# Patient Record
Sex: Female | Born: 1945 | Race: White | Hispanic: No | Smoking: Former smoker
Health system: Southern US, Community
[De-identification: ages and names within clinical notes are randomized; demographics above are authoritative.]

## PROBLEM LIST (undated history)

## (undated) DIAGNOSIS — F329 Major depressive disorder, single episode, unspecified: Secondary | ICD-10-CM

## (undated) DIAGNOSIS — J189 Pneumonia, unspecified organism: Secondary | ICD-10-CM

## (undated) DIAGNOSIS — E66812 Obesity, class 2: Secondary | ICD-10-CM

## (undated) DIAGNOSIS — IMO0002 Reserved for concepts with insufficient information to code with codable children: Secondary | ICD-10-CM

## (undated) DIAGNOSIS — E782 Mixed hyperlipidemia: Secondary | ICD-10-CM

## (undated) DIAGNOSIS — N186 End stage renal disease: Secondary | ICD-10-CM

## (undated) DIAGNOSIS — G629 Polyneuropathy, unspecified: Secondary | ICD-10-CM

## (undated) DIAGNOSIS — Z5189 Encounter for other specified aftercare: Secondary | ICD-10-CM

## (undated) DIAGNOSIS — I509 Heart failure, unspecified: Secondary | ICD-10-CM

## (undated) DIAGNOSIS — E539 Vitamin B deficiency, unspecified: Secondary | ICD-10-CM

## (undated) DIAGNOSIS — G8929 Other chronic pain: Secondary | ICD-10-CM

## (undated) DIAGNOSIS — H269 Unspecified cataract: Secondary | ICD-10-CM

## (undated) DIAGNOSIS — D509 Iron deficiency anemia, unspecified: Secondary | ICD-10-CM

## (undated) DIAGNOSIS — R0600 Dyspnea, unspecified: Secondary | ICD-10-CM

## (undated) DIAGNOSIS — K449 Diaphragmatic hernia without obstruction or gangrene: Secondary | ICD-10-CM

## (undated) DIAGNOSIS — R0902 Hypoxemia: Secondary | ICD-10-CM

## (undated) DIAGNOSIS — E669 Obesity, unspecified: Secondary | ICD-10-CM

## (undated) DIAGNOSIS — D649 Anemia, unspecified: Secondary | ICD-10-CM

## (undated) DIAGNOSIS — I10 Essential (primary) hypertension: Secondary | ICD-10-CM

## (undated) DIAGNOSIS — M810 Age-related osteoporosis without current pathological fracture: Secondary | ICD-10-CM

## (undated) DIAGNOSIS — J449 Chronic obstructive pulmonary disease, unspecified: Secondary | ICD-10-CM

## (undated) DIAGNOSIS — E1129 Type 2 diabetes mellitus with other diabetic kidney complication: Secondary | ICD-10-CM

## (undated) DIAGNOSIS — K589 Irritable bowel syndrome without diarrhea: Secondary | ICD-10-CM

## (undated) DIAGNOSIS — E785 Hyperlipidemia, unspecified: Secondary | ICD-10-CM

## (undated) DIAGNOSIS — T8859XA Other complications of anesthesia, initial encounter: Secondary | ICD-10-CM

## (undated) DIAGNOSIS — I2510 Atherosclerotic heart disease of native coronary artery without angina pectoris: Secondary | ICD-10-CM

## (undated) DIAGNOSIS — I959 Hypotension, unspecified: Secondary | ICD-10-CM

## (undated) DIAGNOSIS — I499 Cardiac arrhythmia, unspecified: Secondary | ICD-10-CM

## (undated) DIAGNOSIS — K219 Gastro-esophageal reflux disease without esophagitis: Secondary | ICD-10-CM

## (undated) DIAGNOSIS — C539 Malignant neoplasm of cervix uteri, unspecified: Secondary | ICD-10-CM

## (undated) DIAGNOSIS — E119 Type 2 diabetes mellitus without complications: Secondary | ICD-10-CM

## (undated) DIAGNOSIS — M5416 Radiculopathy, lumbar region: Secondary | ICD-10-CM

## (undated) DIAGNOSIS — Z992 Dependence on renal dialysis: Secondary | ICD-10-CM

## (undated) DIAGNOSIS — G4733 Obstructive sleep apnea (adult) (pediatric): Secondary | ICD-10-CM

## (undated) DIAGNOSIS — G4730 Sleep apnea, unspecified: Secondary | ICD-10-CM

## (undated) DIAGNOSIS — M549 Dorsalgia, unspecified: Secondary | ICD-10-CM

## (undated) DIAGNOSIS — C569 Malignant neoplasm of unspecified ovary: Secondary | ICD-10-CM

## (undated) DIAGNOSIS — E538 Deficiency of other specified B group vitamins: Secondary | ICD-10-CM

## (undated) DIAGNOSIS — Z8719 Personal history of other diseases of the digestive system: Secondary | ICD-10-CM

## (undated) DIAGNOSIS — N189 Chronic kidney disease, unspecified: Secondary | ICD-10-CM

## (undated) DIAGNOSIS — I480 Paroxysmal atrial fibrillation: Secondary | ICD-10-CM

## (undated) HISTORY — DX: Iron deficiency anemia, unspecified: D50.9

## (undated) HISTORY — DX: Gastro-esophageal reflux disease without esophagitis: K21.9

## (undated) HISTORY — DX: Sleep apnea, unspecified: G47.30

## (undated) HISTORY — DX: Unspecified cataract: H26.9

## (undated) HISTORY — PX: BACK SURGERY: SHX140

## (undated) HISTORY — PX: ABDOMINAL HYSTERECTOMY: SHX81

## (undated) HISTORY — DX: Chronic obstructive pulmonary disease, unspecified: J44.9

## (undated) HISTORY — DX: Atherosclerotic heart disease of native coronary artery without angina pectoris: I25.10

## (undated) HISTORY — PX: KNEE SURGERY: SHX244

## (undated) HISTORY — DX: Vitamin B deficiency, unspecified: E53.9

## (undated) HISTORY — DX: Encounter for other specified aftercare: Z51.89

## (undated) HISTORY — DX: Personal history of other diseases of the digestive system: Z87.19

## (undated) HISTORY — DX: Malignant neoplasm of unspecified ovary: C56.9

## (undated) HISTORY — PX: TONSILLECTOMY AND ADENOIDECTOMY: SUR1326

## (undated) HISTORY — DX: Mixed hyperlipidemia: E78.2

## (undated) HISTORY — DX: Essential (primary) hypertension: I10

## (undated) HISTORY — DX: Type 2 diabetes mellitus without complications: E11.9

## (undated) HISTORY — PX: SPINE SURGERY: SHX786

## (undated) HISTORY — PX: CHOLECYSTECTOMY: SHX55

## (undated) HISTORY — DX: Chronic kidney disease, unspecified: N18.9

## (undated) HISTORY — DX: Hypoxemia: R09.02

## (undated) HISTORY — DX: Deficiency of other specified B group vitamins: E53.8

## (undated) HISTORY — DX: Heart failure, unspecified: I50.9

## (undated) HISTORY — DX: Reserved for concepts with insufficient information to code with codable children: IMO0002

## (undated) HISTORY — DX: Polyneuropathy, unspecified: G62.9

## (undated) HISTORY — PX: OTHER SURGICAL HISTORY: SHX169

## (undated) HISTORY — DX: Age-related osteoporosis without current pathological fracture: M81.0

## (undated) HISTORY — DX: Diaphragmatic hernia without obstruction or gangrene: K44.9

---
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
## 1976-08-05 DIAGNOSIS — C539 Malignant neoplasm of cervix uteri, unspecified: Secondary | ICD-10-CM

## 1976-08-05 DIAGNOSIS — C569 Malignant neoplasm of unspecified ovary: Secondary | ICD-10-CM

## 1976-08-05 HISTORY — PX: PARTIAL HYSTERECTOMY: SHX80

## 1976-08-05 HISTORY — DX: Malignant neoplasm of cervix uteri, unspecified: C53.9

## 1976-08-05 HISTORY — DX: Malignant neoplasm of unspecified ovary: C56.9

## 1999-04-20 ENCOUNTER — Other Ambulatory Visit: Admission: RE | Admit: 1999-04-20 | Discharge: 1999-04-20 | Payer: Self-pay | Admitting: Obstetrics and Gynecology

## 1999-06-21 ENCOUNTER — Ambulatory Visit: Admission: RE | Admit: 1999-06-21 | Discharge: 1999-06-21 | Payer: Self-pay | Admitting: Otolaryngology

## 1999-07-11 ENCOUNTER — Encounter: Payer: Self-pay | Admitting: Otolaryngology

## 1999-07-12 ENCOUNTER — Ambulatory Visit (HOSPITAL_COMMUNITY): Admission: RE | Admit: 1999-07-12 | Discharge: 1999-07-13 | Payer: Self-pay | Admitting: Otolaryngology

## 2000-02-08 ENCOUNTER — Encounter: Payer: Self-pay | Admitting: Family Medicine

## 2000-02-08 ENCOUNTER — Inpatient Hospital Stay (HOSPITAL_COMMUNITY): Admission: EM | Admit: 2000-02-08 | Discharge: 2000-02-11 | Payer: Self-pay | Admitting: Emergency Medicine

## 2000-02-09 ENCOUNTER — Encounter: Payer: Self-pay | Admitting: Emergency Medicine

## 2000-02-13 ENCOUNTER — Encounter: Admission: RE | Admit: 2000-02-13 | Discharge: 2000-02-13 | Payer: Self-pay | Admitting: Sports Medicine

## 2000-03-04 ENCOUNTER — Ambulatory Visit (HOSPITAL_COMMUNITY): Admission: RE | Admit: 2000-03-04 | Discharge: 2000-03-04 | Payer: Self-pay | Admitting: Cardiology

## 2000-04-08 ENCOUNTER — Encounter: Admission: RE | Admit: 2000-04-08 | Discharge: 2000-07-07 | Payer: Self-pay | Admitting: Family Medicine

## 2000-05-23 ENCOUNTER — Encounter: Admission: RE | Admit: 2000-05-23 | Discharge: 2000-05-23 | Payer: Self-pay | Admitting: Family Medicine

## 2000-05-23 ENCOUNTER — Encounter: Payer: Self-pay | Admitting: Family Medicine

## 2000-06-06 ENCOUNTER — Encounter: Admission: RE | Admit: 2000-06-06 | Discharge: 2000-09-04 | Payer: Self-pay | Admitting: Family Medicine

## 2000-06-30 ENCOUNTER — Encounter: Payer: Self-pay | Admitting: Emergency Medicine

## 2000-06-30 ENCOUNTER — Inpatient Hospital Stay (HOSPITAL_COMMUNITY): Admission: EM | Admit: 2000-06-30 | Discharge: 2000-07-02 | Payer: Self-pay | Admitting: Emergency Medicine

## 2000-07-01 ENCOUNTER — Encounter: Payer: Self-pay | Admitting: Internal Medicine

## 2000-08-12 ENCOUNTER — Other Ambulatory Visit: Admission: RE | Admit: 2000-08-12 | Discharge: 2000-08-12 | Payer: Self-pay | Admitting: Family Medicine

## 2000-08-13 ENCOUNTER — Encounter: Admission: RE | Admit: 2000-08-13 | Discharge: 2000-09-15 | Payer: Self-pay | Admitting: Family Medicine

## 2000-08-25 ENCOUNTER — Encounter: Payer: Self-pay | Admitting: Family Medicine

## 2000-08-25 ENCOUNTER — Encounter: Admission: RE | Admit: 2000-08-25 | Discharge: 2000-08-25 | Payer: Self-pay | Admitting: Family Medicine

## 2000-09-08 ENCOUNTER — Encounter: Admission: RE | Admit: 2000-09-08 | Discharge: 2000-12-07 | Payer: Self-pay | Admitting: Anesthesiology

## 2000-10-17 ENCOUNTER — Encounter: Admission: RE | Admit: 2000-10-17 | Discharge: 2001-01-15 | Payer: Self-pay | Admitting: Family Medicine

## 2000-12-19 ENCOUNTER — Encounter: Admission: RE | Admit: 2000-12-19 | Discharge: 2001-03-19 | Payer: Self-pay | Admitting: Anesthesiology

## 2001-03-13 ENCOUNTER — Ambulatory Visit (HOSPITAL_COMMUNITY): Admission: RE | Admit: 2001-03-13 | Discharge: 2001-03-13 | Payer: Self-pay | Admitting: Gastroenterology

## 2001-08-01 ENCOUNTER — Encounter: Payer: Self-pay | Admitting: Emergency Medicine

## 2001-08-01 ENCOUNTER — Inpatient Hospital Stay (HOSPITAL_COMMUNITY): Admission: EM | Admit: 2001-08-01 | Discharge: 2001-08-04 | Payer: Self-pay | Admitting: Emergency Medicine

## 2001-08-02 ENCOUNTER — Encounter: Payer: Self-pay | Admitting: General Surgery

## 2001-09-01 ENCOUNTER — Other Ambulatory Visit: Admission: RE | Admit: 2001-09-01 | Discharge: 2001-09-01 | Payer: Self-pay | Admitting: Family Medicine

## 2002-07-04 ENCOUNTER — Encounter: Payer: Self-pay | Admitting: Emergency Medicine

## 2002-07-04 ENCOUNTER — Emergency Department (HOSPITAL_COMMUNITY): Admission: EM | Admit: 2002-07-04 | Discharge: 2002-07-04 | Payer: Self-pay | Admitting: Emergency Medicine

## 2003-03-16 ENCOUNTER — Other Ambulatory Visit: Admission: RE | Admit: 2003-03-16 | Discharge: 2003-03-16 | Payer: Self-pay | Admitting: Family Medicine

## 2003-04-07 ENCOUNTER — Encounter: Admission: RE | Admit: 2003-04-07 | Discharge: 2003-04-07 | Payer: Self-pay | Admitting: Family Medicine

## 2003-04-07 ENCOUNTER — Encounter: Payer: Self-pay | Admitting: Family Medicine

## 2004-01-27 ENCOUNTER — Encounter: Admission: RE | Admit: 2004-01-27 | Discharge: 2004-01-27 | Payer: Self-pay | Admitting: Family Medicine

## 2004-02-07 ENCOUNTER — Encounter: Admission: RE | Admit: 2004-02-07 | Discharge: 2004-02-07 | Payer: Self-pay | Admitting: Family Medicine

## 2004-07-21 ENCOUNTER — Emergency Department (HOSPITAL_COMMUNITY): Admission: EM | Admit: 2004-07-21 | Discharge: 2004-07-21 | Payer: Self-pay | Admitting: Family Medicine

## 2004-07-23 ENCOUNTER — Ambulatory Visit (HOSPITAL_COMMUNITY): Admission: RE | Admit: 2004-07-23 | Discharge: 2004-07-23 | Payer: Self-pay | Admitting: Emergency Medicine

## 2004-07-24 ENCOUNTER — Ambulatory Visit: Payer: Self-pay | Admitting: Critical Care Medicine

## 2004-10-03 ENCOUNTER — Ambulatory Visit: Payer: Self-pay | Admitting: Critical Care Medicine

## 2004-10-19 ENCOUNTER — Encounter: Admission: RE | Admit: 2004-10-19 | Discharge: 2004-10-19 | Payer: Self-pay | Admitting: Critical Care Medicine

## 2005-04-29 ENCOUNTER — Ambulatory Visit: Payer: Self-pay | Admitting: Internal Medicine

## 2005-06-29 ENCOUNTER — Emergency Department (HOSPITAL_COMMUNITY): Admission: AD | Admit: 2005-06-29 | Discharge: 2005-06-29 | Payer: Self-pay | Admitting: Family Medicine

## 2006-06-17 ENCOUNTER — Ambulatory Visit: Payer: Self-pay | Admitting: Cardiology

## 2006-06-20 ENCOUNTER — Ambulatory Visit: Payer: Self-pay | Admitting: Cardiology

## 2006-07-16 ENCOUNTER — Ambulatory Visit: Payer: Self-pay | Admitting: *Deleted

## 2006-08-05 HISTORY — PX: OTHER SURGICAL HISTORY: SHX169

## 2006-08-27 ENCOUNTER — Ambulatory Visit: Payer: Self-pay | Admitting: Critical Care Medicine

## 2006-09-03 ENCOUNTER — Ambulatory Visit: Payer: Self-pay | Admitting: Gastroenterology

## 2006-10-01 ENCOUNTER — Ambulatory Visit (HOSPITAL_COMMUNITY): Admission: RE | Admit: 2006-10-01 | Discharge: 2006-10-01 | Payer: Self-pay | Admitting: Gastroenterology

## 2006-10-01 ENCOUNTER — Encounter (INDEPENDENT_AMBULATORY_CARE_PROVIDER_SITE_OTHER): Payer: Self-pay | Admitting: Specialist

## 2006-10-01 ENCOUNTER — Ambulatory Visit: Payer: Self-pay | Admitting: Gastroenterology

## 2006-10-01 HISTORY — PX: ESOPHAGOGASTRODUODENOSCOPY: SHX1529

## 2006-10-01 HISTORY — PX: COLONOSCOPY: SHX174

## 2006-10-08 ENCOUNTER — Ambulatory Visit: Payer: Self-pay | Admitting: Cardiology

## 2006-10-14 ENCOUNTER — Ambulatory Visit: Payer: Self-pay | Admitting: Gastroenterology

## 2006-10-22 ENCOUNTER — Ambulatory Visit: Payer: Self-pay | Admitting: Gastroenterology

## 2006-11-19 ENCOUNTER — Ambulatory Visit: Payer: Self-pay | Admitting: Gastroenterology

## 2006-11-19 ENCOUNTER — Ambulatory Visit (HOSPITAL_COMMUNITY): Admission: RE | Admit: 2006-11-19 | Discharge: 2006-11-19 | Payer: Self-pay | Admitting: Gastroenterology

## 2006-11-19 HISTORY — PX: ESOPHAGOGASTRODUODENOSCOPY: SHX1529

## 2006-11-27 ENCOUNTER — Ambulatory Visit: Payer: Self-pay | Admitting: Gastroenterology

## 2006-12-11 ENCOUNTER — Ambulatory Visit: Payer: Self-pay | Admitting: Critical Care Medicine

## 2006-12-17 ENCOUNTER — Ambulatory Visit: Payer: Self-pay | Admitting: Cardiology

## 2006-12-26 ENCOUNTER — Ambulatory Visit: Payer: Self-pay | Admitting: Cardiology

## 2007-01-08 ENCOUNTER — Ambulatory Visit: Payer: Self-pay | Admitting: Cardiology

## 2007-01-27 ENCOUNTER — Ambulatory Visit: Payer: Self-pay | Admitting: Critical Care Medicine

## 2007-01-27 ENCOUNTER — Inpatient Hospital Stay (HOSPITAL_COMMUNITY): Admission: RE | Admit: 2007-01-27 | Discharge: 2007-02-03 | Payer: Self-pay | Admitting: Neurosurgery

## 2007-01-29 ENCOUNTER — Ambulatory Visit: Payer: Self-pay | Admitting: Physical Medicine & Rehabilitation

## 2007-07-10 ENCOUNTER — Ambulatory Visit: Payer: Self-pay | Admitting: Cardiology

## 2008-08-05 HISTORY — PX: UMBILICAL HERNIA REPAIR: SHX196

## 2008-09-28 ENCOUNTER — Encounter: Admission: RE | Admit: 2008-09-28 | Discharge: 2008-09-28 | Payer: Self-pay | Admitting: General Surgery

## 2008-10-27 ENCOUNTER — Ambulatory Visit (HOSPITAL_COMMUNITY): Admission: RE | Admit: 2008-10-27 | Discharge: 2008-10-28 | Payer: Self-pay | Admitting: General Surgery

## 2009-08-05 HISTORY — PX: ESOPHAGOGASTRODUODENOSCOPY: SHX1529

## 2009-08-05 HISTORY — PX: COLONOSCOPY: SHX174

## 2009-12-25 DIAGNOSIS — E785 Hyperlipidemia, unspecified: Secondary | ICD-10-CM | POA: Insufficient documentation

## 2009-12-25 DIAGNOSIS — I10 Essential (primary) hypertension: Secondary | ICD-10-CM | POA: Insufficient documentation

## 2009-12-25 DIAGNOSIS — Z8719 Personal history of other diseases of the digestive system: Secondary | ICD-10-CM | POA: Insufficient documentation

## 2009-12-25 DIAGNOSIS — R1013 Epigastric pain: Secondary | ICD-10-CM

## 2009-12-25 DIAGNOSIS — E539 Vitamin B deficiency, unspecified: Secondary | ICD-10-CM | POA: Insufficient documentation

## 2009-12-25 DIAGNOSIS — IMO0002 Reserved for concepts with insufficient information to code with codable children: Secondary | ICD-10-CM | POA: Insufficient documentation

## 2009-12-25 DIAGNOSIS — D500 Iron deficiency anemia secondary to blood loss (chronic): Secondary | ICD-10-CM | POA: Insufficient documentation

## 2009-12-25 DIAGNOSIS — K3189 Other diseases of stomach and duodenum: Secondary | ICD-10-CM | POA: Insufficient documentation

## 2009-12-25 DIAGNOSIS — Z8739 Personal history of other diseases of the musculoskeletal system and connective tissue: Secondary | ICD-10-CM | POA: Insufficient documentation

## 2009-12-25 DIAGNOSIS — Z9989 Dependence on other enabling machines and devices: Secondary | ICD-10-CM

## 2009-12-25 DIAGNOSIS — M9979 Connective tissue and disc stenosis of intervertebral foramina of abdomen and other regions: Secondary | ICD-10-CM | POA: Insufficient documentation

## 2009-12-25 DIAGNOSIS — G4733 Obstructive sleep apnea (adult) (pediatric): Secondary | ICD-10-CM | POA: Insufficient documentation

## 2009-12-25 DIAGNOSIS — E1142 Type 2 diabetes mellitus with diabetic polyneuropathy: Secondary | ICD-10-CM | POA: Insufficient documentation

## 2009-12-25 DIAGNOSIS — E1122 Type 2 diabetes mellitus with diabetic chronic kidney disease: Secondary | ICD-10-CM | POA: Insufficient documentation

## 2009-12-25 HISTORY — DX: Vitamin B deficiency, unspecified: E53.9

## 2009-12-26 ENCOUNTER — Ambulatory Visit: Payer: Self-pay | Admitting: Gastroenterology

## 2009-12-26 DIAGNOSIS — J449 Chronic obstructive pulmonary disease, unspecified: Secondary | ICD-10-CM | POA: Insufficient documentation

## 2009-12-26 DIAGNOSIS — K921 Melena: Secondary | ICD-10-CM | POA: Insufficient documentation

## 2009-12-26 DIAGNOSIS — R1310 Dysphagia, unspecified: Secondary | ICD-10-CM | POA: Insufficient documentation

## 2010-01-03 ENCOUNTER — Ambulatory Visit: Payer: Self-pay | Admitting: Gastroenterology

## 2010-01-03 ENCOUNTER — Ambulatory Visit (HOSPITAL_COMMUNITY): Admission: RE | Admit: 2010-01-03 | Discharge: 2010-01-03 | Payer: Self-pay | Admitting: Gastroenterology

## 2010-08-26 ENCOUNTER — Encounter: Payer: Self-pay | Admitting: Critical Care Medicine

## 2010-09-04 NOTE — Assessment & Plan Note (Signed)
Summary: PT NEEDS TCS/EGD FOR IDA/CM   Visit Type:  Initial Consult Referring Provider:  Darovsky Primary Care Provider:  Jeanie Cooks  Chief Complaint:  IDA.  History of Present Illness: Hx pernicious and iron deficiency anemia, referred by Dr Jacquiline Doe for re-evaluation. Pt had previous evaluation back in 2008 by Dr. Oneida Alar. Off B-12 injections for over 1 year, just started back last wk.  Iron tabs once daily started 3 months ago.  Labwork [facility] in [place] couple weeks ago, but I do not have results.  c/o fatigue.  Denies nausea, vomiting.  Occ diarrhea 5-6 loose stools/day w/ some mucous.  c/o low grade fever w/ bad diarrhea.  A couple months without diarrhea, then diarrhea for couple days.  c/o heartburn/indigestion well-controlled on Nexium, occ takes two times a day q1-2 weeks.  c/ dysphagia w/ both solids and liquids, feels like stuck in upper esophagus.  Weight stable.  Appetite ok.  On ASA 81mg  daily, occ Goody's(1 per mo), Aleve(almost daily), and ASA(1-2/week) for arthritis.  Has noticed small volume intermittant hematochezia w/ wiping on toilet tissue.   Current Problems (verified): 1)  Hematochezia  (ICD-578.1) 2)  COPD  (ICD-496) 3)  Obesity  (ICD-278.00) 4)  Degenerative Disc Disease  (ICD-722.6) 5)  Arthritis, Hx of  (ICD-V13.4) 6)  Vitamin B12 Deficiency  (ICD-266.2) 7)  Anemia, Iron Deficiency  (ICD-280.9) 8)  Sleep Apnea  (ICD-780.57) 9)  Hypertension  (ICD-401.9) 10)  Hyperlipidemia, Mild, Hx of  (ICD-V13.8) 11)  Dm  (ICD-250.00) 12)  Hiatal Hernia, Hx of  (ICD-V12.79) 13)  Diverticulitis, Hx of  (ICD-V12.79) 14)  Dysphagia Unspecified  (ICD-787.20) 15)  Dyspepsia  (ICD-536.8)  Current Medications (verified): 1)  Advair Diskus 250-50 Mcg/dose Aepb (Fluticasone-Salmeterol) .... As Directed 2)  Vytorin 10-80 Mg Tabs (Ezetimibe-Simvastatin) .... Take 1 Tablet By Mouth Once A Day 3)  Glimepiride 2 Mg Tabs (Glimepiride) .... Once Daily in The Am 4)  Nexium 40 Mg Cpdr  (Esomeprazole Magnesium) .... Take 1 Tablet By Mouth Once A Day 5)  Diovan 160 Mg Tabs (Valsartan) .... Take 1 Tablet By Mouth Once A Day 6)  Furosemide 40 Mg Tabs (Furosemide) .... Take 1 Tablet By Mouth Once A Day 7)  Amitriptyline Hcl 50 Mg Tabs (Amitriptyline Hcl) .... Take 1 Tablet By Mouth Once A Day 8)  Nabumetone 750 Mg Tabs (Nabumetone) .... Take 1 Tablet By Mouth Two Times A Day 9)  Iron 27 Mg .... Take 1 Tablet By Mouth Once A Day 10)  Aspir-Low 81 Mg Tbec (Aspirin) .... Take 1 Tablet By Mouth Once A Day 11)  Osygen 2l .... Use All Night and Daily As Needed 12)  Duoneb 0.5-2.5 (3) Mg/43ml Soln (Ipratropium-Albuterol) .... As Directed 13)  Albuterol For Inhaler .... As Directed 14)  B 12 Injections .... Once Weekly X 3 Weeks Then Once Monthly 15)  Aleve 220 Mg Tabs (Naproxen Sodium) .... As Needed 16)  Ear Drops Otc .... As Needed 17)  Goodys Extra Strength 260-130-16.25 Mg Tabs (Aspirin-Acetaminophen-Caffeine) .... Prn 18)  Aleve 220 Mg Tabs (Naproxen Sodium) .Marland Kitchen.. 1 By Mouth Daily As Needed Arthritis 19)  Ecotrin 325 Mg Tbec (Aspirin) .... As Needed Ha  Allergies (verified): No Known Drug Allergies  Past History:  Past Medical History: Previous GI  Work-up 11/2006 Normal SB [facility] EGD w/ Savory dilation 11/2006 large HH, distal esophageal stricture-->24mm 09/2006 colonoscopy->diverticulosis, negative SB BX COPD (ICD-496) OBESITY (ICD-278.00) DEGENERATIVE DISC DISEASE (ICD-722.6) ARTHRITIS, HX OF (ICD-V13.4) VITAMIN B12 DEFICIENCY (ICD-266.2) ANEMIA,  IRON DEFICIENCY (ICD-280.9)  SLEEP APNEA (ICD-780.57) HYPERTENSION (ICD-401.9) HYPERLIPIDEMIA, MILD, HX OF (ICD-V13.8) DM (ICD-250.00) HIATAL HERNIA, HX OF (ICD-V12.79) DIVERTICULITIS, HX OF (ICD-V12.79) DYSPHAGIA UNSPECIFIED (ICD-787.20) DYSPEPSIA (ICD-536.8)  Past Surgical History: Cholecystectomy Partial Hysterectomy tonsils/adenoids umbilical hernia repair benign breast cysts 2 back surgeries/fusion  Family  History: 1 brother dx w/ colon ca age 43 1 sister PUD Mother (deceased 85) PUD, DM, MI, CVA Father (deceased late 66's) MVA Multiples siblings-MI, DM, CVA, cervical ca  Social History: married (5 yrs) 4 sons-grown, healthy Patient has never smoked.  Alcohol Use - no Illicit Drug Use - no disabledSmoking Status:  never Drug Use:  no  Review of Systems General:  Complains of fever, fatigue, weakness, malaise, and sleep disorder; denies chills, sweats, anorexia, and weight loss. CV:  Denies chest pains, angina, palpitations, syncope, dyspnea on exertion, orthopnea, PND, peripheral edema, and claudication. Resp:  Denies dyspnea at rest, dyspnea with exercise, cough, sputum, wheezing, coughing up blood, and pleurisy. GI:  Denies pain on swallowing, jaundice, bloody BM's, black BMs, and fecal incontinence. GU:  Complains of urinary incontinence; denies urinary burning, blood in urine, nocturnal urination, and urinary frequency; occ. MS:  Complains of joint pain / LOM and low back pain; denies joint swelling, joint stiffness, joint deformity, muscle weakness, muscle cramps, muscle atrophy, leg pain at night, leg pain with exertion, and shoulder pain / LOM hand / wrist pain (CTS). Derm:  Complains of dry skin; denies rash, itching, hives, moles, warts, and unhealing ulcers. Psych:  Denies depression, anxiety, memory loss, suicidal ideation, hallucinations, paranoia, phobia, and confusion. Heme:  Denies bruising, bleeding, and enlarged lymph nodes.  Vital Signs:  Patient profile:   65 year old female Height:      59 inches Weight:      240 pounds BMI:     48.65 Temp:     98.6 degrees F oral Pulse rate:   96 / minute BP sitting:   122 / 62  (left arm) Cuff size:   regular  Vitals Entered By: Waldon Merl LPN (May 24, 624THL 624THL AM)  Physical Exam  General:  Well developed, well nourished, no acute distress.obese.   Head:  Normocephalic and atraumatic. Eyes:  Sclera clear, no  icterus. Ears:  Normal auditory acuity. Nose:  No deformity, discharge,  or lesions. Mouth:  No deformity or lesions, dentition normal. Neck:  Supple; no masses or thyromegaly. Lungs:  Clear throughout to auscultation. Heart:  Regular rate and rhythm; no murmurs, rubs,  or bruits. Abdomen:  Soft, nontender and nondistended. No masses, hepatosplenomegaly or hernias noted. Normal bowel sounds.without guarding and without rebound.  Exam limited given pt's body habitus. Rectal:  deferred until time of colonoscopy.   Msk:  Symmetrical with no gross deformities. Normal posture. Pulses:  Normal pulses noted. Extremities:  No clubbing, cyanosis, edema or deformities noted. Neurologic:  Alert and  oriented x4;  grossly normal neurologically. Skin:  Intact without significant lesions or rashes. Cervical Nodes:  No significant cervical adenopathy. Axillary Nodes:  No significant axillary adenopathy. Inguinal Nodes:  No significant inguinal adenopathy. Psych:  Alert and cooperative. Normal mood and affect.  Impression & Recommendations:  Problem # 1:  ANEMIA, IRON DEFICIENCY (ICD-280.9)  65 y/o caucasian female w/ hx pernicious/iron deficiency anemia presents w/ persistent anemia after absence of by mouth iron & B-12 injections for quite some time.  She has recurrent esophageal dysphagia and intermittant hematachezia which need to be further investigated.  Need to determine source of hematochezia, ?esophageal  stricture, r/o malignancy or NSAID-induced enteropathy.  May need IV iron.  ? h pylori status.  Diagnostic colonoscopy and EGD with possible esophageal dilatation to be performed by Dr. Barney Drain in the near future.  I have discussed risks and benefits which include, but are not limited to, bleeding, infection, perforation, or medication reaction.  The patient agrees with this plan and consent will be obtained.  Orders: Consultation Level III [id number])  Problem # 2:  DYSPHAGIA UNSPECIFIED  (ICD-787.20) Recurrent, as above  Problem # 3:  VITAMIN B12 DEFICIENCY (ICD-266.2)  See #1  Orders: Consultation Level III [id number])  Problem # 4:  HEMATOCHEZIA (ICD-578.1)  See #1  Orders: Consultation Level III [id number])   Patient Instructions: 1)  Hold iron x 7 days prior to procedure 2)  Hold aspirin for 3 days before procedure 3)  Avoid Aleve, Goodys, and any other aspirin products other than daily 81mg  Aspirin 4)  Continue two times a day Nexium 40mg  5)  may need IV iron 6)  we requested labs Dr Al Decant office 7)  Continue B-12 injections  Appended Document: PT NEEDS TCS/EGD FOR IDA/CM 12/06/09 labs ALP 103 AST 49 otherwise normal LFTS  iron 62, %sat 15, TIBC 420, ferritin 11, B-12 144, Hgb 9.3, Hct 29 MCV 73.1

## 2010-09-04 NOTE — Letter (Signed)
Summary: TCS/EGD ORDER  TCS/EGD ORDER   Imported By: Sofie Rower 12/26/2009 12:25:53  _____________________________________________________________________  External Attachment:    Type:   Image     Comment:   External Document

## 2010-10-22 LAB — GLUCOSE, CAPILLARY: Glucose-Capillary: 170 mg/dL — ABNORMAL HIGH (ref 70–99)

## 2010-11-15 LAB — DIFFERENTIAL
Basophils Absolute: 0.1 10*3/uL (ref 0.0–0.1)
Basophils Relative: 1 % (ref 0–1)
Eosinophils Absolute: 0.5 10*3/uL (ref 0.0–0.7)
Eosinophils Relative: 5 % (ref 0–5)
Lymphocytes Relative: 26 % (ref 12–46)
Lymphs Abs: 2.5 10*3/uL (ref 0.7–4.0)
Monocytes Absolute: 0.6 10*3/uL (ref 0.1–1.0)
Monocytes Relative: 7 % (ref 3–12)
Neutro Abs: 5.9 10*3/uL (ref 1.7–7.7)
Neutrophils Relative %: 62 % (ref 43–77)

## 2010-11-15 LAB — GLUCOSE, CAPILLARY
Glucose-Capillary: 150 mg/dL — ABNORMAL HIGH (ref 70–99)
Glucose-Capillary: 167 mg/dL — ABNORMAL HIGH (ref 70–99)
Glucose-Capillary: 283 mg/dL — ABNORMAL HIGH (ref 70–99)

## 2010-11-15 LAB — COMPREHENSIVE METABOLIC PANEL
ALT: 54 U/L — ABNORMAL HIGH (ref 0–35)
AST: 71 U/L — ABNORMAL HIGH (ref 0–37)
Albumin: 3.7 g/dL (ref 3.5–5.2)
Alkaline Phosphatase: 103 U/L (ref 39–117)
BUN: 16 mg/dL (ref 6–23)
CO2: 24 mEq/L (ref 19–32)
Calcium: 8.9 mg/dL (ref 8.4–10.5)
Chloride: 108 mEq/L (ref 96–112)
Creatinine, Ser: 1.04 mg/dL (ref 0.4–1.2)
GFR calc Af Amer: >60 mL/min (ref >60)
GFR calc non Af Amer: 54 mL/min — ABNORMAL LOW (ref >60)
Glucose, Bld: 90 mg/dL (ref 70–99)
Potassium: 4.7 mEq/L (ref 3.5–5.1)
Sodium: 140 mEq/L (ref 135–145)
Total Bilirubin: 0.8 mg/dL (ref 0.3–1.2)
Total Protein: 6.6 g/dL (ref 6.0–8.3)

## 2010-11-15 LAB — CBC
HCT: 35.4 % — ABNORMAL LOW (ref 36.0–46.0)
Hemoglobin: 11.6 g/dL — ABNORMAL LOW (ref 12.0–15.0)
MCHC: 32.7 g/dL (ref 30.0–36.0)
MCV: 80.7 fL (ref 78.0–100.0)
Platelets: 294 10*3/uL (ref 150–400)
RBC: 4.39 MIL/uL (ref 3.87–5.11)
RDW: 16 % — ABNORMAL HIGH (ref 11.5–15.5)
WBC: 9.6 10*3/uL (ref 4.0–10.5)

## 2010-11-15 LAB — PROTIME-INR
INR: 1 (ref 0.00–1.49)
Prothrombin Time: 13.3 seconds (ref 11.6–15.2)

## 2010-12-18 NOTE — Op Note (Signed)
[name], CARROL            ACCOUNT NO.:  [id number]   MEDICAL RECORD NO.:  [id number]          PATIENT TYPE:  INP   LOCATION:  3031                         FACILITY:  [place]   PHYSICIAN:  Leeroy Cha, M.D.   DATE OF BIRTH:  1946/04/08   DATE OF PROCEDURE:  01/27/2007  DATE OF DISCHARGE:                               OPERATIVE REPORT   PREOPERATIVE DIAGNOSES:  1. L4-5 spondylolisthesis with L3-4, L4-5, and L5-S1 stenoses, status      post laminectomy 4 years ago at [facility].  2. Chronic radiculopathy.   POSTOPERATIVE DIAGNOSES:  1. L4-5 spondylolisthesis with L3-4, L4-5, and L5-S1 stenoses, status      post laminectomy 4 years ago at [facility].  2. Chronic radiculopathy.   PROCEDURE:  1. Bilateral L3-4, L4-5 laminectomy.  2. Bilateral L4-5 diskectomy.  3. Facetectomy.  4. Interbody fusion with cage.  5. Pedicle screws at L4-5.  6. Posterolateral arthrodesis.  7. Cell Saver; C-arm.   SURGEON:  Leeroy Cha, M.D.   ASSISTANT:  Hosie Spangle, M.D.   HISTORY:  The patient was admitted because of back radiating to both  legs.  The patient previously had surgery at [facility] where she  had a laminectomy for years ago.  X-rays show stenosis with  spondylolisthesis at the level of L4-5.  Surgery was advised.   PROCEDURE:  The patient was taken to the OR and she was positioned in  prone manner.  The skin was cleaned with DuraPrep.  We started at the  midline.  I was unable to feel any bone whatsoever.  Incision was  carried out through the skin, subcutaneous tissue and through a thick  adipose tissue, down to we found the spinous processes of L2.  From then  on we went laterally and we were able to feel the facet, as well as the  lamina of L3, L4 and L5.  Using the drill, as well as the Kerrison  punch, we did a laminectomy of L3, L4 and L5.  I x-rayed the L4-5, and  we proceeded with removal of facet of L4.  The patient had a lot of  adipose tissue, right worse than left one.  Lysis was accomplished and  at the end we were able to visualize well the thecal sac, as well as the  L4 and L5 nerve roots bilaterally.  We entered the disk space, first in  the right side where the problem was worse with adipose scar tissue; and  total diskectomy was achieved.  The same procedure was done on the left  side.  Then the endplate was removed and 2 cages of 10 x 22 were  inserted with bone graft in between.  The rest of the disk space was  filled up with bone graft.  From then on, using the C-arm, first in the  AP view and later on the lateral view, we identified the pedicles at L4  and L5.  The pedicle probe was drilled and 4 pedicle screws (2 of them  of 5.5 x 50 at the level of L4 and 2 of them 5.5 x  4 at the level  __________  introduced), and kept in place with a rod and caps..  Again,  x-rays showed  good position of the pedicle screws.  From then on we went laterally,  and we removed the periosteum of L4 and L5 transverse processes, as well  the lateral facets.  Autograft was used for arthrodesis.  The area was  irrigated.  Valsalva maneuver was negative.  Then the wound was closed  with Vicryl and nylon.           ______________________________  Leeroy Cha, M.D.     EB/MEDQ  D:  01/27/2007  T:  01/27/2007  Job:  [id number]

## 2010-12-18 NOTE — Discharge Summary (Signed)
[name], [name]            ACCOUNT NO.:  [id number]   MEDICAL RECORD NO.:  [id number]          PATIENT TYPE:  INP   LOCATION:  3031                         FACILITY:  [place]   PHYSICIAN:  Leeroy Cha, M.D.   DATE OF BIRTH:  [birth year]   DATE OF ADMISSION:  01/27/2007  DATE OF DISCHARGE:  02/03/2007                               DISCHARGE SUMMARY   ADMISSION DIAGNOSES:  1. Lumbar stenosis with spondylolisthesis, status post previous      laminectomy in 2004 at [facility].  2. Diabetes mellitus.  3. Chronic obstructive pulmonary disease.   FINAL DIAGNOSES:  1. Lumbar stenosis with spondylolisthesis, status post previous      laminectomy in 2004 at [facility].  2. Diabetes mellitus.  3. Chronic obstructive pulmonary disease.   HISTORY:  The patient was admitted because of back pain reaching both  her legs.  X-ray showed stenosis of the lumbar area.  Patient had  previous surgery at [facility].  Surgery was advised.  Laboratory  at the present time:  The potassium 3.6, sodium 135, chloride  97,  glucose 141, BUN 24.  Chest x-ray showed atelectasis.  That was done  five days ago.  Urine negative.   HOSPITAL COURSE:  The patient was taken to surgery and decompression as  well as a fusion was done.  The patient had quite a bit of difficulty  getting around.  She has a history of sleep apnea.  She developed  fevers.  She was seen by the pulmonologist.  Treatment was given and she  has been afebrile for 72 hours.  Today she is ready to go home after  being seen by Dr. Asencion Noble from the pulmonology center.   CONDITION ON DISCHARGE:  Improving.   DISCHARGE MEDICATIONS:  Percocet for pain.  She is going to continue  taking her previous medications.   DIET:  Diabetic diet.  She was encouraged to lose weight.   ACTIVITY:  Not to drive.   FOLLOW UP:  She is going to be seen by me in four weeks or before.           ______________________________  Leeroy Cha, M.D.     EB/MEDQ  D:  02/03/2007  T:  02/03/2007  Job:  [id number]

## 2010-12-18 NOTE — H&P (Signed)
[name], [name]            ACCOUNT NO.:  [id number]   MEDICAL RECORD NO.:  [id number]          PATIENT TYPE:  INP   LOCATION:  3172                         FACILITY:  [place]   PHYSICIAN:  Leeroy Cha, M.D.   DATE OF BIRTH:  1946/08/02   DATE OF ADMISSION:  01/27/2007  DATE OF DISCHARGE:                              HISTORY & PHYSICAL   Ms. Koelbl is a lady who came to my office complaining of back pain with  radiation to both legs, left worse the right one, which had been going  on for several years.  This lady, in 2004, had some type of lumbar  decompression [facility] and she did well for several months, but  later on she got worse.  Prior to surgery, she was told that she might  require further surgery.  The patient is quite miserable.  She tells me  that the only time she gets some relief is when she sits tight or lying  down in bed.   PAST MEDICAL HISTORY:  1. Decompressive laminectomy at [facility] in 2004.  2. Cholecystectomy.  3. Tonsillectomy.   ALLERGIES:  She is not allergic to any medications.   SOCIAL HISTORY:  Negative.  She is 5 feet tall and 230 pounds.   FAMILY HISTORY:  Her mother died at the age of 27 with heart disease.   REVIEW OF SYSTEMS:  Positive for high blood pressure, high cholesterol,  indigestion, anemia and diabetes.   PHYSICAL EXAMINATION:  GENERAL APPEARANCE:  Patient came to my office  walking with a short step and limping from the left leg.  HEENT:  Normal.  NECK:  Normal.  LUNGS:  There is some rhonchi bilaterally.  There is some wheezing  bilaterally.  CARDIOVASCULAR:  Normal.  ABDOMEN:  I cannot detect any masses.  EXTREMITIES:  There is a grade 1 edema in the lower extremities.  NEUROLOGIC:  She has a weakness on dorsiflexion of the left foot as well  as plantar flexion.  Reflexes symmetrical with absence of the left ankle  jerk.  Straight leg raising is positive at 45 degrees on the right side  and left side of  30 degrees.  She had decreased flexibility of the  lumbar spine.   The x-rays showed that she has laminectomy defect at the level 3, 4, 5  with overgrowth of the joint at posterior level.  She has  spondylolisthesis at the level of 4-5 which gets worse with extension.  The MRI showed degenerative disk disease with facet arthropathy at the  level of 3-4 with spondylolisthesis at the level of 4-5.   IMPRESSION:  Chronic back pain with prior spondylolisthesis at the level  of 4-5 and degenerative disk disease at the level of 3-4 and 5-1.   RECOMMENDATION:  We talked about conservative treatment.  At the end,  all of [place], we agreed __________.  The procedure will be L4-5 diskectomy,  facetectomy, interbody fusion with cages and pedicle screws.  Also we  are  going to decompress the area of the level of 3-4 and 5-1.  The surgery  was explained  to both of them including the possibility of no  improvement whatsoever, need for further surgery, infection, CSF leak,  damage to the base of the abdomen, all the risk associated with diabetes  and anemia and obesity.           ______________________________  Leeroy Cha, M.D.     EB/MEDQ  D:  01/27/2007  T:  01/27/2007  Job:  [id number]

## 2010-12-18 NOTE — Assessment & Plan Note (Signed)
[place]                             PULMONARY OFFICE NOTE   NAME:[name]                   MRN:          [id number]  DATE:12/11/2006                            DOB:          12/30/1945    This is a 65 year old white female with history of asthmatic bronchitis,  right lower lobe lung nodule, that has been stable since 2005. This  patient is noted to have increased shortness of breath, bronchitis and  pneumonia, admitted for pneumonia in [facility] in March, 2008.  Since that time she has done better and her Spiriva was switched to  Advair 250/150 one spray b.i.d.  She maintains CPAP 8 cm h.s., Vytorin  10/40 daily, Diovan HCT 160/25 daily, Nexium 40 mg b.i.d., Meloxicam 7.5  mg daily, amitriptyline 50 mg daily, benzonatate 200 mg t.i.d.,  loratadine 10 mg daily, Nabumetone 750 mg b.i.d., Caltrate daily,  aspirin 81 mg daily.   PHYSICAL EXAMINATION:  VITAL SIGNS:  Temperature 98, blood pressure  148/82, pulse 97, saturation was 96% on room air.  CHEST:  Showed distant breath sounds with prolonged expiratory phase, no  wheeze or rhonchi were noted.  CARDIAC EXAM:  Showed a regular rate and rhythm without S3, normal S1,  S2.  ABDOMEN:  Soft, nontender.  EXTREMITIES:  Showed no edema or clubbing.  SKIN:  Clear.   IMPRESSION:  Chronic obstructive pulmonary disease, asthmatic bronchitic  component and morbid obesity.   PLAN:  Maintain inhaled medicines with Advair as is. No change in plan  of care otherwise is made. Will see the patient back in 4 months.     Rita Harry Joya Gaskins, MD, [facility]  Electronically Signed    PEW/MedQ  DD: 12/11/2006  DT: 12/11/2006  Job #: [id number]   cc:   Duke Salvia, M.D.

## 2010-12-18 NOTE — Assessment & Plan Note (Signed)
[facility] OFFICE NOTE   NAME:[name], [name]                   MRN:          [id number]  DATE:07/10/2007                            DOB:          May 20, 1946    HISTORY OF PRESENT ILLNESS:  The patient is a 65 year old female with  nonobstructive coronary artery disease by catheterization in 2000 and  nonischemic Myoview in November of 2007.  The patient was seen for  preoperative consultation prior to back surgery and to be cleared from a  cardiovascular perspective.  The patient underwent in July her back  surgery and this was complicated by pneumothorax but there were no  cardiovascular complications.  She is doing well.  She denies any  stress, pressure or shortness of breath, orthopnea, or PND.  She  presents for routine followup visit.   MEDICATIONS:  See chart.   PHYSICAL EXAMINATION:  VITAL SIGNS:  Blood pressure 124/80, heart rate  108, weight 221 pounds.  NECK EXAM:  Normal carotid upstrokes, no carotid bruits.  LUNGS:  Clear.  HEART:  Regular rate and rhythm with normal S1, S2.  ABDOMEN:  Soft.  EXTREMITIES:  No cyanosis, clubbing or edema.   PROBLEM LIST:  1. Nonobstructive coronary artery disease.  2. Chronic dyspnea with exertion, multifactorial (obesity and COPD, as      well as deconditioning).  3. Diabetes mellitus.  4. Chronic obstructive pulmonary disease.  5. Status post back surgery.  6. Obstructive sleep apnea.  7. Hypertension.  8. Dyslipidemia.  9. History of anemia.   PLAN:  The patient is doing well from a cardiovascular perspective.  We  have no specific recommendations during this office visit.  The patient  can follow up with [place] in one year.     Ernestine Mcmurray, MD,FACC  Electronically Signed    GED/MedQ  DD: 07/10/2007  DT: 07/10/2007  Job #: [id number]

## 2010-12-18 NOTE — Consult Note (Signed)
[name], [name]            ACCOUNT NO.:  [id number]   MEDICAL RECORD NO.:  [id number]          PATIENT TYPE:  INP   LOCATION:  3031                         FACILITY:  [place]   PHYSICIAN:  Burnett Harry. Joya Gaskins, MD, FCCPDATE OF BIRTH:  1945-11-[birth year]   DATE OF CONSULTATION:  01/30/2007  DATE OF DISCHARGE:                                 CONSULTATION   CHIEF COMPLAINT:  Fever, atelectasis.   HISTORY OF PRESENT ILLNESS:  This is a 65 year old obese female admitted  on January 27, 2007 for same-day surgery, due to L4-5 spondylolisthesis  with L3-4, L4-5 and L5-S1 stenosis.  She underwent bilateral laminectomy  and diskectomy L3-4, L4-5 with facetectomy and pedicle screws L4-5 and  posterolateral arthrodesis.  Post-op, she has developed atelectasis and  fever, hypoxemia.  We are asked to assist.  The patient has a poor cough  mechanic and has history of bronchitis in the past.   PAST HISTORY:  Medical:  History of obesity, diabetes, hypertension,  hypercholesterolemia, reflux disease, anemia.  Preoperative medications include:  1. Ipratropium by nebulization 4 times daily.  2. GlycoLax p.r.n.  3. Methocarbamol 4 times daily.  4. Furosemide 20 mg daily.  5. Nexium 40 mg b.i.d.  6. Amitriptyline daily.  7. Aspirin 81 mg daily.  8. Benzonatate 200 mg p.r.n.   ALLERGIES:  NONE.   SOCIAL HISTORY:  Negative.   FAMILY HISTORY:  Heart disease.   OPERATIVE HISTORY:  Decompressive laminectomy in 2004, cholecystectomy  and tonsillectomy.   PHYSICAL EXAM:  VITAL SIGNS:  T-max 102, blood pressure 120/55, pulse  128, saturation 92% on 4 liters.  CHEST:  Showed distant breath sounds, poor airflow, few scattered  rhonchi.  CARDIAC:  Showed a regular rate and rhythm without S3, normal S1, S2.  ABDOMEN:  Soft, nontender.  EXTREMITIES:  Showed no edema, clubbing or venous disease.  SKIN:  Clear.  NEUROLOGIC:  Exam was intact.  HEENT:  Exam showed no jugular venous distension, no  lymphadenopathy.  OROPHARYNX:  Clear.  NECK:  Supple.   LABORATORY DATA:  Urinalysis was negative blood cultures done on June  26, no growth to date.  White count 12,600, hemoglobin 7.7, sodium 139,  potassium 4.6, CO2 26, BUN 15, creatinine 1.3.  CHEST X-RAY:  Show  progressive bilateral lower lobe atelectasis.   IMPRESSION:  Atelectasis with mucous plugging, post-op fever with acute  hypoxemia, VQ mismatch and shunt, tending towards pneumonia with  associated diabetes, obesity, chronic pain syndrome, hypertension, poor  cough mechanics.   RECOMMENDATIONS:  Intensive flutter valve, incentive spirometry.  Follow  up blood cultures, increase nebs, give IV Rocephin 2 grams daily.   FOLLOWUP:  Sputum culture.      Burnett Harry Joya Gaskins, MD, [facility]  Electronically Signed     PEW/MEDQ  D:  01/30/2007  T:  01/31/2007  Job:  [id number]   cc:   Leeroy Cha, M.D.

## 2010-12-18 NOTE — Assessment & Plan Note (Signed)
[facility] OFFICE NOTE   NAME:[name], Rita VENECIA                   MRN:          [id number]  DATE:01/08/2007                            DOB:          [birth year]    CARDIOLOGIST:  Dr. Dannielle Burn.   PRIMARY CARE PHYSICIAN:  Dr. Feliciana Rossetti.   HISTORY OF PRESENT ILLNESS:  [name] is a 65 year old female patient  with a history of nonobstructive coronary disease by catheterization in  2000 and nonischemic Myoview study in November of 2007, who presents to  the office today for followup for surgical clearance.  She saw [name], ACNP, Dec 17, 2006.  At that time, she was set up for an  echocardiogram.  This returned revealing normal LV size and contraction,  EF of 65%.  No segmental wall motion abnormalities and no significant  valvular disease.  She returns today for followup.  In talking to her,  she notes symptoms of shortness of breath with exertion.  These have  been chronic but increasing over time.  She has significant limitation  from her lumbar degenerative disk disease.  She is having left lower  extremity radicular symptoms and needs to have lumbar spine surgery.  She also has COPD as well as morbid obesity.  All of these factors are  most likely contributing to her shortness of breath.  She does note  chest pressure with exertion.  This has been a chronic symptom for her.  She notes that, if she goes out to the grocery store, she will get short  of breath and then develop chest pressure.  She really denies any  radiating symptoms, nausea, diaphoresis, syncope, or near-syncope.  This  symptom has been present for quite some time.  She had it back in  November when she had her stress test, and there has been no change.   CURRENT MEDICATIONS:  1. Ipratropium/albuterol nebulizers 4 times a day.  2. Hydrocodone/APAP 5/500 one tablet q.6h.  3. Methocarbamol 750 mg 4 times a day.  4. Diovan  Hydrochlorothiazide 160/25 mg daily.  5. Amitriptyline 50 mg daily.  6. Meloxicam 7.5 mg daily.  7. Vytorin 10/80 mg daily.  8. Nexium 40 mg twice daily.  9. Tandem Plus capsule multivitamin daily.  10.Furosemide 20 mg daily.  11.Benzonatate 200 mg 3 times a day p.r.n.  12.Actonel 35 mg weekly.  13.Albuterol inhaler 2 puffs 4 times daily.  14.Advair 250/50 one puff b.i.d.  15.GlycoLax p.r.n.  16.Proctofoam p.r.n.  17.Loratadine 10 mg p.r.n.  18.Aspirin 81 mg daily.  19.Multivitamin.  20.Calcium plus vitamin D.  21.Caltrate.  22.Oxygen 2L.  23.Mucinex 600 mg 2 tablets daily as needed.  24.B12 injection once a month.   ALLERGIES:  No known drug allergies.   PHYSICAL EXAM:  She is a well-nourished, well-developed female in no  acute distress.  Blood pressure 139/83, pulse 99, weight 237.7 pounds, oxygen saturation  96% on room air.  HEENT:  Normal.  NECK:  Without JVD at 90 degrees.  ENDOCRINE:  Without thyromegaly.  Carotids without bruits bilaterally.  CARDIAC:  Normal S1, S2.  Regular rate  and rhythm.  LUNGS:  Clear to auscultation bilaterally without wheeze, rales, or  rhonchi.  ABDOMEN:  Soft and nontender.  EXTREMITIES:  With trace 1+ edema bilaterally.  Calves soft and  nontender.  SKIN:  Warm and dry.  NEUROLOGIC:  She is alert and oriented x3.  Cranial nerves 2-12 are  grossly intact.   IMPRESSION:  1. Nonobstructive coronary artery disease by catheterization in 2000.      a.     Adenosine Cardiolite June 20, 2006 negative for       ischemia.  2. Chronic dyspnea with exertion.      a.     Multifactorial - most likely due to deconditioning, morbid       obesity, and chronic obstructive pulmonary disease.  3. Exertional chest discomfort - suspect non-cardiac.  4. Diabetes mellitus.  5. Chronic obstructive pulmonary disease.  6. Obstructive sleep apnea.  7. Hypertension.  8. Hyperlipidemia.  9. History of anemia.  10.Family history of coronary artery  disease.  11.Morbid obesity.  12.Lumbar degenerative disk disease.      a.     Needs surgery.  13.Good left ventricular function with an ejection fraction of 65% by      recent echocardiogram.   PLAN:  The patient presents to the office today for followup for  surgical clearance for her upcoming back surgery.  She has been told by  her pulmonologist that she is at high risk for complications in the  perioperative period.  According to ACC/AHA guidelines, she requires no  further cardiac workup prior to her non-cardiac surgery.  She has  exertional chest discomfort that I think is probably more related to  chest wall pain from increasing shortness of breath.  She reportedly had  nonobstructive disease by catheterization in 2000 and a nonischemic  Cardiolite study in November of 2007.  Her chest discomfort has not  changed since that time.  She had a recent 2D echocardiogram that showed  normal left ventricular function and no wall motion abnormalities.  From  a cardiovascular standpoint, she is probably at moderate risk for  perioperative complications.  She notes that she has been told if she  declines surgery for her back, that her disease will continue to  progress and cause more problems with her left leg.  I had a long talk  with her today and explained to her that no other tests that we do will  change her risk.  She will need to continue discussions with Dr. Joya Salm  and make a decision as to whether or not she wants to proceed with  surgery versus conservative management.  I discussed the patient's case  today with Dr. Dannielle Burn, who agreed.  I do note that she is not on a beta  blocker.  I think, given her significant pulmonary disease, I would not  recommend her starting on a beta blocker for prophylactic reasons.  Should the patient opt for surgery, our service will certainly be  available as necessary.      Richardson Dopp, PA-C Electronically Signed      Ernestine Mcmurray,  MD,FACC  Electronically Signed   SW/MedQ  DD: 01/08/2007  DT: 01/08/2007  Job #: [id number]   cc:   Duke Salvia, M.D.  Leeroy Cha, M.D.

## 2010-12-18 NOTE — Assessment & Plan Note (Signed)
[facility] OFFICE NOTE   NAME:Keathley, MIKAIYA STALLONE                   MRN:          [id number]  DATE:12/17/2006                            DOB:          May 05, 1946    Ms. Porath returns today for her 6 month followup.  She is a 65 year old  Caucasian female who is initially evaluated by Dr. Dannielle Burn back in  November for cardiac clearance prior to colonoscopy.  Ms. Bonfiglio has  multiple comorbidities including diabetes, morbid obesity, COPD,  hypertension, obstructive sleep apnea and a strong family history of  heart disease both in grandfather, grandmother, mother, brother, sister  and an uncle, all of whom have died from heart disease.  Ms. Llera  underwent a stress Myoview in November after being evaluated by Dr.  Dannielle Burn.  Stress Myoview nondiagnostic for ischemia.  Ejection fraction  was calculated at 61%.  There was a small periapical defect which was  nonreversible.  Ms. Chew states she had the colonoscopy without any  problems, was diagnosed with diverticulosis and internal hemorrhoids.  She does however, complain of back discomfort that is getting more  intense and affecting her activities of daily living.  Apparently she  saw Dr. Joya Salm and was told that she needed more back surgery to try and  remedy her ongoing discomfort.  Ms. Kovalev also complains of dyspnea  that she feels is increasing in intensity, she is not short of breath at  rest, denied any orthopnea or PND, but she states she becomes short of  breath with minimal exertion.  She also complains of intermittent  palpitations that has not changed from previous assessment in November.  She also complains of occasional pressure in chest that is usually  associated with increased emotional stress, it may last just seconds to  minutes.  She is occasionally light headed but does not correlate this  with the chest pressure or the palpitations.  Ms.  Izatt is not  particularly happy about having more back surgery done, but she states  she has been miserable for the last 2 days and cannot go on like this  and if back surgery is the thing that will improve her situation she is  agreeable to proceed with that.   PAST MEDICAL HISTORY:  Is quite lengthy.  1. She has apparently had a cardiac catheterization back in 2000 that      demonstrated non-obstructive disease.  Most recent stress Myoview      in October 2007 show no ischemia.  2. Morbid obesity.  3. Extreme deconditioning.  4. Diabetes.  5. COPD.  6. Obstructive sleep apnea followed by Dr. Asencion Noble.  7. Hypertension.  8. Dyslipidemia.  9. Anemia.  10.Chronic dyspnea.  11.Palpitations.  12.Degenerative joint disease.   ALLERGIES:  No known drug allergies.   CURRENT MEDICATIONS:  1. Albuterol neb q.i.d.  2. Hydrocodone 1 tablet every 6 hours.  3. Methocarbamol 750 mg 1 tablet q.i.d.  4. Diovan/hydrochlorothiazide 160/25 mg daily.  5. Amitriptyline 50 mg nightly.  6. Meloxicam 7.5 mg daily.  7. Vytorin 10/80 mg daily.  8. Nexium 40  mg b.i.d.  9. Tandem Plus capsule 1 daily.  10.Furosemide 20 mg daily.  11.Benzonatate 200 mg capsule, one t.i.d. p.r.n.  12.Actonel 35 mg daily.  13.Albuterol inhaler 2 puffs q.i.d.  14.Advair 250/50 one puff b.i.d.  15.GlycoLax 17 gm daily.  16.Proctofoam HC 1% 3 to 4 times daily p.r.n.  17.Loratadine 10 mg p.r.n.  18.Aspirin 81 mg daily.  19.Multivitamin daily.  20.Calcium with vitamin D daily.  21.Caltrate 600 daily.  22.Oxygen 2 L nightly.  23.Mucinex 600 mg 2 tablets daily as needed.  24.B12 injection once a month.   PHYSICAL EXAMINATION:  Weight 238 pounds.  Heart rate 102, blood  pressure 141/82.  EKG sinus rhythm at a rate of 95 without acute ST or T-wave changes.  Ms. Ordonez is a 65 year old Caucasian female, morbidly obese, currently  appears to be in some discomfort associated with her back.  She is  moving very  gingerly on the chair.  Unable to do exam on table secondary  to back discomfort.  At a 90 degree angle she has no signs of JVD.  She is normocephalic, atraumatic.  EENT:  Unremarkable.  LUNGS:  Are clear, poor inspiratory effort throughout.  Distant breath  sounds.  CARDIOVASCULAR:  S1 and S2.  Also distant heart sounds.  No murmurs,  rubs or gallops noted.  ABDOMEN:  Soft, nontender. Positive bowel sounds, obese.  EXTREMITIES:  Without clubbing, cyanosis or edema.  NEUROLOGICALLY:  Patient alert and oriented x3.  Movement of extremities  x4.   PROBLEM LIST:  1. Ongoing dyspnea, multifactorial.  Most likely secondary to extreme      deconditioning, morbid obesity, chronic obstructive pulmonary      disease.  2. Chest pain with no acute changes on EKG.  Recent stress Myoview      showing no ischemia.  3. Diabetes.  4. Sleep apnea.  5. Hypertension.  6. Dyslipidemia.  7. Chronic obstructive pulmonary disease requiring O2 p.r.n.  8. Ongoing back discomfort with tentative plans for orthopedic      surgery.  Patient is at moderate risk for intraoperative coronary      event; however in seeing a recent stress Myoview and history of non-      obstructive coronary artery disease by cath in the past, we will      plan on proceeding with echocardiogram to check for wall motion      abnormality.  If no significant findings, patient okay to proceed      with orthopedic surgery.  However, will plan on reevaluating      patient after echocardiogram obtained.  The patient's case      discussed with Dr. Dannielle Burn who is in agreement and I want to see      patient after her echocardiogram.      Rosanne Sack, ACNP       Ernestine Mcmurray, MD,FACC    MB/MedQ  DD: 12/17/2006  DT: 12/17/2006  Job #: [id number]   cc:   Leeroy Cha, M.D.  Duke Salvia, M.D.

## 2010-12-18 NOTE — Op Note (Signed)
[name], [name]            ACCOUNT NO.:  [id number]   MEDICAL RECORD NO.:  [id number]          PATIENT TYPE:  OIB   LOCATION:  [id number]                         FACILITY:  [facility]   PHYSICIAN:  Gwenyth Ober, M.D.    DATE OF BIRTH:  28-Aug-1945   DATE OF PROCEDURE:  10/27/2008  DATE OF DISCHARGE:                               OPERATIVE REPORT   PREOPERATIVE DIAGNOSIS:  Supraumbilical ventral hernia.   POSTOPERATIVE DIAGNOSIS:  A 5-cm supraumbilical ventral hernia.   PROCEDURE:  Primary repair of a supraumbilical ventral hernia without  mesh.   SURGEON:  Gwenyth Ober, MD   ASSISTANT:  Henreitta Cea, PA   ANESTHESIA:  General endotracheal.   ESTIMATED BLOOD LOSS:  Less than 20 mL.   COMPLICATIONS:  None.   CONDITION:  Stable.   FINDINGS:  The patient had a 5-cm supraumbilical ventral hernia defect  likely from a previous laparoscopic procedure.   INDICATIONS FOR OPERATION:  The patient is a 65 year old who comes in  with a supraumbilical ventral hernia and also diastasis recti, who now  comes in for repair.   OPERATION:  The patient was taken to the operating room and placed on  table in the supine position.  After an adequate general endotracheal  anesthetic was administered, she was prepped and draped in the usual  sterile manner exposing the midline of the abdomen.   A transverse incision was made at the level of her previous  supraumbilical scar from her laparoscopic procedure.  We dissected down  into the subcu, down to the fascial level well.  The supraumbilical  hernia defect was noted protruding off towards the right.  As we  dissected down to the fascial margins, we were able to get into the sac  and allowed the omentum that there was incarcerated in the sac to fall  back into the peritoneal cavity.   As we pulled up on the fascial edges, we created a plane  circumferentially for our suture repair.  We did a primary interrupted  #1 Novofil repair followed  by a running back-and-forth Novofil repair  after the primary interrupted sutures were placed.  Once this was done,  we irrigated with saline solution and closed.  We closed in 2 layers of  3-0  Vicryl subcu layer and then the skin was closed using running  subcuticular stitch of 4-0 Monocryl.  Marcaine 0.5% without epi was  injected to the wound.  Sterile dressing was applied including  Dermabond, Steri-Strips, and a Tegaderm.  All counts were correct.      Gwenyth Ober, M.D.  Electronically Signed     JOW/MEDQ  D:  10/27/2008  T:  10/28/2008  Job:  [id number]   cc:   Duke Salvia, M.D.

## 2010-12-21 NOTE — Consult Note (Signed)
[name], [name]            ACCOUNT NO.:  [id number]   MEDICAL RECORD NO.:  [id number]         PATIENT TYPE:  AMB   LOCATION:  DAY                           FACILITY:  [facility]   PHYSICIAN:  Caro Hight, M.D.      DATE OF BIRTH:  1946-05-01   DATE OF CONSULTATION:  09/03/2006  DATE OF DISCHARGE:                                 CONSULTATION   REASON FOR CONSULTATION:  Possible upper endoscopy and colonoscopy for  abdominal pain, uncontrolled, with reflux disease.   HISTORY OF PRESENT ILLNESS:  [name] is a 65 year old female who has  had symptoms of uncontrolled reflux for 1-2 years.  She has been  maintained on Nexium for 10-12 years.  She was recently seen by her  pulmonologist approximately 3 weeks ago for persistent cough.  He  increased her Nexium to twice a day.  Her symptoms are 30% better.  She  complains of a burning sensation in her chest.  She has been taking her  Nexium 30 minutes before meals.  She states her symptoms are worse  during the nighttime.  She has a burning sensation in her throat  approximately two times a week.  She also complains of having difficulty  pushing her stool out.  The stool is soft when it comes out.  She  complains of chronic abdominal pain located around her umbilicus.  She  was told at one time she had hernia.  She also complains that sometimes  she feels like food gets stuck in her chest.  She has difficulty with  both solids and liquids, sometimes she feels like water can get stuck  going down.  This sensation happens two to three times a week.  She  vomits one to two times a month.  Her weight fluctuates.  She denies  weight loss.  Her appetite fluctuates.  She says sometimes it is good,  sometimes it is bad.  She also sees rectal bleeding every 2-3 months.  She denies any black tarry stools.  She states she had a colonoscopy  approximately 6 years ago in the [facility].  It was done  for stomach trouble.  She reports  no polyps being found but had  diverticulosis.  She does not exactly remember the physician who  performed it but believes it may have been Dr. Lisbeth Ply.  She has never  had an upper endoscopy.   PAST MEDICAL HISTORY:  1. Diabetes.  2. GERD.  3. COPD.  4. Hyperlipidemia.  5. Hypertension.  6. Sleep apnea with a nasal mask.  7. Anemia.  8. Hernia.  9. Heart palpitations.  10.Tachycardia.  11.Arthritis.  12.Degenerative disk disease.   PAST SURGICAL HISTORY:  1. Partial hysterectomy.  2. Cholecystectomy performed for pain and vomiting which improved      after her surgery.  3. Tonsillectomy.  4. Uvulectomy for sleep apnea.  5. Back surgery.   ALLERGIES:  TO AN ANTIBIOTIC.   MEDICATIONS:  1. Vytorin.  2. Nexium 40 mg b.i.d.  3. Diovan.  4. Meloxicam.  5. Cartia XT.  6. Tandem Plus.  7. Amitriptyline.  8. Tessalon Perles.  9. Robaxin 750 mg b.i.d.  10.GlycoLax 17 grams daily for the last 4 months.  11.Spiriva.  12.Proctofoam-HC as needed for swelling and itching in her rectum.  13.Xopenex.  14.Caltrate.  15.Multivitamin.  16.Calcium.  17.Aspirin.  18.Aleve as needed.  19.Ibuprofen as needed.   FAMILY HISTORY:  She has a family history of diabetes, kidney disease,  stroke and myocardial infarction.  She denies any family history of  colon cancer or colon polyps.  She does have a family history of uterine  cancer in her mother and her sister.  She denies any family history of  breast cancer or ovarian cancer.   SOCIAL HISTORY:  She is married and has four children.  She is disabled  from her degenerative disk disease.  She denies any tobacco or alcohol  use.   REVIEW OF SYSTEMS:  As per the HPI otherwise all systems are negative.   PHYSICAL EXAMINATION:  VITAL SIGNS:  Weight 242.5 pounds.  Height 4 feet  11 inches.  Temperature 98, blood pressure 122/76, pulse 100.  GENERAL:  She is in no apparent distress, alert and oriented x4.  HEENT:  Atraumatic,  normocephalic.  Pupils equal and react to light.  Mouth:  No oral lesions.  Posterior pharynx without erythema or exudate.  NECK:  Her neck has full range of motion and no lymphadenopathy.  LUNGS:  Her lungs were clear to auscultation bilaterally.  CARDIOVASCULAR EXAM:  Regular rhythm.  No murmur.  Normal S1 and S2.  ABDOMEN:  Bowel sounds are present, soft, nondistended, mild tenderness  to palpation in all four quadrants without any rebound or guarding.  Unable to appreciate any hepatosplenomegaly.  Her abdomen is obese.  She  has an abdominal defect which can be appreciated with Valsalva.  It is  reducible.  EXTREMITIES:  Her extremities are without cyanosis, clubbing, or edema.  NEURO:  She has no focal neurologic deficits.   LABORATORIES:  Hemoglobin A1c 7.1.  White count 8.5, hemoglobin 8.4,  platelets 507, B12 116, ferritin 7, folate 10.6.  BUN 13, creatinine  1.2.   ASSESSMENT:  [name] is a 65 year old female with gastroesophageal  reflux disease which is uncontrolled on Nexium.  Her solid and liquid  dysphagia is likely secondary to her uncontrolled reflux disease. She  also has iron-deficiency anemia with her last colonoscopy being  approximately 6 years ago.  The differential diagnosis includes peptic  stricture.  Thank you for allowing me to see [name] in consultation.  My recommendations follow.   RECOMMENDATIONS:  1. I will schedule EGD and colonoscopy as soon as possible.  She needs      to be off aspirin for 7 days prior to her endoscopy.  2. She may continue the GlycoLax and the Nexium b.i.d.  3. We will obtain colonoscopy report from [facility].  4. If she does not have a stricture, I will place a Bravo capsule      study to see if her Nexium therapy is adequate.  If on Nexium      b.i.d. therapy is inadequate, then she may be a candidate for      Nissen fundoplication. 5. We will consider referral to general surgery for her abdominal wall       defect and possible surgical repair for her persistent abdominal      pain.  6. She has a follow-up appointment to see me in 6 weeks.      [employer],  M.D.  Electronically Signed     SM/MEDQ  D:  09/03/2006  T:  09/03/2006  Job:  [id number]   cc:   Duke Salvia, M.D.  Fax: [phone]

## 2010-12-21 NOTE — Op Note (Signed)
NAMESAMON, PTACEK            ACCOUNT NO.:  [id number]   MEDICAL RECORD NO.:  [id number]          PATIENT TYPE:  AMB   LOCATION:  DAY                           FACILITY:  [facility]   PHYSICIAN:  Caro Hight, M.D.      DATE OF BIRTH:  [birth year]   DATE OF PROCEDURE:  11/19/2006  DATE OF DISCHARGE:  11/19/2006                               OPERATIVE REPORT   PROCEDURE:  Given capsule endoscopy.   INDICATION FOR EXAM:  Ms. Bear is a 65 year old female who presents  with iron deficiency anemia of unknown etiology.  She has had an upper  endoscopy which revealed a large hiatal hernia with mild erythema in the  antrum but no erosions or ulcerations.  She had a normal duodenal bulb  and second portion of the duodenum.  She also had a colonoscopy on the  same day which showed only pancolonic diverticulosis and moderate  internal hemorrhoids.  She then returned for a repeat upper endoscopy in  April 2008 and, again, her large hiatal hernia showed no evidence of  ulcers.  She has had biopsies of her duodenal mucosa which showed no  evidence of celiac sprue.  Her ferritin was measured at 7.  Her Given  capsule was placed via upper endoscopy due to the size of her large  hiatal hernia.   PROCEDURE INFORMATION AND FINDINGS:  No AVMs seen in the small  intestines or active bleeding.  No ulcers or masses seen.   SUMMARY AND RECOMMENDATIONS:  No source for iron deficiency anemia  identified.  Consider hematology oncology referral.  Consider trial of  oral iron replacement.  She may have the inability to convert p.o. iron.  Would consider IV iron replacement.  Her inability to convert p.o. 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      Caro Hight, M.D.  Electronically Signed     SM/MEDQ  D:  11/27/2006  T:  11/27/2006  Job:  [id number]   cc:   Duke Salvia, M.D.  Fax: [phone]

## 2010-12-21 NOTE — Op Note (Signed)
[name], [name]            ACCOUNT NO.:  [id number]   MEDICAL RECORD NO.:  [id number]          PATIENT TYPE:  AMB   LOCATION:  DAY                           FACILITY:  [facility]   PHYSICIAN:  Caro Hight, M.D.      DATE OF BIRTH:  1945/08/09   DATE OF PROCEDURE:  10/01/2006  DATE OF DISCHARGE:                               OPERATIVE REPORT   REFERRING PHYSICIAN:  Duke Salvia, M.D.   PROCEDURE:  1. Colonoscopy.  2. Esophagogastroduodenoscopy with cold forceps biopsy and Bravo      capsule placement.   INDICATION FOR EXAM:  [name] is a 65 year old female with bad  indigestion every day while on Nexium twice a day.  She also complains  of food getting stuck in her throat one to two times a week.  Food goes  down eventually.  She denies any vomiting, weight loss.  Her last  colonoscopy is 2002 and showed diverticulitis and internal hemorrhoids.  She has rectal bleeding.  Last episode of rectal bleeding was last week.  She had measured hemoglobin of 8.4 and ferritin of 7.  She has iron  deficiency anemia.  Her colonoscopy and upper endoscopy is being done to  investigate rectal bleeding, iron deficiency anemia, uncontrolled reflux  disease, and dysphagia.   FINDINGS:  1. Pan colonic diverticulosis and moderate internal hemorrhoids.      Otherwise no polyps, masses, inflammatory changes or AVMs.  2. Large hiatal hernia.  Distal esophagus without evidence of      erythema, ulceration or Barrett's esophagus.  Mild erythema in the      antrum without erosions or ulcerations.  Normal duodenal bulb and      second portion of the duodenum.  Biopsies obtained to evaluate for      celiac sprue which can be the cause for iron deficiency anemia.  No      source for iron deficiency anemia found on colonoscopy.  3. Because [name] complains of persistent heartburn and      indigestion on maximum proton pump therapy, a Bravo capsule study      was placed.  The first Bravo capsule  did not deploy and the capsule      apparatus was withdrawn.  The capsule was found at the upper      esophageal sphincter and subsequently became lodged at the UES.      [name] did aspirate the capsule but subsequently coughed the      capsule up.  The second capsule was successfully deployed under      direct visualization at 28 cm from the teeth.   RECOMMENDATIONS:  1. A 96-hour Bravo capsule study will begin while [name] is on      Nexium b.i.d.  2. High-fiber diet.  [name] is given a handout on high-fiber diet,      diverticulosis and hemorrhoids.  3. Screening colonoscopy in 10 years.  4. No aspirin or anti-inflammatory drugs for 3 days.  5. I will call [name] with the results of her biopsy.  If her  biopsy showed no evidence of celiac sprue, then a capsule endoscopy      will be ordered.  6. She has a follow-up appointment to see me in 2 weeks.   MEDICATIONS:  1. Colonoscopy - Demerol 100 mg IV, Versed 7 mg IV.  2. Esophagogastroduodenoscopy - Versed 3 mg IV.   PROCEDURE TECHNIQUE:  After physical exam was performed informed consent  was obtained from the patient after explaining benefits, risks and  alternatives to the procedure.  The patient was connected to the monitor  and placed in left lateral position.  Continuous oxygen was provided by  nasal and IV medicine administered through an indwelling cannula.  After  administration of sedation and rectal exam, the patient's rectum was  intubated and the scope was advanced under direct visualization to the  cecum.  The scope was subsequently removed slowly by carefully examining  the color, texture, anatomy and integrity mucosa on the way out.   After the colonoscopy, the patient's esophagus was intubated with a  diagnostic gastroscope.  The scope was advanced under direct  visualization to the second portion of duodenum.  The scope subsequently  removed slowly by carefully examining the color, texture,  anatomy and  integrity mucosa on the way out.  The first Bravo capsule did not deploy  and the capsule apparatus was withdrawn.  The capsule was found at the  upper esophageal sphincter and subsequently became lodged at the UES.  [name] did aspirate the capsule but subsequently coughed the capsule  up.  The second capsule was successfully deployed under direct  visualization at 28 cm from the teeth. The patient was recovered in  endoscopy suite and discharged home in satisfactory condition.      Caro Hight, M.D.  Electronically Signed     SM/MEDQ  D:  10/01/2006  T:  10/01/2006  Job:  [id number]   cc:   Duke Salvia, M.D.  Fax: [phone]

## 2010-12-21 NOTE — Op Note (Signed)
NAMECARLYNN, KNOPE            ACCOUNT NO.:  [id number]   MEDICAL RECORD NO.:  [id number]          PATIENT TYPE:  AMB   LOCATION:  DAY                           FACILITY:  [facility]   PHYSICIAN:  Caro Hight, M.D.      DATE OF BIRTH:  [birth year]   DATE OF PROCEDURE:  11/19/2006  DATE OF DISCHARGE:                               OPERATIVE REPORT   PROCEDURE:  Esophagogastroduodenoscopy with Savary dilation and Givens  capsule placement.   INDICATION FOR EXAM:  Ms. Rindler is a 65 year old female with  gastroesophageal reflux disease.  She still continues to complain of  heartburn that is bad 2-4 times a week, in spite of being on Nexium  twice a day and a Bravo study, which shows no physiologic evidence of  breakthrough acid exposure in her esophagus.  She complains of problems  swallowing solids a few times a week.  Sometimes more, sometimes not at  all.  She has iron-deficiency anemia, no etiology revealed on upper  endoscopy or colonoscopy.  She did have moderate hemorrhoids on  colonoscopy.   FINDINGS:  1. Large hiatal hernia without evidence of Cameron ulcers.  2. Distal esophageal stricture, which allowed the gastroscope to pass      without resistance.  A 16 mm Savary later passed with mild      resistance.  Otherwise, distal esophagus without evidence of      Barrett's, ulcerations or erosions.  3. Normal stomach.  Normal duodenal bulb and second portion of the      duodenum.  The Givens capsule was released into the second portion      of the duodenum.   RECOMMENDATIONS:  1. Clear liquids for 8 hours, then resume previous diet.  2. No aspirin, NSAIDs or anticoagulation for 7 days.  3. We will call Ms. Stonier with the results of her Givens capsule      report.  4. She has a follow up appointment to see me in May 2008.  5. Demerol 100 mg IV.  6. Versed 6 mg IV.   PROCEDURE TECHNIQUE:  Physical exam was performed.  Informed consent was  obtained from the patient after  explaining the benefits, risks and  alternatives to the procedure.  The patient was connected to the monitor  and placed in the left lateral position.  Continuous oxygen was provided  by nasal cannula and IV meds administered by an indwelling cannula.  After administration of sedation, the patient's esophagus was intubated  and the scope was advanced under direct visualization to the second  portion of the duodenum.  The scope was withdrawn into the antrum after  the completion of the full retroflexed exam.  The Savary guidewire was  introduced into the antrum.  The scope was withdrawn by subsequently  visualizing the anatomy integrity and texture of the mucosa on the way  out.  The 16 mm Savary dilator was introduced over the guidewire with  mild resistance.  The Givens introducing device was placed through the  scope.  The capsule was placed into position.  The scope was then  advanced into the  second portion of the duodenum.  The capsule was released.  The  gastroscope was withdrawn.  The patient was recovered in the endoscopy  suite and discharged home in satisfactory condition.  The patient will  return later today to have the information downloaded.      Caro Hight, M.D.  Electronically Signed     SM/MEDQ  D:  11/19/2006  T:  11/19/2006  Job:  [id number]   cc:   Duke Salvia, M.D.  Fax: [phone]

## 2010-12-21 NOTE — Assessment & Plan Note (Signed)
[place]                             PULMONARY OFFICE NOTE   NAME:DENNISMarren, Lull                     MRN:          [id number]  DATE:08/27/2006                            DOB:          12-Oct-1945    Ms. Crotzer returns in followup. Is a 65 year old white female who we  have not seen since March of 2006. She has a history of asthmatic  bronchitis, chronic obstructive lung disease. She has also had diagnosis  of sleep apnea, now on CPAP 8 cm of water pressure. She states that she  is having increased dyspnea recently with increased cough with a thick  white-green mucus for the last 2 months. Recent CT scan of the chest had  been obtained showing no change in pulmonary nodules since 2005.   Her medication profile has changed. She is on the nabumetone 750 mg  b.i.d., Nexium 40 mg b.i.d., Actonel 35 mg weekly, iron daily, aspirin  81 mg daily, amitriptyline 50 mg at h.s., multivitamin daily, Spiriva  daily, albuterol 2 sprays q.i.d., Vytorin 10/40 daily, Diovan 160/25,  hydrochlorothiazide daily, meloxicam 7.5 mg daily, Cartia 120 mg daily,  benzonatate 1 t.i.d., Claritin 20 mg daily.   PHYSICAL EXAMINATION:  Temperature 97.0, blood pressure 118/70, pulse  105, saturation 94% on room air.  CHEST: Showed diminished breath sounds with prolonged expiratory phase.  No wheeze or rhonchi were noted.  CARDIAC: Showed a regular rate and rhythm without S3. Normal S1, S2.  ABDOMEN: Soft, nontender.  EXTREMITIES: Showed no edema or clubbing or venous disease.  SKIN: Was clear.  NEUROLOGIC: Was intact.  HEENT: Showed no jugular venous distention. No lymphadenopathy.  Oropharynx was clear.  NECK: Supple.   IMPRESSION:  Is that of asthmatic bronchitis, stable at this time with  right lower lobe lung nodule, also stable since 2005.   PLAN:  Is to maintain inhaled medicines as currently dosed with no  change in plan of care. The pulmonary nodule is stable as  well as it  does not require further studies. Will see the patient back in return  followup in 6 months.     Burnett Harry Joya Gaskins, MD, [facility]  Electronically Signed    PEW/MedQ  DD: 08/28/2006  DT: 08/28/2006  Job #: [id number]   cc:   Duke Salvia, M.D.

## 2010-12-21 NOTE — Assessment & Plan Note (Signed)
[place] OFFICE NOTE   NAME:[name], [name]                     MRN:          [id number]  DATE:06/17/2006                            DOB:          1945/10/29    REFERRING PHYSICIANS:  Duke Salvia, M.D.   REASON FOR CONSULTATION:  Cardiac clearance prior to colonoscopy.   HISTORY OF PRESENT ILLNESS:  The patient is a 65 year old female with a  history of multiple co-morbidities including multiple cardiovascular risk  factors. The patient is status post prior catheterization at [facility] in  2000.  Reportedly, this demonstrated nonobstructive disease.  She also had a  stress test done prior to her back surgery at [facility] in 2004.  Reportedly,  this was also within normal limits and the patient proceeded with successful  surgery.   The patient was seen recently by Dr. Lavone Neri for an evaluation of anemia and  possible blood loss.  The patient is planned to undergo colonoscopy,  however, due to concerns of her multiple cardiac risk factors, as well as  history of palpitations and shortness of breath on minimal exertion,  cardiology referral was made.   The patient states she is very deconditioned and walking across the street  gives her extreme shortness of breath.  She also feels occasional  palpitations.  She does report a chronic cough.  She states that she also  occasionally has a funny feeling in the chest during exertion.  She is only  able to walk 50 feet.  She reports occasional atypical chest pain both at  rest and on exertion.   MEDICATIONS:  The patient has a long list of medications which include  loratadine, meloxicam, amitriptyline, Nexium, albuterol, Diovan,  methocarbamol, benzonatate, Vytorin, Actonel, Spiriva, Glycalox, nabumetone,  aspirin, multivitamin, calcium and Caltrate.   SOCIAL HISTORY:  The patient lives in [place].  She is on disability. She does  not smoke or drink.   FAMILY HISTORY:  Notable for a strong history of heart disease both in  grandfather, grandmother, mother, brother, sister and an uncle, all of whom  have died from heart disease.   ALLERGIES:  No known drug allergies.   REVIEW OF SYSTEMS:  As per history of present illness.  The patient also  reports orthopnea, PND, positive for palpitations, positive for  constipation, abdominal pain.  No melena, hematochezia, no dysuria or  frequency.   PHYSICAL EXAMINATION:  VITAL SIGNS:  Blood pressure 148/84, heart rate is 91  beats per minute.  GENERAL APPEARANCE:  Obese white female, pale appearing.  HEENT:  Pupils equal, round, reactive to light, conjunctivae clear.  NECK:  Supple.  Normal carotid upstroke, no carotid bruits.  LUNGS:  Diminished breath sounds bilaterally but no wheezing.  HEART:  Regular rate and rhythm with normal S1, S2.  No murmurs, rubs or  gallops.  ABDOMEN:  Soft, nontender, no rebound or guarding.  Good bowel sounds.  EXTREMITIES:  Exam reveals no cyanosis, clubbing or edema.  NEUROLOGICAL:  Patient is alert and oriented and grossly nonfocal.   CLINICAL DATA:  Electrocardiogram demonstrates normal sinus  rhythm with no  specific T wave changes.   PROBLEM LIST:  1. Multiple cardiac risk factors.      a.     Rule out underlying coronary artery disease.      b.     Status post negative cardiac catheterization, negative stress       test several years ago.  2. Sleep apnea.  3. Diabetes.  4. Hypertension.  5. Dyslipidemia.  6. Strong family history of coronary artery disease.  7. Chronic obstructive pulmonary disease with chronic dyspnea.  8. Anemia, colonoscopy is scheduled.   PLAN:  1. From a cardiovascular perspective, I do not think there is any specific      contraindications for the patient to proceed with colonoscopy.  2. The patient, indeed, has multiple risk factors and needs further      ischemia work up.  An adenosine Cardiolite study has been  scheduled.  3. If this study is negative, the patient could be considered for an      evaluation with cardiac monitor given her frequent palpitations.  4. The patient is quite deconditioned and has chronic back pain.  I      suspect that her shortness of breath is largely due to exercise      intolerance. Further evaluation for underlying coronary artery disease      as indicated.     Rita Mcmurray, MD,FACC  Electronically Signed    GED/MedQ  DD: 06/17/2006  DT: 06/17/2006  Job #: [id number]

## 2010-12-21 NOTE — Op Note (Signed)
[name], [name]            ACCOUNT NO.:  [id number]   MEDICAL RECORD NO.:  [id number]          PATIENT TYPE:  AMB   LOCATION:  DAY                           FACILITY:  [facility]   PHYSICIAN:  Caro Hight, M.D.      DATE OF BIRTH:  1946/05/28   DATE OF PROCEDURE:  10/01/2006  DATE OF DISCHARGE:  10/01/2006                               OPERATIVE REPORT   REFERRING PHYSICIAN:  Duke Salvia, M.D.   PROCEDURE:  48-hour Bravo pH study.   INDICATION FOR EXAM:  [name] is a 65 year old female who complains  of bad indigestion every day while on Nexium twice a day.  She had an  EGD on February 27 that showed a distal esophagus without evidence of  erythema or ulceration or Barrett's esophagus.  She also had a large  hiatal hernia.  The Bravo capsule was placed 28 cm from the teeth.  The  initial [place] study was planned for 96 hours but the data for day one  and day two was lost.  The data that is available is from day three and  day four.  She was continue on her Nexium 40 mg twice daily.   INTERPRETATION OF THE STUDY:  On day 3 of the analysis.  She had three  episodes of reflux.  Two episodes occurred while she was upright and one  episode occurred while she was supine.  No episodes of reflux lasted  longer than five minutes.  She had no episodes of reflux that lasted  longer than one minute.  Her total Demeester scored was 0.7.  Demeester  normals are less than 14.72.   On day four of the study.  She had 5 total numbers of reflux.  Five  episodes occurred while she was supine.  She had one episode of reflux  that lasted one minute.  She had a total of four minutes of time that  the pH in her esophagus was less than 4.  On day 4 her Demeester score  was 2.1.  Again the Demeester normals are less than 14.72.  Her symptom  association probability analysis showed 98.3% with regurgitation and  10.7 with heartburn and 7.5 with chest pain.   [name] had relatively few episodes  of gastroesophageal reflux  disease during the study as sensed by the probe.  She had a few episodes  of heartburn and chest pain which actually correlated with episodes of  reflux, but her symptoms of regurgitation had pretty good correlation  with her episodes of reflux.   Overall, the study is within normal limits for physiologic reflux and  indicate that Nexium 40 mg twice daily provides adequate acid  suppression for her gastroesophageal reflux disease.  I recommend she  continue on her Nexium 40 mg twice daily indefinitely.  She does not  need a Nissen fundoplication based on the results of this Bravo pH  monitoring study.      Caro Hight, M.D.  Electronically Signed     SM/MEDQ  D:  10/22/2006  T:  10/23/2006  Job:  [id number]   cc:  Duke Salvia, M.D.  Fax: [phone]

## 2011-05-13 ENCOUNTER — Encounter: Payer: Self-pay | Admitting: Pulmonary Disease

## 2011-05-14 ENCOUNTER — Ambulatory Visit (INDEPENDENT_AMBULATORY_CARE_PROVIDER_SITE_OTHER): Payer: Managed Care, Other (non HMO) | Admitting: Critical Care Medicine

## 2011-05-14 ENCOUNTER — Ambulatory Visit (INDEPENDENT_AMBULATORY_CARE_PROVIDER_SITE_OTHER)
Admission: RE | Admit: 2011-05-14 | Discharge: 2011-05-14 | Disposition: A | Discharge status: Home / Self Care | Payer: Managed Care, Other (non HMO) | Source: Ambulatory Visit | Attending: Critical Care Medicine | Admitting: Critical Care Medicine

## 2011-05-14 ENCOUNTER — Encounter: Payer: Self-pay | Admitting: *Deleted

## 2011-05-14 VITALS — BP 158/72 | HR 94 | Temp 98.2°F | Ht 59.0 in | Wt 237.6 lb

## 2011-05-14 DIAGNOSIS — D509 Iron deficiency anemia, unspecified: Secondary | ICD-10-CM

## 2011-05-14 DIAGNOSIS — G4730 Sleep apnea, unspecified: Secondary | ICD-10-CM

## 2011-05-14 DIAGNOSIS — E1140 Type 2 diabetes mellitus with diabetic neuropathy, unspecified: Secondary | ICD-10-CM | POA: Insufficient documentation

## 2011-05-14 DIAGNOSIS — E538 Deficiency of other specified B group vitamins: Secondary | ICD-10-CM

## 2011-05-14 DIAGNOSIS — J209 Acute bronchitis, unspecified: Secondary | ICD-10-CM

## 2011-05-14 DIAGNOSIS — J449 Chronic obstructive pulmonary disease, unspecified: Secondary | ICD-10-CM

## 2011-05-14 DIAGNOSIS — K449 Diaphragmatic hernia without obstruction or gangrene: Secondary | ICD-10-CM | POA: Insufficient documentation

## 2011-05-14 DIAGNOSIS — G629 Polyneuropathy, unspecified: Secondary | ICD-10-CM

## 2011-05-14 DIAGNOSIS — E119 Type 2 diabetes mellitus without complications: Secondary | ICD-10-CM

## 2011-05-14 DIAGNOSIS — IMO0002 Reserved for concepts with insufficient information to code with codable children: Secondary | ICD-10-CM

## 2011-05-14 DIAGNOSIS — E669 Obesity, unspecified: Secondary | ICD-10-CM

## 2011-05-14 DIAGNOSIS — I509 Heart failure, unspecified: Secondary | ICD-10-CM | POA: Insufficient documentation

## 2011-05-14 DIAGNOSIS — E785 Hyperlipidemia, unspecified: Secondary | ICD-10-CM

## 2011-05-14 DIAGNOSIS — I10 Essential (primary) hypertension: Secondary | ICD-10-CM

## 2011-05-14 DIAGNOSIS — K2100 Gastro-esophageal reflux disease with esophagitis, without bleeding: Secondary | ICD-10-CM | POA: Insufficient documentation

## 2011-05-14 DIAGNOSIS — K219 Gastro-esophageal reflux disease without esophagitis: Secondary | ICD-10-CM

## 2011-05-14 MED ORDER — AZITHROMYCIN 250 MG PO TABS: ORAL_TABLET | ORAL | Status: DC

## 2011-05-14 MED ORDER — PREDNISONE 10 MG PO TABS: ORAL_TABLET | ORAL | Status: DC

## 2011-05-14 MED ORDER — ESOMEPRAZOLE MAGNESIUM 40 MG PO CPDR: 40.0000 mg | DELAYED_RELEASE_CAPSULE | Freq: Two times a day (BID) | ORAL | Status: DC

## 2011-05-14 MED ORDER — FORMOTEROL FUMARATE 20 MCG/2ML IN NEBU: 20.0000 ug | INHALATION_SOLUTION | Freq: Two times a day (BID) | RESPIRATORY_TRACT | Status: DC

## 2011-05-14 MED ORDER — BUDESONIDE 0.25 MG/2ML IN SUSP: 0.2500 mg | Freq: Every day | RESPIRATORY_TRACT | Status: DC

## 2011-05-14 NOTE — Progress Notes (Signed)
Quick Note:  Notify the patient that the Xray is stable and no pneumonia or other acute process No change in medications are recommended. Continue current meds as prescribed at last office visit ______

## 2011-05-14 NOTE — Progress Notes (Signed)
Subjective:    Patient ID: [name], female    DOB: 1946-07-31, 65 y.o.   MRN: [id number] 65 y.o. WF Sleep apnea and Copd last seen 2008 HPI Comments: Dx OSA on Cpap.    Shortness of Breath This is a chronic problem. The current episode started more than 1 year ago (worse over past one month). The problem occurs constantly (constant and wiht exertion). The problem has been rapidly worsening. Associated symptoms include chest pain, leg pain, leg swelling, PND, rhinorrhea, sputum production and wheezing. Pertinent negatives include no abdominal pain, ear pain, fever, headaches, hemoptysis, neck pain, orthopnea, rash, sore throat or vomiting. The symptoms are aggravated by any activity, smoke, fumes, lying flat, eating, odors and pollens. Associated symptoms comments: Notes chest tightness. Risk factors include smoking. She has tried beta agonist inhalers and steroid inhalers for the symptoms. The treatment provided moderate relief. Her past medical history is significant for bronchiolitis, chronic lung disease, COPD, a heart failure and pneumonia. There is no history of allergies, aspirin allergies, asthma, CAD, DVT or PE.  Cough This is a chronic problem. The current episode started more than 1 year ago. The problem has been rapidly worsening. The cough is productive of sputum. Associated symptoms include chest pain, heartburn, myalgias, nasal congestion, postnasal drip, rhinorrhea, shortness of breath and wheezing. Pertinent negatives include no chills, ear pain, fever, headaches, hemoptysis, rash or sore throat. The symptoms are aggravated by fumes, exercise, dust, pollens, lying down and stress. She has tried steroid inhaler and a beta-agonist inhaler for the symptoms. The treatment provided mild relief. Her past medical history is significant for bronchitis, COPD, emphysema and pneumonia. There is no history of asthma, bronchiectasis or environmental allergies.    Past Medical History    Diagnosis Date  . COPD (chronic obstructive pulmonary disease)   . Obesity   . Degenerative disc disease   . History of arthritis   . Vitamin B12 deficiency   . Iron deficiency anemia   . Sleep apnea   . Hypertension   . Mild hyperlipidemia   . DM (diabetes mellitus)   . History of hiatal hernia   . History of diverticulitis of colon   . Dysphagia, unspecified   . Dyspepsia   . Acid reflux   . Gout   . Neuropathy   . Congestive heart failure      Family History  Problem Relation Age of Onset  . Colon cancer Brother   . Ulcers Sister   . Ulcers Mother   . Diabetes Mother   . Heart attack Mother   . Stroke Mother   . Heart attack Brother   . Stroke Sister   . Diabetes Sister   . Heart attack Sister   . Kidney failure Sister   . Asthma Mother   . Asthma Sister   . Diabetes Brother   . Stroke Maternal Grandmother      History   Social History  . Marital Status: Married    Spouse Name: N/A    Number of Children: 4  . Years of Education: N/A   Occupational History  . Not on file.   Social History Main Topics  . Smoking status: Former Smoker -- 1.0 packs/day for 1 years    Types: Cigarettes    Quit date: 08/05/1961  . Smokeless tobacco: Never Used  . Alcohol Use: No  . Drug Use: No  . Sexually Active: Not on file   Other Topics Concern  . Not on file  Social History Narrative  . No narrative on file     No Known Allergies   No outpatient prescriptions prior to visit.       Review of Systems  Constitutional: Positive for activity change and fatigue. Negative for fever, chills, diaphoresis, appetite change and unexpected weight change.  HENT: Positive for congestion, facial swelling, rhinorrhea, sneezing, mouth sores, dental problem, voice change, postnasal drip, sinus pressure, tinnitus and ear discharge. Negative for hearing loss, ear pain, nosebleeds, sore throat, trouble swallowing, neck pain and neck stiffness.   Eyes: Positive for itching.  Negative for photophobia, discharge and visual disturbance.  Respiratory: Positive for cough, sputum production, chest tightness, shortness of breath and wheezing. Negative for apnea, hemoptysis, choking and stridor.   Cardiovascular: Positive for chest pain, palpitations, leg swelling and PND. Negative for orthopnea.  Gastrointestinal: Positive for heartburn, nausea and abdominal distention. Negative for vomiting, abdominal pain, constipation and blood in stool.  Genitourinary: Positive for urgency and frequency. Negative for dysuria, hematuria, flank pain, decreased urine volume and difficulty urinating.  Musculoskeletal: Positive for myalgias, back pain, joint swelling, arthralgias and gait problem.  Skin: Negative for color change, pallor and rash.  Neurological: Positive for weakness and light-headedness. Negative for dizziness, tremors, seizures, syncope, speech difficulty, numbness and headaches.  Hematological: Negative for environmental allergies and adenopathy. Does not bruise/bleed easily.  Psychiatric/Behavioral: Positive for sleep disturbance. Negative for confusion and agitation. The patient is nervous/anxious.        Objective:   Physical Exam Filed Vitals:   05/14/11 1017  BP: 158/72  Pulse: 94  Temp: 98.2 F (36.8 C)  TempSrc: Oral  Height: 4\' 11"  (1.499 m)  Weight: 237 lb 9.6 oz (107.775 kg)  SpO2: 99%    Gen: Pleasant, obese , in no distress,  normal affect  ENT: No lesions,  mouth clear,  oropharynx clear, no postnasal drip  Neck: No JVD, no TMG, no carotid bruits  Lungs: No use of accessory muscles, no dullness to percussion, distant BS, poor airflow  Cardiovascular: RRR, heart sounds normal, no murmur or gallops, no peripheral edema  Abdomen: soft and NT, no HSM,  BS normal  Musculoskeletal: No deformities, no cyanosis or clubbing  Neuro: alert, non focal  Skin: Warm, no lesions or rashes   CXR : LLL partial ATX,  Copd changes     Assessment &  Plan:   COPD Chronic obstructive lung disease with asthmatic bronchitic component exacerbated by obesity and recurrent tracheobronchitis. The patient is an ex-smoker. Also reflux disease is playing a role.  note chest x-ray only shows left lower lobe partial atelectasis and COPD changes I am not at all convinced the patient is able to take a deep breath or with the Advair inhaler Plan Stop advair Start perforomist and budesonide in nebulizer twice daily Prednisone 10mg  Take 4 for two days three for two days two for two days one for two days Zithromax 250mg  Take two once then one daily until gone Increase Nexium to one twice daily Follow reflux diet Obtain full set of pulmonary function studies    Updated Medication List Outpatient Encounter Prescriptions as of 05/14/2011  Medication Sig Dispense Refill  . amitriptyline (ELAVIL) 100 MG tablet Take 100 mg by mouth at bedtime.        Marland Kitchen aspirin 81 MG tablet Take 81 mg by mouth daily.        Marland Kitchen esomeprazole (NEXIUM) 40 MG capsule Take 1 capsule (40 mg total) by mouth 2 (two) times daily  before a meal.  60 capsule  6  . ezetimibe-simvastatin (VYTORIN) 10-80 MG per tablet Take 1 tablet by mouth at bedtime.        . furosemide (LASIX) 40 MG tablet Take 40 mg by mouth 2 (two) times daily.        Marland Kitchen glimepiride (AMARYL) 2 MG tablet Take 2 mg by mouth daily.        Marland Kitchen ipratropium-albuterol (DUONEB) 0.5-2.5 (3) MG/3ML SOLN Take 3 mLs by nebulization as needed.        . loratadine (CLARITIN) 10 MG tablet Take 10 mg by mouth daily.        . nabumetone (RELAFEN) 750 MG tablet Take 750 mg by mouth 2 (two) times daily.        . valsartan (DIOVAN) 160 MG tablet Take 160 mg by mouth daily.        Marland Kitchen DISCONTD: esomeprazole (NEXIUM) 40 MG capsule Take 40 mg by mouth daily before breakfast.        . DISCONTD: Fluticasone-Salmeterol (ADVAIR) 250-50 MCG/DOSE AEPB Inhale 1 puff into the lungs every 12 (twelve) hours.        Marland Kitchen azithromycin (ZITHROMAX) 250 MG tablet  Take two once then one daily until gone  6 each  0  . budesonide (PULMICORT) 0.25 MG/2ML nebulizer solution Take 2 mLs (0.25 mg total) by nebulization daily.  120 mL  6  . formoterol (PERFOROMIST) 20 MCG/2ML nebulizer solution Take 2 mLs (20 mcg total) by nebulization 2 (two) times daily.  120 mL  6  . predniSONE (DELTASONE) 10 MG tablet Take 4 for two days three for two days two for two days one for two days  20 tablet  0

## 2011-05-14 NOTE — Patient Instructions (Signed)
Stop advair Start perforomist and budesonide in nebulizer twice daily Prednisone 10mg  Take 4 for two days three for two days two for two days one for two days Zithromax 250mg  Take two once then one daily until gone Increase Nexium to one twice daily Follow reflux diet Chest xray today Return for full pulmonary function test Return 6 weeks for recheck

## 2011-05-14 NOTE — Assessment & Plan Note (Addendum)
Chronic obstructive lung disease with asthmatic bronchitic component exacerbated by obesity and recurrent tracheobronchitis. The patient is an ex-smoker. Also reflux disease is playing a role.  note chest x-ray only shows left lower lobe partial atelectasis and COPD changes I am not at all convinced the patient is able to take a deep breath or with the Advair inhaler Plan Stop advair Start perforomist and budesonide in nebulizer twice daily Prednisone 10mg  Take 4 for two days three for two days two for two days one for two days Zithromax 250mg  Take two once then one daily until gone Increase Nexium to one twice daily Follow reflux diet Obtain full set of pulmonary function studies

## 2011-05-15 NOTE — Progress Notes (Signed)
Quick Note:  Called, spoke with pt. I informed her the xray is stable, no pna or other acute process per Dr. Joya Gaskins. Advised he recs no change in meds. She is to continue with current meds as prescribed at last OV. She verbalized understanding of these results and recs and voiced no further questions/concerns at this time. ______

## 2011-05-22 LAB — URINE CULTURE
Colony Count: NO GROWTH
Culture: NO GROWTH

## 2011-05-22 LAB — DIFFERENTIAL
Basophils Absolute: 0
Basophils Absolute: 0.1
Basophils Relative: 0
Eosinophils Relative: 2
Eosinophils Relative: 6 — ABNORMAL HIGH
Lymphocytes Relative: 11 — ABNORMAL LOW
Lymphs Abs: 1.4
Monocytes Absolute: 0.9 — ABNORMAL HIGH
Monocytes Absolute: 0.9 — ABNORMAL HIGH
Monocytes Relative: 8

## 2011-05-22 LAB — BASIC METABOLIC PANEL
BUN: 15
BUN: 40 — ABNORMAL HIGH
CO2: 25
Calcium: 7.2 — ABNORMAL LOW
Calcium: 9.4
Creatinine, Ser: 1.27 — ABNORMAL HIGH
Creatinine, Ser: 2.22 — ABNORMAL HIGH
GFR calc non Af Amer: 22 — ABNORMAL LOW
GFR calc non Af Amer: 43 — ABNORMAL LOW
Glucose, Bld: 120 — ABNORMAL HIGH
Glucose, Bld: 161 — ABNORMAL HIGH

## 2011-05-22 LAB — CROSSMATCH

## 2011-05-22 LAB — BLOOD GAS, ARTERIAL
Bicarbonate: 21.2
TCO2: 22.5
pH, Arterial: 7.326 — ABNORMAL LOW
pO2, Arterial: 76.2 — ABNORMAL LOW

## 2011-05-22 LAB — URINALYSIS, ROUTINE W REFLEX MICROSCOPIC
Glucose, UA: NEGATIVE
Hgb urine dipstick: NEGATIVE
Leukocytes, UA: NEGATIVE
Protein, ur: NEGATIVE
Specific Gravity, Urine: 1.02
Urobilinogen, UA: 0.2

## 2011-05-22 LAB — CBC
HCT: 26.7 — ABNORMAL LOW
Hemoglobin: 10.1 — ABNORMAL LOW
Hemoglobin: 8.6 — ABNORMAL LOW
MCHC: 32.4
MCHC: 33.1
MCV: 81.6
Platelets: 259
Platelets: 263
Platelets: 378
Platelets: 424 — ABNORMAL HIGH
RBC: 2.96 — ABNORMAL LOW
RBC: 3.39 — ABNORMAL LOW
RDW: 15.8 — ABNORMAL HIGH
RDW: 15.8 — ABNORMAL HIGH
RDW: 16.6 — ABNORMAL HIGH
WBC: 10.4
WBC: 12.6 — ABNORMAL HIGH
WBC: 12.9 — ABNORMAL HIGH

## 2011-05-22 LAB — TYPE AND SCREEN: ABO/RH(D): O POS

## 2011-05-22 LAB — COMPREHENSIVE METABOLIC PANEL
ALT: 37 — ABNORMAL HIGH
AST: 61 — ABNORMAL HIGH
Albumin: 2.3 — ABNORMAL LOW
Chloride: 97
Creatinine, Ser: 1.22 — ABNORMAL HIGH
GFR calc Af Amer: 54 — ABNORMAL LOW
Sodium: 135
Total Bilirubin: 0.8

## 2011-05-22 LAB — CULTURE, BLOOD (ROUTINE X 2)
Culture: NO GROWTH
Culture: NO GROWTH

## 2011-05-22 LAB — ABO/RH: ABO/RH(D): O POS

## 2011-05-22 LAB — B-NATRIURETIC PEPTIDE (CONVERTED LAB): Pro B Natriuretic peptide (BNP): 95

## 2011-07-01 ENCOUNTER — Encounter: Payer: Self-pay | Admitting: Critical Care Medicine

## 2011-07-01 ENCOUNTER — Ambulatory Visit (INDEPENDENT_AMBULATORY_CARE_PROVIDER_SITE_OTHER): Payer: Medicare Other | Admitting: Critical Care Medicine

## 2011-07-01 VITALS — BP 140/68 | HR 127 | Temp 98.4°F | Ht 59.0 in | Wt 229.0 lb

## 2011-07-01 DIAGNOSIS — J449 Chronic obstructive pulmonary disease, unspecified: Secondary | ICD-10-CM

## 2011-07-01 LAB — PULMONARY FUNCTION TEST

## 2011-07-01 MED ORDER — BUDESONIDE 0.25 MG/2ML IN SUSP: 0.2500 mg | Freq: Every day | RESPIRATORY_TRACT | Status: DC

## 2011-07-01 MED ORDER — FORMOTEROL FUMARATE 20 MCG/2ML IN NEBU: 20.0000 ug | INHALATION_SOLUTION | Freq: Two times a day (BID) | RESPIRATORY_TRACT | Status: DC

## 2011-07-01 NOTE — Progress Notes (Signed)
PFT done today. 

## 2011-07-01 NOTE — Progress Notes (Signed)
Subjective:    Patient ID: [name], female    DOB: 1945/12/24, 65 y.o.   MRN: [id number] 65 y.o. WF Sleep apnea and Copd last seen 2008 HPI Comments: Dx OSA on Cpap.    Shortness of Breath This is a chronic problem. The current episode started more than 1 year ago (worse over past one month). The problem has been gradually improving. Associated symptoms include rhinorrhea. Pertinent negatives include no abdominal pain, chest pain, ear pain, fever, headaches, hemoptysis, leg pain, leg swelling, neck pain, orthopnea, PND, rash, sore throat, sputum production, vomiting or wheezing. The symptoms are aggravated by any activity, smoke, fumes, lying flat, eating, odors and pollens. Associated symptoms comments: Notes chest tightness. Risk factors include smoking. She has tried beta agonist inhalers and steroid inhalers for the symptoms. The treatment provided moderate relief. Her past medical history is significant for bronchiolitis, chronic lung disease, COPD, a heart failure and pneumonia. There is no history of allergies, aspirin allergies, asthma, CAD, DVT or PE.  Cough This is a chronic problem. The current episode started more than 1 year ago. The problem has been rapidly improving. The cough is non-productive. Associated symptoms include heartburn, myalgias, nasal congestion, rhinorrhea and shortness of breath. Pertinent negatives include no chest pain, chills, ear pain, fever, headaches, hemoptysis, postnasal drip, rash, sore throat or wheezing. Associated symptoms comments: Heart burn is better . The symptoms are aggravated by fumes, exercise, dust, pollens, lying down and stress. She has tried steroid inhaler and a beta-agonist inhaler for the symptoms. The treatment provided mild relief. Her past medical history is significant for bronchitis, COPD, emphysema and pneumonia. There is no history of asthma, bronchiectasis or environmental allergies.   07/02/2011  Since last ov , now on BD neb  med, Reflux Rx and is improved.  Now dypsneic with exertion only. No real cough.  See symptoms above. Pt denies any significant sore throat, nasal congestion or excess secretions, fever, chills, sweats, unintended weight loss, pleurtic or exertional chest pain, orthopnea PND, or leg swelling Pt denies any increase in rescue therapy over baseline, denies waking up needing it or having any early am or nocturnal exacerbations of coughing/wheezing/or dyspnea. Pt also denies any obvious fluctuation in symptoms with  weather or environmental change or other alleviating or aggravating factors  .    Past Medical History  Diagnosis Date  . COPD (chronic obstructive pulmonary disease)   . Obesity   . Degenerative disc disease   . History of arthritis   . Vitamin B12 deficiency   . Iron deficiency anemia   . Sleep apnea   . Hypertension   . Mild hyperlipidemia   . DM (diabetes mellitus)   . History of hiatal hernia   . History of diverticulitis of colon   . Dysphagia, unspecified   . Dyspepsia   . Acid reflux   . Gout   . Neuropathy   . Congestive heart failure      Family History  Problem Relation Age of Onset  . Colon cancer Brother   . Ulcers Sister   . Ulcers Mother   . Diabetes Mother   . Heart attack Mother   . Stroke Mother   . Heart attack Brother   . Stroke Sister   . Diabetes Sister   . Heart attack Sister   . Kidney failure Sister   . Asthma Mother   . Asthma Sister   . Diabetes Brother   . Stroke Maternal Grandmother  History   Social History  . Marital Status: Married    Spouse Name: N/A    Number of Children: 4  . Years of Education: N/A   Occupational History  . Not on file.   Social History Main Topics  . Smoking status: Former Smoker -- 1.0 packs/day for 1 years    Types: Cigarettes    Quit date: 08/05/1961  . Smokeless tobacco: Never Used  . Alcohol Use: No  . Drug Use: No  . Sexually Active: Not on file   Other Topics Concern  . Not on  file   Social History Narrative  . No narrative on file     No Known Allergies   Outpatient Prescriptions Prior to Visit  Medication Sig Dispense Refill  . amitriptyline (ELAVIL) 100 MG tablet Take 100 mg by mouth at bedtime.        Marland Kitchen aspirin 81 MG tablet Take 81 mg by mouth daily.        Marland Kitchen esomeprazole (NEXIUM) 40 MG capsule Take 1 capsule (40 mg total) by mouth 2 (two) times daily before a meal.  60 capsule  6  . ezetimibe-simvastatin (VYTORIN) 10-80 MG per tablet Take 1 tablet by mouth at bedtime.        . furosemide (LASIX) 40 MG tablet Take 40 mg by mouth 2 (two) times daily.        Marland Kitchen glimepiride (AMARYL) 2 MG tablet Take 2 mg by mouth daily.        Marland Kitchen ipratropium-albuterol (DUONEB) 0.5-2.5 (3) MG/3ML SOLN Take 3 mLs by nebulization as needed.        . loratadine (CLARITIN) 10 MG tablet Take 10 mg by mouth daily as needed.       . nabumetone (RELAFEN) 750 MG tablet Take 750 mg by mouth 2 (two) times daily.        . valsartan (DIOVAN) 160 MG tablet Take 160 mg by mouth daily.        . budesonide (PULMICORT) 0.25 MG/2ML nebulizer solution Take 2 mLs (0.25 mg total) by nebulization daily.  120 mL  6  . formoterol (PERFOROMIST) 20 MCG/2ML nebulizer solution Take 2 mLs (20 mcg total) by nebulization 2 (two) times daily.  120 mL  6  . azithromycin (ZITHROMAX) 250 MG tablet Take two once then one daily until gone  6 each  0  . predniSONE (DELTASONE) 10 MG tablet Take 4 for two days three for two days two for two days one for two days  20 tablet  0       Review of Systems  Constitutional: Positive for fatigue. Negative for fever, chills, diaphoresis, activity change, appetite change and unexpected weight change.  HENT: Positive for rhinorrhea, sneezing, dental problem and ear discharge. Negative for hearing loss, ear pain, nosebleeds, congestion, sore throat, facial swelling, mouth sores, trouble swallowing, neck pain, neck stiffness, voice change, postnasal drip, sinus pressure and  tinnitus.   Eyes: Negative for photophobia, discharge, itching and visual disturbance.  Respiratory: Positive for shortness of breath. Negative for apnea, cough, hemoptysis, sputum production, choking, chest tightness, wheezing and stridor.   Cardiovascular: Negative for chest pain, palpitations, orthopnea, leg swelling and PND.  Gastrointestinal: Positive for heartburn. Negative for nausea, vomiting, abdominal pain, constipation, blood in stool and abdominal distention.  Genitourinary: Positive for urgency and frequency. Negative for dysuria, hematuria, flank pain, decreased urine volume and difficulty urinating.  Musculoskeletal: Positive for myalgias, back pain, joint swelling, arthralgias and gait problem.  Skin: Negative  for color change, pallor and rash.  Neurological: Positive for weakness and light-headedness. Negative for dizziness, tremors, seizures, syncope, speech difficulty, numbness and headaches.  Hematological: Negative for environmental allergies and adenopathy. Does not bruise/bleed easily.  Psychiatric/Behavioral: Positive for sleep disturbance. Negative for confusion and agitation. The patient is nervous/anxious.        Objective:   Physical Exam  Filed Vitals:   07/01/11 1551  BP: 140/68  Pulse: 127  Temp: 98.4 F (36.9 C)  TempSrc: Oral  Height: 4\' 11"  (1.499 m)  Weight: 229 lb (103.874 kg)  SpO2: 95%    Gen: Pleasant, obese , in no distress,  normal affect  ENT: No lesions,  mouth clear,  oropharynx clear, no postnasal drip  Neck: No JVD, no TMG, no carotid bruits  Lungs: No use of accessory muscles, no dullness to percussion, improved airflow  Cardiovascular: RRR, heart sounds normal, no murmur or gallops, no peripheral edema  Abdomen: soft and NT, no HSM,  BS normal  Musculoskeletal: No deformities, no cyanosis or clubbing  Neuro: alert, non focal  Skin: Warm, no lesions or rashes   CXR : LLL partial ATX,  Copd changes     Assessment & Plan:    COPD Chronic obstructive lung disease with asthmatic bronchitic component exacerbated by obesity and recurrent tracheobronchitis. The patient is an ex-smoker. Also reflux disease is playing a role. PFTs 11/12:  FeV1 100%  Fef 25-75  51% with 42% improvement after BD,  TLC 111%  DLCO 136% with [facility] correction  Improved with BD neb meds Plan Cont current Rx program Cont Reflux Rx rov 4 months      Updated Medication List Outpatient Encounter Prescriptions as of 07/01/2011  Medication Sig Dispense Refill  . amitriptyline (ELAVIL) 100 MG tablet Take 100 mg by mouth at bedtime.        . AMOXICILLIN PO Take by mouth. 1 tablet by mouth twice daily for dental procedure       . aspirin 81 MG tablet Take 81 mg by mouth daily.        . budesonide (PULMICORT) 0.25 MG/2ML nebulizer solution Take 2 mLs (0.25 mg total) by nebulization daily.  360 mL  4  . esomeprazole (NEXIUM) 40 MG capsule Take 1 capsule (40 mg total) by mouth 2 (two) times daily before a meal.  60 capsule  6  . ezetimibe-simvastatin (VYTORIN) 10-80 MG per tablet Take 1 tablet by mouth at bedtime.        . formoterol (PERFOROMIST) 20 MCG/2ML nebulizer solution Take 2 mLs (20 mcg total) by nebulization 2 (two) times daily.  360 mL  4  . furosemide (LASIX) 40 MG tablet Take 40 mg by mouth 2 (two) times daily.        Marland Kitchen glimepiride (AMARYL) 2 MG tablet Take 2 mg by mouth daily.        Marland Kitchen ipratropium-albuterol (DUONEB) 0.5-2.5 (3) MG/3ML SOLN Take 3 mLs by nebulization as needed.        . loratadine (CLARITIN) 10 MG tablet Take 10 mg by mouth daily as needed.       . nabumetone (RELAFEN) 750 MG tablet Take 750 mg by mouth 2 (two) times daily.        . valsartan (DIOVAN) 160 MG tablet Take 160 mg by mouth daily.        Marland Kitchen DISCONTD: budesonide (PULMICORT) 0.25 MG/2ML nebulizer solution Take 2 mLs (0.25 mg total) by nebulization daily.  120 mL  6  .  DISCONTD: formoterol (PERFOROMIST) 20 MCG/2ML nebulizer solution Take 2 mLs (20 mcg total)  by nebulization 2 (two) times daily.  120 mL  6  . DISCONTD: azithromycin (ZITHROMAX) 250 MG tablet Take two once then one daily until gone  6 each  0  . DISCONTD: predniSONE (DELTASONE) 10 MG tablet Take 4 for two days three for two days two for two days one for two days  20 tablet  0

## 2011-07-01 NOTE — Patient Instructions (Signed)
No change in medications. Return in         4 months 

## 2011-07-02 NOTE — Assessment & Plan Note (Addendum)
Chronic obstructive lung disease with asthmatic bronchitic component exacerbated by obesity and recurrent tracheobronchitis. The patient is an ex-smoker. Also reflux disease is playing a role. PFTs 11/12:  FeV1 100%  Fef 25-75  51% with 42% improvement after BD,  TLC 111%  DLCO 136% with [facility] correction  Improved with BD neb meds Plan Cont current Rx program Cont Reflux Rx rov 4 months

## 2011-12-09 DIAGNOSIS — R609 Edema, unspecified: Secondary | ICD-10-CM | POA: Insufficient documentation

## 2011-12-09 DIAGNOSIS — M109 Gout, unspecified: Secondary | ICD-10-CM | POA: Insufficient documentation

## 2011-12-09 DIAGNOSIS — M129 Arthropathy, unspecified: Secondary | ICD-10-CM | POA: Insufficient documentation

## 2011-12-23 ENCOUNTER — Other Ambulatory Visit: Payer: Self-pay | Admitting: Diagnostic Neuroimaging

## 2011-12-23 DIAGNOSIS — H4710 Unspecified papilledema: Secondary | ICD-10-CM

## 2012-01-01 ENCOUNTER — Ambulatory Visit
Admission: RE | Admit: 2012-01-01 | Discharge: 2012-01-01 | Disposition: A | Discharge status: Home / Self Care | Payer: Managed Care, Other (non HMO) | Source: Ambulatory Visit | Attending: Diagnostic Neuroimaging | Admitting: Diagnostic Neuroimaging

## 2012-01-01 DIAGNOSIS — H4710 Unspecified papilledema: Secondary | ICD-10-CM

## 2012-01-01 MED ORDER — GADOBENATE DIMEGLUMINE 529 MG/ML IV SOLN
20.0000 mL | Freq: Once | INTRAVENOUS | Status: AC | PRN
  Administered 2012-01-01: 20 mL via INTRAVENOUS

## 2012-03-14 DIAGNOSIS — J449 Chronic obstructive pulmonary disease, unspecified: Secondary | ICD-10-CM

## 2012-03-14 DIAGNOSIS — J4489 Other specified chronic obstructive pulmonary disease: Secondary | ICD-10-CM

## 2012-07-17 ENCOUNTER — Encounter: Payer: Managed Care, Other (non HMO) | Admitting: Internal Medicine

## 2012-09-05 DIAGNOSIS — R079 Chest pain, unspecified: Secondary | ICD-10-CM | POA: Insufficient documentation

## 2012-12-03 ENCOUNTER — Encounter: Payer: Self-pay | Admitting: *Deleted

## 2012-12-03 ENCOUNTER — Ambulatory Visit (INDEPENDENT_AMBULATORY_CARE_PROVIDER_SITE_OTHER): Payer: Managed Care, Other (non HMO) | Admitting: Cardiology

## 2012-12-03 ENCOUNTER — Encounter: Payer: Self-pay | Admitting: Cardiology

## 2012-12-03 VITALS — BP 137/74 | HR 89 | Ht 59.0 in | Wt 230.0 lb

## 2012-12-03 DIAGNOSIS — I10 Essential (primary) hypertension: Secondary | ICD-10-CM

## 2012-12-03 DIAGNOSIS — R079 Chest pain, unspecified: Secondary | ICD-10-CM

## 2012-12-03 DIAGNOSIS — Z87898 Personal history of other specified conditions: Secondary | ICD-10-CM

## 2012-12-03 DIAGNOSIS — R0602 Shortness of breath: Secondary | ICD-10-CM

## 2012-12-03 NOTE — Assessment & Plan Note (Signed)
Exertional and progressive, NYHA class III. Also associated with intermittent chest pressure. Recent ECGs reviewed. She has no history of obstructive CAD, reassuring cardiac catheterization 14 years ago with negative ischemic workup 5 years ago. She does have ongoing risk factors including hypertension, type 2 diabetes mellitus, hyperlipidemia, and also family history. We have discussed options for further evaluation, and plan will be to followup with a Lexiscan Myoview and echocardiogram for now. We will inform her of the results, and can determine if any further testing is needed.

## 2012-12-03 NOTE — Patient Instructions (Signed)
   Lexiscan Cardiolite (stress test)  Echo  Both test above to be done in the [place] office Continue all current medications. Follow up based on above test results

## 2012-12-03 NOTE — Assessment & Plan Note (Signed)
Patient states blood pressure was increasing significantly when she was doing rehabilitation after her knee surgery.

## 2012-12-03 NOTE — Progress Notes (Signed)
Clinical Summary Ms. Deshpande is a 67 y.o.female referred for cardiology consultation by Bryan Lemma NP. She reports a one-year history of intermittent dyspnea on exertion up to NYHA class III, also intermittent chest pressure. These symptoms have been more notable in the last few months. She states that she had arthroscopic right knee surgery and was undergoing rehabilitation, at which her symptoms also seemed to be worse. She has also had increased blood pressure.  Record review indicates previous evaluation by Dr. Dannielle Burn, office note from 2008 documenting prior history of nonobstructive CAD at catheterization in 2000 with nonischemic Myoview in 2007. ECG from February 2013 reviewed finding sinus rhythm with nonspecific ST/T-wave changes.  She reports compliance with her medications. She has no rest symptoms. No palpitations or syncope.  She has not undergone a followup ischemic or cardiac structural testing in the last 5 years.  No Known Allergies  Current Outpatient Prescriptions  Medication Sig Dispense Refill  . allopurinol (ZYLOPRIM) 100 MG tablet Take 1 tablet by mouth 2 (two) times daily.      Marland Kitchen amitriptyline (ELAVIL) 100 MG tablet Take 100 mg by mouth at bedtime.        Marland Kitchen aspirin 81 MG tablet Take 81 mg by mouth daily.        Marland Kitchen atorvastatin (LIPITOR) 40 MG tablet Take 1 tablet by mouth daily.      Marland Kitchen esomeprazole (NEXIUM) 40 MG capsule Take 40 mg by mouth daily before breakfast.      . furosemide (LASIX) 40 MG tablet Take 40 mg by mouth daily.       Marland Kitchen HUMALOG KWIKPEN 100 UNIT/ML SOPN 14 Units 2 (two) times daily.      Marland Kitchen LANTUS SOLOSTAR 100 UNIT/ML SOPN 22 Units at bedtime.      Marland Kitchen loratadine (CLARITIN) 10 MG tablet Take 10 mg by mouth daily as needed.       . metFORMIN (GLUCOPHAGE-XR) 500 MG 24 hr tablet Take 1 tablet by mouth 2 (two) times daily.      . metoprolol succinate (TOPROL-XL) 50 MG 24 hr tablet Take 1 tablet by mouth daily.      . ONE TOUCH ULTRA TEST test strip         . ONETOUCH DELICA LANCETS 99991111 MISC       . RELION INSULIN SYR 1CC/30G 30G X 5/16" 1 ML MISC       . SURE COMFORT PEN NEEDLES 31G X 5 MM MISC       . AMOXICILLIN PO Take by mouth. 1 tablet by mouth twice daily for dental procedure        No current facility-administered medications for this visit.    Past Medical History  Diagnosis Date  . COPD (chronic obstructive pulmonary disease)   . Degenerative disc disease   . History of arthritis   . Vitamin B12 deficiency   . Iron deficiency anemia   . Sleep apnea   . Essential hypertension, benign   . Mixed hyperlipidemia   . Type 2 diabetes mellitus   . History of hiatal hernia   . History of diverticulitis of colon   . Acid reflux   . Gout   . Neuropathy     Past Surgical History  Procedure Laterality Date  . Esophagogastroduodenoscopy    . Colonoscopy  2008  . Cholecystectomy    . Partial hysterectomy  1978  . Tonsillectomy and adenoidectomy    . Umbilical hernia repair  2010  . Benign breast cysts    .  Two back surgeries/fusion      Family History  Problem Relation Age of Onset  . Colon cancer Brother   . Ulcers Sister   . Ulcers Mother   . Diabetes Mother   . Heart attack Mother   . Stroke Mother   . Heart attack Brother   . Stroke Sister   . Diabetes Sister   . Heart attack Sister   . Kidney failure Sister   . Asthma Mother   . Asthma Sister   . Diabetes Brother   . Stroke Maternal Grandmother     Social History Ms. Selle reports that she quit smoking about 51 years ago. Her smoking use included Cigarettes. She has a 1 pack-year smoking history. She has never used smokeless tobacco. Ms. Chamberland reports that she does not drink alcohol.  Review of Systems Chronic knee pain. No orthopnea or PND. No cough, fevers or chills. Stable appetite. Otherwise negative except as outlined.  Physical Examination Filed Vitals:   12/03/12 0955  BP: 137/74  Pulse: 89   Filed Weights   12/03/12 0955  Weight: 230 lb  (104.327 kg)   Overweight short statured woman in no acute distress. HEENT: Conjunctiva and lids normal, oropharynx clear. Neck: Supple, no elevated JVP or carotid bruits, no thyromegaly. Increased girth. Lungs: Clear to auscultation, nonlabored breathing at rest. Cardiac: Regular rate and rhythm, no S3, 2/6 systolic murmur, no pericardial rub. Abdomen: Soft, nontender, bowel sounds present. Extremities: 2+ chronic edema, distal pulses 1-2+. Skin: Warm and dry. Musculoskeletal: No kyphosis. Neuropsychiatric: Alert and oriented x3, affect grossly appropriate.   Problem List and Plan   Shortness of breath Exertional and progressive, NYHA class III. Also associated with intermittent chest pressure. Recent ECGs reviewed. She has no history of obstructive CAD, reassuring cardiac catheterization 14 years ago with negative ischemic workup 5 years ago. She does have ongoing risk factors including hypertension, type 2 diabetes mellitus, hyperlipidemia, and also family history. We have discussed options for further evaluation, and plan will be to followup with a Lexiscan Myoview and echocardiogram for now. We will inform her of the results, and can determine if any further testing is needed.  HYPERLIPIDEMIA, MILD, HX OF On atorvastatin, followed by primary care.  Essential hypertension, benign Patient states blood pressure was increasing significantly when she was doing rehabilitation after her knee surgery.    Satira Sark, M.D., F.A.C.C.

## 2012-12-03 NOTE — Assessment & Plan Note (Signed)
On atorvastatin, followed by primary care.

## 2012-12-08 ENCOUNTER — Ambulatory Visit (HOSPITAL_COMMUNITY): Payer: Managed Care, Other (non HMO) | Attending: Cardiology | Admitting: Radiology

## 2012-12-08 VITALS — BP 137/68 | Ht 59.0 in | Wt 230.0 lb

## 2012-12-08 DIAGNOSIS — R0789 Other chest pain: Secondary | ICD-10-CM

## 2012-12-08 DIAGNOSIS — R0602 Shortness of breath: Secondary | ICD-10-CM | POA: Insufficient documentation

## 2012-12-08 DIAGNOSIS — R079 Chest pain, unspecified: Secondary | ICD-10-CM

## 2012-12-08 MED ORDER — TECHNETIUM TC 99M SESTAMIBI GENERIC - CARDIOLITE
33.0000 | Freq: Once | INTRAVENOUS | Status: AC | PRN
  Administered 2012-12-08: 33 via INTRAVENOUS

## 2012-12-08 MED ORDER — REGADENOSON 0.4 MG/5ML IV SOLN
0.4000 mg | Freq: Once | INTRAVENOUS | Status: AC
  Administered 2012-12-08: 0.4 mg via INTRAVENOUS

## 2012-12-08 NOTE — Progress Notes (Signed)
[facility] [address] [place], [place] [address] [phone]    Cardiology Nuclear Med Study  [name] is a 67 y.o. female     MRN : [id number]     DOB: [birth year]  Procedure Date: 12/08/2012  Nuclear Med Background Indication for Stress Test:  Evaluation for Ischemia History:  COPD and Abnormal EKG, 2000 Heart CCath-N/O CAD No Report, 2007 MPS: (-) ischemia no report Cardiac Risk Factors: Family History - CAD, History of Smoking, Hypertension, IDDM Type 2 and Lipids  Symptoms:  Chest Pressure.  (last date of chest discomfort entermittent), DOE and SOB   Nuclear Pre-Procedure Caffeine/Decaff Intake:  None > 12 hrs NPO After: 7:00pm   Lungs:  clear O2 Sat: 91% on room air. IV 0.9% NS with Angio Cath:  24g  IV Site: L Forearm x 1, tolerated well IV Started by:  Irven Baltimore, RN  Chest Size (in):  42 Cup Size: C  Height: 4\' 11"  (1.499 m)  Weight:  230 lb (104.327 kg)  BMI:  Body mass index is 46.43 kg/(m^2). Tech Comments:  Full dose insulins last night; no insulin today. No medications today. FBS was 168 @ 9:15am . Irven Baltimore, RN    Nuclear Med Study 1 or 2 day study: 2 day  Stress Test Type:  Carlton Adam  Reading MD: Jenkins Rouge, MD  Order Authorizing Provider:  [name], MD  Resting Radionuclide: Technetium 77m Sestamibi  Resting Radionuclide Dose: 30.0 mCi on 12/09/12  Stress Radionuclide:  Technetium 44m Sestamibi  Stress Radionuclide Dose: 33.0 mCi on 12/08/12           Stress Protocol Rest HR: 91 Stress HR: 103  Rest BP: 137/68 Stress BP: 149/60  Exercise Time (min): n/a METS: n/a   Predicted Max HR: 153 bpm % Max HR: 67.32 bpm Rate Pressure Product: 15347   Dose of Adenosine (mg):  n/a Dose of Lexiscan: 0.4 mg  Dose of Atropine (mg): n/a Dose of Dobutamine: n/a mcg/kg/min (at max HR)  Stress Test Technologist: Perrin Maltese, EMT-P  Nuclear Technologist:  Charlton Amor, CNMT     Rest Procedure:  Myocardial  perfusion imaging was performed at rest 45 minutes following the intravenous administration of Technetium 23m Sestamibi. Rest ECG: NSR - Normal EKG  Stress Procedure:  The patient received IV Lexiscan 0.4 mg over 15-seconds.  Technetium 16m Sestamibi injected at 30-seconds. This patient had sob, was flushed, and had a headache with the Lexiscan injection. Quantitative spect images were obtained after a 45 minute delay. Stress ECG: No significant change from baseline ECG  QPS Raw Data Images:  Patient motion noted. Stress Images:  Normal homogeneous uptake in all areas of the myocardium. Rest Images:  Normal homogeneous uptake in all areas of the myocardium. Subtraction (SDS):  Normal Transient Ischemic Dilatation (Normal <1.22):  1.10 Lung/Heart Ratio (Normal <0.45):  0.39  Quantitative Gated Spect Images QGS EDV:  88 ml QGS ESV:  26 ml  Impression Exercise Capacity:  Lexiscan with no exercise. BP Response:  Normal blood pressure response. Clinical Symptoms:  Flushed ECG Impression:  No significant ST segment change suggestive of ischemia. Comparison with Prior Nuclear Study: No images to compare  Overall Impression:  Normal stress nuclear study.  LV Ejection Fraction: 70%.  LV Wall Motion:  NL LV Function; NL Wall Motion  Jenkins Rouge

## 2012-12-09 ENCOUNTER — Ambulatory Visit (HOSPITAL_BASED_OUTPATIENT_CLINIC_OR_DEPARTMENT_OTHER): Payer: Managed Care, Other (non HMO) | Admitting: Radiology

## 2012-12-09 ENCOUNTER — Ambulatory Visit (HOSPITAL_COMMUNITY): Payer: Managed Care, Other (non HMO) | Attending: Cardiology | Admitting: Radiology

## 2012-12-09 DIAGNOSIS — I10 Essential (primary) hypertension: Secondary | ICD-10-CM | POA: Insufficient documentation

## 2012-12-09 DIAGNOSIS — G4730 Sleep apnea, unspecified: Secondary | ICD-10-CM | POA: Insufficient documentation

## 2012-12-09 DIAGNOSIS — E119 Type 2 diabetes mellitus without complications: Secondary | ICD-10-CM | POA: Insufficient documentation

## 2012-12-09 DIAGNOSIS — R0602 Shortness of breath: Secondary | ICD-10-CM | POA: Insufficient documentation

## 2012-12-09 DIAGNOSIS — R0989 Other specified symptoms and signs involving the circulatory and respiratory systems: Secondary | ICD-10-CM

## 2012-12-09 DIAGNOSIS — I2510 Atherosclerotic heart disease of native coronary artery without angina pectoris: Secondary | ICD-10-CM | POA: Insufficient documentation

## 2012-12-09 DIAGNOSIS — J449 Chronic obstructive pulmonary disease, unspecified: Secondary | ICD-10-CM | POA: Insufficient documentation

## 2012-12-09 DIAGNOSIS — J4489 Other specified chronic obstructive pulmonary disease: Secondary | ICD-10-CM | POA: Insufficient documentation

## 2012-12-09 DIAGNOSIS — R079 Chest pain, unspecified: Secondary | ICD-10-CM | POA: Insufficient documentation

## 2012-12-09 DIAGNOSIS — E785 Hyperlipidemia, unspecified: Secondary | ICD-10-CM | POA: Insufficient documentation

## 2012-12-09 MED ORDER — TECHNETIUM TC 99M SESTAMIBI GENERIC - CARDIOLITE
33.0000 | Freq: Once | INTRAVENOUS | Status: AC | PRN
  Administered 2012-12-09: 33 via INTRAVENOUS

## 2012-12-09 NOTE — Progress Notes (Signed)
Echocardiogram performed.  

## 2012-12-10 ENCOUNTER — Telehealth: Payer: Self-pay | Admitting: *Deleted

## 2012-12-10 NOTE — Telephone Encounter (Signed)
Message copied by Merlene Laughter on Thu Dec 10, 2012 11:37 AM ------      Message from: MCDOWELL, Aloha Gell      Created: Thu Dec 10, 2012  9:45 AM       Please see result summary below. Study does not indicate ischemia and shows normal LVEF. At this point would not pursue fuirther cardiac testing unless her symptoms worsen. Can make a followup visit over the next three months.            Impression      Exercise Capacity:  Lexiscan with no exercise.      BP Response:  Normal blood pressure response.      Clinical Symptoms:  Flushed      ECG Impression:  No significant ST segment change suggestive of ischemia.      Comparison with Prior Nuclear Study: No images to compare            Overall Impression:  Normal stress nuclear study.            LV Ejection Fraction: 70%.  LV Wall Motion:  NL LV Function; NL Wall Motion            Jenkins Rouge       ------

## 2012-12-10 NOTE — Telephone Encounter (Signed)
Patient informed. 

## 2012-12-10 NOTE — Telephone Encounter (Signed)
Message copied by Merlene Laughter on Thu Dec 10, 2012 11:35 AM ------      Message from: MCDOWELL, Aloha Gell      Created: Thu Dec 10, 2012  8:08 AM       Normal LV systolic function at 0000000. No major valvular abnormalities noted. Will await stress testing. ------

## 2013-03-15 ENCOUNTER — Ambulatory Visit: Payer: Managed Care, Other (non HMO) | Admitting: Cardiology

## 2013-04-29 ENCOUNTER — Ambulatory Visit: Payer: Managed Care, Other (non HMO) | Admitting: Cardiology

## 2013-05-03 ENCOUNTER — Encounter: Payer: Self-pay | Admitting: Cardiology

## 2013-05-04 ENCOUNTER — Encounter: Payer: Self-pay | Admitting: Cardiology

## 2013-05-04 ENCOUNTER — Ambulatory Visit: Payer: Managed Care, Other (non HMO) | Admitting: Cardiology

## 2013-05-04 LAB — PULMONARY FUNCTION TEST

## 2013-05-11 ENCOUNTER — Encounter: Payer: Self-pay | Admitting: Cardiology

## 2013-05-11 ENCOUNTER — Ambulatory Visit (INDEPENDENT_AMBULATORY_CARE_PROVIDER_SITE_OTHER): Payer: Managed Care, Other (non HMO) | Admitting: Cardiology

## 2013-05-11 VITALS — BP 118/73 | HR 98 | Ht 59.0 in | Wt 224.0 lb

## 2013-05-11 DIAGNOSIS — J449 Chronic obstructive pulmonary disease, unspecified: Secondary | ICD-10-CM

## 2013-05-11 DIAGNOSIS — R0602 Shortness of breath: Secondary | ICD-10-CM

## 2013-05-11 DIAGNOSIS — I10 Essential (primary) hypertension: Secondary | ICD-10-CM

## 2013-05-11 NOTE — Progress Notes (Signed)
Clinical Summary [name] is a 67 y.o.female last seen in May. She was referred for followup cardiac testing with remote history of nonobstructive CAD at catheterization in 2000. Recent echocardiogram in May showed LVEF 55-60%, mild left atrial enlargement, no major valvular abnormalities, PASP 35 mm mercury. Lexiscan Myoview was negative for ischemia with normal LVEF 70%.  Record review finds recent hospitalization at [facility] with COPD exacerbation. She did have a 6 minute walk during evaluation showing desaturation and also exaggerated heart rate response.   She tells me that she will be following up with Dr. Joya Gaskins in [place] for lung disease. She is using oxygen as needed at home, also at nighttime, states that this has made a big difference in her shortness of breath. She is still on a prednisone taper and antibiotics.  No Known Allergies  Current Outpatient Prescriptions  Medication Sig Dispense Refill  . allopurinol (ZYLOPRIM) 100 MG tablet Take 1 tablet by mouth 2 (two) times daily.      Marland Kitchen amitriptyline (ELAVIL) 100 MG tablet Take 100 mg by mouth at bedtime.        . AMOXICILLIN PO Take by mouth. 1 tablet by mouth twice daily for dental procedure       . aspirin 81 MG tablet Take 81 mg by mouth daily.        Marland Kitchen atorvastatin (LIPITOR) 40 MG tablet Take 1 tablet by mouth daily.      . benzonatate (TESSALON) 100 MG capsule Take 100 mg by mouth 3 (three) times daily as needed.       . cyclobenzaprine (FLEXERIL) 10 MG tablet Take 10 mg by mouth 3 (three) times daily as needed.       Marland Kitchen esomeprazole (NEXIUM) 40 MG capsule Take 40 mg by mouth daily before breakfast.      . furosemide (LASIX) 40 MG tablet Take 40 mg by mouth daily.       Marland Kitchen HUMALOG KWIKPEN 100 UNIT/ML SOPN 14 Units 2 (two) times daily.      Marland Kitchen HYDROcodone-acetaminophen (NORCO/VICODIN) 5-325 MG per tablet Take 1 tablet by mouth every 8 (eight) hours as needed.       Marland Kitchen ipratropium-albuterol (DUONEB) 0.5-2.5 (3) MG/3ML  SOLN 3 mLs every 4 (four) hours as needed.       Marland Kitchen LANTUS SOLOSTAR 100 UNIT/ML SOPN 20 Units at bedtime.       Marland Kitchen levofloxacin (LEVAQUIN) 500 MG tablet Take 500 mg by mouth daily.       Marland Kitchen loratadine (CLARITIN) 10 MG tablet Take 10 mg by mouth daily as needed.       . metFORMIN (GLUCOPHAGE) 1000 MG tablet Take 1,000 mg by mouth 2 (two) times daily with a meal.      . metoprolol succinate (TOPROL-XL) 25 MG 24 hr tablet Take 25 mg by mouth daily.      . ONE TOUCH ULTRA TEST test strip       . ONETOUCH DELICA LANCETS 99991111 MISC       . predniSONE (DELTASONE) 20 MG tablet Take 20 mg by mouth 2 (two) times daily.       Marland Kitchen RELION INSULIN SYR 1CC/30G 30G X 5/16" 1 ML MISC       . SURE COMFORT PEN NEEDLES 31G X 5 MM MISC        No current facility-administered medications for this visit.    Past Medical History  Diagnosis Date  . COPD (chronic obstructive pulmonary disease)   . Degenerative  disc disease   . Vitamin B12 deficiency   . Iron deficiency anemia   . Sleep apnea   . Essential hypertension, benign   . Mixed hyperlipidemia   . Type 2 diabetes mellitus   . History of hiatal hernia   . History of diverticulitis of colon   . GERD (gastroesophageal reflux disease)   . Gout   . Neuropathy     Social History Ms. Worman reports that she quit smoking about 51 years ago. Her smoking use included Cigarettes. She has a 1 pack-year smoking history. She has never used smokeless tobacco. Ms. Neumeister reports that she does not drink alcohol.  Review of Systems No palpitations or syncope. No anginal chest pain. Productive cough. No fevers or chills. Stable appetite. Otherwise negative.  Physical Examination Filed Vitals:   05/11/13 0901  BP: 118/73  Pulse: 98   Filed Weights   05/11/13 0901  Weight: 224 lb (101.606 kg)    Overweight short statured woman in no acute distress.  HEENT: Conjunctiva and lids normal, oropharynx clear.  Neck: Supple, no elevated JVP or carotid bruits, no  thyromegaly. Increased girth.  Lungs: Decreased breath sounds without wheeze, nonlabored breathing at rest.  Cardiac: Regular rate and rhythm, no S3, 2/6 systolic murmur, no pericardial rub.  Abdomen: Soft, nontender, bowel sounds present.  Extremities: 1-2+ chronic edema, distal pulses 1-2+.  Skin: Warm and dry.  Musculoskeletal: No kyphosis.  Neuropsychiatric: Alert and oriented x3, affect grossly appropriate.   Problem List and Plan   Shortness of breath Principally pulmonary, recent COPD exacerbation, also documented ambulatory desaturations. She is feeling better on supplmental oxygen, continues on prednisone taper and antibiotics. Followup will be with Dr. Joya Gaskins in [place]. From a cardiac perspective, she has had recent documentation of preserved LVEF, no major valvular abnormalities, and no ischemia by [facility]. No other cardiac testing planned at this time. Her followup can be as needed.  COPD Keep followup with Dr. Joya Gaskins.  Essential hypertension, benign Blood pressure is normal today.    Satira Sark, M.D., F.A.C.C.

## 2013-05-11 NOTE — Patient Instructions (Addendum)
Your physician recommends that you schedule a follow-up appointment in: as needed Your physician recommends that you continue on your current medications as directed. Please refer to the Current Medication list given to you today.   

## 2013-05-11 NOTE — Assessment & Plan Note (Signed)
Principally pulmonary, recent COPD exacerbation, also documented ambulatory desaturations. She is feeling better on supplmental oxygen, continues on prednisone taper and antibiotics. Followup will be with Dr. Joya Gaskins in [place]. From a cardiac perspective, she has had recent documentation of preserved LVEF, no major valvular abnormalities, and no ischemia by [facility]. No other cardiac testing planned at this time. Her followup can be as needed.

## 2013-05-11 NOTE — Assessment & Plan Note (Signed)
Keep followup with Dr. Joya Gaskins.

## 2013-05-11 NOTE — Assessment & Plan Note (Signed)
Blood pressure is normal today. 

## 2013-06-01 ENCOUNTER — Encounter: Payer: Self-pay | Admitting: Critical Care Medicine

## 2013-06-01 ENCOUNTER — Ambulatory Visit (INDEPENDENT_AMBULATORY_CARE_PROVIDER_SITE_OTHER): Payer: Managed Care, Other (non HMO) | Admitting: Critical Care Medicine

## 2013-06-01 VITALS — BP 140/84 | HR 105 | Temp 98.4°F | Ht 59.0 in | Wt 228.8 lb

## 2013-06-01 DIAGNOSIS — J441 Chronic obstructive pulmonary disease with (acute) exacerbation: Secondary | ICD-10-CM

## 2013-06-01 DIAGNOSIS — J449 Chronic obstructive pulmonary disease, unspecified: Secondary | ICD-10-CM

## 2013-06-01 MED ORDER — PREDNISONE 10 MG PO TABS: ORAL_TABLET | ORAL | Status: DC

## 2013-06-01 MED ORDER — FAMOTIDINE 40 MG PO TABS: 40.0000 mg | ORAL_TABLET | Freq: Every day | ORAL | Status: DC

## 2013-06-01 MED ORDER — BUDESONIDE-FORMOTEROL FUMARATE 160-4.5 MCG/ACT IN AERO: 2.0000 | INHALATION_SPRAY | Freq: Two times a day (BID) | RESPIRATORY_TRACT | Status: DC

## 2013-06-01 NOTE — Progress Notes (Signed)
Subjective:    Patient ID: [name], female    DOB: 1946/03/31, 67 y.o.   MRN: [id number] 67 y.o. WF Sleep apnea and Copd last seen 2008 HPI Comments:     06/02/2013 Chief Complaint  Patient presents with  . Follow-up    Last seen 07/01/2011. Pt reports she has good and bad days with her breathing. She was in the [facility] the beginning of this month d/t her breathing x 3 days. Pt has cough and brings up green phlem in the mornings and then clears throughout the day. Wheezing at times but not very much chest tx.    Not seen in two years. Since last ov still with cough, is dry Notes some wheezing . Notes some dyspnea with activity On 2L with cpap qhs and prn O2 day time Pt just started on spiriva after just got out of hosp 05/05/13.  Adm for copd exac. No pna dx.   .    Past Medical History  Diagnosis Date  . COPD (chronic obstructive pulmonary disease)   . Degenerative disc disease   . Vitamin B12 deficiency   . Iron deficiency anemia   . Sleep apnea   . Essential hypertension, benign   . Mixed hyperlipidemia   . Type 2 diabetes mellitus   . History of hiatal hernia   . History of diverticulitis of colon   . GERD (gastroesophageal reflux disease)   . Gout   . Neuropathy      Family History  Problem Relation Age of Onset  . Colon cancer Brother   . Ulcers Sister   . Ulcers Mother   . Diabetes Mother   . Heart attack Mother   . Stroke Mother   . Heart attack Brother   . Stroke Sister   . Diabetes Sister   . Heart attack Sister   . Kidney failure Sister   . Asthma Mother   . Asthma Sister   . Diabetes Brother   . Stroke Maternal Grandmother      History   Social History  . Marital Status: Married    Spouse Name: N/A    Number of Children: 4  . Years of Education: N/A   Occupational History  . Not on file.   Social History Main Topics  . Smoking status: Former Smoker -- 1.00 packs/day for 1 years    Types: Cigarettes    Quit date:  08/05/1961  . Smokeless tobacco: Never Used  . Alcohol Use: No  . Drug Use: No  . Sexual Activity: Not on file   Other Topics Concern  . Not on file   Social History Narrative  . No narrative on file     No Known Allergies   Outpatient Prescriptions Prior to Visit  Medication Sig Dispense Refill  . allopurinol (ZYLOPRIM) 100 MG tablet Take 1 tablet by mouth 2 (two) times daily.      Marland Kitchen amitriptyline (ELAVIL) 100 MG tablet Take 100 mg by mouth at bedtime.        . AMOXICILLIN PO Take by mouth. 1 tablet by mouth twice daily for dental procedure       . aspirin 81 MG tablet Take 81 mg by mouth daily.        Marland Kitchen atorvastatin (LIPITOR) 40 MG tablet Take 1 tablet by mouth daily.      Marland Kitchen esomeprazole (NEXIUM) 40 MG capsule Take 40 mg by mouth daily before breakfast.      . furosemide (LASIX)  40 MG tablet Take 40 mg by mouth daily.       Marland Kitchen HUMALOG KWIKPEN 100 UNIT/ML SOPN 14 Units 2 (two) times daily.      Marland Kitchen HYDROcodone-acetaminophen (NORCO/VICODIN) 5-325 MG per tablet Take 1 tablet by mouth every 8 (eight) hours as needed.       Marland Kitchen ipratropium-albuterol (DUONEB) 0.5-2.5 (3) MG/3ML SOLN 3 mLs 3 (three) times daily.       Marland Kitchen LANTUS SOLOSTAR 100 UNIT/ML SOPN 20 Units at bedtime.       Marland Kitchen loratadine (CLARITIN) 10 MG tablet Take 10 mg by mouth daily as needed.       . metFORMIN (GLUCOPHAGE) 1000 MG tablet Take 1,000 mg by mouth 2 (two) times daily with a meal.      . metoprolol succinate (TOPROL-XL) 25 MG 24 hr tablet Take 50 mg by mouth daily.       . ONE TOUCH ULTRA TEST test strip       . ONETOUCH DELICA LANCETS 99991111 MISC       . RELION INSULIN SYR 1CC/30G 30G X 5/16" 1 ML MISC       . SURE COMFORT PEN NEEDLES 31G X 5 MM MISC       . benzonatate (TESSALON) 100 MG capsule Take 100 mg by mouth 3 (three) times daily as needed.       . cyclobenzaprine (FLEXERIL) 10 MG tablet Take 10 mg by mouth 3 (three) times daily as needed.       Marland Kitchen levofloxacin (LEVAQUIN) 500 MG tablet Take 500 mg by mouth daily.        . predniSONE (DELTASONE) 20 MG tablet Take 20 mg by mouth 2 (two) times daily.        No facility-administered medications prior to visit.       Review of Systems  Constitutional: Positive for fatigue. Negative for diaphoresis, activity change, appetite change and unexpected weight change.  HENT: Positive for dental problem, ear discharge and sneezing. Negative for congestion, facial swelling, hearing loss, mouth sores, nosebleeds, sinus pressure, tinnitus, trouble swallowing and voice change.   Eyes: Negative for photophobia, discharge, itching and visual disturbance.  Respiratory: Negative for apnea, choking, chest tightness and stridor.   Cardiovascular: Negative for palpitations.  Gastrointestinal: Negative for nausea, constipation, blood in stool and abdominal distention.  Genitourinary: Positive for urgency and frequency. Negative for dysuria, hematuria, flank pain, decreased urine volume and difficulty urinating.  Musculoskeletal: Positive for arthralgias, back pain, gait problem and joint swelling. Negative for neck stiffness.  Skin: Negative for color change and pallor.  Neurological: Positive for weakness and light-headedness. Negative for dizziness, tremors, seizures, syncope, speech difficulty and numbness.  Hematological: Negative for adenopathy. Does not bruise/bleed easily.  Psychiatric/Behavioral: Positive for sleep disturbance. Negative for confusion and agitation. The patient is nervous/anxious.        Objective:   Physical Exam  Filed Vitals:   06/01/13 1631 06/01/13 1632  BP:  140/84  Pulse:  105  Temp: 98.4 F (36.9 C)   TempSrc: Oral   Height: 4\' 11"  (1.499 m)   Weight: 228 lb 12.8 oz (103.783 kg)   SpO2:  97%    Gen: Pleasant, obese , in no distress,  normal affect  ENT: No lesions,  mouth clear,  oropharynx clear, no postnasal drip  Neck: No JVD, no TMG, no carotid bruits  Lungs: No use of accessory muscles, no dullness to percussion,exp  wheezes  Cardiovascular: RRR, heart sounds normal, no murmur or gallops,  no peripheral edema  Abdomen: soft and NT, no HSM,  BS normal  Musculoskeletal: No deformities, no cyanosis or clubbing  Neuro: alert, non focal  Skin: Warm, no lesions or rashes        Assessment & Plan:   Obstructive chronic bronchitis with exacerbation Chronic obstructive lung disease with asthmatic bronchitic component exacerbated by obesity and recurrent tracheobronchitis. The patient is an ex-smoker. Also reflux disease is playing a role. PFTs 11/12:  FeV1 100%  Fef 25-75  51% with 42% improvement after BD,  TLC 111%  DLCO 136% with VA correction  Now with recurrent exacerbation Plan Inadequate control on spiriva alone Pepcid 40mg  at bedtime Stay on nexium in Daytime before meal once Start Symbicort two puff twice daily Prednisone 10mg  Take 4 for three days 3 for three days 2 for three days 1 for three days and stop STOP spiriva Follow reflux diet Return 3 months      Updated Medication List Outpatient Encounter Prescriptions as of 06/01/2013  Medication Sig Dispense Refill  . allopurinol (ZYLOPRIM) 100 MG tablet Take 1 tablet by mouth 2 (two) times daily.      Marland Kitchen amitriptyline (ELAVIL) 100 MG tablet Take 100 mg by mouth at bedtime.        . AMOXICILLIN PO Take by mouth. 1 tablet by mouth twice daily for dental procedure       . aspirin 81 MG tablet Take 81 mg by mouth daily.        Marland Kitchen atorvastatin (LIPITOR) 40 MG tablet Take 1 tablet by mouth daily.      Marland Kitchen dextromethorphan-guaiFENesin (MUCINEX DM) 30-600 MG per 12 hr tablet Take 1 tablet by mouth daily.      Marland Kitchen esomeprazole (NEXIUM) 40 MG capsule Take 40 mg by mouth daily before breakfast.      . furosemide (LASIX) 40 MG tablet Take 40 mg by mouth daily.       Marland Kitchen HUMALOG KWIKPEN 100 UNIT/ML SOPN 14 Units 2 (two) times daily.      Marland Kitchen HYDROcodone-acetaminophen (NORCO/VICODIN) 5-325 MG per tablet Take 1 tablet by mouth every 8 (eight) hours as  needed.       Marland Kitchen ipratropium-albuterol (DUONEB) 0.5-2.5 (3) MG/3ML SOLN 3 mLs 3 (three) times daily.       Marland Kitchen LANTUS SOLOSTAR 100 UNIT/ML SOPN 20 Units at bedtime.       Marland Kitchen loratadine (CLARITIN) 10 MG tablet Take 10 mg by mouth daily as needed.       . metFORMIN (GLUCOPHAGE) 1000 MG tablet Take 1,000 mg by mouth 2 (two) times daily with a meal.      . metoprolol succinate (TOPROL-XL) 25 MG 24 hr tablet Take 50 mg by mouth daily.       . naproxen sodium (ANAPROX) 220 MG tablet Take 220 mg by mouth as needed.      . ONE TOUCH ULTRA TEST test strip       . ONETOUCH DELICA LANCETS 99991111 MISC       . RELION INSULIN SYR 1CC/30G 30G X 5/16" 1 ML MISC       . SURE COMFORT PEN NEEDLES 31G X 5 MM MISC       . [DISCONTINUED] tiotropium (SPIRIVA) 18 MCG inhalation capsule Place 18 mcg into inhaler and inhale daily.      . budesonide-formoterol (SYMBICORT) 160-4.5 MCG/ACT inhaler Inhale 2 puffs into the lungs 2 (two) times daily.  1 Inhaler  12  . famotidine (PEPCID) 40 MG tablet Take  1 tablet (40 mg total) by mouth at bedtime.  30 tablet  6  . predniSONE (DELTASONE) 10 MG tablet Take 4 for three days 3 for three days 2 for three days 1 for three days and stop  30 tablet  0  . [DISCONTINUED] benzonatate (TESSALON) 100 MG capsule Take 100 mg by mouth 3 (three) times daily as needed.       . [DISCONTINUED] cyclobenzaprine (FLEXERIL) 10 MG tablet Take 10 mg by mouth 3 (three) times daily as needed.       . [DISCONTINUED] levofloxacin (LEVAQUIN) 500 MG tablet Take 500 mg by mouth daily.       . [DISCONTINUED] predniSONE (DELTASONE) 20 MG tablet Take 20 mg by mouth 2 (two) times daily.        No facility-administered encounter medications on file as of 06/01/2013.

## 2013-06-01 NOTE — Patient Instructions (Signed)
Pepcid 40mg  at bedtime Stay on nexium in Daytime before meal once Start Symbicort two puff twice daily Prednisone 10mg  Take 4 for three days 3 for three days 2 for three days 1 for three days and stop STOP spiriva Follow reflux diet Return 3 months

## 2013-06-03 NOTE — Assessment & Plan Note (Addendum)
Chronic obstructive lung disease with asthmatic bronchitic component exacerbated by obesity and recurrent tracheobronchitis. The patient is an ex-smoker. Also reflux disease is playing a role. PFTs 11/12:  FeV1 100%  Fef 25-75  51% with 42% improvement after BD,  TLC 111%  DLCO 136% with VA correction  Now with recurrent exacerbation Plan Inadequate control on spiriva alone Pepcid 40mg  at bedtime Stay on nexium in Daytime before meal once Start Symbicort two puff twice daily Prednisone 10mg  Take 4 for three days 3 for three days 2 for three days 1 for three days and stop STOP spiriva Follow reflux diet Return 3 months

## 2013-06-28 ENCOUNTER — Emergency Department (HOSPITAL_COMMUNITY): Payer: Managed Care, Other (non HMO)

## 2013-06-28 ENCOUNTER — Encounter (HOSPITAL_COMMUNITY): Payer: Self-pay | Admitting: Emergency Medicine

## 2013-06-28 ENCOUNTER — Emergency Department (HOSPITAL_COMMUNITY)
Admission: EM | Admit: 2013-06-28 | Discharge: 2013-06-28 | Disposition: A | Discharge status: Home / Self Care | Payer: Managed Care, Other (non HMO) | Attending: Emergency Medicine | Admitting: Emergency Medicine

## 2013-06-28 DIAGNOSIS — Z8669 Personal history of other diseases of the nervous system and sense organs: Secondary | ICD-10-CM | POA: Insufficient documentation

## 2013-06-28 DIAGNOSIS — K219 Gastro-esophageal reflux disease without esophagitis: Secondary | ICD-10-CM | POA: Insufficient documentation

## 2013-06-28 DIAGNOSIS — E119 Type 2 diabetes mellitus without complications: Secondary | ICD-10-CM | POA: Insufficient documentation

## 2013-06-28 DIAGNOSIS — D509 Iron deficiency anemia, unspecified: Secondary | ICD-10-CM

## 2013-06-28 DIAGNOSIS — Z87891 Personal history of nicotine dependence: Secondary | ICD-10-CM | POA: Insufficient documentation

## 2013-06-28 DIAGNOSIS — Z8739 Personal history of other diseases of the musculoskeletal system and connective tissue: Secondary | ICD-10-CM | POA: Insufficient documentation

## 2013-06-28 DIAGNOSIS — M109 Gout, unspecified: Secondary | ICD-10-CM | POA: Insufficient documentation

## 2013-06-28 DIAGNOSIS — IMO0002 Reserved for concepts with insufficient information to code with codable children: Secondary | ICD-10-CM | POA: Insufficient documentation

## 2013-06-28 DIAGNOSIS — Z79899 Other long term (current) drug therapy: Secondary | ICD-10-CM | POA: Insufficient documentation

## 2013-06-28 DIAGNOSIS — Z7982 Long term (current) use of aspirin: Secondary | ICD-10-CM | POA: Insufficient documentation

## 2013-06-28 DIAGNOSIS — J441 Chronic obstructive pulmonary disease with (acute) exacerbation: Secondary | ICD-10-CM

## 2013-06-28 DIAGNOSIS — Z794 Long term (current) use of insulin: Secondary | ICD-10-CM | POA: Insufficient documentation

## 2013-06-28 DIAGNOSIS — K625 Hemorrhage of anus and rectum: Secondary | ICD-10-CM | POA: Insufficient documentation

## 2013-06-28 DIAGNOSIS — I10 Essential (primary) hypertension: Secondary | ICD-10-CM | POA: Insufficient documentation

## 2013-06-28 DIAGNOSIS — E782 Mixed hyperlipidemia: Secondary | ICD-10-CM | POA: Insufficient documentation

## 2013-06-28 LAB — CBC WITH DIFFERENTIAL/PLATELET
Basophils Absolute: 0 10*3/uL (ref 0.0–0.1)
Eosinophils Absolute: 0.2 10*3/uL (ref 0.0–0.7)
Eosinophils Relative: 2 % (ref 0–5)
HCT: 29.3 % — ABNORMAL LOW (ref 36.0–46.0)
Hemoglobin: 9 g/dL — ABNORMAL LOW (ref 12.0–15.0)
Lymphs Abs: 2 10*3/uL (ref 0.7–4.0)
MCH: 22.6 pg — ABNORMAL LOW (ref 26.0–34.0)
MCV: 73.4 fL — ABNORMAL LOW (ref 78.0–100.0)
Monocytes Relative: 10 % (ref 3–12)
Neutro Abs: 5.7 10*3/uL (ref 1.7–7.7)
RBC: 3.99 MIL/uL (ref 3.87–5.11)

## 2013-06-28 LAB — BLOOD GAS, ARTERIAL
Acid-Base Excess: 2.4 mmol/L — ABNORMAL HIGH (ref 0.0–2.0)
Drawn by: 234301
O2 Content: 2 L/min
TCO2: 25.5 mmol/L (ref 0–100)
pCO2 arterial: 47 mmHg — ABNORMAL HIGH (ref 35.0–45.0)
pH, Arterial: 7.378 (ref 7.350–7.450)
pO2, Arterial: 109 mmHg — ABNORMAL HIGH (ref 80.0–100.0)

## 2013-06-28 LAB — BASIC METABOLIC PANEL
BUN: 17 mg/dL (ref 6–23)
Calcium: 8.5 mg/dL (ref 8.4–10.5)
GFR calc non Af Amer: 56 mL/min — ABNORMAL LOW (ref >90)
Glucose, Bld: 243 mg/dL — ABNORMAL HIGH (ref 70–99)
Potassium: 3.9 mEq/L (ref 3.5–5.1)
Sodium: 137 mEq/L (ref 135–145)

## 2013-06-28 LAB — PRO B NATRIURETIC PEPTIDE: Pro B Natriuretic peptide (BNP): 185.1 pg/mL — ABNORMAL HIGH (ref 0–125)

## 2013-06-28 LAB — OCCULT BLOOD, POC DEVICE: Fecal Occult Bld: POSITIVE — AB

## 2013-06-28 MED ORDER — ALBUTEROL SULFATE (5 MG/ML) 0.5% IN NEBU: 5.0000 mg | INHALATION_SOLUTION | Freq: Once | RESPIRATORY_TRACT | Status: DC

## 2013-06-28 MED ORDER — IPRATROPIUM BROMIDE 0.02 % IN SOLN
0.5000 mg | Freq: Once | RESPIRATORY_TRACT | Status: AC
  Administered 2013-06-28: 0.5 mg via RESPIRATORY_TRACT
  Filled 2013-06-28: qty 2.5

## 2013-06-28 MED ORDER — PREDNISONE 10 MG PO TABS: ORAL_TABLET | ORAL | Status: DC

## 2013-06-28 MED ORDER — PREDNISONE 50 MG PO TABS
60.0000 mg | ORAL_TABLET | Freq: Once | ORAL | Status: AC
  Administered 2013-06-28: 60 mg via ORAL
  Filled 2013-06-28 (×2): qty 1

## 2013-06-28 MED ORDER — ALBUTEROL (5 MG/ML) CONTINUOUS INHALATION SOLN
15.0000 mg/h | INHALATION_SOLUTION | RESPIRATORY_TRACT | Status: DC
  Administered 2013-06-28: 15 mg/h via RESPIRATORY_TRACT
  Filled 2013-06-28: qty 20

## 2013-06-28 NOTE — ED Notes (Addendum)
Sob, cough, wheeze for 2 days,  Yellow ,green sputum. No fever.   On home 02 2L as needed. Has used constantly for last 2 days

## 2013-06-28 NOTE — ED Provider Notes (Signed)
CSN: [id number]     Arrival date & time 06/28/13  1546 History   First MD Initiated Contact with Patient 06/28/13 1610     Chief Complaint  Patient presents with  . Shortness of Breath   (Consider location/radiation/quality/duration/timing/severity/associated sxs/prior Treatment) HPI Comments: [name] is a 67 y.o. female who presents for evaluation of shortness of breath. She's been ill for 2 days. She also has cough, productive of green sputum. She is using home nebulizers home oxygen, and inhalers, without relief. She is currently on prednisone, taper at 10 mg a day. The taper was begun about one month ago. She denies fever, nausea, vomiting, localized weakness, or paresthesias. She denies fever, or chills. There are no other known modifying factors.   The history is provided by the patient.    Past Medical History  Diagnosis Date  . COPD (chronic obstructive pulmonary disease)   . Degenerative disc disease   . Vitamin B12 deficiency   . Iron deficiency anemia   . Sleep apnea   . Essential hypertension, benign   . Mixed hyperlipidemia   . Type 2 diabetes mellitus   . History of hiatal hernia   . History of diverticulitis of colon   . GERD (gastroesophageal reflux disease)   . Gout   . Neuropathy    Past Surgical History  Procedure Laterality Date  . Esophagogastroduodenoscopy    . Colonoscopy  2008  . Cholecystectomy    . Partial hysterectomy  1978  . Tonsillectomy and adenoidectomy    . Umbilical hernia repair  2010  . Benign breast cysts    . Two back surgeries/fusion    . Abdominal hysterectomy    . Back surgery     Family History  Problem Relation Age of Onset  . Colon cancer Brother   . Ulcers Sister   . Ulcers Mother   . Diabetes Mother   . Heart attack Mother   . Stroke Mother   . Heart attack Brother   . Stroke Sister   . Diabetes Sister   . Heart attack Sister   . Kidney failure Sister   . Asthma Mother   . Asthma Sister   . Diabetes  Brother   . Stroke Maternal Grandmother    History  Substance Use Topics  . Smoking status: Former Smoker -- 1.00 packs/day for 1 years    Types: Cigarettes    Quit date: 08/05/1961  . Smokeless tobacco: Never Used  . Alcohol Use: No   OB History   Grav Para Term Preterm Abortions TAB SAB Ect Mult Living                 Review of Systems  All other systems reviewed and are negative.    Allergies  Review of patient's allergies indicates no known allergies.  Home Medications   Current Outpatient Rx  Name  Route  Sig  Dispense  Refill  . allopurinol (ZYLOPRIM) 100 MG tablet   Oral   Take 1 tablet by mouth 2 (two) times daily.         Marland Kitchen amitriptyline (ELAVIL) 100 MG tablet   Oral   Take 100 mg by mouth at bedtime.           Marland Kitchen aspirin 81 MG tablet   Oral   Take 81 mg by mouth daily.           Marland Kitchen atorvastatin (LIPITOR) 40 MG tablet   Oral   Take 1 tablet by  mouth at bedtime.          . budesonide-formoterol (SYMBICORT) 160-4.5 MCG/ACT inhaler   Inhalation   Inhale 2 puffs into the lungs 2 (two) times daily.   1 Inhaler   12   . cyclobenzaprine (FLEXERIL) 10 MG tablet   Oral   Take 10 mg by mouth 2 (two) times daily as needed for muscle spasms.         Marland Kitchen esomeprazole (NEXIUM) 40 MG capsule   Oral   Take 40 mg by mouth daily before breakfast.         . famotidine (PEPCID) 40 MG tablet   Oral   Take 1 tablet (40 mg total) by mouth at bedtime.   30 tablet   6   . furosemide (LASIX) 40 MG tablet   Oral   Take 40 mg by mouth daily.          Marland Kitchen HUMALOG KWIKPEN 100 UNIT/ML SOPN   Subcutaneous   Inject 14-18 Units into the skin 3 (three) times daily with meals. Based on SLIDING SCALE         . HYDROcodone-acetaminophen (NORCO/VICODIN) 5-325 MG per tablet   Oral   Take 1 tablet by mouth every 8 (eight) hours as needed for moderate pain or severe pain.          Marland Kitchen ipratropium-albuterol (DUONEB) 0.5-2.5 (3) MG/3ML SOLN   Inhalation   Inhale 3  mLs into the lungs 3 (three) times daily.          Marland Kitchen LANTUS SOLOSTAR 100 UNIT/ML SOPN   Subcutaneous   Inject 26 Units into the skin at bedtime.          . metFORMIN (GLUCOPHAGE) 1000 MG tablet   Oral   Take 1,000 mg by mouth 2 (two) times daily with a meal.         . metoprolol succinate (TOPROL-XL) 25 MG 24 hr tablet   Oral   Take 50 mg by mouth daily.          . naproxen sodium (ANAPROX) 220 MG tablet   Oral   Take 220-440 mg by mouth daily as needed (for pain).          . predniSONE (DELTASONE) 10 MG tablet   Oral   Take 10 mg by mouth daily. 3 day course starting on 06/27/2013.         . predniSONE (DELTASONE) 10 MG tablet      Take q., day- 6,5,4,3,2,1   21 tablet   0    BP 116/46  Pulse 104  Temp(Src) 98.6 F (37 C) (Oral)  Resp 20  Ht 4\' 11"  (1.499 m)  Wt 220 lb (99.791 kg)  BMI 44.41 kg/m2  SpO2 95% Physical Exam  Nursing note and vitals reviewed. Constitutional: She is oriented to person, place, and time. She appears well-developed.  Elderly, frail, appears older than stated age  HENT:  Head: Normocephalic and atraumatic.  Eyes: Conjunctivae and EOM are normal. Pupils are equal, round, and reactive to light.  Neck: Normal range of motion and phonation normal. Neck supple.  Cardiovascular: Normal rate, regular rhythm and intact distal pulses.   Pulmonary/Chest: Effort normal. No respiratory distress. She has wheezes (end expiratory). She has no rales. She exhibits no tenderness.  Audible external wheezing.  Abdominal: Soft. She exhibits no distension. There is no tenderness. There is no guarding.  Genitourinary:  Normal anus. Heme + brown stool in rectum.  Musculoskeletal: Normal range of motion.  She exhibits no edema and no tenderness.  Neurological: She is alert and oriented to person, place, and time. She exhibits normal muscle tone.  Skin: Skin is warm and dry.  Psychiatric: She has a normal mood and affect. Her behavior is normal.  Judgment and thought content normal.    ED Course  Procedures (including critical care time)  Medications  albuterol (PROVENTIL,VENTOLIN) solution continuous neb (15 mg/hr Nebulization New Bag/Given 06/28/13 1638)  ipratropium (ATROVENT) nebulizer solution 0.5 mg (0.5 mg Nebulization Given 06/28/13 1638)  predniSONE (DELTASONE) tablet 60 mg (60 mg Oral Given 06/28/13 1848)    Patient Vitals for the past 24 hrs:  BP Temp Temp src Pulse Resp SpO2 Height Weight  06/28/13 2000 116/46 mmHg - - 104 20 95 % - -  06/28/13 1934 117/46 mmHg 98.6 F (37 C) Oral 106 22 97 % - -  06/28/13 1930 137/69 mmHg - - 109 16 99 % - -  06/28/13 1900 130/51 mmHg - - 103 20 97 % - -  06/28/13 1834 133/53 mmHg - - 105 18 96 % - -  06/28/13 1807 145/61 mmHg - - 104 - 97 % - -  06/28/13 1639 - - - - - 94 % - -  06/28/13 1550 156/78 mmHg 98.4 F (36.9 C) Oral 106 24 97 % 4\' 11"  (1.499 m) 220 lb (99.791 kg)     6:33 PM Reevaluation with update and discussion. After initial assessment and treatment, an updated evaluation reveals she is feeling better, still on continuous nebulizer. Brenlyn Beshara L   8:19 PM Reevaluation with update and discussion. After initial assessment and treatment, an updated evaluation reveals she feels better, feels that her baseline, and would like to go home, now. We discussed the implications of lack positive stool. She will followup with her PCP, about it. Roslyn Review Labs Reviewed  CBC WITH DIFFERENTIAL - Abnormal; Notable for the following:    Hemoglobin 9.0 (*)    HCT 29.3 (*)    MCV 73.4 (*)    MCH 22.6 (*)    RDW 15.7 (*)    All other components within normal limits  BASIC METABOLIC PANEL - Abnormal; Notable for the following:    Glucose, Bld 243 (*)    GFR calc non Af Amer 56 (*)    GFR calc Af Amer 65 (*)    All other components within normal limits  PRO B NATRIURETIC PEPTIDE - Abnormal; Notable for the following:    Pro B Natriuretic peptide  (BNP) 185.1 (*)    All other components within normal limits  BLOOD GAS, ARTERIAL - Abnormal; Notable for the following:    pCO2 arterial 47.0 (*)    pO2, Arterial 109.0 (*)    Bicarbonate 27.1 (*)    Acid-Base Excess 2.4 (*)    All other components within normal limits  OCCULT BLOOD, POC DEVICE - Abnormal; Notable for the following:    Fecal Occult Bld POSITIVE (*)    All other components within normal limits  TROPONIN I   Imaging Review Dg Chest Portable 1 View  06/28/2013   CLINICAL DATA:  Cough  EXAM: PORTABLE CHEST - 1 VIEW  COMPARISON:  05/03/2013  FINDINGS: A hiatal hernia is again identified. The cardiac shadow is stable. The lungs are well aerated bilaterally without focal infiltrate or sizable effusion.  IMPRESSION: No acute abnormality noted.   Electronically Signed   By: Inez Catalina M.D.   On: 06/28/2013  16:50    EKG Interpretation   None       MDM   1. COPD exacerbation   2. Rectal bleeding   3. Anemia, iron deficiency     Nursing Notes Reviewed/ Care Coordinated, and agree without changes. Applicable Imaging Reviewed.  Interpretation of Laboratory Data incorporated into ED treatment   Plan: Home Medications- prednisone, taper, use oral iron daily-verbal advised; Home Treatments and Observation- rest, fluids; return here if the recommended treatment, does not improve the symptoms; Recommended follow up- PCP followup for checkup in 5-7 days.        Richarda Blade, MD 06/28/13 2153

## 2013-06-28 NOTE — ED Notes (Signed)
Assisted MD with stool hemoccult - POS.

## 2013-06-28 NOTE — ED Notes (Signed)
Pt states she is feeling better, denies sob, watching TV with family at present

## 2013-06-28 NOTE — ED Notes (Signed)
Patient given discharge instruction, verbalized understand. IV removed, band aid applied. Patient ambulatory out of the department.  

## 2013-06-30 ENCOUNTER — Inpatient Hospital Stay (HOSPITAL_COMMUNITY)
Admission: EM | Admit: 2013-06-30 | Discharge: 2013-07-07 | DRG: 191 | Disposition: A | Discharge status: Home / Self Care | Payer: Managed Care, Other (non HMO) | Attending: Internal Medicine | Admitting: Internal Medicine

## 2013-06-30 ENCOUNTER — Encounter (HOSPITAL_COMMUNITY): Payer: Self-pay | Admitting: Emergency Medicine

## 2013-06-30 ENCOUNTER — Emergency Department (HOSPITAL_COMMUNITY): Payer: Managed Care, Other (non HMO)

## 2013-06-30 DIAGNOSIS — Z87891 Personal history of nicotine dependence: Secondary | ICD-10-CM

## 2013-06-30 DIAGNOSIS — Z7982 Long term (current) use of aspirin: Secondary | ICD-10-CM

## 2013-06-30 DIAGNOSIS — D500 Iron deficiency anemia secondary to blood loss (chronic): Secondary | ICD-10-CM | POA: Diagnosis present

## 2013-06-30 DIAGNOSIS — Z833 Family history of diabetes mellitus: Secondary | ICD-10-CM

## 2013-06-30 DIAGNOSIS — Z79899 Other long term (current) drug therapy: Secondary | ICD-10-CM

## 2013-06-30 DIAGNOSIS — Z794 Long term (current) use of insulin: Secondary | ICD-10-CM

## 2013-06-30 DIAGNOSIS — M109 Gout, unspecified: Secondary | ICD-10-CM | POA: Diagnosis present

## 2013-06-30 DIAGNOSIS — E669 Obesity, unspecified: Secondary | ICD-10-CM | POA: Diagnosis present

## 2013-06-30 DIAGNOSIS — E1140 Type 2 diabetes mellitus with diabetic neuropathy, unspecified: Secondary | ICD-10-CM

## 2013-06-30 DIAGNOSIS — E1149 Type 2 diabetes mellitus with other diabetic neurological complication: Secondary | ICD-10-CM

## 2013-06-30 DIAGNOSIS — J441 Chronic obstructive pulmonary disease with (acute) exacerbation: Principal | ICD-10-CM | POA: Diagnosis present

## 2013-06-30 DIAGNOSIS — E871 Hypo-osmolality and hyponatremia: Secondary | ICD-10-CM | POA: Diagnosis not present

## 2013-06-30 DIAGNOSIS — E782 Mixed hyperlipidemia: Secondary | ICD-10-CM | POA: Diagnosis present

## 2013-06-30 DIAGNOSIS — R000 Tachycardia, unspecified: Secondary | ICD-10-CM | POA: Diagnosis present

## 2013-06-30 DIAGNOSIS — T380X5A Adverse effect of glucocorticoids and synthetic analogues, initial encounter: Secondary | ICD-10-CM | POA: Diagnosis present

## 2013-06-30 DIAGNOSIS — Z825 Family history of asthma and other chronic lower respiratory diseases: Secondary | ICD-10-CM

## 2013-06-30 DIAGNOSIS — G4733 Obstructive sleep apnea (adult) (pediatric): Secondary | ICD-10-CM | POA: Diagnosis present

## 2013-06-30 DIAGNOSIS — Z823 Family history of stroke: Secondary | ICD-10-CM

## 2013-06-30 DIAGNOSIS — G4730 Sleep apnea, unspecified: Secondary | ICD-10-CM

## 2013-06-30 DIAGNOSIS — K2100 Gastro-esophageal reflux disease with esophagitis, without bleeding: Secondary | ICD-10-CM | POA: Diagnosis present

## 2013-06-30 DIAGNOSIS — N179 Acute kidney failure, unspecified: Secondary | ICD-10-CM | POA: Diagnosis present

## 2013-06-30 DIAGNOSIS — Z8719 Personal history of other diseases of the digestive system: Secondary | ICD-10-CM

## 2013-06-30 DIAGNOSIS — Z8249 Family history of ischemic heart disease and other diseases of the circulatory system: Secondary | ICD-10-CM

## 2013-06-30 DIAGNOSIS — Z800 Family history of malignant neoplasm of digestive organs: Secondary | ICD-10-CM

## 2013-06-30 DIAGNOSIS — Z87898 Personal history of other specified conditions: Secondary | ICD-10-CM

## 2013-06-30 DIAGNOSIS — K449 Diaphragmatic hernia without obstruction or gangrene: Secondary | ICD-10-CM | POA: Diagnosis present

## 2013-06-30 DIAGNOSIS — E1142 Type 2 diabetes mellitus with diabetic polyneuropathy: Secondary | ICD-10-CM | POA: Diagnosis present

## 2013-06-30 DIAGNOSIS — J449 Chronic obstructive pulmonary disease, unspecified: Secondary | ICD-10-CM | POA: Diagnosis present

## 2013-06-30 DIAGNOSIS — Z23 Encounter for immunization: Secondary | ICD-10-CM

## 2013-06-30 DIAGNOSIS — J4489 Other specified chronic obstructive pulmonary disease: Secondary | ICD-10-CM | POA: Diagnosis present

## 2013-06-30 DIAGNOSIS — E538 Deficiency of other specified B group vitamins: Secondary | ICD-10-CM

## 2013-06-30 DIAGNOSIS — I10 Essential (primary) hypertension: Secondary | ICD-10-CM | POA: Diagnosis present

## 2013-06-30 DIAGNOSIS — K219 Gastro-esophageal reflux disease without esophagitis: Secondary | ICD-10-CM | POA: Diagnosis present

## 2013-06-30 DIAGNOSIS — IMO0002 Reserved for concepts with insufficient information to code with codable children: Secondary | ICD-10-CM

## 2013-06-30 DIAGNOSIS — E539 Vitamin B deficiency, unspecified: Secondary | ICD-10-CM | POA: Diagnosis present

## 2013-06-30 DIAGNOSIS — Z9981 Dependence on supplemental oxygen: Secondary | ICD-10-CM

## 2013-06-30 DIAGNOSIS — D509 Iron deficiency anemia, unspecified: Secondary | ICD-10-CM

## 2013-06-30 DIAGNOSIS — Z6841 Body Mass Index (BMI) 40.0 and over, adult: Secondary | ICD-10-CM

## 2013-06-30 DIAGNOSIS — R0902 Hypoxemia: Secondary | ICD-10-CM | POA: Diagnosis present

## 2013-06-30 LAB — BLOOD GAS, ARTERIAL
O2 Content: 3 L/min
O2 Saturation: 96.5 %
Patient temperature: 37
pH, Arterial: 7.368 (ref 7.350–7.450)
pO2, Arterial: 89 mmHg (ref 80.0–100.0)

## 2013-06-30 LAB — CBC WITH DIFFERENTIAL/PLATELET
Basophils Absolute: 0 10*3/uL (ref 0.0–0.1)
Basophils Relative: 0 % (ref 0–1)
HCT: 30.6 % — ABNORMAL LOW (ref 36.0–46.0)
Lymphocytes Relative: 9 % — ABNORMAL LOW (ref 12–46)
Lymphs Abs: 1.1 10*3/uL (ref 0.7–4.0)
Monocytes Absolute: 0.6 10*3/uL (ref 0.1–1.0)
Neutro Abs: 9.7 10*3/uL — ABNORMAL HIGH (ref 1.7–7.7)
Neutrophils Relative %: 85 % — ABNORMAL HIGH (ref 43–77)
Platelets: 386 10*3/uL (ref 150–400)
RBC: 4.2 MIL/uL (ref 3.87–5.11)
RDW: 15.9 % — ABNORMAL HIGH (ref 11.5–15.5)
WBC: 11.4 10*3/uL — ABNORMAL HIGH (ref 4.0–10.5)

## 2013-06-30 LAB — GLUCOSE, CAPILLARY
Glucose-Capillary: 331 mg/dL — ABNORMAL HIGH (ref 70–99)
Glucose-Capillary: 300 mg/dL — ABNORMAL HIGH (ref 70–99)

## 2013-06-30 LAB — BASIC METABOLIC PANEL
CO2: 27 mEq/L (ref 19–32)
Chloride: 98 mEq/L (ref 96–112)
GFR calc Af Amer: 55 mL/min — ABNORMAL LOW (ref >90)
Potassium: 4.7 mEq/L (ref 3.5–5.1)
Sodium: 135 mEq/L (ref 135–145)

## 2013-06-30 LAB — PRO B NATRIURETIC PEPTIDE: Pro B Natriuretic peptide (BNP): 359.6 pg/mL — ABNORMAL HIGH (ref 0–125)

## 2013-06-30 LAB — TROPONIN I: Troponin I: <0.3 ng/mL (ref <0.30)

## 2013-06-30 MED ORDER — ALBUTEROL SULFATE (5 MG/ML) 0.5% IN NEBU: 2.5000 mg | INHALATION_SOLUTION | RESPIRATORY_TRACT | Status: DC | PRN

## 2013-06-30 MED ORDER — PANTOPRAZOLE SODIUM 40 MG PO TBEC
40.0000 mg | DELAYED_RELEASE_TABLET | Freq: Every day | ORAL | Status: DC
  Administered 2013-07-01 – 2013-07-07 (×7): 40 mg via ORAL
  Filled 2013-06-30 (×7): qty 1

## 2013-06-30 MED ORDER — PNEUMOCOCCAL VAC POLYVALENT 25 MCG/0.5ML IJ INJ
0.5000 mL | INJECTION | Freq: Once | INTRAMUSCULAR | Status: AC
  Administered 2013-06-30: 0.5 mL via INTRAMUSCULAR
  Filled 2013-06-30: qty 0.5

## 2013-06-30 MED ORDER — IPRATROPIUM BROMIDE 0.02 % IN SOLN
RESPIRATORY_TRACT | Status: AC
  Administered 2013-06-30: 0.5 mg via RESPIRATORY_TRACT
  Filled 2013-06-30: qty 5

## 2013-06-30 MED ORDER — HEPARIN SODIUM (PORCINE) 5000 UNIT/ML IJ SOLN
5000.0000 [IU] | Freq: Three times a day (TID) | INTRAMUSCULAR | Status: DC
  Administered 2013-06-30 – 2013-07-07 (×21): 5000 [IU] via SUBCUTANEOUS
  Filled 2013-06-30 (×20): qty 1

## 2013-06-30 MED ORDER — ONDANSETRON HCL 4 MG PO TABS: 4.0000 mg | ORAL_TABLET | Freq: Four times a day (QID) | ORAL | Status: DC | PRN

## 2013-06-30 MED ORDER — ALBUTEROL SULFATE (5 MG/ML) 0.5% IN NEBU
INHALATION_SOLUTION | RESPIRATORY_TRACT | Status: AC
  Administered 2013-06-30: 10 mg/h via RESPIRATORY_TRACT
  Filled 2013-06-30: qty 2

## 2013-06-30 MED ORDER — METHYLPREDNISOLONE SODIUM SUCC 125 MG IJ SOLR
125.0000 mg | Freq: Once | INTRAMUSCULAR | Status: AC
  Administered 2013-06-30: 125 mg via INTRAVENOUS
  Filled 2013-06-30: qty 2

## 2013-06-30 MED ORDER — SODIUM CHLORIDE 0.9 % IJ SOLN
3.0000 mL | Freq: Two times a day (BID) | INTRAMUSCULAR | Status: DC
  Administered 2013-06-30 – 2013-07-06 (×14): 3 mL via INTRAVENOUS

## 2013-06-30 MED ORDER — METFORMIN HCL 500 MG PO TABS
1000.0000 mg | ORAL_TABLET | Freq: Two times a day (BID) | ORAL | Status: DC
  Administered 2013-06-30 – 2013-07-05 (×10): 1000 mg via ORAL
  Filled 2013-06-30 (×10): qty 2

## 2013-06-30 MED ORDER — MORPHINE SULFATE 2 MG/ML IJ SOLN: 2.0000 mg | INTRAMUSCULAR | Status: DC | PRN

## 2013-06-30 MED ORDER — METHYLPREDNISOLONE SODIUM SUCC 125 MG IJ SOLR
60.0000 mg | Freq: Four times a day (QID) | INTRAMUSCULAR | Status: DC
  Administered 2013-06-30 – 2013-07-01 (×4): 60 mg via INTRAVENOUS
  Filled 2013-06-30 (×4): qty 2

## 2013-06-30 MED ORDER — NAPROXEN SODIUM 220 MG PO TABS: 220.0000 mg | ORAL_TABLET | Freq: Every day | ORAL | Status: DC | PRN

## 2013-06-30 MED ORDER — IPRATROPIUM BROMIDE 0.02 % IN SOLN
0.5000 mg | RESPIRATORY_TRACT | Status: DC
  Administered 2013-06-30 – 2013-07-07 (×41): 0.5 mg via RESPIRATORY_TRACT
  Filled 2013-06-30 (×42): qty 2.5

## 2013-06-30 MED ORDER — ACETAMINOPHEN 325 MG PO TABS: 650.0000 mg | ORAL_TABLET | Freq: Four times a day (QID) | ORAL | Status: DC | PRN

## 2013-06-30 MED ORDER — FUROSEMIDE 40 MG PO TABS
40.0000 mg | ORAL_TABLET | Freq: Every day | ORAL | Status: DC
  Administered 2013-06-30 – 2013-07-05 (×6): 40 mg via ORAL
  Filled 2013-06-30 (×6): qty 1

## 2013-06-30 MED ORDER — ONDANSETRON HCL 4 MG/2ML IJ SOLN: 4.0000 mg | Freq: Four times a day (QID) | INTRAMUSCULAR | Status: DC | PRN

## 2013-06-30 MED ORDER — ACETAMINOPHEN 650 MG RE SUPP: 650.0000 mg | Freq: Four times a day (QID) | RECTAL | Status: DC | PRN

## 2013-06-30 MED ORDER — AMITRIPTYLINE HCL 25 MG PO TABS
100.0000 mg | ORAL_TABLET | Freq: Every day | ORAL | Status: DC
  Administered 2013-06-30 – 2013-07-06 (×7): 100 mg via ORAL
  Filled 2013-06-30 (×7): qty 4

## 2013-06-30 MED ORDER — METOPROLOL SUCCINATE ER 50 MG PO TB24
50.0000 mg | ORAL_TABLET | Freq: Every day | ORAL | Status: DC
  Administered 2013-06-30 – 2013-07-07 (×8): 50 mg via ORAL
  Filled 2013-06-30 (×8): qty 1

## 2013-06-30 MED ORDER — IPRATROPIUM BROMIDE 0.02 % IN SOLN
0.5000 mg | Freq: Once | RESPIRATORY_TRACT | Status: AC
  Administered 2013-06-30: 0.5 mg via RESPIRATORY_TRACT

## 2013-06-30 MED ORDER — ALLOPURINOL 100 MG PO TABS
100.0000 mg | ORAL_TABLET | Freq: Two times a day (BID) | ORAL | Status: DC
  Administered 2013-06-30 – 2013-07-07 (×14): 100 mg via ORAL
  Filled 2013-06-30 (×15): qty 1

## 2013-06-30 MED ORDER — BIOTENE DRY MOUTH MT LIQD
15.0000 mL | Freq: Two times a day (BID) | OROMUCOSAL | Status: DC
  Administered 2013-06-30 – 2013-07-07 (×14): 15 mL via OROMUCOSAL

## 2013-06-30 MED ORDER — INSULIN ASPART 100 UNIT/ML ~~LOC~~ SOLN
0.0000 [IU] | Freq: Every day | SUBCUTANEOUS | Status: DC
  Administered 2013-06-30: 3 [IU] via SUBCUTANEOUS
  Administered 2013-07-01 – 2013-07-02 (×2): 4 [IU] via SUBCUTANEOUS
  Administered 2013-07-03: 2 [IU] via SUBCUTANEOUS
  Administered 2013-07-06: 3 [IU] via SUBCUTANEOUS

## 2013-06-30 MED ORDER — IPRATROPIUM BROMIDE 0.02 % IN SOLN: 0.5000 mg | RESPIRATORY_TRACT | Status: DC | PRN

## 2013-06-30 MED ORDER — NAPROXEN 250 MG PO TABS: 250.0000 mg | ORAL_TABLET | Freq: Every day | ORAL | Status: DC | PRN

## 2013-06-30 MED ORDER — INSULIN ASPART 100 UNIT/ML ~~LOC~~ SOLN
0.0000 [IU] | Freq: Three times a day (TID) | SUBCUTANEOUS | Status: DC
  Administered 2013-06-30 – 2013-07-01 (×2): 15 [IU] via SUBCUTANEOUS
  Administered 2013-07-01: 11 [IU] via SUBCUTANEOUS
  Administered 2013-07-01: 15 [IU] via SUBCUTANEOUS
  Administered 2013-07-02: 11 [IU] via SUBCUTANEOUS
  Administered 2013-07-02: 20 [IU] via SUBCUTANEOUS
  Administered 2013-07-02 – 2013-07-03 (×2): 4 [IU] via SUBCUTANEOUS
  Administered 2013-07-03: 11 [IU] via SUBCUTANEOUS
  Administered 2013-07-03: 7 [IU] via SUBCUTANEOUS
  Administered 2013-07-04: 11 [IU] via SUBCUTANEOUS
  Administered 2013-07-04: 4 [IU] via SUBCUTANEOUS
  Administered 2013-07-04: 11 [IU] via SUBCUTANEOUS
  Administered 2013-07-05: 7 [IU] via SUBCUTANEOUS
  Administered 2013-07-05: 3 [IU] via SUBCUTANEOUS
  Administered 2013-07-05: 15 [IU] via SUBCUTANEOUS
  Administered 2013-07-06: 7 [IU] via SUBCUTANEOUS
  Administered 2013-07-06: 11 [IU] via SUBCUTANEOUS
  Administered 2013-07-06: 7 [IU] via SUBCUTANEOUS
  Administered 2013-07-07: 11 [IU] via SUBCUTANEOUS
  Administered 2013-07-07: 7 [IU] via SUBCUTANEOUS

## 2013-06-30 MED ORDER — HYDROCODONE-ACETAMINOPHEN 5-325 MG PO TABS: 1.0000 | ORAL_TABLET | Freq: Three times a day (TID) | ORAL | Status: DC | PRN

## 2013-06-30 MED ORDER — INSULIN GLARGINE 100 UNIT/ML ~~LOC~~ SOLN
26.0000 [IU] | Freq: Every day | SUBCUTANEOUS | Status: DC
  Administered 2013-06-30: 26 [IU] via SUBCUTANEOUS
  Filled 2013-06-30 (×2): qty 0.26

## 2013-06-30 MED ORDER — ATORVASTATIN CALCIUM 40 MG PO TABS
40.0000 mg | ORAL_TABLET | Freq: Every day | ORAL | Status: DC
  Administered 2013-06-30 – 2013-07-06 (×7): 40 mg via ORAL
  Filled 2013-06-30 (×7): qty 1

## 2013-06-30 MED ORDER — LEVOFLOXACIN IN D5W 750 MG/150ML IV SOLN
750.0000 mg | Freq: Once | INTRAVENOUS | Status: AC
  Administered 2013-06-30: 750 mg via INTRAVENOUS
  Filled 2013-06-30: qty 150

## 2013-06-30 MED ORDER — ASPIRIN EC 81 MG PO TBEC
81.0000 mg | DELAYED_RELEASE_TABLET | Freq: Every day | ORAL | Status: DC
  Administered 2013-06-30 – 2013-07-07 (×8): 81 mg via ORAL
  Filled 2013-06-30 (×8): qty 1

## 2013-06-30 MED ORDER — CYCLOBENZAPRINE HCL 10 MG PO TABS: 10.0000 mg | ORAL_TABLET | Freq: Two times a day (BID) | ORAL | Status: DC | PRN

## 2013-06-30 MED ORDER — ALBUTEROL (5 MG/ML) CONTINUOUS INHALATION SOLN
10.0000 mg/h | INHALATION_SOLUTION | Freq: Once | RESPIRATORY_TRACT | Status: AC
  Administered 2013-06-30: 10 mg/h via RESPIRATORY_TRACT

## 2013-06-30 MED ORDER — ALBUTEROL SULFATE (5 MG/ML) 0.5% IN NEBU
2.5000 mg | INHALATION_SOLUTION | RESPIRATORY_TRACT | Status: DC
  Administered 2013-06-30 – 2013-07-07 (×41): 2.5 mg via RESPIRATORY_TRACT
  Filled 2013-06-30 (×43): qty 0.5

## 2013-06-30 NOTE — ED Provider Notes (Signed)
CSN: [id number]     Arrival date & time 06/30/13  [id number] History  This chart was scribed for Orpah Greek, MD by Ivar Drape, ED Scribe. This patient was seen in room APA18/APA18 and the patient's care was started 9:40 AM.    Chief Complaint  Patient presents with  . Cough  . Shortness of Breath   HPI HPI Comments: [name] is a 67 y.o. Female with h/o COPD who presents to the Emergency Department complaining of gradual onset, gradually worsening SOB that began 06/26/13. Pt was seen here on 06/28/13 for the same. She was treated here for symptoms and started to improve. Pt reports associated gradually worsening cough that began 06/25/13 that is productive with green sputum. Pt states breathing was worse this morning than previously. She had trouble sleeping last night. Pt states she recently began Prednisone with no relief. She has used nebulizer at home with no relief.   She denies fever, chest pain. Pt uses oxygen at home nightly at baseline. She has been using it more since symptoms began.    Past Medical History  Diagnosis Date  . COPD (chronic obstructive pulmonary disease)   . Degenerative disc disease   . Vitamin B12 deficiency   . Iron deficiency anemia   . Sleep apnea   . Essential hypertension, benign   . Mixed hyperlipidemia   . Type 2 diabetes mellitus   . History of hiatal hernia   . History of diverticulitis of colon   . GERD (gastroesophageal reflux disease)   . Gout   . Neuropathy    Past Surgical History  Procedure Laterality Date  . Esophagogastroduodenoscopy    . Colonoscopy  2008  . Cholecystectomy    . Partial hysterectomy  1978  . Tonsillectomy and adenoidectomy    . Umbilical hernia repair  2010  . Benign breast cysts    . Two back surgeries/fusion    . Abdominal hysterectomy    . Back surgery     Family History  Problem Relation Age of Onset  . Colon cancer Brother   . Ulcers Sister   . Ulcers Mother   . Diabetes Mother   .  Heart attack Mother   . Stroke Mother   . Heart attack Brother   . Stroke Sister   . Diabetes Sister   . Heart attack Sister   . Kidney failure Sister   . Asthma Mother   . Asthma Sister   . Diabetes Brother   . Stroke Maternal Grandmother    History  Substance Use Topics  . Smoking status: Former Smoker -- 1.00 packs/day for 1 years    Types: Cigarettes    Quit date: 08/05/1961  . Smokeless tobacco: Never Used  . Alcohol Use: No   OB History   Grav Para Term Preterm Abortions TAB SAB Ect Mult Living                 Review of Systems  Constitutional: Negative for fever.  Respiratory: Positive for cough, shortness of breath and wheezing.   Cardiovascular: Negative for chest pain.  All other systems reviewed and are negative.    Allergies  Review of patient's allergies indicates no known allergies.  Home Medications   Current Outpatient Rx  Name  Route  Sig  Dispense  Refill  . allopurinol (ZYLOPRIM) 100 MG tablet   Oral   Take 1 tablet by mouth 2 (two) times daily.         Marland Kitchen  amitriptyline (ELAVIL) 100 MG tablet   Oral   Take 100 mg by mouth at bedtime.           Marland Kitchen aspirin 81 MG tablet   Oral   Take 81 mg by mouth daily.           Marland Kitchen atorvastatin (LIPITOR) 40 MG tablet   Oral   Take 1 tablet by mouth at bedtime.          . budesonide-formoterol (SYMBICORT) 160-4.5 MCG/ACT inhaler   Inhalation   Inhale 2 puffs into the lungs 2 (two) times daily.   1 Inhaler   12   . cyclobenzaprine (FLEXERIL) 10 MG tablet   Oral   Take 10 mg by mouth 2 (two) times daily as needed for muscle spasms.         Marland Kitchen esomeprazole (NEXIUM) 40 MG capsule   Oral   Take 40 mg by mouth daily before breakfast.         . famotidine (PEPCID) 40 MG tablet   Oral   Take 1 tablet (40 mg total) by mouth at bedtime.   30 tablet   6   . furosemide (LASIX) 40 MG tablet   Oral   Take 40 mg by mouth daily.          Marland Kitchen HUMALOG KWIKPEN 100 UNIT/ML SOPN   Subcutaneous    Inject 14-18 Units into the skin 3 (three) times daily with meals. Based on SLIDING SCALE         . HYDROcodone-acetaminophen (NORCO/VICODIN) 5-325 MG per tablet   Oral   Take 1 tablet by mouth every 8 (eight) hours as needed for moderate pain or severe pain.          Marland Kitchen ipratropium-albuterol (DUONEB) 0.5-2.5 (3) MG/3ML SOLN   Inhalation   Inhale 3 mLs into the lungs 3 (three) times daily.          Marland Kitchen LANTUS SOLOSTAR 100 UNIT/ML SOPN   Subcutaneous   Inject 26 Units into the skin at bedtime.          . metFORMIN (GLUCOPHAGE) 1000 MG tablet   Oral   Take 1,000 mg by mouth 2 (two) times daily with a meal.         . metoprolol succinate (TOPROL-XL) 25 MG 24 hr tablet   Oral   Take 50 mg by mouth daily.          . naproxen sodium (ANAPROX) 220 MG tablet   Oral   Take 220-440 mg by mouth daily as needed (for pain).          . predniSONE (DELTASONE) 10 MG tablet   Oral   Take 10 mg by mouth daily. 3 day course starting on 06/27/2013.         . predniSONE (DELTASONE) 10 MG tablet      Take q., day- 6,5,4,3,2,1   21 tablet   0    BP 175/85  Pulse 108  Resp 34  Ht 4\' 11"  (1.499 m)  Wt 220 lb (99.791 kg)  BMI 44.41 kg/m2  SpO2 89%  Physical Exam  Nursing note and vitals reviewed. Constitutional: She is oriented to person, place, and time. She appears well-developed and well-nourished. No distress.  HENT:  Head: Normocephalic and atraumatic.  Right Ear: Hearing normal.  Left Ear: Hearing normal.  Nose: Nose normal.  Mouth/Throat: Oropharynx is clear and moist and mucous membranes are normal.  Eyes: Conjunctivae and EOM are normal. Pupils are  equal, round, and reactive to light.  Neck: Normal range of motion. Neck supple.  Cardiovascular: Normal rate, regular rhythm, S1 normal, S2 normal and normal heart sounds.  Exam reveals no gallop and no friction rub.   No murmur heard. Pulmonary/Chest: No respiratory distress. She has decreased breath sounds. She has  wheezes. She exhibits no tenderness.  Diffuse wheezing. Decreased breathe sounds.   Abdominal: Soft. Normal appearance and bowel sounds are normal. There is no hepatosplenomegaly. There is no tenderness. There is no rebound, no guarding, no tenderness at McBurney's point and negative Murphy's sign. No hernia.  Musculoskeletal: Normal range of motion.  Neurological: She is alert and oriented to person, place, and time. She has normal strength. No cranial nerve deficit or sensory deficit. Coordination normal. GCS eye subscore is 4. GCS verbal subscore is 5. GCS motor subscore is 6.  Skin: Skin is warm, dry and intact. No rash noted. No cyanosis.  Psychiatric: She has a normal mood and affect. Her speech is normal and behavior is normal. Thought content normal.    ED Course  Procedures (including critical care time) DIAGNOSTIC STUDIES: Oxygen Saturation is 89% on [place], low by my interpretation.    COORDINATION OF CARE: 9:44 AM-Discussed treatment plan with pt at bedside and pt agreed to plan.   Labs Review Labs Reviewed  CBC WITH DIFFERENTIAL - Abnormal; Notable for the following:    WBC 11.4 (*)    Hemoglobin 9.7 (*)    HCT 30.6 (*)    MCV 72.9 (*)    MCH 23.1 (*)    RDW 15.9 (*)    Neutrophils Relative % 85 (*)    Neutro Abs 9.7 (*)    Lymphocytes Relative 9 (*)    All other components within normal limits  BASIC METABOLIC PANEL - Abnormal; Notable for the following:    Glucose, Bld 275 (*)    BUN 24 (*)    Creatinine, Ser 1.16 (*)    GFR calc non Af Amer 47 (*)    GFR calc Af Amer 55 (*)    All other components within normal limits  PRO B NATRIURETIC PEPTIDE - Abnormal; Notable for the following:    Pro B Natriuretic peptide (BNP) 359.6 (*)    All other components within normal limits  BLOOD GAS, ARTERIAL - Abnormal; Notable for the following:    pCO2 arterial 47.8 (*)    Bicarbonate 26.8 (*)    Acid-Base Excess 2.1 (*)    All other components within normal limits  TROPONIN  I   Imaging Review Dg Chest Portable 1 View  06/28/2013   CLINICAL DATA:  Cough  EXAM: PORTABLE CHEST - 1 VIEW  COMPARISON:  05/03/2013  FINDINGS: A hiatal hernia is again identified. The cardiac shadow is stable. The lungs are well aerated bilaterally without focal infiltrate or sizable effusion.  IMPRESSION: No acute abnormality noted.   Electronically Signed   By: Inez Catalina M.D.   On: 06/28/2013 16:50    EKG Interpretation    Date/Time:  Wednesday June 30 2013 09:50:03 EST Ventricular Rate:  102 PR Interval:  132 QRS Duration: 88 QT Interval:  330 QTC Calculation: 430 R Axis:   47 Text Interpretation:  Sinus tachycardia Otherwise normal ECG When compared with ECG of 08-Dec-2012 09:38, No significant change was found Confirmed by Lotus Santillo  MD, Muzammil Bruins ([id number]) on 06/30/2013 9:57:43 AM            MDM  Diagnosis: COPD  Patient presents to  the ER for evaluation of difficulty breathing. Patient has a history of COPD, currently on a very prolonged prednisone taper. Patient uses oxygen at night usually, but has been using it for several days continuously without relief. Her nebulizers are helping. Patient was seen in the ED 2 days ago for similar. She was treated with continuous albuterol nebulizer with some improvement. She was restarted on a higher dose of prednisone taper. Patient returns today with significant bronchospasm. Blood gas was done showing significant CO2 retention. Oxygenation is adequate, but on 3 L of nasal cannula supplemental oxygen. On room air patient was hypoxic on arrival. Because the patient has been on maximal bronchodilator and corticosteroid treatment as an outpatient has had 2 visits with significant bronchospasm, will require hospitalization for further management.  I personally performed the services described in this documentation, which was scribed in my presence. The recorded information has been reviewed and is accurate.     Orpah Greek, MD 06/30/13 1056

## 2013-06-30 NOTE — H&P (Signed)
[facility] History and Physical  [name] [id number] DOB: 01/04/1946 DOA: 06/30/2013  Referring physician: emergency department PCP: Tylene Fantasia, NP  Specialists:   Chief Complaint: SOB, wheezing  HPI: [name] is a 67 y.o. female  With a hx of copd who was recently seen in the ED for COPD exacerbation two days ago, discharged home with a prednisone taper. Pt's sx worsened despite home neb tx and she subsequently presented to the ED today with worsened sob and wheezing. In the ED, the patient was found to have an unremarkable cxr with a 3L O2 dependence. Given failure of outpatient treatment, the hospitalists were consulted for admission.  Review of Systems: Per above, the remainder of the 10pt ros reviewed and are neg  Past Medical History  Diagnosis Date  . COPD (chronic obstructive pulmonary disease)   . Degenerative disc disease   . Vitamin B12 deficiency   . Iron deficiency anemia   . Sleep apnea   . Essential hypertension, benign   . Mixed hyperlipidemia   . Type 2 diabetes mellitus   . History of hiatal hernia   . History of diverticulitis of colon   . GERD (gastroesophageal reflux disease)   . Gout   . Neuropathy    Past Surgical History  Procedure Laterality Date  . Esophagogastroduodenoscopy    . Colonoscopy  2008  . Cholecystectomy    . Partial hysterectomy  1978  . Tonsillectomy and adenoidectomy    . Umbilical hernia repair  2010  . Benign breast cysts    . Two back surgeries/fusion    . Abdominal hysterectomy    . Back surgery     Social History:  reports that she quit smoking about 51 years ago. Her smoking use included Cigarettes. She has a 1 pack-year smoking history. She has never used smokeless tobacco. She reports that she does not drink alcohol or use illicit drugs.  where does patient live--home, ALF, SNF? and with whom if at home?  Can patient participate in ADLs?  No Known Allergies  Family History   Problem Relation Age of Onset  . Colon cancer Brother   . Ulcers Sister   . Ulcers Mother   . Diabetes Mother   . Heart attack Mother   . Stroke Mother   . Heart attack Brother   . Stroke Sister   . Diabetes Sister   . Heart attack Sister   . Kidney failure Sister   . Asthma Mother   . Asthma Sister   . Diabetes Brother   . Stroke Maternal Grandmother     (be sure to complete)  Prior to Admission medications   Medication Sig Start Date End Date Taking? Authorizing Provider  allopurinol (ZYLOPRIM) 100 MG tablet Take 1 tablet by mouth 2 (two) times daily. 10/09/12  Yes Historical Provider, MD  amitriptyline (ELAVIL) 100 MG tablet Take 100 mg by mouth at bedtime.     Yes Historical Provider, MD  aspirin 81 MG tablet Take 81 mg by mouth daily.     Yes Historical Provider, MD  atorvastatin (LIPITOR) 40 MG tablet Take 1 tablet by mouth at bedtime.  10/09/12  Yes Historical Provider, MD  budesonide-formoterol (SYMBICORT) 160-4.5 MCG/ACT inhaler Inhale 2 puffs into the lungs 2 (two) times daily. 06/01/13  Yes Elsie Stain, MD  cyclobenzaprine (FLEXERIL) 10 MG tablet Take 10 mg by mouth 2 (two) times daily as needed for muscle spasms.   Yes Historical Provider, MD  esomeprazole (  NEXIUM) 40 MG capsule Take 40 mg by mouth daily before breakfast. 05/14/11  Yes Elsie Stain, MD  famotidine (PEPCID) 40 MG tablet Take 1 tablet (40 mg total) by mouth at bedtime. 06/01/13  Yes Elsie Stain, MD  furosemide (LASIX) 40 MG tablet Take 40 mg by mouth daily.    Yes Historical Provider, MD  HUMALOG KWIKPEN 100 UNIT/ML SOPN Inject 14-18 Units into the skin 3 (three) times daily with meals. Based on SLIDING SCALE 10/06/12  Yes Historical Provider, MD  HYDROcodone-acetaminophen (NORCO/VICODIN) 5-325 MG per tablet Take 1 tablet by mouth every 8 (eight) hours as needed for moderate pain or severe pain.  03/26/13  Yes Historical Provider, MD  ipratropium-albuterol (DUONEB) 0.5-2.5 (3) MG/3ML SOLN Inhale 3 mLs  into the lungs 3 (three) times daily.  02/19/13  Yes Historical Provider, MD  LANTUS SOLOSTAR 100 UNIT/ML SOPN Inject 26 Units into the skin at bedtime.  10/06/12  Yes Historical Provider, MD  metFORMIN (GLUCOPHAGE) 1000 MG tablet Take 1,000 mg by mouth 2 (two) times daily with a meal.   Yes Historical Provider, MD  metoprolol succinate (TOPROL-XL) 25 MG 24 hr tablet Take 50 mg by mouth daily.    Yes Historical Provider, MD  naproxen sodium (ANAPROX) 220 MG tablet Take 220-440 mg by mouth daily as needed (for pain).    Yes Historical Provider, MD  predniSONE (DELTASONE) 10 MG tablet Take 10 mg by mouth daily. 3 day course starting on 06/27/2013.   Yes Historical Provider, MD   Physical Exam: Filed Vitals:   06/30/13 0945 06/30/13 1011 06/30/13 1025 06/30/13 1200  BP:   156/70 137/57  Pulse:   104 116  Temp:   97.7 F (36.5 C)   TempSrc:   Oral   Resp:   16 17  Height:      Weight:      SpO2: 93% 95% 95%      General:  Awake, in nad  Eyes: PERRL B  ENT: membranes moist, dentition fair  Neck: trachea midline, neck supple  Cardiovascular: tachycardic, s1, s2  Respiratory: mildly increased resp effort, decreased breath sounds, wheezing throughout  Abdomen: soft, nondistended  Skin: normal skin turgor, no abnormal skin lesions seen  Musculoskeletal: perfused, no clubbing  Psychiatric: mood/affect normal // no auditory/visual hallucinations  Neurologic: cn2-12 grossly intact, strength/sensation intact  Labs on Admission:  Basic Metabolic Panel:  Recent Labs Lab 06/28/13 1615 06/30/13 0954  NA 137 135  K 3.9 4.7  CL 101 98  CO2 27 27  GLUCOSE 243* 275*  BUN 17 24*  CREATININE 1.01 1.16*  CALCIUM 8.5 8.8   Liver Function Tests: No results found for this basename: AST, ALT, ALKPHOS, BILITOT, PROT, ALBUMIN,  in the last 168 hours No results found for this basename: LIPASE, AMYLASE,  in the last 168 hours No results found for this basename: AMMONIA,  in the last 168  hours CBC:  Recent Labs Lab 06/28/13 1615 06/30/13 0954  WBC 8.7 11.4*  NEUTROABS 5.7 9.7*  HGB 9.0* 9.7*  HCT 29.3* 30.6*  MCV 73.4* 72.9*  PLT 337 386   Cardiac Enzymes:  Recent Labs Lab 06/28/13 1615 06/30/13 0954  TROPONINI <0.30 <0.30    BNP (last 3 results)  Recent Labs  06/28/13 1615 06/30/13 0954  PROBNP 185.1* 359.6*   CBG: No results found for this basename: GLUCAP,  in the last 168 hours  Radiological Exams on Admission: Dg Chest 2 View  06/30/2013   CLINICAL DATA:  Worsening shortness of breath, cough, congestion.  EXAM: CHEST  2 VIEW  COMPARISON:  06/28/2013  FINDINGS: Heart is upper limits normal in size. No confluent airspace opacities or effusions. Study is AP lordotic in positioning. No acute bony abnormality.  IMPRESSION: No active cardiopulmonary disease.   Electronically Signed   By: Rolm Baptise M.D.   On: 06/30/2013 12:18   Dg Chest Portable 1 View  06/28/2013   CLINICAL DATA:  Cough  EXAM: PORTABLE CHEST - 1 VIEW  COMPARISON:  05/03/2013  FINDINGS: A hiatal hernia is again identified. The cardiac shadow is stable. The lungs are well aerated bilaterally without focal infiltrate or sizable effusion.  IMPRESSION: No acute abnormality noted.   Electronically Signed   By: Inez Catalina M.D.   On: 06/28/2013 16:50   Assessment/Plan Principal Problem:   Obstructive chronic bronchitis with exacerbation Active Problems:   DM type 2 with diabetic peripheral neuropathy   VITAMIN B12 DEFICIENCY   Essential hypertension, benign   COPD (chronic obstructive pulmonary disease)   Tachycardia   1. COPD exacerbation 1. Failed outpatient tx 2. Will admit to med-tele, inpt 3. Cont on scheduled nebs and IV steroids 4. Cont O2 as needed and wean as tolerated 2. DM 1. Cont on SSI 3. HTN 1. BP stable 2. Cont meds 4. Tachycardia 1. Suspect secondary to neb tx 2. Monitor for now 5. DVT prophylaxis 1. Heparin subq  Code Status: Full (must indicate code  status--if unknown or must be presumed, indicate so) Family Communication: Pt in room (indicate person spoken with, if applicable, with phone number if by telephone) Disposition Plan: Pending (indicate anticipated LOS)  Time spent: 77min  Ofilia Rayon, [facility] Pager [phone]  If 7PM-7AM, please contact night-coverage [email or web address] Password [facility] 06/30/2013, 12:42 PM

## 2013-06-30 NOTE — ED Notes (Signed)
Pt c/o worsening productive cough/sob with green sputum. edp at bedside.

## 2013-07-01 LAB — COMPREHENSIVE METABOLIC PANEL
ALT: 38 U/L — ABNORMAL HIGH (ref 0–35)
Albumin: 3.2 g/dL — ABNORMAL LOW (ref 3.5–5.2)
BUN: 36 mg/dL — ABNORMAL HIGH (ref 6–23)
Calcium: 8.2 mg/dL — ABNORMAL LOW (ref 8.4–10.5)
Chloride: 95 mEq/L — ABNORMAL LOW (ref 96–112)
Creatinine, Ser: 1.17 mg/dL — ABNORMAL HIGH (ref 0.50–1.10)
Glucose, Bld: 403 mg/dL — ABNORMAL HIGH (ref 70–99)
Total Bilirubin: 0.5 mg/dL (ref 0.3–1.2)
Total Protein: 7 g/dL (ref 6.0–8.3)

## 2013-07-01 LAB — CBC
Hemoglobin: 8.6 g/dL — ABNORMAL LOW (ref 12.0–15.0)
MCHC: 30.9 g/dL (ref 30.0–36.0)
MCV: 72.8 fL — ABNORMAL LOW (ref 78.0–100.0)
Platelets: 316 10*3/uL (ref 150–400)
RBC: 3.82 MIL/uL — ABNORMAL LOW (ref 3.87–5.11)
WBC: 7.7 10*3/uL (ref 4.0–10.5)

## 2013-07-01 LAB — GLUCOSE, CAPILLARY
Glucose-Capillary: 333 mg/dL — ABNORMAL HIGH (ref 70–99)
Glucose-Capillary: 260 mg/dL — ABNORMAL HIGH (ref 70–99)

## 2013-07-01 MED ORDER — METHYLPREDNISOLONE SODIUM SUCC 125 MG IJ SOLR
60.0000 mg | Freq: Three times a day (TID) | INTRAMUSCULAR | Status: DC
  Administered 2013-07-01 – 2013-07-02 (×3): 60 mg via INTRAVENOUS
  Filled 2013-07-01 (×3): qty 2

## 2013-07-01 MED ORDER — INSULIN GLARGINE 100 UNIT/ML ~~LOC~~ SOLN
30.0000 [IU] | Freq: Every day | SUBCUTANEOUS | Status: DC
  Administered 2013-07-01 – 2013-07-04 (×4): 30 [IU] via SUBCUTANEOUS
  Filled 2013-07-01 (×4): qty 0.3

## 2013-07-01 NOTE — Plan of Care (Signed)
Problem: Phase I Progression Outcomes Goal: Flu/PneumoVaccines if indicated Outcome: Completed/Met Date Met:  07/01/13 Pt already had flu shot for the year, PNA vaccine was given on admission.

## 2013-07-01 NOTE — Progress Notes (Signed)
[facility] PROGRESS NOTE  [name] [id number] DOB: [birth year] DOA: 06/30/2013 PCP: Tylene Fantasia, NP  Assessment/Plan: 1. COPD exacerbation  1. Failed outpatient tx 2. Pt reports feeling better. Still requires O2 3. Cont on scheduled nebs and IV steroids, wean as tolerated 4. Cont O2 as needed and wean as tolerated 2. DM  1. Cont on SSI 2. BS poorly controlled, likely secondary to steroids 3. Will increase lantus from 26 units to 30 units 3. HTN  1. BP stable 2. Cont meds 4. Tachycardia  1. Suspect secondary to neb tx 2. Improved overnight 3. Monitor for now 5. DVT prophylaxis  1. Heparin subq  Code Status: Full Family Communication: Pt in room (indicate person spoken with, relationship, and if by phone, the number) Disposition Plan: Pending   HPI/Subjective: No acute events noted overnight  Objective: Filed Vitals:   06/30/13 2341 07/01/13 0355 07/01/13 0609 07/01/13 0741  BP:   124/67   Pulse:   93   Temp:   97.7 F (36.5 C)   TempSrc:   Oral   Resp:   20   Height:      Weight:      SpO2: 95% 96% 94% 96%    Intake/Output Summary (Last 24 hours) at 07/01/13 0926 Last data filed at 06/30/13 1800  Gross per 24 hour  Intake    240 ml  Output      0 ml  Net    240 ml   Filed Weights   06/30/13 0942 06/30/13 1503  Weight: 99.791 kg (220 lb) 99.791 kg (220 lb)    Exam:   General:  Awake, in nad  Cardiovascular: regular, s1, s2  Respiratory: normal resp effort, decreased BS throughout, wheezing  Abdomen: soft, nondistended  Musculoskeletal: perfused, no clubbing   Data Reviewed: Basic Metabolic Panel:  Recent Labs Lab 06/28/13 1615 06/30/13 0954 07/01/13 0618  NA 137 135 134*  K 3.9 4.7 5.0  CL 101 98 95*  CO2 27 27 28   GLUCOSE 243* 275* 403*  BUN 17 24* 36*  CREATININE 1.01 1.16* 1.17*  CALCIUM 8.5 8.8 8.2*   Liver Function Tests:  Recent Labs Lab 07/01/13 0618  AST 31  ALT 38*  ALKPHOS 96  BILITOT 0.5   PROT 7.0  ALBUMIN 3.2*   No results found for this basename: LIPASE, AMYLASE,  in the last 168 hours No results found for this basename: AMMONIA,  in the last 168 hours CBC:  Recent Labs Lab 06/28/13 1615 06/30/13 0954 07/01/13 0618  WBC 8.7 11.4* 7.7  NEUTROABS 5.7 9.7*  --   HGB 9.0* 9.7* 8.6*  HCT 29.3* 30.6* 27.8*  MCV 73.4* 72.9* 72.8*  PLT 337 386 316   Cardiac Enzymes:  Recent Labs Lab 06/28/13 1615 06/30/13 0954  TROPONINI <0.30 <0.30   BNP (last 3 results)  Recent Labs  06/28/13 1615 06/30/13 0954  PROBNP 185.1* 359.6*   CBG:  Recent Labs Lab 06/30/13 1613 06/30/13 2047 07/01/13 0718  GLUCAP 331* 300* 348*    No results found for this or any previous visit (from the past 240 hour(s)).   Studies: Dg Chest 2 View  06/30/2013   CLINICAL DATA:  Worsening shortness of breath, cough, congestion.  EXAM: CHEST  2 VIEW  COMPARISON:  06/28/2013  FINDINGS: Heart is upper limits normal in size. No confluent airspace opacities or effusions. Study is AP lordotic in positioning. No acute bony abnormality.  IMPRESSION: No active cardiopulmonary disease.   Electronically  Signed   By: Rolm Baptise M.D.   On: 06/30/2013 12:18    Scheduled Meds: . ipratropium  0.5 mg Nebulization Q4H   And  . albuterol  2.5 mg Nebulization Q4H  . allopurinol  100 mg Oral BID  . amitriptyline  100 mg Oral QHS  . antiseptic oral rinse  15 mL Mouth Rinse BID  . aspirin EC  81 mg Oral Daily  . atorvastatin  40 mg Oral QHS  . furosemide  40 mg Oral Daily  . heparin  5,000 Units Subcutaneous Q8H  . insulin aspart  0-20 Units Subcutaneous TID WC  . insulin aspart  0-5 Units Subcutaneous QHS  . insulin glargine  26 Units Subcutaneous QHS  . metFORMIN  1,000 mg Oral BID WC  . methylPREDNISolone (SOLU-MEDROL) injection  60 mg Intravenous Q6H  . metoprolol succinate  50 mg Oral Daily  . pantoprazole  40 mg Oral Daily  . sodium chloride  3 mL Intravenous Q12H   Continuous  Infusions:   Principal Problem:   Obstructive chronic bronchitis with exacerbation Active Problems:   DM type 2 with diabetic peripheral neuropathy   VITAMIN B12 DEFICIENCY   Essential hypertension, benign   COPD (chronic obstructive pulmonary disease)   Tachycardia   COPD exacerbation  Time spent: 45min  CHIU, [place] [facility] Pager [phone]. If 7PM-7AM, please contact night-coverage at [email or web address], password [facility] 07/01/2013, 9:26 AM  LOS: 1 day

## 2013-07-02 DIAGNOSIS — IMO0002 Reserved for concepts with insufficient information to code with codable children: Secondary | ICD-10-CM

## 2013-07-02 LAB — GLUCOSE, CAPILLARY
Glucose-Capillary: 355 mg/dL — ABNORMAL HIGH (ref 70–99)
Glucose-Capillary: 192 mg/dL — ABNORMAL HIGH (ref 70–99)
Glucose-Capillary: 308 mg/dL — ABNORMAL HIGH (ref 70–99)

## 2013-07-02 MED ORDER — METHYLPREDNISOLONE SODIUM SUCC 125 MG IJ SOLR
60.0000 mg | Freq: Two times a day (BID) | INTRAMUSCULAR | Status: DC
  Administered 2013-07-02 – 2013-07-04 (×4): 60 mg via INTRAVENOUS
  Filled 2013-07-02 (×4): qty 2

## 2013-07-02 NOTE — Progress Notes (Signed)
Utilization Review Complete  

## 2013-07-02 NOTE — Progress Notes (Signed)
[facility] PROGRESS NOTE  [name] [id number] DOB: 08/07/1945 DOA: 06/30/2013 PCP: Rita Fantasia, NP  Assessment/Plan: 1. COPD exacerbation  1. Failed outpatient tx 2. Pt reports feeling better. Still requires O2 3. Cont on scheduled nebs and IV steroids, wean as tolerated 4. Cont O2 as needed and wean as tolerated 2. DM  1. Cont on SSI 2. BS poorly controlled, likely secondary to steroids 3. Will increase lantus from 26 units to 30 units 3. HTN  1. BP stable 2. Cont meds 4. Tachycardia  1. Suspect secondary to neb tx 2. Improved overnight 3. Monitor for now 5. DVT prophylaxis  1. Heparin subq  Code Status: Full Family Communication: Pt in room (indicate person spoken with, relationship, and if by phone, the number) Disposition Plan: Pending   HPI/Subjective: No acute events noted overnight  Objective: Filed Vitals:   07/02/13 0004 07/02/13 0400 07/02/13 0452 07/02/13 0705  BP:  125/56    Pulse:  93    Temp:  98 F (36.7 C)    TempSrc:  Oral    Resp:      Height:      Weight:      SpO2: 95% 91% 96% 96%   No intake or output data in the 24 hours ending 07/02/13 1219 Filed Weights   06/30/13 0942 06/30/13 1503  Weight: 99.791 kg (220 lb) 99.791 kg (220 lb)    Exam:   General:  Awake, in nad  Cardiovascular: regular, s1, s2  Respiratory: normal resp effort, decreased BS throughout, wheezing  Abdomen: soft, nondistended  Musculoskeletal: perfused, no clubbing   Data Reviewed: Basic Metabolic Panel:  Recent Labs Lab 06/28/13 1615 06/30/13 0954 07/01/13 0618  NA 137 135 134*  K 3.9 4.7 5.0  CL 101 98 95*  CO2 27 27 28   GLUCOSE 243* 275* 403*  BUN 17 24* 36*  CREATININE 1.01 1.16* 1.17*  CALCIUM 8.5 8.8 8.2*   Liver Function Tests:  Recent Labs Lab 07/01/13 0618  AST 31  ALT 38*  ALKPHOS 96  BILITOT 0.5  PROT 7.0  ALBUMIN 3.2*   No results found for this basename: LIPASE, AMYLASE,  in the last 168 hours No  results found for this basename: AMMONIA,  in the last 168 hours CBC:  Recent Labs Lab 06/28/13 1615 06/30/13 0954 07/01/13 0618  WBC 8.7 11.4* 7.7  NEUTROABS 5.7 9.7*  --   HGB 9.0* 9.7* 8.6*  HCT 29.3* 30.6* 27.8*  MCV 73.4* 72.9* 72.8*  PLT 337 386 316   Cardiac Enzymes:  Recent Labs Lab 06/28/13 1615 06/30/13 0954  TROPONINI <0.30 <0.30   BNP (last 3 results)  Recent Labs  06/28/13 1615 06/30/13 0954  PROBNP 185.1* 359.6*   CBG:  Recent Labs Lab 07/01/13 1133 07/01/13 1650 07/01/13 2054 07/02/13 0737 07/02/13 1120  GLUCAP 333* 260* 321* 355* 298*    No results found for this or any previous visit (from the past 240 hour(s)).   Studies: No results found.  Scheduled Meds: . ipratropium  0.5 mg Nebulization Q4H   And  . albuterol  2.5 mg Nebulization Q4H  . allopurinol  100 mg Oral BID  . amitriptyline  100 mg Oral QHS  . antiseptic oral rinse  15 mL Mouth Rinse BID  . aspirin EC  81 mg Oral Daily  . atorvastatin  40 mg Oral QHS  . furosemide  40 mg Oral Daily  . heparin  5,000 Units Subcutaneous Q8H  . insulin aspart  0-20 Units Subcutaneous TID WC  . insulin aspart  0-5 Units Subcutaneous QHS  . insulin glargine  30 Units Subcutaneous QHS  . metFORMIN  1,000 mg Oral BID WC  . methylPREDNISolone (SOLU-MEDROL) injection  60 mg Intravenous Q8H  . metoprolol succinate  50 mg Oral Daily  . pantoprazole  40 mg Oral Daily  . sodium chloride  3 mL Intravenous Q12H   Continuous Infusions:   Principal Problem:   Obstructive chronic bronchitis with exacerbation Active Problems:   DM type 2 with diabetic peripheral neuropathy   VITAMIN B12 DEFICIENCY   Essential hypertension, benign   COPD (chronic obstructive pulmonary disease)   Tachycardia   COPD exacerbation  Time spent: 2min  [name], [place] [facility] Pager [phone]. If 7PM-7AM, please contact night-coverage at [email or web address], password [facility] 07/02/2013, 12:19 PM  LOS: 2 days

## 2013-07-03 LAB — GLUCOSE, CAPILLARY
Glucose-Capillary: 224 mg/dL — ABNORMAL HIGH (ref 70–99)
Glucose-Capillary: 280 mg/dL — ABNORMAL HIGH (ref 70–99)

## 2013-07-03 NOTE — Progress Notes (Signed)
[facility] PROGRESS NOTE  [name] [id number] DOB: 1945/12/21 DOA: 06/30/2013 PCP: Tylene Fantasia, NP  Assessment/Plan: 1. COPD exacerbation  1. Failed outpatient tx 2. Pt reports feeling better but still wheezing with decreased BS. Still requires O2 3. Cont on scheduled nebs and IV steroids, wean as tolerated 4. Cont O2 as needed and wean as tolerated 2. DM  1. Cont on SSI 2. BS poorly controlled, likely secondary to steroids 3. Will increase lantus from 26 units to 30 units 3. HTN  1. BP stable 2. Cont meds 4. Tachycardia  1. Suspect secondary to neb tx 2. Improved overnight 3. Monitor for now 5. DVT prophylaxis  1. Heparin subq  Code Status: Full Family Communication: Pt in room (indicate person spoken with, relationship, and if by phone, the number) Disposition Plan: Pending   HPI/Subjective: No acute events noted overnight. Reports feeling "a little better."  Objective: Filed Vitals:   07/03/13 0353 07/03/13 0500 07/03/13 0755 07/03/13 1205  BP:  132/56    Pulse:  93    Temp:  97.5 F (36.4 C)    TempSrc:  Oral    Resp:  16    Height:      Weight:      SpO2: 97% 95% 96% 93%   No intake or output data in the 24 hours ending 07/03/13 1415 Filed Weights   06/30/13 0942 06/30/13 1503  Weight: 99.791 kg (220 lb) 99.791 kg (220 lb)    Exam:   General:  Awake, in nad  Cardiovascular: regular, s1, s2  Respiratory: normal resp effort, decreased BS throughout, wheezing  Abdomen: soft, nondistended  Musculoskeletal: perfused, no clubbing   Data Reviewed: Basic Metabolic Panel:  Recent Labs Lab 06/28/13 1615 06/30/13 0954 07/01/13 0618  NA 137 135 134*  K 3.9 4.7 5.0  CL 101 98 95*  CO2 27 27 28   GLUCOSE 243* 275* 403*  BUN 17 24* 36*  CREATININE 1.01 1.16* 1.17*  CALCIUM 8.5 8.8 8.2*   Liver Function Tests:  Recent Labs Lab 07/01/13 0618  AST 31  ALT 38*  ALKPHOS 96  BILITOT 0.5  PROT 7.0  ALBUMIN 3.2*   No  results found for this basename: LIPASE, AMYLASE,  in the last 168 hours No results found for this basename: AMMONIA,  in the last 168 hours CBC:  Recent Labs Lab 06/28/13 1615 06/30/13 0954 07/01/13 0618  WBC 8.7 11.4* 7.7  NEUTROABS 5.7 9.7*  --   HGB 9.0* 9.7* 8.6*  HCT 29.3* 30.6* 27.8*  MCV 73.4* 72.9* 72.8*  PLT 337 386 316   Cardiac Enzymes:  Recent Labs Lab 06/28/13 1615 06/30/13 0954  TROPONINI <0.30 <0.30   BNP (last 3 results)  Recent Labs  06/28/13 1615 06/30/13 0954  PROBNP 185.1* 359.6*   CBG:  Recent Labs Lab 07/02/13 1120 07/02/13 1646 07/02/13 2054 07/03/13 0815 07/03/13 1125  GLUCAP 298* 192* 308* 224* 280*    No results found for this or any previous visit (from the past 240 hour(s)).   Studies: No results found.  Scheduled Meds: . ipratropium  0.5 mg Nebulization Q4H   And  . albuterol  2.5 mg Nebulization Q4H  . allopurinol  100 mg Oral BID  . amitriptyline  100 mg Oral QHS  . antiseptic oral rinse  15 mL Mouth Rinse BID  . aspirin EC  81 mg Oral Daily  . atorvastatin  40 mg Oral QHS  . furosemide  40 mg Oral Daily  .  heparin  5,000 Units Subcutaneous Q8H  . insulin aspart  0-20 Units Subcutaneous TID WC  . insulin aspart  0-5 Units Subcutaneous QHS  . insulin glargine  30 Units Subcutaneous QHS  . metFORMIN  1,000 mg Oral BID WC  . methylPREDNISolone (SOLU-MEDROL) injection  60 mg Intravenous Q12H  . metoprolol succinate  50 mg Oral Daily  . pantoprazole  40 mg Oral Daily  . sodium chloride  3 mL Intravenous Q12H   Continuous Infusions:   Principal Problem:   Obstructive chronic bronchitis with exacerbation Active Problems:   DM type 2 with diabetic peripheral neuropathy   VITAMIN B12 DEFICIENCY   Essential hypertension, benign   COPD (chronic obstructive pulmonary disease)   Tachycardia   COPD exacerbation  Time spent: 50min  CHIU, [place] [facility] Pager [phone]. If 7PM-7AM, please contact  night-coverage at [email or web address], password [facility] 07/03/2013, 2:15 PM  LOS: 3 days

## 2013-07-04 DIAGNOSIS — E669 Obesity, unspecified: Secondary | ICD-10-CM

## 2013-07-04 DIAGNOSIS — I10 Essential (primary) hypertension: Secondary | ICD-10-CM

## 2013-07-04 LAB — GLUCOSE, CAPILLARY
Glucose-Capillary: 175 mg/dL — ABNORMAL HIGH (ref 70–99)
Glucose-Capillary: 294 mg/dL — ABNORMAL HIGH (ref 70–99)
Glucose-Capillary: 200 mg/dL — ABNORMAL HIGH (ref 70–99)

## 2013-07-04 MED ORDER — BENZONATATE 100 MG PO CAPS
100.0000 mg | ORAL_CAPSULE | Freq: Three times a day (TID) | ORAL | Status: DC | PRN
  Administered 2013-07-04 – 2013-07-07 (×4): 100 mg via ORAL
  Filled 2013-07-04 (×6): qty 1

## 2013-07-04 MED ORDER — METHYLPREDNISOLONE SODIUM SUCC 125 MG IJ SOLR
60.0000 mg | Freq: Three times a day (TID) | INTRAMUSCULAR | Status: DC
  Administered 2013-07-04 – 2013-07-06 (×8): 60 mg via INTRAVENOUS
  Filled 2013-07-04 (×9): qty 2

## 2013-07-04 NOTE — Progress Notes (Signed)
[facility] PROGRESS NOTE  [name] [id number] DOB: March 26, 1946 DOA: 06/30/2013 PCP: Tylene Fantasia, NP  Assessment/Plan: 1. COPD exacerbation  1. Failed outpatient tx 2. Still wheezing with decreased BS, albeit somewhat improved from yesterday. Still requires O2 (3L currently, had been on 2L at home) 3. Cont on scheduled nebs and IV steroids, wean as tolerated 4. Cont O2 as needed and wean as tolerated 2. DM  1. Cont on SSI 2. BS poorly controlled, likely secondary to steroids 3. Will increase lantus from 26 units to 30 units 3. HTN  1. BP stable 2. Cont meds 4. Tachycardia  1. Suspect secondary to neb tx 2. Improved overnight 3. Monitor for now 5. DVT prophylaxis  1. Heparin subq  Code Status: Full Family Communication: Pt in room (indicate person spoken with, relationship, and if by phone, the number) Disposition Plan: Pending   HPI/Subjective: No acute events noted overnight. Reports feeling "a little better."  Objective: Filed Vitals:   07/03/13 2302 07/04/13 0431 07/04/13 0614 07/04/13 0728  BP:   127/54   Pulse:   92   Temp:   97.6 F (36.4 C)   TempSrc:   Oral   Resp:   15   Height:      Weight:      SpO2: 96% 98% 99% 96%    Intake/Output Summary (Last 24 hours) at 07/04/13 0843 Last data filed at 07/03/13 1442  Gross per 24 hour  Intake    240 ml  Output      0 ml  Net    240 ml   Filed Weights   06/30/13 0942 06/30/13 1503  Weight: 99.791 kg (220 lb) 99.791 kg (220 lb)    Exam:   General:  Awake, in nad  Cardiovascular: regular, s1, s2  Respiratory: normal resp effort, decreased BS throughout, wheezing  Abdomen: soft, nondistended  Musculoskeletal: perfused, no clubbing   Data Reviewed: Basic Metabolic Panel:  Recent Labs Lab 06/28/13 1615 06/30/13 0954 07/01/13 0618  NA 137 135 134*  K 3.9 4.7 5.0  CL 101 98 95*  CO2 27 27 28   GLUCOSE 243* 275* 403*  BUN 17 24* 36*  CREATININE 1.01 1.16* 1.17*   CALCIUM 8.5 8.8 8.2*   Liver Function Tests:  Recent Labs Lab 07/01/13 0618  AST 31  ALT 38*  ALKPHOS 96  BILITOT 0.5  PROT 7.0  ALBUMIN 3.2*   No results found for this basename: LIPASE, AMYLASE,  in the last 168 hours No results found for this basename: AMMONIA,  in the last 168 hours CBC:  Recent Labs Lab 06/28/13 1615 06/30/13 0954 07/01/13 0618  WBC 8.7 11.4* 7.7  NEUTROABS 5.7 9.7*  --   HGB 9.0* 9.7* 8.6*  HCT 29.3* 30.6* 27.8*  MCV 73.4* 72.9* 72.8*  PLT 337 386 316   Cardiac Enzymes:  Recent Labs Lab 06/28/13 1615 06/30/13 0954  TROPONINI <0.30 <0.30   BNP (last 3 results)  Recent Labs  06/28/13 1615 06/30/13 0954  PROBNP 185.1* 359.6*   CBG:  Recent Labs Lab 07/03/13 0815 07/03/13 1125 07/03/13 1623 07/03/13 2057 07/04/13 0737  GLUCAP 224* 280* 179* 230* 175*    No results found for this or any previous visit (from the past 240 hour(s)).   Studies: No results found.  Scheduled Meds: . ipratropium  0.5 mg Nebulization Q4H   And  . albuterol  2.5 mg Nebulization Q4H  . allopurinol  100 mg Oral BID  . amitriptyline  100 mg Oral QHS  . antiseptic oral rinse  15 mL Mouth Rinse BID  . aspirin EC  81 mg Oral Daily  . atorvastatin  40 mg Oral QHS  . furosemide  40 mg Oral Daily  . heparin  5,000 Units Subcutaneous Q8H  . insulin aspart  0-20 Units Subcutaneous TID WC  . insulin aspart  0-5 Units Subcutaneous QHS  . insulin glargine  30 Units Subcutaneous QHS  . metFORMIN  1,000 mg Oral BID WC  . methylPREDNISolone (SOLU-MEDROL) injection  60 mg Intravenous Q12H  . metoprolol succinate  50 mg Oral Daily  . pantoprazole  40 mg Oral Daily  . sodium chloride  3 mL Intravenous Q12H   Continuous Infusions:   Principal Problem:   Obstructive chronic bronchitis with exacerbation Active Problems:   DM type 2 with diabetic peripheral neuropathy   VITAMIN B12 DEFICIENCY   Essential hypertension, benign   COPD (chronic obstructive  pulmonary disease)   Tachycardia   COPD exacerbation  Time spent: 63min  Christphor Groft, [place] [facility] Pager [phone]. If 7PM-7AM, please contact night-coverage at [email or web address], password [facility] 07/04/2013, 8:43 AM  LOS: 4 days

## 2013-07-05 DIAGNOSIS — E871 Hypo-osmolality and hyponatremia: Secondary | ICD-10-CM | POA: Diagnosis not present

## 2013-07-05 LAB — GLUCOSE, CAPILLARY
Glucose-Capillary: 239 mg/dL — ABNORMAL HIGH (ref 70–99)
Glucose-Capillary: 325 mg/dL — ABNORMAL HIGH (ref 70–99)
Glucose-Capillary: 135 mg/dL — ABNORMAL HIGH (ref 70–99)
Glucose-Capillary: 159 mg/dL — ABNORMAL HIGH (ref 70–99)

## 2013-07-05 LAB — BASIC METABOLIC PANEL
BUN: 68 mg/dL — ABNORMAL HIGH (ref 6–23)
Chloride: 97 mEq/L (ref 96–112)
Creatinine, Ser: 1.74 mg/dL — ABNORMAL HIGH (ref 0.50–1.10)
GFR calc Af Amer: 34 mL/min — ABNORMAL LOW (ref >90)
GFR calc non Af Amer: 29 mL/min — ABNORMAL LOW (ref >90)
Sodium: 140 mEq/L (ref 135–145)

## 2013-07-05 LAB — HEMOGLOBIN A1C
Hgb A1c MFr Bld: 11.7 % — ABNORMAL HIGH (ref <5.7)
Mean Plasma Glucose: 289 mg/dL — ABNORMAL HIGH (ref <117)

## 2013-07-05 MED ORDER — PEG 3350-KCL-NA BICARB-NACL 420 G PO SOLR: 4000.0000 mL | Freq: Once | ORAL | Status: DC

## 2013-07-05 MED ORDER — GUAIFENESIN-CODEINE 100-10 MG/5ML PO SOLN
5.0000 mL | Freq: Four times a day (QID) | ORAL | Status: DC | PRN
  Administered 2013-07-05 – 2013-07-06 (×2): 5 mL via ORAL
  Filled 2013-07-05 (×2): qty 5

## 2013-07-05 MED ORDER — INSULIN ASPART 100 UNIT/ML ~~LOC~~ SOLN: 0.0000 [IU] | Freq: Three times a day (TID) | SUBCUTANEOUS | Status: DC

## 2013-07-05 MED ORDER — INSULIN ASPART 100 UNIT/ML ~~LOC~~ SOLN: 0.0000 [IU] | Freq: Every day | SUBCUTANEOUS | Status: DC

## 2013-07-05 MED ORDER — LEVOFLOXACIN 750 MG PO TABS
750.0000 mg | ORAL_TABLET | Freq: Every day | ORAL | Status: DC
  Administered 2013-07-05 – 2013-07-07 (×3): 750 mg via ORAL
  Filled 2013-07-05 (×3): qty 1

## 2013-07-05 MED ORDER — INSULIN GLARGINE 100 UNIT/ML ~~LOC~~ SOLN
35.0000 [IU] | Freq: Every day | SUBCUTANEOUS | Status: DC
  Administered 2013-07-05: 35 [IU] via SUBCUTANEOUS
  Filled 2013-07-05 (×2): qty 0.35

## 2013-07-05 MED ORDER — INSULIN ASPART 100 UNIT/ML ~~LOC~~ SOLN
4.0000 [IU] | Freq: Three times a day (TID) | SUBCUTANEOUS | Status: DC
  Administered 2013-07-05 – 2013-07-07 (×7): 4 [IU] via SUBCUTANEOUS

## 2013-07-05 NOTE — Progress Notes (Signed)
[facility] PROGRESS NOTE  [name] [id number] DOB: 1945-08-22 DOA: 06/30/2013 PCP: Tylene Fantasia, NP  Assessment/Plan: 1. COPD exacerbation  1. Failed outpatient tx 2. Little wheeze but BS remain decreased. Sats 94-99 range on 3L. Frequent coughing. Will wean o2 as tolereated 3. Cont on scheduled nebs and IV steroids. Add levaquin day #2.  2. DM  1. Cont on SSI 2. BS poorly controlled, likely secondary to steroids 3. Will increase lantus from 30 units to 35 units. Will add meal coverage 4. Will check A1C 3. HTN  1. BP stable 2. Cont meds 4. Tachycardia  1. Suspect secondary to neb tx 2. Continues to improve 3. Monitor for now 5. DVT prophylaxis  1. Heparin subq  Hyponatremia: mild. Will monitor  Anemia: appears to be at baseline. Hx IDA. Monitor. No s/sx overt bleeding  Code Status: full Family Communication: none present Disposition Plan: home when ready hopefully 24-48 hours   Consultants:  none  Procedures:  none  Antibiotics:  Levaquin 07/04/13>>>  HPI/Subjective: Sitting up eating breakfast. Frequent cough,  Moist, non-productive. Denies pain/discomfort. States cough kept her awake last night  Objective: Filed Vitals:   07/05/13 0500  BP: 126/43  Pulse: 96  Temp: 97.7 F (36.5 C)  Resp: 16    Intake/Output Summary (Last 24 hours) at 07/05/13 0915 Last data filed at 07/04/13 1813  Gross per 24 hour  Intake    240 ml  Output      0 ml  Net    240 ml   Filed Weights   06/30/13 0942 06/30/13 1503  Weight: 99.791 kg (220 lb) 99.791 kg (220 lb)    Exam:   General:  Obese NAD  Cardiovascular: RRR No MGR trace LE edema  Respiratory: mild increased work of breathing with conversation, frequent coughing, BS diminished particularly on right, otherwise diffuse coarseness, rhonchi, little wheeze  Abdomen: obese soft +BS non-tender to palpation  Musculoskeletal: no clubbing no cyanosis   Data Reviewed: Basic Metabolic  Panel:  Recent Labs Lab 06/28/13 1615 06/30/13 0954 07/01/13 0618  NA 137 135 134*  K 3.9 4.7 5.0  CL 101 98 95*  CO2 27 27 28   GLUCOSE 243* 275* 403*  BUN 17 24* 36*  CREATININE 1.01 1.16* 1.17*  CALCIUM 8.5 8.8 8.2*   Liver Function Tests:  Recent Labs Lab 07/01/13 0618  AST 31  ALT 38*  ALKPHOS 96  BILITOT 0.5  PROT 7.0  ALBUMIN 3.2*   No results found for this basename: LIPASE, AMYLASE,  in the last 168 hours No results found for this basename: AMMONIA,  in the last 168 hours CBC:  Recent Labs Lab 06/28/13 1615 06/30/13 0954 07/01/13 0618  WBC 8.7 11.4* 7.7  NEUTROABS 5.7 9.7*  --   HGB 9.0* 9.7* 8.6*  HCT 29.3* 30.6* 27.8*  MCV 73.4* 72.9* 72.8*  PLT 337 386 316   Cardiac Enzymes:  Recent Labs Lab 06/28/13 1615 06/30/13 0954  TROPONINI <0.30 <0.30   BNP (last 3 results)  Recent Labs  06/28/13 1615 06/30/13 0954  PROBNP 185.1* 359.6*   CBG:  Recent Labs Lab 07/04/13 0737 07/04/13 1142 07/04/13 1615 07/04/13 2102 07/05/13 0842  GLUCAP 175* 294* 287* 200* 239*    No results found for this or any previous visit (from the past 240 hour(s)).   Studies: No results found.  Scheduled Meds: . ipratropium  0.5 mg Nebulization Q4H   And  . albuterol  2.5 mg Nebulization Q4H  .  allopurinol  100 mg Oral BID  . amitriptyline  100 mg Oral QHS  . antiseptic oral rinse  15 mL Mouth Rinse BID  . aspirin EC  81 mg Oral Daily  . atorvastatin  40 mg Oral QHS  . furosemide  40 mg Oral Daily  . heparin  5,000 Units Subcutaneous Q8H  . insulin aspart  0-20 Units Subcutaneous TID WC  . insulin aspart  0-5 Units Subcutaneous QHS  . insulin glargine  30 Units Subcutaneous QHS  . metFORMIN  1,000 mg Oral BID WC  . methylPREDNISolone (SOLU-MEDROL) injection  60 mg Intravenous Q8H  . metoprolol succinate  50 mg Oral Daily  . pantoprazole  40 mg Oral Daily  . sodium chloride  3 mL Intravenous Q12H   Continuous Infusions:   Principal Problem:    Obstructive chronic bronchitis with exacerbation Active Problems:   DM type 2 with diabetic peripheral neuropathy   VITAMIN B12 DEFICIENCY   Essential hypertension, benign   COPD (chronic obstructive pulmonary disease)   Tachycardia   COPD exacerbation    Time spent: 40 minutes    [place] [facility] Pager [phone]. If 7PM-7AM, please contact night-coverage at [email or web address], password [facility] 07/05/2013, 9:15 AM  LOS: 5 days

## 2013-07-05 NOTE — Progress Notes (Signed)
Inpatient Diabetes Program Recommendations  AACE/ADA: New Consensus Statement on Inpatient Glycemic Control (2013)  Target Ranges:  Prepandial:   less than 140 mg/dL      Peak postprandial:   less than 180 mg/dL (1-2 hours)      Critically ill patients:  140 - 180 mg/dL   Results for LANORE, DOOLIN (MRN [id number]) as of 07/05/2013 09:12  Ref. Range 07/04/2013 07:37 07/04/2013 11:42 07/04/2013 16:15 07/04/2013 21:02 07/05/2013 08:42  Glucose-Capillary Latest Range: 70-99 mg/dL 175 (H) 294 (H) 287 (H) 200 (H) 239 (H)    Inpatient Diabetes Program Recommendations Insulin - Basal: Please consider increasing Lantus to 35 units QHS. Insulin - Meal Coverage: Please consider ordering Novolog meal coverage while inpatient and receiving steroids.  Recommend ordering Novolog 4 untis TID with meals. HgbA1C: Please consider ordering an A1C to determine glycemic control over the past 2-3 months.  Note: Patient has a history of diabetes and takes Metformin 1000 mg BID, Lantus 26 units QHS, and Humalog 14-18 units TID as an outpatient for diabetes management.  Currently, patient is ordered to receive Lantus 30 units QHS, Novolog 0-20 AC, Novolog 0-5 units HS, and Metformin 1000 mg BID for inpatient glycemic control.  Blood glucose on 07/04/13 ranged from 175-294 mg/dl and fasting glucose this morning was 239 mg/dl.  Patient is ordered Solumedrol 60 mg Q8H which is contributing to hyperglycemia.  Please consider increasing Lantus to 35 units QHS, ordering Novolog 4 units TID with meals for meal coverage, and ordering an A1C.  Will continue to follow.  Thanks, Barnie Alderman, RN, MSN, CCRN Diabetes Coordinator Inpatient Diabetes Program [phone] (Team Pager) [phone] ([facility] office) [phone] [facility] office)

## 2013-07-05 NOTE — Progress Notes (Signed)
Pt seen and examined. Agree with above. Pt with continued wheezing and o2 dependence. Agree with cough suppressant as needed. Cont with Duonebs, steroids, and O2 as needed. Wean as tolerated.

## 2013-07-06 DIAGNOSIS — N179 Acute kidney failure, unspecified: Secondary | ICD-10-CM | POA: Diagnosis present

## 2013-07-06 LAB — BASIC METABOLIC PANEL WITH GFR
BUN: 63 mg/dL — ABNORMAL HIGH (ref 6–23)
CO2: 33 meq/L — ABNORMAL HIGH (ref 19–32)
Calcium: 6.7 mg/dL — ABNORMAL LOW (ref 8.4–10.5)
Chloride: 99 meq/L (ref 96–112)
Creatinine, Ser: 1.56 mg/dL — ABNORMAL HIGH (ref 0.50–1.10)
GFR calc Af Amer: 38 mL/min — ABNORMAL LOW
GFR calc non Af Amer: 33 mL/min — ABNORMAL LOW
Glucose, Bld: 241 mg/dL — ABNORMAL HIGH (ref 70–99)
Potassium: 4.3 meq/L (ref 3.5–5.1)
Sodium: 141 meq/L (ref 135–145)

## 2013-07-06 LAB — CBC
HCT: 28.9 % — ABNORMAL LOW (ref 36.0–46.0)
MCHC: 31.5 g/dL (ref 30.0–36.0)
Platelets: 318 10*3/uL (ref 150–400)
RDW: 16.1 % — ABNORMAL HIGH (ref 11.5–15.5)
WBC: 13.9 10*3/uL — ABNORMAL HIGH (ref 4.0–10.5)

## 2013-07-06 LAB — GLUCOSE, CAPILLARY
Glucose-Capillary: 299 mg/dL — ABNORMAL HIGH (ref 70–99)
Glucose-Capillary: 274 mg/dL — ABNORMAL HIGH (ref 70–99)

## 2013-07-06 MED ORDER — INSULIN GLARGINE 100 UNIT/ML ~~LOC~~ SOLN
40.0000 [IU] | Freq: Every day | SUBCUTANEOUS | Status: DC
  Administered 2013-07-06: 40 [IU] via SUBCUTANEOUS
  Filled 2013-07-06 (×2): qty 0.4

## 2013-07-06 NOTE — Progress Notes (Signed)
[facility] PROGRESS NOTE  [name] [id number] DOB: 11-Dec-1945 DOA: 06/30/2013 PCP: Tylene Fantasia, NP  Assessment/Plan: 1. COPD exacerbation  1. Failed outpatient tx 2. Continues with wheeze decreased breath sounds but slight improvement. Sats 91-93 range on 3L. Continues with coughing only slightly less. Will wean o2 as tolereated 3. Cont on scheduled nebs and IV steroids at current dose. Anticipate beginning wean tomorrow.  levaquin day #3.  2. DM  1. Uncontrolled in setting of steroids. A1c 11. 7.  2. BS poorly controlled, likely secondary to steroids 3. Will increase lantus from 35 units to 40 units. continue meal coverage. Appetite good 3. HTN  1. BP stable 2. Cont meds 4. Tachycardia  1. Suspect secondary to neb tx 2. Continues to improve 3. Monitor for now 5. DVT prophylaxis  1. Heparin subq Hyponatremia: Resolved. Will monitor   Anemia: appears to be at baseline. Hx IDA. Monitor. No s/sx overt bleeding   Acute renal failure: likely with chronic component as well due to uncontrolled DM. Creatinine on admission 1.74 and is 1.56 today. Pts home lasix was continued. Will hold for now. Chart review indicates baseline range  1.01-1.17. Urine output adequate. Will monitor. Will consider [place] if creatinine does not continue to improve. Will need close OP follow up as well.     Code Status: full Family Communication: none present Disposition Plan: home when ready hopefully 24-48 hours   Consultants:  none  Procedures:  none  Antibiotics:  Levaquin 07/04/13>>  HPI/Subjective: Sitting up in bed watching TV denies pain. Reports little improvement with sob and some improvement with coughing  Objective: Filed Vitals:   07/06/13 0629  BP: 134/61  Pulse: 92  Temp: 97.7 F (36.5 C)  Resp: 20    Intake/Output Summary (Last 24 hours) at 07/06/13 1130 Last data filed at 07/05/13 1352  Gross per 24 hour  Intake    240 ml  Output      0 ml  Net     240 ml   Filed Weights   06/30/13 0942 06/30/13 1503  Weight: 99.791 kg (220 lb) 99.791 kg (220 lb)    Exam:   General:  Obese somewhat ill appearing NAD  Cardiovascular: RRR no MGR Trace LE edema  Respiratory: mild increased work of breathing with conversation. Frequent moist non-productive cough. Slightly improved air flow. Faint expiratory wheeze diffusely.   Abdomen: obese soft +BS non-tender to palpation   Musculoskeletal: no clubbing no cyanosis.    Data Reviewed: Basic Metabolic Panel:  Recent Labs Lab 06/30/13 0954 07/01/13 0618 07/05/13 0953 07/06/13 0620  NA 135 134* 140 141  K 4.7 5.0 4.8 4.3  CL 98 95* 97 99  CO2 27 28 28  33*  GLUCOSE 275* 403* 352* 241*  BUN 24* 36* 68* 63*  CREATININE 1.16* 1.17* 1.74* 1.56*  CALCIUM 8.8 8.2* 6.9* 6.7*   Liver Function Tests:  Recent Labs Lab 07/01/13 0618  AST 31  ALT 38*  ALKPHOS 96  BILITOT 0.5  PROT 7.0  ALBUMIN 3.2*   No results found for this basename: LIPASE, AMYLASE,  in the last 168 hours No results found for this basename: AMMONIA,  in the last 168 hours CBC:  Recent Labs Lab 06/30/13 0954 07/01/13 0618 07/06/13 0620  WBC 11.4* 7.7 13.9*  NEUTROABS 9.7*  --   --   HGB 9.7* 8.6* 9.1*  HCT 30.6* 27.8* 28.9*  MCV 72.9* 72.8* 72.4*  PLT 386 316 318   Cardiac Enzymes:  Recent Labs Lab 06/30/13 0954  TROPONINI <0.30   BNP (last 3 results)  Recent Labs  06/28/13 1615 06/30/13 0954  PROBNP 185.1* 359.6*   CBG:  Recent Labs Lab 07/05/13 0842 07/05/13 1143 07/05/13 1641 07/05/13 2111 07/06/13 0746  GLUCAP 239* 325* 135* 159* 235*    No results found for this or any previous visit (from the past 240 hour(s)).   Studies: No results found.  Scheduled Meds: . ipratropium  0.5 mg Nebulization Q4H   And  . albuterol  2.5 mg Nebulization Q4H  . allopurinol  100 mg Oral BID  . amitriptyline  100 mg Oral QHS  . antiseptic oral rinse  15 mL Mouth Rinse BID  . aspirin EC   81 mg Oral Daily  . atorvastatin  40 mg Oral QHS  . heparin  5,000 Units Subcutaneous Q8H  . insulin aspart  0-20 Units Subcutaneous TID WC  . insulin aspart  0-5 Units Subcutaneous QHS  . insulin aspart  4 Units Subcutaneous TID WC  . insulin glargine  40 Units Subcutaneous QHS  . levofloxacin  750 mg Oral Daily  . methylPREDNISolone (SOLU-MEDROL) injection  60 mg Intravenous Q8H  . metoprolol succinate  50 mg Oral Daily  . pantoprazole  40 mg Oral Daily  . sodium chloride  3 mL Intravenous Q12H   Continuous Infusions:   Principal Problem:   Obstructive chronic bronchitis with exacerbation Active Problems:   DM type 2 with diabetic peripheral neuropathy   VITAMIN B12 DEFICIENCY   OBESITY   ANEMIA, IRON DEFICIENCY   Essential hypertension, benign   SLEEP APNEA   Acid reflux   COPD (chronic obstructive pulmonary disease)   Tachycardia   COPD exacerbation   Hyponatremia   Acute renal failure    Time spent: 40 minutes    [place] [facility] Pager [phone]. If 7PM-7AM, please contact night-coverage at [email or web address], password [facility] 07/06/2013, 11:30 AM  LOS: 6 days

## 2013-07-06 NOTE — Care Management Note (Signed)
    Page 1 of 1   07/07/2013     11:34:06 AM   CARE MANAGEMENT NOTE 07/07/2013  Patient:  [name], [name]   Account Number:  [id number]  Date Initiated:  07/06/2013  Documentation initiated by:  Claretha Cooper  Subjective/Objective Assessment:   Pt from home. COPD exacerbation. Plan to return home. Chronic O2     Action/Plan:   Anticipated DC Date:  07/07/2013   Anticipated DC Plan:  [place]  CM consult      Choice offered to / List presented to:             Status of service:  Completed, signed off Medicare Important Message given?  YES (If response is "NO", the following Medicare IM given date fields will be blank) Date Medicare IM given:  07/07/2013 Date Additional Medicare IM given:    Discharge Disposition:  HOME/SELF CARE  Per UR Regulation:    If discussed at Long Length of Stay Meetings, dates discussed:   07/06/2013    Comments:  07/06/13 Claretha Cooper RN BSN CM Declined HH/DME

## 2013-07-06 NOTE — Progress Notes (Signed)
Pt seen and examined. Agree with assessment and plan per [name], NP. Pt with continued wheezing and sob. Pt reports feeling somewhat better. Agree with continuing steroids and nebs and wean as tolerated. Will follow.

## 2013-07-06 NOTE — Progress Notes (Signed)
Inpatient Diabetes Program Recommendations  AACE/ADA: New Consensus Statement on Inpatient Glycemic Control (2013)  Target Ranges:  Prepandial:   less than 140 mg/dL      Peak postprandial:   less than 180 mg/dL (1-2 hours)      Critically ill patients:  140 - 180 mg/dL   Results for SYRAH, DINNEEN (MRN [id number]) as of 07/06/2013 08:43  Ref. Range 07/05/2013 11:43 07/05/2013 16:41 07/05/2013 21:11 07/06/2013 07:46  Glucose-Capillary Latest Range: 70-99 mg/dL 325 (H) 135 (H) 159 (H) 235 (H)   Inpatient Diabetes Program Recommendations Insulin - Basal: Please consider increasing Lantus to 40 units QHS.  Thanks, Barnie Alderman, RN, MSN, CCRN Diabetes Coordinator Inpatient Diabetes Program [phone] (Team Pager) [phone] ([place] office) [phone] [facility] office)

## 2013-07-07 DIAGNOSIS — G4730 Sleep apnea, unspecified: Secondary | ICD-10-CM

## 2013-07-07 LAB — CBC
HCT: 28.7 % — ABNORMAL LOW (ref 36.0–46.0)
Hemoglobin: 8.9 g/dL — ABNORMAL LOW (ref 12.0–15.0)
MCH: 22.5 pg — ABNORMAL LOW (ref 26.0–34.0)
MCHC: 31 g/dL (ref 30.0–36.0)
MCV: 72.7 fL — ABNORMAL LOW (ref 78.0–100.0)
Platelets: 311 10*3/uL (ref 150–400)
RBC: 3.95 MIL/uL (ref 3.87–5.11)
RDW: 16.2 % — ABNORMAL HIGH (ref 11.5–15.5)
WBC: 12.4 10*3/uL — ABNORMAL HIGH (ref 4.0–10.5)

## 2013-07-07 LAB — BASIC METABOLIC PANEL
BUN: 49 mg/dL — ABNORMAL HIGH (ref 6–23)
CO2: 33 mEq/L — ABNORMAL HIGH (ref 19–32)
Calcium: 7 mg/dL — ABNORMAL LOW (ref 8.4–10.5)
Chloride: 97 mEq/L (ref 96–112)
Creatinine, Ser: 1.26 mg/dL — ABNORMAL HIGH (ref 0.50–1.10)
GFR calc Af Amer: 50 mL/min — ABNORMAL LOW (ref >90)
GFR calc non Af Amer: 43 mL/min — ABNORMAL LOW (ref >90)
Glucose, Bld: 258 mg/dL — ABNORMAL HIGH (ref 70–99)
Potassium: 4.2 mEq/L (ref 3.5–5.1)
Sodium: 138 mEq/L (ref 135–145)

## 2013-07-07 LAB — GLUCOSE, CAPILLARY
Glucose-Capillary: 213 mg/dL — ABNORMAL HIGH (ref 70–99)
Glucose-Capillary: 293 mg/dL — ABNORMAL HIGH (ref 70–99)

## 2013-07-07 MED ORDER — GUAIFENESIN-CODEINE 100-10 MG/5ML PO SOLN: 5.0000 mL | Freq: Four times a day (QID) | ORAL | Status: DC | PRN

## 2013-07-07 MED ORDER — PREDNISONE 10 MG PO TABS: ORAL_TABLET | ORAL | Status: DC

## 2013-07-07 MED ORDER — METHYLPREDNISOLONE SODIUM SUCC 125 MG IJ SOLR
60.0000 mg | Freq: Two times a day (BID) | INTRAMUSCULAR | Status: DC
  Administered 2013-07-07: 60 mg via INTRAVENOUS
  Filled 2013-07-07: qty 2

## 2013-07-07 MED ORDER — LEVOFLOXACIN 750 MG PO TABS: 750.0000 mg | ORAL_TABLET | Freq: Every day | ORAL | Status: DC

## 2013-07-07 MED ORDER — BENZONATATE 100 MG PO CAPS: 100.0000 mg | ORAL_CAPSULE | Freq: Three times a day (TID) | ORAL | Status: DC | PRN

## 2013-07-07 MED ORDER — METHYLPREDNISOLONE SODIUM SUCC 125 MG IJ SOLR: 60.0000 mg | Freq: Two times a day (BID) | INTRAMUSCULAR | Status: DC

## 2013-07-07 NOTE — Discharge Summary (Signed)
Patient seen and examined, agree with that as above per [name], NP.  Patient was admitted with shortness of breath felt to be due to a COPD exacerbation. She's been on steroids, nebulizer treatments and antibiotics with improvement. She's been transitioned to oral steroids. She is maintaining saturations greater than 90% on room air. She is ready for discharge home. She has followup appointment with her pulmonologist on 12/17.  [name]

## 2013-07-07 NOTE — Progress Notes (Signed)
Patient being d/c home with prescriptions. Verbalizes understanding. IV cath removed and intact. No pain/swelling at site. Awaiting transport downstairs.

## 2013-07-07 NOTE — Discharge Summary (Signed)
Physician Discharge Summary  [name] [id number] DOB: 1945/08/23 DOA: 06/30/2013  PCP: Tylene Fantasia, NP  Admit date: 06/30/2013 Discharge date: 07/07/2013  Time spent: 40 minutes  Recommendations for Outpatient Follow-up:  1. [facility] 07/21/13 for evaluation of symtoms 2. PCP 1 week for trending of CBG and renal function as well as evaluation of symptoms   Discharge Diagnoses:  Principal Problem:   Obstructive chronic bronchitis with exacerbation Active Problems:   DM type 2 with diabetic peripheral neuropathy   VITAMIN B12 DEFICIENCY   OBESITY   ANEMIA, IRON DEFICIENCY   Essential hypertension, benign   SLEEP APNEA   Acid reflux   COPD (chronic obstructive pulmonary disease)   Tachycardia   COPD exacerbation   Hyponatremia   Acute renal failure   Discharge Condition: stable  Diet recommendation: carb modified  Filed Weights   06/30/13 0942 06/30/13 1503  Weight: 99.791 kg (220 lb) 99.791 kg (220 lb)    History of present illness:  With a hx of copd who was recently seen in the ED for COPD exacerbation two days prior, discharged home with a prednisone taper presented on 06/30/13 with cc sob. Pt's sx worsened despite home neb tx and she subsequently presented to the ED with worsened sob and wheezing. In the ED, the patient was found to have an unremarkable cxr with a 3L O2 dependence. Given failure of outpatient treatment, the hospitalists were consulted for admission.   Hospital Course:  1. COPD exacerbation  1. Failed outpatient tx 2. Continued with wheeze decreased breath sounds with slow improvement. Provided with nebs, oxygen and solumedrol as well as levaquin. On day of discharge improved air flow with decreased wheeze and less coughing. Able to maintain sats on room air at rest and with ambulation >90%.  3. Will be discharged with prednisone taper and levaquin for 4 more days to complete 7 day course.  4. Has follow up appointment  12/17 with pulmonary 2. DM  1. Uncontrolled in setting of steroids. A1c 11. 7.  2. BS poorly controlled, likely secondary to steroids 3. Home lantus increased while in hospital . Will discharge on home regimen as steroid taper beginning.  Appetite good. Recommend follow up with PCP 1 week for trending 3. HTN  1. BP stable during this hospitalization 2. Cont meds 4. Tachycardia  1. Suspect secondary to neb tx 2. Resolved at discharge  Hyponatremia: Resolved.    Anemia: Remained at baseline. Hx IDA.  No s/sx overt bleeding   Acute renal failure: likely with chronic component as well due to uncontrolled DM. Creatinine on admission 1.74 and is 1.26 on discharge. Nephrotoxins held initially. Chart review indicates baseline range 1.01-1.17. Urine output adequate. Will resume home lasix at discharge. Recommend close OP follow up as well.   Procedures:  none  Consultations:  none  Discharge Exam: Filed Vitals:   07/07/13 1017  BP: 168/77  Pulse: 99  Temp:   Resp:     General: calm comfortable NAD Cardiovascular: RRR No MGR trace LE edema Respiratory: normal effort. Improved air movement with less wheeze and less rhonchi. Able to get through exam without coughing excessively.   Discharge Instructions   Future Appointments Provider Department Dept Phone   07/21/2013 10:45 AM Melvenia Needles, NP [facility] [phone]       Medication List         allopurinol 100 MG tablet  Commonly known as:  ZYLOPRIM  Take 1 tablet by mouth 2 (two)  times daily.     amitriptyline 100 MG tablet  Commonly known as:  ELAVIL  Take 100 mg by mouth at bedtime.     aspirin 81 MG tablet  Take 81 mg by mouth daily.     atorvastatin 40 MG tablet  Commonly known as:  LIPITOR  Take 1 tablet by mouth at bedtime.     benzonatate 100 MG capsule  Commonly known as:  TESSALON  Take 1 capsule (100 mg total) by mouth 3 (three) times daily as needed for cough.      budesonide-formoterol 160-4.5 MCG/ACT inhaler  Commonly known as:  SYMBICORT  Inhale 2 puffs into the lungs 2 (two) times daily.     cyclobenzaprine 10 MG tablet  Commonly known as:  FLEXERIL  Take 10 mg by mouth 2 (two) times daily as needed for muscle spasms.     esomeprazole 40 MG capsule  Commonly known as:  NEXIUM  Take 40 mg by mouth daily before breakfast.     famotidine 40 MG tablet  Commonly known as:  PEPCID  Take 1 tablet (40 mg total) by mouth at bedtime.     furosemide 40 MG tablet  Commonly known as:  LASIX  Take 40 mg by mouth daily.     guaiFENesin-codeine 100-10 MG/5ML syrup  Take 5 mLs by mouth every 6 (six) hours as needed for cough.     HUMALOG KWIKPEN 100 UNIT/ML Sopn  Generic drug:  insulin lispro  Inject 14-18 Units into the skin 3 (three) times daily with meals. Based on SLIDING SCALE     HYDROcodone-acetaminophen 5-325 MG per tablet  Commonly known as:  NORCO/VICODIN  Take 1 tablet by mouth every 8 (eight) hours as needed for moderate pain or severe pain.     ipratropium-albuterol 0.5-2.5 (3) MG/3ML Soln  Commonly known as:  DUONEB  Inhale 3 mLs into the lungs 3 (three) times daily.     LANTUS SOLOSTAR 100 UNIT/ML Sopn  Generic drug:  Insulin Glargine  Inject 26 Units into the skin at bedtime.     levofloxacin 750 MG tablet  Commonly known as:  LEVAQUIN  Take 1 tablet (750 mg total) by mouth daily.     metFORMIN 1000 MG tablet  Commonly known as:  GLUCOPHAGE  Take 1,000 mg by mouth 2 (two) times daily with a meal.     metoprolol succinate 25 MG 24 hr tablet  Commonly known as:  TOPROL-XL  Take 50 mg by mouth daily.     naproxen sodium 220 MG tablet  Commonly known as:  ANAPROX  Take 220-440 mg by mouth daily as needed (for pain).     predniSONE 10 MG tablet  Commonly known as:  DELTASONE  Take 6 tabs on 12/4 and 12/5, take 4 tabs on 12/6 and 12/7, take 2 tabs on 12/8 and 12/9 then stop.       No Known Allergies     Follow-up  Information   Follow up with Tylene Fantasia, NP. Schedule an appointment as soon as possible for a visit in 1 week. (for evaluation of symptoms)    Specialty:  Nurse Practitioner   Contact information:   [address] [place] [phone]       Follow up with [place] On 07/21/2013. (Has appointment with NP at 10:45 am. )    Contact information:   [address] [place] [address]        The  results of significant diagnostics from this hospitalization (including imaging, microbiology, ancillary and laboratory) are listed below for reference.    Significant Diagnostic Studies: Dg Chest 2 View  06/30/2013   CLINICAL DATA:  Worsening shortness of breath, cough, congestion.  EXAM: CHEST  2 VIEW  COMPARISON:  06/28/2013  FINDINGS: Heart is upper limits normal in size. No confluent airspace opacities or effusions. Study is AP lordotic in positioning. No acute bony abnormality.  IMPRESSION: No active cardiopulmonary disease.   Electronically Signed   By: Rolm Baptise M.D.   On: 06/30/2013 12:18   Dg Chest Portable 1 View  06/28/2013   CLINICAL DATA:  Cough  EXAM: PORTABLE CHEST - 1 VIEW  COMPARISON:  05/03/2013  FINDINGS: A hiatal hernia is again identified. The cardiac shadow is stable. The lungs are well aerated bilaterally without focal infiltrate or sizable effusion.  IMPRESSION: No acute abnormality noted.   Electronically Signed   By: Inez Catalina M.D.   On: 06/28/2013 16:50    Microbiology: No results found for this or any previous visit (from the past 240 hour(s)).   Labs: Basic Metabolic Panel:  Recent Labs Lab 07/01/13 0618 07/05/13 0953 07/06/13 0620 07/07/13 0632  NA 134* 140 141 138  K 5.0 4.8 4.3 4.2  CL 95* 97 99 97  CO2 28 28 33* 33*  GLUCOSE 403* 352* 241* 258*  BUN 36* 68* 63* 49*  CREATININE 1.17* 1.74* 1.56* 1.26*  CALCIUM 8.2* 6.9* 6.7* 7.0*   Liver Function Tests:  Recent Labs Lab 07/01/13 0618  AST 31   ALT 38*  ALKPHOS 96  BILITOT 0.5  PROT 7.0  ALBUMIN 3.2*   No results found for this basename: LIPASE, AMYLASE,  in the last 168 hours No results found for this basename: AMMONIA,  in the last 168 hours CBC:  Recent Labs Lab 07/01/13 0618 07/06/13 0620 07/07/13 0632  WBC 7.7 13.9* 12.4*  HGB 8.6* 9.1* 8.9*  HCT 27.8* 28.9* 28.7*  MCV 72.8* 72.4* 72.7*  PLT 316 318 311   Cardiac Enzymes: No results found for this basename: CKTOTAL, CKMB, CKMBINDEX, TROPONINI,  in the last 168 hours BNP: BNP (last 3 results)  Recent Labs  06/28/13 1615 06/30/13 0954  PROBNP 185.1* 359.6*   CBG:  Recent Labs Lab 07/06/13 0746 07/06/13 1136 07/06/13 1621 07/06/13 2134 07/07/13 0751  GLUCAP 235* 299* 247* 274* 213*       Signed:  [name] M  [facility] 07/07/2013, 11:10 AM

## 2013-07-07 NOTE — Progress Notes (Addendum)
Inpatient Diabetes Program Recommendations  AACE/ADA: New Consensus Statement on Inpatient Glycemic Control (2013)  Target Ranges:  Prepandial:   less than 140 mg/dL      Peak postprandial:   less than 180 mg/dL (1-2 hours)      Critically ill patients:  140 - 180 mg/dL   Results for [name], [name] (MRN [id number]) as of 07/07/2013 09:46  Ref. Range 07/06/2013 11:36 07/06/2013 16:21 07/06/2013 21:34 07/07/2013 07:51  Glucose-Capillary Latest Range: 70-99 mg/dL 299 (H) 247 (H) 274 (H) 213 (H)   Inpatient Diabetes Program Recommendations Insulin - Basal: Please consider increasing Lantus to 45 untis QHS. Insulin - Meal Coverage: Please consider increasing Novolog meal coverage to 6 units TID with meals.  Note: If steroids are continued, please consider increasing Lantus to 45 units QHS and increasing Novolog meal coverage to 6 units TID with meals.  Will continue to follow.  07/07/13@14 :1 - Spoke with patient about diabetes and home regimen for diabetes control.  Patient states that she takes Metformin 1000 mg BID, Lantus 26 units QHS, and Humalog 14-18 units TID with meals for outpatient diabetes management.  Patient reports that she see Dr. Tamala Julian (Endocrinologist in [facility]) every 3-4 months.  Discussed A1C results (11.7% on 07-05-13) and patient reports that her A1C has been running higher over the past 6 months.  Patient reports that she takes steroids chronically and that over the past 6 months she has been having to take more than usual which is keeping her blood glucose elevated.  She reports that she saw Dr. Tamala Julian about one month ago and he increased her Lantus dose and provided her with a correction scale with Humalog (she use to take a set dose of Humalog with meals).  Patient appears to be very knowledgeable about diabetes and feels that the steroids are causing her blood sugars to be consistently elevated.  Stressed importance of maintaining good CBG control to prevent long-term and  short-term complications related to uncontrolled diabetes. Patient verbalized understanding of information discussed and states that she does not have any further questions at this time related to diabetes.       Thanks, Barnie Alderman, RN, MSN, CCRN Diabetes Coordinator Inpatient Diabetes Program [phone] (Team Pager) [phone] ([place] office) [phone] [facility] office)

## 2013-07-07 NOTE — Progress Notes (Signed)
Patient o2 level at Rest: 92-93% on Room Air, Ambulating 93-94% .

## 2013-07-21 ENCOUNTER — Encounter: Payer: Self-pay | Admitting: Adult Health

## 2013-07-21 ENCOUNTER — Ambulatory Visit (INDEPENDENT_AMBULATORY_CARE_PROVIDER_SITE_OTHER): Payer: Managed Care, Other (non HMO) | Admitting: Adult Health

## 2013-07-21 VITALS — BP 128/66 | HR 106 | Temp 96.7°F | Ht 59.0 in | Wt 222.0 lb

## 2013-07-21 DIAGNOSIS — J441 Chronic obstructive pulmonary disease with (acute) exacerbation: Secondary | ICD-10-CM

## 2013-07-21 MED ORDER — IPRATROPIUM-ALBUTEROL 0.5-2.5 (3) MG/3ML IN SOLN: RESPIRATORY_TRACT | Status: DC

## 2013-07-21 NOTE — Assessment & Plan Note (Signed)
Recurrent exacerbation of Chronic obstructive lung disease with asthmatic bronchitic component exacerbated by obesity and recurrent tracheobronchitis.   Plan

## 2013-07-21 NOTE — Progress Notes (Signed)
Subjective:    Patient ID: [name], female    DOB: [birth year], 67 y.o.   MRN: [id number] 67 y.o. WF Sleep apnea and Copd last seen 2008   HPI Comments:   06/02/2013 Chief Complaint  Patient presents with  . Follow-up    Last seen 07/01/2011. Pt reports she has good and bad days with her breathing. She was in the [facility] the beginning of this month d/t her breathing x 3 days. Pt has cough and brings up green phlem in the mornings and then clears throughout the day. Wheezing at times but not very much chest tx.    Not seen in two years. Since last ov still with cough, is dry Notes some wheezing . Notes some dyspnea with activity On 2L with cpap qhs and prn O2 day time Pt just started on spiriva after just got out of hosp 05/05/13.  Adm for copd exac. No pna dx. >Pepcid 40mg  at bedtime, Nexium daily , Rx  Symbicort, steroid taper, stop spiriva   07/21/2013 [facility] follow up  Patient presents for a post hospital followup. Patient was admitted November 26 through December 3 for a COPD exacerbation. Patient was treated with antibiotics, steroids, and nebulized bronchodilators. Chest x-ray showed no acute process. Patient was discharged on Levaquin and a prednisone taper. Since discharge. Patient is feeling improved with decreased cough and shortness of breath. Breathing is much better since hospital d/c.  Marland Kitchen  Feels close to baseline . Has chronic leg swelling, takes lasix daily .  She has minimal smoking hx  PFT 2012 showed FEV1 100%, ratio 72, mid flows with reversibilty , DLCO 70%.  Wears CPAP At bedtime  .  Takes symbicort Twice daily  , no missed doses.  Echo 12/2012 with nml EF , PAP 35, mild LA dilatation.   Review of Systems  Constitutional:   No  weight loss, night sweats,  Fevers, chills, + fatigue, or  lassitude.  HEENT:   No headaches,  Difficulty swallowing,  Tooth/dental problems, or  Sore throat,                No sneezing, itching, ear ache, nasal  congestion, post nasal drip,   CV:  No chest pain,  Orthopnea, PND,  , anasarca, dizziness, palpitations, syncope.   GI  No heartburn, indigestion, abdominal pain, nausea, vomiting, diarrhea, change in bowel habits, loss of appetite, bloody stools.   Resp: No chest wall deformity  Skin: no rash or lesions.  GU: no dysuria, change in color of urine, no urgency or frequency.  No flank pain, no hematuria   MS:  No joint pain or swelling.  No decreased range of motion.  No back pain.  Psych:  No change in mood or affect. No depression or anxiety.  No memory loss.          Objective:   Physical Exam   Gen: Pleasant, obese , in no distress,  normal affect  ENT: No lesions,  mouth clear,  oropharynx clear, no postnasal drip  Neck: No JVD, no TMG, no carotid bruits  Lungs: No use of accessory muscles, no dullness to percussion, no wheezing   Cardiovascular: RRR, heart sounds normal, no murmur or gallops, 1-2 + peripheral edema  Abdomen: soft and NT, no HSM,  BS normal  Musculoskeletal: No deformities, no cyanosis or clubbing  Neuro: alert, non focal  Skin: Warm, no lesions or rashes        Assessment & Plan:

## 2013-07-21 NOTE — Patient Instructions (Signed)
Continue on Symbicort 2 puffs twice daily, rinse after inhaler use. Continue on Nexium 40 mg daily before meal. Continue on Pepcid 20 mg at bedtime. May use DuoNeb nebulizer every 4-6 hours as needed. For wheezing, shortness, of breath. Follow Dr. Joya Gaskins in 4 weeks and as needed Please contact office for sooner follow up if symptoms do not improve or worsen or seek emergency care

## 2013-07-21 NOTE — Addendum Note (Signed)
Addended by: Parke Poisson E on: 07/21/2013 12:52 PM   Modules accepted: Orders

## 2013-07-27 ENCOUNTER — Encounter: Payer: Self-pay | Admitting: Gastroenterology

## 2013-07-27 ENCOUNTER — Ambulatory Visit (INDEPENDENT_AMBULATORY_CARE_PROVIDER_SITE_OTHER): Payer: Managed Care, Other (non HMO) | Admitting: Gastroenterology

## 2013-07-27 VITALS — BP 138/78 | HR 110 | Temp 97.6°F | Wt 222.6 lb

## 2013-07-27 DIAGNOSIS — K449 Diaphragmatic hernia without obstruction or gangrene: Secondary | ICD-10-CM

## 2013-07-27 DIAGNOSIS — R1319 Other dysphagia: Secondary | ICD-10-CM

## 2013-07-27 DIAGNOSIS — R1314 Dysphagia, pharyngoesophageal phase: Secondary | ICD-10-CM

## 2013-07-27 DIAGNOSIS — E538 Deficiency of other specified B group vitamins: Secondary | ICD-10-CM

## 2013-07-27 DIAGNOSIS — D509 Iron deficiency anemia, unspecified: Secondary | ICD-10-CM

## 2013-07-27 DIAGNOSIS — R195 Other fecal abnormalities: Secondary | ICD-10-CM

## 2013-07-27 DIAGNOSIS — R1310 Dysphagia, unspecified: Secondary | ICD-10-CM | POA: Insufficient documentation

## 2013-07-27 HISTORY — DX: Diaphragmatic hernia without obstruction or gangrene: K44.9

## 2013-07-27 NOTE — Progress Notes (Signed)
Please let patient know, per SLF  Schedule EGD with possible Givens capsule placement. Give phenergan 12.5mg  IV 30 minutes before due to polypharmacy.  No need for TCS at this time.   Refer to hematology for IV iron. Was seen in [place] previously.

## 2013-07-27 NOTE — Patient Instructions (Signed)
1. Please increase Nexium to one capsule before breakfast and 1 before your evening meal. 3 weeks of samples provided. If you're reflux/heartburn is greatly improved with this regimen we can write you a new prescription. 2. Hold Pepcid while taking nighttime Nexium dose. 3. Please have her blood work done when you have your family doctor's blood work done in January. 4. I will discuss your case with Dr. Oneida Alar. You will need to have an upper endoscopy and possible colonoscopy. We will call you first of next week with further instructions.

## 2013-07-27 NOTE — Progress Notes (Signed)
REVIEWED. PT NEEDS EGD WITH POSSIBLE CAPSULE PLACEMENT, DX: NEW ONSET DYSPEPSIA/OBSCURE GI BLEED/FEDA. NO NEED TO REPEAT TCS. REFER TO HEMATOLOGY FOR IVFE.

## 2013-07-27 NOTE — Progress Notes (Signed)
cc'd to pcp 

## 2013-07-27 NOTE — Progress Notes (Signed)
Primary Care Physician:  [name], MD  Primary Gastroenterologist:  Barney Drain, MD   Chief Complaint  Patient presents with  . Anemia    HPI:  [name] is a 67 y.o. female here for further evaluation of IDA.  Discharged 07/07/13 after hospitalization for COPD exacerbation. H/O IDA with extensive evaluation in 2008 and 2011 including two EGDs/two colonoscopies/SB capsule endoscopy. SB bx negative for celiac disease. No findings to explain IDA. Also with h/o B12 deficiency previously on B12 injections. She was hemoccult positive in 06/28/13.    Off B12 injections and iron infusion for several years. Previously managed by hematologist in [place], [place]. Occasional black stool, thought it was something she ate. No rectal bleeding. No constipation or diarrhea. Some abdominal pain, sharp/tearing pain in central to lower abdomen associated with diarrhea and last for 24 to 48 hours. Happens every 4 weeks. Lot of heartburn on Nexium. Recently added pepcid at bedtime with modest improvement. Worse at night and just deals with it during the day. Sleeps on 2-3 pillows and uses CPAP. Feels like food sticking, rarely has to bring it back up. Appetite fair. Sugars very hard to control recently due to steroids. Takes ASA and naproxen.  Current Outpatient Prescriptions  Medication Sig Dispense Refill  . ADVOCATE REDI-CODE test strip Use as directed      . allopurinol (ZYLOPRIM) 100 MG tablet Take 1 tablet by mouth 2 (two) times daily.      Marland Kitchen amitriptyline (ELAVIL) 100 MG tablet Take 100 mg by mouth at bedtime.        Marland Kitchen aspirin 81 MG tablet Take 81 mg by mouth daily.        Marland Kitchen atorvastatin (LIPITOR) 40 MG tablet Take 1 tablet by mouth at bedtime.       . B-D UF III MINI PEN NEEDLES 31G X 5 MM MISC Use as directed      . Blood Glucose Calibration (ADVOCATE REDI-CODE+ CONTROL) HIGH SOLN Use as directed      . budesonide-formoterol (SYMBICORT) 160-4.5 MCG/ACT inhaler Inhale 2 puffs into the lungs 2 (two)  times daily.  1 Inhaler  12  . esomeprazole (NEXIUM) 40 MG capsule Take 40 mg by mouth daily before breakfast.      . famotidine (PEPCID) 40 MG tablet Take 1 tablet (40 mg total) by mouth at bedtime.  30 tablet  6  . furosemide (LASIX) 40 MG tablet Take 40 mg by mouth daily.       Marland Kitchen HUMALOG KWIKPEN 100 UNIT/ML SOPN Inject 14-18 Units into the skin 3 (three) times daily with meals. Based on SLIDING SCALE      . HYDROcodone-acetaminophen (NORCO/VICODIN) 5-325 MG per tablet Take 1 tablet by mouth every 8 (eight) hours as needed for moderate pain or severe pain.       Marland Kitchen ipratropium-albuterol (DUONEB) 0.5-2.5 (3) MG/3ML SOLN 1 neb every 4-6 hours as needed for wheezing/shortness of breath  360 mL  5  . LANTUS SOLOSTAR 100 UNIT/ML SOPN Inject 26 Units into the skin at bedtime.       . metFORMIN (GLUCOPHAGE) 1000 MG tablet Take 1,000 mg by mouth 2 (two) times daily with a meal.      . metoprolol succinate (TOPROL-XL) 25 MG 24 hr tablet Take 50 mg by mouth daily.       . naproxen sodium (ANAPROX) 220 MG tablet Take 220-440 mg by mouth daily as needed (for pain).       . PHARMACIST CHOICE LANCETS  MISC Use as directed      . guaiFENesin-codeine 100-10 MG/5ML syrup Take 5 mLs by mouth every 6 (six) hours as needed for cough.  120 mL  0   No current facility-administered medications for this visit.    Allergies as of 07/27/2013  . (No Known Allergies)    Past Medical History  Diagnosis Date  . COPD (chronic obstructive pulmonary disease)   . Degenerative disc disease   . Vitamin B12 deficiency   . Iron deficiency anemia   . Sleep apnea   . Essential hypertension, benign   . Mixed hyperlipidemia   . Type 2 diabetes mellitus   . History of hiatal hernia   . History of diverticulitis of colon   . GERD (gastroesophageal reflux disease)   . Gout   . Neuropathy     Past Surgical History  Procedure Laterality Date  . Esophagogastroduodenoscopy  11/19/2006    SLF: Large hiatal hernia without  evidence of Cameron ulcers/. Distal esophageal stricture, which allowed the gastroscope to pass without resistance.  A 16 mm Savary later passed with mild resistance/ Normal stomach.sb bx negative  . Colonoscopy  10/01/2006    SLF:Pan colonic diverticulosis and moderate internal hemorrhoids/ Otherwise no polyps, masses, inflammatory changes or AVMs/  . Cholecystectomy    . Partial hysterectomy  1978  . Tonsillectomy and adenoidectomy    . Umbilical hernia repair  2010  . Benign breast cysts    . Two back surgeries/fusion    . Abdominal hysterectomy    . Back surgery    . Esophagogastroduodenoscopy  10/01/2006    [id number] hiatal hernia.  Distal esophagus without evidence of   erythema, ulceration or Barrett's esophagus  . Esophagogastroduodenoscopy  2011    SLF: large hh, distal esophageal web narrowing to 75mm s/p dilation to 2mm  . Colonoscopy  2011    SLF: pancolonic diverticulosis, large internal hemorrhoids  . Small bowel capsule  2008    negative    Family History  Problem Relation Age of Onset  . Colon cancer Brother   . Ulcers Sister   . Diabetes Sister   . Heart attack Sister   . Kidney failure Sister   . Stroke Sister   . Ulcers Mother   . Diabetes Mother   . Heart attack Mother   . Stroke Mother   . Asthma Mother   . Heart attack Brother   . Asthma Sister   . Diabetes Brother   . Stroke Maternal Grandmother   . Heart attack Maternal Grandmother   . Heart attack Other     History   Social History  . Marital Status: Married    Spouse Name: N/A    Number of Children: 4  . Years of Education: N/A   Occupational History  . Not on file.   Social History Main Topics  . Smoking status: Former Smoker -- 1.00 packs/day for 1 years    Types: Cigarettes    Quit date: 08/05/1961  . Smokeless tobacco: Former Systems developer    Quit date: 06/30/1962  . Alcohol Use: No  . Drug Use: No  . Sexual Activity: Not on file   Other Topics Concern  . Not on file   Social  History Narrative  . No narrative on file      ROS:  General: Negative for anorexia, weight loss, fever, chills, fatigue, weakness. Eyes: Negative for vision changes.  ENT: Negative for hoarseness, difficulty swallowing , nasal congestion. CV: Negative for chest  pain, angina, palpitations. + dyspnea on exertion, peripheral edema.  Respiratory: Negative for dyspnea at rest,  cough, sputum, wheezing. +DOE GI: See history of present illness. GU:  Negative for dysuria, hematuria, urinary incontinence, urinary frequency, nocturnal urination.  MS: Negative for joint pain, low back pain.  Derm: Negative for rash or itching.  Neuro: Negative for weakness, abnormal sensation, seizure, frequent headaches, memory loss, confusion.  Psych: Negative for anxiety, depression, suicidal ideation, hallucinations.  Endo: Negative for unusual weight change.  Heme: Negative for bruising or bleeding. Allergy: Negative for rash or hives.    Physical Examination:  BP 138/78  Pulse 110  Temp(Src) 97.6 F (36.4 C) (Oral)  Wt 222 lb 9.6 oz (100.971 kg)   General: chronically ill-appearing, obese in no acute distress.  Head: Normocephalic, atraumatic.   Eyes: Conjunctiva pink, no icterus. Mouth: Oropharyngeal mucosa moist and pink , no lesions erythema or exudate. Neck: Supple without thyromegaly, masses, or lymphadenopathy.  Lungs: Clear to auscultation bilaterally.  Heart: Regular rate and rhythm, no murmurs rubs or gallops.  Abdomen: Bowel sounds are normal, moderate epigastric tenderness, nondistended, no hepatosplenomegaly or masses, no abdominal bruits or    hernia , no rebound or guarding.   Rectal: not performed Extremities: 2+ pitting lower extremity edema. No clubbing or deformities.  Neuro: Alert and oriented x 4 , grossly normal neurologically.  Skin: Warm and dry, no rash or jaundice.   Psych: Alert and cooperative, normal mood and affect.  Labs: Lab Results  Component Value Date    WBC 12.4* 07/07/2013   HGB 8.9* 07/07/2013   HCT 28.7* 07/07/2013   MCV 72.7* 07/07/2013   PLT 311 07/07/2013   Lab Results  Component Value Date   CREATININE 1.26* 07/07/2013   BUN 49* 07/07/2013   NA 138 07/07/2013   K 4.2 07/07/2013   CL 97 07/07/2013   CO2 33* 07/07/2013   Lab Results  Component Value Date   ALT 38* 07/01/2013   AST 31 07/01/2013   ALKPHOS 96 07/01/2013   BILITOT 0.5 07/01/2013     Imaging Studies: Dg Chest 2 View  06/30/2013   CLINICAL DATA:  Worsening shortness of breath, cough, congestion.  EXAM: CHEST  2 VIEW  COMPARISON:  06/28/2013  FINDINGS: Heart is upper limits normal in size. No confluent airspace opacities or effusions. Study is AP lordotic in positioning. No acute bony abnormality.  IMPRESSION: No active cardiopulmonary disease.   Electronically Signed   By: Rolm Baptise M.D.   On: 06/30/2013 12:18   Dg Chest Portable 1 View  06/28/2013   CLINICAL DATA:  Cough  EXAM: PORTABLE CHEST - 1 VIEW  COMPARISON:  05/03/2013  FINDINGS: A hiatal hernia is again identified. The cardiac shadow is stable. The lungs are well aerated bilaterally without focal infiltrate or sizable effusion.  IMPRESSION: No acute abnormality noted.   Electronically Signed   By: Inez Catalina M.D.   On: 06/28/2013 16:50

## 2013-07-27 NOTE — Assessment & Plan Note (Signed)
Chronic IDA previously on iron infusions. Previous GI w/u in 2008/2011 without explanation for IDA. She now presents with heme + stool. Has not required blood transfusion recently. Just restarted oral iron. C/o epigastric pain, dysphagia, refractory GERD thus will need EGD/ED. I will discuss further with Dr. Oneida Alar prior to scheduling, may need to update her colonoscopy as well.  Repeat labs in 08/2013 including CBC (ordered by PCP) and iron/ferritin, B12.   For refractory GERD, increase Nexium to BID for next three weeks. Hold Pepcid. If this helps, we can right new RX for Nexium BID.

## 2013-07-28 ENCOUNTER — Other Ambulatory Visit: Payer: Self-pay | Admitting: Gastroenterology

## 2013-07-28 DIAGNOSIS — R1013 Epigastric pain: Secondary | ICD-10-CM

## 2013-07-28 DIAGNOSIS — K922 Gastrointestinal hemorrhage, unspecified: Secondary | ICD-10-CM

## 2013-07-28 NOTE — Progress Notes (Signed)
Referral to [facility] has been made

## 2013-07-28 NOTE — Progress Notes (Signed)
Patient is scheduled on Friday 08/06/13 w/SLF I have mailed her the instructions

## 2013-07-28 NOTE — Progress Notes (Signed)
Patient is aware 

## 2013-08-03 ENCOUNTER — Encounter (HOSPITAL_COMMUNITY): Payer: Self-pay | Admitting: Pharmacy Technician

## 2013-08-06 ENCOUNTER — Encounter (HOSPITAL_COMMUNITY): Payer: Self-pay | Admitting: *Deleted

## 2013-08-06 ENCOUNTER — Encounter (HOSPITAL_COMMUNITY)
Admission: RE | Disposition: A | Discharge status: Home / Self Care | Payer: Self-pay | Source: Ambulatory Visit | Attending: Gastroenterology

## 2013-08-06 ENCOUNTER — Ambulatory Visit (HOSPITAL_COMMUNITY)
Admission: RE | Admit: 2013-08-06 | Discharge: 2013-08-06 | Disposition: A | Discharge status: Home / Self Care | Payer: Managed Care, Other (non HMO) | Source: Ambulatory Visit | Attending: Gastroenterology | Admitting: Gastroenterology

## 2013-08-06 DIAGNOSIS — K2990 Gastroduodenitis, unspecified, without bleeding: Secondary | ICD-10-CM

## 2013-08-06 DIAGNOSIS — K2960 Other gastritis without bleeding: Secondary | ICD-10-CM

## 2013-08-06 DIAGNOSIS — R1310 Dysphagia, unspecified: Secondary | ICD-10-CM | POA: Insufficient documentation

## 2013-08-06 DIAGNOSIS — Z7982 Long term (current) use of aspirin: Secondary | ICD-10-CM | POA: Insufficient documentation

## 2013-08-06 DIAGNOSIS — K449 Diaphragmatic hernia without obstruction or gangrene: Secondary | ICD-10-CM | POA: Insufficient documentation

## 2013-08-06 DIAGNOSIS — K222 Esophageal obstruction: Secondary | ICD-10-CM

## 2013-08-06 DIAGNOSIS — K319 Disease of stomach and duodenum, unspecified: Secondary | ICD-10-CM | POA: Insufficient documentation

## 2013-08-06 DIAGNOSIS — K922 Gastrointestinal hemorrhage, unspecified: Secondary | ICD-10-CM

## 2013-08-06 DIAGNOSIS — Z794 Long term (current) use of insulin: Secondary | ICD-10-CM | POA: Insufficient documentation

## 2013-08-06 DIAGNOSIS — K2970 Gastritis, unspecified, without bleeding: Secondary | ICD-10-CM | POA: Insufficient documentation

## 2013-08-06 DIAGNOSIS — Z01812 Encounter for preprocedural laboratory examination: Secondary | ICD-10-CM | POA: Insufficient documentation

## 2013-08-06 DIAGNOSIS — D649 Anemia, unspecified: Secondary | ICD-10-CM

## 2013-08-06 DIAGNOSIS — J449 Chronic obstructive pulmonary disease, unspecified: Secondary | ICD-10-CM | POA: Insufficient documentation

## 2013-08-06 DIAGNOSIS — R1013 Epigastric pain: Secondary | ICD-10-CM

## 2013-08-06 DIAGNOSIS — I10 Essential (primary) hypertension: Secondary | ICD-10-CM | POA: Insufficient documentation

## 2013-08-06 DIAGNOSIS — J4489 Other specified chronic obstructive pulmonary disease: Secondary | ICD-10-CM | POA: Insufficient documentation

## 2013-08-06 DIAGNOSIS — E119 Type 2 diabetes mellitus without complications: Secondary | ICD-10-CM | POA: Insufficient documentation

## 2013-08-06 HISTORY — PX: GIVENS CAPSULE STUDY: SHX5432

## 2013-08-06 HISTORY — PX: ESOPHAGOGASTRODUODENOSCOPY: SHX5428

## 2013-08-06 LAB — GLUCOSE, CAPILLARY: GLUCOSE-CAPILLARY: 155 mg/dL — AB (ref 70–99)

## 2013-08-06 SURGERY — EGD (ESOPHAGOGASTRODUODENOSCOPY)
Anesthesia: Moderate Sedation

## 2013-08-06 MED ORDER — MIDAZOLAM HCL 5 MG/5ML IJ SOLN
INTRAMUSCULAR | Status: AC
  Filled 2013-08-06: qty 10

## 2013-08-06 MED ORDER — MINERAL OIL PO OIL
TOPICAL_OIL | ORAL | Status: AC
  Filled 2013-08-06: qty 30

## 2013-08-06 MED ORDER — PROMETHAZINE HCL 25 MG/ML IJ SOLN
INTRAMUSCULAR | Status: AC
  Filled 2013-08-06: qty 1

## 2013-08-06 MED ORDER — SODIUM CHLORIDE 0.9 % IJ SOLN
INTRAMUSCULAR | Status: AC
  Filled 2013-08-06: qty 10

## 2013-08-06 MED ORDER — FAMOTIDINE 40 MG PO TABS: 40.0000 mg | ORAL_TABLET | Freq: Every day | ORAL | Status: DC

## 2013-08-06 MED ORDER — PROMETHAZINE HCL 25 MG/ML IJ SOLN
12.5000 mg | Freq: Once | INTRAMUSCULAR | Status: AC
  Administered 2013-08-06: 12.5 mg via INTRAVENOUS

## 2013-08-06 MED ORDER — SODIUM CHLORIDE 0.9 % IV SOLN
INTRAVENOUS | Status: DC
  Administered 2013-08-06: 08:00:00 via INTRAVENOUS

## 2013-08-06 MED ORDER — MEPERIDINE HCL 100 MG/ML IJ SOLN
INTRAMUSCULAR | Status: AC
  Filled 2013-08-06: qty 2

## 2013-08-06 MED ORDER — MIDAZOLAM HCL 5 MG/5ML IJ SOLN
INTRAMUSCULAR | Status: DC | PRN
  Administered 2013-08-06 (×2): 2 mg via INTRAVENOUS
  Administered 2013-08-06 (×2): 1 mg via INTRAVENOUS

## 2013-08-06 MED ORDER — BUTAMBEN-TETRACAINE-BENZOCAINE 2-2-14 % EX AERO
INHALATION_SPRAY | CUTANEOUS | Status: DC | PRN
  Administered 2013-08-06: 2 via TOPICAL

## 2013-08-06 MED ORDER — ESOMEPRAZOLE MAGNESIUM 40 MG PO CPDR: DELAYED_RELEASE_CAPSULE | ORAL | Status: DC

## 2013-08-06 MED ORDER — MEPERIDINE HCL 100 MG/ML IJ SOLN
INTRAMUSCULAR | Status: DC | PRN
  Administered 2013-08-06 (×2): 25 mg via INTRAVENOUS

## 2013-08-06 NOTE — Op Note (Signed)
[place], [id number]   ENDOSCOPY PROCEDURE REPORT  PATIENT: [name], [name].  MR#: [id number] BIRTHDATE: 1946/05/27 , 67  yrs. old GENDER: Female  ENDOSCOPIST: Barney Drain, MD REFFERED [id number] Tapper, M.D.  PROCEDURE DATE:  08/06/2013 PROCEDURE:   EGD with biopsy & with dilatation over guidewire , GIVENS CAPSULE PLACEMENT  INDICATIONS:1.  anemia.   2.  dysphagia. MEDICATIONS: Demerol 50 mg IV and Versed 6 mg IV TOPICAL ANESTHETIC: Cetacaine Spray  DESCRIPTION OF PROCEDURE:   After the risks benefits and alternatives of the procedure were thoroughly explained, informed consent was obtained.  The [id number]  endoscope was introduced through the mouth and advanced to the second portion of the duodenum. The instrument was slowly withdrawn as the mucosa was carefully examined.  Prior to withdrawal of the scope, the guidwire was placed.  The esophagus was dilated successfully.  The patient was recovered in endoscopy and discharged home in satisfactory condition.    GIVENS CAPSULE PLACED AFTER 3 ATTEMPTS. SML AMOUNT OF RED BLOOD FROM TRAUM SEEN DURING CAPSULE PLACEMENT. CAPSULE RELEASED INTO THESECOND PORTION OF THE DUODENUM. BIOPSIES AND DILATION FOLLOWED. ESOPHAGUS: A stricture was found at the gastroesophageal junction. The stenosis was traversable with the endoscope.   STOMACH: LARGE HITAL HERNIA. Mild erosive gastritis (inflammation) was found in the gastric fundus and gastric antrum.  Multiple biopsies were performed using cold forceps.   DUODENUM: The duodenal mucosa showed no abnormalities in the bulb and second portion of the duodenum.   Dilation was then performed at the gastroesphageal junction Dilator: Savary over guidewire Size(s): 14-16 MM Resistance: minimal  COMPLICATIONS: There were no complications.  ENDOSCOPIC IMPRESSION: 1.   Stricture at the gastroesophageal junction 2.   LARGE HIATAL HERNIA 3.    MILDErosive gastritis  RECOMMENDATIONS: INCREASE NEXIUM.  TAKE 30 MINUTES BEFORE MEALS TWICE DAILY. PEPCID HELPS MOST WHEN USED AS NEEDED. FOLLOW A LOW FAT/DIABETIC DIET.  INFO GIVEN ON A LOW FAT DIET. RETURN CAPSULE RECORDER THIS AFTERNOON. FOLLOW UP APPT IN 3 MONTHS.      _______________________________ Lorrin MaisBarney Drain, MD 08/06/2013 10:12 AM      PATIENT NAME:  [name] MR#: [id number]

## 2013-08-06 NOTE — OR Nursing (Addendum)
Pill cam dropped at [place] lot [phone]

## 2013-08-06 NOTE — Discharge Instructions (Signed)
I STRETCHED your esophagus. You have a small hiatal hernia. You have gastritis. I biopsied your stomach. I PLACED THE GIVENS CAPSULE IN YOUR SMALL BOWEL.   STOP USING NAPROXEN. IT IS IRRITATING YOUR STOMACH. CONTINUE ASPIRIN. USE TYLENOL AS NEEDED FOR PAIN.  CONTINUE NEXIUM. TAKE 30 MINUTES BEFORE MEALS TWICE DAILY.  PEPCID HELPS MOST WHEN USED AS NEEDED.  FOLLOW A LOW FAT/DIABETIC DIET. SEE INFO BELOW ON A LOW FAT DIET.  RETURN CAPSULE RECORDER THIS AFTERNOON AT 530 PM TO THE ED ENTRANCE.  YOUR BIOPSY WILL BE BACK IN 7 DAYS  FOLLOW UP APPT IN 3 MONTHS.    UPPER ENDOSCOPY AFTER CARE Read the instructions outlined below and refer to this sheet in the next week. These discharge instructions provide you with general information on caring for yourself after you leave the hospital. While your treatment has been planned according to the most current medical practices available, unavoidable complications occasionally occur. If you have any problems or questions after discharge, call DR. Denelle Capurro, [phone].  ACTIVITY  You may resume your regular activity, but move at a slower pace for the next 24 hours.   Take frequent rest periods for the next 24 hours.   Walking will help get rid of the air and reduce the bloated feeling in your belly (abdomen).   No driving for 24 hours (because of the medicine (anesthesia) used during the test).   You may shower.   Do not sign any important legal documents or operate any machinery for 24 hours (because of the anesthesia used during the test).    NUTRITION  Drink plenty of fluids.   You may resume your normal diet as instructed by your doctor.   Begin with a light meal and progress to your normal diet. Heavy or fried foods are harder to digest and may make you feel sick to your stomach (nauseated).   Avoid alcoholic beverages for 24 hours or as instructed.    MEDICATIONS  You may resume your normal medications.   WHAT YOU CAN EXPECT  TODAY  Some feelings of bloating in the abdomen.   Passage of more gas than usual.    IF YOU HAD A BIOPSY TAKEN DURING THE UPPER ENDOSCOPY:  Eat a soft diet IF YOU HAVE NAUSEA, BLOATING, ABDOMINAL PAIN, OR VOMITING.    FINDING OUT THE RESULTS OF YOUR TEST Not all test results are available during your visit. DR. Oneida Alar WILL CALL YOU WITHIN 7 DAYS OF YOUR PROCEDUE WITH YOUR RESULTS. Do not assume everything is normal if you have not heard from DR. Emmaleigh Longo IN ONE WEEK, CALL HER OFFICE AT [phone].  SEEK IMMEDIATE MEDICAL ATTENTION AND CALL THE OFFICE: [phone] IF:  You have more than a spotting of blood in your stool.   Your belly is swollen (abdominal distention).   You are nauseated or vomiting.   You have a temperature over 101F.   You have abdominal pain or discomfort that is severe or gets worse throughout the day.   Gastritis  Gastritis is an inflammation (the body's way of reacting to injury and/or infection) of the stomach. It is often caused by bacterial (germ) infections. It can also be caused BY ASPIRIN, BC/GOODY POWDER'S, (IBUPROFEN) MOTRIN, OR ALEVE (NAPROXEN), chemicals (including alcohol), SPICY FOODS, and medications. This illness may be associated with generalized malaise (feeling tired, not well), UPPER ABDOMINAL STOMACH cramps, and fever. One common bacterial cause of gastritis is an organism known as H. Pylori. This can be treated with antibiotics.  Hiatal Hernia A hiatal hernia occurs when a part of the stomach slides above the diaphragm. The diaphragm is the thin muscle separating the belly (abdomen) from the chest. A hiatal hernia can be something you are born with or develop over time. Hiatal hernias may allow stomach acid to flow back into your esophagus, the tube which carries food from your mouth to your stomach. If this acid causes problems it is called GERD (gastro-esophageal reflux disease).   SYMPTOMS Common symptoms of GERD are heartburn  (burning in your chest). This is worse when lying down or bending over. It may also cause belching and indigestion. Some of the things which make GERD worse are:  Increased weight pushes on stomach making acid rise more easily.   Smoking markedly increases acid production.   Alcohol decreases lower esophageal sphincter pressure (valve between stomach and esophagus), allowing acid from stomach into esophagus.   Late evening meals and going to bed with a full stomach increases pressure.   Anything that causes an increase in acid production.    HOME CARE INSTRUCTIONS  Try to achieve and maintain an ideal body weight.   Avoid drinking alcoholic beverages.   DO NOT smokE.   Do not wear tight clothing around your chest or stomach.   Eat smaller meals and eat more frequently. This keeps your stomach from getting too full. Eat slowly.   Do not lie down for 2 or 3 hours after eating. Do not eat or drink anything 1 to 2 hours before going to bed.   Avoid caffeine beverages (colas, coffee, cocoa, tea), fatty foods, citrus fruits and all other foods and drinks that contain acid and that seem to increase the problems.   Avoid bending over, especially after eating OR STRAINING. Anything that increases the pressure in your belly increases the amount of acid that may be pushed up into your esophagus.    ESOPHAGEAL STRICTURE  Esophageal strictures can be caused by stomach acid backing up into the tube that carries food from the mouth down to the stomach (lower esophagus).  TREATMENT There are a number of non-prescription medicines used to treat reflux/stricture, including: Antacids.  ZANTAC Proton-pump inhibitors: NEXIUM   HOME CARE INSTRUCTIONS Eat 2-3 hours before going to bed.  Try to reach and maintain a healthy weight.  Do not eat just a few very large meals. Instead, eat 4 TO 6 smaller meals throughout the day.  Try to identify foods and beverages that make your symptoms worse, and  avoid these.  Avoid tight clothing.  Do not exercise right after eating.   Low-Fat Diet BREADS, CEREALS, PASTA, RICE, DRIED PEAS, AND BEANS These products are high in carbohydrates and most are low in fat. Therefore, they can be increased in the diet as substitutes for fatty foods. They too, however, contain calories and should not be eaten in excess. Cereals can be eaten for snacks as well as for breakfast.   FRUITS AND VEGETABLES It is good to eat fruits and vegetables. Besides being sources of fiber, both are rich in vitamins and some minerals. They help you get the daily allowances of these nutrients. Fruits and vegetables can be used for snacks and desserts.  MEATS Limit lean meat, chicken, [place], and fish to no more than 6 ounces per day. Beef, Pork, and Lamb Use lean cuts of beef, pork, and lamb. Lean cuts include:  Extra-lean ground beef.  Arm roast.  Sirloin tip.  Center-cut ham.  Round steak.  Loin chops.  Rump roast.  Tenderloin.  Trim all fat off the outside of meats before cooking. It is not necessary to severely decrease the intake of red meat, but lean choices should be made. Lean meat is rich in protein and contains a highly absorbable form of iron. Premenopausal women, in particular, should avoid reducing lean red meat because this could increase the risk for low red blood cells (iron-deficiency anemia).  Chicken and [place] These are good sources of protein. The fat of poultry can be reduced by removing the skin and underlying fat layers before cooking. Chicken and [place] can be substituted for lean red meat in the diet. Poultry should not be fried or covered with high-fat sauces. Fish and Shellfish Fish is a good source of protein. Shellfish contain cholesterol, but they usually are low in saturated fatty acids. The preparation of fish is important. Like chicken and [place], they should not be fried or covered with high-fat sauces. EGGS Egg whites contain no fat or  cholesterol. They can be eaten often. Try 1 to 2 egg whites instead of whole eggs in recipes or use egg substitutes that do not contain yolk. MILK AND DAIRY PRODUCTS Use skim or 1% milk instead of 2% or whole milk. Decrease whole milk, natural, and processed cheeses. Use nonfat or low-fat (2%) cottage cheese or low-fat cheeses made from vegetable oils. Choose nonfat or low-fat (1 to 2%) yogurt. Experiment with evaporated skim milk in recipes that call for heavy cream. Substitute low-fat yogurt or low-fat cottage cheese for sour cream in dips and salad dressings. Have at least 2 servings of low-fat dairy products, such as 2 glasses of skim (or 1%) milk each day to help get your daily calcium intake. FATS AND OILS Reduce the total intake of fats, especially saturated fat. Butterfat, lard, and beef fats are high in saturated fat and cholesterol. These should be avoided as much as possible. Vegetable fats do not contain cholesterol, but certain vegetable fats, such as coconut oil, palm oil, and palm kernel oil are very high in saturated fats. These should be limited. These fats are often used in bakery goods, processed foods, popcorn, oils, and nondairy creamers. Vegetable shortenings and some peanut butters contain hydrogenated oils, which are also saturated fats. Read the labels on these foods and check for saturated vegetable oils. Unsaturated vegetable oils and fats do not raise blood cholesterol. However, they should be limited because they are fats and are high in calories. Total fat should still be limited to 30% of your daily caloric intake. Desirable liquid vegetable oils are corn oil, cottonseed oil, olive oil, canola oil, safflower oil, soybean oil, and sunflower oil. Peanut oil is not as good, but small amounts are acceptable. Buy a heart-healthy tub margarine that has no partially hydrogenated oils in the ingredients. Mayonnaise and salad dressings often are made from unsaturated fats, but they should  also be limited because of their high calorie and fat content. Seeds, nuts, peanut butter, olives, and avocados are high in fat, but the fat is mainly the unsaturated type. These foods should be limited mainly to avoid excess calories and fat. OTHER EATING TIPS Snacks  Most sweets should be limited as snacks. They tend to be rich in calories and fats, and their caloric content outweighs their nutritional value. Some good choices in snacks are graham crackers, melba toast, soda crackers, bagels (no egg), English muffins, fruits, and vegetables. These snacks are preferable to snack crackers, [place] fries, TORTILLA CHIPS, and POTATO chips. Popcorn  should be air-popped or cooked in small amounts of liquid vegetable oil. Desserts Eat fruit, low-fat yogurt, and fruit ices instead of pastries, cake, and cookies. Sherbet, angel food cake, gelatin dessert, frozen low-fat yogurt, or other frozen products that do not contain saturated fat (pure fruit juice bars, frozen ice pops) are also acceptable.  COOKING METHODS Choose those methods that use little or no fat. They include: Poaching.  Braising.  Steaming.  Grilling.  Baking.  Stir-frying.  Broiling.  Microwaving.  Foods can be cooked in a nonstick pan without added fat, or use a nonfat cooking spray in regular cookware. Limit fried foods and avoid frying in saturated fat. Add moisture to lean meats by using water, broth, cooking wines, and other nonfat or low-fat sauces along with the cooking methods mentioned above. Soups and stews should be chilled after cooking. The fat that forms on top after a few hours in the refrigerator should be skimmed off. When preparing meals, avoid using excess salt. Salt can contribute to raising blood pressure in some people.  EATING AWAY FROM HOME Order entres, potatoes, and vegetables without sauces or butter. When meat exceeds the size of a deck of cards (3 to 4 ounces), the rest can be taken home for another  meal. Choose vegetable or fruit salads and ask for low-calorie salad dressings to be served on the side. Use dressings sparingly. Limit high-fat toppings, such as bacon, crumbled eggs, cheese, sunflower seeds, and olives. Ask for heart-healthy tub margarine instead of butter.

## 2013-08-06 NOTE — H&P (Signed)
Primary Care Physician:  [name], MD Primary Gastroenterologist:  Dr. Oneida Alar  Pre-Procedure History & Physical: HPI:  [name] is a 68 y.o. female here for NEW ONSET DYSPEPSIA/OBSCURE GI BLEED/FEDA.  Past Medical History  Diagnosis Date  . COPD (chronic obstructive pulmonary disease)   . Degenerative disc disease   . Vitamin B12 deficiency   . Iron deficiency anemia   . Sleep apnea   . Essential hypertension, benign   . Mixed hyperlipidemia   . Type 2 diabetes mellitus   . History of hiatal hernia   . History of diverticulitis of colon   . GERD (gastroesophageal reflux disease)   . Gout   . Neuropathy     Past Surgical History  Procedure Laterality Date  . Esophagogastroduodenoscopy  11/19/2006    SLF: Large hiatal hernia without evidence of Cameron ulcers/. Distal esophageal stricture, which allowed the gastroscope to pass without resistance.  A 16 mm Savary later passed with mild resistance/ Normal stomach.sb bx negative  . Colonoscopy  10/01/2006    SLF:Pan colonic diverticulosis and moderate internal hemorrhoids/ Otherwise no polyps, masses, inflammatory changes or AVMs/  . Cholecystectomy    . Partial hysterectomy  1978  . Tonsillectomy and adenoidectomy    . Umbilical hernia repair  2010  . Benign breast cysts    . Two back surgeries/fusion    . Abdominal hysterectomy    . Back surgery    . Esophagogastroduodenoscopy  10/01/2006    [id number] hiatal hernia.  Distal esophagus without evidence of   erythema, ulceration or Barrett's esophagus  . Esophagogastroduodenoscopy  2011    SLF: large hh, distal esophageal web narrowing to 13mm s/p dilation to 58mm  . Colonoscopy  2011    SLF: pancolonic diverticulosis, large internal hemorrhoids  . Small bowel capsule  2008    negative    Prior to Admission medications   Medication Sig Start Date End Date Taking? Authorizing Provider  allopurinol (ZYLOPRIM) 100 MG tablet Take 1 tablet by mouth 2 (two)  times daily. 10/09/12  Yes Historical Provider, MD  amitriptyline (ELAVIL) 100 MG tablet Take 100 mg by mouth at bedtime.     Yes Historical Provider, MD  aspirin 81 MG tablet Take 81 mg by mouth daily.     Yes Historical Provider, MD  atorvastatin (LIPITOR) 40 MG tablet Take 1 tablet by mouth at bedtime.  10/09/12  Yes Historical Provider, MD  budesonide-formoterol (SYMBICORT) 160-4.5 MCG/ACT inhaler Inhale 2 puffs into the lungs 2 (two) times daily. 06/01/13  Yes Elsie Stain, MD  esomeprazole (NEXIUM) 40 MG capsule Take 40 mg by mouth daily before breakfast. 05/14/11  Yes Elsie Stain, MD  famotidine (PEPCID) 40 MG tablet Take 1 tablet (40 mg total) by mouth at bedtime. 06/01/13  Yes Elsie Stain, MD  furosemide (LASIX) 40 MG tablet Take 40 mg by mouth daily.    Yes Historical Provider, MD  HUMALOG KWIKPEN 100 UNIT/ML SOPN Inject 14-18 Units into the skin 3 (three) times daily with meals. Based on SLIDING SCALE 10/06/12  Yes Historical Provider, MD  HYDROcodone-acetaminophen (NORCO/VICODIN) 5-325 MG per tablet Take 1 tablet by mouth every 8 (eight) hours as needed for moderate pain or severe pain.  03/26/13  Yes Historical Provider, MD  ipratropium-albuterol (DUONEB) 0.5-2.5 (3) MG/3ML SOLN 1 neb every 4-6 hours as needed for wheezing/shortness of breath 07/21/13  Yes Tammy S Parrett, NP  LANTUS SOLOSTAR 100 UNIT/ML SOPN Inject 26 Units into the skin at bedtime.  10/06/12  Yes Historical Provider, MD  metFORMIN (GLUCOPHAGE) 1000 MG tablet Take 1,000 mg by mouth 2 (two) times daily with a meal.   Yes Historical Provider, MD  metoprolol succinate (TOPROL-XL) 25 MG 24 hr tablet Take 50 mg by mouth daily.    Yes Historical Provider, MD  naproxen sodium (ANAPROX) 220 MG tablet Take 220-440 mg by mouth daily as needed (for pain).    Yes Historical Provider, MD  ADVOCATE REDI-CODE test strip Use as directed    Historical Provider, MD  B-D UF III MINI PEN NEEDLES 31G X 5 MM MISC Use as directed     Historical Provider, MD  Blood Glucose Calibration (ADVOCATE REDI-CODE+ CONTROL) HIGH SOLN Use as directed    Historical Provider, MD  PHARMACIST CHOICE LANCETS MISC Use as directed    Historical Provider, MD    Allergies as of 07/28/2013  . (No Known Allergies)    Family History  Problem Relation Age of Onset  . Colon cancer Brother   . Ulcers Sister   . Diabetes Sister   . Heart attack Sister   . Kidney failure Sister   . Stroke Sister   . Ulcers Mother   . Diabetes Mother   . Heart attack Mother   . Stroke Mother   . Asthma Mother   . Heart attack Brother   . Asthma Sister   . Diabetes Brother   . Stroke Maternal Grandmother   . Heart attack Maternal Grandmother   . Heart attack Other     History   Social History  . Marital Status: Married    Spouse Name: N/A    Number of Children: 4  . Years of Education: N/A   Occupational History  . Not on file.   Social History Main Topics  . Smoking status: Former Smoker -- 1.00 packs/day for 1 years    Types: Cigarettes    Quit date: 08/05/1961  . Smokeless tobacco: Former Systems developer    Quit date: 06/30/1962  . Alcohol Use: No  . Drug Use: No  . Sexual Activity: Not on file   Other Topics Concern  . Not on file   Social History Narrative  . No narrative on file    Review of Systems: See HPI, otherwise negative ROS   Physical Exam: BP 155/76  Pulse 112  Temp(Src) 98 F (36.7 C) (Oral)  Resp 20  SpO2 92% General:   Alert,  pleasant and cooperative in NAD Head:  Normocephalic and atraumatic. Neck:  Supple; Lungs:  Clear throughout to auscultation.    Heart:  Regular rate and rhythm. Abdomen:  Soft, nontender and nondistended. Normal bowel sounds, without guarding, and without rebound.   Neurologic:  Alert and  oriented x4;  grossly normal neurologically.  Impression/Plan:    NEW ONSET DYSPEPSIA/OBSCURE GI BLEED/FEDA.  Plan: 1. Egd/?Givens today

## 2013-08-09 ENCOUNTER — Encounter (HOSPITAL_COMMUNITY): Payer: Self-pay | Admitting: Gastroenterology

## 2013-08-09 ENCOUNTER — Telehealth: Payer: Self-pay | Admitting: Gastroenterology

## 2013-08-09 ENCOUNTER — Other Ambulatory Visit: Payer: Self-pay | Admitting: Gastroenterology

## 2013-08-09 DIAGNOSIS — D509 Iron deficiency anemia, unspecified: Secondary | ICD-10-CM

## 2013-08-09 DIAGNOSIS — E538 Deficiency of other specified B group vitamins: Secondary | ICD-10-CM

## 2013-08-09 DIAGNOSIS — D649 Anemia, unspecified: Secondary | ICD-10-CM

## 2013-08-09 NOTE — Telephone Encounter (Signed)
PLEASE CALL PT.  HER CAPSULE STUDY SHOWS OCCASIONAL SMALL BOWEL ULCERS. THE STUDY WAS INCOMPLETE BUT THE CAPSULE APPEARS TO MAKE IT TO THE LAST PART OF THE SMALL INTESTINES. HER LOW BLOOD COUNT IS MOST LIKELY RELATED TO EROSIVE GASTRITIS AND ASPIRIN CAUSING SMALL BOWEL ULCERS AS WELL AS LOW KIDNEY FUNCTION. EXACERBATED BY CHRONIC RENAL INSUFFICIENCY. SHE SHOULD:  1. SEE HEMATOLOGY FOR IV FE AND B12 INJECTIONS 2. STOP TAKING ASPIRIN. 3. OPV IN 4 MOS E30 ANEMIA W/ LL OR SLF.

## 2013-08-09 NOTE — Telephone Encounter (Signed)
Referral has been made to [facility]  

## 2013-08-09 NOTE — Procedures (Signed)
PMHx: IDA with extensive evaluation in 2008 and 2011 including two EGDs/two colonoscopies/SB capsule endoscopy. SB bx negative for celiac disease. EGD JAN 2015 SHOWS LARGE HIATAL HERNIA WITH LINEAR EROSIONS,  Jul 07 2013- MICROCYTIC ANEMIA(HB 8.9 MCV 72) OFF IVFE AND PARENTERAL B12, Cr 1.26.  PATIENT DATA: WEIGHT:  220 LBS  WAIST:  LARGE     HEIGHT:  49 IN   GASTRIC PASSAGE TIME: 0 m, SB PASSAGE TIME: 7H 81m UNABLE TO CONFIRM CAPSULE REACHED CECUM.  RESULTS: LIMITED views of gastric mucosa due to CAPSULE PLACED DURING EGD.  OCCASIONAL SML ULCERS IN SMALL BOWEL. NO masses or AVMs seen.  No old blood or fresh blood in the small bowel.  DIAGNOSIS: ANEMIA MOST LIKELY RELATED TO EROSIVE GASTRITIS AND NSAID INDUCED SMALL BOWEL ENTEROPATHY AND EXACERBATED BY CHRONIC RENAL INSUFFICIENCY.  Plan: 1. HEMATOLOGY REFERRAL FOR IV FE AND B12 INJECTIONS 2. STOP ASPIRIN 3. OPV IN 4 MOS

## 2013-08-09 NOTE — Telephone Encounter (Signed)
Pt is aware of results and referral.

## 2013-08-10 NOTE — Telephone Encounter (Signed)
Pt returned call and was informed of results.  

## 2013-08-10 NOTE — Telephone Encounter (Signed)
Reminder in epic °

## 2013-08-10 NOTE — Telephone Encounter (Signed)
Please call pt. HER stomach Bx shows gastritis.   STOP USING NAPROXEN & ASPIRIN. USE TYLENOL AS NEEDED FOR PAIN. CONTINUE NEXIUM. TAKE 30 MINUTES BEFORE MEALS TWICE DAILY.  PEPCID HELPS MOST WHEN USED AS NEEDED. FOLLOW A LOW FAT/DIABETIC DIET.   FOLLOW UP APPT IN MAY 2015.

## 2013-08-10 NOTE — Telephone Encounter (Signed)
LMOM for a return call.  

## 2013-08-10 NOTE — Telephone Encounter (Signed)
Patient is going to Hematology in [place]

## 2013-08-16 LAB — IRON AND TIBC
%SAT: 12 % — ABNORMAL LOW (ref 20–55)
Iron: 40 ug/dL — ABNORMAL LOW (ref 42–145)
TIBC: 329 ug/dL (ref 250–470)
UIBC: 289 ug/dL (ref 125–400)

## 2013-08-16 LAB — FERRITIN: Ferritin: 15 ng/mL (ref 10–291)

## 2013-08-16 LAB — VITAMIN B12: Vitamin B-12: 242 pg/mL (ref 211–911)

## 2013-08-18 DIAGNOSIS — D649 Anemia, unspecified: Secondary | ICD-10-CM | POA: Insufficient documentation

## 2013-08-18 DIAGNOSIS — K148 Other diseases of tongue: Secondary | ICD-10-CM | POA: Insufficient documentation

## 2013-08-18 NOTE — Progress Notes (Signed)
Quick Note:  Pt is aware. She has not had any labs done at PCP. She did see Dr. Tressie Stalker today. ______

## 2013-08-23 ENCOUNTER — Ambulatory Visit (INDEPENDENT_AMBULATORY_CARE_PROVIDER_SITE_OTHER): Payer: Managed Care, Other (non HMO) | Admitting: Critical Care Medicine

## 2013-08-23 ENCOUNTER — Encounter (INDEPENDENT_AMBULATORY_CARE_PROVIDER_SITE_OTHER): Payer: Self-pay

## 2013-08-23 ENCOUNTER — Encounter: Payer: Self-pay | Admitting: Critical Care Medicine

## 2013-08-23 VITALS — BP 128/84 | HR 92 | Temp 98.1°F | Ht 59.0 in | Wt 222.0 lb

## 2013-08-23 DIAGNOSIS — J449 Chronic obstructive pulmonary disease, unspecified: Secondary | ICD-10-CM

## 2013-08-23 MED ORDER — BUDESONIDE-FORMOTEROL FUMARATE 160-4.5 MCG/ACT IN AERO: 2.0000 | INHALATION_SPRAY | Freq: Two times a day (BID) | RESPIRATORY_TRACT | Status: DC

## 2013-08-23 NOTE — Progress Notes (Signed)
Subjective:    Patient ID: [name], female    DOB: [birth year], 68 y.o.   MRN: [id number]  HPI 68 y.o. WF Sleep apnea and Copd last seen 2008  07/21/13 [facility] follow up  Patient presents for a post hospital followup. Patient was admitted November 26 through December 3 for a COPD exacerbation. Patient was treated with antibiotics, steroids, and nebulized bronchodilators. Chest x-ray showed no acute process. Patient was discharged on Levaquin and a prednisone taper. Since discharge. Patient is feeling improved with decreased cough and shortness of breath. Breathing is much better since hospital d/c.  Marland Kitchen  Feels close to baseline . Has chronic leg swelling, takes lasix daily .  She has minimal smoking hx  PFT 2012 showed FEV1 100%, ratio 72, mid flows with reversibilty , DLCO 70%.  Wears CPAP At bedtime  .  Takes symbicort Twice daily  , no missed doses.  Echo 12/2012 with nml EF , PAP 35, mild LA dilatation.  08/23/2013 Chief Complaint  Patient presents with  . Follow-up    Breathing has improved.  Does have DOE, some wheezing, and cough - prod at times with green mucus in the mornings.  No chest tightness/pain or f/c/s.  No flare ups since adm during thanksgiving.  Now off pred/abx Pt denies any significant sore throat, nasal congestion or excess secretions, fever, chills, sweats, unintended weight loss, pleurtic or exertional chest pain, orthopnea PND, or leg swelling Pt denies any increase in rescue therapy over baseline, denies waking up needing it or having any early am or nocturnal exacerbations of coughing/wheezing/or dyspnea. Pt also denies any obvious fluctuation in symptoms with  weather or environmental change or other alleviating or aggravating factors    Review of Systems Constitutional:   No  weight loss, night sweats,  Fevers, chills, fatigue, lassitude. HEENT:   No headaches,  Difficulty swallowing,  Tooth/dental problems,  Sore throat,                No  sneezing, itching, ear ache, nasal congestion, post nasal drip,   CV:  No chest pain,  Orthopnea, PND, swelling in lower extremities, anasarca, dizziness, palpitations  GI  No heartburn, indigestion, abdominal pain, nausea, vomiting, diarrhea, change in bowel habits, loss of appetite  Resp: No shortness of breath with exertion or at rest.  No excess mucus, no productive cough,  No non-productive cough,  No coughing up of blood.  No change in color of mucus.  No wheezing.  No chest wall deformity  Skin: no rash or lesions.  GU: no dysuria, change in color of urine, no urgency or frequency.  No flank pain.  MS:  No joint pain or swelling.  No decreased range of motion.  No back pain.  Psych:  No change in mood or affect. No depression or anxiety.  No memory loss.     Objective:   Physical Exam Filed Vitals:   08/23/13 1505  BP: 128/84  Pulse: 92  Temp: 98.1 F (36.7 C)  TempSrc: Oral  Height: 4\' 11"  (1.499 m)  Weight: 222 lb (100.699 kg)  SpO2: 96%    Gen: Pleasant, morbidly obese, in no distress,  normal affect  ENT: No lesions,  mouth clear,  oropharynx clear, no postnasal drip  Neck: No JVD, no TMG, no carotid bruits  Lungs: No use of accessory muscles, no dullness to percussion, clear without rales or rhonchi  Cardiovascular: RRR, heart sounds normal, no murmur or gallops, no peripheral edema  Abdomen: soft and NT, no HSM,  BS normal  Musculoskeletal: No deformities, no cyanosis or clubbing  Neuro: alert, non focal  Skin: Warm, no lesions or rashes  No results found.        Assessment & Plan:   Obstructive chronic bronchitis without exacerbation Chronic obstructive lung disease with asthmatic bronchitic component with reversible degree of airflow obstruction. Note baseline pulmonary function show small airway obstruction with 100% predicted FEV1  Plan Maintain inhaled medications as prescribed Note on exertional oxygen testing the patient did not  qualify for home oxygen therapy    Updated Medication List Outpatient Encounter Prescriptions as of 08/23/2013  Medication Sig  . ADVOCATE REDI-CODE test strip Use as directed  . allopurinol (ZYLOPRIM) 100 MG tablet Take 1 tablet by mouth 2 (two) times daily.  Marland Kitchen amitriptyline (ELAVIL) 100 MG tablet Take 100 mg by mouth at bedtime.    Marland Kitchen aspirin 81 MG tablet Take 81 mg by mouth daily.    Marland Kitchen atorvastatin (LIPITOR) 40 MG tablet Take 1 tablet by mouth at bedtime.   . B-D UF III MINI PEN NEEDLES 31G X 5 MM MISC Use as directed  . Blood Glucose Calibration (ADVOCATE REDI-CODE+ CONTROL) HIGH SOLN Use as directed  . budesonide-formoterol (SYMBICORT) 160-4.5 MCG/ACT inhaler Inhale 2 puffs into the lungs 2 (two) times daily.  Marland Kitchen CHERATUSSIN AC 100-10 MG/5ML syrup as needed.  Marland Kitchen esomeprazole (NEXIUM) 40 MG capsule 1 po 30 mins prior to meals bid  . famotidine (PEPCID) 40 MG tablet Take 1 tablet (40 mg total) by mouth at bedtime. As needed to control reflux  . furosemide (LASIX) 40 MG tablet Take 40 mg by mouth daily.   Marland Kitchen HUMALOG KWIKPEN 100 UNIT/ML SOPN Inject 14-18 Units into the skin 3 (three) times daily with meals. Based on SLIDING SCALE  . HYDROcodone-acetaminophen (NORCO/VICODIN) 5-325 MG per tablet Take 1 tablet by mouth every 8 (eight) hours as needed for moderate pain or severe pain.   Marland Kitchen ipratropium-albuterol (DUONEB) 0.5-2.5 (3) MG/3ML SOLN 1 neb every 4-6 hours as needed for wheezing/shortness of breath  . LANTUS SOLOSTAR 100 UNIT/ML SOPN Inject 26 Units into the skin at bedtime.   . metFORMIN (GLUCOPHAGE) 1000 MG tablet Take 1,000 mg by mouth 2 (two) times daily with a meal.  . metoprolol succinate (TOPROL-XL) 25 MG 24 hr tablet Take 50 mg by mouth daily.   Marland Kitchen PHARMACIST CHOICE LANCETS MISC Use as directed  . [DISCONTINUED] budesonide-formoterol (SYMBICORT) 160-4.5 MCG/ACT inhaler Inhale 2 puffs into the lungs 2 (two) times daily.

## 2013-08-23 NOTE — Patient Instructions (Addendum)
No change in medications. Return in       6 months You did not qualify or need the oxygen with exertion

## 2013-08-24 ENCOUNTER — Other Ambulatory Visit: Payer: Self-pay

## 2013-08-24 ENCOUNTER — Ambulatory Visit (HOSPITAL_COMMUNITY): Payer: Managed Care, Other (non HMO)

## 2013-08-24 DIAGNOSIS — D509 Iron deficiency anemia, unspecified: Secondary | ICD-10-CM

## 2013-08-24 NOTE — Assessment & Plan Note (Addendum)
Chronic obstructive lung disease with asthmatic bronchitic component with reversible degree of airflow obstruction. Note baseline pulmonary function show small airway obstruction with 100% predicted FEV1  Plan Maintain inhaled medications as prescribed Note on exertional oxygen testing the patient did not qualify for home oxygen therapy

## 2013-08-24 NOTE — Progress Notes (Signed)
Quick Note:  Called and informed pt. Mailed the lab order to pt so she can go to PCP to have it drawn. ______

## 2013-09-14 LAB — CBC WITH DIFFERENTIAL/PLATELET
BASOS ABS: 0.1 10*3/uL (ref 0.0–0.1)
BASOS PCT: 1 % (ref 0–1)
EOS PCT: 6 % — AB (ref 0–5)
Eosinophils Absolute: 0.6 10*3/uL (ref 0.0–0.7)
HCT: 33.2 % — ABNORMAL LOW (ref 36.0–46.0)
Hemoglobin: 10.4 g/dL — ABNORMAL LOW (ref 12.0–15.0)
Lymphocytes Relative: 24 % (ref 12–46)
Lymphs Abs: 2.5 10*3/uL (ref 0.7–4.0)
MCH: 24.2 pg — AB (ref 26.0–34.0)
MCHC: 31.3 g/dL (ref 30.0–36.0)
MCV: 77.2 fL — ABNORMAL LOW (ref 78.0–100.0)
Monocytes Absolute: 0.8 10*3/uL (ref 0.1–1.0)
Monocytes Relative: 8 % (ref 3–12)
NEUTROS ABS: 6.5 10*3/uL (ref 1.7–7.7)
Neutrophils Relative %: 61 % (ref 43–77)
Platelets: 332 10*3/uL (ref 150–400)
RBC: 4.3 MIL/uL (ref 3.87–5.11)
RDW: 23.8 % — AB (ref 11.5–15.5)
WBC: 10.4 10*3/uL (ref 4.0–10.5)

## 2013-09-15 NOTE — Progress Notes (Signed)
Quick Note:  Called and informed pt. She is scheduled to have her 3rd IV infusion tomorrow. She is feeling very well. ______

## 2013-10-06 ENCOUNTER — Other Ambulatory Visit: Payer: Self-pay | Admitting: *Deleted

## 2013-10-06 MED ORDER — FAMOTIDINE 40 MG PO TABS: 40.0000 mg | ORAL_TABLET | Freq: Every day | ORAL | Status: DC

## 2013-10-06 MED ORDER — BUDESONIDE-FORMOTEROL FUMARATE 160-4.5 MCG/ACT IN AERO: 2.0000 | INHALATION_SPRAY | Freq: Two times a day (BID) | RESPIRATORY_TRACT | Status: DC

## 2013-11-04 ENCOUNTER — Ambulatory Visit: Payer: Managed Care, Other (non HMO) | Admitting: Gastroenterology

## 2013-11-10 ENCOUNTER — Encounter: Payer: Self-pay | Admitting: Gastroenterology

## 2013-11-10 ENCOUNTER — Encounter (INDEPENDENT_AMBULATORY_CARE_PROVIDER_SITE_OTHER): Payer: Self-pay

## 2013-11-10 ENCOUNTER — Ambulatory Visit (INDEPENDENT_AMBULATORY_CARE_PROVIDER_SITE_OTHER): Payer: Managed Care, Other (non HMO) | Admitting: Gastroenterology

## 2013-11-10 VITALS — BP 160/90 | HR 104 | Temp 97.6°F | Ht 59.0 in | Wt 226.8 lb

## 2013-11-10 DIAGNOSIS — K648 Other hemorrhoids: Secondary | ICD-10-CM | POA: Insufficient documentation

## 2013-11-10 DIAGNOSIS — D509 Iron deficiency anemia, unspecified: Secondary | ICD-10-CM

## 2013-11-10 NOTE — Assessment & Plan Note (Signed)
SX: SOILING, BRBPR, PROLAPSE.  DISCUSSED BENEFITS V. RISKS OF BANDING VIA ENDOSCOPY OR [facility] BANDING. PT PREFERS [facility]. DRINK WATER EAT FIBER [facility] IN 4 WEEKS FOR BANDING.

## 2013-11-10 NOTE — Progress Notes (Signed)
Subjective:    Patient ID: [name], female    DOB: [birth year], 68 y.o.   MRN: [id number] TAPPER,DAVID B, MD  HPI LAST SEEN AND EVALUATED JAN 2015 FOR ANEMIA. EGD REVEALED LARGE HIATAL HERNIA. ANEMIA PRESUMED DUE TO CAMERON'S ULCERS AND HAD 3 IVFE. INITIALLY FERRITIN 15. NO FOLLOW UP BLOOD CHECK. MAY SEE BRBRP-1X/WEEK. NO RECTAL PRESSURE OR ITCHING. BMs: ONCE A DAY. NO CHANGE.FEELS LIKE THEY COM,E OUT WHEN SHE MOVES HER BOWELS AND THEN THEY GO BACK IN.  PT DENIES FEVER, CHILLS,  nausea, vomiting, melena, diarrhea, constipation, abd pain, problems swallowing, OR problems with sedation. RARE heartburn or indigestion SINCE TAKING NEXIUM BID.  Past Medical History  Diagnosis Date  . COPD (chronic obstructive pulmonary disease)   . Degenerative disc disease   . Vitamin B12 deficiency   . Iron deficiency anemia   . Sleep apnea   . Essential hypertension, benign   . Mixed hyperlipidemia   . Type 2 diabetes mellitus   . History of hiatal hernia   . History of diverticulitis of colon   . GERD (gastroesophageal reflux disease)   . Gout   . Neuropathy     Past Surgical History  Procedure Laterality Date  . Esophagogastroduodenoscopy  11/19/2006    SLF: Large hiatal hernia without evidence of Cameron ulcers/. Distal esophageal stricture, which allowed the gastroscope to pass without resistance.  A 16 mm Savary later passed with mild resistance/ Normal stomach.sb bx negative  . Colonoscopy  10/01/2006    SLF:Pan colonic diverticulosis and moderate internal hemorrhoids/ Otherwise no polyps, masses, inflammatory changes or AVMs/  . Cholecystectomy    . Partial hysterectomy  1978  . Tonsillectomy and adenoidectomy    . Umbilical hernia repair  2010  . Benign breast cysts    . Two back surgeries/fusion    . Abdominal hysterectomy    . Back surgery    . Esophagogastroduodenoscopy  10/01/2006    [id number] hiatal hernia.  Distal esophagus without evidence of   erythema, ulceration or  Barrett's esophagus  . Esophagogastroduodenoscopy  2011    SLF: large hh, distal esophageal web narrowing to 58mm s/p dilation to 37mm  . Colonoscopy  2011    SLF: pancolonic diverticulosis, large internal hemorrhoids  . Small bowel capsule  2008    negative  . Esophagogastroduodenoscopy N/A 08/06/2013    Procedure: ESOPHAGOGASTRODUODENOSCOPY (EGD);  Surgeon: [name], MD;  Location: [facility];  Service: Endoscopy;  Laterality: N/A;  8:30  . Givens capsule study N/A 08/06/2013    INCOMPLETE-SMALL BOWLE ULCERS   No Known Allergies  Current Outpatient Prescriptions  Medication Sig Dispense Refill  . ADVOCATE REDI-CODE test strip Use as directed      . allopurinol (ZYLOPRIM) 100 MG tablet Take 1 tablet by mouth 2 (two) times daily.      Marland Kitchen amitriptyline (ELAVIL) 100 MG tablet Take 100 mg by mouth at bedtime.        Marland Kitchen aspirin 81 MG tablet Take 81 mg by mouth daily.        Marland Kitchen atorvastatin (LIPITOR) 40 MG tablet Take 1 tablet by mouth at bedtime.       . B-D UF III MINI PEN NEEDLES 31G X 5 MM MISC Use as directed      . budesonide-formoterol (SYMBICORT) 160-4.5 MCG/ACT inhaler Inhale 2 puffs into the lungs 2 (two) times daily.    Marland Kitchen esomeprazole (NEXIUM) 40 MG capsule 1 po 30 mins prior to meals bid    .  famotidine (PEPCID) 40 MG tablet Take 1 tablet (40 mg total) by mouth at bedtime. As needed to control reflux    . furosemide (LASIX) 40 MG tablet Take 40 mg by mouth daily.     Marland Kitchen HUMALOG KWIKPEN 100 UNIT/ML SOPN Inject 14-18 Units into the skin 3 (three) times daily with meals. Based on SLIDING SCALE    . ipratropium-albuterol (DUONEB) 0.5-2.5 (3) MG/3ML SOLN 1 neb every 4-6 hours as needed for wheezing/shortness of breath    . LANTUS SOLOSTAR 100 UNIT/ML SOPN Inject 26 Units into the skin at bedtime.       . metFORMIN (GLUCOPHAGE) 1000 MG tablet Take 1,000 mg by mouth 2 (two) times daily with a meal.      . metoprolol succinate (TOPROL-XL) 25 MG 24 hr tablet Take 50 mg by mouth daily.        Marland Kitchen PHARMACIST CHOICE LANCETS MISC Use as directed         Review of Systems     Objective:   Physical Exam  Vitals reviewed. Constitutional: She is oriented to person, place, and time. She appears well-nourished. No distress.  HENT:  Head: Normocephalic and atraumatic.  Mouth/Throat: Oropharynx is clear and moist. No oropharyngeal exudate.  Eyes: Pupils are equal, round, and reactive to light. No scleral icterus.  Neck: Normal range of motion. Neck supple.  Cardiovascular: Normal rate, regular rhythm and normal heart sounds.   Pulmonary/Chest: Breath sounds normal. No respiratory distress.  Abdominal: Soft. Bowel sounds are normal. She exhibits no distension. There is no tenderness.  Musculoskeletal: She exhibits edema.  Lymphadenopathy:    She has no cervical adenopathy.  Neurological: She is alert and oriented to person, place, and time.  NO FOCAL DEFICITS  Psychiatric: She has a normal mood and affect.          Assessment & Plan:

## 2013-11-10 NOTE — Assessment & Plan Note (Signed)
LAST LABS FEB 2015.  RECHECK CBC/FERRITIN TODAY. [facility] W/ HEMATOLOGY SCHEDULED FOLLOW UP IN 6 MOS.

## 2013-11-10 NOTE — Patient Instructions (Addendum)
DRINK WATER TO KEEP YOUR URINE LIGHT YELLOW.  FOLLOW A HIGH FIBER DIET. SEE INFO BELOW.  I WILL SCHEDULE YOU FOR HEMORRHOID BANDING IN 2-3 WEEKS.  GET BLOOD COUNT AND IRON LEVEL TODAY OR WITHIN THE NEXT 2-3 WEEKS.  FOLLOW UP IN 6 MOS.   High-Fiber Diet A high-fiber diet changes your normal diet to include more whole grains, legumes, fruits, and vegetables. Changes in the diet involve replacing refined carbohydrates with unrefined foods. The calorie level of the diet is essentially unchanged. The Dietary Reference Intake (recommended amount) for adult males is 38 grams per day. For adult females, it is 25 grams per day. Pregnant and lactating women should consume 28 grams of fiber per day. Fiber is the intact part of a plant that is not broken down during digestion. Functional fiber is fiber that has been isolated from the plant to provide a beneficial effect in the body. PURPOSE  Increase stool bulk.   Ease and regulate bowel movements.   Lower cholesterol.  INDICATIONS THAT YOU NEED MORE FIBER  Constipation and hemorrhoids.   Uncomplicated diverticulosis (intestine condition) and irritable bowel syndrome.   Weight management.   As a protective measure against hardening of the arteries (atherosclerosis), diabetes, and cancer.   GUIDELINES FOR INCREASING FIBER IN THE DIET  Start adding fiber to the diet slowly. A gradual increase of about 5 more grams (2 slices of whole-wheat bread, 2 servings of most fruits or vegetables, or 1 bowl of high-fiber cereal) per day is best. Too rapid an increase in fiber may result in constipation, flatulence, and bloating.   Drink enough water and fluids to keep your urine clear or pale yellow. Water, juice, or caffeine-free drinks are recommended. Not drinking enough fluid may cause constipation.   Eat a variety of high-fiber foods rather than one type of fiber.   Try to increase your intake of fiber through using high-fiber foods rather than  fiber pills or supplements that contain small amounts of fiber.   The goal is to change the types of food eaten. Do not supplement your present diet with high-fiber foods, but replace foods in your present diet.  INCLUDE A VARIETY OF FIBER SOURCES  Replace refined and processed grains with whole grains, canned fruits with fresh fruits, and incorporate other fiber sources. White rice, white breads, and most bakery goods contain little or no fiber.   Brown whole-grain rice, buckwheat oats, and many fruits and vegetables are all good sources of fiber. These include: broccoli, Brussels sprouts, cabbage, cauliflower, beets, sweet potatoes, white potatoes (skin on), carrots, tomatoes, eggplant, squash, berries, fresh fruits, and dried fruits.   Cereals appear to be the richest source of fiber. Cereal fiber is found in whole grains and bran. Bran is the fiber-rich outer coat of cereal grain, which is largely removed in refining. In whole-grain cereals, the bran remains. In breakfast cereals, the largest amount of fiber is found in those with "bran" in their names. The fiber content is sometimes indicated on the label.   You may need to include additional fruits and vegetables each day.   In baking, for 1 cup white flour, you may use the following substitutions:   1 cup whole-wheat flour minus 2 tablespoons.   1/2 cup white flour plus 1/2 cup whole-wheat flour.

## 2013-11-10 NOTE — Progress Notes (Signed)
cc'd to pcp 

## 2013-11-11 NOTE — Progress Notes (Signed)
Reminder in epic °

## 2013-11-18 LAB — CBC WITH DIFFERENTIAL/PLATELET
BASOS ABS: 0.1 10*3/uL (ref 0.0–0.1)
Basophils Relative: 1 % (ref 0–1)
EOS ABS: 0.4 10*3/uL (ref 0.0–0.7)
EOS PCT: 5 % (ref 0–5)
HCT: 36.8 % (ref 36.0–46.0)
Hemoglobin: 12.5 g/dL (ref 12.0–15.0)
Lymphocytes Relative: 23 % (ref 12–46)
Lymphs Abs: 2 10*3/uL (ref 0.7–4.0)
MCH: 27.4 pg (ref 26.0–34.0)
MCHC: 34 g/dL (ref 30.0–36.0)
MCV: 80.7 fL (ref 78.0–100.0)
Monocytes Absolute: 0.6 10*3/uL (ref 0.1–1.0)
Monocytes Relative: 7 % (ref 3–12)
NEUTROS PCT: 64 % (ref 43–77)
Neutro Abs: 5.6 10*3/uL (ref 1.7–7.7)
PLATELETS: 289 10*3/uL (ref 150–400)
RBC: 4.56 MIL/uL (ref 3.87–5.11)
RDW: 17.4 % — AB (ref 11.5–15.5)
WBC: 8.7 10*3/uL (ref 4.0–10.5)

## 2013-11-18 LAB — FERRITIN: FERRITIN: 183 ng/mL (ref 10–291)

## 2013-11-30 NOTE — Progress Notes (Signed)
PLEASE CALL PT. HER BLOOD COUNT AND IRON STORES ARE NORMAL.

## 2013-12-01 ENCOUNTER — Ambulatory Visit (INDEPENDENT_AMBULATORY_CARE_PROVIDER_SITE_OTHER): Payer: Managed Care, Other (non HMO) | Admitting: Gastroenterology

## 2013-12-01 ENCOUNTER — Encounter: Payer: Self-pay | Admitting: Gastroenterology

## 2013-12-01 VITALS — BP 124/73 | HR 87 | Temp 97.6°F | Ht 59.0 in | Wt 223.4 lb

## 2013-12-01 DIAGNOSIS — K648 Other hemorrhoids: Secondary | ICD-10-CM

## 2013-12-01 NOTE — Progress Notes (Signed)
SYMPTOMS: RECTAL BLEEDING, PRESSURE, PAIN, ITCHING, BURNING.  CONSTIPATION: YES-RARE DIARRHEA: YES-RARE  STRAINS WITH BMs: YES  TIME SPENT ON TOILET: NOT A LONG TIME TISSUE POKES OUT OF RECTUM: YES FIBER SUPPLEMENTS: NO  GLASSES OF WATER/DAY: 6-8: NO   ADDITIONAL QUESTIONS:  LATEX ALLERGY: NO PREGNANT: NO ERECTILE DYSFUNCTION MEDS OR NITRATES: NO ANTICOAGULATION/ANTIPLATELET MEDS: NO DIAGNOSED WITH CROHN'S DISEASE, PROCTITIS, PORTAL HTN, OR ANAL/RECTAL CA: NO TAKING IMMUNOSUPPRESSANTS/XRT: NO   PLAN: 1. ANOSCOPY/[facility] BANDING TODAY   PROCEDURE TECHNIQUE: BENEFITS RISK EXPLAINED TO PT. RECTAL EXAM PERFORMED-NO MASSES. ANOSCOPY PERFORMED. BULGING INTERNAL HEMORRHOID COLUMN IN THE R POSTERIOR AND ANTERIOR COLUMNS. ONE [facility] BAND PLACED IN RIGHT ANTERIOR POSITION. POST-BANDING RECTAL EXAM REVEALED GOOD PLACEMENT. EXAM NON-TENDER.

## 2013-12-01 NOTE — Patient Instructions (Signed)
DRINK WATER TO KEEP YOUR URINE LIGHT YELLOW.  FOLLOW A HIGH FIBER DIET. SEE INFO BELOW.  FOLLOW UP Dec 16 1013.  High-Fiber Diet A high-fiber diet changes your normal diet to include more whole grains, legumes, fruits, and vegetables. Changes in the diet involve replacing refined carbohydrates with unrefined foods. The calorie level of the diet is essentially unchanged. The Dietary Reference Intake (recommended amount) for adult males is 38 grams per day. For adult females, it is 25 grams per day. Pregnant and lactating women should consume 28 grams of fiber per day. Fiber is the intact part of a plant that is not broken down during digestion. Functional fiber is fiber that has been isolated from the plant to provide a beneficial effect in the body. PURPOSE  Increase stool bulk.   Ease and regulate bowel movements.   Lower cholesterol.  INDICATIONS THAT YOU NEED MORE FIBER  Constipation and hemorrhoids.   Uncomplicated diverticulosis (intestine condition) and irritable bowel syndrome.   Weight management.   As a protective measure against hardening of the arteries (atherosclerosis), diabetes, and cancer.   GUIDELINES FOR INCREASING FIBER IN THE DIET  Start adding fiber to the diet slowly. A gradual increase of about 5 more grams (2 slices of whole-wheat bread, 2 servings of most fruits or vegetables, or 1 bowl of high-fiber cereal) per day is best. Too rapid an increase in fiber may result in constipation, flatulence, and bloating.   Drink enough water and fluids to keep your urine clear or pale yellow. Water, juice, or caffeine-free drinks are recommended. Not drinking enough fluid may cause constipation.   Eat a variety of high-fiber foods rather than one type of fiber.   Try to increase your intake of fiber through using high-fiber foods rather than fiber pills or supplements that contain small amounts of fiber.   The goal is to change the types of food eaten. Do not supplement  your present diet with high-fiber foods, but replace foods in your present diet.  INCLUDE A VARIETY OF FIBER SOURCES  Replace refined and processed grains with whole grains, canned fruits with fresh fruits, and incorporate other fiber sources. White rice, white breads, and most bakery goods contain little or no fiber.   Brown whole-grain rice, buckwheat oats, and many fruits and vegetables are all good sources of fiber. These include: broccoli, Brussels sprouts, cabbage, cauliflower, beets, sweet potatoes, white potatoes (skin on), carrots, tomatoes, eggplant, squash, berries, fresh fruits, and dried fruits.   Cereals appear to be the richest source of fiber. Cereal fiber is found in whole grains and bran. Bran is the fiber-rich outer coat of cereal grain, which is largely removed in refining. In whole-grain cereals, the bran remains. In breakfast cereals, the largest amount of fiber is found in those with "bran" in their names. The fiber content is sometimes indicated on the label.   You may need to include additional fruits and vegetables each day.   In baking, for 1 cup white flour, you may use the following substitutions:   1 cup whole-wheat flour minus 2 tablespoons.   1/2 cup white flour plus 1/2 cup whole-wheat flour.

## 2013-12-01 NOTE — Assessment & Plan Note (Signed)
R POS BAND IN 2 WEEKS

## 2013-12-01 NOTE — Progress Notes (Signed)
LMOM that blood work was normal and call if she has questions.

## 2013-12-16 ENCOUNTER — Encounter (INDEPENDENT_AMBULATORY_CARE_PROVIDER_SITE_OTHER): Payer: Self-pay

## 2013-12-16 ENCOUNTER — Encounter: Payer: Self-pay | Admitting: Gastroenterology

## 2013-12-16 ENCOUNTER — Ambulatory Visit (INDEPENDENT_AMBULATORY_CARE_PROVIDER_SITE_OTHER): Payer: Managed Care, Other (non HMO) | Admitting: Gastroenterology

## 2013-12-16 VITALS — BP 139/75 | HR 96 | Temp 98.2°F | Ht 65.0 in | Wt 221.6 lb

## 2013-12-16 DIAGNOSIS — R140 Abdominal distension (gaseous): Secondary | ICD-10-CM | POA: Insufficient documentation

## 2013-12-16 DIAGNOSIS — K648 Other hemorrhoids: Secondary | ICD-10-CM

## 2013-12-16 NOTE — Assessment & Plan Note (Signed)
SX NOT IDEALLY CONTROLLED WITH OTC MEDS.  ADD [facility] FOR 2 MOS GAS PREVENTION HANDOUT GIVEN [facility] IN 4 MOS

## 2013-12-16 NOTE — Progress Notes (Signed)
SYMPTOMS: RECTAL BLEEDING: BETTER, SOILING/PRESSURE. WANTS TO KNOW WHAT'S GOOD FOR GAS  CONSTIPATION: YES DIARRHEA: YES  STRAINS WITH BMs: NO  TIME SPENT ON TOILET: 5 MINS TISSUE POKES OUT OF RECTUM: YES- GOES BACK BY ITSELF FIBER SUPPLEMENTS: NO  GLASSES OF WATER/DAY: 6-8: YES   ADDITIONAL QUESTIONS:  LATEX ALLERGY: NO PREGNANT: NO ERECTILE DYSFUNCTION MEDS OR NITRATES: NO ANTICOAGULATION/ANTIPLATELET MEDS: NO DIAGNOSED WITH CROHN'S DISEASE, PROCTITIS, PORTAL HTN, OR ANAL/RECTAL CA: NO TAKING IMMUNOSUPPRESSANTS/XRT: NO  PLAN: 1. [facility] BANDING TODAY

## 2013-12-16 NOTE — Patient Instructions (Addendum)
TAKE A PROBIOTIC DAILY FOR OTWO MONTHS ([facility], [place]).   DRINK WATER TO KEEP YOUR URINE LIGHT YELLOW.   FOLLOW A HIGH FIBER DIET.   AVOID ITEMS THAT CAUSE BLOATING & GAS. SEE HANDOUT  FOLLOW UP JUN 4.  BLOATING AND GAS PREVENTION  Although gas may be uncomfortable and embarrassing, it is not life-threatening. Understanding causes, ways to reduce symptoms, and treatment will help most people find some relief. Points to remember   Everyone has gas in the digestive tract.   People often believe normal passage of gas to be excessive.   Gas comes from two main sources: swallowed air and normal breakdown of certain foods by harmless bacteria naturally present in the large intestine.   Many foods with carbohydrates can cause gas. Fats and proteins cause little gas.   Foods that may cause gas include o beans  o vegetables, such as broccoli, cabbage, brussels sprouts, onions, artichokes, and asparagus  o fruits, such as pears, apples, and peaches  o whole grains, such as whole wheat and bran  o soft drinks and fruit drinks  o milk and milk products, such as cheese and ice cream, and packaged foods prepared with lactose, such as bread, cereal, and salad dressing  o foods containing sorbitol, such as dietetic foods and sugar free candies and gums   The most common symptoms of gas are belching, flatulence, bloating, and abdominal pain. However, some of these symptoms are often caused by an intestinal disorder, such as irritable bowel syndrome, rather than too much gas.   The most common ways to reduce the discomfort of gas are changing diet, taking nonprescription medicines, and reducing the amount of air swallowed.   Digestive enzymes, such as lactase supplements, actually help digest carbohydrates and may allow people to eat foods that normally cause gas.

## 2013-12-16 NOTE — Assessment & Plan Note (Signed)
L LAT BAND IN 2 WEEKS

## 2014-01-06 ENCOUNTER — Encounter: Payer: Self-pay | Admitting: Gastroenterology

## 2014-01-06 ENCOUNTER — Ambulatory Visit (INDEPENDENT_AMBULATORY_CARE_PROVIDER_SITE_OTHER): Payer: Managed Care, Other (non HMO) | Admitting: Gastroenterology

## 2014-01-06 ENCOUNTER — Encounter (INDEPENDENT_AMBULATORY_CARE_PROVIDER_SITE_OTHER): Payer: Self-pay

## 2014-01-06 VITALS — BP 145/82 | HR 94 | Temp 97.2°F | Ht 59.0 in | Wt 225.0 lb

## 2014-01-06 DIAGNOSIS — K648 Other hemorrhoids: Secondary | ICD-10-CM

## 2014-01-06 NOTE — Progress Notes (Signed)
SYMPTOMS: RECTAL BLEEDING.SOILING: BETTER, HAD FEELING OF URGE TO GO TO BATHROOM AFTER LAST BAND, NO RECTAL PRESSURE, PAIN, ITCHING, BURNING TODAY.  CONSTIPATION: NO DIARRHEA: YES  STRAINS WITH BMs: YES  TIME SPENT ON TOILET: 15 MINS TISSUE POKES OUT OF RECTUM: YES- GOES BACK BY ITSELF FIBER SUPPLEMENTS: NO  GLASSES OF WATER/DAY: 6-8   ADDITIONAL QUESTIONS:  LATEX ALLERGY: NO PREGNANT: NO ERECTILE DYSFUNCTION MEDS OR NITRATES: NO ANTICOAGULATION/ANTIPLATELET MEDS: NO DIAGNOSED WITH CROHN'S DISEASE, PROCTITIS, PORTAL HTN, OR ANAL/RECTAL CA: NO TAKING IMMUNOSUPPRESSANTS/XRT: NO    PLAN: 1. [facility] BANDING TODAY  PROCEDURE TECHNIQUE: BENEFITS RISK EXPLAINED TO PT. ONE [facility] BAND PLACED IN LEFT LATERAL POSITION. POST-BANDING RECTAL EXAM REVEALED GOOD PLACEMENT. EXAM NON-TENDER

## 2014-01-06 NOTE — Patient Instructions (Signed)
CONTINUE HIGH FIBER DIET.  DRINK WATER TO KEEP YOUR URINE LIGHT YELLOW.  FOLLOW UP IN 1 MO.

## 2014-01-06 NOTE — Assessment & Plan Note (Signed)
L LAT band today

## 2014-02-17 ENCOUNTER — Ambulatory Visit (INDEPENDENT_AMBULATORY_CARE_PROVIDER_SITE_OTHER): Payer: Managed Care, Other (non HMO) | Admitting: Gastroenterology

## 2014-02-17 ENCOUNTER — Encounter: Payer: Self-pay | Admitting: Gastroenterology

## 2014-02-17 VITALS — BP 175/84 | HR 110 | Temp 97.9°F | Ht 59.0 in | Wt 222.0 lb

## 2014-02-17 DIAGNOSIS — K219 Gastro-esophageal reflux disease without esophagitis: Secondary | ICD-10-CM

## 2014-02-17 DIAGNOSIS — K648 Other hemorrhoids: Secondary | ICD-10-CM

## 2014-02-17 MED ORDER — ESOMEPRAZOLE MAGNESIUM 40 MG PO CPDR: DELAYED_RELEASE_CAPSULE | ORAL | Status: DC

## 2014-02-17 NOTE — Assessment & Plan Note (Signed)
SX IMPROVED. RARE BRBPR(SPOTTING)  CONTINUE TO MONITOR SYMPTOMS. DRINK WATER AVOID CONSTIPATION CALL WITH QUESTIONS OR CONCERNS.

## 2014-02-17 NOTE — Progress Notes (Deleted)
SYMPTOMS: RECTAL BLEEDING, PRESSURE, PAIN, ITCHING, BURNING.  CONSTIPATION: NO DIARRHEA: YES  STRAINS WITH BMs: YES  TIME SPENT ON TOILET: 15 MINS TISSUE POKES OUT OF RECTUM: YES- GOES BACK BY ITSELF FIBER SUPPLEMENTS: NO  GLASSES OF WATER/DAY: 6-8   ADDITIONAL QUESTIONS:  LATEX ALLERGY: NO PREGNANT: NO ERECTILE DYSFUNCTION MEDS OR NITRATES: NO ANTICOAGULATION/ANTIPLATELET MEDS: NO DIAGNOSED WITH CROHN'S DISEASE, PROCTITIS, PORTAL HTN, OR ANAL/RECTAL CA: NO TAKING IMMUNOSUPPRESSANTS/XRT: NO

## 2014-02-17 NOTE — Patient Instructions (Addendum)
DRINK WATER TO KEEP YOUR URINE LIGHT YELLOW.  AVOID CONSTIPATION.  CONTINUE NEXIUM OR DEXILANT TO TREAT REFLUX/HEARTBURN.  CONTINUE A LOW FAT DIET. AVOID TRIGGERS FOR REFLUX.  PLEASE CALL WITH QUESTIONS OR CONCERNS REGARDING YOUR RECTAL BLEEDING AND NEED FOR HEMORRHOID BANDING.  ROUTINE FOLLOW UP IN 6 MOS.     Lifestyle and home remedies TO HELP CONTROL HEARTBURN.  You may eliminate or reduce the frequency of heartburn by making the following lifestyle changes:    Control your weight. Being overweight is a major risk factor for heartburn and GERD. Excess pounds put pressure on your abdomen, pushing up your stomach and causing acid to back up into your esophagus.     Eat smaller meals. 4 TO 6 MEALS A DAY. This reduces pressure on the lower esophageal sphincter, helping to prevent the valve from opening and acid from washing back into your esophagus.     Loosen your belt. Clothes that fit tightly around your waist put pressure on your abdomen and the lower esophageal sphincter.       Eliminate heartburn triggers. Everyone has specific triggers.      Common triggers such as fatty or fried foods, spicy food, tomato sauce, carbonated beverages, alcohol, chocolate, mint, garlic, onion, caffeine and nicotine may make heartburn worse.     Avoid stooping or bending. Tying your shoes is OK. Bending over for longer periods to weed your garden isn't, especially soon after eating.     Don't lie down after a meal. Wait at least three to four hours after eating before going to bed, and don't lie down right after eating.     PLACE THE HEAD OF YOUR BED ON 6 INCH BLOCKS.  Alternative medicine   Several home remedies exist for treating GERD, but they provide only temporary relief. They include drinking baking soda (sodium bicarbonate) added to water or drinking other fluids such as baking soda mixed with cream of tartar and water.   Although these liquids create temporary relief by neutralizing,  washing away or buffering acids, eventually they aggravate the situation by adding gas and fluid to your stomach, increasing pressure and causing more acid reflux. Further, adding more sodium to your diet may increase your blood pressure and add stress to your heart, and excessive bicarbonate ingestion can alter the acid-base balance in your body.

## 2014-02-17 NOTE — Assessment & Plan Note (Signed)
SX FAIRLY WELL CONTROLLED.  REFILLED NEXIUM BNO DEXILANT SAMPLES LOW FAT DIET OPV IN 6 MOS

## 2014-02-17 NOTE — Progress Notes (Signed)
Subjective:    Patient ID: [name], female    DOB: 09/07/1945, 68 y.o.   MRN: [id number] [name], MD  HPI [place] doing fairly well. [place] HELPED AL LOT. RARE RECTAL BLEEDING SPOTS. NO RECTAL PAIN/PRESSURE. BMs: 1-2X/DAY(SOFT). RARE HEARTBURN/INDIGESTION.   PT DENIES FEVER, CHILLS, nausea, vomiting, melena, diarrhea, CHEST PAIN, SHORTNESS OF BREATH,  constipation, abdominal pain, OR problems swallowing.   Past Medical History  Diagnosis Date  . COPD (chronic obstructive pulmonary disease)   . Degenerative disc disease   . Vitamin B12 deficiency   . Iron deficiency anemia   . Sleep apnea   . Essential hypertension, benign   . Mixed hyperlipidemia   . Type 2 diabetes mellitus   . History of hiatal hernia   . History of diverticulitis of colon   . GERD (gastroesophageal reflux disease)   . Gout   . Neuropathy   . Hiatal hernia 07/27/2013   Past Surgical History  Procedure Laterality Date  . Esophagogastroduodenoscopy  11/19/2006    SLF: Large hiatal hernia without evidence of Cameron ulcers/. Distal esophageal stricture, which allowed the gastroscope to pass without resistance.  A 16 mm Savary later passed with mild resistance/ Normal stomach.sb bx negative  . Colonoscopy  10/01/2006    SLF:Pan colonic diverticulosis and moderate internal hemorrhoids/ Otherwise no polyps, masses, inflammatory changes or AVMs/  . Cholecystectomy    . Partial hysterectomy  1978  . Tonsillectomy and adenoidectomy    . Umbilical hernia repair  2010  . Benign breast cysts    . Two back surgeries/fusion    . Abdominal hysterectomy    . Back surgery    . Esophagogastroduodenoscopy  10/01/2006    [id number] hiatal hernia.  Distal esophagus without evidence of   erythema, ulceration or Barrett's esophagus  . Esophagogastroduodenoscopy  2011    SLF: large hh, distal esophageal web narrowing to 87mm s/p dilation to 51mm  . Colonoscopy  2011    SLF: pancolonic diverticulosis, large  internal hemorrhoids  . Small bowel capsule  2008    negative  . Esophagogastroduodenoscopy N/A 08/06/2013    Procedure: ESOPHAGOGASTRODUODENOSCOPY (EGD);  Surgeon: [name], MD;  Location: [facility];  Service: Endoscopy;  Laterality: N/A;  8:30  . Givens capsule study N/A 08/06/2013    INCOMPLETE-SMALL BOWLE ULCERS   No Known Allergies  Current Outpatient Prescriptions  Medication Sig Dispense Refill  . allopurinol (ZYLOPRIM) 100 MG tablet Take 1 tablet by mouth 2 (two) times daily.      Marland Kitchen amitriptyline (ELAVIL) 100 MG tablet Take 100 mg by mouth at bedtime.        Marland Kitchen aspirin 81 MG tablet Take 81 mg by mouth daily.        Marland Kitchen atorvastatin (LIPITOR) 40 MG tablet Take 1 tablet by mouth at bedtime.       . budesonide-formoterol (SYMBICORT) 160-4.5 MCG/ACT inhaler Inhale 2 puffs into the lungs 2 (two) times daily.  3 Inhaler  3  . esomeprazole (NEXIUM) 40 MG capsule 1 po 30 mins prior to meals bid  60 capsule  11  . famotidine (PEPCID) 40 MG tablet Take 1 tablet (40 mg total) by mouth at bedtime. As needed to control reflux  90 tablet  1  . furosemide (LASIX) 40 MG tablet Take 40 mg by mouth daily.       Marland Kitchen HUMALOG KWIKPEN 100 UNIT/ML SOPN Inject 14-18 Units into the skin 3 (three) times daily with meals. Based on SLIDING SCALE      .  LANTUS SOLOSTAR 100 UNIT/ML SOPN Inject 26 Units into the skin at bedtime.       . metFORMIN (GLUCOPHAGE) 1000 MG tablet Take 1,000 mg by mouth 2 (two) times daily with a meal.      . metoprolol succinate (TOPROL-XL) 25 MG 24 hr tablet Take 50 mg by mouth daily.       . penicillin v potassium (VEETID) 500 MG tablet Take 500 mg by mouth 4 (four) times daily.      Marland Kitchen ADVOCATE REDI-CODE test strip Use as directed      . B-D UF III MINI PEN NEEDLES 31G X 5 MM MISC Use as directed      . ipratropium-albuterol (DUONEB) 0.5-2.5 (3) MG/3ML SOLN 1 neb every 4-6 hours as needed for wheezing/shortness of breath  360 mL  5  . PHARMACIST CHOICE LANCETS MISC Use as directed        No current facility-administered medications for this visit.     Review of Systems     Objective:   Physical Exam        Assessment & Plan:

## 2014-03-07 ENCOUNTER — Encounter: Payer: Self-pay | Admitting: Critical Care Medicine

## 2014-03-07 ENCOUNTER — Ambulatory Visit (INDEPENDENT_AMBULATORY_CARE_PROVIDER_SITE_OTHER): Payer: Managed Care, Other (non HMO) | Admitting: Critical Care Medicine

## 2014-03-07 VITALS — BP 144/84 | HR 86 | Temp 97.0°F | Ht 59.0 in | Wt 219.0 lb

## 2014-03-07 DIAGNOSIS — J449 Chronic obstructive pulmonary disease, unspecified: Secondary | ICD-10-CM

## 2014-03-07 DIAGNOSIS — K148 Other diseases of tongue: Secondary | ICD-10-CM

## 2014-03-07 DIAGNOSIS — Z23 Encounter for immunization: Secondary | ICD-10-CM

## 2014-03-07 MED ORDER — IPRATROPIUM-ALBUTEROL 0.5-2.5 (3) MG/3ML IN SOLN: RESPIRATORY_TRACT | Status: DC

## 2014-03-07 MED ORDER — BUDESONIDE-FORMOTEROL FUMARATE 160-4.5 MCG/ACT IN AERO: 2.0000 | INHALATION_SPRAY | Freq: Two times a day (BID) | RESPIRATORY_TRACT | Status: DC

## 2014-03-07 NOTE — Assessment & Plan Note (Signed)
Chronic obstructive lung disease with asthmatic bronchitic component Plan Prevnar 13 given Reflux diet issued Refills sent on symbicort and nebulizer medication Return 6 months

## 2014-03-07 NOTE — Patient Instructions (Signed)
Prevnar 13 given Reflux diet issued Refills sent on symbicort and nebulizer medication Return 6 months

## 2014-03-07 NOTE — Progress Notes (Signed)
Subjective:    Patient ID: [name], female    DOB: [birth year], 68 y.o.   MRN: [id number]  HPI  68 y.o. WF Sleep apnea and Copd last seen 2008  03/07/2014 Chief Complaint  Patient presents with  . 6 month follow up    Has good days and bad days.  Seems to notice SOB more with exertion and in hot, humid weather.  Nonprod cough.  No wheezing or chest tightness.    Pt with good and bad days. Notes a dry cough.  No real wheeze dyspnea with exertion in the heat. Pt denies any significant sore throat, nasal congestion or excess secretions, fever, chills, sweats, unintended weight loss, pleurtic or exertional chest pain, orthopnea PND, or leg swelling Pt denies any increase in rescue therapy over baseline, denies waking up needing it or having any early am or nocturnal exacerbations of coughing/wheezing/or dyspnea. Pt also denies any obvious fluctuation in symptoms with  weather or environmental change or other alleviating or aggravating factors    Review of Systems  Constitutional:   No  weight loss, night sweats,  Fevers, chills, fatigue, lassitude. HEENT:   No headaches,  Difficulty swallowing,  Tooth/dental problems,  Sore throat,                No sneezing, itching, ear ache, nasal congestion, post nasal drip,   CV:  No chest pain,  Orthopnea, PND, swelling in lower extremities, anasarca, dizziness, palpitations  GI  No heartburn, indigestion, abdominal pain, nausea, vomiting, diarrhea, change in bowel habits, loss of appetite  Resp: No shortness of breath with exertion or at rest.  No excess mucus, no productive cough,  No non-productive cough,  No coughing up of blood.  No change in color of mucus.  No wheezing.  No chest wall deformity  Skin: no rash or lesions.  GU: no dysuria, change in color of urine, no urgency or frequency.  No flank pain.  MS:  No joint pain or swelling.  No decreased range of motion.  No back pain.  Psych:  No change in mood or affect. No  depression or anxiety.  No memory loss.     Objective:   Physical Exam  Filed Vitals:   03/07/14 1406  BP: 144/84  Pulse: 86  Temp: 97 F (36.1 C)  TempSrc: Oral  Height: 4\' 11"  (1.499 m)  Weight: 219 lb (99.338 kg)  SpO2: 94%    Gen: Pleasant, morbidly obese, in no distress,  normal affect  ENT: No lesions,  mouth clear,  oropharynx clear, no postnasal drip  Neck: No JVD, no TMG, no carotid bruits  Lungs: No use of accessory muscles, no dullness to percussion, clear without rales or rhonchi  Cardiovascular: RRR, heart sounds normal, no murmur or gallops, no peripheral edema  Abdomen: soft and NT, no HSM,  BS normal  Musculoskeletal: No deformities, no cyanosis or clubbing  Neuro: alert, non focal  Skin: Warm, no lesions or rashes  No results found.        Assessment & Plan:   Obstructive chronic bronchitis without exacerbation Chronic obstructive lung disease with asthmatic bronchitic component Plan Prevnar 13 given Reflux diet issued Refills sent on symbicort and nebulizer medication Return 6 months     Updated Medication List Outpatient Encounter Prescriptions as of 03/07/2014  Medication Sig  . ADVOCATE REDI-CODE test strip Use as directed  . allopurinol (ZYLOPRIM) 100 MG tablet Take 1 tablet by mouth 2 (two) times daily.  Marland Kitchen  amitriptyline (ELAVIL) 100 MG tablet Take 100 mg by mouth at bedtime.    Marland Kitchen aspirin 81 MG tablet Take 81 mg by mouth daily.    Marland Kitchen aspirin-acetaminophen-caffeine (EXCEDRIN EXTRA STRENGTH) [id number] MG per tablet Take by mouth every 6 (six) hours as needed for headache.  Marland Kitchen atorvastatin (LIPITOR) 40 MG tablet Take 1 tablet by mouth at bedtime.   . B-D UF III MINI PEN NEEDLES 31G X 5 MM MISC Use as directed  . BIOTIN PO Take 1 capsule by mouth 2 (two) times daily.  . budesonide-formoterol (SYMBICORT) 160-4.5 MCG/ACT inhaler Inhale 2 puffs into the lungs 2 (two) times daily.  . [facility] IBUPROFEN PO Take by mouth as needed.  .  cyanocobalamin (,VITAMIN B-12,) 1000 MCG/ML injection Inject 1,000 mcg into the muscle every 30 (thirty) days.  Marland Kitchen esomeprazole (NEXIUM) 40 MG capsule 1 po 30 mins prior to meals bid  . famotidine (PEPCID) 40 MG tablet Take 1 tablet (40 mg total) by mouth at bedtime. As needed to control reflux  . furosemide (LASIX) 40 MG tablet Take 40 mg by mouth daily.   Marland Kitchen HUMALOG KWIKPEN 100 UNIT/ML SOPN Inject 14-18 Units into the skin 3 (three) times daily with meals. Based on SLIDING SCALE  . HYDROcodone-acetaminophen (NORCO/VICODIN) 5-325 MG per tablet Take 1 tablet by mouth every 8 (eight) hours as needed.  Marland Kitchen LANTUS SOLOSTAR 100 UNIT/ML SOPN Inject 26 Units into the skin at bedtime.   . metFORMIN (GLUCOPHAGE) 1000 MG tablet Take 1,000 mg by mouth 2 (two) times daily with a meal.  . metoprolol succinate (TOPROL-XL) 25 MG 24 hr tablet Take 50 mg by mouth daily.   Marland Kitchen PHARMACIST CHOICE LANCETS MISC Use as directed  . [DISCONTINUED] budesonide-formoterol (SYMBICORT) 160-4.5 MCG/ACT inhaler Inhale 2 puffs into the lungs 2 (two) times daily.  Marland Kitchen ipratropium-albuterol (DUONEB) 0.5-2.5 (3) MG/3ML SOLN 1 neb every 4-6 hours as needed for wheezing/shortness of breath  . [DISCONTINUED] ipratropium-albuterol (DUONEB) 0.5-2.5 (3) MG/3ML SOLN 1 neb every 4-6 hours as needed for wheezing/shortness of breath  . [DISCONTINUED] penicillin v potassium (VEETID) 500 MG tablet Take 500 mg by mouth 4 (four) times daily.

## 2014-03-12 ENCOUNTER — Other Ambulatory Visit: Payer: Self-pay | Admitting: Critical Care Medicine

## 2014-04-13 ENCOUNTER — Encounter: Payer: Self-pay | Admitting: Gastroenterology

## 2014-04-30 DIAGNOSIS — J449 Chronic obstructive pulmonary disease, unspecified: Secondary | ICD-10-CM | POA: Insufficient documentation

## 2014-05-20 ENCOUNTER — Other Ambulatory Visit: Payer: Self-pay

## 2014-05-26 ENCOUNTER — Encounter: Payer: Self-pay | Admitting: Gastroenterology

## 2014-05-26 ENCOUNTER — Ambulatory Visit (INDEPENDENT_AMBULATORY_CARE_PROVIDER_SITE_OTHER): Payer: Managed Care, Other (non HMO) | Admitting: Gastroenterology

## 2014-05-26 VITALS — BP 139/81 | HR 99 | Temp 98.2°F | Ht 59.0 in | Wt 217.6 lb

## 2014-05-26 DIAGNOSIS — K648 Other hemorrhoids: Secondary | ICD-10-CM

## 2014-05-26 DIAGNOSIS — K219 Gastro-esophageal reflux disease without esophagitis: Secondary | ICD-10-CM

## 2014-05-26 DIAGNOSIS — D500 Iron deficiency anemia secondary to blood loss (chronic): Secondary | ICD-10-CM

## 2014-05-26 NOTE — Assessment & Plan Note (Signed)
NO SIGNS OR SYMPTOMS OF ACTIVE GI BLEED.  CONTINUE TO MONITOR SYMPTOMS. FOLLOW UP IN 6 MOS. RECHECK CBC 1 WEEK PRIOR TO NEXT VISIT

## 2014-05-26 NOTE — Patient Instructions (Signed)
DRINK WATER TO KEEP YOUR URINE LIGHT YELLOW.  FOLLOW A HIGH FIBER/LOW FAT DIET.   FOLLOW UP IN 6 MOS. MERRY CHRISTMAS AND HAPPY NEW YEAR!  YOU WILL NEED YOUR BLOOD COUNT CHECK 1 WEEK PRIOR TO YOUR NEXT VISIT.  PLEASE CALL WITH QUESTIONS OR CONCERNS.

## 2014-05-26 NOTE — Assessment & Plan Note (Signed)
SX RESOLVED.  CONTINUE TO MONITOR SYMPTOMS. DRINK WATER EAT FIBER FOLLOW UP IN 6 MOS.

## 2014-05-26 NOTE — Progress Notes (Signed)
APPT NIC'D °

## 2014-05-26 NOTE — Progress Notes (Signed)
Subjective:    Patient ID: [name], female    DOB: 23-Feb-1946, 68 y.o.   MRN: [id number] TAPPER,DAVID B, MD  HPI BEEN TRAVELLING TO /[place]. HAD A GREAT TIME. BMs: DAILY. SOB FROM COPD: SAME. ASKED ABOUT PROMINENCE IN R ABDOMEN: NONTENDER. NO RECTAL PRESSURE, PAIN, ITCHING, BURNING, OR SOILING. PT DENIES FEVER, CHILLS, HEMATOCHEZIA, nausea, vomiting, melena, diarrhea, CHEST PAIN, constipation, abdominal pain, OR problems swallowing. OCCASIONAL heartburn.  Past Medical History  Diagnosis Date  . COPD (chronic obstructive pulmonary disease)   . Degenerative disc disease   . Vitamin B12 deficiency   . Iron deficiency anemia   . Sleep apnea   . Essential hypertension, benign   . Mixed hyperlipidemia   . Type 2 diabetes mellitus   . History of hiatal hernia   . History of diverticulitis of colon   . GERD (gastroesophageal reflux disease)   . Gout   . Neuropathy   . Hiatal hernia 07/27/2013   Past Surgical History  Procedure Laterality Date  . Esophagogastroduodenoscopy  11/19/2006    SLF: Large hiatal hernia without evidence of Cameron ulcers/. Distal esophageal stricture, which allowed the gastroscope to pass without resistance.  A 16 mm Savary later passed with mild resistance/ Normal stomach.sb bx negative  . Colonoscopy  10/01/2006    SLF:Pan colonic diverticulosis and moderate internal hemorrhoids/ Otherwise no polyps, masses, inflammatory changes or AVMs/  . Cholecystectomy    . Partial hysterectomy  1978  . Tonsillectomy and adenoidectomy    . Umbilical hernia repair  2010  . Benign breast cysts    . Two back surgeries/fusion    . Abdominal hysterectomy    . Back surgery    . Esophagogastroduodenoscopy  10/01/2006    [id number] hiatal hernia.  Distal esophagus without evidence of   erythema, ulceration or Barrett's esophagus  . Esophagogastroduodenoscopy  2011    SLF: large hh, distal esophageal web narrowing to 15mm s/p dilation to 72mm  . Colonoscopy  2011   SLF: pancolonic diverticulosis, large internal hemorrhoids  . Small bowel capsule  2008    negative  . Esophagogastroduodenoscopy N/A 08/06/2013    Procedure: ESOPHAGOGASTRODUODENOSCOPY (EGD);  Surgeon: [name], MD;  Location: [facility];  Service: Endoscopy;  Laterality: N/A;  8:30  . Givens capsule study N/A 08/06/2013    INCOMPLETE-SMALL BOWLE ULCERS   No Known Allergies  Current Outpatient Prescriptions  Medication Sig Dispense Refill  . ADVOCATE REDI-CODE test strip Use as directed      . allopurinol (ZYLOPRIM) 100 MG tablet Take 1 tablet by mouth 2 (two) times daily.      Marland Kitchen amitriptyline (ELAVIL) 100 MG tablet Take 100 mg by mouth at bedtime.        Marland Kitchen aspirin 81 MG tablet Take 81 mg by mouth daily.        Marland Kitchen atorvastatin (LIPITOR) 40 MG tablet Take 1 tablet by mouth at bedtime.       . B-D UF III MINI PEN NEEDLES 31G X 5 MM MISC Use as directed      . BIOTIN PO Take 1 capsule by mouth 2 (two) times daily.    . budesonide-formoterol (SYMBICORT) 160-4.5 MCG/ACT inhaler Inhale 2 puffs into the lungs 2 (two) times daily.    . [facility] IBUPROFEN PO Take by mouth as needed.    . cyanocobalamin (,VITAMIN B-12,) 1000 MCG/ML injection Inject 1,000 mcg into the muscle every 30 (thirty) days.    Marland Kitchen esomeprazole (NEXIUM) 40 MG capsule  1 po 30 mins prior to meals bid    . famotidine (PEPCID) 40 MG tablet TAKE 1 TABLET AT BEDTIME AS NEEDED TO CONTROL REFLUX    . furosemide (LASIX) 40 MG tablet Take 40 mg by mouth daily.     Marland Kitchen HUMALOG KWIKPEN 100 UNIT/ML SOPN Inject 14-18 Units into the skin 3 (three) times daily with meals. Based on SLIDING SCALE    . HYDROcodone-acetaminophen (NORCO/VICODIN) 5-325 MG per tablet Take 1 tablet by mouth every 8 (eight) hours as needed.    Marland Kitchen ipratropium-albuterol (DUONEB) 0.5-2.5 (3) MG/3ML SOLN 1 neb every 4-6 hours as needed for wheezing/shortness of breath    . LANTUS SOLOSTAR 100 UNIT/ML SOPN Inject 24 Units into the skin at bedtime.       . metFORMIN  (GLUCOPHAGE) 1000 MG tablet Take 1,000 mg by mouth 2 (two) times daily with a meal.      . metoprolol succinate (TOPROL-XL) 25 MG 24 hr tablet Take 50 mg by mouth daily.       Marland Kitchen PHARMACIST CHOICE LANCETS MISC Use as directed      . aspirin-acetaminophen-caffeine (EXCEDRIN EXTRA STRENGTH) [id number] MG per tablet Take by mouth every 6 (six) hours as needed for headache.       History   Social History  . Marital Status: Married         Number of Children: 4  .     Social History Main Topics  . Smoking status: Former Smoker -- 1.00 packs/day for 1 years    Types: Cigarettes    Quit date: 08/05/1961  . Smokeless tobacco: Former Systems developer    Quit date: 06/30/1962     Comment: Quit smoking x 50 years  . Alcohol Use: No  . Drug Use: No  . Sexual Activity: None   Social History Narrative   HAS 4 SON-GRAND KIDS AND GREAT GRAND KIDS.     Review of Systems     Objective:   Physical Exam  Vitals reviewed. Constitutional: She is oriented to person, place, and time. She appears well-developed and well-nourished. No distress.  HENT:  Head: Normocephalic and atraumatic.  Mouth/Throat: Oropharynx is clear and moist. No oropharyngeal exudate.  Eyes: Pupils are equal, round, and reactive to light. No scleral icterus.  Neck: Normal range of motion. Neck supple.  Cardiovascular: Normal rate, regular rhythm and normal heart sounds.   Pulmonary/Chest: Effort normal and breath sounds normal. No respiratory distress.  Abdominal: Soft. Bowel sounds are normal. She exhibits no distension. There is no tenderness.  Musculoskeletal: She exhibits edema (TRACE BIL LE).  Lymphadenopathy:    She has no cervical adenopathy.  Neurological: She is alert and oriented to person, place, and time.  NO FOCAL DEFICITS   Psychiatric: She has a normal mood and affect.          Assessment & Plan:

## 2014-05-26 NOTE — Assessment & Plan Note (Signed)
SX FAIRLY WELL CONTROLLED.  CONTINUE TO MONITOR SYMPTOMS.

## 2014-05-26 NOTE — Progress Notes (Signed)
cc'ed to pcp °

## 2014-10-06 DIAGNOSIS — Z95828 Presence of other vascular implants and grafts: Secondary | ICD-10-CM | POA: Insufficient documentation

## 2014-10-25 ENCOUNTER — Encounter: Payer: Self-pay | Admitting: Gastroenterology

## 2014-11-18 ENCOUNTER — Other Ambulatory Visit: Payer: Self-pay | Admitting: *Deleted

## 2014-11-18 MED ORDER — FAMOTIDINE 40 MG PO TABS: ORAL_TABLET | ORAL | Status: DC

## 2014-12-14 ENCOUNTER — Other Ambulatory Visit: Payer: Self-pay

## 2014-12-14 ENCOUNTER — Ambulatory Visit (INDEPENDENT_AMBULATORY_CARE_PROVIDER_SITE_OTHER): Payer: Managed Care, Other (non HMO) | Admitting: Gastroenterology

## 2014-12-14 ENCOUNTER — Encounter: Payer: Self-pay | Admitting: Gastroenterology

## 2014-12-14 VITALS — BP 184/90 | HR 99 | Temp 97.0°F | Ht 59.0 in | Wt 230.2 lb

## 2014-12-14 DIAGNOSIS — K219 Gastro-esophageal reflux disease without esophagitis: Secondary | ICD-10-CM

## 2014-12-14 DIAGNOSIS — R197 Diarrhea, unspecified: Secondary | ICD-10-CM

## 2014-12-14 DIAGNOSIS — R112 Nausea with vomiting, unspecified: Secondary | ICD-10-CM

## 2014-12-14 MED ORDER — LIDOCAINE VISCOUS 2 % MT SOLN: OROMUCOSAL | Status: DC

## 2014-12-14 MED ORDER — ONDANSETRON HCL 4 MG PO TABS: 4.0000 mg | ORAL_TABLET | Freq: Three times a day (TID) | ORAL | Status: DC | PRN

## 2014-12-14 NOTE — Patient Instructions (Signed)
COMPLETE GASTRIC EMPTYING STUDY.  USE ZOFRAN AS NEEDED FOR NAUSEA OR VOMITING.  EAT 4-6 SMALL MEALS DAILY.  CONTINUE YOUR WEIGHT LOSS EFFORTS. LOSE 10 LBS.  CONTINUE NEXIUM. TAKE 30 MINUTES BEFORE MEALS TWICE DAILY.  PEPCID HELPS MOST WHEN USED AS NEEDED.  USE VISCOUS LIDOCAINE 2 TSP AS NEEDED FOR FLARES OF HEARTBURN, CHEST PAIN, OR UPPER ABDOMINAL PAIN.  CALL IN ONE MO IF SYMPTOMS ARE NOT  IMPROVED.  FOLLOW UP IN 3 MOS.

## 2014-12-14 NOTE — Progress Notes (Signed)
cc'ed to pcp °

## 2014-12-14 NOTE — Assessment & Plan Note (Addendum)
IN SETTING OF A HIATAL HERNIA AND IS THE MOST LIKELY CAUSE FOR INTERMITTENT EPISODE OF NASUAE/VOMTIING. SX LESS LIKLEY EXACERBATED BY GASTROPARESIS.  EAT SMALL MEALS NEXUM BID PEPCID PRN LOSE WEIGHT CALL IN ONE MO IF SX NOT IMPROVED GES NEXT WEEK ADD VISCOUS LIDOCAINE PRN. FOLLOW UP IN 3 MOS.

## 2014-12-14 NOTE — Progress Notes (Signed)
Subjective:    Patient ID: [name], female    DOB: Dec 05, 1945, 69 y.o.   MRN: [id number]  [name], MD  HPI EPISODE IN FEB 2016 AND ONE IN MAY 2016. A FEW MILDER ONES IN BETWEEN. FIRST NAUSEA: BAD PAINS IN STOMACH THEN VOMITING/DIARRHEA. LASTS: 24-48 HR.  JUST WAITS IT OUT. NO MEDS TRIED. AT FIRST LOOSE STOOLS THEN WATERY: 5-6(NO BLOOD). MILD SOB WITH EPISODES. BMs DON;T MAKE PAIN BETTER. HEARTBURN:  AFTER SHE EATS OR DRINKS HAS A BURNING SENSATION IN HER CHEST(LASTS< 1-2 HRS). TAKES NEXIUM AND  WAITS IT OUT. ASA DAILY. NO BC/GOODY POWDERS, IBUPROFEN/MOTRIN, OR NAPROXEN/ALEVE SINCE JAN 2016.   PT DENIES FEVER, CHILLS, HEMATOCHEZIA, HEMATEMESIS,  melena, CHEST PAIN, problems swallowing, OR heartburn or indigestion.   Past Medical History  Diagnosis Date  . COPD (chronic obstructive pulmonary disease)   . Degenerative disc disease   . Vitamin B12 deficiency   . Iron deficiency anemia   . Sleep apnea   . Essential hypertension, benign   . Mixed hyperlipidemia   . Type 2 diabetes mellitus   . History of hiatal hernia   . History of diverticulitis of colon   . GERD (gastroesophageal reflux disease)   . Gout   . Neuropathy   . Hiatal hernia 07/27/2013   Past Surgical History  Procedure Laterality Date  . Esophagogastroduodenoscopy  11/19/2006    SLF: Large hiatal hernia without evidence of Cameron ulcers/. Distal esophageal stricture, which allowed the gastroscope to pass without resistance.  A 16 mm Savary later passed with mild resistance/ Normal stomach.sb bx negative  . Colonoscopy  10/01/2006    SLF:Pan colonic diverticulosis and moderate internal hemorrhoids/ Otherwise no polyps, masses, inflammatory changes or AVMs/  . Cholecystectomy    . Partial hysterectomy  1978  . Tonsillectomy and adenoidectomy    . Umbilical hernia repair  2010  . Benign breast cysts    . Two back surgeries/fusion    . Abdominal hysterectomy    . Back surgery    .  Esophagogastroduodenoscopy  10/01/2006    [id number] hiatal hernia.  Distal esophagus without evidence of   erythema, ulceration or Barrett's esophagus  . Esophagogastroduodenoscopy  2011    SLF: large hh, distal esophageal web narrowing to 66mm s/p dilation to 31mm  . Colonoscopy  2011    SLF: pancolonic diverticulosis, large internal hemorrhoids  . Small bowel capsule  2008    negative  . Esophagogastroduodenoscopy N/A 08/06/2013    SLF: 1. Stricture at the gastroesophageal junction 2. large hiatal hernia. 3. Mild erosive gastritis.  . Givens capsule study N/A 08/06/2013    INCOMPLETE-SMALL BOWLE ULCERS   No Known Allergies  Current Outpatient Prescriptions  Medication Sig Dispense Refill  . ADVOCATE REDI-CODE test strip Use as directed    . allopurinol (ZYLOPRIM) 100 MG tablet Take 1 tablet by mouth 2 (two) times daily.    Marland Kitchen amitriptyline (ELAVIL) 100 MG tablet Take 100 mg by mouth at bedtime.      Marland Kitchen aspirin 81 MG tablet Take 81 mg by mouth daily.      Marland Kitchen atorvastatin (LIPITOR) 40 MG tablet Take 1 tablet by mouth at bedtime.     . B-D UF III MINI PEN NEEDLES 31G X 5 MM MISC Use as directed    . BIOTIN PO Take 1 capsule by mouth 2 (two) times daily.    . budesonide-formoterol (SYMBICORT) 160-4.5 MCG/ACT inhaler Inhale 2 puffs into the lungs 2 (two) times daily.    Marland Kitchen  [facility] IBUPROFEN PO Take by mouth as needed.    . cyanocobalamin (,VITAMIN B-12,) 1000 MCG/ML injection Inject 1,000 mcg into the muscle every 30 (thirty) days.    Marland Kitchen esomeprazole (NEXIUM) 40 MG capsule 1 po 30 mins prior to meals bid    . famotidine (PEPCID) 40 MG tablet TAKE 1 TABLET AT BEDTIME AS NEEDED TO CONTROL REFLUX AT BEDTIME MOST NIGHTS   . furosemide (LASIX) 40 MG tablet Take 40 mg by mouth daily.     Marland Kitchen HUMALOG KWIKPEN 100 UNIT/ML SOPN Inject 14-18 Units into the skin 3 (three) times daily with meals. Based on SLIDING SCALE    . ipratropium-albuterol (DUONEB) 0.5-2.5 (3) MG/3ML SOLN 1 neb every 4-6 hours as needed for  wheezing/shortness of breath    . LANTUS SOLOSTAR 100 UNIT/ML SOPN Inject 26 Units into the skin at bedtime.     . metFORMIN (GLUCOPHAGE) 1000 MG tablet Take 1,000 mg by mouth 2 (two) times daily with a meal.    . metoprolol succinate (TOPROL-XL) 25 MG 24 hr tablet Take 50 mg by mouth daily.     Marland Kitchen PHARMACIST CHOICE LANCETS MISC Use as directed    .      Marland Kitchen HYDROcodone-acetaminophen (NORCO/VICODIN) 5-325 MG per tablet Take 1 tablet by mouth every 8 (eight) hours as needed.     Review of Systems     Objective:   Physical Exam  Constitutional: She is oriented to person, place, and time. No distress.  HENT:  Head: Normocephalic and atraumatic.  Mouth/Throat: Oropharynx is clear and moist. No oropharyngeal exudate.  Eyes: Pupils are equal, round, and reactive to light. No scleral icterus.  Neck: Normal range of motion. Neck supple.  Cardiovascular: Normal rate, regular rhythm and normal heart sounds.   Pulmonary/Chest: Effort normal and breath sounds normal. No respiratory distress.  Abdominal: Soft. Bowel sounds are normal. She exhibits no distension. There is tenderness. There is no rebound and no guarding.  MODERATE TTP IN RUQ/EPIGASTRIUM  Musculoskeletal: She exhibits edema. Tenderness: TRACE.  Lymphadenopathy:    She has no cervical adenopathy.  Neurological: She is alert and oriented to person, place, and time.  NO NEW FOCAL DEFICITS   Psychiatric: She has a normal mood and affect.  Vitals reviewed.         Assessment & Plan:

## 2014-12-26 ENCOUNTER — Encounter (HOSPITAL_COMMUNITY)
Admission: RE | Admit: 2014-12-26 | Discharge: 2014-12-26 | Disposition: A | Discharge status: Home / Self Care | Payer: Managed Care, Other (non HMO) | Source: Ambulatory Visit | Attending: Gastroenterology | Admitting: Gastroenterology

## 2014-12-26 ENCOUNTER — Telehealth: Payer: Self-pay | Admitting: Gastroenterology

## 2014-12-26 DIAGNOSIS — R112 Nausea with vomiting, unspecified: Secondary | ICD-10-CM | POA: Diagnosis not present

## 2014-12-26 MED ORDER — TECHNETIUM TC 99M SULFUR COLLOID
2.0000 | Freq: Once | INTRAVENOUS | Status: AC | PRN
Start: 2014-12-26 — End: 2014-12-26
  Administered 2014-12-26: 2 via ORAL

## 2014-12-26 NOTE — Telephone Encounter (Signed)
Pt is aware of results. 

## 2014-12-26 NOTE — Telephone Encounter (Signed)
PLEASE CALL PT. HER GES IS NORMAL. HER HIATAL HERNIA & REFLUX ARE THE MOST LIKELY CAUSE FOR INTERMITTENT EPISODE OF NAUSEA/VOMTIING.   EAT SMALL MEALS 4-6 TIMES A DAY. NEXUM BID PEPCID PRN LOSE WEIGHT USE VISCOUS LIDOCAINE PRN. FOLLOW UP IN AUG 2016.

## 2015-01-09 NOTE — Telephone Encounter (Signed)
REMINDER IN EPIC °

## 2015-01-30 ENCOUNTER — Other Ambulatory Visit: Payer: Self-pay

## 2015-01-30 ENCOUNTER — Other Ambulatory Visit: Payer: Self-pay | Admitting: Gastroenterology

## 2015-02-08 ENCOUNTER — Encounter: Payer: Self-pay | Admitting: Gastroenterology

## 2015-03-29 ENCOUNTER — Ambulatory Visit (INDEPENDENT_AMBULATORY_CARE_PROVIDER_SITE_OTHER): Payer: Managed Care, Other (non HMO) | Admitting: Gastroenterology

## 2015-03-29 VITALS — BP 124/68 | HR 91 | Temp 97.6°F | Ht 63.0 in | Wt 227.2 lb

## 2015-03-29 DIAGNOSIS — R197 Diarrhea, unspecified: Secondary | ICD-10-CM

## 2015-03-29 DIAGNOSIS — R1115 Cyclical vomiting syndrome unrelated to migraine: Secondary | ICD-10-CM

## 2015-03-29 DIAGNOSIS — R112 Nausea with vomiting, unspecified: Secondary | ICD-10-CM | POA: Insufficient documentation

## 2015-03-29 DIAGNOSIS — G43A0 Cyclical vomiting, not intractable: Secondary | ICD-10-CM

## 2015-03-29 MED ORDER — ELUXADOLINE 75 MG PO TABS: 75.0000 mg | ORAL_TABLET | Freq: Two times a day (BID) | ORAL | Status: DC

## 2015-03-29 MED ORDER — LIDOCAINE VISCOUS 2 % MT SOLN: OROMUCOSAL | Status: DC

## 2015-03-29 MED ORDER — ONDANSETRON HCL 4 MG PO TABS: 4.0000 mg | ORAL_TABLET | Freq: Three times a day (TID) | ORAL | Status: DC | PRN

## 2015-03-29 NOTE — Progress Notes (Signed)
ON RECALL  °

## 2015-03-29 NOTE — Progress Notes (Signed)
CC'ED TO PCP 

## 2015-03-29 NOTE — Patient Instructions (Addendum)
SUBMIT YOUR URINE AND STOOL SAMPLES TODAY IF POSSIBLE.  CONTINUE YOUR WEIGHT LOSS EFFORTS. LOSE 10 LBS.  AVOID FOOD THAT TRIGGERS REFLUX.  SEE INFO BELOW.  ADD ZOFRAN AND VISCOUS LIDOCAINE AS NEEDED FOR NAUSEA/CHEST OR UPPER ABDOMINAL PAIN.  CONTINUE NEXIUM. TAKE 30 MINUTES BEFORE MEALS TWICE DAILY.  PEPCID HELPS MOST WHEN USED AS NEEDED.  FOLLOW UP IN 3 MOS.        Lifestyle and home remedies TO MANAGE REFLUX/CHEST PAIN/NASUEA/VOMITING  You may eliminate or reduce the frequency of heartburn by making the following lifestyle changes:  . Control your weight. Being overweight is a major risk factor for heartburn and GERD. Excess pounds put pressure on your abdomen, pushing up your stomach and causing acid to back up into your esophagus.   . Eat smaller meals. 4 TO 6 MEALS A DAY. This reduces pressure on the lower esophageal sphincter, helping to prevent the valve from opening and acid from washing back into your esophagus.   Dolphus Jenny your belt. Clothes that fit tightly around your waist put pressure on your abdomen and the lower esophageal sphincter.   . Eliminate heartburn triggers. Everyone has specific triggers. Common triggers such as fatty or fried foods, spicy food, tomato sauce, carbonated beverages, alcohol, chocolate, mint, garlic, onion, CUCUMBERS, TOMATOES, CABBAGE, caffeine and nicotine may make heartburn worse.   Marland Kitchen Avoid stooping or bending. Tying your shoes is OK. Bending over for longer periods to weed your garden isn't, especially soon after eating.   . Don't lie down after a meal. Wait at least three to four hours after eating before going to bed, and don't lie down right after eating.   Marland Kitchen PUT THE HEAD OF YOUR BED ON 6 INCH BLOCKS.   Alternative medicine . Several home remedies exist for treating GERD, but they provide only temporary relief. They include drinking baking soda (sodium bicarbonate) added to water or drinking other fluids such as baking soda mixed  with cream of tartar and water.  . Although these liquids create temporary relief by neutralizing, washing away or buffering acids, eventually they aggravate the situation by adding gas and fluid to your stomach, increasing pressure and causing more acid reflux. Further, adding more sodium to your diet may increase your blood pressure and add stress to your heart, and excessive bicarbonate ingestion can alter the acid-base balance in your body.      Low-Fat Diet BREADS, CEREALS, PASTA, RICE, DRIED PEAS, AND BEANS These products are high in carbohydrates and most are low in fat. Therefore, they can be increased in the diet as substitutes for fatty foods. They too, however, contain calories and should not be eaten in excess. Cereals can be eaten for snacks as well as for breakfast.   FRUITS AND VEGETABLES It is good to eat fruits and vegetables. Besides being sources of fiber, both are rich in vitamins and some minerals. They help you get the daily allowances of these nutrients. Fruits and vegetables can be used for snacks and desserts.  MEATS Limit lean meat, chicken, [place], and fish to no more than 6 ounces per day. Beef, Pork, and Lamb Use lean cuts of beef, pork, and lamb. Lean cuts include:  Extra-lean ground beef.  Arm roast.  Sirloin tip.  Center-cut ham.  Round steak.  Loin chops.  Rump roast.  Tenderloin.  Trim all fat off the outside of meats before cooking. It is not necessary to severely decrease the intake of red meat, but lean choices should be  made. Lean meat is rich in protein and contains a highly absorbable form of iron. Premenopausal women, in particular, should avoid reducing lean red meat because this could increase the risk for low red blood cells (iron-deficiency anemia).  Chicken and [place] These are good sources of protein. The fat of poultry can be reduced by removing the skin and underlying fat layers before cooking. Chicken and [place] can be substituted for  lean red meat in the diet. Poultry should not be fried or covered with high-fat sauces. Fish and Shellfish Fish is a good source of protein. Shellfish contain cholesterol, but they usually are low in saturated fatty acids. The preparation of fish is important. Like chicken and [place], they should not be fried or covered with high-fat sauces. EGGS Egg whites contain no fat or cholesterol. They can be eaten often. Try 1 to 2 egg whites instead of whole eggs in recipes or use egg substitutes that do not contain yolk. MILK AND DAIRY PRODUCTS Use skim or 1% milk instead of 2% or whole milk. Decrease whole milk, natural, and processed cheeses. Use nonfat or low-fat (2%) cottage cheese or low-fat cheeses made from vegetable oils. Choose nonfat or low-fat (1 to 2%) yogurt. Experiment with evaporated skim milk in recipes that call for heavy cream. Substitute low-fat yogurt or low-fat cottage cheese for sour cream in dips and salad dressings. Have at least 2 servings of low-fat dairy products, such as 2 glasses of skim (or 1%) milk each day to help get your daily calcium intake. FATS AND OILS Reduce the total intake of fats, especially saturated fat. Butterfat, lard, and beef fats are high in saturated fat and cholesterol. These should be avoided as much as possible. Vegetable fats do not contain cholesterol, but certain vegetable fats, such as coconut oil, palm oil, and palm kernel oil are very high in saturated fats. These should be limited. These fats are often used in bakery goods, processed foods, popcorn, oils, and nondairy creamers. Vegetable shortenings and some peanut butters contain hydrogenated oils, which are also saturated fats. Read the labels on these foods and check for saturated vegetable oils. Unsaturated vegetable oils and fats do not raise blood cholesterol. However, they should be limited because they are fats and are high in calories. Total fat should still be limited to 30% of your daily  caloric intake. Desirable liquid vegetable oils are corn oil, cottonseed oil, olive oil, canola oil, safflower oil, soybean oil, and sunflower oil. Peanut oil is not as good, but small amounts are acceptable. Buy a heart-healthy tub margarine that has no partially hydrogenated oils in the ingredients. Mayonnaise and salad dressings often are made from unsaturated fats, but they should also be limited because of their high calorie and fat content. Seeds, nuts, peanut butter, olives, and avocados are high in fat, but the fat is mainly the unsaturated type. These foods should be limited mainly to avoid excess calories and fat. OTHER EATING TIPS Snacks  Most sweets should be limited as snacks. They tend to be rich in calories and fats, and their caloric content outweighs their nutritional value. Some good choices in snacks are graham crackers, melba toast, soda crackers, bagels (no egg), English muffins, fruits, and vegetables. These snacks are preferable to snack crackers, [place] fries, TORTILLA CHIPS, and POTATO chips. Popcorn should be air-popped or cooked in small amounts of liquid vegetable oil. Desserts Eat fruit, low-fat yogurt, and fruit ices instead of pastries, cake, and cookies. Sherbet, angel food cake, gelatin dessert,  frozen low-fat yogurt, or other frozen products that do not contain saturated fat (pure fruit juice bars, frozen ice pops) are also acceptable.  COOKING METHODS Choose those methods that use little or no fat. They include: Poaching.  Braising.  Steaming.  Grilling.  Baking.  Stir-frying.  Broiling.  Microwaving.  Foods can be cooked in a nonstick pan without added fat, or use a nonfat cooking spray in regular cookware. Limit fried foods and avoid frying in saturated fat. Add moisture to lean meats by using water, broth, cooking wines, and other nonfat or low-fat sauces along with the cooking methods mentioned above. Soups and stews should be chilled after cooking. The fat  that forms on top after a few hours in the refrigerator should be skimmed off. When preparing meals, avoid using excess salt. Salt can contribute to raising blood pressure in some people.  EATING AWAY FROM HOME Order entres, potatoes, and vegetables without sauces or butter. When meat exceeds the size of a deck of cards (3 to 4 ounces), the rest can be taken home for another meal. Choose vegetable or fruit salads and ask for low-calorie salad dressings to be served on the side. Use dressings sparingly. Limit high-fat toppings, such as bacon, crumbled eggs, cheese, sunflower seeds, and olives. Ask for heart-healthy tub margarine instead of butter.

## 2015-03-29 NOTE — Assessment & Plan Note (Addendum)
SYMPTOMS NOT CONTROLLED & consistent with IBS-D. DIFFERENTIAL DIAGNOSIS INCLUDES BILE SALT INDUCED DIARRHEA, OR DIABETIC COLOPATHY/ENTEROPATHY, LESS LIKELY INFECTIOUS GASTROENTERITIS OR UTI.   CHECK UA/MICRO, C DIFF PCR AND GIARDIA Ag. TRY VIBERZI. TAKE ONE TABLET TWICE DAILY. USE IMODIUM 1 OR 2 TIMES A DAY IF NEEDED. PT MUST CALL IF SHE GOES MORE THAN 4 DAYS WITHOUT A BOWEL MOVEMENT OR DEVELOPS INCREASE IN NAUSEA, VOMITING, OR ABDOMINAL PAIN. MED SIDE EFFECTS DISCUSSED. FOLLOW UP IN 3 MOS.   GREATER THAN 50% WAS SPENT IN COUNSELING & COORDINATION OF CARE WITH THE PATIENT: DISCUSSED DIFFERENTIAL DIAGNOSIS, BENEFITS, RISKS, AND MANAGEMENT OF DIARRHEA, REFLUX, AND NAUSEA/VOMITING. TOTAL ENCOUNTER TIME: 80 MINS.

## 2015-03-29 NOTE — Progress Notes (Signed)
Subjective:    Patient ID: [name], female    DOB: 02-20-1946, 69 y.o.   MRN: [id number]  Rita Lair, MD  HPI FOR A COUPLE OF MOS MORE TROUBLE WITH NAUSEA.  NAUSEA: MOST DAYS & LAST JUST A FEW MINS. TRIGGERS: FEW MIN SAFER SHE EATS MOSTLY. NO MEDS TRIED. VOMITING: ONCE A WEEK. NO BLOOD IN VOMIT. HEARTBURN: NOT CONTROLLED MOST DAYS. NOTHING SETS IT OFF. MORE LIKELY TO HAPPEN IN AFTERNOONS THAN IN AMs.  EATS MOSTLY VEGGIES-STEAMED, BOILED, RAW(CABBAGE, LETTUCE , TOMATOES, LETTUCE, CARROTS, CUCUMBERS, BLACK EYED PEAS, LIMAS, CORN, PINTOS). BMs: A LITTLE MORE DIARRHEA(#6-7, 2X/DAY). IF HAVING WATERY STOOLS IT'S 4-5 TIMES IN A DAY. TRIP WENT GREAT. HAS ABDOMINAL PAIN(AROUND NAVEL, AND LOWER, SHARP THEN CRAMPY)-MOST WHEN SHE HAS DIARRHEA. PAIN BETTER AFTER BOWELS MOVE. SOB WORSE THAN USUAL-APPT PENDING WITH PULM DOC. MAY FEEL LIKE FOOD IS STICKING IN ER THROAT/CHEST(1X/WEEK)   PT DENIES FEVER, CHILLS, HEMATEMESIS, melena, OR CHEST PAIN.  Past Medical History  Diagnosis Date  . COPD (chronic obstructive pulmonary disease)   . Degenerative disc disease   . Vitamin B12 deficiency   . Iron deficiency anemia   . Sleep apnea   . Essential hypertension, benign   . Mixed hyperlipidemia   . Type 2 diabetes mellitus   . History of hiatal hernia   . History of diverticulitis of colon   . GERD (gastroesophageal reflux disease)   . Gout   . Neuropathy   . Hiatal hernia 07/27/2013   Past Surgical History  Procedure Laterality Date  . Esophagogastroduodenoscopy  11/19/2006    SLF: Large hiatal hernia without evidence of Cameron ulcers/. Distal esophageal stricture, which allowed the gastroscope to pass without resistance.  A 16 mm Savary later passed with mild resistance/ Normal stomach.sb bx negative  . Colonoscopy  10/01/2006    SLF:Pan colonic diverticulosis and moderate internal hemorrhoids/ Otherwise no polyps, masses, inflammatory changes or AVMs/  . Cholecystectomy    . Partial  hysterectomy  1978  . Tonsillectomy and adenoidectomy    . Umbilical hernia repair  2010  . Benign breast cysts    . Two back surgeries/fusion    . Abdominal hysterectomy    . Back surgery    . Esophagogastroduodenoscopy  10/01/2006    [id number] hiatal hernia.  Distal esophagus without evidence of   erythema, ulceration or Barrett's esophagus  . Esophagogastroduodenoscopy  2011    SLF: large hh, distal esophageal web narrowing to 26mm s/p dilation to 41mm  . Colonoscopy  2011    SLF: pancolonic diverticulosis, large internal hemorrhoids  . Small bowel capsule  2008    negative  . Esophagogastroduodenoscopy N/A 08/06/2013    SLF: 1. Stricture at the gastroesophageal junction 2. large hiatal hernia. 3. Mild erosive gastritis.  . Givens capsule study N/A 08/06/2013    INCOMPLETE-SMALL BOWLE ULCERS    No Known Allergies  Current Outpatient Prescriptions  Medication Sig Dispense Refill  . ADVOCATE REDI-CODE test strip Use as directed    . allopurinol (ZYLOPRIM) 100 MG tablet Take 1 tablet by mouth 2 (two) times daily.    Marland Kitchen amitriptyline (ELAVIL) 100 MG tablet Take 100 mg by mouth at bedtime.      Marland Kitchen aspirin 81 MG tablet Take 81 mg by mouth daily.      Marland Kitchen aspirin-acetaminophen-caffeine (EXCEDRIN EXTRA STRENGTH) [id number] MG per tablet Take by mouth every 6 (six) hours as needed for headache.    Marland Kitchen atorvastatin (LIPITOR) 40 MG tablet Take 1 tablet  by mouth at bedtime.     . B-D UF III MINI PEN NEEDLES 31G X 5 MM MISC Use as directed    . BIOTIN PO Take 1 capsule by mouth 2 (two) times daily.    . budesonide-formoterol (SYMBICORT) 160-4.5 MCG/ACT inhaler Inhale 2 puffs into the lungs 2 (two) times daily. 3 Inhaler 4  . [facility] IBUPROFEN PO Take by mouth as needed.    . cyanocobalamin (,VITAMIN B-12,) 1000 MCG/ML injection Inject 1,000 mcg into the muscle every 30 (thirty) days.    Marland Kitchen esomeprazole (NEXIUM) 40 MG capsule TAKE 1 CAPSULE TWICE A DAY 30 MINUTES PRIOR TO MEALS    . famotidine (PEPCID) 40  MG tablet TAKE 1 TABLET AT BEDTIME AS NEEDED TO CONTROL REFLUX 2X/WEEK   . furosemide (LASIX) 40 MG tablet Take 40 mg by mouth daily.     Marland Kitchen HUMALOG KWIKPEN 100 UNIT/ML SOPN Inject 14-18 Units into the skin 3 (three) times daily with meals. Based on SLIDING SCALE    . HYDROcodone-acetaminophen (NORCO/VICODIN) 5-325 MG per tablet Take 1 tablet by mouth every 8 (eight) hours as needed.    Marland Kitchen ipratropium-albuterol (DUONEB) 0.5-2.5 (3) MG/3ML SOLN 1 neb every 4-6 hours as needed for wheezing/shortness of breath    . LANTUS SOLOSTAR 100 UNIT/ML SOPN Inject 24 Units into the skin at bedtime.     . lidocaine (XYLOCAINE) 2 % solution 2 TSP  PO Q4-6H PRN FOR CHEST OR ABDOMINAL PAIN. NO MORE THAN 8 DOSES A DAY. WORKS BUT OUT OF IT   . metFORMIN (GLUCOPHAGE) 1000 MG tablet Take 1,000 mg by mouth 2 (two) times daily with a meal.    . metoprolol succinate (TOPROL-XL) 25 MG 24 hr tablet Take 50 mg by mouth daily.     . ondansetron (ZOFRAN) 4 MG tablet Take 1 tablet (4 mg total) by mouth every 8 (eight) hours as needed for nausea or vomiting. 1X/DAY IN AFTERNOON   . PHARMACIST CHOICE LANCETS MISC Use as directed      Review of Systems PER HPI OTHERWISE ALL SYSTEMS ARE NEGATIVE.     Objective:   Physical Exam  Constitutional: She is oriented to person, place, and time. She appears well-developed and well-nourished. No distress.  HENT:  Head: Normocephalic and atraumatic.  Mouth/Throat: Oropharynx is clear and moist. No oropharyngeal exudate.  Eyes: Pupils are equal, round, and reactive to light. No scleral icterus.  Neck: Normal range of motion. Neck supple.  Cardiovascular: Normal rate, regular rhythm and normal heart sounds.   Pulmonary/Chest: Effort normal and breath sounds normal. No respiratory distress.  Abdominal: Soft. Bowel sounds are normal. She exhibits no distension. There is no tenderness.  Musculoskeletal: She exhibits no edema.  Lymphadenopathy:    She has no cervical adenopathy.    Neurological: She is alert and oriented to person, place, and time.  NO  NEW FOCAL DEFICITS   Psychiatric:  FLAT AFFECT, NL MOOD   Vitals reviewed.         Assessment & Plan:

## 2015-03-29 NOTE — Assessment & Plan Note (Signed)
SYMPTOMS NOT CONTROLLED & MOST LIKEY DUE TO UNCONTROLLED GERD.   CONTINUE YOUR WEIGHT LOSS EFFORTS. LOSE 10 LBS AVOID FOOD THAT TRIGGERS REFLUX.  HANDOUT GIVEN. ADD ZOFRAN AND VISCOUS LIDOCAINE PRN. CONTINUE NEXIUM. TAKE 30 MINUTES BEFORE MEALS TWICE DAILY. PEPCID PRN FOLLOW UP IN 3 MOS.

## 2015-04-06 LAB — URINALYSIS, MICROSCOPIC ONLY
Bacteria, UA: NONE SEEN [HPF]
Casts: NONE SEEN [LPF]
Crystals: NONE SEEN [HPF]
RBC / HPF: NONE SEEN RBC/HPF (ref <=2)
Yeast: NONE SEEN [HPF]

## 2015-04-06 LAB — URINALYSIS, ROUTINE W REFLEX MICROSCOPIC
Bilirubin Urine: NEGATIVE
Glucose, UA: NEGATIVE
HGB URINE DIPSTICK: NEGATIVE
Ketones, ur: NEGATIVE
LEUKOCYTES UA: NEGATIVE
NITRITE: NEGATIVE
PH: 5.5 (ref 5.0–8.0)
SPECIFIC GRAVITY, URINE: 1.019 (ref 1.001–1.035)

## 2015-04-06 LAB — CLOSTRIDIUM DIFFICILE BY PCR

## 2015-04-06 LAB — GIARDIA ANTIGEN: Giardia Screen (EIA): NEGATIVE

## 2015-04-13 ENCOUNTER — Telehealth: Payer: Self-pay | Admitting: Gastroenterology

## 2015-04-13 NOTE — Telephone Encounter (Signed)
PLEASE CALL PT. HER STOOL STUDIES ARE NEGATIVE.   CONTINUE YOUR WEIGHT LOSS EFFORTS. LOSE 10 LBS.  AVOID FOOD THAT TRIGGERS REFLUX. SEE INFO BELOW.  ADD ZOFRAN AND VISCOUS LIDOCAINE AS NEEDED FOR NAUSEA/CHEST OR UPPER ABDOMINAL PAIN.  CONTINUE NEXIUM. TAKE 30 MINUTES BEFORE MEALS TWICE DAILY.  PEPCID HELPS MOST WHEN USED AS NEEDED.  CONTINUE VIBERZI.  FOLLOW UP IN 3 MOS.

## 2015-04-13 NOTE — Telephone Encounter (Signed)
LMOM to call.

## 2015-04-14 NOTE — Telephone Encounter (Signed)
On recall to follow up in 3 months

## 2015-04-14 NOTE — Telephone Encounter (Signed)
Mailed the printout with these results/instructions to pt.

## 2015-04-17 NOTE — Telephone Encounter (Signed)
Pt called and was given results verbally. ( Had not received the letter yet).

## 2015-05-11 ENCOUNTER — Other Ambulatory Visit: Payer: Self-pay | Admitting: Critical Care Medicine

## 2015-05-15 ENCOUNTER — Encounter: Payer: Self-pay | Admitting: Gastroenterology

## 2015-05-18 ENCOUNTER — Other Ambulatory Visit: Payer: Self-pay | Admitting: Critical Care Medicine

## 2015-06-23 ENCOUNTER — Other Ambulatory Visit: Payer: Self-pay | Admitting: Pulmonary Disease

## 2015-06-23 MED ORDER — FAMOTIDINE 40 MG PO TABS: ORAL_TABLET | ORAL | Status: DC

## 2015-06-27 ENCOUNTER — Observation Stay (HOSPITAL_COMMUNITY)
Admission: EM | Admit: 2015-06-27 | Discharge: 2015-06-28 | Disposition: A | Discharge status: Home / Self Care | Payer: Managed Care, Other (non HMO) | Attending: Internal Medicine | Admitting: Internal Medicine

## 2015-06-27 ENCOUNTER — Emergency Department (HOSPITAL_COMMUNITY): Payer: Managed Care, Other (non HMO)

## 2015-06-27 ENCOUNTER — Encounter (HOSPITAL_COMMUNITY): Payer: Self-pay | Admitting: Emergency Medicine

## 2015-06-27 DIAGNOSIS — E119 Type 2 diabetes mellitus without complications: Secondary | ICD-10-CM | POA: Diagnosis not present

## 2015-06-27 DIAGNOSIS — Z7982 Long term (current) use of aspirin: Secondary | ICD-10-CM | POA: Insufficient documentation

## 2015-06-27 DIAGNOSIS — K449 Diaphragmatic hernia without obstruction or gangrene: Secondary | ICD-10-CM | POA: Insufficient documentation

## 2015-06-27 DIAGNOSIS — E538 Deficiency of other specified B group vitamins: Secondary | ICD-10-CM | POA: Diagnosis not present

## 2015-06-27 DIAGNOSIS — G629 Polyneuropathy, unspecified: Secondary | ICD-10-CM | POA: Diagnosis not present

## 2015-06-27 DIAGNOSIS — M109 Gout, unspecified: Secondary | ICD-10-CM | POA: Insufficient documentation

## 2015-06-27 DIAGNOSIS — R51 Headache: Secondary | ICD-10-CM | POA: Insufficient documentation

## 2015-06-27 DIAGNOSIS — I10 Essential (primary) hypertension: Secondary | ICD-10-CM | POA: Diagnosis not present

## 2015-06-27 DIAGNOSIS — J449 Chronic obstructive pulmonary disease, unspecified: Secondary | ICD-10-CM | POA: Diagnosis not present

## 2015-06-27 DIAGNOSIS — K219 Gastro-esophageal reflux disease without esophagitis: Secondary | ICD-10-CM | POA: Diagnosis not present

## 2015-06-27 DIAGNOSIS — D509 Iron deficiency anemia, unspecified: Secondary | ICD-10-CM | POA: Diagnosis not present

## 2015-06-27 DIAGNOSIS — K589 Irritable bowel syndrome without diarrhea: Secondary | ICD-10-CM | POA: Diagnosis not present

## 2015-06-27 DIAGNOSIS — Z79899 Other long term (current) drug therapy: Secondary | ICD-10-CM | POA: Diagnosis not present

## 2015-06-27 DIAGNOSIS — G8929 Other chronic pain: Secondary | ICD-10-CM | POA: Diagnosis not present

## 2015-06-27 DIAGNOSIS — E1142 Type 2 diabetes mellitus with diabetic polyneuropathy: Secondary | ICD-10-CM | POA: Diagnosis not present

## 2015-06-27 DIAGNOSIS — D500 Iron deficiency anemia secondary to blood loss (chronic): Secondary | ICD-10-CM

## 2015-06-27 DIAGNOSIS — G4730 Sleep apnea, unspecified: Secondary | ICD-10-CM | POA: Diagnosis not present

## 2015-06-27 DIAGNOSIS — Z87891 Personal history of nicotine dependence: Secondary | ICD-10-CM | POA: Insufficient documentation

## 2015-06-27 DIAGNOSIS — R42 Dizziness and giddiness: Principal | ICD-10-CM | POA: Insufficient documentation

## 2015-06-27 DIAGNOSIS — H81399 Other peripheral vertigo, unspecified ear: Secondary | ICD-10-CM

## 2015-06-27 HISTORY — DX: Irritable bowel syndrome, unspecified: K58.9

## 2015-06-27 HISTORY — DX: Other chronic pain: G89.29

## 2015-06-27 HISTORY — DX: Dorsalgia, unspecified: M54.9

## 2015-06-27 HISTORY — DX: Radiculopathy, lumbar region: M54.16

## 2015-06-27 LAB — I-STAT TROPONIN, ED: Troponin i, poc: 0.01 ng/mL (ref 0.00–0.08)

## 2015-06-27 LAB — BASIC METABOLIC PANEL
Anion gap: 11 (ref 5–15)
BUN: 24 mg/dL — AB (ref 6–20)
CHLORIDE: 102 mmol/L (ref 101–111)
CO2: 26 mmol/L (ref 22–32)
CREATININE: 1.22 mg/dL — AB (ref 0.44–1.00)
Calcium: 8.3 mg/dL — ABNORMAL LOW (ref 8.9–10.3)
GFR calc Af Amer: 51 mL/min — ABNORMAL LOW (ref >60)
GFR calc non Af Amer: 44 mL/min — ABNORMAL LOW (ref >60)
Glucose, Bld: 149 mg/dL — ABNORMAL HIGH (ref 65–99)
Potassium: 4.7 mmol/L (ref 3.5–5.1)
SODIUM: 139 mmol/L (ref 135–145)

## 2015-06-27 LAB — CBC WITH DIFFERENTIAL/PLATELET
Basophils Absolute: 0 10*3/uL (ref 0.0–0.1)
Basophils Relative: 0 %
EOS ABS: 0.5 10*3/uL (ref 0.0–0.7)
EOS PCT: 4 %
HCT: 36.1 % (ref 36.0–46.0)
Hemoglobin: 11.5 g/dL — ABNORMAL LOW (ref 12.0–15.0)
LYMPHS ABS: 2.7 10*3/uL (ref 0.7–4.0)
Lymphocytes Relative: 23 %
MCH: 28.3 pg (ref 26.0–34.0)
MCHC: 31.9 g/dL (ref 30.0–36.0)
MCV: 88.9 fL (ref 78.0–100.0)
Monocytes Absolute: 0.8 10*3/uL (ref 0.1–1.0)
Monocytes Relative: 7 %
Neutro Abs: 7.7 10*3/uL (ref 1.7–7.7)
Neutrophils Relative %: 66 %
Platelets: 217 10*3/uL (ref 150–400)
RBC: 4.06 MIL/uL (ref 3.87–5.11)
RDW: 13.1 % (ref 11.5–15.5)
WBC: 11.8 10*3/uL — AB (ref 4.0–10.5)

## 2015-06-27 LAB — URINALYSIS, ROUTINE W REFLEX MICROSCOPIC
Bilirubin Urine: NEGATIVE
GLUCOSE, UA: NEGATIVE mg/dL
HGB URINE DIPSTICK: NEGATIVE
KETONES UR: NEGATIVE mg/dL
LEUKOCYTES UA: NEGATIVE
NITRITE: NEGATIVE
PH: 6.5 (ref 5.0–8.0)
PROTEIN: NEGATIVE mg/dL
Specific Gravity, Urine: 1.015 (ref 1.005–1.030)

## 2015-06-27 MED ORDER — IPRATROPIUM-ALBUTEROL 0.5-2.5 (3) MG/3ML IN SOLN: 3.0000 mL | RESPIRATORY_TRACT | Status: DC | PRN

## 2015-06-27 MED ORDER — ACETAMINOPHEN 325 MG PO TABS: 650.0000 mg | ORAL_TABLET | Freq: Four times a day (QID) | ORAL | Status: DC | PRN

## 2015-06-27 MED ORDER — AMITRIPTYLINE HCL 25 MG PO TABS
100.0000 mg | ORAL_TABLET | Freq: Every day | ORAL | Status: DC
  Administered 2015-06-28: 100 mg via ORAL
  Filled 2015-06-27: qty 4

## 2015-06-27 MED ORDER — BUDESONIDE-FORMOTEROL FUMARATE 160-4.5 MCG/ACT IN AERO
2.0000 | INHALATION_SPRAY | Freq: Two times a day (BID) | RESPIRATORY_TRACT | Status: DC
  Administered 2015-06-28: 2 via RESPIRATORY_TRACT
  Filled 2015-06-27: qty 6

## 2015-06-27 MED ORDER — SODIUM CHLORIDE 0.9 % IV SOLN
INTRAVENOUS | Status: DC
  Administered 2015-06-27 – 2015-06-28 (×2): via INTRAVENOUS

## 2015-06-27 MED ORDER — MECLIZINE HCL 12.5 MG PO TABS
25.0000 mg | ORAL_TABLET | Freq: Three times a day (TID) | ORAL | Status: DC
  Administered 2015-06-28 (×2): 25 mg via ORAL
  Filled 2015-06-27 (×2): qty 2

## 2015-06-27 MED ORDER — INSULIN GLARGINE 100 UNIT/ML ~~LOC~~ SOLN
26.0000 [IU] | Freq: Every day | SUBCUTANEOUS | Status: DC
  Administered 2015-06-28: 26 [IU] via SUBCUTANEOUS
  Filled 2015-06-27 (×3): qty 0.26

## 2015-06-27 MED ORDER — ALLOPURINOL 100 MG PO TABS
100.0000 mg | ORAL_TABLET | Freq: Two times a day (BID) | ORAL | Status: DC
  Administered 2015-06-28: 100 mg via ORAL
  Filled 2015-06-27 (×2): qty 1

## 2015-06-27 MED ORDER — ATORVASTATIN CALCIUM 40 MG PO TABS
40.0000 mg | ORAL_TABLET | Freq: Every day | ORAL | Status: DC
  Administered 2015-06-28: 40 mg via ORAL
  Filled 2015-06-27: qty 1

## 2015-06-27 MED ORDER — MECLIZINE HCL 12.5 MG PO TABS
25.0000 mg | ORAL_TABLET | Freq: Once | ORAL | Status: AC
  Administered 2015-06-27: 25 mg via ORAL
  Filled 2015-06-27: qty 2

## 2015-06-27 MED ORDER — ACETAMINOPHEN 650 MG RE SUPP: 650.0000 mg | Freq: Four times a day (QID) | RECTAL | Status: DC | PRN

## 2015-06-27 MED ORDER — ONDANSETRON HCL 4 MG PO TABS: 4.0000 mg | ORAL_TABLET | Freq: Four times a day (QID) | ORAL | Status: DC | PRN

## 2015-06-27 MED ORDER — HYDROCODONE-ACETAMINOPHEN 5-325 MG PO TABS: 1.0000 | ORAL_TABLET | Freq: Three times a day (TID) | ORAL | Status: DC | PRN

## 2015-06-27 MED ORDER — ONDANSETRON HCL 4 MG/2ML IJ SOLN: 4.0000 mg | Freq: Four times a day (QID) | INTRAMUSCULAR | Status: DC | PRN

## 2015-06-27 MED ORDER — ASPIRIN EC 81 MG PO TBEC
81.0000 mg | DELAYED_RELEASE_TABLET | Freq: Every day | ORAL | Status: DC
  Administered 2015-06-28: 81 mg via ORAL
  Filled 2015-06-27: qty 1

## 2015-06-27 MED ORDER — PANTOPRAZOLE SODIUM 40 MG PO TBEC
40.0000 mg | DELAYED_RELEASE_TABLET | Freq: Every day | ORAL | Status: DC
  Administered 2015-06-28: 40 mg via ORAL
  Filled 2015-06-27: qty 1

## 2015-06-27 MED ORDER — INSULIN ASPART 100 UNIT/ML ~~LOC~~ SOLN
0.0000 [IU] | Freq: Three times a day (TID) | SUBCUTANEOUS | Status: DC
  Administered 2015-06-28: 1 [IU] via SUBCUTANEOUS

## 2015-06-27 MED ORDER — SODIUM CHLORIDE 0.9 % IV SOLN
INTRAVENOUS | Status: AC
  Administered 2015-06-28: 01:00:00 via INTRAVENOUS

## 2015-06-27 MED ORDER — HYDRALAZINE HCL 25 MG PO TABS
25.0000 mg | ORAL_TABLET | Freq: Four times a day (QID) | ORAL | Status: DC | PRN
  Administered 2015-06-28: 25 mg via ORAL
  Filled 2015-06-27: qty 1

## 2015-06-27 MED ORDER — DIAZEPAM 2 MG PO TABS
2.0000 mg | ORAL_TABLET | Freq: Once | ORAL | Status: AC
  Administered 2015-06-27: 2 mg via ORAL
  Filled 2015-06-27: qty 1

## 2015-06-27 MED ORDER — METOPROLOL SUCCINATE ER 50 MG PO TB24
50.0000 mg | ORAL_TABLET | Freq: Every day | ORAL | Status: DC
Start: 2015-06-28 — End: 2015-06-28
  Administered 2015-06-28: 50 mg via ORAL
  Filled 2015-06-27: qty 1

## 2015-06-27 MED ORDER — ENOXAPARIN SODIUM 40 MG/0.4ML ~~LOC~~ SOLN
40.0000 mg | SUBCUTANEOUS | Status: DC
  Administered 2015-06-28: 40 mg via SUBCUTANEOUS
  Filled 2015-06-27: qty 0.4

## 2015-06-27 NOTE — ED Notes (Signed)
IV attempted twice, unsuccessful.

## 2015-06-27 NOTE — ED Notes (Signed)
MD at bedside. 

## 2015-06-27 NOTE — ED Notes (Signed)
Ambulated patient around nursing station. Patient was a little dizzy but had no other issues.

## 2015-06-27 NOTE — ED Notes (Signed)
Pt reports headache,dizziness, neck pain and unsteady gait x1 week. Pt alert and oriented. nad noted.

## 2015-06-27 NOTE — ED Provider Notes (Signed)
CSN: [id number]     Arrival date & time 06/27/15  1805 History   First MD Initiated Contact with Patient 06/27/15 1826     Chief Complaint  Patient presents with  . Headache  . Dizziness      HPI Pt was seen at 1830.  Per pt, c/o gradual onset and worsening of persistent "dizziness" since yesterday. Pt describes the dizziness as "everything is moving around." Dizziness worsens with ambulation and movement of her head or eyes side-to-side. Has been associated with unsteady gait, frontal headache, and right sided neck "pain." Denies injury, no sore throat, no abd pain, no N/V/D, no CP/palpitations, no SOB/cough, no visual changes, no focal motor weakness, no tingling/numbness in extremities, no slurred speech, no facial droop.     Past Medical History  Diagnosis Date  . COPD (chronic obstructive pulmonary disease) ([place])   . Degenerative disc disease   . Vitamin B12 deficiency   . Iron deficiency anemia   . Sleep apnea   . Essential hypertension, benign   . Mixed hyperlipidemia   . Type 2 diabetes mellitus ([place])   . History of hiatal hernia   . History of diverticulitis of colon   . GERD (gastroesophageal reflux disease)   . Gout   . Neuropathy ([place])   . Hiatal hernia 07/27/2013  . Irritable bowel syndrome   . Chronic back pain   . Lumbar radiculopathy    Past Surgical History  Procedure Laterality Date  . Esophagogastroduodenoscopy  11/19/2006    SLF: Large hiatal hernia without evidence of Cameron ulcers/. Distal esophageal stricture, which allowed the gastroscope to pass without resistance.  A 16 mm Savary later passed with mild resistance/ Normal stomach.sb bx negative  . Colonoscopy  10/01/2006    SLF:Pan colonic diverticulosis and moderate internal hemorrhoids/ Otherwise no polyps, masses, inflammatory changes or AVMs/  . Cholecystectomy    . Partial hysterectomy  1978  . Tonsillectomy and adenoidectomy    . Umbilical hernia repair  2010  . Benign breast cysts    .  Two back surgeries/fusion    . Abdominal hysterectomy    . Back surgery    . Esophagogastroduodenoscopy  10/01/2006    [id number] hiatal hernia.  Distal esophagus without evidence of   erythema, ulceration or Barrett's esophagus  . Esophagogastroduodenoscopy  2011    SLF: large hh, distal esophageal web narrowing to 10mm s/p dilation to 16mm  . Colonoscopy  2011    SLF: pancolonic diverticulosis, large internal hemorrhoids  . Small bowel capsule  2008    negative  . Esophagogastroduodenoscopy N/A 08/06/2013    SLF: 1. Stricture at the gastroesophageal junction 2. large hiatal hernia. 3. Mild erosive gastritis.  . Givens capsule study N/A 08/06/2013    INCOMPLETE-SMALL BOWLE ULCERS   Family History  Problem Relation Age of Onset  . Colon cancer Brother   . Ulcers Sister   . Diabetes Sister   . Heart attack Sister   . Kidney failure Sister   . Stroke Sister   . Ulcers Mother   . Diabetes Mother   . Heart attack Mother   . Stroke Mother   . Asthma Mother   . Heart attack Brother   . Asthma Sister   . Diabetes Brother   . Stroke Maternal Grandmother   . Heart attack Maternal Grandmother   . Heart attack Other    Social History  Substance Use Topics  . Smoking status: Former Smoker -- 1.00 packs/day for 1 years  Types: Cigarettes    Quit date: 08/05/1961  . Smokeless tobacco: Former Systems developer    Quit date: 06/30/1962     Comment: Quit smoking x 50 years  . Alcohol Use: No    Review of Systems ROS: Statement: All systems negative except as marked or noted in the HPI; Constitutional: Negative for fever and chills. ; ; Eyes: Negative for eye pain, redness and discharge. ; ; ENMT: Negative for ear pain, hoarseness, nasal congestion, sinus pressure and sore throat. ; ; Cardiovascular: Negative for chest pain, palpitations, diaphoresis, dyspnea and peripheral edema. ; ; Respiratory: Negative for cough, wheezing and stridor. ; ; Gastrointestinal: Negative for nausea, vomiting, diarrhea,  abdominal pain, blood in stool, hematemesis, jaundice and rectal bleeding. . ; ; Genitourinary: Negative for dysuria, flank pain and hematuria. ; ; Musculoskeletal: +neck pain. Negative for back pain. Negative for swelling and trauma.; ; Skin: Negative for pruritus, rash, abrasions, blisters, bruising and skin lesion.; ; Neuro: +"dizziness," frontal headache. Negative for headache, lightheadedness and neck stiffness. Negative for weakness, altered level of consciousness , altered mental status, extremity weakness, paresthesias, involuntary movement, seizure and syncope.     Allergies  Review of patient's allergies indicates no known allergies.  Home Medications   Prior to Admission medications   Medication Sig Start Date End Date Taking? Authorizing Provider  allopurinol (ZYLOPRIM) 100 MG tablet Take 1 tablet by mouth 2 (two) times daily. 10/09/12  Yes Historical Provider, MD  amitriptyline (ELAVIL) 100 MG tablet Take 100 mg by mouth at bedtime.     Yes Historical Provider, MD  aspirin EC 81 MG tablet Take 81 mg by mouth daily.   Yes Historical Provider, MD  aspirin-acetaminophen-caffeine ([place]) [phone] MG per tablet Take by mouth every 6 (six) hours as needed for headache.   Yes Historical Provider, MD  atorvastatin (LIPITOR) 40 MG tablet Take 1 tablet by mouth at bedtime.  10/09/12  Yes Historical Provider, MD  BIOTIN PO Take 1 capsule by mouth daily.    Yes Historical Provider, MD  budesonide-formoterol (SYMBICORT) 160-4.5 MCG/ACT inhaler Inhale 2 puffs into the lungs 2 (two) times daily. 03/07/14  Yes Elsie Stain, MD  cyanocobalamin (,VITAMIN B-12,) 1000 MCG/ML injection Inject 1,000 mcg into the muscle every 30 (thirty) days.   Yes Historical Provider, MD  Eluxadoline (VIBERZI) 75 MG TABS Take 75 mg by mouth 2 (two) times daily. 03/29/15  Yes [name], MD  esomeprazole (NEXIUM) 40 MG capsule TAKE 1 CAPSULE TWICE A DAY 30 MINUTES PRIOR TO MEALS 01/30/15  Yes Mahala Menghini, PA-C  famotidine (PEPCID) 40 MG tablet TAKE 1 TABLET AT BEDTIME AS NEEDED TO CONTROL REFLUX Patient taking differently: Take 40 mg by mouth at bedtime as needed for heartburn or indigestion.  06/23/15  Yes Praveen Mannam, MD  furosemide (LASIX) 40 MG tablet Take 40 mg by mouth daily.    Yes Historical Provider, MD  HUMALOG KWIKPEN 100 UNIT/ML SOPN Inject 14-18 Units into the skin 3 (three) times daily with meals. Based on SLIDING SCALE 10/06/12  Yes Historical Provider, MD  HYDROcodone-acetaminophen (NORCO/VICODIN) 5-325 MG per tablet Take 1 tablet by mouth every 8 (eight) hours as needed for moderate pain.    Yes Historical Provider, MD  ipratropium-albuterol (DUONEB) 0.5-2.5 (3) MG/3ML SOLN 1 neb every 4-6 hours as needed for wheezing/shortness of breath Patient taking differently: Inhale 3 mLs into the lungs every 4 (four) hours as needed. 1 neb every 4-6 hours as needed for wheezing/shortness of  breath 03/07/14  Yes Elsie Stain, MD  LANTUS SOLOSTAR 100 UNIT/ML SOPN Inject 26 Units into the skin at bedtime.  10/06/12  Yes Historical Provider, MD  metFORMIN (GLUCOPHAGE) 1000 MG tablet Take 1,000 mg by mouth 2 (two) times daily with a meal.   Yes Historical Provider, MD  metoprolol succinate (TOPROL-XL) 25 MG 24 hr tablet Take 50 mg by mouth daily.    Yes Historical Provider, MD  ondansetron (ZOFRAN) 4 MG tablet Take 1 tablet (4 mg total) by mouth every 8 (eight) hours as needed for nausea or vomiting. 03/29/15  Yes [name], MD   BP 126/46 mmHg  Pulse 96  Temp(Src) 98 F (36.7 C) (Oral)  Resp 20  Ht 4\' 11"  (1.499 m)  Wt 225 lb (102.059 kg)  BMI 45.42 kg/m2  SpO2 93%   20:16 Orthostatic Vital Signs LB  Orthostatic Lying  - BP- Lying: 190/82 mmHg ; Pulse- Lying: 108  Orthostatic Sitting - BP- Sitting: 193/86 mmHg ; Pulse- Sitting: 110  Orthostatic Standing at 0 minutes - BP- Standing at 0 minutes: 202/86 mmHg ; Pulse- Standing at 0 minutes: 115      Physical Exam   1835: Physical examination:  Nursing notes reviewed; Vital signs and O2 SAT reviewed;  Constitutional: Well developed, Well nourished, Well hydrated, In no acute distress; Head:  Normocephalic, atraumatic; Eyes: EOMI, PERRL, No scleral icterus; ENMT: TM's clear bilat. +edemetous nasal turbinates bilat with clear rhinorrhea. Mouth and pharynx normal, Mucous membranes moist; Neck: Supple, Full range of motion, No lymphadenopathy; Cardiovascular: Regular rate and rhythm, No gallop; Respiratory: Breath sounds clear & equal bilaterally, No wheezes.  Speaking full sentences with ease, Normal respiratory effort/excursion; Chest: Nontender, Movement normal; Abdomen: Soft, Nontender, Nondistended, Normal bowel sounds; Genitourinary: No CVA tenderness; Spine:  No midline CS, TS, LS tenderness. +TTP right cervical paraspinal muscles. No rash.;; Extremities: Pulses normal, No tenderness, No edema, No calf edema or asymmetry.; Neuro: AA&Ox3, Major CN grossly intact. Speech clear.  No facial droop.  +left horizontal end gaze fatigable nystagmus. Grips equal. Strength 5/5 equal bilat UE's and LE's.  DTR 2/4 equal bilat UE's and LE's.  No gross sensory deficits.  Normal cerebellar testing bilat UE's (finger-nose) and LE's (heel-shin).; Skin: Color normal, Warm, Dry.    ED Course  Procedures (including critical care time) Labs Review   Imaging Review  I have personally reviewed and evaluated these images and lab results as part of my medical decision-making.   EKG Interpretation   Date/Time:  Tuesday June 27 2015 18:23:44 EST Ventricular Rate:  110 PR Interval:  146 QRS Duration: 104 QT Interval:  325 QTC Calculation: 440 R Axis:   9 Text Interpretation:  Sinus tachycardia Nonspecific ST and T wave  abnormality Lateral leads Baseline wander Artifact When compared with ECG  of 06/30/2013 Nonspecific ST and T wave abnormality is now Present  Confirmed by [facility]  MD, Nunzio Cory [phone]) on 06/27/2015  10:01:26 PM      MDM  MDM Reviewed: previous chart, nursing note and vitals Reviewed previous: labs and ECG Interpretation: labs, ECG, MRI and CT scan      Results for orders placed or performed during the hospital encounter of [id number]  Basic metabolic panel  Result Value Ref Range   Sodium 139 135 - 145 mmol/L   Potassium 4.7 3.5 - 5.1 mmol/L   Chloride 102 101 - 111 mmol/L   CO2 26 22 - 32 mmol/L   Glucose, Bld 149 (H) 65 -  99 mg/dL   BUN 24 (H) 6 - 20 mg/dL   Creatinine, Ser 1.22 (H) 0.44 - 1.00 mg/dL   Calcium 8.3 (L) 8.9 - 10.3 mg/dL   GFR calc non Af Amer 44 (L) >60 mL/min   GFR calc Af Amer 51 (L) >60 mL/min   Anion gap 11 5 - 15  CBC with Differential  Result Value Ref Range   WBC 11.8 (H) 4.0 - 10.5 K/uL   RBC 4.06 3.87 - 5.11 MIL/uL   Hemoglobin 11.5 (L) 12.0 - 15.0 g/dL   HCT 36.1 36.0 - 46.0 %   MCV 88.9 78.0 - 100.0 fL   MCH 28.3 26.0 - 34.0 pg   MCHC 31.9 30.0 - 36.0 g/dL   RDW 13.1 11.5 - 15.5 %   Platelets 217 150 - 400 K/uL   Neutrophils Relative % 66 %   Neutro Abs 7.7 1.7 - 7.7 K/uL   Lymphocytes Relative 23 %   Lymphs Abs 2.7 0.7 - 4.0 K/uL   Monocytes Relative 7 %   Monocytes Absolute 0.8 0.1 - 1.0 K/uL   Eosinophils Relative 4 %   Eosinophils Absolute 0.5 0.0 - 0.7 K/uL   Basophils Relative 0 %   Basophils Absolute 0.0 0.0 - 0.1 K/uL  Urinalysis, Routine w reflex microscopic  Result Value Ref Range   Color, Urine YELLOW YELLOW   APPearance CLEAR CLEAR   Specific Gravity, Urine 1.015 1.005 - 1.030   pH 6.5 5.0 - 8.0   Glucose, UA NEGATIVE NEGATIVE mg/dL   Hgb urine dipstick NEGATIVE NEGATIVE   Bilirubin Urine NEGATIVE NEGATIVE   Ketones, ur NEGATIVE NEGATIVE mg/dL   Protein, ur NEGATIVE NEGATIVE mg/dL   Nitrite NEGATIVE NEGATIVE   Leukocytes, UA NEGATIVE NEGATIVE  I-stat troponin, ED  Result Value Ref Range   Troponin i, poc 0.01 0.00 - 0.08 ng/mL   Comment 3           Ct Head Wo Contrast 06/27/2015  CLINICAL DATA:  Headache,  dizziness, neck pain, unsteady gait for 1 week. EXAM: CT HEAD WITHOUT CONTRAST CT CERVICAL SPINE WITHOUT CONTRAST TECHNIQUE: Multidetector CT imaging of the head and cervical spine was performed following the standard protocol without intravenous contrast. Multiplanar CT image reconstructions of the cervical spine were also generated. COMPARISON:  None. FINDINGS: CT HEAD FINDINGS Age related brain atrophy and minor periventricular white matter microvascular ischemic changes. No acute intracranial hemorrhage, infarction, mass lesion, focal mass effect or edema. No hydrocephalus, midline shift, herniation, or extra-axial fluid collection. Normal gray-white matter differentiation. Cisterns are patent. Cerebellar atrophy as well. Orbits are symmetric. Mastoids and sinuses remain clear. Skull intact. CT CERVICAL SPINE FINDINGS Bones are osteopenic. Diffuse cervical degenerative disc disease and spondylosis at all levels most pronounced from C3-4 to C7-T1. Normal prevertebral soft tissues. Intact odontoid. Facets are aligned. No acute osseous finding or fracture. No compression deformity or focal kyphosis. No soft tissue asymmetry in the neck or hematoma. Lung apices remain clear. IMPRESSION: No acute intracranial finding. Mild brain atrophy and chronic white matter microvascular ischemic changes No acute cervical spine fracture or malalignment. Cervical spondylosis and degenerative changes. Electronically Signed   By: Jerilynn Mages.  Shick M.D.   On: 06/27/2015 19:19   Ct Cervical Spine Wo Contrast 06/27/2015  CLINICAL DATA:  Headache, dizziness, neck pain, unsteady gait for 1 week. EXAM: CT HEAD WITHOUT CONTRAST CT CERVICAL SPINE WITHOUT CONTRAST TECHNIQUE: Multidetector CT imaging of the head and cervical spine was performed following the standard protocol without  intravenous contrast. Multiplanar CT image reconstructions of the cervical spine were also generated. COMPARISON:  None. FINDINGS: CT HEAD FINDINGS Age related brain  atrophy and minor periventricular white matter microvascular ischemic changes. No acute intracranial hemorrhage, infarction, mass lesion, focal mass effect or edema. No hydrocephalus, midline shift, herniation, or extra-axial fluid collection. Normal gray-white matter differentiation. Cisterns are patent. Cerebellar atrophy as well. Orbits are symmetric. Mastoids and sinuses remain clear. Skull intact. CT CERVICAL SPINE FINDINGS Bones are osteopenic. Diffuse cervical degenerative disc disease and spondylosis at all levels most pronounced from C3-4 to C7-T1. Normal prevertebral soft tissues. Intact odontoid. Facets are aligned. No acute osseous finding or fracture. No compression deformity or focal kyphosis. No soft tissue asymmetry in the neck or hematoma. Lung apices remain clear. IMPRESSION: No acute intracranial finding. Mild brain atrophy and chronic white matter microvascular ischemic changes No acute cervical spine fracture or malalignment. Cervical spondylosis and degenerative changes. Electronically Signed   By: Jerilynn Mages.  Shick M.D.   On: 06/27/2015 19:19   Mr Brain Wo Contrast (neuro Protocol) 06/27/2015  CLINICAL DATA:  Initial valuation for headaches with dizziness. EXAM: MRI HEAD WITHOUT CONTRAST TECHNIQUE: Multiplanar, multiecho pulse sequences of the brain and surrounding structures were obtained without intravenous contrast. COMPARISON:  Prior head CT from earlier the same day. FINDINGS: Mild generalized age-related cerebral atrophy is present. Patchy T2/FLAIR hyperintensity within the periventricular and deep white matter both cerebral hemispheres noted, nonspecific, but most likely related to mild chronic small vessel ischemic disease. Similar changes are seen within the pons. No abnormal foci of restricted diffusion to suggest acute intracranial infarct. Gray-white matter differentiation maintained. Normal intravascular flow voids are preserved. No acute or chronic intracranial hemorrhage. No mass  lesion, midline shift, or mass effect. No hydrocephalus. No extra-axial fluid collection. Craniocervical junction within normal limits. Incidental note made of a partially empty sella. No acute abnormality about the orbits. Paranasal sinuses are clear. No mastoid effusion. Inner ear structures normal. Bone marrow signal intensity within normal limits. Scalp soft tissues unremarkable. IMPRESSION: 1. No acute intracranial process. 2. Age-related cerebral volume loss with mild chronic small vessel ischemic disease. Electronically Signed   By: Jeannine Boga M.D.   On: 06/27/2015 21:58    2245:  Pt given antivert shortly after arrival but pt still unable to ambulate without heavy assist x2 due to continued "dizziness." MRI brain ordered and was negative for acute stroke. BUN/Cr per baseline. Will dose IVF, PO valium, and observation admit for symptom control. Dx and testing d/w pt and family.  Questions answered.  Verb understanding, agreeable to observation admit.  T/C to [facility] Dr. Darrick Meigs, case discussed, including:  HPI, pertinent PM/SHx, VS/PE, dx testing, ED course and treatment:  Agreeable to admit, requests to write temporary orders, obtain medical bed to team APAdmits.   Francine Graven, DO 06/29/15 1345

## 2015-06-27 NOTE — H&P (Signed)
PCP:   [name], MD   Chief Complaint:  Dizziness  HPI:  69 yr old  female who  has a past medical history of COPD (chronic obstructive pulmonary disease) ([place]); Degenerative disc disease; Vitamin B12 deficiency; Iron deficiency anemia; Sleep apnea; Essential hypertension, benign; Mixed hyperlipidemia; Type 2 diabetes mellitus ([place]); History of hiatal hernia; History of diverticulitis of colon; GERD (gastroesophageal reflux disease); Gout; Neuropathy ([place]); Hiatal hernia (07/27/2013); Irritable bowel syndrome; Chronic back pain; and Lumbar radiculopathy. Today came to the hospital with Plans of Dizziness and Headache for past 2 Days. Patient Says That She Has Been Having Headache for past Few Days but Started Having Dizziness 2 Days Ago. Patient Was Unable to Walk so She Came to the ED for Further Evaluation. She Denies Nausea and Vomiting or Diarrhea. Patient Has a History of IBS  and Started Taking New Medication  "Viberzi" prescribed by her PCP few weeks ago. Patient had bowel movement last night. She denies passing out, no chest pain or shortness of breath. Denies blurred vision. No weakness of extremities. No slurred speech. In the ED CT head and CT cervical spine were done which did not show any significant changes. MRI brain was also performed which was negative for stroke. Patient continues to have dizziness and was admitted for further evaluation.  Allergies:  No Known Allergies    Past Medical History  Diagnosis Date  . COPD (chronic obstructive pulmonary disease) ([place])   . Degenerative disc disease   . Vitamin B12 deficiency   . Iron deficiency anemia   . Sleep apnea   . Essential hypertension, benign   . Mixed hyperlipidemia   . Type 2 diabetes mellitus ([place])   . History of hiatal hernia   . History of diverticulitis of colon   . GERD (gastroesophageal reflux disease)   . Gout   . Neuropathy ([place])   . Hiatal hernia 07/27/2013  . Irritable bowel syndrome   .  Chronic back pain   . Lumbar radiculopathy     Past Surgical History  Procedure Laterality Date  . Esophagogastroduodenoscopy  11/19/2006    SLF: Large hiatal hernia without evidence of [place] ulcers/. Distal esophageal stricture, which allowed the gastroscope to pass without resistance.  A 16 mm Savary later passed with mild resistance/ Normal stomach.sb bx negative  . Colonoscopy  10/01/2006    SLF:Pan colonic diverticulosis and moderate internal hemorrhoids/ Otherwise no polyps, masses, inflammatory changes or AVMs/  . Cholecystectomy    . Partial hysterectomy  1978  . Tonsillectomy and adenoidectomy    . Umbilical hernia repair  2010  . Benign breast cysts    . Two back surgeries/fusion    . Abdominal hysterectomy    . Back surgery    . Esophagogastroduodenoscopy  10/01/2006    [id number] hiatal hernia.  Distal esophagus without evidence of   erythema, ulceration or Barrett's esophagus  . Esophagogastroduodenoscopy  2011    SLF: large hh, distal esophageal web narrowing to 78mm s/p dilation to 47mm  . Colonoscopy  2011    SLF: pancolonic diverticulosis, large internal hemorrhoids  . Small bowel capsule  2008    negative  . Esophagogastroduodenoscopy N/A 08/06/2013    SLF: 1. Stricture at the gastroesophageal junction 2. large hiatal hernia. 3. Mild erosive gastritis.  . [place] capsule study N/A 08/06/2013    INCOMPLETE-SMALL BOWLE ULCERS    Prior to Admission medications   Medication Sig Start Date End Date Taking? Authorizing Provider  allopurinol (ZYLOPRIM) 100  MG tablet Take 1 tablet by mouth 2 (two) times daily. 10/09/12  Yes Historical Provider, MD  amitriptyline (ELAVIL) 100 MG tablet Take 100 mg by mouth at bedtime.     Yes Historical Provider, MD  aspirin EC 81 MG tablet Take 81 mg by mouth daily.   Yes Historical Provider, MD  aspirin-acetaminophen-caffeine ([place]) [phone] MG per tablet Take by mouth every 6 (six) hours as needed for headache.   Yes  Historical Provider, MD  atorvastatin (LIPITOR) 40 MG tablet Take 1 tablet by mouth at bedtime.  10/09/12  Yes Historical Provider, MD  BIOTIN PO Take 1 capsule by mouth daily.    Yes Historical Provider, MD  budesonide-formoterol (SYMBICORT) 160-4.5 MCG/ACT inhaler Inhale 2 puffs into the lungs 2 (two) times daily. 03/07/14  Yes Elsie Stain, MD  cyanocobalamin (,VITAMIN B-12,) 1000 MCG/ML injection Inject 1,000 mcg into the muscle every 30 (thirty) days.   Yes Historical Provider, MD  Eluxadoline (VIBERZI) 75 MG TABS Take 75 mg by mouth 2 (two) times daily. 03/29/15  Yes [name], MD  esomeprazole (NEXIUM) 40 MG capsule TAKE 1 CAPSULE TWICE A DAY 30 MINUTES PRIOR TO MEALS 01/30/15  Yes Mahala Menghini, PA-C  famotidine (PEPCID) 40 MG tablet TAKE 1 TABLET AT BEDTIME AS NEEDED TO CONTROL REFLUX Patient taking differently: Take 40 mg by mouth at bedtime as needed for heartburn or indigestion.  06/23/15  Yes Praveen Mannam, MD  furosemide (LASIX) 40 MG tablet Take 40 mg by mouth daily.    Yes Historical Provider, MD  HUMALOG KWIKPEN 100 UNIT/ML SOPN Inject 14-18 Units into the skin 3 (three) times daily with meals. Based on SLIDING SCALE 10/06/12  Yes Historical Provider, MD  HYDROcodone-acetaminophen (NORCO/VICODIN) 5-325 MG per tablet Take 1 tablet by mouth every 8 (eight) hours as needed for moderate pain.    Yes Historical Provider, MD  ipratropium-albuterol (DUONEB) 0.5-2.5 (3) MG/3ML SOLN 1 neb every 4-6 hours as needed for wheezing/shortness of breath Patient taking differently: Inhale 3 mLs into the lungs every 4 (four) hours as needed. 1 neb every 4-6 hours as needed for wheezing/shortness of breath 03/07/14  Yes Elsie Stain, MD  LANTUS SOLOSTAR 100 UNIT/ML SOPN Inject 26 Units into the skin at bedtime.  10/06/12  Yes Historical Provider, MD  metFORMIN (GLUCOPHAGE) 1000 MG tablet Take 1,000 mg by mouth 2 (two) times daily with a meal.   Yes Historical Provider, MD  metoprolol succinate  (TOPROL-XL) 25 MG 24 hr tablet Take 50 mg by mouth daily.    Yes Historical Provider, MD  ondansetron (ZOFRAN) 4 MG tablet Take 1 tablet (4 mg total) by mouth every 8 (eight) hours as needed for nausea or vomiting. 03/29/15  Yes [name], MD    Social History:  reports that she quit smoking about 53 years ago. Her smoking use included Cigarettes. She has a 1 pack-year smoking history. She quit smokeless tobacco use about 53 years ago. She reports that she does not drink alcohol or use illicit drugs.  Family History  Problem Relation Age of Onset  . Colon cancer Brother   . Ulcers Sister   . Diabetes Sister   . Heart attack Sister   . Kidney failure Sister   . Stroke Sister   . Ulcers Mother   . Diabetes Mother   . Heart attack Mother   . Stroke Mother   . Asthma Mother   . Heart attack Brother   . Asthma  Sister   . Diabetes Brother   . Stroke Maternal Grandmother   . Heart attack Maternal Grandmother   . Heart attack Other     Filed Weights   06/27/15 1812  Weight: 102.059 kg (225 lb)    All the positives are listed in BOLD  Review of Systems:  HEENT: Headache, blurred vision, runny nose, sore throat Neck: Hypothyroidism, hyperthyroidism,,lymphadenopathy Chest : Shortness of breath, history of COPD, Asthma Heart : Chest pain, history of coronary arterey disease GI:  Nausea, vomiting, diarrhea, constipation, GERD GU: Dysuria, urgency, frequency of urination, hematuria Neuro: Stroke, seizures, syncope Psych: Depression, anxiety, hallucinations   Physical Exam: Blood pressure 126/46, pulse 96, temperature 98 F (36.7 C), temperature source Oral, resp. rate 20, height 4\' 11"  (1.499 m), weight 102.059 kg (225 lb), SpO2 93 %. Constitutional:   Patient is a well-developed and well-nourished female* in no acute distress and cooperative with exam. Head: Normocephalic and atraumatic Mouth: Mucus membranes moist Eyes: PERRL, EOMI, conjunctivae normal Neck: Supple, No  Thyromegaly Cardiovascular: RRR, S1 normal, S2 normal Pulmonary/Chest: CTAB, no wheezes, rales, or rhonchi Abdominal: Soft. Non-tender, non-distended, bowel sounds are normal, no masses, organomegaly, or guarding present.  Neurological: A&O x3, Strength is normal and symmetric bilaterally, cranial nerve II-XII are grossly intact, no focal motor deficit, sensory intact to light touch bilaterally.  Extremities : No Cyanosis, Clubbing or Edema  Labs on Admission:  Basic Metabolic Panel:  Recent Labs Lab 06/27/15 1958  NA 139  K 4.7  CL 102  CO2 26  GLUCOSE 149*  BUN 24*  CREATININE 1.22*  CALCIUM 8.3*   Liver Function Tests: No results for input(s): AST, ALT, ALKPHOS, BILITOT, PROT, ALBUMIN in the last 168 hours. No results for input(s): LIPASE, AMYLASE in the last 168 hours. No results for input(s): AMMONIA in the last 168 hours. CBC:  Recent Labs Lab 06/27/15 1958  WBC 11.8*  NEUTROABS 7.7  HGB 11.5*  HCT 36.1  MCV 88.9  PLT 217     Radiological Exams on Admission: Ct Head Wo Contrast  06/27/2015  CLINICAL DATA:  Headache, dizziness, neck pain, unsteady gait for 1 week. EXAM: CT HEAD WITHOUT CONTRAST CT CERVICAL SPINE WITHOUT CONTRAST TECHNIQUE: Multidetector CT imaging of the head and cervical spine was performed following the standard protocol without intravenous contrast. Multiplanar CT image reconstructions of the cervical spine were also generated. COMPARISON:  None. FINDINGS: CT HEAD FINDINGS Age related brain atrophy and minor periventricular white matter microvascular ischemic changes. No acute intracranial hemorrhage, infarction, mass lesion, focal mass effect or edema. No hydrocephalus, midline shift, herniation, or extra-axial fluid collection. Normal gray-white matter differentiation. Cisterns are patent. Cerebellar atrophy as well. Orbits are symmetric. Mastoids and sinuses remain clear. Skull intact. CT CERVICAL SPINE FINDINGS Bones are osteopenic. Diffuse  cervical degenerative disc disease and spondylosis at all levels most pronounced from C3-4 to C7-T1. Normal prevertebral soft tissues. Intact odontoid. Facets are aligned. No acute osseous finding or fracture. No compression deformity or focal kyphosis. No soft tissue asymmetry in the neck or hematoma. Lung apices remain clear. IMPRESSION: No acute intracranial finding. Mild brain atrophy and chronic white matter microvascular ischemic changes No acute cervical spine fracture or malalignment. Cervical spondylosis and degenerative changes. Electronically Signed   By: Jerilynn Mages.  Shick M.D.   On: 06/27/2015 19:19   Ct Cervical Spine Wo Contrast  06/27/2015  CLINICAL DATA:  Headache, dizziness, neck pain, unsteady gait for 1 week. EXAM: CT HEAD WITHOUT CONTRAST CT CERVICAL SPINE WITHOUT CONTRAST  TECHNIQUE: Multidetector CT imaging of the head and cervical spine was performed following the standard protocol without intravenous contrast. Multiplanar CT image reconstructions of the cervical spine were also generated. COMPARISON:  None. FINDINGS: CT HEAD FINDINGS Age related brain atrophy and minor periventricular white matter microvascular ischemic changes. No acute intracranial hemorrhage, infarction, mass lesion, focal mass effect or edema. No hydrocephalus, midline shift, herniation, or extra-axial fluid collection. Normal gray-white matter differentiation. Cisterns are patent. Cerebellar atrophy as well. Orbits are symmetric. Mastoids and sinuses remain clear. Skull intact. CT CERVICAL SPINE FINDINGS Bones are osteopenic. Diffuse cervical degenerative disc disease and spondylosis at all levels most pronounced from C3-4 to C7-T1. Normal prevertebral soft tissues. Intact odontoid. Facets are aligned. No acute osseous finding or fracture. No compression deformity or focal kyphosis. No soft tissue asymmetry in the neck or hematoma. Lung apices remain clear. IMPRESSION: No acute intracranial finding. Mild brain atrophy and  chronic white matter microvascular ischemic changes No acute cervical spine fracture or malalignment. Cervical spondylosis and degenerative changes. Electronically Signed   By: Jerilynn Mages.  Shick M.D.   On: 06/27/2015 19:19   Mr Brain Wo Contrast (neuro Protocol)  06/27/2015  CLINICAL DATA:  Initial valuation for headaches with dizziness. EXAM: MRI HEAD WITHOUT CONTRAST TECHNIQUE: Multiplanar, multiecho pulse sequences of the brain and surrounding structures were obtained without intravenous contrast. COMPARISON:  Prior head CT from earlier the same day. FINDINGS: Mild generalized age-related cerebral atrophy is present. Patchy T2/FLAIR hyperintensity within the periventricular and deep white matter both cerebral hemispheres noted, nonspecific, but most likely related to mild chronic small vessel ischemic disease. Similar changes are seen within the pons. No abnormal foci of restricted diffusion to suggest acute intracranial infarct. Gray-white matter differentiation maintained. Normal intravascular flow voids are preserved. No acute or chronic intracranial hemorrhage. No mass lesion, midline shift, or mass effect. No hydrocephalus. No extra-axial fluid collection. Craniocervical junction within normal limits. Incidental note made of a partially empty sella. No acute abnormality about the orbits. Paranasal sinuses are clear. No mastoid effusion. Inner ear structures normal. Bone marrow signal intensity within normal limits. Scalp soft tissues unremarkable. IMPRESSION: 1. No acute intracranial process. 2. Age-related cerebral volume loss with mild chronic small vessel ischemic disease. Electronically Signed   By: Jeannine Boga M.D.   On: 06/27/2015 21:58    EKG: Independently reviewed. Sinus tachycardia, ST-T wave changes in the lateral leads   Assessment/Plan Active Problems:   DM type 2 with diabetic peripheral neuropathy (HCC)   Iron deficiency anemia due to chronic blood loss   Essential hypertension,  benign   Peripheral vertigo   Dizziness  Dizziness Unclear etiology, MRI brain is negative for stroke. Differentials include BPPV, B-12 deficiency, medication side effect of Viberzi, as this medication can cause dizziness.  Hold Viberzi Check B-12 level Continue meclizine 25 mg 3 times a day Gentle IV fluids, hold Lasix If no improvement in next 10-12 hours, consider vestibular PT  Hypertension Continue metoprolol Start hydralazine 25 mg by mouth every 6 hours when necessary for BP greater than 160/100  Iron deficiency anemia Patient gets IV iron as outpatient Hemoglobin is stable  History of COPD Continue duo nebs when necessary, not in exacerbation at this time  Diabetes mellitus Continue sliding-scale insulin, Lantus 26 units at bedtime  DVT prophylaxis Lovenox  Code status: Full code  Family discussion: No family at bedside   Time Spent on Admission: 60 min  [facility] Pager: [phone] 06/27/2015, 11:07 PM  If 7PM-7AM,  please contact night-coverage  [email or web address]  Password [email or web address]

## 2015-06-28 DIAGNOSIS — R42 Dizziness and giddiness: Secondary | ICD-10-CM | POA: Diagnosis not present

## 2015-06-28 DIAGNOSIS — J449 Chronic obstructive pulmonary disease, unspecified: Secondary | ICD-10-CM | POA: Diagnosis present

## 2015-06-28 DIAGNOSIS — H8110 Benign paroxysmal vertigo, unspecified ear: Secondary | ICD-10-CM

## 2015-06-28 DIAGNOSIS — I10 Essential (primary) hypertension: Secondary | ICD-10-CM | POA: Diagnosis not present

## 2015-06-28 LAB — COMPREHENSIVE METABOLIC PANEL
ALK PHOS: 79 U/L (ref 38–126)
ALT: 19 U/L (ref 14–54)
ANION GAP: 6 (ref 5–15)
AST: 22 U/L (ref 15–41)
Albumin: 3.1 g/dL — ABNORMAL LOW (ref 3.5–5.0)
BILIRUBIN TOTAL: 0.8 mg/dL (ref 0.3–1.2)
BUN: 21 mg/dL — ABNORMAL HIGH (ref 6–20)
CALCIUM: 7.8 mg/dL — AB (ref 8.9–10.3)
CO2: 29 mmol/L (ref 22–32)
Chloride: 105 mmol/L (ref 101–111)
Creatinine, Ser: 1.13 mg/dL — ABNORMAL HIGH (ref 0.44–1.00)
GFR calc Af Amer: 56 mL/min — ABNORMAL LOW (ref >60)
GFR, EST NON AFRICAN AMERICAN: 48 mL/min — AB (ref >60)
GLUCOSE: 125 mg/dL — AB (ref 65–99)
POTASSIUM: 4.3 mmol/L (ref 3.5–5.1)
Sodium: 140 mmol/L (ref 135–145)
TOTAL PROTEIN: 6.2 g/dL — AB (ref 6.5–8.1)

## 2015-06-28 LAB — GLUCOSE, CAPILLARY
GLUCOSE-CAPILLARY: 118 mg/dL — AB (ref 65–99)
GLUCOSE-CAPILLARY: 130 mg/dL — AB (ref 65–99)
Glucose-Capillary: 145 mg/dL — ABNORMAL HIGH (ref 65–99)

## 2015-06-28 LAB — CBC
HEMATOCRIT: 32.5 % — AB (ref 36.0–46.0)
HEMOGLOBIN: 10.6 g/dL — AB (ref 12.0–15.0)
MCH: 28.9 pg (ref 26.0–34.0)
MCHC: 32.6 g/dL (ref 30.0–36.0)
MCV: 88.6 fL (ref 78.0–100.0)
Platelets: 239 10*3/uL (ref 150–400)
RBC: 3.67 MIL/uL — ABNORMAL LOW (ref 3.87–5.11)
RDW: 13.1 % (ref 11.5–15.5)
WBC: 9.9 10*3/uL (ref 4.0–10.5)

## 2015-06-28 LAB — TROPONIN I: Troponin I: <0.03 ng/mL (ref <0.031)

## 2015-06-28 LAB — VITAMIN B12: Vitamin B-12: 484 pg/mL (ref 180–914)

## 2015-06-28 MED ORDER — HEPARIN SOD (PORK) LOCK FLUSH 100 UNIT/ML IV SOLN
500.0000 [IU] | INTRAVENOUS | Status: DC
  Administered 2015-06-28: 500 [IU]
  Filled 2015-06-28: qty 5

## 2015-06-28 MED ORDER — MECLIZINE HCL 25 MG PO TABS: 25.0000 mg | ORAL_TABLET | Freq: Three times a day (TID) | ORAL | Status: DC | PRN

## 2015-06-28 MED ORDER — HEPARIN SOD (PORK) LOCK FLUSH 100 UNIT/ML IV SOLN
500.0000 [IU] | INTRAVENOUS | Status: DC | PRN
Start: 2015-06-28 — End: 2015-06-28

## 2015-06-28 MED ORDER — BUDESONIDE-FORMOTEROL FUMARATE 160-4.5 MCG/ACT IN AERO
INHALATION_SPRAY | RESPIRATORY_TRACT | Status: AC
  Filled 2015-06-28: qty 6

## 2015-06-28 NOTE — Discharge Summary (Addendum)
Physician Discharge Summary  [name] [id number] DOB: [birth year] DOA: 06/27/2015  PCP: [name], MD  Admit date: 06/27/2015 Discharge date: 06/28/2015  Time spent: 25 minutes  Recommendations for Outpatient Follow-up:  1. Discharge home with outpatient PCP follow-up in 1 week   Discharge Diagnoses:  Principal Problem:   Dizziness   Active Problems:   DM type 2 with diabetic peripheral neuropathy (HCC)   Iron deficiency anemia due to chronic blood loss   Essential hypertension, benign   ? Benign positional vertigo   COPD (chronic obstructive pulmonary disease) (HCC)   Discharge Condition: Fair  Diet recommendation: Diabetic/heart healthy  Filed Weights   06/27/15 1812 06/28/15 0000  Weight: 102.059 kg (225 lb) 104.463 kg (230 lb 4.8 oz)    History of present illness:  Please refer to admission H&P for details, in brief, 69 year old morbidly obese female with history of COPD with chronic respiratory failure on home O2 (2 L), type 2 diabetes mellitus, GERD, IBS, iron deficiency anemia, B12 deficiency, started sleep apnea, chronic low back pain with lumbar radiculopathy, hyperlipidemia and hypertension resented with dizziness and headache for the past 2 days. Patient reports that when she walks around she becomes unsteady. She was recently started on Viberzi ( Eluxadoline) for IBS. She denies any blurred vision, syncope, chest pain, palpitations or shortness of breath. Denies any weakness of her limbs. Denies any slurred speech. Head CT and CT of the cervical spine done in the ED were unremarkable. MRI of the brain was done which was negative for stroke as well. Since symptoms were persistent she was admitted to hospitalist service.   Hospital Course:  Dizziness with vertigo Differential includes benign positional vertigo versus medication side effect of Viberzi ( reported to cause dizziness in upto 3% of patients). Recent placed on scheduled meclizine and  viberzi continued. Her Lasix was held and given IV fluids. -CT head and MRI negative. Orthostasis negative as well. - Symptoms have resolved this morning. -Seen by physical therapy and recommends no further follow-up. -I will discharge her on meclizine 25 mg 3 times a day as needed for her symptoms. Will discontinue Viberzi and follow-up with her PCP. -Has B12 deficiency and receives monthly B12 injections.  Essential hypertension Continue metoprolol. Resume Lasix.  Iron deficiency anemia Received IV iron as outpatient. Hemoglobin stable.  COPD with chronic respiratory failure. Stable. On home O2. Continue all medications.  Type 2 diabetes mellitus Resume home dose Lantus.  Patient is stable to be discharged home with outpatient follow-up.   CODE STATUS: Full code Family communication: Husband at bedside Disposition: Home with PCP follow-up.  Procedures: CT head and cervical spine MRI brain  Consultations: None  Discharge Exam: Filed Vitals:   06/28/15 0000 06/28/15 0615  BP: 201/68 144/51  Pulse: 98 102  Temp: 98.3 F (36.8 C) 98 F (36.7 C)  Resp: 22 20    General: Elderly morbidly obese female not in distress HEENT: No pallor, moist oral mucosa, supple neck, no nystagmus Chest: Clear to auscultation bilaterally CVS: Normal S1 and S2, no murmurs GI: Soft, nondistended, nontender, bowel sounds present Musculoskeletal: Warm, no edema CNS: Alert and oriented, nonfocal   Discharge Instructions    Current Discharge Medication List    START taking these medications   Details  meclizine (ANTIVERT) 25 MG tablet Take 1 tablet (25 mg total) by mouth 3 (three) times daily as needed for dizziness. Qty: 30 tablet, Refills: 0      CONTINUE these medications which have NOT  CHANGED   Details  allopurinol (ZYLOPRIM) 100 MG tablet Take 1 tablet by mouth 2 (two) times daily.    amitriptyline (ELAVIL) 100 MG tablet Take 100 mg by mouth at bedtime.      aspirin EC 81  MG tablet Take 81 mg by mouth daily.    aspirin-acetaminophen-caffeine (EXCEDRIN EXTRA STRENGTH) [id number] MG per tablet Take by mouth every 6 (six) hours as needed for headache.    atorvastatin (LIPITOR) 40 MG tablet Take 1 tablet by mouth at bedtime.     BIOTIN PO Take 1 capsule by mouth daily.     budesonide-formoterol (SYMBICORT) 160-4.5 MCG/ACT inhaler Inhale 2 puffs into the lungs 2 (two) times daily. Qty: 3 Inhaler, Refills: 4    cyanocobalamin (,VITAMIN B-12,) 1000 MCG/ML injection Inject 1,000 mcg into the muscle every 30 (thirty) days.    esomeprazole (NEXIUM) 40 MG capsule TAKE 1 CAPSULE TWICE A DAY 30 MINUTES PRIOR TO MEALS Qty: 180 capsule, Refills: 3    famotidine (PEPCID) 40 MG tablet TAKE 1 TABLET AT BEDTIME AS NEEDED TO CONTROL REFLUX Qty: 90 tablet, Refills: 1    furosemide (LASIX) 40 MG tablet Take 40 mg by mouth daily.     HUMALOG KWIKPEN 100 UNIT/ML SOPN Inject 14-18 Units into the skin 3 (three) times daily with meals. Based on SLIDING SCALE    HYDROcodone-acetaminophen (NORCO/VICODIN) 5-325 MG per tablet Take 1 tablet by mouth every 8 (eight) hours as needed for moderate pain.     ipratropium-albuterol (DUONEB) 0.5-2.5 (3) MG/3ML SOLN 1 neb every 4-6 hours as needed for wheezing/shortness of breath Qty: 360 mL, Refills: 5    LANTUS SOLOSTAR 100 UNIT/ML SOPN Inject 26 Units into the skin at bedtime.     metFORMIN (GLUCOPHAGE) 1000 MG tablet Take 1,000 mg by mouth 2 (two) times daily with a meal.    metoprolol succinate (TOPROL-XL) 25 MG 24 hr tablet Take 50 mg by mouth daily.     ondansetron (ZOFRAN) 4 MG tablet Take 1 tablet (4 mg total) by mouth every 8 (eight) hours as needed for nausea or vomiting. Qty: 45 tablet, Refills: 2      STOP taking these medications     Eluxadoline (VIBERZI) 75 MG TABS        No Known Allergies Follow-up Information    Follow up with TAPPER,DAVID B, MD. Schedule an appointment as soon as possible for a visit in 1  week.   Specialty:  Family Medicine   Contact information:   [place] [address] [phone]        The results of significant diagnostics from this hospitalization (including imaging, microbiology, ancillary and laboratory) are listed below for reference.    Significant Diagnostic Studies: Ct Head Wo Contrast  06/27/2015  CLINICAL DATA:  Headache, dizziness, neck pain, unsteady gait for 1 week. EXAM: CT HEAD WITHOUT CONTRAST CT CERVICAL SPINE WITHOUT CONTRAST TECHNIQUE: Multidetector CT imaging of the head and cervical spine was performed following the standard protocol without intravenous contrast. Multiplanar CT image reconstructions of the cervical spine were also generated. COMPARISON:  None. FINDINGS: CT HEAD FINDINGS Age related brain atrophy and minor periventricular white matter microvascular ischemic changes. No acute intracranial hemorrhage, infarction, mass lesion, focal mass effect or edema. No hydrocephalus, midline shift, herniation, or extra-axial fluid collection. Normal gray-white matter differentiation. Cisterns are patent. Cerebellar atrophy as well. Orbits are symmetric. Mastoids and sinuses remain clear. Skull intact. CT CERVICAL SPINE FINDINGS Bones are osteopenic. Diffuse cervical degenerative  disc disease and spondylosis at all levels most pronounced from C3-4 to C7-T1. Normal prevertebral soft tissues. Intact odontoid. Facets are aligned. No acute osseous finding or fracture. No compression deformity or focal kyphosis. No soft tissue asymmetry in the neck or hematoma. Lung apices remain clear. IMPRESSION: No acute intracranial finding. Mild brain atrophy and chronic white matter microvascular ischemic changes No acute cervical spine fracture or malalignment. Cervical spondylosis and degenerative changes. Electronically Signed   By: Jerilynn Mages.  Shick M.D.   On: 06/27/2015 19:19   Ct Cervical Spine Wo Contrast  06/27/2015  CLINICAL DATA:  Headache, dizziness,  neck pain, unsteady gait for 1 week. EXAM: CT HEAD WITHOUT CONTRAST CT CERVICAL SPINE WITHOUT CONTRAST TECHNIQUE: Multidetector CT imaging of the head and cervical spine was performed following the standard protocol without intravenous contrast. Multiplanar CT image reconstructions of the cervical spine were also generated. COMPARISON:  None. FINDINGS: CT HEAD FINDINGS Age related brain atrophy and minor periventricular white matter microvascular ischemic changes. No acute intracranial hemorrhage, infarction, mass lesion, focal mass effect or edema. No hydrocephalus, midline shift, herniation, or extra-axial fluid collection. Normal gray-white matter differentiation. Cisterns are patent. Cerebellar atrophy as well. Orbits are symmetric. Mastoids and sinuses remain clear. Skull intact. CT CERVICAL SPINE FINDINGS Bones are osteopenic. Diffuse cervical degenerative disc disease and spondylosis at all levels most pronounced from C3-4 to C7-T1. Normal prevertebral soft tissues. Intact odontoid. Facets are aligned. No acute osseous finding or fracture. No compression deformity or focal kyphosis. No soft tissue asymmetry in the neck or hematoma. Lung apices remain clear. IMPRESSION: No acute intracranial finding. Mild brain atrophy and chronic white matter microvascular ischemic changes No acute cervical spine fracture or malalignment. Cervical spondylosis and degenerative changes. Electronically Signed   By: Jerilynn Mages.  Shick M.D.   On: 06/27/2015 19:19   Mr Brain Wo Contrast (neuro Protocol)  06/27/2015  CLINICAL DATA:  Initial valuation for headaches with dizziness. EXAM: MRI HEAD WITHOUT CONTRAST TECHNIQUE: Multiplanar, multiecho pulse sequences of the brain and surrounding structures were obtained without intravenous contrast. COMPARISON:  Prior head CT from earlier the same day. FINDINGS: Mild generalized age-related cerebral atrophy is present. Patchy T2/FLAIR hyperintensity within the periventricular and deep white  matter both cerebral hemispheres noted, nonspecific, but most likely related to mild chronic small vessel ischemic disease. Similar changes are seen within the pons. No abnormal foci of restricted diffusion to suggest acute intracranial infarct. Gray-white matter differentiation maintained. Normal intravascular flow voids are preserved. No acute or chronic intracranial hemorrhage. No mass lesion, midline shift, or mass effect. No hydrocephalus. No extra-axial fluid collection. Craniocervical junction within normal limits. Incidental note made of a partially empty sella. No acute abnormality about the orbits. Paranasal sinuses are clear. No mastoid effusion. Inner ear structures normal. Bone marrow signal intensity within normal limits. Scalp soft tissues unremarkable. IMPRESSION: 1. No acute intracranial process. 2. Age-related cerebral volume loss with mild chronic small vessel ischemic disease. Electronically Signed   By: Jeannine Boga M.D.   On: 06/27/2015 21:58    Microbiology: No results found for this or any previous visit (from the past 240 hour(s)).   Labs: Basic Metabolic Panel:  Recent Labs Lab 06/27/15 1958 06/28/15 0543  NA 139 140  K 4.7 4.3  CL 102 105  CO2 26 29  GLUCOSE 149* 125*  BUN 24* 21*  CREATININE 1.22* 1.13*  CALCIUM 8.3* 7.8*   Liver Function Tests:  Recent Labs Lab 06/28/15 0543  AST 22  ALT 19  ALKPHOS  79  BILITOT 0.8  PROT 6.2*  ALBUMIN 3.1*   No results for input(s): LIPASE, AMYLASE in the last 168 hours. No results for input(s): AMMONIA in the last 168 hours. CBC:  Recent Labs Lab 06/27/15 1958 06/28/15 0543  WBC 11.8* 9.9  NEUTROABS 7.7  --   HGB 11.5* 10.6*  HCT 36.1 32.5*  MCV 88.9 88.6  PLT 217 239   Cardiac Enzymes:  Recent Labs Lab 06/28/15 0008 06/28/15 0544  TROPONINI <0.03 <0.03   BNP: BNP (last 3 results) No results for input(s): BNP in the last 8760 hours.  ProBNP (last 3 results) No results for input(s):  PROBNP in the last 8760 hours.  CBG:  Recent Labs Lab 06/28/15 0026 06/28/15 0745  GLUCAP 130* 118*       Signed:  Anastyn Ayars  [facility] 06/28/2015, 10:48 AM

## 2015-06-28 NOTE — Care Management Obs Status (Signed)
[facility] NOTIFICATION   Patient Details  Name: [name] MRN: [id number] Date of Birth: 07/02/1946   Medicare Observation Status Notification Given:  Yes    Joylene Draft, RN 06/28/2015, 9:23 AM

## 2015-06-28 NOTE — Progress Notes (Signed)
Discharged PT per MD order and protocol. Reviewed discharge teaching and handouts given. Pt verbalized understanding and left with all belongings. VSS. IV catheter D/C.  Patient wheeled down by staff member. Kierston Plasencia J Everett, RN  

## 2015-06-28 NOTE — Care Management Note (Signed)
Case Management Note  Patient Details  Name: [name] MRN: [id number] Date of Birth: [birth year]  Subjective/Objective:                  Pt admitted from home with vertigo. Pt lives with her husband and will return home at discharge. Pt is independent with ADL's.  Action/Plan: Anticipate discharge today. No CM needs noted.  Expected Discharge Date:                  Expected Discharge Plan:  Home/Self Care  In-House Referral:  NA  Discharge planning Services  CM Consult  Post Acute Care Choice:  NA Choice offered to:  NA  DME Arranged:    DME Agency:     HH Arranged:    HH Agency:     Status of Service:  Completed, signed off  Medicare Important Message Given:    Date Medicare IM Given:    Medicare IM give by:    Date Additional Medicare IM Given:    Additional Medicare Important Message give by:     If discussed at Welcome of Stay Meetings, dates discussed:    Additional Comments:  Joylene Draft, RN 06/28/2015, 9:24 AM

## 2015-06-28 NOTE — Discharge Instructions (Signed)
Dizziness °Dizziness is a common problem. It makes you feel unsteady or lightheaded. You may feel like you are about to pass out (faint). Dizziness can lead to injury if you stumble or fall. Anyone can get dizzy, but dizziness is more common in older adults. This condition can be caused by a number of things, including: °· Medicines. °· Dehydration. °· Illness. °HOME CARE °Following these instructions may help with your condition: °Eating and Drinking °· Drink enough fluid to keep your pee (urine) clear or pale yellow. This helps to keep you from getting dehydrated. Try to drink more clear fluids, such as water. °· Do not drink alcohol. °· Limit how much caffeine you drink or eat if told by your doctor. °· Limit how much salt you drink or eat if told by your doctor. °Activity °· Avoid making quick movements. °¨ When you stand up from sitting in a chair, steady yourself until you feel okay. °¨ In the morning, first sit up on the side of the bed. When you feel okay, stand slowly while you hold onto something. Do this until you know that your balance is fine. °· Move your legs often if you need to stand in one place for a long time. Tighten and relax your muscles in your legs while you are standing. °· Do not drive or use heavy machinery if you feel dizzy. °· Avoid bending down if you feel dizzy. Place items in your home so that they are easy for you to reach without leaning over. °Lifestyle °· Do not use any tobacco products, including cigarettes, chewing tobacco, or electronic cigarettes. If you need help quitting, ask your doctor. °· Try to lower your stress level, such as with yoga or meditation. Talk with your doctor if you need help. °General Instructions °· Watch your dizziness for any changes. °· Take medicines only as told by your doctor. Talk with your doctor if you think that your dizziness is caused by a medicine that you are taking. °· Tell a friend or a family member that you are feeling dizzy. If he or  she notices any changes in your behavior, have this person call your doctor. °· Keep all follow-up visits as told by your doctor. This is important. °GET HELP IF: °· Your dizziness does not go away. °· Your dizziness or light-headedness gets worse. °· You feel sick to your stomach (nauseous). °· You have trouble hearing. °· You have new symptoms. °· You are unsteady on your feet or you feel like the room is spinning. °GET HELP RIGHT AWAY IF: °· You throw up (vomit) or have diarrhea and are unable to eat or drink anything. °· You have trouble: °¨ Talking. °¨ Walking. °¨ Swallowing. °¨ Using your arms, hands, or legs. °· You feel generally weak. °· You are not thinking clearly or you have trouble forming sentences. It may take a friend or family member to notice this. °· You have: °¨ Chest pain. °¨ Pain in your belly (abdomen). °¨ Shortness of breath. °¨ Sweating. °· Your vision changes. °· You are bleeding. °· You have a headache. °· You have neck pain or a stiff neck. °· You have a fever. °  °This information is not intended to replace advice given to you by your health care provider. Make sure you discuss any questions you have with your health care provider. °  °Document Released: 07/11/2011 Document Revised: 12/06/2014 Document Reviewed: 07/18/2014 °Elsevier Interactive Patient Education ©2016 [employer] ° °

## 2015-06-28 NOTE — Evaluation (Signed)
Physical Therapy Evaluation Patient Details Name: [name] MRN: [id number] DOB: 08-30-1945 Today's Date: 06/28/2015   History of Present Illness  Pt is a 69 year old female admitted for c/o dizziness.  She has multiple medical problems including COPD and morbid obesity.  She had just recently started taking a new drug for her IBS and one of the side effects of this drug is dizziness.  Pt lives with her husband and is normally independent at baseline.  Clinical Impression  Pt was seen for evaluation.  All of her initial sx have resolved in all planes of motion and with head in any position.  Her gait is rather labored due to chronic lumbar pain so I have recommended that she use her walker at home more of the time to help reduce the labor of gait.  She is well versed in the use of a walker.    Follow Up Recommendations No PT follow up    Equipment Recommendations  None recommended by PT    Recommendations for Other Services   none    Precautions / Restrictions Precautions Precautions: None Restrictions Weight Bearing Restrictions: No      Mobility  Bed Mobility Overal bed mobility: Modified Independent                Transfers Overall transfer level: Independent                  Ambulation/Gait Ambulation/Gait assistance: Modified independent (Device/Increase time) Ambulation Distance (Feet): 60 Feet Assistive device: None Gait Pattern/deviations: Trunk flexed;Wide base of support   Gait velocity interpretation: <1.8 ft/sec, indicative of risk for recurrent falls General Gait Details: rather lumbering gait which is her baseline...she reports that she ambulates like this due to chronic back problems.Marland KitchenMarland KitchenI have suggested that she use her walker at home to help to even out her gait rhythm  Stairs            Wheelchair Mobility    Modified Rankin (Stroke Patients Only)       Balance Overall balance assessment: No apparent balance deficits  (not formally assessed)                                           Pertinent Vitals/Pain Pain Assessment: No/denies pain    Home Living Family/patient expects to be discharged to:: Private residence Living Arrangements: Spouse/significant other Available Help at Discharge: Family;Available 24 hours/day Type of Home: House Home Access: Level entry     Home Layout: One level Home Equipment: Walker - 2 wheels;Shower seat;Cane - single point      Prior Function Level of Independence: Independent         Comments: uses a walker when back pain is flared up     Hand Dominance        Extremity/Trunk Assessment   Upper Extremity Assessment: Overall WFL for tasks assessed           Lower Extremity Assessment: Overall WFL for tasks assessed      Cervical / Trunk Assessment: Lordotic  Communication   Communication: No difficulties  Cognition Arousal/Alertness: Awake/alert Behavior During Therapy: WFL for tasks assessed/performed Overall Cognitive Status: Within Functional Limits for tasks assessed                      General Comments      Exercises  Assessment/Plan    PT Assessment Patent does not need any further PT services  PT Diagnosis     PT Problem List    PT Treatment Interventions     PT Goals (Current goals can be found in the Care Plan section) Acute Rehab PT Goals PT Goal Formulation: All assessment and education complete, DC therapy    Frequency     Barriers to discharge        Co-evaluation               End of Session Equipment Utilized During Treatment: Gait belt Activity Tolerance: Patient tolerated treatment well Patient left: in bed;with call bell/phone within reach      Functional Assessment Tool Used: clinical judgement Functional Limitation: Mobility: Walking and moving around Mobility: Walking and Moving Around Current Status [phone]): 0 percent impaired, limited or  restricted Mobility: Walking and Moving Around Goal Status [phone]): 0 percent impaired, limited or restricted Mobility: Walking and Moving Around Discharge Status [phone]): 0 percent impaired, limited or restricted    Time: 0942-1000 PT Time Calculation (min) (ACUTE ONLY): 18 min   Charges:   PT Evaluation $Initial PT Evaluation Tier I: 1 Procedure     PT G Codes:   PT G-Codes **NOT FOR INPATIENT CLASS** Functional Assessment Tool Used: clinical judgement Functional Limitation: Mobility: Walking and moving around Mobility: Walking and Moving Around Current Status [id number]): 0 percent impaired, limited or restricted Mobility: Walking and Moving Around Goal Status [id number]): 0 percent impaired, limited or restricted Mobility: Walking and Moving Around Discharge Status [id number]): 0 percent impaired, limited or restricted    Sable Feil  PT 06/28/2015, 10:23 AM [phone]

## 2015-06-29 LAB — HEMOGLOBIN A1C
HEMOGLOBIN A1C: 6.7 % — AB (ref 4.8–5.6)
Mean Plasma Glucose: 146 mg/dL

## 2015-11-22 ENCOUNTER — Encounter: Payer: Self-pay | Admitting: Gastroenterology

## 2015-12-18 ENCOUNTER — Other Ambulatory Visit: Payer: Self-pay | Admitting: Pulmonary Disease

## 2015-12-29 ENCOUNTER — Other Ambulatory Visit: Payer: Self-pay

## 2015-12-29 ENCOUNTER — Encounter: Payer: Self-pay | Admitting: Gastroenterology

## 2015-12-29 ENCOUNTER — Ambulatory Visit (INDEPENDENT_AMBULATORY_CARE_PROVIDER_SITE_OTHER): Payer: Self-pay | Admitting: Gastroenterology

## 2015-12-29 VITALS — BP 142/75 | HR 91 | Temp 97.6°F | Ht 59.0 in | Wt 226.2 lb

## 2015-12-29 DIAGNOSIS — R197 Diarrhea, unspecified: Secondary | ICD-10-CM

## 2015-12-29 DIAGNOSIS — Z800 Family history of malignant neoplasm of digestive organs: Secondary | ICD-10-CM

## 2015-12-29 MED ORDER — PEG-KCL-NACL-NASULF-NA ASC-C 100 G PO SOLR: 1.0000 | ORAL | Status: DC

## 2015-12-29 MED ORDER — PANCRELIPASE (LIP-PROT-AMYL) 40000-136000 UNITS PO CPEP: ORAL_CAPSULE | ORAL | Status: DC

## 2015-12-29 NOTE — Assessment & Plan Note (Addendum)
70 year old female with likely IBS-D, differentials included bile salt diarrhea, diabetic enteropathy, now with worsening symptoms and last lower GI evaluation in 2011. Now tells me her brother was diagnosed with colon cancer a few years ago. Would recommend colonoscopy now for high risk screening and for random colonic biopsies to exclude microscopic colitis. Notes a "greasy" film to stools but no significant weight loss. Not a candidate for Viberzi due to post-cholecystectomy state. Imodium without improvement. Trial pancreatic enzymes (zenpep 40,000 units, 2 with meals and 1 with snacks) , samples provided.   Proceed with colonoscopy with Dr. Oneida Alar in the near future. The risks, benefits, and alternatives have been discussed in detail with the patient. They state understanding and desire to proceed.  She is aware that new recommendations from the FDA include avoidance of Viberzi in post-cholecystectomy state.

## 2015-12-29 NOTE — Patient Instructions (Signed)
Start taking Zenpep 2 capsules with meals and 1 with snacks. I have provided samples.  We have scheduled you for a colonoscopy with Dr. Oneida Alar in the near future.  Have a good weekend!

## 2015-12-29 NOTE — Progress Notes (Addendum)
REVIEWED-NO ADDITIONAL RECOMMENDATIONS. Referring Provider: [name]., MD Primary Care Physician:  [name], MD  Primary GI: Dr. Oneida Alar   Chief Complaint  Patient presents with  . Follow-up    viberzi    HPI:   [name] is a 70 y.o. female presenting today with a history of diarrhea likely secondary to IBS-D, GERD. She is here to discuss agent instead of Viberzi, as it is now contraindicated in patients who are post-cholecystectomy, recently updated by the FDA in March 2017.    Not taking Viberzi. States diarrhea is "back to where it was" but worse than before. Sometimes 3-4 loose stools, sometimes more. Sometimes fecal incontinence, can hardly walk from kitchen to bathroom without an accident. No rectal bleeding that she has noticed. Notes abdominal cramping. Sometimes postprandial, sometimes "just happens". Stool at times looks greasy, like a "film" over it. She tells me her brother was diagnosed with colon cancer a few years ago, early 89s.   Past Medical History  Diagnosis Date  . COPD (chronic obstructive pulmonary disease) ([place])   . Degenerative disc disease   . Vitamin B12 deficiency   . Iron deficiency anemia   . Sleep apnea   . Essential hypertension, benign   . Mixed hyperlipidemia   . Type 2 diabetes mellitus ([place])   . History of hiatal hernia   . History of diverticulitis of colon   . GERD (gastroesophageal reflux disease)   . Gout   . Neuropathy ([place])   . Hiatal hernia 07/27/2013  . Irritable bowel syndrome   . Chronic back pain   . Lumbar radiculopathy     Past Surgical History  Procedure Laterality Date  . Esophagogastroduodenoscopy  11/19/2006    SLF: Large hiatal hernia without evidence of Cameron ulcers/. Distal esophageal stricture, which allowed the gastroscope to pass without resistance.  A 16 mm Savary later passed with mild resistance/ Normal stomach.sb bx negative  . Colonoscopy  10/01/2006    SLF:Pan colonic diverticulosis  and moderate internal hemorrhoids/ Otherwise no polyps, masses, inflammatory changes or AVMs/  . Cholecystectomy    . Partial hysterectomy  1978  . Tonsillectomy and adenoidectomy    . Umbilical hernia repair  2010  . Benign breast cysts    . Two back surgeries/fusion    . Abdominal hysterectomy    . Back surgery    . Esophagogastroduodenoscopy  10/01/2006    [id number] hiatal hernia.  Distal esophagus without evidence of   erythema, ulceration or Barrett's esophagus  . Esophagogastroduodenoscopy  2011    SLF: large hh, distal esophageal web narrowing to 72mm s/p dilation to 20mm  . Colonoscopy  2011    SLF: pancolonic diverticulosis, large internal hemorrhoids  . Small bowel capsule  2008    negative  . Esophagogastroduodenoscopy N/A 08/06/2013    SLF: 1. Stricture at the gastroesophageal junction 2. large hiatal hernia. 3. Mild erosive gastritis.  . Givens capsule study N/A 08/06/2013    INCOMPLETE-SMALL BOWLE ULCERS    Current Outpatient Prescriptions  Medication Sig Dispense Refill  . allopurinol (ZYLOPRIM) 100 MG tablet Take 1 tablet by mouth 2 (two) times daily.    Marland Kitchen amitriptyline (ELAVIL) 100 MG tablet Take 100 mg by mouth at bedtime.      Marland Kitchen aspirin EC 81 MG tablet Take 81 mg by mouth daily.    Marland Kitchen aspirin-acetaminophen-caffeine (EXCEDRIN EXTRA STRENGTH) [id number] MG per tablet Take by mouth every 6 (six) hours as needed for headache.    Marland Kitchen  atorvastatin (LIPITOR) 40 MG tablet Take 1 tablet by mouth at bedtime.     Marland Kitchen BIOTIN PO Take 1 capsule by mouth daily.     . budesonide-formoterol (SYMBICORT) 160-4.5 MCG/ACT inhaler Inhale 2 puffs into the lungs 2 (two) times daily. 3 Inhaler 4  . cyanocobalamin (,VITAMIN B-12,) 1000 MCG/ML injection Inject 1,000 mcg into the muscle every 30 (thirty) days.    Marland Kitchen esomeprazole (NEXIUM) 40 MG capsule TAKE 1 CAPSULE TWICE A DAY 30 MINUTES PRIOR TO MEALS 180 capsule 3  . famotidine (PEPCID) 40 MG tablet TAKE 1 TABLET AT BEDTIME AS NEEDED TO CONTROL  REFLUX (Patient taking differently: Take 40 mg by mouth at bedtime as needed for heartburn or indigestion. ) 90 tablet 1  . fluticasone furoate-vilanterol (BREO ELLIPTA) 100-25 MCG/INH AEPB Inhale 1 puff into the lungs daily.    . furosemide (LASIX) 40 MG tablet Take 40 mg by mouth daily.     Marland Kitchen HUMALOG KWIKPEN 100 UNIT/ML SOPN Inject 14-18 Units into the skin 3 (three) times daily with meals. Based on SLIDING SCALE    . LANTUS SOLOSTAR 100 UNIT/ML SOPN Inject 26 Units into the skin at bedtime.     . meclizine (ANTIVERT) 25 MG tablet Take 1 tablet (25 mg total) by mouth 3 (three) times daily as needed for dizziness. 30 tablet 0  . metFORMIN (GLUCOPHAGE) 1000 MG tablet Take 1,000 mg by mouth 2 (two) times daily with a meal.    . metoprolol succinate (TOPROL-XL) 25 MG 24 hr tablet Take 50 mg by mouth daily.     . ondansetron (ZOFRAN) 4 MG tablet Take 1 tablet (4 mg total) by mouth every 8 (eight) hours as needed for nausea or vomiting. 45 tablet 2  . HYDROcodone-acetaminophen (NORCO/VICODIN) 5-325 MG per tablet Take 1 tablet by mouth every 8 (eight) hours as needed for moderate pain. Reported on 12/29/2015    . ipratropium-albuterol (DUONEB) 0.5-2.5 (3) MG/3ML SOLN 1 neb every 4-6 hours as needed for wheezing/shortness of breath (Patient not taking: Reported on 12/29/2015) 360 mL 5   No current facility-administered medications for this visit.    Allergies as of 12/29/2015  . (No Known Allergies)    Family History  Problem Relation Age of Onset  . Colon cancer Brother   . Ulcers Sister   . Diabetes Sister   . Heart attack Sister   . Kidney failure Sister   . Stroke Sister   . Ulcers Mother   . Diabetes Mother   . Heart attack Mother   . Stroke Mother   . Asthma Mother   . Heart attack Brother   . Asthma Sister   . Diabetes Brother   . Stroke Maternal Grandmother   . Heart attack Maternal Grandmother   . Heart attack Other     Social History   Social History  . Marital Status:  Married    Spouse Name: N/A  . Number of Children: 4  . Years of Education: N/A   Social History Main Topics  . Smoking status: Former Smoker -- 1.00 packs/day for 1 years    Types: Cigarettes    Quit date: 08/05/1961  . Smokeless tobacco: Former Systems developer    Quit date: 06/30/1962     Comment: Quit smoking x 50 years  . Alcohol Use: No  . Drug Use: No  . Sexual Activity: Not Asked   Other Topics Concern  . None   Social History Narrative   HAS 4 SON-GRAND KIDS AND GREAT  GRAND KIDS.    Review of Systems: Gen: Denies fever, chills, anorexia. Denies fatigue, weakness, weight loss.  CV: Denies chest pain, palpitations, syncope, peripheral edema, and claudication. Resp: +DOE  GI: see HPI  Derm: Denies rash, itching, dry skin Psych: Denies depression, anxiety, memory loss, confusion. No homicidal or suicidal ideation.  Heme: Denies bruising, bleeding, and enlarged lymph nodes.  Physical Exam: BP 142/75 mmHg  Pulse 91  Temp(Src) 97.6 F (36.4 C) (Oral)  Ht 4\' 11"  (1.499 m)  Wt 226 lb 3.2 oz (102.604 kg)  BMI 45.66 kg/m2 General:   Alert and oriented. No distress noted. Pleasant and cooperative.  Head:  Normocephalic and atraumatic. Eyes:  Conjuctiva clear without scleral icterus. Mouth:  Oral mucosa pink and moist. Good dentition. No lesions. Heart:  S1, S2 present without murmurs, rubs, or gallops. Regular rate and rhythm. Abdomen:  +BS, soft, round and obese. Umbilical hernia noted.  Msk:  Symmetrical without gross deformities. Normal posture. Extremities:  2-3+ lower extremity edema  Neurologic:  Alert and  oriented x4;  grossly normal neurologically. Psych:  Alert and cooperative. Normal mood and affect.

## 2016-01-02 NOTE — Progress Notes (Signed)
CC'D TO PCP °

## 2016-01-26 ENCOUNTER — Encounter (HOSPITAL_COMMUNITY): Payer: Self-pay | Admitting: *Deleted

## 2016-01-26 ENCOUNTER — Encounter (HOSPITAL_COMMUNITY)
Admission: RE | Disposition: A | Discharge status: Home / Self Care | Payer: Self-pay | Source: Ambulatory Visit | Attending: Gastroenterology

## 2016-01-26 ENCOUNTER — Ambulatory Visit (HOSPITAL_COMMUNITY)
Admission: RE | Admit: 2016-01-26 | Discharge: 2016-01-26 | Disposition: A | Discharge status: Home / Self Care | Payer: Managed Care, Other (non HMO) | Source: Ambulatory Visit | Attending: Gastroenterology | Admitting: Gastroenterology

## 2016-01-26 ENCOUNTER — Other Ambulatory Visit: Payer: Self-pay | Admitting: Gastroenterology

## 2016-01-26 DIAGNOSIS — Z800 Family history of malignant neoplasm of digestive organs: Secondary | ICD-10-CM | POA: Diagnosis not present

## 2016-01-26 DIAGNOSIS — Z794 Long term (current) use of insulin: Secondary | ICD-10-CM | POA: Insufficient documentation

## 2016-01-26 DIAGNOSIS — E782 Mixed hyperlipidemia: Secondary | ICD-10-CM | POA: Insufficient documentation

## 2016-01-26 DIAGNOSIS — Z79899 Other long term (current) drug therapy: Secondary | ICD-10-CM | POA: Insufficient documentation

## 2016-01-26 DIAGNOSIS — G4730 Sleep apnea, unspecified: Secondary | ICD-10-CM | POA: Diagnosis not present

## 2016-01-26 DIAGNOSIS — I10 Essential (primary) hypertension: Secondary | ICD-10-CM | POA: Diagnosis not present

## 2016-01-26 DIAGNOSIS — E1140 Type 2 diabetes mellitus with diabetic neuropathy, unspecified: Secondary | ICD-10-CM | POA: Diagnosis not present

## 2016-01-26 DIAGNOSIS — Z7982 Long term (current) use of aspirin: Secondary | ICD-10-CM | POA: Insufficient documentation

## 2016-01-26 DIAGNOSIS — K219 Gastro-esophageal reflux disease without esophagitis: Secondary | ICD-10-CM | POA: Insufficient documentation

## 2016-01-26 DIAGNOSIS — Q438 Other specified congenital malformations of intestine: Secondary | ICD-10-CM | POA: Insufficient documentation

## 2016-01-26 DIAGNOSIS — J449 Chronic obstructive pulmonary disease, unspecified: Secondary | ICD-10-CM | POA: Diagnosis not present

## 2016-01-26 DIAGNOSIS — K644 Residual hemorrhoidal skin tags: Secondary | ICD-10-CM | POA: Insufficient documentation

## 2016-01-26 DIAGNOSIS — R197 Diarrhea, unspecified: Secondary | ICD-10-CM

## 2016-01-26 DIAGNOSIS — D123 Benign neoplasm of transverse colon: Secondary | ICD-10-CM | POA: Diagnosis not present

## 2016-01-26 DIAGNOSIS — K529 Noninfective gastroenteritis and colitis, unspecified: Secondary | ICD-10-CM | POA: Insufficient documentation

## 2016-01-26 DIAGNOSIS — D122 Benign neoplasm of ascending colon: Secondary | ICD-10-CM | POA: Diagnosis not present

## 2016-01-26 DIAGNOSIS — G8929 Other chronic pain: Secondary | ICD-10-CM | POA: Insufficient documentation

## 2016-01-26 DIAGNOSIS — K648 Other hemorrhoids: Secondary | ICD-10-CM | POA: Diagnosis not present

## 2016-01-26 DIAGNOSIS — D124 Benign neoplasm of descending colon: Secondary | ICD-10-CM | POA: Diagnosis not present

## 2016-01-26 DIAGNOSIS — K5730 Diverticulosis of large intestine without perforation or abscess without bleeding: Secondary | ICD-10-CM | POA: Insufficient documentation

## 2016-01-26 HISTORY — PX: COLONOSCOPY: SHX5424

## 2016-01-26 LAB — GLUCOSE, CAPILLARY: GLUCOSE-CAPILLARY: 198 mg/dL — AB (ref 65–99)

## 2016-01-26 SURGERY — COLONOSCOPY
Anesthesia: Moderate Sedation

## 2016-01-26 MED ORDER — LINACLOTIDE 72 MCG PO CAPS: 72.0000 ug | ORAL_CAPSULE | Freq: Every day | ORAL | Status: DC

## 2016-01-26 MED ORDER — MEPERIDINE HCL 100 MG/ML IJ SOLN
INTRAMUSCULAR | Status: DC | PRN
  Administered 2016-01-26 (×2): 50 mg

## 2016-01-26 MED ORDER — SODIUM CHLORIDE 0.9 % IV SOLN
INTRAVENOUS | Status: DC
  Administered 2016-01-26: 1000 mL via INTRAVENOUS

## 2016-01-26 MED ORDER — MEPERIDINE HCL 100 MG/ML IJ SOLN
INTRAMUSCULAR | Status: AC
  Filled 2016-01-26: qty 2

## 2016-01-26 MED ORDER — MIDAZOLAM HCL 5 MG/5ML IJ SOLN
INTRAMUSCULAR | Status: DC | PRN
  Administered 2016-01-26 (×2): 2 mg via INTRAVENOUS
  Administered 2016-01-26: 1 mg via INTRAVENOUS

## 2016-01-26 MED ORDER — MIDAZOLAM HCL 5 MG/5ML IJ SOLN
INTRAMUSCULAR | Status: AC
  Filled 2016-01-26: qty 10

## 2016-01-26 NOTE — Op Note (Signed)
[facility] Patient Name: [name] Procedure Date: 01/26/2016 8:40 AM MRN: [id number] Date of Birth: 1946/05/20 Attending MD: Barney Drain , MD CSN: [id number] Age: 70 Admit Type: Outpatient Procedure:                Colonoscopy WITH COLD FORCEPS/HOT SNARE POLYPECTOMY                            AND COLD FORCEPS BIOPSY Indications:              Chronic diarrhea-LAST TCS 2008-NO POLYPS/PANCOLONOC                            DIVERTICULOSIS Providers:                Barney Drain, MD, Janeece Riggers, RN, Georgeann Oppenheim,                            Technician Referring MD:             Zella Richer. Scotty Court, MD Medicines:                Meperidine 100 mg IV, Midazolam 5 mg IV Complications:            No immediate complications. Estimated Blood Loss:     Estimated blood loss was minimal. Procedure:                Pre-Anesthesia Assessment:                           - Prior to the procedure, a History and Physical                            was performed, and patient medications and                            allergies were reviewed. The patient's tolerance of                            previous anesthesia was also reviewed. The risks                            and benefits of the procedure and the sedation                            options and risks were discussed with the patient.                            All questions were answered, and informed consent                            was obtained. Prior Anticoagulants: The patient has                            taken aspirin, last dose was day of procedure. ASA  Grade Assessment: II - A patient with mild systemic                            disease. After reviewing the risks and benefits,                            the patient was deemed in satisfactory condition to                            undergo the procedure. After obtaining informed                            consent, the colonoscope was passed under direct                             vision. Throughout the procedure, the patient's                            blood pressure, pulse, and oxygen saturations were                            monitored continuously. The [id number]                            scope was introduced through the anus and advanced                            to the 8 cm into the ileum. The terminal ileum,                            ileocecal valve, appendiceal orifice, and rectum                            were photographed. The colonoscopy was somewhat                            difficult due to a tortuous colon. The quality of                            the bowel preparation was excellent. Scope In: 8:58:20 AM Scope Out: 9:20:44 AM Scope Withdrawal Time: 0 hours 17 minutes 36 seconds  Total Procedure Duration: 0 hours 22 minutes 24 seconds  Findings:      The digital rectal exam findings include non-thrombosed external       hemorrhoids.      Two sessile polyps were found in the descending colon(1- 8 mm) and       hepatic flexure(1-6 mm-base caauterized). The polyps were 5 to 8 mm in       size. These polyps were removed with a hot snare. Resection and       retrieval were complete.      A 8 mm polyp was found in the hepatic flexure. The polyp was sessile.       The polyp was removed with a cold biopsy forceps. Polyp resection was  incomplete, and the resected tissue was partially retrieved.      Multiple small and large-mouthed diverticula were found in the entire       colon.      The recto-sigmoid colon and sigmoid colon were moderately redundant.       Advancing the scope required straightening and shortening the scope to       obtain bowel loop reduction and COLOWRAP. This was biopsied with a cold       forceps for evaluation of microscopic colitis.      Non-bleeding external and internal hemorrhoids were found. The       hemorrhoids were moderate. Impression:               - LOOSE/WATERY STOOLS MOST  LIKELY DUE TO IB-C WITH                            OVERFLOW INCONTINENCE                           - PANCOLONIC Diverticulosis .                           - THREE COLORECTAL POLYPS REMOVED                           - MODERATELY Redundant colon.                           - Non-bleeding external and internal hemorrhoids. Moderate Sedation:      Moderate (conscious) sedation was administered by the endoscopy nurse       and supervised by the endoscopist. The following parameters were       monitored: oxygen saturation, heart rate, blood pressure, and response       to care. Total physician intraservice time was 36 minutes. Recommendation:           - Resume previous diet.                           - Await pathology results.                           - Repeat colonoscopy in 3 years for surveillance.                           - Return to my office in 4 months.                           DRINK WATER TO KEEP URINE LIGHT YELLOW.                           ADD LINZESS 30 MINS PRIOR TO BREAKFAST. IT MAY                            CAUSE EXPLOSIVE DIARRHEA. CALL WITH QUESTIONS OR                            CONCERNS.                           -  Patient has a contact number available for                            emergencies. The signs and symptoms of potential                            delayed complications were discussed with the                            patient. Return to normal activities tomorrow.                            Written discharge instructions were provided to the                            patient.                           - Continue present medications. Procedure Code(s):        --- Professional ---                           [phone], Colonoscopy, flexible; with removal of                            tumor(s), polyp(s), or other lesion(s) by snare                            technique                           45380, 59, Colonoscopy, flexible; with biopsy,                             single or multiple                           99153, Moderate sedation services; each additional                            15 minutes intraservice time                           G0500, Moderate sedation services provided by the                            same physician or other qualified health care                            professional performing a gastrointestinal                            endoscopic service that sedation supports,                            requiring the presence of an independent trained  observer to assist in the monitoring of the                            patient's level of consciousness and physiological                            status; initial 15 minutes of intra-service time;                            patient age 80 years or older (additional time may                            be reported with [phone], as appropriate) Diagnosis Code(s):        --- Professional ---                           K64.4, Residual hemorrhoidal skin tags                           K64.8, Other hemorrhoids                           D12.4, Benign neoplasm of descending colon                           D12.3, Benign neoplasm of transverse colon (hepatic                            flexure or splenic flexure)                           K52.9, Noninfective gastroenteritis and colitis,                            unspecified                           K57.30, Diverticulosis of large intestine without                            perforation or abscess without bleeding                           Q43.8, Other specified congenital malformations of                            intestine CPT copyright 2016 American Medical Association. All rights reserved. The codes documented in this report are preliminary and upon coder review may  be revised to meet current compliance requirements. Barney Drain, MD Barney Drain, MD 01/26/2016 12:53:29 PM This report has been signed  electronically. Number of Addenda: 0

## 2016-01-26 NOTE — OR Nursing (Signed)
Colon wrap used per Dr. Oneida Alar order. Patient without injury. Rep Haywood Pao in procedure.

## 2016-01-26 NOTE — H&P (Addendum)
Primary Care Physician:  [name], MD Primary Gastroenterologist:  Dr. Oneida Alar  Pre-Procedure History & Physical: HPI:  [name] is a 70 y.o. female here for  DIARRHEA. Nausea with prep(moderate). Has mild periumbilical pain.  Past Medical History  Diagnosis Date  . COPD (chronic obstructive pulmonary disease) ([place])   . Degenerative disc disease   . Vitamin B12 deficiency   . Iron deficiency anemia   . Sleep apnea   . Essential hypertension, benign   . Mixed hyperlipidemia   . Type 2 diabetes mellitus ([place])   . History of hiatal hernia   . History of diverticulitis of colon   . GERD (gastroesophageal reflux disease)   . Gout   . Neuropathy ([place])   . Hiatal hernia 07/27/2013  . Irritable bowel syndrome   . Chronic back pain   . Lumbar radiculopathy     Past Surgical History  Procedure Laterality Date  . Esophagogastroduodenoscopy  11/19/2006    SLF: Large hiatal hernia without evidence of Cameron ulcers/. Distal esophageal stricture, which allowed the gastroscope to pass without resistance.  A 16 mm Savary later passed with mild resistance/ Normal stomach.sb bx negative  . Colonoscopy  10/01/2006    SLF:Pan colonic diverticulosis and moderate internal hemorrhoids/ Otherwise no polyps, masses, inflammatory changes or AVMs/  . Cholecystectomy    . Partial hysterectomy  1978  . Tonsillectomy and adenoidectomy    . Umbilical hernia repair  2010  . Benign breast cysts    . Two back surgeries/fusion    . Abdominal hysterectomy    . Back surgery    . Esophagogastroduodenoscopy  10/01/2006    [id number] hiatal hernia.  Distal esophagus without evidence of   erythema, ulceration or Barrett's esophagus  . Esophagogastroduodenoscopy  2011    SLF: large hh, distal esophageal web narrowing to 19m s/p dilation to 123m . Colonoscopy  2011    SLF: pancolonic diverticulosis, large internal hemorrhoids  . Small bowel capsule  2008    negative  .  Esophagogastroduodenoscopy N/A 08/06/2013    SLF: 1. Stricture at the gastroesophageal junction 2. large hiatal hernia. 3. Mild erosive gastritis.  . Givens capsule study N/A 08/06/2013    INCOMPLETE-SMALL BOWLE ULCERS  . Knee surgery Right     Prior to Admission medications   Medication Sig Start Date End Date Taking? Authorizing Provider  allopurinol (ZYLOPRIM) 100 MG tablet Take 1 tablet by mouth 2 (two) times daily. 10/09/12  Yes Historical Provider, MD  amitriptyline (ELAVIL) 100 MG tablet Take 100 mg by mouth at bedtime.     Yes Historical Provider, MD  aspirin EC 81 MG tablet Take 81 mg by mouth daily.   Yes Historical Provider, MD  atorvastatin (LIPITOR) 40 MG tablet Take 1 tablet by mouth at bedtime.  10/09/12  Yes Historical Provider, MD  BIOTIN PO Take 1 capsule by mouth daily.    Yes Historical Provider, MD  cyanocobalamin (,VITAMIN B-12,) 1000 MCG/ML injection Inject 1,000 mcg into the muscle every 30 (thirty) days.   Yes Historical Provider, MD  esomeprazole (NEXIUM) 40 MG capsule TAKE 1 CAPSULE TWICE A DAY 30 MINUTES PRIOR TO MEALS 01/30/15  Yes LeMahala MenghiniPA-C  famotidine (PEPCID) 40 MG tablet TAKE 1 TABLET AT BEDTIME AS NEEDED TO CONTROL REFLUX Patient taking differently: Take 40 mg by mouth at bedtime as needed for heartburn or indigestion.  06/23/15  Yes Praveen Mannam, MD  fluticasone furoate-vilanterol (BREO ELLIPTA) 100-25 MCG/INH AEPB Inhale 1 puff into  the lungs daily.   Yes Historical Provider, MD  furosemide (LASIX) 40 MG tablet Take 40 mg by mouth daily.    Yes Historical Provider, MD  HUMALOG KWIKPEN 100 UNIT/ML SOPN Inject 14-18 Units into the skin 3 (three) times daily with meals. Based on SLIDING SCALE 10/06/12  Yes Historical Provider, MD  LANTUS SOLOSTAR 100 UNIT/ML SOPN Inject 26 Units into the skin at bedtime.  10/06/12  Yes Historical Provider, MD  loperamide (IMODIUM) 2 MG capsule Take 2 mg by mouth as needed for diarrhea or loose stools.   Yes Historical Provider,  MD  meclizine (ANTIVERT) 25 MG tablet Take 1 tablet (25 mg total) by mouth 3 (three) times daily as needed for dizziness. 06/28/15  Yes Nishant Dhungel, MD  metFORMIN (GLUCOPHAGE) 1000 MG tablet Take 1,000 mg by mouth 2 (two) times daily with a meal.   Yes Historical Provider, MD  metoprolol succinate (TOPROL-XL) 25 MG 24 hr tablet Take 50 mg by mouth daily.    Yes Historical Provider, MD  naproxen sodium (ANAPROX) 220 MG tablet Take 440 mg by mouth 2 (two) times daily with a meal.   Yes Historical Provider, MD  ondansetron (ZOFRAN) 4 MG tablet Take 1 tablet (4 mg total) by mouth every 8 (eight) hours as needed for nausea or vomiting. 03/29/15  Yes [name], MD  peg 3350 powder (MOVIPREP) 100 g SOLR Take 1 kit (200 g total) by mouth as directed. 12/29/15  Yes [name], MD  tiotropium (SPIRIVA) 18 MCG inhalation capsule Place 18 mcg into inhaler and inhale daily.   Yes Historical Provider, MD  Pancrelipase, Lip-Prot-Amyl, (ZENPEP) 40000 units CPEP Take 2 capsules with meals and 1 with snacks Patient not taking: Reported on 01/23/2016 12/29/15   Orvil Feil, NP    Allergies as of 12/29/2015  . (No Known Allergies)    Family History  Problem Relation Age of Onset  . Colon cancer Brother     diagnosed age 11. Living.   Marland Kitchen Ulcers Sister   . Diabetes Sister   . Heart attack Sister   . Kidney failure Sister   . Stroke Sister   . Ulcers Mother   . Diabetes Mother   . Heart attack Mother   . Stroke Mother   . Asthma Mother   . Heart attack Brother   . Asthma Sister   . Diabetes Brother   . Stroke Maternal Grandmother   . Heart attack Maternal Grandmother   . Heart attack Other     Social History   Social History  . Marital Status: Married    Spouse Name: N/A  . Number of Children: 4  . Years of Education: N/A   Occupational History  . Not on file.   Social History Main Topics  . Smoking status: Former Smoker -- 1.00 packs/day for 1 years    Types: Cigarettes    Quit  date: 08/05/1961  . Smokeless tobacco: Former Systems developer    Quit date: 06/30/1962     Comment: Quit smoking x 50 years  . Alcohol Use: No  . Drug Use: No  . Sexual Activity: Not on file   Other Topics Concern  . Not on file   Social History Narrative   HAS 4 SON-GRAND [place].    Review of Systems: See HPI, otherwise negative ROS   Physical Exam: BP 163/76 mmHg  Pulse 101  Temp(Src) 97.9 F (36.6 C) (Oral)  Resp 17  Ht  _0  (1.499 m)  Wt 220 lb (99.791 kg)  BMI 44.41 kg/m2  SpO2 93% General:   Alert,  pleasant and cooperative in NAD, resp putting on cpap mask Head:  Normocephalic and atraumatic. Neck:  Supple; Lungs:  Clear throughout to auscultation.    Heart:  Regular rate and rhythm. Abdomen:  Soft, nontender and nondistended. Normal bowel sounds, without guarding, and without rebound.   Neurologic:  Alert and  oriented x4;  grossly normal neurologically.  Impression/Plan:     Diarrhea  PLAN: TCS TODAY WITH BIOPSY

## 2016-01-26 NOTE — Discharge Instructions (Signed)
You had 3 polyps removed. You have MODERATE internal hemorrhoids and diverticulosis IN YOUR LEFT AND RIGHT COLON. I BIOPSIED YOUR COLON. THE LAST PART OF YOUR SMALL BOWEL IS NORMAL. NO OBVIOUS SOURCE FOR YOUR LOOSE STOOLS WAS IDENTIFIED. IT IS MOST LIKELY DUE TO LOOSE STOOL MOVING AROUND FORMED STOOL. I SAW FREQUENT STOOL BALLS IN YOUR COLON AND I BELIEVE THAT YOUR LOOSE STOOLS ARE DUE TO CONSTIPATION AND LOOSE STOOL MOVING AROUND IT.    [place] TO KEEP YOUR URINE LIGHT YELLOW.  FOLLOW A HIGH FIBER/DIABETIC DIET. AVOID ITEMS THAT CAUSE BLOATING. SEE INFO BELOW ON HIGH FIBER DIET.  ADD LINZESS 30 MINS PRIOR TO BREAKFAST. IT MAY CAUSE EXPLOSIVE DIARRHEA. PLEASE CALL WITH QUESTIONS OR CONCERNS.  YOUR BIOPSY RESULTS WILL BE AVAILABLE IN MY CHART JUN 27 AND MY OFFICE WILL CONTACT YOU IN 10-14 DAYS WITH YOUR RESULTS.   FOLLOW UP IN 3 MOS.   Next colonoscopy in 3 years.   Colonoscopy Care After Read the instructions outlined below and refer to this sheet in the next week. These discharge instructions provide you with general information on caring for yourself after you leave the hospital. While your treatment has been planned according to the most current medical practices available, unavoidable complications occasionally occur. If you have any problems or questions after discharge, call DR. Joseph Johns, [phone].  ACTIVITY  You may resume your regular activity, but move at a slower pace for the next 24 hours.   Take frequent rest periods for the next 24 hours.   Walking will help get rid of the air and reduce the bloated feeling in your belly (abdomen).   No driving for 24 hours (because of the medicine (anesthesia) used during the test).   You may shower.   Do not sign any important legal documents or operate any machinery for 24 hours (because of the anesthesia used during the test).    NUTRITION  Drink plenty of fluids.   You may resume your normal diet as instructed by your  doctor.   Begin with a light meal and progress to your normal diet. Heavy or fried foods are harder to digest and may make you feel sick to your stomach (nauseated).   Avoid alcoholic beverages for 24 hours or as instructed.    MEDICATIONS  You may resume your normal medications.   WHAT YOU CAN EXPECT TODAY  Some feelings of bloating in the abdomen.   Passage of more gas than usual.   Spotting of blood in your stool or on the toilet paper  .  IF YOU HAD POLYPS REMOVED DURING THE COLONOSCOPY:  Eat a soft diet IF YOU HAVE NAUSEA, BLOATING, ABDOMINAL PAIN, OR VOMITING.    FINDING OUT THE RESULTS OF YOUR TEST Not all test results are available during your visit. DR. Oneida Alar WILL CALL YOU WITHIN 14 DAYS OF YOUR PROCEDUE WITH YOUR RESULTS. Do not assume everything is normal if you have not heard from DR. Wei Poplaski, CALL HER OFFICE AT [phone].  SEEK IMMEDIATE MEDICAL ATTENTION AND CALL THE OFFICE: [phone] IF:  You have more than a spotting of blood in your stool.   Your belly is swollen (abdominal distention).   You are nauseated or vomiting.   You have a temperature over 101F.   You have abdominal pain or discomfort that is severe or gets worse throughout the day.  Polyps, Colon  A polyp is extra tissue that grows inside your body. Colon polyps grow in the large intestine. The large  intestine, also called the colon, is part of your digestive system. It is a long, hollow tube at the end of your digestive tract where your body makes and stores stool. Most polyps are not dangerous. They are benign. This means they are not cancerous. But over time, some types of polyps can turn into cancer. Polyps that are smaller than a pea are usually not harmful. But larger polyps could someday become or may already be cancerous. To be safe, doctors remove all polyps and test them.   PREVENTION There is not one sure way to prevent polyps. You might be able to lower your risk of getting  them if you:  Eat more fruits and vegetables and less fatty food.   Do not smoke.   Avoid alcohol.   Exercise every day.   Lose weight if you are overweight.   Eating more calcium and folate can also lower your risk of getting polyps. Some foods that are rich in calcium are milk, cheese, and broccoli. Some foods that are rich in folate are chickpeas, kidney beans, and spinach.   High-Fiber Diet A high-fiber diet changes your normal diet to include more whole grains, legumes, fruits, and vegetables. Changes in the diet involve replacing refined carbohydrates with unrefined foods. The calorie level of the diet is essentially unchanged. The Dietary Reference Intake (recommended amount) for adult males is 38 grams per day. For adult females, it is 25 grams per day. Pregnant and lactating women should consume 28 grams of fiber per day.Fiber is the intact part of a plant that is not broken down during digestion. Functional fiber is fiber that has been isolated from the plant to provide a beneficial effect in the body.  PURPOSE  Increase stool bulk.   Ease and regulate bowel movements.   Lower cholesterol.  REDUCE RISK OF COLON CANCER  INDICATIONS THAT YOU NEED MORE FIBER  Constipation and hemorrhoids.   Uncomplicated diverticulosis (intestine condition) and irritable bowel syndrome.   Weight management.   As a protective measure against hardening of the arteries (atherosclerosis), diabetes, and cancer.   GUIDELINES FOR INCREASING FIBER IN THE DIET  Start adding fiber to the diet slowly. A gradual increase of about 5 more grams (2 slices of whole-wheat bread, 2 servings of most fruits or vegetables, or 1 bowl of high-fiber cereal) per day is best. Too rapid an increase in fiber may result in constipation, flatulence, and bloating.   Drink enough water and fluids to keep your urine clear or pale yellow. Water, juice, or caffeine-free drinks are recommended. Not drinking enough fluid  may cause constipation.   Eat a variety of high-fiber foods rather than one type of fiber.   Try to increase your intake of fiber through using high-fiber foods rather than fiber pills or supplements that contain small amounts of fiber.   The goal is to change the types of food eaten. Do not supplement your present diet with high-fiber foods, but replace foods in your present diet.   INCLUDE A VARIETY OF FIBER SOURCES  Replace refined and processed grains with whole grains, canned fruits with fresh fruits, and incorporate other fiber sources. White rice, white breads, and most bakery goods contain little or no fiber.   Brown whole-grain rice, buckwheat oats, and many fruits and vegetables are all good sources of fiber. These include: broccoli, Brussels sprouts, cabbage, cauliflower, beets, sweet potatoes, white potatoes (skin on), carrots, tomatoes, eggplant, squash, berries, fresh fruits, and dried fruits.   Cereals appear  to be the richest source of fiber. Cereal fiber is found in whole grains and bran. Bran is the fiber-rich outer coat of cereal grain, which is largely removed in refining. In whole-grain cereals, the bran remains. In breakfast cereals, the largest amount of fiber is found in those with "bran" in their names. The fiber content is sometimes indicated on the label.   You may need to include additional fruits and vegetables each day.   In baking, for 1 cup white flour, you may use the following substitutions:   1 cup whole-wheat flour minus 2 tablespoons.   1/2 cup white flour plus 1/2 cup whole-wheat flour.   Diverticulosis Diverticulosis is a common condition that develops when small pouches (diverticula) form in the wall of the colon. The risk of diverticulosis increases with age. It happens more often in people who eat a low-fiber diet. Most individuals with diverticulosis have no symptoms. Those individuals with symptoms usually experience belly (abdominal) pain,  constipation, or loose stools (diarrhea).  HOME CARE INSTRUCTIONS  Increase the amount of fiber in your diet as directed by your caregiver or dietician. This may reduce symptoms of diverticulosis.   Drink at least 6 to 8 glasses of water each day to prevent constipation.   Try not to strain when you have a bowel movement.   Avoiding nuts and seeds to prevent complications is NOT NECESSARY.   FOODS HAVING HIGH FIBER CONTENT INCLUDE:  Fruits. Apple, peach, pear, tangerine, raisins, prunes.   Vegetables. Brussels sprouts, asparagus, broccoli, cabbage, carrot, cauliflower, romaine lettuce, spinach, summer squash, tomato, winter squash, zucchini.   Starchy Vegetables. Baked beans, kidney beans, lima beans, split peas, lentils, potatoes (with skin).   Grains. Whole wheat bread, brown rice, bran flake cereal, plain oatmeal, white rice, shredded wheat, bran muffins.   SEEK IMMEDIATE MEDICAL CARE IF:  You develop increasing pain or severe bloating.   You have an oral temperature above 101F.   You develop vomiting or bowel movements that are bloody or black.   Hemorrhoids Hemorrhoids are dilated (enlarged) veins around the rectum. Sometimes clots will form in the veins. This makes them swollen and painful. These are called thrombosed hemorrhoids. Causes of hemorrhoids include:  Constipation.   Straining to have a bowel movement.   HEAVY LIFTING  HOME CARE INSTRUCTIONS  Eat a well balanced diet and drink 6 to 8 glasses of water every day to avoid constipation. You may also use a bulk laxative.   Avoid straining to have bowel movements.   Keep anal area dry and clean.   Do not use a donut shaped pillow or sit on the toilet for long periods. This increases blood pooling and pain.   Move your bowels when your body has the urge; this will require less straining and will decrease pain and pressure.

## 2016-01-30 ENCOUNTER — Encounter (HOSPITAL_COMMUNITY): Payer: Self-pay | Admitting: Gastroenterology

## 2016-02-12 ENCOUNTER — Telehealth: Payer: Self-pay | Admitting: Gastroenterology

## 2016-02-12 NOTE — Telephone Encounter (Signed)
ON RECALL  °

## 2016-02-12 NOTE — Telephone Encounter (Signed)
Please call pt. ShE had simple adenomas. HER LOOSE STOOLS IS MOST LIKELY DUE TO LOOSE STOOL MOVING AROUND FORMED STOOL. I SAW FREQUENT STOOL BALLS IN YOUR COLON AND I BELIEVE THAT YOUR LOOSE STOOLS ARE DUE TO CONSTIPATION AND LOOSE STOOL MOVING AROUND IT.    [place] TO KEEP YOUR URINE LIGHT YELLOW.  FOLLOW A HIGH FIBER/DIABETIC DIET. AVOID ITEMS THAT CAUSE BLOATING.    ADD LINZESS 30 MINS PRIOR TO BREAKFAST. IT MAY CAUSE EXPLOSIVE DIARRHEA. PLEASE CALL WITH QUESTIONS OR CONCERNS.    FOLLOW UP IN 3 MOS E30 OVERFLOW INCONTINENCE/IBS.  Next colonoscopy in 3 years.

## 2016-02-12 NOTE — Telephone Encounter (Signed)
Reminder in epic °

## 2016-02-12 NOTE — Telephone Encounter (Signed)
Tried to call and Vm not set up. Mailing a letter to call.

## 2016-02-29 ENCOUNTER — Telehealth: Payer: Self-pay | Admitting: Gastroenterology

## 2016-02-29 NOTE — Telephone Encounter (Signed)
Pt called to check on her colonoscopy results. Please call [phone]

## 2016-02-29 NOTE — Telephone Encounter (Signed)
See results of 02/12/2016. Pt is aware.

## 2016-03-05 ENCOUNTER — Encounter: Payer: Self-pay | Admitting: Gastroenterology

## 2016-04-02 ENCOUNTER — Other Ambulatory Visit (HOSPITAL_COMMUNITY): Payer: PRIVATE HEALTH INSURANCE

## 2016-04-02 ENCOUNTER — Encounter (HOSPITAL_COMMUNITY): Payer: Managed Care, Other (non HMO) | Attending: Oncology

## 2016-04-02 ENCOUNTER — Encounter (HOSPITAL_COMMUNITY): Payer: Self-pay | Admitting: Oncology

## 2016-04-02 ENCOUNTER — Encounter (HOSPITAL_COMMUNITY): Payer: Medicare Other | Attending: Oncology | Admitting: Oncology

## 2016-04-02 VITALS — BP 162/70 | HR 108 | Temp 98.1°F | Resp 22 | Ht 59.0 in | Wt 232.0 lb

## 2016-04-02 DIAGNOSIS — Z452 Encounter for adjustment and management of vascular access device: Secondary | ICD-10-CM | POA: Diagnosis not present

## 2016-04-02 DIAGNOSIS — E611 Iron deficiency: Secondary | ICD-10-CM | POA: Diagnosis not present

## 2016-04-02 DIAGNOSIS — E538 Deficiency of other specified B group vitamins: Secondary | ICD-10-CM

## 2016-04-02 DIAGNOSIS — D500 Iron deficiency anemia secondary to blood loss (chronic): Secondary | ICD-10-CM | POA: Diagnosis present

## 2016-04-02 DIAGNOSIS — Z95828 Presence of other vascular implants and grafts: Secondary | ICD-10-CM

## 2016-04-02 DIAGNOSIS — N183 Chronic kidney disease, stage 3 unspecified: Secondary | ICD-10-CM

## 2016-04-02 DIAGNOSIS — E539 Vitamin B deficiency, unspecified: Secondary | ICD-10-CM

## 2016-04-02 LAB — COMPREHENSIVE METABOLIC PANEL
ALT: 18 U/L (ref 14–54)
AST: 24 U/L (ref 15–41)
Albumin: 3.5 g/dL (ref 3.5–5.0)
Alkaline Phosphatase: 99 U/L (ref 38–126)
Anion gap: 8 (ref 5–15)
BUN: 20 mg/dL (ref 6–20)
CHLORIDE: 102 mmol/L (ref 101–111)
CO2: 29 mmol/L (ref 22–32)
Calcium: 8.7 mg/dL — ABNORMAL LOW (ref 8.9–10.3)
Creatinine, Ser: 1.05 mg/dL — ABNORMAL HIGH (ref 0.44–1.00)
GFR, EST NON AFRICAN AMERICAN: 52 mL/min — AB (ref >60)
Glucose, Bld: 160 mg/dL — ABNORMAL HIGH (ref 65–99)
POTASSIUM: 4.4 mmol/L (ref 3.5–5.1)
SODIUM: 139 mmol/L (ref 135–145)
Total Bilirubin: 0.9 mg/dL (ref 0.3–1.2)
Total Protein: 7.5 g/dL (ref 6.5–8.1)

## 2016-04-02 LAB — VITAMIN B12: Vitamin B-12: 417 pg/mL (ref 180–914)

## 2016-04-02 LAB — CBC WITH DIFFERENTIAL/PLATELET
BASOS ABS: 0.1 10*3/uL (ref 0.0–0.1)
Basophils Relative: 1 %
EOS ABS: 0.4 10*3/uL (ref 0.0–0.7)
EOS PCT: 5 %
HCT: 33.9 % — ABNORMAL LOW (ref 36.0–46.0)
Hemoglobin: 10.9 g/dL — ABNORMAL LOW (ref 12.0–15.0)
LYMPHS PCT: 19 %
Lymphs Abs: 1.7 10*3/uL (ref 0.7–4.0)
MCH: 28.2 pg (ref 26.0–34.0)
MCHC: 32.2 g/dL (ref 30.0–36.0)
MCV: 87.8 fL (ref 78.0–100.0)
MONO ABS: 0.6 10*3/uL (ref 0.1–1.0)
Monocytes Relative: 7 %
Neutro Abs: 6.3 10*3/uL (ref 1.7–7.7)
Neutrophils Relative %: 68 %
PLATELETS: 309 10*3/uL (ref 150–400)
RBC: 3.86 MIL/uL — ABNORMAL LOW (ref 3.87–5.11)
RDW: 13.3 % (ref 11.5–15.5)
WBC: 9 10*3/uL (ref 4.0–10.5)

## 2016-04-02 LAB — IRON AND TIBC
IRON: 55 ug/dL (ref 28–170)
SATURATION RATIOS: 20 % (ref 10.4–31.8)
TIBC: 279 ug/dL (ref 250–450)
UIBC: 224 ug/dL

## 2016-04-02 LAB — FERRITIN: FERRITIN: 134 ng/mL (ref 11–307)

## 2016-04-02 MED ORDER — HEPARIN SOD (PORK) LOCK FLUSH 100 UNIT/ML IV SOLN
500.0000 [IU] | Freq: Once | INTRAVENOUS | Status: AC
Start: 2016-04-02 — End: 2016-04-02
  Administered 2016-04-02: 500 [IU] via INTRAVENOUS

## 2016-04-02 MED ORDER — SODIUM CHLORIDE 0.9% FLUSH
10.0000 mL | INTRAVENOUS | Status: DC | PRN
  Administered 2016-04-02: 10 mL via INTRAVENOUS
  Filled 2016-04-02: qty 10

## 2016-04-02 MED ORDER — HEPARIN SOD (PORK) LOCK FLUSH 100 UNIT/ML IV SOLN
INTRAVENOUS | Status: AC
  Filled 2016-04-02: qty 5

## 2016-04-02 MED ORDER — CYANOCOBALAMIN 1000 MCG/ML IJ SOLN: 1000.0000 ug | INTRAMUSCULAR | 1 refills | Status: DC

## 2016-04-02 MED ORDER — CYANOCOBALAMIN 1000 MCG/ML IJ SOLN: 1000.0000 ug | INTRAMUSCULAR | 11 refills | Status: DC

## 2016-04-02 NOTE — Assessment & Plan Note (Addendum)
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 Tressie Stalker at [facility].  Labs today: CBC diff, iron/TIBC, ferritin.  I personally reviewed and went over laboratory results with the patient.  The results are noted within this dictation.  Labs in 3 and 6 months: CBC diff, iron/TIBC, ferritin.

## 2016-04-02 NOTE — Progress Notes (Signed)
[name] presented for Portacath access and flush. Portacath located right chest wall accessed with  H 20 needle. Good blood return present. Portacath flushed with 1ml NS and 500U/32ml Heparin and needle removed intact. Procedure without incident. Patient tolerated procedure well.  Labs drawn per orders.  Vitals stable, discharged ambulatory home

## 2016-04-02 NOTE — Progress Notes (Signed)
[facility] Hematology/Oncology Consultation   Name: [name]      MRN: [id number]   Date: 04/02/2016 Time:10:27 PM   REFERRING PHYSICIAN:  Everardo All, MD (Medical Oncology)  REASON FOR CONSULT:  Transfer of hematologic care.   DIAGNOSIS:  B12 deficiency and iron deficiency anemia.  HISTORY OF PRESENT ILLNESS:   [name] is a 70 y.o. female with a medical history significant for B12 deficiency, iron deficiency anemia, COPD, DM, HTN, hyperlipidemia, obesity who is referred to the [facility] for B12 deficiency and iron deficiency anemia secondary to GI blood loss and possible malabsorption. She has received IV iron in the past. She was last seen by Dr. Tressie Stalker in December 2016. He notes persistent elevation of MMA, felt to be secondary to her CKD.   She notes SOB that is at baseline.  She denies any abnormal cough or sputum production.  She is followed by pulmonary at [facility].   I personally reviewed and went over laboratory results with the patient.  The results are noted within this dictation.  Chart is reviewed in Care everywhere.  Review of Systems  Constitutional: Negative for chills, fever and weight loss.  HENT: Negative.   Eyes: Negative.   Respiratory: Positive for cough and shortness of breath.   Cardiovascular: Negative.  Negative for chest pain.  Gastrointestinal: Negative.   Genitourinary: Negative.   Musculoskeletal: Negative.   Skin: Negative.   Neurological: Negative.  Negative for weakness.  Endo/Heme/Allergies: Negative.   Psychiatric/Behavioral: Negative.   14 point review of systems was performed and is negative except as detailed under history of present illness and above   PAST MEDICAL HISTORY:   Past Medical History:  Diagnosis Date  . Chronic back pain   . COPD (chronic obstructive pulmonary disease) ([place])   . Degenerative disc disease   . Essential hypertension, benign   . GERD  (gastroesophageal reflux disease)   . Gout   . Hiatal hernia 07/27/2013  . History of diverticulitis of colon   . History of hiatal hernia   . Iron deficiency anemia   . Iron deficiency anemia due to chronic blood loss 12/25/2009   APR 2015: NL FERRITIN/Hb     . Irritable bowel syndrome   . Lumbar radiculopathy   . Mixed hyperlipidemia   . Neuropathy ([place])   . Ovarian cancer ([place]) 04/02/2016  . Sleep apnea   . Type 2 diabetes mellitus ([place])   . Vitamin B deficiency 12/25/2009   Qualifier: Diagnosis of  By: Sofie Rower    . Vitamin B12 deficiency     ALLERGIES: No Known Allergies    MEDICATIONS: I have reviewed the patient's current medications.    Current Outpatient Prescriptions on File Prior to Visit  Medication Sig Dispense Refill  . allopurinol (ZYLOPRIM) 100 MG tablet Take 1 tablet by mouth 2 (two) times daily.    Marland Kitchen amitriptyline (ELAVIL) 100 MG tablet Take 100 mg by mouth at bedtime.      Marland Kitchen aspirin EC 81 MG tablet Take 81 mg by mouth daily.    Marland Kitchen atorvastatin (LIPITOR) 40 MG tablet Take 1 tablet by mouth at bedtime.     Marland Kitchen BIOTIN PO Take 1 capsule by mouth daily.     . famotidine (PEPCID) 40 MG tablet TAKE 1 TABLET AT BEDTIME AS NEEDED TO CONTROL REFLUX (Patient taking differently: Take 40 mg by mouth at bedtime as needed for heartburn or indigestion. )  90 tablet 1  . fluticasone furoate-vilanterol (BREO ELLIPTA) 100-25 MCG/INH AEPB Inhale 1 puff into the lungs daily.    . furosemide (LASIX) 40 MG tablet Take 40 mg by mouth daily.     Marland Kitchen LANTUS SOLOSTAR 100 UNIT/ML SOPN Inject 26 Units into the skin at bedtime.     Marland Kitchen linaclotide (LINZESS) 72 MCG capsule Take 1 capsule (72 mcg total) by mouth daily before breakfast. 30 capsule 11  . loperamide (IMODIUM) 2 MG capsule Take 2 mg by mouth as needed for diarrhea or loose stools.    . meclizine (ANTIVERT) 25 MG tablet Take 1 tablet (25 mg total) by mouth 3 (three) times daily as needed for dizziness. 30 tablet 0  . metFORMIN  (GLUCOPHAGE) 1000 MG tablet Take 1,000 mg by mouth 2 (two) times daily with a meal.    . metoprolol succinate (TOPROL-XL) 25 MG 24 hr tablet Take 50 mg by mouth daily.     . naproxen sodium (ANAPROX) 220 MG tablet Take 440 mg by mouth 2 (two) times daily with a meal.    . ondansetron (ZOFRAN) 4 MG tablet Take 1 tablet (4 mg total) by mouth every 8 (eight) hours as needed for nausea or vomiting. 45 tablet 2  . tiotropium (SPIRIVA) 18 MCG inhalation capsule Place 18 mcg into inhaler and inhale daily.     No current facility-administered medications on file prior to visit.      PAST SURGICAL HISTORY Past Surgical History:  Procedure Laterality Date  . ABDOMINAL HYSTERECTOMY    . BACK SURGERY    . Benign breast cysts    . CHOLECYSTECTOMY    . COLONOSCOPY  10/01/2006   SLF:Pan colonic diverticulosis and moderate internal hemorrhoids/ Otherwise no polyps, masses, inflammatory changes or AVMs/  . COLONOSCOPY  2011   SLF: pancolonic diverticulosis, large internal hemorrhoids  . COLONOSCOPY N/A 01/26/2016   Procedure: COLONOSCOPY;  Surgeon: [name], MD;  Location: [facility];  Service: Endoscopy;  Laterality: N/A;  830   . ESOPHAGOGASTRODUODENOSCOPY  11/19/2006   SLF: Large hiatal hernia without evidence of Cameron ulcers/. Distal esophageal stricture, which allowed the gastroscope to pass without resistance.  A 16 mm Savary later passed with mild resistance/ Normal stomach.sb bx negative  . ESOPHAGOGASTRODUODENOSCOPY  10/01/2006   [id number] hiatal hernia.  Distal esophagus without evidence of   erythema, ulceration or Barrett's esophagus  . ESOPHAGOGASTRODUODENOSCOPY  2011   SLF: large hh, distal esophageal web narrowing to 67mm s/p dilation to 57mm  . ESOPHAGOGASTRODUODENOSCOPY N/A 08/06/2013   SLF: 1. Stricture at the gastroesophageal junction 2. large hiatal hernia. 3. Mild erosive gastritis.  Marland Kitchen GIVENS CAPSULE STUDY N/A 08/06/2013   INCOMPLETE-SMALL BOWLE ULCERS  . KNEE SURGERY Right    . PARTIAL HYSTERECTOMY  1978  . small bowel capsule  2008   negative  . TONSILLECTOMY AND ADENOIDECTOMY    . Two back surgeries/fusion    . UMBILICAL HERNIA REPAIR  2010    FAMILY HISTORY: Family History  Problem Relation Age of Onset  . Colon cancer Brother     diagnosed age 53. Living.   Marland Kitchen Ulcers Sister   . Diabetes Sister   . Heart attack Sister   . Kidney failure Sister   . Stroke Sister   . Ulcers Mother   . Diabetes Mother   . Heart attack Mother   . Stroke Mother   . Asthma Mother   . Heart attack Brother   . Asthma Sister   .  Diabetes Brother   . Stroke Maternal Grandmother   . Heart attack Maternal Grandmother   . Heart attack Other     SOCIAL HISTORY:  reports that she quit smoking about 54 years ago. Her smoking use included Cigarettes. She has a 1.00 pack-year smoking history. She has never used smokeless tobacco. She reports that she does not drink alcohol or use drugs. She is married x 11 years after her first husband died from cancer.  She has 4 children, all boys, with grandchildren and great-grandchildren.  She is religious and is associated with [employer].  She is retired.  Social History   Social History  . Marital status: Married    Spouse name: N/A  . Number of children: 4  . Years of education: N/A   Social History Main Topics  . Smoking status: Former Smoker    Packs/day: 1.00    Years: 1.00    Types: Cigarettes    Quit date: 08/05/1961  . Smokeless tobacco: Never Used     Comment: Quit smoking x 50 years  . Alcohol use No  . Drug use: No  . Sexual activity: Not Asked   Other Topics Concern  . None   Social History Narrative   HAS 4 SON-GRAND KIDS AND GREAT GRAND KIDS.    PERFORMANCE STATUS: The patient's performance status is 1 - Symptomatic but completely ambulatory  PHYSICAL EXAM: Most Recent Vital Signs: Blood pressure (!) 162/70, pulse (!) 108, temperature 98.1 F (36.7 C), temperature source Oral, resp. rate (!)  22, height 4\' 11"  (1.499 m), weight 232 lb (105.2 kg), SpO2 95 %. General appearance: alert, cooperative, appears stated age, no distress, moderately obese and unaccompanied Head: Normocephalic, without obvious abnormality, atraumatic Eyes: negative findings: lids and lashes normal, conjunctivae and sclerae normal and corneas clear Throat: normal findings: lips normal without lesions and buccal mucosa normal Neck: no adenopathy and supple, symmetrical, trachea midline Lungs: clear to auscultation bilaterally and normal percussion bilaterally Heart: regular rate and rhythm, S1, S2 normal, no murmur, click, rub or gallop Abdomen: soft, non-tender; bowel sounds normal; no masses,  no organomegaly Extremities: edema 1+ pitting edema in lower extremities bilaterally Skin: Skin color, texture, turgor normal. No rashes or lesions Lymph nodes: Cervical, supraclavicular, and axillary nodes normal. Neurologic: Grossly normal  LABORATORY DATA:  No results found for this or any previous visit (from the past 48 hour(s)).         RADIOGRAPHY: No results found.     PATHOLOGY:  N/A   ASSESSMENT/PLAN:  Anemia CKD, stage 3 Vitamin B12 deficiency Vitamin B12 deficiency, requiring IM replacement every 30 days which she self-administers at home.  She needs a refill on her B12 injections.  I have written two Rxs.  One to [facility] locally 1000 mcg every 30 days with 1 refill and the other to [facility] 1000 mcg every 30 days with 11 refills.  Both are escribed to her pharmacies.  Labs today: CBC diff, B12.    Labs in 6 months: CBC diff, B12.  She asks about immunizations moving forward.  She is encouraged to continue with annual influenza vaccine.  Regarding her pneumovax, we will try to get her immunization record and we can assist with her ongoing immunizations if needed. She is currently unsure if she has received her pneumovax or prevnar.   Return in 6 months for follow-up.  Iron  deficiency anemia due to chronic blood loss Iron deficiency anemia secondary to chronic GI blood loss +/-  malabsorption, having requiring IV iron replacement in 2016 with Dr. Tressie Stalker at [facility].  Labs today: CBC diff, iron/TIBC, ferritin.  She will be notified of results of her labs when available and if needed we will arrange for IV iron replacement.   Labs in 3 and 6 months: CBC diff, iron/TIBC, ferritin.   ORDERS PLACED FOR THIS ENCOUNTER: Orders Placed This Encounter  Procedures  . CBC with Differential  . Comprehensive metabolic panel  . Vitamin B12  . Iron and TIBC  . Ferritin  . CBC with Differential  . Comprehensive metabolic panel  . Iron and TIBC  . Ferritin  . Vitamin B12  . CBC with Differential  . Comprehensive metabolic panel  . Iron and TIBC  . Ferritin  . Vitamin B12  . CBC with Differential  . Iron and TIBC  . Ferritin    All questions were answered. The patient knows to call the clinic with any problems, questions or concerns. We can certainly see the patient much sooner if necessary.  This note is electronically signed [id number] Cyril Mourning, MD  04/02/2016 10:27 PM

## 2016-04-02 NOTE — Assessment & Plan Note (Addendum)
Vitamin B12 deficiency, requiring IM replacement every 30 days which she self-administers at home.  She needs a refill on her B12 injections.  I have written two Rxs.  One to [facility] locally 1000 mcg every 30 days with 1 refill and the other to [facility] 1000 mcg every 30 days with 11 refills.  Both are escribed to her pharmacies.  Labs today: CBC diff, B12.  I personally reviewed and went over laboratory results with the patient.  The results are noted within this dictation.  Labs in 6 months: CBC diff, B12.  She asks about immunizations moving forward.  She is encouraged to continue with annual influenza vaccine.  Regarding her pneumovax, we will try to get her immunization record and we can assist with er ongoing immunizations if needed.  Return in 6 months for follow-up.

## 2016-04-02 NOTE — Patient Instructions (Signed)
[facility] at [facility] Discharge Instructions  RECOMMENDATIONS MADE BY THE CONSULTANT AND ANY TEST RESULTS WILL BE SENT TO YOUR REFERRING PHYSICIAN.  Port flush with labs today Follow up as scheduled   Thank you for choosing [facility] at [facility] to provide your oncology and hematology care.  To afford each patient quality time with our provider, please arrive at least 15 minutes before your scheduled appointment time.   Beginning January 23rd 2017 lab work for the Cancer Center will be done in the  Main lab at [place] on 1st floor. If you have a lab appointment with the Cancer Center please come in thru the  Main Entrance and check in at the main information desk  You need to re-schedule your appointment should you arrive 10 or more minutes late.  We strive to give you quality time with our providers, and arriving late affects you and other patients whose appointments are after yours.  Also, if you no show three or more times for appointments you may be dismissed from the clinic at the providers discretion.     Again, thank you for choosing [facility].  Our hope is that these requests will decrease the amount of time that you wait before being seen by our physicians.       _____________________________________________________________  Should you have questions after your visit to [facility], please contact our office at [phone] between the hours of 8:30 a.m. and 4:30 p.m.  Voicemails left after 4:30 p.m. will not be returned until the following business day.  For prescription refill requests, have your pharmacy contact our office.         Resources For Cancer Patients and their Caregivers ? American Cancer Society: Can assist with transportation, wigs, general needs, runs Look Good Feel Better.        1-888-227-6333 ? Cancer Care: Provides financial assistance, online support groups,  medication/co-pay assistance.  1-800-813-HOPE (4673) ? [facility] Assists [place] cancer patients and their families through emotional , educational and financial support.  336-427-4357 ? [place] DSS Where to apply for food stamps, Medicaid and utility assistance. 336-342-1394 ? RCATS: Transportation to medical appointments. 336-347-2287 ? Social Security Administration: May apply for disability if have a Stage IV cancer. 336-342-7796 1-800-772-1213 ? [place] Aging, Disability and Transit Services: Assists with nutrition, care and transit needs. 336-349-2343  Cancer Center Support Programs: @10RELATIVEDAYS@ > Cancer Support Group  2nd Tuesday of the month 1pm-2pm, Journey Room  > Creative Journey  3rd Tuesday of the month 1130am-1pm, Journey Room  > Look Good Feel Better  1st Wednesday of the month 10am-12 noon, Journey Room (Call American [employer] to register 1-800-395-5775)    

## 2016-04-02 NOTE — Patient Instructions (Signed)
[place] at [facility] Discharge Instructions  RECOMMENDATIONS MADE BY THE CONSULTANT AND ANY TEST RESULTS WILL BE SENT TO YOUR REFERRING PHYSICIAN.  You were seen by Kirby Crigler PA-C today.  You had your port flushed and labs drawn. An Rx for IM b-12 was sent to your pharmacy as well.  We will see you back in 3 months for labs and 6-8 weeks for another port flush.  Return in 6 months for follow up.   Thank you for choosing [place] at [facility] to provide your oncology and hematology care.  To afford each patient quality time with our provider, please arrive at least 15 minutes before your scheduled appointment time.   Beginning January 23rd 2017 lab work for the [facility] will be done in the  Main lab at [facility] on 1st floor. If you have a lab appointment with the [facility] please come in thru the  Main Entrance and check in at the main information desk  You need to re-schedule your appointment should you arrive 10 or more minutes late.  We strive to give you quality time with our providers, and arriving late affects you and other patients whose appointments are after yours.  Also, if you no show three or more times for appointments you may be dismissed from the clinic at the providers discretion.     Again, thank you for choosing [facility].  Our hope is that these requests will decrease the amount of time that you wait before being seen by our physicians.       _____________________________________________________________  Should you have questions after your visit to [facility], please contact our office at [phone] between the hours of 8:30 a.m. and 4:30 p.m.  Voicemails left after 4:30 p.m. will not be returned until the following business day.  For prescription refill requests, have your pharmacy contact our office.         Resources For Cancer Patients and their  Caregivers ? American Cancer Society: Can assist with transportation, wigs, general needs, runs Look Good Feel Better.        701 493 2256 ? Cancer Care: Provides financial assistance, online support groups, medication/co-pay assistance.  1-800-813-HOPE 701-459-2149) ? [facility] Assists [place] cancer patients and their families through emotional , educational and financial support.  318 560 3423 ? [place] DSS Where to apply for food stamps, Medicaid and utility assistance. (512)647-7682 ? RCATS: Transportation to medical appointments. (515) 149-2454 ? Social Security Administration: May apply for disability if have a Stage IV cancer. 804-217-2281 351-617-3501 ? [employer], Disability and Transit Services: Assists with nutrition, care and transit needs. [place] Support Programs: @10RELATIVEDAYS @ > Cancer Support Group  2nd Tuesday of the month 1pm-2pm, Journey Room  > Creative Journey  3rd Tuesday of the month 1130am-1pm, Journey Room  > Look Good Feel Better  1st Wednesday of the month 10am-12 noon, Journey Room (Call [facility] to register [phone])

## 2016-04-03 ENCOUNTER — Other Ambulatory Visit (HOSPITAL_COMMUNITY): Payer: Self-pay | Admitting: *Deleted

## 2016-04-03 ENCOUNTER — Telehealth (HOSPITAL_COMMUNITY): Payer: Self-pay | Admitting: *Deleted

## 2016-04-03 ENCOUNTER — Telehealth (HOSPITAL_COMMUNITY): Payer: Self-pay | Admitting: Hematology & Oncology

## 2016-04-03 ENCOUNTER — Telehealth: Payer: Self-pay | Admitting: Gastroenterology

## 2016-04-03 DIAGNOSIS — E539 Vitamin B deficiency, unspecified: Secondary | ICD-10-CM

## 2016-04-03 MED ORDER — CYANOCOBALAMIN 1000 MCG/ML IJ SOLN: 1000.0000 ug | INTRAMUSCULAR | 2 refills | Status: DC

## 2016-04-03 NOTE — Telephone Encounter (Signed)
Called dr Electa Sniff office to request shot recs for pt. Had to leave a vm with Mrs Marcha Dutton

## 2016-04-03 NOTE — Telephone Encounter (Signed)
Pt called to ask if she needs to continue taking Linzess and if so, she needs a prescription called into [facility] in [place] and a 90 day supply called into [facility].

## 2016-04-03 NOTE — Telephone Encounter (Signed)
I spoke to pt and she just needs a 90 supply of the Linzess 72 mcg to [facility]. They will no longer cover the Rx's at [facility]. ( she had an Rx with 11 refills). I told her I would leave her some samples and just have Rx sent to [facility]. Leaving #12 samples at front for pick up.

## 2016-04-04 MED ORDER — LINACLOTIDE 72 MCG PO CAPS: 72.0000 ug | ORAL_CAPSULE | Freq: Every day | ORAL | 3 refills | Status: DC

## 2016-04-04 NOTE — Telephone Encounter (Signed)
Rx was sent to pharmacy per patient request.

## 2016-04-04 NOTE — Addendum Note (Signed)
Addended by: Gordy Levan, Charlyne Robertshaw A on: 04/04/2016 09:49 AM   Modules accepted: Orders

## 2016-04-20 ENCOUNTER — Encounter (HOSPITAL_COMMUNITY): Payer: Self-pay | Admitting: Oncology

## 2016-04-25 ENCOUNTER — Encounter: Payer: Self-pay | Admitting: Gastroenterology

## 2016-04-25 ENCOUNTER — Ambulatory Visit (INDEPENDENT_AMBULATORY_CARE_PROVIDER_SITE_OTHER): Payer: Medicare Other | Admitting: Gastroenterology

## 2016-04-25 DIAGNOSIS — R1310 Dysphagia, unspecified: Secondary | ICD-10-CM

## 2016-04-25 DIAGNOSIS — K5901 Slow transit constipation: Secondary | ICD-10-CM | POA: Diagnosis not present

## 2016-04-25 DIAGNOSIS — K5900 Constipation, unspecified: Secondary | ICD-10-CM | POA: Insufficient documentation

## 2016-04-25 NOTE — Assessment & Plan Note (Signed)
DUE TO IBS-MIXED AND ASSOCIATED WITH LOWER ABDOMINAL PAIN. LINZESS 72 MCG TOO MUCH.  OPEN LINZESS CAPSULE. PLACE GRANULES IN 2 TEASPOONS OF WATER. STIR IT FOR 30 SECONDS. TAKE 1 TSP OF THE WATER on Tuesday, Thursday,A ND Saturday FOR 14 DAYS. IF NO SATISFACTORY BM THEN TAKE 1.5 TSP ON TUE, THUR, AND SAT. YOU DO NOT NEED TO TAKE THE GRANULES THE MEDICINE IS IN THE WATER.  DRINK WATER TO KEEP YOUR URINE LIGHT YELLOW.  FOLLOW UP IN 3 MOS.

## 2016-04-25 NOTE — Assessment & Plan Note (Signed)
SYMPTOMS NOT IDEALLY CONTROLLED. LAST EGD/DIL 2015.  FOLLOW A SOFT MECHANICAL DIET.  MEATS SHOULD BE CHOPPED OR GROUND ONLY. DO NOT EAT CHUNKS OF ANYTHING.   HANDOUT GIVEN. COMPLETE SWALLOWING TEST(FEES) WITH SPEECH PATHOLOGY.

## 2016-04-25 NOTE — Progress Notes (Signed)
Subjective:    Patient ID: [name], female    DOB: 06/06/1946, 70 y.o.   MRN: [id number]  [name], MD  HPI Linzess works too well. Cause watery, explosive diarrhea after taking 72 mcg. Usually before Linzess bowel would move 2 BMS a day but they were hard and difficult to pass. SEP 2017: G[place] TO [place]. NEXT MO GOING TO [place]. NAUSEA: ONCE IN A WHILE BUT IT'S MUCH BETTER. LOWER OF PAIN(CRAMPY AND STABBING) IN LOWER BELLY. LAST COUPLE OF WEEKS FEELS LIKE SHE HAS TROUBLE SWALLOWING. CAN GET IT UP OR DOWN. NO LIQUID THROUGH HER NOSE. HEARTBURN CONTROLLED. NO PROBLEMS WITH TRANSPORTING FOOD FROM BACK TO FRONT AND FEELS LIKE IT GETTING HUNG. MOSTLY SOLIDS. FEEL LIKE IF CAN GET IT ON ESOPHAGUS IT GOES DOWN JUST FINE.  PT DENIES FEVER, CHILLS, HEMATOCHEZIA,  vomiting, melena, CHEST PAIN, SHORTNESS OF BREATH,  CHANGE IN BOWEL IN HABITS, problems swallowing, OR, heartburn or indigestion.   Past Medical History:  Diagnosis Date  . Chronic back pain   . COPD (chronic obstructive pulmonary disease) ([place])   . Degenerative disc disease   . Essential hypertension, benign   . GERD (gastroesophageal reflux disease)   . Gout   . Hiatal hernia 07/27/2013  . History of diverticulitis of colon   . History of hiatal hernia   . Iron deficiency anemia   . Iron deficiency anemia due to chronic blood loss 12/25/2009   APR 2015: NL FERRITIN/Hb     . Irritable bowel syndrome   . Lumbar radiculopathy   . Mixed hyperlipidemia   . Neuropathy ([place])   . Ovarian cancer ([place]) 04/02/2016  . Sleep apnea   . Type 2 diabetes mellitus ([place])   . Vitamin B deficiency 12/25/2009   Qualifier: Diagnosis of  By: Sofie Rower    . Vitamin B12 deficiency     Past Surgical History:  Procedure Laterality Date  . ABDOMINAL HYSTERECTOMY    . BACK SURGERY    . Benign breast cysts    . CHOLECYSTECTOMY    . COLONOSCOPY  10/01/2006   SLF:Pan colonic diverticulosis and moderate  internal hemorrhoids/ Otherwise no polyps, masses, inflammatory changes or AVMs/  . COLONOSCOPY  2011   SLF: pancolonic diverticulosis, large internal hemorrhoids  . COLONOSCOPY N/A 01/26/2016   Procedure: COLONOSCOPY;  Surgeon: [name], MD;  Location: [facility];  Service: Endoscopy;  Laterality: N/A;  830   . ESOPHAGOGASTRODUODENOSCOPY  11/19/2006   SLF: Large hiatal hernia without evidence of Cameron ulcers/. Distal esophageal stricture, which allowed the gastroscope to pass without resistance.  A 16 mm Savary later passed with mild resistance/ Normal stomach.sb bx negative  . ESOPHAGOGASTRODUODENOSCOPY  10/01/2006   ZDG:UYQIH hiatal hernia.  Distal esophagus without evidence of   erythema, ulceration or Barrett's esophagus  . ESOPHAGOGASTRODUODENOSCOPY  2011   SLF: large hh, distal esophageal web narrowing to 65mm s/p dilation to 81mm  . ESOPHAGOGASTRODUODENOSCOPY N/A 08/06/2013   SLF: 1. Stricture at the gastroesophageal junction 2. large hiatal hernia. 3. Mild erosive gastritis.  Marland Kitchen GIVENS CAPSULE STUDY N/A 08/06/2013   INCOMPLETE-SMALL BOWLE ULCERS  . KNEE SURGERY Right   . PARTIAL HYSTERECTOMY  1978  . small bowel capsule  2008   negative  . TONSILLECTOMY AND ADENOIDECTOMY    . Two back surgeries/fusion    . UMBILICAL HERNIA REPAIR  2010    No Known Allergies    Current Outpatient Prescriptions  Medication Sig Dispense Refill  .  allopurinol (ZYLOPRIM) 100 MG tablet Take 1 tablet by mouth 2 (two) times daily.    Marland Kitchen amitriptyline (ELAVIL) 100 MG tablet Take 100 mg by mouth at bedtime.      Marland Kitchen aspirin EC 81 MG tablet Take 81 mg by mouth daily.    Marland Kitchen atorvastatin (LIPITOR) 40 MG tablet Take 1 tablet by mouth at bedtime.     Marland Kitchen BIOTIN PO Take 1 capsule by mouth daily.     . cyanocobalamin (,VITAMIN B-12,) 1000 MCG/ML injection Inject 1 mL (1,000 mcg total) into the muscle every 30 (thirty) days.    Marland Kitchen esomeprazole (NEXIUM) 40 MG capsule Take 40 mg by mouth daily at 12 noon.      . fluticasone furoate-vilanterol (BREO ELLIPTA) 100-25 MCG/INH AEPB Inhale 1 puff into the lungs daily.    . furosemide (LASIX) 40 MG tablet Take 40 mg by mouth daily.     . insulin lispro (HUMALOG) 100 UNIT/ML KiwkPen Use as directed [place] by sliding scale with meals. MDD=50 units    . LANTUS SOLOSTAR 100 UNIT/ML SOPN Inject 26 Units into the skin at bedtime.     Marland Kitchen linaclotide (LINZESS) 72 MCG capsule Take 1 capsule (72 mcg total) by mouth daily before breakfast.    . loperamide (IMODIUM) 2 MG capsule Take 2 mg by mouth as needed for diarrhea or loose stools.    . meclizine (ANTIVERT) 25 MG tablet Take 1 tablet (25 mg total) by mouth 3 (three) times daily as needed for dizziness.    . metFORMIN (GLUCOPHAGE) 1000 MG tablet Take 1,000 mg by mouth 2 (two) times daily with a meal.    . metoprolol succinate (TOPROL-XL) 25 MG 24 hr tablet Take 50 mg by mouth daily.     . naproxen sodium (ANAPROX) 220 MG tablet Take 440 mg by mouth 2 (two) times daily with a meal. ONCE A WEEK   . ondansetron (ZOFRAN) 4 MG tablet Take 1 tablet (4 mg total) by mouth every 8 (eight) hours as needed for nausea or vomiting. NONE IN A WEEK   . tiotropium (SPIRIVA) 18 MCG inhalation capsule Place 18 mcg into inhaler and inhale daily.    . B-D ULTRA-FINE 33 LANCETS MISC USE TO CHECK BG 3 TIMES A DAY    . cyanocobalamin (,VITAMIN B-12,) 1000 MCG/ML injection Inject 1 mL (1,000 mcg total) into the muscle every 30 (thirty) days.    . cyanocobalamin (,VITAMIN B-12,) 1000 MCG/ML injection Inject 1 mL (1,000 mcg total) into the muscle every 30 (thirty) days.    . famotidine (PEPCID) 40 MG tablet TAKE 1 TABLET AT BEDTIME AS NEEDED TO CONTROL REFLUX (Patient taking differently: Take 40 mg by mouth at bedtime as needed for heartburn or indigestion. ) OFF AND ON    Review of Systems PER HPI OTHERWISE ALL SYSTEMS ARE NEGATIVE.    Objective:   Physical Exam  Constitutional: She is oriented to person, place, and time. She appears  well-developed and well-nourished. No distress.  HENT:  Head: Normocephalic and atraumatic.  Mouth/Throat: Oropharynx is clear and moist. No oropharyngeal exudate.  POOR DENTITION  Eyes: Pupils are equal, round, and reactive to light. No scleral icterus.  Neck: Normal range of motion. Neck supple.  Cardiovascular: Normal rate, regular rhythm and normal heart sounds.   Pulmonary/Chest: Effort normal and breath sounds normal. No respiratory distress.  Abdominal: Soft. Bowel sounds are normal. She exhibits no distension. There is no tenderness.  Musculoskeletal: She exhibits edema.  Lymphadenopathy:  She has no cervical adenopathy.  Neurological: She is alert and oriented to person, place, and time.  NO  NEW FOCAL DEFICITS  Psychiatric: She has a normal mood and affect.  Vitals reviewed.     Assessment & Plan:

## 2016-04-25 NOTE — Progress Notes (Signed)
cc'ed to pcp °

## 2016-04-25 NOTE — Progress Notes (Signed)
ON RECALL  °

## 2016-04-25 NOTE — Patient Instructions (Signed)
OPEN LINZESS CAPSULE. PLACE GRANULES IN 2 TEASPOONS OF WATER. STIR IT FOR 30 SECONDS. TAKE 1 TSP OF THE WATER on Tuesday, Thursday,A ND Saturday FOR 14 DAYS. IF NO SATISFACTORY BM THEN TAKE 1.5 TSP ON TUE, THUR, AND SAT. YOU DO NOT NEED TO TAKE THE GRANULES THE MEDICINE IS IN THE WATER.   DRINK WATER TO KEEP YOUR URINE LIGHT YELLOW.  FOLLOW A SOFT MECHANICAL DIET.  MEATS SHOULD BE CHOPPED OR GROUND ONLY. DO NOT EAT CHUNKS OF ANYTHING. SEE INFO BELOW.  COMPLETE SWALLOWING TEST WITH SPEECH PATHOLOGY.  FOLLOW UP IN 3 MOS.   SOFT MECHANICAL DIET This SOFT MECHANICAL DIET is restricted to:  Foods that are moist, soft-textured, and easy to chew and swallow.   Meats that are ground or are minced no larger than one-quarter inch pieces. Meats are moist with gravy or sauce added.   Foods that do not include bread or bread-like textures except soft pancakes, well-moistened with syrup or sauce.   Textures with some chewing ability required.   Casseroles without rice.   Cooked vegetables that are less than half an inch in size and easily mashed with a fork. No cooked corn, peas, broccoli, cauliflower, cabbage, Brussels sprouts, asparagus, or other fibrous, non-tender or rubbery cooked vegetables.   Canned fruit except for pineapple. Fruit must be cut into pieces no larger than half an inch in size.   Foods that do not include nuts, seeds, coconut, or sticky textures.

## 2016-04-26 ENCOUNTER — Other Ambulatory Visit: Payer: Self-pay

## 2016-04-26 DIAGNOSIS — R1310 Dysphagia, unspecified: Secondary | ICD-10-CM

## 2016-05-01 ENCOUNTER — Ambulatory Visit: Payer: Managed Care, Other (non HMO) | Admitting: Gastroenterology

## 2016-05-07 ENCOUNTER — Inpatient Hospital Stay (HOSPITAL_COMMUNITY)
Admission: EM | Admit: 2016-05-07 | Discharge: 2016-05-10 | DRG: 291 | Disposition: A | Discharge status: Home Health | Payer: Managed Care, Other (non HMO) | Attending: Internal Medicine | Admitting: Internal Medicine

## 2016-05-07 ENCOUNTER — Encounter (HOSPITAL_COMMUNITY): Payer: Self-pay | Admitting: *Deleted

## 2016-05-07 DIAGNOSIS — D500 Iron deficiency anemia secondary to blood loss (chronic): Secondary | ICD-10-CM | POA: Diagnosis present

## 2016-05-07 DIAGNOSIS — Z800 Family history of malignant neoplasm of digestive organs: Secondary | ICD-10-CM

## 2016-05-07 DIAGNOSIS — I130 Hypertensive heart and chronic kidney disease with heart failure and stage 1 through stage 4 chronic kidney disease, or unspecified chronic kidney disease: Principal | ICD-10-CM | POA: Diagnosis present

## 2016-05-07 DIAGNOSIS — K449 Diaphragmatic hernia without obstruction or gangrene: Secondary | ICD-10-CM | POA: Diagnosis present

## 2016-05-07 DIAGNOSIS — Z6841 Body Mass Index (BMI) 40.0 and over, adult: Secondary | ICD-10-CM

## 2016-05-07 DIAGNOSIS — Z794 Long term (current) use of insulin: Secondary | ICD-10-CM

## 2016-05-07 DIAGNOSIS — N182 Chronic kidney disease, stage 2 (mild): Secondary | ICD-10-CM | POA: Diagnosis present

## 2016-05-07 DIAGNOSIS — N183 Chronic kidney disease, stage 3 unspecified: Secondary | ICD-10-CM | POA: Diagnosis present

## 2016-05-07 DIAGNOSIS — I5033 Acute on chronic diastolic (congestive) heart failure: Secondary | ICD-10-CM | POA: Diagnosis present

## 2016-05-07 DIAGNOSIS — Z825 Family history of asthma and other chronic lower respiratory diseases: Secondary | ICD-10-CM

## 2016-05-07 DIAGNOSIS — E1142 Type 2 diabetes mellitus with diabetic polyneuropathy: Secondary | ICD-10-CM | POA: Diagnosis present

## 2016-05-07 DIAGNOSIS — Z823 Family history of stroke: Secondary | ICD-10-CM

## 2016-05-07 DIAGNOSIS — Z7951 Long term (current) use of inhaled steroids: Secondary | ICD-10-CM

## 2016-05-07 DIAGNOSIS — Z8249 Family history of ischemic heart disease and other diseases of the circulatory system: Secondary | ICD-10-CM

## 2016-05-07 DIAGNOSIS — M6281 Muscle weakness (generalized): Secondary | ICD-10-CM

## 2016-05-07 DIAGNOSIS — Z87891 Personal history of nicotine dependence: Secondary | ICD-10-CM

## 2016-05-07 DIAGNOSIS — Z8543 Personal history of malignant neoplasm of ovary: Secondary | ICD-10-CM

## 2016-05-07 DIAGNOSIS — R0600 Dyspnea, unspecified: Secondary | ICD-10-CM | POA: Diagnosis not present

## 2016-05-07 DIAGNOSIS — E1122 Type 2 diabetes mellitus with diabetic chronic kidney disease: Secondary | ICD-10-CM | POA: Diagnosis present

## 2016-05-07 DIAGNOSIS — I10 Essential (primary) hypertension: Secondary | ICD-10-CM | POA: Diagnosis present

## 2016-05-07 DIAGNOSIS — Z7982 Long term (current) use of aspirin: Secondary | ICD-10-CM

## 2016-05-07 DIAGNOSIS — J441 Chronic obstructive pulmonary disease with (acute) exacerbation: Secondary | ICD-10-CM | POA: Diagnosis present

## 2016-05-07 DIAGNOSIS — Z79899 Other long term (current) drug therapy: Secondary | ICD-10-CM

## 2016-05-07 DIAGNOSIS — Z9981 Dependence on supplemental oxygen: Secondary | ICD-10-CM

## 2016-05-07 DIAGNOSIS — Z833 Family history of diabetes mellitus: Secondary | ICD-10-CM

## 2016-05-07 DIAGNOSIS — E538 Deficiency of other specified B group vitamins: Secondary | ICD-10-CM | POA: Diagnosis present

## 2016-05-07 DIAGNOSIS — J9601 Acute respiratory failure with hypoxia: Secondary | ICD-10-CM | POA: Diagnosis present

## 2016-05-07 DIAGNOSIS — K2100 Gastro-esophageal reflux disease with esophagitis, without bleeding: Secondary | ICD-10-CM | POA: Diagnosis present

## 2016-05-07 DIAGNOSIS — K219 Gastro-esophageal reflux disease without esophagitis: Secondary | ICD-10-CM | POA: Diagnosis present

## 2016-05-07 DIAGNOSIS — E782 Mixed hyperlipidemia: Secondary | ICD-10-CM | POA: Diagnosis present

## 2016-05-07 DIAGNOSIS — G4733 Obstructive sleep apnea (adult) (pediatric): Secondary | ICD-10-CM | POA: Diagnosis present

## 2016-05-07 NOTE — ED Provider Notes (Signed)
[facility] DEPT Provider Note   CSN: [id number] Arrival date & time: 05/07/16  2343  By signing my name below, I, Soijett Blue, attest that this documentation has been prepared under the direction and in the presence of Rolland Porter, MD. Electronically Signed: Soijett Blue, ED Scribe. 05/07/16. 12:10 AM.  Time seen 23:55 PM   History   Chief Complaint Chief Complaint  Patient presents with  . Shortness of Breath    HPI  [name] is a 70 y.o. female with a PMHx of COPD, CHF, who presents to the Emergency Department complaining of SOB onset 2 weeks ago. Pt notes that she initially had a cough that progressed into SOB. Pt reports that she is on 2 L of oxygen at night and PRN during the day with increased usage recently Pt is having associated symptoms of non-productive cough, increased leg swelling over the past few weeks,denies chest pain, but has pressure/tightness sensation, wheezing, and abdominal bloating. She notes that she has tried albuterol inhaler with Rita relief of her symptoms recently. She denies fever, difficulty urinating, and any other symptoms. Pt states that she takes a daily diuretic.   The history is provided by the patient. Rita language interpreter was used.    Pt PCP is Dr. [name] B. Tapper. [facility] in [place] .Pt states that her Pulmonologist is located in [facility].  Past Medical History:  Diagnosis Date  . Chronic back pain   . COPD (chronic obstructive pulmonary disease) ([place])   . Degenerative disc disease   . Essential hypertension, benign   . GERD (gastroesophageal reflux disease)   . Gout   . Hiatal hernia 07/27/2013  . History of diverticulitis of colon   . History of hiatal hernia   . Iron deficiency anemia   . Iron deficiency anemia due to chronic blood loss 12/25/2009   APR 2015: NL FERRITIN/Hb     . Irritable bowel syndrome   . Lumbar radiculopathy   . Mixed hyperlipidemia   . Neuropathy ([place])   . Ovarian cancer ([place])  04/02/2016  . Sleep apnea   . Type 2 diabetes mellitus ([place])   . Vitamin B deficiency 12/25/2009   Qualifier: Diagnosis of  By: Sofie Rower    . Vitamin B12 deficiency     Patient Active Problem List   Diagnosis Date Noted  . COPD exacerbation ([place]) 05/08/2016  . Hypertension 05/08/2016  . Acute on chronic diastolic CHF (congestive heart failure) ([place]) 05/08/2016  . CKD (chronic kidney disease), stage II 05/08/2016  . COPD with acute exacerbation ([place]) 05/08/2016  . Constipation due to slow transit 04/25/2016  . FH: colon cancer 12/29/2015  . COPD (chronic obstructive pulmonary disease) ([place]) 06/28/2015  . Peripheral vertigo 06/27/2015  . Dizziness 06/27/2015  . Nausea with vomiting 03/29/2015  . Hemorrhoids, internal, with bleeding 11/10/2013  . Lesion of tongue 08/18/2013  . Acid reflux   . DM neuropathies ([place])   . OBESITY 12/26/2009  . Obstructive chronic bronchitis without exacerbation ([place]) 12/26/2009  . Dysphagia 12/26/2009  . DM type 2 with diabetic peripheral neuropathy ([place]) 12/25/2009  . Vitamin B deficiency 12/25/2009  . Iron deficiency anemia due to chronic blood loss 12/25/2009  . Essential hypertension, benign 12/25/2009  . DEGENERATIVE DISC DISEASE 12/25/2009  . SLEEP APNEA 12/25/2009  . HYPERLIPIDEMIA, MILD, HX OF 12/25/2009    Past Surgical History:  Procedure Laterality Date  . ABDOMINAL HYSTERECTOMY    . BACK SURGERY    . Benign breast cysts    .  CHOLECYSTECTOMY    . COLONOSCOPY  10/01/2006   SLF:Pan colonic diverticulosis and moderate internal hemorrhoids/ Otherwise Rita polyps, masses, inflammatory changes or AVMs/  . COLONOSCOPY  2011   SLF: pancolonic diverticulosis, large internal hemorrhoids  . COLONOSCOPY N/A 01/26/2016   Procedure: COLONOSCOPY;  Surgeon: [name], MD;  Location: [facility];  Service: Endoscopy;  Laterality: N/A;  830   . ESOPHAGOGASTRODUODENOSCOPY  11/19/2006   SLF: Large hiatal hernia without evidence of  Cameron ulcers/. Distal esophageal stricture, which allowed the gastroscope to pass without resistance.  A 16 mm Savary later passed with mild resistance/ Normal stomach.sb bx negative  . ESOPHAGOGASTRODUODENOSCOPY  10/01/2006   ION:GEXBM hiatal hernia.  Distal esophagus without evidence of   erythema, ulceration or Barrett's esophagus  . ESOPHAGOGASTRODUODENOSCOPY  2011   SLF: large hh, distal esophageal web narrowing to 68mm s/p dilation to 48mm  . ESOPHAGOGASTRODUODENOSCOPY N/A 08/06/2013   SLF: 1. Stricture at the gastroesophageal junction 2. large hiatal hernia. 3. Mild erosive gastritis.  Marland Kitchen GIVENS CAPSULE STUDY N/A 08/06/2013   INCOMPLETE-SMALL BOWLE ULCERS  . KNEE SURGERY Right   . PARTIAL HYSTERECTOMY  1978  . small bowel capsule  2008   negative  . TONSILLECTOMY AND ADENOIDECTOMY    . Two back surgeries/fusion    . UMBILICAL HERNIA REPAIR  2010    OB History    Rita data available       Home Medications    Prior to Admission medications   Medication Sig Start Date End Date Taking? Authorizing Provider  allopurinol (ZYLOPRIM) 100 MG tablet Take 1 tablet by mouth 2 (two) times daily. 10/09/12   Historical Provider, MD  amitriptyline (ELAVIL) 100 MG tablet Take 100 mg by mouth at bedtime.      Historical Provider, MD  aspirin EC 81 MG tablet Take 81 mg by mouth daily.    Historical Provider, MD  atorvastatin (LIPITOR) 40 MG tablet Take 1 tablet by mouth at bedtime.  10/09/12   Historical Provider, MD  B-D ULTRA-FINE 33 LANCETS MISC USE TO CHECK BG 3 TIMES A DAY 01/19/16   Historical Provider, MD  BIOTIN PO Take 1 capsule by mouth daily.     Historical Provider, MD  cyanocobalamin (,VITAMIN B-12,) 1000 MCG/ML injection Inject 1 mL (1,000 mcg total) into the muscle every 30 (thirty) days. 04/02/16   Baird Cancer, PA-C  cyanocobalamin (,VITAMIN B-12,) 1000 MCG/ML injection Inject 1 mL (1,000 mcg total) into the muscle every 30 (thirty) days. 04/02/16   Baird Cancer, PA-C    cyanocobalamin (,VITAMIN B-12,) 1000 MCG/ML injection Inject 1 mL (1,000 mcg total) into the muscle every 30 (thirty) days. 04/03/16   Baird Cancer, PA-C  esomeprazole (NEXIUM) 40 MG capsule Take 40 mg by mouth daily at 12 noon.    Historical Provider, MD  famotidine (PEPCID) 40 MG tablet TAKE 1 TABLET AT BEDTIME AS NEEDED TO CONTROL REFLUX Patient taking differently: Take 40 mg by mouth at bedtime as needed for heartburn or indigestion.  06/23/15   Praveen Mannam, MD  fluticasone furoate-vilanterol (BREO ELLIPTA) 100-25 MCG/INH AEPB Inhale 1 puff into the lungs daily.    Historical Provider, MD  furosemide (LASIX) 40 MG tablet Take 40 mg by mouth daily.     Historical Provider, MD  insulin lispro (HUMALOG) 100 UNIT/ML KiwkPen Use as directed [place] by sliding scale with meals. MDD=50 units 03/12/16   Historical Provider, MD  LANTUS SOLOSTAR 100 UNIT/ML SOPN Inject 26 Units into the  skin at bedtime.  10/06/12   Historical Provider, MD  linaclotide Rolan Lipa) 72 MCG capsule Take 1 capsule (72 mcg total) by mouth daily before breakfast. 04/04/16   Carlis Stable, NP  loperamide (IMODIUM) 2 MG capsule Take 2 mg by mouth as needed for diarrhea or loose stools.    Historical Provider, MD  meclizine (ANTIVERT) 25 MG tablet Take 1 tablet (25 mg total) by mouth 3 (three) times daily as needed for dizziness. 06/28/15   Nishant Dhungel, MD  metFORMIN (GLUCOPHAGE) 1000 MG tablet Take 1,000 mg by mouth 2 (two) times daily with a meal.    Historical Provider, MD  metoprolol succinate (TOPROL-XL) 25 MG 24 hr tablet Take 50 mg by mouth daily.     Historical Provider, MD  naproxen sodium (ANAPROX) 220 MG tablet Take 440 mg by mouth 2 (two) times daily with a meal.    Historical Provider, MD  ondansetron (ZOFRAN) 4 MG tablet Take 1 tablet (4 mg total) by mouth every 8 (eight) hours as needed for nausea or vomiting. 03/29/15   [name], MD  tiotropium (SPIRIVA) 18 MCG inhalation capsule Place 18 mcg into inhaler and  inhale daily.    Historical Provider, MD    Family History Family History  Problem Relation Age of Onset  . Colon cancer Brother     diagnosed age 73. Living.   Marland Kitchen Ulcers Sister   . Diabetes Sister   . Heart attack Sister   . Kidney failure Sister   . Stroke Sister   . Ulcers Mother   . Diabetes Mother   . Heart attack Mother   . Stroke Mother   . Asthma Mother   . Heart attack Brother   . Asthma Sister   . Diabetes Brother   . Stroke Maternal Grandmother   . Heart attack Maternal Grandmother   . Heart attack Other     Social History Social History  Substance Use Topics  . Smoking status: Former Smoker    Packs/day: 1.00    Years: 1.00    Types: Cigarettes    Quit date: 08/05/1961  . Smokeless tobacco: Never Used     Comment: Quit smoking x 50 years  . Alcohol use Rita  lives at home Lives with spouse Uses oxygen 2 lpm [place] qhs   Allergies   Review of patient's allergies indicates Rita known allergies.   Review of Systems Review of Systems  Constitutional: Negative for fever.  Respiratory: Positive for cough (non-productive), shortness of breath and wheezing.   Cardiovascular: Positive for chest pain and leg swelling.  Gastrointestinal: Positive for abdominal distention.  Genitourinary: Negative for difficulty urinating.  All other systems reviewed and are negative.    Physical Exam Updated Vital Signs BP (!) 161/50   Pulse (!) 125   Temp 98 F (36.7 C) (Oral)   Resp 22   Ht 4\' 11"  (1.499 m)   Wt 230 lb (104.3 kg)   SpO2 91%   BMI 46.45 kg/m   Vital signs normal except for tachycardia, hypertension   Physical Exam  Constitutional: She is oriented to person, place, and time. She appears well-developed and well-nourished.  Non-toxic appearance. She does not appear ill. She appears distressed.  HENT:  Head: Normocephalic and atraumatic.  Right Ear: External ear normal.  Left Ear: External ear normal.  Nose: Nose normal. Rita mucosal edema or  rhinorrhea.  Mouth/Throat: Oropharynx is clear and moist and mucous membranes are normal. Rita dental abscesses or uvula  swelling.  Eyes: Conjunctivae and EOM are normal. Pupils are equal, round, and reactive to light.  Neck: Normal range of motion and full passive range of motion without pain. Neck supple.  Cardiovascular: Normal rate, regular rhythm and normal heart sounds.  Exam reveals Rita gallop and Rita friction rub.   Rita murmur heard. Pulmonary/Chest: Accessory muscle usage present. Tachypnea noted. She is in respiratory distress. She has wheezes. She has rhonchi. She has Rita rales. She exhibits Rita tenderness and Rita crepitus.  Diffuse wheezes and rhonchi. Audible wheezing. Crowing. Coughing on respirations.   Abdominal: Soft. Normal appearance and bowel sounds are normal. She exhibits Rita distension. There is Rita tenderness. There is Rita rebound and Rita guarding.  Musculoskeletal: Normal range of motion. She exhibits Rita tenderness.       Right lower leg: She exhibits edema.       Left lower leg: She exhibits edema.  Moves all extremities well.   Neurological: She is alert and oriented to person, place, and time. She has normal strength. Rita cranial nerve deficit.  Skin: Skin is warm, dry and intact. Rita rash noted. Rita erythema. Rita pallor.  Psychiatric: She has a normal mood and affect. Her speech is normal and behavior is normal. Her mood appears not anxious.  Nursing note and vitals reviewed.   ED Treatments / Results   Results for orders placed or performed during the hospital encounter of 05/07/16  Comprehensive metabolic panel  Result Value Ref Range   Sodium 138 135 - 145 mmol/L   Potassium 4.0 3.5 - 5.1 mmol/L   Chloride 105 101 - 111 mmol/L   CO2 27 22 - 32 mmol/L   Glucose, Bld 120 (H) 65 - 99 mg/dL   BUN 30 (H) 6 - 20 mg/dL   Creatinine, Ser 1.27 (H) 0.44 - 1.00 mg/dL   Calcium 7.9 (L) 8.9 - 10.3 mg/dL   Total Protein 7.3 6.5 - 8.1 g/dL   Albumin 3.6 3.5 - 5.0 g/dL   AST 15 15 -  41 U/L   ALT 16 14 - 54 U/L   Alkaline Phosphatase 96 38 - 126 U/L   Total Bilirubin 0.5 0.3 - 1.2 mg/dL   GFR calc non Af Amer 41 (L) >60 mL/min   GFR calc Af Amer 48 (L) >60 mL/min   Anion gap 6 5 - 15  Troponin I  Result Value Ref Range   Troponin I <0.03 <0.03 ng/mL  Brain natriuretic peptide  Result Value Ref Range   B Natriuretic Peptide 79.0 0.0 - 100.0 pg/mL  CBC with Differential  Result Value Ref Range   WBC 16.1 (H) 4.0 - 10.5 K/uL   RBC 4.01 3.87 - 5.11 MIL/uL   Hemoglobin 11.1 (L) 12.0 - 15.0 g/dL   HCT 34.4 (L) 36.0 - 46.0 %   MCV 85.8 78.0 - 100.0 fL   MCH 27.7 26.0 - 34.0 pg   MCHC 32.3 30.0 - 36.0 g/dL   RDW 13.5 11.5 - 15.5 %   Platelets 311 150 - 400 K/uL   Neutrophils Relative % 65 %   Neutro Abs 10.6 (H) 1.7 - 7.7 K/uL   Lymphocytes Relative 24 %   Lymphs Abs 3.8 0.7 - 4.0 K/uL   Monocytes Relative 9 %   Monocytes Absolute 1.4 (H) 0.1 - 1.0 K/uL   Eosinophils Relative 2 %   Eosinophils Absolute 0.3 0.0 - 0.7 K/uL   Basophils Relative 0 %   Basophils Absolute 0.0 0.0 - 0.1  K/uL   Laboratory interpretation all normal except leukocytosis, stable chronic renal insufficiency   EKG Interpretation  Date/Time:  Wednesday May 08 2016 00:03:29 EDT Ventricular Rate:  110 PR Interval:    QRS Duration: 106 QT Interval:  341 QTC Calculation: 462 R Axis:   18 Text Interpretation:  Sinus tachycardia Abnormal T, consider ischemia, lateral leads Baseline wander in lead(s) V3 Rita significant change since last tracing 27 Jun 2015 Confirmed by [facility]  MD-I, Shakeitha Umbaugh ([id number]) on 05/08/2016 12:25:01 AM      Dg Chest Port 1 View  Result Date: 05/08/2016 CLINICAL DATA:  Shortness of breath for 2 weeks, cough and wheezing. EXAM: PORTABLE CHEST 1 VIEW COMPARISON:  Chest radiograph June 20, 2015 FINDINGS: Cardiac silhouette is moderately enlarged but can't unchanged. Mediastinal silhouette is nonsuspicious. Large air-filled hiatal hernia. Mild bronchitic changes without  pleural effusion or focal consolidation. Similar strandy densities RIGHT lung base. Rita pneumothorax. Single lumen RIGHT chest Port-A-Cath with distal tip projecting in proximal superior vena cava. Large body habitus. Osseous structures are nonsuspicious. IMPRESSION: Mild bronchitic changes without focal consolidation. RIGHT lung base atelectasis versus scarring. Stable cardiomegaly. Large hiatal hernia. Electronically Signed   By: Elon Alas M.D.   On: 05/08/2016 02:14     Procedures Procedures (including critical care time)  Medications Ordered in ED Medications  albuterol (PROVENTIL,VENTOLIN) solution continuous neb (15 mg/hr Nebulization Given 05/08/16 0014)  ipratropium (ATROVENT) nebulizer solution 0.5 mg (0.5 mg Nebulization Given 05/08/16 0014)  methylPREDNISolone sodium succinate (SOLU-MEDROL) 125 mg/2 mL injection 125 mg (125 mg Intravenous Given 05/08/16 0055)  furosemide (LASIX) injection 80 mg (80 mg Intravenous Given 05/08/16 0055)  nitroGLYCERIN (NITROGLYN) 2 % ointment 1 inch (1 inch Topical Given 05/08/16 0056)  ipratropium-albuterol (DUONEB) 0.5-2.5 (3) MG/3ML nebulizer solution 3 mL (3 mLs Nebulization Given 05/08/16 0222)  albuterol (PROVENTIL) (2.5 MG/3ML) 0.083% nebulizer solution 2.5 mg (2.5 mg Nebulization Given 05/08/16 0222)     Initial Impression / Assessment and Plan / ED Course  I have reviewed the triage vital signs and the nursing notes.  Pertinent labs & imaging results that were available during my care of the patient were reviewed by me and considered in my medical decision making (see chart for details).  Clinical Course    DIAGNOSTIC STUDIES: Oxygen Saturation is 95% on RA, adequate, by my interpretation.    COORDINATION OF CARE: 12:08 AM Discussed treatment plan with pt at bedside which includes A continuous nebulizer treatment, lasix, NTG (for her blood pressure and her congestive heart failure), IV solu-medrol, EKG, CXR, and pt agreed to plan.  I  have reviewed her chest x-ray and she has a enlarged heart which is stable, without obvious infiltrate or increased pulmonary edema.  Recheck 01:50 AM after her continuous nebulizer. Pt is still having a tight cough, still has some accessory muscle use, audible wheezing at times. She reports Rita urine output after the IV lasix.  She was given a single nebulizer and we discussed admission and she is agreeable.   02:10 AM Dr Myna Hidalgo, hospitalist,  admit to tele   Final Clinical Impressions(s) / ED Diagnoses   Final diagnoses:  COPD exacerbation ([place])    Plan admission  CRITICAL CARE Performed by: Jame Seelig L Majd Tissue Total critical care time: 36 minutes Critical care time was exclusive of separately billable procedures and treating other patients. Critical care was necessary to treat or prevent imminent or life-threatening deterioration. Critical care was time spent personally by me on the following activities: development of  treatment plan with patient and/or surrogate as well as nursing, discussions with consultants, evaluation of patient's response to treatment, examination of patient, obtaining history from patient or surrogate, ordering and performing treatments and interventions, ordering and review of laboratory studies, ordering and review of radiographic studies, pulse oximetry and re-evaluation of patient's condition.   I personally performed the services described in this documentation, which was scribed in my presence. The recorded information has been reviewed and considered.  Rolland Porter, MD, Barbette Or, MD 05/08/16 [phone]

## 2016-05-07 NOTE — ED Triage Notes (Signed)
Pt c/o sob that has gotten progressively worse over the last few days; pt states she has had a cough for the last few weeks; pt c/o of some chest tightness

## 2016-05-08 ENCOUNTER — Encounter (HOSPITAL_COMMUNITY): Payer: Self-pay | Admitting: Family Medicine

## 2016-05-08 ENCOUNTER — Emergency Department (HOSPITAL_COMMUNITY): Payer: Managed Care, Other (non HMO)

## 2016-05-08 DIAGNOSIS — I5033 Acute on chronic diastolic (congestive) heart failure: Secondary | ICD-10-CM | POA: Diagnosis present

## 2016-05-08 DIAGNOSIS — E1122 Type 2 diabetes mellitus with diabetic chronic kidney disease: Secondary | ICD-10-CM | POA: Diagnosis present

## 2016-05-08 DIAGNOSIS — I10 Essential (primary) hypertension: Secondary | ICD-10-CM | POA: Diagnosis not present

## 2016-05-08 DIAGNOSIS — K219 Gastro-esophageal reflux disease without esophagitis: Secondary | ICD-10-CM

## 2016-05-08 DIAGNOSIS — D500 Iron deficiency anemia secondary to blood loss (chronic): Secondary | ICD-10-CM | POA: Diagnosis present

## 2016-05-08 DIAGNOSIS — Z6841 Body Mass Index (BMI) 40.0 and over, adult: Secondary | ICD-10-CM | POA: Diagnosis not present

## 2016-05-08 DIAGNOSIS — N182 Chronic kidney disease, stage 2 (mild): Secondary | ICD-10-CM | POA: Diagnosis present

## 2016-05-08 DIAGNOSIS — E782 Mixed hyperlipidemia: Secondary | ICD-10-CM | POA: Diagnosis present

## 2016-05-08 DIAGNOSIS — K449 Diaphragmatic hernia without obstruction or gangrene: Secondary | ICD-10-CM | POA: Diagnosis present

## 2016-05-08 DIAGNOSIS — Z833 Family history of diabetes mellitus: Secondary | ICD-10-CM | POA: Diagnosis not present

## 2016-05-08 DIAGNOSIS — E1142 Type 2 diabetes mellitus with diabetic polyneuropathy: Secondary | ICD-10-CM

## 2016-05-08 DIAGNOSIS — J441 Chronic obstructive pulmonary disease with (acute) exacerbation: Secondary | ICD-10-CM | POA: Diagnosis present

## 2016-05-08 DIAGNOSIS — I509 Heart failure, unspecified: Secondary | ICD-10-CM | POA: Diagnosis not present

## 2016-05-08 DIAGNOSIS — E538 Deficiency of other specified B group vitamins: Secondary | ICD-10-CM | POA: Diagnosis present

## 2016-05-08 DIAGNOSIS — Z9981 Dependence on supplemental oxygen: Secondary | ICD-10-CM | POA: Diagnosis not present

## 2016-05-08 DIAGNOSIS — Z8543 Personal history of malignant neoplasm of ovary: Secondary | ICD-10-CM | POA: Diagnosis not present

## 2016-05-08 DIAGNOSIS — I130 Hypertensive heart and chronic kidney disease with heart failure and stage 1 through stage 4 chronic kidney disease, or unspecified chronic kidney disease: Secondary | ICD-10-CM | POA: Diagnosis present

## 2016-05-08 DIAGNOSIS — J9601 Acute respiratory failure with hypoxia: Secondary | ICD-10-CM | POA: Diagnosis present

## 2016-05-08 DIAGNOSIS — Z87891 Personal history of nicotine dependence: Secondary | ICD-10-CM | POA: Diagnosis not present

## 2016-05-08 DIAGNOSIS — N183 Chronic kidney disease, stage 3 unspecified: Secondary | ICD-10-CM | POA: Diagnosis present

## 2016-05-08 DIAGNOSIS — R0600 Dyspnea, unspecified: Secondary | ICD-10-CM | POA: Diagnosis present

## 2016-05-08 DIAGNOSIS — Z7982 Long term (current) use of aspirin: Secondary | ICD-10-CM | POA: Diagnosis not present

## 2016-05-08 DIAGNOSIS — Z825 Family history of asthma and other chronic lower respiratory diseases: Secondary | ICD-10-CM | POA: Diagnosis not present

## 2016-05-08 DIAGNOSIS — Z79899 Other long term (current) drug therapy: Secondary | ICD-10-CM | POA: Diagnosis not present

## 2016-05-08 DIAGNOSIS — G4733 Obstructive sleep apnea (adult) (pediatric): Secondary | ICD-10-CM | POA: Diagnosis present

## 2016-05-08 DIAGNOSIS — Z794 Long term (current) use of insulin: Secondary | ICD-10-CM | POA: Diagnosis not present

## 2016-05-08 DIAGNOSIS — Z7951 Long term (current) use of inhaled steroids: Secondary | ICD-10-CM | POA: Diagnosis not present

## 2016-05-08 LAB — CBC WITH DIFFERENTIAL/PLATELET
BASOS ABS: 0 10*3/uL (ref 0.0–0.1)
Basophils Relative: 0 %
Eosinophils Absolute: 0.3 10*3/uL (ref 0.0–0.7)
Eosinophils Relative: 2 %
HCT: 34.4 % — ABNORMAL LOW (ref 36.0–46.0)
HEMOGLOBIN: 11.1 g/dL — AB (ref 12.0–15.0)
LYMPHS ABS: 3.8 10*3/uL (ref 0.7–4.0)
Lymphocytes Relative: 24 %
MCH: 27.7 pg (ref 26.0–34.0)
MCHC: 32.3 g/dL (ref 30.0–36.0)
MCV: 85.8 fL (ref 78.0–100.0)
Monocytes Absolute: 1.4 10*3/uL — ABNORMAL HIGH (ref 0.1–1.0)
Monocytes Relative: 9 %
NEUTROS ABS: 10.6 10*3/uL — AB (ref 1.7–7.7)
Neutrophils Relative %: 65 %
Platelets: 311 10*3/uL (ref 150–400)
RBC: 4.01 MIL/uL (ref 3.87–5.11)
RDW: 13.5 % (ref 11.5–15.5)
WBC: 16.1 10*3/uL — AB (ref 4.0–10.5)

## 2016-05-08 LAB — GLUCOSE, CAPILLARY
GLUCOSE-CAPILLARY: 313 mg/dL — AB (ref 65–99)
GLUCOSE-CAPILLARY: 217 mg/dL — AB (ref 65–99)
GLUCOSE-CAPILLARY: 230 mg/dL — AB (ref 65–99)
Glucose-Capillary: 289 mg/dL — ABNORMAL HIGH (ref 65–99)
Glucose-Capillary: 289 mg/dL — ABNORMAL HIGH (ref 65–99)

## 2016-05-08 LAB — COMPREHENSIVE METABOLIC PANEL
ALBUMIN: 3.6 g/dL (ref 3.5–5.0)
ALK PHOS: 96 U/L (ref 38–126)
ALT: 16 U/L (ref 14–54)
ANION GAP: 6 (ref 5–15)
AST: 15 U/L (ref 15–41)
BUN: 30 mg/dL — ABNORMAL HIGH (ref 6–20)
CALCIUM: 7.9 mg/dL — AB (ref 8.9–10.3)
CHLORIDE: 105 mmol/L (ref 101–111)
CO2: 27 mmol/L (ref 22–32)
Creatinine, Ser: 1.27 mg/dL — ABNORMAL HIGH (ref 0.44–1.00)
GFR calc Af Amer: 48 mL/min — ABNORMAL LOW (ref >60)
GFR calc non Af Amer: 41 mL/min — ABNORMAL LOW (ref >60)
GLUCOSE: 120 mg/dL — AB (ref 65–99)
POTASSIUM: 4 mmol/L (ref 3.5–5.1)
SODIUM: 138 mmol/L (ref 135–145)
Total Bilirubin: 0.5 mg/dL (ref 0.3–1.2)
Total Protein: 7.3 g/dL (ref 6.5–8.1)

## 2016-05-08 LAB — TROPONIN I
Troponin I: <0.03 ng/mL (ref <0.03)
Troponin I: 0.03 ng/mL (ref <0.03)
Troponin I: 0.05 ng/mL (ref <0.03)

## 2016-05-08 LAB — BRAIN NATRIURETIC PEPTIDE: B Natriuretic Peptide: 79 pg/mL (ref 0.0–100.0)

## 2016-05-08 MED ORDER — ALBUTEROL SULFATE (2.5 MG/3ML) 0.083% IN NEBU
2.5000 mg | INHALATION_SOLUTION | Freq: Once | RESPIRATORY_TRACT | Status: AC
  Administered 2016-05-08: 2.5 mg via RESPIRATORY_TRACT
  Filled 2016-05-08: qty 3

## 2016-05-08 MED ORDER — PANTOPRAZOLE SODIUM 40 MG PO TBEC
40.0000 mg | DELAYED_RELEASE_TABLET | Freq: Every day | ORAL | Status: DC
  Administered 2016-05-08 – 2016-05-10 (×3): 40 mg via ORAL
  Filled 2016-05-08 (×3): qty 1

## 2016-05-08 MED ORDER — ASPIRIN EC 81 MG PO TBEC
81.0000 mg | DELAYED_RELEASE_TABLET | Freq: Every day | ORAL | Status: DC
  Administered 2016-05-08 – 2016-05-10 (×3): 81 mg via ORAL
  Filled 2016-05-08 (×3): qty 1

## 2016-05-08 MED ORDER — FLUTICASONE FUROATE-VILANTEROL 100-25 MCG/INH IN AEPB
1.0000 | INHALATION_SPRAY | Freq: Every day | RESPIRATORY_TRACT | Status: DC
  Administered 2016-05-08 – 2016-05-10 (×3): 1 via RESPIRATORY_TRACT
  Filled 2016-05-08: qty 28

## 2016-05-08 MED ORDER — IPRATROPIUM-ALBUTEROL 0.5-2.5 (3) MG/3ML IN SOLN
3.0000 mL | RESPIRATORY_TRACT | Status: DC | PRN
  Filled 2016-05-08: qty 3

## 2016-05-08 MED ORDER — ALPRAZOLAM 0.5 MG PO TABS: 0.2500 mg | ORAL_TABLET | Freq: Two times a day (BID) | ORAL | Status: DC | PRN

## 2016-05-08 MED ORDER — NITROGLYCERIN 2 % TD OINT
1.0000 [in_us] | TOPICAL_OINTMENT | Freq: Once | TRANSDERMAL | Status: AC
  Administered 2016-05-08: 1 [in_us] via TOPICAL
  Filled 2016-05-08: qty 1

## 2016-05-08 MED ORDER — SODIUM CHLORIDE 0.9% FLUSH: 3.0000 mL | INTRAVENOUS | Status: DC | PRN

## 2016-05-08 MED ORDER — LEVOFLOXACIN IN D5W 250 MG/50ML IV SOLN
250.0000 mg | INTRAVENOUS | Status: DC
  Filled 2016-05-08: qty 50

## 2016-05-08 MED ORDER — IPRATROPIUM-ALBUTEROL 0.5-2.5 (3) MG/3ML IN SOLN
3.0000 mL | Freq: Once | RESPIRATORY_TRACT | Status: AC
  Administered 2016-05-08: 3 mL via RESPIRATORY_TRACT
  Filled 2016-05-08: qty 3

## 2016-05-08 MED ORDER — LINACLOTIDE 72 MCG PO CAPS
72.0000 ug | ORAL_CAPSULE | Freq: Every day | ORAL | Status: DC
  Filled 2016-05-08: qty 1

## 2016-05-08 MED ORDER — FAMOTIDINE 20 MG PO TABS: 40.0000 mg | ORAL_TABLET | Freq: Every evening | ORAL | Status: DC | PRN

## 2016-05-08 MED ORDER — FUROSEMIDE 10 MG/ML IJ SOLN
40.0000 mg | Freq: Two times a day (BID) | INTRAMUSCULAR | Status: DC
  Administered 2016-05-08 – 2016-05-10 (×4): 40 mg via INTRAVENOUS
  Filled 2016-05-08 (×4): qty 4

## 2016-05-08 MED ORDER — ALBUTEROL (5 MG/ML) CONTINUOUS INHALATION SOLN
15.0000 mg/h | INHALATION_SOLUTION | Freq: Once | RESPIRATORY_TRACT | Status: AC
  Administered 2016-05-08: 15 mg/h via RESPIRATORY_TRACT
  Filled 2016-05-08: qty 20

## 2016-05-08 MED ORDER — LEVOFLOXACIN IN D5W 500 MG/100ML IV SOLN: 500.0000 mg | INTRAVENOUS | Status: DC

## 2016-05-08 MED ORDER — HEPARIN SODIUM (PORCINE) 5000 UNIT/ML IJ SOLN
5000.0000 [IU] | Freq: Three times a day (TID) | INTRAMUSCULAR | Status: DC
  Administered 2016-05-08 – 2016-05-10 (×7): 5000 [IU] via SUBCUTANEOUS
  Filled 2016-05-08 (×7): qty 1

## 2016-05-08 MED ORDER — INSULIN ASPART 100 UNIT/ML ~~LOC~~ SOLN
0.0000 [IU] | Freq: Three times a day (TID) | SUBCUTANEOUS | Status: DC
  Administered 2016-05-08: 5 [IU] via SUBCUTANEOUS
  Administered 2016-05-08: 8 [IU] via SUBCUTANEOUS
  Administered 2016-05-08: 11 [IU] via SUBCUTANEOUS
  Administered 2016-05-09 – 2016-05-10 (×4): 5 [IU] via SUBCUTANEOUS
  Administered 2016-05-10: 8 [IU] via SUBCUTANEOUS
  Administered 2016-05-10: 5 [IU] via SUBCUTANEOUS

## 2016-05-08 MED ORDER — ACETAMINOPHEN 325 MG PO TABS: 650.0000 mg | ORAL_TABLET | ORAL | Status: DC | PRN

## 2016-05-08 MED ORDER — METHYLPREDNISOLONE SODIUM SUCC 125 MG IJ SOLR
125.0000 mg | Freq: Once | INTRAMUSCULAR | Status: AC
  Administered 2016-05-08: 125 mg via INTRAVENOUS
  Filled 2016-05-08: qty 2

## 2016-05-08 MED ORDER — ALLOPURINOL 100 MG PO TABS
100.0000 mg | ORAL_TABLET | Freq: Two times a day (BID) | ORAL | Status: DC
  Administered 2016-05-08 – 2016-05-10 (×5): 100 mg via ORAL
  Filled 2016-05-08 (×5): qty 1

## 2016-05-08 MED ORDER — MECLIZINE HCL 12.5 MG PO TABS: 25.0000 mg | ORAL_TABLET | Freq: Three times a day (TID) | ORAL | Status: DC | PRN

## 2016-05-08 MED ORDER — HYDRALAZINE HCL 20 MG/ML IJ SOLN
10.0000 mg | INTRAMUSCULAR | Status: DC | PRN
  Administered 2016-05-08: 10 mg via INTRAVENOUS
  Filled 2016-05-08: qty 1

## 2016-05-08 MED ORDER — LINACLOTIDE 72 MCG PO CAPS
72.0000 ug | ORAL_CAPSULE | Freq: Every day | ORAL | Status: DC
  Filled 2016-05-08 (×3): qty 1

## 2016-05-08 MED ORDER — IPRATROPIUM BROMIDE 0.02 % IN SOLN
0.5000 mg | Freq: Once | RESPIRATORY_TRACT | Status: AC
  Administered 2016-05-08: 0.5 mg via RESPIRATORY_TRACT
  Filled 2016-05-08: qty 2.5

## 2016-05-08 MED ORDER — ONDANSETRON HCL 4 MG/2ML IJ SOLN: 4.0000 mg | Freq: Four times a day (QID) | INTRAMUSCULAR | Status: DC | PRN

## 2016-05-08 MED ORDER — ALBUTEROL SULFATE (2.5 MG/3ML) 0.083% IN NEBU: 5.0000 mg | INHALATION_SOLUTION | Freq: Once | RESPIRATORY_TRACT | Status: DC

## 2016-05-08 MED ORDER — INSULIN ASPART 100 UNIT/ML ~~LOC~~ SOLN
0.0000 [IU] | Freq: Every day | SUBCUTANEOUS | Status: DC
  Administered 2016-05-08: 2 [IU] via SUBCUTANEOUS
  Administered 2016-05-09: 4 [IU] via SUBCUTANEOUS

## 2016-05-08 MED ORDER — SODIUM CHLORIDE 0.9% FLUSH
3.0000 mL | Freq: Two times a day (BID) | INTRAVENOUS | Status: DC
  Administered 2016-05-08 – 2016-05-10 (×5): 3 mL via INTRAVENOUS

## 2016-05-08 MED ORDER — IPRATROPIUM-ALBUTEROL 0.5-2.5 (3) MG/3ML IN SOLN
3.0000 mL | Freq: Four times a day (QID) | RESPIRATORY_TRACT | Status: DC | PRN
  Administered 2016-05-08: 3 mL via RESPIRATORY_TRACT

## 2016-05-08 MED ORDER — LEVOFLOXACIN IN D5W 500 MG/100ML IV SOLN
500.0000 mg | Freq: Once | INTRAVENOUS | Status: AC
Start: 2016-05-08 — End: 2016-05-08
  Administered 2016-05-08: 500 mg via INTRAVENOUS
  Filled 2016-05-08: qty 100

## 2016-05-08 MED ORDER — FUROSEMIDE 10 MG/ML IJ SOLN
80.0000 mg | Freq: Once | INTRAMUSCULAR | Status: AC
  Administered 2016-05-08: 80 mg via INTRAVENOUS
  Filled 2016-05-08: qty 8

## 2016-05-08 MED ORDER — METOPROLOL SUCCINATE ER 50 MG PO TB24
50.0000 mg | ORAL_TABLET | Freq: Every day | ORAL | Status: DC
  Administered 2016-05-08 – 2016-05-10 (×3): 50 mg via ORAL
  Filled 2016-05-08 (×3): qty 1

## 2016-05-08 MED ORDER — SODIUM CHLORIDE 0.9 % IV SOLN: 250.0000 mL | INTRAVENOUS | Status: DC | PRN

## 2016-05-08 MED ORDER — IPRATROPIUM BROMIDE 0.02 % IN SOLN: 0.5000 mg | Freq: Once | RESPIRATORY_TRACT | Status: DC

## 2016-05-08 MED ORDER — LOPERAMIDE HCL 2 MG PO CAPS: 2.0000 mg | ORAL_CAPSULE | ORAL | Status: DC | PRN

## 2016-05-08 MED ORDER — INSULIN GLARGINE 100 UNIT/ML ~~LOC~~ SOLN
25.0000 [IU] | Freq: Every day | SUBCUTANEOUS | Status: DC
  Administered 2016-05-08 – 2016-05-09 (×2): 25 [IU] via SUBCUTANEOUS
  Filled 2016-05-08 (×5): qty 0.25

## 2016-05-08 MED ORDER — FUROSEMIDE 10 MG/ML IJ SOLN
40.0000 mg | Freq: Every day | INTRAMUSCULAR | Status: DC
  Administered 2016-05-08: 40 mg via INTRAVENOUS
  Filled 2016-05-08: qty 4

## 2016-05-08 MED ORDER — ATORVASTATIN CALCIUM 40 MG PO TABS
40.0000 mg | ORAL_TABLET | Freq: Every day | ORAL | Status: DC
  Administered 2016-05-08 – 2016-05-09 (×2): 40 mg via ORAL
  Filled 2016-05-08 (×2): qty 1

## 2016-05-08 MED ORDER — GLUCERNA SHAKE PO LIQD
237.0000 mL | Freq: Three times a day (TID) | ORAL | Status: DC
  Administered 2016-05-08 – 2016-05-10 (×4): 237 mL via ORAL

## 2016-05-08 MED ORDER — PRO-STAT SUGAR FREE PO LIQD
30.0000 mL | Freq: Two times a day (BID) | ORAL | Status: DC
  Administered 2016-05-08 – 2016-05-10 (×4): 30 mL via ORAL
  Filled 2016-05-08 (×4): qty 30

## 2016-05-08 MED ORDER — AMITRIPTYLINE HCL 25 MG PO TABS
100.0000 mg | ORAL_TABLET | Freq: Every day | ORAL | Status: DC
  Administered 2016-05-08 – 2016-05-09 (×2): 100 mg via ORAL
  Filled 2016-05-08 (×2): qty 4
  Filled 2016-05-08 (×2): qty 1
  Filled 2016-05-08 (×3): qty 4

## 2016-05-08 MED ORDER — METHYLPREDNISOLONE SODIUM SUCC 125 MG IJ SOLR
60.0000 mg | Freq: Four times a day (QID) | INTRAMUSCULAR | Status: DC
  Administered 2016-05-08 – 2016-05-09 (×7): 60 mg via INTRAVENOUS
  Filled 2016-05-08 (×7): qty 2

## 2016-05-08 NOTE — H&P (Signed)
History and Physical    [name] GQQ:[id number] DOB: 06/10/1946 DOA: 05/07/2016  PCP: [name], MD   Patient coming from: Home  Chief Complaint: Cough, dyspnea  HPI: [name] is a 70 y.o. female with medical history significant for COPD, chronic diastolic CHF, GERD, obstructive sleep apnea, insulin-dependent diabetes mellitus, and chronic iron deficiency anemia who presents the emergency department with cough and progressive dyspnea. Patient reports the insidious development of a cough a couple weeks ago and has now developed dyspnea which has been progressive over the last few days. She denies any recent fevers, chills, sick contacts, or long distance travel. Cough is only occasionally productive of thick white sputum. There is no associated chest pain or palpitations. Patient reports a increase in her chronic bilateral lower extremity edema and states that her orthopnea has worsened in recent days. She has been using her home nebulizer for the symptoms, and while it seemed to help initially, it was not providing any significant relief tonight, prompting her presentation here to the ED. Patient denies any recent change in her medications or dietary indiscretions.  ED Course: Upon arrival to the ED, patient is found to be afebrile, saturating adequately on room air, tachypneic in the 30s, tachycardic in the 110s, and with blood pressure 200/115. EKG featured a sinus tachycardia with rate 110 and nonspecific ST abnormality in the lateral leads which isn't significantly changed from prior. Chest x-ray features mild bronchitic changes without a focal consolidation, as well as stable cardiomegaly and large hiatal hernia. Chemistry panel features a serum creatinine 1.27, up from an apparent baseline of 1.1. CBC is notable for a leukocytosis to 16,100 and a stable normocytic anemia with hemoglobin of 11.1. Troponin is undetectable and BNP is within the normal limits at 79. Patient was  treated with duo nebs in the emergency department, given an 80 mg IV push of Lasix, 125 mg IV Solu-Medrol, and had 1 inch of nitroglycerin paste placed in the setting of suspected acute CHF. Patient had some mild improvement in her symptoms with these measures, but remains tachypneic and dyspneic while at rest. She'll be admitted to the telemetry unit for ongoing evaluation and management of progressive dyspnea suspected to be multifactorial with primary contribution from acute exacerbation and COPD, as well as likely acute on chronic diastolic CHF.  Review of Systems:  All other systems reviewed and apart from HPI, are negative.  Past Medical History:  Diagnosis Date  . Chronic back pain   . COPD (chronic obstructive pulmonary disease) ([place])   . Degenerative disc disease   . Essential hypertension, benign   . GERD (gastroesophageal reflux disease)   . Gout   . Hiatal hernia 07/27/2013  . History of diverticulitis of colon   . History of hiatal hernia   . Iron deficiency anemia   . Iron deficiency anemia due to chronic blood loss 12/25/2009   APR 2015: NL FERRITIN/Hb     . Irritable bowel syndrome   . Lumbar radiculopathy   . Mixed hyperlipidemia   . Neuropathy ([place])   . Ovarian cancer ([place]) 04/02/2016  . Sleep apnea   . Type 2 diabetes mellitus ([place])   . Vitamin B deficiency 12/25/2009   Qualifier: Diagnosis of  By: Sofie Rower    . Vitamin B12 deficiency     Past Surgical History:  Procedure Laterality Date  . ABDOMINAL HYSTERECTOMY    . BACK SURGERY    . Benign breast cysts    . CHOLECYSTECTOMY    .  COLONOSCOPY  10/01/2006   SLF:Pan colonic diverticulosis and moderate internal hemorrhoids/ Otherwise no polyps, masses, inflammatory changes or AVMs/  . COLONOSCOPY  2011   SLF: pancolonic diverticulosis, large internal hemorrhoids  . COLONOSCOPY N/A 01/26/2016   Procedure: COLONOSCOPY;  Surgeon: [name], MD;  Location: [facility];  Service: Endoscopy;   Laterality: N/A;  830   . ESOPHAGOGASTRODUODENOSCOPY  11/19/2006   SLF: Large hiatal hernia without evidence of Cameron ulcers/. Distal esophageal stricture, which allowed the gastroscope to pass without resistance.  A 16 mm Savary later passed with mild resistance/ Normal stomach.sb bx negative  . ESOPHAGOGASTRODUODENOSCOPY  10/01/2006   UXL:KGMWN hiatal hernia.  Distal esophagus without evidence of   erythema, ulceration or Barrett's esophagus  . ESOPHAGOGASTRODUODENOSCOPY  2011   SLF: large hh, distal esophageal web narrowing to 60mm s/p dilation to 32mm  . ESOPHAGOGASTRODUODENOSCOPY N/A 08/06/2013   SLF: 1. Stricture at the gastroesophageal junction 2. large hiatal hernia. 3. Mild erosive gastritis.  Marland Kitchen GIVENS CAPSULE STUDY N/A 08/06/2013   INCOMPLETE-SMALL BOWLE ULCERS  . KNEE SURGERY Right   . PARTIAL HYSTERECTOMY  1978  . small bowel capsule  2008   negative  . TONSILLECTOMY AND ADENOIDECTOMY    . Two back surgeries/fusion    . UMBILICAL HERNIA REPAIR  2010     reports that she quit smoking about 54 years ago. Her smoking use included Cigarettes. She has a 1.00 pack-year smoking history. She has never used smokeless tobacco. She reports that she does not drink alcohol or use drugs.  No Known Allergies  Family History  Problem Relation Age of Onset  . Colon cancer Brother     diagnosed age 52. Living.   Marland Kitchen Ulcers Sister   . Diabetes Sister   . Heart attack Sister   . Kidney failure Sister   . Stroke Sister   . Ulcers Mother   . Diabetes Mother   . Heart attack Mother   . Stroke Mother   . Asthma Mother   . Heart attack Brother   . Asthma Sister   . Diabetes Brother   . Stroke Maternal Grandmother   . Heart attack Maternal Grandmother   . Heart attack Other      Prior to Admission medications   Medication Sig Start Date End Date Taking? Authorizing Provider  allopurinol (ZYLOPRIM) 100 MG tablet Take 1 tablet by mouth 2 (two) times daily. 10/09/12   Historical Provider,  MD  amitriptyline (ELAVIL) 100 MG tablet Take 100 mg by mouth at bedtime.      Historical Provider, MD  aspirin EC 81 MG tablet Take 81 mg by mouth daily.    Historical Provider, MD  atorvastatin (LIPITOR) 40 MG tablet Take 1 tablet by mouth at bedtime.  10/09/12   Historical Provider, MD  B-D ULTRA-FINE 33 LANCETS MISC USE TO CHECK BG 3 TIMES A DAY 01/19/16   Historical Provider, MD  BIOTIN PO Take 1 capsule by mouth daily.     Historical Provider, MD  cyanocobalamin (,VITAMIN B-12,) 1000 MCG/ML injection Inject 1 mL (1,000 mcg total) into the muscle every 30 (thirty) days. 04/02/16   Baird Cancer, PA-C  cyanocobalamin (,VITAMIN B-12,) 1000 MCG/ML injection Inject 1 mL (1,000 mcg total) into the muscle every 30 (thirty) days. 04/02/16   Baird Cancer, PA-C  cyanocobalamin (,VITAMIN B-12,) 1000 MCG/ML injection Inject 1 mL (1,000 mcg total) into the muscle every 30 (thirty) days. 04/03/16   Baird Cancer, PA-C  esomeprazole (  NEXIUM) 40 MG capsule Take 40 mg by mouth daily at 12 noon.    Historical Provider, MD  famotidine (PEPCID) 40 MG tablet TAKE 1 TABLET AT BEDTIME AS NEEDED TO CONTROL REFLUX Patient taking differently: Take 40 mg by mouth at bedtime as needed for heartburn or indigestion.  06/23/15   Praveen Mannam, MD  fluticasone furoate-vilanterol (BREO ELLIPTA) 100-25 MCG/INH AEPB Inhale 1 puff into the lungs daily.    Historical Provider, MD  furosemide (LASIX) 40 MG tablet Take 40 mg by mouth daily.     Historical Provider, MD  insulin lispro (HUMALOG) 100 UNIT/ML KiwkPen Use as directed Stark by sliding scale with meals. MDD=50 units 03/12/16   Historical Provider, MD  LANTUS SOLOSTAR 100 UNIT/ML SOPN Inject 26 Units into the skin at bedtime.  10/06/12   Historical Provider, MD  linaclotide Rolan Lipa) 72 MCG capsule Take 1 capsule (72 mcg total) by mouth daily before breakfast. 04/04/16   Carlis Stable, NP  loperamide (IMODIUM) 2 MG capsule Take 2 mg by mouth as needed for diarrhea or loose  stools.    Historical Provider, MD  meclizine (ANTIVERT) 25 MG tablet Take 1 tablet (25 mg total) by mouth 3 (three) times daily as needed for dizziness. 06/28/15   Nishant Dhungel, MD  metFORMIN (GLUCOPHAGE) 1000 MG tablet Take 1,000 mg by mouth 2 (two) times daily with a meal.    Historical Provider, MD  metoprolol succinate (TOPROL-XL) 25 MG 24 hr tablet Take 50 mg by mouth daily.     Historical Provider, MD  naproxen sodium (ANAPROX) 220 MG tablet Take 440 mg by mouth 2 (two) times daily with a meal.    Historical Provider, MD  ondansetron (ZOFRAN) 4 MG tablet Take 1 tablet (4 mg total) by mouth every 8 (eight) hours as needed for nausea or vomiting. 03/29/15   [name], MD  tiotropium (SPIRIVA) 18 MCG inhalation capsule Place 18 mcg into inhaler and inhale daily.    Historical Provider, MD    Physical Exam: Vitals:   05/08/16 0200 05/08/16 0215 05/08/16 0222 05/08/16 0230  BP: 151/55     Pulse: (!) 128 (!) 131  (!) 125  Resp: 24 26  20   Temp:      TempSrc:      SpO2: 93% 90% 94% 98%  Weight:      Height:          Constitutional: Respiratory distress with tachypnea and accessory muscle recruitment, no pallor  Eyes: PERTLA, lids and conjunctivae normal ENMT: Mucous membranes are moist. Posterior pharynx clear of any exudate or lesions.   Neck: normal, supple, no masses, no thyromegaly Respiratory: Increased WOB, rhonchi at left apex, scattered expiratory wheezes. No supplemental oxygen being delivered.  Cardiovascular: Rate ~110 and regular. 2+ BLE edema. No significant JVD while upright. Abdomen: No distension, no tenderness, no masses palpated. Bowel sounds normal.  Musculoskeletal: no clubbing / cyanosis. No joint deformity upper and lower extremities. Normal muscle tone.  Skin: Hyperpigmentation of distal BLE's in gaiter distribution. Skin otherwise warm, dry, well-perfused. Neurologic: CN 2-12 grossly intact. Sensation intact, DTR normal. Strength 5/5 in all 4 limbs.    Psychiatric: Normal judgment and insight. Alert and oriented x 3. Normal mood and affect.     Labs on Admission: I have personally reviewed following labs and imaging studies  CBC:  Recent Labs Lab 05/08/16 0030  WBC 16.1*  NEUTROABS 10.6*  HGB 11.1*  HCT 34.4*  MCV 85.8  PLT 311  Basic Metabolic Panel:  Recent Labs Lab 05/08/16 0030  NA 138  K 4.0  CL 105  CO2 27  GLUCOSE 120*  BUN 30*  CREATININE 1.27*  CALCIUM 7.9*   GFR: Estimated Creatinine Clearance: 43.4 mL/min (by C-G formula based on SCr of 1.27 mg/dL (H)). Liver Function Tests:  Recent Labs Lab 05/08/16 0030  AST 15  ALT 16  ALKPHOS 96  BILITOT 0.5  PROT 7.3  ALBUMIN 3.6   No results for input(s): LIPASE, AMYLASE in the last 168 hours. No results for input(s): AMMONIA in the last 168 hours. Coagulation Profile: No results for input(s): INR, PROTIME in the last 168 hours. Cardiac Enzymes:  Recent Labs Lab 05/08/16 0030  TROPONINI <0.03   BNP (last 3 results) No results for input(s): PROBNP in the last 8760 hours. HbA1C: No results for input(s): HGBA1C in the last 72 hours. CBG: No results for input(s): GLUCAP in the last 168 hours. Lipid Profile: No results for input(s): CHOL, HDL, LDLCALC, TRIG, CHOLHDL, LDLDIRECT in the last 72 hours. Thyroid Function Tests: No results for input(s): TSH, T4TOTAL, FREET4, T3FREE, THYROIDAB in the last 72 hours. Anemia Panel: No results for input(s): VITAMINB12, FOLATE, FERRITIN, TIBC, IRON, RETICCTPCT in the last 72 hours. Urine analysis:    Component Value Date/Time   COLORURINE YELLOW 06/27/2015 2151   APPEARANCEUR CLEAR 06/27/2015 2151   LABSPEC 1.015 06/27/2015 2151   PHURINE 6.5 06/27/2015 2151   GLUCOSEU NEGATIVE 06/27/2015 2151   HGBUR NEGATIVE 06/27/2015 2151   BILIRUBINUR NEGATIVE 06/27/2015 2151   KETONESUR NEGATIVE 06/27/2015 2151   PROTEINUR NEGATIVE 06/27/2015 2151   UROBILINOGEN 0.2 01/29/2007 1500   NITRITE NEGATIVE  06/27/2015 2151   LEUKOCYTESUR NEGATIVE 06/27/2015 2151   Sepsis Labs: @LABRCNTIP (procalcitonin:4,lacticidven:4) )No results found for this or any previous visit (from the past 240 hour(s)).   Radiological Exams on Admission: Dg Chest Port 1 View  Result Date: 05/08/2016 CLINICAL DATA:  Shortness of breath for 2 weeks, cough and wheezing. EXAM: PORTABLE CHEST 1 VIEW COMPARISON:  Chest radiograph June 20, 2015 FINDINGS: Cardiac silhouette is moderately enlarged but can't unchanged. Mediastinal silhouette is nonsuspicious. Large air-filled hiatal hernia. Mild bronchitic changes without pleural effusion or focal consolidation. Similar strandy densities RIGHT lung base. No pneumothorax. Single lumen RIGHT chest Port-A-Cath with distal tip projecting in proximal superior vena cava. Large body habitus. Osseous structures are nonsuspicious. IMPRESSION: Mild bronchitic changes without focal consolidation. RIGHT lung base atelectasis versus scarring. Stable cardiomegaly. Large hiatal hernia. Electronically Signed   By: Elon Alas M.D.   On: 05/08/2016 02:14    EKG: Independently reviewed. Sinus tachycardia (rate 110), non-specific ST-abnormality in laterals leads that is not significantly changed from prior.   Assessment/Plan   1. COPD with acute exacerbation - Uncertain precipitant, worsening over several days despite home treatments  - No infiltrate observed on CXR  - Nebs and 125 mg IV Solu-Medrol given in ED with only slight improvement  - Continue scheduled Breo, DuoNebs, systemic steroid with Solu-Medrol 60 mg IV q6h  - Obtain sputum-culture and gram-stain; start empiric Levaquin, renally-dosed - Monitor with continuous pulse oximetry and titrate FiO2 to maintain sat >92%    2. Acute on chronic diastolic CHF  - Pt reports increased BLE edema and worsening orthopnea  - BNP wnl, wt up 5 lbs from office visit on 9/21 - No edema on CXR, no crackles or gallop on auscultation  - TTE  (12/09/12) with EF 55-60%, mild LAE, mild TR  - Managed at  home with Lasix and metoprolol; no on ACE/ARB, possibly d/t renal insufficiency  - SLIV, fluid-restrict diet, follow daily wts and strict I/O's  - Continue diuresis with Lasix 40 mg IV qD   3. Insulin-dependent DM  - A1c was 6.7% in November 2016  - Managed at home with metformin, Lantus 26 units qHS, and Humalog TID per sliding-scale - Check CBG with meals and qHS  - Start with Lanuts 25 units qHS with a moderate-intensity sliding-scale correctional and adjust prn    4. CKD stage II  - SCr 1.27 on admission, a little up from apparent baseline of 1.0 or 1.1  - Follow daily BMP while diuresing   5. Normocytic anemia  - Hgb 11.1 on admission and stable with no s/s of active bleeding  - Attributed to [place]; she follows with hematology for iron-infusions and receives parenteral B-12 monthly    6. GERD - Stable, continue daily PPI and prn Pepcid    7. Hypertension  - Elevated to 200/115 range on presentation, likely d/t acute on chronic CHF  - Has normalized following administration of IV Lasix and nitropaste in ED  - Continue Toprol and Lasix   8. OSA  - Continue CPAP qHS     DVT prophylaxis: sq heparin Code Status: Full  Family Communication: Husband updated at bedside  Disposition Plan: Admit to telemetry Consults called: None Admission status: Inpatient    Vianne Bulls, MD [facility] Pager [phone]  If 7PM-7AM, please contact night-coverage [email or web address] Password [email or web address]  05/08/2016, 2:43 AM

## 2016-05-08 NOTE — Progress Notes (Signed)
Initial Nutrition Assessment  DOCUMENTATION CODES:   Morbid obesity  INTERVENTION:  Heart Healthy/ CHO modified diet  Provided education Low Sodium diet  Add Glucerna Shake po TID, each supplement provides 220 kcal and 10 grams of protein   ProStat 30 ml BID (each 30 ml provides 100 kcal, 15 gr protein)   Daily weights  NUTRITION DIAGNOSIS:   Inadequate oral intake related to acute illness as evidenced by meal completion < 50%.  GOAL:   Patient will meet greater than or equal to 90% of their needs   MONITOR:   PO intake, Weight trends, Labs, I & O's  REASON FOR ASSESSMENT:   Consult Assessment of nutrition requirement/status  ASSESSMENT:  Patient presents with a cough and dyspnea. She has a hx of COPD, chronic diastolic CHF, GERD, IDDM.  Her weight is up 2.6% in the past 2 weeks likely associated with her increased bilateral lower extremity edema.  She follows a regular diet at home and doesn't weigh herself daily. Patient says her main meal daily is in the evening and usually meat and vegetables. She drinks sweet tea, lemonade, or soft drinks. She ambulates with a walker and says it's difficult for her to get any exercise.  Nutrition-Focused physical exam completed. Findings are no fat depletion,  No muscle depletion, and moderate BLE edema.     Recent Labs Lab 05/08/16 0030  NA 138  K 4.0  CL 105  CO2 27  BUN 30*  CREATININE 1.27*  CALCIUM 7.9*  GLUCOSE 120*    Labs: BUN 30, Cr. 1.27, glucose 120. WBC's 16.1, H/H-11.1/ 34.4. Her A1C-6.7% 06/28/2015.  Meds: lasix, insulin , protonix, solu-medrol  Diet Order:  Diet heart healthy/carb modified Room service appropriate? Yes; Fluid consistency: Thin; Fluid restriction: 1500 mL Fluid  Skin:  Reviewed, no issues  Last BM:  10/3   Height:   Ht Readings from Last 1 Encounters:  05/08/16 4\' 11"  (1.499 m)    Weight:   Wt Readings from Last 1 Encounters:  05/08/16 235 lb 12.8 oz (107 kg)    Ideal  Body Weight:  43 kg  BMI:  Body mass index is 47.63 kg/m.  Estimated Nutritional Needs:   Kcal:  1300-1500  Protein:  99-108 gr  Fluid:  1.5 liters daily  EDUCATION NEEDS:   Education needs addressed - regarding following low sodium diet, label reading, recommended daily sodium/fluid intake and weighing herself daily.    Colman Cater MS,RD,CSG,LDN Office: [phone] Pager: [phone]

## 2016-05-08 NOTE — Evaluation (Signed)
Physical Therapy Evaluation Patient Details Name: [name] MRN: [id number] DOB: 02/08/1946 Today's Date: 05/08/2016   History of Present Illness  [name] is a 70 y.o. female with medical history significant for COPD, chronic diastolic CHF, GERD, obstructive sleep apnea, insulin-dependent diabetes mellitus, and chronic iron deficiency anemia who presents the emergency department with cough and progressive dyspnea. Patient reports the insidious development of a cough a couple weeks ago and has now developed dyspnea which has been progressive over the last few days. She denies any recent fevers, chills, sick contacts, or long distance travel. Cough is only occasionally productive of thick white sputum. There is no associated chest pain or palpitations. Patient reports a increase in her chronic bilateral lower extremity edema and states that her orthopnea has worsened in recent days. She has been using her home nebulizer for the symptoms, and while it seemed to help initially, it was not providing any significant relief tonight, prompting her presentation here to the ED.  Clinical Impression  Pt alert and oriented, sitting at EOB reading upon PT/OT arrival. She was willing to participate in evaluation. She lives at home with her husband who will occasionally provide assistance with ADLs. Currently she demonstrates BLE weakness via MMT, however she is modified independent with bed mobility as well as all other transfers and ambulation. She does demonstrate balance deficits with standing forward reach and reports using the walls for assistance occasionally at home. She does not require PT for discharge from [facility], however I discussed with her the benefits of following up with OPPT to address pre-existing weakness/balance issues. At this time, she is safe to return home without further PT services, assuming no decline in pt's medical status.     Follow Up Recommendations Outpatient PT;Other  (comment) (Discussed following up with OPPT to improve weakness/balance deficits at baseline)    Equipment Recommendations  None recommended by PT    Recommendations for Other Services       Precautions / Restrictions Precautions Precautions: Other (comment) Precaution Comments: 2L O2 Restrictions Weight Bearing Restrictions: No      Mobility  Bed Mobility Overal bed mobility: Modified Independent                Transfers Overall transfer level: Modified independent Equipment used: None                Ambulation/Gait Ambulation/Gait assistance: Independent Ambulation Distance (Feet): 40 Feet Assistive device: None Gait Pattern/deviations: WFL(Within Functional Limits)        Stairs            Wheelchair Mobility    Modified Rankin (Stroke Patients Only)       Balance Overall balance assessment: No apparent balance deficits (not formally assessed) (Standing reach ~5in )                                           Pertinent Vitals/Pain Pain Assessment: No/denies pain    Home Living Family/patient expects to be discharged to:: Private residence Living Arrangements: Spouse/significant other Available Help at Discharge: Family;Available 24 hours/day Type of Home: House Home Access: Level entry     Home Layout: One level Home Equipment: Walker - 2 wheels;Shower seat;Cane - single point;Bedside commode Additional Comments: Per pt, she wears 2L O2 at night and prn during the day    Prior Function Level of Independence: Needs assistance  Gait / Transfers Assistance Needed: Pt uses walker occasionally, furniture walks around the house primarily  ADL's / Homemaking Assistance Needed: Pt husband provides assistance with dressing and bathing as needed, son assists with meal prep and household tasks occasionally         Hand Dominance   Dominant Hand: Right    Extremity/Trunk Assessment   Upper Extremity Assessment:  Defer to OT evaluation           Lower Extremity Assessment: Generalized weakness;Overall WFL for tasks assessed (LE MMT grossly 4/5 throughout)         Communication   Communication: No difficulties  Cognition Arousal/Alertness: Awake/alert Behavior During Therapy: WFL for tasks assessed/performed Overall Cognitive Status: Within Functional Limits for tasks assessed                      General Comments General comments (skin integrity, edema, etc.): BLE edema, redness noted    Exercises     Assessment/Plan    PT Assessment Patent does not need any further PT services  PT Problem List            PT Treatment Interventions Therapeutic activities;Balance training;Therapeutic exercise;Patient/family education;Gait training    PT Goals (Current goals can be found in the Care Plan section)  Acute Rehab PT Goals Patient Stated Goal: improve strength to go home PT Goal Formulation: With patient Time For Goal Achievement: 05/31/16 Potential to Achieve Goals: Good    Frequency     Barriers to discharge   none     Co-evaluation PT/OT/SLP Co-Evaluation/Treatment: Yes Reason for Co-Treatment: Complexity of the patient's impairments (multi-system involvement) PT goals addressed during session: Mobility/safety with mobility;Balance OT goals addressed during session: ADL's and self-care       End of Session Equipment Utilized During Treatment: Gait belt Activity Tolerance: Patient limited by fatigue Patient left: in bed;with call bell/phone within reach      Functional Assessment Tool Used: clinical judgement based on assessment of strenght, mobility and balance Functional Limitation: Mobility: Walking and moving around Mobility: Walking and Moving Around Current Status (R8412): At least 20 percent but less than 40 percent impaired, limited or restricted Mobility: Walking and Moving Around Goal Status [phone]): At least 20 percent but less than 40 percent  impaired, limited or restricted    Time: 0852-0906 PT Time Calculation (min) (ACUTE ONLY): 14 min   Charges:   PT Evaluation $PT Eval Low Complexity: 1 Procedure PT Treatments $Therapeutic Activity: 8-22 mins   PT G Codes:   PT G-Codes **NOT FOR INPATIENT CLASS** Functional Assessment Tool Used: clinical judgement based on assessment of strenght, mobility and balance Functional Limitation: Mobility: Walking and moving around Mobility: Walking and Moving Around Current Status (N8871): At least 20 percent but less than 40 percent impaired, limited or restricted Mobility: Walking and Moving Around Goal Status [phone]): At least 20 percent but less than 40 percent impaired, limited or restricted   10:34 AM,05/08/16 Elly Modena PT, [place] [phone]

## 2016-05-08 NOTE — Clinical Social Work Note (Signed)
CSW received consult for COPD gold protocol. Pt does not meet criteria as this is first admission in 6 months. Will sign off, but can be reconsulted if needed.  Kingstyn Deruiter, LCSW [phone] 

## 2016-05-08 NOTE — Evaluation (Signed)
Occupational Therapy Evaluation Patient Details Name: [name] MRN: [id number] DOB: Feb 12, 1946 Today's Date: 05/08/2016    History of Present Illness [name] is a 70 y.o. female with medical history significant for COPD, chronic diastolic CHF, GERD, obstructive sleep apnea, insulin-dependent diabetes mellitus, and chronic iron deficiency anemia who presents the emergency department with cough and progressive dyspnea. Patient reports the insidious development of a cough a couple weeks ago and has now developed dyspnea which has been progressive over the last few days. She denies any recent fevers, chills, sick contacts, or long distance travel. Cough is only occasionally productive of thick white sputum. There is no associated chest pain or palpitations. Patient reports a increase in her chronic bilateral lower extremity edema and states that her orthopnea has worsened in recent days. She has been using her home nebulizer for the symptoms, and while it seemed to help initially, it was not providing any significant relief tonight, prompting her presentation here to the ED.   Clinical Impression   Pt awake, alert, sitting at EOB this am, oriented x4. Pt agreeable to OT/PT evaluation. Pt demonstrates mod independence for ADL tasks, supervision for functional mobility. Pt with increased SOB during ADLs, SpO2 remaining at 91-96% throughout session. While standing at sink pt with coughing episode increasing HR to 121, SpO2 remaining stable at 95% on 2L O2. Pt is at baseline with functional tasks at this time, husband is available to assist with ADLs as needed at home. Pt educated on PLB during evaluation. Pt has all necessary DME, no further OT services required at this time.     Follow Up Recommendations  No OT follow up;Supervision/Assistance - 24 hour    Equipment Recommendations  None recommended by OT       Precautions / Restrictions Precautions Precautions: Other  (comment) Precaution Comments: 2L O2 Restrictions Weight Bearing Restrictions: No      Mobility Bed Mobility Overal bed mobility: Modified Independent                Transfers Overall transfer level: Modified independent Equipment used: None                       ADL Overall ADL's : Needs assistance/impaired     Grooming: Wash/dry hands;Modified independent;Standing                   Toilet Transfer: Modified Independent;Regular Toilet;Grab bars   Toileting- Clothing Manipulation and Hygiene: Modified independent;Sit to/from stand       Functional mobility during ADLs: Supervision/safety       Vision Vision Assessment?: No apparent visual deficits          Pertinent Vitals/Pain Pain Assessment: No/denies pain     Hand Dominance Right   Extremity/Trunk Assessment Upper Extremity Assessment Upper Extremity Assessment: Overall WFL for tasks assessed   Lower Extremity Assessment Lower Extremity Assessment: Defer to PT evaluation       Communication Communication Communication: No difficulties   Cognition Arousal/Alertness: Awake/alert Behavior During Therapy: WFL for tasks assessed/performed Overall Cognitive Status: Within Functional Limits for tasks assessed                                Home Living Family/patient expects to be discharged to:: Private residence Living Arrangements: Spouse/significant other Available Help at Discharge: Family;Available 24 hours/day Type of Home: House Home Access: Level entry  Home Layout: One level     Bathroom Shower/Tub: Occupational psychologist: Standard     Home Equipment: Environmental consultant - 2 wheels;Shower seat;Cane - single point;Bedside commode   Additional Comments: Pt wears 2L O2 at night and prn during the day      Prior Functioning/Environment Level of Independence: Needs assistance  Gait / Transfers Assistance Needed: Pt uses walker occasionally,  furniture walks around the house primarily ADL's / [place] Needed: Pt husband provides assistance with dressing and bathing as needed, son assists with meal prep and household tasks occasionally             OT Problem List: Cardiopulmonary status limiting activity                    Co-evaluation PT/OT/SLP Co-Evaluation/Treatment: Yes Reason for Co-Treatment: Complexity of the patient's impairments (multi-system involvement) PT goals addressed during session: Mobility/safety with mobility;Balance OT goals addressed during session: ADL's and self-care      End of Session    Activity Tolerance: Other (comment) (limited by SOB) Patient left: in bed;with call bell/phone within reach   Time: 0850-0905 OT Time Calculation (min): 15 min Charges:  OT General Charges $OT Visit: 1 Procedure OT Evaluation $OT Eval Low Complexity: 1 Procedure Guadelupe Sabin, OTR/L  [phone] 05/08/2016, 9:34 AM

## 2016-05-08 NOTE — Progress Notes (Signed)
Patient admitted to the hospital earlier this morning by Dr. Myna Hidalgo  Patient seen and examined. She continues to feel short of breath, although does feel better since admission. Lungs are mostly clear on exam with occasional wheeze. She does have 1-2+ pitting edema lower extremities.  She's been admitted with respiratory failure. She reports using oxygen at night and occasionally during the day as needed. She is being treated for acute on chronic congestive heart failure as well as COPD exacerbation. She's had good diuresis with intravenous Lasix. Continue IV Lasix for now. Continue nebulizer treatments and steroids.  [name]

## 2016-05-08 NOTE — Progress Notes (Signed)
Patient brought her CPAP from home. Our CPAP machine taken out of room. RT will continue to monitor.

## 2016-05-09 LAB — BASIC METABOLIC PANEL
ANION GAP: 10 (ref 5–15)
BUN: 45 mg/dL — ABNORMAL HIGH (ref 6–20)
CALCIUM: 8 mg/dL — AB (ref 8.9–10.3)
CO2: 30 mmol/L (ref 22–32)
Chloride: 96 mmol/L — ABNORMAL LOW (ref 101–111)
Creatinine, Ser: 1.47 mg/dL — ABNORMAL HIGH (ref 0.44–1.00)
GFR calc Af Amer: 40 mL/min — ABNORMAL LOW (ref >60)
GFR, EST NON AFRICAN AMERICAN: 35 mL/min — AB (ref >60)
GLUCOSE: 234 mg/dL — AB (ref 65–99)
Potassium: 4.5 mmol/L (ref 3.5–5.1)
SODIUM: 136 mmol/L (ref 135–145)

## 2016-05-09 LAB — GLUCOSE, CAPILLARY
GLUCOSE-CAPILLARY: 235 mg/dL — AB (ref 65–99)
GLUCOSE-CAPILLARY: 337 mg/dL — AB (ref 65–99)
Glucose-Capillary: 233 mg/dL — ABNORMAL HIGH (ref 65–99)
Glucose-Capillary: 231 mg/dL — ABNORMAL HIGH (ref 65–99)

## 2016-05-09 MED ORDER — LEVOFLOXACIN 500 MG PO TABS
500.0000 mg | ORAL_TABLET | Freq: Every day | ORAL | Status: DC
  Administered 2016-05-09: 500 mg via ORAL
  Filled 2016-05-09: qty 1

## 2016-05-09 MED ORDER — METHYLPREDNISOLONE SODIUM SUCC 125 MG IJ SOLR
60.0000 mg | Freq: Two times a day (BID) | INTRAMUSCULAR | Status: DC
  Administered 2016-05-10 (×2): 60 mg via INTRAVENOUS
  Filled 2016-05-09 (×2): qty 2

## 2016-05-09 NOTE — Progress Notes (Signed)
PROGRESS NOTE    [name]  OZD:[id number] DOB: 1946/08/03 DOA: 05/07/2016 PCP: [name], MD    Brief Narrative:  This is a 70 year old female who was admitted to the hospital with shortness of breath and cough. She was found to have acute on chronic diastolic congestive heart failure as well as COPD exacerbation. She was started on intravenous Lasix, steroids and nebulizer treatments.   Assessment & Plan:   Principal Problem:   COPD exacerbation ([place]) Active Problems:   DM type 2 with diabetic peripheral neuropathy (HCC)   Iron deficiency anemia due to chronic blood loss   Acid reflux   Hypertension   Acute on chronic diastolic CHF (congestive heart failure) (HCC)   CKD (chronic kidney disease), stage II   COPD with acute exacerbation ([place])   1. Acute on chronic diastolic congestive heart failure. Patient has had good urine output with intravenous Lasix. Creatinine is mildly trending up. She still has evidence of volume overload. Will continue IV Lasix for now continue to monitor urine output. It's been several years since she's had an echo. Will update her echo. Continue beta blocker  2. COPD exacerbation. Overall wheezing and shortness of breath has improved. We will continue nebulizer treatments. We'll start to taper steroids. Change antibiotics to by mouth  3. Diabetes. Blood sugars are currently stable on Lantus and sliding scale insulin.  4. Iron deficiency anemia. Followed by hematology for iron infusions. Hemoglobin currently stable.  5. GERD. Continue PPI.  6. Hypertension. Blood pressures are improving with diuresis. Continue to follow.  7. Hyperlipidemia. Continue statin  8. OSA. Continue CPAP.   DVT prophylaxis: heparin  Code Status: full Family Communication: discussed with husband and son at the bedside Disposition Plan: discharge home once improved   Consultants:     Procedures:     Antimicrobials:        Subjective: Breathing improving. Wheezing is better. Overall leg swelling is improving  Objective: Vitals:   05/08/16 2000 05/09/16 0554 05/09/16 0749 05/09/16 1401  BP: (!) 159/60 (!) 153/58  (!) 153/73  Pulse: (!) 102 60  88  Resp: 20 20  20   Temp: 98.1 F (36.7 C) 98.1 F (36.7 C)  98.2 F (36.8 C)  TempSrc: Oral Oral  Oral  SpO2: 92% 93% 94% 98%  Weight:  101.7 kg (224 lb 4.8 oz)    Height:        Intake/Output Summary (Last 24 hours) at 05/09/16 1738 Last data filed at 05/09/16 0000  Gross per 24 hour  Intake                0 ml  Output              800 ml  Net             -800 ml   Filed Weights   05/07/16 2357 05/08/16 0301 05/09/16 0554  Weight: 104.3 kg (230 lb) 107 kg (235 lb 12.8 oz) 101.7 kg (224 lb 4.8 oz)    Examination:  General exam: Appears calm and comfortable  Respiratory system: Crackles at bases, scattered rhonchi. Respiratory effort normal. Cardiovascular system: S1 & S2 heard, RRR. No JVD, murmurs, rubs, gallops or clicks. 1-2+ pedal edema. Gastrointestinal system: Abdomen is nondistended, soft and nontender. No organomegaly or masses felt. Normal bowel sounds heard. Central nervous system: Alert and oriented. No focal neurological deficits. Extremities: Symmetric 5 x 5 power. Skin: No rashes, lesions or ulcers Psychiatry: Judgement and insight appear normal.  Mood & affect appropriate.     Data Reviewed: I have personally reviewed following labs and imaging studies  CBC:  Recent Labs Lab 05/08/16 0030  WBC 16.1*  NEUTROABS 10.6*  HGB 11.1*  HCT 34.4*  MCV 85.8  PLT 626   Basic Metabolic Panel:  Recent Labs Lab 05/08/16 0030 05/09/16 0538  NA 138 136  K 4.0 4.5  CL 105 96*  CO2 27 30  GLUCOSE 120* 234*  BUN 30* 45*  CREATININE 1.27* 1.47*  CALCIUM 7.9* 8.0*   GFR: Estimated Creatinine Clearance: 36.9 mL/min (by C-G formula based on SCr of 1.47 mg/dL (H)). Liver Function Tests:  Recent Labs Lab 05/08/16 0030   AST 15  ALT 16  ALKPHOS 96  BILITOT 0.5  PROT 7.3  ALBUMIN 3.6   No results for input(s): LIPASE, AMYLASE in the last 168 hours. No results for input(s): AMMONIA in the last 168 hours. Coagulation Profile: No results for input(s): INR, PROTIME in the last 168 hours. Cardiac Enzymes:  Recent Labs Lab 05/08/16 0030 05/08/16 0336 05/08/16 1017 05/08/16 1546  TROPONINI <0.03 <0.03 0.03* 0.05*   BNP (last 3 results) No results for input(s): PROBNP in the last 8760 hours. HbA1C: No results for input(s): HGBA1C in the last 72 hours. CBG:  Recent Labs Lab 05/08/16 1941 05/08/16 2119 05/09/16 0734 05/09/16 1111 05/09/16 1703  GLUCAP 289* 230* 233* 235* 231*   Lipid Profile: No results for input(s): CHOL, HDL, LDLCALC, TRIG, CHOLHDL, LDLDIRECT in the last 72 hours. Thyroid Function Tests: No results for input(s): TSH, T4TOTAL, FREET4, T3FREE, THYROIDAB in the last 72 hours. Anemia Panel: No results for input(s): VITAMINB12, FOLATE, FERRITIN, TIBC, IRON, RETICCTPCT in the last 72 hours. Sepsis Labs: No results for input(s): PROCALCITON, LATICACIDVEN in the last 168 hours.  No results found for this or any previous visit (from the past 240 hour(s)).       Radiology Studies: Dg Chest Port 1 View  Result Date: 05/08/2016 CLINICAL DATA:  Shortness of breath for 2 weeks, cough and wheezing. EXAM: PORTABLE CHEST 1 VIEW COMPARISON:  Chest radiograph June 20, 2015 FINDINGS: Cardiac silhouette is moderately enlarged but can't unchanged. Mediastinal silhouette is nonsuspicious. Large air-filled hiatal hernia. Mild bronchitic changes without pleural effusion or focal consolidation. Similar strandy densities RIGHT lung base. No pneumothorax. Single lumen RIGHT chest Port-A-Cath with distal tip projecting in proximal superior vena cava. Large body habitus. Osseous structures are nonsuspicious. IMPRESSION: Mild bronchitic changes without focal consolidation. RIGHT lung base  atelectasis versus scarring. Stable cardiomegaly. Large hiatal hernia. Electronically Signed   By: Elon Alas M.D.   On: 05/08/2016 02:14        Scheduled Meds: . allopurinol  100 mg Oral BID  . amitriptyline  100 mg Oral QHS  . aspirin EC  81 mg Oral Daily  . atorvastatin  40 mg Oral QHS  . feeding supplement (GLUCERNA SHAKE)  237 mL Oral TID BM  . feeding supplement (PRO-STAT SUGAR FREE 64)  30 mL Oral BID  . fluticasone furoate-vilanterol  1 puff Inhalation Daily  . furosemide  40 mg Intravenous BID  . heparin  5,000 Units Subcutaneous Q8H  . insulin aspart  0-15 Units Subcutaneous TID WC  . insulin aspart  0-5 Units Subcutaneous QHS  . insulin glargine  25 Units Subcutaneous QHS  . [START ON 05/10/2016] levofloxacin (LEVAQUIN) IV  500 mg Intravenous Q48H  . linaclotide  72 mcg Oral QAC breakfast  . methylPREDNISolone (SOLU-MEDROL) injection  60 mg  Intravenous Q6H  . metoprolol succinate  50 mg Oral Daily  . pantoprazole  40 mg Oral Daily  . sodium chloride flush  3 mL Intravenous Q12H   Continuous Infusions:    LOS: 1 day    Time spent: 8mins    Taneil Lazarus, MD [facility] Pager [phone]  If 7PM-7AM, please contact night-coverage [email or web address] Password [facility] 05/09/2016, 5:38 PM

## 2016-05-09 NOTE — Care Management Note (Signed)
Case Management Note  Patient Details  Name: [name] MRN: [id number] Date of Birth: Jul 18, 1946  Subjective/Objective:                  Admitted with COPD. Pt is from home, lives with her husband and is ind with ADL's. She drives herself to appointments, has PCP and no difficulty affording medications. She uses continuous oxygen at home through [facility]. She has a [facility]. She has a neb machine. She uses a walker for mobility. She manages her own medications and feels she does very well at this. She plans to return home with self care at DC. PT has recommended OP PT and pt is agreeable she woul dlike to go to [facility] in [place]. Referral faxed and called to [facility].   Action/Plan: No further CM needs anticipated.   Expected Discharge Date:      05/10/2016            Expected Discharge Plan:  Home/Self Care  In-House Referral:  NA  Discharge planning Services  CM Consult  Post Acute Care Choice:  NA Choice offered to:  NA  DME Arranged:    DME Agency:     HH Arranged:    HH Agency:     Status of Service:  Completed, signed off  If discussed at [employer] of Stay Meetings, dates discussed:    Additional Comments:  Sherald Barge, RN 05/09/2016, 2:48 PM

## 2016-05-09 NOTE — Progress Notes (Addendum)
Inpatient Diabetes Program Recommendations  AACE/ADA: New Consensus Statement on Inpatient Glycemic Control (2015)  Target Ranges:  Prepandial:   less than 140 mg/dL      Peak postprandial:   less than 180 mg/dL (1-2 hours)      Critically ill patients:  140 - 180 mg/dL  Results for ZAKARIAH, DEJARNETTE (MRN [id number]) as of 05/09/2016 10:07  Ref. Range 05/08/2016 07:13 05/08/2016 11:32 05/08/2016 16:44 05/08/2016 19:41 05/08/2016 21:19 05/09/2016 07:34  Glucose-Capillary Latest Ref Range: 65 - 99 mg/dL 313 (H) 289 (H) 217 (H) 289 (H) 230 (H) 233 (H)    Review of Glycemic Control  Diabetes history: DM2 Outpatient Diabetes medications: Lantus 26 units QHS, Humalog (sliding scale with meals), Metformin 1000 mg BID Current orders for Inpatient glycemic control: Lantus 25 units QHS, Novolog 0-15 units TID with meals, Novolog 0-5 units QHS  Inpatient Diabetes Program Recommendations: Insulin - Basal: Fasting glucose 233 mg/dl this morning. Please consider increasing Lantus to 28 units QHS. Insulin - Meal Coverage: Please consider ordering Novolog 5 units TID with meals for meal coverage if patient eats at least 50% of meals.  Thanks, Barnie Alderman, RN, MSN, CDE Diabetes Coordinator Inpatient Diabetes Program [phone] (Team Pager from [place] to [place]) [phone] ([place] office) [phone] [facility] office) [phone] [facility] office)

## 2016-05-10 ENCOUNTER — Inpatient Hospital Stay (HOSPITAL_COMMUNITY): Payer: Managed Care, Other (non HMO)

## 2016-05-10 DIAGNOSIS — I509 Heart failure, unspecified: Secondary | ICD-10-CM

## 2016-05-10 LAB — BASIC METABOLIC PANEL WITH GFR
Anion gap: 12 (ref 5–15)
BUN: 67 mg/dL — ABNORMAL HIGH (ref 6–20)
CO2: 30 mmol/L (ref 22–32)
Calcium: 7.5 mg/dL — ABNORMAL LOW (ref 8.9–10.3)
Chloride: 96 mmol/L — ABNORMAL LOW (ref 101–111)
Creatinine, Ser: 1.69 mg/dL — ABNORMAL HIGH (ref 0.44–1.00)
GFR calc Af Amer: 34 mL/min — ABNORMAL LOW
GFR calc non Af Amer: 29 mL/min — ABNORMAL LOW
Glucose, Bld: 195 mg/dL — ABNORMAL HIGH (ref 65–99)
Potassium: 4.1 mmol/L (ref 3.5–5.1)
Sodium: 138 mmol/L (ref 135–145)

## 2016-05-10 LAB — ECHOCARDIOGRAM COMPLETE
HEIGHTINCHES: 59 in
WEIGHTICAEL: 3561.6 [oz_av]

## 2016-05-10 LAB — GLUCOSE, CAPILLARY
GLUCOSE-CAPILLARY: 292 mg/dL — AB (ref 65–99)
Glucose-Capillary: 207 mg/dL — ABNORMAL HIGH (ref 65–99)
Glucose-Capillary: 209 mg/dL — ABNORMAL HIGH (ref 65–99)

## 2016-05-10 MED ORDER — PERFLUTREN LIPID MICROSPHERE
1.0000 mL | INTRAVENOUS | Status: AC | PRN
  Administered 2016-05-10: 2 mL via INTRAVENOUS
  Filled 2016-05-10: qty 10

## 2016-05-10 MED ORDER — LEVOFLOXACIN 500 MG PO TABS: 500.0000 mg | ORAL_TABLET | ORAL | Status: DC

## 2016-05-10 MED ORDER — HEPARIN SOD (PORK) LOCK FLUSH 100 UNIT/ML IV SOLN: 500.0000 [IU] | INTRAVENOUS | Status: DC | PRN

## 2016-05-10 MED ORDER — ALBUTEROL SULFATE (2.5 MG/3ML) 0.083% IN NEBU: 2.5000 mg | INHALATION_SOLUTION | Freq: Four times a day (QID) | RESPIRATORY_TRACT | 12 refills | Status: DC | PRN

## 2016-05-10 MED ORDER — PREDNISONE 10 MG PO TABS: ORAL_TABLET | ORAL | 0 refills | Status: DC

## 2016-05-10 MED ORDER — HEPARIN SOD (PORK) LOCK FLUSH 100 UNIT/ML IV SOLN
500.0000 [IU] | Freq: Once | INTRAVENOUS | Status: AC
  Administered 2016-05-10: 500 [IU]
  Filled 2016-05-10: qty 5

## 2016-05-10 NOTE — Progress Notes (Signed)
Pt discharged home today per Dr. Memon. Pt's IV site D/C'd and WDL. Pt's VSS. Pt provided with home medication list, discharge instructions and prescriptions. Verbalized understanding. Pt left floor via WC in stable condition accompanied by NT.  

## 2016-05-10 NOTE — Discharge Summary (Addendum)
Physician Discharge Summary  [name] CVE:[id number] DOB: May 30, 1946 DOA: 05/07/2016  PCP: [name], MD  Admit date: 05/07/2016 Discharge date: 05/10/2016  Admitted From: home Disposition:  home  Recommendations for Outpatient Follow-up:  1. Follow up with PCP in 1-2 weeks 2. Please obtain BMP/CBC in one week   Home Health:home health RN Equipment/Devices:  Discharge Condition: stable CODE STATUS: full Diet recommendation: Heart Healthy/carb modified  Brief/Interim Summary: This is a 70 year old female who was admitted to the hospital with shortness of breath and cough. She was found to have acute on chronic diastolic congestive heart failure as well as COPD exacerbation. She was started on intravenous Lasix, steroids and nebulizer treatments.  Discharge Diagnoses:  Principal Problem:   COPD exacerbation ([place]) Active Problems:   DM type 2 with diabetic peripheral neuropathy (HCC)   Iron deficiency anemia due to chronic blood loss   Acid reflux   Hypertension   Acute on chronic diastolic CHF (congestive heart failure) (HCC)   CKD (chronic kidney disease), stage II   COPD with acute exacerbation ([place])   Morbid obesity due to excess calories   1. Acute on chronic diastolic congestive heart failure. Patient has had good urine output with intravenous Lasix. Echo shows normal EF with grade 2 diastolic dysfunction. Weight trended down approximately 13 lbs with diuresis. Overall edema has improved. She has been transitioned to oral lasix. Continue beta blocker  2. COPD exacerbation. Overall wheezing and shortness of breath has improved. She was treated with IV steroids, nebs and abx. With improvement in her wheezing, she was transitioned to prednisone taper and continued on neb treatments at home. She feels her breathing is approaching baseline  3. Diabetes. Blood sugars are currently stable on Lantus and sliding scale insulin.  4. Iron deficiency anemia. Followed  by hematology for iron infusions. Hemoglobin currently stable.  5. GERD. Continue PPI.  6. Hypertension. Blood pressures are improving with diuresis. Continue to follow.  7. Hyperlipidemia. Continue statin  8. OSA. Continue CPAP.  Discharge Instructions  Discharge Instructions    DME Nebulizer machine    Complete by:  As directed    Diet - low sodium heart healthy    Complete by:  As directed    Face-to-face encounter (required for Medicare/Medicaid patients)    Complete by:  As directed    I [name] certify that this patient is under my care and that I, or a nurse practitioner or physician's assistant working with me, had a face-to-face encounter that meets the physician face-to-face encounter requirements with this patient on 05/10/2016. The encounter with the patient was in whole, or in part for the following medical condition(s) which is the primary reason for home health care (List medical condition): chf and copd exacerbations   The encounter with the patient was in whole, or in part, for the following medical condition, which is the primary reason for home health care:  copd exacerbation, chf exacerbation   I certify that, based on my findings, the following services are medically necessary home health services:  Nursing   Reason for Medically Necessary Home Health Services:  Skilled Nursing- Skilled Assessment/Observation   My clinical findings support the need for the above services:  Shortness of breath with activity   Further, I certify that my clinical findings support that this patient is homebound due to:  Shortness of Breath with activity   Home Health    Complete by:  As directed    To provide the following care/treatments:  RN   Increase activity slowly    Complete by:  As directed        Medication List    STOP taking these medications   cefdinir 300 MG capsule Commonly known as:  OMNICEF   naproxen sodium 220 MG tablet Commonly known as:  ANAPROX    predniSONE 10 MG (21) Tbpk tablet Commonly known as:  STERAPRED UNI-PAK 21 TAB Replaced by:  predniSONE 10 MG tablet     TAKE these medications   albuterol (2.5 MG/3ML) 0.083% nebulizer solution Commonly known as:  PROVENTIL Take 3 mLs (2.5 mg total) by nebulization every 6 (six) hours as needed for wheezing or shortness of breath.   allopurinol 100 MG tablet Commonly known as:  ZYLOPRIM Take 1 tablet by mouth 2 (two) times daily.   amitriptyline 100 MG tablet Commonly known as:  ELAVIL Take 100 mg by mouth at bedtime.   aspirin EC 81 MG tablet Take 81 mg by mouth daily.   atorvastatin 40 MG tablet Commonly known as:  LIPITOR Take 1 tablet by mouth at bedtime.   B-D ULTRA-FINE 33 LANCETS Misc USE TO CHECK BG 3 TIMES A DAY   BIOTIN PO Take 1 capsule by mouth daily.   BREO ELLIPTA 100-25 MCG/INH Aepb Generic drug:  fluticasone furoate-vilanterol Inhale 1 puff into the lungs daily.   cyanocobalamin 1000 MCG/ML injection Commonly known as:  (VITAMIN B-12) Inject 1 mL (1,000 mcg total) into the muscle every 30 (thirty) days.   cyanocobalamin 1000 MCG/ML injection Commonly known as:  (VITAMIN B-12) Inject 1 mL (1,000 mcg total) into the muscle every 30 (thirty) days.   cyanocobalamin 1000 MCG/ML injection Commonly known as:  (VITAMIN B-12) Inject 1 mL (1,000 mcg total) into the muscle every 30 (thirty) days.   esomeprazole 40 MG capsule Commonly known as:  NEXIUM Take 40 mg by mouth daily at 12 noon.   famotidine 40 MG tablet Commonly known as:  PEPCID TAKE 1 TABLET AT BEDTIME AS NEEDED TO CONTROL REFLUX What changed:  how much to take  how to take this  when to take this  reasons to take this  additional instructions   furosemide 40 MG tablet Commonly known as:  LASIX Take 40 mg by mouth daily.   insulin lispro 100 UNIT/ML KiwkPen Commonly known as:  HUMALOG Use as directed [place] by sliding scale with meals. MDD=50 units   LANTUS SOLOSTAR 100  UNIT/ML Solostar Pen Generic drug:  Insulin Glargine Inject 26 Units into the skin at bedtime.   linaclotide 72 MCG capsule Commonly known as:  LINZESS Take 1 capsule (72 mcg total) by mouth daily before breakfast. What changed:  when to take this   loperamide 2 MG capsule Commonly known as:  IMODIUM Take 2 mg by mouth as needed for diarrhea or loose stools.   meclizine 25 MG tablet Commonly known as:  ANTIVERT Take 1 tablet (25 mg total) by mouth 3 (three) times daily as needed for dizziness.   metFORMIN 1000 MG tablet Commonly known as:  GLUCOPHAGE Take 1,000 mg by mouth 2 (two) times daily with a meal.   metoprolol succinate 25 MG 24 hr tablet Commonly known as:  TOPROL-XL Take 50 mg by mouth daily.   ondansetron 4 MG tablet Commonly known as:  ZOFRAN Take 1 tablet (4 mg total) by mouth every 8 (eight) hours as needed for nausea or vomiting.   predniSONE 10 MG tablet Commonly known as:  DELTASONE Take 40mg  daily for 2  days then 30mg  daily for 2 days then 20mg  daily for 2 days then 10mg  daily for 2 days then stop Replaces:  predniSONE 10 MG (21) Tbpk tablet   tiotropium 18 MCG inhalation capsule Commonly known as:  SPIRIVA Place 18 mcg into inhaler and inhale daily.      Follow-up Information    TAPPER,DAVID B, MD Follow up in 2 week(s).   Specialty:  Family Medicine Contact information: [place] [address] [address] Follow up in 1 week(s).   Specialty:  Cardiology Contact information: [address] [place] [phone]         No Known Allergies  Consultations:     Procedures/Studies: Dg Chest Port 1 View  Result Date: 05/08/2016 CLINICAL DATA:  Shortness of breath for 2 weeks, cough and wheezing. EXAM: PORTABLE CHEST 1 VIEW COMPARISON:  Chest radiograph June 20, 2015 FINDINGS: Cardiac silhouette is moderately enlarged but can't  unchanged. Mediastinal silhouette is nonsuspicious. Large air-filled hiatal hernia. Mild bronchitic changes without pleural effusion or focal consolidation. Similar strandy densities RIGHT lung base. No pneumothorax. Single lumen RIGHT chest Port-A-Cath with distal tip projecting in proximal superior vena cava. Large body habitus. Osseous structures are nonsuspicious. IMPRESSION: Mild bronchitic changes without focal consolidation. RIGHT lung base atelectasis versus scarring. Stable cardiomegaly. Large hiatal hernia. Electronically Signed   By: Elon Alas M.D.   On: 05/08/2016 02:14    Echo: Mild LVH with LVEF 60-65%. Grade 2 diastolic dysfunction with   increased LV filling pressure. Moderate left atrial enlargement.   MAC with mildly thickened and calcified mitral leaflets, mild   mitral regurgitation. Sclerotic aortic valve with trivial aortic   regurgitation. Trivial tricuspid regurgitation with PASP 43 mmHg.   Small circumferential pericardial effusion, possibly somewhat   larger collection posteriorly.   Subjective: Shortness of breath has improved. Wheezing resolved.  Discharge Exam: Vitals:   05/10/16 0500 05/10/16 1458  BP: (!) 113/34 (!) 139/55  Pulse: 72 84  Resp: 20 20  Temp: 98.2 F (36.8 C) 98 F (36.7 C)   Vitals:   05/09/16 2156 05/10/16 0500 05/10/16 0945 05/10/16 1458  BP: (!) 137/50 (!) 113/34  (!) 139/55  Pulse: 91 72  84  Resp: 20 20  20   Temp: 98.9 F (37.2 C) 98.2 F (36.8 C)  98 F (36.7 C)  TempSrc: Oral Oral  Oral  SpO2: 100% 93% 96% 100%  Weight:  101 kg (222 lb 9.6 oz)    Height:  4\' 11"  (1.499 m)      General: Pt is alert, awake, not in acute distress Cardiovascular: RRR, S1/S2 +, no rubs, no gallops Respiratory: CTA bilaterally, no wheezing, no rhonchi Abdominal: Soft, NT, ND, bowel sounds + Extremities: trace edema, no cyanosis    The results of significant diagnostics from this hospitalization (including imaging, microbiology,  ancillary and laboratory) are listed below for reference.     Microbiology: No results found for this or any previous visit (from the past 240 hour(s)).   Labs: BNP (last 3 results)  Recent Labs  05/08/16 0030  BNP 93.7   Basic Metabolic Panel:  Recent Labs Lab 05/08/16 0030 05/09/16 0538 05/10/16 0609  NA 138 136 138  K 4.0 4.5 4.1  CL 105 96* 96*  CO2 27 30 30   GLUCOSE 120* 234* 195*  BUN 30* 45* 67*  CREATININE 1.27* 1.47* 1.69*  CALCIUM 7.9* 8.0* 7.5*   Liver Function Tests:  Recent Labs Lab 05/08/16 0030  AST 15  ALT 16  ALKPHOS 96  BILITOT 0.5  PROT 7.3  ALBUMIN 3.6   No results for input(s): LIPASE, AMYLASE in the last 168 hours. No results for input(s): AMMONIA in the last 168 hours. CBC:  Recent Labs Lab 05/08/16 0030  WBC 16.1*  NEUTROABS 10.6*  HGB 11.1*  HCT 34.4*  MCV 85.8  PLT 311   Cardiac Enzymes:  Recent Labs Lab 05/08/16 0030 05/08/16 0336 05/08/16 1017 05/08/16 1546  TROPONINI <0.03 <0.03 0.03* 0.05*   BNP: Invalid input(s): POCBNP CBG:  Recent Labs Lab 05/09/16 1703 05/09/16 2056 05/10/16 0725 05/10/16 1209 05/10/16 1640  GLUCAP 231* 337* 207* 292* 209*   D-Dimer No results for input(s): DDIMER in the last 72 hours. Hgb A1c No results for input(s): HGBA1C in the last 72 hours. Lipid Profile No results for input(s): CHOL, HDL, LDLCALC, TRIG, CHOLHDL, LDLDIRECT in the last 72 hours. Thyroid function studies No results for input(s): TSH, T4TOTAL, T3FREE, THYROIDAB in the last 72 hours.  Invalid input(s): FREET3 Anemia work up No results for input(s): VITAMINB12, FOLATE, FERRITIN, TIBC, IRON, RETICCTPCT in the last 72 hours. Urinalysis    Component Value Date/Time   COLORURINE YELLOW 06/27/2015 2151   APPEARANCEUR CLEAR 06/27/2015 2151   LABSPEC 1.015 06/27/2015 2151   PHURINE 6.5 06/27/2015 2151   GLUCOSEU NEGATIVE 06/27/2015 2151   HGBUR NEGATIVE 06/27/2015 2151   BILIRUBINUR NEGATIVE 06/27/2015 2151    KETONESUR NEGATIVE 06/27/2015 2151   PROTEINUR NEGATIVE 06/27/2015 2151   UROBILINOGEN 0.2 01/29/2007 1500   NITRITE NEGATIVE 06/27/2015 2151   LEUKOCYTESUR NEGATIVE 06/27/2015 2151   Sepsis Labs Invalid input(s): PROCALCITONIN,  WBC,  LACTICIDVEN Microbiology No results found for this or any previous visit (from the past 240 hour(s)).   Time coordinating discharge: Over 30 minutes  SIGNED:   Kathie Dike, MD  [facility] 05/10/2016, 5:56 PM Pager   If 7PM-7AM, please contact night-coverage [email or web address] Password [email or web address]

## 2016-05-10 NOTE — Progress Notes (Signed)
*  PRELIMINARY RESULTS* Echocardiogram 2D Echocardiogram has been performed with Definity.  Samuel Germany 05/10/2016, 1:12 PM

## 2016-05-10 NOTE — Care Management Important Message (Signed)
Important Message  Patient Details  Name: [name] MRN: [id number] Date of Birth: August 21, 1945   Medicare Important Message Given:  Yes    Sherald Barge, RN 05/10/2016, 7:42 AM

## 2016-05-10 NOTE — Progress Notes (Signed)
Inpatient Diabetes Program Recommendations  AACE/ADA: New Consensus Statement on Inpatient Glycemic Control (2015)  Target Ranges:  Prepandial:   less than 140 mg/dL      Peak postprandial:   less than 180 mg/dL (1-2 hours)      Critically ill patients:  140 - 180 mg/dL   Results for [name], [name] (MRN [id number]) as of 05/10/2016 09:12  Ref. Range 05/09/2016 07:34 05/09/2016 11:11 05/09/2016 17:03 05/09/2016 20:56 05/10/2016 07:25  Glucose-Capillary Latest Ref Range: 65 - 99 mg/dL 233 (H) 235 (H) 231 (H) 337 (H) 207 (H)   Review of Glycemic Control  Diabetes history: DM2 Outpatient Diabetes medications: Lantus 26 units QHS, Humalog (sliding scale with meals), Metformin 1000 mg BID Current orders for Inpatient glycemic control: Lantus 25 units QHS, Novolog 0-15 units TID with meals, Novolog 0-5 units QHS  Inpatient Diabetes Program Recommendations: Insulin - Basal: Glucose ranged from 231-337 mg/dl on 05/09/16 and fasting glucose 207 mg/dl this morning. Please consider increasing Lantus to 28 units QHS. Insulin - Meal Coverage: Post prandial glucose is consistently elevated. Please consider ordering Novolog 5 units TID with meals for meal coverage if patient eats at least 50% of meals.  Thanks, Barnie Alderman, RN, MSN, CDE Diabetes Coordinator Inpatient Diabetes Program [phone] (Team Pager from [place] to [place]) [phone] ([place] office) [phone] [facility] office) [phone] [facility] office)

## 2016-05-15 ENCOUNTER — Encounter: Payer: Self-pay | Admitting: Cardiology

## 2016-05-15 NOTE — Progress Notes (Signed)
Cardiology Office Note  Date: 05/16/2016   ID: BRESHAE [name], DOB 01-31-1946, MRN [id number]  PCP: [name], MD  Primary Cardiologist: [name], MD   Chief Complaint  Patient presents with  . Hospitalization Follow-up    History of Present Illness: [name] is a 70 y.o. female presenting for a post hospital visit. I have not seen her since 2014. She was recently admitted to [facility] on the hospitalist team with acute on chronic diastolic heart failure as well as COPD exacerbation.She comes in today stating that she does feel better, no longer coughing and short of breath. She still has fairly significant leg edema, but it is actually better than it was initially by her account. She is currently on Lasix 40 mg daily which is what she was taking before she was hospitalized.  She has a history of nonobstructive CAD documented at cardiac catheterization in 2000 with follow-up low risk Cardiolite study in 2014. She did have an updated echocardiogram during the recent hospital stay, LVEF 60-65% with grade 2 diastolic dysfunction.  I reviewed her medications which are outlined below. Cardiac regimen includes Lasix, Toprol-XL, Lipitor, and aspirin.  She saw Dr. Scotty Court in the office for follow-up yesterday.  Past Medical History:  Diagnosis Date  . CAD (coronary artery disease)    Nonobstructive at cardiac catheterization 2000  . Chronic back pain   . COPD (chronic obstructive pulmonary disease) ([place])   . Degenerative disc disease   . Essential hypertension, benign   . GERD (gastroesophageal reflux disease)   . Gout   . Hiatal hernia 07/27/2013  . History of diverticulitis of colon   . History of hiatal hernia   . Iron deficiency anemia   . Irritable bowel syndrome   . Lumbar radiculopathy   . Mixed hyperlipidemia   . Neuropathy ([place])   . Ovarian cancer ([place]) 04/02/2016  . Sleep apnea   . Type 2 diabetes mellitus ([place])   . Vitamin B deficiency  12/25/2009  . Vitamin B12 deficiency     Past Surgical History:  Procedure Laterality Date  . ABDOMINAL HYSTERECTOMY    . BACK SURGERY    . Benign breast cysts    . CHOLECYSTECTOMY    . COLONOSCOPY  10/01/2006   SLF:Pan colonic diverticulosis and moderate internal hemorrhoids/ Otherwise no polyps, masses, inflammatory changes or AVMs/  . COLONOSCOPY  2011   SLF: pancolonic diverticulosis, large internal hemorrhoids  . COLONOSCOPY N/A 01/26/2016   Procedure: COLONOSCOPY;  Surgeon: [name], MD;  Location: [facility];  Service: Endoscopy;  Laterality: N/A;  830   . ESOPHAGOGASTRODUODENOSCOPY  11/19/2006   SLF: Large hiatal hernia without evidence of Cameron ulcers/. Distal esophageal stricture, which allowed the gastroscope to pass without resistance.  A 16 mm Savary later passed with mild resistance/ Normal stomach.sb bx negative  . ESOPHAGOGASTRODUODENOSCOPY  10/01/2006   YTK:[id number] hiatal hernia.  Distal esophagus without evidence of   erythema, ulceration or Barrett's esophagus  . ESOPHAGOGASTRODUODENOSCOPY  2011   SLF: large hh, distal esophageal web narrowing to 38mm s/p dilation to 40mm  . ESOPHAGOGASTRODUODENOSCOPY N/A 08/06/2013   SLF: 1. Stricture at the gastroesophageal junction 2. large hiatal hernia. 3. Mild erosive gastritis.  Marland Kitchen GIVENS CAPSULE STUDY N/A 08/06/2013   INCOMPLETE-SMALL BOWLE ULCERS  . KNEE SURGERY Right   . PARTIAL HYSTERECTOMY  1978  . small bowel capsule  2008   negative  . TONSILLECTOMY AND ADENOIDECTOMY    . Two back  surgeries/fusion    . UMBILICAL HERNIA REPAIR  2010    Current Outpatient Prescriptions  Medication Sig Dispense Refill  . albuterol (PROVENTIL) (2.5 MG/3ML) 0.083% nebulizer solution Take 3 mLs (2.5 mg total) by nebulization every 6 (six) hours as needed for wheezing or shortness of breath. 75 mL 12  . allopurinol (ZYLOPRIM) 100 MG tablet Take 1 tablet by mouth 2 (two) times daily.    Marland Kitchen amitriptyline (ELAVIL) 100 MG tablet Take  100 mg by mouth at bedtime.      Marland Kitchen aspirin EC 81 MG tablet Take 81 mg by mouth daily.    Marland Kitchen atorvastatin (LIPITOR) 40 MG tablet Take 1 tablet by mouth at bedtime.     . B-D ULTRA-FINE 33 LANCETS MISC USE TO CHECK BG 3 TIMES A DAY    . BIOTIN PO Take 1 capsule by mouth daily.     . cyanocobalamin (,VITAMIN B-12,) 1000 MCG/ML injection Inject 1 mL (1,000 mcg total) into the muscle every 30 (thirty) days. 1 mL 1  . esomeprazole (NEXIUM) 40 MG capsule Take 40 mg by mouth 2 (two) times daily.     . famotidine (PEPCID) 40 MG tablet TAKE 1 TABLET AT BEDTIME AS NEEDED TO CONTROL REFLUX (Patient taking differently: Take 40 mg by mouth at bedtime as needed for heartburn or indigestion. ) 90 tablet 1  . fluticasone furoate-vilanterol (BREO ELLIPTA) 100-25 MCG/INH AEPB Inhale 1 puff into the lungs daily.    . insulin lispro (HUMALOG) 100 UNIT/ML KiwkPen Use as directed [place] by sliding scale with meals. MDD=50 units    . LANTUS SOLOSTAR 100 UNIT/ML SOPN Inject 26 Units into the skin at bedtime.     Marland Kitchen linaclotide (LINZESS) 72 MCG capsule Take 1 capsule (72 mcg total) by mouth daily before breakfast. (Patient taking differently: Take 72 mcg by mouth every other day. ) 90 capsule 3  . loperamide (IMODIUM) 2 MG capsule Take 2 mg by mouth as needed for diarrhea or loose stools.    . meclizine (ANTIVERT) 25 MG tablet Take 1 tablet (25 mg total) by mouth 3 (three) times daily as needed for dizziness. 30 tablet 0  . metFORMIN (GLUCOPHAGE) 500 MG tablet Take 1,000 mg by mouth 2 (two) times daily.    . metoprolol succinate (TOPROL-XL) 25 MG 24 hr tablet Take 50 mg by mouth daily.     . ondansetron (ZOFRAN) 4 MG tablet Take 1 tablet (4 mg total) by mouth every 8 (eight) hours as needed for nausea or vomiting. 45 tablet 2  . tiotropium (SPIRIVA) 18 MCG inhalation capsule Place 18 mcg into inhaler and inhale daily.    . furosemide (LASIX) 40 MG tablet Take 1.5 tablets (60 mg total) by mouth daily. 45 tablet 1   No current  facility-administered medications for this visit.    Allergies:  Review of patient's allergies indicates no known allergies.   Social History: The patient  reports that she quit smoking about 54 years ago. Her smoking use included Cigarettes. She has a 1.00 pack-year smoking history. She has never used smokeless tobacco. She reports that she does not drink alcohol or use drugs.   Family History: The patient's family history includes Asthma in her mother and sister; Colon cancer in her brother; Diabetes in her brother, mother, and sister; Heart attack in her brother, maternal grandmother, mother, other, and sister; Kidney failure in her sister; Stroke in her maternal grandmother, mother, and sister; Ulcers in her mother and sister.   ROS:  Please see the history of present illness. Otherwise, complete review of systems is positive for chronic leg swelling.  All other systems are reviewed and negative.   Physical Exam: VS:  BP (!) 146/75   Pulse (!) 108   Ht 4\' 11"  (1.499 m)   Wt 223 lb 12.8 oz (101.5 kg)   SpO2 94%   BMI 45.20 kg/m , BMI Body mass index is 45.2 kg/m.  Wt Readings from Last 3 Encounters:  05/16/16 223 lb 12.8 oz (101.5 kg)  05/10/16 222 lb 9.6 oz (101 kg)  04/25/16 230 lb 3.2 oz (104.4 kg)    General: Overweight woman, appears comfortable at rest. HEENT: Conjunctiva and lids normal, oropharynx clear. Neck: Supple, no elevated JVP or carotid bruits, no thyromegaly. Lungs: Clear to auscultation, nonlabored breathing at rest. Cardiac: Regular rate and rhythm, S4, no significant systolic murmur, no pericardial rub. Abdomen: Soft, nontender, bowel sounds present. Extremities: Chronic appearing leg edema with venous stasis and mild areas of weeping, distal pulses 1-2+. Skin: Warm and dry. Musculoskeletal: No kyphosis. Neuropsychiatric: Alert and oriented x3, affect grossly appropriate.  ECG: I personally reviewed the tracing from 05/08/2016 which showed sinus tachycardia  with nonspecific ST abnormalities.  Recent Labwork: 05/08/2016: ALT 16; AST 15; B Natriuretic Peptide 79.0; Hemoglobin 11.1; Platelets 311 05/10/2016: BUN 67; Creatinine, Ser 1.69; Potassium 4.1; Sodium 138   Other Studies Reviewed Today:  Echocardiogram 05/10/2016: Study Conclusions  - Left ventricle: The cavity size was normal. Wall thickness was   increased in a pattern of mild LVH. There was moderate focal   basal hypertrophy of the septum. Systolic function was normal.   The estimated ejection fraction was in the range of 60% to 65%.   Wall motion was normal; there were no regional wall motion   abnormalities. Features are consistent with a pseudonormal left   ventricular filling pattern, with concomitant abnormal relaxation   and increased filling pressure (grade 2 diastolic dysfunction). - Aortic valve: Mildly calcified annulus. Trileaflet; mildly   calcified leaflets. There was trivial regurgitation. Mean   gradient (S): 5 mm Hg. Valve area (Vmax): 1.92 cm^2. - Mitral valve: Calcified annulus. Mildly thickened leaflets .   There was mild regurgitation. - Left atrium: The atrium was moderately dilated. - Right atrium: Central venous pressure (est): 3 mm Hg. - Tricuspid valve: There was trivial regurgitation. - Pulmonary arteries: PA peak pressure: 43 mm Hg (S). - Pericardium, extracardiac: Small circumferential pericardial   effusion, possibly somewhat larger collection posteriorly.  Impressions:  - Mild LVH with LVEF 60-65%. Grade 2 diastolic dysfunction with   increased LV filling pressure. Moderate left atrial enlargement.   MAC with mildly thickened and calcified mitral leaflets, mild   mitral regurgitation. Sclerotic aortic valve with trivial aortic   regurgitation. Trivial tricuspid regurgitation with PASP 43 mmHg.   Small circumferential pericardial effusion, possibly somewhat   larger collection posteriorly.  Chest x-ray 05/08/2016: FINDINGS: Cardiac  silhouette is moderately enlarged but can't unchanged. Mediastinal silhouette is nonsuspicious. Large air-filled hiatal hernia. Mild bronchitic changes without pleural effusion or focal consolidation. Similar strandy densities RIGHT lung base. No pneumothorax. Single lumen RIGHT chest Port-A-Cath with distal tip projecting in proximal superior vena cava. Large body habitus. Osseous structures are nonsuspicious.  IMPRESSION: Mild bronchitic changes without focal consolidation. RIGHT lung base atelectasis versus scarring.  Stable cardiomegaly.  Large hiatal hernia.  Assessment and Plan:  1. Chronic diastolic heart failure, recent echocardiogram showing LVEF 60-65% with grade 2 diastolic dysfunction. She had a  good IV diuresis during hospital stay, still has leg edema that I suspect is not at true baseline. We will increase her Lasix to 60 mg daily for now, follow-up BMET in 2 weeks. Clinical visit thereafter.  2. Renal insufficiency, recent creatinine 1.7.  3. History of nonobstructive CAD with no active angina symptoms. She continues on aspirin and statin.  4. Essential hypertension, no changes made to remaining regimen at this time.  Current medicines were reviewed with the patient today.   Orders Placed This Encounter  Procedures  . Basic Metabolic Panel (BMET)    Disposition: Follow-up with me in one month.  Signed, Satira Sark, MD, [facility] 05/16/2016 3:23 PM    [facility] [place], [place], [place] [address] Phone: [phone]; Fax: [phone]

## 2016-05-16 ENCOUNTER — Ambulatory Visit (INDEPENDENT_AMBULATORY_CARE_PROVIDER_SITE_OTHER): Payer: Managed Care, Other (non HMO) | Admitting: Cardiology

## 2016-05-16 ENCOUNTER — Encounter: Payer: Self-pay | Admitting: Cardiology

## 2016-05-16 VITALS — BP 146/75 | HR 108 | Ht 59.0 in | Wt 223.8 lb

## 2016-05-16 DIAGNOSIS — I5032 Chronic diastolic (congestive) heart failure: Secondary | ICD-10-CM | POA: Diagnosis not present

## 2016-05-16 DIAGNOSIS — N289 Disorder of kidney and ureter, unspecified: Secondary | ICD-10-CM

## 2016-05-16 DIAGNOSIS — I25810 Atherosclerosis of coronary artery bypass graft(s) without angina pectoris: Secondary | ICD-10-CM

## 2016-05-16 DIAGNOSIS — I10 Essential (primary) hypertension: Secondary | ICD-10-CM | POA: Diagnosis not present

## 2016-05-16 MED ORDER — FUROSEMIDE 40 MG PO TABS
60.0000 mg | ORAL_TABLET | Freq: Every day | ORAL | 1 refills | Status: DC
Start: 2016-05-16 — End: 2016-06-13

## 2016-05-16 NOTE — Patient Instructions (Signed)
Your physician recommends that you schedule a follow-up appointment in: 1 month with Dr. Domenic Polite  Your physician has recommended you make the following change in your medication:   [place]  Your physician recommends that you return for lab work in: 2 WEEKS BMP  Thank you for choosing [facility]!!

## 2016-05-28 ENCOUNTER — Encounter (HOSPITAL_COMMUNITY): Payer: PRIVATE HEALTH INSURANCE

## 2016-06-10 ENCOUNTER — Encounter: Payer: Self-pay | Admitting: Gastroenterology

## 2016-06-12 ENCOUNTER — Telehealth: Payer: Self-pay | Admitting: *Deleted

## 2016-06-12 NOTE — Telephone Encounter (Signed)
-----   Message from Satira Sark, MD sent at 06/11/2016  1:27 PM EST ----- Results reviewed. Relatively stable renal function, creatinine 1.4, potassium 5.2. Continue with current regimen, will review at office follow-up. A copy of this test should be forwarded to [facility] B, MD.

## 2016-06-12 NOTE — Telephone Encounter (Signed)
Patient informed. Copy sent to PCP °

## 2016-06-12 NOTE — Progress Notes (Signed)
Cardiology Office Note  Date: 06/13/2016   ID: TAMIYAH [name], DOB 05/28/1946, MRN [id number]  PCP: [name], MD  Primary Cardiologist: [name], MD   Chief Complaint  Patient presents with  . Chronic diastolic heart failure    History of Present Illness: [name] is a 70 y.o. female last seen in October. She presents for a follow-up visit. At the last visit we increased her Lasix to 60 mg daily. Follow-up lab work is outlined below. She has had no major change in weight or decrease in leg edema. Breathing status has not deteriorated. She does not report any chest pain.  Would over her medications. She reports compliance. We discussed changing from Lasix to Demadex to see if this might be more of an effective diuretic for her. Not pursuing Aldactone given high normal potassium in the absence of potassium supplements.  Past Medical History:  Diagnosis Date  . CAD (coronary artery disease)    Nonobstructive at cardiac catheterization 2000  . Chronic back pain   . COPD (chronic obstructive pulmonary disease) ([place])   . Degenerative disc disease   . Essential hypertension, benign   . GERD (gastroesophageal reflux disease)   . Gout   . Hiatal hernia 07/27/2013  . History of diverticulitis of colon   . History of hiatal hernia   . Iron deficiency anemia   . Irritable bowel syndrome   . Lumbar radiculopathy   . Mixed hyperlipidemia   . Neuropathy ([place])   . Ovarian cancer ([place]) 04/02/2016  . Sleep apnea   . Type 2 diabetes mellitus ([place])   . Vitamin B deficiency 12/25/2009  . Vitamin B12 deficiency     Past Surgical History:  Procedure Laterality Date  . ABDOMINAL HYSTERECTOMY    . BACK SURGERY    . Benign breast cysts    . CHOLECYSTECTOMY    . COLONOSCOPY  10/01/2006   SLF:Pan colonic diverticulosis and moderate internal hemorrhoids/ Otherwise no polyps, masses, inflammatory changes or AVMs/  . COLONOSCOPY  2011   SLF: pancolonic  diverticulosis, large internal hemorrhoids  . COLONOSCOPY N/A 01/26/2016   Procedure: COLONOSCOPY;  Surgeon: [name], MD;  Location[facility];  Service: Endoscopy;  Laterality: N/A;  830   . ESOPHAGOGASTRODUODENOSCOPY  11/19/2006   SLF: Large hiatal hernia without evidence of Cameron ulcers/. Distal esophageal stricture, which allowed the gastroscope to pass without resistance.  A 16 mm Savary later passed with mild resistance/ Normal stomach.sb bx negative  . ESOPHAGOGASTRODUODENOSCOPY  10/01/2006   [id number] hiatal hernia.  Distal esophagus without evidence of   erythema, ulceration or Barrett's esophagus  . ESOPHAGOGASTRODUODENOSCOPY  2011   SLF: large hh, distal esophageal web narrowing to 73mm s/p dilation to 41mm  . ESOPHAGOGASTRODUODENOSCOPY N/A 08/06/2013   SLF: 1. Stricture at the gastroesophageal junction 2. large hiatal hernia. 3. Mild erosive gastritis.  Marland Kitchen GIVENS CAPSULE STUDY N/A 08/06/2013   INCOMPLETE-SMALL BOWLE ULCERS  . KNEE SURGERY Right   . PARTIAL HYSTERECTOMY  1978  . small bowel capsule  2008   negative  . TONSILLECTOMY AND ADENOIDECTOMY    . Two back surgeries/fusion    . UMBILICAL HERNIA REPAIR  2010    Current Outpatient Prescriptions  Medication Sig Dispense Refill  . albuterol (PROVENTIL) (2.5 MG/3ML) 0.083% nebulizer solution Take 3 mLs (2.5 mg total) by nebulization every 6 (six) hours as needed for wheezing or shortness of breath. 75 mL 12  . allopurinol (ZYLOPRIM) 100 MG tablet Take  1 tablet by mouth 2 (two) times daily.    Marland Kitchen amitriptyline (ELAVIL) 100 MG tablet Take 100 mg by mouth at bedtime.      Marland Kitchen aspirin EC 81 MG tablet Take 81 mg by mouth daily.    Marland Kitchen atorvastatin (LIPITOR) 40 MG tablet Take 1 tablet by mouth at bedtime.     . B-D ULTRA-FINE 33 LANCETS MISC USE TO CHECK BG 3 TIMES A DAY    . BIOTIN PO Take 1 capsule by mouth daily.     . cyanocobalamin (,VITAMIN B-12,) 1000 MCG/ML injection Inject 1 mL (1,000 mcg total) into the muscle every  30 (thirty) days. 1 mL 1  . esomeprazole (NEXIUM) 40 MG capsule Take 40 mg by mouth 2 (two) times daily.     . famotidine (PEPCID) 40 MG tablet TAKE 1 TABLET AT BEDTIME AS NEEDED TO CONTROL REFLUX (Patient taking differently: Take 40 mg by mouth at bedtime as needed for heartburn or indigestion. ) 90 tablet 1  . fluticasone furoate-vilanterol (BREO ELLIPTA) 100-25 MCG/INH AEPB Inhale 1 puff into the lungs daily.    . insulin lispro (HUMALOG) 100 UNIT/ML KiwkPen Use as directed [place] by sliding scale with meals. MDD=50 units    . LANTUS SOLOSTAR 100 UNIT/ML SOPN Inject 26 Units into the skin at bedtime.     Marland Kitchen linaclotide (LINZESS) 72 MCG capsule Take 1 capsule (72 mcg total) by mouth daily before breakfast. (Patient taking differently: Take 72 mcg by mouth every other day. ) 90 capsule 3  . loperamide (IMODIUM) 2 MG capsule Take 2 mg by mouth as needed for diarrhea or loose stools.    . meclizine (ANTIVERT) 25 MG tablet Take 1 tablet (25 mg total) by mouth 3 (three) times daily as needed for dizziness. 30 tablet 0  . metFORMIN (GLUCOPHAGE) 500 MG tablet Take 1,000 mg by mouth 2 (two) times daily.    . metoprolol succinate (TOPROL-XL) 25 MG 24 hr tablet Take 50 mg by mouth daily.     . ondansetron (ZOFRAN) 4 MG tablet Take 1 tablet (4 mg total) by mouth every 8 (eight) hours as needed for nausea or vomiting. 45 tablet 2  . tiotropium (SPIRIVA) 18 MCG inhalation capsule Place 18 mcg into inhaler and inhale daily.    Marland Kitchen torsemide (DEMADEX) 20 MG tablet Take 3 tablets (60 mg total) by mouth 2 (two) times daily. 180 tablet 3   No current facility-administered medications for this visit.    Allergies:  Patient has no known allergies.   Social History: The patient  reports that she quit smoking about 54 years ago. Her smoking use included Cigarettes. She has a 1.00 pack-year smoking history. She has never used smokeless tobacco. She reports that she does not drink alcohol or use drugs.   ROS:  Please see  the history of present illness. Otherwise, complete review of systems is positive for chronic dyspnea exertion.  All other systems are reviewed and negative.   Physical Exam: VS:  BP (!) 157/76   Pulse (!) 102   Ht 4\' 11"  (1.499 m)   Wt 229 lb 12.8 oz (104.2 kg)   SpO2 96%   BMI 46.41 kg/m , BMI Body mass index is 46.41 kg/m.  Wt Readings from Last 3 Encounters:  06/13/16 229 lb 12.8 oz (104.2 kg)  05/16/16 223 lb 12.8 oz (101.5 kg)  05/10/16 222 lb 9.6 oz (101 kg)    General: Overweight woman, appears comfortable at rest. HEENT: Conjunctiva and lids  normal, oropharynx clear. Neck: Supple, no elevated JVP or carotid bruits, no thyromegaly. Lungs: Clear to auscultation, nonlabored breathing at rest. Cardiac: Regular rate and rhythm, S4, no significant systolic murmur, no pericardial rub. Abdomen: Soft, nontender, bowel sounds present. Extremities: Chronic appearing leg edema with venous stasis and mild areas of weeping, distal pulses 1-2+. Skin: Warm and dry. Musculoskeletal: No kyphosis. Neuropsychiatric: Alert and oriented x3, affect grossly appropriate.  ECG: I personally reviewed the tracing from 05/08/2016 which showed sinus tachycardia with nonspecific ST abnormalities.  Recent Labwork: 05/08/2016: ALT 16; AST 15; B Natriuretic Peptide 79.0; Hemoglobin 11.1; Platelets 311 05/10/2016: BUN 67; Creatinine, Ser 1.69; Potassium 4.1; Sodium 138  November 2017: BUN 22, creatinine 1.4, potassium 5.2, sodium 141  Other Studies Reviewed Today:  Echocardiogram 05/10/2016: Study Conclusions  - Left ventricle: The cavity size was normal. Wall thickness was increased in a pattern of mild LVH. There was moderate focal basal hypertrophy of the septum. Systolic function was normal. The estimated ejection fraction was in the range of 60% to 65%. Wall motion was normal; there were no regional wall motion abnormalities. Features are consistent with a pseudonormal  left ventricular filling pattern, with concomitant abnormal relaxation and increased filling pressure (grade 2 diastolic dysfunction). - Aortic valve: Mildly calcified annulus. Trileaflet; mildly calcified leaflets. There was trivial regurgitation. Mean gradient (S): 5 mm Hg. Valve area (Vmax): 1.92 cm^2. - Mitral valve: Calcified annulus. Mildly thickened leaflets . There was mild regurgitation. - Left atrium: The atrium was moderately dilated. - Right atrium: Central venous pressure (est): 3 mm Hg. - Tricuspid valve: There was trivial regurgitation. - Pulmonary arteries: PA peak pressure: 43 mm Hg (S). - Pericardium, extracardiac: Small circumferential pericardial effusion, possibly somewhat larger collection posteriorly.  Impressions:  - Mild LVH with LVEF 60-65%. Grade 2 diastolic dysfunction with increased LV filling pressure. Moderate left atrial enlargement. MAC with mildly thickened and calcified mitral leaflets, mild mitral regurgitation. Sclerotic aortic valve with trivial aortic regurgitation. Trivial tricuspid regurgitation with PASP 43 mmHg. Small circumferential pericardial effusion, possibly somewhat larger collection posteriorly.  Assessment and Plan:  1. Chronic diastolic heart failure. Plan is to change from Lasix to Demadex at 60 mg daily. Follow-up BMET in 2 weeks.  2. Nonobstructive CAD based on previous workup. She has had no active angina symptoms. She is on aspirin, beta blocker, and statin.  3. CKD stage 3, follow-up creatinine 1.4.  4. Hyperlipidemia, continues on Lipitor.  Current medicines were reviewed with the patient today.   Orders Placed This Encounter  Procedures  . Basic Metabolic Panel (BMET)    Disposition: Follow-up in 6 weeks.  Signed, Satira Sark, MD, [facility] 06/13/2016 1:15 PM    [facility] [place], [place], [place] [address] Phone: [phone]; Fax: [phone]

## 2016-06-13 ENCOUNTER — Encounter: Payer: Self-pay | Admitting: Cardiology

## 2016-06-13 ENCOUNTER — Ambulatory Visit (INDEPENDENT_AMBULATORY_CARE_PROVIDER_SITE_OTHER): Payer: Managed Care, Other (non HMO) | Admitting: Cardiology

## 2016-06-13 VITALS — BP 157/76 | HR 102 | Ht 59.0 in | Wt 229.8 lb

## 2016-06-13 DIAGNOSIS — I5032 Chronic diastolic (congestive) heart failure: Secondary | ICD-10-CM

## 2016-06-13 DIAGNOSIS — N183 Chronic kidney disease, stage 3 unspecified: Secondary | ICD-10-CM

## 2016-06-13 DIAGNOSIS — I25810 Atherosclerosis of coronary artery bypass graft(s) without angina pectoris: Secondary | ICD-10-CM

## 2016-06-13 DIAGNOSIS — E782 Mixed hyperlipidemia: Secondary | ICD-10-CM | POA: Diagnosis not present

## 2016-06-13 MED ORDER — TORSEMIDE 20 MG PO TABS: 60.0000 mg | ORAL_TABLET | Freq: Two times a day (BID) | ORAL | 3 refills | Status: DC

## 2016-06-13 NOTE — Patient Instructions (Signed)
Your physician recommends that you schedule a follow-up appointment in: [place]   Your physician has recommended you make the following change in your medication:   STOP LASIX  START TORSEMIDE 60 MG (3 TABS) DAILY  Your physician recommends that you return for lab work in: 2 WEEKS BMP  Thank you for choosing [place]!!

## 2016-06-15 ENCOUNTER — Other Ambulatory Visit (HOSPITAL_COMMUNITY): Payer: Self-pay | Admitting: Oncology

## 2016-06-15 DIAGNOSIS — E539 Vitamin B deficiency, unspecified: Secondary | ICD-10-CM

## 2016-07-03 ENCOUNTER — Other Ambulatory Visit (HOSPITAL_COMMUNITY): Payer: PRIVATE HEALTH INSURANCE

## 2016-07-05 ENCOUNTER — Encounter (HOSPITAL_COMMUNITY): Payer: Managed Care, Other (non HMO) | Attending: Hematology & Oncology

## 2016-07-05 VITALS — BP 174/84 | HR 83 | Temp 98.0°F | Resp 20

## 2016-07-05 DIAGNOSIS — D500 Iron deficiency anemia secondary to blood loss (chronic): Secondary | ICD-10-CM | POA: Insufficient documentation

## 2016-07-05 DIAGNOSIS — Z95828 Presence of other vascular implants and grafts: Secondary | ICD-10-CM

## 2016-07-05 DIAGNOSIS — Z452 Encounter for adjustment and management of vascular access device: Secondary | ICD-10-CM | POA: Diagnosis not present

## 2016-07-05 LAB — CBC WITH DIFFERENTIAL/PLATELET
BASOS ABS: 0.1 10*3/uL (ref 0.0–0.1)
BASOS PCT: 1 %
Eosinophils Absolute: 0.5 10*3/uL (ref 0.0–0.7)
Eosinophils Relative: 6 %
HEMATOCRIT: 33.3 % — AB (ref 36.0–46.0)
HEMOGLOBIN: 10.7 g/dL — AB (ref 12.0–15.0)
LYMPHS PCT: 20 %
Lymphs Abs: 1.8 10*3/uL (ref 0.7–4.0)
MCH: 27.6 pg (ref 26.0–34.0)
MCHC: 32.1 g/dL (ref 30.0–36.0)
MCV: 85.8 fL (ref 78.0–100.0)
Monocytes Absolute: 0.8 10*3/uL (ref 0.1–1.0)
Monocytes Relative: 8 %
NEUTROS ABS: 5.9 10*3/uL (ref 1.7–7.7)
NEUTROS PCT: 65 %
Platelets: 321 10*3/uL (ref 150–400)
RBC: 3.88 MIL/uL (ref 3.87–5.11)
RDW: 14.2 % (ref 11.5–15.5)
WBC: 9.1 10*3/uL (ref 4.0–10.5)

## 2016-07-05 LAB — IRON AND TIBC
Iron: 47 ug/dL (ref 28–170)
SATURATION RATIOS: 14 % (ref 10.4–31.8)
TIBC: 326 ug/dL (ref 250–450)
UIBC: 279 ug/dL

## 2016-07-05 LAB — FERRITIN: FERRITIN: 114 ng/mL (ref 11–307)

## 2016-07-05 MED ORDER — MISC. DEVICES MISC: 0 refills | Status: DC

## 2016-07-05 MED ORDER — HEPARIN SOD (PORK) LOCK FLUSH 100 UNIT/ML IV SOLN
500.0000 [IU] | Freq: Once | INTRAVENOUS | Status: AC
  Administered 2016-07-05: 500 [IU] via INTRAVENOUS

## 2016-07-05 MED ORDER — SODIUM CHLORIDE 0.9% FLUSH
20.0000 mL | INTRAVENOUS | Status: DC | PRN
Start: 2016-07-05 — End: 2016-07-05
  Administered 2016-07-05: 20 mL via INTRAVENOUS
  Filled 2016-07-05: qty 20

## 2016-07-05 NOTE — Progress Notes (Signed)
[name] tolerated port flush lab draw well without complaints or incident. Port accessed with 20 gauge needle and blood drawn for labs as ordered. Port then flushed with 20 ml NS and 5 ml Heparin easily per protocol. VSS Pt discharged self am,bulatory using walker in satisfactory condition

## 2016-07-05 NOTE — Patient Instructions (Signed)
[place] at [facility] Discharge Instructions  RECOMMENDATIONS MADE BY THE CONSULTANT AND ANY TEST RESULTS WILL BE SENT TO YOUR REFERRING PHYSICIAN.  Portacath flushed with labs drawn today. Follow-up as scheduled. Call clinic for any questions or condners  Thank you for choosing [place] at [facility] to provide your oncology and hematology care.  To afford each patient quality time with our provider, please arrive at least 15 minutes before your scheduled appointment time.   Beginning January 23rd 2017 lab work for the [facility] will be done in the  Main lab at [facility] on 1st floor. If you have a lab appointment with the [facility] please come in thru the  Main Entrance and check in at the main information desk  You need to re-schedule your appointment should you arrive 10 or more minutes late.  We strive to give you quality time with our providers, and arriving late affects you and other patients whose appointments are after yours.  Also, if you no show three or more times for appointments you may be dismissed from the clinic at the providers discretion.     Again, thank you for choosing [facility].  Our hope is that these requests will decrease the amount of time that you wait before being seen by our physicians.       _____________________________________________________________  Should you have questions after your visit to [facility], please contact our office at [phone] between the hours of 8:30 a.m. and 4:30 p.m.  Voicemails left after 4:30 p.m. will not be returned until the following business day.  For prescription refill requests, have your pharmacy contact our office.         Resources For Cancer Patients and their Caregivers ? American Cancer Society: Can assist with transportation, wigs, general needs, runs Look Good Feel Better.        310-279-0802 ? Cancer Care: Provides  financial assistance, online support groups, medication/co-pay assistance.  1-800-813-HOPE 2256759813) ? [place] Assists [place] cancer patients and their families through emotional , educational and financial support.  2084393297 ? [place] DSS Where to apply for food stamps, Medicaid and utility assistance. 865-669-6562 ? RCATS: Transportation to medical appointments. (848) [phone] ? Social Security Administration: May apply for disability if have a Stage IV cancer. 671-706-9682 367-703-2832 ? [employer], Disability and Transit Services: Assists with nutrition, care and transit needs. [place] Support Programs: @10RELATIVEDAYS @ > Cancer Support Group  2nd Tuesday of the month 1pm-2pm, Journey Room  > Creative Journey  3rd Tuesday of the month 1130am-1pm, Journey Room  > Look Good Feel Better  1st Wednesday of the month 10am-12 noon, Journey Room (Call [place] to register [phone])

## 2016-07-31 ENCOUNTER — Ambulatory Visit (INDEPENDENT_AMBULATORY_CARE_PROVIDER_SITE_OTHER): Payer: Managed Care, Other (non HMO) | Admitting: Cardiology

## 2016-07-31 ENCOUNTER — Encounter: Payer: Self-pay | Admitting: Cardiology

## 2016-07-31 VITALS — BP 155/81 | HR 89 | Ht 59.0 in | Wt 228.0 lb

## 2016-07-31 DIAGNOSIS — I5032 Chronic diastolic (congestive) heart failure: Secondary | ICD-10-CM

## 2016-07-31 DIAGNOSIS — I25810 Atherosclerosis of coronary artery bypass graft(s) without angina pectoris: Secondary | ICD-10-CM

## 2016-07-31 NOTE — Progress Notes (Signed)
.    Cardiology Office Note  Date: 07/31/2016   ID: [name], DOB [birth year], MRN [id number]  PCP: [name], MD  Primary Cardiologist: [name], MD   Chief Complaint  Patient presents with  . Diastolic heart failure    History of Present Illness: [name] is a 70 y.o. female last seen in November. At the last visit we switched from Lasix to [facility] although she decided to switch back stating that the [facility] made her gout worse. She states that she has doubled her Lasix occasionally which seems to help with her leg swelling. Otherwise reports no progressive shortness of breath or chest pain.  I reviewed her medications which are outlined below.  Echocardiogram from October revealed LVEF 60-65% with grade 2 diastolic dysfunction and PASP 43 mmHg.  Past Medical History:  Diagnosis Date  . CAD (coronary artery disease)    Nonobstructive at cardiac catheterization 2000  . Chronic back pain   . COPD (chronic obstructive pulmonary disease) ([place])   . Degenerative disc disease   . Essential hypertension, benign   . GERD (gastroesophageal reflux disease)   . Gout   . Hiatal hernia 07/27/2013  . History of diverticulitis of colon   . History of hiatal hernia   . Iron deficiency anemia   . Irritable bowel syndrome   . Lumbar radiculopathy   . Mixed hyperlipidemia   . Neuropathy ([place])   . Ovarian cancer ([place]) 04/02/2016  . Sleep apnea   . Type 2 diabetes mellitus ([place])   . Vitamin B deficiency 12/25/2009  . Vitamin B12 deficiency     Current Outpatient Prescriptions  Medication Sig Dispense Refill  . albuterol (PROVENTIL) (2.5 MG/3ML) 0.083% nebulizer solution Take 3 mLs (2.5 mg total) by nebulization every 6 (six) hours as needed for wheezing or shortness of breath. 75 mL 12  . allopurinol (ZYLOPRIM) 100 MG tablet Take 1 tablet by mouth 2 (two) times daily.    Marland Kitchen amitriptyline (ELAVIL) 100 MG tablet Take 100 mg by mouth at bedtime.      Marland Kitchen  aspirin EC 81 MG tablet Take 81 mg by mouth daily.    Marland Kitchen atorvastatin (LIPITOR) 40 MG tablet Take 1 tablet by mouth at bedtime.     . B-D ULTRA-FINE 33 LANCETS MISC USE TO CHECK BG 3 TIMES A DAY    . BIOTIN PO Take 1 capsule by mouth daily.     . cyanocobalamin (,VITAMIN B-12,) 1000 MCG/ML injection INJECT 1 ML (1,000 MCG TOTAL) INTRAMUSCULARLY EVERY 30 DAYS 3 mL 0  . esomeprazole (NEXIUM) 40 MG capsule Take 40 mg by mouth 2 (two) times daily.     . famotidine (PEPCID) 40 MG tablet TAKE 1 TABLET AT BEDTIME AS NEEDED TO CONTROL REFLUX (Patient taking differently: Take 40 mg by mouth at bedtime as needed for heartburn or indigestion. ) 90 tablet 1  . fluticasone furoate-vilanterol (BREO ELLIPTA) 100-25 MCG/INH AEPB Inhale 1 puff into the lungs daily.    . furosemide (LASIX) 40 MG tablet Take 40 mg by mouth daily.    . insulin lispro (HUMALOG) 100 UNIT/ML KiwkPen Use as directed [place] by sliding scale with meals. MDD=50 units    . LANTUS SOLOSTAR 100 UNIT/ML SOPN Inject 26 Units into the skin at bedtime.     Marland Kitchen linaclotide (LINZESS) 72 MCG capsule Take 1 capsule (72 mcg total) by mouth daily before breakfast. (Patient taking differently: Take 72 mcg by mouth every other day. ) 90 capsule 3  .  loperamide (IMODIUM) 2 MG capsule Take 2 mg by mouth as needed for diarrhea or loose stools.    . meclizine (ANTIVERT) 25 MG tablet Take 1 tablet (25 mg total) by mouth 3 (three) times daily as needed for dizziness. 30 tablet 0  . metFORMIN (GLUCOPHAGE) 500 MG tablet Take 1,000 mg by mouth 2 (two) times daily.    . metoprolol succinate (TOPROL-XL) 25 MG 24 hr tablet Take 50 mg by mouth daily.     . Misc. Devices MISC 3cc , 25G x 1" [employer] Syringe for Vitamin B12 injections monthly 12 each 0  . ondansetron (ZOFRAN) 4 MG tablet Take 1 tablet (4 mg total) by mouth every 8 (eight) hours as needed for nausea or vomiting. 45 tablet 2  . tiotropium (SPIRIVA) 18 MCG inhalation capsule Place 18 mcg into inhaler and inhale  daily.     No current facility-administered medications for this visit.    Allergies:  Patient has no known allergies.   Social History: The patient  reports that she quit smoking about 55 years ago. Her smoking use included Cigarettes. She started smoking about 55 years ago. She has a 1.00 pack-year smoking history. She has never used smokeless tobacco. She reports that she does not drink alcohol or use drugs.   ROS:  Please see the history of present illness. Otherwise, complete review of systems is positive for intermittent gout symptoms.  All other systems are reviewed and negative.   Physical Exam: VS:  BP (!) 155/81   Pulse 89   Ht 4\' 11"  (1.499 m)   Wt 228 lb (103.4 kg)   BMI 46.05 kg/m , BMI Body mass index is 46.05 kg/m.  Wt Readings from Last 3 Encounters:  07/31/16 228 lb (103.4 kg)  06/13/16 229 lb 12.8 oz (104.2 kg)  05/16/16 223 lb 12.8 oz (101.5 kg)    General: Overweight woman,appears comfortable at rest. HEENT: Conjunctiva and lids normal, oropharynx clear. Neck: Supple, no elevated JVP or carotid bruits, no thyromegaly. Lungs: Clear to auscultation, nonlabored breathing at rest. Cardiac: Regular rate and rhythm, S4, nosignificant systolic murmur, no pericardial rub. Abdomen: Soft, nontender, bowel sounds present. Extremities: Chronic appearing leg edema with venous stasis - improved compared to last visit, distal pulses 1-2+.  ECG: I personally reviewed the tracing from 05/08/2016 which showed sinus tachycardia with nonspecific ST changes.  Recent Labwork: 05/08/2016: ALT 16; AST 15; B Natriuretic Peptide 79.0 05/10/2016: BUN 67; Creatinine, Ser 1.69; Potassium 4.1; Sodium 138 07/05/2016: Hemoglobin 10.7; Platelets 321   Other Studies Reviewed Today:  Echocardiogram 05/10/2016: Study Conclusions  - Left ventricle: The cavity size was normal. Wall thickness was   increased in a pattern of mild LVH. There was moderate focal   basal hypertrophy of the  septum. Systolic function was normal.   The estimated ejection fraction was in the range of 60% to 65%.   Wall motion was normal; there were no regional wall motion   abnormalities. Features are consistent with a pseudonormal left   ventricular filling pattern, with concomitant abnormal relaxation   and increased filling pressure (grade 2 diastolic dysfunction). - Aortic valve: Mildly calcified annulus. Trileaflet; mildly   calcified leaflets. There was trivial regurgitation. Mean   gradient (S): 5 mm Hg. Valve area (Vmax): 1.92 cm^2. - Mitral valve: Calcified annulus. Mildly thickened leaflets .   There was mild regurgitation. - Left atrium: The atrium was moderately dilated. - Right atrium: Central venous pressure (est): 3 mm Hg. - Tricuspid valve:  There was trivial regurgitation. - Pulmonary arteries: PA peak pressure: 43 mm Hg (S). - Pericardium, extracardiac: Small circumferential pericardial   effusion, possibly somewhat larger collection posteriorly.  Impressions:  - Mild LVH with LVEF 60-65%. Grade 2 diastolic dysfunction with   increased LV filling pressure. Moderate left atrial enlargement.   MAC with mildly thickened and calcified mitral leaflets, mild   mitral regurgitation. Sclerotic aortic valve with trivial aortic   regurgitation. Trivial tricuspid regurgitation with PASP 43 mmHg.   Small circumferential pericardial effusion, possibly somewhat   larger collection posteriorly.  Assessment and Plan:  1. Chronic diastolic heart failure. We will stay on Lasix 40 mg daily, she can double the dose for a few days a time as needed for control of leg edema or weight gain of 2-3 pounds in 24 hours.  2. History of nonobstructive CAD without active angina symptoms. Continue aspirin and statin therapy.  Current medicines were reviewed with the patient today.  Disposition: Follow-up in 4 months.  Signed, Satira Sark, MD, [facility] 07/31/2016 3:59 PM    [facility] at [place], [place], [place] [address] Phone: [phone]; Fax: [phone]

## 2016-07-31 NOTE — Patient Instructions (Signed)
Medication Instructions:  Continue all current medications.  Labwork: none  Testing/Procedures: none  Follow-Up: Your physician wants you to follow up in:  4 months.  You will receive a reminder letter in the mail one-two months in advance.  If you don't receive a letter, please call our office to schedule the follow up appointment   Any Other Special Instructions Will Be Listed Below (If Applicable).  If you need a refill on your cardiac medications before your next appointment, please call your pharmacy.  

## 2016-08-29 ENCOUNTER — Encounter: Payer: Self-pay | Admitting: Gastroenterology

## 2016-08-29 ENCOUNTER — Ambulatory Visit (INDEPENDENT_AMBULATORY_CARE_PROVIDER_SITE_OTHER): Payer: Managed Care, Other (non HMO) | Admitting: Gastroenterology

## 2016-08-29 DIAGNOSIS — D500 Iron deficiency anemia secondary to blood loss (chronic): Secondary | ICD-10-CM

## 2016-08-29 DIAGNOSIS — K5901 Slow transit constipation: Secondary | ICD-10-CM

## 2016-08-29 DIAGNOSIS — G43A0 Cyclical vomiting, not intractable: Secondary | ICD-10-CM

## 2016-08-29 DIAGNOSIS — K648 Other hemorrhoids: Secondary | ICD-10-CM

## 2016-08-29 DIAGNOSIS — R1310 Dysphagia, unspecified: Secondary | ICD-10-CM | POA: Diagnosis not present

## 2016-08-29 DIAGNOSIS — R1115 Cyclical vomiting syndrome unrelated to migraine: Secondary | ICD-10-CM

## 2016-08-29 DIAGNOSIS — R1319 Other dysphagia: Secondary | ICD-10-CM

## 2016-08-29 NOTE — Assessment & Plan Note (Signed)
LAST Hb 10.11 Jul 2016. APPT WITH HEMATOLOGY TOMORROW. ONE EPISODE OF GI BLEED IN OCT 2017.  CONTINUE TO MONITOR SYMPTOMS.

## 2016-08-29 NOTE — Patient Instructions (Addendum)
DRINK WATER TO KEEP YOUR URINE LIGHT YELLOW.  FOLLOW A HIGH FIBER DIET.  AVOID ITEMS THAT CAUSE BLOATING & GAS.  USE PREPARATION H FOUR TIMES  A DAY IF NEEDED TO RELIEVE RECTAL PAIN/PRESSURE/BLEEDING.   CONTINUE NEXIUM. TAKE 30 MINUTES BEFORE MEALS ONCE OR TWICE DAILY TO PREVENT REFLUX.  PEPCID IF NEEDED TO CONTROL REFLUX.   CONTINUE B12 INJECTIONS AND GET IRON INFUSIONS IF NEEDED.  PLEASE CALL WITH QUESTIONS OR CONCERNS.  FOLLOW UP IN 6 MOS.

## 2016-08-29 NOTE — Progress Notes (Signed)
Subjective:    Patient ID: [name], female    DOB: 02-26-1946, 71 y.o.   MRN: [id number]  TAPPER,DAVID B, MD  HPI PLANNING TO GO TO [place] AND [place].  MAY BE GOING TO [place]. RARE PROBLEMS:  SOLIDS, LIQUIDS, & PILLS. SOB DUE TO COPD. SYMPTOMS HAVE BEEN MORE DIFFICULT TO CONTROL. LAST STEROIDS: NOV AND DEC 2017. SAW A LITTLE BLACK TARRY STOOL IN OCT 2017 AND FRESH BLOOD(3 DAYS-17-19) IN [place] AS WELL). SINCE THEN STOOLS BACK TO NORMAL. LAST HB DEC 2017 Hb 10.7 MCV 85.8 PLT 321. BACK ON B12 SHOTS. HAS APPT WITH HEMATOLOGY TOMORROW.  PT DENIES FEVER, CHILLS, HEMATEMESIS, nausea, vomiting, CHEST PAIN, CHANGE IN BOWEL IN HABITS, constipation, abdominal pain, OR heartburn or indigestion.  Past Medical History:  Diagnosis Date  . CAD (coronary artery disease)    Nonobstructive at cardiac catheterization 2000  . Chronic back pain   . COPD (chronic obstructive pulmonary disease) ([place])   . Degenerative disc disease   . Essential hypertension, benign   . GERD (gastroesophageal reflux disease)   . Gout   . Hiatal hernia 07/27/2013  . History of diverticulitis of colon   . History of hiatal hernia   . Iron deficiency anemia   . Irritable bowel syndrome   . Lumbar radiculopathy   . Mixed hyperlipidemia   . Neuropathy ([place])   . Ovarian cancer ([place]) 04/02/2016  . Sleep apnea   . Type 2 diabetes mellitus ([place])   . Vitamin B deficiency 12/25/2009  . Vitamin B12 deficiency     Past Surgical History:  Procedure Laterality Date  . ABDOMINAL HYSTERECTOMY    . BACK SURGERY    . Benign breast cysts    . CHOLECYSTECTOMY    . COLONOSCOPY  10/01/2006   SLF:Pan colonic diverticulosis and moderate internal hemorrhoids/ Otherwise no polyps, masses, inflammatory changes or AVMs/  . COLONOSCOPY  2011   SLF: pancolonic diverticulosis, large internal hemorrhoids  . COLONOSCOPY N/A 01/26/2016   Procedure: COLONOSCOPY;  Surgeon: [name], MD;  Location: [facility];  Service: Endoscopy;  Laterality: N/A;  830   . ESOPHAGOGASTRODUODENOSCOPY  11/19/2006   SLF: Large hiatal hernia without evidence of [place] ulcers/. Distal esophageal stricture, which allowed the gastroscope to pass without resistance.  A 16 mm Sav[place] later passed with mild resistance/ Normal stomach.sb bx negative  . ESOPHAGOGASTRODUODENOSCOPY  10/01/2006   JOI:NOMVE hiatal hernia.  Distal esophagus without evidence of   erythema, ulceration or Barrett's esophagus  . ESOPHAGOGASTRODUODENOSCOPY  2011   SLF: large hh, distal esophageal web narrowing to 58mm s/p dilation to 53mm  . ESOPHAGOGASTRODUODENOSCOPY N/A 08/06/2013   SLF: 1. Stricture at the gastroesophageal junction 2. large hiatal hernia. 3. Mild erosive gastritis.  Marland Kitchen GIVENS CAPSULE STUDY N/A 08/06/2013   INCOMPLETE-SMALL BOWLE ULCERS  . KNEE SURGERY Right   . PARTIAL HYSTERECTOMY  1978  . small bowel capsule  2008   negative  . TONSILLECTOMY AND ADENOIDECTOMY    . Two back surgeries/fusion    . UMBILICAL HERNIA REPAIR  2010   No Known Allergies  Current Outpatient Prescriptions  Medication Sig Dispense Refill  . albuterol (PROVENTIL) (2.5 MG/3ML) 0.083% nebulizer solution Take 3 mLs (2.5 mg total) by nebulization every 6 (six) hours as needed for wheezing or shortness of breath.    . allopurinol (ZYLOPRIM) 100 MG tablet Take 1 tablet by mouth 2 (two) times daily.    Marland Kitchen amitriptyline (ELAVIL) 100 MG tablet Take 100  mg by mouth at bedtime.      Marland Kitchen aspirin EC 81 MG tablet Take 81 mg by mouth daily.    Marland Kitchen atorvastatin (LIPITOR) 40 MG tablet Take 1 tablet by mouth at bedtime.     . B-D ULTRA-FINE 33 LANCETS MISC USE TO CHECK BG 3 TIMES A DAY    . BIOTIN PO Take 1 capsule by mouth daily.     . cyanocobalamin (,VITAMIN B-12,) 1000 MCG/ML injection INJECT 1 ML (1,000 MCG TOTAL) INTRAMUSCULARLY EVERY 30 DAYS    . esomeprazole (NEXIUM) 40 MG capsule Take 40 mg by mouth 2 (two) times daily.  MOST DAYS   . famotidine (PEPCID) 40 MG  tablet TAKE 1 QHS IF AS NEEDED TO CONTROL REFLUX(    . fluticasone furoate-vilanterol (BREO ELLIPTA) 100-25 MCG/INH AEPB Inhale 1 puff into the lungs daily.    . furosemide (LASIX) 40 MG tablet Take 40 mg by mouth daily.    . insulin lispro (HUMALOG) 100 UNIT/ML KiwkPen Use as directed [place] by sliding scale with meals. MDD=50 units    . LANTUS SOLOSTAR 100 UNIT/ML SOPN Inject 26 Units into the skin at bedtime.     Marland Kitchen linaclotide (LINZESS) 72 MCG capsule Take 72 mcg by mouth every other day.     . loperamide (IMODIUM) 2 MG capsule Take 2 mg by mouth as needed for diarrhea or loose stools. RARE   . Meclizine 25 MG tablet Take 1 TID IF needed for dizziness.    . metFORMIN  500 MG tablet Take 1,000 mg by mouth 2 (two) times daily.    . metoprolol 25 MG 24 hr tablet Take 50 mg by mouth daily.     .      . ondansetron  4 MG tablet Take 1 Q8H IF needed for nausea or vomiting. RARE   . tiotropium (SPIRIVA)  capsule Place 18 mcg into inhaler and inhale daily.     Review of Systems PER HPI OTHERWISE ALL SYSTEMS ARE NEGATIVE.    Objective:   Physical Exam  Constitutional: She is oriented to person, place, and time. She appears well-developed and well-nourished. No distress.  HENT:  Head: Normocephalic and atraumatic.  Mouth/Throat: Oropharynx is clear and moist. No oropharyngeal exudate.  Eyes: Pupils are equal, round, and reactive to light. No scleral icterus.  Neck: Normal range of motion. Neck supple.  Cardiovascular: Normal rate, regular rhythm and normal heart sounds.   Pulmonary/Chest: Effort normal and breath sounds normal. No respiratory distress.  Abdominal: Soft. Bowel sounds are normal. She exhibits no distension. There is no tenderness.  Musculoskeletal: She exhibits edema (TRACE BILATERAL LOWER EXTERMITIES).  Lymphadenopathy:    She has no cervical adenopathy.  Neurological: She is alert and oriented to person, place, and time.  NO FOCAL DEFICITS  Psychiatric: She has a normal mood  and affect.  Vitals reviewed.         Assessment & Plan:

## 2016-08-29 NOTE — Assessment & Plan Note (Signed)
SYMPTOMS CONTROLLED/RESOLVED.  CONTINUE TO MONITOR SYMPTOMS. 

## 2016-08-29 NOTE — Assessment & Plan Note (Signed)
SYMPTOMS FAIRLY WELL CONTROLLED.  DRINK WATER  EAT FIBER USE PREPARATION H FOUR TIMES  A DAY IF NEEDED TO RELIEVE RECTAL PAIN/PRESSURE/BLEEDING. FOLLOW UP IN 6 MOS.

## 2016-08-29 NOTE — Assessment & Plan Note (Signed)
SYMPTOMS FAIRLY WELL CONTROLLED.  CONTINUE TO MONITOR SYMPTOMS. 

## 2016-08-29 NOTE — Progress Notes (Signed)
ON RECALL  °

## 2016-08-29 NOTE — Progress Notes (Signed)
cc'ed to pcp °

## 2016-08-29 NOTE — Assessment & Plan Note (Signed)
SYMPTOMS FAIRLY WELL CONTROLLED.  CONTINUE TO MONITOR SYMPTOMS. CONTINUE LINZESS. FOLLOW UP IN 6 MOS.  

## 2016-08-30 ENCOUNTER — Encounter (HOSPITAL_COMMUNITY): Payer: Managed Care, Other (non HMO) | Attending: Hematology & Oncology

## 2016-08-30 VITALS — BP 161/58 | HR 96 | Temp 98.1°F | Resp 18

## 2016-08-30 DIAGNOSIS — Z452 Encounter for adjustment and management of vascular access device: Secondary | ICD-10-CM | POA: Diagnosis not present

## 2016-08-30 DIAGNOSIS — D500 Iron deficiency anemia secondary to blood loss (chronic): Secondary | ICD-10-CM | POA: Diagnosis not present

## 2016-08-30 DIAGNOSIS — Z95828 Presence of other vascular implants and grafts: Secondary | ICD-10-CM

## 2016-08-30 MED ORDER — HEPARIN SOD (PORK) LOCK FLUSH 100 UNIT/ML IV SOLN
500.0000 [IU] | Freq: Once | INTRAVENOUS | Status: AC
  Administered 2016-08-30: 500 [IU] via INTRAVENOUS
  Filled 2016-08-30: qty 5

## 2016-08-30 MED ORDER — SODIUM CHLORIDE 0.9% FLUSH
10.0000 mL | Freq: Once | INTRAVENOUS | Status: AC
  Administered 2016-08-30: 10 mL via INTRAVENOUS

## 2016-08-30 NOTE — Patient Instructions (Signed)
[facility] at [facility] Discharge Instructions  RECOMMENDATIONS MADE BY THE CONSULTANT AND ANY TEST RESULTS WILL BE SENT TO YOUR REFERRING PHYSICIAN.  Port flush today as scheduled. Return as scheduled.  Thank you for choosing [facility] at [facility] to provide your oncology and hematology care.  To afford each patient quality time with our provider, please arrive at least 15 minutes before your scheduled appointment time.    If you have a lab appointment with the Cancer Center please come in thru the  Main Entrance and check in at the main information desk  You need to re-schedule your appointment should you arrive 10 or more minutes late.  We strive to give you quality time with our providers, and arriving late affects you and other patients whose appointments are after yours.  Also, if you no show three or more times for appointments you may be dismissed from the clinic at the providers discretion.     Again, thank you for choosing [facility].  Our hope is that these requests will decrease the amount of time that you wait before being seen by our physicians.       _____________________________________________________________  Should you have questions after your visit to  Cancer Center, please contact our office at [phone] between the hours of 8:30 a.m. and 4:30 p.m.  Voicemails left after 4:30 p.m. will not be returned until the following business day.  For prescription refill requests, have your pharmacy contact our office.       Resources For Cancer Patients and their Caregivers ? American Cancer Society: Can assist with transportation, wigs, general needs, runs Look Good Feel Better.        1-888-227-6333 ? Cancer Care: Provides financial assistance, online support groups, medication/co-pay assistance.  1-800-813-HOPE (4673) ? [facility] Assists [place] cancer patients  and their families through emotional , educational and financial support.  336-427-4357 ? [place] DSS Where to apply for food stamps, Medicaid and utility assistance. 336-342-1394 ? RCATS: Transportation to medical appointments. 336-347-2287 ? Social Security Administration: May apply for disability if have a Stage IV cancer. 336-342-7796 1-800-772-1213 ? [place] Aging, Disability and Transit Services: Assists with nutrition, care and transit needs. 336-349-2343  Cancer Center Support Programs: @10RELATIVEDAYS@ > Cancer Support Group  2nd Tuesday of the month 1pm-2pm, Journey Room  > Creative Journey  3rd Tuesday of the month 1130am-1pm, Journey Room  > Look Good Feel Better  1st Wednesday of the month 10am-12 noon, Journey Room (Call American Cancer [employer] to register [phone])   

## 2016-08-30 NOTE — Progress Notes (Signed)
[name] presented for Portacath access and flush. Proper placement of portacath confirmed by CXR. Portacath located right chest wall accessed with  H 20 needle. Good blood return present. Portacath flushed with 34ml NS and 500U/64ml Heparin and needle removed intact. Procedure without incident. Patient tolerated procedure well.

## 2016-09-10 ENCOUNTER — Other Ambulatory Visit (HOSPITAL_COMMUNITY): Payer: Self-pay | Admitting: Oncology

## 2016-09-10 DIAGNOSIS — E539 Vitamin B deficiency, unspecified: Secondary | ICD-10-CM

## 2016-10-02 ENCOUNTER — Ambulatory Visit (HOSPITAL_COMMUNITY): Payer: PRIVATE HEALTH INSURANCE

## 2016-10-22 ENCOUNTER — Other Ambulatory Visit: Payer: Self-pay | Admitting: Gastroenterology

## 2016-10-25 ENCOUNTER — Encounter (HOSPITAL_COMMUNITY): Payer: Managed Care, Other (non HMO) | Attending: Hematology & Oncology

## 2016-10-25 ENCOUNTER — Encounter (HOSPITAL_BASED_OUTPATIENT_CLINIC_OR_DEPARTMENT_OTHER): Payer: Managed Care, Other (non HMO) | Admitting: Adult Health

## 2016-10-25 ENCOUNTER — Encounter (HOSPITAL_COMMUNITY): Payer: Managed Care, Other (non HMO)

## 2016-10-25 ENCOUNTER — Ambulatory Visit (HOSPITAL_COMMUNITY): Payer: Managed Care, Other (non HMO)

## 2016-10-25 ENCOUNTER — Encounter (HOSPITAL_COMMUNITY): Payer: Self-pay | Admitting: Adult Health

## 2016-10-25 VITALS — BP 150/89 | HR 76 | Temp 98.0°F | Resp 24 | Wt 232.9 lb

## 2016-10-25 DIAGNOSIS — Z452 Encounter for adjustment and management of vascular access device: Secondary | ICD-10-CM | POA: Diagnosis present

## 2016-10-25 DIAGNOSIS — D509 Iron deficiency anemia, unspecified: Secondary | ICD-10-CM

## 2016-10-25 DIAGNOSIS — D500 Iron deficiency anemia secondary to blood loss (chronic): Secondary | ICD-10-CM | POA: Diagnosis not present

## 2016-10-25 DIAGNOSIS — E539 Vitamin B deficiency, unspecified: Secondary | ICD-10-CM

## 2016-10-25 DIAGNOSIS — R5383 Other fatigue: Secondary | ICD-10-CM | POA: Diagnosis not present

## 2016-10-25 LAB — COMPREHENSIVE METABOLIC PANEL
ALK PHOS: 83 U/L (ref 38–126)
ALT: 12 U/L — ABNORMAL LOW (ref 14–54)
ANION GAP: 6 (ref 5–15)
AST: 16 U/L (ref 15–41)
Albumin: 3.6 g/dL (ref 3.5–5.0)
BILIRUBIN TOTAL: 0.6 mg/dL (ref 0.3–1.2)
BUN: 39 mg/dL — ABNORMAL HIGH (ref 6–20)
CALCIUM: 8.3 mg/dL — AB (ref 8.9–10.3)
CO2: 28 mmol/L (ref 22–32)
Chloride: 110 mmol/L (ref 101–111)
Creatinine, Ser: 1.86 mg/dL — ABNORMAL HIGH (ref 0.44–1.00)
GFR calc non Af Amer: 26 mL/min — ABNORMAL LOW (ref >60)
GFR, EST AFRICAN AMERICAN: 30 mL/min — AB (ref >60)
GLUCOSE: 53 mg/dL — AB (ref 65–99)
Potassium: 4.7 mmol/L (ref 3.5–5.1)
Sodium: 144 mmol/L (ref 135–145)
TOTAL PROTEIN: 7.3 g/dL (ref 6.5–8.1)

## 2016-10-25 LAB — CBC WITH DIFFERENTIAL/PLATELET
BASOS ABS: 0.1 10*3/uL (ref 0.0–0.1)
Basophils Relative: 1 %
Eosinophils Absolute: 0.3 10*3/uL (ref 0.0–0.7)
Eosinophils Relative: 3 %
HEMATOCRIT: 33.6 % — AB (ref 36.0–46.0)
Hemoglobin: 10.7 g/dL — ABNORMAL LOW (ref 12.0–15.0)
LYMPHS PCT: 25 %
Lymphs Abs: 2.9 10*3/uL (ref 0.7–4.0)
MCH: 27.1 pg (ref 26.0–34.0)
MCHC: 31.8 g/dL (ref 30.0–36.0)
MCV: 85.1 fL (ref 78.0–100.0)
MONO ABS: 1 10*3/uL (ref 0.1–1.0)
Monocytes Relative: 8 %
NEUTROS ABS: 7.3 10*3/uL (ref 1.7–7.7)
Neutrophils Relative %: 63 %
Platelets: 335 10*3/uL (ref 150–400)
RBC: 3.95 MIL/uL (ref 3.87–5.11)
RDW: 14.2 % (ref 11.5–15.5)
WBC: 11.5 10*3/uL — AB (ref 4.0–10.5)

## 2016-10-25 LAB — IRON AND TIBC
Iron: 40 ug/dL (ref 28–170)
Saturation Ratios: 12 % (ref 10.4–31.8)
TIBC: 337 ug/dL (ref 250–450)
UIBC: 297 ug/dL

## 2016-10-25 LAB — FOLATE: Folate: 8.4 ng/mL (ref >5.9)

## 2016-10-25 LAB — FERRITIN: Ferritin: 40 ng/mL (ref 11–307)

## 2016-10-25 LAB — VITAMIN B12: VITAMIN B 12: 350 pg/mL (ref 180–914)

## 2016-10-25 MED ORDER — SODIUM CHLORIDE 0.9% FLUSH
10.0000 mL | INTRAVENOUS | Status: DC | PRN
  Administered 2016-10-25: 10 mL via INTRAVENOUS
  Filled 2016-10-25: qty 10

## 2016-10-25 MED ORDER — HEPARIN SOD (PORK) LOCK FLUSH 100 UNIT/ML IV SOLN
500.0000 [IU] | Freq: Once | INTRAVENOUS | Status: AC
  Administered 2016-10-25: 500 [IU] via INTRAVENOUS

## 2016-10-25 NOTE — Progress Notes (Signed)
[place], [place] [address]   CLINIC:  Medical Oncology/Hematology  PCP:  [name], MD [place] [address] [phone]   REASON FOR VISIT:  Follow-up for Iron deficiency anemia AND vitamin B12 deficiency  CURRENT THERAPY: IV iron prn AND Monthly B12 injections (administered at home)     HISTORY OF PRESENT ILLNESS:  (From Kirby Crigler, PA-C's last note on 04/02/16)     INTERVAL HISTORY:  [name] 71 y.o. female presents for routine follow-up for iron deficiency anemia and vitamin B12 deficiency. She has not required IV iron since transferring her care to [facility].    She is here today with her husband.  She has several concerns, which are largely chronic and managed by several specialists.  Her energy levels are "not good at all; I could fall asleep right now if I tried." She doesn't sleep well, which she attributes to frequent urination at night.  She also has sleep apnea, but reports wearing her CPAP consistently each night.    She denies any recent blood in her stools/melena. Last seen by Dr. Oneida Alar with GI on 08/29/16; does report 1 episode of GIB in 05/2016, which resolved and did not recur; will return to see GI in 6 months. Last colonoscopy 01/26/16; colonic polyps removed without evidence of dysplasia or malignancy.    She continues her B12 injections at home.  She has not seen a big change in energy levels since starting the injections.       REVIEW OF SYSTEMS:  Review of Systems  Constitutional: Positive for fatigue. Negative for chills and fever.  HENT:   Positive for trouble swallowing (chronic).   Eyes: Negative.   Respiratory: Positive for cough (chronic (COPD)) and shortness of breath (chronic (COPD)).        + congestion (COPD)  Cardiovascular: Positive for leg swelling (chronic ).  Gastrointestinal: Negative for abdominal pain, blood in stool, constipation, diarrhea, nausea and  vomiting.       + heartburn (chronic)   Endocrine: Negative.   Genitourinary: Positive for difficulty urinating ("trouble emptying bladder"; chronic issue ). Negative for dysuria, hematuria and vaginal bleeding.   Musculoskeletal: Positive for arthralgias and back pain.       (L) leg and hip pain; back pain (chronic)  Neurological:       + peripheral neuropathy   Hematological: Negative.   Psychiatric/Behavioral: Positive for sleep disturbance (secondary to nocturia; does use CPAP consistently every night).     PAST MEDICAL/SURGICAL HISTORY:  Past Medical History:  Diagnosis Date  . CAD (coronary artery disease)    Nonobstructive at cardiac catheterization 2000  . Chronic back pain   . COPD (chronic obstructive pulmonary disease) ([place])   . Degenerative disc disease   . Essential hypertension, benign   . GERD (gastroesophageal reflux disease)   . Gout   . Hiatal hernia 07/27/2013  . History of diverticulitis of colon   . History of hiatal hernia   . Iron deficiency anemia   . Irritable bowel syndrome   . Lumbar radiculopathy   . Mixed hyperlipidemia   . Neuropathy ([place])   . Ovarian cancer ([place]) 04/02/2016  . Sleep apnea   . Type 2 diabetes mellitus ([place])   . Vitamin B deficiency 12/25/2009  . Vitamin B12 deficiency    Past Surgical History:  Procedure Laterality Date  . ABDOMINAL HYSTERECTOMY    . BACK SURGERY    .  Benign breast cysts    . CHOLECYSTECTOMY    . COLONOSCOPY  10/01/2006   SLF:Pan colonic diverticulosis and moderate internal hemorrhoids/ Otherwise no polyps, masses, inflammatory changes or AVMs/  . COLONOSCOPY  2011   SLF: pancolonic diverticulosis, large internal hemorrhoids  . COLONOSCOPY N/A 01/26/2016   Procedure: COLONOSCOPY;  Surgeon: [name], MD;  Location: [facility];  Service: Endoscopy;  Laterality: N/A;  830   . ESOPHAGOGASTRODUODENOSCOPY  11/19/2006   SLF: Large hiatal hernia without evidence of Cameron ulcers/. Distal esophageal  stricture, which allowed the gastroscope to pass without resistance.  A 16 mm Savary later passed with mild resistance/ Normal stomach.sb bx negative  . ESOPHAGOGASTRODUODENOSCOPY  10/01/2006   ZOX:WRUEA hiatal hernia.  Distal esophagus without evidence of   erythema, ulceration or Barrett's esophagus  . ESOPHAGOGASTRODUODENOSCOPY  2011   SLF: large hh, distal esophageal web narrowing to 50mm s/p dilation to 8mm  . ESOPHAGOGASTRODUODENOSCOPY N/A 08/06/2013   SLF: 1. Stricture at the gastroesophageal junction 2. large hiatal hernia. 3. Mild erosive gastritis.  Marland Kitchen GIVENS CAPSULE STUDY N/A 08/06/2013   INCOMPLETE-SMALL BOWLE ULCERS  . KNEE SURGERY Right   . PARTIAL HYSTERECTOMY  1978  . small bowel capsule  2008   negative  . TONSILLECTOMY AND ADENOIDECTOMY    . Two back surgeries/fusion    . UMBILICAL HERNIA REPAIR  2010     SOCIAL HISTORY:  Social History   Social History  . Marital status: Married    Spouse name: N/A  . Number of children: 4  . Years of education: N/A   Occupational History  . Not on file.   Social History Main Topics  . Smoking status: Former Smoker    Packs/day: 1.00    Years: 1.00    Types: Cigarettes    Start date: 02/19/1961    Quit date: 08/05/1961  . Smokeless tobacco: Never Used     Comment: Quit smoking x 50 years  . Alcohol use No  . Drug use: No  . Sexual activity: Not on file   Other Topics Concern  . Not on file   Social History Narrative   HAS 4 SON-GRAND [place].    FAMILY HISTORY:  Family History  Problem Relation Age of Onset  . Colon cancer Brother     diagnosed age 25. Living.   Marland Kitchen Ulcers Sister   . Diabetes Sister   . Heart attack Sister   . Kidney failure Sister   . Stroke Sister   . Ulcers Mother   . Diabetes Mother   . Heart attack Mother   . Stroke Mother   . Asthma Mother   . Heart attack Brother   . Asthma Sister   . Diabetes Brother   . Stroke Maternal Grandmother   . Heart attack Maternal  Grandmother   . Heart attack Other     CURRENT MEDICATIONS:  Outpatient Encounter Prescriptions as of 10/25/2016  Medication Sig Note  . albuterol (PROVENTIL) (2.5 MG/3ML) 0.083% nebulizer solution Take 3 mLs (2.5 mg total) by nebulization every 6 (six) hours as needed for wheezing or shortness of breath.   . allopurinol (ZYLOPRIM) 100 MG tablet Take 1 tablet by mouth 2 (two) times daily.   Marland Kitchen amitriptyline (ELAVIL) 100 MG tablet Take 100 mg by mouth at bedtime.     Marland Kitchen aspirin EC 81 MG tablet Take 81 mg by mouth daily.   Marland Kitchen atorvastatin (LIPITOR) 40 MG tablet Take 1 tablet by  mouth at bedtime.    . B-D ULTRA-FINE 33 LANCETS MISC USE TO CHECK BG 3 TIMES A DAY   . BIOTIN PO Take 1 capsule by mouth daily.    . cyanocobalamin (,VITAMIN B-12,) 1000 MCG/ML injection INJECT 1 ML INTRAMUSCULARLY EVERY 30 DAYS (DISCARD AFTER 28 DAYS)   . esomeprazole (NEXIUM) 40 MG capsule TAKE 1 CAPSULE TWICE A DAY 30 MINUTES PRIOR TO MEALS   . famotidine (PEPCID) 40 MG tablet TAKE 1 TABLET AT BEDTIME AS NEEDED TO CONTROL REFLUX (Patient taking differently: Take 40 mg by mouth at bedtime as needed for heartburn or indigestion. )   . fluticasone furoate-vilanterol (BREO ELLIPTA) 100-25 MCG/INH AEPB Inhale 1 puff into the lungs daily.   . furosemide (LASIX) 40 MG tablet Take 40 mg by mouth daily.   . insulin lispro (HUMALOG) 100 UNIT/ML KiwkPen Use as directed [place] by sliding scale with meals. MDD=50 units   . LANTUS SOLOSTAR 100 UNIT/ML SOPN Inject 26 Units into the skin at bedtime.    Marland Kitchen linaclotide (LINZESS) 72 MCG capsule Take 1 capsule (72 mcg total) by mouth daily before breakfast. (Patient taking differently: Take 72 mcg by mouth every other day. ) 05/16/2016: Dissolves one tab every other day  . loperamide (IMODIUM) 2 MG capsule Take 2 mg by mouth as needed for diarrhea or loose stools.   . meclizine (ANTIVERT) 25 MG tablet Take 1 tablet (25 mg total) by mouth 3 (three) times daily as needed for dizziness.   .  metFORMIN (GLUCOPHAGE) 500 MG tablet Take 1,000 mg by mouth 2 (two) times daily.   . metoprolol succinate (TOPROL-XL) 25 MG 24 hr tablet Take 50 mg by mouth daily.    . Misc. Devices MISC 3cc , 25G x 1" VanishPoint Syringe for Vitamin B12 injections monthly   . ondansetron (ZOFRAN) 4 MG tablet Take 1 tablet (4 mg total) by mouth every 8 (eight) hours as needed for nausea or vomiting.   . tiotropium (SPIRIVA) 18 MCG inhalation capsule Place 18 mcg into inhaler and inhale daily.    No facility-administered encounter medications on file as of 10/25/2016.     ALLERGIES:  No Known Allergies   PHYSICAL EXAM:  ECOG Performance status: 1-2 - Symptomatic; requires occasional assistance.   Vitals:   10/25/16 1047  BP: (!) 150/89  Pulse: 76  Resp: (!) 24  Temp: 98 F (36.7 C)   *Tachypnea resolved after period of rest after walking to exam room.   Filed Weights   10/25/16 1047  Weight: 232 lb 14.4 oz (105.6 kg)    Physical Exam  Constitutional: She is oriented to person, place, and time and well-developed, well-nourished, and in no distress.  HENT:  Head: Normocephalic.  Mouth/Throat: Oropharynx is clear and moist. No oropharyngeal exudate.  Eyes: Conjunctivae are normal. Pupils are equal, round, and reactive to light. No scleral icterus.  Neck: Normal range of motion. Neck supple.  Cardiovascular: Normal rate, regular rhythm and normal heart sounds.   Pulmonary/Chest: Effort normal.  Mild rhonchi with expiratory wheeze to RUL; diminished bases bilaterally. Otherwise, clear to auscultation.   Abdominal: Soft. Bowel sounds are normal. There is no tenderness. There is no rebound and no guarding.  Musculoskeletal: She exhibits edema (2+ BLE pitting edema ).  Lymphadenopathy:    She has no cervical adenopathy.  Neurological: She is alert and oriented to person, place, and time. No cranial nerve deficit.  Skin: Skin is warm and dry.  BLE edema and erythema; no open  ulceration, but  evidence of likely venous stasis.   Psychiatric: Mood, memory, affect and judgment normal.     LABORATORY DATA:  I have reviewed the labs as listed.  CBC    Component Value Date/Time   WBC 9.1 07/05/2016 1025   RBC 3.88 07/05/2016 1025   HGB 10.7 (L) 07/05/2016 1025   HCT 33.3 (L) 07/05/2016 1025   PLT 321 07/05/2016 1025   MCV 85.8 07/05/2016 1025   MCH 27.6 07/05/2016 1025   MCHC 32.1 07/05/2016 1025   RDW 14.2 07/05/2016 1025   LYMPHSABS 1.8 07/05/2016 1025   MONOABS 0.8 07/05/2016 1025   EOSABS 0.5 07/05/2016 1025   BASOSABS 0.1 07/05/2016 1025   CMP Latest Ref Rng & Units 05/10/2016 05/09/2016 05/08/2016  Glucose 65 - 99 mg/dL 195(H) 234(H) 120(H)  BUN 6 - 20 mg/dL 67(H) 45(H) 30(H)  Creatinine 0.44 - 1.00 mg/dL 1.69(H) 1.47(H) 1.27(H)  Sodium 135 - 145 mmol/L 138 136 138  Potassium 3.5 - 5.1 mmol/L 4.1 4.5 4.0  Chloride 101 - 111 mmol/L 96(L) 96(L) 105  CO2 22 - 32 mmol/L 30 30 27   Calcium 8.9 - 10.3 mg/dL 7.5(L) 8.0(L) 7.9(L)  Total Protein 6.5 - 8.1 g/dL - - 7.3  Total Bilirubin 0.3 - 1.2 mg/dL - - 0.5  Alkaline Phos 38 - 126 U/L - - 96  AST 15 - 41 U/L - - 15  ALT 14 - 54 U/L - - 16   Results for CLARIE, CAMEY (MRN [id number])   Ref. Range 10/25/2016 11:45  Iron Latest Ref Range: 28 - 170 ug/dL 40  UIBC Latest Units: ug/dL 297  TIBC Latest Ref Range: 250 - 450 ug/dL 337  Saturation Ratios Latest Ref Range: 10.4 - 31.8 % 12  Ferritin Latest Ref Range: 11 - 307 ng/mL 40   PENDING LABS:    DIAGNOSTIC IMAGING:    PATHOLOGY:  Colonoscopy path: 01/26/16     ASSESSMENT & PLAN:   Iron deficiency anemia:  -Noted to have 1 episode of GI bleeding in 05/2016; this resolved and no recurrent symptoms. No evidence of active bleeding at present.  Fatigue remains and has been chronic for her.  -Iron studies resulted after patient had left clinic today. Ferritin low at 40; saturation ratios are normal. Hemoglobin low at 10.7 g/dL. We will make arrangements for  her to return to cancer center in 1-2 weeks for 1 dose of IV Feraheme.  -Return to cancer center in 6 months for continued follow-up.   Vitamin B12 deficiency:  -Continue monthly B12 injections (administered at home).   Port-a-cath maintenance:  -Continue port flushes every 2 months per protocol.   Health maintenance/Wellness promotion:  -Last colonoscopy is up-to-date; was completed in 01/2016. She gets her mammograms done annually in [place]; last mammo was 04/2016 per patient.  She has had annual flu vaccine, as well as pneumonia vaccines. She has not had shingles vaccine and reportedly never had chicken pox as a child. Recommended she talk to PCP to see if it would be appropriate for her to get shingles vaccination.  -Recommended healthy diet and exercise. Unable to exercise much d/t arthralgias.      Dispo:  -Return to cancer center in 6 months with labs.    All questions were answered to patient's stated satisfaction. Encouraged patient to call with any new concerns or questions before her next visit to the cancer center and we can certain see her sooner, if needed.      Orders  placed this encounter:  Orders Placed This Encounter  Procedures  . CBC with Differential/Platelet  . Vitamin B12  . Folate  . Iron and TIBC  . Ferritin  . Comprehensive metabolic panel      Mike Craze, NP [place] [phone]

## 2016-10-25 NOTE — Progress Notes (Signed)
[name] presented for Portacath access and flush. Portacath located right chest wall accessed with  H 20 needle. Good blood return present. Portacath flushed with 43ml NS and 500U/41ml Heparin and needle removed intact. Procedure without incident. Patient tolerated procedure well.  Labs drawn per orders.

## 2016-10-25 NOTE — Patient Instructions (Signed)
[place] at [facility] Discharge Instructions  RECOMMENDATIONS MADE BY THE CONSULTANT AND ANY TEST RESULTS WILL BE SENT TO YOUR REFERRING PHYSICIAN.  Port flush with labs done Follow up as scheduled.  Thank you for choosing [place] at [facility] to provide your oncology and hematology care.  To afford each patient quality time with our provider, please arrive at least 15 minutes before your scheduled appointment time.    If you have a lab appointment with the [facility] please come in thru the  Main Entrance and check in at the main information desk  You need to re-schedule your appointment should you arrive 10 or more minutes late.  We strive to give you quality time with our providers, and arriving late affects you and other patients whose appointments are after yours.  Also, if you no show three or more times for appointments you may be dismissed from the clinic at the providers discretion.     Again, thank you for choosing [facility].  Our hope is that these requests will decrease the amount of time that you wait before being seen by our physicians.       _____________________________________________________________  Should you have questions after your visit to [facility], please contact our office at [phone] between the hours of 8:30 a.m. and 4:30 p.m.  Voicemails left after 4:30 p.m. will not be returned until the following business day.  For prescription refill requests, have your pharmacy contact our office.       Resources For Cancer Patients and their Caregivers ? American Cancer Society: Can assist with transportation, wigs, general needs, runs Look Good Feel Better.        626-530-9257 ? Cancer Care: Provides financial assistance, online support groups, medication/co-pay assistance.  1-800-813-HOPE 346-082-7046) ? [facility] Assists [place] cancer patients  and their families through emotional , educational and financial support.  (231) 805-4230 ? [place] DSS Where to apply for food stamps, Medicaid and utility assistance. 713-510-4001 ? RCATS: Transportation to medical appointments. (415) 014-0871 ? Social Security Administration: May apply for disability if have a Stage IV cancer. (434)470-5494 540-144-3536 ? [employer], Disability and Transit Services: Assists with nutrition, care and transit needs. [place] Support Programs: @10RELATIVEDAYS @ > Cancer Support Group  2nd Tuesday of the month 1pm-2pm, Journey Room  > Creative Journey  3rd Tuesday of the month 1130am-1pm, Journey Room  > Look Good Feel Better  1st Wednesday of the month 10am-12 noon, Journey Room (Call [place] to register [phone])

## 2016-10-25 NOTE — Patient Instructions (Signed)
[facility] at [facility] Discharge Instructions  RECOMMENDATIONS MADE BY THE CONSULTANT AND ANY TEST RESULTS WILL BE SENT TO YOUR REFERRING PHYSICIAN.  You saw Gretchen Dawson, NP, today See Amy at checkout for appointments.   Thank you for choosing [facility] at [facility] to provide your oncology and hematology care.  To afford each patient quality time with our provider, please arrive at least 15 minutes before your scheduled appointment time.    If you have a lab appointment with the Cancer Center please come in thru the  Main Entrance and check in at the main information desk  You need to re-schedule your appointment should you arrive 10 or more minutes late.  We strive to give you quality time with our providers, and arriving late affects you and other patients whose appointments are after yours.  Also, if you no show three or more times for appointments you may be dismissed from the clinic at the providers discretion.     Again, thank you for choosing [facility].  Our hope is that these requests will decrease the amount of time that you wait before being seen by our physicians.       _____________________________________________________________  Should you have questions after your visit to [facility], please contact our office at [phone] between the hours of 8:30 a.m. and 4:30 p.m.  Voicemails left after 4:30 p.m. will not be returned until the following business day.  For prescription refill requests, have your pharmacy contact our office.       Resources For Cancer Patients and their Caregivers ? American Cancer Society: Can assist with transportation, wigs, general needs, runs Look Good Feel Better.        1-888-227-6333 ? Cancer Care: Provides financial assistance, online support groups, medication/co-pay assistance.  1-800-813-HOPE (4673) ? [facility] Assists  [place] cancer patients and their families through emotional , educational and financial support.  336-427-4357 ? [place] DSS Where to apply for food stamps, Medicaid and utility assistance. 336-342-1394 ? RCATS: Transportation to medical appointments. 336-347-2287 ? Social Security Administration: May apply for disability if have a Stage IV cancer. 336-342-7796 1-800-772-1213 ? [place] Aging, Disability and Transit Services: Assists with nutrition, care and transit needs. 336-349-2343  Cancer Center Support Programs: @10RELATIVEDAYS@ > Cancer Support Group  2nd Tuesday of the month 1pm-2pm, Journey Room  > Creative Journey  3rd Tuesday of the month 1130am-1pm, Journey Room  > Look Good Feel Better  1st Wednesday of the month 10am-12 noon, Journey Room (Call American Cancer Society to register 1-800-395-5775)    

## 2016-11-07 ENCOUNTER — Encounter (HOSPITAL_COMMUNITY): Payer: Managed Care, Other (non HMO) | Attending: Hematology & Oncology

## 2016-11-07 VITALS — BP 121/54 | HR 96 | Temp 98.0°F | Resp 18

## 2016-11-07 DIAGNOSIS — D509 Iron deficiency anemia, unspecified: Secondary | ICD-10-CM | POA: Diagnosis not present

## 2016-11-07 DIAGNOSIS — D500 Iron deficiency anemia secondary to blood loss (chronic): Secondary | ICD-10-CM

## 2016-11-07 MED ORDER — HEPARIN SOD (PORK) LOCK FLUSH 100 UNIT/ML IV SOLN
INTRAVENOUS | Status: AC
  Filled 2016-11-07: qty 5

## 2016-11-07 MED ORDER — SODIUM CHLORIDE 0.9 % IV SOLN
Freq: Once | INTRAVENOUS | Status: AC
  Administered 2016-11-07: 14:00:00 via INTRAVENOUS

## 2016-11-07 MED ORDER — HEPARIN SOD (PORK) LOCK FLUSH 100 UNIT/ML IV SOLN
500.0000 [IU] | Freq: Once | INTRAVENOUS | Status: AC | PRN
  Administered 2016-11-07: 500 [IU]

## 2016-11-07 MED ORDER — SODIUM CHLORIDE 0.9 % IV SOLN
510.0000 mg | Freq: Once | INTRAVENOUS | Status: AC
  Administered 2016-11-07: 510 mg via INTRAVENOUS
  Filled 2016-11-07: qty 17

## 2016-11-07 NOTE — Patient Instructions (Signed)
[place] at [facility] Discharge Instructions  RECOMMENDATIONS MADE BY THE CONSULTANT AND ANY TEST RESULTS WILL BE SENT TO YOUR REFERRING PHYSICIAN.  Feraheme 510 mg iron infusion given as ordered.  Thank you for choosing [place] at [facility] to provide your oncology and hematology care.  To afford each patient quality time with our provider, please arrive at least 15 minutes before your scheduled appointment time.    If you have a lab appointment with the [facility] please come in thru the  Main Entrance and check in at the main information desk  You need to re-schedule your appointment should you arrive 10 or more minutes late.  We strive to give you quality time with our providers, and arriving late affects you and other patients whose appointments are after yours.  Also, if you no show three or more times for appointments you may be dismissed from the clinic at the providers discretion.     Again, thank you for choosing [facility].  Our hope is that these requests will decrease the amount of time that you wait before being seen by our physicians.       _____________________________________________________________  Should you have questions after your visit to [facility], please contact our office at [phone] between the hours of 8:30 a.m. and 4:30 p.m.  Voicemails left after 4:30 p.m. will not be returned until the following business day.  For prescription refill requests, have your pharmacy contact our office.       Resources For Cancer Patients and their Caregivers ? American Cancer Society: Can assist with transportation, wigs, general needs, runs Look Good Feel Better.        607 386 6444 ? Cancer Care: Provides financial assistance, online support groups, medication/co-pay assistance.  1-800-813-HOPE 940 137 5875) ? [facility] Assists [place] cancer patients and  their families through emotional , educational and financial support.  5718128494 ? [place] DSS Where to apply for food stamps, Medicaid and utility assistance. (772) 837-1481 ? RCATS: Transportation to medical appointments. 725 309 7705 ? Social Security Administration: May apply for disability if have a Stage IV cancer. (404)687-6994 332-293-6351 ? [employer], Disability and Transit Services: Assists with nutrition, care and transit needs. [place] Support Programs: @10RELATIVEDAYS @ > Cancer Support Group  2nd Tuesday of the month 1pm-2pm, Journey Room  > Creative Journey  3rd Tuesday of the month 1130am-1pm, Journey Room  > Look Good Feel Better  1st Wednesday of the month 10am-12 noon, Journey Room (Call [place] to register [phone])

## 2016-11-07 NOTE — Progress Notes (Signed)
Tolerated iron infusion well. Stable on discharge home with family via wheelchair.

## 2016-12-25 ENCOUNTER — Encounter (HOSPITAL_COMMUNITY): Payer: Managed Care, Other (non HMO)

## 2017-01-06 ENCOUNTER — Encounter: Payer: Self-pay | Admitting: Gastroenterology

## 2017-01-14 ENCOUNTER — Encounter (HOSPITAL_COMMUNITY): Payer: Self-pay

## 2017-01-14 ENCOUNTER — Encounter (HOSPITAL_COMMUNITY): Payer: Medicare Other | Attending: Hematology & Oncology

## 2017-01-14 VITALS — BP 160/65 | HR 92 | Temp 98.2°F | Resp 18

## 2017-01-14 DIAGNOSIS — D509 Iron deficiency anemia, unspecified: Secondary | ICD-10-CM

## 2017-01-14 DIAGNOSIS — D500 Iron deficiency anemia secondary to blood loss (chronic): Secondary | ICD-10-CM | POA: Insufficient documentation

## 2017-01-14 DIAGNOSIS — Z452 Encounter for adjustment and management of vascular access device: Secondary | ICD-10-CM

## 2017-01-14 DIAGNOSIS — Z95828 Presence of other vascular implants and grafts: Secondary | ICD-10-CM

## 2017-01-14 MED ORDER — SODIUM CHLORIDE 0.9% FLUSH
10.0000 mL | INTRAVENOUS | Status: DC | PRN
  Administered 2017-01-14: 10 mL via INTRAVENOUS
  Filled 2017-01-14: qty 10

## 2017-01-14 MED ORDER — HEPARIN SOD (PORK) LOCK FLUSH 100 UNIT/ML IV SOLN
500.0000 [IU] | Freq: Once | INTRAVENOUS | Status: AC
  Administered 2017-01-14: 500 [IU] via INTRAVENOUS
  Filled 2017-01-14: qty 5

## 2017-01-14 NOTE — Patient Instructions (Signed)
[place] at [facility] Discharge Instructions  RECOMMENDATIONS MADE BY THE CONSULTANT AND ANY TEST RESULTS WILL BE SENT TO YOUR REFERRING PHYSICIAN.  You had your port flushed today, continue to have it flushed every 6-8 weeks. Follow up as previously scheduled.  Thank you for choosing [place] at [facility] to provide your oncology and hematology care.  To afford each patient quality time with our provider, please arrive at least 15 minutes before your scheduled appointment time.    If you have a lab appointment with the [facility] please come in thru the  Main Entrance and check in at the main information desk  You need to re-schedule your appointment should you arrive 10 or more minutes late.  We strive to give you quality time with our providers, and arriving late affects you and other patients whose appointments are after yours.  Also, if you no show three or more times for appointments you may be dismissed from the clinic at the providers discretion.     Again, thank you for choosing [facility].  Our hope is that these requests will decrease the amount of time that you wait before being seen by our physicians.       _____________________________________________________________  Should you have questions after your visit to [facility], please contact our office at [phone] between the hours of 8:30 a.m. and 4:30 p.m.  Voicemails left after 4:30 p.m. will not be returned until the following business day.  For prescription refill requests, have your pharmacy contact our office.       Resources For Cancer Patients and their Caregivers ? American Cancer Society: Can assist with transportation, wigs, general needs, runs Look Good Feel Better.        309-155-8575 ? Cancer Care: Provides financial assistance, online support groups, medication/co-pay assistance.  1-800-813-HOPE (781)699-1755) ? [place] Assists [place] cancer patients and their families through emotional , educational and financial support.  3515027504 ? [place] DSS Where to apply for food stamps, Medicaid and utility assistance. 8655428535 ? [facility]: Transportation to medical appointments. (623) 878-0796 ? Social Security Administration: May apply for disability if have a Stage IV cancer. 8066759316 (769) 041-5080 ? [employer], Disability and Transit Services: Assists with nutrition, care and transit needs. [place] Support Programs: @10RELATIVEDAYS @ > Cancer Support Group  2nd Tuesday of the month 1pm-2pm, Journey Room  > Creative Journey  3rd Tuesday of the month 1130am-1pm, Journey Room  > Look Good Feel Better  1st Wednesday of the month 10am-12 noon, Journey Room (Call [place] to register [phone])

## 2017-01-14 NOTE — Progress Notes (Signed)
[name] presented for Portacath access and flush. Portacath located right chest wall accessed with  H 20 needle. Good blood return present. Portacath flushed with 13ml NS and 500U/55ml Heparin and needle removed intact. Procedure without incident. Patient tolerated procedure well. Patient discharged ambulatory with walker in stable condition from clinic to self.

## 2017-02-03 ENCOUNTER — Ambulatory Visit: Payer: Managed Care, Other (non HMO) | Admitting: Cardiology

## 2017-02-04 NOTE — Progress Notes (Signed)
Cardiology Office Note  Date: 02/06/2017   ID: Rita, [name] August 07, 1945, MRN [id number]  PCP: [name]., MD  Primary Cardiologist: [name], MD   Chief Complaint  Patient presents with  . Diastolic heart failure    History of Present Illness: [name] is a 71 y.o. female last seen in December 2017. She presents for a routine follow-up visit. Her weight is down a few pounds compared to March, but she states that she has had more frequent use of additional Lasix due to leg swelling. She has been drinking more fluid during the hot summer months.  She does not report any angina symptoms.  I reviewed her medications which are outlined below. She continues on aspirin, Lipitor, Lasix 40 mg daily, Diovan, and Toprol-XL.  Past Medical History:  Diagnosis Date  . CAD (coronary artery disease)    Nonobstructive at cardiac catheterization 2000  . Chronic back pain   . COPD (chronic obstructive pulmonary disease) ([place])   . Degenerative disc disease   . Essential hypertension, benign   . GERD (gastroesophageal reflux disease)   . Gout   . Hiatal hernia 07/27/2013  . History of diverticulitis of colon   . History of hiatal hernia   . Iron deficiency anemia   . Irritable bowel syndrome   . Lumbar radiculopathy   . Mixed hyperlipidemia   . Neuropathy   . Ovarian cancer ([place]) 04/02/2016  . Sleep apnea   . Type 2 diabetes mellitus ([place])   . Vitamin B deficiency 12/25/2009  . Vitamin B12 deficiency     Past Surgical History:  Procedure Laterality Date  . ABDOMINAL HYSTERECTOMY    . BACK SURGERY    . Benign breast cysts    . CHOLECYSTECTOMY    . COLONOSCOPY  10/01/2006   SLF:Pan colonic diverticulosis and moderate internal hemorrhoids/ Otherwise no polyps, masses, inflammatory changes or AVMs/  . COLONOSCOPY  2011   SLF: pancolonic diverticulosis, large internal hemorrhoids  . COLONOSCOPY N/A 01/26/2016   Procedure: COLONOSCOPY;  Surgeon: [name], MD;  [facility]: [facility];  Service: Endoscopy;  Laterality: N/A;  830   . ESOPHAGOGASTRODUODENOSCOPY  11/19/2006   SLF: Large hiatal hernia without evidence of Cameron ulcers/. Distal esophageal stricture, which allowed the gastroscope to pass without resistance.  A 16 mm Savary later passed with mild resistance/ Normal stomach.sb bx negative  . ESOPHAGOGASTRODUODENOSCOPY  10/01/2006   WUJ:WJXBJ hiatal hernia.  Distal esophagus without evidence of   erythema, ulceration or Barrett's esophagus  . ESOPHAGOGASTRODUODENOSCOPY  2011   SLF: large hh, distal esophageal web narrowing to 42m s/p dilation to 171m . ESOPHAGOGASTRODUODENOSCOPY N/A 08/06/2013   SLF: 1. Stricture at the gastroesophageal junction 2. large hiatal hernia. 3. Mild erosive gastritis.  . Marland KitchenIVENS CAPSULE STUDY N/A 08/06/2013   INCOMPLETE-SMALL BOWLE ULCERS  . KNEE SURGERY Right   . PARTIAL HYSTERECTOMY  1978  . small bowel capsule  2008   negative  . TONSILLECTOMY AND ADENOIDECTOMY    . Two back surgeries/fusion    . UMBILICAL HERNIA REPAIR  2010    Current Outpatient Prescriptions  Medication Sig Dispense Refill  . albuterol (PROVENTIL) (2.5 MG/3ML) 0.083% nebulizer solution Take 3 mLs (2.5 mg total) by nebulization every 6 (six) hours as needed for wheezing or shortness of breath. 75 mL 12  . allopurinol (ZYLOPRIM) 100 MG tablet Take 1 tablet by mouth 2 (two) times daily.    . Marland Kitchenmitriptyline (ELAVIL) 100 MG  tablet Take 100 mg by mouth at bedtime.      Marland Kitchen aspirin EC 81 MG tablet Take 81 mg by mouth daily.    Marland Kitchen atorvastatin (LIPITOR) 40 MG tablet Take 1 tablet by mouth at bedtime.     . B-D ULTRA-FINE 33 LANCETS MISC USE TO CHECK BG 3 TIMES A DAY    . BIOTIN PO Take 1 capsule by mouth daily.     . cyanocobalamin (,VITAMIN B-12,) 1000 MCG/ML injection INJECT 1 ML INTRAMUSCULARLY EVERY 30 DAYS (DISCARD AFTER 28 DAYS) 3 mL 2  . esomeprazole (NEXIUM) 40 MG capsule TAKE 1 CAPSULE TWICE A DAY 30 MINUTES PRIOR TO MEALS 180  capsule 2  . famotidine (PEPCID) 40 MG tablet TAKE 1 TABLET AT BEDTIME AS NEEDED TO CONTROL REFLUX (Patient taking differently: Take 40 mg by mouth at bedtime as needed for heartburn or indigestion. ) 90 tablet 1  . fluticasone furoate-vilanterol (BREO ELLIPTA) 100-25 MCG/INH AEPB Inhale 1 puff into the lungs daily.    . insulin lispro (HUMALOG) 100 UNIT/ML KiwkPen Use as directed [place] by sliding scale with meals. MDD=50 units    . LANTUS SOLOSTAR 100 UNIT/ML SOPN Inject 26 Units into the skin at bedtime.     Marland Kitchen linaclotide (LINZESS) 72 MCG capsule Take 1 capsule (72 mcg total) by mouth daily before breakfast. (Patient taking differently: Take 72 mcg by mouth every other day. ) 90 capsule 3  . loperamide (IMODIUM) 2 MG capsule Take 2 mg by mouth as needed for diarrhea or loose stools.    . meclizine (ANTIVERT) 25 MG tablet Take 1 tablet (25 mg total) by mouth 3 (three) times daily as needed for dizziness. 30 tablet 0  . metFORMIN (GLUCOPHAGE) 500 MG tablet Take 1,000 mg by mouth 2 (two) times daily.    . metoprolol succinate (TOPROL-XL) 25 MG 24 hr tablet Take 50 mg by mouth daily.     . Misc. Devices MISC 3cc , 25G x 1" [place] Syringe for Vitamin B12 injections monthly 12 each 0  . ondansetron (ZOFRAN) 4 MG tablet Take 1 tablet (4 mg total) by mouth every 8 (eight) hours as needed for nausea or vomiting. 45 tablet 2  . tiotropium (SPIRIVA) 18 MCG inhalation capsule Place 18 mcg into inhaler and inhale daily.    . valsartan (DIOVAN) 160 MG tablet Take 160 mg by mouth daily.    . furosemide (LASIX) 40 MG tablet Take 1.5 tablets (60 mg total) by mouth daily. 30 tablet 3   No current facility-administered medications for this visit.    Allergies:  Patient has no known allergies.   Social History: The patient  reports that she quit smoking about 55 years ago. Her smoking use included Cigarettes. She started smoking about 56 years ago. She has a 1.00 pack-year smoking history. She has never used  smokeless tobacco. She reports that she does not drink alcohol or use drugs.   ROS:  Please see the history of present illness. Otherwise, complete review of systems is positive for none.  All other systems are reviewed and negative.   Physical Exam: VS:  BP 140/68   Pulse 84   Ht _0  (1.499 m)   Wt 228 lb 9.6 oz (103.7 kg)   SpO2 95%   BMI 46.17 kg/m , BMI Body mass index is 46.17 kg/m.  Wt Readings from Last 3 Encounters:  02/06/17 228 lb 9.6 oz (103.7 kg)  10/25/16 232 lb 14.4 oz (105.6 kg)  08/29/16 235  lb 3.2 oz (106.7 kg)    General: Overweight woman,appears comfortable at rest. HEENT: Conjunctiva and lids normal, oropharynx clear. Neck: Supple, no elevated JVP or carotid bruits, no thyromegaly. Lungs: Clear to auscultation, nonlabored breathing at rest. Cardiac: Regular rate and rhythm, S4, nosignificant systolic murmur, no pericardial rub. Abdomen: Soft, nontender, bowel sounds present. Extremities: Chronic appearing leg edema with venous stasis, distal pulses 1-2+.  ECG: I personally reviewed the tracing from 05/08/2016 which showed sinus tachycardia with nonspecific ST changes.  Recent Labwork: 05/08/2016: B Natriuretic Peptide 79.0 10/25/2016: ALT 12; AST 16; BUN 39; Creatinine, Ser 1.86; Hemoglobin 10.7; Platelets 335; Potassium 4.7; Sodium 144   Other Studies Reviewed Today:  Echocardiogram 05/10/2016: Study Conclusions  - Left ventricle: The cavity size was normal. Wall thickness was increased in a pattern of mild LVH. There was moderate focal basal hypertrophy of the septum. Systolic function was normal. The estimated ejection fraction was in the range of 60% to 65%. Wall motion was normal; there were no regional wall motion abnormalities. Features are consistent with a pseudonormal left ventricular filling pattern, with concomitant abnormal relaxation and increased filling pressure (grade 2 diastolic dysfunction). - Aortic valve: Mildly  calcified annulus. Trileaflet; mildly calcified leaflets. There was trivial regurgitation. Mean gradient (S): 5 mm Hg. Valve area (Vmax): 1.92 cm^2. - Mitral valve: Calcified annulus. Mildly thickened leaflets . There was mild regurgitation. - Left atrium: The atrium was moderately dilated. - Right atrium: Central venous pressure (est): 3 mm Hg. - Tricuspid valve: There was trivial regurgitation. - Pulmonary arteries: PA peak pressure: 43 mm Hg (S). - Pericardium, extracardiac: Small circumferential pericardial effusion, possibly somewhat larger collection posteriorly.  Impressions:  - Mild LVH with LVEF 60-65%. Grade 2 diastolic dysfunction with increased LV filling pressure. Moderate left atrial enlargement. MAC with mildly thickened and calcified mitral leaflets, mild mitral regurgitation. Sclerotic aortic valve with trivial aortic regurgitation. Trivial tricuspid regurgitation with PASP 43 mmHg. Small circumferential pericardial effusion, possibly somewhat larger collection posteriorly.  Assessment and Plan:  1. Chronic diastolic heart failure with bilateral leg edema. We'll try to increase her Lasix to 60 mg daily for now. Follow-up BMET in the next 2 weeks.  2. Nonobstructive CAD by prior workup, no active angina symptoms. Continue aspirin and statin.  Current medicines were reviewed with the patient today.   Orders Placed This Encounter  Procedures  . Basic Metabolic Panel (BMET)    Disposition: Follow-up in 4 months.  Signed, Satira Sark, MD, [facility] 02/06/2017 2:42 PM    [facility], [place], [place] [address] Phone: [phone]; Fax: [phone]

## 2017-02-06 ENCOUNTER — Encounter: Payer: Self-pay | Admitting: Cardiology

## 2017-02-06 ENCOUNTER — Ambulatory Visit (INDEPENDENT_AMBULATORY_CARE_PROVIDER_SITE_OTHER): Payer: Managed Care, Other (non HMO) | Admitting: Cardiology

## 2017-02-06 VITALS — BP 140/68 | HR 84 | Ht 59.0 in | Wt 228.6 lb

## 2017-02-06 DIAGNOSIS — I25810 Atherosclerosis of coronary artery bypass graft(s) without angina pectoris: Secondary | ICD-10-CM | POA: Diagnosis not present

## 2017-02-06 DIAGNOSIS — I5032 Chronic diastolic (congestive) heart failure: Secondary | ICD-10-CM

## 2017-02-06 MED ORDER — FUROSEMIDE 40 MG PO TABS
60.0000 mg | ORAL_TABLET | Freq: Every day | ORAL | 3 refills | Status: DC
Start: 2017-02-06 — End: 2017-06-23

## 2017-02-06 NOTE — Patient Instructions (Signed)
Medication Instructions:  Your physician has recommended you make the following change in your medication:  BEGIN taking lasix 60 mg daily  Labwork: BMET in 2 weeks  Testing/Procedures: NONE  Follow-Up: Your physician wants you to follow-up in: [place]. [place]. You will receive a reminder letter in the mail two months in advance. If you don't receive a letter, please call our office to schedule the follow-up appointment.  Any Other Special Instructions Will Be Listed Below (If Applicable).  If you need a refill on your cardiac medications before your next appointment, please call your pharmacy.

## 2017-02-24 ENCOUNTER — Encounter (HOSPITAL_COMMUNITY): Payer: Managed Care, Other (non HMO)

## 2017-03-04 ENCOUNTER — Encounter (HOSPITAL_COMMUNITY): Payer: Managed Care, Other (non HMO)

## 2017-03-11 ENCOUNTER — Encounter (HOSPITAL_COMMUNITY): Payer: Managed Care, Other (non HMO) | Attending: Hematology & Oncology

## 2017-03-11 ENCOUNTER — Encounter (HOSPITAL_COMMUNITY): Payer: Self-pay

## 2017-03-11 DIAGNOSIS — D509 Iron deficiency anemia, unspecified: Secondary | ICD-10-CM | POA: Diagnosis not present

## 2017-03-11 DIAGNOSIS — Z452 Encounter for adjustment and management of vascular access device: Secondary | ICD-10-CM | POA: Diagnosis not present

## 2017-03-11 DIAGNOSIS — D500 Iron deficiency anemia secondary to blood loss (chronic): Secondary | ICD-10-CM | POA: Insufficient documentation

## 2017-03-11 MED ORDER — HEPARIN SOD (PORK) LOCK FLUSH 100 UNIT/ML IV SOLN
500.0000 [IU] | Freq: Once | INTRAVENOUS | Status: AC
  Administered 2017-03-11: 500 [IU] via INTRAVENOUS
  Filled 2017-03-11: qty 5

## 2017-03-11 MED ORDER — SODIUM CHLORIDE 0.9% FLUSH
10.0000 mL | INTRAVENOUS | Status: DC | PRN
  Administered 2017-03-11: 10 mL via INTRAVENOUS
  Filled 2017-03-11: qty 10

## 2017-03-11 NOTE — Progress Notes (Signed)
[name] presented for Portacath access and flush.  Portacath located right chest wall accessed with  H 20 needle.  Good blood return present. Portacath flushed with 25ml NS and 500U/59ml Heparin and needle removed intact.  Procedure tolerated well and without incident. Discharged via wheelchair.

## 2017-03-15 DIAGNOSIS — M109 Gout, unspecified: Secondary | ICD-10-CM | POA: Insufficient documentation

## 2017-04-03 ENCOUNTER — Encounter: Payer: Self-pay | Admitting: Gastroenterology

## 2017-04-03 ENCOUNTER — Ambulatory Visit (INDEPENDENT_AMBULATORY_CARE_PROVIDER_SITE_OTHER): Payer: Managed Care, Other (non HMO) | Admitting: Gastroenterology

## 2017-04-03 DIAGNOSIS — I25810 Atherosclerosis of coronary artery bypass graft(s) without angina pectoris: Secondary | ICD-10-CM

## 2017-04-03 DIAGNOSIS — K589 Irritable bowel syndrome without diarrhea: Secondary | ICD-10-CM | POA: Insufficient documentation

## 2017-04-03 DIAGNOSIS — R1310 Dysphagia, unspecified: Secondary | ICD-10-CM

## 2017-04-03 DIAGNOSIS — G43A0 Cyclical vomiting, not intractable: Secondary | ICD-10-CM | POA: Diagnosis not present

## 2017-04-03 DIAGNOSIS — K2100 Gastro-esophageal reflux disease with esophagitis, without bleeding: Secondary | ICD-10-CM

## 2017-04-03 DIAGNOSIS — R1319 Other dysphagia: Secondary | ICD-10-CM

## 2017-04-03 DIAGNOSIS — K582 Mixed irritable bowel syndrome: Secondary | ICD-10-CM | POA: Diagnosis not present

## 2017-04-03 DIAGNOSIS — R1115 Cyclical vomiting syndrome unrelated to migraine: Secondary | ICD-10-CM

## 2017-04-03 NOTE — Progress Notes (Signed)
CC'D TO PCP °

## 2017-04-03 NOTE — Assessment & Plan Note (Signed)
SYMPTOMS FAIRLY WELL CONTROLLED.  CONTINUE TO MONITOR SYMPTOMS. 

## 2017-04-03 NOTE — Assessment & Plan Note (Addendum)
CONSTIPATION RESOLVED AND NOW HAS DIARRHEA AND SYMPTOMS NOT IDEALLY CONTROLLED.  HOLD LINZESS. DRINK WATER TO KEEP YOUR URINE LIGHT YELLOW. USE IMODIUM IF NEEDED TO CONTROL DIARRHEA. ADD LACTASE 3 PILLS WITH MEALS THREE TIMES A DAY.  CALL WITH QUESTIONS OR CONCERNS RE: DAILY/PERSISTENT DIARRHEA. WILL NEED C DIFF PCR. DISCUSSED RISK FACTORS FOR GETTING CDIFF: AGE, MEDS, DIABETES AND SIGNS/SYMPTOMS.  FOLLOW UP IN 6 MOS.

## 2017-04-03 NOTE — Assessment & Plan Note (Signed)
CONTRIBUTES TO ELEVATED BLOOD GLUCOSE AND IMPAIRED GASTRIC EMPTYING WHICH LEADS TO EPISODIC NAUSEA/VOMTIING.  CONTINUE YOUR WEIGHT LOSS EFFORTS.

## 2017-04-03 NOTE — Progress Notes (Signed)
Subjective:    Patient ID: [name], female    DOB: 06-20-1946, 71 y.o.   MRN: [id number]  [name]., MD  HPI Been having real loose stool. Happens once week to four times a week. MAY HAVE 2-3 EPISODES. ACCIDENTS: 3-4X/WEEK(#5-7). MAY BE UP NIGHT TO HAVE A BM: 2X/WEEK. BEEN HAPPENING FOR PAST MO. MILK: NONE CHEESE: NONE ICE CREAM: NONE. EATS OUT 2-3 TIMES A WEEK([employer] OR [employer]). A LITTLE NAUSEA: 2X/WEEK. VOMITING: 2X IN PAST MO(NO BLOOD). LAST NL STOOL: YESTERDAY. STRESS: NOT A LOT, MAYBE A LITTLE MORE IN PAST MO(HUSBAND NEEDS BIOPSY OF BLADDER TO CHECK FOR CANCER). SOB: BEEN PRETTY ROUGH LAST 3-4 DAYS. HAS A BAD COUGH. PAIN(SHARP-->STABBING) AROUND: MOST OF THE TIME BEFORE DIARRHEA AND GETS BETTER FEW HRS AFTER DIARRHEA. STOPPED LINZESS DUE TO NO PROBLEMS WITH CONSTIPATION. ON DOXYCYCLINE FOR BREATHING BUT SYMPTOMS STARTED BEFORE ABX(NO CHANGE). SUGARS RUNNING BETWEEN 300-500 ON STEROIDS.  PT DENIES FEVER, CHILLS, HEMATOCHEZIA, melena, CHEST PAIN, CHANGE IN BOWEL IN HABITS, problems swallowing, OR heartburn or indigestion.  Past Medical History:  Diagnosis Date  . CAD (coronary artery disease)    Nonobstructive at cardiac catheterization 2000  . Chronic back pain   . COPD (chronic obstructive pulmonary disease) ([place])   . Degenerative disc disease   . Essential hypertension, benign   . GERD (gastroesophageal reflux disease)   . Gout   . Hiatal hernia 07/27/2013  . History of diverticulitis of colon   . History of hiatal hernia   . Iron deficiency anemia   . Irritable bowel syndrome   . Lumbar radiculopathy   . Mixed hyperlipidemia   . Neuropathy   . Ovarian cancer ([place]) 04/02/2016  . Sleep apnea   . Type 2 diabetes mellitus ([place])   . Vitamin B deficiency 12/25/2009  . Vitamin B12 deficiency    Past Surgical History:  Procedure Laterality Date  . ABDOMINAL HYSTERECTOMY    . BACK SURGERY    . Benign breast cysts    . CHOLECYSTECTOMY    . COLONOSCOPY   10/01/2006   SLF:Pan colonic diverticulosis and moderate internal hemorrhoids/ Otherwise no polyps, masses, inflammatory changes or AVMs/  . COLONOSCOPY  2011   SLF: pancolonic diverticulosis, large internal hemorrhoids  . COLONOSCOPY N/A 01/26/2016   Procedure: COLONOSCOPY;  Surgeon: [name], MD;  [facility];  Service: Endoscopy;  Laterality: N/A;  830   . ESOPHAGOGASTRODUODENOSCOPY  11/19/2006   SLF: Large hiatal hernia without evidence of Cameron ulcers/. Distal esophageal stricture, which allowed the gastroscope to pass without resistance.  A 16 mm Savary later passed with mild resistance/ Normal stomach.sb bx negative  . ESOPHAGOGASTRODUODENOSCOPY  10/01/2006   FWY:OVZCH hiatal hernia.  Distal esophagus without evidence of   erythema, ulceration or Barrett's esophagus  . ESOPHAGOGASTRODUODENOSCOPY  2011   SLF: large hh, distal esophageal web narrowing to 38mm s/p dilation to 43mm  . ESOPHAGOGASTRODUODENOSCOPY N/A 08/06/2013   SLF: 1. Stricture at the gastroesophageal junction 2. large hiatal hernia. 3. Mild erosive gastritis.  Marland Kitchen GIVENS CAPSULE STUDY N/A 08/06/2013   INCOMPLETE-SMALL BOWLE ULCERS  . KNEE SURGERY Right   . PARTIAL HYSTERECTOMY  1978  . small bowel capsule  2008   negative  . TONSILLECTOMY AND ADENOIDECTOMY    . Two back surgeries/fusion    . UMBILICAL HERNIA REPAIR  2010   No Known Allergies  Current Outpatient Prescriptions  Medication Sig Dispense Refill  . albuterol (PROVENTIL) (2.5 MG/3ML) 0.083% nebulizer solution Take  3 mLs (2.5 mg total) by nebulization every 6 (six) hours as needed for wheezing or shortness of breath.    . allopurinol (ZYLOPRIM) 100 MG tablet Take 1 tablet by mouth 2 (two) times daily.    Marland Kitchen amitriptyline (ELAVIL) 100 MG tablet Take 100 mg by mouth at bedtime.      Marland Kitchen aspirin EC 81 MG tablet Take 81 mg by mouth daily.    Marland Kitchen atorvastatin (LIPITOR) 40 MG tablet Take 1 tablet by mouth at bedtime.     . B-D ULTRA-FINE 33 LANCETS  MISC USE TO CHECK BG 3 TIMES A DAY    . BIOTIN PO Take 1 capsule by mouth daily.     . cyanocobalamin  INJECT 1 [id number]    . doxycycline (VIBRA-TABS) 100 MG tablet Take 100 mg by mouth 2 (two) times daily. X 7 days    . esomeprazole (NEXIUM) 40 MG capsule TAKE 1 CAPSULE TWICE A DAY 30 MINUTES PRIOR TO MEALS ONCE DAY MOSTLY   . famotidine (PEPCID) 40 MG tablet TAKE 1 TABLET AT BEDTIME AS NEEDED TO CONTROL REFLUX  1X/WK OR LESS   . fluticasone furoate-vilanterol (BREO ELLIPTA) 100-25 MCG/INH AEPB Inhale 1 puff into the lungs daily.    . furosemide (LASIX) 40 MG tablet Take 1.5 tablets (60 mg total) by mouth daily. 30 tablet 3  . insulin lispro (HUMALOG) 100 UNIT/ML KiwkPen Use as directed [place] by sliding scale with meals. MDD=50 units    . LANTUS SOLOSTAR 100 UNIT/ML SOPN Inject 26 Units into the skin at bedtime.     Marland Kitchen loperamide (IMODIUM) 2 MG capsule Take 2 mg by mouth as needed for diarrhea or loose stools. 2X IN PAST 3 WKS   . meclizine (ANTIVERT) 25 MG tablet Take 1 tablet (25 mg total) by mouth 3 (three) times daily as needed for dizziness.    . metFORMIN (GLUCOPHAGE) 500 MG tablet Take 1,000 mg by mouth 2 (two) times daily.    . metoprolol succinate (TOPROL-XL) 25 MG 24 hr tablet Take 50 mg by mouth daily.     .      . ondansetron (ZOFRAN) 4 MG tablet Take 1 tablet (4 mg total) by mouth every 8 (eight) hours as needed for nausea or vomiting.    . predniSONE (DELTASONE) 20 MG tablet     . telmisartan (MICARDIS) 80 MG tablet Take 80 mg by mouth daily.    Marland Kitchen tiotropium (SPIRIVA) 18 MCG inhalation capsule Place 18 mcg into inhaler and inhale daily.    . valsartan (DIOVAN) 160 MG tablet Take 160 mg by mouth daily.    .       Review of Systems PER HPI OTHERWISE ALL SYSTEMS ARE NEGATIVE.    Objective:   Physical Exam  Constitutional: She is oriented to person, place, and time. She appears well-developed and well-nourished. No distress.  HENT:  Head: Normocephalic and atraumatic.    Mouth/Throat: Oropharynx is clear and moist. No oropharyngeal exudate.  Eyes: Pupils are equal, round, and reactive to light. No scleral icterus.  Neck: Normal range of motion. Neck supple.  Cardiovascular: Normal rate, regular rhythm and normal heart sounds.   Pulmonary/Chest: Effort normal and breath sounds normal. No respiratory distress.  Abdominal: Soft. Bowel sounds are normal. She exhibits no distension. There is no tenderness.  Musculoskeletal: She exhibits no edema.  Lymphadenopathy:    She has no cervical adenopathy.  Neurological: She is alert and oriented to person, place, and time.  Psychiatric: She has  a normal mood and affect.  Vitals reviewed.     Assessment & Plan:

## 2017-04-03 NOTE — Progress Notes (Signed)
ON RECALL  °

## 2017-04-03 NOTE — Patient Instructions (Addendum)
DRINK WATER TO KEEP YOUR URINE LIGHT YELLOW.  USE IMODIUM IF NEEDED TO CONTROL DIARRHEA.  ADD LACTASE 3 PILLS WITH MEALS THREE TIMES A DAY.  PLEASE CALL WITH QUESTIONS OR CONCERNS ESPECIALLY IF YOU HAVE PERSISTENT DAILY DIARRHEA 2-3 WEEKS AFTER STOPPING THE ABX.  FOLLOW UP IN 6 MOS.

## 2017-04-03 NOTE — Assessment & Plan Note (Signed)
SYMPTOMS FAIRLY WELL CONTROLLED.  CONTINUE TO MONITOR SYMPTOMS. CONTINUE NEXIUM. TAKE 30 MINUTES BEFORE MEALS ONCE OR TWICE DAILY. PEPCID IF NEEDED. FOLLOW UP IN 6 MOS.

## 2017-04-28 ENCOUNTER — Encounter (HOSPITAL_COMMUNITY): Payer: Self-pay | Admitting: Oncology

## 2017-04-28 ENCOUNTER — Encounter (HOSPITAL_COMMUNITY): Payer: Managed Care, Other (non HMO) | Attending: Hematology & Oncology | Admitting: Oncology

## 2017-04-28 ENCOUNTER — Encounter (HOSPITAL_COMMUNITY): Payer: Managed Care, Other (non HMO)

## 2017-04-28 VITALS — BP 174/74 | HR 68 | Resp 18 | Ht <= 58 in | Wt 215.0 lb

## 2017-04-28 DIAGNOSIS — D509 Iron deficiency anemia, unspecified: Secondary | ICD-10-CM

## 2017-04-28 DIAGNOSIS — Z95828 Presence of other vascular implants and grafts: Secondary | ICD-10-CM

## 2017-04-28 DIAGNOSIS — E538 Deficiency of other specified B group vitamins: Secondary | ICD-10-CM

## 2017-04-28 DIAGNOSIS — D500 Iron deficiency anemia secondary to blood loss (chronic): Secondary | ICD-10-CM | POA: Insufficient documentation

## 2017-04-28 DIAGNOSIS — E539 Vitamin B deficiency, unspecified: Secondary | ICD-10-CM

## 2017-04-28 LAB — IRON AND TIBC
Iron: 68 ug/dL (ref 28–170)
Saturation Ratios: 22 % (ref 10.4–31.8)
TIBC: 312 ug/dL (ref 250–450)
UIBC: 244 ug/dL

## 2017-04-28 LAB — COMPREHENSIVE METABOLIC PANEL
ALT: 12 U/L — AB (ref 14–54)
AST: 15 U/L (ref 15–41)
Albumin: 3.3 g/dL — ABNORMAL LOW (ref 3.5–5.0)
Alkaline Phosphatase: 91 U/L (ref 38–126)
Anion gap: 8 (ref 5–15)
BUN: 32 mg/dL — ABNORMAL HIGH (ref 6–20)
CHLORIDE: 107 mmol/L (ref 101–111)
CO2: 25 mmol/L (ref 22–32)
Calcium: 8.5 mg/dL — ABNORMAL LOW (ref 8.9–10.3)
Creatinine, Ser: 1.33 mg/dL — ABNORMAL HIGH (ref 0.44–1.00)
GFR calc non Af Amer: 39 mL/min — ABNORMAL LOW (ref >60)
GFR, EST AFRICAN AMERICAN: 45 mL/min — AB (ref >60)
Glucose, Bld: 108 mg/dL — ABNORMAL HIGH (ref 65–99)
Potassium: 5.8 mmol/L — ABNORMAL HIGH (ref 3.5–5.1)
SODIUM: 140 mmol/L (ref 135–145)
Total Bilirubin: 1 mg/dL (ref 0.3–1.2)
Total Protein: 6.9 g/dL (ref 6.5–8.1)

## 2017-04-28 LAB — CBC WITH DIFFERENTIAL/PLATELET
BASOS PCT: 1 %
Basophils Absolute: 0.1 10*3/uL (ref 0.0–0.1)
EOS PCT: 3 %
Eosinophils Absolute: 0.3 10*3/uL (ref 0.0–0.7)
HCT: 34.6 % — ABNORMAL LOW (ref 36.0–46.0)
Hemoglobin: 11 g/dL — ABNORMAL LOW (ref 12.0–15.0)
LYMPHS ABS: 1.3 10*3/uL (ref 0.7–4.0)
Lymphocytes Relative: 16 %
MCH: 27.9 pg (ref 26.0–34.0)
MCHC: 31.8 g/dL (ref 30.0–36.0)
MCV: 87.8 fL (ref 78.0–100.0)
MONO ABS: 0.7 10*3/uL (ref 0.1–1.0)
Monocytes Relative: 8 %
NEUTROS ABS: 6 10*3/uL (ref 1.7–7.7)
Neutrophils Relative %: 72 %
PLATELETS: 258 10*3/uL (ref 150–400)
RBC: 3.94 MIL/uL (ref 3.87–5.11)
RDW: 13.7 % (ref 11.5–15.5)
WBC: 8.4 10*3/uL (ref 4.0–10.5)

## 2017-04-28 LAB — FERRITIN: FERRITIN: 97 ng/mL (ref 11–307)

## 2017-04-28 LAB — VITAMIN B12: Vitamin B-12: 422 pg/mL (ref 180–914)

## 2017-04-28 LAB — FOLATE: Folate: 14.1 ng/mL (ref >5.9)

## 2017-04-28 MED ORDER — HEPARIN SOD (PORK) LOCK FLUSH 100 UNIT/ML IV SOLN
INTRAVENOUS | Status: AC
  Filled 2017-04-28: qty 5

## 2017-04-28 MED ORDER — SODIUM CHLORIDE 0.9% FLUSH
10.0000 mL | INTRAVENOUS | Status: DC | PRN
  Administered 2017-04-28: 10 mL via INTRAVENOUS
  Filled 2017-04-28: qty 10

## 2017-04-28 MED ORDER — HEPARIN SOD (PORK) LOCK FLUSH 100 UNIT/ML IV SOLN
500.0000 [IU] | Freq: Once | INTRAVENOUS | Status: AC
  Administered 2017-04-28: 500 [IU] via INTRAVENOUS

## 2017-04-28 NOTE — Patient Instructions (Signed)
[place] at [facility] Discharge Instructions  RECOMMENDATIONS MADE BY THE CONSULTANT AND ANY TEST RESULTS WILL BE SENT TO YOUR REFERRING PHYSICIAN.  You had your port flushed and labs drawn today. We will call you with the results.  Follow up as scheduled.  Thank you for choosing [place] at [facility] to provide your oncology and hematology care.  To afford each patient quality time with our provider, please arrive at least 15 minutes before your scheduled appointment time.    If you have a lab appointment with the [facility] please come in thru the  Main Entrance and check in at the main information desk  You need to re-schedule your appointment should you arrive 10 or more minutes late.  We strive to give you quality time with our providers, and arriving late affects you and other patients whose appointments are after yours.  Also, if you no show three or more times for appointments you may be dismissed from the clinic at the providers discretion.     Again, thank you for choosing [facility].  Our hope is that these requests will decrease the amount of time that you wait before being seen by our physicians.       _____________________________________________________________  Should you have questions after your visit to [facility], please contact our office at [phone] between the hours of 8:30 a.m. and 4:30 p.m.  Voicemails left after 4:30 p.m. will not be returned until the following business day.  For prescription refill requests, have your pharmacy contact our office.       Resources For Cancer Patients and their Caregivers ? American Cancer Society: Can assist with transportation, wigs, general needs, runs Look Good Feel Better.        773-673-0395 ? Cancer Care: Provides financial assistance, online support groups, medication/co-pay assistance.  1-800-813-HOPE 212-262-3014) ? [facility] Assists [place] cancer patients and their families through emotional , educational and financial support.  618-245-0936 ? [place] DSS Where to apply for food stamps, Medicaid and utility assistance. 640-446-2307 ? [employer]: Transportation to medical appointments. 3020103635 ? Social Security Administration: May apply for disability if have a Stage IV cancer. 580-008-0032 780-181-6798 ? [employer], Disability and Transit Services: Assists with nutrition, care and transit needs. [place] Support Programs: @10RELATIVEDAYS @ > Cancer Support Group  2nd Tuesday of the month 1pm-2pm, Journey Room  > Creative Journey  3rd Tuesday of the month 1130am-1pm, Journey Room  > Look Good Feel Better  1st Wednesday of the month 10am-12 noon, Journey Room (Call [place] to register [phone])

## 2017-04-28 NOTE — Progress Notes (Signed)
[name] presented for Portacath access and flush. Proper placement of portacath confirmed by CXR. Portacath located right chest wall accessed with  H 20 needle. Good blood return present. Portacath flushed with 76ml NS and 500U/33ml Heparin and needle removed intact. Procedure without incident. Patient tolerated procedure well. Patient discharged via wheelchair in stable condition from clinic with significant other. Patient to follow up as scheduled.

## 2017-04-28 NOTE — Progress Notes (Signed)
[place], [place] [address]   CLINIC:  Medical Oncology/Hematology  PCP:  [name]., MD [place] [address] [phone]   REASON FOR VISIT:  Follow-up for Iron deficiency anemia AND vitamin B12 deficiency  CURRENT THERAPY: IV iron prn AND Monthly B12 injections (administered at home)     HISTORY OF PRESENT ILLNESS:  (From Kirby Crigler, PA-C's last note on 04/02/16)     INTERVAL HISTORY:  Ms. Burgen 71 y.o. female presents for routine follow-up for iron deficiency anemia and vitamin B12 deficiency. She has not required IV iron since transferring her care to [facility].    Patient presents today for continued follow-up. She states that overall she feels well but she does have her chronic health issues as listed below and review of systems. She denies any recent melena, hematochezia, hematuria or any other sources of bleeding.    REVIEW OF SYSTEMS:  Review of Systems  Constitutional: Positive for fatigue. Negative for chills and fever.  HENT:   Positive for trouble swallowing (chronic).   Eyes: Negative.   Respiratory: Positive for cough (chronic (COPD)) and shortness of breath (chronic (COPD)).        + congestion (COPD)  Cardiovascular: Positive for leg swelling (chronic ).  Gastrointestinal: Positive for constipation and diarrhea. Negative for abdominal pain, blood in stool, nausea and vomiting.       + heartburn (chronic)   Endocrine: Negative.   Genitourinary: Negative for difficulty urinating ( ), dysuria, hematuria and vaginal bleeding.   Musculoskeletal: Positive for arthralgias and back pain.       (L) leg and hip pain; back pain (chronic)  Neurological: Positive for dizziness and numbness (in her feet and legs from diabetic neuropathy; chronic).       + peripheral neuropathy   Hematological: Negative.   Psychiatric/Behavioral: Positive for sleep disturbance (does use CPAP consistently  every night).     PAST MEDICAL/SURGICAL HISTORY:  Past Medical History:  Diagnosis Date  . CAD (coronary artery disease)    Nonobstructive at cardiac catheterization 2000  . Chronic back pain   . COPD (chronic obstructive pulmonary disease) ([place])   . Degenerative disc disease   . Essential hypertension, benign   . GERD (gastroesophageal reflux disease)   . Gout   . Hiatal hernia 07/27/2013  . History of diverticulitis of colon   . History of hiatal hernia   . Iron deficiency anemia   . Irritable bowel syndrome   . Lumbar radiculopathy   . Mixed hyperlipidemia   . Neuropathy   . Ovarian cancer ([place]) 04/02/2016  . Sleep apnea   . Type 2 diabetes mellitus ([place])   . Vitamin B deficiency 12/25/2009  . Vitamin B12 deficiency    Past Surgical History:  Procedure Laterality Date  . ABDOMINAL HYSTERECTOMY    . BACK SURGERY    . Benign breast cysts    . CHOLECYSTECTOMY    . COLONOSCOPY  10/01/2006   SLF:Pan colonic diverticulosis and moderate internal hemorrhoids/ Otherwise no polyps, masses, inflammatory changes or AVMs/  . COLONOSCOPY  2011   SLF: pancolonic diverticulosis, large internal hemorrhoids  . COLONOSCOPY N/A 01/26/2016   Procedure: COLONOSCOPY;  Surgeon: [name], MD;  Location: [facility];  Service: Endoscopy;  Laterality: N/A;  830   . ESOPHAGOGASTRODUODENOSCOPY  11/19/2006   SLF: Large hiatal hernia without evidence of [place] ulcers/. Distal esophageal stricture, which allowed the gastroscope  to pass without resistance.  A 16 mm Savary later passed with mild resistance/ Normal stomach.sb bx negative  . ESOPHAGOGASTRODUODENOSCOPY  10/01/2006   FAO:ZHYQM hiatal hernia.  Distal esophagus without evidence of   erythema, ulceration or Barrett's esophagus  . ESOPHAGOGASTRODUODENOSCOPY  2011   SLF: large hh, distal esophageal web narrowing to 45mm s/p dilation to 74mm  . ESOPHAGOGASTRODUODENOSCOPY N/A 08/06/2013   SLF: 1. Stricture at the gastroesophageal  junction 2. large hiatal hernia. 3. Mild erosive gastritis.  Marland Kitchen GIVENS CAPSULE STUDY N/A 08/06/2013   INCOMPLETE-SMALL BOWLE ULCERS  . KNEE SURGERY Right   . PARTIAL HYSTERECTOMY  1978  . small bowel capsule  2008   negative  . TONSILLECTOMY AND ADENOIDECTOMY    . Two back surgeries/fusion    . UMBILICAL HERNIA REPAIR  2010     SOCIAL HISTORY:  Social History   Social History  . Marital status: Married    Spouse name: N/A  . Number of children: 4  . Years of education: N/A   Occupational History  . Not on file.   Social History Main Topics  . Smoking status: Former Smoker    Packs/day: 1.00    Years: 1.00    Types: Cigarettes    Start date: 02/19/1961    Quit date: 08/05/1961  . Smokeless tobacco: Never Used     Comment: Quit smoking x 50 years  . Alcohol use No  . Drug use: No  . Sexual activity: Not on file   Other Topics Concern  . Not on file   Social History Narrative   HAS 4 SON-GRAND [place].    FAMILY HISTORY:  Family History  Problem Relation Age of Onset  . Colon cancer Brother        diagnosed age 80. Living.   Marland Kitchen Ulcers Sister   . Diabetes Sister   . Heart attack Sister   . Kidney failure Sister   . Stroke Sister   . Ulcers Mother   . Diabetes Mother   . Heart attack Mother   . Stroke Mother   . Asthma Mother   . Heart attack Brother   . Asthma Sister   . Diabetes Brother   . Stroke Maternal Grandmother   . Heart attack Maternal Grandmother   . Heart attack Other     CURRENT MEDICATIONS:  Outpatient Encounter Prescriptions as of 04/28/2017  Medication Sig Note  . albuterol (PROVENTIL) (2.5 MG/3ML) 0.083% nebulizer solution Take 3 mLs (2.5 mg total) by nebulization every 6 (six) hours as needed for wheezing or shortness of breath.   . allopurinol (ZYLOPRIM) 100 MG tablet Take 1 tablet by mouth 2 (two) times daily.   Marland Kitchen amitriptyline (ELAVIL) 100 MG tablet Take 100 mg by mouth at bedtime.     Marland Kitchen aspirin EC 81 MG tablet Take  81 mg by mouth daily.   Marland Kitchen atorvastatin (LIPITOR) 40 MG tablet Take 1 tablet by mouth at bedtime.    . B-D ULTRA-FINE 33 LANCETS MISC USE TO CHECK BG 3 TIMES A DAY   . BIOTIN PO Take 1 capsule by mouth daily.    . cyanocobalamin (,VITAMIN B-12,) 1000 MCG/ML injection INJECT 1 ML INTRAMUSCULARLY EVERY 30 DAYS (DISCARD AFTER 28 DAYS)   . esomeprazole (NEXIUM) 40 MG capsule TAKE 1 CAPSULE TWICE A DAY 30 MINUTES PRIOR TO MEALS   . famotidine (PEPCID) 40 MG tablet TAKE 1 TABLET AT BEDTIME AS NEEDED TO CONTROL REFLUX (Patient taking differently: Take 40  mg by mouth at bedtime as needed for heartburn or indigestion. )   . fluticasone furoate-vilanterol (BREO ELLIPTA) 100-25 MCG/INH AEPB Inhale 1 puff into the lungs daily.   . furosemide (LASIX) 40 MG tablet Take 1.5 tablets (60 mg total) by mouth daily.   . insulin lispro (HUMALOG) 100 UNIT/ML KiwkPen Use as directed [place] by sliding scale with meals. MDD=50 units   . LANTUS SOLOSTAR 100 UNIT/ML SOPN Inject 26 Units into the skin at bedtime.    Marland Kitchen linaclotide (LINZESS) 72 MCG capsule Take 1 capsule (72 mcg total) by mouth daily before breakfast. 05/16/2016: Dissolves one tab every other day  . loperamide (IMODIUM) 2 MG capsule Take 2 mg by mouth as needed for diarrhea or loose stools.   . meclizine (ANTIVERT) 25 MG tablet Take 1 tablet (25 mg total) by mouth 3 (three) times daily as needed for dizziness.   . metFORMIN (GLUCOPHAGE) 500 MG tablet Take 1,000 mg by mouth 2 (two) times daily.   . metoprolol succinate (TOPROL-XL) 25 MG 24 hr tablet Take 50 mg by mouth daily.    . Misc. Devices MISC 3cc , 25G x 1" [place] Syringe for Vitamin B12 injections monthly   . ondansetron (ZOFRAN) 4 MG tablet Take 1 tablet (4 mg total) by mouth every 8 (eight) hours as needed for nausea or vomiting.   Marland Kitchen telmisartan (MICARDIS) 80 MG tablet Take 80 mg by mouth daily.   Marland Kitchen tiotropium (SPIRIVA) 18 MCG inhalation capsule Place 18 mcg into inhaler and inhale daily.   .  valsartan (DIOVAN) 160 MG tablet Take 160 mg by mouth daily.    Facility-Administered Encounter Medications as of 04/28/2017  Medication  . heparin lock flush 100 unit/mL  . sodium chloride flush (NS) 0.9 % injection 10 mL  . heparin lock flush 100 UNIT/ML injection    ALLERGIES:  No Known Allergies   PHYSICAL EXAM:  ECOG Performance status: 1-2 - Symptomatic; requires occasional assistance.   Vitals:   04/28/17 1016  BP: (!) 174/74  Pulse: 68  Resp: 18  SpO2: 100%   Filed Weights   04/28/17 1016  Weight: 215 lb (97.5 kg)    Physical Exam  Constitutional: She is oriented to person, place, and time and well-developed, well-nourished, and in no distress.  HENT:  Head: Normocephalic.  Mouth/Throat: Oropharynx is clear and moist. No oropharyngeal exudate.  Eyes: Pupils are equal, round, and reactive to light. Conjunctivae are normal. No scleral icterus.  Neck: Normal range of motion. Neck supple.  Cardiovascular: Normal rate, regular rhythm and normal heart sounds.   Pulmonary/Chest: Effort normal and breath sounds normal. No respiratory distress. She has no wheezes. She has no rales.  Abdominal: Soft. Bowel sounds are normal. There is no tenderness. There is no rebound and no guarding.  Musculoskeletal: She exhibits edema (2+ BLE pitting edema ).  Lymphadenopathy:    She has no cervical adenopathy.  Neurological: She is alert and oriented to person, place, and time. No cranial nerve deficit.  Skin: Skin is warm and dry.  Psychiatric: Mood, memory, affect and judgment normal.     LABORATORY DATA:  I have reviewed the labs as listed.  CBC    Component Value Date/Time   WBC 11.5 (H) 10/25/2016 1125   RBC 3.95 10/25/2016 1125   HGB 10.7 (L) 10/25/2016 1125   HCT 33.6 (L) 10/25/2016 1125   PLT 335 10/25/2016 1125   MCV 85.1 10/25/2016 1125   MCH 27.1 10/25/2016 1125   MCHC  31.8 10/25/2016 1125   RDW 14.2 10/25/2016 1125   LYMPHSABS 2.9 10/25/2016 1125   MONOABS  1.0 10/25/2016 1125   EOSABS 0.3 10/25/2016 1125   BASOSABS 0.1 10/25/2016 1125   CMP Latest Ref Rng & Units 10/25/2016 05/10/2016 05/09/2016  Glucose 65 - 99 mg/dL 53(L) 195(H) 234(H)  BUN 6 - 20 mg/dL 39(H) 67(H) 45(H)  Creatinine 0.44 - 1.00 mg/dL 1.86(H) 1.69(H) 1.47(H)  Sodium 135 - 145 mmol/L 144 138 136  Potassium 3.5 - 5.1 mmol/L 4.7 4.1 4.5  Chloride 101 - 111 mmol/L 110 96(L) 96(L)  CO2 22 - 32 mmol/L 28 30 30   Calcium 8.9 - 10.3 mg/dL 8.3(L) 7.5(L) 8.0(L)  Total Protein 6.5 - 8.1 g/dL 7.3 - -  Total Bilirubin 0.3 - 1.2 mg/dL 0.6 - -  Alkaline Phos 38 - 126 U/L 83 - -  AST 15 - 41 U/L 16 - -  ALT 14 - 54 U/L 12(L) - -   Results for MEGHIN, THIVIERGE (MRN [id number])   Ref. Range 10/25/2016 11:45  Iron Latest Ref Range: 28 - 170 ug/dL 40  UIBC Latest Units: ug/dL 297  TIBC Latest Ref Range: 250 - 450 ug/dL 337  Saturation Ratios Latest Ref Range: 10.4 - 31.8 % 12  Ferritin Latest Ref Range: 11 - 307 ng/mL 40   PENDING LABS:    DIAGNOSTIC IMAGING:    PATHOLOGY:  Colonoscopy path: 01/26/16     ASSESSMENT & PLAN:   Iron deficiency anemia:  -Noted to have 1 episode of GI bleeding in 05/2016; this resolved and no recurrent symptoms. No evidence of active bleeding at present.  Fatigue remains and has been chronic for her.  -Unfortunately I do not have her labs available from today at this time; they were drawn via her port during this visit. We will call her with the results. -Her hemoglobin has remained relatively steady in the 10 g/dL range in the past few months. Continue to monitor.  Vitamin B12 deficiency:  -Continue monthly B12 injections (administered at home).   Port-a-cath maintenance:  -Continue port flushes every 2 months per protocol.   Dispo:  -Return to cancer center in 4 months with labs.    All questions were answered to patient's stated satisfaction. Encouraged patient to call with any new concerns or questions before her next visit to the  cancer center and we can certain see her sooner, if needed.    Orders placed this encounter:  Orders Placed This Encounter  Procedures  . CBC with Differential  . Comprehensive metabolic panel  . Iron and TIBC  . Ferritin  . Vitamin B12  . Folate   Twana First, MD

## 2017-04-29 ENCOUNTER — Other Ambulatory Visit (HOSPITAL_COMMUNITY): Payer: Self-pay | Admitting: Adult Health

## 2017-04-29 ENCOUNTER — Other Ambulatory Visit (HOSPITAL_COMMUNITY): Payer: Self-pay

## 2017-04-29 DIAGNOSIS — E875 Hyperkalemia: Secondary | ICD-10-CM

## 2017-04-29 MED ORDER — SODIUM POLYSTYRENE SULFONATE 15 GM/60ML PO SUSP: 15.0000 g | Freq: Once | ORAL | 0 refills | Status: AC

## 2017-05-06 ENCOUNTER — Encounter (HOSPITAL_COMMUNITY): Payer: Managed Care, Other (non HMO) | Attending: Oncology

## 2017-05-06 DIAGNOSIS — D500 Iron deficiency anemia secondary to blood loss (chronic): Secondary | ICD-10-CM | POA: Diagnosis not present

## 2017-05-06 DIAGNOSIS — E875 Hyperkalemia: Secondary | ICD-10-CM | POA: Diagnosis not present

## 2017-05-06 DIAGNOSIS — E539 Vitamin B deficiency, unspecified: Secondary | ICD-10-CM | POA: Diagnosis not present

## 2017-05-06 LAB — BASIC METABOLIC PANEL
ANION GAP: 7 (ref 5–15)
BUN: 27 mg/dL — AB (ref 6–20)
CO2: 25 mmol/L (ref 22–32)
Calcium: 8.1 mg/dL — ABNORMAL LOW (ref 8.9–10.3)
Chloride: 108 mmol/L (ref 101–111)
Creatinine, Ser: 1.47 mg/dL — ABNORMAL HIGH (ref 0.44–1.00)
GFR calc non Af Amer: 34 mL/min — ABNORMAL LOW (ref >60)
GFR, EST AFRICAN AMERICAN: 40 mL/min — AB (ref >60)
Glucose, Bld: 169 mg/dL — ABNORMAL HIGH (ref 65–99)
Potassium: 5.8 mmol/L — ABNORMAL HIGH (ref 3.5–5.1)
SODIUM: 140 mmol/L (ref 135–145)

## 2017-05-06 LAB — CBC WITH DIFFERENTIAL/PLATELET
Basophils Absolute: 0 10*3/uL (ref 0.0–0.1)
Basophils Relative: 1 %
EOS PCT: 4 %
Eosinophils Absolute: 0.3 10*3/uL (ref 0.0–0.7)
HCT: 35 % — ABNORMAL LOW (ref 36.0–46.0)
HEMOGLOBIN: 11 g/dL — AB (ref 12.0–15.0)
LYMPHS ABS: 1.5 10*3/uL (ref 0.7–4.0)
LYMPHS PCT: 17 %
MCH: 27.8 pg (ref 26.0–34.0)
MCHC: 31.4 g/dL (ref 30.0–36.0)
MCV: 88.4 fL (ref 78.0–100.0)
Monocytes Absolute: 0.7 10*3/uL (ref 0.1–1.0)
Monocytes Relative: 8 %
Neutro Abs: 6.1 10*3/uL (ref 1.7–7.7)
Neutrophils Relative %: 70 %
Platelets: 261 10*3/uL (ref 150–400)
RBC: 3.96 MIL/uL (ref 3.87–5.11)
RDW: 13.9 % (ref 11.5–15.5)
WBC: 8.7 10*3/uL (ref 4.0–10.5)

## 2017-05-06 LAB — FERRITIN: FERRITIN: 96 ng/mL (ref 11–307)

## 2017-05-06 LAB — IRON AND TIBC
Iron: 57 ug/dL (ref 28–170)
Saturation Ratios: 19 % (ref 10.4–31.8)
TIBC: 304 ug/dL (ref 250–450)
UIBC: 247 ug/dL

## 2017-05-06 LAB — VITAMIN B12: Vitamin B-12: 432 pg/mL (ref 180–914)

## 2017-05-06 LAB — FOLATE: Folate: 12.5 ng/mL (ref >5.9)

## 2017-06-11 ENCOUNTER — Other Ambulatory Visit (HOSPITAL_COMMUNITY): Payer: Self-pay

## 2017-06-11 DIAGNOSIS — E539 Vitamin B deficiency, unspecified: Secondary | ICD-10-CM

## 2017-06-11 MED ORDER — CYANOCOBALAMIN 1000 MCG/ML IJ SOLN: INTRAMUSCULAR | 2 refills | Status: DC

## 2017-06-11 NOTE — Telephone Encounter (Signed)
Received refill request from patients pharmacy for Vitamin B12 IM.  Reviewed with provider, chart checked and refilled.

## 2017-06-20 NOTE — Progress Notes (Signed)
Cardiology Office Note  Date: 06/23/2017   ID: Rodneisha, Bonnet [birth year], MRN [id number]  PCP: [name]., MD  Primary Cardiologist: [name], MD   Chief Complaint  Patient presents with  . Diastolic heart failure    History of Present Illness: ANNAMAY LAYMON is a 71 y.o. female last seen in July.  She presents for a routine follow-up visit.  Since last encounter her weight has increased steadily.  She reports compliance with Lasix, currently 60 mg daily.  I reviewed her recent lab work, she has renal insufficiency and hyperkalemia, presently not on any potassium supplement.  She does not report any chest pain or palpitations.  We discussed salt restriction, also increasing her Lasix dose with follow-up BMET.  She is in the process of considering establishing with a different PCP in light of Dr. Rayna Sexton restricted hours and pending retirement.  Echocardiogram from October 2017 revealed LVEF 60-65% with grade 2 diastolic dysfunction, PASP 43 mmHg.  Past Medical History:  Diagnosis Date  . CAD (coronary artery disease)    Nonobstructive at cardiac catheterization 2000  . Chronic back pain   . COPD (chronic obstructive pulmonary disease) ([place])   . Degenerative disc disease   . Essential hypertension, benign   . GERD (gastroesophageal reflux disease)   . Gout   . Hiatal hernia 07/27/2013  . History of diverticulitis of colon   . History of hiatal hernia   . Iron deficiency anemia   . Irritable bowel syndrome   . Lumbar radiculopathy   . Mixed hyperlipidemia   . Neuropathy   . Ovarian cancer ([place]) 04/02/2016  . Sleep apnea   . Type 2 diabetes mellitus ([place])   . Vitamin B deficiency 12/25/2009  . Vitamin B12 deficiency     Past Surgical History:  Procedure Laterality Date  . ABDOMINAL HYSTERECTOMY    . BACK SURGERY    . Benign breast cysts    . CHOLECYSTECTOMY    . COLONOSCOPY  10/01/2006   SLF:Pan colonic diverticulosis and moderate  internal hemorrhoids/ Otherwise no polyps, masses, inflammatory changes or AVMs/  . COLONOSCOPY  2011   SLF: pancolonic diverticulosis, large internal hemorrhoids  . COLONOSCOPY N/A 01/26/2016   Performed by [name], MD at [facility]  . ESOPHAGOGASTRODUODENOSCOPY  11/19/2006   SLF: Large hiatal hernia without evidence of Cameron ulcers/. Distal esophageal stricture, which allowed the gastroscope to pass without resistance.  A 16 mm Savary later passed with mild resistance/ Normal stomach.sb bx negative  . ESOPHAGOGASTRODUODENOSCOPY  10/01/2006   ZCH:YIFOY hiatal hernia.  Distal esophagus without evidence of   erythema, ulceration or Barrett's esophagus  . ESOPHAGOGASTRODUODENOSCOPY  2011   SLF: large hh, distal esophageal web narrowing to 12m s/p dilation to 149m . ESOPHAGOGASTRODUODENOSCOPY (EGD) N/A 08/06/2013   [facility]. GIVENS CAPSULE STUDY N/A 08/06/2013   [facility]   . PARTIAL HYSTERECTOMY  1978  . small bowel capsule  2008   negative  . TONSILLECTOMY AND ADENOIDECTOMY    . Two back surgeries/fusion    . UMBILICAL HERNIA REPAIR  2010    Current Outpatient Medications  Medication Sig Dispense Refill  . albuterol (PROVENTIL) (2.5 MG/3ML) 0.083% nebulizer solution Take 3 mLs (2.5 mg total) by nebulization every 6 (six) hours as needed for wheezing or shortness of breath. 75 mL 12  .  allopurinol (ZYLOPRIM) 100 MG tablet Take 1 tablet by mouth 2 (two) times daily.    Marland Kitchen amitriptyline (ELAVIL) 100 MG tablet Take 100 mg by mouth at bedtime.      Marland Kitchen aspirin EC 81 MG tablet Take 81 mg by mouth daily.    Marland Kitchen atorvastatin (LIPITOR) 40 MG tablet Take 1 tablet by mouth at bedtime.     . B-D ULTRA-FINE 33 LANCETS MISC USE TO CHECK BG 3 TIMES A DAY    . BIOTIN PO Take 1 capsule by mouth daily.     . cyanocobalamin (,VITAMIN B-12,) 1000 MCG/ML injection INJECT 1 ML INTRAMUSCULARLY EVERY  30 DAYS 3 mL 2  . esomeprazole (NEXIUM) 40 MG capsule TAKE 1 CAPSULE TWICE A DAY 30 MINUTES PRIOR TO MEALS 180 capsule 2  . famotidine (PEPCID) 40 MG tablet TAKE 1 TABLET AT BEDTIME AS NEEDED TO CONTROL REFLUX (Patient taking differently: Take 40 mg by mouth at bedtime as needed for heartburn or indigestion. ) 90 tablet 1  . fluticasone furoate-vilanterol (BREO ELLIPTA) 100-25 MCG/INH AEPB Inhale 1 puff into the lungs daily.    . furosemide (LASIX) 80 MG tablet Take 1 tablet (80 mg total) daily by mouth. 90 tablet 1  . insulin lispro (HUMALOG) 100 UNIT/ML KiwkPen Use as directed Kerr by sliding scale with meals. MDD=50 units    . LANTUS SOLOSTAR 100 UNIT/ML SOPN Inject 26 Units into the skin at bedtime.     Marland Kitchen linaclotide (LINZESS) 72 MCG capsule Take 1 capsule (72 mcg total) by mouth daily before breakfast. 90 capsule 3  . loperamide (IMODIUM) 2 MG capsule Take 2 mg by mouth as needed for diarrhea or loose stools.    . meclizine (ANTIVERT) 25 MG tablet Take 1 tablet (25 mg total) by mouth 3 (three) times daily as needed for dizziness. 30 tablet 0  . metFORMIN (GLUCOPHAGE) 500 MG tablet Take 1,000 mg by mouth 2 (two) times daily.    . metoprolol succinate (TOPROL-XL) 25 MG 24 hr tablet Take 50 mg by mouth daily.     . Misc. Devices MISC 3cc , 25G x 1" VanishPoint Syringe for Vitamin B12 injections monthly 12 each 0  . ondansetron (ZOFRAN) 4 MG tablet Take 1 tablet (4 mg total) by mouth every 8 (eight) hours as needed for nausea or vomiting. 45 tablet 2  . telmisartan (MICARDIS) 80 MG tablet Take 80 mg by mouth daily.    Marland Kitchen tiotropium (SPIRIVA) 18 MCG inhalation capsule Place 18 mcg into inhaler and inhale daily.    . valsartan (DIOVAN) 160 MG tablet Take 160 mg by mouth daily.     No current facility-administered medications for this visit.    Allergies:  Patient has no known allergies.   Social History: The patient  reports that she quit smoking about 55 years ago. Her smoking use included  cigarettes. She started smoking about 56 years ago. She has a 1.00 pack-year smoking history. she has never used smokeless tobacco. She reports that she does not drink alcohol or use drugs.   ROS:  Please see the history of present illness. Otherwise, complete review of systems is positive for none.  All other systems are reviewed and negative.   Physical Exam: VS:  BP (!) 152/72   Pulse 91   Ht _0  (1.499 m)   Wt 231 lb 3.2 oz (104.9 kg)   SpO2 95%   BMI 46.70 kg/m , BMI Body mass index is 46.7 kg/m.  Wt Readings from  Last 3 Encounters:  06/23/17 231 lb 3.2 oz (104.9 kg)  04/28/17 215 lb (97.5 kg)  04/03/17 228 lb 3.2 oz (103.5 kg)    General: Obese woman, appears comfortable at rest. HEENT: Conjunctiva and lids normal, oropharynx clear. Neck: Supple, no elevated JVP or carotid bruits, no thyromegaly. Lungs: Clear to auscultation, nonlabored breathing at rest. Cardiac: Regular rate and rhythm, S4, no significant systolic murmur, no pericardial rub. Abdomen: Soft, nontender, bowel sounds present, no guarding or rebound. Extremities: Comparing leg edema and lymphedema, distal pulses 2+.  ECG: I personally reviewed the tracing from 05/08/2016 which showed sinus tachycardia with nonspecific ST changes.  Recent Labwork: 04/28/2017: ALT 12; AST 15 05/06/2017: BUN 27; Creatinine, Ser 1.47; Hemoglobin 11.0; Platelets 261; Potassium 5.8; Sodium 140   Other Studies Reviewed Today:  Echocardiogram 05/10/2016: Study Conclusions  - Left ventricle: The cavity size was normal. Wall thickness was   increased in a pattern of mild LVH. There was moderate focal   basal hypertrophy of the septum. Systolic function was normal.   The estimated ejection fraction was in the range of 60% to 65%.   Wall motion was normal; there were no regional wall motion   abnormalities. Features are consistent with a pseudonormal left   ventricular filling pattern, with concomitant abnormal relaxation   and  increased filling pressure (grade 2 diastolic dysfunction). - Aortic valve: Mildly calcified annulus. Trileaflet; mildly   calcified leaflets. There was trivial regurgitation. Mean   gradient (S): 5 mm Hg. Valve area (Vmax): 1.92 cm^2. - Mitral valve: Calcified annulus. Mildly thickened leaflets .   There was mild regurgitation. - Left atrium: The atrium was moderately dilated. - Right atrium: Central venous pressure (est): 3 mm Hg. - Tricuspid valve: There was trivial regurgitation. - Pulmonary arteries: PA peak pressure: 43 mm Hg (S). - Pericardium, extracardiac: Small circumferential pericardial   effusion, possibly somewhat larger collection posteriorly.  Impressions:  - Mild LVH with LVEF 60-65%. Grade 2 diastolic dysfunction with   increased LV filling pressure. Moderate left atrial enlargement.   MAC with mildly thickened and calcified mitral leaflets, mild   mitral regurgitation. Sclerotic aortic valve with trivial aortic   regurgitation. Trivial tricuspid regurgitation with PASP 43 mmHg.   Small circumferential pericardial effusion, possibly somewhat   larger collection posteriorly.  Assessment and Plan:  1.  Chronic diastolic heart failure as well as leg edema and lymphedema.  Weight is up compared to the previous visit.  Increase Lasix to 80 mg daily, follow-up BMET. We discussed salt restriction as well.  2.  Nonobstructive CAD, no active angina symptoms.  Continue aspirin and statin.  3.  Essential hypertension, currently on Diovan and Toprol-XL.  Last BMET indicated renal insufficiency with hyperkalemia, not on potassium supplement.  May need to stop ARB consider nephrology consultation.  4.  CKD stage III, last creatinine 1.47.  Current medicines were reviewed with the patient today.   Orders Placed This Encounter  Procedures  . Basic metabolic panel    Disposition: Follow-up in 4 months.  Signed, Satira Sark, MD, [facility] 06/23/2017 4:20 PM    [facility] [place], [place], [place] [address] Phone: [phone]; Fax: [phone]

## 2017-06-23 ENCOUNTER — Ambulatory Visit: Payer: Managed Care, Other (non HMO) | Admitting: Cardiology

## 2017-06-23 ENCOUNTER — Ambulatory Visit (INDEPENDENT_AMBULATORY_CARE_PROVIDER_SITE_OTHER): Payer: Managed Care, Other (non HMO) | Admitting: Cardiology

## 2017-06-23 ENCOUNTER — Encounter: Payer: Self-pay | Admitting: Cardiology

## 2017-06-23 VITALS — BP 152/72 | HR 91 | Ht 59.0 in | Wt 231.2 lb

## 2017-06-23 DIAGNOSIS — N183 Chronic kidney disease, stage 3 unspecified: Secondary | ICD-10-CM

## 2017-06-23 DIAGNOSIS — I25810 Atherosclerosis of coronary artery bypass graft(s) without angina pectoris: Secondary | ICD-10-CM

## 2017-06-23 DIAGNOSIS — I10 Essential (primary) hypertension: Secondary | ICD-10-CM | POA: Diagnosis not present

## 2017-06-23 DIAGNOSIS — I5032 Chronic diastolic (congestive) heart failure: Secondary | ICD-10-CM

## 2017-06-23 MED ORDER — FUROSEMIDE 80 MG PO TABS: 80.0000 mg | ORAL_TABLET | Freq: Every day | ORAL | 1 refills | Status: DC

## 2017-06-23 NOTE — Patient Instructions (Signed)
Medication Instructions:  Your physician has recommended you make the following change in your medication:   INCREASE lasix to 80 mg daily  Please continue all other medications as prescribed  Labwork:  BMET- In 2 weeks  Orders given today  Testing/Procedures: NONE  Follow-Up: Your physician recommends that you schedule a follow-up appointment in: [place] DR. MCDOWELL  Any Other Special Instructions Will Be Listed Below (If Applicable).  If you need a refill on your cardiac medications before your next appointment, please call your pharmacy.

## 2017-06-30 ENCOUNTER — Encounter (HOSPITAL_COMMUNITY): Payer: Medicare Other | Attending: Hematology & Oncology

## 2017-06-30 ENCOUNTER — Encounter (HOSPITAL_COMMUNITY): Payer: Self-pay

## 2017-06-30 DIAGNOSIS — D509 Iron deficiency anemia, unspecified: Secondary | ICD-10-CM

## 2017-06-30 DIAGNOSIS — D500 Iron deficiency anemia secondary to blood loss (chronic): Secondary | ICD-10-CM | POA: Insufficient documentation

## 2017-06-30 DIAGNOSIS — Z452 Encounter for adjustment and management of vascular access device: Secondary | ICD-10-CM

## 2017-06-30 MED ORDER — SODIUM CHLORIDE 0.9% FLUSH
10.0000 mL | INTRAVENOUS | Status: DC | PRN
  Administered 2017-06-30: 10 mL via INTRAVENOUS
  Filled 2017-06-30: qty 10

## 2017-06-30 MED ORDER — HEPARIN SOD (PORK) LOCK FLUSH 100 UNIT/ML IV SOLN
500.0000 [IU] | Freq: Once | INTRAVENOUS | Status: AC
  Administered 2017-06-30: 500 [IU] via INTRAVENOUS

## 2017-06-30 MED ORDER — HEPARIN SOD (PORK) LOCK FLUSH 100 UNIT/ML IV SOLN
INTRAVENOUS | Status: AC
  Filled 2017-06-30: qty 5

## 2017-06-30 NOTE — Progress Notes (Signed)
[name] tolerated portacath flush well without complaints or incident. Port accessed with 20 gauge needle without blood return but flushed easily without complaints of pain or swelling. Port flushed with 10 ml NS and 5 ml Heparin per protocol. VSS Pt discharged via wheelchair in satisfactory condition accompanied by her husband

## 2017-06-30 NOTE — Patient Instructions (Signed)
[facility] Discharge Instructions  RECOMMENDATIONS MADE BY THE CONSULTANT AND ANY TEST RESULTS WILL BE SENT TO YOUR REFERRING PHYSICIAN.  Portacath flushed per protocol today. Follow-up as scheduled. Call clinic for any questions or concerns  Thank you for choosing [facility] at [facility] to provide your oncology and hematology care.  To afford each patient quality time with our provider, please arrive at least 15 minutes before your scheduled appointment time.    If you have a lab appointment with the Cancer Center please come in thru the  Main Entrance and check in at the main information desk  You need to re-schedule your appointment should you arrive 10 or more minutes late.  We strive to give you quality time with our providers, and arriving late affects you and other patients whose appointments are after yours.  Also, if you no show three or more times for appointments you may be dismissed from the clinic at the providers discretion.     Again, thank you for choosing [facility].  Our hope is that these requests will decrease the amount of time that you wait before being seen by our physicians.       _____________________________________________________________  Should you have questions after your visit to [facility], please contact our office at [phone] between the hours of 8:30 a.m. and 4:30 p.m.  Voicemails left after 4:30 p.m. will not be returned until the following business day.  For prescription refill requests, have your pharmacy contact our office.       Resources For Cancer Patients and their Caregivers ? American Cancer Society: Can assist with transportation, wigs, general needs, runs Look Good Feel Better.        1-888-227-6333 ? Cancer Care: Provides financial assistance, online support groups, medication/co-pay assistance.  1-800-813-HOPE (4673) ? [facility] Assists [place] cancer patients and their families through emotional , educational and financial support.  336-427-4357 ? [place] DSS Where to apply for food stamps, Medicaid and utility assistance. 336-342-1394 ? [facility]: Transportation to medical appointments. 336-347-2287 ? Social Security Administration: May apply for disability if have a Stage IV cancer. 336-342-7796 1-800-772-1213 ? [place] Aging, Disability and Transit Services: Assists with nutrition, care and transit needs. 336-349-2343  Cancer Center Support Programs: @[email or web address]@ > Cancer Support Group  2nd Tuesday of the month 1pm-2pm, Journey Room  > Creative Journey  3rd Tuesday of the month 1130am-1pm, Journey Room  > Look Good Feel Better  1st Wednesday of the month 10am-12 noon, Journey Room (Call American Cancer Society to register 1-800-395-5775)   

## 2017-07-11 ENCOUNTER — Other Ambulatory Visit: Payer: Self-pay | Admitting: Cardiology

## 2017-07-12 LAB — BASIC METABOLIC PANEL WITH GFR
BUN / CREAT RATIO: 23 (calc) — AB (ref 6–22)
BUN: 45 mg/dL — ABNORMAL HIGH (ref 7–25)
CO2: 29 mmol/L (ref 20–32)
CREATININE: 1.96 mg/dL — AB (ref 0.60–0.93)
Calcium: 7.5 mg/dL — ABNORMAL LOW (ref 8.6–10.4)
Chloride: 104 mmol/L (ref 98–110)
GFR, EST NON AFRICAN AMERICAN: 25 mL/min/{1.73_m2} — AB (ref >=60)
GFR, Est African American: 29 mL/min/{1.73_m2} — ABNORMAL LOW (ref >=60)
GLUCOSE: 174 mg/dL — AB (ref 65–139)
POTASSIUM: 5.3 mmol/L (ref 3.5–5.3)
SODIUM: 143 mmol/L (ref 135–146)

## 2017-07-16 ENCOUNTER — Telehealth: Payer: Self-pay

## 2017-07-16 DIAGNOSIS — N182 Chronic kidney disease, stage 2 (mild): Secondary | ICD-10-CM

## 2017-07-16 MED ORDER — FUROSEMIDE 80 MG PO TABS: 80.0000 mg | ORAL_TABLET | Freq: Every day | ORAL | 1 refills | Status: DC

## 2017-07-16 NOTE — Telephone Encounter (Signed)
-----   Message from Merlene Laughter, LPN sent at 22/48/2500  9:57 AM EST -----   ----- Message ----- From: Satira Sark, MD Sent: 07/16/2017   9:24 AM To: Merlene Laughter, LPN  Results reviewed.  Creatinine has bumped up from 1.47-1.96.  Potassium high normal at 5.3.  Please reconcile medications, make sure that she is not on two ARB's.  Change Lasix to 80 mg alternating with 60 mg every other day.  Please also refer for nephrology consultation with Dr. Lowanda Foster. A copy of this test should be forwarded to [name]., MD.

## 2017-07-16 NOTE — Telephone Encounter (Signed)
Patient notified. Routed to PCP 

## 2017-07-16 NOTE — Telephone Encounter (Signed)
LMTCB

## 2017-07-16 NOTE — Telephone Encounter (Signed)
-----   Message from Merlene Laughter, LPN sent at 72/04/1979  9:57 AM EST -----   ----- Message ----- From: Satira Sark, MD Sent: 07/16/2017   9:24 AM To: Merlene Laughter, LPN  Results reviewed.  Creatinine has bumped up from 1.47-1.96.  Potassium high normal at 5.3.  Please reconcile medications, make sure that she is not on two ARB's.  Change Lasix to 80 mg alternating with 60 mg every other day.  Please also refer for nephrology consultation with Dr. Lowanda Foster. A copy of this test should be forwarded to [name]., MD.

## 2017-07-17 ENCOUNTER — Encounter: Payer: Self-pay | Admitting: Family Medicine

## 2017-07-17 ENCOUNTER — Ambulatory Visit (INDEPENDENT_AMBULATORY_CARE_PROVIDER_SITE_OTHER): Payer: Managed Care, Other (non HMO) | Admitting: Family Medicine

## 2017-07-17 ENCOUNTER — Other Ambulatory Visit: Payer: Self-pay

## 2017-07-17 VITALS — BP 200/80 | HR 112 | Temp 97.4°F | Resp 24 | Ht 59.0 in | Wt 232.0 lb

## 2017-07-17 DIAGNOSIS — I10 Essential (primary) hypertension: Secondary | ICD-10-CM

## 2017-07-17 DIAGNOSIS — Z1231 Encounter for screening mammogram for malignant neoplasm of breast: Secondary | ICD-10-CM | POA: Diagnosis not present

## 2017-07-17 DIAGNOSIS — Z1159 Encounter for screening for other viral diseases: Secondary | ICD-10-CM

## 2017-07-17 DIAGNOSIS — E559 Vitamin D deficiency, unspecified: Secondary | ICD-10-CM

## 2017-07-17 DIAGNOSIS — E1142 Type 2 diabetes mellitus with diabetic polyneuropathy: Secondary | ICD-10-CM

## 2017-07-17 DIAGNOSIS — Z9989 Dependence on other enabling machines and devices: Secondary | ICD-10-CM | POA: Diagnosis not present

## 2017-07-17 DIAGNOSIS — G4733 Obstructive sleep apnea (adult) (pediatric): Secondary | ICD-10-CM | POA: Diagnosis not present

## 2017-07-17 DIAGNOSIS — E785 Hyperlipidemia, unspecified: Secondary | ICD-10-CM

## 2017-07-17 DIAGNOSIS — N183 Chronic kidney disease, stage 3 unspecified: Secondary | ICD-10-CM

## 2017-07-17 DIAGNOSIS — Z1239 Encounter for other screening for malignant neoplasm of breast: Secondary | ICD-10-CM

## 2017-07-17 MED ORDER — TELMISARTAN 80 MG PO TABS: 80.0000 mg | ORAL_TABLET | Freq: Every day | ORAL | 3 refills | Status: DC

## 2017-07-17 MED ORDER — MECLIZINE HCL 25 MG PO TABS: 25.0000 mg | ORAL_TABLET | Freq: Three times a day (TID) | ORAL | 0 refills | Status: DC | PRN

## 2017-07-17 NOTE — Patient Instructions (Signed)
Medicines refilled  Due for mammogram  Need records Dr Scotty Court  See me in 3 months

## 2017-07-17 NOTE — Progress Notes (Signed)
Chief Complaint  Patient presents with  . Hypertension  . Diabetes   This is a new patient to establish.  Her prior physician retired.  I will request his records. She has a complicated medical history. She is an insulin-dependent diabetic.  She is under the care of endocrinology.  She states her last hemoglobin A1c was under 7.  She is confused by the diabetic diet and follows simple portion control to control her carbohydrates and calories.  She does avoid sweets.  She unfortunately is morbidly obese.  She feels unable to lose weight because of her orthopedic difficulties. She is on appropriate blood pressure medication.  She is on an ARB.  Her blood pressure is well controlled. She is on lipid management with Lipitor.  She states that her LDL has been well controlled. Patient is followed by cardiology for congestive heart failure.  Cardiology notes are on the chart.  Because of her chronic congestive heart failure she tries not to eat salt.  She does not weigh herself daily.  She has significant edema.  There is difficulty in managing her diuresis because of kidney failure.  She recently increased her Lasix to 80 mg a day and her creatinine rose significantly.  She has been referred to nephrology. 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  She has had 2 lumbar surgeries.  She takes ibuprofen for her back pain.  I notified her that it is important she stop all nonsteroidal anti-inflammatory drugs especially with stage III chronic renal failure.  She can take acetaminophen up to 3 g a day. She has history of anemia, iron deficient and B 12.  She has a Port-A-Cath and is followed by oncology.  She gives herself B12 shots.  She has required transfusions. She has obstructive sleep apnea and is compliant with her CPAP. She has a new complaint of swelling and pain in her right axilla.  I do not palpate any abnormality.  She is overdue for a mammogram and so this is  ordered. Patient did have an eye exam yesterday.  She has cataracts and will be scheduled for surgery. We discussed the recommendation for hepatitis C screening even though she is a low risk individual.  She agrees to have this done.   Patient Active Problem List   Diagnosis Date Noted  . IBS (irritable bowel syndrome) 04/03/2017  . Gout 03/15/2017  . Acute on chronic diastolic CHF (congestive heart failure) ([place]) 05/08/2016  . CKD (chronic kidney disease) stage 3, GFR 30-59 ml/min (HCC) 05/08/2016  . Constipation due to slow transit 04/25/2016  . Peripheral vertigo 06/27/2015  . Port-A-Cath in place 10/06/2014  . Chronic obstructive pulmonary disease ([place]) 04/30/2014  . Morbid (severe) obesity due to excess calories ([place]) 04/30/2014  . Hemorrhoids, internal, with bleeding 11/10/2013  . Anemia 08/18/2013  . Acid reflux   . Obstructive chronic bronchitis without exacerbation ([place]) 12/26/2009  . DM type 2 with diabetic peripheral neuropathy ([place]) 12/25/2009  . Vitamin B deficiency 12/25/2009  . Iron deficiency anemia due to chronic blood loss 12/25/2009  . Essential hypertension, benign 12/25/2009  . DEGENERATIVE DISC DISEASE 12/25/2009  . OSA on CPAP 12/25/2009  . HLD (hyperlipidemia) 12/25/2009  . Controlled type 2 diabetes mellitus with diabetic polyneuropathy, with long-term current use of insulin ([place]) 12/25/2009    Outpatient Encounter Medications as of 07/17/2017  Medication Sig  . albuterol (PROVENTIL) (2.5 MG/3ML) 0.083% nebulizer solution Take 3 mLs (2.5 mg total) by nebulization every  6 (six) hours as needed for wheezing or shortness of breath.  . allopurinol (ZYLOPRIM) 100 MG tablet Take 1 tablet by mouth 2 (two) times daily.  Marland Kitchen amitriptyline (ELAVIL) 100 MG tablet Take 100 mg by mouth at bedtime.    Marland Kitchen aspirin EC 81 MG tablet Take 81 mg by mouth daily.  Marland Kitchen atorvastatin (LIPITOR) 40 MG tablet Take 1 tablet by mouth at bedtime.   . B-D ULTRA-FINE 33 LANCETS MISC USE TO  CHECK BG 3 TIMES A DAY  . BIOTIN PO Take 1 capsule by mouth daily.   . cyanocobalamin (,VITAMIN B-12,) 1000 MCG/ML injection INJECT 1 ML INTRAMUSCULARLY EVERY 30 DAYS  . esomeprazole (NEXIUM) 40 MG capsule TAKE 1 CAPSULE TWICE A DAY 30 MINUTES PRIOR TO MEALS  . famotidine (PEPCID) 40 MG tablet TAKE 1 TABLET AT BEDTIME AS NEEDED TO CONTROL REFLUX (Patient taking differently: Take 40 mg by mouth at bedtime as needed for heartburn or indigestion. )  . fluticasone furoate-vilanterol (BREO ELLIPTA) 100-25 MCG/INH AEPB Inhale 1 puff into the lungs daily.  . furosemide (LASIX) 80 MG tablet Take 1 tablet (80 mg total) by mouth daily. Take 80 mg alternating with 60 mg every other day  . insulin lispro (HUMALOG) 100 UNIT/ML KiwkPen Use as directed [place] by sliding scale with meals. MDD=50 units  . LANTUS SOLOSTAR 100 UNIT/ML SOPN Inject 20 Units into the skin at bedtime.   Marland Kitchen loperamide (IMODIUM) 2 MG capsule Take 2 mg by mouth as needed for diarrhea or loose stools.  . meclizine (ANTIVERT) 25 MG tablet Take 1 tablet (25 mg total) by mouth 3 (three) times daily as needed for dizziness.  . metFORMIN (GLUCOPHAGE) 500 MG tablet Take 1,000 mg by mouth 2 (two) times daily.  . metoprolol succinate (TOPROL-XL) 50 MG 24 hr tablet Take 50 mg by mouth daily. Take with or immediately following a meal.  . Misc. Devices MISC 3cc , 25G x 1" VanishPoint Syringe for Vitamin B12 injections monthly  . telmisartan (MICARDIS) 80 MG tablet Take 1 tablet (80 mg total) by mouth daily.  Marland Kitchen tiotropium (SPIRIVA) 18 MCG inhalation capsule Place 18 mcg into inhaler and inhale daily.   No facility-administered encounter medications on file as of 07/17/2017.     Past Medical History:  Diagnosis Date  . Blood transfusion without reported diagnosis   . CAD (coronary artery disease)    Nonobstructive at cardiac catheterization 2000  . Cataract   . CHF (congestive heart failure) ([place])   . Chronic back pain   . Chronic kidney disease    . COPD (chronic obstructive pulmonary disease) ([place])   . Degenerative disc disease   . Essential hypertension, benign   . GERD (gastroesophageal reflux disease)   . Gout   . Hiatal hernia 07/27/2013  . History of diverticulitis of colon   . History of hiatal hernia   . Iron deficiency anemia   . Irritable bowel syndrome   . Lumbar radiculopathy   . Mixed hyperlipidemia   . Neuropathy   . Osteoporosis   . Ovarian cancer ([place]) 04/02/2016  . Oxygen deficiency   . Sleep apnea   . Type 2 diabetes mellitus ([place])   . Vitamin B deficiency 12/25/2009  . Vitamin B12 deficiency     Past Surgical History:  Procedure Laterality Date  . ABDOMINAL HYSTERECTOMY    . BACK SURGERY    . Benign breast cysts    . CHOLECYSTECTOMY    . COLONOSCOPY  10/01/2006   SLF:Pan  colonic diverticulosis and moderate internal hemorrhoids/ Otherwise no polyps, masses, inflammatory changes or AVMs/  . COLONOSCOPY  2011   SLF: pancolonic diverticulosis, large internal hemorrhoids  . COLONOSCOPY N/A 01/26/2016   Procedure: COLONOSCOPY;  Surgeon: [name], MD;  Location: [facility];  Service: Endoscopy;  Laterality: N/A;  830   . ESOPHAGOGASTRODUODENOSCOPY  11/19/2006   SLF: Large hiatal hernia without evidence of [place] ulcers/. Distal esophageal stricture, which allowed the gastroscope to pass without resistance.  A 16 mm Savary later passed with mild resistance/ Normal stomach.sb bx negative  . ESOPHAGOGASTRODUODENOSCOPY  10/01/2006   CHY:IFOYD hiatal hernia.  Distal esophagus without evidence of   erythema, ulceration or Barrett's esophagus  . ESOPHAGOGASTRODUODENOSCOPY  2011   SLF: large hh, distal esophageal web narrowing to 68mm s/p dilation to 99mm  . ESOPHAGOGASTRODUODENOSCOPY N/A 08/06/2013   SLF: 1. Stricture at the gastroesophageal junction 2. large hiatal hernia. 3. Mild erosive gastritis.  Marland Kitchen GIVENS CAPSULE STUDY N/A 08/06/2013   INCOMPLETE-SMALL BOWLE ULCERS  . KNEE SURGERY Right   . PARTIAL  HYSTERECTOMY  1978  . small bowel capsule  2008   negative  . SPINE SURGERY    . TONSILLECTOMY AND ADENOIDECTOMY    . Two back surgeries/fusion    . UMBILICAL HERNIA REPAIR  2010    Social History   Socioeconomic History  . Marital status: Married    Spouse name: Tatro  . Number of children: 4  . Years of education: Not on file  . Highest education level: 10th grade  Social Needs  . Financial resource strain: Not on file  . Food insecurity - worry: Not on file  . Food insecurity - inability: Not on file  . Transportation needs - medical: Not on file  . Transportation needs - non-medical: Not on file  Occupational History  . Occupation: retired    Comment: Ambulance person, [employer] work  Tobacco Use  . Smoking status: Former Smoker    Packs/day: 1.00    Years: 1.00    Pack years: 1.00    Types: Cigarettes    Start date: 02/19/1961    Last attempt to quit: 08/05/1961    Years since quitting: 55.9  . Smokeless tobacco: Never Used  . Tobacco comment: Quit smoking x 50 years  Substance and Sexual Activity  . Alcohol use: No  . Drug use: No  . Sexual activity: Not Currently  Other Topics Concern  . Not on file  Social History Narrative   HAS 4 SON-GRAND [place].   Lives in home with Cong - married 13 Y   Cook, sew, quilt, chochet    Family History  Problem Relation Age of Onset  . Colon cancer Brother        diagnosed age 27. Living.   Marland Kitchen Ulcers Sister   . Diabetes Sister   . Heart attack Sister   . Kidney failure Sister   . Stroke Sister   . Ulcers Mother   . Diabetes Mother   . Heart attack Mother   . Stroke Mother   . Asthma Mother   . Heart disease Mother   . Heart attack Brother   . Heart disease Brother   . Asthma Sister   . Diabetes Brother   . Stroke Maternal Grandmother   . Heart attack Maternal Grandmother   . Heart attack Other   . Early death Father        MVA in his 53s  Review of Systems  Constitutional: Negative for  chills, fever and weight loss.  HENT: Negative for congestion and hearing loss.   Eyes: Positive for blurred vision. Negative for pain.       Known cataracts  Respiratory: Positive for shortness of breath. Negative for cough.        Congestive heart failure, COPD, obesity  Cardiovascular: Positive for leg swelling. Negative for chest pain.       Chronic edema from congestive heart failure  Gastrointestinal: Negative for abdominal pain, constipation and diarrhea.       Controlled with famotidine and esomeprazole  Genitourinary: Positive for frequency. Negative for dysuria.  Musculoskeletal: Positive for back pain and neck pain. Negative for falls, joint pain and myalgias.  Neurological: Positive for dizziness. Negative for seizures and headaches.       Episodic vertigo  Psychiatric/Behavioral: Negative for depression. The patient is not nervous/anxious and does not have insomnia.     BP (!) 200/80 (BP Location: Right Arm, Patient Position: Sitting, Cuff Size: Large)   Pulse (!) 112   Temp (!) 97.4 F (36.3 C) (Temporal)   Resp (!) 24   Ht 4\' 11"  (1.499 m)   Wt 232 lb (105.2 kg)   SpO2 93%   BMI 46.86 kg/m   Physical Exam  Constitutional: She appears well-developed.  Over nourished, morbid obesity  HENT:  Head: Normocephalic and atraumatic.  Mouth/Throat: Oropharynx is clear and moist.  Eyes: Conjunctivae are normal. Pupils are equal, round, and reactive to light.  Neck: Normal range of motion. Neck supple.  Palpation normal, difficult because of adiposity  Cardiovascular: Regular rhythm and normal heart sounds.  Tachycardia  Pulmonary/Chest: Effort normal.  Decreased breath sounds, no rales or wheeze  Abdominal: Soft. Bowel sounds are normal.  Musculoskeletal: She exhibits edema.  Large amount pitting edema, dry skin  Psychiatric: She has a normal mood and affect. Her behavior is normal.     ASSESSMENT/PLAN:   1. Essential hypertension, benign Controlled  2. DM  type 2 with diabetic peripheral neuropathy ([place]) Controlled by history - Hemoglobin A1c - COMPLETE METABOLIC PANEL WITH GFR - Lipid panel  3. CKD (chronic kidney disease) stage 3, GFR 30-59 ml/min (HCC) Referred to neurology  4. Encounter for hepatitis C screening test for low risk patient Discussed - Hepatitis C antibody  5. Vitamin D deficiency History - VITAMIN D 25 Hydroxy (Vit-D Deficiency, Fractures)  6. Breast cancer screening Overdue for screening.  Questionable swelling and pain in right axilla - MM Digital Screening; Future  7. OSA on CPAP Compliant  8. Hyperlipidemia, unspecified hyperlipidemia type Compliant with statin.  Will check lipid panel Greater than 50% of this visit was spent in counseling and coordinating care.  Total face to face time:   60 minutes.  Reviewed complicated medical history, the medical history for each medical problem.  Old records.  Discussed benefits of attempted weight loss.  Discussed management congestive heart failure, daily weights, salt reduction   Patient Instructions  Medicines refilled  Due for mammogram  Need records Dr Scotty Court  See me in 3 months   [name], MD

## 2017-07-21 ENCOUNTER — Other Ambulatory Visit: Payer: Self-pay

## 2017-07-21 ENCOUNTER — Other Ambulatory Visit: Payer: Self-pay | Admitting: Gastroenterology

## 2017-07-21 MED ORDER — MECLIZINE HCL 25 MG PO TABS: 25.0000 mg | ORAL_TABLET | Freq: Three times a day (TID) | ORAL | 0 refills | Status: DC | PRN

## 2017-07-25 ENCOUNTER — Ambulatory Visit (HOSPITAL_COMMUNITY)
Admission: RE | Admit: 2017-07-25 | Discharge: 2017-07-25 | Disposition: A | Discharge status: Home / Self Care | Payer: Managed Care, Other (non HMO) | Source: Ambulatory Visit | Attending: Family Medicine | Admitting: Family Medicine

## 2017-07-25 DIAGNOSIS — Z1239 Encounter for other screening for malignant neoplasm of breast: Secondary | ICD-10-CM

## 2017-07-25 DIAGNOSIS — Z1231 Encounter for screening mammogram for malignant neoplasm of breast: Secondary | ICD-10-CM | POA: Diagnosis not present

## 2017-08-06 ENCOUNTER — Encounter: Payer: Self-pay | Admitting: Gastroenterology

## 2017-08-28 ENCOUNTER — Other Ambulatory Visit: Payer: Self-pay

## 2017-08-28 ENCOUNTER — Inpatient Hospital Stay (HOSPITAL_COMMUNITY): Payer: Managed Care, Other (non HMO) | Attending: Adult Health | Admitting: Internal Medicine

## 2017-08-28 ENCOUNTER — Inpatient Hospital Stay (HOSPITAL_COMMUNITY): Payer: Managed Care, Other (non HMO)

## 2017-08-28 ENCOUNTER — Encounter (HOSPITAL_COMMUNITY): Payer: Self-pay

## 2017-08-28 ENCOUNTER — Encounter (HOSPITAL_COMMUNITY): Payer: Self-pay | Admitting: Internal Medicine

## 2017-08-28 VITALS — BP 146/64 | HR 96 | Temp 98.3°F | Resp 18 | Ht <= 58 in | Wt 220.0 lb

## 2017-08-28 DIAGNOSIS — D500 Iron deficiency anemia secondary to blood loss (chronic): Secondary | ICD-10-CM

## 2017-08-28 DIAGNOSIS — I129 Hypertensive chronic kidney disease with stage 1 through stage 4 chronic kidney disease, or unspecified chronic kidney disease: Secondary | ICD-10-CM

## 2017-08-28 DIAGNOSIS — Z79899 Other long term (current) drug therapy: Secondary | ICD-10-CM

## 2017-08-28 DIAGNOSIS — E539 Vitamin B deficiency, unspecified: Secondary | ICD-10-CM

## 2017-08-28 DIAGNOSIS — D509 Iron deficiency anemia, unspecified: Secondary | ICD-10-CM | POA: Diagnosis present

## 2017-08-28 DIAGNOSIS — E1122 Type 2 diabetes mellitus with diabetic chronic kidney disease: Secondary | ICD-10-CM

## 2017-08-28 DIAGNOSIS — E785 Hyperlipidemia, unspecified: Secondary | ICD-10-CM | POA: Diagnosis not present

## 2017-08-28 DIAGNOSIS — N189 Chronic kidney disease, unspecified: Secondary | ICD-10-CM | POA: Diagnosis not present

## 2017-08-28 DIAGNOSIS — E538 Deficiency of other specified B group vitamins: Secondary | ICD-10-CM | POA: Diagnosis not present

## 2017-08-28 DIAGNOSIS — J449 Chronic obstructive pulmonary disease, unspecified: Secondary | ICD-10-CM | POA: Insufficient documentation

## 2017-08-28 LAB — FOLATE: FOLATE: 9.1 ng/mL (ref >5.9)

## 2017-08-28 LAB — COMPREHENSIVE METABOLIC PANEL
ALBUMIN: 3.3 g/dL — AB (ref 3.5–5.0)
ALT: 9 U/L — ABNORMAL LOW (ref 14–54)
AST: 12 U/L — AB (ref 15–41)
Alkaline Phosphatase: 104 U/L (ref 38–126)
Anion gap: 9 (ref 5–15)
BUN: 28 mg/dL — AB (ref 6–20)
CHLORIDE: 109 mmol/L (ref 101–111)
CO2: 21 mmol/L — AB (ref 22–32)
Calcium: 8.3 mg/dL — ABNORMAL LOW (ref 8.9–10.3)
Creatinine, Ser: 1.64 mg/dL — ABNORMAL HIGH (ref 0.44–1.00)
GFR calc Af Amer: 35 mL/min — ABNORMAL LOW (ref >60)
GFR, EST NON AFRICAN AMERICAN: 30 mL/min — AB (ref >60)
Glucose, Bld: 170 mg/dL — ABNORMAL HIGH (ref 65–99)
POTASSIUM: 5.2 mmol/L — AB (ref 3.5–5.1)
SODIUM: 139 mmol/L (ref 135–145)
Total Bilirubin: 0.7 mg/dL (ref 0.3–1.2)
Total Protein: 7 g/dL (ref 6.5–8.1)

## 2017-08-28 LAB — VITAMIN B12: VITAMIN B 12: 451 pg/mL (ref 180–914)

## 2017-08-28 LAB — FERRITIN: FERRITIN: 97 ng/mL (ref 11–307)

## 2017-08-28 LAB — CBC WITH DIFFERENTIAL/PLATELET
BASOS ABS: 0 10*3/uL (ref 0.0–0.1)
BASOS PCT: 0 %
EOS ABS: 0.3 10*3/uL (ref 0.0–0.7)
EOS PCT: 4 %
HCT: 35.9 % — ABNORMAL LOW (ref 36.0–46.0)
Hemoglobin: 11 g/dL — ABNORMAL LOW (ref 12.0–15.0)
Lymphocytes Relative: 21 %
Lymphs Abs: 1.4 10*3/uL (ref 0.7–4.0)
MCH: 27.5 pg (ref 26.0–34.0)
MCHC: 30.6 g/dL (ref 30.0–36.0)
MCV: 89.8 fL (ref 78.0–100.0)
Monocytes Absolute: 0.6 10*3/uL (ref 0.1–1.0)
Monocytes Relative: 8 %
Neutro Abs: 4.7 10*3/uL (ref 1.7–7.7)
Neutrophils Relative %: 67 %
PLATELETS: 256 10*3/uL (ref 150–400)
RBC: 4 MIL/uL (ref 3.87–5.11)
RDW: 13 % (ref 11.5–15.5)
WBC: 7 10*3/uL (ref 4.0–10.5)

## 2017-08-28 LAB — IRON AND TIBC
Iron: 67 ug/dL (ref 28–170)
SATURATION RATIOS: 23 % (ref 10.4–31.8)
TIBC: 290 ug/dL (ref 250–450)
UIBC: 223 ug/dL

## 2017-08-28 MED ORDER — SODIUM CHLORIDE 0.9% FLUSH
10.0000 mL | INTRAVENOUS | Status: DC | PRN
  Administered 2017-08-28: 10 mL via INTRAVENOUS
  Filled 2017-08-28: qty 10

## 2017-08-28 MED ORDER — HEPARIN SOD (PORK) LOCK FLUSH 100 UNIT/ML IV SOLN
500.0000 [IU] | Freq: Once | INTRAVENOUS | Status: AC
  Administered 2017-08-28: 500 [IU] via INTRAVENOUS

## 2017-08-28 NOTE — Patient Instructions (Signed)
[place] at [facility] Discharge Instructions  RECOMMENDATIONS MADE BY THE CONSULTANT AND ANY TEST RESULTS WILL BE SENT TO YOUR REFERRING PHYSICIAN.  You had your port flushed today and labs drawn We will call you with the results. Follow up as scheduled.  Thank you for choosing [place] at [facility] to provide your oncology and hematology care.  To afford each patient quality time with our provider, please arrive at least 15 minutes before your scheduled appointment time.    If you have a lab appointment with the [facility] please come in thru the  Main Entrance and check in at the main information desk  You need to re-schedule your appointment should you arrive 10 or more minutes late.  We strive to give you quality time with our providers, and arriving late affects you and other patients whose appointments are after yours.  Also, if you no show three or more times for appointments you may be dismissed from the clinic at the providers discretion.     Again, thank you for choosing [facility].  Our hope is that these requests will decrease the amount of time that you wait before being seen by our physicians.       _____________________________________________________________  Should you have questions after your visit to [facility], please contact our office at [phone] between the hours of 8:30 a.m. and 4:30 p.m.  Voicemails left after 4:30 p.m. will not be returned until the following business day.  For prescription refill requests, have your pharmacy contact our office.       Resources For Cancer Patients and their Caregivers ? American Cancer Society: Can assist with transportation, wigs, general needs, runs Look Good Feel Better.        256-828-5469 ? Cancer Care: Provides financial assistance, online support groups, medication/co-pay assistance.  1-800-813-HOPE 718-135-2838) ? [place] Assists [place] cancer patients and their families through emotional , educational and financial support.  (587) 771-5451 ? [place] DSS Where to apply for food stamps, Medicaid and utility assistance. 289-255-2530 ? [facility]: Transportation to medical appointments. 567-321-9153 ? Social Security Administration: May apply for disability if have a Stage IV cancer. 7138453585 9737648468 ? [employer], Disability and Transit Services: Assists with nutrition, care and transit needs. [place] Support Programs: @10RELATIVEDAYS @ > Cancer Support Group  2nd Tuesday of the month 1pm-2pm, Journey Room  > Creative Journey  3rd Tuesday of the month 1130am-1pm, Journey Room  > Look Good Feel Better  1st Wednesday of the month 10am-12 noon, Journey Room (Call [place] to register [phone])

## 2017-08-28 NOTE — Progress Notes (Signed)
[name] presented for Portacath access and flush. Portacath located right chest wall accessed with  H 20 needle. Good blood return present and labs drawn from port. Portacath flushed with 42ml NS and 500U/78ml Heparin and needle removed intact. Procedure without incident. Patient tolerated procedure well. Patient discharged in stable condition via wheelchair with spouse. Patient to follow up as scheduled.

## 2017-08-28 NOTE — Addendum Note (Signed)
Addended by: Donetta Potts on: 08/28/2017 11:34 AM   Modules accepted: Orders

## 2017-08-28 NOTE — Progress Notes (Signed)
HEM/ONC FOLLOW UP NOTE [name] [facility]) Date of office visit: 08/28/2017  CHIEF COMPLAINT: Follow-up for iron deficiency anemia and vitamin B12 deficiency.  HISTORY OF PRESENT ILLNESS:  She is a 72 year old lady who is known to have iron deficiency anemia and B12 deficiency for several years she was followed at this clinic since dating back to at least 2016.  She received intermittent IV iron infusions here.  And takes vitamin B12 injections at home once a month.    INTERVAL HISTORY: Today, she returns offering no new complaints.  She denies any excessive fatigue she has a history of COPD and diabetes and hypertension and hyperlipidemia.  She follows with her primary care on a regular basis.  She has not noted any change in her bowel habits no melena hematochezia.  No abdominal pain no unexplained weight loss.  All other systems review is negative.  CBC shows hemoglobin 11 which is stable.  Hematocrit 35.9 platelets 256 WBC count is normal at 7. CMP shows mild hypokalemia potassium of 5.2 which is been chronic.  Serum creatinine is 1.64 which has been higher in the past at 1.96 in December 2018.  ALT AST bilirubin are unremarkable.  Last iron panel from August 28, 2017 showed a ferritin of 97 saturation 23% serum iron 67 vitamin B12 level from 124 was normal at 451.    Blood pressure (!) 146/64, pulse 96, temperature 98.3 F (36.8 C), temperature source Oral, resp. rate 18, height 4\' 8"  (1.422 m), weight 220 lb (99.8 kg), SpO2 98 %.  Exam shows her to be in no acute distress. No pallor, icterus.  HEENT is normal.  Abdomen is soft, obese, nontender.  Extremities are without edema. She is alert awake oriented x3 no gross motor deficits were noted. Port-A-Cath appears to be clean with no signs of infection.   ASSESSMENT AND PLAN:  -Long-standing history of iron deficiency anemia. History of GI bleeding in October 2017 resolved with no recurrent symptoms Last colonoscopy was January 29, 2016 tubular adenomas without dysplasia or malignancy.   Last IV iron infusion was June 30, 2017. Her hemoglobin today remains stable.  Her iron panel today is pending we will contact her with results and arrange for infusion if necessary.  She had previously taken oral iron and apparently did not respond.  -Vitamin B12 deficiency on B12 supplements at home, will confirm replenished [place] of vitamin B12 by checking an MMA level today. Port flush every 2 months per protocol.  -Chronic kidney disease also likely contributing to some anemia but as long as her hemoglobin is about 10 no indication for erythropoietin injections.  Her serum creatinine is actually improved compared to last visit.

## 2017-09-01 ENCOUNTER — Other Ambulatory Visit (HOSPITAL_COMMUNITY): Payer: Self-pay | Admitting: Nephrology

## 2017-09-01 DIAGNOSIS — N183 Chronic kidney disease, stage 3 unspecified: Secondary | ICD-10-CM

## 2017-09-04 ENCOUNTER — Telehealth (HOSPITAL_COMMUNITY): Payer: Self-pay

## 2017-09-04 NOTE — Telephone Encounter (Signed)
Patient called requesting results of labs from 08/28/17. Message sent to provider. Will call patient once result note received.

## 2017-09-04 NOTE — Telephone Encounter (Signed)
Dr. Sherrine Maples saw the patient that day and labs were sent to her for review.   Thanks! gwd

## 2017-09-04 NOTE — Telephone Encounter (Signed)
Notified patient with understanding verbalized.

## 2017-09-04 NOTE — Telephone Encounter (Signed)
Yes, I received a forwarded message from [place] and called patient with results.

## 2017-09-04 NOTE — Telephone Encounter (Signed)
-----   Message from Donetta Potts, RN sent at 09/04/2017 10:45 AM EST ----- Can you call this patient and let her know about her labs?  Thank you. ----- Message ----- From: Creola Corn, MD Sent: 09/03/2017   8:59 AM To: Donetta Potts, RN  Iron panel is normal, B12 normal, cbc is stable, kidney function is slightly better. She does not nee IV iron now, keep[ her follow up as scheduled. Continue B12 injections. ----- Message ----- From: Donetta Potts, RN Sent: 09/01/2017   4:48 PM To: Creola Corn, MD  Can you advise on her recent lab work?  She called today wanting results.  Thank you

## 2017-09-10 ENCOUNTER — Ambulatory Visit (HOSPITAL_COMMUNITY)
Admission: RE | Admit: 2017-09-10 | Discharge: 2017-09-10 | Disposition: A | Discharge status: Home / Self Care | Payer: Managed Care, Other (non HMO) | Source: Ambulatory Visit | Attending: Nephrology | Admitting: Nephrology

## 2017-09-10 DIAGNOSIS — N183 Chronic kidney disease, stage 3 unspecified: Secondary | ICD-10-CM

## 2017-09-18 ENCOUNTER — Ambulatory Visit (INDEPENDENT_AMBULATORY_CARE_PROVIDER_SITE_OTHER): Payer: Managed Care, Other (non HMO) | Admitting: Gastroenterology

## 2017-09-18 ENCOUNTER — Encounter: Payer: Self-pay | Admitting: Gastroenterology

## 2017-09-18 VITALS — BP 184/87 | HR 86 | Temp 97.0°F | Ht <= 58 in | Wt 229.2 lb

## 2017-09-18 DIAGNOSIS — K5901 Slow transit constipation: Secondary | ICD-10-CM | POA: Diagnosis not present

## 2017-09-18 DIAGNOSIS — R143 Flatulence: Secondary | ICD-10-CM

## 2017-09-18 DIAGNOSIS — K219 Gastro-esophageal reflux disease without esophagitis: Secondary | ICD-10-CM

## 2017-09-18 MED ORDER — CIPROFLOXACIN HCL 500 MG PO TABS: ORAL_TABLET | ORAL | 0 refills | Status: DC

## 2017-09-18 MED ORDER — METRONIDAZOLE 500 MG PO TABS: 500.0000 mg | ORAL_TABLET | Freq: Two times a day (BID) | ORAL | 0 refills | Status: DC

## 2017-09-18 NOTE — Assessment & Plan Note (Signed)
SYMPTOMS NOT IDEALLY CONTROLLED and may be due to SMALL INTESTINE BACTERIAL OVERGROWTH and food intolerance.   DRINK WATER TO KEEP YOUR URINE LIGHT YELLOW. To reduce bloating and gas:   1. AVOID ITEMS THAT CAUSE BLOATING & GAS. See info below.   2. ADD LACTASE 3 PILLS WITH MEALS THREE TIMES A DAY.    3. TAKE CIPRO AND FLAGYL TWICE DAILY FOR 5 DAYS.  MEDICATION SIDE EFFECTS INCLUDE HEEL PAIN, NAUSEA, VOMITING. PATIENT SHOULD AVOID ALCOHOL AND COUGH SYRUP WITH ALCOHOL.  CALL IN ONE MONTH IF SYMPTOMS ARE NOT IMPROVED.   FOLLOW UP IN 3 MOS.

## 2017-09-18 NOTE — Progress Notes (Signed)
CC'D TO PCP °

## 2017-09-18 NOTE — Assessment & Plan Note (Signed)
SYMPTOMS CONTROLLED/RESOLVED.  CONTINUE TO MONITOR SYMPTOMS. 

## 2017-09-18 NOTE — Assessment & Plan Note (Addendum)
SYMPTOMS FAIRLY WELL CONTROLLED.  CONTINUE NEXIUM. TAKE 30 MINUTES BEFORE MEALS. Pepcid if needed FOLLOW UP IN 3 MOS.

## 2017-09-18 NOTE — Progress Notes (Signed)
ON RECALL  °

## 2017-09-18 NOTE — Progress Notes (Signed)
Subjective:    Patient ID: [name], female    DOB: September 02, 1945, 72 y.o.   MRN: [id number]  Rita Everts, MD  HPI PASSING GAS ALL TH E TIME. NO REAL ODOR. TRIED EVERYTHING OTC. SOMETIMES PASS GAS AND IT SMELLS LIKE SHE HAD A BOWEL MOVEMENT. HARDLY EATS MEAT. DAIRY: VERY LITTLE. EATS OUT SOMETIMES. VERY SELDOM CANNED FOODS AND NO BOXED DINNERS.  BMs: 2-3 TIMES A DAY(#6)(RARE #7). SNACKS: FRUIT, RARE CHIPS OR CHEETOS. SOB MOST OF THE TIME. NEW MEDS(NEURONTIN). CHANGE IN GAS/BOWEL 5-6 WEEKS(BMs MORE LIKE #4). MILD ABDOMINAL PAIN OFF AND ON. STOPPED GLUCOPHAGE 2-3 WEEK [place]AGO.   PT DENIES FEVER, CHILLS, HEMATOCHEZIA, HEMATEMESIS, nausea, vomiting, melena, CHEST PAIN, SHORTNESS OF BREATH,  constipation, problems swallowing, OR heartburn or indigestion.  Past Medical History:  Diagnosis Date  . Blood transfusion without reported diagnosis   . CAD (coronary artery disease)    Nonobstructive at cardiac catheterization 2000  . Cataract   . CHF (congestive heart failure) ([place])   . Chronic back pain   . Chronic kidney disease   . COPD (chronic obstructive pulmonary disease) ([place])   . Degenerative disc disease   . Essential hypertension, benign   . GERD (gastroesophageal reflux disease)   . Gout   . Hiatal hernia 07/27/2013  . History of diverticulitis of colon   . History of hiatal hernia   . Iron deficiency anemia   . Irritable bowel syndrome   . Lumbar radiculopathy   . Mixed hyperlipidemia   . Neuropathy   . Osteoporosis   . Ovarian cancer ([place]) 04/02/2016  . Oxygen deficiency   . Sleep apnea   . Type 2 diabetes mellitus ([place])   . Vitamin B deficiency 12/25/2009  . Vitamin B12 deficiency     Past Surgical History:  Procedure Laterality Date  . ABDOMINAL HYSTERECTOMY    . BACK SURGERY    . Benign breast cysts    . CHOLECYSTECTOMY    . COLONOSCOPY  10/01/2006   SLF:Pan colonic diverticulosis and moderate internal hemorrhoids/ Otherwise no polyps, masses,  inflammatory changes or AVMs/  . COLONOSCOPY  2011   SLF: pancolonic diverticulosis, large internal hemorrhoids  . COLONOSCOPY N/A 01/26/2016   Procedure: COLONOSCOPY;  Surgeon: [name], MD;  Location[facility];  Service: Endoscopy;  Laterality: N/A;  830   . ESOPHAGOGASTRODUODENOSCOPY  11/19/2006   SLF: Large hiatal hernia without evidence of Cameron ulcers/. Distal esophageal stricture, which allowed the gastroscope to pass without resistance.  A 16 mm Savary later passed with mild resistance/ Normal stomach.sb bx negative  . ESOPHAGOGASTRODUODENOSCOPY  10/01/2006   QZE:[id number] hiatal hernia.  Distal esophagus without evidence of   erythema, ulceration or Barrett's esophagus  . ESOPHAGOGASTRODUODENOSCOPY  2011   SLF: large hh, distal esophageal web narrowing to 22mm s/p dilation to 73mm  . ESOPHAGOGASTRODUODENOSCOPY N/A 08/06/2013   SLF: 1. Stricture at the gastroesophageal junction 2. large hiatal hernia. 3. Mild erosive gastritis.  Marland Kitchen GIVENS CAPSULE STUDY N/A 08/06/2013   INCOMPLETE-SMALL BOWLE ULCERS  . KNEE SURGERY Right   . PARTIAL HYSTERECTOMY  1978  . small bowel capsule  2008   negative  . SPINE SURGERY    . TONSILLECTOMY AND ADENOIDECTOMY    . Two back surgeries/fusion    . UMBILICAL HERNIA REPAIR  2010    No Known Allergies  Current Outpatient Medications  Medication Sig Dispense Refill  . albuterol nebulizer solution Take 3 mLs QPH PRN SOB    .  allopurinol 100 MG tablet Take 1 tablet by mouth 2 (two) times daily.    Marland Kitchen amitriptyline 100 MG tablet Take 100 mg by mouth at bedtime.      Marland Kitchen aspirin EC 81 MG tablet Take 81 mg by mouth daily.    Marland Kitchen atorvastatin  40 MG tablet Take 1 tablet by mouth at bedtime.     .      . BIOTIN PO Take 1 capsule by mouth daily.     . ,VITAMIN B-12,) 1000 MCG/ML  INJECT 1 ML INTRAMUSCULARLY EVERY 30 DAYS    . esomeprazole 40 MG TAKE 1 CAPSULE BID QAC    . famotidine  40 MG tablet Take 40 mg QHS PRN    . fluticasone furoate-vilanterol   Inhale 1 puff into the lungs daily.    . furosemide  80 MG tablet 80/60 qod    . gabapentin  300 MG capsule Take 1 capsule by mouth 2 (two) times daily.    . insulin lispro  Use as directed [place] by sliding scale    . Insulin Pen Needle  USE TO ADMINISTER INSULIN 4 TIMES DAILY    . LANTUS Inject 20 Units into the skin at bedtime.     Marland Kitchen loperamide2 MG capsule Take 2 mg prn loose stools. RARE   . meclizine  25 MG tablet Take 1 tablet (25 mg total) by mouth 3 (three) times daily as needed for dizziness.    . metoprolol succinate 50 MG 24 hr tablet Take 50 mg by mouth daily. Take with or immediately following a meal.    .      . Prednisolone-Gatifloxacin 1-0.5  Apply to eye.    . telmisartan (MICARDIS) 80 MG  Take 1 tablet (80 mg total) by mouth daily.          .       Review of Systems PER HPI OTHERWISE ALL SYSTEMS ARE NEGATIVE.    Objective:   Physical Exam  Constitutional: She is oriented to person, place, and time. She appears well-developed and well-nourished. No distress.  HENT:  Head: Normocephalic and atraumatic.  Mouth/Throat: Oropharynx is clear and moist. No oropharyngeal exudate.  Eyes: Pupils are equal, round, and reactive to light. No scleral icterus.  Neck: Normal range of motion. Neck supple.  Cardiovascular: Normal rate, regular rhythm and normal heart sounds.  Pulmonary/Chest: Effort normal and breath sounds normal. No respiratory distress.  Abdominal: Soft. Bowel sounds are normal. She exhibits no distension. There is tenderness. There is no rebound and no guarding.  MILD tenderness in LLQ   Musculoskeletal: She exhibits edema and deformity.  Lymphadenopathy:    She has no cervical adenopathy.  Neurological: She is alert and oriented to person, place, and time.  NO  NEW FOCAL DEFICITS  Psychiatric:  FLAT AFFECT, NL MOOD  Vitals reviewed.     Assessment & Plan:

## 2017-09-18 NOTE — Patient Instructions (Signed)
DRINK WATER TO KEEP YOUR URINE LIGHT YELLOW.   To reduce bloating and gas:   1. AVOID ITEMS THAT CAUSE BLOATING & GAS. See info below.   2. ADD LACTASE 3 PILLS WITH MEALS THREE TIMES A DAY.    3. TAKE CIPRO AND FLAGYL TWICE DAILY FOR 5 DAYS.  MEDICATION SIDE EFFECTS INCLUDE HEEL PAIN, NAUSEA, VOMITING. PATIENT SHOULD AVOID ALCOHOL AND COUGH SYRUP WITH ALCOHOL.    Please CALL IN ONE MONTH IF SYMPTOMS ARE NOT IMPROVED.   FOLLOW UP IN 3 MOS.    BLOATING AND GAS PREVENTION  Although gas may be uncomfortable and embarrassing, it is not life-threatening. Understanding causes, ways to reduce symptoms, and treatment will help most people find some relief.  Points to remember . Everyone has gas in the digestive tract. Marland Kitchen People often believe normal passage of gas to be excessive. . Gas comes from two main sources: swallowed air and normal breakdown of certain foods by harmless bacteria naturally present in the large intestine. . Many foods with carbohydrates can cause gas. Fats and proteins cause little gas. . Foods that may cause gas include o beans  o vegetables, such as broccoli, cabbage, brussels sprouts, onions, artichokes, and asparagus  o fruits, such as pears, apples, and peaches  o whole grains, such as whole wheat and bran  o soft drinks and fruit drinks  o milk and milk products, such as cheese and ice cream, and packaged foods prepared with lactose, such as bread, cereal, and salad dressing  o foods containing sorbitol, such as dietetic foods and sugar free candies and gums . The most common symptoms of gas are belching, flatulence, bloating, and abdominal pain. However, some of these symptoms are often caused by an intestinal disorder, such as irritable bowel syndrome, rather than too much gas. . The most common ways to reduce the discomfort of gas are changing diet, taking nonprescription medicines, and reducing the amount of air swallowed. . Digestive enzymes, such as lactase  supplements, actually help digest carbohydrates and may allow people to eat foods that normally cause gas.

## 2017-10-01 ENCOUNTER — Emergency Department (HOSPITAL_COMMUNITY): Payer: Managed Care, Other (non HMO)

## 2017-10-01 ENCOUNTER — Other Ambulatory Visit: Payer: Self-pay

## 2017-10-01 ENCOUNTER — Emergency Department (HOSPITAL_COMMUNITY)
Admission: EM | Admit: 2017-10-01 | Discharge: 2017-10-01 | Disposition: A | Discharge status: Home / Self Care | Payer: Managed Care, Other (non HMO) | Attending: Emergency Medicine | Admitting: Emergency Medicine

## 2017-10-01 ENCOUNTER — Encounter (HOSPITAL_COMMUNITY): Payer: Self-pay | Admitting: Emergency Medicine

## 2017-10-01 DIAGNOSIS — M1712 Unilateral primary osteoarthritis, left knee: Secondary | ICD-10-CM | POA: Diagnosis not present

## 2017-10-01 DIAGNOSIS — E1140 Type 2 diabetes mellitus with diabetic neuropathy, unspecified: Secondary | ICD-10-CM | POA: Insufficient documentation

## 2017-10-01 DIAGNOSIS — Z794 Long term (current) use of insulin: Secondary | ICD-10-CM | POA: Diagnosis not present

## 2017-10-01 DIAGNOSIS — I2510 Atherosclerotic heart disease of native coronary artery without angina pectoris: Secondary | ICD-10-CM | POA: Insufficient documentation

## 2017-10-01 DIAGNOSIS — Z79899 Other long term (current) drug therapy: Secondary | ICD-10-CM | POA: Insufficient documentation

## 2017-10-01 DIAGNOSIS — N183 Chronic kidney disease, stage 3 (moderate): Secondary | ICD-10-CM | POA: Diagnosis not present

## 2017-10-01 DIAGNOSIS — I130 Hypertensive heart and chronic kidney disease with heart failure and stage 1 through stage 4 chronic kidney disease, or unspecified chronic kidney disease: Secondary | ICD-10-CM | POA: Insufficient documentation

## 2017-10-01 DIAGNOSIS — Z7982 Long term (current) use of aspirin: Secondary | ICD-10-CM | POA: Diagnosis not present

## 2017-10-01 DIAGNOSIS — I5033 Acute on chronic diastolic (congestive) heart failure: Secondary | ICD-10-CM | POA: Diagnosis not present

## 2017-10-01 DIAGNOSIS — J449 Chronic obstructive pulmonary disease, unspecified: Secondary | ICD-10-CM | POA: Diagnosis not present

## 2017-10-01 DIAGNOSIS — M25562 Pain in left knee: Secondary | ICD-10-CM | POA: Diagnosis present

## 2017-10-01 DIAGNOSIS — Z87891 Personal history of nicotine dependence: Secondary | ICD-10-CM | POA: Insufficient documentation

## 2017-10-01 MED ORDER — TRAMADOL HCL 50 MG PO TABS
50.0000 mg | ORAL_TABLET | Freq: Once | ORAL | Status: AC
  Administered 2017-10-01: 50 mg via ORAL
  Filled 2017-10-01: qty 1

## 2017-10-01 MED ORDER — HYDROCODONE-ACETAMINOPHEN 5-325 MG PO TABS: ORAL_TABLET | ORAL | 0 refills | Status: DC

## 2017-10-01 NOTE — ED Triage Notes (Signed)
Left knee pain for several weeks. Denies fall or injury.

## 2017-10-01 NOTE — ED Provider Notes (Signed)
[facility] EMERGENCY DEPARTMENT Provider Note   CSN: [id number] Arrival date & time: 10/01/17  8185     History   Chief Complaint Chief Complaint  Patient presents with  . Knee Pain    HPI [name] is a 72 y.o. female.  HPI   [name] is a 72 y.o. female who presents to the Emergency Department complaining of left knee pain for 1 month.  She states the pain has progressively worsened.  She complains of pain with movement of her knee, especially weightbearing.  She states that at times it feels like her knee is going to "give away" she denies known injury or fall.  She has not been taking any over-the-counter medications or home therapies for symptomatic relief.  She states that her knee feels swollen.  She denies fever, chills, redness, numbness or weakness of the extremity.  She does admit to a history of gout, but the pain in her knee does not feel similar to her gout flares.  Past Medical History:  Diagnosis Date  . Blood transfusion without reported diagnosis   . CAD (coronary artery disease)    Nonobstructive at cardiac catheterization 2000  . Cataract   . CHF (congestive heart failure) ([place])   . Chronic back pain   . Chronic kidney disease   . COPD (chronic obstructive pulmonary disease) ([place])   . Degenerative disc disease   . Essential hypertension, benign   . GERD (gastroesophageal reflux disease)   . Gout   . Hiatal hernia 07/27/2013  . History of diverticulitis of colon   . History of hiatal hernia   . Iron deficiency anemia   . Irritable bowel syndrome   . Lumbar radiculopathy   . Mixed hyperlipidemia   . Neuropathy   . Osteoporosis   . Ovarian cancer ([place]) 04/02/2016  . Oxygen deficiency   . Sleep apnea   . Type 2 diabetes mellitus ([place])   . Vitamin B deficiency 12/25/2009  . Vitamin B12 deficiency     Patient Active Problem List   Diagnosis Date Noted  . Flatulence 09/18/2017  . IBS (irritable bowel syndrome) 04/03/2017  .  Gout 03/15/2017  . Acute on chronic diastolic CHF (congestive heart failure) ([place]) 05/08/2016  . CKD (chronic kidney disease) stage 3, GFR 30-59 ml/min (HCC) 05/08/2016  . Constipation due to slow transit 04/25/2016  . Peripheral vertigo 06/27/2015  . Port-A-Cath in place 10/06/2014  . Chronic obstructive pulmonary disease ([place]) 04/30/2014  . Morbid (severe) obesity due to excess calories ([place]) 04/30/2014  . Hemorrhoids, internal, with bleeding 11/10/2013  . Anemia 08/18/2013  . Acid reflux   . Obstructive chronic bronchitis without exacerbation ([place]) 12/26/2009  . DM type 2 with diabetic peripheral neuropathy ([place]) 12/25/2009  . Vitamin B deficiency 12/25/2009  . Iron deficiency anemia due to chronic blood loss 12/25/2009  . Essential hypertension, benign 12/25/2009  . DEGENERATIVE DISC DISEASE 12/25/2009  . OSA on CPAP 12/25/2009  . HLD (hyperlipidemia) 12/25/2009  . Controlled type 2 diabetes mellitus with diabetic polyneuropathy, with long-term current use of insulin ([place]) 12/25/2009    Past Surgical History:  Procedure Laterality Date  . ABDOMINAL HYSTERECTOMY    . BACK SURGERY    . Benign breast cysts    . CHOLECYSTECTOMY    . COLONOSCOPY  10/01/2006   SLF:Pan colonic diverticulosis and moderate internal hemorrhoids/ Otherwise no polyps, masses, inflammatory changes or AVMs/  . COLONOSCOPY  2011   SLF: pancolonic diverticulosis, large internal hemorrhoids  .  COLONOSCOPY N/A 01/26/2016   Procedure: COLONOSCOPY;  Surgeon: [name], MD;  Location[facility];  Service: Endoscopy;  Laterality: N/A;  830   . ESOPHAGOGASTRODUODENOSCOPY  11/19/2006   SLF: Large hiatal hernia without evidence of Cameron ulcers/. Distal esophageal stricture, which allowed the gastroscope to pass without resistance.  A 16 mm Savary later passed with mild resistance/ Normal stomach.sb bx negative  . ESOPHAGOGASTRODUODENOSCOPY  10/01/2006   [id number] hiatal hernia.  Distal esophagus without  evidence of   erythema, ulceration or Barrett's esophagus  . ESOPHAGOGASTRODUODENOSCOPY  2011   SLF: large hh, distal esophageal web narrowing to 26mm s/p dilation to 91mm  . ESOPHAGOGASTRODUODENOSCOPY N/A 08/06/2013   SLF: 1. Stricture at the gastroesophageal junction 2. large hiatal hernia. 3. Mild erosive gastritis.  Marland Kitchen GIVENS CAPSULE STUDY N/A 08/06/2013   INCOMPLETE-SMALL BOWLE ULCERS  . KNEE SURGERY Right   . PARTIAL HYSTERECTOMY  1978  . small bowel capsule  2008   negative  . SPINE SURGERY    . TONSILLECTOMY AND ADENOIDECTOMY    . Two back surgeries/fusion    . UMBILICAL HERNIA REPAIR  2010    OB History    No data available       Home Medications    Prior to Admission medications   Medication Sig Start Date End Date Taking? Authorizing Provider  albuterol (PROVENTIL) (2.5 MG/3ML) 0.083% nebulizer solution Take 3 mLs (2.5 mg total) by nebulization every 6 (six) hours as needed for wheezing or shortness of breath. 05/10/16   Kathie Dike, MD  allopurinol (ZYLOPRIM) 100 MG tablet Take 1 tablet by mouth 2 (two) times daily. 10/09/12   [provider]  amitriptyline (ELAVIL) 100 MG tablet Take 100 mg by mouth at bedtime.      [provider]  aspirin EC 81 MG tablet Take 81 mg by mouth daily.    [provider]  atorvastatin (LIPITOR) 40 MG tablet Take 1 tablet by mouth at bedtime.  10/09/12   [provider]  B-D ULTRA-FINE 33 LANCETS MISC USE TO CHECK BG 3 TIMES A DAY 01/19/16   [provider]  BIOTIN PO Take 1 capsule by mouth daily.     [provider]  ciprofloxacin (CIPRO) 500 MG tablet 1 PO BID FOR 5 DAYS 09/18/17   Fields, Sandi L, MD  cyanocobalamin (,VITAMIN B-12,) 1000 MCG/ML injection INJECT 1 ML INTRAMUSCULARLY EVERY 30 DAYS 06/11/17   Twana First, MD  esomeprazole (NEXIUM) 40 MG capsule TAKE 1 CAPSULE TWICE A DAY 30 MINUTES PRIOR TO MEALS 07/23/17   Mahala Menghini, PA-C  famotidine (PEPCID) 40 MG tablet TAKE 1  TABLET AT BEDTIME AS NEEDED TO CONTROL REFLUX Patient taking differently: Take 40 mg by mouth at bedtime as needed for heartburn or indigestion.  06/23/15   Mannam, Hart Robinsons, MD  fluticasone furoate-vilanterol (BREO ELLIPTA) 100-25 MCG/INH AEPB Inhale 1 puff into the lungs daily.    [provider]  furosemide (LASIX) 80 MG tablet Take 1 tablet (80 mg total) by mouth daily. Take 80 mg alternating with 60 mg every other day 07/16/17 10/14/17  Satira Sark, MD  gabapentin (NEURONTIN) 300 MG capsule Take 1 capsule by mouth 2 (two) times daily. 08/28/17   [provider]  insulin lispro (HUMALOG) 100 UNIT/ML KiwkPen Use as directed [place] by sliding scale with meals. MDD=50 units 03/12/16   [provider]  Insulin Pen Needle (FIFTY50 PEN NEEDLES) 32G X 4 MM MISC USE TO ADMINISTER INSULIN 4  TIMES DAILY 08/27/17   [provider]  LANTUS SOLOSTAR 100 UNIT/ML SOPN Inject 20 Units into the skin at bedtime.  10/06/12   [provider]  loperamide (IMODIUM) 2 MG capsule Take 2 mg by mouth as needed for diarrhea or loose stools.    [provider]  meclizine (ANTIVERT) 25 MG tablet Take 1 tablet (25 mg total) by mouth 3 (three) times daily as needed for dizziness. 07/21/17   [name], MD  metFORMIN (GLUCOPHAGE) 500 MG tablet Take 1,000 mg by mouth 2 (two) times daily.    [provider]  metoprolol succinate (TOPROL-XL) 50 MG 24 hr tablet Take 50 mg by mouth daily. Take with or immediately following a meal.    [provider]  metroNIDAZOLE (FLAGYL) 500 MG tablet Take 1 tablet (500 mg total) by mouth 2 (two) times daily. FOR 5 DAYS 09/18/17   [name], MD  Misc. Devices MISC 3cc , 25G x 1" [employer] Syringe for Vitamin B12 injections monthly 07/05/16   Baird Cancer, PA-C  Prednisolone-Gatifloxacin 1-0.5 % SOLN Apply to eye.    [provider]  telmisartan (MICARDIS) 80 MG tablet Take 1 tablet (80 mg total) by mouth  daily. 07/17/17 07/17/18  [name], MD  tiotropium (SPIRIVA) 18 MCG inhalation capsule Place 18 mcg into inhaler and inhale daily.    [provider]    Family History Family History  Problem Relation Age of Onset  . Colon cancer Brother        diagnosed age 1. Living.   Marland Kitchen Ulcers Sister   . Diabetes Sister   . Heart attack Sister   . Kidney failure Sister   . Stroke Sister   . Ulcers Mother   . Diabetes Mother   . Heart attack Mother   . Stroke Mother   . Asthma Mother   . Heart disease Mother   . Heart attack Brother   . Heart disease Brother   . Asthma Sister   . Diabetes Brother   . Stroke Maternal Grandmother   . Heart attack Maternal Grandmother   . Heart attack Other   . Early death Father        MVA in his 77s    Social History Social History   Tobacco Use  . Smoking status: Former Smoker    Packs/day: 1.00    Years: 1.00    Pack years: 1.00    Types: Cigarettes    Start date: 02/19/1961    Last attempt to quit: 08/05/1961    Years since quitting: 56.1  . Smokeless tobacco: Never Used  . Tobacco comment: Quit smoking x 50 years  Substance Use Topics  . Alcohol use: No  . Drug use: No     Allergies   Patient has no known allergies.   Review of Systems Review of Systems  Constitutional: Negative for chills and fever.  Respiratory: Negative for shortness of breath.   Cardiovascular: Negative for chest pain.  Musculoskeletal: Positive for arthralgias (Left knee pain and swelling) and joint swelling.  Skin: Negative for color change and wound.  Neurological: Negative for weakness and numbness.  All other systems reviewed and are negative.    Physical Exam Updated Vital Signs BP (!) 172/70   Pulse 85   Temp 98 F (36.7 C) (Oral)   Resp 20   Ht 4\' 8"  (1.422 m)   Wt 103.9 kg (229 lb)   SpO2 96%   BMI 51.34  kg/m   Physical Exam  Constitutional: She is oriented to person, place, and time. She appears well-developed and  well-nourished. No distress.  Cardiovascular: Normal rate, regular rhythm and intact distal pulses.  Pulmonary/Chest: Effort normal and breath sounds normal.  Musculoskeletal: She exhibits tenderness. She exhibits no deformity.  Diffuse tenderness to palpation of the anterior left knee.  No obvious ligamentous instability.  No erythema, excessive warmth or effusion.  Neurological: She is alert and oriented to person, place, and time. She exhibits normal muscle tone. Coordination normal.  Skin: Skin is warm and dry. Capillary refill takes less than 2 seconds. No erythema.  Psychiatric: She has a normal mood and affect.  Nursing note and vitals reviewed.    ED Treatments / Results  Labs (all labs ordered are listed, but only abnormal results are displayed) Labs Reviewed - No data to display  EKG  EKG Interpretation None       Radiology Dg Knee Complete 4 Views Left  Result Date: 10/01/2017 CLINICAL DATA:  Left knee pain for 8 weeks. EXAM: LEFT KNEE - COMPLETE 4+ VIEW COMPARISON:  None available. FINDINGS: Tricompartmental degenerative changes most significant in the medial compartment with joint space narrowing and spurring. No acute bony abnormality or osteochondral lesion. No definite chondrocalcinosis. Extensive vascular calcifications are noted. No definite joint effusion. IMPRESSION: Tricompartmental degenerative changes most significant in the medial compartment. No acute bony findings or joint effusion. Electronically Signed   By: Marijo Sanes M.D.   On: 10/01/2017 10:14    Procedures Procedures (including critical care time)  Knee sleeve applied by nursing staff for support.  Medications Ordered in ED Medications  traMADol (ULTRAM) tablet 50 mg (not administered)     Initial Impression / Assessment and Plan / ED Course  I have reviewed the triage vital signs and the nursing notes.  Pertinent labs & imaging results that were available during my care of the patient  were reviewed by me and considered in my medical decision making (see chart for details).     Patient with likely acute on chronic left knee pain.  No concerning symptoms for septic joint. NV intact. X-ray results reviewed by me, and showed degenerative changes throughout.  Review of patient's medical records show elevated BUN and creatinine from January, so I will refrain from giving nonsteroidals.  Patient advised to elevate her legs when possible knee sleeve for intermittent support and referral information given for local orthopedics.  Patient agrees to treatment plan and all questions were answered.  Final Clinical Impressions(s) / ED Diagnoses   Final diagnoses:  Osteoarthritis of left knee, unspecified osteoarthritis type    ED Discharge Orders    None       Kem Parkinson, PA-C 10/01/17 1058    Milton Ferguson, MD 10/02/17 [phone]

## 2017-10-01 NOTE — Discharge Instructions (Signed)
Elevate your leg when possible and apply ice packs on and off.  Wear the knee brace as needed for support but do not wear continuously and remove at bedtime.  Call Dr. Ruthe Mannan office to arrange a follow-up appointment

## 2017-10-11 LAB — HEMOGLOBIN A1C
HEMOGLOBIN A1C: 6.9 %{Hb} — AB (ref <5.7)
Mean Plasma Glucose: 151 (calc)
eAG (mmol/L): 8.4 (calc)

## 2017-10-11 LAB — COMPLETE METABOLIC PANEL WITH GFR
AG RATIO: 1.4 (calc) (ref 1.0–2.5)
ALT: 7 U/L (ref 6–29)
AST: 10 U/L (ref 10–35)
Albumin: 3.8 g/dL (ref 3.6–5.1)
Alkaline phosphatase (APISO): 101 U/L (ref 33–130)
BUN / CREAT RATIO: 17 (calc) (ref 6–22)
BUN: 28 mg/dL — ABNORMAL HIGH (ref 7–25)
CALCIUM: 8.4 mg/dL — AB (ref 8.6–10.4)
CO2: 27 mmol/L (ref 20–32)
Chloride: 105 mmol/L (ref 98–110)
Creat: 1.68 mg/dL — ABNORMAL HIGH (ref 0.60–0.93)
GFR, EST NON AFRICAN AMERICAN: 30 mL/min/{1.73_m2} — AB (ref >=60)
GFR, Est African American: 35 mL/min/{1.73_m2} — ABNORMAL LOW (ref >=60)
GLOBULIN: 2.7 g/dL (ref 1.9–3.7)
Glucose, Bld: 93 mg/dL (ref 65–99)
POTASSIUM: 5.4 mmol/L — AB (ref 3.5–5.3)
SODIUM: 140 mmol/L (ref 135–146)
Total Bilirubin: 0.6 mg/dL (ref 0.2–1.2)
Total Protein: 6.5 g/dL (ref 6.1–8.1)

## 2017-10-11 LAB — HEPATITIS C ANTIBODY
Hepatitis C Ab: NONREACTIVE
SIGNAL TO CUT-OFF: 0.01 (ref <1.00)

## 2017-10-11 LAB — LIPID PANEL
Cholesterol: 257 mg/dL — ABNORMAL HIGH (ref <200)
HDL: 38 mg/dL — AB (ref >50)
LDL Cholesterol (Calc): 186 mg/dL (calc) — ABNORMAL HIGH
NON-HDL CHOLESTEROL (CALC): 219 mg/dL — AB (ref <130)
Total CHOL/HDL Ratio: 6.8 (calc) — ABNORMAL HIGH (ref <5.0)
Triglycerides: 177 mg/dL — ABNORMAL HIGH (ref <150)

## 2017-10-11 LAB — VITAMIN D 25 HYDROXY (VIT D DEFICIENCY, FRACTURES): Vit D, 25-Hydroxy: 7 ng/mL — ABNORMAL LOW (ref 30–100)

## 2017-10-13 ENCOUNTER — Other Ambulatory Visit: Payer: Self-pay | Admitting: Family Medicine

## 2017-10-13 MED ORDER — VITAMIN D (ERGOCALCIFEROL) 1.25 MG (50000 UNIT) PO CAPS: 50000.0000 [IU] | ORAL_CAPSULE | ORAL | 0 refills | Status: DC

## 2017-10-14 ENCOUNTER — Ambulatory Visit (INDEPENDENT_AMBULATORY_CARE_PROVIDER_SITE_OTHER): Payer: Managed Care, Other (non HMO) | Admitting: Orthopedic Surgery

## 2017-10-14 VITALS — BP 148/98 | HR 84 | Ht <= 58 in | Wt 229.0 lb

## 2017-10-14 DIAGNOSIS — M25562 Pain in left knee: Secondary | ICD-10-CM

## 2017-10-14 DIAGNOSIS — M1712 Unilateral primary osteoarthritis, left knee: Secondary | ICD-10-CM | POA: Diagnosis not present

## 2017-10-14 DIAGNOSIS — G8929 Other chronic pain: Secondary | ICD-10-CM | POA: Diagnosis not present

## 2017-10-14 MED ORDER — TRAMADOL-ACETAMINOPHEN 37.5-325 MG PO TABS: 1.0000 | ORAL_TABLET | Freq: Four times a day (QID) | ORAL | 0 refills | Status: DC | PRN

## 2017-10-14 NOTE — Patient Instructions (Signed)
Continue use of the walker  Ice the knee 3 times a day  Take medication every 6 hours as needed for pain  If no improvement after 2 weeks please call to schedule another appointment   Diagnosis osteoarthritis left knee

## 2017-10-14 NOTE — Progress Notes (Signed)
NEW PATIENT OFFICE VISIT   Chief Complaint  Patient presents with  . Knee Pain    Left knee pain, no injury. Xrays at [facility].    72 year old female presents for evaluation of acute on chronic left knee pain  The patient reports no trauma but complains of 8-week history in 2-week history of increasing left knee pain severe associated with swelling and loss of motion as well as increased difficulty with walking requiring a walker, no treatment other than ER evaluation which showed that she has 3 compartment osteoarthritis of the left knee  She has a history of hypertension diabetes COPD on oxygen status post 2 lumbar surgeries coronary artery disease congestive heart failure kidney disease and chronic venous stasis disease    Review of Systems  Constitutional: Negative for chills and fever.  Respiratory: Positive for shortness of breath.   Cardiovascular: Negative for chest pain.  Musculoskeletal: Positive for back pain.  Skin: Positive for rash.  Neurological: Negative for sensory change.     Past Medical History:  Diagnosis Date  . Blood transfusion without reported diagnosis   . CAD (coronary artery disease)    Nonobstructive at cardiac catheterization 2000  . Cataract   . CHF (congestive heart failure) ([place])   . Chronic back pain   . Chronic kidney disease   . COPD (chronic obstructive pulmonary disease) ([place])   . Degenerative disc disease   . Essential hypertension, benign   . GERD (gastroesophageal reflux disease)   . Gout   . Hiatal hernia 07/27/2013  . History of diverticulitis of colon   . History of hiatal hernia   . Iron deficiency anemia   . Irritable bowel syndrome   . Lumbar radiculopathy   . Mixed hyperlipidemia   . Neuropathy   . Osteoporosis   . Ovarian cancer ([place]) 04/02/2016  . Oxygen deficiency   . Sleep apnea   . Type 2 diabetes mellitus ([place])   . Vitamin B deficiency 12/25/2009  . Vitamin B12 deficiency     Past Surgical History:    Procedure Laterality Date  . ABDOMINAL HYSTERECTOMY    . BACK SURGERY    . Benign breast cysts    . CHOLECYSTECTOMY    . COLONOSCOPY  10/01/2006   SLF:Pan colonic diverticulosis and moderate internal hemorrhoids/ Otherwise no polyps, masses, inflammatory changes or AVMs/  . COLONOSCOPY  2011   SLF: pancolonic diverticulosis, large internal hemorrhoids  . COLONOSCOPY N/A 01/26/2016   Procedure: COLONOSCOPY;  Surgeon: [name], MD;  [facility];  Service: Endoscopy;  Laterality: N/A;  830   . ESOPHAGOGASTRODUODENOSCOPY  11/19/2006   SLF: Large hiatal hernia without evidence of Cameron ulcers/. Distal esophageal stricture, which allowed the gastroscope to pass without resistance.  A 16 mm Savary later passed with mild resistance/ Normal stomach.sb bx negative  . ESOPHAGOGASTRODUODENOSCOPY  10/01/2006   NID:POEUM hiatal hernia.  Distal esophagus without evidence of   erythema, ulceration or Barrett's esophagus  . ESOPHAGOGASTRODUODENOSCOPY  2011   SLF: large hh, distal esophageal web narrowing to 44mm s/p dilation to 61mm  . ESOPHAGOGASTRODUODENOSCOPY N/A 08/06/2013   SLF: 1. Stricture at the gastroesophageal junction 2. large hiatal hernia. 3. Mild erosive gastritis.  Marland Kitchen GIVENS CAPSULE STUDY N/A 08/06/2013   INCOMPLETE-SMALL BOWLE ULCERS  . KNEE SURGERY Right   . PARTIAL HYSTERECTOMY  1978  . small bowel capsule  2008   negative  . SPINE SURGERY    . TONSILLECTOMY AND ADENOIDECTOMY    .  Two back surgeries/fusion    . UMBILICAL HERNIA REPAIR  2010    Family History  Problem Relation Age of Onset  . Colon cancer Brother        diagnosed age 61. Living.   Marland Kitchen Ulcers Sister   . Diabetes Sister   . Heart attack Sister   . Kidney failure Sister   . Stroke Sister   . Ulcers Mother   . Diabetes Mother   . Heart attack Mother   . Stroke Mother   . Asthma Mother   . Heart disease Mother   . Heart attack Brother   . Heart disease Brother   . Asthma Sister   . Diabetes  Brother   . Stroke Maternal Grandmother   . Heart attack Maternal Grandmother   . Heart attack Other   . Early death Father        MVA in his 26s   Social History   Tobacco Use  . Smoking status: Former Smoker    Packs/day: 1.00    Years: 1.00    Pack years: 1.00    Types: Cigarettes    Start date: 02/19/1961    Last attempt to quit: 08/05/1961    Years since quitting: 56.2  . Smokeless tobacco: Never Used  . Tobacco comment: Quit smoking x 50 years  Substance Use Topics  . Alcohol use: No  . Drug use: No    No outpatient medications have been marked as taking for the 10/14/17 encounter (Appointment) with Carole Civil, MD.    BP (!) 148/98   Pulse 84   Ht 4\' 8"  (1.422 m)   Wt 229 lb (103.9 kg)   BMI 51.34 kg/m   Physical Exam  Constitutional: She is oriented to person, place, and time. She appears well-developed and well-nourished.  Musculoskeletal:       Left knee: She exhibits no effusion.  Neurological: She is alert and oriented to person, place, and time. Gait abnormal.  Gait requires a walker  Skin:     Psychiatric: She has a normal mood and affect. Judgment normal.  Vitals reviewed.   Right Knee Exam   Muscle Strength  The patient has normal right knee strength.  Tests  Drawer:  Anterior - negative    Posterior - negative  Other  Sensation: normal Pulse: present   Left Knee Exam   Muscle Strength  The patient has normal left knee strength.  Tenderness  The patient is experiencing tenderness in the lateral joint line and medial joint line.  Range of Motion  Extension: 5  Flexion: 110   Tests  Varus: negative Valgus: negative Drawer:  Anterior - negative     Posterior - negative  Other  Sensation: normal Pulse: present Swelling: severe Effusion: no effusion present      MEDICAL DECISION SECTION  xrays ordered? no  My independent reading of xrays: From the hospital x-rays show 3 compartment arthritis large vessel  calcification   Encounter Diagnoses  Name Primary?  . Chronic pain of left knee Yes  . Primary osteoarthritis of left knee      PLAN:   Meds ordered this encounter  Medications  . traMADol-acetaminophen (ULTRACET) 37.5-325 MG tablet    Sig: Take 1 tablet by mouth every 6 (six) hours as needed.    Dispense:  20 tablet    Refill:  0   Injection? Yes Procedure note left knee injection verbal consent was obtained to inject left knee joint  Timeout was  completed to confirm the site of injection  The medications used were 40 mg of Depo-Medrol and 1% lidocaine 3 cc  Anesthesia was provided by ethyl chloride and the skin was prepped with alcohol.  After cleaning the skin with alcohol a 20-gauge needle was used to inject the left knee joint. There were no complications. A sterile bandage was applied.  Follow up as needed poor surgical candidate

## 2017-10-16 ENCOUNTER — Ambulatory Visit: Payer: Managed Care, Other (non HMO) | Admitting: Family Medicine

## 2017-10-17 ENCOUNTER — Encounter: Payer: Managed Care, Other (non HMO) | Admitting: Family Medicine

## 2017-10-27 ENCOUNTER — Inpatient Hospital Stay (HOSPITAL_COMMUNITY): Payer: Managed Care, Other (non HMO) | Attending: Adult Health

## 2017-10-27 ENCOUNTER — Encounter (HOSPITAL_COMMUNITY): Payer: Self-pay

## 2017-10-27 DIAGNOSIS — Z79899 Other long term (current) drug therapy: Secondary | ICD-10-CM | POA: Insufficient documentation

## 2017-10-27 DIAGNOSIS — I129 Hypertensive chronic kidney disease with stage 1 through stage 4 chronic kidney disease, or unspecified chronic kidney disease: Secondary | ICD-10-CM | POA: Insufficient documentation

## 2017-10-27 DIAGNOSIS — N189 Chronic kidney disease, unspecified: Secondary | ICD-10-CM | POA: Insufficient documentation

## 2017-10-27 DIAGNOSIS — E1122 Type 2 diabetes mellitus with diabetic chronic kidney disease: Secondary | ICD-10-CM | POA: Insufficient documentation

## 2017-10-27 DIAGNOSIS — Z452 Encounter for adjustment and management of vascular access device: Secondary | ICD-10-CM | POA: Insufficient documentation

## 2017-10-27 DIAGNOSIS — E538 Deficiency of other specified B group vitamins: Secondary | ICD-10-CM | POA: Insufficient documentation

## 2017-10-27 DIAGNOSIS — D509 Iron deficiency anemia, unspecified: Secondary | ICD-10-CM | POA: Insufficient documentation

## 2017-10-27 MED ORDER — SODIUM CHLORIDE 0.9% FLUSH
10.0000 mL | INTRAVENOUS | Status: DC | PRN
  Administered 2017-10-27: 10 mL via INTRAVENOUS
  Filled 2017-10-27: qty 10

## 2017-10-27 MED ORDER — HEPARIN SOD (PORK) LOCK FLUSH 100 UNIT/ML IV SOLN
500.0000 [IU] | Freq: Once | INTRAVENOUS | Status: AC
  Administered 2017-10-27: 500 [IU] via INTRAVENOUS

## 2017-10-27 NOTE — Patient Instructions (Signed)
[facility] Discharge Instructions  Portacath flushed today per protocol. Follow-up as scheduled. Call clinic for any questions or concerns   Thank you for choosing [facility] at [facility] to provide your oncology and hematology care.  To afford each patient quality time with our provider, please arrive at least 15 minutes before your scheduled appointment time.   If you have a lab appointment with the Cancer Center please come in thru the  Main Entrance and check in at the main information desk  You need to re-schedule your appointment should you arrive 10 or more minutes late.  We strive to give you quality time with our providers, and arriving late affects you and other patients whose appointments are after yours.  Also, if you no show three or more times for appointments you may be dismissed from the clinic at the providers discretion.     Again, thank you for choosing [facility].  Our hope is that these requests will decrease the amount of time that you wait before being seen by our physicians.       _____________________________________________________________  Should you have questions after your visit to [facility], please contact our office at [phone] between the hours of 8:30 a.m. and 4:30 p.m.  Voicemails left after 4:30 p.m. will not be returned until the following business day.  For prescription refill requests, have your pharmacy contact our office.       Resources For Cancer Patients and their Caregivers ? American Cancer Society: Can assist with transportation, wigs, general needs, runs Look Good Feel Better.        1-888-227-6333 ? Cancer Care: Provides financial assistance, online support groups, medication/co-pay assistance.  1-800-813-HOPE (4673) ? [facility] Assists [place] cancer patients and their families through emotional , educational and  financial support.  336-427-4357 ? [place] DSS Where to apply for food stamps, Medicaid and utility assistance. 336-342-1394 ? [facility]: Transportation to medical appointments. 336-347-2287 ? Social Security Administration: May apply for disability if have a Stage IV cancer. 336-342-7796 1-800-772-1213 ? [place] Aging, Disability and Transit Services: Assists with nutrition, care and transit needs. 336-349-2343  Cancer Center Support Programs:   > Cancer Support Group  2nd Tuesday of the month 1pm-2pm, Journey Room   > Creative Journey  3rd Tuesday of the month 1130am-1pm, Journey Room    

## 2017-10-27 NOTE — Progress Notes (Signed)
[name] tolerated portacath flush well without complaints or incident. Port accessed with 20 gauge needle with blood return noted then flushed with 10 ml NS and 5 ml Heparin easily per protocol then de-accessed. VSS Pt discharged self ambulatory using cane in satisfactory condition

## 2017-10-27 NOTE — Progress Notes (Signed)
Cardiology Office Note  Date: 10/28/2017   ID: [name], Rita Jun 16, 1946, MRN [id number]  PCP: Rita Macadam, MD  Primary Cardiologist: Rita Lesches, MD   Chief Complaint  Patient presents with  . Diastolic heart failure    History of Present Illness: [name] is a 72 y.o. female last seen in November 2018.  She presents today for a follow-up visit.  She reports no major increase in weight, still has chronic leg edema.  She has tolerated Lasix at 80 mg daily.  Last creatinine was 1.6 which was relatively stable.  I reviewed her current medications which include aspirin, Lipitor, Lasix, Toprol-XL, and myocarditis.  I reviewed her recent lab work, outlined below.  He has had recent cough and chest congestion, mild wheezing.  She previously followed regularly with Pulmonary.  She is no longer using a nebulizer at home.  Currently on Breo and Spiriva.  I personally reviewed her ECG today which shows sinus tachycardia with diffuse repolarization abnormalities.  Past Medical History:  Diagnosis Date  . Blood transfusion without reported diagnosis   . CAD (coronary artery disease)    Nonobstructive at cardiac catheterization 2000  . Cataract   . CHF (congestive heart failure) ([place])   . Chronic back pain   . Chronic kidney disease   . COPD (chronic obstructive pulmonary disease) ([place])   . Degenerative disc disease   . Essential hypertension, benign   . GERD (gastroesophageal reflux disease)   . Gout   . Hiatal hernia 07/27/2013  . History of diverticulitis of colon   . History of hiatal hernia   . Iron deficiency anemia   . Irritable bowel syndrome   . Lumbar radiculopathy   . Mixed hyperlipidemia   . Neuropathy   . Osteoporosis   . Ovarian cancer ([place]) 04/02/2016  . Oxygen deficiency   . Sleep apnea   . Type 2 diabetes mellitus ([place])   . Vitamin B deficiency 12/25/2009  . Vitamin B12 deficiency     Past Surgical History:  Procedure Laterality  Date  . ABDOMINAL HYSTERECTOMY    . BACK SURGERY    . Benign breast cysts    . CHOLECYSTECTOMY    . COLONOSCOPY  10/01/2006   SLF:Pan colonic diverticulosis and moderate internal hemorrhoids/ Otherwise no polyps, masses, inflammatory changes or AVMs/  . COLONOSCOPY  2011   SLF: pancolonic diverticulosis, large internal hemorrhoids  . COLONOSCOPY N/A 01/26/2016   Procedure: COLONOSCOPY;  Surgeon: [name], MD;  [facility];  Service: Endoscopy;  Laterality: N/A;  830   . ESOPHAGOGASTRODUODENOSCOPY  11/19/2006   SLF: Large hiatal hernia without evidence of Cameron ulcers/. Distal esophageal stricture, which allowed the gastroscope to pass without resistance.  A 16 mm Savary later passed with mild resistance/ Normal stomach.sb bx negative  . ESOPHAGOGASTRODUODENOSCOPY  10/01/2006   XNA:TFTDD hiatal hernia.  Distal esophagus without evidence of   erythema, ulceration or Barrett's esophagus  . ESOPHAGOGASTRODUODENOSCOPY  2011   SLF: large hh, distal esophageal web narrowing to 66m s/p dilation to 164m . ESOPHAGOGASTRODUODENOSCOPY N/A 08/06/2013   SLF: 1. Stricture at the gastroesophageal junction 2. large hiatal hernia. 3. Mild erosive gastritis.  . Marland KitchenIVENS CAPSULE STUDY N/A 08/06/2013   INCOMPLETE-SMALL BOWLE ULCERS  . KNEE SURGERY Right   . PARTIAL HYSTERECTOMY  1978  . small bowel capsule  2008   negative  . SPINE SURGERY    . TONSILLECTOMY AND ADENOIDECTOMY    . Two back  surgeries/fusion    . UMBILICAL HERNIA REPAIR  2010    Current Outpatient Medications  Medication Sig Dispense Refill  . allopurinol (ZYLOPRIM) 100 MG tablet Take 1 tablet by mouth 2 (two) times daily.    Marland Kitchen amitriptyline (ELAVIL) 100 MG tablet Take 100 mg by mouth at bedtime.      Marland Kitchen aspirin EC 81 MG tablet Take 81 mg by mouth daily.    Marland Kitchen atorvastatin (LIPITOR) 40 MG tablet Take 1 tablet by mouth at bedtime.     . B-D ULTRA-FINE 33 LANCETS MISC USE TO CHECK BG 3 TIMES A DAY    . BIOTIN PO Take 1  capsule by mouth daily.     . cyanocobalamin (,VITAMIN B-12,) 1000 MCG/ML injection INJECT 1 ML INTRAMUSCULARLY EVERY 30 DAYS 3 mL 2  . esomeprazole (NEXIUM) 40 MG capsule TAKE 1 CAPSULE TWICE A DAY 30 MINUTES PRIOR TO MEALS 180 capsule 1  . famotidine (PEPCID) 40 MG tablet TAKE 1 TABLET AT BEDTIME AS NEEDED TO CONTROL REFLUX (Patient taking differently: Take 40 mg by mouth at bedtime as needed for heartburn or indigestion. ) 90 tablet 1  . fluticasone furoate-vilanterol (BREO ELLIPTA) 100-25 MCG/INH AEPB Inhale 1 puff into the lungs daily.    Marland Kitchen gabapentin (NEURONTIN) 300 MG capsule Take 1 capsule by mouth 2 (two) times daily.    . insulin lispro (HUMALOG) 100 UNIT/ML KiwkPen Use as directed [place] by sliding scale with meals. MDD=50 units    . Insulin Pen Needle (FIFTY50 PEN NEEDLES) 32G X 4 MM MISC USE TO ADMINISTER INSULIN 4 TIMES DAILY    . LANTUS SOLOSTAR 100 UNIT/ML SOPN Inject 20 Units into the skin at bedtime.     Marland Kitchen loperamide (IMODIUM) 2 MG capsule Take 2 mg by mouth as needed for diarrhea or loose stools.    . meclizine (ANTIVERT) 25 MG tablet Take 1 tablet (25 mg total) by mouth 3 (three) times daily as needed for dizziness. 30 tablet 0  . metoprolol succinate (TOPROL-XL) 50 MG 24 hr tablet Take 50 mg by mouth daily. Take with or immediately following a meal.    . Misc. Devices MISC 3cc , 25G x 1" VanishPoint Syringe for Vitamin B12 injections monthly 12 each 0  . telmisartan (MICARDIS) 80 MG tablet Take 1 tablet (80 mg total) by mouth daily. 90 tablet 3  . tiotropium (SPIRIVA) 18 MCG inhalation capsule Place 18 mcg into inhaler and inhale daily.    . Vitamin D, Ergocalciferol, (DRISDOL) 50000 units CAPS capsule Take 1 capsule (50,000 Units total) by mouth every 7 (seven) days. 12 capsule 0  . furosemide (LASIX) 80 MG tablet Take 1 tablet (80 mg total) by mouth daily. Take 80 mg alternating with 60 mg every other day 90 tablet 1   No current facility-administered medications for this visit.     Allergies:  Patient has no known allergies.   Social History: The patient  reports that she quit smoking about 56 years ago. Her smoking use included cigarettes. She started smoking about 56 years ago. She has a 1.00 pack-year smoking history. She has never used smokeless tobacco. She reports that she does not drink alcohol or use drugs.   ROS:  Please see the history of present illness. Otherwise, complete review of systems is positive for intermittent cough, no fevers or chills.  All other systems are reviewed and negative.   Physical Exam: VS:  BP (!) 190/96 (BP Location: Right Arm)   Pulse 100  Ht 4' 8"  (1.422 m)   Wt 224 lb (101.6 kg)   SpO2 92%   BMI 50.22 kg/m , BMI Body mass index is 50.22 kg/m.  Wt Readings from Last 3 Encounters:  10/28/17 224 lb (101.6 kg)  10/14/17 229 lb (103.9 kg)  10/01/17 229 lb (103.9 kg)    General: No acute distress. HEENT: Conjunctiva and lids normal, oropharynx clear. Neck: Supple, no elevated JVP or carotid bruits, no thyromegaly. Lungs: Prolonged expiratory phase with end expiratory wheeze. Cardiac: Regular rate and rhythm, S4, no significant systolic murmur. Abdomen: Soft, nontender, bowel sounds present. Extremities: Chronic appearing leg edema and stasis, distal pulses 2+. Skin: Warm and dry. Musculoskeletal: No kyphosis. Neuropsychiatric: Alert and oriented x3, affect grossly appropriate.  ECG: I personally reviewed the tracing from 05/08/2016 which showed sinus tachycardia with nonspecific ST changes.  Recent Labwork: 08/28/2017: Hemoglobin 11.0; Platelets 256 10/10/2017: ALT 7; AST 10; BUN 28; Creat 1.68; Potassium 5.4; Sodium 140     Component Value Date/Time   CHOL 257 (H) 10/10/2017 1050   TRIG 177 (H) 10/10/2017 1050   HDL 38 (L) 10/10/2017 1050   CHOLHDL 6.8 (H) 10/10/2017 1050   LDLCALC 186 (H) 10/10/2017 1050    Other Studies Reviewed Today:  Echocardiogram 05/10/2016: Study Conclusions  - Left ventricle: The  cavity size was normal. Wall thickness was increased in a pattern of mild LVH. There was moderate focal basal hypertrophy of the septum. Systolic function was normal. The estimated ejection fraction was in the range of 60% to 65%. Wall motion was normal; there were no regional wall motion abnormalities. Features are consistent with a pseudonormal left ventricular filling pattern, with concomitant abnormal relaxation and increased filling pressure (grade 2 diastolic dysfunction). - Aortic valve: Mildly calcified annulus. Trileaflet; mildly calcified leaflets. There was trivial regurgitation. Mean gradient (S): 5 mm Hg. Valve area (Vmax): 1.92 cm^2. - Mitral valve: Calcified annulus. Mildly thickened leaflets . There was mild regurgitation. - Left atrium: The atrium was moderately dilated. - Right atrium: Central venous pressure (est): 3 mm Hg. - Tricuspid valve: There was trivial regurgitation. - Pulmonary arteries: PA peak pressure: 43 mm Hg (S). - Pericardium, extracardiac: Small circumferential pericardial effusion, possibly somewhat larger collection posteriorly.  Impressions:  - Mild LVH with LVEF 60-65%. Grade 2 diastolic dysfunction with increased LV filling pressure. Moderate left atrial enlargement. MAC with mildly thickened and calcified mitral leaflets, mild mitral regurgitation. Sclerotic aortic valve with trivial aortic regurgitation. Trivial tricuspid regurgitation with PASP 43 mmHg. Small circumferential pericardial effusion, possibly somewhat larger collection posteriorly.  Assessment and Plan:  1.  Chronic diastolic heart failure with leg edema and lymphedema.  Lasix has been increased to 80 mg daily.  2.  History of COPD, has URI symptoms now.  She has not had regular Pulmonary follow-up.  We are arranging consultation with Dr. Luan Pulling.  3.  Nonobstructive CAD by prior workup, no active angina symptoms.  She continues on aspirin  and statin.  4.  CKD stage III, creatinine 1.6.  Current medicines were reviewed with the patient today.   Orders Placed This Encounter  Procedures  . Ambulatory referral to Pulmonology  . EKG 12-Lead    Disposition: Follow-up in 6 months.  Signed, Satira Sark, MD, [facility] 10/28/2017 4:52 PM    [facility] [place], [place], [place] [address] Phone: [phone]; Fax: [phone]

## 2017-10-28 ENCOUNTER — Encounter: Payer: Self-pay | Admitting: Cardiology

## 2017-10-28 ENCOUNTER — Ambulatory Visit (INDEPENDENT_AMBULATORY_CARE_PROVIDER_SITE_OTHER): Payer: Managed Care, Other (non HMO) | Admitting: Cardiology

## 2017-10-28 ENCOUNTER — Encounter: Payer: Self-pay | Admitting: Gastroenterology

## 2017-10-28 VITALS — BP 190/96 | HR 100 | Ht <= 58 in | Wt 224.0 lb

## 2017-10-28 DIAGNOSIS — J449 Chronic obstructive pulmonary disease, unspecified: Secondary | ICD-10-CM

## 2017-10-28 DIAGNOSIS — N183 Chronic kidney disease, stage 3 unspecified: Secondary | ICD-10-CM

## 2017-10-28 DIAGNOSIS — I25810 Atherosclerosis of coronary artery bypass graft(s) without angina pectoris: Secondary | ICD-10-CM

## 2017-10-28 DIAGNOSIS — I5032 Chronic diastolic (congestive) heart failure: Secondary | ICD-10-CM

## 2017-10-28 NOTE — Patient Instructions (Signed)
Your physician wants you to follow-up in: [place] will receive a reminder letter in the mail two months in advance. If you don't receive a letter, please call our office to schedule the follow-up appointment.  Your physician recommends that you continue on your current medications as directed. Please refer to the Current Medication list given to you today.  You have been referred to DR [facility]   Thank you for choosing [facility]!!

## 2017-12-25 ENCOUNTER — Other Ambulatory Visit (HOSPITAL_COMMUNITY): Payer: Self-pay

## 2017-12-25 DIAGNOSIS — D649 Anemia, unspecified: Secondary | ICD-10-CM

## 2017-12-26 ENCOUNTER — Other Ambulatory Visit (HOSPITAL_COMMUNITY): Payer: Managed Care, Other (non HMO)

## 2017-12-26 ENCOUNTER — Ambulatory Visit (HOSPITAL_COMMUNITY): Payer: Managed Care, Other (non HMO) | Admitting: Internal Medicine

## 2017-12-31 ENCOUNTER — Other Ambulatory Visit (HOSPITAL_COMMUNITY): Payer: Managed Care, Other (non HMO)

## 2018-01-07 ENCOUNTER — Ambulatory Visit (HOSPITAL_COMMUNITY): Payer: Managed Care, Other (non HMO) | Admitting: Internal Medicine

## 2018-01-08 ENCOUNTER — Encounter: Payer: Self-pay | Admitting: Family Medicine

## 2018-01-09 ENCOUNTER — Encounter: Payer: Self-pay | Admitting: Family Medicine

## 2018-01-19 ENCOUNTER — Other Ambulatory Visit: Payer: Self-pay | Admitting: Gastroenterology

## 2018-03-09 ENCOUNTER — Other Ambulatory Visit (HOSPITAL_COMMUNITY): Payer: Self-pay | Admitting: *Deleted

## 2018-03-09 DIAGNOSIS — E539 Vitamin B deficiency, unspecified: Secondary | ICD-10-CM

## 2018-03-09 MED ORDER — CYANOCOBALAMIN 1000 MCG/ML IJ SOLN: INTRAMUSCULAR | 2 refills | Status: DC

## 2018-05-01 NOTE — Progress Notes (Signed)
Cardiology Office Note  Date: 05/04/2018   ID: Rita, [name] 04-10-1946, MRN [id number]  PCP: [name], MD  Primary Cardiologist: [name], MD   Chief Complaint  Patient presents with  . Cardiac follow-up    History of Present Illness: [name] is a 72 y.o. female last seen in March.  She is here today for a follow-up visit.  She tells me that her husband passed away with cancer back in July, she is still going through the grieving process.  She has not been eating regularly and not taking her medications regularly.  Weight is down about 20 pounds.  Still having trouble with leg swelling.  No chest pain however.  I reviewed her medications and we discussed making sure she has refills for her cardiac medicines.  She will plan to resume at previous dosing, however we will bring her back sooner than usual to see if any adjustments need to be made including reassessment of renal function.  Past Medical History:  Diagnosis Date  . Blood transfusion without reported diagnosis   . CAD (coronary artery disease)    Nonobstructive at cardiac catheterization 2000  . Cataract   . CHF (congestive heart failure) ([place])   . Chronic back pain   . Chronic kidney disease   . COPD (chronic obstructive pulmonary disease) ([place])   . Degenerative disc disease   . Essential hypertension, benign   . GERD (gastroesophageal reflux disease)   . Gout   . Hiatal hernia 07/27/2013  . History of diverticulitis of colon   . History of hiatal hernia   . Iron deficiency anemia   . Irritable bowel syndrome   . Lumbar radiculopathy   . Mixed hyperlipidemia   . Neuropathy   . Osteoporosis   . Ovarian cancer ([place]) 04/02/2016  . Oxygen deficiency   . Sleep apnea   . Type 2 diabetes mellitus ([place])   . Vitamin B deficiency 12/25/2009  . Vitamin B12 deficiency     Past Surgical History:  Procedure Laterality Date  . ABDOMINAL HYSTERECTOMY    . BACK SURGERY    . Benign  breast cysts    . CHOLECYSTECTOMY    . COLONOSCOPY  10/01/2006   SLF:Pan colonic diverticulosis and moderate internal hemorrhoids/ Otherwise no polyps, masses, inflammatory changes or AVMs/  . COLONOSCOPY  2011   SLF: pancolonic diverticulosis, large internal hemorrhoids  . COLONOSCOPY N/A 01/26/2016   Procedure: COLONOSCOPY;  Surgeon: [name], MD;  [facility];  Service: Endoscopy;  Laterality: N/A;  830   . ESOPHAGOGASTRODUODENOSCOPY  11/19/2006   SLF: Large hiatal hernia without evidence of Cameron ulcers/. Distal esophageal stricture, which allowed the gastroscope to pass without resistance.  A 16 mm Savary later passed with mild resistance/ Normal stomach.sb bx negative  . ESOPHAGOGASTRODUODENOSCOPY  10/01/2006   AJG:OTLXB hiatal hernia.  Distal esophagus without evidence of   erythema, ulceration or Barrett's esophagus  . ESOPHAGOGASTRODUODENOSCOPY  2011   SLF: large hh, distal esophageal web narrowing to 66m s/p dilation to 163m . ESOPHAGOGASTRODUODENOSCOPY N/A 08/06/2013   SLF: 1. Stricture at the gastroesophageal junction 2. large hiatal hernia. 3. Mild erosive gastritis.  . Marland KitchenIVENS CAPSULE STUDY N/A 08/06/2013   INCOMPLETE-SMALL BOWLE ULCERS  . KNEE SURGERY Right   . PARTIAL HYSTERECTOMY  1978  . small bowel capsule  2008   negative  . SPINE SURGERY    . TONSILLECTOMY AND ADENOIDECTOMY    . Two back  surgeries/fusion    . UMBILICAL HERNIA REPAIR  2010    Current Outpatient Medications  Medication Sig Dispense Refill  . allopurinol (ZYLOPRIM) 100 MG tablet Take 1 tablet by mouth 2 (two) times daily.    Marland Kitchen amitriptyline (ELAVIL) 100 MG tablet Take 100 mg by mouth at bedtime.      Marland Kitchen aspirin EC 81 MG tablet Take 81 mg by mouth daily.    Marland Kitchen atorvastatin (LIPITOR) 40 MG tablet Take 1 tablet (40 mg total) by mouth at bedtime. 90 tablet 1  . B-D ULTRA-FINE 33 LANCETS MISC USE TO CHECK BG 3 TIMES A DAY    . BIOTIN PO Take 1 capsule by mouth daily.     Marland Kitchen esomeprazole  (NEXIUM) 40 MG capsule TAKE 1 CAPSULE TWICE A DAY 30 MINUTES PRIOR TO MEALS 180 capsule 1  . famotidine (PEPCID) 40 MG tablet TAKE 1 TABLET AT BEDTIME AS NEEDED TO CONTROL REFLUX (Patient taking differently: Take 40 mg by mouth at bedtime as needed for heartburn or indigestion. ) 90 tablet 1  . furosemide (LASIX) 80 MG tablet Take 1 tablet (80 mg total) by mouth daily. Take 80 mg alternating with 60 mg every other day 90 tablet 1  . gabapentin (NEURONTIN) 300 MG capsule Take 1 capsule by mouth 2 (two) times daily.    . insulin lispro (HUMALOG) 100 UNIT/ML KiwkPen Use as directed [place] by sliding scale with meals. MDD=50 units    . Insulin Pen Needle (FIFTY50 PEN NEEDLES) 32G X 4 MM MISC USE TO ADMINISTER INSULIN 4 TIMES DAILY    . LANTUS SOLOSTAR 100 UNIT/ML SOPN Inject 20 Units into the skin at bedtime.     Marland Kitchen loperamide (IMODIUM) 2 MG capsule Take 2 mg by mouth as needed for diarrhea or loose stools.    . meclizine (ANTIVERT) 25 MG tablet Take 1 tablet (25 mg total) by mouth 3 (three) times daily as needed for dizziness. 30 tablet 0  . metoprolol succinate (TOPROL-XL) 50 MG 24 hr tablet Take 1 tablet (50 mg total) by mouth daily. Take with or immediately following a meal. 90 tablet 1  . Misc. Devices MISC 3cc , 25G x 1" VanishPoint Syringe for Vitamin B12 injections monthly 12 each 0  . telmisartan (MICARDIS) 80 MG tablet Take 1 tablet (80 mg total) by mouth daily. 90 tablet 1   No current facility-administered medications for this visit.    Allergies:  Patient has no known allergies.   Social History: The patient  reports that she quit smoking about 56 years ago. Her smoking use included cigarettes. She started smoking about 57 years ago. She has a 1.00 pack-year smoking history. She has never used smokeless tobacco. She reports that she does not drink alcohol or use drugs.   ROS:  Please see the history of present illness. Otherwise, complete review of systems is positive for insomnia.  All other  systems are reviewed and negative.   Physical Exam: VS:  BP (!) 160/82   Pulse 95   Ht _0  (1.473 m)   Wt 206 lb 3.2 oz (93.5 kg)   SpO2 96%   BMI 43.10 kg/m , BMI Body mass index is 43.1 kg/m.  Wt Readings from Last 3 Encounters:  05/04/18 206 lb 3.2 oz (93.5 kg)  10/28/17 224 lb (101.6 kg)  10/14/17 229 lb (103.9 kg)    General: Overweight woman, appears comfortable at rest. HEENT: Conjunctiva and lids normal, oropharynx clear. Neck: Supple, no elevated JVP or  carotid bruits, no thyromegaly. Lungs: Clear to auscultation, nonlabored breathing at rest. Cardiac: Regular rate and rhythm, S4, no significant systolic murmur. Abdomen: Soft, nontender, bowel sounds present. Extremities: Bilateral lower extremity edema and venous stasis, distal pulses 2+. Skin: Warm and dry. Musculoskeletal: No kyphosis. Neuropsychiatric: Alert and oriented x3, affect grossly appropriate.  ECG: I personally reviewed the tracing from 10/28/2017 which showed sinus tachycardia with diffuse repolarization abnormalities.  Recent Labwork: 08/28/2017: Hemoglobin 11.0; Platelets 256 10/10/2017: ALT 7; AST 10; BUN 28; Creat 1.68; Potassium 5.4; Sodium 140     Component Value Date/Time   CHOL 257 (H) 10/10/2017 1050   TRIG 177 (H) 10/10/2017 1050   HDL 38 (L) 10/10/2017 1050   CHOLHDL 6.8 (H) 10/10/2017 1050   LDLCALC 186 (H) 10/10/2017 1050    Other Studies Reviewed Today:  Echocardiogram10/01/2016: Study Conclusions  - Left ventricle: The cavity size was normal. Wall thickness was increased in a pattern of mild LVH. There was moderate focal basal hypertrophy of the septum. Systolic function was normal. The estimated ejection fraction was in the range of 60% to 65%. Wall motion was normal; there were no regional wall motion abnormalities. Features are consistent with a pseudonormal left ventricular filling pattern, with concomitant abnormal relaxation and increased filling pressure  (grade 2 diastolic dysfunction). - Aortic valve: Mildly calcified annulus. Trileaflet; mildly calcified leaflets. There was trivial regurgitation. Mean gradient (S): 5 mm Hg. Valve area (Vmax): 1.92 cm^2. - Mitral valve: Calcified annulus. Mildly thickened leaflets . There was mild regurgitation. - Left atrium: The atrium was moderately dilated. - Right atrium: Central venous pressure (est): 3 mm Hg. - Tricuspid valve: There was trivial regurgitation. - Pulmonary arteries: PA peak pressure: 43 mm Hg (S). - Pericardium, extracardiac: Small circumferential pericardial effusion, possibly somewhat larger collection posteriorly.  Impressions:  - Mild LVH with LVEF 60-65%. Grade 2 diastolic dysfunction with increased LV filling pressure. Moderate left atrial enlargement. MAC with mildly thickened and calcified mitral leaflets, mild mitral regurgitation. Sclerotic aortic valve with trivial aortic regurgitation. Trivial tricuspid regurgitation with PASP 43 mmHg. Small circumferential pericardial effusion, possibly somewhat larger collection posteriorly.  Assessment and Plan:  1.  Chronic diastolic heart failure and lymphedema.  Patient's weight is down due to decreased caloric intake and currently grieving the loss of her husband in July.  She has not been consistent with Lasix however.  Cardiac regimen refilled and being resumed.  We will plan to see her back in about 4 to 6 weeks with repeat BMET.   2.  Nonobstructive CAD without active angina.  Resume aspirin and statin.  3.  CKD stage III, last creatinine 1.68.  Will recheck BMET for her next visit.  Current medicines were reviewed with the patient today.   Orders Placed This Encounter  Procedures  . Basic Metabolic Panel (BMET)    Disposition: Follow-up in 4 to 6 weeks.  Signed, Satira Sark, MD, [facility] 05/04/2018 1:30 PM    [facility] at [address], [place], [place]  [address] Phone: [phone]; Fax: [phone]

## 2018-05-04 ENCOUNTER — Ambulatory Visit (INDEPENDENT_AMBULATORY_CARE_PROVIDER_SITE_OTHER): Payer: Medicare Other | Admitting: Cardiology

## 2018-05-04 ENCOUNTER — Encounter: Payer: Self-pay | Admitting: Cardiology

## 2018-05-04 VITALS — BP 160/82 | HR 95 | Ht <= 58 in | Wt 206.2 lb

## 2018-05-04 DIAGNOSIS — I25810 Atherosclerosis of coronary artery bypass graft(s) without angina pectoris: Secondary | ICD-10-CM

## 2018-05-04 DIAGNOSIS — I5032 Chronic diastolic (congestive) heart failure: Secondary | ICD-10-CM

## 2018-05-04 DIAGNOSIS — N183 Chronic kidney disease, stage 3 unspecified: Secondary | ICD-10-CM

## 2018-05-04 MED ORDER — METOPROLOL SUCCINATE ER 50 MG PO TB24: 50.0000 mg | ORAL_TABLET | Freq: Every day | ORAL | 1 refills | Status: DC

## 2018-05-04 MED ORDER — TELMISARTAN 80 MG PO TABS: 80.0000 mg | ORAL_TABLET | Freq: Every day | ORAL | 1 refills | Status: DC

## 2018-05-04 MED ORDER — ATORVASTATIN CALCIUM 40 MG PO TABS: 40.0000 mg | ORAL_TABLET | Freq: Every day | ORAL | 1 refills | Status: DC

## 2018-05-04 NOTE — Patient Instructions (Signed)
Your physician recommends that you schedule a follow-up appointment in: [place]  Your physician recommends that you continue on your current medications as directed. Please refer to the Current Medication list given to you today.  Your physician recommends that you return for lab work JUST BEFORE YOUR NEXT APPOINTMENT - [place]  Thank you for choosing [facility]!!

## 2018-06-05 ENCOUNTER — Telehealth: Payer: Self-pay | Admitting: *Deleted

## 2018-06-05 MED ORDER — TELMISARTAN 40 MG PO TABS: 40.0000 mg | ORAL_TABLET | Freq: Every day | ORAL | 1 refills | Status: DC

## 2018-06-05 NOTE — Telephone Encounter (Signed)
-----   Message from Satira Sark, MD sent at 06/05/2018  8:16 AM EDT ----- Results reviewed.  BUN and creatinine 22 and 1.55, degree of renal insufficiency is relatively stable.  She does have hyperkalemia with potassium of 6.0, currently not on potassium supplements.  Would suggest that she cut her Micardis from 80 mg daily to 40 mg daily.  PCP is Dr. Lorra Hals. A copy of this test should be forwarded to [name], MD.

## 2018-06-05 NOTE — Telephone Encounter (Signed)
Patient informed and verbalized understanding of plan. Patient said she has an appointment with her PCP on Tuesday 06/09/18. Copy sent to PCP.

## 2018-06-10 ENCOUNTER — Encounter: Payer: Self-pay | Admitting: Cardiology

## 2018-06-12 ENCOUNTER — Telehealth: Payer: Self-pay | Admitting: *Deleted

## 2018-06-12 NOTE — Telephone Encounter (Signed)
-----   Message from Satira Sark, MD sent at 06/12/2018 10:42 AM EST ----- Results reviewed.  Follow-up lab work per Dr. Lorra Hals.  Creatinine is down to 1.27 from 1.55 and potassium is decreased to 5.5 from 6.0.

## 2018-06-12 NOTE — Telephone Encounter (Signed)
Patient informed. 

## 2018-06-17 NOTE — Progress Notes (Signed)
Cardiology Office Note  Date: 06/18/2018   ID: [name], DOB 01-15-1946, MRN [id number]  PCP: Rita Mealy, MD  Primary Cardiologist: Rita Lesches, MD   Chief Complaint  Patient presents with  . Cardiac follow-up    History of Present Illness: [name] is a 72 y.o. female last seen in September.  She is here today for a follow-up visit.  We resumed her diuretics at the last encounter.  She has had follow-up with her PCP with lab work showing hyperkalemia and subsequently Micardis dose was reduced.  Her blood pressure remains elevated however.  Today we went over her medications and discussed stopping Micardis, replacing it with Norvasc 5 mg daily.  Recent follow-up lab work is noted below.  We also discussed her lymphedema.  She would likely benefit from mechanical compression/wraps.  She has not been able to tolerate compression stockings well.  I discussed with her referral to the wound center here in [place].  Do not plan to further advance diuretics at this point.  Past Medical History:  Diagnosis Date  . Blood transfusion without reported diagnosis   . CAD (coronary artery disease)    Nonobstructive at cardiac catheterization 2000  . Cataract   . CHF (congestive heart failure) ([place])   . Chronic back pain   . Chronic kidney disease   . COPD (chronic obstructive pulmonary disease) ([place])   . Degenerative disc disease   . Essential hypertension, benign   . GERD (gastroesophageal reflux disease)   . Gout   . Hiatal hernia 07/27/2013  . History of diverticulitis of colon   . History of hiatal hernia   . Iron deficiency anemia   . Irritable bowel syndrome   . Lumbar radiculopathy   . Mixed hyperlipidemia   . Neuropathy   . Osteoporosis   . Ovarian cancer ([place]) 04/02/2016  . Oxygen deficiency   . Sleep apnea   . Type 2 diabetes mellitus ([place])   . Vitamin B deficiency 12/25/2009  . Vitamin B12 deficiency     Past Surgical History:    Procedure Laterality Date  . ABDOMINAL HYSTERECTOMY    . BACK SURGERY    . Benign breast cysts    . CHOLECYSTECTOMY    . COLONOSCOPY  10/01/2006   SLF:Pan colonic diverticulosis and moderate internal hemorrhoids/ Otherwise no polyps, masses, inflammatory changes or AVMs/  . COLONOSCOPY  2011   SLF: pancolonic diverticulosis, large internal hemorrhoids  . COLONOSCOPY N/A 01/26/2016   Procedure: COLONOSCOPY;  Surgeon: [name], MD;  Location[facility];  Service: Endoscopy;  Laterality: N/A;  830   . ESOPHAGOGASTRODUODENOSCOPY  11/19/2006   SLF: Large hiatal hernia without evidence of Cameron ulcers/. Distal esophageal stricture, which allowed the gastroscope to pass without resistance.  A 16 mm Savary later passed with mild resistance/ Normal stomach.sb bx negative  . ESOPHAGOGASTRODUODENOSCOPY  10/01/2006   UMP:NTIRW hiatal hernia.  Distal esophagus without evidence of   erythema, ulceration or Barrett's esophagus  . ESOPHAGOGASTRODUODENOSCOPY  2011   SLF: large hh, distal esophageal web narrowing to 33m s/p dilation to 187m . ESOPHAGOGASTRODUODENOSCOPY N/A 08/06/2013   SLF: 1. Stricture at the gastroesophageal junction 2. large hiatal hernia. 3. Mild erosive gastritis.  . Marland KitchenIVENS CAPSULE STUDY N/A 08/06/2013   INCOMPLETE-SMALL BOWLE ULCERS  . KNEE SURGERY Right   . PARTIAL HYSTERECTOMY  1978  . small bowel capsule  2008   negative  . SPINE SURGERY    . TONSILLECTOMY  AND ADENOIDECTOMY    . Two back surgeries/fusion    . UMBILICAL HERNIA REPAIR  2010    Current Outpatient Medications  Medication Sig Dispense Refill  . allopurinol (ZYLOPRIM) 100 MG tablet Take 1 tablet by mouth 2 (two) times daily.    Marland Kitchen amitriptyline (ELAVIL) 100 MG tablet Take 100 mg by mouth at bedtime.      Marland Kitchen aspirin EC 81 MG tablet Take 81 mg by mouth daily.    Marland Kitchen atorvastatin (LIPITOR) 40 MG tablet Take 1 tablet (40 mg total) by mouth at bedtime. 90 tablet 1  . B-D ULTRA-FINE 33 LANCETS MISC USE TO  CHECK BG 3 TIMES A DAY    . BIOTIN PO Take 1 capsule by mouth daily.     Marland Kitchen esomeprazole (NEXIUM) 40 MG capsule TAKE 1 CAPSULE TWICE A DAY 30 MINUTES PRIOR TO MEALS 180 capsule 1  . famotidine (PEPCID) 40 MG tablet TAKE 1 TABLET AT BEDTIME AS NEEDED TO CONTROL REFLUX (Patient taking differently: Take 40 mg by mouth at bedtime as needed for heartburn or indigestion. ) 90 tablet 1  . furosemide (LASIX) 80 MG tablet Take 1 tablet (80 mg total) by mouth daily. Take 80 mg alternating with 60 mg every other day 90 tablet 1  . gabapentin (NEURONTIN) 300 MG capsule Take 1 capsule by mouth 2 (two) times daily.    . insulin lispro (HUMALOG) 100 UNIT/ML KiwkPen Use as directed [place] by sliding scale with meals. MDD=50 units    . Insulin Pen Needle (FIFTY50 PEN NEEDLES) 32G X 4 MM MISC USE TO ADMINISTER INSULIN 4 TIMES DAILY    . LANTUS SOLOSTAR 100 UNIT/ML SOPN Inject 20 Units into the skin at bedtime.     Marland Kitchen loperamide (IMODIUM) 2 MG capsule Take 2 mg by mouth as needed for diarrhea or loose stools.    . meclizine (ANTIVERT) 25 MG tablet Take 1 tablet (25 mg total) by mouth 3 (three) times daily as needed for dizziness. 30 tablet 0  . metoprolol succinate (TOPROL-XL) 50 MG 24 hr tablet Take 1 tablet (50 mg total) by mouth daily. Take with or immediately following a meal. 90 tablet 1  . Misc. Devices MISC 3cc , 25G x 1" VanishPoint Syringe for Vitamin B12 injections monthly 12 each 0   No current facility-administered medications for this visit.    Allergies:  Patient has no known allergies.   Social History: The patient  reports that she quit smoking about 56 years ago. Her smoking use included cigarettes. She started smoking about 57 years ago. She has a 1.00 pack-year smoking history. She has never used smokeless tobacco. She reports that she does not drink alcohol or use drugs.   ROS:  Please see the history of present illness. Otherwise, complete review of systems is positive for grief following the death  of her husband.  All other systems are reviewed and negative.   Physical Exam: VS:  BP (!) 170/92   Pulse 90   Ht _0  (1.422 m)   Wt 208 lb (94.3 kg)   SpO2 98%   BMI 46.63 kg/m , BMI Body mass index is 46.63 kg/m.  Wt Readings from Last 3 Encounters:  06/18/18 208 lb (94.3 kg)  05/04/18 206 lb 3.2 oz (93.5 kg)  10/28/17 224 lb (101.6 kg)    General: Patient appears comfortable at rest. HEENT: Conjunctiva and lids normal, oropharynx clear. Neck: Supple, no elevated JVP or carotid bruits, no thyromegaly. Lungs: Clear to auscultation,  nonlabored breathing at rest. Cardiac: Regular rate and rhythm, S4, no significant systolic murmur. Abdomen: Soft, nontender, bowel sounds present. Extremities: Bilateral leg lymphedema, venous stasis with ulceration, distal pulses 1+. Skin: Warm and dry. Musculoskeletal: No kyphosis. Neuropsychiatric: Alert and oriented x3, affect grossly appropriate.  ECG: I personally reviewed the tracing from 3/26 2019 which showed sinus tachycardia with diffuse repolarization abnormalities.  Recent Labwork: 08/28/2017: Hemoglobin 11.0; Platelets 256 10/10/2017: ALT 7; AST 10; BUN 28; Creat 1.68; Potassium 5.4; Sodium 140     Component Value Date/Time   CHOL 257 (H) 10/10/2017 1050   TRIG 177 (H) 10/10/2017 1050   HDL 38 (L) 10/10/2017 1050   CHOLHDL 6.8 (H) 10/10/2017 1050   LDLCALC 186 (H) 10/10/2017 1050  November 2019: BUN 22, creatinine 1.55, potassium 5.5  Other Studies Reviewed Today:  Echocardiogram10/01/2016: Study Conclusions  - Left ventricle: The cavity size was normal. Wall thickness was increased in a pattern of mild LVH. There was moderate focal basal hypertrophy of the septum. Systolic function was normal. The estimated ejection fraction was in the range of 60% to 65%. Wall motion was normal; there were no regional wall motion abnormalities. Features are consistent with a pseudonormal left ventricular filling pattern,  with concomitant abnormal relaxation and increased filling pressure (grade 2 diastolic dysfunction). - Aortic valve: Mildly calcified annulus. Trileaflet; mildly calcified leaflets. There was trivial regurgitation. Mean gradient (S): 5 mm Hg. Valve area (Vmax): 1.92 cm^2. - Mitral valve: Calcified annulus. Mildly thickened leaflets . There was mild regurgitation. - Left atrium: The atrium was moderately dilated. - Right atrium: Central venous pressure (est): 3 mm Hg. - Tricuspid valve: There was trivial regurgitation. - Pulmonary arteries: PA peak pressure: 43 mm Hg (S). - Pericardium, extracardiac: Small circumferential pericardial effusion, possibly somewhat larger collection posteriorly.  Impressions:  - Mild LVH with LVEF 60-65%. Grade 2 diastolic dysfunction with increased LV filling pressure. Moderate left atrial enlargement. MAC with mildly thickened and calcified mitral leaflets, mild mitral regurgitation. Sclerotic aortic valve with trivial aortic regurgitation. Trivial tricuspid regurgitation with PASP 43 mmHg. Small circumferential pericardial effusion, possibly somewhat larger collection posteriorly.  Assessment and Plan:  1.  Nonobstructive CAD, no active angina symptoms reported.  She is on aspirin and statin therapy.  2.  Essential hypertension, blood pressure not well controlled.  In light of hyperkalemia in the setting of CKD stage 3, Micardis will be discontinued.  We will start Norvasc 5 mg daily, otherwise continue Toprol-XL.  3.  Lymphedema.  Continue diuretics.  Referral being made to the wound center in [facility] for compression wraps and further evaluation.  4.  CKD stage 3, follow-up creatinine 1.55.  Current medicines were reviewed with the patient today.  Disposition: Follow-up in 2 weeks for nurse visit blood pressure check.  Signed, Satira Sark, MD, [facility] 06/18/2018 3:17 PM    [facility] at  [place], [place], [place] [address] Phone: [phone]; Fax: [phone]

## 2018-06-18 ENCOUNTER — Encounter: Payer: Self-pay | Admitting: Cardiology

## 2018-06-18 ENCOUNTER — Ambulatory Visit (INDEPENDENT_AMBULATORY_CARE_PROVIDER_SITE_OTHER): Payer: Medicare Other | Admitting: Cardiology

## 2018-06-18 VITALS — BP 170/92 | HR 90 | Ht <= 58 in | Wt 208.0 lb

## 2018-06-18 DIAGNOSIS — I890 Lymphedema, not elsewhere classified: Secondary | ICD-10-CM

## 2018-06-18 DIAGNOSIS — L97929 Non-pressure chronic ulcer of unspecified part of left lower leg with unspecified severity: Secondary | ICD-10-CM

## 2018-06-18 DIAGNOSIS — I10 Essential (primary) hypertension: Secondary | ICD-10-CM

## 2018-06-18 DIAGNOSIS — L97919 Non-pressure chronic ulcer of unspecified part of right lower leg with unspecified severity: Secondary | ICD-10-CM | POA: Diagnosis not present

## 2018-06-18 DIAGNOSIS — I5032 Chronic diastolic (congestive) heart failure: Secondary | ICD-10-CM | POA: Diagnosis not present

## 2018-06-18 DIAGNOSIS — N183 Chronic kidney disease, stage 3 unspecified: Secondary | ICD-10-CM

## 2018-06-18 DIAGNOSIS — I25810 Atherosclerosis of coronary artery bypass graft(s) without angina pectoris: Secondary | ICD-10-CM

## 2018-06-18 MED ORDER — AMLODIPINE BESYLATE 5 MG PO TABS: 5.0000 mg | ORAL_TABLET | Freq: Every day | ORAL | 3 refills | Status: DC

## 2018-06-18 NOTE — Patient Instructions (Addendum)
Medication Instructions:   Your physician has recommended you make the following change in your medication:   Stop micardis  Start amlodipine 5 mg by mouth daily  Continue all other medications the same.  Labwork:  NONE  Testing/Procedures:  NONE  Follow-Up:  Your physician recommends that you schedule a follow-up appointment in: 6 months. You will receive a reminder letter in the mail in about 4 months reminding you to call and schedule your appointment. If you don't receive this letter, please contact our office.  Your physician recommends that you schedule an appointment in: 2 weeks for a nurse blood pressure check.  You have been referred to the Wound Clinic in [facility])  Any Other Special Instructions Will Be Listed Below (If Applicable).  If you need a refill on your cardiac medications before your next appointment, please call your pharmacy.

## 2018-06-30 ENCOUNTER — Ambulatory Visit (INDEPENDENT_AMBULATORY_CARE_PROVIDER_SITE_OTHER): Payer: Medicare Other | Admitting: *Deleted

## 2018-06-30 VITALS — BP 148/90 | HR 88

## 2018-06-30 DIAGNOSIS — I10 Essential (primary) hypertension: Secondary | ICD-10-CM | POA: Diagnosis not present

## 2018-06-30 NOTE — Progress Notes (Signed)
Noted.  If patient is tolerating the recent start of Norvasc, would continue same dose for now.  Her systolic is better, hopefully trend will continue to improve.

## 2018-06-30 NOTE — Progress Notes (Signed)
Patient in office this morning for vitals check.  Recently stopped Micardis and was started on Amlodipine 5mg  daily.  Took her morning meds around 7:00 am.

## 2018-07-01 NOTE — Progress Notes (Signed)
Busy

## 2018-07-08 NOTE — Progress Notes (Signed)
Patient notified and verbalized understanding.  Stated she was at wound clinic yesterday & BP was 227/90.  I have asked her to monitor her BP readings at home 3-4 x week over next several weeks to see what her trend is.  Explained to patient that you would not want to make any changes off of one isolated reading.  She verbalized understanding & will purchase a monitor.  She will let us know if her numbers continue to trend upward.

## 2018-07-12 ENCOUNTER — Emergency Department (HOSPITAL_COMMUNITY): Payer: Medicare Other

## 2018-07-12 ENCOUNTER — Other Ambulatory Visit: Payer: Self-pay

## 2018-07-12 ENCOUNTER — Encounter (HOSPITAL_COMMUNITY): Payer: Self-pay | Admitting: Emergency Medicine

## 2018-07-12 ENCOUNTER — Inpatient Hospital Stay (HOSPITAL_COMMUNITY)
Admission: EM | Admit: 2018-07-12 | Discharge: 2018-07-15 | DRG: 191 | Disposition: A | Discharge status: Home / Self Care | Payer: Medicare Other | Attending: Internal Medicine | Admitting: Internal Medicine

## 2018-07-12 DIAGNOSIS — Z87891 Personal history of nicotine dependence: Secondary | ICD-10-CM

## 2018-07-12 DIAGNOSIS — J441 Chronic obstructive pulmonary disease with (acute) exacerbation: Secondary | ICD-10-CM | POA: Diagnosis not present

## 2018-07-12 DIAGNOSIS — E66813 Obesity, class 3: Secondary | ICD-10-CM | POA: Diagnosis present

## 2018-07-12 DIAGNOSIS — J449 Chronic obstructive pulmonary disease, unspecified: Secondary | ICD-10-CM | POA: Diagnosis present

## 2018-07-12 DIAGNOSIS — I2510 Atherosclerotic heart disease of native coronary artery without angina pectoris: Secondary | ICD-10-CM | POA: Diagnosis present

## 2018-07-12 DIAGNOSIS — M810 Age-related osteoporosis without current pathological fracture: Secondary | ICD-10-CM | POA: Diagnosis present

## 2018-07-12 DIAGNOSIS — E1121 Type 2 diabetes mellitus with diabetic nephropathy: Secondary | ICD-10-CM | POA: Diagnosis present

## 2018-07-12 DIAGNOSIS — G4733 Obstructive sleep apnea (adult) (pediatric): Secondary | ICD-10-CM | POA: Diagnosis present

## 2018-07-12 DIAGNOSIS — M79606 Pain in leg, unspecified: Secondary | ICD-10-CM

## 2018-07-12 DIAGNOSIS — I10 Essential (primary) hypertension: Secondary | ICD-10-CM

## 2018-07-12 DIAGNOSIS — Z90710 Acquired absence of both cervix and uterus: Secondary | ICD-10-CM

## 2018-07-12 DIAGNOSIS — N183 Chronic kidney disease, stage 3 unspecified: Secondary | ICD-10-CM

## 2018-07-12 DIAGNOSIS — Z79899 Other long term (current) drug therapy: Secondary | ICD-10-CM

## 2018-07-12 DIAGNOSIS — I5032 Chronic diastolic (congestive) heart failure: Secondary | ICD-10-CM

## 2018-07-12 DIAGNOSIS — R0602 Shortness of breath: Secondary | ICD-10-CM

## 2018-07-12 DIAGNOSIS — E875 Hyperkalemia: Secondary | ICD-10-CM | POA: Diagnosis present

## 2018-07-12 DIAGNOSIS — Z794 Long term (current) use of insulin: Secondary | ICD-10-CM

## 2018-07-12 DIAGNOSIS — Z6841 Body Mass Index (BMI) 40.0 and over, adult: Secondary | ICD-10-CM

## 2018-07-12 DIAGNOSIS — Z8543 Personal history of malignant neoplasm of ovary: Secondary | ICD-10-CM

## 2018-07-12 DIAGNOSIS — N179 Acute kidney failure, unspecified: Secondary | ICD-10-CM | POA: Diagnosis not present

## 2018-07-12 DIAGNOSIS — E782 Mixed hyperlipidemia: Secondary | ICD-10-CM | POA: Diagnosis present

## 2018-07-12 DIAGNOSIS — K219 Gastro-esophageal reflux disease without esophagitis: Secondary | ICD-10-CM | POA: Diagnosis present

## 2018-07-12 DIAGNOSIS — G4700 Insomnia, unspecified: Secondary | ICD-10-CM | POA: Diagnosis present

## 2018-07-12 DIAGNOSIS — I130 Hypertensive heart and chronic kidney disease with heart failure and stage 1 through stage 4 chronic kidney disease, or unspecified chronic kidney disease: Secondary | ICD-10-CM | POA: Diagnosis present

## 2018-07-12 DIAGNOSIS — Z7982 Long term (current) use of aspirin: Secondary | ICD-10-CM

## 2018-07-12 DIAGNOSIS — E1140 Type 2 diabetes mellitus with diabetic neuropathy, unspecified: Secondary | ICD-10-CM | POA: Diagnosis present

## 2018-07-12 DIAGNOSIS — E1122 Type 2 diabetes mellitus with diabetic chronic kidney disease: Secondary | ICD-10-CM

## 2018-07-12 DIAGNOSIS — J9611 Chronic respiratory failure with hypoxia: Secondary | ICD-10-CM

## 2018-07-12 DIAGNOSIS — F329 Major depressive disorder, single episode, unspecified: Secondary | ICD-10-CM | POA: Diagnosis present

## 2018-07-12 LAB — CBC WITH DIFFERENTIAL/PLATELET
ABS IMMATURE GRANULOCYTES: 0.05 10*3/uL (ref 0.00–0.07)
BASOS ABS: 0 10*3/uL (ref 0.0–0.1)
BASOS PCT: 1 %
Eosinophils Absolute: 0.3 10*3/uL (ref 0.0–0.5)
Eosinophils Relative: 4 %
HCT: 35.7 % — ABNORMAL LOW (ref 36.0–46.0)
Hemoglobin: 10.9 g/dL — ABNORMAL LOW (ref 12.0–15.0)
IMMATURE GRANULOCYTES: 1 %
LYMPHS PCT: 18 %
Lymphs Abs: 1.3 10*3/uL (ref 0.7–4.0)
MCH: 28.2 pg (ref 26.0–34.0)
MCHC: 30.5 g/dL (ref 30.0–36.0)
MCV: 92.2 fL (ref 80.0–100.0)
Monocytes Absolute: 0.5 10*3/uL (ref 0.1–1.0)
Monocytes Relative: 7 %
NEUTROS ABS: 5.3 10*3/uL (ref 1.7–7.7)
NRBC: 0 % (ref 0.0–0.2)
Neutrophils Relative %: 69 %
PLATELETS: 255 10*3/uL (ref 150–400)
RBC: 3.87 MIL/uL (ref 3.87–5.11)
RDW: 13.6 % (ref 11.5–15.5)
WBC: 7.6 10*3/uL (ref 4.0–10.5)

## 2018-07-12 LAB — BASIC METABOLIC PANEL
ANION GAP: 7 (ref 5–15)
BUN: 22 mg/dL (ref 8–23)
CALCIUM: 8.2 mg/dL — AB (ref 8.9–10.3)
CO2: 23 mmol/L (ref 22–32)
Chloride: 109 mmol/L (ref 98–111)
Creatinine, Ser: 1.37 mg/dL — ABNORMAL HIGH (ref 0.44–1.00)
GFR, EST AFRICAN AMERICAN: 44 mL/min — AB (ref >60)
GFR, EST NON AFRICAN AMERICAN: 38 mL/min — AB (ref >60)
GLUCOSE: 139 mg/dL — AB (ref 70–99)
POTASSIUM: 5.6 mmol/L — AB (ref 3.5–5.1)
Sodium: 139 mmol/L (ref 135–145)

## 2018-07-12 LAB — URINALYSIS, ROUTINE W REFLEX MICROSCOPIC
Bilirubin Urine: NEGATIVE
GLUCOSE, UA: NEGATIVE mg/dL
Hgb urine dipstick: NEGATIVE
KETONES UR: NEGATIVE mg/dL
LEUKOCYTES UA: NEGATIVE
Nitrite: NEGATIVE
PH: 6 (ref 5.0–8.0)
Protein, ur: 100 mg/dL — AB
Specific Gravity, Urine: 1.012 (ref 1.005–1.030)

## 2018-07-12 LAB — INFLUENZA PANEL BY PCR (TYPE A & B)
Influenza A By PCR: NEGATIVE
Influenza B By PCR: NEGATIVE

## 2018-07-12 LAB — TROPONIN I: Troponin I: <0.03 ng/mL (ref <0.03)

## 2018-07-12 LAB — BRAIN NATRIURETIC PEPTIDE: B Natriuretic Peptide: 136 pg/mL — ABNORMAL HIGH (ref 0.0–100.0)

## 2018-07-12 MED ORDER — POLYETHYLENE GLYCOL 3350 17 G PO PACK: 17.0000 g | PACK | Freq: Every day | ORAL | Status: DC | PRN

## 2018-07-12 MED ORDER — ALBUTEROL (5 MG/ML) CONTINUOUS INHALATION SOLN
10.0000 mg/h | INHALATION_SOLUTION | Freq: Once | RESPIRATORY_TRACT | Status: AC
  Administered 2018-07-12: 10 mg/h via RESPIRATORY_TRACT
  Filled 2018-07-12: qty 20

## 2018-07-12 MED ORDER — FUROSEMIDE 40 MG PO TABS
60.0000 mg | ORAL_TABLET | ORAL | Status: DC
  Administered 2018-07-12: 60 mg via ORAL
  Filled 2018-07-12: qty 1

## 2018-07-12 MED ORDER — AMITRIPTYLINE HCL 25 MG PO TABS
100.0000 mg | ORAL_TABLET | Freq: Every day | ORAL | Status: DC
  Administered 2018-07-12 – 2018-07-14 (×3): 100 mg via ORAL
  Filled 2018-07-12 (×3): qty 4

## 2018-07-12 MED ORDER — ASPIRIN EC 81 MG PO TBEC
81.0000 mg | DELAYED_RELEASE_TABLET | Freq: Every day | ORAL | Status: DC
  Administered 2018-07-12 – 2018-07-15 (×4): 81 mg via ORAL
  Filled 2018-07-12 (×4): qty 1

## 2018-07-12 MED ORDER — ACETAMINOPHEN 650 MG RE SUPP: 650.0000 mg | Freq: Four times a day (QID) | RECTAL | Status: DC | PRN

## 2018-07-12 MED ORDER — METHYLPREDNISOLONE SODIUM SUCC 125 MG IJ SOLR
125.0000 mg | Freq: Once | INTRAMUSCULAR | Status: AC
  Administered 2018-07-12: 125 mg via INTRAVENOUS
  Filled 2018-07-12: qty 2

## 2018-07-12 MED ORDER — ACETAMINOPHEN 325 MG PO TABS
650.0000 mg | ORAL_TABLET | Freq: Four times a day (QID) | ORAL | Status: DC | PRN
  Administered 2018-07-12: 650 mg via ORAL
  Filled 2018-07-12: qty 2

## 2018-07-12 MED ORDER — METHYLPREDNISOLONE SODIUM SUCC 125 MG IJ SOLR
60.0000 mg | Freq: Two times a day (BID) | INTRAMUSCULAR | Status: DC
  Administered 2018-07-13 – 2018-07-15 (×5): 60 mg via INTRAVENOUS
  Filled 2018-07-12 (×4): qty 2

## 2018-07-12 MED ORDER — INSULIN ASPART 100 UNIT/ML ~~LOC~~ SOLN
0.0000 [IU] | Freq: Three times a day (TID) | SUBCUTANEOUS | Status: DC
  Administered 2018-07-13 (×2): 5 [IU] via SUBCUTANEOUS
  Administered 2018-07-13: 2 [IU] via SUBCUTANEOUS
  Administered 2018-07-14: 5 [IU] via SUBCUTANEOUS
  Administered 2018-07-14: 2 [IU] via SUBCUTANEOUS
  Administered 2018-07-14 – 2018-07-15 (×3): 5 [IU] via SUBCUTANEOUS

## 2018-07-12 MED ORDER — FUROSEMIDE 80 MG PO TABS
80.0000 mg | ORAL_TABLET | ORAL | Status: DC
  Filled 2018-07-12 (×2): qty 1

## 2018-07-12 MED ORDER — IPRATROPIUM-ALBUTEROL 0.5-2.5 (3) MG/3ML IN SOLN
3.0000 mL | Freq: Four times a day (QID) | RESPIRATORY_TRACT | Status: DC
  Administered 2018-07-12 – 2018-07-15 (×9): 3 mL via RESPIRATORY_TRACT
  Filled 2018-07-12 (×10): qty 3

## 2018-07-12 MED ORDER — ONDANSETRON HCL 4 MG PO TABS: 4.0000 mg | ORAL_TABLET | Freq: Four times a day (QID) | ORAL | Status: DC | PRN

## 2018-07-12 MED ORDER — IPRATROPIUM-ALBUTEROL 0.5-2.5 (3) MG/3ML IN SOLN: 3.0000 mL | Freq: Four times a day (QID) | RESPIRATORY_TRACT | Status: DC | PRN

## 2018-07-12 MED ORDER — ENOXAPARIN SODIUM 40 MG/0.4ML ~~LOC~~ SOLN
40.0000 mg | SUBCUTANEOUS | Status: DC
  Administered 2018-07-12: 40 mg via SUBCUTANEOUS
  Filled 2018-07-12: qty 0.4

## 2018-07-12 MED ORDER — GABAPENTIN 300 MG PO CAPS
300.0000 mg | ORAL_CAPSULE | Freq: Every day | ORAL | Status: DC
  Administered 2018-07-12 – 2018-07-15 (×4): 300 mg via ORAL
  Filled 2018-07-12 (×4): qty 1

## 2018-07-12 MED ORDER — DM-GUAIFENESIN ER 30-600 MG PO TB12
1.0000 | ORAL_TABLET | Freq: Two times a day (BID) | ORAL | Status: DC
  Administered 2018-07-12 – 2018-07-14 (×5): 1 via ORAL
  Filled 2018-07-12 (×5): qty 1

## 2018-07-12 MED ORDER — ALLOPURINOL 100 MG PO TABS
100.0000 mg | ORAL_TABLET | Freq: Two times a day (BID) | ORAL | Status: DC
  Administered 2018-07-12 – 2018-07-15 (×6): 100 mg via ORAL
  Filled 2018-07-12 (×6): qty 1

## 2018-07-12 MED ORDER — IPRATROPIUM BROMIDE 0.02 % IN SOLN
1.0000 mg | Freq: Once | RESPIRATORY_TRACT | Status: AC
  Administered 2018-07-12: 1 mg via RESPIRATORY_TRACT
  Filled 2018-07-12: qty 5

## 2018-07-12 MED ORDER — SODIUM CHLORIDE 0.9 % IV SOLN
500.0000 mg | INTRAVENOUS | Status: DC
  Administered 2018-07-12 – 2018-07-13 (×2): 500 mg via INTRAVENOUS
  Filled 2018-07-12 (×3): qty 500

## 2018-07-12 MED ORDER — AMLODIPINE BESYLATE 5 MG PO TABS
5.0000 mg | ORAL_TABLET | Freq: Every day | ORAL | Status: DC
  Administered 2018-07-12 – 2018-07-15 (×4): 5 mg via ORAL
  Filled 2018-07-12 (×4): qty 1

## 2018-07-12 MED ORDER — SODIUM CHLORIDE 0.9 % IV SOLN
INTRAVENOUS | Status: DC | PRN
  Administered 2018-07-12: 500 mL via INTRAVENOUS

## 2018-07-12 MED ORDER — METOPROLOL SUCCINATE ER 50 MG PO TB24
50.0000 mg | ORAL_TABLET | Freq: Every day | ORAL | Status: DC
  Administered 2018-07-12 – 2018-07-15 (×4): 50 mg via ORAL
  Filled 2018-07-12 (×4): qty 1

## 2018-07-12 MED ORDER — PANTOPRAZOLE SODIUM 40 MG PO TBEC
40.0000 mg | DELAYED_RELEASE_TABLET | Freq: Every day | ORAL | Status: DC
  Administered 2018-07-12 – 2018-07-15 (×4): 40 mg via ORAL
  Filled 2018-07-12 (×4): qty 1

## 2018-07-12 MED ORDER — PNEUMOCOCCAL VAC POLYVALENT 25 MCG/0.5ML IJ INJ
0.5000 mL | INJECTION | INTRAMUSCULAR | Status: DC
  Filled 2018-07-12: qty 0.5

## 2018-07-12 MED ORDER — ONDANSETRON HCL 4 MG/2ML IJ SOLN: 4.0000 mg | Freq: Four times a day (QID) | INTRAMUSCULAR | Status: DC | PRN

## 2018-07-12 MED ORDER — ATORVASTATIN CALCIUM 40 MG PO TABS
40.0000 mg | ORAL_TABLET | Freq: Every day | ORAL | Status: DC
  Administered 2018-07-12 – 2018-07-14 (×3): 40 mg via ORAL
  Filled 2018-07-12 (×3): qty 1

## 2018-07-12 MED ORDER — LORAZEPAM 2 MG/ML IJ SOLN
0.5000 mg | Freq: Once | INTRAMUSCULAR | Status: AC
  Administered 2018-07-12: 0.5 mg via INTRAVENOUS
  Filled 2018-07-12: qty 1

## 2018-07-12 MED ORDER — INSULIN GLARGINE 100 UNIT/ML ~~LOC~~ SOLN
15.0000 [IU] | Freq: Every day | SUBCUTANEOUS | Status: DC
  Administered 2018-07-13: 15 [IU] via SUBCUTANEOUS
  Filled 2018-07-12 (×3): qty 0.15

## 2018-07-12 NOTE — ED Provider Notes (Signed)
[facility] EMERGENCY DEPARTMENT Provider Note   CSN: [id number] Arrival date & time: 07/12/18  1319     History   Chief Complaint Chief Complaint  Patient presents with  . Shortness of Breath    HPI [name] is a 72 y.o. female.  HPI  Pt was seen at 1325.  Per pt, c/o gradual onset and worsening of persistent cough, wheezing and SOB for the past 2 weeks, worse over the past 2 days. Endorses hx of COPD and CHF. Denies increasing pedal edema from baseline or weight gain at home (weighs self "a few times a week").  Has been using home O2 continuously (instead of prescribed as needed) due to her symptoms. Has been using MDI and nebs without relief.  Denies CP/palpitations, no back pain, no abd pain, no N/V/D, no fevers, no rash.    Past Medical History:  Diagnosis Date  . Blood transfusion without reported diagnosis   . CAD (coronary artery disease)    Nonobstructive at cardiac catheterization 2000  . Cataract   . CHF (congestive heart failure) ([place])   . Chronic back pain   . Chronic kidney disease   . COPD (chronic obstructive pulmonary disease) ([place])   . Degenerative disc disease   . Essential hypertension, benign   . GERD (gastroesophageal reflux disease)   . Gout   . Hiatal hernia 07/27/2013  . History of diverticulitis of colon   . History of hiatal hernia   . Iron deficiency anemia   . Irritable bowel syndrome   . Lumbar radiculopathy   . Mixed hyperlipidemia   . Neuropathy   . Osteoporosis   . Ovarian cancer ([place]) 04/02/2016  . Oxygen deficiency   . Sleep apnea   . Type 2 diabetes mellitus ([place])   . Vitamin B deficiency 12/25/2009  . Vitamin B12 deficiency     Patient Active Problem List   Diagnosis Date Noted  . Flatulence 09/18/2017  . IBS (irritable bowel syndrome) 04/03/2017  . Gout 03/15/2017  . Acute on chronic diastolic CHF (congestive heart failure) ([place]) 05/08/2016  . CKD (chronic kidney disease) stage 3, GFR 30-59 ml/min (HCC)  05/08/2016  . Constipation due to slow transit 04/25/2016  . Peripheral vertigo 06/27/2015  . Port-A-Cath in place 10/06/2014  . Chronic obstructive pulmonary disease ([place]) 04/30/2014  . Morbid (severe) obesity due to excess calories ([place]) 04/30/2014  . Hemorrhoids, internal, with bleeding 11/10/2013  . Anemia 08/18/2013  . Acid reflux   . Obstructive chronic bronchitis without exacerbation ([place]) 12/26/2009  . DM type 2 with diabetic peripheral neuropathy ([place]) 12/25/2009  . Vitamin B deficiency 12/25/2009  . Iron deficiency anemia due to chronic blood loss 12/25/2009  . Essential hypertension, benign 12/25/2009  . DEGENERATIVE DISC DISEASE 12/25/2009  . OSA on CPAP 12/25/2009  . HLD (hyperlipidemia) 12/25/2009  . Controlled type 2 diabetes mellitus with diabetic polyneuropathy, with long-term current use of insulin ([place]) 12/25/2009    Past Surgical History:  Procedure Laterality Date  . ABDOMINAL HYSTERECTOMY    . BACK SURGERY    . Benign breast cysts    . CHOLECYSTECTOMY    . COLONOSCOPY  10/01/2006   SLF:Pan colonic diverticulosis and moderate internal hemorrhoids/ Otherwise no polyps, masses, inflammatory changes or AVMs/  . COLONOSCOPY  2011   SLF: pancolonic diverticulosis, large internal hemorrhoids  . COLONOSCOPY N/A 01/26/2016   Procedure: COLONOSCOPY;  Surgeon: [name], MD;  Location: [facility] SUITE;  Service: Endoscopy;  Laterality: N/A;  830   .  ESOPHAGOGASTRODUODENOSCOPY  11/19/2006   SLF: Large hiatal hernia without evidence of Cameron ulcers/. Distal esophageal stricture, which allowed the gastroscope to pass without resistance.  A 16 mm Savary later passed with mild resistance/ Normal stomach.sb bx negative  . ESOPHAGOGASTRODUODENOSCOPY  10/01/2006   PTW:SFKCL hiatal hernia.  Distal esophagus without evidence of   erythema, ulceration or Barrett's esophagus  . ESOPHAGOGASTRODUODENOSCOPY  2011   SLF: large hh, distal esophageal web narrowing to 42mm s/p  dilation to 36mm  . ESOPHAGOGASTRODUODENOSCOPY N/A 08/06/2013   SLF: 1. Stricture at the gastroesophageal junction 2. large hiatal hernia. 3. Mild erosive gastritis.  Marland Kitchen GIVENS CAPSULE STUDY N/A 08/06/2013   INCOMPLETE-SMALL BOWLE ULCERS  . KNEE SURGERY Right   . PARTIAL HYSTERECTOMY  1978  . small bowel capsule  2008   negative  . SPINE SURGERY    . TONSILLECTOMY AND ADENOIDECTOMY    . Two back surgeries/fusion    . UMBILICAL HERNIA REPAIR  2010     OB History   None      Home Medications    Prior to Admission medications   Medication Sig Start Date End Date Taking? Authorizing Provider  allopurinol (ZYLOPRIM) 100 MG tablet Take 1 tablet by mouth 2 (two) times daily. 10/09/12  Yes [provider]  amitriptyline (ELAVIL) 100 MG tablet Take 100 mg by mouth at bedtime.     Yes [provider]  amLODipine (NORVASC) 5 MG tablet Take 1 tablet (5 mg total) by mouth daily. 06/18/18 09/16/18 Yes Satira Sark, MD  aspirin EC 81 MG tablet Take 81 mg by mouth daily.   Yes [provider]  atorvastatin (LIPITOR) 40 MG tablet Take 1 tablet (40 mg total) by mouth at bedtime. 05/04/18  Yes Satira Sark, MD  BIOTIN PO Take 1 capsule by mouth daily.    Yes [provider]  esomeprazole (NEXIUM) 40 MG capsule TAKE 1 CAPSULE TWICE A DAY 30 MINUTES PRIOR TO MEALS Patient taking differently: Take 40 mg by mouth 2 (two) times daily as needed.  01/19/18  Yes Annitta Needs, NP  famotidine (PEPCID) 40 MG tablet TAKE 1 TABLET AT BEDTIME AS NEEDED TO CONTROL REFLUX Patient taking differently: Take 40 mg by mouth at bedtime as needed for heartburn or indigestion.  06/23/15  Yes Mannam, Praveen, MD  furosemide (LASIX) 80 MG tablet Take 1 tablet (80 mg total) by mouth daily. Take 80 mg alternating with 60 mg every other day Patient taking differently: Take 60-80 mg by mouth daily. Alternates 60 mg one days and 80 mg the next. 07/16/17 05/05/19 Yes Satira Sark, MD    gabapentin (NEURONTIN) 300 MG capsule Take 1 capsule by mouth daily.  08/28/17  Yes [provider]  insulin lispro (HUMALOG) 100 UNIT/ML KiwkPen Inject 12-18 Units into the skin See admin instructions. Uses sliding scale and injects with meals.  Least she injects is 12 units. Most is 18 units. 03/12/16  Yes [provider]  LANTUS SOLOSTAR 100 UNIT/ML SOPN Inject 20 Units into the skin at bedtime.  10/06/12  Yes [provider]  loperamide (IMODIUM) 2 MG capsule Take 2 mg by mouth as needed for diarrhea or loose stools.   Yes [provider]  meclizine (ANTIVERT) 25 MG tablet Take 1 tablet (25 mg total) by mouth 3 (three) times daily as needed for dizziness. 07/21/17  Yes [name], MD  metoprolol succinate (TOPROL-XL) 50 MG 24 hr tablet Take 1 tablet (50 mg total)  by mouth daily. Take with or immediately following a meal. 05/04/18  Yes Satira Sark, MD  Misc. Devices MISC 3cc , 25G x 1" [employer] Syringe for Vitamin B12 injections monthly Patient not taking: Reported on 07/12/2018 07/05/16   Baird Cancer, PA-C    Family History Family History  Problem Relation Age of Onset  . Colon cancer Brother        diagnosed age 4. Living.   Marland Kitchen Ulcers Sister   . Diabetes Sister   . Heart attack Sister   . Kidney failure Sister   . Stroke Sister   . Ulcers Mother   . Diabetes Mother   . Heart attack Mother   . Stroke Mother   . Asthma Mother   . Heart disease Mother   . Heart attack Brother   . Heart disease Brother   . Asthma Sister   . Diabetes Brother   . Stroke Maternal Grandmother   . Heart attack Maternal Grandmother   . Heart attack Other   . Early death Father        MVA in his 79s    Social History Social History   Tobacco Use  . Smoking status: Former Smoker    Packs/day: 1.00    Years: 1.00    Pack years: 1.00    Types: Cigarettes    Start date: 02/19/1961    Last attempt to quit: 08/05/1961    Years since quitting: 56.9   . Smokeless tobacco: Never Used  . Tobacco comment: Quit smoking x 50 years  Substance Use Topics  . Alcohol use: No  . Drug use: No     Allergies   Patient has no known allergies.   Review of Systems Review of Systems ROS: Statement: All systems negative except as marked or noted in the HPI; Constitutional: Negative for fever and chills. ; ; Eyes: Negative for eye pain, redness and discharge. ; ; ENMT: Negative for ear pain, hoarseness, nasal congestion, sinus pressure and sore throat. ; ; Cardiovascular: Negative for chest pain, palpitations, diaphoresis, and peripheral edema. ; ; Respiratory: +cough, wheezing, SOB. Negative for stridor. ; ; Gastrointestinal: Negative for nausea, vomiting, diarrhea, abdominal pain, blood in stool, hematemesis, jaundice and rectal bleeding. . ; ; Genitourinary: Negative for dysuria, flank pain and hematuria. ; ; Musculoskeletal: Negative for back pain and neck pain. Negative for swelling and trauma.; ; Skin: Negative for pruritus, rash, abrasions, blisters, bruising and skin lesion.; ; Neuro: Negative for headache, lightheadedness and neck stiffness. Negative for weakness, altered level of consciousness, altered mental status, extremity weakness, paresthesias, involuntary movement, seizure and syncope.       Physical Exam Updated Vital Signs BP (!) 179/103 (BP Location: Left Arm)   Pulse 95   Temp 97.8 F (36.6 C) (Oral)   Resp (!) 21   SpO2 98%   Physical Exam 1330: Physical examination:  Nursing notes reviewed; Vital signs and O2 SAT reviewed;  Constitutional: Well developed, Well nourished, Well hydrated, Uncomfortable appearing.;; Head:  Normocephalic, atraumatic; Eyes: EOMI, PERRL, No scleral icterus; ENMT: Mouth and pharynx normal, Mucous membranes moist; Neck: Supple, Full range of motion, No lymphadenopathy; Cardiovascular: Tachycardic rate and rhythm, No gallop; Respiratory: Breath sounds coarse & equal bilaterally, insp/exp wheezes bilat.  Faint audible wheezing. Moist cough during exam.  Speaking short sentences, sitting upright, tachypneic.; Chest: Nontender, Movement normal; Abdomen: Soft, Nontender, Nondistended, Normal bowel sounds; Genitourinary: No CVA tenderness; Extremities: Pulses normal, No tenderness, +2 pedal edema bilat. No calf edema  or asymmetry.; Neuro: AA&Ox3, Major CN grossly intact.  Speech clear. No gross focal motor or sensory deficits in extremities.; Skin: Color normal, Warm, Dry.     ED Treatments / Results  Labs (all labs ordered are listed, but only abnormal results are displayed)   EKG EKG Interpretation  Date/Time:  Sunday July 12 2018 13:29:42 EST Ventricular Rate:  101 PR Interval:    QRS Duration: 98 QT Interval:  313 QTC Calculation: 394 R Axis:   27 Text Interpretation:  Sinus tachycardia Atrial premature complexes in couplets Borderline repolarization abnormality Baseline wander Artifact When compared with ECG of 05/08/2016 Nonspecific T wave abnormality is more pronounced Confirmed by Francine Graven [phone]) on 07/12/2018 2:04:39 PM   Radiology   Procedures Procedures (including critical care time)  Medications Ordered in ED Medications  methylPREDNISolone sodium succinate (SOLU-MEDROL) 125 mg/2 mL injection 125 mg (has no administration in time range)  albuterol (PROVENTIL,VENTOLIN) solution continuous neb (10 mg/hr Nebulization Given 07/12/18 1340)  ipratropium (ATROVENT) nebulizer solution 1 mg (1 mg Nebulization Given 07/12/18 1340)     Initial Impression / Assessment and Plan / ED Course  I have reviewed the triage vital signs and the nursing notes.  Pertinent labs & imaging results that were available during my care of the patient were reviewed by me and considered in my medical decision making (see chart for details).  MDM Reviewed: previous chart, nursing note and vitals Reviewed previous: labs and ECG Interpretation: labs, ECG and x-ray Total time providing  critical care: 30-74 minutes. This excludes time spent performing separately reportable procedures and services. Consults: admitting MD    CRITICAL CARE Performed by: Francine Graven Total critical care time: 45 minutes Critical care time was exclusive of separately billable procedures and treating other patients. Critical care was necessary to treat or prevent imminent or life-threatening deterioration. Critical care was time spent personally by me on the following activities: development of treatment plan with patient and/or surrogate as well as nursing, discussions with consultants, evaluation of patient's response to treatment, examination of patient, obtaining history from patient or surrogate, ordering and performing treatments and interventions, ordering and review of laboratory studies, ordering and review of radiographic studies, pulse oximetry and re-evaluation of patient's condition.   Results for orders placed or performed during the hospital encounter of 82/99/37  Basic metabolic panel  Result Value Ref Range   Sodium 139 135 - 145 mmol/L   Potassium 5.6 (H) 3.5 - 5.1 mmol/L   Chloride 109 98 - 111 mmol/L   CO2 23 22 - 32 mmol/L   Glucose, Bld 139 (H) 70 - 99 mg/dL   BUN 22 8 - 23 mg/dL   Creatinine, Ser 1.37 (H) 0.44 - 1.00 mg/dL   Calcium 8.2 (L) 8.9 - 10.3 mg/dL   GFR calc non Af Amer 38 (L) >60 mL/min   GFR calc Af Amer 44 (L) >60 mL/min   Anion gap 7 5 - 15  Brain natriuretic peptide  Result Value Ref Range   B Natriuretic Peptide 136.0 (H) 0.0 - 100.0 pg/mL  Troponin I - Once  Result Value Ref Range   Troponin I <0.03 <0.03 ng/mL  CBC with Differential  Result Value Ref Range   WBC 7.6 4.0 - 10.5 K/uL   RBC 3.87 3.87 - 5.11 MIL/uL   Hemoglobin 10.9 (L) 12.0 - 15.0 g/dL   HCT 35.7 (L) 36.0 - 46.0 %   MCV 92.2 80.0 - 100.0 fL   MCH 28.2 26.0 - 34.0  pg   MCHC 30.5 30.0 - 36.0 g/dL   RDW 13.6 11.5 - 15.5 %   Platelets 255 150 - 400 K/uL   nRBC 0.0 0.0 - 0.2  %   Neutrophils Relative % 69 %   Neutro Abs 5.3 1.7 - 7.7 K/uL   Lymphocytes Relative 18 %   Lymphs Abs 1.3 0.7 - 4.0 K/uL   Monocytes Relative 7 %   Monocytes Absolute 0.5 0.1 - 1.0 K/uL   Eosinophils Relative 4 %   Eosinophils Absolute 0.3 0.0 - 0.5 K/uL   Basophils Relative 1 %   Basophils Absolute 0.0 0.0 - 0.1 K/uL   Immature Granulocytes 1 %   Abs Immature Granulocytes 0.05 0.00 - 0.07 K/uL   Dg Chest Port 1 View Result Date: 07/12/2018 CLINICAL DATA:  Cough and shortness of breath. EXAM: PORTABLE CHEST 1 VIEW COMPARISON:  05/08/2016 FINDINGS: Stable cardiac enlargement and tortuous ectatic thoracic aorta. The right IJ Port-A-Cath is stable. The lungs are grossly clear. No obvious infiltrates, edema or effusions. IMPRESSION: Cardiac enlargement but no acute pulmonary findings. Electronically Signed   By: Marijo Sanes M.D.   On: 07/12/2018 14:00    1520:  On arrival: pt sitting upright, tachypneic, tachycardic, Sats 98 % R/A, lungs diminished. IV solumedrol and hour long neb started. After neb: pt appears more comfortable at rest, less tachypneic, Sats 96 % on R/A, lungs continue coarse with wheezing (occasional audible wheezing) and coughing. Pt ambulated in hallway: pt's O2 Sats dropped to 92-94 % R/A with pt c/o increasing SOB and wheezing, became "red in the face," and had increasing RR to 29 and HR into 140's. Pt escorted back to stretcher. H/H and BUN/Cr per baseline. Mild BNP elevation but no acute CHF on CXR. Dx and testing d/w pt and family.  Questions answered.  Verb understanding, agreeable to admit.   1535:  T/C returned from [facility] [name], case discussed, including:  HPI, pertinent PM/SHx, VS/PE, dx testing, ED course and treatment:  Agreeable to admit.       Final Clinical Impressions(s) / ED Diagnoses   Final diagnoses:  None    ED Discharge Orders    None       Francine Graven, DO 07/17/18 1339

## 2018-07-12 NOTE — ED Triage Notes (Signed)
Patient c/o shortness of breath. Per patient short of breath x2 weeks but getting progressively worse in past 2 days. Patient states hx of COPD and CHF. Patient has swelling in legs bilaterally but states swelling "is actually better today." Denies gaining any weight. Patient reports using nebs and inhalers with no improvement. Patient wears O2 as needed at home-using 2 liters continuously in past 2 days. Denies any chest pain.

## 2018-07-12 NOTE — H&P (Signed)
History and Physical    [name] XLK:[id number] DOB: [birth year] DOA: 07/12/2018  PCP: [name], MD   Patient coming from: Home  I have personally briefly reviewed patient's old medical records in [place]  Chief Complaint: SOB, Cough  HPI: [name] is a 72 y.o. female with medical history significant for COPD, Diastolic CHF, HTN, DM, CKD3, who presented to the ED with complaints of 2 weeks of increasing shortness of breath and cough.  Cough is mostly dry but she feels she cannot cough stuff up.  She denies chest pain.  No fevers.  No sick contacts.  No rhinorrhea or myalgia.  Never been intubated.  Quit smoking ~66yrs ago, but worked in [employer].  O2 by nasal cannula at night with CPAP, and as needed during the day. Reports compliance with Lasix daily.  Lower extremity swelling, improved compared to prior.  ED Course: Elevated blood pressure, O2 sats greater than 94% on room air.  Mild potassium elevation to 5.6.  BNP 136.  Two-view chest x-ray negative for acute abnormality.  Old T wave inversions, unchanged from prior.  Patient given 125 Solu-Medrol duo nebs.  On attempt at ambulation patient became significantly short of breath, coughing, wheezing, heart rate increased to 140s, O2 sats remained above 94%, hence hospitalist called for admission for COPD exacerbation.  Review of Systems: As per HPI all other systems reviewed and negative  Past Medical History:  Diagnosis Date  . Blood transfusion without reported diagnosis   . CAD (coronary artery disease)    Nonobstructive at cardiac catheterization 2000  . Cataract   . CHF (congestive heart failure) ([place])   . Chronic back pain   . Chronic kidney disease   . COPD (chronic obstructive pulmonary disease) ([place])   . Degenerative disc disease   . Essential hypertension, benign   . GERD (gastroesophageal reflux disease)   . Gout   . Hiatal hernia 07/27/2013  . History of diverticulitis of colon    . History of hiatal hernia   . Iron deficiency anemia   . Irritable bowel syndrome   . Lumbar radiculopathy   . Mixed hyperlipidemia   . Neuropathy   . Osteoporosis   . Ovarian cancer ([place]) 04/02/2016  . Oxygen deficiency   . Sleep apnea   . Type 2 diabetes mellitus ([place])   . Vitamin B deficiency 12/25/2009  . Vitamin B12 deficiency     Past Surgical History:  Procedure Laterality Date  . ABDOMINAL HYSTERECTOMY    . BACK SURGERY    . Benign breast cysts    . CHOLECYSTECTOMY    . COLONOSCOPY  10/01/2006   SLF:Pan colonic diverticulosis and moderate internal hemorrhoids/ Otherwise no polyps, masses, inflammatory changes or AVMs/  . COLONOSCOPY  2011   SLF: pancolonic diverticulosis, large internal hemorrhoids  . COLONOSCOPY N/A 01/26/2016   Procedure: COLONOSCOPY;  Surgeon: [name], MD;  Location: [facility];  Service: Endoscopy;  Laterality: N/A;  830   . ESOPHAGOGASTRODUODENOSCOPY  11/19/2006   SLF: Large hiatal hernia without evidence of Cameron ulcers/. Distal esophageal stricture, which allowed the gastroscope to pass without resistance.  A 16 mm Savary later passed with mild resistance/ Normal stomach.sb bx negative  . ESOPHAGOGASTRODUODENOSCOPY  10/01/2006   DGU:YQIHK hiatal hernia.  Distal esophagus without evidence of   erythema, ulceration or Barrett's esophagus  . ESOPHAGOGASTRODUODENOSCOPY  2011   SLF: large hh, distal esophageal web narrowing to 65mm s/p dilation to 19mm  .  ESOPHAGOGASTRODUODENOSCOPY N/A 08/06/2013   SLF: 1. Stricture at the gastroesophageal junction 2. large hiatal hernia. 3. Mild erosive gastritis.  Marland Kitchen GIVENS CAPSULE STUDY N/A 08/06/2013   INCOMPLETE-SMALL BOWLE ULCERS  . KNEE SURGERY Right   . PARTIAL HYSTERECTOMY  1978  . small bowel capsule  2008   negative  . SPINE SURGERY    . TONSILLECTOMY AND ADENOIDECTOMY    . Two back surgeries/fusion    . UMBILICAL HERNIA REPAIR  2010     reports that she quit smoking about 56 years ago. Her  smoking use included cigarettes. She started smoking about 57 years ago. She has a 1.00 pack-year smoking history. She has never used smokeless tobacco. She reports that she does not drink alcohol or use drugs.  No Known Allergies  Family History  Problem Relation Age of Onset  . Colon cancer Brother        diagnosed age 13. Living.   Marland Kitchen Ulcers Sister   . Diabetes Sister   . Heart attack Sister   . Kidney failure Sister   . Stroke Sister   . Ulcers Mother   . Diabetes Mother   . Heart attack Mother   . Stroke Mother   . Asthma Mother   . Heart disease Mother   . Heart attack Brother   . Heart disease Brother   . Asthma Sister   . Diabetes Brother   . Stroke Maternal Grandmother   . Heart attack Maternal Grandmother   . Heart attack Other   . Early death Father        MVA in his 6s    Prior to Admission medications   Medication Sig Start Date End Date Taking? Authorizing Provider  allopurinol (ZYLOPRIM) 100 MG tablet Take 1 tablet by mouth 2 (two) times daily. 10/09/12  Yes [provider]  amitriptyline (ELAVIL) 100 MG tablet Take 100 mg by mouth at bedtime.     Yes [provider]  amLODipine (NORVASC) 5 MG tablet Take 1 tablet (5 mg total) by mouth daily. 06/18/18 09/16/18 Yes Satira Sark, MD  aspirin EC 81 MG tablet Take 81 mg by mouth daily.   Yes [provider]  atorvastatin (LIPITOR) 40 MG tablet Take 1 tablet (40 mg total) by mouth at bedtime. 05/04/18  Yes Satira Sark, MD  BIOTIN PO Take 1 capsule by mouth daily.    Yes [provider]  esomeprazole (NEXIUM) 40 MG capsule TAKE 1 CAPSULE TWICE A DAY 30 MINUTES PRIOR TO MEALS Patient taking differently: Take 40 mg by mouth 2 (two) times daily as needed.  01/19/18  Yes Annitta Needs, NP  famotidine (PEPCID) 40 MG tablet TAKE 1 TABLET AT BEDTIME AS NEEDED TO CONTROL REFLUX Patient taking differently: Take 40 mg by mouth at bedtime as needed for heartburn or indigestion.   06/23/15  Yes Mannam, Praveen, MD  furosemide (LASIX) 80 MG tablet Take 1 tablet (80 mg total) by mouth daily. Take 80 mg alternating with 60 mg every other day Patient taking differently: Take 60-80 mg by mouth daily. Alternates 60 mg one days and 80 mg the next. 07/16/17 05/05/19 Yes Satira Sark, MD  gabapentin (NEURONTIN) 300 MG capsule Take 1 capsule by mouth daily.  08/28/17  Yes [provider]  insulin lispro (HUMALOG) 100 UNIT/ML KiwkPen Inject 12-18 Units into the skin See admin instructions. Uses sliding scale and injects with meals.  Least she injects is 12 units. Most is 18 units. 03/12/16  Yes [provider]  LANTUS SOLOSTAR 100 UNIT/ML SOPN Inject 20 Units into the skin at bedtime.  10/06/12  Yes [provider]  loperamide (IMODIUM) 2 MG capsule Take 2 mg by mouth as needed for diarrhea or loose stools.   Yes [provider]  meclizine (ANTIVERT) 25 MG tablet Take 1 tablet (25 mg total) by mouth 3 (three) times daily as needed for dizziness. 07/21/17  Yes [name], MD  metoprolol succinate (TOPROL-XL) 50 MG 24 hr tablet Take 1 tablet (50 mg total) by mouth daily. Take with or immediately following a meal. 05/04/18  Yes Satira Sark, MD  Misc. Devices MISC 3cc , 25G x 1" [employer] Syringe for Vitamin B12 injections monthly Patient not taking: Reported on 07/12/2018 07/05/16   Baird Cancer, PA-C    Physical Exam: Vitals:   07/12/18 1331 07/12/18 1340 07/12/18 1400 07/12/18 1430  BP: (!) 179/103  (!) 180/79 (!) 156/49  Pulse: 95  84 (!) 101  Resp: (!) 21  (!) 26 (!) 29  Temp: 97.8 F (36.6 C)     TempSrc: Oral     SpO2: 98% 98% 100% 100%    Constitutional: Mildly dyspneic, mildly agitated Vitals:   07/12/18 1331 07/12/18 1340 07/12/18 1400 07/12/18 1430  BP: (!) 179/103  (!) 180/79 (!) 156/49  Pulse: 95  84 (!) 101  Resp: (!) 21  (!) 26 (!) 29  Temp: 97.8 F (36.6 C)     TempSrc: Oral     SpO2: 98% 98% 100% 100%     Eyes: PERRL, lids and conjunctivae normal ENMT: Mucous membranes are moist. Posterior pharynx clear of any exudate or lesions.  Neck: normal, supple, no masses, no thyromegaly Respiratory: Diffuse rhonchi, upper airway transmitted sounds from secretions, mild increased work of breathing.   Cardiovascular: Tachycardic in the 120s, but regular rate and rhythm, no murmurs / rubs / gallops.  Lower extremities appear swollen but without pitting edema, chronic stasis changes anterior shins, lower extremities warm and well perfused.  Abdomen: no tenderness, no masses palpated. No hepatosplenomegaly. Bowel sounds positive.  Musculoskeletal: no clubbing / cyanosis. No joint deformity upper and lower extremities. Good ROM, no contractures. Normal muscle tone.  Skin: no rashes, lesions, ulcers. No induration Neurologic: CN 2-12 grossly intact.  Strength 5/5 in all 4.  Psychiatric: Normal judgment and insight. Alert and oriented x 3. Normal mood.   Labs on Admission: I have personally reviewed following labs and imaging studies  CBC: Recent Labs  Lab 07/12/18 1335  WBC 7.6  NEUTROABS 5.3  HGB 10.9*  HCT 35.7*  MCV 92.2  PLT 956   Basic Metabolic Panel: Recent Labs  Lab 07/12/18 1335  NA 139  K 5.6*  CL 109  CO2 23  GLUCOSE 139*  BUN 22  CREATININE 1.37*  CALCIUM 8.2*   Cardiac Enzymes: Recent Labs  Lab 07/12/18 1335  TROPONINI <0.03   Urine analysis:    Component Value Date/Time   COLORURINE YELLOW 07/12/2018 1535   APPEARANCEUR HAZY (A) 07/12/2018 1535   LABSPEC 1.012 07/12/2018 1535   PHURINE 6.0 07/12/2018 1535   GLUCOSEU NEGATIVE 07/12/2018 1535   HGBUR NEGATIVE 07/12/2018 1535   BILIRUBINUR NEGATIVE 07/12/2018 1535   KETONESUR NEGATIVE 07/12/2018 1535   PROTEINUR 100 (A) 07/12/2018 1535   UROBILINOGEN 0.2 01/29/2007 1500   NITRITE NEGATIVE 07/12/2018 1535   LEUKOCYTESUR NEGATIVE 07/12/2018 1535    Radiological Exams on Admission: Dg Chest Port 1  View  Result  Date: 07/12/2018 CLINICAL DATA:  Cough and shortness of breath. EXAM: PORTABLE CHEST 1 VIEW COMPARISON:  05/08/2016 FINDINGS: Stable cardiac enlargement and tortuous ectatic thoracic aorta. The right IJ Port-A-Cath is stable. The lungs are grossly clear. No obvious infiltrates, edema or effusions. IMPRESSION: Cardiac enlargement but no acute pulmonary findings. Electronically Signed   By: Marijo Sanes M.D.   On: 07/12/2018 14:00    EKG: Independently reviewed. T wave inversions aVL aVF for V5, QTC 395, premature atrial complexes  Assessment/Plan Active Problems:   COPD exacerbation (HCC)   COPD exacerbation-coughing, wheezing, SOB.  Chest x-ray no acute abnormalities.  BNP 136, elevated compared to prior but Weight is unchanged, compliant with Lasix, does not appear volume overloaded at this time. EKG unchanged. Trop x 1- <0.03.  Tachycardia and mild agitation likely from hour-long albuterol treatment. -Continue IV Solu-Medrol at reduced dose 60 twice daily IV -Azithromycin IV 500 daily -DuoNeb scheduled PRN - Check Influenza - Ativan 0.1mg  IV x 1 -Scheduled mucolytics, Supplimental O2 -CPAP at night  Diastolic CHF- last echo 5038 EF 60 to 65%, G2DD.  Appears stable.  Also BMP increased to 136 compared to prior patient does not appear volume overloaded.  Follows with Dr. Conni Elliot -Continue Lasix 60 to 80 mg daily -Continue Coreg, aspirin, Statins  Hyperkalemia- K- 5.6.,  Likely from CKD.  Micardis recently discontinued by cardiologist, ~3 weeks ago.  Patient denies taking medication.  Not on K supplements. -Monitor  DM- Glucose 139. Hgba1c- 10/2017- 6.9. - Cont Home latus at reduced dose 15u daily - SSI- M - Monitor glucose while on steroids  HTN-elevated 150s to 180s. -Resume home Norvasc, Toprol, Lasix.  CKD- Cr improved at 1.3, baseline ~1.6.  - BMP a.m   DVT prophylaxis: Lovenox Code Status: Full Family Communication: Son and daughter in law at  bedside Disposition Plan: 1-2 days Consults called: None Admission status: Obs, tele   Bethena Roys MD [facility] Pager [phone] From 3PM-11PM.  Otherwise please contact night-coverage [email or web address] Password [facility]  07/12/2018, 3:58 PM

## 2018-07-12 NOTE — ED Notes (Addendum)
Pt ambulated to restroom with help of 2 person assist. Pt's heart rate increased from 119bpm to 140bpm and O2 sats dropped from 96% to 94% on RA while ambulating. Pt became very SOB upon arriving at the bathroom. Pt became red in the face, slightly diaphoretic and had a barking cough. Pt was unable to complete sentences when she arrived to the bathroom due to the dyspnea. Pt transferred back to ED room via wheelchair. EDP notified.

## 2018-07-13 DIAGNOSIS — J9611 Chronic respiratory failure with hypoxia: Secondary | ICD-10-CM | POA: Diagnosis present

## 2018-07-13 DIAGNOSIS — N183 Chronic kidney disease, stage 3 unspecified: Secondary | ICD-10-CM

## 2018-07-13 DIAGNOSIS — Z794 Long term (current) use of insulin: Secondary | ICD-10-CM | POA: Diagnosis not present

## 2018-07-13 DIAGNOSIS — G4733 Obstructive sleep apnea (adult) (pediatric): Secondary | ICD-10-CM | POA: Diagnosis present

## 2018-07-13 DIAGNOSIS — I2510 Atherosclerotic heart disease of native coronary artery without angina pectoris: Secondary | ICD-10-CM | POA: Diagnosis present

## 2018-07-13 DIAGNOSIS — Z7982 Long term (current) use of aspirin: Secondary | ICD-10-CM | POA: Diagnosis not present

## 2018-07-13 DIAGNOSIS — I5032 Chronic diastolic (congestive) heart failure: Secondary | ICD-10-CM

## 2018-07-13 DIAGNOSIS — K219 Gastro-esophageal reflux disease without esophagitis: Secondary | ICD-10-CM | POA: Diagnosis present

## 2018-07-13 DIAGNOSIS — J449 Chronic obstructive pulmonary disease, unspecified: Secondary | ICD-10-CM | POA: Diagnosis present

## 2018-07-13 DIAGNOSIS — E1122 Type 2 diabetes mellitus with diabetic chronic kidney disease: Secondary | ICD-10-CM | POA: Diagnosis present

## 2018-07-13 DIAGNOSIS — R0602 Shortness of breath: Secondary | ICD-10-CM | POA: Diagnosis present

## 2018-07-13 DIAGNOSIS — Z90710 Acquired absence of both cervix and uterus: Secondary | ICD-10-CM | POA: Diagnosis not present

## 2018-07-13 DIAGNOSIS — Z79899 Other long term (current) drug therapy: Secondary | ICD-10-CM | POA: Diagnosis not present

## 2018-07-13 DIAGNOSIS — E782 Mixed hyperlipidemia: Secondary | ICD-10-CM | POA: Diagnosis present

## 2018-07-13 DIAGNOSIS — Z87891 Personal history of nicotine dependence: Secondary | ICD-10-CM | POA: Diagnosis not present

## 2018-07-13 DIAGNOSIS — I130 Hypertensive heart and chronic kidney disease with heart failure and stage 1 through stage 4 chronic kidney disease, or unspecified chronic kidney disease: Secondary | ICD-10-CM | POA: Diagnosis present

## 2018-07-13 DIAGNOSIS — E875 Hyperkalemia: Secondary | ICD-10-CM | POA: Diagnosis present

## 2018-07-13 DIAGNOSIS — N179 Acute kidney failure, unspecified: Secondary | ICD-10-CM | POA: Diagnosis not present

## 2018-07-13 DIAGNOSIS — M810 Age-related osteoporosis without current pathological fracture: Secondary | ICD-10-CM | POA: Diagnosis present

## 2018-07-13 DIAGNOSIS — J441 Chronic obstructive pulmonary disease with (acute) exacerbation: Secondary | ICD-10-CM | POA: Diagnosis present

## 2018-07-13 DIAGNOSIS — Z8543 Personal history of malignant neoplasm of ovary: Secondary | ICD-10-CM | POA: Diagnosis not present

## 2018-07-13 DIAGNOSIS — Z6841 Body Mass Index (BMI) 40.0 and over, adult: Secondary | ICD-10-CM | POA: Diagnosis not present

## 2018-07-13 DIAGNOSIS — E1140 Type 2 diabetes mellitus with diabetic neuropathy, unspecified: Secondary | ICD-10-CM | POA: Diagnosis present

## 2018-07-13 DIAGNOSIS — F329 Major depressive disorder, single episode, unspecified: Secondary | ICD-10-CM | POA: Diagnosis present

## 2018-07-13 DIAGNOSIS — G4700 Insomnia, unspecified: Secondary | ICD-10-CM | POA: Diagnosis present

## 2018-07-13 LAB — BASIC METABOLIC PANEL
ANION GAP: 4 — AB (ref 5–15)
Anion gap: 5 (ref 5–15)
Anion gap: 9 (ref 5–15)
BUN: 34 mg/dL — ABNORMAL HIGH (ref 8–23)
BUN: 33 mg/dL — ABNORMAL HIGH (ref 8–23)
BUN: 35 mg/dL — ABNORMAL HIGH (ref 8–23)
CALCIUM: 7.6 mg/dL — AB (ref 8.9–10.3)
CO2: 24 mmol/L (ref 22–32)
CO2: 25 mmol/L (ref 22–32)
CO2: 22 mmol/L (ref 22–32)
Calcium: 7.8 mg/dL — ABNORMAL LOW (ref 8.9–10.3)
Calcium: 8.2 mg/dL — ABNORMAL LOW (ref 8.9–10.3)
Chloride: 110 mmol/L (ref 98–111)
Chloride: 108 mmol/L (ref 98–111)
Chloride: 110 mmol/L (ref 98–111)
Creatinine, Ser: 2.11 mg/dL — ABNORMAL HIGH (ref 0.44–1.00)
Creatinine, Ser: 2.17 mg/dL — ABNORMAL HIGH (ref 0.44–1.00)
Creatinine, Ser: 2.14 mg/dL — ABNORMAL HIGH (ref 0.44–1.00)
GFR calc Af Amer: 26 mL/min — ABNORMAL LOW (ref >60)
GFR calc Af Amer: 25 mL/min — ABNORMAL LOW (ref >60)
GFR calc Af Amer: 26 mL/min — ABNORMAL LOW (ref >60)
GFR calc non Af Amer: 23 mL/min — ABNORMAL LOW (ref >60)
GFR calc non Af Amer: 22 mL/min — ABNORMAL LOW (ref >60)
GFR, EST NON AFRICAN AMERICAN: 22 mL/min — AB (ref >60)
Glucose, Bld: 223 mg/dL — ABNORMAL HIGH (ref 70–99)
Glucose, Bld: 265 mg/dL — ABNORMAL HIGH (ref 70–99)
Glucose, Bld: 262 mg/dL — ABNORMAL HIGH (ref 70–99)
Potassium: >7.5 mmol/L (ref 3.5–5.1)
Potassium: 7.1 mmol/L (ref 3.5–5.1)
Potassium: 5.8 mmol/L — ABNORMAL HIGH (ref 3.5–5.1)
Sodium: 138 mmol/L (ref 135–145)
Sodium: 138 mmol/L (ref 135–145)
Sodium: 141 mmol/L (ref 135–145)

## 2018-07-13 LAB — RESPIRATORY PANEL BY PCR
Adenovirus: NOT DETECTED
Bordetella pertussis: NOT DETECTED
Chlamydophila pneumoniae: NOT DETECTED
Coronavirus 229E: NOT DETECTED
Coronavirus HKU1: NOT DETECTED
Coronavirus NL63: NOT DETECTED
Coronavirus OC43: NOT DETECTED
INFLUENZA A-RVPPCR: NOT DETECTED
Influenza B: NOT DETECTED
Metapneumovirus: NOT DETECTED
Mycoplasma pneumoniae: NOT DETECTED
Parainfluenza Virus 1: NOT DETECTED
Parainfluenza Virus 2: NOT DETECTED
Parainfluenza Virus 3: NOT DETECTED
Parainfluenza Virus 4: NOT DETECTED
Respiratory Syncytial Virus: NOT DETECTED
Rhinovirus / Enterovirus: NOT DETECTED

## 2018-07-13 LAB — GLUCOSE, CAPILLARY
Glucose-Capillary: 244 mg/dL — ABNORMAL HIGH (ref 70–99)
Glucose-Capillary: 225 mg/dL — ABNORMAL HIGH (ref 70–99)
Glucose-Capillary: 150 mg/dL — ABNORMAL HIGH (ref 70–99)
Glucose-Capillary: 124 mg/dL — ABNORMAL HIGH (ref 70–99)

## 2018-07-13 LAB — HEMOGLOBIN A1C
Hgb A1c MFr Bld: 6.7 % — ABNORMAL HIGH (ref 4.8–5.6)
Mean Plasma Glucose: 145.59 mg/dL

## 2018-07-13 MED ORDER — TRAZODONE HCL 50 MG PO TABS
50.0000 mg | ORAL_TABLET | Freq: Every day | ORAL | Status: DC
  Administered 2018-07-13 – 2018-07-14 (×2): 50 mg via ORAL
  Filled 2018-07-13 (×2): qty 1

## 2018-07-13 MED ORDER — INSULIN ASPART 100 UNIT/ML ~~LOC~~ SOLN
10.0000 [IU] | Freq: Once | SUBCUTANEOUS | Status: AC
  Administered 2018-07-13: 10 [IU] via INTRAVENOUS

## 2018-07-13 MED ORDER — SODIUM BICARBONATE 8.4 % IV SOLN
50.0000 meq | Freq: Once | INTRAVENOUS | Status: AC
  Administered 2018-07-13: 50 meq via INTRAVENOUS
  Filled 2018-07-13: qty 50

## 2018-07-13 MED ORDER — SODIUM POLYSTYRENE SULFONATE 15 GM/60ML PO SUSP
45.0000 g | Freq: Once | ORAL | Status: AC
  Administered 2018-07-13: 45 g via ORAL
  Filled 2018-07-13: qty 180

## 2018-07-13 MED ORDER — BUDESONIDE 0.5 MG/2ML IN SUSP
0.5000 mg | Freq: Two times a day (BID) | RESPIRATORY_TRACT | Status: DC
  Administered 2018-07-13 – 2018-07-15 (×4): 0.5 mg via RESPIRATORY_TRACT
  Filled 2018-07-13 (×4): qty 2

## 2018-07-13 MED ORDER — CALCIUM GLUCONATE-NACL 1-0.675 GM/50ML-% IV SOLN
1.0000 g | Freq: Once | INTRAVENOUS | Status: AC
  Administered 2018-07-13: 1000 mg via INTRAVENOUS
  Filled 2018-07-13: qty 50

## 2018-07-13 MED ORDER — DEXTROSE 50 % IV SOLN
1.0000 | Freq: Once | INTRAVENOUS | Status: AC
  Administered 2018-07-13: 50 mL via INTRAVENOUS
  Filled 2018-07-13: qty 50

## 2018-07-13 MED ORDER — SODIUM POLYSTYRENE SULFONATE 15 GM/60ML PO SUSP
30.0000 g | Freq: Once | ORAL | Status: AC
  Administered 2018-07-13: 30 g via ORAL
  Filled 2018-07-13: qty 120

## 2018-07-13 MED ORDER — ENOXAPARIN SODIUM 30 MG/0.3ML ~~LOC~~ SOLN
30.0000 mg | SUBCUTANEOUS | Status: DC
  Administered 2018-07-13 – 2018-07-14 (×2): 30 mg via SUBCUTANEOUS
  Filled 2018-07-13 (×2): qty 0.3

## 2018-07-13 NOTE — Progress Notes (Signed)
CRITICAL VALUE ALERT  Critical Value:  Potassium 7.1  Date & Time Notied:  07/13/2018 0831  Provider Notified: Tat text paged

## 2018-07-13 NOTE — Progress Notes (Signed)
CRITICAL VALUE ALERT  Critical Value: Potassium 7.5  Date & Time Notied: 07-13-18   Provider Notified: tat, d md  Orders Received/Actions taken: Orders received and given to next nurse.

## 2018-07-13 NOTE — Progress Notes (Signed)
Respiratory swab for viral respiratory panel has been collected and sent to the lab.

## 2018-07-13 NOTE — Progress Notes (Signed)
PROGRESS NOTE  [name]:[id number] DOB: 1946/02/08 DOA: 07/12/2018 PCP: [name], MD  Brief History:  72 year old female with a history of diastolic CHF, hypertension, diabetes mellitus, CKD stage III, hyperlipidemia, questionable COPD presenting with 2 to 3-week history of increasing shortness of breath that has significantly worsened over the past 2 to 3 days.  Patient complains of a nonproductive cough without hemoptysis.  She denies any fevers, chills, nausea, vomiting, diarrhea patient has had an intermittent headache for the past week without any visual disturbance or focal extremity weakness.  She states that her lower extremity edema is better than usual, but she has had some PND type symptoms.  She denies any dysuria, hematuria, hematochezia, melena.  The patient usually wears 2 L with her CPAP at nighttime and intermittently during the day.  She denies any recent sick contacts.  In the emergency department, BMP showed a potassium 5.6, but her serum creatinine was at baseline.  WBC was 7.6.  Influenza PCR was negative.  BNP was 136.  Chest x-ray showed chronic interstitial markings.  Patient was started on Solu-Medrol.  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  Acute on chronic renal failure--CKD stage III -May be in part due to relative hypotension/hemodynamic changes and recent NSAID use -Hold furosemide -Renal ultrasound -Baseline creatinine 1.3-1.6 -Serum creatinine peaked 2.17  Chronic respiratory failure with hypoxia -Normally on 2 L at nighttime  Diabetes mellitus type  2 -NovoLog sliding scale--increase to resistant scale -Anticipate elevated CBGs in the setting of steroids -Conitnue Lantus 15 units daily -Hemoglobin V4Q  Chronic diastolic CHF -Holding furosemide today and monitor clinically in the setting of acute on chronic renal failure -Daily weights -Admission weight 213 pounds  Essential hypertension -Continue metoprolol succinate and amlodipine  Hyperlipidemia -Continue statin  Morbid obesity -BMI 47.9 -lifestyle modification      Disposition Plan:   Home in 2-3 days Family Communication:  No Family at bedside  Consultants:  none  Code Status:  FULL  DVT Prophylaxis:  [place] Lovenox   Procedures: As Listed in Progress Note Above  Antibiotics: azithro 12/8>>>       Subjective: Pt feels breathing a little better today, but remains sob with exertion.  States LE edema is better than usual.  Denies cp, n/v/d, f/c, headache.  No hemoptysis  Objective: Vitals:   07/12/18 2133 07/12/18 2207 07/13/18 0822 07/13/18 0834  BP:  (!) 104/44    Pulse:  93    Resp:  20    Temp:  98.5 F (36.9 C)    TempSrc:  Oral    SpO2: 98% 92%  93%  Weight:   97 kg   Height:        Intake/Output Summary (Last 24 hours) at 07/13/2018 0836 Last data filed at 07/12/2018 1858 Gross per 24 hour  Intake 493.44 ml  Output -  Net 493.44 ml   Weight change:  Exam:   General:  Pt is alert, follows commands appropriately, not in acute distress  HEENT: No icterus, No thrush, No neck mass, [place]/AT  Cardiovascular: RRR, S1/S2, no rubs, no gallops  Respiratory: bibasilar rales.  Bibasilar expiratory wheeze  Abdomen: Soft/+BS, non tender, non distended,  no guarding  Extremities: 2 + LE edema, No lymphangitis, No petechiae, No rashes, no synovitis   Data Reviewed: I have personally reviewed following labs and imaging studies Basic Metabolic Panel: Recent Labs  Lab 07/12/18 1335 07/13/18 0624 07/13/18 0809  NA 139 138 138  K 5.6*  >7.5* 7.1*  CL 109 110 108  CO2 23 24 25   GLUCOSE 139* 223* 265*  BUN 22 34* 33*  CREATININE 1.37* 2.11* 2.17*  CALCIUM 8.2* 7.8* 7.6*   Liver Function Tests: No results for input(s): AST, ALT, ALKPHOS, BILITOT, PROT, ALBUMIN in the last 168 hours. No results for input(s): LIPASE, AMYLASE in the last 168 hours. No results for input(s): AMMONIA in the last 168 hours. Coagulation Profile: No results for input(s): INR, PROTIME in the last 168 hours. CBC: Recent Labs  Lab 07/12/18 1335  WBC 7.6  NEUTROABS 5.3  HGB 10.9*  HCT 35.7*  MCV 92.2  PLT 255   Cardiac Enzymes: Recent Labs  Lab 07/12/18 1335  TROPONINI <0.03   BNP: Invalid input(s): POCBNP CBG: Recent Labs  Lab 07/13/18 0810  GLUCAP 244*   HbA1C: No results for input(s): HGBA1C in the last 72 hours. Urine analysis:    Component Value Date/Time   COLORURINE YELLOW 07/12/2018 1535   APPEARANCEUR HAZY (A) 07/12/2018 1535   LABSPEC 1.012 07/12/2018 1535   PHURINE 6.0 07/12/2018 1535   GLUCOSEU NEGATIVE 07/12/2018 1535   HGBUR NEGATIVE 07/12/2018 1535   BILIRUBINUR NEGATIVE 07/12/2018 1535   KETONESUR NEGATIVE 07/12/2018 1535   PROTEINUR 100 (A) 07/12/2018 1535   UROBILINOGEN 0.2 01/29/2007 1500   NITRITE NEGATIVE 07/12/2018 1535   LEUKOCYTESUR NEGATIVE 07/12/2018 1535   Sepsis Labs: @LABRCNTIP (procalcitonin:4,lacticidven:4) )No results found for this or any previous visit (from the past 240 hour(s)).   Scheduled Meds: . allopurinol  100 mg Oral BID  . amitriptyline  100 mg Oral QHS  . amLODipine  5 mg Oral Daily  . aspirin EC  81 mg Oral Daily  . atorvastatin  40 mg Oral QHS  . dextromethorphan-guaiFENesin  1 tablet Oral BID  . enoxaparin (LOVENOX) injection  30 mg Subcutaneous Q24H  . furosemide  60 mg Oral QODAY  . furosemide  80 mg Oral QODAY  . gabapentin  300 mg Oral Daily  . insulin aspart  0-15 Units Subcutaneous TID WC  . insulin glargine  15 Units Subcutaneous QHS  .  ipratropium-albuterol  3 mL Nebulization Q6H  . methylPREDNISolone (SOLU-MEDROL) injection  60 mg Intravenous Q12H  . metoprolol succinate  50 mg Oral Daily  . pantoprazole  40 mg Oral Daily  . pneumococcal 23 valent vaccine  0.5 mL Intramuscular Tomorrow-1000   Continuous Infusions: . sodium chloride 10 mL/hr at 07/12/18 1800  . azithromycin Stopped (07/12/18 1739)  . calcium gluconate 1,000 mg (07/13/18 0829)    Procedures/Studies: Dg Chest Port 1 View  Result Date: 07/12/2018 CLINICAL DATA:  Cough and shortness of breath. EXAM: PORTABLE CHEST 1 VIEW COMPARISON:  05/08/2016 FINDINGS: Stable cardiac enlargement and tortuous ectatic thoracic aorta. The right IJ Port-A-Cath is stable. The lungs are grossly clear. No obvious infiltrates, edema or effusions. IMPRESSION: Cardiac enlargement but no acute pulmonary findings. Electronically Signed   By: Marijo Sanes M.D.   On: 07/12/2018 14:00    Orson Eva, DO  [facility] Pager [phone]  If 7PM-7AM, please contact night-coverage [email or web address] Password [email or web address] 07/13/2018, 8:36 AM   LOS: 0 days

## 2018-07-13 NOTE — Progress Notes (Signed)
Patient declined the use of a CPAP this evening due to excessive coughing. She is resting comfortably on 2lplm [facility] and patient informed that at any moment if she wanted the use of a CPAP that one would be made  available to her.

## 2018-07-14 ENCOUNTER — Inpatient Hospital Stay (HOSPITAL_COMMUNITY): Payer: Medicare Other

## 2018-07-14 LAB — PROTEIN / CREATININE RATIO, URINE
Creatinine, Urine: 174.7 mg/dL
Protein Creatinine Ratio: 0.19 mg/mg{Cre} — ABNORMAL HIGH (ref 0.00–0.15)
Total Protein, Urine: 33 mg/dL

## 2018-07-14 LAB — GLUCOSE, CAPILLARY
Glucose-Capillary: 208 mg/dL — ABNORMAL HIGH (ref 70–99)
Glucose-Capillary: 222 mg/dL — ABNORMAL HIGH (ref 70–99)
Glucose-Capillary: 130 mg/dL — ABNORMAL HIGH (ref 70–99)
Glucose-Capillary: 183 mg/dL — ABNORMAL HIGH (ref 70–99)

## 2018-07-14 LAB — BASIC METABOLIC PANEL
Anion gap: 10 (ref 5–15)
BUN: 40 mg/dL — ABNORMAL HIGH (ref 8–23)
CHLORIDE: 109 mmol/L (ref 98–111)
CO2: 22 mmol/L (ref 22–32)
CREATININE: 2.09 mg/dL — AB (ref 0.44–1.00)
Calcium: 8 mg/dL — ABNORMAL LOW (ref 8.9–10.3)
GFR calc Af Amer: 27 mL/min — ABNORMAL LOW (ref >60)
GFR calc non Af Amer: 23 mL/min — ABNORMAL LOW (ref >60)
Glucose, Bld: 234 mg/dL — ABNORMAL HIGH (ref 70–99)
Potassium: 5.5 mmol/L — ABNORMAL HIGH (ref 3.5–5.1)
Sodium: 141 mmol/L (ref 135–145)

## 2018-07-14 MED ORDER — SODIUM POLYSTYRENE SULFONATE 15 GM/60ML PO SUSP
30.0000 g | Freq: Once | ORAL | Status: AC
  Administered 2018-07-14: 30 g via ORAL
  Filled 2018-07-14: qty 120

## 2018-07-14 MED ORDER — INSULIN GLARGINE 100 UNIT/ML ~~LOC~~ SOLN
20.0000 [IU] | Freq: Every day | SUBCUTANEOUS | Status: DC
  Administered 2018-07-14: 20 [IU] via SUBCUTANEOUS
  Filled 2018-07-14 (×2): qty 0.2

## 2018-07-14 MED ORDER — AZITHROMYCIN 250 MG PO TABS
500.0000 mg | ORAL_TABLET | Freq: Every day | ORAL | Status: DC
  Administered 2018-07-14 – 2018-07-15 (×2): 500 mg via ORAL
  Filled 2018-07-14 (×2): qty 2

## 2018-07-14 NOTE — Progress Notes (Signed)
PROGRESS NOTE  [name] YYT:[id number] DOB: 08/30/1945 DOA: 07/12/2018 PCP: [name], MD   Brief History:  72 year old female with a history of diastolic CHF, hypertension, diabetes mellitus, CKD stage III, hyperlipidemia, questionable COPD presenting with 2 to 3-week history of increasing shortness of breath that has significantly worsened over the past 2 to 3 days.  Patient complains of a nonproductive cough without hemoptysis.  She denies any fevers, chills, nausea, vomiting, diarrhea patient has had an intermittent headache for the past week without any visual disturbance or focal extremity weakness.  She states that her lower extremity edema is better than usual, but she has had some PND type symptoms.  She denies any dysuria, hematuria, hematochezia, melena.  The patient usually wears 2 L with her CPAP at nighttime and intermittently during the day.  She denies any recent sick contacts.  In the emergency department, BMP showed a potassium 5.6, but her serum creatinine was at baseline.  WBC was 7.6.  Influenza PCR was negative.  BNP was 136.  Chest x-ray showed chronic interstitial markings.  Patient was started on Solu-Medrol with clinical improvement.  The pt was noted to have hyperkalemia with K peaking at 7.1.  She was given temporizing measures and multiple doses of kayexalate.  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  Acute on chronic renal failure--CKD stage III -May be in part due to relative hypotension/hemodynamic changes and recent NSAID use, lasix -Hold furosemide -Baseline  creatinine 1.3-1.6 -Serum creatinine peaked 2.17  Chronic respiratory failure with hypoxia -Normally on 2 L at nighttime  Diabetes mellitus type 2 -NovoLog sliding scale--increase to resistant scale -Anticipate elevated CBGs in the setting of steroids -Increase Lantus 20 units daily -Hemoglobin E7N--1.7  Chronic diastolic CHF -Holding furosemide today and monitor clinically in the setting of acute on chronic renal failure -Daily weights -Admission weight 213 pounds -weight remains stable off furosemide  Lower extremity edema -venous duplex -urine protein creatinine ratio  Essential hypertension -Continue metoprolol succinate and amlodipine  Hyperlipidemia -Continue statin  Morbid obesity -BMI 47.9 -lifestyle modification      Disposition Plan:   Home 12/11 if K and serum creatinine improved Family Communication:  Son updated at bedside 12/10  Consultants:  none  Code Status:  FULL  DVT Prophylaxis:  [place] Lovenox   Procedures: As Listed in Progress Note Above  Antibiotics: azithro 12/8>>>        Subjective: She feels her breathing is improving.  She is able to lie flat.  She denies f/c, cp, n/v/.  +loose stools from kayexalate.  No abdominal pain or dysuria  Objective: Vitals:   07/13/18 2131 07/14/18 0619 07/14/18 0738 07/14/18 0743  BP: (!) 155/64 (!) 117/48    Pulse: 89 94    Resp:      Temp: 98.3 F (36.8 C) 98.2 F (36.8 C)    TempSrc: Oral Oral    SpO2: 99% 90% 92% 92%  Weight:      Height:        Intake/Output Summary (Last 24 hours) at 07/14/2018 1255 Last data filed at 07/14/2018 1100 Gross per 24 hour  Intake 1342.6 ml  Output -  Net 1342.6 ml  Weight change: 4.491 kg Exam:   General:  Pt is alert, follows commands appropriately, not in acute distress  HEENT: No icterus, No thrush, No neck mass, [place]/AT  Cardiovascular: RRR, S1/S2, no rubs, no gallops  Respiratory: diminished breath sounds  bilateral. Bibasilar rales,.  No wheeze  Abdomen: Soft/+BS, non tender, non distended, no guarding  Extremities: 1+ LE edema, No lymphangitis, No petechiae, No rashes, no synovitis   Data Reviewed: I have personally reviewed following labs and imaging studies Basic Metabolic Panel: Recent Labs  Lab 07/12/18 1335 07/13/18 0624 07/13/18 0809 07/13/18 1123 07/14/18 0519  NA 139 138 138 141 141  K 5.6* >7.5* 7.1* 5.8* 5.5*  CL 109 110 108 110 109  CO2 23 24 25 22 22   GLUCOSE 139* 223* 265* 262* 234*  BUN 22 34* 33* 35* 40*  CREATININE 1.37* 2.11* 2.17* 2.14* 2.09*  CALCIUM 8.2* 7.8* 7.6* 8.2* 8.0*   Liver Function Tests: No results for input(s): AST, ALT, ALKPHOS, BILITOT, PROT, ALBUMIN in the last 168 hours. No results for input(s): LIPASE, AMYLASE in the last 168 hours. No results for input(s): AMMONIA in the last 168 hours. Coagulation Profile: No results for input(s): INR, PROTIME in the last 168 hours. CBC: Recent Labs  Lab 07/12/18 1335  WBC 7.6  NEUTROABS 5.3  HGB 10.9*  HCT 35.7*  MCV 92.2  PLT 255   Cardiac Enzymes: Recent Labs  Lab 07/12/18 1335  TROPONINI <0.03   BNP: Invalid input(s): POCBNP CBG: Recent Labs  Lab 07/13/18 1113 07/13/18 1620 07/13/18 2159 07/14/18 0813 07/14/18 1120  GLUCAP 225* 150* 124* 208* 222*   HbA1C: Recent Labs    07/13/18 1123  HGBA1C 6.7*   Urine analysis:    Component Value Date/Time   COLORURINE YELLOW 07/12/2018 1535   APPEARANCEUR HAZY (A) 07/12/2018 1535   LABSPEC 1.012 07/12/2018 1535   PHURINE 6.0 07/12/2018 1535   GLUCOSEU NEGATIVE 07/12/2018 1535   HGBUR NEGATIVE 07/12/2018 1535   BILIRUBINUR NEGATIVE 07/12/2018 1535   KETONESUR NEGATIVE 07/12/2018 1535   PROTEINUR 100 (A) 07/12/2018 1535   UROBILINOGEN 0.2 01/29/2007 1500   NITRITE NEGATIVE 07/12/2018 1535   LEUKOCYTESUR NEGATIVE 07/12/2018 1535   Sepsis Labs: @LABRCNTIP (procalcitonin:4,lacticidven:4) ) Recent Results (from the past 240  hour(s))  Respiratory Panel by PCR     Status: None   Collection Time: 07/13/18  8:55 AM  Result Value Ref Range Status   Adenovirus NOT DETECTED NOT DETECTED Final   Coronavirus 229E NOT DETECTED NOT DETECTED Final   Coronavirus HKU1 NOT DETECTED NOT DETECTED Final   Coronavirus NL63 NOT DETECTED NOT DETECTED Final   Coronavirus OC43 NOT DETECTED NOT DETECTED Final   Metapneumovirus NOT DETECTED NOT DETECTED Final   Rhinovirus / Enterovirus NOT DETECTED NOT DETECTED Final   Influenza A NOT DETECTED NOT DETECTED Final   Influenza B NOT DETECTED NOT DETECTED Final   Parainfluenza Virus 1 NOT DETECTED NOT DETECTED Final   Parainfluenza Virus 2 NOT DETECTED NOT DETECTED Final   Parainfluenza Virus 3 NOT DETECTED NOT DETECTED Final   Parainfluenza Virus 4 NOT DETECTED NOT DETECTED Final   Respiratory Syncytial Virus NOT DETECTED NOT DETECTED Final   Bordetella pertussis NOT DETECTED NOT DETECTED Final   Chlamydophila pneumoniae NOT DETECTED NOT DETECTED Final   Mycoplasma pneumoniae NOT DETECTED NOT DETECTED Final    Comment: Performed at [facility], [place]. [address], [place], [place] [address]     Scheduled Meds: . allopurinol  100 mg Oral BID  .  amitriptyline  100 mg Oral QHS  . amLODipine  5 mg Oral Daily  . aspirin EC  81 mg Oral Daily  . atorvastatin  40 mg Oral QHS  . azithromycin  500 mg Oral Daily  . budesonide (PULMICORT) nebulizer solution  0.5 mg Nebulization BID  . dextromethorphan-guaiFENesin  1 tablet Oral BID  . enoxaparin (LOVENOX) injection  30 mg Subcutaneous Q24H  . gabapentin  300 mg Oral Daily  . insulin aspart  0-15 Units Subcutaneous TID WC  . insulin glargine  20 Units Subcutaneous QHS  . ipratropium-albuterol  3 mL Nebulization Q6H  . methylPREDNISolone (SOLU-MEDROL) injection  60 mg Intravenous Q12H  . metoprolol succinate  50 mg Oral Daily  . pantoprazole  40 mg Oral Daily  . pneumococcal 23 valent vaccine  0.5 mL Intramuscular Tomorrow-1000  .  traZODone  50 mg Oral QHS   Continuous Infusions: . sodium chloride 10 mL/hr at 07/12/18 1800    Procedures/Studies: Dg Chest Port 1 View  Result Date: 07/12/2018 CLINICAL DATA:  Cough and shortness of breath. EXAM: PORTABLE CHEST 1 VIEW COMPARISON:  05/08/2016 FINDINGS: Stable cardiac enlargement and tortuous ectatic thoracic aorta. The right IJ Port-A-Cath is stable. The lungs are grossly clear. No obvious infiltrates, edema or effusions. IMPRESSION: Cardiac enlargement but no acute pulmonary findings. Electronically Signed   By: Marijo Sanes M.D.   On: 07/12/2018 14:00    Orson Eva, DO  [facility] Pager [phone]  If 7PM-7AM, please contact night-coverage [email or web address] Password [email or web address] 07/14/2018, 12:55 PM   LOS: 1 day

## 2018-07-14 NOTE — Care Management Note (Signed)
Case Management Note  Patient Details  Name: [name] MRN: [id number] Date of Birth: 1945/09/16  Subjective/Objective:          Admitted with COPD. Pt from home, lives alone, has strong support from son. Pt ind with ADL's. Drives to appointments, does her own shopping and med management. Has meds filled at [facility]. Has home oxygen (port concentrator), cpap and neb machine. Pt uses a RW with ambulation sometimes. This is pts only admission in 6 months. She is a high readmission risk d/t number of medications. communicates no needs or concerns.           Action/Plan: DC home with self care. No CM needs noted.   Expected Discharge Date:     07/15/18             Expected Discharge Plan:  Home/Self Care  In-House Referral:  NA  Discharge planning Services  CM Consult  Post Acute Care Choice:  NA Choice offered to:  NA  Status of Service:  Completed, signed off  Sherald Barge, RN 07/14/2018, 3:21 PM

## 2018-07-15 DIAGNOSIS — I5032 Chronic diastolic (congestive) heart failure: Secondary | ICD-10-CM

## 2018-07-15 DIAGNOSIS — K219 Gastro-esophageal reflux disease without esophagitis: Secondary | ICD-10-CM

## 2018-07-15 DIAGNOSIS — E1121 Type 2 diabetes mellitus with diabetic nephropathy: Secondary | ICD-10-CM

## 2018-07-15 DIAGNOSIS — N183 Chronic kidney disease, stage 3 (moderate): Secondary | ICD-10-CM

## 2018-07-15 DIAGNOSIS — I10 Essential (primary) hypertension: Secondary | ICD-10-CM

## 2018-07-15 DIAGNOSIS — N179 Acute kidney failure, unspecified: Secondary | ICD-10-CM

## 2018-07-15 DIAGNOSIS — J441 Chronic obstructive pulmonary disease with (acute) exacerbation: Principal | ICD-10-CM

## 2018-07-15 LAB — BASIC METABOLIC PANEL
Anion gap: 7 (ref 5–15)
BUN: 48 mg/dL — ABNORMAL HIGH (ref 8–23)
CALCIUM: 7.4 mg/dL — AB (ref 8.9–10.3)
CO2: 26 mmol/L (ref 22–32)
CREATININE: 1.98 mg/dL — AB (ref 0.44–1.00)
Chloride: 107 mmol/L (ref 98–111)
GFR calc Af Amer: 28 mL/min — ABNORMAL LOW (ref >60)
GFR, EST NON AFRICAN AMERICAN: 24 mL/min — AB (ref >60)
Glucose, Bld: 251 mg/dL — ABNORMAL HIGH (ref 70–99)
Potassium: 5.2 mmol/L — ABNORMAL HIGH (ref 3.5–5.1)
Sodium: 140 mmol/L (ref 135–145)

## 2018-07-15 LAB — GLUCOSE, CAPILLARY
Glucose-Capillary: 225 mg/dL — ABNORMAL HIGH (ref 70–99)
Glucose-Capillary: 250 mg/dL — ABNORMAL HIGH (ref 70–99)

## 2018-07-15 MED ORDER — DM-GUAIFENESIN ER 30-600 MG PO TB12: 1.0000 | ORAL_TABLET | Freq: Two times a day (BID) | ORAL | 0 refills | Status: DC

## 2018-07-15 MED ORDER — IPRATROPIUM-ALBUTEROL 20-100 MCG/ACT IN AERS: 1.0000 | INHALATION_SPRAY | Freq: Four times a day (QID) | RESPIRATORY_TRACT | 2 refills | Status: DC | PRN

## 2018-07-15 MED ORDER — SODIUM ZIRCONIUM CYCLOSILICATE 5 G PO PACK
5.0000 g | PACK | Freq: Every day | ORAL | Status: DC
  Administered 2018-07-15: 5 g via ORAL
  Filled 2018-07-15 (×3): qty 1

## 2018-07-15 MED ORDER — FUROSEMIDE 20 MG PO TABS: 40.0000 mg | ORAL_TABLET | Freq: Every day | ORAL | 2 refills | Status: DC

## 2018-07-15 MED ORDER — SODIUM ZIRCONIUM CYCLOSILICATE 5 G PO PACK: 5.0000 g | PACK | Freq: Every day | ORAL | 0 refills | Status: DC

## 2018-07-15 MED ORDER — PREDNISONE 20 MG PO TABS: 40.0000 mg | ORAL_TABLET | Freq: Every day | ORAL | 0 refills | Status: AC

## 2018-07-15 MED ORDER — FLUTICASONE-SALMETEROL 250-50 MCG/DOSE IN AEPB: 1.0000 | INHALATION_SPRAY | Freq: Two times a day (BID) | RESPIRATORY_TRACT | 2 refills | Status: DC

## 2018-07-15 MED ORDER — AZITHROMYCIN 250 MG PO TABS: ORAL_TABLET | ORAL | 0 refills | Status: DC

## 2018-07-15 NOTE — Progress Notes (Signed)
Patient discharged home with instructions given on medications and follow up visits,patient  verbalized understanding. Prescriptions sent to Pharmacy of choice documented on AVS. Accompanied by staff to an awaiting vehicle.

## 2018-07-15 NOTE — Progress Notes (Signed)
SATURATION QUALIFICATIONS: (This note is used to comply with regulatory documentation for home oxygen)  Patient Saturations on Room Air at Rest = 95%  Patient Saturations on Room Air while Ambulating = 94%  Patient Saturations on 0 Liters of oxygen while Ambulating n/a  Please briefly explain why patient needs home oxygen:

## 2018-07-15 NOTE — Discharge Summary (Signed)
Physician Discharge Summary  [name] BLT:[id number] DOB: July 05, 1946 DOA: 07/12/2018  PCP: [name], MD  Admit date: 07/12/2018 Discharge date: 07/15/2018  Time spent: 35 minutes  Recommendations for Outpatient Follow-up:  Repeat basic metabolic panel to follow electrolytes and renal function Reassess patient blood pressure and further adjust antihypertensive regimen Follow volume status and if needed adjust diuretics regimen Outpatient follow-up with pulmonary service for PFTs and further adjustment on her chronic maintenance COPD management recommended.  Discharge Diagnoses:  Active Problems:   Benign essential HTN   Type 2 diabetes with nephropathy (HCC)   Morbid (severe) obesity due to excess calories (HCC)   COPD exacerbation (HCC)   Chronic respiratory failure with hypoxia (HCC)   Acute renal failure superimposed on stage 3 chronic kidney disease (HCC)   Chronic diastolic CHF (congestive heart failure) (HCC)   COPD (chronic obstructive pulmonary disease) ([place])   Discharge Condition: Stable and improved.  Patient discharged home with instruction to follow-up with PCP  Diet recommendation: Modified carbohydrate diet, heart healthy, low calorie.  Filed Weights   07/12/18 1847 07/13/18 0822  Weight: 92.5 kg 97 kg    History of present illness:  As per H&P written by Dr. Denton [name] on 07/12/2018 72 y.o. female with medical history significant for COPD, Diastolic CHF, HTN, DM, CKD3, who presented to the ED with complaints of 2 weeks of increasing shortness of breath and cough.  Cough is mostly dry but she feels she cannot cough stuff up.  She denies chest pain.  No fevers.  No sick contacts.  No rhinorrhea or myalgia.  Never been intubated.  Quit smoking ~69yrs ago, but worked in [employer].  O2 by nasal cannula at night with CPAP, and as needed during the day. Reports compliance with Lasix daily.  Lower extremity swelling, improved compared to prior.  ED  Course: Elevated blood pressure, O2 sats greater than 94% on room air.  Mild potassium elevation to 5.6.  BNP 136.  Two-view chest x-ray negative for acute abnormality.  Old T wave inversions, unchanged from prior.  Patient given 125 Solu-Medrol duo nebs.  On attempt at ambulation patient became significantly short of breath, coughing, wheezing, heart rate increased to 140s, O2 sats remained above 94%, hence hospitalist called for admission for COPD exacerbation.  Hospital Course:  1-COPD exacerbation -Appears to be secondary to bronchiectasis -Prior to admission patient was not using any adequate regimen for control of chronic obstructive pulmonary disease. -Viral respiratory panel negative and negative influenza by PCR.  -Patient will be discharged home to complete oral steroid therapy, antibiotics, initiation of Advair twice a day and as needed Combivent as rescue inhaler. -She will continue using Mucinex and flutter valve. -At discharge no oxygen supplementation was needed. -Patient will benefit of outpatient follow-up with pulmonary service for PFTs and further adjustment on her long-term management of COPD.  2-hyperkalemia -In the setting of acute on chronic renal failure -Patient was treated with Kayexalate and Lokelma -2 more days of Lokelma will be given -Patient will be resuming the use of her daily furosemide on 07/18/2017 -Will recommend repeat basic metabolic panel follow-up visit to reassess electrolytes trend. -Patient advised to keep herself well-hydrated and to decrease potassium intake in her diet.  3-acute on chronic renal failure: Chronic kidney disease stage III at baseline -Appears to be in part secondary to hemodynamic changes and recent NSAID use along with diuretics. -Continue holding furosemide for 2 more days -Patient baseline creatinine is 1.3-1.6 -Creatinine at discharge 1.9 -  Advised to keep herself well-hydrated -Safe to resume diuretics on 07/18/2017 -Will  recommend repeat basic metabolic panel follow-up visit to reassess electrolytes trend.  4-chronic diastolic heart failure -Appears to be compensated currently -Advised to follow low-sodium diet and to follow daily weights -Resume diuretic regimen on 09/18/2016 -Continue metoprolol  5-morbid obesity with obstructive sleep apnea -Low calorie diet, portion control and increase physical activity has been discussed with patient -Body mass index is 47.96 kg/m. -Continue the use of CPAP at bedtime.  6-hyperlipidemia -Continue statins  7-GERD -Continue PPI  8-depression/insomnia -continue amitriptyline  9-type 2 diabetes mellitus with nephropathy and neuropathy -Resume home hypoglycemic regimen -Continue Neurontin -Outpatient follow-up with PCP to further adjust medication regimen as needed.  Procedures:  See below for x-ray reports.  Consultations:  None  Discharge Exam: Vitals:   07/15/18 1350 07/15/18 1401  BP: (!) 144/68   Pulse: 72   Resp: 20   Temp: (!) 97.5 F (36.4 C)   SpO2: 94% 95%    General: Afebrile, in no acute distress.  Patient able to speak in full sentences and having good oxygen saturation on room air. Cardiovascular: S1 and S2, no rubs, no gallops, no murmurs. Respiratory: Improved air movement bilaterally, positive rhonchi right, currently no active expiratory wheezing appreciated on exam.  Normal respiratory effort. Abdomen: Soft, nontender at, obese, no guarding, positive bowel sounds Extremities: Trace edema bilaterally, no cyanosis, no clubbing  Discharge Instructions   Discharge Instructions    Diet - low sodium heart healthy   Complete by:  As directed    Discharge instructions   Complete by:  As directed    Take medications as prescribed Keep yourself well-hydrated Arrange follow-up with PCP in 10 days Complete steroids therapy and use inhalers as prescribed Follow low-sodium and low-calorie diet.     Allergies as of 07/15/2018    No Known Allergies     Medication List    STOP taking these medications   Misc. Devices Misc     TAKE these medications   allopurinol 100 MG tablet Commonly known as:  ZYLOPRIM Take 1 tablet by mouth 2 (two) times daily.   amitriptyline 100 MG tablet Commonly known as:  ELAVIL Take 100 mg by mouth at bedtime.   amLODipine 5 MG tablet Commonly known as:  NORVASC Take 1 tablet (5 mg total) by mouth daily.   aspirin EC 81 MG tablet Take 81 mg by mouth daily.   atorvastatin 40 MG tablet Commonly known as:  LIPITOR Take 1 tablet (40 mg total) by mouth at bedtime.   azithromycin 250 MG tablet Commonly known as:  ZITHROMAX Treatment of bronchiectasis Start taking on:  07/16/2018   BIOTIN PO Take 1 capsule by mouth daily.   dextromethorphan-guaiFENesin 30-600 MG 12hr tablet Commonly known as:  MUCINEX DM Take 1 tablet by mouth 2 (two) times daily.   esomeprazole 40 MG capsule Commonly known as:  NEXIUM TAKE 1 CAPSULE TWICE A DAY 30 MINUTES PRIOR TO MEALS What changed:  See the new instructions.   famotidine 40 MG tablet Commonly known as:  PEPCID TAKE 1 TABLET AT BEDTIME AS NEEDED TO CONTROL REFLUX What changed:    how much to take  how to take this  when to take this  reasons to take this  additional instructions   Fluticasone-Salmeterol 250-50 MCG/DOSE Aepb Commonly known as:  ADVAIR Inhale 1 puff into the lungs 2 (two) times daily.   furosemide 20 MG tablet Commonly known as:  LASIX Take  2 tablets (40 mg total) by mouth daily. Take 80 mg alternating with 60 mg every other day Start taking on:  07/18/2018 What changed:    medication strength  how much to take  These instructions start on 07/18/2018. If you are unsure what to do until then, ask your doctor or other care provider.   gabapentin 300 MG capsule Commonly known as:  NEURONTIN Take 1 capsule by mouth daily.   insulin lispro 100 UNIT/ML KiwkPen Commonly known as:  HUMALOG Inject  12-18 Units into the skin See admin instructions. Uses sliding scale and injects with meals.  Least she injects is 12 units. Most is 18 units.   Ipratropium-Albuterol 20-100 MCG/ACT Aers respimat Commonly known as:  COMBIVENT Inhale 1 puff into the lungs every 6 (six) hours as needed for wheezing or shortness of breath.   LANTUS SOLOSTAR 100 UNIT/ML Solostar Pen Generic drug:  Insulin Glargine Inject 20 Units into the skin at bedtime.   loperamide 2 MG capsule Commonly known as:  IMODIUM Take 2 mg by mouth as needed for diarrhea or loose stools.   meclizine 25 MG tablet Commonly known as:  ANTIVERT Take 1 tablet (25 mg total) by mouth 3 (three) times daily as needed for dizziness.   metoprolol succinate 50 MG 24 hr tablet Commonly known as:  TOPROL-XL Take 1 tablet (50 mg total) by mouth daily. Take with or immediately following a meal.   predniSONE 20 MG tablet Commonly known as:  DELTASONE Take 2 tablets (40 mg total) by mouth daily with breakfast for 5 days. And then stop the use of prednisone.   sodium zirconium cyclosilicate 5 g packet Commonly known as:  LOKELMA Take 5 g by mouth daily. Start taking on:  07/16/2018      No Known Allergies Follow-up Information    [name], MD. Schedule an appointment as soon as possible for a visit in 10 day(s).   Specialty:  Family Medicine Contact information: [place] [address] [phone]        Satira Sark, MD .   Specialty:  Cardiology Contact information: [place] [address] [phone]           The results of significant diagnostics from this hospitalization (including imaging, microbiology, ancillary and laboratory) are listed below for reference.    Significant Diagnostic Studies: US Venous Img Lower Bilateral  Result Date: 07/14/2018 CLINICAL DATA:  72 year old with bilateral lower extremity pain and edema. EXAM: BILATERAL LOWER EXTREMITY VENOUS  DOPPLER ULTRASOUND TECHNIQUE: Gray-scale sonography with graded compression, as well as color Doppler and duplex ultrasound were performed to evaluate the lower extremity deep venous systems from the level of the common femoral vein and including the common femoral, femoral, profunda femoral, popliteal and calf veins including the posterior tibial, peroneal and gastrocnemius veins when visible. The superficial great saphenous vein was also interrogated. Spectral Doppler was utilized to evaluate flow at rest and with distal augmentation maneuvers in the common femoral, femoral and popliteal veins. COMPARISON:  None. FINDINGS: RIGHT LOWER EXTREMITY Common Femoral Vein: No evidence of thrombus. Normal compressibility, respiratory phasicity and response to augmentation. Saphenofemoral Junction: No evidence of thrombus. Normal compressibility and flow on color Doppler imaging. Profunda Femoral Vein: No evidence of thrombus. Normal compressibility and flow on color Doppler imaging. Femoral Vein: No evidence of thrombus. Normal compressibility, respiratory phasicity and response to augmentation. Popliteal Vein: No evidence of thrombus. Normal compressibility, respiratory phasicity and  response to augmentation. Calf Veins: No evidence of thrombus. Normal compressibility and flow on color Doppler imaging. Superficial Great Saphenous Vein: No evidence of thrombus. Normal compressibility. Venous Reflux:  None. Other Findings:  Subcutaneous edema. LEFT LOWER EXTREMITY Common Femoral Vein: No evidence of thrombus. Normal compressibility, respiratory phasicity and response to augmentation. Saphenofemoral Junction: No evidence of thrombus. Normal compressibility and flow on color Doppler imaging. Profunda Femoral Vein: No evidence of thrombus. Normal compressibility and flow on color Doppler imaging. Femoral Vein: No evidence of thrombus. Normal compressibility, respiratory phasicity and response to augmentation. Popliteal Vein:  No evidence of thrombus. Normal compressibility, respiratory phasicity and response to augmentation. Calf Veins: No evidence of thrombus. Normal compressibility and flow on color Doppler imaging. Superficial Great Saphenous Vein: No evidence of thrombus. Normal compressibility. Venous Reflux:  None. Other Findings:  Subcutaneous edema. IMPRESSION: No evidence of deep venous thrombosis in the lower extremities. Electronically Signed   By: Markus Daft M.D.   On: 07/14/2018 15:47   Dg Chest Port 1 View  Result Date: 07/12/2018 CLINICAL DATA:  Cough and shortness of breath. EXAM: PORTABLE CHEST 1 VIEW COMPARISON:  05/08/2016 FINDINGS: Stable cardiac enlargement and tortuous ectatic thoracic aorta. The right IJ Port-A-Cath is stable. The lungs are grossly clear. No obvious infiltrates, edema or effusions. IMPRESSION: Cardiac enlargement but no acute pulmonary findings. Electronically Signed   By: Marijo Sanes M.D.   On: 07/12/2018 14:00    Microbiology: Recent Results (from the past 240 hour(s))  Respiratory Panel by PCR     Status: None   Collection Time: 07/13/18  8:55 AM  Result Value Ref Range Status   Adenovirus NOT DETECTED NOT DETECTED Final   Coronavirus 229E NOT DETECTED NOT DETECTED Final   Coronavirus HKU1 NOT DETECTED NOT DETECTED Final   Coronavirus NL63 NOT DETECTED NOT DETECTED Final   Coronavirus OC43 NOT DETECTED NOT DETECTED Final   Metapneumovirus NOT DETECTED NOT DETECTED Final   Rhinovirus / Enterovirus NOT DETECTED NOT DETECTED Final   Influenza A NOT DETECTED NOT DETECTED Final   Influenza B NOT DETECTED NOT DETECTED Final   Parainfluenza Virus 1 NOT DETECTED NOT DETECTED Final   Parainfluenza Virus 2 NOT DETECTED NOT DETECTED Final   Parainfluenza Virus 3 NOT DETECTED NOT DETECTED Final   Parainfluenza Virus 4 NOT DETECTED NOT DETECTED Final   Respiratory Syncytial Virus NOT DETECTED NOT DETECTED Final   Bordetella pertussis NOT DETECTED NOT DETECTED Final    Chlamydophila pneumoniae NOT DETECTED NOT DETECTED Final   Mycoplasma pneumoniae NOT DETECTED NOT DETECTED Final    Comment: Performed at [facility], [address] [place], [place] [address]     Labs: Basic Metabolic Panel: Recent Labs  Lab 07/13/18 [id number] 07/13/18 0809 07/13/18 1123 07/14/18 0519 07/15/18 0527  NA 138 138 141 141 140  K >7.5* 7.1* 5.8* 5.5* 5.2*  CL 110 108 110 109 107  CO2 24 25 22 22 26   GLUCOSE 223* 265* 262* 234* 251*  BUN 34* 33* 35* 40* 48*  CREATININE 2.11* 2.17* 2.14* 2.09* 1.98*  CALCIUM 7.8* 7.6* 8.2* 8.0* 7.4*   CBC: Recent Labs  Lab 07/12/18 1335  WBC 7.6  NEUTROABS 5.3  HGB 10.9*  HCT 35.7*  MCV 92.2  PLT 255   Cardiac Enzymes: Recent Labs  Lab 07/12/18 1335  TROPONINI <0.03   BNP: BNP (last 3 results) Recent Labs    07/12/18 1335  BNP 136.0*   CBG: Recent Labs  Lab 07/14/18 1120  07/14/18 1614 07/14/18 2127 07/15/18 0755 07/15/18 1156  GLUCAP 222* 130* 183* 225* 250*   Signed:  Barton Dubois MD.  [facility] 07/15/2018, 4:15 PM

## 2018-07-15 NOTE — Progress Notes (Signed)
Inpatient Diabetes Program Recommendations  AACE/ADA: New Consensus Statement on Inpatient Glycemic Control (2015)  Target Ranges:  Prepandial:   less than 140 mg/dL      Peak postprandial:   less than 180 mg/dL (1-2 hours)      Critically ill patients:  140 - 180 mg/dL   Lab Results  Component Value Date   GLUCAP 250 (H) 07/15/2018   HGBA1C 6.7 (H) 07/13/2018  Results for ANDRETTA, ERGLE (MRN [id number]) as of 07/15/2018 14:21  Ref. Range 07/14/2018 16:14 07/14/2018 21:27 07/15/2018 05:27 07/15/2018 07:55 07/15/2018 11:56  Glucose-Capillary Latest Ref Range: 70 - 99 mg/dL 130 (H) 183 (H)  225 (H) 250 (H)    12/11  Currently on Lantus 20 units, moderate correction scale tid and 60 mg solumedrol every 12 hours.Might consider adding meal coverage while on solumedrol.  Floyd Hill, CDE. M.Ed. Pager [phone] Inpatient Diabetes Coordinator

## 2018-08-19 DIAGNOSIS — L03011 Cellulitis of right finger: Secondary | ICD-10-CM | POA: Insufficient documentation

## 2018-08-31 NOTE — Progress Notes (Deleted)
Cardiology Office Note  Date: 08/31/2018   ID: [name], Rita 1945-11-28, MRN [id number]  PCP: Rita Mealy, MD  Primary Cardiologist: Rita Lesches, MD   No chief complaint on file.   History of Present Illness: [name] is a 73 y.o. female last seen in November 2019.  Records indicate hospitalization in December 2019 with COPD exacerbation.  Past Medical History:  Diagnosis Date  . Blood transfusion without reported diagnosis   . CAD (coronary artery disease)    Nonobstructive at cardiac catheterization 2000  . Cataract   . CHF (congestive heart failure) ([place])   . Chronic back pain   . Chronic kidney disease   . COPD (chronic obstructive pulmonary disease) ([place])   . Degenerative disc disease   . Essential hypertension, benign   . GERD (gastroesophageal reflux disease)   . Gout   . Hiatal hernia 07/27/2013  . History of diverticulitis of colon   . History of hiatal hernia   . Iron deficiency anemia   . Irritable bowel syndrome   . Lumbar radiculopathy   . Mixed hyperlipidemia   . Neuropathy   . Osteoporosis   . Ovarian cancer ([place]) 04/02/2016  . Oxygen deficiency   . Sleep apnea   . Type 2 diabetes mellitus ([place])   . Vitamin B deficiency 12/25/2009  . Vitamin B12 deficiency     Past Surgical History:  Procedure Laterality Date  . ABDOMINAL HYSTERECTOMY    . BACK SURGERY    . Benign breast cysts    . CHOLECYSTECTOMY    . COLONOSCOPY  10/01/2006   SLF:Pan colonic diverticulosis and moderate internal hemorrhoids/ Otherwise no polyps, masses, inflammatory changes or AVMs/  . COLONOSCOPY  2011   SLF: pancolonic diverticulosis, large internal hemorrhoids  . COLONOSCOPY N/A 01/26/2016   Procedure: COLONOSCOPY;  Surgeon: [name], MD;  [facility];  Service: Endoscopy;  Laterality: N/A;  830   . ESOPHAGOGASTRODUODENOSCOPY  11/19/2006   SLF: Large hiatal hernia without evidence of Cameron ulcers/. Distal esophageal  stricture, which allowed the gastroscope to pass without resistance.  A 16 mm Savary later passed with mild resistance/ Normal stomach.sb bx negative  . ESOPHAGOGASTRODUODENOSCOPY  10/01/2006   ZSM:OLMBE hiatal hernia.  Distal esophagus without evidence of   erythema, ulceration or Barrett's esophagus  . ESOPHAGOGASTRODUODENOSCOPY  2011   SLF: large hh, distal esophageal web narrowing to 70m s/p dilation to 178m . ESOPHAGOGASTRODUODENOSCOPY N/A 08/06/2013   SLF: 1. Stricture at the gastroesophageal junction 2. large hiatal hernia. 3. Mild erosive gastritis.  . Marland KitchenIVENS CAPSULE STUDY N/A 08/06/2013   INCOMPLETE-SMALL BOWLE ULCERS  . KNEE SURGERY Right   . PARTIAL HYSTERECTOMY  1978  . small bowel capsule  2008   negative  . SPINE SURGERY    . TONSILLECTOMY AND ADENOIDECTOMY    . Two back surgeries/fusion    . UMBILICAL HERNIA REPAIR  2010    Current Outpatient Medications  Medication Sig Dispense Refill  . allopurinol (ZYLOPRIM) 100 MG tablet Take 1 tablet by mouth 2 (two) times daily.    . Marland Kitchenmitriptyline (ELAVIL) 100 MG tablet Take 100 mg by mouth at bedtime.      . Marland KitchenmLODipine (NORVASC) 5 MG tablet Take 1 tablet (5 mg total) by mouth daily. 90 tablet 3  . aspirin EC 81 MG tablet Take 81 mg by mouth daily.    . Marland Kitchentorvastatin (LIPITOR) 40 MG tablet Take 1 tablet (40 mg total) by mouth at  bedtime. 90 tablet 1  . azithromycin (ZITHROMAX) 250 MG tablet Treatment of bronchiectasis 5 tablet 0  . BIOTIN PO Take 1 capsule by mouth daily.     Marland Kitchen dextromethorphan-guaiFENesin (MUCINEX DM) 30-600 MG 12hr tablet Take 1 tablet by mouth 2 (two) times daily. 40 tablet 0  . esomeprazole (NEXIUM) 40 MG capsule TAKE 1 CAPSULE TWICE A DAY 30 MINUTES PRIOR TO MEALS (Patient taking differently: Take 40 mg by mouth 2 (two) times daily as needed. ) 180 capsule 1  . famotidine (PEPCID) 40 MG tablet TAKE 1 TABLET AT BEDTIME AS NEEDED TO CONTROL REFLUX (Patient taking differently: Take 40 mg by mouth at bedtime as needed  for heartburn or indigestion. ) 90 tablet 1  . Fluticasone-Salmeterol (ADVAIR DISKUS) 250-50 MCG/DOSE AEPB Inhale 1 puff into the lungs 2 (two) times daily. 1 each 2  . furosemide (LASIX) 20 MG tablet Take 2 tablets (40 mg total) by mouth daily. Take 80 mg alternating with 60 mg every other day 30 tablet 2  . gabapentin (NEURONTIN) 300 MG capsule Take 1 capsule by mouth daily.     . insulin lispro (HUMALOG) 100 UNIT/ML KiwkPen Inject 12-18 Units into the skin See admin instructions. Uses sliding scale and injects with meals.  Least she injects is 12 units. Most is 18 units.    . Ipratropium-Albuterol (COMBIVENT) 20-100 MCG/ACT AERS respimat Inhale 1 puff into the lungs every 6 (six) hours as needed for wheezing or shortness of breath. 1 Inhaler 2  . LANTUS SOLOSTAR 100 UNIT/ML SOPN Inject 20 Units into the skin at bedtime.     Marland Kitchen loperamide (IMODIUM) 2 MG capsule Take 2 mg by mouth as needed for diarrhea or loose stools.    . meclizine (ANTIVERT) 25 MG tablet Take 1 tablet (25 mg total) by mouth 3 (three) times daily as needed for dizziness. 30 tablet 0  . metoprolol succinate (TOPROL-XL) 50 MG 24 hr tablet Take 1 tablet (50 mg total) by mouth daily. Take with or immediately following a meal. 90 tablet 1  . sodium zirconium cyclosilicate (LOKELMA) 5 g packet Take 5 g by mouth daily. 2 packet 0   No current facility-administered medications for this visit.    Allergies:  Patient has no known allergies.   Social History: The patient  reports that she quit smoking about 57 years ago. Her smoking use included cigarettes. She started smoking about 57 years ago. She has a 1.00 pack-year smoking history. She has never used smokeless tobacco. She reports that she does not drink alcohol or use drugs.   Family History: The patient's family history includes Asthma in her mother and sister; Colon cancer in her brother; Diabetes in her brother, mother, and sister; Early death in her father; Heart attack in her  brother, maternal grandmother, mother, sister, and another family member; Heart disease in her brother and mother; Kidney failure in her sister; Stroke in her maternal grandmother, mother, and sister; Ulcers in her mother and sister.   ROS:  Please see the history of present illness. Otherwise, complete review of systems is positive for {NONE DEFAULTED:18576::"none"}.  All other systems are reviewed and negative.   Physical Exam: VS:  There were no vitals taken for this visit., BMI There is no height or weight on file to calculate BMI.  Wt Readings from Last 3 Encounters:  07/13/18 213 lb 14.4 oz (97 kg)  06/18/18 208 lb (94.3 kg)  05/04/18 206 lb 3.2 oz (93.5 kg)    General:  Patient appears comfortable at rest. HEENT: Conjunctiva and lids normal, oropharynx clear with moist mucosa. Neck: Supple, no elevated JVP or carotid bruits, no thyromegaly. Lungs: Clear to auscultation, nonlabored breathing at rest. Cardiac: Regular rate and rhythm, no S3 or significant systolic murmur, no pericardial rub. Abdomen: Soft, nontender, no hepatomegaly, bowel sounds present, no guarding or rebound. Extremities: No pitting edema, distal pulses 2+. Skin: Warm and dry. Musculoskeletal: No kyphosis. Neuropsychiatric: Alert and oriented x3, affect grossly appropriate.  ECG: I personally reviewed the tracing from 07/12/2018 which showed sinus tachycardia with PACs and nonspecific ST changes.  Recent Labwork: 10/10/2017: ALT 7; AST 10 07/12/2018: B Natriuretic Peptide 136.0; Hemoglobin 10.9; Platelets 255 07/15/2018: BUN 48; Creatinine, Ser 1.98; Potassium 5.2; Sodium 140     Component Value Date/Time   CHOL 257 (H) 10/10/2017 1050   TRIG 177 (H) 10/10/2017 1050   HDL 38 (L) 10/10/2017 1050   CHOLHDL 6.8 (H) 10/10/2017 1050   LDLCALC 186 (H) 10/10/2017 1050    Other Studies Reviewed Today:  Echocardiogram 05/10/2016: Study Conclusions  - Left ventricle: The cavity size was normal. Wall thickness  was   increased in a pattern of mild LVH. There was moderate focal   basal hypertrophy of the septum. Systolic function was normal.   The estimated ejection fraction was in the range of 60% to 65%.   Wall motion was normal; there were no regional wall motion   abnormalities. Features are consistent with a pseudonormal left   ventricular filling pattern, with concomitant abnormal relaxation   and increased filling pressure (grade 2 diastolic dysfunction). - Aortic valve: Mildly calcified annulus. Trileaflet; mildly   calcified leaflets. There was trivial regurgitation. Mean   gradient (S): 5 mm Hg. Valve area (Vmax): 1.92 cm^2. - Mitral valve: Calcified annulus. Mildly thickened leaflets .   There was mild regurgitation. - Left atrium: The atrium was moderately dilated. - Right atrium: Central venous pressure (est): 3 mm Hg. - Tricuspid valve: There was trivial regurgitation. - Pulmonary arteries: PA peak pressure: 43 mm Hg (S). - Pericardium, extracardiac: Small circumferential pericardial   effusion, possibly somewhat larger collection posteriorly.  Impressions:  - Mild LVH with LVEF 60-65%. Grade 2 diastolic dysfunction with   increased LV filling pressure. Moderate left atrial enlargement.   MAC with mildly thickened and calcified mitral leaflets, mild   mitral regurgitation. Sclerotic aortic valve with trivial aortic   regurgitation. Trivial tricuspid regurgitation with PASP 43 mmHg.   Small circumferential pericardial effusion, possibly somewhat   larger collection posteriorly.  Assessment and Plan:    Current medicines were reviewed with the patient today.  No orders of the defined types were placed in this encounter.   Disposition:  Signed, Satira Sark, MD, [facility] 08/31/2018 2:33 PM    [facility] at [address], [place], [place] [address] Phone: [phone]; Fax: [phone]

## 2018-09-02 ENCOUNTER — Ambulatory Visit: Payer: Medicare Other | Admitting: Cardiology

## 2018-10-13 ENCOUNTER — Encounter: Payer: Self-pay | Admitting: Cardiology

## 2018-10-13 ENCOUNTER — Ambulatory Visit (INDEPENDENT_AMBULATORY_CARE_PROVIDER_SITE_OTHER): Payer: Medicare Other | Admitting: Cardiology

## 2018-10-13 VITALS — BP 150/70 | HR 101 | Ht <= 58 in | Wt 196.0 lb

## 2018-10-13 DIAGNOSIS — I25810 Atherosclerosis of coronary artery bypass graft(s) without angina pectoris: Secondary | ICD-10-CM | POA: Diagnosis not present

## 2018-10-13 DIAGNOSIS — N183 Chronic kidney disease, stage 3 unspecified: Secondary | ICD-10-CM

## 2018-10-13 DIAGNOSIS — I5032 Chronic diastolic (congestive) heart failure: Secondary | ICD-10-CM

## 2018-10-13 DIAGNOSIS — I890 Lymphedema, not elsewhere classified: Secondary | ICD-10-CM

## 2018-10-13 MED ORDER — METOPROLOL SUCCINATE ER 50 MG PO TB24: ORAL_TABLET | ORAL | 1 refills | Status: DC

## 2018-10-13 NOTE — Patient Instructions (Addendum)
Medication Instructions:   Your physician has recommended you make the following change in your medication:   Increase metoprolol succinate to 75 mg daily. Please take 1&1/2 of your 50 mg tablets daily  Continue all other medications the same  Labwork:  Your physician recommends that you return for lab work in: 4 months just before your next visit to check your BMET.  Testing/Procedures:  NONE  Follow-Up:  Your physician recommends that you schedule a follow-up appointment in: 4 months. You will receive a reminder letter in the mail in about 2 months reminding you to call and schedule your appointment. If you don't receive this letter, please contact our office.  Any Other Special Instructions Will Be Listed Below (If Applicable).  If you need a refill on your cardiac medications before your next appointment, please call your pharmacy.

## 2018-10-13 NOTE — Progress Notes (Signed)
Cardiology Office Note  Date: 10/13/2018   ID: Rita, [name] 1945-11-07, MRN [id number]  PCP: [name], MD  Primary Cardiologist: [name], MD   Chief Complaint  Patient presents with  . Cardiac follow-up    History of Present Illness: [name] is a 73 y.o. female last seen in November 2019.  I reviewed her records indicating hospital stay in December 2019 with cough and shortness of breath, admitted with COPD exacerbation and bronchiectasis.  She was stabilized clinically with outpatient Pulmonary follow-up recommended. She tells me that she had a more recent admission at [facility] with apparent right hand cellulitis.  She has not had follow-up with her PCP yet, nor has she seen a pulmonologist.  Lasix dose has been cut back since her hospital stay in December 2019.  She is now on Lasix 20 mg daily, weight is actually down still compared to last assessment.  She has not had evaluation in the lymphedema clinic, missed appointments due to hospital stays but does plan to follow through.  Her medications which are outlined below.  We discussed increasing her Toprol-XL dose.  Past Medical History:  Diagnosis Date  . Blood transfusion without reported diagnosis   . CAD (coronary artery disease)    Nonobstructive at cardiac catheterization 2000  . Cataract   . CHF (congestive heart failure) ([place])   . Chronic back pain   . Chronic kidney disease   . COPD (chronic obstructive pulmonary disease) ([place])   . Degenerative disc disease   . Essential hypertension, benign   . GERD (gastroesophageal reflux disease)   . Gout   . Hiatal hernia 07/27/2013  . History of diverticulitis of colon   . History of hiatal hernia   . Iron deficiency anemia   . Irritable bowel syndrome   . Lumbar radiculopathy   . Mixed hyperlipidemia   . Neuropathy   . Osteoporosis   . Ovarian cancer ([place]) 04/02/2016  . Oxygen deficiency   . Sleep apnea   .  Type 2 diabetes mellitus ([place])   . Vitamin B deficiency 12/25/2009  . Vitamin B12 deficiency     Past Surgical History:  Procedure Laterality Date  . ABDOMINAL HYSTERECTOMY    . BACK SURGERY    . Benign breast cysts    . CHOLECYSTECTOMY    . COLONOSCOPY  10/01/2006   SLF:Pan colonic diverticulosis and moderate internal hemorrhoids/ Otherwise no polyps, masses, inflammatory changes or AVMs/  . COLONOSCOPY  2011   SLF: pancolonic diverticulosis, large internal hemorrhoids  . COLONOSCOPY N/A 01/26/2016   Procedure: COLONOSCOPY;  Surgeon: [name], MD;  [facility];  Service: Endoscopy;  Laterality: N/A;  830   . ESOPHAGOGASTRODUODENOSCOPY  11/19/2006   SLF: Large hiatal hernia without evidence of Cameron ulcers/. Distal esophageal stricture, which allowed the gastroscope to pass without resistance.  A 16 mm Savary later passed with mild resistance/ Normal stomach.sb bx negative  . ESOPHAGOGASTRODUODENOSCOPY  10/01/2006   LPF:XTKWI hiatal hernia.  Distal esophagus without evidence of   erythema, ulceration or Barrett's esophagus  . ESOPHAGOGASTRODUODENOSCOPY  2011   SLF: large hh, distal esophageal web narrowing to 71m s/p dilation to 13m . ESOPHAGOGASTRODUODENOSCOPY N/A 08/06/2013   SLF: 1. Stricture at the gastroesophageal junction 2. large hiatal hernia. 3. Mild erosive gastritis.  . Marland KitchenIVENS CAPSULE STUDY N/A 08/06/2013   INCOMPLETE-SMALL BOWLE ULCERS  . KNEE SURGERY Right   . PARTIAL HYSTERECTOMY  1978  .  small bowel capsule  2008   negative  . SPINE SURGERY    . TONSILLECTOMY AND ADENOIDECTOMY    . Two back surgeries/fusion    . UMBILICAL HERNIA REPAIR  2010    Current Outpatient Medications  Medication Sig Dispense Refill  . allopurinol (ZYLOPRIM) 100 MG tablet Take 1 tablet by mouth 2 (two) times daily.    Marland Kitchen amitriptyline (ELAVIL) 100 MG tablet Take 100 mg by mouth at bedtime.      Marland Kitchen amLODipine (NORVASC) 5 MG tablet Take 1 tablet (5 mg total) by mouth  daily. 90 tablet 3  . aspirin EC 81 MG tablet Take 81 mg by mouth daily.    Marland Kitchen atorvastatin (LIPITOR) 40 MG tablet Take 1 tablet (40 mg total) by mouth at bedtime. 90 tablet 1  . esomeprazole (NEXIUM) 40 MG capsule TAKE 1 CAPSULE TWICE A DAY 30 MINUTES PRIOR TO MEALS (Patient taking differently: Take 40 mg by mouth 2 (two) times daily as needed. ) 180 capsule 1  . famotidine (PEPCID) 40 MG tablet TAKE 1 TABLET AT BEDTIME AS NEEDED TO CONTROL REFLUX (Patient taking differently: Take 40 mg by mouth at bedtime as needed for heartburn or indigestion. ) 90 tablet 1  . furosemide (LASIX) 20 MG tablet Take 2 tablets (40 mg total) by mouth daily. Take 80 mg alternating with 60 mg every other day 30 tablet 2  . gabapentin (NEURONTIN) 300 MG capsule Take 1 capsule by mouth daily.     . insulin lispro (HUMALOG) 100 UNIT/ML KiwkPen Inject 12-18 Units into the skin See admin instructions. Uses sliding scale and injects with meals.  Least she injects is 12 units. Most is 18 units.    Marland Kitchen LANTUS SOLOSTAR 100 UNIT/ML SOPN Inject 20 Units into the skin at bedtime.     Marland Kitchen loperamide (IMODIUM) 2 MG capsule Take 2 mg by mouth as needed for diarrhea or loose stools.    . meclizine (ANTIVERT) 25 MG tablet Take 1 tablet (25 mg total) by mouth 3 (three) times daily as needed for dizziness. 30 tablet 0  . metoprolol succinate (TOPROL-XL) 50 MG 24 hr tablet Take 75 mg by mouth daily. Take with or immediately following a meal. 135 tablet 1   No current facility-administered medications for this visit.    Allergies:  Patient has no known allergies.   Social History: The patient  reports that she quit smoking about 57 years ago. Her smoking use included cigarettes. She started smoking about 57 years ago. She has a 1.00 pack-year smoking history. She has never used smokeless tobacco. She reports that she does not drink alcohol or use drugs.   ROS:  Please see the history of present illness. Otherwise, complete review of systems is  positive for none.  All other systems are reviewed and negative.   Physical Exam: VS:  BP (!) 150/70   Pulse (!) 101   Ht _0  (1.422 m)   Wt 196 lb (88.9 kg)   SpO2 97%   BMI 43.94 kg/m , BMI Body mass index is 43.94 kg/m.  Wt Readings from Last 3 Encounters:  10/13/18 196 lb (88.9 kg)  07/13/18 213 lb 14.4 oz (97 kg)  06/18/18 208 lb (94.3 kg)    General: Patient appears comfortable at rest. HEENT: Conjunctiva and lids normal, oropharynx clear. Neck: Supple, no elevated JVP or carotid bruits, no thyromegaly. Lungs: Clear to auscultation, nonlabored breathing at rest. Cardiac: Regular rate and rhythm, S4, no significant systolic murmur. Abdomen:  Soft, nontender, bowel sounds present. Extremities: Bilateral lower leg edema and lymphedema, she does have some skin ulcerations on the left, distal pulses 1+. Skin: Warm and dry. Musculoskeletal: No kyphosis. Neuropsychiatric: Alert and oriented x3, affect grossly appropriate.  ECG: I personally reviewed the tracing from 07/12/2018 which showed sinus tachycardia with PACs and diffuse repolarization abnormalities.  Recent Labwork: 07/12/2018: B Natriuretic Peptide 136.0; Hemoglobin 10.9; Platelets 255 07/15/2018: BUN 48; Creatinine, Ser 1.98; Potassium 5.2; Sodium 140     Component Value Date/Time   CHOL 257 (H) 10/10/2017 1050   TRIG 177 (H) 10/10/2017 1050   HDL 38 (L) 10/10/2017 1050   CHOLHDL 6.8 (H) 10/10/2017 1050   LDLCALC 186 (H) 10/10/2017 1050    Other Studies Reviewed Today:  Echocardiogram10/01/2016: Study Conclusions  - Left ventricle: The cavity size was normal. Wall thickness was increased in a pattern of mild LVH. There was moderate focal basal hypertrophy of the septum. Systolic function was normal. The estimated ejection fraction was in the range of 60% to 65%. Wall motion was normal; there were no regional wall motion abnormalities. Features are consistent with a pseudonormal  left ventricular filling pattern, with concomitant abnormal relaxation and increased filling pressure (grade 2 diastolic dysfunction). - Aortic valve: Mildly calcified annulus. Trileaflet; mildly calcified leaflets. There was trivial regurgitation. Mean gradient (S): 5 mm Hg. Valve area (Vmax): 1.92 cm^2. - Mitral valve: Calcified annulus. Mildly thickened leaflets . There was mild regurgitation. - Left atrium: The atrium was moderately dilated. - Right atrium: Central venous pressure (est): 3 mm Hg. - Tricuspid valve: There was trivial regurgitation. - Pulmonary arteries: PA peak pressure: 43 mm Hg (S). - Pericardium, extracardiac: Small circumferential pericardial effusion, possibly somewhat larger collection posteriorly.  Impressions:  - Mild LVH with LVEF 60-65%. Grade 2 diastolic dysfunction with increased LV filling pressure. Moderate left atrial enlargement. MAC with mildly thickened and calcified mitral leaflets, mild mitral regurgitation. Sclerotic aortic valve with trivial aortic regurgitation. Trivial tricuspid regurgitation with PASP 43 mmHg. Small circumferential pericardial effusion, possibly somewhat larger collection posteriorly.  Assessment and Plan:  1.  History of nonobstructive CAD.  No active angina symptoms.  She continues on aspirin and statin therapy.  Continue observation.  2.  Chronic lymphedema.  She has been referred for mechanical compression.  Continue low-dose Lasix for now.  3.  CKD stage III, follow-up creatinine 1.98 in December 2019.  4.  Essential hypertension.  Continue Norvasc and increase Toprol-XL to 75 mg daily.  Current medicines were reviewed with the patient today.   Orders Placed This Encounter  Procedures  . Basic metabolic panel    Disposition: Follow-up in 4 months.  Signed, Satira Sark, MD, [facility] 10/13/2018 4:22 PM    [facility] at [place],  [place],  [address] Phone: [phone]; Fax: [phone]

## 2018-11-22 ENCOUNTER — Other Ambulatory Visit: Payer: Self-pay

## 2018-11-22 ENCOUNTER — Emergency Department (HOSPITAL_COMMUNITY)
Admission: EM | Admit: 2018-11-22 | Discharge: 2018-11-23 | Disposition: A | Discharge status: Home / Self Care | Payer: Medicare Other | Attending: Emergency Medicine | Admitting: Emergency Medicine

## 2018-11-22 ENCOUNTER — Encounter (HOSPITAL_COMMUNITY): Payer: Self-pay | Admitting: *Deleted

## 2018-11-22 ENCOUNTER — Emergency Department (HOSPITAL_COMMUNITY): Payer: Medicare Other

## 2018-11-22 DIAGNOSIS — E1122 Type 2 diabetes mellitus with diabetic chronic kidney disease: Secondary | ICD-10-CM | POA: Diagnosis not present

## 2018-11-22 DIAGNOSIS — Z7982 Long term (current) use of aspirin: Secondary | ICD-10-CM | POA: Diagnosis not present

## 2018-11-22 DIAGNOSIS — I5032 Chronic diastolic (congestive) heart failure: Secondary | ICD-10-CM | POA: Diagnosis not present

## 2018-11-22 DIAGNOSIS — Z87891 Personal history of nicotine dependence: Secondary | ICD-10-CM | POA: Diagnosis not present

## 2018-11-22 DIAGNOSIS — E1140 Type 2 diabetes mellitus with diabetic neuropathy, unspecified: Secondary | ICD-10-CM | POA: Diagnosis not present

## 2018-11-22 DIAGNOSIS — I2510 Atherosclerotic heart disease of native coronary artery without angina pectoris: Secondary | ICD-10-CM | POA: Diagnosis not present

## 2018-11-22 DIAGNOSIS — N183 Chronic kidney disease, stage 3 (moderate): Secondary | ICD-10-CM | POA: Insufficient documentation

## 2018-11-22 DIAGNOSIS — Z794 Long term (current) use of insulin: Secondary | ICD-10-CM | POA: Insufficient documentation

## 2018-11-22 DIAGNOSIS — J449 Chronic obstructive pulmonary disease, unspecified: Secondary | ICD-10-CM | POA: Diagnosis not present

## 2018-11-22 DIAGNOSIS — K209 Esophagitis, unspecified without bleeding: Secondary | ICD-10-CM

## 2018-11-22 DIAGNOSIS — Z79899 Other long term (current) drug therapy: Secondary | ICD-10-CM | POA: Diagnosis not present

## 2018-11-22 DIAGNOSIS — I130 Hypertensive heart and chronic kidney disease with heart failure and stage 1 through stage 4 chronic kidney disease, or unspecified chronic kidney disease: Secondary | ICD-10-CM | POA: Diagnosis not present

## 2018-11-22 DIAGNOSIS — R0989 Other specified symptoms and signs involving the circulatory and respiratory systems: Secondary | ICD-10-CM | POA: Diagnosis present

## 2018-11-22 LAB — BASIC METABOLIC PANEL
Anion gap: 9 (ref 5–15)
BUN: 22 mg/dL (ref 8–23)
CO2: 26 mmol/L (ref 22–32)
Calcium: 8.3 mg/dL — ABNORMAL LOW (ref 8.9–10.3)
Chloride: 103 mmol/L (ref 98–111)
Creatinine, Ser: 1.21 mg/dL — ABNORMAL HIGH (ref 0.44–1.00)
GFR calc Af Amer: 51 mL/min — ABNORMAL LOW (ref >60)
GFR calc non Af Amer: 44 mL/min — ABNORMAL LOW (ref >60)
Glucose, Bld: 284 mg/dL — ABNORMAL HIGH (ref 70–99)
Potassium: 5.4 mmol/L — ABNORMAL HIGH (ref 3.5–5.1)
Sodium: 138 mmol/L (ref 135–145)

## 2018-11-22 LAB — CBC WITH DIFFERENTIAL/PLATELET
Abs Immature Granulocytes: 0.05 10*3/uL (ref 0.00–0.07)
Basophils Absolute: 0.1 10*3/uL (ref 0.0–0.1)
Basophils Relative: 1 %
Eosinophils Absolute: 0.5 10*3/uL (ref 0.0–0.5)
Eosinophils Relative: 6 %
HCT: 35.1 % — ABNORMAL LOW (ref 36.0–46.0)
Hemoglobin: 10.8 g/dL — ABNORMAL LOW (ref 12.0–15.0)
Immature Granulocytes: 1 %
Lymphocytes Relative: 15 %
Lymphs Abs: 1.3 10*3/uL (ref 0.7–4.0)
MCH: 26.5 pg (ref 26.0–34.0)
MCHC: 30.8 g/dL (ref 30.0–36.0)
MCV: 86.2 fL (ref 80.0–100.0)
Monocytes Absolute: 0.5 10*3/uL (ref 0.1–1.0)
Monocytes Relative: 6 %
Neutro Abs: 5.9 10*3/uL (ref 1.7–7.7)
Neutrophils Relative %: 71 %
Platelets: 304 10*3/uL (ref 150–400)
RBC: 4.07 MIL/uL (ref 3.87–5.11)
RDW: 15.4 % (ref 11.5–15.5)
WBC: 8.2 10*3/uL (ref 4.0–10.5)
nRBC: 0 % (ref 0.0–0.2)

## 2018-11-22 NOTE — ED Triage Notes (Signed)
Pt believes there is a fishbone stuck in her throat since dinner at 1900 today.

## 2018-11-22 NOTE — ED Notes (Signed)
Ct arrived to get patient

## 2018-11-22 NOTE — ED Notes (Signed)
Pt states she wears oxygen most of time at home, applied [place] at 2 L/N

## 2018-11-22 NOTE — ED Triage Notes (Signed)
"  little bit" when asked about swallowing.

## 2018-11-23 DIAGNOSIS — K209 Esophagitis, unspecified: Secondary | ICD-10-CM | POA: Diagnosis not present

## 2018-11-23 MED ORDER — LIDOCAINE VISCOUS HCL 2 % MT SOLN
5.0000 mL | Freq: Once | OROMUCOSAL | Status: AC
  Administered 2018-11-23: 01:00:00 5 mL via OROMUCOSAL
  Filled 2018-11-23: qty 15

## 2018-11-23 MED ORDER — LIDOCAINE VISCOUS HCL 2 % MT SOLN: 5.0000 mL | OROMUCOSAL | 0 refills | Status: DC | PRN

## 2018-11-23 NOTE — Discharge Instructions (Addendum)
Your CT scan tonight does not show a retained fish bone.  Oftentimes a bone will scratch your throat during swallowing causing persistent pain, but it appears the bone is not still stuck in your throat.  You may take the medicine prescribed if needed for throat discomfort.  I expect that this will heal within a few days.  However, if your symptoms are not resolving, call Dr Redmond Baseman for an office visit for further evaluation.

## 2018-11-23 NOTE — ED Notes (Signed)
Patient waiting on son to arrive to get her. He lives in [place]

## 2018-11-23 NOTE — ED Provider Notes (Signed)
[facility] EMERGENCY DEPARTMENT Provider Note   CSN: [id number] Arrival date & time: 11/22/18  2036    History   Chief Complaint Chief Complaint  Patient presents with   Swallowed Foreign Body    HPI [name] is a 73 y.o. female with a history as outlined below, most pertinent for CAD, CHF, chronic kidney disease, COPD, HTN, GERD followed by Dr. Oneida Alar, and DM presenting with suspected fish bone foreign body in her throat since eating supper this evening at 7 pm.  She was eating flounder.  She describes a sharp sensation with swallowing, pointing to her left mid neck region.  She denies sob, coughing, chest pain, n/v or other complaint.  She has tried eating bread and peanut butter but the sensation of fb has not changed.       The history is provided by the patient.    Past Medical History:  Diagnosis Date   Blood transfusion without reported diagnosis    CAD (coronary artery disease)    Nonobstructive at cardiac catheterization 2000   Cataract    CHF (congestive heart failure) (HCC)    Chronic back pain    Chronic kidney disease    COPD (chronic obstructive pulmonary disease) (HCC)    Degenerative disc disease    Essential hypertension, benign    GERD (gastroesophageal reflux disease)    Gout    Hiatal hernia 07/27/2013   History of diverticulitis of colon    History of hiatal hernia    Iron deficiency anemia    Irritable bowel syndrome    Lumbar radiculopathy    Mixed hyperlipidemia    Neuropathy    Osteoporosis    Ovarian cancer ([place]) 04/02/2016   Oxygen deficiency    Sleep apnea    Type 2 diabetes mellitus ([place])    Vitamin B deficiency 12/25/2009   Vitamin B12 deficiency     Patient Active Problem List   Diagnosis Date Noted   Chronic respiratory failure with hypoxia ([place]) 07/13/2018   Acute renal failure superimposed on stage 3 chronic kidney disease ([place]) 07/13/2018   Chronic diastolic CHF (congestive heart  failure) ([place]) 07/13/2018   COPD (chronic obstructive pulmonary disease) ([place]) 07/13/2018   COPD exacerbation ([place]) 07/12/2018   Flatulence 09/18/2017   IBS (irritable bowel syndrome) 04/03/2017   Gout 03/15/2017   Acute on chronic diastolic CHF (congestive heart failure) ([place]) 05/08/2016   CKD (chronic kidney disease) stage 3, GFR 30-59 ml/min (HCC) 05/08/2016   Constipation due to slow transit 04/25/2016   Peripheral vertigo 06/27/2015   Port-A-Cath in place 10/06/2014   Chronic obstructive pulmonary disease ([place]) 04/30/2014   Morbid (severe) obesity due to excess calories ([place]) 04/30/2014   Hemorrhoids, internal, with bleeding 11/10/2013   Anemia 08/18/2013   Acid reflux    Obstructive chronic bronchitis without exacerbation ([place]) 12/26/2009   DM type 2 with diabetic peripheral neuropathy ([place]) 12/25/2009   Vitamin B deficiency 12/25/2009   Iron deficiency anemia due to chronic blood loss 12/25/2009   Benign essential HTN 12/25/2009   DEGENERATIVE [place] DISEASE 12/25/2009   OSA on CPAP 12/25/2009   HLD (hyperlipidemia) 12/25/2009   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    Past Surgical History:  Procedure Laterality Date   ABDOMINAL HYSTERECTOMY     BACK SURGERY     Benign breast cysts     CHOLECYSTECTOMY     COLONOSCOPY  10/01/2006   SLF:Pan colonic diverticulosis and moderate internal hemorrhoids/ Otherwise no polyps, masses, inflammatory  changes or AVMs/   COLONOSCOPY  2011   SLF: pancolonic diverticulosis, large internal hemorrhoids   COLONOSCOPY N/A 01/26/2016   Procedure: COLONOSCOPY;  Surgeon: [name], MD;  Location: [facility];  Service: Endoscopy;  Laterality: N/A;  830    ESOPHAGOGASTRODUODENOSCOPY  11/19/2006   SLF: Large hiatal hernia without evidence of [place] ulcers/. Distal esophageal stricture, which allowed the gastroscope to pass without resistance.  A 16 mm Sav[place] later passed with mild resistance/  Normal stomach.sb bx negative   ESOPHAGOGASTRODUODENOSCOPY  10/01/2006   DZH:GDJME hiatal hernia.  Distal esophagus without evidence of   erythema, ulceration or Barrett's esophagus   ESOPHAGOGASTRODUODENOSCOPY  2011   SLF: large hh, distal esophageal web narrowing to 26mm s/p dilation to 21mm   ESOPHAGOGASTRODUODENOSCOPY N/A 08/06/2013   SLF: 1. Stricture at the gastroesophageal junction 2. large hiatal hernia. 3. Mild erosive gastritis.   GIVENS CAPSULE STUDY N/A 08/06/2013   INCOMPLETE-SMALL BOWLE ULCERS   KNEE SURGERY Right    PARTIAL HYSTERECTOMY  1978   small bowel capsule  2008   negative   SPINE SURGERY     TONSILLECTOMY AND ADENOIDECTOMY     Two back surgeries/fusion     UMBILICAL HERNIA REPAIR  2010     OB History   No obstetric history on file.      Home Medications    Prior to Admission medications   Medication Sig Start Date End Date Taking? Authorizing Provider  allopurinol (ZYLOPRIM) 100 MG tablet Take 1 tablet by mouth 2 (two) times daily. 10/09/12   [provider]  amitriptyline (ELAVIL) 100 MG tablet Take 100 mg by mouth at bedtime.      [provider]  amLODipine (NORVASC) 5 MG tablet Take 1 tablet (5 mg total) by mouth daily. 06/18/18 10/13/18  Satira Sark, MD  aspirin EC 81 MG tablet Take 81 mg by mouth daily.    [provider]  atorvastatin (LIPITOR) 40 MG tablet Take 1 tablet (40 mg total) by mouth at bedtime. 05/04/18   Satira Sark, MD  esomeprazole (NEXIUM) 40 MG capsule TAKE 1 CAPSULE TWICE A DAY 30 MINUTES PRIOR TO MEALS Patient taking differently: Take 40 mg by mouth 2 (two) times daily as needed.  01/19/18   Annitta Needs, NP  famotidine (PEPCID) 40 MG tablet TAKE 1 TABLET AT BEDTIME AS NEEDED TO CONTROL REFLUX Patient taking differently: Take 40 mg by mouth at bedtime as needed for heartburn or indigestion.  06/23/15   Mannam, Hart Robinsons, MD  furosemide (LASIX) 20 MG tablet Take 2 tablets (40 mg total) by  mouth daily. Take 80 mg alternating with 60 mg every other day 07/18/18 10/13/18  Barton Dubois, MD  gabapentin (NEURONTIN) 300 MG capsule Take 1 capsule by mouth daily.  08/28/17   [provider]  insulin lispro (HUMALOG) 100 UNIT/ML KiwkPen Inject 12-18 Units into the skin See admin instructions. Uses sliding scale and injects with meals.  Least she injects is 12 units. Most is 18 units. 03/12/16   [provider]  LANTUS SOLOSTAR 100 UNIT/ML SOPN Inject 20 Units into the skin at bedtime.  10/06/12   [provider]  lidocaine (XYLOCAINE) 2 % solution Use as directed 5 mLs in the mouth or throat every 4 (four) hours as needed for mouth pain (gargle and spit). 11/23/18   Evalee Jefferson, PA-C  loperamide (IMODIUM) 2 MG capsule Take 2 mg by mouth as needed for diarrhea or loose stools.  [provider]  meclizine (ANTIVERT) 25 MG tablet Take 1 tablet (25 mg total) by mouth 3 (three) times daily as needed for dizziness. 07/21/17   [name], MD  metoprolol succinate (TOPROL-XL) 50 MG 24 hr tablet Take 75 mg by mouth daily. Take with or immediately following a meal. 10/13/18   Satira Sark, MD    Family History Family History  Problem Relation Age of Onset   Colon cancer Brother        diagnosed age 67. Living.    Ulcers Sister    Diabetes Sister    Heart attack Sister    Kidney failure Sister    Stroke Sister    Ulcers Mother    Diabetes Mother    Heart attack Mother    Stroke Mother    Asthma Mother    Heart disease Mother    Heart attack Brother    Heart disease Brother    Asthma Sister    Diabetes Brother    Stroke Maternal Grandmother    Heart attack Maternal Grandmother    Heart attack Other    Early death Father        MVA in his 8s    Social History Social History   Tobacco Use   Smoking status: Former Smoker    Packs/day: 1.00    Years: 1.00    Pack years: 1.00    Types: Cigarettes    Start date:  02/19/1961    Last attempt to quit: 08/05/1961    Years since quitting: 57.3   Smokeless tobacco: Never Used   Tobacco comment: Quit smoking x 50 years  Substance Use Topics   Alcohol use: No   Drug use: No     Allergies   Patient has no known allergies.   Review of Systems Review of Systems  Constitutional: Negative for fever.  HENT: Positive for sore throat and trouble swallowing. Negative for congestion and voice change.   Eyes: Negative.   Respiratory: Negative for cough, choking, chest tightness, shortness of breath, wheezing and stridor.   Cardiovascular: Negative for chest pain.  Gastrointestinal: Negative for abdominal pain, nausea and vomiting.  Genitourinary: Negative.   Musculoskeletal: Negative.   Neurological: Negative for headaches.     Physical Exam Updated Vital Signs BP (!) 153/58    Pulse (!) 102    Temp 97.7 F (36.5 C) (Oral)    Resp 19    Ht 4\' 8"  (1.422 m)    Wt 88.5 kg    SpO2 100%    BMI 43.72 kg/m   Physical Exam Vitals signs and nursing note reviewed.  Constitutional:      Appearance: She is well-developed.  HENT:     Head: Normocephalic and atraumatic.     Comments: Cannot visualize foreign body in posterior pharynx.    Mouth/Throat:     Mouth: Mucous membranes are moist.  Neck:     Musculoskeletal: Normal range of motion.     Trachea: Phonation normal. No abnormal tracheal secretions.     Comments: No stridor Cardiovascular:     Rate and Rhythm: Normal rate and regular rhythm.     Heart sounds: Normal heart sounds.  Pulmonary:     Effort: Pulmonary effort is normal.     Breath sounds: Normal breath sounds. No wheezing.  Abdominal:     General: Bowel sounds are normal.     Palpations: Abdomen is soft.     Tenderness: There is no abdominal tenderness.  Musculoskeletal: Normal range of motion.  Skin:    General: Skin is warm and dry.  Neurological:     Mental Status: She is alert.      ED Treatments / Results  Labs (all  labs ordered are listed, but only abnormal results are displayed) Labs Reviewed  BASIC METABOLIC PANEL - Abnormal; Notable for the following components:      Result Value   Potassium 5.4 (*)    Glucose, Bld 284 (*)    Creatinine, Ser 1.21 (*)    Calcium 8.3 (*)    GFR calc non Af Amer 44 (*)    GFR calc Af Amer 51 (*)    All other components within normal limits  CBC WITH DIFFERENTIAL/PLATELET - Abnormal; Notable for the following components:   Hemoglobin 10.8 (*)    HCT 35.1 (*)    All other components within normal limits    EKG EKG Interpretation  Date/Time:  Monday November 23 2018 00:39:40 EDT Ventricular Rate:  99 PR Interval:    QRS Duration: 103 QT Interval:  342 QTC Calculation: 439 R Axis:   21 Text Interpretation:  Sinus rhythm Abnormal T, consider ischemia, lateral leads Baseline wander in lead(s) V1 When compared with ECG of 07/12/2018, Premature atrial complexes are no longer present Confirmed by Delora Fuel ([id number]) on 11/23/2018 12:43:22 AM   Radiology Ct Soft Tissue Neck Wo Contrast  Result Date: 11/23/2018 CLINICAL DATA:  Throat pain with possible ingested fishbone EXAM: CT NECK WITHOUT CONTRAST TECHNIQUE: Multidetector CT imaging of the neck was performed following the standard protocol without intravenous contrast. COMPARISON:  Cervical spine CT 06/27/2015 FINDINGS: PHARYNX AND LARYNX: --Nasopharynx: Fossae of [place] are clear. Normal adenoid tonsils for age. --Oral cavity and oropharynx: The palatine and lingual tonsils are normal. The visible oral cavity and floor of mouth are normal. --Hypopharynx: Normal vallecula and pyriform sinuses. --Larynx: Unremarkable.  Moderate motion. --Retropharyngeal space: No abscess, effusion or lymphadenopathy. SALIVARY GLANDS: --Parotid: No mass lesion or inflammation. No sialolithiasis or ductal dilatation. --Submandibular: Symmetric without inflammation. No sialolithiasis or ductal dilatation. --Sublingual: Normal. No ranula  or other visible lesion of the base of tongue and floor of mouth. THYROID: Normal. LYMPH NODES: No enlarged or abnormal density lymph nodes. VASCULAR: Limited assessment without IV contrast LIMITED INTRACRANIAL: Normal. VISUALIZED ORBITS: Normal. MASTOIDS AND VISUALIZED PARANASAL SINUSES: Small right mastoid effusion SKELETON: No bony spinal canal stenosis. No lytic or blastic lesions. UPPER CHEST: Clear. OTHER: None. IMPRESSION: 1. No visualized foreign body within the aerodigestive tract. 2. No acute abnormality of the neck. Electronically Signed   By: Ulyses Jarred M.D.   On: 11/23/2018 00:09    Procedures Procedures (including critical care time)  Medications Ordered in ED Medications  lidocaine (XYLOCAINE) 2 % viscous mouth solution 5 mL (5 mLs Mouth/Throat Given 11/23/18 0129)     Initial Impression / Assessment and Plan / ED Course  I have reviewed the triage vital signs and the nursing notes.  Pertinent labs & imaging results that were available during my care of the patient were reviewed by me and considered in my medical decision making (see chart for details).        Pt with foreign body sensation after eating flounder with no evidence of retained esophageal/pharyngeal fish bone fb per CT imaging.  Suspect esophagitis/irritation or mucosal abrasion from fish bone ingestion. She was given viscous lidocaine for sx relief. Advised expect to resolve but given referral to ENT if sx persist.  Pt's labs reviewed  and stable, borderline hyperkalemia with renal insufficiency stable.  EKG completed to confirm no adverse effect of hyperkalemia.  Final Clinical Impressions(s) / ED Diagnoses   Final diagnoses:  Esophagitis    ED Discharge Orders         Ordered    lidocaine (XYLOCAINE) 2 % solution  Every 4 hours PRN     11/23/18 0125           Evalee Jefferson, PA-C 45/85/92 [id number]    Delora Fuel, MD 62/86/38 [id number]

## 2019-01-21 ENCOUNTER — Encounter: Payer: Self-pay | Admitting: Gastroenterology

## 2019-03-15 NOTE — Progress Notes (Signed)
Cardiology Office Note:    Date:  03/16/2019   ID:  [name], DOB 14-Mar-1946, MRN [id number]  PCP:  [name], MD  Cardiologist:  [name], MD  Referring MD: [name], MD   Chief Complaint  Patient presents with  . Follow-up  . Coronary Artery Disease  . Congestive Heart Failure    History of Present Illness:    [name] is a 73 y.o. female with a past medical history significant for COPD, CAD with nonobstructive disease by cath in 2000, CHF, CKD, hypertension, GERD, iron deficiency anemia, IBS, hyperlipidemia, type 2 diabetes and hyperlipidemia.  Echocardiogram in 2017 with EF 21-97%, grade 2 diastolic dysfunction, trivial aortic regurgitation and mild mitral regurgitation.   She was last seen in the office on 10/13/2018 by Dr. Domenic Polite at which time she was doing well.  She was on reduced dose of Lasix and her weight was actually improved.  He increased her metoprolol succinate from 50 to 75 mg daily  The patient was in the ED in 11/2018 for a fishbone getting caught in her throat.  No foreign body was identified on CT imaging.  She was suspected to have esophagitis/irritation or mucosal abrasion from the fishbone.  She was given viscous lidocaine and advised to see ENT if symptoms persist.  [name] is here today alone for follow up. Upon my entering the room she is audibly wheezing, calmed down with rest. She says that her wheezing limits her activity. She has not seen her pulmonary MD in about a year. She has a non-productive cough. Her nebulizer is not working. She uses inhalers. She is using a walker.   She has occasional weight on her chest at night or when very active. She has been busy lately packing for a move. She has dyspnea with very little activity. She sleeps with 2-3 pillows. Her breathing and chest heaviness are worse when first rising in the morning and in the hot/humid weather. She has tight swelling up to her knees that has  been present for years but progressively worse.   She did not go to get set up for lymphedema treatments earlier this year due to being in the hospital. Later she could not go due to it being closed for COVID. She does have some mechanical device that she uses for an hour a day at home. She says they are so big that she does not feel much squeezing.   Her lasix was decreased to 40 mg daily during one of her hospitalizations.    Past Medical History:  Diagnosis Date  . Blood transfusion without reported diagnosis   . CAD (coronary artery disease)    Nonobstructive at cardiac catheterization 2000  . Cataract   . CHF (congestive heart failure) ([place])   . Chronic back pain   . Chronic kidney disease   . COPD (chronic obstructive pulmonary disease) ([place])   . Degenerative disc disease   . Essential hypertension, benign   . GERD (gastroesophageal reflux disease)   . Gout   . Hiatal hernia 07/27/2013  . History of diverticulitis of colon   . History of hiatal hernia   . Iron deficiency anemia   . Irritable bowel syndrome   . Lumbar radiculopathy   . Mixed hyperlipidemia   . Neuropathy   . Osteoporosis   . Ovarian cancer ([place]) 04/02/2016  . Oxygen deficiency   . Sleep apnea   . Type 2 diabetes mellitus ([place])   .  Vitamin B deficiency 12/25/2009  . Vitamin B12 deficiency     Past Surgical History:  Procedure Laterality Date  . ABDOMINAL HYSTERECTOMY    . BACK SURGERY    . Benign breast cysts    . CHOLECYSTECTOMY    . COLONOSCOPY  10/01/2006   SLF:Pan colonic diverticulosis and moderate internal hemorrhoids/ Otherwise no polyps, masses, inflammatory changes or AVMs/  . COLONOSCOPY  2011   SLF: pancolonic diverticulosis, large internal hemorrhoids  . COLONOSCOPY N/A 01/26/2016   Procedure: COLONOSCOPY;  Surgeon: [name], MD;  Location: [facility];  Service: Endoscopy;  Laterality: N/A;  830   . ESOPHAGOGASTRODUODENOSCOPY  11/19/2006   SLF: Large hiatal hernia without  evidence of Cameron ulcers/. Distal esophageal stricture, which allowed the gastroscope to pass without resistance.  A 16 mm Savary later passed with mild resistance/ Normal stomach.sb bx negative  . ESOPHAGOGASTRODUODENOSCOPY  10/01/2006   IWL:NLGXQ hiatal hernia.  Distal esophagus without evidence of   erythema, ulceration or Barrett's esophagus  . ESOPHAGOGASTRODUODENOSCOPY  2011   SLF: large hh, distal esophageal web narrowing to 10m s/p dilation to 155m . ESOPHAGOGASTRODUODENOSCOPY N/A 08/06/2013   SLF: 1. Stricture at the gastroesophageal junction 2. large hiatal hernia. 3. Mild erosive gastritis.  . Marland KitchenIVENS CAPSULE STUDY N/A 08/06/2013   INCOMPLETE-SMALL BOWLE ULCERS  . KNEE SURGERY Right   . PARTIAL HYSTERECTOMY  1978  . small bowel capsule  2008   negative  . SPINE SURGERY    . TONSILLECTOMY AND ADENOIDECTOMY    . Two back surgeries/fusion    . UMBILICAL HERNIA REPAIR  2010    Current Medications: Current Meds  Medication Sig  . allopurinol (ZYLOPRIM) 100 MG tablet Take 1 tablet by mouth 2 (two) times daily.  . Marland Kitchenmitriptyline (ELAVIL) 100 MG tablet Take 100 mg by mouth at bedtime.    . Marland KitchenmLODipine (NORVASC) 5 MG tablet Take 1 tablet (5 mg total) by mouth daily.  . Marland Kitchenspirin EC 81 MG tablet Take 81 mg by mouth daily.  . Marland Kitchentorvastatin (LIPITOR) 40 MG tablet Take 1 tablet (40 mg total) by mouth at bedtime.  . Marland Kitchensomeprazole (NEXIUM) 40 MG capsule TAKE 1 CAPSULE TWICE A DAY 30 MINUTES PRIOR TO MEALS (Patient taking differently: Take 40 mg by mouth 2 (two) times daily as needed. )  . famotidine (PEPCID) 40 MG tablet TAKE 1 TABLET AT BEDTIME AS NEEDED TO CONTROL REFLUX (Patient taking differently: Take 40 mg by mouth at bedtime as needed for heartburn or indigestion. )  . furosemide (LASIX) 20 MG tablet Take 2 tablets (40 mg total) by mouth daily. Take 80 mg alternating with 60 mg every other day  . gabapentin (NEURONTIN) 300 MG capsule Take 1 capsule by mouth daily.   . insulin lispro  (HUMALOG) 100 UNIT/ML KiwkPen Inject 12-18 Units into the skin See admin instructions. Uses sliding scale and injects with meals.  Least she injects is 12 units. Most is 18 units.  . Marland KitchenANTUS SOLOSTAR 100 UNIT/ML SOPN Inject 20 Units into the skin at bedtime.   . lidocaine (XYLOCAINE) 2 % solution Use as directed 5 mLs in the mouth or throat every 4 (four) hours as needed for mouth pain (gargle and spit).  . Marland Kitchenoperamide (IMODIUM) 2 MG capsule Take 2 mg by mouth as needed for diarrhea or loose stools.  . meclizine (ANTIVERT) 25 MG tablet Take 1 tablet (25 mg total) by mouth 3 (three) times daily as needed for dizziness.  . metoprolol succinate (TOPROL-XL) 50  MG 24 hr tablet Take 75 mg by mouth daily. Take with or immediately following a meal.     Allergies:   Patient has no known allergies.   Social History   Socioeconomic History  . Marital status: Widowed    Spouse name: Shatz  . Number of children: 4  . Years of education: Not on file  . Highest education level: 10th grade  Occupational History  . Occupation: retired    Comment: Ambulance person, [employer] work  Scientific laboratory technician  . Financial resource strain: Not on file  . Food insecurity    Worry: Not on file    Inability: Not on file  . Transportation needs    Medical: Not on file    Non-medical: Not on file  Tobacco Use  . Smoking status: Former Smoker    Packs/day: 1.00    Years: 1.00    Pack years: 1.00    Types: Cigarettes    Start date: 02/19/1961    Quit date: 08/05/1961    Years since quitting: 57.6  . Smokeless tobacco: Never Used  . Tobacco comment: Quit smoking x 50 years  Substance and Sexual Activity  . Alcohol use: No  . Drug use: No  . Sexual activity: Not Currently  Lifestyle  . Physical activity    Days per week: Not on file    Minutes per session: Not on file  . Stress: Not on file  Relationships  . Social Herbalist on phone: Not on file    Gets together: Not on file    Attends religious service: Not  on file    Active member of club or organization: Not on file    Attends meetings of clubs or organizations: Not on file    Relationship status: Not on file  Other Topics Concern  . Not on file  Social History Narrative   HAS 4 SON-GRAND [place].   Lives in home with Kalp - married 13 Y   Cook, sew, quilt, chochet     Family History: The patient's family history includes Asthma in her mother and sister; Colon cancer in her brother; Diabetes in her brother, mother, and sister; Early death in her father; Heart attack in her brother, maternal grandmother, mother, sister, and another family member; Heart disease in her brother and mother; Kidney failure in her sister; Stroke in her maternal grandmother, mother, and sister; Ulcers in her mother and sister. ROS:   Please see the history of present illness.     All other systems reviewed and are negative.  EKGs/Labs/Other Studies Reviewed:    The following studies were reviewed today:  Echocardiogram10/01/2016: Study Conclusions  - Left ventricle: The cavity size was normal. Wall thickness was increased in a pattern of mild LVH. There was moderate focal basal hypertrophy of the septum. Systolic function was normal. The estimated ejection fraction was in the range of 60% to 65%. Wall motion was normal; there were no regional wall motion abnormalities. Features are consistent with a pseudonormal left ventricular filling pattern, with concomitant abnormal relaxation and increased filling pressure (grade 2 diastolic dysfunction). - Aortic valve: Mildly calcified annulus. Trileaflet; mildly calcified leaflets. There was trivial regurgitation. Mean gradient (S): 5 mm Hg. Valve area (Vmax): 1.92 cm^2. - Mitral valve: Calcified annulus. Mildly thickened leaflets . There was mild regurgitation. - Left atrium: The atrium was moderately dilated. - Right atrium: Central venous pressure (est): 3 mm Hg. -  Tricuspid valve: There  was trivial regurgitation. - Pulmonary arteries: PA peak pressure: 43 mm Hg (S). - Pericardium, extracardiac: Small circumferential pericardial effusion, possibly somewhat larger collection posteriorly.  Impressions:  - Mild LVH with LVEF 60-65%. Grade 2 diastolic dysfunction with increased LV filling pressure. Moderate left atrial enlargement. MAC with mildly thickened and calcified mitral leaflets, mild mitral regurgitation. Sclerotic aortic valve with trivial aortic regurgitation. Trivial tricuspid regurgitation with PASP 43 mmHg. Small circumferential pericardial effusion, possibly somewhat larger collection posteriorly.   EKG:  EKG is not ordered today.    Recent Labs: 07/12/2018: B Natriuretic Peptide 136.0 11/22/2018: BUN 22; Creatinine, Ser 1.21; Hemoglobin 10.8; Platelets 304; Potassium 5.4; Sodium 138   Recent Lipid Panel    Component Value Date/Time   CHOL 257 (H) 10/10/2017 1050   TRIG 177 (H) 10/10/2017 1050   HDL 38 (L) 10/10/2017 1050   CHOLHDL 6.8 (H) 10/10/2017 1050   LDLCALC 186 (H) 10/10/2017 1050    Physical Exam:    VS:  BP (!) 170/72   Pulse (!) 107   Temp 98.8 F (37.1 C)   Ht _0  (1.422 m)   Wt 198 lb (89.8 kg)   SpO2 91%   BMI 44.39 kg/m     Wt Readings from Last 3 Encounters:  03/16/19 198 lb (89.8 kg)  11/22/18 195 lb (88.5 kg)  10/13/18 196 lb (88.9 kg)     Physical Exam  Constitutional: She is oriented to person, place, and time. No distress.  Extremely obese female  HENT:  Head: Normocephalic and atraumatic.  Neck: Normal range of motion. Neck supple.  Unable to see any JVD, difficult due to body habitus.   Cardiovascular: Normal rate, regular rhythm, normal heart sounds and intact distal pulses. Exam reveals no gallop and no friction rub.  No murmur heard. Occ early beats  Pulmonary/Chest: No respiratory distress. She has decreased breath sounds. She has no wheezes. She has no rales.   Abdominal:  Large abdomen  Musculoskeletal:        General: Edema present.     Comments: Significant LE edema up to knees.   Neurological: She is alert and oriented to person, place, and time.  Skin: Skin is warm and dry.  Psychiatric: She has a normal mood and affect. Her behavior is normal. Judgment and thought content normal.  Vitals reviewed.    ASSESSMENT:    1. Coronary artery disease involving coronary bypass graft of native heart without angina pectoris   2. Chronic diastolic heart failure (HCC)   3. Lymphedema   4. CKD (chronic kidney disease), stage II   5. Essential (primary) hypertension   6. Hyperlipidemia, unspecified hyperlipidemia type   7. DM type 2 with diabetic peripheral neuropathy ([place])   8. Morbid (severe) obesity due to excess calories ([place])   9. OSA on CPAP   10. Chronic obstructive pulmonary disease, unspecified COPD type ([place])    PLAN:    In order of problems listed above:  History of nonobstructive CAD by cath in 2000 -Patient continues on aspirin and statin. -She has some chest heaviness with shortness of breath and wheezing likely related to her COPD and also unconctrolled HTN.  -I do not think she could tolerate a stress test due to her respiratory status. She needs her COPD treated as well as getting her blood pressure better controlled.   Chronic diastolic CHF -On Lasix. Dosing was decreased, likely related to renal function.  -Weight is up about 3 pounds from last visit.  -I  feel that she needs more diuresis. I will check BMet and if OK will increase Lasix from 40 mg daily to 40 mg BID.   Chronic lymphedema -Lymphedema clinic for mechanical compression-has been closed due to COVID. She has some leg pumps at home that she feels do not do anything.  -On low-dose Lasix. Will check BMet. Possibly increase lasix to 40 mg BID if labs permit.  -Low sodium diet. Elevate legs when possible.   COPD -Activity is limited by her breathing. She had  wheezing upon entering the office, calmed down after rest.  -Pt with significant lung disease. Has not seen a pulmonologist in over a year. Was having to go [facility]. WIll try to refer to a pulmonologist closer to her home.   CKD stage III -Creatinine was up to 2 in December 2019.  Labs in epic in April showed serum creatinine 1.21  Essential hypertension -Amlodipine 5 mg, Toprol-XL 75 mg daily and Lasix -BP uncontrolled. I do not want to increase amlodipine in setting of severe LE edema. I also do not want to increase Toprol in setting of COPD with wheezing. No ACE/ARB with renal function.  I will add hydralazine 25 mg BID.   Hyperlipidemia -On atorvastatin 40 mg  Diabetes type 2 on insulin -Followed by PCP  Morbid obesity -She needs to lose weight. Her weight is difficult to carry on her 4'8" frame.   OSA on CPAP -Uses every night  Medication Adjustments/Labs and Tests Ordered: Current medicines are reviewed at length with the patient today.  Concerns regarding medicines are outlined above. Labs and tests ordered and medication changes are outlined in the patient instructions below:  Patient Instructions  Medication Instructions:   Begin Hydralazine 66m twice a day.  Continue all other medications.    Labwork:  BMET - order given today.   Office will contact with results via phone or letter.    Testing/Procedures: none  Follow-Up: 3 weeks   Any Other Special Instructions Will Be Listed Below (If Applicable). You have been referred to:  Dr. HLuan Pullingfor evaluation of COPD.  If you need a refill on your cardiac medications before your next appointment, please call your pharmacy.  ---------------------------------------------------------------------  Low-Sodium Eating Plan Sodium, which is an element that makes up salt, helps you maintain a healthy balance of fluids in your body. Too much sodium can increase your blood pressure and cause fluid and waste to be  held in your body. Your health care provider or dietitian may recommend following this plan if you have high blood pressure (hypertension), kidney disease, liver disease, or heart failure. Eating less sodium can help lower your blood pressure, reduce swelling, and protect your heart, liver, and kidneys. What are tips for following this plan? General guidelines  Most people on this plan should limit their sodium intake to 1,500-2,000 mg (milligrams) of sodium each day. Reading food labels   The Nutrition Facts label lists the amount of sodium in one serving of the food. If you eat more than one serving, you must multiply the listed amount of sodium by the number of servings.  Choose foods with less than 140 mg of sodium per serving.  Avoid foods with 300 mg of sodium or more per serving. Shopping  Look for lower-sodium products, often labeled as "low-sodium" or "no salt added."  Always check the sodium content even if foods are labeled as "unsalted" or "no salt added".  Buy fresh foods. ? Avoid canned foods and premade  or frozen meals. ? Avoid canned, cured, or processed meats  Buy breads that have less than 80 mg of sodium per slice. Cooking  Eat more home-cooked food and less restaurant, buffet, and fast food.  Avoid adding salt when cooking. Use salt-free seasonings or herbs instead of table salt or sea salt. Check with your health care provider or pharmacist before using salt substitutes.  Cook with plant-based oils, such as canola, sunflower, or olive oil. Meal planning  When eating at a restaurant, ask that your food be prepared with less salt or no salt, if possible.  Avoid foods that contain MSG (monosodium glutamate). MSG is sometimes added to [place] food, bouillon, and some canned foods. What foods are recommended? The items listed may not be a complete list. Talk with your dietitian about what dietary choices are best for you. Grains Low-sodium cereals, including  oats, puffed wheat and rice, and shredded wheat. Low-sodium crackers. Unsalted rice. Unsalted pasta. Low-sodium bread. Whole-grain breads and whole-grain pasta. Vegetables Fresh or frozen vegetables. "No salt added" canned vegetables. "No salt added" tomato sauce and paste. Low-sodium or reduced-sodium tomato and vegetable juice. Fruits Fresh, frozen, or canned fruit. Fruit juice. Meats and other protein foods Fresh or frozen (no salt added) meat, poultry, seafood, and fish. Low-sodium canned tuna and salmon. Unsalted nuts. Dried peas, beans, and lentils without added salt. Unsalted canned beans. Eggs. Unsalted nut butters. Dairy Milk. Soy milk. Cheese that is naturally low in sodium, such as ricotta cheese, fresh mozzarella, or Swiss cheese Low-sodium or reduced-sodium cheese. Cream cheese. Yogurt. Fats and oils Unsalted butter. Unsalted margarine with no trans fat. Vegetable oils such as canola or olive oils. Seasonings and other foods Fresh and dried herbs and spices. Salt-free seasonings. Low-sodium mustard and ketchup. Sodium-free salad dressing. Sodium-free light mayonnaise. Fresh or refrigerated horseradish. Lemon juice. Vinegar. Homemade, reduced-sodium, or low-sodium soups. Unsalted popcorn and pretzels. Low-salt or salt-free chips. What foods are not recommended? The items listed may not be a complete list. Talk with your dietitian about what dietary choices are best for you. Grains Instant hot cereals. Bread stuffing, pancake, and biscuit mixes. Croutons. Seasoned rice or pasta mixes. Noodle soup cups. Boxed or frozen macaroni and cheese. Regular salted crackers. Self-rising flour. Vegetables Sauerkraut, pickled vegetables, and relishes. Olives. [place] fries. Onion rings. Regular canned vegetables (not low-sodium or reduced-sodium). Regular canned tomato sauce and paste (not low-sodium or reduced-sodium). Regular tomato and vegetable juice (not low-sodium or reduced-sodium). Frozen  vegetables in sauces. Meats and other protein foods Meat or fish that is salted, canned, smoked, spiced, or pickled. Bacon, ham, sausage, hotdogs, corned beef, chipped beef, packaged lunch meats, salt pork, jerky, pickled herring, anchovies, regular canned tuna, sardines, salted nuts. Dairy Processed cheese and cheese spreads. Cheese curds. Blue cheese. Feta cheese. String cheese. Regular cottage cheese. Buttermilk. Canned milk. Fats and oils Salted butter. Regular margarine. Ghee. Bacon fat. Seasonings and other foods Onion salt, garlic salt, seasoned salt, table salt, and sea salt. Canned and packaged gravies. Worcestershire sauce. Tartar sauce. Barbecue sauce. Teriyaki sauce. Soy sauce, including reduced-sodium. Steak sauce. Fish sauce. Oyster sauce. Cocktail sauce. Horseradish that you find on the shelf. Regular ketchup and mustard. Meat flavorings and tenderizers. Bouillon cubes. Hot sauce and Tabasco sauce. Premade or packaged marinades. Premade or packaged taco seasonings. Relishes. Regular salad dressings. Salsa. Potato and tortilla chips. Corn chips and puffs. Salted popcorn and pretzels. Canned or dried soups. Pizza. Frozen entrees and pot pies. Summary  Eating less sodium can help  lower your blood pressure, reduce swelling, and protect your heart, liver, and kidneys.  Most people on this plan should limit their sodium intake to 1,500-2,000 mg (milligrams) of sodium each day.  Canned, boxed, and frozen foods are high in sodium. Restaurant foods, fast foods, and pizza are also very high in sodium. You also get sodium by adding salt to food.  Try to cook at home, eat more fresh fruits and vegetables, and eat less fast food, canned, processed, or prepared foods. This information is not intended to replace advice given to you by your health care provider. Make sure you discuss any questions you have with your health care provider. Document Released: 01/11/2002 Document Revised: 07/04/2017  Document Reviewed: 07/15/2016 Elsevier Patient Education  2020 [place], Daune Perch, NP  03/16/2019 11:42 AM    [place]

## 2019-03-15 NOTE — Patient Instructions (Addendum)
Medication Instructions:   Begin Hydralazine 25mg  twice a day.  Continue all other medications.    Labwork:  BMET - order given today.   Office will contact with results via phone or letter.    Testing/Procedures: none  Follow-Up: 3 weeks   Any Other Special Instructions Will Be Listed Below (If Applicable). You have been referred to:  Dr. Luan Pulling for evaluation of COPD.  If you need a refill on your cardiac medications before your next appointment, please call your pharmacy.  ---------------------------------------------------------------------  Low-Sodium Eating Plan Sodium, which is an element that makes up salt, helps you maintain a healthy balance of fluids in your body. Too much sodium can increase your blood pressure and cause fluid and waste to be held in your body. Your health care provider or dietitian may recommend following this plan if you have high blood pressure (hypertension), kidney disease, liver disease, or heart failure. Eating less sodium can help lower your blood pressure, reduce swelling, and protect your heart, liver, and kidneys. What are tips for following this plan? General guidelines  Most people on this plan should limit their sodium intake to 1,500-2,000 mg (milligrams) of sodium each day. Reading food labels   The Nutrition Facts label lists the amount of sodium in one serving of the food. If you eat more than one serving, you must multiply the listed amount of sodium by the number of servings.  Choose foods with less than 140 mg of sodium per serving.  Avoid foods with 300 mg of sodium or more per serving. Shopping  Look for lower-sodium products, often labeled as "low-sodium" or "no salt added."  Always check the sodium content even if foods are labeled as "unsalted" or "no salt added".  Buy fresh foods. ? Avoid canned foods and premade or frozen meals. ? Avoid canned, cured, or processed meats  Buy breads that have less than 80 mg of  sodium per slice. Cooking  Eat more home-cooked food and less restaurant, buffet, and fast food.  Avoid adding salt when cooking. Use salt-free seasonings or herbs instead of table salt or sea salt. Check with your health care provider or pharmacist before using salt substitutes.  Cook with plant-based oils, such as canola, sunflower, or olive oil. Meal planning  When eating at a restaurant, ask that your food be prepared with less salt or no salt, if possible.  Avoid foods that contain MSG (monosodium glutamate). MSG is sometimes added to [place] food, bouillon, and some canned foods. What foods are recommended? The items listed may not be a complete list. Talk with your dietitian about what dietary choices are best for you. Grains Low-sodium cereals, including oats, puffed wheat and rice, and shredded wheat. Low-sodium crackers. Unsalted rice. Unsalted pasta. Low-sodium bread. Whole-grain breads and whole-grain pasta. Vegetables Fresh or frozen vegetables. "No salt added" canned vegetables. "No salt added" tomato sauce and paste. Low-sodium or reduced-sodium tomato and vegetable juice. Fruits Fresh, frozen, or canned fruit. Fruit juice. Meats and other protein foods Fresh or frozen (no salt added) meat, poultry, seafood, and fish. Low-sodium canned tuna and salmon. Unsalted nuts. Dried peas, beans, and lentils without added salt. Unsalted canned beans. Eggs. Unsalted nut butters. Dairy Milk. Soy milk. Cheese that is naturally low in sodium, such as ricotta cheese, fresh mozzarella, or Swiss cheese Low-sodium or reduced-sodium cheese. Cream cheese. Yogurt. Fats and oils Unsalted butter. Unsalted margarine with no trans fat. Vegetable oils such as canola or olive oils. Seasonings and other foods Fresh  and dried herbs and spices. Salt-free seasonings. Low-sodium mustard and ketchup. Sodium-free salad dressing. Sodium-free light mayonnaise. Fresh or refrigerated horseradish. Lemon juice.  Vinegar. Homemade, reduced-sodium, or low-sodium soups. Unsalted popcorn and pretzels. Low-salt or salt-free chips. What foods are not recommended? The items listed may not be a complete list. Talk with your dietitian about what dietary choices are best for you. Grains Instant hot cereals. Bread stuffing, pancake, and biscuit mixes. Croutons. Seasoned rice or pasta mixes. Noodle soup cups. Boxed or frozen macaroni and cheese. Regular salted crackers. Self-rising flour. Vegetables Sauerkraut, pickled vegetables, and relishes. Olives. [place] fries. Onion rings. Regular canned vegetables (not low-sodium or reduced-sodium). Regular canned tomato sauce and paste (not low-sodium or reduced-sodium). Regular tomato and vegetable juice (not low-sodium or reduced-sodium). Frozen vegetables in sauces. Meats and other protein foods Meat or fish that is salted, canned, smoked, spiced, or pickled. Bacon, ham, sausage, hotdogs, corned beef, chipped beef, packaged lunch meats, salt pork, jerky, pickled herring, anchovies, regular canned tuna, sardines, salted nuts. Dairy Processed cheese and cheese spreads. Cheese curds. Blue cheese. Feta cheese. String cheese. Regular cottage cheese. Buttermilk. Canned milk. Fats and oils Salted butter. Regular margarine. Ghee. Bacon fat. Seasonings and other foods Onion salt, garlic salt, seasoned salt, table salt, and sea salt. Canned and packaged gravies. Worcestershire sauce. Tartar sauce. Barbecue sauce. Teriyaki sauce. Soy sauce, including reduced-sodium. Steak sauce. Fish sauce. Oyster sauce. Cocktail sauce. Horseradish that you find on the shelf. Regular ketchup and mustard. Meat flavorings and tenderizers. Bouillon cubes. Hot sauce and Tabasco sauce. Premade or packaged marinades. Premade or packaged taco seasonings. Relishes. Regular salad dressings. Salsa. Potato and tortilla chips. Corn chips and puffs. Salted popcorn and pretzels. Canned or dried soups. Pizza. Frozen  entrees and pot pies. Summary  Eating less sodium can help lower your blood pressure, reduce swelling, and protect your heart, liver, and kidneys.  Most people on this plan should limit their sodium intake to 1,500-2,000 mg (milligrams) of sodium each day.  Canned, boxed, and frozen foods are high in sodium. Restaurant foods, fast foods, and pizza are also very high in sodium. You also get sodium by adding salt to food.  Try to cook at home, eat more fresh fruits and vegetables, and eat less fast food, canned, processed, or prepared foods. This information is not intended to replace advice given to you by your health care provider. Make sure you discuss any questions you have with your health care provider. Document Released: 01/11/2002 Document Revised: 07/04/2017 Document Reviewed: 07/15/2016 Elsevier Patient Education  2020 [employer].

## 2019-03-16 ENCOUNTER — Encounter: Payer: Self-pay | Admitting: Cardiology

## 2019-03-16 ENCOUNTER — Other Ambulatory Visit: Payer: Self-pay

## 2019-03-16 ENCOUNTER — Ambulatory Visit (INDEPENDENT_AMBULATORY_CARE_PROVIDER_SITE_OTHER): Payer: Medicare Other | Admitting: Cardiology

## 2019-03-16 ENCOUNTER — Other Ambulatory Visit: Payer: Self-pay | Admitting: Cardiology

## 2019-03-16 VITALS — BP 170/72 | HR 107 | Temp 98.8°F | Ht <= 58 in | Wt 198.0 lb

## 2019-03-16 DIAGNOSIS — I25810 Atherosclerosis of coronary artery bypass graft(s) without angina pectoris: Secondary | ICD-10-CM

## 2019-03-16 DIAGNOSIS — I5032 Chronic diastolic (congestive) heart failure: Secondary | ICD-10-CM | POA: Diagnosis not present

## 2019-03-16 DIAGNOSIS — I890 Lymphedema, not elsewhere classified: Secondary | ICD-10-CM

## 2019-03-16 DIAGNOSIS — Z9989 Dependence on other enabling machines and devices: Secondary | ICD-10-CM

## 2019-03-16 DIAGNOSIS — J449 Chronic obstructive pulmonary disease, unspecified: Secondary | ICD-10-CM

## 2019-03-16 DIAGNOSIS — I10 Essential (primary) hypertension: Secondary | ICD-10-CM

## 2019-03-16 DIAGNOSIS — E785 Hyperlipidemia, unspecified: Secondary | ICD-10-CM

## 2019-03-16 DIAGNOSIS — N182 Chronic kidney disease, stage 2 (mild): Secondary | ICD-10-CM

## 2019-03-16 DIAGNOSIS — E1142 Type 2 diabetes mellitus with diabetic polyneuropathy: Secondary | ICD-10-CM

## 2019-03-16 DIAGNOSIS — G4733 Obstructive sleep apnea (adult) (pediatric): Secondary | ICD-10-CM

## 2019-03-16 MED ORDER — SODIUM POLYSTYRENE SULFONATE PO POWD: Freq: Once | ORAL | 0 refills | Status: AC

## 2019-03-16 MED ORDER — HYDRALAZINE HCL 25 MG PO TABS: 25.0000 mg | ORAL_TABLET | Freq: Two times a day (BID) | ORAL | 6 refills | Status: DC

## 2019-03-17 ENCOUNTER — Other Ambulatory Visit: Payer: Self-pay | Admitting: Cardiology

## 2019-03-17 ENCOUNTER — Telehealth: Payer: Self-pay | Admitting: *Deleted

## 2019-03-17 DIAGNOSIS — E875 Hyperkalemia: Secondary | ICD-10-CM

## 2019-03-17 NOTE — Telephone Encounter (Signed)
Spoke with pharmacy - liquid is okay.  Per Rita Perch, NP - order was sent in yesterday evening by provider -   will send in Kayexalate 15 g every 6 hours x2 to her pharmacy

## 2019-03-17 NOTE — Telephone Encounter (Signed)
Notes recorded by Laurine Blazer, LPN on 8/56/3149 at 7:02 AM EDT  Patient aware as stated above. She will do her BMET at [facility] this Friday, 03/19/19. Order faxed now.  ------   Notes recorded by Daune Perch, NP on 03/16/2019 at 5:58 PM EDT  Will do Kayexelate X1 dose. Sent to [facility]. Pt will pick up tomorrow.  Daune Perch, NP  ------   Notes recorded by Daune Perch, NP on 03/16/2019 at 5:50 PM EDT  Kidney function is actually improved, but potassium is high. I spoke to patient on the phone and she says she has had high potassium before and usually nothing is done. She does note that she has occasional palpitations. I will send in Kayexalate 15 g every 6 hours x2 to her pharmacy. She will not be able to pick it up till tomorrow. We discussed avoiding foods and beverage high in potassium like orange juice, bananas. She has been having both orange juice and bananas. Recheck BMet in 2 days.   Daune Perch, NP

## 2019-03-17 NOTE — Telephone Encounter (Signed)
New Message    *STAT* If patient is at the pharmacy, call can be transferred to refill team.   1. Which medications need to be refilled? (please list name of each medication and dose if known) kayexalate (powder), requesting to be changed to the liquid vs the powder  2. Which pharmacy/location (including street and city if local pharmacy) is medication to be sent to? [place], [place]  3. Do they need a 30 day or 90 day supply? 1 day    States that the medication was called in for patient on 03/16/19 as a one time medication.

## 2019-04-15 ENCOUNTER — Ambulatory Visit: Payer: 59 | Admitting: Cardiology

## 2019-04-28 DIAGNOSIS — I890 Lymphedema, not elsewhere classified: Secondary | ICD-10-CM | POA: Insufficient documentation

## 2019-04-28 NOTE — Progress Notes (Deleted)
Cardiology Office Note    Date:  04/28/2019   ID:  Felicitas, Sine 1946-03-20, MRN [id number]  PCP:  [name], MD  Cardiologist: [name], MD EPS: None  No chief complaint on file.   History of Present Illness:  [name] is a 73 y.o. female with history of  CAD with nonobstructive disease by cath in 2000, CHF, CKD, hypertension, COPD,GERD, iron deficiency anemia, IBS, hyperlipidemia, type 2 diabetes and hyperlipidemia.  Echocardiogram in 2017 with EF 78-93%, grade 2 diastolic dysfunction, trivial aortic regurgitation and mild mitral regurgitation.  Was last seen by a provider 03/16/19 with audible wheezing, chronic lymphedema-was supposed to go to lymphedema clinic but close with covid. Hydralazine added for better BP control. potasium was high 5.8 and given Kayexalate. Repeat K 5.7     Past Medical History:  Diagnosis Date  . Blood transfusion without reported diagnosis   . CAD (coronary artery disease)    Nonobstructive at cardiac catheterization 2000  . Cataract   . CHF (congestive heart failure) ([place])   . Chronic back pain   . Chronic kidney disease   . COPD (chronic obstructive pulmonary disease) ([place])   . Degenerative disc disease   . Essential hypertension, benign   . GERD (gastroesophageal reflux disease)   . Gout   . Hiatal hernia 07/27/2013  . History of diverticulitis of colon   . History of hiatal hernia   . Iron deficiency anemia   . Irritable bowel syndrome   . Lumbar radiculopathy   . Mixed hyperlipidemia   . Neuropathy   . Osteoporosis   . Ovarian cancer ([place]) 04/02/2016  . Oxygen deficiency   . Sleep apnea   . Type 2 diabetes mellitus ([place])   . Vitamin B deficiency 12/25/2009  . Vitamin B12 deficiency     Past Surgical History:  Procedure Laterality Date  . ABDOMINAL HYSTERECTOMY    . BACK SURGERY    . Benign breast cysts    . CHOLECYSTECTOMY    . COLONOSCOPY  10/01/2006   SLF:Pan colonic diverticulosis and  moderate internal hemorrhoids/ Otherwise no polyps, masses, inflammatory changes or AVMs/  . COLONOSCOPY  2011   SLF: pancolonic diverticulosis, large internal hemorrhoids  . COLONOSCOPY N/A 01/26/2016   Procedure: COLONOSCOPY;  Surgeon: [name], MD;  Location: [facility];  Service: Endoscopy;  Laterality: N/A;  830   . ESOPHAGOGASTRODUODENOSCOPY  11/19/2006   SLF: Large hiatal hernia without evidence of Cameron ulcers/. Distal esophageal stricture, which allowed the gastroscope to pass without resistance.  A 16 mm Savary later passed with mild resistance/ Normal stomach.sb bx negative  . ESOPHAGOGASTRODUODENOSCOPY  10/01/2006   YBO:FBPZW hiatal hernia.  Distal esophagus without evidence of   erythema, ulceration or Barrett's esophagus  . ESOPHAGOGASTRODUODENOSCOPY  2011   SLF: large hh, distal esophageal web narrowing to 53m s/p dilation to 154m . ESOPHAGOGASTRODUODENOSCOPY N/A 08/06/2013   SLF: 1. Stricture at the gastroesophageal junction 2. large hiatal hernia. 3. Mild erosive gastritis.  . Marland KitchenIVENS CAPSULE STUDY N/A 08/06/2013   INCOMPLETE-SMALL BOWLE ULCERS  . KNEE SURGERY Right   . PARTIAL HYSTERECTOMY  1978  . small bowel capsule  2008   negative  . SPINE SURGERY    . TONSILLECTOMY AND ADENOIDECTOMY    . Two back surgeries/fusion    . UMBILICAL HERNIA REPAIR  2010    Current Medications: No outpatient medications have been marked as taking for the 05/03/19 encounter (Appointment) with LeErmalinda Barrios  M, PA-C.     Allergies:   Patient has no known allergies.   Social History   Socioeconomic History  . Marital status: Widowed    Spouse name: Cavagnaro  . Number of children: 4  . Years of education: Not on file  . Highest education level: 10th grade  Occupational History  . 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 Financial resource strain: Not on file  . Food insecurity    Worry: Not on file    Inability: Not on file  .  Transportation needs    Medical: Not on file    Non-medical: Not on file  Tobacco Use  . Smoking status: Former Smoker    Packs/day: 1.00    Years: 1.00    Pack years: 1.00    Types: Cigarettes    Start date: 02/19/1961    Quit date: 08/05/1961    Years since quitting: 57.7  . Smokeless tobacco: Never Used  . Tobacco comment: Quit smoking x 50 years  Substance and Sexual Activity  . Alcohol use: No  . Drug use: No  . Sexual activity: Not Currently  Lifestyle  . Physical activity    Days per week: Not on file    Minutes per session: Not on file  . Stress: Not on file  Relationships  . Social Herbalist on phone: Not on file    Gets together: Not on file    Attends religious service: Not on file    Active member of club or organization: Not on file    Attends meetings of clubs or organizations: Not on file    Relationship status: Not on file  Other Topics Concern  . Not on file  Social History Narrative   HAS 4 SON-GRAND Driscoll.   Lives in home with Mcneeley - married 13 Y   Cook, sew, quilt, chochet     Family History:  The patient's ***family history includes Asthma in her mother and sister; Colon cancer in her brother; Diabetes in her brother, mother, and sister; Early death in her father; Heart attack in her brother, maternal grandmother, mother, sister, and another family member; Heart disease in her brother and mother; Kidney failure in her sister; Stroke in her maternal grandmother, mother, and sister; Ulcers in her mother and sister.   ROS:   Please see the history of present illness.    ROS All other systems reviewed and are negative.   PHYSICAL EXAM:   VS:  There were no vitals taken for this visit.  Physical Exam  GEN: Well nourished, well developed, in no acute distress  HEENT: normal  Neck: no JVD, carotid bruits, or masses Cardiac:RRR; no murmurs, rubs, or gallops  Respiratory:  clear to auscultation bilaterally, normal work of  breathing GI: soft, nontender, nondistended, + BS Ext: without cyanosis, clubbing, or edema, Good distal pulses bilaterally MS: no deformity or atrophy  Skin: warm and dry, no rash Neuro:  Alert and Oriented x 3, Strength and sensation are intact Psych: euthymic mood, full affect  Wt Readings from Last 3 Encounters:  03/16/19 198 lb (89.8 kg)  11/22/18 195 lb (88.5 kg)  10/13/18 196 lb (88.9 kg)      Studies/Labs Reviewed:   EKG:  EKG is*** ordered today.  The ekg ordered today demonstrates ***  Recent Labs: 07/12/2018: B Natriuretic Peptide 136.0 11/22/2018: BUN 22; Creatinine, Ser 1.21; Hemoglobin 10.8; Platelets  304; Potassium 5.4; Sodium 138   Lipid Panel    Component Value Date/Time   CHOL 257 (H) 10/10/2017 1050   TRIG 177 (H) 10/10/2017 1050   HDL 38 (L) 10/10/2017 1050   CHOLHDL 6.8 (H) 10/10/2017 1050   LDLCALC 186 (H) 10/10/2017 1050    Additional studies/ records that were reviewed today include:   Echocardiogram 05/10/2016: Study Conclusions   - Left ventricle: The cavity size was normal. Wall thickness was   increased in a pattern of mild LVH. There was moderate focal   basal hypertrophy of the septum. Systolic function was normal.   The estimated ejection fraction was in the range of 60% to 65%.   Wall motion was normal; there were no regional wall motion   abnormalities. Features are consistent with a pseudonormal left   ventricular filling pattern, with concomitant abnormal relaxation   and increased filling pressure (grade 2 diastolic dysfunction). - Aortic valve: Mildly calcified annulus. Trileaflet; mildly   calcified leaflets. There was trivial regurgitation. Mean   gradient (S): 5 mm Hg. Valve area (Vmax): 1.92 cm^2. - Mitral valve: Calcified annulus. Mildly thickened leaflets .   There was mild regurgitation. - Left atrium: The atrium was moderately dilated. - Right atrium: Central venous pressure (est): 3 mm Hg. - Tricuspid valve: There was  trivial regurgitation. - Pulmonary arteries: PA peak pressure: 43 mm Hg (S). - Pericardium, extracardiac: Small circumferential pericardial   effusion, possibly somewhat larger collection posteriorly.   Impressions:   - Mild LVH with LVEF 60-65%. Grade 2 diastolic dysfunction with   increased LV filling pressure. Moderate left atrial enlargement.   MAC with mildly thickened and calcified mitral leaflets, mild   mitral regurgitation. Sclerotic aortic valve with trivial aortic   regurgitation. Trivial tricuspid regurgitation with PASP 43 mmHg.   Small circumferential pericardial effusion, possibly somewhat   larger collection posteriorly.      ASSESSMENT:    1. Acute on chronic diastolic CHF (congestive heart failure) (HCC)   2. Lymphedema of both lower extremities   3. Benign essential HTN   4. Hyperlipidemia, unspecified hyperlipidemia type      PLAN:  In order of problems listed above:  Chronic diastolic CHF  Chronic LE lymphedema- was to go to lymphedema clinic but closed with covid  HTN  HLD  CAD-nonobstructive on prior cath    Medication Adjustments/Labs and Tests Ordered: Current medicines are reviewed at length with the patient today.  Concerns regarding medicines are outlined above.  Medication changes, Labs and Tests ordered today are listed in the Patient Instructions below. There are no Patient Instructions on file for this visit.   Sumner Boast, PA-C  04/28/2019 3:40 PM    [facility] [address], [place], [place]  [address] Phone: [phone]; Fax: [phone]

## 2019-05-03 ENCOUNTER — Ambulatory Visit: Payer: 59 | Admitting: Physician Assistant

## 2019-05-29 NOTE — Progress Notes (Addendum)
REVIEWED. PT ABLE TO COMPLETE TCS IN 1610 WITHOUT COMPLICATION AND BMI > 50. ADMITTED NOV 2020 WITH AFIB/RVR. NOW ANTICOAGULATED. WOULD HAVE PT RETURN FOR OPV IN 6 MOS TO DISCUSS BENEFITS V. RISKS OF STOPPING [facility] FOR ENDOSCOPY.  Referring Provider: [name], MD Primary Care Physician:  [name], MD Primary GI Physician: Dr. Oneida Alar  Chief Complaint  Patient presents with  . Abdominal Pain    upper down to lower abd  . diarrhea/constipation  . Nausea    but no vomiting    HPI:   [name] is a 73 y.o. 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    Last colonoscopy on 01/26/2016 with pancolonic diverticulosis, 3 colorectal polyps, moderately redundant colon, nonbleeding external and internal hemorrhoids, loose/watery stools most likely secondary to IBS-C with overflow incontinence.  Pathology will 2 tubular adenomas and one benign polyp.  Recommended repeat colonoscopy in 3 years.  EGD with Givens capsule placement on 08/06/2013.  EGD with stricture at the GE junction s/p dilation, large hiatal hernia, mild erosive gastritis s/p biopsies.  Pathology with reactive gastropathy with slight inflammation. Givens capsule with occasional small bowel ulcers.  Anemia likely related to erosive gastritis and aspirin causing small bowel ulcers as well as chronic renal insufficiency.  Recommendations to discontinue aspirin and see hematology for IV Fe and B12 injections.   Patient was last seen in our office on 09/18/2017 for GERD, constipation, and flatulence.  GERD symptoms are well controlled.  Constipation resolved, now with 2-3 BMs daily consistent with [facility] 6, rare [place] 7.  Also reporting increase in flatulence for the last 5-6 weeks.  Mild abdominal pain off and on.  Suspect that her symptoms may be secondary to SIBO and  food intolerance.  Advise she avoid items that cause gas and bloating, add lactase 3 pills with meals 3 times a day, Cipro and Flagyl twice daily x5 days.  Patient saw PCP on 10/14 for a wellness visit.  She reported 1 week history of abdominal pain and abdominal swelling for a week.  Also reported constipation.  Was sent for CT of abdomen and advised to increase diuretics. CT on 05/25/2019 significant for moderate to large hiatal hernia with chronic wall thickening of the herniated stomach suspicious for gastritis, pelvic floor laxity suggestive of rectal prolapse, anasarca significantly progressed, and bilateral renal atrophy.  Follow-up with PCP on 05/27/2019. Increase diuretics were not effective and she was having more shortness of breath.   Today she states her generalized swelling has significantly worsened over the last 3-4 weeks. Usually will have mild swelling in abdomen and lower legs but nothing like it is now.  History of lymphadema. Uses leg compression pumps. This is not helping much at this point. She has been taking 80 mg Lasix for the last week or so. Now has decreased to 40 with addition of spironolactone 50 mg for the last 3 days. Has not noticed any improvement in swelling. Doesn't feel like she is urinating a lot. Chronic SOB especially with minimal activity along with coughing with COPD. States her breathing has worsened some over the last week. Has nonproductive cough that has also worsened over the last week.    When asking about abdominal pain, she states, "Sometimes it feels like my insides are being ripped apart, other times just sore." Pain is present daily. Persistent. Rare  abdominal pain prior to 3 weeks ago. Would only have pain when she would have trouble with constipation. Abdominal pain now is all over. Nausea intermittent with meals. No specific foods. Not with every meal. Not taking anything to help with this.Present for the last week, off and on. No vomiting.  Feels like  food is just sitting around her xiphoid process. She is not eating much at a time due to food sitting heavy. Will eat about 1/2 a meal then go back a few hours later to finish it. Taking Nexium BID and Pepcid as needed at night. This keeps her reflux well controlled. Denies any breakthrough symptoms. Sometimes, she feels her food will hang in her chest for a while. Once a week to a few times a week. States it doesn't matter what she eats. Sometimes this occurs even with liquids.   Having several BMs daily. 2-3 a day. Small. Soft and mushy. Little pieces about an inch or two. Hasn't taken imodium in 1 month. Not taking Linzess. Linzess caused diarrhea at times. No blood in the stool. No black stools.   See cardiology tomorrow.   Denies fever, chills, lightheadedness, dizziness, or feeling like she will pass out.    Past Medical History:  Diagnosis Date  . Blood transfusion without reported diagnosis   . CAD (coronary artery disease)    Nonobstructive at cardiac catheterization 2000  . Cataract   . Cervical cancer ([place]) 1978  . CHF (congestive heart failure) ([place])   . Chronic back pain   . Chronic kidney disease   . COPD (chronic obstructive pulmonary disease) ([place])   . Degenerative disc disease   . Essential hypertension, benign   . GERD (gastroesophageal reflux disease)   . Gout   . Hiatal hernia 07/27/2013  . History of diverticulitis of colon   . History of hiatal hernia   . Iron deficiency anemia   . Irritable bowel syndrome   . Lumbar radiculopathy   . Mixed hyperlipidemia   . Neuropathy   . Osteoporosis   . Ovarian cancer [facility]) 1978   patient denies. States this was cervical cancer  . Oxygen deficiency   . Sleep apnea   . Type 2 diabetes mellitus ([place])   . Vitamin B deficiency 12/25/2009  . Vitamin B12 deficiency     Past Surgical History:  Procedure Laterality Date  . ABDOMINAL HYSTERECTOMY    . BACK SURGERY    . Benign breast cysts    . CHOLECYSTECTOMY    .  COLONOSCOPY  10/01/2006   SLF:Pan colonic diverticulosis and moderate internal hemorrhoids/ Otherwise no polyps, masses, inflammatory changes or AVMs/  . COLONOSCOPY  2011   SLF: pancolonic diverticulosis, large internal hemorrhoids  . COLONOSCOPY N/A 01/26/2016   Procedure: COLONOSCOPY;  Surgeon: [name], MD;  Location: [facility] SUITE;  Service: Endoscopy;  Laterality: N/A;  830   . ESOPHAGOGASTRODUODENOSCOPY  11/19/2006   SLF: Large hiatal hernia without evidence of [place] ulcers/. Distal esophageal stricture, which allowed the gastroscope to pass without resistance.  A 16 mm Savary later passed with mild resistance/ Normal stomach.sb bx negative  . ESOPHAGOGASTRODUODENOSCOPY  10/01/2006   WIO:XBDZH hiatal hernia.  Distal esophagus without evidence of   erythema, ulceration or Barrett's esophagus  . ESOPHAGOGASTRODUODENOSCOPY  2011   SLF: large hh, distal esophageal web narrowing to 64mm s/p dilation to 66mm  . ESOPHAGOGASTRODUODENOSCOPY N/A 08/06/2013   SLF: 1. Stricture at the gastroesophageal junction 2. large hiatal hernia. 3. Mild erosive gastritis.  Marland Kitchen  GIVENS CAPSULE STUDY N/A 08/06/2013   INCOMPLETE-SMALL BOWLE ULCERS  . KNEE SURGERY Right   . PARTIAL HYSTERECTOMY  1978  . small bowel capsule  2008   negative  . SPINE SURGERY    . TONSILLECTOMY AND ADENOIDECTOMY    . Two back surgeries/fusion    . UMBILICAL HERNIA REPAIR  2010    Current Outpatient Medications  Medication Sig Dispense Refill  . allopurinol (ZYLOPRIM) 100 MG tablet Take 1 tablet by mouth 2 (two) times daily.    Marland Kitchen amitriptyline (ELAVIL) 100 MG tablet Take 100 mg by mouth at bedtime.      Marland Kitchen amLODipine (NORVASC) 5 MG tablet Take 1 tablet (5 mg total) by mouth daily. 90 tablet 3  . aspirin EC 81 MG tablet Take 81 mg by mouth daily.    Marland Kitchen atorvastatin (LIPITOR) 40 MG tablet Take 1 tablet (40 mg total) by mouth at bedtime. 90 tablet 1  . esomeprazole (NEXIUM) 40 MG capsule TAKE 1 CAPSULE TWICE A DAY 30 MINUTES  PRIOR TO MEALS (Patient taking differently: Take 40 mg by mouth 2 (two) times daily as needed. ) 180 capsule 1  . famotidine (PEPCID) 40 MG tablet TAKE 1 TABLET AT BEDTIME AS NEEDED TO CONTROL REFLUX (Patient taking differently: Take 40 mg by mouth at bedtime as needed for heartburn or indigestion. ) 90 tablet 1  . furosemide (LASIX) 20 MG tablet Take 2 tablets (40 mg total) by mouth daily. Take 80 mg alternating with 60 mg every other day 30 tablet 2  . hydrALAZINE (APRESOLINE) 25 MG tablet Take 1 tablet (25 mg total) by mouth 2 (two) times daily. 60 tablet 6  . insulin lispro (HUMALOG) 100 UNIT/ML KiwkPen Inject 12-18 Units into the skin See admin instructions. Uses sliding scale and injects with meals.  Least she injects is 12 units. Most is 18 units.    Marland Kitchen LANTUS SOLOSTAR 100 UNIT/ML SOPN Inject 16 Units into the skin at bedtime.     Marland Kitchen loperamide (IMODIUM) 2 MG capsule Take 2 mg by mouth as needed for diarrhea or loose stools.    . meclizine (ANTIVERT) 25 MG tablet Take 1 tablet (25 mg total) by mouth 3 (three) times daily as needed for dizziness. 30 tablet 0  . metoprolol succinate (TOPROL-XL) 50 MG 24 hr tablet Take 75 mg by mouth daily. Take with or immediately following a meal. 135 tablet 1  . spironolactone (ALDACTONE) 50 MG tablet Take 50 mg by mouth daily.     No current facility-administered medications for this visit.     Allergies as of 05/31/2019  . (No Known Allergies)    Family History  Problem Relation Age of Onset  . Colon cancer Brother        diagnosed age 76. Living.   Marland Kitchen Ulcers Sister   . Diabetes Sister   . Heart attack Sister   . Kidney failure Sister   . Stroke Sister   . Ulcers Mother   . Diabetes Mother   . Heart attack Mother   . Stroke Mother   . Asthma Mother   . Heart disease Mother   . Cervical cancer Mother   . Heart attack Brother   . Heart disease Brother   . Asthma Sister   . Diabetes Brother   . Stroke Maternal Grandmother   . Heart attack  Maternal Grandmother   . Heart attack Other   . Early death Father        MVA in his  46s    Social History   Socioeconomic History  . Marital status: Widowed    Spouse name: Stefanik  . Number of children: 4  . Years of education: Not on file  . Highest education level: 10th grade  Occupational History  . 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 Financial resource strain: Not on file  . Food insecurity    Worry: Not on file    Inability: Not on file  . Transportation needs    Medical: Not on file    Non-medical: Not on file  Tobacco Use  . Smoking status: Former Smoker    Packs/day: 1.00    Years: 1.00    Pack years: 1.00    Types: Cigarettes    Start date: 02/19/1961    Quit date: 08/05/1961    Years since quitting: 57.8  . Smokeless tobacco: Never Used  . Tobacco comment: Quit smoking x 50 years  Substance and Sexual Activity  . Alcohol use: No  . Drug use: No  . Sexual activity: Not Currently  Lifestyle  . Physical activity    Days per week: Not on file    Minutes per session: Not on file  . Stress: Not on file  Relationships  . Social Herbalist on phone: Not on file    Gets together: Not on file    Attends religious service: Not on file    Active member of club or organization: Not on file    Attends meetings of clubs or organizations: Not on file    Relationship status: Not on file  Other Topics Concern  . Not on file  Social History Narrative   HAS 4 SON-GRAND [place]man.   Lives in home with Sane - married 13 Y   Cook, sew, quilt, chochet    Review of Systems: Gen: See HPI  CV: Mild chest discomfort at times. Occasional fluttering. This is chronic.  Resp: See HPI GI: See HPI Derm: Denies rash Heme: Denies bruising, bleeding   Physical Exam: BP (!) 147/77   Pulse 79   Temp (!) 96.9 F (36.1 C) (Oral)   Ht 4\' 8"  (1.422 m)   Wt 248 lb 12.8 oz (112.9 kg)   BMI 55.78 kg/m  General:    Alert and oriented. Somewhat ill appearing. Pleasant and cooperative.  Head:  Normocephalic and atraumatic. Eyes:  Conjuctiva clear without scleral icterus. Heart:  S1, S2 present without murmurs appreciated. Lungs:  Clear to auscultation bilaterally. No wheezes, rales, or rhonchi. Gets short of breath when talking a lot during visit. Non productive cough during exam.  Abdomen:  +BS, significant abdominal distension, abdomen is tight, pitting edema present, mild erythema of the skin greatest around the umbilicus and mid lower abdomen, significant tenderness to palpation in mid and lower abdomen, mild tenderness in the upper abdomen. Anasarca extends around to patients back.  Msk:  Symmetrical without gross deformities. Normal posture. Extremities:  Pitting edema up to patients hips.   Neurologic:  Alert and  oriented x4 Psych:. Normal mood and affect.

## 2019-05-31 ENCOUNTER — Inpatient Hospital Stay (HOSPITAL_COMMUNITY)
Admission: EM | Admit: 2019-05-31 | Discharge: 2019-06-08 | DRG: 291 | Disposition: A | Discharge status: Home / Self Care | Payer: Medicare Other | Attending: Internal Medicine | Admitting: Internal Medicine

## 2019-05-31 ENCOUNTER — Encounter: Payer: Self-pay | Admitting: Gastroenterology

## 2019-05-31 ENCOUNTER — Encounter (HOSPITAL_COMMUNITY): Payer: Self-pay

## 2019-05-31 ENCOUNTER — Ambulatory Visit (INDEPENDENT_AMBULATORY_CARE_PROVIDER_SITE_OTHER): Payer: Medicare Other | Admitting: Gastroenterology

## 2019-05-31 ENCOUNTER — Emergency Department (HOSPITAL_COMMUNITY): Payer: Medicare Other

## 2019-05-31 ENCOUNTER — Other Ambulatory Visit: Payer: Self-pay

## 2019-05-31 VITALS — BP 147/77 | HR 79 | Temp 96.9°F | Ht <= 58 in | Wt 248.8 lb

## 2019-05-31 DIAGNOSIS — J9622 Acute and chronic respiratory failure with hypercapnia: Secondary | ICD-10-CM | POA: Diagnosis present

## 2019-05-31 DIAGNOSIS — N184 Chronic kidney disease, stage 4 (severe): Secondary | ICD-10-CM | POA: Diagnosis present

## 2019-05-31 DIAGNOSIS — R1084 Generalized abdominal pain: Secondary | ICD-10-CM | POA: Insufficient documentation

## 2019-05-31 DIAGNOSIS — K589 Irritable bowel syndrome without diarrhea: Secondary | ICD-10-CM | POA: Diagnosis present

## 2019-05-31 DIAGNOSIS — Z841 Family history of disorders of kidney and ureter: Secondary | ICD-10-CM

## 2019-05-31 DIAGNOSIS — N179 Acute kidney failure, unspecified: Secondary | ICD-10-CM | POA: Diagnosis present

## 2019-05-31 DIAGNOSIS — N1832 Chronic kidney disease, stage 3b: Secondary | ICD-10-CM

## 2019-05-31 DIAGNOSIS — J441 Chronic obstructive pulmonary disease with (acute) exacerbation: Secondary | ICD-10-CM

## 2019-05-31 DIAGNOSIS — R601 Generalized edema: Secondary | ICD-10-CM | POA: Diagnosis not present

## 2019-05-31 DIAGNOSIS — E66813 Obesity, class 3: Secondary | ICD-10-CM

## 2019-05-31 DIAGNOSIS — K2970 Gastritis, unspecified, without bleeding: Secondary | ICD-10-CM | POA: Diagnosis present

## 2019-05-31 DIAGNOSIS — I130 Hypertensive heart and chronic kidney disease with heart failure and stage 1 through stage 4 chronic kidney disease, or unspecified chronic kidney disease: Secondary | ICD-10-CM | POA: Diagnosis not present

## 2019-05-31 DIAGNOSIS — N183 Chronic kidney disease, stage 3 unspecified: Secondary | ICD-10-CM | POA: Diagnosis present

## 2019-05-31 DIAGNOSIS — J449 Chronic obstructive pulmonary disease, unspecified: Secondary | ICD-10-CM | POA: Diagnosis not present

## 2019-05-31 DIAGNOSIS — J9811 Atelectasis: Secondary | ICD-10-CM | POA: Diagnosis present

## 2019-05-31 DIAGNOSIS — I5033 Acute on chronic diastolic (congestive) heart failure: Secondary | ICD-10-CM | POA: Diagnosis not present

## 2019-05-31 DIAGNOSIS — J9611 Chronic respiratory failure with hypoxia: Secondary | ICD-10-CM | POA: Diagnosis present

## 2019-05-31 DIAGNOSIS — Z87891 Personal history of nicotine dependence: Secondary | ICD-10-CM

## 2019-05-31 DIAGNOSIS — E662 Morbid (severe) obesity with alveolar hypoventilation: Secondary | ICD-10-CM | POA: Diagnosis present

## 2019-05-31 DIAGNOSIS — Z8049 Family history of malignant neoplasm of other genital organs: Secondary | ICD-10-CM

## 2019-05-31 DIAGNOSIS — E538 Deficiency of other specified B group vitamins: Secondary | ICD-10-CM | POA: Diagnosis present

## 2019-05-31 DIAGNOSIS — Z794 Long term (current) use of insulin: Secondary | ICD-10-CM

## 2019-05-31 DIAGNOSIS — Z9049 Acquired absence of other specified parts of digestive tract: Secondary | ICD-10-CM

## 2019-05-31 DIAGNOSIS — R110 Nausea: Secondary | ICD-10-CM

## 2019-05-31 DIAGNOSIS — E872 Acidosis: Secondary | ICD-10-CM | POA: Diagnosis present

## 2019-05-31 DIAGNOSIS — E875 Hyperkalemia: Secondary | ICD-10-CM | POA: Diagnosis present

## 2019-05-31 DIAGNOSIS — K219 Gastro-esophageal reflux disease without esophagitis: Secondary | ICD-10-CM | POA: Diagnosis present

## 2019-05-31 DIAGNOSIS — Z20828 Contact with and (suspected) exposure to other viral communicable diseases: Secondary | ICD-10-CM | POA: Diagnosis present

## 2019-05-31 DIAGNOSIS — Z6841 Body Mass Index (BMI) 40.0 and over, adult: Secondary | ICD-10-CM

## 2019-05-31 DIAGNOSIS — G4733 Obstructive sleep apnea (adult) (pediatric): Secondary | ICD-10-CM

## 2019-05-31 DIAGNOSIS — Z90710 Acquired absence of both cervix and uterus: Secondary | ICD-10-CM

## 2019-05-31 DIAGNOSIS — M810 Age-related osteoporosis without current pathological fracture: Secondary | ICD-10-CM | POA: Diagnosis present

## 2019-05-31 DIAGNOSIS — I2510 Atherosclerotic heart disease of native coronary artery without angina pectoris: Secondary | ICD-10-CM | POA: Diagnosis present

## 2019-05-31 DIAGNOSIS — J4489 Other specified chronic obstructive pulmonary disease: Secondary | ICD-10-CM | POA: Diagnosis present

## 2019-05-31 DIAGNOSIS — Z860100 Personal history of colon polyps, unspecified: Secondary | ICD-10-CM | POA: Insufficient documentation

## 2019-05-31 DIAGNOSIS — J9621 Acute and chronic respiratory failure with hypoxia: Secondary | ICD-10-CM | POA: Diagnosis present

## 2019-05-31 DIAGNOSIS — E1121 Type 2 diabetes mellitus with diabetic nephropathy: Secondary | ICD-10-CM

## 2019-05-31 DIAGNOSIS — Z833 Family history of diabetes mellitus: Secondary | ICD-10-CM

## 2019-05-31 DIAGNOSIS — G8929 Other chronic pain: Secondary | ICD-10-CM | POA: Diagnosis present

## 2019-05-31 DIAGNOSIS — R1310 Dysphagia, unspecified: Secondary | ICD-10-CM

## 2019-05-31 DIAGNOSIS — K449 Diaphragmatic hernia without obstruction or gangrene: Secondary | ICD-10-CM | POA: Diagnosis present

## 2019-05-31 DIAGNOSIS — Z8249 Family history of ischemic heart disease and other diseases of the circulatory system: Secondary | ICD-10-CM

## 2019-05-31 DIAGNOSIS — N186 End stage renal disease: Secondary | ICD-10-CM | POA: Diagnosis present

## 2019-05-31 DIAGNOSIS — R0600 Dyspnea, unspecified: Secondary | ICD-10-CM | POA: Diagnosis present

## 2019-05-31 DIAGNOSIS — Z79899 Other long term (current) drug therapy: Secondary | ICD-10-CM

## 2019-05-31 DIAGNOSIS — E1140 Type 2 diabetes mellitus with diabetic neuropathy, unspecified: Secondary | ICD-10-CM | POA: Diagnosis present

## 2019-05-31 DIAGNOSIS — D631 Anemia in chronic kidney disease: Secondary | ICD-10-CM | POA: Diagnosis present

## 2019-05-31 DIAGNOSIS — R0689 Other abnormalities of breathing: Secondary | ICD-10-CM | POA: Diagnosis present

## 2019-05-31 DIAGNOSIS — Z9119 Patient's noncompliance with other medical treatment and regimen: Secondary | ICD-10-CM

## 2019-05-31 DIAGNOSIS — Z7982 Long term (current) use of aspirin: Secondary | ICD-10-CM

## 2019-05-31 DIAGNOSIS — M109 Gout, unspecified: Secondary | ICD-10-CM | POA: Diagnosis present

## 2019-05-31 DIAGNOSIS — E782 Mixed hyperlipidemia: Secondary | ICD-10-CM | POA: Diagnosis present

## 2019-05-31 DIAGNOSIS — E1122 Type 2 diabetes mellitus with diabetic chronic kidney disease: Secondary | ICD-10-CM | POA: Diagnosis present

## 2019-05-31 DIAGNOSIS — Z8541 Personal history of malignant neoplasm of cervix uteri: Secondary | ICD-10-CM

## 2019-05-31 DIAGNOSIS — K5900 Constipation, unspecified: Secondary | ICD-10-CM

## 2019-05-31 LAB — LIPASE, BLOOD: Lipase: 38 U/L (ref 11–51)

## 2019-05-31 LAB — CBC
HCT: 39.9 % (ref 36.0–46.0)
Hemoglobin: 11.8 g/dL — ABNORMAL LOW (ref 12.0–15.0)
MCH: 26.9 pg (ref 26.0–34.0)
MCHC: 29.6 g/dL — ABNORMAL LOW (ref 30.0–36.0)
MCV: 91.1 fL (ref 80.0–100.0)
Platelets: 298 10*3/uL (ref 150–400)
RBC: 4.38 MIL/uL (ref 3.87–5.11)
RDW: 14.4 % (ref 11.5–15.5)
WBC: 8.8 10*3/uL (ref 4.0–10.5)
nRBC: 0 % (ref 0.0–0.2)

## 2019-05-31 LAB — COMPREHENSIVE METABOLIC PANEL
ALT: 10 U/L (ref 0–44)
AST: 14 U/L — ABNORMAL LOW (ref 15–41)
Albumin: 3.8 g/dL (ref 3.5–5.0)
Alkaline Phosphatase: 71 U/L (ref 38–126)
Anion gap: 8 (ref 5–15)
BUN: 27 mg/dL — ABNORMAL HIGH (ref 8–23)
CO2: 27 mmol/L (ref 22–32)
Calcium: 8.6 mg/dL — ABNORMAL LOW (ref 8.9–10.3)
Chloride: 104 mmol/L (ref 98–111)
Creatinine, Ser: 1.64 mg/dL — ABNORMAL HIGH (ref 0.44–1.00)
GFR calc Af Amer: 35 mL/min — ABNORMAL LOW (ref >60)
GFR calc non Af Amer: 30 mL/min — ABNORMAL LOW (ref >60)
Glucose, Bld: 131 mg/dL — ABNORMAL HIGH (ref 70–99)
Potassium: 5.6 mmol/L — ABNORMAL HIGH (ref 3.5–5.1)
Sodium: 139 mmol/L (ref 135–145)
Total Bilirubin: 1 mg/dL (ref 0.3–1.2)
Total Protein: 7.6 g/dL (ref 6.5–8.1)

## 2019-05-31 LAB — BRAIN NATRIURETIC PEPTIDE: B Natriuretic Peptide: 206 pg/mL — ABNORMAL HIGH (ref 0.0–100.0)

## 2019-05-31 MED ORDER — FUROSEMIDE 10 MG/ML IJ SOLN
80.0000 mg | Freq: Once | INTRAMUSCULAR | Status: AC
  Administered 2019-05-31: 80 mg via INTRAVENOUS
  Filled 2019-05-31: qty 8

## 2019-05-31 MED ORDER — SODIUM ZIRCONIUM CYCLOSILICATE 5 G PO PACK
5.0000 g | PACK | Freq: Once | ORAL | Status: AC
  Administered 2019-05-31: 5 g via ORAL
  Filled 2019-05-31: qty 1

## 2019-05-31 NOTE — Assessment & Plan Note (Addendum)
73 year old female with past medical history of colon polyps.  She is currently doing for repeat colonoscopy.  Last colonoscopy on 01/26/2016 with pancolonic diverticulosis, 3 colorectal polyps, moderately redundant colon, nonbleeding external and internal hemorrhoids, loose/watery stools likely secondary to IBS-C with overflow incontinence.  Pathology with 2 tubular adenomas and one benign polyp.  Recommended repeat in 3 years.  Lower GI symptoms include 2-3 bowel movements daily with stools that are small, soft/mushy pieces about an inch or too long.  Suspect this is likely overflow.  Not currently taking Linzess.  Denies bright red blood per rectum or melena.  Brother with history of colon cancer diagnosed at age 85.  Patient needs colonoscopy in the near future for colon cancer surveillance however, she has generalized swelling with anasarca, abdominal distention, generalized abdominal pain, and some worsening shortness of breath as addressed above which needs to be addressed prior to scheduling her colonoscopy.  We will also need to discuss scheduling procedures with Dr. Oneida Alar as patient's BMI is greater than 43. Follow-up in 4-6 weeks.

## 2019-05-31 NOTE — Assessment & Plan Note (Addendum)
Dysphagia occurring about once a week to few times a week.  States it does not matter what she eats.  Sometimes occurs with liquids. Feels food hangs in her mid chest at times. Also with sensation of food sitting around her xiphoid process for a while. GERD symptoms are well controlled.  Known large hiatal hernia.  Last EGD in 2015 with stricture at the GE junction s/p dilation, large hiatal hernia, mild erosive gastritis, pathology with reactive gastropathy with slight inflammation.  She now has acute worsening of anasarca, abdominal distention, and generalized abdominal pain over the last 3 weeks as addressed below.  Also with postprandial nausea without vomiting and worsening shortness of breath over the last week.  She had CT abdomen and pelvis without contrast on 05/25/2019 that revealed moderate to large hiatal hernia with wall thickening.  This was present on CT in 2016 as well favoring a benign etiology.  Suspect sensation of food sitting around her xiphoid process is likely related to her large hiatal hernia.  Cannot rule out esophageal stricture, web, ring, or malignancy. Advise she continue eating small frequent meals throughout the day and ensure she remains upright after eating. Continue Nexium twice daily minutes before meals and Pepcid nightly as needed. Patient will need EGD with possible dilation of her esophagus for dysphagia and further evaluation of abdominal wall thickening noted on CT scan.  However, with acute worsening of anasarca, abdominal distention, abdominal pain, and shortness of breath, I am sending patient to the ED for further evaluation.  This needs to be addressed prior to scheduling her EGD.  We will also need to discuss with Dr. Oneida Alar as patient's BMI is over 40.  She may need procedure at [facility].  Follow-up in 4-6 weeks.

## 2019-05-31 NOTE — Assessment & Plan Note (Signed)
Addressed under generalized abdominal pain

## 2019-05-31 NOTE — Assessment & Plan Note (Signed)
GERD symptoms are well controlled on Nexium twice daily in Pepcid nightly as needed.    Continue Nexium 40 mg 30 minutes before meals. Continue Pepcid nightly as needed.

## 2019-05-31 NOTE — Assessment & Plan Note (Addendum)
73 year old female with past medical history significant for CKD, COPD, CHF, IDA, HTN, hiatal hernia, GERD, and IBS with constipation diarrhea who presents with worsening generalized swelling, and generalized abdominal pain over the last 3 weeks.  Associated with postprandial nausea without vomiting and worsening respiratory status over the last week. Also feeling like her food is just sitting around her xiphoid process, sometimes hanging in mid chest.  50 pound weight gain in the last 2 months. GERD symptoms are well controlled on Nexium twice daily and Pepcid nightly. CT abdomen and pelvis without contrast on 05/25/2019 significant for moderate to large hiatal hernia with chronic wall thickening of the herniated stomach suspicious for gastritis, pelvic floor laxity suggestive of rectal prolapse, anasarca significantly progressed, and bilateral renal atrophy.  Liver, pancreas, and spleen appeared normal.  Originally patient had been advised to increase her Lasix to 80 mg daily however anasarca did not improve.  Most recently Lasix has been decreased to 40 mg and spironolactone 50 mg added on 05/27/2019.  So far, no significant improvement in symptoms.  Feels she is not urinating much.  Denies bright red blood per rectum or melena. Exam with generalized anasarca, tight distended abdomen with pitting edema, significant tenderness to palpation in the mid and lower abdomen, mild erythema around the umbilicus and mid lower abdomen.  I do not feel her significant anasarca is related to an underlying GI etiology as her liver appeared normal on CT.  No recent labs on file to check LFTs or albumin. Concern for possible acute on chronic CHF with her worsening shortness of breath.  She does have follow-up with cardiology tomorrow; however, I feel patient would be best served by going to the emergency department for further evaluation.  Will need outpatient EGD with possible dilation for dysphagia and gastric wall  thickening noted on CT.  Will have to discuss this with Dr. Oneida Alar as patient's BMI is greater than 45.  She may need to have procedure at [facility].  Suspect patient's postprandial nausea and fullness after eating at her xiphoid process is likely related to her large hiatal hernia in the setting of significant increased abdominal pressure secondary to anasarca.  Advise she continue to eat small frequent meals throughout the day to allow emptying of her hiatal hernia and ensure she remains upright after eating. Follow-up in 4-6 weeks.

## 2019-05-31 NOTE — Assessment & Plan Note (Signed)
Dress tender generalized abdominal pain

## 2019-05-31 NOTE — ED Triage Notes (Addendum)
Pt reports that swelling in her abdomen has gradually increased over the past 3 weeks. Also noted swelling in ankles. SOB with exertion. Pt seen at [facility] gastro and sent to ED

## 2019-05-31 NOTE — Assessment & Plan Note (Signed)
Patient has history of IBS with constipation and diarrhea.  She is now having more trouble with constipation with 2-3 BMs daily with stools that aresmall soft/mushy pieces about an inch or too long.  Denies bright red blood per rectum or melena.  She does have abdominal pain associated with significant anasarca and nausea as described below. She is not taking Linzess at this time.  Has not taken Imodium in about 1 month.  Reports Linzess causes diarrhea at times.  Considered starting Amitiza; however, with patient's current nausea with meals, I am hesitant to start Amitiza as this to could cause nausea if she is unable to eat well.  Will provide samples of Linzess 72 mcg for patient to try again.  She is to call me in 2 weeks and let me know how Linzess worked.  We will send in prescription if good results.  If not, will try Amitiza.

## 2019-05-31 NOTE — Patient Instructions (Addendum)
Please proceed to [facility] Emergency Department. I have called to let them know you are on the way.   I am providing you with Linzess samples today for your constipation. Take this once daily for constipation. Please call me and let me know how this works for you. If it works well, I will send in a prescription. If not, we can try something different.   Follow-up in 4-6 weeks.   Aliene Altes, PA-C [facility]

## 2019-05-31 NOTE — ED Provider Notes (Signed)
Emergency Department Provider Note   I have reviewed the triage vital signs and the nursing notes.   HISTORY  Chief Complaint Abdominal Pain   HPI [name] is a 73 y.o. female with past medical history reviewed below presents to the emergency department with worsening lower extremity edema now with abdominal distention.  She was sent from her GI doctor's office who follows her for her hiatal hernia.  She noted significant lower extremity swelling and intermittent abdominal discomfort with distention and suspected anasarca type presentation.  Patient is having worsening shortness of breath.  She notes occasional chest pain but none currently.  No heart palpitations.  She was recently on 80 mg of Lasix but that was cut down to 40 mg with spironolactone added within the last week.  Patient states that her urine output has decreased and she feels like she is developed more fluid retention.     Past Medical History:  Diagnosis Date   Blood transfusion without reported diagnosis    CAD (coronary artery disease)    Nonobstructive at cardiac catheterization 2000   Cataract    Cervical cancer ([place]) 1978   CHF (congestive heart failure) (HCC)    Chronic back pain    Chronic kidney disease    COPD (chronic obstructive pulmonary disease) (HCC)    Degenerative disc disease    Essential hypertension, benign    GERD (gastroesophageal reflux disease)    Gout    Hiatal hernia 07/27/2013   History of diverticulitis of colon    History of hiatal hernia    Iron deficiency anemia    Irritable bowel syndrome    Lumbar radiculopathy    Mixed hyperlipidemia    Neuropathy    Osteoporosis    Ovarian cancer ([place]) 1978   patient denies. States this was cervical cancer   Oxygen deficiency    Sleep apnea    Type 2 diabetes mellitus ([place])    Vitamin B deficiency 12/25/2009   Vitamin B12 deficiency     Patient Active Problem List   Diagnosis Date Noted    Anasarca 05/31/2019   Nausea without vomiting 05/31/2019   Generalized abdominal pain 05/31/2019   History of colonic polyps 05/31/2019   Lymphedema of both lower extremities 04/28/2019   Chronic respiratory failure with hypoxia ([place]) 07/13/2018   Acute renal failure superimposed on stage 3 chronic kidney disease ([place]) 07/13/2018   Chronic diastolic CHF (congestive heart failure) ([place]) 07/13/2018   COPD (chronic obstructive pulmonary disease) ([place]) 07/13/2018   COPD exacerbation ([place]) 07/12/2018   Flatulence 09/18/2017   IBS (irritable bowel syndrome) 04/03/2017   Gout 03/15/2017   Acute on chronic diastolic CHF (congestive heart failure) ([place]) 05/08/2016   CKD (chronic kidney disease) stage 3, GFR 30-59 ml/min 05/08/2016   Constipation 04/25/2016   Peripheral vertigo 06/27/2015   Port-A-Cath in place 10/06/2014   Chronic obstructive pulmonary disease ([place]) 04/30/2014   Morbid (severe) obesity due to excess calories ([place]) 04/30/2014   Hemorrhoids, internal, with bleeding 11/10/2013   Anemia 08/18/2013   Acid reflux    Obstructive chronic bronchitis without exacerbation ([place]) 12/26/2009   Dysphagia 12/26/2009   DM type 2 with diabetic peripheral neuropathy ([place]) 12/25/2009   Vitamin B deficiency 12/25/2009   Iron deficiency anemia due to chronic blood loss 12/25/2009   Benign essential HTN 12/25/2009   DEGENERATIVE [place] DISEASE 12/25/2009   OSA on CPAP 12/25/2009   HLD (hyperlipidemia) 12/25/2009   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  Past Surgical History:  Procedure Laterality Date   ABDOMINAL HYSTERECTOMY     BACK SURGERY     Benign breast cysts     CHOLECYSTECTOMY     COLONOSCOPY  10/01/2006   SLF:Pan colonic diverticulosis and moderate internal hemorrhoids/ Otherwise no polyps, masses, inflammatory changes or AVMs/   COLONOSCOPY  2011   SLF: pancolonic diverticulosis, large internal hemorrhoids   COLONOSCOPY N/A  01/26/2016   Procedure: COLONOSCOPY;  Surgeon: [name], MD;  Location: [facility];  Service: Endoscopy;  Laterality: N/A;  830    ESOPHAGOGASTRODUODENOSCOPY  11/19/2006   SLF: Large hiatal hernia without evidence of Cameron ulcers/. Distal esophageal stricture, which allowed the gastroscope to pass without resistance.  A 16 mm Savary later passed with mild resistance/ Normal stomach.sb bx negative   ESOPHAGOGASTRODUODENOSCOPY  10/01/2006   UMP:NTIRW hiatal hernia.  Distal esophagus without evidence of   erythema, ulceration or Barrett's esophagus   ESOPHAGOGASTRODUODENOSCOPY  2011   SLF: large hh, distal esophageal web narrowing to 32mm s/p dilation to 45mm   ESOPHAGOGASTRODUODENOSCOPY N/A 08/06/2013   SLF: 1. Stricture at the gastroesophageal junction 2. large hiatal hernia. 3. Mild erosive gastritis.   GIVENS CAPSULE STUDY N/A 08/06/2013   INCOMPLETE-SMALL BOWLE ULCERS   KNEE SURGERY Right    PARTIAL HYSTERECTOMY  1978   small bowel capsule  2008   negative   SPINE SURGERY     TONSILLECTOMY AND ADENOIDECTOMY     Two back surgeries/fusion     UMBILICAL HERNIA REPAIR  2010    Allergies Patient has no known allergies.  Family History  Problem Relation Age of Onset   Colon cancer Brother        diagnosed age 53. Living.    Ulcers Sister    Diabetes Sister    Heart attack Sister    Kidney failure Sister    Stroke Sister    Ulcers Mother    Diabetes Mother    Heart attack Mother    Stroke Mother    Asthma Mother    Heart disease Mother    Cervical cancer Mother    Heart attack Brother    Heart disease Brother    Asthma Sister    Diabetes Brother    Stroke Maternal Grandmother    Heart attack Maternal Grandmother    Heart attack Other    Early death Father        MVA in his 50s    Social History Social History   Tobacco Use   Smoking status: Former Smoker    Packs/day: 1.00    Years: 1.00    Pack years: 1.00    Types:  Cigarettes    Start date: 02/19/1961    Quit date: 08/05/1961    Years since quitting: 57.8   Smokeless tobacco: Never Used   Tobacco comment: Quit smoking x 50 years  Substance Use Topics   Alcohol use: No   Drug use: No    Review of Systems  Constitutional: No fever/chills Eyes: No visual changes. ENT: No sore throat. Cardiovascular: Denies chest pain. Positive LE swelling.  Respiratory: Positive shortness of breath. Gastrointestinal: Positive abdominal pain/distension.  No nausea, no vomiting.  No diarrhea.  No constipation. Genitourinary: Negative for dysuria. Musculoskeletal: Negative for back pain. Skin: Negative for rash. Neurological: Negative for headaches, focal weakness or numbness.  10-point ROS otherwise negative.  ____________________________________________   PHYSICAL EXAM:  VITAL SIGNS: ED Triage Vitals  Enc Vitals Group     BP  05/31/19 1559 (!) 147/67     Pulse Rate 05/31/19 1559 74     Resp 05/31/19 1559 (!) 24     Temp 05/31/19 1559 97.8 F (36.6 C)     Temp Source 05/31/19 1559 Oral     SpO2 05/31/19 1559 93 %     Weight 05/31/19 1600 248 lb 12.8 oz (112.9 kg)     Height 05/31/19 1600 4\' 8"  (1.422 m)   Constitutional: Alert and oriented. Well appearing and in no acute distress. Eyes: Conjunctivae are normal.  Head: Atraumatic. Nose: No congestion/rhinnorhea. Mouth/Throat: Mucous membranes are moist.  Neck: No stridor.  Cardiovascular: Normal rate, regular rhythm. Good peripheral circulation. Grossly normal heart sounds. Mild pitting edema in the lower abdomen.    Respiratory: Increased respiratory effort.  No retractions. Lungs with crackles at the bases.  Gastrointestinal: Soft with mild diffuse tenderness. No distention.  Musculoskeletal: No lower extremity tenderness with 3+ pitting edema bilaterally. No gross deformities of extremities. Neurologic:  Normal speech and language. No gross focal neurologic deficits are appreciated.  Skin:   Skin is warm, dry and intact. No rash noted.  ____________________________________________   LABS (all labs ordered are listed, but only abnormal results are displayed)  Labs Reviewed  COMPREHENSIVE METABOLIC PANEL - Abnormal; Notable for the following components:      Result Value   Potassium 5.6 (*)    Glucose, Bld 131 (*)    BUN 27 (*)    Creatinine, Ser 1.64 (*)    Calcium 8.6 (*)    AST 14 (*)    GFR calc non Af Amer 30 (*)    GFR calc Af Amer 35 (*)    All other components within normal limits  CBC - Abnormal; Notable for the following components:   Hemoglobin 11.8 (*)    MCHC 29.6 (*)    All other components within normal limits  URINALYSIS, ROUTINE W REFLEX MICROSCOPIC - Abnormal; Notable for the following components:   Color, Urine STRAW (*)    All other components within normal limits  BRAIN NATRIURETIC PEPTIDE - Abnormal; Notable for the following components:   B Natriuretic Peptide 206.0 (*)    All other components within normal limits  BLOOD GAS, ARTERIAL - Abnormal; Notable for the following components:   pH, Arterial 7.243 (*)    pCO2 arterial 73.3 (*)    pO2, Arterial 117 (*)    Acid-Base Excess 3.7 (*)    All other components within normal limits  CBG MONITORING, ED - Abnormal; Notable for the following components:   Glucose-Capillary 115 (*)    All other components within normal limits  CBG MONITORING, ED - Abnormal; Notable for the following components:   Glucose-Capillary 114 (*)    All other components within normal limits  SARS CORONAVIRUS 2 (TAT 6-24 HRS)  LIPASE, BLOOD   ____________________________________________  EKG   EKG Interpretation  Date/Time:  Monday May 31 2019 22:21:10 EDT Ventricular Rate:  77 PR Interval:    QRS Duration: 104 QT Interval:  375 QTC Calculation: 425 R Axis:   32 Text Interpretation: Sinus rhythm Borderline low voltage, extremity leads Nonspecific T abnormalities, lateral leads Confirmed by Nanda Quinton  [id number]) on 05/31/2019 11:14:51 PM       ____________________________________________  RADIOLOGY  Ct Abdomen Pelvis Wo Contrast  Result Date: 05/31/2019 CLINICAL DATA:  Abdominal distension increasing over the past 3 weeks EXAM: CT ABDOMEN AND PELVIS WITHOUT CONTRAST TECHNIQUE: Multidetector CT imaging of the abdomen and pelvis was  performed following the standard protocol without IV contrast. COMPARISON:  05/25/2019 FINDINGS: Lower chest: 5 mm nodule is noted in the medial aspect of the left lower lobe. A few smaller associated nodules are noted. Large hiatal hernia is seen with almost the entire stomach within the chest. Hepatobiliary: No focal liver abnormality is seen. Status post cholecystectomy. No biliary dilatation. Pancreas: Unremarkable. No pancreatic ductal dilatation or surrounding inflammatory changes. Spleen: Normal in size without focal abnormality. Adrenals/Urinary Tract: Adrenal glands are within normal limits. The kidneys demonstrate no definitive renal calculi or obstructive changes. The bladder is partially decompressed. Stomach/Bowel: Changes of diverticulosis are seen within the colon without evidence of diverticulitis. No obstructive or inflammatory changes are noted. The appendix is within normal limits. No small bowel abnormality is seen. The stomach again demonstrates a large hiatal hernia with wall thickening relatively stable from the prior exam. Vascular/Lymphatic: Aortic atherosclerosis. No enlarged abdominal or pelvic lymph nodes. Reproductive: Status post hysterectomy. No adnexal masses. Other: No abdominal wall hernia or abnormality. No abdominopelvic ascites. Considerable changes of anasarca are again identified within the abdominal wall stable from the prior study. Some laxity of the pelvic floor is noted and stable. Musculoskeletal: Postsurgical and degenerative changes are noted stable from the prior study. IMPRESSION: Large hiatal hernia stable from the prior exam.  Some wall thickening in the stomach is noted suspicious for gastritis. No obstructive changes are noted. Chronic changes similar to that seen on the prior exam. Changes of anasarca stable from the previous study. Electronically Signed   By: Inez Catalina M.D.   On: 05/31/2019 20:34   Dg Chest Portable 1 View  Result Date: 05/31/2019 CLINICAL DATA:  Cough EXAM: PORTABLE CHEST 1 VIEW COMPARISON:  Radiographs 07/12/2018, CT chest 07/16/2006, same-day CT abdomen and pelvis FINDINGS: Irregularity along the right heart border likely corresponds to the large hiatal hernia seen on CT of the abdomen pelvis. Hazy areas of basilar atelectasis are present. Additional increased attenuation lung bases likely related to body habitus. No pneumothorax or visible effusion. A right IJ approach Port-A-Cath tip terminates in the mid SVC. There is cardiomegaly which is similar to slightly increased from prior exam though may be accentuated by the portable technique. Slightly lobular configuration of the heart could also reflect small pericardial effusion. The aorta is tortuous. No acute osseous or soft tissue abnormality. Degenerative changes are present in the imaged spine and shoulders. IMPRESSION: 1. Irregularity along the right heart border likely corresponds to the large hiatal hernia seen on CT of the abdomen pelvis. 2. Bibasilar atelectasis. No other acute cardiopulmonary abnormality. Increased attenuation of the bases likely related to body habitus. 3. Slightly lobular configuration of the heart could reflect the small volume pericardial fluid seen on CT abdomen pelvis. Electronically Signed   By: Lovena Le M.D.   On: 05/31/2019 20:46    ____________________________________________   PROCEDURES  Procedure(s) performed:   Procedures  CRITICAL CARE Performed by: Margette Fast Total critical care time: 35 minutes Critical care time was exclusive of separately billable procedures and treating other  patients. Critical care was necessary to treat or prevent imminent or life-threatening deterioration. Critical care was time spent personally by me on the following activities: development of treatment plan with patient and/or surrogate as well as nursing, discussions with consultants, evaluation of patient's response to treatment, examination of patient, obtaining history from patient or surrogate, ordering and performing treatments and interventions, ordering and review of laboratory studies, ordering and review of radiographic studies, pulse oximetry  and re-evaluation of patient's condition.  Nanda Quinton, MD Emergency Medicine  ____________________________________________   INITIAL IMPRESSION / ASSESSMENT AND PLAN / ED COURSE  Pertinent labs & imaging results that were available during my care of the patient were reviewed by me and considered in my medical decision making (see chart for details).   Patient presents to the emergency department with lower extremity edema and fluid retention.  She has pitting edema into the thighs.  Abdominal distention appears to be related to anasarca type presentation despite being compliant with her home medications including Lasix and spironolactone.  She does have hyperkalemia without acute EKG changes.  No ectopy on monitor.  Plan for [facility] and IV Lasix with inpatient diuresis.  She is followed by cardiology office locally with Dr. Domenic Polite.   CT abdomen/pelvis reviewed with no acute findings. Anasarca noted with no worsening pulmonary edema pattern on x-ray. Patient requiring 2L [place] with O2 sats intermittently in the mid 80s. Question OSA vs edema. Will admit for diuresis in the setting of aggressive outpatient mgmt to this point, CKD, and hyperkalemia.   Discussed patient's case with [facility], Dr. Myna Hidalgo to request admission. Patient and family (if present) updated with plan. Care transferred to [facility] service.  I reviewed all nursing notes, vitals, pertinent old  records, EKGs, labs, imaging (as available).  ____________________________________________  FINAL CLINICAL IMPRESSION(S) / ED DIAGNOSES  Final diagnoses:  Anasarca  Generalized abdominal pain     MEDICATIONS GIVEN DURING THIS VISIT:  Medications  allopurinol (ZYLOPRIM) tablet 100 mg (has no administration in time range)  aspirin EC tablet 81 mg (has no administration in time range)  amLODipine (NORVASC) tablet 5 mg (has no administration in time range)  atorvastatin (LIPITOR) tablet 40 mg (has no administration in time range)  hydrALAZINE (APRESOLINE) tablet 25 mg (25 mg Oral Not Given 06/01/19 0803)  metoprolol succinate (TOPROL-XL) 24 hr tablet 75 mg (has no administration in time range)  amitriptyline (ELAVIL) tablet 100 mg (100 mg Oral Given 06/01/19 0428)  pantoprazole (PROTONIX) EC tablet 40 mg (has no administration in time range)  albuterol (VENTOLIN HFA) 108 (90 Base) MCG/ACT inhaler 2 puff (has no administration in time range)  sodium chloride flush (NS) 0.9 % injection 3 mL (3 mLs Intravenous Not Given 06/01/19 0115)  sodium chloride flush (NS) 0.9 % injection 3 mL (has no administration in time range)  0.9 %  sodium chloride infusion (has no administration in time range)  acetaminophen (TYLENOL) tablet 650 mg (has no administration in time range)  ondansetron (ZOFRAN) injection 4 mg (has no administration in time range)  heparin injection 5,000 Units (5,000 Units Subcutaneous Not Given 06/01/19 0745)  furosemide (LASIX) injection 40 mg (40 mg Intravenous Given 06/01/19 0638)  insulin glargine (LANTUS) injection 8 Units (8 Units Subcutaneous Given 06/01/19 0433)  insulin aspart (novoLOG) injection 0-9 Units (0 Units Subcutaneous Not Given 06/01/19 0803)  fluticasone furoate-vilanterol (BREO ELLIPTA) 100-25 MCG/INH 1 puff (1 puff Inhalation Given 06/01/19 0853)    And  umeclidinium bromide (INCRUSE ELLIPTA) 62.5 MCG/INH 1 puff (1 puff Inhalation Given 06/01/19 0857)   sodium zirconium cyclosilicate (LOKELMA) packet 5 g (5 g Oral Given 05/31/19 2245)  furosemide (LASIX) injection 80 mg (80 mg Intravenous Given 05/31/19 2245)    Note:  This document was prepared using Dragon voice recognition software and may include unintentional dictation errors.  Nanda Quinton, MD, [facility] Emergency Medicine    [place], [place], MD 06/01/19 [phone]

## 2019-06-01 ENCOUNTER — Ambulatory Visit: Payer: 59 | Admitting: Student

## 2019-06-01 ENCOUNTER — Encounter (HOSPITAL_COMMUNITY): Payer: Self-pay | Admitting: Family Medicine

## 2019-06-01 DIAGNOSIS — G8929 Other chronic pain: Secondary | ICD-10-CM | POA: Diagnosis present

## 2019-06-01 DIAGNOSIS — N184 Chronic kidney disease, stage 4 (severe): Secondary | ICD-10-CM | POA: Diagnosis present

## 2019-06-01 DIAGNOSIS — Z6841 Body Mass Index (BMI) 40.0 and over, adult: Secondary | ICD-10-CM | POA: Diagnosis not present

## 2019-06-01 DIAGNOSIS — E875 Hyperkalemia: Secondary | ICD-10-CM | POA: Diagnosis present

## 2019-06-01 DIAGNOSIS — J449 Chronic obstructive pulmonary disease, unspecified: Secondary | ICD-10-CM | POA: Diagnosis present

## 2019-06-01 DIAGNOSIS — E782 Mixed hyperlipidemia: Secondary | ICD-10-CM | POA: Diagnosis present

## 2019-06-01 DIAGNOSIS — M109 Gout, unspecified: Secondary | ICD-10-CM | POA: Diagnosis present

## 2019-06-01 DIAGNOSIS — J9621 Acute and chronic respiratory failure with hypoxia: Secondary | ICD-10-CM | POA: Diagnosis present

## 2019-06-01 DIAGNOSIS — E1122 Type 2 diabetes mellitus with diabetic chronic kidney disease: Secondary | ICD-10-CM | POA: Diagnosis present

## 2019-06-01 DIAGNOSIS — J9811 Atelectasis: Secondary | ICD-10-CM | POA: Diagnosis present

## 2019-06-01 DIAGNOSIS — I2510 Atherosclerotic heart disease of native coronary artery without angina pectoris: Secondary | ICD-10-CM | POA: Diagnosis present

## 2019-06-01 DIAGNOSIS — K219 Gastro-esophageal reflux disease without esophagitis: Secondary | ICD-10-CM | POA: Diagnosis present

## 2019-06-01 DIAGNOSIS — R601 Generalized edema: Secondary | ICD-10-CM | POA: Diagnosis present

## 2019-06-01 DIAGNOSIS — I130 Hypertensive heart and chronic kidney disease with heart failure and stage 1 through stage 4 chronic kidney disease, or unspecified chronic kidney disease: Secondary | ICD-10-CM | POA: Diagnosis present

## 2019-06-01 DIAGNOSIS — I5033 Acute on chronic diastolic (congestive) heart failure: Secondary | ICD-10-CM | POA: Diagnosis present

## 2019-06-01 DIAGNOSIS — N179 Acute kidney failure, unspecified: Secondary | ICD-10-CM | POA: Diagnosis present

## 2019-06-01 DIAGNOSIS — M810 Age-related osteoporosis without current pathological fracture: Secondary | ICD-10-CM | POA: Diagnosis present

## 2019-06-01 DIAGNOSIS — E538 Deficiency of other specified B group vitamins: Secondary | ICD-10-CM | POA: Diagnosis present

## 2019-06-01 DIAGNOSIS — E662 Morbid (severe) obesity with alveolar hypoventilation: Secondary | ICD-10-CM | POA: Diagnosis present

## 2019-06-01 DIAGNOSIS — E872 Acidosis: Secondary | ICD-10-CM | POA: Diagnosis present

## 2019-06-01 DIAGNOSIS — N1832 Chronic kidney disease, stage 3b: Secondary | ICD-10-CM | POA: Diagnosis not present

## 2019-06-01 DIAGNOSIS — G4733 Obstructive sleep apnea (adult) (pediatric): Secondary | ICD-10-CM | POA: Diagnosis not present

## 2019-06-01 DIAGNOSIS — J9622 Acute and chronic respiratory failure with hypercapnia: Secondary | ICD-10-CM

## 2019-06-01 DIAGNOSIS — R0600 Dyspnea, unspecified: Secondary | ICD-10-CM | POA: Diagnosis present

## 2019-06-01 DIAGNOSIS — E1140 Type 2 diabetes mellitus with diabetic neuropathy, unspecified: Secondary | ICD-10-CM | POA: Diagnosis present

## 2019-06-01 DIAGNOSIS — K589 Irritable bowel syndrome without diarrhea: Secondary | ICD-10-CM | POA: Diagnosis present

## 2019-06-01 DIAGNOSIS — Z20828 Contact with and (suspected) exposure to other viral communicable diseases: Secondary | ICD-10-CM | POA: Diagnosis present

## 2019-06-01 DIAGNOSIS — K449 Diaphragmatic hernia without obstruction or gangrene: Secondary | ICD-10-CM | POA: Diagnosis present

## 2019-06-01 LAB — MRSA PCR SCREENING: MRSA by PCR: NEGATIVE

## 2019-06-01 LAB — URINALYSIS, ROUTINE W REFLEX MICROSCOPIC
Bilirubin Urine: NEGATIVE
Glucose, UA: NEGATIVE mg/dL
Hgb urine dipstick: NEGATIVE
Ketones, ur: NEGATIVE mg/dL
Leukocytes,Ua: NEGATIVE
Nitrite: NEGATIVE
Protein, ur: NEGATIVE mg/dL
Specific Gravity, Urine: 1.006 (ref 1.005–1.030)
pH: 6 (ref 5.0–8.0)

## 2019-06-01 LAB — BLOOD GAS, ARTERIAL
Acid-Base Excess: 3.7 mmol/L — ABNORMAL HIGH (ref 0.0–2.0)
Bicarbonate: 26.5 mmol/L (ref 20.0–28.0)
FIO2: 28
O2 Saturation: 97.9 %
Patient temperature: 36.7
pCO2 arterial: 73.3 mmHg (ref 32.0–48.0)
pH, Arterial: 7.243 — ABNORMAL LOW (ref 7.350–7.450)
pO2, Arterial: 117 mmHg — ABNORMAL HIGH (ref 83.0–108.0)

## 2019-06-01 LAB — GLUCOSE, CAPILLARY
Glucose-Capillary: 110 mg/dL — ABNORMAL HIGH (ref 70–99)
Glucose-Capillary: 109 mg/dL — ABNORMAL HIGH (ref 70–99)
Glucose-Capillary: 92 mg/dL (ref 70–99)

## 2019-06-01 LAB — SARS CORONAVIRUS 2 (TAT 6-24 HRS): SARS Coronavirus 2: NEGATIVE

## 2019-06-01 LAB — CBG MONITORING, ED
Glucose-Capillary: 115 mg/dL — ABNORMAL HIGH (ref 70–99)
Glucose-Capillary: 114 mg/dL — ABNORMAL HIGH (ref 70–99)

## 2019-06-01 MED ORDER — HEPARIN SODIUM (PORCINE) 5000 UNIT/ML IJ SOLN
5000.0000 [IU] | Freq: Three times a day (TID) | INTRAMUSCULAR | Status: DC
  Administered 2019-06-01 – 2019-06-08 (×22): 5000 [IU] via SUBCUTANEOUS
  Filled 2019-06-01 (×21): qty 1

## 2019-06-01 MED ORDER — FLUTICASONE-UMECLIDIN-VILANT 100-62.5-25 MCG/INH IN AEPB: 1.0000 | INHALATION_SPRAY | Freq: Every day | RESPIRATORY_TRACT | Status: DC

## 2019-06-01 MED ORDER — ASPIRIN EC 81 MG PO TBEC
81.0000 mg | DELAYED_RELEASE_TABLET | Freq: Every day | ORAL | Status: DC
  Administered 2019-06-01 – 2019-06-08 (×8): 81 mg via ORAL
  Filled 2019-06-01 (×8): qty 1

## 2019-06-01 MED ORDER — SODIUM CHLORIDE 0.9 % IV SOLN
250.0000 mL | INTRAVENOUS | Status: DC | PRN
  Administered 2019-06-06 – 2019-06-07 (×2): 250 mL via INTRAVENOUS

## 2019-06-01 MED ORDER — ATORVASTATIN CALCIUM 40 MG PO TABS
40.0000 mg | ORAL_TABLET | Freq: Every day | ORAL | Status: DC
  Administered 2019-06-01 – 2019-06-07 (×7): 40 mg via ORAL
  Filled 2019-06-01 (×7): qty 1

## 2019-06-01 MED ORDER — SODIUM CHLORIDE 0.9% FLUSH: 3.0000 mL | INTRAVENOUS | Status: DC | PRN

## 2019-06-01 MED ORDER — CHLORHEXIDINE GLUCONATE CLOTH 2 % EX PADS
6.0000 | MEDICATED_PAD | Freq: Every day | CUTANEOUS | Status: DC
  Administered 2019-06-01 – 2019-06-08 (×8): 6 via TOPICAL

## 2019-06-01 MED ORDER — FUROSEMIDE 10 MG/ML IJ SOLN
40.0000 mg | Freq: Two times a day (BID) | INTRAMUSCULAR | Status: DC
  Administered 2019-06-01 – 2019-06-07 (×13): 40 mg via INTRAVENOUS
  Filled 2019-06-01 (×13): qty 4

## 2019-06-01 MED ORDER — FLUTICASONE FUROATE-VILANTEROL 100-25 MCG/INH IN AEPB
1.0000 | INHALATION_SPRAY | Freq: Every day | RESPIRATORY_TRACT | Status: DC
  Administered 2019-06-01 – 2019-06-08 (×8): 1 via RESPIRATORY_TRACT
  Filled 2019-06-01: qty 28

## 2019-06-01 MED ORDER — HYDRALAZINE HCL 25 MG PO TABS
25.0000 mg | ORAL_TABLET | Freq: Two times a day (BID) | ORAL | Status: DC
  Administered 2019-06-01 – 2019-06-08 (×14): 25 mg via ORAL
  Filled 2019-06-01 (×14): qty 1

## 2019-06-01 MED ORDER — ACETAMINOPHEN 325 MG PO TABS: 650.0000 mg | ORAL_TABLET | ORAL | Status: DC | PRN

## 2019-06-01 MED ORDER — AMLODIPINE BESYLATE 5 MG PO TABS
5.0000 mg | ORAL_TABLET | Freq: Every day | ORAL | Status: DC
  Administered 2019-06-02 – 2019-06-03 (×2): 5 mg via ORAL
  Filled 2019-06-01 (×2): qty 1

## 2019-06-01 MED ORDER — ALBUTEROL SULFATE HFA 108 (90 BASE) MCG/ACT IN AERS
2.0000 | INHALATION_SPRAY | Freq: Four times a day (QID) | RESPIRATORY_TRACT | Status: DC | PRN
  Filled 2019-06-01: qty 6.7

## 2019-06-01 MED ORDER — INSULIN GLARGINE 100 UNIT/ML ~~LOC~~ SOLN
8.0000 [IU] | Freq: Every day | SUBCUTANEOUS | Status: DC
  Administered 2019-06-01 – 2019-06-03 (×4): 8 [IU] via SUBCUTANEOUS
  Filled 2019-06-01 (×7): qty 0.08

## 2019-06-01 MED ORDER — ORAL CARE MOUTH RINSE
15.0000 mL | Freq: Two times a day (BID) | OROMUCOSAL | Status: DC
  Administered 2019-06-01 – 2019-06-08 (×12): 15 mL via OROMUCOSAL

## 2019-06-01 MED ORDER — INSULIN ASPART 100 UNIT/ML ~~LOC~~ SOLN
0.0000 [IU] | Freq: Three times a day (TID) | SUBCUTANEOUS | Status: DC
  Administered 2019-06-02 (×2): 1 [IU] via SUBCUTANEOUS
  Administered 2019-06-04 – 2019-06-05 (×4): 2 [IU] via SUBCUTANEOUS
  Administered 2019-06-06: 1 [IU] via SUBCUTANEOUS
  Administered 2019-06-07: 2 [IU] via SUBCUTANEOUS
  Administered 2019-06-07: 1 [IU] via SUBCUTANEOUS
  Administered 2019-06-08: 2 [IU] via SUBCUTANEOUS

## 2019-06-01 MED ORDER — GUAIFENESIN ER 600 MG PO TB12
600.0000 mg | ORAL_TABLET | Freq: Two times a day (BID) | ORAL | Status: DC
  Administered 2019-06-01 – 2019-06-08 (×14): 600 mg via ORAL
  Filled 2019-06-01 (×14): qty 1

## 2019-06-01 MED ORDER — ONDANSETRON HCL 4 MG/2ML IJ SOLN: 4.0000 mg | Freq: Four times a day (QID) | INTRAMUSCULAR | Status: DC | PRN

## 2019-06-01 MED ORDER — ALLOPURINOL 100 MG PO TABS
100.0000 mg | ORAL_TABLET | Freq: Two times a day (BID) | ORAL | Status: DC
  Administered 2019-06-01 – 2019-06-08 (×14): 100 mg via ORAL
  Filled 2019-06-01 (×17): qty 1

## 2019-06-01 MED ORDER — METOPROLOL SUCCINATE ER 50 MG PO TB24
75.0000 mg | ORAL_TABLET | Freq: Every morning | ORAL | Status: DC
  Administered 2019-06-02 – 2019-06-08 (×7): 75 mg via ORAL
  Filled 2019-06-01 (×7): qty 1

## 2019-06-01 MED ORDER — SODIUM CHLORIDE 0.9% FLUSH
3.0000 mL | Freq: Two times a day (BID) | INTRAVENOUS | Status: DC
  Administered 2019-06-01 – 2019-06-08 (×13): 3 mL via INTRAVENOUS

## 2019-06-01 MED ORDER — PREDNISONE 20 MG PO TABS
40.0000 mg | ORAL_TABLET | Freq: Every day | ORAL | Status: DC
  Administered 2019-06-01: 40 mg via ORAL
  Filled 2019-06-01 (×3): qty 2

## 2019-06-01 MED ORDER — CHLORHEXIDINE GLUCONATE 0.12 % MT SOLN
15.0000 mL | Freq: Two times a day (BID) | OROMUCOSAL | Status: DC
  Administered 2019-06-01 – 2019-06-08 (×15): 15 mL via OROMUCOSAL
  Filled 2019-06-01 (×15): qty 15

## 2019-06-01 MED ORDER — PANTOPRAZOLE SODIUM 40 MG PO TBEC
40.0000 mg | DELAYED_RELEASE_TABLET | Freq: Every day | ORAL | Status: DC
  Administered 2019-06-01 – 2019-06-08 (×8): 40 mg via ORAL
  Filled 2019-06-01 (×8): qty 1

## 2019-06-01 MED ORDER — AMITRIPTYLINE HCL 25 MG PO TABS
100.0000 mg | ORAL_TABLET | Freq: Every day | ORAL | Status: DC
  Administered 2019-06-01 – 2019-06-07 (×8): 100 mg via ORAL
  Filled 2019-06-01 (×7): qty 4
  Filled 2019-06-01: qty 1
  Filled 2019-06-01: qty 4
  Filled 2019-06-01: qty 1

## 2019-06-01 MED ORDER — UMECLIDINIUM BROMIDE 62.5 MCG/INH IN AEPB
1.0000 | INHALATION_SPRAY | Freq: Every day | RESPIRATORY_TRACT | Status: DC
  Administered 2019-06-01 – 2019-06-08 (×8): 1 via RESPIRATORY_TRACT
  Filled 2019-06-01: qty 7

## 2019-06-01 NOTE — ED Notes (Signed)
Have paged pharmacy for [facility]

## 2019-06-01 NOTE — H&P (Signed)
History and Physical    DYAN LABARBERA BJY:[id number] DOB: [birth year] DOA: 05/31/2019  PCP: [name], MD   Patient coming from: Home   Chief Complaint: Increased abdominal and leg swelling, DOE   HPI: [name] is a 73 y.o. female with medical history significant for coronary artery disease, hypertension, type 2 diabetes mellitus, COPD with chronic hypoxic respiratory failure, chronic kidney disease stage III, and chronic diastolic CHF, now presenting to the emergency department for evaluation of increased swelling involving her abdomen and bilateral legs, as well as exertional dyspnea.  She denies fevers, cough, or wheezing.  She denies chest pain.  ED Course: Upon arrival to the ED, patient is found to be afebrile, saturating upper 80s on room air, slightly tachypneic, and with stable blood pressure.  EKG features a sinus rhythm and chest x-ray is notable for bibasilar atelectasis.  CT of the abdomen and pelvis demonstrates a large hiatal hernia, gastric wall thickening that could reflect gastritis, no obstructive changes, and stable appearing anasarca.  Chemistry panel with potassium of 5.6 and creatinine 1.64.  CBC unremarkable.  BNP is elevated to 206.  Patient was treated with 80 mg IV Lasix and 5 g Lokelma in the ED.  COVID-19 testing is in process.  Review of Systems:  All other systems reviewed and apart from HPI, are negative.  Past Medical History:  Diagnosis Date  . Blood transfusion without reported diagnosis   . CAD (coronary artery disease)    Nonobstructive at cardiac catheterization 2000  . Cataract   . Cervical cancer ([place]) 1978  . CHF (congestive heart failure) ([place])   . Chronic back pain   . Chronic kidney disease   . COPD (chronic obstructive pulmonary disease) ([place])   . Degenerative disc disease   . Essential hypertension, benign   . GERD (gastroesophageal reflux disease)   . Gout   . Hiatal hernia 07/27/2013  . History of diverticulitis  of colon   . History of hiatal hernia   . Iron deficiency anemia   . Irritable bowel syndrome   . Lumbar radiculopathy   . Mixed hyperlipidemia   . Neuropathy   . Osteoporosis   . Ovarian cancer [facility]) 1978   patient denies. States this was cervical cancer  . Oxygen deficiency   . Sleep apnea   . Type 2 diabetes mellitus ([place])   . Vitamin B deficiency 12/25/2009  . Vitamin B12 deficiency     Past Surgical History:  Procedure Laterality Date  . ABDOMINAL HYSTERECTOMY    . BACK SURGERY    . Benign breast cysts    . CHOLECYSTECTOMY    . COLONOSCOPY  10/01/2006   SLF:Pan colonic diverticulosis and moderate internal hemorrhoids/ Otherwise no polyps, masses, inflammatory changes or AVMs/  . COLONOSCOPY  2011   SLF: pancolonic diverticulosis, large internal hemorrhoids  . COLONOSCOPY N/A 01/26/2016   Procedure: COLONOSCOPY;  Surgeon: [name], MD;  Location: [facility];  Service: Endoscopy;  Laterality: N/A;  830   . ESOPHAGOGASTRODUODENOSCOPY  11/19/2006   SLF: Large hiatal hernia without evidence of Cameron ulcers/. Distal esophageal stricture, which allowed the gastroscope to pass without resistance.  A 16 mm Savary later passed with mild resistance/ Normal stomach.sb bx negative  . ESOPHAGOGASTRODUODENOSCOPY  10/01/2006   YQM:VHQIO hiatal hernia.  Distal esophagus without evidence of   erythema, ulceration or Barrett's esophagus  . ESOPHAGOGASTRODUODENOSCOPY  2011   SLF: large hh, distal esophageal web narrowing to 32mm s/p dilation to  71mm  . ESOPHAGOGASTRODUODENOSCOPY N/A 08/06/2013   SLF: 1. Stricture at the gastroesophageal junction 2. large hiatal hernia. 3. Mild erosive gastritis.  Marland Kitchen GIVENS CAPSULE STUDY N/A 08/06/2013   INCOMPLETE-SMALL BOWLE ULCERS  . KNEE SURGERY Right   . PARTIAL HYSTERECTOMY  1978  . small bowel capsule  2008   negative  . SPINE SURGERY    . TONSILLECTOMY AND ADENOIDECTOMY    . Two back surgeries/fusion    . UMBILICAL HERNIA REPAIR  2010      reports that she quit smoking about 57 years ago. Her smoking use included cigarettes. She started smoking about 58 years ago. She has a 1.00 pack-year smoking history. She has never used smokeless tobacco. She reports that she does not drink alcohol or use drugs.  No Known Allergies  Family History  Problem Relation Age of Onset  . Colon cancer Brother        diagnosed age 77. Living.   Marland Kitchen Ulcers Sister   . Diabetes Sister   . Heart attack Sister   . Kidney failure Sister   . Stroke Sister   . Ulcers Mother   . Diabetes Mother   . Heart attack Mother   . Stroke Mother   . Asthma Mother   . Heart disease Mother   . Cervical cancer Mother   . Heart attack Brother   . Heart disease Brother   . Asthma Sister   . Diabetes Brother   . Stroke Maternal Grandmother   . Heart attack Maternal Grandmother   . Heart attack Other   . Early death Father        MVA in his 74s     Prior to Admission medications   Medication Sig Start Date End Date Taking? Authorizing Provider  albuterol (PROVENTIL) (2.5 MG/3ML) 0.083% nebulizer solution Take 2.5 mg by nebulization every 6 (six) hours as needed for wheezing or shortness of breath.  04/09/19  Yes [provider]  albuterol (VENTOLIN HFA) 108 (90 Base) MCG/ACT inhaler Inhale 2 puffs into the lungs every 6 (six) hours as needed for wheezing or shortness of breath.  04/09/19  Yes [provider]  allopurinol (ZYLOPRIM) 100 MG tablet Take 1 tablet by mouth 2 (two) times daily. 10/09/12  Yes [provider]  amitriptyline (ELAVIL) 100 MG tablet Take 100 mg by mouth at bedtime.     Yes [provider]  amLODipine (NORVASC) 5 MG tablet Take 1 tablet (5 mg total) by mouth daily. 06/18/18 05/31/19 Yes Satira Sark, MD  aspirin EC 81 MG tablet Take 81 mg by mouth daily.   Yes [provider]  atorvastatin (LIPITOR) 40 MG tablet Take 1 tablet (40 mg total) by mouth at bedtime. 05/04/18  Yes Satira Sark,  MD  BIOTIN PO Take 1 capsule by mouth daily.   Yes [provider]  esomeprazole (NEXIUM) 40 MG capsule TAKE 1 CAPSULE TWICE A DAY 30 MINUTES PRIOR TO MEALS Patient taking differently: Take 40 mg by mouth every morning. *May take one additional capsule if needed 01/19/18  Yes Annitta Needs, NP  famotidine (PEPCID) 40 MG tablet TAKE 1 TABLET AT BEDTIME AS NEEDED TO CONTROL REFLUX Patient taking differently: Take 40 mg by mouth at bedtime as needed for heartburn or indigestion.  06/23/15  Yes Mannam, Praveen, MD  furosemide (LASIX) 20 MG tablet Take 2 tablets (40 mg total) by mouth daily. Take 80 mg alternating with 60 mg every other day Patient taking differently:  Take 40 mg by mouth daily.  07/18/18 05/31/19 Yes Barton Dubois, MD  hydrALAZINE (APRESOLINE) 25 MG tablet Take 1 tablet (25 mg total) by mouth 2 (two) times daily. 03/16/19 06/14/19 Yes Daune Perch, NP  insulin lispro (HUMALOG) 100 UNIT/ML KiwkPen Inject 14-16 Units into the skin See admin instructions.  03/12/16  Yes [provider]  LANTUS SOLOSTAR 100 UNIT/ML SOPN Inject 16 Units into the skin at bedtime.  10/06/12  Yes [provider]  loperamide (IMODIUM) 2 MG capsule Take 2 mg by mouth as needed for diarrhea or loose stools.   Yes [provider]  meclizine (ANTIVERT) 25 MG tablet Take 1 tablet (25 mg total) by mouth 3 (three) times daily as needed for dizziness. 07/21/17  Yes [name], MD  metoprolol succinate (TOPROL-XL) 50 MG 24 hr tablet Take 75 mg by mouth daily. Take with or immediately following a meal. Patient taking differently: Take 75 mg by mouth every morning.  10/13/18  Yes Satira Sark, MD  spironolactone (ALDACTONE) 50 MG tablet Take 50 mg by mouth daily.   Yes [provider]  TRELEGY ELLIPTA 100-62.5-25 MCG/INH AEPB Inhale 1 puff into the lungs daily. 05/13/19  Yes [provider]    Physical Exam: Vitals:   05/31/19 1559 05/31/19 1600 05/31/19  2130 05/31/19 2200  BP: (!) 147/67  (!) 147/59 (!) 140/38  Pulse: 74  79 81  Resp: (!) 24  20 13   Temp: 97.8 F (36.6 C)     TempSrc: Oral     SpO2: 93%  (!) 89% (!) 88%  Weight:  112.9 kg    Height:  4\' 8"  (1.422 m)      Constitutional: NAD, calm  Eyes: PERTLA, lids and conjunctivae normal ENMT: Mucous membranes are moist. Posterior pharynx clear of any exudate or lesions.   Neck: normal, supple, no masses, no thyromegaly Respiratory: no wheezing, no crackles. Dyspnea with speech. No accessory muscle use.  Cardiovascular: S1 & S2 heard, regular rate and rhythm. Bilateral leg swelling. Abdomen: soft, no tenderness. Bowel sounds active.  Musculoskeletal: no clubbing / cyanosis. No joint deformity upper and lower extremities.   Skin: no significant rashes, lesions, ulcers. Warm, dry, well-perfused. Neurologic: No facial asymmetry. Sensation intact. Moving all extremities.  Psychiatric:  Alert and oriented to person, place, and situation. Pleasant, cooperative.     Labs on Admission: I have personally reviewed following labs and imaging studies  CBC: Recent Labs  Lab 05/31/19 1658  WBC 8.8  HGB 11.8*  HCT 39.9  MCV 91.1  PLT 102   Basic Metabolic Panel: Recent Labs  Lab 05/31/19 1658  NA 139  K 5.6*  CL 104  CO2 27  GLUCOSE 131*  BUN 27*  CREATININE 1.64*  CALCIUM 8.6*   GFR: Estimated Creatinine Clearance: 31.8 mL/min (A) (by C-G formula based on SCr of 1.64 mg/dL (H)). Liver Function Tests: Recent Labs  Lab 05/31/19 1658  AST 14*  ALT 10  ALKPHOS 71  BILITOT 1.0  PROT 7.6  ALBUMIN 3.8   Recent Labs  Lab 05/31/19 1658  LIPASE 38   No results for input(s): AMMONIA in the last 168 hours. Coagulation Profile: No results for input(s): INR, PROTIME in the last 168 hours. Cardiac Enzymes: No results for input(s): CKTOTAL, CKMB, CKMBINDEX, TROPONINI in the last 168 hours. BNP (last 3 results) No results for input(s): PROBNP in the last 8760 hours.  HbA1C: No results for input(s): HGBA1C in the last 72 hours. CBG:  No results for input(s): GLUCAP in the last 168 hours. Lipid Profile: No results for input(s): CHOL, HDL, LDLCALC, TRIG, CHOLHDL, LDLDIRECT in the last 72 hours. Thyroid Function Tests: No results for input(s): TSH, T4TOTAL, FREET4, T3FREE, THYROIDAB in the last 72 hours. Anemia Panel: No results for input(s): VITAMINB12, FOLATE, FERRITIN, TIBC, IRON, RETICCTPCT in the last 72 hours. Urine analysis:    Component Value Date/Time   COLORURINE YELLOW 07/12/2018 1535   APPEARANCEUR HAZY (A) 07/12/2018 1535   LABSPEC 1.012 07/12/2018 1535   PHURINE 6.0 07/12/2018 1535   GLUCOSEU NEGATIVE 07/12/2018 1535   HGBUR NEGATIVE 07/12/2018 1535   BILIRUBINUR NEGATIVE 07/12/2018 1535   KETONESUR NEGATIVE 07/12/2018 1535   PROTEINUR 100 (A) 07/12/2018 1535   UROBILINOGEN 0.2 01/29/2007 1500   NITRITE NEGATIVE 07/12/2018 1535   LEUKOCYTESUR NEGATIVE 07/12/2018 1535   Sepsis Labs: @LABRCNTIP (procalcitonin:4,lacticidven:4) )No results found for this or any previous visit (from the past 240 hour(s)).   Radiological Exams on Admission: Ct Abdomen Pelvis Wo Contrast  Result Date: 05/31/2019 CLINICAL DATA:  Abdominal distension increasing over the past 3 weeks EXAM: CT ABDOMEN AND PELVIS WITHOUT CONTRAST TECHNIQUE: Multidetector CT imaging of the abdomen and pelvis was performed following the standard protocol without IV contrast. COMPARISON:  05/25/2019 FINDINGS: Lower chest: 5 mm nodule is noted in the medial aspect of the left lower lobe. A few smaller associated nodules are noted. Large hiatal hernia is seen with almost the entire stomach within the chest. Hepatobiliary: No focal liver abnormality is seen. Status post cholecystectomy. No biliary dilatation. Pancreas: Unremarkable. No pancreatic ductal dilatation or surrounding inflammatory changes. Spleen: Normal in size without focal abnormality. Adrenals/Urinary Tract: Adrenal glands  are within normal limits. The kidneys demonstrate no definitive renal calculi or obstructive changes. The bladder is partially decompressed. Stomach/Bowel: Changes of diverticulosis are seen within the colon without evidence of diverticulitis. No obstructive or inflammatory changes are noted. The appendix is within normal limits. No small bowel abnormality is seen. The stomach again demonstrates a large hiatal hernia with wall thickening relatively stable from the prior exam. Vascular/Lymphatic: Aortic atherosclerosis. No enlarged abdominal or pelvic lymph nodes. Reproductive: Status post hysterectomy. No adnexal masses. Other: No abdominal wall hernia or abnormality. No abdominopelvic ascites. Considerable changes of anasarca are again identified within the abdominal wall stable from the prior study. Some laxity of the pelvic floor is noted and stable. Musculoskeletal: Postsurgical and degenerative changes are noted stable from the prior study. IMPRESSION: Large hiatal hernia stable from the prior exam. Some wall thickening in the stomach is noted suspicious for gastritis. No obstructive changes are noted. Chronic changes similar to that seen on the prior exam. Changes of anasarca stable from the previous study. Electronically Signed   By: Inez Catalina M.D.   On: 05/31/2019 20:34   Dg Chest Portable 1 View  Result Date: 05/31/2019 CLINICAL DATA:  Cough EXAM: PORTABLE CHEST 1 VIEW COMPARISON:  Radiographs 07/12/2018, CT chest 07/16/2006, same-day CT abdomen and pelvis FINDINGS: Irregularity along the right heart border likely corresponds to the large hiatal hernia seen on CT of the abdomen pelvis. Hazy areas of basilar atelectasis are present. Additional increased attenuation lung bases likely related to body habitus. No pneumothorax or visible effusion. A right IJ approach Port-A-Cath tip terminates in the mid SVC. There is cardiomegaly which is similar to slightly increased from prior exam though may be  accentuated by the portable technique. Slightly lobular configuration of the heart could also reflect small pericardial effusion. The aorta is  tortuous. No acute osseous or soft tissue abnormality. Degenerative changes are present in the imaged spine and shoulders. IMPRESSION: 1. Irregularity along the right heart border likely corresponds to the large hiatal hernia seen on CT of the abdomen pelvis. 2. Bibasilar atelectasis. No other acute cardiopulmonary abnormality. Increased attenuation of the bases likely related to body habitus. 3. Slightly lobular configuration of the heart could reflect the small volume pericardial fluid seen on CT abdomen pelvis. Electronically Signed   By: Lovena Le M.D.   On: 05/31/2019 20:46    EKG: Independently reviewed. Sinus rhythm, rate 79, QTc 448 ms.   Assessment/Plan   1. Acute on chronic diastolic CHF  - Presents with several days on increasing abdominal distension, b/l leg swelling, and DOE  - She was treated with Lasix 80 mg IV in ED  - Continue diuresis with Lasix 40 mg IV q12h, follow daily wt and I/O's, continue beta-blocker as tolerated    2. CKD stage III; hyperkalemia   - SCr is 1.64 on admission with mild hyperkalemia, similar to prior chem panels  - She was treated with Lasix and Lokelma in ED  - Renally-dose medications, monitor renal function and electrolytes during diuresis   3. COPD; chronic hypoxic respiratory failure  - Patient reports recent increase in SOB without cough or wheeze, has been using home O2 only prn  - Continue Trelegy Ellipta and as-needed albuterol, continue as-needed supplemental O2    4. Insulin-dependent DM  - A1c was 7.4% in July 2020  - Managed at home with Lantus and Humalog  - Continue Lantus and use Novlog correctional while in hospital   5. CAD  - No anginal complaints, continue ASA, statin, and beta-blocker    PPE: Mask, face shield  DVT prophylaxis: sq heparin  Code Status: Full  Family Communication:  Discussed with patient  Consults called: None  Admission status: Observation     Vianne Bulls, MD [facility] Pager [phone]  If 7PM-7AM, please contact night-coverage [email or web address] Password [facility]  06/01/2019, 12:34 AM

## 2019-06-01 NOTE — ED Notes (Signed)
Have notified charge nurse about waiting to send pt upstairs due to concern for CO2 retention.

## 2019-06-01 NOTE — Progress Notes (Signed)
Patient Demographics:    [name], is a 73 y.o. female, DOB - 1946-03-07, KYH:[id number]  Admit date - 05/31/2019   Admitting Physician Minyon Billiter [name], MD  Outpatient Primary MD for the patient is [name], MD  LOS - 0   Chief Complaint  Patient presents with  . Abdominal Pain        Subjective:    [name] today has no fevers, no emesis,  No chest pain, patient was lethargic, so ABG was ordered ABG showed hypercapnia with uncompensated respiratory acidosis  Assessment  & Plan :    Principal Problem:   Acute on chronic respiratory failure with hypoxia and hypercapnia with respiratory acidosis Active Problems:   Acute on chronic diastolic CHF (congestive heart failure) (HCC)   CKD (chronic kidney disease) stage 3, GFR 30-59 ml/min   Obstructive chronic bronchitis without exacerbation (HCC)   OSA on CPAP   Type 2 diabetes with nephropathy (HCC)   Chronic respiratory failure with hypoxia (HCC)   Dyspnea and respiratory abnormalities  Brief summary 73 y.o. female with medical history significant for coronary artery disease, hypertension, type 2 diabetes mellitus, COPD with chronic hypoxic respiratory failure, chronic kidney disease stage III, and chronic diastolic CHF admitted on 15/17/6160 with worsening volume overload and found to have uncompensated respiratory acidosis/hypercapnic respiratory failure  A/p 1) acute on chronic hypoxic and hypercapnic respiratory failure----ABG was done due to increased lethargy, ABG showed pH of 7.24 with PCO2 of 73.3 PO2 was 117 O2 sats 98% this was done on 2 L of oxygen/FiO2 of 20% -Patient is BiPAP to manage her hypercapnic respiratory failure with uncompensated respiratory acidosis--however her Covid test was pending -Initiate BiPAP when Covid test results if negative -Avoid high flow oxygen -Continue bronchodilators and diuretics   2)HFpEF--patient with acute on chronic diastolic dysfunction CHF leading to #1 above -Continue IV Lasix 40 mg twice daily, -Monitor daily weights and fluid input and output closely  3) CKD stage IV-   creatinine is 1.64 with patient's baseline being in the past around 2, suspect some volume overload with hemodilution -Pressure was elevated at 5.6, patient received Lokelma , renally adjust medications, avoid nephrotoxic agents/dehydration/hypotension  4) COPD with chronic hypoxic respiratory failure--contributing to #1 above -Continue bronchodilators, avoid high flow O2 due to hypercapnia -Add prednisone 40 mg daily  5)H/o CAD -stable, no chest pains, continue aspirin, Lipitor and Toprol-XL 75 mg daily  6)DM2-recent A1c 7.4,--reflecting fair control, continue Lantus 8 units qhs along with Use Novolog/Humalog Sliding scale insulin with Accu-Cheks/Fingersticks as ordered -Anticipate worsening glycemic control with steroids  7)gastritis and large hiatal hernia--stable, continue Protonix 40 mg daily  8)HTN-stable, continue Toprol-XL 75 mg daily, hydralazine 25 mg twice daily, amlodipine 5 mg daily  Disposition/Need for in-Hospital Stay- patient unable to be discharged at this time due to --- hypoxic and hypercapnic respiratory failure requiring BiPAP and IV diuresis  Code Status : Full  Family Communication:    (patient is alert, awake and coherent)  Disposition Plan  : TBD  Consults  :  Na  DVT Prophylaxis  :   - Heparin - SCDs *  Lab Results  Component Value Date   PLT 298 05/31/2019    Inpatient Medications  Scheduled Meds: . allopurinol  100 mg Oral BID  . amitriptyline  100 mg Oral QHS  . amLODipine  5 mg Oral Daily  . aspirin EC  81 mg Oral Daily  . atorvastatin  40 mg Oral q1800  . chlorhexidine  15 mL Mouth Rinse BID  . Chlorhexidine Gluconate Cloth  6 each Topical Daily  . fluticasone furoate-vilanterol  1 puff Inhalation Daily   And  . umeclidinium bromide  1  puff Inhalation Daily  . furosemide  40 mg Intravenous Q12H  . heparin  5,000 Units Subcutaneous Q8H  . hydrALAZINE  25 mg Oral BID  . insulin aspart  0-9 Units Subcutaneous TID WC  . insulin glargine  8 Units Subcutaneous QHS  . mouth rinse  15 mL Mouth Rinse q12n4p  . metoprolol succinate  75 mg Oral q morning - 10a  . pantoprazole  40 mg Oral Daily  . sodium chloride flush  3 mL Intravenous Q12H   Continuous Infusions: . sodium chloride     PRN Meds:.sodium chloride, acetaminophen, albuterol, ondansetron (ZOFRAN) IV, sodium chloride flush    Anti-infectives (From admission, onward)   None        Objective:   Vitals:   06/01/19 1505 06/01/19 1600 06/01/19 1633 06/01/19 1700  BP: (!) 139/43 (!) 106/56  (!) 134/43  Pulse: 88 70 72 81  Resp: 16 (!) 0 (!) 0 16  Temp:   98.1 F (36.7 C)   TempSrc:   Oral   SpO2: 96% 95% 96% 94%  Weight:      Height:        Wt Readings from Last 3 Encounters:  06/01/19 110.9 kg  05/31/19 112.9 kg  03/16/19 89.8 kg    No intake or output data in the 24 hours ending 06/01/19 1840   Physical Exam  Gen:-Increased lethargy,, conversational dyspnea HEENT:- [place].AT, No sclera icterus Nose- [place] 3 L/min Neck-Supple Neck, +ve JVD,.  Lungs-diminished in bases with few scattered wheezes CV- S1, S2 normal, regular  Abd-  +ve B.Sounds, increased truncal adiposity, No tenderness,    Extremity/Skin:- 2+  edema, pedal pulses present  Psych-affect is appropriate, oriented x3 Neuro-generalized weakness no new focal deficits, no tremors   Data Review:   Micro Results Recent Results (from the past 240 hour(s))  SARS CORONAVIRUS 2 (TAT 6-24 HRS) Nasopharyngeal Nasopharyngeal Swab     Status: None   Collection Time: 05/31/19 10:27 PM   Specimen: Nasopharyngeal Swab  Result Value Ref Range Status   SARS Coronavirus 2 NEGATIVE NEGATIVE Final    Comment: (NOTE) SARS-CoV-2 target nucleic acids are NOT DETECTED. The SARS-CoV-2 RNA is generally  detectable in upper and lower respiratory specimens during the acute phase of infection. Negative results do not preclude SARS-CoV-2 infection, do not rule out co-infections with other pathogens, and should not be used as the sole basis for treatment or other patient management decisions. Negative results must be combined with clinical observations, patient history, and epidemiological information. The expected result is Negative. Fact Sheet for Patients: SugarRoll.be Fact Sheet for Healthcare Providers: https://www.woods-mathews.com/ This test is not yet approved or cleared by the [place] FDA and  has been authorized for detection and/or diagnosis of SARS-CoV-2 by FDA under an Emergency Use Authorization (EUA). This EUA will remain  in effect (meaning this test can be used) for the duration of the COVID-19 declaration under Section 56 4(b)(1) of the Act, 21 U.S.C. section 360bbb-3(b)(1), unless the authorization is terminated or revoked sooner. Performed at [facility],  [address] [place], [place] [address]     Radiology Reports Ct Abdomen Pelvis Wo Contrast  Result Date: 05/31/2019 CLINICAL DATA:  Abdominal distension increasing over the past 3 weeks EXAM: CT ABDOMEN AND PELVIS WITHOUT CONTRAST TECHNIQUE: Multidetector CT imaging of the abdomen and pelvis was performed following the standard protocol without IV contrast. COMPARISON:  05/25/2019 FINDINGS: Lower chest: 5 mm nodule is noted in the medial aspect of the left lower lobe. A few smaller associated nodules are noted. Large hiatal hernia is seen with almost the entire stomach within the chest. Hepatobiliary: No focal liver abnormality is seen. Status post cholecystectomy. No biliary dilatation. Pancreas: Unremarkable. No pancreatic ductal dilatation or surrounding inflammatory changes. Spleen: Normal in size without focal abnormality. Adrenals/Urinary Tract: Adrenal  glands are within normal limits. The kidneys demonstrate no definitive renal calculi or obstructive changes. The bladder is partially decompressed. Stomach/Bowel: Changes of diverticulosis are seen within the colon without evidence of diverticulitis. No obstructive or inflammatory changes are noted. The appendix is within normal limits. No small bowel abnormality is seen. The stomach again demonstrates a large hiatal hernia with wall thickening relatively stable from the prior exam. Vascular/Lymphatic: Aortic atherosclerosis. No enlarged abdominal or pelvic lymph nodes. Reproductive: Status post hysterectomy. No adnexal masses. Other: No abdominal wall hernia or abnormality. No abdominopelvic ascites. Considerable changes of anasarca are again identified within the abdominal wall stable from the prior study. Some laxity of the pelvic floor is noted and stable. Musculoskeletal: Postsurgical and degenerative changes are noted stable from the prior study. IMPRESSION: Large hiatal hernia stable from the prior exam. Some wall thickening in the stomach is noted suspicious for gastritis. No obstructive changes are noted. Chronic changes similar to that seen on the prior exam. Changes of anasarca stable from the previous study. Electronically Signed   By: Inez Catalina M.D.   On: 05/31/2019 20:34   Dg Chest Portable 1 View  Result Date: 05/31/2019 CLINICAL DATA:  Cough EXAM: PORTABLE CHEST 1 VIEW COMPARISON:  Radiographs 07/12/2018, CT chest 07/16/2006, same-day CT abdomen and pelvis FINDINGS: Irregularity along the right heart border likely corresponds to the large hiatal hernia seen on CT of the abdomen pelvis. Hazy areas of basilar atelectasis are present. Additional increased attenuation lung bases likely related to body habitus. No pneumothorax or visible effusion. A right IJ approach Port-A-Cath tip terminates in the mid SVC. There is cardiomegaly which is similar to slightly increased from prior exam though may  be accentuated by the portable technique. Slightly lobular configuration of the heart could also reflect small pericardial effusion. The aorta is tortuous. No acute osseous or soft tissue abnormality. Degenerative changes are present in the imaged spine and shoulders. IMPRESSION: 1. Irregularity along the right heart border likely corresponds to the large hiatal hernia seen on CT of the abdomen pelvis. 2. Bibasilar atelectasis. No other acute cardiopulmonary abnormality. Increased attenuation of the bases likely related to body habitus. 3. Slightly lobular configuration of the heart could reflect the small volume pericardial fluid seen on CT abdomen pelvis. Electronically Signed   By: Lovena Le M.D.   On: 05/31/2019 20:46     CBC Recent Labs  Lab 05/31/19 1658  WBC 8.8  HGB 11.8*  HCT 39.9  PLT 298  MCV 91.1  MCH 26.9  MCHC 29.6*  RDW 14.4    Chemistries  Recent Labs  Lab 05/31/19 1658  NA 139  K 5.6*  CL 104  CO2 27  GLUCOSE 131*  BUN 27*  CREATININE 1.64*  CALCIUM 8.6*  AST 14*  ALT 10  ALKPHOS 71  BILITOT 1.0   ------------------------------------------------------------------------------------------------------------------ No results for input(s): CHOL, HDL, LDLCALC, TRIG, CHOLHDL, LDLDIRECT in the last 72 hours.  Lab Results  Component Value Date   HGBA1C 6.7 (H) 07/13/2018   ------------------------------------------------------------------------------------------------------------------ No results for input(s): TSH, T4TOTAL, T3FREE, THYROIDAB in the last 72 hours.  Invalid input(s): FREET3 ------------------------------------------------------------------------------------------------------------------ No results for input(s): VITAMINB12, FOLATE, FERRITIN, TIBC, IRON, RETICCTPCT in the last 72 hours.  Coagulation profile No results for input(s): INR, PROTIME in the last 168 hours.  No results for input(s): DDIMER in the last 72 hours.  Cardiac Enzymes  No results for input(s): CKMB, TROPONINI, MYOGLOBIN in the last 168 hours.  Invalid input(s): CK ------------------------------------------------------------------------------------------------------------------    Component Value Date/Time   BNP 206.0 (H) 05/31/2019 1658     Roxan Hockey M.D on 06/01/2019 at 6:40 PM  Go to [email or web address] - for contact info  [facility] - Office  [phone]

## 2019-06-01 NOTE — ED Notes (Signed)
Per Dr. Joesph Fillers, take patient down to 0.5 L [place]. Have to wait until pt's COVID test is back before Bipap. Will change orders to step down

## 2019-06-01 NOTE — Progress Notes (Deleted)
Cardiology Office Note    Date:  06/01/2019   ID:  Graycee, Greeson 16-Nov-1945, MRN [id number]  PCP:  [name], MD  Cardiologist: [name], MD    No chief complaint on file.   History of Present Illness:    [name] is a 73 y.o. female ***    Past Medical History:  Diagnosis Date  . Blood transfusion without reported diagnosis   . CAD (coronary artery disease)    Nonobstructive at cardiac catheterization 2000  . Cataract   . Cervical cancer ([place]) 1978  . CHF (congestive heart failure) ([place])   . Chronic back pain   . Chronic kidney disease   . COPD (chronic obstructive pulmonary disease) ([place])   . Degenerative disc disease   . Essential hypertension, benign   . GERD (gastroesophageal reflux disease)   . Gout   . Hiatal hernia 07/27/2013  . History of diverticulitis of colon   . History of hiatal hernia   . Iron deficiency anemia   . Irritable bowel syndrome   . Lumbar radiculopathy   . Mixed hyperlipidemia   . Neuropathy   . Osteoporosis   . Ovarian cancer [facility]) 1978   patient denies. States this was cervical cancer  . Oxygen deficiency   . Sleep apnea   . Type 2 diabetes mellitus ([place])   . Vitamin B deficiency 12/25/2009  . Vitamin B12 deficiency     Past Surgical History:  Procedure Laterality Date  . ABDOMINAL HYSTERECTOMY    . BACK SURGERY    . Benign breast cysts    . CHOLECYSTECTOMY    . COLONOSCOPY  10/01/2006   SLF:Pan colonic diverticulosis and moderate internal hemorrhoids/ Otherwise no polyps, masses, inflammatory changes or AVMs/  . COLONOSCOPY  2011   SLF: pancolonic diverticulosis, large internal hemorrhoids  . COLONOSCOPY N/A 01/26/2016   Procedure: COLONOSCOPY;  Surgeon: [name], MD;  Location: [facility];  Service: Endoscopy;  Laterality: N/A;  830   . ESOPHAGOGASTRODUODENOSCOPY  11/19/2006   SLF: Large hiatal hernia without evidence of [place] ulcers/. Distal esophageal stricture, which  allowed the gastroscope to pass without resistance.  A 16 mm Savary later passed with mild resistance/ Normal stomach.sb bx negative  . ESOPHAGOGASTRODUODENOSCOPY  10/01/2006   CWC:BJSEG hiatal hernia.  Distal esophagus without evidence of   erythema, ulceration or Barrett's esophagus  . ESOPHAGOGASTRODUODENOSCOPY  2011   SLF: large hh, distal esophageal web narrowing to 83mm s/p dilation to 2mm  . ESOPHAGOGASTRODUODENOSCOPY N/A 08/06/2013   SLF: 1. Stricture at the gastroesophageal junction 2. large hiatal hernia. 3. Mild erosive gastritis.  Marland Kitchen GIVENS CAPSULE STUDY N/A 08/06/2013   INCOMPLETE-SMALL BOWLE ULCERS  . KNEE SURGERY Right   . PARTIAL HYSTERECTOMY  1978  . small bowel capsule  2008   negative  . SPINE SURGERY    . TONSILLECTOMY AND ADENOIDECTOMY    . Two back surgeries/fusion    . UMBILICAL HERNIA REPAIR  2010    Current Medications: Facility-Administered Medications Prior to Visit  Medication Dose Route Frequency Provider Last Rate Last Dose  . 0.9 %  sodium chloride infusion  250 mL Intravenous PRN Opyd, Ilene Qua, MD      . acetaminophen (TYLENOL) tablet 650 mg  650 mg Oral Q4H PRN Opyd, Timothy S, MD      . albuterol (VENTOLIN HFA) 108 (90 Base) MCG/ACT inhaler 2 puff  2 puff Inhalation Q6H PRN Opyd, Ilene Qua, MD      .  allopurinol (ZYLOPRIM) tablet 100 mg  100 mg Oral BID Opyd, Ilene Qua, MD      . amitriptyline (ELAVIL) tablet 100 mg  100 mg Oral QHS Opyd, Ilene Qua, MD   100 mg at 06/01/19 0428  . amLODipine (NORVASC) tablet 5 mg  5 mg Oral Daily Opyd, Ilene Qua, MD      . aspirin EC tablet 81 mg  81 mg Oral Daily Opyd, Ilene Qua, MD      . atorvastatin (LIPITOR) tablet 40 mg  40 mg Oral q1800 Opyd, Ilene Qua, MD      . fluticasone furoate-vilanterol (BREO ELLIPTA) 100-25 MCG/INH 1 puff  1 puff Inhalation Daily Opyd, Ilene Qua, MD       And  . umeclidinium bromide (INCRUSE ELLIPTA) 62.5 MCG/INH 1 puff  1 puff Inhalation Daily Opyd, Ilene Qua, MD      . furosemide  (LASIX) injection 40 mg  40 mg Intravenous Q12H Opyd, Ilene Qua, MD      . heparin injection 5,000 Units  5,000 Units Subcutaneous Q8H Opyd, Ilene Qua, MD   5,000 Units at 06/01/19 0427  . hydrALAZINE (APRESOLINE) tablet 25 mg  25 mg Oral BID Opyd, Ilene Qua, MD      . insulin aspart (novoLOG) injection 0-9 Units  0-9 Units Subcutaneous TID WC Opyd, Ilene Qua, MD      . insulin glargine (LANTUS) injection 8 Units  8 Units Subcutaneous QHS Opyd, Ilene Qua, MD   8 Units at 06/01/19 0433  . metoprolol succinate (TOPROL-XL) 24 hr tablet 75 mg  75 mg Oral q morning - 10a Opyd, Ilene Qua, MD      . ondansetron (ZOFRAN) injection 4 mg  4 mg Intravenous Q6H PRN Opyd, Ilene Qua, MD      . pantoprazole (PROTONIX) EC tablet 40 mg  40 mg Oral Daily Opyd, Ilene Qua, MD      . sodium chloride flush (NS) 0.9 % injection 3 mL  3 mL Intravenous Q12H Opyd, Timothy S, MD      . sodium chloride flush (NS) 0.9 % injection 3 mL  3 mL Intravenous PRN Opyd, Ilene Qua, MD       Outpatient Medications Prior to Visit  Medication Sig Dispense Refill  . albuterol (PROVENTIL) (2.5 MG/3ML) 0.083% nebulizer solution Take 2.5 mg by nebulization every 6 (six) hours as needed for wheezing or shortness of breath.     Marland Kitchen albuterol (VENTOLIN HFA) 108 (90 Base) MCG/ACT inhaler Inhale 2 puffs into the lungs every 6 (six) hours as needed for wheezing or shortness of breath.     . allopurinol (ZYLOPRIM) 100 MG tablet Take 1 tablet by mouth 2 (two) times daily.    Marland Kitchen amitriptyline (ELAVIL) 100 MG tablet Take 100 mg by mouth at bedtime.      Marland Kitchen amLODipine (NORVASC) 5 MG tablet Take 1 tablet (5 mg total) by mouth daily. 90 tablet 3  . aspirin EC 81 MG tablet Take 81 mg by mouth daily.    Marland Kitchen atorvastatin (LIPITOR) 40 MG tablet Take 1 tablet (40 mg total) by mouth at bedtime. 90 tablet 1  . BIOTIN PO Take 1 capsule by mouth daily.    Marland Kitchen esomeprazole (NEXIUM) 40 MG capsule TAKE 1 CAPSULE TWICE A DAY 30 MINUTES PRIOR TO MEALS (Patient taking  differently: Take 40 mg by mouth every morning. *May take one additional capsule if needed) 180 capsule 1  . famotidine (PEPCID) 40 MG tablet TAKE 1 TABLET AT BEDTIME  AS NEEDED TO CONTROL REFLUX (Patient taking differently: Take 40 mg by mouth at bedtime as needed for heartburn or indigestion. ) 90 tablet 1  . furosemide (LASIX) 20 MG tablet Take 2 tablets (40 mg total) by mouth daily. Take 80 mg alternating with 60 mg every other day (Patient taking differently: Take 40 mg by mouth daily. ) 30 tablet 2  . hydrALAZINE (APRESOLINE) 25 MG tablet Take 1 tablet (25 mg total) by mouth 2 (two) times daily. 60 tablet 6  . insulin lispro (HUMALOG) 100 UNIT/ML KiwkPen Inject 14-16 Units into the skin See admin instructions.     Marland Kitchen LANTUS SOLOSTAR 100 UNIT/ML SOPN Inject 16 Units into the skin at bedtime.     Marland Kitchen loperamide (IMODIUM) 2 MG capsule Take 2 mg by mouth as needed for diarrhea or loose stools.    . meclizine (ANTIVERT) 25 MG tablet Take 1 tablet (25 mg total) by mouth 3 (three) times daily as needed for dizziness. 30 tablet 0  . metoprolol succinate (TOPROL-XL) 50 MG 24 hr tablet Take 75 mg by mouth daily. Take with or immediately following a meal. (Patient taking differently: Take 75 mg by mouth every morning. ) 135 tablet 1  . spironolactone (ALDACTONE) 50 MG tablet Take 50 mg by mouth daily.    . TRELEGY ELLIPTA 100-62.5-25 MCG/INH AEPB Inhale 1 puff into the lungs daily.       Allergies:   Patient has no known allergies.   Social History   Socioeconomic History  . Marital status: Widowed    Spouse name: Galyean  . Number of children: 4  . Years of education: Not on file  . Highest education level: 10th grade  Occupational History  . Occupation: retired    Comment: Ambulance person, [employer] work  Scientific laboratory technician  . Financial resource strain: Not on file  . Food insecurity    Worry: Not on file    Inability: Not on file  . Transportation needs    Medical: Not on file    Non-medical: Not on file   Tobacco Use  . Smoking status: Former Smoker    Packs/day: 1.00    Years: 1.00    Pack years: 1.00    Types: Cigarettes    Start date: 02/19/1961    Quit date: 08/05/1961    Years since quitting: 57.8  . Smokeless tobacco: Never Used  . Tobacco comment: Quit smoking x 50 years  Substance and Sexual Activity  . Alcohol use: No  . Drug use: No  . Sexual activity: Not Currently  Lifestyle  . Physical activity    Days per week: Not on file    Minutes per session: Not on file  . Stress: Not on file  Relationships  . Social Herbalist on phone: Not on file    Gets together: Not on file    Attends religious service: Not on file    Active member of club or organization: Not on file    Attends meetings of clubs or organizations: Not on file    Relationship status: Not on file  Other Topics Concern  . Not on file  Social History Narrative   HAS 4 SON-GRAND [place].   Lives in home with Mcg[place]in - married 13 Y   Cook, sew, quilt, chochet     Family History:  The patient's ***family history includes Asthma in her mother and sister; Cervical cancer in her mother; Colon cancer in her  brother; Diabetes in her brother, mother, and sister; Early death in her father; Heart attack in her brother, maternal grandmother, mother, sister, and another family member; Heart disease in her brother and mother; Kidney failure in her sister; Stroke in her maternal grandmother, mother, and sister; Ulcers in her mother and sister.   Review of Systems:   Please see the history of present illness.     General:  No chills, fever, night sweats or weight changes.  Cardiovascular:  No chest pain, dyspnea on exertion, edema, orthopnea, palpitations, paroxysmal nocturnal dyspnea. Dermatological: No rash, lesions/masses Respiratory: No cough, dyspnea Urologic: No hematuria, dysuria Abdominal:   No nausea, vomiting, diarrhea, bright red blood per rectum, melena, or hematemesis  Neurologic:  No visual changes, wkns, changes in mental status. All other systems reviewed and are otherwise negative except as noted above.   Physical Exam:    VS:  There were no vitals taken for this visit.   General: Well developed, well nourished,female appearing in no acute distress. Head: Normocephalic, atraumatic, sclera non-icteric, no xanthomas, nares are without discharge.  Neck: No carotid bruits. JVD not elevated.  Lungs: Respirations regular and unlabored, without wheezes or rales.  Heart: ***Regular rate and rhythm. No S3 or S4.  No murmur, no rubs, or gallops appreciated. Abdomen: Soft, non-tender, non-distended with normoactive bowel sounds. No hepatomegaly. No rebound/guarding. No obvious abdominal masses. Msk:  Strength and tone appear normal for age. No joint deformities or effusions. Extremities: No clubbing or cyanosis. No edema.  Distal pedal pulses are 2+ bilaterally. Neuro: Alert and oriented X 3. Moves all extremities spontaneously. No focal deficits noted. Psych:  Responds to questions appropriately with a normal affect. Skin: No rashes or lesions noted  Wt Readings from Last 3 Encounters:  05/31/19 248 lb 12.8 oz (112.9 kg)  05/31/19 248 lb 12.8 oz (112.9 kg)  03/16/19 198 lb (89.8 kg)        Studies/Labs Reviewed:   EKG:  EKG is*** ordered today.  The ekg ordered today demonstrates ***  Recent Labs: 05/31/2019: ALT 10; B Natriuretic Peptide 206.0; BUN 27; Creatinine, Ser 1.64; Hemoglobin 11.8; Platelets 298; Potassium 5.6; Sodium 139   Lipid Panel    Component Value Date/Time   CHOL 257 (H) 10/10/2017 1050   TRIG 177 (H) 10/10/2017 1050   HDL 38 (L) 10/10/2017 1050   CHOLHDL 6.8 (H) 10/10/2017 1050   LDLCALC 186 (H) 10/10/2017 1050    Additional studies/ records that were reviewed today include:  ***  Assessment:    No diagnosis found.   Plan:   In order of problems listed above:  1. ***    Medication Adjustments/Labs and Tests  Ordered: Current medicines are reviewed at length with the patient today.  Concerns regarding medicines are outlined above.  Medication changes, Labs and Tests ordered today are listed in the Patient Instructions below. There are no Patient Instructions on file for this visit.   Signed, Erma Heritage, PA-C  06/01/2019 6:29 AM    [facility] [address] [place], [place] [address] Phone: [phone] Fax: [phone]

## 2019-06-01 NOTE — ED Notes (Addendum)
CRITICAL VALUE ALERT  Critical Value: pCO2 73.3  Date & Time Notied:  06/01/2019 0928  Provider Notified: Dr. Joesph Fillers   Orders Received/Actions taken: None yet

## 2019-06-02 LAB — BLOOD GAS, ARTERIAL
Acid-Base Excess: 2.5 mmol/L — ABNORMAL HIGH (ref 0.0–2.0)
Bicarbonate: 25.5 mmol/L (ref 20.0–28.0)
FIO2: 26
O2 Saturation: 93.1 %
Patient temperature: 37
pCO2 arterial: 63.8 mmHg — ABNORMAL HIGH (ref 32.0–48.0)
pH, Arterial: 7.276 — ABNORMAL LOW (ref 7.350–7.450)
pO2, Arterial: 70.9 mmHg — ABNORMAL LOW (ref 83.0–108.0)

## 2019-06-02 LAB — CBC
HCT: 36.7 % (ref 36.0–46.0)
Hemoglobin: 10.6 g/dL — ABNORMAL LOW (ref 12.0–15.0)
MCH: 26.8 pg (ref 26.0–34.0)
MCHC: 28.9 g/dL — ABNORMAL LOW (ref 30.0–36.0)
MCV: 92.7 fL (ref 80.0–100.0)
Platelets: 234 10*3/uL (ref 150–400)
RBC: 3.96 MIL/uL (ref 3.87–5.11)
RDW: 14.1 % (ref 11.5–15.5)
WBC: 6.9 10*3/uL (ref 4.0–10.5)
nRBC: 0 % (ref 0.0–0.2)

## 2019-06-02 LAB — BASIC METABOLIC PANEL
Anion gap: 8 (ref 5–15)
BUN: 31 mg/dL — ABNORMAL HIGH (ref 8–23)
CO2: 29 mmol/L (ref 22–32)
Calcium: 8.5 mg/dL — ABNORMAL LOW (ref 8.9–10.3)
Chloride: 105 mmol/L (ref 98–111)
Creatinine, Ser: 1.8 mg/dL — ABNORMAL HIGH (ref 0.44–1.00)
GFR calc Af Amer: 32 mL/min — ABNORMAL LOW (ref >60)
GFR calc non Af Amer: 27 mL/min — ABNORMAL LOW (ref >60)
Glucose, Bld: 134 mg/dL — ABNORMAL HIGH (ref 70–99)
Potassium: 6.3 mmol/L (ref 3.5–5.1)
Sodium: 142 mmol/L (ref 135–145)

## 2019-06-02 LAB — GLUCOSE, CAPILLARY
Glucose-Capillary: 142 mg/dL — ABNORMAL HIGH (ref 70–99)
Glucose-Capillary: 124 mg/dL — ABNORMAL HIGH (ref 70–99)
Glucose-Capillary: 114 mg/dL — ABNORMAL HIGH (ref 70–99)
Glucose-Capillary: 105 mg/dL — ABNORMAL HIGH (ref 70–99)

## 2019-06-02 LAB — POTASSIUM
Potassium: 5.8 mmol/L — ABNORMAL HIGH (ref 3.5–5.1)
Potassium: 5.6 mmol/L — ABNORMAL HIGH (ref 3.5–5.1)
Potassium: 5.5 mmol/L — ABNORMAL HIGH (ref 3.5–5.1)
Potassium: 5.4 mmol/L — ABNORMAL HIGH (ref 3.5–5.1)

## 2019-06-02 MED ORDER — SODIUM ZIRCONIUM CYCLOSILICATE 10 G PO PACK
10.0000 g | PACK | Freq: Once | ORAL | Status: AC
  Administered 2019-06-02: 10 g via ORAL
  Filled 2019-06-02: qty 1

## 2019-06-02 NOTE — Progress Notes (Signed)
CRITICAL VALUE ALERT  Critical Value:  K+ 6.3  Date & Time Notied:  06/02/19 3468  Provider Notified: Dr. Myna Hidalgo   Orders Received/Actions taken: See chart

## 2019-06-02 NOTE — TOC Initial Note (Signed)
Transition of Care [facility]) - Initial/Assessment Note    Patient Details  Name: [name] MRN: [id number] Date of Birth: [birth year]  Transition of [facility]) CM/SW Contact:    Ihor Gully, LCSW Phone Number: 06/02/2019, 4:37 PM  Clinical Narrative:                 Patient from home alone. Admitted for acute on chronic hypoxic hypercapnic respiratory failure. Patient ambulates with a rolling walker. She is typically independent with ADLs, however when she needs help she her daughter in law and granddaughter assist her. Patient drives and has no issues obtaining medications. She is on home oxygen at 2L and uses a CPAP at night.  She states that she has a neb machine that does not function properly as she has had it for 10 years.  Attending notified that patient will need neb machine order at discharge.     Expected Discharge Plan: [place] Barriers to Discharge: Continued Medical Work up   Patient Goals and CMS Choice Patient states their goals for this hospitalization and ongoing recovery are:: return home to baseline CMS Medicare.gov Compare Post Acute Care list provided to:: Patient Choice offered to / list presented to : Patient  Expected Discharge Plan and Services Expected Discharge Plan: [place]       Living arrangements for the past 2 months: Single Family Home                                      Prior Living Arrangements/Services Living arrangements for the past 2 months: Single Family Home Lives with:: Self Patient language and need for interpreter reviewed:: Yes Do you feel safe going back to the place where you live?: Yes      Need for Family Participation in Patient Care: Yes (Comment) Care giver support system in place?: Yes (comment) Current home services: DME Criminal Activity/Legal Involvement Pertinent to Current Situation/Hospitalization: No - Comment as needed  Activities of Daily Living Home  Assistive Devices/Equipment: Environmental consultant (specify type), Shower chair with back ADL Screening (condition at time of admission) Patient's cognitive ability adequate to safely complete daily activities?: Yes Is the patient deaf or have difficulty hearing?: No Does the patient have difficulty seeing, even when wearing glasses/contacts?: No Does the patient have difficulty concentrating, remembering, or making decisions?: No Patient able to express need for assistance with ADLs?: Yes Does the patient have difficulty dressing or bathing?: No Independently performs ADLs?: Yes (appropriate for developmental age) Does the patient have difficulty walking or climbing stairs?: Yes Weakness of Legs: Both Weakness of Arms/Hands: None  Permission Sought/Granted                  Emotional Assessment     Affect (typically observed): Appropriate Orientation: : Oriented to Self, Oriented to Place, Oriented to  Time, Oriented to Situation Alcohol / Substance Use: Not Applicable Psych Involvement: No (comment)  Admission diagnosis:  Anasarca [R60.1] Generalized abdominal pain [R10.84] Dyspnea and respiratory abnormalities [R06.00, R06.89] Patient Active Problem List   Diagnosis Date Noted  . Dyspnea and respiratory abnormalities 06/01/2019  . Acute on chronic respiratory failure with hypoxia and hypercapnia with respiratory acidosis 06/01/2019  . Anasarca 05/31/2019  . Nausea without vomiting 05/31/2019  . Generalized abdominal pain 05/31/2019  . History of colonic polyps 05/31/2019  . Lymphedema of both lower extremities 04/28/2019  .  Chronic respiratory failure with hypoxia ([place]) 07/13/2018  . Acute renal failure superimposed on stage 3 chronic kidney disease ([place]) 07/13/2018  . Chronic diastolic CHF (congestive heart failure) ([place]) 07/13/2018  . COPD (chronic obstructive pulmonary disease) ([place]) 07/13/2018  . COPD exacerbation ([place]) 07/12/2018  . Flatulence 09/18/2017  . IBS (irritable bowel  syndrome) 04/03/2017  . Gout 03/15/2017  . Acute on chronic diastolic CHF (congestive heart failure) ([place]) 05/08/2016  . CKD (chronic kidney disease) stage 3, GFR 30-59 ml/min 05/08/2016  . Constipation 04/25/2016  . Peripheral vertigo 06/27/2015  . Port-A-Cath in place 10/06/2014  . Chronic obstructive pulmonary disease ([place]) 04/30/2014  . Morbid (severe) obesity due to excess calories ([place]) 04/30/2014  . Hemorrhoids, internal, with bleeding 11/10/2013  . Anemia 08/18/2013  . Acid reflux   . Obstructive chronic bronchitis without exacerbation ([place]) 12/26/2009  . Dysphagia 12/26/2009  . DM type 2 with diabetic peripheral neuropathy ([place]) 12/25/2009  . Vitamin B deficiency 12/25/2009  . Iron deficiency anemia due to chronic blood loss 12/25/2009  . Benign essential HTN 12/25/2009  . DEGENERATIVE DISC DISEASE 12/25/2009  . OSA on CPAP 12/25/2009  . HLD (hyperlipidemia) 12/25/2009  . Type 2 diabetes with nephropathy ([place]) 12/25/2009   PCP:  [name], MD Pharmacy:   [facility] - [address], [place] [address] Phone: [phone] Fax: [phone]  [facility] [phone] - [address], [place] - [place] [address] [place] [address] Phone: [phone] Fax: [phone]     Social Determinants of Health (SDOH) Interventions    Readmission Risk Interventions No flowsheet data found.

## 2019-06-02 NOTE — Progress Notes (Signed)
Patient Demographics:    [name], is a 73 y.o. female, DOB - October 10, 1945, VQX:[id number]  Admit date - 05/31/2019   Admitting Physician Rita Engdahl [name], MD  Outpatient Primary MD for the patient is Rita Mealy, MD  LOS - 1   Chief Complaint  Patient presents with   Abdominal Pain        Subjective:    [name] today has no fevers, no emesis,  No chest pain,   -Used BiPAP overnight, -Has been off BiPAP now for at least 3 hours and is becoming lethargic again -Repeat ABG with pH of 7.27 and PCO2 of 64 -Patient will need BiPAP as needed during the day and nightly given persistent uncompensated respiratory acidosis with lethargy  Assessment  & Plan :    Principal Problem:   Acute on chronic respiratory failure with hypoxia and hypercapnia with respiratory acidosis Active Problems:   Acute on chronic diastolic CHF (congestive heart failure) (HCC)   CKD (chronic kidney disease) stage 3, GFR 30-59 ml/min   Obstructive chronic bronchitis without exacerbation (HCC)   OSA on CPAP   Type 2 diabetes with nephropathy (HCC)   Chronic respiratory failure with hypoxia (HCC)   Dyspnea and respiratory abnormalities  Brief Summary 73 y.o. female with medical history significant for coronary artery disease, hypertension, type 2 diabetes mellitus, COPD with chronic hypoxic respiratory failure, chronic kidney disease stage III, and chronic diastolic CHF admitted on 00/34/9179 with worsening volume overload and found to have uncompensated respiratory acidosis/hypercapnic respiratory failure  A/p 1)Acute on chronic hypoxic and hypercapnic respiratory failure--- -Used BiPAP overnight, -Has been off BiPAP now for at least 3 hours and is becoming lethargic again -Repeat ABG with pH of 7.27 and PCO2 of 64 -Patient will need BiPAP as needed during the day and nightly given persistent  uncompensated respiratory acidosis with lethargy -Avoid high flow oxygen -Continue bronchodilators and diuretics  2)HFpEF--patient with acute on chronic diastolic dysfunction CHF leading to #1 above -Continue IV Lasix 40 mg twice daily, -Fluid balance negative over 1200 overnight,  wt trending down --Monitor daily weights and fluid input and output closely  3) CKD stage IV-with hyperkalemia --  -Potassium up to 6.3, received another dose of Lokelma -Creatinine trending back up, was 1.64 on admission up to 1.8 with diuresis baseline usually around 2 , renally adjust medications, avoid nephrotoxic agents / dehydration / hypotension  4) COPD with chronic hypoxic respiratory failure--contributing to #1 above -Continue bronchodilators, avoid high flow O2 due to hypercapnia -c/n  prednisone 40 mg daily  5)H/o CAD -stable, no chest pains, continue aspirin, Lipitor and Toprol-XL 75 mg daily  6)DM2-recent A1c 7.4,--reflecting fair control,  continue Lantus at Reduced dose of  8 units qhs (was on 16 units PTA),  - Use Novolog/Humalog Sliding scale insulin with Accu-Cheks / Fingersticks as ordered -Anticipate worsening glycemic control with steroids  7)Gastritis and Large Hiatal Hernia--stable,  -continue Protonix 40 mg daily  8)HTN-stable, continue Toprol-XL 75 mg daily, hydralazine 25 mg twice daily, amlodipine 5 mg daily  9)FEN--n.p.o. until respiratory status improves enough to be off BiPAP  10)Social/Ethics--- Discussed with Son-  Mr. [name]  [phone]--- -Pt is a Full code w/o limitations to treatment at this time  Disposition/Need for  in-Hospital Stay- patient unable to be discharged at this time due to --- hypoxic and hypercapnic respiratory failure requiring BiPAP and IV diuresis  Code Status : Full  Family Communication:    -Discussed with Son  Mr. [name]  [phone]---  Disposition Plan  : TBD  Consults  :  Na  DVT Prophylaxis  :   - Heparin - SCDs  *  Lab Results  Component Value Date   PLT 234 06/02/2019    Inpatient Medications  Scheduled Meds:  allopurinol  100 mg Oral BID   amitriptyline  100 mg Oral QHS   amLODipine  5 mg Oral Daily   aspirin EC  81 mg Oral Daily   atorvastatin  40 mg Oral q1800   chlorhexidine  15 mL Mouth Rinse BID   Chlorhexidine Gluconate Cloth  6 each Topical Daily   fluticasone furoate-vilanterol  1 puff Inhalation Daily   And   umeclidinium bromide  1 puff Inhalation Daily   furosemide  40 mg Intravenous Q12H   guaiFENesin  600 mg Oral BID   heparin  5,000 Units Subcutaneous Q8H   hydrALAZINE  25 mg Oral BID   insulin aspart  0-9 Units Subcutaneous TID WC   insulin glargine  8 Units Subcutaneous QHS   mouth rinse  15 mL Mouth Rinse q12n4p   metoprolol succinate  75 mg Oral q morning - 10a   pantoprazole  40 mg Oral Daily   predniSONE  40 mg Oral Q breakfast   sodium chloride flush  3 mL Intravenous Q12H   Continuous Infusions:  sodium chloride     PRN Meds:.sodium chloride, acetaminophen, albuterol, ondansetron (ZOFRAN) IV, sodium chloride flush    Anti-infectives (From admission, onward)   None        Objective:   Vitals:   06/02/19 0748 06/02/19 0800 06/02/19 0815 06/02/19 0900  BP:  (!) 111/43  (!) 120/47  Pulse: 100 (!) 101 100 100  Resp: (!) 0 15 19 10   Temp: 98.2 F (36.8 C)     TempSrc: Oral     SpO2: 91% 91% 91% 90%  Weight:      Height:        Wt Readings from Last 3 Encounters:  06/02/19 110.6 kg  05/31/19 112.9 kg  03/16/19 89.8 kg     Intake/Output Summary (Last 24 hours) at 06/02/2019 0944 Last data filed at 06/02/2019 0159 Gross per 24 hour  Intake --  Output 1200 ml  Net -1200 ml     Physical Exam  Gen:-Increased lethargy,, conversational dyspnea HEENT:- [place].AT, No sclera icterus Nose- [place] 2 L/min Neck-Supple Neck, +ve JVD,.  Lungs-diminished in bases with few scattered wheezes CV- S1, S2 normal, regular  Abd-  +ve  B.Sounds, increased truncal adiposity, No tenderness,    Extremity/Skin:- 2+  edema, pedal pulses present  Psych-Sleepy, lethargic  neuro-generalized weakness no new focal deficits, no tremors   Data Review:   Micro Results Recent Results (from the past 240 hour(s))  SARS CORONAVIRUS 2 (TAT 6-24 HRS) Nasopharyngeal Nasopharyngeal Swab     Status: None   Collection Time: 05/31/19 10:27 PM   Specimen: Nasopharyngeal Swab  Result Value Ref Range Status   SARS Coronavirus 2 NEGATIVE NEGATIVE Final    Comment: (NOTE) SARS-CoV-2 target nucleic acids are NOT DETECTED. The SARS-CoV-2 RNA is generally detectable in upper and lower respiratory specimens during the acute phase of infection. Negative results do not preclude SARS-CoV-2 infection, do not rule out co-infections with  other pathogens, and should not be used as the sole basis for treatment or other patient management decisions. Negative results must be combined with clinical observations, patient history, and epidemiological information. The expected result is Negative. Fact Sheet for Patients: SugarRoll.be Fact Sheet for Healthcare Providers: https://www.woods-mathews.com/ This test is not yet approved or cleared by the [place] FDA and  has been authorized for detection and/or diagnosis of SARS-CoV-2 by FDA under an Emergency Use Authorization (EUA). This EUA will remain  in effect (meaning this test can be used) for the duration of the COVID-19 declaration under Section 56 4(b)(1) of the Act, 21 U.S.C. section 360bbb-3(b)(1), unless the authorization is terminated or revoked sooner. Performed at [facility], [place] [address], [place], [place] [address]   MRSA PCR Screening     Status: None   Collection Time: 06/01/19 11:51 AM   Specimen: Nasopharyngeal  Result Value Ref Range Status   MRSA by PCR NEGATIVE NEGATIVE Final    Comment:        The GeneXpert MRSA Assay  (FDA approved for NASAL specimens only), is one component of a comprehensive MRSA colonization surveillance program. It is not intended to diagnose MRSA infection nor to guide or monitor treatment for MRSA infections. Performed at [facility], [address] [place], [place] [address]     Radiology Reports Ct Abdomen Pelvis Wo Contrast  Result Date: 05/31/2019 CLINICAL DATA:  Abdominal distension increasing over the past 3 weeks EXAM: CT ABDOMEN AND PELVIS WITHOUT CONTRAST TECHNIQUE: Multidetector CT imaging of the abdomen and pelvis was performed following the standard protocol without IV contrast. COMPARISON:  05/25/2019 FINDINGS: Lower chest: 5 mm nodule is noted in the medial aspect of the left lower lobe. A few smaller associated nodules are noted. Large hiatal hernia is seen with almost the entire stomach within the chest. Hepatobiliary: No focal liver abnormality is seen. Status post cholecystectomy. No biliary dilatation. Pancreas: Unremarkable. No pancreatic ductal dilatation or surrounding inflammatory changes. Spleen: Normal in size without focal abnormality. Adrenals/Urinary Tract: Adrenal glands are within normal limits. The kidneys demonstrate no definitive renal calculi or obstructive changes. The bladder is partially decompressed. Stomach/Bowel: Changes of diverticulosis are seen within the colon without evidence of diverticulitis. No obstructive or inflammatory changes are noted. The appendix is within normal limits. No small bowel abnormality is seen. The stomach again demonstrates a large hiatal hernia with wall thickening relatively stable from the prior exam. Vascular/Lymphatic: Aortic atherosclerosis. No enlarged abdominal or pelvic lymph nodes. Reproductive: Status post hysterectomy. No adnexal masses. Other: No abdominal wall hernia or abnormality. No abdominopelvic ascites. Considerable changes of anasarca are again identified within the abdominal wall stable from the prior  study. Some laxity of the pelvic floor is noted and stable. Musculoskeletal: Postsurgical and degenerative changes are noted stable from the prior study. IMPRESSION: Large hiatal hernia stable from the prior exam. Some wall thickening in the stomach is noted suspicious for gastritis. No obstructive changes are noted. Chronic changes similar to that seen on the prior exam. Changes of anasarca stable from the previous study. Electronically Signed   By: Inez Catalina M.D.   On: 05/31/2019 20:34   Dg Chest Portable 1 View  Result Date: 05/31/2019 CLINICAL DATA:  Cough EXAM: PORTABLE CHEST 1 VIEW COMPARISON:  Radiographs 07/12/2018, CT chest 07/16/2006, same-day CT abdomen and pelvis FINDINGS: Irregularity along the right heart border likely corresponds to the large hiatal hernia seen on CT of the abdomen pelvis. Hazy areas of basilar  atelectasis are present. Additional increased attenuation lung bases likely related to body habitus. No pneumothorax or visible effusion. A right IJ approach Port-A-Cath tip terminates in the mid SVC. There is cardiomegaly which is similar to slightly increased from prior exam though may be accentuated by the portable technique. Slightly lobular configuration of the heart could also reflect small pericardial effusion. The aorta is tortuous. No acute osseous or soft tissue abnormality. Degenerative changes are present in the imaged spine and shoulders. IMPRESSION: 1. Irregularity along the right heart border likely corresponds to the large hiatal hernia seen on CT of the abdomen pelvis. 2. Bibasilar atelectasis. No other acute cardiopulmonary abnormality. Increased attenuation of the bases likely related to body habitus. 3. Slightly lobular configuration of the heart could reflect the small volume pericardial fluid seen on CT abdomen pelvis. Electronically Signed   By: Lovena Le M.D.   On: 05/31/2019 20:46     CBC Recent Labs  Lab 05/31/19 1658 06/02/19 0415  WBC 8.8 6.9  HGB  11.8* 10.6*  HCT 39.9 36.7  PLT 298 234  MCV 91.1 92.7  MCH 26.9 26.8  MCHC 29.6* 28.9*  RDW 14.4 14.1    Chemistries  Recent Labs  Lab 05/31/19 1658 06/02/19 0415  NA 139 142  K 5.6* 6.3*  CL 104 105  CO2 27 29  GLUCOSE 131* 134*  BUN 27* 31*  CREATININE 1.64* 1.80*  CALCIUM 8.6* 8.5*  AST 14*  --   ALT 10  --   ALKPHOS 71  --   BILITOT 1.0  --    ------------------------------------------------------------------------------------------------------------------ No results for input(s): CHOL, HDL, LDLCALC, TRIG, CHOLHDL, LDLDIRECT in the last 72 hours.  Lab Results  Component Value Date   HGBA1C 6.7 (H) 07/13/2018   ------------------------------------------------------------------------------------------------------------------ No results for input(s): TSH, T4TOTAL, T3FREE, THYROIDAB in the last 72 hours.  Invalid input(s): FREET3 ------------------------------------------------------------------------------------------------------------------ No results for input(s): VITAMINB12, FOLATE, FERRITIN, TIBC, IRON, RETICCTPCT in the last 72 hours.  Coagulation profile No results for input(s): INR, PROTIME in the last 168 hours.  No results for input(s): DDIMER in the last 72 hours.  Cardiac Enzymes No results for input(s): CKMB, TROPONINI, MYOGLOBIN in the last 168 hours.  Invalid input(s): CK ------------------------------------------------------------------------------------------------------------------    Component Value Date/Time   BNP 206.0 (H) 05/31/2019 1658     Roxan Hockey M.D on 06/02/2019 at 9:44 AM  Go to [email or web address] - for contact info  [facility] - Office  [phone]

## 2019-06-03 LAB — BASIC METABOLIC PANEL
Anion gap: 10 (ref 5–15)
BUN: 42 mg/dL — ABNORMAL HIGH (ref 8–23)
CO2: 29 mmol/L (ref 22–32)
Calcium: 8.4 mg/dL — ABNORMAL LOW (ref 8.9–10.3)
Chloride: 103 mmol/L (ref 98–111)
Creatinine, Ser: 2.33 mg/dL — ABNORMAL HIGH (ref 0.44–1.00)
GFR calc Af Amer: 23 mL/min — ABNORMAL LOW (ref >60)
GFR calc non Af Amer: 20 mL/min — ABNORMAL LOW (ref >60)
Glucose, Bld: 92 mg/dL (ref 70–99)
Potassium: 5.2 mmol/L — ABNORMAL HIGH (ref 3.5–5.1)
Sodium: 142 mmol/L (ref 135–145)

## 2019-06-03 LAB — BLOOD GAS, ARTERIAL
Acid-Base Excess: 5.3 mmol/L — ABNORMAL HIGH (ref 0.0–2.0)
Bicarbonate: 28.1 mmol/L — ABNORMAL HIGH (ref 20.0–28.0)
Delivery systems: POSITIVE
Drawn by: 317771
Expiratory PAP: 5
FIO2: 36
Inspiratory PAP: 10
O2 Saturation: 98.8 %
Patient temperature: 36.8
pCO2 arterial: 69.1 mmHg (ref 32.0–48.0)
pH, Arterial: 7.283 — ABNORMAL LOW (ref 7.350–7.450)
pO2, Arterial: 129 mmHg — ABNORMAL HIGH (ref 83.0–108.0)

## 2019-06-03 LAB — GLUCOSE, CAPILLARY
Glucose-Capillary: 86 mg/dL (ref 70–99)
Glucose-Capillary: 88 mg/dL (ref 70–99)
Glucose-Capillary: 102 mg/dL — ABNORMAL HIGH (ref 70–99)
Glucose-Capillary: 142 mg/dL — ABNORMAL HIGH (ref 70–99)

## 2019-06-03 LAB — POTASSIUM
Potassium: 5.5 mmol/L — ABNORMAL HIGH (ref 3.5–5.1)
Potassium: 5.2 mmol/L — ABNORMAL HIGH (ref 3.5–5.1)
Potassium: 5.1 mmol/L (ref 3.5–5.1)
Potassium: 5.1 mmol/L (ref 3.5–5.1)
Potassium: 5.3 mmol/L — ABNORMAL HIGH (ref 3.5–5.1)

## 2019-06-03 MED ORDER — DOXYCYCLINE HYCLATE 100 MG PO TABS
100.0000 mg | ORAL_TABLET | Freq: Two times a day (BID) | ORAL | Status: DC
  Administered 2019-06-03 – 2019-06-08 (×11): 100 mg via ORAL
  Filled 2019-06-03 (×11): qty 1

## 2019-06-03 MED ORDER — SODIUM POLYSTYRENE SULFONATE 15 GM/60ML PO SUSP
30.0000 g | Freq: Once | ORAL | Status: AC
  Administered 2019-06-03: 30 g via ORAL
  Filled 2019-06-03: qty 120

## 2019-06-03 MED ORDER — SODIUM CHLORIDE 0.9 % IV SOLN
1.0000 g | INTRAVENOUS | Status: DC
  Administered 2019-06-03 – 2019-06-06 (×4): 1 g via INTRAVENOUS
  Filled 2019-06-03 (×4): qty 10

## 2019-06-03 MED ORDER — METHYLPREDNISOLONE SODIUM SUCC 40 MG IJ SOLR
40.0000 mg | Freq: Two times a day (BID) | INTRAMUSCULAR | Status: AC
  Administered 2019-06-03 – 2019-06-04 (×4): 40 mg via INTRAVENOUS
  Filled 2019-06-03 (×4): qty 1

## 2019-06-03 MED ORDER — ALBUTEROL SULFATE (2.5 MG/3ML) 0.083% IN NEBU
2.5000 mg | INHALATION_SOLUTION | Freq: Four times a day (QID) | RESPIRATORY_TRACT | Status: DC
  Administered 2019-06-03 – 2019-06-07 (×15): 2.5 mg via RESPIRATORY_TRACT
  Filled 2019-06-03 (×15): qty 3

## 2019-06-03 MED ORDER — SODIUM POLYSTYRENE SULFONATE 15 GM/60ML PO SUSP
45.0000 g | Freq: Once | ORAL | Status: AC
  Administered 2019-06-03: 45 g via ORAL
  Filled 2019-06-03: qty 180

## 2019-06-03 NOTE — Progress Notes (Signed)
CRITICAL VALUE ALERT  Critical Value:  PC02 69.1  Date & Time Notied:  06/03/19 0440  Provider Notified: Dr. Maudie Mercury   Orders Received/Actions taken: Keep pt on bipap

## 2019-06-03 NOTE — Consult Note (Addendum)
Consult requested by: [facility] hospitalist, Dr. [name] Consult requested for: Respiratory failure  HPI: This is a 73 year old who has history of coronary disease, hypertension, diabetes, COPD with chronic hypoxic respiratory failure chronic kidney disease, sleep apnea chronic diastolic heart failure and who came to the emergency department because of increased swelling and increasing shortness of breath.  She was hypoxic in the emergency department.  CT of the abdomen and pelvis showed a large hiatal hernia evidence of gastritis and anasarca.  She has been using CPAP here in the hospital but apparently not at home.  She had cough with production of a large quantity of thick creamy colored sputum when I first went in to see her.  She says she is still short of breath.  She is not having any chest pain.  Her swelling is about the same.  Past Medical History:  Diagnosis Date  . Blood transfusion without reported diagnosis   . CAD (coronary artery disease)    Nonobstructive at cardiac catheterization 2000  . Cataract   . Cervical cancer ([place]) 1978  . CHF (congestive heart failure) ([place])   . Chronic back pain   . Chronic kidney disease   . COPD (chronic obstructive pulmonary disease) ([place])   . Degenerative disc disease   . Essential hypertension, benign   . GERD (gastroesophageal reflux disease)   . Gout   . Hiatal hernia 07/27/2013  . History of diverticulitis of colon   . History of hiatal hernia   . Iron deficiency anemia   . Irritable bowel syndrome   . Lumbar radiculopathy   . Mixed hyperlipidemia   . Neuropathy   . Osteoporosis   . Ovarian cancer [facility]) 1978   patient denies. States this was cervical cancer  . Oxygen deficiency   . Sleep apnea   . Type 2 diabetes mellitus ([place])   . Vitamin B deficiency 12/25/2009  . Vitamin B12 deficiency      Family History  Problem Relation Age of Onset  . Colon cancer Brother        diagnosed age 82. Living.   Marland Kitchen Ulcers Sister   .  Diabetes Sister   . Heart attack Sister   . Kidney failure Sister   . Stroke Sister   . Ulcers Mother   . Diabetes Mother   . Heart attack Mother   . Stroke Mother   . Asthma Mother   . Heart disease Mother   . Cervical cancer Mother   . Heart attack Brother   . Heart disease Brother   . Asthma Sister   . Diabetes Brother   . Stroke Maternal Grandmother   . Heart attack Maternal Grandmother   . Heart attack Other   . Early death Father        MVA in his 71s     Social History   Socioeconomic History  . Marital status: Widowed    Spouse name: Hansmann  . Number of children: 4  . Years of education: Not on file  . Highest education level: 10th grade  Occupational History  . 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 Financial resource strain: Not on file  . Food insecurity    Worry: Not on file    Inability: Not on file  . Transportation needs    Medical: Not on file    Non-medical: Not on file  Tobacco Use  . Smoking status: Former Smoker    Packs/day: 1.00  Years: 1.00    Pack years: 1.00    Types: Cigarettes    Start date: 02/19/1961    Quit date: 08/05/1961    Years since quitting: 57.8  . Smokeless tobacco: Never Used  . Tobacco comment: Quit smoking x 50 years  Substance and Sexual Activity  . Alcohol use: No  . Drug use: No  . Sexual activity: Not Currently  Lifestyle  . Physical activity    Days per week: Not on file    Minutes per session: Not on file  . Stress: Not on file  Relationships  . Social Herbalist on phone: Not on file    Gets together: Not on file    Attends religious service: Not on file    Active member of club or organization: Not on file    Attends meetings of clubs or organizations: Not on file    Relationship status: Not on file  Other Topics Concern  . Not on file  Social History Narrative   HAS 4 SON-GRAND Ages.   Lives in home with Huebert - married 13 Y    Cook, sew, quilt, chochet     ROS: Except as mentioned 10 point review of systems is negative    Objective: Vital signs in last 24 hours: Temp:  [97.8 F (36.6 C)-98.2 F (36.8 C)] 98.1 F (36.7 C) (10/29 0736) Pulse Rate:  [73-100] 73 (10/29 0800) Resp:  [8-24] 20 (10/29 0800) BP: (82-135)/(39-99) 115/39 (10/29 0800) SpO2:  [86 %-100 %] 97 % (10/29 0800) Weight:  [110.2 kg] 110.2 kg (10/29 0500) Weight change: -0.7 kg Last BM Date: (unkown )  Intake/Output from previous day: 10/28 0701 - 10/29 0700 In: -  Out: 125 [Urine:125]  PHYSICAL EXAM Constitutional: She is awake and coughing.  She is obese.  Eyes: Pupils react ears nose mouth and throat: Mucous membranes are moist.  Cardiovascular: Heart is regular with normal heart sounds.  Respiratory: She has rhonchi bilaterally more on the right than on the left.  Gastrointestinal: Her abdomen is soft obese with no masses.  Musculoskeletal: Grossly normal strength.  Neurological: No focal abnormalities.  Psychiatric: Normal mood and affect.  Lab Results: Basic Metabolic Panel: Recent Labs    06/02/19 0415  06/03/19 0151 06/03/19 0705  NA 142  --   --  142  K 6.3*   < > 5.5* 5.2*  5.2*  CL 105  --   --  103  CO2 29  --   --  29  GLUCOSE 134*  --   --  92  BUN 31*  --   --  42*  CREATININE 1.80*  --   --  2.33*  CALCIUM 8.5*  --   --  8.4*   < > = values in this interval not displayed.   Liver Function Tests: Recent Labs    05/31/19 1658  AST 14*  ALT 10  ALKPHOS 71  BILITOT 1.0  PROT 7.6  ALBUMIN 3.8   Recent Labs    05/31/19 1658  LIPASE 38   No results for input(s): AMMONIA in the last 72 hours. CBC: Recent Labs    05/31/19 1658 06/02/19 0415  WBC 8.8 6.9  HGB 11.8* 10.6*  HCT 39.9 36.7  MCV 91.1 92.7  PLT 298 234   Cardiac Enzymes: No results for input(s): CKTOTAL, CKMB, CKMBINDEX, TROPONINI in the last 72 hours. BNP: No results for input(s): PROBNP in the last 72 hours.  D-Dimer: No results  for input(s): DDIMER in the last 72 hours. CBG: Recent Labs    06/01/19 2123 06/02/19 0747 06/02/19 1142 06/02/19 1657 06/02/19 2144 06/03/19 0740  GLUCAP 92 142* 124* 114* 105* 86   Hemoglobin A1C: No results for input(s): HGBA1C in the last 72 hours. Fasting Lipid Panel: No results for input(s): CHOL, HDL, LDLCALC, TRIG, CHOLHDL, LDLDIRECT in the last 72 hours. Thyroid Function Tests: No results for input(s): TSH, T4TOTAL, FREET4, T3FREE, THYROIDAB in the last 72 hours. Anemia Panel: No results for input(s): VITAMINB12, FOLATE, FERRITIN, TIBC, IRON, RETICCTPCT in the last 72 hours. Coagulation: No results for input(s): LABPROT, INR in the last 72 hours. Urine Drug Screen: Drugs of Abuse  No results found for: LABOPIA, COCAINSCRNUR, LABBENZ, AMPHETMU, THCU, LABBARB  Alcohol Level: No results for input(s): ETH in the last 72 hours. Urinalysis: Recent Labs    05/31/19 [place] 1.006  PHURINE 6.0  GLUCOSEU NEGATIVE  HGBUR NEGATIVE  BILIRUBINUR NEGATIVE  KETONESUR NEGATIVE  PROTEINUR NEGATIVE  NITRITE NEGATIVE  LEUKOCYTESUR NEGATIVE   Misc. Labs:   ABGS: Recent Labs    06/03/19 0414  PHART 7.283*  PO2ART 129*  HCO3 28.1*     MICROBIOLOGY: Recent Results (from the past 240 hour(s))  SARS CORONAVIRUS 2 (TAT 6-24 HRS) Nasopharyngeal Nasopharyngeal Swab     Status: None   Collection Time: 05/31/19 10:27 PM   Specimen: Nasopharyngeal Swab  Result Value Ref Range Status   SARS Coronavirus 2 NEGATIVE NEGATIVE Final    Comment: (NOTE) SARS-CoV-2 target nucleic acids are NOT DETECTED. The SARS-CoV-2 RNA is generally detectable in upper and lower respiratory specimens during the acute phase of infection. Negative results do not preclude SARS-CoV-2 infection, do not rule out co-infections with other pathogens, and should not be used as the sole basis for treatment or other patient management decisions. Negative results must be combined  with clinical observations, patient history, and epidemiological information. The expected result is Negative. Fact Sheet for Patients: SugarRoll.be Fact Sheet for Healthcare Providers: https://www.woods-mathews.com/ This test is not yet approved or cleared by the [place] FDA and  has been authorized for detection and/or diagnosis of SARS-CoV-2 by FDA under an Emergency Use Authorization (EUA). This EUA will remain  in effect (meaning this test can be used) for the duration of the COVID-19 declaration under Section 56 4(b)(1) of the Act, 21 U.S.C. section 360bbb-3(b)(1), unless the authorization is terminated or revoked sooner. Performed at [facility], [place] [address] [place], [place] [address]   MRSA PCR Screening     Status: None   Collection Time: 06/01/19 11:51 AM   Specimen: Nasopharyngeal  Result Value Ref Range Status   MRSA by PCR NEGATIVE NEGATIVE Final    Comment:        The GeneXpert MRSA Assay (FDA approved for NASAL specimens only), is one component of a comprehensive MRSA colonization surveillance program. It is not intended to diagnose MRSA infection nor to guide or monitor treatment for MRSA infections. Performed at [facility], [address], [place], [place] [address]     Studies/Results: No results found.  Medications:  Prior to Admission:  Medications Prior to Admission  Medication Sig Dispense Refill Last Dose  . albuterol (PROVENTIL) (2.5 MG/3ML) 0.083% nebulizer solution Take 2.5 mg by nebulization every 6 (six) hours as needed for wheezing or shortness of breath.    Past Month at Unknown time  . albuterol (VENTOLIN HFA) 108 (90 Base) MCG/ACT  inhaler Inhale 2 puffs into the lungs every 6 (six) hours as needed for wheezing or shortness of breath.    05/30/2019 at Unknown time  . allopurinol (ZYLOPRIM) 100 MG tablet Take 1 tablet by mouth 2 (two) times daily.   05/31/2019 at Unknown time  .  amitriptyline (ELAVIL) 100 MG tablet Take 100 mg by mouth at bedtime.     05/30/2019 at Unknown time  . amLODipine (NORVASC) 5 MG tablet Take 1 tablet (5 mg total) by mouth daily. 90 tablet 3 05/31/2019 at Unknown time  . aspirin EC 81 MG tablet Take 81 mg by mouth daily.   05/31/2019 at Unknown time  . atorvastatin (LIPITOR) 40 MG tablet Take 1 tablet (40 mg total) by mouth at bedtime. 90 tablet 1 05/30/2019 at Unknown time  . BIOTIN PO Take 1 capsule by mouth daily.   05/31/2019 at Unknown time  . esomeprazole (NEXIUM) 40 MG capsule TAKE 1 CAPSULE TWICE A DAY 30 MINUTES PRIOR TO MEALS (Patient taking differently: Take 40 mg by mouth every morning. *May take one additional capsule if needed) 180 capsule 1 05/31/2019 at Unknown time  . famotidine (PEPCID) 40 MG tablet TAKE 1 TABLET AT BEDTIME AS NEEDED TO CONTROL REFLUX (Patient taking differently: Take 40 mg by mouth at bedtime as needed for heartburn or indigestion. ) 90 tablet 1 Past Week at Unknown time  . furosemide (LASIX) 20 MG tablet Take 2 tablets (40 mg total) by mouth daily. Take 80 mg alternating with 60 mg every other day (Patient taking differently: Take 40 mg by mouth daily. ) 30 tablet 2 05/31/2019 at Unknown time  . hydrALAZINE (APRESOLINE) 25 MG tablet Take 1 tablet (25 mg total) by mouth 2 (two) times daily. 60 tablet 6 05/31/2019 at Unknown time  . insulin lispro (HUMALOG) 100 UNIT/ML KiwkPen Inject 14-16 Units into the skin See admin instructions.    05/31/2019 at Unknown time  . LANTUS SOLOSTAR 100 UNIT/ML SOPN Inject 16 Units into the skin at bedtime.    05/30/2019 at Unknown time  . loperamide (IMODIUM) 2 MG capsule Take 2 mg by mouth as needed for diarrhea or loose stools.   unknown  . meclizine (ANTIVERT) 25 MG tablet Take 1 tablet (25 mg total) by mouth 3 (three) times daily as needed for dizziness. 30 tablet 0 Past Month at Unknown time  . metoprolol succinate (TOPROL-XL) 50 MG 24 hr tablet Take 75 mg by mouth daily. Take  with or immediately following a meal. (Patient taking differently: Take 75 mg by mouth every morning. ) 135 tablet 1 05/31/2019 at 900  . spironolactone (ALDACTONE) 50 MG tablet Take 50 mg by mouth daily.   05/31/2019 at Unknown time  . TRELEGY ELLIPTA 100-62.5-25 MCG/INH AEPB Inhale 1 puff into the lungs daily.   05/30/2019 at Unknown time   Scheduled: . allopurinol  100 mg Oral BID  . amitriptyline  100 mg Oral QHS  . amLODipine  5 mg Oral Daily  . aspirin EC  81 mg Oral Daily  . atorvastatin  40 mg Oral q1800  . chlorhexidine  15 mL Mouth Rinse BID  . Chlorhexidine Gluconate Cloth  6 each Topical Daily  . fluticasone furoate-vilanterol  1 puff Inhalation Daily   And  . umeclidinium bromide  1 puff Inhalation Daily  . furosemide  40 mg Intravenous Q12H  . guaiFENesin  600 mg Oral BID  . heparin  5,000 Units Subcutaneous Q8H  . hydrALAZINE  25 mg Oral BID  .  insulin aspart  0-9 Units Subcutaneous TID WC  . insulin glargine  8 Units Subcutaneous QHS  . mouth rinse  15 mL Mouth Rinse q12n4p  . metoprolol succinate  75 mg Oral q morning - 10a  . pantoprazole  40 mg Oral Daily  . predniSONE  40 mg Oral Q breakfast  . sodium chloride flush  3 mL Intravenous Q12H  . sodium polystyrene  30 g Oral Once   Continuous: . sodium chloride     CVE:LFYBOF chloride, acetaminophen, albuterol, ondansetron (ZOFRAN) IV, sodium chloride flush  Assesment: She has acute on chronic hypoxic and hypercapnic respiratory failure.  Blood gas this morning shows PCO2 of 69 pH of 7.28 PO2 of 129.  She has significant sputum production and is felt to have chronic bronchitis at baseline and seems to be a bit that she may be having exacerbation now  She has sleep apnea at baseline and is not clear if she is been using her CPAP or not  She has acute on chronic diastolic heart failure with anasarca  She has chronic kidney disease which complicates her situation  She has obesity unchanged  Her potassium  level has been high and has been treated.  She has multisystem abnormalities and is high risk to require intubation and mechanical ventilation Principal Problem:   Acute on chronic respiratory failure with hypoxia and hypercapnia with respiratory acidosis Active Problems:   Obstructive chronic bronchitis without exacerbation (HCC)   OSA on CPAP   Acute on chronic diastolic CHF (congestive heart failure) (HCC)   CKD (chronic kidney disease) stage 3, GFR 30-59 ml/min   Type 2 diabetes with nephropathy (HCC)   Chronic respiratory failure with hypoxia (HCC)   Dyspnea and respiratory abnormalities    Plan: Add nebulizer treatments.  Culture sputum.  Add an antibiotic.  I would switch to IV steroids from p.o.  Recheck blood gas tomorrow.  Use BiPAP during the day based on clinical appearance    LOS: 2 days   Alonza Bogus 06/03/2019, 8:54 AM

## 2019-06-03 NOTE — Progress Notes (Signed)
Patient Demographics:    [name], is a 73 y.o. female, DOB - 11-Jan-1946, WEX:[id number]  Admit date - 05/31/2019   Admitting Physician Nola Botkins [name], MD  Outpatient Primary MD for the patient is [name], MD  LOS - 2   Chief Complaint  Patient presents with  . Abdominal Pain        Subjective:    [name] today has no fevers, no emesis,  No chest pain,   -She used BiPAP overnight -Cough is more productive -Less sleepy   Assessment  & Plan :    Principal Problem:   Acute on chronic respiratory failure with hypoxia and hypercapnia with respiratory acidosis Active Problems:   Acute on chronic diastolic CHF (congestive heart failure) (HCC)   CKD (chronic kidney disease) stage 3, GFR 30-59 ml/min   Obstructive chronic bronchitis without exacerbation (HCC)   OSA on CPAP   Type 2 diabetes with nephropathy (HCC)   Chronic respiratory failure with hypoxia (HCC)   Dyspnea and respiratory abnormalities  Brief Summary 73 y.o. female with medical history significant for coronary artery disease, hypertension, type 2 diabetes mellitus, COPD with chronic hypoxic respiratory failure, chronic kidney disease stage III, and chronic diastolic CHF admitted on 93/81/0175 with worsening volume overload and found to have uncompensated respiratory acidosis/hypercapnic respiratory failure  A/p 1)Acute on chronic hypoxic and hypercapnic respiratory failure--- -Used BiPAP overnight, --Repeat ABG with pH of 7.283 and PCO2 of 69 -Patient will need BiPAP as needed during the day and nightly given persistent uncompensated respiratory acidosis with lethargy -Avoid high flow oxygen -Continue bronchodilators and diuretics -Pulmonology consult from Dr. 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 Luan Pulling appreciated  5)H/o CAD -stable, no chest pains, continue aspirin, Lipitor and Toprol-XL 25 mg daily  6)DM2-recent A1c 7.4,--reflecting fair control,  continue Lantus at Reduced dose of  8 units qhs (was on 16 units PTA)-- - Use Novolog/Humalog Sliding scale insulin with Accu-Cheks / Fingersticks as ordered -Anticipate worsening glycemic control with steroids  7)Gastritis and Large Hiatal Hernia--stable,  -continue Protonix 40 mg daily  8)HTN-stable, Soft BP, Stop Amlodipine, decrease Toprol-XL to 25 from  75 mg daily, hydralazine 25 mg twice daily,    9)FEN--continue full liquid diet until respiratory status improves enough to be off BiPAP  10) morbid obesity/OSA--patient admits to noncompliance with CPAP at home PTA  10)Social/Ethics--- Discussed with Son-  [name]  [phone]--- -Pt is a Full code w/o limitations to treatment at this time  Disposition/Need  for in-Hospital Stay- patient unable to be discharged at this time due to --- hypoxic and hypercapnic respiratory failure requiring BiPAP and IV diuresis  Code Status : Full  Family Communication:    -Discussed with Son  [name]  [phone]---   Disposition Plan  : TBD  Consults  :  Na  DVT Prophylaxis  :   - Heparin - SCDs *  Lab Results  Component Value Date   PLT 234 06/02/2019    Inpatient Medications  Scheduled Meds: . albuterol  2.5 mg Nebulization Q6H  . allopurinol  100 mg Oral BID  . amitriptyline  100 mg Oral QHS  . amLODipine  5 mg Oral Daily  . aspirin EC  81 mg Oral Daily  . atorvastatin  40 mg Oral q1800  . chlorhexidine  15 mL Mouth Rinse BID  . Chlorhexidine Gluconate Cloth  6 each Topical Daily  . fluticasone furoate-vilanterol  1 puff Inhalation Daily   And  . umeclidinium bromide  1 puff Inhalation Daily  . furosemide  40 mg Intravenous Q12H  . guaiFENesin  600 mg Oral BID  . heparin  5,000 Units Subcutaneous Q8H  . hydrALAZINE  25 mg Oral BID  . insulin aspart  0-9 Units Subcutaneous TID WC  . insulin glargine  8 Units Subcutaneous QHS  . mouth rinse  15 mL Mouth Rinse q12n4p  . methylPREDNISolone (SOLU-MEDROL) injection  40 mg Intravenous Q12H  . metoprolol succinate  75 mg Oral q morning - 10a  . pantoprazole  40 mg Oral Daily  . sodium chloride flush  3 mL Intravenous Q12H   Continuous Infusions: . sodium chloride    . cefTRIAXone (ROCEPHIN)  IV 1 g (06/03/19 1052)   PRN Meds:.sodium chloride, acetaminophen, albuterol, ondansetron (ZOFRAN) IV, sodium chloride flush    Anti-infectives (From admission, onward)   Start     Dose/Rate Route Frequency Ordered Stop   06/03/19 1000  cefTRIAXone (ROCEPHIN) 1 g in sodium chloride 0.9 % 100 mL IVPB     1 g 200 mL/hr over 30 Minutes Intravenous Every 24 hours 06/03/19 0902          Objective:   Vitals:   06/03/19 0708 06/03/19 0736 06/03/19 0800 06/03/19 1153  BP: (!) 105/39  (!) 115/39   Pulse: 81  73   Resp: (!) 8  20   Temp:  98.1 F (36.7 C)  98 F (36.7 C)  TempSrc:  Axillary  Oral  SpO2: 100%  97%   Weight:      Height:        Wt Readings from Last 3 Encounters:  06/03/19 110.2 kg  05/31/19 112.9 kg  03/16/19 89.8 kg      Intake/Output Summary (Last 24 hours) at 06/03/2019 1305 Last data filed at 06/03/2019 0800 Gross per 24 hour  Intake -  Output 875 ml  Net -875 ml     Physical Exam  Gen:-Less sleepy, conversational dyspnea HEENT:- [place].AT, No sclera icterus Nose-  2 L/min Neck-Supple Neck, +ve JVD,.  Lungs-diminished in bases with few scattered wheezes CV- S1, S2 normal, regular  Abd-  +ve B.Sounds, increased truncal adiposity, No tenderness,    Extremity/Skin:- 2+  edema, pedal pulses present  Psych-more awake, appropriate affect neuro-generalized weakness no new focal deficits, no tremors   Data Review:   Micro Results Recent Results (from the past 240 hour(s))  SARS CORONAVIRUS 2 (TAT 6-24 HRS) Nasopharyngeal Nasopharyngeal Swab     Status: None  Collection Time: 05/31/19 10:27 PM   Specimen: Nasopharyngeal Swab  Result Value Ref Range Status   SARS Coronavirus 2 NEGATIVE NEGATIVE Final    Comment: (NOTE) SARS-CoV-2 target nucleic acids are NOT DETECTED. The SARS-CoV-2 RNA is generally detectable in upper and lower respiratory specimens during the acute phase of infection. Negative results do not preclude SARS-CoV-2 infection, do not rule out co-infections with other pathogens, and should not be used as the sole basis for treatment or other patient management decisions. Negative results must be combined with clinical observations, patient history, and epidemiological information. The expected result is Negative. Fact Sheet for Patients: SugarRoll.be Fact Sheet for Healthcare Providers: https://www.woods-mathews.com/ This test is not yet approved or cleared by the [place] FDA and  has been authorized for detection and/or diagnosis of SARS-CoV-2 by FDA under an Emergency Use Authorization (EUA). This EUA will remain  in effect (meaning this test can be used) for the duration of the COVID-19 declaration under Section 56 4(b)(1)  of the Act, 21 U.S.C. section 360bbb-3(b)(1), unless the authorization is terminated or revoked sooner. Performed at [facility], [place] [address] [place], [place] [address]   MRSA PCR Screening     Status: None   Collection Time: 06/01/19 11:51 AM   Specimen: Nasopharyngeal  Result Value Ref Range Status   MRSA by PCR NEGATIVE NEGATIVE Final    Comment:        The GeneXpert MRSA Assay (FDA approved for NASAL specimens only), is one component of a comprehensive MRSA colonization surveillance program. It is not intended to diagnose MRSA infection nor to guide or monitor treatment for MRSA infections. Performed at [facility], [address] [place], [place] [address]     Radiology Reports Ct Abdomen Pelvis Wo Contrast  Result Date: 05/31/2019 CLINICAL DATA:  Abdominal distension increasing over the past 3 weeks EXAM: CT ABDOMEN AND PELVIS WITHOUT CONTRAST TECHNIQUE: Multidetector CT imaging of the abdomen and pelvis was performed following the standard protocol without IV contrast. COMPARISON:  05/25/2019 FINDINGS: Lower chest: 5 mm nodule is noted in the medial aspect of the left lower lobe. A few smaller associated nodules are noted. Large hiatal hernia is seen with almost the entire stomach within the chest. Hepatobiliary: No focal liver abnormality is seen. Status post cholecystectomy. No biliary dilatation. Pancreas: Unremarkable. No pancreatic ductal dilatation or surrounding inflammatory changes. Spleen: Normal in size without focal abnormality. Adrenals/Urinary Tract: Adrenal glands are within normal limits. The kidneys demonstrate no definitive renal calculi or obstructive changes. The bladder is partially decompressed. Stomach/Bowel: Changes of diverticulosis are seen within the colon without evidence of diverticulitis. No obstructive or inflammatory changes are noted. The appendix is within normal limits. No small bowel abnormality is seen. The stomach again demonstrates  a large hiatal hernia with wall thickening relatively stable from the prior exam. Vascular/Lymphatic: Aortic atherosclerosis. No enlarged abdominal or pelvic lymph nodes. Reproductive: Status post hysterectomy. No adnexal masses. Other: No abdominal wall hernia or abnormality. No abdominopelvic ascites. Considerable changes of anasarca are again identified within the abdominal wall stable from the prior study. Some laxity of the pelvic floor is noted and stable. Musculoskeletal: Postsurgical and degenerative changes are noted stable from the prior study. IMPRESSION: Large hiatal hernia stable from the prior exam. Some wall thickening in the stomach is noted suspicious for gastritis. No obstructive changes are noted. Chronic changes similar to that seen on the prior exam. Changes of anasarca stable from the previous study. Electronically Signed  By: Inez Catalina M.D.   On: 05/31/2019 20:34   Dg Chest Portable 1 View  Result Date: 05/31/2019 CLINICAL DATA:  Cough EXAM: PORTABLE CHEST 1 VIEW COMPARISON:  Radiographs 07/12/2018, CT chest 07/16/2006, same-day CT abdomen and pelvis FINDINGS: Irregularity along the right heart border likely corresponds to the large hiatal hernia seen on CT of the abdomen pelvis. Hazy areas of basilar atelectasis are present. Additional increased attenuation lung bases likely related to body habitus. No pneumothorax or visible effusion. A right IJ approach Port-A-Cath tip terminates in the mid SVC. There is cardiomegaly which is similar to slightly increased from prior exam though may be accentuated by the portable technique. Slightly lobular configuration of the heart could also reflect small pericardial effusion. The aorta is tortuous. No acute osseous or soft tissue abnormality. Degenerative changes are present in the imaged spine and shoulders. IMPRESSION: 1. Irregularity along the right heart border likely corresponds to the large hiatal hernia seen on CT of the abdomen pelvis.  2. Bibasilar atelectasis. No other acute cardiopulmonary abnormality. Increased attenuation of the bases likely related to body habitus. 3. Slightly lobular configuration of the heart could reflect the small volume pericardial fluid seen on CT abdomen pelvis. Electronically Signed   By: Lovena Le M.D.   On: 05/31/2019 20:46     CBC Recent Labs  Lab 05/31/19 1658 06/02/19 0415  WBC 8.8 6.9  HGB 11.8* 10.6*  HCT 39.9 36.7  PLT 298 234  MCV 91.1 92.7  MCH 26.9 26.8  MCHC 29.6* 28.9*  RDW 14.4 14.1    Chemistries  Recent Labs  Lab 05/31/19 1658 06/02/19 0415  06/02/19 1753 06/02/19 2138 06/03/19 0151 06/03/19 0705 06/03/19 1010  NA 139 142  --   --   --   --  142  --   K 5.6* 6.3*   < > 5.5* 5.4* 5.5* 5.2*  5.2* 5.1  CL 104 105  --   --   --   --  103  --   CO2 27 29  --   --   --   --  29  --   GLUCOSE 131* 134*  --   --   --   --  92  --   BUN 27* 31*  --   --   --   --  42*  --   CREATININE 1.64* 1.80*  --   --   --   --  2.33*  --   CALCIUM 8.6* 8.5*  --   --   --   --  8.4*  --   AST 14*  --   --   --   --   --   --   --   ALT 10  --   --   --   --   --   --   --   ALKPHOS 71  --   --   --   --   --   --   --   BILITOT 1.0  --   --   --   --   --   --   --    < > = values in this interval not displayed.   ------------------------------------------------------------------------------------------------------------------ No results for input(s): CHOL, HDL, LDLCALC, TRIG, CHOLHDL, LDLDIRECT in the last 72 hours.  Lab Results  Component Value Date   HGBA1C 6.7 (H) 07/13/2018   ------------------------------------------------------------------------------------------------------------------ No results for input(s): TSH, T4TOTAL, T3FREE, THYROIDAB in the last 72  hours.  Invalid input(s): FREET3 ------------------------------------------------------------------------------------------------------------------ No results for input(s): VITAMINB12, FOLATE, FERRITIN,  TIBC, IRON, RETICCTPCT in the last 72 hours.  Coagulation profile No results for input(s): INR, PROTIME in the last 168 hours.  No results for input(s): DDIMER in the last 72 hours.  Cardiac Enzymes No results for input(s): CKMB, TROPONINI, MYOGLOBIN in the last 168 hours.  Invalid input(s): CK ------------------------------------------------------------------------------------------------------------------    Component Value Date/Time   BNP 206.0 (H) 05/31/2019 1658     Roxan Hockey M.D on 06/03/2019 at 1:05 PM  Go to [email or web address] - for contact info  [facility] - Office  [phone]

## 2019-06-04 LAB — RENAL FUNCTION PANEL
Albumin: 3 g/dL — ABNORMAL LOW (ref 3.5–5.0)
Anion gap: 11 (ref 5–15)
BUN: 47 mg/dL — ABNORMAL HIGH (ref 8–23)
CO2: 27 mmol/L (ref 22–32)
Calcium: 7.7 mg/dL — ABNORMAL LOW (ref 8.9–10.3)
Chloride: 100 mmol/L (ref 98–111)
Creatinine, Ser: 2.36 mg/dL — ABNORMAL HIGH (ref 0.44–1.00)
GFR calc Af Amer: 23 mL/min — ABNORMAL LOW (ref >60)
GFR calc non Af Amer: 20 mL/min — ABNORMAL LOW (ref >60)
Glucose, Bld: 172 mg/dL — ABNORMAL HIGH (ref 70–99)
Phosphorus: 6.5 mg/dL — ABNORMAL HIGH (ref 2.5–4.6)
Potassium: 4.5 mmol/L (ref 3.5–5.1)
Sodium: 138 mmol/L (ref 135–145)

## 2019-06-04 LAB — GLUCOSE, CAPILLARY
Glucose-Capillary: 170 mg/dL — ABNORMAL HIGH (ref 70–99)
Glucose-Capillary: 166 mg/dL — ABNORMAL HIGH (ref 70–99)
Glucose-Capillary: 152 mg/dL — ABNORMAL HIGH (ref 70–99)
Glucose-Capillary: 144 mg/dL — ABNORMAL HIGH (ref 70–99)

## 2019-06-04 LAB — BASIC METABOLIC PANEL
Anion gap: 13 (ref 5–15)
BUN: 47 mg/dL — ABNORMAL HIGH (ref 8–23)
CO2: 26 mmol/L (ref 22–32)
Calcium: 7.9 mg/dL — ABNORMAL LOW (ref 8.9–10.3)
Chloride: 104 mmol/L (ref 98–111)
Creatinine, Ser: 2.36 mg/dL — ABNORMAL HIGH (ref 0.44–1.00)
GFR calc Af Amer: 23 mL/min — ABNORMAL LOW (ref >60)
GFR calc non Af Amer: 20 mL/min — ABNORMAL LOW (ref >60)
Glucose, Bld: 168 mg/dL — ABNORMAL HIGH (ref 70–99)
Potassium: 4.6 mmol/L (ref 3.5–5.1)
Sodium: 143 mmol/L (ref 135–145)

## 2019-06-04 LAB — BLOOD GAS, ARTERIAL
Acid-Base Excess: 3.9 mmol/L — ABNORMAL HIGH (ref 0.0–2.0)
Bicarbonate: 27 mmol/L (ref 20.0–28.0)
FIO2: 40
O2 Saturation: 95.5 %
Patient temperature: 37
pCO2 arterial: 62.5 mmHg — ABNORMAL HIGH (ref 32.0–48.0)
pH, Arterial: 7.3 — ABNORMAL LOW (ref 7.350–7.450)
pO2, Arterial: 82.3 mmHg — ABNORMAL LOW (ref 83.0–108.0)

## 2019-06-04 LAB — CBC
HCT: 34 % — ABNORMAL LOW (ref 36.0–46.0)
Hemoglobin: 10 g/dL — ABNORMAL LOW (ref 12.0–15.0)
MCH: 26.9 pg (ref 26.0–34.0)
MCHC: 29.4 g/dL — ABNORMAL LOW (ref 30.0–36.0)
MCV: 91.4 fL (ref 80.0–100.0)
Platelets: 203 10*3/uL (ref 150–400)
RBC: 3.72 MIL/uL — ABNORMAL LOW (ref 3.87–5.11)
RDW: 14.3 % (ref 11.5–15.5)
WBC: 4.7 10*3/uL (ref 4.0–10.5)
nRBC: 0 % (ref 0.0–0.2)

## 2019-06-04 MED ORDER — PREDNISONE 20 MG PO TABS
50.0000 mg | ORAL_TABLET | Freq: Every day | ORAL | Status: DC
  Administered 2019-06-05 – 2019-06-08 (×4): 50 mg via ORAL
  Filled 2019-06-04 (×4): qty 1

## 2019-06-04 MED ORDER — INSULIN GLARGINE 100 UNIT/ML ~~LOC~~ SOLN
12.0000 [IU] | Freq: Every day | SUBCUTANEOUS | Status: DC
  Administered 2019-06-04 – 2019-06-07 (×4): 12 [IU] via SUBCUTANEOUS
  Filled 2019-06-04 (×5): qty 0.12

## 2019-06-04 NOTE — Progress Notes (Signed)
   Persistent hypercapnia with uncompensated respiratory acidosis despite BiPAP use  in the hospital consider NIV/Trilogy upon discharge home -NIV/Trilogy Requirement Statement:- Patient has severe/end-stage COPD with chronic hypoxic and hypercapnic respiratory failure.  Despite aggressive treatment with BiPAP here in the hospital, patient continues to exhibit signs of significant hypercapnia associated with chronic respiratory failure secondary to severe/end-stage COPD with uncompensated respiratory acidosis.  Patient requires the use of NIV both nightly and daytime to help with exacerbation periods.  The use of the NIV will treat patient's high PCO2 levels (please see ABG results on Bipap), and use of NIV can reduce risk of exacerbation in future hospitalizations when used at night and during the day.  Patient will need these advanced settings in conjunction with the current medication regimen and aggressive pulmonary treatment: BiPAP is not an option due to her functional limitations and the severity of the patient's condition.   Failure to have an IV available for use  could lead to death.   patient had significant episodes of severe lethargy and unresponsiveness due to very very high CO2 levels despite BiPAP use here in the hospital  Roxan Hockey, MD

## 2019-06-04 NOTE — Progress Notes (Signed)
Patient Demographics:    [name], is a 73 y.o. female, DOB - Aug 31, 1945, GXQ:[id number]  Admit date - 05/31/2019   Admitting Physician Kristyn Obyrne [name], MD  Outpatient Primary MD for the patient is [name], MD  LOS - 3   Chief Complaint  Patient presents with  . Abdominal Pain        Subjective:    [name] today has no fevers, no emesis,  No chest pain,   -She used BiPAP overnight -Cough is more productive -Despite consistent BiPAP use in the hospital here patient remains lethargic with elevated PCO2 and uncompensated respiratory acidosis   Assessment  & Plan :    Principal Problem:   Acute on chronic respiratory failure with hypoxia and hypercapnia with respiratory acidosis Active Problems:   Acute on chronic diastolic CHF (congestive heart failure) (HCC)   CKD (chronic kidney disease) stage 3, GFR 30-59 ml/min   Obstructive chronic bronchitis without exacerbation (HCC)   OSA on CPAP   Type 2 diabetes with nephropathy (HCC)   Chronic respiratory failure with hypoxia (HCC)   Dyspnea and respiratory abnormalities  Brief Summary 73 y.o. female with medical history significant for coronary artery disease, hypertension, type 2 diabetes mellitus, COPD with chronic hypoxic respiratory failure, chronic kidney disease stage III, and chronic diastolic CHF admitted on 14/48/1856 with worsening volume overload and found to have uncompensated respiratory acidosis/hypercapnic respiratory failure  A/p 1)Acute on chronic hypoxic and hypercapnic respiratory failure--- -Used BiPAP overnight, --Repeat ABG with pH of 7.30 and PCO2 of 63 -Patient will need BiPAP as needed during the day and nightly given persistent uncompensated respiratory acidosis with lethargy -Avoid high flow oxygen -Continue bronchodilators and diuretics -Pulmonology consult from Dr. Luan Pulling appreciated  -Persistent hypercapnia with uncompensated respiratory acidosis despite BiPAP use  in the hospital consider NIV/Trilogy upon discharge home -NIV/Trilogy Requirement Statement:- Patient has severe/end-stage COPD with chronic hypoxic and hypercapnic respiratory failure.  Despite aggressive treatment with BiPAP here in the hospital, patient continues to exhibit signs of significant hypercapnia associated with chronic respiratory failure secondary to severe/end-stage COPD with uncompensated respiratory acidosis.  Patient requires the use of NIV both nightly and daytime to help with exacerbation periods.  The use of the NIV will treat patient's high PCO2 levels (please see ABG results on Bipap), and use of NIV can reduce risk of exacerbation in future hospitalizations when used at night and during the day.  Patient will need these advanced settings in conjunction with the current medication regimen and aggressive pulmonary treatment: BiPAP is not an option due to her functional limitations and the severity of the patient's condition.   Failure to have an IV available for use  could lead to death.   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 -with hyperkalemia --  Creatinine is worse--trending up  with Diuresis  (up to 2.36  from 1.64 )-- -Potassium down to 4.5 from 6.3, after 2 doses of Lokelma, AND  kayexalate -Creatinine baseline usually around 2 , renally adjust medications, avoid nephrotoxic agents / dehydration / hypotension  4) COPD with chronic hypoxic respiratory failure--contributing to #1  above -Continue bronchodilators, avoid high flow O2 due to hypercapnia -Okay to switch from IV Solu-Medrol to prednisone on 06/05/2019 --Patient with productive cough with colored sputum, Rocephin and doxycycline added 06/03/2019 -Pulmonology consult from Dr. Luan Pulling appreciated  5)H/o CAD -stable, no chest pains, continue aspirin, Lipitor and Toprol-XL 25 mg daily  6)DM2-recent A1c 7.4,--reflecting fair control,  Change Lantus to 12 units units qhs (was on 16 units PTA)-- - Use Novolog/Humalog Sliding scale insulin with Accu-Cheks / Fingersticks as ordered -Anticipate improvement in glycemic control with steroid taper  7)Gastritis and Large Hiatal Hernia--stable,  -continue Protonix 40 mg daily  8)HTN-stable, Soft BP, improved after medication changes continue to hold  Amlodipine, decrease Toprol-XL to 25 from  75 mg daily, hydralazine 25 mg twice daily,  9)FEN--continue full liquid diet until respiratory status improves enough to be off BiPAP -May start soft diet on 06/05/2019  10) morbid obesity/OSA--Persistent hypercapnia with uncompensated respiratory acidosis despite BiPAP use  in the hospital consider NIV/Trilogy upon discharge home  10) chronic anemia--- hemoglobin currently around 10 which is close to patient's baseline -No evidence of ongoing bleeding  11)Social/Ethics--- Discussed with Son-  Mr. [name]  [phone]--- -Pt is a Full code w/o limitations to treatment at this time  Disposition/Need for in-Hospital Stay- patient unable to be discharged at this time due to --- hypoxic and hypercapnic respiratory failure requiring BiPAP and IV diuresis -May be a candidate for NIV/trilogy machine upon discharge home  Code Status : Full  Family Communication:    -Discussed with Son  Mr. [name]  [phone]---  Disposition Plan  : TBD  Consults  :  Na  DVT Prophylaxis  :   - Heparin - SCDs *  Lab Results  Component Value Date   PLT 203 06/04/2019     Inpatient Medications  Scheduled Meds: . albuterol  2.5 mg Nebulization Q6H  . allopurinol  100 mg Oral BID  . amitriptyline  100 mg Oral QHS  . aspirin EC  81 mg Oral Daily  . atorvastatin  40 mg Oral q1800  . chlorhexidine  15 mL Mouth Rinse BID  . Chlorhexidine Gluconate Cloth  6 each Topical Daily  . doxycycline  100 mg Oral BID  . fluticasone furoate-vilanterol  1 puff Inhalation Daily   And  . umeclidinium bromide  1 puff Inhalation Daily  . furosemide  40 mg Intravenous Q12H  . guaiFENesin  600 mg Oral BID  . heparin  5,000 Units Subcutaneous Q8H  . hydrALAZINE  25 mg Oral BID  . insulin aspart  0-9 Units Subcutaneous TID WC  . insulin glargine  8 Units Subcutaneous QHS  . mouth rinse  15 mL Mouth Rinse q12n4p  . methylPREDNISolone (SOLU-MEDROL) injection  40 mg Intravenous Q12H  . metoprolol succinate  75 mg Oral q morning - 10a  . pantoprazole  40 mg Oral Daily  . sodium chloride flush  3 mL Intravenous Q12H   Continuous Infusions: . sodium chloride    . cefTRIAXone (ROCEPHIN)  IV 1 g (06/04/19 0857)   PRN Meds:.sodium chloride, acetaminophen, albuterol, ondansetron (ZOFRAN) IV, sodium chloride flush    Anti-infectives (From admission, onward)   Start     Dose/Rate Route Frequency Ordered Stop  06/03/19 1530  doxycycline (VIBRA-TABS) tablet 100 mg     100 mg Oral 2 times daily 06/03/19 1523     06/03/19 1000  cefTRIAXone (ROCEPHIN) 1 g in sodium chloride 0.9 % 100 mL IVPB     1 g 200 mL/hr over 30 Minutes Intravenous Every 24 hours 06/03/19 0902          Objective:   Vitals:   06/04/19 0800 06/04/19 0850 06/04/19 1138 06/04/19 1200  BP: (!) 145/53 (!) 131/44  (!) 137/59  Pulse: 94 94  85  Resp: 11 (!) 8    Temp:   98.1 F (36.7 C)   TempSrc:   Oral   SpO2: 98% 95%  99%  Weight:      Height:        Wt Readings from Last 3 Encounters:  06/04/19 109.8 kg  05/31/19 112.9 kg  03/16/19 89.8 kg     Intake/Output Summary (Last 24 hours) at  06/04/2019 1417 Last data filed at 06/03/2019 2200 Gross per 24 hour  Intake 420 ml  Output 250 ml  Net 170 ml     Physical Exam  Gen:-Morbidly obese, less sleepy, conversational dyspnea persist HEENT:- [place].AT, No sclera icterus Nose- [place] 2 L/min Neck-Supple Neck, +ve JVD,.  Lungs-diminished in bases with few scattered wheezes CV- S1, S2 normal, regular , right-sided Port-A-Cath site is clean dry and intact Abd-  +ve B.Sounds, increased truncal adiposity, No tenderness,    Extremity/Skin:- 1+  edema, pedal pulses present  Psych-more awake, appropriate affect neuro-generalized weakness,  no new focal deficits, no tremors   Data Review:   Micro Results Recent Results (from the past 240 hour(s))  SARS CORONAVIRUS 2 (TAT 6-24 HRS) Nasopharyngeal Nasopharyngeal Swab     Status: None   Collection Time: 05/31/19 10:27 PM   Specimen: Nasopharyngeal Swab  Result Value Ref Range Status   SARS Coronavirus 2 NEGATIVE NEGATIVE Final    Comment: (NOTE) SARS-CoV-2 target nucleic acids are NOT DETECTED. The SARS-CoV-2 RNA is generally detectable in upper and lower respiratory specimens during the acute phase of infection. Negative results do not preclude SARS-CoV-2 infection, do not rule out co-infections with other pathogens, and should not be used as the sole basis for treatment or other patient management decisions. Negative results must be combined with clinical observations, patient history, and epidemiological information. The expected result is Negative. Fact Sheet for Patients: SugarRoll.be Fact Sheet for Healthcare Providers: https://www.woods-mathews.com/ This test is not yet approved or cleared by the [place] FDA and  has been authorized for detection and/or diagnosis of SARS-CoV-2 by FDA under an Emergency Use Authorization (EUA). This EUA will remain  in effect (meaning this test can be used) for the duration of the COVID-19  declaration under Section 56 4(b)(1) of the Act, 21 U.S.C. section 360bbb-3(b)(1), unless the authorization is terminated or revoked sooner. Performed at [facility], [place] [address] [place], [place] [address]   MRSA PCR Screening     Status: None   Collection Time: 06/01/19 11:51 AM   Specimen: Nasopharyngeal  Result Value Ref Range Status   MRSA by PCR NEGATIVE NEGATIVE Final    Comment:        The GeneXpert MRSA Assay (FDA approved for NASAL specimens only), is one component of a comprehensive MRSA colonization surveillance program. It is not intended to diagnose MRSA infection nor to guide or monitor treatment for MRSA infections. Performed at [facility], [address], [place], [place] [address]  Radiology Reports Ct Abdomen Pelvis Wo Contrast  Result Date: 05/31/2019 CLINICAL DATA:  Abdominal distension increasing over the past 3 weeks EXAM: CT ABDOMEN AND PELVIS WITHOUT CONTRAST TECHNIQUE: Multidetector CT imaging of the abdomen and pelvis was performed following the standard protocol without IV contrast. COMPARISON:  05/25/2019 FINDINGS: Lower chest: 5 mm nodule is noted in the medial aspect of the left lower lobe. A few smaller associated nodules are noted. Large hiatal hernia is seen with almost the entire stomach within the chest. Hepatobiliary: No focal liver abnormality is seen. Status post cholecystectomy. No biliary dilatation. Pancreas: Unremarkable. No pancreatic ductal dilatation or surrounding inflammatory changes. Spleen: Normal in size without focal abnormality. Adrenals/Urinary Tract: Adrenal glands are within normal limits. The kidneys demonstrate no definitive renal calculi or obstructive changes. The bladder is partially decompressed. Stomach/Bowel: Changes of diverticulosis are seen within the colon without evidence of diverticulitis. No obstructive or inflammatory changes are noted. The appendix is within normal limits. No small bowel abnormality  is seen. The stomach again demonstrates a large hiatal hernia with wall thickening relatively stable from the prior exam. Vascular/Lymphatic: Aortic atherosclerosis. No enlarged abdominal or pelvic lymph nodes. Reproductive: Status post hysterectomy. No adnexal masses. Other: No abdominal wall hernia or abnormality. No abdominopelvic ascites. Considerable changes of anasarca are again identified within the abdominal wall stable from the prior study. Some laxity of the pelvic floor is noted and stable. Musculoskeletal: Postsurgical and degenerative changes are noted stable from the prior study. IMPRESSION: Large hiatal hernia stable from the prior exam. Some wall thickening in the stomach is noted suspicious for gastritis. No obstructive changes are noted. Chronic changes similar to that seen on the prior exam. Changes of anasarca stable from the previous study. Electronically Signed   By: Inez Catalina M.D.   On: 05/31/2019 20:34   Dg Chest Portable 1 View  Result Date: 05/31/2019 CLINICAL DATA:  Cough EXAM: PORTABLE CHEST 1 VIEW COMPARISON:  Radiographs 07/12/2018, CT chest 07/16/2006, same-day CT abdomen and pelvis FINDINGS: Irregularity along the right heart border likely corresponds to the large hiatal hernia seen on CT of the abdomen pelvis. Hazy areas of basilar atelectasis are present. Additional increased attenuation lung bases likely related to body habitus. No pneumothorax or visible effusion. A right IJ approach Port-A-Cath tip terminates in the mid SVC. There is cardiomegaly which is similar to slightly increased from prior exam though may be accentuated by the portable technique. Slightly lobular configuration of the heart could also reflect small pericardial effusion. The aorta is tortuous. No acute osseous or soft tissue abnormality. Degenerative changes are present in the imaged spine and shoulders. IMPRESSION: 1. Irregularity along the right heart border likely corresponds to the large hiatal  hernia seen on CT of the abdomen pelvis. 2. Bibasilar atelectasis. No other acute cardiopulmonary abnormality. Increased attenuation of the bases likely related to body habitus. 3. Slightly lobular configuration of the heart could reflect the small volume pericardial fluid seen on CT abdomen pelvis. Electronically Signed   By: Lovena Le M.D.   On: 05/31/2019 20:46     CBC Recent Labs  Lab 05/31/19 1658 06/02/19 0415 06/04/19 0501  WBC 8.8 6.9 4.7  HGB 11.8* 10.6* 10.0*  HCT 39.9 36.7 34.0*  PLT 298 234 203  MCV 91.1 92.7 91.4  MCH 26.9 26.8 26.9  MCHC 29.6* 28.9* 29.4*  RDW 14.4 14.1 14.3    Chemistries  Recent Labs  Lab 05/31/19 1658 06/02/19 0415  06/03/19 0705 06/03/19 1010 06/03/19 1416  06/03/19 1807 06/04/19 0501  NA 139 142  --  142  --   --   --  138  143  K 5.6* 6.3*   < > 5.2*  5.2* 5.1 5.1 5.3* 4.5  4.6  CL 104 105  --  103  --   --   --  100  104  CO2 27 29  --  29  --   --   --  27  26  GLUCOSE 131* 134*  --  92  --   --   --  172*  168*  BUN 27* 31*  --  42*  --   --   --  47*  47*  CREATININE 1.64* 1.80*  --  2.33*  --   --   --  2.36*  2.36*  CALCIUM 8.6* 8.5*  --  8.4*  --   --   --  7.7*  7.9*  AST 14*  --   --   --   --   --   --   --   ALT 10  --   --   --   --   --   --   --   ALKPHOS 71  --   --   --   --   --   --   --   BILITOT 1.0  --   --   --   --   --   --   --    < > = values in this interval not displayed.   ------------------------------------------------------------------------------------------------------------------ No results for input(s): CHOL, HDL, LDLCALC, TRIG, CHOLHDL, LDLDIRECT in the last 72 hours.  Lab Results  Component Value Date   HGBA1C 6.7 (H) 07/13/2018   ------------------------------------------------------------------------------------------------------------------ No results for input(s): TSH, T4TOTAL, T3FREE, THYROIDAB in the last 72 hours.  Invalid input(s): FREET3  ------------------------------------------------------------------------------------------------------------------ No results for input(s): VITAMINB12, FOLATE, FERRITIN, TIBC, IRON, RETICCTPCT in the last 72 hours.  Coagulation profile No results for input(s): INR, PROTIME in the last 168 hours.  No results for input(s): DDIMER in the last 72 hours.  Cardiac Enzymes No results for input(s): CKMB, TROPONINI, MYOGLOBIN in the last 168 hours.  Invalid input(s): CK ------------------------------------------------------------------------------------------------------------------    Component Value Date/Time   BNP 206.0 (H) 05/31/2019 1658     Roxan Hockey M.D on 06/04/2019 at 2:17 PM  Go to [email or web address] - for contact info  [facility] - Office  [phone]

## 2019-06-04 NOTE — Progress Notes (Signed)
Subjective: She is sleepy but arousable.  She is on CPAP.  No new complaints.  Her blood gas looks marginally better and her PCO2 has come down some  Objective: Vital signs in last 24 hours: Temp:  [97.5 F (36.4 C)-98.5 F (36.9 C)] 98.5 F (36.9 C) (10/30 0400) Pulse Rate:  [70-98] 93 (10/30 0423) Resp:  [0-31] 0 (10/30 0423) BP: (90-137)/(23-71) 134/54 (10/30 0400) SpO2:  [91 %-100 %] 97 % (10/30 0423) Weight:  [109.8 kg] 109.8 kg (10/30 0500) Weight change: -0.4 kg Last BM Date: 06/03/19  Intake/Output from previous day: 10/29 0701 - 10/30 0700 In: 420 [P.O.:320; IV Piggyback:100] Out: 1000 [Urine:1000]  PHYSICAL EXAM General appearance: Obese, sleepy, on positive pressure Resp: She still has some rhonchi and rales bilaterally Cardio: Her heart is regular and I do not hear a gallop GI: soft, non-tender; bowel sounds normal; no masses,  no organomegaly Extremities: She still has some swelling of both legs  Lab Results:  Results for orders placed or performed during the hospital encounter of 05/31/19 (from the past 48 hour(s))  Glucose, capillary     Status: Abnormal   Collection Time: 06/02/19  7:47 AM  Result Value Ref Range   Glucose-Capillary 142 (H) 70 - 99 mg/dL  Blood gas, arterial     Status: Abnormal   Collection Time: 06/02/19  9:30 AM  Result Value Ref Range   FIO2 26.00    pH, Arterial 7.276 (L) 7.350 - 7.450   pCO2 arterial 63.8 (H) 32.0 - 48.0 mmHg   pO2, Arterial 70.9 (L) 83.0 - 108.0 mmHg   Bicarbonate 25.5 20.0 - 28.0 mmol/L   Acid-Base Excess 2.5 (H) 0.0 - 2.0 mmol/L   O2 Saturation 93.1 %   Patient temperature 37.0    Allens test (pass/fail) PASS PASS    Comment: Performed at [facility], [address], [place], [place] [address]  Potassium     Status: Abnormal   Collection Time: 06/02/19  9:56 AM  Result Value Ref Range   Potassium 5.8 (H) 3.5 - 5.1 mmol/L    Comment: Performed at [facility], [address] [place], [place] [address]   Glucose, capillary     Status: Abnormal   Collection Time: 06/02/19 11:42 AM  Result Value Ref Range   Glucose-Capillary 124 (H) 70 - 99 mg/dL  Potassium     Status: Abnormal   Collection Time: 06/02/19  2:10 PM  Result Value Ref Range   Potassium 5.6 (H) 3.5 - 5.1 mmol/L    Comment: Performed at [facility], [address], [place], [place] [address]  Glucose, capillary     Status: Abnormal   Collection Time: 06/02/19  4:57 PM  Result Value Ref Range   Glucose-Capillary 114 (H) 70 - 99 mg/dL  Potassium     Status: Abnormal   Collection Time: 06/02/19  5:53 PM  Result Value Ref Range   Potassium 5.5 (H) 3.5 - 5.1 mmol/L    Comment: Performed at [facility], [address], [place], [place] [address]  Potassium     Status: Abnormal   Collection Time: 06/02/19  9:38 PM  Result Value Ref Range   Potassium 5.4 (H) 3.5 - 5.1 mmol/L    Comment: Performed at [facility], [address], [place], [place] [address]  Glucose, capillary     Status: Abnormal   Collection Time: 06/02/19  9:44 PM  Result Value Ref Range   Glucose-Capillary 105 (H) 70 - 99 mg/dL  Comment 1 Notify RN    Comment 2 Document in Chart   Potassium     Status: Abnormal   Collection Time: 06/03/19  1:51 AM  Result Value Ref Range   Potassium 5.5 (H) 3.5 - 5.1 mmol/L    Comment: Performed at [facility], [address] [place], [place] [address]  Blood gas, arterial     Status: Abnormal   Collection Time: 06/03/19  4:14 AM  Result Value Ref Range   FIO2 36.00    Delivery systems BILEVEL POSITIVE AIRWAY PRESSURE    Inspiratory PAP 10    Expiratory PAP 5    pH, Arterial 7.283 (L) 7.350 - 7.450   pCO2 arterial 69.1 (HH) 32.0 - 48.0 mmHg    Comment: CRITICAL RESULT CALLED TO, READ BACK BY AND VERIFIED WITH: H TETREAULT,RN @0435  06/03/19 MKELLY    pO2, Arterial 129 (H) 83.0 - 108.0 mmHg   Bicarbonate 28.1 (H) 20.0 - 28.0 mmol/L   Acid-Base Excess 5.3 (H) 0.0 - 2.0 mmol/L   O2 Saturation 98.8 %   Patient  temperature 36.8    Collection site LEFT RADIAL    Drawn by [id number]    Allens test (pass/fail) PASS PASS    Comment: Performed at [facility], [address] [place], [place] [address]  Basic metabolic panel     Status: Abnormal   Collection Time: 06/03/19  7:05 AM  Result Value Ref Range   Sodium 142 135 - 145 mmol/L   Potassium 5.2 (H) 3.5 - 5.1 mmol/L   Chloride 103 98 - 111 mmol/L   CO2 29 22 - 32 mmol/L   Glucose, Bld 92 70 - 99 mg/dL   BUN 42 (H) 8 - 23 mg/dL   Creatinine, Ser 2.33 (H) 0.44 - 1.00 mg/dL   Calcium 8.4 (L) 8.9 - 10.3 mg/dL   GFR calc non Af Amer 20 (L) >60 mL/min   GFR calc Af Amer 23 (L) >60 mL/min   Anion gap 10 5 - 15    Comment: Performed at [facility], [address] [place], [place] [address]  Potassium     Status: Abnormal   Collection Time: 06/03/19  7:05 AM  Result Value Ref Range   Potassium 5.2 (H) 3.5 - 5.1 mmol/L    Comment: Performed at [facility], [address], [place], [place] [address]  Glucose, capillary     Status: None   Collection Time: 06/03/19  7:40 AM  Result Value Ref Range   Glucose-Capillary 86 70 - 99 mg/dL  Potassium     Status: None   Collection Time: 06/03/19 10:10 AM  Result Value Ref Range   Potassium 5.1 3.5 - 5.1 mmol/L    Comment: Performed at [facility], [address] [place], [place] [address]  Glucose, capillary     Status: None   Collection Time: 06/03/19 12:00 PM  Result Value Ref Range   Glucose-Capillary 88 70 - 99 mg/dL  Potassium     Status: None   Collection Time: 06/03/19  2:16 PM  Result Value Ref Range   Potassium 5.1 3.5 - 5.1 mmol/L    Comment: Performed at [facility], [address] [place],  [address]  Glucose, capillary     Status: Abnormal   Collection Time: 06/03/19  5:11 PM  Result Value Ref Range   Glucose-Capillary 102 (H) 70 - 99 mg/dL  Potassium     Status: Abnormal   Collection Time: 06/03/19  6:07 PM  Result  Value Ref Range   Potassium 5.3 (H) 3.5 - 5.1 mmol/L     Comment: Performed at [facility], [address], [place], [place] [address]  Glucose, capillary     Status: Abnormal   Collection Time: 06/03/19  9:21 PM  Result Value Ref Range   Glucose-Capillary 142 (H) 70 - 99 mg/dL   Comment 1 Notify RN    Comment 2 Document in Chart   Blood gas, arterial     Status: Abnormal   Collection Time: 06/04/19  5:00 AM  Result Value Ref Range   FIO2 40.00    pH, Arterial 7.300 (L) 7.350 - 7.450   pCO2 arterial 62.5 (H) 32.0 - 48.0 mmHg   pO2, Arterial 82.3 (L) 83.0 - 108.0 mmHg   Bicarbonate 27.0 20.0 - 28.0 mmol/L   Acid-Base Excess 3.9 (H) 0.0 - 2.0 mmol/L   O2 Saturation 95.5 %   Patient temperature 37.0    Allens test (pass/fail) PASS PASS    Comment: Performed at [facility], [address], [place], [place] [address]  Basic metabolic panel     Status: Abnormal   Collection Time: 06/04/19  5:01 AM  Result Value Ref Range   Sodium 143 135 - 145 mmol/L   Potassium 4.6 3.5 - 5.1 mmol/L   Chloride 104 98 - 111 mmol/L   CO2 26 22 - 32 mmol/L   Glucose, Bld 168 (H) 70 - 99 mg/dL   BUN 47 (H) 8 - 23 mg/dL   Creatinine, Ser 2.36 (H) 0.44 - 1.00 mg/dL   Calcium 7.9 (L) 8.9 - 10.3 mg/dL   GFR calc non Af Amer 20 (L) >60 mL/min   GFR calc Af Amer 23 (L) >60 mL/min   Anion gap 13 5 - 15    Comment: Performed at [facility], [name] [place], [place] [address]  CBC     Status: Abnormal   Collection Time: 06/04/19  5:01 AM  Result Value Ref Range   WBC 4.7 4.0 - 10.5 K/uL   RBC 3.72 (L) 3.87 - 5.11 MIL/uL   Hemoglobin 10.0 (L) 12.0 - 15.0 g/dL   HCT 34.0 (L) 36.0 - 46.0 %   MCV 91.4 80.0 - 100.0 fL   MCH 26.9 26.0 - 34.0 pg   MCHC 29.4 (L) 30.0 - 36.0 g/dL   RDW 14.3 11.5 - 15.5 %   Platelets 203 150 - 400 K/uL   nRBC 0.0 0.0 - 0.2 %    Comment: Performed at [facility], [address] [place], [place] [address]  Renal function panel     Status: Abnormal   Collection Time: 06/04/19  5:01 AM  Result Value Ref Range   Sodium 138 135 -  145 mmol/L   Potassium 4.5 3.5 - 5.1 mmol/L   Chloride 100 98 - 111 mmol/L   CO2 27 22 - 32 mmol/L   Glucose, Bld 172 (H) 70 - 99 mg/dL   BUN 47 (H) 8 - 23 mg/dL   Creatinine, Ser 2.36 (H) 0.44 - 1.00 mg/dL   Calcium 7.7 (L) 8.9 - 10.3 mg/dL   Phosphorus 6.5 (H) 2.5 - 4.6 mg/dL   Albumin 3.0 (L) 3.5 - 5.0 g/dL   GFR calc non Af Amer 20 (L) >60 mL/min   GFR calc Af Amer 23 (L) >60 mL/min   Anion gap 11 5 - 15    Comment: Performed at [facility], [address], [place],  [address]    ABGS  Recent Labs    06/04/19 0500  PHART 7.300*  PO2ART 82.3*  HCO3 27.0   CULTURES Recent Results (from the past 240 hour(s))  SARS CORONAVIRUS 2 (TAT 6-24 HRS) Nasopharyngeal Nasopharyngeal Swab     Status: None   Collection Time: 05/31/19 10:27 PM   Specimen: Nasopharyngeal Swab  Result Value Ref Range Status   SARS Coronavirus 2 NEGATIVE NEGATIVE Final    Comment: (NOTE) SARS-CoV-2 target nucleic acids are NOT DETECTED. The SARS-CoV-2 RNA is generally detectable in upper and lower respiratory specimens during the acute phase of infection. Negative results do not preclude SARS-CoV-2 infection, do not rule out co-infections with other pathogens, and should not be used as the sole basis for treatment or other patient management decisions. Negative results must be combined with clinical observations, patient history, and epidemiological information. The expected result is Negative. Fact Sheet for Patients: SugarRoll.be Fact Sheet for Healthcare Providers: https://www.woods-mathews.com/ This test is not yet approved or cleared by the [place] FDA and  has been authorized for detection and/or diagnosis of SARS-CoV-2 by FDA under an Emergency Use Authorization (EUA). This EUA will remain  in effect (meaning this test can be used) for the duration of the COVID-19 declaration under Section 56 4(b)(1) of the Act, 21 U.S.C. section  360bbb-3(b)(1), unless the authorization is terminated or revoked sooner. Performed at [facility], [place] [address] [place], [place] [address]   MRSA PCR Screening     Status: None   Collection Time: 06/01/19 11:51 AM   Specimen: Nasopharyngeal  Result Value Ref Range Status   MRSA by PCR NEGATIVE NEGATIVE Final    Comment:        The GeneXpert MRSA Assay (FDA approved for NASAL specimens only), is one component of a comprehensive MRSA colonization surveillance program. It is not intended to diagnose MRSA infection nor to guide or monitor treatment for MRSA infections. Performed at [facility], [address] [place], [place] [address]    Studies/Results: No results found.  Medications:  Prior to Admission:  Medications Prior to Admission  Medication Sig Dispense Refill Last Dose  . albuterol (PROVENTIL) (2.5 MG/3ML) 0.083% nebulizer solution Take 2.5 mg by nebulization every 6 (six) hours as needed for wheezing or shortness of breath.    Past Month at Unknown time  . albuterol (VENTOLIN HFA) 108 (90 Base) MCG/ACT inhaler Inhale 2 puffs into the lungs every 6 (six) hours as needed for wheezing or shortness of breath.    05/30/2019 at Unknown time  . allopurinol (ZYLOPRIM) 100 MG tablet Take 1 tablet by mouth 2 (two) times daily.   05/31/2019 at Unknown time  . amitriptyline (ELAVIL) 100 MG tablet Take 100 mg by mouth at bedtime.     05/30/2019 at Unknown time  . amLODipine (NORVASC) 5 MG tablet Take 1 tablet (5 mg total) by mouth daily. 90 tablet 3 05/31/2019 at Unknown time  . aspirin EC 81 MG tablet Take 81 mg by mouth daily.   05/31/2019 at Unknown time  . atorvastatin (LIPITOR) 40 MG tablet Take 1 tablet (40 mg total) by mouth at bedtime. 90 tablet 1 05/30/2019 at Unknown time  . BIOTIN PO Take 1 capsule by mouth daily.   05/31/2019 at Unknown time  . esomeprazole (NEXIUM) 40 MG capsule TAKE 1 CAPSULE TWICE A DAY 30 MINUTES PRIOR TO MEALS (Patient taking differently:  Take 40 mg by mouth every morning. *May take one additional capsule if needed) 180 capsule 1 05/31/2019 at Unknown  time  . famotidine (PEPCID) 40 MG tablet TAKE 1 TABLET AT BEDTIME AS NEEDED TO CONTROL REFLUX (Patient taking differently: Take 40 mg by mouth at bedtime as needed for heartburn or indigestion. ) 90 tablet 1 Past Week at Unknown time  . furosemide (LASIX) 20 MG tablet Take 2 tablets (40 mg total) by mouth daily. Take 80 mg alternating with 60 mg every other day (Patient taking differently: Take 40 mg by mouth daily. ) 30 tablet 2 05/31/2019 at Unknown time  . hydrALAZINE (APRESOLINE) 25 MG tablet Take 1 tablet (25 mg total) by mouth 2 (two) times daily. 60 tablet 6 05/31/2019 at Unknown time  . insulin lispro (HUMALOG) 100 UNIT/ML KiwkPen Inject 14-16 Units into the skin See admin instructions.    05/31/2019 at Unknown time  . LANTUS SOLOSTAR 100 UNIT/ML SOPN Inject 16 Units into the skin at bedtime.    05/30/2019 at Unknown time  . loperamide (IMODIUM) 2 MG capsule Take 2 mg by mouth as needed for diarrhea or loose stools.   unknown  . meclizine (ANTIVERT) 25 MG tablet Take 1 tablet (25 mg total) by mouth 3 (three) times daily as needed for dizziness. 30 tablet 0 Past Month at Unknown time  . metoprolol succinate (TOPROL-XL) 50 MG 24 hr tablet Take 75 mg by mouth daily. Take with or immediately following a meal. (Patient taking differently: Take 75 mg by mouth every morning. ) 135 tablet 1 05/31/2019 at 900  . spironolactone (ALDACTONE) 50 MG tablet Take 50 mg by mouth daily.   05/31/2019 at Unknown time  . TRELEGY ELLIPTA 100-62.5-25 MCG/INH AEPB Inhale 1 puff into the lungs daily.   05/30/2019 at Unknown time   Scheduled: . albuterol  2.5 mg Nebulization Q6H  . allopurinol  100 mg Oral BID  . amitriptyline  100 mg Oral QHS  . aspirin EC  81 mg Oral Daily  . atorvastatin  40 mg Oral q1800  . chlorhexidine  15 mL Mouth Rinse BID  . Chlorhexidine Gluconate Cloth  6 each Topical Daily   . doxycycline  100 mg Oral BID  . fluticasone furoate-vilanterol  1 puff Inhalation Daily   And  . umeclidinium bromide  1 puff Inhalation Daily  . furosemide  40 mg Intravenous Q12H  . guaiFENesin  600 mg Oral BID  . heparin  5,000 Units Subcutaneous Q8H  . hydrALAZINE  25 mg Oral BID  . insulin aspart  0-9 Units Subcutaneous TID WC  . insulin glargine  8 Units Subcutaneous QHS  . mouth rinse  15 mL Mouth Rinse q12n4p  . methylPREDNISolone (SOLU-MEDROL) injection  40 mg Intravenous Q12H  . metoprolol succinate  75 mg Oral q morning - 10a  . pantoprazole  40 mg Oral Daily  . sodium chloride flush  3 mL Intravenous Q12H   Continuous: . sodium chloride    . cefTRIAXone (ROCEPHIN)  IV 1 g (06/03/19 1052)   IOM:BTDHRC chloride, acetaminophen, albuterol, ondansetron (ZOFRAN) IV, sodium chloride flush  Assesment: She was admitted with acute on chronic diastolic heart failure and has acute on chronic hypoxic and hypercapnic respiratory failure.  She has obstructive sleep apnea at baseline.  She still shows evidence of respiratory acidosis with pH of about 7.3 this morning.  She has heart failure and is approximately 2 L negative since admission  She has stage III chronic kidney disease which is unchanged Principal Problem:   Acute on chronic respiratory failure with hypoxia and hypercapnia with respiratory acidosis Active Problems:  Obstructive chronic bronchitis without exacerbation (HCC)   OSA on CPAP   Acute on chronic diastolic CHF (congestive heart failure) (HCC)   CKD (chronic kidney disease) stage 3, GFR 30-59 ml/min   Type 2 diabetes with nephropathy (HCC)   Chronic respiratory failure with hypoxia (HCC)   Dyspnea and respiratory abnormalities    Plan: Continue treatments.  CPAP/BiPAP at night.  Transition to nasal cannula during the day and placed back on pressure based on clinical assessment    LOS: 3 days   [name] 06/04/2019, 7:00 AM

## 2019-06-04 NOTE — Care Management Important Message (Signed)
Important Message  Patient Details  Name: [name] MRN: [id number] Date of Birth: 27-Jul-1946   Medicare Important Message Given:  Yes     Tommy Medal 06/04/2019, 4:27 PM

## 2019-06-05 LAB — RENAL FUNCTION PANEL
Albumin: 3 g/dL — ABNORMAL LOW (ref 3.5–5.0)
Anion gap: 11 (ref 5–15)
BUN: 55 mg/dL — ABNORMAL HIGH (ref 8–23)
CO2: 30 mmol/L (ref 22–32)
Calcium: 7.6 mg/dL — ABNORMAL LOW (ref 8.9–10.3)
Chloride: 102 mmol/L (ref 98–111)
Creatinine, Ser: 2.74 mg/dL — ABNORMAL HIGH (ref 0.44–1.00)
GFR calc Af Amer: 19 mL/min — ABNORMAL LOW (ref >60)
GFR calc non Af Amer: 16 mL/min — ABNORMAL LOW (ref >60)
Glucose, Bld: 107 mg/dL — ABNORMAL HIGH (ref 70–99)
Phosphorus: 6.1 mg/dL — ABNORMAL HIGH (ref 2.5–4.6)
Potassium: 3.9 mmol/L (ref 3.5–5.1)
Sodium: 143 mmol/L (ref 135–145)

## 2019-06-05 LAB — GLUCOSE, CAPILLARY
Glucose-Capillary: 82 mg/dL (ref 70–99)
Glucose-Capillary: 105 mg/dL — ABNORMAL HIGH (ref 70–99)
Glucose-Capillary: 131 mg/dL — ABNORMAL HIGH (ref 70–99)

## 2019-06-05 NOTE — Progress Notes (Signed)
Subjective: She feels somewhat improved compared to admission.  She is now on oral steroids.  She remains edematous. She remains on intravenous Lasix.   Objective: Vital signs in last 24 hours: Temp:  [97.9 F (36.6 C)-98.2 F (36.8 C)] 98 F (36.7 C) (10/31 0750) Pulse Rate:  [79-94] 80 (10/31 0750) Resp:  [7-16] 16 (10/30 2334) BP: (102-137)/(35-94) 117/45 (10/31 0700) SpO2:  [91 %-100 %] 92 % (10/31 0809) She is obese.  Lung fields anteriorly appear to be clear.  She does have significant peripheral pitting edema in her lower legs.  She is alert and orientated. Intake/Output from previous day: 10/30 0701 - 10/31 0700 In: 100 [IV Piggyback:100] Out: -  Intake/Output this shift: No intake/output data recorded.  Recent Labs    06/04/19 0501  HGB 10.0*   Recent Labs    06/04/19 0501  WBC 4.7  RBC 3.72*  HCT 34.0*  PLT 203   Recent Labs    06/04/19 0501 06/05/19 0446  NA 138  143 143  K 4.5  4.6 3.9  CL 100  104 102  CO2 27  26 30   BUN 47*  47* 55*  CREATININE 2.36*  2.36* 2.74*  GLUCOSE 172*  168* 107*  CALCIUM 7.7*  7.9* 7.6*   No results for input(s): LABPT, INR in the last 72 hours.    Assessment/Plan: 1.  Acute on chronic diastolic congestive heart failure, clinically somewhat improved.  Continue with IV Lasix. 2.Acute on chronic hypoxic and hypercapnic respiratory failure on BiPAP.  Continue with oral steroids. 3.  Monitor kidney function as creatinine seems to be worsening. 4.  We will follow tomorrow.   Nimish C Gosrani 06/05/2019, 8:18 AM

## 2019-06-05 NOTE — Progress Notes (Signed)
Subjective: She is on BiPAP.  She says she feels okay.  No new complaints.  I agree entirely that noninvasive ventilator at home is indicated.  She has had respiratory acidemia despite BiPAP in the hospital.  Objective: Vital signs in last 24 hours: Temp:  [97.9 F (36.6 C)-98.2 F (36.8 C)] 98 F (36.7 C) (10/31 0750) Pulse Rate:  [79-94] 80 (10/31 0750) Resp:  [7-16] 16 (10/30 2334) BP: (102-137)/(35-94) 117/45 (10/31 0700) SpO2:  [91 %-100 %] 92 % (10/31 0809) Weight change:  Last BM Date: 06/04/19  Intake/Output from previous day: 10/30 0701 - 10/31 0700 In: 100 [IV Piggyback:100] Out: -   PHYSICAL EXAM General appearance: On BiPAP.  Awake enough to talk to me.  No complaints. Resp: rhonchi bilaterally Cardio: regular rate and rhythm, S1, S2 normal, no murmur, click, rub or gallop GI: soft, non-tender; bowel sounds normal; no masses,  no organomegaly Extremities: extremities normal, atraumatic, no cyanosis or edema  Lab Results:  Results for orders placed or performed during the hospital encounter of 05/31/19 (from the past 48 hour(s))  Potassium     Status: None   Collection Time: 06/03/19 10:10 AM  Result Value Ref Range   Potassium 5.1 3.5 - 5.1 mmol/L    Comment: Performed at [facility], [address], [place], [place] [address]  Glucose, capillary     Status: None   Collection Time: 06/03/19 12:00 PM  Result Value Ref Range   Glucose-Capillary 88 70 - 99 mg/dL  Potassium     Status: None   Collection Time: 06/03/19  2:16 PM  Result Value Ref Range   Potassium 5.1 3.5 - 5.1 mmol/L    Comment: Performed at [facility], [address], [place], [place] [address]  Glucose, capillary     Status: Abnormal   Collection Time: 06/03/19  5:11 PM  Result Value Ref Range   Glucose-Capillary 102 (H) 70 - 99 mg/dL  Potassium     Status: Abnormal   Collection Time: 06/03/19  6:07 PM  Result Value Ref Range   Potassium 5.3 (H) 3.5 - 5.1 mmol/L    Comment: Performed at  [facility], [address], [place], [place] [address]  Glucose, capillary     Status: Abnormal   Collection Time: 06/03/19  9:21 PM  Result Value Ref Range   Glucose-Capillary 142 (H) 70 - 99 mg/dL   Comment 1 Notify RN    Comment 2 Document in Chart   Blood gas, arterial     Status: Abnormal   Collection Time: 06/04/19  5:00 AM  Result Value Ref Range   FIO2 40.00    pH, Arterial 7.300 (L) 7.350 - 7.450   pCO2 arterial 62.5 (H) 32.0 - 48.0 mmHg   pO2, Arterial 82.3 (L) 83.0 - 108.0 mmHg   Bicarbonate 27.0 20.0 - 28.0 mmol/L   Acid-Base Excess 3.9 (H) 0.0 - 2.0 mmol/L   O2 Saturation 95.5 %   Patient temperature 37.0    Allens test (pass/fail) PASS PASS    Comment: Performed at [facility], [address], [place], [place] [address]  Basic metabolic panel     Status: Abnormal   Collection Time: 06/04/19  5:01 AM  Result Value Ref Range   Sodium 143 135 - 145 mmol/L   Potassium 4.6 3.5 - 5.1 mmol/L   Chloride 104 98 - 111 mmol/L   CO2 26 22 - 32 mmol/L   Glucose, Bld 168 (H) 70 - 99 mg/dL  BUN 47 (H) 8 - 23 mg/dL   Creatinine, Ser 2.36 (H) 0.44 - 1.00 mg/dL   Calcium 7.9 (L) 8.9 - 10.3 mg/dL   GFR calc non Af Amer 20 (L) >60 mL/min   GFR calc Af Amer 23 (L) >60 mL/min   Anion gap 13 5 - 15    Comment: Performed at [facility], [address] [place], [place] [address]  CBC     Status: Abnormal   Collection Time: 06/04/19  5:01 AM  Result Value Ref Range   WBC 4.7 4.0 - 10.5 K/uL   RBC 3.72 (L) 3.87 - 5.11 MIL/uL   Hemoglobin 10.0 (L) 12.0 - 15.0 g/dL   HCT 34.0 (L) 36.0 - 46.0 %   MCV 91.4 80.0 - 100.0 fL   MCH 26.9 26.0 - 34.0 pg   MCHC 29.4 (L) 30.0 - 36.0 g/dL   RDW 14.3 11.5 - 15.5 %   Platelets 203 150 - 400 K/uL   nRBC 0.0 0.0 - 0.2 %    Comment: Performed at [facility], [address], [place], [place] [address]  Renal function panel     Status: Abnormal   Collection Time: 06/04/19  5:01 AM  Result Value Ref Range   Sodium 138 135 - 145 mmol/L    Potassium 4.5 3.5 - 5.1 mmol/L   Chloride 100 98 - 111 mmol/L   CO2 27 22 - 32 mmol/L   Glucose, Bld 172 (H) 70 - 99 mg/dL   BUN 47 (H) 8 - 23 mg/dL   Creatinine, Ser 2.36 (H) 0.44 - 1.00 mg/dL   Calcium 7.7 (L) 8.9 - 10.3 mg/dL   Phosphorus 6.5 (H) 2.5 - 4.6 mg/dL   Albumin 3.0 (L) 3.5 - 5.0 g/dL   GFR calc non Af Amer 20 (L) >60 mL/min   GFR calc Af Amer 23 (L) >60 mL/min   Anion gap 11 5 - 15    Comment: Performed at [facility], [address], [place], [place] [address]  Glucose, capillary     Status: Abnormal   Collection Time: 06/04/19  7:45 AM  Result Value Ref Range   Glucose-Capillary 170 (H) 70 - 99 mg/dL  Glucose, capillary     Status: Abnormal   Collection Time: 06/04/19 11:39 AM  Result Value Ref Range   Glucose-Capillary 166 (H) 70 - 99 mg/dL  Glucose, capillary     Status: Abnormal   Collection Time: 06/04/19  4:47 PM  Result Value Ref Range   Glucose-Capillary 152 (H) 70 - 99 mg/dL  Glucose, capillary     Status: Abnormal   Collection Time: 06/04/19  9:37 PM  Result Value Ref Range   Glucose-Capillary 144 (H) 70 - 99 mg/dL   Comment 1 Notify RN    Comment 2 Document in Chart   Renal function panel     Status: Abnormal   Collection Time: 06/05/19  4:46 AM  Result Value Ref Range   Sodium 143 135 - 145 mmol/L   Potassium 3.9 3.5 - 5.1 mmol/L   Chloride 102 98 - 111 mmol/L   CO2 30 22 - 32 mmol/L   Glucose, Bld 107 (H) 70 - 99 mg/dL   BUN 55 (H) 8 - 23 mg/dL   Creatinine, Ser 2.74 (H) 0.44 - 1.00 mg/dL   Calcium 7.6 (L) 8.9 - 10.3 mg/dL   Phosphorus 6.1 (H) 2.5 - 4.6 mg/dL   Albumin 3.0 (L) 3.5 - 5.0 g/dL   GFR calc non  Af Amer 16 (L) >60 mL/min   GFR calc Af Amer 19 (L) >60 mL/min   Anion gap 11 5 - 15    Comment: Performed at [facility], [address], [place], [place] [address]  Glucose, capillary     Status: None   Collection Time: 06/05/19  7:50 AM  Result Value Ref Range   Glucose-Capillary 82 70 - 99 mg/dL    ABGS Recent Labs     06/04/19 0500  PHART 7.300*  PO2ART 82.3*  HCO3 27.0   CULTURES Recent Results (from the past 240 hour(s))  SARS CORONAVIRUS 2 (TAT 6-24 HRS) Nasopharyngeal Nasopharyngeal Swab     Status: None   Collection Time: 05/31/19 10:27 PM   Specimen: Nasopharyngeal Swab  Result Value Ref Range Status   SARS Coronavirus 2 NEGATIVE NEGATIVE Final    Comment: (NOTE) SARS-CoV-2 target nucleic acids are NOT DETECTED. The SARS-CoV-2 RNA is generally detectable in upper and lower respiratory specimens during the acute phase of infection. Negative results do not preclude SARS-CoV-2 infection, do not rule out co-infections with other pathogens, and should not be used as the sole basis for treatment or other patient management decisions. Negative results must be combined with clinical observations, patient history, and epidemiological information. The expected result is Negative. Fact Sheet for Patients: SugarRoll.be Fact Sheet for Healthcare Providers: https://www.woods-mathews.com/ This test is not yet approved or cleared by the [place] FDA and  has been authorized for detection and/or diagnosis of SARS-CoV-2 by FDA under an Emergency Use Authorization (EUA). This EUA will remain  in effect (meaning this test can be used) for the duration of the COVID-19 declaration under Section 56 4(b)(1) of the Act, 21 U.S.C. section 360bbb-3(b)(1), unless the authorization is terminated or revoked sooner. Performed at [facility], [place] [address], [place], [place] [address]   MRSA PCR Screening     Status: None   Collection Time: 06/01/19 11:51 AM   Specimen: Nasopharyngeal  Result Value Ref Range Status   MRSA by PCR NEGATIVE NEGATIVE Final    Comment:        The GeneXpert MRSA Assay (FDA approved for NASAL specimens only), is one component of a comprehensive MRSA colonization surveillance program. It is not intended to diagnose MRSA infection  nor to guide or monitor treatment for MRSA infections. Performed at [facility], [address] [place], [place] [address]    Studies/Results: No results found.  Medications:  Prior to Admission:  Medications Prior to Admission  Medication Sig Dispense Refill Last Dose  . albuterol (PROVENTIL) (2.5 MG/3ML) 0.083% nebulizer solution Take 2.5 mg by nebulization every 6 (six) hours as needed for wheezing or shortness of breath.    Past Month at Unknown time  . albuterol (VENTOLIN HFA) 108 (90 Base) MCG/ACT inhaler Inhale 2 puffs into the lungs every 6 (six) hours as needed for wheezing or shortness of breath.    05/30/2019 at Unknown time  . allopurinol (ZYLOPRIM) 100 MG tablet Take 1 tablet by mouth 2 (two) times daily.   05/31/2019 at Unknown time  . amitriptyline (ELAVIL) 100 MG tablet Take 100 mg by mouth at bedtime.     05/30/2019 at Unknown time  . amLODipine (NORVASC) 5 MG tablet Take 1 tablet (5 mg total) by mouth daily. 90 tablet 3 05/31/2019 at Unknown time  . aspirin EC 81 MG tablet Take 81 mg by mouth daily.   05/31/2019 at Unknown time  . atorvastatin (LIPITOR) 40 MG tablet Take  1 tablet (40 mg total) by mouth at bedtime. 90 tablet 1 05/30/2019 at Unknown time  . BIOTIN PO Take 1 capsule by mouth daily.   05/31/2019 at Unknown time  . esomeprazole (NEXIUM) 40 MG capsule TAKE 1 CAPSULE TWICE A DAY 30 MINUTES PRIOR TO MEALS (Patient taking differently: Take 40 mg by mouth every morning. *May take one additional capsule if needed) 180 capsule 1 05/31/2019 at Unknown time  . famotidine (PEPCID) 40 MG tablet TAKE 1 TABLET AT BEDTIME AS NEEDED TO CONTROL REFLUX (Patient taking differently: Take 40 mg by mouth at bedtime as needed for heartburn or indigestion. ) 90 tablet 1 Past Week at Unknown time  . furosemide (LASIX) 20 MG tablet Take 2 tablets (40 mg total) by mouth daily. Take 80 mg alternating with 60 mg every other day (Patient taking differently: Take 40 mg by mouth daily. ) 30  tablet 2 05/31/2019 at Unknown time  . hydrALAZINE (APRESOLINE) 25 MG tablet Take 1 tablet (25 mg total) by mouth 2 (two) times daily. 60 tablet 6 05/31/2019 at Unknown time  . insulin lispro (HUMALOG) 100 UNIT/ML KiwkPen Inject 14-16 Units into the skin See admin instructions.    05/31/2019 at Unknown time  . LANTUS SOLOSTAR 100 UNIT/ML SOPN Inject 16 Units into the skin at bedtime.    05/30/2019 at Unknown time  . loperamide (IMODIUM) 2 MG capsule Take 2 mg by mouth as needed for diarrhea or loose stools.   unknown  . meclizine (ANTIVERT) 25 MG tablet Take 1 tablet (25 mg total) by mouth 3 (three) times daily as needed for dizziness. 30 tablet 0 Past Month at Unknown time  . metoprolol succinate (TOPROL-XL) 50 MG 24 hr tablet Take 75 mg by mouth daily. Take with or immediately following a meal. (Patient taking differently: Take 75 mg by mouth every morning. ) 135 tablet 1 05/31/2019 at 900  . spironolactone (ALDACTONE) 50 MG tablet Take 50 mg by mouth daily.   05/31/2019 at Unknown time  . TRELEGY ELLIPTA 100-62.5-25 MCG/INH AEPB Inhale 1 puff into the lungs daily.   05/30/2019 at Unknown time   Scheduled: . albuterol  2.5 mg Nebulization Q6H  . allopurinol  100 mg Oral BID  . amitriptyline  100 mg Oral QHS  . aspirin EC  81 mg Oral Daily  . atorvastatin  40 mg Oral q1800  . chlorhexidine  15 mL Mouth Rinse BID  . Chlorhexidine Gluconate Cloth  6 each Topical Daily  . doxycycline  100 mg Oral BID  . fluticasone furoate-vilanterol  1 puff Inhalation Daily   And  . umeclidinium bromide  1 puff Inhalation Daily  . furosemide  40 mg Intravenous Q12H  . guaiFENesin  600 mg Oral BID  . heparin  5,000 Units Subcutaneous Q8H  . hydrALAZINE  25 mg Oral BID  . insulin aspart  0-9 Units Subcutaneous TID WC  . insulin glargine  12 Units Subcutaneous QHS  . mouth rinse  15 mL Mouth Rinse q12n4p  . metoprolol succinate  75 mg Oral q morning - 10a  . pantoprazole  40 mg Oral Daily  . predniSONE  50  mg Oral Q breakfast  . sodium chloride flush  3 mL Intravenous Q12H   Continuous: . sodium chloride    . cefTRIAXone (ROCEPHIN)  IV 1 g (06/04/19 0857)   RKY:HCWCBJ chloride, acetaminophen, albuterol, ondansetron (ZOFRAN) IV, sodium chloride flush  Assesment: She was admitted with acute on chronic hypoxic and hypercapnic respiratory failure.  She has continue with respiratory acidemia despite BiPAP.  She has been able to go to nasal cannula during the day.  She has COPD at baseline and that increases her issues with her breathing  She has obesity with obesity hypoventilation and sleep apnea and CPAP is not adequate to treat her condition Principal Problem:   Acute on chronic respiratory failure with hypoxia and hypercapnia with respiratory acidosis Active Problems:   Obstructive chronic bronchitis without exacerbation (HCC)   OSA on CPAP   Acute on chronic diastolic CHF (congestive heart failure) (HCC)   CKD (chronic kidney disease) stage 3, GFR 30-59 ml/min   Type 2 diabetes with nephropathy (HCC)   Chronic respiratory failure with hypoxia (HCC)   Dyspnea and respiratory abnormalities    Plan: Continue current treatment    LOS: 4 days   Alonza Bogus 06/05/2019, 8:21 AM

## 2019-06-06 LAB — GLUCOSE, CAPILLARY
Glucose-Capillary: 90 mg/dL (ref 70–99)
Glucose-Capillary: 105 mg/dL — ABNORMAL HIGH (ref 70–99)
Glucose-Capillary: 145 mg/dL — ABNORMAL HIGH (ref 70–99)
Glucose-Capillary: 151 mg/dL — ABNORMAL HIGH (ref 70–99)

## 2019-06-06 NOTE — Progress Notes (Signed)
Subjective: She has no new complaints today.  She has been doing reasonably well on BiPAP.   Objective: Vital signs in last 24 hours: Temp:  [97.2 F (36.2 C)-98.2 F (36.8 C)] 97.6 F (36.4 C) (11/01 0810) Pulse Rate:  [78-90] 81 (11/01 0800) Resp:  [16] 16 (10/31 2315) BP: (94-135)/(44-93) 130/63 (11/01 0400) SpO2:  [90 %-96 %] 95 % (11/01 0803) Weight:  [287 kg] 111 kg (11/01 0500) Obese, not in significant respiratory distress.  Lung fields anteriorly are clear, I was not able to get her to sit up to listen to her lung fields posteriorly.  Heart sounds present without murmurs or added sounds.  She does appear to be alert and orientated.  She has chronic lower leg edema. Intake/Output from previous day: 10/31 0701 - 11/01 0700 In: 57 [P.O.:720; IV Piggyback:100] Out: -  Intake/Output this shift: No intake/output data recorded.  Recent Labs    06/04/19 0501  HGB 10.0*   Recent Labs    06/04/19 0501  WBC 4.7  RBC 3.72*  HCT 34.0*  PLT 203   Recent Labs    06/04/19 0501 06/05/19 0446  NA 138  143 143  K 4.5  4.6 3.9  CL 100  104 102  CO2 27  26 30   BUN 47*  47* 55*  CREATININE 2.36*  2.36* 2.74*  GLUCOSE 172*  168* 107*  CALCIUM 7.7*  7.9* 7.6*     Assessment/Plan: 1.  Acute on chronic diastolic congestive heart failure, stable.  Continue with IV Lasix for the time being.  We will need to monitor renal function closely as there has been somewhat of a deterioration yesterday. 2.  Acute on chronic hypoxic hypercapnic respiratory failure, continue with BiPAP and oral steroids.   Nimish C Gosrani 06/06/2019, 8:29 AM

## 2019-06-06 NOTE — Progress Notes (Signed)
Subjective: She is on BiPAP and doing well.  She motions no complaints.  She did well off BiPAP yesterday on nasal oxygen.  Objective: Vital signs in last 24 hours: Temp:  [97.2 F (36.2 C)-98.2 F (36.8 C)] 97.2 F (36.2 C) (11/01 0500) Pulse Rate:  [78-90] 78 (11/01 0700) Resp:  [16] 16 (10/31 2315) BP: (94-135)/(41-93) 130/63 (11/01 0400) SpO2:  [90 %-95 %] 93 % (11/01 0700) Weight:  [601 kg] 111 kg (11/01 0500) Weight change:  Last BM Date: 06/04/19  Intake/Output from previous day: 10/31 0701 - 11/01 0700 In: 820 [P.O.:720; IV Piggyback:100] Out: -   PHYSICAL EXAM General appearance: morbidly obese and On BiPAP Resp: rhonchi bilaterally Cardio: regular rate and rhythm, S1, S2 normal, no murmur, click, rub or gallop GI: soft, non-tender; bowel sounds normal; no masses,  no organomegaly Extremities: Chronic changes in her legs  Lab Results:  Results for orders placed or performed during the hospital encounter of 05/31/19 (from the past 48 hour(s))  Glucose, capillary     Status: Abnormal   Collection Time: 06/04/19 11:39 AM  Result Value Ref Range   Glucose-Capillary 166 (H) 70 - 99 mg/dL  Glucose, capillary     Status: Abnormal   Collection Time: 06/04/19  4:47 PM  Result Value Ref Range   Glucose-Capillary 152 (H) 70 - 99 mg/dL  Glucose, capillary     Status: Abnormal   Collection Time: 06/04/19  9:37 PM  Result Value Ref Range   Glucose-Capillary 144 (H) 70 - 99 mg/dL   Comment 1 Notify RN    Comment 2 Document in Chart   Renal function panel     Status: Abnormal   Collection Time: 06/05/19  4:46 AM  Result Value Ref Range   Sodium 143 135 - 145 mmol/L   Potassium 3.9 3.5 - 5.1 mmol/L   Chloride 102 98 - 111 mmol/L   CO2 30 22 - 32 mmol/L   Glucose, Bld 107 (H) 70 - 99 mg/dL   BUN 55 (H) 8 - 23 mg/dL   Creatinine, Ser 2.74 (H) 0.44 - 1.00 mg/dL   Calcium 7.6 (L) 8.9 - 10.3 mg/dL   Phosphorus 6.1 (H) 2.5 - 4.6 mg/dL   Albumin 3.0 (L) 3.5 - 5.0 g/dL    GFR calc non Af Amer 16 (L) >60 mL/min   GFR calc Af Amer 19 (L) >60 mL/min   Anion gap 11 5 - 15    Comment: Performed at [facility], [address], [place], [place] [address]  Glucose, capillary     Status: None   Collection Time: 06/05/19  7:50 AM  Result Value Ref Range   Glucose-Capillary 82 70 - 99 mg/dL  Glucose, capillary     Status: Abnormal   Collection Time: 06/05/19 11:22 AM  Result Value Ref Range   Glucose-Capillary 105 (H) 70 - 99 mg/dL  Glucose, capillary     Status: Abnormal   Collection Time: 06/05/19  9:04 PM  Result Value Ref Range   Glucose-Capillary 131 (H) 70 - 99 mg/dL    ABGS Recent Labs    06/04/19 0500  PHART 7.300*  PO2ART 82.3*  HCO3 27.0   CULTURES Recent Results (from the past 240 hour(s))  SARS CORONAVIRUS 2 (TAT 6-24 HRS) Nasopharyngeal Nasopharyngeal Swab     Status: None   Collection Time: 05/31/19 10:27 PM   Specimen: Nasopharyngeal Swab  Result Value Ref Range Status   SARS Coronavirus 2 NEGATIVE NEGATIVE Final  Comment: (NOTE) SARS-CoV-2 target nucleic acids are NOT DETECTED. The SARS-CoV-2 RNA is generally detectable in upper and lower respiratory specimens during the acute phase of infection. Negative results do not preclude SARS-CoV-2 infection, do not rule out co-infections with other pathogens, and should not be used as the sole basis for treatment or other patient management decisions. Negative results must be combined with clinical observations, patient history, and epidemiological information. The expected result is Negative. Fact Sheet for Patients: SugarRoll.be Fact Sheet for Healthcare Providers: https://www.woods-mathews.com/ This test is not yet approved or cleared by the [place] FDA and  has been authorized for detection and/or diagnosis of SARS-CoV-2 by FDA under an Emergency Use Authorization (EUA). This EUA will remain  in effect (meaning this test can be used)  for the duration of the COVID-19 declaration under Section 56 4(b)(1) of the Act, 21 U.S.C. section 360bbb-3(b)(1), unless the authorization is terminated or revoked sooner. Performed at [facility], [place] [address] [place], [place] [address]   MRSA PCR Screening     Status: None   Collection Time: 06/01/19 11:51 AM   Specimen: Nasopharyngeal  Result Value Ref Range Status   MRSA by PCR NEGATIVE NEGATIVE Final    Comment:        The GeneXpert MRSA Assay (FDA approved for NASAL specimens only), is one component of a comprehensive MRSA colonization surveillance program. It is not intended to diagnose MRSA infection nor to guide or monitor treatment for MRSA infections. Performed at [facility], [address], [place], [place] [address]    Studies/Results: No results found.  Medications:  Prior to Admission:  Medications Prior to Admission  Medication Sig Dispense Refill Last Dose  . albuterol (PROVENTIL) (2.5 MG/3ML) 0.083% nebulizer solution Take 2.5 mg by nebulization every 6 (six) hours as needed for wheezing or shortness of breath.    Past Month at Unknown time  . albuterol (VENTOLIN HFA) 108 (90 Base) MCG/ACT inhaler Inhale 2 puffs into the lungs every 6 (six) hours as needed for wheezing or shortness of breath.    05/30/2019 at Unknown time  . allopurinol (ZYLOPRIM) 100 MG tablet Take 1 tablet by mouth 2 (two) times daily.   05/31/2019 at Unknown time  . amitriptyline (ELAVIL) 100 MG tablet Take 100 mg by mouth at bedtime.     05/30/2019 at Unknown time  . amLODipine (NORVASC) 5 MG tablet Take 1 tablet (5 mg total) by mouth daily. 90 tablet 3 05/31/2019 at Unknown time  . aspirin EC 81 MG tablet Take 81 mg by mouth daily.   05/31/2019 at Unknown time  . atorvastatin (LIPITOR) 40 MG tablet Take 1 tablet (40 mg total) by mouth at bedtime. 90 tablet 1 05/30/2019 at Unknown time  . BIOTIN PO Take 1 capsule by mouth daily.   05/31/2019 at Unknown time  . esomeprazole  (NEXIUM) 40 MG capsule TAKE 1 CAPSULE TWICE A DAY 30 MINUTES PRIOR TO MEALS (Patient taking differently: Take 40 mg by mouth every morning. *May take one additional capsule if needed) 180 capsule 1 05/31/2019 at Unknown time  . famotidine (PEPCID) 40 MG tablet TAKE 1 TABLET AT BEDTIME AS NEEDED TO CONTROL REFLUX (Patient taking differently: Take 40 mg by mouth at bedtime as needed for heartburn or indigestion. ) 90 tablet 1 Past Week at Unknown time  . furosemide (LASIX) 20 MG tablet Take 2 tablets (40 mg total) by mouth daily. Take 80 mg alternating with 60 mg every other day (Patient taking  differently: Take 40 mg by mouth daily. ) 30 tablet 2 05/31/2019 at Unknown time  . hydrALAZINE (APRESOLINE) 25 MG tablet Take 1 tablet (25 mg total) by mouth 2 (two) times daily. 60 tablet 6 05/31/2019 at Unknown time  . insulin lispro (HUMALOG) 100 UNIT/ML KiwkPen Inject 14-16 Units into the skin See admin instructions.    05/31/2019 at Unknown time  . LANTUS SOLOSTAR 100 UNIT/ML SOPN Inject 16 Units into the skin at bedtime.    05/30/2019 at Unknown time  . loperamide (IMODIUM) 2 MG capsule Take 2 mg by mouth as needed for diarrhea or loose stools.   unknown  . meclizine (ANTIVERT) 25 MG tablet Take 1 tablet (25 mg total) by mouth 3 (three) times daily as needed for dizziness. 30 tablet 0 Past Month at Unknown time  . metoprolol succinate (TOPROL-XL) 50 MG 24 hr tablet Take 75 mg by mouth daily. Take with or immediately following a meal. (Patient taking differently: Take 75 mg by mouth every morning. ) 135 tablet 1 05/31/2019 at 900  . spironolactone (ALDACTONE) 50 MG tablet Take 50 mg by mouth daily.   05/31/2019 at Unknown time  . TRELEGY ELLIPTA 100-62.5-25 MCG/INH AEPB Inhale 1 puff into the lungs daily.   05/30/2019 at Unknown time   Scheduled: . albuterol  2.5 mg Nebulization Q6H  . allopurinol  100 mg Oral BID  . amitriptyline  100 mg Oral QHS  . aspirin EC  81 mg Oral Daily  . atorvastatin  40 mg  Oral q1800  . chlorhexidine  15 mL Mouth Rinse BID  . Chlorhexidine Gluconate Cloth  6 each Topical Daily  . doxycycline  100 mg Oral BID  . fluticasone furoate-vilanterol  1 puff Inhalation Daily   And  . umeclidinium bromide  1 puff Inhalation Daily  . furosemide  40 mg Intravenous Q12H  . guaiFENesin  600 mg Oral BID  . heparin  5,000 Units Subcutaneous Q8H  . hydrALAZINE  25 mg Oral BID  . insulin aspart  0-9 Units Subcutaneous TID WC  . insulin glargine  12 Units Subcutaneous QHS  . mouth rinse  15 mL Mouth Rinse q12n4p  . metoprolol succinate  75 mg Oral q morning - 10a  . pantoprazole  40 mg Oral Daily  . predniSONE  50 mg Oral Q breakfast  . sodium chloride flush  3 mL Intravenous Q12H   Continuous: . sodium chloride 250 mL (06/06/19 0554)  . cefTRIAXone (ROCEPHIN)  IV 1 g (06/05/19 1001)   OXB:DZHGDJ chloride, acetaminophen, albuterol, ondansetron (ZOFRAN) IV, sodium chloride flush  Assesment: She has acute on chronic hypoxic and hypercapnic respiratory failure.  This is multifactorial and includes COPD which is mostly chronic bronchitis, obesity hypoventilation, obstructive sleep apnea and acute on chronic diastolic heart failure.  She is improving but I agree that she is not going to do well if she does not have noninvasive ventilator at home. Principal Problem:   Acute on chronic respiratory failure with hypoxia and hypercapnia with respiratory acidosis Active Problems:   Obstructive chronic bronchitis without exacerbation (HCC)   OSA on CPAP   Acute on chronic diastolic CHF (congestive heart failure) (HCC)   CKD (chronic kidney disease) stage 3, GFR 30-59 ml/min   Type 2 diabetes with nephropathy (HCC)   Chronic respiratory failure with hypoxia (HCC)   Dyspnea and respiratory abnormalities    Plan: Continue current treatments.    LOS: 5 days   [name] 06/06/2019, 7:59 AM

## 2019-06-07 DIAGNOSIS — G4733 Obstructive sleep apnea (adult) (pediatric): Secondary | ICD-10-CM

## 2019-06-07 DIAGNOSIS — Z9989 Dependence on other enabling machines and devices: Secondary | ICD-10-CM

## 2019-06-07 LAB — BASIC METABOLIC PANEL
Anion gap: 15 (ref 5–15)
BUN: 74 mg/dL — ABNORMAL HIGH (ref 8–23)
CO2: 28 mmol/L (ref 22–32)
Calcium: 7.7 mg/dL — ABNORMAL LOW (ref 8.9–10.3)
Chloride: 100 mmol/L (ref 98–111)
Creatinine, Ser: 2.64 mg/dL — ABNORMAL HIGH (ref 0.44–1.00)
GFR calc Af Amer: 20 mL/min — ABNORMAL LOW (ref >60)
GFR calc non Af Amer: 17 mL/min — ABNORMAL LOW (ref >60)
Glucose, Bld: 119 mg/dL — ABNORMAL HIGH (ref 70–99)
Potassium: 3.9 mmol/L (ref 3.5–5.1)
Sodium: 143 mmol/L (ref 135–145)

## 2019-06-07 LAB — GLUCOSE, CAPILLARY
Glucose-Capillary: 159 mg/dL — ABNORMAL HIGH (ref 70–99)
Glucose-Capillary: 103 mg/dL — ABNORMAL HIGH (ref 70–99)
Glucose-Capillary: 131 mg/dL — ABNORMAL HIGH (ref 70–99)
Glucose-Capillary: 195 mg/dL — ABNORMAL HIGH (ref 70–99)
Glucose-Capillary: 226 mg/dL — ABNORMAL HIGH (ref 70–99)

## 2019-06-07 LAB — BLOOD GAS, ARTERIAL
Acid-Base Excess: 5.6 mmol/L — ABNORMAL HIGH (ref 0.0–2.0)
Bicarbonate: 28.2 mmol/L — ABNORMAL HIGH (ref 20.0–28.0)
FIO2: 28
O2 Saturation: 95 %
Patient temperature: 36.6
pCO2 arterial: 65.6 mmHg (ref 32.0–48.0)
pH, Arterial: 7.304 — ABNORMAL LOW (ref 7.350–7.450)
pO2, Arterial: 82 mmHg — ABNORMAL LOW (ref 83.0–108.0)

## 2019-06-07 MED ORDER — METOLAZONE 5 MG PO TABS
2.5000 mg | ORAL_TABLET | ORAL | Status: DC
  Administered 2019-06-07: 2.5 mg via ORAL
  Filled 2019-06-07: qty 1

## 2019-06-07 MED ORDER — TORSEMIDE 20 MG PO TABS
30.0000 mg | ORAL_TABLET | Freq: Two times a day (BID) | ORAL | Status: DC
  Administered 2019-06-08: 30 mg via ORAL
  Filled 2019-06-07: qty 2

## 2019-06-07 NOTE — Progress Notes (Signed)
CRITICAL VALUE ALERT  Critical Value:  PCO2 65.6  Date & Time Notied: 06/07/2019 @ 2241  Provider Notified: Dr. Dyann Kief  Orders Received/Actions taken: No new orders currently

## 2019-06-07 NOTE — TOC Progression Note (Signed)
Transition of Care [facility]) - Progression Note    Patient Details  Name: [name] MRN: [id number] Date of Birth: [birth year]  [facility]) CM/SW Contact  Keylen Uzelac, [name], RN Phone Number: 06/07/2019, 1:34 PM  Clinical Narrative:   Discussed patient progression in rounds, patient will need Bipap/NIV from home. NIV statement in chart from previous attending. Discussed with Juliann Pulse of [employer] who will follow up. Patient will need another ABG for qualifications.     Expected Discharge Plan: [place] Barriers to Discharge: Continued Medical Work up  Expected Discharge Plan and Services Expected Discharge Plan: [place] arrangements for the past 2 months: Single Family Home                 DME Arranged: Nebulizer machine DME Agency: [employer] Date DME Agency Contacted: 06/02/19 Time DME Agency Contacted: [phone] Representative spoke with at DME Agency: Juliann Pulse [place]             Social Determinants of Health ([place]) Interventions    Readmission Risk Interventions No flowsheet data found.

## 2019-06-07 NOTE — Progress Notes (Signed)
Subjective: She says she feels better today.  She is off BiPAP this morning but did wear it last night.  She wants to know why she is in the hospital and I explained to her that she does have acute on chronic diastolic heart failure and hypoxic and hypercapnic respiratory failure and she has been significantly short of breath and hypoxic.  Objective: Vital signs in last 24 hours: Temp:  [97.6 F (36.4 C)-98.5 F (36.9 C)] 97.9 F (36.6 C) (11/02 0400) Pulse Rate:  [79-88] 79 (11/02 0400) Resp:  [16] 16 (11/01 2324) BP: (109-145)/(40-51) 128/45 (11/02 0400) SpO2:  [92 %-98 %] 96 % (11/02 0400) Weight:  [111.9 kg] 111.9 kg (11/02 0500) Weight change: 0.9 kg Last BM Date: 06/04/19  Intake/Output from previous day: 11/01 0701 - 11/02 0700 In: 620 [P.O.:480; I.V.:40; IV Piggyback:100] Out: 300 [Urine:300]  PHYSICAL EXAM General appearance: alert, cooperative and no distress Resp: rhonchi bilaterally Cardio: regular rate and rhythm, S1, S2 normal, no murmur, click, rub or gallop GI: soft, non-tender; bowel sounds normal; no masses,  no organomegaly Extremities: She still has edema and chronic venous stasis changes  Lab Results:  Results for orders placed or performed during the hospital encounter of 05/31/19 (from the past 48 hour(s))  Glucose, capillary     Status: Abnormal   Collection Time: 06/05/19 11:22 AM  Result Value Ref Range   Glucose-Capillary 105 (H) 70 - 99 mg/dL  Glucose, capillary     Status: Abnormal   Collection Time: 06/05/19  9:04 PM  Result Value Ref Range   Glucose-Capillary 131 (H) 70 - 99 mg/dL  Glucose, capillary     Status: None   Collection Time: 06/06/19  8:11 AM  Result Value Ref Range   Glucose-Capillary 90 70 - 99 mg/dL  Glucose, capillary     Status: Abnormal   Collection Time: 06/06/19 11:46 AM  Result Value Ref Range   Glucose-Capillary 105 (H) 70 - 99 mg/dL  Glucose, capillary     Status: Abnormal   Collection Time: 06/06/19  5:02 PM   Result Value Ref Range   Glucose-Capillary 145 (H) 70 - 99 mg/dL  Glucose, capillary     Status: Abnormal   Collection Time: 06/06/19  9:33 PM  Result Value Ref Range   Glucose-Capillary 151 (H) 70 - 99 mg/dL  Basic metabolic panel     Status: Abnormal   Collection Time: 06/07/19  4:07 AM  Result Value Ref Range   Sodium 143 135 - 145 mmol/L   Potassium 3.9 3.5 - 5.1 mmol/L   Chloride 100 98 - 111 mmol/L   CO2 28 22 - 32 mmol/L   Glucose, Bld 119 (H) 70 - 99 mg/dL   BUN 74 (H) 8 - 23 mg/dL   Creatinine, Ser 2.64 (H) 0.44 - 1.00 mg/dL   Calcium 7.7 (L) 8.9 - 10.3 mg/dL   GFR calc non Af Amer 17 (L) >60 mL/min   GFR calc Af Amer 20 (L) >60 mL/min   Anion gap 15 5 - 15    Comment: Performed at [facility], [address], [place], [place] [address]  Glucose, capillary     Status: Abnormal   Collection Time: 06/07/19  7:34 AM  Result Value Ref Range   Glucose-Capillary 103 (H) 70 - 99 mg/dL    ABGS No results for input(s): PHART, PO2ART, TCO2, HCO3 in the last 72 hours.  Invalid input(s): PCO2 CULTURES Recent Results (from the past 240 hour(s))  SARS CORONAVIRUS  2 (TAT 6-24 HRS) Nasopharyngeal Nasopharyngeal Swab     Status: None   Collection Time: 05/31/19 10:27 PM   Specimen: Nasopharyngeal Swab  Result Value Ref Range Status   SARS Coronavirus 2 NEGATIVE NEGATIVE Final    Comment: (NOTE) SARS-CoV-2 target nucleic acids are NOT DETECTED. The SARS-CoV-2 RNA is generally detectable in upper and lower respiratory specimens during the acute phase of infection. Negative results do not preclude SARS-CoV-2 infection, do not rule out co-infections with other pathogens, and should not be used as the sole basis for treatment or other patient management decisions. Negative results must be combined with clinical observations, patient history, and epidemiological information. The expected result is Negative. Fact Sheet for Patients: SugarRoll.be Fact  Sheet for Healthcare Providers: https://www.woods-mathews.com/ This test is not yet approved or cleared by the [place] FDA and  has been authorized for detection and/or diagnosis of SARS-CoV-2 by FDA under an Emergency Use Authorization (EUA). This EUA will remain  in effect (meaning this test can be used) for the duration of the COVID-19 declaration under Section 56 4(b)(1) of the Act, 21 U.S.C. section 360bbb-3(b)(1), unless the authorization is terminated or revoked sooner. Performed at [facility], [place] [address], [place], [place] [address]   MRSA PCR Screening     Status: None   Collection Time: 06/01/19 11:51 AM   Specimen: Nasopharyngeal  Result Value Ref Range Status   MRSA by PCR NEGATIVE NEGATIVE Final    Comment:        The GeneXpert MRSA Assay (FDA approved for NASAL specimens only), is one component of a comprehensive MRSA colonization surveillance program. It is not intended to diagnose MRSA infection nor to guide or monitor treatment for MRSA infections. Performed at [facility], [address], [place], [place] [address]    Studies/Results: No results found.  Medications:  Prior to Admission:  Medications Prior to Admission  Medication Sig Dispense Refill Last Dose  . albuterol (PROVENTIL) (2.5 MG/3ML) 0.083% nebulizer solution Take 2.5 mg by nebulization every 6 (six) hours as needed for wheezing or shortness of breath.    Past Month at Unknown time  . albuterol (VENTOLIN HFA) 108 (90 Base) MCG/ACT inhaler Inhale 2 puffs into the lungs every 6 (six) hours as needed for wheezing or shortness of breath.    05/30/2019 at Unknown time  . allopurinol (ZYLOPRIM) 100 MG tablet Take 1 tablet by mouth 2 (two) times daily.   05/31/2019 at Unknown time  . amitriptyline (ELAVIL) 100 MG tablet Take 100 mg by mouth at bedtime.     05/30/2019 at Unknown time  . amLODipine (NORVASC) 5 MG tablet Take 1 tablet (5 mg total) by mouth daily. 90 tablet 3  05/31/2019 at Unknown time  . aspirin EC 81 MG tablet Take 81 mg by mouth daily.   05/31/2019 at Unknown time  . atorvastatin (LIPITOR) 40 MG tablet Take 1 tablet (40 mg total) by mouth at bedtime. 90 tablet 1 05/30/2019 at Unknown time  . BIOTIN PO Take 1 capsule by mouth daily.   05/31/2019 at Unknown time  . esomeprazole (NEXIUM) 40 MG capsule TAKE 1 CAPSULE TWICE A DAY 30 MINUTES PRIOR TO MEALS (Patient taking differently: Take 40 mg by mouth every morning. *May take one additional capsule if needed) 180 capsule 1 05/31/2019 at Unknown time  . famotidine (PEPCID) 40 MG tablet TAKE 1 TABLET AT BEDTIME AS NEEDED TO CONTROL REFLUX (Patient taking differently: Take 40 mg by mouth at bedtime as  needed for heartburn or indigestion. ) 90 tablet 1 Past Week at Unknown time  . furosemide (LASIX) 20 MG tablet Take 2 tablets (40 mg total) by mouth daily. Take 80 mg alternating with 60 mg every other day (Patient taking differently: Take 40 mg by mouth daily. ) 30 tablet 2 05/31/2019 at Unknown time  . hydrALAZINE (APRESOLINE) 25 MG tablet Take 1 tablet (25 mg total) by mouth 2 (two) times daily. 60 tablet 6 05/31/2019 at Unknown time  . insulin lispro (HUMALOG) 100 UNIT/ML KiwkPen Inject 14-16 Units into the skin See admin instructions.    05/31/2019 at Unknown time  . LANTUS SOLOSTAR 100 UNIT/ML SOPN Inject 16 Units into the skin at bedtime.    05/30/2019 at Unknown time  . loperamide (IMODIUM) 2 MG capsule Take 2 mg by mouth as needed for diarrhea or loose stools.   unknown  . meclizine (ANTIVERT) 25 MG tablet Take 1 tablet (25 mg total) by mouth 3 (three) times daily as needed for dizziness. 30 tablet 0 Past Month at Unknown time  . metoprolol succinate (TOPROL-XL) 50 MG 24 hr tablet Take 75 mg by mouth daily. Take with or immediately following a meal. (Patient taking differently: Take 75 mg by mouth every morning. ) 135 tablet 1 05/31/2019 at 900  . spironolactone (ALDACTONE) 50 MG tablet Take 50 mg by  mouth daily.   05/31/2019 at Unknown time  . TRELEGY ELLIPTA 100-62.5-25 MCG/INH AEPB Inhale 1 puff into the lungs daily.   05/30/2019 at Unknown time   Scheduled: . albuterol  2.5 mg Nebulization Q6H  . allopurinol  100 mg Oral BID  . amitriptyline  100 mg Oral QHS  . aspirin EC  81 mg Oral Daily  . atorvastatin  40 mg Oral q1800  . chlorhexidine  15 mL Mouth Rinse BID  . Chlorhexidine Gluconate Cloth  6 each Topical Daily  . doxycycline  100 mg Oral BID  . fluticasone furoate-vilanterol  1 puff Inhalation Daily   And  . umeclidinium bromide  1 puff Inhalation Daily  . furosemide  40 mg Intravenous Q12H  . guaiFENesin  600 mg Oral BID  . heparin  5,000 Units Subcutaneous Q8H  . hydrALAZINE  25 mg Oral BID  . insulin aspart  0-9 Units Subcutaneous TID WC  . insulin glargine  12 Units Subcutaneous QHS  . mouth rinse  15 mL Mouth Rinse q12n4p  . metolazone  2.5 mg Oral Q M,W,F  . metoprolol succinate  75 mg Oral q morning - 10a  . pantoprazole  40 mg Oral Daily  . predniSONE  50 mg Oral Q breakfast  . sodium chloride flush  3 mL Intravenous Q12H  . [START ON 06/08/2019] torsemide  30 mg Oral BID   Continuous: . sodium chloride 250 mL (06/06/19 0554)   TKP:[id number] chloride, acetaminophen, albuterol, ondansetron (ZOFRAN) IV, sodium chloride flush  Assesment: She has acute on chronic hypoxic and hypercapnic respiratory failure and has been requiring BiPAP.  She is better this morning.  She has acute on chronic diastolic heart failure with anasarca  She has obstructive sleep apnea at baseline and is on CPAP  She has chronic kidney disease which is stage III Principal Problem:   Acute on chronic respiratory failure with hypoxia and hypercapnia with respiratory acidosis Active Problems:   Obstructive chronic bronchitis without exacerbation (HCC)   OSA on CPAP   Acute on chronic diastolic CHF (congestive heart failure) (HCC)   CKD (chronic kidney disease) stage  3, GFR 30-59  ml/min   Type 2 diabetes with nephropathy (HCC)   Chronic respiratory failure with hypoxia (HCC)   Dyspnea and respiratory abnormalities    Plan: Continue current treatments.    LOS: 6 days   Alonza Bogus 06/07/2019, 7:51 AM

## 2019-06-07 NOTE — Care Management Important Message (Signed)
Important Message  Patient Details  Name: [name] MRN: [id number] Date of Birth: 04/28/1946   Medicare Important Message Given:  Yes     Tommy Medal 06/07/2019, 1:21 PM

## 2019-06-07 NOTE — Progress Notes (Signed)
Patient Demographics:    [name], is a 73 y.o. female, DOB - [birth year], DDU:[id number]  Admit date - 05/31/2019   Admitting Physician Courage [name], MD  Outpatient Primary MD for the patient is [name], MD  LOS - 6   Chief Complaint  Patient presents with   Abdominal Pain        Subjective:    [name] no fever, no chest pain, no nausea, no vomiting.  Patient with good tolerance of oxygen supplementation through nasal cannula during the day and continue use of BiPAP at nighttime.  ABGs demonstrating irritability.  No acute distress.   Assessment  & Plan :    Principal Problem:   Acute on chronic respiratory failure with hypoxia and hypercapnia with respiratory acidosis Active Problems:   Obstructive chronic bronchitis without exacerbation (HCC)   OSA on CPAP   Acute on chronic diastolic CHF (congestive heart failure) (HCC)   CKD (chronic kidney disease) stage 3, GFR 30-59 ml/min   Type 2 diabetes with nephropathy (HCC)   Chronic respiratory failure with hypoxia (HCC)   Dyspnea and respiratory abnormalities  Brief Summary 73 y.o. female with medical history significant for coronary artery disease, hypertension, type 2 diabetes mellitus, COPD with chronic hypoxic respiratory failure, chronic kidney disease stage III, and chronic diastolic CHF admitted on 23/76/2831 with worsening volume overload and found to have uncompensated respiratory acidosis/hypercapnic respiratory failure  A/p 1)Acute on chronic hypoxic and hypercapnic respiratory failure--- -Used BiPAP overnight, --Repeat ABG with pH of 7.30 and PCO2 of 63 -Patient will need BiPAP or NIV equivalent as needed during the day and nightly given persistent uncompensated respiratory acidosis with lethargy -Avoid high flow oxygen -Continue bronchodilators and diuretics -Pulmonology consult from Dr. Luan Pulling  appreciated -Persistent hypercapnia with uncompensated respiratory acidosis despite BiPAP use  in the hospital consider NIV/Trilogy upon discharge home -NIV/Trilogy Requirement Statement:- Patient has severe/end-stage COPD with chronic hypoxic and hypercapnic respiratory failure.  Despite aggressive treatment with BiPAP here in the hospital, patient continues to exhibit signs of significant hypercapnia associated with chronic respiratory failure secondary to severe/end-stage COPD with uncompensated respiratory acidosis.  Patient requires the use of NIV both nightly and daytime to help with exacerbation periods.  The use of the NIV will treat patient's high PCO2 levels (please see ABG results on Bipap), and use of NIV can reduce risk of exacerbation in future hospitalizations when used at night and during the day.  Patient will need these advanced settings in conjunction with the current medication regimen and aggressive pulmonary treatment: BiPAP is not an option due to her functional limitations and the severity of the patient's condition.   Failure to have an IV available for use  could lead to death.   patient had significant episodes of severe lethargy and unresponsiveness due to very very high CO2 levels despite BiPAP use here in the hospital  2) heart failure with preserved ejection fraction; patient presented with acute on chronic diastolic dysfunction -Last known EF 60 to 65% -No crackles on examination; will transition diuretics to oral regimen and assess urine output. -Fluid balance negative  -Weight has trended down appropriately. -Discussion about low calorie diet, daily weights and adequate hydration sustained with patient.  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 -Pulmonology consult from Dr. Luan Pulling appreciated  5)H/o CAD  -stable, no chest pains, continue aspirin, Lipitor and Toprol-XL 25 mg daily  6)DM2-recent A1c 7.4,--reflecting fair control,  Change Lantus to 12 units units qhs (was on 16 units PTA)-- - Use Novolog/Humalog Sliding scale insulin with Accu-Cheks / Fingersticks as ordered -Anticipate improvement in glycemic control with steroid taper  7)Gastritis and Large Hiatal Hernia--stable,  -continue Protonix 40 mg daily  8)HTN-stable, Soft BP, improved after medication changes continue to hold  Amlodipine, decrease Toprol-XL to 25 from  75 mg daily, hydralazine 25 mg twice daily,  9) morbid obesity/OSA-Persistent hypercapnia with uncompensated respiratory acidosis despite BiPAP use  in the hospital. -Patient will benefit of NIV/Trilogy upon discharge home -This device will be needed for nighttime usage and also prolonged napping. -Will definitely impaired patient decrease risk for future hospitalization, condition exacerbation and death. -Please check ABG for demonstrated stability while using equitable noninvasive ventilatory support at night and in the hospital.  10) chronic anemia--- hemoglobin currently around 10 which is close to patient's baseline -No evidence of overt bleeding  11)Social/Ethics--- Discussed with Son-  Mr. [name]  [phone]--- -Pt is a Full code w/o limitations to treatment at this time  Disposition/Need for in-Hospital Stay-transition diuretic regimen to overall route and assess urine output and renal function. -Patient is a great candidate for NIV/trilogy machine upon discharge  home  Code Status : Full  Family Communication:    No family at bedside.  Disposition Plan  : Anticipate discharge home in the next 24-48 hours with home health services and the use of noninvasive ventilatory device at nighttime and for napping.  Consults  :  Na  DVT Prophylaxis  :   - Heparin   Lab Results  Component Value Date   PLT 203 06/04/2019    Inpatient Medications  Scheduled Meds:  albuterol  2.5 mg Nebulization Q6H   allopurinol  100 mg Oral BID   amitriptyline  100 mg Oral QHS   aspirin EC  81 mg Oral Daily   atorvastatin  40 mg Oral q1800   chlorhexidine  15 mL Mouth Rinse BID   Chlorhexidine Gluconate Cloth  6 each Topical Daily   doxycycline  100 mg Oral BID   fluticasone furoate-vilanterol  1 puff Inhalation Daily   And   umeclidinium bromide  1 puff Inhalation Daily   furosemide  40 mg Intravenous Q12H   guaiFENesin  600 mg Oral BID   heparin  5,000 Units Subcutaneous Q8H   hydrALAZINE  25 mg Oral BID   insulin aspart  0-9 Units Subcutaneous TID WC   insulin glargine  12 Units Subcutaneous QHS   mouth rinse  15 mL Mouth Rinse q12n4p   metolazone  2.5 mg Oral Q M,W,F   metoprolol succinate  75 mg Oral q morning - 10a   pantoprazole  40 mg Oral Daily   predniSONE  50 mg Oral Q breakfast   sodium chloride flush  3 mL Intravenous Q12H   [  START ON 06/08/2019] torsemide  30 mg Oral BID   Continuous Infusions:  sodium chloride 250 mL (06/06/19 0554)   PRN Meds:.sodium chloride, acetaminophen, albuterol, ondansetron (ZOFRAN) IV, sodium chloride flush    Anti-infectives (From admission, onward)   Start     Dose/Rate Route Frequency Ordered Stop   06/03/19 1530  doxycycline (VIBRA-TABS) tablet 100 mg     100 mg Oral 2 times daily 06/03/19 1523     06/03/19 1000  cefTRIAXone (ROCEPHIN) 1 g in sodium chloride 0.9 % 100 mL IVPB  Status:  Discontinued     1 g 200 mL/hr over 30 Minutes Intravenous Every 24 hours 06/03/19 0902 06/07/19  0733        Objective:   Vitals:   06/07/19 0900 06/07/19 1000 06/07/19 1418 06/07/19 1626  BP:      Pulse: 84 84    Resp:      Temp:    98.1 F (36.7 C)  TempSrc:    Oral  SpO2: 97% 95% 96%   Weight:      Height:        Wt Readings from Last 3 Encounters:  06/07/19 111.9 kg  05/31/19 112.9 kg  03/16/19 89.8 kg     Intake/Output Summary (Last 24 hours) at 06/07/2019 1645 Last data filed at 06/07/2019 0900 Gross per 24 hour  Intake 462.39 ml  Output 300 ml  Net 162.39 ml     Physical Exam General exam: Alert, awake, oriented x 3; morbidly obese on exam; conversive T and in no acute distress.  Reports still feeling short of breath with activity but overall significantly improved.  Patient has been able to tolerate well the use of nasal cannula oxygen supplementation during the day and the use of noninvasive ventilatory treatment at nighttime. Respiratory system: Decreased breath sounds at the bases, positive rhonchi; no wheezing currently.  No using accessory muscles. Cardiovascular system: RRR. No rubs or gallops; right-sided Port-A-Cath is clean dry and intact. Gastrointestinal system: Abdomen is obese, soft and nontender. No organomegaly or masses felt. Normal bowel sounds heard. Central nervous system: Alert and oriented. No focal neurological deficits. Extremities: No cyanosis or clubbing; trace to 1+ edema lower limbs bilaterally. Skin: No rashes, lesions or ulcers Psychiatry: Judgement and insight appear normal. Mood & affect appropriate.     Data Review:   Micro Results Recent Results (from the past 240 hour(s))  SARS CORONAVIRUS 2 (TAT 6-24 HRS) Nasopharyngeal Nasopharyngeal Swab     Status: None   Collection Time: 05/31/19 10:27 PM   Specimen: Nasopharyngeal Swab  Result Value Ref Range Status   SARS Coronavirus 2 NEGATIVE NEGATIVE Final    Comment: (NOTE) SARS-CoV-2 target nucleic acids are NOT DETECTED. The SARS-CoV-2 RNA is generally detectable in  upper and lower respiratory specimens during the acute phase of infection. Negative results do not preclude SARS-CoV-2 infection, do not rule out co-infections with other pathogens, and should not be used as the sole basis for treatment or other patient management decisions. Negative results must be combined with clinical observations, patient history, and epidemiological information. The expected result is Negative. Fact Sheet for Patients: SugarRoll.be Fact Sheet for Healthcare Providers: https://www.woods-mathews.com/ This test is not yet approved or cleared by the [place] FDA and  has been authorized for detection and/or diagnosis of SARS-CoV-2 by FDA under an Emergency Use Authorization (EUA). This EUA will remain  in effect (meaning this test can be used) for the duration of the COVID-19 declaration under Section 56  4(b)(1) of the Act, 21 U.S.C. section 360bbb-3(b)(1), unless the authorization is terminated or revoked sooner. Performed at [facility], [place] [address] [place], [place] [address]   MRSA PCR Screening     Status: None   Collection Time: 06/01/19 11:51 AM   Specimen: Nasopharyngeal  Result Value Ref Range Status   MRSA by PCR NEGATIVE NEGATIVE Final    Comment:        The GeneXpert MRSA Assay (FDA approved for NASAL specimens only), is one component of a comprehensive MRSA colonization surveillance program. It is not intended to diagnose MRSA infection nor to guide or monitor treatment for MRSA infections. Performed at [facility], [address] [place], [place] [address]     Radiology Reports Ct Abdomen Pelvis Wo Contrast  Result Date: 05/31/2019 CLINICAL DATA:  Abdominal distension increasing over the past 3 weeks EXAM: CT ABDOMEN AND PELVIS WITHOUT CONTRAST TECHNIQUE: Multidetector CT imaging of the abdomen and pelvis was performed following the standard protocol without IV contrast. COMPARISON:   05/25/2019 FINDINGS: Lower chest: 5 mm nodule is noted in the medial aspect of the left lower lobe. A few smaller associated nodules are noted. Large hiatal hernia is seen with almost the entire stomach within the chest. Hepatobiliary: No focal liver abnormality is seen. Status post cholecystectomy. No biliary dilatation. Pancreas: Unremarkable. No pancreatic ductal dilatation or surrounding inflammatory changes. Spleen: Normal in size without focal abnormality. Adrenals/Urinary Tract: Adrenal glands are within normal limits. The kidneys demonstrate no definitive renal calculi or obstructive changes. The bladder is partially decompressed. Stomach/Bowel: Changes of diverticulosis are seen within the colon without evidence of diverticulitis. No obstructive or inflammatory changes are noted. The appendix is within normal limits. No small bowel abnormality is seen. The stomach again demonstrates a large hiatal hernia with wall thickening relatively stable from the prior exam. Vascular/Lymphatic: Aortic atherosclerosis. No enlarged abdominal or pelvic lymph nodes. Reproductive: Status post hysterectomy. No adnexal masses. Other: No abdominal wall hernia or abnormality. No abdominopelvic ascites. Considerable changes of anasarca are again identified within the abdominal wall stable from the prior study. Some laxity of the pelvic floor is noted and stable. Musculoskeletal: Postsurgical and degenerative changes are noted stable from the prior study. IMPRESSION: Large hiatal hernia stable from the prior exam. Some wall thickening in the stomach is noted suspicious for gastritis. No obstructive changes are noted. Chronic changes similar to that seen on the prior exam. Changes of anasarca stable from the previous study. Electronically Signed   By: Inez Catalina M.D.   On: 05/31/2019 20:34   Dg Chest Portable 1 View  Result Date: 05/31/2019 CLINICAL DATA:  Cough EXAM: PORTABLE CHEST 1 VIEW COMPARISON:  Radiographs  07/12/2018, CT chest 07/16/2006, same-day CT abdomen and pelvis FINDINGS: Irregularity along the right heart border likely corresponds to the large hiatal hernia seen on CT of the abdomen pelvis. Hazy areas of basilar atelectasis are present. Additional increased attenuation lung bases likely related to body habitus. No pneumothorax or visible effusion. A right IJ approach Port-A-Cath tip terminates in the mid SVC. There is cardiomegaly which is similar to slightly increased from prior exam though may be accentuated by the portable technique. Slightly lobular configuration of the heart could also reflect small pericardial effusion. The aorta is tortuous. No acute osseous or soft tissue abnormality. Degenerative changes are present in the imaged spine and shoulders. IMPRESSION: 1. Irregularity along the right heart border likely corresponds to the large hiatal hernia seen on CT of the  abdomen pelvis. 2. Bibasilar atelectasis. No other acute cardiopulmonary abnormality. Increased attenuation of the bases likely related to body habitus. 3. Slightly lobular configuration of the heart could reflect the small volume pericardial fluid seen on CT abdomen pelvis. Electronically Signed   By: Lovena Le M.D.   On: 05/31/2019 20:46     CBC Recent Labs  Lab 06/02/19 0415 06/04/19 0501  WBC 6.9 4.7  HGB 10.6* 10.0*  HCT 36.7 34.0*  PLT 234 203  MCV 92.7 91.4  MCH 26.8 26.9  MCHC 28.9* 29.4*  RDW 14.1 14.3    Chemistries  Recent Labs  Lab 06/02/19 0415  06/03/19 0705  06/03/19 1416 06/03/19 1807 06/04/19 0501 06/05/19 0446 06/07/19 0407  NA 142  --  142  --   --   --  138   143 143 143  K 6.3*   < > 5.2*   5.2*   < > 5.1 5.3* 4.5   4.6 3.9 3.9  CL 105  --  103  --   --   --  100   104 102 100  CO2 29  --  29  --   --   --  27   26 30 28   GLUCOSE 134*  --  92  --   --   --  172*   168* 107* 119*  BUN 31*  --  42*  --   --   --  47*   47* 55* 74*  CREATININE 1.80*  --  2.33*  --   --   --  2.36*    2.36* 2.74* 2.64*  CALCIUM 8.5*  --  8.4*  --   --   --  7.7*   7.9* 7.6* 7.7*   < > = values in this interval not displayed.     Lab Results  Component Value Date   HGBA1C 6.7 (H) 07/13/2018   ------------------------------------------------------------------------------------------------------------------    Component Value Date/Time   BNP 206.0 (H) 05/31/2019 1658     Barton Dubois M.D on 06/07/2019 at 4:45 PM  [phone]

## 2019-06-08 DIAGNOSIS — J441 Chronic obstructive pulmonary disease with (acute) exacerbation: Secondary | ICD-10-CM

## 2019-06-08 LAB — GLUCOSE, CAPILLARY
Glucose-Capillary: 118 mg/dL — ABNORMAL HIGH (ref 70–99)
Glucose-Capillary: 163 mg/dL — ABNORMAL HIGH (ref 70–99)

## 2019-06-08 MED ORDER — PREDNISONE 20 MG PO TABS: ORAL_TABLET | ORAL | 0 refills | Status: DC

## 2019-06-08 MED ORDER — METOLAZONE 2.5 MG PO TABS: 2.5000 mg | ORAL_TABLET | ORAL | 1 refills | Status: DC

## 2019-06-08 MED ORDER — ALBUTEROL SULFATE (2.5 MG/3ML) 0.083% IN NEBU
2.5000 mg | INHALATION_SOLUTION | Freq: Four times a day (QID) | RESPIRATORY_TRACT | Status: DC
  Administered 2019-06-08 (×2): 2.5 mg via RESPIRATORY_TRACT
  Filled 2019-06-08 (×2): qty 3

## 2019-06-08 MED ORDER — DOXYCYCLINE HYCLATE 100 MG PO TABS: 100.0000 mg | ORAL_TABLET | Freq: Two times a day (BID) | ORAL | 0 refills | Status: AC

## 2019-06-08 MED ORDER — TORSEMIDE 10 MG PO TABS: 30.0000 mg | ORAL_TABLET | Freq: Two times a day (BID) | ORAL | 2 refills | Status: DC

## 2019-06-08 MED ORDER — GUAIFENESIN ER 600 MG PO TB12: 600.0000 mg | ORAL_TABLET | Freq: Two times a day (BID) | ORAL | 0 refills | Status: DC

## 2019-06-08 MED ORDER — HEPARIN SOD (PORK) LOCK FLUSH 100 UNIT/ML IV SOLN
500.0000 [IU] | Freq: Once | INTRAVENOUS | Status: AC
  Administered 2019-06-08: 500 [IU] via INTRAVENOUS
  Filled 2019-06-08: qty 5

## 2019-06-08 NOTE — Progress Notes (Signed)
Subjective: She says she feels better.  She used BiPAP last night and she is on nasal cannula now.  She still has swelling.  She says her breathing is pretty good.  Objective: Vital signs in last 24 hours: Temp:  [97.7 F (36.5 C)-98.1 F (36.7 C)] 97.7 F (36.5 C) (11/03 0754) Pulse Rate:  [75-84] 76 (11/03 0754) BP: (122-138)/(47-113) 138/113 (11/03 0400) SpO2:  [94 %-100 %] 96 % (11/03 0754) Weight change:  Last BM Date: 06/04/19  Intake/Output from previous day: 11/02 0701 - 11/03 0700 In: 1107.2 [P.O.:960; I.V.:147.2] Out: 500 [Urine:500]  PHYSICAL EXAM General appearance: alert, cooperative and morbidly obese Resp: rhonchi bilaterally Cardio: regular rate and rhythm, S1, S2 normal, no murmur, click, rub or gallop GI: soft, non-tender; bowel sounds normal; no masses,  no organomegaly Extremities: She still has edema of both legs and chronic venous stasis changes and has some third spacing of fluid diffusely  Lab Results:  Results for orders placed or performed during the hospital encounter of 05/31/19 (from the past 48 hour(s))  Glucose, capillary     Status: None   Collection Time: 06/06/19  8:11 AM  Result Value Ref Range   Glucose-Capillary 90 70 - 99 mg/dL  Glucose, capillary     Status: Abnormal   Collection Time: 06/06/19 11:46 AM  Result Value Ref Range   Glucose-Capillary 105 (H) 70 - 99 mg/dL  Glucose, capillary     Status: Abnormal   Collection Time: 06/06/19  5:02 PM  Result Value Ref Range   Glucose-Capillary 145 (H) 70 - 99 mg/dL  Glucose, capillary     Status: Abnormal   Collection Time: 06/06/19  9:33 PM  Result Value Ref Range   Glucose-Capillary 151 (H) 70 - 99 mg/dL  Basic metabolic panel     Status: Abnormal   Collection Time: 06/07/19  4:07 AM  Result Value Ref Range   Sodium 143 135 - 145 mmol/L   Potassium 3.9 3.5 - 5.1 mmol/L   Chloride 100 98 - 111 mmol/L   CO2 28 22 - 32 mmol/L   Glucose, Bld 119 (H) 70 - 99 mg/dL   BUN 74 (H) 8 - 23  mg/dL   Creatinine, Ser 2.64 (H) 0.44 - 1.00 mg/dL   Calcium 7.7 (L) 8.9 - 10.3 mg/dL   GFR calc non Af Amer 17 (L) >60 mL/min   GFR calc Af Amer 20 (L) >60 mL/min   Anion gap 15 5 - 15    Comment: Performed at [facility], [address], [place], [place] [address]  Glucose, capillary     Status: Abnormal   Collection Time: 06/07/19  7:34 AM  Result Value Ref Range   Glucose-Capillary 103 (H) 70 - 99 mg/dL  Glucose, capillary     Status: Abnormal   Collection Time: 06/07/19 11:36 AM  Result Value Ref Range   Glucose-Capillary 131 (H) 70 - 99 mg/dL  Blood gas, arterial     Status: Abnormal   Collection Time: 06/07/19  2:15 PM  Result Value Ref Range   FIO2 28.00    pH, Arterial 7.304 (L) 7.350 - 7.450   pCO2 arterial 65.6 (HH) 32.0 - 48.0 mmHg    Comment: CRITICAL RESULT CALLED TO, READ BACK BY AND VERIFIED WITH: SMITH,J AT 1439 ON 11.2.20 BY ISLEY,B    pO2, Arterial 82.0 (L) 83.0 - 108.0 mmHg   Bicarbonate 28.2 (H) 20.0 - 28.0 mmol/L   Acid-Base Excess 5.6 (H) 0.0 - 2.0 mmol/L  O2 Saturation 95.0 %   Patient temperature 36.6    Allens test (pass/fail) PASS PASS    Comment: Performed at [facility], [address] [place], [place] [address]  Glucose, capillary     Status: Abnormal   Collection Time: 06/07/19  4:28 PM  Result Value Ref Range   Glucose-Capillary 195 (H) 70 - 99 mg/dL  Glucose, capillary     Status: Abnormal   Collection Time: 06/07/19  9:42 PM  Result Value Ref Range   Glucose-Capillary 226 (H) 70 - 99 mg/dL   Comment 1 Notify RN    Comment 2 Document in Chart   Glucose, capillary     Status: Abnormal   Collection Time: 06/08/19  7:52 AM  Result Value Ref Range   Glucose-Capillary 118 (H) 70 - 99 mg/dL    ABGS Recent Labs    06/07/19 1415  PHART 7.304*  PO2ART 82.0*  HCO3 28.2*   CULTURES Recent Results (from the past 240 hour(s))  SARS CORONAVIRUS 2 (TAT 6-24 HRS) Nasopharyngeal Nasopharyngeal Swab     Status: None   Collection Time:  05/31/19 10:27 PM   Specimen: Nasopharyngeal Swab  Result Value Ref Range Status   SARS Coronavirus 2 NEGATIVE NEGATIVE Final    Comment: (NOTE) SARS-CoV-2 target nucleic acids are NOT DETECTED. The SARS-CoV-2 RNA is generally detectable in upper and lower respiratory specimens during the acute phase of infection. Negative results do not preclude SARS-CoV-2 infection, do not rule out co-infections with other pathogens, and should not be used as the sole basis for treatment or other patient management decisions. Negative results must be combined with clinical observations, patient history, and epidemiological information. The expected result is Negative. Fact Sheet for Patients: SugarRoll.be Fact Sheet for Healthcare Providers: https://www.woods-mathews.com/ This test is not yet approved or cleared by the [place] FDA and  has been authorized for detection and/or diagnosis of SARS-CoV-2 by FDA under an Emergency Use Authorization (EUA). This EUA will remain  in effect (meaning this test can be used) for the duration of the COVID-19 declaration under Section 56 4(b)(1) of the Act, 21 U.S.C. section 360bbb-3(b)(1), unless the authorization is terminated or revoked sooner. Performed at [facility], [place] [address] [place], [place] [address]   MRSA PCR Screening     Status: None   Collection Time: 06/01/19 11:51 AM   Specimen: Nasopharyngeal  Result Value Ref Range Status   MRSA by PCR NEGATIVE NEGATIVE Final    Comment:        The GeneXpert MRSA Assay (FDA approved for NASAL specimens only), is one component of a comprehensive MRSA colonization surveillance program. It is not intended to diagnose MRSA infection nor to guide or monitor treatment for MRSA infections. Performed at [facility], [address] [place], [place] [address]    Studies/Results: No results found.  Medications:  Prior to Admission:  Medications  Prior to Admission  Medication Sig Dispense Refill Last Dose  . albuterol (PROVENTIL) (2.5 MG/3ML) 0.083% nebulizer solution Take 2.5 mg by nebulization every 6 (six) hours as needed for wheezing or shortness of breath.    Past Month at Unknown time  . albuterol (VENTOLIN HFA) 108 (90 Base) MCG/ACT inhaler Inhale 2 puffs into the lungs every 6 (six) hours as needed for wheezing or shortness of breath.    05/30/2019 at Unknown time  . allopurinol (ZYLOPRIM) 100 MG tablet Take 1 tablet by mouth 2 (two) times daily.   05/31/2019 at Unknown time  .  amitriptyline (ELAVIL) 100 MG tablet Take 100 mg by mouth at bedtime.     05/30/2019 at Unknown time  . amLODipine (NORVASC) 5 MG tablet Take 1 tablet (5 mg total) by mouth daily. 90 tablet 3 05/31/2019 at Unknown time  . aspirin EC 81 MG tablet Take 81 mg by mouth daily.   05/31/2019 at Unknown time  . atorvastatin (LIPITOR) 40 MG tablet Take 1 tablet (40 mg total) by mouth at bedtime. 90 tablet 1 05/30/2019 at Unknown time  . BIOTIN PO Take 1 capsule by mouth daily.   05/31/2019 at Unknown time  . esomeprazole (NEXIUM) 40 MG capsule TAKE 1 CAPSULE TWICE A DAY 30 MINUTES PRIOR TO MEALS (Patient taking differently: Take 40 mg by mouth every morning. *May take one additional capsule if needed) 180 capsule 1 05/31/2019 at Unknown time  . famotidine (PEPCID) 40 MG tablet TAKE 1 TABLET AT BEDTIME AS NEEDED TO CONTROL REFLUX (Patient taking differently: Take 40 mg by mouth at bedtime as needed for heartburn or indigestion. ) 90 tablet 1 Past Week at Unknown time  . furosemide (LASIX) 20 MG tablet Take 2 tablets (40 mg total) by mouth daily. Take 80 mg alternating with 60 mg every other day (Patient taking differently: Take 40 mg by mouth daily. ) 30 tablet 2 05/31/2019 at Unknown time  . hydrALAZINE (APRESOLINE) 25 MG tablet Take 1 tablet (25 mg total) by mouth 2 (two) times daily. 60 tablet 6 05/31/2019 at Unknown time  . insulin lispro (HUMALOG) 100 UNIT/ML KiwkPen  Inject 14-16 Units into the skin See admin instructions.    05/31/2019 at Unknown time  . LANTUS SOLOSTAR 100 UNIT/ML SOPN Inject 16 Units into the skin at bedtime.    05/30/2019 at Unknown time  . loperamide (IMODIUM) 2 MG capsule Take 2 mg by mouth as needed for diarrhea or loose stools.   unknown  . meclizine (ANTIVERT) 25 MG tablet Take 1 tablet (25 mg total) by mouth 3 (three) times daily as needed for dizziness. 30 tablet 0 Past Month at Unknown time  . metoprolol succinate (TOPROL-XL) 50 MG 24 hr tablet Take 75 mg by mouth daily. Take with or immediately following a meal. (Patient taking differently: Take 75 mg by mouth every morning. ) 135 tablet 1 05/31/2019 at 900  . spironolactone (ALDACTONE) 50 MG tablet Take 50 mg by mouth daily.   05/31/2019 at Unknown time  . TRELEGY ELLIPTA 100-62.5-25 MCG/INH AEPB Inhale 1 puff into the lungs daily.   05/30/2019 at Unknown time   Scheduled: . albuterol  2.5 mg Nebulization Q6H WA  . allopurinol  100 mg Oral BID  . amitriptyline  100 mg Oral QHS  . aspirin EC  81 mg Oral Daily  . atorvastatin  40 mg Oral q1800  . chlorhexidine  15 mL Mouth Rinse BID  . Chlorhexidine Gluconate Cloth  6 each Topical Daily  . doxycycline  100 mg Oral BID  . fluticasone furoate-vilanterol  1 puff Inhalation Daily   And  . umeclidinium bromide  1 puff Inhalation Daily  . guaiFENesin  600 mg Oral BID  . heparin  5,000 Units Subcutaneous Q8H  . hydrALAZINE  25 mg Oral BID  . insulin aspart  0-9 Units Subcutaneous TID WC  . insulin glargine  12 Units Subcutaneous QHS  . mouth rinse  15 mL Mouth Rinse q12n4p  . metolazone  2.5 mg Oral Q M,W,F  . metoprolol succinate  75 mg Oral q morning - 10a  .  pantoprazole  40 mg Oral Daily  . predniSONE  50 mg Oral Q breakfast  . sodium chloride flush  3 mL Intravenous Q12H  . torsemide  30 mg Oral BID   Continuous: . sodium chloride 250 mL (06/07/19 2058)   FXO:VANVBT chloride, acetaminophen, albuterol, ondansetron  (ZOFRAN) IV, sodium chloride flush  Assesment: She was admitted with acute on chronic hypoxic and hypercapnic respiratory failure with respiratory acidosis.  As previously noted she would be an excellent candidate for noninvasive ventilator at home.  She is improving slowly.  She has obesity with obesity hypoventilation and obstructive sleep apnea and has been on CPAP but that is not adequate for her now.  She has acute on chronic diastolic heart failure with some anasarca and that is improving  She has COPD which is mostly chronic bronchitis and she is on treatment for that  She has chronic kidney disease stage III. Principal Problem:   Acute on chronic respiratory failure with hypoxia and hypercapnia with respiratory acidosis Active Problems:   Obstructive chronic bronchitis without exacerbation (HCC)   OSA on CPAP   Acute on chronic diastolic CHF (congestive heart failure) (HCC)   CKD (chronic kidney disease) stage 3, GFR 30-59 ml/min   Type 2 diabetes with nephropathy (HCC)   Chronic respiratory failure with hypoxia (HCC)   Dyspnea and respiratory abnormalities    Plan: Continue current treatments no changes in medications    LOS: 7 days   [name] 06/08/2019, 7:56 AM

## 2019-06-08 NOTE — Discharge Summary (Signed)
Physician Discharge Summary  [name]:[id number] DOB: [birth year] DOA: 05/31/2019  PCP: [name], MD  Admit date: 05/31/2019 Discharge date: 06/08/2019  Time spent: 35 minutes  Recommendations for Outpatient Follow-up:  1. Repeat basic metabolic panel to follow electrolytes and renal function 2. Reassess patient blood pressure adjust antihypertensive regimen as needed.   Discharge Diagnoses:  Principal Problem:   Acute on chronic respiratory failure with hypoxia and hypercapnia with respiratory acidosis Active Problems:   Obstructive chronic bronchitis without exacerbation (HCC)   OSA on CPAP   Acute on chronic diastolic CHF (congestive heart failure) (HCC)   CKD (chronic kidney disease) stage 3, GFR 30-59 ml/min   COPD with acute exacerbation (HCC)   Type 2 diabetes with nephropathy (HCC)   Obesity, Class III, BMI 40-49.9 (morbid obesity) ([place])   Chronic respiratory failure with hypoxia (HCC)   Dyspnea and respiratory abnormalities   Discharge Condition: Stable and improved.  Patient discharged home with family care and the use of noninvasive ventilatory support every night.  Continue 2 L oxygen supplementation during the day and instructions to follow-up with PCP in 1 week.  Diet recommendation: Low calorie diet, modify carbohydrates and low sodium.  Filed Weights   06/04/19 0500 06/06/19 0500 06/07/19 0500  Weight: 109.8 kg 111 kg 111.9 kg    History of present illness:  As per H&P written by Dr. Myna Hidalgo on 05/31/2019. 73 y.o. female with medical history significant for coronary artery disease, hypertension, type 2 diabetes mellitus, COPD with chronic hypoxic respiratory failure, chronic kidney disease stage III, and chronic diastolic CHF, now presenting to the emergency department for evaluation of increased swelling involving her abdomen and bilateral legs, as well as exertional dyspnea.  She denies fevers, cough, or wheezing.  She denies chest  pain.  ED Course: Upon arrival to the ED, patient is found to be afebrile, saturating upper 80s on room air, slightly tachypneic, and with stable blood pressure.  EKG features a sinus rhythm and chest x-ray is notable for bibasilar atelectasis.  CT of the abdomen and pelvis demonstrates a large hiatal hernia, gastric wall thickening that could reflect gastritis, no obstructive changes, and stable appearing anasarca.  Chemistry panel with potassium of 5.6 and creatinine 1.64.  CBC unremarkable.  BNP is elevated to 206.  Patient was treated with 80 mg IV Lasix and 5 g Lokelma in the ED.  COVID-19 testing is in process.  Hospital Course:  1)Acute on chronic hypoxic and hypercapnic respiratory failure--- -Used BiPAP overnight, --Repeat ABG with pH of 7.30 and PCO2 of 63 -Patient will need BiPAP or NIV equivalent as needed during the day and nightly given persistent uncompensated respiratory acidosis with lethargy -Avoid high flow oxygen -Continue bronchodilators and diuretics -Pulmonology consult from Dr. Luan Pulling appreciated -Persistent hypercapnia with uncompensated respiratory acidosis despite BiPAP use  in the hospital consider NIV/Trilogy upon discharge home. -NIV/Trilogy Requirement Statement:- Patient has severe/end-stage COPD with chronic hypoxic and hypercapnic respiratory failure.  Despite aggressive treatment with BiPAP here in the hospital, patient continues to exhibit signs of significant hypercapnia associated with chronic respiratory failure secondary to severe/end-stage COPD with uncompensated respiratory acidosis.  Patient requires the use of NIV both nightly and daytime to help with exacerbation periods.  The use of the NIV will treat patient's high PCO2 levels (please see ABG results on Bipap), and use of NIV can reduce risk of exacerbation in future hospitalizations when used at night and during the day.  Patient will need these advanced settings  in conjunction with the current  medication regimen and aggressive pulmonary treatment: BiPAP is not an option due to her functional limitations and the severity of the patient's condition.   Failure to have NIV available for use  could lead to death.   patient had significant episodes of severe lethargy and unresponsiveness due to very very high CO2 levels.  2) heart failure with preserved ejection fraction; patient presented with acute on chronic diastolic dysfunction -Last known EF 60 to 65% -No crackles on examination; patient will be discharged on Demadex 20 mg twice a day along with metolazone 3 times a week. -Advised to follow low-sodium diet and daily weights -Patient instructed to maintain adequate hydration.  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 -Potassium down to 4.5 from 6.3, after 2 doses of Lokelma, AND  kayexalate -Continue to renally adjust medications, avoid nephrotoxic agents / dehydration / hypotension  4) COPD with chronic hypoxic respiratory failure--contributing to #1 above -Continue oxygen supplementation; goal is for O2 sat above 89-90%.  Requiring 2 L nasal cannula supplementation at discharge. -Continue oral steroids tapering , oral doxycycline (2 days pending at discharge),  -Continue the use of Mucinex, flutter valve, albuterol as a rescue inhaler and resumption of Trelegy Ellipta. -Pulmonology consult from Dr. Luan Pulling appreciated  5)H/o CAD  -stable, no chest pains, continue aspirin, Lipitor and Toprol-XL 25 mg daily  6)DM2-recent A1c 7.4, -reflecting fair control,  -Resume the use of insulin and Lantus. -Anticipate improvement in glycemic control with steroid taper  7)Gastritis and Large Hiatal Hernia--stable,  -continue Protonix, as needed Pepcid  8)HTN -stable and rising at  discharge -Resume home antihypertensive regiment -Follow vital signs at follow-up visit, chills regimen as required.  9) morbid obesity/OSA-Persistent hypercapnia with uncompensated respiratory acidosis despite BiPAP use  in the hospital. -Patient will benefit of NIV/Trilogy upon discharge home -This device will be needed for nighttime usage and also prolonged napping. -Will definitely impaired patient decrease risk for future hospitalization, condition exacerbation and death. -Imperative for patient to lose weight -Body mass index is 55.31 kg/m.  -Low calorie diet, portion control and increased physical activity discussed with patient.  10) chronic anemia--- hemoglobin currently around 10 which is close to patient's baseline -No evidence of overt bleeding  Procedures: Below for x-ray reports.  Consultations:  Pulmonology service  Discharge Exam: Vitals:   06/08/19 1300 06/08/19 1334  BP:    Pulse: 80   Resp:    Temp:    SpO2: 96% 94%   General exam: Alert, awake, oriented x 3; morbidly obese on exam; conversive  and in no acute distress.  Reports still feeling short of breath with activity but overall significantly improved and with a stable/controlled volume currently.. Patient has been able to tolerate well the use of nasal cannula oxygen supplementation during the day and the use of noninvasive ventilatory treatment at nighttime. Respiratory system: Decreased breath sounds at the bases, positive rhonchi; no wheezing currently.  No using accessory muscles. Cardiovascular system: RRR. No rubs or gallops; right-sided Port-A-Cath is clean dry and intact. Gastrointestinal system: Abdomen is obese, soft and nontender. No organomegaly or masses felt. Normal bowel sounds heard. Central nervous system: Alert and oriented. No focal neurological deficits. Extremities: No cyanosis or clubbing; 1+ edema lower limbs bilaterally. Skin: No rashes, lesions or ulcers Psychiatry: Judgement  and insight appear normal. Mood &  affect appropriate.    Discharge Instructions   Discharge Instructions    (HEART FAILURE PATIENTS) Call MD:  Anytime you have any of the following symptoms: 1) 3 pound weight gain in 24 hours or 5 pounds in 1 week 2) shortness of breath, with or without a dry hacking cough 3) swelling in the hands, feet or stomach 4) if you have to sleep on extra pillows at night in order to breathe.   Complete by: As directed    Diet - low sodium heart healthy   Complete by: As directed    Discharge instructions   Complete by: As directed    Take medications as prescribed Follow low-sodium diet and check your weight on daily basis Maintain adequate hydration Please use noninvasive ventilatory support every night and during long naps; continue the use of oxygen supplementation 2 L nasal cannula continuous throughout the day. Arrange follow-up with PCP in 10 days.   Increase activity slowly   Complete by: As directed      Allergies as of 06/08/2019   No Known Allergies     Medication List    STOP taking these medications   amLODipine 5 MG tablet Commonly known as: NORVASC   furosemide 20 MG tablet Commonly known as: LASIX   spironolactone 50 MG tablet Commonly known as: ALDACTONE     TAKE these medications   albuterol (2.5 MG/3ML) 0.083% nebulizer solution Commonly known as: PROVENTIL Take 2.5 mg by nebulization every 6 (six) hours as needed for wheezing or shortness of breath.   Ventolin HFA 108 (90 Base) MCG/ACT inhaler Generic drug: albuterol Inhale 2 puffs into the lungs every 6 (six) hours as needed for wheezing or shortness of breath.   allopurinol 100 MG tablet Commonly known as: ZYLOPRIM Take 1 tablet by mouth 2 (two) times daily.   amitriptyline 100 MG tablet Commonly known as: ELAVIL Take 100 mg by mouth at bedtime.   aspirin EC 81 MG tablet Take 81 mg by mouth daily.   atorvastatin 40 MG tablet Commonly known as: LIPITOR Take 1  tablet (40 mg total) by mouth at bedtime.   BIOTIN PO Take 1 capsule by mouth daily.   doxycycline 100 MG tablet Commonly known as: VIBRA-TABS Take 1 tablet (100 mg total) by mouth 2 (two) times daily for 2 days.   esomeprazole 40 MG capsule Commonly known as: NEXIUM TAKE 1 CAPSULE TWICE A DAY 30 MINUTES PRIOR TO MEALS What changed: See the new instructions.   famotidine 40 MG tablet Commonly known as: PEPCID TAKE 1 TABLET AT BEDTIME AS NEEDED TO CONTROL REFLUX What changed:   how much to take  how to take this  when to take this  reasons to take this  additional instructions   guaiFENesin 600 MG 12 hr tablet Commonly known as: MUCINEX Take 1 tablet (600 mg total) by mouth 2 (two) times daily.   hydrALAZINE 25 MG tablet Commonly known as: APRESOLINE Take 1 tablet (25 mg total) by mouth 2 (two) times daily.   insulin lispro 100 UNIT/ML KiwkPen Commonly known as: HUMALOG Inject 14-16 Units into the skin See admin instructions.   Lantus SoloStar 100 UNIT/ML Solostar Pen Generic drug: Insulin Glargine Inject 16 Units into the skin at bedtime.   loperamide 2 MG capsule Commonly known as: IMODIUM Take 2 mg by mouth as needed for diarrhea or loose stools.   meclizine 25 MG tablet Commonly known as: ANTIVERT Take 1 tablet (25 mg total) by mouth 3 (three)  times daily as needed for dizziness.   metolazone 2.5 MG tablet Commonly known as: ZAROXOLYN Take 1 tablet (2.5 mg total) by mouth every Monday, Wednesday, and Friday. Start taking on: June 09, 2019   metoprolol succinate 50 MG 24 hr tablet Commonly known as: TOPROL-XL Take 75 mg by mouth daily. Take with or immediately following a meal. What changed:   how much to take  how to take this  when to take this  additional instructions   predniSONE 20 MG tablet Commonly known as: DELTASONE Take 2 tablets by mouth daily x2 days; then 1 tablet by mouth daily x3 days; then half tab by mouth daily x3 days  and stop prednisone.   torsemide 10 MG tablet Commonly known as: DEMADEX Take 3 tablets (30 mg total) by mouth 2 (two) times daily.   Trelegy Ellipta 100-62.5-25 MCG/INH Aepb Generic drug: Fluticasone-Umeclidin-Vilant Inhale 1 puff into the lungs daily.            Durable Medical Equipment  (From admission, onward)         Start     Ordered   06/08/19 1033  For home use only DME Nebulizer machine  Once    Question Answer Comment  Patient needs a nebulizer to treat with the following condition Bronchitis   Length of Need Lifetime      06/08/19 1032         No Known Allergies Follow-up Information    [name], MD Follow up on 06/15/2019.   Specialty: Family Medicine Why: Please follow up with Dr. Lorra Hals on Tuesday, November 10th at 10:00am. Contact information: [address] [place] [address] [phone]           The results of significant diagnostics from this hospitalization (including imaging, microbiology, ancillary and laboratory) are listed below for reference.    Significant Diagnostic Studies: Ct Abdomen Pelvis Wo Contrast  Result Date: 05/31/2019 CLINICAL DATA:  Abdominal distension increasing over the past 3 weeks EXAM: CT ABDOMEN AND PELVIS WITHOUT CONTRAST TECHNIQUE: Multidetector CT imaging of the abdomen and pelvis was performed following the standard protocol without IV contrast. COMPARISON:  05/25/2019 FINDINGS: Lower chest: 5 mm nodule is noted in the medial aspect of the left lower lobe. A few smaller associated nodules are noted. Large hiatal hernia is seen with almost the entire stomach within the chest. Hepatobiliary: No focal liver abnormality is seen. Status post cholecystectomy. No biliary dilatation. Pancreas: Unremarkable. No pancreatic ductal dilatation or surrounding inflammatory changes. Spleen: Normal in size without focal abnormality. Adrenals/Urinary Tract: Adrenal glands are within normal limits. The kidneys  demonstrate no definitive renal calculi or obstructive changes. The bladder is partially decompressed. Stomach/Bowel: Changes of diverticulosis are seen within the colon without evidence of diverticulitis. No obstructive or inflammatory changes are noted. The appendix is within normal limits. No small bowel abnormality is seen. The stomach again demonstrates a large hiatal hernia with wall thickening relatively stable from the prior exam. Vascular/Lymphatic: Aortic atherosclerosis. No enlarged abdominal or pelvic lymph nodes. Reproductive: Status post hysterectomy. No adnexal masses. Other: No abdominal wall hernia or abnormality. No abdominopelvic ascites. Considerable changes of anasarca are again identified within the abdominal wall stable from the prior study. Some laxity of the pelvic floor is noted and stable. Musculoskeletal: Postsurgical and degenerative changes are noted stable from the prior study. IMPRESSION: Large hiatal hernia stable from the prior exam. Some wall thickening in the stomach is noted suspicious for gastritis. No obstructive changes  are noted. Chronic changes similar to that seen on the prior exam. Changes of anasarca stable from the previous study. Electronically Signed   By: Inez Catalina M.D.   On: 05/31/2019 20:34   Dg Chest Portable 1 View  Result Date: 05/31/2019 CLINICAL DATA:  Cough EXAM: PORTABLE CHEST 1 VIEW COMPARISON:  Radiographs 07/12/2018, CT chest 07/16/2006, same-day CT abdomen and pelvis FINDINGS: Irregularity along the right heart border likely corresponds to the large hiatal hernia seen on CT of the abdomen pelvis. Hazy areas of basilar atelectasis are present. Additional increased attenuation lung bases likely related to body habitus. No pneumothorax or visible effusion. A right IJ approach Port-A-Cath tip terminates in the mid SVC. There is cardiomegaly which is similar to slightly increased from prior exam though may be accentuated by the portable technique.  Slightly lobular configuration of the heart could also reflect small pericardial effusion. The aorta is tortuous. No acute osseous or soft tissue abnormality. Degenerative changes are present in the imaged spine and shoulders. IMPRESSION: 1. Irregularity along the right heart border likely corresponds to the large hiatal hernia seen on CT of the abdomen pelvis. 2. Bibasilar atelectasis. No other acute cardiopulmonary abnormality. Increased attenuation of the bases likely related to body habitus. 3. Slightly lobular configuration of the heart could reflect the small volume pericardial fluid seen on CT abdomen pelvis. Electronically Signed   By: Lovena Le M.D.   On: 05/31/2019 20:46    Microbiology: Recent Results (from the past 240 hour(s))  SARS CORONAVIRUS 2 (TAT 6-24 HRS) Nasopharyngeal Nasopharyngeal Swab     Status: None   Collection Time: 05/31/19 10:27 PM   Specimen: Nasopharyngeal Swab  Result Value Ref Range Status   SARS Coronavirus 2 NEGATIVE NEGATIVE Final    Comment: (NOTE) SARS-CoV-2 target nucleic acids are NOT DETECTED. The SARS-CoV-2 RNA is generally detectable in upper and lower respiratory specimens during the acute phase of infection. Negative results do not preclude SARS-CoV-2 infection, do not rule out co-infections with other pathogens, and should not be used as the sole basis for treatment or other patient management decisions. Negative results must be combined with clinical observations, patient history, and epidemiological information. The expected result is Negative. Fact Sheet for Patients: SugarRoll.be Fact Sheet for Healthcare Providers: https://www.woods-mathews.com/ This test is not yet approved or cleared by the [place] FDA and  has been authorized for detection and/or diagnosis of SARS-CoV-2 by FDA under an Emergency Use Authorization (EUA). This EUA will remain  in effect (meaning this test can be used) for  the duration of the COVID-19 declaration under Section 56 4(b)(1) of the Act, 21 U.S.C. section 360bbb-3(b)(1), unless the authorization is terminated or revoked sooner. Performed at [facility], [place] [address] [place], [place] [address]   MRSA PCR Screening     Status: None   Collection Time: 06/01/19 11:51 AM   Specimen: Nasopharyngeal  Result Value Ref Range Status   MRSA by PCR NEGATIVE NEGATIVE Final    Comment:        The GeneXpert MRSA Assay (FDA approved for NASAL specimens only), is one component of a comprehensive MRSA colonization surveillance program. It is not intended to diagnose MRSA infection nor to guide or monitor treatment for MRSA infections. Performed at [facility], [address], [place], [place] [address]      Labs: Basic Metabolic Panel: Recent Labs  Lab 06/02/19 [phone]  06/03/19 8756  06/03/19 1416 06/03/19 1807 06/04/19 0501 06/05/19 0446 06/07/19 0407  NA 142  --  142  --   --   --  138  143 143 143  K 6.3*   < > 5.2*  5.2*   < > 5.1 5.3* 4.5  4.6 3.9 3.9  CL 105  --  103  --   --   --  100  104 102 100  CO2 29  --  29  --   --   --  27  26 30 28   GLUCOSE 134*  --  92  --   --   --  172*  168* 107* 119*  BUN 31*  --  42*  --   --   --  47*  47* 55* 74*  CREATININE 1.80*  --  2.33*  --   --   --  2.36*  2.36* 2.74* 2.64*  CALCIUM 8.5*  --  8.4*  --   --   --  7.7*  7.9* 7.6* 7.7*  PHOS  --   --   --   --   --   --  6.5* 6.1*  --    < > = values in this interval not displayed.   Liver Function Tests: Recent Labs  Lab 06/04/19 0501 06/05/19 0446  ALBUMIN 3.0* 3.0*   CBC: Recent Labs  Lab 06/02/19 0415 06/04/19 0501  WBC 6.9 4.7  HGB 10.6* 10.0*  HCT 36.7 34.0*  MCV 92.7 91.4  PLT 234 203   BNP (last 3 results) Recent Labs    07/12/18 1335 05/31/19 1658  BNP 136.0* 206.0*    CBG: Recent Labs  Lab 06/07/19 1136 06/07/19 1628 06/07/19 2142 06/08/19 0752 06/08/19 1137  GLUCAP 131* 195* 226* 118*  163*    Signed:  Barton Dubois MD.  [facility] 06/08/2019, 2:20 PM

## 2019-06-08 NOTE — TOC Transition Note (Signed)
Transition of Care [facility]) - CM/SW Discharge Note   Patient Details  Name: [name] MRN: [id number] Date of Birth: 25-Oct-1945  Transition of Care [facility]) CM/SW Contact:  Monna Crean, [name], RN Phone Number: 06/08/2019, 11:09 AM   Clinical Narrative:   Patient discharging home today. NIV will be delivered and setup at her home today at 4 pm. Nebulizer will be delivered to her room. Son to transport home. Patient declines home health services, RN and PT. F/u appt scheduled.     Final next level of care: Home/Self Care Barriers to Discharge: Barriers Resolved   Patient Goals and CMS Choice Patient states their goals for this hospitalization and ongoing recovery are:: return home to baseline CMS Medicare.gov Compare Post Acute Care list provided to:: Patient Choice offered to / list presented to : Patient  Discharge Plan and Services                DME Arranged: NIV, Nebulizer machine DME Agency: [employer] Date DME Agency Contacted: 06/08/19 Time DME Agency Contacted: [phone] Representative spoke with at DME Agency: [employer] [facility]) Interventions     Readmission Risk Interventions Readmission Risk Prevention Plan 06/08/2019  Transportation Screening Complete  PCP or Specialist Appt within 3-5 Days Complete  HRI or [place] Not Complete  HRI or Home Care Consult comments patient declining home health  Social Work Consult for [place] Planning/Counseling [place] Not Applicable  Medication Review [employer]) Complete  Some recent data might be hidden

## 2019-06-15 ENCOUNTER — Inpatient Hospital Stay (HOSPITAL_COMMUNITY)
Admission: EM | Admit: 2019-06-15 | Discharge: 2019-06-21 | DRG: 308 | Disposition: A | Discharge status: Skilled Nursing Facility | Payer: Medicare Other | Source: Ambulatory Visit | Attending: Family Medicine | Admitting: Family Medicine

## 2019-06-15 ENCOUNTER — Encounter (HOSPITAL_COMMUNITY): Payer: Self-pay

## 2019-06-15 ENCOUNTER — Other Ambulatory Visit: Payer: Self-pay

## 2019-06-15 ENCOUNTER — Emergency Department (HOSPITAL_COMMUNITY): Payer: Medicare Other

## 2019-06-15 DIAGNOSIS — Z6841 Body Mass Index (BMI) 40.0 and over, adult: Secondary | ICD-10-CM

## 2019-06-15 DIAGNOSIS — K219 Gastro-esophageal reflux disease without esophagitis: Secondary | ICD-10-CM | POA: Diagnosis present

## 2019-06-15 DIAGNOSIS — Z515 Encounter for palliative care: Secondary | ICD-10-CM

## 2019-06-15 DIAGNOSIS — K449 Diaphragmatic hernia without obstruction or gangrene: Secondary | ICD-10-CM | POA: Diagnosis present

## 2019-06-15 DIAGNOSIS — E1122 Type 2 diabetes mellitus with diabetic chronic kidney disease: Secondary | ICD-10-CM | POA: Diagnosis present

## 2019-06-15 DIAGNOSIS — I4892 Unspecified atrial flutter: Principal | ICD-10-CM | POA: Diagnosis present

## 2019-06-15 DIAGNOSIS — K2100 Gastro-esophageal reflux disease with esophagitis, without bleeding: Secondary | ICD-10-CM | POA: Diagnosis present

## 2019-06-15 DIAGNOSIS — Z800 Family history of malignant neoplasm of digestive organs: Secondary | ICD-10-CM

## 2019-06-15 DIAGNOSIS — Z833 Family history of diabetes mellitus: Secondary | ICD-10-CM

## 2019-06-15 DIAGNOSIS — I5032 Chronic diastolic (congestive) heart failure: Secondary | ICD-10-CM | POA: Diagnosis present

## 2019-06-15 DIAGNOSIS — Z841 Family history of disorders of kidney and ureter: Secondary | ICD-10-CM

## 2019-06-15 DIAGNOSIS — Z20828 Contact with and (suspected) exposure to other viral communicable diseases: Secondary | ICD-10-CM | POA: Diagnosis present

## 2019-06-15 DIAGNOSIS — Z8249 Family history of ischemic heart disease and other diseases of the circulatory system: Secondary | ICD-10-CM

## 2019-06-15 DIAGNOSIS — N1832 Chronic kidney disease, stage 3b: Secondary | ICD-10-CM

## 2019-06-15 DIAGNOSIS — I4891 Unspecified atrial fibrillation: Secondary | ICD-10-CM | POA: Diagnosis present

## 2019-06-15 DIAGNOSIS — Z794 Long term (current) use of insulin: Secondary | ICD-10-CM | POA: Diagnosis not present

## 2019-06-15 DIAGNOSIS — I10 Essential (primary) hypertension: Secondary | ICD-10-CM | POA: Diagnosis not present

## 2019-06-15 DIAGNOSIS — R601 Generalized edema: Secondary | ICD-10-CM | POA: Diagnosis present

## 2019-06-15 DIAGNOSIS — Z87891 Personal history of nicotine dependence: Secondary | ICD-10-CM

## 2019-06-15 DIAGNOSIS — M810 Age-related osteoporosis without current pathological fracture: Secondary | ICD-10-CM | POA: Diagnosis present

## 2019-06-15 DIAGNOSIS — E538 Deficiency of other specified B group vitamins: Secondary | ICD-10-CM | POA: Diagnosis present

## 2019-06-15 DIAGNOSIS — Z9989 Dependence on other enabling machines and devices: Secondary | ICD-10-CM | POA: Diagnosis not present

## 2019-06-15 DIAGNOSIS — E669 Obesity, unspecified: Secondary | ICD-10-CM | POA: Diagnosis present

## 2019-06-15 DIAGNOSIS — Z90710 Acquired absence of both cervix and uterus: Secondary | ICD-10-CM

## 2019-06-15 DIAGNOSIS — I2510 Atherosclerotic heart disease of native coronary artery without angina pectoris: Secondary | ICD-10-CM | POA: Diagnosis present

## 2019-06-15 DIAGNOSIS — M109 Gout, unspecified: Secondary | ICD-10-CM | POA: Diagnosis present

## 2019-06-15 DIAGNOSIS — I5033 Acute on chronic diastolic (congestive) heart failure: Secondary | ICD-10-CM | POA: Diagnosis present

## 2019-06-15 DIAGNOSIS — J9611 Chronic respiratory failure with hypoxia: Secondary | ICD-10-CM | POA: Diagnosis present

## 2019-06-15 DIAGNOSIS — E1142 Type 2 diabetes mellitus with diabetic polyneuropathy: Secondary | ICD-10-CM | POA: Diagnosis present

## 2019-06-15 DIAGNOSIS — N179 Acute kidney failure, unspecified: Secondary | ICD-10-CM | POA: Diagnosis not present

## 2019-06-15 DIAGNOSIS — Z79899 Other long term (current) drug therapy: Secondary | ICD-10-CM

## 2019-06-15 DIAGNOSIS — J449 Chronic obstructive pulmonary disease, unspecified: Secondary | ICD-10-CM | POA: Diagnosis present

## 2019-06-15 DIAGNOSIS — G4733 Obstructive sleep apnea (adult) (pediatric): Secondary | ICD-10-CM

## 2019-06-15 DIAGNOSIS — Z823 Family history of stroke: Secondary | ICD-10-CM

## 2019-06-15 DIAGNOSIS — E782 Mixed hyperlipidemia: Secondary | ICD-10-CM | POA: Diagnosis present

## 2019-06-15 DIAGNOSIS — N183 Chronic kidney disease, stage 3 unspecified: Secondary | ICD-10-CM | POA: Diagnosis present

## 2019-06-15 DIAGNOSIS — Z8541 Personal history of malignant neoplasm of cervix uteri: Secondary | ICD-10-CM | POA: Diagnosis not present

## 2019-06-15 DIAGNOSIS — Z7982 Long term (current) use of aspirin: Secondary | ICD-10-CM | POA: Diagnosis not present

## 2019-06-15 DIAGNOSIS — Z825 Family history of asthma and other chronic lower respiratory diseases: Secondary | ICD-10-CM

## 2019-06-15 DIAGNOSIS — I361 Nonrheumatic tricuspid (valve) insufficiency: Secondary | ICD-10-CM | POA: Diagnosis not present

## 2019-06-15 DIAGNOSIS — I130 Hypertensive heart and chronic kidney disease with heart failure and stage 1 through stage 4 chronic kidney disease, or unspecified chronic kidney disease: Secondary | ICD-10-CM | POA: Diagnosis present

## 2019-06-15 DIAGNOSIS — Z8049 Family history of malignant neoplasm of other genital organs: Secondary | ICD-10-CM

## 2019-06-15 DIAGNOSIS — Z7951 Long term (current) use of inhaled steroids: Secondary | ICD-10-CM

## 2019-06-15 LAB — BASIC METABOLIC PANEL
Anion gap: 11 (ref 5–15)
BUN: 64 mg/dL — ABNORMAL HIGH (ref 8–23)
CO2: 30 mmol/L (ref 22–32)
Calcium: 8.2 mg/dL — ABNORMAL LOW (ref 8.9–10.3)
Chloride: 97 mmol/L — ABNORMAL LOW (ref 98–111)
Creatinine, Ser: 2.05 mg/dL — ABNORMAL HIGH (ref 0.44–1.00)
GFR calc Af Amer: 27 mL/min — ABNORMAL LOW (ref >60)
GFR calc non Af Amer: 23 mL/min — ABNORMAL LOW (ref >60)
Glucose, Bld: 181 mg/dL — ABNORMAL HIGH (ref 70–99)
Potassium: 3.9 mmol/L (ref 3.5–5.1)
Sodium: 138 mmol/L (ref 135–145)

## 2019-06-15 LAB — CBC
HCT: 36.8 % (ref 36.0–46.0)
Hemoglobin: 11.2 g/dL — ABNORMAL LOW (ref 12.0–15.0)
MCH: 26.9 pg (ref 26.0–34.0)
MCHC: 30.4 g/dL (ref 30.0–36.0)
MCV: 88.5 fL (ref 80.0–100.0)
Platelets: 250 10*3/uL (ref 150–400)
RBC: 4.16 MIL/uL (ref 3.87–5.11)
RDW: 14.8 % (ref 11.5–15.5)
WBC: 10.7 10*3/uL — ABNORMAL HIGH (ref 4.0–10.5)
nRBC: 0 % (ref 0.0–0.2)

## 2019-06-15 LAB — TROPONIN I (HIGH SENSITIVITY)
Troponin I (High Sensitivity): 25 ng/L — ABNORMAL HIGH (ref <18)
Troponin I (High Sensitivity): 28 ng/L — ABNORMAL HIGH (ref <18)

## 2019-06-15 LAB — BRAIN NATRIURETIC PEPTIDE: B Natriuretic Peptide: 327 pg/mL — ABNORMAL HIGH (ref 0.0–100.0)

## 2019-06-15 LAB — SARS CORONAVIRUS 2 (TAT 6-24 HRS): SARS Coronavirus 2: NEGATIVE

## 2019-06-15 MED ORDER — APIXABAN 2.5 MG PO TABS
2.5000 mg | ORAL_TABLET | Freq: Two times a day (BID) | ORAL | Status: DC
  Filled 2019-06-15 (×2): qty 1

## 2019-06-15 MED ORDER — PANTOPRAZOLE SODIUM 40 MG PO TBEC
40.0000 mg | DELAYED_RELEASE_TABLET | Freq: Every day | ORAL | Status: DC
  Administered 2019-06-16 – 2019-06-21 (×6): 40 mg via ORAL
  Filled 2019-06-15 (×6): qty 1

## 2019-06-15 MED ORDER — ATORVASTATIN CALCIUM 40 MG PO TABS
40.0000 mg | ORAL_TABLET | Freq: Every day | ORAL | Status: DC
  Administered 2019-06-15 – 2019-06-20 (×6): 40 mg via ORAL
  Filled 2019-06-15 (×6): qty 1

## 2019-06-15 MED ORDER — FUROSEMIDE 10 MG/ML IJ SOLN
80.0000 mg | Freq: Once | INTRAMUSCULAR | Status: AC
  Administered 2019-06-15: 15:00:00 80 mg via INTRAVENOUS
  Filled 2019-06-15: qty 8

## 2019-06-15 MED ORDER — DILTIAZEM HCL-DEXTROSE 125-5 MG/125ML-% IV SOLN (PREMIX): 5.0000 mg/h | INTRAVENOUS | Status: DC

## 2019-06-15 MED ORDER — SODIUM CHLORIDE 0.9% FLUSH
3.0000 mL | Freq: Once | INTRAVENOUS | Status: AC
  Administered 2019-06-15: 3 mL via INTRAVENOUS

## 2019-06-15 MED ORDER — ALLOPURINOL 100 MG PO TABS
100.0000 mg | ORAL_TABLET | Freq: Two times a day (BID) | ORAL | Status: DC
  Administered 2019-06-16 – 2019-06-21 (×11): 100 mg via ORAL
  Filled 2019-06-15 (×15): qty 1

## 2019-06-15 MED ORDER — DILTIAZEM HCL-DEXTROSE 125-5 MG/125ML-% IV SOLN (PREMIX)
5.0000 mg/h | INTRAVENOUS | Status: DC
  Administered 2019-06-15: 21:00:00 10 mg/h via INTRAVENOUS
  Administered 2019-06-15: 5 mg/h via INTRAVENOUS
  Filled 2019-06-15 (×2): qty 125

## 2019-06-15 MED ORDER — ONDANSETRON HCL 4 MG/2ML IJ SOLN: 4.0000 mg | Freq: Four times a day (QID) | INTRAMUSCULAR | Status: DC | PRN

## 2019-06-15 MED ORDER — APIXABAN 5 MG PO TABS: 5.0000 mg | ORAL_TABLET | Freq: Two times a day (BID) | ORAL | Status: DC

## 2019-06-15 MED ORDER — FUROSEMIDE 10 MG/ML IJ SOLN
80.0000 mg | Freq: Two times a day (BID) | INTRAMUSCULAR | Status: DC
  Administered 2019-06-16 – 2019-06-18 (×5): 80 mg via INTRAVENOUS
  Filled 2019-06-15 (×5): qty 8

## 2019-06-15 MED ORDER — DILTIAZEM LOAD VIA INFUSION
20.0000 mg | Freq: Once | INTRAVENOUS | Status: DC
  Filled 2019-06-15: qty 20

## 2019-06-15 MED ORDER — METOPROLOL SUCCINATE ER 50 MG PO TB24
75.0000 mg | ORAL_TABLET | Freq: Every morning | ORAL | Status: DC
  Administered 2019-06-16 – 2019-06-21 (×6): 75 mg via ORAL
  Filled 2019-06-15: qty 1
  Filled 2019-06-15: qty 3
  Filled 2019-06-15 (×4): qty 1

## 2019-06-15 MED ORDER — HYDRALAZINE HCL 25 MG PO TABS
25.0000 mg | ORAL_TABLET | Freq: Two times a day (BID) | ORAL | Status: DC
  Administered 2019-06-15: 25 mg via ORAL
  Filled 2019-06-15 (×2): qty 1

## 2019-06-15 MED ORDER — ACETAMINOPHEN 325 MG PO TABS
650.0000 mg | ORAL_TABLET | ORAL | Status: DC | PRN
  Administered 2019-06-16: 650 mg via ORAL
  Filled 2019-06-15: qty 2

## 2019-06-15 MED ORDER — DILTIAZEM LOAD VIA INFUSION
15.0000 mg | Freq: Once | INTRAVENOUS | Status: AC
  Administered 2019-06-15: 15 mg via INTRAVENOUS
  Filled 2019-06-15: qty 15

## 2019-06-15 MED ORDER — FLUTICASONE-UMECLIDIN-VILANT 100-62.5-25 MCG/INH IN AEPB: 1.0000 | INHALATION_SPRAY | Freq: Every day | RESPIRATORY_TRACT | Status: DC

## 2019-06-15 MED ORDER — ASPIRIN EC 81 MG PO TBEC
81.0000 mg | DELAYED_RELEASE_TABLET | Freq: Every day | ORAL | Status: DC
  Filled 2019-06-15: qty 1

## 2019-06-15 MED ORDER — DILTIAZEM HCL 25 MG/5ML IV SOLN
20.0000 mg | Freq: Once | INTRAVENOUS | Status: AC
  Administered 2019-06-15: 20 mg via INTRAVENOUS

## 2019-06-15 MED ORDER — METOPROLOL TARTRATE 5 MG/5ML IV SOLN
5.0000 mg | INTRAVENOUS | Status: DC | PRN
  Administered 2019-06-15: 5 mg via INTRAVENOUS
  Filled 2019-06-15 (×2): qty 5

## 2019-06-15 MED ORDER — AMITRIPTYLINE HCL 25 MG PO TABS
100.0000 mg | ORAL_TABLET | Freq: Every day | ORAL | Status: DC
  Administered 2019-06-16 – 2019-06-20 (×5): 100 mg via ORAL
  Filled 2019-06-15: qty 4
  Filled 2019-06-15: qty 1
  Filled 2019-06-15 (×4): qty 4
  Filled 2019-06-15: qty 1

## 2019-06-15 MED ORDER — FAMOTIDINE 20 MG PO TABS: 40.0000 mg | ORAL_TABLET | Freq: Every evening | ORAL | Status: DC | PRN

## 2019-06-15 MED ORDER — LOPERAMIDE HCL 2 MG PO CAPS: 2.0000 mg | ORAL_CAPSULE | ORAL | Status: DC | PRN

## 2019-06-15 MED ORDER — GUAIFENESIN ER 600 MG PO TB12
600.0000 mg | ORAL_TABLET | Freq: Two times a day (BID) | ORAL | Status: DC
  Administered 2019-06-15 – 2019-06-21 (×12): 600 mg via ORAL
  Filled 2019-06-15 (×12): qty 1

## 2019-06-15 MED ORDER — INSULIN ASPART 100 UNIT/ML ~~LOC~~ SOLN
0.0000 [IU] | Freq: Three times a day (TID) | SUBCUTANEOUS | Status: DC
  Administered 2019-06-16 (×2): 3 [IU] via SUBCUTANEOUS
  Administered 2019-06-17: 4 [IU] via SUBCUTANEOUS
  Administered 2019-06-18: 3 [IU] via SUBCUTANEOUS
  Administered 2019-06-18 – 2019-06-19 (×3): 4 [IU] via SUBCUTANEOUS
  Administered 2019-06-20: 3 [IU] via SUBCUTANEOUS
  Administered 2019-06-20: 4 [IU] via SUBCUTANEOUS
  Administered 2019-06-21: 3 [IU] via SUBCUTANEOUS
  Filled 2019-06-15 (×2): qty 1

## 2019-06-15 MED ORDER — INSULIN GLARGINE 100 UNIT/ML ~~LOC~~ SOLN
12.0000 [IU] | Freq: Every day | SUBCUTANEOUS | Status: DC
  Administered 2019-06-15 – 2019-06-20 (×6): 12 [IU] via SUBCUTANEOUS
  Filled 2019-06-15 (×7): qty 0.12

## 2019-06-15 MED ORDER — METOLAZONE 5 MG PO TABS
2.5000 mg | ORAL_TABLET | ORAL | Status: DC
  Administered 2019-06-16: 2.5 mg via ORAL
  Filled 2019-06-15 (×2): qty 1

## 2019-06-15 MED ORDER — APIXABAN 5 MG PO TABS
5.0000 mg | ORAL_TABLET | Freq: Two times a day (BID) | ORAL | Status: DC
  Administered 2019-06-15 – 2019-06-21 (×12): 5 mg via ORAL
  Filled 2019-06-15 (×12): qty 1

## 2019-06-15 NOTE — H&P (Signed)
History and Physical:    [name]   SJG:[id number] DOB: May 23, 1946 DOA: 06/15/2019   Referring MD/provider: Dr Regenia Skeeter PCP: [name], MD   Patient coming from: Home  Chief Complaint: Palpitations  History of Present Illness:   [name] is an 73 y.o. female with past medical history significant for hypertension, DM 2, CAD, COPD with chronic hypoxic respiratory failure, CKD 3, HFpEF who was recently discharged last week after treatment of acute decompensated heart failure with increased lower extremity edema and persistent anasarca now presents complaining of weakness.  Patient states she was doing per usual until about 2 days ago when she started feeling very very tired.  She also noted that she was a little bit more short of breath than usual.  She is not sure if she had increased lower extremity edema.  She felt that she had some soreness around her chest like someone had hit her.  No association with exertion.  No associated diaphoresis or nausea.  It did not come and go but has been constant over the past 2 days.  Patient was seen in PCP office and noted to have a heart rate of 190 and sent to the ED.  ED Course:  The patient was noted to have heart rate 150 with no acute ST-T wave changes.  She was given diltiazem 20 mg IV and started on diltiazem drip however heart rate continued around 150.  Cardiology was consulted and Dr. Harl Bowie saw patient in the ED and initiated Lopressor 5 mg as needed heart rate greater than 120.  He would like to avoid amiodarone given advanced respiratory illness and failure.  ROS:   ROS   Review of Systems: General: No fever, chills, Skin: No rashes, lesions, wounds Eyes: no discharge, redness, pain Endocrine: no heat/cold intolerance, no polyuria Respiratory: No hemoptysis hemoptysis GI: No nausea, vomiting, diarrhea, constipation GU: No dysuria, increased frequency   Past Medical History:   Past Medical  History:  Diagnosis Date  . Blood transfusion without reported diagnosis   . CAD (coronary artery disease)    Nonobstructive at cardiac catheterization 2000  . Cataract   . Cervical cancer ([place]) 1978  . CHF (congestive heart failure) ([place])   . Chronic back pain   . Chronic kidney disease   . COPD (chronic obstructive pulmonary disease) ([place])   . Degenerative disc disease   . Essential hypertension, benign   . GERD (gastroesophageal reflux disease)   . Gout   . Hiatal hernia 07/27/2013  . History of diverticulitis of colon   . History of hiatal hernia   . Iron deficiency anemia   . Irritable bowel syndrome   . Lumbar radiculopathy   . Mixed hyperlipidemia   . Neuropathy   . Osteoporosis   . Ovarian cancer [facility]) 1978   patient denies. States this was cervical cancer  . Oxygen deficiency   . Sleep apnea   . Type 2 diabetes mellitus ([place])   . Vitamin B deficiency 12/25/2009  . Vitamin B12 deficiency     Past Surgical History:   Past Surgical History:  Procedure Laterality Date  . ABDOMINAL HYSTERECTOMY    . BACK SURGERY    . Benign breast cysts    . CHOLECYSTECTOMY    . COLONOSCOPY  10/01/2006   SLF:Pan colonic diverticulosis and moderate internal hemorrhoids/ Otherwise no polyps, masses, inflammatory changes or AVMs/  . COLONOSCOPY  2011   SLF: pancolonic diverticulosis, large internal hemorrhoids  .  COLONOSCOPY N/A 01/26/2016   Procedure: COLONOSCOPY;  Surgeon: [name], MD;  Location: [facility];  Service: Endoscopy;  Laterality: N/A;  830   . ESOPHAGOGASTRODUODENOSCOPY  11/19/2006   SLF: Large hiatal hernia without evidence of Cameron ulcers/. Distal esophageal stricture, which allowed the gastroscope to pass without resistance.  A 16 mm Savary later passed with mild resistance/ Normal stomach.sb bx negative  . ESOPHAGOGASTRODUODENOSCOPY  10/01/2006   GUY:QIHKV hiatal hernia.  Distal esophagus without evidence of   erythema, ulceration or Barrett's esophagus   . ESOPHAGOGASTRODUODENOSCOPY  2011   SLF: large hh, distal esophageal web narrowing to 13mm s/p dilation to 3mm  . ESOPHAGOGASTRODUODENOSCOPY N/A 08/06/2013   SLF: 1. Stricture at the gastroesophageal junction 2. large hiatal hernia. 3. Mild erosive gastritis.  Marland Kitchen GIVENS CAPSULE STUDY N/A 08/06/2013   INCOMPLETE-SMALL BOWLE ULCERS  . KNEE SURGERY Right   . PARTIAL HYSTERECTOMY  1978  . small bowel capsule  2008   negative  . SPINE SURGERY    . TONSILLECTOMY AND ADENOIDECTOMY    . Two back surgeries/fusion    . UMBILICAL HERNIA REPAIR  2010    Social History:   Social History   Socioeconomic History  . Marital status: Widowed    Spouse name: Sconyers  . Number of children: 4  . Years of education: Not on file  . Highest education level: 10th grade  Occupational History  . 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 Financial resource strain: Not on file  . Food insecurity    Worry: Not on file    Inability: Not on file  . Transportation needs    Medical: Not on file    Non-medical: Not on file  Tobacco Use  . Smoking status: Former Smoker    Packs/day: 1.00    Years: 1.00    Pack years: 1.00    Types: Cigarettes    Start date: 02/19/1961    Quit date: 08/05/1961    Years since quitting: 57.8  . Smokeless tobacco: Never Used  . Tobacco comment: Quit smoking x 50 years  Substance and Sexual Activity  . Alcohol use: No  . Drug use: No  . Sexual activity: Not Currently  Lifestyle  . Physical activity    Days per week: Not on file    Minutes per session: Not on file  . Stress: Not on file  Relationships  . Social Herbalist on phone: Not on file    Gets together: Not on file    Attends religious service: Not on file    Active member of club or organization: Not on file    Attends meetings of clubs or organizations: Not on file    Relationship status: Not on file  . Intimate partner violence    Fear of current or ex partner:  Not on file    Emotionally abused: Not on file    Physically abused: Not on file    Forced sexual activity: Not on file  Other Topics Concern  . Not on file  Social History Narrative   HAS 4 SON-GRAND Phelps.   Lives in home with [place] - married 13 Y   Cook, sew, quilt, chochet    Allergies   Patient has no known allergies.  Family history:   Family History  Problem Relation Age of Onset  . Colon cancer Brother  diagnosed age 27. Living.   Marland Kitchen Ulcers Sister   . Diabetes Sister   . Heart attack Sister   . Kidney failure Sister   . Stroke Sister   . Ulcers Mother   . Diabetes Mother   . Heart attack Mother   . Stroke Mother   . Asthma Mother   . Heart disease Mother   . Cervical cancer Mother   . Heart attack Brother   . Heart disease Brother   . Asthma Sister   . Diabetes Brother   . Stroke Maternal Grandmother   . Heart attack Maternal Grandmother   . Heart attack Other   . Early death Father        MVA in his 57s    Current Medications:   Prior to Admission medications   Medication Sig Start Date End Date Taking? Authorizing Provider  albuterol (PROVENTIL) (2.5 MG/3ML) 0.083% nebulizer solution Take 2.5 mg by nebulization every 6 (six) hours as needed for wheezing or shortness of breath.  04/09/19   [provider]  albuterol (VENTOLIN HFA) 108 (90 Base) MCG/ACT inhaler Inhale 2 puffs into the lungs every 6 (six) hours as needed for wheezing or shortness of breath.  04/09/19   [provider]  allopurinol (ZYLOPRIM) 100 MG tablet Take 1 tablet by mouth 2 (two) times daily. 10/09/12   [provider]  amitriptyline (ELAVIL) 100 MG tablet Take 100 mg by mouth at bedtime.      [provider]  aspirin EC 81 MG tablet Take 81 mg by mouth daily.    [provider]  atorvastatin (LIPITOR) 40 MG tablet Take 1 tablet (40 mg total) by mouth at bedtime. 05/04/18   Satira Sark, MD  BIOTIN PO Take 1  capsule by mouth daily.    [provider]  esomeprazole (NEXIUM) 40 MG capsule TAKE 1 CAPSULE TWICE A DAY 30 MINUTES PRIOR TO MEALS Patient taking differently: Take 40 mg by mouth every morning. *May take one additional capsule if needed 01/19/18   Annitta Needs, NP  famotidine (PEPCID) 40 MG tablet TAKE 1 TABLET AT BEDTIME AS NEEDED TO CONTROL REFLUX Patient taking differently: Take 40 mg by mouth at bedtime as needed for heartburn or indigestion.  06/23/15   Mannam, Hart Robinsons, MD  guaiFENesin (MUCINEX) 600 MG 12 hr tablet Take 1 tablet (600 mg total) by mouth 2 (two) times daily. 06/08/19   Barton Dubois, MD  hydrALAZINE (APRESOLINE) 25 MG tablet Take 1 tablet (25 mg total) by mouth 2 (two) times daily. 03/16/19 06/14/19  Daune Perch, NP  insulin lispro (HUMALOG) 100 UNIT/ML KiwkPen Inject 14-16 Units into the skin See admin instructions.  03/12/16   [provider]  LANTUS SOLOSTAR 100 UNIT/ML SOPN Inject 16 Units into the skin at bedtime.  10/06/12   [provider]  loperamide (IMODIUM) 2 MG capsule Take 2 mg by mouth as needed for diarrhea or loose stools.    [provider]  meclizine (ANTIVERT) 25 MG tablet Take 1 tablet (25 mg total) by mouth 3 (three) times daily as needed for dizziness. 07/21/17   [name], MD  metolazone (ZAROXOLYN) 2.5 MG tablet Take 1 tablet (2.5 mg total) by mouth every Monday, Wednesday, and Friday. 06/09/19   Barton Dubois, MD  metoprolol succinate (TOPROL-XL) 50 MG 24 hr tablet Take 75 mg by mouth daily. Take with or immediately following a meal. Patient taking differently: Take 75 mg by mouth every morning.  10/13/18  Satira Sark, MD  predniSONE (DELTASONE) 20 MG tablet Take 2 tablets by mouth daily x2 days; then 1 tablet by mouth daily x3 days; then half tab by mouth daily x3 days and stop prednisone. 06/08/19   Barton Dubois, MD  torsemide (DEMADEX) 10 MG tablet Take 3 tablets (30 mg total) by mouth 2 (two) times  daily. 06/08/19   Barton Dubois, MD  TRELEGY ELLIPTA 100-62.5-25 MCG/INH AEPB Inhale 1 puff into the lungs daily. 05/13/19   [provider]    Physical Exam:   Vitals:   06/15/19 9242 06/15/19 1606 06/15/19 1608 06/15/19 1610  BP:      Pulse: (!) 51 74 75 76  Resp: 20 16 17 20   Temp:      TempSrc:      SpO2: 99% 100% 100% 100%     Physical Exam: Blood pressure 128/75, pulse 76, temperature 98.1 F (36.7 C), temperature source Oral, resp. rate 20, SpO2 100 %. Gen: Obese female lying at 30 degrees with tachypnea and mild respiratory muscle use.  She is able to speak in short sentences without difficulty but needs to take a breath between longer sentences. Eyes: Sclerae anicteric. Conjunctiva mildly injected. Chest: Decreased air entry bilaterally with rales at bases and a rare wheeze expiratory. CV: Irregular tachy.. Abdomen: Obese, soft, nontender.   Extremities: 2+ edema bilaterally and dependent areas up to her thigh. Neuro: Alert and oriented times 3; grossly nonfocal. Psych: Patient is cooperative, logical and coherent with appropriate mood and affect.  Data Review:    Labs: Basic Metabolic Panel: Recent Labs  Lab 06/15/19 1340  NA 138  K 3.9  CL 97*  CO2 30  GLUCOSE 181*  BUN 64*  CREATININE 2.05*  CALCIUM 8.2*   Liver Function Tests: No results for input(s): AST, ALT, ALKPHOS, BILITOT, PROT, ALBUMIN in the last 168 hours. No results for input(s): LIPASE, AMYLASE in the last 168 hours. No results for input(s): AMMONIA in the last 168 hours. CBC: Recent Labs  Lab 06/15/19 1340  WBC 10.7*  HGB 11.2*  HCT 36.8  MCV 88.5  PLT 250   Cardiac Enzymes: No results for input(s): CKTOTAL, CKMB, CKMBINDEX, TROPONINI in the last 168 hours.  BNP (last 3 results) No results for input(s): PROBNP in the last 8760 hours. CBG: No results for input(s): GLUCAP in the last 168 hours.  Urinalysis    Component Value Date/Time   COLORURINE STRAW (A)  05/31/2019 [place] 05/31/2019 1605   LABSPEC 1.006 05/31/2019 1605   PHURINE 6.0 05/31/2019 1605   GLUCOSEU NEGATIVE 05/31/2019 1605   HGBUR NEGATIVE 05/31/2019 [place] 05/31/2019 1605   KETONESUR NEGATIVE 05/31/2019 1605   PROTEINUR NEGATIVE 05/31/2019 1605   UROBILINOGEN 0.2 01/29/2007 1500   NITRITE NEGATIVE 05/31/2019 1605   LEUKOCYTESUR NEGATIVE 05/31/2019 1605      Radiographic Studies: Dg Chest Portable 1 View  Result Date: 06/15/2019 CLINICAL DATA:  Dyspnea EXAM: PORTABLE CHEST 1 VIEW COMPARISON:  05/31/2019, 05/08/2016, 07/12/2018 FINDINGS: Right-sided central venous port tip over the SVC. Retrocardiac opacity and lucency compatible with large hiatal hernia. Scarring at the bases. Stable cardiomediastinal silhouette. IMPRESSION: No active disease. Similar appearance of large hiatal hernia, mild cardiomegaly, and basilar scarring Electronically Signed   By: Donavan Foil M.D.   On: 06/15/2019 14:25    EKG: Independently reviewed.  Atrial flutter at 150.  T wave inversions 2, 3 and V6 likely rate related.   Assessment/Plan:  Principal Problem:   Atrial fibrillation with RVR (HCC) Active Problems:   DM type 2 with diabetic peripheral neuropathy (HCC)   Benign essential HTN   OSA on CPAP   Acid reflux   CKD (chronic kidney disease) stage 3, GFR 30-59 ml/min   Chronic obstructive pulmonary disease (HCC)   Chronic respiratory failure with hypoxia (HCC)   Chronic diastolic CHF (congestive heart failure) ([place])   Anasarca   73 year old female with multiple medical problems presents with new onset atrial fibrillation and RVR.  She is hemodynamically stable and is not complaining of any chest pain.   AFIB WITH RVR Patient seen by Dr. Harl Bowie in the ED, which I very much appreciate Her right had been unable to be controlled on diltiazem bolus and drip She has responded well to Lopressor 5 mg x 1, will continue to use as needed  Lopressor as needed per Dr. Nelly Laurence orders. They would like to avoid amiodarone given advanced COPD. Eliquis has been started  CAD Patient has some chest soreness that has been constant for the past 2 days, not typical for angina. EKG has some likely greatly related T wave inversions in 2 3 and V6. Initial high-sensitivity troponin is 25. We will continue aspirin, metoprolol and atorvastatin and follow troponins.  DM2 Will decrease glargine from 16 to 12 units while in house. High-dose sliding scale insulin AC at bedtime as well. Carb modified diet  HTN Continue metoprolol, hydralazine, metolazone and loop diuretic  COPD WITH CHRONIC HYPOXIC RESPIRATORY FAILURE  No evidence for acute decompensation at present Continue Trelegy Ellipta Would like to hold off on beta 1 agonist given difficulty controlling heart rate if possible for now Of course if patient starts wheezing or is short of breath we will need to give her inhaled bronchodilators.  HFpEF Unclear if patient has acute decompensation although patient does admit to being somewhat more short of breath than usual. Continue metoprolol, hydralazine and metolazone. Will hold torsemide and treat with Lasix 80 IV twice daily per Dr. Nelly Laurence recommendations.  CKD Creatinine is at her baseline even slightly improved from last week, GFR is persistent around 20. Continue to avoid renal toxins and overdiuresis.  OSA  Continue home CPAP.  GERD with gastritis Continue PPI and as needed Pepcid per home doses    Other information:   DVT prophylaxis: Lovenox ordered. Code Status: Full code. Family Communication: Patient states her family knows that she is here Disposition Plan: Home Consults called: Cardiology Admission status: Inpatient  The medical decision making on this patient was of high complexity and the patient is at high risk for clinical deterioration, therefore this is a level 3 visit.   Dewaine Oats Tublu   [facility]  If 7PM-7AM, please contact night-coverage [email or web address] Password [facility] 06/15/2019, 4:51 PM

## 2019-06-15 NOTE — ED Notes (Signed)
[name] (son) [phone]   Vernie Ammons (son's wife) [phone]

## 2019-06-15 NOTE — ED Triage Notes (Signed)
Pt sent by Dr Lorra Hals. Pt had follow up appointment from recent admission and states Dr Lorra Hals sent her because her "heart was racing." Pt states this started a few days ago and she started feeling weak then.

## 2019-06-15 NOTE — Consult Note (Signed)
Cardiology Consultation:   Patient ID: AMOS GABER MRN: [id number]; DOB: Sep 04, 1945  Admit date: 06/15/2019 Date of Consult: 06/15/2019  Primary Care Provider: [name], MD Primary Cardiologist: [name], MD  Primary Electrophysiologist:  None    Patient Profile:   MARICARMEN BRAZIEL is a 73 y.o. female with a hx of severe COPD who is being seen today for the evaluation of aflutter with RVR at the request of Dr Regenia Skeeter.  History of Present Illness:   Ms. Kirkwood 73 yo female history of severe endstage COPD, lymphedema, nonobstrcutive CAD by cath 2000 accordign to notes, CKD 3, HTN, OSA, chronic diastolic HF. Presents with tachycardia and SOB. She was sent from her pcp office. She denies any palpitations herself. Mild worsening of her chronic SOB over the last several days.   In ER found to be in aflutter with RVR, started on dilt gtt.    K 3.9 Cr 2.05 BUN 64 WBC 10.7 Hgb 11.2 Plt 250 BNP 327  COVID pending CXR no acute process 05/2016 echo: LVEF 60-65%, grade II diastolic dysfunction     Heart Pathway Score:     Past Medical History:  Diagnosis Date  . Blood transfusion without reported diagnosis   . CAD (coronary artery disease)    Nonobstructive at cardiac catheterization 2000  . Cataract   . Cervical cancer ([place]) 1978  . CHF (congestive heart failure) ([place])   . Chronic back pain   . Chronic kidney disease   . COPD (chronic obstructive pulmonary disease) ([place])   . Degenerative disc disease   . Essential hypertension, benign   . GERD (gastroesophageal reflux disease)   . Gout   . Hiatal hernia 07/27/2013  . History of diverticulitis of colon   . History of hiatal hernia   . Iron deficiency anemia   . Irritable bowel syndrome   . Lumbar radiculopathy   . Mixed hyperlipidemia   . Neuropathy   . Osteoporosis   . Ovarian cancer [facility]) 1978   patient denies. States this was cervical cancer  . Oxygen deficiency   . Sleep apnea   . Type 2  diabetes mellitus ([place])   . Vitamin B deficiency 12/25/2009  . Vitamin B12 deficiency     Past Surgical History:  Procedure Laterality Date  . ABDOMINAL HYSTERECTOMY    . BACK SURGERY    . Benign breast cysts    . CHOLECYSTECTOMY    . COLONOSCOPY  10/01/2006   SLF:Pan colonic diverticulosis and moderate internal hemorrhoids/ Otherwise no polyps, masses, inflammatory changes or AVMs/  . COLONOSCOPY  2011   SLF: pancolonic diverticulosis, large internal hemorrhoids  . COLONOSCOPY N/A 01/26/2016   Procedure: COLONOSCOPY;  Surgeon: [name], MD;  Location: [facility];  Service: Endoscopy;  Laterality: N/A;  830   . ESOPHAGOGASTRODUODENOSCOPY  11/19/2006   SLF: Large hiatal hernia without evidence of Cameron ulcers/. Distal esophageal stricture, which allowed the gastroscope to pass without resistance.  A 16 mm Savary later passed with mild resistance/ Normal stomach.sb bx negative  . ESOPHAGOGASTRODUODENOSCOPY  10/01/2006   YOV:ZCHYI hiatal hernia.  Distal esophagus without evidence of   erythema, ulceration or Barrett's esophagus  . ESOPHAGOGASTRODUODENOSCOPY  2011   SLF: large hh, distal esophageal web narrowing to 11mm s/p dilation to 40mm  . ESOPHAGOGASTRODUODENOSCOPY N/A 08/06/2013   SLF: 1. Stricture at the gastroesophageal junction 2. large hiatal hernia. 3. Mild erosive gastritis.  Marland Kitchen GIVENS CAPSULE STUDY N/A 08/06/2013   INCOMPLETE-SMALL BOWLE ULCERS  .  KNEE SURGERY Right   . PARTIAL HYSTERECTOMY  1978  . small bowel capsule  2008   negative  . SPINE SURGERY    . TONSILLECTOMY AND ADENOIDECTOMY    . Two back surgeries/fusion    . UMBILICAL HERNIA REPAIR  2010     Inpatient Medications: Scheduled Meds:  Continuous Infusions: . diltiazem (CARDIZEM) infusion 15 mg/hr (06/15/19 1500)   PRN Meds:   Allergies:   No Known Allergies  Social History:   Social History   Socioeconomic History  . Marital status: Widowed    Spouse name: Mallen  . Number of children:  4  . Years of education: Not on file  . Highest education level: 10th grade  Occupational History  . Occupation: retired    Comment: Ambulance person, [employer] work  Scientific laboratory technician  . Financial resource strain: Not on file  . Food insecurity    Worry: Not on file    Inability: Not on file  . Transportation needs    Medical: Not on file    Non-medical: Not on file  Tobacco Use  . Smoking status: Former Smoker    Packs/day: 1.00    Years: 1.00    Pack years: 1.00    Types: Cigarettes    Start date: 02/19/1961    Quit date: 08/05/1961    Years since quitting: 57.8  . Smokeless tobacco: Never Used  . Tobacco comment: Quit smoking x 50 years  Substance and Sexual Activity  . Alcohol use: No  . Drug use: No  . Sexual activity: Not Currently  Lifestyle  . Physical activity    Days per week: Not on file    Minutes per session: Not on file  . Stress: Not on file  Relationships  . Social Herbalist on phone: Not on file    Gets together: Not on file    Attends religious service: Not on file    Active member of club or organization: Not on file    Attends meetings of clubs or organizations: Not on file    Relationship status: Not on file  . Intimate partner violence    Fear of current or ex partner: Not on file    Emotionally abused: Not on file    Physically abused: Not on file    Forced sexual activity: Not on file  Other Topics Concern  . Not on file  Social History Narrative   HAS 4 SON-GRAND [place].   Lives in home with Stehlin - married 13 Y   Cook, sew, quilt, chochet    Family History:    Family History  Problem Relation Age of Onset  . Colon cancer Brother        diagnosed age 78. Living.   Marland Kitchen Ulcers Sister   . Diabetes Sister   . Heart attack Sister   . Kidney failure Sister   . Stroke Sister   . Ulcers Mother   . Diabetes Mother   . Heart attack Mother   . Stroke Mother   . Asthma Mother   . Heart disease Mother   . Cervical cancer  Mother   . Heart attack Brother   . Heart disease Brother   . Asthma Sister   . Diabetes Brother   . Stroke Maternal Grandmother   . Heart attack Maternal Grandmother   . Heart attack Other   . Early death Father        MVA in his  40s     ROS:  Please see the history of present illness.  All other ROS reviewed and negative.     Physical Exam/Data:   Vitals:   06/15/19 1400 06/15/19 1430 06/15/19 1444 06/15/19 1459  BP: 127/86 116/74    Pulse:      Resp: 13 13 16 19   Temp:      TempSrc:      SpO2:        Intake/Output Summary (Last 24 hours) at 06/15/2019 1521 Last data filed at 06/15/2019 1500 Gross per 24 hour  Intake 28.11 ml  Output -  Net 28.11 ml   Last 3 Weights 06/08/2019 06/07/2019 06/06/2019  Weight (lbs) (No Data) 246 lb 11.1 oz 244 lb 11.4 oz  Weight (kg) (No Data) 111.9 kg 111 kg     There is no height or weight on file to calculate BMI.  General:  Well nourished, well developed, in no acute distress HEENT: normal Lymph: no adenopathy Neck: no JVD Endocrine:  No thryomegaly Vascular: No carotid bruits; FA pulses 2+ bilaterally without bruits  Cardiac:  Regular tachy, no m/r/g Lungs:  clear to auscultation bilaterally, no wheezing, rhonchi or rales  Abd: soft, nontender, no hepatomegaly  Ext: 2+ bilateral LE edema Musculoskeletal:  No deformities, BUE and BLE strength normal and equal Skin: warm and dry  Neuro:  CNs 2-12 intact, no focal abnormalities noted Psych:  Normal affect    Laboratory Data:  High Sensitivity Troponin:  No results for input(s): TROPONINIHS in the last 720 hours.   Chemistry Recent Labs  Lab 06/15/19 1340  NA 138  K 3.9  CL 97*  CO2 30  GLUCOSE 181*  BUN 64*  CREATININE 2.05*  CALCIUM 8.2*  GFRNONAA 23*  GFRAA 27*  ANIONGAP 11    No results for input(s): PROT, ALBUMIN, AST, ALT, ALKPHOS, BILITOT in the last 168 hours. Hematology Recent Labs  Lab 06/15/19 1340  WBC 10.7*  RBC 4.16  HGB 11.2*  HCT 36.8   MCV 88.5  MCH 26.9  MCHC 30.4  RDW 14.8  PLT 250   BNP Recent Labs  Lab 06/15/19 1347  BNP 327.0*    DDimer No results for input(s): DDIMER in the last 168 hours.   Radiology/Studies:  Dg Chest Portable 1 View  Result Date: 06/15/2019 CLINICAL DATA:  Dyspnea EXAM: PORTABLE CHEST 1 VIEW COMPARISON:  05/31/2019, 05/08/2016, 07/12/2018 FINDINGS: Right-sided central venous port tip over the SVC. Retrocardiac opacity and lucency compatible with large hiatal hernia. Scarring at the bases. Stable cardiomediastinal silhouette. IMPRESSION: No active disease. Similar appearance of large hiatal hernia, mild cardiomegaly, and basilar scarring Electronically Signed   By: Donavan Foil M.D.   On: 06/15/2019 14:25    Assessment and Plan:   1. Aflutter with RVR - new diagnosis this admission In ER bolused 15mg  of dilt, then started on dilt gtt. Dilt gtt up to 15mg /hr. Rates remain elevated, will give IV lopressor q70min prn HR >120, continue dilt gtt. Can rebolus dilt gtt as well if bp's holding.  - could consider short course of amio if needed, would try to avoid long term use given her lung disease - I think would be difficult given her chronic lung disease to consider TEE/DCCV. Only can control with meds alone.  CHADS2Vasc score is (CHF, HTN age x1, gender, CAD) 82. Start eliquis 5mg  bid  2. Acute on chronic diastolic HF - difficult assessment of volume status given body habitus and chronic lymphedema - would  dose her with IV lasix today 80mg  bid and reassess tomorrow       For questions or updates, please contact [place] Please consult [email or web address] for contact info under     Signed, Carlyle Dolly, MD  06/15/2019 3:21 PM

## 2019-06-15 NOTE — ED Notes (Signed)
ED Provider at bedside. 

## 2019-06-15 NOTE — ED Provider Notes (Signed)
[facility] EMERGENCY DEPARTMENT Provider Note   CSN: [id number] Arrival date & time: 06/15/19  1141     History   Chief Complaint Chief Complaint  Patient presents with  . Tachycardia    HPI [name] is a 73 y.o. female.     HPI  73 year old female presents with tachycardia and shortness of breath.  Has felt an abnormal sensation in her chest, kind of like fluttering/palpitations for 2-4 days.  Has never had this before and denies a known history of A. fib.  Was at her doctor's office today and her heart rate was reportedly 190 and she was sent here.  Also a change in cough with shortness of breath that seems worse over the past similar time.  Chronic leg edema though a little bit worse today.  She does not think her weight gain is as bad as it was when she was last admitted.  No fevers.  Chest does not feel right but is not really hurting.  Past Medical History:  Diagnosis Date  . Blood transfusion without reported diagnosis   . CAD (coronary artery disease)    Nonobstructive at cardiac catheterization 2000  . Cataract   . Cervical cancer ([place]) 1978  . CHF (congestive heart failure) ([place])   . Chronic back pain   . Chronic kidney disease   . COPD (chronic obstructive pulmonary disease) ([place])   . Degenerative disc disease   . Essential hypertension, benign   . GERD (gastroesophageal reflux disease)   . Gout   . Hiatal hernia 07/27/2013  . History of diverticulitis of colon   . History of hiatal hernia   . Iron deficiency anemia   . Irritable bowel syndrome   . Lumbar radiculopathy   . Mixed hyperlipidemia   . Neuropathy   . Osteoporosis   . Ovarian cancer [facility]) 1978   patient denies. States this was cervical cancer  . Oxygen deficiency   . Sleep apnea   . Type 2 diabetes mellitus ([place])   . Vitamin B deficiency 12/25/2009  . Vitamin B12 deficiency     Patient Active Problem List   Diagnosis Date Noted  . Dyspnea and respiratory abnormalities  06/01/2019  . Acute on chronic respiratory failure with hypoxia and hypercapnia with respiratory acidosis 06/01/2019  . Anasarca 05/31/2019  . Nausea without vomiting 05/31/2019  . Generalized abdominal pain 05/31/2019  . History of colonic polyps 05/31/2019  . Lymphedema of both lower extremities 04/28/2019  . Chronic respiratory failure with hypoxia ([place]) 07/13/2018  . Acute renal failure superimposed on stage 3 chronic kidney disease ([place]) 07/13/2018  . Chronic diastolic CHF (congestive heart failure) ([place]) 07/13/2018  . COPD (chronic obstructive pulmonary disease) ([place]) 07/13/2018  . COPD exacerbation ([place]) 07/12/2018  . Flatulence 09/18/2017  . IBS (irritable bowel syndrome) 04/03/2017  . Gout 03/15/2017  . Acute on chronic diastolic CHF (congestive heart failure) ([place]) 05/08/2016  . CKD (chronic kidney disease) stage 3, GFR 30-59 ml/min 05/08/2016  . COPD with acute exacerbation ([place]) 05/08/2016  . Constipation 04/25/2016  . Peripheral vertigo 06/27/2015  . Port-A-Cath in place 10/06/2014  . Chronic obstructive pulmonary disease ([place]) 04/30/2014  . Obesity, Class III, BMI 40-49.9 (morbid obesity) ([place]) 04/30/2014  . Hemorrhoids, internal, with bleeding 11/10/2013  . Anemia 08/18/2013  . Acid reflux   . Obstructive chronic bronchitis without exacerbation ([place]) 12/26/2009  . Dysphagia 12/26/2009  . DM type 2 with diabetic peripheral neuropathy ([place]) 12/25/2009  . Vitamin B deficiency 12/25/2009  .  Iron deficiency anemia due to chronic blood loss 12/25/2009  . Benign essential HTN 12/25/2009  . DEGENERATIVE DISC DISEASE 12/25/2009  . OSA on CPAP 12/25/2009  . HLD (hyperlipidemia) 12/25/2009  . Type 2 diabetes with nephropathy ([place]) 12/25/2009    Past Surgical History:  Procedure Laterality Date  . ABDOMINAL HYSTERECTOMY    . BACK SURGERY    . Benign breast cysts    . CHOLECYSTECTOMY    . COLONOSCOPY  10/01/2006   SLF:Pan colonic diverticulosis and moderate internal  hemorrhoids/ Otherwise no polyps, masses, inflammatory changes or AVMs/  . COLONOSCOPY  2011   SLF: pancolonic diverticulosis, large internal hemorrhoids  . COLONOSCOPY N/A 01/26/2016   Procedure: COLONOSCOPY;  Surgeon: [name], MD;  Location: [facility] ENDO SUITE;  Service: Endoscopy;  Laterality: N/A;  830   . ESOPHAGOGASTRODUODENOSCOPY  11/19/2006   SLF: Large hiatal hernia without evidence of Cameron ulcers/. Distal esophageal stricture, which allowed the gastroscope to pass without resistance.  A 16 mm Savary later passed with mild resistance/ Normal stomach.sb bx negative  . ESOPHAGOGASTRODUODENOSCOPY  10/01/2006   PYP:PJKDT hiatal hernia.  Distal esophagus without evidence of   erythema, ulceration or Barrett's esophagus  . ESOPHAGOGASTRODUODENOSCOPY  2011   SLF: large hh, distal esophageal web narrowing to 60mm s/p dilation to 46mm  . ESOPHAGOGASTRODUODENOSCOPY N/A 08/06/2013   SLF: 1. Stricture at the gastroesophageal junction 2. large hiatal hernia. 3. Mild erosive gastritis.  Marland Kitchen GIVENS CAPSULE STUDY N/A 08/06/2013   INCOMPLETE-SMALL BOWLE ULCERS  . KNEE SURGERY Right   . PARTIAL HYSTERECTOMY  1978  . small bowel capsule  2008   negative  . SPINE SURGERY    . TONSILLECTOMY AND ADENOIDECTOMY    . Two back surgeries/fusion    . UMBILICAL HERNIA REPAIR  2010     OB History   No obstetric history on file.      Home Medications    Prior to Admission medications   Medication Sig Start Date End Date Taking? Authorizing Provider  albuterol (PROVENTIL) (2.5 MG/3ML) 0.083% nebulizer solution Take 2.5 mg by nebulization every 6 (six) hours as needed for wheezing or shortness of breath.  04/09/19   [provider]  albuterol (VENTOLIN HFA) 108 (90 Base) MCG/ACT inhaler Inhale 2 puffs into the lungs every 6 (six) hours as needed for wheezing or shortness of breath.  04/09/19   [provider]  allopurinol (ZYLOPRIM) 100 MG tablet Take 1 tablet by mouth 2 (two) times daily.  10/09/12   [provider]  amitriptyline (ELAVIL) 100 MG tablet Take 100 mg by mouth at bedtime.      [provider]  aspirin EC 81 MG tablet Take 81 mg by mouth daily.    [provider]  atorvastatin (LIPITOR) 40 MG tablet Take 1 tablet (40 mg total) by mouth at bedtime. 05/04/18   Satira Sark, MD  BIOTIN PO Take 1 capsule by mouth daily.    [provider]  esomeprazole (NEXIUM) 40 MG capsule TAKE 1 CAPSULE TWICE A DAY 30 MINUTES PRIOR TO MEALS Patient taking differently: Take 40 mg by mouth every morning. *May take one additional capsule if needed 01/19/18   Annitta Needs, NP  famotidine (PEPCID) 40 MG tablet TAKE 1 TABLET AT BEDTIME AS NEEDED TO CONTROL REFLUX Patient taking differently: Take 40 mg by mouth at bedtime as needed for heartburn or indigestion.  06/23/15   Mannam, Hart Robinsons, MD  guaiFENesin (MUCINEX) 600 MG 12 hr tablet Take 1  tablet (600 mg total) by mouth 2 (two) times daily. 06/08/19   Barton Dubois, MD  hydrALAZINE (APRESOLINE) 25 MG tablet Take 1 tablet (25 mg total) by mouth 2 (two) times daily. 03/16/19 06/14/19  Daune Perch, NP  insulin lispro (HUMALOG) 100 UNIT/ML KiwkPen Inject 14-16 Units into the skin See admin instructions.  03/12/16   [provider]  LANTUS SOLOSTAR 100 UNIT/ML SOPN Inject 16 Units into the skin at bedtime.  10/06/12   [provider]  loperamide (IMODIUM) 2 MG capsule Take 2 mg by mouth as needed for diarrhea or loose stools.    [provider]  meclizine (ANTIVERT) 25 MG tablet Take 1 tablet (25 mg total) by mouth 3 (three) times daily as needed for dizziness. 07/21/17   [name], MD  metolazone (ZAROXOLYN) 2.5 MG tablet Take 1 tablet (2.5 mg total) by mouth every Monday, Wednesday, and Friday. 06/09/19   Barton Dubois, MD  metoprolol succinate (TOPROL-XL) 50 MG 24 hr tablet Take 75 mg by mouth daily. Take with or immediately following a meal. Patient taking differently: Take  75 mg by mouth every morning.  10/13/18   Satira Sark, MD  predniSONE (DELTASONE) 20 MG tablet Take 2 tablets by mouth daily x2 days; then 1 tablet by mouth daily x3 days; then half tab by mouth daily x3 days and stop prednisone. 06/08/19   Barton Dubois, MD  torsemide (DEMADEX) 10 MG tablet Take 3 tablets (30 mg total) by mouth 2 (two) times daily. 06/08/19   Barton Dubois, MD  TRELEGY ELLIPTA 100-62.5-25 MCG/INH AEPB Inhale 1 puff into the lungs daily. 05/13/19   [provider]    Family History Family History  Problem Relation Age of Onset  . Colon cancer Brother        diagnosed age 49. Living.   Marland Kitchen Ulcers Sister   . Diabetes Sister   . Heart attack Sister   . Kidney failure Sister   . Stroke Sister   . Ulcers Mother   . Diabetes Mother   . Heart attack Mother   . Stroke Mother   . Asthma Mother   . Heart disease Mother   . Cervical cancer Mother   . Heart attack Brother   . Heart disease Brother   . Asthma Sister   . Diabetes Brother   . Stroke Maternal Grandmother   . Heart attack Maternal Grandmother   . Heart attack Other   . Early death Father        MVA in his 23s    Social History Social History   Tobacco Use  . Smoking status: Former Smoker    Packs/day: 1.00    Years: 1.00    Pack years: 1.00    Types: Cigarettes    Start date: 02/19/1961    Quit date: 08/05/1961    Years since quitting: 57.8  . Smokeless tobacco: Never Used  . Tobacco comment: Quit smoking x 50 years  Substance Use Topics  . Alcohol use: No  . Drug use: No     Allergies   Patient has no known allergies.   Review of Systems Review of Systems  Constitutional: Negative for fever.  Respiratory: Positive for cough and shortness of breath.   Cardiovascular: Positive for chest pain, palpitations and leg swelling.  All other systems reviewed and are negative.    Physical Exam Updated Vital Signs BP 116/74   Pulse (!) 153   Temp 98.1 F (36.7 C) (Oral)  Resp 19    SpO2 100%   Physical Exam Vitals signs and nursing note reviewed.  Constitutional:      General: She is not in acute distress.    Appearance: She is well-developed. She is obese. She is not diaphoretic.  HENT:     Head: Normocephalic and atraumatic.     Right Ear: External ear normal.     Left Ear: External ear normal.     Nose: Nose normal.  Eyes:     General:        Right eye: No discharge.        Left eye: No discharge.  Cardiovascular:     Rate and Rhythm: Regular rhythm. Tachycardia present.     Heart sounds: Normal heart sounds.  Pulmonary:     Effort: Pulmonary effort is normal. Tachypnea present. No accessory muscle usage.     Breath sounds: Wheezing and rales present.  Abdominal:     Palpations: Abdomen is soft.     Tenderness: There is no abdominal tenderness.  Musculoskeletal:     Right lower leg: Edema present.     Left lower leg: Edema present.     Comments: Diffuse lower extremity pitting edema bilaterally  Skin:    General: Skin is warm and dry.  Neurological:     Mental Status: She is alert.  Psychiatric:        Mood and Affect: Mood is not anxious.      ED Treatments / Results  Labs (all labs ordered are listed, but only abnormal results are displayed) Labs Reviewed  BASIC METABOLIC PANEL - Abnormal; Notable for the following components:      Result Value   Chloride 97 (*)    Glucose, Bld 181 (*)    BUN 64 (*)    Creatinine, Ser 2.05 (*)    Calcium 8.2 (*)    GFR calc non Af Amer 23 (*)    GFR calc Af Amer 27 (*)    All other components within normal limits  CBC - Abnormal; Notable for the following components:   WBC 10.7 (*)    Hemoglobin 11.2 (*)    All other components within normal limits  BRAIN NATRIURETIC PEPTIDE - Abnormal; Notable for the following components:   B Natriuretic Peptide 327.0 (*)    All other components within normal limits  SARS CORONAVIRUS 2 (TAT 6-24 HRS)    EKG EKG Interpretation  Date/Time:  Tuesday  June 15 2019 12:55:57 EST Ventricular Rate:  154 PR Interval:    QRS Duration: 104 QT Interval:  298 QTC Calculation: 477 R Axis:   82 Text Interpretation: Atrial fibrillation with rapid ventricular response ST & T wave abnormality, consider inferior ischemia Abnormal ECG Confirmed by Sherwood Gambler [phone]) on 06/15/2019 1:04:10 PM   Radiology Dg Chest Portable 1 View  Result Date: 06/15/2019 CLINICAL DATA:  Dyspnea EXAM: PORTABLE CHEST 1 VIEW COMPARISON:  05/31/2019, 05/08/2016, 07/12/2018 FINDINGS: Right-sided central venous port tip over the SVC. Retrocardiac opacity and lucency compatible with large hiatal hernia. Scarring at the bases. Stable cardiomediastinal silhouette. IMPRESSION: No active disease. Similar appearance of large hiatal hernia, mild cardiomegaly, and basilar scarring Electronically Signed   By: Donavan Foil M.D.   On: 06/15/2019 14:25    Procedures .Critical Care Performed by: Sherwood Gambler, MD Authorized by: Sherwood Gambler, MD   Critical care provider statement:    Critical care time (minutes):  35   Critical care time was exclusive of:  Separately billable  procedures and treating other patients   Critical care was necessary to treat or prevent imminent or life-threatening deterioration of the following conditions:  Cardiac failure   Critical care was time spent personally by me on the following activities:  Discussions with consultants, evaluation of patient's response to treatment, examination of patient, ordering and performing treatments and interventions, ordering and review of laboratory studies, ordering and review of radiographic studies, pulse oximetry, re-evaluation of patient's condition, obtaining history from patient or surrogate and review of old charts   (including critical care time)  Medications Ordered in ED Medications  diltiazem (CARDIZEM) 1 mg/mL load via infusion 15 mg (15 mg Intravenous Bolus from Bag 06/15/19 1401)    And   diltiazem (CARDIZEM) 125 mg in dextrose 5% 125 mL (1 mg/mL) infusion (15 mg/hr Intravenous Rate/Dose Verify 06/15/19 1500)  sodium chloride flush (NS) 0.9 % injection 3 mL (3 mLs Intravenous Given 06/15/19 1345)  diltiazem (CARDIZEM) injection 20 mg (20 mg Intravenous Bolus 06/15/19 1437)  furosemide (LASIX) injection 80 mg (80 mg Intravenous Given 06/15/19 1456)     Initial Impression / Assessment and Plan / ED Course  I have reviewed the triage vital signs and the nursing notes.  Pertinent labs & imaging results that were available during my care of the patient were reviewed by me and considered in my medical decision making (see chart for details).        Patient has new onset afib with RVR. Mentating fine, no distress. BP is ok. Slight improvement on cardizem drip with 2 boluses. Probably has some CHF too. No distress or ACS symptoms. Dr. Jamse Arn will admit. D/w Dr. Harl Bowie of cardiology, he will consult for further rate control recommendations.  Final Clinical Impressions(s) / ED Diagnoses   Final diagnoses:  Atrial fibrillation with RVR [facility])    ED Discharge Orders    None       Sherwood Gambler, MD 06/15/19 [phone]

## 2019-06-15 NOTE — ED Notes (Signed)
EDP made aware of pt's HR even after increased gtt to 15/hr

## 2019-06-16 ENCOUNTER — Inpatient Hospital Stay (HOSPITAL_COMMUNITY): Payer: Medicare Other

## 2019-06-16 ENCOUNTER — Other Ambulatory Visit: Payer: Self-pay

## 2019-06-16 DIAGNOSIS — I361 Nonrheumatic tricuspid (valve) insufficiency: Secondary | ICD-10-CM

## 2019-06-16 DIAGNOSIS — R601 Generalized edema: Secondary | ICD-10-CM

## 2019-06-16 DIAGNOSIS — I4892 Unspecified atrial flutter: Secondary | ICD-10-CM | POA: Diagnosis present

## 2019-06-16 LAB — CBG MONITORING, ED
Glucose-Capillary: 130 mg/dL — ABNORMAL HIGH (ref 70–99)
Glucose-Capillary: 127 mg/dL — ABNORMAL HIGH (ref 70–99)

## 2019-06-16 LAB — BASIC METABOLIC PANEL
Anion gap: 13 (ref 5–15)
BUN: 63 mg/dL — ABNORMAL HIGH (ref 8–23)
CO2: 28 mmol/L (ref 22–32)
Calcium: 8.1 mg/dL — ABNORMAL LOW (ref 8.9–10.3)
Chloride: 98 mmol/L (ref 98–111)
Creatinine, Ser: 1.98 mg/dL — ABNORMAL HIGH (ref 0.44–1.00)
GFR calc Af Amer: 28 mL/min — ABNORMAL LOW (ref >60)
GFR calc non Af Amer: 24 mL/min — ABNORMAL LOW (ref >60)
Glucose, Bld: 166 mg/dL — ABNORMAL HIGH (ref 70–99)
Potassium: 4.1 mmol/L (ref 3.5–5.1)
Sodium: 139 mmol/L (ref 135–145)

## 2019-06-16 LAB — CBC
HCT: 35.6 % — ABNORMAL LOW (ref 36.0–46.0)
Hemoglobin: 10.6 g/dL — ABNORMAL LOW (ref 12.0–15.0)
MCH: 26.8 pg (ref 26.0–34.0)
MCHC: 29.8 g/dL — ABNORMAL LOW (ref 30.0–36.0)
MCV: 90.1 fL (ref 80.0–100.0)
Platelets: 231 10*3/uL (ref 150–400)
RBC: 3.95 MIL/uL (ref 3.87–5.11)
RDW: 14.7 % (ref 11.5–15.5)
WBC: 12.7 10*3/uL — ABNORMAL HIGH (ref 4.0–10.5)
nRBC: 0 % (ref 0.0–0.2)

## 2019-06-16 LAB — GLUCOSE, CAPILLARY
Glucose-Capillary: 114 mg/dL — ABNORMAL HIGH (ref 70–99)
Glucose-Capillary: 153 mg/dL — ABNORMAL HIGH (ref 70–99)

## 2019-06-16 LAB — ECHOCARDIOGRAM COMPLETE

## 2019-06-16 MED ORDER — FLUTICASONE FUROATE-VILANTEROL 100-25 MCG/INH IN AEPB
1.0000 | INHALATION_SPRAY | Freq: Every day | RESPIRATORY_TRACT | Status: DC
  Administered 2019-06-16 – 2019-06-21 (×6): 1 via RESPIRATORY_TRACT
  Filled 2019-06-16: qty 28

## 2019-06-16 MED ORDER — ALBUTEROL SULFATE (2.5 MG/3ML) 0.083% IN NEBU
5.0000 mg | INHALATION_SOLUTION | Freq: Once | RESPIRATORY_TRACT | Status: AC
  Administered 2019-06-16: 5 mg via RESPIRATORY_TRACT

## 2019-06-16 MED ORDER — ALBUTEROL SULFATE (2.5 MG/3ML) 0.083% IN NEBU
INHALATION_SOLUTION | RESPIRATORY_TRACT | Status: AC
  Filled 2019-06-16: qty 6

## 2019-06-16 MED ORDER — UMECLIDINIUM BROMIDE 62.5 MCG/INH IN AEPB
1.0000 | INHALATION_SPRAY | Freq: Every day | RESPIRATORY_TRACT | Status: DC
  Administered 2019-06-16 – 2019-06-21 (×6): 1 via RESPIRATORY_TRACT
  Filled 2019-06-16: qty 7

## 2019-06-16 MED ORDER — DILTIAZEM HCL 60 MG PO TABS
60.0000 mg | ORAL_TABLET | Freq: Three times a day (TID) | ORAL | Status: DC
  Administered 2019-06-16 – 2019-06-17 (×4): 60 mg via ORAL
  Filled 2019-06-16: qty 1
  Filled 2019-06-16: qty 2
  Filled 2019-06-16 (×2): qty 1

## 2019-06-16 MED FILL — Diltiazem HCl IV Soln 125 MG/25ML (5 MG/ML): INTRAVENOUS | Qty: 25 | Status: AC

## 2019-06-16 MED FILL — Dextrose Inj 5%: INTRAVENOUS | Qty: 150 | Status: AC

## 2019-06-16 NOTE — ED Notes (Signed)
Attempted report to 300. Nurse in contact room, and charge nurse at lunch per Network engineer. Instructed by charge to take pt up to floor due to high pt volume in ED. 300 secretary notified that pt would be brought up without prior report.

## 2019-06-16 NOTE — TOC Initial Note (Signed)
Transition of Care [facility]) - Initial/Assessment Note    Patient Details  Name: [name] MRN: [id number] Date of Birth: April 06, 1946  Transition [facility]) CM/SW Contact:    [place], LCSW Phone Number: 06/16/2019, 12:54 PM  Clinical Narrative:      CSW at bedside to conduct TOC initial assessment. Pt reports that she currently lives at home alone. Patient goes into detail and explains that she receives much support from her sons who assist her with house hold needs such as chores and laundry. Pt adds that her son also assists with transportation to and from medial appointments.   Pt currently has the following DME in the home: walker, shower chair, nebulizer, cpap machine, and 02 2L.   Pt discharge is currently pending continued medical workup. TOC team will continue to follow patient for any discharge related needs  [facility]  Clinical Social Worker  Ph: [phone]         Expected Discharge Plan: [place] Barriers to Discharge: Continued Medical Work up   Patient Goals and CMS Choice Patient states their goals for this hospitalization and ongoing recovery are:: to return home      Expected Discharge Plan and Services Expected Discharge Plan: [place] In-house Referral: Clinical Social Work Discharge Planning Services: CM Consult   Living arrangements for the past 2 months: [place]                   DME Agency: [employer]     Representative spoke with at DME Agency: Blake Divine            Prior Living Arrangements/Services Living arrangements for the past 2 months: [place] Lives with:: Self Patient language and need for interpreter reviewed:: Yes Do you feel safe going back to the place where you live?: Yes      Need for Family Participation in Patient Care: Yes (Comment) Care giver support system in place?: Yes (comment) Current home services:  DME Criminal Activity/Legal Involvement Pertinent to Current Situation/Hospitalization: No - Comment as needed  Activities of Daily Living Home Assistive Devices/Equipment: Shower chair with back, Environmental consultant (specify type) ADL Screening (condition at time of admission) Patient's cognitive ability adequate to safely complete daily activities?: Yes Is the patient deaf or have difficulty hearing?: No Does the patient have difficulty seeing, even when wearing glasses/contacts?: No Does the patient have difficulty concentrating, remembering, or making decisions?: No Patient able to express need for assistance with ADLs?: Yes Does the patient have difficulty dressing or bathing?: No Independently performs ADLs?: Yes (appropriate for developmental age) Does the patient have difficulty walking or climbing stairs?: Yes Weakness of Legs: Both Weakness of Arms/Hands: None  Permission Sought/Granted Permission sought to share information with : Case Manager Permission granted to share information with : Yes, Verbal Permission Granted  Share Information with NAME: [name]  Permission granted to share info w AGENCY: [employer]  Permission granted to share info w Relationship: son  Permission granted to share info w Contact Information: [phone]  Emotional Assessment Appearance:: Appears stated age Attitude/Demeanor/Rapport: Engaged Affect (typically observed): Accepting Orientation: : Oriented to Self, Oriented to Place, Oriented to  Time, Oriented to Situation Alcohol / Substance Use: Not Applicable Psych Involvement: No (comment)  Admission diagnosis:  [place] Patient Active Problem List   Diagnosis Date Noted  . Atrial flutter with rapid ventricular response ([place]) 06/16/2019  .  Atrial fibrillation with RVR ([place]) 06/15/2019  . Dyspnea and respiratory abnormalities 06/01/2019  . Acute on chronic respiratory failure with hypoxia and hypercapnia with respiratory acidosis 06/01/2019  .  Anasarca 05/31/2019  . Nausea without vomiting 05/31/2019  . Generalized abdominal pain 05/31/2019  . History of colonic polyps 05/31/2019  . Lymphedema of both lower extremities 04/28/2019  . Chronic respiratory failure with hypoxia ([place]) 07/13/2018  . Acute renal failure superimposed on stage 3 chronic kidney disease ([place]) 07/13/2018  . Chronic diastolic CHF (congestive heart failure) ([place]) 07/13/2018  . COPD (chronic obstructive pulmonary disease) ([place]) 07/13/2018  . COPD exacerbation ([place]) 07/12/2018  . Flatulence 09/18/2017  . IBS (irritable bowel syndrome) 04/03/2017  . Gout 03/15/2017  . Acute on chronic diastolic CHF (congestive heart failure) ([place]) 05/08/2016  . CKD (chronic kidney disease) stage 3, GFR 30-59 ml/min 05/08/2016  . COPD with acute exacerbation ([place]) 05/08/2016  . Constipation 04/25/2016  . Peripheral vertigo 06/27/2015  . Port-A-Cath in place 10/06/2014  . Chronic obstructive pulmonary disease ([place]) 04/30/2014  . Obesity, Class III, BMI 40-49.9 (morbid obesity) ([place]) 04/30/2014  . Hemorrhoids, internal, with bleeding 11/10/2013  . Anemia 08/18/2013  . Acid reflux   . Obstructive chronic bronchitis without exacerbation ([place]) 12/26/2009  . Dysphagia 12/26/2009  . DM type 2 with diabetic peripheral neuropathy ([place]) 12/25/2009  . Vitamin B deficiency 12/25/2009  . Iron deficiency anemia due to chronic blood loss 12/25/2009  . Benign essential HTN 12/25/2009  . DEGENERATIVE DISC DISEASE 12/25/2009  . OSA on CPAP 12/25/2009  . HLD (hyperlipidemia) 12/25/2009  . Type 2 diabetes with nephropathy ([place]) 12/25/2009   PCP:  [name], MD Pharmacy:   [facility] - [address] [place] [address] [place] [address] Phone: [phone] Fax: [phone]  [facility] [phone] - [place], [place] [address] [place] [address] Phone: [phone]  Fax: [phone]     Social Determinants of Health (SDOH) Interventions    Readmission Risk Interventions Readmission Risk Prevention Plan 06/08/2019  Transportation Screening Complete  PCP or Specialist Appt within 3-5 Days Complete  HRI or [place] Not Complete  HRI or Home Care Consult comments patient declining home health  Social Work Consult for [place] Planning/Counseling [place] Not Applicable  Medication Review Press photographer) Complete  Some recent data might be hidden

## 2019-06-16 NOTE — ED Notes (Addendum)
Nurse unable to take report at this time on floor.

## 2019-06-16 NOTE — Progress Notes (Signed)
PROGRESS NOTE [name]   [name]  NGE:[id number]  DOB: Sep 01, 1945  DOA: 06/15/2019 PCP: Rita Mealy, MD   Brief Admission Hx: [name] is an 73 y.o. female with past medical history significant for hypertension, DM 2, CAD, COPD with chronic hypoxic respiratory failure, CKD 3, HFpEF who was recently discharged last week after treatment of acute decompensated heart failure with increased lower extremity edema and persistent anasarca now presents complaining of weakness.  She was found to be in atrial flutter with RVR which is newly diagnosed.    MDM/Assessment & Plan:   1. Atrial flutter with RVR - Pt was treated with IV diltiazem and now is off IV diltiazem. She has converted to NSR.  She has been started on oral diltiazem 60 mg TID with holding parameters per cardiologyservice.  She remains on toprol.  Holding hydralazine.  She is on apixaban 5 mg BID for full anticoagulation.  2. Acute on chronic diastolic HF - She is on IV lasix per cardiology service and remains on home oral metolazone.  3. OSA - nightly cpap while in hospital.  4. COPD with chronic respiratory failure - stable, following.   DVT prophylaxis: apixaban Code Status: full  Family Communication:  Disposition Plan: telemetry inpatient bed requested    Consultants:  Cardiology   Procedures:    Antimicrobials:     Subjective: Pt resting comfortably, no complaints.    Objective: Vitals:   06/16/19 0400 06/16/19 0430 06/16/19 0700 06/16/19 0924  BP: (!) 148/94 (!) 158/94 (!) 142/44   Pulse: 87 86 87 (!) 106  Resp: (!) 21 (!) 21 20 19   Temp:      TempSrc:      SpO2: 96% 90% 96% 90%    Intake/Output Summary (Last 24 hours) at 06/16/2019 1327 Last data filed at 06/16/2019 0857 Gross per 24 hour  Intake 212.62 ml  Output -  Net 212.62 ml   There were no vitals filed for this visit.   REVIEW OF SYSTEMS  As per history otherwise all reviewed and reported  negative  Exam:  General exam: awake, alert, NAD, cooperative.  Respiratory system: Clear. No increased work of breathing. Cardiovascular system: normal S1 & S2 heard. No JVD, murmurs, gallops, clicks or pedal edema. Gastrointestinal system: Abdomen is nondistended, soft and nontender. Normal bowel sounds heard. Central nervous system: Alert and oriented. No focal neurological deficits. Extremities: 1+ edema BLEs.  Data Reviewed: Basic Metabolic Panel: Recent Labs  Lab 06/15/19 1340 06/16/19 0730  NA 138 139  K 3.9 4.1  CL 97* 98  CO2 30 28  GLUCOSE 181* 166*  BUN 64* 63*  CREATININE 2.05* 1.98*  CALCIUM 8.2* 8.1*   Liver Function Tests: No results for input(s): AST, ALT, ALKPHOS, BILITOT, PROT, ALBUMIN in the last 168 hours. No results for input(s): LIPASE, AMYLASE in the last 168 hours. No results for input(s): AMMONIA in the last 168 hours. CBC: Recent Labs  Lab 06/15/19 1340 06/16/19 0730  WBC 10.7* 12.7*  HGB 11.2* 10.6*  HCT 36.8 35.6*  MCV 88.5 90.1  PLT 250 231   Cardiac Enzymes: No results for input(s): CKTOTAL, CKMB, CKMBINDEX, TROPONINI in the last 168 hours. CBG (last 3)  Recent Labs    06/16/19 0741 06/16/19 1235  GLUCAP 130* 127*   Recent Results (from the past 240 hour(s))  SARS CORONAVIRUS 2 (TAT 6-24 HRS) Nasopharyngeal Nasopharyngeal Swab     Status: None   Collection Time: 06/15/19  1:47  PM   Specimen: Nasopharyngeal Swab  Result Value Ref Range Status   SARS Coronavirus 2 NEGATIVE NEGATIVE Final    Comment: (NOTE) SARS-CoV-2 target nucleic acids are NOT DETECTED. The SARS-CoV-2 RNA is generally detectable in upper and lower respiratory specimens during the acute phase of infection. Negative results do not preclude SARS-CoV-2 infection, do not rule out co-infections with other pathogens, and should not be used as the sole basis for treatment or other patient management decisions. Negative results must be combined with clinical  observations, patient history, and epidemiological information. The expected result is Negative. Fact Sheet for Patients: SugarRoll.be Fact Sheet for Healthcare Providers: https://www.woods-mathews.com/ This test is not yet approved or cleared by the [place] FDA and  has been authorized for detection and/or diagnosis of SARS-CoV-2 by FDA under an Emergency Use Authorization (EUA). This EUA will remain  in effect (meaning this test can be used) for the duration of the COVID-19 declaration under Section 56 4(b)(1) of the Act, 21 U.S.C. section 360bbb-3(b)(1), unless the authorization is terminated or revoked sooner. Performed at [facility], [place] [address], [place], [place] [address]      Studies: Dg Chest Portable 1 View  Result Date: 06/15/2019 CLINICAL DATA:  Dyspnea EXAM: PORTABLE CHEST 1 VIEW COMPARISON:  05/31/2019, 05/08/2016, 07/12/2018 FINDINGS: Right-sided central venous port tip over the SVC. Retrocardiac opacity and lucency compatible with large hiatal hernia. Scarring at the bases. Stable cardiomediastinal silhouette. IMPRESSION: No active disease. Similar appearance of large hiatal hernia, mild cardiomegaly, and basilar scarring Electronically Signed   By: Donavan Foil M.D.   On: 06/15/2019 14:25     Scheduled Meds: . allopurinol  100 mg Oral BID  . amitriptyline  100 mg Oral QHS  . apixaban  5 mg Oral BID  . atorvastatin  40 mg Oral QHS  . diltiazem  60 mg Oral Q8H  . fluticasone furoate-vilanterol  1 puff Inhalation Daily  . furosemide  80 mg Intravenous BID  . guaiFENesin  600 mg Oral BID  . insulin aspart  0-20 Units Subcutaneous TID WC  . insulin glargine  12 Units Subcutaneous QHS  . metolazone  2.5 mg Oral Q M,W,F  . metoprolol succinate  75 mg Oral q morning - 10a  . pantoprazole  40 mg Oral Daily  . umeclidinium bromide  1 puff Inhalation Daily   Continuous Infusions:  Principal Problem:   Atrial  fibrillation with RVR (HCC) Active Problems:   DM type 2 with diabetic peripheral neuropathy (HCC)   Benign essential HTN   OSA on CPAP   Acid reflux   CKD (chronic kidney disease) stage 3, GFR 30-59 ml/min   Chronic obstructive pulmonary disease (HCC)   Chronic respiratory failure with hypoxia (HCC)   Chronic diastolic CHF (congestive heart failure) (HCC)   Anasarca   Atrial flutter with rapid ventricular response ([place])  Time spent:   Irwin Brakeman, MD [facility] 06/16/2019, 1:27 PM    LOS: 1 day  How to contact the [facility] Attending or Consulting provider 7A - 7P or covering provider during after hours [place], for this patient?  1. Check the care team in [facility] and look for a) attending/consulting TRH provider listed and b) the [facility] team listed 2. Log into [email or web address] and use [place]'s universal password to access. If you do not have the password, please contact the hospital operator. 3. Locate the [facility] provider you are looking for under [facility] and page to a number that  you can be directly reached. 4. If you still have difficulty reaching the provider, please page the [facility] (Director on Call) for the Hospitalists listed on amion for assistance.

## 2019-06-16 NOTE — Progress Notes (Signed)
*  PRELIMINARY RESULTS* Echocardiogram 2D Echocardiogram has been performed.  [name] 06/16/2019, 2:05 PM

## 2019-06-16 NOTE — Progress Notes (Addendum)
Progress Note  Patient Name: [name] Date of Encounter: 06/16/2019  Primary Cardiologist: [name], MD   Subjective   Some SOB this AM  Inpatient Medications    Scheduled Meds: . allopurinol  100 mg Oral BID  . amitriptyline  100 mg Oral QHS  . apixaban  5 mg Oral BID  . aspirin EC  81 mg Oral Daily  . atorvastatin  40 mg Oral QHS  . fluticasone furoate-vilanterol  1 puff Inhalation Daily  . furosemide  80 mg Intravenous BID  . guaiFENesin  600 mg Oral BID  . hydrALAZINE  25 mg Oral BID  . insulin aspart  0-20 Units Subcutaneous TID WC  . insulin glargine  12 Units Subcutaneous QHS  . metolazone  2.5 mg Oral Q M,W,F  . metoprolol succinate  75 mg Oral q morning - 10a  . pantoprazole  40 mg Oral Daily  . umeclidinium bromide  1 puff Inhalation Daily   Continuous Infusions: . diltiazem (CARDIZEM) infusion 10 mg/hr (06/16/19 0623)   PRN Meds: acetaminophen, famotidine, loperamide, metoprolol tartrate, ondansetron (ZOFRAN) IV   Vital Signs    Vitals:   06/16/19 0230 06/16/19 0400 06/16/19 0430 06/16/19 0700  BP: (!) 146/79 (!) 148/94 (!) 158/94 (!) 142/44  Pulse: 85 87 86 87  Resp: 18 (!) 21 (!) 21 20  Temp:      TempSrc:      SpO2: 98% 96% 90% 96%    Intake/Output Summary (Last 24 hours) at 06/16/2019 0824 Last data filed at 06/16/2019 8341 Gross per 24 hour  Intake 187.76 ml  Output -  Net 187.76 ml   Last 3 Weights 06/08/2019 06/07/2019 06/06/2019  Weight (lbs) (No Data) 246 lb 11.1 oz 244 lb 11.4 oz  Weight (kg) (No Data) 111.9 kg 111 kg      Telemetry    SR - Personally Reviewed  ECG    n/a - Personally Reviewed  Physical Exam   GEN: No acute distress.   Neck: No JVD Cardiac: RRR, no murmurs, rubs, or gallops.  Respiratory: Clear to auscultation bilaterally. GI: Soft, nontender, non-distended  MS: 2+ bilateral LE edema; No deformity. Neuro:  Nonfocal  Psych: Normal affect   Labs    High Sensitivity Troponin:    Recent Labs  Lab 06/15/19 1344 06/15/19 1714  TROPONINIHS 25* 28*      Chemistry Recent Labs  Lab 06/15/19 1340 06/16/19 0730  NA 138 139  K 3.9 4.1  CL 97* 98  CO2 30 28  GLUCOSE 181* 166*  BUN 64* 63*  CREATININE 2.05* 1.98*  CALCIUM 8.2* 8.1*  GFRNONAA 23* 24*  GFRAA 27* 28*  ANIONGAP 11 13     Hematology Recent Labs  Lab 06/15/19 1340 06/16/19 0730  WBC 10.7* 12.7*  RBC 4.16 3.95  HGB 11.2* 10.6*  HCT 36.8 35.6*  MCV 88.5 90.1  MCH 26.9 26.8  MCHC 30.4 29.8*  RDW 14.8 14.7  PLT 250 231    BNP Recent Labs  Lab 06/15/19 1347  BNP 327.0*     DDimer No results for input(s): DDIMER in the last 168 hours.   Radiology    Dg Chest Portable 1 View  Result Date: 06/15/2019 CLINICAL DATA:  Dyspnea EXAM: PORTABLE CHEST 1 VIEW COMPARISON:  05/31/2019, 05/08/2016, 07/12/2018 FINDINGS: Right-sided central venous port tip over the SVC. Retrocardiac opacity and lucency compatible with large hiatal hernia. Scarring at the bases. Stable cardiomediastinal silhouette. IMPRESSION: No active disease. Similar appearance of  large hiatal hernia, mild cardiomegaly, and basilar scarring Electronically Signed   By: Donavan Foil M.D.   On: 06/15/2019 14:25    Cardiac Studies    Patient Profile     [name] is a 73 y.o. female with a hx of severe COPD who is being seen today for the evaluation of aflutter with RVR at the request of Dr Regenia Skeeter.  Assessment & Plan  1. Aflutter with RVR - new diagnosis this admission In ER bolused 15mg  of dilt, then started on dilt gtt. Dilt gtt up to 15mg /hr. Rates remained elevated, given IV lopressor 5mg  in ER - some technical issues with her telemetry but appears to be back in SR, will obtain 12 lead. D/c dilt gtt, start dilt oral 60mg  tid with hold parameters, continue home toprol but would not titrate given her COPD history. Hold hydralazine to give room with bp for these changes.  - last echo 2017, repeat echo insetting of  new arrhythmia  CHADS2Vasc score is (CHF, HTN age x1, gender, CAD) 5. Started eliquis 5mg  bid  2. Acute on chronic diastolic HF - difficult assessment of volume status given body habitus and chronic lymphedema - on IV lasix 80mg  bid, received just one dose yesterday. I/Os are incomplete. Down trend in Cr suggesting some venous congestion and CHF, continue IV lasix today. She remains on her home metolazone 2.5mg  on MWF   3. COPD with chronic resp failure  4. OSA  Would monitor rates one more day. Off dilt gtt, would be ok for a tele bed    For questions or updates, please contact [place] Please consult [email or web address] for contact info under        Signed, Carlyle Dolly, MD  06/16/2019, 8:24 AM

## 2019-06-17 DIAGNOSIS — I4891 Unspecified atrial fibrillation: Secondary | ICD-10-CM | POA: Diagnosis present

## 2019-06-17 DIAGNOSIS — Z9989 Dependence on other enabling machines and devices: Secondary | ICD-10-CM

## 2019-06-17 DIAGNOSIS — G4733 Obstructive sleep apnea (adult) (pediatric): Secondary | ICD-10-CM

## 2019-06-17 LAB — GLUCOSE, CAPILLARY
Glucose-Capillary: 113 mg/dL — ABNORMAL HIGH (ref 70–99)
Glucose-Capillary: 173 mg/dL — ABNORMAL HIGH (ref 70–99)
Glucose-Capillary: 103 mg/dL — ABNORMAL HIGH (ref 70–99)
Glucose-Capillary: 137 mg/dL — ABNORMAL HIGH (ref 70–99)

## 2019-06-17 LAB — BASIC METABOLIC PANEL
Anion gap: 14 (ref 5–15)
BUN: 65 mg/dL — ABNORMAL HIGH (ref 8–23)
CO2: 31 mmol/L (ref 22–32)
Calcium: 8.1 mg/dL — ABNORMAL LOW (ref 8.9–10.3)
Chloride: 96 mmol/L — ABNORMAL LOW (ref 98–111)
Creatinine, Ser: 2.63 mg/dL — ABNORMAL HIGH (ref 0.44–1.00)
GFR calc Af Amer: 20 mL/min — ABNORMAL LOW (ref >60)
GFR calc non Af Amer: 17 mL/min — ABNORMAL LOW (ref >60)
Glucose, Bld: 122 mg/dL — ABNORMAL HIGH (ref 70–99)
Potassium: 4.1 mmol/L (ref 3.5–5.1)
Sodium: 141 mmol/L (ref 135–145)

## 2019-06-17 LAB — MAGNESIUM: Magnesium: 1.7 mg/dL (ref 1.7–2.4)

## 2019-06-17 MED ORDER — DILTIAZEM HCL ER COATED BEADS 120 MG PO CP24
120.0000 mg | ORAL_CAPSULE | Freq: Every day | ORAL | Status: DC
  Administered 2019-06-17 – 2019-06-21 (×5): 120 mg via ORAL
  Filled 2019-06-17 (×5): qty 1

## 2019-06-17 NOTE — Progress Notes (Signed)
PROGRESS NOTE [facility]   RAUSHANAH OSMUNDSON  UMP:[id number]  DOB: 1946/07/15  DOA: 06/15/2019 PCP: [name], MD   Brief Admission Hx: [name] is an 73 y.o. female with past medical history significant for hypertension, DM 2, CAD, COPD with chronic hypoxic respiratory failure, CKD 3, HFpEF who was recently discharged last week after treatment of acute decompensated heart failure with increased lower extremity edema and persistent anasarca now presents complaining of weakness.  She was found to be in atrial flutter with RVR which is newly diagnosed.    MDM/Assessment & Plan:   1. Atrial flutter with RVR - Pt was treated with IV diltiazem and now is off IV diltiazem. She has converted to NSR.  She has been started on oral diltiazem per cardiology service.  She remains on toprol.  Holding hydralazine.  She is on apixaban 5 mg BID for full anticoagulation.  2. Acute on chronic diastolic HF - She is on IV lasix per cardiology service and remains on home oral metolazone. Her I/Os are not accurately recorded so it is difficult to assess her response to diuresis.   3. OSA - nightly cpap while in hospital.  4. COPD with chronic respiratory failure - stable, following. 5. Stage 3b CKD - creatinine being followed with diuresis.   DVT prophylaxis: apixaban Code Status: full  Family Communication:  Disposition Plan: telemetry inpatient bed requested, anticipating may need SNF placement at DC   Consultants:  Cardiology   Procedures:    Antimicrobials:     Subjective: Pt resting comfortably, no complaints.    Objective: Vitals:   06/17/19 0527 06/17/19 0932 06/17/19 1052 06/17/19 1344  BP: (!) 113/49  (!) 116/47 (!) 107/45  Pulse: 73     Resp:      Temp:      TempSrc:      SpO2: 95% 96%    Weight: 106.2 kg       Intake/Output Summary (Last 24 hours) at 06/17/2019 1403 Last data filed at 06/17/2019 4008 Gross per 24 hour  Intake 360 ml  Output  150 ml  Net 210 ml   Filed Weights   06/17/19 0527  Weight: 106.2 kg   REVIEW OF SYSTEMS  As per history otherwise all reviewed and reported negative  Exam:  General exam: awake, alert, NAD, cooperative.  Respiratory system: Clear. No increased work of breathing. Cardiovascular system: normal S1 & S2 heard. No JVD, murmurs, gallops, clicks or pedal edema. Gastrointestinal system: Abdomen is nondistended, soft and nontender. Normal bowel sounds heard. Central nervous system: Alert and oriented. No focal neurological deficits. Extremities: 1+ edema BLEs.  Data Reviewed: Basic Metabolic Panel: Recent Labs  Lab 06/15/19 1340 06/16/19 0730 06/17/19 0916  NA 138 139 141  K 3.9 4.1 4.1  CL 97* 98 96*  CO2 30 28 31   GLUCOSE 181* 166* 122*  BUN 64* 63* 65*  CREATININE 2.05* 1.98* 2.63*  CALCIUM 8.2* 8.1* 8.1*  MG  --   --  1.7   Liver Function Tests: No results for input(s): AST, ALT, ALKPHOS, BILITOT, PROT, ALBUMIN in the last 168 hours. No results for input(s): LIPASE, AMYLASE in the last 168 hours. No results for input(s): AMMONIA in the last 168 hours. CBC: Recent Labs  Lab 06/15/19 1340 06/16/19 0730  WBC 10.7* 12.7*  HGB 11.2* 10.6*  HCT 36.8 35.6*  MCV 88.5 90.1  PLT 250 231   Cardiac Enzymes: No results for input(s): CKTOTAL, CKMB, CKMBINDEX, TROPONINI  in the last 168 hours. CBG (last 3)  Recent Labs    06/16/19 2113 06/17/19 0710 06/17/19 1125  GLUCAP 153* 113* 173*   Recent Results (from the past 240 hour(s))  SARS CORONAVIRUS 2 (TAT 6-24 HRS) Nasopharyngeal Nasopharyngeal Swab     Status: None   Collection Time: 06/15/19  1:47 PM   Specimen: Nasopharyngeal Swab  Result Value Ref Range Status   SARS Coronavirus 2 NEGATIVE NEGATIVE Final    Comment: (NOTE) SARS-CoV-2 target nucleic acids are NOT DETECTED. The SARS-CoV-2 RNA is generally detectable in upper and lower respiratory specimens during the acute phase of infection. Negative results do  not preclude SARS-CoV-2 infection, do not rule out co-infections with other pathogens, and should not be used as the sole basis for treatment or other patient management decisions. Negative results must be combined with clinical observations, patient history, and epidemiological information. The expected result is Negative. Fact Sheet for Patients: SugarRoll.be Fact Sheet for Healthcare Providers: https://www.woods-mathews.com/ This test is not yet approved or cleared by the [place] FDA and  has been authorized for detection and/or diagnosis of SARS-CoV-2 by FDA under an Emergency Use Authorization (EUA). This EUA will remain  in effect (meaning this test can be used) for the duration of the COVID-19 declaration under Section 56 4(b)(1) of the Act, 21 U.S.C. section 360bbb-3(b)(1), unless the authorization is terminated or revoked sooner. Performed at [facility], [place] [address] [place],  [address]      Studies: Dg Chest Portable 1 View  Result Date: 06/15/2019 CLINICAL DATA:  Dyspnea EXAM: PORTABLE CHEST 1 VIEW COMPARISON:  05/31/2019, 05/08/2016, 07/12/2018 FINDINGS: Right-sided central venous port tip over the SVC. Retrocardiac opacity and lucency compatible with large hiatal hernia. Scarring at the bases. Stable cardiomediastinal silhouette. IMPRESSION: No active disease. Similar appearance of large hiatal hernia, mild cardiomegaly, and basilar scarring Electronically Signed   By: Donavan Foil M.D.   On: 06/15/2019 14:25   Scheduled Meds: . allopurinol  100 mg Oral BID  . amitriptyline  100 mg Oral QHS  . apixaban  5 mg Oral BID  . atorvastatin  40 mg Oral QHS  . diltiazem  120 mg Oral Daily  . fluticasone furoate-vilanterol  1 puff Inhalation Daily  . furosemide  80 mg Intravenous BID  . guaiFENesin  600 mg Oral BID  . insulin aspart  0-20 Units Subcutaneous TID WC  . insulin glargine  12 Units Subcutaneous QHS   . metolazone  2.5 mg Oral Q M,W,F  . metoprolol succinate  75 mg Oral q morning - 10a  . pantoprazole  40 mg Oral Daily  . umeclidinium bromide  1 puff Inhalation Daily   Continuous Infusions:  Principal Problem:   Atrial flutter with rapid ventricular response (HCC) Active Problems:   DM type 2 with diabetic peripheral neuropathy (HCC)   Benign essential HTN   OSA on CPAP   Acid reflux   CKD (chronic kidney disease) stage 3, GFR 30-59 ml/min   Chronic obstructive pulmonary disease (HCC)   Chronic respiratory failure with hypoxia (HCC)   Chronic diastolic CHF (congestive heart failure) ([place])   Anasarca  Time spent:   Irwin Brakeman, MD [facility] 06/17/2019, 2:03 PM    LOS: 2 days  How to contact the [facility] Attending or Consulting provider [place] or covering provider during after hours [place], for this patient?  1. Check the care team in [facility] and look for a) attending/consulting TRH provider listed  and b) the TRH team listed 2. Log into [email or web address] and use [place]'s universal password to access. If you do not have the password, please contact the hospital operator. 3. Locate the TRH provider you are looking for under [facility] and [place] to a number that you can be directly reached. 4. If you still have difficulty reaching the provider, please [place] the DOC (Director on Call) for the Hospitalists listed on amion for assistance.  

## 2019-06-17 NOTE — TOC Progression Note (Signed)
Transition of Care [facility]) - Progression Note    Patient Details  Name: [name] MRN: [id number] Date of Birth: 1945-09-29  Transition of [facility]) CM/SW Contact  Sahaana Weitman, [name], RN Phone Number: 06/17/2019, 1:18 PM  Clinical Narrative:  Patient recommended for SNF. She is reluctant to go but is allowing me to send out referral in case her strength/mobility isn't improved by time of DC. Referrals sent, will discuss bed offers with patient when available.     Expected Discharge Plan: [place] Barriers to Discharge: Continued Medical Work up  Expected Discharge Plan and Services Expected Discharge Plan: [place] In-house Referral: Clinical Social Work Discharge Planning Services: CM Consult   Living arrangements for the past 2 months: [place]                   DME Agency: [employer]     Representative spoke with at DME Agency: [place] ([place]) Interventions    Readmission Risk Interventions Readmission Risk Prevention Plan 06/08/2019  Transportation Screening Complete  PCP or Specialist Appt within 3-5 Days Complete  HRI or [place] Not Complete  HRI or Home Care Consult comments patient declining home health  Social Work Consult for [place] Planning/Counseling [place] Not Applicable  Medication Review Press photographer) Complete  Some recent data might be hidden

## 2019-06-17 NOTE — Plan of Care (Signed)
  Problem: Acute Rehab PT Goals(only PT should resolve) Goal: Pt Will Go Supine/Side To Sit Outcome: Progressing [place]she[place] (Taken 06/17/2019 1219) Pt will go Supine/Side to Sit: with supervision Goal: Patient Will Transfer Sit To/From Stand Outcome: Progressing [place] (Taken 06/17/2019 1219) Patient will transfer sit to/from stand: with minimal assist Goal: Pt Will Transfer Bed To Chair/Chair To Bed Outcome: Progressing Flowsheets (Taken 06/17/2019 1219) Pt will Transfer Bed to Chair/Chair to Bed: with min assist Goal: Pt Will Ambulate Outcome: Progressing Flowsheets (Taken 06/17/2019 1219) Pt will Ambulate:  50 feet  with minimal assist  with rolling walker   12:20 PM, 06/17/19 Lonell Grandchild, MPT Physical Therapist with [facility] 336 [phone] office [phone] mobile phone

## 2019-06-17 NOTE — Evaluation (Signed)
Physical Therapy Evaluation Patient Details Name: [name] MRN: [id number] DOB: [birth year] Today's Date: 06/17/2019   History of Present Illness  [name] is an 73 y.o. female with past medical history significant for hypertension, DM 2, CAD, COPD with chronic hypoxic respiratory failure, CKD 3, HFpEF who was recently discharged last week after treatment of acute decompensated heart failure with increased lower extremity edema and persistent anasarca now presents complaining of weakness.  Patient states she was doing per usual until about 2 days ago when she started feeling very very tired.  She also noted that she was a little bit more short of breath than usual.  She is not sure if she had increased lower extremity edema.  She felt that she had some soreness around her chest like someone had hit her.  No association with exertion.  No associated diaphoresis or nausea.  It did not come and go but has been constant over the past 2 days.    Clinical Impression  Patient limited for functional mobility as stated below secondary to BLE weakness, fatigue and poor standing balance.  Patient unable to ambulate away from bed side due to fall risk and tolerated sitting up in chair after therapy.  Patient will benefit from continued physical therapy in hospital and recommended venue below to increase strength, balance, endurance for safe ADLs and gait.     Follow Up Recommendations SNF;Supervision for mobility/OOB;Supervision - Intermittent    Equipment Recommendations  None recommended by PT    Recommendations for Other Services       Precautions / Restrictions Precautions Precautions: Fall Restrictions Weight Bearing Restrictions: No      Mobility  Bed Mobility Overal bed mobility: Needs Assistance Bed Mobility: Supine to Sit     Supine to sit: Min guard;Min assist     General bed mobility comments: head of bed raised slightly, use of bed rail, slow labored  movement  Transfers Overall transfer level: Needs assistance Equipment used: Rolling walker (2 wheeled) Transfers: Sit to/from [facility] Sit to Stand: Mod assist Stand pivot transfers: Mod assist       General transfer comment: slow labored movement, difficulty for sit to stands due to BLE weakness, incontinent of urine when standing  Ambulation/Gait Ambulation/Gait assistance: Mod assist Gait Distance (Feet): 4 Feet Assistive device: Rolling walker (2 wheeled) Gait Pattern/deviations: Decreased step length - right;Decreased step length - left;Decreased stride length Gait velocity: slow   General Gait Details: limited to 4-5 slow labored side steps due to BLE weakness and fatigue  Stairs            Wheelchair Mobility    Modified Rankin (Stroke Patients Only)       Balance Overall balance assessment: Needs assistance Sitting-balance support: Feet supported;No upper extremity supported Sitting balance-Leahy Scale: Fair Sitting balance - Comments: fair/good seated EOB   Standing balance support: During functional activity;Bilateral upper extremity supported Standing balance-Leahy Scale: Poor Standing balance comment: fair/poor using RW                             Pertinent Vitals/Pain Pain Assessment: 0-10 Pain Score: 6  Pain Location: chronic low back pain Pain Descriptors / Indicators: Sore;Aching Pain Intervention(s): Limited activity within patient's tolerance;Monitored during session    Home Living Family/patient expects to be discharged to:: Private residence Living Arrangements: Alone Available Help at Discharge: Family;Available 24 hours/day Type of Home: House Home Access: Level entry  Home Layout: One level Home Equipment: Walker - 2 wheels;Shower seat;Bedside commode;Cane - single point      Prior Function Level of Independence: Independent with assistive device(s)         Comments: household and short  distanced community ambulator with RW, home O2 at 2 LPM PRN and at night with BIPAP     Hand Dominance   Dominant Hand: Right    Extremity/Trunk Assessment   Upper Extremity Assessment Upper Extremity Assessment: Generalized weakness    Lower Extremity Assessment Lower Extremity Assessment: Generalized weakness    Cervical / Trunk Assessment Cervical / Trunk Assessment: Normal  Communication   Communication: No difficulties  Cognition Arousal/Alertness: Awake/alert Behavior During Therapy: WFL for tasks assessed/performed Overall Cognitive Status: Within Functional Limits for tasks assessed                                        General Comments      Exercises     Assessment/Plan    PT Assessment Patient needs continued PT services  PT Problem List Decreased strength;Decreased activity tolerance;Decreased balance;Decreased mobility       PT Treatment Interventions Gait training;Stair training;Functional mobility training;Therapeutic activities;Therapeutic exercise;Patient/family education    PT Goals (Current goals can be found in the Care Plan section)  Acute Rehab PT Goals Patient Stated Goal: return home after rehab PT Goal Formulation: With patient Time For Goal Achievement: 07/01/19 Potential to Achieve Goals: Good    Frequency Min 3X/week   Barriers to discharge        Co-evaluation               AM-PAC PT "6 Clicks" Mobility  Outcome Measure Help needed turning from your back to your side while in a flat bed without using bedrails?: None Help needed moving from lying on your back to sitting on the side of a flat bed without using bedrails?: A Little Help needed moving to and from a bed to a chair (including a wheelchair)?: A Lot Help needed standing up from a chair using your arms (e.g., wheelchair or bedside chair)?: A Lot Help needed to walk in hospital room?: A Lot Help needed climbing 3-5 steps with a railing? : Total 6  Click Score: 14    End of Session Equipment Utilized During Treatment: Gait belt;Oxygen Activity Tolerance: Patient tolerated treatment well;Patient limited by fatigue Patient left: in chair;with bed alarm set Nurse Communication: Mobility status PT Visit Diagnosis: Unsteadiness on feet (R26.81);Other abnormalities of gait and mobility (R26.89);Muscle weakness (generalized) (M62.81)    Time: 9390-3009 PT Time Calculation (min) (ACUTE ONLY): 28 min   Charges:   PT Evaluation $PT Eval Moderate Complexity: 1 Mod PT Treatments $Therapeutic Activity: 23-37 mins        12:18 PM, 06/17/19 Lonell Grandchild, MPT Physical Therapist with [facility] [phone] office [phone] mobile phone

## 2019-06-17 NOTE — NC FL2 (Signed)
[place] LEVEL OF CARE SCREENING TOOL     IDENTIFICATION  Patient Name: [name] Birthdate: Oct 07, 1945 Sex: female Admission Date (Current Location): 06/15/2019  [facility] and [place] Number:  [employer] and Address:  [place] [address], [place]      Provider Number: [id number]  Attending Physician Name and Address:  Murlean Iba, MD  Relative Name and Phone Number:       Current Level of Care: Hospital Recommended Level of Care: [place] Prior Approval Number:    Date Approved/Denied:   PASRR Number: [id number]  Discharge Plan: SNF    Current Diagnoses: Patient Active Problem List   Diagnosis Date Noted  . Atrial flutter with rapid ventricular response ([place]) 06/16/2019  . Dyspnea and respiratory abnormalities 06/01/2019  . Acute on chronic respiratory failure with hypoxia and hypercapnia with respiratory acidosis 06/01/2019  . Anasarca 05/31/2019  . Nausea without vomiting 05/31/2019  . Generalized abdominal pain 05/31/2019  . History of colonic polyps 05/31/2019  . Lymphedema of both lower extremities 04/28/2019  . Chronic respiratory failure with hypoxia ([place]) 07/13/2018  . Acute renal failure superimposed on stage 3 chronic kidney disease ([place]) 07/13/2018  . Chronic diastolic CHF (congestive heart failure) ([place]) 07/13/2018  . COPD (chronic obstructive pulmonary disease) ([place]) 07/13/2018  . COPD exacerbation ([place]) 07/12/2018  . Flatulence 09/18/2017  . IBS (irritable bowel syndrome) 04/03/2017  . Gout 03/15/2017  . Acute on chronic diastolic CHF (congestive heart failure) ([place]) 05/08/2016  . CKD (chronic kidney disease) stage 3, GFR 30-59 ml/min 05/08/2016  . COPD with acute exacerbation ([place]) 05/08/2016  . Constipation 04/25/2016  . Peripheral vertigo 06/27/2015  . Port-A-Cath in place 10/06/2014  . Chronic obstructive pulmonary disease ([place]) 04/30/2014  . Obesity,  Class III, BMI 40-49.9 (morbid obesity) ([place]) 04/30/2014  . Hemorrhoids, internal, with bleeding 11/10/2013  . Anemia 08/18/2013  . Acid reflux   . Obstructive chronic bronchitis without exacerbation ([place]) 12/26/2009  . Dysphagia 12/26/2009  . DM type 2 with diabetic peripheral neuropathy ([place]) 12/25/2009  . Vitamin B deficiency 12/25/2009  . Iron deficiency anemia due to chronic blood loss 12/25/2009  . Benign essential HTN 12/25/2009  . DEGENERATIVE DISC DISEASE 12/25/2009  . OSA on CPAP 12/25/2009  . HLD (hyperlipidemia) 12/25/2009  . Type 2 diabetes with nephropathy ([place]) 12/25/2009    Orientation RESPIRATION BLADDER Height & Weight     Self, Time, Situation  O2 Continent Weight: 106.2 kg Height:     BEHAVIORAL SYMPTOMS/MOOD NEUROLOGICAL BOWEL NUTRITION STATUS      Continent Diet  AMBULATORY STATUS COMMUNICATION OF NEEDS Skin   Extensive Assist Verbally Normal                       Personal Care Assistance Level of Assistance  Bathing, Feeding, Dressing Bathing Assistance: Independent Feeding assistance: Independent Dressing Assistance: Independent     Functional Limitations Info  Sight, Hearing, Speech Sight Info: Adequate Hearing Info: Adequate Speech Info: Adequate    SPECIAL CARE FACTORS FREQUENCY  PT (By licensed PT)     PT Frequency: 5X/WEEK              Contractures Contractures Info: Not present    Additional Factors Info  Code Status, Allergies, Psychotropic Code Status Info: Full Allergies Info: NKA Psychotropic Info: Elavil         Current Medications (06/17/2019):  This is the current hospital active medication list  Current Facility-Administered Medications  Medication Dose Route Frequency Provider Last Rate Last Dose  . acetaminophen (TYLENOL) tablet 650 mg  650 mg Oral Q4H PRN Vashti Hey, MD   650 mg at 06/16/19 1509  . allopurinol (ZYLOPRIM) tablet 100 mg  100 mg Oral BID Bonnell Public Tublu, MD   100 mg at  06/17/19 1052  . amitriptyline (ELAVIL) tablet 100 mg  100 mg Oral QHS Bonnell Public Tublu, MD   100 mg at 06/16/19 2234  . apixaban (ELIQUIS) tablet 5 mg  5 mg Oral BID Bonnell Public Tublu, MD   5 mg at 06/17/19 1052  . atorvastatin (LIPITOR) tablet 40 mg  40 mg Oral QHS Bonnell Public Tublu, MD   40 mg at 06/16/19 2233  . diltiazem (CARDIZEM CD) 24 hr capsule 120 mg  120 mg Oral Daily Arnoldo Lenis, MD      . famotidine (PEPCID) tablet 40 mg  40 mg Oral QHS PRN Bonnell Public Tublu, MD      . fluticasone furoate-vilanterol (BREO ELLIPTA) 100-25 MCG/INH 1 puff  1 puff Inhalation Daily Bonnell Public Tublu, MD   1 puff at 06/17/19 0931  . furosemide (LASIX) injection 80 mg  80 mg Intravenous BID Bonnell Public Tublu, MD   80 mg at 06/17/19 4627  . guaiFENesin (MUCINEX) 12 hr tablet 600 mg  600 mg Oral BID Bonnell Public Tublu, MD   600 mg at 06/17/19 1052  . insulin aspart (novoLOG) injection 0-20 Units  0-20 Units Subcutaneous TID WC Vashti Hey, MD   3 Units at 06/16/19 1247  . insulin glargine (LANTUS) injection 12 Units  12 Units Subcutaneous QHS Vashti Hey, MD   12 Units at 06/16/19 2234  . loperamide (IMODIUM) capsule 2 mg  2 mg Oral PRN Bonnell Public Tublu, MD      . metolazone (ZAROXOLYN) tablet 2.5 mg  2.5 mg Oral Q M,W,F Bonnell Public Tublu, MD   2.5 mg at 06/16/19 0923  . metoprolol succinate (TOPROL-XL) 24 hr tablet 75 mg  75 mg Oral q morning - 10a Vashti Hey, MD   75 mg at 06/17/19 1052  . metoprolol tartrate (LOPRESSOR) injection 5 mg  5 mg Intravenous Q5 min PRN Vashti Hey, MD   5 mg at 06/15/19 1558  . ondansetron (ZOFRAN) injection 4 mg  4 mg Intravenous Q6H PRN Bonnell Public Tublu, MD      . pantoprazole (PROTONIX) EC tablet 40 mg  40 mg Oral Daily Bonnell Public Tublu, MD   40 mg at 06/17/19 1052  . umeclidinium bromide (INCRUSE ELLIPTA) 62.5 MCG/INH 1 puff   1 puff Inhalation Daily Vashti Hey, MD   1 puff at 06/17/19 0931     Discharge Medications: Please see discharge summary for a list of discharge medications.  Relevant Imaging Results:  Relevant Lab Results:   Additional Information SSN [id number] [place]  Caesar Mannella, [name], RN

## 2019-06-17 NOTE — Progress Notes (Signed)
Progress Note  Patient Name: [name] Date of Encounter: 06/17/2019  Primary Cardiologist: [name], MD   Subjective   Some ongoign SOB  Inpatient Medications    Scheduled Meds: . allopurinol  100 mg Oral BID  . amitriptyline  100 mg Oral QHS  . apixaban  5 mg Oral BID  . atorvastatin  40 mg Oral QHS  . diltiazem  60 mg Oral Q8H  . fluticasone furoate-vilanterol  1 puff Inhalation Daily  . furosemide  80 mg Intravenous BID  . guaiFENesin  600 mg Oral BID  . insulin aspart  0-20 Units Subcutaneous TID WC  . insulin glargine  12 Units Subcutaneous QHS  . metolazone  2.5 mg Oral Q M,W,F  . metoprolol succinate  75 mg Oral q morning - 10a  . pantoprazole  40 mg Oral Daily  . umeclidinium bromide  1 puff Inhalation Daily   Continuous Infusions:  PRN Meds: acetaminophen, famotidine, loperamide, metoprolol tartrate, ondansetron (ZOFRAN) IV   Vital Signs    Vitals:   06/16/19 2005 06/16/19 2112 06/17/19 0509 06/17/19 0527  BP:  (!) 107/46 (!) 103/46 (!) 113/49  Pulse:  70 73 73  Resp:  18 18   Temp:  99.1 F (37.3 C) 98.1 F (36.7 C)   TempSrc:  Oral Oral   SpO2: 90% 92% (!) 84% 95%  Weight:    106.2 kg    Intake/Output Summary (Last 24 hours) at 06/17/2019 0821 Last data filed at 06/17/2019 1829 Gross per 24 hour  Intake 384.86 ml  Output 150 ml  Net 234.86 ml   Last 3 Weights 06/17/2019 06/08/2019 06/07/2019  Weight (lbs) 234 lb 2.1 oz (No Data) 246 lb 11.1 oz  Weight (kg) 106.2 kg (No Data) 111.9 kg      Telemetry    SR - Personally Reviewed  ECG    n/a - Personally Reviewed  Physical Exam   GEN: No acute distress.   Neck: No JVD Cardiac: RRR, no murmurs, rubs, or gallops.  Respiratory: Clear to auscultation bilaterally. GI: Soft, nontender, non-distended  MS: 1+ bilateral LE edema; No deformity. Neuro:  Nonfocal  Psych: Normal affect   Labs    High Sensitivity Troponin:   Recent Labs  Lab 06/15/19 1344 06/15/19 1714   TROPONINIHS 25* 28*      Chemistry Recent Labs  Lab 06/15/19 1340 06/16/19 0730  NA 138 139  K 3.9 4.1  CL 97* 98  CO2 30 28  GLUCOSE 181* 166*  BUN 64* 63*  CREATININE 2.05* 1.98*  CALCIUM 8.2* 8.1*  GFRNONAA 23* 24*  GFRAA 27* 28*  ANIONGAP 11 13     Hematology Recent Labs  Lab 06/15/19 1340 06/16/19 0730  WBC 10.7* 12.7*  RBC 4.16 3.95  HGB 11.2* 10.6*  HCT 36.8 35.6*  MCV 88.5 90.1  MCH 26.9 26.8  MCHC 30.4 29.8*  RDW 14.8 14.7  PLT 250 231    BNP Recent Labs  Lab 06/15/19 1347  BNP 327.0*     DDimer No results for input(s): DDIMER in the last 168 hours.   Radiology    Dg Chest Portable 1 View  Result Date: 06/15/2019 CLINICAL DATA:  Dyspnea EXAM: PORTABLE CHEST 1 VIEW COMPARISON:  05/31/2019, 05/08/2016, 07/12/2018 FINDINGS: Right-sided central venous port tip over the SVC. Retrocardiac opacity and lucency compatible with large hiatal hernia. Scarring at the bases. Stable cardiomediastinal silhouette. IMPRESSION: No active disease. Similar appearance of large hiatal hernia, mild cardiomegaly, and basilar  scarring Electronically Signed   By: Donavan Foil M.D.   On: 06/15/2019 14:25    Cardiac Studies     Patient Profile     Jream Broyles Dennisis a 73 y.o.femalewith a hx of severe COPDwho is being seen today for the evaluation of aflutter with RVRat the request of Dr Regenia Skeeter.  Assessment & Plan    1. Aflutter with RVR - new diagnosis this admission Initially managed with dilt gtt, now off. Converted back to SR on her own.  - she is on oral dilt 60mg  tid and her home toprol 75mg  daily. Hydralazine stopped for room with her bp for changes CHADS2Vasc score is (CHF, HTN age x1, gender, CAD) 57. Started eliquis 5mg  bid    - bps a little soft on current regiment, will lower dilt dosing. Start long acting 120mg  daily.    2. Acute on chronic diastolic HF - difficult assessment of volume status given body habitus and chronic lymphedema -  echo this admit LVEF 55-60%, grade II diastolic dysfunction, mod LAE  - I/Os are incomplete. She is on lasix 80mg  IV bid and her home metolazone 2.5mg  MWF. Labs are pending this AM. If weights accurate 234 lbs today, down from 246 lbs last admission. She did not have a weight on admit.  - ongoing signs of fluid overload, antcipate one more day of IV diuretics. Had been on torsemide 30mg  bid at home and metolazone 2.5mg  MWF.   3. COPD with chronic resp failure  4. OSA    Would get patient up and walking with PT today. I suspect one more day of IV diuretics today and then likely back to oral tomorrow.   For questions or updates, please contact [place] Please consult [email or web address] for contact info under        Signed, Carlyle Dolly, MD  06/17/2019, 8:21 AM

## 2019-06-18 LAB — BASIC METABOLIC PANEL
Anion gap: 12 (ref 5–15)
BUN: 65 mg/dL — ABNORMAL HIGH (ref 8–23)
CO2: 33 mmol/L — ABNORMAL HIGH (ref 22–32)
Calcium: 7.9 mg/dL — ABNORMAL LOW (ref 8.9–10.3)
Chloride: 97 mmol/L — ABNORMAL LOW (ref 98–111)
Creatinine, Ser: 2.6 mg/dL — ABNORMAL HIGH (ref 0.44–1.00)
GFR calc Af Amer: 20 mL/min — ABNORMAL LOW (ref >60)
GFR calc non Af Amer: 17 mL/min — ABNORMAL LOW (ref >60)
Glucose, Bld: 118 mg/dL — ABNORMAL HIGH (ref 70–99)
Potassium: 3.9 mmol/L (ref 3.5–5.1)
Sodium: 142 mmol/L (ref 135–145)

## 2019-06-18 LAB — GLUCOSE, CAPILLARY
Glucose-Capillary: 103 mg/dL — ABNORMAL HIGH (ref 70–99)
Glucose-Capillary: 129 mg/dL — ABNORMAL HIGH (ref 70–99)
Glucose-Capillary: 159 mg/dL — ABNORMAL HIGH (ref 70–99)
Glucose-Capillary: 88 mg/dL (ref 70–99)

## 2019-06-18 LAB — MAGNESIUM: Magnesium: 1.5 mg/dL — ABNORMAL LOW (ref 1.7–2.4)

## 2019-06-18 MED ORDER — CHLORHEXIDINE GLUCONATE CLOTH 2 % EX PADS
6.0000 | MEDICATED_PAD | Freq: Every day | CUTANEOUS | Status: DC
  Administered 2019-06-18 – 2019-06-21 (×4): 6 via TOPICAL

## 2019-06-18 MED ORDER — CHLORHEXIDINE GLUCONATE 0.12 % MT SOLN
15.0000 mL | Freq: Four times a day (QID) | OROMUCOSAL | Status: AC
  Administered 2019-06-18 – 2019-06-20 (×12): 15 mL via OROMUCOSAL
  Filled 2019-06-18 (×9): qty 15

## 2019-06-18 MED ORDER — MAGNESIUM SULFATE 4 GM/100ML IV SOLN
4.0000 g | Freq: Once | INTRAVENOUS | Status: AC
  Administered 2019-06-18: 4 g via INTRAVENOUS
  Filled 2019-06-18: qty 100

## 2019-06-18 NOTE — Progress Notes (Signed)
PROGRESS NOTE [facility]   TURKESSA OSTROM  ASN:[id number]  DOB: [birth year]  DOA: 06/15/2019 PCP: [name], MD   Brief Admission Hx: [name] is an 73 y.o. female with past medical history significant for hypertension, DM 2, CAD, COPD with chronic hypoxic respiratory failure, CKD 3, HFpEF who was recently discharged last week after treatment of acute decompensated heart failure with increased lower extremity edema and persistent anasarca now presents complaining of weakness.  She was found to be in atrial flutter with RVR which is newly diagnosed.    MDM/Assessment & Plan:   1. Atrial flutter with RVR - Pt was treated with IV diltiazem and now is off IV diltiazem. She has converted to NSR.  She has been started on oral diltiazem per cardiology service.  She remains on toprol.  Holding hydralazine.  She is on apixaban 5 mg BID for full anticoagulation.  2. Acute on chronic diastolic HF - She was on IV lasix per cardiology service and remains on home oral metolazone. They are holding lasix today with bump in creatinine.  Can resume home oral diuretics when creatinine improves.    3. OSA - nightly cpap while in hospital.  4. COPD with chronic respiratory failure - stable, following. 5. Stage 3b CKD - creatinine being followed with diuresis.   DVT prophylaxis: apixaban Code Status: full  Family Communication:  Disposition Plan: telemetry inpatient bed requested, anticipating may need SNF placement at DC  Consultants:  Cardiology   Procedures:    Antimicrobials:     Subjective: Pt resting comfortably. She remains very weak.     Objective: Vitals:   06/18/19 0500 06/18/19 0512 06/18/19 0815 06/18/19 1051  BP:  (!) 120/50    Pulse:  77    Resp:  20    Temp:  98.1 F (36.7 C)    TempSrc:  Oral    SpO2:  96% 97%   Weight: 105.4 kg     Height:    4\' 8"  (1.422 m)    Intake/Output Summary (Last 24 hours) at 06/18/2019 1226 Last data filed at  06/18/2019 0600 Gross per 24 hour  Intake 240 ml  Output 1350 ml  Net -1110 ml   Filed Weights   06/17/19 0527 06/18/19 0500  Weight: 106.2 kg 105.4 kg   REVIEW OF SYSTEMS  As per history otherwise all reviewed and reported negative  Exam:  General exam: awake, alert, NAD, cooperative.  Respiratory system: Clear. No increased work of breathing. Cardiovascular system: normal S1 & S2 heard. No JVD, murmurs, gallops, clicks or pedal edema. Gastrointestinal system: Abdomen is nondistended, soft and nontender. Normal bowel sounds heard. Central nervous system: Alert and oriented. No focal neurological deficits. Extremities: 1+ edema BLEs.  Data Reviewed: Basic Metabolic Panel: Recent Labs  Lab 06/15/19 1340 06/16/19 0730 06/17/19 0916 06/18/19 0505  NA 138 139 141 142  K 3.9 4.1 4.1 3.9  CL 97* 98 96* 97*  CO2 30 28 31  33*  GLUCOSE 181* 166* 122* 118*  BUN 64* 63* 65* 65*  CREATININE 2.05* 1.98* 2.63* 2.60*  CALCIUM 8.2* 8.1* 8.1* 7.9*  MG  --   --  1.7 1.5*   Liver Function Tests: No results for input(s): AST, ALT, ALKPHOS, BILITOT, PROT, ALBUMIN in the last 168 hours. No results for input(s): LIPASE, AMYLASE in the last 168 hours. No results for input(s): AMMONIA in the last 168 hours. CBC: Recent Labs  Lab 06/15/19 1340 06/16/19 0730  WBC  10.7* 12.7*  HGB 11.2* 10.6*  HCT 36.8 35.6*  MCV 88.5 90.1  PLT 250 231   Cardiac Enzymes: No results for input(s): CKTOTAL, CKMB, CKMBINDEX, TROPONINI in the last 168 hours. CBG (last 3)  Recent Labs    06/17/19 2223 06/18/19 0750 06/18/19 1131  GLUCAP 137* 103* 129*   Recent Results (from the past 240 hour(s))  SARS CORONAVIRUS 2 (TAT 6-24 HRS) Nasopharyngeal Nasopharyngeal Swab     Status: None   Collection Time: 06/15/19  1:47 PM   Specimen: Nasopharyngeal Swab  Result Value Ref Range Status   SARS Coronavirus 2 NEGATIVE NEGATIVE Final    Comment: (NOTE) SARS-CoV-2 target nucleic acids are NOT DETECTED.  The SARS-CoV-2 RNA is generally detectable in upper and lower respiratory specimens during the acute phase of infection. Negative results do not preclude SARS-CoV-2 infection, do not rule out co-infections with other pathogens, and should not be used as the sole basis for treatment or other patient management decisions. Negative results must be combined with clinical observations, patient history, and epidemiological information. The expected result is Negative. Fact Sheet for Patients: SugarRoll.be Fact Sheet for Healthcare Providers: https://www.woods-mathews.com/ This test is not yet approved or cleared by the [place] FDA and  has been authorized for detection and/or diagnosis of SARS-CoV-2 by FDA under an Emergency Use Authorization (EUA). This EUA will remain  in effect (meaning this test can be used) for the duration of the COVID-19 declaration under Section 56 4(b)(1) of the Act, 21 U.S.C. section 360bbb-3(b)(1), unless the authorization is terminated or revoked sooner. Performed at [facility], [place] [address], [place], [place] [address]      Studies: No results found. Scheduled Meds: . allopurinol  100 mg Oral BID  . amitriptyline  100 mg Oral QHS  . apixaban  5 mg Oral BID  . atorvastatin  40 mg Oral QHS  . chlorhexidine  15 mL Mouth/Throat QID  . Chlorhexidine Gluconate Cloth  6 each Topical Daily  . diltiazem  120 mg Oral Daily  . fluticasone furoate-vilanterol  1 puff Inhalation Daily  . guaiFENesin  600 mg Oral BID  . insulin aspart  0-20 Units Subcutaneous TID WC  . insulin glargine  12 Units Subcutaneous QHS  . metoprolol succinate  75 mg Oral q morning - 10a  . pantoprazole  40 mg Oral Daily  . umeclidinium bromide  1 puff Inhalation Daily   Continuous Infusions: . magnesium sulfate bolus IVPB 4 g (06/18/19 1035)    Principal Problem:   Atrial flutter with rapid ventricular response (HCC) Active  Problems:   DM type 2 with diabetic peripheral neuropathy (HCC)   Benign essential HTN   OSA on CPAP   Acid reflux   CKD (chronic kidney disease) stage 3, GFR 30-59 ml/min   Chronic obstructive pulmonary disease (HCC)   Chronic respiratory failure with hypoxia (HCC)   Chronic diastolic CHF (congestive heart failure) ([place])   Anasarca   Atrial fibrillation with RVR ([place])  Time spent:   Irwin Brakeman, MD [facility] 06/18/2019, 12:26 PM    LOS: 3 days  How to contact the [facility] Attending or Consulting provider [place] or covering provider during after hours 7P -7A, for this patient?  1. Check the care team in [facility] and look for a) attending/consulting TRH provider listed and b) the [facility] team listed 2. Log into [email or web address] and use [place]'s universal password to access. If you do not have the password, please contact  the hospital operator. 3. Locate the [facility] provider you are looking for under [facility] and page to a number that you can be directly reached. 4. If you still have difficulty reaching the provider, please page the [facility] (Director on Call) for the Hospitalists listed on amion for assistance.

## 2019-06-18 NOTE — Care Management Important Message (Signed)
Important Message  Patient Details  Name: [name] MRN: [id number] Date of Birth: October 05, 1945   Medicare Important Message Given:  Yes     Tommy Medal 06/18/2019, 3:12 PM

## 2019-06-18 NOTE — Plan of Care (Signed)

## 2019-06-18 NOTE — TOC Progression Note (Signed)
Transition of Care [facility]) - Progression Note    Patient Details  Name: [name] MRN: [id number] Date of Birth: [birth year]  [facility] of [facility]) CM/SW Contact  Shade Flood, LCSW Phone Number: 06/18/2019, 1:09 PM  Clinical Narrative:     TOC following. Pt status discussed in Progression today. Per MD, pt will remain hospitalized through the weekend. Met with pt to follow up on SNF rehab referrals. Pt appears to be agreeable to SNF rehab.   At this time anticipating dc to [facility] early next week.   Expected Discharge Plan: [place] Barriers to Discharge: Continued Medical Work up  Expected Discharge Plan and Services Expected Discharge Plan: [place] In-house Referral: Clinical Social Work Discharge Planning Services: CM Consult   Living arrangements for the past 2 months: [place]                   DME Agency: [employer]     Representative spoke with at DME Agency: [place] (SDOH) Interventions    Readmission Risk Interventions Readmission Risk Prevention Plan 06/18/2019 06/08/2019  Transportation Screening Complete Complete  PCP or Specialist Appt within 3-5 Days - Complete  HRI or [place] - Not Complete  HRI or Home Care Consult comments - patient declining home health  Social Work Consult for [place] Planning/Counseling Complete Complete  Palliative Care Screening Not Applicable Not Applicable  Medication Review Press photographer) Complete Complete  Some recent data might be hidden

## 2019-06-18 NOTE — Progress Notes (Signed)
Progress Note  Patient Name: ALEYZA SALMI Date of Encounter: 06/18/2019  Primary Cardiologist: [name], MD   Subjective   Some ongoing SOB  Inpatient Medications    Scheduled Meds: . allopurinol  100 mg Oral BID  . amitriptyline  100 mg Oral QHS  . apixaban  5 mg Oral BID  . atorvastatin  40 mg Oral QHS  . Chlorhexidine Gluconate Cloth  6 each Topical Daily  . diltiazem  120 mg Oral Daily  . fluticasone furoate-vilanterol  1 puff Inhalation Daily  . furosemide  80 mg Intravenous BID  . guaiFENesin  600 mg Oral BID  . insulin aspart  0-20 Units Subcutaneous TID WC  . insulin glargine  12 Units Subcutaneous QHS  . metolazone  2.5 mg Oral Q M,W,F  . metoprolol succinate  75 mg Oral q morning - 10a  . pantoprazole  40 mg Oral Daily  . umeclidinium bromide  1 puff Inhalation Daily   Continuous Infusions: . magnesium sulfate bolus IVPB     PRN Meds: acetaminophen, famotidine, loperamide, metoprolol tartrate, ondansetron (ZOFRAN) IV   Vital Signs    Vitals:   06/17/19 2059 06/18/19 0500 06/18/19 0512 06/18/19 0815  BP: 138/65  (!) 120/50   Pulse: 66  77   Resp: 18  20   Temp: 98.6 F (37 C)  98.1 F (36.7 C)   TempSrc: Oral  Oral   SpO2: 98%  96% 97%  Weight:  105.4 kg      Intake/Output Summary (Last 24 hours) at 06/18/2019 0829 Last data filed at 06/18/2019 0600 Gross per 24 hour  Intake 240 ml  Output 1350 ml  Net -1110 ml   Last 3 Weights 06/18/2019 06/17/2019 06/08/2019  Weight (lbs) 232 lb 5.8 oz 234 lb 2.1 oz (No Data)  Weight (kg) 105.4 kg 106.2 kg (No Data)      Telemetry    SR - Personally Reviewed  ECG    n/a - Personally Reviewed  Physical Exam   GEN: No acute distress.   Neck: No JVD Cardiac: RRR, no murmurs, rubs, or gallops.  Respiratory: Clear to auscultation bilaterally. GI: Soft, nontender, non-distended  MS: 2+ bilateral LE edema; No deformity. Neuro:  Nonfocal  Psych: Normal affect   Labs    High  Sensitivity Troponin:   Recent Labs  Lab 06/15/19 1344 06/15/19 1714  TROPONINIHS 25* 28*      Chemistry Recent Labs  Lab 06/16/19 0730 06/17/19 0916 06/18/19 0505  NA 139 141 142  K 4.1 4.1 3.9  CL 98 96* 97*  CO2 28 31 33*  GLUCOSE 166* 122* 118*  BUN 63* 65* 65*  CREATININE 1.98* 2.63* 2.60*  CALCIUM 8.1* 8.1* 7.9*  GFRNONAA 24* 17* 17*  GFRAA 28* 20* 20*  ANIONGAP 13 14 12      Hematology Recent Labs  Lab 06/15/19 1340 06/16/19 0730  WBC 10.7* 12.7*  RBC 4.16 3.95  HGB 11.2* 10.6*  HCT 36.8 35.6*  MCV 88.5 90.1  MCH 26.9 26.8  MCHC 30.4 29.8*  RDW 14.8 14.7  PLT 250 231    BNP Recent Labs  Lab 06/15/19 1347  BNP 327.0*     DDimer No results for input(s): DDIMER in the last 168 hours.   Radiology    No results found.  Cardiac Studies    Patient Profile  Vernica Wachtel Dennisis a 73 y.o.femalewith a hx of severe COPDwho is being seen today for the evaluation of aflutter with RVRat  the request of Dr Regenia Skeeter.  Assessment & Plan    1. Aflutter with RVR - new diagnosis this admission Initially managed with dilt gtt, now off. Converted back to SR on her own.  CHADS2Vasc score is (CHF, HTN age x1, gender, CAD) 5. Startedeliquis 5mg  bid   - currerntly on dilt 120 and toprol 75 for rate control   2. Acute on chronic diastolic HF - difficult assessment of volume status given body habitus and chronic lymphedema - echo this admit LVEF 55-60%, grade II diastolic dysfunction, mod LAE  - I/Os are incomplete. She is on lasix 80mg  IV bid and her home metolazone 2.5mg  MWF. Labs are pending this AM. If weights accurate 232 lbs today, down from 246 lbs last admission. She did not have a weight on admit.    - significant uptrend in Cr, hold diuretics today. When AKI resolves would resume her prior home regimen. I think she presented with some decompensation due to aflutter with RVR as opposed to diuretic failure.  - significant chronic LE edema  and lymphedema, labs and weights would suggest she is not total body fluid overloaded.  Also had normal IVC by echo  3. COPD with chronic resp failure  4. OSA  Would recommend a PT evaluation, get patient ambulating and monitor symptoms    For questions or updates, please contact [place] Please consult [email or web address] for contact info under        Signed, Carlyle Dolly, MD  06/18/2019, 8:29 AM

## 2019-06-19 DIAGNOSIS — Z515 Encounter for palliative care: Secondary | ICD-10-CM

## 2019-06-19 LAB — BASIC METABOLIC PANEL
Anion gap: 14 (ref 5–15)
BUN: 65 mg/dL — ABNORMAL HIGH (ref 8–23)
CO2: 33 mmol/L — ABNORMAL HIGH (ref 22–32)
Calcium: 8.2 mg/dL — ABNORMAL LOW (ref 8.9–10.3)
Chloride: 95 mmol/L — ABNORMAL LOW (ref 98–111)
Creatinine, Ser: 2.44 mg/dL — ABNORMAL HIGH (ref 0.44–1.00)
GFR calc Af Amer: 22 mL/min — ABNORMAL LOW (ref >60)
GFR calc non Af Amer: 19 mL/min — ABNORMAL LOW (ref >60)
Glucose, Bld: 117 mg/dL — ABNORMAL HIGH (ref 70–99)
Potassium: 3.9 mmol/L (ref 3.5–5.1)
Sodium: 142 mmol/L (ref 135–145)

## 2019-06-19 LAB — GLUCOSE, CAPILLARY
Glucose-Capillary: 82 mg/dL (ref 70–99)
Glucose-Capillary: 113 mg/dL — ABNORMAL HIGH (ref 70–99)
Glucose-Capillary: 169 mg/dL — ABNORMAL HIGH (ref 70–99)
Glucose-Capillary: 162 mg/dL — ABNORMAL HIGH (ref 70–99)
Glucose-Capillary: 129 mg/dL — ABNORMAL HIGH (ref 70–99)

## 2019-06-19 NOTE — Progress Notes (Signed)
PROGRESS NOTE [name]   [name]  ZOX:[id number]  DOB: 1945-09-03  DOA: 06/15/2019 PCP: [name], MD   Brief Admission Hx: [name] is an 73 y.o. female with past medical history significant for hypertension, DM 2, CAD, COPD with chronic hypoxic respiratory failure, CKD 3, HFpEF who was recently discharged last week after treatment of acute decompensated heart failure with increased lower extremity edema and persistent anasarca now presents complaining of weakness.  She was found to be in atrial flutter with RVR which is newly diagnosed.    MDM/Assessment & Plan:   1. Atrial flutter with RVR - Pt was treated with IV diltiazem and now is off IV diltiazem. She has converted to NSR.  She has been started on oral diltiazem per cardiology service.  She remains on toprol.  Holding hydralazine.  She is on apixaban 5 mg BID for full anticoagulation.  Her HR is staying under control.  2. Acute on chronic diastolic HF - She was on IV lasix per cardiology service and remains on home oral metolazone. They are holding lasix temporarily with bump in creatinine.  Can resume home oral diuretics when creatinine improves likely in next 1-2 days.    3. OSA - nightly cpap while in hospital.  4. COPD with chronic respiratory failure - stable, following. 5. Stage 3b CKD - creatinine being followed with diuresis.   DVT prophylaxis: apixaban Code Status: full  Family Communication: patient updated bedside, verbalized understanding Disposition Plan: anticipating may need SNF placement at DC  Consultants:  Cardiology   Procedures:    Antimicrobials:     Subjective: Pt watching tv, no complaints, confirms willingness to go to SNF.     Objective: Vitals:   06/18/19 1450 06/18/19 2140 06/19/19 0500 06/19/19 0956  BP: (!) 131/57 (!) 141/51 (!) 116/50   Pulse: 71 84 73   Resp:  20    Temp: 98.3 F (36.8 C) 99.6 F (37.6 C) 98.4 F (36.9 C)   TempSrc:  Oral  Oral   SpO2: 97% 96% 96% 96%  Weight:   101.7 kg   Height:        Intake/Output Summary (Last 24 hours) at 06/19/2019 1216 Last data filed at 06/19/2019 0700 Gross per 24 hour  Intake 340 ml  Output 1300 ml  Net -960 ml   Filed Weights   06/17/19 0527 06/18/19 0500 06/19/19 0500  Weight: 106.2 kg 105.4 kg 101.7 kg   REVIEW OF SYSTEMS  As per history otherwise all reviewed and reported negative  Exam:  General exam: awake, alert, NAD, cooperative.  Respiratory system: Clear. No increased work of breathing. Cardiovascular system: normal S1 & S2 heard. No JVD, murmurs, gallops, clicks or pedal edema. Gastrointestinal system: Abdomen is nondistended, soft and nontender. Normal bowel sounds heard. Central nervous system: Alert and oriented. No focal neurological deficits. Extremities: 1+ edema BLEs unchanged.  Data Reviewed: Basic Metabolic Panel: Recent Labs  Lab 06/15/19 1340 06/16/19 0730 06/17/19 0916 06/18/19 0505 06/19/19 0658  NA 138 139 141 142 142  K 3.9 4.1 4.1 3.9 3.9  CL 97* 98 96* 97* 95*  CO2 30 28 31  33* 33*  GLUCOSE 181* 166* 122* 118* 117*  BUN 64* 63* 65* 65* 65*  CREATININE 2.05* 1.98* 2.63* 2.60* 2.44*  CALCIUM 8.2* 8.1* 8.1* 7.9* 8.2*  MG  --   --  1.7 1.5*  --    Liver Function Tests: No results for input(s): AST, ALT, ALKPHOS, BILITOT,  PROT, ALBUMIN in the last 168 hours. No results for input(s): LIPASE, AMYLASE in the last 168 hours. No results for input(s): AMMONIA in the last 168 hours. CBC: Recent Labs  Lab 06/15/19 1340 06/16/19 0730  WBC 10.7* 12.7*  HGB 11.2* 10.6*  HCT 36.8 35.6*  MCV 88.5 90.1  PLT 250 231   Cardiac Enzymes: No results for input(s): CKTOTAL, CKMB, CKMBINDEX, TROPONINI in the last 168 hours. CBG (last 3)  Recent Labs    06/19/19 0258 06/19/19 0740 06/19/19 1058  GLUCAP 82 113* 169*   Recent Results (from the past 240 hour(s))  SARS CORONAVIRUS 2 (TAT 6-24 HRS) Nasopharyngeal Nasopharyngeal Swab      Status: None   Collection Time: 06/15/19  1:47 PM   Specimen: Nasopharyngeal Swab  Result Value Ref Range Status   SARS Coronavirus 2 NEGATIVE NEGATIVE Final    Comment: (NOTE) SARS-CoV-2 target nucleic acids are NOT DETECTED. The SARS-CoV-2 RNA is generally detectable in upper and lower respiratory specimens during the acute phase of infection. Negative results do not preclude SARS-CoV-2 infection, do not rule out co-infections with other pathogens, and should not be used as the sole basis for treatment or other patient management decisions. Negative results must be combined with clinical observations, patient history, and epidemiological information. The expected result is Negative. Fact Sheet for Patients: SugarRoll.be Fact Sheet for Healthcare Providers: https://www.woods-mathews.com/ This test is not yet approved or cleared by the [place] FDA and  has been authorized for detection and/or diagnosis of SARS-CoV-2 by FDA under an Emergency Use Authorization (EUA). This EUA will remain  in effect (meaning this test can be used) for the duration of the COVID-19 declaration under Section 56 4(b)(1) of the Act, 21 U.S.C. section 360bbb-3(b)(1), unless the authorization is terminated or revoked sooner. Performed at [facility], [place] [address] [place], [place] [address]      Studies: No results found. Scheduled Meds: . allopurinol  100 mg Oral BID  . amitriptyline  100 mg Oral QHS  . apixaban  5 mg Oral BID  . atorvastatin  40 mg Oral QHS  . chlorhexidine  15 mL Mouth/Throat QID  . Chlorhexidine Gluconate Cloth  6 each Topical Daily  . diltiazem  120 mg Oral Daily  . fluticasone furoate-vilanterol  1 puff Inhalation Daily  . guaiFENesin  600 mg Oral BID  . insulin aspart  0-20 Units Subcutaneous TID WC  . insulin glargine  12 Units Subcutaneous QHS  . metoprolol succinate  75 mg Oral q morning - 10a  . pantoprazole  40  mg Oral Daily  . umeclidinium bromide  1 puff Inhalation Daily   Continuous Infusions:   Principal Problem:   Atrial flutter with rapid ventricular response (HCC) Active Problems:   DM type 2 with diabetic peripheral neuropathy (HCC)   Benign essential HTN   OSA on CPAP   Acid reflux   CKD (chronic kidney disease) stage 3, GFR 30-59 ml/min   Chronic obstructive pulmonary disease (HCC)   Chronic respiratory failure with hypoxia (HCC)   Chronic diastolic CHF (congestive heart failure) (HCC)   Anasarca   Atrial fibrillation with RVR ([place])  Time spent:   Irwin Brakeman, MD [facility] 06/19/2019, 12:16 PM    LOS: 4 days  How to contact the [facility] Attending or Consulting provider [place] or covering provider during after hours [place], for this patient?  1. Check the care team in [facility] and look for a) attending/consulting TRH  provider listed and b) the [facility] team listed 2. Log into [email or web address] and use [place]'s universal password to access. If you do not have the password, please contact the hospital operator. 3. Locate the [facility] provider you are looking for under [facility] and page to a number that you can be directly reached. 4. If you still have difficulty reaching the provider, please page the [facility] (Director on Call) for the Hospitalists listed on amion for assistance.

## 2019-06-19 NOTE — Progress Notes (Signed)
Pt was alert and oriented. Sitting upright and watching TV. She tolerated her morning medications and CHG mouthwash. Will continue to monitor her.

## 2019-06-20 LAB — GLUCOSE, CAPILLARY
Glucose-Capillary: 116 mg/dL — ABNORMAL HIGH (ref 70–99)
Glucose-Capillary: 178 mg/dL — ABNORMAL HIGH (ref 70–99)
Glucose-Capillary: 147 mg/dL — ABNORMAL HIGH (ref 70–99)
Glucose-Capillary: 170 mg/dL — ABNORMAL HIGH (ref 70–99)

## 2019-06-20 LAB — BASIC METABOLIC PANEL
Anion gap: 12 (ref 5–15)
BUN: 62 mg/dL — ABNORMAL HIGH (ref 8–23)
CO2: 33 mmol/L — ABNORMAL HIGH (ref 22–32)
Calcium: 8.1 mg/dL — ABNORMAL LOW (ref 8.9–10.3)
Chloride: 95 mmol/L — ABNORMAL LOW (ref 98–111)
Creatinine, Ser: 2.24 mg/dL — ABNORMAL HIGH (ref 0.44–1.00)
GFR calc Af Amer: 24 mL/min — ABNORMAL LOW (ref >60)
GFR calc non Af Amer: 21 mL/min — ABNORMAL LOW (ref >60)
Glucose, Bld: 103 mg/dL — ABNORMAL HIGH (ref 70–99)
Potassium: 4 mmol/L (ref 3.5–5.1)
Sodium: 140 mmol/L (ref 135–145)

## 2019-06-20 MED ORDER — TORSEMIDE 20 MG PO TABS
30.0000 mg | ORAL_TABLET | Freq: Two times a day (BID) | ORAL | Status: DC
  Administered 2019-06-21: 30 mg via ORAL
  Filled 2019-06-20: qty 2

## 2019-06-20 MED ORDER — METOLAZONE 5 MG PO TABS
2.5000 mg | ORAL_TABLET | ORAL | Status: DC
  Administered 2019-06-21: 2.5 mg via ORAL
  Filled 2019-06-20: qty 1

## 2019-06-20 NOTE — Plan of Care (Signed)

## 2019-06-20 NOTE — Progress Notes (Signed)
PROGRESS NOTE [facility]   [name]  HWE:[id number]  DOB: [birth year]  DOA: 06/15/2019 PCP: [name], MD   Brief Admission Hx: [name] is an 73 y.o. female with past medical history significant for hypertension, DM 2, CAD, COPD with chronic hypoxic respiratory failure, CKD 3, HFpEF who was recently discharged last week after treatment of acute decompensated heart failure with increased lower extremity edema and persistent anasarca now presents complaining of weakness.  She was found to be in atrial flutter with RVR which is newly diagnosed.    MDM/Assessment & Plan:   1. Atrial flutter with RVR - RESOLVED NOW.  Pt was treated with IV diltiazem and now is off IV diltiazem. She has converted to NSR.  She has been started on oral diltiazem per cardiology service.  She remains on toprol.  Holding hydralazine.  She is on apixaban 5 mg BID for full anticoagulation.  Her HR is staying under control.  DC telemetry in anticipation of DC tomorrow.  2. Acute on chronic diastolic HF - She was on IV lasix per cardiology service and remains on home oral metolazone. They are holding lasix and metolazone temporarily with bump in creatinine.  Creatinine is improving, plan to resume home oral diuretics 11/16.    3. OSA - nightly cpap while in hospital.  4. COPD with chronic respiratory failure - stable, following. 5. Stage 3b CKD - creatinine being followed with diuresis.   DVT prophylaxis: apixaban Code Status: full  Family Communication: patient updated bedside, verbalized understanding Disposition Plan: SNF 11/16  Consultants:  Cardiology   Procedures:    Antimicrobials:     Subjective: Pt without complaints today, continues to confirm willingness to go to SNF.     Objective: Vitals:   06/19/19 1700 06/19/19 2150 06/20/19 0531 06/20/19 1013  BP: (!) 128/48 (!) 121/48 (!) 129/45   Pulse: 73 73 80   Resp: (!) 24 20    Temp: 98.6 F (37 C) 98.8 F  (37.1 C)    TempSrc: Oral Oral    SpO2: 96% 100% 96% 97%  Weight:   104.4 kg   Height:        Intake/Output Summary (Last 24 hours) at 06/20/2019 1045 Last data filed at 06/20/2019 0700 Gross per 24 hour  Intake 240 ml  Output 800 ml  Net -560 ml   Filed Weights   06/18/19 0500 06/19/19 0500 06/20/19 0531  Weight: 105.4 kg 101.7 kg 104.4 kg   REVIEW OF SYSTEMS  As per history otherwise all reviewed and reported negative  Exam:  General exam: awake, alert, NAD, cooperative.  Respiratory system: Clear. No increased work of breathing. Cardiovascular system: normal S1 & S2 heard. No JVD, murmurs, gallops, clicks or pedal edema. Gastrointestinal system: Abdomen is nondistended, soft and nontender. Normal bowel sounds heard. Central nervous system: Alert and oriented. No focal neurological deficits. Extremities: 1+ edema BLEs unchanged.  Data Reviewed: Basic Metabolic Panel: Recent Labs  Lab 06/16/19 0730 06/17/19 0916 06/18/19 0505 06/19/19 0658 06/20/19 0641  NA 139 141 142 142 140  K 4.1 4.1 3.9 3.9 4.0  CL 98 96* 97* 95* 95*  CO2 28 31 33* 33* 33*  GLUCOSE 166* 122* 118* 117* 103*  BUN 63* 65* 65* 65* 62*  CREATININE 1.98* 2.63* 2.60* 2.44* 2.24*  CALCIUM 8.1* 8.1* 7.9* 8.2* 8.1*  MG  --  1.7 1.5*  --   --    Liver Function Tests: No results for input(s):  AST, ALT, ALKPHOS, BILITOT, PROT, ALBUMIN in the last 168 hours. No results for input(s): LIPASE, AMYLASE in the last 168 hours. No results for input(s): AMMONIA in the last 168 hours. CBC: Recent Labs  Lab 06/15/19 1340 06/16/19 0730  WBC 10.7* 12.7*  HGB 11.2* 10.6*  HCT 36.8 35.6*  MCV 88.5 90.1  PLT 250 231   Cardiac Enzymes: No results for input(s): CKTOTAL, CKMB, CKMBINDEX, TROPONINI in the last 168 hours. CBG (last 3)  Recent Labs    06/19/19 1558 06/19/19 2147 06/20/19 0736  GLUCAP 162* 129* 116*   Recent Results (from the past 240 hour(s))  SARS CORONAVIRUS 2 (TAT 6-24 HRS)  Nasopharyngeal Nasopharyngeal Swab     Status: None   Collection Time: 06/15/19  1:47 PM   Specimen: Nasopharyngeal Swab  Result Value Ref Range Status   SARS Coronavirus 2 NEGATIVE NEGATIVE Final    Comment: (NOTE) SARS-CoV-2 target nucleic acids are NOT DETECTED. The SARS-CoV-2 RNA is generally detectable in upper and lower respiratory specimens during the acute phase of infection. Negative results do not preclude SARS-CoV-2 infection, do not rule out co-infections with other pathogens, and should not be used as the sole basis for treatment or other patient management decisions. Negative results must be combined with clinical observations, patient history, and epidemiological information. The expected result is Negative. Fact Sheet for Patients: SugarRoll.be Fact Sheet for Healthcare Providers: https://www.woods-mathews.com/ This test is not yet approved or cleared by the [place] FDA and  has been authorized for detection and/or diagnosis of SARS-CoV-2 by FDA under an Emergency Use Authorization (EUA). This EUA will remain  in effect (meaning this test can be used) for the duration of the COVID-19 declaration under Section 56 4(b)(1) of the Act, 21 U.S.C. section 360bbb-3(b)(1), unless the authorization is terminated or revoked sooner. Performed at [facility], [place] [address] [place], [place] [address]      Studies: No results found. Scheduled Meds: . allopurinol  100 mg Oral BID  . amitriptyline  100 mg Oral QHS  . apixaban  5 mg Oral BID  . atorvastatin  40 mg Oral QHS  . chlorhexidine  15 mL Mouth/Throat QID  . Chlorhexidine Gluconate Cloth  6 each Topical Daily  . diltiazem  120 mg Oral Daily  . fluticasone furoate-vilanterol  1 puff Inhalation Daily  . guaiFENesin  600 mg Oral BID  . insulin aspart  0-20 Units Subcutaneous TID WC  . insulin glargine  12 Units Subcutaneous QHS  . [START ON 06/21/2019] metolazone   2.5 mg Oral Q M,W,F  . metoprolol succinate  75 mg Oral q morning - 10a  . pantoprazole  40 mg Oral Daily  . [START ON 06/21/2019] torsemide  30 mg Oral BID  . umeclidinium bromide  1 puff Inhalation Daily   Continuous Infusions:   Principal Problem:   Atrial flutter with rapid ventricular response (HCC) Active Problems:   DM type 2 with diabetic peripheral neuropathy (HCC)   Benign essential HTN   OSA on CPAP   Acid reflux   CKD (chronic kidney disease) stage 3, GFR 30-59 ml/min   Chronic obstructive pulmonary disease (HCC)   Chronic respiratory failure with hypoxia (HCC)   Chronic diastolic CHF (congestive heart failure) (HCC)   Anasarca   Atrial fibrillation with RVR ([place])   Quality of life palliative care encounter  Time spent:   Irwin Brakeman, MD [facility] 06/20/2019, 10:45 AM    LOS: 5 days  How to  contact the [facility] Attending or Consulting provider [place] or covering provider during after hours [place], for this patient?  1. Check the care team in [facility] and look for a) attending/consulting TRH provider listed and b) the [facility] team listed 2. Log into [email or web address] and use [place]'s universal password to access. If you do not have the password, please contact the hospital operator. 3. Locate the [facility] provider you are looking for under [facility] and page to a number that you can be directly reached. 4. If you still have difficulty reaching the provider, please page the [facility] (Director on Call) for the Hospitalists listed on amion for assistance.

## 2019-06-21 ENCOUNTER — Inpatient Hospital Stay
Admission: RE | Admit: 2019-06-21 | Discharge: 2019-07-04 | Disposition: A | Discharge status: Nursing Home (Includes ICF) | Payer: Medicare Other | Source: Ambulatory Visit | Attending: Internal Medicine | Admitting: Internal Medicine

## 2019-06-21 DIAGNOSIS — Z515 Encounter for palliative care: Secondary | ICD-10-CM

## 2019-06-21 LAB — GLUCOSE, CAPILLARY
Glucose-Capillary: 100 mg/dL — ABNORMAL HIGH (ref 70–99)
Glucose-Capillary: 142 mg/dL — ABNORMAL HIGH (ref 70–99)

## 2019-06-21 MED ORDER — APIXABAN 5 MG PO TABS: 5.0000 mg | ORAL_TABLET | Freq: Two times a day (BID) | ORAL | Status: DC

## 2019-06-21 MED ORDER — DILTIAZEM HCL ER COATED BEADS 120 MG PO CP24: 120.0000 mg | ORAL_CAPSULE | Freq: Every day | ORAL | Status: DC

## 2019-06-21 NOTE — Progress Notes (Signed)
Report called to [place] at [facility] and all questions answered. Huber needle left accessed at right chest port per request of [place] at [facility]. Patient awaiting [facility] staff arrival to transport to facility.  Patient's son [name] made aware of patient's discharge to the [facility].

## 2019-06-21 NOTE — Discharge Summary (Signed)
Physician Discharge Summary  [name] TDD:[id number] DOB: 19-Jan-1946 DOA: 06/15/2019  PCP: [name], MD Cardiologist: Dr. Domenic Polite   Admit date: 06/15/2019 Discharge date: 06/21/2019  Admitted From:  Home  Disposition: SNF  Recommendations for Outpatient Follow-up:  1. Follow up with PCP and cardiologist in 2 weeks 2. Please check BMP in 1 week to follow renal function and electrolytes  Discharge Condition: STABLE   CODE STATUS: FULL    Brief Hospitalization Summary: Please see all hospital notes, images, labs for full details of the hospitalization.  Brief Admission Hx: [name] an 73 y.o.femalewith past medical history significant for hypertension, DM 2, CAD, COPD with chronic hypoxic respiratory failure, CKD 3, HFpEF who was recently discharged last week after treatment of acute decompensated heart failure with increased lower extremity edema and persistent anasarcanow presents complaining of weakness.  She was found to be in atrial flutter with RVR which is newly diagnosed.    MDM/Assessment & Plan:   1. Atrial flutter with RVR - RESOLVED NOW.  Pt was treated with IV diltiazem and now is off IV diltiazem. She has converted to NSR.  She has been started on oral diltiazem per cardiology service.  She remains on toprol.  Holding hydralazine.  She is on apixaban 5 mg BID for full anticoagulation.  Her HR is staying under control.   2. Acute on chronic diastolic HF - She was on IV lasix per cardiology service and resuming home oral diuretics at discharge.       3. OSA - nightly cpap ordered while in hospital.  4. COPD with chronic respiratory failure - stable, following. 5. Stage 3b CKD - creatinine being followed with diuresis.   DVT prophylaxis: apixaban Code Status: full  Family Communication: patient updated bedside, verbalized understanding Disposition Plan: SNF 11/16  Consultants:  Cardiology    Procedures:    Antimicrobials:     Discharge Diagnoses:  Principal Problem:   Atrial flutter with rapid ventricular response ([place]) Active Problems:   DM type 2 with diabetic peripheral neuropathy (HCC)   Benign essential HTN   OSA on CPAP   Acid reflux   CKD (chronic kidney disease) stage 3, GFR 30-59 ml/min   Chronic obstructive pulmonary disease (HCC)   Chronic respiratory failure with hypoxia (HCC)   Chronic diastolic CHF (congestive heart failure) (HCC)   Anasarca   Atrial fibrillation with RVR ([place])   Quality of life palliative care encounter   Discharge Instructions:  Allergies as of 06/21/2019   No Known Allergies     Medication List    STOP taking these medications   meclizine 25 MG tablet Commonly known as: ANTIVERT   predniSONE 20 MG tablet Commonly known as: DELTASONE     TAKE these medications   albuterol (2.5 MG/3ML) 0.083% nebulizer solution Commonly known as: PROVENTIL Take 2.5 mg by nebulization every 6 (six) hours as needed for wheezing or shortness of breath.   Ventolin HFA 108 (90 Base) MCG/ACT inhaler Generic drug: albuterol Inhale 2 puffs into the lungs every 6 (six) hours as needed for wheezing or shortness of breath.   allopurinol 100 MG tablet Commonly known as: ZYLOPRIM Take 100 mg by mouth 2 (two) times daily.   amitriptyline 100 MG tablet Commonly known as: ELAVIL Take 100 mg by mouth at bedtime.   apixaban 5 MG Tabs tablet Commonly known as: ELIQUIS Take 1 tablet (5 mg total) by mouth 2 (two) times daily.   aspirin EC 81 MG tablet  Take 81 mg by mouth daily.   atorvastatin 40 MG tablet Commonly known as: LIPITOR Take 1 tablet (40 mg total) by mouth at bedtime.   BIOTIN PO Take 1 capsule by mouth daily.   diltiazem 120 MG 24 hr capsule Commonly known as: CARDIZEM CD Take 1 capsule (120 mg total) by mouth daily. Start taking on: June 22, 2019   esomeprazole 40 MG capsule Commonly known as: NEXIUM TAKE  1 CAPSULE TWICE A DAY 30 MINUTES PRIOR TO MEALS What changed: See the new instructions.   famotidine 40 MG tablet Commonly known as: PEPCID TAKE 1 TABLET AT BEDTIME AS NEEDED TO CONTROL REFLUX What changed:   how much to take  how to take this  when to take this  reasons to take this  additional instructions   guaiFENesin 600 MG 12 hr tablet Commonly known as: MUCINEX Take 1 tablet (600 mg total) by mouth 2 (two) times daily.   hydrALAZINE 25 MG tablet Commonly known as: APRESOLINE Take 1 tablet (25 mg total) by mouth 2 (two) times daily.   insulin lispro 100 UNIT/ML KiwkPen Commonly known as: HUMALOG Inject 14-16 Units into the skin daily as needed (per sliding scale for high blood sugars starting at 120).   Lantus SoloStar 100 UNIT/ML Solostar Pen Generic drug: Insulin Glargine Inject 16 Units into the skin at bedtime.   loperamide 2 MG capsule Commonly known as: IMODIUM Take 2 mg by mouth as needed for diarrhea or loose stools.   metolazone 2.5 MG tablet Commonly known as: ZAROXOLYN Take 1 tablet (2.5 mg total) by mouth every Monday, Wednesday, and Friday.   metoprolol succinate 50 MG 24 hr tablet Commonly known as: TOPROL-XL Take 75 mg by mouth daily. Take with or immediately following a meal. What changed:   how much to take  how to take this  when to take this  additional instructions   torsemide 10 MG tablet Commonly known as: DEMADEX Take 3 tablets (30 mg total) by mouth 2 (two) times daily.   Trelegy Ellipta 100-62.5-25 MCG/INH Aepb Generic drug: Fluticasone-Umeclidin-Vilant Inhale 1 puff into the lungs daily.      Follow-up Information    [name], MD. Schedule an appointment as soon as possible for a visit in 2 week(s).   Specialty: Family Medicine Contact information: [place] [address] [phone]        Satira Sark, MD. Schedule an appointment as soon as possible for a visit in 2 week(s).    Specialty: Cardiology Contact information: [place] [address] [phone]          No Known Allergies Allergies as of 06/21/2019   No Known Allergies     Medication List    STOP taking these medications   meclizine 25 MG tablet Commonly known as: ANTIVERT   predniSONE 20 MG tablet Commonly known as: DELTASONE     TAKE these medications   albuterol (2.5 MG/3ML) 0.083% nebulizer solution Commonly known as: PROVENTIL Take 2.5 mg by nebulization every 6 (six) hours as needed for wheezing or shortness of breath.   Ventolin HFA 108 (90 Base) MCG/ACT inhaler Generic drug: albuterol Inhale 2 puffs into the lungs every 6 (six) hours as needed for wheezing or shortness of breath.   allopurinol 100 MG tablet Commonly known as: ZYLOPRIM Take 100 mg by mouth 2 (two) times daily.   amitriptyline 100 MG tablet Commonly known as: ELAVIL Take 100  mg by mouth at bedtime.   apixaban 5 MG Tabs tablet Commonly known as: ELIQUIS Take 1 tablet (5 mg total) by mouth 2 (two) times daily.   aspirin EC 81 MG tablet Take 81 mg by mouth daily.   atorvastatin 40 MG tablet Commonly known as: LIPITOR Take 1 tablet (40 mg total) by mouth at bedtime.   BIOTIN PO Take 1 capsule by mouth daily.   diltiazem 120 MG 24 hr capsule Commonly known as: CARDIZEM CD Take 1 capsule (120 mg total) by mouth daily. Start taking on: June 22, 2019   esomeprazole 40 MG capsule Commonly known as: NEXIUM TAKE 1 CAPSULE TWICE A DAY 30 MINUTES PRIOR TO MEALS What changed: See the new instructions.   famotidine 40 MG tablet Commonly known as: PEPCID TAKE 1 TABLET AT BEDTIME AS NEEDED TO CONTROL REFLUX What changed:   how much to take  how to take this  when to take this  reasons to take this  additional instructions   guaiFENesin 600 MG 12 hr tablet Commonly known as: MUCINEX Take 1 tablet (600 mg total) by mouth 2 (two) times daily.   hydrALAZINE 25 MG  tablet Commonly known as: APRESOLINE Take 1 tablet (25 mg total) by mouth 2 (two) times daily.   insulin lispro 100 UNIT/ML KiwkPen Commonly known as: HUMALOG Inject 14-16 Units into the skin daily as needed (per sliding scale for high blood sugars starting at 120).   Lantus SoloStar 100 UNIT/ML Solostar Pen Generic drug: Insulin Glargine Inject 16 Units into the skin at bedtime.   loperamide 2 MG capsule Commonly known as: IMODIUM Take 2 mg by mouth as needed for diarrhea or loose stools.   metolazone 2.5 MG tablet Commonly known as: ZAROXOLYN Take 1 tablet (2.5 mg total) by mouth every Monday, Wednesday, and Friday.   metoprolol succinate 50 MG 24 hr tablet Commonly known as: TOPROL-XL Take 75 mg by mouth daily. Take with or immediately following a meal. What changed:   how much to take  how to take this  when to take this  additional instructions   torsemide 10 MG tablet Commonly known as: DEMADEX Take 3 tablets (30 mg total) by mouth 2 (two) times daily.   Trelegy Ellipta 100-62.5-25 MCG/INH Aepb Generic drug: Fluticasone-Umeclidin-Vilant Inhale 1 puff into the lungs daily.       Procedures/Studies: Ct Abdomen Pelvis Wo Contrast  Result Date: 05/31/2019 CLINICAL DATA:  Abdominal distension increasing over the past 3 weeks EXAM: CT ABDOMEN AND PELVIS WITHOUT CONTRAST TECHNIQUE: Multidetector CT imaging of the abdomen and pelvis was performed following the standard protocol without IV contrast. COMPARISON:  05/25/2019 FINDINGS: Lower chest: 5 mm nodule is noted in the medial aspect of the left lower lobe. A few smaller associated nodules are noted. Large hiatal hernia is seen with almost the entire stomach within the chest. Hepatobiliary: No focal liver abnormality is seen. Status post cholecystectomy. No biliary dilatation. Pancreas: Unremarkable. No pancreatic ductal dilatation or surrounding inflammatory changes. Spleen: Normal in size without focal abnormality.  Adrenals/Urinary Tract: Adrenal glands are within normal limits. The kidneys demonstrate no definitive renal calculi or obstructive changes. The bladder is partially decompressed. Stomach/Bowel: Changes of diverticulosis are seen within the colon without evidence of diverticulitis. No obstructive or inflammatory changes are noted. The appendix is within normal limits. No small bowel abnormality is seen. The stomach again demonstrates a large hiatal hernia with wall thickening relatively stable from the prior exam. Vascular/Lymphatic: Aortic atherosclerosis. No enlarged  abdominal or pelvic lymph nodes. Reproductive: Status post hysterectomy. No adnexal masses. Other: No abdominal wall hernia or abnormality. No abdominopelvic ascites. Considerable changes of anasarca are again identified within the abdominal wall stable from the prior study. Some laxity of the pelvic floor is noted and stable. Musculoskeletal: Postsurgical and degenerative changes are noted stable from the prior study. IMPRESSION: Large hiatal hernia stable from the prior exam. Some wall thickening in the stomach is noted suspicious for gastritis. No obstructive changes are noted. Chronic changes similar to that seen on the prior exam. Changes of anasarca stable from the previous study. Electronically Signed   By: Inez Catalina M.D.   On: 05/31/2019 20:34   Dg Chest Portable 1 View  Result Date: 06/15/2019 CLINICAL DATA:  Dyspnea EXAM: PORTABLE CHEST 1 VIEW COMPARISON:  05/31/2019, 05/08/2016, 07/12/2018 FINDINGS: Right-sided central venous port tip over the SVC. Retrocardiac opacity and lucency compatible with large hiatal hernia. Scarring at the bases. Stable cardiomediastinal silhouette. IMPRESSION: No active disease. Similar appearance of large hiatal hernia, mild cardiomegaly, and basilar scarring Electronically Signed   By: Donavan Foil M.D.   On: 06/15/2019 14:25   Dg Chest Portable 1 View  Result Date: 05/31/2019 CLINICAL DATA:   Cough EXAM: PORTABLE CHEST 1 VIEW COMPARISON:  Radiographs 07/12/2018, CT chest 07/16/2006, same-day CT abdomen and pelvis FINDINGS: Irregularity along the right heart border likely corresponds to the large hiatal hernia seen on CT of the abdomen pelvis. Hazy areas of basilar atelectasis are present. Additional increased attenuation lung bases likely related to body habitus. No pneumothorax or visible effusion. A right IJ approach Port-A-Cath tip terminates in the mid SVC. There is cardiomegaly which is similar to slightly increased from prior exam though may be accentuated by the portable technique. Slightly lobular configuration of the heart could also reflect small pericardial effusion. The aorta is tortuous. No acute osseous or soft tissue abnormality. Degenerative changes are present in the imaged spine and shoulders. IMPRESSION: 1. Irregularity along the right heart border likely corresponds to the large hiatal hernia seen on CT of the abdomen pelvis. 2. Bibasilar atelectasis. No other acute cardiopulmonary abnormality. Increased attenuation of the bases likely related to body habitus. 3. Slightly lobular configuration of the heart could reflect the small volume pericardial fluid seen on CT abdomen pelvis. Electronically Signed   By: Lovena Le M.D.   On: 05/31/2019 20:46      Subjective: Pt without complaints, denies SOB and chest pain.   Discharge Exam: Vitals:   06/21/19 0503 06/21/19 0923  BP: 121/62   Pulse: 75   Resp: 16   Temp: 97.9 F (36.6 C)   SpO2: 98% 96%   Vitals:   06/20/19 1444 06/20/19 2145 06/21/19 0503 06/21/19 0923  BP: 97/79 (!) 138/57 121/62   Pulse: 69 75 75   Resp: 17 20 16    Temp: 97.7 F (36.5 C) 98.5 F (36.9 C) 97.9 F (36.6 C)   TempSrc: Oral Oral Oral   SpO2: 96% 95% 98% 96%  Weight:   104.2 kg   Height:       General exam: awake, alert, NAD, cooperative.  Respiratory system: Clear. No increased work of breathing. Cardiovascular system: normal S1  & S2 heard. No JVD, murmurs, gallops, clicks or pedal edema. Gastrointestinal system: Abdomen is nondistended, soft and nontender. Normal bowel sounds heard. Central nervous system: Alert and oriented. No focal neurological deficits. Extremities: 1+ edema BLEs unchanged.   The results of significant diagnostics from this hospitalization (including  imaging, microbiology, ancillary and laboratory) are listed below for reference.     Microbiology: Recent Results (from the past 240 hour(s))  SARS CORONAVIRUS 2 (TAT 6-24 HRS) Nasopharyngeal Nasopharyngeal Swab     Status: None   Collection Time: 06/15/19  1:47 PM   Specimen: Nasopharyngeal Swab  Result Value Ref Range Status   SARS Coronavirus 2 NEGATIVE NEGATIVE Final    Comment: (NOTE) SARS-CoV-2 target nucleic acids are NOT DETECTED. The SARS-CoV-2 RNA is generally detectable in upper and lower respiratory specimens during the acute phase of infection. Negative results do not preclude SARS-CoV-2 infection, do not rule out co-infections with other pathogens, and should not be used as the sole basis for treatment or other patient management decisions. Negative results must be combined with clinical observations, patient history, and epidemiological information. The expected result is Negative. Fact Sheet for Patients: SugarRoll.be Fact Sheet for Healthcare Providers: https://www.woods-mathews.com/ This test is not yet approved or cleared by the [place] FDA and  has been authorized for detection and/or diagnosis of SARS-CoV-2 by FDA under an Emergency Use Authorization (EUA). This EUA will remain  in effect (meaning this test can be used) for the duration of the COVID-19 declaration under Section 56 4(b)(1) of the Act, 21 U.S.C. section 360bbb-3(b)(1), unless the authorization is terminated or revoked sooner. Performed at [facility], [place] [address] [place], [place] [address]       Labs: BNP (last 3 results) Recent Labs    07/12/18 1335 05/31/19 1658 06/15/19 1347  BNP 136.0* 206.0* 323.5*   Basic Metabolic Panel: Recent Labs  Lab 06/16/19 0730 06/17/19 0916 06/18/19 0505 06/19/19 0658 06/20/19 0641  NA 139 141 142 142 140  K 4.1 4.1 3.9 3.9 4.0  CL 98 96* 97* 95* 95*  CO2 28 31 33* 33* 33*  GLUCOSE 166* 122* 118* 117* 103*  BUN 63* 65* 65* 65* 62*  CREATININE 1.98* 2.63* 2.60* 2.44* 2.24*  CALCIUM 8.1* 8.1* 7.9* 8.2* 8.1*  MG  --  1.7 1.5*  --   --    Liver Function Tests: No results for input(s): AST, ALT, ALKPHOS, BILITOT, PROT, ALBUMIN in the last 168 hours. No results for input(s): LIPASE, AMYLASE in the last 168 hours. No results for input(s): AMMONIA in the last 168 hours. CBC: Recent Labs  Lab 06/15/19 1340 06/16/19 0730  WBC 10.7* 12.7*  HGB 11.2* 10.6*  HCT 36.8 35.6*  MCV 88.5 90.1  PLT 250 231   Cardiac Enzymes: No results for input(s): CKTOTAL, CKMB, CKMBINDEX, TROPONINI in the last 168 hours. BNP: Invalid input(s): POCBNP CBG: Recent Labs  Lab 06/20/19 0736 06/20/19 1104 06/20/19 1601 06/20/19 2143 06/21/19 0742  GLUCAP 116* 178* 147* 170* 100*   D-Dimer No results for input(s): DDIMER in the last 72 hours. Hgb A1c No results for input(s): HGBA1C in the last 72 hours. Lipid Profile No results for input(s): CHOL, HDL, LDLCALC, TRIG, CHOLHDL, LDLDIRECT in the last 72 hours. Thyroid function studies No results for input(s): TSH, T4TOTAL, T3FREE, THYROIDAB in the last 72 hours.  Invalid input(s): FREET3 Anemia work up No results for input(s): VITAMINB12, FOLATE, FERRITIN, TIBC, IRON, RETICCTPCT in the last 72 hours. Urinalysis    Component Value Date/Time   COLORURINE STRAW (A) 05/31/2019 [place] 05/31/2019 1605   LABSPEC 1.006 05/31/2019 1605   PHURINE 6.0 05/31/2019 1605   GLUCOSEU NEGATIVE 05/31/2019 1605   HGBUR NEGATIVE 05/31/2019 [place] 05/31/2019 1605  KETONESUR NEGATIVE 05/31/2019 1605   PROTEINUR NEGATIVE 05/31/2019 1605   UROBILINOGEN 0.2 01/29/2007 1500   NITRITE NEGATIVE 05/31/2019 1605   LEUKOCYTESUR NEGATIVE 05/31/2019 1605   Sepsis Labs Invalid input(s): PROCALCITONIN,  WBC,  LACTICIDVEN Microbiology Recent Results (from the past 240 hour(s))  SARS CORONAVIRUS 2 (TAT 6-24 HRS) Nasopharyngeal Nasopharyngeal Swab     Status: None   Collection Time: 06/15/19  1:47 PM   Specimen: Nasopharyngeal Swab  Result Value Ref Range Status   SARS Coronavirus 2 NEGATIVE NEGATIVE Final    Comment: (NOTE) SARS-CoV-2 target nucleic acids are NOT DETECTED. The SARS-CoV-2 RNA is generally detectable in upper and lower respiratory specimens during the acute phase of infection. Negative results do not preclude SARS-CoV-2 infection, do not rule out co-infections with other pathogens, and should not be used as the sole basis for treatment or other patient management decisions. Negative results must be combined with clinical observations, patient history, and epidemiological information. The expected result is Negative. Fact Sheet for Patients: SugarRoll.be Fact Sheet for Healthcare Providers: https://www.woods-mathews.com/ This test is not yet approved or cleared by the [place] FDA and  has been authorized for detection and/or diagnosis of SARS-CoV-2 by FDA under an Emergency Use Authorization (EUA). This EUA will remain  in effect (meaning this test can be used) for the duration of the COVID-19 declaration under Section 56 4(b)(1) of the Act, 21 U.S.C. section 360bbb-3(b)(1), unless the authorization is terminated or revoked sooner. Performed at [facility], [place] [address], [place], [place] [address]    Time coordinating discharge: 35 minutes   SIGNED:  Irwin Brakeman, MD  [facility] 06/21/2019, 12:42 PM How to contact the [facility] Attending or Consulting provider [place] or  covering provider during after hours [place], for this patient?  1. Check the care team in [facility] and look for a) attending/consulting [facility] provider listed and b) the [facility] team listed 2. Log into [email or web address] and use [place]'s universal password to access. If you do not have the password, please contact the hospital operator. 3. Locate the [facility] provider you are looking for under [facility] and page to a number that you can be directly reached. 4. If you still have difficulty reaching the provider, please page the [facility] (Director on Call) for the Hospitalists listed on amion for assistance.

## 2019-06-21 NOTE — Discharge Instructions (Signed)
Bleeding Precautions When on Anticoagulant Therapy, Adult °Anticoagulant therapy, also called blood thinner therapy, is medicine that helps to prevent and treat blood clots. The medicine works by stopping blood clots from forming or growing. Blood clots that form in your blood vessels can be dangerous. They can break loose and travel to the heart, lungs, or brain. This increases the risk of a heart attack, stroke, or blocked lung artery (pulmonary embolism). °Anticoagulants also increase the risk of bleeding. Try to protect yourself from cuts and other injuries that can cause bleeding. It is important to take anticoagulants exactly as told by your health care provider. °Why do I need to be on anticoagulant therapy? °You may need this medicine if you are at risk of developing a blood clot. Conditions that increase your risk of a blood clot include: °· Being born with heart disease or a heart malformation (congenital heart disease). °· Developing heart disease. °· Having had surgery, such as valve replacement. °· Having had a serious accident or other type of severe injury (trauma). °· Having certain types of cancer. °· Having certain diseases that can increase blood clotting. °· Having a high risk of stroke or heart attack. °· Having atrial fibrillation (AF). °What are the common anticoagulant medicines? °There are several types of anticoagulant medicines. The most common types are: °· Medicines that you take by mouth (oral medicines), such as: °? Warfarin. °? Novel oral anticoagulants (NOACs), such as: °? Direct thrombin inhibitors (dabigatran). °? Factor Xa inhibitors (apixaban, edoxaban, and rivaroxaban). °· Injections, such as: °? Unfractionated heparin. °? Low molecular weight heparin. °These anticoagulants work in different ways to prevent blood clots. They also have different risks and side effects. °What do I need to remember while on anticoagulant therapy? °Taking anticoagulants °· Take your medicine at the  same time every day. If you forget to take your medicine, take it as soon as you remember. Do not double your dosage of medicine if you miss a whole day. Take your normal dose and call your health care provider. °· Do not stop taking your medicine unless your health care provider approves. Stopping the medicine can increase your risk of developing a blood clot. °Taking other medicines °· Take over-the-counter and prescriptions medicines only as told by your health care provider. °· Do not take over-the-counter NSAIDs, including aspirin and ibuprofen, while you are on anticoagulant therapy. These medicines increase your risk of dangerous bleeding. °· Get approval from your health care provider before you start taking any new medicines, vitamins, or herbal products. Some of these could interfere with your therapy. °General instructions °· Keep all follow-up visits as told by your health care provider. This is important. °· If you are pregnant or trying to get pregnant, talk with a health care provider about anticoagulants. Some of these medicines are not safe to take during pregnancy. °· Tell all health care providers, including your dentist, that you are on anticoagulant therapy. It is especially important to tell providers before you have any surgery, medical procedures, or dental work done. °What precautions should I take? ° °· Be very careful when using knives, scissors, or other sharp objects. °· Use an electric razor instead of a blade. °· Do not use toothpicks. °· Use a soft-bristled toothbrush. Brush your teeth gently. °· Always wear shoes outdoors and wear slippers indoors. °· Be careful when cutting your fingernails and toenails. °· Place bath mats in the bathroom. If possible, install handrails as well. °· Wear gloves while you do   yard work. °· Wear your seat belt. °· Prevent falls by removing loose rugs and extension cords from areas where you walk. Use a cane or walker if you need it. °· Avoid  constipation by: °? Drinking enough fluid to keep your urine clear or pale yellow. °? Eating foods that are high in fiber, such as fresh fruits and vegetables, whole grains, and beans. °? Limiting foods that are high in fat and processed sugars, such as fried and sweet foods. °· Do not play contact sports or participate in other activities that have a high risk for injury. °What other precautions are important if on warfarin therapy? °If you are taking a type of anticoagulant called warfarin, make sure you: °· Work with a diet and nutrition specialist (dietitian) to make an eating plan. Do not make any sudden changes to your diet after you have started your eating plan. °· Do not drink alcohol. It can interfere with your medicine and increase your risk of an injury that causes bleeding. °· Get regular blood tests as told by your health care provider. °What are some questions to ask my health care provider? °· Why do I need anticoagulant therapy? °· What is the best anticoagulant therapy for my condition? °· How long will I need anticoagulant therapy? °· What are the side effects of anticoagulant therapy? °· When should I take my medicine? What should I do if I forget to take it? °· Will I need to have regular blood tests? °· Do I need to change my diet? Are there foods or drinks that I should avoid? °· What activities are safe for me? °· What should I do if I want to get pregnant? °Contact a health care provider if: °· You miss a dose of medicine: °? And you are not sure what to do. °? For more than one day. °· You have: °? Menstrual bleeding that is heavier than normal. °? Bloody or brown urine. °? Easy bruising. °? Black and tarry stool or bright red stool. °? Side effects from your medicine. °· You feel weak or dizzy. °· You become pregnant. °Get help right away if: °· You have bleeding that will not stop within 20 minutes from: °? The nose. °? The gums. °? A cut on the skin. °· You have a severe headache or  stomachache. °· You vomit or cough up blood. °· You fall or hit your head. °Summary °· Anticoagulant therapy, also called blood thinner therapy, is medicine that helps to prevent and treat blood clots. °· Anticoagulants work in different ways to prevent blood clots. They also have different risks and side effects. °· Talk with your health care provider about any precautions that you should take while on anticoagulant therapy. °This information is not intended to replace advice given to you by your health care provider. Make sure you discuss any questions you have with your health care provider. °Document Released: 07/03/2015 Document Revised: 11/11/2018 Document Reviewed: 10/08/2016 °Elsevier Patient Education © 2020 [employer] Inc. ° °

## 2019-06-21 NOTE — Progress Notes (Signed)
Right upper chest port was de-assessed and 2x2 gauze and tegaderm applied to site, patient tolerated well. [facility] not able to take patient with port accessed.

## 2019-06-21 NOTE — TOC Transition Note (Signed)
Transition of Care [facility]) - CM/SW Discharge Note   Patient Details  Name: [name] MRN: [id number] Date of Birth: 1946/03/13  Transition of Care [facility]) CM/SW Contact:  Shade Flood, LCSW Phone Number: 06/21/2019, 12:55 PM   Clinical Narrative:     Pt stable for dc to SNF today. Pt has selected [facility] for rehab. Updated Kerri at [facility]. They can take pt today. DC clinical sent electronically. Updated pt's RN who will call report when pt ready.  There are no other TOC needs for dc.  Final next level of care: Skilled Nursing Facility Barriers to Discharge: Barriers Resolved   Patient Goals and CMS Choice Patient states their goals for this hospitalization and ongoing recovery are:: to return home      Discharge Placement                       Discharge Plan and Services In-house Referral: Clinical Social Work Discharge Planning Services: CM Consult              DME Agency: [employer]     Representative spoke with at DME Agency: [place] ([place]) Interventions     Readmission Risk Interventions Readmission Risk Prevention Plan 06/21/2019 06/18/2019 06/08/2019  Transportation Screening - Complete Complete  PCP or Specialist Appt within 3-5 Days Not Complete - Complete  Not Complete comments Pt goign to SNF - -  [facility] or [place] - - Not Complete  [facility]RI or Home Care Consult comments - - patient declining home health  Social Work Consult for [place] Planning/Counseling - Complete Complete  Palliative Care Screening - Not Applicable Not Applicable  Medication Review Press photographer) - Complete Complete  Some recent data might be hidden

## 2019-06-22 ENCOUNTER — Encounter: Payer: Self-pay | Admitting: Adult Health

## 2019-06-22 ENCOUNTER — Non-Acute Institutional Stay (SKILLED_NURSING_FACILITY): Payer: Medicare Other | Admitting: Adult Health

## 2019-06-22 DIAGNOSIS — N183 Chronic kidney disease, stage 3 unspecified: Secondary | ICD-10-CM

## 2019-06-22 DIAGNOSIS — E1140 Type 2 diabetes mellitus with diabetic neuropathy, unspecified: Secondary | ICD-10-CM

## 2019-06-22 DIAGNOSIS — I5032 Chronic diastolic (congestive) heart failure: Secondary | ICD-10-CM | POA: Diagnosis not present

## 2019-06-22 DIAGNOSIS — K2100 Gastro-esophageal reflux disease with esophagitis, without bleeding: Secondary | ICD-10-CM

## 2019-06-22 DIAGNOSIS — J9611 Chronic respiratory failure with hypoxia: Secondary | ICD-10-CM

## 2019-06-22 DIAGNOSIS — E1122 Type 2 diabetes mellitus with diabetic chronic kidney disease: Secondary | ICD-10-CM

## 2019-06-22 DIAGNOSIS — E1142 Type 2 diabetes mellitus with diabetic polyneuropathy: Secondary | ICD-10-CM

## 2019-06-22 DIAGNOSIS — G4733 Obstructive sleep apnea (adult) (pediatric): Secondary | ICD-10-CM

## 2019-06-22 DIAGNOSIS — Z9989 Dependence on other enabling machines and devices: Secondary | ICD-10-CM

## 2019-06-22 DIAGNOSIS — I130 Hypertensive heart and chronic kidney disease with heart failure and stage 1 through stage 4 chronic kidney disease, or unspecified chronic kidney disease: Secondary | ICD-10-CM

## 2019-06-22 DIAGNOSIS — E1169 Type 2 diabetes mellitus with other specified complication: Secondary | ICD-10-CM

## 2019-06-22 DIAGNOSIS — E782 Mixed hyperlipidemia: Secondary | ICD-10-CM | POA: Insufficient documentation

## 2019-06-22 DIAGNOSIS — K449 Diaphragmatic hernia without obstruction or gangrene: Secondary | ICD-10-CM

## 2019-06-22 DIAGNOSIS — I4892 Unspecified atrial flutter: Secondary | ICD-10-CM | POA: Diagnosis not present

## 2019-06-22 DIAGNOSIS — E785 Hyperlipidemia, unspecified: Secondary | ICD-10-CM

## 2019-06-22 DIAGNOSIS — D500 Iron deficiency anemia secondary to blood loss (chronic): Secondary | ICD-10-CM

## 2019-06-22 DIAGNOSIS — M1A30X0 Chronic gout due to renal impairment, unspecified site, without tophus (tophi): Secondary | ICD-10-CM | POA: Insufficient documentation

## 2019-06-22 DIAGNOSIS — J449 Chronic obstructive pulmonary disease, unspecified: Secondary | ICD-10-CM

## 2019-06-22 DIAGNOSIS — N1832 Chronic kidney disease, stage 3b: Secondary | ICD-10-CM

## 2019-06-22 NOTE — Care Management Important Message (Signed)
Important Message  Patient Details  Name: [name] MRN: [id number] Date of Birth: 20-Mar-1946   Medicare Important Message Given:  Yes     Tommy Medal 06/22/2019, 3:24 PM

## 2019-06-22 NOTE — Progress Notes (Signed)
Location:    [facility] Room Number: 159/P Place of Service:  SNF (31)   CODE STATUS: Full Code  No Known Allergies  Chief Complaint  Patient presents with  . Hospitalization Follow-up    HPI:  She is a 73 year old woman who has been hospitalized from 06-15-19 through 06-21-19. She had been hospitalized recently for heart failure. She returned to the hospital with atrial flutter with RVR: and heart failure. She is here for short term rehab with her goal to return back home. She does have a productive cough with tan/brown sputum. She does have some fatigue present and has a poor appetite. She continues to be followed for her chronic illnesses including: chf; diabetes; gout.   Past Medical History:  Diagnosis Date  . Blood transfusion without reported diagnosis   . CAD (coronary artery disease)    Nonobstructive at cardiac catheterization 2000  . Cataract   . Cervical cancer ([place]) 1978  . CHF (congestive heart failure) ([place])   . Chronic back pain   . Chronic kidney disease   . COPD (chronic obstructive pulmonary disease) ([place])   . Degenerative disc disease   . Essential hypertension, benign   . GERD (gastroesophageal reflux disease)   . Gout   . Hiatal hernia 07/27/2013  . History of diverticulitis of colon   . History of hiatal hernia   . Iron deficiency anemia   . Irritable bowel syndrome   . Lumbar radiculopathy   . Mixed hyperlipidemia   . Neuropathy   . Osteoporosis   . Ovarian cancer [facility]) 1978   patient denies. States this was cervical cancer  . Oxygen deficiency   . Sleep apnea   . Type 2 diabetes mellitus ([place])   . Vitamin B deficiency 12/25/2009  . Vitamin B12 deficiency     Past Surgical History:  Procedure Laterality Date  . ABDOMINAL HYSTERECTOMY    . BACK SURGERY    . Benign breast cysts    . CHOLECYSTECTOMY    . COLONOSCOPY  10/01/2006   SLF:Pan colonic diverticulosis and moderate internal hemorrhoids/ Otherwise no polyps,  masses, inflammatory changes or AVMs/  . COLONOSCOPY  2011   SLF: pancolonic diverticulosis, large internal hemorrhoids  . COLONOSCOPY N/A 01/26/2016   Procedure: COLONOSCOPY;  Surgeon: [name], MD;  Location: [facility];  Service: Endoscopy;  Laterality: N/A;  830   . ESOPHAGOGASTRODUODENOSCOPY  11/19/2006   SLF: Large hiatal hernia without evidence of Cameron ulcers/. Distal esophageal stricture, which allowed the gastroscope to pass without resistance.  A 16 mm Savary later passed with mild resistance/ Normal stomach.sb bx negative  . ESOPHAGOGASTRODUODENOSCOPY  10/01/2006   ZSW:FUXNA hiatal hernia.  Distal esophagus without evidence of   erythema, ulceration or Barrett's esophagus  . ESOPHAGOGASTRODUODENOSCOPY  2011   SLF: large hh, distal esophageal web narrowing to 89mm s/p dilation to 75mm  . ESOPHAGOGASTRODUODENOSCOPY N/A 08/06/2013   SLF: 1. Stricture at the gastroesophageal junction 2. large hiatal hernia. 3. Mild erosive gastritis.  Marland Kitchen GIVENS CAPSULE STUDY N/A 08/06/2013   INCOMPLETE-SMALL BOWLE ULCERS  . KNEE SURGERY Right   . PARTIAL HYSTERECTOMY  1978  . small bowel capsule  2008   negative  . SPINE SURGERY    . TONSILLECTOMY AND ADENOIDECTOMY    . Two back surgeries/fusion    . UMBILICAL HERNIA REPAIR  2010    Social History   Socioeconomic History  . Marital status: Widowed    Spouse name: Shilling  .  Number of children: 4  . Years of education: Not on file  . Highest education level: 10th grade  Occupational History  . 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 Financial resource strain: Not on file  . Food insecurity    Worry: Not on file    Inability: Not on file  . Transportation needs    Medical: Not on file    Non-medical: Not on file  Tobacco Use  . Smoking status: Former Smoker    Packs/day: 1.00    Years: 1.00    Pack years: 1.00    Types: Cigarettes    Start date: 02/19/1961    Quit date: 08/05/1961    Years  since quitting: 57.9  . Smokeless tobacco: Never Used  . Tobacco comment: Quit smoking x 50 years  Substance and Sexual Activity  . Alcohol use: No  . Drug use: No  . Sexual activity: Not Currently  Lifestyle  . Physical activity    Days per week: Not on file    Minutes per session: Not on file  . Stress: Not on file  Relationships  . Social Herbalist on phone: Not on file    Gets together: Not on file    Attends religious service: Not on file    Active member of club or organization: Not on file    Attends meetings of clubs or organizations: Not on file    Relationship status: Not on file  . Intimate partner violence    Fear of current or ex partner: Not on file    Emotionally abused: Not on file    Physically abused: Not on file    Forced sexual activity: Not on file  Other Topics Concern  . Not on file  Social History Narrative   HAS 4 SON-GRAND [place].   Lives in home with Linzy - married 13 Y   Cook, sew, quilt, chochet   Family History  Problem Relation Age of Onset  . Colon cancer Brother        diagnosed age 6. Living.   Marland Kitchen Ulcers Sister   . Diabetes Sister   . Heart attack Sister   . Kidney failure Sister   . Stroke Sister   . Ulcers Mother   . Diabetes Mother   . Heart attack Mother   . Stroke Mother   . Asthma Mother   . Heart disease Mother   . Cervical cancer Mother   . Heart attack Brother   . Heart disease Brother   . Asthma Sister   . Diabetes Brother   . Stroke Maternal Grandmother   . Heart attack Maternal Grandmother   . Heart attack Other   . Early death Father        MVA in his 64s      VITAL SIGNS BP 115/70   Pulse 81   Temp (!) 96.6 F (35.9 C) (Oral)   Resp 20   Ht 4\' 8"  (1.422 m)   Wt 222 lb 8 oz (100.9 kg)   BMI 49.88 kg/m   Outpatient Encounter Medications as of 06/22/2019  Medication Sig  . albuterol (PROVENTIL) (2.5 MG/3ML) 0.083% nebulizer solution Take 2.5 mg by nebulization every 6  (six) hours as needed for wheezing or shortness of breath.   Marland Kitchen albuterol (VENTOLIN HFA) 108 (90 Base) MCG/ACT inhaler Inhale 2 puffs into the lungs every 6 (six) hours as needed  for wheezing or shortness of breath.   . allopurinol (ZYLOPRIM) 100 MG tablet Take 100 mg by mouth 2 (two) times daily.   Marland Kitchen amitriptyline (ELAVIL) 100 MG tablet Take 100 mg by mouth at bedtime.    Marland Kitchen apixaban (ELIQUIS) 5 MG TABS tablet Take 1 tablet (5 mg total) by mouth 2 (two) times daily.  Marland Kitchen aspirin EC 81 MG tablet Take 81 mg by mouth daily.  Marland Kitchen atorvastatin (LIPITOR) 40 MG tablet Take 1 tablet (40 mg total) by mouth at bedtime.  Marland Kitchen BIOTIN PO Take 1 capsule by mouth daily.  Marland Kitchen diltiazem (CARDIZEM CD) 120 MG 24 hr capsule Take 1 capsule (120 mg total) by mouth daily.  Marland Kitchen esomeprazole (NEXIUM) 40 MG capsule TAKE 1 CAPSULE TWICE A DAY 30 MINUTES PRIOR TO MEALS  . famotidine (PEPCID) 40 MG tablet TAKE 1 TABLET AT BEDTIME AS NEEDED TO CONTROL REFLUX  . guaiFENesin (MUCINEX) 600 MG 12 hr tablet Take 1 tablet (600 mg total) by mouth 2 (two) times daily.  . hydrALAZINE (APRESOLINE) 25 MG tablet Take 25 mg by mouth 2 (two) times daily.  . insulin lispro (HUMALOG KWIKPEN) 100 UNIT/ML KwikPen Inject 5 Units into the skin 3 (three) times daily.  Marland Kitchen LANTUS SOLOSTAR 100 UNIT/ML SOPN Inject 16 Units into the skin at bedtime.   Marland Kitchen loperamide (IMODIUM) 2 MG capsule Take 2 mg by mouth every 6 (six) hours as needed for diarrhea or loose stools.   . metolazone (ZAROXOLYN) 2.5 MG tablet Take 1 tablet (2.5 mg total) by mouth every Monday, Wednesday, and Friday.  . metoprolol succinate (TOPROL-XL) 25 MG 24 hr tablet Take 25 mg by mouth daily. Along with 50 mg to = 75 mg  . metoprolol succinate (TOPROL-XL) 50 MG 24 hr tablet Take 50 mg by mouth daily. Along with 25 mg to = 75 mg  . NON FORMULARY Diet; Regular, NAS, Consistent Carbohydrate  . OXYGEN Inhale 2 L into the lungs. Every Shift  . torsemide (DEMADEX) 10 MG tablet Take 3 tablets (30 mg  total) by mouth 2 (two) times daily.  . TRELEGY ELLIPTA 100-62.5-25 MCG/INH AEPB Inhale 1 puff into the lungs daily.  . [DISCONTINUED] hydrALAZINE (APRESOLINE) 25 MG tablet Take 1 tablet (25 mg total) by mouth 2 (two) times daily.  . [DISCONTINUED] insulin lispro (HUMALOG) 100 UNIT/ML KiwkPen Inject 14-16 Units into the skin daily as needed (per sliding scale for high blood sugars starting at 120).   . [DISCONTINUED] metoprolol succinate (TOPROL-XL) 50 MG 24 hr tablet Take 75 mg by mouth daily. Take with or immediately following a meal. (Patient taking differently: Take 50 mg by mouth daily. Along with 25 mg to = 75 mg)   No facility-administered encounter medications on file as of 06/22/2019.      SIGNIFICANT DIAGNOSTIC EXAMS  TODAY;  05-31-19: ct of abdomen and pelvis:  Large hiatal hernia stable from the prior exam. Some wall thickening in the stomach is noted suspicious for gastritis. No obstructive changes are noted. Chronic changes similar to that seen on the prior exam. Changes of anasarca stable from the previous study.  05-31-19: chest x-ray:  1. Irregularity along the right heart border likely corresponds to the large hiatal hernia seen on CT of the abdomen pelvis. 2. Bibasilar atelectasis. No other acute cardiopulmonary abnormality. Increased attenuation of the bases likely related to body habitus. 3. Slightly lobular configuration of the heart could reflect the small volume pericardial fluid seen on CT abdomen pelvis.  06-15-19: chest x-ray;  No active disease. Similar appearance of large hiatal hernia, mild cardiomegaly, and basilar scarring  06-16-19: 2-echo:   1. Left ventricular ejection fraction, by visual estimation, is 55 to 60%. The left ventricle has normal function. There is no left ventricular hypertrophy.  2. Elevated left atrial pressure.  3. Left ventricular diastolic parameters are consistent with Grade II diastolic dysfunction (pseudonormalization).  4.  Global right ventricle has normal systolic function.The right ventricular size is normal. No increase in right ventricular wall thickness.  5. Left atrial size was moderately dilated.  6. Right atrial size was normal.  7. The mitral valve was not well visualized. Trace mitral valve regurgitation. No evidence of mitral stenosis.  8. The tricuspid valve is normal in structure. Tricuspid valve regurgitation is mild.  9. The aortic valve was not well visualized. Aortic valve regurgitation is not visualized. No evidence of aortic valve sclerosis or stenosis. 10. The pulmonic valve was not well visualized. Pulmonic valve regurgitation not assessed. 11. The aortic root was not well visualized. 12. Moderately elevated pulmonary artery systolic pressure. 13. Moderate pulmonary HTN, PASP is 48 mmHg. 14. The inferior vena cava is normal in size with greater than 50% respiratory variability, suggesting right atrial pressure of 3 mmHg. 15. The interatrial septum was not well visualized.   LABS REVIEWED:   06-15-19: wbc 10.7; hgb 11.2; hct 36.8; mcv 88.5 plt 250; glucose 181; bun 64; creat 2.05; k+ 3.9; na++ 138; ca 8.2; BNP 327.0 06-17-19: glucose 122; bun 65; creat 1.263; k+ 4.1; na++ 141; ca 8.1; mag 1.6 06-19-19: glucose 117; bun 65; creat 2.44; k+ 3.9; na++ 142; ca 8.2    Review of Systems  Constitutional:       Has little energy  Appetite is poor   Respiratory: Positive for cough, sputum production and shortness of breath.   Cardiovascular: Negative for chest pain, palpitations and leg swelling.  Gastrointestinal: Negative for abdominal pain, constipation and heartburn.  Musculoskeletal: Negative for back pain, joint pain and myalgias.  Skin: Negative.   Neurological: Negative for dizziness.  Psychiatric/Behavioral: The patient is not nervous/anxious.      Physical Exam Constitutional:      General: She is not in acute distress.    Appearance: She is well-developed. She is morbidly obese.  She is not diaphoretic.  Neck:     Musculoskeletal: Neck supple.     Thyroid: No thyromegaly.  Cardiovascular:     Rate and Rhythm: Normal rate and regular rhythm.     Pulses: Normal pulses.     Heart sounds: Normal heart sounds.  Pulmonary:     Effort: Pulmonary effort is normal. No respiratory distress.     Breath sounds: Wheezing present.     Comments: 02 dependent Using CPAP at night Has few scattered wheezes bilateral lower lobes Abdominal:     General: Bowel sounds are normal. There is no distension.     Palpations: Abdomen is soft.     Tenderness: There is no abdominal tenderness.  Musculoskeletal:     Right lower leg: Edema present.     Left lower leg: Edema present.     Comments: 2 + bilateral lower extremity edema Is able to move all extremities   Lymphadenopathy:     Cervical: No cervical adenopathy.  Skin:    General: Skin is warm and dry.     Comments: Bilateral lower extremities discolored   Neurological:     Mental Status: She is alert and oriented to person, place, and time.  ASSESSMENT/ PLAN:  TODAY;   1. Chronic diastolic CHF (congestive heart failure) is stable EF 55-60% (06-16-19) will continue demadex 30 mg twice daily; zaroxolyn 2.5 mg three times weekly; apresoline 25 mg twice daily toprol xl 75 mg daily   2.  Atrial flutter with rapid ventricular response: heart rate is stable has converted to NSR; will continue cardizem cd 120 mg daily ; toprol xl 75 mg daily for rate control; and eliquis 5 mg twice daily   3. 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 OSA on CPAP: is stable is on CPAP nightly   5. Obstructive chronic  bronchitis without exacerbation/chronic respiratory failure with hypoxia: is without  is 02 dependent; will continue trelegy 100/62.5/25  mcg 1 puff daily; has albuterol 2 puffs/neb every 6 hours as needed  Will increase mucinex to 1200 mg twice daily and will use I/S every 4 hours will monitor her status.   6. Hiatal hernia with GERD without esophagitis: is stable will continue nexium 40 mg twice daily and has pepcid 40 mg daily as needed  7. DM type 2 with peripheral neuropathy: is stable will continue humalog 5 units with meals and lantus 16 units nightly   8. CKD stage 3 due to type 2  diabetes mellitus: is stable bun 65 creat 2.44 will monitor  9. Neuropathy due to type 2 diabetes mellitus: is stable will continue elavil 100 mg nightly   10. 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 Iron deficiency anemia due to chronic blood loss: is stable hgb 11.2 will monitor  12. Dyslipidemia associated with type 2 diabetes mellitus: is stable will continue lipitor 40 mg daily   13. Chronic gout 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 unspecified site: is stable no recent flares will continue allopurinol 100 mg twice daily   Will check cbc; bmp on 06-28-19.      MD is aware of resident's narcotic use and is in agreement with current plan of care. We will attempt to wean resident as appropriate.  Ok Edwards NP [facility]  Contact [phone] Monday through Friday 8am- 5pm  After hours call [phone]

## 2019-06-23 ENCOUNTER — Non-Acute Institutional Stay (SKILLED_NURSING_FACILITY): Payer: Medicare Other | Admitting: Internal Medicine

## 2019-06-23 ENCOUNTER — Encounter: Payer: Self-pay | Admitting: Internal Medicine

## 2019-06-23 DIAGNOSIS — J9611 Chronic respiratory failure with hypoxia: Secondary | ICD-10-CM | POA: Diagnosis not present

## 2019-06-23 DIAGNOSIS — J449 Chronic obstructive pulmonary disease, unspecified: Secondary | ICD-10-CM | POA: Diagnosis not present

## 2019-06-23 DIAGNOSIS — I5032 Chronic diastolic (congestive) heart failure: Secondary | ICD-10-CM

## 2019-06-23 DIAGNOSIS — N1832 Chronic kidney disease, stage 3b: Secondary | ICD-10-CM

## 2019-06-23 DIAGNOSIS — I4891 Unspecified atrial fibrillation: Secondary | ICD-10-CM | POA: Diagnosis not present

## 2019-06-23 DIAGNOSIS — I5033 Acute on chronic diastolic (congestive) heart failure: Secondary | ICD-10-CM

## 2019-06-23 DIAGNOSIS — I130 Hypertensive heart and chronic kidney disease with heart failure and stage 1 through stage 4 chronic kidney disease, or unspecified chronic kidney disease: Secondary | ICD-10-CM

## 2019-06-23 DIAGNOSIS — E785 Hyperlipidemia, unspecified: Secondary | ICD-10-CM

## 2019-06-23 DIAGNOSIS — E1142 Type 2 diabetes mellitus with diabetic polyneuropathy: Secondary | ICD-10-CM

## 2019-06-23 DIAGNOSIS — E1169 Type 2 diabetes mellitus with other specified complication: Secondary | ICD-10-CM

## 2019-06-23 DIAGNOSIS — M25522 Pain in left elbow: Secondary | ICD-10-CM

## 2019-06-23 DIAGNOSIS — M1A30X0 Chronic gout due to renal impairment, unspecified site, without tophus (tophi): Secondary | ICD-10-CM

## 2019-06-23 NOTE — Progress Notes (Signed)
: Provider:  Hennie Duos., MD Location:  [facility] Room Number: 159-P Place of Service:  SNF (31)  PCP: [name], MD Patient Care Team: [name], MD as PCP - General (Family Medicine) Satira Sark, MD as PCP - Cardiology (Cardiology) Elsie Stain, MD (Pulmonary Disease) [name], MD as Consulting Physician (Gastroenterology)  Extended Emergency Contact Information Primary Emergency Contact: Denny Peon, [place] Phone: [phone] Mobile Phone: [phone] Relation: Son Secondary Emergency Contact: [place] Mobile Phone: [phone] Relation: Son     Allergies: Patient has no known allergies.  Chief Complaint  Patient presents with   New Admit To SNF    New admission to [facility]    HPI: Patient is a 73 y.o. female with hypertension, diabetes mellitus type 2, CAD, COPD with chronic hypoxic respiratory failure, CKD 3, HFpEF who was recently discharged last week for treatment of acute decompensated heart failure with increased lower extremity edema and persistent anasarca who presented to the ED  complaining of weakness.  In the ED patient was found to be in atrial flutter with RVR which is a new diagnosis.  Patient was admitted to [facility] from 11/10-16 where she was treated for her atrial flutter with RVR with IV diltiazem after which she converted to normal sinus rhythm.  She was started on oral diltiazem per cardiology and remains on Toprol.  She was placed on Eliquis.  Patient was also found to be in acute on chronic diastolic congestive heart failure and was treated with IV Lasix to resume her oral diuretics at discharge.  Patient is admitted to skilled nursing facility for OT/PT while at skilled nursing facility patient will be followed for COPD with chronic failure, treat with as needed albuterol, guaifenesin respiratory and Trelegy  Ellipta, hypertension treated with diltiazem, hydralazine metoprolol, and Demadex, and diabetes mellitus treated with insulin and statin.  Past Medical History:  Diagnosis Date   Blood transfusion without reported diagnosis    CAD (coronary artery disease)    Nonobstructive at cardiac catheterization 2000   Cataract    Cervical cancer ([place]) 1978   CHF (congestive heart failure) (HCC)    Chronic back pain    Chronic kidney disease    COPD (chronic obstructive pulmonary disease) (HCC)    Degenerative disc disease    Essential hypertension, benign    GERD (gastroesophageal reflux disease)    Gout    Hiatal hernia 07/27/2013   History of diverticulitis of colon    History of hiatal hernia    Iron deficiency anemia    Irritable bowel syndrome    Lumbar radiculopathy    Mixed hyperlipidemia    Neuropathy    Osteoporosis    Ovarian cancer ([place]) 1978   patient denies. States this was cervical cancer   Oxygen deficiency    Sleep apnea    Type 2 diabetes mellitus ([place])    Vitamin B deficiency 12/25/2009   Vitamin B12 deficiency     Past Surgical History:  Procedure Laterality Date   ABDOMINAL HYSTERECTOMY     BACK SURGERY     Benign breast cysts     CHOLECYSTECTOMY     COLONOSCOPY  10/01/2006   SLF:Pan colonic diverticulosis and moderate internal hemorrhoids/ Otherwise no polyps, masses, inflammatory changes or AVMs/   COLONOSCOPY  2011   SLF: pancolonic diverticulosis, large internal hemorrhoids   COLONOSCOPY  N/A 01/26/2016   Procedure: COLONOSCOPY;  Surgeon: [name], MD;  Location: [facility];  Service: Endoscopy;  Laterality: N/A;  830    ESOPHAGOGASTRODUODENOSCOPY  11/19/2006   SLF: Large hiatal hernia without evidence of [place] ulcers/. Distal esophageal stricture, which allowed the gastroscope to pass without resistance.  A 16 mm Savary later passed with mild resistance/ Normal stomach.sb bx negative    ESOPHAGOGASTRODUODENOSCOPY  10/01/2006   ZYS:AYTKZ hiatal hernia.  Distal esophagus without evidence of   erythema, ulceration or Barrett's esophagus   ESOPHAGOGASTRODUODENOSCOPY  2011   SLF: large hh, distal esophageal web narrowing to 37mm s/p dilation to 38mm   ESOPHAGOGASTRODUODENOSCOPY N/A 08/06/2013   SLF: 1. Stricture at the gastroesophageal junction 2. large hiatal hernia. 3. Mild erosive gastritis.   GIVENS CAPSULE STUDY N/A 08/06/2013   INCOMPLETE-SMALL BOWLE ULCERS   KNEE SURGERY Right    PARTIAL HYSTERECTOMY  1978   small bowel capsule  2008   negative   SPINE SURGERY     TONSILLECTOMY AND ADENOIDECTOMY     Two back surgeries/fusion     UMBILICAL HERNIA REPAIR  2010    Allergies as of 06/23/2019   No Known Allergies     Medication List    Notice   This visit is during an admission. Changes to the med list made in this visit will be reflected in the After Visit Summary of the admission.    Current Outpatient Medications on File Prior to Visit  Medication Sig Dispense Refill   albuterol (PROVENTIL) (2.5 MG/3ML) 0.083% nebulizer solution Take 2.5 mg by nebulization every 6 (six) hours as needed for wheezing or shortness of breath.      albuterol (VENTOLIN HFA) 108 (90 Base) MCG/ACT inhaler Inhale 2 puffs into the lungs every 6 (six) hours as needed for wheezing or shortness of breath.      allopurinol (ZYLOPRIM) 100 MG tablet Take 100 mg by mouth 2 (two) times daily.      amitriptyline (ELAVIL) 100 MG tablet Take 100 mg by mouth at bedtime.       apixaban (ELIQUIS) 5 MG TABS tablet Take 1 tablet (5 mg total) by mouth 2 (two) times daily. 60 tablet    aspirin EC 81 MG tablet Take 81 mg by mouth daily.     atorvastatin (LIPITOR) 40 MG tablet Take 1 tablet (40 mg total) by mouth at bedtime. 90 tablet 1   BIOTIN PO Take 1 capsule by mouth daily.     diltiazem (CARDIZEM CD) 120 MG 24 hr capsule Take 1 capsule (120 mg total) by mouth daily.     esomeprazole  (NEXIUM) 40 MG capsule TAKE 1 CAPSULE TWICE A DAY 30 MINUTES PRIOR TO MEALS 180 capsule 1   famotidine (PEPCID) 40 MG tablet TAKE 1 TABLET AT BEDTIME AS NEEDED TO CONTROL REFLUX 90 tablet 1   guaiFENesin (MUCINEX) 600 MG 12 hr tablet Take 1 tablet (600 mg total) by mouth 2 (two) times daily. 30 tablet 0   hydrALAZINE (APRESOLINE) 25 MG tablet Take 25 mg by mouth 2 (two) times daily.     insulin lispro (HUMALOG KWIKPEN) 100 UNIT/ML KwikPen Inject 5 Units into the skin 3 (three) times daily.     LANTUS SOLOSTAR 100 UNIT/ML SOPN Inject 16 Units into the skin at bedtime.      loperamide (IMODIUM) 2 MG capsule Take 2 mg by mouth every 6 (six) hours as needed for diarrhea or loose stools.      metolazone (  ZAROXOLYN) 2.5 MG tablet Take 1 tablet (2.5 mg total) by mouth every Monday, Wednesday, and Friday. 90 tablet 1   metoprolol succinate (TOPROL-XL) 25 MG 24 hr tablet Take 25 mg by mouth daily. Along with 50 mg to = 75 mg     metoprolol succinate (TOPROL-XL) 50 MG 24 hr tablet Take 50 mg by mouth daily. Along with 25 mg to = 75 mg     NON FORMULARY Diet; Regular, NAS, Consistent Carbohydrate     OXYGEN Inhale 2 L into the lungs. Every Shift     torsemide (DEMADEX) 10 MG tablet Take 3 tablets (30 mg total) by mouth 2 (two) times daily. 90 tablet 2   TRELEGY ELLIPTA 100-62.5-25 MCG/INH AEPB Inhale 1 puff into the lungs daily.     No current facility-administered medications on file prior to visit.      No orders of the defined types were placed in this encounter.   Immunization History  Administered Date(s) Administered   Influenza Split 05/14/2011, 05/05/2013   Influenza,inj,Quad PF,6+ Mos 06/20/2017, 05/05/2018, 05/27/2019   Influenza-Unspecified 05/14/2011, 05/05/2013   Pneumococcal Conjugate-13 03/07/2014   Pneumococcal Polysaccharide-23 08/06/2007, 06/30/2013   Tdap 08/13/2018    Social History   Tobacco Use   Smoking status: Former Smoker    Packs/day: 1.00     Years: 1.00    Pack years: 1.00    Types: Cigarettes    Start date: 02/19/1961    Quit date: 08/05/1961    Years since quitting: 57.9   Smokeless tobacco: Never Used   Tobacco comment: Quit smoking x 50 years  Substance Use Topics   Alcohol use: No    Family history is   Family History  Problem Relation Age of Onset   Colon cancer Brother        diagnosed age 69. Living.    Ulcers Sister    Diabetes Sister    Heart attack Sister    Kidney failure Sister    Stroke Sister    Ulcers Mother    Diabetes Mother    Heart attack Mother    Stroke Mother    Asthma Mother    Heart disease Mother    Cervical cancer Mother    Heart attack Brother    Heart disease Brother    Asthma Sister    Diabetes Brother    Stroke Maternal Grandmother    Heart attack Maternal Grandmother    Heart attack Other    Early death Father        MVA in his 35s      Review of Systems  DATA OBTAINED: from patient GENERAL:  no fevers, fatigue, appetite changes SKIN: No itching, or rash EYES: No eye pain, redness, discharge EARS: No earache, tinnitus, change in hearing NOSE: No congestion, drainage or bleeding  MOUTH/THROAT: No mouth or tooth pain, No sore throat RESPIRATORY: No cough, wheezing, SOB CARDIAC: No chest pain, palpitations, lower extremity edema  GI: No abdominal pain, No N/V/D or constipation, No heartburn or reflux  GU: No dysuria, frequency or urgency, or incontinence  MUSCULOSKELETAL: Patient complained of left elbow pain; denied trauma NEUROLOGIC: No headache, dizziness or focal weakness PSYCHIATRIC: No c/o anxiety or sadness   Vitals:   06/23/19 1553  BP: 110/66  Pulse: 81  Temp: 97.7 F (36.5 C)    SpO2 Readings from Last 1 Encounters:  06/21/19 98%   Body mass index is 49.19 kg/m.     Physical Exam  GENERAL APPEARANCE: Alert, conversant,  No acute distress.  SKIN: No diaphoresis rash HEAD: Normocephalic, atraumatic  EYES:  Conjunctiva/lids clear. Pupils round, reactive. EOMs intact.  EARS: External exam WNL, canals clear. Hearing grossly normal.  NOSE: No deformity or discharge.  MOUTH/THROAT: Lips w/o lesions  RESPIRATORY: Breathing is even, unlabored. Lung sounds are clear   CARDIOVASCULAR: Heart RRR no murmurs, rubs or gallops. 1+ peripheral edema.   GASTROINTESTINAL: Abdomen is soft, non-tender, not distended w/ normal bowel sounds. GENITOURINARY: Bladder non tender, not distended  MUSCULOSKELETAL: Left elbow is tender on medial and lateral epicondyles and patient is unable to extend elbow, there is some swelling NEUROLOGIC:  Cranial nerves 2-12 grossly intact. 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 ([place]) 06/22/2019   CKD stage 3 due to type 2 diabetes mellitus ([place]) 06/22/2019   Neuropathy due to type 2 diabetes mellitus ([place]) 06/22/2019   Dyslipidemia associated with type 2 diabetes mellitus ([place]) 06/22/2019   Chronic gout 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 06/22/2019   Quality of life palliative care encounter 06/19/2019   Atrial fibrillation with RVR (HCC)    Atrial flutter with rapid ventricular response ([place]) 06/16/2019   Dyspnea and respiratory abnormalities 06/01/2019   Acute on chronic respiratory failure with hypoxia and hypercapnia with respiratory acidosis 06/01/2019   Anasarca 05/31/2019   Nausea without vomiting 05/31/2019   Generalized abdominal pain 05/31/2019   History of colonic polyps 05/31/2019   Lymphedema of both lower extremities 04/28/2019   Chronic respiratory failure with hypoxia ([place]) 07/13/2018   Acute renal failure superimposed on stage 3 chronic kidney disease ([place]) 07/13/2018   Chronic diastolic CHF (congestive heart  failure) ([place]) 07/13/2018   COPD (chronic obstructive pulmonary disease) ([place]) 07/13/2018   COPD exacerbation ([place]) 07/12/2018   Flatulence 09/18/2017   IBS (irritable bowel syndrome) 04/03/2017   Gout 03/15/2017   Acute on chronic diastolic CHF (congestive heart failure) ([place]) 05/08/2016   CKD (chronic kidney disease) stage 3, GFR 30-59 ml/min 05/08/2016   COPD with acute exacerbation ([place]) 05/08/2016   Constipation 04/25/2016   Peripheral vertigo 06/27/2015   Port-A-Cath in place 10/06/2014   Chronic obstructive pulmonary disease ([place]) 04/30/2014   Obesity, Class III, BMI 40-49.9 (morbid obesity) ([place]) 04/30/2014   Hemorrhoids, internal, with bleeding 11/10/2013   Anemia 08/18/2013   Hiatal hernia with GERD and esophagitis    Obstructive chronic bronchitis without exacerbation ([place]) 12/26/2009   Dysphagia 12/26/2009   DM type 2 with diabetic peripheral neuropathy ([place]) 12/25/2009   Vitamin B deficiency 12/25/2009   Iron deficiency anemia due to chronic blood loss 12/25/2009   Benign essential HTN 12/25/2009   DEGENERATIVE DISC DISEASE 12/25/2009   OSA on CPAP 12/25/2009   HLD (hyperlipidemia) 12/25/2009   Type 2 diabetes with nephropathy ([place]) 12/25/2009      Labs reviewed: Basic Metabolic Panel:    Component Value Date/Time   NA 140 06/20/2019 0641   K 4.0 06/20/2019 0641   CL 95 (L) 06/20/2019 0641   CO2 33 (H) 06/20/2019 0641   GLUCOSE 103 (H) 06/20/2019 0641   BUN 62 (H) 06/20/2019 0641   CREATININE 2.24 (H) 06/20/2019 0641   CREATININE 1.68 (H) 10/10/2017 1050   CALCIUM 8.1 (L) 06/20/2019 0641   PROT 7.6 05/31/2019 1658   ALBUMIN 3.0 (L) 06/05/2019 0446   AST  14 (L) 05/31/2019 1658   ALT 10 05/31/2019 1658   ALKPHOS 71 05/31/2019 1658   BILITOT 1.0 05/31/2019 1658   GFRNONAA 21 (L) 06/20/2019 0641   GFRNONAA 30 (L) 10/10/2017 1050   GFRAA 24 (L) 06/20/2019 0641   GFRAA 35 (L) 10/10/2017 1050    Recent Labs    06/04/19 0501  06/05/19 0446  06/17/19 0916 06/18/19 0505 06/19/19 0658 06/20/19 0641  NA 138   143 143   < > 141 142 142 140  K 4.5   4.6 3.9   < > 4.1 3.9 3.9 4.0  CL 100   104 102   < > 96* 97* 95* 95*  CO2 27   26 30    < > 31 33* 33* 33*  GLUCOSE 172*   168* 107*   < > 122* 118* 117* 103*  BUN 47*   47* 55*   < > 65* 65* 65* 62*  CREATININE 2.36*   2.36* 2.74*   < > 2.63* 2.60* 2.44* 2.24*  CALCIUM 7.7*   7.9* 7.6*   < > 8.1* 7.9* 8.2* 8.1*  MG  --   --   --  1.7 1.5*  --   --   PHOS 6.5* 6.1*  --   --   --   --   --    < > = values in this interval not displayed.   Liver Function Tests: Recent Labs    05/31/19 1658 06/04/19 0501 06/05/19 0446  AST 14*  --   --   ALT 10  --   --   ALKPHOS 71  --   --   BILITOT 1.0  --   --   PROT 7.6  --   --   ALBUMIN 3.8 3.0* 3.0*   Recent Labs    05/31/19 1658  LIPASE 38   No results for input(s): AMMONIA in the last 8760 hours. CBC: Recent Labs    07/12/18 1335 11/22/18 2111  06/04/19 0501 06/15/19 1340 06/16/19 0730  WBC 7.6 8.2   < > 4.7 10.7* 12.7*  NEUTROABS 5.3 5.9  --   --   --   --   HGB 10.9* 10.8*   < > 10.0* 11.2* 10.6*  HCT 35.7* 35.1*   < > 34.0* 36.8 35.6*  MCV 92.2 86.2   < > 91.4 88.5 90.1  PLT 255 304   < > 203 250 231   < > = values in this interval not displayed.   Lipid No results for input(s): CHOL, HDL, LDLCALC, TRIG in the last 8760 hours.  Cardiac Enzymes: Recent Labs    07/12/18 1335  TROPONINI <0.03   BNP: Recent Labs    07/12/18 1335 05/31/19 1658 06/15/19 1347  BNP 136.0* 206.0* 327.0*   No results found for: [facility] Lab Results  Component Value Date   HGBA1C 6.7 (H) 07/13/2018   No results found for: TSH Lab Results  Component Value Date   VITAMINB12 451 08/28/2017   Lab Results  Component Value Date   FOLATE 9.1 08/28/2017   Lab Results  Component Value Date   IRON 67 08/28/2017   TIBC 290 08/28/2017   FERRITIN 97 08/28/2017    Imaging and Procedures obtained prior to  SNF admission: No results found.   Not all labs, radiology exams or other studies done during hospitalization come through on my EPIC note; however they are reviewed by me.    Assessment and Plan  Atrial flutter  with RVR-treated with IV diltiazem and converted to normal sinus rhythm and changed to oral diltiazem; remains on Toprol and placed on Eliquis SNF-continue diltiazem 120 mg daily, metoprolol 50 mg XL 75 mg daily and Eliquis 5 mg twice daily  Acute on chronic diastolic congestive heart failure-treated with IV Lasix and returning to oral diuretics on discharge SNF-continue metoprolol XL 75 mg daily, Demadex 30 mg twice daily in Zaroxolyn 2.5 mg q. Monday Wednesday Friday  COPD with chronic respiratory failure SNF-stable; continue as needed albuterol guaifenesin 600 mg twice daily and Trelegy Ellipta 100-62 point 5-25 1 puff daily  Diabetes mellitus SNF-not stated as uncontrolled; continue Lantus Solostar 16 units nightly and Humalog 14 to 16 units in the skin daily as needed per sliding scale  Hypertension SNF-controlled; continue diltiazem 120 mg daily, Toprol-XL 75 mg daily and hydralazine 25 mg twice daily,  Hyperlipidemia SNF-not stated as uncontrolled; continue Lipitor 40 mg daily  Chronic gout SNF-controlled; continue allopurinol 100 mg twice daily  Left elbow pain SNF-no redness or heat suggesting an inflammatory process; left elbow film   Time spent greater than 45 minutes;> 50% of time with patient was spent reviewing records, labs, tests and studies, counseling and developing plan of care Full PPE required for visit Hennie Duos, MD

## 2019-06-24 ENCOUNTER — Encounter: Payer: Self-pay | Admitting: Adult Health

## 2019-06-24 ENCOUNTER — Non-Acute Institutional Stay (SKILLED_NURSING_FACILITY): Payer: Medicare Other | Admitting: Adult Health

## 2019-06-24 DIAGNOSIS — J9611 Chronic respiratory failure with hypoxia: Secondary | ICD-10-CM | POA: Diagnosis not present

## 2019-06-24 DIAGNOSIS — I5033 Acute on chronic diastolic (congestive) heart failure: Secondary | ICD-10-CM | POA: Diagnosis not present

## 2019-06-24 DIAGNOSIS — I4892 Unspecified atrial flutter: Secondary | ICD-10-CM

## 2019-06-24 NOTE — Progress Notes (Signed)
Location:    [facility] Room Number: 159/P Place of Service:  SNF (31)   CODE STATUS: Full Code  No Known Allergies  Chief Complaint  Patient presents with  . Acute Visit    74-Hour Care Plan Meeting    HPI:  We have come together for her 74 hour care plan meeting. She had bene living alone prior to her hospitalization. She does not have steps. She was using 02 at home. Her family is supportive. She has a walker at home; more than likely will require a wheelchair upon discharge. She denies any controlled pain; no changes in her appetite. No cough or shortness of breath. Her goal is to return back home.   Past Medical History:  Diagnosis Date  . Blood transfusion without reported diagnosis   . CAD (coronary artery disease)    Nonobstructive at cardiac catheterization 2000  . Cataract   . Cervical cancer ([place]) 1978  . CHF (congestive heart failure) ([place])   . Chronic back pain   . Chronic kidney disease   . COPD (chronic obstructive pulmonary disease) ([place])   . Degenerative disc disease   . Essential hypertension, benign   . GERD (gastroesophageal reflux disease)   . Gout   . Hiatal hernia 07/27/2013  . History of diverticulitis of colon   . History of hiatal hernia   . Iron deficiency anemia   . Irritable bowel syndrome   . Lumbar radiculopathy   . Mixed hyperlipidemia   . Neuropathy   . Osteoporosis   . Ovarian cancer [facility]) 1978   patient denies. States this was cervical cancer  . Oxygen deficiency   . Sleep apnea   . Type 2 diabetes mellitus ([place])   . Vitamin B deficiency 12/25/2009  . Vitamin B12 deficiency     Past Surgical History:  Procedure Laterality Date  . ABDOMINAL HYSTERECTOMY    . BACK SURGERY    . Benign breast cysts    . CHOLECYSTECTOMY    . COLONOSCOPY  10/01/2006   SLF:Pan colonic diverticulosis and moderate internal hemorrhoids/ Otherwise no polyps, masses, inflammatory changes or AVMs/  . COLONOSCOPY  2011   SLF:  pancolonic diverticulosis, large internal hemorrhoids  . COLONOSCOPY N/A 01/26/2016   Procedure: COLONOSCOPY;  Surgeon: [name], MD;  Location: [facility];  Service: Endoscopy;  Laterality: N/A;  830   . ESOPHAGOGASTRODUODENOSCOPY  11/19/2006   SLF: Large hiatal hernia without evidence of Cameron ulcers/. Distal esophageal stricture, which allowed the gastroscope to pass without resistance.  A 16 mm Savary later passed with mild resistance/ Normal stomach.sb bx negative  . ESOPHAGOGASTRODUODENOSCOPY  10/01/2006   IRW:ERXVQ hiatal hernia.  Distal esophagus without evidence of   erythema, ulceration or Barrett's esophagus  . ESOPHAGOGASTRODUODENOSCOPY  2011   SLF: large hh, distal esophageal web narrowing to 58mm s/p dilation to 90mm  . ESOPHAGOGASTRODUODENOSCOPY N/A 08/06/2013   SLF: 1. Stricture at the gastroesophageal junction 2. large hiatal hernia. 3. Mild erosive gastritis.  Marland Kitchen GIVENS CAPSULE STUDY N/A 08/06/2013   INCOMPLETE-SMALL BOWLE ULCERS  . KNEE SURGERY Right   . PARTIAL HYSTERECTOMY  1978  . small bowel capsule  2008   negative  . SPINE SURGERY    . TONSILLECTOMY AND ADENOIDECTOMY    . Two back surgeries/fusion    . UMBILICAL HERNIA REPAIR  2010    Social History   Socioeconomic History  . Marital status: Widowed    Spouse name: Poche  . Number  of children: 4  . Years of education: Not on file  . Highest education level: 10th grade  Occupational History  . 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 Financial resource strain: Not on file  . Food insecurity    Worry: Not on file    Inability: Not on file  . Transportation needs    Medical: Not on file    Non-medical: Not on file  Tobacco Use  . Smoking status: Former Smoker    Packs/day: 1.00    Years: 1.00    Pack years: 1.00    Types: Cigarettes    Start date: 02/19/1961    Quit date: 08/05/1961    Years since quitting: 57.9  . Smokeless tobacco: Never Used  . Tobacco  comment: Quit smoking x 50 years  Substance and Sexual Activity  . Alcohol use: No  . Drug use: No  . Sexual activity: Not Currently  Lifestyle  . Physical activity    Days per week: Not on file    Minutes per session: Not on file  . Stress: Not on file  Relationships  . Social Herbalist on phone: Not on file    Gets together: Not on file    Attends religious service: Not on file    Active member of club or organization: Not on file    Attends meetings of clubs or organizations: Not on file    Relationship status: Not on file  . Intimate partner violence    Fear of current or ex partner: Not on file    Emotionally abused: Not on file    Physically abused: Not on file    Forced sexual activity: Not on file  Other Topics Concern  . Not on file  Social History Narrative   HAS 4 SON-GRAND [place].   Lives in home with Zegarra - married 13 Y   Cook, sew, quilt, crochet   Family History  Problem Relation Age of Onset  . Colon cancer Brother        diagnosed age 25. Living.   Marland Kitchen Ulcers Sister   . Diabetes Sister   . Heart attack Sister   . Kidney failure Sister   . Stroke Sister   . Ulcers Mother   . Diabetes Mother   . Heart attack Mother   . Stroke Mother   . Asthma Mother   . Heart disease Mother   . Cervical cancer Mother   . Heart attack Brother   . Heart disease Brother   . Asthma Sister   . Diabetes Brother   . Stroke Maternal Grandmother   . Heart attack Maternal Grandmother   . Heart attack Other   . Early death Father        MVA in his 59s      VITAL SIGNS BP 125/76   Pulse 83   Temp (!) 96.8 F (36 C) (Oral)   Resp 20   Ht 4\' 8"  (1.422 m)   Wt 215 lb 8 oz (97.8 kg)   SpO2 100%   BMI 48.31 kg/m   Outpatient Encounter Medications as of 06/24/2019  Medication Sig  . albuterol (PROVENTIL) (2.5 MG/3ML) 0.083% nebulizer solution Take 2.5 mg by nebulization every 6 (six) hours as needed for wheezing or shortness of  breath.   Marland Kitchen albuterol (VENTOLIN HFA) 108 (90 Base) MCG/ACT inhaler Inhale 2 puffs into the lungs every 6 (six)  hours as needed for wheezing or shortness of breath.   . allopurinol (ZYLOPRIM) 100 MG tablet Take 100 mg by mouth 2 (two) times daily.   Marland Kitchen amitriptyline (ELAVIL) 100 MG tablet Take 100 mg by mouth at bedtime.    Marland Kitchen apixaban (ELIQUIS) 5 MG TABS tablet Take 1 tablet (5 mg total) by mouth 2 (two) times daily.  Marland Kitchen aspirin EC 81 MG tablet Take 81 mg by mouth daily.  Marland Kitchen atorvastatin (LIPITOR) 40 MG tablet Take 1 tablet (40 mg total) by mouth at bedtime.  Marland Kitchen BIOTIN PO Take 1 capsule by mouth daily.  Marland Kitchen diltiazem (CARDIZEM CD) 120 MG 24 hr capsule Take 1 capsule (120 mg total) by mouth daily.  Marland Kitchen esomeprazole (NEXIUM) 40 MG capsule TAKE 1 CAPSULE TWICE A DAY 30 MINUTES PRIOR TO MEALS  . famotidine (PEPCID) 40 MG tablet TAKE 1 TABLET AT BEDTIME AS NEEDED TO CONTROL REFLUX  . guaiFENesin (MUCINEX) 600 MG 12 hr tablet Take 1 tablet (600 mg total) by mouth 2 (two) times daily.  . hydrALAZINE (APRESOLINE) 25 MG tablet Take 25 mg by mouth 2 (two) times daily.  . insulin lispro (HUMALOG KWIKPEN) 100 UNIT/ML KwikPen Inject 5 Units into the skin 3 (three) times daily.  Marland Kitchen LANTUS SOLOSTAR 100 UNIT/ML SOPN Inject 16 Units into the skin at bedtime.   Marland Kitchen loperamide (IMODIUM) 2 MG capsule Take 2 mg by mouth every 6 (six) hours as needed for diarrhea or loose stools.   . metolazone (ZAROXOLYN) 2.5 MG tablet Take 1 tablet (2.5 mg total) by mouth every Monday, Wednesday, and Friday.  . metoprolol succinate (TOPROL-XL) 25 MG 24 hr tablet Take 25 mg by mouth daily. Along with 50 mg to = 75 mg  . metoprolol succinate (TOPROL-XL) 50 MG 24 hr tablet Take 50 mg by mouth daily. Along with 25 mg to = 75 mg  . NON FORMULARY Diet; Regular, NAS, Consistent Carbohydrate  . OXYGEN Inhale 2 L into the lungs. Every Shift  . torsemide (DEMADEX) 10 MG tablet Take 3 tablets (30 mg total) by mouth 2 (two) times daily.  . TRELEGY  ELLIPTA 100-62.5-25 MCG/INH AEPB Inhale 1 puff into the lungs daily.   No facility-administered encounter medications on file as of 06/24/2019.      SIGNIFICANT DIAGNOSTIC EXAMS  PREVIOUS;  05-31-19: ct of abdomen and pelvis:  Large hiatal hernia stable from the prior exam. Some wall thickening in the stomach is noted suspicious for gastritis. No obstructive changes are noted. Chronic changes similar to that seen on the prior exam. Changes of anasarca stable from the previous study.  05-31-19: chest x-ray:  1. Irregularity along the right heart border likely corresponds to the large hiatal hernia seen on CT of the abdomen pelvis. 2. Bibasilar atelectasis. No other acute cardiopulmonary abnormality. Increased attenuation of the bases likely related to body habitus. 3. Slightly lobular configuration of the heart could reflect the small volume pericardial fluid seen on CT abdomen pelvis.  06-15-19: chest x-ray; No active disease. Similar appearance of large hiatal hernia, mild cardiomegaly, and basilar scarring  06-16-19: 2-echo:   1. Left ventricular ejection fraction, by visual estimation, is 55 to 60%. The left ventricle has normal function. There is no left ventricular hypertrophy.  2. Elevated left atrial pressure.  3. Left ventricular diastolic parameters are consistent with Grade II diastolic dysfunction (pseudonormalization).  4. Global right ventricle has normal systolic function.The right ventricular size is normal. No increase in right ventricular wall thickness.  5. Left  atrial size was moderately dilated.  6. Right atrial size was normal.  7. The mitral valve was not well visualized. Trace mitral valve regurgitation. No evidence of mitral stenosis.  8. The tricuspid valve is normal in structure. Tricuspid valve regurgitation is mild.  9. The aortic valve was not well visualized. Aortic valve regurgitation is not visualized. No evidence of aortic valve sclerosis or stenosis.  10. The pulmonic valve was not well visualized. Pulmonic valve regurgitation not assessed. 11. The aortic root was not well visualized. 12. Moderately elevated pulmonary artery systolic pressure. 13. Moderate pulmonary HTN, PASP is 48 mmHg. 14. The inferior vena cava is normal in size with greater than 50% respiratory variability, suggesting right atrial pressure of 3 mmHg. 15. The interatrial septum was not well visualized.  NO NEW EXAMS.    LABS REVIEWED: PREVIOUS  06-15-19: wbc 10.7; hgb 11.2; hct 36.8; mcv 88.5 plt 250; glucose 181; bun 64; creat 2.05; k+ 3.9; na++ 138; ca 8.2; BNP 327.0 06-17-19: glucose 122; bun 65; creat 1.263; k+ 4.1; na++ 141; ca 8.1; mag 1.6 06-19-19: glucose 117; bun 65; creat 2.44; k+ 3.9; na++ 142; ca 8.2   NO NEW LABS.    Review of Systems  Constitutional: Negative for malaise/fatigue.  Respiratory: Negative for cough and shortness of breath.   Cardiovascular: Negative for chest pain, palpitations and leg swelling.  Gastrointestinal: Negative for abdominal pain, constipation and heartburn.  Musculoskeletal: Negative for back pain, joint pain and myalgias.  Skin: Negative.   Neurological: Negative for dizziness.  Psychiatric/Behavioral: The patient is not nervous/anxious.     Physical Exam Constitutional:      General: She is not in acute distress.    Appearance: She is well-developed. She is morbidly obese. She is not diaphoretic.  Neck:     Musculoskeletal: Neck supple.     Thyroid: No thyromegaly.  Cardiovascular:     Rate and Rhythm: Normal rate and regular rhythm.     Pulses: Normal pulses.     Heart sounds: Normal heart sounds.  Pulmonary:     Effort: Pulmonary effort is normal. No respiratory distress.     Breath sounds: Normal breath sounds.     Comments: 02 dependent Using CPAP at night Abdominal:     General: Bowel sounds are normal. There is no distension.     Palpations: Abdomen is soft.     Tenderness: There is no abdominal  tenderness.  Musculoskeletal:     Right lower leg: Edema present.     Left lower leg: Edema present.     Comments:  2 + bilateral lower extremity edema Is able to move all extremities    Lymphadenopathy:     Cervical: No cervical adenopathy.  Skin:    General: Skin is warm and dry.     Comments: Bilateral lower extremities discolored  Neurological:     Mental Status: She is alert and oriented to person, place, and time.  Psychiatric:        Mood and Affect: Mood normal.       ASSESSMENT/ PLAN:  TODAY; '  1. atrial flutter with rapid ventricular response 2. Acute on chronic diastolic CHF (congestive heart failure)  3. Chronic respiratory failure with hypoxia    Will continue therapy as directed Will continue current medications Will continue current plan of care Will continue to monitor her status She will need a wheelchair upon discharge.       MD is aware of resident's narcotic use and is in  agreement with current plan of care. We will attempt to wean resident as appropriate.  Ok Edwards NP [facility]  Contact [phone] Monday through Friday 8am- 5pm  After hours call [phone]

## 2019-06-26 ENCOUNTER — Encounter: Payer: Self-pay | Admitting: Internal Medicine

## 2019-06-28 ENCOUNTER — Ambulatory Visit: Payer: 59 | Admitting: Gastroenterology

## 2019-06-28 ENCOUNTER — Non-Acute Institutional Stay (SKILLED_NURSING_FACILITY): Payer: Medicare Other | Admitting: Adult Health

## 2019-06-28 ENCOUNTER — Other Ambulatory Visit (HOSPITAL_COMMUNITY)
Admission: RE | Admit: 2019-06-28 | Discharge: 2019-06-28 | Disposition: A | Discharge status: Home / Self Care | Payer: Medicare Other | Source: Skilled Nursing Facility | Attending: Adult Health | Admitting: Adult Health

## 2019-06-28 ENCOUNTER — Encounter: Payer: Self-pay | Admitting: Adult Health

## 2019-06-28 DIAGNOSIS — E1142 Type 2 diabetes mellitus with diabetic polyneuropathy: Secondary | ICD-10-CM

## 2019-06-28 DIAGNOSIS — R630 Anorexia: Secondary | ICD-10-CM

## 2019-06-28 DIAGNOSIS — E1122 Type 2 diabetes mellitus with diabetic chronic kidney disease: Secondary | ICD-10-CM | POA: Diagnosis not present

## 2019-06-28 DIAGNOSIS — I130 Hypertensive heart and chronic kidney disease with heart failure and stage 1 through stage 4 chronic kidney disease, or unspecified chronic kidney disease: Secondary | ICD-10-CM | POA: Diagnosis present

## 2019-06-28 DIAGNOSIS — N183 Chronic kidney disease, stage 3 unspecified: Secondary | ICD-10-CM

## 2019-06-28 DIAGNOSIS — I5032 Chronic diastolic (congestive) heart failure: Secondary | ICD-10-CM

## 2019-06-28 LAB — BASIC METABOLIC PANEL
Anion gap: 17 — ABNORMAL HIGH (ref 5–15)
BUN: 85 mg/dL — ABNORMAL HIGH (ref 8–23)
CO2: 35 mmol/L — ABNORMAL HIGH (ref 22–32)
Calcium: 8 mg/dL — ABNORMAL LOW (ref 8.9–10.3)
Chloride: 88 mmol/L — ABNORMAL LOW (ref 98–111)
Creatinine, Ser: 3.42 mg/dL — ABNORMAL HIGH (ref 0.44–1.00)
GFR calc Af Amer: 15 mL/min — ABNORMAL LOW (ref >60)
GFR calc non Af Amer: 13 mL/min — ABNORMAL LOW (ref >60)
Glucose, Bld: 34 mg/dL — CL (ref 70–99)
Potassium: 3.7 mmol/L (ref 3.5–5.1)
Sodium: 140 mmol/L (ref 135–145)

## 2019-06-28 LAB — CBC
HCT: 33.1 % — ABNORMAL LOW (ref 36.0–46.0)
Hemoglobin: 10 g/dL — ABNORMAL LOW (ref 12.0–15.0)
MCH: 26.9 pg (ref 26.0–34.0)
MCHC: 30.2 g/dL (ref 30.0–36.0)
MCV: 89 fL (ref 80.0–100.0)
Platelets: 239 10*3/uL (ref 150–400)
RBC: 3.72 MIL/uL — ABNORMAL LOW (ref 3.87–5.11)
RDW: 14.3 % (ref 11.5–15.5)
WBC: 11.9 10*3/uL — ABNORMAL HIGH (ref 4.0–10.5)
nRBC: 0 % (ref 0.0–0.2)

## 2019-06-28 NOTE — Progress Notes (Signed)
Location:    [place] Room Number: 159/P Place of Service:  SNF (31)   CODE STATUS: Full Code  No Known Allergies  Chief Complaint  Patient presents with  . Follow-up    Lab Follow Up    HPI:  Her renal failure is worse. She has had low cbg readings in the AM. She states that she has no appetite at all. She finds it difficult to eat. She does have some shortness of breath which she states in chronic in nature. She denies cough or chest pain. She does have bilateral lower extremity edema.   Past Medical History:  Diagnosis Date  . Blood transfusion without reported diagnosis   . CAD (coronary artery disease)    Nonobstructive at cardiac catheterization 2000  . Cataract   . Cervical cancer ([place]) 1978  . CHF (congestive heart failure) ([place])   . Chronic back pain   . Chronic kidney disease   . COPD (chronic obstructive pulmonary disease) ([place])   . Degenerative disc disease   . Essential hypertension, benign   . GERD (gastroesophageal reflux disease)   . Gout   . Hiatal hernia 07/27/2013  . History of diverticulitis of colon   . History of hiatal hernia   . Iron deficiency anemia   . Irritable bowel syndrome   . Lumbar radiculopathy   . Mixed hyperlipidemia   . Neuropathy   . Osteoporosis   . Ovarian cancer [facility]) 1978   patient denies. States this was cervical cancer  . Oxygen deficiency   . Sleep apnea   . Type 2 diabetes mellitus ([place])   . Vitamin B deficiency 12/25/2009  . Vitamin B12 deficiency     Past Surgical History:  Procedure Laterality Date  . ABDOMINAL HYSTERECTOMY    . BACK SURGERY    . Benign breast cysts    . CHOLECYSTECTOMY    . COLONOSCOPY  10/01/2006   SLF:Pan colonic diverticulosis and moderate internal hemorrhoids/ Otherwise no polyps, masses, inflammatory changes or AVMs/  . COLONOSCOPY  2011   SLF: pancolonic diverticulosis, large internal hemorrhoids  . COLONOSCOPY N/A 01/26/2016   Procedure: COLONOSCOPY;   Surgeon: [name], MD;  Location: [facility];  Service: Endoscopy;  Laterality: N/A;  830   . ESOPHAGOGASTRODUODENOSCOPY  11/19/2006   SLF: Large hiatal hernia without evidence of Cameron ulcers/. Distal esophageal stricture, which allowed the gastroscope to pass without resistance.  A 16 mm Savary later passed with mild resistance/ Normal stomach.sb bx negative  . ESOPHAGOGASTRODUODENOSCOPY  10/01/2006   URK:[facility] hiatal hernia.  Distal esophagus without evidence of   erythema, ulceration or Barrett's esophagus  . ESOPHAGOGASTRODUODENOSCOPY  2011   SLF: large hh, distal esophageal web narrowing to 101mm s/p dilation to 77mm  . ESOPHAGOGASTRODUODENOSCOPY N/A 08/06/2013   SLF: 1. Stricture at the gastroesophageal junction 2. large hiatal hernia. 3. Mild erosive gastritis.  Marland Kitchen GIVENS CAPSULE STUDY N/A 08/06/2013   INCOMPLETE-SMALL BOWLE ULCERS  . KNEE SURGERY Right   . PARTIAL HYSTERECTOMY  1978  . small bowel capsule  2008   negative  . SPINE SURGERY    . TONSILLECTOMY AND ADENOIDECTOMY    . Two back surgeries/fusion    . UMBILICAL HERNIA REPAIR  2010    Social History   Socioeconomic History  . Marital status: Widowed    Spouse name: Lonardo  . Number of children: 4  . Years of education: Not on file  . Highest education level: 10th grade  Occupational History  . 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 Financial resource strain: Not on file  . Food insecurity    Worry: Not on file    Inability: Not on file  . Transportation needs    Medical: Not on file    Non-medical: Not on file  Tobacco Use  . Smoking status: Former Smoker    Packs/day: 1.00    Years: 1.00    Pack years: 1.00    Types: Cigarettes    Start date: 02/19/1961    Quit date: 08/05/1961    Years since quitting: 57.9  . Smokeless tobacco: Never Used  . Tobacco comment: Quit smoking x 50 years  Substance and Sexual Activity  . Alcohol use: No  . Drug use: No  . Sexual  activity: Not Currently  Lifestyle  . Physical activity    Days per week: Not on file    Minutes per session: Not on file  . Stress: Not on file  Relationships  . Social Herbalist on phone: Not on file    Gets together: Not on file    Attends religious service: Not on file    Active member of club or organization: Not on file    Attends meetings of clubs or organizations: Not on file    Relationship status: Not on file  . Intimate partner violence    Fear of current or ex partner: Not on file    Emotionally abused: Not on file    Physically abused: Not on file    Forced sexual activity: Not on file  Other Topics Concern  . Not on file  Social History Narrative   HAS 4 SON-GRAND [place].   Lives in home with Mole - married 13 Y   Cook, sew, quilt, crochet   Family History  Problem Relation Age of Onset  . Colon cancer Brother        diagnosed age 48. Living.   Marland Kitchen Ulcers Sister   . Diabetes Sister   . Heart attack Sister   . Kidney failure Sister   . Stroke Sister   . Ulcers Mother   . Diabetes Mother   . Heart attack Mother   . Stroke Mother   . Asthma Mother   . Heart disease Mother   . Cervical cancer Mother   . Heart attack Brother   . Heart disease Brother   . Asthma Sister   . Diabetes Brother   . Stroke Maternal Grandmother   . Heart attack Maternal Grandmother   . Heart attack Other   . Early death Father        MVA in his 40s      VITAL SIGNS BP 104/62   Pulse 73   Temp 98.3 F (36.8 C) (Oral)   Resp 20   Ht 4\' 8"  (1.422 m)   Wt 210 lb 1.6 oz (95.3 kg)   SpO2 93%   BMI 47.10 kg/m   Outpatient Encounter Medications as of 06/28/2019  Medication Sig  . albuterol (PROVENTIL) (2.5 MG/3ML) 0.083% nebulizer solution Take 2.5 mg by nebulization every 6 (six) hours as needed for wheezing or shortness of breath.   Marland Kitchen albuterol (VENTOLIN HFA) 108 (90 Base) MCG/ACT inhaler Inhale 2 puffs into the lungs every 6 (six) hours  as needed for wheezing or shortness of breath.   . allopurinol (ZYLOPRIM) 100 MG tablet Take 100 mg  by mouth 2 (two) times daily.   Marland Kitchen amitriptyline (ELAVIL) 100 MG tablet Take 100 mg by mouth at bedtime.    Marland Kitchen apixaban (ELIQUIS) 5 MG TABS tablet Take 1 tablet (5 mg total) by mouth 2 (two) times daily.  Marland Kitchen aspirin EC 81 MG tablet Take 81 mg by mouth daily.  Marland Kitchen atorvastatin (LIPITOR) 40 MG tablet Take 1 tablet (40 mg total) by mouth at bedtime.  Marland Kitchen BIOTIN PO Take 1 capsule by mouth daily.  Marland Kitchen diltiazem (CARDIZEM CD) 120 MG 24 hr capsule Take 1 capsule (120 mg total) by mouth daily.  Marland Kitchen esomeprazole (NEXIUM) 40 MG capsule TAKE 1 CAPSULE TWICE A DAY 30 MINUTES PRIOR TO MEALS  . famotidine (PEPCID) 40 MG tablet TAKE 1 TABLET AT BEDTIME AS NEEDED TO CONTROL REFLUX  . guaiFENesin (MUCINEX) 600 MG 12 hr tablet Take 1 tablet (600 mg total) by mouth 2 (two) times daily.  . hydrALAZINE (APRESOLINE) 25 MG tablet Take 25 mg by mouth 2 (two) times daily.  . insulin lispro (HUMALOG KWIKPEN) 100 UNIT/ML KwikPen Inject 5 Units into the skin 3 (three) times daily.  Marland Kitchen LANTUS SOLOSTAR 100 UNIT/ML SOPN Inject 8 Units into the skin at bedtime.   Marland Kitchen loperamide (IMODIUM) 2 MG capsule Take 2 mg by mouth every 6 (six) hours as needed for diarrhea or loose stools.   . metoprolol succinate (TOPROL-XL) 25 MG 24 hr tablet Take 25 mg by mouth daily. Along with 50 mg to = 75 mg  . metoprolol succinate (TOPROL-XL) 50 MG 24 hr tablet Take 50 mg by mouth daily. Along with 25 mg to = 75 mg  . mirtazapine (REMERON) 7.5 MG tablet Take 7.5 mg by mouth at bedtime.  . NON FORMULARY Diet; Regular, NAS, Consistent Carbohydrate  . OXYGEN Inhale 2 L into the lungs. Every Shift  . torsemide (DEMADEX) 10 MG tablet Take 3 tablets (30 mg total) by mouth 2 (two) times daily.  . TRELEGY ELLIPTA 100-62.5-25 MCG/INH AEPB Inhale 1 puff into the lungs daily.  . [DISCONTINUED] metolazone (ZAROXOLYN) 2.5 MG tablet Take 1 tablet (2.5 mg total) by mouth every  Monday, Wednesday, and Friday.   No facility-administered encounter medications on file as of 06/28/2019.      SIGNIFICANT DIAGNOSTIC EXAMS   PREVIOUS;  05-31-19: ct of abdomen and pelvis:  Large hiatal hernia stable from the prior exam. Some wall thickening in the stomach is noted suspicious for gastritis. No obstructive changes are noted. Chronic changes similar to that seen on the prior exam. Changes of anasarca stable from the previous study.  05-31-19: chest x-ray:  1. Irregularity along the right heart border likely corresponds to the large hiatal hernia seen on CT of the abdomen pelvis. 2. Bibasilar atelectasis. No other acute cardiopulmonary abnormality. Increased attenuation of the bases likely related to body habitus. 3. Slightly lobular configuration of the heart could reflect the small volume pericardial fluid seen on CT abdomen pelvis.  06-15-19: chest x-ray; No active disease. Similar appearance of large hiatal hernia, mild cardiomegaly, and basilar scarring  06-16-19: 2-echo:   1. Left ventricular ejection fraction, by visual estimation, is 55 to 60%. The left ventricle has normal function. There is no left ventricular hypertrophy.  2. Elevated left atrial pressure.  3. Left ventricular diastolic parameters are consistent with Grade II diastolic dysfunction (pseudonormalization).  4. Global right ventricle has normal systolic function.The right ventricular size is normal. No increase in right ventricular wall thickness.  5. Left atrial size was moderately  dilated.  6. Right atrial size was normal.  7. The mitral valve was not well visualized. Trace mitral valve regurgitation. No evidence of mitral stenosis.  8. The tricuspid valve is normal in structure. Tricuspid valve regurgitation is mild.  9. The aortic valve was not well visualized. Aortic valve regurgitation is not visualized. No evidence of aortic valve sclerosis or stenosis. 10. The pulmonic valve was not well  visualized. Pulmonic valve regurgitation not assessed. 11. The aortic root was not well visualized. 12. Moderately elevated pulmonary artery systolic pressure. 13. Moderate pulmonary HTN, PASP is 48 mmHg. 14. The inferior vena cava is normal in size with greater than 50% respiratory variability, suggesting right atrial pressure of 3 mmHg. 15. The interatrial septum was not well visualized.  NO NEW EXAMS.    LABS REVIEWED: PREVIOUS  06-15-19: wbc 10.7; hgb 11.2; hct 36.8; mcv 88.5 plt 250; glucose 181; bun 64; creat 2.05; k+ 3.9; na++ 138; ca 8.2; BNP 327.0 06-17-19: glucose 122; bun 65; creat 1.263; k+ 4.1; na++ 141; ca 8.1; mag 1.6 06-19-19: glucose 117; bun 65; creat 2.44; k+ 3.9; na++ 142; ca 8.2   TODAY;   06-28-19: wbc 11.9; hgb 10.0; hct 33.1; mvc 89.0 plt 239; glucose 34; bun 85; creat 3.42; k+ 3.7; na++ 140; ca 8.0   Review of Systems  Constitutional: Negative for malaise/fatigue.  Respiratory: Positive for shortness of breath. Negative for cough.   Cardiovascular: Negative for chest pain, palpitations and leg swelling.  Gastrointestinal: Negative for abdominal pain, constipation and heartburn.  Musculoskeletal: Negative for back pain, joint pain and myalgias.  Skin: Negative.   Neurological: Negative for dizziness.  Psychiatric/Behavioral: The patient is not nervous/anxious.       Physical Exam Constitutional:      General: She is not in acute distress.    Appearance: She is well-developed. She is morbidly obese. She is not diaphoretic.  Neck:     Musculoskeletal: Neck supple.     Thyroid: No thyromegaly.  Cardiovascular:     Rate and Rhythm: Normal rate and regular rhythm.     Pulses: Normal pulses.     Heart sounds: Normal heart sounds.  Pulmonary:     Effort: Pulmonary effort is normal. No respiratory distress.     Breath sounds: Normal breath sounds.     Comments: 02 dependent Using CPAP at night Abdominal:     General: Bowel sounds are normal. There is no  distension.     Palpations: Abdomen is soft.     Tenderness: There is no abdominal tenderness.  Musculoskeletal:     Right lower leg: Edema present.     Left lower leg: Edema present.     Comments: 2 + bilateral lower extremity edema Is able to move all extremities   Lymphadenopathy:     Cervical: No cervical adenopathy.  Skin:    General: Skin is warm and dry.     Comments: Bilateral lower extremities discolored   Neurological:     Mental Status: She is alert and oriented to person, place, and time.  Psychiatric:        Mood and Affect: Mood normal.        ASSESSMENT/ PLAN:  TODAY;   1. Chronic diastolic congestive heart failure 2. CKD stage 3 due to type 2 diabetes mellitus 3. DM type 2 with peripheral neuropathy 4. Anorexia  Will lower lantus to 8 units nightly  Will stop zaroxolyn Will begin remeron 75 mg nighlty through 07-12-19 for her appetite Will  continue to monitor her status.     MD is aware of resident's narcotic use and is in agreement with current plan of care. We will attempt to wean resident as appropriate.  Ok Edwards NP [facility]  Contact [phone] Monday through Friday 8am- 5pm  After hours call [phone]

## 2019-06-29 ENCOUNTER — Encounter: Payer: Self-pay | Admitting: Adult Health

## 2019-06-29 ENCOUNTER — Encounter (HOSPITAL_COMMUNITY)
Admission: RE | Admit: 2019-06-29 | Discharge: 2019-06-29 | Disposition: A | Discharge status: Home / Self Care | Payer: Medicare Other | Source: Skilled Nursing Facility | Attending: Adult Health | Admitting: Adult Health

## 2019-06-29 ENCOUNTER — Non-Acute Institutional Stay (SKILLED_NURSING_FACILITY): Payer: Medicare Other | Admitting: Adult Health

## 2019-06-29 DIAGNOSIS — I130 Hypertensive heart and chronic kidney disease with heart failure and stage 1 through stage 4 chronic kidney disease, or unspecified chronic kidney disease: Secondary | ICD-10-CM

## 2019-06-29 DIAGNOSIS — N1832 Chronic kidney disease, stage 3b: Secondary | ICD-10-CM | POA: Diagnosis not present

## 2019-06-29 DIAGNOSIS — I4892 Unspecified atrial flutter: Secondary | ICD-10-CM | POA: Diagnosis not present

## 2019-06-29 DIAGNOSIS — I5032 Chronic diastolic (congestive) heart failure: Secondary | ICD-10-CM | POA: Diagnosis not present

## 2019-06-29 DIAGNOSIS — M5186 Other intervertebral disc disorders, lumbar region: Secondary | ICD-10-CM | POA: Insufficient documentation

## 2019-06-29 DIAGNOSIS — G8929 Other chronic pain: Secondary | ICD-10-CM | POA: Insufficient documentation

## 2019-06-29 DIAGNOSIS — D500 Iron deficiency anemia secondary to blood loss (chronic): Secondary | ICD-10-CM | POA: Insufficient documentation

## 2019-06-29 DIAGNOSIS — M25522 Pain in left elbow: Secondary | ICD-10-CM | POA: Insufficient documentation

## 2019-06-29 NOTE — Progress Notes (Signed)
Location:    [facility] Room Number: 159/P Place of Service:  SNF (31)   CODE STATUS: Full Code  No Known Allergies  Chief Complaint  Patient presents with  . 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      HPI:  She is a 73 year old short term rehab patient being seen for the management of her chronic illnesses: chf; atrial flutter; hypertensive heart diease. She doe shave some shortness of breath. Sh denies any changes in her appetite. She denies any anxiety or depressive thoughts. She does participate in theapy.   Past Medical History:  Diagnosis Date  . Blood transfusion without reported diagnosis   . CAD (coronary artery disease)    Nonobstructive at cardiac catheterization 2000  . Cataract   . Cervical cancer ([place]) 1978  . CHF (congestive heart failure) ([place])   . Chronic back pain   . Chronic kidney disease   . COPD (chronic obstructive pulmonary disease) ([place])   . Degenerative disc disease   . Essential hypertension, benign   . GERD (gastroesophageal reflux disease)   . Gout   . Hiatal hernia 07/27/2013  . History of diverticulitis of colon   . History of hiatal hernia   . Iron deficiency anemia   . Irritable bowel syndrome   . Lumbar radiculopathy   . Mixed hyperlipidemia   . Neuropathy   . Osteoporosis   . Ovarian cancer [facility]) 1978   patient denies. States this was cervical cancer  . Oxygen deficiency   . Sleep apnea   . Type 2 diabetes mellitus ([place])   . Vitamin B deficiency 12/25/2009  . Vitamin B12 deficiency     Past Surgical History:  Procedure Laterality Date  . ABDOMINAL HYSTERECTOMY    . BACK SURGERY    . Benign breast cysts    .  CHOLECYSTECTOMY    . COLONOSCOPY  10/01/2006   SLF:Pan colonic diverticulosis and moderate internal hemorrhoids/ Otherwise no polyps, masses, inflammatory changes or AVMs/  . COLONOSCOPY  2011   SLF: pancolonic diverticulosis, large internal hemorrhoids  . COLONOSCOPY N/A 01/26/2016   Procedure: COLONOSCOPY;  Surgeon: [name], MD;  Location: [facility] END[facility];  Service: Endoscopy;  Laterality: N/A;  830   . ESOPHAGOGASTRODUODENOSCOPY  11/19/2006   SLF: Large hiatal hernia without evidence of Cameron ulcers/. Distal esophageal stricture, which allowed the gastroscope to pass without resistance.  A 16 mm Savary later passed with mild resistance/ Normal stomach.sb bx negative  . ESOPHAGOGASTRODUODENOSCOPY  10/01/2006   TSV:[id number] hiatal hernia.  Distal esophagus without evidence of   erythema, ulceration or Barrett's esophagus  . ESOPHAGOGASTRODUODENOSCOPY  2011   SLF: large hh, distal esophageal web narrowing to 24mm s/p dilation to 75mm  . ESOPHAGOGASTRODUODENOSCOPY N/A 08/06/2013   SLF: 1. Stricture at the gastroesophageal junction 2. large hiatal hernia. 3. Mild erosive gastritis.  Marland Kitchen GIVENS CAPSULE STUDY N/A 08/06/2013   INCOMPLETE-SMALL BOWLE ULCERS  . KNEE SURGERY Right   . PARTIAL HYSTERECTOMY  1978  . small bowel capsule  2008   negative  . SPINE SURGERY    . TONSILLECTOMY AND ADENOIDECTOMY    . Two back surgeries/fusion    .  UMBILICAL HERNIA REPAIR  2010    Social History   Socioeconomic History  . Marital status: Widowed    Spouse name: Barcelo  . Number of children: 4  . Years of education: Not on file  . Highest education level: 10th grade  Occupational History  . 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 Financial resource strain: Not on file  . Food insecurity    Worry: Not on file    Inability: Not on file  . Transportation needs    Medical: Not on file    Non-medical: Not on file  Tobacco Use  . Smoking status: Former Smoker     Packs/day: 1.00    Years: 1.00    Pack years: 1.00    Types: Cigarettes    Start date: 02/19/1961    Quit date: 08/05/1961    Years since quitting: 57.9  . Smokeless tobacco: Never Used  . Tobacco comment: Quit smoking x 50 years  Substance and Sexual Activity  . Alcohol use: No  . Drug use: No  . Sexual activity: Not Currently  Lifestyle  . Physical activity    Days per week: Not on file    Minutes per session: Not on file  . Stress: Not on file  Relationships  . Social Herbalist on phone: Not on file    Gets together: Not on file    Attends religious service: Not on file    Active member of club or organization: Not on file    Attends meetings of clubs or organizations: Not on file    Relationship status: Not on file  . Intimate partner violence    Fear of current or ex partner: Not on file    Emotionally abused: Not on file    Physically abused: Not on file    Forced sexual activity: Not on file  Other Topics Concern  . Not on file  Social History Narrative   HAS 4 SON-GRAND [place].   Lives in home with [place] - married 13 Y   Cook, sew, quilt, crochet   Family History  Problem Relation Age of Onset  . Colon cancer Brother        diagnosed age 43. Living.   Marland Kitchen Ulcers Sister   . Diabetes Sister   . Heart attack Sister   . Kidney failure Sister   . Stroke Sister   . Ulcers Mother   . Diabetes Mother   . Heart attack Mother   . Stroke Mother   . Asthma Mother   . Heart disease Mother   . Cervical cancer Mother   . Heart attack Brother   . Heart disease Brother   . Asthma Sister   . Diabetes Brother   . Stroke Maternal Grandmother   . Heart attack Maternal Grandmother   . Heart attack Other   . Early death Father        MVA in his 51s      VITAL SIGNS BP 130/81   Pulse 88   Temp 98.2 F (36.8 C) (Oral)   Resp 20   Ht 4\' 8"  (1.422 m)   Wt 210 lb 1.6 oz (95.3 kg)   SpO2 92%   BMI 47.10 kg/m   Outpatient Encounter  Medications as of 06/29/2019  Medication Sig  . albuterol (PROVENTIL) (2.5 MG/3ML) 0.083% nebulizer solution Take 2.5 mg by nebulization every 6 (six) hours as  needed for wheezing or shortness of breath.   Marland Kitchen albuterol (VENTOLIN HFA) 108 (90 Base) MCG/ACT inhaler Inhale 2 puffs into the lungs every 6 (six) hours as needed for wheezing or shortness of breath.   . allopurinol (ZYLOPRIM) 100 MG tablet Take 100 mg by mouth 2 (two) times daily.   Marland Kitchen amitriptyline (ELAVIL) 100 MG tablet Take 100 mg by mouth at bedtime.    Marland Kitchen apixaban (ELIQUIS) 5 MG TABS tablet Take 1 tablet (5 mg total) by mouth 2 (two) times daily.  Marland Kitchen aspirin EC 81 MG tablet Take 81 mg by mouth daily.  Marland Kitchen atorvastatin (LIPITOR) 40 MG tablet Take 1 tablet (40 mg total) by mouth at bedtime.  Marland Kitchen BIOTIN PO Take 1 capsule by mouth daily.  Marland Kitchen diltiazem (CARDIZEM CD) 120 MG 24 hr capsule Take 1 capsule (120 mg total) by mouth daily.  Marland Kitchen esomeprazole (NEXIUM) 40 MG capsule TAKE 1 CAPSULE TWICE A DAY 30 MINUTES PRIOR TO MEALS  . famotidine (PEPCID) 40 MG tablet TAKE 1 TABLET AT BEDTIME AS NEEDED TO CONTROL REFLUX  . guaiFENesin (MUCINEX) 600 MG 12 hr tablet Take 1 tablet (600 mg total) by mouth 2 (two) times daily.  . hydrALAZINE (APRESOLINE) 25 MG tablet Take 25 mg by mouth 2 (two) times daily.  . insulin lispro (HUMALOG KWIKPEN) 100 UNIT/ML KwikPen Inject 5 Units into the skin 3 (three) times daily.  Marland Kitchen LANTUS SOLOSTAR 100 UNIT/ML SOPN Inject 8 Units into the skin at bedtime.   Marland Kitchen loperamide (IMODIUM) 2 MG capsule Take 2 mg by mouth every 6 (six) hours as needed for diarrhea or loose stools.   . metoprolol succinate (TOPROL-XL) 25 MG 24 hr tablet Take 25 mg by mouth daily. Along with 50 mg to = 75 mg  . metoprolol succinate (TOPROL-XL) 50 MG 24 hr tablet Take 50 mg by mouth daily. Along with 25 mg to = 75 mg  . mirtazapine (REMERON) 7.5 MG tablet Take 7.5 mg by mouth at bedtime.  . NON FORMULARY Diet; Regular, NAS, Consistent Carbohydrate  . OXYGEN  Inhale 2 L into the lungs. Every Shift  . torsemide (DEMADEX) 10 MG tablet Take 3 tablets (30 mg total) by mouth 2 (two) times daily.  . TRELEGY ELLIPTA 100-62.5-25 MCG/INH AEPB Inhale 1 puff into the lungs daily.   No facility-administered encounter medications on file as of 06/29/2019.      SIGNIFICANT DIAGNOSTIC EXAMS   PREVIOUS;  05-31-19: ct of abdomen and pelvis:  Large hiatal hernia stable from the prior exam. Some wall thickening in the stomach is noted suspicious for gastritis. No obstructive changes are noted. Chronic changes similar to that seen on the prior exam. Changes of anasarca stable from the previous study.  05-31-19: chest x-ray:  1. Irregularity along the right heart border likely corresponds to the large hiatal hernia seen on CT of the abdomen pelvis. 2. Bibasilar atelectasis. No other acute cardiopulmonary abnormality. Increased attenuation of the bases likely related to body habitus. 3. Slightly lobular configuration of the heart could reflect the small volume pericardial fluid seen on CT abdomen pelvis.  06-15-19: chest x-ray; No active disease. Similar appearance of large hiatal hernia, mild cardiomegaly, and basilar scarring  06-16-19: 2-echo:   1. Left ventricular ejection fraction, by visual estimation, is 55 to 60%. The left ventricle has normal function. There is no left ventricular hypertrophy.  2. Elevated left atrial pressure.  3. Left ventricular diastolic parameters are consistent with Grade II diastolic dysfunction (pseudonormalization).  4.  Global right ventricle has normal systolic function.The right ventricular size is normal. No increase in right ventricular wall thickness.  5. Left atrial size was moderately dilated.  6. Right atrial size was normal.  7. The mitral valve was not well visualized. Trace mitral valve regurgitation. No evidence of mitral stenosis.  8. The tricuspid valve is normal in structure. Tricuspid valve regurgitation is  mild.  9. The aortic valve was not well visualized. Aortic valve regurgitation is not visualized. No evidence of aortic valve sclerosis or stenosis. 10. The pulmonic valve was not well visualized. Pulmonic valve regurgitation not assessed. 11. The aortic root was not well visualized. 12. Moderately elevated pulmonary artery systolic pressure. 13. Moderate pulmonary HTN, PASP is 48 mmHg. 14. The inferior vena cava is normal in size with greater than 50% respiratory variability, suggesting right atrial pressure of 3 mmHg. 15. The interatrial septum was not well visualized.  NO NEW EXAMS.    LABS REVIEWED: PREVIOUS  06-15-19: wbc 10.7; hgb 11.2; hct 36.8; mcv 88.5 plt 250; glucose 181; bun 64; creat 2.05; k+ 3.9; na++ 138; ca 8.2; BNP 327.0 06-17-19: glucose 122; bun 65; creat 1.263; k+ 4.1; na++ 141; ca 8.1; mag 1.6 06-19-19: glucose 117; bun 65; creat 2.44; k+ 3.9; na++ 142; ca 8.2  06-28-19: wbc 11.9; hgb 10.0; hct 33.1; mvc 89.0 plt 239; glucose 34; bun 85; creat 3.42; k+ 3.7; na++ 140; ca 8.0   NO NEW LABS.   Review of Systems  Constitutional: Negative for malaise/fatigue.  Respiratory: Negative for cough and shortness of breath.   Cardiovascular: Negative for chest pain, palpitations and leg swelling.  Gastrointestinal: Negative for abdominal pain, constipation and heartburn.  Musculoskeletal: Negative for back pain, joint pain and myalgias.  Skin: Negative.   Neurological: Negative for dizziness.  Psychiatric/Behavioral: The patient is not nervous/anxious.     Physical Exam Constitutional:      General: She is not in acute distress.    Appearance: She is well-developed. She is not diaphoretic.  Neck:     Musculoskeletal: Neck supple.     Thyroid: No thyromegaly.  Cardiovascular:     Rate and Rhythm: Normal rate and regular rhythm.     Pulses: Normal pulses.     Heart sounds: Normal heart sounds.  Pulmonary:     Effort: Pulmonary effort is normal. No respiratory distress.      Breath sounds: Normal breath sounds.     Comments: 02 dependent  CPAP at night Abdominal:     General: Bowel sounds are normal. There is no distension.     Palpations: Abdomen is soft.     Tenderness: There is no abdominal tenderness.  Musculoskeletal:     Right lower leg: Edema present.     Left lower leg: Edema present.     Comments: 2 + bilateral lower extremity edema Is able to move all extremities    Lymphadenopathy:     Cervical: No cervical adenopathy.  Skin:    General: Skin is warm and dry.     Comments: bilateral lower extremities discolored   Neurological:     Mental Status: She is alert and oriented to person, place, and time.  Psychiatric:        Mood and Affect: Mood normal.      ASSESSMENT/ PLAN:  TODAY;   1. Chronic diastolic CHF (congestive heart failure) is stable EF 55-60% (06-16-19) will continue demadex 30 mg twice daily apresoline 25 mg twice daily toprol xl 75 mg daily  2. Atrial flutter with rapid ventricular response heart rate is stable has converted to nsr; will continue cardizem cd 120 mg and toprol xl 75 mg daily  daily for rate control eliquis 5 mg twice daily   3. 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 OSA on CPAP: is stable is on CPAP nightly   5. Obstructive chronic  bronchitis without exacerbation/chronic respiratory failure with hypoxia: is without  is 02 dependent; will continue trelegy 100/62.5/25 mcg 1 puff daily; mucinex 600 mg twice daily  has albuterol 2 puffs/neb every 6 hours as needed    6. Hiatal hernia with GERD without esophagitis: is stable will continue nexium 40 mg twice daily and has pepcid 40 mg daily as needed  7. DM type 2 with peripheral neuropathy: is stable will continue humalog 5 units with meals and lantus  16 units nightly   8. CKD stage 3 due to type 2  diabetes mellitus: is stable bun 65 creat 2.44 will monitor  9. Neuropathy due to type 2 diabetes mellitus: is stable will continue elavil 100 mg nightly   10. 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 Iron deficiency anemia due to chronic blood loss: is stable hgb 11.2 will monitor  12. Dyslipidemia associated with type 2 diabetes mellitus: is stable will continue lipitor 40 mg daily   13. Chronic gout 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 unspecified site: is stable no recent flares will continue allopurinol 100 mg twice daily     MD is aware of resident's narcotic use and is in agreement with current plan of care. We will attempt to wean resident as appropriate.  Ok Edwards NP [facility]  Contact [phone] Monday through Friday 8am- 5pm  After hours call [phone]

## 2019-06-30 ENCOUNTER — Encounter: Payer: Self-pay | Admitting: Adult Health

## 2019-06-30 ENCOUNTER — Ambulatory Visit: Payer: 59 | Admitting: Gastroenterology

## 2019-06-30 ENCOUNTER — Non-Acute Institutional Stay (SKILLED_NURSING_FACILITY): Payer: Medicare Other | Admitting: Internal Medicine

## 2019-06-30 ENCOUNTER — Encounter (HOSPITAL_COMMUNITY)
Admission: RE | Admit: 2019-06-30 | Discharge: 2019-06-30 | Disposition: A | Discharge status: Home / Self Care | Payer: Medicare Other | Source: Skilled Nursing Facility | Attending: *Deleted | Admitting: *Deleted

## 2019-06-30 DIAGNOSIS — R609 Edema, unspecified: Secondary | ICD-10-CM | POA: Diagnosis not present

## 2019-06-30 DIAGNOSIS — R635 Abnormal weight gain: Secondary | ICD-10-CM | POA: Diagnosis not present

## 2019-06-30 DIAGNOSIS — I5033 Acute on chronic diastolic (congestive) heart failure: Secondary | ICD-10-CM | POA: Diagnosis not present

## 2019-06-30 LAB — CBC
HCT: 29.2 % — ABNORMAL LOW (ref 36.0–46.0)
Hemoglobin: 9 g/dL — ABNORMAL LOW (ref 12.0–15.0)
MCH: 27.4 pg (ref 26.0–34.0)
MCHC: 30.8 g/dL (ref 30.0–36.0)
MCV: 89 fL (ref 80.0–100.0)
Platelets: 275 10*3/uL (ref 150–400)
RBC: 3.28 MIL/uL — ABNORMAL LOW (ref 3.87–5.11)
RDW: 14.6 % (ref 11.5–15.5)
WBC: 13.6 10*3/uL — ABNORMAL HIGH (ref 4.0–10.5)
nRBC: 0 % (ref 0.0–0.2)

## 2019-06-30 LAB — BASIC METABOLIC PANEL
Anion gap: 17 — ABNORMAL HIGH (ref 5–15)
BUN: 105 mg/dL — ABNORMAL HIGH (ref 8–23)
CO2: 36 mmol/L — ABNORMAL HIGH (ref 22–32)
Calcium: 7.8 mg/dL — ABNORMAL LOW (ref 8.9–10.3)
Chloride: 89 mmol/L — ABNORMAL LOW (ref 98–111)
Creatinine, Ser: 3.43 mg/dL — ABNORMAL HIGH (ref 0.44–1.00)
GFR calc Af Amer: 14 mL/min — ABNORMAL LOW (ref >60)
GFR calc non Af Amer: 12 mL/min — ABNORMAL LOW (ref >60)
Glucose, Bld: 110 mg/dL — ABNORMAL HIGH (ref 70–99)
Potassium: 3.2 mmol/L — ABNORMAL LOW (ref 3.5–5.1)
Sodium: 142 mmol/L (ref 135–145)

## 2019-06-30 NOTE — Progress Notes (Signed)
Location:    [facility] Room Number: 159/P Place of Service:  SNF (31)   CODE STATUS: Full Code  No Known Allergies  Chief Complaint  Patient presents with  . Acute Visit    Change in Status    HPI:    Past Medical History:  Diagnosis Date  . Blood transfusion without reported diagnosis   . CAD (coronary artery disease)    Nonobstructive at cardiac catheterization 2000  . Cataract   . Cervical cancer ([place]) 1978  . CHF (congestive heart failure) ([place])   . Chronic back pain   . Chronic kidney disease   . COPD (chronic obstructive pulmonary disease) ([place])   . Degenerative disc disease   . Essential hypertension, benign   . GERD (gastroesophageal reflux disease)   . Gout   . Hiatal hernia 07/27/2013  . History of diverticulitis of colon   . History of hiatal hernia   . Iron deficiency anemia   . Irritable bowel syndrome   . Lumbar radiculopathy   . Mixed hyperlipidemia   . Neuropathy   . Osteoporosis   . Ovarian cancer [facility]) 1978   patient denies. States this was cervical cancer  . Oxygen deficiency   . Sleep apnea   . Type 2 diabetes mellitus ([place])   . Vitamin B deficiency 12/25/2009  . Vitamin B12 deficiency     Past Surgical History:  Procedure Laterality Date  . ABDOMINAL HYSTERECTOMY    . BACK SURGERY    . Benign breast cysts    . CHOLECYSTECTOMY    . COLONOSCOPY  10/01/2006   SLF:Pan colonic diverticulosis and moderate internal hemorrhoids/ Otherwise no polyps, masses, inflammatory changes or AVMs/  . COLONOSCOPY  2011   SLF: pancolonic diverticulosis, large internal hemorrhoids  . COLONOSCOPY N/A 01/26/2016   Procedure: COLONOSCOPY;  Surgeon: [name], MD;  Location: [facility];  Service: Endoscopy;  Laterality: N/A;  830   . ESOPHAGOGASTRODUODENOSCOPY  11/19/2006   SLF: Large hiatal hernia without evidence of Cameron ulcers/. Distal esophageal stricture, which allowed the gastroscope to pass without resistance.  A  16 mm Savary later passed with mild resistance/ Normal stomach.sb bx negative  . ESOPHAGOGASTRODUODENOSCOPY  10/01/2006   NAT:FTDDU hiatal hernia.  Distal esophagus without evidence of   erythema, ulceration or Barrett's esophagus  . ESOPHAGOGASTRODUODENOSCOPY  2011   SLF: large hh, distal esophageal web narrowing to 38mm s/p dilation to 19mm  . ESOPHAGOGASTRODUODENOSCOPY N/A 08/06/2013   SLF: 1. Stricture at the gastroesophageal junction 2. large hiatal hernia. 3. Mild erosive gastritis.  Marland Kitchen GIVENS CAPSULE STUDY N/A 08/06/2013   INCOMPLETE-SMALL BOWLE ULCERS  . KNEE SURGERY Right   . PARTIAL HYSTERECTOMY  1978  . small bowel capsule  2008   negative  . SPINE SURGERY    . TONSILLECTOMY AND ADENOIDECTOMY    . Two back surgeries/fusion    . UMBILICAL HERNIA REPAIR  2010    Social History   Socioeconomic History  . Marital status: Widowed    Spouse name: Dykman  . Number of children: 4  . Years of education: Not on file  . Highest education level: 10th grade  Occupational History  . 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 Financial resource strain: Not on file  . Food insecurity    Worry: Not on file    Inability: Not on file  . Transportation needs    Medical: Not on file  Non-medical: Not on file  Tobacco Use  . Smoking status: Former Smoker    Packs/day: 1.00    Years: 1.00    Pack years: 1.00    Types: Cigarettes    Start date: 02/19/1961    Quit date: 08/05/1961    Years since quitting: 57.9  . Smokeless tobacco: Never Used  . Tobacco comment: Quit smoking x 50 years  Substance and Sexual Activity  . Alcohol use: No  . Drug use: No  . Sexual activity: Not Currently  Lifestyle  . Physical activity    Days per week: Not on file    Minutes per session: Not on file  . Stress: Not on file  Relationships  . Social Herbalist on phone: Not on file    Gets together: Not on file    Attends religious service: Not on file     Active member of club or organization: Not on file    Attends meetings of clubs or organizations: Not on file    Relationship status: Not on file  . Intimate partner violence    Fear of current or ex partner: Not on file    Emotionally abused: Not on file    Physically abused: Not on file    Forced sexual activity: Not on file  Other Topics Concern  . Not on file  Social History Narrative   HAS 4 SON-GRAND [place].   Lives in home with [place] - married 13 Y   Cook, sew, quilt, crochet   Family History  Problem Relation Age of Onset  . Colon cancer Brother        diagnosed age 36. Living.   Marland Kitchen Ulcers Sister   . Diabetes Sister   . Heart attack Sister   . Kidney failure Sister   . Stroke Sister   . Ulcers Mother   . Diabetes Mother   . Heart attack Mother   . Stroke Mother   . Asthma Mother   . Heart disease Mother   . Cervical cancer Mother   . Heart attack Brother   . Heart disease Brother   . Asthma Sister   . Diabetes Brother   . Stroke Maternal Grandmother   . Heart attack Maternal Grandmother   . Heart attack Other   . Early death Father        MVA in his 35s      VITAL SIGNS BP 132/76   Pulse 87   Temp (!) 97.2 F (36.2 C) (Oral)   Resp 20   Ht 4\' 8"  (1.422 m)   Wt 208 lb 3.2 oz (94.4 kg)   SpO2 94%   BMI 46.68 kg/m   Outpatient Encounter Medications as of 06/30/2019  Medication Sig  . albuterol (PROVENTIL) (2.5 MG/3ML) 0.083% nebulizer solution Take 2.5 mg by nebulization every 6 (six) hours as needed for wheezing or shortness of breath.   Marland Kitchen albuterol (VENTOLIN HFA) 108 (90 Base) MCG/ACT inhaler Inhale 2 puffs into the lungs every 6 (six) hours as needed for wheezing or shortness of breath.   . allopurinol (ZYLOPRIM) 100 MG tablet Take 100 mg by mouth 2 (two) times daily.   Marland Kitchen amitriptyline (ELAVIL) 100 MG tablet Take 100 mg by mouth at bedtime.    Marland Kitchen apixaban (ELIQUIS) 5 MG TABS tablet Take 1 tablet (5 mg total) by mouth 2 (two)  times daily.  Marland Kitchen aspirin EC 81 MG tablet Take 81 mg by mouth daily.  Marland Kitchen  atorvastatin (LIPITOR) 40 MG tablet Take 1 tablet (40 mg total) by mouth at bedtime.  Marland Kitchen BIOTIN PO Take 1 capsule by mouth daily.  Marland Kitchen diltiazem (CARDIZEM CD) 120 MG 24 hr capsule Take 1 capsule (120 mg total) by mouth daily.  Marland Kitchen esomeprazole (NEXIUM) 40 MG capsule TAKE 1 CAPSULE TWICE A DAY 30 MINUTES PRIOR TO MEALS  . famotidine (PEPCID) 40 MG tablet TAKE 1 TABLET AT BEDTIME AS NEEDED TO CONTROL REFLUX  . feeding supplement, GLUCERNA SHAKE, (GLUCERNA SHAKE) LIQD Take 237 mLs by mouth 2 (two) times daily between meals.  Marland Kitchen guaiFENesin (MUCINEX) 600 MG 12 hr tablet Take 1 tablet (600 mg total) by mouth 2 (two) times daily.  . hydrALAZINE (APRESOLINE) 25 MG tablet Take 25 mg by mouth 2 (two) times daily.  . insulin lispro (HUMALOG KWIKPEN) 100 UNIT/ML KwikPen Inject 5 Units into the skin 3 (three) times daily.  Marland Kitchen LANTUS SOLOSTAR 100 UNIT/ML SOPN Inject 8 Units into the skin at bedtime.   Marland Kitchen loperamide (IMODIUM) 2 MG capsule Take 2 mg by mouth every 6 (six) hours as needed for diarrhea or loose stools.   . metoprolol succinate (TOPROL-XL) 25 MG 24 hr tablet Take 25 mg by mouth daily. Along with 50 mg to = 75 mg  . metoprolol succinate (TOPROL-XL) 50 MG 24 hr tablet Take 50 mg by mouth daily. Along with 25 mg to = 75 mg  . mirtazapine (REMERON) 7.5 MG tablet Take 7.5 mg by mouth at bedtime.  . NON FORMULARY Diet; Regular, NAS, Consistent Carbohydrate  . OXYGEN Inhale 2 L into the lungs. Every Shift  . potassium chloride SA (KLOR-CON) 20 MEQ tablet Take 20 mEq by mouth daily.  Derrill Memo ON 07/01/2019] torsemide (DEMADEX) 10 MG tablet Take 30 mg by mouth daily.  Derrill Memo ON 07/01/2019] torsemide (DEMADEX) 100 MG tablet Take 100 mg by mouth daily.  . TRELEGY ELLIPTA 100-62.5-25 MCG/INH AEPB Inhale 1 puff into the lungs daily.  . [DISCONTINUED] torsemide (DEMADEX) 10 MG tablet Take 3 tablets (30 mg total) by mouth 2 (two) times daily.  (Patient taking differently: Take 30 mg by mouth daily. )   No facility-administered encounter medications on file as of 06/30/2019.      SIGNIFICANT DIAGNOSTIC EXAMS       ASSESSMENT/ PLAN:    MD is aware of resident's narcotic use and is in agreement with current plan of care. We will attempt to wean resident as appropriate.  Ok Edwards NP [facility] Adult [facility]  Contact [phone] Monday through Friday 8am- 5pm  After hours call [phone]

## 2019-07-01 ENCOUNTER — Other Ambulatory Visit (HOSPITAL_COMMUNITY)
Admission: RE | Admit: 2019-07-01 | Discharge: 2019-07-01 | Disposition: A | Discharge status: Home / Self Care | Payer: Medicare Other | Source: Skilled Nursing Facility | Attending: Internal Medicine | Admitting: Internal Medicine

## 2019-07-01 DIAGNOSIS — R509 Fever, unspecified: Secondary | ICD-10-CM | POA: Diagnosis present

## 2019-07-02 ENCOUNTER — Encounter: Payer: Self-pay | Admitting: Internal Medicine

## 2019-07-02 DIAGNOSIS — R635 Abnormal weight gain: Secondary | ICD-10-CM | POA: Insufficient documentation

## 2019-07-02 DIAGNOSIS — R609 Edema, unspecified: Secondary | ICD-10-CM | POA: Insufficient documentation

## 2019-07-02 NOTE — Progress Notes (Signed)
Location:   [employer] of Service:   SNF Provider: Hennie Duos MD  [name], MD  Patient Care Team: [name], MD as PCP - General (Family Medicine) Satira Sark, MD as PCP - Cardiology (Cardiology) Elsie Stain, MD (Pulmonary Disease) [name], MD as Consulting Physician (Gastroenterology)  Extended Emergency Contact Information Primary Emergency Contact: Denny Peon, [place] Phone: [phone] Mobile Phone: [phone] Relation: Son Secondary Emergency Contact: [place] Mobile Phone: [phone] Relation: Son    Allergies: Patient has no known allergies.  Chief Complaint  Patient presents with  . Acute Visit    HPI: Patient is 73 y.o. female who nursing on the hall asked me to see for increased pedal edema.  Patient is on chronic O2.  When asked if patient is more short of breath than usual she said yes she is just a little.  Patient does have 1+ pitting edema.  Patient does have rails all over my prime in the right base, no wheezing.  Patient is currently on Demadex 30 mg twice daily.  Patient had labs done today which have returned with a creatinine of 3.43.  Past Medical History:  Diagnosis Date  . Blood transfusion without reported diagnosis   . CAD (coronary artery disease)    Nonobstructive at cardiac catheterization 2000  . Cataract   . Cervical cancer ([place]) 1978  . CHF (congestive heart failure) ([place])   . Chronic back pain   . Chronic kidney disease   . COPD (chronic obstructive pulmonary disease) ([place])   . Degenerative disc disease   . Essential hypertension, benign   . GERD (gastroesophageal reflux disease)   . Gout   . Hiatal hernia 07/27/2013  . History of diverticulitis of colon   . History of hiatal hernia   . Iron deficiency anemia   . Irritable bowel syndrome   . Lumbar radiculopathy   . Mixed hyperlipidemia   . Neuropathy   .  Osteoporosis   . Ovarian cancer [facility]) 1978   patient denies. States this was cervical cancer  . Oxygen deficiency   . Sleep apnea   . Type 2 diabetes mellitus ([place])   . Vitamin B deficiency 12/25/2009  . Vitamin B12 deficiency     Past Surgical History:  Procedure Laterality Date  . ABDOMINAL HYSTERECTOMY    . BACK SURGERY    . Benign breast cysts    . CHOLECYSTECTOMY    . COLONOSCOPY  10/01/2006   SLF:Pan colonic diverticulosis and moderate internal hemorrhoids/ Otherwise no polyps, masses, inflammatory changes or AVMs/  . COLONOSCOPY  2011   SLF: pancolonic diverticulosis, large internal hemorrhoids  . COLONOSCOPY N/A 01/26/2016   Procedure: COLONOSCOPY;  Surgeon: [name], MD;  Location: [facility];  Service: Endoscopy;  Laterality: N/A;  830   . ESOPHAGOGASTRODUODENOSCOPY  11/19/2006   SLF: Large hiatal hernia without evidence of Cameron ulcers/. Distal esophageal stricture, which allowed the gastroscope to pass without resistance.  A 16 mm Savary later passed with mild resistance/ Normal stomach.sb bx negative  . ESOPHAGOGASTRODUODENOSCOPY  10/01/2006   AST:MHDQQ hiatal hernia.  Distal esophagus without evidence of   erythema, ulceration or Barrett's esophagus  . ESOPHAGOGASTRODUODENOSCOPY  2011   SLF: large hh, distal esophageal web narrowing to 48mm s/p dilation to 37mm  . ESOPHAGOGASTRODUODENOSCOPY N/A 08/06/2013   SLF: 1. Stricture at the gastroesophageal junction 2. large  hiatal hernia. 3. Mild erosive gastritis.  Marland Kitchen GIVENS CAPSULE STUDY N/A 08/06/2013   INCOMPLETE-SMALL BOWLE ULCERS  . KNEE SURGERY Right   . PARTIAL HYSTERECTOMY  1978  . small bowel capsule  2008   negative  . SPINE SURGERY    . TONSILLECTOMY AND ADENOIDECTOMY    . Two back surgeries/fusion    . UMBILICAL HERNIA REPAIR  2010    Allergies as of 06/30/2019   No Known Allergies     Medication List    Notice   This visit is during an admission. Changes to the med list made in this visit  will be reflected in the After Visit Summary of the admission.     No orders of the defined types were placed in this encounter.   Immunization History  Administered Date(s) Administered  . Influenza Split 05/14/2011, 05/05/2013  . Influenza,inj,Quad PF,6+ Mos 06/20/2017, 05/05/2018, 05/27/2019  . Influenza-Unspecified 05/14/2011, 05/05/2013  . Pneumococcal Conjugate-13 03/07/2014  . Pneumococcal Polysaccharide-23 08/06/2007, 06/30/2013  . Tdap 08/13/2018    Social History   Tobacco Use  . Smoking status: Former Smoker    Packs/day: 1.00    Years: 1.00    Pack years: 1.00    Types: Cigarettes    Start date: 02/19/1961    Quit date: 08/05/1961    Years since quitting: 57.9  . Smokeless tobacco: Never Used  . Tobacco comment: Quit smoking x 50 years  Substance Use Topics  . Alcohol use: No    Review of Systems  GENERAL:  no fevers, fatigue, appetite changes SKIN: No itching, rash HEENT: No complaint RESPIRATORY: No cough, wheezing, some SOB CARDIAC: No chest pain, palpitations, +lower extremity edema  GI: No abdominal pain, No N/V/D or constipation, No heartburn or reflux  GU: No dysuria, frequency or urgency, or incontinence  MUSCULOSKELETAL: No unrelieved bone/joint pain NEUROLOGIC: No headache, dizziness  PSYCHIATRIC: No overt anxiety or sadness  Vitals:   07/02/19 2147  BP: 132/76  Pulse: (!) 115  Resp: 20  Temp: (!) 97.2 F (36.2 C)   Body mass index is 45.08 kg/m. Physical Exam  GENERAL APPEARANCE: Alert, minimally conversant, No acute distress, extremely obese SKIN: No diaphoresis rash HEENT: Unremarkable RESPIRATORY: Breathing is even, unlabored. Lung sounds are rales throughout, worse right base, no wheezes CARDIOVASCULAR: Heart regular, tachycardia no murmurs, rubs or gallops. 1+ peripheral edema  GASTROINTESTINAL: Abdomen is soft, non-tender, not distended w/ normal bowel sounds.  GENITOURINARY: Bladder non tender, not distended   MUSCULOSKELETAL: No abnormal joints or musculature NEUROLOGIC: Cranial nerves 2-12 grossly intact. Moves all extremities PSYCHIATRIC: Mood and affect appropriate to situation, no behavioral issues  Patient Active Problem List   Diagnosis Date Noted  . 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 CKD stage 3 due to type 2 diabetes mellitus ([place]) 06/22/2019  . 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 Dyslipidemia associated with type 2 diabetes mellitus ([place]) 06/22/2019  . Chronic gout 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 06/22/2019  . Quality of life palliative care encounter 06/19/2019  . Atrial fibrillation with RVR ([place])   . Atrial flutter with rapid ventricular response ([place]) 06/16/2019  . Dyspnea and respiratory abnormalities 06/01/2019  . Acute on chronic respiratory failure with hypoxia and hypercapnia with respiratory acidosis 06/01/2019  . Anasarca 05/31/2019  . Nausea without vomiting 05/31/2019  . Generalized abdominal pain 05/31/2019  . History of colonic polyps 05/31/2019  .  Lymphedema of both lower extremities 04/28/2019  . Chronic respiratory failure with hypoxia ([place]) 07/13/2018  . Acute renal failure superimposed on stage 3 chronic kidney disease ([place]) 07/13/2018  . Chronic diastolic CHF (congestive heart failure) ([place]) 07/13/2018  . COPD (chronic obstructive pulmonary disease) ([place]) 07/13/2018  . COPD exacerbation ([place]) 07/12/2018  . Flatulence 09/18/2017  . IBS (irritable bowel syndrome) 04/03/2017  . Gout 03/15/2017  . Acute on chronic diastolic CHF (congestive heart failure) ([place]) 05/08/2016  . CKD (chronic kidney disease) stage 3, GFR 30-59 ml/min 05/08/2016  . COPD with acute exacerbation ([place]) 05/08/2016  . Constipation 04/25/2016  . Peripheral vertigo 06/27/2015  . Port-A-Cath in place 10/06/2014  . Chronic obstructive pulmonary disease  ([place]) 04/30/2014  . Obesity, Class III, BMI 40-49.9 (morbid obesity) ([place]) 04/30/2014  . Hemorrhoids, internal, with bleeding 11/10/2013  . Anemia 08/18/2013  . Hiatal hernia with GERD and esophagitis   . Obstructive chronic bronchitis without exacerbation ([place]) 12/26/2009  . Dysphagia 12/26/2009  . DM type 2 with diabetic peripheral neuropathy ([place]) 12/25/2009  . Vitamin B deficiency 12/25/2009  . Iron deficiency anemia due to chronic blood loss 12/25/2009  . Benign essential HTN 12/25/2009  . DEGENERATIVE DISC DISEASE 12/25/2009  . OSA on CPAP 12/25/2009  . HLD (hyperlipidemia) 12/25/2009  . Type 2 diabetes with nephropathy ([place]) 12/25/2009    CMP     Component Value Date/Time   NA 142 06/30/2019 1105   K 3.2 (L) 06/30/2019 1105   CL 89 (L) 06/30/2019 1105   CO2 36 (H) 06/30/2019 1105   GLUCOSE 110 (H) 06/30/2019 1105   BUN 105 (H) 06/30/2019 1105   CREATININE 3.43 (H) 06/30/2019 1105   CREATININE 1.68 (H) 10/10/2017 1050   CALCIUM 7.8 (L) 06/30/2019 1105   PROT 7.6 05/31/2019 1658   ALBUMIN 3.0 (L) 06/05/2019 0446   AST 14 (L) 05/31/2019 1658   ALT 10 05/31/2019 1658   ALKPHOS 71 05/31/2019 1658   BILITOT 1.0 05/31/2019 1658   GFRNONAA 12 (L) 06/30/2019 1105   GFRNONAA 30 (L) 10/10/2017 1050   GFRAA 14 (L) 06/30/2019 1105   GFRAA 35 (L) 10/10/2017 1050   Recent Labs    06/04/19 0501 06/05/19 0446  06/17/19 0916 06/18/19 0505  06/20/19 0641 06/28/19 0830 06/30/19 1105  NA 138  143 143   < > 141 142   < > 140 140 142  K 4.5  4.6 3.9   < > 4.1 3.9   < > 4.0 3.7 3.2*  CL 100  104 102   < > 96* 97*   < > 95* 88* 89*  CO2 27  26 30    < > 31 33*   < > 33* 35* 36*  GLUCOSE 172*  168* 107*   < > 122* 118*   < > 103* 34* 110*  BUN 47*  47* 55*   < > 65* 65*   < > 62* 85* 105*  CREATININE 2.36*  2.36* 2.74*   < > 2.63* 2.60*   < > 2.24* 3.42* 3.43*  CALCIUM 7.7*  7.9* 7.6*   < > 8.1* 7.9*   < > 8.1* 8.0* 7.8*  MG  --   --   --  1.7 1.5*  --   --   --   --    PHOS 6.5* 6.1*  --   --   --   --   --   --   --    < > = values  in this interval not displayed.   Recent Labs    05/31/19 1658 06/04/19 0501 06/05/19 0446  AST 14*  --   --   ALT 10  --   --   ALKPHOS 71  --   --   BILITOT 1.0  --   --   PROT 7.6  --   --   ALBUMIN 3.8 3.0* 3.0*   Recent Labs    07/12/18 1335 11/22/18 2111  06/16/19 0730 06/28/19 0830 06/30/19 1105  WBC 7.6 8.2   < > 12.7* 11.9* 13.6*  NEUTROABS 5.3 5.9  --   --   --   --   HGB 10.9* 10.8*   < > 10.6* 10.0* 9.0*  HCT 35.7* 35.1*   < > 35.6* 33.1* 29.2*  MCV 92.2 86.2   < > 90.1 89.0 89.0  PLT 255 304   < > 231 239 275   < > = values in this interval not displayed.   No results for input(s): CHOL, LDLCALC, TRIG in the last 8760 hours.  Invalid input(s): HCL No results found for: MICROALBUR No results found for: TSH Lab Results  Component Value Date   HGBA1C 6.7 (H) 07/13/2018   Lab Results  Component Value Date   CHOL 257 (H) 10/10/2017   HDL 38 (L) 10/10/2017   LDLCALC 186 (H) 10/10/2017   TRIG 177 (H) 10/10/2017   CHOLHDL 6.8 (H) 10/10/2017    Significant Diagnostic Results in last 30 days:  Dg Chest Portable 1 View  Result Date: 06/15/2019 CLINICAL DATA:  Dyspnea EXAM: PORTABLE CHEST 1 VIEW COMPARISON:  05/31/2019, 05/08/2016, 07/12/2018 FINDINGS: Right-sided central venous port tip over the SVC. Retrocardiac opacity and lucency compatible with large hiatal hernia. Scarring at the bases. Stable cardiomediastinal silhouette. IMPRESSION: No active disease. Similar appearance of large hiatal hernia, mild cardiomegaly, and basilar scarring Electronically Signed   By: Donavan Foil M.D.   On: 06/15/2019 14:25    Assessment and Plan  Edema with weight gain-today it was noted that patient had 1+ pitting edema she admits to increasing shortness of breath.  Patient has rales throughout.  Patient's weight is 208.3 pounds which is up from prior.  Patient had a lab returned today which showed a  creatinine of 3.43, which is why her Demadex 30 mg twice daily is probably not working.  Patient is at the point where not increasing her Demadex will cause her to retain more fluid and increasing her Demadex will hurt her kidneys.  I decided to double her Demadex for 5days to 100 mg every morning and 30 mg q. afternoon of Demadex on Thursday Friday Saturday and Sunday and an extra 30 mg of Demadex today.  Patient's potassium was low so she will be getting 60 mEq potassium today and then 20 mg daily, keeping in mind patient has elevated creatinine.  No part of the situation is particularly desirable.  Nurses are to call me on Monday with new labs and new vital signs and exams.   Time spent greater than 35 minutes;> 50% of time with patient was spent reviewing records, labs, tests and studies, counseling and developing plan of care  Hennie Duos, MD

## 2019-07-03 ENCOUNTER — Telehealth: Payer: Self-pay | Admitting: Nurse Practitioner

## 2019-07-03 NOTE — Telephone Encounter (Signed)
While on call  On 07/02/19 nursing called to state a preliminary culture report showed gram positive cocci in aerobic only. Otherwise no growth on culture. Reports pt VSS and when asked why the cultures were obtained they reported "she was not eating good and acted like she was not feeling well" Nursing reports no fevers within 24 hours. And she was alert and eating "some better."  Instructed to await final report but call if pt had any changes. Nursing in agreement.

## 2019-07-04 ENCOUNTER — Encounter (HOSPITAL_COMMUNITY): Payer: Self-pay | Admitting: Emergency Medicine

## 2019-07-04 ENCOUNTER — Telehealth: Payer: Self-pay | Admitting: Adult Health

## 2019-07-04 ENCOUNTER — Emergency Department (HOSPITAL_COMMUNITY)
Admission: EM | Admit: 2019-07-04 | Discharge: 2019-07-04 | Disposition: A | Discharge status: Skilled Nursing Facility | Payer: Medicare Other | Source: Home / Self Care | Attending: Emergency Medicine | Admitting: Emergency Medicine

## 2019-07-04 ENCOUNTER — Emergency Department (HOSPITAL_COMMUNITY): Payer: Medicare Other

## 2019-07-04 DIAGNOSIS — N179 Acute kidney failure, unspecified: Secondary | ICD-10-CM | POA: Diagnosis not present

## 2019-07-04 DIAGNOSIS — I5032 Chronic diastolic (congestive) heart failure: Secondary | ICD-10-CM | POA: Insufficient documentation

## 2019-07-04 DIAGNOSIS — Z7982 Long term (current) use of aspirin: Secondary | ICD-10-CM | POA: Insufficient documentation

## 2019-07-04 DIAGNOSIS — N185 Chronic kidney disease, stage 5: Secondary | ICD-10-CM

## 2019-07-04 DIAGNOSIS — Z794 Long term (current) use of insulin: Secondary | ICD-10-CM | POA: Insufficient documentation

## 2019-07-04 DIAGNOSIS — Z87891 Personal history of nicotine dependence: Secondary | ICD-10-CM | POA: Insufficient documentation

## 2019-07-04 DIAGNOSIS — Z20828 Contact with and (suspected) exposure to other viral communicable diseases: Secondary | ICD-10-CM | POA: Insufficient documentation

## 2019-07-04 DIAGNOSIS — R4182 Altered mental status, unspecified: Secondary | ICD-10-CM | POA: Insufficient documentation

## 2019-07-04 DIAGNOSIS — I132 Hypertensive heart and chronic kidney disease with heart failure and with stage 5 chronic kidney disease, or end stage renal disease: Secondary | ICD-10-CM | POA: Insufficient documentation

## 2019-07-04 DIAGNOSIS — Z79899 Other long term (current) drug therapy: Secondary | ICD-10-CM | POA: Insufficient documentation

## 2019-07-04 DIAGNOSIS — E1140 Type 2 diabetes mellitus with diabetic neuropathy, unspecified: Secondary | ICD-10-CM | POA: Insufficient documentation

## 2019-07-04 LAB — COMPREHENSIVE METABOLIC PANEL
ALT: 11 U/L (ref 0–44)
AST: 18 U/L (ref 15–41)
Albumin: 2.7 g/dL — ABNORMAL LOW (ref 3.5–5.0)
Alkaline Phosphatase: 70 U/L (ref 38–126)
Anion gap: 14 (ref 5–15)
BUN: 116 mg/dL — ABNORMAL HIGH (ref 8–23)
CO2: 35 mmol/L — ABNORMAL HIGH (ref 22–32)
Calcium: 8.1 mg/dL — ABNORMAL LOW (ref 8.9–10.3)
Chloride: 93 mmol/L — ABNORMAL LOW (ref 98–111)
Creatinine, Ser: 3.31 mg/dL — ABNORMAL HIGH (ref 0.44–1.00)
GFR calc Af Amer: 15 mL/min — ABNORMAL LOW (ref >60)
GFR calc non Af Amer: 13 mL/min — ABNORMAL LOW (ref >60)
Glucose, Bld: 126 mg/dL — ABNORMAL HIGH (ref 70–99)
Potassium: 3.9 mmol/L (ref 3.5–5.1)
Sodium: 142 mmol/L (ref 135–145)
Total Bilirubin: 1.1 mg/dL (ref 0.3–1.2)
Total Protein: 7 g/dL (ref 6.5–8.1)

## 2019-07-04 LAB — URINALYSIS, ROUTINE W REFLEX MICROSCOPIC
Bilirubin Urine: NEGATIVE
Glucose, UA: NEGATIVE mg/dL
Ketones, ur: NEGATIVE mg/dL
Leukocytes,Ua: NEGATIVE
Nitrite: NEGATIVE
Protein, ur: NEGATIVE mg/dL
Specific Gravity, Urine: 1.011 (ref 1.005–1.030)
pH: 5 (ref 5.0–8.0)

## 2019-07-04 LAB — CBC WITH DIFFERENTIAL/PLATELET
Abs Immature Granulocytes: 0.38 10*3/uL — ABNORMAL HIGH (ref 0.00–0.07)
Basophils Absolute: 0.1 10*3/uL (ref 0.0–0.1)
Basophils Relative: 1 %
Eosinophils Absolute: 0.3 10*3/uL (ref 0.0–0.5)
Eosinophils Relative: 3 %
HCT: 33.7 % — ABNORMAL LOW (ref 36.0–46.0)
Hemoglobin: 10 g/dL — ABNORMAL LOW (ref 12.0–15.0)
Immature Granulocytes: 4 %
Lymphocytes Relative: 7 %
Lymphs Abs: 0.7 10*3/uL (ref 0.7–4.0)
MCH: 27.2 pg (ref 26.0–34.0)
MCHC: 29.7 g/dL — ABNORMAL LOW (ref 30.0–36.0)
MCV: 91.6 fL (ref 80.0–100.0)
Monocytes Absolute: 0.6 10*3/uL (ref 0.1–1.0)
Monocytes Relative: 6 %
Neutro Abs: 7.9 10*3/uL — ABNORMAL HIGH (ref 1.7–7.7)
Neutrophils Relative %: 79 %
Platelets: 355 10*3/uL (ref 150–400)
RBC: 3.68 MIL/uL — ABNORMAL LOW (ref 3.87–5.11)
RDW: 14.9 % (ref 11.5–15.5)
WBC: 9.9 10*3/uL (ref 4.0–10.5)
nRBC: 0 % (ref 0.0–0.2)

## 2019-07-04 LAB — CULTURE, BLOOD (ROUTINE X 2): Special Requests: ADEQUATE

## 2019-07-04 LAB — TROPONIN I (HIGH SENSITIVITY): Troponin I (High Sensitivity): 18 ng/L — ABNORMAL HIGH (ref <18)

## 2019-07-04 LAB — LACTIC ACID, PLASMA: Lactic Acid, Venous: 0.6 mmol/L (ref 0.5–1.9)

## 2019-07-04 MED ORDER — SODIUM CHLORIDE 0.9 % IV BOLUS
1000.0000 mL | Freq: Once | INTRAVENOUS | Status: AC
  Administered 2019-07-04: 1000 mL via INTRAVENOUS

## 2019-07-04 NOTE — ED Triage Notes (Signed)
Pt brought in from [facility] center for evaluation of altered mental status. Pt had gram positive cocci in aerobic blood culture drawn a few days ago. Pt normally alert and oriented and conversive.

## 2019-07-04 NOTE — ED Provider Notes (Signed)
[facility] EMERGENCY DEPARTMENT Provider Note   CSN: [id number] Arrival date & time: 07/04/19  1043     History   Chief Complaint Chief Complaint  Patient presents with  . Altered Mental Status    HPI [name] is a 73 y.o. female.     Pt presents to the ED today from the [facility] for a positive blood culture.  The pt was admitted from 11/10-16 for CHF.  She was d/c to the [facility].  The nursing staff did not feel like she was acting her usual self and had not been eating as well as usual.  She had 1 gram + culture from pt's port that was gram + cocci.  The pt has been afebrile.  Vitals are good.  She is a poor historian.  She is not talking much and will only answer questions periodically.       Past Medical History:  Diagnosis Date  . Blood transfusion without reported diagnosis   . CAD (coronary artery disease)    Nonobstructive at cardiac catheterization 2000  . Cataract   . Cervical cancer ([place]) 1978  . CHF (congestive heart failure) ([place])   . Chronic back pain   . Chronic kidney disease   . COPD (chronic obstructive pulmonary disease) ([place])   . Degenerative disc disease   . Essential hypertension, benign   . GERD (gastroesophageal reflux disease)   . Gout   . Hiatal hernia 07/27/2013  . History of diverticulitis of colon   . History of hiatal hernia   . Iron deficiency anemia   . Irritable bowel syndrome   . Lumbar radiculopathy   . Mixed hyperlipidemia   . Neuropathy   . Osteoporosis   . Ovarian cancer [facility]) 1978   patient denies. States this was cervical cancer  . Oxygen deficiency   . Sleep apnea   . Type 2 diabetes mellitus ([place])   . Vitamin B deficiency 12/25/2009  . Vitamin B12 deficiency     Patient Active Problem List   Diagnosis Date Noted  . Weight gain with edema 07/02/2019  . 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 CKD  stage 3 due to type 2 diabetes mellitus ([place]) 06/22/2019  . Neuropathy due to type 2 diabetes mellitus ([place]) 06/22/2019  . Dyslipidemia associated with type 2 diabetes mellitus ([place]) 06/22/2019  . Chronic gout 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 06/22/2019  . Quality of life palliative care encounter 06/19/2019  . Atrial fibrillation with RVR ([place])   . Atrial flutter with rapid ventricular response ([place]) 06/16/2019  . Dyspnea and respiratory abnormalities 06/01/2019  . Acute on chronic respiratory failure with hypoxia and hypercapnia with respiratory acidosis 06/01/2019  . Anasarca 05/31/2019  . Nausea without vomiting 05/31/2019  . Generalized abdominal pain 05/31/2019  . History of colonic polyps 05/31/2019  . Lymphedema of both lower extremities 04/28/2019  . Chronic respiratory failure with hypoxia ([place]) 07/13/2018  . Acute renal failure superimposed on stage 3 chronic kidney disease ([place]) 07/13/2018  . Chronic diastolic CHF (congestive heart failure) ([place]) 07/13/2018  . COPD (chronic obstructive pulmonary disease) ([place]) 07/13/2018  . COPD exacerbation ([place]) 07/12/2018  . Flatulence 09/18/2017  . IBS (irritable bowel syndrome) 04/03/2017  . Gout 03/15/2017  . Acute on chronic diastolic CHF (congestive heart failure) ([place]) 05/08/2016  . CKD (chronic kidney disease) stage 3, GFR 30-59 ml/min 05/08/2016  . COPD with  acute exacerbation ([place]) 05/08/2016  . Constipation 04/25/2016  . Peripheral vertigo 06/27/2015  . Port-A-Cath in place 10/06/2014  . Chronic obstructive pulmonary disease ([place]) 04/30/2014  . Obesity, Class III, BMI 40-49.9 (morbid obesity) ([place]) 04/30/2014  . Hemorrhoids, internal, with bleeding 11/10/2013  . Anemia 08/18/2013  . Hiatal hernia with GERD and esophagitis   . Obstructive chronic bronchitis without exacerbation ([place]) 12/26/2009  . Dysphagia 12/26/2009  . DM type 2 with diabetic peripheral neuropathy ([place]) 12/25/2009  . Vitamin B deficiency 12/25/2009   . Iron deficiency anemia due to chronic blood loss 12/25/2009  . Benign essential HTN 12/25/2009  . DEGENERATIVE DISC DISEASE 12/25/2009  . OSA on CPAP 12/25/2009  . HLD (hyperlipidemia) 12/25/2009  . Type 2 diabetes with nephropathy ([place]) 12/25/2009    Past Surgical History:  Procedure Laterality Date  . ABDOMINAL HYSTERECTOMY    . BACK SURGERY    . Benign breast cysts    . CHOLECYSTECTOMY    . COLONOSCOPY  10/01/2006   SLF:Pan colonic diverticulosis and moderate internal hemorrhoids/ Otherwise no polyps, masses, inflammatory changes or AVMs/  . COLONOSCOPY  2011   SLF: pancolonic diverticulosis, large internal hemorrhoids  . COLONOSCOPY N/A 01/26/2016   Procedure: COLONOSCOPY;  Surgeon: [name], MD;  Location: [facility] SUITE;  Service: Endoscopy;  Laterality: N/A;  830   . ESOPHAGOGASTRODUODENOSCOPY  11/19/2006   SLF: Large hiatal hernia without evidence of [place] ulcers/. Distal esophageal stricture, which allowed the gastroscope to pass without resistance.  A 16 mm Savary later passed with mild resistance/ Normal stomach.sb bx negative  . ESOPHAGOGASTRODUODENOSCOPY  10/01/2006   HXT:AVWPV hiatal hernia.  Distal esophagus without evidence of   erythema, ulceration or Barrett's esophagus  . ESOPHAGOGASTRODUODENOSCOPY  2011   SLF: large hh, distal esophageal web narrowing to 60mm s/p dilation to 105mm  . ESOPHAGOGASTRODUODENOSCOPY N/A 08/06/2013   SLF: 1. Stricture at the gastroesophageal junction 2. large hiatal hernia. 3. Mild erosive gastritis.  Marland Kitchen GIVENS CAPSULE STUDY N/A 08/06/2013   INCOMPLETE-SMALL BOWLE ULCERS  . KNEE SURGERY Right   . PARTIAL HYSTERECTOMY  1978  . small bowel capsule  2008   negative  . SPINE SURGERY    . TONSILLECTOMY AND ADENOIDECTOMY    . Two back surgeries/fusion    . UMBILICAL HERNIA REPAIR  2010     OB History   No obstetric history on file.      Home Medications    Prior to Admission medications   Medication Sig Start Date End Date  Taking? Authorizing Provider  albuterol (PROVENTIL) (2.5 MG/3ML) 0.083% nebulizer solution Take 2.5 mg by nebulization every 6 (six) hours as needed for wheezing or shortness of breath.  04/09/19  Yes [provider]  albuterol (VENTOLIN HFA) 108 (90 Base) MCG/ACT inhaler Inhale 2 puffs into the lungs every 6 (six) hours as needed for wheezing or shortness of breath.  04/09/19  Yes [provider]  allopurinol (ZYLOPRIM) 100 MG tablet Take 100 mg by mouth 2 (two) times daily.  10/09/12  Yes [provider]  amitriptyline (ELAVIL) 100 MG tablet Take 100 mg by mouth at bedtime.     Yes [provider]  apixaban (ELIQUIS) 5 MG TABS tablet Take 1 tablet (5 mg total) by mouth 2 (two) times daily. 06/21/19  Yes Johnson, Clanford L, MD  aspirin EC 81 MG tablet Take 81 mg by mouth daily.   Yes [provider]  atorvastatin (LIPITOR) 40 MG tablet Take 1 tablet (40 mg  total) by mouth at bedtime. 05/04/18  Yes Satira Sark, MD  BIOTIN PO Take 1 capsule by mouth daily.   Yes [provider]  diltiazem (CARDIZEM CD) 120 MG 24 hr capsule Take 1 capsule (120 mg total) by mouth daily. 06/22/19  Yes Johnson, Clanford L, MD  esomeprazole (NEXIUM) 40 MG capsule TAKE 1 CAPSULE TWICE A DAY 30 MINUTES PRIOR TO MEALS 01/19/18  Yes Annitta Needs, NP  famotidine (PEPCID) 40 MG tablet TAKE 1 TABLET AT BEDTIME AS NEEDED TO CONTROL REFLUX 06/23/15  Yes Mannam, Praveen, MD  feeding supplement, GLUCERNA SHAKE, (GLUCERNA SHAKE) LIQD Take 237 mLs by mouth 2 (two) times daily between meals.   Yes [provider]  guaiFENesin (MUCINEX) 600 MG 12 hr tablet Take 1 tablet (600 mg total) by mouth 2 (two) times daily. 06/08/19  Yes Barton Dubois, MD  hydrALAZINE (APRESOLINE) 25 MG tablet Take 25 mg by mouth 2 (two) times daily.   Yes [provider]  insulin lispro (HUMALOG KWIKPEN) 100 UNIT/ML KwikPen Inject 5 Units into the skin 3 (three) times daily.   Yes [provider]  LANTUS SOLOSTAR 100 UNIT/ML SOPN Inject 8 Units into the skin at bedtime.  10/06/12  Yes [provider]  loperamide (IMODIUM) 2 MG capsule Take 2 mg by mouth every 6 (six) hours as needed for diarrhea or loose stools.    Yes [provider]  metolazone (ZAROXOLYN) 2.5 MG tablet Take 2.5 mg by mouth. On Monday,Wednesday,Friday   Yes [provider]  metoprolol succinate (TOPROL-XL) 25 MG 24 hr tablet Take 25 mg by mouth daily. Along with 50 mg to = 75 mg   Yes [provider]  metoprolol succinate (TOPROL-XL) 50 MG 24 hr tablet Take 50 mg by mouth daily. Along with 25 mg to = 75 mg   Yes [provider]  mirtazapine (REMERON) 7.5 MG tablet Take 7.5 mg by mouth at bedtime.   Yes [provider]  OXYGEN Inhale 2 L into the lungs. Every Shift   Yes [provider]  potassium chloride SA (KLOR-CON) 20 MEQ tablet Take 20 mEq by mouth daily.   Yes [provider]  torsemide (DEMADEX) 10 MG tablet Take 30 mg by mouth daily. 07/01/19  Yes [provider]  torsemide (DEMADEX) 100 MG tablet Take 100 mg by mouth daily. 07/01/19  Yes [provider]  TRELEGY ELLIPTA 100-62.5-25 MCG/INH AEPB Inhale 1 puff into the lungs daily. 05/13/19  Yes [provider]    Family History Family History  Problem Relation Age of Onset  . Colon cancer Brother        diagnosed age 59. Living.   Marland Kitchen Ulcers Sister   . Diabetes Sister   . Heart attack Sister   . Kidney failure Sister   . Stroke Sister   . Ulcers Mother   . Diabetes Mother   . Heart attack Mother   . Stroke Mother   . Asthma Mother   . Heart disease Mother   . Cervical cancer Mother   . Heart attack Brother   . Heart disease Brother   . Asthma Sister   . Diabetes Brother   . Stroke Maternal Grandmother   . Heart attack Maternal Grandmother   . Heart attack Other   . Early death Father        MVA in his 1s    Social History Social  History   Tobacco Use  . Smoking status:  Former Smoker    Packs/day: 1.00    Years: 1.00    Pack years: 1.00    Types: Cigarettes    Start date: 02/19/1961    Quit date: 08/05/1961    Years since quitting: 57.9  . Smokeless tobacco: Never Used  . Tobacco comment: Quit smoking x 50 years  Substance Use Topics  . Alcohol use: No  . Drug use: No     Allergies   Patient has no known allergies.   Review of Systems Review of Systems  Unable to perform ROS: Mental status change     Physical Exam Updated Vital Signs BP (!) 145/59   Pulse 87   Temp 98.2 F (36.8 C) (Oral)   Resp 13   Ht 5\' 4"  (1.626 m)   Wt 94.5 kg   SpO2 100%   BMI 35.76 kg/m   Physical Exam Vitals signs and nursing note reviewed.  Constitutional:      Appearance: Normal appearance. She is obese.  HENT:     Head: Normocephalic and atraumatic.     Right Ear: External ear normal.     Left Ear: External ear normal.     Nose: Nose normal.     Mouth/Throat:     Mouth: Mucous membranes are moist.     Pharynx: Oropharynx is clear.  Eyes:     Extraocular Movements: Extraocular movements intact.     Conjunctiva/sclera: Conjunctivae normal.     Pupils: Pupils are equal, round, and reactive to light.  Neck:     Musculoskeletal: Normal range of motion and neck supple.  Cardiovascular:     Rate and Rhythm: Normal rate and regular rhythm.     Pulses: Normal pulses.     Heart sounds: Normal heart sounds.  Pulmonary:     Effort: Pulmonary effort is normal.     Breath sounds: Normal breath sounds.  Abdominal:     General: Abdomen is flat. Bowel sounds are normal.     Palpations: Abdomen is soft.  Musculoskeletal: Normal range of motion.  Skin:    General: Skin is warm.     Comments: Both lower legs slightly red with mild edema.  Neurological:     Mental Status: She is alert.     Comments: Pt only speaking occasionally.  She is not reliably following commands.      ED Treatments / Results  Labs  (all labs ordered are listed, but only abnormal results are displayed) Labs Reviewed  COMPREHENSIVE METABOLIC PANEL - Abnormal; Notable for the following components:      Result Value   Chloride 93 (*)    CO2 35 (*)    Glucose, Bld 126 (*)    BUN 116 (*)    Creatinine, Ser 3.31 (*)    Calcium 8.1 (*)    Albumin 2.7 (*)    GFR calc non Af Amer 13 (*)    GFR calc Af Amer 15 (*)    All other components within normal limits  CBC WITH DIFFERENTIAL/PLATELET - Abnormal; Notable for the following components:   RBC 3.68 (*)    Hemoglobin 10.0 (*)    HCT 33.7 (*)    MCHC 29.7 (*)    Neutro Abs 7.9 (*)    Abs Immature Granulocytes 0.38 (*)    All other components within normal limits  URINALYSIS, ROUTINE W REFLEX MICROSCOPIC - Abnormal; Notable for the following components:   Hgb urine dipstick SMALL (*)    Bacteria, UA FEW (*)  All other components within normal limits  TROPONIN I (HIGH SENSITIVITY) - Abnormal; Notable for the following components:   Troponin I (High Sensitivity) 18 (*)    All other components within normal limits  CULTURE, BLOOD (ROUTINE X 2)  CULTURE, BLOOD (ROUTINE X 2)  URINE CULTURE  SARS CORONAVIRUS 2 (TAT 6-24 HRS)  LACTIC ACID, PLASMA  LACTIC ACID, PLASMA  TROPONIN I (HIGH SENSITIVITY)    EKG EKG Interpretation  Date/Time:  Sunday July 04 2019 10:53:36 EST Ventricular Rate:  88 PR Interval:    QRS Duration: 119 QT Interval:  386 QTC Calculation: 467 R Axis:   43 Text Interpretation: Sinus rhythm LVH with IVCD and secondary repol abnrm ST depr, consider ischemia, inferior leads Baseline wander in lead(s) I III t wave inversions diffusely Confirmed by Isla Pence [id number]) on 07/04/2019 11:23:22 AM   Radiology Dg Chest Portable 1 View  Result Date: 07/04/2019 CLINICAL DATA:  Altered mental status. Recent positive blood culture EXAM: PORTABLE CHEST 1 VIEW COMPARISON:  June 15, 2019 FINDINGS: Port-A-Cath tip is in the superior vena cava.  No pneumothorax. There is no appreciable edema or consolidation. There is slight atelectatic change in the left lower lobe. Heart is upper normal in size with pulmonary vascularity normal. No adenopathy. 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 No bone lesions. IMPRESSION: Large paraesophageal hernia. Mild left base atelectasis. No edema or consolidation. Stable cardiac silhouette. Port-A-Cath tip in superior vena cava. Electronically Signed   By: Lowella Grip III M.D.   On: 07/04/2019 11:26    Procedures Procedures (including critical care time)  Medications Ordered in ED Medications  sodium chloride 0.9 % bolus 1,000 mL (0 mLs Intravenous Stopped 07/04/19 1227)     Initial Impression / Assessment and Plan / ED Course  I have reviewed the triage vital signs and the nursing notes.  Pertinent labs & imaging results that were available during my care of the patient were reviewed by me and considered in my medical decision making (see chart for details).    Pt is awake and alert.  Labs wnl.  No hx of fever at the SNF or here.  I think that positive blood culture was a contaminate.  Blood cultures have been repeated, but she is stable for d/c back to the SNF.  Final Clinical Impressions(s) / ED Diagnoses   Final diagnoses:  Altered mental status, unspecified altered mental status type  Stage 5 chronic kidney disease not on chronic dialysis [facility])    ED Discharge Orders    None       Isla Pence, MD 07/04/19 [phone]

## 2019-07-05 ENCOUNTER — Inpatient Hospital Stay (HOSPITAL_COMMUNITY)
Admission: EM | Admit: 2019-07-05 | Discharge: 2019-07-08 | DRG: 682 | Disposition: A | Discharge status: Skilled Nursing Facility | Payer: Medicare Other | Attending: Family Medicine | Admitting: Family Medicine

## 2019-07-05 ENCOUNTER — Other Ambulatory Visit: Payer: Self-pay

## 2019-07-05 ENCOUNTER — Emergency Department (HOSPITAL_COMMUNITY): Payer: Medicare Other

## 2019-07-05 ENCOUNTER — Encounter (HOSPITAL_COMMUNITY): Payer: Self-pay | Admitting: Emergency Medicine

## 2019-07-05 DIAGNOSIS — I130 Hypertensive heart and chronic kidney disease with heart failure and stage 1 through stage 4 chronic kidney disease, or unspecified chronic kidney disease: Secondary | ICD-10-CM | POA: Diagnosis present

## 2019-07-05 DIAGNOSIS — E669 Obesity, unspecified: Secondary | ICD-10-CM | POA: Diagnosis present

## 2019-07-05 DIAGNOSIS — E1122 Type 2 diabetes mellitus with diabetic chronic kidney disease: Secondary | ICD-10-CM | POA: Diagnosis present

## 2019-07-05 DIAGNOSIS — Z8049 Family history of malignant neoplasm of other genital organs: Secondary | ICD-10-CM

## 2019-07-05 DIAGNOSIS — N179 Acute kidney failure, unspecified: Principal | ICD-10-CM | POA: Diagnosis present

## 2019-07-05 DIAGNOSIS — Z6835 Body mass index (BMI) 35.0-35.9, adult: Secondary | ICD-10-CM

## 2019-07-05 DIAGNOSIS — J449 Chronic obstructive pulmonary disease, unspecified: Secondary | ICD-10-CM | POA: Diagnosis present

## 2019-07-05 DIAGNOSIS — E782 Mixed hyperlipidemia: Secondary | ICD-10-CM | POA: Diagnosis present

## 2019-07-05 DIAGNOSIS — Z7901 Long term (current) use of anticoagulants: Secondary | ICD-10-CM

## 2019-07-05 DIAGNOSIS — R0902 Hypoxemia: Secondary | ICD-10-CM | POA: Diagnosis present

## 2019-07-05 DIAGNOSIS — N390 Urinary tract infection, site not specified: Secondary | ICD-10-CM | POA: Diagnosis present

## 2019-07-05 DIAGNOSIS — M810 Age-related osteoporosis without current pathological fracture: Secondary | ICD-10-CM | POA: Diagnosis present

## 2019-07-05 DIAGNOSIS — Z800 Family history of malignant neoplasm of digestive organs: Secondary | ICD-10-CM

## 2019-07-05 DIAGNOSIS — R778 Other specified abnormalities of plasma proteins: Secondary | ICD-10-CM

## 2019-07-05 DIAGNOSIS — Z79899 Other long term (current) drug therapy: Secondary | ICD-10-CM

## 2019-07-05 DIAGNOSIS — Z841 Family history of disorders of kidney and ureter: Secondary | ICD-10-CM

## 2019-07-05 DIAGNOSIS — E1142 Type 2 diabetes mellitus with diabetic polyneuropathy: Secondary | ICD-10-CM | POA: Diagnosis present

## 2019-07-05 DIAGNOSIS — M1030 Gout due to renal impairment, unspecified site: Secondary | ICD-10-CM | POA: Diagnosis present

## 2019-07-05 DIAGNOSIS — Z87891 Personal history of nicotine dependence: Secondary | ICD-10-CM

## 2019-07-05 DIAGNOSIS — I890 Lymphedema, not elsewhere classified: Secondary | ICD-10-CM | POA: Diagnosis present

## 2019-07-05 DIAGNOSIS — K449 Diaphragmatic hernia without obstruction or gangrene: Secondary | ICD-10-CM | POA: Diagnosis present

## 2019-07-05 DIAGNOSIS — Z20828 Contact with and (suspected) exposure to other viral communicable diseases: Secondary | ICD-10-CM | POA: Diagnosis present

## 2019-07-05 DIAGNOSIS — I5031 Acute diastolic (congestive) heart failure: Secondary | ICD-10-CM | POA: Diagnosis present

## 2019-07-05 DIAGNOSIS — H81399 Other peripheral vertigo, unspecified ear: Secondary | ICD-10-CM | POA: Diagnosis present

## 2019-07-05 DIAGNOSIS — G4733 Obstructive sleep apnea (adult) (pediatric): Secondary | ICD-10-CM | POA: Diagnosis present

## 2019-07-05 DIAGNOSIS — Z8249 Family history of ischemic heart disease and other diseases of the circulatory system: Secondary | ICD-10-CM

## 2019-07-05 DIAGNOSIS — K2100 Gastro-esophageal reflux disease with esophagitis, without bleeding: Secondary | ICD-10-CM | POA: Diagnosis present

## 2019-07-05 DIAGNOSIS — M1A30X0 Chronic gout due to renal impairment, unspecified site, without tophus (tophi): Secondary | ICD-10-CM | POA: Diagnosis present

## 2019-07-05 DIAGNOSIS — Z7982 Long term (current) use of aspirin: Secondary | ICD-10-CM

## 2019-07-05 DIAGNOSIS — I2510 Atherosclerotic heart disease of native coronary artery without angina pectoris: Secondary | ICD-10-CM | POA: Diagnosis present

## 2019-07-05 DIAGNOSIS — J441 Chronic obstructive pulmonary disease with (acute) exacerbation: Secondary | ICD-10-CM | POA: Diagnosis present

## 2019-07-05 DIAGNOSIS — Z823 Family history of stroke: Secondary | ICD-10-CM

## 2019-07-05 DIAGNOSIS — K5730 Diverticulosis of large intestine without perforation or abscess without bleeding: Secondary | ICD-10-CM | POA: Diagnosis present

## 2019-07-05 DIAGNOSIS — Z90711 Acquired absence of uterus with remaining cervical stump: Secondary | ICD-10-CM

## 2019-07-05 DIAGNOSIS — B9689 Other specified bacterial agents as the cause of diseases classified elsewhere: Secondary | ICD-10-CM | POA: Diagnosis present

## 2019-07-05 DIAGNOSIS — G9341 Metabolic encephalopathy: Secondary | ICD-10-CM | POA: Diagnosis present

## 2019-07-05 DIAGNOSIS — Z833 Family history of diabetes mellitus: Secondary | ICD-10-CM

## 2019-07-05 DIAGNOSIS — I480 Paroxysmal atrial fibrillation: Secondary | ICD-10-CM | POA: Diagnosis present

## 2019-07-05 DIAGNOSIS — R339 Retention of urine, unspecified: Secondary | ICD-10-CM | POA: Diagnosis present

## 2019-07-05 DIAGNOSIS — Z8541 Personal history of malignant neoplasm of cervix uteri: Secondary | ICD-10-CM

## 2019-07-05 DIAGNOSIS — K219 Gastro-esophageal reflux disease without esophagitis: Secondary | ICD-10-CM | POA: Diagnosis present

## 2019-07-05 DIAGNOSIS — Z825 Family history of asthma and other chronic lower respiratory diseases: Secondary | ICD-10-CM

## 2019-07-05 DIAGNOSIS — E869 Volume depletion, unspecified: Secondary | ICD-10-CM | POA: Diagnosis present

## 2019-07-05 DIAGNOSIS — N1832 Chronic kidney disease, stage 3b: Secondary | ICD-10-CM | POA: Diagnosis present

## 2019-07-05 LAB — TROPONIN I (HIGH SENSITIVITY)
Troponin I (High Sensitivity): 29 ng/L — ABNORMAL HIGH (ref <18)
Troponin I (High Sensitivity): 44 ng/L — ABNORMAL HIGH (ref <18)
Troponin I (High Sensitivity): 53 ng/L — ABNORMAL HIGH (ref <18)
Troponin I (High Sensitivity): 52 ng/L — ABNORMAL HIGH (ref <18)
Troponin I (High Sensitivity): 50 ng/L — ABNORMAL HIGH (ref <18)
Troponin I (High Sensitivity): 51 ng/L — ABNORMAL HIGH (ref <18)

## 2019-07-05 LAB — CBC WITH DIFFERENTIAL/PLATELET
Abs Immature Granulocytes: 0.48 10*3/uL — ABNORMAL HIGH (ref 0.00–0.07)
Basophils Absolute: 0.1 10*3/uL (ref 0.0–0.1)
Basophils Relative: 1 %
Eosinophils Absolute: 0.2 10*3/uL (ref 0.0–0.5)
Eosinophils Relative: 2 %
HCT: 29.9 % — ABNORMAL LOW (ref 36.0–46.0)
Hemoglobin: 8.9 g/dL — ABNORMAL LOW (ref 12.0–15.0)
Immature Granulocytes: 5 %
Lymphocytes Relative: 10 %
Lymphs Abs: 1 10*3/uL (ref 0.7–4.0)
MCH: 26.9 pg (ref 26.0–34.0)
MCHC: 29.8 g/dL — ABNORMAL LOW (ref 30.0–36.0)
MCV: 90.3 fL (ref 80.0–100.0)
Monocytes Absolute: 0.5 10*3/uL (ref 0.1–1.0)
Monocytes Relative: 5 %
Neutro Abs: 7.1 10*3/uL (ref 1.7–7.7)
Neutrophils Relative %: 77 %
Platelets: 333 10*3/uL (ref 150–400)
RBC: 3.31 MIL/uL — ABNORMAL LOW (ref 3.87–5.11)
RDW: 15.3 % (ref 11.5–15.5)
WBC: 9.3 10*3/uL (ref 4.0–10.5)
nRBC: 0 % (ref 0.0–0.2)

## 2019-07-05 LAB — LACTIC ACID, PLASMA: Lactic Acid, Venous: 0.6 mmol/L (ref 0.5–1.9)

## 2019-07-05 LAB — GLUCOSE, CAPILLARY: Glucose-Capillary: 110 mg/dL — ABNORMAL HIGH (ref 70–99)

## 2019-07-05 LAB — BASIC METABOLIC PANEL
Anion gap: 11 (ref 5–15)
BUN: 116 mg/dL — ABNORMAL HIGH (ref 8–23)
CO2: 34 mmol/L — ABNORMAL HIGH (ref 22–32)
Calcium: 7.6 mg/dL — ABNORMAL LOW (ref 8.9–10.3)
Chloride: 94 mmol/L — ABNORMAL LOW (ref 98–111)
Creatinine, Ser: 4.2 mg/dL — ABNORMAL HIGH (ref 0.44–1.00)
GFR calc Af Amer: 11 mL/min — ABNORMAL LOW (ref >60)
GFR calc non Af Amer: 10 mL/min — ABNORMAL LOW (ref >60)
Glucose, Bld: 145 mg/dL — ABNORMAL HIGH (ref 70–99)
Potassium: 3.7 mmol/L (ref 3.5–5.1)
Sodium: 139 mmol/L (ref 135–145)

## 2019-07-05 LAB — SARS CORONAVIRUS 2 (TAT 6-24 HRS): SARS Coronavirus 2: NEGATIVE

## 2019-07-05 LAB — BRAIN NATRIURETIC PEPTIDE: B Natriuretic Peptide: 321 pg/mL — ABNORMAL HIGH (ref 0.0–100.0)

## 2019-07-05 MED ORDER — POLYETHYLENE GLYCOL 3350 17 G PO PACK: 17.0000 g | PACK | Freq: Every day | ORAL | Status: DC | PRN

## 2019-07-05 MED ORDER — ACETAMINOPHEN 325 MG PO TABS: 650.0000 mg | ORAL_TABLET | Freq: Four times a day (QID) | ORAL | Status: DC | PRN

## 2019-07-05 MED ORDER — METOPROLOL SUCCINATE ER 50 MG PO TB24: 50.0000 mg | ORAL_TABLET | Freq: Every day | ORAL | Status: DC

## 2019-07-05 MED ORDER — GLUCERNA SHAKE PO LIQD: 237.0000 mL | Freq: Two times a day (BID) | ORAL | Status: DC

## 2019-07-05 MED ORDER — ATORVASTATIN CALCIUM 40 MG PO TABS
40.0000 mg | ORAL_TABLET | Freq: Every day | ORAL | Status: DC
  Administered 2019-07-05 – 2019-07-07 (×3): 40 mg via ORAL
  Filled 2019-07-05 (×3): qty 1

## 2019-07-05 MED ORDER — PANTOPRAZOLE SODIUM 40 MG PO TBEC
40.0000 mg | DELAYED_RELEASE_TABLET | Freq: Every day | ORAL | Status: DC
  Administered 2019-07-06 – 2019-07-08 (×3): 40 mg via ORAL
  Filled 2019-07-05 (×3): qty 1

## 2019-07-05 MED ORDER — ALBUTEROL SULFATE (2.5 MG/3ML) 0.083% IN NEBU: 2.5000 mg | INHALATION_SOLUTION | Freq: Four times a day (QID) | RESPIRATORY_TRACT | Status: DC | PRN

## 2019-07-05 MED ORDER — ACETAMINOPHEN 650 MG RE SUPP: 650.0000 mg | Freq: Four times a day (QID) | RECTAL | Status: DC | PRN

## 2019-07-05 MED ORDER — HYDRALAZINE HCL 25 MG PO TABS
25.0000 mg | ORAL_TABLET | Freq: Two times a day (BID) | ORAL | Status: DC
  Administered 2019-07-05 – 2019-07-08 (×6): 25 mg via ORAL
  Filled 2019-07-05 (×6): qty 1

## 2019-07-05 MED ORDER — INSULIN ASPART 100 UNIT/ML ~~LOC~~ SOLN
0.0000 [IU] | Freq: Three times a day (TID) | SUBCUTANEOUS | Status: DC
  Administered 2019-07-07: 17:00:00 1 [IU] via SUBCUTANEOUS

## 2019-07-05 MED ORDER — ALLOPURINOL 100 MG PO TABS
100.0000 mg | ORAL_TABLET | Freq: Two times a day (BID) | ORAL | Status: DC
  Administered 2019-07-05 – 2019-07-08 (×6): 100 mg via ORAL
  Filled 2019-07-05 (×6): qty 1

## 2019-07-05 MED ORDER — APIXABAN 5 MG PO TABS
5.0000 mg | ORAL_TABLET | Freq: Two times a day (BID) | ORAL | Status: DC
  Administered 2019-07-05 – 2019-07-08 (×6): 5 mg via ORAL
  Filled 2019-07-05 (×6): qty 1

## 2019-07-05 MED ORDER — METOPROLOL SUCCINATE ER 25 MG PO TB24
75.0000 mg | ORAL_TABLET | Freq: Every day | ORAL | Status: DC
  Administered 2019-07-06 – 2019-07-08 (×3): 75 mg via ORAL
  Filled 2019-07-05 (×3): qty 3

## 2019-07-05 MED ORDER — FLUTICASONE-UMECLIDIN-VILANT 100-62.5-25 MCG/INH IN AEPB: 1.0000 | INHALATION_SPRAY | Freq: Every day | RESPIRATORY_TRACT | Status: DC

## 2019-07-05 MED ORDER — ASPIRIN EC 81 MG PO TBEC
81.0000 mg | DELAYED_RELEASE_TABLET | Freq: Every day | ORAL | Status: DC
  Administered 2019-07-06 – 2019-07-08 (×3): 81 mg via ORAL
  Filled 2019-07-05 (×3): qty 1

## 2019-07-05 MED ORDER — FLUTICASONE FUROATE-VILANTEROL 100-25 MCG/INH IN AEPB
1.0000 | INHALATION_SPRAY | Freq: Every day | RESPIRATORY_TRACT | Status: DC
  Administered 2019-07-06 – 2019-07-08 (×3): 1 via RESPIRATORY_TRACT
  Filled 2019-07-05: qty 28

## 2019-07-05 MED ORDER — CHLORHEXIDINE GLUCONATE CLOTH 2 % EX PADS
6.0000 | MEDICATED_PAD | Freq: Every day | CUTANEOUS | Status: DC
  Administered 2019-07-07 – 2019-07-08 (×2): 6 via TOPICAL

## 2019-07-05 MED ORDER — UMECLIDINIUM BROMIDE 62.5 MCG/INH IN AEPB
1.0000 | INHALATION_SPRAY | Freq: Every day | RESPIRATORY_TRACT | Status: DC
  Administered 2019-07-06 – 2019-07-08 (×3): 1 via RESPIRATORY_TRACT
  Filled 2019-07-05: qty 7

## 2019-07-05 MED ORDER — ONDANSETRON HCL 4 MG/2ML IJ SOLN: 4.0000 mg | Freq: Four times a day (QID) | INTRAMUSCULAR | Status: DC | PRN

## 2019-07-05 MED ORDER — DILTIAZEM HCL ER COATED BEADS 120 MG PO CP24
120.0000 mg | ORAL_CAPSULE | Freq: Every day | ORAL | Status: DC
  Administered 2019-07-06 – 2019-07-08 (×3): 120 mg via ORAL
  Filled 2019-07-05 (×3): qty 1

## 2019-07-05 MED ORDER — ONDANSETRON HCL 4 MG PO TABS: 4.0000 mg | ORAL_TABLET | Freq: Four times a day (QID) | ORAL | Status: DC | PRN

## 2019-07-05 MED ORDER — INSULIN GLARGINE 100 UNIT/ML ~~LOC~~ SOLN
8.0000 [IU] | Freq: Every day | SUBCUTANEOUS | Status: DC
  Administered 2019-07-05 – 2019-07-07 (×3): 8 [IU] via SUBCUTANEOUS
  Filled 2019-07-05: qty 1
  Filled 2019-07-05 (×4): qty 0.08

## 2019-07-05 MED ORDER — MIRTAZAPINE 15 MG PO TABS
7.5000 mg | ORAL_TABLET | Freq: Every day | ORAL | Status: DC
  Administered 2019-07-05 – 2019-07-07 (×3): 7.5 mg via ORAL
  Filled 2019-07-05 (×3): qty 1

## 2019-07-05 MED ORDER — FUROSEMIDE 10 MG/ML IJ SOLN: 40.0000 mg | Freq: Two times a day (BID) | INTRAMUSCULAR | Status: DC

## 2019-07-05 MED ORDER — FUROSEMIDE 10 MG/ML IJ SOLN
60.0000 mg | Freq: Two times a day (BID) | INTRAMUSCULAR | Status: DC
  Administered 2019-07-05: 60 mg via INTRAVENOUS
  Filled 2019-07-05 (×2): qty 6

## 2019-07-05 MED ORDER — INSULIN ASPART 100 UNIT/ML ~~LOC~~ SOLN: 0.0000 [IU] | Freq: Every day | SUBCUTANEOUS | Status: DC

## 2019-07-05 NOTE — ED Provider Notes (Signed)
[facility] EMERGENCY DEPARTMENT Provider Note   CSN: [id number] Arrival date & time: 07/05/19  0909     History   Chief Complaint Chief Complaint  Patient presents with   Altered Mental Status    HPI [name] is a 73 y.o. female.     Patient sent over from the [facility].  They felt that she was having worsening shortness of breath.  Patient does have oxygen available to use as needed.  It she uses CPAP at night.  Patient was seen yesterday for altered mental status with very extensive work-up to include blood cultures urine cultures lactic acid CBC troponins without any significant findings including a negative chest x-ray.  Patient was discharged back to [facility].  But they returned her here this morning.  Patient very alert and talkative this morning.  Our charge nurse that saw her yesterday says she is much more alert than yesterday.  Patient's past medical history is significant for COPD.  And coronary artery disease.  Nonobstructive cardiac catheterization in 2000.  Also significant for CHF and hypertension.  Patient denies any chest pain.  Says she just feels a little fatigued.  Denies any significant shortness of breath.  Patient has had trouble with volume overload in the past.  But states that the fluid is doing much better.  Patient's Covid testing was negative yesterday 2.  Urinalysis was negative yesterday 2.  Patient did have a positive blood culture 1 gram-positive culture felt to be a contaminant.  That was part of the work-up from yesterday.  That blood culture was on 26 November.     Past Medical History:  Diagnosis Date   Blood transfusion without reported diagnosis    CAD (coronary artery disease)    Nonobstructive at cardiac catheterization 2000   Cataract    Cervical cancer ([place]) 1978   CHF (congestive heart failure) (HCC)    Chronic back pain    Chronic kidney disease    COPD (chronic obstructive pulmonary disease) (HCC)     Degenerative disc disease    Essential hypertension, benign    GERD (gastroesophageal reflux disease)    Gout    Hiatal hernia 07/27/2013   History of diverticulitis of colon    History of hiatal hernia    Iron deficiency anemia    Irritable bowel syndrome    Lumbar radiculopathy    Mixed hyperlipidemia    Neuropathy    Osteoporosis    Ovarian cancer ([place]) 1978   patient denies. 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 ([place]) 06/22/2019   CKD stage 3 due to type 2 diabetes mellitus ([place]) 06/22/2019   Neuropathy due to type 2 diabetes mellitus ([place]) 06/22/2019   Dyslipidemia associated with type 2 diabetes mellitus ([place]) 06/22/2019   Chronic gout 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 06/22/2019   Quality of life palliative care encounter 06/19/2019   Atrial fibrillation with RVR (HCC)    Atrial flutter with rapid ventricular response ([place]) 06/16/2019   Dyspnea and respiratory abnormalities 06/01/2019   Acute on chronic respiratory failure with hypoxia and hypercapnia with respiratory acidosis  06/01/2019   Anasarca 05/31/2019   Nausea without vomiting 05/31/2019   Generalized abdominal pain 05/31/2019   History of colonic polyps 05/31/2019   Lymphedema of both lower extremities 04/28/2019   Chronic respiratory failure with hypoxia ([place]) 07/13/2018   Acute renal failure superimposed on stage 3 chronic kidney disease ([place]) 07/13/2018   Chronic diastolic CHF (congestive heart failure) ([place]) 07/13/2018   COPD (chronic obstructive pulmonary disease) ([place]) 07/13/2018   COPD exacerbation ([place]) 07/12/2018    Flatulence 09/18/2017   IBS (irritable bowel syndrome) 04/03/2017   Gout 03/15/2017   Acute on chronic diastolic CHF (congestive heart failure) ([place]) 05/08/2016   CKD (chronic kidney disease) stage 3, GFR 30-59 ml/min 05/08/2016   COPD with acute exacerbation ([place]) 05/08/2016   Constipation 04/25/2016   Peripheral vertigo 06/27/2015   Port-A-Cath in place 10/06/2014   Chronic obstructive pulmonary disease ([place]) 04/30/2014   Obesity, Class III, BMI 40-49.9 (morbid obesity) ([place]) 04/30/2014   Hemorrhoids, internal, with bleeding 11/10/2013   Anemia 08/18/2013   Hiatal hernia with GERD and esophagitis    Obstructive chronic bronchitis without exacerbation ([place]) 12/26/2009   Dysphagia 12/26/2009   DM type 2 with diabetic peripheral neuropathy ([place]) 12/25/2009   Vitamin B deficiency 12/25/2009   Iron deficiency anemia due to chronic blood loss 12/25/2009   Benign essential HTN 12/25/2009   DEGENERATIVE Lane DISEASE 12/25/2009   OSA on CPAP 12/25/2009   HLD (hyperlipidemia) 12/25/2009   Type 2 diabetes with nephropathy ([place]) 12/25/2009    Past Surgical History:  Procedure Laterality Date   ABDOMINAL HYSTERECTOMY     BACK SURGERY     Benign breast cysts     CHOLECYSTECTOMY     COLONOSCOPY  10/01/2006   SLF:Pan colonic diverticulosis and moderate internal hemorrhoids/ Otherwise no polyps, masses, inflammatory changes or AVMs/   COLONOSCOPY  2011   SLF: pancolonic diverticulosis, large internal hemorrhoids   COLONOSCOPY N/A 01/26/2016   Procedure: COLONOSCOPY;  Surgeon: [name], MD;  Location: [facility] END[facility] SUITE;  Service: Endoscopy;  Laterality: N/A;  830    ESOPHAGOGASTRODUODENOSCOPY  11/19/2006   SLF: Large hiatal hernia without evidence of [place] ulcers/. Distal esophageal stricture, which allowed the gastroscope to pass without resistance.  A 16 mm Sav[place] later passed with mild resistance/ Normal stomach.sb bx negative    ESOPHAGOGASTRODUODENOSCOPY  10/01/2006   GEZ:MOQHU hiatal hernia.  Distal esophagus without evidence of   erythema, ulceration or Barrett's esophagus   ESOPHAGOGASTRODUODENOSCOPY  2011   SLF: large hh, distal esophageal web narrowing to 16mm s/p dilation to 24mm   ESOPHAGOGASTRODUODENOSCOPY N/A 08/06/2013   SLF: 1. Stricture at the gastroesophageal junction 2. large hiatal hernia. 3. Mild erosive gastritis.   GIVENS CAPSULE STUDY N/A 08/06/2013   INCOMPLETE-SMALL BOWLE ULCERS   KNEE SURGERY Right    PARTIAL HYSTERECTOMY  1978   small bowel capsule  2008   negative   SPINE SURGERY     TONSILLECTOMY AND ADENOIDECTOMY     Two back surgeries/fusion     UMBILICAL HERNIA REPAIR  2010     OB History   No obstetric history on file.      Home Medications    Prior to Admission medications   Medication Sig Start Date End Date Taking? Authorizing Provider  albuterol (PROVENTIL) (2.5 MG/3ML) 0.083% nebulizer solution Take 2.5 mg by nebulization every 6 (six) hours as needed for wheezing or shortness of breath.  04/09/19   [provider]  albuterol (VENTOLIN HFA) 108 (90 Base) MCG/ACT inhaler  Inhale 2 puffs into the lungs every 6 (six) hours as needed for wheezing or shortness of breath.  04/09/19   [provider]  allopurinol (ZYLOPRIM) 100 MG tablet Take 100 mg by mouth 2 (two) times daily.  10/09/12   [provider]  amitriptyline (ELAVIL) 100 MG tablet Take 100 mg by mouth at bedtime.      [provider]  apixaban (ELIQUIS) 5 MG TABS tablet Take 1 tablet (5 mg total) by mouth 2 (two) times daily. 06/21/19   Johnson, Clanford L, MD  aspirin EC 81 MG tablet Take 81 mg by mouth daily.    [provider]  atorvastatin (LIPITOR) 40 MG tablet Take 1 tablet (40 mg total) by mouth at bedtime. 05/04/18   Satira Sark, MD  BIOTIN PO Take 1 capsule by mouth daily.    [provider]  diltiazem (CARDIZEM CD) 120 MG 24 hr capsule Take 1  capsule (120 mg total) by mouth daily. 06/22/19   Johnson, Clanford L, MD  esomeprazole (NEXIUM) 40 MG capsule TAKE 1 CAPSULE TWICE A DAY 30 MINUTES PRIOR TO MEALS 01/19/18   Annitta Needs, NP  famotidine (PEPCID) 40 MG tablet TAKE 1 TABLET AT BEDTIME AS NEEDED TO CONTROL REFLUX 06/23/15   Mannam, Praveen, MD  feeding supplement, GLUCERNA SHAKE, (GLUCERNA SHAKE) LIQD Take 237 mLs by mouth 2 (two) times daily between meals.    [provider]  guaiFENesin (MUCINEX) 600 MG 12 hr tablet Take 1 tablet (600 mg total) by mouth 2 (two) times daily. 06/08/19   Barton Dubois, MD  hydrALAZINE (APRESOLINE) 25 MG tablet Take 25 mg by mouth 2 (two) times daily.    [provider]  insulin lispro (HUMALOG KWIKPEN) 100 UNIT/ML KwikPen Inject 5 Units into the skin 3 (three) times daily.    [provider]  LANTUS SOLOSTAR 100 UNIT/ML SOPN Inject 8 Units into the skin at bedtime.  10/06/12   [provider]  loperamide (IMODIUM) 2 MG capsule Take 2 mg by mouth every 6 (six) hours as needed for diarrhea or loose stools.     [provider]  metolazone (ZAROXOLYN) 2.5 MG tablet Take 2.5 mg by mouth. On Monday,Wednesday,Friday    [provider]  metoprolol succinate (TOPROL-XL) 25 MG 24 hr tablet Take 25 mg by mouth daily. Along with 50 mg to = 75 mg    [provider]  metoprolol succinate (TOPROL-XL) 50 MG 24 hr tablet Take 50 mg by mouth daily. Along with 25 mg to = 75 mg    [provider]  mirtazapine (REMERON) 7.5 MG tablet Take 7.5 mg by mouth at bedtime.    [provider]  OXYGEN Inhale 2 L into the lungs. Every Shift    [provider]  potassium chloride SA (KLOR-CON) 20 MEQ tablet Take 20 mEq by mouth daily.    [provider]  torsemide (DEMADEX) 10 MG tablet Take 30 mg by mouth daily. 07/01/19   [provider]  torsemide (DEMADEX) 100 MG tablet Take 100 mg by mouth daily. 07/01/19   [provider]  TRELEGY ELLIPTA 100-62.5-25 MCG/INH AEPB Inhale 1 puff into the lungs daily. 05/13/19   [provider]    Family History Family History  Problem Relation Age of Onset   Colon cancer Brother        diagnosed age 85. Living.    Ulcers Sister    Diabetes Sister    Heart attack  Sister    Kidney failure Sister    Stroke Sister    Ulcers Mother    Diabetes Mother    Heart attack Mother    Stroke Mother    Asthma Mother    Heart disease Mother    Cervical cancer Mother    Heart attack Brother    Heart disease Brother    Asthma Sister    Diabetes Brother    Stroke Maternal Grandmother    Heart attack Maternal Grandmother    Heart attack Other    Early death Father        MVA in his 70s    Social History Social History   Tobacco Use   Smoking status: Former Smoker    Packs/day: 1.00    Years: 1.00    Pack years: 1.00    Types: Cigarettes    Start date: 02/19/1961    Quit date: 08/05/1961    Years since quitting: 57.9   Smokeless tobacco: Never Used   Tobacco comment: Quit smoking x 50 years  Substance Use Topics   Alcohol use: No   Drug use: No     Allergies   Patient has no known allergies.   Review of Systems Review of Systems  Constitutional: Positive for fatigue. Negative for chills and fever.  HENT: Negative for congestion, rhinorrhea and sore throat.   Eyes: Negative for visual disturbance.  Respiratory: Negative for cough and shortness of breath.   Cardiovascular: Positive for leg swelling. Negative for chest pain.  Gastrointestinal: Negative for abdominal pain, diarrhea, nausea and vomiting.  Genitourinary: Negative for dysuria.  Musculoskeletal: Negative for back pain and neck pain.  Skin: Negative for rash.  Neurological: Negative for dizziness, light-headedness and headaches.  Hematological: Does not bruise/bleed easily.  Psychiatric/Behavioral: Negative for confusion.     Physical Exam Updated  Vital Signs BP 137/65 (BP Location: Right Arm)    Pulse 92    Temp 98.6 F (37 C) (Oral)    Resp 16    Ht 1.626 m (5\' 4" )    Wt 94.5 kg    SpO2 96%    BMI 35.76 kg/m   Physical Exam Vitals signs and nursing note reviewed.  Constitutional:      General: She is not in acute distress.    Appearance: Normal appearance. She is well-developed.  HENT:     Head: Normocephalic and atraumatic.  Eyes:     Extraocular Movements: Extraocular movements intact.     Conjunctiva/sclera: Conjunctivae normal.     Pupils: Pupils are equal, round, and reactive to light.  Neck:     Musculoskeletal: Neck supple.  Cardiovascular:     Rate and Rhythm: Normal rate and regular rhythm.     Heart sounds: No murmur.  Pulmonary:     Effort: Pulmonary effort is normal. No respiratory distress.     Breath sounds: Normal breath sounds.  Abdominal:     Palpations: Abdomen is soft.     Tenderness: There is no abdominal tenderness.  Musculoskeletal:     Right lower leg: Edema present.     Left lower leg: Edema present.     Comments: Healing by a large blister on the left anterior leg.  Some slight erythema to both legs.  Slight edema.  Skin:    General: Skin is warm and dry.  Neurological:     General: No focal deficit present.     Mental Status: She is alert. Mental status is at baseline.  ED Treatments / Results  Labs (all labs ordered are listed, but only abnormal results are displayed) Labs Reviewed  CBC WITH DIFFERENTIAL/PLATELET - Abnormal; Notable for the following components:      Result Value   RBC 3.31 (*)    Hemoglobin 8.9 (*)    HCT 29.9 (*)    MCHC 29.8 (*)    Abs Immature Granulocytes 0.48 (*)    All other components within normal limits  BRAIN NATRIURETIC PEPTIDE - Abnormal; Notable for the following components:   B Natriuretic Peptide 321.0 (*)    All other components within normal limits  TROPONIN I (HIGH SENSITIVITY) - Abnormal; Notable for the following components:    Troponin I (High Sensitivity) 29 (*)    All other components within normal limits  TROPONIN I (HIGH SENSITIVITY) - Abnormal; Notable for the following components:   Troponin I (High Sensitivity) 44 (*)    All other components within normal limits  LACTIC ACID, PLASMA  TROPONIN I (HIGH SENSITIVITY)  TROPONIN I (HIGH SENSITIVITY)    EKG EKG Interpretation  Date/Time:  Monday July 05 2019 09:54:32 EST Ventricular Rate:  91 PR Interval:    QRS Duration: 115 QT Interval:  384 QTC Calculation: 473 R Axis:   10 Text Interpretation: Sinus rhythm Incomplete left bundle branch block Borderline ST elevation, anterior leads Confirmed by Fredia Sorrow [phone]) on 07/05/2019 9:59:13 AM   Radiology Dg Chest 2 View  Result Date: 07/05/2019 CLINICAL DATA:  Congestion and shortness of breath EXAM: CHEST - 2 VIEW COMPARISON:  Chest radiograph July 04, 2019 FINDINGS: Port-A-Cath tip is in the superior vena cava. No pneumothorax. There is mild interstitial prominence with suspected mild interstitial edema. No consolidation. Heart is mildly enlarged with pulmonary venous hypertension. No evident adenopathy. 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 There is degenerative change in the thoracic spine. IMPRESSION: There is a degree of pulmonary vascular congestion with mild interstitial edema. No consolidation. There may be a degree of congestive heart failure. Sizable paraesophageal hernia noted. Port-A-Cath tip in superior vena cava. Electronically Signed   By: Lowella Grip III M.D.   On: 07/05/2019 10:52   Ct Head Wo Contrast  Result Date: 07/05/2019 CLINICAL DATA:  Unexplained altered level of consciousness. EXAM: CT HEAD WITHOUT CONTRAST TECHNIQUE: Contiguous axial images were obtained from the base of the skull through the vertex without intravenous contrast. COMPARISON:  06/27/2015 FINDINGS: Brain: Age related atrophy. Mild chronic small-vessel change of the hemispheric white matter and  pons. 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 No sign of mass lesion, hemorrhage, hydrocephalus or extra-axial collection. Vascular: There is atherosclerotic calcification of the major vessels at the base of the brain. Skull: Negative Sinuses/Orbits: Paranasal sinuses are clear. Orbits are negative. Mastoid effusion on the right. Other: None IMPRESSION: No acute intracranial finding. Atrophy and chronic small-vessel ischemic changes as seen previously. Electronically Signed   By: Nelson Chimes M.D.   On: 07/05/2019 11:04   Dg Chest Portable 1 View  Result Date: 07/04/2019 CLINICAL DATA:  Altered mental status. Recent positive blood culture EXAM: PORTABLE CHEST 1 VIEW COMPARISON:  June 15, 2019 FINDINGS: Port-A-Cath tip is in the superior vena cava. No pneumothorax. There is no appreciable edema or consolidation. There is slight atelectatic change in the left lower lobe. Heart is upper normal in size with pulmonary vascularity normal. No adenopathy. 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 No bone lesions. IMPRESSION: Large paraesophageal hernia. Mild left base atelectasis. No edema or consolidation. Stable  cardiac silhouette. Port-A-Cath tip in superior vena cava. Electronically Signed   By: Lowella Grip III M.D.   On: 07/04/2019 11:26    Procedures Procedures (including critical care time)  Medications Ordered in ED Medications - No data to display   Initial Impression / Assessment and Plan / ED Course  I have reviewed the triage vital signs and the nursing notes.  Pertinent labs & imaging results that were available during my care of the patient were reviewed by me and considered in my medical decision making (see chart for details).        Patient with extensive work-up yesterday.  Opted to do CT head although mental status much better today.  Patient patient very alert.  Very verbal.  Son says that this is good day for her.  Does have some bilateral leg swelling.   But no significant edema a little bit of skin changes with erythema nothing consistent with cellulitis patient's blood cultures from yesterday negative so far Covid was negative yesterday chest x-ray was negative.  Lactic acids were negative.  Comparisons here today without any significant change.  Other than troponin was a little higher than yesterday which led to a delta troponin.  Which then went up to 44 giving her a delta change of 15.  Discussed with hospitalist.  Recommending third delta troponin.  Also do have a call out to cardiology.  But have not called back yet.  Patient clinically looking well.  Do not think she has had an acute cardiac event.  EKG had some subtle changes.  In the form of ST elevations in the anterior leads.  Patient denied any chest pain.  Head CT here today was negative.  Regarding the positive blood culture from 26 November.  Patient's lactic acid again is normal no leukocytosis.  Blood cultures negative so far from yesterday.  Do not feel the need to be repeated.  Discussed also with hospitalist very agree there is no concern there.  The  The dilemma is whether patient requires admission for the delta change in troponin.  If third troponin without significant change in by go back to [facility].  Or if cardiology clears.  If does require admission at cardiology's request.  Dr. Rica Koyanagi will do the admission.    Final Clinical Impressions(s) / ED Diagnoses   Final diagnoses:  Elevated troponin    ED Discharge Orders    None       Fredia Sorrow, MD 07/05/19 1610

## 2019-07-05 NOTE — ED Notes (Signed)
[facility] nursing staff states patient has been very congested since yesterday and that her usual level of mentation.

## 2019-07-05 NOTE — ED Triage Notes (Signed)
Pt brought in from [facility] center for altered mental status. Staff states they were unable to wake pt. Seen for same yesterday.

## 2019-07-05 NOTE — ED Provider Notes (Signed)
  Physical Exam  BP 137/65 (BP Location: Right Arm)   Pulse 92   Temp 98.6 F (37 C) (Oral)   Resp 16   Ht 5\' 4"  (1.626 m)   Wt 94.5 kg   SpO2 96%   BMI 35.76 kg/m   Physical Exam  ED Course/Procedures     .Critical Care Performed by: Varney Biles, MD Authorized by: Varney Biles, MD   Critical care provider statement:    Critical care time (minutes):  32   Critical care was necessary to treat or prevent imminent or life-threatening deterioration of the following conditions:  Respiratory failure   Critical care was time spent personally by me on the following activities:  Discussions with consultants, evaluation of patient's response to treatment, examination of patient, ordering and performing treatments and interventions, ordering and review of laboratory studies, ordering and review of radiographic studies, pulse oximetry, re-evaluation of patient's condition, obtaining history from patient or surrogate and review of old charts    MDM  Assuming care of patient from Dr. Rogene Houston,   Patient in the ED for altered mental status. Workup thus far shows reassuring CT scan of the brain and labs.  However patient's troponin is trending up.  Concerning findings are as following: Rising troponin without any chest pain. Important pending results are repeat troponin.  According to Dr. Venita Sheffield, plan is to follow-up with cardiology recommendations, consider admitting if the troponins are significantly high.  Patient had no complains, no concerns from the nursing side. Will continue to monitor.   5:55 PM Patient was reassessed.  She is noted to be hypoxic with O2 sats dropping to 85% on room air.  She has history of COPD and uses CPAP at night for OSA.  she has as needed oxygen, but normally does not need to use it when sitting.  There is no history of PE, DVT.  Patient is on Eliquis. I wonder if her altered mental status is because of hypoxia. Clinical suspicion for PE is  lower.  Based on history it seems like CHF is more likely.  Other possibilities just progression of her chronic lung disease.  Discussed findings with the patient and family.  We have agreed to admit her to the hospital especially as her troponin rise.  Cardiology service is still not called [place] back.  Hospitalist has been made aware of cards rec still pending, however if it does not seem like acute ischemia.  The patient appears reasonably screened and/or stabilized for discharge and I doubt any other medical condition or other [facility] requiring further screening, evaluation, or treatment in the ED at this time prior to discharge.   Results from the ER workup discussed with the patient face to face and all questions answered to the best of my ability. The patient is safe for discharge with strict return precautions.     Varney Biles, MD 07/05/19 1755

## 2019-07-05 NOTE — H&P (Addendum)
History and Physical    [name] FBP:[id number] DOB: 05-06-1946 DOA: 07/05/2019  PCP: [name], MD   Patient coming from: [facility]  I have personally briefly reviewed patient's old medical records in [place]  Chief Complaint: SOB, congestion, altered mental status  HPI: [name] is a 73 y.o. female with medical history significant for diastolic CHF, CKD 3, OSA on CPAP, COPD, atrial fibrillation, coronary artery disease, diabetes mellitus and hypertension. Patient was brought to the ED from the [facility] for reports of altered mental status.  Patient son is present at bedside and helps with the history.  The time of my evaluation patient is awake and alert and answers questions appropriately.  He also reports that he was finding it hard to wake her up, and the eyes were closed.  Patient's was in the ED yesterday for similar complaints, she was sent back to the pain center after the work-up was negative.  Patient's son reports some occasional episodes of similar occurrence which resolve.  Patient also reports chronic difficulty breathing, she reports some worsening of her difficulty breathing, but unable to tell me duration.  She has chronic lower extremity swelling that is improved compared to prior.  She reports a mild cough.  No fever no chills. At baseline she is on 2 L of oxygen mostly only with activity.  Recent hospitalization 11/10-11/16-for atrial flutter with RVR now resolved.  Also with decompensated CHF requiring IV Lasix.  Converted to normal sinus rhythm.  Discharged home on apixaban.  Subsequently discharged to nursing home for rehabilitation.  ED Course: O2 sats 96% on 2 L, on room air patient's O2 sat dropped to 85%. Hs troponin 29 > 44 > 53.  BNP 321.  Chest x-ray shows a degree of pulmonary vascular congestion and the degree of CHF.  EKG shows nonspecific T wave changes to lateral leads. Hospitalist to admit for elevated  troponin, worsening hypoxia.  Review of Systems: As per HPI all other systems reviewed and negative.  Past Medical History:  Diagnosis Date   Blood transfusion without reported diagnosis    CAD (coronary artery disease)    Nonobstructive at cardiac catheterization 2000   Cataract    Cervical cancer ([place]) 1978   CHF (congestive heart failure) (HCC)    Chronic back pain    Chronic kidney disease    COPD (chronic obstructive pulmonary disease) (HCC)    Degenerative disc disease    Essential hypertension, benign    GERD (gastroesophageal reflux disease)    Gout    Hiatal hernia 07/27/2013   History of diverticulitis of colon    History of hiatal hernia    Iron deficiency anemia    Irritable bowel syndrome    Lumbar radiculopathy    Mixed hyperlipidemia    Neuropathy    Osteoporosis    Ovarian cancer ([place]) 1978   patient denies. States this was cervical cancer   Oxygen deficiency    Sleep apnea    Type 2 diabetes mellitus ([place])    Vitamin B deficiency 12/25/2009   Vitamin B12 deficiency     Past Surgical History:  Procedure Laterality Date   ABDOMINAL HYSTERECTOMY     BACK SURGERY     Benign breast cysts     CHOLECYSTECTOMY     COLONOSCOPY  10/01/2006   SLF:Pan colonic diverticulosis and moderate internal hemorrhoids/ Otherwise no polyps, masses, inflammatory changes or AVMs/   COLONOSCOPY  2011   SLF:  pancolonic diverticulosis, large internal hemorrhoids   COLONOSCOPY N/A 01/26/2016   Procedure: COLONOSCOPY;  Surgeon: [name], MD;  Location: [facility];  Service: Endoscopy;  Laterality: N/A;  830    ESOPHAGOGASTRODUODENOSCOPY  11/19/2006   SLF: Large hiatal hernia without evidence of Cameron ulcers/. Distal esophageal stricture, which allowed the gastroscope to pass without resistance.  A 16 mm Savary later passed with mild resistance/ Normal stomach.sb bx negative   ESOPHAGOGASTRODUODENOSCOPY  10/01/2006   EHM:CNOBS hiatal  hernia.  Distal esophagus without evidence of   erythema, ulceration or Barrett's esophagus   ESOPHAGOGASTRODUODENOSCOPY  2011   SLF: large hh, distal esophageal web narrowing to 49mm s/p dilation to 41mm   ESOPHAGOGASTRODUODENOSCOPY N/A 08/06/2013   SLF: 1. Stricture at the gastroesophageal junction 2. large hiatal hernia. 3. Mild erosive gastritis.   GIVENS CAPSULE STUDY N/A 08/06/2013   INCOMPLETE-SMALL BOWLE ULCERS   KNEE SURGERY Right    PARTIAL HYSTERECTOMY  1978   small bowel capsule  2008   negative   SPINE SURGERY     TONSILLECTOMY AND ADENOIDECTOMY     Two back surgeries/fusion     UMBILICAL HERNIA REPAIR  2010     reports that she quit smoking about 57 years ago. Her smoking use included cigarettes. She started smoking about 58 years ago. She has a 1.00 pack-year smoking history. She has never used smokeless tobacco. She reports that she does not drink alcohol or use drugs.  No Known Allergies  Family History  Problem Relation Age of Onset   Colon cancer Brother        diagnosed age 53. Living.    Ulcers Sister    Diabetes Sister    Heart attack Sister    Kidney failure Sister    Stroke Sister    Ulcers Mother    Diabetes Mother    Heart attack Mother    Stroke Mother    Asthma Mother    Heart disease Mother    Cervical cancer Mother    Heart attack Brother    Heart disease Brother    Asthma Sister    Diabetes Brother    Stroke Maternal Grandmother    Heart attack Maternal Grandmother    Heart attack Other    Early death Father        MVA in his 60s    Prior to Admission medications   Medication Sig Start Date End Date Taking? Authorizing Provider  albuterol (PROVENTIL) (2.5 MG/3ML) 0.083% nebulizer solution Take 2.5 mg by nebulization every 6 (six) hours as needed for wheezing or shortness of breath.  04/09/19  Yes [provider]  albuterol (VENTOLIN HFA) 108 (90 Base) MCG/ACT inhaler Inhale 2 puffs into the lungs every  6 (six) hours as needed for wheezing or shortness of breath.  04/09/19  Yes [provider]  allopurinol (ZYLOPRIM) 100 MG tablet Take 100 mg by mouth 2 (two) times daily.  10/09/12  Yes [provider]  aspirin EC 81 MG tablet Take 81 mg by mouth daily.   Yes [provider]  atorvastatin (LIPITOR) 40 MG tablet Take 1 tablet (40 mg total) by mouth at bedtime. 05/04/18  Yes Satira Sark, MD  Biotin 5000 MCG TABS Take 1 tablet by mouth daily.    Yes [provider]  diltiazem (CARDIZEM CD) 120 MG 24 hr capsule Take 1 capsule (120 mg total) by mouth daily. 06/22/19  Yes Johnson, Clanford L, MD  famotidine (PEPCID) 40 MG tablet  TAKE 1 TABLET AT BEDTIME AS NEEDED TO CONTROL REFLUX Patient taking differently: Take 40 mg by mouth at bedtime as needed. To control reflux 06/23/15  Yes Mannam, Praveen, MD  feeding supplement, GLUCERNA SHAKE, (GLUCERNA SHAKE) LIQD Take 237 mLs by mouth 2 (two) times daily between meals.   Yes [provider]  guaiFENesin (MUCINEX) 600 MG 12 hr tablet Take 1 tablet (600 mg total) by mouth 2 (two) times daily. Patient taking differently: Take 1,200 mg by mouth 2 (two) times daily.  06/08/19  Yes Barton Dubois, MD  hydrALAZINE (APRESOLINE) 25 MG tablet Take 25 mg by mouth 2 (two) times daily.   Yes [provider]  insulin lispro (HUMALOG KWIKPEN) 100 UNIT/ML KwikPen Inject 5 Units into the skin 3 (three) times daily.   Yes [provider]  LANTUS SOLOSTAR 100 UNIT/ML SOPN Inject 8 Units into the skin at bedtime.  10/06/12  Yes [provider]  loperamide (IMODIUM) 2 MG capsule Take 2 mg by mouth every 6 (six) hours as needed for diarrhea or loose stools.    Yes [provider]  mirtazapine (REMERON) 7.5 MG tablet Take 7.5 mg by mouth at bedtime.   Yes [provider]  potassium chloride SA (KLOR-CON) 20 MEQ tablet Take 20 mEq by mouth daily.   Yes [provider]  amitriptyline  (ELAVIL) 100 MG tablet Take 100 mg by mouth at bedtime.      [provider]  apixaban (ELIQUIS) 5 MG TABS tablet Take 1 tablet (5 mg total) by mouth 2 (two) times daily. 06/21/19   Johnson, Clanford L, MD  esomeprazole (NEXIUM) 40 MG capsule TAKE 1 CAPSULE TWICE A DAY 30 MINUTES PRIOR TO MEALS 01/19/18   Annitta Needs, NP  metolazone (ZAROXOLYN) 2.5 MG tablet Take 2.5 mg by mouth. On Monday,Wednesday,Friday    [provider]  metoprolol succinate (TOPROL-XL) 25 MG 24 hr tablet Take 25 mg by mouth daily. Along with 50 mg to = 75 mg    [provider]  metoprolol succinate (TOPROL-XL) 50 MG 24 hr tablet Take 50 mg by mouth daily. Along with 25 mg to = 75 mg    [provider]  OXYGEN Inhale 2 L into the lungs. Every Shift    [provider]  torsemide (DEMADEX) 10 MG tablet Take 30 mg by mouth daily. 07/01/19   [provider]  torsemide (DEMADEX) 100 MG tablet Take 100 mg by mouth daily. 07/01/19   [provider]  TRELEGY ELLIPTA 100-62.5-25 MCG/INH AEPB Inhale 1 puff into the lungs daily. 05/13/19   [provider]    Physical Exam: Vitals:   07/05/19 0910 07/05/19 0911 07/05/19 1800  BP: 137/65  (!) 126/40  Pulse: 92  100  Resp: 16  15  Temp: 98.6 F (37 C)    TempSrc: Oral    SpO2: 96%  93%  Weight:  94.5 kg   Height:  5\' 4"  (1.626 m)     Constitutional: NAD, calm, comfortable Vitals:   07/05/19 0910 07/05/19 0911 07/05/19 1800  BP: 137/65  (!) 126/40  Pulse: 92  100  Resp: 16  15  Temp: 98.6 F (37 C)    TempSrc: Oral    SpO2: 96%  93%  Weight:  94.5 kg   Height:  5\' 4"  (1.626 m)    Eyes: PERRL, lids and conjunctivae normal ENMT: Mucous membranes are moist. Posterior pharynx clear of any exudate or lesions. Neck: normal, supple, no  masses, no thyromegaly Respiratory: Crackles bases, no wheezing.  Normal respiratory effort. No accessory muscle use.  Cardiovascular: Regular rate and rhythm, no murmurs  / rubs / gallops. No extremity edema. 2+ pedal pulses.  Abdomen: no tenderness, no masses palpated. No hepatosplenomegaly. Bowel sounds positive.  Musculoskeletal: no clubbing / cyanosis. No joint deformity upper and lower extremities. Good ROM, no contractures. Normal muscle tone.  Skin: Chronic stasis changes left worse than right, circumscribed area of hyperpigmentation with woody induration to left lower extremity.  No ulcers. No induration Neurologic: CN 2-12 grossly intact. Sensation intact, DTR normal. Strength 5/5 in all 4.  Psychiatric: Normal judgment and insight. Alert and oriented x 3. Normal mood.   Labs on Admission: I have personally reviewed following labs and imaging studies  CBC: Recent Labs  Lab 06/30/19 1105 07/04/19 1132 07/05/19 1011  WBC 13.6* 9.9 9.3  NEUTROABS  --  7.9* 7.1  HGB 9.0* 10.0* 8.9*  HCT 29.2* 33.7* 29.9*  MCV 89.0 91.6 90.3  PLT 275 355 673   Basic Metabolic Panel: Recent Labs  Lab 06/30/19 1105 07/04/19 1132  NA 142 142  K 3.2* 3.9  CL 89* 93*  CO2 36* 35*  GLUCOSE 110* 126*  BUN 105* 116*  CREATININE 3.43* 3.31*  CALCIUM 7.8* 8.1*   Liver Function Tests: Recent Labs  Lab 07/04/19 1132  AST 18  ALT 11  ALKPHOS 70  BILITOT 1.1  PROT 7.0  ALBUMIN 2.7*   Urine analysis:    Component Value Date/Time   COLORURINE YELLOW 07/04/2019 1059   APPEARANCEUR CLEAR 07/04/2019 1059   LABSPEC 1.011 07/04/2019 1059   PHURINE 5.0 07/04/2019 [place] 07/04/2019 1059   HGBUR SMALL (A) 07/04/2019 1059   BILIRUBINUR NEGATIVE 07/04/2019 [place] 07/04/2019 1059   PROTEINUR NEGATIVE 07/04/2019 1059   UROBILINOGEN 0.2 01/29/2007 1500   NITRITE NEGATIVE 07/04/2019 1059   LEUKOCYTESUR NEGATIVE 07/04/2019 1059    Radiological Exams on Admission: Dg Chest 2 View  Result Date: 07/05/2019 CLINICAL DATA:  Congestion and shortness of breath EXAM: CHEST - 2 VIEW COMPARISON:  Chest radiograph July 04, 2019  FINDINGS: Port-A-Cath tip is in the superior vena cava. No pneumothorax. There is mild interstitial prominence with suspected mild interstitial edema. No consolidation. Heart is mildly enlarged with pulmonary venous hypertension. No evident adenopathy. 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 There is degenerative change in the thoracic spine. IMPRESSION: There is a degree of pulmonary vascular congestion with mild interstitial edema. No consolidation. There may be a degree of congestive heart failure. Sizable paraesophageal hernia noted. Port-A-Cath tip in superior vena cava. Electronically Signed   By: Lowella Grip III M.D.   On: 07/05/2019 10:52   Ct Head Wo Contrast  Result Date: 07/05/2019 CLINICAL DATA:  Unexplained altered level of consciousness. EXAM: CT HEAD WITHOUT CONTRAST TECHNIQUE: Contiguous axial images were obtained from the base of the skull through the vertex without intravenous contrast. COMPARISON:  06/27/2015 FINDINGS: Brain: Age related atrophy. Mild chronic small-vessel change of the hemispheric white matter and pons. 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 No sign of mass lesion, hemorrhage, hydrocephalus or extra-axial collection. Vascular: There is atherosclerotic calcification of the major vessels at the base of the brain. Skull: Negative Sinuses/Orbits: Paranasal sinuses are clear. Orbits are negative. Mastoid effusion on the right. Other: None IMPRESSION: No acute intracranial finding. Atrophy and chronic small-vessel ischemic changes as seen previously. Electronically Signed   By: Nelson Chimes  M.D.   On: 07/05/2019 11:04   Dg Chest Portable 1 View  Result Date: 07/04/2019 CLINICAL DATA:  Altered mental status. Recent positive blood culture EXAM: PORTABLE CHEST 1 VIEW COMPARISON:  June 15, 2019 FINDINGS: Port-A-Cath tip is in the superior vena cava. No pneumothorax. There is no appreciable edema or consolidation. There is slight atelectatic change in the  left lower lobe. Heart is upper normal in size with pulmonary vascularity normal. No adenopathy. 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 No bone lesions. IMPRESSION: Large paraesophageal hernia. Mild left base atelectasis. No edema or consolidation. Stable cardiac silhouette. Port-A-Cath tip in superior vena cava. Electronically Signed   By: Lowella Grip III M.D.   On: 07/04/2019 11:26    EKG: Independently reviewed.  Sinus rhythm, with left bundle branch block.  Flipped T wave in V4 through V6, are new compared to yesterday, prior EKGs with nonspecific T wave abnormalities in V4 through V6.  Assessment/Plan Active Problems:   Hypoxia   Mild decompensated CHF-x-ray shows suggest degree of pulmonary vascular congestion with mild interstitial edema.  BNP 321 close to prior when she was admitted for decompensated CHF..  Weights appear to be at baseline, peripherally her volume status appears stable.  Last echo 06/16/2019 EF 55 to 74%, grade 2 diastolic dysfunction. -IV Lasix 60 twice daily x2 doses -BMP a.m. -Torsemide dose recently adjusted 11/25 for concern for volume overload.   -Strict input output, daily weights  Atrial fibrillation-currently in sinus rhythm.  Rate Controlled and anticoagulated.  Hemoglobin 8.9 baseline 9-10. -Resume home apixaban, Cardizem, metoprolol. -Patient's MAR states that Eliquis was discontinued, I reached out to the nursing home and was told this is still an active medication, she is taking twice a day.  Also nursing home notes do not indicate that this medication was discontinued.  Diabetes mellitus- -Home Lantus at 8 units nightly - SSI - HgbA1c  Elevated troponin- Hs T 29 > 44 > 53. No chest pain.  Doubt ACS at this time.  EKG with T wave changes to lateral leads.  Hx of coronary artery disease mentioned in problem list, I do not see documentation of this in chart. -Trend troponin -Repeat EKG in the morning -Resume home aspirin, Eliquis,  statins.  COPD with chronic respiratory failure-stable at this time.  At baseline on 2 L of O2 with activity. -Resume home bronchodilators. -May need to increase O2 to daily use.  CKD 3-creatinine 3.3, about baseline. -IV Lasix, hold home torsemide for now.  Hypertension-stable. -Resume home diltiazem, hydralazine, metoprolol.  Recent coagulase-negative staph growth 11/26-likely contaminant, but just 1 culture obtained.  -  Her mental status is at baseline.  She is afebrile. - Follow-up blood and urine culture results obtained in the ED yesterday.   Son and patient complaining about some conversations mom had at the nursing home with staff, about patient's been told she would not be leaving the nursing home if she chose to.  Will consult social work.  PT evaluation.  DVT prophylaxis: Eliquis Code Status: Full code Family Communication: Son at bedside Disposition Plan: 1 to 2 days Consults called: None Admission status: Obs, telemetry   Bethena Roys MD [facility]  07/05/2019, 7:58 PM

## 2019-07-06 ENCOUNTER — Observation Stay (HOSPITAL_COMMUNITY): Payer: Medicare Other

## 2019-07-06 DIAGNOSIS — N179 Acute kidney failure, unspecified: Secondary | ICD-10-CM | POA: Diagnosis present

## 2019-07-06 DIAGNOSIS — J441 Chronic obstructive pulmonary disease with (acute) exacerbation: Secondary | ICD-10-CM | POA: Diagnosis present

## 2019-07-06 DIAGNOSIS — E782 Mixed hyperlipidemia: Secondary | ICD-10-CM | POA: Diagnosis present

## 2019-07-06 DIAGNOSIS — R778 Other specified abnormalities of plasma proteins: Secondary | ICD-10-CM | POA: Diagnosis not present

## 2019-07-06 DIAGNOSIS — J449 Chronic obstructive pulmonary disease, unspecified: Secondary | ICD-10-CM | POA: Diagnosis not present

## 2019-07-06 DIAGNOSIS — R0902 Hypoxemia: Secondary | ICD-10-CM

## 2019-07-06 DIAGNOSIS — I890 Lymphedema, not elsewhere classified: Secondary | ICD-10-CM | POA: Diagnosis present

## 2019-07-06 DIAGNOSIS — E1142 Type 2 diabetes mellitus with diabetic polyneuropathy: Secondary | ICD-10-CM | POA: Diagnosis present

## 2019-07-06 DIAGNOSIS — K5730 Diverticulosis of large intestine without perforation or abscess without bleeding: Secondary | ICD-10-CM | POA: Diagnosis present

## 2019-07-06 DIAGNOSIS — I480 Paroxysmal atrial fibrillation: Secondary | ICD-10-CM

## 2019-07-06 DIAGNOSIS — N1832 Chronic kidney disease, stage 3b: Secondary | ICD-10-CM | POA: Diagnosis present

## 2019-07-06 DIAGNOSIS — K2100 Gastro-esophageal reflux disease with esophagitis, without bleeding: Secondary | ICD-10-CM | POA: Diagnosis present

## 2019-07-06 DIAGNOSIS — I130 Hypertensive heart and chronic kidney disease with heart failure and stage 1 through stage 4 chronic kidney disease, or unspecified chronic kidney disease: Secondary | ICD-10-CM | POA: Diagnosis present

## 2019-07-06 DIAGNOSIS — E869 Volume depletion, unspecified: Secondary | ICD-10-CM | POA: Diagnosis present

## 2019-07-06 DIAGNOSIS — I2510 Atherosclerotic heart disease of native coronary artery without angina pectoris: Secondary | ICD-10-CM | POA: Diagnosis present

## 2019-07-06 DIAGNOSIS — G9341 Metabolic encephalopathy: Secondary | ICD-10-CM | POA: Diagnosis present

## 2019-07-06 DIAGNOSIS — E669 Obesity, unspecified: Secondary | ICD-10-CM | POA: Diagnosis present

## 2019-07-06 DIAGNOSIS — Z6835 Body mass index (BMI) 35.0-35.9, adult: Secondary | ICD-10-CM | POA: Diagnosis not present

## 2019-07-06 DIAGNOSIS — H81399 Other peripheral vertigo, unspecified ear: Secondary | ICD-10-CM | POA: Diagnosis present

## 2019-07-06 DIAGNOSIS — K449 Diaphragmatic hernia without obstruction or gangrene: Secondary | ICD-10-CM | POA: Diagnosis present

## 2019-07-06 DIAGNOSIS — K219 Gastro-esophageal reflux disease without esophagitis: Secondary | ICD-10-CM | POA: Diagnosis present

## 2019-07-06 DIAGNOSIS — I5031 Acute diastolic (congestive) heart failure: Secondary | ICD-10-CM | POA: Diagnosis present

## 2019-07-06 DIAGNOSIS — G4733 Obstructive sleep apnea (adult) (pediatric): Secondary | ICD-10-CM | POA: Diagnosis present

## 2019-07-06 DIAGNOSIS — Z20828 Contact with and (suspected) exposure to other viral communicable diseases: Secondary | ICD-10-CM | POA: Diagnosis present

## 2019-07-06 DIAGNOSIS — N390 Urinary tract infection, site not specified: Secondary | ICD-10-CM | POA: Diagnosis present

## 2019-07-06 DIAGNOSIS — E1122 Type 2 diabetes mellitus with diabetic chronic kidney disease: Secondary | ICD-10-CM | POA: Diagnosis present

## 2019-07-06 DIAGNOSIS — B9689 Other specified bacterial agents as the cause of diseases classified elsewhere: Secondary | ICD-10-CM | POA: Diagnosis present

## 2019-07-06 LAB — CBC
HCT: 27.8 % — ABNORMAL LOW (ref 36.0–46.0)
Hemoglobin: 8.3 g/dL — ABNORMAL LOW (ref 12.0–15.0)
MCH: 26.9 pg (ref 26.0–34.0)
MCHC: 29.9 g/dL — ABNORMAL LOW (ref 30.0–36.0)
MCV: 90 fL (ref 80.0–100.0)
Platelets: 374 10*3/uL (ref 150–400)
RBC: 3.09 MIL/uL — ABNORMAL LOW (ref 3.87–5.11)
RDW: 15.4 % (ref 11.5–15.5)
WBC: 11.1 10*3/uL — ABNORMAL HIGH (ref 4.0–10.5)
nRBC: 0 % (ref 0.0–0.2)

## 2019-07-06 LAB — BASIC METABOLIC PANEL
Anion gap: 12 (ref 5–15)
BUN: 120 mg/dL — ABNORMAL HIGH (ref 8–23)
CO2: 37 mmol/L — ABNORMAL HIGH (ref 22–32)
Calcium: 8 mg/dL — ABNORMAL LOW (ref 8.9–10.3)
Chloride: 96 mmol/L — ABNORMAL LOW (ref 98–111)
Creatinine, Ser: 3.95 mg/dL — ABNORMAL HIGH (ref 0.44–1.00)
GFR calc Af Amer: 12 mL/min — ABNORMAL LOW (ref >60)
GFR calc non Af Amer: 11 mL/min — ABNORMAL LOW (ref >60)
Glucose, Bld: 104 mg/dL — ABNORMAL HIGH (ref 70–99)
Potassium: 3.5 mmol/L (ref 3.5–5.1)
Sodium: 145 mmol/L (ref 135–145)

## 2019-07-06 LAB — NA AND K (SODIUM & POTASSIUM), RAND UR
Potassium Urine: 11 mmol/L
Sodium, Ur: 72 mmol/L

## 2019-07-06 LAB — URINE CULTURE: Culture: >=100000 — AB

## 2019-07-06 LAB — GLUCOSE, CAPILLARY
Glucose-Capillary: 96 mg/dL (ref 70–99)
Glucose-Capillary: 112 mg/dL — ABNORMAL HIGH (ref 70–99)
Glucose-Capillary: 101 mg/dL — ABNORMAL HIGH (ref 70–99)
Glucose-Capillary: 125 mg/dL — ABNORMAL HIGH (ref 70–99)

## 2019-07-06 NOTE — Progress Notes (Signed)
PROGRESS NOTE [facility] [facility]   Rita GAAL  GHW:[id number]  DOB: 1946-03-25  DOA: 07/05/2019 PCP: [name], MD   Brief Admission Hx: 73 y.o. female with medical history significant for diastolic CHF, CKD 3, OSA on CPAP, COPD, atrial fibrillation, coronary artery disease, diabetes mellitus and hypertension. Patient was brought to the ED from the [facility] for reports of altered mental status.  MDM/Assessment & Plan:   1. Mild acute CHF exacerbation - Pt has responded well to IV lasix and has diuresed since admission.  Following weights and I/Os closely.  Diuretic holiday per nephrology service. 2. Paroxysmal atrial fibrillation -her heart rate remains well controlled on current medications which we will continue and she is fully anticoagulated with apixaban.  Continue metoprolol and Cardizem. 3. Altered mentation-this seems to be intermittent however she seems to be back to her baseline this morning.  I think her worsening CKD is contributing to her altered mentation.  We have asked for nephrology consultation for assistance. 4. Type 2 diabetes mellitus-resume home Lantus and's SSI coverage and CBG monitoring. 5. Stable COPD-continue home bronchodilators. 6. Stage IIIb CKD-creatinine seems to be worsening with diuretic use and I have asked for nephrology consultation.  They have seen her and recommended a diuretic holiday.  Recheck labs in a.m.  Appreciate nephrology consult.  Renal ultrasound pending.  Urine electrolytes pending. 7. Essential hypertension-blood pressures well controlled on current home medications which have been continued.  DVT prophylaxis: Apixaban Code Status: Full Family Communication: Patient updated at bedside Disposition Plan: Continue inpatient care, anticipate returning to SNF at discharge   Consultants:  Nephrology  Procedures:    Antimicrobials:     Subjective: Patient is awake and alert and with no specific  complaints.  Objective: Vitals:   07/05/19 2049 07/06/19 0300 07/06/19 0432 07/06/19 1109  BP: (!) 162/59  (!) 133/59 (!) 147/63  Pulse: (!) 55  96 (!) 105  Resp: 20  20   Temp: 98.6 F (37 C)  98.5 F (36.9 C)   TempSrc: Oral  Oral   SpO2: 93%  94%   Weight:  90.8 kg    Height:        Intake/Output Summary (Last 24 hours) at 07/06/2019 1433 Last data filed at 07/06/2019 0800 Gross per 24 hour  Intake 240 ml  Output --  Net 240 ml   Filed Weights   07/05/19 0911 07/06/19 0300  Weight: 94.5 kg 90.8 kg     REVIEW OF SYSTEMS  As per history otherwise all reviewed and reported negative  Exam:  General exam: Chronically ill-appearing female she is mostly bedbound in the bed in no apparent distress cooperative and oriented x3.  Respiratory system: bibasilar crackles. No increased work of breathing. Cardiovascular system: S1 & S2 heard. No JVD, murmurs, gallops, clicks or pedal edema. Gastrointestinal system: Abdomen is nondistended, soft and nontender. Normal bowel sounds heard. Central nervous system: Alert and oriented. No focal neurological deficits. Extremities: chronic lymphedema BLEs.  Data Reviewed: Basic Metabolic Panel: Recent Labs  Lab 06/30/19 1105 07/04/19 1132 07/05/19 1955 07/06/19 0447  NA 142 142 139 145  K 3.2* 3.9 3.7 3.5  CL 89* 93* 94* 96*  CO2 36* 35* 34* 37*  GLUCOSE 110* 126* 145* 104*  BUN 105* 116* 116* 120*  CREATININE 3.43* 3.31* 4.20* 3.95*  CALCIUM 7.8* 8.1* 7.6* 8.0*   Liver Function Tests: Recent Labs  Lab 07/04/19 1132  AST 18  ALT 11  ALKPHOS 70  BILITOT  1.1  PROT 7.0  ALBUMIN 2.7*   No results for input(s): LIPASE, AMYLASE in the last 168 hours. No results for input(s): AMMONIA in the last 168 hours. CBC: Recent Labs  Lab 06/30/19 1105 07/04/19 1132 07/05/19 1011 07/06/19 0447  WBC 13.6* 9.9 9.3 11.1*  NEUTROABS  --  7.9* 7.1  --   HGB 9.0* 10.0* 8.9* 8.3*  HCT 29.2* 33.7* 29.9* 27.8*  MCV 89.0 91.6 90.3  90.0  PLT 275 355 333 374   Cardiac Enzymes: No results for input(s): CKTOTAL, CKMB, CKMBINDEX, TROPONINI in the last 168 hours. CBG (last 3)  Recent Labs    07/05/19 2111 07/06/19 0924 07/06/19 1138  GLUCAP 110* 96 112*   Recent Results (from the past 240 hour(s))  Culture, blood (Routine X 2) w Reflex to ID Panel     Status: Abnormal   Collection Time: 07/01/19 12:55 PM   Specimen: BLOOD  Result Value Ref Range Status   Specimen Description   Final    BLOOD PORTA CATH Performed at [facility], [address] [place], [place] [address]    Special Requests   Final    BOTTLES DRAWN AEROBIC AND ANAEROBIC Blood Culture adequate volume Performed at [facility], [address], [place], [place] [address]    Culture  Setup Time   Final    GRAM POSITIVE COCCI AEROBIC BOTTLE ONLY Gram Stain Report Called to,Read Back By and Verified With: TEJEDA @ 1062 ON [id number] BY HENDERSON L. Performed at [facility], [address] [place], [place] [address]    Culture (A)  Final    STAPHYLOCOCCUS SPECIES (COAGULASE NEGATIVE) THE SIGNIFICANCE OF ISOLATING THIS ORGANISM FROM A SINGLE VENIPUNCTURE CANNOT BE PREDICTED WITHOUT FURTHER CLINICAL AND CULTURE CORRELATION. SUSCEPTIBILITIES AVAILABLE ONLY ON REQUEST. Performed at [facility], [place] [address] [place], [place] [address]    Report Status 07/04/2019 FINAL  Final  Urine culture     Status: Abnormal (Preliminary result)   Collection Time: 07/04/19 10:59 AM   Specimen: Urine, Random  Result Value Ref Range Status   Specimen Description   Final    URINE, RANDOM Performed at [facility], [address] [place], [place] [address]    Special Requests   Final    NONE Performed at [facility], [address], [place], [place] [address]    Culture (A)  Final    >=100,000 COLONIES/mL GRAM NEGATIVE RODS IDENTIFICATION AND SUSCEPTIBILITIES TO FOLLOW Performed at [facility], [place] [address], [place], [place] [address]     Report Status PENDING  Incomplete  SARS CORONAVIRUS 2 (TAT 6-24 HRS) Nasopharyngeal Nasopharyngeal Swab     Status: None   Collection Time: 07/04/19 11:17 AM   Specimen: Nasopharyngeal Swab  Result Value Ref Range Status   SARS Coronavirus 2 NEGATIVE NEGATIVE Final    Comment: (NOTE) SARS-CoV-2 target nucleic acids are NOT DETECTED. The SARS-CoV-2 RNA is generally detectable in upper and lower respiratory specimens during the acute phase of infection. Negative results do not preclude SARS-CoV-2 infection, do not rule out co-infections with other pathogens, and should not be used as the sole basis for treatment or other patient management decisions. Negative results must be combined with clinical observations, patient history, and epidemiological information. The expected result is Negative. Fact Sheet for Patients: SugarRoll.be Fact Sheet for Healthcare Providers: https://www.woods-mathews.com/ This test is not yet approved or cleared by the [place] FDA and  has been authorized for detection and/or diagnosis of SARS-CoV-2  by FDA under an Emergency Use Authorization (EUA). This EUA will remain  in effect (meaning this test can be used) for the duration of the COVID-19 declaration under Section 56 4(b)(1) of the Act, 21 U.S.C. section 360bbb-3(b)(1), unless the authorization is terminated or revoked sooner. Performed at [facility], [place] [address] [place], [place] [address]   Culture, blood (routine x 2)     Status: None (Preliminary result)   Collection Time: 07/04/19 11:32 AM   Specimen: Right Antecubital; Blood  Result Value Ref Range Status   Specimen Description RIGHT ANTECUBITAL BOTTLES DRAWN AEROBIC ONLY  Final   Special Requests Blood Culture adequate volume  Final   Culture   Final    NO GROWTH 2 DAYS Performed at [facility], [address], [place], [place] [address]    Report Status PENDING  Incomplete  Culture,  blood (routine x 2)     Status: None (Preliminary result)   Collection Time: 07/04/19 11:33 AM   Specimen: Left Antecubital; Blood  Result Value Ref Range Status   Specimen Description LEFT ANTECUBITAL BOTTLES DRAWN AEROBIC ONLY  Final   Special Requests   Final    Blood Culture results may not be optimal due to an inadequate volume of blood received in culture bottles   Culture   Final    NO GROWTH 2 DAYS Performed at [facility], [address] [place], [place] [address]    Report Status PENDING  Incomplete     Studies: Dg Chest 2 View  Result Date: 07/05/2019 CLINICAL DATA:  Congestion and shortness of breath EXAM: CHEST - 2 VIEW COMPARISON:  Chest radiograph July 04, 2019 FINDINGS: Port-A-Cath tip is in the superior vena cava. No pneumothorax. There is mild interstitial prominence with suspected mild interstitial edema. No consolidation. Heart is mildly enlarged with pulmonary venous hypertension. No evident adenopathy. 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 There is degenerative change in the thoracic spine. IMPRESSION: There is a degree of pulmonary vascular congestion with mild interstitial edema. No consolidation. There may be a degree of congestive heart failure. Sizable paraesophageal hernia noted. Port-A-Cath tip in superior vena cava. Electronically Signed   By: Lowella Grip III M.D.   On: 07/05/2019 10:52   Ct Head Wo Contrast  Result Date: 07/05/2019 CLINICAL DATA:  Unexplained altered level of consciousness. EXAM: CT HEAD WITHOUT CONTRAST TECHNIQUE: Contiguous axial images were obtained from the base of the skull through the vertex without intravenous contrast. COMPARISON:  06/27/2015 FINDINGS: Brain: Age related atrophy. Mild chronic small-vessel change of the hemispheric white matter and pons. 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 No sign of mass lesion, hemorrhage, hydrocephalus or extra-axial collection. Vascular: There is atherosclerotic  calcification of the major vessels at the base of the brain. Skull: Negative Sinuses/Orbits: Paranasal sinuses are clear. Orbits are negative. Mastoid effusion on the right. Other: None IMPRESSION: No acute intracranial finding. Atrophy and chronic small-vessel ischemic changes as seen previously. Electronically Signed   By: Nelson Chimes M.D.   On: 07/05/2019 11:04   US Renal  Result Date: 07/06/2019 CLINICAL DATA:  Acute kidney injury. EXAM: RENAL / URINARY TRACT ULTRASOUND COMPLETE COMPARISON:  Noncontrast abdominal CT 05/31/2019, renal ultrasound 09/10/2017 FINDINGS: Right Kidney: Renal measurements: 8.9 x 5.6 x 6.1 cm = volume: 160 mL. No hydronephrosis. There is renal cortical thinning with increased parenchymal echogenicity. Mild scarring. Simple cyst measuring 1.2 cm in the upper kidney again seen. No suspicious solid lesion. No shadowing calculi.  Left Kidney: Renal measurements: 9.0 x 5.5 x 5.0 cm = volume: 132 mL. No hydronephrosis. There is thinning of renal parenchyma with increased renal echogenicity. Bladder: No bladder wall thickening. Both ureteral jets are visualized. Reported post void residual of 284 cc. Prevoid bladder volume not recorded. Other: None. IMPRESSION: 1. No hydronephrosis. 2. Thinning of bilateral renal parenchyma with increased renal echogenicity most consistent with chronic medical renal disease. 3. Post void residual of 284 cc. Electronically Signed   By: Keith Rake M.D.   On: 07/06/2019 12:45     Scheduled Meds:  allopurinol  100 mg Oral BID   apixaban  5 mg Oral BID   aspirin EC  81 mg Oral Daily   atorvastatin  40 mg Oral QHS   Chlorhexidine Gluconate Cloth  6 each Topical Daily   diltiazem  120 mg Oral Daily   feeding supplement (GLUCERNA SHAKE)  237 mL Oral BID BM   fluticasone furoate-vilanterol  1 puff Inhalation Daily   And   umeclidinium bromide  1 puff Inhalation Daily   hydrALAZINE  25 mg Oral BID   insulin aspart  0-5 Units  Subcutaneous QHS   insulin aspart  0-9 Units Subcutaneous TID WC   insulin glargine  8 Units Subcutaneous QHS   metoprolol succinate  75 mg Oral Daily   mirtazapine  7.5 mg Oral QHS   pantoprazole  40 mg Oral Daily   Continuous Infusions:  Active Problems:   Hypoxia   AKI (acute kidney injury) ([place])   Time spent:   Irwin Brakeman, MD [facility] 07/06/2019, 2:33 PM    LOS: 0 days  How to contact the [facility] Attending or Consulting provider [place] or covering provider during after hours [place], for this patient?  1. Check the care team in [facility] and look for a) attending/consulting TR[facility] provider listed and b) the [facility] team listed 2. Log into [email or web address] and use [place]'s universal password to access. If you do not have the password, please contact the hospital operator. 3. Locate the [facility] provider you are looking for under [facility] and page to a number that you can be directly reached. 4. If you still have difficulty reaching the provider, please page the [facility] (Director on Call) for the Hospitalists listed on amion for assistance.

## 2019-07-06 NOTE — Plan of Care (Signed)

## 2019-07-06 NOTE — Evaluation (Signed)
Physical Therapy Evaluation Patient Details Name: [name] MRN: [id number] DOB: [birth year] Today's Date: 07/06/2019   History of Present Illness  [name] is a 73 y.o. female with medical history significant for diastolic CHF, CKD 3, OSA on CPAP, COPD, atrial fibrillation, coronary artery disease, diabetes mellitus and hypertension.Patient was brought to the ED from the [facility] for reports of altered mental status.  Patient son is present at bedside and helps with the history.  The time of my evaluation patient is awake and alert and answers questions appropriately.  He also reports that he was finding it hard to wake her up, and the eyes were closed.  Patient's was in the ED yesterday for similar complaints, she was sent back to the pain center after the work-up was negative.  Patient's son reports some occasional episodes of similar occurrence which resolve.  Patient also reports chronic difficulty breathing, she reports some worsening of her difficulty breathing, but unable to tell me duration.  She has chronic lower extremity swelling that is improved compared to prior.  She reports a mild cough.  No fever no chills.At baseline she is on 2 L of oxygen mostly only with activity.    Clinical Impression  Patient unsteady on feet demonstrating slow labored movement for transferring to [facility] and chair, limited to ambulation to doorway and back secondary to c/o fatigue resulting in decreased balance and increased assistance to make it back to bedside.  Patient tolerated sitting up in chair after therapy - RN aware.  Patient will benefit from continued physical therapy in hospital and recommended venue below to increase strength, balance, endurance for safe ADLs and gait.     Follow Up Recommendations SNF    Equipment Recommendations  None recommended by PT    Recommendations for Other Services       Precautions / Restrictions Precautions Precautions:  Fall Restrictions Weight Bearing Restrictions: No      Mobility  Bed Mobility Overal bed mobility: Needs Assistance Bed Mobility: Supine to Sit;Sit to Supine     Supine to sit: Min guard Sit to supine: Min guard   General bed mobility comments: increased time, labored movement  Transfers Overall transfer level: Needs assistance Equipment used: Rolling walker (2 wheeled) Transfers: Sit to/from [facility] Sit to Stand: Min assist;Mod assist Stand pivot transfers: Min assist;Mod assist       General transfer comment: slow labored movement, incontinent of urine during transfer to [facility]  Ambulation/Gait Ambulation/Gait assistance: Min assist Gait Distance (Feet): 20 Feet Assistive device: Rolling walker (2 wheeled) Gait Pattern/deviations: Decreased step length - right;Decreased step length - left;Decreased stride length Gait velocity: decreased   General Gait Details: slow labored cadence while on 2 LPM with SpO2 at 89-90%, limited secondary to c/o of fatigue  Stairs            Wheelchair Mobility    Modified Rankin (Stroke Patients Only)       Balance Overall balance assessment: Needs assistance Sitting-balance support: Feet supported;No upper extremity supported Sitting balance-Leahy Scale: Fair Sitting balance - Comments: fair/good seated EOB   Standing balance support: During functional activity;Bilateral upper extremity supported Standing balance-Leahy Scale: Fair Standing balance comment: using RW                             Pertinent Vitals/Pain Pain Assessment: No/denies pain    Home Living Family/patient expects to be discharged to:: Private residence Living  Arrangements: Alone Available Help at Discharge: Family;Available PRN/intermittently Type of Home: House Home Access: Level entry     Home Layout: One level Home Equipment: Walker - 2 wheels;Shower seat;Bedside commode;Cane - single point      Prior  Function           Comments: household and short distanced community ambulator with RW, home O2 at 2 LPM PRN and at night with BIPAP     Hand Dominance   Dominant Hand: Right    Extremity/Trunk Assessment   Upper Extremity Assessment Upper Extremity Assessment: Generalized weakness    Lower Extremity Assessment Lower Extremity Assessment: Generalized weakness    Cervical / Trunk Assessment Cervical / Trunk Assessment: Normal  Communication   Communication: No difficulties  Cognition Arousal/Alertness: Awake/alert Behavior During Therapy: WFL for tasks assessed/performed Overall Cognitive Status: Within Functional Limits for tasks assessed                                        General Comments      Exercises     Assessment/Plan    PT Assessment Patient needs continued PT services  PT Problem List Decreased strength;Decreased activity tolerance;Decreased balance;Decreased mobility       PT Treatment Interventions Gait training;Stair training;Functional mobility training;Therapeutic activities;Therapeutic exercise;Patient/family education    PT Goals (Current goals can be found in the Care Plan section)  Acute Rehab PT Goals Patient Stated Goal: return home after rehab PT Goal Formulation: With patient Time For Goal Achievement: 07/20/19 Potential to Achieve Goals: Good    Frequency Min 3X/week   Barriers to discharge        Co-evaluation               AM-PAC PT "6 Clicks" Mobility  Outcome Measure Help needed turning from your back to your side while in a flat bed without using bedrails?: None Help needed moving from lying on your back to sitting on the side of a flat bed without using bedrails?: A Little Help needed moving to and from a bed to a chair (including a wheelchair)?: A Little Help needed standing up from a chair using your arms (e.g., wheelchair or bedside chair)?: A Little Help needed to walk in hospital room?: A  Lot Help needed climbing 3-5 steps with a railing? : Total 6 Click Score: 16    End of Session Equipment Utilized During Treatment: Gait belt;Oxygen Activity Tolerance: Patient tolerated treatment well;Patient limited by fatigue Patient left: in chair;with call bell/phone within reach Nurse Communication: Mobility status PT Visit Diagnosis: Unsteadiness on feet (R26.81);Other abnormalities of gait and mobility (R26.89);Muscle weakness (generalized) (M62.81)    Time: 4098-1191 PT Time Calculation (min) (ACUTE ONLY): 36 min   Charges:   PT Evaluation $PT Eval Moderate Complexity: 1 Mod PT Treatments $Therapeutic Activity: 23-37 mins       12:30 PM, 07/06/19 Lonell Grandchild, MPT Physical Therapist with [facility] [phone] office [phone] mobile phone

## 2019-07-06 NOTE — Progress Notes (Signed)
Updated both sons [name] (in-person) and [name] (via telephone) on patient condition. Also clarified current visitor policy with both due to misinformation they received yesterday in the ED. Both state understanding.

## 2019-07-06 NOTE — Consult Note (Addendum)
[facility] Nephrology Consultation Note  Requesting MD: Dr Wynetta Emery, [facility] Reason for consult: AKI on CKD  HPI:  [name] is a 73 y.o. female with HTN, DM, obesity, OSA on CPAP, COPD, A. fib, CAD, CHF with preserved EF, CKD with baseline creatinine level around 2, presented from SNF with altered mental status and shortness of breath seen as a consultation at the request of Dr. Wynetta Emery for AKI on CKD.  Patient was recently admitted from 11/10 to 06/21/2019 for CHF exacerbation when she was treated with IV Lasix.  The serum creatinine level from 1.98-2.6.  Patient subsequently had labs done which showed creatinine level elevated to 3.4 on 11/25 and 4.2 on 11/30/120.  The urinalysis was normal with no protein or RBC or WBC.   On admission, she was initially confused which was apparently improved in ER.  She uses around 2 L of oxygen at home.  The BNP was elevated to 321 and a chest x-ray with some pulmonary vascular congestion and mild interstitial edema.  She received a doses of IV Lasix.  No urine output is recorded.  BUN was 116 and creatinine level 4.2.  At home she takes torsemide 100 mg.   Today, she is able to lie flat and using home dose 2 L of oxygen.  Her weight 90.8 from 94.5 yesterday.  Unknown if today's weight is reliable.  She has chronic lymphedema and obesity therefore very hard to assess the volume status.  No use of NSAIDs, no IV contrast.  She was alert awake and following commands today.  Seems like her mental status is around baseline.   Creat  Date/Time Value Ref Range Status  10/10/2017 10:50 AM 1.68 (H) 0.60 - 0.93 mg/dL Final    Comment:    For patients >38 years of age, the reference limit for Creatinine is approximately 13% higher for people identified as African-American. .   07/11/2017 04:19 PM 1.96 (H) 0.60 - 0.93 mg/dL Final    Comment:    For patients >69 years of age, the reference limit for Creatinine is approximately 13% higher  for people identified as African-American. .    Creatinine, Ser  Date/Time Value Ref Range Status  07/06/2019 04:47 AM 3.95 (H) 0.44 - 1.00 mg/dL Final  07/05/2019 07:55 PM 4.20 (H) 0.44 - 1.00 mg/dL Final  07/04/2019 11:32 AM 3.31 (H) 0.44 - 1.00 mg/dL Final  06/30/2019 11:05 AM 3.43 (H) 0.44 - 1.00 mg/dL Final  06/28/2019 08:30 AM 3.42 (H) 0.44 - 1.00 mg/dL Final  06/20/2019 06:41 AM 2.24 (H) 0.44 - 1.00 mg/dL Final  06/19/2019 06:58 AM 2.44 (H) 0.44 - 1.00 mg/dL Final  06/18/2019 05:05 AM 2.60 (H) 0.44 - 1.00 mg/dL Final  06/17/2019 09:16 AM 2.63 (H) 0.44 - 1.00 mg/dL Final  06/16/2019 07:30 AM 1.98 (H) 0.44 - 1.00 mg/dL Final  06/15/2019 01:40 PM 2.05 (H) 0.44 - 1.00 mg/dL Final  06/07/2019 04:07 AM 2.64 (H) 0.44 - 1.00 mg/dL Final  06/05/2019 04:46 AM 2.74 (H) 0.44 - 1.00 mg/dL Final  06/04/2019 05:01 AM 2.36 (H) 0.44 - 1.00 mg/dL Final  06/04/2019 05:01 AM 2.36 (H) 0.44 - 1.00 mg/dL Final  06/03/2019 07:05 AM 2.33 (H) 0.44 - 1.00 mg/dL Final  06/02/2019 04:15 AM 1.80 (H) 0.44 - 1.00 mg/dL Final  05/31/2019 04:58 PM 1.64 (H) 0.44 - 1.00 mg/dL Final  11/22/2018 09:11 PM 1.21 (H) 0.44 - 1.00 mg/dL Final  07/15/2018 05:27 AM 1.98 (H) 0.44 - 1.00 mg/dL Final  07/14/2018 05:19 AM 2.09 (H) 0.44 - 1.00 mg/dL Final  07/13/2018 11:23 AM 2.14 (H) 0.44 - 1.00 mg/dL Final  07/13/2018 08:09 AM 2.17 (H) 0.44 - 1.00 mg/dL Final  07/13/2018 06:24 AM 2.11 (H) 0.44 - 1.00 mg/dL Final  07/12/2018 01:35 PM 1.37 (H) 0.44 - 1.00 mg/dL Final  08/28/2017 11:27 AM 1.64 (H) 0.44 - 1.00 mg/dL Final  05/06/2017 03:25 PM 1.47 (H) 0.44 - 1.00 mg/dL Final  04/28/2017 10:11 AM 1.33 (H) 0.44 - 1.00 mg/dL Final  10/25/2016 11:25 AM 1.86 (H) 0.44 - 1.00 mg/dL Final  05/10/2016 06:09 AM 1.69 (H) 0.44 - 1.00 mg/dL Final  05/09/2016 05:38 AM 1.47 (H) 0.44 - 1.00 mg/dL Final  05/08/2016 12:30 AM 1.27 (H) 0.44 - 1.00 mg/dL Final  04/02/2016 12:00 PM 1.05 (H) 0.44 - 1.00 mg/dL Final  06/28/2015 05:43 AM 1.13 (H)  0.44 - 1.00 mg/dL Final  06/27/2015 07:58 PM 1.22 (H) 0.44 - 1.00 mg/dL Final  07/07/2013 06:32 AM 1.26 (H) 0.50 - 1.10 mg/dL Final  07/06/2013 06:20 AM 1.56 (H) 0.50 - 1.10 mg/dL Final  07/05/2013 09:53 AM 1.74 (H) 0.50 - 1.10 mg/dL Final  07/01/2013 06:18 AM 1.17 (H) 0.50 - 1.10 mg/dL Final  06/30/2013 09:54 AM 1.16 (H) 0.50 - 1.10 mg/dL Final  06/28/2013 04:15 PM 1.01 0.50 - 1.10 mg/dL Final  10/25/2008 04:12 PM 1.04 0.4 - 1.2 mg/dL Final  02/02/2007 03:20 AM 1.22 DELTA CHECK NOTED (H)  Final  01/31/2007 05:20 AM 2.22 (H)  Final  01/22/2007 01:38 PM 1.27 (H)  Final     PMHx:   Past Medical History:  Diagnosis Date  . Blood transfusion without reported diagnosis   . CAD (coronary artery disease)    Nonobstructive at cardiac catheterization 2000  . Cataract   . Cervical cancer ([place]) 1978  . CHF (congestive heart failure) ([place])   . Chronic back pain   . Chronic kidney disease   . COPD (chronic obstructive pulmonary disease) ([place])   . Degenerative disc disease   . Essential hypertension, benign   . GERD (gastroesophageal reflux disease)   . Gout   . Hiatal hernia 07/27/2013  . History of diverticulitis of colon   . History of hiatal hernia   . Iron deficiency anemia   . Irritable bowel syndrome   . Lumbar radiculopathy   . Mixed hyperlipidemia   . Neuropathy   . Osteoporosis   . Ovarian cancer [facility]) 1978   patient denies. States this was cervical cancer  . Oxygen deficiency   . Sleep apnea   . Type 2 diabetes mellitus ([place])   . Vitamin B deficiency 12/25/2009  . Vitamin B12 deficiency     Past Surgical History:  Procedure Laterality Date  . ABDOMINAL HYSTERECTOMY    . BACK SURGERY    . Benign breast cysts    . CHOLECYSTECTOMY    . COLONOSCOPY  10/01/2006   SLF:Pan colonic diverticulosis and moderate internal hemorrhoids/ Otherwise no polyps, masses, inflammatory changes or AVMs/  . COLONOSCOPY  2011   SLF: pancolonic diverticulosis, large internal hemorrhoids   . COLONOSCOPY N/A 01/26/2016   Procedure: COLONOSCOPY;  Surgeon: [name], MD;  Location: [facility] SUITE;  Service: Endoscopy;  Laterality: N/A;  830   . ESOPHAGOGASTRODUODENOSCOPY  11/19/2006   SLF: Large hiatal hernia without evidence of Cameron ulcers/. Distal esophageal stricture, which allowed the gastroscope to pass without resistance.  A 16 mm Savary later passed with mild resistance/ Normal stomach.sb bx negative  .  ESOPHAGOGASTRODUODENOSCOPY  10/01/2006   [id number]:[id number] hiatal hernia.  Distal esophagus without evidence of   erythema, ulceration or Barrett's esophagus  . ESOPHAGOGASTRODUODENOSCOPY  2011   SLF: large hh, distal esophageal web narrowing to 34mm s/p dilation to 71mm  . ESOPHAGOGASTRODUODENOSCOPY N/A 08/06/2013   SLF: 1. Stricture at the gastroesophageal junction 2. large hiatal hernia. 3. Mild erosive gastritis.  Marland Kitchen GIVENS CAPSULE STUDY N/A 08/06/2013   INCOMPLETE-SMALL BOWLE ULCERS  . KNEE SURGERY Right   . PARTIAL HYSTERECTOMY  1978  . small bowel capsule  2008   negative  . SPINE SURGERY    . TONSILLECTOMY AND ADENOIDECTOMY    . Two back surgeries/fusion    . UMBILICAL HERNIA REPAIR  2010    Family Hx:  Family History  Problem Relation Age of Onset  . Colon cancer Brother        diagnosed age 6. Living.   Marland Kitchen Ulcers Sister   . Diabetes Sister   . Heart attack Sister   . Kidney failure Sister   . Stroke Sister   . Ulcers Mother   . Diabetes Mother   . Heart attack Mother   . Stroke Mother   . Asthma Mother   . Heart disease Mother   . Cervical cancer Mother   . Heart attack Brother   . Heart disease Brother   . Asthma Sister   . Diabetes Brother   . Stroke Maternal Grandmother   . Heart attack Maternal Grandmother   . Heart attack Other   . Early death Father        MVA in his 83s    Social History:  reports that she quit smoking about 57 years ago. Her smoking use included cigarettes. She started smoking about 58 years ago. She has a 1.00  pack-year smoking history. She has never used smokeless tobacco. She reports that she does not drink alcohol or use drugs.  Allergies: No Known Allergies  Medications: Prior to Admission medications   Medication Sig Start Date End Date Taking? Authorizing Provider  albuterol (PROVENTIL) (2.5 MG/3ML) 0.083% nebulizer solution Take 2.5 mg by nebulization every 6 (six) hours as needed for wheezing or shortness of breath.  04/09/19  Yes [provider]  albuterol (VENTOLIN HFA) 108 (90 Base) MCG/ACT inhaler Inhale 2 puffs into the lungs every 6 (six) hours as needed for wheezing or shortness of breath.  04/09/19  Yes [provider]  allopurinol (ZYLOPRIM) 100 MG tablet Take 100 mg by mouth 2 (two) times daily.  10/09/12  Yes [provider]  apixaban (ELIQUIS) 5 MG TABS tablet Take 1 tablet (5 mg total) by mouth 2 (two) times daily. 06/21/19  Yes Johnson, Clanford L, MD  aspirin EC 81 MG tablet Take 81 mg by mouth daily.   Yes [provider]  atorvastatin (LIPITOR) 40 MG tablet Take 1 tablet (40 mg total) by mouth at bedtime. 05/04/18  Yes Satira Sark, MD  Biotin 5000 MCG TABS Take 1 tablet by mouth daily.    Yes [provider]  diltiazem (CARDIZEM CD) 120 MG 24 hr capsule Take 1 capsule (120 mg total) by mouth daily. 06/22/19  Yes Johnson, Clanford L, MD  esomeprazole (NEXIUM) 40 MG capsule TAKE 1 CAPSULE TWICE A DAY 30 MINUTES PRIOR TO MEALS Patient taking differently: Take 40 mg by mouth 2 (two) times daily before a meal.  01/19/18  Yes Annitta Needs, NP  famotidine (PEPCID) 40 MG tablet TAKE 1 TABLET AT BEDTIME  AS NEEDED TO CONTROL REFLUX Patient taking differently: Take 40 mg by mouth at bedtime as needed. To control reflux 06/23/15  Yes Mannam, Praveen, MD  feeding supplement, GLUCERNA SHAKE, (GLUCERNA SHAKE) LIQD Take 237 mLs by mouth 2 (two) times daily between meals.   Yes [provider]  guaiFENesin (MUCINEX) 600 MG 12 hr tablet Take  1 tablet (600 mg total) by mouth 2 (two) times daily. Patient taking differently: Take 1,200 mg by mouth 2 (two) times daily.  06/08/19  Yes Barton Dubois, MD  hydrALAZINE (APRESOLINE) 25 MG tablet Take 25 mg by mouth 2 (two) times daily.   Yes [provider]  insulin lispro (HUMALOG KWIKPEN) 100 UNIT/ML KwikPen Inject 5 Units into the skin 3 (three) times daily.   Yes [provider]  LANTUS SOLOSTAR 100 UNIT/ML SOPN Inject 8 Units into the skin at bedtime.  10/06/12  Yes [provider]  loperamide (IMODIUM) 2 MG capsule Take 2 mg by mouth every 6 (six) hours as needed for diarrhea or loose stools.    Yes [provider]  metoprolol succinate (TOPROL-XL) 25 MG 24 hr tablet Take 25 mg by mouth daily. Along with 50 mg to = 75 mg   Yes [provider]  metoprolol succinate (TOPROL-XL) 50 MG 24 hr tablet Take 50 mg by mouth daily. Along with 25 mg to = 75 mg   Yes [provider]  mirtazapine (REMERON) 7.5 MG tablet Take 7.5 mg by mouth at bedtime.   Yes [provider]  OXYGEN Inhale 2 L into the lungs. Every Shift   Yes [provider]  potassium chloride SA (KLOR-CON) 20 MEQ tablet Take 20 mEq by mouth daily.   Yes [provider]  torsemide (DEMADEX) 10 MG tablet Take 30 mg by mouth daily. 07/01/19  Yes [provider]  torsemide (DEMADEX) 100 MG tablet Take 100 mg by mouth daily. 07/01/19  Yes [provider]  TRELEGY ELLIPTA 100-62.5-25 MCG/INH AEPB Inhale 1 puff into the lungs daily. 05/13/19  Yes [provider]    I have reviewed the patient's current medications.  Labs:  Results for orders placed or performed during the hospital encounter of 07/05/19 (from the past 48 hour(s))  CBC with Differential     Status: Abnormal   Collection Time: 07/05/19 10:11 AM  Result Value Ref Range   WBC 9.3 4.0 - 10.5 K/uL   RBC 3.31 (L) 3.87 - 5.11 MIL/uL   Hemoglobin 8.9 (L) 12.0 - 15.0 g/dL    HCT 29.9 (L) 36.0 - 46.0 %   MCV 90.3 80.0 - 100.0 fL   MCH 26.9 26.0 - 34.0 pg   MCHC 29.8 (L) 30.0 - 36.0 g/dL   RDW 15.3 11.5 - 15.5 %   Platelets 333 150 - 400 K/uL   nRBC 0.0 0.0 - 0.2 %   Neutrophils Relative % 77 %   Neutro Abs 7.1 1.7 - 7.7 K/uL   Lymphocytes Relative 10 %   Lymphs Abs 1.0 0.7 - 4.0 K/uL   Monocytes Relative 5 %   Monocytes Absolute 0.5 0.1 - 1.0 K/uL   Eosinophils Relative 2 %   Eosinophils Absolute 0.2 0.0 - 0.5 K/uL   Basophils Relative 1 %   Basophils Absolute 0.1 0.0 - 0.1 K/uL   WBC Morphology MILD LEFT SHIFT (1-5% METAS, OCC MYELO, OCC BANDS)    Immature Granulocytes 5 %   Abs Immature Granulocytes 0.48 (H) 0.00 - 0.07 K/uL  Comment: Performed at [facility], [address] [place], [place] [address]  Lactic acid, plasma     Status: None   Collection Time: 07/05/19 10:11 AM  Result Value Ref Range   Lactic Acid, Venous 0.6 0.5 - 1.9 mmol/L    Comment: Performed at [facility], [address] [place], [place] [address]  Troponin I (High Sensitivity)     Status: Abnormal   Collection Time: 07/05/19 11:01 AM  Result Value Ref Range   Troponin I (High Sensitivity) 29 (H) <18 ng/L    Comment: (NOTE) Elevated high sensitivity troponin I (hsTnI) values and significant  changes across serial measurements may suggest ACS but many other  chronic and acute conditions are known to elevate hsTnI results.  Refer to the "Links" section for chest pain algorithms and additional  guidance. Performed at [facility], [address] [place], [place] [address]   Troponin I (High Sensitivity)     Status: Abnormal   Collection Time: 07/05/19  1:59 PM  Result Value Ref Range   Troponin I (High Sensitivity) 44 (H) <18 ng/L    Comment: (NOTE) Elevated high sensitivity troponin I (hsTnI) values and significant  changes across serial measurements may suggest ACS but many other  chronic and acute conditions are known to elevate hsTnI results.  Refer to the "Links"  section for chest pain algorithms and additional  guidance. Performed at [facility], [address], [place], [place] [address]   Brain natriuretic peptide     Status: Abnormal   Collection Time: 07/05/19  1:59 PM  Result Value Ref Range   B Natriuretic Peptide 321.0 (H) 0.0 - 100.0 pg/mL    Comment: Performed at [facility], [address] [place], [place] [address]  Troponin I (High Sensitivity)     Status: Abnormal   Collection Time: 07/05/19  3:48 PM  Result Value Ref Range   Troponin I (High Sensitivity) 53 (H) <18 ng/L    Comment: (NOTE) Elevated high sensitivity troponin I (hsTnI) values and significant  changes across serial measurements may suggest ACS but many other  chronic and acute conditions are known to elevate hsTnI results.  Refer to the "Links" section for chest pain algorithms and additional  guidance. Performed at [facility], [address] [place], [place] [address]   Troponin I (High Sensitivity)     Status: Abnormal   Collection Time: 07/05/19  5:53 PM  Result Value Ref Range   Troponin I (High Sensitivity) 52 (H) <18 ng/L    Comment: (NOTE) Elevated high sensitivity troponin I (hsTnI) values and significant  changes across serial measurements may suggest ACS but many other  chronic and acute conditions are known to elevate hsTnI results.  Refer to the "Links" section for chest pain algorithms and additional  guidance. Performed at [facility], [address], [place], [place] [address]   Troponin I (High Sensitivity)     Status: Abnormal   Collection Time: 07/05/19  7:55 PM  Result Value Ref Range   Troponin I (High Sensitivity) 50 (H) <18 ng/L    Comment: (NOTE) Elevated high sensitivity troponin I (hsTnI) values and significant  changes across serial measurements may suggest ACS but many other  chronic and acute conditions are known to elevate hsTnI results.  Refer to the "Links" section for chest pain algorithms and additional   guidance. Performed at [facility], [address], [place],  [address]   Basic metabolic panel     Status: Abnormal  Collection Time: 07/05/19  7:55 PM  Result Value Ref Range   Sodium 139 135 - 145 mmol/L   Potassium 3.7 3.5 - 5.1 mmol/L   Chloride 94 (L) 98 - 111 mmol/L   CO2 34 (H) 22 - 32 mmol/L   Glucose, Bld 145 (H) 70 - 99 mg/dL   BUN 116 (H) 8 - 23 mg/dL    Comment: RESULTS CONFIRMED BY MANUAL DILUTION   Creatinine, Ser 4.20 (H) 0.44 - 1.00 mg/dL   Calcium 7.6 (L) 8.9 - 10.3 mg/dL   GFR calc non Af Amer 10 (L) >60 mL/min   GFR calc Af Amer 11 (L) >60 mL/min   Anion gap 11 5 - 15    Comment: Performed at [facility], [address], [place], [place] [address]  Glucose, capillary     Status: Abnormal   Collection Time: 07/05/19  9:11 PM  Result Value Ref Range   Glucose-Capillary 110 (H) 70 - 99 mg/dL  Troponin I (High Sensitivity)     Status: Abnormal   Collection Time: 07/05/19  9:57 PM  Result Value Ref Range   Troponin I (High Sensitivity) 51 (H) <18 ng/L    Comment: (NOTE) Elevated high sensitivity troponin I (hsTnI) values and significant  changes across serial measurements may suggest ACS but many other  chronic and acute conditions are known to elevate hsTnI results.  Refer to the "Links" section for chest pain algorithms and additional  guidance. Performed at [facility], [address] [place], [place] [address]   Basic metabolic panel     Status: Abnormal   Collection Time: 07/06/19  4:47 AM  Result Value Ref Range   Sodium 145 135 - 145 mmol/L   Potassium 3.5 3.5 - 5.1 mmol/L   Chloride 96 (L) 98 - 111 mmol/L   CO2 37 (H) 22 - 32 mmol/L   Glucose, Bld 104 (H) 70 - 99 mg/dL   BUN 120 (H) 8 - 23 mg/dL    Comment: RESULTS CONFIRMED BY MANUAL DILUTION   Creatinine, Ser 3.95 (H) 0.44 - 1.00 mg/dL   Calcium 8.0 (L) 8.9 - 10.3 mg/dL   GFR calc non Af Amer 11 (L) >60 mL/min   GFR calc Af Amer 12 (L) >60 mL/min   Anion gap 12 5 - 15    Comment:  Performed at [facility], [address], [place], [place] [address]  CBC     Status: Abnormal   Collection Time: 07/06/19  4:47 AM  Result Value Ref Range   WBC 11.1 (H) 4.0 - 10.5 K/uL   RBC 3.09 (L) 3.87 - 5.11 MIL/uL   Hemoglobin 8.3 (L) 12.0 - 15.0 g/dL   HCT 27.8 (L) 36.0 - 46.0 %   MCV 90.0 80.0 - 100.0 fL   MCH 26.9 26.0 - 34.0 pg   MCHC 29.9 (L) 30.0 - 36.0 g/dL   RDW 15.4 11.5 - 15.5 %   Platelets 374 150 - 400 K/uL   nRBC 0.0 0.0 - 0.2 %    Comment: Performed at [facility], [address] [place], [place] [address]     ROS:  Pertinent items noted in HPI and remainder of comprehensive ROS otherwise negative.  Physical Exam: Vitals:   07/05/19 2049 07/06/19 0432  BP: (!) 162/59 (!) 133/59  Pulse: (!) 55 96  Resp: 20 20  Temp: 98.6 F (37 C) 98.5 F (36.9 C)  SpO2: 93% 94%     General exam: Appears calm and comfortable  Respiratory system: Bibasal crackles. Respiratory effort normal. No wheezing. Cardiovascular system: S1 & S2 heard, RRR.  Chronic LE lymphedema. Gastrointestinal system: Abdomen is nondistended, soft and nontender. Normal bowel sounds heard. Central nervous system: Alert and oriented. No focal neurological deficits. Extremities: Symmetric 5 x 5 power.  Lymphedema. Skin: No rashes, lesions or ulcers Psychiatry: Judgement and insight appear normal. Mood & affect appropriate.   Assessment/Plan:  #AKI on CKD stage III likely hemodynamically mediated in the setting of intravascular volume depletion.  She has chronic LE lymphedema and very difficult to assess volume status.  She was recently treated with IV diuretics for CHF exacerbation.  Her weight has came down by 5 kg today with a rising BUN 120.  I will hold off on further diuretics and reassess in the morning.  Urinalysis was normal therefore do not think any intrinsic renal disease. -Check urine sodium, potassium.  Order kidney ultrasound to evaluate kidney size echogenicity and rule out  obstruction. -Strict ins and out, daily weight monitor -Monitor kidney function, avoid nephrotoxins.  I have discussed with her that if her kidney function continued to decline or if she becomes uremic then she will need dialysis.  Patient agreed.  #Acute metabolic encephalopathy: Seems like her mental status is improving. The high BUN  may have partly contributed however improvement in mental status does not correlate with that.  I recommend to check ABG to rule out CO2 narcosis.  The head CT scan with chronic changes.  #CHF exacerbation: Received a dose of Lasix. Diuretics as above.  Monitor for now.  #COPD with chronic respiratory failure on home oxygen: Receiving 2 L.  #Atrial fibrillation: On metoprolol, diltiazem and Eliquis.  Monitor heart rate.  #Hypertension: BP acceptable.  Continue current medication.   Tanna Furry 07/06/2019, 9:18 AM  [employer].

## 2019-07-06 NOTE — Plan of Care (Signed)
  Problem: Acute Rehab PT Goals(only PT should resolve) Goal: Pt Will Go Supine/Side To Sit Outcome: Progressing Flowsheets (Taken 07/06/2019 1233) Pt will go Supine/Side to Sit: with modified independence Goal: Patient Will Transfer Sit To/From Stand Outcome: Progressing Flowsheets (Taken 07/06/2019 1233) Patient will transfer sit to/from stand: with modified independence Goal: Pt Will Transfer Bed To Chair/Chair To Bed Outcome: Progressing Flowsheets (Taken 07/06/2019 1233) Pt will Transfer Bed to Chair/Chair to Bed: min guard assist Goal: Pt Will Ambulate Outcome: Progressing Flowsheets (Taken 07/06/2019 1233) Pt will Ambulate:  50 feet  with min guard assist  with minimal assist  with rolling walker   12:34 PM, 07/06/19 Lonell Grandchild, MPT Physical Therapist with [facility] [phone] office [phone] mobile phone

## 2019-07-06 NOTE — Progress Notes (Signed)
Pt voided 359ml of cloudy orange colored, odorous urine. Bladder scan performed per order, post void bladder scan shows >466ml still in bladder x5 scans. Pt denies any feeling of fullness or discomfort. Pt does dribble urine each time she moves, states doesn't feel like she always completely empties her bladder. Information given to oncoming nurse Jinny Blossom, RN regarding possible need for I&O cath.

## 2019-07-06 NOTE — Plan of Care (Signed)
Pt with cloudy orange colored urine with strong odor. Diuretics held per MD order, Renal scan completed. Pt with no BM today.  Problem: Education: Goal: Knowledge of General Education information will improve Description: Including pain rating scale, medication(s)/side effects and non-pharmacologic comfort measures Outcome: Progressing   Problem: Health Behavior/Discharge Planning: Goal: Ability to manage health-related needs will improve Outcome: Progressing   Problem: Clinical Measurements: Goal: Ability to maintain clinical measurements within normal limits will improve Outcome: Progressing Goal: Will remain free from infection Outcome: Progressing Goal: Diagnostic test results will improve Outcome: Progressing Goal: Respiratory complications will improve Outcome: Progressing Goal: Cardiovascular complication will be avoided Outcome: Progressing   Problem: Activity: Goal: Risk for activity intolerance will decrease Outcome: Progressing   Problem: Nutrition: Goal: Adequate nutrition will be maintained Outcome: Progressing   Problem: Coping: Goal: Level of anxiety will decrease Outcome: Progressing   Problem: Elimination: Goal: Will not experience complications related to bowel motility Outcome: Progressing Goal: Will not experience complications related to urinary retention Outcome: Progressing   Problem: Pain Managment: Goal: General experience of comfort will improve Outcome: Progressing   Problem: Safety: Goal: Ability to remain free from injury will improve Outcome: Progressing   Problem: Skin Integrity: Goal: Risk for impaired skin integrity will decrease Outcome: Progressing

## 2019-07-06 NOTE — TOC Initial Note (Signed)
Transition of Care [facility]) - Initial/Assessment Note    Patient Details  Name: [name] MRN: [id number] Date of Birth: 1946/03/20  Transition of Care [facility]) CM/SW Contact:    Ihor Gully, LCSW Phone Number: 07/06/2019, 4:52 PM  Clinical Narrative:                 Patient from [facility] where she is a resident for short term rehab. Admitted for Hypoxia. Son, Amaryllis Dyke at bedside. Patient is unsure as to whether she wants to return to [facility]. [name] states that she is trying to work it out so that within the next three weeks he and his wife can transition patient to living in their home. Discussed that [facility] has Covid in the building. Discussed return to facility vs. Home with [place].  Patient request that [facility] check back with her on tomorrow afternoon to give her time to make a decision.  TOC will follow up with patient to address discharge decision.   Expected Discharge Plan: Skilled Nursing Facility Barriers to Discharge: Continued Medical Work up   Patient Goals and CMS Choice Patient states their goals for this hospitalization and ongoing recovery are:: Patient is unsure as to whether she wants to go home or return to [facility]      Expected Discharge Plan and Services Expected Discharge Plan: [place]       Living arrangements for the past 2 months: [place], [place]                                      Prior Living Arrangements/Services Living arrangements for the past 2 months: [place], [place] Lives with:: Facility Resident Patient language and need for interpreter reviewed:: Yes Do you feel safe going back to the place where you live?: Yes      Need for Family Participation in Patient Care: Yes (Comment) Care giver support system in place?: Yes (comment) Current home services: DME Criminal Activity/Legal Involvement Pertinent to Current Situation/Hospitalization: No - Comment as  needed  Activities of Daily Living Home Assistive Devices/Equipment: Gilford Rile (specify type) ADL Screening (condition at time of admission) Patient's cognitive ability adequate to safely complete daily activities?: Yes Is the patient deaf or have difficulty hearing?: No Does the patient have difficulty seeing, even when wearing glasses/contacts?: No Does the patient have difficulty concentrating, remembering, or making decisions?: No Patient able to express need for assistance with ADLs?: Yes Does the patient have difficulty dressing or bathing?: No Independently performs ADLs?: Yes (appropriate for developmental age) Does the patient have difficulty walking or climbing stairs?: Yes Weakness of Legs: Both Weakness of Arms/Hands: None  Permission Sought/Granted Permission sought to share information with : Family Supports          Permission granted to share info w Relationship: Son, Amaryllis Dyke at bedside     Emotional Assessment Appearance:: Appears stated age   Affect (typically observed): Appropriate Orientation: : Oriented to Self, Oriented to Place, Oriented to  Time, Oriented to Situation Alcohol / Substance Use: Not Applicable Psych Involvement: No (comment)  Admission diagnosis:  Elevated troponin [R77.8] Patient Active Problem List   Diagnosis Date Noted  . AKI (acute kidney injury) ([place]) 07/06/2019  . Hypoxia 07/05/2019  . Weight gain with edema 07/02/2019  . 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 CKD stage 3 due to type 2 diabetes mellitus ([place]) 06/22/2019  . Neuropathy due to type 2 diabetes mellitus ([place]) 06/22/2019  . Dyslipidemia associated with type 2 diabetes mellitus ([place]) 06/22/2019  . Chronic gout 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 06/22/2019  . Quality of life palliative care encounter 06/19/2019  . Atrial fibrillation with RVR ([place])   . Atrial  flutter with rapid ventricular response ([place]) 06/16/2019  . Dyspnea and respiratory abnormalities 06/01/2019  . Acute on chronic respiratory failure with hypoxia and hypercapnia with respiratory acidosis 06/01/2019  . Anasarca 05/31/2019  . Nausea without vomiting 05/31/2019  . Generalized abdominal pain 05/31/2019  . History of colonic polyps 05/31/2019  . Lymphedema of both lower extremities 04/28/2019  . Chronic respiratory failure with hypoxia ([place]) 07/13/2018  . Acute renal failure superimposed on stage 3 chronic kidney disease ([place]) 07/13/2018  . Chronic diastolic CHF (congestive heart failure) ([place]) 07/13/2018  . COPD (chronic obstructive pulmonary disease) ([place]) 07/13/2018  . COPD exacerbation ([place]) 07/12/2018  . Flatulence 09/18/2017  . IBS (irritable bowel syndrome) 04/03/2017  . Gout 03/15/2017  . Acute on chronic diastolic CHF (congestive heart failure) ([place]) 05/08/2016  . CKD (chronic kidney disease) stage 3, GFR 30-59 ml/min 05/08/2016  . COPD with acute exacerbation ([place]) 05/08/2016  . Constipation 04/25/2016  . Peripheral vertigo 06/27/2015  . Port-A-Cath in place 10/06/2014  . Chronic obstructive pulmonary disease ([place]) 04/30/2014  . Obesity, Class III, BMI 40-49.9 (morbid obesity) ([place]) 04/30/2014  . Hemorrhoids, internal, with bleeding 11/10/2013  . Anemia 08/18/2013  . Hiatal hernia with GERD and esophagitis   . Obstructive chronic bronchitis without exacerbation ([place]) 12/26/2009  . Dysphagia 12/26/2009  . DM type 2 with diabetic peripheral neuropathy ([place]) 12/25/2009  . Vitamin B deficiency 12/25/2009  . Iron deficiency anemia due to chronic blood loss 12/25/2009  . Benign essential HTN 12/25/2009  . DEGENERATIVE DISC DISEASE 12/25/2009  . OSA on CPAP 12/25/2009  . HLD (hyperlipidemia) 12/25/2009  . Type 2 diabetes with nephropathy ([place]) 12/25/2009   PCP:  [name], MD Pharmacy:   [facility] - [address] [place] [address] [place] [address] Phone: [phone] Fax: [phone]  [facility] [phone] - [place], [place] - [place] AT [place] [address] [place] [address] Phone: [phone] Fax: [phone]     Social Determinants of Health (SDOH) Interventions    Readmission Risk Interventions Readmission Risk Prevention Plan 06/21/2019 06/18/2019 06/08/2019  Transportation Screening - Complete Complete  PCP or Specialist Appt within 3-5 Days Not Complete - Complete  Not Complete comments Pt goign to SNF - -  [place] or [place] - - Not Complete  HRI or Home Care Consult comments - - patient declining home health  Social Work Consult for [place] Planning/Counseling - Complete Complete  Palliative Care Screening - Not Applicable Not Applicable  Medication Review (RN Care Manager) - Complete Complete  Some recent data might be hidden

## 2019-07-07 ENCOUNTER — Telehealth: Payer: Self-pay | Admitting: Emergency Medicine

## 2019-07-07 DIAGNOSIS — R778 Other specified abnormalities of plasma proteins: Secondary | ICD-10-CM

## 2019-07-07 DIAGNOSIS — R339 Retention of urine, unspecified: Secondary | ICD-10-CM | POA: Diagnosis present

## 2019-07-07 LAB — GLUCOSE, CAPILLARY
Glucose-Capillary: 106 mg/dL — ABNORMAL HIGH (ref 70–99)
Glucose-Capillary: 106 mg/dL — ABNORMAL HIGH (ref 70–99)
Glucose-Capillary: 116 mg/dL — ABNORMAL HIGH (ref 70–99)
Glucose-Capillary: 127 mg/dL — ABNORMAL HIGH (ref 70–99)
Glucose-Capillary: 150 mg/dL — ABNORMAL HIGH (ref 70–99)

## 2019-07-07 LAB — RENAL FUNCTION PANEL
Albumin: 2.4 g/dL — ABNORMAL LOW (ref 3.5–5.0)
Anion gap: 12 (ref 5–15)
BUN: 104 mg/dL — ABNORMAL HIGH (ref 8–23)
CO2: 36 mmol/L — ABNORMAL HIGH (ref 22–32)
Calcium: 8.1 mg/dL — ABNORMAL LOW (ref 8.9–10.3)
Chloride: 95 mmol/L — ABNORMAL LOW (ref 98–111)
Creatinine, Ser: 3.12 mg/dL — ABNORMAL HIGH (ref 0.44–1.00)
GFR calc Af Amer: 16 mL/min — ABNORMAL LOW (ref >60)
GFR calc non Af Amer: 14 mL/min — ABNORMAL LOW (ref >60)
Glucose, Bld: 112 mg/dL — ABNORMAL HIGH (ref 70–99)
Phosphorus: 2.9 mg/dL (ref 2.5–4.6)
Potassium: 3.5 mmol/L (ref 3.5–5.1)
Sodium: 143 mmol/L (ref 135–145)

## 2019-07-07 LAB — CBC
HCT: 29.3 % — ABNORMAL LOW (ref 36.0–46.0)
Hemoglobin: 8.7 g/dL — ABNORMAL LOW (ref 12.0–15.0)
MCH: 26.5 pg (ref 26.0–34.0)
MCHC: 29.7 g/dL — ABNORMAL LOW (ref 30.0–36.0)
MCV: 89.3 fL (ref 80.0–100.0)
Platelets: 379 10*3/uL (ref 150–400)
RBC: 3.28 MIL/uL — ABNORMAL LOW (ref 3.87–5.11)
RDW: 15.3 % (ref 11.5–15.5)
WBC: 11.5 10*3/uL — ABNORMAL HIGH (ref 4.0–10.5)
nRBC: 0.2 % (ref 0.0–0.2)

## 2019-07-07 LAB — HEMOGLOBIN A1C
Hgb A1c MFr Bld: 6.8 % — ABNORMAL HIGH (ref 4.8–5.6)
Mean Plasma Glucose: 148 mg/dL

## 2019-07-07 MED ORDER — FOSFOMYCIN TROMETHAMINE 3 G PO PACK
3.0000 g | PACK | Freq: Once | ORAL | Status: AC
  Administered 2019-07-07: 3 g via ORAL
  Filled 2019-07-07: qty 3

## 2019-07-07 MED ORDER — TAMSULOSIN HCL 0.4 MG PO CAPS
0.4000 mg | ORAL_CAPSULE | Freq: Every day | ORAL | Status: DC
  Administered 2019-07-07 – 2019-07-08 (×2): 0.4 mg via ORAL
  Filled 2019-07-07 (×2): qty 1

## 2019-07-07 MED ORDER — TORSEMIDE 20 MG PO TABS
50.0000 mg | ORAL_TABLET | Freq: Every day | ORAL | Status: DC
  Administered 2019-07-07 – 2019-07-08 (×2): 50 mg via ORAL
  Filled 2019-07-07 (×3): qty 3

## 2019-07-07 NOTE — TOC Progression Note (Signed)
Transition of Care [facility]) - Progression Note    Patient Details  Name: [name] MRN: [id number] Date of Birth: 09-22-1945  Transition of Care [facility]) CM/SW Contact  Shade Flood, LCSW Phone Number: 07/07/2019, 1:11 PM  Clinical Narrative:     TOC following. Met with pt today to follow up on dc planning discussions. At this time, pt states she is planning to return to [facility] to continue her rehab and then return home after that. Spoke with pt's son, [name], by phone and he is agreeable to this plan.  Per MD, earliest anticipated dc is tomorrow. Left message to update Tami at [facility].   Expected Discharge Plan: Skilled Nursing Facility Barriers to Discharge: Continued Medical Work up  Expected Discharge Plan and Services Expected Discharge Plan: [place] arrangements for the past 2 months: [place], [place]                                       Social Determinants of Health (SDOH) Interventions    Readmission Risk Interventions Readmission Risk Prevention Plan 07/07/2019 06/21/2019 06/18/2019  Transportation Screening Complete - Complete  PCP or Specialist Appt within 3-5 Days - Not Complete -  Not Complete comments - Pt goign to SNF -  [place] or [place] or Home Care Consult comments - - -  Social Work Consult for [place] Planning/Counseling - - Complete  Palliative Care Screening - - Not Applicable  Medication Review Press photographer) Complete - Complete  SW Recovery Care/Counseling Consult Complete - -  Palliative Care Screening Not Applicable - -  Skilled Nursing Facility Complete - -  Some recent data might be hidden

## 2019-07-07 NOTE — Progress Notes (Addendum)
PROGRESS NOTE [facility]   SUMIE REMSEN  OVZ:[id number]  DOB: 1945/08/06  DOA: 07/05/2019 PCP: [name], MD   Brief Admission Hx: 73 y.o. female with medical history significant for diastolic CHF, CKD 3, OSA on CPAP, COPD, atrial fibrillation, coronary artery disease, diabetes mellitus and hypertension. Patient was brought to the ED from the [facility] for reports of altered mental status.  MDM/Assessment & Plan:   1. Mild acute CHF exacerbation - Pt has responded well to IV lasix and has diuresed since admission.  Following weights and I/Os closely.  Pt started back on lower dose torsemide per nephrology.     2. Paroxysmal atrial fibrillation -her heart rate remains well controlled on current medications which we will continue and she is fully anticoagulated with apixaban.  Continue metoprolol and Cardizem. 3. Altered mentation-RESOLVED.  This seems to be intermittent however she seems to be back to her baseline this morning.  I think her worsening CKD likely contributed to her altered mentation in addition to UTI.  We have asked for nephrology consultation for assistance. 4. Enterobacter UTI -  Discussed with pharmD will treat with monurol.  5. Type 2 diabetes mellitus-resume home Lantus and's SSI coverage and CBG monitoring. 6. Stable COPD-continue home bronchodilators. 7. Stage IIIb CKD-creatinine slightly improved, nephrology consult appreciated, she has been restarted on torsemide lower dose by nephrology and plan to recheck labs in a.m.  Renal ultrasound reviewed.   8. Essential hypertension-blood pressures well controlled on current home medications which have been continued. 9. Urinary retention - she is not completely emptying bladder, will do trial of flomax to see if it helps and monitor.  DVT prophylaxis: Apixaban Code Status: Full Family Communication: Patient updated at bedside, son by telephone Disposition Plan: Continue inpatient care, anticipate  returning to SNF at discharge, however patient saying she may elect to go home with [facility] services.   Consultants:  Nephrology  Procedures:    Antimicrobials:     Subjective: Patient says she is starting to feel better.   Objective: Vitals:   07/07/19 0434 07/07/19 0438 07/07/19 0827 07/07/19 0829  BP:  (!) 145/55    Pulse:  96    Resp:  18    Temp:  98.5 F (36.9 C)    TempSrc:  Oral    SpO2:  92% 93% 93%  Weight: 90.4 kg     Height:        Intake/Output Summary (Last 24 hours) at 07/07/2019 1134 Last data filed at 07/07/2019 0756 Gross per 24 hour  Intake --  Output 950 ml  Net -950 ml   Filed Weights   07/05/19 0911 07/06/19 0300 07/07/19 0434  Weight: 94.5 kg 90.8 kg 90.4 kg   REVIEW OF SYSTEMS  As per history otherwise all reviewed and reported negative  Exam:  General exam: Chronically ill-appearing female she is mostly bedbound in the bed in no apparent distress cooperative and oriented x3.  Respiratory system: bibasilar crackles. No increased work of breathing. Cardiovascular system: S1 & S2 heard. No JVD, murmurs, gallops, clicks or pedal edema. Gastrointestinal system: Abdomen is nondistended, soft and nontender. Normal bowel sounds heard. Central nervous system: Alert and oriented. No focal neurological deficits. Extremities: chronic lymphedema BLEs.   Data Reviewed: Basic Metabolic Panel: Recent Labs  Lab 07/04/19 1132 07/05/19 1955 07/06/19 0447 07/07/19 0448  NA 142 139 145 143  K 3.9 3.7 3.5 3.5  CL 93* 94* 96* 95*  CO2 35* 34* 37* 36*  GLUCOSE 126* 145* 104* 112*  BUN 116* 116* 120* 104*  CREATININE 3.31* 4.20* 3.95* 3.12*  CALCIUM 8.1* 7.6* 8.0* 8.1*  PHOS  --   --   --  2.9   Liver Function Tests: Recent Labs  Lab 07/04/19 1132 07/07/19 0448  AST 18  --   ALT 11  --   ALKPHOS 70  --   BILITOT 1.1  --   PROT 7.0  --   ALBUMIN 2.7* 2.4*   No results for input(s): LIPASE, AMYLASE in the last 168 hours. No results for  input(s): AMMONIA in the last 168 hours. CBC: Recent Labs  Lab 07/04/19 1132 07/05/19 1011 07/06/19 0447 07/07/19 0448  WBC 9.9 9.3 11.1* 11.5*  NEUTROABS 7.9* 7.1  --   --   HGB 10.0* 8.9* 8.3* 8.7*  HCT 33.7* 29.9* 27.8* 29.3*  MCV 91.6 90.3 90.0 89.3  PLT 355 333 374 379   Cardiac Enzymes: No results for input(s): CKTOTAL, CKMB, CKMBINDEX, TROPONINI in the last 168 hours. CBG (last 3)  Recent Labs    07/07/19 0435 07/07/19 0745 07/07/19 1130  GLUCAP 106* 106* 116*   Recent Results (from the past 240 hour(s))  Culture, blood (Routine X 2) w Reflex to ID Panel     Status: Abnormal   Collection Time: 07/01/19 12:55 PM   Specimen: BLOOD  Result Value Ref Range Status   Specimen Description   Final    BLOOD PORTA CATH Performed at [facility], [address], [place], [place] [address]    Special Requests   Final    BOTTLES DRAWN AEROBIC AND ANAEROBIC Blood Culture adequate volume Performed at [facility], [address], [place], [place] [address]    Culture  Setup Time   Final    GRAM POSITIVE COCCI AEROBIC BOTTLE ONLY Gram Stain Report Called to,Read Back By and Verified With: TEJEDA @ [id number] ON [id number] BY HENDERSON L. Performed at [facility], [address] [place], [place] [address]    Culture (A)  Final    STAPHYLOCOCCUS SPECIES (COAGULASE NEGATIVE) THE SIGNIFICANCE OF ISOLATING THIS ORGANISM FROM A SINGLE VENIPUNCTURE CANNOT BE PREDICTED WITHOUT FURTHER CLINICAL AND CULTURE CORRELATION. SUSCEPTIBILITIES AVAILABLE ONLY ON REQUEST. Performed at [facility], [place] [address], [place], [place] [address]    Report Status 07/04/2019 FINAL  Final  Urine culture     Status: Abnormal   Collection Time: 07/04/19 10:59 AM   Specimen: Urine, Random  Result Value Ref Range Status   Specimen Description   Final    URINE, RANDOM Performed at [facility], [address], [place], [place] [address]    Special Requests   Final    NONE Performed at [facility], [address] [place],  [address]    Culture >=100,000 COLONIES/mL ENTEROBACTER AEROGENES (A)  Final   Report Status 07/06/2019 FINAL  Final   Organism ID, Bacteria ENTEROBACTER AEROGENES (A)  Final      Susceptibility   Enterobacter aerogenes - MIC*    CEFAZOLIN >=64 RESISTANT Resistant     CEFTRIAXONE 16 INTERMEDIATE Intermediate     CIPROFLOXACIN <=0.25 SENSITIVE Sensitive     GENTAMICIN <=1 SENSITIVE Sensitive     IMIPENEM 1 SENSITIVE Sensitive     NITROFURANTOIN 64 INTERMEDIATE Intermediate     TRIMETH/SULFA <=20 SENSITIVE Sensitive     PIP/TAZO >=128 RESISTANT Resistant     * >=100,000 COLONIES/mL ENTEROBACTER AEROGENES  SARS CORONAVIRUS 2 (TAT 6-24 HRS) Nasopharyngeal Nasopharyngeal Swab  Status: None   Collection Time: 07/04/19 11:17 AM   Specimen: Nasopharyngeal Swab  Result Value Ref Range Status   SARS Coronavirus 2 NEGATIVE NEGATIVE Final    Comment: (NOTE) SARS-CoV-2 target nucleic acids are NOT DETECTED. The SARS-CoV-2 RNA is generally detectable in upper and lower respiratory specimens during the acute phase of infection. Negative results do not preclude SARS-CoV-2 infection, do not rule out co-infections with other pathogens, and should not be used as the sole basis for treatment or other patient management decisions. Negative results must be combined with clinical observations, patient history, and epidemiological information. The expected result is Negative. Fact Sheet for Patients: SugarRoll.be Fact Sheet for Healthcare Providers: https://www.woods-mathews.com/ This test is not yet approved or cleared by the [place] FDA and  has been authorized for detection and/or diagnosis of SARS-CoV-2 by FDA under an Emergency Use Authorization (EUA). This EUA will remain  in effect (meaning this test can be used) for the duration of the COVID-19 declaration under Section 56 4(b)(1) of the Act, 21  U.S.C. section 360bbb-3(b)(1), unless the authorization is terminated or revoked sooner. Performed at [facility], [place] [address], [place], [place] [address]   Culture, blood (routine x 2)     Status: None (Preliminary result)   Collection Time: 07/04/19 11:32 AM   Specimen: Right Antecubital; Blood  Result Value Ref Range Status   Specimen Description RIGHT ANTECUBITAL BOTTLES DRAWN AEROBIC ONLY  Final   Special Requests Blood Culture adequate volume  Final   Culture   Final    NO GROWTH 3 DAYS Performed at [facility], [address] [place], [place] [address]    Report Status PENDING  Incomplete  Culture, blood (routine x 2)     Status: None (Preliminary result)   Collection Time: 07/04/19 11:33 AM   Specimen: Left Antecubital; Blood  Result Value Ref Range Status   Specimen Description LEFT ANTECUBITAL BOTTLES DRAWN AEROBIC ONLY  Final   Special Requests   Final    Blood Culture results may not be optimal due to an inadequate volume of blood received in culture bottles   Culture   Final    NO GROWTH 3 DAYS Performed at [facility], [address] [place], [place] [address]    Report Status PENDING  Incomplete     Studies: US Renal  Result Date: 07/06/2019 CLINICAL DATA:  Acute kidney injury. EXAM: RENAL / URINARY TRACT ULTRASOUND COMPLETE COMPARISON:  Noncontrast abdominal CT 05/31/2019, renal ultrasound 09/10/2017 FINDINGS: Right Kidney: Renal measurements: 8.9 x 5.6 x 6.1 cm = volume: 160 mL. No hydronephrosis. There is renal cortical thinning with increased parenchymal echogenicity. Mild scarring. Simple cyst measuring 1.2 cm in the upper kidney again seen. No suspicious solid lesion. No shadowing calculi. Left Kidney: Renal measurements: 9.0 x 5.5 x 5.0 cm = volume: 132 mL. No hydronephrosis. There is thinning of renal parenchyma with increased renal echogenicity. Bladder: No bladder wall thickening. Both ureteral jets are visualized. Reported post void residual of  284 cc. Prevoid bladder volume not recorded. Other: None. IMPRESSION: 1. No hydronephrosis. 2. Thinning of bilateral renal parenchyma with increased renal echogenicity most consistent with chronic medical renal disease. 3. Post void residual of 284 cc. Electronically Signed   By: Keith Rake M.D.   On: 07/06/2019 12:45   Scheduled Meds:  allopurinol  100 mg Oral BID   apixaban  5 mg Oral BID   aspirin EC  81 mg Oral Daily   atorvastatin  40 mg Oral QHS   Chlorhexidine Gluconate Cloth  6 each Topical Daily   diltiazem  120 mg Oral Daily   feeding supplement (GLUCERNA SHAKE)  237 mL Oral BID BM   fluticasone furoate-vilanterol  1 puff Inhalation Daily   And   umeclidinium bromide  1 puff Inhalation Daily   hydrALAZINE  25 mg Oral BID   insulin aspart  0-5 Units Subcutaneous QHS   insulin aspart  0-9 Units Subcutaneous TID WC   insulin glargine  8 Units Subcutaneous QHS   metoprolol succinate  75 mg Oral Daily   mirtazapine  7.5 mg Oral QHS   pantoprazole  40 mg Oral Daily   torsemide  50 mg Oral Daily   Continuous Infusions:  Active Problems:   Hypoxia   AKI (acute kidney injury) ([place])  Time spent:   Irwin Brakeman, MD [facility] 07/07/2019, 11:34 AM    LOS: 1 day  How to contact the [facility] Attending or Consulting provider Early or covering provider during after hours [place], for this patient?  1. Check the care team in [facility] and look for a) attending/consulting TRH provider listed and b) the [facility] team listed 2. Log into [email or web address] and use [place]'s universal password to access. If you do not have the password, please contact the hospital operator. 3. Locate the [facility] provider you are looking for under [facility] and page to a number that you can be directly reached. 4. If you still have difficulty reaching the provider, please page the [facility] (Director on Call) for the Hospitalists listed on amion for assistance.

## 2019-07-07 NOTE — Telephone Encounter (Signed)
Post ED Visit - Positive Culture Follow-up  Culture report reviewed by antimicrobial stewardship pharmacist: [place] Team []  Elenor Quinones, Pharm.D. []  Heide Guile, Pharm.D., BCPS AQ-ID []  Parks Neptune, Pharm.D., BCPS []  Alycia Rossetti, Pharm.D., BCPS []  [place], [place].D., BCPS, AAHIVP []  Legrand Como, Pharm.D., BCPS, AAHIVP []  Salome Arnt, PharmD, BCPS []  Johnnette Gourd, PharmD, BCPS [x]  [place] Better, PharmD, BCPS []  Leeroy Cha, PharmD []  Laqueta Linden, PharmD, BCPS []  Albertina Parr, PharmD  [place] Team []  Leodis Sias, PharmD []  Lindell Spar, PharmD []  Royetta Asal, PharmD []  Graylin Shiver, Rph []  Rema Fendt) Glennon Mac, PharmD []  Arlyn Dunning, PharmD []  Netta Cedars, PharmD []  Dia Sitter, PharmD []  Leone Haven, PharmD []  Gretta Arab, PharmD []  Theodis Shove, PharmD []  Peggyann Juba, PharmD []  Reuel Boom, PharmD   Positive urine culture Treated with none, patient is currently a inpatiet no further patient follow-up is required at this time.  Hazle Nordmann 07/07/2019, 1:10 PM

## 2019-07-07 NOTE — Progress Notes (Addendum)
[facility] NEPHROLOGY PROGRESS NOTE  Assessment/ Plan: Pt is a 73 y.o. yo female  with HTN, DM, obesity, OSA on CPAP, COPD, A. fib, CAD, CHF with preserved EF, CKD with baseline creatinine level around 2, presented from SNF with altered mental status and shortness of breath seen as a consultation at the request of Dr. Wynetta Emery for AKI on CKD.  #AKI on CKD stage III likely hemodynamically mediated in the setting of intravascular volume depletion.  She has chronic LE lymphedema and very difficult to assess volume status.  She was recently treated with IV diuretics for CHF exacerbation.  Her weight has came down by 4 kg.  -Some issue with urinary retention yesterday therefore continue bladder scan.  She has external urinary catheter.  Urine output of 1375 cc.  Held diuretics yesterday.  The BUN and creatinine level trending down.  I will resume oral torsemide half of her home dose today.  Repeat renal panel in the morning. -Urinalysis was normal therefore do not think any intrinsic renal disease.  Kidney ultrasound consistent with chronic finding.  No obstruction. -Strict ins and out, daily weight monitor. -No need for dialysis.  Hope for continued renal recovery.  #Acute metabolic encephalopathy:  The head CT scan with chronic changes.  Mental status improved.  #CHF exacerbation: Resuming oral torsemide today.  #COPD with chronic respiratory failure on home oxygen: Receiving 2 L.  #Atrial fibrillation: On metoprolol, diltiazem and Eliquis.  Monitor heart rate.  #Hypertension: BP acceptable.  Continue current medication.   Subjective: Seen and examined at bedside.  Overnight event noted.  Continue bladder scan.  She is alert awake and following commands.  Denies chest pain, shortness of breath, nausea vomiting.    Objective Vital signs in last 24 hours: Vitals:   07/07/19 0434 07/07/19 0438 07/07/19 0827 07/07/19 0829  BP:  (!) 145/55    Pulse:  96    Resp:  18     Temp:  98.5 F (36.9 C)    TempSrc:  Oral    SpO2:  92% 93% 93%  Weight: 90.4 kg     Height:       Weight change: -4.1 kg  Intake/Output Summary (Last 24 hours) at 07/07/2019 0906 Last data filed at 07/07/2019 0756 Gross per 24 hour  Intake -  Output 1275 ml  Net -1275 ml       Labs: Basic Metabolic Panel: Recent Labs  Lab 07/05/19 1955 07/06/19 0447 07/07/19 0448  NA 139 145 143  K 3.7 3.5 3.5  CL 94* 96* 95*  CO2 34* 37* 36*  GLUCOSE 145* 104* 112*  BUN 116* 120* 104*  CREATININE 4.20* 3.95* 3.12*  CALCIUM 7.6* 8.0* 8.1*  PHOS  --   --  2.9   Liver Function Tests: Recent Labs  Lab 07/04/19 1132 07/07/19 0448  AST 18  --   ALT 11  --   ALKPHOS 70  --   BILITOT 1.1  --   PROT 7.0  --   ALBUMIN 2.7* 2.4*   No results for input(s): LIPASE, AMYLASE in the last 168 hours. No results for input(s): AMMONIA in the last 168 hours. CBC: Recent Labs  Lab 06/30/19 1105 07/04/19 1132 07/05/19 1011 07/06/19 0447 07/07/19 0448  WBC 13.6* 9.9 9.3 11.1* 11.5*  NEUTROABS  --  7.9* 7.1  --   --   HGB 9.0* 10.0* 8.9* 8.3* 8.7*  HCT 29.2* 33.7* 29.9* 27.8* 29.3*  MCV 89.0 91.6 90.3 90.0 89.3  PLT 275 355 333 374 379   Cardiac Enzymes: No results for input(s): CKTOTAL, CKMB, CKMBINDEX, TROPONINI in the last 168 hours. CBG: Recent Labs  Lab 07/06/19 1138 07/06/19 1709 07/06/19 2029 07/07/19 0435 07/07/19 0745  GLUCAP 112* 101* 125* 106* 106*    Iron Studies: No results for input(s): IRON, TIBC, TRANSFERRIN, FERRITIN in the last 72 hours. Studies/Results: Dg Chest 2 View  Result Date: 07/05/2019 CLINICAL DATA:  Congestion and shortness of breath EXAM: CHEST - 2 VIEW COMPARISON:  Chest radiograph July 04, 2019 FINDINGS: Port-A-Cath tip is in the superior vena cava. No pneumothorax. There is mild interstitial prominence with suspected mild interstitial edema. No consolidation. Heart is mildly enlarged with pulmonary venous hypertension. No evident  adenopathy. 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 There is degenerative change in the thoracic spine. IMPRESSION: There is a degree of pulmonary vascular congestion with mild interstitial edema. No consolidation. There may be a degree of congestive heart failure. Sizable paraesophageal hernia noted. Port-A-Cath tip in superior vena cava. Electronically Signed   By: Lowella Grip III M.D.   On: 07/05/2019 10:52   Ct Head Wo Contrast  Result Date: 07/05/2019 CLINICAL DATA:  Unexplained altered level of consciousness. EXAM: CT HEAD WITHOUT CONTRAST TECHNIQUE: Contiguous axial images were obtained from the base of the skull through the vertex without intravenous contrast. COMPARISON:  06/27/2015 FINDINGS: Brain: Age related atrophy. Mild chronic small-vessel change of the hemispheric white matter and pons. 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 No sign of mass lesion, hemorrhage, hydrocephalus or extra-axial collection. Vascular: There is atherosclerotic calcification of the major vessels at the base of the brain. Skull: Negative Sinuses/Orbits: Paranasal sinuses are clear. Orbits are negative. Mastoid effusion on the right. Other: None IMPRESSION: No acute intracranial finding. Atrophy and chronic small-vessel ischemic changes as seen previously. Electronically Signed   By: Nelson Chimes M.D.   On: 07/05/2019 11:04   US Renal  Result Date: 07/06/2019 CLINICAL DATA:  Acute kidney injury. EXAM: RENAL / URINARY TRACT ULTRASOUND COMPLETE COMPARISON:  Noncontrast abdominal CT 05/31/2019, renal ultrasound 09/10/2017 FINDINGS: Right Kidney: Renal measurements: 8.9 x 5.6 x 6.1 cm = volume: 160 mL. No hydronephrosis. There is renal cortical thinning with increased parenchymal echogenicity. Mild scarring. Simple cyst measuring 1.2 cm in the upper kidney again seen. No suspicious solid lesion. No shadowing calculi. Left Kidney: Renal measurements: 9.0 x 5.5 x 5.0 cm = volume: 132 mL. No  hydronephrosis. There is thinning of renal parenchyma with increased renal echogenicity. Bladder: No bladder wall thickening. Both ureteral jets are visualized. Reported post void residual of 284 cc. Prevoid bladder volume not recorded. Other: None. IMPRESSION: 1. No hydronephrosis. 2. Thinning of bilateral renal parenchyma with increased renal echogenicity most consistent with chronic medical renal disease. 3. Post void residual of 284 cc. Electronically Signed   By: Keith Rake M.D.   On: 07/06/2019 12:45    Medications: Infusions:   Scheduled Medications: . allopurinol  100 mg Oral BID  . apixaban  5 mg Oral BID  . aspirin EC  81 mg Oral Daily  . atorvastatin  40 mg Oral QHS  . Chlorhexidine Gluconate Cloth  6 each Topical Daily  . diltiazem  120 mg Oral Daily  . feeding supplement (GLUCERNA SHAKE)  237 mL Oral BID BM  . fluticasone furoate-vilanterol  1 puff Inhalation Daily   And  . umeclidinium bromide  1 puff Inhalation Daily  . hydrALAZINE  25 mg Oral BID  .  insulin aspart  0-5 Units Subcutaneous QHS  . insulin aspart  0-9 Units Subcutaneous TID WC  . insulin glargine  8 Units Subcutaneous QHS  . metoprolol succinate  75 mg Oral Daily  . mirtazapine  7.5 mg Oral QHS  . pantoprazole  40 mg Oral Daily  . torsemide  50 mg Oral Daily    have reviewed scheduled and prn medications.  Physical Exam: General:NAD, comfortable Heart:RRR, s1s2 nl Lungs: Clear b/l, no crackle Abdomen:soft, Non-tender, non-distended Extremities: Trace lower extremity edema Neurology: Alert, awake and following commands, nonfocal Skin: No rash or ulcer.  Dron Prasad Bhandari 07/07/2019,9:06 AM  LOS: 1 day  Pager: [phone]

## 2019-07-08 ENCOUNTER — Inpatient Hospital Stay
Admission: RE | Admit: 2019-07-08 | Discharge: 2019-07-17 | Disposition: A | Discharge status: Nursing Home (Includes ICF) | Payer: Medicare Other | Source: Ambulatory Visit | Attending: Internal Medicine | Admitting: Internal Medicine

## 2019-07-08 DIAGNOSIS — J449 Chronic obstructive pulmonary disease, unspecified: Secondary | ICD-10-CM

## 2019-07-08 DIAGNOSIS — G4733 Obstructive sleep apnea (adult) (pediatric): Secondary | ICD-10-CM

## 2019-07-08 DIAGNOSIS — N183 Chronic kidney disease, stage 3 unspecified: Secondary | ICD-10-CM

## 2019-07-08 DIAGNOSIS — J441 Chronic obstructive pulmonary disease with (acute) exacerbation: Secondary | ICD-10-CM

## 2019-07-08 DIAGNOSIS — R778 Other specified abnormalities of plasma proteins: Secondary | ICD-10-CM

## 2019-07-08 DIAGNOSIS — E1122 Type 2 diabetes mellitus with diabetic chronic kidney disease: Secondary | ICD-10-CM

## 2019-07-08 DIAGNOSIS — Z9989 Dependence on other enabling machines and devices: Secondary | ICD-10-CM

## 2019-07-08 DIAGNOSIS — R339 Retention of urine, unspecified: Secondary | ICD-10-CM

## 2019-07-08 DIAGNOSIS — E1142 Type 2 diabetes mellitus with diabetic polyneuropathy: Secondary | ICD-10-CM

## 2019-07-08 LAB — GLUCOSE, CAPILLARY
Glucose-Capillary: 134 mg/dL — ABNORMAL HIGH (ref 70–99)
Glucose-Capillary: 111 mg/dL — ABNORMAL HIGH (ref 70–99)
Glucose-Capillary: 115 mg/dL — ABNORMAL HIGH (ref 70–99)
Glucose-Capillary: 104 mg/dL — ABNORMAL HIGH (ref 70–99)

## 2019-07-08 LAB — RENAL FUNCTION PANEL
Albumin: 2.4 g/dL — ABNORMAL LOW (ref 3.5–5.0)
Anion gap: 13 (ref 5–15)
BUN: 105 mg/dL — ABNORMAL HIGH (ref 8–23)
CO2: 36 mmol/L — ABNORMAL HIGH (ref 22–32)
Calcium: 8.1 mg/dL — ABNORMAL LOW (ref 8.9–10.3)
Chloride: 94 mmol/L — ABNORMAL LOW (ref 98–111)
Creatinine, Ser: 2.87 mg/dL — ABNORMAL HIGH (ref 0.44–1.00)
GFR calc Af Amer: 18 mL/min — ABNORMAL LOW (ref >60)
GFR calc non Af Amer: 16 mL/min — ABNORMAL LOW (ref >60)
Glucose, Bld: 125 mg/dL — ABNORMAL HIGH (ref 70–99)
Phosphorus: 2.7 mg/dL (ref 2.5–4.6)
Potassium: 4.2 mmol/L (ref 3.5–5.1)
Sodium: 143 mmol/L (ref 135–145)

## 2019-07-08 MED ORDER — HEPARIN SOD (PORK) LOCK FLUSH 100 UNIT/ML IV SOLN: 500.0000 [IU] | INTRAVENOUS | Status: DC | PRN

## 2019-07-08 MED ORDER — TAMSULOSIN HCL 0.4 MG PO CAPS: 0.4000 mg | ORAL_CAPSULE | Freq: Every day | ORAL | Status: DC

## 2019-07-08 MED ORDER — HEPARIN SOD (PORK) LOCK FLUSH 100 UNIT/ML IV SOLN
500.0000 [IU] | INTRAVENOUS | Status: DC | PRN
  Filled 2019-07-08: qty 5

## 2019-07-08 MED ORDER — HEPARIN SOD (PORK) LOCK FLUSH 100 UNIT/ML IV SOLN
500.0000 [IU] | INTRAVENOUS | Status: DC
  Administered 2019-07-08: 500 [IU]

## 2019-07-08 MED ORDER — TORSEMIDE 10 MG PO TABS: 50.0000 mg | ORAL_TABLET | Freq: Every day | ORAL | Status: DC

## 2019-07-08 MED ORDER — POTASSIUM CHLORIDE CRYS ER 10 MEQ PO TBCR: 10.0000 meq | EXTENDED_RELEASE_TABLET | ORAL | Status: DC

## 2019-07-08 NOTE — Progress Notes (Signed)
Report called to Ferd Glassing, RN at [facility]. Telemetry and IV removed. Patient aware of transport. [facility] staff to transport. Will continue to monitor.

## 2019-07-08 NOTE — Progress Notes (Signed)
[name] NEPHROLOGY PROGRESS NOTE  Assessment/ Plan: Pt is a 73 y.o. yo female  with HTN, DM, obesity, OSA on CPAP, COPD, A. fib, CAD, CHF with preserved EF, CKD with baseline creatinine level around 2, presented from SNF with altered mental status and shortness of breath seen as a consultation at the request of Dr. Wynetta Emery for AKI on CKD.  #AKI on CKD stage III likely hemodynamically mediated in the setting of intravascular volume depletion.  She has chronic LE lymphedema and very difficult to assess volume status.  She was recently treated with IV diuretics for CHF exacerbation.  Her weight has came down by 4 kg.  - adequate urine output, continue bladder scans with I/O cath as needed- would continue this on d/c as well along with the trial of Flomax - BUN and Cr coming down - 1/2 home dose torsemide started yesterday, steady weights - would continue torsemide 50 mg daily, continue daily weights, and uptitrate as needed to home dose of 100 mg daily - BUN and Cr improved- Ok to go from renal perspective- she will need CBC and BMP within a week to make sure she continues to improve.    #Acute metabolic encephalopathy:  Back to baseline  #CHF exacerbation: as above  #COPD with chronic respiratory failure on home oxygen: Receiving 2 L.  #Atrial fibrillation: On metoprolol, diltiazem and Eliquis.  Monitor heart rate.  #Hypertension: BP acceptable.  Continue current medication.   Subjective: Seen and examined.  Sleeping, easily arousable.    Continue bladder scans.  Cr and BUN coming down.    Objective Vital signs in last 24 hours: Vitals:   07/08/19 0446 07/08/19 0500 07/08/19 0807 07/08/19 0808  BP: 108/67     Pulse: 91     Resp: 18     Temp: 98.3 F (36.8 C)     TempSrc:      SpO2: 95%  95% 95%  Weight:  90.1 kg    Height:       Weight change: -0.3 kg  Intake/Output Summary (Last 24 hours) at 07/08/2019 1129 Last data filed at 07/08/2019 0700 Gross per  24 hour  Intake 600 ml  Output 2450 ml  Net -1850 ml       Labs: Basic Metabolic Panel: Recent Labs  Lab 07/06/19 0447 07/07/19 0448 07/08/19 0502  NA 145 143 143  K 3.5 3.5 4.2  CL 96* 95* 94*  CO2 37* 36* 36*  GLUCOSE 104* 112* 125*  BUN 120* 104* 105*  CREATININE 3.95* 3.12* 2.87*  CALCIUM 8.0* 8.1* 8.1*  PHOS  --  2.9 2.7   Liver Function Tests: Recent Labs  Lab 07/04/19 1132 07/07/19 0448 07/08/19 0502  AST 18  --   --   ALT 11  --   --   ALKPHOS 70  --   --   BILITOT 1.1  --   --   PROT 7.0  --   --   ALBUMIN 2.7* 2.4* 2.4*   No results for input(s): LIPASE, AMYLASE in the last 168 hours. No results for input(s): AMMONIA in the last 168 hours. CBC: Recent Labs  Lab 07/04/19 1132 07/05/19 1011 07/06/19 0447 07/07/19 0448  WBC 9.9 9.3 11.1* 11.5*  NEUTROABS 7.9* 7.1  --   --   HGB 10.0* 8.9* 8.3* 8.7*  HCT 33.7* 29.9* 27.8* 29.3*  MCV 91.6 90.3 90.0 89.3  PLT 355 333 374 379   Cardiac Enzymes: No results for input(s): CKTOTAL, CKMB,  CKMBINDEX, TROPONINI in the last 168 hours. CBG: Recent Labs  Lab 07/07/19 1130 07/07/19 1625 07/07/19 2104 07/08/19 0301 07/08/19 0748  GLUCAP 116* 127* 150* 134* 111*    Iron Studies: No results for input(s): IRON, TIBC, TRANSFERRIN, FERRITIN in the last 72 hours. Studies/Results: US Renal  Result Date: 07/06/2019 CLINICAL DATA:  Acute kidney injury. EXAM: RENAL / URINARY TRACT ULTRASOUND COMPLETE COMPARISON:  Noncontrast abdominal CT 05/31/2019, renal ultrasound 09/10/2017 FINDINGS: Right Kidney: Renal measurements: 8.9 x 5.6 x 6.1 cm = volume: 160 mL. No hydronephrosis. There is renal cortical thinning with increased parenchymal echogenicity. Mild scarring. Simple cyst measuring 1.2 cm in the upper kidney again seen. No suspicious solid lesion. No shadowing calculi. Left Kidney: Renal measurements: 9.0 x 5.5 x 5.0 cm = volume: 132 mL. No hydronephrosis. There is thinning of renal parenchyma with increased  renal echogenicity. Bladder: No bladder wall thickening. Both ureteral jets are visualized. Reported post void residual of 284 cc. Prevoid bladder volume not recorded. Other: None. IMPRESSION: 1. No hydronephrosis. 2. Thinning of bilateral renal parenchyma with increased renal echogenicity most consistent with chronic medical renal disease. 3. Post void residual of 284 cc. Electronically Signed   By: Keith Rake M.D.   On: 07/06/2019 12:45    Medications: Infusions:   Scheduled Medications: . allopurinol  100 mg Oral BID  . apixaban  5 mg Oral BID  . aspirin EC  81 mg Oral Daily  . atorvastatin  40 mg Oral QHS  . Chlorhexidine Gluconate Cloth  6 each Topical Daily  . diltiazem  120 mg Oral Daily  . feeding supplement (GLUCERNA SHAKE)  237 mL Oral BID BM  . fluticasone furoate-vilanterol  1 puff Inhalation Daily   And  . umeclidinium bromide  1 puff Inhalation Daily  . hydrALAZINE  25 mg Oral BID  . insulin aspart  0-5 Units Subcutaneous QHS  . insulin aspart  0-9 Units Subcutaneous TID WC  . insulin glargine  8 Units Subcutaneous QHS  . metoprolol succinate  75 mg Oral Daily  . mirtazapine  7.5 mg Oral QHS  . pantoprazole  40 mg Oral Daily  . tamsulosin  0.4 mg Oral Daily  . torsemide  50 mg Oral Daily    have reviewed scheduled and prn medications.  Physical Exam: General:NAD, sleeping, easily arousable Heart:RRR, s1s2 nl Lungs: Clear b/l, no crackle Abdomen:soft, Non-tender, non-distended Extremities: Trace lower extremity edema, +wrinkling of skin Neurology: Sleeping, arousable, no asterixis Skin: + nevus on L lower extremity  [name] 07/08/2019,11:29 AM  LOS: 2 days  Pager: [phone]

## 2019-07-08 NOTE — Discharge Summary (Signed)
Physician Discharge Summary  [name] HYQ:[id number] DOB: 04-24-1946 DOA: 07/05/2019  PCP: [name], MD  Admit date: 07/05/2019 Discharge date: 07/08/2019  Admitted From:  SNF Disposition:  SNF   Recommendations for Outpatient Follow-up:  1. Follow up with PCP in 2 weeks 2. Follow up with cardiology as scheduled 3. Establish care with nephrologist Dr. Theador Hawthorne in 1 month 4. Please obtain BMP in 1 week to follow up renal function and electrolytes 5. Permit bladder scans and In/Out caths as needed.  6. Monitor weight and notify provider of weight gain >5 pounds 7. CBG monitoring ACHS   Discharge Condition: STABLE   CODE STATUS: FULL    Brief Hospitalization Summary: Please see all hospital notes, images, labs for full details of the hospitalization. Dr. Talmadge Coventry HPI: [name] is a 73 y.o. female with medical history significant for diastolic CHF, CKD 3, OSA on CPAP, COPD, atrial fibrillation, coronary artery disease, diabetes mellitus and hypertension. Patient was brought to the ED from the [facility] for reports of altered mental status.  Patient son is present at bedside and helps with the history.  The time of my evaluation patient is awake and alert and answers questions appropriately.  He also reports that he was finding it hard to wake her up, and the eyes were closed.  Patient's was in the ED yesterday for similar complaints, she was sent back to the pain center after the work-up was negative.  Patient's son reports some occasional episodes of similar occurrence which resolve.  Patient also reports chronic difficulty breathing, she reports some worsening of her difficulty breathing, but unable to tell me duration.  She has chronic lower extremity swelling that is improved compared to prior.  She reports a mild cough.  No fever no chills. At baseline she is on 2 L of oxygen mostly only with activity.   ED Course: O2 sats 96% on 2 L, on room air patient's  O2 sat dropped to 85%. Hs troponin 29 > 44 > 53.  BNP 321.  Chest x-ray shows a degree of pulmonary vascular congestion and the degree of CHF.  EKG shows nonspecific T wave changes to lateral leads. Hospitalist to admit for elevated troponin, worsening hypoxia.  Brief Admission Hx: 73 y.o.femalewith medical history significant fordiastolic CHF, CKD 3, OSA on CPAP, COPD, atrial fibrillation, coronary artery disease, diabetes mellitus and hypertension.  Patient was brought to the ED from the [facility] for reports of altered mental status.  MDM/Assessment & Plan:   1. Mild acute CHF exacerbation - Pt has responded well to IV lasix and has diuresed since admission.  Following weights and I/Os closely.  Pt started back on lower dose torsemide 50 mg daily per nephrology and is doing much better and renal function is improving.     2. Paroxysmal atrial fibrillation -her heart rate remains well controlled on current medications which we will continue and she is fully anticoagulated with apixaban.  Continue metoprolol and Cardizem. 3. Altered mentation-RESOLVED.  This seems to be intermittent however she seems to be back to her baseline.  I think her worsening CKD likely contributed to her altered mentation in addition to UTI and we have discontinued prandial insulin to avoid hypoglycemia.  We have asked for nephrology consultation for assistance. 4. Enterobacter UTI -  Discussed with pharmD and treated with monurol (fosfomycin).  5. Type 2 diabetes mellitus-resume home Lantus. Discontinue prandial novolog insulin to reduce risk of hypoglycemia.  6. Stable COPD-continue home  bronchodilators. 7. Stage IIIb CKD-creatinine slightly improved, nephrology consult appreciated, she has been restarted on torsemide lower dose by nephrology and rechecked renal function shows improvement.  Renal ultrasound reviewed.  Establish care with Dr. Theador Hawthorne in 1 month for CKD.  8. Essential hypertension-blood pressures well  controlled on current home medications which have been continued. 9. Urinary retention - she had not been completely emptying bladder, will do trial of flomax to see if it helps and monitor. Bladder scans and I/O caths as needed recommended to be continued at SNF if possible.   DVT prophylaxis: Apixaban Code Status: Full Family Communication: Patient updated at bedside, son by telephone Disposition Plan: SNF   Consultants:  Nephrology   Discharge Diagnoses:  Active Problems:   DM type 2 with diabetic peripheral neuropathy (HCC)   OSA on CPAP   Hiatal hernia with GERD and esophagitis   Peripheral vertigo   Chronic obstructive pulmonary disease (HCC)   COPD exacerbation (HCC)   CKD stage 3 due to type 2 diabetes mellitus (HCC)   Chronic gout 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   Hypoxia   AKI (acute kidney injury) ([place])   Urinary retention, Female   Elevated troponin   Discharge Instructions:  Allergies as of 07/08/2019   No Known Allergies     Medication List    STOP taking these medications   guaiFENesin 600 MG 12 hr tablet Commonly known as: MUCINEX   HumaLOG KwikPen 100 UNIT/ML KwikPen Generic drug: insulin lispro     TAKE these medications   albuterol (2.5 MG/3ML) 0.083% nebulizer solution Commonly known as: PROVENTIL Take 2.5 mg by nebulization every 6 (six) hours as needed for wheezing or shortness of breath.   Ventolin HFA 108 (90 Base) MCG/ACT inhaler Generic drug: albuterol Inhale 2 puffs into the lungs every 6 (six) hours as needed for wheezing or shortness of breath.   allopurinol 100 MG tablet Commonly known as: ZYLOPRIM Take 100 mg by mouth 2 (two) times daily.   apixaban 5 MG Tabs tablet Commonly known as: ELIQUIS Take 1 tablet (5 mg total) by mouth 2 (two) times daily.   aspirin EC 81 MG tablet Take 81 mg by mouth daily.   atorvastatin 40 MG tablet Commonly known as: LIPITOR Take 1 tablet (40 mg total) by mouth at bedtime.    Biotin 5000 MCG Tabs Take 1 tablet by mouth daily.   diltiazem 120 MG 24 hr capsule Commonly known as: CARDIZEM CD Take 1 capsule (120 mg total) by mouth daily.   esomeprazole 40 MG capsule Commonly known as: NEXIUM TAKE 1 CAPSULE TWICE A DAY 30 MINUTES PRIOR TO MEALS What changed: See the new instructions.   famotidine 40 MG tablet Commonly known as: PEPCID TAKE 1 TABLET AT BEDTIME AS NEEDED TO CONTROL REFLUX What changed:   how much to take  how to take this  when to take this  reasons to take this  additional instructions   feeding supplement (GLUCERNA SHAKE) Liqd Take 237 mLs by mouth 2 (two) times daily between meals.   hydrALAZINE 25 MG tablet Commonly known as: APRESOLINE Take 25 mg by mouth 2 (two) times daily.   Lantus SoloStar 100 UNIT/ML Solostar Pen Generic drug: Insulin Glargine Inject 8 Units into the skin at bedtime.   loperamide 2 MG capsule Commonly known as: IMODIUM Take 2 mg by mouth every 6 (six) hours as needed for diarrhea or loose stools.   metoprolol succinate 25 MG 24 hr tablet Commonly known  as: TOPROL-XL Take 25 mg by mouth daily. Along with 50 mg to = 75 mg   metoprolol succinate 50 MG 24 hr tablet Commonly known as: TOPROL-XL Take 50 mg by mouth daily. Along with 25 mg to = 75 mg   mirtazapine 7.5 MG tablet Commonly known as: REMERON Take 7.5 mg by mouth at bedtime.   OXYGEN Inhale 2 L into the lungs. Every Shift   potassium chloride 10 MEQ tablet Commonly known as: KLOR-CON Take 1 tablet (10 mEq total) by mouth every other day. What changed:   medication strength  how much to take  when to take this   tamsulosin 0.4 MG Caps capsule Commonly known as: FLOMAX Take 1 capsule (0.4 mg total) by mouth daily.   torsemide 10 MG tablet Commonly known as: DEMADEX Take 5 tablets (50 mg total) by mouth daily. Start taking on: July 09, 2019 What changed:   how much to take  Another medication with the same name was  removed. Continue taking this medication, and follow the directions you see here.   Trelegy Ellipta 100-62.5-25 MCG/INH Aepb Generic drug: Fluticasone-Umeclidin-Vilant Inhale 1 puff into the lungs daily.      Follow-up Information    [name], MD. Schedule an appointment as soon as possible for a visit in 2 week(s).   Specialty: Family Medicine Why: Hospital Follow Up  Contact information: [place] [address] [phone]        Satira Sark, MD. Go to.   Specialty: Cardiology Why: as scheduled Contact information: [place] [address] [phone]        Liana Gerold, MD. Schedule an appointment as soon as possible for a visit in 1 month(s).   Specialty: Nephrology Why: establish care for CKD  Contact information: [address] [address] [phone]          No Known Allergies Allergies as of 07/08/2019   No Known Allergies     Medication List    STOP taking these medications   guaiFENesin 600 MG 12 hr tablet Commonly known as: MUCINEX   HumaLOG KwikPen 100 UNIT/ML KwikPen Generic drug: insulin lispro     TAKE these medications   albuterol (2.5 MG/3ML) 0.083% nebulizer solution Commonly known as: PROVENTIL Take 2.5 mg by nebulization every 6 (six) hours as needed for wheezing or shortness of breath.   Ventolin HFA 108 (90 Base) MCG/ACT inhaler Generic drug: albuterol Inhale 2 puffs into the lungs every 6 (six) hours as needed for wheezing or shortness of breath.   allopurinol 100 MG tablet Commonly known as: ZYLOPRIM Take 100 mg by mouth 2 (two) times daily.   apixaban 5 MG Tabs tablet Commonly known as: ELIQUIS Take 1 tablet (5 mg total) by mouth 2 (two) times daily.   aspirin EC 81 MG tablet Take 81 mg by mouth daily.   atorvastatin 40 MG tablet Commonly known as: LIPITOR Take 1 tablet (40 mg total) by mouth at bedtime.   Biotin 5000 MCG Tabs Take 1  tablet by mouth daily.   diltiazem 120 MG 24 hr capsule Commonly known as: CARDIZEM CD Take 1 capsule (120 mg total) by mouth daily.   esomeprazole 40 MG capsule Commonly known as: NEXIUM TAKE 1 CAPSULE TWICE A DAY 30 MINUTES PRIOR TO MEALS What changed: See the new instructions.   famotidine 40 MG tablet Commonly known as: PEPCID TAKE 1 TABLET AT BEDTIME AS  NEEDED TO CONTROL REFLUX What changed:   how much to take  how to take this  when to take this  reasons to take this  additional instructions   feeding supplement (GLUCERNA SHAKE) Liqd Take 237 mLs by mouth 2 (two) times daily between meals.   hydrALAZINE 25 MG tablet Commonly known as: APRESOLINE Take 25 mg by mouth 2 (two) times daily.   Lantus SoloStar 100 UNIT/ML Solostar Pen Generic drug: Insulin Glargine Inject 8 Units into the skin at bedtime.   loperamide 2 MG capsule Commonly known as: IMODIUM Take 2 mg by mouth every 6 (six) hours as needed for diarrhea or loose stools.   metoprolol succinate 25 MG 24 hr tablet Commonly known as: TOPROL-XL Take 25 mg by mouth daily. Along with 50 mg to = 75 mg   metoprolol succinate 50 MG 24 hr tablet Commonly known as: TOPROL-XL Take 50 mg by mouth daily. Along with 25 mg to = 75 mg   mirtazapine 7.5 MG tablet Commonly known as: REMERON Take 7.5 mg by mouth at bedtime.   OXYGEN Inhale 2 L into the lungs. Every Shift   potassium chloride 10 MEQ tablet Commonly known as: KLOR-CON Take 1 tablet (10 mEq total) by mouth every other day. What changed:   medication strength  how much to take  when to take this   tamsulosin 0.4 MG Caps capsule Commonly known as: FLOMAX Take 1 capsule (0.4 mg total) by mouth daily.   torsemide 10 MG tablet Commonly known as: DEMADEX Take 5 tablets (50 mg total) by mouth daily. Start taking on: July 09, 2019 What changed:   how much to take  Another medication with the same name was removed. Continue taking this  medication, and follow the directions you see here.   Trelegy Ellipta 100-62.5-25 MCG/INH Aepb Generic drug: Fluticasone-Umeclidin-Vilant Inhale 1 puff into the lungs daily.       Procedures/Studies: Dg Chest 2 View  Result Date: 07/05/2019 CLINICAL DATA:  Congestion and shortness of breath EXAM: CHEST - 2 VIEW COMPARISON:  Chest radiograph July 04, 2019 FINDINGS: Port-A-Cath tip is in the superior vena cava. No pneumothorax. There is mild interstitial prominence with suspected mild interstitial edema. No consolidation. Heart is mildly enlarged with pulmonary venous hypertension. No evident adenopathy. 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 There is degenerative change in the thoracic spine. IMPRESSION: There is a degree of pulmonary vascular congestion with mild interstitial edema. No consolidation. There may be a degree of congestive heart failure. Sizable paraesophageal hernia noted. Port-A-Cath tip in superior vena cava. Electronically Signed   By: Lowella Grip III M.D.   On: 07/05/2019 10:52   Ct Head Wo Contrast  Result Date: 07/05/2019 CLINICAL DATA:  Unexplained altered level of consciousness. EXAM: CT HEAD WITHOUT CONTRAST TECHNIQUE: Contiguous axial images were obtained from the base of the skull through the vertex without intravenous contrast. COMPARISON:  06/27/2015 FINDINGS: Brain: Age related atrophy. Mild chronic small-vessel change of the hemispheric white matter and pons. 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 No sign of mass lesion, hemorrhage, hydrocephalus or extra-axial collection. Vascular: There is atherosclerotic calcification of the major vessels at the base of the brain. Skull: Negative Sinuses/Orbits: Paranasal sinuses are clear. Orbits are negative. Mastoid effusion on the right. Other: None IMPRESSION: No acute intracranial finding. Atrophy and chronic small-vessel ischemic changes as seen previously. Electronically Signed   By: Nelson Chimes  M.D.   On: 07/05/2019 11:04   US Renal  Result Date: 07/06/2019 CLINICAL DATA:  Acute kidney injury. EXAM: RENAL / URINARY TRACT ULTRASOUND COMPLETE COMPARISON:  Noncontrast abdominal CT 05/31/2019, renal ultrasound 09/10/2017 FINDINGS: Right Kidney: Renal measurements: 8.9 x 5.6 x 6.1 cm = volume: 160 mL. No hydronephrosis. There is renal cortical thinning with increased parenchymal echogenicity. Mild scarring. Simple cyst measuring 1.2 cm in the upper kidney again seen. No suspicious solid lesion. No shadowing calculi. Left Kidney: Renal measurements: 9.0 x 5.5 x 5.0 cm = volume: 132 mL. No hydronephrosis. There is thinning of renal parenchyma with increased renal echogenicity. Bladder: No bladder wall thickening. Both ureteral jets are visualized. Reported post void residual of 284 cc. Prevoid bladder volume not recorded. Other: None. IMPRESSION: 1. No hydronephrosis. 2. Thinning of bilateral renal parenchyma with increased renal echogenicity most consistent with chronic medical renal disease. 3. Post void residual of 284 cc. Electronically Signed   By: Keith Rake M.D.   On: 07/06/2019 12:45   Dg Chest Portable 1 View  Result Date: 07/04/2019 CLINICAL DATA:  Altered mental status. Recent positive blood culture EXAM: PORTABLE CHEST 1 VIEW COMPARISON:  June 15, 2019 FINDINGS: Port-A-Cath tip is in the superior vena cava. No pneumothorax. There is no appreciable edema or consolidation. There is slight atelectatic change in the left lower lobe. Heart is upper normal in size with pulmonary vascularity normal. No adenopathy. 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 No bone lesions. IMPRESSION: Large paraesophageal hernia. Mild left base atelectasis. No edema or consolidation. Stable cardiac silhouette. Port-A-Cath tip in superior vena cava. Electronically Signed   By: Lowella Grip III M.D.   On: 07/04/2019 11:26   Dg Chest Portable 1 View  Result Date: 06/15/2019 CLINICAL DATA:  Dyspnea  EXAM: PORTABLE CHEST 1 VIEW COMPARISON:  05/31/2019, 05/08/2016, 07/12/2018 FINDINGS: Right-sided central venous port tip over the SVC. Retrocardiac opacity and lucency compatible with large hiatal hernia. Scarring at the bases. Stable cardiomediastinal silhouette. IMPRESSION: No active disease. Similar appearance of large hiatal hernia, mild cardiomegaly, and basilar scarring Electronically Signed   By: Donavan Foil M.D.   On: 06/15/2019 14:25     Subjective: Pt says that she is feeling better today.  She has been urinating with no difficulty.    Discharge Exam: Vitals:   07/08/19 0807 07/08/19 0808  BP:    Pulse:    Resp:    Temp:    SpO2: 95% 95%   Vitals:   07/08/19 0446 07/08/19 0500 07/08/19 0807 07/08/19 0808  BP: 108/67     Pulse: 91     Resp: 18     Temp: 98.3 F (36.8 C)     TempSrc:      SpO2: 95%  95% 95%  Weight:  90.1 kg    Height:       General exam: Chronically ill-appearing female she is mostly bedbound in the bed in no apparent distress cooperative and oriented x3.  Respiratory system: sounds clear. No increased work of breathing. Cardiovascular system: S1 & S2 heard. No JVD, murmurs, gallops, clicks or pedal edema. Gastrointestinal system: Abdomen is nondistended, soft and nontender. Normal bowel sounds heard. Central nervous system: Alert and oriented. No focal neurological deficits. Extremities: chronic lymphedema BLEs.   The results of significant diagnostics from this hospitalization (including imaging, microbiology, ancillary and laboratory) are listed below for reference.     Microbiology: Recent Results (from the past 240 hour(s))  Culture, blood (Routine X 2) w Reflex to ID Panel     Status: Abnormal  Collection Time: 07/01/19 12:55 PM   Specimen: BLOOD  Result Value Ref Range Status   Specimen Description   Final    BLOOD PORTA CATH Performed at [facility], [address], [place], [place] [address]    Special Requests   Final    BOTTLES  DRAWN AEROBIC AND ANAEROBIC Blood Culture adequate volume Performed at [facility], [address], [place], [place] [address]    Culture  Setup Time   Final    GRAM POSITIVE COCCI AEROBIC BOTTLE ONLY Gram Stain Report Called to,Read Back By and Verified With: TEJEDA @ [id number] ON [id number] BY HENDERSON L. Performed at [facility], [address], [place], [place] [address]    Culture (A)  Final    STAPHYLOCOCCUS SPECIES (COAGULASE NEGATIVE) THE SIGNIFICANCE OF ISOLATING THIS ORGANISM FROM A SINGLE VENIPUNCTURE CANNOT BE PREDICTED WITHOUT FURTHER CLINICAL AND CULTURE CORRELATION. SUSCEPTIBILITIES AVAILABLE ONLY ON REQUEST. Performed at [facility], [place] [address], [place], [place] [address]    Report Status 07/04/2019 FINAL  Final  Urine culture     Status: Abnormal   Collection Time: 07/04/19 10:59 AM   Specimen: Urine, Random  Result Value Ref Range Status   Specimen Description   Final    URINE, RANDOM Performed at [facility], [address] [place], [place] [address]    Special Requests   Final    NONE Performed at [facility], [address] [place], [place] [address]    Culture >=100,000 COLONIES/mL ENTEROBACTER AEROGENES (A)  Final   Report Status 07/06/2019 FINAL  Final   Organism ID, Bacteria ENTEROBACTER AEROGENES (A)  Final      Susceptibility   Enterobacter aerogenes - MIC*    CEFAZOLIN >=64 RESISTANT Resistant     CEFTRIAXONE 16 INTERMEDIATE Intermediate     CIPROFLOXACIN <=0.25 SENSITIVE Sensitive     GENTAMICIN <=1 SENSITIVE Sensitive     IMIPENEM 1 SENSITIVE Sensitive     NITROFURANTOIN 64 INTERMEDIATE Intermediate     TRIMETH/SULFA <=20 SENSITIVE Sensitive     PIP/TAZO >=128 RESISTANT Resistant     * >=100,000 COLONIES/mL ENTEROBACTER AEROGENES  SARS CORONAVIRUS 2 (TAT 6-24 HRS) Nasopharyngeal Nasopharyngeal Swab     Status: None   Collection Time: 07/04/19 11:17 AM   Specimen: Nasopharyngeal Swab  Result Value Ref Range Status   SARS Coronavirus 2  NEGATIVE NEGATIVE Final    Comment: (NOTE) SARS-CoV-2 target nucleic acids are NOT DETECTED. The SARS-CoV-2 RNA is generally detectable in upper and lower respiratory specimens during the acute phase of infection. Negative results do not preclude SARS-CoV-2 infection, do not rule out co-infections with other pathogens, and should not be used as the sole basis for treatment or other patient management decisions. Negative results must be combined with clinical observations, patient history, and epidemiological information. The expected result is Negative. Fact Sheet for Patients: SugarRoll.be Fact Sheet for Healthcare Providers: https://www.woods-mathews.com/ This test is not yet approved or cleared by the [place] FDA and  has been authorized for detection and/or diagnosis of SARS-CoV-2 by FDA under an Emergency Use Authorization (EUA). This EUA will remain  in effect (meaning this test can be used) for the duration of the COVID-19 declaration under Section 56 4(b)(1) of the Act, 21 U.S.C. section 360bbb-3(b)(1), unless the authorization is terminated or revoked sooner. Performed at [facility], [place] [address] [place], [place] [address]   Culture, blood (routine x 2)     Status: None (Preliminary result)   Collection  Time: 07/04/19 11:32 AM   Specimen: Right Antecubital; Blood  Result Value Ref Range Status   Specimen Description RIGHT ANTECUBITAL BOTTLES DRAWN AEROBIC ONLY  Final   Special Requests Blood Culture adequate volume  Final   Culture   Final    NO GROWTH 4 DAYS Performed at [facility], [address] [place], [place] [address]    Report Status PENDING  Incomplete  Culture, blood (routine x 2)     Status: None (Preliminary result)   Collection Time: 07/04/19 11:33 AM   Specimen: Left Antecubital; Blood  Result Value Ref Range Status   Specimen Description LEFT ANTECUBITAL BOTTLES DRAWN AEROBIC ONLY  Final    Special Requests   Final    Blood Culture results may not be optimal due to an inadequate volume of blood received in culture bottles   Culture   Final    NO GROWTH 4 DAYS Performed at [facility], [address] [place], [place] [address]    Report Status PENDING  Incomplete     Labs: BNP (last 3 results) Recent Labs    05/31/19 1658 06/15/19 1347 07/05/19 1359  BNP 206.0* 327.0* 031.5*   Basic Metabolic Panel: Recent Labs  Lab 07/04/19 1132 07/05/19 1955 07/06/19 0447 07/07/19 0448 07/08/19 0502  NA 142 139 145 143 143  K 3.9 3.7 3.5 3.5 4.2  CL 93* 94* 96* 95* 94*  CO2 35* 34* 37* 36* 36*  GLUCOSE 126* 145* 104* 112* 125*  BUN 116* 116* 120* 104* 105*  CREATININE 3.31* 4.20* 3.95* 3.12* 2.87*  CALCIUM 8.1* 7.6* 8.0* 8.1* 8.1*  PHOS  --   --   --  2.9 2.7   Liver Function Tests: Recent Labs  Lab 07/04/19 1132 07/07/19 0448 07/08/19 0502  AST 18  --   --   ALT 11  --   --   ALKPHOS 70  --   --   BILITOT 1.1  --   --   PROT 7.0  --   --   ALBUMIN 2.7* 2.4* 2.4*   No results for input(s): LIPASE, AMYLASE in the last 168 hours. No results for input(s): AMMONIA in the last 168 hours. CBC: Recent Labs  Lab 07/04/19 1132 07/05/19 1011 07/06/19 0447 07/07/19 0448  WBC 9.9 9.3 11.1* 11.5*  NEUTROABS 7.9* 7.1  --   --   HGB 10.0* 8.9* 8.3* 8.7*  HCT 33.7* 29.9* 27.8* 29.3*  MCV 91.6 90.3 90.0 89.3  PLT 355 333 374 379   Cardiac Enzymes: No results for input(s): CKTOTAL, CKMB, CKMBINDEX, TROPONINI in the last 168 hours. BNP: Invalid input(s): POCBNP CBG: Recent Labs  Lab 07/07/19 1625 07/07/19 2104 07/08/19 0301 07/08/19 0748 07/08/19 1159  GLUCAP 127* 150* 134* 111* 115*   D-Dimer No results for input(s): DDIMER in the last 72 hours. Hgb A1c Recent Labs    07/05/19 1955  HGBA1C 6.8*   Lipid Profile No results for input(s): CHOL, HDL, LDLCALC, TRIG, CHOLHDL, LDLDIRECT in the last 72 hours. Thyroid function studies No results for  input(s): TSH, T4TOTAL, T3FREE, THYROIDAB in the last 72 hours.  Invalid input(s): FREET3 Anemia work up No results for input(s): VITAMINB12, FOLATE, FERRITIN, TIBC, IRON, RETICCTPCT in the last 72 hours. Urinalysis    Component Value Date/Time   COLORURINE YELLOW 07/04/2019 1059   APPEARANCEUR CLEAR 07/04/2019 1059   LABSPEC 1.011 07/04/2019 1059   PHURINE 5.0 07/04/2019 1059   GLUCOSEU NEGATIVE 07/04/2019 1059   HGBUR SMALL (A) 07/04/2019 1059  BILIRUBINUR NEGATIVE 07/04/2019 1059   [place] 07/04/2019 1059   PROTEINUR NEGATIVE 07/04/2019 1059   UROBILINOGEN 0.2 01/29/2007 1500   NITRITE NEGATIVE 07/04/2019 1059   LEUKOCYTESUR NEGATIVE 07/04/2019 1059   Sepsis Labs Invalid input(s): PROCALCITONIN,  WBC,  LACTICIDVEN Microbiology Recent Results (from the past 240 hour(s))  Culture, blood (Routine X 2) w Reflex to ID Panel     Status: Abnormal   Collection Time: 07/01/19 12:55 PM   Specimen: BLOOD  Result Value Ref Range Status   Specimen Description   Final    BLOOD PORTA CATH Performed at [facility], [address] [address] [place], [place] [address]    Special Requests   Final    BOTTLES DRAWN AEROBIC AND ANAEROBIC Blood Culture adequate volume Performed at [facility], [address], [place], [place] [address]    Culture  Setup Time   Final    GRAM POSITIVE COCCI AEROBIC BOTTLE ONLY Gram Stain Report Called to,Read Back By and Verified With: TEJEDA @ [phone] ON [id number] BY HENDERSON L. Performed at [facility], [address] [place], [place] [address]    Culture (A)  Final    STAPHYLOCOCCUS SPECIES (COAGULASE NEGATIVE) THE SIGNIFICANCE OF ISOLATING THIS ORGANISM FROM A SINGLE VENIPUNCTURE CANNOT BE PREDICTED WITHOUT FURTHER CLINICAL AND CULTURE CORRELATION. SUSCEPTIBILITIES AVAILABLE ONLY ON REQUEST. Performed at [facility], [place] [address], [place], [place] [address]    Report Status 07/04/2019 FINAL  Final  Urine culture     Status: Abnormal    Collection Time: 07/04/19 10:59 AM   Specimen: Urine, Random  Result Value Ref Range Status   Specimen Description   Final    URINE, RANDOM Performed at [facility], [address] [place], [place] [address]    Special Requests   Final    NONE Performed at [facility], [address] [place], [place] [address]    Culture >=100,000 COLONIES/mL ENTEROBACTER AEROGENES (A)  Final   Report Status 07/06/2019 FINAL  Final   Organism ID, Bacteria ENTEROBACTER AEROGENES (A)  Final      Susceptibility   Enterobacter aerogenes - MIC*    CEFAZOLIN >=64 RESISTANT Resistant     CEFTRIAXONE 16 INTERMEDIATE Intermediate     CIPROFLOXACIN <=0.25 SENSITIVE Sensitive     GENTAMICIN <=1 SENSITIVE Sensitive     IMIPENEM 1 SENSITIVE Sensitive     NITROFURANTOIN 64 INTERMEDIATE Intermediate     TRIMETH/SULFA <=20 SENSITIVE Sensitive     PIP/TAZO >=128 RESISTANT Resistant     * >=100,000 COLONIES/mL ENTEROBACTER AEROGENES  SARS CORONAVIRUS 2 (TAT 6-24 HRS) Nasopharyngeal Nasopharyngeal Swab     Status: None   Collection Time: 07/04/19 11:17 AM   Specimen: Nasopharyngeal Swab  Result Value Ref Range Status   SARS Coronavirus 2 NEGATIVE NEGATIVE Final    Comment: (NOTE) SARS-CoV-2 target nucleic acids are NOT DETECTED. The SARS-CoV-2 RNA is generally detectable in upper and lower respiratory specimens during the acute phase of infection. Negative results do not preclude SARS-CoV-2 infection, do not rule out co-infections with other pathogens, and should not be used as the sole basis for treatment or other patient management decisions. Negative results must be combined with clinical observations, patient history, and epidemiological information. The expected result is Negative. Fact Sheet for Patients: SugarRoll.be Fact Sheet for Healthcare Providers: https://www.woods-mathews.com/ This test is not yet approved or cleared by the [place] FDA and  has  been authorized for detection and/or diagnosis of SARS-CoV-2 by FDA under an Emergency Use  Authorization (EUA). This EUA will remain  in effect (meaning this test can be used) for the duration of the COVID-19 declaration under Section 56 4(b)(1) of the Act, 21 U.S.C. section 360bbb-3(b)(1), unless the authorization is terminated or revoked sooner. Performed at [facility], [place] [address], [place], [place] [address]   Culture, blood (routine x 2)     Status: None (Preliminary result)   Collection Time: 07/04/19 11:32 AM   Specimen: Right Antecubital; Blood  Result Value Ref Range Status   Specimen Description RIGHT ANTECUBITAL BOTTLES DRAWN AEROBIC ONLY  Final   Special Requests Blood Culture adequate volume  Final   Culture   Final    NO GROWTH 4 DAYS Performed at [facility], [address], [place], [place] [address]    Report Status PENDING  Incomplete  Culture, blood (routine x 2)     Status: None (Preliminary result)   Collection Time: 07/04/19 11:33 AM   Specimen: Left Antecubital; Blood  Result Value Ref Range Status   Specimen Description LEFT ANTECUBITAL BOTTLES DRAWN AEROBIC ONLY  Final   Special Requests   Final    Blood Culture results may not be optimal due to an inadequate volume of blood received in culture bottles   Culture   Final    NO GROWTH 4 DAYS Performed at [facility], [address], [place], [place] [address]    Report Status PENDING  Incomplete   Time coordinating discharge: 36 minutes   SIGNED:  Irwin Brakeman, MD  [facility] 07/08/2019, 12:04 PM How to contact the [facility] Attending or Consulting provider [place] or covering provider during after hours [place], for this patient?  1. Check the care team in [facility] and look for a) attending/consulting TRH provider listed and b) the [facility] team listed 2. Log into [email or web address] and use [place]'s universal password to access. If you do not have the password, please contact the hospital  operator. 3. Locate the [facility] provider you are looking for under [facility] and page to a number that you can be directly reached. 4. If you still have difficulty reaching the provider, please page the [facility] (Director on Call) for the Hospitalists listed on amion for assistance.

## 2019-07-08 NOTE — Discharge Instructions (Signed)
Ok to continue bladder scans and I/O cath as needed Please check CBC and BMP in 1 week to follow renal function to be sure it continues to improve.

## 2019-07-08 NOTE — TOC Transition Note (Signed)
Transition of Care [facility]) - CM/SW Discharge Note   Patient Details  Name: [name] MRN: [id number] Date of Birth: 04-19-1946  [facility] CM/SW Contact:  Ihor Gully, LCSW Phone Number: 07/08/2019, 1:41 PM   Clinical Narrative:    [facility]. Son, [name], notified of discharge and patient's plan to return to [facility] to complete short term rehab. Tami at [facility] notified of discharge.  Discharge clinicals sent to facility.  TOC signing off.     Barriers to Discharge: Continued Medical Work up   Patient Goals and CMS Choice Patient states their goals for this hospitalization and ongoing recovery are:: Patient is unsure as to whether she wants to go home or return to [facility]      Discharge Placement                       Discharge Plan and Services                                     Social Determinants of Health (SDOH) Interventions     Readmission Risk Interventions Readmission Risk Prevention Plan 07/07/2019 06/21/2019 06/18/2019  Transportation Screening Complete - Complete  PCP or Specialist Appt within 3-5 Days - Not Complete -  Not Complete comments - Pt goign to SNF -  [place] or [place] or Home Care Consult comments - - -  Social Work Consult for [place] Planning/Counseling - - Complete  Palliative Care Screening - - Not Applicable  Medication Review Press photographer) Complete - Complete  SW Recovery Care/Counseling Consult Complete - -  Palliative Care Screening Not Applicable - -  Skilled Nursing Facility Complete - -  Some recent data might be hidden

## 2019-07-09 ENCOUNTER — Encounter: Payer: Self-pay | Admitting: Adult Health

## 2019-07-09 ENCOUNTER — Non-Acute Institutional Stay (SKILLED_NURSING_FACILITY): Payer: Medicare Other | Admitting: Adult Health

## 2019-07-09 DIAGNOSIS — E1122 Type 2 diabetes mellitus with diabetic chronic kidney disease: Secondary | ICD-10-CM

## 2019-07-09 DIAGNOSIS — I5032 Chronic diastolic (congestive) heart failure: Secondary | ICD-10-CM

## 2019-07-09 DIAGNOSIS — I4892 Unspecified atrial flutter: Secondary | ICD-10-CM

## 2019-07-09 DIAGNOSIS — N183 Chronic kidney disease, stage 3 unspecified: Secondary | ICD-10-CM

## 2019-07-09 DIAGNOSIS — I130 Hypertensive heart and chronic kidney disease with heart failure and stage 1 through stage 4 chronic kidney disease, or unspecified chronic kidney disease: Secondary | ICD-10-CM | POA: Diagnosis not present

## 2019-07-09 DIAGNOSIS — R339 Retention of urine, unspecified: Secondary | ICD-10-CM

## 2019-07-09 DIAGNOSIS — K449 Diaphragmatic hernia without obstruction or gangrene: Secondary | ICD-10-CM

## 2019-07-09 DIAGNOSIS — E785 Hyperlipidemia, unspecified: Secondary | ICD-10-CM

## 2019-07-09 DIAGNOSIS — J9622 Acute and chronic respiratory failure with hypercapnia: Secondary | ICD-10-CM

## 2019-07-09 DIAGNOSIS — M1A30X0 Chronic gout due to renal impairment, unspecified site, without tophus (tophi): Secondary | ICD-10-CM

## 2019-07-09 DIAGNOSIS — K2100 Gastro-esophageal reflux disease with esophagitis, without bleeding: Secondary | ICD-10-CM

## 2019-07-09 DIAGNOSIS — J449 Chronic obstructive pulmonary disease, unspecified: Secondary | ICD-10-CM

## 2019-07-09 DIAGNOSIS — E1169 Type 2 diabetes mellitus with other specified complication: Secondary | ICD-10-CM

## 2019-07-09 DIAGNOSIS — E1142 Type 2 diabetes mellitus with diabetic polyneuropathy: Secondary | ICD-10-CM

## 2019-07-09 DIAGNOSIS — N390 Urinary tract infection, site not specified: Secondary | ICD-10-CM

## 2019-07-09 DIAGNOSIS — N1832 Chronic kidney disease, stage 3b: Secondary | ICD-10-CM

## 2019-07-09 DIAGNOSIS — D500 Iron deficiency anemia secondary to blood loss (chronic): Secondary | ICD-10-CM

## 2019-07-09 DIAGNOSIS — E1140 Type 2 diabetes mellitus with diabetic neuropathy, unspecified: Secondary | ICD-10-CM

## 2019-07-09 DIAGNOSIS — J9621 Acute and chronic respiratory failure with hypoxia: Secondary | ICD-10-CM

## 2019-07-09 LAB — CULTURE, BLOOD (ROUTINE X 2)
Culture: NO GROWTH
Culture: NO GROWTH
Special Requests: ADEQUATE

## 2019-07-09 NOTE — Progress Notes (Signed)
Location:    [place] Room Number: 159/P Place of Service:  SNF (31)   CODE STATUS: Full Code  No Known Allergies  Chief Complaint  Patient presents with   Hospitalization Follow-up    Hospialization Follow Up    HPI:  She is a 73 year old short term rehab patient of this facility who has been hospitalized from 07-05-19 through 07-08-19. She was treated for altered mental status due to uti. She was treated for acute on chronic congestive heart failure. She is here for short term rehab with her goal to return back home. She denies any cough or shortness of breath. States that she feel much better. There are no reports of fevers present. She continues to be followed for her chronic illnesses including: chf; diabetes ckd.   Past Medical History:  Diagnosis Date   Blood transfusion without reported diagnosis    CAD (coronary artery disease)    Nonobstructive at cardiac catheterization 2000   Cataract    Cervical cancer ([place]) 1978   CHF (congestive heart failure) (HCC)    Chronic back pain    Chronic kidney disease    COPD (chronic obstructive pulmonary disease) (HCC)    Degenerative disc disease    Essential hypertension, benign    GERD (gastroesophageal reflux disease)    Gout    Hiatal hernia 07/27/2013   History of diverticulitis of colon    History of hiatal hernia    Iron deficiency anemia    Irritable bowel syndrome    Lumbar radiculopathy    Mixed hyperlipidemia    Neuropathy    Osteoporosis    Ovarian cancer ([place]) 1978   patient denies. States this was cervical cancer   Oxygen deficiency    Sleep apnea    Type 2 diabetes mellitus ([place])    Vitamin B deficiency 12/25/2009   Vitamin B12 deficiency     Past Surgical History:  Procedure Laterality Date   ABDOMINAL HYSTERECTOMY     BACK SURGERY     Benign breast cysts     CHOLECYSTECTOMY     COLONOSCOPY  10/01/2006   SLF:Pan colonic diverticulosis and  moderate internal hemorrhoids/ Otherwise no polyps, masses, inflammatory changes or AVMs/   COLONOSCOPY  2011   SLF: pancolonic diverticulosis, large internal hemorrhoids   COLONOSCOPY N/A 01/26/2016   Procedure: COLONOSCOPY;  Surgeon: [name], MD;  Location: [facility];  Service: Endoscopy;  Laterality: N/A;  830    ESOPHAGOGASTRODUODENOSCOPY  11/19/2006   SLF: Large hiatal hernia without evidence of [place] ulcers/. Distal esophageal stricture, which allowed the gastroscope to pass without resistance.  A 16 mm Savary later passed with mild resistance/ Normal stomach.sb bx negative   ESOPHAGOGASTRODUODENOSCOPY  10/01/2006   ZDG:LOVFI hiatal hernia.  Distal esophagus without evidence of   erythema, ulceration or Barrett's esophagus   ESOPHAGOGASTRODUODENOSCOPY  2011   SLF: large hh, distal esophageal web narrowing to 64mm s/p dilation to 50mm   ESOPHAGOGASTRODUODENOSCOPY N/A 08/06/2013   SLF: 1. Stricture at the gastroesophageal junction 2. large hiatal hernia. 3. Mild erosive gastritis.   GIVENS CAPSULE STUDY N/A 08/06/2013   INCOMPLETE-SMALL BOWLE ULCERS   KNEE SURGERY Right    PARTIAL HYSTERECTOMY  1978   small bowel capsule  2008   negative   SPINE SURGERY     TONSILLECTOMY AND ADENOIDECTOMY     Two back surgeries/fusion     UMBILICAL HERNIA REPAIR  2010    Social History   Socioeconomic  History   Marital status: Widowed    Spouse name: Rasheed   Number of children: 4   Years of education: Not on file   Highest education level: 10th grade  Occupational History   Occupation: retired    Comment: Ambulance person, [employer] work  Scientist, product/process development strain: Not on file   Food insecurity    Worry: Not on file    Inability: Not on Lexicographer needs    Medical: Not on file    Non-medical: Not on file  Tobacco Use   Smoking status: Former Smoker    Packs/day: 1.00    Years: 1.00    Pack years: 1.00    Types: Cigarettes    Start  date: 02/19/1961    Quit date: 08/05/1961    Years since quitting: 57.9   Smokeless tobacco: Never Used   Tobacco comment: Quit smoking x 50 years  Substance and Sexual Activity   Alcohol use: No   Drug use: No   Sexual activity: Not Currently  Lifestyle   Physical activity    Days per week: Not on file    Minutes per session: Not on file   Stress: Not on file  Relationships   Social connections    Talks on phone: Not on file    Gets together: Not on file    Attends religious service: Not on file    Active member of club or organization: Not on file    Attends meetings of clubs or organizations: Not on file    Relationship status: Not on file   Intimate partner violence    Fear of current or ex partner: Not on file    Emotionally abused: Not on file    Physically abused: Not on file    Forced sexual activity: Not on file  Other Topics Concern   Not on file  Social History Narrative   HAS 4 SON-GRAND KIDS [place].   Lives in home with [place] - married 13 Y   Cook, sew, quilt, crochet   Family History  Problem Relation Age of Onset   Colon cancer Brother        diagnosed age 20. Living.    Ulcers Sister    Diabetes Sister    Heart attack Sister    Kidney failure Sister    Stroke Sister    Ulcers Mother    Diabetes Mother    Heart attack Mother    Stroke Mother    Asthma Mother    Heart disease Mother    Cervical cancer Mother    Heart attack Brother    Heart disease Brother    Asthma Sister    Diabetes Brother    Stroke Maternal Grandmother    Heart attack Maternal Grandmother    Heart attack Other    Early death Father        MVA in his 55s      VITAL SIGNS BP 138/77    Pulse 90    Temp (!) 97 F (36.1 C) (Oral)    Resp 20    Ht 4\' 8"  (1.422 m)    Wt 193 lb 6.4 oz (87.7 kg)    SpO2 93%    BMI 43.36 kg/m   Outpatient Encounter Medications as of 07/09/2019  Medication Sig   albuterol (PROVENTIL) (2.5 MG/3ML)  0.083% nebulizer solution Take 2.5 mg by nebulization every 6 (six) hours as needed for wheezing or shortness  of breath.    albuterol (VENTOLIN HFA) 108 (90 Base) MCG/ACT inhaler Inhale 2 puffs into the lungs every 6 (six) hours as needed for wheezing or shortness of breath.    allopurinol (ZYLOPRIM) 100 MG tablet Take 100 mg by mouth 2 (two) times daily.    apixaban (ELIQUIS) 5 MG TABS tablet Take 1 tablet (5 mg total) by mouth 2 (two) times daily.   aspirin EC 81 MG tablet Take 81 mg by mouth daily.   atorvastatin (LIPITOR) 40 MG tablet Take 1 tablet (40 mg total) by mouth at bedtime.   Biotin 5 MG CAPS Take 5 mg by mouth daily.   diltiazem (CARDIZEM CD) 120 MG 24 hr capsule Take 1 capsule (120 mg total) by mouth daily.   esomeprazole (NEXIUM) 40 MG capsule TAKE 1 CAPSULE TWICE A DAY 30 MINUTES PRIOR TO MEALS   famotidine (PEPCID) 40 MG tablet TAKE 1 TABLET AT BEDTIME AS NEEDED TO CONTROL REFLUX   feeding supplement, GLUCERNA SHAKE, (GLUCERNA SHAKE) LIQD Take 237 mLs by mouth 2 (two) times daily between meals.   hydrALAZINE (APRESOLINE) 25 MG tablet Take 25 mg by mouth 2 (two) times daily.   LANTUS SOLOSTAR 100 UNIT/ML SOPN Inject 8 Units into the skin at bedtime.    loperamide (IMODIUM) 2 MG capsule Take 2 mg by mouth every 6 (six) hours as needed for diarrhea or loose stools.    metoprolol succinate (TOPROL-XL) 25 MG 24 hr tablet Take 25 mg by mouth daily. Along with 50 mg to = 75 mg   metoprolol succinate (TOPROL-XL) 50 MG 24 hr tablet Take 50 mg by mouth daily. Along with 25 mg to = 75 mg   mirtazapine (REMERON) 7.5 MG tablet Take 7.5 mg by mouth at bedtime.   NON FORMULARY Diet: Regular, NAS Consistent Carbohydrate   OXYGEN Inhale 2 L into the lungs. Every Shift   potassium chloride SA (KLOR-CON) 10 MEQ tablet Take 1 tablet (10 mEq total) by mouth every other day.   tamsulosin (FLOMAX) 0.4 MG CAPS capsule Take 1 capsule (0.4 mg total) by mouth daily.   torsemide  (DEMADEX) 10 MG tablet Take 5 tablets (50 mg total) by mouth daily.   TRELEGY ELLIPTA 100-62.5-25 MCG/INH AEPB Inhale 1 puff into the lungs daily.   [DISCONTINUED] Biotin 5000 MCG TABS Take 1 tablet by mouth daily.    No facility-administered encounter medications on file as of 07/09/2019.      SIGNIFICANT DIAGNOSTIC EXAMS  PREVIOUS;  05-31-19: ct of abdomen and pelvis:  Large hiatal hernia stable from the prior exam. Some wall thickening in the stomach is noted suspicious for gastritis. No obstructive changes are noted. Chronic changes similar to that seen on the prior exam. Changes of anasarca stable from the previous study.  05-31-19: chest x-ray:  1. Irregularity along the right heart border likely corresponds to the large hiatal hernia seen on CT of the abdomen pelvis. 2. Bibasilar atelectasis. No other acute cardiopulmonary abnormality. Increased attenuation of the bases likely related to body habitus. 3. Slightly lobular configuration of the heart could reflect the small volume pericardial fluid seen on CT abdomen pelvis.  06-15-19: chest x-ray; No active disease. Similar appearance of large hiatal hernia, mild cardiomegaly, and basilar scarring  06-16-19: 2-echo:   1. Left ventricular ejection fraction, by visual estimation, is 55 to 60%. The left ventricle has normal function. There is no left ventricular hypertrophy.  2. Elevated left atrial pressure.  3. Left ventricular diastolic parameters are consistent  with Grade II diastolic dysfunction (pseudonormalization).  4. Global right ventricle has normal systolic function.The right ventricular size is normal. No increase in right ventricular wall thickness.  5. Left atrial size was moderately dilated.  6. Right atrial size was normal.  7. The mitral valve was not well visualized. Trace mitral valve regurgitation. No evidence of mitral stenosis.  8. The tricuspid valve is normal in structure. Tricuspid valve regurgitation is  mild.  9. The aortic valve was not well visualized. Aortic valve regurgitation is not visualized. No evidence of aortic valve sclerosis or stenosis. 10. The pulmonic valve was not well visualized. Pulmonic valve regurgitation not assessed. 11. The aortic root was not well visualized. 12. Moderately elevated pulmonary artery systolic pressure. 13. Moderate pulmonary HTN, PASP is 48 mmHg. 14. The inferior vena cava is normal in size with greater than 50% respiratory variability, suggesting right atrial pressure of 3 mmHg. 15. The interatrial septum was not well visualized.  TODAY;   07-05-19: chest x-ray: There is a degree of pulmonary vascular congestion with mild interstitial edema. No consolidation. There may be a degree of congestive heart failure. Sizable paraesophageal hernia noted. Port-A-Cath tip in superior vena cava.  07-05-19: ct of head: No acute intracranial finding. Atrophy and chronic small-vessel ischemic changes as seen previously.  07-06-19: renal ultrasound:  1. No hydronephrosis. 2. Thinning of bilateral renal parenchyma with increased renal echogenicity most consistent with chronic medical renal disease. 3. Post void residual of 284 cc.      LABS REVIEWED: PREVIOUS  06-15-19: wbc 10.7; hgb 11.2; hct 36.8; mcv 88.5 plt 250; glucose 181; bun 64; creat 2.05; k+ 3.9; na++ 138; ca 8.2; BNP 327.0 06-17-19: glucose 122; bun 65; creat 1.263; k+ 4.1; na++ 141; ca 8.1; mag 1.6 06-19-19: glucose 117; bun 65; creat 2.44; k+ 3.9; na++ 142; ca 8.2  06-28-19: wbc 11.9; hgb 10.0; hct 33.1; mvc 89.0 plt 239; glucose 34; bun 85; creat 3.42; k+ 3.7; na++ 140; ca 8.0   TODAY;   07-04-19: wbc 9.9; hgb 10.0 ;hct 33.7; mcv 91.6 plt 35; glucose 126; bun 116; creat 3.31; k+ 3.9; na++ 142; ca 8.1 liver normal albumin 2.7 urine culture: enterobacter aerogenes 07-04-29: hgb a1c 6.8 07-07-19: wbc 11.5; hgb 8.7; hct 29.3; mcv 89.3 plt 379; glucose 112; bun 104; creat 3.12; k+ 3.5; an++ 143; ca  8.1 phos 2.9 albumin 2.4   Review of Systems  Constitutional: Negative for malaise/fatigue.  Respiratory: Negative for cough and shortness of breath.   Cardiovascular: Negative for chest pain, palpitations and leg swelling.  Gastrointestinal: Negative for abdominal pain, constipation and heartburn.  Musculoskeletal: Negative for back pain, joint pain and myalgias.  Skin: Negative.   Neurological: Negative for dizziness.  Psychiatric/Behavioral: The patient is not nervous/anxious.       Physical Exam Constitutional:      General: She is not in acute distress.    Appearance: She is well-developed. She is not diaphoretic.  Neck:     Thyroid: No thyromegaly.  Cardiovascular:     Rate and Rhythm: Normal rate and regular rhythm.     Pulses: Normal pulses.     Heart sounds: Normal heart sounds.  Pulmonary:     Effort: Pulmonary effort is normal. No respiratory distress.     Breath sounds: Normal breath sounds.     Comments: 02 dependent  Abdominal:     General: Bowel sounds are normal. There is no distension.     Palpations: Abdomen is soft.  Tenderness: There is no abdominal tenderness.  Musculoskeletal:        General: Normal range of motion.     Cervical back: Neck supple.     Right lower leg: No edema.     Left lower leg: No edema.  Lymphadenopathy:     Cervical: No cervical adenopathy.  Skin:    General: Skin is warm and dry.  Neurological:     Mental Status: She is alert and oriented to person, place, and time.  Psychiatric:        Mood and Affect: Mood normal.      ASSESSMENT/ PLAN:  TODAY;   1. Chronic diastolic CHF (congestive heart failure) is stable EF 55-60 % (06-16-19) will continue demadex 50 gm daily with k+ 10 meq every other day toprol xl 75 mg dail apresoline 25 mg twice daily will monitor   2. Atrial flutter with rapid ventricular response: heart rate is stable will continue cardizem cd 120 gm daily and toprol xl 75 mg daily for rate control and  eliquis 5 mg twice daily   3. Acute UTI: is stable has completed abt will monitor her status.   4. 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 OSA on CPAP is stable uses cpap nightly   6. obstructive chronic bronchitis without exacerbation /chronic respiratory failure with hypoxia: stable is 02 dependent will continue trelegy 100/625/25 mcg 1 puff daily has albuterol 2 puffs/neb treatment every 6 hours as needed  7. Hiatal hernia with GERD without esophagitis: is stable will continue nexium 40 mg twice daily   8. DM type 2 with peripheral neuropathy is stable hgb a1c 6.8 lantus 8 units daily asa 81 mg daily is on statin  9. CKD stage 3 due to type 2 diabetes mellitus: is stable bun 104 creat 3.12 will monitor   10. 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   11.  Iron deficiency anemia due to chronic blood loss: is stable hgb 8.7 will monitor   12. Dyslipidemia associated with type 2 diabetes mellitus: is stable will continue lipitor 40 gm daily   13. Chronic gout 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 unspecified site is stable no recent flares will continue allopurinol 100 mg twice daily   14. Urinary retention: is stable will continue flomax 0.4 mg daily   15. Protein calorie malnutrition: severe: is stable albumin 2.4 weight is 184 pounds will continue supplements as directed    Will check cbc; 07-12-19 will begin daily weights       MD is aware of resident's narcotic use and is in agreement with current plan of care. We will attempt to wean resident as appropriate.  Ok Edwards NP [facility]  Contact [phone] Monday through Friday 8am- 5pm  After hours call [phone]

## 2019-07-10 DIAGNOSIS — R630 Anorexia: Secondary | ICD-10-CM | POA: Insufficient documentation

## 2019-07-11 ENCOUNTER — Other Ambulatory Visit (HOSPITAL_COMMUNITY)
Admission: RE | Admit: 2019-07-11 | Discharge: 2019-07-11 | Disposition: A | Discharge status: Home / Self Care | Payer: Medicare Other | Source: Ambulatory Visit | Attending: Internal Medicine | Admitting: Internal Medicine

## 2019-07-11 ENCOUNTER — Other Ambulatory Visit: Payer: Self-pay | Admitting: Internal Medicine

## 2019-07-11 DIAGNOSIS — Z20828 Contact with and (suspected) exposure to other viral communicable diseases: Secondary | ICD-10-CM | POA: Insufficient documentation

## 2019-07-11 DIAGNOSIS — Z9189 Other specified personal risk factors, not elsewhere classified: Secondary | ICD-10-CM

## 2019-07-13 ENCOUNTER — Encounter: Payer: Self-pay | Admitting: Cardiology

## 2019-07-13 ENCOUNTER — Telehealth (INDEPENDENT_AMBULATORY_CARE_PROVIDER_SITE_OTHER): Payer: Medicare Other | Admitting: Family Medicine

## 2019-07-13 ENCOUNTER — Encounter: Payer: Self-pay | Admitting: Internal Medicine

## 2019-07-13 VITALS — BP 125/76 | HR 89 | Temp 98.1°F | Resp 20 | Ht <= 58 in | Wt 191.2 lb

## 2019-07-13 DIAGNOSIS — I4891 Unspecified atrial fibrillation: Secondary | ICD-10-CM

## 2019-07-13 DIAGNOSIS — I25810 Atherosclerosis of coronary artery bypass graft(s) without angina pectoris: Secondary | ICD-10-CM

## 2019-07-13 DIAGNOSIS — I5032 Chronic diastolic (congestive) heart failure: Secondary | ICD-10-CM | POA: Diagnosis not present

## 2019-07-13 DIAGNOSIS — I110 Hypertensive heart disease with heart failure: Secondary | ICD-10-CM | POA: Diagnosis not present

## 2019-07-13 DIAGNOSIS — I10 Essential (primary) hypertension: Secondary | ICD-10-CM

## 2019-07-13 LAB — SARS CORONAVIRUS 2 (TAT 6-24 HRS): SARS Coronavirus 2: NEGATIVE

## 2019-07-13 NOTE — Patient Instructions (Signed)
Medication Instructions:   Your physician has recommended you make the following change in your medication:   Stop aspirin  Continue other medications the same  Labwork:  NONE  Testing/Procedures:  NONE  Follow-Up:  Your physician recommends that you schedule a follow-up appointment in: 4 months (office if home) (virtual if in rehab)  Any Other Special Instructions Will Be Listed Below (If Applicable).  If you need a refill on your cardiac medications before your next appointment, please call your pharmacy.

## 2019-07-13 NOTE — Progress Notes (Signed)
: Provider:  Hennie Duos., MD Location:  [place] Room Number: 159-P Place of Service:  SNF (31)  PCP: [name], MD Patient Care Team: [name], MD as PCP - General (Family Medicine) Satira Sark, MD as PCP - Cardiology (Cardiology) Elsie Stain, MD (Pulmonary Disease) [name], MD as Consulting Physician (Gastroenterology)  Extended Emergency Contact Information Primary Emergency Contact: Denny Peon, [place] Phone: [phone] Mobile Phone: [phone] Relation: Son Secondary Emergency Contact: [place] Mobile Phone: [phone] Relation: Son     Allergies: Patient has no known allergies.  Chief Complaint  Patient presents with   Readmit To SNF    Readmission to [facility]    HPI: Patient is a 73 y.o. female with diastolic congestive heart failure, CKD 3, OSA on CPAP, COPD, atrial fib, CAD, diabetes mellitus type 2, and hypertension who was brought to the emergency department of [facility] for reports of altered mental status.  Patient son reported patient was in the ED the day prior for similar complaints.  Patient reports some worsening difficulty breathing but unable to tell duration.  Patient has chronic lower extremity swelling that was improved compared to prior.  O2 sats 96% on 2 L, on room air O2 sats 85, chest x-ray with pulmonary vascular congestion.  Patient is admitted to [facility] from 11/30-12/3 where she was treated for mild acute congestive heart failure with IV Lasix with good response and Enterobacter UTI treated with fosfomycin.  She did have acute on chronic CKD 3B and patient's torsemide dose was lowered.  Patient is admitted to skilled nursing facility for OT PT.  While at skilled nursing facility patient will be followed for atrial fibrillation with Eliquis and metoprolol, hypertension treated with diltiazem hydralazine  metoprolol and Demadex and hyperlipidemia treated with Lipitor.  Past Medical History:  Diagnosis Date   Blood transfusion without reported diagnosis    CAD (coronary artery disease)    Nonobstructive at cardiac catheterization 2000   Cataract    Cervical cancer ([place]) 1978   CHF (congestive heart failure) (HCC)    Chronic back pain    Chronic kidney disease    COPD (chronic obstructive pulmonary disease) (HCC)    Degenerative disc disease    Essential hypertension, benign    GERD (gastroesophageal reflux disease)    Gout    Hiatal hernia 07/27/2013   History of diverticulitis of colon    History of hiatal hernia    Iron deficiency anemia    Irritable bowel syndrome    Lumbar radiculopathy    Mixed hyperlipidemia    Neuropathy    Osteoporosis    Ovarian cancer ([place]) 1978   patient denies. States this was cervical cancer   Oxygen deficiency    Sleep apnea    Type 2 diabetes mellitus ([place])    Vitamin B deficiency 12/25/2009   Vitamin B12 deficiency     Past Surgical History:  Procedure Laterality Date   ABDOMINAL HYSTERECTOMY     BACK SURGERY     Benign breast cysts     CHOLECYSTECTOMY     COLONOSCOPY  10/01/2006   SLF:Pan colonic diverticulosis and moderate internal hemorrhoids/ Otherwise no polyps, masses, inflammatory changes or AVMs/   COLONOSCOPY  2011   SLF: pancolonic diverticulosis, large internal hemorrhoids   COLONOSCOPY N/A 01/26/2016   Procedure: COLONOSCOPY;  Surgeon: [name], MD;  Location: [facility];  Service: Endoscopy;  Laterality: N/A;  830    ESOPHAGOGASTRODUODENOSCOPY  11/19/2006   SLF: Large hiatal hernia without evidence of Cameron ulcers/. Distal esophageal stricture, which allowed the gastroscope to pass without resistance.  A 16 mm Savary later passed with mild resistance/ Normal stomach.sb bx negative   ESOPHAGOGASTRODUODENOSCOPY  10/01/2006   OXB:DZHGD hiatal hernia.  Distal esophagus without  evidence of   erythema, ulceration or Barrett's esophagus   ESOPHAGOGASTRODUODENOSCOPY  2011   SLF: large hh, distal esophageal web narrowing to 62mm s/p dilation to 55mm   ESOPHAGOGASTRODUODENOSCOPY N/A 08/06/2013   SLF: 1. Stricture at the gastroesophageal junction 2. large hiatal hernia. 3. Mild erosive gastritis.   GIVENS CAPSULE STUDY N/A 08/06/2013   INCOMPLETE-SMALL BOWLE ULCERS   KNEE SURGERY Right    PARTIAL HYSTERECTOMY  1978   small bowel capsule  2008   negative   SPINE SURGERY     TONSILLECTOMY AND ADENOIDECTOMY     Two back surgeries/fusion     UMBILICAL HERNIA REPAIR  2010    Allergies as of 07/14/2019   No Known Allergies     Medication List    Notice   This visit is during an admission. Changes to the med list made in this visit will be reflected in the After Visit Summary of the admission.    No current facility-administered medications on file prior to visit.   Current Outpatient Medications on File Prior to Visit  Medication Sig Dispense Refill   albuterol (PROVENTIL) (2.5 MG/3ML) 0.083% nebulizer solution Take 2.5 mg by nebulization every 6 (six) hours as needed for wheezing or shortness of breath.      albuterol (VENTOLIN HFA) 108 (90 Base) MCG/ACT inhaler Inhale 2 puffs into the lungs every 6 (six) hours as needed for wheezing or shortness of breath.      allopurinol (ZYLOPRIM) 100 MG tablet Take 100 mg by mouth 2 (two) times daily.      apixaban (ELIQUIS) 5 MG TABS tablet Take 1 tablet (5 mg total) by mouth 2 (two) times daily. 60 tablet    aspirin EC 81 MG tablet Take 81 mg by mouth daily.     atorvastatin (LIPITOR) 40 MG tablet Take 1 tablet (40 mg total) by mouth at bedtime. 90 tablet 1   Biotin 5 MG CAPS Take 5 mg by mouth daily.     diltiazem (CARDIZEM CD) 120 MG 24 hr capsule Take 1 capsule (120 mg total) by mouth daily.     esomeprazole (NEXIUM) 40 MG capsule TAKE 1 CAPSULE TWICE A DAY 30 MINUTES PRIOR TO MEALS 180 capsule 1    famotidine (PEPCID) 40 MG tablet TAKE 1 TABLET AT BEDTIME AS NEEDED TO CONTROL REFLUX (Patient not taking: Reported on 07/19/2019) 90 tablet 1   feeding supplement, GLUCERNA SHAKE, (GLUCERNA SHAKE) LIQD Take 237 mLs by mouth 2 (two) times daily between meals.     hydrALAZINE (APRESOLINE) 25 MG tablet Take 25 mg by mouth 2 (two) times daily.     LANTUS SOLOSTAR 100 UNIT/ML SOPN Inject 8 Units into the skin at bedtime.      loperamide (IMODIUM) 2 MG capsule Take 2 mg by mouth every 6 (six) hours as needed for diarrhea or loose stools.      metoprolol succinate (TOPROL-XL) 25 MG 24 hr tablet Take 25 mg by mouth daily. Along with 50 mg to = 75 mg     metoprolol succinate (TOPROL-XL) 50 MG 24 hr tablet Take 50  mg by mouth daily. Along with 25 mg to = 75 mg     OXYGEN Inhale 2 L into the lungs. Every Shift     potassium chloride SA (KLOR-CON) 10 MEQ tablet Take 1 tablet (10 mEq total) by mouth every other day.     tamsulosin (FLOMAX) 0.4 MG CAPS capsule Take 1 capsule (0.4 mg total) by mouth daily. 30 capsule    torsemide (DEMADEX) 10 MG tablet Take 5 tablets (50 mg total) by mouth daily.     TRELEGY ELLIPTA 100-62.5-25 MCG/INH AEPB Inhale 1 puff into the lungs daily.       No orders of the defined types were placed in this encounter.   Immunization History  Administered Date(s) Administered   Influenza Split 05/14/2011, 05/05/2013   Influenza,inj,Quad PF,6+ Mos 06/20/2017, 05/05/2018, 05/27/2019   Influenza-Unspecified 05/14/2011, 05/05/2013   Pneumococcal Conjugate-13 03/07/2014   Pneumococcal Polysaccharide-23 08/06/2007, 06/30/2013   Tdap 08/13/2018    Social History   Tobacco Use   Smoking status: Former Smoker    Packs/day: 1.00    Years: 1.00    Pack years: 1.00    Types: Cigarettes    Start date: 02/19/1961    Quit date: 08/05/1961    Years since quitting: 57.9   Smokeless tobacco: Never Used   Tobacco comment: Quit smoking x 50 years  Substance Use Topics     Alcohol use: No    Family history is   Family History  Problem Relation Age of Onset   Colon cancer Brother        diagnosed age 22. Living.    Ulcers Sister    Diabetes Sister    Heart attack Sister    Kidney failure Sister    Stroke Sister    Ulcers Mother    Diabetes Mother    Heart attack Mother    Stroke Mother    Asthma Mother    Heart disease Mother    Cervical cancer Mother    Heart attack Brother    Heart disease Brother    Asthma Sister    Diabetes Brother    Stroke Maternal Grandmother    Heart attack Maternal Grandmother    Heart attack Other    Early death Father        MVA in his 39s      Review of Systems  GENERAL:  no fevers, fatigue, appetite changes SKIN: No itching, or rash EYES: No eye pain, redness, discharge EARS: No earache, tinnitus, change in hearing NOSE: No congestion, drainage or bleeding  MOUTH/THROAT: No mouth or tooth pain, No sore throat RESPIRATORY: No cough, wheezing, SOB CARDIAC: No chest pain, palpitations, lower extremity edema  GI: No abdominal pain, No N/V/D or constipation, No heartburn or reflux  GU: No dysuria, frequency or urgency, or incontinence  MUSCULOSKELETAL: No unrelieved bone/joint pain NEUROLOGIC: No headache, dizziness or focal weakness PSYCHIATRIC: No c/o anxiety or sadness   Vitals:   07/13/19 1627  BP: 98/64  Pulse: 84  Resp: 20  Temp: (!) 97.1 F (36.2 C)  SpO2: 96%    SpO2 Readings from Last 1 Encounters:  07/13/19 96%   Body mass index is 43.36 kg/m.     Physical Exam  GENERAL APPEARANCE: Alert, conversant,  No acute distress.  SKIN: No diaphoresis rash HEAD: Normocephalic, atraumatic  EYES: Conjunctiva/lids clear. Pupils round, reactive. EOMs intact.  EARS: External exam WNL, canals clear. Hearing grossly normal.  NOSE: No deformity or discharge.  MOUTH/THROAT: Lips w/o lesions  RESPIRATORY: Breathing is even, unlabored. Lung sounds are clear    CARDIOVASCULAR: Heart RRR no murmurs, rubs or gallops.  Some peripheral edema.   GASTROINTESTINAL: Abdomen is soft, non-tender, not distended w/ normal bowel sounds. GENITOURINARY: Bladder non tender, not distended  MUSCULOSKELETAL: No abnormal joints or musculature NEUROLOGIC:  Cranial nerves 2-12 grossly intact. 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 ([place]) 06/22/2019   CKD stage 3 due to type 2 diabetes mellitus ([place]) 06/22/2019   Neuropathy due to type 2 diabetes mellitus ([place]) 06/22/2019   Dyslipidemia associated with type 2 diabetes mellitus ([place]) 06/22/2019   Chronic gout 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 06/22/2019   Quality of life palliative care encounter 06/19/2019   Atrial fibrillation with RVR (HCC)    Atrial flutter with rapid ventricular response ([place]) 06/16/2019   Dyspnea and respiratory abnormalities 06/01/2019   Acute on chronic respiratory failure with hypoxia and hypercapnia with respiratory acidosis 06/01/2019   Anasarca 05/31/2019   Nausea without vomiting 05/31/2019   Generalized abdominal pain 05/31/2019   History of colonic polyps 05/31/2019   Lymphedema of both lower extremities 04/28/2019   Chronic respiratory failure with hypoxia ([place]) 07/13/2018   Acute renal failure superimposed on stage 3 chronic kidney disease ([place]) 07/13/2018   Chronic diastolic CHF (congestive heart failure) ([place]) 07/13/2018   COPD (chronic obstructive pulmonary disease) ([place]) 07/13/2018   COPD exacerbation ([place]) 07/12/2018    Flatulence 09/18/2017   IBS (irritable bowel syndrome) 04/03/2017   Gout 03/15/2017   Acute on chronic diastolic CHF (congestive heart failure) ([place]) 05/08/2016   CKD (chronic kidney disease) stage 3, GFR 30-59 ml/min 05/08/2016   COPD with acute exacerbation ([place]) 05/08/2016   Constipation 04/25/2016   Peripheral vertigo 06/27/2015   Port-A-Cath in place 10/06/2014   Chronic obstructive pulmonary disease ([place]) 04/30/2014   Obesity, Class III, BMI 40-49.9 (morbid obesity) ([place]) 04/30/2014   Hemorrhoids, internal, with bleeding 11/10/2013   Anemia 08/18/2013   Hiatal hernia with GERD and esophagitis    Obstructive chronic bronchitis without exacerbation ([place]) 12/26/2009   Dysphagia 12/26/2009   DM type 2 with diabetic peripheral neuropathy ([place]) 12/25/2009   Vitamin B deficiency 12/25/2009   Iron deficiency anemia due to chronic blood loss 12/25/2009   Benign essential HTN 12/25/2009   DEGENERATIVE DISC DISEASE 12/25/2009   OSA on CPAP 12/25/2009   HLD (hyperlipidemia) 12/25/2009   Type 2 diabetes with nephropathy ([place]) 12/25/2009      Labs reviewed: Basic Metabolic Panel:    Component Value Date/Time   NA 143 07/08/2019 0502   K 4.2 07/08/2019 0502   CL 94 (L) 07/08/2019 0502   CO2 36 (H) 07/08/2019 0502   GLUCOSE 125 (H) 07/08/2019 0502   BUN 105 (H) 07/08/2019 0502   CREATININE 2.87 (H) 07/08/2019 0502   CREATININE 1.68 (H) 10/10/2017 1050   CALCIUM 8.1 (L) 07/08/2019 0502   PROT 7.0 07/04/2019 1132   ALBUMIN 2.4 (L) 07/08/2019 0502   AST 18 07/04/2019 1132   ALT 11 07/04/2019 1132   ALKPHOS 70 07/04/2019 1132   BILITOT 1.1 07/04/2019 1132  GFRNONAA 16 (L) 07/08/2019 0502   GFRNONAA 30 (L) 10/10/2017 1050   GFRAA 18 (L) 07/08/2019 0502   GFRAA 35 (L) 10/10/2017 1050    Recent Labs    06/05/19 0446  06/17/19 0916 06/18/19 0505  07/06/19 0447 07/07/19 0448 07/08/19 0502  NA 143   < > 141 142   < > 145 143 143  K 3.9   < > 4.1 3.9    < > 3.5 3.5 4.2  CL 102   < > 96* 97*   < > 96* 95* 94*  CO2 30   < > 31 33*   < > 37* 36* 36*  GLUCOSE 107*   < > 122* 118*   < > 104* 112* 125*  BUN 55*   < > 65* 65*   < > 120* 104* 105*  CREATININE 2.74*   < > 2.63* 2.60*   < > 3.95* 3.12* 2.87*  CALCIUM 7.6*   < > 8.1* 7.9*   < > 8.0* 8.1* 8.1*  MG  --   --  1.7 1.5*  --   --   --   --   PHOS 6.1*  --   --   --   --   --  2.9 2.7   < > = values in this interval not displayed.   Liver Function Tests: Recent Labs    05/31/19 1658  07/04/19 1132 07/07/19 0448 07/08/19 0502  AST 14*  --  18  --   --   ALT 10  --  11  --   --   ALKPHOS 71  --  70  --   --   BILITOT 1.0  --  1.1  --   --   PROT 7.6  --  7.0  --   --   ALBUMIN 3.8   < > 2.7* 2.4* 2.4*   < > = values in this interval not displayed.   Recent Labs    05/31/19 1658  LIPASE 38   No results for input(s): AMMONIA in the last 8760 hours. CBC: Recent Labs    11/22/18 2111  07/04/19 1132 07/05/19 1011 07/06/19 0447 07/07/19 0448  WBC 8.2   < > 9.9 9.3 11.1* 11.5*  NEUTROABS 5.9  --  7.9* 7.1  --   --   HGB 10.8*   < > 10.0* 8.9* 8.3* 8.7*  HCT 35.1*   < > 33.7* 29.9* 27.8* 29.3*  MCV 86.2   < > 91.6 90.3 90.0 89.3  PLT 304   < > 355 333 374 379   < > = values in this interval not displayed.   Lipid No results for input(s): CHOL, HDL, LDLCALC, TRIG in the last 8760 hours.  Cardiac Enzymes: No results for input(s): CKTOTAL, CKMB, CKMBINDEX, TROPONINI in the last 8760 hours. BNP: Recent Labs    05/31/19 1658 06/15/19 1347 07/05/19 1359  BNP 206.0* 327.0* 321.0*   No results found for: [facility] Lab Results  Component Value Date   HGBA1C 6.8 (H) 07/05/2019   No results found for: TSH Lab Results  Component Value Date   VITAMINB12 451 08/28/2017   Lab Results  Component Value Date   FOLATE 9.1 08/28/2017   Lab Results  Component Value Date   IRON 67 08/28/2017   TIBC 290 08/28/2017   FERRITIN 97 08/28/2017    Imaging and Procedures  obtained prior to SNF admission: No results found.   Not all labs, radiology exams  or other studies done during hospitalization come through on my EPIC note; however they are reviewed by me.    Assessment and Plan  Acute on chronic congestive heart failure acute on CKD 3B -patient responded well to IV Lasix; patient started back on lower dose of Demadex secondary to creatinine SNF-admitted for OT/PT; will follow-up BMP continue Demadex 50 mg daily  Enterobacter UTI-treated with fosfomycin and treatment completed  Paroxysmal atrial fibrillation SNF-rate controlled; continue Cardizem 120 daily and metoprolol 75 mg daily with Eliquis 5 mg twice daily as prophylaxis  Altered mental status-resolved; felt secondary to UTI  Hypertension SNF-controlled on multiple agents including diltiazem 120 mg daily, hydralazine 25 mg twice daily, metoprolol XL 75 mg daily, and Demadex 10 mg daily   Hyperlipidemia SNF-not stated as uncontrolled; continue Lipitor 40 mg daily  Gout SNF-appears controlled; continue allopurinol 100 mg twice daily   Time spent greater than 45 minutes;> 50% of time with patient was spent reviewing records, labs, tests and studies, counseling and developing plan of care  Hennie Duos, MD

## 2019-07-13 NOTE — Progress Notes (Signed)
Virtual Visit via Telephone Note   This visit type was conducted due to national recommendations for restrictions regarding the COVID-19 Pandemic (e.g. social distancing) in an effort to limit this patient's exposure and mitigate transmission in our community.  Due to her co-morbid illnesses, this patient is at least at moderate risk for complications without adequate follow up.  This format is felt to be most appropriate for this patient at this time.  The patient did not have access to video technology/had technical difficulties with video requiring transitioning to audio format only (telephone).  All issues noted in this document were discussed and addressed.  No physical exam could be performed with this format.  Please refer to the patient's chart for her  consent to telehealth for [facility].   Date:  07/13/2019   ID:  [name], DOB 26-Feb-1946, MRN [id number]  Patient Location: [facility] [place] Provider Location: Office  PCP:  [name], MD  Cardiologist:  [name], MD  Electrophysiologist:  None   Evaluation Performed:  Follow-Up Visit  Chief Complaint: Follow-up CAD, hypertension, hyperlipidemia, diastolic dysfunction  History of Present Illness:    [name] is a 73 y.o. female with history of coronary artery disease nonobstructive by cath in 2000, CHF, chronic kidney disease, hypertension, GERD, iron deficiency anemia, IBS, hyperlipidemia, type 2 diabetes, COPD, history of diastolic dysfunction in 4128 by echo.  She had a recent hospital admission from November 10-16 for CHF/A.Flutter  She had a subsequent hospital visit on November 30 due to shortness of breath, paroxysmal atrial flutter, and altered mental status.  She was admitted to the hospital for suspicion of CHF,elevated troponins, UTI.  Serial troponins demonstrated levels at 29, 44, 53, BMP of 321. She saw Dr Harl Bowie and started on Eliquis 5 mg daily. She was transitioned from  lasix to torsemide during this visit by nephrology.  Last seen March 16, 2019.  At that visit she had audible wheezing.  Patient stated wheezing limited her activity.  At that time she had not seen her pulmonologist in over a year.  She was experiencing a nonproductive cough.  She uses inhalers and nebulizers and stated they were not working very well.  She uses a walker to ambulate.  She has history of lymphedema and uses a mechanical device at home.  She had been going to lymphedema clinic but missed several visits due to Covid pandemic  She had a recent echocardiogram on June 16, 2019 results are below.  She had grade 2 diastolic dysfunction.  EF 55 to 60%.  Moderate pulmonary hypertension with pulmonary artery systolic pressure 48 mmHg  The patient does have symptoms concerning for COVID-19 infection (fever, chills, cough, or new shortness of breath).    Past Medical History:  Diagnosis Date  . Blood transfusion without reported diagnosis   . CAD (coronary artery disease)    Nonobstructive at cardiac catheterization 2000  . Cataract   . Cervical cancer ([place]) 1978  . CHF (congestive heart failure) ([place])   . Chronic back pain   . Chronic kidney disease   . COPD (chronic obstructive pulmonary disease) ([place])   . Degenerative disc disease   . Essential hypertension, benign   . GERD (gastroesophageal reflux disease)   . Gout   . Hiatal hernia 07/27/2013  . History of diverticulitis of colon   . History of hiatal hernia   . Iron deficiency anemia   . Irritable bowel syndrome   . Lumbar radiculopathy   .  Mixed hyperlipidemia   . Neuropathy   . Osteoporosis   . Ovarian cancer [facility]) 1978   patient denies. States this was cervical cancer  . Oxygen deficiency   . Sleep apnea   . Type 2 diabetes mellitus ([place])   . Vitamin B deficiency 12/25/2009  . Vitamin B12 deficiency    Past Surgical History:  Procedure Laterality Date  . ABDOMINAL HYSTERECTOMY    . BACK SURGERY    .  Benign breast cysts    . CHOLECYSTECTOMY    . COLONOSCOPY  10/01/2006   SLF:Pan colonic diverticulosis and moderate internal hemorrhoids/ Otherwise no polyps, masses, inflammatory changes or AVMs/  . COLONOSCOPY  2011   SLF: pancolonic diverticulosis, large internal hemorrhoids  . COLONOSCOPY N/A 01/26/2016   Procedure: COLONOSCOPY;  Surgeon: [name], MD;  Location: [facility];  Service: Endoscopy;  Laterality: N/A;  830   . ESOPHAGOGASTRODUODENOSCOPY  11/19/2006   SLF: Large hiatal hernia without evidence of Cameron ulcers/. Distal esophageal stricture, which allowed the gastroscope to pass without resistance.  A 16 mm Savary later passed with mild resistance/ Normal stomach.sb bx negative  . ESOPHAGOGASTRODUODENOSCOPY  10/01/2006   VFI:EPPIR hiatal hernia.  Distal esophagus without evidence of   erythema, ulceration or Barrett's esophagus  . ESOPHAGOGASTRODUODENOSCOPY  2011   SLF: large hh, distal esophageal web narrowing to 70mm s/p dilation to 41mm  . ESOPHAGOGASTRODUODENOSCOPY N/A 08/06/2013   SLF: 1. Stricture at the gastroesophageal junction 2. large hiatal hernia. 3. Mild erosive gastritis.  Marland Kitchen GIVENS CAPSULE STUDY N/A 08/06/2013   INCOMPLETE-SMALL BOWLE ULCERS  . KNEE SURGERY Right   . PARTIAL HYSTERECTOMY  1978  . small bowel capsule  2008   negative  . SPINE SURGERY    . TONSILLECTOMY AND ADENOIDECTOMY    . Two back surgeries/fusion    . UMBILICAL HERNIA REPAIR  2010     Current Meds  Medication Sig  . albuterol (PROVENTIL) (2.5 MG/3ML) 0.083% nebulizer solution Take 2.5 mg by nebulization every 6 (six) hours as needed for wheezing or shortness of breath.   Marland Kitchen albuterol (VENTOLIN HFA) 108 (90 Base) MCG/ACT inhaler Inhale 2 puffs into the lungs every 6 (six) hours as needed for wheezing or shortness of breath.   . allopurinol (ZYLOPRIM) 100 MG tablet Take 100 mg by mouth 2 (two) times daily.   Marland Kitchen apixaban (ELIQUIS) 5 MG TABS tablet Take 1 tablet (5 mg total) by mouth 2  (two) times daily.  Marland Kitchen aspirin EC 81 MG tablet Take 81 mg by mouth daily.  Marland Kitchen atorvastatin (LIPITOR) 40 MG tablet Take 1 tablet (40 mg total) by mouth at bedtime.  . Biotin 5 MG CAPS Take 5 mg by mouth daily.  Marland Kitchen diltiazem (CARDIZEM CD) 120 MG 24 hr capsule Take 1 capsule (120 mg total) by mouth daily.  Marland Kitchen esomeprazole (NEXIUM) 40 MG capsule TAKE 1 CAPSULE TWICE A DAY 30 MINUTES PRIOR TO MEALS  . famotidine (PEPCID) 40 MG tablet TAKE 1 TABLET AT BEDTIME AS NEEDED TO CONTROL REFLUX  . feeding supplement, GLUCERNA SHAKE, (GLUCERNA SHAKE) LIQD Take 237 mLs by mouth 2 (two) times daily between meals.  . hydrALAZINE (APRESOLINE) 25 MG tablet Take 25 mg by mouth 2 (two) times daily.  Marland Kitchen LANTUS SOLOSTAR 100 UNIT/ML SOPN Inject 8 Units into the skin at bedtime.   Marland Kitchen loperamide (IMODIUM) 2 MG capsule Take 2 mg by mouth every 6 (six) hours as needed for diarrhea or loose stools.   . metoprolol succinate (  TOPROL-XL) 25 MG 24 hr tablet Take 25 mg by mouth daily. Along with 50 mg to = 75 mg  . metoprolol succinate (TOPROL-XL) 50 MG 24 hr tablet Take 50 mg by mouth daily. Along with 25 mg to = 75 mg  . NON FORMULARY Diet: Regular, NAS Consistent Carbohydrate  . OXYGEN Inhale 2 L into the lungs. Every Shift  . potassium chloride SA (KLOR-CON) 10 MEQ tablet Take 1 tablet (10 mEq total) by mouth every other day.  . tamsulosin (FLOMAX) 0.4 MG CAPS capsule Take 1 capsule (0.4 mg total) by mouth daily.  Marland Kitchen torsemide (DEMADEX) 10 MG tablet Take 5 tablets (50 mg total) by mouth daily.  . TRELEGY ELLIPTA 100-62.5-25 MCG/INH AEPB Inhale 1 puff into the lungs daily.     Allergies:   Patient has no known allergies.   Social History   Tobacco Use  . Smoking status: Former Smoker    Packs/day: 1.00    Years: 1.00    Pack years: 1.00    Types: Cigarettes    Start date: 02/19/1961    Quit date: 08/05/1961    Years since quitting: 57.9  . Smokeless tobacco: Never Used  . Tobacco comment: Quit smoking x 50 years   Substance Use Topics  . Alcohol use: No  . Drug use: No     Family Hx: The patient's family history includes Asthma in her mother and sister; Cervical cancer in her mother; Colon cancer in her brother; Diabetes in her brother, mother, and sister; Early death in her father; Heart attack in her brother, maternal grandmother, mother, sister, and another family member; Heart disease in her brother and mother; Kidney failure in her sister; Stroke in her maternal grandmother, mother, and sister; Ulcers in her mother and sister.  ROS:   Please see the history of present illness.    All other systems reviewed and are negative.   Prior CV studies:   The following studies were reviewed today:  Echocardiogram June 16, 2019 Left ventricular ejection fraction, by visual estimation, is 55 to 60%. The left ventricle has normal function. There is no left ventricular hypertrophy. 2. Elevated left atrial pressure. 3. Left ventricular diastolic parameters are consistent with Grade II diastolic dysfunction (pseudonormalization). 4. Global right ventricle has normal systolic function.The right ventricular size is normal. No increase in right ventricular wall thickness. 5. Left atrial size was moderately dilated. 6. Right atrial size was normal. 7. The mitral valve was not well visualized. Trace mitral valve regurgitation. No evidence of mitral stenosis. 8. The tricuspid valve is normal in structure. Tricuspid valve regurgitation is mild. 9. The aortic valve was not well visualized. Aortic valve regurgitation is not visualized. No evidence of aortic valve sclerosis or stenosis. 10. The pulmonic valve was not well visualized. Pulmonic valve regurgitation not assessed. 11. The aortic root was not well visualized. 12. Moderately elevated pulmonary artery systolic pressure. 13. Moderate pulmonary HTN, PASP is 48 mmHg. 14. The inferior vena cava is normal in size with greater than 50% respiratory  variability, suggesting right atrial pressure of 3 mmHg. 15. The interatrial septum was not well visualized  Labs/Other Tests and Data Reviewed:    EKG:  An ECG dated November 30th, 2020 was personally reviewed today and demonstrated:  Sinus rhythm 91 incomplete left bundle branch block.  Borderline ST elevation in anterior leads  Recent Labs: 06/18/2019: Magnesium 1.5 07/04/2019: ALT 11 07/05/2019: B Natriuretic Peptide 321.0 07/07/2019: Hemoglobin 8.7; Platelets 379 07/08/2019: BUN 105; Creatinine, Ser  2.87; Potassium 4.2; Sodium 143   Recent Lipid Panel Lab Results  Component Value Date/Time   CHOL 257 (H) 10/10/2017 10:50 AM   TRIG 177 (H) 10/10/2017 10:50 AM   HDL 38 (L) 10/10/2017 10:50 AM   CHOLHDL 6.8 (H) 10/10/2017 10:50 AM   LDLCALC 186 (H) 10/10/2017 10:50 AM    Wt Readings from Last 3 Encounters:  07/13/19 191 lb 3.2 oz (86.7 kg)  07/09/19 193 lb 6.4 oz (87.7 kg)  07/08/19 198 lb 10.2 oz (90.1 kg)     Objective:    Vital Signs:  BP 125/76   Pulse 89   Temp 98.1 F (36.7 C)   Resp 20   Ht 4\' 8"  (1.422 m)   Wt 191 lb 3.2 oz (86.7 kg)   BMI 42.87 kg/m    VITAL SIGNS:  reviewed  ASSESSMENT & PLAN:    1. Chronic diastolic CHF (congestive heart failure) [facility]) Recent admission to [facility] for mild acute CHF exacerbation.  She was started on torsemide 50 mg daily by nephrology and condition improved.  Recent echocardiogram showed grade 2 diastolic dysfunction in November.  Patient was discharged on torsemide 50 mg daily which she continues to take.  Sees nephrology who prescribed torsemide in hospital.   2. Essential (primary) hypertension Blood pressure within normal limits today at 120/76.  She is currently taking Toprol XL 75 mg daily. Tamsulosin 0.4 mg daily. Hydralazine 25 mg bid.  Continue current therapy.    3. Coronary artery disease involving coronary bypass graft of native heart without angina pectoris Patient denies any current  progressive anginal or exertional symptoms. She is resident of [facility] currently for rehab.  4. Atrial fibrillation with RVR (HCC) Denies any palpitations or arrhythmias.  Heart rate controlled at 89 Continue Cardizem CD 120 mg daily.  Continue Eliquis 5 mg p.o. twice daily.  Recent renal function showed BUN of 105, creatinine 2.87, GFR 16.  Stop ASA.   COVID-19 Education: The signs and symptoms of COVID-19 were discussed with the patient and how to seek care for testing (follow up with PCP or arrange E-visit). The importance of social distancing was discussed today.  Time:   Today, I have spent 15 minutes with the patient with telehealth technology discussing the above problems.     Medication Adjustments/Labs and Tests Ordered: Current medicines are reviewed at length with the patient today.  Concerns regarding medicines are outlined above.   Tests Ordered: No orders of the defined types were placed in this encounter.   Medication Changes: No orders of the defined types were placed in this encounter.   Follow Up:  Either In Person or Virtual in 4 month(s)  Signed, Verta Ellen, NP  07/13/2019 11:46 AM    [place]

## 2019-07-14 ENCOUNTER — Encounter: Payer: Self-pay | Admitting: Internal Medicine

## 2019-07-14 ENCOUNTER — Non-Acute Institutional Stay (SKILLED_NURSING_FACILITY): Payer: Medicare Other | Admitting: Internal Medicine

## 2019-07-14 DIAGNOSIS — I5033 Acute on chronic diastolic (congestive) heart failure: Secondary | ICD-10-CM | POA: Diagnosis not present

## 2019-07-14 DIAGNOSIS — N1832 Chronic kidney disease, stage 3b: Secondary | ICD-10-CM

## 2019-07-14 DIAGNOSIS — N390 Urinary tract infection, site not specified: Secondary | ICD-10-CM

## 2019-07-14 DIAGNOSIS — A498 Other bacterial infections of unspecified site: Secondary | ICD-10-CM | POA: Diagnosis not present

## 2019-07-14 DIAGNOSIS — I5032 Chronic diastolic (congestive) heart failure: Secondary | ICD-10-CM

## 2019-07-14 DIAGNOSIS — I130 Hypertensive heart and chronic kidney disease with heart failure and stage 1 through stage 4 chronic kidney disease, or unspecified chronic kidney disease: Secondary | ICD-10-CM

## 2019-07-14 DIAGNOSIS — I480 Paroxysmal atrial fibrillation: Secondary | ICD-10-CM | POA: Diagnosis not present

## 2019-07-14 DIAGNOSIS — M1A00X0 Idiopathic chronic gout, unspecified site, without tophus (tophi): Secondary | ICD-10-CM

## 2019-07-14 DIAGNOSIS — E785 Hyperlipidemia, unspecified: Secondary | ICD-10-CM

## 2019-07-14 DIAGNOSIS — G9340 Encephalopathy, unspecified: Secondary | ICD-10-CM

## 2019-07-14 NOTE — Progress Notes (Signed)
This encounter was created in error - please disregard.

## 2019-07-15 ENCOUNTER — Encounter: Payer: Self-pay | Admitting: Adult Health

## 2019-07-15 ENCOUNTER — Non-Acute Institutional Stay (SKILLED_NURSING_FACILITY): Payer: Medicare Other | Admitting: Adult Health

## 2019-07-15 DIAGNOSIS — F321 Major depressive disorder, single episode, moderate: Secondary | ICD-10-CM

## 2019-07-15 NOTE — Progress Notes (Signed)
Location:    [place] Room Number: 159/P Place of Service:  SNF (31)   CODE STATUS: Full Code  No Known Allergies  Chief Complaint  Patient presents with  . Acute Visit    Depression    HPI:  Staff has reported that her mood state is worse. Her spouse died about one year ago . She is depressed; is having a more difficult time participating in therapy. She feels sad; worries that she will not be able to return back home. Sates is not sleeping well at night denies any suicidal ideation.    Past Medical History:  Diagnosis Date  . Blood transfusion without reported diagnosis   . CAD (coronary artery disease)    Nonobstructive at cardiac catheterization 2000  . Cataract   . Cervical cancer ([place]) 1978  . CHF (congestive heart failure) ([place])   . Chronic back pain   . Chronic kidney disease   . COPD (chronic obstructive pulmonary disease) ([place])   . Degenerative disc disease   . Essential hypertension, benign   . GERD (gastroesophageal reflux disease)   . Gout   . Hiatal hernia 07/27/2013  . History of diverticulitis of colon   . History of hiatal hernia   . Iron deficiency anemia   . Irritable bowel syndrome   . Lumbar radiculopathy   . Mixed hyperlipidemia   . Neuropathy   . Osteoporosis   . Ovarian cancer [facility]) 1978   patient denies. States this was cervical cancer  . Oxygen deficiency   . Sleep apnea   . Type 2 diabetes mellitus ([place])   . Vitamin B deficiency 12/25/2009  . Vitamin B12 deficiency     Past Surgical History:  Procedure Laterality Date  . ABDOMINAL HYSTERECTOMY    . BACK SURGERY    . Benign breast cysts    . CHOLECYSTECTOMY    . COLONOSCOPY  10/01/2006   SLF:Pan colonic diverticulosis and moderate internal hemorrhoids/ Otherwise no polyps, masses, inflammatory changes or AVMs/  . COLONOSCOPY  2011   SLF: pancolonic diverticulosis, large internal hemorrhoids  . COLONOSCOPY N/A 01/26/2016   Procedure: COLONOSCOPY;   Surgeon: [name], MD;  Location: [facility];  Service: Endoscopy;  Laterality: N/A;  830   . ESOPHAGOGASTRODUODENOSCOPY  11/19/2006   SLF: Large hiatal hernia without evidence of [place] ulcers/. Distal esophageal stricture, which allowed the gastroscope to pass without resistance.  A 16 mm Savary later passed with mild resistance/ Normal stomach.sb bx negative  . ESOPHAGOGASTRODUODENOSCOPY  10/01/2006   ZJI:RCVEL hiatal hernia.  Distal esophagus without evidence of   erythema, ulceration or Barrett's esophagus  . ESOPHAGOGASTRODUODENOSCOPY  2011   SLF: large hh, distal esophageal web narrowing to 34mm s/p dilation to 73mm  . ESOPHAGOGASTRODUODENOSCOPY N/A 08/06/2013   SLF: 1. Stricture at the gastroesophageal junction 2. large hiatal hernia. 3. Mild erosive gastritis.  Marland Kitchen GIVENS CAPSULE STUDY N/A 08/06/2013   INCOMPLETE-SMALL BOWLE ULCERS  . KNEE SURGERY Right   . PARTIAL HYSTERECTOMY  1978  . small bowel capsule  2008   negative  . SPINE SURGERY    . TONSILLECTOMY AND ADENOIDECTOMY    . Two back surgeries/fusion    . UMBILICAL HERNIA REPAIR  2010    Social History   Socioeconomic History  . Marital status: Widowed    Spouse name: Flett  . Number of children: 4  . Years of education: Not on file  . Highest education level: 10th grade  Occupational History  .  Occupation: retired    Comment: Ambulance person, [employer] work  Tobacco Use  . Smoking status: Former Smoker    Packs/day: 1.00    Years: 1.00    Pack years: 1.00    Types: Cigarettes    Start date: 02/19/1961    Quit date: 08/05/1961    Years since quitting: 57.9  . Smokeless tobacco: Never Used  . Tobacco comment: Quit smoking x 50 years  Substance and Sexual Activity  . Alcohol use: No  . Drug use: No  . Sexual activity: Not Currently  Other Topics Concern  . Not on file  Social History Narrative   HAS 4 SON-GRAND [place].   Lives in home with Nathaniel - married 13 Y   Cook, sew, quilt, crochet    Social Determinants of Health   Financial Resource Strain:   . Difficulty of Paying Living Expenses: Not on file  Food Insecurity:   . Worried About Charity fundraiser in the Last Year: Not on file  . Ran Out of Food in the Last Year: Not on file  Transportation Needs:   . Lack of Transportation (Medical): Not on file  . Lack of Transportation (Non-Medical): Not on file  Physical Activity:   . Days of Exercise per Week: Not on file  . Minutes of Exercise per Session: Not on file  Stress:   . Feeling of Stress : Not on file  Social Connections:   . Frequency of Communication with Friends and Family: Not on file  . Frequency of Social Gatherings with Friends and Family: Not on file  . Attends Religious Services: Not on file  . Active Member of Clubs or Organizations: Not on file  . Attends Archivist Meetings: Not on file  . Marital Status: Not on file  Intimate Partner Violence:   . Fear of Current or Ex-Partner: Not on file  . Emotionally Abused: Not on file  . Physically Abused: Not on file  . Sexually Abused: Not on file   Family History  Problem Relation Age of Onset  . Colon cancer Brother        diagnosed age 7. Living.   Marland Kitchen Ulcers Sister   . Diabetes Sister   . Heart attack Sister   . Kidney failure Sister   . Stroke Sister   . Ulcers Mother   . Diabetes Mother   . Heart attack Mother   . Stroke Mother   . Asthma Mother   . Heart disease Mother   . Cervical cancer Mother   . Heart attack Brother   . Heart disease Brother   . Asthma Sister   . Diabetes Brother   . Stroke Maternal Grandmother   . Heart attack Maternal Grandmother   . Heart attack Other   . Early death Father        MVA in his 38s      VITAL SIGNS BP (!) 100/50   Pulse 92   Temp 98.3 F (36.8 C) (Oral)   Resp 20   Ht 4\' 8"  (1.422 m)   Wt 189 lb 4.8 oz (85.9 kg)   SpO2 98%   BMI 42.44 kg/m   Outpatient Encounter Medications as of 07/15/2019  Medication Sig  .  albuterol (PROVENTIL) (2.5 MG/3ML) 0.083% nebulizer solution Take 2.5 mg by nebulization every 6 (six) hours as needed for wheezing or shortness of breath.   Marland Kitchen albuterol (VENTOLIN HFA) 108 (90 Base) MCG/ACT inhaler Inhale 2  puffs into the lungs every 6 (six) hours as needed for wheezing or shortness of breath.   . allopurinol (ZYLOPRIM) 100 MG tablet Take 100 mg by mouth 2 (two) times daily.   Marland Kitchen apixaban (ELIQUIS) 5 MG TABS tablet Take 1 tablet (5 mg total) by mouth 2 (two) times daily.  Marland Kitchen aspirin EC 81 MG tablet Take 81 mg by mouth daily.  Marland Kitchen atorvastatin (LIPITOR) 40 MG tablet Take 1 tablet (40 mg total) by mouth at bedtime.  . Biotin 5 MG CAPS Take 5 mg by mouth daily.  . Cholecalciferol 1.25 MG (50000 UT) capsule Take 50,000 Units by mouth. Once a day on Thursday  . diltiazem (CARDIZEM CD) 120 MG 24 hr capsule Take 1 capsule (120 mg total) by mouth daily.  Marland Kitchen esomeprazole (NEXIUM) 40 MG capsule TAKE 1 CAPSULE TWICE A DAY 30 MINUTES PRIOR TO MEALS  . famotidine (PEPCID) 40 MG tablet TAKE 1 TABLET AT BEDTIME AS NEEDED TO CONTROL REFLUX  . feeding supplement, GLUCERNA SHAKE, (GLUCERNA SHAKE) LIQD Take 237 mLs by mouth 2 (two) times daily between meals.  . hydrALAZINE (APRESOLINE) 25 MG tablet Take 25 mg by mouth 2 (two) times daily.  Marland Kitchen LANTUS SOLOSTAR 100 UNIT/ML SOPN Inject 8 Units into the skin at bedtime.   Marland Kitchen loperamide (IMODIUM) 2 MG capsule Take 2 mg by mouth every 6 (six) hours as needed for diarrhea or loose stools.   . metoprolol succinate (TOPROL-XL) 25 MG 24 hr tablet Take 25 mg by mouth daily. Along with 50 mg to = 75 mg  . metoprolol succinate (TOPROL-XL) 50 MG 24 hr tablet Take 50 mg by mouth daily. Along with 25 mg to = 75 mg  . NON FORMULARY Diet: Regular, NAS Consistent Carbohydrate  . OXYGEN Inhale 2 L into the lungs. Every Shift  . potassium chloride SA (KLOR-CON) 10 MEQ tablet Take 1 tablet (10 mEq total) by mouth every other day.  . sertraline (ZOLOFT) 50 MG tablet Take 50 mg  by mouth daily.  . tamsulosin (FLOMAX) 0.4 MG CAPS capsule Take 1 capsule (0.4 mg total) by mouth daily.  Marland Kitchen torsemide (DEMADEX) 10 MG tablet Take 5 tablets (50 mg total) by mouth daily.  . TRELEGY ELLIPTA 100-62.5-25 MCG/INH AEPB Inhale 1 puff into the lungs daily.  . vitamin C (ASCORBIC ACID) 500 MG tablet Take 500 mg by mouth daily.  Marland Kitchen zinc sulfate 220 (50 Zn) MG capsule Take 220 mg by mouth daily.   No facility-administered encounter medications on file as of 07/15/2019.     SIGNIFICANT DIAGNOSTIC EXAMS  PREVIOUS;  05-31-19: ct of abdomen and pelvis:  Large hiatal hernia stable from the prior exam. Some wall thickening in the stomach is noted suspicious for gastritis. No obstructive changes are noted. Chronic changes similar to that seen on the prior exam. Changes of anasarca stable from the previous study.  05-31-19: chest x-ray:  1. Irregularity along the right heart border likely corresponds to the large hiatal hernia seen on CT of the abdomen pelvis. 2. Bibasilar atelectasis. No other acute cardiopulmonary abnormality. Increased attenuation of the bases likely related to body habitus. 3. Slightly lobular configuration of the heart could reflect the small volume pericardial fluid seen on CT abdomen pelvis.  06-15-19: chest x-ray; No active disease. Similar appearance of large hiatal hernia, mild cardiomegaly, and basilar scarring  06-16-19: 2-echo:   1. Left ventricular ejection fraction, by visual estimation, is 55 to 60%. The left ventricle has normal function. There is no  left ventricular hypertrophy.  2. Elevated left atrial pressure.  3. Left ventricular diastolic parameters are consistent with Grade II diastolic dysfunction (pseudonormalization).  4. Global right ventricle has normal systolic function.The right ventricular size is normal. No increase in right ventricular wall thickness.  5. Left atrial size was moderately dilated.  6. Right atrial size was normal.  7.  The mitral valve was not well visualized. Trace mitral valve regurgitation. No evidence of mitral stenosis.  8. The tricuspid valve is normal in structure. Tricuspid valve regurgitation is mild.  9. The aortic valve was not well visualized. Aortic valve regurgitation is not visualized. No evidence of aortic valve sclerosis or stenosis. 10. The pulmonic valve was not well visualized. Pulmonic valve regurgitation not assessed. 11. The aortic root was not well visualized. 12. Moderately elevated pulmonary artery systolic pressure. 13. Moderate pulmonary HTN, PASP is 48 mmHg. 14. The inferior vena cava is normal in size with greater than 50% respiratory variability, suggesting right atrial pressure of 3 mmHg. 15. The interatrial septum was not well visualized.  07-05-19: chest x-ray: There is a degree of pulmonary vascular congestion with mild interstitial edema. No consolidation. There may be a degree of congestive heart failure. Sizable paraesophageal hernia noted. Port-A-Cath tip in superior vena cava.  07-05-19: ct of head: No acute intracranial finding. Atrophy and chronic small-vessel ischemic changes as seen previously.  07-06-19: renal ultrasound:  1. No hydronephrosis. 2. Thinning of bilateral renal parenchyma with increased renal echogenicity most consistent with chronic medical renal disease. 3. Post void residual of 284 cc.  NO NEW EXAMS.    LABS REVIEWED: PREVIOUS  06-15-19: wbc 10.7; hgb 11.2; hct 36.8; mcv 88.5 plt 250; glucose 181; bun 64; creat 2.05; k+ 3.9; na++ 138; ca 8.2; BNP 327.0 06-17-19: glucose 122; bun 65; creat 1.263; k+ 4.1; na++ 141; ca 8.1; mag 1.6 06-19-19: glucose 117; bun 65; creat 2.44; k+ 3.9; na++ 142; ca 8.2  06-28-19: wbc 11.9; hgb 10.0; hct 33.1; mvc 89.0 plt 239; glucose 34; bun 85; creat 3.42; k+ 3.7; na++ 140; ca 8.0  07-04-19: wbc 9.9; hgb 10.0 ;hct 33.7; mcv 91.6 plt 35; glucose 126; bun 116; creat 3.31; k+ 3.9; na++ 142; ca 8.1 liver normal  albumin 2.7 urine culture: enterobacter aerogenes 07-04-29: hgb a1c 6.8 07-07-19: wbc 11.5; hgb 8.7; hct 29.3; mcv 89.3 plt 379; glucose 112; bun 104; creat 3.12; k+ 3.5; an++ 143; ca 8.1 phos 2.9 albumin 2.4   NO NEW LABS.   Review of Systems  Constitutional: Negative for malaise/fatigue.  Respiratory: Negative for cough and shortness of breath.   Cardiovascular: Negative for chest pain, palpitations and leg swelling.  Gastrointestinal: Negative for abdominal pain, constipation and heartburn.  Musculoskeletal: Negative for back pain, joint pain and myalgias.  Skin: Negative.   Neurological: Negative for dizziness.  Psychiatric/Behavioral: Positive for depression. Negative for suicidal ideas. The patient is nervous/anxious and has insomnia.     Physical Exam Constitutional:      General: She is not in acute distress.    Appearance: She is well-developed. She is not diaphoretic.  Neck:     Thyroid: No thyromegaly.  Cardiovascular:     Rate and Rhythm: Normal rate and regular rhythm.     Pulses: Normal pulses.     Heart sounds: Normal heart sounds.  Pulmonary:     Effort: Pulmonary effort is normal. No respiratory distress.     Breath sounds: Normal breath sounds.     Comments: 02 dependent  Abdominal:  General: Bowel sounds are normal. There is no distension.     Palpations: Abdomen is soft.     Tenderness: There is no abdominal tenderness.  Musculoskeletal:        General: Normal range of motion.     Cervical back: Neck supple.     Right lower leg: No edema.     Left lower leg: No edema.  Lymphadenopathy:     Cervical: No cervical adenopathy.  Skin:    General: Skin is warm and dry.  Neurological:     Mental Status: She is alert and oriented to person, place, and time.  Psychiatric:        Mood and Affect: Mood normal.        ASSESSMENT/ PLAN:  TODAY  1. Major depression moderate single episode: is worse will begin zoloft 50 mg daily and will monitor her  status.   MD is aware of resident's narcotic use and is in agreement with current plan of care. We will attempt to wean resident as appropriate.  Ok Edwards NP [facility]  Contact [phone] Monday through Friday 8am- 5pm  After hours call [phone]

## 2019-07-16 ENCOUNTER — Non-Acute Institutional Stay (SKILLED_NURSING_FACILITY): Payer: Medicare Other | Admitting: Adult Health

## 2019-07-16 ENCOUNTER — Encounter: Payer: Self-pay | Admitting: Adult Health

## 2019-07-16 ENCOUNTER — Encounter (HOSPITAL_COMMUNITY)
Admission: RE | Admit: 2019-07-16 | Discharge: 2019-07-16 | Disposition: A | Discharge status: Home / Self Care | Payer: Medicare Other | Source: Skilled Nursing Facility | Attending: Adult Health | Admitting: Adult Health

## 2019-07-16 DIAGNOSIS — N183 Chronic kidney disease, stage 3 unspecified: Secondary | ICD-10-CM | POA: Diagnosis not present

## 2019-07-16 DIAGNOSIS — I5032 Chronic diastolic (congestive) heart failure: Secondary | ICD-10-CM | POA: Insufficient documentation

## 2019-07-16 DIAGNOSIS — E1122 Type 2 diabetes mellitus with diabetic chronic kidney disease: Secondary | ICD-10-CM

## 2019-07-16 DIAGNOSIS — M5186 Other intervertebral disc disorders, lumbar region: Secondary | ICD-10-CM | POA: Insufficient documentation

## 2019-07-16 DIAGNOSIS — I130 Hypertensive heart and chronic kidney disease with heart failure and stage 1 through stage 4 chronic kidney disease, or unspecified chronic kidney disease: Secondary | ICD-10-CM | POA: Insufficient documentation

## 2019-07-16 DIAGNOSIS — G8929 Other chronic pain: Secondary | ICD-10-CM | POA: Insufficient documentation

## 2019-07-16 DIAGNOSIS — I4892 Unspecified atrial flutter: Secondary | ICD-10-CM | POA: Diagnosis not present

## 2019-07-16 DIAGNOSIS — D500 Iron deficiency anemia secondary to blood loss (chronic): Secondary | ICD-10-CM | POA: Insufficient documentation

## 2019-07-16 DIAGNOSIS — M25522 Pain in left elbow: Secondary | ICD-10-CM | POA: Insufficient documentation

## 2019-07-16 LAB — BASIC METABOLIC PANEL
Anion gap: 15 (ref 5–15)
BUN: 96 mg/dL — ABNORMAL HIGH (ref 8–23)
CO2: 33 mmol/L — ABNORMAL HIGH (ref 22–32)
Calcium: 7.8 mg/dL — ABNORMAL LOW (ref 8.9–10.3)
Chloride: 92 mmol/L — ABNORMAL LOW (ref 98–111)
Creatinine, Ser: 3.75 mg/dL — ABNORMAL HIGH (ref 0.44–1.00)
GFR calc Af Amer: 13 mL/min — ABNORMAL LOW (ref >60)
GFR calc non Af Amer: 11 mL/min — ABNORMAL LOW (ref >60)
Glucose, Bld: 121 mg/dL — ABNORMAL HIGH (ref 70–99)
Potassium: 4.4 mmol/L (ref 3.5–5.1)
Sodium: 140 mmol/L (ref 135–145)

## 2019-07-16 NOTE — Progress Notes (Signed)
Location:    [place] Room Number: 159/P Place of Service:  SNF (31)   CODE STATUS: Full Code  No Known Allergies  Chief Complaint  Patient presents with  . Medical Management of Chronic Issues        CKD stage 3 due to type 2 diabetes mellitus:  Chronic diastolic CHF (congestive heart failure)  Atrial flutter with rapid ventricular response:  Weekly follow up for the first 30 days post hospitalization.      HPI:  She is a 73 year old short term rehab patient being seen for the management of her chronic illnesses: ckd; chf; atrial flutter. She denies any cough or shortness of breath. No uncontrolled pain. Her renal function is worse; will need to adjust her diuretics.   Past Medical History:  Diagnosis Date  . Blood transfusion without reported diagnosis   . CAD (coronary artery disease)    Nonobstructive at cardiac catheterization 2000  . Cataract   . Cervical cancer ([place]) 1978  . CHF (congestive heart failure) ([place])   . Chronic back pain   . Chronic kidney disease   . COPD (chronic obstructive pulmonary disease) ([place])   . Degenerative disc disease   . Essential hypertension, benign   . GERD (gastroesophageal reflux disease)   . Gout   . Hiatal hernia 07/27/2013  . History of diverticulitis of colon   . History of hiatal hernia   . Iron deficiency anemia   . Irritable bowel syndrome   . Lumbar radiculopathy   . Mixed hyperlipidemia   . Neuropathy   . Osteoporosis   . Ovarian cancer [facility]) 1978   patient denies. States this was cervical cancer  . Oxygen deficiency   . Sleep apnea   . Type 2 diabetes mellitus ([place])   . Vitamin B deficiency 12/25/2009  . Vitamin B12 deficiency     Past Surgical History:  Procedure Laterality Date  . ABDOMINAL HYSTERECTOMY    . BACK SURGERY    . Benign breast cysts    . CHOLECYSTECTOMY    . COLONOSCOPY  10/01/2006   SLF:Pan colonic diverticulosis and moderate internal hemorrhoids/ Otherwise no polyps,  masses, inflammatory changes or AVMs/  . COLONOSCOPY  2011   SLF: pancolonic diverticulosis, large internal hemorrhoids  . COLONOSCOPY N/A 01/26/2016   Procedure: COLONOSCOPY;  Surgeon: [name], MD;  Location: [facility];  Service: Endoscopy;  Laterality: N/A;  830   . ESOPHAGOGASTRODUODENOSCOPY  11/19/2006   SLF: Large hiatal hernia without evidence of Cameron ulcers/. Distal esophageal stricture, which allowed the gastroscope to pass without resistance.  A 16 mm Savary later passed with mild resistance/ Normal stomach.sb bx negative  . ESOPHAGOGASTRODUODENOSCOPY  10/01/2006   EXB:MWUXL hiatal hernia.  Distal esophagus without evidence of   erythema, ulceration or Barrett's esophagus  . ESOPHAGOGASTRODUODENOSCOPY  2011   SLF: large hh, distal esophageal web narrowing to 52mm s/p dilation to 98mm  . ESOPHAGOGASTRODUODENOSCOPY N/A 08/06/2013   SLF: 1. Stricture at the gastroesophageal junction 2. large hiatal hernia. 3. Mild erosive gastritis.  Marland Kitchen GIVENS CAPSULE STUDY N/A 08/06/2013   INCOMPLETE-SMALL BOWLE ULCERS  . KNEE SURGERY Right   . PARTIAL HYSTERECTOMY  1978  . small bowel capsule  2008   negative  . SPINE SURGERY    . TONSILLECTOMY AND ADENOIDECTOMY    . Two back surgeries/fusion    . UMBILICAL HERNIA REPAIR  2010    Social History   Socioeconomic History  . Marital  status: Widowed    Spouse name: Poss  . Number of children: 4  . Years of education: Not on file  . Highest education level: 10th grade  Occupational History  . Occupation: retired    Comment: Ambulance person, [employer] work  Tobacco Use  . Smoking status: Former Smoker    Packs/day: 1.00    Years: 1.00    Pack years: 1.00    Types: Cigarettes    Start date: 02/19/1961    Quit date: 08/05/1961    Years since quitting: 57.9  . Smokeless tobacco: Never Used  . Tobacco comment: Quit smoking x 50 years  Substance and Sexual Activity  . Alcohol use: No  . Drug use: No  . Sexual activity: Not Currently    Other Topics Concern  . Not on file  Social History Narrative   HAS 4 SON-GRAND [place].   Lives in home with [place] - married 13 Y   Cook, sew, quilt, crochet   Social Determinants of Health   Financial Resource Strain:   . Difficulty of Paying Living Expenses: Not on file  Food Insecurity:   . Worried About Charity fundraiser in the Last Year: Not on file  . Ran Out of Food in the Last Year: Not on file  Transportation Needs:   . Lack of Transportation (Medical): Not on file  . Lack of Transportation (Non-Medical): Not on file  Physical Activity:   . Days of Exercise per Week: Not on file  . Minutes of Exercise per Session: Not on file  Stress:   . Feeling of Stress : Not on file  Social Connections:   . Frequency of Communication with Friends and Family: Not on file  . Frequency of Social Gatherings with Friends and Family: Not on file  . Attends Religious Services: Not on file  . Active Member of Clubs or Organizations: Not on file  . Attends Archivist Meetings: Not on file  . Marital Status: Not on file  Intimate Partner Violence:   . Fear of Current or Ex-Partner: Not on file  . Emotionally Abused: Not on file  . Physically Abused: Not on file  . Sexually Abused: Not on file   Family History  Problem Relation Age of Onset  . Colon cancer Brother        diagnosed age 42. Living.   Marland Kitchen Ulcers Sister   . Diabetes Sister   . Heart attack Sister   . Kidney failure Sister   . Stroke Sister   . Ulcers Mother   . Diabetes Mother   . Heart attack Mother   . Stroke Mother   . Asthma Mother   . Heart disease Mother   . Cervical cancer Mother   . Heart attack Brother   . Heart disease Brother   . Asthma Sister   . Diabetes Brother   . Stroke Maternal Grandmother   . Heart attack Maternal Grandmother   . Heart attack Other   . Early death Father        MVA in his 66s      VITAL SIGNS BP 114/84   Pulse 92   Temp (!) 97 F (36.1  C) (Oral)   Resp 20   Ht 4\' 8"  (1.422 m)   Wt 189 lb 4.8 oz (85.9 kg)   SpO2 95%   BMI 42.44 kg/m   Outpatient Encounter Medications as of 07/16/2019  Medication Sig  . albuterol (PROVENTIL) (  2.5 MG/3ML) 0.083% nebulizer solution Take 2.5 mg by nebulization every 6 (six) hours as needed for wheezing or shortness of breath.   Marland Kitchen albuterol (VENTOLIN HFA) 108 (90 Base) MCG/ACT inhaler Inhale 2 puffs into the lungs every 6 (six) hours as needed for wheezing or shortness of breath.   . allopurinol (ZYLOPRIM) 100 MG tablet Take 100 mg by mouth 2 (two) times daily.   Marland Kitchen apixaban (ELIQUIS) 5 MG TABS tablet Take 1 tablet (5 mg total) by mouth 2 (two) times daily.  Marland Kitchen aspirin EC 81 MG tablet Take 81 mg by mouth daily.  Marland Kitchen atorvastatin (LIPITOR) 40 MG tablet Take 1 tablet (40 mg total) by mouth at bedtime.  . Biotin 5 MG CAPS Take 5 mg by mouth daily.  . Cholecalciferol 1.25 MG (50000 UT) capsule Take 50,000 Units by mouth. Once a day on Thursday  . diltiazem (CARDIZEM CD) 120 MG 24 hr capsule Take 1 capsule (120 mg total) by mouth daily.  Marland Kitchen esomeprazole (NEXIUM) 40 MG capsule TAKE 1 CAPSULE TWICE A DAY 30 MINUTES PRIOR TO MEALS  . famotidine (PEPCID) 40 MG tablet TAKE 1 TABLET AT BEDTIME AS NEEDED TO CONTROL REFLUX  . feeding supplement, GLUCERNA SHAKE, (GLUCERNA SHAKE) LIQD Take 237 mLs by mouth 2 (two) times daily between meals.  . hydrALAZINE (APRESOLINE) 25 MG tablet Take 25 mg by mouth 2 (two) times daily.  Marland Kitchen LANTUS SOLOSTAR 100 UNIT/ML SOPN Inject 8 Units into the skin at bedtime.   Marland Kitchen loperamide (IMODIUM) 2 MG capsule Take 2 mg by mouth every 6 (six) hours as needed for diarrhea or loose stools.   . metoprolol succinate (TOPROL-XL) 25 MG 24 hr tablet Take 25 mg by mouth daily. Along with 50 mg to = 75 mg  . metoprolol succinate (TOPROL-XL) 50 MG 24 hr tablet Take 50 mg by mouth daily. Along with 25 mg to = 75 mg  . NON FORMULARY Diet: Regular, NAS Consistent Carbohydrate  . NONFORMULARY OR  COMPOUNDED ITEM May use CPaP from home at previous home settings while asleep. Every Shift Day, Evening, Night  . OXYGEN Inhale 2 L into the lungs. Every Shift  . potassium chloride SA (KLOR-CON) 10 MEQ tablet Take 1 tablet (10 mEq total) by mouth every other day.  . sertraline (ZOLOFT) 50 MG tablet Take 50 mg by mouth daily.  . tamsulosin (FLOMAX) 0.4 MG CAPS capsule Take 1 capsule (0.4 mg total) by mouth daily.  Marland Kitchen torsemide (DEMADEX) 10 MG tablet Take 5 tablets (50 mg total) by mouth daily.  . TRELEGY ELLIPTA 100-62.5-25 MCG/INH AEPB Inhale 1 puff into the lungs daily.  . vitamin C (ASCORBIC ACID) 500 MG tablet Take 500 mg by mouth daily.  Marland Kitchen zinc sulfate 220 (50 Zn) MG capsule Take 220 mg by mouth daily.   No facility-administered encounter medications on file as of 07/16/2019.     SIGNIFICANT DIAGNOSTIC EXAMS   PREVIOUS;  05-31-19: ct of abdomen and pelvis:  Large hiatal hernia stable from the prior exam. Some wall thickening in the stomach is noted suspicious for gastritis. No obstructive changes are noted. Chronic changes similar to that seen on the prior exam. Changes of anasarca stable from the previous study.  05-31-19: chest x-ray:  1. Irregularity along the right heart border likely corresponds to the large hiatal hernia seen on CT of the abdomen pelvis. 2. Bibasilar atelectasis. No other acute cardiopulmonary abnormality. Increased attenuation of the bases likely related to body habitus. 3. Slightly lobular configuration of  the heart could reflect the small volume pericardial fluid seen on CT abdomen pelvis.  06-15-19: chest x-ray; No active disease. Similar appearance of large hiatal hernia, mild cardiomegaly, and basilar scarring  06-16-19: 2-echo:   1. Left ventricular ejection fraction, by visual estimation, is 55 to 60%. The left ventricle has normal function. There is no left ventricular hypertrophy.  2. Elevated left atrial pressure.  3. Left ventricular  diastolic parameters are consistent with Grade II diastolic dysfunction (pseudonormalization).  4. Global right ventricle has normal systolic function.The right ventricular size is normal. No increase in right ventricular wall thickness.  5. Left atrial size was moderately dilated.  6. Right atrial size was normal.  7. The mitral valve was not well visualized. Trace mitral valve regurgitation. No evidence of mitral stenosis.  8. The tricuspid valve is normal in structure. Tricuspid valve regurgitation is mild.  9. The aortic valve was not well visualized. Aortic valve regurgitation is not visualized. No evidence of aortic valve sclerosis or stenosis. 10. The pulmonic valve was not well visualized. Pulmonic valve regurgitation not assessed. 11. The aortic root was not well visualized. 12. Moderately elevated pulmonary artery systolic pressure. 13. Moderate pulmonary HTN, PASP is 48 mmHg. 14. The inferior vena cava is normal in size with greater than 50% respiratory variability, suggesting right atrial pressure of 3 mmHg. 15. The interatrial septum was not well visualized.  07-05-19: chest x-ray: There is a degree of pulmonary vascular congestion with mild interstitial edema. No consolidation. There may be a degree of congestive heart failure. Sizable paraesophageal hernia noted. Port-A-Cath tip in superior vena cava.  07-05-19: ct of head: No acute intracranial finding. Atrophy and chronic small-vessel ischemic changes as seen previously.  07-06-19: renal ultrasound:  1. No hydronephrosis. 2. Thinning of bilateral renal parenchyma with increased renal echogenicity most consistent with chronic medical renal disease. 3. Post void residual of 284 cc.  NO NEW EXAMS.    LABS REVIEWED: PREVIOUS  06-15-19: wbc 10.7; hgb 11.2; hct 36.8; mcv 88.5 plt 250; glucose 181; bun 64; creat 2.05; k+ 3.9; na++ 138; ca 8.2; BNP 327.0 06-17-19: glucose 122; bun 65; creat 1.263; k+ 4.1; na++ 141; ca 8.1; mag  1.6 06-19-19: glucose 117; bun 65; creat 2.44; k+ 3.9; na++ 142; ca 8.2  06-28-19: wbc 11.9; hgb 10.0; hct 33.1; mvc 89.0 plt 239; glucose 34; bun 85; creat 3.42; k+ 3.7; na++ 140; ca 8.0  07-04-19: wbc 9.9; hgb 10.0 ;hct 33.7; mcv 91.6 plt 35; glucose 126; bun 116; creat 3.31; k+ 3.9; na++ 142; ca 8.1 liver normal albumin 2.7 urine culture: enterobacter aerogenes 07-04-29: hgb a1c 6.8 07-07-19: wbc 11.5; hgb 8.7; hct 29.3; mcv 89.3 plt 379; glucose 112; bun 104; creat 3.12; k+ 3.5; an++ 143; ca 8.1 phos 2.9 albumin 2.4   TODAY;   07-16-19: glucose 121; bun 96; creat 3.75; k+ 4.4; na++ 140; ca 7.8   Review of Systems  Constitutional: Negative for malaise/fatigue.  Respiratory: Negative for cough and shortness of breath.   Cardiovascular: Negative for chest pain, palpitations and leg swelling.  Gastrointestinal: Negative for abdominal pain, constipation and heartburn.  Musculoskeletal: Negative for back pain, joint pain and myalgias.  Skin: Negative.   Neurological: Negative for dizziness.  Psychiatric/Behavioral: Positive for depression. The patient is not nervous/anxious.     Physical Exam Constitutional:      General: She is not in acute distress.    Appearance: She is well-developed. She is not diaphoretic.  Neck:  Thyroid: No thyromegaly.  Cardiovascular:     Rate and Rhythm: Normal rate and regular rhythm.     Pulses: Normal pulses.     Heart sounds: Normal heart sounds.  Pulmonary:     Effort: Pulmonary effort is normal. No respiratory distress.     Breath sounds: Normal breath sounds.     Comments: 02 dependent  Abdominal:     General: Bowel sounds are normal. There is no distension.     Palpations: Abdomen is soft.     Tenderness: There is no abdominal tenderness.  Musculoskeletal:        General: Normal range of motion.     Cervical back: Neck supple.  Lymphadenopathy:     Cervical: No cervical adenopathy.  Skin:    General: Skin is warm and dry.    Neurological:     Mental Status: She is alert and oriented to person, place, and time.  Psychiatric:        Mood and Affect: Mood normal.         ASSESSMENT/ PLAN:  TODAY;   1. CKD stage 3 due to type 2 diabetes mellitus: is worse: bun 96; creat 3.75 will hold demadex for 2 days then start 40 mg daily and will check bmp on 07-20-19   2. Chronic diastolic CHF (congestive heart failure) is stable EF 55-60% (06-16-19) hold demadex for 2 days then start 40 mg daily will continue toprol cl 75 mg daily and apresoline 25 mg twice daily   3. Atrial flutter with rapid ventricular response: heart rate stable will continue cardizem cd 120 mg daily and toprol xl 75 mg daily for rate control and eliquis 5 mg twice daily   PREVIOUS   4. 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 OSA on CPAP is stable uses cpap nightly   6. obstructive chronic bronchitis without exacerbation /chronic respiratory failure with hypoxia: stable is 02 dependent will continue trelegy 100/625/25 mcg 1 puff daily has albuterol 2 puffs/neb treatment every 6 hours as needed  7. Hiatal hernia with GERD without esophagitis: is stable will continue nexium 40 mg twice daily   8. DM type 2 with peripheral neuropathy is stable hgb a1c 6.8 lantus 8 units daily asa 81 mg daily is on statin  9. 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   10.  Iron deficiency anemia due to chronic blood loss: is stable hgb 8.7 will monitor   11. Dyslipidemia associated with type 2 diabetes mellitus: is stable will continue lipitor 40 gm daily   12. Chronic gout 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 unspecified site is stable no recent flares will continue allopurinol 100 mg twice daily   13. Urinary retention: is stable will  continue flomax 0.4 mg daily   14. Protein calorie malnutrition: severe: is stable albumin 2.4 weight is 197 pounds will continue supplements as directed    MD is aware of resident's narcotic use and is in agreement with current plan of care. We will attempt to wean resident as appropriate.  Ok Edwards NP [facility]  Contact [phone] Monday through Friday 8am- 5pm  After hours call [phone]

## 2019-07-17 ENCOUNTER — Telehealth: Payer: Self-pay | Admitting: Nurse Practitioner

## 2019-07-17 ENCOUNTER — Other Ambulatory Visit: Payer: Self-pay | Admitting: Internal Medicine

## 2019-07-17 ENCOUNTER — Emergency Department (HOSPITAL_COMMUNITY): Payer: Medicare Other

## 2019-07-17 ENCOUNTER — Inpatient Hospital Stay (HOSPITAL_COMMUNITY)
Admission: EM | Admit: 2019-07-17 | Discharge: 2019-08-11 | DRG: 368 | Disposition: A | Discharge status: Skilled Nursing Facility | Payer: Medicare Other | Source: Skilled Nursing Facility | Attending: Internal Medicine | Admitting: Internal Medicine

## 2019-07-17 ENCOUNTER — Other Ambulatory Visit: Payer: Self-pay

## 2019-07-17 ENCOUNTER — Encounter (HOSPITAL_COMMUNITY): Payer: Self-pay

## 2019-07-17 DIAGNOSIS — Z791 Long term (current) use of non-steroidal anti-inflammatories (NSAID): Secondary | ICD-10-CM

## 2019-07-17 DIAGNOSIS — N320 Bladder-neck obstruction: Secondary | ICD-10-CM | POA: Diagnosis not present

## 2019-07-17 DIAGNOSIS — Z823 Family history of stroke: Secondary | ICD-10-CM

## 2019-07-17 DIAGNOSIS — N170 Acute kidney failure with tubular necrosis: Secondary | ICD-10-CM | POA: Diagnosis not present

## 2019-07-17 DIAGNOSIS — Z992 Dependence on renal dialysis: Secondary | ICD-10-CM

## 2019-07-17 DIAGNOSIS — I5032 Chronic diastolic (congestive) heart failure: Secondary | ICD-10-CM | POA: Diagnosis present

## 2019-07-17 DIAGNOSIS — D62 Acute posthemorrhagic anemia: Secondary | ICD-10-CM | POA: Diagnosis present

## 2019-07-17 DIAGNOSIS — M810 Age-related osteoporosis without current pathological fracture: Secondary | ICD-10-CM | POA: Diagnosis present

## 2019-07-17 DIAGNOSIS — E785 Hyperlipidemia, unspecified: Secondary | ICD-10-CM | POA: Diagnosis not present

## 2019-07-17 DIAGNOSIS — K449 Diaphragmatic hernia without obstruction or gangrene: Secondary | ICD-10-CM | POA: Diagnosis present

## 2019-07-17 DIAGNOSIS — J449 Chronic obstructive pulmonary disease, unspecified: Secondary | ICD-10-CM | POA: Diagnosis present

## 2019-07-17 DIAGNOSIS — Z800 Family history of malignant neoplasm of digestive organs: Secondary | ICD-10-CM

## 2019-07-17 DIAGNOSIS — E1121 Type 2 diabetes mellitus with diabetic nephropathy: Secondary | ICD-10-CM | POA: Diagnosis present

## 2019-07-17 DIAGNOSIS — Z79899 Other long term (current) drug therapy: Secondary | ICD-10-CM

## 2019-07-17 DIAGNOSIS — J9611 Chronic respiratory failure with hypoxia: Secondary | ICD-10-CM | POA: Diagnosis present

## 2019-07-17 DIAGNOSIS — E66813 Obesity, class 3: Secondary | ICD-10-CM | POA: Diagnosis present

## 2019-07-17 DIAGNOSIS — K922 Gastrointestinal hemorrhage, unspecified: Secondary | ICD-10-CM | POA: Diagnosis present

## 2019-07-17 DIAGNOSIS — Z7189 Other specified counseling: Secondary | ICD-10-CM

## 2019-07-17 DIAGNOSIS — R1314 Dysphagia, pharyngoesophageal phase: Secondary | ICD-10-CM | POA: Diagnosis present

## 2019-07-17 DIAGNOSIS — D631 Anemia in chronic kidney disease: Secondary | ICD-10-CM | POA: Diagnosis present

## 2019-07-17 DIAGNOSIS — F329 Major depressive disorder, single episode, unspecified: Secondary | ICD-10-CM | POA: Diagnosis present

## 2019-07-17 DIAGNOSIS — B9689 Other specified bacterial agents as the cause of diseases classified elsewhere: Secondary | ICD-10-CM | POA: Diagnosis present

## 2019-07-17 DIAGNOSIS — L8990 Pressure ulcer of unspecified site, unspecified stage: Secondary | ICD-10-CM | POA: Insufficient documentation

## 2019-07-17 DIAGNOSIS — Z8249 Family history of ischemic heart disease and other diseases of the circulatory system: Secondary | ICD-10-CM

## 2019-07-17 DIAGNOSIS — Z20822 Contact with and (suspected) exposure to covid-19: Secondary | ICD-10-CM | POA: Diagnosis present

## 2019-07-17 DIAGNOSIS — M109 Gout, unspecified: Secondary | ICD-10-CM | POA: Diagnosis present

## 2019-07-17 DIAGNOSIS — E782 Mixed hyperlipidemia: Secondary | ICD-10-CM | POA: Diagnosis present

## 2019-07-17 DIAGNOSIS — K2100 Gastro-esophageal reflux disease with esophagitis, without bleeding: Secondary | ICD-10-CM

## 2019-07-17 DIAGNOSIS — Z7951 Long term (current) use of inhaled steroids: Secondary | ICD-10-CM

## 2019-07-17 DIAGNOSIS — I4891 Unspecified atrial fibrillation: Secondary | ICD-10-CM | POA: Diagnosis present

## 2019-07-17 DIAGNOSIS — N390 Urinary tract infection, site not specified: Secondary | ICD-10-CM | POA: Diagnosis present

## 2019-07-17 DIAGNOSIS — F419 Anxiety disorder, unspecified: Secondary | ICD-10-CM | POA: Diagnosis present

## 2019-07-17 DIAGNOSIS — N179 Acute kidney failure, unspecified: Secondary | ICD-10-CM

## 2019-07-17 DIAGNOSIS — Z9049 Acquired absence of other specified parts of digestive tract: Secondary | ICD-10-CM

## 2019-07-17 DIAGNOSIS — Z825 Family history of asthma and other chronic lower respiratory diseases: Secondary | ICD-10-CM

## 2019-07-17 DIAGNOSIS — E1122 Type 2 diabetes mellitus with diabetic chronic kidney disease: Secondary | ICD-10-CM | POA: Diagnosis present

## 2019-07-17 DIAGNOSIS — I132 Hypertensive heart and chronic kidney disease with heart failure and with stage 5 chronic kidney disease, or end stage renal disease: Secondary | ICD-10-CM | POA: Diagnosis present

## 2019-07-17 DIAGNOSIS — F321 Major depressive disorder, single episode, moderate: Secondary | ICD-10-CM | POA: Diagnosis present

## 2019-07-17 DIAGNOSIS — Z833 Family history of diabetes mellitus: Secondary | ICD-10-CM

## 2019-07-17 DIAGNOSIS — I4819 Other persistent atrial fibrillation: Secondary | ICD-10-CM | POA: Diagnosis present

## 2019-07-17 DIAGNOSIS — K2211 Ulcer of esophagus with bleeding: Secondary | ICD-10-CM | POA: Diagnosis present

## 2019-07-17 DIAGNOSIS — Z9189 Other specified personal risk factors, not elsewhere classified: Secondary | ICD-10-CM

## 2019-07-17 DIAGNOSIS — I484 Atypical atrial flutter: Secondary | ICD-10-CM | POA: Diagnosis present

## 2019-07-17 DIAGNOSIS — K2101 Gastro-esophageal reflux disease with esophagitis, with bleeding: Secondary | ICD-10-CM | POA: Diagnosis not present

## 2019-07-17 DIAGNOSIS — K920 Hematemesis: Secondary | ICD-10-CM

## 2019-07-17 DIAGNOSIS — Z87891 Personal history of nicotine dependence: Secondary | ICD-10-CM

## 2019-07-17 DIAGNOSIS — N186 End stage renal disease: Secondary | ICD-10-CM | POA: Diagnosis present

## 2019-07-17 DIAGNOSIS — I130 Hypertensive heart and chronic kidney disease with heart failure and stage 1 through stage 4 chronic kidney disease, or unspecified chronic kidney disease: Secondary | ICD-10-CM | POA: Diagnosis present

## 2019-07-17 DIAGNOSIS — Z7901 Long term (current) use of anticoagulants: Secondary | ICD-10-CM

## 2019-07-17 DIAGNOSIS — E440 Moderate protein-calorie malnutrition: Secondary | ICD-10-CM | POA: Diagnosis not present

## 2019-07-17 DIAGNOSIS — L89892 Pressure ulcer of other site, stage 2: Secondary | ICD-10-CM | POA: Diagnosis present

## 2019-07-17 DIAGNOSIS — G4733 Obstructive sleep apnea (adult) (pediatric): Secondary | ICD-10-CM

## 2019-07-17 DIAGNOSIS — I2510 Atherosclerotic heart disease of native coronary artery without angina pectoris: Secondary | ICD-10-CM | POA: Diagnosis present

## 2019-07-17 DIAGNOSIS — R32 Unspecified urinary incontinence: Secondary | ICD-10-CM | POA: Diagnosis present

## 2019-07-17 DIAGNOSIS — Z452 Encounter for adjustment and management of vascular access device: Secondary | ICD-10-CM

## 2019-07-17 DIAGNOSIS — Z8541 Personal history of malignant neoplasm of cervix uteri: Secondary | ICD-10-CM

## 2019-07-17 DIAGNOSIS — Z90711 Acquired absence of uterus with remaining cervical stump: Secondary | ICD-10-CM

## 2019-07-17 DIAGNOSIS — K222 Esophageal obstruction: Secondary | ICD-10-CM | POA: Diagnosis present

## 2019-07-17 DIAGNOSIS — N19 Unspecified kidney failure: Secondary | ICD-10-CM

## 2019-07-17 DIAGNOSIS — Z7982 Long term (current) use of aspirin: Secondary | ICD-10-CM

## 2019-07-17 DIAGNOSIS — Z794 Long term (current) use of insulin: Secondary | ICD-10-CM

## 2019-07-17 DIAGNOSIS — Z6841 Body Mass Index (BMI) 40.0 and over, adult: Secondary | ICD-10-CM

## 2019-07-17 DIAGNOSIS — Z8049 Family history of malignant neoplasm of other genital organs: Secondary | ICD-10-CM

## 2019-07-17 DIAGNOSIS — N184 Chronic kidney disease, stage 4 (severe): Secondary | ICD-10-CM

## 2019-07-17 DIAGNOSIS — Z95828 Presence of other vascular implants and grafts: Secondary | ICD-10-CM

## 2019-07-17 DIAGNOSIS — R579 Shock, unspecified: Secondary | ICD-10-CM | POA: Diagnosis not present

## 2019-07-17 DIAGNOSIS — Z841 Family history of disorders of kidney and ureter: Secondary | ICD-10-CM

## 2019-07-17 DIAGNOSIS — N1832 Chronic kidney disease, stage 3b: Secondary | ICD-10-CM

## 2019-07-17 DIAGNOSIS — Z9989 Dependence on other enabling machines and devices: Secondary | ICD-10-CM

## 2019-07-17 DIAGNOSIS — Z9981 Dependence on supplemental oxygen: Secondary | ICD-10-CM

## 2019-07-17 DIAGNOSIS — R34 Anuria and oliguria: Secondary | ICD-10-CM | POA: Diagnosis not present

## 2019-07-17 LAB — COMPREHENSIVE METABOLIC PANEL
ALT: 10 U/L (ref 0–44)
AST: 12 U/L — ABNORMAL LOW (ref 15–41)
Albumin: 2.3 g/dL — ABNORMAL LOW (ref 3.5–5.0)
Alkaline Phosphatase: 61 U/L (ref 38–126)
Anion gap: 16 — ABNORMAL HIGH (ref 5–15)
BUN: 97 mg/dL — ABNORMAL HIGH (ref 8–23)
CO2: 31 mmol/L (ref 22–32)
Calcium: 7.7 mg/dL — ABNORMAL LOW (ref 8.9–10.3)
Chloride: 92 mmol/L — ABNORMAL LOW (ref 98–111)
Creatinine, Ser: 3.76 mg/dL — ABNORMAL HIGH (ref 0.44–1.00)
GFR calc Af Amer: 13 mL/min — ABNORMAL LOW (ref >60)
GFR calc non Af Amer: 11 mL/min — ABNORMAL LOW (ref >60)
Glucose, Bld: 142 mg/dL — ABNORMAL HIGH (ref 70–99)
Potassium: 4.4 mmol/L (ref 3.5–5.1)
Sodium: 139 mmol/L (ref 135–145)
Total Bilirubin: 1.2 mg/dL (ref 0.3–1.2)
Total Protein: 6.7 g/dL (ref 6.5–8.1)

## 2019-07-17 LAB — URINALYSIS, ROUTINE W REFLEX MICROSCOPIC
Bilirubin Urine: NEGATIVE
Glucose, UA: NEGATIVE mg/dL
Ketones, ur: 5 mg/dL — AB
Nitrite: NEGATIVE
Protein, ur: 100 mg/dL — AB
Specific Gravity, Urine: 1.011 (ref 1.005–1.030)
WBC, UA: >50 WBC/hpf — ABNORMAL HIGH (ref 0–5)
pH: 7 (ref 5.0–8.0)

## 2019-07-17 LAB — CBC WITH DIFFERENTIAL/PLATELET
Abs Immature Granulocytes: 0.05 10*3/uL (ref 0.00–0.07)
Basophils Absolute: 0 10*3/uL (ref 0.0–0.1)
Basophils Relative: 0 %
Eosinophils Absolute: 0.1 10*3/uL (ref 0.0–0.5)
Eosinophils Relative: 1 %
HCT: 24.8 % — ABNORMAL LOW (ref 36.0–46.0)
Hemoglobin: 7.8 g/dL — ABNORMAL LOW (ref 12.0–15.0)
Immature Granulocytes: 1 %
Lymphocytes Relative: 6 %
Lymphs Abs: 0.7 10*3/uL (ref 0.7–4.0)
MCH: 27.9 pg (ref 26.0–34.0)
MCHC: 31.5 g/dL (ref 30.0–36.0)
MCV: 88.6 fL (ref 80.0–100.0)
Monocytes Absolute: 0.6 10*3/uL (ref 0.1–1.0)
Monocytes Relative: 6 %
Neutro Abs: 9.6 10*3/uL — ABNORMAL HIGH (ref 1.7–7.7)
Neutrophils Relative %: 86 %
Platelets: 317 10*3/uL (ref 150–400)
RBC: 2.8 MIL/uL — ABNORMAL LOW (ref 3.87–5.11)
RDW: 16.9 % — ABNORMAL HIGH (ref 11.5–15.5)
WBC: 11.1 10*3/uL — ABNORMAL HIGH (ref 4.0–10.5)
nRBC: 0 % (ref 0.0–0.2)

## 2019-07-17 LAB — LACTIC ACID, PLASMA
Lactic Acid, Venous: 0.7 mmol/L (ref 0.5–1.9)
Lactic Acid, Venous: 0.7 mmol/L (ref 0.5–1.9)

## 2019-07-17 LAB — GLUCOSE, CAPILLARY: Glucose-Capillary: 118 mg/dL — ABNORMAL HIGH (ref 70–99)

## 2019-07-17 LAB — TROPONIN I (HIGH SENSITIVITY)
Troponin I (High Sensitivity): 19 ng/L — ABNORMAL HIGH (ref <18)
Troponin I (High Sensitivity): 20 ng/L — ABNORMAL HIGH (ref <18)

## 2019-07-17 LAB — LIPASE, BLOOD: Lipase: 33 U/L (ref 11–51)

## 2019-07-17 MED ORDER — FLUTICASONE-UMECLIDIN-VILANT 100-62.5-25 MCG/INH IN AEPB: 1.0000 | INHALATION_SPRAY | Freq: Every day | RESPIRATORY_TRACT | Status: DC

## 2019-07-17 MED ORDER — FLUTICASONE FUROATE-VILANTEROL 100-25 MCG/INH IN AEPB
1.0000 | INHALATION_SPRAY | Freq: Every day | RESPIRATORY_TRACT | Status: DC
  Administered 2019-07-18 – 2019-08-11 (×16): 1 via RESPIRATORY_TRACT
  Filled 2019-07-17 (×3): qty 28

## 2019-07-17 MED ORDER — ACETAMINOPHEN 325 MG PO TABS
650.0000 mg | ORAL_TABLET | Freq: Four times a day (QID) | ORAL | Status: DC | PRN
  Administered 2019-07-17 – 2019-07-29 (×2): 650 mg via ORAL
  Filled 2019-07-17 (×2): qty 2

## 2019-07-17 MED ORDER — ALLOPURINOL 100 MG PO TABS
100.0000 mg | ORAL_TABLET | Freq: Two times a day (BID) | ORAL | Status: DC
  Administered 2019-07-17 – 2019-07-24 (×14): 100 mg via ORAL
  Filled 2019-07-17 (×14): qty 1

## 2019-07-17 MED ORDER — SODIUM CHLORIDE 0.9% FLUSH
3.0000 mL | Freq: Two times a day (BID) | INTRAVENOUS | Status: DC
  Administered 2019-07-17 – 2019-07-23 (×11): 3 mL via INTRAVENOUS
  Administered 2019-07-23: 10 mL via INTRAVENOUS
  Administered 2019-07-24 – 2019-07-28 (×8): 3 mL via INTRAVENOUS

## 2019-07-17 MED ORDER — SODIUM CHLORIDE 0.9 % IV SOLN
250.0000 mL | INTRAVENOUS | Status: DC | PRN
  Administered 2019-07-25 – 2019-07-28 (×3): 250 mL via INTRAVENOUS

## 2019-07-17 MED ORDER — ATORVASTATIN CALCIUM 40 MG PO TABS
40.0000 mg | ORAL_TABLET | Freq: Every day | ORAL | Status: DC
  Administered 2019-07-17 – 2019-08-10 (×25): 40 mg via ORAL
  Filled 2019-07-17 (×25): qty 1

## 2019-07-17 MED ORDER — UMECLIDINIUM BROMIDE 62.5 MCG/INH IN AEPB
1.0000 | INHALATION_SPRAY | Freq: Every day | RESPIRATORY_TRACT | Status: DC
  Administered 2019-07-18 – 2019-08-11 (×18): 1 via RESPIRATORY_TRACT
  Filled 2019-07-17 (×3): qty 7

## 2019-07-17 MED ORDER — METOPROLOL SUCCINATE ER 25 MG PO TB24: 50.0000 mg | ORAL_TABLET | Freq: Every day | ORAL | Status: DC

## 2019-07-17 MED ORDER — DILTIAZEM HCL ER COATED BEADS 120 MG PO CP24
120.0000 mg | ORAL_CAPSULE | Freq: Every day | ORAL | Status: DC
  Administered 2019-07-18: 10:00:00 120 mg via ORAL
  Filled 2019-07-17: qty 1

## 2019-07-17 MED ORDER — SODIUM CHLORIDE 0.9 % IV BOLUS
500.0000 mL | Freq: Once | INTRAVENOUS | Status: AC
  Administered 2019-07-17: 500 mL via INTRAVENOUS

## 2019-07-17 MED ORDER — SODIUM CHLORIDE 0.9% FLUSH: 3.0000 mL | INTRAVENOUS | Status: DC | PRN

## 2019-07-17 MED ORDER — METOPROLOL SUCCINATE ER 50 MG PO TB24
75.0000 mg | ORAL_TABLET | Freq: Every day | ORAL | Status: DC
  Administered 2019-07-18 – 2019-07-20 (×2): 75 mg via ORAL
  Filled 2019-07-17 (×2): qty 1

## 2019-07-17 MED ORDER — INSULIN ASPART 100 UNIT/ML ~~LOC~~ SOLN
0.0000 [IU] | Freq: Three times a day (TID) | SUBCUTANEOUS | Status: DC
  Administered 2019-07-20 – 2019-08-02 (×10): 1 [IU] via SUBCUTANEOUS
  Administered 2019-08-06: 2 [IU] via SUBCUTANEOUS
  Administered 2019-08-08 – 2019-08-11 (×3): 1 [IU] via SUBCUTANEOUS

## 2019-07-17 MED ORDER — TAMSULOSIN HCL 0.4 MG PO CAPS
0.4000 mg | ORAL_CAPSULE | Freq: Every day | ORAL | Status: DC
  Administered 2019-07-18 – 2019-07-22 (×4): 0.4 mg via ORAL
  Filled 2019-07-17 (×4): qty 1

## 2019-07-17 MED ORDER — HYDRALAZINE HCL 25 MG PO TABS
25.0000 mg | ORAL_TABLET | Freq: Two times a day (BID) | ORAL | Status: DC
  Administered 2019-07-17 – 2019-07-20 (×5): 25 mg via ORAL
  Filled 2019-07-17 (×4): qty 1

## 2019-07-17 MED ORDER — ALBUTEROL SULFATE (2.5 MG/3ML) 0.083% IN NEBU: 2.5000 mg | INHALATION_SOLUTION | Freq: Four times a day (QID) | RESPIRATORY_TRACT | Status: DC | PRN

## 2019-07-17 MED ORDER — ACETAMINOPHEN 650 MG RE SUPP: 650.0000 mg | Freq: Four times a day (QID) | RECTAL | Status: DC | PRN

## 2019-07-17 MED ORDER — SERTRALINE HCL 50 MG PO TABS
50.0000 mg | ORAL_TABLET | Freq: Every day | ORAL | Status: DC
  Administered 2019-07-18 – 2019-08-11 (×22): 50 mg via ORAL
  Filled 2019-07-17 (×23): qty 1

## 2019-07-17 MED ORDER — PANTOPRAZOLE SODIUM 40 MG IV SOLR
40.0000 mg | Freq: Two times a day (BID) | INTRAVENOUS | Status: DC
  Administered 2019-07-17 – 2019-07-24 (×14): 40 mg via INTRAVENOUS
  Filled 2019-07-17 (×14): qty 40

## 2019-07-17 MED ORDER — FAMOTIDINE 20 MG PO TABS
20.0000 mg | ORAL_TABLET | Freq: Every day | ORAL | Status: DC
  Administered 2019-07-17 – 2019-07-25 (×9): 20 mg via ORAL
  Filled 2019-07-17 (×9): qty 1

## 2019-07-17 MED ORDER — INSULIN GLARGINE 100 UNIT/ML ~~LOC~~ SOLN
5.0000 [IU] | Freq: Every day | SUBCUTANEOUS | Status: DC
  Administered 2019-07-17 – 2019-08-10 (×24): 5 [IU] via SUBCUTANEOUS
  Filled 2019-07-17 (×27): qty 0.05

## 2019-07-17 MED ORDER — PANTOPRAZOLE SODIUM 40 MG IV SOLR: 40.0000 mg | Freq: Once | INTRAVENOUS | Status: DC

## 2019-07-17 NOTE — Progress Notes (Signed)
Patient wanted to wait and use her home CPAP. Patient on 2 liters at home

## 2019-07-17 NOTE — Telephone Encounter (Signed)
Called and reported coffee ground emesis. Recommended ED evaluation after the second episode. Initially VS 113/51, 98.0 85, 24, 95% on O2 2lpm, hold ASA/Eliquis, prn Zofran, stat CBC, BMP, on Nexium Pepcid already.

## 2019-07-17 NOTE — ED Notes (Signed)
Pt given sips of water

## 2019-07-17 NOTE — H&P (Signed)
History and Physical    [name] DXI:[id number] DOB: 1946-05-18 DOA: 07/17/2019  Referring MD/NP/PA: Dr. Wyvonnia Dusky PCP: Rita Mealy, MD  Patient coming from: SNF [facility])  Chief Complaint: Nausea/vomiting and coffee-ground emesis.  Abdominal pain  HPI: [name] is a 73 y.o. female with past medical history significant for diastolic heart failure, chronic kidney disease a stage IIIb; COPD, chronic respiratory failure with hypoxia, obesity, obstructive sleep apnea, atrial fibrillation (chronically on anticoagulation), gastroesophageal reflux disease/esophagitis, hiatal hernia, gout, type 2 diabetes with nephropathy and chronic anemia in the setting of renal failure; who presented to the [name] secondary to abdominal pain, nausea, vomiting and coffee-ground emesis.  Patient currently in nursing facility for rehabilitation purposes after recent admission due to acute on chronic heart failure exacerbation and UTI.  Apparently patient complains of sudden episode of nausea/vomiting with subsequent coffee-ground emesis x2 and nursing home.  Associated symptoms included abdominal pain, nonradiating, mid epigastric region, 5 out of 10 in intensity.  All her symptoms present on the day of admission.  Patient chronically on anticoagulation and compliant with medications prior to admission. She denies chest pain, headaches, blurred vision, focal weakness, dysuria, hematuria, melena, hematochezia, shortness of breath, worsening wheezing, palpitations, cough or any other complaints.  In the ED patient's hemoglobin was found to be slightly lower than baseline (at 7.8), renal function higher than from recent discharge at 3.76 and a BUN of 97.  No further episode of vomiting since she was in the emergency department.  Antiemetics have been provided.  TRH call to place patient in observation for further evaluation and management of coffee-ground emesis.  Urinalysis demonstrated pyuria but  no other symptoms suggesting acute UTI.  Patient was  type and screen, gentle fluid resuscitation given and started on PPI.  Past Medical/Surgical History: Past Medical History:  Diagnosis Date  . Blood transfusion without reported diagnosis   . CAD (coronary artery disease)    Nonobstructive at cardiac catheterization 2000  . Cataract   . Cervical cancer ([place]) 1978  . CHF (congestive heart failure) ([place])   . Chronic back pain   . Chronic kidney disease   . COPD (chronic obstructive pulmonary disease) ([place])   . Degenerative disc disease   . Essential hypertension, benign   . GERD (gastroesophageal reflux disease)   . Gout   . Hiatal hernia 07/27/2013  . History of diverticulitis of colon   . History of hiatal hernia   . Iron deficiency anemia   . Irritable bowel syndrome   . Lumbar radiculopathy   . Mixed hyperlipidemia   . Neuropathy   . Osteoporosis   . Ovarian cancer [name]) 1978   patient denies. States this was cervical cancer  . Oxygen deficiency   . Sleep apnea   . Type 2 diabetes mellitus ([place])   . Vitamin B deficiency 12/25/2009  . Vitamin B12 deficiency     Past Surgical History:  Procedure Laterality Date  . ABDOMINAL HYSTERECTOMY    . BACK SURGERY    . Benign breast cysts    . CHOLECYSTECTOMY    . COLONOSCOPY  10/01/2006   SLF:Pan colonic diverticulosis and moderate internal hemorrhoids/ Otherwise no polyps, masses, inflammatory changes or AVMs/  . COLONOSCOPY  2011   SLF: pancolonic diverticulosis, large internal hemorrhoids  . COLONOSCOPY N/A 01/26/2016   Procedure: COLONOSCOPY;  Surgeon: [name], MD;  Location: [facility];  Service: Endoscopy;  Laterality: N/A;  830   . ESOPHAGOGASTRODUODENOSCOPY  11/19/2006  SLF: Large hiatal hernia without evidence of Cameron ulcers/. Distal esophageal stricture, which allowed the gastroscope to pass without resistance.  A 16 mm Savary later passed with mild resistance/ Normal stomach.sb bx negative  .  ESOPHAGOGASTRODUODENOSCOPY  10/01/2006   LEX:NTZGY hiatal hernia.  Distal esophagus without evidence of   erythema, ulceration or Barrett's esophagus  . ESOPHAGOGASTRODUODENOSCOPY  2011   SLF: large hh, distal esophageal web narrowing to 27mm s/p dilation to 74mm  . ESOPHAGOGASTRODUODENOSCOPY N/A 08/06/2013   SLF: 1. Stricture at the gastroesophageal junction 2. large hiatal hernia. 3. Mild erosive gastritis.  [name] GIVENS CAPSULE STUDY N/A 08/06/2013   INCOMPLETE-SMALL BOWLE ULCERS  . KNEE SURGERY Right   . PARTIAL HYSTERECTOMY  1978  . small bowel capsule  2008   negative  . SPINE SURGERY    . TONSILLECTOMY AND ADENOIDECTOMY    . Two back surgeries/fusion    . UMBILICAL HERNIA REPAIR  2010    Social History:  reports that she quit smoking about 57 years ago. Her smoking use included cigarettes. She started smoking about 58 years ago. She has a 1.00 pack-year smoking history. She has never used smokeless tobacco. She reports that she does not drink alcohol or use drugs.  Allergies: No Known Allergies  Family History:  Family History  Problem Relation Age of Onset  . Colon cancer Brother        diagnosed age 21. Living.   [name] Ulcers Sister   . Diabetes Sister   . Heart attack Sister   . Kidney failure Sister   . Stroke Sister   . Ulcers Mother   . Diabetes Mother   . Heart attack Mother   . Stroke Mother   . Asthma Mother   . Heart disease Mother   . Cervical cancer Mother   . Heart attack Brother   . Heart disease Brother   . Asthma Sister   . Diabetes Brother   . Stroke Maternal Grandmother   . Heart attack Maternal Grandmother   . Heart attack Other   . Early death Father        MVA in his 61s    Prior to Admission medications   Medication Sig Start Date End Date Taking? Authorizing Provider  albuterol (PROVENTIL) (2.5 MG/3ML) 0.083% nebulizer solution Take 2.5 mg by nebulization every 6 (six) hours as needed for wheezing or shortness of breath.  04/09/19   [provider]  albuterol (VENTOLIN HFA) 108 (90 Base) MCG/ACT inhaler Inhale 2 puffs into the lungs every 6 (six) hours as needed for wheezing or shortness of breath.  04/09/19   [provider]  allopurinol (ZYLOPRIM) 100 MG tablet Take 100 mg by mouth 2 (two) times daily.  10/09/12   [provider]  apixaban (ELIQUIS) 5 MG TABS tablet Take 1 tablet (5 mg total) by mouth 2 (two) times daily. 06/21/19   Johnson, Clanford L, MD  aspirin EC 81 MG tablet Take 81 mg by mouth daily.    [provider]  atorvastatin (LIPITOR) 40 MG tablet Take 1 tablet (40 mg total) by mouth at bedtime. 05/04/18   Satira Sark, MD  Biotin 5 MG CAPS Take 5 mg by mouth daily.    [provider]  Cholecalciferol 1.25 MG (50000 UT) capsule Take 50,000 Units by mouth. Once a day on Thursday 07/15/19 07/22/19  [provider]  diltiazem (CARDIZEM CD) 120 MG 24 hr capsule Take 1 capsule (120 mg total) by mouth daily.  06/22/19   Johnson, Clanford L, MD  esomeprazole (NEXIUM) 40 MG capsule TAKE 1 CAPSULE TWICE A DAY 30 MINUTES PRIOR TO MEALS 01/19/18   Annitta Needs, NP  famotidine (PEPCID) 40 MG tablet TAKE 1 TABLET AT BEDTIME AS NEEDED TO CONTROL REFLUX 06/23/15   Mannam, Praveen, MD  feeding supplement, GLUCERNA SHAKE, (GLUCERNA SHAKE) LIQD Take 237 mLs by mouth 2 (two) times daily between meals.    [provider]  hydrALAZINE (APRESOLINE) 25 MG tablet Take 25 mg by mouth 2 (two) times daily.    [provider]  LANTUS SOLOSTAR 100 UNIT/ML SOPN Inject 8 Units into the skin at bedtime.  10/06/12   [provider]  loperamide (IMODIUM) 2 MG capsule Take 2 mg by mouth every 6 (six) hours as needed for diarrhea or loose stools.     [provider]  metoprolol succinate (TOPROL-XL) 25 MG 24 hr tablet Take 25 mg by mouth daily. Along with 50 mg to = 75 mg    [provider]  metoprolol succinate (TOPROL-XL) 50 MG 24 hr tablet Take 50 mg by mouth  daily. Along with 25 mg to = 75 mg    [provider]  NON FORMULARY Diet: Regular, NAS Consistent Carbohydrate    [provider]  NONFORMULARY OR COMPOUNDED ITEM May use CPaP from home at previous home settings while asleep. Every Shift Day, Evening, Night    [provider]  OXYGEN Inhale 2 L into the lungs. Every Shift    [provider]  potassium chloride SA (KLOR-CON) 10 MEQ tablet Take 1 tablet (10 mEq total) by mouth every other day. 07/08/19   Johnson, Clanford L, MD  sertraline (ZOLOFT) 50 MG tablet Take 50 mg by mouth daily.    [provider]  tamsulosin (FLOMAX) 0.4 MG CAPS capsule Take 1 capsule (0.4 mg total) by mouth daily. 07/08/19   Johnson, Clanford L, MD  torsemide (DEMADEX) 10 MG tablet Take 5 tablets (50 mg total) by mouth daily. 07/09/19   Johnson, Clanford L, MD  TRELEGY ELLIPTA 100-62.5-25 MCG/INH AEPB Inhale 1 puff into the lungs daily. 05/13/19   [provider]  vitamin C (ASCORBIC ACID) 500 MG tablet Take 500 mg by mouth daily. 07/15/19 07/28/19  [provider]  zinc sulfate 220 (50 Zn) MG capsule Take 220 mg by mouth daily. 07/15/19 07/28/19  [provider]    Review of Systems:  Negative except as otherwise mentioned in HPI.   Physical Exam: Vitals:   07/17/19 1500 07/17/19 1530 07/17/19 1600 07/17/19 1700  BP: (!) 113/45 (!) 106/47 (!) 111/46 (!) 109/36  Pulse: (!) 102 (!) 101 (!) 101 (!) 107  Resp: 19 13 16  (!) 21  Temp:      TempSrc:      SpO2: 95% 94% 95% 92%  Weight:        Constitutional: Afebrile, no chest pain, still feeling nauseous.  No further vomiting since arrival to the ED.  Patient in no acute distress. Eyes: PERRL, lids and conjunctivae normal, no icterus. ENMT: Mucous membranes are moist. Posterior pharynx clear of any exudate or lesions. Neck: normal, supple, no masses, no thyromegaly, no JVD. Respiratory: clear to auscultation bilaterally, no wheezing, no  crackles. Normal respiratory effort. No accessory muscle use.  Cardiovascular: Regular rate and rhythm, no murmurs / rubs / gallops.  Bilateral trace edema on her lower extremities.    Abdomen: no tenderness, no masses palpated. No hepatosplenomegaly. Bowel sounds positive.  Musculoskeletal: no clubbing / cyanosis. No joint deformity upper and lower extremities. Good ROM, no contractures. Normal muscle tone.  Skin: no rashes, lesions, ulcers. No induration Neurologic: CN 2-12 grossly intact. Sensation intact, DTR normal. Strength 5/5 in all 4.  Psychiatric: Normal judgment and insight. Alert and oriented x 3. Normal mood.    Labs on Admission: I have personally reviewed the following labs and imaging studies  CBC: Recent Labs  Lab 07/17/19 0940  WBC 11.1*  NEUTROABS 9.6*  HGB 7.8*  HCT 24.8*  MCV 88.6  PLT 341   Basic Metabolic Panel: Recent Labs  Lab 07/16/19 0430 07/17/19 0940  NA 140 139  K 4.4 4.4  CL 92* 92*  CO2 33* 31  GLUCOSE 121* 142*  BUN 96* 97*  CREATININE 3.75* 3.76*  CALCIUM 7.8* 7.7*   GFR: Estimated Creatinine Clearance: 11.6 mL/min (A) (by C-G formula based on SCr of 3.76 mg/dL (H)).   Liver Function Tests: Recent Labs  Lab 07/17/19 0940  AST 12*  ALT 10  ALKPHOS 61  BILITOT 1.2  PROT 6.7  ALBUMIN 2.3*   Recent Labs  Lab 07/17/19 0940  LIPASE 33   Urine analysis:    Component Value Date/Time   COLORURINE YELLOW 07/17/2019 0940   APPEARANCEUR TURBID (A) 07/17/2019 0940   LABSPEC 1.011 07/17/2019 0940   PHURINE 7.0 07/17/2019 0940   GLUCOSEU NEGATIVE 07/17/2019 0940   HGBUR SMALL (A) 07/17/2019 0940   BILIRUBINUR NEGATIVE 07/17/2019 0940   KETONESUR 5 (A) 07/17/2019 0940   PROTEINUR 100 (A) 07/17/2019 0940   UROBILINOGEN 0.2 01/29/2007 1500   NITRITE NEGATIVE 07/17/2019 0940   LEUKOCYTESUR LARGE (A) 07/17/2019 0940    Recent Results (from the past 240 hour(s))  SARS CORONAVIRUS 2 (TAT 6-24 HRS)     Status: None   Collection  Time: 07/11/19  9:32 PM  Result Value Ref Range Status   SARS Coronavirus 2 NEGATIVE NEGATIVE Final    Comment: (NOTE) SARS-CoV-2 target nucleic acids are NOT DETECTED. The SARS-CoV-2 RNA is generally detectable in upper and lower respiratory specimens during the acute phase of infection. Negative results do not preclude SARS-CoV-2 infection, do not rule out co-infections with other pathogens, and should not be used as the sole basis for treatment or other patient management decisions. Negative results must be combined with clinical observations, patient history, and epidemiological information. The expected result is Negative. Fact Sheet for Patients: SugarRoll.be Fact Sheet for Healthcare Providers: https://www.woods-mathews.com/ This test is not yet approved or cleared by the [place] FDA and  has been authorized for detection and/or diagnosis of SARS-CoV-2 by FDA under an Emergency Use Authorization (EUA). This EUA will remain  in effect (meaning this test can be used) for the duration of the COVID-19 declaration under Section 56 4(b)(1) of the Act, 21 U.S.C. section 360bbb-3(b)(1), unless the authorization is terminated or revoked sooner. Performed at [name], [place], [place], [place] [address]      Radiological Exams on Admission: CT ABDOMEN PELVIS WO CONTRAST  Result Date: 07/17/2019 CLINICAL DATA:  Abdominal pain, nausea and vomiting EXAM: CT ABDOMEN AND PELVIS WITHOUT CONTRAST TECHNIQUE: Multidetector CT imaging of the abdomen and pelvis was performed following the standard protocol without IV contrast. COMPARISON:  None. FINDINGS: Lower chest: Increased bibasilar bandlike opacities consistent with atelectasis. Right middle lobe indeterminate small nodules noted, series 4, images 3 and 7. These measure 5 mm. Stable cardiomegaly. No pericardial or pleural effusion. Large hiatal hernia again noted  with the  majority of the stomach in the chest. Hepatobiliary: Limited without IV contrast. Remote cholecystectomy. No intrahepatic biliary dilatation or large focal hepatic abnormality. Pancreas: Unremarkable. No pancreatic ductal dilatation or surrounding inflammatory changes. Spleen: Normal in size without focal abnormality. Adrenals/Urinary Tract: Normal adrenal glands. Kidneys demonstrate mild chronic perinephric strandy edema. Renal obstruction or hydronephrosis. No hydroureter or obstructing ureteral calculus adjacent chronic calcified gonadal vein phleboliths noted on the left. Urinary bladder unremarkable. Stomach/Bowel: Large hiatal hernia again noted. Majority of stomach in the chest. Negative for obstruction, significant dilatation, ileus, or free air. Extensive colonic diverticulosis without acute focal inflammatory process or wall thickening. No fluid collection, hemorrhage, hematoma, abscess or ascites. Vascular/Lymphatic: Abdominal atherosclerosis noted. Negative for aneurysm. No retroperitoneal hemorrhage or hematoma. No bulky adenopathy. Reproductive: Status post hysterectomy. No adnexal masses. Other: No abdominal wall hernia or abnormality. No abdominopelvic ascites. Musculoskeletal: Remote lumbar spinal fusion. No acute osseous finding. Improved body anasarca compared to the prior study. IMPRESSION: Stable large hiatal hernia with majority of the stomach in the chest. Increased bibasilar atelectasis Right middle lobe 5 mm pulmonary nodules remain indeterminate. No follow-up needed if patient is low-risk. Non-contrast chest CT can be considered in 12 months if patient is high-risk. This recommendation follows the consensus statement: Guidelines for Management of Incidental Pulmonary Nodules Detected on CT Images: From the Fleischner Society 2017; Radiology 2017; 284:228-243. Diverticulosis without acute inflammatory process Improved body anasarca Electronically Signed   By: Jerilynn Mages.  Shick M.D.   On: 07/17/2019  13:06   DG Chest Portable 1 View  Result Date: 07/17/2019 CLINICAL DATA:  Vomiting, abdominal pain. EXAM: PORTABLE CHEST 1 VIEW COMPARISON:  July 05, 2019. FINDINGS: Stable cardiomediastinal silhouette. Right internal jugular Port-A-Cath is unchanged. No pneumothorax or pleural effusion is noted. No acute pulmonary disease is noted. Bony thorax is unremarkable. Stable hiatal hernia. IMPRESSION: No acute cardiopulmonary abnormality seen.  Stable hiatal hernia. Electronically Signed   By: Marijo Conception M.D.   On: 07/17/2019 10:53   DG Abd Portable 1 View  Result Date: 07/17/2019 CLINICAL DATA:  Weakness, vomiting. EXAM: PORTABLE ABDOMEN - 1 VIEW COMPARISON:  September 03, 2010. FINDINGS: The bowel gas pattern is normal. No radio-opaque calculi or other significant radiographic abnormality are seen. IMPRESSION: Negative. Electronically Signed   By: Marijo Conception M.D.   On: 07/17/2019 10:53    EKG: Independently reviewed.  No acute ischemic changes appreciated; sinus tachycardia.  Premature atrial complexes seen.  Assessment/Plan 1-Coffee ground emesis: Patient reporting abdominal pain, nausea/vomiting -Sudden presentation in the last 24 hours -2 episodes in nursing home demonstrating coffee-ground emesis -No further vomiting episodes since arrival to the emergency department. -Patient chronically on aspirin and Eliquis -Will continue holding anticoagulation -Clear liquid diet for limited bowel rest -GI service consultation -Started on IV Protonix -N.p.o. after midnight. -Hemoglobin lower than at baseline; but overall stable and not requiring transfusion currently. -Patient is hemodynamically stable. -As needed antiemetic has been ordered. -Follow hemoglobin trend.  2-OSA on CPAP -Continue CPAP nightly  3-HLD (hyperlipidemia) -Continue statins  4-Hiatal hernia with GERD and esophagitis -Patient with prior endoscopy also demonstrating gastritis -Anticoagulation has been  held -As mentioned above is started on PPI twice a day and Pepcid nightly. -Follow GI service recommendations.  5-Type 2 diabetes with nephropathy (HCC) -Continue sliding scale insulin and adjusted dose of Lantus -Follow CBGs closely  6-Obesity, Class III, BMI 40-49.9 (morbid obesity) (HCC) -Body mass index is 42.01 kg/m. -Low calorie diet, portion control and increase physical  activity discussed with patient.  7-essential hypertension -Stable overall -Continue current dose of Toprol, Cardizem, hydralazine and Flomax.  8-chronic respiratory failure with COPD (chronic obstructive pulmonary disease) (HCC) -No signs of acute exacerbation at this time -Continue oxygen supplementation (2 L nasal cannula) -Continue home inhaler regimen.  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 -Close monitoring of patient's renal function.  10-depression/anxiety -No suicidal ideation or hallucination-continue Zoloft  11-history of gout -No acute flare -Continue allopurinol.  12-pyuria -Patient denies dysuria -Recent treatment for UTI including IV meropenem and fosfomycin provided -Culture has been taken; hold antibiotics currently -Normal WBCs and no fever or other complaints suggesting infection.  DVT prophylaxis: SCDs Code Status: Full code Family Communication: No family at bedside. Disposition Plan: Discharge back to skilled nursing facility once coffee-ground emesis evaluation completed. Consults called: GI service.  Admission status: LOS < 2 midnights, telemetry, observation status.    Time Spent: 70 minutes.   Barton Dubois MD [facility] Pager [phone]  07/17/2019, 5:56 PM

## 2019-07-17 NOTE — ED Provider Notes (Signed)
[facility] EMERGENCY DEPARTMENT Provider Note   CSN: [id number] Arrival date & time: 07/17/19  6812     History Chief Complaint  Patient presents with  . Emesis  . Abdominal Pain    [name] is a 73 y.o. female.  a 73 y.o.femalewith medical history significant fordiastolic CHF, CKD 3, OSA on CPAP, COPD, atrial fibrillation, coronary artery disease, diabetes mellitus and hypertension.  Patient with recent admission for altered mental status, CHF and UTI.  Brought from nursing center this morning with abdominal pain and vomiting.  States she woke up with left-sided lower abdominal pain on multiple episodes of emesis which has been yellow in color.  Nursing home staff reported it was "bilious" and "coffee grounds".  She has intermittent left-sided lower abdominal pain that comes and goes.  No diarrhea.  Last one was 3 days ago.  Denies fever.  Denies chest pain or shortness of breath.  She is chronically on oxygen and feels like her breathing is at baseline.  Denies any pain with urination or blood in the urine.  She is not certain whether she was completely treated for her UTI during her previous hospitalization.  Previous cholecystectomy and hysterectomy.  The history is provided by the patient and the EMS personnel.  Emesis Associated symptoms: abdominal pain   Associated symptoms: no arthralgias, no diarrhea, no fever, no headaches and no myalgias   Abdominal Pain Associated symptoms: nausea and vomiting   Associated symptoms: no diarrhea, no dysuria, no fever, no hematuria and no shortness of breath        Past Medical History:  Diagnosis Date  . Blood transfusion without reported diagnosis   . CAD (coronary artery disease)    Nonobstructive at cardiac catheterization 2000  . Cataract   . Cervical cancer ([place]) 1978  . CHF (congestive heart failure) ([place])   . Chronic back pain   . Chronic kidney disease   . COPD (chronic obstructive pulmonary disease) ([place])     . Degenerative disc disease   . Essential hypertension, benign   . GERD (gastroesophageal reflux disease)   . Gout   . Hiatal hernia 07/27/2013  . History of diverticulitis of colon   . History of hiatal hernia   . Iron deficiency anemia   . Irritable bowel syndrome   . Lumbar radiculopathy   . Mixed hyperlipidemia   . Neuropathy   . Osteoporosis   . Ovarian cancer [facility]) 1978   patient denies. States this was cervical cancer  . Oxygen deficiency   . Sleep apnea   . Type 2 diabetes mellitus ([place])   . Vitamin B deficiency 12/25/2009  . Vitamin B12 deficiency     Patient Active Problem List   Diagnosis Date Noted  . Anorexia 07/10/2019  . Urinary retention, Female 07/07/2019  . Elevated troponin 07/07/2019  . AKI (acute kidney injury) ([employer]) 07/06/2019  . Hypoxia 07/05/2019  . Weight gain with edema 07/02/2019  . 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 CKD stage 3 due to type 2 diabetes mellitus ([place]) 06/22/2019  . Neuropathy due to type 2 diabetes mellitus ([place]) 06/22/2019  . Dyslipidemia associated with type 2 diabetes mellitus ([place]) 06/22/2019  . Chronic gout 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 06/22/2019  . Quality of life palliative care encounter 06/19/2019  . Atrial fibrillation with RVR ([place])   . Atrial flutter with rapid ventricular response ([place]) 06/16/2019  .  Dyspnea and respiratory abnormalities 06/01/2019  . Acute on chronic respiratory failure with hypoxia and hypercapnia with respiratory acidosis 06/01/2019  . Anasarca 05/31/2019  . Nausea without vomiting 05/31/2019  . Generalized abdominal pain 05/31/2019  . History of colonic polyps 05/31/2019  . Lymphedema of both lower extremities 04/28/2019  . Chronic respiratory failure with hypoxia ([place]) 07/13/2018  . Acute renal failure superimposed on stage 3 chronic kidney disease ([place]) 07/13/2018  . Chronic  diastolic CHF (congestive heart failure) ([place]) 07/13/2018  . COPD (chronic obstructive pulmonary disease) ([place]) 07/13/2018  . COPD exacerbation ([place]) 07/12/2018  . Flatulence 09/18/2017  . IBS (irritable bowel syndrome) 04/03/2017  . Gout 03/15/2017  . Acute on chronic diastolic CHF (congestive heart failure) ([place]) 05/08/2016  . CKD (chronic kidney disease) stage 3, GFR 30-59 ml/min 05/08/2016  . COPD with acute exacerbation ([place]) 05/08/2016  . Constipation 04/25/2016  . Peripheral vertigo 06/27/2015  . Port-A-Cath in place 10/06/2014  . Chronic obstructive pulmonary disease ([place]) 04/30/2014  . Obesity, Class III, BMI 40-49.9 (morbid obesity) ([place]) 04/30/2014  . Hemorrhoids, internal, with bleeding 11/10/2013  . Anemia 08/18/2013  . Hiatal hernia with GERD and esophagitis   . Obstructive chronic bronchitis without exacerbation ([place]) 12/26/2009  . Dysphagia 12/26/2009  . DM type 2 with diabetic peripheral neuropathy ([place]) 12/25/2009  . Vitamin B deficiency 12/25/2009  . Iron deficiency anemia due to chronic blood loss 12/25/2009  . Benign essential HTN 12/25/2009  . DEGENERATIVE DISC DISEASE 12/25/2009  . OSA on CPAP 12/25/2009  . HLD (hyperlipidemia) 12/25/2009  . Type 2 diabetes with nephropathy ([place]) 12/25/2009    Past Surgical History:  Procedure Laterality Date  . ABDOMINAL HYSTERECTOMY    . BACK SURGERY    . Benign breast cysts    . CHOLECYSTECTOMY    . COLONOSCOPY  10/01/2006   SLF:Pan colonic diverticulosis and moderate internal hemorrhoids/ Otherwise no polyps, masses, inflammatory changes or AVMs/  . COLONOSCOPY  2011   SLF: pancolonic diverticulosis, large internal hemorrhoids  . COLONOSCOPY N/A 01/26/2016   Procedure: COLONOSCOPY;  Surgeon: [name], MD;  Location: [facility] SUITE;  Service: Endoscopy;  Laterality: N/A;  830   . ESOPHAGOGASTRODUODENOSCOPY  11/19/2006   SLF: Large hiatal hernia without evidence of [place] ulcers/. Distal esophageal stricture,  which allowed the gastroscope to pass without resistance.  A 16 mm [place] later passed with mild resistance/ Normal stomach.sb bx negative  . ESOPHAGOGASTRODUODENOSCOPY  10/01/2006   MGQ:QPYPP hiatal hernia.  Distal esophagus without evidence of   erythema, ulceration or Barrett's esophagus  . ESOPHAGOGASTRODUODENOSCOPY  2011   SLF: large hh, distal esophageal web narrowing to 82mm s/p dilation to 61mm  . ESOPHAGOGASTRODUODENOSCOPY N/A 08/06/2013   SLF: 1. Stricture at the gastroesophageal junction 2. large hiatal hernia. 3. Mild erosive gastritis.  Marland Kitchen GIVENS CAPSULE STUDY N/A 08/06/2013   INCOMPLETE-SMALL BOWLE ULCERS  . KNEE SURGERY Right   . PARTIAL HYSTERECTOMY  1978  . small bowel capsule  2008   negative  . SPINE SURGERY    . TONSILLECTOMY AND ADENOIDECTOMY    . Two back surgeries/fusion    . UMBILICAL HERNIA REPAIR  2010     OB History   No obstetric history on file.     Family History  Problem Relation Age of Onset  . Colon cancer Brother        diagnosed age 63. Living.   Marland Kitchen Ulcers Sister   . Diabetes Sister   . Heart attack Sister   .  Kidney failure Sister   . Stroke Sister   . Ulcers Mother   . Diabetes Mother   . Heart attack Mother   . Stroke Mother   . Asthma Mother   . Heart disease Mother   . Cervical cancer Mother   . Heart attack Brother   . Heart disease Brother   . Asthma Sister   . Diabetes Brother   . Stroke Maternal Grandmother   . Heart attack Maternal Grandmother   . Heart attack Other   . Early death Father        MVA in his 28s    Social History   Tobacco Use  . Smoking status: Former Smoker    Packs/day: 1.00    Years: 1.00    Pack years: 1.00    Types: Cigarettes    Start date: 02/19/1961    Quit date: 08/05/1961    Years since quitting: 57.9  . Smokeless tobacco: Never Used  . Tobacco comment: Quit smoking x 50 years  Substance Use Topics  . Alcohol use: No  . Drug use: No    Home Medications Prior to Admission medications     Medication Sig Start Date End Date Taking? Authorizing Provider  albuterol (PROVENTIL) (2.5 MG/3ML) 0.083% nebulizer solution Take 2.5 mg by nebulization every 6 (six) hours as needed for wheezing or shortness of breath.  04/09/19   [provider]  albuterol (VENTOLIN HFA) 108 (90 Base) MCG/ACT inhaler Inhale 2 puffs into the lungs every 6 (six) hours as needed for wheezing or shortness of breath.  04/09/19   [provider]  allopurinol (ZYLOPRIM) 100 MG tablet Take 100 mg by mouth 2 (two) times daily.  10/09/12   [provider]  apixaban (ELIQUIS) 5 MG TABS tablet Take 1 tablet (5 mg total) by mouth 2 (two) times daily. 06/21/19   Johnson, Clanford L, MD  aspirin EC 81 MG tablet Take 81 mg by mouth daily.    [provider]  atorvastatin (LIPITOR) 40 MG tablet Take 1 tablet (40 mg total) by mouth at bedtime. 05/04/18   Satira Sark, MD  Biotin 5 MG CAPS Take 5 mg by mouth daily.    [provider]  Cholecalciferol 1.25 MG (50000 UT) capsule Take 50,000 Units by mouth. Once a day on Thursday 07/15/19 07/22/19  [provider]  diltiazem (CARDIZEM CD) 120 MG 24 hr capsule Take 1 capsule (120 mg total) by mouth daily. 06/22/19   Johnson, Clanford L, MD  esomeprazole (NEXIUM) 40 MG capsule TAKE 1 CAPSULE TWICE A DAY 30 MINUTES PRIOR TO MEALS 01/19/18   Annitta Needs, NP  famotidine (PEPCID) 40 MG tablet TAKE 1 TABLET AT BEDTIME AS NEEDED TO CONTROL REFLUX 06/23/15   Mannam, Praveen, MD  feeding supplement, GLUCERNA SHAKE, (GLUCERNA SHAKE) LIQD Take 237 mLs by mouth 2 (two) times daily between meals.    [provider]  hydrALAZINE (APRESOLINE) 25 MG tablet Take 25 mg by mouth 2 (two) times daily.    [provider]  LANTUS SOLOSTAR 100 UNIT/ML SOPN Inject 8 Units into the skin at bedtime.  10/06/12   [provider]  loperamide (IMODIUM) 2 MG capsule Take 2 mg by mouth every 6 (six) hours as needed for diarrhea or loose  stools.     [provider]  metoprolol succinate (TOPROL-XL) 25 MG 24 hr tablet Take 25 mg by mouth daily. Along with 50 mg to = 75 mg    [provider]  metoprolol succinate (TOPROL-XL) 50 MG 24 hr tablet Take 50 mg by mouth daily. Along with 25 mg to = 75 mg    [provider]  NON FORMULARY Diet: Regular, NAS Consistent Carbohydrate    [provider]  NONFORMULARY OR COMPOUNDED ITEM May use CPaP from home at previous home settings while asleep. Every Shift Day, Evening, Night    [provider]  OXYGEN Inhale 2 L into the lungs. Every Shift    [provider]  potassium chloride SA (KLOR-CON) 10 MEQ tablet Take 1 tablet (10 mEq total) by mouth every other day. 07/08/19   Johnson, Clanford L, MD  sertraline (ZOLOFT) 50 MG tablet Take 50 mg by mouth daily.    [provider]  tamsulosin (FLOMAX) 0.4 MG CAPS capsule Take 1 capsule (0.4 mg total) by mouth daily. 07/08/19   Johnson, Clanford L, MD  torsemide (DEMADEX) 10 MG tablet Take 5 tablets (50 mg total) by mouth daily. 07/09/19   Johnson, Clanford L, MD  TRELEGY ELLIPTA 100-62.5-25 MCG/INH AEPB Inhale 1 puff into the lungs daily. 05/13/19   [provider]  vitamin C (ASCORBIC ACID) 500 MG tablet Take 500 mg by mouth daily. 07/15/19 07/28/19  [provider]  zinc sulfate 220 (50 Zn) MG capsule Take 220 mg by mouth daily. 07/15/19 07/28/19  [provider]    Allergies    Patient has no known allergies.  Review of Systems   Review of Systems  Constitutional: Positive for activity change and appetite change. Negative for fever.  HENT: Negative for congestion and rhinorrhea.   Respiratory: Negative for chest tightness and shortness of breath.   Gastrointestinal: Positive for abdominal pain, nausea and vomiting. Negative for diarrhea.  Genitourinary: Negative for dysuria and hematuria.  Musculoskeletal: Negative for arthralgias and myalgias.    Neurological: Positive for weakness. Negative for dizziness and headaches.   all other systems are negative except as noted in the HPI and PMH.    Physical Exam Updated Vital Signs BP (!) 141/57 (BP Location: Left Arm)   Pulse (!) 110   Temp 99.5 F (37.5 C) (Rectal)   Resp 18   Wt 85 kg   SpO2 94%   BMI 42.01 kg/m   Physical Exam Vitals and nursing note reviewed.  Constitutional:      General: She is not in acute distress.    Appearance: She is well-developed. She is obese. She is ill-appearing.     Comments: Chronically ill appearing No increased work of breathing  HENT:     Head: Normocephalic and atraumatic.     Mouth/Throat:     Pharynx: No oropharyngeal exudate.  Eyes:     Conjunctiva/sclera: Conjunctivae normal.     Pupils: Pupils are equal, round, and reactive to light.  Neck:     Comments: No meningismus. Cardiovascular:     Rate and Rhythm: Regular rhythm. Tachycardia present.     Heart sounds: Normal heart sounds. No murmur.     Comments: Diminished breath sounds bilaterally Pulmonary:     Effort: Pulmonary effort is normal. No respiratory distress.     Breath sounds: Normal breath sounds.  Abdominal:     Palpations: Abdomen is soft.     Tenderness: There is abdominal tenderness. There is no guarding or rebound.     Comments: Soft. TTP LLQ. No guarding or rebound  Musculoskeletal:        General: No tenderness. Normal range of motion.  Cervical back: Normal range of motion and neck supple.  Skin:    General: Skin is warm.  Neurological:     Mental Status: She is alert and oriented to person, place, and time.     Cranial Nerves: No cranial nerve deficit.     Motor: No abnormal muscle tone.     Coordination: Coordination normal.     Comments:  5/5 strength throughout. CN 2-12 intact.Equal grip strength.   Psychiatric:        Behavior: Behavior normal.     ED Results / Procedures / Treatments   Labs (all labs ordered are listed, but only  abnormal results are displayed) Labs Reviewed  CBC WITH DIFFERENTIAL/PLATELET - Abnormal; Notable for the following components:      Result Value   WBC 11.1 (*)    RBC 2.80 (*)    Hemoglobin 7.8 (*)    HCT 24.8 (*)    RDW 16.9 (*)    Neutro Abs 9.6 (*)    All other components within normal limits  COMPREHENSIVE METABOLIC PANEL - Abnormal; Notable for the following components:   Chloride 92 (*)    Glucose, Bld 142 (*)    BUN 97 (*)    Creatinine, Ser 3.76 (*)    Calcium 7.7 (*)    Albumin 2.3 (*)    AST 12 (*)    GFR calc non Af Amer 11 (*)    GFR calc Af Amer 13 (*)    Anion gap 16 (*)    All other components within normal limits  TROPONIN I (HIGH SENSITIVITY) - Abnormal; Notable for the following components:   Troponin I (High Sensitivity) 19 (*)    All other components within normal limits  URINE CULTURE  LIPASE, BLOOD  LACTIC ACID, PLASMA  LACTIC ACID, PLASMA  URINALYSIS, ROUTINE W REFLEX MICROSCOPIC    EKG EKG Interpretation  Date/Time:  Saturday July 17 2019 09:33:59 EST Ventricular Rate:  109 PR Interval:    QRS Duration: 104 QT Interval:  321 QTC Calculation: 433 R Axis:   14 Text Interpretation: Sinus tachycardia Atrial premature complexes Posterior infarct, old Repol abnrm, probable ischemia, lateral leads T wave inversions similar to november Confirmed by Ezequiel Essex [id number]) on 07/17/2019 9:57:39 AM   Radiology CT ABDOMEN PELVIS WO CONTRAST  Result Date: 07/17/2019 CLINICAL DATA:  Abdominal pain, nausea and vomiting EXAM: CT ABDOMEN AND PELVIS WITHOUT CONTRAST TECHNIQUE: Multidetector CT imaging of the abdomen and pelvis was performed following the standard protocol without IV contrast. COMPARISON:  None. FINDINGS: Lower chest: Increased bibasilar bandlike opacities consistent with atelectasis. Right middle lobe indeterminate small nodules noted, series 4, images 3 and 7. These measure 5 mm. Stable cardiomegaly. No pericardial or pleural effusion.  Large hiatal hernia again noted with the majority of the stomach in the chest. Hepatobiliary: Limited without IV contrast. Remote cholecystectomy. No intrahepatic biliary dilatation or large focal hepatic abnormality. Pancreas: Unremarkable. No pancreatic ductal dilatation or surrounding inflammatory changes. Spleen: Normal in size without focal abnormality. Adrenals/Urinary Tract: Normal adrenal glands. Kidneys demonstrate mild chronic perinephric strandy edema. Renal obstruction or hydronephrosis. No hydroureter or obstructing ureteral calculus adjacent chronic calcified gonadal vein phleboliths noted on the left. Urinary bladder unremarkable. Stomach/Bowel: Large hiatal hernia again noted. Majority of stomach in the chest. Negative for obstruction, significant dilatation, ileus, or free air. Extensive colonic diverticulosis without acute focal inflammatory process or wall thickening. No fluid collection, hemorrhage, hematoma, abscess or ascites. Vascular/Lymphatic: Abdominal atherosclerosis noted. Negative for aneurysm. No  retroperitoneal hemorrhage or hematoma. No bulky adenopathy. Reproductive: Status post hysterectomy. No adnexal masses. Other: No abdominal wall hernia or abnormality. No abdominopelvic ascites. Musculoskeletal: Remote lumbar spinal fusion. No acute osseous finding. Improved body anasarca compared to the prior study. IMPRESSION: Stable large hiatal hernia with majority of the stomach in the chest. Increased bibasilar atelectasis Right middle lobe 5 mm pulmonary nodules remain indeterminate. No follow-up needed if patient is low-risk. Non-contrast chest CT can be considered in 12 months if patient is high-risk. This recommendation follows the consensus statement: Guidelines for Management of Incidental Pulmonary Nodules Detected on CT Images: From the Fleischner Society 2017; Radiology 2017; 284:228-243. Diverticulosis without acute inflammatory process Improved body anasarca Electronically  Signed   By: Jerilynn Mages.  Shick M.D.   On: 07/17/2019 13:06   DG Chest Portable 1 View  Result Date: 07/17/2019 CLINICAL DATA:  Vomiting, abdominal pain. EXAM: PORTABLE CHEST 1 VIEW COMPARISON:  July 05, 2019. FINDINGS: Stable cardiomediastinal silhouette. Right internal jugular Port-A-Cath is unchanged. No pneumothorax or pleural effusion is noted. No acute pulmonary disease is noted. Bony thorax is unremarkable. Stable hiatal hernia. IMPRESSION: No acute cardiopulmonary abnormality seen.  Stable hiatal hernia. Electronically Signed   By: Marijo Conception M.D.   On: 07/17/2019 10:53   DG Abd Portable 1 View  Result Date: 07/17/2019 CLINICAL DATA:  Weakness, vomiting. EXAM: PORTABLE ABDOMEN - 1 VIEW COMPARISON:  September 03, 2010. FINDINGS: The bowel gas pattern is normal. No radio-opaque calculi or other significant radiographic abnormality are seen. IMPRESSION: Negative. Electronically Signed   By: Marijo Conception M.D.   On: 07/17/2019 10:53    Procedures .Critical Care Performed by: Ezequiel Essex, MD Authorized by: Ezequiel Essex, MD   Critical care provider statement:    Critical care time (minutes):  35   Critical care was time spent personally by me on the following activities:  Discussions with consultants, evaluation of patient's response to treatment, examination of patient, ordering and performing treatments and interventions, ordering and review of laboratory studies, ordering and review of radiographic studies, pulse oximetry, re-evaluation of patient's condition, obtaining history from patient or surrogate and review of old charts   (including critical care time)  Medications Ordered in ED Medications - No data to display  ED Course  I have reviewed the triage vital signs and the nursing notes.  Pertinent labs & imaging results that were available during my care of the patient were reviewed by me and considered in my medical decision making (see chart for details).    MDM  Rules/Calculators/A&P     CHA2DS2/VAS Stroke Risk Points  Current as of 6 minutes ago     6 >= 2 Points: High Risk  1 - 1.99 Points: Medium Risk  0 Points: Low Risk    The patient's score has not changed in the past year.: No Change     Details    This score determines the patient's risk of having a stroke if the  patient has atrial fibrillation.       Points Metrics  1 Has Congestive Heart Failure:  Yes    Current as of 6 minutes ago  1 Has Vascular Disease:  Yes    Current as of 6 minutes ago  1 Has Hypertension:  Yes    Current as of 6 minutes ago  1 Age:  8    Current as of 6 minutes ago  1 Has Diabetes:  Yes    Current as of 6 minutes ago  0 Had Stroke:  No  Had TIA:  No  Had thromboembolism:  No    Current as of 6 minutes ago  1 Female:  Yes    Current as of 6 minutes ago                        Patient from nursing facility with abdominal pain and vomiting since this morning.  No fever.  Recently treated for UTI with fosfomycin.  Denies diarrhea.  Abdomen soft without peritoneal signs.  Labs show stable anemia and creatinine elevation.  Lactate is normal.  We will give gentle IV fluids and check x-ray.  No coffee ground emesis seen in the ED. EGD in 2015 showed gastritis and hiatal hernia.  CT abdomen and pelvis without acute findings.  UA with pyuria and but negative. Nitrite.   Plan observation admission for reported coffee ground emesis in setting of anticoagulation use. Continue IVF and PPI.   D/w Dr. Dyann Kief. He requests to hold antibiotics at this time given patient's recent treatment of enterobacter UTI. Final Clinical Impression(s) / ED Diagnoses Final diagnoses:  Coffee ground emesis    Rx / DC Orders ED Discharge Orders    None       Maryan Sivak, Annie Main, MD 07/17/19 1743

## 2019-07-17 NOTE — ED Triage Notes (Addendum)
Pt brought from [facility] center due to abdominal pain and vomiting. Pt reports waking up with pain and vomiting Pt is vomit bile like substance. No diarrhea. Last good BM 3 days ago. Given zofran at [facility] prior to arrival. Rapid covid at [facility] was negative per EMS

## 2019-07-17 NOTE — ED Notes (Signed)
Pt has not had any further vomiting since arrival

## 2019-07-18 DIAGNOSIS — Z7189 Other specified counseling: Secondary | ICD-10-CM | POA: Diagnosis not present

## 2019-07-18 DIAGNOSIS — Z6841 Body Mass Index (BMI) 40.0 and over, adult: Secondary | ICD-10-CM | POA: Diagnosis not present

## 2019-07-18 DIAGNOSIS — N170 Acute kidney failure with tubular necrosis: Secondary | ICD-10-CM | POA: Diagnosis not present

## 2019-07-18 DIAGNOSIS — Z20822 Contact with and (suspected) exposure to covid-19: Secondary | ICD-10-CM | POA: Diagnosis present

## 2019-07-18 DIAGNOSIS — I4819 Other persistent atrial fibrillation: Secondary | ICD-10-CM | POA: Diagnosis present

## 2019-07-18 DIAGNOSIS — E785 Hyperlipidemia, unspecified: Secondary | ICD-10-CM | POA: Diagnosis not present

## 2019-07-18 DIAGNOSIS — N184 Chronic kidney disease, stage 4 (severe): Secondary | ICD-10-CM | POA: Diagnosis not present

## 2019-07-18 DIAGNOSIS — K2901 Acute gastritis with bleeding: Secondary | ICD-10-CM | POA: Diagnosis not present

## 2019-07-18 DIAGNOSIS — K449 Diaphragmatic hernia without obstruction or gangrene: Secondary | ICD-10-CM | POA: Diagnosis present

## 2019-07-18 DIAGNOSIS — K922 Gastrointestinal hemorrhage, unspecified: Secondary | ICD-10-CM | POA: Diagnosis present

## 2019-07-18 DIAGNOSIS — I4891 Unspecified atrial fibrillation: Secondary | ICD-10-CM | POA: Diagnosis not present

## 2019-07-18 DIAGNOSIS — E782 Mixed hyperlipidemia: Secondary | ICD-10-CM | POA: Diagnosis present

## 2019-07-18 DIAGNOSIS — J449 Chronic obstructive pulmonary disease, unspecified: Secondary | ICD-10-CM | POA: Diagnosis present

## 2019-07-18 DIAGNOSIS — R1310 Dysphagia, unspecified: Secondary | ICD-10-CM | POA: Diagnosis not present

## 2019-07-18 DIAGNOSIS — N390 Urinary tract infection, site not specified: Secondary | ICD-10-CM | POA: Insufficient documentation

## 2019-07-18 DIAGNOSIS — R579 Shock, unspecified: Secondary | ICD-10-CM | POA: Diagnosis not present

## 2019-07-18 DIAGNOSIS — K920 Hematemesis: Secondary | ICD-10-CM | POA: Diagnosis present

## 2019-07-18 DIAGNOSIS — M109 Gout, unspecified: Secondary | ICD-10-CM | POA: Diagnosis present

## 2019-07-18 DIAGNOSIS — R1314 Dysphagia, pharyngoesophageal phase: Secondary | ICD-10-CM | POA: Diagnosis not present

## 2019-07-18 DIAGNOSIS — J9611 Chronic respiratory failure with hypoxia: Secondary | ICD-10-CM | POA: Diagnosis present

## 2019-07-18 DIAGNOSIS — K222 Esophageal obstruction: Secondary | ICD-10-CM | POA: Diagnosis not present

## 2019-07-18 DIAGNOSIS — K2211 Ulcer of esophagus with bleeding: Secondary | ICD-10-CM | POA: Diagnosis present

## 2019-07-18 DIAGNOSIS — G4733 Obstructive sleep apnea (adult) (pediatric): Secondary | ICD-10-CM | POA: Diagnosis present

## 2019-07-18 DIAGNOSIS — E440 Moderate protein-calorie malnutrition: Secondary | ICD-10-CM | POA: Diagnosis not present

## 2019-07-18 DIAGNOSIS — K209 Esophagitis, unspecified without bleeding: Secondary | ICD-10-CM | POA: Diagnosis not present

## 2019-07-18 DIAGNOSIS — R32 Unspecified urinary incontinence: Secondary | ICD-10-CM | POA: Diagnosis present

## 2019-07-18 DIAGNOSIS — E1122 Type 2 diabetes mellitus with diabetic chronic kidney disease: Secondary | ICD-10-CM | POA: Diagnosis present

## 2019-07-18 DIAGNOSIS — K2101 Gastro-esophageal reflux disease with esophagitis, with bleeding: Secondary | ICD-10-CM | POA: Diagnosis present

## 2019-07-18 DIAGNOSIS — N186 End stage renal disease: Secondary | ICD-10-CM | POA: Diagnosis present

## 2019-07-18 DIAGNOSIS — N179 Acute kidney failure, unspecified: Secondary | ICD-10-CM | POA: Diagnosis not present

## 2019-07-18 DIAGNOSIS — I130 Hypertensive heart and chronic kidney disease with heart failure and stage 1 through stage 4 chronic kidney disease, or unspecified chronic kidney disease: Secondary | ICD-10-CM | POA: Diagnosis not present

## 2019-07-18 DIAGNOSIS — E1121 Type 2 diabetes mellitus with diabetic nephropathy: Secondary | ICD-10-CM | POA: Diagnosis not present

## 2019-07-18 DIAGNOSIS — I340 Nonrheumatic mitral (valve) insufficiency: Secondary | ICD-10-CM | POA: Diagnosis not present

## 2019-07-18 DIAGNOSIS — D62 Acute posthemorrhagic anemia: Secondary | ICD-10-CM | POA: Diagnosis present

## 2019-07-18 DIAGNOSIS — F321 Major depressive disorder, single episode, moderate: Secondary | ICD-10-CM | POA: Diagnosis present

## 2019-07-18 DIAGNOSIS — I5032 Chronic diastolic (congestive) heart failure: Secondary | ICD-10-CM | POA: Diagnosis present

## 2019-07-18 DIAGNOSIS — I484 Atypical atrial flutter: Secondary | ICD-10-CM | POA: Diagnosis present

## 2019-07-18 DIAGNOSIS — I132 Hypertensive heart and chronic kidney disease with heart failure and with stage 5 chronic kidney disease, or end stage renal disease: Secondary | ICD-10-CM | POA: Diagnosis present

## 2019-07-18 DIAGNOSIS — I361 Nonrheumatic tricuspid (valve) insufficiency: Secondary | ICD-10-CM | POA: Diagnosis not present

## 2019-07-18 LAB — CBC
HCT: 26.5 % — ABNORMAL LOW (ref 36.0–46.0)
Hemoglobin: 8.1 g/dL — ABNORMAL LOW (ref 12.0–15.0)
MCH: 27.6 pg (ref 26.0–34.0)
MCHC: 30.6 g/dL (ref 30.0–36.0)
MCV: 90.1 fL (ref 80.0–100.0)
Platelets: 285 10*3/uL (ref 150–400)
RBC: 2.94 MIL/uL — ABNORMAL LOW (ref 3.87–5.11)
RDW: 17.2 % — ABNORMAL HIGH (ref 11.5–15.5)
WBC: 7.9 10*3/uL (ref 4.0–10.5)
nRBC: 0 % (ref 0.0–0.2)

## 2019-07-18 LAB — TYPE AND SCREEN
ABO/RH(D): O POS
Antibody Screen: NEGATIVE

## 2019-07-18 LAB — GLUCOSE, CAPILLARY
Glucose-Capillary: 106 mg/dL — ABNORMAL HIGH (ref 70–99)
Glucose-Capillary: 115 mg/dL — ABNORMAL HIGH (ref 70–99)
Glucose-Capillary: 106 mg/dL — ABNORMAL HIGH (ref 70–99)
Glucose-Capillary: 89 mg/dL (ref 70–99)

## 2019-07-18 LAB — BASIC METABOLIC PANEL
Anion gap: 10 (ref 5–15)
BUN: 94 mg/dL — ABNORMAL HIGH (ref 8–23)
CO2: 34 mmol/L — ABNORMAL HIGH (ref 22–32)
Calcium: 8 mg/dL — ABNORMAL LOW (ref 8.9–10.3)
Chloride: 99 mmol/L (ref 98–111)
Creatinine, Ser: 3.36 mg/dL — ABNORMAL HIGH (ref 0.44–1.00)
GFR calc Af Amer: 15 mL/min — ABNORMAL LOW (ref >60)
GFR calc non Af Amer: 13 mL/min — ABNORMAL LOW (ref >60)
Glucose, Bld: 111 mg/dL — ABNORMAL HIGH (ref 70–99)
Potassium: 4.2 mmol/L (ref 3.5–5.1)
Sodium: 143 mmol/L (ref 135–145)

## 2019-07-18 LAB — MRSA PCR SCREENING: MRSA by PCR: NEGATIVE

## 2019-07-18 LAB — SARS CORONAVIRUS 2 (TAT 6-24 HRS): SARS Coronavirus 2: NEGATIVE

## 2019-07-18 MED ORDER — CHLORHEXIDINE GLUCONATE CLOTH 2 % EX PADS
6.0000 | MEDICATED_PAD | Freq: Every day | CUTANEOUS | Status: DC
  Administered 2019-07-18 – 2019-08-10 (×23): 6 via TOPICAL

## 2019-07-18 NOTE — Progress Notes (Signed)
PROGRESS NOTE    [name]  TKP:[id number] DOB: 1945-11-12 DOA: 07/17/2019 PCP: [name], MD     Brief Narrative:  73 y.o. female with past medical history significant for diastolic heart failure, chronic kidney disease a stage IIIb; COPD, chronic respiratory failure with hypoxia, obesity, obstructive sleep apnea, atrial fibrillation (chronically on anticoagulation), gastroesophageal reflux disease/esophagitis, hiatal hernia, gout, type 2 diabetes with nephropathy and chronic anemia in the setting of renal failure; who presented to the hospital secondary to abdominal pain, nausea, vomiting and coffee-ground emesis.  Patient currently in nursing facility for rehabilitation purposes after recent admission due to acute on chronic heart failure exacerbation and UTI.  Apparently patient complains of sudden episode of nausea/vomiting with subsequent coffee-ground emesis x2 and nursing home.  Associated symptoms included abdominal pain, nonradiating, mid epigastric region, 5 out of 10 in intensity.  All her symptoms present on the day of admission.  Patient chronically on anticoagulation and compliant with medications prior to admission. She denies chest pain, headaches, blurred vision, focal weakness, dysuria, hematuria, melena, hematochezia, shortness of breath, worsening wheezing, palpitations, cough or any other complaints.  In the ED patient's hemoglobin was found to be slightly lower than baseline (at 7.8), renal function higher than from recent discharge at 3.76 and a BUN of 97.  No further episode of vomiting since she was in the emergency department.  Antiemetics have been provided.  TRH call to place patient in observation for further evaluation and management of coffee-ground emesis.  Urinalysis demonstrated pyuria but no other symptoms suggesting acute UTI.  Patient was  type and screen, gentle fluid resuscitation given and started on PPI.   Assessment & Plan: 1-abdominal  pain/nausea/coffee ground emesis -With concern for upper GI bleed. -Mostly gastritis/esophagitis -Hemoglobin has remained stable no further hematemesis appreciated -Continue IV PPI. -GI consulted and planning EGD tomorrow morning (07/19/2019). -Continue holding aspirin and anticoagulation.  2-OSA on CPAP -Continue CPAP nightly  3-HLD (hyperlipidemia) -Continue statins.  4-Hiatal hernia with GERD and esophagitis -As mentioned above continue IV PPI -Clear liquid diet.  5-Type 2 diabetes with nephropathy (HCC) -Continue sliding scale insulin adjusted dose of Lantus nightly -follow CBGs closely.  6-Obesity, Class III, BMI 40-49.9 (morbid obesity) (HCC) -Body mass index is 37.36 kg/m. -Low calorie diet and portion control discussed with patient.  7-COPD (chronic obstructive pulmonary disease) (HCC) -With chronic respiratory failure -No signs of acute exacerbation -Continue current oxygen supplementation -Continue home inhaler regimen.  8-essential hypertension -Overall stable -Continue Toprol, Cardizem, hydralazine and Flomax.  9-depression/anxiety -No suicidal ideation or hallucination -Continue Zoloft.  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 -Close monitoring of patient's renal function -Creatinine 3.3 currently.  11-pyuria -Patient continues denying dysuria -Continue supportive care monitor labs -no antibiotics at this time.  12-gout -no acute flare -continue allopurinol  DVT prophylaxis: SCDs Code Status: Full code Family Communication: No family at bedside. Disposition Plan: Remains inpatient, continue to follow hemoglobin trend.  Endoscopy anticipated on 07/19/2019.  Continue IV PPI  Consultants:   No GI service  Procedures:    Endoscopy planned for 07/19/2019  Antimicrobials:  Anti-infectives (From admission, onward)   None       Subjective: No further episode of vomiting; patient denies abdominal pain currently.  Afebrile, no chest pain, no melena or hematochezia.  Objective: Vitals:   07/17/19 2134  07/18/19 0500 07/18/19 0606 07/18/19 1120  BP:   133/80   Pulse: (!) 102  (!) 106   Resp: 16  17   Temp:   98.6 F (37 C)   TempSrc:   Oral   SpO2: 93%  99% 92%  Weight:  83.9 kg    Height:        Intake/Output Summary (Last 24 hours) at 07/18/2019 1224 Last data filed at 07/18/2019 0700 Gross per 24 hour  Intake 740.71 ml  Output 200 ml  Net 540.71 ml   Filed Weights   07/17/19 0933 07/17/19 2056 07/18/19 0500  Weight: 85 kg 83.1 kg 83.9 kg    Examination: General exam: Alert, awake, oriented x 3; denying chest pain, no further episode of vomiting.  Afebrile. Respiratory system: Good air movement bilaterally; no using accessory muscle.  Normal respiratory effort.  Good O2 sat on chronic oxygen supplementation.   Cardiovascular system: Rate controled. No rubs or gallops. Gastrointestinal system: Abdomen is obese, nondistended, soft and nontender. No organomegaly or masses felt. Normal bowel sounds heard. Central nervous system: Alert and oriented. No focal neurological deficits. Extremities: No Cyanosis, no clubbing. Skin: No rashes, no petechiae. Psychiatry: Judgement and insight appear normal. Mood & affect appropriate.     Data Reviewed: I have personally reviewed following labs and imaging studies  CBC: Recent Labs  Lab 07/17/19 0940 07/18/19 0803  WBC 11.1* 7.9  NEUTROABS 9.6*  --   HGB 7.8* 8.1*  HCT 24.8* 26.5*  MCV 88.6 90.1  PLT 317 702   Basic Metabolic Panel: Recent Labs  Lab 07/16/19 0430 07/17/19 0940 07/18/19 0803  NA 140 139 143  K 4.4 4.4 4.2  CL 92* 92* 99  CO2 33* 31 34*  GLUCOSE 121* 142* 111*  BUN 96* 97* 94*  CREATININE 3.75* 3.76* 3.36*   CALCIUM 7.8* 7.7* 8.0*   GFR: Estimated Creatinine Clearance: 13.8 mL/min (A) (by C-G formula based on SCr of 3.36 mg/dL (H)).   Liver Function Tests: Recent Labs  Lab 07/17/19 0940  AST 12*  ALT 10  ALKPHOS 61  BILITOT 1.2  PROT 6.7  ALBUMIN 2.3*   Recent Labs  Lab 07/17/19 0940  LIPASE 33   CBG: Recent Labs  Lab 07/17/19 2054 07/18/19 0748 07/18/19 1205  GLUCAP 118* 106* 115*   Urine analysis:    Component Value Date/Time   COLORURINE YELLOW 07/17/2019 0940   APPEARANCEUR TURBID (A) 07/17/2019 0940   LABSPEC 1.011 07/17/2019 0940   PHURINE 7.0 07/17/2019 0940   GLUCOSEU NEGATIVE 07/17/2019 0940   HGBUR SMALL (A) 07/17/2019 0940   BILIRUBINUR NEGATIVE 07/17/2019 0940   KETONESUR 5 (A) 07/17/2019 0940   PROTEINUR 100 (A) 07/17/2019 0940   UROBILINOGEN 0.2 01/29/2007 1500   NITRITE NEGATIVE 07/17/2019 0940   LEUKOCYTESUR LARGE (A) 07/17/2019 0940    Recent Results (from the past 240 hour(s))  SARS CORONAVIRUS 2 (TAT 6-24 HRS)     Status: None   Collection Time: 07/11/19  9:32 PM  Result Value Ref Range Status   SARS Coronavirus 2 NEGATIVE NEGATIVE Final    Comment: (NOTE) SARS-CoV-2 target nucleic acids are NOT DETECTED. The SARS-CoV-2 RNA is generally detectable in upper and lower respiratory specimens during the acute phase of infection. Negative results do not preclude SARS-CoV-2 infection, do not rule out co-infections with other pathogens, and should not be used as the sole basis for treatment or other patient management decisions. Negative results must be combined with clinical observations, patient  history, and epidemiological information. The expected result is Negative. Fact Sheet for Patients: SugarRoll.be Fact Sheet for Healthcare Providers: https://www.woods-mathews.com/ This test is not yet approved or cleared by the [place] FDA and  has been authorized for detection and/or diagnosis of  SARS-CoV-2 by FDA under an Emergency Use Authorization (EUA). This EUA will remain  in effect (meaning this test can be used) for the duration of the COVID-19 declaration under Section 56 4(b)(1) of the Act, 21 U.S.C. section 360bbb-3(b)(1), unless the authorization is terminated or revoked sooner. Performed at [facility], [place] [address], [place], [place] [address]   SARS CORONAVIRUS 2 (TAT 6-24 HRS) Nasopharyngeal Nasopharyngeal Swab     Status: None   Collection Time: 07/17/19  2:31 PM   Specimen: Nasopharyngeal Swab  Result Value Ref Range Status   SARS Coronavirus 2 NEGATIVE NEGATIVE Final    Comment: (NOTE) SARS-CoV-2 target nucleic acids are NOT DETECTED. The SARS-CoV-2 RNA is generally detectable in upper and lower respiratory specimens during the acute phase of infection. Negative results do not preclude SARS-CoV-2 infection, do not rule out co-infections with other pathogens, and should not be used as the sole basis for treatment or other patient management decisions. Negative results must be combined with clinical observations, patient history, and epidemiological information. The expected result is Negative. Fact Sheet for Patients: SugarRoll.be Fact Sheet for Healthcare Providers: https://www.woods-mathews.com/ This test is not yet approved or cleared by the [place] FDA and  has been authorized for detection and/or diagnosis of SARS-CoV-2 by FDA under an Emergency Use Authorization (EUA). This EUA will remain  in effect (meaning this test can be used) for the duration of the COVID-19 declaration under Section 56 4(b)(1) of the Act, 21 U.S.C. section 360bbb-3(b)(1), unless the authorization is terminated or revoked sooner. Performed at [facility], [place] [address] [place], [place] [address]   MRSA PCR Screening     Status: None   Collection Time: 07/17/19 10:00 PM   Specimen: Nasal Mucosa; Nasopharyngeal   Result Value Ref Range Status   MRSA by PCR NEGATIVE NEGATIVE Final    Comment:        The GeneXpert MRSA Assay (FDA approved for NASAL specimens only), is one component of a comprehensive MRSA colonization surveillance program. It is not intended to diagnose MRSA infection nor to guide or monitor treatment for MRSA infections. Performed at [facility], [address], [place], [place] [address]      Radiology Studies: CT ABDOMEN PELVIS WO CONTRAST  Result Date: 07/17/2019 CLINICAL DATA:  Abdominal pain, nausea and vomiting EXAM: CT ABDOMEN AND PELVIS WITHOUT CONTRAST TECHNIQUE: Multidetector CT imaging of the abdomen and pelvis was performed following the standard protocol without IV contrast. COMPARISON:  None. FINDINGS: Lower chest: Increased bibasilar bandlike opacities consistent with atelectasis. Right middle lobe indeterminate small nodules noted, series 4, images 3 and 7. These measure 5 mm. Stable cardiomegaly. No pericardial or pleural effusion. Large hiatal hernia again noted with the majority of the stomach in the chest. Hepatobiliary: Limited without IV contrast. Remote cholecystectomy. No intrahepatic biliary dilatation or large focal hepatic abnormality. Pancreas: Unremarkable. No pancreatic ductal dilatation or surrounding inflammatory changes. Spleen: Normal in size without focal abnormality. Adrenals/Urinary Tract: Normal adrenal glands. Kidneys demonstrate mild chronic perinephric strandy edema. Renal obstruction or hydronephrosis. No hydroureter or obstructing ureteral calculus adjacent chronic calcified gonadal vein phleboliths noted on the left. Urinary bladder unremarkable. Stomach/Bowel: Large hiatal hernia again noted. Majority of stomach in the  chest. Negative for obstruction, significant dilatation, ileus, or free air. Extensive colonic diverticulosis without acute focal inflammatory process or wall thickening. No fluid collection, hemorrhage, hematoma, abscess or  ascites. Vascular/Lymphatic: Abdominal atherosclerosis noted. Negative for aneurysm. No retroperitoneal hemorrhage or hematoma. No bulky adenopathy. Reproductive: Status post hysterectomy. No adnexal masses. Other: No abdominal wall hernia or abnormality. No abdominopelvic ascites. Musculoskeletal: Remote lumbar spinal fusion. No acute osseous finding. Improved body anasarca compared to the prior study. IMPRESSION: Stable large hiatal hernia with majority of the stomach in the chest. Increased bibasilar atelectasis Right middle lobe 5 mm pulmonary nodules remain indeterminate. No follow-up needed if patient is low-risk. Non-contrast chest CT can be considered in 12 months if patient is high-risk. This recommendation follows the consensus statement: Guidelines for Management of Incidental Pulmonary Nodules Detected on CT Images: From the Fleischner Society 2017; Radiology 2017; 284:228-243. Diverticulosis without acute inflammatory process Improved body anasarca Electronically Signed   By: Jerilynn Mages.  Shick M.D.   On: 07/17/2019 13:06   DG Chest Portable 1 View  Result Date: 07/17/2019 CLINICAL DATA:  Vomiting, abdominal pain. EXAM: PORTABLE CHEST 1 VIEW COMPARISON:  July 05, 2019. FINDINGS: Stable cardiomediastinal silhouette. Right internal jugular Port-A-Cath is unchanged. No pneumothorax or pleural effusion is noted. No acute pulmonary disease is noted. Bony thorax is unremarkable. Stable hiatal hernia. IMPRESSION: No acute cardiopulmonary abnormality seen.  Stable hiatal hernia. Electronically Signed   By: Marijo Conception M.D.   On: 07/17/2019 10:53   DG Abd Portable 1 View  Result Date: 07/17/2019 CLINICAL DATA:  Weakness, vomiting. EXAM: PORTABLE ABDOMEN - 1 VIEW COMPARISON:  September 03, 2010. FINDINGS: The bowel gas pattern is normal. No radio-opaque calculi or other significant radiographic abnormality are seen. IMPRESSION: Negative. Electronically Signed   By: Marijo Conception M.D.   On: 07/17/2019  10:53    Scheduled Meds: . allopurinol  100 mg Oral BID  . atorvastatin  40 mg Oral QHS  . diltiazem  120 mg Oral Daily  . famotidine  20 mg Oral QHS  . umeclidinium bromide  1 puff Inhalation Daily   And  . fluticasone furoate-vilanterol  1 puff Inhalation Daily  . hydrALAZINE  25 mg Oral BID  . insulin aspart  0-9 Units Subcutaneous TID WC  . insulin glargine  5 Units Subcutaneous QHS  . metoprolol succinate  75 mg Oral Daily  . pantoprazole (PROTONIX) IV  40 mg Intravenous Q12H  . sertraline  50 mg Oral Daily  . sodium chloride flush  3 mL Intravenous Q12H  . tamsulosin  0.4 mg Oral Daily   Continuous Infusions: . sodium chloride       LOS: 0 days    Time spent: 30 minutes.   Barton Dubois, MD [facility] Pager [phone]   07/18/2019, 12:24 PM

## 2019-07-18 NOTE — Consult Note (Signed)
Referring Provider: No ref. provider found Primary Care Physician:  [name], MD Primary Gastroenterologist:  Dr.  Oneida Alar  Reason for Consultation: Coffee-ground emesis.  HPI: 73 year old lady with multiple medical problems including atrial fibrillation on Eliquis, COPD, diastolic heart failure, chronic kidney disease, gout admitted with a 1 week history of nausea and some coffee ground emesis (over the past 48 hours).  No gross hematemesis, melena or hematochezia.  Vague epigastric pain.  Chronic esophageal dysphagia. Hemodynamically stable in the ED. Slight dip in hemoglobin to 7.8 from the mid 8 range (recent baseline).  Hemoglobin 8.1 this morning. CT of the abdomen demonstrates large hiatal hernia with most of the stomach in the chest. History of a peptic esophageal stricture, large hiatal hernia with Lysbeth Galas ulcers.  Esophagus dilated previously. Colonoscopy 2017-pancolonic diverticulosis and tubular adenomas removed. History of incomplete capsule study the small bowel.  Please note in addition to Eliquis patient admits to me she takes Aleve on a regular basis for back pain up until 1 week ago (stopped with the onset of her nausea)  Past Medical History:  Diagnosis Date  . Blood transfusion without reported diagnosis   . CAD (coronary artery disease)    Nonobstructive at cardiac catheterization 2000  . Cataract   . Cervical cancer ([place]) 1978  . CHF (congestive heart failure) ([place])   . Chronic back pain   . Chronic kidney disease   . COPD (chronic obstructive pulmonary disease) ([place])   . Degenerative disc disease   . Essential hypertension, benign   . GERD (gastroesophageal reflux disease)   . Gout   . Hiatal hernia 07/27/2013  . History of diverticulitis of colon   . History of hiatal hernia   . Iron deficiency anemia   . Irritable bowel syndrome   . Lumbar radiculopathy   . Mixed hyperlipidemia   . Neuropathy   . Osteoporosis   . Ovarian cancer [facility])  1978   patient denies. States this was cervical cancer  . Oxygen deficiency   . Sleep apnea   . Type 2 diabetes mellitus ([place])   . Vitamin B deficiency 12/25/2009  . Vitamin B12 deficiency     Past Surgical History:  Procedure Laterality Date  . ABDOMINAL HYSTERECTOMY    . BACK SURGERY    . Benign breast cysts    . CHOLECYSTECTOMY    . COLONOSCOPY  10/01/2006   SLF:Pan colonic diverticulosis and moderate internal hemorrhoids/ Otherwise no polyps, masses, inflammatory changes or AVMs/  . COLONOSCOPY  2011   SLF: pancolonic diverticulosis, large internal hemorrhoids  . COLONOSCOPY N/A 01/26/2016   Procedure: COLONOSCOPY;  Surgeon: [name], MD;  Location: [facility];  Service: Endoscopy;  Laterality: N/A;  830   . ESOPHAGOGASTRODUODENOSCOPY  11/19/2006   SLF: Large hiatal hernia without evidence of Cameron ulcers/. Distal esophageal stricture, which allowed the gastroscope to pass without resistance.  A 16 mm Savary later passed with mild resistance/ Normal stomach.sb bx negative  . ESOPHAGOGASTRODUODENOSCOPY  10/01/2006   DXI:PJASN hiatal hernia.  Distal esophagus without evidence of   erythema, ulceration or Barrett's esophagus  . ESOPHAGOGASTRODUODENOSCOPY  2011   SLF: large hh, distal esophageal web narrowing to 49mm s/p dilation to 37mm  . ESOPHAGOGASTRODUODENOSCOPY N/A 08/06/2013   SLF: 1. Stricture at the gastroesophageal junction 2. large hiatal hernia. 3. Mild erosive gastritis.  Marland Kitchen GIVENS CAPSULE STUDY N/A 08/06/2013   INCOMPLETE-SMALL BOWLE ULCERS  . KNEE SURGERY Right   . PARTIAL HYSTERECTOMY  1978  .  small bowel capsule  2008   negative  . SPINE SURGERY    . TONSILLECTOMY AND ADENOIDECTOMY    . Two back surgeries/fusion    . UMBILICAL HERNIA REPAIR  2010    Prior to Admission medications   Medication Sig Start Date End Date Taking? Authorizing Provider  albuterol (PROVENTIL) (2.5 MG/3ML) 0.083% nebulizer solution Take 2.5 mg by nebulization every 6 (six)  hours as needed for wheezing or shortness of breath.  04/09/19   [provider]  albuterol (VENTOLIN HFA) 108 (90 Base) MCG/ACT inhaler Inhale 2 puffs into the lungs every 6 (six) hours as needed for wheezing or shortness of breath.  04/09/19   [provider]  allopurinol (ZYLOPRIM) 100 MG tablet Take 100 mg by mouth 2 (two) times daily.  10/09/12   [provider]  apixaban (ELIQUIS) 5 MG TABS tablet Take 1 tablet (5 mg total) by mouth 2 (two) times daily. 06/21/19   Johnson, Clanford L, MD  aspirin EC 81 MG tablet Take 81 mg by mouth daily.    [provider]  atorvastatin (LIPITOR) 40 MG tablet Take 1 tablet (40 mg total) by mouth at bedtime. 05/04/18   Satira Sark, MD  Biotin 5 MG CAPS Take 5 mg by mouth daily.    [provider]  Cholecalciferol 1.25 MG (50000 UT) capsule Take 50,000 Units by mouth. Once a day on Thursday 07/15/19 07/22/19  [provider]  diltiazem (CARDIZEM CD) 120 MG 24 hr capsule Take 1 capsule (120 mg total) by mouth daily. 06/22/19   Johnson, Clanford L, MD  esomeprazole (NEXIUM) 40 MG capsule TAKE 1 CAPSULE TWICE A DAY 30 MINUTES PRIOR TO MEALS 01/19/18   Annitta Needs, NP  famotidine (PEPCID) 40 MG tablet TAKE 1 TABLET AT BEDTIME AS NEEDED TO CONTROL REFLUX 06/23/15   Mannam, Praveen, MD  feeding supplement, GLUCERNA SHAKE, (GLUCERNA SHAKE) LIQD Take 237 mLs by mouth 2 (two) times daily between meals.    [provider]  hydrALAZINE (APRESOLINE) 25 MG tablet Take 25 mg by mouth 2 (two) times daily.    [provider]  LANTUS SOLOSTAR 100 UNIT/ML SOPN Inject 8 Units into the skin at bedtime.  10/06/12   [provider]  loperamide (IMODIUM) 2 MG capsule Take 2 mg by mouth every 6 (six) hours as needed for diarrhea or loose stools.     [provider]  metoprolol succinate (TOPROL-XL) 25 MG 24 hr tablet Take 25 mg by mouth daily. Along with 50 mg to = 75 mg    [provider]    metoprolol succinate (TOPROL-XL) 50 MG 24 hr tablet Take 50 mg by mouth daily. Along with 25 mg to = 75 mg    [provider]  NON FORMULARY Diet: Regular, NAS Consistent Carbohydrate    [provider]  NONFORMULARY OR COMPOUNDED ITEM May use CPaP from home at previous home settings while asleep. Every Shift Day, Evening, Night    [provider]  OXYGEN Inhale 2 L into the lungs. Every Shift    [provider]  potassium chloride SA (KLOR-CON) 10 MEQ tablet Take 1 tablet (10 mEq total) by mouth every other day. 07/08/19   Johnson, Clanford L, MD  sertraline (ZOLOFT) 50 MG tablet Take 50 mg by mouth daily.    [provider]  tamsulosin (FLOMAX) 0.4 MG CAPS capsule Take 1 capsule (0.4 mg total) by mouth daily. 07/08/19   Murlean Iba, MD  torsemide (DEMADEX) 10 MG tablet Take 5 tablets (50 mg total) by mouth daily. 07/09/19   Johnson, Clanford L, MD  TRELEGY ELLIPTA 100-62.5-25 MCG/INH AEPB Inhale 1 puff into the lungs daily. 05/13/19   [provider]  vitamin C (ASCORBIC ACID) 500 MG tablet Take 500 mg by mouth daily. 07/15/19 07/28/19  [provider]  zinc sulfate 220 (50 Zn) MG capsule Take 220 mg by mouth daily. 07/15/19 07/28/19  [provider]    Current Facility-Administered Medications  Medication Dose Route Frequency Provider Last Rate Last Admin  . 0.9 %  sodium chloride infusion  250 mL Intravenous PRN Barton Dubois, MD      . acetaminophen (TYLENOL) tablet 650 mg  650 mg Oral Q6H PRN Barton Dubois, MD   650 mg at 07/17/19 2134   Or  . acetaminophen (TYLENOL) suppository 650 mg  650 mg Rectal Q6H PRN Barton Dubois, MD      . albuterol (PROVENTIL) (2.5 MG/3ML) 0.083% nebulizer solution 2.5 mg  2.5 mg Nebulization Q6H PRN Barton Dubois, MD      . allopurinol (ZYLOPRIM) tablet 100 mg  100 mg Oral BID Barton Dubois, MD   100 mg at 07/17/19 2134  . atorvastatin (LIPITOR) tablet 40 mg  40 mg Oral QHS  Barton Dubois, MD   40 mg at 07/17/19 2133  . diltiazem (CARDIZEM CD) 24 hr capsule 120 mg  120 mg Oral Daily Barton Dubois, MD      . famotidine (PEPCID) tablet 20 mg  20 mg Oral QHS Barton Dubois, MD   20 mg at 07/17/19 2133  . umeclidinium bromide (INCRUSE ELLIPTA) 62.5 MCG/INH 1 puff  1 puff Inhalation Daily Barton Dubois, MD       And  . fluticasone furoate-vilanterol (BREO ELLIPTA) 100-25 MCG/INH 1 puff  1 puff Inhalation Daily Barton Dubois, MD      . hydrALAZINE (APRESOLINE) tablet 25 mg  25 mg Oral BID Barton Dubois, MD   25 mg at 07/17/19 2133  . insulin aspart (novoLOG) injection 0-9 Units  0-9 Units Subcutaneous TID WC Barton Dubois, MD      . insulin glargine (LANTUS) injection 5 Units  5 Units Subcutaneous QHS Barton Dubois, MD   5 Units at 07/17/19 2132  . metoprolol succinate (TOPROL-XL) 24 hr tablet 75 mg  75 mg Oral Daily Barton Dubois, MD      . pantoprazole (PROTONIX) injection 40 mg  40 mg Intravenous Wynelle Bourgeois, MD   40 mg at 07/17/19 2133  . sertraline (ZOLOFT) tablet 50 mg  50 mg Oral Daily Barton Dubois, MD      . sodium chloride flush (NS) 0.9 % injection 3 mL  3 mL Intravenous Q12H Barton Dubois, MD   3 mL at 07/17/19 2134  . sodium chloride flush (NS) 0.9 % injection 3 mL  3 mL Intravenous PRN Barton Dubois, MD      . tamsulosin (FLOMAX) capsule 0.4 mg  0.4 mg Oral Daily Barton Dubois, MD        Allergies as of 07/17/2019  . (No Known Allergies)    Family History  Problem Relation Age of Onset  . Colon cancer Brother        diagnosed age 21. Living.   Marland Kitchen Ulcers Sister   . Diabetes Sister   . Heart attack Sister   . Kidney failure Sister   . Stroke Sister   . Ulcers Mother   . Diabetes Mother   . Heart attack  Mother   . Stroke Mother   . Asthma Mother   . Heart disease Mother   . Cervical cancer Mother   . Heart attack Brother   . Heart disease Brother   . Asthma Sister   . Diabetes Brother   . Stroke Maternal Grandmother   .  Heart attack Maternal Grandmother   . Heart attack Other   . Early death Father        MVA in his 71s    Social History   Socioeconomic History  . Marital status: Widowed    Spouse name: Adami  . Number of children: 4  . Years of education: Not on file  . Highest education level: 10th grade  Occupational History  . Occupation: retired    Comment: Ambulance person, [employer] work  Tobacco Use  . Smoking status: Former Smoker    Packs/day: 1.00    Years: 1.00    Pack years: 1.00    Types: Cigarettes    Start date: 02/19/1961    Quit date: 08/05/1961    Years since quitting: 57.9  . Smokeless tobacco: Never Used  . Tobacco comment: Quit smoking x 50 years  Substance and Sexual Activity  . Alcohol use: No  . Drug use: No  . Sexual activity: Not Currently  Other Topics Concern  . Not on file  Social History Narrative   HAS 4 SON-GRAND [place].   Lives in home with Laverdure - married 13 Y   Cook, sew, quilt, crochet   Social Determinants of Health   Financial Resource Strain:   . Difficulty of Paying Living Expenses: Not on file  Food Insecurity:   . Worried About Charity fundraiser in the Last Year: Not on file  . Ran Out of Food in the Last Year: Not on file  Transportation Needs:   . Lack of Transportation (Medical): Not on file  . Lack of Transportation (Non-Medical): Not on file  Physical Activity:   . Days of Exercise per Week: Not on file  . Minutes of Exercise per Session: Not on file  Stress:   . Feeling of Stress : Not on file  Social Connections:   . Frequency of Communication with Friends and Family: Not on file  . Frequency of Social Gatherings with Friends and Family: Not on file  . Attends Religious Services: Not on file  . Active Member of Clubs or Organizations: Not on file  . Attends Archivist Meetings: Not on file  . Marital Status: Not on file  Intimate Partner Violence:   . Fear of Current or Ex-Partner: Not on file  .  Emotionally Abused: Not on file  . Physically Abused: Not on file  . Sexually Abused: Not on file    Review of Systems: As in history of present illness   Physical Exam: Vital signs in last 24 hours: Temp:  [98.3 F (36.8 C)-98.6 F (37 C)] 98.6 F (37 C) (12/13 0606) Pulse Rate:  [101-111] 106 (12/13 0606) Resp:  [13-23] 17 (12/13 0606) BP: (106-167)/(36-80) 133/80 (12/13 0606) SpO2:  [92 %-99 %] 99 % (12/13 0606) Weight:  [83.1 kg-83.9 kg] 83.9 kg (12/13 0500) Last BM Date: 07/17/19 General:   Alert,  pleasant and cooperative in NAD Neck:  Supple; no masses or thyromegaly. Lungs:  Clear throughout to auscultation.   No wheezes, crackles, or rhonchi. No acute distress. Heart:  Regular rate and rhythm; no murmurs, clicks, rubs,  or gallops.  Abdomen:  S obese.  Positive bowel sounds soft very minimal epigastric tenderness.  No obvious mass organomegaly Intake/Output from previous day: 12/12 0701 - 12/13 0700 In: 740.7 [P.O.:240; IV Piggyback:500.7] Out: 200 [Urine:200] Intake/Output this shift: No intake/output data recorded.  Lab Results: Recent Labs    07/17/19 0940 07/18/19 0803  WBC 11.1* 7.9  HGB 7.8* 8.1*  HCT 24.8* 26.5*  PLT 317 285   BMET Recent Labs    07/16/19 0430 07/17/19 0940 07/18/19 0803  NA 140 139 143  K 4.4 4.4 4.2  CL 92* 92* 99  CO2 33* 31 34*  GLUCOSE 121* 142* 111*  BUN 96* 97* 94*  CREATININE 3.75* 3.76* 3.36*  CALCIUM 7.8* 7.7* 8.0*   LFT Recent Labs    07/17/19 0940  PROT 6.7  ALBUMIN 2.3*  AST 12*  ALT 10  ALKPHOS 61  BILITOT 1.2   PT/INR No results for input(s): LABPROT, INR in the last 72 hours. Hepatitis Panel No results for input(s): HEPBSAG, HCVAB, HEPAIGM, HEPBIGM in the last 72 hours. C-Diff No results for input(s): CDIFFTOX in the last 72 hours.  Studies/Results: CT ABDOMEN PELVIS WO CONTRAST  Result Date: 07/17/2019 CLINICAL DATA:  Abdominal pain, nausea and vomiting EXAM: CT ABDOMEN AND PELVIS WITHOUT  CONTRAST TECHNIQUE: Multidetector CT imaging of the abdomen and pelvis was performed following the standard protocol without IV contrast. COMPARISON:  None. FINDINGS: Lower chest: Increased bibasilar bandlike opacities consistent with atelectasis. Right middle lobe indeterminate small nodules noted, series 4, images 3 and 7. These measure 5 mm. Stable cardiomegaly. No pericardial or pleural effusion. Large hiatal hernia again noted with the majority of the stomach in the chest. Hepatobiliary: Limited without IV contrast. Remote cholecystectomy. No intrahepatic biliary dilatation or large focal hepatic abnormality. Pancreas: Unremarkable. No pancreatic ductal dilatation or surrounding inflammatory changes. Spleen: Normal in size without focal abnormality. Adrenals/Urinary Tract: Normal adrenal glands. Kidneys demonstrate mild chronic perinephric strandy edema. Renal obstruction or hydronephrosis. No hydroureter or obstructing ureteral calculus adjacent chronic calcified gonadal vein phleboliths noted on the left. Urinary bladder unremarkable. Stomach/Bowel: Large hiatal hernia again noted. Majority of stomach in the chest. Negative for obstruction, significant dilatation, ileus, or free air. Extensive colonic diverticulosis without acute focal inflammatory process or wall thickening. No fluid collection, hemorrhage, hematoma, abscess or ascites. Vascular/Lymphatic: Abdominal atherosclerosis noted. Negative for aneurysm. No retroperitoneal hemorrhage or hematoma. No bulky adenopathy. Reproductive: Status post hysterectomy. No adnexal masses. Other: No abdominal wall hernia or abnormality. No abdominopelvic ascites. Musculoskeletal: Remote lumbar spinal fusion. No acute osseous finding. Improved body anasarca compared to the prior study. IMPRESSION: Stable large hiatal hernia with majority of the stomach in the chest. Increased bibasilar atelectasis Right middle lobe 5 mm pulmonary nodules remain indeterminate. No  follow-up needed if patient is low-risk. Non-contrast chest CT can be considered in 12 months if patient is high-risk. This recommendation follows the consensus statement: Guidelines for Management of Incidental Pulmonary Nodules Detected on CT Images: From the Fleischner Society 2017; Radiology 2017; 284:228-243. Diverticulosis without acute inflammatory process Improved body anasarca Electronically Signed   By: Jerilynn Mages.  Shick M.D.   On: 07/17/2019 13:06   DG Chest Portable 1 View  Result Date: 07/17/2019 CLINICAL DATA:  Vomiting, abdominal pain. EXAM: PORTABLE CHEST 1 VIEW COMPARISON:  July 05, 2019. FINDINGS: Stable cardiomediastinal silhouette. Right internal jugular Port-A-Cath is unchanged. No pneumothorax or pleural effusion is noted. No acute pulmonary disease is noted. Bony thorax is unremarkable. Stable hiatal hernia. IMPRESSION: No acute cardiopulmonary abnormality  seen.  Stable hiatal hernia. Electronically Signed   By: Marijo Conception M.D.   On: 07/17/2019 10:53   DG Abd Portable 1 View  Result Date: 07/17/2019 CLINICAL DATA:  Weakness, vomiting. EXAM: PORTABLE ABDOMEN - 1 VIEW COMPARISON:  September 03, 2010. FINDINGS: The bowel gas pattern is normal. No radio-opaque calculi or other significant radiographic abnormality are seen. IMPRESSION: Negative. Electronically Signed   By: Marijo Conception M.D.   On: 07/17/2019 10:53    Impression: Pleasant 73 year old lady with numerous medical problems including atrial fibrillation for which has been chronically anticoagulated admitted to the hospital 1 week history of nausea and recent coffee-ground emesis.  Minimal drop in hemoglobin from baseline.  History of complicated GERD with stricture, chronic esophageal dysphagia and Cameron ulcers. I suspect she has an NSAID gastropathy (Aleve) with upper GI bleed precipitated by chronic anticoagulation therapy.   Recommendations: Agree with limiting diet to sips of clear liquids today.  Agree with twice  daily PPI therapy IV. Eliquis held. I have offered the patient a diagnostic EGD with esophageal dilation as feasible/appropriate per plan tomorrow.  We will utilize propofol. The risks, benefits, limitations, alternatives and imponderables have been reviewed with the patient. Potential for esophageal dilation, biopsy, etc. have also been reviewed.  Questions have been answered. All parties agreeable. Further recommendations to follow.

## 2019-07-18 NOTE — H&P (View-Only) (Signed)
Referring Provider: No ref. provider found Primary Care Physician:  [name], MD Primary Gastroenterologist:  Dr.  Oneida Alar  Reason for Consultation: Coffee-ground emesis.  HPI: 73 year old lady with multiple medical problems including atrial fibrillation on Eliquis, COPD, diastolic heart failure, chronic kidney disease, gout admitted with a 1 week history of nausea and some coffee ground emesis (over the past 48 hours).  No gross hematemesis, melena or hematochezia.  Vague epigastric pain.  Chronic esophageal dysphagia. Hemodynamically stable in the ED. Slight dip in hemoglobin to 7.8 from the mid 8 range (recent baseline).  Hemoglobin 8.1 this morning. CT of the abdomen demonstrates large hiatal hernia with most of the stomach in the chest. History of a peptic esophageal stricture, large hiatal hernia with Lysbeth Galas ulcers.  Esophagus dilated previously. Colonoscopy 2017-pancolonic diverticulosis and tubular adenomas removed. History of incomplete capsule study the small bowel.  Please note in addition to Eliquis patient admits to me she takes Aleve on a regular basis for back pain up until 1 week ago (stopped with the onset of her nausea)  Past Medical History:  Diagnosis Date   Blood transfusion without reported diagnosis    CAD (coronary artery disease)    Nonobstructive at cardiac catheterization 2000   Cataract    Cervical cancer ([place]) 1978   CHF (congestive heart failure) (HCC)    Chronic back pain    Chronic kidney disease    COPD (chronic obstructive pulmonary disease) ([place])    Degenerative disc disease    Essential hypertension, benign    GERD (gastroesophageal reflux disease)    Gout    Hiatal hernia 07/27/2013   History of diverticulitis of colon    History of hiatal hernia    Iron deficiency anemia    Irritable bowel syndrome    Lumbar radiculopathy    Mixed hyperlipidemia    Neuropathy    Osteoporosis    Ovarian cancer ([place])  1978   patient denies. States this was cervical cancer   Oxygen deficiency    Sleep apnea    Type 2 diabetes mellitus ([place])    Vitamin B deficiency 12/25/2009   Vitamin B12 deficiency     Past Surgical History:  Procedure Laterality Date   ABDOMINAL HYSTERECTOMY     BACK SURGERY     Benign breast cysts     CHOLECYSTECTOMY     COLONOSCOPY  10/01/2006   SLF:Pan colonic diverticulosis and moderate internal hemorrhoids/ Otherwise no polyps, masses, inflammatory changes or AVMs/   COLONOSCOPY  2011   SLF: pancolonic diverticulosis, large internal hemorrhoids   COLONOSCOPY N/A 01/26/2016   Procedure: COLONOSCOPY;  Surgeon: [name], MD;  Location: [facility];  Service: Endoscopy;  Laterality: N/A;  830    ESOPHAGOGASTRODUODENOSCOPY  11/19/2006   SLF: Large hiatal hernia without evidence of Cameron ulcers/. Distal esophageal stricture, which allowed the gastroscope to pass without resistance.  A 16 mm Savary later passed with mild resistance/ Normal stomach.sb bx negative   ESOPHAGOGASTRODUODENOSCOPY  10/01/2006   OEV:[id number] hiatal hernia.  Distal esophagus without evidence of   erythema, ulceration or Barrett's esophagus   ESOPHAGOGASTRODUODENOSCOPY  2011   SLF: large hh, distal esophageal web narrowing to 50mm s/p dilation to 62mm   ESOPHAGOGASTRODUODENOSCOPY N/A 08/06/2013   SLF: 1. Stricture at the gastroesophageal junction 2. large hiatal hernia. 3. Mild erosive gastritis.   GIVENS CAPSULE STUDY N/A 08/06/2013   INCOMPLETE-SMALL BOWLE ULCERS   KNEE SURGERY Right    PARTIAL HYSTERECTOMY  1978  small bowel capsule  2008   negative   SPINE SURGERY     TONSILLECTOMY AND ADENOIDECTOMY     Two back surgeries/fusion     UMBILICAL HERNIA REPAIR  2010    Prior to Admission medications   Medication Sig Start Date End Date Taking? Authorizing Provider  albuterol (PROVENTIL) (2.5 MG/3ML) 0.083% nebulizer solution Take 2.5 mg by nebulization every 6 (six)  hours as needed for wheezing or shortness of breath.  04/09/19   [provider]  albuterol (VENTOLIN HFA) 108 (90 Base) MCG/ACT inhaler Inhale 2 puffs into the lungs every 6 (six) hours as needed for wheezing or shortness of breath.  04/09/19   [provider]  allopurinol (ZYLOPRIM) 100 MG tablet Take 100 mg by mouth 2 (two) times daily.  10/09/12   [provider]  apixaban (ELIQUIS) 5 MG TABS tablet Take 1 tablet (5 mg total) by mouth 2 (two) times daily. 06/21/19   Johnson, Clanford L, MD  aspirin EC 81 MG tablet Take 81 mg by mouth daily.    [provider]  atorvastatin (LIPITOR) 40 MG tablet Take 1 tablet (40 mg total) by mouth at bedtime. 05/04/18   Satira Sark, MD  Biotin 5 MG CAPS Take 5 mg by mouth daily.    [provider]  Cholecalciferol 1.25 MG (50000 UT) capsule Take 50,000 Units by mouth. Once a day on Thursday 07/15/19 07/22/19  [provider]  diltiazem (CARDIZEM CD) 120 MG 24 hr capsule Take 1 capsule (120 mg total) by mouth daily. 06/22/19   Johnson, Clanford L, MD  esomeprazole (NEXIUM) 40 MG capsule TAKE 1 CAPSULE TWICE A DAY 30 MINUTES PRIOR TO MEALS 01/19/18   Annitta Needs, NP  famotidine (PEPCID) 40 MG tablet TAKE 1 TABLET AT BEDTIME AS NEEDED TO CONTROL REFLUX 06/23/15   Mannam, Praveen, MD  feeding supplement, GLUCERNA SHAKE, (GLUCERNA SHAKE) LIQD Take 237 mLs by mouth 2 (two) times daily between meals.    [provider]  hydrALAZINE (APRESOLINE) 25 MG tablet Take 25 mg by mouth 2 (two) times daily.    [provider]  LANTUS SOLOSTAR 100 UNIT/ML SOPN Inject 8 Units into the skin at bedtime.  10/06/12   [provider]  loperamide (IMODIUM) 2 MG capsule Take 2 mg by mouth every 6 (six) hours as needed for diarrhea or loose stools.     [provider]  metoprolol succinate (TOPROL-XL) 25 MG 24 hr tablet Take 25 mg by mouth daily. Along with 50 mg to = 75 mg    [provider]    metoprolol succinate (TOPROL-XL) 50 MG 24 hr tablet Take 50 mg by mouth daily. Along with 25 mg to = 75 mg    [provider]  NON FORMULARY Diet: Regular, NAS Consistent Carbohydrate    [provider]  NONFORMULARY OR COMPOUNDED ITEM May use CPaP from home at previous home settings while asleep. Every Shift Day, Evening, Night    [provider]  OXYGEN Inhale 2 L into the lungs. Every Shift    [provider]  potassium chloride SA (KLOR-CON) 10 MEQ tablet Take 1 tablet (10 mEq total) by mouth every other day. 07/08/19   Johnson, Clanford L, MD  sertraline (ZOLOFT) 50 MG tablet Take 50 mg by mouth daily.    [provider]  tamsulosin (FLOMAX) 0.4 MG CAPS capsule Take 1 capsule (0.4 mg total) by mouth daily. 07/08/19   Murlean Iba, MD  torsemide (DEMADEX) 10 MG tablet Take 5 tablets (50 mg total) by mouth daily. 07/09/19   Johnson, Clanford L, MD  TRELEGY ELLIPTA 100-62.5-25 MCG/INH AEPB Inhale 1 puff into the lungs daily. 05/13/19   [provider]  vitamin C (ASCORBIC ACID) 500 MG tablet Take 500 mg by mouth daily. 07/15/19 07/28/19  [provider]  zinc sulfate 220 (50 Zn) MG capsule Take 220 mg by mouth daily. 07/15/19 07/28/19  [provider]    Current Facility-Administered Medications  Medication Dose Route Frequency Provider Last Rate Last Admin   0.9 %  sodium chloride infusion  250 mL Intravenous PRN Barton Dubois, MD       acetaminophen (TYLENOL) tablet 650 mg  650 mg Oral Q6H PRN Barton Dubois, MD   650 mg at 07/17/19 2134   Or   acetaminophen (TYLENOL) suppository 650 mg  650 mg Rectal Q6H PRN Barton Dubois, MD       albuterol (PROVENTIL) (2.5 MG/3ML) 0.083% nebulizer solution 2.5 mg  2.5 mg Nebulization Q6H PRN Barton Dubois, MD       allopurinol (ZYLOPRIM) tablet 100 mg  100 mg Oral BID Barton Dubois, MD   100 mg at 07/17/19 2134   atorvastatin (LIPITOR) tablet 40 mg  40 mg Oral QHS  Barton Dubois, MD   40 mg at 07/17/19 2133   diltiazem (CARDIZEM CD) 24 hr capsule 120 mg  120 mg Oral Daily Barton Dubois, MD       famotidine (PEPCID) tablet 20 mg  20 mg Oral QHS Barton Dubois, MD   20 mg at 07/17/19 2133   umeclidinium bromide (INCRUSE ELLIPTA) 62.5 MCG/INH 1 puff  1 puff Inhalation Daily Barton Dubois, MD       And   fluticasone furoate-vilanterol (BREO ELLIPTA) 100-25 MCG/INH 1 puff  1 puff Inhalation Daily Barton Dubois, MD       hydrALAZINE (APRESOLINE) tablet 25 mg  25 mg Oral BID Barton Dubois, MD   25 mg at 07/17/19 2133   insulin aspart (novoLOG) injection 0-9 Units  0-9 Units Subcutaneous TID WC Barton Dubois, MD       insulin glargine (LANTUS) injection 5 Units  5 Units Subcutaneous QHS Barton Dubois, MD   5 Units at 07/17/19 2132   metoprolol succinate (TOPROL-XL) 24 hr tablet 75 mg  75 mg Oral Daily Barton Dubois, MD       pantoprazole (PROTONIX) injection 40 mg  40 mg Intravenous Wynelle Bourgeois, MD   40 mg at 07/17/19 2133   sertraline (ZOLOFT) tablet 50 mg  50 mg Oral Daily Barton Dubois, MD       sodium chloride flush (NS) 0.9 % injection 3 mL  3 mL Intravenous Q12H Barton Dubois, MD   3 mL at 07/17/19 2134   sodium chloride flush (NS) 0.9 % injection 3 mL  3 mL Intravenous PRN Barton Dubois, MD       tamsulosin [facility]) capsule 0.4 mg  0.4 mg Oral Daily Barton Dubois, MD        Allergies as of 07/17/2019   (No Known Allergies)    Family History  Problem Relation Age of Onset   Colon cancer Brother        diagnosed age 49. Living.    Ulcers Sister    Diabetes Sister    Heart attack Sister    Kidney failure Sister    Stroke Sister    Ulcers Mother    Diabetes Mother    Heart attack  Mother    Stroke Mother    Asthma Mother    Heart disease Mother    Cervical cancer Mother    Heart attack Brother    Heart disease Brother    Asthma Sister    Diabetes Brother    Stroke Maternal Grandmother     Heart attack Maternal Grandmother    Heart attack Other    Early death Father        MVA in his 38s    Social History   Socioeconomic History   Marital status: Widowed    Spouse name: Neyens   Number of children: 4   Years of education: Not on file   Highest education level: 10th grade  Occupational History   Occupation: retired    Comment: Ambulance person, [place]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   Lives in home with [place] - married 13 Y   Cook, sew, quilt, crochet   Social Determinants of Health   Financial Resource Strain:    Difficulty of Paying Living Expenses: Not on file  Food Insecurity:    Worried About Charity fundraiser in the Last Year: Not on file   [employer] of Food in the Last Year: Not on file  Transportation Needs:    Film/video editor (Medical): Not on file   Lack of Transportation (Non-Medical): Not on file  Physical Activity:    Days of Exercise per Week: Not on file   Minutes of Exercise per Session: Not on file  Stress:    Feeling of Stress : Not on file  Social Connections:    Frequency of Communication with Friends and Family: Not on file   Frequency of Social Gatherings with Friends and Family: Not on file   Attends Religious Services: Not on file   Active Member of [place] or Organizations: Not on file   Attends Archivist Meetings: Not on file   Marital Status: Not on file  Intimate Partner Violence:    Fear of Current or Ex-Partner: Not on file    Emotionally Abused: Not on file   Physically Abused: Not on file   Sexually Abused: Not on file    Review of Systems: As in history of present illness   Physical Exam: Vital signs in last 24 hours: Temp:  [98.3 F (36.8 C)-98.6 F (37 C)] 98.6 F (37 C) (12/13 0606) Pulse Rate:  [101-111] 106 (12/13 0606) Resp:  [13-23] 17 (12/13 0606) BP: (106-167)/(36-80) 133/80 (12/13 0606) SpO2:  [92 %-99 %] 99 % (12/13 0606) Weight:  [83.1 kg-83.9 kg] 83.9 kg (12/13 0500) Last BM Date: 07/17/19 General:   Alert,  pleasant and cooperative in NAD Neck:  Supple; no masses or thyromegaly. Lungs:  Clear throughout to auscultation.   No wheezes, crackles, or rhonchi. No acute distress. Heart:  Regular rate and rhythm; no murmurs, clicks, rubs,  or gallops.  Abdomen:  S obese.  Positive bowel sounds soft very minimal epigastric tenderness.  No obvious mass organomegaly Intake/Output from previous day: 12/12 0701 - 12/13 0700 In: 740.7 [P.O.:240; IV Piggyback:500.7] Out: 200 [Urine:200] Intake/Output this shift: No intake/output data recorded.  Lab Results: Recent Labs    07/17/19 0940 07/18/19 0803  WBC 11.1* 7.9  HGB 7.8* 8.1*  HCT 24.8* 26.5*  PLT 317 285   BMET Recent Labs    07/16/19 0430 07/17/19 0940 07/18/19 0803  NA 140 139 143  K 4.4 4.4 4.2  CL 92* 92* 99  CO2 33* 31 34*  GLUCOSE 121* 142* 111*  BUN 96* 97* 94*  CREATININE 3.75* 3.76* 3.36*  CALCIUM 7.8* 7.7* 8.0*   LFT Recent Labs    07/17/19 0940  PROT 6.7  ALBUMIN 2.3*  AST 12*  ALT 10  ALKPHOS 61  BILITOT 1.2   PT/INR No results for input(s): LABPROT, INR in the last 72 hours. Hepatitis Panel No results for input(s): HEPBSAG, HCVAB, HEPAIGM, HEPBIGM in the last 72 hours. C-Diff No results for input(s): CDIFFTOX in the last 72 hours.  Studies/Results: CT ABDOMEN PELVIS WO CONTRAST  Result Date: 07/17/2019 CLINICAL DATA:  Abdominal pain, nausea and vomiting EXAM: CT ABDOMEN AND PELVIS WITHOUT  CONTRAST TECHNIQUE: Multidetector CT imaging of the abdomen and pelvis was performed following the standard protocol without IV contrast. COMPARISON:  None. FINDINGS: Lower chest: Increased bibasilar bandlike opacities consistent with atelectasis. Right middle lobe indeterminate small nodules noted, series 4, images 3 and 7. These measure 5 mm. Stable cardiomegaly. No pericardial or pleural effusion. Large hiatal hernia again noted with the majority of the stomach in the chest. Hepatobiliary: Limited without IV contrast. Remote cholecystectomy. No intrahepatic biliary dilatation or large focal hepatic abnormality. Pancreas: Unremarkable. No pancreatic ductal dilatation or surrounding inflammatory changes. Spleen: Normal in size without focal abnormality. Adrenals/Urinary Tract: Normal adrenal glands. Kidneys demonstrate mild chronic perinephric strandy edema. Renal obstruction or hydronephrosis. No hydroureter or obstructing ureteral calculus adjacent chronic calcified gonadal vein phleboliths noted on the left. Urinary bladder unremarkable. Stomach/Bowel: Large hiatal hernia again noted. Majority of stomach in the chest. Negative for obstruction, significant dilatation, ileus, or free air. Extensive colonic diverticulosis without acute focal inflammatory process or wall thickening. No fluid collection, hemorrhage, hematoma, abscess or ascites. Vascular/Lymphatic: Abdominal atherosclerosis noted. Negative for aneurysm. No retroperitoneal hemorrhage or hematoma. No bulky adenopathy. Reproductive: Status post hysterectomy. No adnexal masses. Other: No abdominal wall hernia or abnormality. No abdominopelvic ascites. Musculoskeletal: Remote lumbar spinal fusion. No acute osseous finding. Improved body anasarca compared to the prior study. IMPRESSION: Stable large hiatal hernia with majority of the stomach in the chest. Increased bibasilar atelectasis Right middle lobe 5 mm pulmonary nodules remain indeterminate. No  follow-up needed if patient is low-risk. Non-contrast chest CT can be considered in 12 months if patient is high-risk. This recommendation follows the consensus statement: Guidelines for Management of Incidental Pulmonary Nodules Detected on CT Images: From the Fleischner Society 2017; Radiology 2017; 284:228-243. Diverticulosis without acute inflammatory process Improved body anasarca Electronically Signed   By: Jerilynn Mages.  Shick M.D.   On: 07/17/2019 13:06   DG Chest Portable 1 View  Result Date: 07/17/2019 CLINICAL DATA:  Vomiting, abdominal pain. EXAM: PORTABLE CHEST 1 VIEW COMPARISON:  July 05, 2019. FINDINGS: Stable cardiomediastinal silhouette. Right internal jugular Port-A-Cath is unchanged. No pneumothorax or pleural effusion is noted. No acute pulmonary disease is noted. Bony thorax is unremarkable. Stable hiatal hernia. IMPRESSION: No acute cardiopulmonary abnormality  seen.  Stable hiatal hernia. Electronically Signed   By: Marijo Conception M.D.   On: 07/17/2019 10:53   DG Abd Portable 1 View  Result Date: 07/17/2019 CLINICAL DATA:  Weakness, vomiting. EXAM: PORTABLE ABDOMEN - 1 VIEW COMPARISON:  September 03, 2010. FINDINGS: The bowel gas pattern is normal. No radio-opaque calculi or other significant radiographic abnormality are seen. IMPRESSION: Negative. Electronically Signed   By: Marijo Conception M.D.   On: 07/17/2019 10:53    Impression: Pleasant 73 year old lady with numerous medical problems including atrial fibrillation for which has been chronically anticoagulated admitted to the hospital 1 week history of nausea and recent coffee-ground emesis.  Minimal drop in hemoglobin from baseline.  History of complicated GERD with stricture, chronic esophageal dysphagia and Cameron ulcers. I suspect she has an NSAID gastropathy (Aleve) with upper GI bleed precipitated by chronic anticoagulation therapy.   Recommendations: Agree with limiting diet to sips of clear liquids today.  Agree with twice  daily PPI therapy IV. Eliquis held. I have offered the patient a diagnostic EGD with esophageal dilation as feasible/appropriate per plan tomorrow.  We will utilize propofol. The risks, benefits, limitations, alternatives and imponderables have been reviewed with the patient. Potential for esophageal dilation, biopsy, etc. have also been reviewed.  Questions have been answered. All parties agreeable. Further recommendations to follow.

## 2019-07-19 ENCOUNTER — Encounter (HOSPITAL_COMMUNITY): Payer: Self-pay | Admitting: Internal Medicine

## 2019-07-19 ENCOUNTER — Encounter (HOSPITAL_COMMUNITY)
Admission: EM | Disposition: A | Discharge status: Skilled Nursing Facility | Payer: Self-pay | Source: Skilled Nursing Facility | Attending: Family Medicine

## 2019-07-19 ENCOUNTER — Inpatient Hospital Stay (HOSPITAL_COMMUNITY): Payer: Medicare Other | Admitting: Anesthesiology

## 2019-07-19 DIAGNOSIS — K209 Esophagitis, unspecified without bleeding: Secondary | ICD-10-CM

## 2019-07-19 DIAGNOSIS — R1310 Dysphagia, unspecified: Secondary | ICD-10-CM

## 2019-07-19 DIAGNOSIS — K222 Esophageal obstruction: Secondary | ICD-10-CM

## 2019-07-19 HISTORY — PX: ESOPHAGOGASTRODUODENOSCOPY (EGD) WITH PROPOFOL: SHX5813

## 2019-07-19 LAB — GLUCOSE, CAPILLARY
Glucose-Capillary: 98 mg/dL (ref 70–99)
Glucose-Capillary: 100 mg/dL — ABNORMAL HIGH (ref 70–99)
Glucose-Capillary: 93 mg/dL (ref 70–99)
Glucose-Capillary: 111 mg/dL — ABNORMAL HIGH (ref 70–99)

## 2019-07-19 SURGERY — ESOPHAGOGASTRODUODENOSCOPY (EGD) WITH PROPOFOL
Anesthesia: General

## 2019-07-19 MED ORDER — HYDROCODONE-ACETAMINOPHEN 7.5-325 MG PO TABS: 1.0000 | ORAL_TABLET | Freq: Once | ORAL | Status: DC | PRN

## 2019-07-19 MED ORDER — KETAMINE HCL 50 MG/5ML IJ SOSY
PREFILLED_SYRINGE | INTRAMUSCULAR | Status: AC
  Filled 2019-07-19: qty 5

## 2019-07-19 MED ORDER — PROPOFOL 500 MG/50ML IV EMUL
INTRAVENOUS | Status: DC | PRN
  Administered 2019-07-19: 150 ug/kg/min via INTRAVENOUS

## 2019-07-19 MED ORDER — SODIUM CHLORIDE 0.9 % IV SOLN: INTRAVENOUS | Status: DC

## 2019-07-19 MED ORDER — PROPOFOL 10 MG/ML IV BOLUS
INTRAVENOUS | Status: DC | PRN
  Administered 2019-07-19: 20 mg via INTRAVENOUS

## 2019-07-19 MED ORDER — PROPOFOL 10 MG/ML IV BOLUS
INTRAVENOUS | Status: AC
  Filled 2019-07-19: qty 20

## 2019-07-19 MED ORDER — HYDROMORPHONE HCL 1 MG/ML IJ SOLN: 0.2500 mg | INTRAMUSCULAR | Status: DC | PRN

## 2019-07-19 MED ORDER — PROMETHAZINE HCL 25 MG/ML IJ SOLN: 6.2500 mg | INTRAMUSCULAR | Status: DC | PRN

## 2019-07-19 MED ORDER — KETAMINE HCL 10 MG/ML IJ SOLN
INTRAMUSCULAR | Status: DC | PRN
  Administered 2019-07-19 (×2): 5 mg via INTRAVENOUS

## 2019-07-19 MED ORDER — MIDAZOLAM HCL 2 MG/2ML IJ SOLN: 0.5000 mg | Freq: Once | INTRAMUSCULAR | Status: DC | PRN

## 2019-07-19 MED ORDER — LACTATED RINGERS IV SOLN
INTRAVENOUS | Status: DC
  Administered 2019-07-19: 16:00:00 via INTRAVENOUS

## 2019-07-19 NOTE — Interval H&P Note (Signed)
History and Physical Interval Note:  07/19/2019 4:27 PM  [name]  has presented today for surgery, with the diagnosis of UGI bleed  /.  The various methods of treatment have been discussed with the patient and family. After consideration of risks, benefits and other options for treatment, the patient has consented to  Procedure(s): ESOPHAGOGASTRODUODENOSCOPY (EGD) WITH PROPOFOL (N/A) as a surgical intervention.  The patient's history has been reviewed, patient examined, no change in status, stable for surgery.  I have reviewed the patient's chart and labs.  Questions were answered to the patient's satisfaction.     Estanislado Surgeon  No change.  For EGD with possible esophageal dilation as feasible/appropriate today per plan.  The risks, benefits, limitations, alternatives and imponderables have been reviewed with the patient. Potential for esophageal dilation, biopsy, etc. have also been reviewed.  Questions have been answered. All parties agreeable.

## 2019-07-19 NOTE — Transfer of Care (Signed)
Immediate Anesthesia Transfer of Care Note  Patient: MELEA PREZIOSO  Procedure(s) Performed: ESOPHAGOGASTRODUODENOSCOPY (EGD) WITH POSSIBLE ESOPHAGEAL DILATION WITH PROPOFOL (N/A )  Patient Location: PACU  Anesthesia Type:General  Level of Consciousness: awake  Airway & Oxygen Therapy: Patient Spontanous Breathing  Post-op Assessment: Report given to RN  Post vital signs: Reviewed  Last Vitals:  Vitals Value Taken Time  BP 132/97 07/19/19 1703  Temp    Pulse 111 07/19/19 1707  Resp 13 07/19/19 1707  SpO2 99 % 07/19/19 1707  Vitals shown include unvalidated device data.  Last Pain:  Vitals:   07/19/19 1625  TempSrc:   PainSc: 0-No pain      Patients Stated Pain Goal: 8 (16/10/96 0454)  Complications: No apparent anesthesia complications

## 2019-07-19 NOTE — NC FL2 (Signed)
[place] LEVEL OF CARE SCREENING TOOL     IDENTIFICATION  Patient Name: [name] Birthdate: 1945-09-09 Sex: female Admission Date (Current Location): 07/17/2019  [facility] and [place] Number:  [employer] and Address:  [facility] [address] [place]      Provider Number: [id number]  Attending Physician Name and Address:  Deatra James, MD  Relative Name and Phone Number:       Current Level of Care: Hospital Recommended Level of Care: [place] Prior Approval Number:    Date Approved/Denied:   PASRR Number: [id number]  Discharge Plan: SNF    Current Diagnoses: Patient Active Problem List   Diagnosis Date Noted  . GI bleed 07/18/2019  . Acute UTI 07/18/2019  . Coffee ground emesis 07/17/2019  . Anorexia 07/10/2019  . Urinary retention, Female 07/07/2019  . Elevated troponin 07/07/2019  . AKI (acute kidney injury) ([place]) 07/06/2019  . Hypoxia 07/05/2019  . Weight gain with edema 07/02/2019  . 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 CKD stage 3 due to type 2 diabetes mellitus ([place]) 06/22/2019  . Neuropathy due to type 2 diabetes mellitus ([place]) 06/22/2019  . Dyslipidemia associated with type 2 diabetes mellitus ([place]) 06/22/2019  . Chronic gout 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 06/22/2019  . Quality of life palliative care encounter 06/19/2019  . Atrial fibrillation with RVR ([place])   . Atrial flutter with rapid ventricular response ([place]) 06/16/2019  . Dyspnea and respiratory abnormalities 06/01/2019  . Acute on chronic respiratory failure with hypoxia and hypercapnia with respiratory acidosis 06/01/2019  . Anasarca 05/31/2019  . Nausea without vomiting 05/31/2019  . Generalized abdominal pain 05/31/2019  . History of colonic polyps 05/31/2019  . Lymphedema of both lower  extremities 04/28/2019  . Chronic respiratory failure with hypoxia ([place]) 07/13/2018  . Acute renal failure superimposed on stage 3 chronic kidney disease ([place]) 07/13/2018  . Chronic diastolic CHF (congestive heart failure) ([place]) 07/13/2018  . COPD (chronic obstructive pulmonary disease) ([place]) 07/13/2018  . COPD exacerbation ([place]) 07/12/2018  . Flatulence 09/18/2017  . IBS (irritable bowel syndrome) 04/03/2017  . Gout 03/15/2017  . Acute on chronic diastolic CHF (congestive heart failure) ([place]) 05/08/2016  . CKD (chronic kidney disease) stage 3, GFR 30-59 ml/min 05/08/2016  . COPD with acute exacerbation ([place]) 05/08/2016  . Constipation 04/25/2016  . Peripheral vertigo 06/27/2015  . Port-A-Cath in place 10/06/2014  . Chronic obstructive pulmonary disease ([place]) 04/30/2014  . Obesity, Class III, BMI 40-49.9 (morbid obesity) ([place]) 04/30/2014  . Hemorrhoids, internal, with bleeding 11/10/2013  . Anemia 08/18/2013  . Hiatal hernia with GERD and esophagitis   . Obstructive chronic bronchitis without exacerbation ([place]) 12/26/2009  . Dysphagia 12/26/2009  . DM type 2 with diabetic peripheral neuropathy ([place]) 12/25/2009  . Vitamin B deficiency 12/25/2009  . Iron deficiency anemia due to chronic blood loss 12/25/2009  . Benign essential HTN 12/25/2009  . DEGENERATIVE DISC DISEASE 12/25/2009  . OSA on CPAP 12/25/2009  . HLD (hyperlipidemia) 12/25/2009  . Type 2 diabetes with nephropathy ([place]) 12/25/2009    Orientation RESPIRATION BLADDER Height & Weight     Self, Time, Situation  O2 Continent Weight: 83.9 kg Height:  4\' 11"  (149.9 cm)  BEHAVIORAL SYMPTOMS/MOOD NEUROLOGICAL BOWEL NUTRITION STATUS      Continent Diet(see DC summary)  AMBULATORY STATUS COMMUNICATION OF NEEDS Skin   Extensive  Assist Verbally Normal                       Personal Care Assistance Level of Assistance  Bathing, Feeding, Dressing Bathing Assistance: Limited assistance Feeding assistance:  Independent Dressing Assistance: Limited assistance     Functional Limitations Info  Sight, Hearing, Speech Sight Info: Adequate Hearing Info: Adequate Speech Info: Adequate    SPECIAL CARE FACTORS FREQUENCY  PT (By licensed PT)     PT Frequency: 5x/weel              Contractures Contractures Info: Not present    Additional Factors Info  Code Status, Allergies, Psychotropic Code Status Info: full Allergies Info: nka Psychotropic Info: elavil         Current Medications (07/19/2019):  This is the current hospital active medication list Current Facility-Administered Medications  Medication Dose Route Frequency Provider Last Rate Last Admin  . 0.9 %  sodium chloride infusion  250 mL Intravenous PRN Barton Dubois, MD      . acetaminophen (TYLENOL) tablet 650 mg  650 mg Oral Q6H PRN Barton Dubois, MD   650 mg at 07/17/19 2134   Or  . acetaminophen (TYLENOL) suppository 650 mg  650 mg Rectal Q6H PRN Barton Dubois, MD      . albuterol (PROVENTIL) (2.5 MG/3ML) 0.083% nebulizer solution 2.5 mg  2.5 mg Nebulization Q6H PRN Barton Dubois, MD      . allopurinol (ZYLOPRIM) tablet 100 mg  100 mg Oral BID Barton Dubois, MD   100 mg at 07/18/19 2207  . atorvastatin (LIPITOR) tablet 40 mg  40 mg Oral QHS Barton Dubois, MD   40 mg at 07/18/19 2207  . Chlorhexidine Gluconate Cloth 2 % PADS 6 each  6 each Topical Daily Barton Dubois, MD   6 each at 07/18/19 2215  . diltiazem (CARDIZEM CD) 24 hr capsule 120 mg  120 mg Oral Daily Barton Dubois, MD   120 mg at 07/18/19 0947  . famotidine (PEPCID) tablet 20 mg  20 mg Oral QHS Barton Dubois, MD   20 mg at 07/18/19 2207  . umeclidinium bromide (INCRUSE ELLIPTA) 62.5 MCG/INH 1 puff  1 puff Inhalation Daily Barton Dubois, MD   1 puff at 07/19/19 0830   And  . fluticasone furoate-vilanterol (BREO ELLIPTA) 100-25 MCG/INH 1 puff  1 puff Inhalation Daily Barton Dubois, MD   1 puff at 07/19/19 0830  . hydrALAZINE (APRESOLINE) tablet 25 mg   25 mg Oral BID Barton Dubois, MD   25 mg at 07/18/19 2208  . insulin aspart (novoLOG) injection 0-9 Units  0-9 Units Subcutaneous TID WC Barton Dubois, MD      . insulin glargine (LANTUS) injection 5 Units  5 Units Subcutaneous QHS Barton Dubois, MD   Stopped at 07/18/19 2210  . metoprolol succinate (TOPROL-XL) 24 hr tablet 75 mg  75 mg Oral Daily Barton Dubois, MD   75 mg at 07/18/19 0947  . pantoprazole (PROTONIX) injection 40 mg  40 mg Intravenous Q12H Barton Dubois, MD   40 mg at 07/18/19 2207  . sertraline (ZOLOFT) tablet 50 mg  50 mg Oral Daily Barton Dubois, MD   50 mg at 07/18/19 0947  . sodium chloride flush (NS) 0.9 % injection 3 mL  3 mL Intravenous Q12H Barton Dubois, MD   3 mL at 07/19/19 0930  . sodium chloride flush (NS) 0.9 % injection 3 mL  3 mL Intravenous PRN Barton Dubois, MD      .  tamsulosin (FLOMAX) capsule 0.4 mg  0.4 mg Oral Daily Barton Dubois, MD   0.4 mg at 07/18/19 6503     Discharge Medications: Please see discharge summary for a list of discharge medications.  Relevant Imaging Results:  Relevant Lab Results:   Additional Information SSN [id number] [place]  Zebulun Deman, [name], RN

## 2019-07-19 NOTE — Anesthesia Preprocedure Evaluation (Signed)
Anesthesia Evaluation  Patient identified by MRN, date of birth, ID band Patient awake    Reviewed: Allergy & Precautions, NPO status , Patient's Chart, lab work & pertinent test results, reviewed documented beta blocker date and time   History of Anesthesia Complications (+) PROLONGED EMERGENCE and history of anesthetic complications  Airway Mallampati: II  TM Distance: >3 FB Neck ROM: Full    Dental no notable dental hx. (+) Teeth Intact   Pulmonary sleep apnea, Continuous Positive Airway Pressure Ventilation and Oxygen sleep apnea , COPD,  COPD inhaler and oxygen dependent, former smoker,    Pulmonary exam normal breath sounds clear to auscultation       Cardiovascular Exercise Tolerance: Poor hypertension, Pt. on home beta blockers and Pt. on medications + CAD and +CHF  Normal cardiovascular examII Rhythm:Regular Rate:Normal     Neuro/Psych  Neuromuscular disease negative psych ROS   GI/Hepatic Neg liver ROS, hiatal hernia, GERD  Medicated and Controlled,  Endo/Other  negative endocrine ROSdiabetes  Renal/GU Renal InsufficiencyRenal disease  negative genitourinary   Musculoskeletal  (+) Arthritis , Osteoarthritis,    Abdominal   Peds negative pediatric ROS (+)  Hematology negative hematology ROS (+) anemia ,   Anesthesia Other Findings   Reproductive/Obstetrics negative OB ROS                             Anesthesia Physical Anesthesia Plan  ASA: IV  Anesthesia Plan: General   Post-op Pain Management:    Induction: Intravenous  PONV Risk Score and Plan: 3 and TIVA, Propofol infusion, Treatment may vary due to age or medical condition and Ondansetron  Airway Management Planned: Simple Face Mask and Nasal Cannula  Additional Equipment:   Intra-op Plan:   Post-operative Plan:   Informed Consent: I have reviewed the patients History and Physical, chart, labs and discussed  the procedure including the risks, benefits and alternatives for the proposed anesthesia with the patient or authorized representative who has indicated his/her understanding and acceptance.     Dental advisory given  Plan Discussed with: CRNA  Anesthesia Plan Comments: (Plan Full PPE use  Plan GA with GETA as needed d/w pt -WTP with same after Q&A  D/w pt unlikely need for post procedure ventilation -voiced understanding WTP as above)        Anesthesia Quick Evaluation

## 2019-07-19 NOTE — Progress Notes (Signed)
Patient refused CPAP early in shift about 2200, this note reflects this

## 2019-07-19 NOTE — Progress Notes (Signed)
Patient refusing CPAP. No unit in room at this time. 

## 2019-07-19 NOTE — TOC Initial Note (Signed)
Transition of Care [facility]) - Initial/Assessment Note    Patient Details  Name: [name] MRN: [id number] Date of Birth: Dec 11, 1945  [facility] of [facility] CM/SW Contact:    Sierah Lacewell, [name], RN Phone Number: 07/19/2019, 1:21 PM  Clinical Narrative:  Coffee ground emesis. EGD today, potential DC tomorrow.  Patient from [facility]. Discussed with Tami of [place], patient ok to return, will need new FL2 and covid results to be no more than 72 hours old.                  Expected Discharge Plan: Skilled Nursing Facility Barriers to Discharge: Continued Medical Work up      Expected Discharge Plan and Services Expected Discharge Plan: [place]            Activities of Daily Living Home Assistive Devices/Equipment: Gilford Rile (specify type) ADL Screening (condition at time of admission) Patient's cognitive ability adequate to safely complete daily activities?: Yes Is the patient deaf or have difficulty hearing?: No Does the patient have difficulty seeing, even when wearing glasses/contacts?: No Does the patient have difficulty concentrating, remembering, or making decisions?: No Patient able to express need for assistance with ADLs?: Yes Does the patient have difficulty dressing or bathing?: No Independently performs ADLs?: Yes (appropriate for developmental age) Does the patient have difficulty walking or climbing stairs?: Yes Weakness of Legs: Both Weakness of Arms/Hands: None       Admission diagnosis:  Coffee ground emesis [K92.0] GI bleed [K92.2] Patient Active Problem List   Diagnosis Date Noted  . GI bleed 07/18/2019  . Acute UTI 07/18/2019  . Coffee ground emesis 07/17/2019  . Anorexia 07/10/2019  . Urinary retention, Female 07/07/2019  . Elevated troponin 07/07/2019  . AKI (acute kidney injury) ([place]) 07/06/2019  . Hypoxia 07/05/2019  . Weight gain with edema 07/02/2019  . Hypertensive heart and kidney disease with chronic diastolic  congestive heart failure and stage 3b chronic kidney disease ([place]) 06/22/2019  . CKD stage 3 due to type 2 diabetes mellitus ([place]) 06/22/2019  . Neuropathy due to type 2 diabetes mellitus ([place]) 06/22/2019  . Dyslipidemia associated with type 2 diabetes mellitus ([place]) 06/22/2019  . Chronic gout 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 06/22/2019  . Quality of life palliative care encounter 06/19/2019  . Atrial fibrillation with RVR ([place])   . Atrial flutter with rapid ventricular response ([place]) 06/16/2019  . Dyspnea and respiratory abnormalities 06/01/2019  . Acute on chronic respiratory failure with hypoxia and hypercapnia with respiratory acidosis 06/01/2019  . Anasarca 05/31/2019  . Nausea without vomiting 05/31/2019  . Generalized abdominal pain 05/31/2019  . History of colonic polyps 05/31/2019  . Lymphedema of both lower extremities 04/28/2019  . Chronic respiratory failure with hypoxia ([place]) 07/13/2018  . Acute renal failure superimposed on stage 3 chronic kidney disease ([place]) 07/13/2018  . Chronic diastolic CHF (congestive heart failure) ([place]) 07/13/2018  . COPD (chronic obstructive pulmonary disease) ([place]) 07/13/2018  . COPD exacerbation ([place]) 07/12/2018  . Flatulence 09/18/2017  . IBS (irritable bowel syndrome) 04/03/2017  . Gout 03/15/2017  . Acute on chronic diastolic CHF (congestive heart failure) ([place]) 05/08/2016  . CKD (chronic kidney disease) stage 3, GFR 30-59 ml/min 05/08/2016  . COPD with acute exacerbation ([place]) 05/08/2016  . Constipation 04/25/2016  . Peripheral vertigo 06/27/2015  . Port-A-Cath in place 10/06/2014  . Chronic obstructive pulmonary disease ([place]) 04/30/2014  . Obesity, Class III, BMI 40-49.9 (morbid obesity) ([place]) 04/30/2014  . Hemorrhoids, internal, with bleeding  11/10/2013  . Anemia 08/18/2013  . Hiatal hernia with GERD and esophagitis   . Obstructive chronic bronchitis without exacerbation ([place]) 12/26/2009  . Dysphagia 12/26/2009  . DM type 2  with diabetic peripheral neuropathy ([place]) 12/25/2009  . Vitamin B deficiency 12/25/2009  . Iron deficiency anemia due to chronic blood loss 12/25/2009  . Benign essential HTN 12/25/2009  . DEGENERATIVE DISC DISEASE 12/25/2009  . OSA on CPAP 12/25/2009  . HLD (hyperlipidemia) 12/25/2009  . Type 2 diabetes with nephropathy ([place]) 12/25/2009   PCP:  [name], MD Pharmacy:   [facility] - [address], [place] [address] [place] [address] Phone: [phone] Fax: [phone]  [facility] [phone] - [place], [place] AT [place] [address] [place] [address] Phone: [phone] Fax: [phone]     Social Determinants of Health (SDOH) Interventions    Readmission Risk Interventions Readmission Risk Prevention Plan 07/19/2019 07/08/2019 07/07/2019  Transportation Screening Complete Complete Complete  PCP or Specialist Appt within 3-5 Days - - -  Not Complete comments - - -  [place]RI or [place] or Home Care Consult comments - - -  Social Work Consult for [place] Planning/Counseling - - -  Palliative Care Screening - - -  Medication Review (RN Care Manager) Complete Complete Complete  PCP or Specialist appointment within 3-5 days of discharge Complete Not Complete -  PCP/Specialist Appt Not Complete comments - Patient will be seen by medical director at [place] or Home Care Consult Complete Complete -  SW Recovery Care/Counseling Consult Complete Complete Complete  Palliative Care Screening Not Applicable Not Applicable Not Applicable  Skilled Nursing Facility Complete Complete Complete  Some recent data might be hidden

## 2019-07-19 NOTE — Anesthesia Postprocedure Evaluation (Signed)
Anesthesia Post Note  Patient: [name]  Procedure(s) Performed: ESOPHAGOGASTRODUODENOSCOPY (EGD) WITH POSSIBLE ESOPHAGEAL DILATION WITH PROPOFOL (N/A )  Patient location during evaluation: PACU Anesthesia Type: General Level of consciousness: awake and alert and oriented Pain management: pain level controlled Vital Signs Assessment: post-procedure vital signs reviewed and stable Respiratory status: spontaneous breathing Cardiovascular status: blood pressure returned to baseline and stable Postop Assessment: no apparent nausea or vomiting Anesthetic complications: no     Last Vitals:  Vitals:   07/19/19 1503 07/19/19 1703  BP: (!) 150/58 (!) 132/97  Pulse:  (!) 112  Resp: 12 14  Temp: 36.9 C 36.9 C  SpO2: 100% 93%    Last Pain:  Vitals:   07/19/19 1625  TempSrc:   PainSc: 0-No pain                 Trevar Boehringer

## 2019-07-19 NOTE — Progress Notes (Signed)
Patient scheduled for EGD/dilation today. Eliquis on hold. Covid negative on admission. NPO. No overt GI bleeding. No further N/V. Aware of plans for procedure today. Risks and benefits discussed again with stated understanding by patient.

## 2019-07-19 NOTE — Progress Notes (Signed)
PROGRESS NOTE    [name]  HDQ:[id number] DOB: [birth year] DOA: 07/17/2019 PCP: [name], MD     Brief Narrative:  73 y.o. female with past medical history significant for diastolic heart failure, chronic kidney disease a stage IIIb; COPD, chronic respiratory failure with hypoxia, obesity, obstructive sleep apnea, atrial fibrillation (chronically on anticoagulation), gastroesophageal reflux disease/esophagitis, hiatal hernia, gout, type 2 diabetes with nephropathy and chronic anemia in the setting of renal failure; who presented to the hospital secondary to abdominal pain, nausea, vomiting and coffee-ground emesis.  Patient currently in nursing facility for rehabilitation purposes after recent admission due to acute on chronic heart failure exacerbation and UTI.  Apparently patient complains of sudden episode of nausea/vomiting with subsequent coffee-ground emesis x2 and nursing home.  Associated symptoms included abdominal pain, nonradiating, mid epigastric region, 5 out of 10 in intensity.  All her symptoms present on the day of admission.  Patient chronically on anticoagulation and compliant with medications prior to admission. She denies chest pain, headaches, blurred vision, focal weakness, dysuria, hematuria, melena, hematochezia, shortness of breath, worsening wheezing, palpitations, cough or any other complaints.  In the ED patient's hemoglobin was found to be slightly lower than baseline (at 7.8), renal function higher than from recent discharge at 3.76 and a BUN of 97.  No further episode of vomiting since she was in the emergency department.  Antiemetics have been provided.  TRH call to place patient in observation for further evaluation and management of coffee-ground emesis.  Urinalysis demonstrated pyuria but no other symptoms suggesting acute UTI.  Patient was  type and screen, gentle fluid resuscitation given and started on  PPI. =======================================================================  Subjective:  The patient was seen and examined this morning, remained stable, n.p.o. overnight in anticipation of EGD this a.m. Eliquis still on hold. Reporting no nausea vomiting or rectal bleed overnight.   --------------------------------------------------------------------------------------------------------------------------------  Assessment & Plan:  Abdominal pain/nausea/vomiting/coffee-ground emesis -N.p.o. overnight no further episodes of coffee-ground emesis -Improved abdominal pain nausea and vomiting  -With concern for upper GI bleed. -Mostly gastritis/esophagitis -Hemoglobin has remained stable no further hematemesis appreciated -Continue IV PPI. -GI consulted and planning EGD today (07/19/2019). -We will continue to hold aspirin and Eliquis for now   OSA on CPAP -Continue CPAP nightly -Not requiring any supplemental oxygen at this time  HLD (hyperlipidemia) -Continue statins.  Hiatal hernia with GERD and esophagitis -As mentioned above continue IV PPI -N.p.o. for now  Type 2 diabetes with nephropathy ([place]) -Continue sliding scale insulin adjusted dose of Lantus nightly -follow CBGs closely.  Obesity, Class III, BMI 40-49.9 (morbid obesity) (HCC) -Body mass index is 37.36 kg/m. -Low calorie diet and portion control discussed with patient.  COPD (chronic obstructive pulmonary disease) (HCC) -With chronic respiratory failure -No signs of acute exacerbation -Continue current oxygen supplementation -Continue home inhaler regimen.  Essential hypertension -Overall stable -Continue Toprol, Cardizem, hydralazine and Flomax.  Depression/anxiety -No suicidal ideation or hallucination -Continue Zoloft.  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 -Creatinine 3.76 >> 3.36 currently.  Pyuria -Patient continues denying dysuria -Continue supportive care monitor labs -no antibiotics at this time.  Gout -no acute flare -continue allopurinol  DVT prophylaxis: SCDs Code Status: Full code Family Communication: No family at bedside. Disposition Plan: Remains inpatient, continue to follow hemoglobin trend.  Endoscopy  anticipated on 07/19/2019.  Continue IV PPI  Consultants:   No GI service  Procedures:   Endoscopy planned for 07/19/2019  Antimicrobials:  Anti-infectives (From admission, onward)   None       Objective: Vitals:   07/18/19 2041 07/18/19 2138 07/19/19 0611 07/19/19 0833  BP:  (!) 135/54 (!) 128/57   Pulse: 92 94 94   Resp: 16 20 20    Temp:  98.4 F (36.9 C) 97.9 F (36.6 C)   TempSrc:  Oral Oral   SpO2: 98% 97% 95% 96%  Weight:      Height:        Intake/Output Summary (Last 24 hours) at 07/19/2019 1241 Last data filed at 07/18/2019 1700 Gross per 24 hour  Intake --  Output 700 ml  Net -700 ml    BP (!) 128/57 (BP Location: Left Arm)   Pulse 94   Temp 97.9 F (36.6 C) (Oral)   Resp 20   Ht 4\' 11"  (1.499 m)   Wt 83.9 kg   SpO2 96%   BMI 37.36 kg/m    Physical Exam  Constitution:  Alert, cooperative, no distress,  Psychiatric: Normal and stable mood and affect, cognition intact,   HEENT: Normocephalic, PERRL, otherwise with in Normal limits  Chest:Chest symmetric Cardio vascular:  S1/S2, RRR, No murmure, No Rubs or Gallops  pulmonary: Clear to auscultation bilaterally, respirations unlabored, negative wheezes / crackles Abdomen: Soft, non-tender, non-distended, bowel sounds,no masses, no organomegaly Muscular skeletal: Limited exam - in bed, able to move all 4 extremities, Normal strength,  Neuro: CNII-XII intact. , normal motor and sensation, reflexes  intact  Extremities: No pitting edema lower extremities, +2 pulses  Skin: Dry, warm to touch, negative for any Rashes, No open wounds Wounds: per nursing documentation Pressure Injury 07/17/19 Thigh Right Stage II -  Partial thickness loss of dermis presenting as a shallow open ulcer with a red, pink wound bed without slough. (Active)  07/17/19 2100  Location: Thigh  Location Orientation: Right  Staging: Stage II -  Partial thickness loss of dermis presenting as a shallow open ulcer with a red, pink wound bed without slough.  Wound Description (Comments):   Present on Admission:       Data Reviewed: I have personally reviewed following labs and imaging studies  CBC: Recent Labs  Lab 07/17/19 0940 07/18/19 0803  WBC 11.1* 7.9  NEUTROABS 9.6*  --   HGB 7.8* 8.1*  HCT 24.8* 26.5*  MCV 88.6 90.1  PLT 317 194   Basic Metabolic Panel: Recent Labs  Lab 07/16/19 0430 07/17/19 0940 07/18/19 0803  NA 140 139 143  K 4.4 4.4 4.2  CL 92* 92* 99  CO2 33* 31 34*  GLUCOSE 121* 142* 111*  BUN 96* 97* 94*  CREATININE 3.75* 3.76* 3.36*  CALCIUM 7.8* 7.7* 8.0*   GFR: Estimated Creatinine Clearance: 13.8 mL/min (A) (by C-G formula based on SCr of 3.36 mg/dL (H)).   Liver Function Tests: Recent Labs  Lab 07/17/19 0940  AST 12*  ALT 10  ALKPHOS 61  BILITOT 1.2  PROT 6.7  ALBUMIN 2.3*   Recent Labs  Lab 07/17/19 0940  LIPASE 33   CBG: Recent Labs  Lab 07/18/19 1643 07/18/19 2140 07/19/19 0337 07/19/19 0734 07/19/19 1137  GLUCAP 106* 89 98 100* 93   Urine analysis:    Component Value Date/Time   COLORURINE YELLOW 07/17/2019 0940   APPEARANCEUR TURBID (A) 07/17/2019 0940   LABSPEC 1.011 07/17/2019 0940  PHURINE 7.0 07/17/2019 0940   GLUCOSEU NEGATIVE 07/17/2019 0940   HGBUR SMALL (A) 07/17/2019 0940   BILIRUBINUR NEGATIVE 07/17/2019 0940   KETONESUR 5 (A) 07/17/2019 0940   PROTEINUR 100 (A) 07/17/2019 0940   UROBILINOGEN 0.2 01/29/2007 1500   NITRITE  NEGATIVE 07/17/2019 0940   LEUKOCYTESUR LARGE (A) 07/17/2019 0940    Recent Results (from the past 240 hour(s))  SARS CORONAVIRUS 2 (TAT 6-24 HRS)     Status: None   Collection Time: 07/11/19  9:32 PM  Result Value Ref Range Status   SARS Coronavirus 2 NEGATIVE NEGATIVE Final    Comment: (NOTE) SARS-CoV-2 target nucleic acids are NOT DETECTED. The SARS-CoV-2 RNA is generally detectable in upper and lower respiratory specimens during the acute phase of infection. Negative results do not preclude SARS-CoV-2 infection, do not rule out co-infections with other pathogens, and should not be used as the sole basis for treatment or other patient management decisions. Negative results must be combined with clinical observations, patient history, and epidemiological information. The expected result is Negative. Fact Sheet for Patients: SugarRoll.be Fact Sheet for Healthcare Providers: https://www.woods-mathews.com/ This test is not yet approved or cleared by the [place] FDA and  has been authorized for detection and/or diagnosis of SARS-CoV-2 by FDA under an Emergency Use Authorization (EUA). This EUA will remain  in effect (meaning this test can be used) for the duration of the COVID-19 declaration under Section 56 4(b)(1) of the Act, 21 U.S.C. section 360bbb-3(b)(1), unless the authorization is terminated or revoked sooner. Performed at [facility], [place] [address], [place], [place] [address]   Urine culture     Status: Abnormal (Preliminary result)   Collection Time: 07/17/19  9:40 AM   Specimen: Urine, Clean Catch  Result Value Ref Range Status   Specimen Description   Final    URINE, CLEAN CATCH Performed at [facility], [address], [place], [place] [address]    Special Requests   Final    NONE Performed at [facility], [address] [place], [place] [address]    Culture (A)  Final    >=100,000 COLONIES/mL ENTEROBACTER  AEROGENES IDENTIFICATION AND SUSCEPTIBILITIES TO FOLLOW Performed at [facility], [place] [address] [place], [place] [address]    Report Status PENDING  Incomplete  SARS CORONAVIRUS 2 (TAT 6-24 HRS) Nasopharyngeal Nasopharyngeal Swab     Status: None   Collection Time: 07/17/19  2:31 PM   Specimen: Nasopharyngeal Swab  Result Value Ref Range Status   SARS Coronavirus 2 NEGATIVE NEGATIVE Final    Comment: (NOTE) SARS-CoV-2 target nucleic acids are NOT DETECTED. The SARS-CoV-2 RNA is generally detectable in upper and lower respiratory specimens during the acute phase of infection. Negative results do not preclude SARS-CoV-2 infection, do not rule out co-infections with other pathogens, and should not be used as the sole basis for treatment or other patient management decisions. Negative results must be combined with clinical observations, patient history, and epidemiological information. The expected result is Negative. Fact Sheet for Patients: SugarRoll.be Fact Sheet for Healthcare Providers: https://www.woods-mathews.com/ This test is not yet approved or cleared by the [place] FDA and  has been authorized for detection and/or diagnosis of SARS-CoV-2 by FDA under an Emergency Use Authorization (EUA). This EUA will remain  in effect (meaning this test can be used) for the duration of the COVID-19 declaration under Section 56 4(b)(1) of the Act, 21 U.S.C. section 360bbb-3(b)(1), unless the authorization is terminated or revoked sooner.  Performed at [facility], [place] [address], [place], [place] [address]   MRSA PCR Screening     Status: None   Collection Time: 07/17/19 10:00 PM   Specimen: Nasal Mucosa; Nasopharyngeal  Result Value Ref Range Status   MRSA by PCR NEGATIVE NEGATIVE Final    Comment:        The GeneXpert MRSA Assay (FDA approved for NASAL specimens only), is one component of a comprehensive MRSA  colonization surveillance program. It is not intended to diagnose MRSA infection nor to guide or monitor treatment for MRSA infections. Performed at [facility], [address], [place], [place] [address]      Radiology Studies: No results found.  Scheduled Meds: . allopurinol  100 mg Oral BID  . atorvastatin  40 mg Oral QHS  . Chlorhexidine Gluconate Cloth  6 each Topical Daily  . diltiazem  120 mg Oral Daily  . famotidine  20 mg Oral QHS  . umeclidinium bromide  1 puff Inhalation Daily   And  . fluticasone furoate-vilanterol  1 puff Inhalation Daily  . hydrALAZINE  25 mg Oral BID  . insulin aspart  0-9 Units Subcutaneous TID WC  . insulin glargine  5 Units Subcutaneous QHS  . metoprolol succinate  75 mg Oral Daily  . pantoprazole (PROTONIX) IV  40 mg Intravenous Q12H  . sertraline  50 mg Oral Daily  . sodium chloride flush  3 mL Intravenous Q12H  . tamsulosin  0.4 mg Oral Daily   Continuous Infusions: . sodium chloride       LOS: 1 day    Time spent: 30 minutes.   Deatra James, MD [facility] Pager [phone]   07/19/2019, 12:41 PM

## 2019-07-19 NOTE — Op Note (Signed)
[facility] Patient Name: [name] Procedure Date: 07/19/2019 4:30 PM MRN: [id number] Date of Birth: 1946-07-30 Attending MD: Norvel Richards , MD CSN: [id number] Age: 73 Admit Type: Inpatient Procedure:                Upper GI endoscopy Indications:              Dysphagia, Coffee-ground emesis Providers:                Norvel Richards, MD, Charlsie Quest. Theda Sers RN, RN,                            Nelma Rothman, Technician Referring MD:             Nicole Kindred. Barrino Medicines:                Propofol per Anesthesia Complications:            No immediate complications. Estimated Blood Loss:     Estimated blood loss was minimal. Procedure:                Pre-Anesthesia Assessment:                           - Prior to the procedure, a History and Physical                            was performed, and patient medications and                            allergies were reviewed. The patient's tolerance of                            previous anesthesia was also reviewed. The risks                            and benefits of the procedure and the sedation                            options and risks were discussed with the patient.                            All questions were answered, and informed consent                            was obtained. Prior Anticoagulants: The patient                            last took Eliquis (apixaban) 2 days prior to the                            procedure. ASA Grade Assessment: III - A patient                            with severe systemic disease. After reviewing the  risks and benefits, the patient was deemed in                            satisfactory condition to undergo the procedure.                           After obtaining informed consent, the endoscope was                            passed under direct vision. Throughout the                            procedure, the patient's blood pressure, pulse, and                             oxygen saturations were monitored continuously. The                            [id number]-H190 ([id number]) scope was introduced through the                            mouth, and advanced to the second part of duodenum.                            The upper GI endoscopy was accomplished without                            difficulty. The patient tolerated the procedure                            well. Scope In: 4:43:19 PM Scope Out: 4:55:12 PM Total Procedure Duration: 0 hours 11 minutes 53 seconds  Findings:      Esophagitis was found. Circumferential erosions within 5 mm of the GE       junction. 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      A mild Schatzki ring was found at the gastroesophageal junction. Large       hiatal hernia. At least one half of the stomach above the diaphragm.       Amount of bile-stained mucus adherent to the gastric mucosa. Gastric       mucosa generally erythematous. No ulcer or infiltrating process seen.      The duodenal bulb and second portion of the duodenum were normal. The       scope was withdrawn. Dilation was performed with a Maloney dilator with       mild resistance at 43 Fr. The dilation site was examined and showed no       change. Impression:               -Mild erosive reflux esophagitis. - Mild Schatzki                            ring. -Dilated. Large hiatal hernia. Erythematous                            gastric mucosa.                           -  Normal duodenal bulb and second portion of the                            duodenum.                           - No specimens collected. Moderate Sedation:      Moderate (conscious) sedation was personally administered by an       anesthesia professional. The following parameters were monitored: oxygen       saturation, heart rate, blood pressure, respiratory rate, EKG, adequacy       of pulmonary ventilation, and response to care. Recommendation:           NO MORE ALEVE OR OTHER NASAIDS . Continue  twice                            daily PPI. Clear liquid diet remainder of the day.                            May advance tomorrow soft/chopped meats as                            tolerated. If felt to be clinically needed, would                            not resume Eliquis until 07/25/2019 Procedure Code(s):        --- Professional ---                           [phone], Esophagogastroduodenoscopy, flexible,                            transoral; diagnostic, including collection of                            specimen(s) by brushing or washing, when performed                            (separate procedure)                           43450, Dilation of esophagus, by unguided sound or                            bougie, single or multiple passes Diagnosis Code(s):        --- Professional ---                           K20.90, Esophagitis, unspecified without bleeding                           K22.2, Esophageal obstruction                           R13.10, Dysphagia, unspecified  K92.0, Hematemesis CPT copyright 2019 [employer]. All rights reserved. The codes documented in this report are preliminary and upon coder review may  be revised to meet current compliance requirements. Cristopher Estimable. Ayonna Speranza, MD Norvel Richards, MD 07/19/2019 5:09:54 PM This report has been signed electronically. Number of Addenda: 0

## 2019-07-20 DIAGNOSIS — I484 Atypical atrial flutter: Secondary | ICD-10-CM

## 2019-07-20 DIAGNOSIS — L8990 Pressure ulcer of unspecified site, unspecified stage: Secondary | ICD-10-CM | POA: Insufficient documentation

## 2019-07-20 LAB — CBC
HCT: 28.6 % — ABNORMAL LOW (ref 36.0–46.0)
Hemoglobin: 8.6 g/dL — ABNORMAL LOW (ref 12.0–15.0)
MCH: 27.7 pg (ref 26.0–34.0)
MCHC: 30.1 g/dL (ref 30.0–36.0)
MCV: 92 fL (ref 80.0–100.0)
Platelets: 318 10*3/uL (ref 150–400)
RBC: 3.11 MIL/uL — ABNORMAL LOW (ref 3.87–5.11)
RDW: 17.6 % — ABNORMAL HIGH (ref 11.5–15.5)
WBC: 8.1 10*3/uL (ref 4.0–10.5)
nRBC: 0 % (ref 0.0–0.2)

## 2019-07-20 LAB — URINE CULTURE: Culture: >=100000 — AB

## 2019-07-20 LAB — COMPREHENSIVE METABOLIC PANEL
ALT: 9 U/L (ref 0–44)
AST: 14 U/L — ABNORMAL LOW (ref 15–41)
Albumin: 2.3 g/dL — ABNORMAL LOW (ref 3.5–5.0)
Alkaline Phosphatase: 64 U/L (ref 38–126)
Anion gap: 14 (ref 5–15)
BUN: 87 mg/dL — ABNORMAL HIGH (ref 8–23)
CO2: 33 mmol/L — ABNORMAL HIGH (ref 22–32)
Calcium: 8.6 mg/dL — ABNORMAL LOW (ref 8.9–10.3)
Chloride: 96 mmol/L — ABNORMAL LOW (ref 98–111)
Creatinine, Ser: 3.37 mg/dL — ABNORMAL HIGH (ref 0.44–1.00)
GFR calc Af Amer: 15 mL/min — ABNORMAL LOW (ref >60)
GFR calc non Af Amer: 13 mL/min — ABNORMAL LOW (ref >60)
Glucose, Bld: 148 mg/dL — ABNORMAL HIGH (ref 70–99)
Potassium: 4.1 mmol/L (ref 3.5–5.1)
Sodium: 143 mmol/L (ref 135–145)
Total Bilirubin: 1.3 mg/dL — ABNORMAL HIGH (ref 0.3–1.2)
Total Protein: 7 g/dL (ref 6.5–8.1)

## 2019-07-20 LAB — GLUCOSE, CAPILLARY
Glucose-Capillary: 119 mg/dL — ABNORMAL HIGH (ref 70–99)
Glucose-Capillary: 105 mg/dL — ABNORMAL HIGH (ref 70–99)
Glucose-Capillary: 121 mg/dL — ABNORMAL HIGH (ref 70–99)
Glucose-Capillary: 107 mg/dL — ABNORMAL HIGH (ref 70–99)

## 2019-07-20 MED ORDER — SODIUM CHLORIDE 0.9 % IV BOLUS
250.0000 mL | Freq: Once | INTRAVENOUS | Status: AC
  Administered 2019-07-20: 250 mL via INTRAVENOUS

## 2019-07-20 MED ORDER — DILTIAZEM HCL-DEXTROSE 125-5 MG/125ML-% IV SOLN (PREMIX)
5.0000 mg/h | INTRAVENOUS | Status: DC
  Administered 2019-07-20: 08:00:00 5 mg/h via INTRAVENOUS

## 2019-07-20 MED ORDER — AMIODARONE HCL IN DEXTROSE 360-4.14 MG/200ML-% IV SOLN
30.0000 mg/h | INTRAVENOUS | Status: DC
  Administered 2019-07-20 – 2019-07-26 (×13): 30 mg/h via INTRAVENOUS
  Filled 2019-07-20 (×2): qty 200
  Filled 2019-07-20: qty 800
  Filled 2019-07-20 (×7): qty 200

## 2019-07-20 MED ORDER — DILTIAZEM HCL 25 MG/5ML IV SOLN
10.0000 mg | Freq: Once | INTRAVENOUS | Status: AC
  Administered 2019-07-20: 10 mg via INTRAVENOUS
  Filled 2019-07-20: qty 5

## 2019-07-20 MED ORDER — AMIODARONE HCL IN DEXTROSE 360-4.14 MG/200ML-% IV SOLN
60.0000 mg/h | INTRAVENOUS | Status: AC
  Administered 2019-07-20: 60 mg/h via INTRAVENOUS
  Filled 2019-07-20 (×2): qty 200

## 2019-07-20 NOTE — Consult Note (Addendum)
Cardiology Consult    Patient ID: [name]; [id number]; Apr 09, 1946   Admit date: 07/17/2019 Date of Consult: 07/20/2019  Primary Care Provider: [name], MD Primary Cardiologist: [name], MD   Patient Profile    [name] is a 73 y.o. female with past medical history of chronic diastolic CHF, CAD (nonobstructive CAD by cath in 2000), HTN, HLD, Stage 3 CKD , COPD, and OSA (on CPAP) who is being seen today for the evaluation of atrial fibrillation with RVR at the request of Dr. Roger Shelter.   History of Present Illness    Ms. Stampley was admitted to [facility] in 06/2019 for an acute CHF exacerbation in the setting of new onset atrial flutter with RVR. She did convert back to NSR on her own during admission and was discharged on Cardizem CD 120 mg daily and Toprol-XL 75 mg daily for rate control along with Eliquis 5mg  BID for anticoagulation. Weight was 229 lbs at the time of discharge along and she was placed on Torsemide 30mg  BID and Metolazone 2.5mg  MWF.   She was again admitted from 11/30 - 07/08/2019 and had an AKI (with creatinine peaking at 4.20) and UTI.  Nephrology was consulted and assisted with diuretic dosing. A discharge weight is not recorded but she was discharged on Torsemide 50 mg daily and Metolazone was discontinued. She did have a telehealth visit on 07/13/2019 and no changes were made to her medication regimen at that time.  She presented back to [facility] ED on 07/17/2019 for evaluation abdominal pain and vomiting. Reports of coffee-ground emesis per SNF staff. Denied any recent chest pain or dyspnea at that time.  Initial labs showed WBC 11.1, Hgb 7.8 (previously 8.7 two weeks prior), platelets 317, Na+ 139, K+ 4.4 and creatinine 3.76.  AST 12 and ALT 10.  Lipase 33. Lactic Acid 0.7. Initial and delta HS Troponin values flat at 19 and 20. COVID negative.  CT abdomen showed a stable large hiatal hernia and increased bibasilar atelectasis  with a right middle lobe 5 mm pulmonary nodule and repeat imaging recommended in 12 months. She was also noted to have diverticulosis without an acute inflammatory process. EKG showed sinus tachycardia, HR 109 with PAC's and TWI along lateral leads.   She was evaluated by GI and underwent EGD yesterday afternoon which showed mild erosive esophagitis and a mild Schatzki ring with dilation performed.  Overnight, she was ambulating to the restroom and nursing staff were notified of a rhythm change with rates elevated into the 160's. EKG appeared most consistent with atrial fibrillation with RVR but unable to exclude atrial flutter given her significantly elevated rates.  She was given 10 mg of IV Cardizem and transferred to the ICU for initiation of Cardizem drip. She is currently on IV Cardizem at 15 mg/hr with rates variable in the 140's to 160's.  She also received her PTA Toprol-XL 75 mg daily.  SBP had dropped into the 90's but SBP is currently in the 120's.  Past Medical History:  Diagnosis Date  . Blood transfusion without reported diagnosis   . CAD (coronary artery disease)    Nonobstructive at cardiac catheterization 2000  . Cataract   . Cervical cancer ([place]) 1978  . CHF (congestive heart failure) ([place])   . Chronic back pain   . Chronic kidney disease   . COPD (chronic obstructive pulmonary disease) ([place])   . Degenerative disc disease   . Essential hypertension, benign   .  GERD (gastroesophageal reflux disease)   . Gout   . Hiatal hernia 07/27/2013  . History of diverticulitis of colon   . History of hiatal hernia   . Iron deficiency anemia   . Irritable bowel syndrome   . Lumbar radiculopathy   . Mixed hyperlipidemia   . Neuropathy   . Osteoporosis   . Ovarian cancer [facility]) 1978   patient denies. States this was cervical cancer  . Oxygen deficiency   . Sleep apnea   . Type 2 diabetes mellitus ([place])   . Vitamin B deficiency 12/25/2009  . Vitamin B12 deficiency     Past  Surgical History:  Procedure Laterality Date  . ABDOMINAL HYSTERECTOMY    . BACK SURGERY    . Benign breast cysts    . CHOLECYSTECTOMY    . COLONOSCOPY  10/01/2006   SLF:Pan colonic diverticulosis and moderate internal hemorrhoids/ Otherwise no polyps, masses, inflammatory changes or AVMs/  . COLONOSCOPY  2011   SLF: pancolonic diverticulosis, large internal hemorrhoids  . COLONOSCOPY N/A 01/26/2016   Procedure: COLONOSCOPY;  Surgeon: [name], MD;  Location: [facility]ITE;  Service: Endoscopy;  Laterality: N/A;  830   . ESOPHAGOGASTRODUODENOSCOPY  11/19/2006   SLF: Large hiatal hernia without evidence of Cameron ulcers/. Distal esophageal stricture, which allowed the gastroscope to pass without resistance.  A 16 mm Savary later passed with mild resistance/ Normal stomach.sb bx negative  . ESOPHAGOGASTRODUODENOSCOPY  10/01/2006   GDJ:MEQAS hiatal hernia.  Distal esophagus without evidence of   erythema, ulceration or Barrett's esophagus  . ESOPHAGOGASTRODUODENOSCOPY  2011   SLF: large hh, distal esophageal web narrowing to 24mm s/p dilation to 54mm  . ESOPHAGOGASTRODUODENOSCOPY N/A 08/06/2013   SLF: 1. Stricture at the gastroesophageal junction 2. large hiatal hernia. 3. Mild erosive gastritis.  Marland Kitchen GIVENS CAPSULE STUDY N/A 08/06/2013   INCOMPLETE-SMALL BOWLE ULCERS  . KNEE SURGERY Right   . PARTIAL HYSTERECTOMY  1978  . small bowel capsule  2008   negative  . SPINE SURGERY    . TONSILLECTOMY AND ADENOIDECTOMY    . Two back surgeries/fusion    . UMBILICAL HERNIA REPAIR  2010     Home Medications:  Prior to Admission medications   Medication Sig Start Date End Date Taking? Authorizing Provider  albuterol (PROVENTIL) (2.5 MG/3ML) 0.083% nebulizer solution Take 2.5 mg by nebulization every 6 (six) hours as needed for wheezing or shortness of breath.  04/09/19  Yes [provider]  albuterol (VENTOLIN HFA) 108 (90 Base) MCG/ACT inhaler Inhale 2 puffs into the lungs every 6  (six) hours as needed for wheezing or shortness of breath.  04/09/19  Yes [provider]  fluconazole (DIFLUCAN) 50 MG tablet Take 50 mg by mouth daily. Started on 07/15/19 per Nurse at [facility].   Yes [provider]  mirtazapine (REMERON) 15 MG tablet Take 15 mg by mouth at bedtime.   Yes [provider]  NONFORMULARY OR COMPOUNDED ITEM May use CPaP from home at previous home settings while asleep. Every Shift Day, Evening, Night   Yes [provider]  OXYGEN Inhale 2 L into the lungs. Every Shift   Yes [provider]  allopurinol (ZYLOPRIM) 100 MG tablet Take 100 mg by mouth 2 (two) times daily.  10/09/12   [provider]  apixaban (ELIQUIS) 5 MG TABS tablet Take 1 tablet (5 mg total) by mouth 2 (two) times daily. 06/21/19   Murlean Iba, MD  aspirin EC 81 MG  tablet Take 81 mg by mouth daily.    [provider]  atorvastatin (LIPITOR) 40 MG tablet Take 1 tablet (40 mg total) by mouth at bedtime. 05/04/18   Satira Sark, MD  Biotin 5 MG CAPS Take 5 mg by mouth daily.    [provider]  diltiazem (CARDIZEM CD) 120 MG 24 hr capsule Take 1 capsule (120 mg total) by mouth daily. 06/22/19   Johnson, Clanford L, MD  esomeprazole (NEXIUM) 40 MG capsule TAKE 1 CAPSULE TWICE A DAY 30 MINUTES PRIOR TO MEALS 01/19/18   Annitta Needs, NP  famotidine (PEPCID) 40 MG tablet TAKE 1 TABLET AT BEDTIME AS NEEDED TO CONTROL REFLUX Patient not taking: Reported on 07/19/2019 06/23/15   Mannam, Hart Robinsons, MD  feeding supplement, GLUCERNA SHAKE, (GLUCERNA SHAKE) LIQD Take 237 mLs by mouth 2 (two) times daily between meals.    [provider]  hydrALAZINE (APRESOLINE) 25 MG tablet Take 25 mg by mouth 2 (two) times daily.    [provider]  LANTUS SOLOSTAR 100 UNIT/ML SOPN Inject 8 Units into the skin at bedtime.  10/06/12   [provider]  loperamide (IMODIUM) 2 MG capsule Take 2 mg by mouth every 6 (six) hours  as needed for diarrhea or loose stools.     [provider]  metoprolol succinate (TOPROL-XL) 25 MG 24 hr tablet Take 25 mg by mouth daily. Along with 50 mg to = 75 mg    [provider]  metoprolol succinate (TOPROL-XL) 50 MG 24 hr tablet Take 50 mg by mouth daily. Along with 25 mg to = 75 mg    [provider]  potassium chloride SA (KLOR-CON) 10 MEQ tablet Take 1 tablet (10 mEq total) by mouth every other day. 07/08/19   Johnson, Clanford L, MD  sertraline (ZOLOFT) 50 MG tablet Take 50 mg by mouth daily.    [provider]  tamsulosin (FLOMAX) 0.4 MG CAPS capsule Take 1 capsule (0.4 mg total) by mouth daily. 07/08/19   Johnson, Clanford L, MD  torsemide (DEMADEX) 10 MG tablet Take 5 tablets (50 mg total) by mouth daily. 07/09/19   Johnson, Clanford L, MD  TRELEGY ELLIPTA 100-62.5-25 MCG/INH AEPB Inhale 1 puff into the lungs daily. 05/13/19   [provider]  vitamin C (ASCORBIC ACID) 500 MG tablet Take 500 mg by mouth daily. 07/15/19 07/28/19  [provider]  zinc sulfate 220 (50 Zn) MG capsule Take 220 mg by mouth daily. 07/15/19 07/28/19  [provider]    Inpatient Medications: Scheduled Meds: . allopurinol  100 mg Oral BID  . atorvastatin  40 mg Oral QHS  . Chlorhexidine Gluconate Cloth  6 each Topical Daily  . famotidine  20 mg Oral QHS  . umeclidinium bromide  1 puff Inhalation Daily   And  . fluticasone furoate-vilanterol  1 puff Inhalation Daily  . hydrALAZINE  25 mg Oral BID  . insulin aspart  0-9 Units Subcutaneous TID WC  . insulin glargine  5 Units Subcutaneous QHS  . metoprolol succinate  75 mg Oral Daily  . pantoprazole (PROTONIX) IV  40 mg Intravenous Q12H  . sertraline  50 mg Oral Daily  . sodium chloride flush  3 mL Intravenous Q12H  . tamsulosin  0.4 mg Oral Daily   Continuous Infusions: . sodium chloride    . diltiazem (CARDIZEM) infusion 15 mg/hr (07/20/19 0843)   PRN Meds: sodium chloride,  acetaminophen **OR** acetaminophen, albuterol, sodium chloride flush  Allergies:  No Known Allergies  Social History:   Social History   Socioeconomic History  . Marital status: Widowed    Spouse name: Devall  . Number of children: 4  . Years of education: Not on file  . Highest education level: 10th grade  Occupational History  . Occupation: retired    Comment: Ambulance person, [employer] work  Tobacco Use  . Smoking status: Former Smoker    Packs/day: 1.00    Years: 1.00    Pack years: 1.00    Types: Cigarettes    Start date: 02/19/1961    Quit date: 08/05/1961    Years since quitting: 57.9  . Smokeless tobacco: Never Used  . Tobacco comment: Quit smoking x 50 years  Substance and Sexual Activity  . Alcohol use: No  . Drug use: No  . Sexual activity: Not Currently  Other Topics Concern  . Not on file  Social History Narrative   HAS 4 SON-GRAND Stanton.   Lives in home with Roediger - married 13 Y   Cook, sew, quilt, crochet   Social Determinants of Health   Financial Resource Strain:   . Difficulty of Paying Living Expenses: Not on file  Food Insecurity:   . Worried About Charity fundraiser in the Last Year: Not on file  . Ran Out of Food in the Last Year: Not on file  Transportation Needs:   . Lack of Transportation (Medical): Not on file  . Lack of Transportation (Non-Medical): Not on file  Physical Activity:   . Days of Exercise per Week: Not on file  . Minutes of Exercise per Session: Not on file  Stress:   . Feeling of Stress : Not on file  Social Connections:   . Frequency of Communication with Friends and Family: Not on file  . Frequency of Social Gatherings with Friends and Family: Not on file  . Attends Religious Services: Not on file  . Active Member of Clubs or Organizations: Not on file  . Attends Archivist Meetings: Not on file  . Marital Status: Not on file  Intimate Partner Violence:   . Fear of Current or Ex-Partner: Not  on file  . Emotionally Abused: Not on file  . Physically Abused: Not on file  . Sexually Abused: Not on file     Family History:    Family History  Problem Relation Age of Onset  . Colon cancer Brother        diagnosed age 8. Living.   Marland Kitchen Ulcers Sister   . Diabetes Sister   . Heart attack Sister   . Kidney failure Sister   . Stroke Sister   . Ulcers Mother   . Diabetes Mother   . Heart attack Mother   . Stroke Mother   . Asthma Mother   . Heart disease Mother   . Cervical cancer Mother   . Heart attack Brother   . Heart disease Brother   . Asthma Sister   . Diabetes Brother   . Stroke Maternal Grandmother   . Heart attack Maternal Grandmother   . Heart attack Other   . Early death Father        MVA in his 69s      Review of Systems    General:  No chills, fever, night sweats or weight changes.  Cardiovascular:  No chest pain, dyspnea on exertion, edema, orthopnea, palpitations, paroxysmal nocturnal dyspnea. Dermatological: No rash, lesions/masses Respiratory: No cough, dyspnea  Urologic: No hematuria, dysuria Abdominal:   No diarrhea, bright red blood per rectum, melena, or hematemesis. Positive for abdominal pain and vomiting.  Neurologic:  No visual changes, wkns, changes in mental status.  All other systems reviewed and are otherwise negative except as noted above.  Physical Exam/Data    Vitals:   07/20/19 0640 07/20/19 0700 07/20/19 0746 07/20/19 0800  BP: 132/74 (!) 144/63 100/60 104/68  Pulse: (!) 130 (!) 151 (!) 148 (!) 157  Resp: (!) 21 18 16 15   Temp: 98.1 F (36.7 C)   98.2 F (36.8 C)  TempSrc: Oral   Oral  SpO2: 96% 99% 97% 95%  Weight:      Height:        Intake/Output Summary (Last 24 hours) at 07/20/2019 0901 Last data filed at 07/19/2019 1706 Gross per 24 hour  Intake 400 ml  Output 0 ml  Net 400 ml   Filed Weights   07/17/19 2056 07/18/19 0500 07/20/19 0500  Weight: 83.1 kg 83.9 kg 81.6 kg   Body mass index is 36.33 kg/m.    General: Pleasant female appearing in NAD Psych: Normal affect. Neuro: Alert and oriented X 3. Moves all extremities spontaneously. HEENT: Normal  Neck: Supple without bruits or JVD. Lungs:  Resp regular and unlabored, CTA without wheezing or rales. Heart: Irregularly irregular. no s3, s4, or murmurs. Abdomen: Soft, non-tender, non-distended, BS + x 4.  Extremities: No clubbing, cyanosis or pitting edema. DP/PT/Radials 2+ and equal bilaterally.   EKG:  The EKG was personally reviewed and demonstrates: Sinus tachycardia, HR 109 with PAC's and TWI along lateral leads.   Telemetry:  Telemetry was personally reviewed and demonstrates: Atrial fibrillation, HR in 140's to 160's.    Labs/Studies     Relevant CV Studies:  Echocardiogram: 06/16/2019 IMPRESSIONS    1. Left ventricular ejection fraction, by visual estimation, is 55 to 60%. The left ventricle has normal function. There is no left ventricular hypertrophy.  2. Elevated left atrial pressure.  3. Left ventricular diastolic parameters are consistent with Grade II diastolic dysfunction (pseudonormalization).  4. Global right ventricle has normal systolic function.The right ventricular size is normal. No increase in right ventricular wall thickness.  5. Left atrial size was moderately dilated.  6. Right atrial size was normal.  7. The mitral valve was not well visualized. Trace mitral valve regurgitation. No evidence of mitral stenosis.  8. The tricuspid valve is normal in structure. Tricuspid valve regurgitation is mild.  9. The aortic valve was not well visualized. Aortic valve regurgitation is not visualized. No evidence of aortic valve sclerosis or stenosis. 10. The pulmonic valve was not well visualized. Pulmonic valve regurgitation not assessed. 11. The aortic root was not well visualized. 12. Moderately elevated pulmonary artery systolic pressure. 13. Moderate pulmonary HTN, PASP is 48 mmHg. 14. The inferior vena cava  is normal in size with greater than 50% respiratory variability, suggesting right atrial pressure of 3 mmHg. 15. The interatrial septum was not well visualized.  Laboratory Data:  Chemistry Recent Labs  Lab 07/17/19 0940 07/18/19 0803 07/20/19 0722  NA 139 143 143  K 4.4 4.2 4.1  CL 92* 99 96*  CO2 31 34* 33*  GLUCOSE 142* 111* 148*  BUN 97* 94* 87*  CREATININE 3.76* 3.36* 3.37*  CALCIUM 7.7* 8.0* 8.6*  GFRNONAA 11* 13* 13*  GFRAA 13* 15* 15*  ANIONGAP 16* 10 14    Recent Labs  Lab 07/17/19 0940 07/20/19 0722  PROT 6.7 7.0  ALBUMIN 2.3* 2.3*  AST 12* 14*  ALT 10 9  ALKPHOS 61 64  BILITOT 1.2 1.3*   Hematology Recent Labs  Lab 07/17/19 0940 07/18/19 0803 07/20/19 0722  WBC 11.1* 7.9 8.1  RBC 2.80* 2.94* 3.11*  HGB 7.8* 8.1* 8.6*  HCT 24.8* 26.5* 28.6*  MCV 88.6 90.1 92.0  MCH 27.9 27.6 27.7  MCHC 31.5 30.6 30.1  RDW 16.9* 17.2* 17.6*  PLT 317 285 318   Cardiac EnzymesNo results for input(s): TROPONINI in the last 168 hours. No results for input(s): TROPIPOC in the last 168 hours.  BNPNo results for input(s): BNP, PROBNP in the last 168 hours.  DDimer No results for input(s): DDIMER in the last 168 hours.  Radiology/Studies:  CT ABDOMEN PELVIS WO CONTRAST  Result Date: 07/17/2019 CLINICAL DATA:  Abdominal pain, nausea and vomiting EXAM: CT ABDOMEN AND PELVIS WITHOUT CONTRAST TECHNIQUE: Multidetector CT imaging of the abdomen and pelvis was performed following the standard protocol without IV contrast. COMPARISON:  None. FINDINGS: Lower chest: Increased bibasilar bandlike opacities consistent with atelectasis. Right middle lobe indeterminate small nodules noted, series 4, images 3 and 7. These measure 5 mm. Stable cardiomegaly. No pericardial or pleural effusion. Large hiatal hernia again noted with the majority of the stomach in the chest. Hepatobiliary: Limited without IV contrast. Remote cholecystectomy. No intrahepatic biliary dilatation or large focal  hepatic abnormality. Pancreas: Unremarkable. No pancreatic ductal dilatation or surrounding inflammatory changes. Spleen: Normal in size without focal abnormality. Adrenals/Urinary Tract: Normal adrenal glands. Kidneys demonstrate mild chronic perinephric strandy edema. Renal obstruction or hydronephrosis. No hydroureter or obstructing ureteral calculus adjacent chronic calcified gonadal vein phleboliths noted on the left. Urinary bladder unremarkable. Stomach/Bowel: Large hiatal hernia again noted. Majority of stomach in the chest. Negative for obstruction, significant dilatation, ileus, or free air. Extensive colonic diverticulosis without acute focal inflammatory process or wall thickening. No fluid collection, hemorrhage, hematoma, abscess or ascites. Vascular/Lymphatic: Abdominal atherosclerosis noted. Negative for aneurysm. No retroperitoneal hemorrhage or hematoma. No bulky adenopathy. Reproductive: Status post hysterectomy. No adnexal masses. Other: No abdominal wall hernia or abnormality. No abdominopelvic ascites. Musculoskeletal: Remote lumbar spinal fusion. No acute osseous finding. Improved body anasarca compared to the prior study. IMPRESSION: Stable large hiatal hernia with majority of the stomach in the chest. Increased bibasilar atelectasis Right middle lobe 5 mm pulmonary nodules remain indeterminate. No follow-up needed if patient is low-risk. Non-contrast chest CT can be considered in 12 months if patient is high-risk. This recommendation follows the consensus statement: Guidelines for Management of Incidental Pulmonary Nodules Detected on CT Images: From the Fleischner Society 2017; Radiology 2017; 284:228-243. Diverticulosis without acute inflammatory process Improved body anasarca Electronically Signed   By: Jerilynn Mages.  Shick M.D.   On: 07/17/2019 13:06   DG Chest Portable 1 View  Result Date: 07/17/2019 CLINICAL DATA:  Vomiting, abdominal pain. EXAM: PORTABLE CHEST 1 VIEW COMPARISON:  July 05, 2019. FINDINGS: Stable cardiomediastinal silhouette. Right internal jugular Port-A-Cath is unchanged. No pneumothorax or pleural effusion is noted. No acute pulmonary disease is noted. Bony thorax is unremarkable. Stable hiatal hernia. IMPRESSION: No acute cardiopulmonary abnormality seen.  Stable hiatal hernia. Electronically Signed   By: Marijo Conception M.D.   On: 07/17/2019 10:53   DG Abd Portable 1 View  Result Date: 07/17/2019 CLINICAL DATA:  Weakness, vomiting. EXAM: PORTABLE ABDOMEN - 1 VIEW COMPARISON:  September 03, 2010. FINDINGS: The bowel gas pattern is normal. No radio-opaque calculi or other significant radiographic abnormality are seen. IMPRESSION: Negative. Electronically  Signed   By: Marijo Conception M.D.   On: 07/17/2019 10:53     Assessment & Plan    1. Atrial Fibrillation/Flutter with RVR - she has a history of atrial flutter but converted back to normal sinus rhythm during admission in 06/2019.  She was in normal sinus rhythm at the time of this admission but went into atrial fibrillation with RVR overnight. Cannot exclude recurrent flutter given her significantly elevated rates making it difficult to determine her rhythm at this time. - was started on IV Cardizem at 15 mg/hr and has been continued on her PTA Toprol-XL 75 mg daily but rates remain elevated in the 140's to 160's. She is not a candidate for DCCV at this time given that anticoagulation has been held. Reviewed with Dr. Domenic Polite and will plan to start IV Amiodarone. - Eliquis is currently held in the setting of her GI bleed with plans to resume on 07/25/2019 per GI recommendations.   2. Chronic Diastolic CHF - she has experienced multiple recurrent admissions for acute CHF exacerbations. PTA Torsemide 50 mg has been held given her limited diet. Creatinine was 3.75 on admission, at 3.37 today. Would plan to resume once oral intake has improved. Dosing has been deferred to Nephrology in the setting of her Stage 3-4  CKD.  3. CAD - she had mild nonobstructive CAD by prior catheterization in 2000. Most recent echocardiogram in 06/2019 showed a preserved EF of 55 to 60% with no wall motion abnormalities. Troponin values have been flat at 19 and 20 this admission.  - she denies any recent chest pain.  - continue BB. Unclear why not on statin therapy. Would stop ASA given the need for anticoagulation.   4. Abdominal Pain/Coffee-Ground Emesis - Underwent EGD on 07/19/2019 which showed mild erosive esophagitis and a mild Schatzki ring with dilation performed. - Further management per GI. Hgb stable at 8.6 this AM.   5. OSA - continue with CPAP.  6. Stage 3-4 CKD - creatinine was at 2.87 at the time of hospital discharge on 07/08/2019.  Elevated to 3.75 at the time of this admission, improved to 3.37 today.   For questions or updates, please contact [place] Please consult [email or web address] for contact info under Cardiology/STEMI.  Signed, Erma Heritage, PA-C 07/20/2019, 9:01 AM Pager: [phone]   Attending note:  Patient seen and examined.  Chart reviewed and case discussed with Ms. Ahmed Prima PA-C.  Patient currently hospitalized after recent EGD for evaluation of abdominal pain and coffee-ground emesis.  She was found to have mild erosive esophagitis and Schatzki ring with dilatation.  Yesterday she developed what looks to be rapid atypical atrial flutter and has history of same back in November at which time she spontaneously converted with rate control.  She has not been on Eliquis recently due to GI bleed.  On examination this morning patient reports no sense of palpitations or chest pain.  She is relatively weak.  Afebrile, heart rate in the 098J, systolic blood pressure 191-478.  Lungs exhibit decreased breath sounds without wheezing.  Cardiac exam with irregularly irregular rhythm and no gallop.  She has been on intravenous diltiazem currently at 15 mg/h.  Lab work shows potassium 4.1,  BUN 87, creatinine 3.37, hemoglobin 8.6 up from 8.1, platelets 318.  ECG shows rapid atrial fibrillation versus atypical atrial flutter.  Plan is to initiate IV amiodarone, continue IV Cardizem.  Hopefully with better heart rate control she will convert as she did back in November.  Hold off on electrical cardioversion since off anticoagulation with recent GI bleed.  Satira Sark, M.D., F.A.C.C.

## 2019-07-20 NOTE — Progress Notes (Signed)
Patient transferred to ICU.  Notified son, [name] of patient transfer to patient ICU.  All questions answered.

## 2019-07-20 NOTE — Progress Notes (Signed)
Subjective: Denies nausea or vomiting. States her throat is a little sore after the procedure from yesterday. Denies abdominal pain. States she had a BM yesterday. States most of it was black. No BM today. No brbpr. Reports having black stools several days in a row about 1 week ago. States she did not receive clear liquid tray this morning. Desires to have clear liquids for lunch and then advance if she tolerates this well.   Objective: Vital signs in last 24 hours: Temp:  [98.1 F (36.7 C)-98.5 F (36.9 C)] 98.2 F (36.8 C) (12/15 0800) Pulse Rate:  [107-157] 157 (12/15 0800) Resp:  [12-22] 15 (12/15 0800) BP: (100-150)/(44-97) 104/68 (12/15 0800) SpO2:  [93 %-100 %] 95 % (12/15 0800) Weight:  [81.6 kg] 81.6 kg (12/15 0500) Last BM Date: 07/18/19 General:   Alert and oriented, pleasant Head:  Normocephalic and atraumatic. Eyes:  No icterus, sclera clear. Conjuctiva pink.  Abdomen:  Bowel sounds present, soft, non-distended. Mild tenderness to palpation in the epigastric area extending over to RUQ. No HSM or hernias noted. No rebound or guarding. No masses appreciated.  Pulses:  Normal pulses noted. Extremities:  Without edema. Neurologic:  Alert and  oriented x4;  grossly normal neurologically. Psych:  Normal mood and affect.  Intake/Output from previous day: 12/14 0701 - 12/15 0700 In: 400 [I.V.:400] Out: 0  Intake/Output this shift: No intake/output data recorded.  Lab Results: Recent Labs    07/17/19 0940 07/18/19 0803 07/20/19 0722  WBC 11.1* 7.9 8.1  HGB 7.8* 8.1* 8.6*  HCT 24.8* 26.5* 28.6*  PLT 317 285 318   BMET Recent Labs    07/17/19 0940 07/18/19 0803 07/20/19 0722  NA 139 143 143  K 4.4 4.2 4.1  CL 92* 99 96*  CO2 31 34* 33*  GLUCOSE 142* 111* 148*  BUN 97* 94* 87*  CREATININE 3.76* 3.36* 3.37*  CALCIUM 7.7* 8.0* 8.6*   LFT Recent Labs    07/17/19 0940 07/20/19 0722  PROT 6.7 7.0  ALBUMIN 2.3* 2.3*  AST 12* 14*  ALT 10 9  ALKPHOS 61  64  BILITOT 1.2 1.3*   Assessment: Pleasant 73 year old lady with numerous medical problems including atrial fibrillation for which has been chronically anticoagulated with Eliquis admitted to the hospital 1 week history of nausea and recent coffee-ground emesis.  Minimal drop in hemoglobin from baseline.  History of complicated GERD with stricture, chronic esophageal dysphagia and Cameron ulcers. Patient admitted to taking Aleve on a regular basis for back pain up until 1 week ago (stopped with onset of nausea). Underwent EGD on 07/19/19 with Mild erosive reflux esophagitis, Mild Schatzki ring s/p dilated, Large hiatal hernia, Erythematous gastric mucosa, and Normal duodenal bulb and second portion of the duodenum. Recommendations to avoid Aleve and all NSAIDs, continue PPI BID. If clinically indicated, resume Eliquis on 07/25/19. Upper GI bleed likely precipitated by NSAIDs and chronic anticoagulation. Today hemoglobin is stable/improved to 8.6. Denies nausea, vomiting, or abdominal pain. Reports 1 BM yesterday that was mostly black. No BM today. No brbpr. With hemoglobin improving, suspect the black stool is likely secondary to previous bleeding from esophageal erosions. Continue to monitor.   A fib with RVR: Chronic atrial fibrillation. Developed RVR this morning around 6am and patient was given cardizem IV and transferred to ICU. Cardiology consulted. Plans for amiodarone. No procedures planned. Ok with advancing diet as tolerated.    Plan: Monitor for overt GI bleeding.  Follow H/H Continue Protonix 40 mg BID.  Patient requesting clear liquid diet for lunch as she did not receive a tray this morning. If she tolerates this well, she can advance to soft diet with chopped meats.  Eliquis may be resumed on 07/25/19 if indicated.     LOS: 2 days    07/20/2019, 9:01 AM   Aliene Altes, [facility]

## 2019-07-20 NOTE — Progress Notes (Addendum)
Patient had a rhythm change after ambulating to the bathroom. Stat EKG done. A-fib with RVR. Patient c/o SOB. MD [place] notified with order for Cardizem 10mg  IV. Heart remains in the upper 140s after dose. MD [place] paged with order to transfer patient to step down unit. Called report to [place], Therapist, sports.

## 2019-07-20 NOTE — Progress Notes (Signed)
PT Cancellation Note  Patient Details Name: [name] MRN: [id number] DOB: [birth year]   Cancelled Treatment:    Reason Eval/Treat Not Completed: Medical issues which prohibited therapy.  Patient transferred to a higher level of care and will need new PT consult resume therapy when patient is medically stable.  Thank you.   1:40 PM, 07/20/19 Lonell Grandchild, MPT Physical Therapist with [facility] [phone] office [phone] mobile phone

## 2019-07-20 NOTE — Progress Notes (Signed)
Patient refuses the use of CPAP 

## 2019-07-20 NOTE — Progress Notes (Signed)
PROGRESS NOTE    [name]  OFB:[id number] DOB: 08/13/1945 DOA: 07/17/2019 PCP: [name], MD     Brief Narrative:  73 y.o. female with past medical history significant for diastolic heart failure, chronic kidney disease a stage IIIb; COPD, chronic respiratory failure with hypoxia, obesity, obstructive sleep apnea, atrial fibrillation (chronically on anticoagulation), gastroesophageal reflux disease/esophagitis, hiatal hernia, gout, type 2 diabetes with nephropathy and chronic anemia in the setting of renal failure; who presented to the hospital secondary to abdominal pain, nausea, vomiting and coffee-ground emesis.  Patient currently in nursing facility for rehabilitation purposes after recent admission due to acute on chronic heart failure exacerbation and UTI.  Apparently patient complains of sudden episode of nausea/vomiting with subsequent coffee-ground emesis x2 and nursing home.  Associated symptoms included abdominal pain, nonradiating, mid epigastric region, 5 out of 10 in intensity.  All her symptoms present on the day of admission.  Patient chronically on anticoagulation and compliant with medications prior to admission. She denies chest pain, headaches, blurred vision, focal weakness, dysuria, hematuria, melena, hematochezia, shortness of breath, worsening wheezing, palpitations, cough or any other complaints.  In the ED patient's hemoglobin was found to be slightly lower than baseline (at 7.8), renal function higher than from recent discharge at 3.76 and a BUN of 97.  No further episode of vomiting since she was in the emergency department.  Antiemetics have been provided.  TRH call to place patient in observation for further evaluation and management of coffee-ground emesis.  Urinalysis demonstrated pyuria but no other symptoms suggesting acute UTI.  Patient was  type and screen, gentle fluid resuscitation given and started on  PPI. =======================================================================  Subjective:  Patient was seen and examined this morning, remained stable. Overnight patient went to atrial fibrillation with RVR, this morning her heart rate was as high as 170s.  Patient was started on Cardizem drip dose was increased.  Patient remains stable awake alert, denies any chest pain.  Complaining of palpitation in her chest.  She is status post EGD she tolerated procedure well.     --------------------------------------------------------------------------------------------------------------------------------  Assessment & Plan:  Abdominal pain/nausea/vomiting/coffee-ground emesis -Status post EGD 07/19/2019 -confirm esophagitis, gastritis -GI recommended IV Protonix twice daily, clear liquid diet for now. Recommended Eliquis to be resumed on 07/25/2019 -No further GI bleed, H&H remained stable -Mostly gastritis/esophagitis  -GI following,  A. fib with RVR -Patient converted to A. fib with RVR overnight -Started on Cardizem drip -titrating for better rate control -Heart rate as high as 170s -Patient's home medication of Toprol-XL was initiated back this morning -Denies any chest pain, no acute changes on EKG -Patient denies any chest pain. -Cardiology consulted, appreciate input  OSA on CPAP -Continue CPAP nightly -Main stable no supplemental oxygen needed thus far  HLD (hyperlipidemia)  -Continue statins.  Hiatal hernia with GERD and esophagitis -As mentioned above continue IV PPI -As above status post EGD,  Type 2 diabetes with nephropathy (HCC) -Continue sliding scale insulin adjusted dose of Lantus nightly -follow CBGs closely.  Obesity, Class III, BMI 40-49.9 (morbid obesity) (HCC) -Body mass index is 36.33 kg/m. -Low calorie diet and portion control discussed with patient.  COPD (chronic obstructive pulmonary disease) (HCC) -With chronic respiratory failure -No  signs of acute exacerbation -Continue current oxygen supplementation -Continue home inhaler regimen.  Essential hypertension -Overall stable -Continue Toprol, Cardizem, hydralazine and Flomax.  Depression/anxiety -No suicidal ideation or hallucination -Continue Zoloft.  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 -Creatinine 3.76 >> 3.36 >> 3.37 currently.  Pyuria -Patient continues denying dysuria -Continue supportive care monitor labs -no antibiotics at this time.  Gout -no acute flare -continue allopurinol  DVT prophylaxis: SCDs Code Status: Full code Family Communication: No family at bedside. Disposition Plan: Remains inpatient, continue to follow hemoglobin trend.  Endoscopy anticipated on 07/19/2019.  Continue IV PPI  Consultants:   No GI service  Procedures:   Endoscopy planned for 07/19/2019  Antimicrobials:  Anti-infectives (From admission, onward)   None       Objective: Vitals:   07/20/19 0800 07/20/19 0845 07/20/19 0900 07/20/19 0915  BP: 104/68 98/68 121/82 (!) 116/39  Pulse: (!) 157 (!) 145 (!) 152 (!) 140  Resp: 15 (!) 28 17 19   Temp: 98.2 F (36.8 C)     TempSrc: Oral     SpO2: 95% 97% 95% 96%  Weight:      Height:        Intake/Output Summary (Last 24 hours) at 07/20/2019 1012 Last data filed at 07/19/2019 1706 Gross per 24 hour  Intake 400 ml  Output 0 ml  Net 400 ml  Physical Exam  Constitution:  Alert, cooperative, no distress,  Psychiatric: Normal and stable mood and affect, cognition intact,   HEENT: Normocephalic, PERRL, otherwise with in Normal limits  Chest:Chest symmetric, irregularly irregular heart rate, tachycardic Cardio vascular:  S1/S2,, No murmure, No  Rubs or Gallops  pulmonary: Clear to auscultation bilaterally, respirations unlabored, negative wheezes / crackles Abdomen: Soft, non-tender, non-distended, bowel sounds,no masses, no organomegaly Muscular skeletal: Limited exam - in bed, able to move all 4 extremities, Normal strength,  Neuro: CNII-XII intact. , normal motor and sensation, reflexes intact  Extremities: No pitting edema lower extremities, +2 pulses  Skin: Dry, warm to touch, negative for any Rashes, No open wounds Wounds: per nursing documentation Pressure Injury 07/17/19 Thigh Right Stage II -  Partial thickness loss of dermis presenting as a shallow open ulcer with a red, pink wound bed without slough. (Active)  07/17/19 2100  Location: Thigh  Location Orientation: Right  Staging: Stage II -  Partial thickness loss of dermis presenting as a shallow open ulcer with a red, pink wound bed without slough.  Wound Description (Comments):   Present on Admission:        Data Reviewed: I have personally reviewed following labs and imaging studies  CBC: Recent Labs  Lab 07/17/19 0940 07/18/19 0803 07/20/19 0722  WBC 11.1* 7.9 8.1  NEUTROABS 9.6*  --   --   HGB 7.8* 8.1* 8.6*  HCT 24.8* 26.5* 28.6*  MCV 88.6 90.1 92.0  PLT 317 285 017   Basic Metabolic Panel: Recent Labs  Lab 07/16/19 0430 07/17/19 0940 07/18/19 0803 07/20/19 0722  NA 140 139 143 143  K 4.4 4.4 4.2 4.1  CL 92* 92* 99 96*  CO2 33* 31 34* 33*  GLUCOSE 121* 142* 111* 148*  BUN 96* 97* 94* 87*  CREATININE 3.75* 3.76* 3.36* 3.37*  CALCIUM 7.8* 7.7* 8.0* 8.6*   GFR: Estimated Creatinine Clearance: 13.5 mL/min (A) (by C-G formula based on SCr of 3.37 mg/dL (H)).   Liver Function Tests: Recent Labs  Lab 07/17/19 0940 07/20/19 0722  AST 12* 14*  ALT 10 9  ALKPHOS 61 64  BILITOT 1.2 1.3*  PROT 6.7 7.0  ALBUMIN  2.3* 2.3*   Recent Labs  Lab 07/17/19 0940  LIPASE 33   CBG: Recent Labs  Lab 07/18/19 2140 07/19/19 0337  07/19/19 0734 07/19/19 1137 07/19/19 2217  GLUCAP 89 98 100* 93 111*   Urine analysis:    Component Value Date/Time   COLORURINE YELLOW 07/17/2019 0940   APPEARANCEUR TURBID (A) 07/17/2019 0940   LABSPEC 1.011 07/17/2019 0940   PHURINE 7.0 07/17/2019 0940   GLUCOSEU NEGATIVE 07/17/2019 0940   HGBUR SMALL (A) 07/17/2019 0940   BILIRUBINUR NEGATIVE 07/17/2019 0940   KETONESUR 5 (A) 07/17/2019 0940   PROTEINUR 100 (A) 07/17/2019 0940   UROBILINOGEN 0.2 01/29/2007 1500   NITRITE NEGATIVE 07/17/2019 0940   LEUKOCYTESUR LARGE (A) 07/17/2019 0940    Recent Results (from the past 240 hour(s))  SARS CORONAVIRUS 2 (TAT 6-24 HRS)     Status: None   Collection Time: 07/11/19  9:32 PM  Result Value Ref Range Status   SARS Coronavirus 2 NEGATIVE NEGATIVE Final    Comment: (NOTE) SARS-CoV-2 target nucleic acids are NOT DETECTED. The SARS-CoV-2 RNA is generally detectable in upper and lower respiratory specimens during the acute phase of infection. Negative results do not preclude SARS-CoV-2 infection, do not rule out co-infections with other pathogens, and should not be used as the sole basis for treatment or other patient management decisions. Negative results must be combined with clinical observations, patient history, and epidemiological information. The expected result is Negative. Fact Sheet for Patients: SugarRoll.be Fact Sheet for Healthcare Providers: https://www.woods-mathews.com/ This test is not yet approved or cleared by the [place] FDA and  has been authorized for detection and/or diagnosis of SARS-CoV-2 by FDA under an Emergency Use Authorization (EUA). This EUA will remain  in effect (meaning this test can be used) for the duration of the COVID-19 declaration under Section 56 4(b)(1) of the Act, 21 U.S.C. section 360bbb-3(b)(1), unless the authorization is terminated or revoked sooner. Performed at [facility], [place] [address], [place], [place] [address]   Urine culture     Status: Abnormal   Collection Time: 07/17/19  9:40 AM   Specimen: Urine, Clean Catch  Result Value Ref Range Status   Specimen Description   Final    URINE, CLEAN CATCH Performed at [facility], [address], [place], [place] [address]    Special Requests   Final    NONE Performed at [facility], [address], [place], [place] [address]    Culture >=100,000 COLONIES/mL ENTEROBACTER AEROGENES (A)  Final   Report Status 07/20/2019 FINAL  Final   Organism ID, Bacteria ENTEROBACTER AEROGENES (A)  Final      Susceptibility   Enterobacter aerogenes - MIC*    CEFAZOLIN >=64 RESISTANT Resistant     CEFTRIAXONE <=1 SENSITIVE Sensitive     CIPROFLOXACIN <=0.25 SENSITIVE Sensitive     GENTAMICIN <=1 SENSITIVE Sensitive     IMIPENEM 1 SENSITIVE Sensitive     NITROFURANTOIN 64 INTERMEDIATE Intermediate     TRIMETH/SULFA <=20 SENSITIVE Sensitive     PIP/TAZO <=4 SENSITIVE Sensitive     * >=100,000 COLONIES/mL ENTEROBACTER AEROGENES  SARS CORONAVIRUS 2 (TAT 6-24 HRS) Nasopharyngeal Nasopharyngeal Swab     Status: None   Collection Time: 07/17/19  2:31 PM   Specimen: Nasopharyngeal Swab  Result Value Ref Range Status   SARS Coronavirus 2 NEGATIVE NEGATIVE Final    Comment: (NOTE) SARS-CoV-2 target nucleic acids are NOT DETECTED. The SARS-CoV-2 RNA is generally detectable in upper and lower  respiratory specimens during the acute phase of infection. Negative results do not preclude SARS-CoV-2 infection, do not rule out co-infections with other pathogens, and should not be used as the sole basis for treatment or other patient management decisions. Negative results must be combined with clinical observations, patient history, and epidemiological information. The expected result is Negative. Fact Sheet for Patients: SugarRoll.be Fact Sheet for Healthcare  Providers: https://www.woods-mathews.com/ This test is not yet approved or cleared by the [place] FDA and  has been authorized for detection and/or diagnosis of SARS-CoV-2 by FDA under an Emergency Use Authorization (EUA). This EUA will remain  in effect (meaning this test can be used) for the duration of the COVID-19 declaration under Section 56 4(b)(1) of the Act, 21 U.S.C. section 360bbb-3(b)(1), unless the authorization is terminated or revoked sooner. Performed at [facility], [place] [address], [place], [place] [address]   MRSA PCR Screening     Status: None   Collection Time: 07/17/19 10:00 PM   Specimen: Nasal Mucosa; Nasopharyngeal  Result Value Ref Range Status   MRSA by PCR NEGATIVE NEGATIVE Final    Comment:        The GeneXpert MRSA Assay (FDA approved for NASAL specimens only), is one component of a comprehensive MRSA colonization surveillance program. It is not intended to diagnose MRSA infection nor to guide or monitor treatment for MRSA infections. Performed at [facility], [address] [place], [place] [address]      Radiology Studies: No results found.  Scheduled Meds: . allopurinol  100 mg Oral BID  . atorvastatin  40 mg Oral QHS  . Chlorhexidine Gluconate Cloth  6 each Topical Daily  . famotidine  20 mg Oral QHS  . umeclidinium bromide  1 puff Inhalation Daily   And  . fluticasone furoate-vilanterol  1 puff Inhalation Daily  . hydrALAZINE  25 mg Oral BID  . insulin aspart  0-9 Units Subcutaneous TID WC  . insulin glargine  5 Units Subcutaneous QHS  . metoprolol succinate  75 mg Oral Daily  . pantoprazole (PROTONIX) IV  40 mg Intravenous Q12H  . sertraline  50 mg Oral Daily  . sodium chloride flush  3 mL Intravenous Q12H  . tamsulosin  0.4 mg Oral Daily   Continuous Infusions: . sodium chloride    . amiodarone    . amiodarone    . diltiazem (CARDIZEM) infusion 15 mg/hr (07/20/19 0843)     LOS: 2 days    Time  spent: 30 minutes.   Deatra James, MD [facility] Pager [phone]   07/20/2019, 10:12 AM

## 2019-07-21 ENCOUNTER — Encounter: Payer: Self-pay | Admitting: Internal Medicine

## 2019-07-21 ENCOUNTER — Telehealth: Payer: Self-pay | Admitting: Gastroenterology

## 2019-07-21 ENCOUNTER — Encounter: Payer: Self-pay | Admitting: Adult Health

## 2019-07-21 ENCOUNTER — Encounter: Payer: Self-pay | Admitting: Gastroenterology

## 2019-07-21 DIAGNOSIS — G9340 Encephalopathy, unspecified: Secondary | ICD-10-CM | POA: Insufficient documentation

## 2019-07-21 DIAGNOSIS — A498 Other bacterial infections of unspecified site: Secondary | ICD-10-CM | POA: Insufficient documentation

## 2019-07-21 DIAGNOSIS — K920 Hematemesis: Secondary | ICD-10-CM

## 2019-07-21 DIAGNOSIS — F321 Major depressive disorder, single episode, moderate: Secondary | ICD-10-CM

## 2019-07-21 DIAGNOSIS — I480 Paroxysmal atrial fibrillation: Secondary | ICD-10-CM | POA: Insufficient documentation

## 2019-07-21 HISTORY — DX: Major depressive disorder, single episode, moderate: F32.1

## 2019-07-21 LAB — GLUCOSE, CAPILLARY
Glucose-Capillary: 107 mg/dL — ABNORMAL HIGH (ref 70–99)
Glucose-Capillary: 138 mg/dL — ABNORMAL HIGH (ref 70–99)
Glucose-Capillary: 105 mg/dL — ABNORMAL HIGH (ref 70–99)
Glucose-Capillary: 111 mg/dL — ABNORMAL HIGH (ref 70–99)

## 2019-07-21 MED ORDER — METOPROLOL TARTRATE 25 MG PO TABS
25.0000 mg | ORAL_TABLET | Freq: Two times a day (BID) | ORAL | Status: DC
  Administered 2019-07-21 – 2019-08-11 (×31): 25 mg via ORAL
  Filled 2019-07-21 (×37): qty 1

## 2019-07-21 NOTE — Telephone Encounter (Signed)
PATIENT SCHEDULED AND LETTER SENT  °

## 2019-07-21 NOTE — Progress Notes (Signed)
PROGRESS NOTE  [name] IWL:[id number] DOB: 10/31/1945 DOA: 07/17/2019 PCP: [name], MD  Brief History:  73 y.o.femalewith past medical history significant for diastolic heart failure, chronic kidney disease a stage IIIb; COPD, chronic respiratory failure with hypoxia, obesity, obstructive sleep apnea, atrial fibrillation (chronically on anticoagulation), gastroesophageal reflux disease/esophagitis, hiatal hernia, gout, type 2 diabetes with nephropathy and chronic anemia in the setting of renal failure; who presented to the hospital secondary to abdominal pain, nausea, vomiting and coffee-ground emesis. Patient currently in nursing facility for rehabilitation purposes after recent admission due to acute on chronic heart failure exacerbation and acute on chronic CKD4. Apparently patient complains of sudden episode of nausea/vomiting with subsequent coffee-ground emesis x2 and nursing home. Associated symptoms included abdominal pain, nonradiating, mid epigastric region, 5out of 10 in intensity.All hersymptoms present on the day of admission. Patient chronically on anticoagulation and compliant with medications prior to admission. She denies chest pain, headaches, blurred vision, focal weakness, dysuria, hematuria, melena, hematochezia, shortness of breath, worsening wheezing, palpitations, cough or any other complaints.  In the ED patient's hemoglobin was found to be slightly lower than baseline (at 7.8), renal function higher than from recent discharge at 3.76 and a BUN of 97. No further episode of vomiting since she was in the emergency department. Antiemetics have been provided. TRH call to place patient in observation for further evaluation and management of coffee-ground emesis. Urinalysis demonstrated pyuria but no other symptoms suggesting acute UTI. Patient wastype and screen, gentle fluid resuscitation given and started on PPI.  During evening of  12/14-12/15, she developed atrial fib with RVR.  She was started on amiodarone drip due to soft BPs.  Cardiology was consulted to assist.  Assessment/Plan: Upper GI Bleed/Hematemesis -due NSAIDS and erosive esophagitis -EGD on 07/19/19 with Mild erosive reflux esophagitis, Mild Schatzki ring s/p dilated, Large hiatal hernia, Erythematous gastric mucosa, and Normal duodenal bulb and second portion of the duodenum. -avoid Aleve and all NSAIDs, continue PPI BID -resume apixaban on 07/25/19 per GI -Upper GI bleed likely precipitated by NSAIDs and chronic anticoagulation -Hgb stable  Atrial Fibrillation with RVR, type unspecified -hx of atypical atrial flutter -appreciate cardiology followup  -continue amiodarone drip -d/c hydralazine to allow BP margin for titration of meds -decreased metoprolol (succinate 75 mg to tartrate 25 mg bid) -holding apixaban till 17/40/81  Chronic Diastolic CHF -holding torsemide temporarily due to acute on chronic renal failure -am BMP -daily weights -06/16/19 echo EF 55-60%, G2DD, mild MR, trace TR, PASP 48  Acute on chronic renal failure--CKD stage IV -Baseline creatinine 2.8-3.1 -Patient was previously taking torsemide 100 mg daily, and was decreased to 50 mg at the time of her last hospital discharge with instructions to titrate back up to home dose as tolerated -A.m. BMP  Nonobstructive CAD by history.   -High-sensitivity troponin I levels are not suggestive of ACS.  Chronic respiratory failure with hypoxia -Patient is normally on 2 L at nighttime and as needed during the day  Type 2 diabetes with nephropathy ([place]) -Continue sliding scale insulin adjusted dose of Lantus nightly -07/05/19 A1C--6.8  Obesity, Class III, BMI 40-49.9 (morbid obesity) ([place]) -Body mass index is 36.33 kg/m. -Low calorie diet and portion control discussed with patient.  COPD (chronic obstructive pulmonary disease) ([facility]) -With chronic respiratory failure -No signs  of acute exacerbation -Continue current oxygen supplementation -Continue home inhaler regimen.  Essential hypertension -Overall stable -Continue Toprol, Cardizem, hydralazine and Flomax.  Depression/anxiety -No suicidal ideation or hallucination -Continue Zoloft.  Gouty arthritis -Continue allopurinol        Disposition Plan:   Home in 2-3 days  Family Communication:  No Family at bedside  Consultants:  GI, cardiology  Code Status:  FULL   DVT Prophylaxis:  [place] Heparin / [place] Lovenox   Procedures: As Listed in Progress Note Above  Antibiotics: None   Total time spent 35 minutes.  Greater than 50% spent face to face counseling and coordinating care.     Subjective: Patient denies fevers, chills, headache, chest pain, dyspnea, nausea, vomiting, diarrhea, abdominal pain, dysuria, hematuria, hematochezia, and melena.   Objective: Vitals:   07/21/19 0930 07/21/19 0953 07/21/19 1000 07/21/19 1030  BP: 119/89 106/67 109/73 125/73  Pulse: (!) 107 (!) 125 (!) 132 (!) 106  Resp: 16  12 (!) 8  Temp:      TempSrc:      SpO2: 97%  98% 99%  Weight:      Height:        Intake/Output Summary (Last 24 hours) at 07/21/2019 1147 Last data filed at 07/21/2019 5956 Gross per 24 hour  Intake 794.53 ml  Output --  Net 794.53 ml   Weight change: -0.1 kg Exam:   General:  Pt is alert, follows commands appropriately, not in acute distress  HEENT: No icterus, No thrush, No neck mass, [place]/AT  Cardiovascular: RRR, S1/S2, no rubs, no gallops  Respiratory: bibasilar rales. No wheeze  Abdomen: Soft/+BS, non tender, non distended, no guarding  Extremities: trace LE edema, No lymphangitis, No petechiae, No rashes, no synovitis   Data Reviewed: I have personally reviewed following labs and imaging studies Basic Metabolic Panel: Recent Labs  Lab 07/16/19 0430 07/17/19 0940 07/18/19 0803 07/20/19 0722  NA 140 139 143 143  K 4.4 4.4 4.2 4.1  CL 92* 92* 99 96*   CO2 33* 31 34* 33*  GLUCOSE 121* 142* 111* 148*  BUN 96* 97* 94* 87*  CREATININE 3.75* 3.76* 3.36* 3.37*  CALCIUM 7.8* 7.7* 8.0* 8.6*   Liver Function Tests: Recent Labs  Lab 07/17/19 0940 07/20/19 0722  AST 12* 14*  ALT 10 9  ALKPHOS 61 64  BILITOT 1.2 1.3*  PROT 6.7 7.0  ALBUMIN 2.3* 2.3*   Recent Labs  Lab 07/17/19 0940  LIPASE 33   No results for input(s): AMMONIA in the last 168 hours. Coagulation Profile: No results for input(s): INR, PROTIME in the last 168 hours. CBC: Recent Labs  Lab 07/17/19 0940 07/18/19 0803 07/20/19 0722  WBC 11.1* 7.9 8.1  NEUTROABS 9.6*  --   --   HGB 7.8* 8.1* 8.6*  HCT 24.8* 26.5* 28.6*  MCV 88.6 90.1 92.0  PLT 317 285 318   Cardiac Enzymes: No results for input(s): CKTOTAL, CKMB, CKMBINDEX, TROPONINI in the last 168 hours. BNP: Invalid input(s): POCBNP CBG: Recent Labs  Lab 07/20/19 1121 07/20/19 1636 07/20/19 2109 07/21/19 0801 07/21/19 1138  GLUCAP 105* 121* 107* 107* 138*   HbA1C: No results for input(s): HGBA1C in the last 72 hours. Urine analysis:    Component Value Date/Time   COLORURINE YELLOW 07/17/2019 0940   APPEARANCEUR TURBID (A) 07/17/2019 0940   LABSPEC 1.011 07/17/2019 0940   PHURINE 7.0 07/17/2019 0940   GLUCOSEU NEGATIVE 07/17/2019 0940   HGBUR SMALL (A) 07/17/2019 0940   BILIRUBINUR NEGATIVE 07/17/2019 0940   KETONESUR 5 (A) 07/17/2019 0940   PROTEINUR 100 (A) 07/17/2019 0940   UROBILINOGEN 0.2 01/29/2007 1500  NITRITE NEGATIVE 07/17/2019 0940   LEUKOCYTESUR LARGE (A) 07/17/2019 0940   Sepsis Labs: @LABRCNTIP (procalcitonin:4,lacticidven:4) ) Recent Results (from the past 240 hour(s))  SARS CORONAVIRUS 2 (Koji Niehoff 6-24 HRS)     Status: None   Collection Time: 07/11/19  9:32 PM  Result Value Ref Range Status   SARS Coronavirus 2 NEGATIVE NEGATIVE Final    Comment: (NOTE) SARS-CoV-2 target nucleic acids are NOT DETECTED. The SARS-CoV-2 RNA is generally detectable in upper and  lower respiratory specimens during the acute phase of infection. Negative results do not preclude SARS-CoV-2 infection, do not rule out co-infections with other pathogens, and should not be used as the sole basis for treatment or other patient management decisions. Negative results must be combined with clinical observations, patient history, and epidemiological information. The expected result is Negative. Fact Sheet for Patients: SugarRoll.be Fact Sheet for Healthcare Providers: https://www.woods-mathews.com/ This test is not yet approved or cleared by the [place] FDA and  has been authorized for detection and/or diagnosis of SARS-CoV-2 by FDA under an Emergency Use Authorization (EUA). This EUA will remain  in effect (meaning this test can be used) for the duration of the COVID-19 declaration under Section 56 4(b)(1) of the Act, 21 U.S.C. section 360bbb-3(b)(1), unless the authorization is terminated or revoked sooner. Performed at [facility], [place] [address], [place], [place] [address]   Urine culture     Status: Abnormal   Collection Time: 07/17/19  9:40 AM   Specimen: Urine, Clean Catch  Result Value Ref Range Status   Specimen Description   Final    URINE, CLEAN CATCH Performed at [facility], [address] [place], [place] [address]    Special Requests   Final    NONE Performed at [facility], [address], [place], [place] [address]    Culture >=100,000 COLONIES/mL ENTEROBACTER AEROGENES (A)  Final   Report Status 07/20/2019 FINAL  Final   Organism ID, Bacteria ENTEROBACTER AEROGENES (A)  Final      Susceptibility   Enterobacter aerogenes - MIC*    CEFAZOLIN >=64 RESISTANT Resistant     CEFTRIAXONE <=1 SENSITIVE Sensitive     CIPROFLOXACIN <=0.25 SENSITIVE Sensitive     GENTAMICIN <=1 SENSITIVE Sensitive     IMIPENEM 1 SENSITIVE Sensitive     NITROFURANTOIN 64 INTERMEDIATE Intermediate     TRIMETH/SULFA  <=20 SENSITIVE Sensitive     PIP/TAZO <=4 SENSITIVE Sensitive     * >=100,000 COLONIES/mL ENTEROBACTER AEROGENES  SARS CORONAVIRUS 2 (Janya Eveland 6-24 HRS) Nasopharyngeal Nasopharyngeal Swab     Status: None   Collection Time: 07/17/19  2:31 PM   Specimen: Nasopharyngeal Swab  Result Value Ref Range Status   SARS Coronavirus 2 NEGATIVE NEGATIVE Final    Comment: (NOTE) SARS-CoV-2 target nucleic acids are NOT DETECTED. The SARS-CoV-2 RNA is generally detectable in upper and lower respiratory specimens during the acute phase of infection. Negative results do not preclude SARS-CoV-2 infection, do not rule out co-infections with other pathogens, and should not be used as the sole basis for treatment or other patient management decisions. Negative results must be combined with clinical observations, patient history, and epidemiological information. The expected result is Negative. Fact Sheet for Patients: SugarRoll.be Fact Sheet for Healthcare Providers: https://www.woods-mathews.com/ This test is not yet approved or cleared by the [place] FDA and  has been authorized for detection and/or diagnosis of SARS-CoV-2 by FDA under an Emergency Use Authorization (EUA). This EUA will remain  in effect (meaning this test  can be used) for the duration of the COVID-19 declaration under Section 56 4(b)(1) of the Act, 21 U.S.C. section 360bbb-3(b)(1), unless the authorization is terminated or revoked sooner. Performed at [facility], [place] [address], [place], [place] [address]   MRSA PCR Screening     Status: None   Collection Time: 07/17/19 10:00 PM   Specimen: Nasal Mucosa; Nasopharyngeal  Result Value Ref Range Status   MRSA by PCR NEGATIVE NEGATIVE Final    Comment:        The GeneXpert MRSA Assay (FDA approved for NASAL specimens only), is one component of a comprehensive MRSA colonization surveillance program. It is not intended to diagnose  MRSA infection nor to guide or monitor treatment for MRSA infections. Performed at [facility], [address], [place], [place] [address]      Scheduled Meds: . allopurinol  100 mg Oral BID  . atorvastatin  40 mg Oral QHS  . Chlorhexidine Gluconate Cloth  6 each Topical Daily  . famotidine  20 mg Oral QHS  . umeclidinium bromide  1 puff Inhalation Daily   And  . fluticasone furoate-vilanterol  1 puff Inhalation Daily  . insulin aspart  0-9 Units Subcutaneous TID WC  . insulin glargine  5 Units Subcutaneous QHS  . metoprolol tartrate  25 mg Oral BID  . pantoprazole (PROTONIX) IV  40 mg Intravenous Q12H  . sertraline  50 mg Oral Daily  . sodium chloride flush  3 mL Intravenous Q12H  . tamsulosin  0.4 mg Oral Daily   Continuous Infusions: . sodium chloride    . amiodarone 30 mg/hr (07/21/19 0358)  . diltiazem (CARDIZEM) infusion Stopped (07/20/19 1028)    Procedures/Studies: CT ABDOMEN PELVIS WO CONTRAST  Result Date: 07/17/2019 CLINICAL DATA:  Abdominal pain, nausea and vomiting EXAM: CT ABDOMEN AND PELVIS WITHOUT CONTRAST TECHNIQUE: Multidetector CT imaging of the abdomen and pelvis was performed following the standard protocol without IV contrast. COMPARISON:  None. FINDINGS: Lower chest: Increased bibasilar bandlike opacities consistent with atelectasis. Right middle lobe indeterminate small nodules noted, series 4, images 3 and 7. These measure 5 mm. Stable cardiomegaly. No pericardial or pleural effusion. Large hiatal hernia again noted with the majority of the stomach in the chest. Hepatobiliary: Limited without IV contrast. Remote cholecystectomy. No intrahepatic biliary dilatation or large focal hepatic abnormality. Pancreas: Unremarkable. No pancreatic ductal dilatation or surrounding inflammatory changes. Spleen: Normal in size without focal abnormality. Adrenals/Urinary Tract: Normal adrenal glands. Kidneys demonstrate mild chronic perinephric strandy edema. Renal  obstruction or hydronephrosis. No hydroureter or obstructing ureteral calculus adjacent chronic calcified gonadal vein phleboliths noted on the left. Urinary bladder unremarkable. Stomach/Bowel: Large hiatal hernia again noted. Majority of stomach in the chest. Negative for obstruction, significant dilatation, ileus, or free air. Extensive colonic diverticulosis without acute focal inflammatory process or wall thickening. No fluid collection, hemorrhage, hematoma, abscess or ascites. Vascular/Lymphatic: Abdominal atherosclerosis noted. Negative for aneurysm. No retroperitoneal hemorrhage or hematoma. No bulky adenopathy. Reproductive: Status post hysterectomy. No adnexal masses. Other: No abdominal wall hernia or abnormality. No abdominopelvic ascites. Musculoskeletal: Remote lumbar spinal fusion. No acute osseous finding. Improved body anasarca compared to the prior study. IMPRESSION: Stable large hiatal hernia with majority of the stomach in the chest. Increased bibasilar atelectasis Right middle lobe 5 mm pulmonary nodules remain indeterminate. No follow-up needed if patient is low-risk. Non-contrast chest CT can be considered in 12 months if patient is high-risk. This recommendation follows the consensus statement: Guidelines for Management of Incidental  Pulmonary Nodules Detected on CT Images: From the Fleischner Society 2017; Radiology 2017; [phone]. Diverticulosis without acute inflammatory process Improved body anasarca Electronically Signed   By: Jerilynn Mages.  Shick M.D.   On: 07/17/2019 13:06   DG Chest 2 View  Result Date: 07/05/2019 CLINICAL DATA:  Congestion and shortness of breath EXAM: CHEST - 2 VIEW COMPARISON:  Chest radiograph July 04, 2019 FINDINGS: Port-A-Cath tip is in the superior vena cava. No pneumothorax. There is mild interstitial prominence with suspected mild interstitial edema. No consolidation. Heart is mildly enlarged with pulmonary venous hypertension. No evident adenopathy. There  is a large paraesophageal hernia. There is degenerative change in the thoracic spine. IMPRESSION: There is a degree of pulmonary vascular congestion with mild interstitial edema. No consolidation. There may be a degree of congestive heart failure. Sizable paraesophageal hernia noted. Port-A-Cath tip in superior vena cava. Electronically Signed   By: Lowella Grip III M.D.   On: 07/05/2019 10:52   CT Head Wo Contrast  Result Date: 07/05/2019 CLINICAL DATA:  Unexplained altered level of consciousness. EXAM: CT HEAD WITHOUT CONTRAST TECHNIQUE: Contiguous axial images were obtained from the base of the skull through the vertex without intravenous contrast. COMPARISON:  06/27/2015 FINDINGS: Brain: Age related atrophy. Mild chronic small-vessel change of the hemispheric white matter and pons. 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 No sign of mass lesion, hemorrhage, hydrocephalus or extra-axial collection. Vascular: There is atherosclerotic calcification of the major vessels at the base of the brain. Skull: Negative Sinuses/Orbits: Paranasal sinuses are clear. Orbits are negative. Mastoid effusion on the right. Other: None IMPRESSION: No acute intracranial finding. Atrophy and chronic small-vessel ischemic changes as seen previously. Electronically Signed   By: Nelson Chimes M.D.   On: 07/05/2019 11:04   US RENAL  Result Date: 07/06/2019 CLINICAL DATA:  Acute kidney injury. EXAM: RENAL / URINARY TRACT ULTRASOUND COMPLETE COMPARISON:  Noncontrast abdominal CT 05/31/2019, renal ultrasound 09/10/2017 FINDINGS: Right Kidney: Renal measurements: 8.9 x 5.6 x 6.1 cm = volume: 160 mL. No hydronephrosis. There is renal cortical thinning with increased parenchymal echogenicity. Mild scarring. Simple cyst measuring 1.2 cm in the upper kidney again seen. No suspicious solid lesion. No shadowing calculi. Left Kidney: Renal measurements: 9.0 x 5.5 x 5.0 cm = volume: 132 mL. No hydronephrosis. There is  thinning of renal parenchyma with increased renal echogenicity. Bladder: No bladder wall thickening. Both ureteral jets are visualized. Reported post void residual of 284 cc. Prevoid bladder volume not recorded. Other: None. IMPRESSION: 1. No hydronephrosis. 2. Thinning of bilateral renal parenchyma with increased renal echogenicity most consistent with chronic medical renal disease. 3. Post void residual of 284 cc. Electronically Signed   By: Keith Rake M.D.   On: 07/06/2019 12:45   DG Chest Portable 1 View  Result Date: 07/17/2019 CLINICAL DATA:  Vomiting, abdominal pain. EXAM: PORTABLE CHEST 1 VIEW COMPARISON:  July 05, 2019. FINDINGS: Stable cardiomediastinal silhouette. Right internal jugular Port-A-Cath is unchanged. No pneumothorax or pleural effusion is noted. No acute pulmonary disease is noted. Bony thorax is unremarkable. Stable hiatal hernia. IMPRESSION: No acute cardiopulmonary abnormality seen.  Stable hiatal hernia. Electronically Signed   By: Marijo Conception M.D.   On: 07/17/2019 10:53   DG Chest Portable 1 View  Result Date: 07/04/2019 CLINICAL DATA:  Altered mental status. Recent positive blood culture EXAM: PORTABLE CHEST 1 VIEW COMPARISON:  June 15, 2019 FINDINGS: Port-A-Cath tip is in the superior vena cava. No pneumothorax. There is no appreciable  edema or consolidation. There is slight atelectatic change in the left lower lobe. Heart is upper normal in size with pulmonary vascularity normal. No adenopathy. 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 No bone lesions. IMPRESSION: Large paraesophageal hernia. Mild left base atelectasis. No edema or consolidation. Stable cardiac silhouette. Port-A-Cath tip in superior vena cava. Electronically Signed   By: Lowella Grip III M.D.   On: 07/04/2019 11:26   DG Abd Portable 1 View  Result Date: 07/17/2019 CLINICAL DATA:  Weakness, vomiting. EXAM: PORTABLE ABDOMEN - 1 VIEW COMPARISON:  September 03, 2010. FINDINGS: The bowel  gas pattern is normal. No radio-opaque calculi or other significant radiographic abnormality are seen. IMPRESSION: Negative. Electronically Signed   By: Marijo Conception M.D.   On: 07/17/2019 10:53    Orson Eva, DO  [facility] Pager [phone]  If 7PM-7AM, please contact night-coverage [email or web address] Password [email or web address] 07/21/2019, 11:47 AM   LOS: 3 days

## 2019-07-21 NOTE — Progress Notes (Signed)
Subjective:  Denies abdominal pain, vomiting.  2 small soft brown stools today.  No blood in the stool or melena.  Feels hungry.  Objective: Vital signs in last 24 hours: Temp:  [97.6 F (36.4 C)-98.6 F (37 C)] 97.7 F (36.5 C) (12/16 0815) Pulse Rate:  [72-158] 106 (12/16 1030) Resp:  [8-28] 8 (12/16 1030) BP: (84-125)/(45-89) 125/73 (12/16 1030) SpO2:  [95 %-99 %] 99 % (12/16 1030) Weight:  [81.5 kg] 81.5 kg (12/16 0500) Last BM Date: 07/20/19 General:   Alert,  Well-developed, well-nourished, pleasant and cooperative in NAD Head:  Normocephalic and atraumatic. Eyes:  Sclera clear, no icterus.  Abdomen:  Soft, nontender and nondistended Extremities:  Without clubbing, deformity or edema. Neurologic:  Alert and  oriented x4;  grossly normal neurologically. Psych:  Alert and cooperative. Normal mood and affect.  Intake/Output from previous day: 12/15 0701 - 12/16 0700 In: 784.5 [P.O.:120; I.V.:664.5] Out: -  Intake/Output this shift: Total I/O In: 10 [I.V.:10] Out: -   Lab Results: CBC Recent Labs    07/20/19 0722  WBC 8.1  HGB 8.6*  HCT 28.6*  MCV 92.0  PLT 318   BMET Recent Labs    07/20/19 0722  NA 143  K 4.1  CL 96*  CO2 33*  GLUCOSE 148*  BUN 87*  CREATININE 3.37*  CALCIUM 8.6*   LFTs Recent Labs    07/20/19 0722  BILITOT 1.3*  ALKPHOS 64  AST 14*  ALT 9  PROT 7.0  ALBUMIN 2.3*   No results for input(s): LIPASE in the last 72 hours. PT/INR No results for input(s): LABPROT, INR in the last 72 hours.    Imaging Studies: CT ABDOMEN PELVIS WO CONTRAST  Result Date: 07/17/2019 CLINICAL DATA:  Abdominal pain, nausea and vomiting EXAM: CT ABDOMEN AND PELVIS WITHOUT CONTRAST TECHNIQUE: Multidetector CT imaging of the abdomen and pelvis was performed following the standard protocol without IV contrast. COMPARISON:  None. FINDINGS: Lower chest: Increased bibasilar bandlike opacities consistent with atelectasis. Right middle lobe indeterminate  small nodules noted, series 4, images 3 and 7. These measure 5 mm. Stable cardiomegaly. No pericardial or pleural effusion. Large hiatal hernia again noted with the majority of the stomach in the chest. Hepatobiliary: Limited without IV contrast. Remote cholecystectomy. No intrahepatic biliary dilatation or large focal hepatic abnormality. Pancreas: Unremarkable. No pancreatic ductal dilatation or surrounding inflammatory changes. Spleen: Normal in size without focal abnormality. Adrenals/Urinary Tract: Normal adrenal glands. Kidneys demonstrate mild chronic perinephric strandy edema. Renal obstruction or hydronephrosis. No hydroureter or obstructing ureteral calculus adjacent chronic calcified gonadal vein phleboliths noted on the left. Urinary bladder unremarkable. Stomach/Bowel: Large hiatal hernia again noted. Majority of stomach in the chest. Negative for obstruction, significant dilatation, ileus, or free air. Extensive colonic diverticulosis without acute focal inflammatory process or wall thickening. No fluid collection, hemorrhage, hematoma, abscess or ascites. Vascular/Lymphatic: Abdominal atherosclerosis noted. Negative for aneurysm. No retroperitoneal hemorrhage or hematoma. No bulky adenopathy. Reproductive: Status post hysterectomy. No adnexal masses. Other: No abdominal wall hernia or abnormality. No abdominopelvic ascites. Musculoskeletal: Remote lumbar spinal fusion. No acute osseous finding. Improved body anasarca compared to the prior study. IMPRESSION: Stable large hiatal hernia with majority of the stomach in the chest. Increased bibasilar atelectasis Right middle lobe 5 mm pulmonary nodules remain indeterminate. No follow-up needed if patient is low-risk. Non-contrast chest CT can be considered in 12 months if patient is high-risk. This recommendation follows the consensus statement: Guidelines for Management of Incidental Pulmonary Nodules Detected on CT  Images: From the Fleischner Society  2017; Radiology 2017; [phone]. Diverticulosis without acute inflammatory process Improved body anasarca Electronically Signed   By: Jerilynn Mages.  Shick M.D.   On: 07/17/2019 13:06   DG Chest 2 View  Result Date: 07/05/2019 CLINICAL DATA:  Congestion and shortness of breath EXAM: CHEST - 2 VIEW COMPARISON:  Chest radiograph July 04, 2019 FINDINGS: Port-A-Cath tip is in the superior vena cava. No pneumothorax. There is mild interstitial prominence with suspected mild interstitial edema. No consolidation. Heart is mildly enlarged with pulmonary venous hypertension. No evident adenopathy. 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 There is degenerative change in the thoracic spine. IMPRESSION: There is a degree of pulmonary vascular congestion with mild interstitial edema. No consolidation. There may be a degree of congestive heart failure. Sizable paraesophageal hernia noted. Port-A-Cath tip in superior vena cava. Electronically Signed   By: Lowella Grip III M.D.   On: 07/05/2019 10:52   CT Head Wo Contrast  Result Date: 07/05/2019 CLINICAL DATA:  Unexplained altered level of consciousness. EXAM: CT HEAD WITHOUT CONTRAST TECHNIQUE: Contiguous axial images were obtained from the base of the skull through the vertex without intravenous contrast. COMPARISON:  06/27/2015 FINDINGS: Brain: Age related atrophy. Mild chronic small-vessel change of the hemispheric white matter and pons. 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 No sign of mass lesion, hemorrhage, hydrocephalus or extra-axial collection. Vascular: There is atherosclerotic calcification of the major vessels at the base of the brain. Skull: Negative Sinuses/Orbits: Paranasal sinuses are clear. Orbits are negative. Mastoid effusion on the right. Other: None IMPRESSION: No acute intracranial finding. Atrophy and chronic small-vessel ischemic changes as seen previously. Electronically Signed   By: Nelson Chimes M.D.   On: 07/05/2019 11:04   US  RENAL  Result Date: 07/06/2019 CLINICAL DATA:  Acute kidney injury. EXAM: RENAL / URINARY TRACT ULTRASOUND COMPLETE COMPARISON:  Noncontrast abdominal CT 05/31/2019, renal ultrasound 09/10/2017 FINDINGS: Right Kidney: Renal measurements: 8.9 x 5.6 x 6.1 cm = volume: 160 mL. No hydronephrosis. There is renal cortical thinning with increased parenchymal echogenicity. Mild scarring. Simple cyst measuring 1.2 cm in the upper kidney again seen. No suspicious solid lesion. No shadowing calculi. Left Kidney: Renal measurements: 9.0 x 5.5 x 5.0 cm = volume: 132 mL. No hydronephrosis. There is thinning of renal parenchyma with increased renal echogenicity. Bladder: No bladder wall thickening. Both ureteral jets are visualized. Reported post void residual of 284 cc. Prevoid bladder volume not recorded. Other: None. IMPRESSION: 1. No hydronephrosis. 2. Thinning of bilateral renal parenchyma with increased renal echogenicity most consistent with chronic medical renal disease. 3. Post void residual of 284 cc. Electronically Signed   By: Keith Rake M.D.   On: 07/06/2019 12:45   DG Chest Portable 1 View  Result Date: 07/17/2019 CLINICAL DATA:  Vomiting, abdominal pain. EXAM: PORTABLE CHEST 1 VIEW COMPARISON:  July 05, 2019. FINDINGS: Stable cardiomediastinal silhouette. Right internal jugular Port-A-Cath is unchanged. No pneumothorax or pleural effusion is noted. No acute pulmonary disease is noted. Bony thorax is unremarkable. Stable hiatal hernia. IMPRESSION: No acute cardiopulmonary abnormality seen.  Stable hiatal hernia. Electronically Signed   By: Marijo Conception M.D.   On: 07/17/2019 10:53   DG Chest Portable 1 View  Result Date: 07/04/2019 CLINICAL DATA:  Altered mental status. Recent positive blood culture EXAM: PORTABLE CHEST 1 VIEW COMPARISON:  June 15, 2019 FINDINGS: Port-A-Cath tip is in the superior vena cava. No pneumothorax. There is no appreciable edema or consolidation. There is  slight  atelectatic change in the left lower lobe. Heart is upper normal in size with pulmonary vascularity normal. No adenopathy. 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 No bone lesions. IMPRESSION: Large paraesophageal hernia. Mild left base atelectasis. No edema or consolidation. Stable cardiac silhouette. Port-A-Cath tip in superior vena cava. Electronically Signed   By: Lowella Grip III M.D.   On: 07/04/2019 11:26   DG Abd Portable 1 View  Result Date: 07/17/2019 CLINICAL DATA:  Weakness, vomiting. EXAM: PORTABLE ABDOMEN - 1 VIEW COMPARISON:  September 03, 2010. FINDINGS: The bowel gas pattern is normal. No radio-opaque calculi or other significant radiographic abnormality are seen. IMPRESSION: Negative. Electronically Signed   By: Marijo Conception M.D.   On: 07/17/2019 10:53  [2 weeks]   Assessment:  73 year old lady with atrial fibrillation, chronically anticoagulated with Eliquis, admitted with 1 week history of nausea and recent coffee-ground emesis.  Minimal drop in hemoglobin from baseline.  History of complicated GERD with stricture, chronic esophageal dysphagia and Cameron ulcers.  Patient admits to frequent Aleve use on a regular basis for back pain up until 1 week ago when she stopped due to nausea.  EGD on 12/14 with mild erosive reflux esophagitis, mild Schatzki ring status post dilation, large hiatal hernia, erythematous gastric mucosa.  Clinically feeling much better.  No signs of ongoing overt GI bleeding.  Plan: 1. Avoid Aleve and all NSAIDs. 2. Pantoprazole 40 mg twice daily.   3. Follow H&H. 4. If clinically indicated, resume Eliquis on 07/25/19. 5. Will sign off for now.  Call with any questions. 6. Return to the office in 3 months for follow-up.  Laureen Ochs. Bernarda Caffey [facility] [phone] 12/16/202011:23 AM     LOS: 3 days

## 2019-07-21 NOTE — Progress Notes (Signed)
Progress Note  Patient Name: [name] Date of Encounter: 07/21/2019  Primary Cardiologist: [name], MD  Subjective   Patient somewhat more alert this morning.  Still with poor appetite.  No abdominal pain.  She does not report sense of palpitations or chest pain.  Inpatient Medications    Scheduled Meds: . allopurinol  100 mg Oral BID  . atorvastatin  40 mg Oral QHS  . Chlorhexidine Gluconate Cloth  6 each Topical Daily  . famotidine  20 mg Oral QHS  . umeclidinium bromide  1 puff Inhalation Daily   And  . fluticasone furoate-vilanterol  1 puff Inhalation Daily  . hydrALAZINE  25 mg Oral BID  . insulin aspart  0-9 Units Subcutaneous TID WC  . insulin glargine  5 Units Subcutaneous QHS  . metoprolol succinate  75 mg Oral Daily  . pantoprazole (PROTONIX) IV  40 mg Intravenous Q12H  . sertraline  50 mg Oral Daily  . sodium chloride flush  3 mL Intravenous Q12H  . tamsulosin  0.4 mg Oral Daily   Continuous Infusions: . sodium chloride    . amiodarone 30 mg/hr (07/21/19 0358)  . diltiazem (CARDIZEM) infusion Stopped (07/20/19 1028)   PRN Meds: sodium chloride, acetaminophen **OR** acetaminophen, albuterol, sodium chloride flush   Vital Signs    Vitals:   07/21/19 0700 07/21/19 0709 07/21/19 0715 07/21/19 0815  BP: 103/71  118/83 104/74  Pulse:  (!) 110  (!) 133  Resp:  20  10  Temp:      TempSrc:      SpO2:  98%  98%  Weight:      Height:        Intake/Output Summary (Last 24 hours) at 07/21/2019 0911 Last data filed at 07/21/2019 0319 Gross per 24 hour  Intake 784.53 ml  Output --  Net 784.53 ml   Filed Weights   07/18/19 0500 07/20/19 0500 07/21/19 0500  Weight: 83.9 kg 81.6 kg 81.5 kg    Telemetry    Atrial fibrillation/atypical atrial flutter with RVR.  Personally reviewed.  ECG    An ECG dated 07/20/2019 was personally reviewed today and demonstrated:  Atrial fibrillation versus atypical atrial flutter with RVR.  Physical  Exam   GEN:  Elderly woman.  No acute distress.   Neck: No JVD. Cardiac:  Irregularly irregular, no gallop.  Respiratory:  Decreased breath sounds without wheezing. GI: Soft, nontender, bowel sounds present. MS: No edema; No deformity. Neuro:  Nonfocal. Psych: Alert and oriented x 3. Normal affect.  Labs    Chemistry Recent Labs  Lab 07/17/19 0940 07/18/19 0803 07/20/19 0722  NA 139 143 143  K 4.4 4.2 4.1  CL 92* 99 96*  CO2 31 34* 33*  GLUCOSE 142* 111* 148*  BUN 97* 94* 87*  CREATININE 3.76* 3.36* 3.37*  CALCIUM 7.7* 8.0* 8.6*  PROT 6.7  --  7.0  ALBUMIN 2.3*  --  2.3*  AST 12*  --  14*  ALT 10  --  9  ALKPHOS 61  --  64  BILITOT 1.2  --  1.3*  GFRNONAA 11* 13* 13*  GFRAA 13* 15* 15*  ANIONGAP 16* 10 14     Hematology Recent Labs  Lab 07/17/19 0940 07/18/19 0803 07/20/19 0722  WBC 11.1* 7.9 8.1  RBC 2.80* 2.94* 3.11*  HGB 7.8* 8.1* 8.6*  HCT 24.8* 26.5* 28.6*  MCV 88.6 90.1 92.0  MCH 27.9 27.6 27.7  MCHC 31.5 30.6 30.1  RDW  16.9* 17.2* 17.6*  PLT 317 285 318    Cardiac Enzymes Recent Labs  Lab 07/05/19 1753 07/05/19 1955 07/05/19 2157 07/17/19 0940 07/17/19 1042  TROPONINIHS 52* 50* 51* 19* 20*    Radiology    No results found.  Cardiac Studies   Echocardiogram 06/16/2019:  1. Left ventricular ejection fraction, by visual estimation, is 55 to 60%. The left ventricle has normal function. There is no left ventricular hypertrophy.  2. Elevated left atrial pressure.  3. Left ventricular diastolic parameters are consistent with Grade II diastolic dysfunction (pseudonormalization).  4. Global right ventricle has normal systolic function.The right ventricular size is normal. No increase in right ventricular wall thickness.  5. Left atrial size was moderately dilated.  6. Right atrial size was normal.  7. The mitral valve was not well visualized. Trace mitral valve regurgitation. No evidence of mitral stenosis.  8. The tricuspid valve is  normal in structure. Tricuspid valve regurgitation is mild.  9. The aortic valve was not well visualized. Aortic valve regurgitation is not visualized. No evidence of aortic valve sclerosis or stenosis. 10. The pulmonic valve was not well visualized. Pulmonic valve regurgitation not assessed. 11. The aortic root was not well visualized. 12. Moderately elevated pulmonary artery systolic pressure. 13. Moderate pulmonary HTN, PASP is 48 mmHg. 14. The inferior vena cava is normal in size with greater than 50% respiratory variability, suggesting right atrial pressure of 3 mmHg. 15. The interatrial septum was not well visualized.  Patient Profile     73 y.o. female with past medical history of chronic diastolic CHF, CAD (nonobstructive CAD by cath in 2000), HTN, HLD, Stage 3 CKD , COPD, and OSA (on CPAP) presenting to the hospital after recent coffee-ground emesis with EGD showing mild erosive esophagitis and Schatzki ring requiring dilatation.  She subsequently developed atypical atrial flutter with RVR requiring further management.  Assessment & Plan    1.  Atrial fibrillation versus atypical atrial flutter with RVR.  Heart rate has come down on amiodarone, but she has not converted back to sinus as yet.  Blood pressure has trended down and she is now off intravenous diltiazem.  2.  Chronic diastolic heart failure.  Recent LVEF 55 to 60% by echocardiogram in November.  No pulmonary edema or significant fluid overload.  Diuretics are currently on hold.  3.  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  Current creatinine 3.37.  4.  Recent GI bleed with associated anemia.  EGD on December 14 showed mild erosive esophagitis and Schatzki ring requiring dilatation.  Hemoglobin has increased to 8.6.  She remains off anticoagulation.  5.  OSA on CPAP.  6.  Nonobstructive CAD by history.  High-sensitivity troponin I levels are not suggestive of ACS.  Continue IV amiodarone.  Stop  hydralazine with low blood pressures.  Reduce oral beta-blocker dose but try and continue if possible.  Not back on anticoagulation as yet with recent GI bleed, do not plan to pursue electrical cardioversion at this point.  Focus will be on obtaining better heart rate control, although she might convert back to sinus rhythm on amiodarone.  Signed, [name], MD  07/21/2019, 9:11 AM

## 2019-07-21 NOTE — Addendum Note (Signed)
Addendum  created 07/21/19 1545 by Mickel Baas, CRNA   Charge Capture section accepted

## 2019-07-21 NOTE — Progress Notes (Signed)
Patient refuses CPAP 

## 2019-07-21 NOTE — Telephone Encounter (Signed)
Please schedule hospital follow-up in 3 months with Dr. Oneida Alar, [facility], or [facility].

## 2019-07-22 DIAGNOSIS — N184 Chronic kidney disease, stage 4 (severe): Secondary | ICD-10-CM

## 2019-07-22 DIAGNOSIS — N179 Acute kidney failure, unspecified: Secondary | ICD-10-CM

## 2019-07-22 LAB — BASIC METABOLIC PANEL
Anion gap: 13 (ref 5–15)
BUN: 98 mg/dL — ABNORMAL HIGH (ref 8–23)
CO2: 33 mmol/L — ABNORMAL HIGH (ref 22–32)
Calcium: 8.3 mg/dL — ABNORMAL LOW (ref 8.9–10.3)
Chloride: 93 mmol/L — ABNORMAL LOW (ref 98–111)
Creatinine, Ser: 5.55 mg/dL — ABNORMAL HIGH (ref 0.44–1.00)
GFR calc Af Amer: 8 mL/min — ABNORMAL LOW (ref >60)
GFR calc non Af Amer: 7 mL/min — ABNORMAL LOW (ref >60)
Glucose, Bld: 113 mg/dL — ABNORMAL HIGH (ref 70–99)
Potassium: 4.1 mmol/L (ref 3.5–5.1)
Sodium: 139 mmol/L (ref 135–145)

## 2019-07-22 LAB — URINALYSIS, ROUTINE W REFLEX MICROSCOPIC
Glucose, UA: NEGATIVE mg/dL
Nitrite: NEGATIVE
Protein, ur: 100 mg/dL — AB
Specific Gravity, Urine: >1.03 — ABNORMAL HIGH (ref 1.005–1.030)
pH: 6 (ref 5.0–8.0)

## 2019-07-22 LAB — MAGNESIUM: Magnesium: 1.6 mg/dL — ABNORMAL LOW (ref 1.7–2.4)

## 2019-07-22 LAB — HEMOGLOBIN AND HEMATOCRIT, BLOOD
HCT: 25 % — ABNORMAL LOW (ref 36.0–46.0)
Hemoglobin: 7.6 g/dL — ABNORMAL LOW (ref 12.0–15.0)

## 2019-07-22 LAB — NOVEL CORONAVIRUS, NAA (HOSP ORDER, SEND-OUT TO REF LAB; TAT 18-24 HRS): SARS-CoV-2, NAA: NOT DETECTED

## 2019-07-22 LAB — URINALYSIS, MICROSCOPIC (REFLEX): WBC, UA: >50 WBC/hpf (ref 0–5)

## 2019-07-22 LAB — CBC
HCT: 24 % — ABNORMAL LOW (ref 36.0–46.0)
Hemoglobin: 7.3 g/dL — ABNORMAL LOW (ref 12.0–15.0)
MCH: 27.9 pg (ref 26.0–34.0)
MCHC: 30.4 g/dL (ref 30.0–36.0)
MCV: 91.6 fL (ref 80.0–100.0)
Platelets: 286 10*3/uL (ref 150–400)
RBC: 2.62 MIL/uL — ABNORMAL LOW (ref 3.87–5.11)
RDW: 16.9 % — ABNORMAL HIGH (ref 11.5–15.5)
WBC: 7 10*3/uL (ref 4.0–10.5)
nRBC: 0 % (ref 0.0–0.2)

## 2019-07-22 LAB — GLUCOSE, CAPILLARY
Glucose-Capillary: 108 mg/dL — ABNORMAL HIGH (ref 70–99)
Glucose-Capillary: 137 mg/dL — ABNORMAL HIGH (ref 70–99)
Glucose-Capillary: 140 mg/dL — ABNORMAL HIGH (ref 70–99)
Glucose-Capillary: 95 mg/dL (ref 70–99)

## 2019-07-22 MED ORDER — DARBEPOETIN ALFA 150 MCG/0.3ML IJ SOSY
150.0000 ug | PREFILLED_SYRINGE | INTRAMUSCULAR | Status: DC
  Administered 2019-07-22 – 2019-08-05 (×3): 150 ug via SUBCUTANEOUS
  Filled 2019-07-22 (×3): qty 0.3

## 2019-07-22 MED ORDER — SODIUM CHLORIDE 0.9 % IV SOLN: INTRAVENOUS | Status: DC

## 2019-07-22 MED ORDER — SODIUM CHLORIDE 0.9 % IV SOLN
2.0000 g | INTRAVENOUS | Status: DC
  Filled 2019-07-22: qty 20

## 2019-07-22 MED ORDER — CIPROFLOXACIN HCL 500 MG PO TABS
500.0000 mg | ORAL_TABLET | Freq: Every day | ORAL | Status: DC
  Administered 2019-07-22 – 2019-07-25 (×4): 500 mg via ORAL
  Filled 2019-07-22: qty 2
  Filled 2019-07-22: qty 1
  Filled 2019-07-22 (×2): qty 2

## 2019-07-22 NOTE — Consult Note (Signed)
[facility] Renal Consultation Note  Requesting MD: Tat Indication for Consultation: A on CRF  HPI:  [name] is a 73 y.o. female with past medical history significant for diabetes mellitus, obesity, OSA, COPD with hypoxia, atrial fibrillation as well as has fairly advanced CKD with creatinine in the threes of late.  She has had a couple of hospitalizations in the last month-first for volume overload, the second for altered mental status-discharged on 12/3 with creatinine 2.87.  She was then readmitted on 12/11 with coffee-ground emesis and abdominal pain.  Looks like labs have been checked every other day.  On 12/13 BUN/creatinine 94 3.36, on 12/15 Creatinine 87 and 3.37.  Then, today labs revealed a BUN and creatinine of 98 and 5.55 and that is the reason for consultation.  There has been no urine output recorded since 12/13.  There have been very low blood pressures recorded in the setting of atrial fibrillation diltiazem drip.  Of note, patient had some difficulty with bladder outlet obstruction during last hospitalization.  Her volume status actually seems very good right now.  She also does not seem to be uremic.  She was able to tell me that in Mar 21, 2023 her husband died, she became despondent stop taking care of herself.  However, she has improved in the department since.  Urinalysis on 12/12 looks to have a UTI with Enterobacter.  Does not appear that it was treated  Creat  Date/Time Value Ref Range Status  10/10/2017 10:50 AM 1.68 (H) 0.60 - 0.93 mg/dL Final    Comment:    For patients >31 years of age, the reference limit for Creatinine is approximately 13% higher for people identified as African-American. .   07/11/2017 04:19 PM 1.96 (H) 0.60 - 0.93 mg/dL Final    Comment:    For patients >28 years of age, the reference limit for Creatinine is approximately 13% higher for people identified as African-American. .    Creatinine, Ser  Date/Time Value Ref  Range Status  07/22/2019 03:50 AM 5.55 (H) 0.44 - 1.00 mg/dL Final    Comment:    DELTA CHECK NOTED  07/20/2019 07:22 AM 3.37 (H) 0.44 - 1.00 mg/dL Final  07/18/2019 08:03 AM 3.36 (H) 0.44 - 1.00 mg/dL Final  07/17/2019 09:40 AM 3.76 (H) 0.44 - 1.00 mg/dL Final  07/16/2019 04:30 AM 3.75 (H) 0.44 - 1.00 mg/dL Final  07/08/2019 05:02 AM 2.87 (H) 0.44 - 1.00 mg/dL Final  07/07/2019 04:48 AM 3.12 (H) 0.44 - 1.00 mg/dL Final  07/06/2019 04:47 AM 3.95 (H) 0.44 - 1.00 mg/dL Final  07/05/2019 07:55 PM 4.20 (H) 0.44 - 1.00 mg/dL Final  07/04/2019 11:32 AM 3.31 (H) 0.44 - 1.00 mg/dL Final  06/30/2019 11:05 AM 3.43 (H) 0.44 - 1.00 mg/dL Final  06/28/2019 08:30 AM 3.42 (H) 0.44 - 1.00 mg/dL Final  06/20/2019 06:41 AM 2.24 (H) 0.44 - 1.00 mg/dL Final  06/19/2019 06:58 AM 2.44 (H) 0.44 - 1.00 mg/dL Final  06/18/2019 05:05 AM 2.60 (H) 0.44 - 1.00 mg/dL Final  06/17/2019 09:16 AM 2.63 (H) 0.44 - 1.00 mg/dL Final  06/16/2019 07:30 AM 1.98 (H) 0.44 - 1.00 mg/dL Final  06/15/2019 01:40 PM 2.05 (H) 0.44 - 1.00 mg/dL Final  06/07/2019 04:07 AM 2.64 (H) 0.44 - 1.00 mg/dL Final  06/05/2019 04:46 AM 2.74 (H) 0.44 - 1.00 mg/dL Final  06/04/2019 05:01 AM 2.36 (H) 0.44 - 1.00 mg/dL Final  06/04/2019 05:01 AM 2.36 (H) 0.44 - 1.00 mg/dL Final  06/03/2019  07:05 AM 2.33 (H) 0.44 - 1.00 mg/dL Final  06/02/2019 04:15 AM 1.80 (H) 0.44 - 1.00 mg/dL Final  05/31/2019 04:58 PM 1.64 (H) 0.44 - 1.00 mg/dL Final  11/22/2018 09:11 PM 1.21 (H) 0.44 - 1.00 mg/dL Final  07/15/2018 05:27 AM 1.98 (H) 0.44 - 1.00 mg/dL Final  07/14/2018 05:19 AM 2.09 (H) 0.44 - 1.00 mg/dL Final  07/13/2018 11:23 AM 2.14 (H) 0.44 - 1.00 mg/dL Final  07/13/2018 08:09 AM 2.17 (H) 0.44 - 1.00 mg/dL Final  07/13/2018 06:24 AM 2.11 (H) 0.44 - 1.00 mg/dL Final  07/12/2018 01:35 PM 1.37 (H) 0.44 - 1.00 mg/dL Final  08/28/2017 11:27 AM 1.64 (H) 0.44 - 1.00 mg/dL Final  05/06/2017 03:25 PM 1.47 (H) 0.44 - 1.00 mg/dL Final  04/28/2017 10:11 AM 1.33 (H)  0.44 - 1.00 mg/dL Final  10/25/2016 11:25 AM 1.86 (H) 0.44 - 1.00 mg/dL Final  05/10/2016 06:09 AM 1.69 (H) 0.44 - 1.00 mg/dL Final  05/09/2016 05:38 AM 1.47 (H) 0.44 - 1.00 mg/dL Final  05/08/2016 12:30 AM 1.27 (H) 0.44 - 1.00 mg/dL Final  04/02/2016 12:00 PM 1.05 (H) 0.44 - 1.00 mg/dL Final  06/28/2015 05:43 AM 1.13 (H) 0.44 - 1.00 mg/dL Final  06/27/2015 07:58 PM 1.22 (H) 0.44 - 1.00 mg/dL Final  07/07/2013 06:32 AM 1.26 (H) 0.50 - 1.10 mg/dL Final  07/06/2013 06:20 AM 1.56 (H) 0.50 - 1.10 mg/dL Final  07/05/2013 09:53 AM 1.74 (H) 0.50 - 1.10 mg/dL Final  07/01/2013 06:18 AM 1.17 (H) 0.50 - 1.10 mg/dL Final  06/30/2013 09:54 AM 1.16 (H) 0.50 - 1.10 mg/dL Final  06/28/2013 04:15 PM 1.01 0.50 - 1.10 mg/dL Final  10/25/2008 04:12 PM 1.04 0.4 - 1.2 mg/dL Final  02/02/2007 03:20 AM 1.22 DELTA CHECK NOTED (H)  Final  01/31/2007 05:20 AM 2.22 (H)  Final  01/22/2007 01:38 PM 1.27 (H)  Final     PMHx:   Past Medical History:  Diagnosis Date  . Blood transfusion without reported diagnosis   . CAD (coronary artery disease)    Nonobstructive at cardiac catheterization 2000  . Cataract   . Cervical cancer ([place]) 1978  . CHF (congestive heart failure) ([place])   . Chronic back pain   . Chronic kidney disease   . COPD (chronic obstructive pulmonary disease) ([place])   . Degenerative disc disease   . Essential hypertension, benign   . GERD (gastroesophageal reflux disease)   . Gout   . Hiatal hernia 07/27/2013  . History of diverticulitis of colon   . History of hiatal hernia   . Iron deficiency anemia   . Irritable bowel syndrome   . Lumbar radiculopathy   . Mixed hyperlipidemia   . 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 Neuropathy   . Osteoporosis   . Ovarian cancer [facility]) 1978   patient denies. States this was cervical cancer  . Oxygen deficiency   . Sleep apnea   . Type 2 diabetes mellitus ([place])   . Vitamin B deficiency 12/25/2009  . Vitamin B12 deficiency      Past Surgical History:  Procedure Laterality Date  . ABDOMINAL HYSTERECTOMY    . BACK SURGERY    . Benign breast cysts    . CHOLECYSTECTOMY    . COLONOSCOPY  10/01/2006   SLF:Pan colonic diverticulosis and moderate internal hemorrhoids/ Otherwise no polyps, masses, inflammatory changes or AVMs/  . COLONOSCOPY  2011   SLF: pancolonic diverticulosis, large internal hemorrhoids  . COLONOSCOPY N/A 01/26/2016   Procedure:  COLONOSCOPY;  Surgeon: [name], MD;  Location: [facility];  Service: Endoscopy;  Laterality: N/A;  830   . ESOPHAGOGASTRODUODENOSCOPY  11/19/2006   SLF: Large hiatal hernia without evidence of Cameron ulcers/. Distal esophageal stricture, which allowed the gastroscope to pass without resistance.  A 16 mm Savary later passed with mild resistance/ Normal stomach.sb bx negative  . ESOPHAGOGASTRODUODENOSCOPY  10/01/2006   GEZ:MOQHU hiatal hernia.  Distal esophagus without evidence of   erythema, ulceration or Barrett's esophagus  . ESOPHAGOGASTRODUODENOSCOPY  2011   SLF: large hh, distal esophageal web narrowing to 7mm s/p dilation to 29mm  . ESOPHAGOGASTRODUODENOSCOPY N/A 08/06/2013   SLF: 1. Stricture at the gastroesophageal junction 2. large hiatal hernia. 3. Mild erosive gastritis.  Marland Kitchen ESOPHAGOGASTRODUODENOSCOPY (EGD) WITH PROPOFOL N/A 07/19/2019   Procedure: ESOPHAGOGASTRODUODENOSCOPY (EGD) WITH POSSIBLE ESOPHAGEAL DILATION WITH PROPOFOL;  Surgeon: Daneil Dolin, MD;  Location: [facility];  Service: Endoscopy;  Laterality: N/A;  . GIVENS CAPSULE STUDY N/A 08/06/2013   INCOMPLETE-SMALL BOWLE ULCERS  . KNEE SURGERY Right   . PARTIAL HYSTERECTOMY  1978  . small bowel capsule  2008   negative  . SPINE SURGERY    . TONSILLECTOMY AND ADENOIDECTOMY    . Two back surgeries/fusion    . UMBILICAL HERNIA REPAIR  2010    Family Hx:  Family History  Problem Relation Age of Onset  . Colon cancer Brother        diagnosed age 62. Living.   Marland Kitchen Ulcers Sister   .  Diabetes Sister   . Heart attack Sister   . Kidney failure Sister   . Stroke Sister   . Ulcers Mother   . Diabetes Mother   . Heart attack Mother   . Stroke Mother   . Asthma Mother   . Heart disease Mother   . Cervical cancer Mother   . Heart attack Brother   . Heart disease Brother   . Asthma Sister   . Diabetes Brother   . Stroke Maternal Grandmother   . Heart attack Maternal Grandmother   . Heart attack Other   . Early death Father        MVA in his 74s    Social History:  reports that she quit smoking about 58 years ago. Her smoking use included cigarettes. She started smoking about 58 years ago. She has a 1.00 pack-year smoking history. She has never used smokeless tobacco. She reports that she does not drink alcohol or use drugs.  Allergies: No Known Allergies  Medications: Prior to Admission medications   Medication Sig Start Date End Date Taking? Authorizing Provider  albuterol (PROVENTIL) (2.5 MG/3ML) 0.083% nebulizer solution Take 2.5 mg by nebulization every 6 (six) hours as needed for wheezing or shortness of breath.  04/09/19  Yes [provider]  albuterol (VENTOLIN HFA) 108 (90 Base) MCG/ACT inhaler Inhale 2 puffs into the lungs every 6 (six) hours as needed for wheezing or shortness of breath.  04/09/19  Yes [provider]  fluconazole (DIFLUCAN) 50 MG tablet Take 50 mg by mouth daily. Started on 07/15/19 per Nurse at [facility]   Yes [provider]  mirtazapine (REMERON) 15 MG tablet Take 15 mg by mouth at bedtime.   Yes [provider]  NONFORMULARY OR COMPOUNDED ITEM May use CPaP from home at previous home settings while asleep. Every Shift Day, Evening, Night   Yes [provider]  OXYGEN Inhale 2 L into the lungs. Every Shift  Yes [provider]  allopurinol (ZYLOPRIM) 100 MG tablet Take 100 mg by mouth 2 (two) times daily.  10/09/12   [provider]  apixaban (ELIQUIS) 5 MG TABS tablet Take 1  tablet (5 mg total) by mouth 2 (two) times daily. 06/21/19   Johnson, Clanford L, MD  aspirin EC 81 MG tablet Take 81 mg by mouth daily.    [provider]  atorvastatin (LIPITOR) 40 MG tablet Take 1 tablet (40 mg total) by mouth at bedtime. 05/04/18   Satira Sark, MD  Biotin 5 MG CAPS Take 5 mg by mouth daily.    [provider]  diltiazem (CARDIZEM CD) 120 MG 24 hr capsule Take 1 capsule (120 mg total) by mouth daily. 06/22/19   Johnson, Clanford L, MD  esomeprazole (NEXIUM) 40 MG capsule TAKE 1 CAPSULE TWICE A DAY 30 MINUTES PRIOR TO MEALS 01/19/18   Annitta Needs, NP  famotidine (PEPCID) 40 MG tablet TAKE 1 TABLET AT BEDTIME AS NEEDED TO CONTROL REFLUX Patient not taking: Reported on 07/19/2019 06/23/15   Mannam, Hart Robinsons, MD  feeding supplement, GLUCERNA SHAKE, (GLUCERNA SHAKE) LIQD Take 237 mLs by mouth 2 (two) times daily between meals.    [provider]  hydrALAZINE (APRESOLINE) 25 MG tablet Take 25 mg by mouth 2 (two) times daily.    [provider]  LANTUS SOLOSTAR 100 UNIT/ML SOPN Inject 8 Units into the skin at bedtime.  10/06/12   [provider]  loperamide (IMODIUM) 2 MG capsule Take 2 mg by mouth every 6 (six) hours as needed for diarrhea or loose stools.     [provider]  metoprolol succinate (TOPROL-XL) 25 MG 24 hr tablet Take 25 mg by mouth daily. Along with 50 mg to = 75 mg    [provider]  metoprolol succinate (TOPROL-XL) 50 MG 24 hr tablet Take 50 mg by mouth daily. Along with 25 mg to = 75 mg    [provider]  potassium chloride SA (KLOR-CON) 10 MEQ tablet Take 1 tablet (10 mEq total) by mouth every other day. 07/08/19   Johnson, Clanford L, MD  sertraline (ZOLOFT) 50 MG tablet Take 50 mg by mouth daily.    [provider]  tamsulosin (FLOMAX) 0.4 MG CAPS capsule Take 1 capsule (0.4 mg total) by mouth daily. 07/08/19   Johnson, Clanford L, MD  torsemide (DEMADEX) 10 MG tablet Take 5  tablets (50 mg total) by mouth daily. 07/09/19   Johnson, Clanford L, MD  TRELEGY ELLIPTA 100-62.5-25 MCG/INH AEPB Inhale 1 puff into the lungs daily. 05/13/19   [provider]  vitamin C (ASCORBIC ACID) 500 MG tablet Take 500 mg by mouth daily. 07/15/19 07/28/19  [provider]  zinc sulfate 220 (50 Zn) MG capsule Take 220 mg by mouth daily. 07/15/19 07/28/19  [provider]    I have reviewed the patient's current medications.  Labs:  Results for orders placed or performed during the hospital encounter of 07/17/19 (from the past 48 hour(s))  Glucose, capillary     Status: Abnormal   Collection Time: 07/20/19 11:21 AM  Result Value Ref Range   Glucose-Capillary 105 (H) 70 - 99 mg/dL  Glucose, capillary     Status: Abnormal   Collection Time: 07/20/19  4:36 PM  Result Value Ref Range   Glucose-Capillary 121 (H) 70 - 99 mg/dL  Glucose, capillary     Status: Abnormal   Collection Time: 07/20/19  9:09 PM  Result Value Ref Range   Glucose-Capillary 107 (H) 70 - 99 mg/dL   Comment 1 Notify RN    Comment 2 Document in Chart   Glucose, capillary     Status: Abnormal   Collection Time: 07/21/19  8:01 AM  Result Value Ref Range   Glucose-Capillary 107 (H) 70 - 99 mg/dL  Glucose, capillary     Status: Abnormal   Collection Time: 07/21/19 11:38 AM  Result Value Ref Range   Glucose-Capillary 138 (H) 70 - 99 mg/dL  Glucose, capillary     Status: Abnormal   Collection Time: 07/21/19  4:40 PM  Result Value Ref Range   Glucose-Capillary 105 (H) 70 - 99 mg/dL  Glucose, capillary     Status: Abnormal   Collection Time: 07/21/19  9:25 PM  Result Value Ref Range   Glucose-Capillary 111 (H) 70 - 99 mg/dL  Basic metabolic panel     Status: Abnormal   Collection Time: 07/22/19  3:50 AM  Result Value Ref Range   Sodium 139 135 - 145 mmol/L   Potassium 4.1 3.5 - 5.1 mmol/L   Chloride 93 (L) 98 - 111 mmol/L   CO2 33 (H) 22 - 32 mmol/L   Glucose, Bld 113 (H) 70 - 99  mg/dL   BUN 98 (H) 8 - 23 mg/dL   Creatinine, Ser 5.55 (H) 0.44 - 1.00 mg/dL    Comment: DELTA CHECK NOTED   Calcium 8.3 (L) 8.9 - 10.3 mg/dL   GFR calc non Af Amer 7 (L) >60 mL/min   GFR calc Af Amer 8 (L) >60 mL/min   Anion gap 13 5 - 15    Comment: Performed at [facility], [address], [place], [place] [address]  Magnesium-AM     Status: Abnormal   Collection Time: 07/22/19  3:50 AM  Result Value Ref Range   Magnesium 1.6 (L) 1.7 - 2.4 mg/dL    Comment: Performed at [facility], [address], [place], [place] [address]  CBC     Status: Abnormal   Collection Time: 07/22/19  3:50 AM  Result Value Ref Range   WBC 7.0 4.0 - 10.5 K/uL   RBC 2.62 (L) 3.87 - 5.11 MIL/uL   Hemoglobin 7.3 (L) 12.0 - 15.0 g/dL   HCT 24.0 (L) 36.0 - 46.0 %   MCV 91.6 80.0 - 100.0 fL   MCH 27.9 26.0 - 34.0 pg   MCHC 30.4 30.0 - 36.0 g/dL   RDW 16.9 (H) 11.5 - 15.5 %   Platelets 286 150 - 400 K/uL   nRBC 0.0 0.0 - 0.2 %    Comment: Performed at [facility], [address] [place], [place] [address]  Glucose, capillary     Status: Abnormal   Collection Time: 07/22/19  7:43 AM  Result Value Ref Range   Glucose-Capillary 108 (H) 70 - 99 mg/dL     ROS:  A comprehensive review of systems was negative.  She denies any further nausea or vomiting  Physical Exam: Vitals:   07/22/19 0846 07/22/19 0954  BP:  103/83  Pulse:  (!) 134  Resp:  14  Temp: 98 F (36.7 C)   SpO2:  100%     General: Alert, pleasant white female in no acute distress.  She is obese HEENT: Pupils are equal round and reactive to light, extraocular motions are intact, mucous membranes are moist Neck: No JVD Heart: Irregular Lungs: Mostly clear Abdomen: Obese, soft, nontender.  No palpable bladder Extremities:  Obese but no edema Skin: Warm and dry Neuro: Alert and nonfocal  Assessment/Plan: 73 year old white female with many medical issues including CKD.  She has developed acute on chronic renal failure in the  hospital 1.Renal-acute on chronic renal failure while in the hospital.  Initial urinalysis looks like UTI with Enterobacter that has not been treated.  I will start her on Rocephin.  I will recheck urinalysis.  I have ordered bladder scans as bladder outlet obstruction was an issue in the past.  However, I think the most likely scenario is that her acute on chronic renal failure is hemodynamic in the setting of hypotension due to atrial fibrillation and medications to control rate.  Her volume status actually seems OK, she may be a little bit dry.  Therefore, I will start some IV fluids as well.  There are no absolute indications for dialysis but I do not have a great feeling here.  I hope that we can get this to turn around.  I feel like she would do dialysis if she needed it but I am concerned about her mental state if it comes to that 2. Hypertension/volume  -excellent possibly seems dry to me.  I am going to start some low rate IV fluids 3. Anemia  -significant anemia-but also felt to have GI bleed.  Will check iron stores and give ESA since in the setting of the acute kidney injury 4.  UTI- will start ceftriaxone and recheck U/A   Louis Meckel 07/22/2019, 10:15 AM

## 2019-07-22 NOTE — Progress Notes (Addendum)
PROGRESS NOTE  [name] EYC:[id number] DOB: March 01, 1946 DOA: 07/17/2019 PCP: [name], MD  Brief History:  73 y.o.femalewith past medical history significant for diastolic heart failure, chronic kidney disease stage 4; COPD, chronic respiratory failure with hypoxia, obesity, obstructive sleep apnea, atrial flutter/atrial fibrillation (chronically on anticoagulation), gastroesophageal reflux disease/esophagitis, hiatal hernia, gout, type 2 diabetes with nephropathy and chronic anemia of CKD; who presented to the hospital secondary to abdominal pain, nausea, vomiting and coffee-ground emesis. Patient currently in nursing facility for rehabilitation purposes after recent admission due to acute on chronic heart failure exacerbation and acute on chronic CKD4. Apparently patient complains of sudden episode of nausea/vomiting with subsequent coffee-ground emesis x2 and nursing home. Associated symptoms included abdominal pain, nonradiating, mid epigastric region, 5out of 10 in intensity.All hersymptoms present on the day of admission. Patient chronically on anticoagulation and compliant with medications prior to admission. GI was consulted to assist.  She underwent EGD on 07/19/19 showingmild erosive reflux esophagitis,Mild Schatzki rings/p dilated,Large hiatal hernia,Erythematous gastric mucosa, andNormal duodenal bulb and second portion of the duodenum  In the ED patient's hemoglobin was found to be slightly lower than baseline (at 7.8), renal function higher than from recent discharge at 3.76 and a BUN of 97. No further episode of vomiting since she was in the emergency department. Antiemetics have been provided. TRH call to place patient in observation for further evaluation and management of coffee-ground emesis. Urinalysis demonstrated pyuria but no other symptoms suggesting acute UTI. Patient wastype and screen, gentle fluid resuscitation given and  started on PPI.  During evening of 12/14-12/15, she developed atrial fib with RVR.  She was started on amiodarone drip due to soft BPs.  Cardiology was consulted to assist. During the hospitalization, her serum creatinine worsened again.  Nephrology was consulted  To assist.  Assessment/Plan: Upper GI Bleed/Hematemesis -due NSAIDS and erosive esophagitis -EGD on 07/19/19 withMild erosive reflux esophagitis,Mild Schatzki rings/p dilated,Large hiatal hernia,Erythematous gastric mucosa, andNormal duodenal bulb and second portion of the duodenum.  --avoid Aleve and all NSAIDs, continue PPI BID -resume apixaban on 07/25/19 per GI -Upper GI bleed likely precipitated by NSAIDs and chronic anticoagulation -Hgb appears to be dropping again--continue to trend  Atrial Fibrillation with RVR, type unspecified -hx of atypical atrial flutter -appreciate cardiology followup  -rate control difficult due to soft BPs -continue amiodarone drip -d/c hydralazine to allow BP margin for titration of meds -decreased metoprolol (succinate 75 mg to tartrate 25 mg bid) -holding apixaban till 07/25/19 if Hgb remains stable  Chronic Diastolic CHF -holding torsemide initially due to acute on chronic renal failure -Patient was previously taking torsemide 100 mg daily, and was decreased to 50 mg at the time of her last hospital discharge with instructions to titrate back up to home dose as tolerated -am BMP -daily weights -06/16/19 echo EF 55-60%, G2DD, mild MR, trace TR, PASP 48  Acute on chronic renal failure--CKD stage IV -Baseline creatinine 2.8-3.1 -due to hemodynamic changes  -Patient was previously taking torsemide 100 mg daily, and was decreased to 50 mg at the time of her last hospital discharge with instructions to titrate back up to home dose as tolerated -reconsult renal--spoke with Dr. Moshe Cipro -A.m. BMP  Nonobstructive CAD by history.  -High-sensitivity troponin I levels are not  suggestive of ACS.  Chronic respiratory failure with hypoxia -Patient is normally on 2 L at nighttime and as needed during the day  Type 2 diabetes with nephropathy (  [place]) -Continue sliding scale insulin adjusted dose of Lantus nightly -07/05/19 A1C--6.8  Obesity, Class III, BMI 40-49.9 (morbid obesity) ([place]) -Body mass index is 36.33 kg/m. -Lifestyle modification  COPD (chronic obstructive pulmonary disease) ([facility]) -No signs of acute exacerbation -Continue current oxygen supplementation -Continue home inhaler regimen.  Essential hypertension -BP remains soft -Continue metoprolol succinate -diltiazem on hold due to soft BPs  Depression/anxiety -No suicidal ideation or hallucination -Continue Zoloft.  Gouty arthritis -Continue allopurinol        Disposition Plan:   Home in 2-3 days  Family Communication:  No Family at bedside  Consultants:  GI, cardiology, nephrology  Code Status:  FULL   DVT Prophylaxis:  SCDs   Procedures: As Listed in Progress Note Above  Antibiotics: None   Total time spent 35 minutes.  Greater than 50% spent face to face counseling and coordinating care.    Subjective: Patient denies fevers, chills, headache, chest pain, dyspnea, nausea, vomiting, diarrhea, abdominal pain, dysuria, hematuria, hematochezia, and melena.   Objective: Vitals:   07/22/19 0600 07/22/19 0700 07/22/19 0800 07/22/19 0846  BP: (!) 106/43 106/66    Pulse: (!) 130 93 61   Resp: 20 18 18    Temp:    98 F (36.7 C)  TempSrc:    Oral  SpO2: 98% 97% 100%   Weight:      Height:        Intake/Output Summary (Last 24 hours) at 07/22/2019 0943 Last data filed at 07/22/2019 0314 Gross per 24 hour  Intake 404.93 ml  Output --  Net 404.93 ml   Weight change: 1.6 kg Exam:   General:  Pt is alert, follows commands appropriately, not in acute distress  HEENT: No icterus, No thrush, No neck mass, [place]/AT  Cardiovascular: RRR,  S1/S2, no rubs, no gallops  Respiratory: bibasilar rales. No wheeze  Abdomen: Soft/+BS, non tender, non distended, no guarding  Extremities: trace LE edema, No lymphangitis, No petechiae, No rashes, no synovitis   Data Reviewed: I have personally reviewed following labs and imaging studies Basic Metabolic Panel: Recent Labs  Lab 07/16/19 0430 07/17/19 0940 07/18/19 0803 07/20/19 0722 07/22/19 0350  NA 140 139 143 143 139  K 4.4 4.4 4.2 4.1 4.1  CL 92* 92* 99 96* 93*  CO2 33* 31 34* 33* 33*  GLUCOSE 121* 142* 111* 148* 113*  BUN 96* 97* 94* 87* 98*  CREATININE 3.75* 3.76* 3.36* 3.37* 5.55*  CALCIUM 7.8* 7.7* 8.0* 8.6* 8.3*  MG  --   --   --   --  1.6*   Liver Function Tests: Recent Labs  Lab 07/17/19 0940 07/20/19 0722  AST 12* 14*  ALT 10 9  ALKPHOS 61 64  BILITOT 1.2 1.3*  PROT 6.7 7.0  ALBUMIN 2.3* 2.3*   Recent Labs  Lab 07/17/19 0940  LIPASE 33   No results for input(s): AMMONIA in the last 168 hours. Coagulation Profile: No results for input(s): INR, PROTIME in the last 168 hours. CBC: Recent Labs  Lab 07/17/19 0940 07/18/19 0803 07/20/19 0722 07/22/19 0350  WBC 11.1* 7.9 8.1 7.0  NEUTROABS 9.6*  --   --   --   HGB 7.8* 8.1* 8.6* 7.3*  HCT 24.8* 26.5* 28.6* 24.0*  MCV 88.6 90.1 92.0 91.6  PLT 317 285 318 286   Cardiac Enzymes: No results for input(s): CKTOTAL, CKMB, CKMBINDEX, TROPONINI in the last 168 hours. BNP: Invalid input(s): POCBNP CBG: Recent Labs  Lab 07/21/19 0801 07/21/19 1138 07/21/19 1640  07/21/19 2125 07/22/19 0743  GLUCAP 107* 138* 105* 111* 108*   HbA1C: No results for input(s): HGBA1C in the last 72 hours. Urine analysis:    Component Value Date/Time   COLORURINE YELLOW 07/17/2019 0940   APPEARANCEUR TURBID (A) 07/17/2019 0940   LABSPEC 1.011 07/17/2019 0940   PHURINE 7.0 07/17/2019 0940   GLUCOSEU NEGATIVE 07/17/2019 0940   HGBUR SMALL (A) 07/17/2019 0940   BILIRUBINUR NEGATIVE 07/17/2019 0940   KETONESUR  5 (A) 07/17/2019 0940   PROTEINUR 100 (A) 07/17/2019 0940   UROBILINOGEN 0.2 01/29/2007 1500   NITRITE NEGATIVE 07/17/2019 0940   LEUKOCYTESUR LARGE (A) 07/17/2019 0940   Sepsis Labs: @LABRCNTIP (procalcitonin:4,lacticidven:4) ) Recent Results (from the past 240 hour(s))  Urine culture     Status: Abnormal   Collection Time: 07/17/19  9:40 AM   Specimen: Urine, Clean Catch  Result Value Ref Range Status   Specimen Description   Final    URINE, CLEAN CATCH Performed at [facility], [address] [place], [place] [address]    Special Requests   Final    NONE Performed at [facility], [address], [place], [place] [address]    Culture >=100,000 COLONIES/mL ENTEROBACTER AEROGENES (A)  Final   Report Status 07/20/2019 FINAL  Final   Organism ID, Bacteria ENTEROBACTER AEROGENES (A)  Final      Susceptibility   Enterobacter aerogenes - MIC*    CEFAZOLIN >=64 RESISTANT Resistant     CEFTRIAXONE <=1 SENSITIVE Sensitive     CIPROFLOXACIN <=0.25 SENSITIVE Sensitive     GENTAMICIN <=1 SENSITIVE Sensitive     IMIPENEM 1 SENSITIVE Sensitive     NITROFURANTOIN 64 INTERMEDIATE Intermediate     TRIMETH/SULFA <=20 SENSITIVE Sensitive     PIP/TAZO <=4 SENSITIVE Sensitive     * >=100,000 COLONIES/mL ENTEROBACTER AEROGENES  SARS CORONAVIRUS 2 (Kyndle Schlender 6-24 HRS) Nasopharyngeal Nasopharyngeal Swab     Status: None   Collection Time: 07/17/19  2:31 PM   Specimen: Nasopharyngeal Swab  Result Value Ref Range Status   SARS Coronavirus 2 NEGATIVE NEGATIVE Final    Comment: (NOTE) SARS-CoV-2 target nucleic acids are NOT DETECTED. The SARS-CoV-2 RNA is generally detectable in upper and lower respiratory specimens during the acute phase of infection. Negative results do not preclude SARS-CoV-2 infection, do not rule out co-infections with other pathogens, and should not be used as the sole basis for treatment or other patient management decisions. Negative results must be combined with clinical  observations, patient history, and epidemiological information. The expected result is Negative. Fact Sheet for Patients: SugarRoll.be Fact Sheet for Healthcare Providers: https://www.woods-mathews.com/ This test is not yet approved or cleared by the [place] FDA and  has been authorized for detection and/or diagnosis of SARS-CoV-2 by FDA under an Emergency Use Authorization (EUA). This EUA will remain  in effect (meaning this test can be used) for the duration of the COVID-19 declaration under Section 56 4(b)(1) of the Act, 21 U.S.C. section 360bbb-3(b)(1), unless the authorization is terminated or revoked sooner. Performed at [facility], [place] [address] [place], [place] [address]   MRSA PCR Screening     Status: None   Collection Time: 07/17/19 10:00 PM   Specimen: Nasal Mucosa; Nasopharyngeal  Result Value Ref Range Status   MRSA by PCR NEGATIVE NEGATIVE Final    Comment:        The GeneXpert MRSA Assay (FDA approved for NASAL specimens only), is one component of a comprehensive MRSA colonization surveillance program. It  is not intended to diagnose MRSA infection nor to guide or monitor treatment for MRSA infections. Performed at [facility], [address], [place], [place] [address]      Scheduled Meds: . allopurinol  100 mg Oral BID  . atorvastatin  40 mg Oral QHS  . Chlorhexidine Gluconate Cloth  6 each Topical Daily  . famotidine  20 mg Oral QHS  . umeclidinium bromide  1 puff Inhalation Daily   And  . fluticasone furoate-vilanterol  1 puff Inhalation Daily  . insulin aspart  0-9 Units Subcutaneous TID WC  . insulin glargine  5 Units Subcutaneous QHS  . metoprolol tartrate  25 mg Oral BID  . pantoprazole (PROTONIX) IV  40 mg Intravenous Q12H  . sertraline  50 mg Oral Daily  . sodium chloride flush  3 mL Intravenous Q12H  . tamsulosin  0.4 mg Oral Daily   Continuous Infusions: . sodium chloride    .  amiodarone 30 mg/hr (07/22/19 0116)  . diltiazem (CARDIZEM) infusion Stopped (07/20/19 1028)    Procedures/Studies: CT ABDOMEN PELVIS WO CONTRAST  Result Date: 07/17/2019 CLINICAL DATA:  Abdominal pain, nausea and vomiting EXAM: CT ABDOMEN AND PELVIS WITHOUT CONTRAST TECHNIQUE: Multidetector CT imaging of the abdomen and pelvis was performed following the standard protocol without IV contrast. COMPARISON:  None. FINDINGS: Lower chest: Increased bibasilar bandlike opacities consistent with atelectasis. Right middle lobe indeterminate small nodules noted, series 4, images 3 and 7. These measure 5 mm. Stable cardiomegaly. No pericardial or pleural effusion. Large hiatal hernia again noted with the majority of the stomach in the chest. Hepatobiliary: Limited without IV contrast. Remote cholecystectomy. No intrahepatic biliary dilatation or large focal hepatic abnormality. Pancreas: Unremarkable. No pancreatic ductal dilatation or surrounding inflammatory changes. Spleen: Normal in size without focal abnormality. Adrenals/Urinary Tract: Normal adrenal glands. Kidneys demonstrate mild chronic perinephric strandy edema. Renal obstruction or hydronephrosis. No hydroureter or obstructing ureteral calculus adjacent chronic calcified gonadal vein phleboliths noted on the left. Urinary bladder unremarkable. Stomach/Bowel: Large hiatal hernia again noted. Majority of stomach in the chest. Negative for obstruction, significant dilatation, ileus, or free air. Extensive colonic diverticulosis without acute focal inflammatory process or wall thickening. No fluid collection, hemorrhage, hematoma, abscess or ascites. Vascular/Lymphatic: Abdominal atherosclerosis noted. Negative for aneurysm. No retroperitoneal hemorrhage or hematoma. No bulky adenopathy. Reproductive: Status post hysterectomy. No adnexal masses. Other: No abdominal wall hernia or abnormality. No abdominopelvic ascites. Musculoskeletal: Remote lumbar spinal  fusion. No acute osseous finding. Improved body anasarca compared to the prior study. IMPRESSION: Stable large hiatal hernia with majority of the stomach in the chest. Increased bibasilar atelectasis Right middle lobe 5 mm pulmonary nodules remain indeterminate. No follow-up needed if patient is low-risk. Non-contrast chest CT can be considered in 12 months if patient is high-risk. This recommendation follows the consensus statement: Guidelines for Management of Incidental Pulmonary Nodules Detected on CT Images: From the Fleischner Society 2017; Radiology 2017; 284:228-243. Diverticulosis without acute inflammatory process Improved body anasarca Electronically Signed   By: Jerilynn Mages.  Shick M.D.   On: 07/17/2019 13:06   DG Chest 2 View  Result Date: 07/05/2019 CLINICAL DATA:  Congestion and shortness of breath EXAM: CHEST - 2 VIEW COMPARISON:  Chest radiograph July 04, 2019 FINDINGS: Port-A-Cath tip is in the superior vena cava. No pneumothorax. There is mild interstitial prominence with suspected mild interstitial edema. No consolidation. Heart is mildly enlarged with pulmonary venous hypertension. No evident adenopathy. 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 There is degenerative change in the  thoracic spine. IMPRESSION: There is a degree of pulmonary vascular congestion with mild interstitial edema. No consolidation. There may be a degree of congestive heart failure. Sizable paraesophageal hernia noted. Port-A-Cath tip in superior vena cava. Electronically Signed   By: Lowella Grip III M.D.   On: 07/05/2019 10:52   CT Head Wo Contrast  Result Date: 07/05/2019 CLINICAL DATA:  Unexplained altered level of consciousness. EXAM: CT HEAD WITHOUT CONTRAST TECHNIQUE: Contiguous axial images were obtained from the base of the skull through the vertex without intravenous contrast. COMPARISON:  06/27/2015 FINDINGS: Brain: Age related atrophy. Mild chronic small-vessel change of the hemispheric white matter and  pons. 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 No sign of mass lesion, hemorrhage, hydrocephalus or extra-axial collection. Vascular: There is atherosclerotic calcification of the major vessels at the base of the brain. Skull: Negative Sinuses/Orbits: Paranasal sinuses are clear. Orbits are negative. Mastoid effusion on the right. Other: None IMPRESSION: No acute intracranial finding. Atrophy and chronic small-vessel ischemic changes as seen previously. Electronically Signed   By: Nelson Chimes M.D.   On: 07/05/2019 11:04   US RENAL  Result Date: 07/06/2019 CLINICAL DATA:  Acute kidney injury. EXAM: RENAL / URINARY TRACT ULTRASOUND COMPLETE COMPARISON:  Noncontrast abdominal CT 05/31/2019, renal ultrasound 09/10/2017 FINDINGS: Right Kidney: Renal measurements: 8.9 x 5.6 x 6.1 cm = volume: 160 mL. No hydronephrosis. There is renal cortical thinning with increased parenchymal echogenicity. Mild scarring. Simple cyst measuring 1.2 cm in the upper kidney again seen. No suspicious solid lesion. No shadowing calculi. Left Kidney: Renal measurements: 9.0 x 5.5 x 5.0 cm = volume: 132 mL. No hydronephrosis. There is thinning of renal parenchyma with increased renal echogenicity. Bladder: No bladder wall thickening. Both ureteral jets are visualized. Reported post void residual of 284 cc. Prevoid bladder volume not recorded. Other: None. IMPRESSION: 1. No hydronephrosis. 2. Thinning of bilateral renal parenchyma with increased renal echogenicity most consistent with chronic medical renal disease. 3. Post void residual of 284 cc. Electronically Signed   By: Keith Rake M.D.   On: 07/06/2019 12:45   DG Chest Portable 1 View  Result Date: 07/17/2019 CLINICAL DATA:  Vomiting, abdominal pain. EXAM: PORTABLE CHEST 1 VIEW COMPARISON:  July 05, 2019. FINDINGS: Stable cardiomediastinal silhouette. Right internal jugular Port-A-Cath is unchanged. No pneumothorax or pleural effusion is noted. No acute  pulmonary disease is noted. Bony thorax is unremarkable. Stable hiatal hernia. IMPRESSION: No acute cardiopulmonary abnormality seen.  Stable hiatal hernia. Electronically Signed   By: Marijo Conception M.D.   On: 07/17/2019 10:53   DG Chest Portable 1 View  Result Date: 07/04/2019 CLINICAL DATA:  Altered mental status. Recent positive blood culture EXAM: PORTABLE CHEST 1 VIEW COMPARISON:  June 15, 2019 FINDINGS: Port-A-Cath tip is in the superior vena cava. No pneumothorax. There is no appreciable edema or consolidation. There is slight atelectatic change in the left lower lobe. Heart is upper normal in size with pulmonary vascularity normal. No adenopathy. 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 No bone lesions. IMPRESSION: Large paraesophageal hernia. Mild left base atelectasis. No edema or consolidation. Stable cardiac silhouette. Port-A-Cath tip in superior vena cava. Electronically Signed   By: Lowella Grip III M.D.   On: 07/04/2019 11:26   DG Abd Portable 1 View  Result Date: 07/17/2019 CLINICAL DATA:  Weakness, vomiting. EXAM: PORTABLE ABDOMEN - 1 VIEW COMPARISON:  September 03, 2010. FINDINGS: The bowel gas pattern is normal. No radio-opaque calculi or other significant radiographic abnormality  are seen. IMPRESSION: Negative. Electronically Signed   By: Marijo Conception M.D.   On: 07/17/2019 10:53    Orson Eva, DO  [facility] Pager [phone]  If 7PM-7AM, please contact night-coverage [email or web address] Password [email or web address] 07/22/2019, 9:43 AM   LOS: 4 days

## 2019-07-22 NOTE — Progress Notes (Signed)
Progress Note  Patient Name: [name] Date of Encounter: 07/22/2019  Primary Cardiologist: [name], MD  Subjective   No palpitations or chest pain this morning.  Appetite stable.  No abdominal pain.  Inpatient Medications    Scheduled Meds: . allopurinol  100 mg Oral BID  . atorvastatin  40 mg Oral QHS  . Chlorhexidine Gluconate Cloth  6 each Topical Daily  . famotidine  20 mg Oral QHS  . umeclidinium bromide  1 puff Inhalation Daily   And  . fluticasone furoate-vilanterol  1 puff Inhalation Daily  . insulin aspart  0-9 Units Subcutaneous TID WC  . insulin glargine  5 Units Subcutaneous QHS  . metoprolol tartrate  25 mg Oral BID  . pantoprazole (PROTONIX) IV  40 mg Intravenous Q12H  . sertraline  50 mg Oral Daily  . sodium chloride flush  3 mL Intravenous Q12H  . tamsulosin  0.4 mg Oral Daily   Continuous Infusions: . sodium chloride    . amiodarone 30 mg/hr (07/22/19 0116)  . diltiazem (CARDIZEM) infusion Stopped (07/20/19 1028)   PRN Meds: sodium chloride, acetaminophen **OR** acetaminophen, albuterol, sodium chloride flush   Vital Signs    Vitals:   07/22/19 0600 07/22/19 0700 07/22/19 0800 07/22/19 0846  BP: (!) 106/43 106/66    Pulse: (!) 130 93 61   Resp: 20 18 18    Temp:    98 F (36.7 C)  TempSrc:    Oral  SpO2: 98% 97% 100%   Weight:      Height:        Intake/Output Summary (Last 24 hours) at 07/22/2019 0900 Last data filed at 07/22/2019 0314 Gross per 24 hour  Intake 404.93 ml  Output -  Net 404.93 ml   Filed Weights   07/20/19 0500 07/21/19 0500 07/22/19 0500  Weight: 81.6 kg 81.5 kg 83.1 kg    Telemetry    Atrial fibrillation versus atypical atrial flutter with RVR although generally improved heart rate last 48 hours.  Personally reviewed.  ECG    An ECG dated 07/20/2019 was personally reviewed today and demonstrated:  Atrial fibrillation versus atypical atrial flutter with RVR.  Physical Exam   GEN:  Elderly  woman.  No acute distress.   Neck: No JVD. Cardiac:  Irregularly irregular, no gallop.  Respiratory:  Decreased breath sounds without wheezing. GI: Soft, nontender, bowel sounds present. MS: No edema; No deformity. Neuro:  Nonfocal. Psych: Alert and oriented x 3. Normal affect.  Labs    Chemistry Recent Labs  Lab 07/17/19 0940 07/18/19 0803 07/20/19 0722 07/22/19 0350  NA 139 143 143 139  K 4.4 4.2 4.1 4.1  CL 92* 99 96* 93*  CO2 31 34* 33* 33*  GLUCOSE 142* 111* 148* 113*  BUN 97* 94* 87* 98*  CREATININE 3.76* 3.36* 3.37* 5.55*  CALCIUM 7.7* 8.0* 8.6* 8.3*  PROT 6.7  --  7.0  --   ALBUMIN 2.3*  --  2.3*  --   AST 12*  --  14*  --   ALT 10  --  9  --   ALKPHOS 61  --  64  --   BILITOT 1.2  --  1.3*  --   GFRNONAA 11* 13* 13* 7*  GFRAA 13* 15* 15* 8*  ANIONGAP 16* 10 14 13      Hematology Recent Labs  Lab 07/18/19 0803 07/20/19 0722 07/22/19 0350  WBC 7.9 8.1 7.0  RBC 2.94* 3.11* 2.62*  HGB 8.1*  8.6* 7.3*  HCT 26.5* 28.6* 24.0*  MCV 90.1 92.0 91.6  MCH 27.6 27.7 27.9  MCHC 30.6 30.1 30.4  RDW 17.2* 17.6* 16.9*  PLT 285 318 286    Cardiac Enzymes Recent Labs  Lab 07/05/19 1753 07/05/19 1955 07/05/19 2157 07/17/19 0940 07/17/19 1042  TROPONINIHS 52* 50* 51* 19* 20*    Radiology    No results found.  Cardiac Studies   Echocardiogram 06/16/2019:  1. Left ventricular ejection fraction, by visual estimation, is 55 to 60%. The left ventricle has normal function. There is no left ventricular hypertrophy.  2. Elevated left atrial pressure.  3. Left ventricular diastolic parameters are consistent with Grade II diastolic dysfunction (pseudonormalization).  4. Global right ventricle has normal systolic function.The right ventricular size is normal. No increase in right ventricular wall thickness.  5. Left atrial size was moderately dilated.  6. Right atrial size was normal.  7. The mitral valve was not well visualized. Trace mitral valve regurgitation.  No evidence of mitral stenosis.  8. The tricuspid valve is normal in structure. Tricuspid valve regurgitation is mild.  9. The aortic valve was not well visualized. Aortic valve regurgitation is not visualized. No evidence of aortic valve sclerosis or stenosis. 10. The pulmonic valve was not well visualized. Pulmonic valve regurgitation not assessed. 11. The aortic root was not well visualized. 12. Moderately elevated pulmonary artery systolic pressure. 13. Moderate pulmonary HTN, PASP is 48 mmHg. 14. The inferior vena cava is normal in size with greater than 50% respiratory variability, suggesting right atrial pressure of 3 mmHg. 15. The interatrial septum was not well visualized.  Patient Profile     73 y.o. female with past medical history of chronic diastolic CHF, CAD (nonobstructive CAD by cath in 2000), HTN, HLD, Stage 3 CKD , COPD, and OSA (on CPAP) presenting to the hospital after recent coffee-ground emesis with EGD showing mild erosive esophagitis and Schatzki ring requiring dilatation.  She subsequently developed atypical atrial flutter with RVR requiring further management.  Assessment & Plan    1.  Atrial fibrillation versus atypical atrial flutter with RVR.  Letter written and oral Lopressor.  Heart rates generally better but blood pressure has limited substantial up titration of AV nodal blockers.  IV Cardizem already discontinued.  2.  Chronic diastolic heart failure.  LVEF 55 to 60% and RV function normal.  Not on diuretics at this time.  Intake and output incomplete.  3.  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  Current creatinine up to 5.55.  4.  Recent GI bleed with associated anemia.  EGD on December 14 showed mild erosive esophagitis and Schatzki ring requiring dilatation.  Hemoglobin 7.3 down from 8.6.  She remains off anticoagulation.  5.  OSA on CPAP.  6.  Nonobstructive CAD by history.  High-sensitivity troponin I levels are not suggestive of  ACS.  Difficult situation overall.  She remains in atrial fibrillation/atypical atrial flutter and although heart rate has come down, it is still not optimal.  Blood pressure is limiting titration of AV nodal blockers.  She is also no longer anticoagulated with recent GI bleed and therefore proceeding to electrical cardioversion now would not be recommended.  Hemoglobin has decreased and she has had worsening renal failure as well.  Suggest nephrology consultation.  Continue IV amiodarone and oral Lopressor.  If she can get to the point that we could start back on anticoagulation, TEE cardioversion may ultimately be considered.  Signed, Mikeal Hawthorne  Domenic Polite, MD  07/22/2019, 9:00 AM

## 2019-07-23 DIAGNOSIS — J449 Chronic obstructive pulmonary disease, unspecified: Secondary | ICD-10-CM

## 2019-07-23 DIAGNOSIS — I5032 Chronic diastolic (congestive) heart failure: Secondary | ICD-10-CM

## 2019-07-23 DIAGNOSIS — N179 Acute kidney failure, unspecified: Secondary | ICD-10-CM

## 2019-07-23 DIAGNOSIS — Z7189 Other specified counseling: Secondary | ICD-10-CM

## 2019-07-23 DIAGNOSIS — E1121 Type 2 diabetes mellitus with diabetic nephropathy: Secondary | ICD-10-CM

## 2019-07-23 DIAGNOSIS — K449 Diaphragmatic hernia without obstruction or gangrene: Secondary | ICD-10-CM

## 2019-07-23 DIAGNOSIS — K2901 Acute gastritis with bleeding: Secondary | ICD-10-CM

## 2019-07-23 DIAGNOSIS — K2100 Gastro-esophageal reflux disease with esophagitis, without bleeding: Secondary | ICD-10-CM

## 2019-07-23 DIAGNOSIS — N184 Chronic kidney disease, stage 4 (severe): Secondary | ICD-10-CM

## 2019-07-23 DIAGNOSIS — N170 Acute kidney failure with tubular necrosis: Secondary | ICD-10-CM

## 2019-07-23 LAB — CBC
HCT: 22.6 % — ABNORMAL LOW (ref 36.0–46.0)
Hemoglobin: 6.9 g/dL — CL (ref 12.0–15.0)
MCH: 27.9 pg (ref 26.0–34.0)
MCHC: 30.5 g/dL (ref 30.0–36.0)
MCV: 91.5 fL (ref 80.0–100.0)
Platelets: 244 10*3/uL (ref 150–400)
RBC: 2.47 MIL/uL — ABNORMAL LOW (ref 3.87–5.11)
RDW: 16.6 % — ABNORMAL HIGH (ref 11.5–15.5)
WBC: 6.7 10*3/uL (ref 4.0–10.5)
nRBC: 0 % (ref 0.0–0.2)

## 2019-07-23 LAB — RENAL FUNCTION PANEL
Albumin: 1.9 g/dL — ABNORMAL LOW (ref 3.5–5.0)
Anion gap: 14 (ref 5–15)
BUN: 105 mg/dL — ABNORMAL HIGH (ref 8–23)
CO2: 30 mmol/L (ref 22–32)
Calcium: 7.8 mg/dL — ABNORMAL LOW (ref 8.9–10.3)
Chloride: 92 mmol/L — ABNORMAL LOW (ref 98–111)
Creatinine, Ser: 6.41 mg/dL — ABNORMAL HIGH (ref 0.44–1.00)
GFR calc Af Amer: 7 mL/min — ABNORMAL LOW (ref >60)
GFR calc non Af Amer: 6 mL/min — ABNORMAL LOW (ref >60)
Glucose, Bld: 112 mg/dL — ABNORMAL HIGH (ref 70–99)
Phosphorus: 5.6 mg/dL — ABNORMAL HIGH (ref 2.5–4.6)
Potassium: 4.5 mmol/L (ref 3.5–5.1)
Sodium: 136 mmol/L (ref 135–145)

## 2019-07-23 LAB — HEMOGLOBIN AND HEMATOCRIT, BLOOD
HCT: 30.4 % — ABNORMAL LOW (ref 36.0–46.0)
Hemoglobin: 9.4 g/dL — ABNORMAL LOW (ref 12.0–15.0)

## 2019-07-23 LAB — IRON AND TIBC
Iron: 13 ug/dL — ABNORMAL LOW (ref 28–170)
Saturation Ratios: 8 % — ABNORMAL LOW (ref 10.4–31.8)
TIBC: 153 ug/dL — ABNORMAL LOW (ref 250–450)
UIBC: 140 ug/dL

## 2019-07-23 LAB — GLUCOSE, CAPILLARY
Glucose-Capillary: 109 mg/dL — ABNORMAL HIGH (ref 70–99)
Glucose-Capillary: 147 mg/dL — ABNORMAL HIGH (ref 70–99)
Glucose-Capillary: 148 mg/dL — ABNORMAL HIGH (ref 70–99)
Glucose-Capillary: 156 mg/dL — ABNORMAL HIGH (ref 70–99)

## 2019-07-23 LAB — FERRITIN: Ferritin: 84 ng/mL (ref 11–307)

## 2019-07-23 LAB — PREPARE RBC (CROSSMATCH)

## 2019-07-23 MED ORDER — SODIUM CHLORIDE 0.9 % IV SOLN
510.0000 mg | Freq: Once | INTRAVENOUS | Status: AC
  Administered 2019-07-23: 510 mg via INTRAVENOUS
  Filled 2019-07-23: qty 17

## 2019-07-23 MED ORDER — SODIUM CHLORIDE 0.9% IV SOLUTION: Freq: Once | INTRAVENOUS | Status: DC

## 2019-07-23 NOTE — Progress Notes (Addendum)
PROGRESS NOTE  [name] YQM:[id number] DOB: 04-28-1946 DOA: 07/17/2019 PCP: [name], MD  Brief History: 73 y.o.femalewith past medical history significant for diastolic heart failure, chronic kidney disease stage 4; COPD, chronic respiratory failure with hypoxia, obesity, obstructive sleep apnea, atrial flutter/atrial fibrillation (chronically on anticoagulation), gastroesophageal reflux disease/esophagitis, hiatal hernia, gout, type 2 diabetes with nephropathy and chronic anemia of CKD; who presented to the hospital secondary to abdominal pain, nausea, vomiting and coffee-ground emesis. Patient currently in nursing facility for rehabilitation purposes after recent admission due to acute on chronic heart failure exacerbation andacute on chronic CKD4. Apparently patient complains of sudden episode of nausea/vomiting with subsequent coffee-ground emesis x2 and nursing home. Associated symptoms included abdominal pain, nonradiating, mid epigastric region, 5out of 10 in intensity.All hersymptoms present on the day of admission. Patient chronically on anticoagulation and compliant with medications prior to admission. GI was consulted to assist.  She underwent EGD on 07/19/19 showingmild erosive reflux esophagitis,Mild Schatzki rings/p dilated,Large hiatal hernia,Erythematous gastric mucosa, andNormal duodenal bulb and second portion of the duodenum  In the ED patient's hemoglobin was found to be slightly lower than baseline (at 7.8), renal function higher than from recent discharge at 3.76 and a BUN of 97. No further episode of vomiting since she was in the emergency department. Antiemetics have been provided. TRH call to place patient in observation for further evaluation and management of coffee-ground emesis. Urinalysis demonstrated pyuria but no other symptoms suggesting acute UTI. Patient wastype and screen, gentle fluid resuscitation given and  started on PPI.During evening of 12/14-12/15, she developed atrial fib with RVR. She was started on amiodarone drip due to soft BPs. Cardiology was consulted to assist. During the hospitalization, her serum creatinine worsened again.  Nephrology was consulted  To assist.  Assessment/Plan: Upper GI Bleed/Hematemesis -due NSAIDS and erosive esophagitis -EGD on 07/19/19 withMild erosive reflux esophagitis,Mild Schatzki rings/p dilated,Large hiatal hernia,Erythematous gastric mucosa, andNormal duodenal bulb and second portion of the duodenum. -avoid Aleve and all NSAIDs -continue PPI BID -resume apixaban on 07/25/19 per GI -Upper GI bleed likely precipitated by NSAIDs and chronic anticoagulation -12/17--Hgb appears to be dropping again--GI reconsulted -last BM 12/16 without hematochezia, mostly brown -discussed with GI-->may need repeat EGD -12/18--transfuse 2 units PRBC  Atrial Fibrillation with RVR, type unspecified -hx of atypical atrial flutter -appreciate cardiology followup  -rate control difficult due to soft BPs -continue amiodarone drip -d/c hydralazine to allow BP margin for titration of meds -decreased metoprolol (succinate 75 mg to tartrate 25 mg bid) -holding apixaban till 07/25/19 if Hgb remains stable  Acute on chronic renal failure--CKD stage IV -Baseline creatinine 2.8-3.1 -due to hemodynamic changes  -Patient was previously taking torsemide 100 mg daily,and was decreased to 50 mg at the time of her last hospital discharge with instructions to titrate back up to home dose as tolerated -appreciate renal consult -may need CRRT if no improvement -A.m. renal panel  Chronic Diastolic CHF -holding torsemide initially due to acute on chronic renal failure -Patient was previously taking torsemide 100 mg daily,and was decreased to 50 mg at the time of her last hospital discharge with instructions to titrate back up to home dose as tolerated -am BMP -daily  weights -06/16/19 echo EF 55-60%, G2DD,mild MR, trace TR, PASP 48  Nonobstructive CAD by history. -High-sensitivity troponin I levels are not suggestive of ACS.  Chronic respiratory failure with hypoxia -Patient is normally on 2 L at nighttime and as  needed during the day -currently stable on 2L  Type 2 diabetes with nephropathy ([place]) -Continue sliding scale insulin adjusted dose of Lantus nightly -07/05/19 A1C--6.8  Obesity, Class III, BMI 40-49.9 (morbid obesity) (HCC) -Body mass index is 36.33 kg/m. -Lifestyle modification  COPD (chronic obstructive pulmonary disease) (HCC) -No signs of acute exacerbation -Continue current oxygen supplementation -Continue home inhaler regimen.  Essential hypertension -BP remains soft -Continue metoprolol tartrate as discussed above -diltiazem on hold due to soft BPs  Depression/anxiety -No suicidal ideation or hallucination -Continue Zoloft.  Gouty arthritis -Continue allopurinol  Goals of Care -Advance care planning, including the explanation and discussion of advance directives was carried out with the patient and family.  Code status including explanations of "Full Code" and "DNR" and alternatives were discussed in detail.  Discussion of end-of-life issues including but not limited palliative care, hospice care and the concept of hospice, other end-of-life care options, power of attorney for health care decisions, living wills, and physician orders for life-sustaining treatment were also discussed with the patient and family.  Total face to face time 25 minutes. -Full scope of care for now. Patient and family will make decision on HD and code status in next 24 hours         Disposition Plan: Home in 2-3days  Family Communication:Son updated at bedside 12/18  Consultants:GI, cardiology, nephrology  Code Status: FULL   DVT Prophylaxis: SCDs   Procedures: As Listed in Progress Note  Above  Antibiotics: None   Total time spent 35 minutes. Greater than 50% spent face to face counseling and coordinating care.   Subjective: Patient denies fevers, chills, headache, chest pain, dyspnea, nausea, vomiting, diarrhea, abdominal pain, dysuria, hematuria, hematochezia, and melena.   Objective: Vitals:   07/23/19 0700 07/23/19 0800 07/23/19 0900 07/23/19 1000  BP: (!) 86/69 (!) 101/52 (!) 98/56 (!) 100/52  Pulse: (!) 126 (!) 115 (!) 142 96  Resp: 16 (!) 21 (!) 21 16  Temp:      TempSrc:      SpO2: 98% 99% 95% 95%  Weight:      Height:        Intake/Output Summary (Last 24 hours) at 07/23/2019 1143 Last data filed at 07/23/2019 1000 Gross per 24 hour  Intake 1530.31 ml  Output --  Net 1530.31 ml   Weight change: 6.6 kg Exam:   General:  Pt is alert, follows commands appropriately, not in acute distress  HEENT: No icterus, No thrush, No neck mass, [place]/AT  Cardiovascular: IRRR, S1/S2, no rubs, no gallops  Respiratory: bibasilar crackles. No wheeze  Abdomen: Soft/+BS, non tender, non distended, no guarding  Extremities: trace LE edema, No lymphangitis, No petechiae, No rashes, no synovitis   Data Reviewed: I have personally reviewed following labs and imaging studies Basic Metabolic Panel: Recent Labs  Lab 07/17/19 0940 07/18/19 0803 07/20/19 0722 07/22/19 0350 07/23/19 0351  NA 139 143 143 139 136  K 4.4 4.2 4.1 4.1 4.5  CL 92* 99 96* 93* 92*  CO2 31 34* 33* 33* 30  GLUCOSE 142* 111* 148* 113* 112*  BUN 97* 94* 87* 98* 105*  CREATININE 3.76* 3.36* 3.37* 5.55* 6.41*  CALCIUM 7.7* 8.0* 8.6* 8.3* 7.8*  MG  --   --   --  1.6*  --   PHOS  --   --   --   --  5.6*   Liver Function Tests: Recent Labs  Lab 07/17/19 0940 07/20/19 0722 07/23/19 0351  AST 12* 14*  --  ALT 10 9  --   ALKPHOS 61 64  --   BILITOT 1.2 1.3*  --   PROT 6.7 7.0  --   ALBUMIN 2.3* 2.3* 1.9*   Recent Labs  Lab 07/17/19 0940  LIPASE 33   No results for  input(s): AMMONIA in the last 168 hours. Coagulation Profile: No results for input(s): INR, PROTIME in the last 168 hours. CBC: Recent Labs  Lab 07/17/19 0940 07/18/19 0803 07/20/19 0722 07/22/19 0350 07/22/19 1143 07/23/19 0351  WBC 11.1* 7.9 8.1 7.0  --  6.7  NEUTROABS 9.6*  --   --   --   --   --   HGB 7.8* 8.1* 8.6* 7.3* 7.6* 6.9*  HCT 24.8* 26.5* 28.6* 24.0* 25.0* 22.6*  MCV 88.6 90.1 92.0 91.6  --  91.5  PLT 317 285 318 286  --  244   Cardiac Enzymes: No results for input(s): CKTOTAL, CKMB, CKMBINDEX, TROPONINI in the last 168 hours. BNP: Invalid input(s): POCBNP CBG: Recent Labs  Lab 07/22/19 1156 07/22/19 1705 07/22/19 2132 07/23/19 0713 07/23/19 1127  GLUCAP 137* 140* 95 109* 147*   HbA1C: No results for input(s): HGBA1C in the last 72 hours. Urine analysis:    Component Value Date/Time   COLORURINE YELLOW 07/22/2019 1038   APPEARANCEUR TURBID (A) 07/22/2019 1038   LABSPEC >1.030 (H) 07/22/2019 1038   PHURINE 6.0 07/22/2019 1038   GLUCOSEU NEGATIVE 07/22/2019 1038   HGBUR SMALL (A) 07/22/2019 1038   BILIRUBINUR SMALL (A) 07/22/2019 1038   KETONESUR TRACE (A) 07/22/2019 1038   PROTEINUR 100 (A) 07/22/2019 1038   UROBILINOGEN 0.2 01/29/2007 1500   NITRITE NEGATIVE 07/22/2019 1038   LEUKOCYTESUR MODERATE (A) 07/22/2019 1038   Sepsis Labs: @LABRCNTIP (procalcitonin:4,lacticidven:4) ) Recent Results (from the past 240 hour(s))  Urine culture     Status: Abnormal   Collection Time: 07/17/19  9:40 AM   Specimen: Urine, Clean Catch  Result Value Ref Range Status   Specimen Description   Final    URINE, CLEAN CATCH Performed at [facility], [address] [place], [place] [address]    Special Requests   Final    NONE Performed at [facility], [address], [place], [place] [address]    Culture >=100,000 COLONIES/mL ENTEROBACTER AEROGENES (A)  Final   Report Status 07/20/2019 FINAL  Final   Organism ID, Bacteria ENTEROBACTER AEROGENES (A)  Final       Susceptibility   Enterobacter aerogenes - MIC*    CEFAZOLIN >=64 RESISTANT Resistant     CEFTRIAXONE <=1 SENSITIVE Sensitive     CIPROFLOXACIN <=0.25 SENSITIVE Sensitive     GENTAMICIN <=1 SENSITIVE Sensitive     IMIPENEM 1 SENSITIVE Sensitive     NITROFURANTOIN 64 INTERMEDIATE Intermediate     TRIMETH/SULFA <=20 SENSITIVE Sensitive     PIP/TAZO <=4 SENSITIVE Sensitive     * >=100,000 COLONIES/mL ENTEROBACTER AEROGENES  SARS CORONAVIRUS 2 ([place] 6-24 HRS) Nasopharyngeal Nasopharyngeal Swab     Status: None   Collection Time: 07/17/19  2:31 PM   Specimen: Nasopharyngeal Swab  Result Value Ref Range Status   SARS Coronavirus 2 NEGATIVE NEGATIVE Final    Comment: (NOTE) SARS-CoV-2 target nucleic acids are NOT DETECTED. The SARS-CoV-2 RNA is generally detectable in upper and lower respiratory specimens during the acute phase of infection. Negative results do not preclude SARS-CoV-2 infection, do not rule out co-infections with other pathogens, and should not be used as the sole basis for treatment or other patient management decisions.  Negative results must be combined with clinical observations, patient history, and epidemiological information. The expected result is Negative. Fact Sheet for Patients: SugarRoll.be Fact Sheet for Healthcare Providers: https://www.woods-mathews.com/ This test is not yet approved or cleared by the [place] FDA and  has been authorized for detection and/or diagnosis of SARS-CoV-2 by FDA under an Emergency Use Authorization (EUA). This EUA will remain  in effect (meaning this test can be used) for the duration of the COVID-19 declaration under Section 56 4(b)(1) of the Act, 21 U.S.C. section 360bbb-3(b)(1), unless the authorization is terminated or revoked sooner. Performed at [facility], [place] [address] [place], [place] [address]   MRSA PCR Screening     Status: None   Collection Time:  07/17/19 10:00 PM   Specimen: Nasal Mucosa; Nasopharyngeal  Result Value Ref Range Status   MRSA by PCR NEGATIVE NEGATIVE Final    Comment:        The GeneXpert MRSA Assay (FDA approved for NASAL specimens only), is one component of a comprehensive MRSA colonization surveillance program. It is not intended to diagnose MRSA infection nor to guide or monitor treatment for MRSA infections. Performed at [facility], [address], [place], [place] [address]   Novel Coronavirus, NAA (hospital order; send-out to ref lab)     Status: None   Collection Time: 07/21/19 11:00 AM   Specimen: Nasopharyngeal Swab; Respiratory  Result Value Ref Range Status   SARS-CoV-2, NAA NOT DETECTED NOT DETECTED Final    Comment: (NOTE) This nucleic acid amplification test was developed and its performance characteristics determined by [employer]. Nucleic acid amplification tests include PCR and TMA. This test has not been FDA cleared or approved. This test has been authorized by FDA under an Emergency Use Authorization (EUA). This test is only authorized for the duration of time the declaration that circumstances exist justifying the authorization of the emergency use of in vitro diagnostic tests for detection of SARS-CoV-2 virus and/or diagnosis of COVID-19 infection under section 564(b)(1) of the Act, 21 U.S.C. 474QVZ-5(G) (1), unless the authorization is terminated or revoked sooner. When diagnostic testing is negative, the possibility of a false negative result should be considered in the context of a patient's recent exposures and the presence of clinical signs and symptoms consistent with COVID-19. An individual without symptoms of COVID- 19 and who is not shedding SARS-CoV-2 vi rus would expect to have a negative (not detected) result in this assay. Performed At: [facility] [address] [place], [place] [id number] Katina Degree MDPhD RJ:[id number]    Coronavirus Source  NASOPHARYNGEAL  Final    Comment: Performed at [facility], [address] [place], [place] [address]     Scheduled Meds: . sodium chloride   Intravenous Once  . allopurinol  100 mg Oral BID  . atorvastatin  40 mg Oral QHS  . Chlorhexidine Gluconate Cloth  6 each Topical Daily  . ciprofloxacin  500 mg Oral Daily  . darbepoetin (ARANESP) injection - NON-DIALYSIS  150 mcg Subcutaneous Q Thu-1800  . famotidine  20 mg Oral QHS  . umeclidinium bromide  1 puff Inhalation Daily   And  . fluticasone furoate-vilanterol  1 puff Inhalation Daily  . insulin aspart  0-9 Units Subcutaneous TID WC  . insulin glargine  5 Units Subcutaneous QHS  . metoprolol tartrate  25 mg Oral BID  . pantoprazole (PROTONIX) IV  40 mg Intravenous Q12H  . sertraline  50 mg Oral Daily  . sodium  chloride flush  3 mL Intravenous Q12H   Continuous Infusions: . sodium chloride    . sodium chloride 50 mL/hr at 07/23/19 1000  . amiodarone 30 mg/hr (07/23/19 1000)  . diltiazem (CARDIZEM) infusion Stopped (07/20/19 1028)  . ferumoxytol      Procedures/Studies: CT ABDOMEN PELVIS WO CONTRAST  Result Date: 07/17/2019 CLINICAL DATA:  Abdominal pain, nausea and vomiting EXAM: CT ABDOMEN AND PELVIS WITHOUT CONTRAST TECHNIQUE: Multidetector CT imaging of the abdomen and pelvis was performed following the standard protocol without IV contrast. COMPARISON:  None. FINDINGS: Lower chest: Increased bibasilar bandlike opacities consistent with atelectasis. Right middle lobe indeterminate small nodules noted, series 4, images 3 and 7. These measure 5 mm. Stable cardiomegaly. No pericardial or pleural effusion. Large hiatal hernia again noted with the majority of the stomach in the chest. Hepatobiliary: Limited without IV contrast. Remote cholecystectomy. No intrahepatic biliary dilatation or large focal hepatic abnormality. Pancreas: Unremarkable. No pancreatic ductal dilatation or surrounding inflammatory changes. Spleen: Normal in size  without focal abnormality. Adrenals/Urinary Tract: Normal adrenal glands. Kidneys demonstrate mild chronic perinephric strandy edema. Renal obstruction or hydronephrosis. No hydroureter or obstructing ureteral calculus adjacent chronic calcified gonadal vein phleboliths noted on the left. Urinary bladder unremarkable. Stomach/Bowel: Large hiatal hernia again noted. Majority of stomach in the chest. Negative for obstruction, significant dilatation, ileus, or free air. Extensive colonic diverticulosis without acute focal inflammatory process or wall thickening. No fluid collection, hemorrhage, hematoma, abscess or ascites. Vascular/Lymphatic: Abdominal atherosclerosis noted. Negative for aneurysm. No retroperitoneal hemorrhage or hematoma. No bulky adenopathy. Reproductive: Status post hysterectomy. No adnexal masses. Other: No abdominal wall hernia or abnormality. No abdominopelvic ascites. Musculoskeletal: Remote lumbar spinal fusion. No acute osseous finding. Improved body anasarca compared to the prior study. IMPRESSION: Stable large hiatal hernia with majority of the stomach in the chest. Increased bibasilar atelectasis Right middle lobe 5 mm pulmonary nodules remain indeterminate. No follow-up needed if patient is low-risk. Non-contrast chest CT can be considered in 12 months if patient is high-risk. This recommendation follows the consensus statement: Guidelines for Management of Incidental Pulmonary Nodules Detected on CT Images: From the Fleischner Society 2017; Radiology 2017; 284:228-243. Diverticulosis without acute inflammatory process Improved body anasarca Electronically Signed   By: Jerilynn Mages.  Shick M.D.   On: 07/17/2019 13:06   DG Chest 2 View  Result Date: 07/05/2019 CLINICAL DATA:  Congestion and shortness of breath EXAM: CHEST - 2 VIEW COMPARISON:  Chest radiograph July 04, 2019 FINDINGS: Port-A-Cath tip is in the superior vena cava. No pneumothorax. There is mild interstitial prominence with  suspected mild interstitial edema. No consolidation. Heart is mildly enlarged with pulmonary venous hypertension. No evident adenopathy. 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 There is degenerative change in the thoracic spine. IMPRESSION: There is a degree of pulmonary vascular congestion with mild interstitial edema. No consolidation. There may be a degree of congestive heart failure. Sizable paraesophageal hernia noted. Port-A-Cath tip in superior vena cava. Electronically Signed   By: Lowella Grip III M.D.   On: 07/05/2019 10:52   CT Head Wo Contrast  Result Date: 07/05/2019 CLINICAL DATA:  Unexplained altered level of consciousness. EXAM: CT HEAD WITHOUT CONTRAST TECHNIQUE: Contiguous axial images were obtained from the base of the skull through the vertex without intravenous contrast. COMPARISON:  06/27/2015 FINDINGS: Brain: Age related atrophy. Mild chronic small-vessel change of the hemispheric white matter and pons. 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 No sign of mass lesion, hemorrhage, hydrocephalus or extra-axial collection. Vascular:  There is atherosclerotic calcification of the major vessels at the base of the brain. Skull: Negative Sinuses/Orbits: Paranasal sinuses are clear. Orbits are negative. Mastoid effusion on the right. Other: None IMPRESSION: No acute intracranial finding. Atrophy and chronic small-vessel ischemic changes as seen previously. Electronically Signed   By: Nelson Chimes M.D.   On: 07/05/2019 11:04   US RENAL  Result Date: 07/06/2019 CLINICAL DATA:  Acute kidney injury. EXAM: RENAL / URINARY TRACT ULTRASOUND COMPLETE COMPARISON:  Noncontrast abdominal CT 05/31/2019, renal ultrasound 09/10/2017 FINDINGS: Right Kidney: Renal measurements: 8.9 x 5.6 x 6.1 cm = volume: 160 mL. No hydronephrosis. There is renal cortical thinning with increased parenchymal echogenicity. Mild scarring. Simple cyst measuring 1.2 cm in the upper kidney again seen. No  suspicious solid lesion. No shadowing calculi. Left Kidney: Renal measurements: 9.0 x 5.5 x 5.0 cm = volume: 132 mL. No hydronephrosis. There is thinning of renal parenchyma with increased renal echogenicity. Bladder: No bladder wall thickening. Both ureteral jets are visualized. Reported post void residual of 284 cc. Prevoid bladder volume not recorded. Other: None. IMPRESSION: 1. No hydronephrosis. 2. Thinning of bilateral renal parenchyma with increased renal echogenicity most consistent with chronic medical renal disease. 3. Post void residual of 284 cc. Electronically Signed   By: Keith Rake M.D.   On: 07/06/2019 12:45   DG Chest Portable 1 View  Result Date: 07/17/2019 CLINICAL DATA:  Vomiting, abdominal pain. EXAM: PORTABLE CHEST 1 VIEW COMPARISON:  July 05, 2019. FINDINGS: Stable cardiomediastinal silhouette. Right internal jugular Port-A-Cath is unchanged. No pneumothorax or pleural effusion is noted. No acute pulmonary disease is noted. Bony thorax is unremarkable. Stable hiatal hernia. IMPRESSION: No acute cardiopulmonary abnormality seen.  Stable hiatal hernia. Electronically Signed   By: Marijo Conception M.D.   On: 07/17/2019 10:53   DG Chest Portable 1 View  Result Date: 07/04/2019 CLINICAL DATA:  Altered mental status. Recent positive blood culture EXAM: PORTABLE CHEST 1 VIEW COMPARISON:  June 15, 2019 FINDINGS: Port-A-Cath tip is in the superior vena cava. No pneumothorax. There is no appreciable edema or consolidation. There is slight atelectatic change in the left lower lobe. Heart is upper normal in size with pulmonary vascularity normal. No adenopathy. 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 No bone lesions. IMPRESSION: Large paraesophageal hernia. Mild left base atelectasis. No edema or consolidation. Stable cardiac silhouette. Port-A-Cath tip in superior vena cava. Electronically Signed   By: Lowella Grip III M.D.   On: 07/04/2019 11:26   DG Abd Portable 1  View  Result Date: 07/17/2019 CLINICAL DATA:  Weakness, vomiting. EXAM: PORTABLE ABDOMEN - 1 VIEW COMPARISON:  September 03, 2010. FINDINGS: The bowel gas pattern is normal. No radio-opaque calculi or other significant radiographic abnormality are seen. IMPRESSION: Negative. Electronically Signed   By: Marijo Conception M.D.   On: 07/17/2019 10:53    Orson Eva, DO  [facility] Pager [phone]  If 7PM-7AM, please contact night-coverage [email or web address] Password [email or web address] 07/23/2019, 11:43 AM   LOS: 5 days

## 2019-07-23 NOTE — Progress Notes (Addendum)
Progress Note  Patient Name: [name] Date of Encounter: 07/23/2019  Primary Cardiologist: [name], MD  Subjective   States that she feels "okay," no chest pain or sense of palpitations.  Appetite fair.  No abdominal pain.  Inpatient Medications    Scheduled Meds: . sodium chloride   Intravenous Once  . allopurinol  100 mg Oral BID  . atorvastatin  40 mg Oral QHS  . Chlorhexidine Gluconate Cloth  6 each Topical Daily  . ciprofloxacin  500 mg Oral Daily  . darbepoetin (ARANESP) injection - NON-DIALYSIS  150 mcg Subcutaneous Q Thu-1800  . famotidine  20 mg Oral QHS  . umeclidinium bromide  1 puff Inhalation Daily   And  . fluticasone furoate-vilanterol  1 puff Inhalation Daily  . insulin aspart  0-9 Units Subcutaneous TID WC  . insulin glargine  5 Units Subcutaneous QHS  . metoprolol tartrate  25 mg Oral BID  . pantoprazole (PROTONIX) IV  40 mg Intravenous Q12H  . sertraline  50 mg Oral Daily  . sodium chloride flush  3 mL Intravenous Q12H  . tamsulosin  0.4 mg Oral Daily   Continuous Infusions: . sodium chloride    . sodium chloride 50 mL/hr at 07/23/19 0600  . amiodarone 30 mg/hr (07/23/19 0600)  . diltiazem (CARDIZEM) infusion Stopped (07/20/19 1028)   PRN Meds: sodium chloride, acetaminophen **OR** acetaminophen, albuterol, sodium chloride flush   Vital Signs    Vitals:   07/23/19 0300 07/23/19 0400 07/23/19 0500 07/23/19 0600  BP: (!) 82/42 (!) 102/52 (!) 87/54 (!) 83/39  Pulse: (!) 130 93 (!) 129 (!) 122  Resp: 19 16 14 17   Temp:  98.3 F (36.8 C)    TempSrc:  Oral    SpO2: 97% 97% 99% 96%  Weight:   89.7 kg   Height:        Intake/Output Summary (Last 24 hours) at 07/23/2019 0807 Last data filed at 07/23/2019 0600 Gross per 24 hour  Intake 1267.61 ml  Output --  Net 1267.61 ml   Filed Weights   07/21/19 0500 07/22/19 0500 07/23/19 0500  Weight: 81.5 kg 83.1 kg 89.7 kg    Telemetry    Coarse Afib vs atyp flutter, HR  90s-120s, spikes w/ any activity Personally reviewed.  Physical Exam   General: Elderly obese female in no acute distress Lungs: Scattered rhonchi to auscultation. Heart: Irregularly irregular, no gallop. Abdomen: Bowel sounds are present, abdomen soft and non-tender. Extremities: No clubbing, cyanosis or edema.    Neuro: Alert and oriented X 3.  Labs    Chemistry Recent Labs  Lab 07/17/19 0940 07/20/19 0722 07/22/19 0350 07/23/19 0351  NA 139 143 139 136  K 4.4 4.1 4.1 4.5  CL 92* 96* 93* 92*  CO2 31 33* 33* 30  GLUCOSE 142* 148* 113* 112*  BUN 97* 87* 98* 105*  CREATININE 3.76* 3.37* 5.55* 6.41*  CALCIUM 7.7* 8.6* 8.3* 7.8*  PROT 6.7 7.0  --   --   ALBUMIN 2.3* 2.3*  --  1.9*  AST 12* 14*  --   --   ALT 10 9  --   --   ALKPHOS 61 64  --   --   BILITOT 1.2 1.3*  --   --   GFRNONAA 11* 13* 7* 6*  GFRAA 13* 15* 8* 7*  ANIONGAP 16* 14 13 14      Hematology Recent Labs  Lab 07/20/19 3785 07/22/19 0350 07/22/19 1143 07/23/19 0351  WBC 8.1 7.0  --  6.7  RBC 3.11* 2.62*  --  2.47*  HGB 8.6* 7.3* 7.6* 6.9*  HCT 28.6* 24.0* 25.0* 22.6*  MCV 92.0 91.6  --  91.5  MCH 27.7 27.9  --  27.9  MCHC 30.1 30.4  --  30.5  RDW 17.6* 16.9*  --  16.6*  PLT 318 286  --  244    Cardiac Enzymes Recent Labs  Lab 07/05/19 1753 07/05/19 1955 07/05/19 2157 07/17/19 0940 07/17/19 1042  TROPONINIHS 52* 50* 51* 19* 20*    Radiology    No results found.  Cardiac Studies   Echocardiogram 06/16/2019:  1. Left ventricular ejection fraction, by visual estimation, is 55 to 60%. The left ventricle has normal function. There is no left ventricular hypertrophy.  2. Elevated left atrial pressure.  3. Left ventricular diastolic parameters are consistent with Grade II diastolic dysfunction (pseudonormalization).  4. Global right ventricle has normal systolic function.The right ventricular size is normal. No increase in right ventricular wall thickness.  5. Left atrial size was  moderately dilated.  6. Right atrial size was normal.  7. The mitral valve was not well visualized. Trace mitral valve regurgitation. No evidence of mitral stenosis.  8. The tricuspid valve is normal in structure. Tricuspid valve regurgitation is mild.  9. The aortic valve was not well visualized. Aortic valve regurgitation is not visualized. No evidence of aortic valve sclerosis or stenosis. 10. The pulmonic valve was not well visualized. Pulmonic valve regurgitation not assessed. 11. The aortic root was not well visualized. 12. Moderately elevated pulmonary artery systolic pressure. 13. Moderate pulmonary HTN, PASP is 48 mmHg. 14. The inferior vena cava is normal in size with greater than 50% respiratory variability, suggesting right atrial pressure of 3 mmHg. 15. The interatrial septum was not well visualized.  Patient Profile     73 y.o. female with past medical history of chronic diastolic CHF, CAD (nonobstructive CAD by cath in 2000), HTN, HLD, Stage 3 CKD , COPD, and OSA (on CPAP) presenting to the hospital after recent coffee-ground emesis with EGD showing mild erosive esophagitis and Schatzki ring requiring dilatation.  She subsequently developed atypical atrial flutter with RVR requiring further management.  Assessment & Plan    1.  Atrial fibrillation versus atypical atrial flutter with RVR. - on amio 30 mg/hr and Lopressor 25 mg bid, no BB doses missed yesterday - continue current therapy - HR control suboptimal, but also BP low this am - w/ drop in Hgb, cannot anticoagulate  2.  Chronic diastolic heart failure.  - EF 55-60%, nl RV function - not on diuretics  3.  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  - BUN/CR trending up 105/6.41 today, UOP very poor - Nephrology seeing  4.  Recent GI bleed with associated anemia.  EGD on December 14 showed mild erosive esophagitis and Schatzki ring requiring dilatation.   - H&H continue to drop, today  6.9/22.6 - for 2 U PRBCs - not an anticoag candidate at this time  5.  Nonobstructive CAD by history.  - HS trop peak 52, trended down - more c/w acute illness than ACS   Signed, Rosaria Ferries, PA-C  07/23/2019, 8:07 AM     Attending note:  Patient seen and examined.  I reviewed the chart, also discussed the case with Nephrology.  Ms. Panozzo shows worsening renal function and also urine output, likely ATN with anemia and low blood pressures.  She remains  in coarse atrial fibrillation versus atypical atrial flutter with heart rates better controlled but still not optimal on IV amiodarone and low-dose Lopressor.  On examination she is in no acute distress this morning.  Afebrile.  Heart rate 098-119, systolic blood pressure 14N to low 100s.  Lungs exhibit scattered rhonchi, decreased at the bases.  Cardiac exam reveals irregularly irregular rhythm without gallop.  Potassium 4.5, BUN 105, creatinine 6.41, hemoglobin down to 6.9, platelets 244.  Difficult management situation.  Cannot pursue electrical cardioversion off anticoagulation, and certainly reluctant to resume anticoagulation now with decreasing hemoglobin.  Would continue IV amiodarone and oral beta-blocker as tolerated.  She is getting packed red cell transfusions, hopefully this will further assist hemodynamics and blood pressure.  May need to be considered for transfer to [facility] if renal function continues to worsen as CRRT would need to be considered.  If she were to further stabilize and could be started back on anticoagulation ultimately, TEE cardioversion could also be pursued.  If on the other hand her status continues to worsen and she does not want more aggressive measures, palliative care consultation would be suggested.  Satira Sark, M.D., F.A.C.C.

## 2019-07-23 NOTE — Progress Notes (Signed)
Subjective: States she's doing ok overall. Notes "I'm in the hospital all the time." Has some mild/moderate LLQ discomfort. No overt GERD symptoms. Last BM 2 days ago with small bits of black (although mostly soft, brown stool), overall improved compared to previous melanotic stools. [name] had some increased dyspnea and weakness/fatigue in the past day but notes she has chronic COPD. No other GI complaints.  Objective: Vital signs in last 24 hours: Temp:  [98 F (36.7 C)-98.3 F (36.8 C)] 98.3 F (36.8 C) (12/18 0400) Pulse Rate:  [51-138] 115 (12/18 0800) Resp:  [13-27] 21 (12/18 0800) BP: (82-137)/(39-90) 101/52 (12/18 0800) SpO2:  [93 %-100 %] 99 % (12/18 0800) FiO2 (%):  [28 %] 28 % (12/18 0200) Weight:  [89.7 kg] 89.7 kg (12/18 0500) Last BM Date: 07/21/19 General:   Alert and oriented, pleasant Eyes:  No icterus, sclera clear. Conjuctiva pink.  Heart:  S1, S2 present, no murmurs noted.  Lungs: Clear to auscultation bilaterally, without wheezing, rales, or rhonchi.  Abdomen:  Bowel sounds present, soft, non-tender, non-distended. No HSM or hernias noted. No rebound or guarding. No masses appreciated  Msk:  Symmetrical without gross deformities. Pulses:  Normal bilateral DP pulses noted. Extremities:  Without clubbing or edema. Neurologic:  Alert and  oriented x4;  grossly normal neurologically. Skin:  Warm and dry, intact without significant lesions.  Psych:  Alert and cooperative. Normal mood and affect.  Intake/Output from previous day: 12/17 0701 - 12/18 0700 In: 1267.6 [I.V.:1267.6] Out: -  Intake/Output this shift: No intake/output data recorded.  Lab Results: Recent Labs    07/22/19 0350 07/22/19 1143 07/23/19 0351  WBC 7.0  --  6.7  HGB 7.3* 7.6* 6.9*  HCT 24.0* 25.0* 22.6*  PLT 286  --  244   BMET Recent Labs    07/22/19 0350 07/23/19 0351  NA 139 136  K 4.1 4.5  CL 93* 92*  CO2 33* 30  GLUCOSE 113* 112*  BUN 98* 105*  CREATININE 5.55* 6.41*   CALCIUM 8.3* 7.8*   LFT Recent Labs    07/23/19 0351  ALBUMIN 1.9*   PT/INR No results for input(s): LABPROT, INR in the last 72 hours. Hepatitis Panel No results for input(s): HEPBSAG, HCVAB, HEPAIGM, HEPBIGM in the last 72 hours.   Studies/Results: No results found.  Assessment: 73 year old lady with atrial fibrillation, chronically anticoagulated with Eliquis, admitted with 1 week history of nausea and recent coffee-ground emesis.  Minimal drop in hemoglobin from baseline.  History of complicated GERD with stricture, chronic esophageal dysphagia and Cameron ulcers.  Patient admits to frequent Aleve use on a regular basis for back pain up until 1 week prior to admission when she stopped due to nausea.    EGD on 12/14 with mild erosive reflux esophagitis, mild Schatzki ring status post dilation, large hiatal hernia, erythematous gastric mucosa. Circumferential esophageal erosions 5 mm from the GE junction. Felt UGI bleed precipitated by NSAIDs in the setting of chronic anticoagulation.  After her procedure she was feeling better and hgb improved to 8.6. Had 2 soft brown stools. Recommended avoid all NSAIDs and restart Eliquis 07/25/19 at which point we signed off.  We were asked to re-consult due to further decline in hgb. Still off Eliquis but hgb began to decline again: 8.6 > 7.3 >7.6 > 6.9 (this morning). She is still on Pepcid 20 mg po daily and Protonix 40 mg IV bid. No NSAIDs noted in med list.   Today she notes stools  2 days ago with a little black color but mostly brown, which is an improvement from previous. No other obvious evidence of recurrent GIB (no hematemesis or hematochezia). Some worsening fatigue, dyspnea. Her hgb has drifted down 1.5. gm in 3 days. Planned PRBC today.  Of note, her kidney function continues to deteriorate and per hospitalist may need to go to [facility] for CRRT (Cr today 6.41).   Plan: 1. Follow hgb closely 2. Monitor for obvious GI bleed 3. If hgb  continues to decline there may be need for repeat procedure 4. Supportive measures 5. Will follow-up tomorrow   Thank you for allowing [place] to participate in the care of [name]  Tavaras Goody, DNP, AGNP-C Adult & Gerontological Nurse Practitioner [facility]     LOS: 5 days    07/23/2019, 9:41 AM

## 2019-07-23 NOTE — Progress Notes (Signed)
Unable to obtain peripheral IV for transfusion of blood. Multiple attempts made yesterday per report and unsuccessful attempt today. Pt has port with amio gtt infusing. ED RN with ultrasound called will come when able. Will continue to monitor closely.

## 2019-07-23 NOTE — Progress Notes (Signed)
Pt refuses CPAP 

## 2019-07-23 NOTE — Progress Notes (Signed)
Subjective:  BP cont to be soft, as low as in the 80's - rate not well controlled -  No UOP recorded-  Bladder scans not excessive-  Renal function worse - she does not feel appreciably worse  Objective Vital signs in last 24 hours: Vitals:   07/23/19 0300 07/23/19 0400 07/23/19 0500 07/23/19 0600  BP: (!) 82/42 (!) 102/52 (!) 87/54 (!) 83/39  Pulse: (!) 130 93 (!) 129 (!) 122  Resp: 19 16 14 17   Temp:  98.3 F (36.8 C)    TempSrc:  Oral    SpO2: 97% 97% 99% 96%  Weight:   89.7 kg   Height:       Weight change: 6.6 kg  Intake/Output Summary (Last 24 hours) at 07/23/2019 0826 Last data filed at 07/23/2019 0600 Gross per 24 hour  Intake 1267.61 ml  Output --  Net 1267.61 ml    Assessment/Plan: 73 year old white female with many medical issues including CKD.  She has developed acute on chronic renal failure in the hospital 1.Renal-acute on chronic renal failure while in the hospital- baseline crt around 3.  Initial urinalysis looks like UTI with Enterobacter that has not been treated, being treated with cipro but just started 12/17. Urine still looks like UTI.   bladder scans not remarkable-  Possibly some incontinence.  I think the most likely scenario is that her acute on chronic renal failure is hemodynamic in the setting of hypotension due to atrial fibrillation and medications to control rate.  Her volume status still actually seems OK.   IV fluids to be continued.  There are no absolute indications for dialysis but I do not have a great feeling here.  I hope that we can get this to turn around.  I feel like she would do dialysis if she needed.  If this does not turn around with blood and a better BP in the next 24-48 hours she may require RRT.  Given her afib and hemodynamics I am concerned that she would need CRRT.  I dont feel needs to be transferred just yet though   2. Hypertension/volume  -excellent possibly seems dry to me.  low rate IV fluids- also to get blood today  3.  Anemia  -significant anemia-but also felt to have GI bleed.   iron stores low, replete gave ESA on 12/17 4.  UTI- now on cipro  5. Afib- complicating-  On amio but has not converted, cannot be anticoagulated due to GIB-    Louis Meckel    Labs: Basic Metabolic Panel: Recent Labs  Lab 07/20/19 0722 07/22/19 0350 07/23/19 0351  NA 143 139 136  K 4.1 4.1 4.5  CL 96* 93* 92*  CO2 33* 33* 30  GLUCOSE 148* 113* 112*  BUN 87* 98* 105*  CREATININE 3.37* 5.55* 6.41*  CALCIUM 8.6* 8.3* 7.8*  PHOS  --   --  5.6*   Liver Function Tests: Recent Labs  Lab 07/17/19 0940 07/20/19 0722 07/23/19 0351  AST 12* 14*  --   ALT 10 9  --   ALKPHOS 61 64  --   BILITOT 1.2 1.3*  --   PROT 6.7 7.0  --   ALBUMIN 2.3* 2.3* 1.9*   Recent Labs  Lab 07/17/19 0940  LIPASE 33   No results for input(s): AMMONIA in the last 168 hours. CBC: Recent Labs  Lab 07/17/19 0940 07/18/19 0803 07/20/19 0722 07/22/19 0350 07/22/19 1143 07/23/19 0351  WBC 11.1* 7.9 8.1 7.0  --  6.7  NEUTROABS 9.6*  --   --   --   --   --   HGB 7.8* 8.1* 8.6* 7.3* 7.6* 6.9*  HCT 24.8* 26.5* 28.6* 24.0* 25.0* 22.6*  MCV 88.6 90.1 92.0 91.6  --  91.5  PLT 317 285 318 286  --  244   Cardiac Enzymes: No results for input(s): CKTOTAL, CKMB, CKMBINDEX, TROPONINI in the last 168 hours. CBG: Recent Labs  Lab 07/22/19 0743 07/22/19 1156 07/22/19 1705 07/22/19 2132 07/23/19 0713  GLUCAP 108* 137* 140* 95 109*    Iron Studies:  Recent Labs    07/23/19 0351  IRON 13*  TIBC 153*  FERRITIN 84   Studies/Results: No results found. Medications: Infusions: . sodium chloride    . sodium chloride 50 mL/hr at 07/23/19 0600  . amiodarone 30 mg/hr (07/23/19 0600)  . diltiazem (CARDIZEM) infusion Stopped (07/20/19 1028)    Scheduled Medications: . sodium chloride   Intravenous Once  . allopurinol  100 mg Oral BID  . atorvastatin  40 mg Oral QHS  . Chlorhexidine Gluconate Cloth  6 each Topical Daily  .  ciprofloxacin  500 mg Oral Daily  . darbepoetin (ARANESP) injection - NON-DIALYSIS  150 mcg Subcutaneous Q Thu-1800  . famotidine  20 mg Oral QHS  . umeclidinium bromide  1 puff Inhalation Daily   And  . fluticasone furoate-vilanterol  1 puff Inhalation Daily  . insulin aspart  0-9 Units Subcutaneous TID WC  . insulin glargine  5 Units Subcutaneous QHS  . metoprolol tartrate  25 mg Oral BID  . pantoprazole (PROTONIX) IV  40 mg Intravenous Q12H  . sertraline  50 mg Oral Daily  . sodium chloride flush  3 mL Intravenous Q12H  . tamsulosin  0.4 mg Oral Daily    have reviewed scheduled and prn medications.  Physical Exam: General: NAD-  Pleasant , appropriate to situation Heart: irreg Lungs: mostly clear Abdomen: soft non tender Extremities: no appreciable peripheral edema    07/23/2019,8:26 AM  LOS: 5 days

## 2019-07-23 NOTE — Progress Notes (Signed)
Pt felt like she could void; helped patient onto [facility]. She had an episode of incontinence before; full linen change. She voided maybe 10 mL of green, mucous appearing urine and had a small bowel movement. Bladder scanned patient once she was back in bed; 222 mL. Will continue to monitor patient.

## 2019-07-24 DIAGNOSIS — R1314 Dysphagia, pharyngoesophageal phase: Secondary | ICD-10-CM

## 2019-07-24 DIAGNOSIS — K222 Esophageal obstruction: Secondary | ICD-10-CM

## 2019-07-24 DIAGNOSIS — K920 Hematemesis: Secondary | ICD-10-CM

## 2019-07-24 DIAGNOSIS — K209 Esophagitis, unspecified without bleeding: Secondary | ICD-10-CM

## 2019-07-24 LAB — RENAL FUNCTION PANEL
Albumin: 2 g/dL — ABNORMAL LOW (ref 3.5–5.0)
Anion gap: 10 (ref 5–15)
BUN: 102 mg/dL — ABNORMAL HIGH (ref 8–23)
CO2: 30 mmol/L (ref 22–32)
Calcium: 7.5 mg/dL — ABNORMAL LOW (ref 8.9–10.3)
Chloride: 95 mmol/L — ABNORMAL LOW (ref 98–111)
Creatinine, Ser: 6.97 mg/dL — ABNORMAL HIGH (ref 0.44–1.00)
GFR calc Af Amer: 6 mL/min — ABNORMAL LOW (ref >60)
GFR calc non Af Amer: 5 mL/min — ABNORMAL LOW (ref >60)
Glucose, Bld: 111 mg/dL — ABNORMAL HIGH (ref 70–99)
Phosphorus: 6.3 mg/dL — ABNORMAL HIGH (ref 2.5–4.6)
Potassium: 4.5 mmol/L (ref 3.5–5.1)
Sodium: 135 mmol/L (ref 135–145)

## 2019-07-24 LAB — GLUCOSE, CAPILLARY
Glucose-Capillary: 105 mg/dL — ABNORMAL HIGH (ref 70–99)
Glucose-Capillary: 111 mg/dL — ABNORMAL HIGH (ref 70–99)
Glucose-Capillary: 104 mg/dL — ABNORMAL HIGH (ref 70–99)
Glucose-Capillary: 112 mg/dL — ABNORMAL HIGH (ref 70–99)
Glucose-Capillary: 103 mg/dL — ABNORMAL HIGH (ref 70–99)

## 2019-07-24 LAB — CBC
HCT: 28.5 % — ABNORMAL LOW (ref 36.0–46.0)
Hemoglobin: 8.7 g/dL — ABNORMAL LOW (ref 12.0–15.0)
MCH: 27.1 pg (ref 26.0–34.0)
MCHC: 30.5 g/dL (ref 30.0–36.0)
MCV: 88.8 fL (ref 80.0–100.0)
Platelets: 226 10*3/uL (ref 150–400)
RBC: 3.21 MIL/uL — ABNORMAL LOW (ref 3.87–5.11)
RDW: 17 % — ABNORMAL HIGH (ref 11.5–15.5)
WBC: 7.8 10*3/uL (ref 4.0–10.5)
nRBC: 0 % (ref 0.0–0.2)

## 2019-07-24 LAB — TYPE AND SCREEN
ABO/RH(D): O POS
Antibody Screen: NEGATIVE
Unit division: 0
Unit division: 0

## 2019-07-24 LAB — BPAM RBC
Blood Product Expiration Date: 202101112359
Blood Product Expiration Date: 202101122359
ISSUE DATE / TIME: 202012181146
ISSUE DATE / TIME: 202012181431
Unit Type and Rh: 5100
Unit Type and Rh: 5100

## 2019-07-24 MED ORDER — PANTOPRAZOLE SODIUM 40 MG PO TBEC
40.0000 mg | DELAYED_RELEASE_TABLET | Freq: Two times a day (BID) | ORAL | Status: DC
  Administered 2019-07-24 – 2019-08-11 (×33): 40 mg via ORAL
  Filled 2019-07-24 (×34): qty 1

## 2019-07-24 NOTE — Progress Notes (Signed)
Patient ID: [name], female   DOB: [birth year], 73 y.o.   MRN: [id number] S: doesn't feel great O:BP (!) 115/49   Pulse (!) 120   Temp 98 F (36.7 C) (Oral)   Resp 16   Ht 4\' 11"  (1.499 m)   Wt 87.9 kg   SpO2 98%   BMI 39.14 kg/m   Intake/Output Summary (Last 24 hours) at 07/24/2019 1347 Last data filed at 07/24/2019 0800 Gross per 24 hour  Intake 1909.1 ml  Output 10 ml  Net 1899.1 ml   Intake/Output: I/O last 3 completed shifts: In: 2933.4 [I.V.:2207.4; Blood:600; IV Piggyback:126.1] Out: 10 [Urine:10]  Intake/Output this shift:  Total I/O In: 209.4 [I.V.:209.4] Out: -  Weight change: -1.8 kg Gen: elderly, chronically ill-appearing, obese WF CVS: tachy at 120 Resp: cta Abd: +BS, obese, NT Ext: no edema  Recent Labs  Lab 07/18/19 0803 07/20/19 0722 07/22/19 0350 07/23/19 0351 07/24/19 0424  NA 143 143 139 136 135  K 4.2 4.1 4.1 4.5 4.5  CL 99 96* 93* 92* 95*  CO2 34* 33* 33* 30 30  GLUCOSE 111* 148* 113* 112* 111*  BUN 94* 87* 98* 105* 102*  CREATININE 3.36* 3.37* 5.55* 6.41* 6.97*  ALBUMIN  --  2.3*  --  1.9* 2.0*  CALCIUM 8.0* 8.6* 8.3* 7.8* 7.5*  PHOS  --   --   --  5.6* 6.3*  AST  --  14*  --   --   --   ALT  --  9  --   --   --    Liver Function Tests: Recent Labs  Lab 07/20/19 0722 07/23/19 0351 07/24/19 0424  AST 14*  --   --   ALT 9  --   --   ALKPHOS 64  --   --   BILITOT 1.3*  --   --   PROT 7.0  --   --   ALBUMIN 2.3* 1.9* 2.0*   No results for input(s): LIPASE, AMYLASE in the last 168 hours. No results for input(s): AMMONIA in the last 168 hours. CBC: Recent Labs  Lab 07/18/19 0803 07/20/19 0722 07/22/19 0350 07/23/19 0351 07/23/19 1843 07/24/19 0425  WBC 7.9 8.1 7.0 6.7  --  7.8  HGB 8.1* 8.6* 7.3* 6.9* 9.4* 8.7*  HCT 26.5* 28.6* 24.0* 22.6* 30.4* 28.5*  MCV 90.1 92.0 91.6 91.5  --  88.8  PLT 285 318 286 244  --  226   Cardiac Enzymes: No results for input(s): CKTOTAL, CKMB, CKMBINDEX, TROPONINI in the last  168 hours. CBG: Recent Labs  Lab 07/23/19 1127 07/23/19 1645 07/23/19 2112 07/24/19 0746 07/24/19 1148  GLUCAP 147* 148* 156* 105* 111*    Iron Studies:  Recent Labs    07/23/19 0351  IRON 13*  TIBC 153*  FERRITIN 84   Studies/Results: No results found. . sodium chloride   Intravenous Once  . allopurinol  100 mg Oral BID  . atorvastatin  40 mg Oral QHS  . Chlorhexidine Gluconate Cloth  6 each Topical Daily  . ciprofloxacin  500 mg Oral Daily  . darbepoetin (ARANESP) injection - NON-DIALYSIS  150 mcg Subcutaneous Q Thu-1800  . famotidine  20 mg Oral QHS  . umeclidinium bromide  1 puff Inhalation Daily   And  . fluticasone furoate-vilanterol  1 puff Inhalation Daily  . insulin aspart  0-9 Units Subcutaneous TID WC  . insulin glargine  5 Units Subcutaneous QHS  . metoprolol tartrate  25 mg Oral  BID  . pantoprazole  40 mg Oral BID AC  . sertraline  50 mg Oral Daily  . sodium chloride flush  3 mL Intravenous Q12H    BMET    Component Value Date/Time   NA 135 07/24/2019 0424   K 4.5 07/24/2019 0424   CL 95 (L) 07/24/2019 0424   CO2 30 07/24/2019 0424   GLUCOSE 111 (H) 07/24/2019 0424   BUN 102 (H) 07/24/2019 0424   CREATININE 6.97 (H) 07/24/2019 0424   CREATININE 1.68 (H) 10/10/2017 1050   CALCIUM 7.5 (L) 07/24/2019 0424   GFRNONAA 5 (L) 07/24/2019 0424   GFRNONAA 30 (L) 10/10/2017 1050   GFRAA 6 (L) 07/24/2019 0424   GFRAA 35 (L) 10/10/2017 1050   CBC    Component Value Date/Time   WBC 7.8 07/24/2019 0425   RBC 3.21 (L) 07/24/2019 0425   HGB 8.7 (L) 07/24/2019 0425   HCT 28.5 (L) 07/24/2019 0425   PLT 226 07/24/2019 0425   MCV 88.8 07/24/2019 0425   MCH 27.1 07/24/2019 0425   MCHC 30.5 07/24/2019 0425   RDW 17.0 (H) 07/24/2019 0425   LYMPHSABS 0.7 07/17/2019 0940   MONOABS 0.6 07/17/2019 0940   EOSABS 0.1 07/17/2019 0940   BASOSABS 0.0 07/17/2019 0940     Assessment/Plan:  1. AKI/CKD stage 4- in setting of atrial fibrillation, hypotension, h/o  NSAIDs, and ABLA due to UGIB.  She remains oliguric.  Also with h/o bladder outlet obstruction.   1. Continue to follow bladder scans and if >300 place foley catheter. 2. No significant improvement overnight.   3. Recommend starting to look for ICU bed in [place] or [facility] to initiate CRRT in the next 24 hours 2. Hematemesis/UGIB- seen by GI and EGD with erosive reflux esophagitis.  Transfused 2 units PRBC's on 07/23/19 3. Atrial fibrillation with RVR and ongoing hypotension- on amiodarone drip.  Decreased metoprolol.  Off apixaban until 07/25/19 due to GIB.  Cardiology following.  4. UTI - on cipro  Donetta Potts, MD [employer] [phone]

## 2019-07-24 NOTE — Progress Notes (Addendum)
PROGRESS NOTE  [name] VWU:[id number] DOB: October 29, 1945 DOA: 07/17/2019 PCP: [name], MD  Brief History: 73 y.o.femalewith past medical history significant for diastolic heart failure, chronic kidney disease stage4; COPD, chronic respiratory failure with hypoxia, obesity, obstructive sleep apnea,atrial flutter/atrial fibrillation (chronically on anticoagulation), gastroesophageal reflux disease/esophagitis, hiatal hernia, gout, type 2 diabetes with nephropathy and chronic anemiaof CKD; who presented to the hospital secondary to abdominal pain, nausea, vomiting and coffee-ground emesis. Patient currently in nursing facility for rehabilitation purposes after recent admission due to acute on chronic heart failure exacerbation andacute on chronic CKD4. Apparently patient complains of sudden episode of nausea/vomiting with subsequent coffee-ground emesis x2 and nursing home. Associated symptoms included abdominal pain, nonradiating, mid epigastric region, 5out of 10 in intensity.All hersymptoms present on the day of admission. Patient chronically on anticoagulation and compliant with medications prior to admission. GI was consulted to assist. She underwent EGD on 12/14/20showingmild erosive reflux esophagitis,Mild Schatzki rings/p dilated,Large hiatal hernia,Erythematous gastric mucosa, andNormal duodenal bulb and second portion of the duodenum  In the ED patient's hemoglobin was found to be slightly lower than baseline (at 7.8), renal function higher than from recent discharge at 3.76 and a BUN of 97. No further episode of vomiting since she was in the emergency department. Antiemetics have been provided. TRH call to place patient in observation for further evaluation and management of coffee-ground emesis. Urinalysis demonstrated pyuria but no other symptoms suggesting acute UTI. Patient wastype and screen, gentle fluid resuscitation given and  started on PPI.During evening of 12/14-12/15, she developed atrial fib with RVR. She was started on amiodarone drip due to soft BPs. Cardiology was consulted to assist. During the hospitalization, her serum creatinine worsened again. Nephrology was consulted to assist.  Unfortunately, the patient's renal function continued to worsen.  Nephrology felt patient needed to transfer to [facility] for CRRT.  Assessment/Plan: Upper GI Bleed/Hematemesis -due NSAIDS and erosive esophagitis -EGD on 07/19/19 withMild erosive reflux esophagitis,Mild Schatzki rings/p dilated,Large hiatal hernia,Erythematous gastric mucosa, andNormal duodenal bulb and second portion of the duodenum. -avoid Aleve and all NSAIDs -continue PPI BID -resume apixaban on 07/25/19 per GI if Hgb remains stable -Upper GI bleed likely precipitated by NSAIDs and chronic anticoagulation -12/17--Hgbappears to be dropping again--GI reconsulted -last BM 12/16 without hematochezia, mostly brown -discussed with GI-->may need repeat EGD -12/18--transfused 2 units PRBC; received Feraheme x 1  -12/18 evening--had BM--brown stool without blood  Atrial Fibrillation with RVR, type unspecified -hx of atypical atrial flutter -appreciate cardiology followup -rate control difficult due to soft BPs -continue amiodarone drip -d/c hydralazine to allow BP margin for titration of meds -decreased metoprolol (succinate 75 mg to tartrate 25 mg bid)--pt did not receive at all on 12/18 due to low BP -holding apixaban till 12/20/20if Hgb remains stable  Acute on chronic renal failure--CKD stage IV -Baseline creatinine 2.8-3.1 -due to hemodynamic changes -Patient was previously taking torsemide 100 mg daily,and was decreased to 50 mg at the time of her last hospital discharge with instructions to titrate back up to home dose as tolerated -appreciate renal consult -hopeful serum creatinine will reach plateau in next 24hrs -may need CRRT if no  improvement--pt agreeable should she require it -07/23/19--case discussed with renal--Dr. Sabino Donovan to [facility] ICU for initiation of CRRT -discussed with PCCM, Dr. Shearon Stalls whom graciously accepted patient -A.m. renal panel  Chronic Diastolic CHF -holding torsemideinitiallydue to acute on chronic renal failure -Patient was previously taking torsemide 100 mg daily,and  was decreased to 50 mg at the time of her last hospital discharge with instructions to titrate back up to home dose as tolerated -am BMP -daily weights -06/16/19 echo EF 55-60%, G2DD,mild MR, trace TR, PASP 48 -still remains clinically euvolemic  Pyuria -12/17 UA-->50WBC -started on cipro 12/17 empirically -still having "white thick" urine--check urine culture  Nonobstructive CAD by history. -High-sensitivity troponin I levels are not suggestive of ACS.  Chronic respiratory failure with hypoxia -Patient is normally on 2 L at nighttime and as needed during the day -currently stable on 2L  Type 2 diabetes with nephropathy (HCC) -Continue sliding scale insulin adjusted dose of Lantus nightly -07/05/19 A1C--6.8 -CBGs controlled  Obesity, Class III, BMI 40-49.9 (morbid obesity) (HCC) -Body mass index is 36.33 kg/m. -Lifestyle modification  COPD (chronic obstructive pulmonary disease) (HCC) -No signs of acute exacerbation -Continue current oxygen supplementation -Continue home inhaler regimen.  Essential hypertension -BP remains soft -Continuemetoprolol tartrate as discussed above -diltiazem on hold due to soft BPs  Depression/anxiety -No suicidal ideation or hallucination -Continue Zoloft.  Gouty arthritis -Continue allopurinol  Goals of Care -Advance care planning, including the explanation and discussion of advance directives was carried out with the patient and family.  Code status including explanations of "Full Code" and "DNR" and alternatives were discussed in detail.   Discussion of end-of-life issues including but not limited palliative care, hospice care and the concept of hospice, other end-of-life care options, power of attorney for health care decisions, living wills, and physician orders for life-sustaining treatment were also discussed with the patient and family.   -Full scope of care for now.   Disposition Plan:   [facility] Family Communication:  sons updated 12/19  Consultants:  GI, cardiology, PCCM  Code Status:  FULL   DVT Prophylaxis:  SCDs   Procedures: As Listed in Progress Note Above  Antibiotics: None   Subjective: Patient denies fevers, chills, headache, chest pain, dyspnea, nausea, vomiting, diarrhea, abdominal pain, dysuria, hematuria, hematochezia, and melena.  She ate breakfast without difficulty   Objective: Vitals:   07/24/19 0751 07/24/19 0800 07/24/19 0900 07/24/19 1149  BP:  117/69 (!) 115/49   Pulse:  (!) 143 (!) 139 (!) 120  Resp:  20 18 16   Temp:    98 F (36.7 C)  TempSrc:    Oral  SpO2: 96% 97% 98% 98%  Weight:      Height:        Intake/Output Summary (Last 24 hours) at 07/24/2019 1157 Last data filed at 07/24/2019 0800 Gross per 24 hour  Intake 2144.1 ml  Output 10 ml  Net 2134.1 ml   Weight change: -1.8 kg Exam:   General:  Pt is alert, follows commands appropriately, not in acute distress  HEENT: No icterus, No thrush, No neck mass, /AT  Cardiovascular: IRRR, S1/S2, no rubs, no gallops  Respiratory: bibasilar crackles, no wheeze  Abdomen: Soft/+BS, non tender, non distended, no guarding  Extremities: trace LE edema, No lymphangitis, No petechiae, No rashes, no synovitis   Data Reviewed: I have personally reviewed following labs and imaging studies Basic Metabolic Panel: Recent Labs  Lab 07/18/19 0803 07/20/19 0722 07/22/19 0350 07/23/19 0351 07/24/19 0424  NA 143 143 139 136 135  K 4.2 4.1 4.1 4.5 4.5  CL 99 96* 93* 92* 95*  CO2 34* 33* 33*  30 30  GLUCOSE 111* 148* 113* 112* 111*  BUN 94* 87* 98* 105* 102*  CREATININE 3.36* 3.37* 5.55* 6.41*  6.97*  CALCIUM 8.0* 8.6* 8.3* 7.8* 7.5*  MG  --   --  1.6*  --   --   PHOS  --   --   --  5.6* 6.3*   Liver Function Tests: Recent Labs  Lab 07/20/19 0722 07/23/19 0351 07/24/19 0424  AST 14*  --   --   ALT 9  --   --   ALKPHOS 64  --   --   BILITOT 1.3*  --   --   PROT 7.0  --   --   ALBUMIN 2.3* 1.9* 2.0*   No results for input(s): LIPASE, AMYLASE in the last 168 hours. No results for input(s): AMMONIA in the last 168 hours. Coagulation Profile: No results for input(s): INR, PROTIME in the last 168 hours. CBC: Recent Labs  Lab 07/18/19 0803 07/20/19 0722 07/22/19 0350 07/22/19 1143 07/23/19 0351 07/23/19 1843 07/24/19 0425  WBC 7.9 8.1 7.0  --  6.7  --  7.8  HGB 8.1* 8.6* 7.3* 7.6* 6.9* 9.4* 8.7*  HCT 26.5* 28.6* 24.0* 25.0* 22.6* 30.4* 28.5*  MCV 90.1 92.0 91.6  --  91.5  --  88.8  PLT 285 318 286  --  244  --  226   Cardiac Enzymes: No results for input(s): CKTOTAL, CKMB, CKMBINDEX, TROPONINI in the last 168 hours. BNP: Invalid input(s): POCBNP CBG: Recent Labs  Lab 07/23/19 1127 07/23/19 1645 07/23/19 2112 07/24/19 0746 07/24/19 1148  GLUCAP 147* 148* 156* 105* 111*   HbA1C: No results for input(s): HGBA1C in the last 72 hours. Urine analysis:    Component Value Date/Time   COLORURINE YELLOW 07/22/2019 1038   APPEARANCEUR TURBID (A) 07/22/2019 1038   LABSPEC >1.030 (H) 07/22/2019 1038   PHURINE 6.0 07/22/2019 1038   GLUCOSEU NEGATIVE 07/22/2019 1038   HGBUR SMALL (A) 07/22/2019 1038   BILIRUBINUR SMALL (A) 07/22/2019 1038   KETONESUR TRACE (A) 07/22/2019 1038   PROTEINUR 100 (A) 07/22/2019 1038   UROBILINOGEN 0.2 01/29/2007 1500   NITRITE NEGATIVE 07/22/2019 1038   LEUKOCYTESUR MODERATE (A) 07/22/2019 1038   Sepsis Labs: @LABRCNTIP (procalcitonin:4,lacticidven:4) ) Recent Results (from the past 240 hour(s))  Urine culture     Status:  Abnormal   Collection Time: 07/17/19  9:40 AM   Specimen: Urine, Clean Catch  Result Value Ref Range Status   Specimen Description   Final    URINE, CLEAN CATCH Performed at [facility], [address], [place], [place] [address]    Special Requests   Final    NONE Performed at [facility], [address], [place],  [address]    Culture >=100,000 COLONIES/mL ENTEROBACTER AEROGENES (A)  Final   Report Status 07/20/2019 FINAL  Final   Organism ID, Bacteria ENTEROBACTER AEROGENES (A)  Final      Susceptibility   Enterobacter aerogenes - MIC*    CEFAZOLIN >=64 RESISTANT Resistant     CEFTRIAXONE <=1 SENSITIVE Sensitive     CIPROFLOXACIN <=0.25 SENSITIVE Sensitive     GENTAMICIN <=1 SENSITIVE Sensitive     IMIPENEM 1 SENSITIVE Sensitive     NITROFURANTOIN 64 INTERMEDIATE Intermediate     TRIMETH/SULFA <=20 SENSITIVE Sensitive     PIP/TAZO <=4 SENSITIVE Sensitive     * >=100,000 COLONIES/mL ENTEROBACTER AEROGENES  SARS CORONAVIRUS 2 (Shoshanah Dapper 6-24 HRS) Nasopharyngeal Nasopharyngeal Swab     Status: None   Collection Time: 07/17/19  2:31 PM   Specimen: Nasopharyngeal Swab  Result Value Ref Range Status   SARS Coronavirus 2 NEGATIVE  NEGATIVE Final    Comment: (NOTE) SARS-CoV-2 target nucleic acids are NOT DETECTED. The SARS-CoV-2 RNA is generally detectable in upper and lower respiratory specimens during the acute phase of infection. Negative results do not preclude SARS-CoV-2 infection, do not rule out co-infections with other pathogens, and should not be used as the sole basis for treatment or other patient management decisions. Negative results must be combined with clinical observations, patient history, and epidemiological information. The expected result is Negative. Fact Sheet for Patients: SugarRoll.be Fact Sheet for Healthcare Providers: https://www.woods-mathews.com/ This test is not yet approved or cleared by the [place]  FDA and  has been authorized for detection and/or diagnosis of SARS-CoV-2 by FDA under an Emergency Use Authorization (EUA). This EUA will remain  in effect (meaning this test can be used) for the duration of the COVID-19 declaration under Section 56 4(b)(1) of the Act, 21 U.S.C. section 360bbb-3(b)(1), unless the authorization is terminated or revoked sooner. Performed at [facility], [place] [address] [place], [place] [address]   MRSA PCR Screening     Status: None   Collection Time: 07/17/19 10:00 PM   Specimen: Nasal Mucosa; Nasopharyngeal  Result Value Ref Range Status   MRSA by PCR NEGATIVE NEGATIVE Final    Comment:        The GeneXpert MRSA Assay (FDA approved for NASAL specimens only), is one component of a comprehensive MRSA colonization surveillance program. It is not intended to diagnose MRSA infection nor to guide or monitor treatment for MRSA infections. Performed at [facility], [address], [place], [place] [address]   Novel Coronavirus, NAA (hospital order; send-out to ref lab)     Status: None   Collection Time: 07/21/19 11:00 AM   Specimen: Nasopharyngeal Swab; Respiratory  Result Value Ref Range Status   SARS-CoV-2, NAA NOT DETECTED NOT DETECTED Final    Comment: (NOTE) This nucleic acid amplification test was developed and its performance characteristics determined by [employer]. Nucleic acid amplification tests include PCR and TMA. This test has not been FDA cleared or approved. This test has been authorized by FDA under an Emergency Use Authorization (EUA). This test is only authorized for the duration of time the declaration that circumstances exist justifying the authorization of the emergency use of in vitro diagnostic tests for detection of SARS-CoV-2 virus and/or diagnosis of COVID-19 infection under section 564(b)(1) of the Act, 21 U.S.C. 528UXL-2(G) (1), unless the authorization is terminated or revoked sooner. When  diagnostic testing is negative, the possibility of a false negative result should be considered in the context of a patient's recent exposures and the presence of clinical signs and symptoms consistent with COVID-19. An individual without symptoms of COVID- 19 and who is not shedding SARS-CoV-2 vi rus would expect to have a negative (not detected) result in this assay. Performed At: [facility] [address] [place], [place] [id number] Katina Degree MDPhD [id number]    Coronavirus Source NASOPHARYNGEAL  Final    Comment: Performed at [facility], [address] [place], [place] [address]     Scheduled Meds: . sodium chloride   Intravenous Once  . allopurinol  100 mg Oral BID  . atorvastatin  40 mg Oral QHS  . Chlorhexidine Gluconate Cloth  6 each Topical Daily  . ciprofloxacin  500 mg Oral Daily  . darbepoetin (ARANESP) injection - NON-DIALYSIS  150 mcg Subcutaneous Q Thu-1800  . famotidine  20 mg Oral QHS  . umeclidinium bromide  1 puff  Inhalation Daily   And  . fluticasone furoate-vilanterol  1 puff Inhalation Daily  . insulin aspart  0-9 Units Subcutaneous TID WC  . insulin glargine  5 Units Subcutaneous QHS  . metoprolol tartrate  25 mg Oral BID  . pantoprazole  40 mg Oral BID AC  . sertraline  50 mg Oral Daily  . sodium chloride flush  3 mL Intravenous Q12H   Continuous Infusions: . sodium chloride    . sodium chloride 50 mL/hr at 07/24/19 0824  . amiodarone 30 mg/hr (07/24/19 1055)  . diltiazem (CARDIZEM) infusion Stopped (07/20/19 1028)    Procedures/Studies: CT ABDOMEN PELVIS WO CONTRAST  Result Date: 07/17/2019 CLINICAL DATA:  Abdominal pain, nausea and vomiting EXAM: CT ABDOMEN AND PELVIS WITHOUT CONTRAST TECHNIQUE: Multidetector CT imaging of the abdomen and pelvis was performed following the standard protocol without IV contrast. COMPARISON:  None. FINDINGS: Lower chest: Increased bibasilar bandlike opacities consistent with atelectasis. Right middle lobe  indeterminate small nodules noted, series 4, images 3 and 7. These measure 5 mm. Stable cardiomegaly. No pericardial or pleural effusion. Large hiatal hernia again noted with the majority of the stomach in the chest. Hepatobiliary: Limited without IV contrast. Remote cholecystectomy. No intrahepatic biliary dilatation or large focal hepatic abnormality. Pancreas: Unremarkable. No pancreatic ductal dilatation or surrounding inflammatory changes. Spleen: Normal in size without focal abnormality. Adrenals/Urinary Tract: Normal adrenal glands. Kidneys demonstrate mild chronic perinephric strandy edema. Renal obstruction or hydronephrosis. No hydroureter or obstructing ureteral calculus adjacent chronic calcified gonadal vein phleboliths noted on the left. Urinary bladder unremarkable. Stomach/Bowel: Large hiatal hernia again noted. Majority of stomach in the chest. Negative for obstruction, significant dilatation, ileus, or free air. Extensive colonic diverticulosis without acute focal inflammatory process or wall thickening. No fluid collection, hemorrhage, hematoma, abscess or ascites. Vascular/Lymphatic: Abdominal atherosclerosis noted. Negative for aneurysm. No retroperitoneal hemorrhage or hematoma. No bulky adenopathy. Reproductive: Status post hysterectomy. No adnexal masses. Other: No abdominal wall hernia or abnormality. No abdominopelvic ascites. Musculoskeletal: Remote lumbar spinal fusion. No acute osseous finding. Improved body anasarca compared to the prior study. IMPRESSION: Stable large hiatal hernia with majority of the stomach in the chest. Increased bibasilar atelectasis Right middle lobe 5 mm pulmonary nodules remain indeterminate. No follow-up needed if patient is low-risk. Non-contrast chest CT can be considered in 12 months if patient is high-risk. This recommendation follows the consensus statement: Guidelines for Management of Incidental Pulmonary Nodules Detected on CT Images: From the  Fleischner Society 2017; Radiology 2017; 284:228-243. Diverticulosis without acute inflammatory process Improved body anasarca Electronically Signed   By: Jerilynn Mages.  Shick M.D.   On: 07/17/2019 13:06   DG Chest 2 View  Result Date: 07/05/2019 CLINICAL DATA:  Congestion and shortness of breath EXAM: CHEST - 2 VIEW COMPARISON:  Chest radiograph July 04, 2019 FINDINGS: Port-A-Cath tip is in the superior vena cava. No pneumothorax. There is mild interstitial prominence with suspected mild interstitial edema. No consolidation. Heart is mildly enlarged with pulmonary venous hypertension. No evident adenopathy. 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 There is degenerative change in the thoracic spine. IMPRESSION: There is a degree of pulmonary vascular congestion with mild interstitial edema. No consolidation. There may be a degree of congestive heart failure. Sizable paraesophageal hernia noted. Port-A-Cath tip in superior vena cava. Electronically Signed   By: Lowella Grip III M.D.   On: 07/05/2019 10:52   CT Head Wo Contrast  Result Date: 07/05/2019 CLINICAL DATA:  Unexplained altered level of consciousness. EXAM: CT HEAD WITHOUT CONTRAST  TECHNIQUE: Contiguous axial images were obtained from the base of the skull through the vertex without intravenous contrast. COMPARISON:  06/27/2015 FINDINGS: Brain: Age related atrophy. Mild chronic small-vessel change of the hemispheric white matter and pons. 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 No sign of mass lesion, hemorrhage, hydrocephalus or extra-axial collection. Vascular: There is atherosclerotic calcification of the major vessels at the base of the brain. Skull: Negative Sinuses/Orbits: Paranasal sinuses are clear. Orbits are negative. Mastoid effusion on the right. Other: None IMPRESSION: No acute intracranial finding. Atrophy and chronic small-vessel ischemic changes as seen previously. Electronically Signed   By: Nelson Chimes M.D.   On:  07/05/2019 11:04   US RENAL  Result Date: 07/06/2019 CLINICAL DATA:  Acute kidney injury. EXAM: RENAL / URINARY TRACT ULTRASOUND COMPLETE COMPARISON:  Noncontrast abdominal CT 05/31/2019, renal ultrasound 09/10/2017 FINDINGS: Right Kidney: Renal measurements: 8.9 x 5.6 x 6.1 cm = volume: 160 mL. No hydronephrosis. There is renal cortical thinning with increased parenchymal echogenicity. Mild scarring. Simple cyst measuring 1.2 cm in the upper kidney again seen. No suspicious solid lesion. No shadowing calculi. Left Kidney: Renal measurements: 9.0 x 5.5 x 5.0 cm = volume: 132 mL. No hydronephrosis. There is thinning of renal parenchyma with increased renal echogenicity. Bladder: No bladder wall thickening. Both ureteral jets are visualized. Reported post void residual of 284 cc. Prevoid bladder volume not recorded. Other: None. IMPRESSION: 1. No hydronephrosis. 2. Thinning of bilateral renal parenchyma with increased renal echogenicity most consistent with chronic medical renal disease. 3. Post void residual of 284 cc. Electronically Signed   By: Keith Rake M.D.   On: 07/06/2019 12:45   DG Chest Portable 1 View  Result Date: 07/17/2019 CLINICAL DATA:  Vomiting, abdominal pain. EXAM: PORTABLE CHEST 1 VIEW COMPARISON:  July 05, 2019. FINDINGS: Stable cardiomediastinal silhouette. Right internal jugular Port-A-Cath is unchanged. No pneumothorax or pleural effusion is noted. No acute pulmonary disease is noted. Bony thorax is unremarkable. Stable hiatal hernia. IMPRESSION: No acute cardiopulmonary abnormality seen.  Stable hiatal hernia. Electronically Signed   By: Marijo Conception M.D.   On: 07/17/2019 10:53   DG Chest Portable 1 View  Result Date: 07/04/2019 CLINICAL DATA:  Altered mental status. Recent positive blood culture EXAM: PORTABLE CHEST 1 VIEW COMPARISON:  June 15, 2019 FINDINGS: Port-A-Cath tip is in the superior vena cava. No pneumothorax. There is no appreciable edema or  consolidation. There is slight atelectatic change in the left lower lobe. Heart is upper normal in size with pulmonary vascularity normal. No adenopathy. 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 No bone lesions. IMPRESSION: Large paraesophageal hernia. Mild left base atelectasis. No edema or consolidation. Stable cardiac silhouette. Port-A-Cath tip in superior vena cava. Electronically Signed   By: Lowella Grip III M.D.   On: 07/04/2019 11:26   DG Abd Portable 1 View  Result Date: 07/17/2019 CLINICAL DATA:  Weakness, vomiting. EXAM: PORTABLE ABDOMEN - 1 VIEW COMPARISON:  September 03, 2010. FINDINGS: The bowel gas pattern is normal. No radio-opaque calculi or other significant radiographic abnormality are seen. IMPRESSION: Negative. Electronically Signed   By: Marijo Conception M.D.   On: 07/17/2019 10:53    Orson Eva, DO  [facility] Pager [phone]  If 7PM-7AM, please contact night-coverage [email or web address] Password [email or web address] 07/24/2019, 11:57 AM   LOS: 6 days

## 2019-07-24 NOTE — Progress Notes (Signed)
Pt arrived via [facility] from [facility] after report received. Pt assisted to unit bed and introduced to staff. CHG 6 cloth wipe completed, previous MRSA swab (-) 3 days ago. Elink notified of Pts arrival. Pt without any immediate concerns, contacted Pt's son to set up password. Will continue to monitor and assess.

## 2019-07-24 NOTE — Progress Notes (Signed)
Bladder scan performed 143 mls urine detected.

## 2019-07-24 NOTE — Progress Notes (Signed)
Pt off the floor with [facility]. Called [facility] and spoke with Wille Glaser; report already given from dayshift nurse. No additional questions.

## 2019-07-24 NOTE — Progress Notes (Signed)
  Subjective:  Patient has no complaints.  She states she did not eat much breakfast because she did not like dry toast etc.  She denies chest pain shortness of breath or abdominal pain.  She had brown stool yesterday.  Objective: Blood pressure (!) 115/49, pulse (!) 139, temperature 98.2 F (36.8 C), temperature source Oral, resp. rate 18, height '4\' 11"'$  (1.499 m), weight 87.9 kg, SpO2 98 %. Patient is alert and in no acute distress. Conjunctiva is pale.  Sclerae nonicteric. Short neck but no adenopathy. Cardiac exam with irregular rhythm normal S1 and S2.  No murmur or gallop noted. Collation lungs reveal vesicular breath sounds bilaterally. Abdomen is full but soft and nontender with organomegaly or masses There is fine wrinkling to skin of legs suggesting resolution of pitting edema.   Labs/studies Results:  CBC Latest Ref Rng & Units 07/24/2019 07/23/2019 07/23/2019  WBC 4.0 - 10.5 K/uL 7.8 - 6.7  Hemoglobin 12.0 - 15.0 g/dL 8.7(L) 9.4(L) 6.9(LL)  Hematocrit 36.0 - 46.0 % 28.5(L) 30.4(L) 22.6(L)  Platelets 150 - 400 K/uL 226 - 244    CMP Latest Ref Rng & Units 07/24/2019 07/23/2019 07/22/2019  Glucose 70 - 99 mg/dL 111(H) 112(H) 113(H)  BUN 8 - 23 mg/dL 102(H) 105(H) 98(H)  Creatinine 0.44 - 1.00 mg/dL 6.97(H) 6.41(H) 5.55(H)  Sodium 135 - 145 mmol/L 135 136 139  Potassium 3.5 - 5.1 mmol/L 4.5 4.5 4.1  Chloride 98 - 111 mmol/L 95(L) 92(L) 93(L)  CO2 22 - 32 mmol/L 30 30 33(H)  Calcium 8.9 - 10.3 mg/dL 7.5(L) 7.8(L) 8.3(L)  Total Protein 6.5 - 8.1 g/dL - - -  Total Bilirubin 0.3 - 1.2 mg/dL - - -  Alkaline Phos 38 - 126 U/L - - -  AST 15 - 41 U/L - - -  ALT 0 - 44 U/L - - -    Hepatic Function Latest Ref Rng & Units 07/24/2019 07/23/2019 07/20/2019  Total Protein 6.5 - 8.1 g/dL - - 7.0  Albumin 3.5 - 5.0 g/dL 2.0(L) 1.9(L) 2.3(L)  AST 15 - 41 U/L - - 14(L)  ALT 0 - 44 U/L - - 9  Alk Phosphatase 38 - 126 U/L - - 64  Total Bilirubin 0.3 - 1.2 mg/dL - - 1.3(H)      Assessment:  #1.  Upper GI bleed secondary to erosive reflux esophagitis.  No evidence of active GI bleed.  Patient has received 2 units of PRBCs and she also received single dose of ferumoxytol.  Hemoglobin has dropped by less than a gram in the last 24 hours.  We will continue to monitor.  #2.  Acute on chronic kidney disease.  Renal function continued to deteriorate.  Dr. [name] Tat feels she may need CRRT until acute kidney injury improves.  #3.  Atrial fibrillation.  Anticoagulant on hold.  #4.  She also has CAD COPD diabetes mellitus and obesity.  Recommendations  Change pantoprazole to 40 mg by mouth twice daily. CBC in a.m. Apixaban can be resumed on 07/25/2019

## 2019-07-25 ENCOUNTER — Inpatient Hospital Stay (HOSPITAL_COMMUNITY): Payer: Medicare Other

## 2019-07-25 DIAGNOSIS — K922 Gastrointestinal hemorrhage, unspecified: Secondary | ICD-10-CM

## 2019-07-25 DIAGNOSIS — N179 Acute kidney failure, unspecified: Secondary | ICD-10-CM | POA: Diagnosis present

## 2019-07-25 LAB — BASIC METABOLIC PANEL
Anion gap: 16 — ABNORMAL HIGH (ref 5–15)
BUN: 103 mg/dL — ABNORMAL HIGH (ref 8–23)
CO2: 25 mmol/L (ref 22–32)
Calcium: 7.5 mg/dL — ABNORMAL LOW (ref 8.9–10.3)
Chloride: 93 mmol/L — ABNORMAL LOW (ref 98–111)
Creatinine, Ser: 7.98 mg/dL — ABNORMAL HIGH (ref 0.44–1.00)
GFR calc Af Amer: 5 mL/min — ABNORMAL LOW (ref >60)
GFR calc non Af Amer: 5 mL/min — ABNORMAL LOW (ref >60)
Glucose, Bld: 123 mg/dL — ABNORMAL HIGH (ref 70–99)
Potassium: 4.9 mmol/L (ref 3.5–5.1)
Sodium: 134 mmol/L — ABNORMAL LOW (ref 135–145)

## 2019-07-25 LAB — RENAL FUNCTION PANEL
Albumin: 2 g/dL — ABNORMAL LOW (ref 3.5–5.0)
Anion gap: 16 — ABNORMAL HIGH (ref 5–15)
BUN: 93 mg/dL — ABNORMAL HIGH (ref 8–23)
CO2: 23 mmol/L (ref 22–32)
Calcium: 7.6 mg/dL — ABNORMAL LOW (ref 8.9–10.3)
Chloride: 94 mmol/L — ABNORMAL LOW (ref 98–111)
Creatinine, Ser: 7.08 mg/dL — ABNORMAL HIGH (ref 0.44–1.00)
GFR calc Af Amer: 6 mL/min — ABNORMAL LOW (ref >60)
GFR calc non Af Amer: 5 mL/min — ABNORMAL LOW (ref >60)
Glucose, Bld: 116 mg/dL — ABNORMAL HIGH (ref 70–99)
Phosphorus: 6.9 mg/dL — ABNORMAL HIGH (ref 2.5–4.6)
Potassium: 5.4 mmol/L — ABNORMAL HIGH (ref 3.5–5.1)
Sodium: 133 mmol/L — ABNORMAL LOW (ref 135–145)

## 2019-07-25 LAB — PHOSPHORUS: Phosphorus: 7.3 mg/dL — ABNORMAL HIGH (ref 2.5–4.6)

## 2019-07-25 LAB — GLUCOSE, CAPILLARY
Glucose-Capillary: 93 mg/dL (ref 70–99)
Glucose-Capillary: 92 mg/dL (ref 70–99)
Glucose-Capillary: 104 mg/dL — ABNORMAL HIGH (ref 70–99)
Glucose-Capillary: 137 mg/dL — ABNORMAL HIGH (ref 70–99)

## 2019-07-25 LAB — MAGNESIUM: Magnesium: 1.4 mg/dL — ABNORMAL LOW (ref 1.7–2.4)

## 2019-07-25 LAB — TYPE AND SCREEN
ABO/RH(D): O POS
Antibody Screen: NEGATIVE

## 2019-07-25 MED ORDER — CIPROFLOXACIN IN D5W 400 MG/200ML IV SOLN
400.0000 mg | Freq: Two times a day (BID) | INTRAVENOUS | Status: DC
  Administered 2019-07-25 – 2019-07-29 (×8): 400 mg via INTRAVENOUS
  Filled 2019-07-25 (×10): qty 200

## 2019-07-25 MED ORDER — PRISMASOL BGK 4/2.5 32-4-2.5 MEQ/L REPLACEMENT SOLN: Status: DC

## 2019-07-25 MED ORDER — PRISMASOL BGK 4/2.5 32-4-2.5 MEQ/L REPLACEMENT SOLN
Status: DC
  Administered 2019-07-26: 500 mL via INTRAVENOUS_CENTRAL

## 2019-07-25 MED ORDER — SODIUM CHLORIDE 0.9% FLUSH
10.0000 mL | INTRAVENOUS | Status: DC | PRN
  Administered 2019-07-25: 10 mL

## 2019-07-25 MED ORDER — HEPARIN SODIUM (PORCINE) 1000 UNIT/ML DIALYSIS
1000.0000 [IU] | INTRAMUSCULAR | Status: DC | PRN
  Administered 2019-07-27 (×2): 2800 [IU] via INTRAVENOUS_CENTRAL
  Administered 2019-07-29: 3800 [IU] via INTRAVENOUS_CENTRAL
  Filled 2019-07-25 (×3): qty 6
  Filled 2019-07-25: qty 3
  Filled 2019-07-25: qty 6

## 2019-07-25 MED ORDER — HEPARIN (PORCINE) 2000 UNITS/L FOR CRRT
INTRAVENOUS_CENTRAL | Status: DC | PRN
  Filled 2019-07-25 (×4): qty 1000

## 2019-07-25 MED ORDER — ALLOPURINOL 100 MG PO TABS
100.0000 mg | ORAL_TABLET | Freq: Every day | ORAL | Status: DC
  Administered 2019-07-25 – 2019-08-11 (×17): 100 mg via ORAL
  Filled 2019-07-25 (×17): qty 1

## 2019-07-25 MED ORDER — PRISMASOL BGK 4/2.5 32-4-2.5 MEQ/L IV SOLN
INTRAVENOUS | Status: DC
  Administered 2019-07-25 – 2019-07-26 (×3): 1000 mL/h via INTRAVENOUS_CENTRAL

## 2019-07-25 MED ORDER — SODIUM CHLORIDE 0.9% FLUSH
10.0000 mL | Freq: Two times a day (BID) | INTRAVENOUS | Status: DC
  Administered 2019-07-25 – 2019-07-26 (×3): 10 mL
  Administered 2019-07-27: 20 mL
  Administered 2019-07-28: 10 mL
  Administered 2019-07-28: 20 mL
  Administered 2019-07-29: 10 mL

## 2019-07-25 MED ORDER — SODIUM CHLORIDE 0.9% FLUSH: 10.0000 mL | INTRAVENOUS | Status: DC | PRN

## 2019-07-25 MED ORDER — SODIUM CHLORIDE 0.9% FLUSH: 10.0000 mL | Freq: Two times a day (BID) | INTRAVENOUS | Status: DC

## 2019-07-25 MED ORDER — MAGNESIUM SULFATE IN D5W 1-5 GM/100ML-% IV SOLN
1.0000 g | Freq: Once | INTRAVENOUS | Status: AC
  Administered 2019-07-25: 1 g via INTRAVENOUS
  Filled 2019-07-25: qty 100

## 2019-07-25 MED ORDER — HEPARIN SODIUM (PORCINE) 5000 UNIT/ML IJ SOLN
5000.0000 [IU] | Freq: Three times a day (TID) | INTRAMUSCULAR | Status: DC
  Administered 2019-07-25 – 2019-07-28 (×8): 5000 [IU] via SUBCUTANEOUS
  Filled 2019-07-25 (×9): qty 1

## 2019-07-25 NOTE — Procedures (Addendum)
Hemodialysis Catheter Insertion Procedure Note [name] [id number] [birth year]  Procedure: Insertion of Hemodialysis Catheter Indications: Dialysis Access   Procedure Details Consent: Risks of procedure as well as the alternatives and risks of each were explained to the (patient/caregiver).  Consent for procedure obtained. Time Out: Verified patient identification, verified procedure, site/side was marked, verified correct patient position, special equipment/implants available, medications/allergies/relevent history reviewed, required imaging and test results available.  Performed  Maximum sterile technique was used including antiseptics, cap, gloves, gown, hand hygiene, mask and sheet. Skin prep: Chlorhexidine; local anesthetic administered. Under ultrasound guidance right femoral vein visualized, compressible and accessed as below.  Triple lumen hemodialysis catheter was inserted into right femoral vein due R IJ attempt and no other available access using the Seldinger technique.  Evaluation Blood flow good Complications: No apparent complications Patient did tolerate procedure well.    Alfonzo Feller 07/25/2019

## 2019-07-25 NOTE — Procedures (Signed)
Hemodialysis Catheter Insertion Procedure Note KERRIA SAPIEN [id number] 1945/11/11  Procedure: Insertion of Hemodialysis Catheter Indications: Dialysis Access   Procedure Details Consent: Risks of procedure as well as the alternatives and risks of each were explained to the (patient/caregiver).  Consent for procedure obtained. Time Out: Verified patient identification, verified procedure, site/side was marked, verified correct patient position, special equipment/implants available, medications/allergies/relevent history reviewed, required imaging and test results available.  Performed  Maximum sterile technique was used including antiseptics, cap, gloves, gown, hand hygiene, mask and sheet. Skin prep: Chlorhexidine; local anesthetic administered.  Under ultrasound guidance attempted to place  Triple lumen hemodialysis catheter into right internal jugular vein using the Seldinger technique. Vein accessed /s difficulty with good blood return. Unable to pass wire d/t existing porta cath. Procedure ultimately abandoned. Will place femoral as will encounter same challenge on L IJ. D/W Dr Chase Caller.   Evaluation Blood flow good Complications: Complications of unable to pass wire  Patient did tolerate procedure well. Chest X-ray ordered to verify placement.  CXR: pending.   Alfonzo Feller 07/25/2019

## 2019-07-25 NOTE — Progress Notes (Signed)
Patient ID: REWA WEISSBERG, female   DOB: July 05, 1946, 73 y.o.   MRN: [id number] S: Pt transferred from [facility] to [facility] ICU.  No new complaints. O:BP 100/61 (BP Location: Left Arm)   Pulse (!) 103   Temp 97.9 F (36.6 C) (Oral)   Resp (!) 21   Ht 4\' 8"  (1.422 m)   Wt 87.6 kg   SpO2 95%   BMI 43.30 kg/m   Intake/Output Summary (Last 24 hours) at 07/25/2019 0943 Last data filed at 07/25/2019 0800 Gross per 24 hour  Intake 1136.63 ml  Output 0 ml  Net 1136.63 ml   Intake/Output: I/O last 3 completed shifts: In: 1850.2 [I.V.:1850.2] Out: 10 [Urine:10]  Intake/Output this shift:  Total I/O In: 132.9 [P.O.:60; I.V.:49.8; IV Piggyback:23.1] Out: 0  Weight change: -0.3 kg Gen: chronically ill appearing, obese WF CVS: tachycardic Resp: cta Abd: +BS,soft, NT/ND Ext: no edema  Recent Labs  Lab 07/20/19 0722 07/22/19 0350 07/23/19 0351 07/24/19 0424 07/25/19 0257  NA 143 139 136 135 134*  K 4.1 4.1 4.5 4.5 4.9  CL 96* 93* 92* 95* 93*  CO2 33* 33* 30 30 25   GLUCOSE 148* 113* 112* 111* 123*  BUN 87* 98* 105* 102* 103*  CREATININE 3.37* 5.55* 6.41* 6.97* 7.98*  ALBUMIN 2.3*  --  1.9* 2.0*  --   CALCIUM 8.6* 8.3* 7.8* 7.5* 7.5*  PHOS  --   --  5.6* 6.3* 7.3*  AST 14*  --   --   --   --   ALT 9  --   --   --   --    Liver Function Tests: Recent Labs  Lab 07/20/19 0722 07/23/19 0351 07/24/19 0424  AST 14*  --   --   ALT 9  --   --   ALKPHOS 64  --   --   BILITOT 1.3*  --   --   PROT 7.0  --   --   ALBUMIN 2.3* 1.9* 2.0*   No results for input(s): LIPASE, AMYLASE in the last 168 hours. No results for input(s): AMMONIA in the last 168 hours. CBC: Recent Labs  Lab 07/20/19 0722 07/22/19 0350 07/23/19 0351 07/23/19 1843 07/24/19 0425  WBC 8.1 7.0 6.7  --  7.8  HGB 8.6* 7.3* 6.9* 9.4* 8.7*  HCT 28.6* 24.0* 22.6* 30.4* 28.5*  MCV 92.0 91.6 91.5  --  88.8  PLT 318 286 244  --  226   Cardiac Enzymes: No results for input(s): CKTOTAL, CKMB, CKMBINDEX, TROPONINI  in the last 168 hours. CBG: Recent Labs  Lab 07/24/19 1148 07/24/19 1637 07/24/19 2059 07/24/19 2305 07/25/19 0646  GLUCAP 111* 104* 112* 103* 93    Iron Studies:  Recent Labs    07/23/19 0351  IRON 13*  TIBC 153*  FERRITIN 84   Studies/Results: No results found. . sodium chloride   Intravenous Once  . allopurinol  100 mg Oral Daily  . atorvastatin  40 mg Oral QHS  . Chlorhexidine Gluconate Cloth  6 each Topical Daily  . ciprofloxacin  500 mg Oral Daily  . darbepoetin (ARANESP) injection - NON-DIALYSIS  150 mcg Subcutaneous Q Thu-1800  . famotidine  20 mg Oral QHS  . umeclidinium bromide  1 puff Inhalation Daily   And  . fluticasone furoate-vilanterol  1 puff Inhalation Daily  . heparin  5,000 Units Subcutaneous Q8H  . insulin aspart  0-9 Units Subcutaneous TID WC  . insulin glargine  5 Units Subcutaneous QHS  .  metoprolol tartrate  25 mg Oral BID  . pantoprazole  40 mg Oral BID AC  . sertraline  50 mg Oral Daily  . sodium chloride flush  10-40 mL Intracatheter Q12H  . sodium chloride flush  3 mL Intravenous Q12H    BMET    Component Value Date/Time   NA 134 (L) 07/25/2019 0257   K 4.9 07/25/2019 0257   CL 93 (L) 07/25/2019 0257   CO2 25 07/25/2019 0257   GLUCOSE 123 (H) 07/25/2019 0257   BUN 103 (H) 07/25/2019 0257   CREATININE 7.98 (H) 07/25/2019 0257   CREATININE 1.68 (H) 10/10/2017 1050   CALCIUM 7.5 (L) 07/25/2019 0257   GFRNONAA 5 (L) 07/25/2019 0257   GFRNONAA 30 (L) 10/10/2017 1050   GFRAA 5 (L) 07/25/2019 0257   GFRAA 35 (L) 10/10/2017 1050   CBC    Component Value Date/Time   WBC 7.8 07/24/2019 0425   RBC 3.21 (L) 07/24/2019 0425   HGB 8.7 (L) 07/24/2019 0425   HCT 28.5 (L) 07/24/2019 0425   PLT 226 07/24/2019 0425   MCV 88.8 07/24/2019 0425   MCH 27.1 07/24/2019 0425   MCHC 30.5 07/24/2019 0425   RDW 17.0 (H) 07/24/2019 0425   LYMPHSABS 0.7 07/17/2019 0940   MONOABS 0.6 07/17/2019 0940   EOSABS 0.1 07/17/2019 0940   BASOSABS 0.0  07/17/2019 0940      Assessment/Plan:  1. AKI/CKD stage 4- in setting of atrial fibrillation, hypotension, h/o NSAIDs, and ABLA due to UGIB.  She remains oliguric.  Also with h/o bladder outlet obstruction.   1. Continue to follow bladder scans and if >300 place foley catheter. 2. No significant improvement overnight.   3. Will plan to initiate CVVHD today after HD cath placement by PCCM 2. Hematemesis/UGIB- seen by GI and EGD with erosive reflux esophagitis.  Transfused 2 units PRBC's on 07/23/19.  No heparin with CVVHD. 3. Atrial fibrillation with RVR and ongoing hypotension- on amiodarone drip.  Decreased metoprolol.  Off apixaban until 07/25/19 due to GIB.  Cardiology following.  4. UTI - on cipro  Donetta Potts, MD [employer] [phone]

## 2019-07-25 NOTE — H&P (Signed)
NAME:  [name], MRN:  [id number], DOB:  October 15, 1945, LOS: 7 ADMISSION DATE:  07/17/2019, CONSULTATION DATE:  07/25/2019 REFERRING MD:  Forestine Na, CHIEF COMPLAINT:  AKI   Brief History   73 yo F who was admitted for anemia, UGIB, with acute on chronic kidney disease, worsening to the point of needing CRRT.  Transferred from Forestine Na to [facility] ICU for consideration for CRRT.    History of present illness   73 yo F with a history of diastolic heart failure, CKD stage IIIb, COPD, chronic respiratory failure, obesity, OSA, Afib (on chronic anticoagulation), GERD, hiatal hernia, gout, DM2, and chronic anemia who presented to [facility] on 12/12 after an episode of coffee ground emesis, nausea and vomiting.  She had an EGD done that showed esophagitis but no active bleeding ulcers.  She was also started on ciprofloaxinc for a UTI.  Unfortunately, throughout her hospital stay, he developed afib with RVR, started on amiodarone, and has had progressive renal failure, and has been anuric the last 24 hours.  She was transferred from [facility] to [facility] for consideration for CRRT.    At the bedside, patient is mentating appropriately and is in no distress.  Denies any significant shortness of breath.    Past Medical History   Past Medical History:  Diagnosis Date  . Blood transfusion without reported diagnosis   . CAD (coronary artery disease)    Nonobstructive at cardiac catheterization 2000  . Cataract   . Cervical cancer ([place]) 1978  . CHF (congestive heart failure) ([place])   . Chronic back pain   . Chronic kidney disease   . COPD (chronic obstructive pulmonary disease) ([place])   . Degenerative disc disease   . Essential hypertension, benign   . GERD (gastroesophageal reflux disease)   . Gout   . Hiatal hernia 07/27/2013  . History of diverticulitis of colon   . History of hiatal hernia   . Iron deficiency anemia   . Irritable bowel syndrome   . Lumbar radiculopathy   . Mixed  hyperlipidemia   . Moderate major depression, single episode ([place]) 07/21/2019  . Neuropathy   . Osteoporosis   . Ovarian cancer [facility]) 1978   patient denies. States this was cervical cancer  . Oxygen deficiency   . Sleep apnea   . Type 2 diabetes mellitus ([place])   . Vitamin B deficiency 12/25/2009  . Vitamin B12 deficiency      Significant Hospital Events   12/14 EGD: esophagitis, Schatzki ring dilation 12/15 Afib with RVR: started on amiodarone 12/16: AKI on CKD, renal consulted initially  Consults:  Cardiology Nephrology GI  Procedures:  EGD 2/14  Significant Diagnostic Tests:  12/12 CT A/P without contrast: Stable large hiatal hernia with majority of the stomach in the chest.  Increased bibasilar atelectasis  Right middle lobe 5 mm pulmonary nodules remain indeterminate.  No follow-up needed if patient is low-risk. Non-contrast chest CT can be considered in 12 months if patient is high-risk. This recommendation follows the consensus statement: Guidelines for Management of Incidental Pulmonary Nodules Detected on CT Images: From the Fleischner Society 2017; Radiology 2017; 284:228-243.  Diverticulosis without acute inflammatory process  Improved body anasarca  Micro Data:  COVID-19 12/16--> negative Urine culture 12/12--> enterobacter aerogenes.   Antimicrobials:  Ciprofloxacin started 12/17  Interim history/subjective:    Objective   Blood pressure (!) 100/47, pulse (!) 110, temperature 98.3 F (36.8 C), temperature source Oral, resp. rate 17, height 4\' 8"  (  1.422 m), weight 87.6 kg, SpO2 94 %.        Intake/Output Summary (Last 24 hours) at 07/25/2019 0056 Last data filed at 07/25/2019 0000 Gross per 24 hour  Intake 1695.4 ml  Output 0 ml  Net 1695.4 ml   Filed Weights   07/23/19 0500 07/24/19 0410 07/24/19 2130  Weight: 89.7 kg 87.9 kg 87.6 kg    Examination: General: alert and oriented x3 HENT: no abnormalities Lungs: clear  bilaterally, no wheezes Cardiovascular: irregular rate, in 90s Abdomen: soft, nontender, nondistended Extremities: 2+ pitting edema bilaterally, ulcer on left leg Neuro: no asterixis, no focal deficits GU: no abnormalities  Resolved Hospital Problem list     Assessment & Plan:  73 yo F who presented with UGIB due to esophagitis in the setting of GERD and meloxicam use, hospital course complicated with Afib with RVR, and now with worsening AKI on CKD, transferred from [facility] Na to [facility] for consideration for CRRT.    # AKI on CKD:  Likely is ATN precipitated by episode of poor perfusion with Afib with RVR episode on 12/15.  Uremia is worsening, but patient is currently mentating appropriately.  No acute needs for CRRT or HD tonight. - renal consult in AM - will closely monitor BMP   # Afib:  Currently rate controlled and hemodynamically stable on amiodarone gtt - continue amio gtt  - will hold off on therapeutic AC for now since had recent GI bleed.  Likely start heparin gtt tomorrow  # Acute on chronic anemia # Iron deficiency anemia # UGIB due to esophagitis - ppi and pepcid - iron supplementation on discharge - frequent CBCs - started DVT ppx, can think about resuming therapeutic AC tomorrow  # UTI, enterobacter aerogenes - cipro for 7 days (day 3) - currently renally dosed at 500mg  QD  # Chronic diastolic CHF: - holding torsemide, followup with nephrology - currently euvolemic  # COPD: no exacerbation, continue home inhalers and O2  # Gout: continue allopurinol, at reduced dose   Best practice:  Diet: Nephro  Pain/Anxiety/Delirium protocol (if indicated): n/a VAP protocol (if indicated): n/a DVT prophylaxis: SQH GI prophylaxis: ppi and pepcid Glucose control: lantus 5 and SSI Mobility: PT ordered Code Status: Code status Family Communication: did not discuss with family today Disposition:   Labs   CBC: Recent Labs  Lab 07/18/19 0803 07/20/19 0722  07/22/19 0350 07/22/19 1143 07/23/19 0351 07/23/19 1843 07/24/19 0425  WBC 7.9 8.1 7.0  --  6.7  --  7.8  HGB 8.1* 8.6* 7.3* 7.6* 6.9* 9.4* 8.7*  HCT 26.5* 28.6* 24.0* 25.0* 22.6* 30.4* 28.5*  MCV 90.1 92.0 91.6  --  91.5  --  88.8  PLT 285 318 286  --  244  --  993    Basic Metabolic Panel: Recent Labs  Lab 07/18/19 0803 07/20/19 0722 07/22/19 0350 07/23/19 0351 07/24/19 0424  NA 143 143 139 136 135  K 4.2 4.1 4.1 4.5 4.5  CL 99 96* 93* 92* 95*  CO2 34* 33* 33* 30 30  GLUCOSE 111* 148* 113* 112* 111*  BUN 94* 87* 98* 105* 102*  CREATININE 3.36* 3.37* 5.55* 6.41* 6.97*  CALCIUM 8.0* 8.6* 8.3* 7.8* 7.5*  MG  --   --  1.6*  --   --   PHOS  --   --   --  5.6* 6.3*   GFR: Estimated Creatinine Clearance: 6.3 mL/min (A) (by C-G formula based on SCr of 6.97 mg/dL (H)).  Recent Labs  Lab 07/20/19 0722 07/22/19 0350 07/23/19 0351 07/24/19 0425  WBC 8.1 7.0 6.7 7.8    Liver Function Tests: Recent Labs  Lab 07/20/19 0722 07/23/19 0351 07/24/19 0424  AST 14*  --   --   ALT 9  --   --   ALKPHOS 64  --   --   BILITOT 1.3*  --   --   PROT 7.0  --   --   ALBUMIN 2.3* 1.9* 2.0*   No results for input(s): LIPASE, AMYLASE in the last 168 hours. No results for input(s): AMMONIA in the last 168 hours.  ABG    Component Value Date/Time   PHART 7.304 (L) 06/07/2019 1415   PCO2ART 65.6 (HH) 06/07/2019 1415   PO2ART 82.0 (L) 06/07/2019 1415   HCO3 28.2 (H) 06/07/2019 1415   TCO2 25.1 06/30/2013 1010   ACIDBASEDEF 3.8 (H) 01/30/2007 1830   O2SAT 95.0 06/07/2019 1415     Coagulation Profile: No results for input(s): INR, PROTIME in the last 168 hours.  Cardiac Enzymes: No results for input(s): CKTOTAL, CKMB, CKMBINDEX, TROPONINI in the last 168 hours.  HbA1C: Hgb A1c MFr Bld  Date/Time Value Ref Range Status  07/05/2019 07:55 PM 6.8 (H) 4.8 - 5.6 % Final    Comment:    (NOTE)         Prediabetes: 5.7 - 6.4         Diabetes: >6.4         Glycemic control for  adults with diabetes: <7.0   07/13/2018 11:23 AM 6.7 (H) 4.8 - 5.6 % Final    Comment:    (NOTE) Pre diabetes:          5.7%-6.4% Diabetes:              >6.4% Glycemic control for   <7.0% adults with diabetes     CBG: Recent Labs  Lab 07/24/19 0746 07/24/19 1148 07/24/19 1637 07/24/19 2059 07/24/19 2305  GLUCAP 105* 111* 104* 112* 103*    Review of Systems:   No significant ROS except per HPI  Past Medical History  She,  has a past medical history of Blood transfusion without reported diagnosis, CAD (coronary artery disease), Cataract, Cervical cancer ([place]) (1978), CHF (congestive heart failure) ([place]), Chronic back pain, Chronic kidney disease, COPD (chronic obstructive pulmonary disease) ([place]), Degenerative disc disease, Essential hypertension, benign, GERD (gastroesophageal reflux disease), Gout, Hiatal hernia (07/27/2013), History of diverticulitis of colon, History of hiatal hernia, Iron deficiency anemia, Irritable bowel syndrome, Lumbar radiculopathy, Mixed hyperlipidemia, Moderate major depression, single episode ([place]) (07/21/2019), Neuropathy, Osteoporosis, Ovarian cancer ([place]) (1978), Oxygen deficiency, Sleep apnea, Type 2 diabetes mellitus ([place]), Vitamin B deficiency (12/25/2009), and Vitamin B12 deficiency.   Surgical History    Past Surgical History:  Procedure Laterality Date  . ABDOMINAL HYSTERECTOMY    . BACK SURGERY    . Benign breast cysts    . CHOLECYSTECTOMY    . COLONOSCOPY  10/01/2006   SLF:Pan colonic diverticulosis and moderate internal hemorrhoids/ Otherwise no polyps, masses, inflammatory changes or AVMs/  . COLONOSCOPY  2011   SLF: pancolonic diverticulosis, large internal hemorrhoids  . COLONOSCOPY N/A 01/26/2016   Procedure: COLONOSCOPY;  Surgeon: [name], MD;  Location: [facility] ENDO SUITE;  Service: Endoscopy;  Laterality: N/A;  830   . ESOPHAGOGASTRODUODENOSCOPY  11/19/2006   SLF: Large hiatal hernia without evidence of Cameron ulcers/.  Distal esophageal stricture, which allowed the gastroscope to pass without resistance.  A 16  mm Savary later passed with mild resistance/ Normal stomach.sb bx negative  . ESOPHAGOGASTRODUODENOSCOPY  10/01/2006   ZMO:QHUTM hiatal hernia.  Distal esophagus without evidence of   erythema, ulceration or Barrett's esophagus  . ESOPHAGOGASTRODUODENOSCOPY  2011   SLF: large hh, distal esophageal web narrowing to 26mm s/p dilation to 67mm  . ESOPHAGOGASTRODUODENOSCOPY N/A 08/06/2013   SLF: 1. Stricture at the gastroesophageal junction 2. large hiatal hernia. 3. Mild erosive gastritis.  Marland Kitchen ESOPHAGOGASTRODUODENOSCOPY (EGD) WITH PROPOFOL N/A 07/19/2019   Procedure: ESOPHAGOGASTRODUODENOSCOPY (EGD) WITH POSSIBLE ESOPHAGEAL DILATION WITH PROPOFOL;  Surgeon: Daneil Dolin, MD;  Location: [facility] ENDO SUITE;  Service: Endoscopy;  Laterality: N/A;  . GIVENS CAPSULE STUDY N/A 08/06/2013   INCOMPLETE-SMALL BOWLE ULCERS  . KNEE SURGERY Right   . PARTIAL HYSTERECTOMY  1978  . small bowel capsule  2008   negative  . SPINE SURGERY    . TONSILLECTOMY AND ADENOIDECTOMY    . Two back surgeries/fusion    . UMBILICAL HERNIA REPAIR  2010     Social History   reports that she quit smoking about 58 years ago. Her smoking use included cigarettes. She started smoking about 58 years ago. She has a 1.00 pack-year smoking history. She has never used smokeless tobacco. She reports that she does not drink alcohol or use drugs.   Family History   Her family history includes Asthma in her mother and sister; Cervical cancer in her mother; Colon cancer in her brother; Diabetes in her brother, mother, and sister; Early death in her father; Heart attack in her brother, maternal grandmother, mother, sister, and another family member; Heart disease in her brother and mother; Kidney failure in her sister; Stroke in her maternal grandmother, mother, and sister; Ulcers in her mother and sister.   Allergies No Known Allergies   Home  Medications  Prior to Admission medications   Medication Sig Start Date End Date Taking? Authorizing Provider  albuterol (PROVENTIL) (2.5 MG/3ML) 0.083% nebulizer solution Take 2.5 mg by nebulization every 6 (six) hours as needed for wheezing or shortness of breath.  04/09/19  Yes [provider]  albuterol (VENTOLIN HFA) 108 (90 Base) MCG/ACT inhaler Inhale 2 puffs into the lungs every 6 (six) hours as needed for wheezing or shortness of breath.  04/09/19  Yes [provider]  fluconazole (DIFLUCAN) 50 MG tablet Take 50 mg by mouth daily. Started on 07/15/19 per Nurse at [facility]   Yes [provider]  mirtazapine (REMERON) 15 MG tablet Take 15 mg by mouth at bedtime.   Yes [provider]  NONFORMULARY OR COMPOUNDED ITEM May use CPaP from home at previous home settings while asleep. Every Shift Day, Evening, Night   Yes [provider]  OXYGEN Inhale 2 L into the lungs. Every Shift   Yes [provider]  allopurinol (ZYLOPRIM) 100 MG tablet Take 100 mg by mouth 2 (two) times daily.  10/09/12   [provider]  apixaban (ELIQUIS) 5 MG TABS tablet Take 1 tablet (5 mg total) by mouth 2 (two) times daily. 06/21/19   Johnson, Clanford L, MD  aspirin EC 81 MG tablet Take 81 mg by mouth daily.    [provider]  atorvastatin (LIPITOR) 40 MG tablet Take 1 tablet (40 mg total) by mouth at bedtime. 05/04/18   Satira Sark, MD  Biotin 5 MG CAPS Take 5 mg by mouth daily.    [provider]  diltiazem (CARDIZEM CD) 120 MG 24 hr capsule Take  1 capsule (120 mg total) by mouth daily. 06/22/19   Johnson, Clanford L, MD  esomeprazole (NEXIUM) 40 MG capsule TAKE 1 CAPSULE TWICE A DAY 30 MINUTES PRIOR TO MEALS 01/19/18   Annitta Needs, NP  famotidine (PEPCID) 40 MG tablet TAKE 1 TABLET AT BEDTIME AS NEEDED TO CONTROL REFLUX Patient not taking: Reported on 07/19/2019 06/23/15   Mannam, Hart Robinsons, MD  feeding supplement, GLUCERNA  SHAKE, (GLUCERNA SHAKE) LIQD Take 237 mLs by mouth 2 (two) times daily between meals.    [provider]  hydrALAZINE (APRESOLINE) 25 MG tablet Take 25 mg by mouth 2 (two) times daily.    [provider]  LANTUS SOLOSTAR 100 UNIT/ML SOPN Inject 8 Units into the skin at bedtime.  10/06/12   [provider]  loperamide (IMODIUM) 2 MG capsule Take 2 mg by mouth every 6 (six) hours as needed for diarrhea or loose stools.     [provider]  metoprolol succinate (TOPROL-XL) 25 MG 24 hr tablet Take 25 mg by mouth daily. Along with 50 mg to = 75 mg    [provider]  metoprolol succinate (TOPROL-XL) 50 MG 24 hr tablet Take 50 mg by mouth daily. Along with 25 mg to = 75 mg    [provider]  potassium chloride SA (KLOR-CON) 10 MEQ tablet Take 1 tablet (10 mEq total) by mouth every other day. 07/08/19   Johnson, Clanford L, MD  sertraline (ZOLOFT) 50 MG tablet Take 50 mg by mouth daily.    [provider]  tamsulosin (FLOMAX) 0.4 MG CAPS capsule Take 1 capsule (0.4 mg total) by mouth daily. 07/08/19   Johnson, Clanford L, MD  torsemide (DEMADEX) 10 MG tablet Take 5 tablets (50 mg total) by mouth daily. 07/09/19   Johnson, Clanford L, MD  TRELEGY ELLIPTA 100-62.5-25 MCG/INH AEPB Inhale 1 puff into the lungs daily. 05/13/19   [provider]  vitamin C (ASCORBIC ACID) 500 MG tablet Take 500 mg by mouth daily. 07/15/19 07/28/19  [provider]  zinc sulfate 220 (50 Zn) MG capsule Take 220 mg by mouth daily. 07/15/19 07/28/19  [provider]     Critical care time: 31

## 2019-07-25 NOTE — Progress Notes (Signed)
Pharmacy Antibiotic Note  [name] is a 73 y.o. female admitted on 07/17/2019 to [facility] transferred to [facility] for CRRT 12/20.  UTI diagnosed at [facility] started treatment with cipro.  Pharmacy has been consulted for Cipro  Dosing while on CRRT. WBC wnl, afebrile  Plan: Change cipro 400mg  IV q12h x5 more days = total; 7days tx  Height: 4\' 8"  (142.2 cm) Weight: 193 lb 2 oz (87.6 kg) IBW/kg (Calculated) : 36.3  Temp (24hrs), Avg:98 F (36.7 C), Min:97.6 F (36.4 C), Max:98.5 F (36.9 C)  Recent Labs  Lab 07/20/19 0722 07/22/19 0350 07/23/19 0351 07/24/19 0424 07/24/19 0425 07/25/19 0257  WBC 8.1 7.0 6.7  --  7.8  --   CREATININE 3.37* 5.55* 6.41* 6.97*  --  7.98*    Estimated Creatinine Clearance: 5.5 mL/min (A) (by C-G formula based on SCr of 7.98 mg/dL (H)).    No Known Allergies  Antimicrobials this admission: Cipro 12/17> (12/25)  Dose adjustments this admission: cipro500mg  qd> cipro 400mg  IV q12h when CRRT started 12/20  Microbiology results:  UCx: enterobacter R ancef and nitrofuratoin   Bonnita Nasuti Pharm.D. CPP, BCPS Clinical Pharmacist [phone] 07/25/2019 3:32 PM

## 2019-07-25 NOTE — Progress Notes (Signed)
eLink Physician-Brief Progress Note Patient Name: [name] DOB: 1946-07-21 MRN: [id number]   Date of Service  07/25/2019  HPI/Events of Note  Mg++ = 1.4 and Creatinine = 7.9  eICU Interventions  Will replace Mg++ cautiously.      Intervention Category Major Interventions: Electrolyte abnormality - evaluation and management  Symphonie Schneiderman [place] 07/25/2019, 7:09 AM

## 2019-07-25 NOTE — H&P (Signed)
NAME:  [name], MRN:  [id number], DOB:  [birth year], LOS: 7 ADMISSION DATE:  07/17/2019, CONSULTATION DATE:  07/25/2019 REFERRING MD:  Forestine Na, CHIEF COMPLAINT:  AKI   Brief History    73 yo F with a history of diastolic heart failure, CKD stage IIIb, COPD, chronic respiratory failure, obesity, OSA, Afib (on chronic anticoagulation), GERD, hiatal hernia, gout, DM2, and chronic anemia who presented to [facility] on 12/12 after an episode of coffee ground emesis, nausea and vomiting.  She had an EGD done that showed esophagitis but no active bleeding ulcers.  She was also started on ciprofloaxinc for a UTI.  Unfortunately, throughout her hospital stay, he developed afib with RVR, started on amiodarone, and has had progressive renal failure, and has been anuric the last 24 hours.  She was transferred from [facility] to [facility] for consideration for CRRT.  At the bedside, patient is mentating appropriately and is in no distress.  Denies any significant shortness of breath.    Past Medical History   has a past medical history of Blood transfusion without reported diagnosis, CAD (coronary artery disease), Cataract, Cervical cancer ([place]) (1978), CHF (congestive heart failure) ([place]), Chronic back pain, Chronic kidney disease, COPD (chronic obstructive pulmonary disease) ([place]), Degenerative disc disease, Essential hypertension, benign, GERD (gastroesophageal reflux disease), Gout, Hiatal hernia (07/27/2013), History of diverticulitis of colon, History of hiatal hernia, Iron deficiency anemia, Irritable bowel syndrome, Lumbar radiculopathy, Mixed hyperlipidemia, Moderate major depression, single episode ([place]) (07/21/2019), Neuropathy, Osteoporosis, Ovarian cancer ([employer]) (1978), Oxygen deficiency, Sleep apnea, Type 2 diabetes mellitus ([place]), Vitamin B deficiency (12/25/2009), and Vitamin B12 deficiency.   has a past surgical history that includes Esophagogastroduodenoscopy (11/19/2006); Colonoscopy  (10/01/2006); Cholecystectomy; Partial hysterectomy (1978); Tonsillectomy and adenoidectomy; Umbilical hernia repair (2010); Benign breast cysts; Two back surgeries/fusion; Abdominal hysterectomy; Back surgery; Esophagogastroduodenoscopy (10/01/2006); Esophagogastroduodenoscopy (2011); Colonoscopy (2011); small bowel capsule (2008); Esophagogastroduodenoscopy (N/A, 08/06/2013); Givens capsule study (N/A, 08/06/2013); Knee surgery (Right); Colonoscopy (N/A, 01/26/2016); Spine surgery; and Esophagogastroduodenoscopy (egd) with propofol (N/A, 07/19/2019).   Significant Hospital Events   12/14 EGD: esophagitis, Schatzki ring dilation 12/15 Afib with RVR: started on amiodarone 12/16: AKI on CKD, renal consulted initially  Consults:  Cardiology Nephrology GI  Procedures:  EGD 2/14  Significant Diagnostic Tests:  12/12 CT A/P without contrast: stable large hiatal hernia with majority of the stomach in the Chest. Increased bibasilar atelectasis. Right middle lobe 5 mm pulmonary nodules remain indeterminate.  Diverticulosis without acute inflammatory process. Improved body anasarca  Micro Data:  COVID-19 12/16--> negative Urine culture 12/12--> enterobacter aerogenes.   Antimicrobials:  Ciprofloxacin started 12/17  Interim history/subjective:    07/25/2019- not on vent. Not on pressors. On amio gtt for a fib. HR well controlled. Has OSA and cPAP at home but not here. No active bleeding per RN. Has 150cc urine in bladder as of this morning 6am. Normotensive. CRRT/HD not started. No catheter. CREAT 8 and up from 7. Per renal -> needs HD cath and CVVHD  Objective   Blood pressure 100/61, pulse (!) 103, temperature 97.9 F (36.6 C), temperature source Oral, resp. rate (!) 21, height 4\' 8"  (1.422 m), weight 87.6 kg, SpO2 95 %.        Intake/Output Summary (Last 24 hours) at 07/25/2019 0914 Last data filed at 07/25/2019 0800 Gross per 24 hour  Intake 1136.63 ml  Output 0 ml  Net 1136.63 ml     Filed Weights   07/24/19 0410 07/24/19 2130 07/25/19 0500  Weight: 87.9  kg 87.6 kg 87.6 kg    Examination:  General Appearance:  Looks deconditioned. Obese.Lying in bed wtihout destress Head:  Normocephalic, without obvious abnormality, atraumatic Eyes:  PERRL - yes, conjunctiva/corneas - muddy     Ears:  Normal external ear canals, both ears Nose:  G tube - no has Bantry Throat:  ETT TUBE - no , OG tube - no. MALLAMPATTI CLASS 4 Neck:  Supple,  No enlargement/tenderness/nodules Lungs: Clear to auscultation bilaterally,  Heart:  S1 and S2 normal, no murmur, CVP - x.  Pressors - no Abdomen:  Soft, no masses, no organomegaly Genitalia / Rectal:  Not done Extremities:  Extremities- intact Skin:  ntact in exposed areas . Sacral area - intact Neurologic:  Sedation - none -> RASS - +1 . Moves all 4s - yes. CAM-ICU - x . Orientation - answered simple questions      Resolved Hospital Problem list     Assessment & Plan:  73 yo F who presented with UGIB due to esophagitis in the setting of GERD and meloxicam use, hospital course complicated with Afib with RVR, and now with worsening AKI on CKD, transferred from [name] to [facility] for consideration for CRRT.    # AKI on CKD:  Likely is ATN precipitated by episode of poor perfusion with Afib with RVR episode on 12/15.  Uremia is worsening, but patient is currently mentating appropriately.    07/25/2019 - Worsenign creat to 8  Plan  - place HD cath and start CVVHD (d/w renal)   # Afib:  Currently rate controlled and hemodynamically stable on amiodarone gtt  07/25/2019 - A Fib on amio gtt with rate control  plan - continue amio gtt  - will hold off on therapeutic AC for now since had recent GI bleed.  - start heparin gtt  After staring HD - likely 07/26/19    # Acute on chronic anemia # Iron deficiency anemia  07/25/2019 - no activel bleeding  Plan  - - PRBC for hgb </= 6.9gm%    - exceptions are   -  if ACS  susepcted/confirmed then transfuse for hgb </= 8.0gm%,  or    -  active bleeding with hemodynamic instability, then transfuse regardless of hemoglobin value   At at all times try to transfuse 1 unit prbc as possible with exception of active hemorrhage    # UGIB due to esophagitis   - 12/20  - no abd tenderness  PLAN - ppi and pepcid - iron supplementation on discharge - frequent CBCs - DVT ppx, can think about resuming therapeutic [facility] tomorrow 07/26/2019  # UTI, enterobacter aerogenes - cipro for 7 days (day 3) - currently renally dosed at 500mg  QD  # Chronic diastolic CHF: - holding torsemide, followup with nephrology - currently euvolemic  # COPD: no exacerbation, continue home inhalers and O2  # Gout: continue allopurinol, at reduced dose  #OSA - on home cPAP   - start cPAP QHS   #3Electrolyte imbalance   - 07/25/2019 - low mag   Plan  - repleted by elink  Best practice:  Diet: Nephro  Pain/Anxiety/Delirium protocol (if indicated): n/a VAP protocol (if indicated): n/a DVT prophylaxis: SQH GI prophylaxis: ppi and pepcid Glucose control: lantus 5 and SSI Mobility: PT ordered Code Status: Code status Family Communication: patient not too communicative for update. Tried to call son [place].   Disposition: ICU      ATTESTATION & SIGNATURE  The patient [name] is critically ill with multiple organ systems failure and requires high complexity decision making for assessment and support, frequent evaluation and titration of therapies, application of advanced monitoring technologies and extensive interpretation of multiple databases.   Critical Care Time devoted to patient care services described in this note is  30  Minutes. This time reflects time of care of this signee Dr Brand Males. This critical care time does not reflect procedure time, or teaching time or supervisory time of PA/NP/Med student/Med Resident etc but could  involve care discussion time     Dr. Brand Males, M.D., [facility]C.P Pulmonary and Critical Care Medicine Staff Physician [place] [facility] Pager: [phone], If no answer or between  15:00h - 7:00h: call [phone]  07/25/2019 9:14 AM     LABS    PULMONARY No results for input(s): PHART, PCO2ART, PO2ART, HCO3, TCO2, O2SAT in the last 168 hours.  Invalid input(s): PCO2, PO2  CBC Recent Labs  Lab 07/22/19 0350 07/23/19 0351 07/23/19 1843 07/24/19 0425  HGB 7.3* 6.9* 9.4* 8.7*  HCT 24.0* 22.6* 30.4* 28.5*  WBC 7.0 6.7  --  7.8  PLT 286 244  --  226    COAGULATION No results for input(s): INR in the last 168 hours.  CARDIAC  No results for input(s): TROPONINI in the last 168 hours. No results for input(s): PROBNP in the last 168 hours.   CHEMISTRY Recent Labs  Lab 07/20/19 0722 07/22/19 0350 07/23/19 0351 07/24/19 0424 07/25/19 0257  NA 143 139 136 135 134*  K 4.1 4.1 4.5 4.5 4.9  CL 96* 93* 92* 95* 93*  CO2 33* 33* 30 30 25   GLUCOSE 148* 113* 112* 111* 123*  BUN 87* 98* 105* 102* 103*  CREATININE 3.37* 5.55* 6.41* 6.97* 7.98*  CALCIUM 8.6* 8.3* 7.8* 7.5* 7.5*  MG  --  1.6*  --   --  1.4*  PHOS  --   --  5.6* 6.3* 7.3*   Estimated Creatinine Clearance: 5.5 mL/min (A) (by C-G formula based on SCr of 7.98 mg/dL (H)).   LIVER Recent Labs  Lab 07/20/19 0722 07/23/19 0351 07/24/19 0424  AST 14*  --   --   ALT 9  --   --   ALKPHOS 64  --   --   BILITOT 1.3*  --   --   PROT 7.0  --   --   ALBUMIN 2.3* 1.9* 2.0*     INFECTIOUS No results for input(s): LATICACIDVEN, PROCALCITON in the last 168 hours.   ENDOCRINE CBG (last 3)  Recent Labs    07/24/19 2059 07/24/19 2305 07/25/19 0646  GLUCAP 112* 103* 93         IMAGING x48h  - image(s) personally visualized  -   highlighted in bold No results found.

## 2019-07-25 NOTE — Progress Notes (Signed)
Progress Note  Patient Name: [name] Date of Encounter: 07/25/2019  Primary Cardiologist: [name], MD   Subjective   She is upset this AM. She is frustrated by her clinical condition, the fact that her sons are fighting over who can visit her, etc. She feels "sick at heart." We reviewed her GI, kidney, and heart issues today. She is asking about how long it until it will all be better. I focused on the cardiac aspect and answered questions to the best of my ability. She is tired/worn out, but otherwise no specific symptoms today.  Inpatient Medications    Scheduled Meds: . sodium chloride   Intravenous Once  . allopurinol  100 mg Oral Daily  . atorvastatin  40 mg Oral QHS  . Chlorhexidine Gluconate Cloth  6 each Topical Daily  . ciprofloxacin  500 mg Oral Daily  . darbepoetin (ARANESP) injection - NON-DIALYSIS  150 mcg Subcutaneous Q Thu-1800  . famotidine  20 mg Oral QHS  . umeclidinium bromide  1 puff Inhalation Daily   And  . fluticasone furoate-vilanterol  1 puff Inhalation Daily  . heparin  5,000 Units Subcutaneous Q8H  . insulin aspart  0-9 Units Subcutaneous TID WC  . insulin glargine  5 Units Subcutaneous QHS  . metoprolol tartrate  25 mg Oral BID  . pantoprazole  40 mg Oral BID AC  . sertraline  50 mg Oral Daily  . sodium chloride flush  10-40 mL Intracatheter Q12H  . sodium chloride flush  3 mL Intravenous Q12H   Continuous Infusions: . sodium chloride    . amiodarone 30 mg/hr (07/25/19 1049)   PRN Meds: sodium chloride, acetaminophen **OR** acetaminophen, albuterol, sodium chloride flush, sodium chloride flush   Vital Signs    Vitals:   07/25/19 0800 07/25/19 0805 07/25/19 0900 07/25/19 1000  BP: 100/61  (!) 110/52 (!) 98/55  Pulse: (!) 103  (!) 101 (!) 112  Resp: (!) 21  (!) 24 (!) 31  Temp: 98.5 F (36.9 C) 97.9 F (36.6 C)  97.8 F (36.6 C)  TempSrc: Oral Oral  Oral  SpO2: 95%  92% 94%  Weight:      Height:         Intake/Output Summary (Last 24 hours) at 07/25/2019 1105 Last data filed at 07/25/2019 1000 Gross per 24 hour  Intake 1213.41 ml  Output 0 ml  Net 1213.41 ml   Last 3 Weights 07/25/2019 07/24/2019 07/24/2019  Weight (lbs) 193 lb 2 oz 193 lb 2 oz 193 lb 12.6 oz  Weight (kg) 87.6 kg 87.6 kg 87.9 kg      Telemetry    Atrial fibrillation, average heart rate around 100 bpm - Personally Reviewed  ECG    No new since 07/20/19 - Personally Reviewed  Physical Exam   GEN: Resting in bed, [place] in place, NAD Neck: No JVD Cardiac: tachycardic, irregularly irregular S1/S2, no murmurs, rubs, or gallops appreciated.  Respiratory: mild scattered rhonchi but largely clear GI: Soft, nontender, non-distended  MS: No edema; No deformity. Neuro:  Nonfocal  Psych: Normal affect   Labs    High Sensitivity Troponin:   Recent Labs  Lab 07/05/19 1753 07/05/19 1955 07/05/19 2157 07/17/19 0940 07/17/19 1042  TROPONINIHS 52* 50* 51* 19* 20*      Chemistry Recent Labs  Lab 07/20/19 0722 07/23/19 0351 07/24/19 0424 07/25/19 0257  NA 143 136 135 134*  K 4.1 4.5 4.5 4.9  CL 96* 92* 95* 93*  CO2  33* 30 30 25   GLUCOSE 148* 112* 111* 123*  BUN 87* 105* 102* 103*  CREATININE 3.37* 6.41* 6.97* 7.98*  CALCIUM 8.6* 7.8* 7.5* 7.5*  PROT 7.0  --   --   --   ALBUMIN 2.3* 1.9* 2.0*  --   AST 14*  --   --   --   ALT 9  --   --   --   ALKPHOS 64  --   --   --   BILITOT 1.3*  --   --   --   GFRNONAA 13* 6* 5* 5*  GFRAA 15* 7* 6* 5*  ANIONGAP 14 14 10  16*     Hematology Recent Labs  Lab 07/22/19 0350 07/23/19 0351 07/23/19 1843 07/24/19 0425  WBC 7.0 6.7  --  7.8  RBC 2.62* 2.47*  --  3.21*  HGB 7.3* 6.9* 9.4* 8.7*  HCT 24.0* 22.6* 30.4* 28.5*  MCV 91.6 91.5  --  88.8  MCH 27.9 27.9  --  27.1  MCHC 30.4 30.5  --  30.5  RDW 16.9* 16.6*  --  17.0*  PLT 286 244  --  226    BNPNo results for input(s): BNP, PROBNP in the last 168 hours.   DDimer No results for input(s): DDIMER in  the last 168 hours.   Radiology    No results found.  Cardiac Studies   Echo 06/16/19  1. Left ventricular ejection fraction, by visual estimation, is 55 to 60%. The left ventricle has normal function. There is no left ventricular hypertrophy.  2. Elevated left atrial pressure.  3. Left ventricular diastolic parameters are consistent with Grade II diastolic dysfunction (pseudonormalization).  4. Global right ventricle has normal systolic function.The right ventricular size is normal. No increase in right ventricular wall thickness.  5. Left atrial size was moderately dilated.  6. Right atrial size was normal.  7. The mitral valve was not well visualized. Trace mitral valve regurgitation. No evidence of mitral stenosis.  8. The tricuspid valve is normal in structure. Tricuspid valve regurgitation is mild.  9. The aortic valve was not well visualized. Aortic valve regurgitation is not visualized. No evidence of aortic valve sclerosis or stenosis. 10. The pulmonic valve was not well visualized. Pulmonic valve regurgitation not assessed. 11. The aortic root was not well visualized. 12. Moderately elevated pulmonary artery systolic pressure. 13. Moderate pulmonary HTN, PASP is 48 mmHg. 14. The inferior vena cava is normal in size with greater than 50% respiratory variability, suggesting right atrial pressure of 3 mmHg. 15. The interatrial septum was not well visualized.  Patient Profile     73 y.o. female with past medical history of chronic diastolic CHF, CAD (nonobstructive CAD by cath in 2000), HTN, HLD, Stage 4 CKD , COPD, and OSA (on CPAP) presenting to the hospital after recent coffee-ground emesis with EGD showing mild erosive esophagitis and Schatzki ring requiring dilatation.  She subsequently developed atrial fibrillation with RVR requiring further management.  Assessment & Plan    Atrial fibrillation with RVR, complicated by acute GI bleed requiring transfusion: Followed by Dr.  Domenic Polite while at [facility], transferred for higher level of care.   Difficult situation. Anticoagulation was held due to GI bleed with anemia. Has required transfusion for this. Also had mild erosive esophagitis and Schatzki ring that required dilation. This is at least a relative contraindication for TEE.   Combination of inability to anticoagulate and contraindication to TEE makes cardioversion exceptionally high risk at this  time. Ideally, would plan to have her on anticoagulation for three weeks prior to cardioversion, but given her current medical condition, this may not be possibly. Would aim for rate control in the immediate short term. -she has been on metoprolol 25 mg BID while inpatient, in addition to amiodarone 30 mg/hr. Was previously on diltiazem drip, but this was discontinued due to hypotension. She was on diltiazem 120 mg and metoprolol sucicnate 75 mg daily.  -she was also on apixaban 5 mg BID at home, which was discontinued due to GI bleed. GI note yesterday states that retrial of apixaban could begin today. With plans for dialysis, would monitor today. If she does well, consider trial with heparin drip, and then if stable can restart apixaban. I do not see a CBC yet from today to see if her counts are stable. Post transfusion, she went from 6.9 to 9.4, yesterday was 8.7. Would want to make sure counts are not dropping further prior to starting anticoagulation. Denies bloody emesis or abnormal BM. -CHA2DS2/VAS Stroke Risk Points= 6 -her heart rate has remained around 100 bpm here. Would prefer <90 bpm. There is a risk of chemical cardioversion with amiodarone. She presented in NSR during her admission, has been in afib since 12/15 around 6 AM. Has not been on digoxin, I suspect due to her renal issues. For now, I would continue the amiodarone and metoprolol. Ideally would prefer to uptitrate her metoprolol as BP allows for rate control.  Chronic diastolic heart failure, acute on  chronic kidney injury: -plans for HD today as she has been oliguric. Per nephrology  Not active: Nonobstructive CAD by history.  - HS trop peak 52, trended down, consistent with demand ischemia  CRITICAL CARE Patient is critically ill with multiple organ systems affected and requires high complexity decision making. Total critical care time: 35 minutes. This time includes gathering of history, evaluation of patient's response to treatment, examination of patient, review of laboratory and imaging studies, and coordination with consultants.   For questions or updates, please contact [place] Please consult [email or web address] for contact info under     Signed, Buford Dresser, MD  07/25/2019, 11:05 AM

## 2019-07-26 DIAGNOSIS — I4891 Unspecified atrial fibrillation: Secondary | ICD-10-CM

## 2019-07-26 LAB — RENAL FUNCTION PANEL
Albumin: 1.7 g/dL — ABNORMAL LOW (ref 3.5–5.0)
Albumin: 1.7 g/dL — ABNORMAL LOW (ref 3.5–5.0)
Anion gap: 13 (ref 5–15)
Anion gap: 6 (ref 5–15)
BUN: 64 mg/dL — ABNORMAL HIGH (ref 8–23)
BUN: 37 mg/dL — ABNORMAL HIGH (ref 8–23)
CO2: 25 mmol/L (ref 22–32)
CO2: 28 mmol/L (ref 22–32)
Calcium: 7.5 mg/dL — ABNORMAL LOW (ref 8.9–10.3)
Calcium: 7.4 mg/dL — ABNORMAL LOW (ref 8.9–10.3)
Chloride: 95 mmol/L — ABNORMAL LOW (ref 98–111)
Chloride: 100 mmol/L (ref 98–111)
Creatinine, Ser: 5.54 mg/dL — ABNORMAL HIGH (ref 0.44–1.00)
Creatinine, Ser: 3.71 mg/dL — ABNORMAL HIGH (ref 0.44–1.00)
GFR calc Af Amer: 8 mL/min — ABNORMAL LOW (ref >60)
GFR calc Af Amer: 13 mL/min — ABNORMAL LOW (ref >60)
GFR calc non Af Amer: 7 mL/min — ABNORMAL LOW (ref >60)
GFR calc non Af Amer: 11 mL/min — ABNORMAL LOW (ref >60)
Glucose, Bld: 126 mg/dL — ABNORMAL HIGH (ref 70–99)
Glucose, Bld: 171 mg/dL — ABNORMAL HIGH (ref 70–99)
Phosphorus: 4.8 mg/dL — ABNORMAL HIGH (ref 2.5–4.6)
Phosphorus: 3.1 mg/dL (ref 2.5–4.6)
Potassium: 4.6 mmol/L (ref 3.5–5.1)
Potassium: 4.5 mmol/L (ref 3.5–5.1)
Sodium: 133 mmol/L — ABNORMAL LOW (ref 135–145)
Sodium: 134 mmol/L — ABNORMAL LOW (ref 135–145)

## 2019-07-26 LAB — GLUCOSE, CAPILLARY
Glucose-Capillary: 98 mg/dL (ref 70–99)
Glucose-Capillary: 107 mg/dL — ABNORMAL HIGH (ref 70–99)
Glucose-Capillary: 89 mg/dL (ref 70–99)
Glucose-Capillary: 125 mg/dL — ABNORMAL HIGH (ref 70–99)

## 2019-07-26 LAB — CBC
HCT: 29.7 % — ABNORMAL LOW (ref 36.0–46.0)
Hemoglobin: 9.6 g/dL — ABNORMAL LOW (ref 12.0–15.0)
MCH: 28.2 pg (ref 26.0–34.0)
MCHC: 32.3 g/dL (ref 30.0–36.0)
MCV: 87.4 fL (ref 80.0–100.0)
Platelets: 217 10*3/uL (ref 150–400)
RBC: 3.4 MIL/uL — ABNORMAL LOW (ref 3.87–5.11)
RDW: 17 % — ABNORMAL HIGH (ref 11.5–15.5)
WBC: 9.2 10*3/uL (ref 4.0–10.5)
nRBC: 0.2 % (ref 0.0–0.2)

## 2019-07-26 LAB — MAGNESIUM: Magnesium: 1.9 mg/dL (ref 1.7–2.4)

## 2019-07-26 MED ORDER — AMIODARONE HCL IN DEXTROSE 360-4.14 MG/200ML-% IV SOLN
60.0000 mg/h | INTRAVENOUS | Status: AC
  Administered 2019-07-26: 60 mg/h via INTRAVENOUS

## 2019-07-26 MED ORDER — AMIODARONE HCL IN DEXTROSE 360-4.14 MG/200ML-% IV SOLN
30.0000 mg/h | INTRAVENOUS | Status: DC
  Administered 2019-07-26 – 2019-07-28 (×5): 30 mg/h via INTRAVENOUS
  Filled 2019-07-26 (×4): qty 200

## 2019-07-26 MED ORDER — AMIODARONE HCL 200 MG PO TABS
200.0000 mg | ORAL_TABLET | Freq: Two times a day (BID) | ORAL | Status: DC
  Administered 2019-07-26: 200 mg via ORAL

## 2019-07-26 MED ORDER — AMIODARONE HCL 200 MG PO TABS: 200.0000 mg | ORAL_TABLET | Freq: Every day | ORAL | Status: DC

## 2019-07-26 NOTE — Progress Notes (Signed)
[facility] ROUNDING NOTE   Subjective:   This is a 73 year old lady with a history of diastolic heart failure, COPD, chronic kidney disease stage III/IV baseline serum creatinine appears to be at 3 mg/dL., obesity, obstructive sleep apnea, atrial fibrillation on chronic anticoagulation treatment, gastroesophageal reflux disease, hiatal hernia, gout, diabetes mellitus type 2.  She presented to [facility] 07/17/2019 with coffee-ground emesis EGD showed esophagitis but no active bleeding ulcers.  She also was found to have Enterobacter urinary tract infection and started on ciprofloxacin.  During her hospital stay she developed atrial fibrillation rapid ventricular rate and required amiodarone.    She was transferred to [facility] 07/25/2019 with acute on chronic kidney disease thought to be secondary to hypotension due to the atrial fibrillation and for the initiation of CRRT.. .  She will underwent insertion of right femoral triple-lumen catheter by critical care medicine 07/25/2019.  Appreciate assistance.  Urinalysis showed 100 mg/dL protein 6-10 RBCs and greater than 50 WBCs.  Culture positive for Enterobacter.  Oliguria with 190 cc of urine 07/25/2019  Blood pressure 91/32 pulse 99 temperature 97.5 O2 sats 100% 2 L nasal cannula  IV amiodarone IV ciprofloxacin  Sodium 133 potassium 4.6 chloride 95 CO2 25 BUN 64 creatinine 5.54 glucose 126 calcium 7.5 phosphorus 4.8 magnesium 1.9  WBC 9.2 hemoglobin 9.6 platelets 217  Results of abdominal CT 07/17/2019  stable large hiatal hernia with majority of the stomach in the chest.Increased bibasilar atelectasisRight middle lobe 5 mm pulmonary nodules remain indeterminate.    Objective:  Vital signs in last 24 hours:  Temp:  [96.7 F (35.9 C)-97.8 F (36.6 C)] 97.5 F (36.4 C) (12/21 0759) Pulse Rate:  [80-114] 103 (12/21 0900) Resp:  [12-31] 26 (12/21 0900) BP: (80-141)/(33-97) 84/33 (12/21 0900) SpO2:   [93 %-100 %] 99 % (12/21 0900) Weight:  [87 kg] 87 kg (12/21 0600)  Weight change: -0.6 kg Filed Weights   07/24/19 2130 07/25/19 0500 07/26/19 0600  Weight: 87.6 kg 87.6 kg 87 kg    Intake/Output: I/O last 3 completed shifts: In: 1708.2 [P.O.:540; I.V.:868.2; IV Piggyback:300] Out: 951 [Urine:190; Other:761]   Intake/Output this shift:  Total I/O In: 163.4 [P.O.:120; I.V.:43.4] Out: 150 [Urine:10; Other:140]  Gen: chronically ill appearing, obese WF CVS: tachycardic Resp: cta Abd: +BS,soft, NT/ND Ext: no edema   Basic Metabolic Panel: Recent Labs  Lab 07/22/19 0350 07/23/19 0351 07/24/19 0424 07/25/19 0257 07/25/19 1851 07/26/19 0352  NA 139 136 135 134* 133* 133*  K 4.1 4.5 4.5 4.9 5.4* 4.6  CL 93* 92* 95* 93* 94* 95*  CO2 33* 30 30 25 23 25   GLUCOSE 113* 112* 111* 123* 116* 126*  BUN 98* 105* 102* 103* 93* 64*  CREATININE 5.55* 6.41* 6.97* 7.98* 7.08* 5.54*  CALCIUM 8.3* 7.8* 7.5* 7.5* 7.6* 7.5*  MG 1.6*  --   --  1.4*  --  1.9  PHOS  --  5.6* 6.3* 7.3* 6.9* 4.8*    Liver Function Tests: Recent Labs  Lab 07/20/19 0722 07/23/19 0351 07/24/19 0424 07/25/19 1851 07/26/19 0352  AST 14*  --   --   --   --   ALT 9  --   --   --   --   ALKPHOS 64  --   --   --   --   BILITOT 1.3*  --   --   --   --   PROT 7.0  --   --   --   --  ALBUMIN 2.3* 1.9* 2.0* 2.0* 1.7*   No results for input(s): LIPASE, AMYLASE in the last 168 hours. No results for input(s): AMMONIA in the last 168 hours.  CBC: Recent Labs  Lab 07/20/19 0722 07/22/19 0350 07/22/19 1143 07/23/19 0351 07/23/19 1843 07/24/19 0425 07/26/19 0352  WBC 8.1 7.0  --  6.7  --  7.8 9.2  HGB 8.6* 7.3* 7.6* 6.9* 9.4* 8.7* 9.6*  HCT 28.6* 24.0* 25.0* 22.6* 30.4* 28.5* 29.7*  MCV 92.0 91.6  --  91.5  --  88.8 87.4  PLT 318 286  --  244  --  226 217    Cardiac Enzymes: No results for input(s): CKTOTAL, CKMB, CKMBINDEX, TROPONINI in the last 168 hours.  BNP: Invalid input(s):  POCBNP  CBG: Recent Labs  Lab 07/25/19 0646 07/25/19 1354 07/25/19 1732 07/25/19 2231 07/26/19 0632  GLUCAP 93 92 104* 137* 98    Microbiology: Results for orders placed or performed during the hospital encounter of 07/17/19  Urine culture     Status: Abnormal   Collection Time: 07/17/19  9:40 AM   Specimen: Urine, Clean Catch  Result Value Ref Range Status   Specimen Description   Final    URINE, CLEAN CATCH Performed at [facility], [address], [place], [place] [address]    Special Requests   Final    NONE Performed at [facility], [address] [place], [place] [address]    Culture >=100,000 COLONIES/mL ENTEROBACTER AEROGENES (A)  Final   Report Status 07/20/2019 FINAL  Final   Organism ID, Bacteria ENTEROBACTER AEROGENES (A)  Final      Susceptibility   Enterobacter aerogenes - MIC*    CEFAZOLIN >=64 RESISTANT Resistant     CEFTRIAXONE <=1 SENSITIVE Sensitive     CIPROFLOXACIN <=0.25 SENSITIVE Sensitive     GENTAMICIN <=1 SENSITIVE Sensitive     IMIPENEM 1 SENSITIVE Sensitive     NITROFURANTOIN 64 INTERMEDIATE Intermediate     TRIMETH/SULFA <=20 SENSITIVE Sensitive     PIP/TAZO <=4 SENSITIVE Sensitive     * >=100,000 COLONIES/mL ENTEROBACTER AEROGENES  SARS CORONAVIRUS 2 (TAT 6-24 HRS) Nasopharyngeal Nasopharyngeal Swab     Status: None   Collection Time: 07/17/19  2:31 PM   Specimen: Nasopharyngeal Swab  Result Value Ref Range Status   SARS Coronavirus 2 NEGATIVE NEGATIVE Final    Comment: (NOTE) SARS-CoV-2 target nucleic acids are NOT DETECTED. The SARS-CoV-2 RNA is generally detectable in upper and lower respiratory specimens during the acute phase of infection. Negative results do not preclude SARS-CoV-2 infection, do not rule out co-infections with other pathogens, and should not be used as the sole basis for treatment or other patient management decisions. Negative results must be combined with clinical observations, patient history, and  epidemiological information. The expected result is Negative. Fact Sheet for Patients: SugarRoll.be Fact Sheet for Healthcare Providers: https://www.woods-mathews.com/ This test is not yet approved or cleared by the [place] FDA and  has been authorized for detection and/or diagnosis of SARS-CoV-2 by FDA under an Emergency Use Authorization (EUA). This EUA will remain  in effect (meaning this test can be used) for the duration of the COVID-19 declaration under Section 56 4(b)(1) of the Act, 21 U.S.C. section 360bbb-3(b)(1), unless the authorization is terminated or revoked sooner. Performed at [facility], [place] [address], [place], [place] [address]   MRSA PCR Screening     Status: None   Collection Time: 07/17/19 10:00 PM   Specimen: Nasal Mucosa; Nasopharyngeal  Result Value Ref Range Status   MRSA by PCR NEGATIVE NEGATIVE Final    Comment:        The GeneXpert MRSA Assay (FDA approved for NASAL specimens only), is one component of a comprehensive MRSA colonization surveillance program. It is not intended to diagnose MRSA infection nor to guide or monitor treatment for MRSA infections. Performed at [facility], [address] [place], [place] [address]   Novel Coronavirus, NAA (hospital order; send-out to ref lab)     Status: None   Collection Time: 07/21/19 11:00 AM   Specimen: Nasopharyngeal Swab; Respiratory  Result Value Ref Range Status   SARS-CoV-2, NAA NOT DETECTED NOT DETECTED Final    Comment: (NOTE) This nucleic acid amplification test was developed and its performance characteristics determined by [employer]. Nucleic acid amplification tests include PCR and TMA. This test has not been FDA cleared or approved. This test has been authorized by FDA under an Emergency Use Authorization (EUA). This test is only authorized for the duration of time the declaration that circumstances exist justifying the  authorization of the emergency use of in vitro diagnostic tests for detection of SARS-CoV-2 virus and/or diagnosis of COVID-19 infection under section 564(b)(1) of the Act, 21 U.S.C. 213YQM-5(H) (1), unless the authorization is terminated or revoked sooner. When diagnostic testing is negative, the possibility of a false negative result should be considered in the context of a patient's recent exposures and the presence of clinical signs and symptoms consistent with COVID-19. An individual without symptoms of COVID- 19 and who is not shedding SARS-CoV-2 vi rus would expect to have a negative (not detected) result in this assay. Performed At: [facility] [address] [place], [place] [id number] Katina Degree MDPhD WU:[id number]    Coronavirus Source NASOPHARYNGEAL  Final    Comment: Performed at [facility], [address] [place], [place] [address]    Coagulation Studies: No results for input(s): LABPROT, INR in the last 72 hours.  Urinalysis: No results for input(s): COLORURINE, LABSPEC, PHURINE, GLUCOSEU, HGBUR, BILIRUBINUR, KETONESUR, PROTEINUR, UROBILINOGEN, NITRITE, LEUKOCYTESUR in the last 72 hours.  Invalid input(s): APPERANCEUR    Imaging: DG CHEST PORT 1 VIEW  Result Date: 07/25/2019 CLINICAL DATA:  Central line placement. EXAM: PORTABLE CHEST 1 VIEW COMPARISON:  Chest x-ray dated July 17, 2019. FINDINGS: The patient is rotated to the right. Unchanged right chest wall port catheter with tip in the SVC. Grossly stable cardiomediastinal silhouette accounting for rotation. No focal consolidation, pleural effusion, or pneumothorax. No acute osseous abnormality. IMPRESSION: 1. Unchanged right chest wall port catheter. No new central line identified. 2. No active disease. Electronically Signed   By: Titus Dubin M.D.   On: 07/25/2019 13:08     Medications:   .  prismasol BGK 4/2.5 500 mL (07/26/19 0020)  .  prismasol BGK 4/2.5 300 mL/hr at 07/26/19 0835  . sodium  chloride 5 mL/hr at 07/26/19 0900  . amiodarone 30 mg/hr (07/26/19 0900)  . ciprofloxacin 400 mg (07/26/19 0920)  . prismasol BGK 4/2.5 1,000 mL/hr (07/26/19 4403)   . sodium chloride   Intravenous Once  . allopurinol  100 mg Oral Daily  . atorvastatin  40 mg Oral QHS  . Chlorhexidine Gluconate Cloth  6 each Topical Daily  . darbepoetin (ARANESP) injection - NON-DIALYSIS  150 mcg Subcutaneous Q Thu-1800  . famotidine  20 mg Oral QHS  . umeclidinium bromide  1 puff Inhalation Daily   And  . fluticasone furoate-vilanterol  1 puff  Inhalation Daily  . heparin  5,000 Units Subcutaneous Q8H  . insulin aspart  0-9 Units Subcutaneous TID WC  . insulin glargine  5 Units Subcutaneous QHS  . metoprolol tartrate  25 mg Oral BID  . pantoprazole  40 mg Oral BID AC  . sertraline  50 mg Oral Daily  . sodium chloride flush  10-40 mL Intracatheter Q12H  . sodium chloride flush  3 mL Intravenous Q12H   sodium chloride, acetaminophen **OR** acetaminophen, albuterol, heparin, heparin, sodium chloride flush, sodium chloride flush  Assessment/ Plan:  1. AKI/CKD stage 4- in setting of atrial fibrillation, hypotension, h/o NSAIDs, and ABLA due to UGIB. She remains oliguric. Also with h/o bladder outlet obstruction. CRRT initiated 07/25/2019.  No heparin secondary to hematemesis.  Keeping even with no volume removal. 2. Hematemesis/UGIB- seen by GI and EGD with erosive reflux esophagitis. Transfused 2 units PRBC's on 07/23/19.  No heparin with CVVHD.  Continues on Protonix 40 mg twice daily and famotidine 20 mg nightly.  I wonder if she needs both. 3. Atrial fibrillation with RVR and ongoing hypotension- on amiodarone drip. Continues on metoprolol 25 mg twice daily.  No anticoagulation secondary to GI bleed.  Was on apixaban as an outpatient 4. UTI -Enterobacter  on cipro day 4/7 5. Diastolic congestive heart failure torsemide being held.  Home dose 50 mg daily 6. Depression Zoloft 50 mg daily 7. Gout  allopurinol 100 mg daily 8. Diabetes mellitus per primary team 9. Hyperlipidemia continue Lipitor.    LOS: [place] @TODAY @9 :25 AM

## 2019-07-26 NOTE — Progress Notes (Addendum)
NAME:  [name], MRN:  [id number], DOB:  09-06-1945, LOS: 74 ADMISSION DATE:  07/17/2019, CONSULTATION DATE:  07/25/2019 REFERRING MD:  Forestine Na, CHIEF COMPLAINT:  AKI   Brief History    73 yo F with a history of diastolic heart failure, CKD stage IIIb, COPD, chronic respiratory failure, obesity, OSA, Afib (on chronic anticoagulation), GERD, hiatal hernia, gout, DM2, and chronic anemia who presented to [facility] on 12/12 after an episode of coffee ground emesis, nausea and vomiting.  She had an EGD done that showed esophagitis but no active bleeding ulcers.  She was also started on ciprofloaxinc for a UTI.  Unfortunately, throughout her hospital stay, he developed afib with RVR, started on amiodarone, and has had progressive renal failure, and has been anuric the last 24 hours.  She was transferred from [facility] to [facility] for consideration for CRRT.  At the bedside, patient is mentating appropriately and is in no distress.  Denies any significant shortness of breath.    Past Medical History   has a past medical history of Blood transfusion without reported diagnosis, CAD (coronary artery disease), Cataract, Cervical cancer ([place]) (1978), CHF (congestive heart failure) ([place]), Chronic back pain, Chronic kidney disease, COPD (chronic obstructive pulmonary disease) ([place]), Degenerative disc disease, Essential hypertension, benign, GERD (gastroesophageal reflux disease), Gout, Hiatal hernia (07/27/2013), History of diverticulitis of colon, History of hiatal hernia, Iron deficiency anemia, Irritable bowel syndrome, Lumbar radiculopathy, Mixed hyperlipidemia, Moderate major depression, single episode ([place]) (07/21/2019), Neuropathy, Osteoporosis, Ovarian cancer ([place]) (1978), Oxygen deficiency, Sleep apnea, Type 2 diabetes mellitus ([place]), Vitamin B deficiency (12/25/2009), and Vitamin B12 deficiency.   has a past surgical history that includes Esophagogastroduodenoscopy (11/19/2006); Colonoscopy  (10/01/2006); Cholecystectomy; Partial hysterectomy (1978); Tonsillectomy and adenoidectomy; Umbilical hernia repair (2010); Benign breast cysts; Two back surgeries/fusion; Abdominal hysterectomy; Back surgery; Esophagogastroduodenoscopy (10/01/2006); Esophagogastroduodenoscopy (2011); Colonoscopy (2011); small bowel capsule (2008); Esophagogastroduodenoscopy (N/A, 08/06/2013); Givens capsule study (N/A, 08/06/2013); Knee surgery (Right); Colonoscopy (N/A, 01/26/2016); Spine surgery; and Esophagogastroduodenoscopy (egd) with propofol (N/A, 07/19/2019).   Significant Hospital Events   12/14 EGD: esophagitis, Schatzki ring dilation 12/15 Afib with RVR: started on amiodarone 12/16: AKI on CKD, renal consulted initially  Consults:  Cardiology Nephrology GI  Procedures:  EGD 2/14  Significant Diagnostic Tests:  12/12 CT A/P without contrast: stable large hiatal hernia with majority of the stomach in the Chest. Increased bibasilar atelectasis. Right middle lobe 5 mm pulmonary nodules remain indeterminate.  Diverticulosis without acute inflammatory process. Improved body anasarca  Micro Data:  COVID-19 12/16--> negative Urine culture 12/12--> enterobacter aerogenes.   Antimicrobials:  Ciprofloxacin 12/17->  Interim history/subjective:    07/26/2019- remains on amio gtt. Not on pressors but on crrt and bp marginal today.   12/20: not on vent. Not on pressors. On amio gtt for a fib. HR well controlled. Has OSA and cPAP at home but not here. No active bleeding per RN. Has 150cc urine in bladder as of this morning 6am. Normotensive. CRRT/HD not started. No catheter. CREAT 8 and up from 7. Per renal -> needs HD cath and CVVHD  Objective   Blood pressure (!) 95/56, pulse (!) 103, temperature 97.6 F (36.4 C), temperature source Oral, resp. rate (!) 22, height 4\' 8"  (1.422 m), weight 87 kg, SpO2 100 %.        Intake/Output Summary (Last 24 hours) at 07/26/2019 1010 Last data filed at  07/26/2019 1000 Gross per 24 hour  Intake 1417.91 ml  Output 1124 ml  Net 293.91  ml   Filed Weights   07/24/19 2130 07/25/19 0500 07/26/19 0600  Weight: 87.6 kg 87.6 kg 87 kg    Examination:  General Appearance:  Chronically ill appearing, resting comfortably on crrt Head:  Normocephalic, without obvious abnormality, atraumatic Eyes:  PERRL - yes, conjunctiva/corneas -clear Throat:  mmmp Neck:  Supple,  No enlargement/tenderness/nodules Lungs: Clear to auscultation bilaterally,  Heart:  S1 and S2 normal, no murmur,  Abdomen:  Soft, no masses, no organomegaly Genitalia / Rectal:  Not done Extremities:  No c/c + edema ble Skin:  intact in exposed areas . Neurologic:  arousable and follows commands. conversant     Resolved Hospital Problem list     Assessment & Plan:  73 yo F who presented with UGIB due to esophagitis in the setting of GERD and meloxicam use, hospital course complicated with Afib with RVR, and now with worsening AKI on CKD, transferred from [facility] to [facility] for consideration for CRRT.    # AKI on CKD4: 2/2 afib, hypotension, NSAIDs and ABLA due to UGIB oliguiric  Plan  - on CRRT  #chronic hypoxic resp failure:  On 2L at this time.   # Afib:  Currently rate controlled and hemodynamically stable on amiodarone gtt plan - change to PO - will hold off on therapeutic AC for now since had recent GI bleed.    # Acute on chronic anemia # Iron deficiency anemia -no further bleeding noted -cont heparin subcut without plans of heparin gtt at this time.    # UGIB due to esophagitis PLAN - ppi BID - iron supplementation on discharge - frequent CBCs - DVT ppx  # UTI, enterobacter aerogenes - cipro for 7 days  # Chronic diastolic CHF:  - holding torsemide, followup with nephrology - currently euvolemic  # COPD: no exacerbation, continue home inhalers and O2  # Gout: continue allopurinol, at reduced dose  #OSA - on home cPAP    Best  practice:  Diet: Nephro  Pain/Anxiety/Delirium protocol (if indicated): n/a VAP protocol (if indicated): n/a DVT prophylaxis: SQH GI prophylaxis: ppi and pepcid Glucose control: lantus 5 and SSI Mobility: PT ordered Code Status: full Family Communication:updated pt  Disposition: ICU     LABS    PULMONARY No results for input(s): PHART, PCO2ART, PO2ART, HCO3, TCO2, O2SAT in the last 168 hours.  Invalid input(s): PCO2, PO2  CBC Recent Labs  Lab 07/23/19 0351 07/23/19 1843 07/24/19 0425 07/26/19 0352  HGB 6.9* 9.4* 8.7* 9.6*  HCT 22.6* 30.4* 28.5* 29.7*  WBC 6.7  --  7.8 9.2  PLT 244  --  226 217    COAGULATION No results for input(s): INR in the last 168 hours.  CARDIAC  No results for input(s): TROPONINI in the last 168 hours. No results for input(s): PROBNP in the last 168 hours.   CHEMISTRY Recent Labs  Lab 07/22/19 0350 07/23/19 0351 07/24/19 0424 07/25/19 0257 07/25/19 1851 07/26/19 0352  NA 139 136 135 134* 133* 133*  K 4.1 4.5 4.5 4.9 5.4* 4.6  CL 93* 92* 95* 93* 94* 95*  CO2 33* 30 30 25 23 25   GLUCOSE 113* 112* 111* 123* 116* 126*  BUN 98* 105* 102* 103* 93* 64*  CREATININE 5.55* 6.41* 6.97* 7.98* 7.08* 5.54*  CALCIUM 8.3* 7.8* 7.5* 7.5* 7.6* 7.5*  MG 1.6*  --   --  1.4*  --  1.9  PHOS  --  5.6* 6.3* 7.3* 6.9* 4.8*   Estimated Creatinine Clearance: 8  mL/min (A) (by C-G formula based on SCr of 5.54 mg/dL (H)).   LIVER Recent Labs  Lab 07/20/19 0722 07/23/19 0351 07/24/19 0424 07/25/19 1851 07/26/19 0352  AST 14*  --   --   --   --   ALT 9  --   --   --   --   ALKPHOS 64  --   --   --   --   BILITOT 1.3*  --   --   --   --   PROT 7.0  --   --   --   --   ALBUMIN 2.3* 1.9* 2.0* 2.0* 1.7*     INFECTIOUS No results for input(s): LATICACIDVEN, PROCALCITON in the last 168 hours.   ENDOCRINE CBG (last 3)  Recent Labs    07/25/19 1732 07/25/19 2231 07/26/19 0632  GLUCAP 104* 137* 98         IMAGING x48h  - image(s)  personally visualized  -   highlighted in bold DG CHEST PORT 1 VIEW  Result Date: 07/25/2019 CLINICAL DATA:  Central line placement. EXAM: PORTABLE CHEST 1 VIEW COMPARISON:  Chest x-ray dated July 17, 2019. FINDINGS: The patient is rotated to the right. Unchanged right chest wall port catheter with tip in the SVC. Grossly stable cardiomediastinal silhouette accounting for rotation. No focal consolidation, pleural effusion, or pneumothorax. No acute osseous abnormality. IMPRESSION: 1. Unchanged right chest wall port catheter. No new central line identified. 2. No active disease. Electronically Signed   By: Titus Dubin M.D.   On: 07/25/2019 13:08   Critical care time: The patient is critically ill with multiple organ systems failure and requires high complexity decision making for assessment and support, frequent evaluation and titration of therapies, application of advanced monitoring technologies and extensive interpretation of multiple databases.  Critical care time 34 mins. This represents my time independent of the NPs time taking care of the pt. This is excluding procedures.    Audria Nine DO [facility] 07/26/2019, 10:10 AM

## 2019-07-26 NOTE — Progress Notes (Signed)
Progress Note  Patient Name: [name] Date of Encounter: 07/26/2019  Primary Cardiologist:   [name], MD   Subjective   She denies pain or SOB.   Inpatient Medications    Scheduled Meds: . sodium chloride   Intravenous Once  . allopurinol  100 mg Oral Daily  . amiodarone  200 mg Oral BID   Followed by  . [START ON 07/30/2019] amiodarone  200 mg Oral Daily  . atorvastatin  40 mg Oral QHS  . Chlorhexidine Gluconate Cloth  6 each Topical Daily  . darbepoetin (ARANESP) injection - NON-DIALYSIS  150 mcg Subcutaneous Q Thu-1800  . famotidine  20 mg Oral QHS  . umeclidinium bromide  1 puff Inhalation Daily   And  . fluticasone furoate-vilanterol  1 puff Inhalation Daily  . heparin  5,000 Units Subcutaneous Q8H  . insulin aspart  0-9 Units Subcutaneous TID WC  . insulin glargine  5 Units Subcutaneous QHS  . metoprolol tartrate  25 mg Oral BID  . pantoprazole  40 mg Oral BID AC  . sertraline  50 mg Oral Daily  . sodium chloride flush  10-40 mL Intracatheter Q12H  . sodium chloride flush  3 mL Intravenous Q12H   Continuous Infusions: .  prismasol BGK 4/2.5 500 mL (07/26/19 0020)  .  prismasol BGK 4/2.5 300 mL/hr at 07/26/19 0835  . sodium chloride Stopped (07/26/19 0920)  . ciprofloxacin 200 mL/hr at 07/26/19 1000  . prismasol BGK 4/2.5 1,000 mL/hr (07/26/19 0702)   PRN Meds: sodium chloride, acetaminophen **OR** acetaminophen, albuterol, heparin, heparin, sodium chloride flush, sodium chloride flush   Vital Signs    Vitals:   07/26/19 0800 07/26/19 0900 07/26/19 0930 07/26/19 1000  BP: 99/85 (!) 84/33 (!) 91/32 (!) 95/56  Pulse: (!) 110 (!) 103 99 (!) 103  Resp: 16 (!) 26 12 (!) 22  Temp:    97.6 F (36.4 C)  TempSrc:    Oral  SpO2: 98% 99% 100% 100%  Weight:      Height:        Intake/Output Summary (Last 24 hours) at 07/26/2019 1024 Last data filed at 07/26/2019 1000 Gross per 24 hour  Intake 1417.91 ml  Output 1124 ml  Net 293.91 ml    Filed Weights   07/24/19 2130 07/25/19 0500 07/26/19 0600  Weight: 87.6 kg 87.6 kg 87 kg    Telemetry    Atrial fib with controlled rate - Personally Reviewed  ECG    NA - Personally Reviewed  Physical Exam   GEN: No acute distress.   Neck: No  JVD Cardiac: Irregular RR, no murmurs, rubs, or gallops.  Respiratory: Clear  to auscultation bilaterally. GI: Soft, nontender, non-distended  MS: No  edema; No deformity. Neuro:  Nonfocal  Psych: Normal affect   Labs    Chemistry Recent Labs  Lab 07/20/19 0722 07/22/19 0350 07/24/19 0424 07/25/19 0257 07/25/19 1851 07/26/19 0352  NA 143  --  135 134* 133* 133*  K 4.1  --  4.5 4.9 5.4* 4.6  CL 96*  --  95* 93* 94* 95*  CO2 33*  --  30 25 23 25   GLUCOSE 148*  --  111* 123* 116* 126*  BUN 87*  --  102* 103* 93* 64*  CREATININE 3.37*  --  6.97* 7.98* 7.08* 5.54*  CALCIUM 8.6*  --  7.5* 7.5* 7.6* 7.5*  PROT 7.0  --   --   --   --   --  ALBUMIN 2.3*   < > 2.0*  --  2.0* 1.7*  AST 14*  --   --   --   --   --   ALT 9  --   --   --   --   --   ALKPHOS 64  --   --   --   --   --   BILITOT 1.3*  --   --   --   --   --   GFRNONAA 13*  --  5* 5* 5* 7*  GFRAA 15*  --  6* 5* 6* 8*  ANIONGAP 14  --  10 16* 16* 13   < > = values in this interval not displayed.     Hematology Recent Labs  Lab 07/23/19 0351 07/23/19 1843 07/24/19 0425 07/26/19 0352  WBC 6.7  --  7.8 9.2  RBC 2.47*  --  3.21* 3.40*  HGB 6.9* 9.4* 8.7* 9.6*  HCT 22.6* 30.4* 28.5* 29.7*  MCV 91.5  --  88.8 87.4  MCH 27.9  --  27.1 28.2  MCHC 30.5  --  30.5 32.3  RDW 16.6*  --  17.0* 17.0*  PLT 244  --  226 217    Cardiac EnzymesNo results for input(s): TROPONINI in the last 168 hours. No results for input(s): TROPIPOC in the last 168 hours.   BNPNo results for input(s): BNP, PROBNP in the last 168 hours.   DDimer No results for input(s): DDIMER in the last 168 hours.   Radiology    DG CHEST PORT 1 VIEW  Result Date: 07/25/2019 CLINICAL DATA:   Central line placement. EXAM: PORTABLE CHEST 1 VIEW COMPARISON:  Chest x-ray dated July 17, 2019. FINDINGS: The patient is rotated to the right. Unchanged right chest wall port catheter with tip in the SVC. Grossly stable cardiomediastinal silhouette accounting for rotation. No focal consolidation, pleural effusion, or pneumothorax. No acute osseous abnormality. IMPRESSION: 1. Unchanged right chest wall port catheter. No new central line identified. 2. No active disease. Electronically Signed   By: Titus Dubin M.D.   On: 07/25/2019 13:08    Cardiac Studies   Echo:  Echo 06/16/19 1. Left ventricular ejection fraction, by visual estimation, is 55 to 60%. The left ventricle has normal function. There is no left ventricular hypertrophy. 2. Elevated left atrial pressure. 3. Left ventricular diastolic parameters are consistent with Grade II diastolic dysfunction (pseudonormalization). 4. Global right ventricle has normal systolic function.The right ventricular size is normal. No increase in right ventricular wall thickness. 5. Left atrial size was moderately dilated. 6. Right atrial size was normal. 7. The mitral valve was not well visualized. Trace mitral valve regurgitation. No evidence of mitral stenosis. 8. The tricuspid valve is normal in structure. Tricuspid valve regurgitation is mild. 9. The aortic valve was not well visualized. Aortic valve regurgitation is not visualized. No evidence of aortic valve sclerosis or stenosis. 10. The pulmonic valve was not well visualized. Pulmonic valve regurgitation not assessed. 11. The aortic root was not well visualized. 12. Moderately elevated pulmonary artery systolic pressure. 13. Moderate pulmonary HTN, PASP is 48 mmHg. 14. The inferior vena cava is normal in size with greater than 50% respiratory variability, suggesting right atrial pressure of 3 mmHg. 15. The interatrial septum was not well visualized.  Patient Profile     73  y.o. female with past medical history of chronic diastolic CHF, CAD (nonobstructive CAD by cath in 2000), HTN, HLD, Stage 4 CKD , COPD,  and OSA (on CPAP) presenting to the hospital after recent coffee-ground emesis with EGD showing mild erosive esophagitis and Schatzki ring requiring dilatation. She subsequently developed atrial fibrillation with RVR requiring further management.  Assessment & Plan    ATRIAL FIB WITH RVR:    Off of anticoagulation secondary to GI bleed.   Hgb is stable.  HR is OK given the situation.  Low BP will not allow med titration.   She was switched to PO amiodarone.  Continue PO beta blockers.    CHRONIC DIASTOLIC HF:  Volume managed per dialysis.     For questions or updates, please contact [place] Please consult [email or web address] for contact info under Cardiology/STEMI.   Signed, Minus Breeding, MD  07/26/2019, 10:24 AM

## 2019-07-26 NOTE — Progress Notes (Signed)
1708: Pt HR 140 and most recent BP 81/52 (62). Currently not pulling on CRRT.  Dr. Percival Spanish notified. Will restart IV Amio per verbal orders Dr. Percival Spanish.

## 2019-07-27 ENCOUNTER — Inpatient Hospital Stay (HOSPITAL_COMMUNITY): Payer: Medicare Other

## 2019-07-27 DIAGNOSIS — R579 Shock, unspecified: Secondary | ICD-10-CM

## 2019-07-27 DIAGNOSIS — I361 Nonrheumatic tricuspid (valve) insufficiency: Secondary | ICD-10-CM

## 2019-07-27 DIAGNOSIS — I340 Nonrheumatic mitral (valve) insufficiency: Secondary | ICD-10-CM

## 2019-07-27 LAB — RENAL FUNCTION PANEL
Albumin: 1.6 g/dL — ABNORMAL LOW (ref 3.5–5.0)
Albumin: 1.8 g/dL — ABNORMAL LOW (ref 3.5–5.0)
Anion gap: 9 (ref 5–15)
Anion gap: 8 (ref 5–15)
BUN: 24 mg/dL — ABNORMAL HIGH (ref 8–23)
BUN: 17 mg/dL (ref 8–23)
CO2: 27 mmol/L (ref 22–32)
CO2: 25 mmol/L (ref 22–32)
Calcium: 7.7 mg/dL — ABNORMAL LOW (ref 8.9–10.3)
Calcium: 7.6 mg/dL — ABNORMAL LOW (ref 8.9–10.3)
Chloride: 98 mmol/L (ref 98–111)
Chloride: 102 mmol/L (ref 98–111)
Creatinine, Ser: 2.86 mg/dL — ABNORMAL HIGH (ref 0.44–1.00)
Creatinine, Ser: 2.21 mg/dL — ABNORMAL HIGH (ref 0.44–1.00)
GFR calc Af Amer: 18 mL/min — ABNORMAL LOW (ref >60)
GFR calc Af Amer: 25 mL/min — ABNORMAL LOW (ref >60)
GFR calc non Af Amer: 16 mL/min — ABNORMAL LOW (ref >60)
GFR calc non Af Amer: 21 mL/min — ABNORMAL LOW (ref >60)
Glucose, Bld: 104 mg/dL — ABNORMAL HIGH (ref 70–99)
Glucose, Bld: 133 mg/dL — ABNORMAL HIGH (ref 70–99)
Phosphorus: 2.7 mg/dL (ref 2.5–4.6)
Phosphorus: 1.9 mg/dL — ABNORMAL LOW (ref 2.5–4.6)
Potassium: 4.4 mmol/L (ref 3.5–5.1)
Potassium: 4.2 mmol/L (ref 3.5–5.1)
Sodium: 134 mmol/L — ABNORMAL LOW (ref 135–145)
Sodium: 135 mmol/L (ref 135–145)

## 2019-07-27 LAB — ECHOCARDIOGRAM LIMITED
Height: 56 in
Weight: 3315.72 oz

## 2019-07-27 LAB — POCT ACTIVATED CLOTTING TIME
Activated Clotting Time: 136 seconds
Activated Clotting Time: 131 seconds

## 2019-07-27 LAB — GLUCOSE, CAPILLARY
Glucose-Capillary: 93 mg/dL (ref 70–99)
Glucose-Capillary: 89 mg/dL (ref 70–99)
Glucose-Capillary: 139 mg/dL — ABNORMAL HIGH (ref 70–99)
Glucose-Capillary: 131 mg/dL — ABNORMAL HIGH (ref 70–99)
Glucose-Capillary: 130 mg/dL — ABNORMAL HIGH (ref 70–99)

## 2019-07-27 LAB — MAGNESIUM: Magnesium: 2 mg/dL (ref 1.7–2.4)

## 2019-07-27 MED ORDER — SODIUM CHLORIDE 0.9 % IV SOLN
250.0000 [IU]/h | INTRAVENOUS | Status: DC
  Administered 2019-07-27: 250 [IU]/h via INTRAVENOUS_CENTRAL
  Administered 2019-07-28 (×2): 1050 [IU]/h via INTRAVENOUS_CENTRAL
  Administered 2019-07-28 – 2019-07-29 (×5): 1650 [IU]/h via INTRAVENOUS_CENTRAL
  Administered 2019-07-29: 1700 [IU]/h via INTRAVENOUS_CENTRAL
  Filled 2019-07-27 (×9): qty 2

## 2019-07-27 MED ORDER — HEPARIN BOLUS VIA INFUSION (CRRT)
1000.0000 [IU] | INTRAVENOUS | Status: DC | PRN
  Administered 2019-07-27 (×2): 1000 [IU] via INTRAVENOUS_CENTRAL
  Filled 2019-07-27: qty 1000

## 2019-07-27 MED ORDER — PHENYLEPHRINE HCL-NACL 10-0.9 MG/250ML-% IV SOLN
0.0000 ug/min | INTRAVENOUS | Status: DC
  Administered 2019-07-27: 25 ug/min via INTRAVENOUS
  Administered 2019-07-27: 70 ug/min via INTRAVENOUS
  Administered 2019-07-27: 60 ug/min via INTRAVENOUS
  Administered 2019-07-27: 130 ug/min via INTRAVENOUS
  Administered 2019-07-27: 120 ug/min via INTRAVENOUS
  Administered 2019-07-27: 100 ug/min via INTRAVENOUS
  Administered 2019-07-27: 20 ug/min via INTRAVENOUS
  Filled 2019-07-27 (×7): qty 250

## 2019-07-27 NOTE — Progress Notes (Signed)
eLink Physician-Brief Progress Note Patient Name: TENNYSON KALLEN DOB: 1946-07-31 MRN: [id number]   Date of Service  07/27/2019  HPI/Events of Note  Hypotension - BP = 80/30. No able to pull fluid on CRRT.  eICU Interventions  Will order: 1. Phenylephrine IV infusion. Titrate to MAP >= 65.      Intervention Category Major Interventions: Hypotension - evaluation and management  Hazelee Harbold Eugene 07/27/2019, 3:50 AM

## 2019-07-27 NOTE — Progress Notes (Signed)
[facility] ROUNDING NOTE   Subjective:   This is a 73 year old lady with a history of diastolic heart failure, COPD, chronic kidney disease stage III/IV baseline serum creatinine appears to be at 3 mg/dL., obesity, obstructive sleep apnea, atrial fibrillation on chronic anticoagulation treatment, gastroesophageal reflux disease, hiatal hernia, gout, diabetes mellitus type 2.  She presented to [facility] 07/17/2019 with coffee-ground emesis EGD showed esophagitis but no active bleeding ulcers.  She also was found to have Enterobacter urinary tract infection and started on ciprofloxacin.  During her hospital stay she developed atrial fibrillation rapid ventricular rate and required amiodarone.    She was transferred to [facility] 07/25/2019 with acute on chronic kidney disease thought to be secondary to hypotension due to the atrial fibrillation and for the initiation of CRRT.. .  She will underwent insertion of right femoral triple-lumen catheter by critical care medicine 07/25/2019.  Appreciate assistance.  Urinalysis showed 100 mg/dL protein 6-10 RBCs and greater than 50 WBCs.  Culture positive for Enterobacter.  Blood pressure 99/64 pulse 124 temperature 97.8  IV amiodarone IV ciprofloxacin IV phenylephrine  Sodium 134 potassium 4.4 chloride 98 CO2 27 BUN 24 creatinine 2.86 glucose 104 calcium 7.7 phosphorus 2.7 WBC 9.2 hemoglobin 9.6 platelets 217  Results of abdominal CT 07/17/2019  stable large hiatal hernia with majority of the stomach in the chest.Increased bibasilar atelectasisRight middle lobe 5 mm pulmonary nodules remain indeterminate.    Objective:  Vital signs in last 24 hours:  Temp:  [97.3 F (36.3 C)-97.8 F (36.6 C)] 97.8 F (36.6 C) (12/22 0755) Pulse Rate:  [89-140] 124 (12/22 0930) Resp:  [12-28] 18 (12/22 0930) BP: (52-115)/(28-102) 99/64 (12/22 0930) SpO2:  [90 %-100 %] 94 % (12/22 0930) Weight:  [94 kg] 94 kg (12/22  0500)  Weight change: 7 kg Filed Weights   07/25/19 0500 07/26/19 0600 07/27/19 0500  Weight: 87.6 kg 87 kg 94 kg    Intake/Output: I/O last 3 completed shifts: In: 2586.5 [P.O.:1140; I.V.:713.9; Other:120; IV Piggyback:612.7] Out: 840 [Urine:35; Other:805]   Intake/Output this shift:  Total I/O In: 123.4 [I.V.:123.4] Out: 97 [Other:97]  Gen: chronically ill appearing, obese WF CVS: tachycardic Resp: cta Abd: +BS,soft, NT/ND Ext: no edema   Basic Metabolic Panel: Recent Labs  Lab 07/22/19 0350 07/25/19 0257 07/25/19 1851 07/26/19 0352 07/26/19 1841 07/27/19 0445  NA 139 134* 133* 133* 134* 134*  K 4.1 4.9 5.4* 4.6 4.5 4.4  CL 93* 93* 94* 95* 100 98  CO2 33* 25 23 25 28 27   GLUCOSE 113* 123* 116* 126* 171* 104*  BUN 98* 103* 93* 64* 37* 24*  CREATININE 5.55* 7.98* 7.08* 5.54* 3.71* 2.86*  CALCIUM 8.3* 7.5* 7.6* 7.5* 7.4* 7.7*  MG 1.6* 1.4*  --  1.9  --  2.0  PHOS  --  7.3* 6.9* 4.8* 3.1 2.7    Liver Function Tests: Recent Labs  Lab 07/24/19 0424 07/25/19 1851 07/26/19 0352 07/26/19 1841 07/27/19 0445  ALBUMIN 2.0* 2.0* 1.7* 1.7* 1.6*   No results for input(s): LIPASE, AMYLASE in the last 168 hours. No results for input(s): AMMONIA in the last 168 hours.  CBC: Recent Labs  Lab 07/22/19 0350 07/22/19 1143 07/23/19 0351 07/23/19 1843 07/24/19 0425 07/26/19 0352  WBC 7.0  --  6.7  --  7.8 9.2  HGB 7.3* 7.6* 6.9* 9.4* 8.7* 9.6*  HCT 24.0* 25.0* 22.6* 30.4* 28.5* 29.7*  MCV 91.6  --  91.5  --  88.8 87.4  PLT  286  --  244  --  226 217    Cardiac Enzymes: No results for input(s): CKTOTAL, CKMB, CKMBINDEX, TROPONINI in the last 168 hours.  BNP: Invalid input(s): POCBNP  CBG: Recent Labs  Lab 07/26/19 1135 07/26/19 1639 07/26/19 2123 07/27/19 0617 07/27/19 0758  GLUCAP 107* 89 125* 93 5    Microbiology: Results for orders placed or performed during the hospital encounter of 07/17/19  Urine culture     Status: Abnormal   Collection  Time: 07/17/19  9:40 AM   Specimen: Urine, Clean Catch  Result Value Ref Range Status   Specimen Description   Final    URINE, CLEAN CATCH Performed at [facility], [address], [place], [place] [address]    Special Requests   Final    NONE Performed at [facility], [address], [place], [place] [address]    Culture >=100,000 COLONIES/mL ENTEROBACTER AEROGENES (A)  Final   Report Status 07/20/2019 FINAL  Final   Organism ID, Bacteria ENTEROBACTER AEROGENES (A)  Final      Susceptibility   Enterobacter aerogenes - MIC*    CEFAZOLIN >=64 RESISTANT Resistant     CEFTRIAXONE <=1 SENSITIVE Sensitive     CIPROFLOXACIN <=0.25 SENSITIVE Sensitive     GENTAMICIN <=1 SENSITIVE Sensitive     IMIPENEM 1 SENSITIVE Sensitive     NITROFURANTOIN 64 INTERMEDIATE Intermediate     TRIMETH/SULFA <=20 SENSITIVE Sensitive     PIP/TAZO <=4 SENSITIVE Sensitive     * >=100,000 COLONIES/mL ENTEROBACTER AEROGENES  SARS CORONAVIRUS 2 (TAT 6-24 HRS) Nasopharyngeal Nasopharyngeal Swab     Status: None   Collection Time: 07/17/19  2:31 PM   Specimen: Nasopharyngeal Swab  Result Value Ref Range Status   SARS Coronavirus 2 NEGATIVE NEGATIVE Final    Comment: (NOTE) SARS-CoV-2 target nucleic acids are NOT DETECTED. The SARS-CoV-2 RNA is generally detectable in upper and lower respiratory specimens during the acute phase of infection. Negative results do not preclude SARS-CoV-2 infection, do not rule out co-infections with other pathogens, and should not be used as the sole basis for treatment or other patient management decisions. Negative results must be combined with clinical observations, patient history, and epidemiological information. The expected result is Negative. Fact Sheet for Patients: SugarRoll.be Fact Sheet for Healthcare Providers: https://www.woods-mathews.com/ This test is not yet approved or cleared by the [place] FDA and  has been  authorized for detection and/or diagnosis of SARS-CoV-2 by FDA under an Emergency Use Authorization (EUA). This EUA will remain  in effect (meaning this test can be used) for the duration of the COVID-19 declaration under Section 56 4(b)(1) of the Act, 21 U.S.C. section 360bbb-3(b)(1), unless the authorization is terminated or revoked sooner. Performed at [facility], [place] [address] [place], [place] [address]   MRSA PCR Screening     Status: None   Collection Time: 07/17/19 10:00 PM   Specimen: Nasal Mucosa; Nasopharyngeal  Result Value Ref Range Status   MRSA by PCR NEGATIVE NEGATIVE Final    Comment:        The GeneXpert MRSA Assay (FDA approved for NASAL specimens only), is one component of a comprehensive MRSA colonization surveillance program. It is not intended to diagnose MRSA infection nor to guide or monitor treatment for MRSA infections. Performed at [facility], [address] [place], [place] [address]   Novel Coronavirus, NAA (hospital order; send-out to ref lab)     Status: None   Collection Time: 07/21/19 11:00  AM   Specimen: Nasopharyngeal Swab; Respiratory  Result Value Ref Range Status   SARS-CoV-2, NAA NOT DETECTED NOT DETECTED Final    Comment: (NOTE) This nucleic acid amplification test was developed and its performance characteristics determined by [employer]. Nucleic acid amplification tests include PCR and TMA. This test has not been FDA cleared or approved. This test has been authorized by FDA under an Emergency Use Authorization (EUA). This test is only authorized for the duration of time the declaration that circumstances exist justifying the authorization of the emergency use of in vitro diagnostic tests for detection of SARS-CoV-2 virus and/or diagnosis of COVID-19 infection under section 564(b)(1) of the Act, 21 U.S.C. 664QIH-4(V) (1), unless the authorization is terminated or revoked sooner. When diagnostic testing is  negative, the possibility of a false negative result should be considered in the context of a patient's recent exposures and the presence of clinical signs and symptoms consistent with COVID-19. An individual without symptoms of COVID- 19 and who is not shedding SARS-CoV-2 vi rus would expect to have a negative (not detected) result in this assay. Performed At: [facility] [address] [place], [place] [id number] Katina Degree MDPhD FI:[id number]    Coronavirus Source NASOPHARYNGEAL  Final    Comment: Performed at [facility], [address] [place], [place] [address]    Coagulation Studies: No results for input(s): LABPROT, INR in the last 72 hours.  Urinalysis: No results for input(s): COLORURINE, LABSPEC, PHURINE, GLUCOSEU, HGBUR, BILIRUBINUR, KETONESUR, PROTEINUR, UROBILINOGEN, NITRITE, LEUKOCYTESUR in the last 72 hours.  Invalid input(s): APPERANCEUR    Imaging: DG CHEST PORT 1 VIEW  Result Date: 07/25/2019 CLINICAL DATA:  Central line placement. EXAM: PORTABLE CHEST 1 VIEW COMPARISON:  Chest x-ray dated July 17, 2019. FINDINGS: The patient is rotated to the right. Unchanged right chest wall port catheter with tip in the SVC. Grossly stable cardiomediastinal silhouette accounting for rotation. No focal consolidation, pleural effusion, or pneumothorax. No acute osseous abnormality. IMPRESSION: 1. Unchanged right chest wall port catheter. No new central line identified. 2. No active disease. Electronically Signed   By: Titus Dubin M.D.   On: 07/25/2019 13:08     Medications:   .  prismasol BGK 4/2.5 500 mL/hr at 07/27/19 0925  .  prismasol BGK 4/2.5 300 mL/hr at 07/27/19 3016  . sodium chloride 250 mL (07/27/19 0332)  . amiodarone 30 mg/hr (07/27/19 0900)  . ciprofloxacin 400 mg (07/27/19 0955)  . heparin 10,000 units/ 20 mL infusion syringe    . phenylephrine (NEO-SYNEPHRINE) Adult infusion 30 mcg/min (07/27/19 0900)  . prismasol BGK 4/2.5 1,000 mL/hr at  07/27/19 0544   . sodium chloride   Intravenous Once  . allopurinol  100 mg Oral Daily  . atorvastatin  40 mg Oral QHS  . Chlorhexidine Gluconate Cloth  6 each Topical Daily  . darbepoetin (ARANESP) injection - NON-DIALYSIS  150 mcg Subcutaneous Q Thu-1800  . umeclidinium bromide  1 puff Inhalation Daily   And  . fluticasone furoate-vilanterol  1 puff Inhalation Daily  . heparin  5,000 Units Subcutaneous Q8H  . insulin aspart  0-9 Units Subcutaneous TID WC  . insulin glargine  5 Units Subcutaneous QHS  . metoprolol tartrate  25 mg Oral BID  . pantoprazole  40 mg Oral BID AC  . sertraline  50 mg Oral Daily  . sodium chloride flush  10-40 mL Intracatheter Q12H  . sodium chloride flush  3 mL Intravenous Q12H   sodium chloride, acetaminophen **OR**  acetaminophen, albuterol, heparin, heparin, heparin, sodium chloride flush, sodium chloride flush  Assessment/ Plan:  1. AKI/CKD stage 4- in setting of atrial fibrillation, hypotension, h/o NSAIDs, and ABLA due to UGIB. She remains oliguric. Also with h/o bladder outlet obstruction. CRRT initiated 07/25/2019.  Difficulty with clotting filter I discussed with nurse will add heparin to system.  Continue to follow hemoglobin closely 2. Hematemesis/UGIB- seen by GI and EGD with erosive reflux esophagitis. Transfused 2 units PRBC's on 07/23/19.  No heparin with CVVHD.  Continues on Protonix 40 mg twice daily  3. Atrial fibrillation with RVR and ongoing hypotension- on amiodarone drip. Continues on metoprolol 25 mg twice daily.  No anticoagulation secondary to GI bleed.  Was on apixaban as an outpatient 4. UTI -Enterobacter  on cipro day 4/7 5. Diastolic congestive heart failure torsemide being held.  Home dose 50 mg daily 6. Depression Zoloft 50 mg daily 7. Gout allopurinol 100 mg daily 8. Diabetes mellitus per primary team 9. Hyperlipidemia continue Lipitor.    LOS: [place] @TODAY @10 :13 AM

## 2019-07-27 NOTE — Progress Notes (Signed)
NAME:  [name], MRN:  [id number], DOB:  1946/06/20, LOS: 24 ADMISSION DATE:  07/17/2019, CONSULTATION DATE:  07/25/2019 REFERRING MD:  Forestine Na, CHIEF COMPLAINT:  AKI   Brief History    73 yo F with a history of diastolic heart failure, CKD stage IIIb, COPD, chronic respiratory failure, obesity, OSA, Afib (on chronic anticoagulation), GERD, hiatal hernia, gout, DM2, and chronic anemia who presented to [facility] on 12/12 after an episode of coffee ground emesis, nausea and vomiting.  She had an EGD done that showed esophagitis but no active bleeding ulcers.  She was also started on ciprofloaxinc for a UTI.  Unfortunately, throughout her hospital stay, he developed afib with RVR, started on amiodarone, and has had progressive renal failure, and has been anuric the last 24 hours.  She was transferred from [facility] to [facility] for consideration for CRRT.  At the bedside, patient is mentating appropriately and is in no distress.  Denies any significant shortness of breath.    Past Medical History   has a past medical history of Blood transfusion without reported diagnosis, CAD (coronary artery disease), Cataract, Cervical cancer ([place]) (1978), CHF (congestive heart failure) ([place]), Chronic back pain, Chronic kidney disease, COPD (chronic obstructive pulmonary disease) ([place]), Degenerative disc disease, Essential hypertension, benign, GERD (gastroesophageal reflux disease), Gout, Hiatal hernia (07/27/2013), History of diverticulitis of colon, History of hiatal hernia, Iron deficiency anemia, Irritable bowel syndrome, Lumbar radiculopathy, Mixed hyperlipidemia, Moderate major depression, single episode ([place]) (07/21/2019), Neuropathy, Osteoporosis, Ovarian cancer ([place]) (1978), Oxygen deficiency, Sleep apnea, Type 2 diabetes mellitus ([place]), Vitamin B deficiency (12/25/2009), and Vitamin B12 deficiency.   has a past surgical history that includes Esophagogastroduodenoscopy (11/19/2006); Colonoscopy  (10/01/2006); Cholecystectomy; Partial hysterectomy (1978); Tonsillectomy and adenoidectomy; Umbilical hernia repair (2010); Benign breast cysts; Two back surgeries/fusion; Abdominal hysterectomy; Back surgery; Esophagogastroduodenoscopy (10/01/2006); Esophagogastroduodenoscopy (2011); Colonoscopy (2011); small bowel capsule (2008); Esophagogastroduodenoscopy (N/A, 08/06/2013); Givens capsule study (N/A, 08/06/2013); Knee surgery (Right); Colonoscopy (N/A, 01/26/2016); Spine surgery; and Esophagogastroduodenoscopy (egd) with propofol (N/A, 07/19/2019).   Significant Hospital Events   12/14 EGD: esophagitis, Schatzki ring dilation 12/15 Afib with RVR: started on amiodarone 12/16: AKI on CKD, renal consulted initially 12/22: started on neo gtt early this am  Consults:  Cardiology Nephrology GI  Procedures:  EGD 2/14  Significant Diagnostic Tests:  12/12 CT A/P without contrast: stable large hiatal hernia with majority of the stomach in the Chest. Increased bibasilar atelectasis. Right middle lobe 5 mm pulmonary nodules remain indeterminate.  Diverticulosis without acute inflammatory process. Improved body anasarca  Micro Data:  COVID-19 12/16--> negative Urine culture 12/12--> enterobacter aerogenes.   Antimicrobials:  Ciprofloxacin 12/17->  Interim history/subjective:    07/27/2019-on neo as of 0400 this am 2/2 hypotension and inability to pull volume with crrt. Unsure if needed to pull volume with pt on baseline oxygen, without pulm edema or peripheral edema. See wt's are up but unsure if accurate. Ordered echo to determine LV/RV function and help guide management.   12/21L remains on amio gtt. Not on pressors but on crrt and bp marginal today.   12/20: not on vent. Not on pressors. On amio gtt for a fib. HR well controlled. Has OSA and cPAP at home but not here. No active bleeding per RN. Has 150cc urine in bladder as of this morning 6am. Normotensive. CRRT/HD not started. No  catheter. CREAT 8 and up from 7. Per renal -> needs HD cath and CVVHD  Objective   Blood pressure (!) 52/28, pulse  95, temperature 97.8 F (36.6 C), temperature source Oral, resp. rate 13, height 4\' 8"  (1.422 m), weight 94 kg, SpO2 99 %.        Intake/Output Summary (Last 24 hours) at 07/27/2019 0815 Last data filed at 07/27/2019 0800 Gross per 24 hour  Intake 1572.73 ml  Output 261 ml  Net 1311.73 ml   Filed Weights   07/25/19 0500 07/26/19 0600 07/27/19 0500  Weight: 87.6 kg 87 kg 94 kg    Examination:  General Appearance:  Chronically ill appearing, resting comfortably on crrt Head:  Normocephalic, without obvious abnormality, atraumatic Eyes:  PERRL - yes, conjunctiva/corneas -clear Throat:  mmmp Neck:  Supple,  No enlargement/tenderness/nodules Lungs: Clear to auscultation bilaterally,  Heart:  S1 and S2 normal, no murmur,  Abdomen:  Soft, no masses, no organomegaly Genitalia / Rectal:  Not done Extremities:  No c/c trace BL E edema and no upper extremity edema Skin:  intact in exposed areas . Neurologic:  arousable and follows commands. conversant     Resolved Hospital Problem list     Assessment & Plan:  73 yo F who presented with UGIB due to esophagitis in the setting of GERD and meloxicam use, hospital course complicated with Afib with RVR, and now with worsening AKI on CKD, transferred from [facility] to [facility] for consideration for CRRT.    # AKI on CKD4: 2/2 afib, hypotension, NSAIDs and ABLA due to UGIB oliguiric  Plan  - on CRRT  #shock, undifferentiated:  -on neo this am.  -titrate to map >65 -limited echo to eval LV and RV at this time to help guide management. Has hd catheter but in femoral position making coox/cvp inaccurate.   #chronic hypoxic resp failure:  On 2L at this time.   # Afib:  Currently rate controlled plan - remains on IV amio.  -cont BB - will hold off on therapeutic AC for now since had recent GI bleed.    # Acute  on chronic anemia # Iron deficiency anemia -no further bleeding noted -cont heparin subcut without plans of heparin gtt at this time.    # UGIB due to esophagitis PLAN - ppi BID - iron supplementation on discharge - cbc in am - DVT ppx  # UTI, enterobacter aerogenes - cipro for 7 days  # Chronic diastolic CHF:  - holding torsemide, followup with nephrology - attempting to pull volume with crrt which is why pressors added. Unsure if necessary as described above. Awaiting echo  # COPD: no exacerbation, continue home inhalers and O2  # Gout: continue allopurinol, at reduced dose  #OSA - on home cPAP    Best practice:  Diet: Nephro  Pain/Anxiety/Delirium protocol (if indicated): n/a VAP protocol (if indicated): n/a DVT prophylaxis: SQH GI prophylaxis: ppi and pepcid Glucose control: lantus 5 and SSI Mobility: PT ordered Code Status: full Family Communication:updated pt  Disposition: ICU     LABS    PULMONARY No results for input(s): PHART, PCO2ART, PO2ART, HCO3, TCO2, O2SAT in the last 168 hours.  Invalid input(s): PCO2, PO2  CBC Recent Labs  Lab 07/23/19 0351 07/23/19 1843 07/24/19 0425 07/26/19 0352  HGB 6.9* 9.4* 8.7* 9.6*  HCT 22.6* 30.4* 28.5* 29.7*  WBC 6.7  --  7.8 9.2  PLT 244  --  226 217    COAGULATION No results for input(s): INR in the last 168 hours.  CARDIAC  No results for input(s): TROPONINI in the last 168 hours. No results for input(s):  PROBNP in the last 168 hours.   CHEMISTRY Recent Labs  Lab 07/22/19 0350 07/25/19 0257 07/25/19 1851 07/26/19 0352 07/26/19 1841 07/27/19 0445  NA 139 134* 133* 133* 134* 134*  K 4.1 4.9 5.4* 4.6 4.5 4.4  CL 93* 93* 94* 95* 100 98  CO2 33* 25 23 25 28 27   GLUCOSE 113* 123* 116* 126* 171* 104*  BUN 98* 103* 93* 64* 37* 24*  CREATININE 5.55* 7.98* 7.08* 5.54* 3.71* 2.86*  CALCIUM 8.3* 7.5* 7.6* 7.5* 7.4* 7.7*  MG 1.6* 1.4*  --  1.9  --  2.0  PHOS  --  7.3* 6.9* 4.8* 3.1 2.7    Estimated Creatinine Clearance: 16.2 mL/min (A) (by C-G formula based on SCr of 2.86 mg/dL (H)).   LIVER Recent Labs  Lab 07/24/19 0424 07/25/19 1851 07/26/19 0352 07/26/19 1841 07/27/19 0445  ALBUMIN 2.0* 2.0* 1.7* 1.7* 1.6*     INFECTIOUS No results for input(s): LATICACIDVEN, PROCALCITON in the last 168 hours.   ENDOCRINE CBG (last 3)  Recent Labs    07/26/19 2123 07/27/19 0617 07/27/19 0758  GLUCAP 125* 93 89         IMAGING x48h  - image(s) personally visualized  -   highlighted in bold DG CHEST PORT 1 VIEW  Result Date: 07/25/2019 CLINICAL DATA:  Central line placement. EXAM: PORTABLE CHEST 1 VIEW COMPARISON:  Chest x-ray dated July 17, 2019. FINDINGS: The patient is rotated to the right. Unchanged right chest wall port catheter with tip in the SVC. Grossly stable cardiomediastinal silhouette accounting for rotation. No focal consolidation, pleural effusion, or pneumothorax. No acute osseous abnormality. IMPRESSION: 1. Unchanged right chest wall port catheter. No new central line identified. 2. No active disease. Electronically Signed   By: Titus Dubin M.D.   On: 07/25/2019 13:08   Critical care time: The patient is critically ill with multiple organ systems failure and requires high complexity decision making for assessment and support, frequent evaluation and titration of therapies, application of advanced monitoring technologies and extensive interpretation of multiple databases.  Critical care time 37 mins. This represents my time independent of the NPs time taking care of the pt. This is excluding procedures.    Audria Nine DO [facility] 07/27/2019, 8:15 AM

## 2019-07-27 NOTE — Progress Notes (Signed)
Progress Note  Patient Name: [name] Date of Encounter: 07/27/2019  Primary Cardiologist:   [name], MD   Subjective   Awake, alert.  No complaints.  No pain.    Inpatient Medications    Scheduled Meds: . sodium chloride   Intravenous Once  . allopurinol  100 mg Oral Daily  . atorvastatin  40 mg Oral QHS  . Chlorhexidine Gluconate Cloth  6 each Topical Daily  . darbepoetin (ARANESP) injection - NON-DIALYSIS  150 mcg Subcutaneous Q Thu-1800  . umeclidinium bromide  1 puff Inhalation Daily   And  . fluticasone furoate-vilanterol  1 puff Inhalation Daily  . heparin  5,000 Units Subcutaneous Q8H  . insulin aspart  0-9 Units Subcutaneous TID WC  . insulin glargine  5 Units Subcutaneous QHS  . metoprolol tartrate  25 mg Oral BID  . pantoprazole  40 mg Oral BID AC  . sertraline  50 mg Oral Daily  . sodium chloride flush  10-40 mL Intracatheter Q12H  . sodium chloride flush  3 mL Intravenous Q12H   Continuous Infusions: .  prismasol BGK 4/2.5 500 mL/hr at 07/27/19 0110  .  prismasol BGK 4/2.5 300 mL/hr at 07/27/19 3893  . sodium chloride 250 mL (07/27/19 0332)  . amiodarone 30 mg/hr (07/27/19 0800)  . ciprofloxacin Stopped (07/26/19 2243)  . phenylephrine (NEO-SYNEPHRINE) Adult infusion 30 mcg/min (07/27/19 0800)  . prismasol BGK 4/2.5 1,000 mL/hr at 07/27/19 0544   PRN Meds: sodium chloride, acetaminophen **OR** acetaminophen, albuterol, heparin, heparin, sodium chloride flush, sodium chloride flush   Vital Signs    Vitals:   07/27/19 0755 07/27/19 0800 07/27/19 0827 07/27/19 0830  BP:  (!) 111/46  (!) 77/54  Pulse:  98 (!) 106 (!) 120  Resp:  13 16 18   Temp: 97.8 F (36.6 C)     TempSrc: Oral     SpO2:  100% 100% 100%  Weight:      Height:        Intake/Output Summary (Last 24 hours) at 07/27/2019 0849 Last data filed at 07/27/2019 0800 Gross per 24 hour  Intake 1572.73 ml  Output 261 ml  Net 1311.73 ml   Filed Weights   07/25/19  0500 07/26/19 0600 07/27/19 0500  Weight: 87.6 kg 87 kg 94 kg    Telemetry    Atrial fib, rate about 100 - Personally Reviewed  ECG    NA - Personally Reviewed  Physical Exam   GEN: No  acute distress.   Neck:    Unable to assess JVD Cardiac: Irregular RR, no murmurs, rubs, or gallops.  Respiratory:    Decreased breath sounds.   GI: Soft, nontender, non-distended, normal bowel sounds  MS:  Mild diffuse edema; No deformity. Neuro:   Nonfocal  Psych: Oriented and appropriate    Labs    Chemistry Recent Labs  Lab 07/26/19 0352 07/26/19 1841 07/27/19 0445  NA 133* 134* 134*  K 4.6 4.5 4.4  CL 95* 100 98  CO2 25 28 27   GLUCOSE 126* 171* 104*  BUN 64* 37* 24*  CREATININE 5.54* 3.71* 2.86*  CALCIUM 7.5* 7.4* 7.7*  ALBUMIN 1.7* 1.7* 1.6*  GFRNONAA 7* 11* 16*  GFRAA 8* 13* 18*  ANIONGAP 13 6 9      Hematology Recent Labs  Lab 07/23/19 0351 07/23/19 1843 07/24/19 0425 07/26/19 0352  WBC 6.7  --  7.8 9.2  RBC 2.47*  --  3.21* 3.40*  HGB 6.9* 9.4* 8.7* 9.6*  HCT  22.6* 30.4* 28.5* 29.7*  MCV 91.5  --  88.8 87.4  MCH 27.9  --  27.1 28.2  MCHC 30.5  --  30.5 32.3  RDW 16.6*  --  17.0* 17.0*  PLT 244  --  226 217    Cardiac EnzymesNo results for input(s): TROPONINI in the last 168 hours. No results for input(s): TROPIPOC in the last 168 hours.   BNPNo results for input(s): BNP, PROBNP in the last 168 hours.   DDimer No results for input(s): DDIMER in the last 168 hours.   Radiology    DG CHEST PORT 1 VIEW  Result Date: 07/25/2019 CLINICAL DATA:  Central line placement. EXAM: PORTABLE CHEST 1 VIEW COMPARISON:  Chest x-ray dated July 17, 2019. FINDINGS: The patient is rotated to the right. Unchanged right chest wall port catheter with tip in the SVC. Grossly stable cardiomediastinal silhouette accounting for rotation. No focal consolidation, pleural effusion, or pneumothorax. No acute osseous abnormality. IMPRESSION: 1. Unchanged right chest wall port  catheter. No new central line identified. 2. No active disease. Electronically Signed   By: Titus Dubin M.D.   On: 07/25/2019 13:08    Cardiac Studies   Echo:  Echo 06/16/19 1. Left ventricular ejection fraction, by visual estimation, is 55 to 60%. The left ventricle has normal function. There is no left ventricular hypertrophy. 2. Elevated left atrial pressure. 3. Left ventricular diastolic parameters are consistent with Grade II diastolic dysfunction (pseudonormalization). 4. Global right ventricle has normal systolic function.The right ventricular size is normal. No increase in right ventricular wall thickness. 5. Left atrial size was moderately dilated. 6. Right atrial size was normal. 7. The mitral valve was not well visualized. Trace mitral valve regurgitation. No evidence of mitral stenosis. 8. The tricuspid valve is normal in structure. Tricuspid valve regurgitation is mild. 9. The aortic valve was not well visualized. Aortic valve regurgitation is not visualized. No evidence of aortic valve sclerosis or stenosis. 10. The pulmonic valve was not well visualized. Pulmonic valve regurgitation not assessed. 11. The aortic root was not well visualized. 12. Moderately elevated pulmonary artery systolic pressure. 13. Moderate pulmonary HTN, PASP is 48 mmHg. 14. The inferior vena cava is normal in size with greater than 50% respiratory variability, suggesting right atrial pressure of 3 mmHg. 15. The interatrial septum was not well visualized.  Patient Profile     74 y.o. female with past medical history of chronic diastolic CHF, CAD (nonobstructive CAD by cath in 2000), HTN, HLD, Stage 4 CKD , COPD, and OSA (on CPAP) presenting to the hospital after recent coffee-ground emesis with EGD showing mild erosive esophagitis and Schatzki ring requiring dilatation. She subsequently developed atrial fibrillation with RVR requiring further management.  Assessment & Plan    ATRIAL  FIB WITH RVR:    HR increased off of IV amiodarone yesterday.  IV restarted.  Hypotensive and phenylephrine started.  BP is still very labile.   Continue supportive care.  Echo prelim repeat today.  EF still OK>  RV not particularly dilated or dysfunctional.  TR is more than trivial as was reported but pulmonary pressure as previously estimated at 43.  She might be volume dependent and I would suggest not pulling fluid as long as she is oxygenating well.  Continue IV amiodarone.    CHRONIC DIASTOLIC HF:  Volume managed per dialysis.   See above.   For questions or updates, please contact [place] Please consult [email or web address] for contact info under Cardiology/STEMI.   Signed,  Minus Breeding, MD  07/27/2019, 8:49 AM

## 2019-07-27 NOTE — Progress Notes (Signed)
  Echocardiogram 2D Echocardiogram limited has been performed.  [name] M 07/27/2019, 9:07 AM

## 2019-07-28 LAB — GLUCOSE, CAPILLARY
Glucose-Capillary: 119 mg/dL — ABNORMAL HIGH (ref 70–99)
Glucose-Capillary: 111 mg/dL — ABNORMAL HIGH (ref 70–99)
Glucose-Capillary: 113 mg/dL — ABNORMAL HIGH (ref 70–99)
Glucose-Capillary: 125 mg/dL — ABNORMAL HIGH (ref 70–99)

## 2019-07-28 LAB — RENAL FUNCTION PANEL
Albumin: 1.7 g/dL — ABNORMAL LOW (ref 3.5–5.0)
Albumin: 1.9 g/dL — ABNORMAL LOW (ref 3.5–5.0)
Anion gap: 8 (ref 5–15)
Anion gap: 6 (ref 5–15)
BUN: 11 mg/dL (ref 8–23)
BUN: 9 mg/dL (ref 8–23)
CO2: 26 mmol/L (ref 22–32)
CO2: 28 mmol/L (ref 22–32)
Calcium: 7.8 mg/dL — ABNORMAL LOW (ref 8.9–10.3)
Calcium: 8.2 mg/dL — ABNORMAL LOW (ref 8.9–10.3)
Chloride: 101 mmol/L (ref 98–111)
Chloride: 102 mmol/L (ref 98–111)
Creatinine, Ser: 1.99 mg/dL — ABNORMAL HIGH (ref 0.44–1.00)
Creatinine, Ser: 1.93 mg/dL — ABNORMAL HIGH (ref 0.44–1.00)
GFR calc Af Amer: 28 mL/min — ABNORMAL LOW (ref >60)
GFR calc Af Amer: 29 mL/min — ABNORMAL LOW (ref >60)
GFR calc non Af Amer: 24 mL/min — ABNORMAL LOW (ref >60)
GFR calc non Af Amer: 25 mL/min — ABNORMAL LOW (ref >60)
Glucose, Bld: 109 mg/dL — ABNORMAL HIGH (ref 70–99)
Glucose, Bld: 126 mg/dL — ABNORMAL HIGH (ref 70–99)
Phosphorus: 1.7 mg/dL — ABNORMAL LOW (ref 2.5–4.6)
Phosphorus: 1.4 mg/dL — ABNORMAL LOW (ref 2.5–4.6)
Potassium: 4.4 mmol/L (ref 3.5–5.1)
Potassium: 4.8 mmol/L (ref 3.5–5.1)
Sodium: 135 mmol/L (ref 135–145)
Sodium: 136 mmol/L (ref 135–145)

## 2019-07-28 LAB — POCT ACTIVATED CLOTTING TIME
Activated Clotting Time: 153 seconds
Activated Clotting Time: 153 seconds
Activated Clotting Time: 158 seconds
Activated Clotting Time: 158 seconds
Activated Clotting Time: 164 seconds
Activated Clotting Time: 164 seconds
Activated Clotting Time: 164 seconds
Activated Clotting Time: 169 seconds
Activated Clotting Time: 169 seconds
Activated Clotting Time: 180 seconds
Activated Clotting Time: 186 seconds
Activated Clotting Time: 191 seconds
Activated Clotting Time: 191 seconds
Activated Clotting Time: 197 seconds
Activated Clotting Time: 213 seconds

## 2019-07-28 LAB — CBC
HCT: 25.8 % — ABNORMAL LOW (ref 36.0–46.0)
Hemoglobin: 8 g/dL — ABNORMAL LOW (ref 12.0–15.0)
MCH: 27.1 pg (ref 26.0–34.0)
MCHC: 31 g/dL (ref 30.0–36.0)
MCV: 87.5 fL (ref 80.0–100.0)
Platelets: 177 10*3/uL (ref 150–400)
RBC: 2.95 MIL/uL — ABNORMAL LOW (ref 3.87–5.11)
RDW: 18 % — ABNORMAL HIGH (ref 11.5–15.5)
WBC: 8.3 10*3/uL (ref 4.0–10.5)
nRBC: 0.7 % — ABNORMAL HIGH (ref 0.0–0.2)

## 2019-07-28 LAB — PHOSPHORUS: Phosphorus: 1.6 mg/dL — ABNORMAL LOW (ref 2.5–4.6)

## 2019-07-28 LAB — MAGNESIUM: Magnesium: 2.1 mg/dL (ref 1.7–2.4)

## 2019-07-28 LAB — APTT: aPTT: 125 seconds — ABNORMAL HIGH (ref 24–36)

## 2019-07-28 MED ORDER — PRO-STAT SUGAR FREE PO LIQD
30.0000 mL | Freq: Two times a day (BID) | ORAL | Status: DC
  Administered 2019-07-30 – 2019-08-11 (×23): 30 mL via ORAL
  Filled 2019-07-28 (×26): qty 30

## 2019-07-28 MED ORDER — ENSURE ENLIVE PO LIQD
237.0000 mL | Freq: Two times a day (BID) | ORAL | Status: DC
  Administered 2019-07-28 – 2019-08-11 (×11): 237 mL via ORAL

## 2019-07-28 MED ORDER — B COMPLEX-C PO TABS
1.0000 | ORAL_TABLET | Freq: Every day | ORAL | Status: DC
  Administered 2019-07-28 – 2019-08-02 (×6): 1 via ORAL
  Filled 2019-07-28 (×6): qty 1

## 2019-07-28 NOTE — Progress Notes (Signed)
Initial Nutrition Assessment  DOCUMENTATION CODES:   Obesity unspecified  INTERVENTION:   Liberalize diet to REGULAR  Magic cup TID with meals, each supplement provides 290 kcal and 9 grams of protein  Ensure Enlive po BID, each supplement provides 350 kcal and 20 grams of protein  30 ml Prostat BID, each supplement provides 100 kcals and 15 grams protein.   Add b-complex with C   NUTRITION DIAGNOSIS:   Increased nutrient needs related to catabolic illness, acute illness as evidenced by estimated needs.  GOAL:   Patient will meet greater than or equal to 90% of their needs   MONITOR:   PO intake, Weight trends, Supplement acceptance, Labs, Skin  REASON FOR ASSESSMENT:   LOS(CRRT)    ASSESSMENT:   73 yo female admitted with hematemesis with UGI, AKI on CKD VI requiring CRRT. PMH includes CKD III, CHF, COPD, chronic respiratory failure, obesity, GERD, hiatal hernia, DM  12/12 CT abdomen with large hiatal hernia with majority of stomach in chest 12/13 Admitted 12/14 EGD with esophagitis, Schatzki ring dilation 12/20 CRRT initiated  Continues on CRRT, noted possibility of requiring long term access due to unlikely renal recovery, plan for PermCath  Recorded po intake 58% of meals on average. Pt reports appetite is fair.   Pt ate cereal with banana for breakfast this AM; noted pt only ordered tomato soup and applesauce for lunch today. Reinforced importance of adequate protein intake, especially while on CRRT. Pt agreeable to eating more protein and trying oral nutrition supplements  Noted height of 4 feet 8 inches in system; pt reports height of 4 feet 11 inches. Height adjusted. Pt unable to tell writer her UBW; reports it goes up and down  Labs: phosphorus 1.6 (L), potassium wdl Meds: ss novolog, lantus   Diet Order:  Heart Health/Carb Modified  EDUCATION NEEDS:   Education needs have been addressed  Skin:  Skin Assessment: Skin Integrity Issues: Skin  Integrity Issues:: Stage II Stage II: r. thigh  Last BM:  12/18  Height:   Ht Readings from Last 1 Encounters:  07/28/19 4\' 11"  (1.499 m)    Weight:   Wt Readings from Last 1 Encounters:  07/28/19 90.4 kg    Ideal Body Weight:  45.5 kg  BMI:  Body mass index is 40.25 kg/m.  Estimated Nutritional Needs:   Kcal:  1800-2000 kcals  Protein:  90-100 g  Fluid:  1000 mL plus UOP (more liberal while on CRRT)   Kerman Passey MS, RDN, LDN, CNSC [phone] Pager  [phone] Weekend/On-Call Pager

## 2019-07-28 NOTE — Progress Notes (Signed)
Progress Note  Patient Name: [name] Date of Encounter: 07/28/2019  Primary Cardiologist:   [name], MD   Subjective   No chest pain.  No SOB.  Looks much better today.   Inpatient Medications    Scheduled Meds: . sodium chloride   Intravenous Once  . allopurinol  100 mg Oral Daily  . atorvastatin  40 mg Oral QHS  . Chlorhexidine Gluconate Cloth  6 each Topical Daily  . darbepoetin (ARANESP) injection - NON-DIALYSIS  150 mcg Subcutaneous Q Thu-1800  . umeclidinium bromide  1 puff Inhalation Daily   And  . fluticasone furoate-vilanterol  1 puff Inhalation Daily  . heparin  5,000 Units Subcutaneous Q8H  . insulin aspart  0-9 Units Subcutaneous TID WC  . insulin glargine  5 Units Subcutaneous QHS  . metoprolol tartrate  25 mg Oral BID  . pantoprazole  40 mg Oral BID AC  . sertraline  50 mg Oral Daily  . sodium chloride flush  10-40 mL Intracatheter Q12H  . sodium chloride flush  3 mL Intravenous Q12H   Continuous Infusions: .  prismasol BGK 4/2.5 500 mL/hr at 07/27/19 2159  .  prismasol BGK 4/2.5 300 mL/hr at 07/28/19 0406  . sodium chloride 250 mL (07/28/19 0450)  . amiodarone 30 mg/hr (07/28/19 0700)  . ciprofloxacin Stopped (07/27/19 2243)  . heparin 10,000 units/ 20 mL infusion syringe 1,550 Units/hr (07/28/19 0651)  . phenylephrine (NEO-SYNEPHRINE) Adult infusion Stopped (07/28/19 0343)  . prismasol BGK 4/2.5 1,000 mL/hr at 07/28/19 0406   PRN Meds: sodium chloride, acetaminophen **OR** acetaminophen, albuterol, heparin, heparin, heparin, sodium chloride flush, sodium chloride flush   Vital Signs    Vitals:   07/28/19 0600 07/28/19 0615 07/28/19 0630 07/28/19 0645  BP: (!) 116/44 (!) 121/56 (!) 137/113 (!) 132/47  Pulse: 68 68 64 74  Resp: (!) 23 (!) 24 13 (!) 26  Temp:      TempSrc:      SpO2: 98% 99% 98% 95%  Weight:      Height:        Intake/Output Summary (Last 24 hours) at 07/28/2019 0742 Last data filed at 07/28/2019  0700 Gross per 24 hour  Intake 2473.22 ml  Output 2710 ml  Net -236.78 ml   Filed Weights   07/26/19 0600 07/27/19 0500 07/28/19 0432  Weight: 87 kg 94 kg 90.4 kg    Telemetry    RRR- Personally Reviewed  ECG    NA - Personally Reviewed  Physical Exam   GEN: No  acute distress.   Neck: No  JVD Cardiac: RRR, no murmurs, rubs, or gallops.  Respiratory: Clear   to auscultation bilaterally. GI: Soft, nontender, non-distended, normal bowel sounds  MS:  Mild edema; No deformity. Neuro:   Nonfocal  Psych: Oriented and appropriate    Labs    Chemistry Recent Labs  Lab 07/27/19 0445 07/27/19 1600 07/28/19 0420  NA 134* 135 135  K 4.4 4.2 4.4  CL 98 102 101  CO2 27 25 26   GLUCOSE 104* 133* 109*  BUN 24* 17 11  CREATININE 2.86* 2.21* 1.99*  CALCIUM 7.7* 7.6* 7.8*  ALBUMIN 1.6* 1.8* 1.7*  GFRNONAA 16* 21* 24*  GFRAA 18* 25* 28*  ANIONGAP 9 8 8      Hematology Recent Labs  Lab 07/24/19 0425 07/26/19 0352 07/28/19 0420  WBC 7.8 9.2 8.3  RBC 3.21* 3.40* 2.95*  HGB 8.7* 9.6* 8.0*  HCT 28.5* 29.7* 25.8*  MCV 88.8 87.4  87.5  MCH 27.1 28.2 27.1  MCHC 30.5 32.3 31.0  RDW 17.0* 17.0* 18.0*  PLT 226 217 177    Cardiac EnzymesNo results for input(s): TROPONINI in the last 168 hours. No results for input(s): TROPIPOC in the last 168 hours.   BNPNo results for input(s): BNP, PROBNP in the last 168 hours.   DDimer No results for input(s): DDIMER in the last 168 hours.   Radiology    ECHOCARDIOGRAM LIMITED  Result Date: 07/27/2019   ECHOCARDIOGRAM LIMITED REPORT   Patient Name:   [name] Date of Exam: 07/27/2019 Medical Rec #:  [id number]          Height:       56.0 in Accession #:    [id number]         Weight:       207.2 lb Date of Birth:  02/22/1946          BSA:          1.80 m Patient Age:    73 years           BP:           77/54 mmHg Patient Gender: F                  HR:           108 bpm. Exam Location:  Inpatient  Procedure: Limited Echo,  Limited Color Doppler and Cardiac Doppler Indications:    Cardiomyopathy-Unspecified 425.9 / I42.9  History:        Patient has prior history of Echocardiogram examinations, most                 recent 06/16/2019. Chronic kidney disease on dialysis.  Sonographer:    Darlina Sicilian RDCS Referring Phys: [id number] [place]  1. Left ventricular ejection fraction, by visual estimation, is 55 to 60%. The left ventricle has normal function. There is mildly increased left ventricular hypertrophy.  2. Left ventricular diastolic function could not be evaluated.  3. The left ventricle has no regional wall motion abnormalities.  4. Global right ventricle has normal systolic function.The right ventricular size is normal. No increase in right ventricular wall thickness.  5. Left atrial size was moderately dilated.  6. Right atrial size was mildly dilated.  7. Mild mitral annular calcification.  8. The mitral valve is degenerative. Trivial mitral valve regurgitation.  9. The tricuspid valve is grossly normal. Tricuspid valve regurgitation moderate. 10. The tricuspid valve was normal in structure. Tricuspid valve regurgitation moderate. 11. The aortic valve is grossly normal. Aortic valve regurgitation is not visualized. 12. Moderately elevated pulmonary artery systolic pressure. 13. The tricuspid regurgitant velocity is 3.12 m/s, and with an assumed right atrial pressure of 15 mmHg, the estimated right ventricular systolic pressure is moderately elevated at 54.0 mmHg. 14. The inferior vena cava is dilated in size with <50% respiratory variability, suggesting right atrial pressure of 15 mmHg. FINDINGS  Left Ventricle: Left ventricular ejection fraction, by visual estimation, is 55 to 60%. The left ventricle has normal function. The left ventricle has no regional wall motion abnormalities. The left ventricular internal cavity size was the left ventricle is normal in size. There is mildly increased left ventricular  wall thickness. Concentric left ventricular hypertrophy. The left ventricular diastology could not be evaluated due to atrial fibrillation. Left ventricular diastolic function could not be evaluated. Right Ventricle: The right ventricular size is normal. No increase in right ventricular wall thickness.  Global RV systolic function is has normal systolic function. The tricuspid regurgitant velocity is 3.12 m/s, and with an assumed right atrial pressure  of 15 mmHg, the estimated right ventricular systolic pressure is moderately elevated at 54.0 mmHg. Left Atrium: Left atrial size was moderately dilated. Right Atrium: Right atrial size was mildly dilated. Right atrial pressure is estimated at 15 mmHg. Pericardium: There is no evidence of pericardial effusion is seen. There is no evidence of pericardial effusion. Mitral Valve: The mitral valve is normal in structure. There is mild thickening of the mitral valve leaflet(s). There is mild calcification of the mitral valve leaflet(s). Mild mitral annular calcification. Mild to moderate mitral valve regurgitation, with centrally-directed jet. Tricuspid Valve: The tricuspid valve is normal in structure. Tricuspid valve regurgitation moderate. Venous: The inferior vena cava is dilated in size with less than 50% respiratory variability, suggesting right atrial pressure of 15 mmHg.  LEFT VENTRICLE          Normals PLAX 2D LVIDd:         4.47 cm  3.6 cm LVIDs:         2.80 cm  1.7 cm LV PW:         1.23 cm  1.4 cm LV IVS:        1.18 cm  1.3 cm LVOT diam:     1.70 cm  2.0 cm LV SV:         62 ml    79 ml LV SV Index:   30.95    45 ml/m2 LVOT Area:     2.27 cm 3.14 cm2  LV Volumes (MOD)             Normals LV area d, A2C:    31.40 cm LV area d, A4C:    37.80 cm LV area s, A2C:    20.50 cm LV area s, A4C:    23.70 cm LV major d, A2C:   7.46 cm LV major d, A4C:   8.08 cm LV major s, A2C:   6.74 cm LV major s, A4C:   7.16 cm LV vol d, MOD A2C: 112.0 ml  68 ml LV vol d, MOD A4C:  146.0 ml LV vol s, MOD A2C: 53.5 ml   24 ml LV vol s, MOD A4C: 65.9 ml LV SV MOD A2C:     58.5 ml LV SV MOD A4C:     146.0 ml LV SV MOD BP:      71.6 ml   45 ml LEFT ATRIUM         Index LA diam:    4.60 cm 2.55 cm/m  AORTIC VALVE             Normals LVOT Vmax:   117.00 cm/s LVOT Vmean:  75.200 cm/s 75 cm/s LVOT VTI:    0.189 m     25.3 cm  AORTA                 Normals Ao Root diam: 2.20 cm 31 mm TRICUSPID VALVE             Normals TR Peak grad:   39.0 mmHg TR Vmax:        333.00 cm/s 288 cm/s  SHUNTS Systemic VTI:  0.19 m Systemic Diam: 1.70 cm  Dani Gobble Croitoru MD Electronically signed by Sanda Klein MD Signature Date/Time: 07/27/2019/11:41:34 AMThe mitral valve is normal in structure.    Final     Cardiac Studies   Echo:  1. Left ventricular ejection fraction, by visual estimation, is 55 to 60%. The left ventricle has normal function. There is mildly increased left ventricular hypertrophy. 2. Left ventricular diastolic function could not be evaluated. 3. The left ventricle has no regional wall motion abnormalities. 4. Global right ventricle has normal systolic function.The right ventricular size is normal. No increase in right ventricular wall thickness. 5. Left atrial size was moderately dilated. 6. Right atrial size was mildly dilated. 7. Mild mitral annular calcification. 8. The mitral valve is degenerative. Trivial mitral valve regurgitation. 9. The tricuspid valve is grossly normal. Tricuspid valve regurgitation moderate. 10. The tricuspid valve was normal in structure. Tricuspid valve regurgitation moderate. 11. The aortic valve is grossly normal. Aortic valve regurgitation is not visualized. 12. Moderately elevated pulmonary artery systolic pressure. 13. The tricuspid regurgitant velocity is 3.12 m/s, and with an assumed right atrial pressure of 15 mmHg, the estimated right ventricular systolic pressure is moderately elevated at 54.0 mmHg. 14. The inferior vena cava is dilated in  size with <50% respiratory variability, suggesting right atrial pressure of 15 mmHg.  Patient Profile     73 y.o. female with past medical history of chronic diastolic CHF, CAD (nonobstructive CAD by cath in 2000), HTN, HLD, Stage 4 CKD , COPD, and OSA (on CPAP) presenting to the hospital after recent coffee-ground emesis with EGD showing mild erosive esophagitis and Schatzki ring requiring dilatation. She subsequently developed atrial fibrillation with RVR requiring further management.  Assessment & Plan    ATRIAL FIB WITH RVR:    Now back in NSR.   Hypotension resolved.    Repeat echo as above.  LV OK.  Moderately elevated pulmonary pressures.  Moderate MR.    Off of pressors.  I would suggest continue IV amiodarone then will change to PO for a short course.  She is not able to have systemic anticoagulation although she has DVT subcutaneous heparin and heparin in her dialysis.    She tolerated a dose of low dose metoprolol last evening.  Continue this.    CHRONIC DIASTOLIC HF:  Volume managed per dialysis.   Pulling 50 ml per hour.   AKI:  Creat continues to improve.     For questions or updates, please contact [place] Please consult [email or web address] for contact info under Cardiology/STEMI.   Signed, Minus Breeding, MD  07/28/2019, 7:42 AM

## 2019-07-28 NOTE — Progress Notes (Addendum)
NAME:  [name], MRN:  [id number], DOB:  06/16/1946, LOS: 58 ADMISSION DATE:  07/17/2019, CONSULTATION DATE:  07/25/2019 REFERRING MD:  Forestine Na, CHIEF COMPLAINT:  AKI   Brief History    73 yo F with a history of diastolic heart failure, CKD stage IIIb, COPD, chronic respiratory failure, obesity, OSA, Afib (on chronic anticoagulation), GERD, hiatal hernia, gout, DM2, and chronic anemia who presented to [facility] on 12/12 after an episode of coffee ground emesis, nausea and vomiting.  She had an EGD done that showed esophagitis but no active bleeding ulcers.  She was also started on ciprofloaxinc for a UTI.  Unfortunately, throughout her hospital stay, he developed afib with RVR, started on amiodarone, and has had progressive renal failure, and has been anuric the last 24 hours.  She was transferred from [facility] to [facility] for consideration for CRRT.  At the bedside, patient is mentating appropriately and is in no distress.  Denies any significant shortness of breath.    Past Medical History   has a past medical history of Blood transfusion without reported diagnosis, CAD (coronary artery disease), Cataract, Cervical cancer ([place]) (1978), CHF (congestive heart failure) ([place]), Chronic back pain, Chronic kidney disease, COPD (chronic obstructive pulmonary disease) ([place]), Degenerative disc disease, Essential hypertension, benign, GERD (gastroesophageal reflux disease), Gout, Hiatal hernia (07/27/2013), History of diverticulitis of colon, History of hiatal hernia, Iron deficiency anemia, Irritable bowel syndrome, Lumbar radiculopathy, Mixed hyperlipidemia, Moderate major depression, single episode ([place]) (07/21/2019), Neuropathy, Osteoporosis, Ovarian cancer ([place]) (1978), Oxygen deficiency, Sleep apnea, Type 2 diabetes mellitus ([place]), Vitamin B deficiency (12/25/2009), and Vitamin B12 deficiency.   has a past surgical history that includes Esophagogastroduodenoscopy (11/19/2006); Colonoscopy  (10/01/2006); Cholecystectomy; Partial hysterectomy (1978); Tonsillectomy and adenoidectomy; Umbilical hernia repair (2010); Benign breast cysts; Two back surgeries/fusion; Abdominal hysterectomy; Back surgery; Esophagogastroduodenoscopy (10/01/2006); Esophagogastroduodenoscopy (2011); Colonoscopy (2011); small bowel capsule (2008); Esophagogastroduodenoscopy (N/A, 08/06/2013); Givens capsule study (N/A, 08/06/2013); Knee surgery (Right); Colonoscopy (N/A, 01/26/2016); Spine surgery; and Esophagogastroduodenoscopy (egd) with propofol (N/A, 07/19/2019).   Significant Hospital Events   12/14 EGD: esophagitis, Schatzki ring dilation 12/15 Afib with RVR: started on amiodarone 12/16: AKI on CKD, renal consulted initially 12/22: started on neo gtt early this am 07/28/2019-on neo as of 0400 this am 2/2 hypotension and inability to pull volume with crrt. Unsure if needed to pull volume with pt on baseline oxygen, without pulm edema or peripheral edema. See wt's are up but unsure if accurate. Ordered echo to determine LV/RV function and help guide management.   Consults:  Cardiology Nephrology GI  Procedures:  EGD 2/14  Significant Diagnostic Tests:  12/12 CT A/P without contrast: stable large hiatal hernia with majority of the stomach in the Chest. Increased bibasilar atelectasis. Right middle lobe 5 mm pulmonary nodules remain indeterminate.  Diverticulosis without acute inflammatory process. Improved body anasarca  Micro Data:  COVID-19 12/16--> negative Urine culture 12/12--> enterobacter aerogenes.   Antimicrobials:  Ciprofloxacin 12/17->  Interim history/subjective:   Continues on CRRT without issue. Denies dyspnea or abdominal pain. Eating 50% of meals.  Objective   Blood pressure (!) 117/45, pulse 66, temperature 97.7 F (36.5 C), temperature source Oral, resp. rate 16, height 4\' 8"  (1.422 m), weight 90.4 kg, SpO2 99 %.        Intake/Output Summary (Last 24 hours) at 07/28/2019  0943 Last data filed at 07/28/2019 0900 Gross per 24 hour  Intake 2583.06 ml  Output 2906 ml  Net -322.94 ml   [employer]  07/26/19 0600 07/27/19 0500 07/28/19 0432  Weight: 87 kg 94 kg 90.4 kg    Examination:  General Appearance: Morbidly obese.  Not intubated not sedated.  Lying in bed. Head:  Normocephalic, without obvious abnormality, atraumatic Eyes:  PERRL - yes, conjunctiva/corneas -clear Lungs: Clear to auscultation bilaterally,   Heart:  S1 and S2 normal, no murmur,  Abdomen:  Soft, no masses, no organomegaly Genitalia / Rectal:  Not done Extremities:  No c/c trace BL E edema and no upper extremity edema Skin:  intact in exposed areas .  HD catheter site intact. Neurologic:  arousable and follows commands. conversant   Resolved Hospital Problem list   UGIB due to esophagitis Shock.  Assessment & Plan:  73 yo F who presented with UGIB due to esophagitis in the setting of GERD and meloxicam use, hospital course complicated with Afib with RVR, and now with worsening AKI on CKD, transferred from [facility] to [facility] for consideration for CRRT.    Chronic hypoxic respiratory insufficiency due to HF and obesity. Persistent atrial fibrillation with ventricular rate now controlled with amiodarone IV AKI on CKD4 now on CRRT Chronic diastolic CHF COPD Gout OSA UTI, enterobacter aerogenes  General improvement as shock is now resolved and patient mentating clearly. On CRRT for volume overload. Suspect will need long-term access as doubt she will recover function.  Discussed with Dr. Justin Mend: Will order PermCath placement  Daily Goals Checklist  Pain/Anxiety/Delirium protocol (if indicated): Delirium monitoring only. VAP protocol (if indicated): not intubated. Respiratory support goals: Incentive spirometry. Blood pressure target: MAP>65. Amiodarone to oral tomorrow. DVT prophylaxis: Systemic heparin for HD circuit Nutrition Status: moderate malnutrition due to  critical illness. GI prophylaxis: BID Pantoprazole following UGIB Fluid status goals: Positive fluid balance. Increase fluid removal via CRRT to -153ml/h. Urinary catheter: Assessment of intravascular volume Central lines: post in place, temporary right femoral HD line. Glucose control: euglycemic Mobility/therapy needs: Ambulate with assistance Antibiotic de-escalation: Complete course of Cipro for UTI Home medication reconciliation: Relevant medications reordered. Daily labs: Daily CBC and renal panel Code Status: Full code  Family Communication: Will update today Disposition: ICU   LABS    PULMONARY No results for input(s): PHART, PCO2ART, PO2ART, HCO3, TCO2, O2SAT in the last 168 hours.  Invalid input(s): PCO2, PO2  CBC Recent Labs  Lab 07/24/19 0425 07/26/19 0352 07/28/19 0420  HGB 8.7* 9.6* 8.0*  HCT 28.5* 29.7* 25.8*  WBC 7.8 9.2 8.3  PLT 226 217 177    COAGULATION No results for input(s): INR in the last 168 hours.  CARDIAC  No results for input(s): TROPONINI in the last 168 hours. No results for input(s): PROBNP in the last 168 hours.   CHEMISTRY Recent Labs  Lab 07/22/19 0350 07/25/19 0257 07/26/19 0352 07/26/19 1841 07/27/19 0445 07/27/19 1600 07/28/19 0420  NA 139 134* 133* 134* 134* 135 135  K 4.1 4.9 4.6 4.5 4.4 4.2 4.4  CL 93* 93* 95* 100 98 102 101  CO2 33* 25 25 28 27 25 26   GLUCOSE 113* 123* 126* 171* 104* 133* 109*  BUN 98* 103* 64* 37* 24* 17 11  CREATININE 5.55* 7.98* 5.54* 3.71* 2.86* 2.21* 1.99*  CALCIUM 8.3* 7.5* 7.5* 7.4* 7.7* 7.6* 7.8*  MG 1.6* 1.4* 1.9  --  2.0  --  2.1  PHOS  --  7.3* 4.8* 3.1 2.7 1.9* 1.7*  1.6*   Estimated Creatinine Clearance: 22.7 mL/min (A) (by C-G formula based on SCr of 1.99 mg/dL (H)).  LIVER Recent Labs  Lab 07/26/19 0352 07/26/19 1841 07/27/19 0445 07/27/19 1600 07/28/19 0420  ALBUMIN 1.7* 1.7* 1.6* 1.8* 1.7*     INFECTIOUS No results for input(s): LATICACIDVEN, PROCALCITON in the  last 168 hours.   ENDOCRINE CBG (last 3)  Recent Labs    07/27/19 1557 07/27/19 2125 07/28/19 0752  GLUCAP 131* 130* 119*         IMAGING x48h  - image(s) personally visualized  -   highlighted in bold ECHOCARDIOGRAM LIMITED  Result Date: 07/27/2019   ECHOCARDIOGRAM LIMITED REPORT   Patient Name:   IDABELL PICKING Date of Exam: 07/27/2019 Medical Rec #:  [id number]          Height:       56.0 in Accession #:    [id number]         Weight:       207.2 lb Date of Birth:  [birth year]          BSA:          1.80 m Patient Age:    81 years           BP:           77/54 mmHg Patient Gender: F                  HR:           108 bpm. Exam Location:  Inpatient  Procedure: Limited Echo, Limited Color Doppler and Cardiac Doppler Indications:    Cardiomyopathy-Unspecified 425.9 / I42.9  History:        Patient has prior history of Echocardiogram examinations, most                 recent 06/16/2019. Chronic kidney disease on dialysis.  Sonographer:    Darlina Sicilian RDCS Referring Phys: [id number] [place]  1. Left ventricular ejection fraction, by visual estimation, is 55 to 60%. The left ventricle has normal function. There is mildly increased left ventricular hypertrophy.  2. Left ventricular diastolic function could not be evaluated.  3. The left ventricle has no regional wall motion abnormalities.  4. Global right ventricle has normal systolic function.The right ventricular size is normal. No increase in right ventricular wall thickness.  5. Left atrial size was moderately dilated.  6. Right atrial size was mildly dilated.  7. Mild mitral annular calcification.  8. The mitral valve is degenerative. Trivial mitral valve regurgitation.  9. The tricuspid valve is grossly normal. Tricuspid valve regurgitation moderate. 10. The tricuspid valve was normal in structure. Tricuspid valve regurgitation moderate. 11. The aortic valve is grossly normal. Aortic valve regurgitation is not  visualized. 12. Moderately elevated pulmonary artery systolic pressure. 13. The tricuspid regurgitant velocity is 3.12 m/s, and with an assumed right atrial pressure of 15 mmHg, the estimated right ventricular systolic pressure is moderately elevated at 54.0 mmHg. 14. The inferior vena cava is dilated in size with <50% respiratory variability, suggesting right atrial pressure of 15 mmHg. FINDINGS  Left Ventricle: Left ventricular ejection fraction, by visual estimation, is 55 to 60%. The left ventricle has normal function. The left ventricle has no regional wall motion abnormalities. The left ventricular internal cavity size was the left ventricle is normal in size. There is mildly increased left ventricular wall thickness. Concentric left ventricular hypertrophy. The left ventricular diastology could not be evaluated due to atrial fibrillation. Left ventricular diastolic function could not be evaluated. Right Ventricle: The right ventricular size is normal. No  increase in right ventricular wall thickness. Global RV systolic function is has normal systolic function. The tricuspid regurgitant velocity is 3.12 m/s, and with an assumed right atrial pressure  of 15 mmHg, the estimated right ventricular systolic pressure is moderately elevated at 54.0 mmHg. Left Atrium: Left atrial size was moderately dilated. Right Atrium: Right atrial size was mildly dilated. Right atrial pressure is estimated at 15 mmHg. Pericardium: There is no evidence of pericardial effusion is seen. There is no evidence of pericardial effusion. Mitral Valve: The mitral valve is normal in structure. There is mild thickening of the mitral valve leaflet(s). There is mild calcification of the mitral valve leaflet(s). Mild mitral annular calcification. Mild to moderate mitral valve regurgitation, with centrally-directed jet. Tricuspid Valve: The tricuspid valve is normal in structure. Tricuspid valve regurgitation moderate. Venous: The inferior vena  cava is dilated in size with less than 50% respiratory variability, suggesting right atrial pressure of 15 mmHg.  LEFT VENTRICLE          Normals PLAX 2D LVIDd:         4.47 cm  3.6 cm LVIDs:         2.80 cm  1.7 cm LV PW:         1.23 cm  1.4 cm LV IVS:        1.18 cm  1.3 cm LVOT diam:     1.70 cm  2.0 cm LV SV:         62 ml    79 ml LV SV Index:   30.95    45 ml/m2 LVOT Area:     2.27 cm 3.14 cm2  LV Volumes (MOD)             Normals LV area d, A2C:    31.40 cm LV area d, A4C:    37.80 cm LV area s, A2C:    20.50 cm LV area s, A4C:    23.70 cm LV major d, A2C:   7.46 cm LV major d, A4C:   8.08 cm LV major s, A2C:   6.74 cm LV major s, A4C:   7.16 cm LV vol d, MOD A2C: 112.0 ml  68 ml LV vol d, MOD A4C: 146.0 ml LV vol s, MOD A2C: 53.5 ml   24 ml LV vol s, MOD A4C: 65.9 ml LV SV MOD A2C:     58.5 ml LV SV MOD A4C:     146.0 ml LV SV MOD BP:      71.6 ml   45 ml LEFT ATRIUM         Index LA diam:    4.60 cm 2.55 cm/m  AORTIC VALVE             Normals LVOT Vmax:   117.00 cm/s LVOT Vmean:  75.200 cm/s 75 cm/s LVOT VTI:    0.189 m     25.3 cm  AORTA                 Normals Ao Root diam: 2.20 cm 31 mm TRICUSPID VALVE             Normals TR Peak grad:   39.0 mmHg TR Vmax:        333.00 cm/s 288 cm/s  SHUNTS Systemic VTI:  0.19 m Systemic Diam: 1.70 cm  Dani Gobble Croitoru MD Electronically signed by Sanda Klein MD Signature Date/Time: 07/27/2019/11:41:34 AMThe mitral valve is normal in structure.    Final    Einar Grad  Lynetta Mare, [place] ICU Physician [place]  Pager: [phone] Mobile: [phone] After hours: [phone].  07/28/2019, 11:07 AM      07/28/2019, 9:43 AM

## 2019-07-28 NOTE — Progress Notes (Signed)
Patient placed herself on home Trilogy for the night.

## 2019-07-28 NOTE — Progress Notes (Signed)
[facility] ROUNDING NOTE   Subjective:   This is a 73 year old lady with a history of diastolic heart failure, COPD, chronic kidney disease stage III/IV baseline serum creatinine appears to be at 3 mg/dL., obesity, obstructive sleep apnea, atrial fibrillation on chronic anticoagulation treatment, gastroesophageal reflux disease, hiatal hernia, gout, diabetes mellitus type 2.  She presented to [facility] 07/17/2019 with coffee-ground emesis EGD showed esophagitis but no active bleeding ulcers.  She also was found to have Enterobacter urinary tract infection and started on ciprofloxacin.  During her hospital stay she developed atrial fibrillation rapid ventricular rate and required amiodarone.    She was transferred to [facility] 07/25/2019 with acute on chronic kidney disease thought to be secondary to hypotension due to the atrial fibrillation and for the initiation of CRRT.. .  She will underwent insertion of right femoral triple-lumen catheter by critical care medicine 07/25/2019.  Appreciate assistance.  Urinalysis showed 100 mg/dL protein 6-10 RBCs and greater than 50 WBCs.  Culture positive for Enterobacter.  Blood pressure 104.59 pulse 67 temperature 97.7 O2 sats 99% room air  IV amiodarone IV ciprofloxacin IV phenylephrine  Sodium 135 potassium 4.4 chloride 101 CO2 26 BUN 11 creatinine 1.99 glucose 109 phosphorus 1.7 magnesium 2.1 albumin 1.7 calcium 7.8   Results of abdominal CT 07/17/2019  stable large hiatal hernia with majority of the stomach in the chest.Increased bibasilar atelectasisRight middle lobe 5 mm pulmonary nodules remain indeterminate.    Objective:  Vital signs in last 24 hours:  Temp:  [97.5 F (36.4 C)-97.8 F (36.6 C)] 97.7 F (36.5 C) (12/23 0757) Pulse Rate:  [59-126] 67 (12/23 1000) Resp:  [13-34] 19 (12/23 1000) BP: (46-137)/(31-116) 104/59 (12/23 1000) SpO2:  [82 %-100 %] 99 % (12/23 1000) Weight:  [90.4 kg] 90.4  kg (12/23 0432)  Weight change: -3.6 kg Filed Weights   07/26/19 0600 07/27/19 0500 07/28/19 0432  Weight: 87 kg 94 kg 90.4 kg    Intake/Output: I/O last 3 completed shifts: In: 3197.8 [P.O.:60; I.V.:2425.2; Other:100; IV Piggyback:612.6] Out: 2808 [Urine:35; Other:2773]   Intake/Output this shift:  Total I/O In: 441.2 [P.O.:210; I.V.:71.9; IV Piggyback:159.3] Out: 554 [Urine:15; Other:539]  Gen: chronically ill appearing, obese WF CVS: tachycardic Resp: cta Abd: +BS,soft, NT/ND Ext: no edema   Basic Metabolic Panel: Recent Labs  Lab 07/22/19 0350 07/25/19 0257 07/26/19 0352 07/26/19 1841 07/27/19 0445 07/27/19 1600 07/28/19 0420  NA 139 134* 133* 134* 134* 135 135  K 4.1 4.9 4.6 4.5 4.4 4.2 4.4  CL 93* 93* 95* 100 98 102 101  CO2 33* 25 25 28 27 25 26   GLUCOSE 113* 123* 126* 171* 104* 133* 109*  BUN 98* 103* 64* 37* 24* 17 11  CREATININE 5.55* 7.98* 5.54* 3.71* 2.86* 2.21* 1.99*  CALCIUM 8.3* 7.5* 7.5* 7.4* 7.7* 7.6* 7.8*  MG 1.6* 1.4* 1.9  --  2.0  --  2.1  PHOS  --  7.3* 4.8* 3.1 2.7 1.9* 1.7*  1.6*    Liver Function Tests: Recent Labs  Lab 07/26/19 0352 07/26/19 1841 07/27/19 0445 07/27/19 1600 07/28/19 0420  ALBUMIN 1.7* 1.7* 1.6* 1.8* 1.7*   No results for input(s): LIPASE, AMYLASE in the last 168 hours. No results for input(s): AMMONIA in the last 168 hours.  CBC: Recent Labs  Lab 07/22/19 0350 07/23/19 0351 07/23/19 1843 07/24/19 0425 07/26/19 0352 07/28/19 0420  WBC 7.0 6.7  --  7.8 9.2 8.3  HGB 7.3* 6.9* 9.4* 8.7* 9.6* 8.0*  HCT  24.0* 22.6* 30.4* 28.5* 29.7* 25.8*  MCV 91.6 91.5  --  88.8 87.4 87.5  PLT 286 244  --  226 217 177    Cardiac Enzymes: No results for input(s): CKTOTAL, CKMB, CKMBINDEX, TROPONINI in the last 168 hours.  BNP: Invalid input(s): POCBNP  CBG: Recent Labs  Lab 07/27/19 0758 07/27/19 1240 07/27/19 1557 07/27/19 2125 07/28/19 0752  GLUCAP 89 139* 131* 130* 119*    Microbiology: Results for  orders placed or performed during the hospital encounter of 07/17/19  Urine culture     Status: Abnormal   Collection Time: 07/17/19  9:40 AM   Specimen: Urine, Clean Catch  Result Value Ref Range Status   Specimen Description   Final    URINE, CLEAN CATCH Performed at [facility], [address], [place], [place] [address]    Special Requests   Final    NONE Performed at [facility], [address], [place], [place] [address]    Culture >=100,000 COLONIES/mL ENTEROBACTER AEROGENES (A)  Final   Report Status 07/20/2019 FINAL  Final   Organism ID, Bacteria ENTEROBACTER AEROGENES (A)  Final      Susceptibility   Enterobacter aerogenes - MIC*    CEFAZOLIN >=64 RESISTANT Resistant     CEFTRIAXONE <=1 SENSITIVE Sensitive     CIPROFLOXACIN <=0.25 SENSITIVE Sensitive     GENTAMICIN <=1 SENSITIVE Sensitive     IMIPENEM 1 SENSITIVE Sensitive     NITROFURANTOIN 64 INTERMEDIATE Intermediate     TRIMETH/SULFA <=20 SENSITIVE Sensitive     PIP/TAZO <=4 SENSITIVE Sensitive     * >=100,000 COLONIES/mL ENTEROBACTER AEROGENES  SARS CORONAVIRUS 2 (TAT 6-24 HRS) Nasopharyngeal Nasopharyngeal Swab     Status: None   Collection Time: 07/17/19  2:31 PM   Specimen: Nasopharyngeal Swab  Result Value Ref Range Status   SARS Coronavirus 2 NEGATIVE NEGATIVE Final    Comment: (NOTE) SARS-CoV-2 target nucleic acids are NOT DETECTED. The SARS-CoV-2 RNA is generally detectable in upper and lower respiratory specimens during the acute phase of infection. Negative results do not preclude SARS-CoV-2 infection, do not rule out co-infections with other pathogens, and should not be used as the sole basis for treatment or other patient management decisions. Negative results must be combined with clinical observations, patient history, and epidemiological information. The expected result is Negative. Fact Sheet for Patients: SugarRoll.be Fact Sheet for Healthcare  Providers: https://www.woods-mathews.com/ This test is not yet approved or cleared by the [place] FDA and  has been authorized for detection and/or diagnosis of SARS-CoV-2 by FDA under an Emergency Use Authorization (EUA). This EUA will remain  in effect (meaning this test can be used) for the duration of the COVID-19 declaration under Section 56 4(b)(1) of the Act, 21 U.S.C. section 360bbb-3(b)(1), unless the authorization is terminated or revoked sooner. Performed at [facility], [place] [address] [place], [place] [address]   MRSA PCR Screening     Status: None   Collection Time: 07/17/19 10:00 PM   Specimen: Nasal Mucosa; Nasopharyngeal  Result Value Ref Range Status   MRSA by PCR NEGATIVE NEGATIVE Final    Comment:        The GeneXpert MRSA Assay (FDA approved for NASAL specimens only), is one component of a comprehensive MRSA colonization surveillance program. It is not intended to diagnose MRSA infection nor to guide or monitor treatment for MRSA infections. Performed at [facility], [address], [place], [place] [address]   Novel Coronavirus, NAA (hospital order;  send-out to ref lab)     Status: None   Collection Time: 07/21/19 11:00 AM   Specimen: Nasopharyngeal Swab; Respiratory  Result Value Ref Range Status   SARS-CoV-2, NAA NOT DETECTED NOT DETECTED Final    Comment: (NOTE) This nucleic acid amplification test was developed and its performance characteristics determined by [employer]. Nucleic acid amplification tests include PCR and TMA. This test has not been FDA cleared or approved. This test has been authorized by FDA under an Emergency Use Authorization (EUA). This test is only authorized for the duration of time the declaration that circumstances exist justifying the authorization of the emergency use of in vitro diagnostic tests for detection of SARS-CoV-2 virus and/or diagnosis of COVID-19 infection under section  564(b)(1) of the Act, 21 U.S.C. 427CWC-3(J) (1), unless the authorization is terminated or revoked sooner. When diagnostic testing is negative, the possibility of a false negative result should be considered in the context of a patient's recent exposures and the presence of clinical signs and symptoms consistent with COVID-19. An individual without symptoms of COVID- 19 and who is not shedding SARS-CoV-2 vi rus would expect to have a negative (not detected) result in this assay. Performed At: [facility] [address] [place], [place] [id number] Katina Degree MDPhD HY:[id number]    Coronavirus Source NASOPHARYNGEAL  Final    Comment: Performed at [facility], [address], [place], [place] [address]    Coagulation Studies: No results for input(s): LABPROT, INR in the last 72 hours.  Urinalysis: No results for input(s): COLORURINE, LABSPEC, PHURINE, GLUCOSEU, HGBUR, BILIRUBINUR, KETONESUR, PROTEINUR, UROBILINOGEN, NITRITE, LEUKOCYTESUR in the last 72 hours.  Invalid input(s): APPERANCEUR    Imaging: ECHOCARDIOGRAM LIMITED  Result Date: 07/27/2019   ECHOCARDIOGRAM LIMITED REPORT   Patient Name:   [name] Date of Exam: 07/27/2019 Medical Rec #:  [id number]          Height:       56.0 in Accession #:    [id number]         Weight:       207.2 lb Date of Birth:  March 18, 1946          BSA:          1.80 m Patient Age:    68 years           BP:           77/54 mmHg Patient Gender: F                  HR:           108 bpm. Exam Location:  Inpatient  Procedure: Limited Echo, Limited Color Doppler and Cardiac Doppler Indications:    Cardiomyopathy-Unspecified 425.9 / I42.9  History:        Patient has prior history of Echocardiogram examinations, most                 recent 06/16/2019. Chronic kidney disease on dialysis.  Sonographer:    Darlina Sicilian RDCS Referring Phys: [id number] [place]  1. Left ventricular ejection fraction, by visual estimation, is 55 to 60%.  The left ventricle has normal function. There is mildly increased left ventricular hypertrophy.  2. Left ventricular diastolic function could not be evaluated.  3. The left ventricle has no regional wall motion abnormalities.  4. Global right ventricle has normal systolic function.The right ventricular size is normal. No increase in right ventricular wall thickness.  5. Left atrial size was  moderately dilated.  6. Right atrial size was mildly dilated.  7. Mild mitral annular calcification.  8. The mitral valve is degenerative. Trivial mitral valve regurgitation.  9. The tricuspid valve is grossly normal. Tricuspid valve regurgitation moderate. 10. The tricuspid valve was normal in structure. Tricuspid valve regurgitation moderate. 11. The aortic valve is grossly normal. Aortic valve regurgitation is not visualized. 12. Moderately elevated pulmonary artery systolic pressure. 13. The tricuspid regurgitant velocity is 3.12 m/s, and with an assumed right atrial pressure of 15 mmHg, the estimated right ventricular systolic pressure is moderately elevated at 54.0 mmHg. 14. The inferior vena cava is dilated in size with <50% respiratory variability, suggesting right atrial pressure of 15 mmHg. FINDINGS  Left Ventricle: Left ventricular ejection fraction, by visual estimation, is 55 to 60%. The left ventricle has normal function. The left ventricle has no regional wall motion abnormalities. The left ventricular internal cavity size was the left ventricle is normal in size. There is mildly increased left ventricular wall thickness. Concentric left ventricular hypertrophy. The left ventricular diastology could not be evaluated due to atrial fibrillation. Left ventricular diastolic function could not be evaluated. Right Ventricle: The right ventricular size is normal. No increase in right ventricular wall thickness. Global RV systolic function is has normal systolic function. The tricuspid regurgitant velocity is 3.12 m/s, and  with an assumed right atrial pressure  of 15 mmHg, the estimated right ventricular systolic pressure is moderately elevated at 54.0 mmHg. Left Atrium: Left atrial size was moderately dilated. Right Atrium: Right atrial size was mildly dilated. Right atrial pressure is estimated at 15 mmHg. Pericardium: There is no evidence of pericardial effusion is seen. There is no evidence of pericardial effusion. Mitral Valve: The mitral valve is normal in structure. There is mild thickening of the mitral valve leaflet(s). There is mild calcification of the mitral valve leaflet(s). Mild mitral annular calcification. Mild to moderate mitral valve regurgitation, with centrally-directed jet. Tricuspid Valve: The tricuspid valve is normal in structure. Tricuspid valve regurgitation moderate. Venous: The inferior vena cava is dilated in size with less than 50% respiratory variability, suggesting right atrial pressure of 15 mmHg.  LEFT VENTRICLE          Normals PLAX 2D LVIDd:         4.47 cm  3.6 cm LVIDs:         2.80 cm  1.7 cm LV PW:         1.23 cm  1.4 cm LV IVS:        1.18 cm  1.3 cm LVOT diam:     1.70 cm  2.0 cm LV SV:         62 ml    79 ml LV SV Index:   30.95    45 ml/m2 LVOT Area:     2.27 cm 3.14 cm2  LV Volumes (MOD)             Normals LV area d, A2C:    31.40 cm LV area d, A4C:    37.80 cm LV area s, A2C:    20.50 cm LV area s, A4C:    23.70 cm LV major d, A2C:   7.46 cm LV major d, A4C:   8.08 cm LV major s, A2C:   6.74 cm LV major s, A4C:   7.16 cm LV vol d, MOD A2C: 112.0 ml  68 ml LV vol d, MOD A4C: 146.0 ml LV vol s, MOD A2C: 53.5 ml  24 ml LV vol s, MOD A4C: 65.9 ml LV SV MOD A2C:     58.5 ml LV SV MOD A4C:     146.0 ml LV SV MOD BP:      71.6 ml   45 ml LEFT ATRIUM         Index LA diam:    4.60 cm 2.55 cm/m  AORTIC VALVE             Normals LVOT Vmax:   117.00 cm/s LVOT Vmean:  75.200 cm/s 75 cm/s LVOT VTI:    0.189 m     25.3 cm  AORTA                 Normals Ao Root diam: 2.20 cm 31 mm TRICUSPID  VALVE             Normals TR Peak grad:   39.0 mmHg TR Vmax:        333.00 cm/s 288 cm/s  SHUNTS Systemic VTI:  0.19 m Systemic Diam: 1.70 cm  Dani Gobble Croitoru MD Electronically signed by Sanda Klein MD Signature Date/Time: 07/27/2019/11:41:34 AMThe mitral valve is normal in structure.    Final      Medications:   .  prismasol BGK 4/2.5 500 mL/hr at 07/28/19 0809  .  prismasol BGK 4/2.5 300 mL/hr at 07/28/19 0406  . sodium chloride Stopped (07/28/19 0912)  . amiodarone 30 mg/hr (07/28/19 1000)  . ciprofloxacin 200 mL/hr at 07/28/19 1000  . heparin 10,000 units/ 20 mL infusion syringe 1,650 Units/hr (07/28/19 0956)  . phenylephrine (NEO-SYNEPHRINE) Adult infusion Stopped (07/28/19 0343)  . prismasol BGK 4/2.5 1,000 mL/hr at 07/28/19 0910   . sodium chloride   Intravenous Once  . allopurinol  100 mg Oral Daily  . atorvastatin  40 mg Oral QHS  . Chlorhexidine Gluconate Cloth  6 each Topical Daily  . darbepoetin (ARANESP) injection - NON-DIALYSIS  150 mcg Subcutaneous Q Thu-1800  . umeclidinium bromide  1 puff Inhalation Daily   And  . fluticasone furoate-vilanterol  1 puff Inhalation Daily  . heparin  5,000 Units Subcutaneous Q8H  . insulin aspart  0-9 Units Subcutaneous TID WC  . insulin glargine  5 Units Subcutaneous QHS  . metoprolol tartrate  25 mg Oral BID  . pantoprazole  40 mg Oral BID AC  . sertraline  50 mg Oral Daily  . sodium chloride flush  10-40 mL Intracatheter Q12H  . sodium chloride flush  3 mL Intravenous Q12H   sodium chloride, acetaminophen **OR** acetaminophen, albuterol, heparin, heparin, heparin, sodium chloride flush, sodium chloride flush  Assessment/ Plan:  1. AKI/CKD stage 4- in setting of atrial fibrillation, hypotension, h/o NSAIDs, and ABLA due to UGIB. She remains oliguric. Also with h/o bladder outlet obstruction. CRRT initiated 07/25/2019.  Seems to be doing better with heparin.  Have noticed a slight drop in hemoglobin from 07/26/2019 to  07/28/2019.  Continue to follow. 2. Hematemesis/UGIB- seen by GI and EGD with erosive reflux esophagitis. Transfused 2 units PRBC's on 07/23/19.    Restarted heparin on CRRT 07/27/2019.Marland Kitchen  Continues on Protonix 40 mg twice daily  3. Atrial fibrillation with RVR and ongoing hypotension- on amiodarone drip. Continues on metoprolol 25 mg twice daily.  Apixaban as outpatient.  Using heparin with caution due to history of GI bleed 4. UTI -Enterobacter  on cipro day 4/7 5. Diastolic congestive heart failure torsemide being held.  Home dose 50 mg daily 6. Depression Zoloft 50 mg daily 7. Gout  allopurinol 100 mg daily 8. Diabetes mellitus per primary team 9. Hyperlipidemia continue Lipitor.    LOS: [place] @TODAY @10 :12 AM

## 2019-07-29 ENCOUNTER — Inpatient Hospital Stay (HOSPITAL_COMMUNITY): Payer: Medicare Other

## 2019-07-29 DIAGNOSIS — J9611 Chronic respiratory failure with hypoxia: Secondary | ICD-10-CM

## 2019-07-29 HISTORY — PX: IR FLUORO GUIDE CV LINE LEFT: IMG2282

## 2019-07-29 HISTORY — PX: IR US GUIDE VASC ACCESS LEFT: IMG2389

## 2019-07-29 LAB — CBC
HCT: 23.8 % — ABNORMAL LOW (ref 36.0–46.0)
HCT: 27.1 % — ABNORMAL LOW (ref 36.0–46.0)
Hemoglobin: 7.4 g/dL — ABNORMAL LOW (ref 12.0–15.0)
Hemoglobin: 8.6 g/dL — ABNORMAL LOW (ref 12.0–15.0)
MCH: 27.3 pg (ref 26.0–34.0)
MCH: 28.2 pg (ref 26.0–34.0)
MCHC: 31.1 g/dL (ref 30.0–36.0)
MCHC: 31.7 g/dL (ref 30.0–36.0)
MCV: 87.8 fL (ref 80.0–100.0)
MCV: 88.9 fL (ref 80.0–100.0)
Platelets: 180 10*3/uL (ref 150–400)
Platelets: 178 10*3/uL (ref 150–400)
RBC: 2.71 MIL/uL — ABNORMAL LOW (ref 3.87–5.11)
RBC: 3.05 MIL/uL — ABNORMAL LOW (ref 3.87–5.11)
RDW: 18.4 % — ABNORMAL HIGH (ref 11.5–15.5)
RDW: 17.7 % — ABNORMAL HIGH (ref 11.5–15.5)
WBC: 8.9 10*3/uL (ref 4.0–10.5)
WBC: 9.5 10*3/uL (ref 4.0–10.5)
nRBC: 0.8 % — ABNORMAL HIGH (ref 0.0–0.2)
nRBC: 0.6 % — ABNORMAL HIGH (ref 0.0–0.2)

## 2019-07-29 LAB — RENAL FUNCTION PANEL
Albumin: 1.8 g/dL — ABNORMAL LOW (ref 3.5–5.0)
Albumin: 1.8 g/dL — ABNORMAL LOW (ref 3.5–5.0)
Anion gap: 5 (ref 5–15)
Anion gap: 5 (ref 5–15)
BUN: 8 mg/dL (ref 8–23)
BUN: 8 mg/dL (ref 8–23)
CO2: 28 mmol/L (ref 22–32)
CO2: 27 mmol/L (ref 22–32)
Calcium: 7.8 mg/dL — ABNORMAL LOW (ref 8.9–10.3)
Calcium: 7.7 mg/dL — ABNORMAL LOW (ref 8.9–10.3)
Chloride: 102 mmol/L (ref 98–111)
Chloride: 101 mmol/L (ref 98–111)
Creatinine, Ser: 1.74 mg/dL — ABNORMAL HIGH (ref 0.44–1.00)
Creatinine, Ser: 1.79 mg/dL — ABNORMAL HIGH (ref 0.44–1.00)
GFR calc Af Amer: 33 mL/min — ABNORMAL LOW (ref >60)
GFR calc Af Amer: 32 mL/min — ABNORMAL LOW (ref >60)
GFR calc non Af Amer: 28 mL/min — ABNORMAL LOW (ref >60)
GFR calc non Af Amer: 27 mL/min — ABNORMAL LOW (ref >60)
Glucose, Bld: 97 mg/dL (ref 70–99)
Glucose, Bld: 104 mg/dL — ABNORMAL HIGH (ref 70–99)
Phosphorus: 1.7 mg/dL — ABNORMAL LOW (ref 2.5–4.6)
Phosphorus: 2.1 mg/dL — ABNORMAL LOW (ref 2.5–4.6)
Potassium: 4.5 mmol/L (ref 3.5–5.1)
Potassium: 4.4 mmol/L (ref 3.5–5.1)
Sodium: 135 mmol/L (ref 135–145)
Sodium: 133 mmol/L — ABNORMAL LOW (ref 135–145)

## 2019-07-29 LAB — GLUCOSE, CAPILLARY
Glucose-Capillary: 88 mg/dL (ref 70–99)
Glucose-Capillary: 109 mg/dL — ABNORMAL HIGH (ref 70–99)
Glucose-Capillary: 92 mg/dL (ref 70–99)
Glucose-Capillary: 115 mg/dL — ABNORMAL HIGH (ref 70–99)

## 2019-07-29 LAB — APTT: aPTT: 196 seconds (ref 24–36)

## 2019-07-29 LAB — MAGNESIUM: Magnesium: 2.1 mg/dL (ref 1.7–2.4)

## 2019-07-29 LAB — POCT ACTIVATED CLOTTING TIME
Activated Clotting Time: 164 seconds
Activated Clotting Time: 169 seconds
Activated Clotting Time: 169 seconds
Activated Clotting Time: 175 seconds
Activated Clotting Time: 180 seconds
Activated Clotting Time: 186 seconds
Activated Clotting Time: 197 seconds
Activated Clotting Time: 197 seconds

## 2019-07-29 LAB — PREPARE RBC (CROSSMATCH)

## 2019-07-29 MED ORDER — MIDAZOLAM HCL 2 MG/2ML IJ SOLN
INTRAMUSCULAR | Status: AC
  Filled 2019-07-29: qty 2

## 2019-07-29 MED ORDER — LIDOCAINE HCL (PF) 1 % IJ SOLN
INTRAMUSCULAR | Status: AC | PRN
  Administered 2019-07-29: 5 mL

## 2019-07-29 MED ORDER — CEFAZOLIN SODIUM-DEXTROSE 2-4 GM/100ML-% IV SOLN: 2.0000 g | INTRAVENOUS | Status: AC

## 2019-07-29 MED ORDER — SODIUM CHLORIDE 0.9 % IV SOLN: INTRAVENOUS | Status: DC | PRN

## 2019-07-29 MED ORDER — FENTANYL CITRATE (PF) 100 MCG/2ML IJ SOLN
INTRAMUSCULAR | Status: AC | PRN
  Administered 2019-07-29: 25 ug via INTRAVENOUS

## 2019-07-29 MED ORDER — CIPROFLOXACIN IN D5W 400 MG/200ML IV SOLN
400.0000 mg | INTRAVENOUS | Status: AC
  Administered 2019-07-30: 400 mg via INTRAVENOUS
  Filled 2019-07-29: qty 200

## 2019-07-29 MED ORDER — DOCUSATE SODIUM 100 MG PO CAPS
100.0000 mg | ORAL_CAPSULE | Freq: Every day | ORAL | Status: DC
  Administered 2019-07-29 – 2019-08-11 (×11): 100 mg via ORAL
  Filled 2019-07-29 (×13): qty 1

## 2019-07-29 MED ORDER — LIDOCAINE HCL 1 % IJ SOLN
INTRAMUSCULAR | Status: AC
  Filled 2019-07-29: qty 20

## 2019-07-29 MED ORDER — HEPARIN SODIUM (PORCINE) 1000 UNIT/ML IJ SOLN
INTRAMUSCULAR | Status: AC
  Filled 2019-07-29: qty 1

## 2019-07-29 MED ORDER — SODIUM CHLORIDE 0.9% IV SOLUTION: Freq: Once | INTRAVENOUS | Status: DC

## 2019-07-29 MED ORDER — AMIODARONE HCL 200 MG PO TABS
200.0000 mg | ORAL_TABLET | Freq: Every day | ORAL | Status: DC
  Administered 2019-07-29 – 2019-08-11 (×13): 200 mg via ORAL
  Filled 2019-07-29 (×13): qty 1

## 2019-07-29 MED ORDER — FENTANYL CITRATE (PF) 100 MCG/2ML IJ SOLN
INTRAMUSCULAR | Status: AC
  Filled 2019-07-29: qty 2

## 2019-07-29 MED ORDER — SODIUM CHLORIDE 0.9% IV SOLUTION: Freq: Once | INTRAVENOUS | Status: AC

## 2019-07-29 MED ORDER — CEFAZOLIN SODIUM-DEXTROSE 2-4 GM/100ML-% IV SOLN
INTRAVENOUS | Status: AC
  Administered 2019-07-29: 2 g via INTRAVENOUS
  Filled 2019-07-29: qty 100

## 2019-07-29 MED ORDER — SODIUM PHOSPHATES 45 MMOLE/15ML IV SOLN
20.0000 mmol | Freq: Once | INTRAVENOUS | Status: AC
  Administered 2019-07-29: 20 mmol via INTRAVENOUS
  Filled 2019-07-29: qty 6.67

## 2019-07-29 MED ORDER — MIDAZOLAM HCL 2 MG/2ML IJ SOLN
INTRAMUSCULAR | Status: AC | PRN
  Administered 2019-07-29: 1 mg via INTRAVENOUS

## 2019-07-29 NOTE — Procedures (Signed)
  Procedure: L int jug tunneled HD cath 23 Palindrome EBL:   minimal Complications:  none immediate  See full dictation in [facility].  Dillard Cannon MD Main # [phone] Pager  [phone]

## 2019-07-29 NOTE — Progress Notes (Signed)
Progress Note  Patient Name: [name] Date of Encounter: 07/29/2019  Primary Cardiologist:   [name], MD   Subjective   No acute complaints this morning.  No pain.    Inpatient Medications    Scheduled Meds: . sodium chloride   Intravenous Once  . allopurinol  100 mg Oral Daily  . atorvastatin  40 mg Oral QHS  . B-complex with vitamin C  1 tablet Oral Daily  . Chlorhexidine Gluconate Cloth  6 each Topical Daily  . darbepoetin (ARANESP) injection - NON-DIALYSIS  150 mcg Subcutaneous Q Thu-1800  . docusate sodium  100 mg Oral Daily  . feeding supplement (ENSURE ENLIVE)  237 mL Oral BID WC  . feeding supplement (PRO-STAT SUGAR FREE 64)  30 mL Oral BID  . umeclidinium bromide  1 puff Inhalation Daily   And  . fluticasone furoate-vilanterol  1 puff Inhalation Daily  . insulin aspart  0-9 Units Subcutaneous TID WC  . insulin glargine  5 Units Subcutaneous QHS  . metoprolol tartrate  25 mg Oral BID  . pantoprazole  40 mg Oral BID AC  . sertraline  50 mg Oral Daily  . sodium chloride flush  10-40 mL Intracatheter Q12H  . sodium chloride flush  3 mL Intravenous Q12H   Continuous Infusions: .  prismasol BGK 4/2.5 500 mL/hr at 07/29/19 0808  .  prismasol BGK 4/2.5 300 mL/hr at 07/28/19 2205  . sodium chloride 10 mL/hr at 07/29/19 0800  . amiodarone 30 mg/hr (07/29/19 0800)  . ciprofloxacin Stopped (07/28/19 2201)  . heparin 10,000 units/ 20 mL infusion syringe 1,700 Units/hr (07/29/19 0749)  . phenylephrine (NEO-SYNEPHRINE) Adult infusion Stopped (07/28/19 0343)  . prismasol BGK 4/2.5 1,000 mL/hr at 07/29/19 0806   PRN Meds: sodium chloride, acetaminophen **OR** acetaminophen, albuterol, heparin, heparin, heparin, sodium chloride flush, sodium chloride flush   Vital Signs    Vitals:   07/29/19 0630 07/29/19 0700 07/29/19 0743 07/29/19 0800  BP: (!) 129/52 (!) 118/43  (!) 117/49  Pulse: 75 67  73  Resp: 17 19  (!) 26  Temp:   97.7 F (36.5 C)     TempSrc:   Oral   SpO2: 99% 98%  98%  Weight:      Height:        Intake/Output Summary (Last 24 hours) at 07/29/2019 0816 Last data filed at 07/29/2019 0800 Gross per 24 hour  Intake 1604.27 ml  Output 3502 ml  Net -1897.73 ml   Filed Weights   07/27/19 0500 07/28/19 0432 07/29/19 0302  Weight: 94 kg 90.4 kg 88.4 kg    Telemetry    NSR- Personally Reviewed  ECG    NA - Personally Reviewed  Physical Exam   GEN: No  acute distress.   Neck: No  JVD Cardiac: RRR, no murmurs, rubs, or gallops.  Respiratory: Clear   to auscultation bilaterally. GI: Soft, nontender, non-distended, normal bowel sounds  MS:  No edema; No deformity. Neuro:   Nonfocal  Psych: Oriented and appropriate    Labs    Chemistry Recent Labs  Lab 07/28/19 0420 07/28/19 1644 07/29/19 0300  NA 135 136 135  K 4.4 4.8 4.5  CL 101 102 102  CO2 26 28 28   GLUCOSE 109* 126* 97  BUN 11 9 8   CREATININE 1.99* 1.93* 1.74*  CALCIUM 7.8* 8.2* 7.8*  ALBUMIN 1.7* 1.9* 1.8*  GFRNONAA 24* 25* 28*  GFRAA 28* 29* 33*  ANIONGAP 8 6 5  Hematology Recent Labs  Lab 07/26/19 0352 07/28/19 0420 07/29/19 0300  WBC 9.2 8.3 8.9  RBC 3.40* 2.95* 2.71*  HGB 9.6* 8.0* 7.4*  HCT 29.7* 25.8* 23.8*  MCV 87.4 87.5 87.8  MCH 28.2 27.1 27.3  MCHC 32.3 31.0 31.1  RDW 17.0* 18.0* 18.4*  PLT 217 177 180    Cardiac EnzymesNo results for input(s): TROPONINI in the last 168 hours. No results for input(s): TROPIPOC in the last 168 hours.   BNPNo results for input(s): BNP, PROBNP in the last 168 hours.   DDimer No results for input(s): DDIMER in the last 168 hours.   Radiology    ECHOCARDIOGRAM LIMITED  Result Date: 07/27/2019   ECHOCARDIOGRAM LIMITED REPORT   Patient Name:   [name] Date of Exam: 07/27/2019 Medical Rec #:  [id number]          Height:       56.0 in Accession #:    [id number]         Weight:       207.2 lb Date of Birth:  September 12, 1945          BSA:          1.80 m Patient Age:     73 years           BP:           77/54 mmHg Patient Gender: F                  HR:           108 bpm. Exam Location:  Inpatient  Procedure: Limited Echo, Limited Color Doppler and Cardiac Doppler Indications:    Cardiomyopathy-Unspecified 425.9 / I42.9  History:        Patient has prior history of Echocardiogram examinations, most                 recent 06/16/2019. Chronic kidney disease on dialysis.  Sonographer:    Darlina Sicilian RDCS Referring Phys: [id number] [place]  1. Left ventricular ejection fraction, by visual estimation, is 55 to 60%. The left ventricle has normal function. There is mildly increased left ventricular hypertrophy.  2. Left ventricular diastolic function could not be evaluated.  3. The left ventricle has no regional wall motion abnormalities.  4. Global right ventricle has normal systolic function.The right ventricular size is normal. No increase in right ventricular wall thickness.  5. Left atrial size was moderately dilated.  6. Right atrial size was mildly dilated.  7. Mild mitral annular calcification.  8. The mitral valve is degenerative. Trivial mitral valve regurgitation.  9. The tricuspid valve is grossly normal. Tricuspid valve regurgitation moderate. 10. The tricuspid valve was normal in structure. Tricuspid valve regurgitation moderate. 11. The aortic valve is grossly normal. Aortic valve regurgitation is not visualized. 12. Moderately elevated pulmonary artery systolic pressure. 13. The tricuspid regurgitant velocity is 3.12 m/s, and with an assumed right atrial pressure of 15 mmHg, the estimated right ventricular systolic pressure is moderately elevated at 54.0 mmHg. 14. The inferior vena cava is dilated in size with <50% respiratory variability, suggesting right atrial pressure of 15 mmHg. FINDINGS  Left Ventricle: Left ventricular ejection fraction, by visual estimation, is 55 to 60%. The left ventricle has normal function. The left ventricle has no regional  wall motion abnormalities. The left ventricular internal cavity size was the left ventricle is normal in size. There is mildly increased left ventricular wall thickness. Concentric left ventricular hypertrophy.  The left ventricular diastology could not be evaluated due to atrial fibrillation. Left ventricular diastolic function could not be evaluated. Right Ventricle: The right ventricular size is normal. No increase in right ventricular wall thickness. Global RV systolic function is has normal systolic function. The tricuspid regurgitant velocity is 3.12 m/s, and with an assumed right atrial pressure  of 15 mmHg, the estimated right ventricular systolic pressure is moderately elevated at 54.0 mmHg. Left Atrium: Left atrial size was moderately dilated. Right Atrium: Right atrial size was mildly dilated. Right atrial pressure is estimated at 15 mmHg. Pericardium: There is no evidence of pericardial effusion is seen. There is no evidence of pericardial effusion. Mitral Valve: The mitral valve is normal in structure. There is mild thickening of the mitral valve leaflet(s). There is mild calcification of the mitral valve leaflet(s). Mild mitral annular calcification. Mild to moderate mitral valve regurgitation, with centrally-directed jet. Tricuspid Valve: The tricuspid valve is normal in structure. Tricuspid valve regurgitation moderate. Venous: The inferior vena cava is dilated in size with less than 50% respiratory variability, suggesting right atrial pressure of 15 mmHg.  LEFT VENTRICLE          Normals PLAX 2D LVIDd:         4.47 cm  3.6 cm LVIDs:         2.80 cm  1.7 cm LV PW:         1.23 cm  1.4 cm LV IVS:        1.18 cm  1.3 cm LVOT diam:     1.70 cm  2.0 cm LV SV:         62 ml    79 ml LV SV Index:   30.95    45 ml/m2 LVOT Area:     2.27 cm 3.14 cm2  LV Volumes (MOD)             Normals LV area d, A2C:    31.40 cm LV area d, A4C:    37.80 cm LV area s, A2C:    20.50 cm LV area s, A4C:    23.70 cm LV  major d, A2C:   7.46 cm LV major d, A4C:   8.08 cm LV major s, A2C:   6.74 cm LV major s, A4C:   7.16 cm LV vol d, MOD A2C: 112.0 ml  68 ml LV vol d, MOD A4C: 146.0 ml LV vol s, MOD A2C: 53.5 ml   24 ml LV vol s, MOD A4C: 65.9 ml LV SV MOD A2C:     58.5 ml LV SV MOD A4C:     146.0 ml LV SV MOD BP:      71.6 ml   45 ml LEFT ATRIUM         Index LA diam:    4.60 cm 2.55 cm/m  AORTIC VALVE             Normals LVOT Vmax:   117.00 cm/s LVOT Vmean:  75.200 cm/s 75 cm/s LVOT VTI:    0.189 m     25.3 cm  AORTA                 Normals Ao Root diam: 2.20 cm 31 mm TRICUSPID VALVE             Normals TR Peak grad:   39.0 mmHg TR Vmax:        333.00 cm/s 288 cm/s  SHUNTS Systemic VTI:  0.19 m Systemic Diam: 1.70  cm  Sanda Klein MD Electronically signed by Sanda Klein MD Signature Date/Time: 07/27/2019/11:41:34 AMThe mitral valve is normal in structure.    Final     Cardiac Studies   Echo:  1. Left ventricular ejection fraction, by visual estimation, is 55 to 60%. The left ventricle has normal function. There is mildly increased left ventricular hypertrophy. 2. Left ventricular diastolic function could not be evaluated. 3. The left ventricle has no regional wall motion abnormalities. 4. Global right ventricle has normal systolic function.The right ventricular size is normal. No increase in right ventricular wall thickness. 5. Left atrial size was moderately dilated. 6. Right atrial size was mildly dilated. 7. Mild mitral annular calcification. 8. The mitral valve is degenerative. Trivial mitral valve regurgitation. 9. The tricuspid valve is grossly normal. Tricuspid valve regurgitation moderate. 10. The tricuspid valve was normal in structure. Tricuspid valve regurgitation moderate. 11. The aortic valve is grossly normal. Aortic valve regurgitation is not visualized. 12. Moderately elevated pulmonary artery systolic pressure. 13. The tricuspid regurgitant velocity is 3.12 m/s, and with an assumed right  atrial pressure of 15 mmHg, the estimated right ventricular systolic pressure is moderately elevated at 54.0 mmHg. 14. The inferior vena cava is dilated in size with <50% respiratory variability, suggesting right atrial pressure of 15 mmHg.  Patient Profile     73 y.o. female with past medical history of chronic diastolic CHF, CAD (nonobstructive CAD by cath in 2000), HTN, HLD, Stage 4 CKD , COPD, and OSA (on CPAP) presenting to the hospital after recent coffee-ground emesis with EGD showing mild erosive esophagitis and Schatzki ring requiring dilatation. She subsequently developed atrial fibrillation with RVR requiring further management.  Assessment & Plan    ATRIAL FIB WITH RVR:    Now back in NSR.  Switched to PO amiodarone.  No anticoagulation.  Plan transfusion today.   CHRONIC DIASTOLIC HF:  Net negative 1.9 liters with CRRT Volume managed per dialysis.   Pulling 100 ml per hour now.    AKI:  Creat is improving although still oliguric.    For questions or updates, please contact [facility] Please consult [email or web address] for contact info under Cardiology/STEMI.   Signed, Minus Breeding, MD  07/29/2019, 8:16 AM

## 2019-07-29 NOTE — Evaluation (Signed)
Physical Therapy Evaluation Patient Details Name: CORNELL GABER MRN: [id number] DOB: 1946-04-11 Today's Date: 07/29/2019   History of Present Illness  73 yo F with a history of diastolic heart failure, CKD stage IIIb, COPD, chronic respiratory failure, obesity, OSA, Afib (on chronic anticoagulation), GERD, hiatal hernia, gout, DM2, and chronic anemia who presented to [facility] on 12/12 after an episode of coffee ground emesis, nausea and vomiting. She was also started on ciprofloaxinc for a UTI. developed afib with RVR, started on amiodarone, and has had progressive renal failure, and has been anuric the last 24 hours. Pt off CRRT on 12/24.  Clinical Impression  Pt presents to PT with deficits in gait, balance, strength, power, endurance. Pt demonstrates generalized weakness with reduced tolerance for ambulation and OOB activity at this time. Pt lives alone and will need to mobilize at a modI level if returning alone, or supervision if she is able to arrange enough family support. Pt will continue to benefit from acute PT POC to improve gait and ambulation quality and to increase activity tolerance.    Follow Up Recommendations SNF;Supervision/Assistance - 24 hour(may progress quickly toward baseline)    Equipment Recommendations  None recommended by PT(defer to post-acute setting)    Recommendations for Other Services       Precautions / Restrictions Precautions Precautions: Fall Restrictions Weight Bearing Restrictions: No      Mobility  Bed Mobility Overal bed mobility: Needs Assistance Bed Mobility: Supine to Sit     Supine to sit: Supervision        Transfers Overall transfer level: Needs assistance Equipment used: Rolling walker (2 wheeled) Transfers: Sit to/from Stand Sit to Stand: Min guard            Ambulation/Gait Ambulation/Gait assistance: Min guard Gait Distance (Feet): 5 Feet Assistive device: Rolling walker (2 wheeled) Gait  Pattern/deviations: Step-to pattern;Shuffle Gait velocity: decreased Gait velocity interpretation: <1.31 ft/sec, indicative of household ambulator General Gait Details: short step to gait with reduced foot clearance bilaterally  Stairs            Wheelchair Mobility    Modified Rankin (Stroke Patients Only)       Balance Overall balance assessment: Needs assistance Sitting-balance support: Bilateral upper extremity supported;Feet unsupported Sitting balance-Leahy Scale: Good Sitting balance - Comments: supervision   Standing balance support: Bilateral upper extremity supported Standing balance-Leahy Scale: Fair Standing balance comment: minG with BUe support of RW                             Pertinent Vitals/Pain Pain Assessment: No/denies pain    Home Living Family/patient expects to be discharged to:: Private residence   Available Help at Discharge: Family;Available PRN/intermittently Type of Home: House Home Access: Level entry     Home Layout: One level Home Equipment: Walker - 2 wheels;Shower seat;Bedside commode      Prior Function Level of Independence: Independent with assistive device(s)         Comments: limited community ambulator with use of RW     Hand Dominance   Dominant Hand: Right    Extremity/Trunk Assessment   Upper Extremity Assessment Upper Extremity Assessment: Generalized weakness    Lower Extremity Assessment Lower Extremity Assessment: Generalized weakness    Cervical / Trunk Assessment Cervical / Trunk Assessment: Normal  Communication   Communication: No difficulties  Cognition Arousal/Alertness: Awake/alert Behavior During Therapy: WFL for tasks assessed/performed Overall Cognitive Status: Within Functional Limits  for tasks assessed                                        General Comments General comments (skin integrity, edema, etc.): VSS, RA    Exercises     Assessment/Plan     PT Assessment Patient needs continued PT services  PT Problem List Decreased strength;Decreased activity tolerance;Decreased balance;Decreased mobility;Decreased knowledge of precautions       PT Treatment Interventions DME instruction;Gait training;Functional mobility training;Therapeutic activities;Therapeutic exercise;Balance training;Neuromuscular re-education;Patient/family education    PT Goals (Current goals can be found in the Care Plan section)  Acute Rehab PT Goals Patient Stated Goal: To return to baseline PT Goal Formulation: With patient Time For Goal Achievement: 08/12/19 Potential to Achieve Goals: Good Additional Goals Additional Goal #1: Pt will maintain dynamic standing balance within 10 inches of her base of support with unilateral UE support of the LRAD, modI    Frequency Min 3X/week   Barriers to discharge        Co-evaluation               AM-PAC PT "6 Clicks" Mobility  Outcome Measure Help needed turning from your back to your side while in a flat bed without using bedrails?: None Help needed moving from lying on your back to sitting on the side of a flat bed without using bedrails?: None Help needed moving to and from a bed to a chair (including a wheelchair)?: A Little Help needed standing up from a chair using your arms (e.g., wheelchair or bedside chair)?: A Little Help needed to walk in hospital room?: A Little Help needed climbing 3-5 steps with a railing? : A Lot 6 Click Score: 19    End of Session Equipment Utilized During Treatment: (none) Activity Tolerance: Patient tolerated treatment well Patient left: in chair;with call bell/phone within reach;with family/visitor present Nurse Communication: Mobility status PT Visit Diagnosis: Muscle weakness (generalized) (M62.81)    Time: 8280-0349 PT Time Calculation (min) (ACUTE ONLY): 22 min   Charges:   PT Evaluation $PT Eval Moderate Complexity: 1 Mod          Zenaida Niece, PT,  DPT Acute Rehabilitation Pager: [phone]   Zenaida Niece 07/29/2019, 5:28 PM

## 2019-07-29 NOTE — Progress Notes (Signed)
[facility] ROUNDING NOTE   Subjective:   This is a 73 year old lady with a history of diastolic heart failure, COPD, chronic kidney disease stage III/IV baseline serum creatinine appears to be at 3 mg/dL., obesity, obstructive sleep apnea, atrial fibrillation on chronic anticoagulation treatment, gastroesophageal reflux disease, hiatal hernia, gout, diabetes mellitus type 2.  She presented to [facility] 07/17/2019 with coffee-ground emesis EGD showed esophagitis but no active bleeding ulcers.  She also was found to have Enterobacter urinary tract infection and started on ciprofloxacin.  During her hospital stay she developed atrial fibrillation rapid ventricular rate and required amiodarone.    She was transferred to [facility] 07/25/2019 with acute on chronic kidney disease thought to be secondary to hypotension due to the atrial fibrillation and for the initiation of CRRT.. .  She will underwent insertion of right femoral triple-lumen catheter by critical care medicine 07/25/2019.  Appreciate assistance.  Continues with anuric renal failure slight negative balance of 100 to 150 cc an hour.  Continue as tolerated  Urinalysis showed 100 mg/dL protein 6-10 RBCs and greater than 50 WBCs.  Culture positive for Enterobacter.  Blood pressure 112/96 pulse 78 temperature 97.6 O2 sats 100% room air  IV amiodarone IV ciprofloxacin  Sodium 135 potassium 4.5 chloride 102 CO2 28 BUN 8 creatinine 1.74 glucose 97 calcium 7.8 phosphorus 1.7 magnesium 2.1 albumin 1.8 WBC 8.9 hemoglobin 7.4 platelets 180   Results of abdominal CT 07/17/2019  stable large hiatal hernia with majority of the stomach in the chest.Increased bibasilar atelectasisRight middle lobe 5 mm pulmonary nodules remain indeterminate.    Objective:  Vital signs in last 24 hours:  Temp:  [97.6 F (36.4 C)-98.3 F (36.8 C)] 97.7 F (36.5 C) (12/24 0743) Pulse Rate:  [56-79] 79 (12/24 0900) Resp:   [8-33] 21 (12/24 0900) BP: (69-129)/(35-96) 112/96 (12/24 0900) SpO2:  [94 %-100 %] 98 % (12/24 0900) Weight:  [88.4 kg] 88.4 kg (12/24 0302)  Weight change: -2 kg Filed Weights   07/27/19 0500 07/28/19 0432 07/29/19 0302  Weight: 94 kg 90.4 kg 88.4 kg    Intake/Output: I/O last 3 completed shifts: In: 2377.1 [P.O.:570; I.V.:1206.9; IV Piggyback:600.2] Out: 4916 [Urine:25; Other:4891]   Intake/Output this shift:  Total I/O In: 226.5 [P.O.:200; I.V.:26.5] Out: 136 [Other:136]  Gen: chronically ill appearing, obese WF CVS: tachycardic Resp: cta Abd: +BS,soft, NT/ND Ext:  1+ pitting edema   Basic Metabolic Panel: Recent Labs  Lab 07/25/19 0257 07/26/19 0352 07/27/19 0445 07/27/19 1600 07/28/19 0420 07/28/19 1644 07/29/19 0300  NA 134* 133* 134* 135 135 136 135  K 4.9 4.6 4.4 4.2 4.4 4.8 4.5  CL 93* 95* 98 102 101 102 102  CO2 25 25 27 25 26 28 28   GLUCOSE 123* 126* 104* 133* 109* 126* 97  BUN 103* 64* 24* 17 11 9 8   CREATININE 7.98* 5.54* 2.86* 2.21* 1.99* 1.93* 1.74*  CALCIUM 7.5* 7.5* 7.7* 7.6* 7.8* 8.2* 7.8*  MG 1.4* 1.9 2.0  --  2.1  --  2.1  PHOS 7.3* 4.8* 2.7 1.9* 1.7*  1.6* 1.4* 1.7*    Liver Function Tests: Recent Labs  Lab 07/27/19 0445 07/27/19 1600 07/28/19 0420 07/28/19 1644 07/29/19 0300  ALBUMIN 1.6* 1.8* 1.7* 1.9* 1.8*   No results for input(s): LIPASE, AMYLASE in the last 168 hours. No results for input(s): AMMONIA in the last 168 hours.  CBC: Recent Labs  Lab 07/23/19 0351 07/23/19 1843 07/24/19 0425 07/26/19 0352 07/28/19 0420 07/29/19 0300  WBC 6.7  --  7.8 9.2 8.3 8.9  HGB 6.9* 9.4* 8.7* 9.6* 8.0* 7.4*  HCT 22.6* 30.4* 28.5* 29.7* 25.8* 23.8*  MCV 91.5  --  88.8 87.4 87.5 87.8  PLT 244  --  226 217 177 180    Cardiac Enzymes: No results for input(s): CKTOTAL, CKMB, CKMBINDEX, TROPONINI in the last 168 hours.  BNP: Invalid input(s): POCBNP  CBG: Recent Labs  Lab 07/28/19 0752 07/28/19 1222 07/28/19 1552  07/28/19 2105 07/29/19 0644  GLUCAP 119* 111* 113* 125* 88    Microbiology: Results for orders placed or performed during the hospital encounter of 07/17/19  Urine culture     Status: Abnormal   Collection Time: 07/17/19  9:40 AM   Specimen: Urine, Clean Catch  Result Value Ref Range Status   Specimen Description   Final    URINE, CLEAN CATCH Performed at [facility], [address], [place], [place] [address]    Special Requests   Final    NONE Performed at [facility], [address], [place], [place] [address]    Culture >=100,000 COLONIES/mL ENTEROBACTER AEROGENES (A)  Final   Report Status 07/20/2019 FINAL  Final   Organism ID, Bacteria ENTEROBACTER AEROGENES (A)  Final      Susceptibility   Enterobacter aerogenes - MIC*    CEFAZOLIN >=64 RESISTANT Resistant     CEFTRIAXONE <=1 SENSITIVE Sensitive     CIPROFLOXACIN <=0.25 SENSITIVE Sensitive     GENTAMICIN <=1 SENSITIVE Sensitive     IMIPENEM 1 SENSITIVE Sensitive     NITROFURANTOIN 64 INTERMEDIATE Intermediate     TRIMETH/SULFA <=20 SENSITIVE Sensitive     PIP/TAZO <=4 SENSITIVE Sensitive     * >=100,000 COLONIES/mL ENTEROBACTER AEROGENES  SARS CORONAVIRUS 2 (TAT 6-24 HRS) Nasopharyngeal Nasopharyngeal Swab     Status: None   Collection Time: 07/17/19  2:31 PM   Specimen: Nasopharyngeal Swab  Result Value Ref Range Status   SARS Coronavirus 2 NEGATIVE NEGATIVE Final    Comment: (NOTE) SARS-CoV-2 target nucleic acids are NOT DETECTED. The SARS-CoV-2 RNA is generally detectable in upper and lower respiratory specimens during the acute phase of infection. Negative results do not preclude SARS-CoV-2 infection, do not rule out co-infections with other pathogens, and should not be used as the sole basis for treatment or other patient management decisions. Negative results must be combined with clinical observations, patient history, and epidemiological information. The expected result is Negative. Fact Sheet for  Patients: SugarRoll.be Fact Sheet for Healthcare Providers: https://www.woods-mathews.com/ This test is not yet approved or cleared by the [place] FDA and  has been authorized for detection and/or diagnosis of SARS-CoV-2 by FDA under an Emergency Use Authorization (EUA). This EUA will remain  in effect (meaning this test can be used) for the duration of the COVID-19 declaration under Section 56 4(b)(1) of the Act, 21 U.S.C. section 360bbb-3(b)(1), unless the authorization is terminated or revoked sooner. Performed at [facility], [place] [address] [place], [place] [address]   MRSA PCR Screening     Status: None   Collection Time: 07/17/19 10:00 PM   Specimen: Nasal Mucosa; Nasopharyngeal  Result Value Ref Range Status   MRSA by PCR NEGATIVE NEGATIVE Final    Comment:        The GeneXpert MRSA Assay (FDA approved for NASAL specimens only), is one component of a comprehensive MRSA colonization surveillance program. It is not intended to diagnose MRSA infection nor to guide or monitor treatment for MRSA  infections. Performed at [facility], [address], [place], [place] [address]   Novel Coronavirus, NAA (hospital order; send-out to ref lab)     Status: None   Collection Time: 07/21/19 11:00 AM   Specimen: Nasopharyngeal Swab; Respiratory  Result Value Ref Range Status   SARS-CoV-2, NAA NOT DETECTED NOT DETECTED Final    Comment: (NOTE) This nucleic acid amplification test was developed and its performance characteristics determined by [employer]. Nucleic acid amplification tests include PCR and TMA. This test has not been FDA cleared or approved. This test has been authorized by FDA under an Emergency Use Authorization (EUA). This test is only authorized for the duration of time the declaration that circumstances exist justifying the authorization of the emergency use of in vitro diagnostic tests for detection  of SARS-CoV-2 virus and/or diagnosis of COVID-19 infection under section 564(b)(1) of the Act, 21 U.S.C. 629UTM-5(Y) (1), unless the authorization is terminated or revoked sooner. When diagnostic testing is negative, the possibility of a false negative result should be considered in the context of a patient's recent exposures and the presence of clinical signs and symptoms consistent with COVID-19. An individual without symptoms of COVID- 19 and who is not shedding SARS-CoV-2 vi rus would expect to have a negative (not detected) result in this assay. Performed At: [facility] [address] [place], [place] [id number] Katina Degree MDPhD CL:[id number]    Coronavirus Source NASOPHARYNGEAL  Final    Comment: Performed at [facility], [address] [place], [place] [address]    Coagulation Studies: No results for input(s): LABPROT, INR in the last 72 hours.  Urinalysis: No results for input(s): COLORURINE, LABSPEC, PHURINE, GLUCOSEU, HGBUR, BILIRUBINUR, KETONESUR, PROTEINUR, UROBILINOGEN, NITRITE, LEUKOCYTESUR in the last 72 hours.  Invalid input(s): APPERANCEUR    Imaging: No results found.   Medications:   .  prismasol BGK 4/2.5 500 mL/hr at 07/29/19 0808  .  prismasol BGK 4/2.5 300 mL/hr at 07/28/19 2205  . sodium chloride 10 mL/hr at 07/29/19 0800  . ciprofloxacin Stopped (07/28/19 2201)  . heparin 10,000 units/ 20 mL infusion syringe Stopped (07/29/19 0850)  . phenylephrine (NEO-SYNEPHRINE) Adult infusion Stopped (07/28/19 0343)  . prismasol BGK 4/2.5 1,000 mL/hr at 07/29/19 0806   . sodium chloride   Intravenous Once  . sodium chloride   Intravenous Once  . allopurinol  100 mg Oral Daily  . amiodarone  200 mg Oral Daily  . atorvastatin  40 mg Oral QHS  . B-complex with vitamin C  1 tablet Oral Daily  . Chlorhexidine Gluconate Cloth  6 each Topical Daily  . darbepoetin (ARANESP) injection - NON-DIALYSIS  150 mcg Subcutaneous Q Thu-1800  . docusate sodium  100 mg  Oral Daily  . feeding supplement (ENSURE ENLIVE)  237 mL Oral BID WC  . feeding supplement (PRO-STAT SUGAR FREE 64)  30 mL Oral BID  . umeclidinium bromide  1 puff Inhalation Daily   And  . fluticasone furoate-vilanterol  1 puff Inhalation Daily  . insulin aspart  0-9 Units Subcutaneous TID WC  . insulin glargine  5 Units Subcutaneous QHS  . metoprolol tartrate  25 mg Oral BID  . pantoprazole  40 mg Oral BID AC  . sertraline  50 mg Oral Daily  . sodium chloride flush  10-40 mL Intracatheter Q12H  . sodium chloride flush  3 mL Intravenous Q12H   sodium chloride, acetaminophen **OR** acetaminophen, albuterol, heparin, heparin, heparin, sodium chloride flush, sodium chloride flush  Assessment/ Plan:  1. AKI/CKD stage 4- in setting of atrial fibrillation, hypotension, h/o NSAIDs, and ABLA due to UGIB. She remains oliguric. Also with h/o bladder outlet obstruction. CRRT initiated 07/25/2019.  Seems to be doing better with heparin.  Have noticed a slight drop in hemoglobin from 07/26/2019 to 07/28/2019 however appears to have stabilized 07/29/2019.  Continue to follow.  We may be able to transition to intermittent hemodialysis after the weekend. 2. Hematemesis/UGIB- seen by GI and EGD with erosive reflux esophagitis. Transfused 2 units PRBC's on 07/23/19.    Restarted heparin on CRRT 07/27/2019.Marland Kitchen  Continues on Protonix 40 mg twice daily  3. Atrial fibrillation with RVR and ongoing hypotension- on amiodarone drip. Continues on metoprolol 25 mg twice daily.  Apixaban as outpatient.  Using heparin with caution due to history of GI bleed.  Hemoglobin stable at this point. 4. UTI -Enterobacter  on cipro day 4/7 5. Diastolic congestive heart failure torsemide being held.  Home dose 50 mg daily 6. Depression Zoloft 50 mg daily 7. Gout allopurinol 100 mg daily 8. Diabetes mellitus per primary team 9. Hyperlipidemia continue Lipitor. 10. Hypophosphatemia we will give 20 mEq today.    LOS:  [place] @TODAY @9 :11 AM

## 2019-07-29 NOTE — Progress Notes (Signed)
NAME:  [name], MRN:  [id number], DOB:  [birth year], LOS: 42 ADMISSION DATE:  07/17/2019, CONSULTATION DATE:  07/25/2019 REFERRING MD:  Forestine Na, CHIEF COMPLAINT:  AKI   Brief History    73 yo F with a history of diastolic heart failure, CKD stage IIIb, COPD, chronic respiratory failure, obesity, OSA, Afib (on chronic anticoagulation), GERD, hiatal hernia, gout, DM2, and chronic anemia who presented to [facility] on 12/12 after an episode of coffee ground emesis, nausea and vomiting.  She had an EGD done that showed esophagitis but no active bleeding ulcers.  She was also started on ciprofloaxinc for a UTI.  Unfortunately, throughout her hospital stay, he developed afib with RVR, started on amiodarone, and has had progressive renal failure, and has been anuric the last 24 hours.  She was transferred from [facility] to [facility] for consideration for CRRT.  At the bedside, patient is mentating appropriately and is in no distress.  Denies any significant shortness of breath.    Past Medical History   has a past medical history of Blood transfusion without reported diagnosis, CAD (coronary artery disease), Cataract, Cervical cancer ([place]) (1978), CHF (congestive heart failure) ([place]), Chronic back pain, Chronic kidney disease, COPD (chronic obstructive pulmonary disease) ([place]), Degenerative disc disease, Essential hypertension, benign, GERD (gastroesophageal reflux disease), Gout, Hiatal hernia (07/27/2013), History of diverticulitis of colon, History of hiatal hernia, Iron deficiency anemia, Irritable bowel syndrome, Lumbar radiculopathy, Mixed hyperlipidemia, Moderate major depression, single episode ([place]) (07/21/2019), Neuropathy, Osteoporosis, Ovarian cancer ([place]) (1978), Oxygen deficiency, Sleep apnea, Type 2 diabetes mellitus ([place]), Vitamin B deficiency (12/25/2009), and Vitamin B12 deficiency.   has a past surgical history that includes Esophagogastroduodenoscopy (11/19/2006); Colonoscopy  (10/01/2006); Cholecystectomy; Partial hysterectomy (1978); Tonsillectomy and adenoidectomy; Umbilical hernia repair (2010); Benign breast cysts; Two back surgeries/fusion; Abdominal hysterectomy; Back surgery; Esophagogastroduodenoscopy (10/01/2006); Esophagogastroduodenoscopy (2011); Colonoscopy (2011); small bowel capsule (2008); Esophagogastroduodenoscopy (N/A, 08/06/2013); Givens capsule study (N/A, 08/06/2013); Knee surgery (Right); Colonoscopy (N/A, 01/26/2016); Spine surgery; and Esophagogastroduodenoscopy (egd) with propofol (N/A, 07/19/2019).   Significant Hospital Events   12/14 EGD: esophagitis, Schatzki ring dilation 12/15 Afib with RVR: started on amiodarone 12/16: AKI on CKD, renal consulted initially 12/22: started on neo gtt early this am 07/28/2019-on neo as of 0400 this am 2/2 hypotension and inability to pull volume with crrt. Unsure if needed to pull volume with pt on baseline oxygen, without pulm edema or peripheral edema. See wt's are up but unsure if accurate. Ordered echo to determine LV/RV function and help guide management.   Consults:  Cardiology Nephrology GI  Procedures:  EGD 2/14  Significant Diagnostic Tests:  12/12 CT A/P without contrast: stable large hiatal hernia with majority of the stomach in the Chest. Increased bibasilar atelectasis. Right middle lobe 5 mm pulmonary nodules remain indeterminate.  Diverticulosis without acute inflammatory process. Improved body anasarca  Micro Data:  COVID-19 12/16--> negative Urine culture 12/12--> enterobacter aerogenes.   Antimicrobials:  Ciprofloxacin 12/17->  Interim history/subjective:   Continues on CRRT without issue. Denies dyspnea or abdominal pain. Eating 50% of meals.  Objective   Blood pressure (!) 117/49, pulse 73, temperature 97.7 F (36.5 C), temperature source Oral, resp. rate (!) 26, height 4\' 11"  (1.499 m), weight 88.4 kg, SpO2 98 %.        Intake/Output Summary (Last 24 hours) at  07/29/2019 0851 Last data filed at 07/29/2019 0800 Gross per 24 hour  Intake 1604.27 ml  Output 3502 ml  Net -1897.73 ml   [employer]  07/27/19 0500 07/28/19 0432 07/29/19 0302  Weight: 94 kg 90.4 kg 88.4 kg    Examination:  General Appearance: Morbidly obese.  Not intubated not sedated.  Lying in bed. Head:  Normocephalic, without obvious abnormality, atraumatic Eyes:  PERRL - yes, conjunctiva/corneas -clear Lungs: Clear to auscultation bilaterally,   Heart:  S1 and S2 normal, no murmur,  Abdomen:  Soft, no masses, no organomegaly Genitalia / Rectal:  Not done Extremities:  No c/c trace BL E edema and no upper extremity edema Skin:  intact in exposed areas .  HD catheter site intact. Neurologic:  arousable and follows commands. conversant   Resolved Hospital Problem list   UGIB due to esophagitis Shock.  Assessment & Plan:  73 yo F who presented with UGIB due to esophagitis in the setting of GERD and meloxicam use, hospital course complicated with Afib with RVR, and now with worsening AKI on CKD, transferred from [facility] to [facility] for consideration for CRRT.    Chronic hypoxic respiratory insufficiency due to HF and obesity. Persistent atrial fibrillation with ventricular rate now controlled with amiodarone IV AKI on CKD4 now on CRRT Chronic diastolic CHF COPD Gout OSA UTI, enterobacter aerogenes  General improvement as shock is now resolved and patient mentating clearly. On CRRT for volume overload. Plan to continue CRRT through the weekend and obtain permanent HD access. Ready to transition to IHD  Daily Goals Checklist  Pain/Anxiety/Delirium protocol (if indicated): Delirium monitoring only. VAP protocol (if indicated): not intubated. Respiratory support goals: Incentive spirometry. Blood pressure target: MAP>65. Amiodarone to oral today. DVT prophylaxis: Systemic heparin for HD circuit Nutrition Status: moderate malnutrition due to critical  illness. GI prophylaxis: BID Pantoprazole following UGIB Fluid status goals: Positive fluid balance. Increase fluid removal via CRRT to -112ml/h. Urinary catheter: removed due to anuria Central lines: post in place, temporary right femoral HD line. Glucose control: euglycemic Mobility/therapy needs: Ambulate with assistance Antibiotic de-escalation: Complete course of Cipro for UTI Home medication reconciliation: Relevant medications reordered. Daily labs: Daily CBC and renal panel Code Status: Full code  Family Communication: Will update today Disposition: ICU   LABS    PULMONARY No results for input(s): PHART, PCO2ART, PO2ART, HCO3, TCO2, O2SAT in the last 168 hours.  Invalid input(s): PCO2, PO2  CBC Recent Labs  Lab 07/26/19 0352 07/28/19 0420 07/29/19 0300  HGB 9.6* 8.0* 7.4*  HCT 29.7* 25.8* 23.8*  WBC 9.2 8.3 8.9  PLT 217 177 180    COAGULATION No results for input(s): INR in the last 168 hours.  CARDIAC  No results for input(s): TROPONINI in the last 168 hours. No results for input(s): PROBNP in the last 168 hours.   CHEMISTRY Recent Labs  Lab 07/25/19 0257 07/26/19 0352 07/27/19 0445 07/27/19 1600 07/28/19 0420 07/28/19 1644 07/29/19 0300  NA 134* 133* 134* 135 135 136 135  K 4.9 4.6 4.4 4.2 4.4 4.8 4.5  CL 93* 95* 98 102 101 102 102  CO2 25 25 27 25 26 28 28   GLUCOSE 123* 126* 104* 133* 109* 126* 97  BUN 103* 64* 24* 17 11 9 8   CREATININE 7.98* 5.54* 2.86* 2.21* 1.99* 1.93* 1.74*  CALCIUM 7.5* 7.5* 7.7* 7.6* 7.8* 8.2* 7.8*  MG 1.4* 1.9 2.0  --  2.1  --  2.1  PHOS 7.3* 4.8* 2.7 1.9* 1.7*  1.6* 1.4* 1.7*   Estimated Creatinine Clearance: 27.4 mL/min (A) (by C-G formula based on SCr of 1.74 mg/dL (H)).   LIVER Recent Labs  Lab 07/27/19 0445 07/27/19 1600 07/28/19 0420 07/28/19 1644 07/29/19 0300  ALBUMIN 1.6* 1.8* 1.7* 1.9* 1.8*     INFECTIOUS No results for input(s): LATICACIDVEN, PROCALCITON in the last 168  hours.   ENDOCRINE CBG (last 3)  Recent Labs    07/28/19 1552 07/28/19 2105 07/29/19 0644  GLUCAP 113* 125* 88       Kipp Brood, MD [facility] ICU Physician [place]  Pager: [phone] Mobile: [phone] After hours: [phone].  07/29/2019, 8:51 AM

## 2019-07-29 NOTE — Progress Notes (Signed)
Discussed with CCM  will transition to Intermittent dialysis -  Need for CVVHD machines

## 2019-07-29 NOTE — Progress Notes (Signed)
Pt's Hgb 7.4 this AM- increased fatigue/drowines.  Notified Nephrology MD Carolynne Edouard- MD. No new orders at this time.

## 2019-07-29 NOTE — Progress Notes (Signed)
Pt had already been set up on home CPAP when I arrived to pt room and is resting comfortably.

## 2019-07-29 NOTE — Progress Notes (Signed)
CRITICAL VALUE ALERT  Critical Value:  APTT 196  Date & Time Notied:  07/29/2019 0830  Provider Notified: Kipp Brood  Orders Received/Actions taken: None

## 2019-07-29 NOTE — Consult Note (Signed)
Chief Complaint: Patient was seen in consultation today for tunneled dialysis catheter placement Chief Complaint  Patient presents with   Emesis   Abdominal Pain   at the request of Dr Justin Mend   Supervising Physician: Arne Cleveland  Patient Status: [facility] - In-pt  History of Present Illness: [name] is a 73 y.o. female   Diastolic CHF; COPD; OSA: Afib; DM CKD 3/4 Was admitted to 07/17/19 [facility] with GI Bleed (esophagitis) Tx to [facility] with A/CKD-- secondary hypotension And  CRRT CCM placed Rt femoral triple lumen catheter for access/dialysis 07/25/19 Continues with anuric renal failure/ oliguric  Nephrology requesting new tunneled catheter for ongoing dialysis needs Scheduled for same today Pt is on Heparin drip-- will hold for few hrs before procedure  Past Medical History:  Diagnosis Date   Blood transfusion without reported diagnosis    CAD (coronary artery disease)    Nonobstructive at cardiac catheterization 2000   Cataract    Cervical cancer ([place]) 1978   CHF (congestive heart failure) ([place])    Chronic back pain    Chronic kidney disease    COPD (chronic obstructive pulmonary disease) ([place])    Degenerative disc disease    Essential hypertension, benign    GERD (gastroesophageal reflux disease)    Gout    Hiatal hernia 07/27/2013   History of diverticulitis of colon    History of hiatal hernia    Iron deficiency anemia    Irritable bowel syndrome    Lumbar radiculopathy    Mixed hyperlipidemia    Moderate major depression, single episode ([place]) 07/21/2019   Neuropathy    Osteoporosis    Ovarian cancer ([place]) 1978   patient denies. States this was cervical cancer   Oxygen deficiency    Sleep apnea    Type 2 diabetes mellitus ([place])    Vitamin B deficiency 12/25/2009   Vitamin B12 deficiency     Past Surgical History:  Procedure Laterality Date   ABDOMINAL HYSTERECTOMY     BACK SURGERY     Benign breast cysts      CHOLECYSTECTOMY     COLONOSCOPY  10/01/2006   SLF:Pan colonic diverticulosis and moderate internal hemorrhoids/ Otherwise no polyps, masses, inflammatory changes or AVMs/   COLONOSCOPY  2011   SLF: pancolonic diverticulosis, large internal hemorrhoids   COLONOSCOPY N/A 01/26/2016   Procedure: COLONOSCOPY;  Surgeon: [name], MD;  [facility];  Service: Endoscopy;  Laterality: N/A;  830    ESOPHAGOGASTRODUODENOSCOPY  11/19/2006   SLF: Large hiatal hernia without evidence of Cameron ulcers/. Distal esophageal stricture, which allowed the gastroscope to pass without resistance.  A 16 mm Savary later passed with mild resistance/ Normal stomach.sb bx negative   ESOPHAGOGASTRODUODENOSCOPY  10/01/2006   ONG:EXBMW hiatal hernia.  Distal esophagus without evidence of   erythema, ulceration or Barrett's esophagus   ESOPHAGOGASTRODUODENOSCOPY  2011   SLF: large hh, distal esophageal web narrowing to 50mm s/p dilation to 68mm   ESOPHAGOGASTRODUODENOSCOPY N/A 08/06/2013   SLF: 1. Stricture at the gastroesophageal junction 2. large hiatal hernia. 3. Mild erosive gastritis.   ESOPHAGOGASTRODUODENOSCOPY (EGD) WITH PROPOFOL N/A 07/19/2019   Procedure: ESOPHAGOGASTRODUODENOSCOPY (EGD) WITH POSSIBLE ESOPHAGEAL DILATION WITH PROPOFOL;  Surgeon: Daneil Dolin, MD;  Location: [facility];  Service: Endoscopy;  Laterality: N/A;   GIVENS CAPSULE STUDY N/A 08/06/2013   INCOMPLETE-SMALL BOWLE ULCERS   KNEE SURGERY Right    PARTIAL HYSTERECTOMY  1978   small bowel capsule  2008  negative   SPINE SURGERY     TONSILLECTOMY AND ADENOIDECTOMY     Two back surgeries/fusion     UMBILICAL HERNIA REPAIR  2010    Allergies: Patient has no known allergies.  Medications: Prior to Admission medications   Medication Sig Start Date End Date Taking? Authorizing Provider  albuterol (PROVENTIL) (2.5 MG/3ML) 0.083% nebulizer solution Take 2.5 mg by nebulization every 6 (six) hours as  needed for wheezing or shortness of breath.  04/09/19  Yes [provider]  albuterol (VENTOLIN HFA) 108 (90 Base) MCG/ACT inhaler Inhale 2 puffs into the lungs every 6 (six) hours as needed for wheezing or shortness of breath.  04/09/19  Yes [provider]  fluconazole (DIFLUCAN) 50 MG tablet Take 50 mg by mouth daily. Started on 07/15/19 per Nurse at [facility].   Yes [provider]  mirtazapine (REMERON) 15 MG tablet Take 15 mg by mouth at bedtime.   Yes [provider]  NONFORMULARY OR COMPOUNDED ITEM May use CPaP from home at previous home settings while asleep. Every Shift Day, Evening, Night   Yes [provider]  OXYGEN Inhale 2 L into the lungs. Every Shift   Yes [provider]  allopurinol (ZYLOPRIM) 100 MG tablet Take 100 mg by mouth 2 (two) times daily.  10/09/12   [provider]  apixaban (ELIQUIS) 5 MG TABS tablet Take 1 tablet (5 mg total) by mouth 2 (two) times daily. 06/21/19   Johnson, Clanford L, MD  aspirin EC 81 MG tablet Take 81 mg by mouth daily.    [provider]  atorvastatin (LIPITOR) 40 MG tablet Take 1 tablet (40 mg total) by mouth at bedtime. 05/04/18   Satira Sark, MD  Biotin 5 MG CAPS Take 5 mg by mouth daily.    [provider]  diltiazem (CARDIZEM CD) 120 MG 24 hr capsule Take 1 capsule (120 mg total) by mouth daily. 06/22/19   Johnson, Clanford L, MD  esomeprazole (NEXIUM) 40 MG capsule TAKE 1 CAPSULE TWICE A DAY 30 MINUTES PRIOR TO MEALS 01/19/18   Annitta Needs, NP  famotidine (PEPCID) 40 MG tablet TAKE 1 TABLET AT BEDTIME AS NEEDED TO CONTROL REFLUX Patient not taking: Reported on 07/19/2019 06/23/15   Mannam, Hart Robinsons, MD  feeding supplement, GLUCERNA SHAKE, (GLUCERNA SHAKE) LIQD Take 237 mLs by mouth 2 (two) times daily between meals.    [provider]  hydrALAZINE (APRESOLINE) 25 MG tablet Take 25 mg by mouth 2 (two) times daily.    [provider]  LANTUS  SOLOSTAR 100 UNIT/ML SOPN Inject 8 Units into the skin at bedtime.  10/06/12   [provider]  loperamide (IMODIUM) 2 MG capsule Take 2 mg by mouth every 6 (six) hours as needed for diarrhea or loose stools.     [provider]  metoprolol succinate (TOPROL-XL) 25 MG 24 hr tablet Take 25 mg by mouth daily. Along with 50 mg to = 75 mg    [provider]  metoprolol succinate (TOPROL-XL) 50 MG 24 hr tablet Take 50 mg by mouth daily. Along with 25 mg to = 75 mg    [provider]  potassium chloride SA (KLOR-CON) 10 MEQ tablet Take 1 tablet (10 mEq total) by mouth every other day. 07/08/19   Johnson, Clanford L, MD  sertraline (ZOLOFT) 50 MG tablet Take 50 mg by mouth daily.    [provider]  tamsulosin (FLOMAX) 0.4 MG CAPS capsule Take 1  capsule (0.4 mg total) by mouth daily. 07/08/19   Johnson, Clanford L, MD  torsemide (DEMADEX) 10 MG tablet Take 5 tablets (50 mg total) by mouth daily. 07/09/19   Johnson, Clanford L, MD  TRELEGY ELLIPTA 100-62.5-25 MCG/INH AEPB Inhale 1 puff into the lungs daily. 05/13/19   [provider]     Family History  Problem Relation Age of Onset   Colon cancer Brother        diagnosed age 42. Living.    Ulcers Sister    Diabetes Sister    Heart attack Sister    Kidney failure Sister    Stroke Sister    Ulcers Mother    Diabetes Mother    Heart attack Mother    Stroke Mother    Asthma Mother    Heart disease Mother    Cervical cancer Mother    Heart attack Brother    Heart disease Brother    Asthma Sister    Diabetes Brother    Stroke Maternal Grandmother    Heart attack Maternal Grandmother    Heart attack Other    Early death Father        MVA in his 21s    Social History   Socioeconomic History   Marital status: Widowed    Spouse name: Kauffmann   Number of children: 4   Years of education: Not on file   Highest education level: 10th grade  Occupational History    Occupation: retired    Comment: Ambulance person, [employer] work  Tobacco Use   Smoking status: Former Smoker    Packs/day: 1.00    Years: 1.00    Pack years: 1.00    Types: Cigarettes    Start date: 02/19/1961    Quit date: 08/05/1961    Years since quitting: 58.0   Smokeless tobacco: Never Used   Tobacco comment: Quit smoking x 50 years  Substance and Sexual Activity   Alcohol use: No   Drug use: No   Sexual activity: Not Currently  Other Topics Concern   Not on file  Social History Narrative   HAS 4 SON-GRAND [place].   Lives in home with [place]eng[place] - married 13 Y   Cook, sew, quilt, crochet   Social Determinants of Health   Financial Resource Strain:    Difficulty of Paying Living Expenses: Not on file  Food Insecurity:    Worried About Charity fundraiser in the Last Year: Not on file   [employer] of Food in the Last Year: Not on file  Transportation Needs:    Film/video editor (Medical): Not on file   Lack of Transportation (Non-Medical): Not on file  Physical Activity:    Days of Exercise per Week: Not on file   Minutes of Exercise per Session: Not on file  Stress:    Feeling of Stress : Not on file  Social Connections:    Frequency of Communication with Friends and Family: Not on file   Frequency of Social Gatherings with Friends and Family: Not on file   Attends Religious Services: Not on file   Active Member of Clubs or Organizations: Not on file   Attends Archivist Meetings: Not on file   Marital Status: Not on file    Review of Systems: A 12 point ROS discussed and pertinent positives are indicated in the HPI above.  All other systems are negative.  Review of Systems  Constitutional: Positive for activity  change and fatigue. Negative for fever and unexpected weight change.  Respiratory: Positive for shortness of breath. Negative for cough.   Cardiovascular: Negative for chest pain.  Gastrointestinal: Negative for  abdominal pain.  Neurological: Positive for weakness.  Psychiatric/Behavioral: Negative for behavioral problems and confusion.    Vital Signs: BP (!) 112/96    Pulse 79    Temp 97.7 F (36.5 C) (Oral)    Resp (!) 21    Ht 4\' 11"  (1.499 m)    Wt 194 lb 14.2 oz (88.4 kg)    SpO2 98%    BMI 39.36 kg/m   Physical Exam Vitals reviewed.  Cardiovascular:     Rate and Rhythm: Rhythm irregular.  Pulmonary:     Breath sounds: Normal breath sounds.  Abdominal:     Tenderness: There is no abdominal tenderness.  Skin:    General: Skin is warm and dry.     Comments: Rt femoral vas cath  Neurological:     Mental Status: She is alert and oriented to person, place, and time.  Psychiatric:        Behavior: Behavior normal.     Imaging: CT ABDOMEN PELVIS WO CONTRAST  Result Date: 07/17/2019 CLINICAL DATA:  Abdominal pain, nausea and vomiting EXAM: CT ABDOMEN AND PELVIS WITHOUT CONTRAST TECHNIQUE: Multidetector CT imaging of the abdomen and pelvis was performed following the standard protocol without IV contrast. COMPARISON:  None. FINDINGS: Lower chest: Increased bibasilar bandlike opacities consistent with atelectasis. Right middle lobe indeterminate small nodules noted, series 4, images 3 and 7. These measure 5 mm. Stable cardiomegaly. No pericardial or pleural effusion. Large hiatal hernia again noted with the majority of the stomach in the chest. Hepatobiliary: Limited without IV contrast. Remote cholecystectomy. No intrahepatic biliary dilatation or large focal hepatic abnormality. Pancreas: Unremarkable. No pancreatic ductal dilatation or surrounding inflammatory changes. Spleen: Normal in size without focal abnormality. Adrenals/Urinary Tract: Normal adrenal glands. Kidneys demonstrate mild chronic perinephric strandy edema. Renal obstruction or hydronephrosis. No hydroureter or obstructing ureteral calculus adjacent chronic calcified gonadal vein phleboliths noted on the left. Urinary bladder  unremarkable. Stomach/Bowel: Large hiatal hernia again noted. Majority of stomach in the chest. Negative for obstruction, significant dilatation, ileus, or free air. Extensive colonic diverticulosis without acute focal inflammatory process or wall thickening. No fluid collection, hemorrhage, hematoma, abscess or ascites. Vascular/Lymphatic: Abdominal atherosclerosis noted. Negative for aneurysm. No retroperitoneal hemorrhage or hematoma. No bulky adenopathy. Reproductive: Status post hysterectomy. No adnexal masses. Other: No abdominal wall hernia or abnormality. No abdominopelvic ascites. Musculoskeletal: Remote lumbar spinal fusion. No acute osseous finding. Improved body anasarca compared to the prior study. IMPRESSION: Stable large hiatal hernia with majority of the stomach in the chest. Increased bibasilar atelectasis Right middle lobe 5 mm pulmonary nodules remain indeterminate. No follow-up needed if patient is low-risk. Non-contrast chest CT can be considered in 12 months if patient is high-risk. This recommendation follows the consensus statement: Guidelines for Management of Incidental Pulmonary Nodules Detected on CT Images: From the Fleischner Society 2017; Radiology 2017; 284:228-243. Diverticulosis without acute inflammatory process Improved body anasarca Electronically Signed   By: Jerilynn Mages.  Shick M.D.   On: 07/17/2019 13:06   DG Chest 2 View  Result Date: 07/05/2019 CLINICAL DATA:  Congestion and shortness of breath EXAM: CHEST - 2 VIEW COMPARISON:  Chest radiograph July 04, 2019 FINDINGS: Port-A-Cath tip is in the superior vena cava. No pneumothorax. There is mild interstitial prominence with suspected mild interstitial edema. No consolidation. Heart is mildly enlarged with  pulmonary venous hypertension. No evident adenopathy. 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 There is degenerative change in the thoracic spine. IMPRESSION: There is a degree of pulmonary vascular congestion with mild  interstitial edema. No consolidation. There may be a degree of congestive heart failure. Sizable paraesophageal hernia noted. Port-A-Cath tip in superior vena cava. Electronically Signed   By: Lowella Grip III M.D.   On: 07/05/2019 10:52   CT Head Wo Contrast  Result Date: 07/05/2019 CLINICAL DATA:  Unexplained altered level of consciousness. EXAM: CT HEAD WITHOUT CONTRAST TECHNIQUE: Contiguous axial images were obtained from the base of the skull through the vertex without intravenous contrast. COMPARISON:  06/27/2015 FINDINGS: Brain: Age related atrophy. Mild chronic small-vessel change of the hemispheric white matter and pons. 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 No sign of mass lesion, hemorrhage, hydrocephalus or extra-axial collection. Vascular: There is atherosclerotic calcification of the major vessels at the base of the brain. Skull: Negative Sinuses/Orbits: Paranasal sinuses are clear. Orbits are negative. Mastoid effusion on the right. Other: None IMPRESSION: No acute intracranial finding. Atrophy and chronic small-vessel ischemic changes as seen previously. Electronically Signed   By: Nelson Chimes M.D.   On: 07/05/2019 11:04   US RENAL  Result Date: 07/06/2019 CLINICAL DATA:  Acute kidney injury. EXAM: RENAL / URINARY TRACT ULTRASOUND COMPLETE COMPARISON:  Noncontrast abdominal CT 05/31/2019, renal ultrasound 09/10/2017 FINDINGS: Right Kidney: Renal measurements: 8.9 x 5.6 x 6.1 cm = volume: 160 mL. No hydronephrosis. There is renal cortical thinning with increased parenchymal echogenicity. Mild scarring. Simple cyst measuring 1.2 cm in the upper kidney again seen. No suspicious solid lesion. No shadowing calculi. Left Kidney: Renal measurements: 9.0 x 5.5 x 5.0 cm = volume: 132 mL. No hydronephrosis. There is thinning of renal parenchyma with increased renal echogenicity. Bladder: No bladder wall thickening. Both ureteral jets are visualized. Reported post void residual  of 284 cc. Prevoid bladder volume not recorded. Other: None. IMPRESSION: 1. No hydronephrosis. 2. Thinning of bilateral renal parenchyma with increased renal echogenicity most consistent with chronic medical renal disease. 3. Post void residual of 284 cc. Electronically Signed   By: Keith Rake M.D.   On: 07/06/2019 12:45   DG CHEST PORT 1 VIEW  Result Date: 07/25/2019 CLINICAL DATA:  Central line placement. EXAM: PORTABLE CHEST 1 VIEW COMPARISON:  Chest x-ray dated July 17, 2019. FINDINGS: The patient is rotated to the right. Unchanged right chest wall port catheter with tip in the SVC. Grossly stable cardiomediastinal silhouette accounting for rotation. No focal consolidation, pleural effusion, or pneumothorax. No acute osseous abnormality. IMPRESSION: 1. Unchanged right chest wall port catheter. No new central line identified. 2. No active disease. Electronically Signed   By: Titus Dubin M.D.   On: 07/25/2019 13:08   DG Chest Portable 1 View  Result Date: 07/17/2019 CLINICAL DATA:  Vomiting, abdominal pain. EXAM: PORTABLE CHEST 1 VIEW COMPARISON:  July 05, 2019. FINDINGS: Stable cardiomediastinal silhouette. Right internal jugular Port-A-Cath is unchanged. No pneumothorax or pleural effusion is noted. No acute pulmonary disease is noted. Bony thorax is unremarkable. Stable hiatal hernia. IMPRESSION: No acute cardiopulmonary abnormality seen.  Stable hiatal hernia. Electronically Signed   By: Marijo Conception M.D.   On: 07/17/2019 10:53   DG Chest Portable 1 View  Result Date: 07/04/2019 CLINICAL DATA:  Altered mental status. Recent positive blood culture EXAM: PORTABLE CHEST 1 VIEW COMPARISON:  June 15, 2019 FINDINGS: Port-A-Cath tip is in the superior vena cava. No pneumothorax.  There is no appreciable edema or consolidation. There is slight atelectatic change in the left lower lobe. Heart is upper normal in size with pulmonary vascularity normal. No adenopathy. There is a  large paraesophageal hernia. No bone lesions. IMPRESSION: Large paraesophageal hernia. Mild left base atelectasis. No edema or consolidation. Stable cardiac silhouette. Port-A-Cath tip in superior vena cava. Electronically Signed   By: Lowella Grip III M.D.   On: 07/04/2019 11:26   DG Abd Portable 1 View  Result Date: 07/17/2019 CLINICAL DATA:  Weakness, vomiting. EXAM: PORTABLE ABDOMEN - 1 VIEW COMPARISON:  September 03, 2010. FINDINGS: The bowel gas pattern is normal. No radio-opaque calculi or other significant radiographic abnormality are seen. IMPRESSION: Negative. Electronically Signed   By: Marijo Conception M.D.   On: 07/17/2019 10:53   ECHOCARDIOGRAM LIMITED  Result Date: 07/27/2019   ECHOCARDIOGRAM LIMITED REPORT   Patient Name:   [name] Date of Exam: 07/27/2019 Medical Rec #:  [id number]          Height:       56.0 in Accession #:    [id number]         Weight:       207.2 lb Date of Birth:  June 30, 1946          BSA:          1.80 m Patient Age:    33 years           BP:           77/54 mmHg Patient Gender: F                  HR:           108 bpm. Exam Location:  Inpatient  Procedure: Limited Echo, Limited Color Doppler and Cardiac Doppler Indications:    Cardiomyopathy-Unspecified 425.9 / I42.9  History:        Patient has prior history of Echocardiogram examinations, most                 recent 06/16/2019. Chronic kidney disease on dialysis.  Sonographer:    Darlina Sicilian RDCS Referring Phys: [id number] [place]  1. Left ventricular ejection fraction, by visual estimation, is 55 to 60%. The left ventricle has normal function. There is mildly increased left ventricular hypertrophy.  2. Left ventricular diastolic function could not be evaluated.  3. The left ventricle has no regional wall motion abnormalities.  4. Global right ventricle has normal systolic function.The right ventricular size is normal. No increase in right ventricular wall thickness.  5. Left atrial  size was moderately dilated.  6. Right atrial size was mildly dilated.  7. Mild mitral annular calcification.  8. The mitral valve is degenerative. Trivial mitral valve regurgitation.  9. The tricuspid valve is grossly normal. Tricuspid valve regurgitation moderate. 10. The tricuspid valve was normal in structure. Tricuspid valve regurgitation moderate. 11. The aortic valve is grossly normal. Aortic valve regurgitation is not visualized. 12. Moderately elevated pulmonary artery systolic pressure. 13. The tricuspid regurgitant velocity is 3.12 m/s, and with an assumed right atrial pressure of 15 mmHg, the estimated right ventricular systolic pressure is moderately elevated at 54.0 mmHg. 14. The inferior vena cava is dilated in size with <50% respiratory variability, suggesting right atrial pressure of 15 mmHg. FINDINGS  Left Ventricle: Left ventricular ejection fraction, by visual estimation, is 55 to 60%. The left ventricle has normal function. The left ventricle has no regional wall motion abnormalities. The  left ventricular internal cavity size was the left ventricle is normal in size. There is mildly increased left ventricular wall thickness. Concentric left ventricular hypertrophy. The left ventricular diastology could not be evaluated due to atrial fibrillation. Left ventricular diastolic function could not be evaluated. Right Ventricle: The right ventricular size is normal. No increase in right ventricular wall thickness. Global RV systolic function is has normal systolic function. The tricuspid regurgitant velocity is 3.12 m/s, and with an assumed right atrial pressure  of 15 mmHg, the estimated right ventricular systolic pressure is moderately elevated at 54.0 mmHg. Left Atrium: Left atrial size was moderately dilated. Right Atrium: Right atrial size was mildly dilated. Right atrial pressure is estimated at 15 mmHg. Pericardium: There is no evidence of pericardial effusion is seen. There is no evidence of  pericardial effusion. Mitral Valve: The mitral valve is normal in structure. There is mild thickening of the mitral valve leaflet(s). There is mild calcification of the mitral valve leaflet(s). Mild mitral annular calcification. Mild to moderate mitral valve regurgitation, with centrally-directed jet. Tricuspid Valve: The tricuspid valve is normal in structure. Tricuspid valve regurgitation moderate. Venous: The inferior vena cava is dilated in size with less than 50% respiratory variability, suggesting right atrial pressure of 15 mmHg.  LEFT VENTRICLE          Normals PLAX 2D LVIDd:         4.47 cm  3.6 cm LVIDs:         2.80 cm  1.7 cm LV PW:         1.23 cm  1.4 cm LV IVS:        1.18 cm  1.3 cm LVOT diam:     1.70 cm  2.0 cm LV SV:         62 ml    79 ml LV SV Index:   30.95    45 ml/m2 LVOT Area:     2.27 cm 3.14 cm2  LV Volumes (MOD)             Normals LV area d, A2C:    31.40 cm LV area d, A4C:    37.80 cm LV area s, A2C:    20.50 cm LV area s, A4C:    23.70 cm LV major d, A2C:   7.46 cm LV major d, A4C:   8.08 cm LV major s, A2C:   6.74 cm LV major s, A4C:   7.16 cm LV vol d, MOD A2C: 112.0 ml  68 ml LV vol d, MOD A4C: 146.0 ml LV vol s, MOD A2C: 53.5 ml   24 ml LV vol s, MOD A4C: 65.9 ml LV SV MOD A2C:     58.5 ml LV SV MOD A4C:     146.0 ml LV SV MOD BP:      71.6 ml   45 ml LEFT ATRIUM         Index LA diam:    4.60 cm 2.55 cm/m  AORTIC VALVE             Normals LVOT Vmax:   117.00 cm/s LVOT Vmean:  75.200 cm/s 75 cm/s LVOT VTI:    0.189 m     25.3 cm  AORTA                 Normals Ao Root diam: 2.20 cm 31 mm TRICUSPID VALVE             Normals TR Peak grad:  39.0 mmHg TR Vmax:        333.00 cm/s 288 cm/s  SHUNTS Systemic VTI:  0.19 m Systemic Diam: 1.70 cm  Dani Gobble Croitoru MD Electronically signed by Sanda Klein MD Signature Date/Time: 07/27/2019/11:41:34 AMThe mitral valve is normal in structure.    Final     Labs:  CBC: Recent Labs    07/24/19 0425 07/26/19 0352 07/28/19 0420  07/29/19 0300  WBC 7.8 9.2 8.3 8.9  HGB 8.7* 9.6* 8.0* 7.4*  HCT 28.5* 29.7* 25.8* 23.8*  PLT 226 217 177 180    COAGS: Recent Labs    07/28/19 0420 07/29/19 0532  APTT 125* 196*    BMP: Recent Labs    07/27/19 1600 07/28/19 0420 07/28/19 1644 07/29/19 0300  NA 135 135 136 135  K 4.2 4.4 4.8 4.5  CL 102 101 102 102  CO2 25 26 28 28   GLUCOSE 133* 109* 126* 97  BUN 17 11 9 8   CALCIUM 7.6* 7.8* 8.2* 7.8*  CREATININE 2.21* 1.99* 1.93* 1.74*  GFRNONAA 21* 24* 25* 28*  GFRAA 25* 28* 29* 33*    LIVER FUNCTION TESTS: Recent Labs    05/31/19 1658 07/04/19 1132 07/17/19 0940 07/20/19 0722 07/27/19 1600 07/28/19 0420 07/28/19 1644 07/29/19 0300  BILITOT 1.0 1.1 1.2 1.3*  --   --   --   --   AST 14* 18 12* 14*  --   --   --   --   ALT 10 11 10 9   --   --   --   --   ALKPHOS 71 70 61 64  --   --   --   --   PROT 7.6 7.0 6.7 7.0  --   --   --   --   ALBUMIN 3.8 2.7* 2.3* 2.3* 1.8* 1.7* 1.9* 1.8*    TUMOR MARKERS: No results for input(s): AFPTM, CEA, CA199, CHROMGRNA in the last 8760 hours.  Assessment and Plan:  A/CKD Oliguric CRRT using Rt femoral temp triple lumen cath Progressive failure Needs for ongoing dialysis needs Nephro requesting tunneled dialysis catheter placement Risks and benefits discussed with the patient including, but not limited to bleeding, infection, vascular injury, pneumothorax which may require chest tube placement, air embolism or even death  All of the patient's questions were answered, patient is agreeable to proceed. Consent signed and in chart.   Thank you for this interesting consult.  I greatly enjoyed meeting BERKLIE DETHLEFS and look forward to participating in their care.  A copy of this report was sent to the requesting provider on this date.  Electronically Signed: Lavonia Drafts, PA-C 07/29/2019, 9:50 AM   I spent a total of 20 Minutes    in face to face in clinical consultation, greater than 50% of which was  counseling/coordinating care for tunneled dialysis catheter placement

## 2019-07-30 LAB — RENAL FUNCTION PANEL
Albumin: 1.8 g/dL — ABNORMAL LOW (ref 3.5–5.0)
Anion gap: 10 (ref 5–15)
BUN: 10 mg/dL (ref 8–23)
CO2: 25 mmol/L (ref 22–32)
Calcium: 8.1 mg/dL — ABNORMAL LOW (ref 8.9–10.3)
Chloride: 101 mmol/L (ref 98–111)
Creatinine, Ser: 2.31 mg/dL — ABNORMAL HIGH (ref 0.44–1.00)
GFR calc Af Amer: 23 mL/min — ABNORMAL LOW (ref >60)
GFR calc non Af Amer: 20 mL/min — ABNORMAL LOW (ref >60)
Glucose, Bld: 90 mg/dL (ref 70–99)
Phosphorus: 2.9 mg/dL (ref 2.5–4.6)
Potassium: 4.7 mmol/L (ref 3.5–5.1)
Sodium: 136 mmol/L (ref 135–145)

## 2019-07-30 LAB — CBC
HCT: 25.7 % — ABNORMAL LOW (ref 36.0–46.0)
Hemoglobin: 8.1 g/dL — ABNORMAL LOW (ref 12.0–15.0)
MCH: 28.2 pg (ref 26.0–34.0)
MCHC: 31.5 g/dL (ref 30.0–36.0)
MCV: 89.5 fL (ref 80.0–100.0)
Platelets: 190 10*3/uL (ref 150–400)
RBC: 2.87 MIL/uL — ABNORMAL LOW (ref 3.87–5.11)
RDW: 17.9 % — ABNORMAL HIGH (ref 11.5–15.5)
WBC: 9.3 10*3/uL (ref 4.0–10.5)
nRBC: 0.6 % — ABNORMAL HIGH (ref 0.0–0.2)

## 2019-07-30 LAB — BPAM RBC
Blood Product Expiration Date: 202101242359
ISSUE DATE / TIME: 202012241120
Unit Type and Rh: 5100

## 2019-07-30 LAB — TYPE AND SCREEN
ABO/RH(D): O POS
Antibody Screen: NEGATIVE
Unit division: 0

## 2019-07-30 LAB — GLUCOSE, CAPILLARY
Glucose-Capillary: 94 mg/dL (ref 70–99)
Glucose-Capillary: 89 mg/dL (ref 70–99)
Glucose-Capillary: 97 mg/dL (ref 70–99)
Glucose-Capillary: 145 mg/dL — ABNORMAL HIGH (ref 70–99)

## 2019-07-30 LAB — MAGNESIUM: Magnesium: 2 mg/dL (ref 1.7–2.4)

## 2019-07-30 MED ORDER — SODIUM CHLORIDE 0.9 % IV SOLN
510.0000 mg | Freq: Once | INTRAVENOUS | Status: AC
  Administered 2019-07-30: 510 mg via INTRAVENOUS
  Filled 2019-07-30: qty 17

## 2019-07-30 MED ORDER — HEPARIN SODIUM (PORCINE) 5000 UNIT/ML IJ SOLN: 5000.0000 [IU] | Freq: Two times a day (BID) | INTRAMUSCULAR | Status: DC

## 2019-07-30 MED ORDER — APIXABAN 5 MG PO TABS
5.0000 mg | ORAL_TABLET | Freq: Two times a day (BID) | ORAL | Status: DC
  Administered 2019-07-30 – 2019-08-02 (×8): 5 mg via ORAL
  Filled 2019-07-30 (×8): qty 1

## 2019-07-30 NOTE — Progress Notes (Signed)
NAME:  [name], MRN:  [id number], DOB:  09/30/1945, LOS: 20 ADMISSION DATE:  07/17/2019, CONSULTATION DATE:  07/25/2019 REFERRING MD:  Forestine Na, CHIEF COMPLAINT:  AKI   Brief History    73 yo F with a history of diastolic heart failure, CKD stage IIIb, COPD, chronic respiratory failure, obesity, OSA, Afib (on chronic anticoagulation), GERD, hiatal hernia, gout, DM2, and chronic anemia who presented to [facility] on 12/12 after an episode of coffee ground emesis, nausea and vomiting.  She had an EGD done that showed esophagitis but no active bleeding ulcers.  She was also started on ciprofloaxinc for a UTI.  Unfortunately, throughout her hospital stay, he developed afib with RVR, started on amiodarone, and has had progressive renal failure, and has been anuric the last 24 hours.  She was transferred from [facility] to [facility] for consideration for CRRT.  At the bedside, patient is mentating appropriately and is in no distress.  Denies any significant shortness of breath.    Past Medical History   has a past medical history of Blood transfusion without reported diagnosis, CAD (coronary artery disease), Cataract, Cervical cancer ([place]) (1978), CHF (congestive heart failure) ([place]), Chronic back pain, Chronic kidney disease, COPD (chronic obstructive pulmonary disease) ([place]), Degenerative disc disease, Essential hypertension, benign, GERD (gastroesophageal reflux disease), Gout, Hiatal hernia (07/27/2013), History of diverticulitis of colon, History of hiatal hernia, Iron deficiency anemia, Irritable bowel syndrome, Lumbar radiculopathy, Mixed hyperlipidemia, Moderate major depression, single episode ([place]) (07/21/2019), Neuropathy, Osteoporosis, Ovarian cancer ([place]) (1978), Oxygen deficiency, Sleep apnea, Type 2 diabetes mellitus ([place]), Vitamin B deficiency (12/25/2009), and Vitamin B12 deficiency.   has a past surgical history that includes Esophagogastroduodenoscopy (11/19/2006); Colonoscopy  (10/01/2006); Cholecystectomy; Partial hysterectomy (1978); Tonsillectomy and adenoidectomy; Umbilical hernia repair (2010); Benign breast cysts; Two back surgeries/fusion; Abdominal hysterectomy; Back surgery; Esophagogastroduodenoscopy (10/01/2006); Esophagogastroduodenoscopy (2011); Colonoscopy (2011); small bowel capsule (2008); Esophagogastroduodenoscopy (N/A, 08/06/2013); Givens capsule study (N/A, 08/06/2013); Knee surgery (Right); Colonoscopy (N/A, 01/26/2016); Spine surgery; Esophagogastroduodenoscopy (egd) with propofol (N/A, 07/19/2019); IR Fluoro Guide CV Line Left (07/29/2019); and IR US Guide Vasc Access Left (07/29/2019).   Significant Hospital Events   12/14 EGD: esophagitis, Schatzki ring dilation 12/15 Afib with RVR: started on amiodarone 12/16: AKI on CKD, renal consulted initially 12/22: started on neo gtt early this am 07/28/2019-on neo as of 0400 this am 2/2 hypotension and inability to pull volume with crrt. Unsure if needed to pull volume with pt on baseline oxygen, without pulm edema or peripheral edema. See wt's are up but unsure if accurate. Ordered echo to determine LV/RV function and help guide management.   Consults:  Cardiology Nephrology GI  Procedures:  EGD 2/14  Significant Diagnostic Tests:  12/12 CT A/P without contrast: stable large hiatal hernia with majority of the stomach in the Chest. Increased bibasilar atelectasis. Right middle lobe 5 mm pulmonary nodules remain indeterminate.  Diverticulosis without acute inflammatory process. Improved body anasarca  Micro Data:  COVID-19 12/16--> negative Urine culture 12/12--> enterobacter aerogenes.   Antimicrobials:  Ciprofloxacin 12/17->  Interim history/subjective:  Permcath placed yesterday. Patient up to chair with minimal assistance. No spontaneous urine output.  Objective   Blood pressure (!) 135/51, pulse 83, temperature 98.1 F (36.7 C), temperature source Oral, resp. rate 17, height 4\' 11"   (1.499 m), weight 87.5 kg, SpO2 93 %.        Intake/Output Summary (Last 24 hours) at 07/30/2019 0813 Last data filed at 07/30/2019 0630 Gross per 24 hour  Intake 1164.33 ml  Output 1132 ml  Net 32.33 ml   Filed Weights   07/28/19 0432 07/29/19 0302 07/30/19 0600  Weight: 90.4 kg 88.4 kg 87.5 kg    Examination:  General Appearance: Morbidly obese.  Not intubated not sedated.  Up in chair. Head:  Normocephalic, without obvious abnormality, atraumatic Eyes:  PERRL - yes, conjunctiva/corneas -clear Lungs: Clear to auscultation bilaterally,   Heart:  S1 and S2 normal, no murmur,  Abdomen:  Soft, no masses, no organomegaly Genitalia / Rectal:  Not done Extremities:  No edema. Skin:  intact in exposed areas .  HD catheter site intact. Neurologic:  arousable and follows commands. conversant   Resolved Hospital Problem list   UGIB due to esophagitis Shock.  Assessment & Plan:  73 yo F who presented with UGIB due to esophagitis in the setting of GERD and meloxicam use, hospital course complicated with Afib with RVR, and now with worsening AKI on CKD, transferred [facility] to [facility] for consideration for CRRT.    Chronic hypoxic respiratory insufficiency due to HF and obesity. 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 COPD Gout OSA UTI, enterobacter aerogenes  General improvement as shock is now resolved and patient mentating clearly. Ready for transfer to floor. IHD as per Nephrology. Discharge planning.  Daily Goals Checklist  Pain/Anxiety/Delirium protocol (if indicated): Delirium monitoring only. VAP protocol (if indicated): not intubated. Respiratory support goals: Incentive spirometry. Blood pressure target: MAP>65. Amiodarone to oral today. DVT prophylaxis: heparin tid Nutrition Status: moderate malnutrition due to critical illness. GI prophylaxis: BID  Pantoprazole following UGIB Fluid status goals: Positive fluid balance. Increase fluid removal via CRRT to -172ml/h. Urinary catheter: removed due to anuria Central lines: permcath in place. Temporary line removed. Glucose control: euglycemic Mobility/therapy needs: Ambulate with assistance Antibiotic de-escalation: Complete course of Cipro for UTI -step down to orals Home medication reconciliation: Relevant medications reordered. Daily labs: Daily CBC and renal panel Code Status: Full code  Family Communication: son updated yesterday Disposition: transfer to floor - order reconciliation performed and TRH advised.   LABS    PULMONARY No results for input(s): PHART, PCO2ART, PO2ART, HCO3, TCO2, O2SAT in the last 168 hours.  Invalid input(s): PCO2, PO2  CBC Recent Labs  Lab 07/29/19 0300 07/29/19 1538 07/30/19 0251  HGB 7.4* 8.6* 8.1*  HCT 23.8* 27.1* 25.7*  WBC 8.9 9.5 9.3  PLT 180 178 190    COAGULATION No results for input(s): INR in the last 168 hours.  CARDIAC  No results for input(s): TROPONINI in the last 168 hours. No results for input(s): PROBNP in the last 168 hours.   CHEMISTRY Recent Labs  Lab 07/26/19 0352 07/27/19 0445 07/28/19 0420 07/28/19 1644 07/29/19 0300 07/29/19 1538 07/30/19 0251  NA 133* 134* 135 136 135 133* 136  K 4.6 4.4 4.4 4.8 4.5 4.4 4.7  CL 95* 98 101 102 102 101 101  CO2 25 27 26 28 28 27 25   GLUCOSE 126* 104* 109* 126* 97 104* 90  BUN 64* 24* 11 9 8 8 10   CREATININE 5.54* 2.86* 1.99* 1.93* 1.74* 1.79* 2.31*  CALCIUM 7.5* 7.7* 7.8* 8.2* 7.8* 7.7* 8.1*  MG 1.9 2.0 2.1  --  2.1  --  2.0  PHOS 4.8* 2.7 1.7*  1.6* 1.4* 1.7* 2.1* 2.9   Estimated Creatinine Clearance: 20.5 mL/min (A) (by C-G formula based on SCr of 2.31 mg/dL (H)).   LIVER Recent Labs  Lab 07/28/19 0420 07/28/19 1644 07/29/19 0300 07/29/19 1538 07/30/19 0251  ALBUMIN 1.7* 1.9* 1.8* 1.8* 1.8*     INFECTIOUS No results for input(s): LATICACIDVEN,  PROCALCITON in the last 168 hours.   ENDOCRINE CBG (last 3)  Recent Labs    07/29/19 1536 07/29/19 2214 07/30/19 0654  GLUCAP 92 115* 94    35 min spent with >50% in counseling and coordination of care.   Kipp Brood, MD [facility] ICU Physician [place]  Pager: [phone] Mobile: [phone] After hours: [phone].  07/30/2019, 8:13 AM

## 2019-07-30 NOTE — Plan of Care (Signed)
Stable during shift.   Rested part of night on home BiPAP machine with 2 lpm O2 bled in, pt took off around 0300, sats 88% while sleeping on RA, placed on 2 l n/c with good sats.   Remains in NSR. BP stable. Denies pain. Moves well in bed and able to assist with repositioning. Assisting with ADLs.    Problem: Education: Goal: Knowledge of General Education information will improve Description: Including pain rating scale, medication(s)/side effects and non-pharmacologic comfort measures Outcome: Progressing   Problem: Health Behavior/Discharge Planning: Goal: Ability to manage health-related needs will improve Outcome: Progressing   Problem: Clinical Measurements: Goal: Ability to maintain clinical measurements within normal limits will improve Outcome: Progressing Goal: Will remain free from infection Outcome: Progressing Goal: Diagnostic test results will improve Outcome: Progressing Goal: Respiratory complications will improve Outcome: Progressing Goal: Cardiovascular complication will be avoided Outcome: Progressing   Problem: Activity: Goal: Risk for activity intolerance will decrease Outcome: Progressing   Problem: Nutrition: Goal: Adequate nutrition will be maintained Outcome: Progressing   Problem: Coping: Goal: Level of anxiety will decrease Outcome: Progressing   Problem: Elimination: Goal: Will not experience complications related to bowel motility Outcome: Progressing Goal: Will not experience complications related to urinary retention Outcome: Progressing   Problem: Pain Managment: Goal: General experience of comfort will improve Outcome: Progressing   Problem: Safety: Goal: Ability to remain free from injury will improve Outcome: Progressing   Problem: Skin Integrity: Goal: Risk for impaired skin integrity will decrease Outcome: Progressing   Problem: Education: Goal: Knowledge of General Education information will improve Description:  Including pain rating scale, medication(s)/side effects and non-pharmacologic comfort measures Outcome: Progressing   Problem: Health Behavior/Discharge Planning: Goal: Ability to manage health-related needs will improve Outcome: Progressing   Problem: Clinical Measurements: Goal: Ability to maintain clinical measurements within normal limits will improve Outcome: Progressing Goal: Will remain free from infection Outcome: Progressing Goal: Diagnostic test results will improve Outcome: Progressing Goal: Respiratory complications will improve Outcome: Progressing Goal: Cardiovascular complication will be avoided Outcome: Progressing   Problem: Activity: Goal: Risk for activity intolerance will decrease Outcome: Progressing   Problem: Nutrition: Goal: Adequate nutrition will be maintained Outcome: Progressing   Problem: Coping: Goal: Level of anxiety will decrease Outcome: Progressing   Problem: Elimination: Goal: Will not experience complications related to bowel motility Outcome: Progressing Goal: Will not experience complications related to urinary retention Outcome: Progressing   Problem: Pain Managment: Goal: General experience of comfort will improve Outcome: Progressing   Problem: Safety: Goal: Ability to remain free from injury will improve Outcome: Progressing   Problem: Skin Integrity: Goal: Risk for impaired skin integrity will decrease Outcome: Progressing

## 2019-07-30 NOTE — Progress Notes (Signed)
NEW ADMISSION NOTE New Admission Note: Transferred from [place]  Arrival Method: Patient arrived from [facility] in bed accompanied by the staff. Mental Orientation: Alert and oriented x 4. Telemetry: N/A Assessment: Completed Skin: Warm, dry and intact except for cellulitis R leg and blister rupture on the left leg, bruising noted upper extremities. IV: Right AC Pain: Denies any pain. Tubes: N/A Safety Measures: Safety Fall Prevention Plan has been given, discussed and signed Admission: Completed [facility] [place] Orientation: Patient has been orientated to the room, unit and staff.    Orders have been reviewed and implemented. Will continue to monitor the patient. Call light has been placed within reach and bed alarm has been activated.   Amaryllis Dyke, RN

## 2019-07-30 NOTE — Progress Notes (Signed)
Progress Note  Patient Name: [name] Date of Encounter: 07/30/2019  Primary Cardiologist: [name], MD   Subjective   Feels well, still not making much urine per her report.  Inpatient Medications    Scheduled Meds: . allopurinol  100 mg Oral Daily  . amiodarone  200 mg Oral Daily  . atorvastatin  40 mg Oral QHS  . B-complex with vitamin C  1 tablet Oral Daily  . Chlorhexidine Gluconate Cloth  6 each Topical Daily  . darbepoetin (ARANESP) injection - NON-DIALYSIS  150 mcg Subcutaneous Q Thu-1800  . docusate sodium  100 mg Oral Daily  . feeding supplement (ENSURE ENLIVE)  237 mL Oral BID WC  . feeding supplement (PRO-STAT SUGAR FREE 64)  30 mL Oral BID  . umeclidinium bromide  1 puff Inhalation Daily   And  . fluticasone furoate-vilanterol  1 puff Inhalation Daily  . insulin aspart  0-9 Units Subcutaneous TID WC  . insulin glargine  5 Units Subcutaneous QHS  . metoprolol tartrate  25 mg Oral BID  . pantoprazole  40 mg Oral BID AC  . sertraline  50 mg Oral Daily   Continuous Infusions: . sodium chloride Stopped (07/29/19 1306)  . ciprofloxacin    . ferumoxytol    . phenylephrine (NEO-SYNEPHRINE) Adult infusion Stopped (07/28/19 0343)   PRN Meds: sodium chloride, acetaminophen **OR** acetaminophen, albuterol   Vital Signs    Vitals:   07/30/19 0600 07/30/19 0700 07/30/19 0747 07/30/19 0800  BP: (!) 120/55 (!) 135/51    Pulse: 82 83  75  Resp: 18 17  15   Temp:   98.1 F (36.7 C)   TempSrc:   Oral   SpO2: 92% 93%  94%  Weight: 87.5 kg     Height:        Intake/Output Summary (Last 24 hours) at 07/30/2019 0848 Last data filed at 07/30/2019 0800 Gross per 24 hour  Intake 1284.33 ml  Output 1132 ml  Net 152.33 ml   Last 3 Weights 07/30/2019 07/29/2019 07/28/2019  Weight (lbs) 192 lb 14.4 oz 194 lb 14.2 oz 199 lb 4.7 oz  Weight (kg) 87.5 kg 88.4 kg 90.4 kg      Telemetry    NSR - Personally Reviewed  ECG    No new - Personally  Reviewed  Physical Exam   GEN: No acute distress.   Neck: No JVD Cardiac: RRR, no murmurs, rubs, or gallops.  Respiratory: Clear to auscultation bilaterally. GI: Soft, nontender, non-distended  MS: 1+ puffy edema; No deformity. Neuro:  Nonfocal  Psych: Normal affect   Labs    High Sensitivity Troponin:   Recent Labs  Lab 07/05/19 1753 07/05/19 1955 07/05/19 2157 07/17/19 0940 07/17/19 1042  TROPONINIHS 52* 50* 51* 19* 20*      Chemistry Recent Labs  Lab 07/29/19 0300 07/29/19 1538 07/30/19 0251  NA 135 133* 136  K 4.5 4.4 4.7  CL 102 101 101  CO2 28 27 25   GLUCOSE 97 104* 90  BUN 8 8 10   CREATININE 1.74* 1.79* 2.31*  CALCIUM 7.8* 7.7* 8.1*  ALBUMIN 1.8* 1.8* 1.8*  GFRNONAA 28* 27* 20*  GFRAA 33* 32* 23*  ANIONGAP 5 5 10      Hematology Recent Labs  Lab 07/29/19 0300 07/29/19 1538 07/30/19 0251  WBC 8.9 9.5 9.3  RBC 2.71* 3.05* 2.87*  HGB 7.4* 8.6* 8.1*  HCT 23.8* 27.1* 25.7*  MCV 87.8 88.9 89.5  MCH 27.3 28.2 28.2  MCHC 31.1  31.7 31.5  RDW 18.4* 17.7* 17.9*  PLT 180 178 190    BNPNo results for input(s): BNP, PROBNP in the last 168 hours.   DDimer No results for input(s): DDIMER in the last 168 hours.   Radiology    IR Fluoro Guide CV Line Left  Result Date: 07/29/2019 CLINICAL DATA:  Renal failure, needs durable venous access for hemodialysis. Existing right port catheter EXAM: TUNNELED HEMODIALYSIS CATHETER PLACEMENT WITH ULTRASOUND AND FLUOROSCOPIC GUIDANCE TECHNIQUE: The procedure, risks, benefits, and alternatives were explained to the patient. Questions regarding the procedure were encouraged and answered. The patient understands and consents to the procedure. As antibiotic prophylaxis, cefazolin 2 g was ordered pre-procedure and administered intravenously within one hour of incision. Patency of the left IJ vein was confirmed with ultrasound with image documentation. An appropriate skin site was determined. Region was prepped using maximum  barrier technique including cap and mask, sterile gown, sterile gloves, large sterile sheet, and Chlorhexidine as cutaneous antisepsis. The region was infiltrated locally with 1% lidocaine. Intravenous Fentanyl 15mcg and Versed 1mg  were administered as conscious sedation during continuous monitoring of the patient's level of consciousness and physiological / cardiorespiratory status by the radiology RN, with a total moderate sedation time of 13 minutes. Under real-time ultrasound guidance, the left IJ vein was accessed with a 21 gauge micropuncture needle; the needle tip within the vein was confirmed with ultrasound image documentation. Needle exchanged over the 018 guidewire for transitional dilator, which allowed advancement of a Benson wire into the IVC. Over this, an MPA catheter was advanced. A Palindrome 23 hemodialysis catheter was tunneled from the left anterior chest wall approach to the IJ dermatotomy site. The MPA catheter was exchanged over an Amplatz wire for serial vascular dilators which allow placement of a peel-away sheath, through which the catheter was advanced under intermittent fluoroscopy, positioned with its tips in the proximal and midright atrium. Spot chest radiograph confirms good catheter position. No pneumothorax. Catheter was flushed and primed per protocol. Catheter secured externally with O Prolene sutures. The left IJ dermatotomy site was closed with Dermabond. COMPLICATIONS: COMPLICATIONS None immediate FLUOROSCOPY TIME:  1.1 minutes; 149  uGym2 DAP IMPRESSION: 1. Technically successful placement of tunneled left IJ hemodialysis catheter with ultrasound and fluoroscopic guidance. Ready for routine use. ACCESS: Remains approachable for percutaneous intervention as needed. Electronically Signed   By: Lucrezia Europe M.D.   On: 07/29/2019 15:37   IR US Guide Vasc Access Left  Result Date: 07/29/2019 CLINICAL DATA:  Renal failure, needs durable venous access for hemodialysis. Existing  right port catheter EXAM: TUNNELED HEMODIALYSIS CATHETER PLACEMENT WITH ULTRASOUND AND FLUOROSCOPIC GUIDANCE TECHNIQUE: The procedure, risks, benefits, and alternatives were explained to the patient. Questions regarding the procedure were encouraged and answered. The patient understands and consents to the procedure. As antibiotic prophylaxis, cefazolin 2 g was ordered pre-procedure and administered intravenously within one hour of incision. Patency of the left IJ vein was confirmed with ultrasound with image documentation. An appropriate skin site was determined. Region was prepped using maximum barrier technique including cap and mask, sterile gown, sterile gloves, large sterile sheet, and Chlorhexidine as cutaneous antisepsis. The region was infiltrated locally with 1% lidocaine. Intravenous Fentanyl 58mcg and Versed 1mg  were administered as conscious sedation during continuous monitoring of the patient's level of consciousness and physiological / cardiorespiratory status by the radiology RN, with a total moderate sedation time of 13 minutes. Under real-time ultrasound guidance, the left IJ vein was accessed with a 21 gauge micropuncture needle;  the needle tip within the vein was confirmed with ultrasound image documentation. Needle exchanged over the 018 guidewire for transitional dilator, which allowed advancement of a Benson wire into the IVC. Over this, an MPA catheter was advanced. A Palindrome 23 hemodialysis catheter was tunneled from the left anterior chest wall approach to the IJ dermatotomy site. The MPA catheter was exchanged over an Amplatz wire for serial vascular dilators which allow placement of a peel-away sheath, through which the catheter was advanced under intermittent fluoroscopy, positioned with its tips in the proximal and midright atrium. Spot chest radiograph confirms good catheter position. No pneumothorax. Catheter was flushed and primed per protocol. Catheter secured externally with O  Prolene sutures. The left IJ dermatotomy site was closed with Dermabond. COMPLICATIONS: COMPLICATIONS None immediate FLUOROSCOPY TIME:  1.1 minutes; 149  uGym2 DAP IMPRESSION: 1. Technically successful placement of tunneled left IJ hemodialysis catheter with ultrasound and fluoroscopic guidance. Ready for routine use. ACCESS: Remains approachable for percutaneous intervention as needed. Electronically Signed   By: Lucrezia Europe M.D.   On: 07/29/2019 15:37    Cardiac Studies   Echo:  1. Left ventricular ejection fraction, by visual estimation, is 55 to 60%. The left ventricle has normal function. There is mildly increased left ventricular hypertrophy. 2. Left ventricular diastolic function could not be evaluated. 3. The left ventricle has no regional wall motion abnormalities. 4. Global right ventricle has normal systolic function.The right ventricular size is normal. No increase in right ventricular wall thickness. 5. Left atrial size was moderately dilated. 6. Right atrial size was mildly dilated. 7. Mild mitral annular calcification. 8. The mitral valve is degenerative. Trivial mitral valve regurgitation. 9. The tricuspid valve is grossly normal. Tricuspid valve regurgitation moderate. 10. The tricuspid valve was normal in structure. Tricuspid valve regurgitation moderate. 11. The aortic valve is grossly normal. Aortic valve regurgitation is not visualized. 12. Moderately elevated pulmonary artery systolic pressure. 13. The tricuspid regurgitant velocity is 3.12 m/s, and with an assumed right atrial pressure of 15 mmHg, the estimated right ventricular systolic pressure is moderately elevated at 54.0 mmHg. 14. The inferior vena cava is dilated in size with <50% respiratory variability, suggesting right atrial pressure of 15 mmHg.  Patient Profile   73 y.o. female with past medical history of chronic diastolic CHF, CAD (nonobstructive CAD by cath in 2000), HTN, HLD, Stage4CKD , COPD, and  OSA (on CPAP) presenting to the hospital after recent coffee-ground emesis with EGD showing mild erosive esophagitis and Schatzki ring requiring dilatation. She subsequently developedatrial fibrillationwith RVR requiring further management.  Assessment & Plan    Afib RVR - in NSR, feeling well. On PO amiodarone 200 mg, continue at this time. She knows when she in afib.  Chronic diastolic HF - remains oliguric, transitioning to IHD. Volume management by dialysis.   AKI - Cr increased, volume. management by dialysis.       For questions or updates, please contact [place] Please consult [email or web address] for contact info under        Signed, Elouise Munroe, MD  07/30/2019, 8:48 AM

## 2019-07-30 NOTE — Progress Notes (Signed)
Subjective:  CRRT stopped late yesterday -  Had a good night, slept well, up eating breakfast.  No UOP  Objective Vital signs in last 24 hours: Vitals:   07/30/19 0346 07/30/19 0400 07/30/19 0500 07/30/19 0600  BP:  (!) 118/42 (!) 115/54 (!) 120/55  Pulse:  79 77 82  Resp:  18 17 18   Temp: 98.4 F (36.9 C)     TempSrc: Oral     SpO2:  93% 90% 92%  Weight:    87.5 kg  Height:       Weight change: -0.9 kg  Intake/Output Summary (Last 24 hours) at 07/30/2019 0700 Last data filed at 07/30/2019 0630 Gross per 24 hour  Intake 1390.87 ml  Output 1268 ml  Net 122.87 ml    Assessment/ Plan: Pt is a 73 y.o. yo female with COPD and diastolic heart failure, CKD with baseline crt around 3 who was admitted on 07/17/2019 to [facility] had Afib refractory to tx-  Developed A on CRF and GIB-  Transferred to [facility] on 12/20 for initiation of CRRT  Assessment/Plan: 1. Renal-  Baseline CKD- of late crt around 3.  Developed A on CRF in the setting of Afib with hemodynamic instability.  On CRRT from 12/20- 12/24.  Unfortunately no UOP  crt from 1.8 to 2.3 off of CRRT.  Now has a TDC.  No indications for RRT today, will watch and do IHD PRN.  Unclear if dialysis dependence will be permanent at this point  2. HTN/vol-  Volume better after several days of CRRT - amio and lopressor-  No change  3. A fib-   Now NSR 4. Anemia- CKD and GIB (egd showed esophagitis)- on ESA-  hgb stable in the 8's - on protonix as well.  Iron is low, got dose on 12/18 will give another   5. ID-  UTI on cipro  Tesslyn Baumert A Rosaly Labarbera    Labs: Basic Metabolic Panel: Recent Labs  Lab 07/29/19 0300 07/29/19 1538 07/30/19 0251  NA 135 133* 136  K 4.5 4.4 4.7  CL 102 101 101  CO2 28 27 25   GLUCOSE 97 104* 90  BUN 8 8 10   CREATININE 1.74* 1.79* 2.31*  CALCIUM 7.8* 7.7* 8.1*  PHOS 1.7* 2.1* 2.9   Liver Function Tests: Recent Labs  Lab 07/29/19 0300 07/29/19 1538 07/30/19 0251  ALBUMIN 1.8* 1.8* 1.8*   No results for  input(s): LIPASE, AMYLASE in the last 168 hours. No results for input(s): AMMONIA in the last 168 hours. CBC: Recent Labs  Lab 07/26/19 0352 07/28/19 0420 07/29/19 0300 07/29/19 1538 07/30/19 0251  WBC 9.2 8.3 8.9 9.5 9.3  HGB 9.6* 8.0* 7.4* 8.6* 8.1*  HCT 29.7* 25.8* 23.8* 27.1* 25.7*  MCV 87.4 87.5 87.8 88.9 89.5  PLT 217 177 180 178 190   Cardiac Enzymes: No results for input(s): CKTOTAL, CKMB, CKMBINDEX, TROPONINI in the last 168 hours. CBG: Recent Labs  Lab 07/29/19 0644 07/29/19 1126 07/29/19 1536 07/29/19 2214 07/30/19 0654  GLUCAP 88 109* 92 115* 94    Iron Studies: No results for input(s): IRON, TIBC, TRANSFERRIN, FERRITIN in the last 72 hours. Studies/Results: IR Fluoro Guide CV Line Left  Result Date: 07/29/2019 CLINICAL DATA:  Renal failure, needs durable venous access for hemodialysis. Existing right port catheter EXAM: TUNNELED HEMODIALYSIS CATHETER PLACEMENT WITH ULTRASOUND AND FLUOROSCOPIC GUIDANCE TECHNIQUE: The procedure, risks, benefits, and alternatives were explained to the patient. Questions regarding the procedure were encouraged and answered. The patient understands and consents  to the procedure. As antibiotic prophylaxis, cefazolin 2 g was ordered pre-procedure and administered intravenously within one hour of incision. Patency of the left IJ vein was confirmed with ultrasound with image documentation. An appropriate skin site was determined. Region was prepped using maximum barrier technique including cap and mask, sterile gown, sterile gloves, large sterile sheet, and Chlorhexidine as cutaneous antisepsis. The region was infiltrated locally with 1% lidocaine. Intravenous Fentanyl 97mcg and Versed 1mg  were administered as conscious sedation during continuous monitoring of the patient's level of consciousness and physiological / cardiorespiratory status by the radiology RN, with a total moderate sedation time of 13 minutes. Under real-time ultrasound  guidance, the left IJ vein was accessed with a 21 gauge micropuncture needle; the needle tip within the vein was confirmed with ultrasound image documentation. Needle exchanged over the 018 guidewire for transitional dilator, which allowed advancement of a Benson wire into the IVC. Over this, an MPA catheter was advanced. A Palindrome 23 hemodialysis catheter was tunneled from the left anterior chest wall approach to the IJ dermatotomy site. The MPA catheter was exchanged over an Amplatz wire for serial vascular dilators which allow placement of a peel-away sheath, through which the catheter was advanced under intermittent fluoroscopy, positioned with its tips in the proximal and midright atrium. Spot chest radiograph confirms good catheter position. No pneumothorax. Catheter was flushed and primed per protocol. Catheter secured externally with O Prolene sutures. The left IJ dermatotomy site was closed with Dermabond. COMPLICATIONS: COMPLICATIONS None immediate FLUOROSCOPY TIME:  1.1 minutes; 149  uGym2 DAP IMPRESSION: 1. Technically successful placement of tunneled left IJ hemodialysis catheter with ultrasound and fluoroscopic guidance. Ready for routine use. ACCESS: Remains approachable for percutaneous intervention as needed. Electronically Signed   By: Lucrezia Europe M.D.   On: 07/29/2019 15:37   IR US Guide Vasc Access Left  Result Date: 07/29/2019 CLINICAL DATA:  Renal failure, needs durable venous access for hemodialysis. Existing right port catheter EXAM: TUNNELED HEMODIALYSIS CATHETER PLACEMENT WITH ULTRASOUND AND FLUOROSCOPIC GUIDANCE TECHNIQUE: The procedure, risks, benefits, and alternatives were explained to the patient. Questions regarding the procedure were encouraged and answered. The patient understands and consents to the procedure. As antibiotic prophylaxis, cefazolin 2 g was ordered pre-procedure and administered intravenously within one hour of incision. Patency of the left IJ vein was  confirmed with ultrasound with image documentation. An appropriate skin site was determined. Region was prepped using maximum barrier technique including cap and mask, sterile gown, sterile gloves, large sterile sheet, and Chlorhexidine as cutaneous antisepsis. The region was infiltrated locally with 1% lidocaine. Intravenous Fentanyl 14mcg and Versed 1mg  were administered as conscious sedation during continuous monitoring of the patient's level of consciousness and physiological / cardiorespiratory status by the radiology RN, with a total moderate sedation time of 13 minutes. Under real-time ultrasound guidance, the left IJ vein was accessed with a 21 gauge micropuncture needle; the needle tip within the vein was confirmed with ultrasound image documentation. Needle exchanged over the 018 guidewire for transitional dilator, which allowed advancement of a Benson wire into the IVC. Over this, an MPA catheter was advanced. A Palindrome 23 hemodialysis catheter was tunneled from the left anterior chest wall approach to the IJ dermatotomy site. The MPA catheter was exchanged over an Amplatz wire for serial vascular dilators which allow placement of a peel-away sheath, through which the catheter was advanced under intermittent fluoroscopy, positioned with its tips in the proximal and midright atrium. Spot chest radiograph confirms good catheter position. No pneumothorax.  Catheter was flushed and primed per protocol. Catheter secured externally with O Prolene sutures. The left IJ dermatotomy site was closed with Dermabond. COMPLICATIONS: COMPLICATIONS None immediate FLUOROSCOPY TIME:  1.1 minutes; 149  uGym2 DAP IMPRESSION: 1. Technically successful placement of tunneled left IJ hemodialysis catheter with ultrasound and fluoroscopic guidance. Ready for routine use. ACCESS: Remains approachable for percutaneous intervention as needed. Electronically Signed   By: Lucrezia Europe M.D.   On: 07/29/2019 15:37    Medications: Infusions: .  prismasol BGK 4/2.5 Stopped (07/29/19 1654)  .  prismasol BGK 4/2.5 Stopped (07/29/19 1654)  . sodium chloride Stopped (07/29/19 1306)  . ciprofloxacin    . heparin 10,000 units/ 20 mL infusion syringe Stopped (07/29/19 1654)  . phenylephrine (NEO-SYNEPHRINE) Adult infusion Stopped (07/28/19 0343)  . prismasol BGK 4/2.5 Stopped (07/29/19 1654)    Scheduled Medications: . allopurinol  100 mg Oral Daily  . amiodarone  200 mg Oral Daily  . atorvastatin  40 mg Oral QHS  . B-complex with vitamin C  1 tablet Oral Daily  . Chlorhexidine Gluconate Cloth  6 each Topical Daily  . darbepoetin (ARANESP) injection - NON-DIALYSIS  150 mcg Subcutaneous Q Thu-1800  . docusate sodium  100 mg Oral Daily  . feeding supplement (ENSURE ENLIVE)  237 mL Oral BID WC  . feeding supplement (PRO-STAT SUGAR FREE 64)  30 mL Oral BID  . umeclidinium bromide  1 puff Inhalation Daily   And  . fluticasone furoate-vilanterol  1 puff Inhalation Daily  . insulin aspart  0-9 Units Subcutaneous TID WC  . insulin glargine  5 Units Subcutaneous QHS  . metoprolol tartrate  25 mg Oral BID  . pantoprazole  40 mg Oral BID AC  . sertraline  50 mg Oral Daily    have reviewed scheduled and prn medications.  Physical Exam: General: sitting up at bedside, looks good, no complaints Heart: RRR Lungs: mostly clear Abdomen: soft, non tender Extremities: obese, difficult to tell but does not seem to have edema Dialysis Access: tunneled HD cath  On left    07/30/2019,7:00 AM  LOS: 12 days

## 2019-07-30 NOTE — Progress Notes (Addendum)
PCCM pick up for triad hospitalist on  12/26.  73 yo wh o presented to [facility] with UGIB due to esophagitis in setting of GERD and meloxicam use. Hospital course complicated by afib with rvr and then worsening AKI on ckd and transferred to [facility] for [facility]RT. Now on intermittent HD for volume management, afib controlled on amiodarone.   dispo likely snf short term or long term snf.    [name], NP

## 2019-07-31 LAB — GLUCOSE, CAPILLARY
Glucose-Capillary: 108 mg/dL — ABNORMAL HIGH (ref 70–99)
Glucose-Capillary: 89 mg/dL (ref 70–99)
Glucose-Capillary: 101 mg/dL — ABNORMAL HIGH (ref 70–99)
Glucose-Capillary: 106 mg/dL — ABNORMAL HIGH (ref 70–99)

## 2019-07-31 LAB — CBC
HCT: 25.6 % — ABNORMAL LOW (ref 36.0–46.0)
Hemoglobin: 8 g/dL — ABNORMAL LOW (ref 12.0–15.0)
MCH: 28.3 pg (ref 26.0–34.0)
MCHC: 31.3 g/dL (ref 30.0–36.0)
MCV: 90.5 fL (ref 80.0–100.0)
Platelets: 178 10*3/uL (ref 150–400)
RBC: 2.83 MIL/uL — ABNORMAL LOW (ref 3.87–5.11)
RDW: 18.6 % — ABNORMAL HIGH (ref 11.5–15.5)
WBC: 8.8 10*3/uL (ref 4.0–10.5)
nRBC: 0.5 % — ABNORMAL HIGH (ref 0.0–0.2)

## 2019-07-31 LAB — MAGNESIUM: Magnesium: 2.1 mg/dL (ref 1.7–2.4)

## 2019-07-31 MED ORDER — CHLORHEXIDINE GLUCONATE CLOTH 2 % EX PADS: 6.0000 | MEDICATED_PAD | Freq: Every day | CUTANEOUS | Status: DC

## 2019-07-31 NOTE — Plan of Care (Signed)
  Problem: Activity: Goal: Risk for activity intolerance will decrease Outcome: Progressing   

## 2019-07-31 NOTE — Progress Notes (Signed)
Progress Note  Patient Name: [name] Date of Encounter: 07/31/2019  Primary Cardiologist:   [name], MD   Subjective   Denies chest pain.  No SOB.   Inpatient Medications    Scheduled Meds: . allopurinol  100 mg Oral Daily  . amiodarone  200 mg Oral Daily  . apixaban  5 mg Oral BID  . atorvastatin  40 mg Oral QHS  . B-complex with vitamin C  1 tablet Oral Daily  . Chlorhexidine Gluconate Cloth  6 each Topical Daily  . darbepoetin (ARANESP) injection - NON-DIALYSIS  150 mcg Subcutaneous Q Thu-1800  . docusate sodium  100 mg Oral Daily  . feeding supplement (ENSURE ENLIVE)  237 mL Oral BID WC  . feeding supplement (PRO-STAT SUGAR FREE 64)  30 mL Oral BID  . umeclidinium bromide  1 puff Inhalation Daily   And  . fluticasone furoate-vilanterol  1 puff Inhalation Daily  . insulin aspart  0-9 Units Subcutaneous TID WC  . insulin glargine  5 Units Subcutaneous QHS  . metoprolol tartrate  25 mg Oral BID  . pantoprazole  40 mg Oral BID AC  . sertraline  50 mg Oral Daily   Continuous Infusions: . sodium chloride Stopped (07/29/19 1306)  . phenylephrine (NEO-SYNEPHRINE) Adult infusion Stopped (07/28/19 0343)   PRN Meds: sodium chloride, acetaminophen **OR** acetaminophen, albuterol   Vital Signs    Vitals:   07/30/19 1822 07/30/19 2112 07/31/19 0454 07/31/19 1008  BP: (!) 120/51 (!) 123/50 (!) 145/43 (!) 137/53  Pulse: 72 79 81 77  Resp: 18 16 19 18   Temp: 98.4 F (36.9 C) 98.3 F (36.8 C) 97.8 F (36.6 C) 97.7 F (36.5 C)  TempSrc:  Oral Oral   SpO2: 96% 93% 93% 94%  Weight:  89.7 kg    Height:        Intake/Output Summary (Last 24 hours) at 07/31/2019 1055 Last data filed at 07/31/2019 0600 Gross per 24 hour  Intake 180 ml  Output 0 ml  Net 180 ml   Filed Weights   07/30/19 0600 07/30/19 1129 07/30/19 2112  Weight: 87.5 kg 89.7 kg 89.7 kg    Telemetry    NA - Personally Reviewed  ECG    NA - Personally Reviewed  Physical  Exam   GEN: No  acute distress.   Neck: No  JVD Cardiac: RRR, soft apical systolic murmurs, rubs, or gallops.  Respiratory: Clear   to auscultation bilaterally. GI: Soft, nontender, non-distended, normal bowel sounds  MS:  No edema; No deformity. Neuro:   Nonfocal  Psych: Oriented and appropriate     Labs    Chemistry Recent Labs  Lab 07/29/19 1538 07/30/19 0251 07/31/19 0338  NA 133* 136 138  K 4.4 4.7 5.0  CL 101 101 104  CO2 27 25 26   GLUCOSE 104* 90 94  BUN 8 10 20   CREATININE 1.79* 2.31* 3.72*  CALCIUM 7.7* 8.1* 8.1*  ALBUMIN 1.8* 1.8* 1.7*  GFRNONAA 27* 20* 11*  GFRAA 32* 23* 13*  ANIONGAP 5 10 8      Hematology Recent Labs  Lab 07/29/19 1538 07/30/19 0251 07/31/19 0338  WBC 9.5 9.3 8.8  RBC 3.05* 2.87* 2.83*  HGB 8.6* 8.1* 8.0*  HCT 27.1* 25.7* 25.6*  MCV 88.9 89.5 90.5  MCH 28.2 28.2 28.3  MCHC 31.7 31.5 31.3  RDW 17.7* 17.9* 18.6*  PLT 178 190 178    Cardiac EnzymesNo results for input(s): TROPONINI in the last 168  hours. No results for input(s): TROPIPOC in the last 168 hours.   BNPNo results for input(s): BNP, PROBNP in the last 168 hours.   DDimer No results for input(s): DDIMER in the last 168 hours.   Radiology    IR Fluoro Guide CV Line Left  Result Date: 07/29/2019 CLINICAL DATA:  Renal failure, needs durable venous access for hemodialysis. Existing right port catheter EXAM: TUNNELED HEMODIALYSIS CATHETER PLACEMENT WITH ULTRASOUND AND FLUOROSCOPIC GUIDANCE TECHNIQUE: The procedure, risks, benefits, and alternatives were explained to the patient. Questions regarding the procedure were encouraged and answered. The patient understands and consents to the procedure. As antibiotic prophylaxis, cefazolin 2 g was ordered pre-procedure and administered intravenously within one hour of incision. Patency of the left IJ vein was confirmed with ultrasound with image documentation. An appropriate skin site was determined. Region was prepped using  maximum barrier technique including cap and mask, sterile gown, sterile gloves, large sterile sheet, and Chlorhexidine as cutaneous antisepsis. The region was infiltrated locally with 1% lidocaine. Intravenous Fentanyl 67mcg and Versed 1mg  were administered as conscious sedation during continuous monitoring of the patient's level of consciousness and physiological / cardiorespiratory status by the radiology RN, with a total moderate sedation time of 13 minutes. Under real-time ultrasound guidance, the left IJ vein was accessed with a 21 gauge micropuncture needle; the needle tip within the vein was confirmed with ultrasound image documentation. Needle exchanged over the 018 guidewire for transitional dilator, which allowed advancement of a Benson wire into the IVC. Over this, an MPA catheter was advanced. A Palindrome 23 hemodialysis catheter was tunneled from the left anterior chest wall approach to the IJ dermatotomy site. The MPA catheter was exchanged over an Amplatz wire for serial vascular dilators which allow placement of a peel-away sheath, through which the catheter was advanced under intermittent fluoroscopy, positioned with its tips in the proximal and midright atrium. Spot chest radiograph confirms good catheter position. No pneumothorax. Catheter was flushed and primed per protocol. Catheter secured externally with O Prolene sutures. The left IJ dermatotomy site was closed with Dermabond. COMPLICATIONS: COMPLICATIONS None immediate FLUOROSCOPY TIME:  1.1 minutes; 149  uGym2 DAP IMPRESSION: 1. Technically successful placement of tunneled left IJ hemodialysis catheter with ultrasound and fluoroscopic guidance. Ready for routine use. ACCESS: Remains approachable for percutaneous intervention as needed. Electronically Signed   By: Lucrezia Europe M.D.   On: 07/29/2019 15:37   IR US Guide Vasc Access Left  Result Date: 07/29/2019 CLINICAL DATA:  Renal failure, needs durable venous access for hemodialysis.  Existing right port catheter EXAM: TUNNELED HEMODIALYSIS CATHETER PLACEMENT WITH ULTRASOUND AND FLUOROSCOPIC GUIDANCE TECHNIQUE: The procedure, risks, benefits, and alternatives were explained to the patient. Questions regarding the procedure were encouraged and answered. The patient understands and consents to the procedure. As antibiotic prophylaxis, cefazolin 2 g was ordered pre-procedure and administered intravenously within one hour of incision. Patency of the left IJ vein was confirmed with ultrasound with image documentation. An appropriate skin site was determined. Region was prepped using maximum barrier technique including cap and mask, sterile gown, sterile gloves, large sterile sheet, and Chlorhexidine as cutaneous antisepsis. The region was infiltrated locally with 1% lidocaine. Intravenous Fentanyl 28mcg and Versed 1mg  were administered as conscious sedation during continuous monitoring of the patient's level of consciousness and physiological / cardiorespiratory status by the radiology RN, with a total moderate sedation time of 13 minutes. Under real-time ultrasound guidance, the left IJ vein was accessed with a 21 gauge micropuncture needle; the needle  tip within the vein was confirmed with ultrasound image documentation. Needle exchanged over the 018 guidewire for transitional dilator, which allowed advancement of a Benson wire into the IVC. Over this, an MPA catheter was advanced. A Palindrome 23 hemodialysis catheter was tunneled from the left anterior chest wall approach to the IJ dermatotomy site. The MPA catheter was exchanged over an Amplatz wire for serial vascular dilators which allow placement of a peel-away sheath, through which the catheter was advanced under intermittent fluoroscopy, positioned with its tips in the proximal and midright atrium. Spot chest radiograph confirms good catheter position. No pneumothorax. Catheter was flushed and primed per protocol. Catheter secured externally  with O Prolene sutures. The left IJ dermatotomy site was closed with Dermabond. COMPLICATIONS: COMPLICATIONS None immediate FLUOROSCOPY TIME:  1.1 minutes; 149  uGym2 DAP IMPRESSION: 1. Technically successful placement of tunneled left IJ hemodialysis catheter with ultrasound and fluoroscopic guidance. Ready for routine use. ACCESS: Remains approachable for percutaneous intervention as needed. Electronically Signed   By: Lucrezia Europe M.D.   On: 07/29/2019 15:37    Cardiac Studies   Echo:  1. Left ventricular ejection fraction, by visual estimation, is 55 to 60%. The left ventricle has normal function. There is mildly increased left ventricular hypertrophy. 2. Left ventricular diastolic function could not be evaluated. 3. The left ventricle has no regional wall motion abnormalities. 4. Global right ventricle has normal systolic function.The right ventricular size is normal. No increase in right ventricular wall thickness. 5. Left atrial size was moderately dilated. 6. Right atrial size was mildly dilated. 7. Mild mitral annular calcification. 8. The mitral valve is degenerative. Trivial mitral valve regurgitation. 9. The tricuspid valve is grossly normal. Tricuspid valve regurgitation moderate. 10. The tricuspid valve was normal in structure. Tricuspid valve regurgitation moderate. 11. The aortic valve is grossly normal. Aortic valve regurgitation is not visualized. 12. Moderately elevated pulmonary artery systolic pressure. 13. The tricuspid regurgitant velocity is 3.12 m/s, and with an assumed right atrial pressure of 15 mmHg, the estimated right ventricular systolic pressure is moderately elevated at 54.0 mmHg. 14. The inferior vena cava is dilated in size with <50% respiratory variability, suggesting right atrial pressure of 15 mmHg.  Patient Profile     73 y.o. female with past medical history of chronic diastolic CHF, CAD (nonobstructive CAD by cath in 2000), HTN, HLD, Stage 4 CKD ,  COPD, and OSA (on CPAP) presenting to the hospital after recent coffee-ground emesis with EGD showing mild erosive esophagitis and Schatzki ring requiring dilatation. She subsequently developed atrial fibrillation with RVR requiring further management.  Assessment & Plan    ATRIAL FIB WITH RVR:    On PO amiodarone and back on Eliquis restarted yesterday by CCM  We will need to follow Hgb closely and discontinue if she shows signs of active bleeding.     CHRONIC DIASTOLIC HF:  Volume management per dialysis.    We will continue to follow as needed.   For questions or updates, please contact [place] Please consult [email or web address] for contact info under Cardiology/STEMI.   Signed, Minus Breeding, MD  07/31/2019, 10:55 AM

## 2019-07-31 NOTE — Progress Notes (Signed)
Pt has her home BIPAP Trilogy in AVAPS mode. Set Vt of 350 EPAP 8. Pt can place herself on without difficulty when ready to go to sleep. Pt respiratory status is stable at this time. RT will continue to monitor.

## 2019-07-31 NOTE — Progress Notes (Signed)
PROGRESS NOTE    [name]   MOL:[id number]  DOB: [birth year]  DOA: 07/17/2019 PCP: [name], MD   Brief Narrative:  [name] 73 yo F with a history of diastolic heart failure, CKD stage IIIb, COPD, chronic respiratory failure, obesity, OSA, Afib (on chronic anticoagulation), GERD, hiatal hernia, gout, DM2, and chronic anemia who presented to [facility] on 12/12 after an episode of coffee ground emesis, nausea and vomiting.  She had an EGD that showed esophagitis but no active bleeding ulcers.  She was also started on ciprofloaxin for a UTI.  During her hospital stay, she developed Afib with RVR, was started on amiodarone, and has developed progressive renal failure with anuria.  She was transferred from [facility] to [facility] on 12/20 for consideration for CRRT.  12/14 EGD: esophagitis, Schatzki ring dilation 12/15 Afib with RVR: started on amiodarone 12/16: AKI on CKD, renal consulted initially 12/22: started on neo gtt early this am 07/28/2019-on neo as of 0400 this am 2/2 hypotension and inability to pull volume with crrt. Unsure if needed to pull volume with pt on baseline oxygen, without pulm edema or peripheral edema. See wt's are up but unsure if accurate. Ordered echo to determine LV/RV function and help guide management.   Subjective: She has no complaints.    Assessment & Plan:   Principal Problem:   Coffee ground emesis-  Hiatal hernia with GERD and esophagitis - EGD 12/19> -Mild erosive reflux esophagitis. - Mild Schatzki                            ring. -Dilated. Large hiatal hernia. Erythematous                            gastric mucosa.                           - Normal duodenal bulb and second portion of the                            duodenum.                           - No specimens collected. - recommendations > Protonix BID, Eliquis be resumed on 12/20  Active Problems: A-fib with RVR - cardiology following - on Lopressor, 25 mg BID,  Amiodarone 200 mg and Eliquis    OSA on CPAP - using her CPAP from home  Acute renal failure superimposed on stage 4 chronic kidney disease  Chronic diastolic CHF (congestive heart failure)   - TDC placed and dialysis started- remains anuric with rising Cr   HLD (hyperlipidemia) - cont Lipitor  Type 2 diabetes with nephropathy  - on Lantus and Novolog SSI- sugars well controlled in 80-110 range  UTI - treated with Ceftriaxone    Obesity, Class III, BMI 40-49.9 (morbid obesity)  Body mass index is 39.94 kg/m.    Chronic respiratory failure with hypoxia -   COPD (chronic obstructive pulmonary disease)  - cont Nebs and inhalers    Anemia of chronic disease - cont Anarnesp    Time spent in minutes: 35 DVT prophylaxis: Eliquis Code Status: Full code Family Communication:  Disposition Plan: cont dialysis per nephro- PT recommend SNF vs 24 hr supervision Consultants:  Cardiology Nephrology GI Procedures:  EGD 2/14 07/25/19> Rt femoral triple lumen catheter  07/29/19> L IR tunneled HD cath  2 D ECHO 1. Left ventricular ejection fraction, by visual estimation, is 55 to 60%. The left ventricle has normal function. There is mildly increased left ventricular hypertrophy. 2. Left ventricular diastolic function could not be evaluated. 3. The left ventricle has no regional wall motion abnormalities. 4. Global right ventricle has normal systolic function.The right ventricular size is normal. No increase in right ventricular wall thickness. 5. Left atrial size was moderately dilated. 6. Right atrial size was mildly dilated. 7. Mild mitral annular calcification. 8. The mitral valve is degenerative. Trivial mitral valve regurgitation. 9. The tricuspid valve is grossly normal. Tricuspid valve regurgitation moderate. 10. The tricuspid valve was normal in structure. Tricuspid valve regurgitation moderate. 11. The aortic valve is grossly normal. Aortic valve regurgitation is not  visualized. 12. Moderately elevated pulmonary artery systolic pressure. 13. The tricuspid regurgitant velocity is 3.12 m/s, and with an assumed right atrial pressure of 15 mmHg, the estimated right ventricular systolic pressure is moderately elevated at 54.0 mmHg. 14. The inferior vena cava is dilated in size with <50% respiratory variability, suggesting right atrial pressure of 15 mmHg. Antimicrobials:  Anti-infectives (From admission, onward)   Start     Dose/Rate Route Frequency Ordered Stop   07/30/19 1015  ciprofloxacin (CIPRO) IVPB 400 mg     400 mg 200 mL/hr over 60 Minutes Intravenous Every 24 hours 07/29/19 1404 07/30/19 1101   07/29/19 1030  ceFAZolin (ANCEF) IVPB 2g/100 mL premix     2 g 200 mL/hr over 30 Minutes Intravenous To Radiology 07/29/19 1004 07/29/19 1531   07/25/19 2200  ciprofloxacin (CIPRO) IVPB 400 mg  Status:  Discontinued     400 mg 200 mL/hr over 60 Minutes Intravenous Every 12 hours 07/25/19 1527 07/29/19 1404   07/22/19 1330  ciprofloxacin (CIPRO) tablet 500 mg  Status:  Discontinued     500 mg Oral Daily 07/22/19 1329 07/25/19 1527   07/22/19 1100  cefTRIAXone (ROCEPHIN) 2 g in sodium chloride 0.9 % 100 mL IVPB  Status:  Discontinued     2 g 200 mL/hr over 30 Minutes Intravenous Every 24 hours 07/22/19 1045 07/22/19 1334       Objective: Vitals:   07/30/19 1822 07/30/19 2112 07/31/19 0454 07/31/19 1008  BP: (!) 120/51 (!) 123/50 (!) 145/43 (!) 137/53  Pulse: 72 79 81 77  Resp: 18 16 19 18   Temp: 98.4 F (36.9 C) 98.3 F (36.8 C) 97.8 F (36.6 C) 97.7 F (36.5 C)  TempSrc:  Oral Oral   SpO2: 96% 93% 93% 94%  Weight:  89.7 kg    Height:        Intake/Output Summary (Last 24 hours) at 07/31/2019 1335 Last data filed at 07/31/2019 0900 Gross per 24 hour  Intake 300 ml  Output 0 ml  Net 300 ml   Filed Weights   07/30/19 0600 07/30/19 1129 07/30/19 2112  Weight: 87.5 kg 89.7 kg 89.7 kg    Examination: General exam: Appears comfortable    HEENT: PERRLA, oral mucosa moist, no sclera icterus or thrush Respiratory system: Clear to auscultation. Respiratory effort normal. Cardiovascular system: S1 & S2 heard, RRR.   Gastrointestinal system: Abdomen soft, non-tender, nondistended. Normal bowel sounds. Central nervous system: Alert and oriented. No focal neurological deficits. Extremities: No cyanosis, clubbing or edema Skin: No rashes or ulcers Psychiatry:  Mood & affect appropriate.  Data Reviewed: I have personally reviewed following labs and imaging studies  CBC: Recent Labs  Lab 07/28/19 0420 07/29/19 0300 07/29/19 1538 07/30/19 0251 07/31/19 0338  WBC 8.3 8.9 9.5 9.3 8.8  HGB 8.0* 7.4* 8.6* 8.1* 8.0*  HCT 25.8* 23.8* 27.1* 25.7* 25.6*  MCV 87.5 87.8 88.9 89.5 90.5  PLT 177 180 178 190 876   Basic Metabolic Panel: Recent Labs  Lab 07/27/19 0445 07/28/19 0420 07/28/19 1644 07/29/19 0300 07/29/19 1538 07/30/19 0251 07/31/19 0338  NA 134* 135 136 135 133* 136 138  K 4.4 4.4 4.8 4.5 4.4 4.7 5.0  CL 98 101 102 102 101 101 104  CO2 27 26 28 28 27 25 26   GLUCOSE 104* 109* 126* 97 104* 90 94  BUN 24* 11 9 8 8 10 20   CREATININE 2.86* 1.99* 1.93* 1.74* 1.79* 2.31* 3.72*  CALCIUM 7.7* 7.8* 8.2* 7.8* 7.7* 8.1* 8.1*  MG 2.0 2.1  --  2.1  --  2.0 2.1  PHOS 2.7 1.7*  1.6* 1.4* 1.7* 2.1* 2.9 3.4   GFR: Estimated Creatinine Clearance: 12.9 mL/min (A) (by C-G formula based on SCr of 3.72 mg/dL (H)). Liver Function Tests: Recent Labs  Lab 07/28/19 1644 07/29/19 0300 07/29/19 1538 07/30/19 0251 07/31/19 0338  ALBUMIN 1.9* 1.8* 1.8* 1.8* 1.7*   No results for input(s): LIPASE, AMYLASE in the last 168 hours. No results for input(s): AMMONIA in the last 168 hours. Coagulation Profile: No results for input(s): INR, PROTIME in the last 168 hours. Cardiac Enzymes: No results for input(s): CKTOTAL, CKMB, CKMBINDEX, TROPONINI in the last 168 hours. BNP (last 3 results) No results for input(s): PROBNP in the  last 8760 hours. HbA1C: No results for input(s): HGBA1C in the last 72 hours. CBG: Recent Labs  Lab 07/30/19 1145 07/30/19 1638 07/30/19 2112 07/31/19 0651 07/31/19 1137  GLUCAP 89 97 145* 108* 89   Lipid Profile: No results for input(s): CHOL, HDL, LDLCALC, TRIG, CHOLHDL, LDLDIRECT in the last 72 hours. Thyroid Function Tests: No results for input(s): TSH, T4TOTAL, FREET4, T3FREE, THYROIDAB in the last 72 hours. Anemia Panel: No results for input(s): VITAMINB12, FOLATE, FERRITIN, TIBC, IRON, RETICCTPCT in the last 72 hours. Urine analysis:    Component Value Date/Time   COLORURINE YELLOW 07/22/2019 1038   APPEARANCEUR TURBID (A) 07/22/2019 1038   LABSPEC >1.030 (H) 07/22/2019 1038   PHURINE 6.0 07/22/2019 1038   GLUCOSEU NEGATIVE 07/22/2019 1038   HGBUR SMALL (A) 07/22/2019 1038   BILIRUBINUR SMALL (A) 07/22/2019 1038   KETONESUR TRACE (A) 07/22/2019 1038   PROTEINUR 100 (A) 07/22/2019 1038   UROBILINOGEN 0.2 01/29/2007 1500   NITRITE NEGATIVE 07/22/2019 1038   LEUKOCYTESUR MODERATE (A) 07/22/2019 1038   Sepsis Labs: @LABRCNTIP (procalcitonin:4,lacticidven:4) )No results found for this or any previous visit (from the past 240 hour(s)).       Radiology Studies: IR Fluoro Guide CV Line Left  Result Date: 07/29/2019 CLINICAL DATA:  Renal failure, needs durable venous access for hemodialysis. Existing right port catheter EXAM: TUNNELED HEMODIALYSIS CATHETER PLACEMENT WITH ULTRASOUND AND FLUOROSCOPIC GUIDANCE TECHNIQUE: The procedure, risks, benefits, and alternatives were explained to the patient. Questions regarding the procedure were encouraged and answered. The patient understands and consents to the procedure. As antibiotic prophylaxis, cefazolin 2 g was ordered pre-procedure and administered intravenously within one hour of incision. Patency of the left IJ vein was confirmed with ultrasound with image documentation. An appropriate skin site was determined. Region was  prepped using maximum barrier technique including cap and  mask, sterile gown, sterile gloves, large sterile sheet, and Chlorhexidine as cutaneous antisepsis. The region was infiltrated locally with 1% lidocaine. Intravenous Fentanyl 12mcg and Versed 1mg  were administered as conscious sedation during continuous monitoring of the patient's level of consciousness and physiological / cardiorespiratory status by the radiology RN, with a total moderate sedation time of 13 minutes. Under real-time ultrasound guidance, the left IJ vein was accessed with a 21 gauge micropuncture needle; the needle tip within the vein was confirmed with ultrasound image documentation. Needle exchanged over the 018 guidewire for transitional dilator, which allowed advancement of a Benson wire into the IVC. Over this, an MPA catheter was advanced. A Palindrome 23 hemodialysis catheter was tunneled from the left anterior chest wall approach to the IJ dermatotomy site. The MPA catheter was exchanged over an Amplatz wire for serial vascular dilators which allow placement of a peel-away sheath, through which the catheter was advanced under intermittent fluoroscopy, positioned with its tips in the proximal and midright atrium. Spot chest radiograph confirms good catheter position. No pneumothorax. Catheter was flushed and primed per protocol. Catheter secured externally with O Prolene sutures. The left IJ dermatotomy site was closed with Dermabond. COMPLICATIONS: COMPLICATIONS None immediate FLUOROSCOPY TIME:  1.1 minutes; 149  uGym2 DAP IMPRESSION: 1. Technically successful placement of tunneled left IJ hemodialysis catheter with ultrasound and fluoroscopic guidance. Ready for routine use. ACCESS: Remains approachable for percutaneous intervention as needed. Electronically Signed   By: Lucrezia Europe M.D.   On: 07/29/2019 15:37   IR US Guide Vasc Access Left  Result Date: 07/29/2019 CLINICAL DATA:  Renal failure, needs durable venous access for  hemodialysis. Existing right port catheter EXAM: TUNNELED HEMODIALYSIS CATHETER PLACEMENT WITH ULTRASOUND AND FLUOROSCOPIC GUIDANCE TECHNIQUE: The procedure, risks, benefits, and alternatives were explained to the patient. Questions regarding the procedure were encouraged and answered. The patient understands and consents to the procedure. As antibiotic prophylaxis, cefazolin 2 g was ordered pre-procedure and administered intravenously within one hour of incision. Patency of the left IJ vein was confirmed with ultrasound with image documentation. An appropriate skin site was determined. Region was prepped using maximum barrier technique including cap and mask, sterile gown, sterile gloves, large sterile sheet, and Chlorhexidine as cutaneous antisepsis. The region was infiltrated locally with 1% lidocaine. Intravenous Fentanyl 33mcg and Versed 1mg  were administered as conscious sedation during continuous monitoring of the patient's level of consciousness and physiological / cardiorespiratory status by the radiology RN, with a total moderate sedation time of 13 minutes. Under real-time ultrasound guidance, the left IJ vein was accessed with a 21 gauge micropuncture needle; the needle tip within the vein was confirmed with ultrasound image documentation. Needle exchanged over the 018 guidewire for transitional dilator, which allowed advancement of a Benson wire into the IVC. Over this, an MPA catheter was advanced. A Palindrome 23 hemodialysis catheter was tunneled from the left anterior chest wall approach to the IJ dermatotomy site. The MPA catheter was exchanged over an Amplatz wire for serial vascular dilators which allow placement of a peel-away sheath, through which the catheter was advanced under intermittent fluoroscopy, positioned with its tips in the proximal and midright atrium. Spot chest radiograph confirms good catheter position. No pneumothorax. Catheter was flushed and primed per protocol. Catheter  secured externally with O Prolene sutures. The left IJ dermatotomy site was closed with Dermabond. COMPLICATIONS: COMPLICATIONS None immediate FLUOROSCOPY TIME:  1.1 minutes; 149  uGym2 DAP IMPRESSION: 1. Technically successful placement of tunneled left IJ hemodialysis catheter with ultrasound  and fluoroscopic guidance. Ready for routine use. ACCESS: Remains approachable for percutaneous intervention as needed. Electronically Signed   By: Lucrezia Europe M.D.   On: 07/29/2019 15:37      Scheduled Meds: . allopurinol  100 mg Oral Daily  . amiodarone  200 mg Oral Daily  . apixaban  5 mg Oral BID  . atorvastatin  40 mg Oral QHS  . B-complex with vitamin C  1 tablet Oral Daily  . Chlorhexidine Gluconate Cloth  6 each Topical Daily  . darbepoetin (ARANESP) injection - NON-DIALYSIS  150 mcg Subcutaneous Q Thu-1800  . docusate sodium  100 mg Oral Daily  . feeding supplement (ENSURE ENLIVE)  237 mL Oral BID WC  . feeding supplement (PRO-STAT SUGAR FREE 64)  30 mL Oral BID  . umeclidinium bromide  1 puff Inhalation Daily   And  . fluticasone furoate-vilanterol  1 puff Inhalation Daily  . insulin aspart  0-9 Units Subcutaneous TID WC  . insulin glargine  5 Units Subcutaneous QHS  . metoprolol tartrate  25 mg Oral BID  . pantoprazole  40 mg Oral BID AC  . sertraline  50 mg Oral Daily   Continuous Infusions: . sodium chloride Stopped (07/29/19 1306)  . phenylephrine (NEO-SYNEPHRINE) Adult infusion Stopped (07/28/19 0343)     LOS: 13 days      Debbe Odea, MD [facility] Pager: [email or web address] Password [email or web address] 07/31/2019, 1:35 PM

## 2019-07-31 NOTE — Progress Notes (Signed)
Subjective:  -  Had a good night, slept well.   No UOP - she confirms  Objective Vital signs in last 24 hours: Vitals:   07/30/19 1822 07/30/19 2112 07/31/19 0454 07/31/19 1008  BP: (!) 120/51 (!) 123/50 (!) 145/43 (!) 137/53  Pulse: 72 79 81 77  Resp: 18 16 19 18   Temp: 98.4 F (36.9 C) 98.3 F (36.8 C) 97.8 F (36.6 C) 97.7 F (36.5 C)  TempSrc:  Oral Oral   SpO2: 96% 93% 93% 94%  Weight:  89.7 kg    Height:       Weight change: 2.2 kg  Intake/Output Summary (Last 24 hours) at 07/31/2019 1053 Last data filed at 07/31/2019 0600 Gross per 24 hour  Intake 180 ml  Output 0 ml  Net 180 ml    Assessment/ Plan: Pt is a 73 y.o. yo female with COPD and diastolic heart failure, CKD with baseline crt around 3 who was admitted on 07/17/2019 to [facility] had Afib refractory to tx-  Developed A on CRF and GIB-  Transferred to [facility] on 12/20 for initiation of CRRT  Assessment/Plan: 1. Renal-  Baseline CKD- of late crt around 3.  Developed A on CRF in the setting of Afib with hemodynamic instability.  On CRRT from 12/20- 12/24.  Unfortunately no UOP  crt from 1.8 to now 3.7 off of CRRT.   has a TDC.  No absolute indications for RRT today,but since no UOP will plan for HD tomorrow.  Unclear if dialysis dependence will be permanent at this point but likely need to make arrangements like it will.  She is thinking about her disposition options  2. HTN/vol-  Volume better after several days of CRRT - amio and lopressor-  No change  3. A fib-   Now NSR 4. Anemia- CKD and GIB (egd showed esophagitis)- on ESA-  hgb stable in the 8's - on protonix as well.  Iron is low, got 2 doses of feraheme  5. ID-  UTI on cipro 6. Bones-  Phos OK no binder-  Check PTH with HD tomorrow   Louis Meckel    Labs: Basic Metabolic Panel: Recent Labs  Lab 07/29/19 1538 07/30/19 0251 07/31/19 0338  NA 133* 136 138  K 4.4 4.7 5.0  CL 101 101 104  CO2 27 25 26   GLUCOSE 104* 90 94  BUN 8 10 20   CREATININE  1.79* 2.31* 3.72*  CALCIUM 7.7* 8.1* 8.1*  PHOS 2.1* 2.9 3.4   Liver Function Tests: Recent Labs  Lab 07/29/19 1538 07/30/19 0251 07/31/19 0338  ALBUMIN 1.8* 1.8* 1.7*   No results for input(s): LIPASE, AMYLASE in the last 168 hours. No results for input(s): AMMONIA in the last 168 hours. CBC: Recent Labs  Lab 07/28/19 0420 07/29/19 0300 07/29/19 1538 07/30/19 0251 07/31/19 0338  WBC 8.3 8.9 9.5 9.3 8.8  HGB 8.0* 7.4* 8.6* 8.1* 8.0*  HCT 25.8* 23.8* 27.1* 25.7* 25.6*  MCV 87.5 87.8 88.9 89.5 90.5  PLT 177 180 178 190 178   Cardiac Enzymes: No results for input(s): CKTOTAL, CKMB, CKMBINDEX, TROPONINI in the last 168 hours. CBG: Recent Labs  Lab 07/30/19 0654 07/30/19 1145 07/30/19 1638 07/30/19 2112 07/31/19 0651  GLUCAP 94 89 97 145* 108*    Iron Studies: No results for input(s): IRON, TIBC, TRANSFERRIN, FERRITIN in the last 72 hours. Studies/Results: IR Fluoro Guide CV Line Left  Result Date: 07/29/2019 CLINICAL DATA:  Renal failure, needs durable venous access for hemodialysis.  Existing right port catheter EXAM: TUNNELED HEMODIALYSIS CATHETER PLACEMENT WITH ULTRASOUND AND FLUOROSCOPIC GUIDANCE TECHNIQUE: The procedure, risks, benefits, and alternatives were explained to the patient. Questions regarding the procedure were encouraged and answered. The patient understands and consents to the procedure. As antibiotic prophylaxis, cefazolin 2 g was ordered pre-procedure and administered intravenously within one hour of incision. Patency of the left IJ vein was confirmed with ultrasound with image documentation. An appropriate skin site was determined. Region was prepped using maximum barrier technique including cap and mask, sterile gown, sterile gloves, large sterile sheet, and Chlorhexidine as cutaneous antisepsis. The region was infiltrated locally with 1% lidocaine. Intravenous Fentanyl 46mcg and Versed 1mg  were administered as conscious sedation during continuous  monitoring of the patient's level of consciousness and physiological / cardiorespiratory status by the radiology RN, with a total moderate sedation time of 13 minutes. Under real-time ultrasound guidance, the left IJ vein was accessed with a 21 gauge micropuncture needle; the needle tip within the vein was confirmed with ultrasound image documentation. Needle exchanged over the 018 guidewire for transitional dilator, which allowed advancement of a Benson wire into the IVC. Over this, an MPA catheter was advanced. A Palindrome 23 hemodialysis catheter was tunneled from the left anterior chest wall approach to the IJ dermatotomy site. The MPA catheter was exchanged over an Amplatz wire for serial vascular dilators which allow placement of a peel-away sheath, through which the catheter was advanced under intermittent fluoroscopy, positioned with its tips in the proximal and midright atrium. Spot chest radiograph confirms good catheter position. No pneumothorax. Catheter was flushed and primed per protocol. Catheter secured externally with O Prolene sutures. The left IJ dermatotomy site was closed with Dermabond. COMPLICATIONS: COMPLICATIONS None immediate FLUOROSCOPY TIME:  1.1 minutes; 149  uGym2 DAP IMPRESSION: 1. Technically successful placement of tunneled left IJ hemodialysis catheter with ultrasound and fluoroscopic guidance. Ready for routine use. ACCESS: Remains approachable for percutaneous intervention as needed. Electronically Signed   By: Lucrezia Europe M.D.   On: 07/29/2019 15:37   IR US Guide Vasc Access Left  Result Date: 07/29/2019 CLINICAL DATA:  Renal failure, needs durable venous access for hemodialysis. Existing right port catheter EXAM: TUNNELED HEMODIALYSIS CATHETER PLACEMENT WITH ULTRASOUND AND FLUOROSCOPIC GUIDANCE TECHNIQUE: The procedure, risks, benefits, and alternatives were explained to the patient. Questions regarding the procedure were encouraged and answered. The patient understands and  consents to the procedure. As antibiotic prophylaxis, cefazolin 2 g was ordered pre-procedure and administered intravenously within one hour of incision. Patency of the left IJ vein was confirmed with ultrasound with image documentation. An appropriate skin site was determined. Region was prepped using maximum barrier technique including cap and mask, sterile gown, sterile gloves, large sterile sheet, and Chlorhexidine as cutaneous antisepsis. The region was infiltrated locally with 1% lidocaine. Intravenous Fentanyl 63mcg and Versed 1mg  were administered as conscious sedation during continuous monitoring of the patient's level of consciousness and physiological / cardiorespiratory status by the radiology RN, with a total moderate sedation time of 13 minutes. Under real-time ultrasound guidance, the left IJ vein was accessed with a 21 gauge micropuncture needle; the needle tip within the vein was confirmed with ultrasound image documentation. Needle exchanged over the 018 guidewire for transitional dilator, which allowed advancement of a Benson wire into the IVC. Over this, an MPA catheter was advanced. A Palindrome 23 hemodialysis catheter was tunneled from the left anterior chest wall approach to the IJ dermatotomy site. The MPA catheter was exchanged over an Amplatz wire  for serial vascular dilators which allow placement of a peel-away sheath, through which the catheter was advanced under intermittent fluoroscopy, positioned with its tips in the proximal and midright atrium. Spot chest radiograph confirms good catheter position. No pneumothorax. Catheter was flushed and primed per protocol. Catheter secured externally with O Prolene sutures. The left IJ dermatotomy site was closed with Dermabond. COMPLICATIONS: COMPLICATIONS None immediate FLUOROSCOPY TIME:  1.1 minutes; 149  uGym2 DAP IMPRESSION: 1. Technically successful placement of tunneled left IJ hemodialysis catheter with ultrasound and fluoroscopic  guidance. Ready for routine use. ACCESS: Remains approachable for percutaneous intervention as needed. Electronically Signed   By: Lucrezia Europe M.D.   On: 07/29/2019 15:37   Medications: Infusions: . sodium chloride Stopped (07/29/19 1306)  . phenylephrine (NEO-SYNEPHRINE) Adult infusion Stopped (07/28/19 0343)    Scheduled Medications: . allopurinol  100 mg Oral Daily  . amiodarone  200 mg Oral Daily  . apixaban  5 mg Oral BID  . atorvastatin  40 mg Oral QHS  . B-complex with vitamin C  1 tablet Oral Daily  . Chlorhexidine Gluconate Cloth  6 each Topical Daily  . darbepoetin (ARANESP) injection - NON-DIALYSIS  150 mcg Subcutaneous Q Thu-1800  . docusate sodium  100 mg Oral Daily  . feeding supplement (ENSURE ENLIVE)  237 mL Oral BID WC  . feeding supplement (PRO-STAT SUGAR FREE 64)  30 mL Oral BID  . umeclidinium bromide  1 puff Inhalation Daily   And  . fluticasone furoate-vilanterol  1 puff Inhalation Daily  . insulin aspart  0-9 Units Subcutaneous TID WC  . insulin glargine  5 Units Subcutaneous QHS  . metoprolol tartrate  25 mg Oral BID  . pantoprazole  40 mg Oral BID AC  . sertraline  50 mg Oral Daily    have reviewed scheduled and prn medications.  Physical Exam: General:  looks good, no complaints Heart: RRR Lungs: mostly clear Abdomen: soft, non tender Extremities: obese, difficult to tell but does not seem to have edema Dialysis Access: tunneled HD cath  On left    07/31/2019,10:53 AM  LOS: 13 days

## 2019-08-01 DIAGNOSIS — N184 Chronic kidney disease, stage 4 (severe): Secondary | ICD-10-CM

## 2019-08-01 LAB — CBC
HCT: 30 % — ABNORMAL LOW (ref 36.0–46.0)
HCT: 28.3 % — ABNORMAL LOW (ref 36.0–46.0)
Hemoglobin: 9.5 g/dL — ABNORMAL LOW (ref 12.0–15.0)
Hemoglobin: 9 g/dL — ABNORMAL LOW (ref 12.0–15.0)
MCH: 28.7 pg (ref 26.0–34.0)
MCH: 28.8 pg (ref 26.0–34.0)
MCHC: 31.7 g/dL (ref 30.0–36.0)
MCHC: 31.8 g/dL (ref 30.0–36.0)
MCV: 90.6 fL (ref 80.0–100.0)
MCV: 90.4 fL (ref 80.0–100.0)
Platelets: 215 10*3/uL (ref 150–400)
Platelets: 205 10*3/uL (ref 150–400)
RBC: 3.31 MIL/uL — ABNORMAL LOW (ref 3.87–5.11)
RBC: 3.13 MIL/uL — ABNORMAL LOW (ref 3.87–5.11)
RDW: 19.1 % — ABNORMAL HIGH (ref 11.5–15.5)
RDW: 19.3 % — ABNORMAL HIGH (ref 11.5–15.5)
WBC: 9.6 10*3/uL (ref 4.0–10.5)
WBC: 9.8 10*3/uL (ref 4.0–10.5)
nRBC: 0.3 % — ABNORMAL HIGH (ref 0.0–0.2)
nRBC: 0.3 % — ABNORMAL HIGH (ref 0.0–0.2)

## 2019-08-01 LAB — RENAL FUNCTION PANEL
Albumin: 1.7 g/dL — ABNORMAL LOW (ref 3.5–5.0)
Albumin: 1.9 g/dL — ABNORMAL LOW (ref 3.5–5.0)
Albumin: 1.9 g/dL — ABNORMAL LOW (ref 3.5–5.0)
Anion gap: 8 (ref 5–15)
Anion gap: 9 (ref 5–15)
Anion gap: 8 (ref 5–15)
BUN: 20 mg/dL (ref 8–23)
BUN: 30 mg/dL — ABNORMAL HIGH (ref 8–23)
BUN: 29 mg/dL — ABNORMAL HIGH (ref 8–23)
CO2: 26 mmol/L (ref 22–32)
CO2: 24 mmol/L (ref 22–32)
CO2: 25 mmol/L (ref 22–32)
Calcium: 8.1 mg/dL — ABNORMAL LOW (ref 8.9–10.3)
Calcium: 8.5 mg/dL — ABNORMAL LOW (ref 8.9–10.3)
Calcium: 8.3 mg/dL — ABNORMAL LOW (ref 8.9–10.3)
Chloride: 104 mmol/L (ref 98–111)
Chloride: 104 mmol/L (ref 98–111)
Chloride: 103 mmol/L (ref 98–111)
Creatinine, Ser: 3.72 mg/dL — ABNORMAL HIGH (ref 0.44–1.00)
Creatinine, Ser: 4.35 mg/dL — ABNORMAL HIGH (ref 0.44–1.00)
Creatinine, Ser: 4.18 mg/dL — ABNORMAL HIGH (ref 0.44–1.00)
GFR calc Af Amer: 13 mL/min — ABNORMAL LOW (ref >60)
GFR calc Af Amer: 11 mL/min — ABNORMAL LOW (ref >60)
GFR calc Af Amer: 11 mL/min — ABNORMAL LOW (ref >60)
GFR calc non Af Amer: 11 mL/min — ABNORMAL LOW (ref >60)
GFR calc non Af Amer: 9 mL/min — ABNORMAL LOW (ref >60)
GFR calc non Af Amer: 10 mL/min — ABNORMAL LOW (ref >60)
Glucose, Bld: 94 mg/dL (ref 70–99)
Glucose, Bld: 98 mg/dL (ref 70–99)
Glucose, Bld: 91 mg/dL (ref 70–99)
Phosphorus: 3.4 mg/dL (ref 2.5–4.6)
Phosphorus: 4.2 mg/dL (ref 2.5–4.6)
Phosphorus: 4.1 mg/dL (ref 2.5–4.6)
Potassium: 5 mmol/L (ref 3.5–5.1)
Potassium: 5.2 mmol/L — ABNORMAL HIGH (ref 3.5–5.1)
Potassium: 5.1 mmol/L (ref 3.5–5.1)
Sodium: 138 mmol/L (ref 135–145)
Sodium: 137 mmol/L (ref 135–145)
Sodium: 136 mmol/L (ref 135–145)

## 2019-08-01 LAB — MAGNESIUM: Magnesium: 2 mg/dL (ref 1.7–2.4)

## 2019-08-01 LAB — HEPATITIS PANEL, ACUTE
HCV Ab: NONREACTIVE
Hep A IgM: NONREACTIVE
Hep B C IgM: NONREACTIVE
Hepatitis B Surface Ag: NONREACTIVE

## 2019-08-01 LAB — PARATHYROID HORMONE, INTACT (NO CA): PTH: 104 pg/mL — ABNORMAL HIGH (ref 15–65)

## 2019-08-01 LAB — GLUCOSE, CAPILLARY
Glucose-Capillary: 76 mg/dL (ref 70–99)
Glucose-Capillary: 90 mg/dL (ref 70–99)

## 2019-08-01 LAB — HEPATITIS B CORE ANTIBODY, TOTAL: Hep B Core Total Ab: NONREACTIVE

## 2019-08-01 MED ORDER — ALTEPLASE 2 MG IJ SOLR
2.0000 mg | Freq: Once | INTRAMUSCULAR | Status: AC
  Administered 2019-08-01: 2 mg
  Filled 2019-08-01: qty 2

## 2019-08-01 MED ORDER — HEPARIN SODIUM (PORCINE) 1000 UNIT/ML IJ SOLN
INTRAMUSCULAR | Status: AC
  Filled 2019-08-01: qty 4

## 2019-08-01 NOTE — Consult Note (Addendum)
Hospital Consult    Reason for Consult:  In need of permanent HD access Requesting Physician:  Joelyn Oms MRN #:  [id number]  History of Present Illness: This is a 73 y.o. female who was admitted ~ 2 weeks ago with coffee ground emesis and abdominal pain.  Prior to that, she had been hospitalized for volume overload.    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  She has required CRRT in the hospital.  She underwent TDC placement by IR earlier this week.   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  Pt is right hand dominant.   She has hx of afib and has been on heparin gtt (on Eliquis PTA).  She had upper GIB and EGD revealed esophagitis.  Her hgb has been stable in the 8's now is in the 9's and is now on Protonix.    Her Eliquis was restarted on 12/25.   She was on cipro for UTI.  Her WBC is normal.   The pt is on a statin for cholesterol management.  The pt is on a daily aspirin.   Other AC:  Elilquis The pt is on CCB, BB for hypertension.   The pt is diabetic.   Tobacco hx:  Remote-quit 1963  Past Medical History:  Diagnosis Date  . Blood transfusion without reported diagnosis   . CAD (coronary artery disease)    Nonobstructive at cardiac catheterization 2000  . Cataract   . Cervical cancer ([place]) 1978  . CHF (congestive heart failure) ([place])   . Chronic back pain   . Chronic kidney disease   . COPD (chronic obstructive pulmonary disease) ([place])   . Degenerative disc disease   . Essential hypertension, benign   . GERD (gastroesophageal reflux disease)   . Gout   . Hiatal hernia 07/27/2013  . History of diverticulitis of colon   . History of hiatal hernia   . Iron deficiency anemia   . Irritable bowel syndrome   . Lumbar radiculopathy   . Mixed hyperlipidemia   . Moderate major depression, single episode ([place]) 07/21/2019  . Neuropathy   . Osteoporosis   . Ovarian cancer [facility]) 1978   patient denies. States this was cervical cancer    . Oxygen deficiency   . Sleep apnea   . Type 2 diabetes mellitus ([place])   . Vitamin B deficiency 12/25/2009  . Vitamin B12 deficiency     Past Surgical History:  Procedure Laterality Date  . ABDOMINAL HYSTERECTOMY    . BACK SURGERY    . Benign breast cysts    . CHOLECYSTECTOMY    . COLONOSCOPY  10/01/2006   SLF:Pan colonic diverticulosis and moderate internal hemorrhoids/ Otherwise no polyps, masses, inflammatory changes or AVMs/  . COLONOSCOPY  2011   SLF: pancolonic diverticulosis, large internal hemorrhoids  . COLONOSCOPY N/A 01/26/2016   Procedure: COLONOSCOPY;  Surgeon: [name], MD;  Location: [facility];  Service: Endoscopy;  Laterality: N/A;  830   . ESOPHAGOGASTRODUODENOSCOPY  11/19/2006   SLF: Large hiatal hernia without evidence of Cameron ulcers/. Distal esophageal stricture, which allowed the gastroscope to pass without resistance.  A 16 mm Savary later passed with mild resistance/ Normal stomach.sb bx negative  . ESOPHAGOGASTRODUODENOSCOPY  10/01/2006   BHA:LPFXT hiatal hernia.  Distal esophagus without evidence of   erythema, ulceration or Barrett's esophagus  . ESOPHAGOGASTRODUODENOSCOPY  2011   SLF: large hh, distal esophageal web narrowing to 8mm s/p  dilation to 16mm  . ESOPHAGOGASTRODUODENOSCOPY N/A 08/06/2013   SLF: 1. Stricture at the gastroesophageal junction 2. large hiatal hernia. 3. Mild erosive gastritis.  Marland Kitchen ESOPHAGOGASTRODUODENOSCOPY (EGD) WITH PROPOFOL N/A 07/19/2019   Procedure: ESOPHAGOGASTRODUODENOSCOPY (EGD) WITH POSSIBLE ESOPHAGEAL DILATION WITH PROPOFOL;  Surgeon: Daneil Dolin, MD;  Location: [facility];  Service: Endoscopy;  Laterality: N/A;  . GIVENS CAPSULE STUDY N/A 08/06/2013   INCOMPLETE-SMALL BOWLE ULCERS  . IR FLUORO GUIDE CV LINE LEFT  07/29/2019  . IR US GUIDE VASC ACCESS LEFT  07/29/2019  . KNEE SURGERY Right   . PARTIAL HYSTERECTOMY  1978  . small bowel capsule  2008   negative  . SPINE SURGERY    . TONSILLECTOMY AND  ADENOIDECTOMY    . Two back surgeries/fusion    . UMBILICAL HERNIA REPAIR  2010    No Known Allergies  Prior to Admission medications   Medication Sig Start Date End Date Taking? Authorizing Provider  albuterol (PROVENTIL) (2.5 MG/3ML) 0.083% nebulizer solution Take 2.5 mg by nebulization every 6 (six) hours as needed for wheezing or shortness of breath.  04/09/19  Yes [provider]  albuterol (VENTOLIN HFA) 108 (90 Base) MCG/ACT inhaler Inhale 2 puffs into the lungs every 6 (six) hours as needed for wheezing or shortness of breath.  04/09/19  Yes [provider]  fluconazole (DIFLUCAN) 50 MG tablet Take 50 mg by mouth daily. Started on 07/15/19 per Nurse at [facility].   Yes [provider]  mirtazapine (REMERON) 15 MG tablet Take 15 mg by mouth at bedtime.   Yes [provider]  NONFORMULARY OR COMPOUNDED ITEM May use CPaP from home at previous home settings while asleep. Every Shift Day, Evening, Night   Yes [provider]  OXYGEN Inhale 2 L into the lungs. Every Shift   Yes [provider]  allopurinol (ZYLOPRIM) 100 MG tablet Take 100 mg by mouth 2 (two) times daily.  10/09/12   [provider]  apixaban (ELIQUIS) 5 MG TABS tablet Take 1 tablet (5 mg total) by mouth 2 (two) times daily. 06/21/19   Johnson, Clanford L, MD  aspirin EC 81 MG tablet Take 81 mg by mouth daily.    [provider]  atorvastatin (LIPITOR) 40 MG tablet Take 1 tablet (40 mg total) by mouth at bedtime. 05/04/18   Satira Sark, MD  Biotin 5 MG CAPS Take 5 mg by mouth daily.    [provider]  diltiazem (CARDIZEM CD) 120 MG 24 hr capsule Take 1 capsule (120 mg total) by mouth daily. 06/22/19   Johnson, Clanford L, MD  esomeprazole (NEXIUM) 40 MG capsule TAKE 1 CAPSULE TWICE A DAY 30 MINUTES PRIOR TO MEALS 01/19/18   Annitta Needs, NP  famotidine (PEPCID) 40 MG tablet TAKE 1 TABLET AT BEDTIME AS NEEDED TO CONTROL REFLUX Patient not  taking: Reported on 07/19/2019 06/23/15   Mannam, Hart Robinsons, MD  feeding supplement, GLUCERNA SHAKE, (GLUCERNA SHAKE) LIQD Take 237 mLs by mouth 2 (two) times daily between meals.    [provider]  hydrALAZINE (APRESOLINE) 25 MG tablet Take 25 mg by mouth 2 (two) times daily.    [provider]  LANTUS SOLOSTAR 100 UNIT/ML SOPN Inject 8 Units into the skin at bedtime.  10/06/12   [provider]  loperamide (IMODIUM) 2 MG capsule Take 2 mg by mouth every 6 (six) hours as needed for diarrhea or loose stools.     [provider]  metoprolol succinate (TOPROL-XL) 25 MG 24 hr tablet Take 25 mg by mouth daily. Along with 50 mg to = 75 mg    [provider]  metoprolol succinate (TOPROL-XL) 50 MG 24 hr tablet Take 50 mg by mouth daily. Along with 25 mg to = 75 mg    [provider]  potassium chloride SA (KLOR-CON) 10 MEQ tablet Take 1 tablet (10 mEq total) by mouth every other day. 07/08/19   Johnson, Clanford L, MD  sertraline (ZOLOFT) 50 MG tablet Take 50 mg by mouth daily.    [provider]  tamsulosin (FLOMAX) 0.4 MG CAPS capsule Take 1 capsule (0.4 mg total) by mouth daily. 07/08/19   Johnson, Clanford L, MD  torsemide (DEMADEX) 10 MG tablet Take 5 tablets (50 mg total) by mouth daily. 07/09/19   Johnson, Clanford L, MD  TRELEGY ELLIPTA 100-62.5-25 MCG/INH AEPB Inhale 1 puff into the lungs daily. 05/13/19   [provider]    Social History   Socioeconomic History  . Marital status: Widowed    Spouse name: Swartzlander  . Number of children: 4  . Years of education: Not on file  . Highest education level: 10th grade  Occupational History  . Occupation: retired    Comment: Ambulance person, [employer] work  Tobacco Use  . Smoking status: Former Smoker    Packs/day: 1.00    Years: 1.00    Pack years: 1.00    Types: Cigarettes    Start date: 02/19/1961    Quit date: 08/05/1961    Years since quitting: 58.0  . Smokeless tobacco: Never Used   . Tobacco comment: Quit smoking x 50 years  Substance and Sexual Activity  . Alcohol use: No  . Drug use: No  . Sexual activity: Not Currently  Other Topics Concern  . Not on file  Social History Narrative   HAS 4 SON-GRAND [place].   Lives in home with [place] - married 13 Y   Cook, sew, quilt, crochet   Social Determinants of Health   Financial Resource Strain:   . Difficulty of Paying Living Expenses: Not on file  Food Insecurity:   . Worried About Charity fundraiser in the Last Year: Not on file  . Ran Out of Food in the Last Year: Not on file  Transportation Needs:   . Lack of Transportation (Medical): Not on file  . Lack of Transportation (Non-Medical): Not on file  Physical Activity:   . Days of Exercise per Week: Not on file  . Minutes of Exercise per Session: Not on file  Stress:   . Feeling of Stress : Not on file  Social Connections:   . Frequency of Communication with Friends and Family: Not on file  . Frequency of Social Gatherings with Friends and Family: Not on file  . Attends Religious Services: Not on file  . Active Member of Clubs or Organizations: Not on file  . Attends Archivist Meetings: Not on file  . Marital Status: Not on file  Intimate Partner Violence:   . Fear of Current or Ex-Partner: Not on file  . Emotionally Abused: Not on file  . Physically Abused: Not on file  . Sexually Abused: Not on file     Family History  Problem Relation Age of Onset  . Colon cancer Brother        diagnosed age 13. Living.   Marland Kitchen Ulcers Sister   . Diabetes Sister   .  Heart attack Sister   . Kidney failure Sister   . Stroke Sister   . Ulcers Mother   . Diabetes Mother   . Heart attack Mother   . Stroke Mother   . Asthma Mother   . Heart disease Mother   . Cervical cancer Mother   . Heart attack Brother   . Heart disease Brother   . Asthma Sister   . Diabetes Brother   . Stroke Maternal Grandmother   . Heart attack Maternal  Grandmother   . Heart attack Other   . Early death Father        MVA in his 87s    ROS: [x]  Positive   [ ]  Negative   [ ]  All sytems reviewed and are negative  Cardiac: [x]  afib [x]  heart failure  Vascular: []  pain in legs while walking []  pain in legs at rest []  pain in legs at night []  non-healing ulcers []  hx of DVT []  swelling in legs  Pulmonary: [x]  OSA    Neurologic: []  weakness in []  arms []  legs []  numbness in []  arms []  legs []  hx of CVA []  mini stroke [] difficulty speaking or slurred speech []  temporary loss of vision in one eye []  dizziness  Hematologic: [x]  hx of cancer-cervical [x]  bleeding problems  Endocrine:   [x]  diabetes []  thyroid disease  GI [x]  esophagitis  [x]  hiatal hernia  GU: [x]  CKD/renal failure [x]  HD--[]  M/W/F or []  T/T/S [x]  recent UTI  Psychiatric: [x]  depression  Musculoskeletal: [x]  back pain  [x]  osteoporosis  Integumentary: []  rashes []  ulcers  Constitutional: []  fever []  chills   Physical Examination  Vitals:   08/01/19 0830 08/01/19 0900  BP: (!) 135/58 (!) 126/54  Pulse: 74 73  Resp:    Temp:    SpO2:     Body mass index is 40.43 kg/m.  General:  WDWN in NAD Gait: Not observed HENT: WNL, normocephalic Pulmonary: normal non-labored breathing, without Rales, rhonchi,  wheezing Cardiac: irregular, without  Murmurs, rubs or gallops Abdomen:  soft, NT/ND, no masses Skin: without rashes Vascular Exam/Pulses:  Right Left  Radial 2+ (normal) 1+ (weak)  Ulnar Unable to palpate  Unable to palpate    Extremities: without ischemic changes, without Gangrene , without cellulitis; without open wounds;  +swelling BLE;  TDC left chest; on exam, surface veins appear inadequate for fistula.  Musculoskeletal: no muscle wasting or atrophy  Neurologic: A&O X 3;  No focal weakness or paresthesias are detected; speech is fluent/normal Psychiatric:  The pt has sleepy affect as she is seen on dialysis   CBC      Component Value Date/Time   WBC 9.8 08/01/2019 0716   RBC 3.13 (L) 08/01/2019 0716   HGB 9.0 (L) 08/01/2019 0716   HCT 28.3 (L) 08/01/2019 0716   PLT 205 08/01/2019 0716   MCV 90.4 08/01/2019 0716   MCH 28.8 08/01/2019 0716   MCHC 31.8 08/01/2019 0716   RDW 19.3 (H) 08/01/2019 0716   LYMPHSABS 0.7 07/17/2019 0940   MONOABS 0.6 07/17/2019 0940   EOSABS 0.1 07/17/2019 0940   BASOSABS 0.0 07/17/2019 0940    BMET    Component Value Date/Time   NA 136 08/01/2019 0716   K 5.1 08/01/2019 0716   CL 103 08/01/2019 0716   CO2 25 08/01/2019 0716   GLUCOSE 91 08/01/2019 0716   BUN 29 (H) 08/01/2019 0716   CREATININE 4.18 (H) 08/01/2019 0716   CREATININE 1.68 (H) 10/10/2017 1050  CALCIUM 8.3 (L) 08/01/2019 0716   GFRNONAA 10 (L) 08/01/2019 0716   GFRNONAA 30 (L) 10/10/2017 1050   GFRAA 11 (L) 08/01/2019 0716   GFRAA 35 (L) 10/10/2017 1050    COAGS: Lab Results  Component Value Date   INR 1.0 10/25/2008     Non-Invasive Vascular Imaging:   BUE vein mapping ordered   ASSESSMENT/PLAN: This is a 73 y.o. female with AKI on CKD now requiring HD in need of permanent HD access.  Pt is right hand dominant.  -pt did have left IJ tunneled dialysis catheter placed earlier this week by IR.  -her vein mapping is pending and will have more accurate idea of access once this is completed.  Discussed different possible options of fistula vs graft with pt.   -most likely will be scheduled for later in the week as her Elquis will need to be held and bridged with heparin if needed and when vein mapping complete.     Leontine Locket, PA-C [facility] [phone]  I have seen and evaluated the patient. I agree with the PA note as documented above.  73 year old female admitted approximately 15 days ago with coffee-ground emesis nausea vomiting.  Ultimately underwent EGD with esophagitis and no active bleeding ulcers.  She has had a complicated hospital course in the setting of  chronic kidney disease and has had a left IJ tunneled dialysis catheter placed and initiation of hemodialysis.  Vascular surgery has been asked to evaluate for permanent access.  Patient is right-handed.  She has palpable radial brachial pulses bilaterally.  Vein mapping is pending.  Will need to hold her Eliquis for 48 hours prior to surgery.  We will look at the OR schedule later in the week once her Eliquis is held and we have vein mapping.  Marty Heck, MD [facility] [place] Office: [phone] Pager: [phone]

## 2019-08-01 NOTE — Progress Notes (Signed)
Date and time of tPA/Alteplase removal: 08/01/19 @ 1235 Good blood return noted  Janeva Peaster, Gillermina Phy, RN

## 2019-08-01 NOTE — Procedures (Signed)
I was present at this dialysis session. I have reviewed the session itself and made appropriate changes.   Seen on HD. BP stable, 2L UF goal. TDC L IJ Qb 400.  2K bath.  Sig advanced CKD pre-existing AKI.  Very likely is now long term HD dependent. WIll start CLIP 12/28. Will also go ahead with AVF eval with [facility].  Pt agrees.   Filed Weights   07/30/19 2112 07/31/19 2125 08/01/19 0446  Weight: 89.7 kg 90.3 kg 90.3 kg    Recent Labs  Lab 08/01/19 0716  NA 136  K 5.1  CL 103  CO2 25  GLUCOSE 91  BUN 29*  CREATININE 4.18*  CALCIUM 8.3*  PHOS 4.1    Recent Labs  Lab 07/31/19 0338 08/01/19 0609 08/01/19 0716  WBC 8.8 9.6 9.8  HGB 8.0* 9.5* 9.0*  HCT 25.6* 30.0* 28.3*  MCV 90.5 90.6 90.4  PLT 178 215 205    Scheduled Meds: . allopurinol  100 mg Oral Daily  . amiodarone  200 mg Oral Daily  . apixaban  5 mg Oral BID  . atorvastatin  40 mg Oral QHS  . B-complex with vitamin C  1 tablet Oral Daily  . Chlorhexidine Gluconate Cloth  6 each Topical Daily  . darbepoetin (ARANESP) injection - NON-DIALYSIS  150 mcg Subcutaneous Q Thu-1800  . docusate sodium  100 mg Oral Daily  . feeding supplement (ENSURE ENLIVE)  237 mL Oral BID WC  . feeding supplement (PRO-STAT SUGAR FREE 64)  30 mL Oral BID  . umeclidinium bromide  1 puff Inhalation Daily   And  . fluticasone furoate-vilanterol  1 puff Inhalation Daily  . insulin aspart  0-9 Units Subcutaneous TID WC  . insulin glargine  5 Units Subcutaneous QHS  . metoprolol tartrate  25 mg Oral BID  . pantoprazole  40 mg Oral BID AC  . sertraline  50 mg Oral Daily   Continuous Infusions: . sodium chloride Stopped (07/29/19 1306)  . phenylephrine (NEO-SYNEPHRINE) Adult infusion Stopped (07/28/19 0343)   PRN Meds:.sodium chloride, acetaminophen **OR** acetaminophen, albuterol   Pearson Grippe  MD 08/01/2019, 8:55 AM

## 2019-08-01 NOTE — Progress Notes (Signed)
PROGRESS NOTE    [name]   GGY:[id number]  DOB: Oct 21, 1945  DOA: 07/17/2019 PCP: [name], MD   Brief Narrative:  [name] 73 yo F with a history of diastolic heart failure, CKD stage IIIb, COPD, chronic respiratory failure, obesity, OSA, Afib (on chronic anticoagulation), GERD, hiatal hernia, gout, DM2, and chronic anemia who presented to [facility] on 12/12 after an episode of coffee ground emesis, nausea and vomiting.  She had an EGD that showed esophagitis but no active bleeding ulcers.  She was also started on ciprofloaxin for a UTI.  During her hospital stay, she developed Afib with RVR, was started on amiodarone, and has developed progressive renal failure with anuria.  She was transferred from [facility] to [facility] on 12/20 for consideration for CRRT.  12/14 EGD: esophagitis, Schatzki ring dilation 12/15 Afib with RVR: started on amiodarone 12/16: AKI on CKD, renal consulted initially 12/22: started on neo gtt early this am 07/28/2019-on neo as of 0400 this am 2/2 hypotension and inability to pull volume with crrt. Unsure if needed to pull volume with pt on baseline oxygen, without pulm edema or peripheral edema. See wt's are up but unsure if accurate. Ordered echo to determine LV/RV function and help guide management.   Subjective: She has no complaints.     Assessment & Plan:   Principal Problem:   Coffee ground emesis-  Hiatal hernia with GERD and esophagitis - EGD 12/19> -Mild erosive reflux esophagitis. - Mild Schatzki                            ring. -Dilated. Large hiatal hernia. Erythematous                            gastric mucosa.                           - Normal duodenal bulb and second portion of the                            duodenum.                           - No specimens collected. - recommendations > Protonix BID, Eliquis can be resumed on 12/20- Eliquis was resumed on 12/25  Active Problems:  A-fib with RVR - cardiology  following - on Lopressor, 25 mg BID, Amiodarone 200 mg and Eliquis    OSA on CPAP - using her CPAP from home  Acute renal failure superimposed on stage 4 chronic kidney disease  Chronic diastolic CHF (congestive heart failure)   - TDC placed and dialysis started- remains anuric with rising Cr   HLD (hyperlipidemia) - cont Lipitor  Type 2 diabetes with nephropathy  - on Lantus and Novolog SSI- sugars well controlled in 80-110 range  UTI - treated with Ceftriaxone    Obesity, Class III, BMI 40-49.9 (morbid obesity)  Body mass index is 39.54 kg/m.    Chronic respiratory failure with hypoxia    COPD (chronic obstructive pulmonary disease)  - cont Nebs and inhalers    Anemia of chronic disease - cont Anarnesp    Time spent in minutes: 35 DVT prophylaxis: Eliquis Code Status: Full code Family Communication:  Disposition Plan: cont dialysis per nephro- PT  recommend SNF vs 24 hr supervision Consultants:  Cardiology Nephrology GI Procedures:  EGD 2/14 07/25/19> Rt femoral triple lumen catheter  07/29/19> L IR tunneled HD cath  2 D ECHO 1. Left ventricular ejection fraction, by visual estimation, is 55 to 60%. The left ventricle has normal function. There is mildly increased left ventricular hypertrophy. 2. Left ventricular diastolic function could not be evaluated. 3. The left ventricle has no regional wall motion abnormalities. 4. Global right ventricle has normal systolic function.The right ventricular size is normal. No increase in right ventricular wall thickness. 5. Left atrial size was moderately dilated. 6. Right atrial size was mildly dilated. 7. Mild mitral annular calcification. 8. The mitral valve is degenerative. Trivial mitral valve regurgitation. 9. The tricuspid valve is grossly normal. Tricuspid valve regurgitation moderate. 10. The tricuspid valve was normal in structure. Tricuspid valve regurgitation moderate. 11. The aortic valve is grossly normal.  Aortic valve regurgitation is not visualized. 12. Moderately elevated pulmonary artery systolic pressure. 13. The tricuspid regurgitant velocity is 3.12 m/s, and with an assumed right atrial pressure of 15 mmHg, the estimated right ventricular systolic pressure is moderately elevated at 54.0 mmHg. 14. The inferior vena cava is dilated in size with <50% respiratory variability, suggesting right atrial pressure of 15 mmHg. Antimicrobials:  Anti-infectives (From admission, onward)   Start     Dose/Rate Route Frequency Ordered Stop   07/30/19 1015  ciprofloxacin (CIPRO) IVPB 400 mg     400 mg 200 mL/hr over 60 Minutes Intravenous Every 24 hours 07/29/19 1404 07/30/19 1101   07/29/19 1030  ceFAZolin (ANCEF) IVPB 2g/100 mL premix     2 g 200 mL/hr over 30 Minutes Intravenous To Radiology 07/29/19 1004 07/29/19 1531   07/25/19 2200  ciprofloxacin (CIPRO) IVPB 400 mg  Status:  Discontinued     400 mg 200 mL/hr over 60 Minutes Intravenous Every 12 hours 07/25/19 1527 07/29/19 1404   07/22/19 1330  ciprofloxacin (CIPRO) tablet 500 mg  Status:  Discontinued     500 mg Oral Daily 07/22/19 1329 07/25/19 1527   07/22/19 1100  cefTRIAXone (ROCEPHIN) 2 g in sodium chloride 0.9 % 100 mL IVPB  Status:  Discontinued     2 g 200 mL/hr over 30 Minutes Intravenous Every 24 hours 07/22/19 1045 07/22/19 1334       Objective: Vitals:   08/01/19 0930 08/01/19 1000 08/01/19 1030 08/01/19 1132  BP: (!) 140/53 (!) 111/41 (!) 128/53 139/66  Pulse: 74 67 78 75  Resp:   18 18  Temp:   98 F (36.7 C) 98.2 F (36.8 C)  TempSrc:   Oral Oral  SpO2:   96% 94%  Weight:   88.8 kg   Height:        Intake/Output Summary (Last 24 hours) at 08/01/2019 1317 Last data filed at 08/01/2019 0844 Gross per 24 hour  Intake 357 ml  Output 0 ml  Net 357 ml   Filed Weights   08/01/19 0446 08/01/19 0645 08/01/19 1030  Weight: 90.3 kg 90.8 kg 88.8 kg    Examination: General exam: Appears comfortable  HEENT: PERRLA,  oral mucosa moist, no sclera icterus or thrush Respiratory system: Clear to auscultation. Respiratory effort normal. Cardiovascular system: S1 & S2 heard, RRR.   Gastrointestinal system: Abdomen soft, non-tender, nondistended. Normal bowel sounds. Central nervous system: Alert and oriented. No focal neurological deficits. Extremities: No cyanosis, clubbing or edema Skin: No rashes or ulcers Psychiatry:  Mood & affect appropriate.  Data Reviewed: I have personally reviewed following labs and imaging studies  CBC: Recent Labs  Lab 07/29/19 1538 07/30/19 0251 07/31/19 0338 08/01/19 0609 08/01/19 0716  WBC 9.5 9.3 8.8 9.6 9.8  HGB 8.6* 8.1* 8.0* 9.5* 9.0*  HCT 27.1* 25.7* 25.6* 30.0* 28.3*  MCV 88.9 89.5 90.5 90.6 90.4  PLT 178 190 178 215 568   Basic Metabolic Panel: Recent Labs  Lab 07/28/19 0420 07/29/19 0300 07/29/19 1538 07/30/19 0251 07/31/19 0338 08/01/19 0609 08/01/19 0716  NA 135 135 133* 136 138 137 136  K 4.4 4.5 4.4 4.7 5.0 5.2* 5.1  CL 101 102 101 101 104 104 103  CO2 26 28 27 25 26 24 25   GLUCOSE 109* 97 104* 90 94 98 91  BUN 11 8 8 10 20  30* 29*  CREATININE 1.99* 1.74* 1.79* 2.31* 3.72* 4.35* 4.18*  CALCIUM 7.8* 7.8* 7.7* 8.1* 8.1* 8.5* 8.3*  MG 2.1 2.1  --  2.0 2.1 2.0  --   PHOS 1.7*  1.6* 1.7* 2.1* 2.9 3.4 4.2 4.1   GFR: Estimated Creatinine Clearance: 11.4 mL/min (A) (by C-G formula based on SCr of 4.18 mg/dL (H)). Liver Function Tests: Recent Labs  Lab 07/29/19 1538 07/30/19 0251 07/31/19 0338 08/01/19 0609 08/01/19 0716  ALBUMIN 1.8* 1.8* 1.7* 1.9* 1.9*   No results for input(s): LIPASE, AMYLASE in the last 168 hours. No results for input(s): AMMONIA in the last 168 hours. Coagulation Profile: No results for input(s): INR, PROTIME in the last 168 hours. Cardiac Enzymes: No results for input(s): CKTOTAL, CKMB, CKMBINDEX, TROPONINI in the last 168 hours. BNP (last 3 results) No results for input(s): PROBNP in the last 8760  hours. HbA1C: No results for input(s): HGBA1C in the last 72 hours. CBG: Recent Labs  Lab 07/31/19 0651 07/31/19 1137 07/31/19 1639 07/31/19 2122 08/01/19 1133  GLUCAP 108* 89 101* 106* 76   Lipid Profile: No results for input(s): CHOL, HDL, LDLCALC, TRIG, CHOLHDL, LDLDIRECT in the last 72 hours. Thyroid Function Tests: No results for input(s): TSH, T4TOTAL, FREET4, T3FREE, THYROIDAB in the last 72 hours. Anemia Panel: No results for input(s): VITAMINB12, FOLATE, FERRITIN, TIBC, IRON, RETICCTPCT in the last 72 hours. Urine analysis:    Component Value Date/Time   COLORURINE YELLOW 07/22/2019 1038   APPEARANCEUR TURBID (A) 07/22/2019 1038   LABSPEC >1.030 (H) 07/22/2019 1038   PHURINE 6.0 07/22/2019 1038   GLUCOSEU NEGATIVE 07/22/2019 1038   HGBUR SMALL (A) 07/22/2019 1038   BILIRUBINUR SMALL (A) 07/22/2019 1038   KETONESUR TRACE (A) 07/22/2019 1038   PROTEINUR 100 (A) 07/22/2019 1038   UROBILINOGEN 0.2 01/29/2007 1500   NITRITE NEGATIVE 07/22/2019 1038   LEUKOCYTESUR MODERATE (A) 07/22/2019 1038   Sepsis Labs: @LABRCNTIP (procalcitonin:4,lacticidven:4) )No results found for this or any previous visit (from the past 240 hour(s)).       Radiology Studies: No results found.    Scheduled Meds: . allopurinol  100 mg Oral Daily  . amiodarone  200 mg Oral Daily  . apixaban  5 mg Oral BID  . atorvastatin  40 mg Oral QHS  . B-complex with vitamin C  1 tablet Oral Daily  . Chlorhexidine Gluconate Cloth  6 each Topical Daily  . darbepoetin (ARANESP) injection - NON-DIALYSIS  150 mcg Subcutaneous Q Thu-1800  . docusate sodium  100 mg Oral Daily  . feeding supplement (ENSURE ENLIVE)  237 mL Oral BID WC  . feeding supplement (PRO-STAT SUGAR FREE 64)  30 mL Oral BID  . umeclidinium  bromide  1 puff Inhalation Daily   And  . fluticasone furoate-vilanterol  1 puff Inhalation Daily  . heparin      . insulin aspart  0-9 Units Subcutaneous TID WC  . insulin glargine  5 Units  Subcutaneous QHS  . metoprolol tartrate  25 mg Oral BID  . pantoprazole  40 mg Oral BID AC  . sertraline  50 mg Oral Daily   Continuous Infusions: . sodium chloride Stopped (07/29/19 1306)  . phenylephrine (NEO-SYNEPHRINE) Adult infusion Stopped (07/28/19 0343)     LOS: 14 days      Debbe Odea, MD [facility] Pager: [email or web address] Password [email or web address] 08/01/2019, 1:17 PM

## 2019-08-01 NOTE — Progress Notes (Signed)
Pt has her home BIPAP Trilogy in AVAPS mode. Set Vt of 350 EPAP 8. Pt can place herself on without difficulty when ready to go to sleep. Pt respiratory status is stable at this time. RT will continue to monitor.

## 2019-08-02 ENCOUNTER — Inpatient Hospital Stay (HOSPITAL_COMMUNITY): Payer: Medicare Other

## 2019-08-02 DIAGNOSIS — N186 End stage renal disease: Secondary | ICD-10-CM

## 2019-08-02 LAB — RENAL FUNCTION PANEL
Albumin: 1.8 g/dL — ABNORMAL LOW (ref 3.5–5.0)
Anion gap: 8 (ref 5–15)
BUN: 20 mg/dL (ref 8–23)
CO2: 28 mmol/L (ref 22–32)
Calcium: 8 mg/dL — ABNORMAL LOW (ref 8.9–10.3)
Chloride: 99 mmol/L (ref 98–111)
Creatinine, Ser: 2.68 mg/dL — ABNORMAL HIGH (ref 0.44–1.00)
GFR calc Af Amer: 20 mL/min — ABNORMAL LOW (ref >60)
GFR calc non Af Amer: 17 mL/min — ABNORMAL LOW (ref >60)
Glucose, Bld: 98 mg/dL (ref 70–99)
Phosphorus: 3.2 mg/dL (ref 2.5–4.6)
Potassium: 4.2 mmol/L (ref 3.5–5.1)
Sodium: 135 mmol/L (ref 135–145)

## 2019-08-02 LAB — CBC
HCT: 28.8 % — ABNORMAL LOW (ref 36.0–46.0)
Hemoglobin: 9.2 g/dL — ABNORMAL LOW (ref 12.0–15.0)
MCH: 28.8 pg (ref 26.0–34.0)
MCHC: 31.9 g/dL (ref 30.0–36.0)
MCV: 90 fL (ref 80.0–100.0)
Platelets: 242 10*3/uL (ref 150–400)
RBC: 3.2 MIL/uL — ABNORMAL LOW (ref 3.87–5.11)
RDW: 19.4 % — ABNORMAL HIGH (ref 11.5–15.5)
WBC: 11.7 10*3/uL — ABNORMAL HIGH (ref 4.0–10.5)
nRBC: 0.3 % — ABNORMAL HIGH (ref 0.0–0.2)

## 2019-08-02 LAB — HEPATITIS B E ANTIGEN: Hep B E Ag: NEGATIVE

## 2019-08-02 LAB — MAGNESIUM: Magnesium: 1.8 mg/dL (ref 1.7–2.4)

## 2019-08-02 LAB — GLUCOSE, CAPILLARY
Glucose-Capillary: 106 mg/dL — ABNORMAL HIGH (ref 70–99)
Glucose-Capillary: 134 mg/dL — ABNORMAL HIGH (ref 70–99)
Glucose-Capillary: 123 mg/dL — ABNORMAL HIGH (ref 70–99)
Glucose-Capillary: 154 mg/dL — ABNORMAL HIGH (ref 70–99)

## 2019-08-02 LAB — HEPATITIS B SURFACE ANTIBODY, QUANTITATIVE: Hep B S AB Quant (Post): <3.1 m[IU]/mL — ABNORMAL LOW (ref >9.9)

## 2019-08-02 NOTE — Progress Notes (Signed)
Physical Therapy Treatment Patient Details Name: [name] MRN: [id number] DOB: [birth year] Today's Date: 08/02/2019    History of Present Illness 73 yo F with a history of diastolic heart failure, CKD stage IIIb, COPD, chronic respiratory failure, obesity, OSA, Afib (on chronic anticoagulation), GERD, hiatal hernia, gout, DM2, and chronic anemia who presented to [facility] on 12/12 after an episode of coffee ground emesis, nausea and vomiting. She was also started on ciprofloaxinc for a UTI. developed afib with RVR, started on amiodarone, and has had progressive renal failure, and has been anuric the last 24 hours. Pt off CRRT on 12/24.    PT Comments    Focus of session became transfer to/from [facility] as pt needed to have an urgent bowel movement. She required assistance for transfers but was more successful and safer with the RW. Overall appeared very effortful and pt was too fatigued for further activity by end of session. Will continue to follow.     Follow Up Recommendations  SNF;Supervision/Assistance - 24 hour(may progress quickly toward baseline)     Equipment Recommendations  None recommended by PT(defer to post-acute setting)    Recommendations for Other Services       Precautions / Restrictions Precautions Precautions: Fall Restrictions Weight Bearing Restrictions: No    Mobility  Bed Mobility Overal bed mobility: Needs Assistance Bed Mobility: Supine to Sit     Supine to sit: Supervision Sit to supine: Min guard   General bed mobility comments: Increased time and effort but able to transition to/from EOB without assistance.   Transfers Overall transfer level: Needs assistance Equipment used: Rolling walker (2 wheeled);1 person hand held assist Transfers: Sit to/from Stand Sit to Stand: Min guard Stand pivot transfers: Min assist;Min guard       General transfer comment: With RW, min guard assist required. Without RW, min assist required. Very slow  and effortful. Flexed trunk without RW as well.   Ambulation/Gait             General Gait Details: Unable to progress due to fatigue after BSC transfer.    Stairs             Wheelchair Mobility    Modified Rankin (Stroke Patients Only)       Balance Overall balance assessment: Needs assistance Sitting-balance support: Bilateral upper extremity supported;Feet unsupported Sitting balance-Leahy Scale: Good Sitting balance - Comments: supervision   Standing balance support: Bilateral upper extremity supported Standing balance-Leahy Scale: Fair Standing balance comment: minG with BUe support of RW                            Cognition Arousal/Alertness: Awake/alert Behavior During Therapy: WFL for tasks assessed/performed Overall Cognitive Status: Within Functional Limits for tasks assessed                                        Exercises      General Comments        Pertinent Vitals/Pain Pain Assessment: No/denies pain    Home Living                      Prior Function            PT Goals (current goals can now be found in the care plan section) Acute Rehab PT Goals Patient Stated Goal: To return  to baseline PT Goal Formulation: With patient Time For Goal Achievement: 08/12/19 Potential to Achieve Goals: Good Progress towards PT goals: Progressing toward goals    Frequency    Min 3X/week      PT Plan Current plan remains appropriate    Co-evaluation              AM-PAC PT "6 Clicks" Mobility   Outcome Measure  Help needed turning from your back to your side while in a flat bed without using bedrails?: None Help needed moving from lying on your back to sitting on the side of a flat bed without using bedrails?: None Help needed moving to and from a bed to a chair (including a wheelchair)?: A Little Help needed standing up from a chair using your arms (e.g., wheelchair or bedside chair)?: A  Little Help needed to walk in hospital room?: A Little Help needed climbing 3-5 steps with a railing? : A Lot 6 Click Score: 19    End of Session Equipment Utilized During Treatment: Gait belt Activity Tolerance: Patient tolerated treatment well Patient left: in bed;with call bell/phone within reach Nurse Communication: Mobility status(Needs purewick) PT Visit Diagnosis: Muscle weakness (generalized) (M62.81)     Time: 6004-5997 PT Time Calculation (min) (ACUTE ONLY): 25 min  Charges:  $Gait Training: 23-37 mins                     [name], PT, DPT Acute Rehabilitation Services Pager: [phone] Office: [phone]    [name] 08/02/2019, 3:58 PM

## 2019-08-02 NOTE — Progress Notes (Signed)
VASCULAR LAB PRELIMINARY  PRELIMINARY  PRELIMINARY  PRELIMINARY  Upper extremity vein mapping completed.    Preliminary report:  See CV proc for preliminary results.   Rhenda [place], RVT 08/02/2019, 2:47 PM

## 2019-08-02 NOTE — Progress Notes (Signed)
PROGRESS NOTE    [name]   SAY:[id number]  DOB: Jan 03, 1946  DOA: 07/17/2019 PCP: Rita Mealy, MD   Brief Narrative:  [name] 73 yo F with a history of diastolic heart failure, CKD stage IIIb, COPD, chronic respiratory failure, obesity, OSA, Afib (on chronic anticoagulation), GERD, hiatal hernia, gout, DM2, and chronic anemia who presented to [facility] on 12/12 after an episode of coffee ground emesis, nausea and vomiting.  She had an EGD that showed esophagitis but no active bleeding ulcers.  She was also started on ciprofloaxin for a UTI.  During her hospital stay, she developed Afib with RVR, was started on amiodarone, and has developed progressive renal failure with anuria.  She was transferred from [facility] to [facility] on 12/20 for consideration for CRRT.  12/14 EGD: esophagitis, Schatzki ring dilation 12/15 Afib with RVR: started on amiodarone 12/16: AKI on CKD, renal consulted initially 12/22: started on neo gtt early this am 07/28/2019-on neo as of 0400 this am 2/2 hypotension and inability to pull volume with crrt. Unsure if needed to pull volume with pt on baseline oxygen, without pulm edema or peripheral edema. See wt's are up but unsure if accurate. Ordered echo to determine LV/RV function and help guide management.   Subjective: No complaints today.     Assessment & Plan:   Principal Problem:   Coffee ground emesis-  Hiatal hernia with GERD and esophagitis - EGD 12/19> -Mild erosive reflux esophagitis. - Mild Schatzki                            ring. -Dilated. Large hiatal hernia. Erythematous                            gastric mucosa.                           - Normal duodenal bulb and second portion of the                            duodenum.                           - No specimens collected. - recommendations > Protonix BID, Eliquis can be resumed on 12/20- Eliquis was resumed on 12/25  Active Problems:  A-fib with RVR - cardiology has  been following - on Lopressor, 25 mg BID, Amiodarone 200 mg and Eliquis    OSA on CPAP - using her CPAP from home  Acute renal failure superimposed on stage 4 chronic kidney disease  Chronic diastolic CHF (congestive heart failure)   - TDC placed and dialysis started- remains anuric with rising Cr - awaiting to be clipped   HLD (hyperlipidemia) - cont Lipitor  Type 2 diabetes with nephropathy  - on Lantus and Novolog SSI- sugars well controlled in 80-110 range  UTI- enterobacter aerogenes - has completed treatment    Obesity, Class III, BMI 40-49.9 (morbid obesity)  Body mass index is 39.54 kg/m.    Chronic respiratory failure with hypoxia    COPD (chronic obstructive pulmonary disease)  - cont Nebs and inhalers    Anemia of chronic disease - cont Anarnesp    Time spent in minutes: 35 DVT prophylaxis: Eliquis Code Status: Full code Family Communication:  Disposition Plan: cont dialysis per nephro- PT recommend SNF vs 24 hr supervision Consultants:  Cardiology Nephrology GI Procedures:  EGD 2/14 07/25/19> Rt femoral triple lumen catheter  07/29/19> L IR tunneled HD cath  2 D ECHO 1. Left ventricular ejection fraction, by visual estimation, is 55 to 60%. The left ventricle has normal function. There is mildly increased left ventricular hypertrophy. 2. Left ventricular diastolic function could not be evaluated. 3. The left ventricle has no regional wall motion abnormalities. 4. Global right ventricle has normal systolic function.The right ventricular size is normal. No increase in right ventricular wall thickness. 5. Left atrial size was moderately dilated. 6. Right atrial size was mildly dilated. 7. Mild mitral annular calcification. 8. The mitral valve is degenerative. Trivial mitral valve regurgitation. 9. The tricuspid valve is grossly normal. Tricuspid valve regurgitation moderate. 10. The tricuspid valve was normal in structure. Tricuspid valve  regurgitation moderate. 11. The aortic valve is grossly normal. Aortic valve regurgitation is not visualized. 12. Moderately elevated pulmonary artery systolic pressure. 13. The tricuspid regurgitant velocity is 3.12 m/s, and with an assumed right atrial pressure of 15 mmHg, the estimated right ventricular systolic pressure is moderately elevated at 54.0 mmHg. 14. The inferior vena cava is dilated in size with <50% respiratory variability, suggesting right atrial pressure of 15 mmHg. Antimicrobials:  Anti-infectives (From admission, onward)   Start     Dose/Rate Route Frequency Ordered Stop   07/30/19 1015  ciprofloxacin (CIPRO) IVPB 400 mg     400 mg 200 mL/hr over 60 Minutes Intravenous Every 24 hours 07/29/19 1404 07/30/19 1101   07/29/19 1030  ceFAZolin (ANCEF) IVPB 2g/100 mL premix     2 g 200 mL/hr over 30 Minutes Intravenous To Radiology 07/29/19 1004 07/29/19 1531   07/25/19 2200  ciprofloxacin (CIPRO) IVPB 400 mg  Status:  Discontinued     400 mg 200 mL/hr over 60 Minutes Intravenous Every 12 hours 07/25/19 1527 07/29/19 1404   07/22/19 1330  ciprofloxacin (CIPRO) tablet 500 mg  Status:  Discontinued     500 mg Oral Daily 07/22/19 1329 07/25/19 1527   07/22/19 1100  cefTRIAXone (ROCEPHIN) 2 g in sodium chloride 0.9 % 100 mL IVPB  Status:  Discontinued     2 g 200 mL/hr over 30 Minutes Intravenous Every 24 hours 07/22/19 1045 07/22/19 1334       Objective: Vitals:   08/01/19 1658 08/01/19 2113 08/02/19 0453 08/02/19 1012  BP: 134/62 (!) 141/50 (!) 126/37 (!) 148/52  Pulse: 75 73 83 90  Resp: 18 16 16 18   Temp: 98.1 F (36.7 C) 98.6 F (37 C) 98.2 F (36.8 C) 99.1 F (37.3 C)  TempSrc: Oral Oral Oral Oral  SpO2: 92% 92% 91% 91%  Weight:      Height:        Intake/Output Summary (Last 24 hours) at 08/02/2019 1339 Last data filed at 08/02/2019 1300 Gross per 24 hour  Intake 960 ml  Output 1 ml  Net 959 ml   Filed Weights   08/01/19 0446 08/01/19 0645 08/01/19  1030  Weight: 90.3 kg 90.8 kg 88.8 kg    Examination: General exam: Appears comfortable  HEENT: PERRLA, oral mucosa moist, no sclera icterus or thrush Respiratory system: Clear to auscultation. Respiratory effort normal. Cardiovascular system: S1 & S2 heard,  No murmurs  Gastrointestinal system: Abdomen soft, non-tender, nondistended. Normal bowel sounds   Central nervous system: Alert and oriented. No focal neurological deficits. Extremities: No cyanosis, clubbing or edema  Skin: No rashes or ulcers Psychiatry:  Mood & affect appropriate.   Data Reviewed: I have personally reviewed following labs and imaging studies  CBC: Recent Labs  Lab 07/30/19 0251 07/31/19 0338 08/01/19 0609 08/01/19 0716 08/02/19 0415  WBC 9.3 8.8 9.6 9.8 11.7*  HGB 8.1* 8.0* 9.5* 9.0* 9.2*  HCT 25.7* 25.6* 30.0* 28.3* 28.8*  MCV 89.5 90.5 90.6 90.4 90.0  PLT 190 178 215 205 161   Basic Metabolic Panel: Recent Labs  Lab 07/29/19 0300 07/30/19 0251 07/31/19 0338 08/01/19 0609 08/01/19 0716 08/02/19 0415  NA 135 136 138 137 136 135  K 4.5 4.7 5.0 5.2* 5.1 4.2  CL 102 101 104 104 103 99  CO2 28 25 26 24 25 28   GLUCOSE 97 90 94 98 91 98  BUN 8 10 20  30* 29* 20  CREATININE 1.74* 2.31* 3.72* 4.35* 4.18* 2.68*  CALCIUM 7.8* 8.1* 8.1* 8.5* 8.3* 8.0*  MG 2.1 2.0 2.1 2.0  --  1.8  PHOS 1.7* 2.9 3.4 4.2 4.1 3.2   GFR: Estimated Creatinine Clearance: 17.9 mL/min (A) (by C-G formula based on SCr of 2.68 mg/dL (H)). Liver Function Tests: Recent Labs  Lab 07/30/19 0251 07/31/19 0338 08/01/19 0609 08/01/19 0716 08/02/19 0415  ALBUMIN 1.8* 1.7* 1.9* 1.9* 1.8*   No results for input(s): LIPASE, AMYLASE in the last 168 hours. No results for input(s): AMMONIA in the last 168 hours. Coagulation Profile: No results for input(s): INR, PROTIME in the last 168 hours. Cardiac Enzymes: No results for input(s): CKTOTAL, CKMB, CKMBINDEX, TROPONINI in the last 168 hours. BNP (last 3 results) No results  for input(s): PROBNP in the last 8760 hours. HbA1C: No results for input(s): HGBA1C in the last 72 hours. CBG: Recent Labs  Lab 07/31/19 2122 08/01/19 1133 08/01/19 1655 08/02/19 0657 08/02/19 1137  GLUCAP 106* 76 90 106* 134*   Lipid Profile: No results for input(s): CHOL, HDL, LDLCALC, TRIG, CHOLHDL, LDLDIRECT in the last 72 hours. Thyroid Function Tests: No results for input(s): TSH, T4TOTAL, FREET4, T3FREE, THYROIDAB in the last 72 hours. Anemia Panel: No results for input(s): VITAMINB12, FOLATE, FERRITIN, TIBC, IRON, RETICCTPCT in the last 72 hours. Urine analysis:    Component Value Date/Time   COLORURINE YELLOW 07/22/2019 1038   APPEARANCEUR TURBID (A) 07/22/2019 1038   LABSPEC >1.030 (H) 07/22/2019 1038   PHURINE 6.0 07/22/2019 1038   GLUCOSEU NEGATIVE 07/22/2019 1038   HGBUR SMALL (A) 07/22/2019 1038   BILIRUBINUR SMALL (A) 07/22/2019 1038   KETONESUR TRACE (A) 07/22/2019 1038   PROTEINUR 100 (A) 07/22/2019 1038   UROBILINOGEN 0.2 01/29/2007 1500   NITRITE NEGATIVE 07/22/2019 1038   LEUKOCYTESUR MODERATE (A) 07/22/2019 1038   Sepsis Labs: @LABRCNTIP (procalcitonin:4,lacticidven:4) )No results found for this or any previous visit (from the past 240 hour(s)).       Radiology Studies: No results found.    Scheduled Meds: . allopurinol  100 mg Oral Daily  . amiodarone  200 mg Oral Daily  . apixaban  5 mg Oral BID  . atorvastatin  40 mg Oral QHS  . B-complex with vitamin C  1 tablet Oral Daily  . Chlorhexidine Gluconate Cloth  6 each Topical Daily  . darbepoetin (ARANESP) injection - NON-DIALYSIS  150 mcg Subcutaneous Q Thu-1800  . docusate sodium  100 mg Oral Daily  . feeding supplement (ENSURE ENLIVE)  237 mL Oral BID WC  . feeding supplement (PRO-STAT SUGAR FREE 64)  30 mL Oral BID  . umeclidinium bromide  1  puff Inhalation Daily   And  . fluticasone furoate-vilanterol  1 puff Inhalation Daily  . insulin aspart  0-9 Units Subcutaneous TID WC  .  insulin glargine  5 Units Subcutaneous QHS  . metoprolol tartrate  25 mg Oral BID  . pantoprazole  40 mg Oral BID AC  . sertraline  50 mg Oral Daily   Continuous Infusions: . sodium chloride Stopped (07/29/19 1306)     LOS: 15 days      Debbe Odea, MD [facility] Pager: [email or web address] Password [email or web address] 08/02/2019, 1:39 PM

## 2019-08-02 NOTE — Progress Notes (Addendum)
Progress Note  Patient Name: [name] Date of Encounter: 08/02/2019  Primary Cardiologist: [name], MD   Subjective   No chest pain or SOB  Inpatient Medications    Scheduled Meds: . allopurinol  100 mg Oral Daily  . amiodarone  200 mg Oral Daily  . apixaban  5 mg Oral BID  . atorvastatin  40 mg Oral QHS  . B-complex with vitamin C  1 tablet Oral Daily  . Chlorhexidine Gluconate Cloth  6 each Topical Daily  . darbepoetin (ARANESP) injection - NON-DIALYSIS  150 mcg Subcutaneous Q Thu-1800  . docusate sodium  100 mg Oral Daily  . feeding supplement (ENSURE ENLIVE)  237 mL Oral BID WC  . feeding supplement (PRO-STAT SUGAR FREE 64)  30 mL Oral BID  . umeclidinium bromide  1 puff Inhalation Daily   And  . fluticasone furoate-vilanterol  1 puff Inhalation Daily  . insulin aspart  0-9 Units Subcutaneous TID WC  . insulin glargine  5 Units Subcutaneous QHS  . metoprolol tartrate  25 mg Oral BID  . pantoprazole  40 mg Oral BID AC  . sertraline  50 mg Oral Daily   Continuous Infusions: . sodium chloride Stopped (07/29/19 1306)   PRN Meds: sodium chloride, acetaminophen **OR** acetaminophen, albuterol   Vital Signs    Vitals:   08/01/19 1132 08/01/19 1658 08/01/19 2113 08/02/19 0453  BP: 139/66 134/62 (!) 141/50 (!) 126/37  Pulse: 75 75 73 83  Resp: 18 18 16 16   Temp: 98.2 F (36.8 C) 98.1 F (36.7 C) 98.6 F (37 C) 98.2 F (36.8 C)  TempSrc: Oral Oral Oral Oral  SpO2: 94% 92% 92% 91%  Weight:      Height:        Intake/Output Summary (Last 24 hours) at 08/02/2019 0754 Last data filed at 08/01/2019 2300 Gross per 24 hour  Intake 340 ml  Output --  Net 340 ml   Last 3 Weights 08/01/2019 08/01/2019 08/01/2019  Weight (lbs) 195 lb 12.3 oz 200 lb 2.8 oz 199 lb 1.2 oz  Weight (kg) 88.8 kg 90.8 kg 90.3 kg      Telemetry    N/A - Personally Reviewed  ECG    No new - Personally Reviewed  Physical Exam   GEN: No acute distress.   Neck:  No JVD Cardiac: RRR, systolic murmurs, rubs, or gallops.  Respiratory: Clear to auscultation bilaterally. GI: Soft, nontender, non-distended  MS: No edema; No deformity. Neuro:  Nonfocal  Psych: Normal affect   Labs    High Sensitivity Troponin:   Recent Labs  Lab 07/05/19 1753 07/05/19 1955 07/05/19 2157 07/17/19 0940 07/17/19 1042  TROPONINIHS 52* 50* 51* 19* 20*      Chemistry Recent Labs  Lab 08/01/19 0609 08/01/19 0716 08/02/19 0415  NA 137 136 135  K 5.2* 5.1 4.2  CL 104 103 99  CO2 24 25 28   GLUCOSE 98 91 98  BUN 30* 29* 20  CREATININE 4.35* 4.18* 2.68*  CALCIUM 8.5* 8.3* 8.0*  ALBUMIN 1.9* 1.9* 1.8*  GFRNONAA 9* 10* 17*  GFRAA 11* 11* 20*  ANIONGAP 9 8 8      Hematology Recent Labs  Lab 08/01/19 0609 08/01/19 0716 08/02/19 0415  WBC 9.6 9.8 11.7*  RBC 3.31* 3.13* 3.20*  HGB 9.5* 9.0* 9.2*  HCT 30.0* 28.3* 28.8*  MCV 90.6 90.4 90.0  MCH 28.7 28.8 28.8  MCHC 31.7 31.8 31.9  RDW 19.1* 19.3* 19.4*  PLT 215  205 242    BNPNo results for input(s): BNP, PROBNP in the last 168 hours.   DDimer No results for input(s): DDIMER in the last 168 hours.   Radiology    No results found.  Cardiac Studies   Echo 07/27/19  1. Left ventricular ejection fraction, by visual estimation, is 55 to 60%. The left ventricle has normal function. There is mildly increased left ventricular hypertrophy.  2. Left ventricular diastolic function could not be evaluated.  3. The left ventricle has no regional wall motion abnormalities.  4. Global right ventricle has normal systolic function.The right ventricular size is normal. No increase in right ventricular wall thickness.  5. Left atrial size was moderately dilated.  6. Right atrial size was mildly dilated.  7. Mild mitral annular calcification.  8. The mitral valve is degenerative. Trivial mitral valve regurgitation.  9. The tricuspid valve is grossly normal. Tricuspid valve regurgitation moderate. 10. The  tricuspid valve was normal in structure. Tricuspid valve regurgitation moderate. 11. The aortic valve is grossly normal. Aortic valve regurgitation is not visualized. 12. Moderately elevated pulmonary artery systolic pressure. 13. The tricuspid regurgitant velocity is 3.12 m/s, and with an assumed right atrial pressure of 15 mmHg, the estimated right ventricular systolic pressure is moderately elevated at 54.0 mmHg. 14. The inferior vena cava is dilated in size with <50% respiratory variability, suggesting right atrial pressure of 15 mmHg.   Patient Profile     73 y.o. female with a pmh of diastolic heart failure, CKD stage III, COPD, obesity, OSA, Afib on Eliquis, GERD, hiatal hernia, gout, DM2, and chronic anemia who presented to [facility] 12/12 for coffee ground emesis. During hospital stay she went into Afib RVR and was started on amiodarone.   Assessment & Plan    Coffee ground emesis - Patient has hiatal hernia with GERD and esophagitis - EGD showed esophagitis ad no actively bleeding ulcers - Hgb 9.0 > 9.2 - Eliquis was resumed 12/25 - Continue protonix - per IM  Afib RVR - New diagnosis since 06/15/19 admission for CHF exacerbation> was started on Eliquis 5 mg BID - Patient not on telemetry. EKG from last shows NSR. Will check EKG this morning - continue Lopressor 25 mg BID, amiodarone 200 mg daily - CHADSVASC = 5 - Eliquis for anticoagulation  Chronic Diastolic CHF - was on Toprol 75 mg daily and Torsemide 10 mg daily at baseline - Currently on Lopressor 25 mg BID. Torsemide disontinued - Euvolemic on exam  Acute renal failure/Stage 4 - placed on HD>> will likely be HD dependent - neprhrology following - VVS consulted for AVF>> plan to hold Eliquis 48 hours prior to surgery  HLD - Lipitor  COPD - Continue nebs - perIM  OSA on CPAP - per IM  For questions or updates, please contact [facility] Please consult [email or web address] for contact info under         Signed, Cadence Ninfa Meeker, PA-C  08/02/2019, 7:54 AM    Attending Note:   The patient was seen and examined.  Agree with assessment and plan as noted above.  Changes made to the above note as needed.  Patient seen and independently examined with Cadence Kathlen Mody, PA .   We discussed all aspects of the encounter. I agree with the assessment and plan as stated above.  1.   GI bleed.  Has already been restarted on her Eliuqis.   Cont.  Current meds  was in NSR on Dec. 23 ( most recent ECG )  2.  Atrial fib:   Is back in NSR .  Cont current meds.    I have spent a total of 40 minutes with patient reviewing hospital  notes , telemetry, EKGs, labs and examining patient as well as establishing an assessment and plan that was discussed with the patient. > 50% of time was spent in direct patient care.  [facility] will sign off.   Medication Recommendations:  Cont current meds  Other recommendations (labs, testing, etc):    Follow up as an outpatient:  Dr. Inocencio Homes, Brooke Bonito., MD, [facility] 08/02/2019, 1:33 PM [address],  [place] Pager [phone]

## 2019-08-02 NOTE — Progress Notes (Signed)
Patient has BiPAP at bedside. Chamber has water and moved BiPAP within patient's reach. RT will monitor throughout the night as needed.

## 2019-08-02 NOTE — Progress Notes (Signed)
Admit: 07/17/2019 LOS: 38  59F with advanced CKD, AKI and now dialysis dependent in setting of GIB,  Subjective:  . HD yesterday . VVS following for AVF/G eval . Pt w/o complaints this AM . Discussed outpt HD, new ESRD, she agrees to starting    12/27 0701 - 12/28 0700 In: 460 [P.O.:460] Out: 0   Filed Weights   08/01/19 0446 08/01/19 0645 08/01/19 1030  Weight: 90.3 kg 90.8 kg 88.8 kg    Scheduled Meds: . allopurinol  100 mg Oral Daily  . amiodarone  200 mg Oral Daily  . apixaban  5 mg Oral BID  . atorvastatin  40 mg Oral QHS  . B-complex with vitamin C  1 tablet Oral Daily  . Chlorhexidine Gluconate Cloth  6 each Topical Daily  . darbepoetin (ARANESP) injection - NON-DIALYSIS  150 mcg Subcutaneous Q Thu-1800  . docusate sodium  100 mg Oral Daily  . feeding supplement (ENSURE ENLIVE)  237 mL Oral BID WC  . feeding supplement (PRO-STAT SUGAR FREE 64)  30 mL Oral BID  . umeclidinium bromide  1 puff Inhalation Daily   And  . fluticasone furoate-vilanterol  1 puff Inhalation Daily  . insulin aspart  0-9 Units Subcutaneous TID WC  . insulin glargine  5 Units Subcutaneous QHS  . metoprolol tartrate  25 mg Oral BID  . pantoprazole  40 mg Oral BID AC  . sertraline  50 mg Oral Daily   Continuous Infusions: . sodium chloride Stopped (07/29/19 1306)   PRN Meds:.sodium chloride, acetaminophen **OR** acetaminophen, albuterol  Current Labs: reviewed  Results for Rita, [name] (MRN [id number]) as of 08/02/2019 11:10  Ref. Range 07/23/2019 03:51  Saturation Ratios Latest Ref Range: 10.4 - 31.8 % 8 (L)  Ferritin Latest Ref Range: 11 - 307 ng/mL 84    Physical Exam:  Blood pressure (!) 148/52, pulse 90, temperature 99.1 F (37.3 C), temperature source Oral, resp. rate 18, height 4\' 11"  (1.499 m), weight 88.8 kg, SpO2 91 %. Elderly, obese NAD Regular TDC C/D/I Coarse bs/ b/l nl wob No sig LEE  A 1. AoCKD, now likely ESRD 2. UGIB, EGD with esophagitis, schatzki ring,  hiatal hernia 3. AFib hx/o RVR, back on apixaban 4. OSA on CPAP 5. DM2 6. Anemia on ESA; already rec Fereheme 7. Obesity 8. COPD  P . Cont HD on [facility] Schedule: 4h, TDC, 2-3L UF, no heparin, 2K . VVS following for AVF/G . CLIP in process as ESRD . Medication Issues; o Preferred narcotic agents for pain control are hydromorphone, fentanyl, and methadone. Morphine should not be used.  o Baclofen should be avoided o Avoid oral sodium phosphate and magnesium citrate based laxatives / bowel preps    Pearson Grippe MD 08/02/2019, 11:07 AM  Recent Labs  Lab 08/01/19 0609 08/01/19 0716 08/02/19 0415  NA 137 136 135  K 5.2* 5.1 4.2  CL 104 103 99  CO2 24 25 28   GLUCOSE 98 91 98  BUN 30* 29* 20  CREATININE 4.35* 4.18* 2.68*  CALCIUM 8.5* 8.3* 8.0*  PHOS 4.2 4.1 3.2   Recent Labs  Lab 08/01/19 0609 08/01/19 0716 08/02/19 0415  WBC 9.6 9.8 11.7*  HGB 9.5* 9.0* 9.2*  HCT 30.0* 28.3* 28.8*  MCV 90.6 90.4 90.0  PLT 215 205 242

## 2019-08-03 LAB — RENAL FUNCTION PANEL
Albumin: 1.7 g/dL — ABNORMAL LOW (ref 3.5–5.0)
Anion gap: 8 (ref 5–15)
BUN: 32 mg/dL — ABNORMAL HIGH (ref 8–23)
CO2: 26 mmol/L (ref 22–32)
Calcium: 7.9 mg/dL — ABNORMAL LOW (ref 8.9–10.3)
Chloride: 104 mmol/L (ref 98–111)
Creatinine, Ser: 3.29 mg/dL — ABNORMAL HIGH (ref 0.44–1.00)
GFR calc Af Amer: 15 mL/min — ABNORMAL LOW (ref >60)
GFR calc non Af Amer: 13 mL/min — ABNORMAL LOW (ref >60)
Glucose, Bld: 94 mg/dL (ref 70–99)
Phosphorus: 3.9 mg/dL (ref 2.5–4.6)
Potassium: 4.2 mmol/L (ref 3.5–5.1)
Sodium: 138 mmol/L (ref 135–145)

## 2019-08-03 LAB — MAGNESIUM: Magnesium: 1.8 mg/dL (ref 1.7–2.4)

## 2019-08-03 LAB — GLUCOSE, CAPILLARY
Glucose-Capillary: 100 mg/dL — ABNORMAL HIGH (ref 70–99)
Glucose-Capillary: 91 mg/dL (ref 70–99)
Glucose-Capillary: 87 mg/dL (ref 70–99)
Glucose-Capillary: 117 mg/dL — ABNORMAL HIGH (ref 70–99)

## 2019-08-03 LAB — CBC
HCT: 27 % — ABNORMAL LOW (ref 36.0–46.0)
Hemoglobin: 8.5 g/dL — ABNORMAL LOW (ref 12.0–15.0)
MCH: 28.7 pg (ref 26.0–34.0)
MCHC: 31.5 g/dL (ref 30.0–36.0)
MCV: 91.2 fL (ref 80.0–100.0)
Platelets: 222 10*3/uL (ref 150–400)
RBC: 2.96 MIL/uL — ABNORMAL LOW (ref 3.87–5.11)
RDW: 19.9 % — ABNORMAL HIGH (ref 11.5–15.5)
WBC: 11.9 10*3/uL — ABNORMAL HIGH (ref 4.0–10.5)
nRBC: 0 % (ref 0.0–0.2)

## 2019-08-03 MED ORDER — ALTEPLASE 2 MG IJ SOLR: 2.0000 mg | Freq: Once | INTRAMUSCULAR | Status: DC | PRN

## 2019-08-03 MED ORDER — ORAL CARE MOUTH RINSE
15.0000 mL | Freq: Two times a day (BID) | OROMUCOSAL | Status: DC
  Administered 2019-08-04 – 2019-08-11 (×8): 15 mL via OROMUCOSAL

## 2019-08-03 MED ORDER — HEPARIN SODIUM (PORCINE) 1000 UNIT/ML IJ SOLN
INTRAMUSCULAR | Status: AC
  Administered 2019-08-03: 3800 [IU] via INTRAVENOUS_CENTRAL
  Filled 2019-08-03: qty 4

## 2019-08-03 MED ORDER — SODIUM CHLORIDE 0.9% FLUSH
10.0000 mL | INTRAVENOUS | Status: DC | PRN
  Administered 2019-08-03 – 2019-08-08 (×2): 10 mL

## 2019-08-03 MED ORDER — CEFAZOLIN SODIUM-DEXTROSE 1-4 GM/50ML-% IV SOLN
1.0000 g | INTRAVENOUS | Status: DC
  Filled 2019-08-03: qty 50

## 2019-08-03 MED ORDER — PENTAFLUOROPROP-TETRAFLUOROETH EX AERO: 1.0000 "application " | INHALATION_SPRAY | CUTANEOUS | Status: DC | PRN

## 2019-08-03 MED ORDER — SODIUM CHLORIDE 0.9 % IV SOLN: 100.0000 mL | INTRAVENOUS | Status: DC | PRN

## 2019-08-03 MED ORDER — HEPARIN SODIUM (PORCINE) 1000 UNIT/ML DIALYSIS: 1000.0000 [IU] | INTRAMUSCULAR | Status: DC | PRN

## 2019-08-03 MED ORDER — LIDOCAINE HCL (PF) 1 % IJ SOLN: 5.0000 mL | INTRAMUSCULAR | Status: DC | PRN

## 2019-08-03 MED ORDER — RENA-VITE PO TABS
1.0000 | ORAL_TABLET | Freq: Every day | ORAL | Status: DC
  Administered 2019-08-03 – 2019-08-10 (×8): 1 via ORAL
  Filled 2019-08-03 (×8): qty 1

## 2019-08-03 MED ORDER — LIDOCAINE-PRILOCAINE 2.5-2.5 % EX CREA: 1.0000 "application " | TOPICAL_CREAM | CUTANEOUS | Status: DC | PRN

## 2019-08-03 NOTE — Progress Notes (Signed)
[facility]  Subjective  - no complaints   Objective (!) 126/46 80 97.8 F (36.6 C) (Oral) 16 91%  Intake/Output Summary (Last 24 hours) at 08/03/2019 0816 Last data filed at 08/03/2019 0600 Gross per 24 hour  Intake 720 ml  Output 1 ml  Net 719 ml   Left radial and brachial pulse palpable Left IJ tunnel catheter  Laboratory Lab Results: Recent Labs    08/02/19 0415 08/03/19 0500  WBC 11.7* 11.9*  HGB 9.2* 8.5*  HCT 28.8* 27.0*  PLT 242 222   BMET Recent Labs    08/02/19 0415 08/03/19 0500  NA 135 138  K 4.2 4.2  CL 99 104  CO2 28 26  GLUCOSE 98 94  BUN 20 32*  CREATININE 2.68* 3.29*  CALCIUM 8.0* 7.9*    COAG Lab Results  Component Value Date   INR 1.0 10/25/2008   No results found for: PTT  Assessment/Planning:  38 old female that vascular surgery was consulted for permanent AV fistula access.  After review of vein mapping appears to have a basilic vein in the left arm given she is right-handed.  We will schedule for Thursday with Dr. Donzetta Matters.  Please continue to hold her Eliquis.  Marty Heck 08/03/2019 8:16 AM --

## 2019-08-03 NOTE — Progress Notes (Signed)
PROGRESS NOTE    [name]   ION:[id number]  DOB: [birth year]  DOA: 07/17/2019 PCP: [name], MD   Brief Narrative:  [name] 73 yo F with a history of diastolic heart failure, CKD stage IIIb, COPD, chronic respiratory failure, obesity, OSA, Afib (on chronic anticoagulation), GERD, hiatal hernia, gout, DM2, and chronic anemia who presented to [facility] on 12/12 after an episode of coffee ground emesis, nausea and vomiting.  She had an EGD that showed esophagitis but no active bleeding ulcers.  She was also started on ciprofloaxin for a UTI.  During her hospital stay, she developed Afib with RVR, was started on amiodarone, and has developed progressive renal failure with anuria.  She was transferred from [facility] to [facility] on 12/20 for consideration for CRRT.  12/14 EGD: esophagitis, Schatzki ring dilation 12/15 Afib with RVR: started on amiodarone 12/16: AKI on CKD, renal consulted initially 12/22: started on neo gtt early this am 07/28/2019-on neo as of 0400 this am 2/2 hypotension and inability to pull volume with crrt. Unsure if needed to pull volume with pt on baseline oxygen, without pulm edema or peripheral edema. See wt's are up but unsure if accurate. Ordered echo to determine LV/RV function and help guide management.   Subjective: She has no complaints.     Assessment & Plan:   Principal Problem:   Coffee ground emesis-  Hiatal hernia with GERD and esophagitis - EGD 12/19> -Mild erosive reflux esophagitis. - Mild Schatzki                            ring. -Dilated. Large hiatal hernia. Erythematous                            gastric mucosa.                           - Normal duodenal bulb and second portion of the                            duodenum.                           - No specimens collected. - recommendations > Protonix BID, Eliquis can be resumed on 12/20- Eliquis was resumed on 12/25  Active Problems:  A-fib with RVR - cardiology has  been following - on Lopressor, 25 mg BID, Amiodarone 200 mg and Eliquis    OSA on CPAP - using her CPAP from home  Acute renal failure superimposed on stage 4 chronic kidney disease  Chronic diastolic CHF (congestive heart failure)   - TDC placed and dialysis started- remains anuric with rising Cr -Vasc surgery plans for AV fistula on Thursday- hold Eliquis - awaiting to be clipped   HLD (hyperlipidemia) - cont Lipitor  Type 2 diabetes with nephropathy  - on Lantus and Novolog SSI- sugars well controlled in 80-110 range  UTI- enterobacter aerogenes - has completed treatment    Obesity, Class III, BMI 40-49.9 (morbid obesity)  Body mass index is 38.78 kg/m.    Chronic respiratory failure with hypoxia    COPD (chronic obstructive pulmonary disease)  - cont Nebs and inhalers    Anemia of chronic disease - cont Anarnesp    Time spent in  minutes: 35 DVT prophylaxis: Eliquis being held today- SCDs ordered for now Code Status: Full code Family Communication:  Disposition Plan: cont dialysis per nephro- PT recommend SNF vs 24 hr supervision Consultants:  Cardiology Nephrology GI Procedures:  EGD 2/14 07/25/19> Rt femoral triple lumen catheter  07/29/19> L IR tunneled HD cath  2 D ECHO 1. Left ventricular ejection fraction, by visual estimation, is 55 to 60%. The left ventricle has normal function. There is mildly increased left ventricular hypertrophy. 2. Left ventricular diastolic function could not be evaluated. 3. The left ventricle has no regional wall motion abnormalities. 4. Global right ventricle has normal systolic function.The right ventricular size is normal. No increase in right ventricular wall thickness. 5. Left atrial size was moderately dilated. 6. Right atrial size was mildly dilated. 7. Mild mitral annular calcification. 8. The mitral valve is degenerative. Trivial mitral valve regurgitation. 9. The tricuspid valve is grossly normal. Tricuspid valve  regurgitation moderate. 10. The tricuspid valve was normal in structure. Tricuspid valve regurgitation moderate. 11. The aortic valve is grossly normal. Aortic valve regurgitation is not visualized. 12. Moderately elevated pulmonary artery systolic pressure. 13. The tricuspid regurgitant velocity is 3.12 m/s, and with an assumed right atrial pressure of 15 mmHg, the estimated right ventricular systolic pressure is moderately elevated at 54.0 mmHg. 14. The inferior vena cava is dilated in size with <50% respiratory variability, suggesting right atrial pressure of 15 mmHg. Antimicrobials:  Anti-infectives (From admission, onward)   Start     Dose/Rate Route Frequency Ordered Stop   08/04/19 0600  ceFAZolin (ANCEF) IVPB 1 g/50 mL premix     1 g 100 mL/hr over 30 Minutes Intravenous To Short Stay 08/03/19 0820 08/05/19 0600   07/30/19 1015  ciprofloxacin (CIPRO) IVPB 400 mg     400 mg 200 mL/hr over 60 Minutes Intravenous Every 24 hours 07/29/19 1404 07/30/19 1101   07/29/19 1030  ceFAZolin (ANCEF) IVPB 2g/100 mL premix     2 g 200 mL/hr over 30 Minutes Intravenous To Radiology 07/29/19 1004 07/29/19 1531   07/25/19 2200  ciprofloxacin (CIPRO) IVPB 400 mg  Status:  Discontinued     400 mg 200 mL/hr over 60 Minutes Intravenous Every 12 hours 07/25/19 1527 07/29/19 1404   07/22/19 1330  ciprofloxacin (CIPRO) tablet 500 mg  Status:  Discontinued     500 mg Oral Daily 07/22/19 1329 07/25/19 1527   07/22/19 1100  cefTRIAXone (ROCEPHIN) 2 g in sodium chloride 0.9 % 100 mL IVPB  Status:  Discontinued     2 g 200 mL/hr over 30 Minutes Intravenous Every 24 hours 07/22/19 1045 07/22/19 1334       Objective: Vitals:   08/03/19 1300 08/03/19 1330 08/03/19 1400 08/03/19 1430  BP: (!) 112/47 (!) 108/43 (!) 76/37 (!) 104/46  Pulse: 82 87 81 86  Resp:      Temp:      TempSrc:      SpO2:      Weight:      Height:        Intake/Output Summary (Last 24 hours) at 08/03/2019 1508 Last data filed  at 08/03/2019 0900 Gross per 24 hour  Intake 540 ml  Output 0 ml  Net 540 ml   Filed Weights   08/01/19 1030 08/02/19 2105 08/03/19 1220  Weight: 88.8 kg 88.8 kg 87.1 kg    Examination: General exam: Appears comfortable  HEENT: PERRLA, oral mucosa moist, no sclera icterus or thrush Respiratory system: Clear to auscultation. Respiratory  effort normal. Cardiovascular system: S1 & S2 heard,  No murmurs  Gastrointestinal system: Abdomen soft, non-tender, nondistended. Normal bowel sounds   Central nervous system: Alert and oriented. No focal neurological deficits. Extremities: No cyanosis, clubbing or edema Skin: No rashes or ulcers Psychiatry:  appears depressed  Data Reviewed: I have personally reviewed following labs and imaging studies  CBC: Recent Labs  Lab 07/31/19 0338 08/01/19 0609 08/01/19 0716 08/02/19 0415 08/03/19 0500  WBC 8.8 9.6 9.8 11.7* 11.9*  HGB 8.0* 9.5* 9.0* 9.2* 8.5*  HCT 25.6* 30.0* 28.3* 28.8* 27.0*  MCV 90.5 90.6 90.4 90.0 91.2  PLT 178 215 205 242 606   Basic Metabolic Panel: Recent Labs  Lab 07/30/19 0251 07/31/19 0338 08/01/19 0609 08/01/19 0716 08/02/19 0415 08/03/19 0500  NA 136 138 137 136 135 138  K 4.7 5.0 5.2* 5.1 4.2 4.2  CL 101 104 104 103 99 104  CO2 25 26 24 25 28 26   GLUCOSE 90 94 98 91 98 94  BUN 10 20 30* 29* 20 32*  CREATININE 2.31* 3.72* 4.35* 4.18* 2.68* 3.29*  CALCIUM 8.1* 8.1* 8.5* 8.3* 8.0* 7.9*  MG 2.0 2.1 2.0  --  1.8 1.8  PHOS 2.9 3.4 4.2 4.1 3.2 3.9   GFR: Estimated Creatinine Clearance: 14.4 mL/min (A) (by C-G formula based on SCr of 3.29 mg/dL (H)). Liver Function Tests: Recent Labs  Lab 07/31/19 0338 08/01/19 0609 08/01/19 0716 08/02/19 0415 08/03/19 0500  ALBUMIN 1.7* 1.9* 1.9* 1.8* 1.7*   No results for input(s): LIPASE, AMYLASE in the last 168 hours. No results for input(s): AMMONIA in the last 168 hours. Coagulation Profile: No results for input(s): INR, PROTIME in the last 168  hours. Cardiac Enzymes: No results for input(s): CKTOTAL, CKMB, CKMBINDEX, TROPONINI in the last 168 hours. BNP (last 3 results) No results for input(s): PROBNP in the last 8760 hours. HbA1C: No results for input(s): HGBA1C in the last 72 hours. CBG: Recent Labs  Lab 08/02/19 1137 08/02/19 1620 08/02/19 2103 08/03/19 0657 08/03/19 1125  GLUCAP 134* 123* 154* 100* 91   Lipid Profile: No results for input(s): CHOL, HDL, LDLCALC, TRIG, CHOLHDL, LDLDIRECT in the last 72 hours. Thyroid Function Tests: No results for input(s): TSH, T4TOTAL, FREET4, T3FREE, THYROIDAB in the last 72 hours. Anemia Panel: No results for input(s): VITAMINB12, FOLATE, FERRITIN, TIBC, IRON, RETICCTPCT in the last 72 hours. Urine analysis:    Component Value Date/Time   COLORURINE YELLOW 07/22/2019 1038   APPEARANCEUR TURBID (A) 07/22/2019 1038   LABSPEC >1.030 (H) 07/22/2019 1038   PHURINE 6.0 07/22/2019 1038   GLUCOSEU NEGATIVE 07/22/2019 1038   HGBUR SMALL (A) 07/22/2019 1038   BILIRUBINUR SMALL (A) 07/22/2019 1038   KETONESUR TRACE (A) 07/22/2019 1038   PROTEINUR 100 (A) 07/22/2019 1038   UROBILINOGEN 0.2 01/29/2007 1500   NITRITE NEGATIVE 07/22/2019 1038   LEUKOCYTESUR MODERATE (A) 07/22/2019 1038   Sepsis Labs: @LABRCNTIP (procalcitonin:4,lacticidven:4) )No results found for this or any previous visit (from the past 240 hour(s)).       Radiology Studies: VAS [place] UPPER EXT VEIN MAPPING (PRE-OP AVF)  Result Date: 08/03/2019 UPPER EXTREMITY VEIN MAPPING  Indications: Pre-access. Limitations: Body habitus Comparison Study: No prior study Performing Technologist: Sharion Dove RVS  Examination Guidelines: A complete evaluation includes B-mode imaging, spectral Doppler, color Doppler, and power Doppler as needed of all accessible portions of each vessel. Bilateral testing is considered an integral part of a complete examination. Limited examinations for reoccurring indications may be performed as  noted. +-----------------+-------------+----------+------------------------+ Right Cephalic   Diameter (cm)Depth (cm)        Findings         +-----------------+-------------+----------+------------------------+ Mid upper arm        0.32        0.56                            +-----------------+-------------+----------+------------------------+ Dist upper arm       0.35        1.14          branching         +-----------------+-------------+----------+------------------------+ Antecubital fossa    0.26        0.79   thrombosed and branching +-----------------+-------------+----------+------------------------+ Prox forearm         0.28        0.89                            +-----------------+-------------+----------+------------------------+ Mid forearm          0.28        0.69          branching         +-----------------+-------------+----------+------------------------+ Wrist                0.30        0.54                            +-----------------+-------------+----------+------------------------+ +-----------------+-------------+----------+---------+ Right Basilic    Diameter (cm)Depth (cm)Findings  +-----------------+-------------+----------+---------+ Antecubital fossa    0.32        0.52             +-----------------+-------------+----------+---------+ Prox forearm         0.36        1.11   branching +-----------------+-------------+----------+---------+ Mid forearm          0.24        0.71   branching +-----------------+-------------+----------+---------+ Wrist                0.19        0.41             +-----------------+-------------+----------+---------+ +-----------------+-------------+----------+---------+ Left Cephalic    Diameter (cm)Depth (cm)Findings  +-----------------+-------------+----------+---------+ Mid upper arm        0.19        0.31             +-----------------+-------------+----------+---------+ Dist upper  arm       0.15        0.22             +-----------------+-------------+----------+---------+ Antecubital fossa    0.17        0.16             +-----------------+-------------+----------+---------+ Prox forearm         0.30        0.65   branching +-----------------+-------------+----------+---------+ Mid forearm          0.19        0.86   branching +-----------------+-------------+----------+---------+ Wrist                0.22        0.69             +-----------------+-------------+----------+---------+ +-----------------+-------------+----------+---------+ Left Basilic     Diameter (cm)Depth (cm)Findings  +-----------------+-------------+----------+---------+ Mid upper arm  0.75        2.87             +-----------------+-------------+----------+---------+ Dist upper arm       0.39        2.18             +-----------------+-------------+----------+---------+ Antecubital fossa    0.39        0.74             +-----------------+-------------+----------+---------+ Prox forearm         0.32        0.93             +-----------------+-------------+----------+---------+ Mid forearm          0.21        0.65   branching +-----------------+-------------+----------+---------+ Wrist                0.13        0.48             +-----------------+-------------+----------+---------+ *See table(s) above for measurements and observations.  Diagnosing physician: Ruta Hinds MD Electronically signed by Ruta Hinds MD on 08/03/2019 at 1:47:51 PM.    Final       Scheduled Meds: . allopurinol  100 mg Oral Daily  . amiodarone  200 mg Oral Daily  . apixaban  5 mg Oral BID  . atorvastatin  40 mg Oral QHS  . Chlorhexidine Gluconate Cloth  6 each Topical Daily  . darbepoetin (ARANESP) injection - NON-DIALYSIS  150 mcg Subcutaneous Q Thu-1800  . docusate sodium  100 mg Oral Daily  . feeding supplement (ENSURE ENLIVE)  237 mL Oral BID WC  . feeding  supplement (PRO-STAT SUGAR FREE 64)  30 mL Oral BID  . umeclidinium bromide  1 puff Inhalation Daily   And  . fluticasone furoate-vilanterol  1 puff Inhalation Daily  . insulin aspart  0-9 Units Subcutaneous TID WC  . insulin glargine  5 Units Subcutaneous QHS  . metoprolol tartrate  25 mg Oral BID  . multivitamin  1 tablet Oral QHS  . pantoprazole  40 mg Oral BID AC  . sertraline  50 mg Oral Daily   Continuous Infusions: . sodium chloride Stopped (07/29/19 1306)  . sodium chloride    . sodium chloride    . [START ON 08/04/2019]  ceFAZolin (ANCEF) IV       LOS: 16 days      Debbe Odea, MD [facility] Pager: [email or web address] Password [email or web address] 08/03/2019, 3:08 PM

## 2019-08-03 NOTE — Progress Notes (Signed)
Admit: 07/17/2019 LOS: 62  48F with advanced CKD, AKI and now dialysis dependent in setting of GIB,  Subjective:  . No interval events . For AVF 12/31  12/28 0701 - 12/29 0700 In: 1020 [P.O.:1020] Out: 1 [Urine:1]  Filed Weights   08/01/19 0645 08/01/19 1030 08/02/19 2105  Weight: 90.8 kg 88.8 kg 88.8 kg    Scheduled Meds: . allopurinol  100 mg Oral Daily  . amiodarone  200 mg Oral Daily  . apixaban  5 mg Oral BID  . atorvastatin  40 mg Oral QHS  . Chlorhexidine Gluconate Cloth  6 each Topical Daily  . darbepoetin (ARANESP) injection - NON-DIALYSIS  150 mcg Subcutaneous Q Thu-1800  . docusate sodium  100 mg Oral Daily  . feeding supplement (ENSURE ENLIVE)  237 mL Oral BID WC  . feeding supplement (PRO-STAT SUGAR FREE 64)  30 mL Oral BID  . umeclidinium bromide  1 puff Inhalation Daily   And  . fluticasone furoate-vilanterol  1 puff Inhalation Daily  . insulin aspart  0-9 Units Subcutaneous TID WC  . insulin glargine  5 Units Subcutaneous QHS  . metoprolol tartrate  25 mg Oral BID  . multivitamin  1 tablet Oral QHS  . pantoprazole  40 mg Oral BID AC  . sertraline  50 mg Oral Daily   Continuous Infusions: . sodium chloride Stopped (07/29/19 1306)  . [START ON 08/04/2019]  ceFAZolin (ANCEF) IV     PRN Meds:.sodium chloride, acetaminophen **OR** acetaminophen, albuterol  Current Labs: reviewed  Results for [name], [name] (MRN [id number]) as of 08/02/2019 11:10  Ref. Range 07/23/2019 03:51  Saturation Ratios Latest Ref Range: 10.4 - 31.8 % 8 (L)  Ferritin Latest Ref Range: 11 - 307 ng/mL 84    Physical Exam:  Blood pressure (!) 126/54, pulse 84, temperature 98.8 F (37.1 C), temperature source Oral, resp. rate 18, height 4\' 11"  (1.499 m), weight 88.8 kg, SpO2 95 %. Elderly, obese NAD Regular TDC C/D/I Coarse bs/ b/l nl wob No sig LEE  A 1. AoCKD, now likely ESRD 2. UGIB, EGD with esophagitis, schatzki ring, hiatal hernia 3. AFib hx/o RVR, back on  apixaban 4. OSA on CPAP 5. DM2 6. Anemia on ESA; already rec Fereheme 7. Obesity 8. COPD 9. CKD BMD: P at goal  P . Cont HD on THS Schedule: 4h, TDC, 2-3L UF, no heparin, 2K . [facility] following for AVF/G . CLIP in process as ESRD . Medication Issues; o Preferred narcotic agents for pain control are hydromorphone, fentanyl, and methadone. Morphine should not be used.  o Baclofen should be avoided o Avoid oral sodium phosphate and magnesium citrate based laxatives / bowel preps    Pearson Grippe MD 08/03/2019, 1:16 PM  Recent Labs  Lab 08/01/19 0716 08/02/19 0415 08/03/19 0500  NA 136 135 138  K 5.1 4.2 4.2  CL 103 99 104  CO2 25 28 26   GLUCOSE 91 98 94  BUN 29* 20 32*  CREATININE 4.18* 2.68* 3.29*  CALCIUM 8.3* 8.0* 7.9*  PHOS 4.1 3.2 3.9   Recent Labs  Lab 08/01/19 0716 08/02/19 0415 08/03/19 0500  WBC 9.8 11.7* 11.9*  HGB 9.0* 9.2* 8.5*  HCT 28.3* 28.8* 27.0*  MCV 90.4 90.0 91.2  PLT 205 242 222

## 2019-08-03 NOTE — Progress Notes (Signed)
Nutrition Follow up  DOCUMENTATION CODES:   Obesity unspecified  INTERVENTION:   Liberalize diet to REGULAR   Continue Magic cup TID with meals, each supplement provides 290 kcal and 9 grams of protein  Continue Ensure Enlive po BID, each supplement provides 350 kcal and 20 grams of protein  Continue 30 ml Prostat BID, each supplement provides 100 kcals and 15 grams protein.   Transition B complex with Vitamin C to renal MVI  NUTRITION DIAGNOSIS:   Increased nutrient needs related to catabolic illness, acute illness as evidenced by estimated needs.  Ongoing  GOAL:   Patient will meet greater than or equal to 90% of their needs  Progressing  MONITOR:   PO intake, Weight trends, Supplement acceptance, Labs, Skin  REASON FOR ASSESSMENT:   LOS(CRRT)    ASSESSMENT:   73 yo female admitted with hematemesis with UGI, AKI on CKD VI requiring CRRT. PMH includes CKD III, CHF, COPD, chronic respiratory failure, obesity, GERD, hiatal hernia, DM  12/12 CT abdomen with large hiatal hernia with majority of stomach in chest 12/13 Admitted 12/14 EGD with esophagitis, Schatzki ring dilation 12/20 CRRT initiated 12/27- transition iHD  RD working remotely.  Plan permanent AVF Thursday.   Pt reports appetite is progressing slowly. Meal completions charted as 25-100% for her last five meals. Ate cereal with orange juice for breakfast. Drinking Ensure and Prostat as ordered. Discussed the importance of protein intake for preservation of lean body mass. Pt needs HD diet education. Will provide handout in d/c instructions and attempt to discuss in person if possible.   Admission weight: 83.1 kg  Current weight: 88.8 kg (down 2 kg post HD)   I/O: +8,163 ml since 12/15 UOP: anuric  Last HD 12/27: no net UF documented?   Medications: b complex with Vit C, aranesp, colace, SS novolog, lantus Labs: corrected calcium 9.7 (wdl) CBG 90-154   Diet Order:  Heart Health/Carb  Modified  EDUCATION NEEDS:   Education needs have been addressed  Skin:  Skin Assessment: Skin Integrity Issues: Skin Integrity Issues:: Stage II, Other (Comment) Stage II: R thigh Other: MASD-buttocks  Last BM:  12/27  Height:   Ht Readings from Last 1 Encounters:  07/30/19 4\' 11"  (1.499 m)    Weight:   Wt Readings from Last 1 Encounters:  08/02/19 88.8 kg    Ideal Body Weight:  45.5 kg  BMI:  Body mass index is 39.54 kg/m.  Estimated Nutritional Needs:   Kcal:  1800-2000 kcals  Protein:  90-100 g  Fluid:  1000 ml + UOP   Mariana Single RD, LDN Clinical Nutrition Pager # [phone]

## 2019-08-03 NOTE — Plan of Care (Signed)
  Problem: Activity: Goal: Activity intolerance will improve Outcome: Progressing   

## 2019-08-04 LAB — RENAL FUNCTION PANEL
Albumin: 1.6 g/dL — ABNORMAL LOW (ref 3.5–5.0)
Anion gap: 6 (ref 5–15)
BUN: 14 mg/dL (ref 8–23)
CO2: 30 mmol/L (ref 22–32)
Calcium: 7.7 mg/dL — ABNORMAL LOW (ref 8.9–10.3)
Chloride: 99 mmol/L (ref 98–111)
Creatinine, Ser: 2.01 mg/dL — ABNORMAL HIGH (ref 0.44–1.00)
GFR calc Af Amer: 28 mL/min — ABNORMAL LOW (ref >60)
GFR calc non Af Amer: 24 mL/min — ABNORMAL LOW (ref >60)
Glucose, Bld: 96 mg/dL (ref 70–99)
Phosphorus: 3.1 mg/dL (ref 2.5–4.6)
Potassium: 3.6 mmol/L (ref 3.5–5.1)
Sodium: 135 mmol/L (ref 135–145)

## 2019-08-04 LAB — GLUCOSE, CAPILLARY
Glucose-Capillary: 100 mg/dL — ABNORMAL HIGH (ref 70–99)
Glucose-Capillary: 84 mg/dL (ref 70–99)
Glucose-Capillary: 91 mg/dL (ref 70–99)
Glucose-Capillary: 129 mg/dL — ABNORMAL HIGH (ref 70–99)

## 2019-08-04 LAB — MAGNESIUM: Magnesium: 1.9 mg/dL (ref 1.7–2.4)

## 2019-08-04 NOTE — Progress Notes (Signed)
Admit: 07/17/2019 LOS: 38  61F with advanced CKD, AKI and now dialysis dependent in setting of GIB,  Subjective:  . No interval events . For AVF 12/31 . HD yesterday 2L UF  12/29 0701 - 12/30 0700 In: 220 [P.O.:220] Out: 2000   Filed Weights   08/02/19 2105 08/03/19 1220 08/03/19 1624  Weight: 88.8 kg 87.1 kg 85.8 kg    Scheduled Meds: . allopurinol  100 mg Oral Daily  . amiodarone  200 mg Oral Daily  . atorvastatin  40 mg Oral QHS  . Chlorhexidine Gluconate Cloth  6 each Topical Daily  . darbepoetin (ARANESP) injection - NON-DIALYSIS  150 mcg Subcutaneous Q Thu-1800  . docusate sodium  100 mg Oral Daily  . feeding supplement (ENSURE ENLIVE)  237 mL Oral BID WC  . feeding supplement (PRO-STAT SUGAR FREE 64)  30 mL Oral BID  . umeclidinium bromide  1 puff Inhalation Daily   And  . fluticasone furoate-vilanterol  1 puff Inhalation Daily  . insulin aspart  0-9 Units Subcutaneous TID WC  . insulin glargine  5 Units Subcutaneous QHS  . mouth rinse  15 mL Mouth Rinse BID  . metoprolol tartrate  25 mg Oral BID  . multivitamin  1 tablet Oral QHS  . pantoprazole  40 mg Oral BID AC  . sertraline  50 mg Oral Daily   Continuous Infusions: . sodium chloride Stopped (07/29/19 1306)  .  ceFAZolin (ANCEF) IV     PRN Meds:.sodium chloride, acetaminophen **OR** acetaminophen, albuterol, sodium chloride flush  Current Labs: reviewed  Results for SAVAYA, HAKES (MRN [id number]) as of 08/02/2019 11:10  Ref. Range 07/23/2019 03:51  Saturation Ratios Latest Ref Range: 10.4 - 31.8 % 8 (L)  Ferritin Latest Ref Range: 11 - 307 ng/mL 84    Physical Exam:  Blood pressure 97/67, pulse 82, temperature 98.1 F (36.7 C), temperature source Oral, resp. rate 19, height 4\' 11"  (1.499 m), weight 85.8 kg, SpO2 94 %. Elderly, obese NAD Regular TDC C/D/I Coarse bs/ b/l nl wob No sig LEE  A 1. AoCKD, now likely ESRD 2. UGIB, EGD with esophagitis, schatzki ring, hiatal hernia 3. AFib hx/o  RVR, back on apixaban 4. OSA on CPAP 5. DM2 6. Anemia on ESA; already rec Fereheme 7. Obesity 8. COPD 9. CKD BMD: P at goal  P . Cont HD on THS Schedule: 4h, TDC, 2-3L UF, no heparin, 2K . VVS following for AVF/G on 12/31 . CLIP in process as ESRD, housing issues are slowing things down o    Pearson Grippe MD 08/04/2019, 1:33 PM  Recent Labs  Lab 08/02/19 0415 08/03/19 0500 08/04/19 0440  NA 135 138 135  K 4.2 4.2 3.6  CL 99 104 99  CO2 28 26 30   GLUCOSE 98 94 96  BUN 20 32* 14  CREATININE 2.68* 3.29* 2.01*  CALCIUM 8.0* 7.9* 7.7*  PHOS 3.2 3.9 3.1   Recent Labs  Lab 08/01/19 0716 08/02/19 0415 08/03/19 0500  WBC 9.8 11.7* 11.9*  HGB 9.0* 9.2* 8.5*  HCT 28.3* 28.8* 27.0*  MCV 90.4 90.0 91.2  PLT 205 242 222

## 2019-08-04 NOTE — Progress Notes (Signed)
Pt. States she operates her own cpap unit. No issues at this time.

## 2019-08-04 NOTE — Progress Notes (Signed)
Hospitalist daily note   [name] [id number] DOB: [birth year] DOA: 07/17/2019  PCP: [name], MD   Narrative:  27 white female CKD 3P COPD chronic respiratory failure OSA/CPAP (on trilogy) DM TY 2 A. fib >Chad score 4 on anticoagulation HTN, some urinary retention, IBS Initially admitted by [facility] after having an episode of coffee-ground emesis nausea vomiting had active bleeding ulcers she was transferred for consideration of CRRT   Data Reviewed:  EGD 12/14 esophagitis Schatzki's ring dilatation Developed A. fib with RVR 12/15 EGD 2/14 07/25/19> Rt femoral triple lumen catheter  07/29/19> L IR tunneled HD cath  2 D ECHO 1. Left ventricular ejection fraction, by visual estimation, is 55 to 60%. The left ventricle has normal function. There is mildly increased left ventricular hypertrophy. 2. Left ventricular diastolic function could not be evaluated. 3. The left ventricle has no regional wall motion abnormalities. 4. Global right ventricle has normal systolic function.The right ventricular size is normal. No increase in right ventricular wall thickness. 5. Left atrial size was moderately dilated. 6. Right atrial size was mildly dilated. 7. Mild mitral annular calcification. 8. The mitral valve is degenerative. Trivial mitral valve regurgitation. 9. The tricuspid valve is grossly normal. Tricuspid valve regurgitation moderate. 10. The tricuspid valve was normal in structure. Tricuspid valve regurgitation moderate. 11. The aortic valve is grossly normal. Aortic valve regurgitation is not visualized. 12. Moderately elevated pulmonary artery systolic pressure. 13. The tricuspid regurgitant velocity is 3.12 m/s, and with an assumed right atrial pressure of 15 mmHg, the estimated right ventricular systolic pressure is moderately elevated at 54.0 mmHg. 14. The inferior vena cava is dilated in size with <50% respiratory variability, suggesting right atrial pressure of 15  mmHg. Assessment & Plan: coffee-ground emesis-resolved-now on regular diet-continue Protonix 40 twice daily monitor trends prior to admission was on aspirin 81 which is been held  A. fib RVR during hospital stay-Eliquis on hold 2/2 need for Vascular access placement on 12/31 SCDs at this time, Cardizem not continued on admission continue amiodarone 200 daily, metoprolol  75 twice daily [home]  Currently 25 bid  Chronic respiratory failure and COPD-need desat screen OSA on CPAP-continue CPAP/trilogy when discharge-stable Has used oxygen at home for several years  DM TY 2-CBGs ranging from 100-117-continue sliding scale coverage with 5 units of Lantus  HFpEF-Home meds as above, holding torsemide 50 from prior to admission  Class III obesity-outpatient weight restrictive surgery possible  Urinary incontinence-continue Flomax 0.4 at bedtime  UTI 2/2 Enterobacter--compelted Rx  Gout-continue allopurinol  scd until procedure, Full code, Inpatient, no family Likely will need SNF--consulte dTOC team  Subjective: Awake alert coherent in nad no focal deficit No dark stool no tarry stool eating and drinking--tolerating 4 hrs of dialysis  Consultants:   Cardiology, nephrology, GI Procedures:   As above Antimicrobials:   None   Objective: Vitals:   08/03/19 2106 08/04/19 0453 08/04/19 0739 08/04/19 0853  BP: (!) 131/45 (!) 104/37  97/67  Pulse: 90 77  82  Resp: 16 19    Temp: 98.9 F (37.2 C) 98.2 F (36.8 C)  98.1 F (36.7 C)  TempSrc: Oral Oral  Oral  SpO2: 90% 92% 92% 94%  Weight:      Height:        Intake/Output Summary (Last 24 hours) at 08/04/2019 1014 Last data filed at 08/04/2019 0453 Gross per 24 hour  Intake 100 ml  Output 2000 ml  Net -1900 ml   [employer]  08/02/19 2105 08/03/19 1220 08/03/19 1624  Weight: 88.8 kg 87.1 kg 85.8 kg    Examination: Awake pleasant in na dno focal deficit s1 s 2no m cta b no added sound Abd obese nt nd no  rebound no guard No LE edeam Mallampati 3  Scheduled Meds: . allopurinol  100 mg Oral Daily  . amiodarone  200 mg Oral Daily  . atorvastatin  40 mg Oral QHS  . Chlorhexidine Gluconate Cloth  6 each Topical Daily  . darbepoetin (ARANESP) injection - NON-DIALYSIS  150 mcg Subcutaneous Q Thu-1800  . docusate sodium  100 mg Oral Daily  . feeding supplement (ENSURE ENLIVE)  237 mL Oral BID WC  . feeding supplement (PRO-STAT SUGAR FREE 64)  30 mL Oral BID  . umeclidinium bromide  1 puff Inhalation Daily   And  . fluticasone furoate-vilanterol  1 puff Inhalation Daily  . insulin aspart  0-9 Units Subcutaneous TID WC  . insulin glargine  5 Units Subcutaneous QHS  . mouth rinse  15 mL Mouth Rinse BID  . metoprolol tartrate  25 mg Oral BID  . multivitamin  1 tablet Oral QHS  . pantoprazole  40 mg Oral BID AC  . sertraline  50 mg Oral Daily   Continuous Infusions: . sodium chloride Stopped (07/29/19 1306)  .  ceFAZolin (ANCEF) IV       LOS: 17 days   Time spent: [place], MD [facility]

## 2019-08-04 NOTE — Progress Notes (Signed)
[facility]  Subjective  - no complaints   Objective (!) 104/37 77 98.2 F (36.8 C) (Oral) 19 92%  Intake/Output Summary (Last 24 hours) at 08/04/2019 0739 Last data filed at 08/04/2019 0453 Gross per 24 hour  Intake 220 ml  Output 2000 ml  Net -1780 ml   Left radial and brachial pulse palpable Left IJ tunneled catheter  Laboratory Lab Results: Recent Labs    08/02/19 0415 08/03/19 0500  WBC 11.7* 11.9*  HGB 9.2* 8.5*  HCT 28.8* 27.0*  PLT 242 222   BMET Recent Labs    08/03/19 0500 08/04/19 0440  NA 138 135  K 4.2 3.6  CL 104 99  CO2 26 30  GLUCOSE 94 96  BUN 32* 14  CREATININE 3.29* 2.01*  CALCIUM 7.9* 7.7*    COAG Lab Results  Component Value Date   INR 1.0 10/25/2008   No results found for: PTT  Assessment/Planning:  13 old female that vascular surgery was consulted for permanent AV fistula access.  After review of vein mapping appears to have a basilic vein in the left arm given she is right-handed.  We will schedule for tomorrow with Dr. Donzetta Matters.  Please continue to hold her Eliquis.  NPO after midnight.  Marty Heck 08/04/2019 7:39 AM --

## 2019-08-04 NOTE — Plan of Care (Signed)
  Problem: Activity: Goal: Risk for activity intolerance will decrease Outcome: Progressing   

## 2019-08-04 NOTE — Plan of Care (Signed)
  Problem: Education: Goal: Knowledge of General Education information will improve Description Including pain rating scale, medication(s)/side effects and non-pharmacologic comfort measures Outcome: Progressing   

## 2019-08-04 NOTE — Progress Notes (Signed)
Physical Therapy Treatment Patient Details Name: [name] MRN: [id number] DOB: [birth year] Today's Date: 08/04/2019    History of Present Illness 73 yo F with a history of diastolic heart failure, CKD stage IIIb, COPD, chronic respiratory failure, obesity, OSA, Afib (on chronic anticoagulation), GERD, hiatal hernia, gout, DM2, and chronic anemia who presented to [facility] on 12/12 after an episode of coffee ground emesis, nausea and vomiting. She was also started on ciprofloaxinc for a UTI. developed afib with RVR, started on amiodarone, and has had progressive renal failure, and has been anuric the last 24 hours. Pt off CRRT on 12/24.    PT Comments    Pt was seen for progression of gait and ex's, but was too tired from time in chair to add much distance to her previous PT visit accomplishment.  Pt is able to do ex's with vc's, and this control makes her a good candidate for SNF care along with her limited tolerance for gait.  See acutely for these needs and ask pt to work in standing more next visit.   Follow Up Recommendations  SNF     Equipment Recommendations  None recommended by PT    Recommendations for Other Services       Precautions / Restrictions Precautions Precautions: Fall Precaution Comments: pt is smaller stature, monitor at side of bed to scoot back enough Restrictions Weight Bearing Restrictions: No    Mobility  Bed Mobility Overal bed mobility: Needs Assistance Bed Mobility: Sit to Supine       Sit to supine: Min guard   General bed mobility comments: extra time and did assist with verbal cues to reposition on the bed  Transfers Overall transfer level: Needs assistance Equipment used: Rolling walker (2 wheeled);1 person hand held assist Transfers: Sit to/from Stand Sit to Stand: Min guard;Min assist         General transfer comment: min guard to get up but min assist to scoot back on bed  Ambulation/Gait Ambulation/Gait assistance: Min  guard Gait Distance (Feet): 5 Feet Assistive device: Rolling walker (2 wheeled) Gait Pattern/deviations: Step-to pattern;Decreased stride length;Wide base of support Gait velocity: decreased       Stairs             Wheelchair Mobility    Modified Rankin (Stroke Patients Only)       Balance Overall balance assessment: Needs assistance Sitting-balance support: Bilateral upper extremity supported;Feet supported Sitting balance-Leahy Scale: Good Sitting balance - Comments: supervision   Standing balance support: Bilateral upper extremity supported Standing balance-Leahy Scale: Fair Standing balance comment: less than fair dynamically                            Cognition Arousal/Alertness: Awake/alert Behavior During Therapy: WFL for tasks assessed/performed Overall Cognitive Status: Within Functional Limits for tasks assessed                                        Exercises General Exercises - Lower Extremity Ankle Circles/Pumps: AROM;5 reps Quad Sets: AROM;10 reps Gluteal Sets: AROM;10 reps Heel Slides: AROM;10 reps Hip ABduction/ADduction: AROM;10 reps Straight Leg Raises: AROM;10 reps Hip Flexion/Marching: AROM;10 reps    General Comments General comments (skin integrity, edema, etc.): Pt is challenged mainly by her height, and upon arrival to room noted pt had scooted to bottom of recliner.  Assisted her  to sit upright and then she could safely control STS      Pertinent Vitals/Pain Pain Assessment: No/denies pain    Home Living                      Prior Function            PT Goals (current goals can now be found in the care plan section) Acute Rehab PT Goals Patient Stated Goal: get strength back Progress towards PT goals: Progressing toward goals    Frequency    Min 3X/week      PT Plan Current plan remains appropriate    Co-evaluation              AM-PAC PT "6 Clicks" Mobility   Outcome  Measure  Help needed turning from your back to your side while in a flat bed without using bedrails?: None Help needed moving from lying on your back to sitting on the side of a flat bed without using bedrails?: None Help needed moving to and from a bed to a chair (including a wheelchair)?: A Little Help needed standing up from a chair using your arms (e.g., wheelchair or bedside chair)?: A Little Help needed to walk in hospital room?: A Little Help needed climbing 3-5 steps with a railing? : A Lot 6 Click Score: 19    End of Session Equipment Utilized During Treatment: Gait belt Activity Tolerance: Patient tolerated treatment well Patient left: in bed;with call bell/phone within reach;with bed alarm set Nurse Communication: Mobility status PT Visit Diagnosis: Muscle weakness (generalized) (M62.81)     Time: 3646-8032 PT Time Calculation (min) (ACUTE ONLY): 33 min  Charges:  $Gait Training: 8-22 mins $Therapeutic Exercise: 8-22 mins                    Ramond Dial 08/04/2019, 4:43 PM   Mee Hives, PT MS Acute Rehab Dept. Number: [place] [facility] [place]

## 2019-08-04 NOTE — Progress Notes (Signed)
Pt stated she can place self on home bipap unit when ready for bed. RT will continue to monitor as needed.

## 2019-08-04 NOTE — Progress Notes (Signed)
Renal Navigator met with patient at bedside to discuss OP HD referral requested by Dr. Cristal Generous. Patient aware of OP HD need at discharge and agreeable to meeting with Navigator. Patient's address discussed, as [facility] is listed as her address in [place]. She states she has been there before, but does not live there now. She states that her son is in the process of moving her to his new home in [place], but that she does not know the address. Navigator requested to call son Amaryllis Dyke) and patient agreed. Patient's son/David called at this time and patient put Navigator on the phone with him. 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 He reports that he and his wife are closing on a home that will allow them to accommodate his mother (patient) living with them. He states the home will be ready to move in mid to late January, approximately. He asked if his mother will be going to a rehab facility from discharge and if she is not, she will need to stay with his brother [name]) in [place] until his home in [place] is ready to move in to. Navigator asked if he, and made sure patient was also listening, are open to a recommendation for SNF, if it is made. [name] states that is completely up to his mother. Once off the phone, patient states she is open to SNF recommendation (though she states no one has made this recommendation to her at this time), but she is NOT interested in going back to [facility]. She asked Navigator about [employer] stated no knowledge of any of the SNFs. Navigator informed patient that a CSW will meet with her about rehab if this is the recommendation. Renal Navigator needs to know if patient will go to SNF (preferably in [place]) or to son's home in  [place] at discharge so that OP HD can be arranged. Renal Navigator discussed this with CSW/V. Crawford and is holding off on OP HD referral until disposition has been determined. If she goes to a SNF in [place], she can be set up at a clinic in [place] and not have to be transferred, most likely. Renal Navigator following.  [name], [name] Renal Navigator [phone]

## 2019-08-05 ENCOUNTER — Encounter (HOSPITAL_COMMUNITY): Payer: Self-pay | Admitting: Pulmonary Disease

## 2019-08-05 ENCOUNTER — Encounter (HOSPITAL_COMMUNITY)
Admission: EM | Disposition: A | Discharge status: Skilled Nursing Facility | Payer: Self-pay | Source: Skilled Nursing Facility | Attending: Family Medicine

## 2019-08-05 ENCOUNTER — Inpatient Hospital Stay (HOSPITAL_COMMUNITY): Payer: Medicare Other | Admitting: Anesthesiology

## 2019-08-05 HISTORY — PX: AV FISTULA PLACEMENT: SHX1204

## 2019-08-05 LAB — RENAL FUNCTION PANEL
Albumin: 1.7 g/dL — ABNORMAL LOW (ref 3.5–5.0)
Anion gap: 9 (ref 5–15)
BUN: 30 mg/dL — ABNORMAL HIGH (ref 8–23)
CO2: 29 mmol/L (ref 22–32)
Calcium: 7.9 mg/dL — ABNORMAL LOW (ref 8.9–10.3)
Chloride: 99 mmol/L (ref 98–111)
Creatinine, Ser: 3.49 mg/dL — ABNORMAL HIGH (ref 0.44–1.00)
GFR calc Af Amer: 14 mL/min — ABNORMAL LOW (ref >60)
GFR calc non Af Amer: 12 mL/min — ABNORMAL LOW (ref >60)
Glucose, Bld: 97 mg/dL (ref 70–99)
Phosphorus: 4.1 mg/dL (ref 2.5–4.6)
Potassium: 4.1 mmol/L (ref 3.5–5.1)
Sodium: 137 mmol/L (ref 135–145)

## 2019-08-05 LAB — CBC
HCT: 26.7 % — ABNORMAL LOW (ref 36.0–46.0)
Hemoglobin: 8.4 g/dL — ABNORMAL LOW (ref 12.0–15.0)
MCH: 29.2 pg (ref 26.0–34.0)
MCHC: 31.5 g/dL (ref 30.0–36.0)
MCV: 92.7 fL (ref 80.0–100.0)
Platelets: 260 10*3/uL (ref 150–400)
RBC: 2.88 MIL/uL — ABNORMAL LOW (ref 3.87–5.11)
RDW: 19.9 % — ABNORMAL HIGH (ref 11.5–15.5)
WBC: 6.2 10*3/uL (ref 4.0–10.5)
nRBC: 0 % (ref 0.0–0.2)

## 2019-08-05 LAB — GLUCOSE, CAPILLARY
Glucose-Capillary: 86 mg/dL (ref 70–99)
Glucose-Capillary: 102 mg/dL — ABNORMAL HIGH (ref 70–99)
Glucose-Capillary: 95 mg/dL (ref 70–99)
Glucose-Capillary: 110 mg/dL — ABNORMAL HIGH (ref 70–99)

## 2019-08-05 LAB — MAGNESIUM: Magnesium: 1.9 mg/dL (ref 1.7–2.4)

## 2019-08-05 LAB — PROTIME-INR
INR: 1.4 — ABNORMAL HIGH (ref 0.8–1.2)
Prothrombin Time: 17.2 seconds — ABNORMAL HIGH (ref 11.4–15.2)

## 2019-08-05 LAB — SURGICAL PCR SCREEN
MRSA, PCR: NEGATIVE
Staphylococcus aureus: NEGATIVE

## 2019-08-05 SURGERY — ARTERIOVENOUS (AV) FISTULA CREATION
Anesthesia: General | Site: Arm Upper | Laterality: Left

## 2019-08-05 MED ORDER — FENTANYL CITRATE (PF) 250 MCG/5ML IJ SOLN
INTRAMUSCULAR | Status: AC
  Filled 2019-08-05: qty 5

## 2019-08-05 MED ORDER — SODIUM CHLORIDE 0.9 % IV SOLN: INTRAVENOUS | Status: DC

## 2019-08-05 MED ORDER — 0.9 % SODIUM CHLORIDE (POUR BTL) OPTIME
TOPICAL | Status: DC | PRN
  Administered 2019-08-05: 1000 mL

## 2019-08-05 MED ORDER — PROPOFOL 10 MG/ML IV BOLUS
INTRAVENOUS | Status: DC | PRN
  Administered 2019-08-05 (×2): 10 mg via INTRAVENOUS
  Administered 2019-08-05: 60 mg via INTRAVENOUS

## 2019-08-05 MED ORDER — DARBEPOETIN ALFA 150 MCG/0.3ML IJ SOSY
PREFILLED_SYRINGE | INTRAMUSCULAR | Status: AC
  Filled 2019-08-05: qty 0.3

## 2019-08-05 MED ORDER — PROMETHAZINE HCL 25 MG/ML IJ SOLN: 6.2500 mg | INTRAMUSCULAR | Status: DC | PRN

## 2019-08-05 MED ORDER — PHENYLEPHRINE HCL-NACL 10-0.9 MG/250ML-% IV SOLN
INTRAVENOUS | Status: DC | PRN
  Administered 2019-08-05: 25 ug/min via INTRAVENOUS

## 2019-08-05 MED ORDER — APIXABAN 5 MG PO TABS
5.0000 mg | ORAL_TABLET | Freq: Two times a day (BID) | ORAL | Status: DC
  Administered 2019-08-06 – 2019-08-11 (×10): 5 mg via ORAL
  Filled 2019-08-05 (×11): qty 1

## 2019-08-05 MED ORDER — CEFAZOLIN SODIUM-DEXTROSE 2-4 GM/100ML-% IV SOLN
2.0000 g | Freq: Once | INTRAVENOUS | Status: AC
  Administered 2019-08-05: 2 g via INTRAVENOUS

## 2019-08-05 MED ORDER — FENTANYL CITRATE (PF) 100 MCG/2ML IJ SOLN: 25.0000 ug | INTRAMUSCULAR | Status: DC | PRN

## 2019-08-05 MED ORDER — CEFAZOLIN SODIUM-DEXTROSE 2-4 GM/100ML-% IV SOLN
INTRAVENOUS | Status: AC
  Filled 2019-08-05: qty 100

## 2019-08-05 MED ORDER — LIDOCAINE-EPINEPHRINE (PF) 1 %-1:200000 IJ SOLN
INTRAMUSCULAR | Status: DC | PRN
  Administered 2019-08-05: 22 mL

## 2019-08-05 MED ORDER — FENTANYL CITRATE (PF) 100 MCG/2ML IJ SOLN
INTRAMUSCULAR | Status: DC | PRN
  Administered 2019-08-05 (×2): 25 ug via INTRAVENOUS

## 2019-08-05 MED ORDER — PAPAVERINE HCL 30 MG/ML IJ SOLN
INTRAMUSCULAR | Status: AC
  Filled 2019-08-05: qty 2

## 2019-08-05 MED ORDER — ONDANSETRON HCL 4 MG/2ML IJ SOLN
INTRAMUSCULAR | Status: DC | PRN
  Administered 2019-08-05: 4 mg via INTRAVENOUS

## 2019-08-05 MED ORDER — SODIUM CHLORIDE 0.9 % IV SOLN: INTRAVENOUS | Status: DC | PRN

## 2019-08-05 MED ORDER — SODIUM CHLORIDE 0.9 % IV SOLN
INTRAVENOUS | Status: DC | PRN
  Administered 2019-08-05: 500 mL

## 2019-08-05 MED ORDER — HEPARIN SODIUM (PORCINE) 1000 UNIT/ML IJ SOLN
INTRAMUSCULAR | Status: AC
  Administered 2019-08-05: 3800 [IU] via INTRAVENOUS
  Filled 2019-08-05: qty 4

## 2019-08-05 MED ORDER — LIDOCAINE HCL (CARDIAC) PF 100 MG/5ML IV SOSY
PREFILLED_SYRINGE | INTRAVENOUS | Status: DC | PRN
  Administered 2019-08-05: 20 mg via INTRATRACHEAL

## 2019-08-05 MED ORDER — SODIUM CHLORIDE 0.9 % IV SOLN
INTRAVENOUS | Status: AC
  Filled 2019-08-05: qty 1.2

## 2019-08-05 MED ORDER — PROPOFOL 500 MG/50ML IV EMUL
INTRAVENOUS | Status: DC | PRN
  Administered 2019-08-05: 50 ug/kg/min via INTRAVENOUS

## 2019-08-05 MED ORDER — TRAMADOL HCL 50 MG PO TABS: 50.0000 mg | ORAL_TABLET | Freq: Four times a day (QID) | ORAL | Status: DC | PRN

## 2019-08-05 MED ORDER — HEPARIN SODIUM (PORCINE) 1000 UNIT/ML IJ SOLN: 1000.0000 [IU] | INTRAMUSCULAR | Status: DC | PRN

## 2019-08-05 MED ORDER — PROPOFOL 10 MG/ML IV BOLUS
INTRAVENOUS | Status: AC
  Filled 2019-08-05: qty 20

## 2019-08-05 MED ORDER — LIDOCAINE-EPINEPHRINE 1 %-1:100000 IJ SOLN
INTRAMUSCULAR | Status: AC
  Filled 2019-08-05: qty 1

## 2019-08-05 SURGICAL SUPPLY — 29 items
ADH SKN CLS APL DERMABOND .7 (GAUZE/BANDAGES/DRESSINGS) ×1
ARMBAND PINK RESTRICT EXTREMIT (MISCELLANEOUS) ×2 IMPLANT
CANISTER SUCT 3000ML PPV (MISCELLANEOUS) ×2 IMPLANT
CLIP VESOCCLUDE MED 6/CT (CLIP) ×2 IMPLANT
CLIP VESOCCLUDE SM WIDE 6/CT (CLIP) ×2 IMPLANT
COVER PROBE W GEL 5X96 (DRAPES) IMPLANT
COVER WAND RF STERILE (DRAPES) ×1 IMPLANT
DERMABOND ADVANCED (GAUZE/BANDAGES/DRESSINGS) ×1
DERMABOND ADVANCED .7 DNX12 (GAUZE/BANDAGES/DRESSINGS) ×1 IMPLANT
ELECT REM PT RETURN 9FT ADLT (ELECTROSURGICAL) ×2
ELECTRODE REM PT RTRN 9FT ADLT (ELECTROSURGICAL) ×1 IMPLANT
GLOVE BIO SURGEON STRL SZ7.5 (GLOVE) ×2 IMPLANT
GOWN STRL REUS W/ TWL LRG LVL3 (GOWN DISPOSABLE) ×2 IMPLANT
GOWN STRL REUS W/ TWL XL LVL3 (GOWN DISPOSABLE) ×1 IMPLANT
GOWN STRL REUS W/TWL LRG LVL3 (GOWN DISPOSABLE) ×4
GOWN STRL REUS W/TWL XL LVL3 (GOWN DISPOSABLE) ×2
INSERT FOGARTY SM (MISCELLANEOUS) IMPLANT
KIT BASIN OR (CUSTOM PROCEDURE TRAY) ×2 IMPLANT
KIT TURNOVER KIT B (KITS) ×2 IMPLANT
NS IRRIG 1000ML POUR BTL (IV SOLUTION) ×2 IMPLANT
PACK CV ACCESS (CUSTOM PROCEDURE TRAY) ×2 IMPLANT
PAD ARMBOARD 7.5X6 YLW CONV (MISCELLANEOUS) ×4 IMPLANT
SUT MNCRL AB 4-0 PS2 18 (SUTURE) ×2 IMPLANT
SUT PROLENE 6 0 BV (SUTURE) ×2 IMPLANT
SUT VIC AB 3-0 SH 27 (SUTURE) ×2
SUT VIC AB 3-0 SH 27X BRD (SUTURE) ×1 IMPLANT
TOWEL GREEN STERILE (TOWEL DISPOSABLE) ×2 IMPLANT
UNDERPAD 30X30 (UNDERPADS AND DIAPERS) ×2 IMPLANT
WATER STERILE IRR 1000ML POUR (IV SOLUTION) ×2 IMPLANT

## 2019-08-05 NOTE — Progress Notes (Signed)
Renal Navigator continues to follow for disposition plan in order to know where to refer patient for OP HD.  Navigator spoke with CSW/V. Crawford to request update and per CSW, patient is being given her bed offers now-one in [place] and one in [place]. Navigator will complete referral once patient has been accepted to SNF.  Alphonzo Cruise, [place] Renal Navigator [phone]

## 2019-08-05 NOTE — Anesthesia Preprocedure Evaluation (Addendum)
Anesthesia Evaluation  Patient identified by MRN, date of birth, ID band Patient awake    Reviewed: Allergy & Precautions, NPO status , Patient's Chart, lab work & pertinent test results  Airway Mallampati: III  TM Distance: <3 FB Neck ROM: Full    Dental  (+) Dental Advisory Given, Edentulous Upper, Edentulous Lower   Pulmonary sleep apnea , COPD, former smoker,    Pulmonary exam normal breath sounds clear to auscultation       Cardiovascular hypertension, + CAD  Normal cardiovascular exam Rhythm:Regular Rate:Normal  Echo 07/27/2019  1. Left ventricular ejection fraction, by visual estimation, is 55 to 60%. The left ventricle has normal function. There is mildly increased left ventricular hypertrophy.  2. Left ventricular diastolic function could not be evaluated.  3. The left ventricle has no regional wall motion abnormalities.  4. Global right ventricle has normal systolic function.The right ventricular size is normal. No increase in right ventricular wall thickness.  5. Left atrial size was moderately dilated.  6. Right atrial size was mildly dilated.  7. Mild mitral annular calcification.  8. The mitral valve is degenerative. Trivial mitral valve regurgitation.  9. The tricuspid valve is grossly normal. Tricuspid valve regurgitation moderate. 10. The tricuspid valve was normal in structure. Tricuspid valve regurgitation moderate. 11. The aortic valve is grossly normal. Aortic valve regurgitation is not visualized. 12. Moderately elevated pulmonary artery systolic pressure. 13. The tricuspid regurgitant velocity is 3.12 m/s, and with an assumed right atrial pressure of 15 mmHg, the estimated right ventricular systolic pressure is moderately elevated at 54.0 mmHg. 14. The inferior vena cava is dilated in size with <50% respiratory variability, suggesting right atrial pressure of 15 mmHg.    Neuro/Psych negative neurological  ROS  negative psych ROS   GI/Hepatic Neg liver ROS, hiatal hernia, GERD  ,  Endo/Other  diabetesMorbid obesity  Renal/GU ESRFRenal disease  negative genitourinary   Musculoskeletal negative musculoskeletal ROS (+)   Abdominal (+) + obese,   Peds negative pediatric ROS (+)  Hematology  (+) anemia ,   Anesthesia Other Findings   Reproductive/Obstetrics negative OB ROS                            Anesthesia Physical Anesthesia Plan  ASA: III  Anesthesia Plan: MAC   Post-op Pain Management:    Induction: Intravenous  PONV Risk Score and Plan: 2 and Ondansetron, Treatment may vary due to age or medical condition and Propofol infusion  Airway Management Planned: Natural Airway  Additional Equipment: None  Intra-op Plan:   Post-operative Plan:   Informed Consent: I have reviewed the patients History and Physical, chart, labs and discussed the procedure including the risks, benefits and alternatives for the proposed anesthesia with the patient or authorized representative who has indicated his/her understanding and acceptance.     Dental advisory given  Plan Discussed with: CRNA  Anesthesia Plan Comments:        Anesthesia Quick Evaluation

## 2019-08-05 NOTE — Anesthesia Procedure Notes (Signed)
Procedure Name: MAC Date/Time: 08/05/2019 9:31 AM Performed by: Neldon Newport, CRNA Pre-anesthesia Checklist: Timeout performed, Patient being monitored, Suction available, Emergency Drugs available and Patient identified Patient Re-evaluated:Patient Re-evaluated prior to induction Oxygen Delivery Method: Simple face mask Placement Confirmation: positive ETCO2 Dental Injury: Teeth and Oropharynx as per pre-operative assessment

## 2019-08-05 NOTE — Progress Notes (Signed)
  Progress Note    08/05/2019 9:14 AM Day of Surgery  Subjective: No overnight issues  Vitals:   08/04/19 2050 08/05/19 0515  BP: (!) 143/52 132/66  Pulse: 84 78  Resp: 18 16  Temp: 98.1 F (36.7 C) 97.7 F (36.5 C)  SpO2: 93% 95%    Physical Exam: Awake alert oriented Nonlabored respirations Left arm with palpable brachial radial pulses  CBC    Component Value Date/Time   WBC 6.2 08/05/2019 0502   RBC 2.88 (L) 08/05/2019 0502   HGB 8.4 (L) 08/05/2019 0502   HCT 26.7 (L) 08/05/2019 0502   PLT 260 08/05/2019 0502   MCV 92.7 08/05/2019 0502   MCH 29.2 08/05/2019 0502   MCHC 31.5 08/05/2019 0502   RDW 19.9 (H) 08/05/2019 0502   LYMPHSABS 0.7 07/17/2019 0940   MONOABS 0.6 07/17/2019 0940   EOSABS 0.1 07/17/2019 0940   BASOSABS 0.0 07/17/2019 0940    BMET    Component Value Date/Time   NA 137 08/05/2019 0502   K 4.1 08/05/2019 0502   CL 99 08/05/2019 0502   CO2 29 08/05/2019 0502   GLUCOSE 97 08/05/2019 0502   BUN 30 (H) 08/05/2019 0502   CREATININE 3.49 (H) 08/05/2019 0502   CREATININE 1.68 (H) 10/10/2017 1050   CALCIUM 7.9 (L) 08/05/2019 0502   GFRNONAA 12 (L) 08/05/2019 0502   GFRNONAA 30 (L) 10/10/2017 1050   GFRAA 14 (L) 08/05/2019 0502   GFRAA 35 (L) 10/10/2017 1050    INR    Component Value Date/Time   INR 1.4 (H) 08/05/2019 0502     Intake/Output Summary (Last 24 hours) at 08/05/2019 0914 Last data filed at 08/05/2019 0601 Gross per 24 hour  Intake 240 ml  Output 0 ml  Net 240 ml     Assessment/plan:  73 y.o. female is here with end-stage renal disease currently on dialysis via catheter.  Plan is for left arm AV access today.  I reviewed previous vein mapping demonstrates possible suitable basilic vein on the left which I discussed with her would take 2 operations.  Otherwise we will place graft to the left upper extremity.  We discussed the risk benefits alternatives.  I will discuss with patient's son via telephone after the  operation.   Christiona Siddique C. Donzetta Matters, MD [facility] Office: [phone] Pager: [phone]  08/05/2019 9:14 AM

## 2019-08-05 NOTE — Op Note (Signed)
    Patient name: [name] MRN: [id number] DOB: Apr 18, 1946 Sex: female  08/05/2019 Pre-operative Diagnosis: esrd Post-operative diagnosis:  Same Surgeon:  Erlene Quan C. Donzetta Matters, MD Assistant: Risa Grill, PA Procedure Performed:  Left arm first stage basilic vein AV fistula creation  Indications: 73 year old female with end-stage renal disease currently on dialysis via left IJ catheter.  She is now taken for permanent access appears to have suitable basilic vein on the left.  Findings: Basilic vein at the antecubitum had multiple branch points.  Just above the antecubitum approximately 4 mm external diameter.  The artery was moderately diseased but there were 2 areas for clamping.  At completion there was a strong thrill in the fistula palpable radial pulse at the wrist both confirmed with Doppler.   Procedure:  The patient was identified in the holding area and taken to the operating room where MAC anesthesia was initially induced.  She was sterilely prepped and draped in left upper extremity usual fashion antibiotics were minister and timeout was called.  Ultrasound was used to identify the basilic vein which looked much larger above the antecubitum and then at the antecubitum where there were multiple branch points.  The area was anesthetized 1% lidocaine.  A transverse incision was made between the vein and the probable pulse above the elbow.  We dissected down through subcutaneous tissue.  Unfortunate patient could not tolerate due to pain we attempted more local anesthetic.  Ultimately LMA anesthesia was induced.  We dissected out the vein protected the nerve.  The vein was marked orientation.  We dissected through the deep fascia placed Vesseloops on the artery.  The vein was clamped distally and transected.  It was spatulated flushed with heparinized saline.  The arteries clamped distally proximally opened longitudinally flushed heparinized saline both directions.  The vein was then sewn  end-to-side with 6-0 Prolene suture to the arteriotomy.  Prior to completion the left lateral rectus.  Upon completion there was a good thrill in the vein and a palpable radial pulse at the wrist.  Both were confirmed with Doppler.  Satisfied we irrigated the wound closed in layers with Vicryl and Monocryl.  Dermabond was placed to the level of the skin.  She was awake from anesthesia having tolerated procedure without immediate complication.  All counts were correct at completion.  EBL: 25 cc    Kenitha Glendinning C. Donzetta Matters, MD [facility] Office: [phone] Pager: [phone]

## 2019-08-05 NOTE — Transfer of Care (Signed)
Immediate Anesthesia Transfer of Care Note  Patient: [name]  Procedure(s) Performed: ARTERIOVENOUS (AV) FISTULA CREATION LEFT ARM (Left Arm Upper)  Patient Location: PACU  Anesthesia Type:General  Level of Consciousness: awake, alert  and oriented  Airway & Oxygen Therapy: Patient Spontanous Breathing and Patient connected to face mask oxygen  Post-op Assessment: Report given to RN, Post -op Vital signs reviewed and stable and Patient moving all extremities X 4  Post vital signs: Reviewed and stable  Last Vitals:  Vitals Value Taken Time  BP 107/78 08/05/19 1045  Temp    Pulse 84 08/05/19 1045  Resp 23 08/05/19 1045  SpO2 97 % 08/05/19 1045  Vitals shown include unvalidated device data.  Last Pain:  Vitals:   08/05/19 0515  TempSrc: Oral  PainSc:       Patients Stated Pain Goal: 3 (83/33/83 2919)  Complications: No apparent anesthesia complications

## 2019-08-05 NOTE — Progress Notes (Signed)
Hospitalist daily note   [name] [id number] DOB: 06-27-1946 DOA: 07/17/2019  PCP: [name], MD   Narrative:  27 white female CKD 3P COPD chronic respiratory failure OSA/CPAP (on trilogy) DM TY 2 A. fib >Chad score 4 on anticoagulation HTN, some urinary retention, IBS Initially admitted by [facility] after having an episode of coffee-ground emesis nausea vomiting had active bleeding ulcers she was transferred for consideration of CRRT   Data Reviewed:  EGD 12/14 esophagitis Schatzki's ring dilatation Developed A. fib with RVR 12/15 EGD 2/14 07/25/19> Rt femoral triple lumen catheter  07/29/19> L IR tunneled HD cath  BUN/creatinine 30/3.49 potassium 4.1 anion gap 9  Albumin 1.7 Hemoglobin 8.4 260 WBC 6.12  2 D ECHO 1. Left ventricular ejection fraction, by visual estimation, is 55 to 60%. The left ventricle has normal function. There is mildly increased left ventricular hypertrophy. 2. Left ventricular diastolic function could not be evaluated. 3. The left ventricle has no regional wall motion abnormalities. 4. Global right ventricle has normal systolic function.The right ventricular size is normal. No increase in right ventricular wall thickness. 5. Left atrial size was moderately dilated. 6. Right atrial size was mildly dilated. 7. Mild mitral annular calcification. 8. The mitral valve is degenerative. Trivial mitral valve regurgitation. 9. The tricuspid valve is grossly normal. Tricuspid valve regurgitation moderate. 10. The tricuspid valve was normal in structure. Tricuspid valve regurgitation moderate. 11. The aortic valve is grossly normal. Aortic valve regurgitation is not visualized. 12. Moderately elevated pulmonary artery systolic pressure. 13. The tricuspid regurgitant velocity is 3.12 m/s, and with an assumed right atrial pressure of 15 mmHg, the estimated right ventricular systolic pressure is moderately elevated at 54.0 mmHg. 14. The inferior vena cava is  dilated in size with <50% respiratory variability, suggesting right atrial pressure of 15 mmHg. Assessment & Plan: coffee-ground emesis-resolved-now on regular diet-continue Protonix 40 twice daily monitor trends prior to admission was on aspirin 81 which is been held  A. fib RVR [place] > 4  SCDs at this time, Cardizem not continued on admission continue amiodarone 200 daily, metoprolol  75 twice daily [home]  Currently 25 bid Resuming Eliquis am 08/06/19  Chronic respiratory failure and COPD-need desat screen OSA on CPAP-continue CPAP/trilogy when discharge-stable Has used oxygen at home for several years  DM TY 2-CBGs ranging from 95-102-continue sliding scale coverage with 5 units of Lantus  HFpEF-Home meds as above, holding torsemide 50 from prior to admission  Class III obesity-outpatient weight restrictive surgery possible  Urinary incontinence-continue Flomax 0.4 at bedtime  UTI 2/2 Enterobacter--compelted Rx  Gout-continue allopurinol  scd until procedure, Full code, Inpatient, no family  Likely will need SNF--consulted TOC team--lnearing readiness for d/c to SNF  Subjective: No issues Procedure went well No distress no pain abd soft  Consultants:   Cardiology, nephrology, GI Procedures:   As above Antimicrobials:   None   Objective: Vitals:   08/04/19 0739 08/04/19 0853 08/04/19 2050 08/05/19 0515  BP:  97/67 (!) 143/52 132/66  Pulse:  82 84 78  Resp:   18 16  Temp:  98.1 F (36.7 C) 98.1 F (36.7 C) 97.7 F (36.5 C)  TempSrc:  Oral Oral Oral  SpO2: 92% 94% 93% 95%  Weight:   85.8 kg   Height:        Intake/Output Summary (Last 24 hours) at 08/05/2019 0740 Last data filed at 08/05/2019 0601 Gross per 24 hour  Intake 480 ml  Output 0 ml  Net 480  ml   Filed Weights   08/03/19 1220 08/03/19 1624 08/04/19 2050  Weight: 87.1 kg 85.8 kg 85.8 kg    Examination: eomi ncat no focal deficit Thick neck cushingoid s1 s 2no m abd soft nt nd no  rebound No le edema  Scheduled Meds: . allopurinol  100 mg Oral Daily  . amiodarone  200 mg Oral Daily  . [START ON 08/06/2019] apixaban  5 mg Oral BID  . atorvastatin  40 mg Oral QHS  . Chlorhexidine Gluconate Cloth  6 each Topical Daily  . darbepoetin (ARANESP) injection - NON-DIALYSIS  150 mcg Subcutaneous Q Thu-1800  . docusate sodium  100 mg Oral Daily  . feeding supplement (ENSURE ENLIVE)  237 mL Oral BID WC  . feeding supplement (PRO-STAT SUGAR FREE 64)  30 mL Oral BID  . umeclidinium bromide  1 puff Inhalation Daily   And  . fluticasone furoate-vilanterol  1 puff Inhalation Daily  . insulin aspart  0-9 Units Subcutaneous TID WC  . insulin glargine  5 Units Subcutaneous QHS  . mouth rinse  15 mL Mouth Rinse BID  . metoprolol tartrate  25 mg Oral BID  . multivitamin  1 tablet Oral QHS  . pantoprazole  40 mg Oral BID AC  . sertraline  50 mg Oral Daily   Continuous Infusions: . sodium chloride Stopped (07/29/19 1306)     LOS: 18 days   Time spent: [place], MD [facility]

## 2019-08-05 NOTE — Anesthesia Procedure Notes (Signed)
Procedure Name: LMA Insertion Date/Time: 08/05/2019 10:06 AM Performed by: Neldon Newport, CRNA Pre-anesthesia Checklist: Timeout performed, Patient being monitored, Suction available, Emergency Drugs available and Patient identified Patient Re-evaluated:Patient Re-evaluated prior to induction Oxygen Delivery Method: Circle system utilized Preoxygenation: Pre-oxygenation with 100% oxygen Induction Type: IV induction Ventilation: Mask ventilation without difficulty LMA: LMA inserted LMA Size: 4.0 Placement Confirmation: positive ETCO2 and breath sounds checked- equal and bilateral Tube secured with: Tape Dental Injury: Teeth and Oropharynx as per pre-operative assessment  Comments: LMA placed by Dr. Lissa Hoard

## 2019-08-05 NOTE — Progress Notes (Signed)
Pt is in OR and will reattempt at another time.   08/05/19 1000  PT Visit Information  Last PT Received On 08/05/19  Reason Eval/Treat Not Completed Patient at procedure or test/unavailable    Mee Hives, PT MS Acute Rehab Dept. Number: [place] and [place]

## 2019-08-05 NOTE — Progress Notes (Signed)
Renal Navigator met with patient at bedside to further discuss disposition plan. Navigator wanted to clarify if she has been following with a Nephrologist prior to her hospitalization and starting HD. She states she was with Dr. Hinda Lenis. Renal Navigator explained again that there is a [facility] clinic in [place] and in [place] and that Dr. Hinda Lenis is affiliated with the [facility] clinics. Navigator asked if she was ready to choose a location, but patient asked to hold off because she still wants to hear from [facility] regarding the possibility of admission there for rehab before making a decision.  Renal Navigator explained that she needs to have an OP HD clinic before she can discharge and that Navigator does not want this part of her plan to delay her discharge. She states understanding and does not know where she will be located in order for Navigator to submit her referral. Navigator following closely.  Alphonzo Cruise, [place] Renal Navigator [phone]

## 2019-08-05 NOTE — TOC Progression Note (Addendum)
Transition of Care [facility]) - Progression Note    Patient Details  Name: [name] MRN: [id number] Date of Birth: [birth year]  [facility] CM/SW Contact  Sharlet Salina Mila Homer, LCSW Phone Number: 08/05/2019, 2:06 PM  Clinical Narrative:  Met with patient at the bedside regarding her discharge disposition. [name] was sitting up in bed eating lunch, and was agreeable to talking with CSW. [name] confirmed that she came to hospital from [facility] and would like to go to [facility] at discharge. Patient informed that per MD she is nearing readiness for discharge.  Call made to [facility] and spoke with [place]. CSW informed that admissions director is out, however she will review information, contact admissions director and get back with CSW. Did not hear back from [place], and when follow-up call made, and requested to speak with Hildred Alamin, the phone just rang. Patient updated that a bed at [facility] not confirmed.    Expected Discharge Plan: Skilled Nursing Facility Barriers to Discharge: Continued Medical Work up  Expected Discharge Plan and Services Expected Discharge Plan: [place]                                              Social Determinants of Health (SDOH) Interventions  No SDOH interventions needed at this time.  Readmission Risk Interventions Readmission Risk Prevention Plan 07/19/2019 07/08/2019 07/07/2019  Transportation Screening Complete Complete Complete  PCP or Specialist Appt within 3-5 Days - - -  Not Complete comments - - -  HRI or [place] or Home Care Consult comments - - -  Social Work Consult for [place] Planning/Counseling - - -  [place] - - -  Medication Review Press photographer) Complete Complete Complete  PCP or Specialist appointment within 3-5 days of discharge Complete Not Complete -  PCP/Specialist Appt Not Complete comments - Patient  will be seen by medical director at [place] or Home Care Consult Complete Complete -  SW Recovery Care/Counseling Consult Complete Complete Complete  Palliative Care Screening Not Applicable Not Applicable Not Applicable  Skilled Nursing Facility Complete Complete Complete  Some recent data might be hidden

## 2019-08-05 NOTE — NC FL2 (Signed)
[place] LEVEL OF CARE SCREENING TOOL     IDENTIFICATION  Patient Name: [name] Birthdate: 1945-08-30 Sex: female Admission Date (Current Location): 07/17/2019  [facility] and [place] Number:  [employer] and Address:  [facility] [place], [place] [address] [place], [place] [address]      Provider Number: [id number]  Attending Physician Name and Address:  Nita Sells, MD  Relative Name and Phone Number:  Amaryllis Dyke - son, [phone]; Son [name] - [phone]    Current Level of Care: Hospital Recommended Level of Care: [place] Prior Approval Number:    Date Approved/Denied:   PASRR Number: [id number]  Discharge Plan: SNF    Current Diagnoses: Patient Active Problem List   Diagnosis Date Noted  . Acute renal failure (ARF) ([place]) 07/25/2019  . Goals of care, counseling/discussion 07/23/2019  . Acute renal failure superimposed on stage 4 chronic kidney disease ([place]) 07/22/2019  . 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 07/21/2019  . Paroxysmal atrial fibrillation ([employer]) 07/21/2019  . Encephalopathy 07/21/2019  . Moderate major depression, single episode ([place]) 07/21/2019  . Pressure injury of skin 07/20/2019  . GI bleed 07/18/2019  . Acute UTI 07/18/2019  . Coffee ground emesis 07/17/2019  . Anorexia 07/10/2019  . Urinary retention, Female 07/07/2019  . Elevated troponin 07/07/2019  . AKI (acute kidney injury) ([place]) 07/06/2019  . Hypoxia 07/05/2019  . Weight gain with edema 07/02/2019  . 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 CKD stage 3 due to type 2 diabetes mellitus ([place]) 06/22/2019  . Neuropathy due to type 2 diabetes mellitus ([place]) 06/22/2019  . Dyslipidemia associated with type 2 diabetes mellitus ([place]) 06/22/2019  . Chronic gout 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 06/22/2019   . Quality of life palliative care encounter 06/19/2019  . Atrial fibrillation with RVR ([place])   . Atrial flutter with rapid ventricular response ([place]) 06/16/2019  . Dyspnea and respiratory abnormalities 06/01/2019  . Acute on chronic respiratory failure with hypoxia and hypercapnia with respiratory acidosis 06/01/2019  . Anasarca 05/31/2019  . Nausea without vomiting 05/31/2019  . Generalized abdominal pain 05/31/2019  . History of colonic polyps 05/31/2019  . Lymphedema of both lower extremities 04/28/2019  . Chronic respiratory failure with hypoxia ([place]) 07/13/2018  . Acute renal failure superimposed on stage 3 chronic kidney disease ([place]) 07/13/2018  . Chronic diastolic CHF (congestive heart failure) ([place]) 07/13/2018  . COPD (chronic obstructive pulmonary disease) ([place]) 07/13/2018  . COPD exacerbation ([place]) 07/12/2018  . Flatulence 09/18/2017  . IBS (irritable bowel syndrome) 04/03/2017  . Gout 03/15/2017  . Acute on chronic diastolic CHF (congestive heart failure) ([place]) 05/08/2016  . CKD (chronic kidney disease) stage 3, GFR 30-59 ml/min 05/08/2016  . COPD with acute exacerbation ([place]) 05/08/2016  . Constipation 04/25/2016  . Peripheral vertigo 06/27/2015  . Port-A-Cath in place 10/06/2014  . Chronic obstructive pulmonary disease ([place]) 04/30/2014  . Obesity, Class III, BMI 40-49.9 (morbid obesity) ([place]) 04/30/2014  . Hemorrhoids, internal, with bleeding 11/10/2013  . Anemia 08/18/2013  . Hiatal hernia with GERD and esophagitis   . Obstructive chronic bronchitis without exacerbation ([place]) 12/26/2009  . Dysphagia 12/26/2009  . DM type 2 with diabetic peripheral neuropathy ([place]) 12/25/2009  . Vitamin B deficiency 12/25/2009  . Iron deficiency anemia due to chronic blood loss 12/25/2009  . Benign essential HTN 12/25/2009  . DEGENERATIVE DISC DISEASE 12/25/2009  .  OSA on CPAP 12/25/2009  . HLD (hyperlipidemia) 12/25/2009  . Type 2 diabetes with nephropathy ([place]) 12/25/2009     Orientation RESPIRATION BLADDER Height & Weight     Self, Time, Situation, Place  Normal(Patient has a Bipap machine - home Trilogy: AVAPS mode Set Vt of 350, EPAP 8 cm; oxygen 21%; Flow rate 0 lpm) Continent Weight: 189 lb 2.9 oz (85.8 kg) Height:  4\' 11"  (149.9 cm)  BEHAVIORAL SYMPTOMS/MOOD NEUROLOGICAL BOWEL NUTRITION STATUS      Incontinent Diet(Renal with 1200 mL fluid restriction)  AMBULATORY STATUS COMMUNICATION OF NEEDS Skin   Limited Assist(Patient is min guard with ambulation) Verbally Skin abrasions, Other (Comment)(Incision left arm; Abrasioon left leg with foam dressing; Blister left leg with foam dressing; Cellulitis rifht leg with foam dressing; Ecchymosis right/left leg and arm; MASD bilateral buttocks, cleansed and barrier cream applied)                       Personal Care Assistance Level of Assistance  Bathing, Feeding, Dressing Bathing Assistance: Limited assistance Feeding assistance: Independent Dressing Assistance: Limited assistance     Functional Limitations Info  Sight, Hearing, Speech Sight Info: Adequate Hearing Info: Adequate Speech Info: Adequate    SPECIAL CARE FACTORS FREQUENCY  PT (By licensed PT)     PT Frequency: Evaluated at hospital on 12/24. PT at SNF eval and treat, a minimum of 5 days per week              Contractures Contractures Info: Not present    Additional Factors Info  Code Status, Allergies, Insulin Sliding Scale Code Status Info: Full Allergies Info: No known allergies Psychotropic Info: elavil Insulin Sliding Scale Info: 0-9 Units 3 times a day with meals       Current Medications (08/05/2019):  This is the current hospital active medication list Current Facility-Administered Medications  Medication Dose Route Frequency Provider Last Rate Last Admin  . 0.9 %  sodium chloride infusion   Intravenous PRN Barbie Banner, PA-C   Stopped at 07/29/19 1306  . 0.9 %  sodium chloride infusion   Intravenous  Continuous Barbie Banner, PA-C 10 mL/hr at 08/05/19 0850 New Bag at 08/05/19 0850  . acetaminophen (TYLENOL) tablet 650 mg  650 mg Oral Q6H PRN Barbie Banner, PA-C   650 mg at 07/29/19 1620   Or  . acetaminophen (TYLENOL) suppository 650 mg  650 mg Rectal Q6H PRN Barbie Banner, PA-C      . albuterol (PROVENTIL) (2.5 MG/3ML) 0.083% nebulizer solution 2.5 mg  2.5 mg Nebulization Q6H PRN Setzer, Edman Circle, PA-C      . allopurinol (ZYLOPRIM) tablet 100 mg  100 mg Oral Daily Barbie Banner, PA-C   100 mg at 08/04/19 1660  . amiodarone (PACERONE) tablet 200 mg  200 mg Oral Daily Barbie Banner, PA-C   200 mg at 08/04/19 6301  . [START ON 08/06/2019] apixaban (ELIQUIS) tablet 5 mg  5 mg Oral BID Nita Sells, MD      . atorvastatin (LIPITOR) tablet 40 mg  40 mg Oral QHS Barbie Banner, PA-C   40 mg at 08/04/19 2137  . Chlorhexidine Gluconate Cloth 2 % PADS 6 each  6 each Topical Daily Barbie Banner, PA-C   6 each at 08/04/19 2138  . Darbepoetin Alfa (ARANESP) injection 150 mcg  150 mcg Subcutaneous Q Thu-1800 Barbie Banner, PA-C   150 mcg at 07/29/19 1730  . docusate sodium (  COLACE) capsule 100 mg  100 mg Oral Daily Barbie Banner, PA-C   100 mg at 08/04/19 2703  . feeding supplement (ENSURE ENLIVE) (ENSURE ENLIVE) liquid 237 mL  237 mL Oral BID WC Barbie Banner, PA-C   237 mL at 08/03/19 1010  . feeding supplement (PRO-STAT SUGAR FREE 64) liquid 30 mL  30 mL Oral BID Barbie Banner, PA-C   30 mL at 08/04/19 2137  . umeclidinium bromide (INCRUSE ELLIPTA) 62.5 MCG/INH 1 puff  1 puff Inhalation Daily Barbie Banner, PA-C   1 puff at 08/04/19 5009   And  . fluticasone furoate-vilanterol (BREO ELLIPTA) 100-25 MCG/INH 1 puff  1 puff Inhalation Daily Barbie Banner, PA-C   1 puff at 08/04/19 3818  . insulin aspart (novoLOG) injection 0-9 Units  0-9 Units Subcutaneous TID WC Barbie Banner, PA-C   1 Units at 08/02/19 1654  . insulin glargine (LANTUS) injection 5 Units  5  Units Subcutaneous QHS Barbie Banner, PA-C   5 Units at 08/04/19 2137  . MEDLINE mouth rinse  15 mL Mouth Rinse BID Barbie Banner, PA-C   15 mL at 08/04/19 2138  . metoprolol tartrate (LOPRESSOR) tablet 25 mg  25 mg Oral BID Barbie Banner, PA-C   25 mg at 08/05/19 2993  . multivitamin (RENA-VIT) tablet 1 tablet  1 tablet Oral QHS Barbie Banner, PA-C   1 tablet at 08/04/19 2137  . pantoprazole (PROTONIX) EC tablet 40 mg  40 mg Oral BID AC SetzerEdman Circle, PA-C   40 mg at 08/04/19 1718  . sertraline (ZOLOFT) tablet 50 mg  50 mg Oral Daily Barbie Banner, PA-C   50 mg at 08/04/19 7169  . sodium chloride flush (NS) 0.9 % injection 10-40 mL  10-40 mL Intracatheter PRN Barbie Banner, PA-C   10 mL at 08/03/19 1929  . traMADol (ULTRAM) tablet 50 mg  50 mg Oral Q6H PRN Barbie Banner, PA-C         Discharge Medications: Please see discharge summary for a list of discharge medications.  Relevant Imaging Results:  Relevant Lab Results:   Additional Information ss#[id number]  Sable Feil, LCSW

## 2019-08-05 NOTE — Progress Notes (Signed)
  [facility] Progress Note    Assessment/ Plan:   1. AoCKD, now likely ESRD--> AVF and TDC, dialyzing on TTS schedule, continue current 2. UGIB, EGD with esophagitis, schatzki ring, hiatal hernia 3. AFib hx/o RVR, back on apixaban 4. OSA on CPAP 5. DM2 6. Anemia on ESA; already rec Fereheme 7. Obesity 8. COPD 9. CKD BMD: P at goal 10. Dispo: pending  Subjective:      No events overnight. Had AVF placed today with Dr. Donzetta Matters.  For HD today.   Objective:   BP (!) 128/42 (BP Location: Right Arm)   Pulse 80   Temp 97.6 F (36.4 C)   Resp 19   Ht 4\' 11"  (1.499 m)   Wt 85.8 kg   SpO2 93%   BMI 38.21 kg/m   Intake/Output Summary (Last 24 hours) at 08/05/2019 1226 Last data filed at 08/05/2019 1045 Gross per 24 hour  Intake 519.37 ml  Output 10 ml  Net 509.37 ml   Weight change: -1.288 kg  Physical Exam: Gen: NAD CVS: RRR Resp: clear Abd: obese Ext:1+ LE edema.   ACCESS: TDC and LUE AVF + T/B  Imaging: No results found.  Labs: BMET Recent Labs  Lab 07/31/19 0338 08/01/19 0609 08/01/19 0716 08/02/19 0415 08/03/19 0500 08/04/19 0440 08/05/19 0502  NA 138 137 136 135 138 135 137  K 5.0 5.2* 5.1 4.2 4.2 3.6 4.1  CL 104 104 103 99 104 99 99  CO2 26 24 25 28 26 30 29   GLUCOSE 94 98 91 98 94 96 97  BUN 20 30* 29* 20 32* 14 30*  CREATININE 3.72* 4.35* 4.18* 2.68* 3.29* 2.01* 3.49*  CALCIUM 8.1* 8.5* 8.3* 8.0* 7.9* 7.7* 7.9*  PHOS 3.4 4.2 4.1 3.2 3.9 3.1 4.1   CBC Recent Labs  Lab 08/01/19 0716 08/02/19 0415 08/03/19 0500 08/05/19 0502  WBC 9.8 11.7* 11.9* 6.2  HGB 9.0* 9.2* 8.5* 8.4*  HCT 28.3* 28.8* 27.0* 26.7*  MCV 90.4 90.0 91.2 92.7  PLT 205 242 222 260    Medications:    . allopurinol  100 mg Oral Daily  . amiodarone  200 mg Oral Daily  . atorvastatin  40 mg Oral QHS  . Chlorhexidine Gluconate Cloth  6 each Topical Daily  . darbepoetin (ARANESP) injection - NON-DIALYSIS  150 mcg Subcutaneous Q Thu-1800  . docusate sodium   100 mg Oral Daily  . feeding supplement (ENSURE ENLIVE)  237 mL Oral BID WC  . feeding supplement (PRO-STAT SUGAR FREE 64)  30 mL Oral BID  . umeclidinium bromide  1 puff Inhalation Daily   And  . fluticasone furoate-vilanterol  1 puff Inhalation Daily  . insulin aspart  0-9 Units Subcutaneous TID WC  . insulin glargine  5 Units Subcutaneous QHS  . mouth rinse  15 mL Mouth Rinse BID  . metoprolol tartrate  25 mg Oral BID  . multivitamin  1 tablet Oral QHS  . pantoprazole  40 mg Oral BID AC  . sertraline  50 mg Oral Daily      Madelon Lips, MD 08/05/2019, 12:26 PM

## 2019-08-06 LAB — GLUCOSE, CAPILLARY
Glucose-Capillary: 100 mg/dL — ABNORMAL HIGH (ref 70–99)
Glucose-Capillary: 117 mg/dL — ABNORMAL HIGH (ref 70–99)
Glucose-Capillary: 151 mg/dL — ABNORMAL HIGH (ref 70–99)
Glucose-Capillary: 83 mg/dL (ref 70–99)
Glucose-Capillary: 84 mg/dL (ref 70–99)

## 2019-08-06 LAB — RENAL FUNCTION PANEL
Albumin: 1.8 g/dL — ABNORMAL LOW (ref 3.5–5.0)
Anion gap: 5 (ref 5–15)
BUN: 12 mg/dL (ref 8–23)
CO2: 31 mmol/L (ref 22–32)
Calcium: 7.8 mg/dL — ABNORMAL LOW (ref 8.9–10.3)
Chloride: 103 mmol/L (ref 98–111)
Creatinine, Ser: 1.93 mg/dL — ABNORMAL HIGH (ref 0.44–1.00)
GFR calc Af Amer: 29 mL/min — ABNORMAL LOW (ref >60)
GFR calc non Af Amer: 25 mL/min — ABNORMAL LOW (ref >60)
Glucose, Bld: 112 mg/dL — ABNORMAL HIGH (ref 70–99)
Phosphorus: 2.7 mg/dL (ref 2.5–4.6)
Potassium: 4.1 mmol/L (ref 3.5–5.1)
Sodium: 139 mmol/L (ref 135–145)

## 2019-08-06 LAB — MAGNESIUM: Magnesium: 1.8 mg/dL (ref 1.7–2.4)

## 2019-08-06 NOTE — Plan of Care (Signed)
  Problem: Clinical Measurements: Goal: Ability to maintain clinical measurements within normal limits will improve Outcome: Progressing   

## 2019-08-06 NOTE — Progress Notes (Signed)
Patient has home Trilogy at hospital that she is using.  Made sure machine was pulled up to bedside for patient.  Patient self-manages her machine.  No distress noted, will continue to monitor.

## 2019-08-06 NOTE — Progress Notes (Signed)
Placed patient on home Trilogy via AVAPS mode

## 2019-08-06 NOTE — Progress Notes (Signed)
  [facility] Progress Note    Assessment/ Plan:   1. AoCKD, now likely ESRD--> AVF and TDC, dialyzing on TTS schedule, continue current, next planned 08/07/2019 2. UGIB, EGD with esophagitis, schatzki ring, hiatal hernia 3. AFib hx/o RVR, back on apixaban 4. OSA on CPAP 5. DM2 6. Anemia on ESA; already rec Fereheme 7. Obesity 8. COPD 9. CKD BMD: P at goal 10. Dispo: pending  Subjective:      S/p HD last night without incident.  Continue on TTS schedule.  Awaiting SNF   Objective:   BP (!) 108/39 (BP Location: Right Arm)   Pulse 75   Temp 98 F (36.7 C) (Oral)   Resp 18   Ht 4\' 11"  (1.499 m)   Wt 81.6 kg   SpO2 91%   BMI 36.33 kg/m   Intake/Output Summary (Last 24 hours) at 08/06/2019 1351 Last data filed at 08/06/2019 0900 Gross per 24 hour  Intake 528.83 ml  Output 3000 ml  Net -2471.17 ml   Weight change: 1.388 kg  Physical Exam: Gen: NAD CVS: RRR Resp: clear Abd: obese Ext:1+ LE edema.   ACCESS: TDC and LUE AVF + T/B  Imaging: No results found.  Labs: BMET Recent Labs  Lab 08/01/19 0609 08/01/19 0716 08/02/19 0415 08/03/19 0500 08/04/19 0440 08/05/19 0502 08/06/19 0735  NA 137 136 135 138 135 137 139  K 5.2* 5.1 4.2 4.2 3.6 4.1 4.1  CL 104 103 99 104 99 99 103  CO2 24 25 28 26 30 29 31   GLUCOSE 98 91 98 94 96 97 112*  BUN 30* 29* 20 32* 14 30* 12  CREATININE 4.35* 4.18* 2.68* 3.29* 2.01* 3.49* 1.93*  CALCIUM 8.5* 8.3* 8.0* 7.9* 7.7* 7.9* 7.8*  PHOS 4.2 4.1 3.2 3.9 3.1 4.1 2.7   CBC Recent Labs  Lab 08/01/19 0716 08/02/19 0415 08/03/19 0500 08/05/19 0502  WBC 9.8 11.7* 11.9* 6.2  HGB 9.0* 9.2* 8.5* 8.4*  HCT 28.3* 28.8* 27.0* 26.7*  MCV 90.4 90.0 91.2 92.7  PLT 205 242 222 260    Medications:    . allopurinol  100 mg Oral Daily  . amiodarone  200 mg Oral Daily  . apixaban  5 mg Oral BID  . atorvastatin  40 mg Oral QHS  . Chlorhexidine Gluconate Cloth  6 each Topical Daily  . darbepoetin (ARANESP) injection -  NON-DIALYSIS  150 mcg Subcutaneous Q Thu-1800  . docusate sodium  100 mg Oral Daily  . feeding supplement (ENSURE ENLIVE)  237 mL Oral BID WC  . feeding supplement (PRO-STAT SUGAR FREE 64)  30 mL Oral BID  . umeclidinium bromide  1 puff Inhalation Daily   And  . fluticasone furoate-vilanterol  1 puff Inhalation Daily  . insulin aspart  0-9 Units Subcutaneous TID WC  . insulin glargine  5 Units Subcutaneous QHS  . mouth rinse  15 mL Mouth Rinse BID  . metoprolol tartrate  25 mg Oral BID  . multivitamin  1 tablet Oral QHS  . pantoprazole  40 mg Oral BID AC  . sertraline  50 mg Oral Daily      Madelon Lips, MD 08/06/2019, 1:51 PM

## 2019-08-06 NOTE — Progress Notes (Signed)
   VASCULAR SURGERY ASSESSMENT & PLAN:   POD 1 -FIRST STAGE LEFT BASILIC VEIN TRANSPOSITION: She has a good bruit in her AV fistula and good Doppler signals in the right wrist.  Vascular surgery will be available as needed.  Follow-up has already been arranged.  SUBJECTIVE:   No specific complaints.  PHYSICAL EXAM:   Vitals:   08/05/19 2317 08/06/19 0003 08/06/19 0500 08/06/19 0600  BP: (!) 113/49 (!) 114/45  (!) 129/44  Pulse: 71 79  69  Resp: 20 12  20   Temp: 97.6 F (36.4 C) 98.5 F (36.9 C)  98.1 F (36.7 C)  TempSrc: Oral Oral  Oral  SpO2: 93% 95%  91%  Weight: 81.6 kg  81.6 kg   Height:       Audible bruit in her left upper arm fistula. Diminished but palpable radial pulse on the left.  Brisk Doppler signals in the radial and ulnar positions in the left hand. Her incision looks fine.  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 allopurinol  100 mg Oral Daily  . amiodarone  200 mg Oral Daily  . apixaban  5 mg Oral BID  . atorvastatin  40 mg Oral QHS  . Chlorhexidine Gluconate Cloth  6 each Topical Daily  .  darbepoetin (ARANESP) injection - NON-DIALYSIS  150 mcg Subcutaneous Q Thu-1800  . docusate sodium  100 mg Oral Daily  . feeding supplement (ENSURE ENLIVE)  237 mL Oral BID WC  . feeding supplement (PRO-STAT SUGAR FREE 64)  30 mL Oral BID  . umeclidinium bromide  1 puff Inhalation Daily   And  . fluticasone furoate-vilanterol  1 puff Inhalation Daily  . insulin aspart  0-9 Units Subcutaneous TID WC  . insulin glargine  5 Units Subcutaneous QHS  . mouth rinse  15 mL Mouth Rinse BID  . metoprolol tartrate  25 mg Oral BID  . multivitamin  1 tablet Oral QHS  . pantoprazole  40 mg Oral BID AC  . sertraline  50 mg Oral Daily    Deitra Mayo Office: [phone] 08/06/2019

## 2019-08-06 NOTE — Progress Notes (Signed)
Hospitalist daily note   [name] [id number] DOB: 12/21/1945 DOA: 07/17/2019  PCP: [name], MD   Narrative:  70 white female CKD 3P COPD chronic respiratory failure OSA/CPAP (on trilogy) DM TY 2 A. fib >Chad score 4 on anticoagulation HTN, some urinary retention, IBS Initially admitted by [facility] after having an episode of coffee-ground emesis nausea vomiting had active bleeding ulcers she was transferred for consideration of CRRT   Data Reviewed:  EGD 12/14 esophagitis Schatzki's ring dilatation Developed A. fib with RVR 12/15 EGD 2/14 07/25/19> Rt femoral triple lumen catheter  07/29/19> L IR tunneled HD cath  BUN/creatinine 30/3.49 potassium 4.1 anion gap 9  Albumin 1.7 Hemoglobin 8.4 260 WBC 6.12  2 D ECHO 1. Left ventricular ejection fraction, by visual estimation, is 55 to 60%. The left ventricle has normal function. There is mildly increased left ventricular hypertrophy. 2. Left ventricular diastolic function could not be evaluated. 3. The left ventricle has no regional wall motion abnormalities. 4. Global right ventricle has normal systolic function.The right ventricular size is normal. No increase in right ventricular wall thickness. 5. Left atrial size was moderately dilated. 6. Right atrial size was mildly dilated. 7. Mild mitral annular calcification. 8. The mitral valve is degenerative. Trivial mitral valve regurgitation. 9. The tricuspid valve is grossly normal. Tricuspid valve regurgitation moderate. 10. The tricuspid valve was normal in structure. Tricuspid valve regurgitation moderate. 11. The aortic valve is grossly normal. Aortic valve regurgitation is not visualized. 12. Moderately elevated pulmonary artery systolic pressure. 13. The tricuspid regurgitant velocity is 3.12 m/s, and with an assumed right atrial pressure of 15 mmHg, the estimated right ventricular systolic pressure is moderately elevated at 54.0 mmHg. 14. The inferior vena cava is  dilated in size with <50% respiratory variability, suggesting right atrial pressure of 15 mmHg. Assessment & Plan: coffee-ground emesis-resolved-now on regular diet-continue Protonix 40 twice daily monitor trends prior to admission was on aspirin 81 which is been held  A. fib RVR [place] > 4  Cardizem not continued on admission continue amiodarone 200 daily, metoprolol  75 twice daily [home]  Currently 25 bid Resuming Eliquis am 08/06/19 Stable in NSR   Chronic respiratory failure and COPD-need desat screen OSA on CPAP-continue CPAP/trilogy when discharge-stable Has used oxygen at home for several years  DM TY 2-CBGs ranging from 95-102-continue sliding scale coverage with 5 units of Lantus  HFpEF-Home meds as above, holding torsemide 50 from prior to admission  Class III obesity-outpatient weight restrictive surgery possible  Urinary incontinence-continue Flomax 0.4 at bedtime  UTI 2/2 Enterobacter--compelted Rx  Gout-continue allopurinol  scd until procedure, Full code, Inpatient, no family  Likely will need SNF--consulted TOC team--lnearing readiness for d/c to SNF  Subjective: Doing fair with no distress no new issues no fever no chills no n/v/cp  Consultants:   Cardiology, nephrology, GI Procedures:   As above Antimicrobials:   None   Objective: Vitals:   08/06/19 0600 08/06/19 0857 08/06/19 0858 08/06/19 0940  BP: (!) 129/44   (!) 108/39  Pulse: 69   75  Resp: 20   18  Temp: 98.1 F (36.7 C)   98 F (36.7 C)  TempSrc: Oral   Oral  SpO2: 91% 90% 91% 91%  Weight:      Height:        Intake/Output Summary (Last 24 hours) at 08/06/2019 1513 Last data filed at 08/06/2019 0900 Gross per 24 hour  Intake 528.83 ml  Output 3000 ml  Net -2471.17  ml   Filed Weights   08/05/19 1910 08/05/19 2317 08/06/19 0500  Weight: 87.2 kg 81.6 kg 81.6 kg    Examination: No new changes   eomi ncat no focal deficit Thick neck cushingoid s1 s 2no m abd soft nt nd no  rebound No le edema  Scheduled Meds: . allopurinol  100 mg Oral Daily  . amiodarone  200 mg Oral Daily  . apixaban  5 mg Oral BID  . atorvastatin  40 mg Oral QHS  . Chlorhexidine Gluconate Cloth  6 each Topical Daily  . darbepoetin (ARANESP) injection - NON-DIALYSIS  150 mcg Subcutaneous Q Thu-1800  . docusate sodium  100 mg Oral Daily  . feeding supplement (ENSURE ENLIVE)  237 mL Oral BID WC  . feeding supplement (PRO-STAT SUGAR FREE 64)  30 mL Oral BID  . umeclidinium bromide  1 puff Inhalation Daily   And  . fluticasone furoate-vilanterol  1 puff Inhalation Daily  . insulin aspart  0-9 Units Subcutaneous TID WC  . insulin glargine  5 Units Subcutaneous QHS  . mouth rinse  15 mL Mouth Rinse BID  . metoprolol tartrate  25 mg Oral BID  . multivitamin  1 tablet Oral QHS  . pantoprazole  40 mg Oral BID AC  . sertraline  50 mg Oral Daily   Continuous Infusions: . sodium chloride Stopped (07/29/19 1306)  . sodium chloride 10 mL/hr at 08/05/19 0850     LOS: 19 days   Time spent: [place], MD [facility]

## 2019-08-07 LAB — GLUCOSE, CAPILLARY
Glucose-Capillary: 92 mg/dL (ref 70–99)
Glucose-Capillary: 88 mg/dL (ref 70–99)
Glucose-Capillary: 117 mg/dL — ABNORMAL HIGH (ref 70–99)
Glucose-Capillary: 120 mg/dL — ABNORMAL HIGH (ref 70–99)

## 2019-08-07 LAB — RENAL FUNCTION PANEL
Albumin: 1.8 g/dL — ABNORMAL LOW (ref 3.5–5.0)
Anion gap: 9 (ref 5–15)
BUN: 24 mg/dL — ABNORMAL HIGH (ref 8–23)
CO2: 29 mmol/L (ref 22–32)
Calcium: 8.1 mg/dL — ABNORMAL LOW (ref 8.9–10.3)
Chloride: 101 mmol/L (ref 98–111)
Creatinine, Ser: 3.42 mg/dL — ABNORMAL HIGH (ref 0.44–1.00)
GFR calc Af Amer: 15 mL/min — ABNORMAL LOW (ref >60)
GFR calc non Af Amer: 13 mL/min — ABNORMAL LOW (ref >60)
Glucose, Bld: 94 mg/dL (ref 70–99)
Phosphorus: 3.4 mg/dL (ref 2.5–4.6)
Potassium: 4.1 mmol/L (ref 3.5–5.1)
Sodium: 139 mmol/L (ref 135–145)

## 2019-08-07 LAB — CBC
HCT: 28.2 % — ABNORMAL LOW (ref 36.0–46.0)
Hemoglobin: 8.7 g/dL — ABNORMAL LOW (ref 12.0–15.0)
MCH: 28.7 pg (ref 26.0–34.0)
MCHC: 30.9 g/dL (ref 30.0–36.0)
MCV: 93.1 fL (ref 80.0–100.0)
Platelets: 272 10*3/uL (ref 150–400)
RBC: 3.03 MIL/uL — ABNORMAL LOW (ref 3.87–5.11)
RDW: 20 % — ABNORMAL HIGH (ref 11.5–15.5)
WBC: 6.2 10*3/uL (ref 4.0–10.5)
nRBC: 0 % (ref 0.0–0.2)

## 2019-08-07 LAB — MAGNESIUM: Magnesium: 1.9 mg/dL (ref 1.7–2.4)

## 2019-08-07 LAB — PHOSPHORUS: Phosphorus: 3.4 mg/dL (ref 2.5–4.6)

## 2019-08-07 MED ORDER — HEPARIN SODIUM (PORCINE) 1000 UNIT/ML IJ SOLN
INTRAMUSCULAR | Status: AC
  Administered 2019-08-07: 3800 [IU] via INTRAVENOUS
  Filled 2019-08-07: qty 4

## 2019-08-07 NOTE — Progress Notes (Signed)
Hospitalist daily note   [name] [id number] DOB: 23-Dec-1945 DOA: 07/17/2019  PCP: [name], MD   Narrative:  44 white female CKD 3P COPD chronic respiratory failure OSA/CPAP (on trilogy) DM TY 2 A. fib >Chad score 4 on anticoagulation HTN, some urinary retention, IBS Initially admitted by [facility] after having an episode of coffee-ground emesis nausea vomiting had active bleeding ulcers she was transferred for consideration of CRRT   Data Reviewed:  EGD 12/14 esophagitis Schatzki's ring dilatation Developed A. fib with RVR 12/15 EGD 2/14 07/25/19> Rt femoral triple lumen catheter  07/29/19> L IR tunneled HD cath  BUN/creatinine 30/3.49 potassium 4.1 anion gap 9  Albumin 1.7 Hemoglobin 8.4 260 WBC 6.12  2 D ECHO 1. Left ventricular ejection fraction, by visual estimation, is 55 to 60%. The left ventricle has normal function. There is mildly increased left ventricular hypertrophy. 2. Left ventricular diastolic function could not be evaluated. right ventricular systolic pressure is moderately elevated at 54.0 mmHg. 14. The inferior vena cava is dilated in size with <50% respiratory variability, suggesting right atrial pressure of 15 mmHg. Assessment & Plan: Coffee-ground emesis-resolved-now on regular diet-continue Protonix 40 twice daily monitor trends prior to admission was on aspirin 81 which is been held-resume on discharge  A. fib RVR [place] > 4  Cardizem not continued on admission continue amiodarone 200 daily, metoprolol  75 twice daily [home]  Currently 25 bid On Eliquis now 5 twice daily Stable in NSR --d/c tele  Chronic respiratory failure and COPD-need desat screen OSA on CPAP-continue CPAP/trilogy when discharge-stable Has used oxygen at home for several years  DM TY 2-CBGs ranging from 90 range-continue sliding scale coverage with 5 units of Lantus-monitor trends  HFpEF-Home meds as above, holding torsemide 50 from prior to admission  Class III  obesity-outpatient weight restrictive surgery possible  Urinary incontinence-continue Flomax 0.4 at bedtime  UTI 2/2 Enterobacter--compelted Rx  Gout-continue allopurinol  scd until procedure, Full code, Inpatient, no family  Likely will need SNF--consulted TOC team--lnearing readiness for d/c to SNF when bed is availbale  Subjective:  Awake alert coherent seen on HD unit no issues at that time  Consultants:   Cardiology, nephrology, GI Procedures:   As above Antimicrobials:   None   Objective: Vitals:   08/07/19 0900 08/07/19 0930 08/07/19 1000 08/07/19 1030  BP: (!) 107/47 (!) 110/48 (!) 99/42 (!) 105/45  Pulse: 72 71 76 74  Resp:      Temp:      TempSrc:      SpO2:      Weight:      Height:        Intake/Output Summary (Last 24 hours) at 08/07/2019 1123 Last data filed at 08/07/2019 0600 Gross per 24 hour  Intake 960 ml  Output 0 ml  Net 960 ml   Filed Weights   08/06/19 0500 08/06/19 2109 08/07/19 0715  Weight: 81.6 kg 82.6 kg 83 kg    Examination:  Pleasant awake alert no distress EOMI NCAT no focal deficit Chest clinically clear no added sound no rales no rhonchi Abdomen soft no rebound no guarding No lower extremity edema No focal deficit Chest port in left chest  Scheduled Meds: . allopurinol  100 mg Oral Daily  . amiodarone  200 mg Oral Daily  . apixaban  5 mg Oral BID  . atorvastatin  40 mg Oral QHS  . Chlorhexidine Gluconate Cloth  6 each Topical Daily  . darbepoetin (ARANESP) injection - NON-DIALYSIS  150 mcg Subcutaneous Q Thu-1800  . docusate sodium  100 mg Oral Daily  . feeding supplement (ENSURE ENLIVE)  237 mL Oral BID WC  . feeding supplement (PRO-STAT SUGAR FREE 64)  30 mL Oral BID  . umeclidinium bromide  1 puff Inhalation Daily   And  . fluticasone furoate-vilanterol  1 puff Inhalation Daily  . insulin aspart  0-9 Units Subcutaneous TID WC  . insulin glargine  5 Units Subcutaneous QHS  . mouth rinse  15 mL Mouth Rinse BID  .  metoprolol tartrate  25 mg Oral BID  . multivitamin  1 tablet Oral QHS  . pantoprazole  40 mg Oral BID AC  . sertraline  50 mg Oral Daily   Continuous Infusions: . sodium chloride Stopped (07/29/19 1306)  . sodium chloride 10 mL/hr at 08/05/19 0850     LOS: 20 days   Time spent: [place], MD [facility]

## 2019-08-07 NOTE — Progress Notes (Signed)
  [facility] Progress Note    Assessment/ Plan:   1. AoCKD, now likely ESRD--> AVF and TDC, dialyzing on TTS schedule, continue current, HD today 08/07/2019 2. UGIB, EGD with esophagitis, schatzki ring, hiatal hernia 3. AFib hx/o RVR, back on apixaban 4. OSA on CPAP 5. DM2 6. Anemia on ESA; already rec Fereheme 7. Obesity 8. COPD 9. CKD BMD: P at goal 10. Dispo: pending--> awaiting SNF bed and therefore HD unit  Subjective:      HD today, good UF goal, no complaints.   Objective:   BP (!) 108/41 (BP Location: Right Arm)   Pulse 72   Temp 98.1 F (36.7 C) (Oral)   Resp 18   Ht 4\' 11"  (1.499 m)   Wt 83 kg   SpO2 100%   BMI 36.96 kg/m   Intake/Output Summary (Last 24 hours) at 08/07/2019 1222 Last data filed at 08/07/2019 0600 Gross per 24 hour  Intake 960 ml  Output 0 ml  Net 960 ml   Weight change: -4.6 kg  Physical Exam: Gen: NAD CVS: RRR Resp: clear Abd: obese Ext:1+ LE edema.   ACCESS: TDC and LUE AVF + T/B  Imaging: No results found.  Labs: BMET Recent Labs  Lab 08/01/19 0716 08/02/19 0415 08/03/19 0500 08/04/19 0440 08/05/19 0502 08/06/19 0735 08/07/19 0444  NA 136 135 138 135 137 139 139  K 5.1 4.2 4.2 3.6 4.1 4.1 4.1  CL 103 99 104 99 99 103 101  CO2 25 28 26 30 29 31 29   GLUCOSE 91 98 94 96 97 112* 94  BUN 29* 20 32* 14 30* 12 24*  CREATININE 4.18* 2.68* 3.29* 2.01* 3.49* 1.93* 3.42*  CALCIUM 8.3* 8.0* 7.9* 7.7* 7.9* 7.8* 8.1*  PHOS 4.1 3.2 3.9 3.1 4.1 2.7 3.4  3.4   CBC Recent Labs  Lab 08/02/19 0415 08/03/19 0500 08/05/19 0502 08/07/19 0742  WBC 11.7* 11.9* 6.2 6.2  HGB 9.2* 8.5* 8.4* 8.7*  HCT 28.8* 27.0* 26.7* 28.2*  MCV 90.0 91.2 92.7 93.1  PLT 242 222 260 272    Medications:    . allopurinol  100 mg Oral Daily  . amiodarone  200 mg Oral Daily  . apixaban  5 mg Oral BID  . atorvastatin  40 mg Oral QHS  . Chlorhexidine Gluconate Cloth  6 each Topical Daily  . darbepoetin (ARANESP) injection -  NON-DIALYSIS  150 mcg Subcutaneous Q Thu-1800  . docusate sodium  100 mg Oral Daily  . feeding supplement (ENSURE ENLIVE)  237 mL Oral BID WC  . feeding supplement (PRO-STAT SUGAR FREE 64)  30 mL Oral BID  . umeclidinium bromide  1 puff Inhalation Daily   And  . fluticasone furoate-vilanterol  1 puff Inhalation Daily  . insulin aspart  0-9 Units Subcutaneous TID WC  . insulin glargine  5 Units Subcutaneous QHS  . mouth rinse  15 mL Mouth Rinse BID  . metoprolol tartrate  25 mg Oral BID  . multivitamin  1 tablet Oral QHS  . pantoprazole  40 mg Oral BID AC  . sertraline  50 mg Oral Daily      Madelon Lips, MD 08/07/2019, 12:22 PM

## 2019-08-07 NOTE — TOC Progression Note (Signed)
Transition of Care [facility]) - Progression Note    Patient Details  Name: [name] MRN: [id number] Date of Birth: [birth year]  Transition of Care [facility]) CM/SW [place], [place] Phone Number: 08/07/2019, 12:17 PM  Clinical Narrative:     CSW called and spoke with Larene Beach at [facility]. She stated that she would be able to admit the patient on Monday.   CSW faxed over updated clinicals. CSW will continue to follow and assist with discharge planning.   Expected Discharge Plan: Skilled Nursing Facility Barriers to Discharge: Continued Medical Work up  Expected Discharge Plan and Services Expected Discharge Plan: [place]                                               Social Determinants of Health (SDOH) Interventions    Readmission Risk Interventions Readmission Risk Prevention Plan 07/19/2019 07/08/2019 07/07/2019  Transportation Screening Complete Complete Complete  PCP or Specialist Appt within 3-5 Days - - -  Not Complete comments - - -  HRI or [place] or Home Care Consult comments - - -  Social Work Scientific laboratory technician for [place] Planning/Counseling - - -  Palliative Care Screening - - -  Medication Review Press photographer) Complete Complete Complete  PCP or Specialist appointment within 3-5 days of discharge Complete Not Complete -  PCP/Specialist Appt Not Complete comments - Patient will be seen by medical director at [place] or Home Care Consult Complete Complete -  SW Recovery Care/Counseling Consult Complete Complete Complete  Palliative Care Screening Not Applicable Not Applicable Not Applicable  Skilled Nursing Facility Complete Complete Complete  Some recent data might be hidden

## 2019-08-08 LAB — GLUCOSE, CAPILLARY
Glucose-Capillary: 132 mg/dL — ABNORMAL HIGH (ref 70–99)
Glucose-Capillary: 89 mg/dL (ref 70–99)
Glucose-Capillary: 110 mg/dL — ABNORMAL HIGH (ref 70–99)
Glucose-Capillary: 123 mg/dL — ABNORMAL HIGH (ref 70–99)

## 2019-08-08 LAB — RENAL FUNCTION PANEL
Albumin: 1.9 g/dL — ABNORMAL LOW (ref 3.5–5.0)
Anion gap: 7 (ref 5–15)
BUN: 17 mg/dL (ref 8–23)
CO2: 30 mmol/L (ref 22–32)
Calcium: 8.2 mg/dL — ABNORMAL LOW (ref 8.9–10.3)
Chloride: 100 mmol/L (ref 98–111)
Creatinine, Ser: 2.43 mg/dL — ABNORMAL HIGH (ref 0.44–1.00)
GFR calc Af Amer: 22 mL/min — ABNORMAL LOW (ref >60)
GFR calc non Af Amer: 19 mL/min — ABNORMAL LOW (ref >60)
Glucose, Bld: 106 mg/dL — ABNORMAL HIGH (ref 70–99)
Phosphorus: 2.8 mg/dL (ref 2.5–4.6)
Potassium: 3.9 mmol/L (ref 3.5–5.1)
Sodium: 137 mmol/L (ref 135–145)

## 2019-08-08 LAB — SARS CORONAVIRUS 2 (TAT 6-24 HRS): SARS Coronavirus 2: NEGATIVE

## 2019-08-08 LAB — PHOSPHORUS: Phosphorus: 2.8 mg/dL (ref 2.5–4.6)

## 2019-08-08 LAB — MAGNESIUM: Magnesium: 1.8 mg/dL (ref 1.7–2.4)

## 2019-08-08 MED ORDER — KIDNEY FAILURE BOOK
Freq: Once | Status: DC
  Filled 2019-08-08: qty 1

## 2019-08-08 NOTE — Progress Notes (Signed)
Patient to self administer BiPap QHS for OSA.  Patient is familiar with equipment and procedure and will use her own Trilogy and equipment from home.  Patient will contact RT with any issues should they arise.

## 2019-08-08 NOTE — Progress Notes (Signed)
  [facility] Progress Note    Assessment/ Plan:   1. AoCKD, now likely ESRD--> AVF and TDC, dialyzing on TTS schedule, continue current, HD today 08/07/2019, next planned for Tuesday 08/10/2019. 2. UGIB, EGD with esophagitis, schatzki ring, hiatal hernia 3. AFib hx/o RVR, back on apixaban 4. OSA on CPAP 5. DM2 6. Anemia on ESA; already rec Fereheme 7. Obesity 8. COPD 9. CKD BMD: P at goal 10. Dispo: pending--> awaiting SNF bed and therefore HD unit (likely will discharge to [place] unit)  Subjective:      No complaints, awaiting SNF bed   Objective:   BP (!) 134/47 (BP Location: Right Arm)   Pulse 72   Temp 98.7 F (37.1 C) (Oral)   Resp 18   Ht 4\' 11"  (1.499 m)   Wt 80.3 kg   SpO2 100%   BMI 35.76 kg/m   Intake/Output Summary (Last 24 hours) at 08/08/2019 1230 Last data filed at 08/08/2019 0800 Gross per 24 hour  Intake 1200 ml  Output 0 ml  Net 1200 ml   Weight change: 0.4 kg  Physical Exam: Gen: NAD CVS: RRR Resp: clear Abd: obese Ext:1+ LE edema.   ACCESS: TDC and LUE AVF + T/B  Imaging: No results found.  Labs: BMET Recent Labs  Lab 08/02/19 0415 08/03/19 0500 08/04/19 0440 08/05/19 0502 08/06/19 0735 08/07/19 0444 08/08/19 0349  NA 135 138 135 137 139 139 137  K 4.2 4.2 3.6 4.1 4.1 4.1 3.9  CL 99 104 99 99 103 101 100  CO2 28 26 30 29 31 29 30   GLUCOSE 98 94 96 97 112* 94 106*  BUN 20 32* 14 30* 12 24* 17  CREATININE 2.68* 3.29* 2.01* 3.49* 1.93* 3.42* 2.43*  CALCIUM 8.0* 7.9* 7.7* 7.9* 7.8* 8.1* 8.2*  PHOS 3.2 3.9 3.1 4.1 2.7 3.4  3.4 2.8  2.8   CBC Recent Labs  Lab 08/02/19 0415 08/03/19 0500 08/05/19 0502 08/07/19 0742  WBC 11.7* 11.9* 6.2 6.2  HGB 9.2* 8.5* 8.4* 8.7*  HCT 28.8* 27.0* 26.7* 28.2*  MCV 90.0 91.2 92.7 93.1  PLT 242 222 260 272    Medications:    . allopurinol  100 mg Oral Daily  . amiodarone  200 mg Oral Daily  . apixaban  5 mg Oral BID  . atorvastatin  40 mg Oral QHS  . Chlorhexidine  Gluconate Cloth  6 each Topical Daily  . darbepoetin (ARANESP) injection - NON-DIALYSIS  150 mcg Subcutaneous Q Thu-1800  . docusate sodium  100 mg Oral Daily  . feeding supplement (ENSURE ENLIVE)  237 mL Oral BID WC  . feeding supplement (PRO-STAT SUGAR FREE 64)  30 mL Oral BID  . umeclidinium bromide  1 puff Inhalation Daily   And  . fluticasone furoate-vilanterol  1 puff Inhalation Daily  . insulin aspart  0-9 Units Subcutaneous TID WC  . insulin glargine  5 Units Subcutaneous QHS  . kidney failure book   Does not apply Once  . mouth rinse  15 mL Mouth Rinse BID  . metoprolol tartrate  25 mg Oral BID  . multivitamin  1 tablet Oral QHS  . pantoprazole  40 mg Oral BID AC  . sertraline  50 mg Oral Daily      Madelon Lips, MD 08/08/2019, 12:30 PM

## 2019-08-08 NOTE — Progress Notes (Signed)
Hospitalist daily note   [name] [id number] DOB: 03-31-1946 DOA: 07/17/2019  PCP: [name], MD   Narrative:  69 white female CKD 3P COPD chronic respiratory failure OSA/CPAP (on trilogy) DM TY 2 A. fib >Chad score 4 on anticoagulation HTN, some urinary retention, IBS Initially admitted by [facility] after having an episode of coffee-ground emesis nausea vomiting had active bleeding ulcers she was transferred for consideration of CRRT   Data Reviewed:  EGD 12/14 esophagitis Schatzki's ring dilatation Developed A. fib with RVR 12/15 EGD 2/14 07/25/19> Rt femoral triple lumen catheter  07/29/19> L IR tunneled HD cath  BUN/creatinine 30/3.49 -->17/2.43 potassium 4.1->3.9  Hemoglobin 8.4->8.7 260 WBC 6.2  2 D ECHO 1. Left ventricular ejection fraction, by visual estimation, is 55 to 60%. The left ventricle has normal function. There is mildly increased left ventricular hypertrophy. 2. Left ventricular diastolic function could not be evaluated. right ventricular systolic pressure is moderately elevated at 54.0 mmHg. 14. The inferior vena cava is dilated in size with <50% respiratory variability, suggesting right atrial pressure of 15 mmHg. Assessment & Plan: Coffee-ground emesis-resolved-now on regular diet-continue Protonix 40 twice daily monitor trends prior to admission was on aspirin 81 which is been held-resume on discharge  A. fib RVR [place] > 4  Cardizem not continued on admission continue amiodarone 200 daily, metoprolol  75 twice daily [home]  Currently 25 bid On Eliquis now 5 twice daily Stable in NSR --d/c tele  Chronic respiratory failure and COPD-need desat screen OSA on CPAP-continue CPAP/trilogy when discharge-stable Has used oxygen at home for several years  DM TY 2-CBGs ranging from 106-132 ange-continue sliding scale coverage with 5 units of Lantus-monitor trends  HFpEF-Home meds as above, holding torsemide 50 from prior to admission  Class III  obesity-outpatient weight restrictive surgery possible  Urinary incontinence-continue Flomax 0.4 at bedtime  UTI 2/2 Enterobacter--finished Rx  Gout-continue allopurinol  scd until procedure, Full code, Inpatient, no family  Likely will need SNF--clikely can d/c to SNF 1/4 once covid testing resulted  Subjective:  Awake coherent in nad no focal deficit eating drijnking Many q's about CKD related food restircionts  Consultants:   Cardiology, nephrology, GI Procedures:   As above Antimicrobials:   None   Objective: Vitals:   08/08/19 0511 08/08/19 0733 08/08/19 0758 08/08/19 1001  BP: 124/63   (!) 134/47  Pulse: 64 64 64 72  Resp: 16 16 16 18   Temp: 98.3 F (36.8 C)   98.7 F (37.1 C)  TempSrc: Oral   Oral  SpO2: 94%   100%  Weight:      Height:        Intake/Output Summary (Last 24 hours) at 08/08/2019 1053 Last data filed at 08/08/2019 0600 Gross per 24 hour  Intake 900 ml  Output 3000 ml  Net -2100 ml   Filed Weights   08/07/19 0715 08/07/19 1133 08/07/19 2054  Weight: 83 kg 79.6 kg 80.3 kg    Examination:  Awake coherent in nad no focal deficit Sitting in bed cta b no added sound no rales no rhonchi Neuro intact not swollen  Scheduled Meds: . allopurinol  100 mg Oral Daily  . amiodarone  200 mg Oral Daily  . apixaban  5 mg Oral BID  . atorvastatin  40 mg Oral QHS  . Chlorhexidine Gluconate Cloth  6 each Topical Daily  . darbepoetin (ARANESP) injection - NON-DIALYSIS  150 mcg Subcutaneous Q Thu-1800  . docusate sodium  100 mg Oral Daily  .  feeding supplement (ENSURE ENLIVE)  237 mL Oral BID WC  . feeding supplement (PRO-STAT SUGAR FREE 64)  30 mL Oral BID  . umeclidinium bromide  1 puff Inhalation Daily   And  . fluticasone furoate-vilanterol  1 puff Inhalation Daily  . insulin aspart  0-9 Units Subcutaneous TID WC  . insulin glargine  5 Units Subcutaneous QHS  . kidney failure book   Does not apply Once  . mouth rinse  15 mL Mouth Rinse BID   . metoprolol tartrate  25 mg Oral BID  . multivitamin  1 tablet Oral QHS  . pantoprazole  40 mg Oral BID AC  . sertraline  50 mg Oral Daily   Continuous Infusions: . sodium chloride Stopped (07/29/19 1306)  . sodium chloride 10 mL/hr at 08/05/19 0850     LOS: 21 days   Time spent: [place], MD [facility]

## 2019-08-09 ENCOUNTER — Encounter: Payer: Self-pay | Admitting: *Deleted

## 2019-08-09 LAB — RENAL FUNCTION PANEL
Albumin: 1.9 g/dL — ABNORMAL LOW (ref 3.5–5.0)
Anion gap: 8 (ref 5–15)
BUN: 33 mg/dL — ABNORMAL HIGH (ref 8–23)
CO2: 28 mmol/L (ref 22–32)
Calcium: 8.3 mg/dL — ABNORMAL LOW (ref 8.9–10.3)
Chloride: 100 mmol/L (ref 98–111)
Creatinine, Ser: 3.91 mg/dL — ABNORMAL HIGH (ref 0.44–1.00)
GFR calc Af Amer: 12 mL/min — ABNORMAL LOW (ref >60)
GFR calc non Af Amer: 11 mL/min — ABNORMAL LOW (ref >60)
Glucose, Bld: 122 mg/dL — ABNORMAL HIGH (ref 70–99)
Phosphorus: 3.5 mg/dL (ref 2.5–4.6)
Potassium: 4.1 mmol/L (ref 3.5–5.1)
Sodium: 136 mmol/L (ref 135–145)

## 2019-08-09 LAB — PHOSPHORUS: Phosphorus: 3.6 mg/dL (ref 2.5–4.6)

## 2019-08-09 LAB — GLUCOSE, CAPILLARY
Glucose-Capillary: 117 mg/dL — ABNORMAL HIGH (ref 70–99)
Glucose-Capillary: 100 mg/dL — ABNORMAL HIGH (ref 70–99)
Glucose-Capillary: 111 mg/dL — ABNORMAL HIGH (ref 70–99)
Glucose-Capillary: 113 mg/dL — ABNORMAL HIGH (ref 70–99)

## 2019-08-09 LAB — MAGNESIUM: Magnesium: 1.9 mg/dL (ref 1.7–2.4)

## 2019-08-09 MED ORDER — INSULIN ASPART 100 UNIT/ML ~~LOC~~ SOLN: 0.0000 [IU] | Freq: Three times a day (TID) | SUBCUTANEOUS | 11 refills | Status: DC

## 2019-08-09 MED ORDER — VITAMIN C 500 MG PO TABS: 500.0000 mg | ORAL_TABLET | Freq: Every day | ORAL | 0 refills | Status: AC

## 2019-08-09 MED ORDER — ALLOPURINOL 100 MG PO TABS: 100.0000 mg | ORAL_TABLET | Freq: Two times a day (BID) | ORAL | Status: DC

## 2019-08-09 MED ORDER — AMIODARONE HCL 200 MG PO TABS: 200.0000 mg | ORAL_TABLET | Freq: Every day | ORAL | Status: DC

## 2019-08-09 MED ORDER — METOPROLOL TARTRATE 25 MG PO TABS: 25.0000 mg | ORAL_TABLET | Freq: Two times a day (BID) | ORAL | Status: DC

## 2019-08-09 MED ORDER — SERTRALINE HCL 50 MG PO TABS: 50.0000 mg | ORAL_TABLET | Freq: Every day | ORAL | 0 refills | Status: DC

## 2019-08-09 MED ORDER — ASPIRIN EC 81 MG PO TBEC
81.0000 mg | DELAYED_RELEASE_TABLET | Freq: Every day | ORAL | Status: DC
  Administered 2019-08-09 – 2019-08-11 (×3): 81 mg via ORAL
  Filled 2019-08-09 (×3): qty 1

## 2019-08-09 NOTE — Plan of Care (Signed)
  Problem: Activity: Goal: Risk for activity intolerance will decrease Outcome: Progressing   

## 2019-08-09 NOTE — Progress Notes (Signed)
Nutrition Education Note  Pt to discharge to SNF today. RD consulted for renal diet education.   RD consulted for Renal Education. Provided Renal Pyramid patient/family. Reviewed food groups and provided written recommended serving sizes specifically determined for patient's current nutritional status.   Explained why diet restrictions are needed and provided lists of foods to limit/avoid that are high potassium, sodium, and phosphorus. Provided specific recommendations on safer alternatives of these foods. Strongly encouraged compliance of this diet.   Discussed importance of protein intake at each meal and snack. Provided examples of how to maximize protein intake throughout the day. Discussed need for fluid restriction with dialysis, importance of minimizing weight gain between HD treatments, and renal-friendly beverage options.  Encouraged pt to discuss specific diet questions/concerns with RD at HD outpatient facility. Teach back method used.  Pt had questions about eating beans (black eyed peas, pintos, black beans) and certain types of vegetables while on HD. RD went over renal pyramid and which foods are high/low in phosphorus, potassium, and sodium. Reiterated the importance of protein intake moving forward. Answered all questions.   Expect good compliance.  Body mass index is 35.93 kg/m. Pt meets criteria for obese based on current BMI.  Current diet order is renal diet, patient is consuming approximately 50% of meals at this time. Labs and medications reviewed. No further nutrition interventions warranted at this time. RD contact information provided. If additional nutrition issues arise, please re-consult RD.  Mariana Single RD, LDN Clinical Nutrition Pager # [phone]

## 2019-08-09 NOTE — Progress Notes (Signed)
Hospitalist daily note   [name] [id number] DOB: 1945/12/01 DOA: 07/17/2019  PCP: [name], MD   Narrative:  36 white female CKD 3P COPD chronic respiratory failure OSA/CPAP (on trilogy) DM TY 2 A. fib >Chad score 4 on anticoagulation HTN, some urinary retention, IBS Initially admitted by [facility] after having an episode of coffee-ground emesis nausea vomiting had active bleeding ulcers she was transferred for consideration of CRRT  Patient has been stable for the past several days awaiting TOC team planning for [facility] d./c  Data Reviewed:  EGD 12/14 esophagitis Schatzki's ring dilatation Developed A. fib with RVR 12/15 EGD 2/14 07/25/19> Rt femoral triple lumen catheter  07/29/19> L IR tunneled HD cath  BUN/creatinine 30/3.49 -->17/2.43 potassium 4.1->3.9  Hemoglobin 8.4->8.7 260 WBC 6.2  2 D ECHO 1. Left ventricular ejection fraction, by visual estimation, is 55 to 60%. The left ventricle has normal function. There is mildly increased left ventricular hypertrophy. 2. Left ventricular diastolic function could not be evaluated. right ventricular systolic pressure is moderately elevated at 54.0 mmHg. 14. The inferior vena cava is dilated in size with <50% respiratory variability, suggesting right atrial pressure of 15 mmHg. Assessment & Plan: Coffee-ground emesis-resolved-now on regular diet-continue Protonix 40 twice daily monitor trends prior to admission was on aspirin 81 which is been held-resumed prior to d/c  A. fib RVR [place] > 4  Cardizem not continued on admission continue amiodarone 200 daily, metoprolol  75 twice daily [home]  Currently 25 bid On Eliquis now 5 twice daily Stable in NSR --d/c tele  Chronic respiratory failure and COPD-need desat screen OSA on CPAP-continue CPAP/trilogy when discharge-stable Has used oxygen at home for several years  DM TY 2-CBGs ranging from 100 range ange-continue sliding scale coverage with 5 units of Lantus-monitor  trends  HFpEF-Home meds as above, holding torsemide 50 from prior to admission  Class III obesity-outpatient weight restrictive surgery possible  Urinary incontinence-continue Flomax 0.4 at bedtime  UTI 2/2 Enterobacter--finished Rx  Gout-continue allopurinol  scd until procedure, Full code, Inpatient, no family  Likely will need SNF--likely can d/c to SNF when bed avail  Subjective:  No new issues   Consultants:   Cardiology, nephrology, GI Procedures:   As above Antimicrobials:   None   Objective: Vitals:   08/08/19 2100 08/09/19 0450 08/09/19 0829 08/09/19 0841  BP:  (!) 133/51  (!) 118/48  Pulse: 72 71  66  Resp:  16 16 16   Temp:  98 F (36.7 C)  98.1 F (36.7 C)  TempSrc:  Oral  Oral  SpO2: 94% 92%  94%  Weight: 80.7 kg     Height:        Intake/Output Summary (Last 24 hours) at 08/09/2019 1606 Last data filed at 08/09/2019 1557 Gross per 24 hour  Intake 670 ml  Output 250 ml  Net 420 ml   Filed Weights   08/07/19 1133 08/07/19 2054 08/08/19 2100  Weight: 79.6 kg 80.3 kg 80.7 kg    Examination:  No changes  Awake coherent in nad no focal deficit Sitting in bed cta b no added sound no rales no rhonchi Neuro intact not swollen  Scheduled Meds: . allopurinol  100 mg Oral Daily  . amiodarone  200 mg Oral Daily  . apixaban  5 mg Oral BID  . atorvastatin  40 mg Oral QHS  . Chlorhexidine Gluconate Cloth  6 each Topical Daily  . darbepoetin (ARANESP) injection - NON-DIALYSIS  150 mcg Subcutaneous Q Thu-1800  .  docusate sodium  100 mg Oral Daily  . feeding supplement (ENSURE ENLIVE)  237 mL Oral BID WC  . feeding supplement (PRO-STAT SUGAR FREE 64)  30 mL Oral BID  . umeclidinium bromide  1 puff Inhalation Daily   And  . fluticasone furoate-vilanterol  1 puff Inhalation Daily  . insulin aspart  0-9 Units Subcutaneous TID WC  . insulin glargine  5 Units Subcutaneous QHS  . kidney failure book   Does not apply Once  . mouth rinse  15 mL Mouth  Rinse BID  . metoprolol tartrate  25 mg Oral BID  . multivitamin  1 tablet Oral QHS  . pantoprazole  40 mg Oral BID AC  . sertraline  50 mg Oral Daily   Continuous Infusions: . sodium chloride Stopped (07/29/19 1306)  . sodium chloride 10 mL/hr at 08/05/19 0850     LOS: 22 days   Time spent: Lambert, MD [facility]

## 2019-08-09 NOTE — Care Management Important Message (Signed)
Important Message  Patient Details  Name: [name] MRN: [id number] Date of Birth: [birth year]   Medicare Important Message Given:        Orbie Pyo 08/09/2019, 4:03 PM

## 2019-08-09 NOTE — Progress Notes (Signed)
  [facility] Progress Note    Assessment/ Plan:   1. 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  Cannot be assigned to outpt unit until SNF bed location known.  2. UGIB, EGD with esophagitis, schatzki ring, hiatal hernia 3. AFib hx/o RVR, back on apixaban 4. OSA on CPAP 5. DM2 6. Anemia on ESA; already rec Fereheme 7. Obesity 8. COPD 9. CKD BMD: P at goal 10. Dispo: pending--> awaiting SNF bed and therefore HD unit (likely will discharge to [place] unit)  Subjective:      No complaints, awaiting SNF bed.  Meeting with RD when I visited.    Objective:   BP (!) 118/48 (BP Location: Right Arm)   Pulse 66   Temp 98.1 F (36.7 C) (Oral)   Resp 16   Ht 4\' 11"  (1.499 m)   Wt 80.7 kg   SpO2 94%   BMI 35.93 kg/m   Intake/Output Summary (Last 24 hours) at 08/09/2019 1041 Last data filed at 08/09/2019 0600 Gross per 24 hour  Intake 730 ml  Output 0 ml  Net 730 ml   Weight change: -2.3 kg  Physical Exam: Gen: NAD CVS: RRR Resp: clear Abd: obese Ext: trace LE edema.   ACCESS: TDC and LUE AVF + T/B, healing  Imaging: No results found.  Labs: BMET Recent Labs  Lab 08/03/19 0500 08/04/19 0440 08/05/19 0502 08/06/19 0735 08/07/19 0444 08/08/19 0349 08/09/19 0324  NA 138 135 137 139 139 137 136  K 4.2 3.6 4.1 4.1 4.1 3.9 4.1  CL 104 99 99 103 101 100 100  CO2 26 30 29 31 29 30 28   GLUCOSE 94 96 97 112* 94 106* 122*  BUN 32* 14 30* 12 24* 17 33*  CREATININE 3.29* 2.01* 3.49* 1.93* 3.42* 2.43* 3.91*  CALCIUM 7.9* 7.7* 7.9* 7.8* 8.1* 8.2* 8.3*  PHOS 3.9 3.1 4.1 2.7 3.4  3.4 2.8  2.8 3.6  3.5   CBC Recent Labs  Lab 08/03/19 0500 08/05/19 0502 08/07/19 0742  WBC 11.9* 6.2 6.2  HGB 8.5* 8.4* 8.7*  HCT 27.0* 26.7* 28.2*  MCV 91.2 92.7 93.1  PLT 222 260 272    Medications:    . allopurinol  100 mg Oral Daily  . amiodarone  200 mg Oral Daily  . apixaban  5  mg Oral BID  . atorvastatin  40 mg Oral QHS  . Chlorhexidine Gluconate Cloth  6 each Topical Daily  . darbepoetin (ARANESP) injection - NON-DIALYSIS  150 mcg Subcutaneous Q Thu-1800  . docusate sodium  100 mg Oral Daily  . feeding supplement (ENSURE ENLIVE)  237 mL Oral BID WC  . feeding supplement (PRO-STAT SUGAR FREE 64)  30 mL Oral BID  . umeclidinium bromide  1 puff Inhalation Daily   And  . fluticasone furoate-vilanterol  1 puff Inhalation Daily  . insulin aspart  0-9 Units Subcutaneous TID WC  . insulin glargine  5 Units Subcutaneous QHS  . kidney failure book   Does not apply Once  . mouth rinse  15 mL Mouth Rinse BID  . metoprolol tartrate  25 mg Oral BID  . multivitamin  1 tablet Oral QHS  . pantoprazole  40 mg Oral BID AC  . sertraline  50 mg Oral Daily      Madelon Lips, MD 08/09/2019, 10:41 AM

## 2019-08-09 NOTE — Progress Notes (Signed)
Patient has Trilogy home unit and is able to place herself on/off without assistance. RT instructed pt to have RT called if assistance is needed. RT will monitor as needed.

## 2019-08-09 NOTE — TOC Progression Note (Signed)
Transition of Care [facility]) - Progression Note    Patient Details  Name: [name] MRN: [id number] Date of Birth: 1945-09-19  Transition of Care [facility]) CM/SW Contact  Sharlet Salina Mila Homer, LCSW Phone Number: 08/09/2019, 5:02 PM  Clinical Narrative:  Patient does not want to return to [facility] and requested [facility] at [facility]. Talked with admissions director Lynnae Sandhoff regarding patient and clinicals resent (did not receive them when sent initially). CSW later advised by Larene Beach that she has been unable to talk with anyone with [facility] to determine if they are in network. Per Larene Beach, she has talked with son and updated him and he is also trying to help figure this out. Talked with son by phone regarding his mother and her discharge plan.  The insurance issue with [place] was discussed and son advised that the SNF's are the appropriate venue to determine if they are in network with a certain insurance. Mr. Rion informed CSW that he will advise [facility] as soon as he possibly can regarding his mother.  Visited with patient (4:40 pm) regarding [place]. Patient expressed understanding regarding [place] and her secondary insurance and was understanding in CSW's call with her son before coming to see her. Patient again stated that she is fine with her sons being contacted. When asked about what direction she wanted to go regarding her discharge, patient requested for [facility] to be contacted and firmly responded that she does not want to return to [facility]. Call made to [facility] (4:56 pm) and message left for [employer]. CSW will continue to work on placement for patient       Expected Discharge Plan: [place] Barriers to Discharge: Continued Medical Work up  Expected Discharge Plan and Services Expected Discharge Plan: [place]         Expected Discharge Date: 08/09/19                                    Social Determinants of Health (SDOH) Interventions    Readmission Risk Interventions Readmission Risk Prevention Plan 07/19/2019 07/08/2019 07/07/2019  Transportation Screening Complete Complete Complete  PCP or Specialist Appt within 3-5 Days - - -  Not Complete comments - - -  HRI or [place] or Home Care Consult comments - - -  Social Work Scientific laboratory technician for [place] Planning/Counseling - - -  Palliative Care Screening - - -  Medication Review Press photographer) Complete Complete Complete  PCP or Specialist appointment within 3-5 days of discharge Complete Not Complete -  PCP/Specialist Appt Not Complete comments - Patient will be seen by medical director at [place] or Home Care Consult Complete Complete -  SW Recovery Care/Counseling Consult Complete Complete Complete  Palliative Care Screening Not Applicable Not Applicable Not Applicable  Skilled Nursing Facility Complete Complete Complete  Some recent data might be hidden

## 2019-08-10 LAB — RENAL FUNCTION PANEL
Albumin: 1.8 g/dL — ABNORMAL LOW (ref 3.5–5.0)
Anion gap: 11 (ref 5–15)
BUN: 43 mg/dL — ABNORMAL HIGH (ref 8–23)
CO2: 26 mmol/L (ref 22–32)
Calcium: 8.2 mg/dL — ABNORMAL LOW (ref 8.9–10.3)
Chloride: 100 mmol/L (ref 98–111)
Creatinine, Ser: 4.56 mg/dL — ABNORMAL HIGH (ref 0.44–1.00)
GFR calc Af Amer: 10 mL/min — ABNORMAL LOW (ref >60)
GFR calc non Af Amer: 9 mL/min — ABNORMAL LOW (ref >60)
Glucose, Bld: 101 mg/dL — ABNORMAL HIGH (ref 70–99)
Phosphorus: 3.9 mg/dL (ref 2.5–4.6)
Potassium: 3.8 mmol/L (ref 3.5–5.1)
Sodium: 137 mmol/L (ref 135–145)

## 2019-08-10 LAB — GLUCOSE, CAPILLARY
Glucose-Capillary: 116 mg/dL — ABNORMAL HIGH (ref 70–99)
Glucose-Capillary: 86 mg/dL (ref 70–99)
Glucose-Capillary: 128 mg/dL — ABNORMAL HIGH (ref 70–99)
Glucose-Capillary: 116 mg/dL — ABNORMAL HIGH (ref 70–99)

## 2019-08-10 LAB — MAGNESIUM: Magnesium: 1.9 mg/dL (ref 1.7–2.4)

## 2019-08-10 MED ORDER — HEPARIN SODIUM (PORCINE) 1000 UNIT/ML IJ SOLN
INTRAMUSCULAR | Status: AC
  Filled 2019-08-10: qty 4

## 2019-08-10 MED ORDER — DARBEPOETIN ALFA 150 MCG/0.3ML IJ SOSY: 150.0000 ug | PREFILLED_SYRINGE | INTRAMUSCULAR | Status: DC

## 2019-08-10 NOTE — Progress Notes (Signed)
Renal Navigator continues to follow for disposition plan and will complete OP HD referral once plan has been made.   Alphonzo Cruise, [place] Renal Navigator [phone]

## 2019-08-10 NOTE — Progress Notes (Signed)
Physical Therapy Treatment Patient Details Name: [name] MRN: [id number] DOB: June 05, 1946 Today's Date: 08/10/2019    History of Present Illness 74 yo F with a history of diastolic heart failure, CKD stage IIIb, COPD, chronic respiratory failure, obesity, OSA, Afib (on chronic anticoagulation), GERD, hiatal hernia, gout, DM2, and chronic anemia who presented to [facility] on 12/12 after an episode of coffee ground emesis, nausea and vomiting. She was also started on ciprofloaxinc for a UTI. developed afib with RVR, started on amiodarone, and has had progressive renal failure, and has been anuric the last 24 hours. Pt off CRRT on 12/24.    PT Comments    Pt was seen for mobility and did there ex as she is tired from HD. Pt has not been walking much prior to her admission to hosp, but is controlling standing more with transfers, with direct assist from PT.  Pt performed there ex to LE's with good tolerance, but did discuss her erythema and edema from chronic skin changes.  Pt is not wearing stockings to manage this, and talked with her about fitting them for her current condition rather than when the edema is better managed.  Follow Up Recommendations  SNF     Equipment Recommendations  None recommended by PT    Recommendations for Other Services       Precautions / Restrictions Precautions Precautions: Fall Precaution Comments: pt is smaller stature, monitor at side of bed to scoot back enough Restrictions Weight Bearing Restrictions: No    Mobility  Bed Mobility Overal bed mobility: Needs Assistance             General bed mobility comments: pt is at side of bed when PT arrived  Transfers                    Ambulation/Gait                 Stairs             Wheelchair Mobility    Modified Rankin (Stroke Patients Only)       Balance   Sitting-balance support: Bilateral upper extremity supported;Feet supported Sitting balance-Leahy  Scale: Good Sitting balance - Comments: supervision                                    Cognition Arousal/Alertness: Awake/alert Behavior During Therapy: WFL for tasks assessed/performed Overall Cognitive Status: Within Functional Limits for tasks assessed                                 General Comments: pt is willing to work and fairly calm about doing ex's      Exercises General Exercises - Lower Extremity Ankle Circles/Pumps: AROM;5 reps Long Arc Quad: AROM;10 reps Heel Slides: Strengthening;10 reps Hip ABduction/ADduction: Strengthening;10 reps Hip Flexion/Marching: AROM;10 reps    General Comments General comments (skin integrity, edema, etc.): Pt was up to side of bed after HD today, and trying to eat a little and rest.  Talked wiht her about LE edema and redness, has been given stockings to wear in the past that don't fit once the legs are like now.  Talked with her about refitting stockings for current issues      Pertinent Vitals/Pain Pain Assessment: No/denies pain    Home Living  Prior Function            PT Goals (current goals can now be found in the care plan section) Acute Rehab PT Goals Patient Stated Goal: get stronger and walk  Progress towards PT goals: Progressing toward goals    Frequency    Min 3X/week      PT Plan Current plan remains appropriate    Co-evaluation              AM-PAC PT "6 Clicks" Mobility   Outcome Measure  Help needed turning from your back to your side while in a flat bed without using bedrails?: None Help needed moving from lying on your back to sitting on the side of a flat bed without using bedrails?: None Help needed moving to and from a bed to a chair (including a wheelchair)?: A Little Help needed standing up from a chair using your arms (e.g., wheelchair or bedside chair)?: A Little Help needed to walk in hospital room?: A Little Help needed  climbing 3-5 steps with a railing? : Total 6 Click Score: 18    End of Session   Activity Tolerance: Patient tolerated treatment well Patient left: in bed;with call bell/phone within reach;with bed alarm set Nurse Communication: Mobility status PT Visit Diagnosis: Muscle weakness (generalized) (M62.81)     Time: 3903-0092 PT Time Calculation (min) (ACUTE ONLY): 12 min  Charges:  $Therapeutic Exercise: 8-22 mins   Ramond Dial 08/10/2019, 3:53 PM   Mee Hives, PT MS Acute Rehab Dept. Number: [place] and [place]

## 2019-08-10 NOTE — Progress Notes (Signed)
Renal Navigator was informed by CSW/V. Crawford that patient has been accepted at [facility] for SNF rehab and Admissions Coordinator has requested [facility] in [place] for OP HD treatment. This will work well, since it is patient's plan to live at son's home in [place] at discharge from SNF.  OP HD referral submitted to [place]. Navigator called [facility] unit to let clinic manager know that referral was coming and see if Navigator could get seat schedule that SNF has requested (MWF 11/11:30am), however, Freight forwarder and assistant are both out today. Navigator will follow up tomorrow.   Alphonzo Cruise, [place] Renal Navigator [phone]

## 2019-08-10 NOTE — TOC Progression Note (Signed)
Transition of Care [facility]) - Progression Note    Patient Details  Name: [name] MRN: [id number] Date of Birth: 17-Nov-1945  Transition of Care [facility]) CM/SW Contact  Sharlet Salina Mila Homer, LCSW Phone Number: 08/10/2019, 5:09 PM  Clinical Narrative: CSW advised that patient's sons agreeable to patient discharging to [facility], even though facility has not been able to confirm whether or not they are in network with patient's secondary insurance - [facility]. Dialysis coordinator Terri Piedra has initiated contact with [facility] to set-up patient's HD Center in [place]. Per admissions director Larene Beach, MWF at 11:00 - 11:30 am is the preferred HD schedule.   Contact was made with Larene Beach, admissions director at [facility], and she has bed availability for patient and is aware that Ms. Densmore will d/c to their facility once HD set-up.         Expected Discharge Plan: Skilled Nursing Facility Barriers to Discharge: Continued Medical Work up  Expected Discharge Plan and Services Expected Discharge Plan: [place]         Expected Discharge Date: 08/09/19                                   Social Determinants of Health (SDOH) Interventions  No SDOH interventions needed or requested at this time.   Readmission Risk Interventions Readmission Risk Prevention Plan 07/19/2019 07/08/2019 07/07/2019  Transportation Screening Complete Complete Complete  PCP or Specialist Appt within 3-5 Days - - -  Not Complete comments - - -  HRI or [place] or Home Care Consult comments - - -  Social Work Consult for [place] Planning/Counseling - - -  [place] - - -  Medication Review Press photographer) Complete Complete Complete  PCP or Specialist appointment within 3-5 days of discharge Complete Not Complete -  PCP/Specialist Appt Not Complete comments - Patient will be seen by medical director at [place] or Home Care  Consult Complete Complete -  SW Recovery Care/Counseling Consult Complete Complete Complete  Palliative Care Screening Not Applicable Not Applicable Not Applicable  Skilled Nursing Facility Complete Complete Complete  Some recent data might be hidden

## 2019-08-10 NOTE — Progress Notes (Signed)
Patient has Trilogy home unit BIPAP and places herself on/off without assistance. RT will monitor as needed.

## 2019-08-10 NOTE — Progress Notes (Signed)
Patient seen and examined and discharge summary set up for 1/6 and attendant probable placement at skilled facility with dialysis   Verneita Griffes, MD [facility] 2:10 PM

## 2019-08-10 NOTE — Discharge Summary (Signed)
Physician Discharge Summary  [name] OHY:[id number] DOB: 03/07/1946 DOA: 07/17/2019  PCP: [name], MD  Admit date: 07/17/2019 Discharge date: 08/10/2019  Time spent: 35 minutes  Recommendations for Outpatient Follow-up:  1. Needs renal panel CBC and iron studies as per outpatient protocol 2. 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 fib >Chad score 4 on anticoagulation HTN, some urinary retention, IBS Initially admitted by PCCM after having an episode of coffee-ground emesis nausea vomiting had active bleeding ulcers she was transferred for consideration of CRRT She was seen by nephrology and has gradually stabilized has been deemed ESRD She had a left brachiocephalic access placed which is immature and she will get dialysis through her chest port  Hospital Course:   Coffee-ground emesis-resolved-now on regular diet-continue Protonix 40 twice daily monitor trends prior to admission was on aspirin 81 which is been held-resumed prior to d/c  A. fib RVR [place] > 4  Cardizem not continued on admission continue amiodarone 200 daily, metoprolol  75 twice daily [home]  Currently 75 bid On Eliquis now 5 twice daily Stable in NSR for the past several days  Chronic respiratory failure and COPD-need desat screen OSA on CPAP-continue CPAP/trilogy when discharge-stable Has used oxygen at home for several years  DM TY 2-CBGs ranging from 86-1 20-continue sliding scale coverage with 5 units of Lantus-monitor trends  HFpEF-Home meds as above, holding torsemide 50 from prior to admission  Class III obesity-outpatient weight restrictive surgery possible  Urinary incontinence-continue Flomax 0.4 at bedtime  UTI 2/2 Enterobacter--finished Rx  Gout-continue allopurinol  Procedures:  Echocardiogram EF 50-55%  Consultations:  GI  Assumed care from critical care earlier this admission  Discharge Exam: Vitals:   08/10/19 1118 08/10/19 1205  BP: (!) 126/51 (!) 99/39  Pulse: 70 65  Resp: 18 18  Temp: 98.3 F (36.8 C) 98.1 F (36.7 C)  SpO2: 94% 92%    General: *EOMI NCAT thick neck Pleasant no new issues Cardiovascular: S1-S2 no murmur rub or gallop  Respiratory: clinically clear no added sound no rales no rhonchi lower extremities soft nontender she has area on the front of her Left shin that is a little denuded  Discharge Instructions   Discharge Instructions    Diet - low sodium heart healthy   Complete by: As directed    Increase activity  slowly   Complete by: As directed      Allergies as of 08/10/2019   No Known Allergies     Medication List    STOP taking these medications   diltiazem 120 MG 24 hr capsule Commonly known as: CARDIZEM CD   fluconazole 50 MG tablet Commonly known as: DIFLUCAN   loperamide 2 MG  capsule Commonly known as: IMODIUM   metoprolol succinate 25 MG 24 hr tablet Commonly known as: TOPROL-XL   metoprolol succinate 50 MG 24 hr tablet Commonly known as: TOPROL-XL   mirtazapine 15 MG tablet Commonly known as: REMERON   NONFORMULARY OR COMPOUNDED ITEM   OXYGEN   torsemide 10 MG tablet Commonly known as: DEMADEX   zinc sulfate 220 (50 Zn) MG capsule     TAKE these medications   albuterol (2.5 MG/3ML) 0.083% nebulizer solution Commonly known as: PROVENTIL Take 2.5 mg by nebulization every 6 (six) hours as needed for wheezing or shortness of breath.   Ventolin HFA 108 (90 Base) MCG/ACT inhaler Generic drug: albuterol Inhale 2 puffs into the lungs every 6 (six) hours as needed for wheezing or shortness of breath.   allopurinol 100 MG tablet Commonly known as: ZYLOPRIM Take 1 tablet (100 mg total) by mouth 2 (two) times daily.   amiodarone 200 MG tablet Commonly known as: PACERONE Take 1 tablet (200 mg total) by mouth daily.   apixaban 5 MG Tabs tablet Commonly known as: ELIQUIS Take 1 tablet (5 mg total) by mouth 2 (two) times daily.   aspirin EC 81 MG tablet Take 81 mg by mouth daily.   atorvastatin 40 MG tablet Commonly known as: LIPITOR Take 1 tablet (40 mg total) by mouth at bedtime.   Biotin 5 MG Caps Take 5 mg by mouth daily.   esomeprazole 40 MG capsule Commonly known as: NEXIUM TAKE 1 CAPSULE TWICE A DAY 30 MINUTES PRIOR TO MEALS   famotidine 40 MG tablet Commonly known as: PEPCID TAKE 1 TABLET AT BEDTIME AS NEEDED TO CONTROL REFLUX   feeding supplement (GLUCERNA SHAKE) Liqd Take 237 mLs by mouth 2 (two) times daily between meals.   hydrALAZINE 25 MG tablet Commonly known as: APRESOLINE Take 25 mg by mouth 2 (two) times daily.   insulin aspart 100 UNIT/ML injection Commonly known as: novoLOG Inject 0-9 Units into the skin 3 (three) times daily with meals.   Lantus SoloStar 100 UNIT/ML Solostar Pen Generic drug: Insulin  Glargine Inject 8 Units into the skin at bedtime.   metoprolol tartrate 25 MG tablet Commonly known as: LOPRESSOR Take 1 tablet (25 mg total) by mouth 2 (two) times daily.   potassium chloride 10 MEQ tablet Commonly known as: KLOR-CON Take 1 tablet (10 mEq total) by mouth every other day.   sertraline 50 MG tablet Commonly known as: ZOLOFT Take 1 tablet (50 mg total) by mouth daily.   tamsulosin 0.4 MG Caps capsule Commonly known as: FLOMAX Take 1 capsule (0.4 mg total) by mouth daily.   Trelegy Ellipta 100-62.5-25 MCG/INH Aepb Generic drug: Fluticasone-Umeclidin-Vilant Inhale 1 puff into the lungs daily.   vitamin C 500 MG tablet Commonly known as: ASCORBIC ACID Take 1 tablet (500 mg total) by mouth daily for 13 days.      No Known Allergies    The results of significant diagnostics from this hospitalization (including imaging, microbiology, ancillary and laboratory) are listed below for reference.    Significant Diagnostic Studies: CT ABDOMEN PELVIS WO CONTRAST  Result Date: 07/17/2019 CLINICAL  DATA:  Abdominal pain, nausea and vomiting EXAM: CT ABDOMEN AND PELVIS WITHOUT CONTRAST TECHNIQUE: Multidetector CT imaging of the abdomen and pelvis was performed following the standard protocol without IV contrast. COMPARISON:  None. FINDINGS: Lower chest: Increased bibasilar bandlike opacities consistent with atelectasis. Right middle lobe indeterminate small nodules noted, series 4, images 3 and 7. These measure 5 mm. Stable cardiomegaly. No pericardial or pleural effusion. Large hiatal hernia again noted with the majority of the stomach in the chest. Hepatobiliary: Limited without IV contrast. Remote cholecystectomy. No intrahepatic biliary dilatation or large focal hepatic abnormality. Pancreas: Unremarkable. No pancreatic ductal dilatation or surrounding inflammatory changes. Spleen: Normal in size without focal abnormality. Adrenals/Urinary Tract: Normal adrenal glands. Kidneys  demonstrate mild chronic perinephric strandy edema. Renal obstruction or hydronephrosis. No hydroureter or obstructing ureteral calculus adjacent chronic calcified gonadal vein phleboliths noted on the left. Urinary bladder unremarkable. Stomach/Bowel: Large hiatal hernia again noted. Majority of stomach in the chest. Negative for obstruction, significant dilatation, ileus, or free air. Extensive colonic diverticulosis without acute focal inflammatory process or wall thickening. No fluid collection, hemorrhage, hematoma, abscess or ascites. Vascular/Lymphatic: Abdominal atherosclerosis noted. Negative for aneurysm. No retroperitoneal hemorrhage or hematoma. No bulky adenopathy. Reproductive: Status post hysterectomy. No adnexal masses. Other: No abdominal wall hernia or abnormality. No abdominopelvic ascites. Musculoskeletal: Remote lumbar spinal fusion. No acute osseous finding. Improved body anasarca compared to the prior study. IMPRESSION: Stable large hiatal hernia with majority of the stomach in the chest. Increased bibasilar atelectasis Right middle lobe 5 mm pulmonary nodules remain indeterminate. No follow-up needed if patient is low-risk. Non-contrast chest CT can be considered in 12 months if patient is high-risk. This recommendation follows the consensus statement: Guidelines for Management of Incidental Pulmonary Nodules Detected on CT Images: From the Fleischner Society 2017; Radiology 2017; 284:228-243. Diverticulosis without acute inflammatory process Improved body anasarca Electronically Signed   By: Jerilynn Mages.  Shick M.D.   On: 07/17/2019 13:06   IR Fluoro Guide CV Line Left  Result Date: 07/29/2019 CLINICAL DATA:  Renal failure, needs durable venous access for hemodialysis. Existing right port catheter EXAM: TUNNELED HEMODIALYSIS CATHETER PLACEMENT WITH ULTRASOUND AND FLUOROSCOPIC GUIDANCE TECHNIQUE: The procedure, risks, benefits, and alternatives were explained to the patient. Questions regarding  the procedure were encouraged and answered. The patient understands and consents to the procedure. As antibiotic prophylaxis, cefazolin 2 g was ordered pre-procedure and administered intravenously within one hour of incision. Patency of the left IJ vein was confirmed with ultrasound with image documentation. An appropriate skin site was determined. Region was prepped using maximum barrier technique including cap and mask, sterile gown, sterile gloves, large sterile sheet, and Chlorhexidine as cutaneous antisepsis. The region was infiltrated locally with 1% lidocaine. Intravenous Fentanyl 71mcg and Versed 1mg  were administered as conscious sedation during continuous monitoring of the patient's level of consciousness and physiological / cardiorespiratory status by the radiology RN, with a total moderate sedation time of 13 minutes. Under real-time ultrasound guidance, the left IJ vein was accessed with a 21 gauge micropuncture needle; the needle tip within the vein was confirmed with ultrasound image documentation. Needle exchanged over the 018 guidewire for transitional dilator, which allowed advancement of a Benson wire into the IVC. Over this, an MPA catheter was advanced. A Palindrome 23 hemodialysis catheter was tunneled from the left anterior chest wall approach to the IJ dermatotomy site. The MPA catheter was exchanged over an Amplatz wire for serial vascular dilators which allow placement of a peel-away sheath, through which the  catheter was advanced under intermittent fluoroscopy, positioned with its tips in the proximal and midright atrium. Spot chest radiograph confirms good catheter position. No pneumothorax. Catheter was flushed and primed per protocol. Catheter secured externally with O Prolene sutures. The left IJ dermatotomy site was closed with Dermabond. COMPLICATIONS: COMPLICATIONS None immediate FLUOROSCOPY TIME:  1.1 minutes; 149  uGym2 DAP IMPRESSION: 1. Technically successful placement of  tunneled left IJ hemodialysis catheter with ultrasound and fluoroscopic guidance. Ready for routine use. ACCESS: Remains approachable for percutaneous intervention as needed. Electronically Signed   By: Lucrezia Europe M.D.   On: 07/29/2019 15:37   IR US Guide Vasc Access Left  Result Date: 07/29/2019 CLINICAL DATA:  Renal failure, needs durable venous access for hemodialysis. Existing right port catheter EXAM: TUNNELED HEMODIALYSIS CATHETER PLACEMENT WITH ULTRASOUND AND FLUOROSCOPIC GUIDANCE TECHNIQUE: The procedure, risks, benefits, and alternatives were explained to the patient. Questions regarding the procedure were encouraged and answered. The patient understands and consents to the procedure. As antibiotic prophylaxis, cefazolin 2 g was ordered pre-procedure and administered intravenously within one hour of incision. Patency of the left IJ vein was confirmed with ultrasound with image documentation. An appropriate skin site was determined. Region was prepped using maximum barrier technique including cap and mask, sterile gown, sterile gloves, large sterile sheet, and Chlorhexidine as cutaneous antisepsis. The region was infiltrated locally with 1% lidocaine. Intravenous Fentanyl 64mcg and Versed 1mg  were administered as conscious sedation during continuous monitoring of the patient's level of consciousness and physiological / cardiorespiratory status by the radiology RN, with a total moderate sedation time of 13 minutes. Under real-time ultrasound guidance, the left IJ vein was accessed with a 21 gauge micropuncture needle; the needle tip within the vein was confirmed with ultrasound image documentation. Needle exchanged over the 018 guidewire for transitional dilator, which allowed advancement of a Benson wire into the IVC. Over this, an MPA catheter was advanced. A Palindrome 23 hemodialysis catheter was tunneled from the left anterior chest wall approach to the IJ dermatotomy site. The MPA catheter was  exchanged over an Amplatz wire for serial vascular dilators which allow placement of a peel-away sheath, through which the catheter was advanced under intermittent fluoroscopy, positioned with its tips in the proximal and midright atrium. Spot chest radiograph confirms good catheter position. No pneumothorax. Catheter was flushed and primed per protocol. Catheter secured externally with O Prolene sutures. The left IJ dermatotomy site was closed with Dermabond. COMPLICATIONS: COMPLICATIONS None immediate FLUOROSCOPY TIME:  1.1 minutes; 149  uGym2 DAP IMPRESSION: 1. Technically successful placement of tunneled left IJ hemodialysis catheter with ultrasound and fluoroscopic guidance. Ready for routine use. ACCESS: Remains approachable for percutaneous intervention as needed. Electronically Signed   By: Lucrezia Europe M.D.   On: 07/29/2019 15:37   DG CHEST PORT 1 VIEW  Result Date: 07/25/2019 CLINICAL DATA:  Central line placement. EXAM: PORTABLE CHEST 1 VIEW COMPARISON:  Chest x-ray dated July 17, 2019. FINDINGS: The patient is rotated to the right. Unchanged right chest wall port catheter with tip in the SVC. Grossly stable cardiomediastinal silhouette accounting for rotation. No focal consolidation, pleural effusion, or pneumothorax. No acute osseous abnormality. IMPRESSION: 1. Unchanged right chest wall port catheter. No new central line identified. 2. No active disease. Electronically Signed   By: Titus Dubin M.D.   On: 07/25/2019 13:08   DG Chest Portable 1 View  Result Date: 07/17/2019 CLINICAL DATA:  Vomiting, abdominal pain. EXAM: PORTABLE CHEST 1 VIEW COMPARISON:  July 05, 2019. FINDINGS:  Stable cardiomediastinal silhouette. Right internal jugular Port-A-Cath is unchanged. No pneumothorax or pleural effusion is noted. No acute pulmonary disease is noted. Bony thorax is unremarkable. Stable hiatal hernia. IMPRESSION: No acute cardiopulmonary abnormality seen.  Stable hiatal hernia.  Electronically Signed   By: Marijo Conception M.D.   On: 07/17/2019 10:53   DG Abd Portable 1 View  Result Date: 07/17/2019 CLINICAL DATA:  Weakness, vomiting. EXAM: PORTABLE ABDOMEN - 1 VIEW COMPARISON:  September 03, 2010. FINDINGS: The bowel gas pattern is normal. No radio-opaque calculi or other significant radiographic abnormality are seen. IMPRESSION: Negative. Electronically Signed   By: Marijo Conception M.D.   On: 07/17/2019 10:53   VAS [place] UPPER EXT VEIN MAPPING (PRE-OP AVF)  Result Date: 08/03/2019 UPPER EXTREMITY VEIN MAPPING  Indications: Pre-access. Limitations: Body habitus Comparison Study: No prior study Performing Technologist: Sharion Dove RVS  Examination Guidelines: A complete evaluation includes B-mode imaging, spectral Doppler, color Doppler, and power Doppler as needed of all accessible portions of each vessel. Bilateral testing is considered an integral part of a complete examination. Limited examinations for reoccurring indications may be performed as noted. +-----------------+-------------+----------+------------------------+ Right Cephalic   Diameter (cm)Depth (cm)        Findings         +-----------------+-------------+----------+------------------------+ Mid upper arm        0.32        0.56                            +-----------------+-------------+----------+------------------------+ Dist upper arm       0.35        1.14          branching         +-----------------+-------------+----------+------------------------+ Antecubital fossa    0.26        0.79   thrombosed and branching +-----------------+-------------+----------+------------------------+ Prox forearm         0.28        0.89                            +-----------------+-------------+----------+------------------------+ Mid forearm          0.28        0.69          branching         +-----------------+-------------+----------+------------------------+ Wrist                0.30         0.54                            +-----------------+-------------+----------+------------------------+ +-----------------+-------------+----------+---------+ Right Basilic    Diameter (cm)Depth (cm)Findings  +-----------------+-------------+----------+---------+ Antecubital fossa    0.32        0.52             +-----------------+-------------+----------+---------+ Prox forearm         0.36        1.11   branching +-----------------+-------------+----------+---------+ Mid forearm          0.24        0.71   branching +-----------------+-------------+----------+---------+ Wrist                0.19        0.41             +-----------------+-------------+----------+---------+ +-----------------+-------------+----------+---------+ Left Cephalic    Diameter (cm)Depth (cm)Findings  +-----------------+-------------+----------+---------+ Mid  upper arm        0.19        0.31             +-----------------+-------------+----------+---------+ Dist upper arm       0.15        0.22             +-----------------+-------------+----------+---------+ Antecubital fossa    0.17        0.16             +-----------------+-------------+----------+---------+ Prox forearm         0.30        0.65   branching +-----------------+-------------+----------+---------+ Mid forearm          0.19        0.86   branching +-----------------+-------------+----------+---------+ Wrist                0.22        0.69             +-----------------+-------------+----------+---------+ +-----------------+-------------+----------+---------+ Left Basilic     Diameter (cm)Depth (cm)Findings  +-----------------+-------------+----------+---------+ Mid upper arm        0.75        2.87             +-----------------+-------------+----------+---------+ Dist upper arm       0.39        2.18             +-----------------+-------------+----------+---------+ Antecubital fossa    0.39         0.74             +-----------------+-------------+----------+---------+ Prox forearm         0.32        0.93             +-----------------+-------------+----------+---------+ Mid forearm          0.21        0.65   branching +-----------------+-------------+----------+---------+ Wrist                0.13        0.48             +-----------------+-------------+----------+---------+ *See table(s) above for measurements and observations.  Diagnosing physician: Ruta Hinds MD Electronically signed by Ruta Hinds MD on 08/03/2019 at 1:47:51 PM.    Final    ECHOCARDIOGRAM LIMITED  Result Date: 07/27/2019   ECHOCARDIOGRAM LIMITED REPORT   Patient Name:   [name] Date of Exam: 07/27/2019 Medical Rec #:  [id number]          Height:       56.0 in Accession #:    [id number]         Weight:       207.2 lb Date of Birth:  09-Jan-1946          BSA:          1.80 m Patient Age:    11 years           BP:           77/54 mmHg Patient Gender: F                  HR:           108 bpm. Exam Location:  Inpatient  Procedure: Limited Echo, Limited Color Doppler and Cardiac Doppler Indications:    Cardiomyopathy-Unspecified 425.9 / I42.9  History:        Patient has prior history of Echocardiogram examinations, most  recent 06/16/2019. Chronic kidney disease on dialysis.  Sonographer:    Darlina Sicilian RDCS Referring Phys: [id number] [place]  1. Left ventricular ejection fraction, by visual estimation, is 55 to 60%. The left ventricle has normal function. There is mildly increased left ventricular hypertrophy.  2. Left ventricular diastolic function could not be evaluated.  3. The left ventricle has no regional wall motion abnormalities.  4. Global right ventricle has normal systolic function.The right ventricular size is normal. No increase in right ventricular wall thickness.  5. Left atrial size was moderately dilated.  6. Right atrial size was mildly dilated.   7. Mild mitral annular calcification.  8. The mitral valve is degenerative. Trivial mitral valve regurgitation.  9. The tricuspid valve is grossly normal. Tricuspid valve regurgitation moderate. 10. The tricuspid valve was normal in structure. Tricuspid valve regurgitation moderate. 11. The aortic valve is grossly normal. Aortic valve regurgitation is not visualized. 12. Moderately elevated pulmonary artery systolic pressure. 13. The tricuspid regurgitant velocity is 3.12 m/s, and with an assumed right atrial pressure of 15 mmHg, the estimated right ventricular systolic pressure is moderately elevated at 54.0 mmHg. 14. The inferior vena cava is dilated in size with <50% respiratory variability, suggesting right atrial pressure of 15 mmHg. FINDINGS  Left Ventricle: Left ventricular ejection fraction, by visual estimation, is 55 to 60%. The left ventricle has normal function. The left ventricle has no regional wall motion abnormalities. The left ventricular internal cavity size was the left ventricle is normal in size. There is mildly increased left ventricular wall thickness. Concentric left ventricular hypertrophy. The left ventricular diastology could not be evaluated due to atrial fibrillation. Left ventricular diastolic function could not be evaluated. Right Ventricle: The right ventricular size is normal. No increase in right ventricular wall thickness. Global RV systolic function is has normal systolic function. The tricuspid regurgitant velocity is 3.12 m/s, and with an assumed right atrial pressure  of 15 mmHg, the estimated right ventricular systolic pressure is moderately elevated at 54.0 mmHg. Left Atrium: Left atrial size was moderately dilated. Right Atrium: Right atrial size was mildly dilated. Right atrial pressure is estimated at 15 mmHg. Pericardium: There is no evidence of pericardial effusion is seen. There is no evidence of pericardial effusion. Mitral Valve: The mitral valve is normal in  structure. There is mild thickening of the mitral valve leaflet(s). There is mild calcification of the mitral valve leaflet(s). Mild mitral annular calcification. Mild to moderate mitral valve regurgitation, with centrally-directed jet. Tricuspid Valve: The tricuspid valve is normal in structure. Tricuspid valve regurgitation moderate. Venous: The inferior vena cava is dilated in size with less than 50% respiratory variability, suggesting right atrial pressure of 15 mmHg.  LEFT VENTRICLE          Normals PLAX 2D LVIDd:         4.47 cm  3.6 cm LVIDs:         2.80 cm  1.7 cm LV PW:         1.23 cm  1.4 cm LV IVS:        1.18 cm  1.3 cm LVOT diam:     1.70 cm  2.0 cm LV SV:         62 ml    79 ml LV SV Index:   30.95    45 ml/m2 LVOT Area:     2.27 cm 3.14 cm2  LV Volumes (MOD)  Normals LV area d, A2C:    31.40 cm LV area d, A4C:    37.80 cm LV area s, A2C:    20.50 cm LV area s, A4C:    23.70 cm LV major d, A2C:   7.46 cm LV major d, A4C:   8.08 cm LV major s, A2C:   6.74 cm LV major s, A4C:   7.16 cm LV vol d, MOD A2C: 112.0 ml  68 ml LV vol d, MOD A4C: 146.0 ml LV vol s, MOD A2C: 53.5 ml   24 ml LV vol s, MOD A4C: 65.9 ml LV SV MOD A2C:     58.5 ml LV SV MOD A4C:     146.0 ml LV SV MOD BP:      71.6 ml   45 ml LEFT ATRIUM         Index LA diam:    4.60 cm 2.55 cm/m  AORTIC VALVE             Normals LVOT Vmax:   117.00 cm/s LVOT Vmean:  75.200 cm/s 75 cm/s LVOT VTI:    0.189 m     25.3 cm  AORTA                 Normals Ao Root diam: 2.20 cm 31 mm TRICUSPID VALVE             Normals TR Peak grad:   39.0 mmHg TR Vmax:        333.00 cm/s 288 cm/s  SHUNTS Systemic VTI:  0.19 m Systemic Diam: 1.70 cm  Dani Gobble Croitoru MD Electronically signed by Sanda Klein MD Signature Date/Time: 07/27/2019/11:41:34 AMThe mitral valve is normal in structure.    Final     Microbiology: Recent Results (from the past 240 hour(s))  Surgical pcr screen     Status: None   Collection Time: 08/05/19  5:25 AM   Specimen:  Nasal Mucosa; Nasal Swab  Result Value Ref Range Status   MRSA, PCR NEGATIVE NEGATIVE Final   Staphylococcus aureus NEGATIVE NEGATIVE Final    Comment: (NOTE) The Xpert SA Assay (FDA approved for NASAL specimens in patients 4 years of age and older), is one component of a comprehensive surveillance program. It is not intended to diagnose infection nor to guide or monitor treatment. Performed at [facility], [place] [address] [place], [place] [address]   SARS CORONAVIRUS 2 (TAT 6-24 HRS) Nasopharyngeal Nasopharyngeal Swab     Status: None   Collection Time: 08/08/19 10:54 AM   Specimen: Nasopharyngeal Swab  Result Value Ref Range Status   SARS Coronavirus 2 NEGATIVE NEGATIVE Final    Comment: (NOTE) SARS-CoV-2 target nucleic acids are NOT DETECTED. The SARS-CoV-2 RNA is generally detectable in upper and lower respiratory specimens during the acute phase of infection. Negative results do not preclude SARS-CoV-2 infection, do not rule out co-infections with other pathogens, and should not be used as the sole basis for treatment or other patient management decisions. Negative results must be combined with clinical observations, patient history, and epidemiological information. The expected result is Negative. Fact Sheet for Patients: SugarRoll.be Fact Sheet for Healthcare Providers: https://www.woods-mathews.com/ This test is not yet approved or cleared by the [place] FDA and  has been authorized for detection and/or diagnosis of SARS-CoV-2 by FDA under an Emergency Use Authorization (EUA). This EUA will remain  in effect (meaning this test can be used) for the duration of the COVID-19 declaration under Section 56 4(b)(1)  of the Act, 21 U.S.C. section 360bbb-3(b)(1), unless the authorization is terminated or revoked sooner. Performed at [facility], [place] [address] [place], [place] [address]      Labs: Basic Metabolic  Panel: Recent Labs  Lab 08/06/19 0735 08/07/19 0444 08/08/19 0349 08/09/19 0324 08/10/19 0439  NA 139 139 137 136 137  K 4.1 4.1 3.9 4.1 3.8  CL 103 101 100 100 100  CO2 31 29 30 28 26   GLUCOSE 112* 94 106* 122* 101*  BUN 12 24* 17 33* 43*  CREATININE 1.93* 3.42* 2.43* 3.91* 4.56*  CALCIUM 7.8* 8.1* 8.2* 8.3* 8.2*  MG 1.8 1.9 1.8 1.9 1.9  PHOS 2.7 3.4  3.4 2.8  2.8 3.6  3.5 3.9   Liver Function Tests: Recent Labs  Lab 08/06/19 0735 08/07/19 0444 08/08/19 0349 08/09/19 0324 08/10/19 0439  ALBUMIN 1.8* 1.8* 1.9* 1.9* 1.8*   No results for input(s): LIPASE, AMYLASE in the last 168 hours. No results for input(s): AMMONIA in the last 168 hours. CBC: Recent Labs  Lab 08/05/19 0502 08/07/19 0742  WBC 6.2 6.2  HGB 8.4* 8.7*  HCT 26.7* 28.2*  MCV 92.7 93.1  PLT 260 272   Cardiac Enzymes: No results for input(s): CKTOTAL, CKMB, CKMBINDEX, TROPONINI in the last 168 hours. BNP: BNP (last 3 results) Recent Labs    05/31/19 1658 06/15/19 1347 07/05/19 1359  BNP 206.0* 327.0* 321.0*    ProBNP (last 3 results) No results for input(s): PROBNP in the last 8760 hours.  CBG: Recent Labs  Lab 08/09/19 1127 08/09/19 1642 08/09/19 2130 08/10/19 0656 08/10/19 1202  GLUCAP 100* 111* 113* 116* 86       Signed:  Nita Sells MD   [facility] 08/10/2019, 2:05 PM

## 2019-08-10 NOTE — Progress Notes (Signed)
  [facility] Progress Note    Assessment/ Plan:   1. AoCKD, now likely ESRD--> AVF maturing and TDC, dialyzing on TTS schedule, continue current, HD today.  Cannot be assigned to outpt unit until SNF bed location known.  2. UGIB, EGD with esophagitis, schatzki ring, hiatal hernia 3. AFib hx/o RVR, back on apixaban 4. OSA on CPAP 5. DM2 6. Anemia on ESA; already rec Fereheme 7. Obesity 8. COPD 9. CKD BMD: P at goal 10. Dispo: pending--> awaiting SNF bed and therefore HD unit (likely will discharge to [facility] unit)  Subjective:      No complaints, awaiting SNF bed.  Seen during dialysis.  Denies f/c, cramps, dyspnea, edema.   Objective:   BP (!) 93/44   Pulse 72   Temp 98 F (36.7 C) (Oral)   Resp 16   Ht 4\' 11"  (1.499 m)   Wt 80.7 kg   SpO2 93%   BMI 35.93 kg/m   Intake/Output Summary (Last 24 hours) at 08/10/2019 0937 Last data filed at 08/10/2019 0739 Gross per 24 hour  Intake 300 ml  Output 250 ml  Net 50 ml   Weight change:   Physical Exam: Gen: NAD CVS: RRR Resp: clear Abd: obese Ext: trace LE edema.   ACCESS: TDC and LUE AVF + T/B, healing  Imaging: No results found.  Labs: BMET Recent Labs  Lab 08/04/19 0440 08/05/19 0502 08/06/19 0735 08/07/19 0444 08/08/19 0349 08/09/19 0324 08/10/19 0439  NA 135 137 139 139 137 136 137  K 3.6 4.1 4.1 4.1 3.9 4.1 3.8  CL 99 99 103 101 100 100 100  CO2 30 29 31 29 30 28 26   GLUCOSE 96 97 112* 94 106* 122* 101*  BUN 14 30* 12 24* 17 33* 43*  CREATININE 2.01* 3.49* 1.93* 3.42* 2.43* 3.91* 4.56*  CALCIUM 7.7* 7.9* 7.8* 8.1* 8.2* 8.3* 8.2*  PHOS 3.1 4.1 2.7 3.4  3.4 2.8  2.8 3.6  3.5 3.9   CBC Recent Labs  Lab 08/05/19 0502 08/07/19 0742  WBC 6.2 6.2  HGB 8.4* 8.7*  HCT 26.7* 28.2*  MCV 92.7 93.1  PLT 260 272    Medications:    . allopurinol  100 mg Oral Daily  . amiodarone  200 mg Oral Daily  . apixaban  5 mg Oral BID  . aspirin EC  81 mg Oral Daily  . atorvastatin  40 mg  Oral QHS  . Chlorhexidine Gluconate Cloth  6 each Topical Daily  . darbepoetin (ARANESP) injection - NON-DIALYSIS  150 mcg Subcutaneous Q Thu-1800  . docusate sodium  100 mg Oral Daily  . feeding supplement (ENSURE ENLIVE)  237 mL Oral BID WC  . feeding supplement (PRO-STAT SUGAR FREE 64)  30 mL Oral BID  . umeclidinium bromide  1 puff Inhalation Daily   And  . fluticasone furoate-vilanterol  1 puff Inhalation Daily  . insulin aspart  0-9 Units Subcutaneous TID WC  . insulin glargine  5 Units Subcutaneous QHS  . kidney failure book   Does not apply Once  . mouth rinse  15 mL Mouth Rinse BID  . metoprolol tartrate  25 mg Oral BID  . multivitamin  1 tablet Oral QHS  . pantoprazole  40 mg Oral BID AC  . sertraline  50 mg Oral Daily

## 2019-08-10 NOTE — Plan of Care (Signed)
  Problem: Education: Goal: Knowledge of General Education information will improve Description Including pain rating scale, medication(s)/side effects and non-pharmacologic comfort measures Outcome: Progressing   

## 2019-08-11 LAB — RENAL FUNCTION PANEL
Albumin: 2.1 g/dL — ABNORMAL LOW (ref 3.5–5.0)
Anion gap: 13 (ref 5–15)
BUN: 18 mg/dL (ref 8–23)
CO2: 23 mmol/L (ref 22–32)
Calcium: 8.5 mg/dL — ABNORMAL LOW (ref 8.9–10.3)
Chloride: 101 mmol/L (ref 98–111)
Creatinine, Ser: 2.99 mg/dL — ABNORMAL HIGH (ref 0.44–1.00)
GFR calc Af Amer: 17 mL/min — ABNORMAL LOW (ref >60)
GFR calc non Af Amer: 15 mL/min — ABNORMAL LOW (ref >60)
Glucose, Bld: 116 mg/dL — ABNORMAL HIGH (ref 70–99)
Phosphorus: 3.6 mg/dL (ref 2.5–4.6)
Potassium: 4.2 mmol/L (ref 3.5–5.1)
Sodium: 137 mmol/L (ref 135–145)

## 2019-08-11 LAB — RESP PANEL BY RT PCR (RSV, FLU A&B, COVID)
Influenza A by PCR: NEGATIVE
Influenza B by PCR: NEGATIVE
Respiratory Syncytial Virus by PCR: NEGATIVE
SARS Coronavirus 2 by RT PCR: NEGATIVE

## 2019-08-11 LAB — CBC
HCT: 35.4 % — ABNORMAL LOW (ref 36.0–46.0)
Hemoglobin: 10.9 g/dL — ABNORMAL LOW (ref 12.0–15.0)
MCH: 29 pg (ref 26.0–34.0)
MCHC: 30.8 g/dL (ref 30.0–36.0)
MCV: 94.1 fL (ref 80.0–100.0)
Platelets: 327 10*3/uL (ref 150–400)
RBC: 3.76 MIL/uL — ABNORMAL LOW (ref 3.87–5.11)
RDW: 20.9 % — ABNORMAL HIGH (ref 11.5–15.5)
WBC: 7.2 10*3/uL (ref 4.0–10.5)
nRBC: 0 % (ref 0.0–0.2)

## 2019-08-11 LAB — MAGNESIUM: Magnesium: 1.9 mg/dL (ref 1.7–2.4)

## 2019-08-11 LAB — GLUCOSE, CAPILLARY
Glucose-Capillary: 141 mg/dL — ABNORMAL HIGH (ref 70–99)
Glucose-Capillary: 165 mg/dL — ABNORMAL HIGH (ref 70–99)
Glucose-Capillary: 98 mg/dL (ref 70–99)

## 2019-08-11 NOTE — Progress Notes (Signed)
Patient has been accepted at [facility] on a MWF schedule with a seat time of 10:45am. Her first day of treatment will be Friday, 08/13/19 and she needs to arrive at 10:15am on her first day in order to complete intake paperwork.  Nephrologist/Dr. Johnney Ou notified. CSW/V. Elliott notified. Patient cleared for discharge from a Renal standpoint to have her next HD treatment on Friday.   Alphonzo Cruise, [place] Renal Navigator [phone]

## 2019-08-11 NOTE — TOC Transition Note (Signed)
Transition of Care [facility]) - CM/SW Discharge Note *08/11/19 - Discharged to [facility], transported by ambulance   Patient Details  Name: [name] MRN: [id number] Date of Birth: 02/13/1946  Transition of Care [facility]) CM/SW Contact:  Sable Feil, LCSW Phone Number: 08/11/2019, 4:56 PM   Clinical Narrative:  Patient medically stable for discharge and patient's son and patient decided on [facility], even though facility unable to confirm with [facility] if they are in network. Discharge clinicals transmitted to facility, nurse called report, son Amaryllis Dyke [phone]) contacted and advised of today's discharge and ambulance transport and transport arranged.      Final next level of care: Skilled Nursing Facility([facility]) Barriers to Discharge: No Barriers Identified   Patient Goals and CMS Choice Patient states their goals for this hospitalization and ongoing recovery are:: Patient's agreeable to ST rehab if she is able to discharge to [email or web address] Compare Post Acute Care list provided to:: Other (Comment Required)(Patient expressed her facility preference - [facility] if [facility] unable to accept) Choice offered to / list presented to : Patient  Discharge Placement   Existing PASRR number confirmed : 07/19/19          Patient chooses bed at: [facility] Patient to be transferred to facility by: Ambulance Name of family member notified: Vernice Jefferson - [phone] Patient and family notified of of transfer: 08/11/19  Discharge Plan and [facility] facility for [facility] rehab                                Social Determinants of Health (SDOH) Interventions  No SDOH interventions needed or requested prior to discharge.   Readmission Risk Interventions Readmission Risk Prevention Plan 07/19/2019 07/08/2019 07/07/2019  Transportation Screening Complete Complete Complete  PCP  or Specialist Appt within 3-5 Days - - -  Not Complete comments - - -  HRI or [place] or Home Care Consult comments - - -  Social Work Consult for [place] Planning/Counseling - - -  [place] - - -  Medication Review Press photographer) Complete Complete Complete  PCP or Specialist appointment within 3-5 days of discharge Complete Not Complete -  PCP/Specialist Appt Not Complete comments - Patient will be seen by medical director at [facility] or Home Care Consult Complete Complete -  SW Recovery Care/Counseling Consult Complete Complete Complete  Palliative Care Screening Not Applicable Not Applicable Not Applicable  Skilled Nursing Facility Complete Complete Complete  Some recent data might be hidden

## 2019-08-11 NOTE — Plan of Care (Signed)
  Problem: Clinical Measurements: Goal: Diagnostic test results will improve Outcome: Progressing   Problem: Activity: Goal: Risk for activity intolerance will decrease Outcome: Progressing   

## 2019-08-11 NOTE — Plan of Care (Signed)
  Problem: Clinical Measurements: Goal: Ability to maintain clinical measurements within normal limits will improve Outcome: Adequate for Discharge Goal: Will remain free from infection Outcome: Adequate for Discharge Goal: Diagnostic test results will improve 08/11/2019 1553 by Dolores Hoose, RN Outcome: Adequate for Discharge 08/11/2019 0817 by Dolores Hoose, RN Outcome: Progressing Goal: Respiratory complications will improve Outcome: Adequate for Discharge Goal: Cardiovascular complication will be avoided Outcome: Adequate for Discharge

## 2019-08-11 NOTE — Progress Notes (Signed)
DISCHARGE NOTE SNF [facility] to be discharged Skilled nursing facility per MD order. Patient verbalized understanding.  Skin clean, dry and intact without evidence of skin break down, no evidence of skin tears noted. IV catheter discontinued intact. Site without signs and symptoms of complications. Dressing and pressure applied. Pt denies pain at the site currently. No complaints noted.  Patient free of lines, drains, and wounds.   Discharge packet assembled. An After Visit Summary (AVS) was printed and given to the EMS personnel. Patient escorted via stretcher and discharged to [facility] via ambulance. Report called to accepting facility; all questions and concerns addressed.   Dolores Hoose, RN

## 2019-08-11 NOTE — Progress Notes (Signed)
Patient seen and examined, no changes from Dr. Arlyss Queen discharge summary from yesterday,  -Remains medically stable for discharge to SNF today  Domenic Polite, MD

## 2019-08-14 NOTE — Anesthesia Postprocedure Evaluation (Signed)
Anesthesia Post Note  Patient: [name]  Procedure(s) Performed: ARTERIOVENOUS (AV) FISTULA CREATION LEFT ARM (Left Arm Upper)     Patient location during evaluation: PACU Anesthesia Type: General Level of consciousness: sedated and patient cooperative Pain management: pain level controlled Vital Signs Assessment: post-procedure vital signs reviewed and stable Respiratory status: spontaneous breathing Cardiovascular status: stable Anesthetic complications: no    Last Vitals:  Vitals:   08/11/19 0638 08/11/19 0902  BP: (!) 133/52 (!) 120/47  Pulse: 75 71  Resp: 16 16  Temp: 36.7 C   SpO2: 96% 91%    Last Pain:  Vitals:   08/11/19 1500  TempSrc:   PainSc: 0-No pain                 Nolon Nations

## 2019-09-03 ENCOUNTER — Encounter (HOSPITAL_COMMUNITY): Payer: Medicare Other

## 2019-09-06 NOTE — Progress Notes (Signed)
Established Dialysis Access   History of Present Illness   [name] is a 74 y.o. (1945/11/09) female who presents for re-evaluation of permanent access.  She is s/p left UE 1st stage basilic vein AVF on 02/58/5277 by Dr. Donzetta Matters.   She denies left hand pain, numbness or tingling.    The patient is right hand dominant.  HD currently via left IJ TDC.  She remains on Eliquis (apixaban) for hx of atrial fibrillation  The patient's PMH, PSH, SH, and FamHx were reviewed. HD clininc; [facility] HD treatment days: MWF  Current Outpatient Medications  Medication Sig Dispense Refill  . albuterol (PROVENTIL) (2.5 MG/3ML) 0.083% nebulizer solution Take 2.5 mg by nebulization every 6 (six) hours as needed for wheezing or shortness of breath.     Marland Kitchen albuterol (VENTOLIN HFA) 108 (90 Base) MCG/ACT inhaler Inhale 2 puffs into the lungs every 6 (six) hours as needed for wheezing or shortness of breath.     . allopurinol (ZYLOPRIM) 100 MG tablet Take 1 tablet (100 mg total) by mouth 2 (two) times daily.    Marland Kitchen amiodarone (PACERONE) 200 MG tablet Take 1 tablet (200 mg total) by mouth daily.    Marland Kitchen apixaban (ELIQUIS) 5 MG TABS tablet Take 1 tablet (5 mg total) by mouth 2 (two) times daily. 60 tablet   . aspirin EC 81 MG tablet Take 81 mg by mouth daily.    Marland Kitchen atorvastatin (LIPITOR) 40 MG tablet Take 1 tablet (40 mg total) by mouth at bedtime. 90 tablet 1  . Biotin 5 MG CAPS Take 5 mg by mouth daily.    Marland Kitchen esomeprazole (NEXIUM) 40 MG capsule TAKE 1 CAPSULE TWICE A DAY 30 MINUTES PRIOR TO MEALS 180 capsule 1  . famotidine (PEPCID) 40 MG tablet TAKE 1 TABLET AT BEDTIME AS NEEDED TO CONTROL REFLUX (Patient not taking: Reported on 07/19/2019) 90 tablet 1  . feeding supplement, GLUCERNA SHAKE, (GLUCERNA SHAKE) LIQD Take 237 mLs by mouth 2 (two) times daily between meals.    . hydrALAZINE (APRESOLINE) 25 MG tablet Take 25 mg by mouth 2 (two) times daily.    . insulin aspart (NOVOLOG) 100 UNIT/ML injection  Inject 0-9 Units into the skin 3 (three) times daily with meals. 10 mL 11  . LANTUS SOLOSTAR 100 UNIT/ML SOPN Inject 8 Units into the skin at bedtime.     . metoprolol tartrate (LOPRESSOR) 25 MG tablet Take 1 tablet (25 mg total) by mouth 2 (two) times daily.    . potassium chloride SA (KLOR-CON) 10 MEQ tablet Take 1 tablet (10 mEq total) by mouth every other day.    . sertraline (ZOLOFT) 50 MG tablet Take 1 tablet (50 mg total) by mouth daily. 3 tablet 0  . tamsulosin (FLOMAX) 0.4 MG CAPS capsule Take 1 capsule (0.4 mg total) by mouth daily. 30 capsule   . TRELEGY ELLIPTA 100-62.5-25 MCG/INH AEPB Inhale 1 puff into the lungs daily.     No current facility-administered medications for this visit.    On ROS today: See HPI   Physical Examination   Vitals:   09/09/19 1129  Weight: 167 lb (75.8 kg)  Height: 4\' 11"  (1.499 m)    General Alert, O x 3, Obese, NAD  Pulmonary Sym exp, good B air movt, nonlabored  Cardiac Irregularly, irregular rate and rhythm, not eval, not eval, not eval  Vascular Vessel Right Left  Radial Not palpable Not palpable  Brachial not eval right fitula with good bruit  Ulnar Not eval Not palp    Musculo- skeletal M/S 5/5 throughout left hand, Extremities without ischemic changes none  Neurologic A&O; CN grossly intact  Good thrill in fistula. Biphasic ulnar and radial pulses     Non-invasive Vascular Imaging   left Arm Access Duplex  (basilic vein):   Diameters:  .59-.69 mm  Depth:  1.58-2.07 mm  PSV:  167 c/s    Medical Decision Making   [name] is a 74 y.o. female who presents with ESRD requiring hemodialysis.    The patient's vein has matured to greater than 6 mm.  We will make arrangements for second stage left upper extremity basilic vein AV fistula transposition with Dr. Donzetta Matters. Continue HD treatments via left IJ catheter.  Barbie Banner, PA-C [facility] Office: [phone]  Clinic MD:  Oneida Alar

## 2019-09-08 ENCOUNTER — Other Ambulatory Visit: Payer: Self-pay

## 2019-09-08 DIAGNOSIS — N183 Chronic kidney disease, stage 3 unspecified: Secondary | ICD-10-CM

## 2019-09-08 DIAGNOSIS — E1122 Type 2 diabetes mellitus with diabetic chronic kidney disease: Secondary | ICD-10-CM

## 2019-09-09 ENCOUNTER — Other Ambulatory Visit: Payer: Self-pay

## 2019-09-09 ENCOUNTER — Ambulatory Visit (INDEPENDENT_AMBULATORY_CARE_PROVIDER_SITE_OTHER): Payer: Self-pay | Admitting: Physician Assistant

## 2019-09-09 ENCOUNTER — Ambulatory Visit (HOSPITAL_COMMUNITY)
Admission: RE | Admit: 2019-09-09 | Discharge: 2019-09-09 | Disposition: A | Discharge status: Home / Self Care | Payer: No Typology Code available for payment source | Source: Ambulatory Visit | Attending: Vascular Surgery | Admitting: Vascular Surgery

## 2019-09-09 VITALS — BP 80/46 | HR 63 | Temp 96.3°F | Resp 14 | Ht 59.0 in | Wt 167.0 lb

## 2019-09-09 DIAGNOSIS — N183 Chronic kidney disease, stage 3 unspecified: Secondary | ICD-10-CM | POA: Insufficient documentation

## 2019-09-09 DIAGNOSIS — E1122 Type 2 diabetes mellitus with diabetic chronic kidney disease: Secondary | ICD-10-CM | POA: Insufficient documentation

## 2019-09-09 DIAGNOSIS — Z992 Dependence on renal dialysis: Secondary | ICD-10-CM

## 2019-09-09 DIAGNOSIS — N186 End stage renal disease: Secondary | ICD-10-CM

## 2019-09-22 ENCOUNTER — Encounter (HOSPITAL_COMMUNITY): Payer: Self-pay | Admitting: Physician Assistant

## 2019-09-22 ENCOUNTER — Encounter (HOSPITAL_COMMUNITY): Payer: Self-pay | Admitting: Vascular Surgery

## 2019-09-22 ENCOUNTER — Other Ambulatory Visit: Payer: Self-pay

## 2019-09-22 NOTE — Progress Notes (Signed)
Anesthesia Chart Review: Same day workup  Recent hospitalization 07/17/19-08/10/19. Admitted initially for coffee ground emesis. Per discharge summary, "CKD 3P COPD chronic respiratory failure OSA/CPAP (on trilogy) DM TY 2 A. fib >Chad score 4 on anticoagulation HTN, some urinary retention, IBS. Initially admitted by [facility] after having an episode of coffee-ground emesis nausea vomiting had active bleeding ulcers she was transferred for consideration of CRRT. Coffee-ground emesis-resolved-now on regular diet-continue Protonix 40 twice daily. She was seen by nephrology and has gradually stabilized has been deemed ESRD. She had a left brachiocephalic access placed which is immature and she will get dialysis through her chest port." She also had Afib with RVR, cardiology consulted, discharged on Eliquis and was maintaining sinus rhythm on amiodarone. Echo during admission showed EF 55-60%, moderate TR, moderate increased pulm artery pressure. She has chronic respiratory failure and COPD at baseline and is on home O2.   Currently dialyzing through left IJ TDC.  She will need DOS labs and eval.   EKG 07/28/19: Normal sinus rhythm. Rate 69. Incomplete left bundle branch block  Echo 07/27/19: 1. Left ventricular ejection fraction, by visual estimation, is 55 to  60%. The left ventricle has normal function. There is mildly increased  left ventricular hypertrophy.  2. Left ventricular diastolic function could not be evaluated.  3. The left ventricle has no regional wall motion abnormalities.  4. Global right ventricle has normal systolic function.The right  ventricular size is normal. No increase in right ventricular wall  thickness.  5. Left atrial size was moderately dilated.  6. Right atrial size was mildly dilated.  7. Mild mitral annular calcification.  8. The mitral valve is degenerative. Trivial mitral valve regurgitation.  9. The tricuspid valve is grossly normal. Tricuspid valve  regurgitation  moderate.  10. The tricuspid valve was normal in structure. Tricuspid valve  regurgitation moderate.  11. The aortic valve is grossly normal. Aortic valve regurgitation is not  visualized.  12. Moderately elevated pulmonary artery systolic pressure.  13. The tricuspid regurgitant velocity is 3.12 m/s, and with an assumed  right atrial pressure of 15 mmHg, the estimated right ventricular systolic  pressure is moderately elevated at 54.0 mmHg.  14. The inferior vena cava is dilated in size with <50% respiratory  variability, suggesting right atrial pressure of 15 mmHg.    [name] [facility] Short Stay Center/Anesthesiology Phone [phone] 09/22/2019 12:21 PM

## 2019-09-22 NOTE — Progress Notes (Signed)
Surgery was cancelled due to patient did not stop eliquis.  [name] at MD's office to reschedule this procedure.  Nonie Hoyer, Unit Mgr at [facility] [phone] was informed.  OR desk informed.

## 2019-09-22 NOTE — Anesthesia Preprocedure Evaluation (Deleted)
Anesthesia Evaluation    Airway        Dental   Pulmonary former smoker,           Cardiovascular hypertension,      Neuro/Psych    GI/Hepatic   Endo/Other  diabetes  Renal/GU      Musculoskeletal   Abdominal   Peds  Hematology   Anesthesia Other Findings   Reproductive/Obstetrics                             Anesthesia Physical Anesthesia Plan  ASA:   Anesthesia Plan:    Post-op Pain Management:    Induction:   PONV Risk Score and Plan:   Airway Management Planned:   Additional Equipment:   Intra-op Plan:   Post-operative Plan:   Informed Consent:   Plan Discussed with:   Anesthesia Plan Comments: (Recent hospitalization 07/17/19-08/10/19. Admitted initially for coffee ground emesis. Per discharge summary, "CKD 3P COPD chronic respiratory failure OSA/CPAP (on trilogy) DM TY 2 A. fib >Chad score 4 on anticoagulation HTN, some urinary retention, IBS. Initially admitted by [facility] after having an episode of coffee-ground emesis nausea vomiting had active bleeding ulcers she was transferred for consideration of CRRT. Coffee-ground emesis-resolved-now on regular diet-continue Protonix 40 twice daily. She was seen by nephrology and has gradually stabilized has been deemed ESRD. She had a left brachiocephalic access placed which is immature and she will get dialysis through her chest port." She also had Afib with RVR, cardiology consulted, discharged on Eliquis and was maintaining sinus rhythm on amiodarone. Echo during admission showed EF 55-60%, moderate TR, moderate increased pulm artery pressure. She has chronic respiratory failure and COPD at baseline and is on home O2.   Currently dialyzing through left IJ TDC.  She will need DOS labs and eval.   EKG 07/28/19: Normal sinus rhythm. Rate 69. Incomplete left bundle branch block  Echo 07/27/19: 1. Left ventricular ejection fraction, by  visual estimation, is 55 to  60%. The left ventricle has normal function. There is mildly increased  left ventricular hypertrophy.  2. Left ventricular diastolic function could not be evaluated.  3. The left ventricle has no regional wall motion abnormalities.  4. Global right ventricle has normal systolic function.The right  ventricular size is normal. No increase in right ventricular wall  thickness.  5. Left atrial size was moderately dilated.  6. Right atrial size was mildly dilated.  7. Mild mitral annular calcification.  8. The mitral valve is degenerative. Trivial mitral valve regurgitation.  9. The tricuspid valve is grossly normal. Tricuspid valve regurgitation  moderate.  10. The tricuspid valve was normal in structure. Tricuspid valve  regurgitation moderate.  11. The aortic valve is grossly normal. Aortic valve regurgitation is not  visualized.  12. Moderately elevated pulmonary artery systolic pressure.  13. The tricuspid regurgitant velocity is 3.12 m/s, and with an assumed  right atrial pressure of 15 mmHg, the estimated right ventricular systolic  pressure is moderately elevated at 54.0 mmHg.  14. The inferior vena cava is dilated in size with <50% respiratory  variability, suggesting right atrial pressure of 15 mmHg. )        Anesthesia Quick Evaluation

## 2019-09-23 ENCOUNTER — Ambulatory Visit (HOSPITAL_COMMUNITY): Admission: RE | Admit: 2019-09-23 | Payer: Medicare Other | Source: Home / Self Care | Admitting: Vascular Surgery

## 2019-09-23 HISTORY — DX: Cardiac arrhythmia, unspecified: I49.9

## 2019-09-23 SURGERY — TRANSPOSITION, VEIN, BASILIC
Anesthesia: Monitor Anesthesia Care | Laterality: Left

## 2019-10-04 ENCOUNTER — Encounter (HOSPITAL_COMMUNITY): Payer: Self-pay | Admitting: Vascular Surgery

## 2019-10-04 ENCOUNTER — Other Ambulatory Visit: Payer: Self-pay

## 2019-10-04 NOTE — Progress Notes (Addendum)
I spoke with [name]., Ms Carta' nurse. Patient has not had any chest pain or shortness of breath. Patient will be tested for Covid in am on arrival. Ms Scaglione has type II diabetes, CBG per Crystal run 65- 315.  I instructed Crystal if CBG > 220 in am to give her 1/ 2 dose of SS Insulin.  If CBG < 70 to give patient 1 tube of glucose gel and recheck in 15 minutes. Call SS Desk if it dose not respond  I  Informed Crystal that patient should not have SS Insulin tonight. I gave instruction to take 1/2 of Lantus tonight- 4 units.   I instructed that patient should be bathed, brush teeth and wear clean clothes, no lotions,powders or cologne.and bring no valuables.

## 2019-10-04 NOTE — Pre-Procedure Instructions (Addendum)
    [name]  10/04/2019    Your procedure is scheduled on 10/05/2019.  Report to [facility], Main Entrance or Entrance "A" at 5:30 A.M.   Call this number if you have problems the morning of surgery:[phone]  This is the number for the Pre- Surgical Desk.   >>>>>Please send patient's Medication Record with medications administrated documentation. ( this information is required prior to OR. This includes medications that may have been on hold for surgery)<<<<<  Eliquis should have been for 3 days before surgery.   Remember:  Do not eat or drink after midnight.   Take these medicines the morning of surgery with A SIP OF WATER: Allopurinol Aspirin Amiodarone Hydralazine Metoprolol Omeprazole Use Trelegy Elliipta  Take/use if needed:  Tylenol NTG Albuterol inhaler   STOP taking  Aspirin Products (Goody Powder, Excedrin Migraine), Ibuprofen (Advil), Naproxen (Aleve), Vitamins and Herbal Products (ie Fish Oil).  WHAT DO I DO ABOUT MY DIABETES MEDICATION? Marland Kitchen Do not take oral diabetes medicines (pills) the morning of surgery. .  THE MORNING OF SURGERY . THE NIGHT BEFORE SURGERY, take __2___ units of _Lantus_insulin.  .  If your CBG is greater than 220 mg/dL, you may take  of your sliding scale (correction) dose of insulin  . If your blood sugar is less than 70 mg/dL, you will need to treat for low blood sugar:. o Treat a low blood sugar (less than 70 mg/dL) with 1 tube of glucose gel. o  Recheck blood sugar in 15 minutes after treatment (to make sure it is greater than 70 mg/dL). If your blood sugar is not greater than 70 mg/dL on recheck, call [phone] for further instructions.       Patient should have showered, wear clean clothes and brushed teeth.          Do not wear jewelry, make-up or nail polish.  Do not wear lotions, powders, or perfumes, or deodorant.  Do not shave 48 hours prior to surgery.    Do not bring valuables to the  hospital.  [facility] is not responsible for any belongings or valuables.  Contacts, dentures or bridgework may not be worn into surgery.   Could not fax to either fax number at [facility], many attempts made. I gave all instructions verbally.

## 2019-10-05 ENCOUNTER — Ambulatory Visit (HOSPITAL_COMMUNITY): Payer: Medicare Other | Admitting: Certified Registered Nurse Anesthetist

## 2019-10-05 ENCOUNTER — Ambulatory Visit (HOSPITAL_COMMUNITY)
Admission: RE | Admit: 2019-10-05 | Discharge: 2019-10-05 | Disposition: A | Discharge status: Skilled Nursing Facility | Payer: Medicare Other | Attending: Vascular Surgery | Admitting: Vascular Surgery

## 2019-10-05 ENCOUNTER — Other Ambulatory Visit: Payer: Self-pay

## 2019-10-05 ENCOUNTER — Encounter (HOSPITAL_COMMUNITY): Payer: Self-pay | Admitting: Vascular Surgery

## 2019-10-05 ENCOUNTER — Encounter (HOSPITAL_COMMUNITY)
Admission: RE | Disposition: A | Discharge status: Skilled Nursing Facility | Payer: Self-pay | Source: Home / Self Care | Attending: Vascular Surgery

## 2019-10-05 DIAGNOSIS — Z7901 Long term (current) use of anticoagulants: Secondary | ICD-10-CM | POA: Diagnosis not present

## 2019-10-05 DIAGNOSIS — I4891 Unspecified atrial fibrillation: Secondary | ICD-10-CM | POA: Insufficient documentation

## 2019-10-05 DIAGNOSIS — Z992 Dependence on renal dialysis: Secondary | ICD-10-CM | POA: Diagnosis not present

## 2019-10-05 DIAGNOSIS — Z7982 Long term (current) use of aspirin: Secondary | ICD-10-CM | POA: Insufficient documentation

## 2019-10-05 DIAGNOSIS — Z794 Long term (current) use of insulin: Secondary | ICD-10-CM | POA: Insufficient documentation

## 2019-10-05 DIAGNOSIS — Z79899 Other long term (current) drug therapy: Secondary | ICD-10-CM | POA: Diagnosis not present

## 2019-10-05 DIAGNOSIS — N186 End stage renal disease: Secondary | ICD-10-CM | POA: Insufficient documentation

## 2019-10-05 DIAGNOSIS — N185 Chronic kidney disease, stage 5: Secondary | ICD-10-CM | POA: Diagnosis not present

## 2019-10-05 DIAGNOSIS — Z20822 Contact with and (suspected) exposure to covid-19: Secondary | ICD-10-CM | POA: Insufficient documentation

## 2019-10-05 HISTORY — PX: BASCILIC VEIN TRANSPOSITION: SHX5742

## 2019-10-05 LAB — POCT I-STAT, CHEM 8
BUN: 9 mg/dL (ref 8–23)
Calcium, Ion: 1.1 mmol/L — ABNORMAL LOW (ref 1.15–1.40)
Chloride: 100 mmol/L (ref 98–111)
Creatinine, Ser: 2.8 mg/dL — ABNORMAL HIGH (ref 0.44–1.00)
Glucose, Bld: 86 mg/dL (ref 70–99)
HCT: 35 % — ABNORMAL LOW (ref 36.0–46.0)
Hemoglobin: 11.9 g/dL — ABNORMAL LOW (ref 12.0–15.0)
Potassium: 3.8 mmol/L (ref 3.5–5.1)
Sodium: 140 mmol/L (ref 135–145)
TCO2: 32 mmol/L (ref 22–32)

## 2019-10-05 LAB — RESPIRATORY PANEL BY RT PCR (FLU A&B, COVID)
Influenza A by PCR: NEGATIVE
Influenza B by PCR: NEGATIVE
SARS Coronavirus 2 by RT PCR: NEGATIVE

## 2019-10-05 LAB — GLUCOSE, CAPILLARY
Glucose-Capillary: 91 mg/dL (ref 70–99)
Glucose-Capillary: 126 mg/dL — ABNORMAL HIGH (ref 70–99)

## 2019-10-05 SURGERY — TRANSPOSITION, VEIN, BASILIC
Anesthesia: Monitor Anesthesia Care | Site: Arm Upper | Laterality: Left

## 2019-10-05 MED ORDER — PROPOFOL 10 MG/ML IV BOLUS
INTRAVENOUS | Status: AC
  Filled 2019-10-05: qty 20

## 2019-10-05 MED ORDER — OXYCODONE HCL 5 MG/5ML PO SOLN: 5.0000 mg | Freq: Once | ORAL | Status: DC | PRN

## 2019-10-05 MED ORDER — PHENYLEPHRINE 40 MCG/ML (10ML) SYRINGE FOR IV PUSH (FOR BLOOD PRESSURE SUPPORT)
PREFILLED_SYRINGE | INTRAVENOUS | Status: DC | PRN
  Administered 2019-10-05 (×3): 120 ug via INTRAVENOUS

## 2019-10-05 MED ORDER — SODIUM CHLORIDE 0.9 % IV SOLN
INTRAVENOUS | Status: AC
  Filled 2019-10-05: qty 1.2

## 2019-10-05 MED ORDER — EPHEDRINE SULFATE-NACL 50-0.9 MG/10ML-% IV SOSY
PREFILLED_SYRINGE | INTRAVENOUS | Status: DC | PRN
  Administered 2019-10-05: 10 mg via INTRAVENOUS

## 2019-10-05 MED ORDER — SODIUM CHLORIDE 0.9 % IV SOLN
INTRAVENOUS | Status: DC | PRN
  Administered 2019-10-05: 500 mL

## 2019-10-05 MED ORDER — 0.9 % SODIUM CHLORIDE (POUR BTL) OPTIME
TOPICAL | Status: DC | PRN
  Administered 2019-10-05: 1000 mL

## 2019-10-05 MED ORDER — HYDROCODONE-ACETAMINOPHEN 5-325 MG PO TABS: 1.0000 | ORAL_TABLET | Freq: Four times a day (QID) | ORAL | 0 refills | Status: DC | PRN

## 2019-10-05 MED ORDER — LIDOCAINE-EPINEPHRINE 1 %-1:100000 IJ SOLN
INTRAMUSCULAR | Status: AC
  Filled 2019-10-05: qty 1

## 2019-10-05 MED ORDER — HEPARIN SODIUM (PORCINE) 1000 UNIT/ML IJ SOLN
1900.0000 [IU] | Freq: Once | INTRAMUSCULAR | Status: AC
  Administered 2019-10-05: 1900 [IU]

## 2019-10-05 MED ORDER — FENTANYL CITRATE (PF) 100 MCG/2ML IJ SOLN
INTRAMUSCULAR | Status: DC | PRN
  Administered 2019-10-05: 25 ug via INTRAVENOUS
  Administered 2019-10-05: 50 ug via INTRAVENOUS

## 2019-10-05 MED ORDER — LIDOCAINE-EPINEPHRINE 0.5 %-1:200000 IJ SOLN
INTRAMUSCULAR | Status: AC
  Filled 2019-10-05: qty 1

## 2019-10-05 MED ORDER — ONDANSETRON HCL 4 MG/2ML IJ SOLN
INTRAMUSCULAR | Status: AC
  Filled 2019-10-05: qty 2

## 2019-10-05 MED ORDER — CHLORHEXIDINE GLUCONATE 4 % EX LIQD: 60.0000 mL | Freq: Once | CUTANEOUS | Status: DC

## 2019-10-05 MED ORDER — PROMETHAZINE HCL 25 MG/ML IJ SOLN: 6.2500 mg | INTRAMUSCULAR | Status: DC | PRN

## 2019-10-05 MED ORDER — PAPAVERINE HCL 30 MG/ML IJ SOLN
INTRAMUSCULAR | Status: AC
  Filled 2019-10-05: qty 2

## 2019-10-05 MED ORDER — PROPOFOL 500 MG/50ML IV EMUL
INTRAVENOUS | Status: DC | PRN
  Administered 2019-10-05: 85 mg via INTRAVENOUS
  Administered 2019-10-05: 100 ug/kg/min via INTRAVENOUS

## 2019-10-05 MED ORDER — FENTANYL CITRATE (PF) 250 MCG/5ML IJ SOLN
INTRAMUSCULAR | Status: AC
  Filled 2019-10-05: qty 5

## 2019-10-05 MED ORDER — EPHEDRINE 5 MG/ML INJ
INTRAVENOUS | Status: AC
  Filled 2019-10-05: qty 10

## 2019-10-05 MED ORDER — LIDOCAINE-EPINEPHRINE (PF) 1 %-1:200000 IJ SOLN
INTRAMUSCULAR | Status: DC | PRN
  Administered 2019-10-05 (×3): 20 mL

## 2019-10-05 MED ORDER — SODIUM CHLORIDE 0.9 % IV SOLN: INTRAVENOUS | Status: DC

## 2019-10-05 MED ORDER — LIDOCAINE HCL (PF) 1 % IJ SOLN
INTRAMUSCULAR | Status: AC
  Filled 2019-10-05: qty 30

## 2019-10-05 MED ORDER — PHENYLEPHRINE HCL-NACL 10-0.9 MG/250ML-% IV SOLN
INTRAVENOUS | Status: DC | PRN
  Administered 2019-10-05: 40 ug/min via INTRAVENOUS

## 2019-10-05 MED ORDER — CEFAZOLIN SODIUM-DEXTROSE 2-4 GM/100ML-% IV SOLN
2.0000 g | INTRAVENOUS | Status: DC
  Filled 2019-10-05: qty 100

## 2019-10-05 MED ORDER — ONDANSETRON HCL 4 MG/2ML IJ SOLN
INTRAMUSCULAR | Status: DC | PRN
  Administered 2019-10-05: 4 mg via INTRAVENOUS

## 2019-10-05 MED ORDER — PHENYLEPHRINE HCL (PRESSORS) 10 MG/ML IV SOLN
INTRAVENOUS | Status: AC
  Filled 2019-10-05: qty 1

## 2019-10-05 MED ORDER — HYDROMORPHONE HCL 1 MG/ML IJ SOLN: 0.2500 mg | INTRAMUSCULAR | Status: DC | PRN

## 2019-10-05 MED ORDER — OXYCODONE HCL 5 MG PO TABS: 5.0000 mg | ORAL_TABLET | Freq: Once | ORAL | Status: DC | PRN

## 2019-10-05 SURGICAL SUPPLY — 35 items
ADH SKN CLS APL DERMABOND .7 (GAUZE/BANDAGES/DRESSINGS) ×1
ARMBAND PINK RESTRICT EXTREMIT (MISCELLANEOUS) ×2 IMPLANT
CANISTER SUCT 3000ML PPV (MISCELLANEOUS) ×2 IMPLANT
CLIP VESOCCLUDE MED 24/CT (CLIP) IMPLANT
CLIP VESOCCLUDE MED 6/CT (CLIP) IMPLANT
CLIP VESOCCLUDE SM WIDE 24/CT (CLIP) IMPLANT
CLIP VESOCCLUDE SM WIDE 6/CT (CLIP) IMPLANT
COVER PROBE W GEL 5X96 (DRAPES) ×2 IMPLANT
COVER WAND RF STERILE (DRAPES) ×2 IMPLANT
DECANTER SPIKE VIAL GLASS SM (MISCELLANEOUS) ×3 IMPLANT
DERMABOND ADVANCED (GAUZE/BANDAGES/DRESSINGS) ×1
DERMABOND ADVANCED .7 DNX12 (GAUZE/BANDAGES/DRESSINGS) ×1 IMPLANT
ELECT REM PT RETURN 9FT ADLT (ELECTROSURGICAL) ×2
ELECTRODE REM PT RTRN 9FT ADLT (ELECTROSURGICAL) ×1 IMPLANT
GLOVE BIO SURGEON STRL SZ7.5 (GLOVE) ×3 IMPLANT
GLOVE BIOGEL PI IND STRL 6.5 (GLOVE) IMPLANT
GLOVE BIOGEL PI INDICATOR 6.5 (GLOVE) ×1
GLOVE SURG SS PI 6.5 STRL IVOR (GLOVE) ×1 IMPLANT
GOWN STRL REUS W/ TWL LRG LVL3 (GOWN DISPOSABLE) ×2 IMPLANT
GOWN STRL REUS W/ TWL XL LVL3 (GOWN DISPOSABLE) ×1 IMPLANT
GOWN STRL REUS W/TWL LRG LVL3 (GOWN DISPOSABLE) ×4
GOWN STRL REUS W/TWL XL LVL3 (GOWN DISPOSABLE) ×2
KIT BASIN OR (CUSTOM PROCEDURE TRAY) ×2 IMPLANT
KIT TURNOVER KIT B (KITS) ×2 IMPLANT
NS IRRIG 1000ML POUR BTL (IV SOLUTION) ×2 IMPLANT
PACK CV ACCESS (CUSTOM PROCEDURE TRAY) ×2 IMPLANT
PAD ARMBOARD 7.5X6 YLW CONV (MISCELLANEOUS) ×4 IMPLANT
SUT MNCRL AB 4-0 PS2 18 (SUTURE) ×4 IMPLANT
SUT PROLENE 6 0 BV (SUTURE) ×3 IMPLANT
SUT SILK 2 0 SH (SUTURE) IMPLANT
SUT VIC AB 3-0 SH 27 (SUTURE) ×6
SUT VIC AB 3-0 SH 27X BRD (SUTURE) ×1 IMPLANT
TOWEL GREEN STERILE (TOWEL DISPOSABLE) ×2 IMPLANT
UNDERPAD 30X30 (UNDERPADS AND DIAPERS) ×1 IMPLANT
WATER STERILE IRR 1000ML POUR (IV SOLUTION) ×2 IMPLANT

## 2019-10-05 NOTE — Anesthesia Preprocedure Evaluation (Signed)
Anesthesia Evaluation    Airway Mallampati: II  TM Distance: >3 FB Neck ROM: Full    Dental no notable dental hx.    Pulmonary sleep apnea , COPD, former smoker,    Pulmonary exam normal breath sounds clear to auscultation       Cardiovascular hypertension, Pt. on medications + CAD and +CHF  Normal cardiovascular exam+ dysrhythmias Atrial Fibrillation  Rhythm:Regular Rate:Normal     Neuro/Psych Depression    GI/Hepatic GERD  ,  Endo/Other  diabetes  Renal/GU ESRF and DialysisRenal disease     Musculoskeletal  (+) Arthritis , Osteoarthritis,    Abdominal (+) + obese,   Peds  Hematology  (+) anemia ,   Anesthesia Other Findings   Reproductive/Obstetrics                             Anesthesia Physical Anesthesia Plan  ASA: IV  Anesthesia Plan: MAC   Post-op Pain Management:    Induction: Intravenous  PONV Risk Score and Plan: 2 and Ondansetron, Midazolam and Treatment may vary due to age or medical condition  Airway Management Planned: Simple Face Mask  Additional Equipment:   Intra-op Plan:   Post-operative Plan:   Informed Consent: I have reviewed the patients History and Physical, chart, labs and discussed the procedure including the risks, benefits and alternatives for the proposed anesthesia with the patient or authorized representative who has indicated his/her understanding and acceptance.     Dental advisory given  Plan Discussed with: CRNA  Anesthesia Plan Comments:         Anesthesia Quick Evaluation

## 2019-10-05 NOTE — Discharge Instructions (Signed)
° °  [facility] [place] ° °Discharge Instructions ° °AV Fistula or Graft Surgery for Dialysis Access ° °Please refer to the following instructions for your post-procedure care. Your surgeon or physician assistant will discuss any changes with you. ° °Activity ° °You may drive the day following your surgery, if you are comfortable and no longer taking prescription pain medication. Resume full activity as the soreness in your incision resolves. ° °Bathing/Showering ° °You may shower after you go home. Keep your incision dry for 48 hours. Do not soak in a bathtub, hot tub, or swim until the incision heals completely. You may not shower if you have a hemodialysis catheter. ° °Incision Care ° °Clean your incision with mild soap and water after 48 hours. Pat the area dry with a clean towel. You do not need a bandage unless otherwise instructed. Do not apply any ointments or creams to your incision. You may have skin glue on your incision. Do not peel it off. It will come off on its own in about one week. Your arm may swell a bit after surgery. To reduce swelling use pillows to elevate your arm so it is above your heart. Your doctor will tell you if you need to lightly wrap your arm with an ACE bandage. ° °Diet ° °Resume your normal diet. There are not special food restrictions following this procedure. In order to heal from your surgery, it is CRITICAL to get adequate nutrition. Your body requires vitamins, minerals, and protein. Vegetables are the best source of vitamins and minerals. Vegetables also provide the perfect balance of protein. Processed food has little nutritional value, so try to avoid this. ° °Medications ° °Resume taking all of your medications. If your incision is causing pain, you may take over-the counter pain relievers such as acetaminophen (Tylenol). If you were prescribed a stronger pain medication, please be aware these medications can cause nausea and constipation. Prevent  nausea by taking the medication with a snack or meal. Avoid constipation by drinking plenty of fluids and eating foods with high amount of fiber, such as fruits, vegetables, and grains. Do not take Tylenol if you are taking prescription pain medications. ° ° ° ° °Follow up °Your surgeon may want to see you in the office following your access surgery. If so, this will be arranged at the time of your surgery. ° °Please call us immediately for any of the following conditions: ° °Increased pain, redness, drainage (pus) from your incision site °Fever of 101 degrees or higher °Severe or worsening pain at your incision site °Hand pain or numbness. ° °Reduce your risk of vascular disease: ° °Stop smoking. If you would like help, call [facility] at 1-800-QUIT-NOW (1-800-784-8669) or [place] at 336-586-4000 ° °Manage your cholesterol °Maintain a desired weight °Control your diabetes °Keep your blood pressure down ° °Dialysis ° °It will take several weeks to several months for your new dialysis access to be ready for use. Your surgeon will determine when it is OK to use it. Your nephrologist will continue to direct your dialysis. You can continue to use your Permcath until your new access is ready for use. ° °If you have any questions, please call the office at [phone]. ° °

## 2019-10-05 NOTE — H&P (Signed)
   History and Physical Update  The patient was interviewed and re-examined.  The patient's previous History and Physical has been reviewed and is unchanged from recent office visit. The fistula has matured to greater than 26mm but has only a weak thrill. Will plan for 2nd stage bvt vs graft today in OR.   Lynden Carrithers C. Donzetta Matters, MD [facility] [place] Office: [phone] Pager: [phone]

## 2019-10-05 NOTE — Transfer of Care (Signed)
Immediate Anesthesia Transfer of Care Note  Patient: [name]  Procedure(s) Performed: SECOND STAGE LEFT BASCILIC VEIN TRANSPOSITION (Left Arm Upper)  Patient Location: PACU  Anesthesia Type:MAC  Level of Consciousness: awake, alert  and oriented  Airway & Oxygen Therapy: Patient Spontanous Breathing and Patient connected to face mask oxygen  Post-op Assessment: Report given to RN, Post -op Vital signs reviewed and stable and Patient moving all extremities  Post vital signs: Reviewed and stable  Last Vitals:  Vitals Value Taken Time  BP 105/36 10/05/19 0904  Temp    Pulse 85 10/05/19 0908  Resp 21 10/05/19 0908  SpO2 98 % 10/05/19 0908  Vitals shown include unvalidated device data.  Last Pain:  Vitals:   10/05/19 0905  PainSc: (P) Asleep         Complications: No apparent anesthesia complications

## 2019-10-05 NOTE — Anesthesia Postprocedure Evaluation (Signed)
Anesthesia Post Note  Patient: NOURA PURPURA  Procedure(s) Performed: SECOND STAGE LEFT BASCILIC VEIN TRANSPOSITION (Left Arm Upper)     Patient location during evaluation: PACU Anesthesia Type: MAC Level of consciousness: awake and alert Pain management: pain level controlled Vital Signs Assessment: post-procedure vital signs reviewed and stable Respiratory status: spontaneous breathing, nonlabored ventilation and respiratory function stable Cardiovascular status: blood pressure returned to baseline and stable Postop Assessment: no apparent nausea or vomiting Anesthetic complications: no    Last Vitals:  Vitals:   10/05/19 0920 10/05/19 0935  BP: (!) 103/38 (!) 105/43  Pulse: 81 81  Resp: 16 20  Temp:  36.6 C  SpO2: 97% 95%    Last Pain:  Vitals:   10/05/19 0935  PainSc: 0-No pain                 Lynda Rainwater

## 2019-10-05 NOTE — Op Note (Signed)
    Patient name: [name] MRN: [id number] DOB: [birth year] Sex: female  10/05/2019 Pre-operative Diagnosis: End-stage renal disease Post-operative diagnosis:  Same Surgeon:  Erlene Quan C. Donzetta Matters, MD Assistant: Arlee Muslim, PA Procedure Performed:  Left arm second stage basilic vein fistula transposition  Indications: 74 year old female with end-stage renal disease currently on dialysis via catheter.  She has undergone a left arm first stage basilic vein fistula creation.  She is now indicated for the second stage.  Findings: The vein did have good flow throughout.  It was somewhat immature for greater than 66-week old fistula.  At completion there was a good signal in the renal vein as well as the radial and ulnar arteries at the wrist.   Procedure:  The patient was identified in the holding area and taken to the operating room where MAC anesthesia was induced.  She was sterilely prepped and draped in the left upper extremity usual fashion, antibiotics were ministered, a timeout was called.  We began by anesthetizing the left upper arm at the expected incision site as well as tunneling site with 1% lidocaine with epinephrine.  We then reopened her previous incision.  We dissected down to the fistula.  We then made 2 separate skip incisions to the level of the axilla.  Branches were taken between clips and ties.  When the vein was fully freed up we marked it for orientation near the previous anastomosis and clamped it.  We transected it.  We then flushed with heparinized saline there was one area for repair.  We tunneled it laterally and again flushed with heparinized saline.  We spatulated both ends sewed and and with 6-0 Prolene suture.  Prior completion without flushing all directions.  Upon completion there was a good thrill and pulsatility with compression of the fistula.  She had good radial and ulnar artery signals at the wrist.  We irrigated the wounds obtain hemostasis and closed in layers  with Vicryl and Monocryl.  Dermabond was placed to the level of the skin.  She was awake from anesthesia having tolerated procedure without immediate complication.  All counts were correct at completion.  EBL: 20 cc    Stephannie Broner C. Donzetta Matters, MD [facility] [place] Office: [phone] Pager: [phone]

## 2019-10-22 ENCOUNTER — Encounter: Payer: Self-pay | Admitting: Gastroenterology

## 2019-10-22 ENCOUNTER — Other Ambulatory Visit: Payer: Self-pay

## 2019-10-22 ENCOUNTER — Ambulatory Visit (INDEPENDENT_AMBULATORY_CARE_PROVIDER_SITE_OTHER): Payer: Medicare Other | Admitting: Gastroenterology

## 2019-10-22 VITALS — BP 101/51 | HR 65 | Temp 95.9°F | Ht 59.0 in | Wt 165.6 lb

## 2019-10-22 DIAGNOSIS — K449 Diaphragmatic hernia without obstruction or gangrene: Secondary | ICD-10-CM | POA: Diagnosis not present

## 2019-10-22 DIAGNOSIS — K2100 Gastro-esophageal reflux disease with esophagitis, without bleeding: Secondary | ICD-10-CM

## 2019-10-22 DIAGNOSIS — D649 Anemia, unspecified: Secondary | ICD-10-CM | POA: Diagnosis not present

## 2019-10-22 DIAGNOSIS — R634 Abnormal weight loss: Secondary | ICD-10-CM | POA: Insufficient documentation

## 2019-10-22 NOTE — Progress Notes (Signed)
Primary Care Physician: [name], MD  Primary Gastroenterologist:  Barney Drain, MD   Chief Complaint  Patient presents with  . Anemia    hospital f/u    HPI: [name] is a 74 y.o. female here for hospital follow-up. She is seen back in December during hospitalization coffee-ground emesis in the setting of Eliquis and Aleve. She has a history of A. fib on Eliquis, COPD, diastolic heart failure, chronic kidney disease, gout, morbid obesity.CT demonstrated large hiatal hernia with most of her stomach in the chest. She has a history of Cameron ulcers and peptic stricture in the past requiring esophageal dilation. Colonoscopy in 2017 with pancolonic diverticulosis and tubular adenomas removed.  Consider next colonoscopy in 3 years (2020).  History of incomplete study of the small bowel in 2015 with evidence of small bowel ulcers.  EGD during admission on 07/19/2019 with mild erosive reflux esophagitis, mild Schatzki ring status post dilation, large hiatal hernia, erythematous gastric mucosa. She is advised to avoid all NSAIDs. Continued PPI twice daily.  Today she states she continues to have some mild dysphagia to crackers.  No issues swallowing her medications or other foods.  No abdominal pain. BM regular. No melena, brbpr.  Since we last saw her she started dialysis.  She has dialysis done in [facility] with [place].  Currently resides at [facility] since January 2021.  May go home in the next few weeks.  Weight has stabilized since dialysis started.  She weighed 208 pounds November 2020, January 2020 down to 173 pounds, February 4 167 pounds, remains stable.  Current Outpatient Medications  Medication Sig Dispense Refill  . albuterol (VENTOLIN HFA) 108 (90 Base) MCG/ACT inhaler Inhale 2 puffs into the lungs every 6 (six) hours as needed for wheezing or shortness of breath.     . allopurinol (ZYLOPRIM) 100 MG tablet Take 1 tablet (100 mg  total) by mouth 2 (two) times daily.    . Amino Acids-Protein Hydrolys (FEEDING SUPPLEMENT, PRO-STAT SUGAR FREE 64,) LIQD Take 30 mLs by mouth in the morning and at bedtime.    Marland Kitchen amiodarone (PACERONE) 200 MG tablet Take 1 tablet (200 mg total) by mouth daily.    Marland Kitchen apixaban (ELIQUIS) 2.5 MG TABS tablet Take 2.5 mg by mouth 2 (two) times daily.    Marland Kitchen aspirin EC 81 MG tablet Take 81 mg by mouth daily.    Marland Kitchen atorvastatin (LIPITOR) 40 MG tablet Take 1 tablet (40 mg total) by mouth at bedtime. 90 tablet 1  . Biotin 5 MG CAPS Take 5 mg by mouth daily.    . Cholecalciferol (VITAMIN D) 50 MCG (2000 UT) tablet Take 2,000 Units by mouth daily.    . Emollient (CETAPHIL) cream Apply 1 application topically 2 (two) times daily. Apply to lower extremities    . famotidine (PEPCID) 40 MG tablet TAKE 1 TABLET AT BEDTIME AS NEEDED TO CONTROL REFLUX (Patient taking differently: Take 40 mg by mouth at bedtime. ) 90 tablet 1  . HYDROcodone-acetaminophen (NORCO) 5-325 MG tablet Take 1 tablet by mouth every 6 (six) hours as needed for moderate pain. 20 tablet 0  . insulin lispro (HUMALOG) 100 UNIT/ML injection Inject 2-9 Units into the skin 3 (three) times daily before meals. If BS is 151-200=2 units, 201-250=3 units, 251-300=5 units, 301-350=7 units, 351-400= 9 units, anything over 400 notify MD    . LANTUS SOLOSTAR 100 UNIT/ML SOPN Inject 8 Units into the skin at  bedtime.     . metoprolol tartrate (LOPRESSOR) 25 MG tablet Take 1 tablet (25 mg total) by mouth 2 (two) times daily. (Patient taking differently: Take 12.5 mg by mouth 2 (two) times daily. )    . omeprazole (PRILOSEC) 20 MG capsule Take 20 mg by mouth 2 (two) times daily.     . potassium chloride SA (KLOR-CON) 10 MEQ tablet Take 1 tablet (10 mEq total) by mouth every other day.    . sertraline (ZOLOFT) 50 MG tablet Take 1 tablet (50 mg total) by mouth daily. 3 tablet 0  . tamsulosin (FLOMAX) 0.4 MG CAPS capsule Take 1 capsule (0.4 mg total) by mouth daily. 30  capsule   . TRELEGY ELLIPTA 100-62.5-25 MCG/INH AEPB Inhale 1 puff into the lungs daily.     No current facility-administered medications for this visit.    Allergies as of 10/22/2019  . (No Known Allergies)   Past Medical History:  Diagnosis Date  . Blood transfusion without reported diagnosis   . CAD (coronary artery disease)    Nonobstructive at cardiac catheterization 2000  . Cataract   . Cervical cancer ([place]) 1978  . CHF (congestive heart failure) ([place])   . Chronic back pain   . Chronic kidney disease    MWF dialysis  . COPD (chronic obstructive pulmonary disease) ([place])   . Degenerative disc disease   . Dysrhythmia    a-fib  . Essential hypertension, benign   . GERD (gastroesophageal reflux disease)   . Gout   . Hiatal hernia 07/27/2013  . History of diverticulitis of colon   . History of hiatal hernia   . Iron deficiency anemia   . Irritable bowel syndrome   . Lumbar radiculopathy   . Mixed hyperlipidemia   . Moderate major depression, single episode ([place]) 07/21/2019  . Neuropathy   . Osteoporosis   . Ovarian cancer [facility]) 1978   patient denies. States this was cervical cancer  . Oxygen deficiency    room air  . Sleep apnea   . Type 2 diabetes mellitus ([place])   . Vitamin B deficiency 12/25/2009  . Vitamin B12 deficiency    Past Surgical History:  Procedure Laterality Date  . ABDOMINAL HYSTERECTOMY    . AV FISTULA PLACEMENT Left 08/05/2019   Procedure: ARTERIOVENOUS (AV) FISTULA CREATION LEFT ARM;  Surgeon: Waynetta Sandy, MD;  Location: [place];  Service: Vascular;  Laterality: Left;  . BACK SURGERY    . BASCILIC VEIN TRANSPOSITION Left 10/05/2019   Procedure: SECOND STAGE LEFT BASCILIC VEIN TRANSPOSITION;  Surgeon: Waynetta Sandy, MD;  Location: [place];  Service: Vascular;  Laterality: Left;  . Benign breast cysts    . CHOLECYSTECTOMY    . COLONOSCOPY  10/01/2006   SLF:Pan colonic diverticulosis and moderate internal hemorrhoids/  Otherwise no polyps, masses, inflammatory changes or AVMs/  . COLONOSCOPY  2011   SLF: pancolonic diverticulosis, large internal hemorrhoids  . COLONOSCOPY N/A 01/26/2016   Procedure: COLONOSCOPY;  Surgeon: [name], MD;  [facility];  Service: Endoscopy;  Laterality: N/A;  830   . ESOPHAGOGASTRODUODENOSCOPY  11/19/2006   SLF: Large hiatal hernia without evidence of Cameron ulcers/. Distal esophageal stricture, which allowed the gastroscope to pass without resistance.  A 16 mm Savary later passed with mild resistance/ Normal stomach.sb bx negative  . ESOPHAGOGASTRODUODENOSCOPY  10/01/2006   WFU:XNATF hiatal hernia.  Distal esophagus without evidence of   erythema, ulceration or Barrett's esophagus  . ESOPHAGOGASTRODUODENOSCOPY  2011  SLF: large hh, distal esophageal web narrowing to 81mm s/p dilation to 73mm  . ESOPHAGOGASTRODUODENOSCOPY N/A 08/06/2013   SLF: 1. Stricture at the gastroesophageal junction 2. large hiatal hernia. 3. Mild erosive gastritis.  Marland Kitchen ESOPHAGOGASTRODUODENOSCOPY (EGD) WITH PROPOFOL N/A 07/19/2019   rourk: Mild erosive reflux esophagitis.  Mild Schatzki ring status post dilation.  Large hiatal hernia with at least one half of the stomach above the diaphragm.  Gastric mucosa erythematous.  Marland Kitchen GIVENS CAPSULE STUDY N/A 08/06/2013   INCOMPLETE-SMALL BOWLE ULCERS  . IR FLUORO GUIDE CV LINE LEFT  07/29/2019  . IR US GUIDE VASC ACCESS LEFT  07/29/2019  . KNEE SURGERY Right   . PARTIAL HYSTERECTOMY  1978  . small bowel capsule  2008   negative  . SPINE SURGERY    . TONSILLECTOMY AND ADENOIDECTOMY    . Two back surgeries/fusion    . UMBILICAL HERNIA REPAIR  2010   Family History  Problem Relation Age of Onset  . Colon cancer Brother        diagnosed age 72. Living.   Marland Kitchen Ulcers Sister   . Diabetes Sister   . Heart attack Sister   . Kidney failure Sister   . Stroke Sister   . Ulcers Mother   . Diabetes Mother   . Heart attack Mother   . Stroke Mother     . Asthma Mother   . Heart disease Mother   . Cervical cancer Mother   . Heart attack Brother   . Heart disease Brother   . Asthma Sister   . Diabetes Brother   . Stroke Maternal Grandmother   . Heart attack Maternal Grandmother   . Heart attack Other   . Early death Father        MVA in his 1s   Social History   Tobacco Use  . Smoking status: Former Smoker    Packs/day: 1.00    Years: 1.00    Pack years: 1.00    Types: Cigarettes    Start date: 02/19/1961    Quit date: 08/05/1961    Years since quitting: 58.2  . Smokeless tobacco: Never Used  . Tobacco comment: Quit smoking x 50 years  Substance Use Topics  . Alcohol use: No  . Drug use: No    ROS:  General: Negative for anorexia, weight loss, fever, chills, fatigue, weakness. ENT: Negative for hoarseness, difficulty swallowing , nasal congestion. CV: Negative for chest pain, angina, palpitations, dyspnea on exertion, peripheral edema.  Respiratory: Negative for dyspnea at rest, dyspnea on exertion, cough, sputum, wheezing.  GI: See history of present illness. GU:  Negative for dysuria, hematuria, urinary incontinence, urinary frequency, nocturnal urination.  Endo: Negative for unusual weight change.    Physical Examination:   BP (!) 101/51   Pulse 65   Temp (!) 95.9 F (35.5 C) (Temporal)   Ht 4\' 11"  (1.499 m)   Wt 165 lb 9.6 oz (75.1 kg)   LMP  (LMP Unknown)   BMI 33.45 kg/m   General: Well-nourished, well-developed in no acute distress.  Eyes: No icterus. Mouth: Oropharyngeal mucosa moist and pink , no lesions erythema or exudate. Lungs: Clear to auscultation bilaterally.  Heart: Regular rate and rhythm, no murmurs rubs or gallops.  Abdomen: Bowel sounds are normal, nontender, nondistended, no hepatosplenomegaly or masses, no abdominal bruits or hernia , no rebound or guarding.   Extremities: No lower extremity edema. No clubbing or deformities. Neuro: Alert and oriented x 4   Skin: Warm  and dry, no  jaundice.   Psych: Alert and cooperative, normal mood and affect.  Labs:  Lab Results  Component Value Date   CREATININE 2.80 (H) 10/05/2019   BUN 9 10/05/2019   NA 140 10/05/2019   K 3.8 10/05/2019   CL 100 10/05/2019   CO2 23 08/11/2019   Lab Results  Component Value Date   WBC 7.2 08/11/2019   HGB 11.9 (L) 10/05/2019   HCT 35.0 (L) 10/05/2019   MCV 94.1 08/11/2019   PLT 327 08/11/2019     Lab Results  Component Value Date   IRON 13 (L) 07/23/2019   TIBC 153 (L) 07/23/2019   FERRITIN 84 07/23/2019   Lab Results  Component Value Date   FOLATE 9.1 08/28/2017   Lab Results  Component Value Date   VITAMINB12 451 08/28/2017     Imaging Studies: No results found.

## 2019-10-22 NOTE — Patient Instructions (Signed)
1. I will review labs from [facility] dialysis. If your anemia is stable, we will continue to monitor. If any trend downward, you may require further work up. I will let you know if additional work up needed.  2. Return to the office in six months for follow up.

## 2019-10-28 ENCOUNTER — Telehealth: Payer: Self-pay | Admitting: Gastroenterology

## 2019-10-28 ENCOUNTER — Encounter: Payer: Self-pay | Admitting: Gastroenterology

## 2019-10-28 NOTE — Telephone Encounter (Signed)
Please let pt know I have reviewed labs from dialysis this month. Hgb 9.9 slightly down from earlier this month.   She is due for surveillance colonoscopy for history of colon polyps.   If she is interested, we can pursue now (would have to hold Eliquis for 48 hours). Let me know is she agrees to colonoscopy.

## 2019-10-28 NOTE — Assessment & Plan Note (Signed)
Symptoms currently well controlled on omeprazole 20 mg twice daily and Pepcid at bedtime.  Continue antireflux measures.  We will continue to monitor symptoms.

## 2019-10-28 NOTE — Progress Notes (Signed)
Labs from March 2021 from dialysis with hemoglobin 9.9.

## 2019-10-28 NOTE — Assessment & Plan Note (Signed)
History of anemia in the setting of end-stage renal disease, Eliquis, large hiatal hernia with history of Cameron ulcers.  B12, folate normal.  Ferritin normal.  October 05, 2019, hemoglobin improved at 11.9.  Last colonoscopy was in 2017, tubular adenomas removed, advised to consider 3-year surveillance colonoscopy.  Patient has had significant weight loss in the setting of hemodialysis.  Her weight has stabilized.  Check recent labs done through dialysis.  We will likely offer her colonoscopy in the near future for history of adenomatous colon polyps.  Further recommendations to follow.

## 2019-10-29 NOTE — Telephone Encounter (Signed)
FYI to [facility]. Will wait for facility to call back.

## 2019-10-29 NOTE — Progress Notes (Signed)
CC'ED TO PCP 

## 2019-10-29 NOTE — Telephone Encounter (Signed)
[facility] and notified them of lab results and I also faxed them. Facility stated they would call to schedule procedure

## 2019-11-01 NOTE — Telephone Encounter (Signed)
Kayla at [facility] called to schedule TCS. [phone]  LSL, please advise orders for TCS.

## 2019-11-02 NOTE — Telephone Encounter (Signed)
[name], call was transferred to [name], no answer.

## 2019-11-02 NOTE — Telephone Encounter (Signed)
Colonoscopy with SLF with propofol.   Hold Eliquis 48 hours before procedure.  Dx: h/o colon polyps, anemia  Day of prep: lantus 4 units at bedtime, continue sliding scale. AM of TCS: hold lantus  Please remind patient that holding Eliquis runs small risk of clot in setting of Afib but will need to be help for colonoscopy/polyp removal as discussed.

## 2019-11-02 NOTE — Telephone Encounter (Signed)
[place], call was transferred to [facility], no answer.

## 2019-11-03 NOTE — Telephone Encounter (Signed)
[place], asked for [place], call was placed on hold. No one picked up after holding several minutes. Called back, no answer after many rings.

## 2019-11-03 NOTE — Telephone Encounter (Signed)
[place], asked Legrand Como to give [name] message to call office to schedule procedure.

## 2019-11-08 NOTE — Telephone Encounter (Signed)
Tried to call [facility] x2, no answer. Letter mailed to facility. Holding spot for TCS 02/10/20.

## 2019-11-09 NOTE — Telephone Encounter (Signed)
Spoke to [place] at [employer], TCS w/Prop w/RMR scheduled for 02/10/20 at 10:45am. Orders entered.

## 2019-11-10 NOTE — Telephone Encounter (Signed)
Endo scheduler advised pt will need to arrive at 7:45 am the morning of procedure for rapid COVID test. Pre-op labs will be drawn morning of procedure d/t being on dialysis. Phone call for pre-op 02/04/20. Rx for prep, TCS instructions, and pre-op letter faxed to [facility] (fax# [phone]).

## 2019-11-12 ENCOUNTER — Other Ambulatory Visit: Payer: Self-pay

## 2019-11-12 ENCOUNTER — Ambulatory Visit (INDEPENDENT_AMBULATORY_CARE_PROVIDER_SITE_OTHER): Payer: Self-pay | Admitting: Physician Assistant

## 2019-11-12 VITALS — BP 97/56 | HR 76 | Resp 16

## 2019-11-12 DIAGNOSIS — N186 End stage renal disease: Secondary | ICD-10-CM

## 2019-11-12 DIAGNOSIS — Z992 Dependence on renal dialysis: Secondary | ICD-10-CM

## 2019-11-12 NOTE — Progress Notes (Signed)
POST OPERATIVE OFFICE NOTE    CC:  F/u for surgery  HPI:  This is a 74 y.o. female who is s/p Left arm second stage basilic vein fistula transposition.  She is here today for f/u exam. HD currently via left IJ TDC.   She remains on Eliquis (apixaban) for hx of atrial fibrillation  She denise pain and loss of motor in the left UE, she does have numbness in the ulnar nerve distribution since her second stage procedure.   No Known Allergies  Current Outpatient Medications  Medication Sig Dispense Refill  . albuterol (VENTOLIN HFA) 108 (90 Base) MCG/ACT inhaler Inhale 2 puffs into the lungs every 6 (six) hours as needed for wheezing or shortness of breath.     . allopurinol (ZYLOPRIM) 100 MG tablet Take 1 tablet (100 mg total) by mouth 2 (two) times daily.    . Amino Acids-Protein Hydrolys (FEEDING SUPPLEMENT, PRO-STAT SUGAR FREE 64,) LIQD Take 30 mLs by mouth in the morning and at bedtime.    Marland Kitchen amiodarone (PACERONE) 200 MG tablet Take 1 tablet (200 mg total) by mouth daily.    Marland Kitchen apixaban (ELIQUIS) 2.5 MG TABS tablet Take 2.5 mg by mouth 2 (two) times daily.    Marland Kitchen aspirin EC 81 MG tablet Take 81 mg by mouth daily.    Marland Kitchen atorvastatin (LIPITOR) 40 MG tablet Take 1 tablet (40 mg total) by mouth at bedtime. 90 tablet 1  . Biotin 5 MG CAPS Take 5 mg by mouth daily.    . Cholecalciferol (VITAMIN D) 50 MCG (2000 UT) tablet Take 2,000 Units by mouth daily.    . Emollient (CETAPHIL) cream Apply 1 application topically 2 (two) times daily. Apply to lower extremities    . famotidine (PEPCID) 40 MG tablet TAKE 1 TABLET AT BEDTIME AS NEEDED TO CONTROL REFLUX (Patient taking differently: Take 40 mg by mouth at bedtime. ) 90 tablet 1  . HYDROcodone-acetaminophen (NORCO) 5-325 MG tablet Take 1 tablet by mouth every 6 (six) hours as needed for moderate pain. 20 tablet 0  . insulin lispro (HUMALOG) 100 UNIT/ML injection Inject 2-9 Units into the skin 3 (three) times daily before meals. If BS is 151-200=2 units,  201-250=3 units, 251-300=5 units, 301-350=7 units, 351-400= 9 units, anything over 400 notify MD    . LANTUS SOLOSTAR 100 UNIT/ML SOPN Inject 8 Units into the skin at bedtime.     . metoprolol tartrate (LOPRESSOR) 25 MG tablet Take 1 tablet (25 mg total) by mouth 2 (two) times daily. (Patient taking differently: Take 12.5 mg by mouth 2 (two) times daily. )    . omeprazole (PRILOSEC) 20 MG capsule Take 20 mg by mouth 2 (two) times daily.     . potassium chloride SA (KLOR-CON) 10 MEQ tablet Take 1 tablet (10 mEq total) by mouth every other day.    . sertraline (ZOLOFT) 50 MG tablet Take 1 tablet (50 mg total) by mouth daily. 3 tablet 0  . tamsulosin (FLOMAX) 0.4 MG CAPS capsule Take 1 capsule (0.4 mg total) by mouth daily. 30 capsule   . TRELEGY ELLIPTA 100-62.5-25 MCG/INH AEPB Inhale 1 puff into the lungs daily.     No current facility-administered medications for this visit.     ROS:  See HPI  Physical Exam:    Incision:  Well healed incisions left UE Extremities:  Grip 5/5, sensation decreased in ulnar nerve distribution  left UE.  Palpable thrill in visible fistula. TDC in place and working well per  patient.  Assessment/Plan:  This is a 74 y.o. female who is s/p: Basilic second stage transposition.  The fistula can be accessed as of May 1 for HD.  Once the fistula is successful the [facility] may be removed which was placed by IR.    Roxy Horseman PA-C [facility] [phone]  Clinic MD:  Donzetta Matters

## 2019-11-17 DIAGNOSIS — E46 Unspecified protein-calorie malnutrition: Secondary | ICD-10-CM | POA: Insufficient documentation

## 2019-11-17 DIAGNOSIS — N2581 Secondary hyperparathyroidism of renal origin: Secondary | ICD-10-CM | POA: Insufficient documentation

## 2019-11-17 DIAGNOSIS — L299 Pruritus, unspecified: Secondary | ICD-10-CM | POA: Insufficient documentation

## 2019-11-17 DIAGNOSIS — R52 Pain, unspecified: Secondary | ICD-10-CM | POA: Insufficient documentation

## 2019-11-17 DIAGNOSIS — D631 Anemia in chronic kidney disease: Secondary | ICD-10-CM | POA: Insufficient documentation

## 2019-11-17 DIAGNOSIS — D689 Coagulation defect, unspecified: Secondary | ICD-10-CM | POA: Insufficient documentation

## 2019-11-21 ENCOUNTER — Emergency Department (HOSPITAL_COMMUNITY): Payer: Medicare Other

## 2019-11-21 ENCOUNTER — Emergency Department (HOSPITAL_COMMUNITY)
Admission: EM | Admit: 2019-11-21 | Discharge: 2019-11-21 | Disposition: A | Discharge status: Home / Self Care | Payer: Medicare Other | Attending: Emergency Medicine | Admitting: Emergency Medicine

## 2019-11-21 ENCOUNTER — Encounter (HOSPITAL_COMMUNITY): Payer: Self-pay | Admitting: Emergency Medicine

## 2019-11-21 ENCOUNTER — Other Ambulatory Visit: Payer: Self-pay

## 2019-11-21 DIAGNOSIS — M546 Pain in thoracic spine: Secondary | ICD-10-CM | POA: Insufficient documentation

## 2019-11-21 DIAGNOSIS — I130 Hypertensive heart and chronic kidney disease with heart failure and stage 1 through stage 4 chronic kidney disease, or unspecified chronic kidney disease: Secondary | ICD-10-CM | POA: Insufficient documentation

## 2019-11-21 DIAGNOSIS — J449 Chronic obstructive pulmonary disease, unspecified: Secondary | ICD-10-CM | POA: Insufficient documentation

## 2019-11-21 DIAGNOSIS — I5033 Acute on chronic diastolic (congestive) heart failure: Secondary | ICD-10-CM | POA: Insufficient documentation

## 2019-11-21 DIAGNOSIS — N183 Chronic kidney disease, stage 3 unspecified: Secondary | ICD-10-CM | POA: Insufficient documentation

## 2019-11-21 DIAGNOSIS — I2510 Atherosclerotic heart disease of native coronary artery without angina pectoris: Secondary | ICD-10-CM | POA: Diagnosis not present

## 2019-11-21 DIAGNOSIS — R52 Pain, unspecified: Secondary | ICD-10-CM

## 2019-11-21 DIAGNOSIS — E1122 Type 2 diabetes mellitus with diabetic chronic kidney disease: Secondary | ICD-10-CM | POA: Diagnosis not present

## 2019-11-21 DIAGNOSIS — Z79899 Other long term (current) drug therapy: Secondary | ICD-10-CM | POA: Insufficient documentation

## 2019-11-21 DIAGNOSIS — M549 Dorsalgia, unspecified: Secondary | ICD-10-CM | POA: Diagnosis present

## 2019-11-21 DIAGNOSIS — Z87891 Personal history of nicotine dependence: Secondary | ICD-10-CM | POA: Insufficient documentation

## 2019-11-21 LAB — CBC WITH DIFFERENTIAL/PLATELET
Abs Immature Granulocytes: 0.09 10*3/uL — ABNORMAL HIGH (ref 0.00–0.07)
Basophils Absolute: 0.1 10*3/uL (ref 0.0–0.1)
Basophils Relative: 1 %
Eosinophils Absolute: 0.4 10*3/uL (ref 0.0–0.5)
Eosinophils Relative: 4 %
HCT: 33.4 % — ABNORMAL LOW (ref 36.0–46.0)
Hemoglobin: 10.4 g/dL — ABNORMAL LOW (ref 12.0–15.0)
Immature Granulocytes: 1 %
Lymphocytes Relative: 12 %
Lymphs Abs: 1.1 10*3/uL (ref 0.7–4.0)
MCH: 29.7 pg (ref 26.0–34.0)
MCHC: 31.1 g/dL (ref 30.0–36.0)
MCV: 95.4 fL (ref 80.0–100.0)
Monocytes Absolute: 0.6 10*3/uL (ref 0.1–1.0)
Monocytes Relative: 6 %
Neutro Abs: 7 10*3/uL (ref 1.7–7.7)
Neutrophils Relative %: 76 %
Platelets: 302 10*3/uL (ref 150–400)
RBC: 3.5 MIL/uL — ABNORMAL LOW (ref 3.87–5.11)
RDW: 16.2 % — ABNORMAL HIGH (ref 11.5–15.5)
WBC: 9.2 10*3/uL (ref 4.0–10.5)
nRBC: 0 % (ref 0.0–0.2)

## 2019-11-21 LAB — COMPREHENSIVE METABOLIC PANEL
ALT: 10 U/L (ref 0–44)
AST: 13 U/L — ABNORMAL LOW (ref 15–41)
Albumin: 2.5 g/dL — ABNORMAL LOW (ref 3.5–5.0)
Alkaline Phosphatase: 72 U/L (ref 38–126)
Anion gap: 11 (ref 5–15)
BUN: 24 mg/dL — ABNORMAL HIGH (ref 8–23)
CO2: 29 mmol/L (ref 22–32)
Calcium: 8.5 mg/dL — ABNORMAL LOW (ref 8.9–10.3)
Chloride: 101 mmol/L (ref 98–111)
Creatinine, Ser: 5.29 mg/dL — ABNORMAL HIGH (ref 0.44–1.00)
GFR calc Af Amer: 9 mL/min — ABNORMAL LOW (ref >60)
GFR calc non Af Amer: 7 mL/min — ABNORMAL LOW (ref >60)
Glucose, Bld: 113 mg/dL — ABNORMAL HIGH (ref 70–99)
Potassium: 5.1 mmol/L (ref 3.5–5.1)
Sodium: 141 mmol/L (ref 135–145)
Total Bilirubin: 0.7 mg/dL (ref 0.3–1.2)
Total Protein: 6 g/dL — ABNORMAL LOW (ref 6.5–8.1)

## 2019-11-21 MED ORDER — METHOCARBAMOL 1000 MG/10ML IJ SOLN
1000.0000 mg | Freq: Once | INTRAMUSCULAR | Status: DC
  Filled 2019-11-21: qty 10

## 2019-11-21 MED ORDER — METHOCARBAMOL 1000 MG/10ML IJ SOLN
500.0000 mg | Freq: Once | INTRAMUSCULAR | Status: AC
  Administered 2019-11-21: 13:00:00 500 mg via INTRAMUSCULAR
  Filled 2019-11-21: qty 5

## 2019-11-21 MED ORDER — LIDOCAINE 5 % EX PTCH: 1.0000 | MEDICATED_PATCH | CUTANEOUS | 0 refills | Status: DC

## 2019-11-21 MED ORDER — LIDOCAINE 5 % EX PTCH
1.0000 | MEDICATED_PATCH | CUTANEOUS | Status: DC
  Administered 2019-11-21: 1 via TRANSDERMAL
  Filled 2019-11-21: qty 1

## 2019-11-21 NOTE — ED Notes (Signed)
Patient transported to X-ray 

## 2019-11-21 NOTE — ED Triage Notes (Signed)
C/o L sided thoracic back pain x 3-4 days that is worse with movement.  Denies known injury.

## 2019-11-21 NOTE — ED Notes (Signed)
This tech asked the pt for a urine sample. Pt stated she could not urinate due to being on dialysis.

## 2019-11-21 NOTE — Discharge Instructions (Addendum)
You were seen in the ER for mid back pain.  Your x-ray did not show any signs of fractures, disc bulges.  All your other lab work, EKG, chest x-ray looked unchanged.  It is important that you make it to dialysis tomorrow.  Please continue taking over-the-counter Tylenol for your pain.  I will prescribe a lidocaine patch to take home. Please apply for 24 hours and then remove for 24 hours.  Please follow-up with your primary care provider within the next 3 days, you may need to start a medication called gabapentin or look into other options for managing your pain. Return to the ER if your symptoms worsen. PLEASE MAKE SURE TO GO TO DIALYSIS AS THIS IS VERY IMPORTANT FOR YOUR KIDNEY FUNCTION

## 2019-11-21 NOTE — ED Provider Notes (Addendum)
[facility] EMERGENCY DEPARTMENT Provider Note   CSN: [id number] Arrival date & time: 11/21/19  1022     History Chief Complaint  Patient presents with  . Back Pain    [name] is a 74 y.o. female.  HPI 74 year old morbidly obese female with extensive medical history including DM type II, ESRD on dialysis , COPD A. fib on Eliquis, degenerative disc disease presents to the ER with a 2-day history of mid thoracic pain.  Patient stated the pain came on fairly gradually, describes it as spasming, and shooting out to her ribs.  She denies any heavy lifting, excessive physical activity.  Pain is worse with movement.  She has tried to take over-the-counter Tylenol and Aleve, and a prescribed "pain medication" that was prescribed to her, with little relief.  She denies any fevers, chills, nausea, vomiting, chest pain, shortness of breath, syncope abdominal pain, groin numbness, bowel bladder incontinence, foot drop, saddle anesthesia, history of shingles.  Past Medical History:  Diagnosis Date  . Blood transfusion without reported diagnosis   . CAD (coronary artery disease)    Nonobstructive at cardiac catheterization 2000  . Cataract   . Cervical cancer ([place]) 1978  . CHF (congestive heart failure) ([place])   . Chronic back pain   . Chronic kidney disease    MWF dialysis  . COPD (chronic obstructive pulmonary disease) ([place])   . Degenerative disc disease   . Dysrhythmia    a-fib  . Essential hypertension, benign   . GERD (gastroesophageal reflux disease)   . Gout   . Hiatal hernia 07/27/2013  . History of diverticulitis of colon   . History of hiatal hernia   . Iron deficiency anemia   . Irritable bowel syndrome   . Lumbar radiculopathy   . Mixed hyperlipidemia   . Moderate major depression, single episode ([place]) 07/21/2019  . Neuropathy   . Osteoporosis   . Ovarian cancer [facility]) 1978   patient denies. States this was cervical cancer  . Oxygen deficiency     room air  . Sleep apnea   . Type 2 diabetes mellitus ([place])   . Vitamin B deficiency 12/25/2009  . Vitamin B12 deficiency     Patient Active Problem List   Diagnosis Date Noted  . Loss of weight 10/22/2019  . Acute renal failure (ARF) ([place]) 07/25/2019  . Goals of care, counseling/discussion 07/23/2019  . Acute renal failure superimposed on stage 4 chronic kidney disease ([place]) 07/22/2019  . 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 07/21/2019  . Paroxysmal atrial fibrillation ([place]) 07/21/2019  . Encephalopathy 07/21/2019  . Moderate major depression, single episode ([place]) 07/21/2019  . Pressure injury of skin 07/20/2019  . GI bleed 07/18/2019  . Acute UTI 07/18/2019  . Coffee ground emesis 07/17/2019  . Anorexia 07/10/2019  . Urinary retention, Female 07/07/2019  . Elevated troponin 07/07/2019  . AKI (acute kidney injury) ([place]) 07/06/2019  . Hypoxia 07/05/2019  . Weight gain with edema 07/02/2019  . 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 CKD stage 3 due to type 2 diabetes mellitus ([place]) 06/22/2019  . Neuropathy due to type 2 diabetes mellitus ([place]) 06/22/2019  . Dyslipidemia associated with type 2 diabetes mellitus ([place]) 06/22/2019  . Chronic gout 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 06/22/2019  . Quality of life palliative care encounter 06/19/2019  . Atrial fibrillation with RVR ([place])   . Atrial flutter with rapid  ventricular response ([place]) 06/16/2019  . Dyspnea and respiratory abnormalities 06/01/2019  . Acute on chronic respiratory failure with hypoxia and hypercapnia with respiratory acidosis 06/01/2019  . Anasarca 05/31/2019  . Nausea without vomiting 05/31/2019  . Generalized abdominal pain 05/31/2019  . History of colonic polyps 05/31/2019  . Lymphedema of both lower extremities 04/28/2019  . Chronic respiratory failure with hypoxia ([place]) 07/13/2018  . Acute renal  failure superimposed on stage 3 chronic kidney disease ([place]) 07/13/2018  . Chronic diastolic CHF (congestive heart failure) ([place]) 07/13/2018  . COPD (chronic obstructive pulmonary disease) ([place]) 07/13/2018  . COPD exacerbation ([place]) 07/12/2018  . Flatulence 09/18/2017  . IBS (irritable bowel syndrome) 04/03/2017  . Gout 03/15/2017  . Acute on chronic diastolic CHF (congestive heart failure) ([place]) 05/08/2016  . CKD (chronic kidney disease) stage 3, GFR 30-59 ml/min 05/08/2016  . COPD with acute exacerbation ([place]) 05/08/2016  . Constipation 04/25/2016  . Peripheral vertigo 06/27/2015  . Port-A-Cath in place 10/06/2014  . Chronic obstructive pulmonary disease ([place]) 04/30/2014  . Obesity, Class III, BMI 40-49.9 (morbid obesity) ([place]) 04/30/2014  . Hemorrhoids, internal, with bleeding 11/10/2013  . Anemia 08/18/2013  . Hiatal hernia with GERD and esophagitis   . Obstructive chronic bronchitis without exacerbation ([place]) 12/26/2009  . Dysphagia 12/26/2009  . DM type 2 with diabetic peripheral neuropathy ([place]) 12/25/2009  . Vitamin B deficiency 12/25/2009  . Iron deficiency anemia due to chronic blood loss 12/25/2009  . Benign essential HTN 12/25/2009  . DEGENERATIVE DISC DISEASE 12/25/2009  . OSA on CPAP 12/25/2009  . HLD (hyperlipidemia) 12/25/2009  . Type 2 diabetes with nephropathy ([place]) 12/25/2009    Past Surgical History:  Procedure Laterality Date  . ABDOMINAL HYSTERECTOMY    . AV FISTULA PLACEMENT Left 08/05/2019   Procedure: ARTERIOVENOUS (AV) FISTULA CREATION LEFT ARM;  Surgeon: Waynetta Sandy, MD;  Location: [facility];  Service: Vascular;  Laterality: Left;  . BACK SURGERY    . BASCILIC VEIN TRANSPOSITION Left 10/05/2019   Procedure: SECOND STAGE LEFT BASCILIC VEIN TRANSPOSITION;  Surgeon: Waynetta Sandy, MD;  Location: [facility];  Service: Vascular;  Laterality: Left;  . Benign breast cysts    . CHOLECYSTECTOMY    . COLONOSCOPY  10/01/2006   SLF:Pan colonic  diverticulosis and moderate internal hemorrhoids/ Otherwise no polyps, masses, inflammatory changes or AVMs/  . COLONOSCOPY  2011   SLF: pancolonic diverticulosis, large internal hemorrhoids  . COLONOSCOPY N/A 01/26/2016   Procedure: COLONOSCOPY;  Surgeon: [name], MD;  [facility];  Service: Endoscopy;  Laterality: N/A;  830   . ESOPHAGOGASTRODUODENOSCOPY  11/19/2006   SLF: Large hiatal hernia without evidence of Cameron ulcers/. Distal esophageal stricture, which allowed the gastroscope to pass without resistance.  A 16 mm Savary later passed with mild resistance/ Normal stomach.sb bx negative  . ESOPHAGOGASTRODUODENOSCOPY  10/01/2006   HAL:PFXTK hiatal hernia.  Distal esophagus without evidence of   erythema, ulceration or Barrett's esophagus  . ESOPHAGOGASTRODUODENOSCOPY  2011   SLF: large hh, distal esophageal web narrowing to 72mm s/p dilation to 42mm  . ESOPHAGOGASTRODUODENOSCOPY N/A 08/06/2013   SLF: 1. Stricture at the gastroesophageal junction 2. large hiatal hernia. 3. Mild erosive gastritis.  Marland Kitchen ESOPHAGOGASTRODUODENOSCOPY (EGD) WITH PROPOFOL N/A 07/19/2019   rourk: Mild erosive reflux esophagitis.  Mild Schatzki ring status post dilation.  Large hiatal hernia with at least one half of the stomach above the diaphragm.  Gastric mucosa erythematous.  Marland Kitchen GIVENS CAPSULE STUDY N/A 08/06/2013   INCOMPLETE-SMALL  BOWLE ULCERS  . IR FLUORO GUIDE CV LINE LEFT  07/29/2019  . IR US GUIDE VASC ACCESS LEFT  07/29/2019  . KNEE SURGERY Right   . PARTIAL HYSTERECTOMY  1978  . small bowel capsule  2008   negative  . SPINE SURGERY    . TONSILLECTOMY AND ADENOIDECTOMY    . Two back surgeries/fusion    . UMBILICAL HERNIA REPAIR  2010     OB History   No obstetric history on file.     Family History  Problem Relation Age of Onset  . Colon cancer Brother        diagnosed age 75. Living.   Marland Kitchen Ulcers Sister   . Diabetes Sister   . Heart attack Sister   . Kidney failure Sister     . Stroke Sister   . Ulcers Mother   . Diabetes Mother   . Heart attack Mother   . Stroke Mother   . Asthma Mother   . Heart disease Mother   . Cervical cancer Mother   . Heart attack Brother   . Heart disease Brother   . Asthma Sister   . Diabetes Brother   . Stroke Maternal Grandmother   . Heart attack Maternal Grandmother   . Heart attack Other   . Early death Father        MVA in his 49s    Social History   Tobacco Use  . Smoking status: Former Smoker    Packs/day: 1.00    Years: 1.00    Pack years: 1.00    Types: Cigarettes    Start date: 02/19/1961    Quit date: 08/05/1961    Years since quitting: 58.3  . Smokeless tobacco: Never Used  . Tobacco comment: Quit smoking x 50 years  Substance Use Topics  . Alcohol use: No  . Drug use: No    Home Medications Prior to Admission medications   Medication Sig Start Date End Date Taking? Authorizing Provider  albuterol (VENTOLIN HFA) 108 (90 Base) MCG/ACT inhaler Inhale 2 puffs into the lungs every 6 (six) hours as needed for wheezing or shortness of breath.  04/09/19   [provider]  allopurinol (ZYLOPRIM) 100 MG tablet Take 1 tablet (100 mg total) by mouth 2 (two) times daily. 08/09/19   Nita Sells, MD  Amino Acids-Protein Hydrolys (FEEDING SUPPLEMENT, PRO-STAT SUGAR FREE 64,) LIQD Take 30 mLs by mouth in the morning and at bedtime.    [provider]  amiodarone (PACERONE) 200 MG tablet Take 1 tablet (200 mg total) by mouth daily. 08/10/19   Nita Sells, MD  apixaban (ELIQUIS) 2.5 MG TABS tablet Take 2.5 mg by mouth 2 (two) times daily.    [provider]  aspirin EC 81 MG tablet Take 81 mg by mouth daily.    [provider]  atorvastatin (LIPITOR) 40 MG tablet Take 1 tablet (40 mg total) by mouth at bedtime. 05/04/18   Satira Sark, MD  Biotin 5 MG CAPS Take 5 mg by mouth daily.    [provider]  Cholecalciferol (VITAMIN D) 50 MCG (2000 UT) tablet Take  2,000 Units by mouth daily.    [provider]  Emollient (CETAPHIL) cream Apply 1 application topically 2 (two) times daily. Apply to lower extremities    [provider]  famotidine (PEPCID) 40 MG tablet TAKE 1 TABLET AT BEDTIME AS NEEDED TO CONTROL REFLUX Patient taking differently: Take 40 mg by mouth at bedtime.  06/23/15  Mannam, Praveen, MD  HYDROcodone-acetaminophen (NORCO) 5-325 MG tablet Take 1 tablet by mouth every 6 (six) hours as needed for moderate pain. 10/05/19   Dagoberto Ligas, PA-C  insulin lispro (HUMALOG) 100 UNIT/ML injection Inject 2-9 Units into the skin 3 (three) times daily before meals. If BS is 151-200=2 units, 201-250=3 units, 251-300=5 units, 301-350=7 units, 351-400= 9 units, anything over 400 notify MD    [provider]  LANTUS SOLOSTAR 100 UNIT/ML SOPN Inject 8 Units into the skin at bedtime.  10/06/12   [provider]  lidocaine (LIDODERM) 5 % Place 1 patch onto the skin daily. Remove & Discard patch within 12 hours or as directed by MD 11/21/19   Garald Balding, PA-C  metoprolol tartrate (LOPRESSOR) 25 MG tablet Take 1 tablet (25 mg total) by mouth 2 (two) times daily. Patient taking differently: Take 12.5 mg by mouth 2 (two) times daily.  08/09/19   Nita Sells, MD  omeprazole (PRILOSEC) 20 MG capsule Take 20 mg by mouth 2 (two) times daily.     [provider]  potassium chloride SA (KLOR-CON) 10 MEQ tablet Take 1 tablet (10 mEq total) by mouth every other day. 07/08/19   Johnson, Clanford L, MD  sertraline (ZOLOFT) 50 MG tablet Take 1 tablet (50 mg total) by mouth daily. 08/09/19   Nita Sells, MD  tamsulosin (FLOMAX) 0.4 MG CAPS capsule Take 1 capsule (0.4 mg total) by mouth daily. 07/08/19   Johnson, Clanford L, MD  TRELEGY ELLIPTA 100-62.5-25 MCG/INH AEPB Inhale 1 puff into the lungs daily. 05/13/19   [provider]    Allergies    Patient has no known allergies.  Review of Systems   Review  of Systems  Constitutional: Negative for chills, fatigue and fever.  HENT: Negative for ear pain and sore throat.   Respiratory: Negative for cough and shortness of breath.   Cardiovascular: Negative for chest pain and palpitations.  Gastrointestinal: Negative for abdominal pain, constipation, diarrhea, nausea, rectal pain and vomiting.  Genitourinary: Negative for dysuria and hematuria.  Musculoskeletal: Positive for back pain and gait problem. Negative for arthralgias, myalgias, neck pain and neck stiffness.  Skin: Negative for color change and rash.  Neurological: Negative for seizures and syncope.  Psychiatric/Behavioral: Negative for confusion.  All other systems reviewed and are negative.   Physical Exam Updated Vital Signs BP 132/62   Pulse 84   Temp 98.3 F (36.8 C) (Oral)   Resp 19   Ht 4\' 8"  (1.422 m)   Wt 75 kg   LMP  (LMP Unknown)   SpO2 98%   BMI 37.07 kg/m   Physical Exam Vitals reviewed.  Constitutional:      Appearance: Normal appearance.  HENT:     Head: Normocephalic and atraumatic.     Mouth/Throat:     Mouth: Mucous membranes are moist.  Eyes:     General:        Right eye: No discharge.        Left eye: No discharge.     Extraocular Movements: Extraocular movements intact.     Conjunctiva/sclera: Conjunctivae normal.     Pupils: Pupils are equal, round, and reactive to light.  Cardiovascular:     Rate and Rhythm: Normal rate and regular rhythm.     Pulses: Normal pulses.     Heart sounds: Normal heart sounds.  Musculoskeletal:        General: No swelling. Normal range of motion.     Cervical back:  Normal range of motion and neck supple.     Comments: 3 cm x 3 cm small mass noted over left scapula.  Nontender, mobile.  Moderate tenderness to palpation over T-spine paraspinal muscles approximate level of T6-7.  Tenderness to palpation out to ribs.  No noticeable rashes, bruising, hematoma, erythema, swelling, fluctuance, step-offs.  No midline  tenderness.  5-5 strength in upper and lower extremities.  Sensations intact.  2+ pedal pulses.  Fistula noted in left bicep.  No signs of swelling, warmth, erythema, drainage.  Neurological:     General: No focal deficit present.     Mental Status: She is alert and oriented to person, place, and time.  Psychiatric:        Mood and Affect: Mood normal.        Behavior: Behavior normal.     ED Results / Procedures / Treatments   Labs (all labs ordered are listed, but only abnormal results are displayed) Labs Reviewed  CBC WITH DIFFERENTIAL/PLATELET - Abnormal; Notable for the following components:      Result Value   RBC 3.50 (*)    Hemoglobin 10.4 (*)    HCT 33.4 (*)    RDW 16.2 (*)    Abs Immature Granulocytes 0.09 (*)    All other components within normal limits  COMPREHENSIVE METABOLIC PANEL - Abnormal; Notable for the following components:   Glucose, Bld 113 (*)    BUN 24 (*)    Creatinine, Ser 5.29 (*)    Calcium 8.5 (*)    Total Protein 6.0 (*)    Albumin 2.5 (*)    AST 13 (*)    GFR calc non Af Amer 7 (*)    GFR calc Af Amer 9 (*)    All other components within normal limits    EKG EKG Interpretation  Date/Time:  Sunday November 21 2019 10:32:12 EDT Ventricular Rate:  80 PR Interval:  166 QRS Duration: 98 QT Interval:  412 QTC Calculation: 475 R Axis:   4 Text Interpretation: Normal sinus rhythm Low voltage QRS Incomplete left bundle branch block No significant change since last tracing Abnormal ECG Confirmed by Quintella Reichert [phone]) on 11/21/2019 12:27:56 PM   Radiology DG Chest 1 View  Result Date: 11/21/2019 CLINICAL DATA:  Left thoracic back pain for 4 days, worse with movement EXAM: CHEST  1 VIEW COMPARISON:  07/25/2019 chest radiograph. FINDINGS: Right internal jugular Port-A-Cath terminates in the middle third of the SVC. Left internal jugular central venous catheter terminates in the lower third of the SVC. Stable cardiomediastinal silhouette with  top-normal heart size and large hiatal hernia (as seen on 07/17/2019 CT abdomen study). No pneumothorax. No pleural effusion. Cephalization of the pulmonary vasculature without overt pulmonary edema. Mild bibasilar atelectasis. IMPRESSION: 1. Mild bibasilar atelectasis. 2. Stable large hiatal hernia. 3. Cephalization of the pulmonary vasculature without overt pulmonary edema. Electronically Signed   By: Ilona Sorrel M.D.   On: 11/21/2019 13:57   DG Thoracic Spine 2 View  Result Date: 11/21/2019 CLINICAL DATA:  Left thoracic back pain for 4 days, worse with movement. No reported injury. EXAM: THORACIC SPINE 2 VIEWS COMPARISON:  01/01/2013 thoracic spine radiographs FINDINGS: Preserved thoracic vertebral body heights with no thoracic spine fracture or subluxation. No suspicious focal osseous lesions. Mild multilevel thoracic spondylosis. Partially visualized bilateral internal jugular central venous catheters. IMPRESSION: No thoracic spine fracture or subluxation. Mild multilevel thoracic spondylosis. Electronically Signed   By: Ilona Sorrel M.D.   On: 11/21/2019 13:55  Procedures Procedures (including critical care time)  Medications Ordered in ED Medications  lidocaine (LIDODERM) 5 % 1 patch (1 patch Transdermal Patch Applied 11/21/19 1425)  methocarbamol (ROBAXIN) injection 500 mg (500 mg Intramuscular Given 11/21/19 1315)    ED Course  I have reviewed the triage vital signs and the nursing notes.  Pertinent labs & imaging results that were available during my care of the patient were reviewed by me and considered in my medical decision making (see chart for details).    MDM Rules/Calculators/A&P                      74 year old female with extensive medical history presents with 2-day left thoracic pain. Chronically ill-appearing, no acute distress.  Physical exam positive for left paraspinal tenderness radiating out into the rib cage.  Patient denies any injury, falls.  Suspicion for  fracture is low.  Did notice a small mass over scapula, suspect this is a lipoma.  Thoracic x-rays negative for fracture, noted mild spondylosis.  Given patient's extensive history of visiting the ER and being admitted, ordered basic labs which showed no significant electrolyte abnormalities, elevated creatinine but patient is on dialysis.  CBC without leukocytosis, did show mild anemia which appears to be at baseline.  EKG unchanged from previous.  Chest x-ray negative for acute cardiopulmonary pathology.  Vitals, PE, general work-up reassuring.  The emergent differential diagnosis for back pain includes but is not limited to fracture, muscle strain, cauda equina, spinal stenosis. DDD, ankylosing spondylitis, acute ligamentous injury, disk herniation, spondylolisthesis, Epidural compression syndrome, metastatic cancer, transverse myelitis, vertebral osteomyelitis, diskitis, kidney stone, pyelonephritis, AAA, Perforated ulcer, Retrocecal appendicitis, pancreatitis, bowel obstruction, retroperitoneal hemorrhage or mass, meningitis.  No neurological deficits and normal neuro exam.   No loss of bowel or bladder control.  No concern for cauda equina.  No fever, night sweats, weight loss, h/o cancer, IVDU.  No abdominal pain/flank pain.  No recent falls/injuries, suspicion for hemorrhage low.  No visible rashes on PE, though given the distribution of her pain, could suggest early shingles.  I discussed this with the patient, encouraged her to keep a close eye for development of rashes.  Patient's pain did appear to have a possible radicular pattern to it, but I am hesitant to start her on gabapentin today given side effect of dizziness and unclear response to new medication.  Hesitant to give her Robaxin as this is sedating, and could cause a fall.  DC with additional Lidoderm patch, discussed with patient and son, and she will continue to take over the counter Tylenol.  Informed her that she should not take Aleve as  previously mentioned as this is bad for her kidneys.  Also dissuaded taking old hydrocodone as mentioned as this can cause constipation and may not actually treat her pain.  Discussed heat/ice, passive stretching.  Will defer to PCP for further medication management, urged follow-up within the next 3 days.  Stressed the importance of patient going to next dialysis session despite her back pain.  At this stage in the ED course, the patient has been adequately screened for any life-threatening causes of her back pain.  Strict return precautions given.  Patient is overall relieved by the work-up, voices understanding is agreeable to this plan.   Patient was seen and evaluated by Dr. Ayesha Rumpf and she is agreeable to the above plan. Final Clinical Impression(s) / ED Diagnoses Final diagnoses:  Acute left-sided thoracic back pain    Rx /  DC Orders ED Discharge Orders         Ordered    lidocaine (LIDODERM) 5 %  Every 24 hours,   Status:  Discontinued     11/21/19 1510    lidocaine (LIDODERM) 5 %  Every 24 hours     11/21/19 [place], Levoy Geisen A, PA-C 11/21/19 1546    Garald Balding, PA-C 11/21/19 1547    Quintella Reichert, MD 11/22/19 1421

## 2019-11-21 NOTE — ED Notes (Signed)
Instructed pt to leave Lidocaine patch on 24 hours and off 24 hours per [place] PA.  Pt verbalized understanding

## 2019-11-26 ENCOUNTER — Ambulatory Visit: Payer: Medicare Other | Admitting: Cardiology

## 2020-01-27 ENCOUNTER — Telehealth: Payer: Self-pay

## 2020-01-27 NOTE — Telephone Encounter (Signed)
Talked to [facility] regarding pt having cannulation issues. She is going to try to call [facility] vascular or IR.

## 2020-02-02 ENCOUNTER — Telehealth: Payer: Self-pay

## 2020-02-02 NOTE — Telephone Encounter (Signed)
Pt has TCS on 02/10/20. Pt has dialysis Monday, Wednesday and Fridays. Pt doesn't get home until 4:00PM on the days she has dialysis. Pt needs clarity on how to complete her prep. The times listed for pt to start, interfere with her dialysis.

## 2020-02-03 ENCOUNTER — Other Ambulatory Visit: Payer: Self-pay

## 2020-02-03 MED ORDER — PEG 3350-KCL-NA BICARB-NACL 420 G PO SOLR: 4000.0000 mL | ORAL | 0 refills | Status: DC

## 2020-02-03 NOTE — Telephone Encounter (Signed)
Patient was at [facility] when TCS was scheduled. Instructions were given for prep to all be completed day prior to procedure d/t nursing home patients arrive early day of procedure for rapid covid test.  Called pt, she is no longer at [facility]. She is home now. Advised her she will need new instructions for split prep and will need covid test prior to day of procedure. Has Dialysis M, W, F 11:30am-4:00pm. She can take Dulcolax tablets at dialysis or as soon as she gets home and prep would be started 2 hours later. Pt can come by office 02/08/20 to pick up new instructions. [facility] didn't give pt previous instructions and she wasn't aware of TCS being scheduled until hospital called her.  Informed endo scheduler pt in no longer at facility. Pt to receive pre-op phone call tomorrow (endo scheduler made note that pt leaves for dialysis at 11:00am tomorrow). Covid test scheduled for 02/08/20 at 12:30pm.   Called pt back, informed her of pre-op phone call and covid test appt. Rx for prep sent to [facility] in [place]. New instructions placed at front desk.

## 2020-02-04 ENCOUNTER — Encounter (HOSPITAL_COMMUNITY)
Admission: RE | Admit: 2020-02-04 | Discharge: 2020-02-04 | Disposition: A | Discharge status: Home / Self Care | Payer: Medicare Other | Source: Ambulatory Visit | Attending: Internal Medicine | Admitting: Internal Medicine

## 2020-02-04 ENCOUNTER — Other Ambulatory Visit: Payer: Self-pay

## 2020-02-04 NOTE — Patient Instructions (Signed)
Your procedure is scheduled on: 02/10/2020              Report to [facility] at 7:45    AM.  Call this number if you have problems the morning of surgery: [phone]   Remember:              Follow Directions on the letter you received from Your Physician's office regarding the Bowel Prep              No Smoking the day of Procedure :   Take these medicines the morning of surgery with A SIP OF WATER: Amiodarone, Nexium, Zoloft and gabapentin   Do not wear jewelry, make-up or nail polish.    Do not bring valuables to the hospital.  Contacts, dentures or bridgework may not be worn into surgery.  .   Patients discharged the day of surgery will not be allowed to drive home.     Colonoscopy, Adult, Care After This sheet gives you information about how to care for yourself after your procedure. Your health care provider may also give you more specific instructions. If you have problems or questions, contact your health care provider. What can I expect after the procedure? After the procedure, it is common to have:  A small amount of blood in your stool for 24 hours after the procedure.  Some gas.  Mild abdominal cramping or bloating.  Follow these instructions at home: General instructions   For the first 24 hours after the procedure: ? Do not drive or use machinery. ? Do not sign important documents. ? Do not drink alcohol. ? Do your regular daily activities at a slower pace than normal. ? Eat soft, easy-to-digest foods. ? Rest often.  Take over-the-counter or prescription medicines only as told by your health care provider.  It is up to you to get the results of your procedure. Ask your health care provider, or the department performing the procedure, when your results will be ready. Relieving cramping and bloating  Try walking around when you have cramps or feel bloated.  Apply heat to your abdomen as told by your health care provider. Use a heat source that your  health care provider recommends, such as a moist heat pack or a heating pad. ? Place a towel between your skin and the heat source. ? Leave the heat on for 20-30 minutes. ? Remove the heat if your skin turns bright red. This is especially important if you are unable to feel pain, heat, or cold. You may have a greater risk of getting burned. Eating and drinking  Drink enough fluid to keep your urine clear or pale yellow.  Resume your normal diet as instructed by your health care provider. Avoid heavy or fried foods that are hard to digest.  Avoid drinking alcohol for as long as instructed by your health care provider. Contact a health care provider if:  You have blood in your stool 2-3 days after the procedure. Get help right away if:  You have more than a small spotting of blood in your stool.  You pass large blood clots in your stool.  Your abdomen is swollen.  You have nausea or vomiting.  You have a fever.  You have increasing abdominal pain that is not relieved with medicine. This information is not intended to replace advice given to you by your health care provider. Make sure you discuss any questions you have with your health care provider. Document Released:  03/05/2004 Document Revised: 04/15/2016 Document Reviewed: 10/03/2015 Elsevier Interactive Patient Education  [employer].

## 2020-02-08 ENCOUNTER — Other Ambulatory Visit (HOSPITAL_COMMUNITY): Admission: RE | Admit: 2020-02-08 | Payer: No Typology Code available for payment source | Source: Ambulatory Visit

## 2020-02-10 ENCOUNTER — Encounter (HOSPITAL_COMMUNITY): Payer: Self-pay | Admitting: Internal Medicine

## 2020-02-10 ENCOUNTER — Ambulatory Visit (HOSPITAL_COMMUNITY): Payer: Medicare Other | Admitting: Anesthesiology

## 2020-02-10 ENCOUNTER — Encounter (HOSPITAL_COMMUNITY)
Admission: RE | Disposition: A | Discharge status: Home / Self Care | Payer: Self-pay | Source: Home / Self Care | Attending: Internal Medicine

## 2020-02-10 ENCOUNTER — Other Ambulatory Visit: Payer: Self-pay

## 2020-02-10 ENCOUNTER — Ambulatory Visit (HOSPITAL_COMMUNITY)
Admission: RE | Admit: 2020-02-10 | Discharge: 2020-02-10 | Disposition: A | Discharge status: Home / Self Care | Payer: Medicare Other | Attending: Internal Medicine | Admitting: Internal Medicine

## 2020-02-10 DIAGNOSIS — J449 Chronic obstructive pulmonary disease, unspecified: Secondary | ICD-10-CM | POA: Insufficient documentation

## 2020-02-10 DIAGNOSIS — Q438 Other specified congenital malformations of intestine: Secondary | ICD-10-CM | POA: Insufficient documentation

## 2020-02-10 DIAGNOSIS — Z8541 Personal history of malignant neoplasm of cervix uteri: Secondary | ICD-10-CM | POA: Diagnosis not present

## 2020-02-10 DIAGNOSIS — Z7982 Long term (current) use of aspirin: Secondary | ICD-10-CM | POA: Diagnosis not present

## 2020-02-10 DIAGNOSIS — Z992 Dependence on renal dialysis: Secondary | ICD-10-CM | POA: Diagnosis not present

## 2020-02-10 DIAGNOSIS — Z7901 Long term (current) use of anticoagulants: Secondary | ICD-10-CM | POA: Insufficient documentation

## 2020-02-10 DIAGNOSIS — E1140 Type 2 diabetes mellitus with diabetic neuropathy, unspecified: Secondary | ICD-10-CM | POA: Insufficient documentation

## 2020-02-10 DIAGNOSIS — E782 Mixed hyperlipidemia: Secondary | ICD-10-CM | POA: Insufficient documentation

## 2020-02-10 DIAGNOSIS — M109 Gout, unspecified: Secondary | ICD-10-CM | POA: Insufficient documentation

## 2020-02-10 DIAGNOSIS — I130 Hypertensive heart and chronic kidney disease with heart failure and stage 1 through stage 4 chronic kidney disease, or unspecified chronic kidney disease: Secondary | ICD-10-CM | POA: Diagnosis not present

## 2020-02-10 DIAGNOSIS — Z794 Long term (current) use of insulin: Secondary | ICD-10-CM | POA: Diagnosis not present

## 2020-02-10 DIAGNOSIS — I2510 Atherosclerotic heart disease of native coronary artery without angina pectoris: Secondary | ICD-10-CM | POA: Diagnosis not present

## 2020-02-10 DIAGNOSIS — Z79899 Other long term (current) drug therapy: Secondary | ICD-10-CM | POA: Diagnosis not present

## 2020-02-10 DIAGNOSIS — G4730 Sleep apnea, unspecified: Secondary | ICD-10-CM | POA: Diagnosis not present

## 2020-02-10 DIAGNOSIS — E1122 Type 2 diabetes mellitus with diabetic chronic kidney disease: Secondary | ICD-10-CM | POA: Insufficient documentation

## 2020-02-10 DIAGNOSIS — Z8719 Personal history of other diseases of the digestive system: Secondary | ICD-10-CM | POA: Insufficient documentation

## 2020-02-10 DIAGNOSIS — Z1211 Encounter for screening for malignant neoplasm of colon: Secondary | ICD-10-CM | POA: Diagnosis present

## 2020-02-10 DIAGNOSIS — F329 Major depressive disorder, single episode, unspecified: Secondary | ICD-10-CM | POA: Diagnosis not present

## 2020-02-10 DIAGNOSIS — I4891 Unspecified atrial fibrillation: Secondary | ICD-10-CM | POA: Insufficient documentation

## 2020-02-10 DIAGNOSIS — Z86010 Personal history of colonic polyps: Secondary | ICD-10-CM | POA: Diagnosis not present

## 2020-02-10 DIAGNOSIS — N189 Chronic kidney disease, unspecified: Secondary | ICD-10-CM | POA: Insufficient documentation

## 2020-02-10 DIAGNOSIS — Z87891 Personal history of nicotine dependence: Secondary | ICD-10-CM | POA: Diagnosis not present

## 2020-02-10 DIAGNOSIS — Z20822 Contact with and (suspected) exposure to covid-19: Secondary | ICD-10-CM | POA: Diagnosis not present

## 2020-02-10 DIAGNOSIS — K5730 Diverticulosis of large intestine without perforation or abscess without bleeding: Secondary | ICD-10-CM | POA: Insufficient documentation

## 2020-02-10 DIAGNOSIS — Z9981 Dependence on supplemental oxygen: Secondary | ICD-10-CM | POA: Insufficient documentation

## 2020-02-10 HISTORY — PX: COLONOSCOPY WITH PROPOFOL: SHX5780

## 2020-02-10 LAB — CBC WITH DIFFERENTIAL/PLATELET
Abs Immature Granulocytes: 0.04 10*3/uL (ref 0.00–0.07)
Basophils Absolute: 0.1 10*3/uL (ref 0.0–0.1)
Basophils Relative: 1 %
Eosinophils Absolute: 0.2 10*3/uL (ref 0.0–0.5)
Eosinophils Relative: 3 %
HCT: 37.4 % (ref 36.0–46.0)
Hemoglobin: 11.7 g/dL — ABNORMAL LOW (ref 12.0–15.0)
Immature Granulocytes: 1 %
Lymphocytes Relative: 19 %
Lymphs Abs: 1.1 10*3/uL (ref 0.7–4.0)
MCH: 30.2 pg (ref 26.0–34.0)
MCHC: 31.3 g/dL (ref 30.0–36.0)
MCV: 96.6 fL (ref 80.0–100.0)
Monocytes Absolute: 0.4 10*3/uL (ref 0.1–1.0)
Monocytes Relative: 7 %
Neutro Abs: 4.2 10*3/uL (ref 1.7–7.7)
Neutrophils Relative %: 69 %
Platelets: 173 10*3/uL (ref 150–400)
RBC: 3.87 MIL/uL (ref 3.87–5.11)
RDW: 16.3 % — ABNORMAL HIGH (ref 11.5–15.5)
WBC: 6 10*3/uL (ref 4.0–10.5)
nRBC: 0 % (ref 0.0–0.2)

## 2020-02-10 LAB — COMPREHENSIVE METABOLIC PANEL
ALT: 14 U/L (ref 0–44)
AST: 22 U/L (ref 15–41)
Albumin: 3.8 g/dL (ref 3.5–5.0)
Alkaline Phosphatase: 87 U/L (ref 38–126)
Anion gap: 15 (ref 5–15)
BUN: 15 mg/dL (ref 8–23)
CO2: 26 mmol/L (ref 22–32)
Calcium: 9.3 mg/dL (ref 8.9–10.3)
Chloride: 93 mmol/L — ABNORMAL LOW (ref 98–111)
Creatinine, Ser: 4.3 mg/dL — ABNORMAL HIGH (ref 0.44–1.00)
GFR calc Af Amer: 11 mL/min — ABNORMAL LOW (ref >60)
GFR calc non Af Amer: 10 mL/min — ABNORMAL LOW (ref >60)
Glucose, Bld: 96 mg/dL (ref 70–99)
Potassium: 4.8 mmol/L (ref 3.5–5.1)
Sodium: 134 mmol/L — ABNORMAL LOW (ref 135–145)
Total Bilirubin: 1 mg/dL (ref 0.3–1.2)
Total Protein: 7.8 g/dL (ref 6.5–8.1)

## 2020-02-10 LAB — PROTIME-INR
INR: 1 (ref 0.8–1.2)
Prothrombin Time: 13.1 seconds (ref 11.4–15.2)

## 2020-02-10 LAB — GLUCOSE, CAPILLARY
Glucose-Capillary: 98 mg/dL (ref 70–99)
Glucose-Capillary: 85 mg/dL (ref 70–99)

## 2020-02-10 LAB — SARS CORONAVIRUS 2 BY RT PCR (HOSPITAL ORDER, PERFORMED IN ~~LOC~~ HOSPITAL LAB): SARS Coronavirus 2: NEGATIVE

## 2020-02-10 SURGERY — COLONOSCOPY WITH PROPOFOL
Anesthesia: General

## 2020-02-10 MED ORDER — SODIUM CHLORIDE 0.9 % IV SOLN
Freq: Once | INTRAVENOUS | Status: AC
  Administered 2020-02-10: 500 mL via INTRAVENOUS

## 2020-02-10 MED ORDER — CHLORHEXIDINE GLUCONATE CLOTH 2 % EX PADS: 6.0000 | MEDICATED_PAD | Freq: Once | CUTANEOUS | Status: DC

## 2020-02-10 MED ORDER — PHENYLEPHRINE HCL (PRESSORS) 10 MG/ML IV SOLN
INTRAVENOUS | Status: DC | PRN
  Administered 2020-02-10 (×2): 100 ug via INTRAVENOUS

## 2020-02-10 MED ORDER — LACTATED RINGERS IV SOLN: Freq: Once | INTRAVENOUS | Status: DC

## 2020-02-10 MED ORDER — SODIUM CHLORIDE 0.9 % IV SOLN
INTRAVENOUS | Status: DC | PRN
Start: 2020-02-10 — End: 2020-02-10

## 2020-02-10 MED ORDER — EPHEDRINE SULFATE 50 MG/ML IJ SOLN
INTRAMUSCULAR | Status: DC | PRN
  Administered 2020-02-10: 10 mg via INTRAVENOUS

## 2020-02-10 MED ORDER — LIDOCAINE HCL (CARDIAC) PF 50 MG/5ML IV SOSY
PREFILLED_SYRINGE | INTRAVENOUS | Status: DC | PRN
  Administered 2020-02-10: 100 mg via INTRAVENOUS

## 2020-02-10 MED ORDER — PROPOFOL 500 MG/50ML IV EMUL
INTRAVENOUS | Status: DC | PRN
  Administered 2020-02-10: 125 ug/kg/min via INTRAVENOUS
  Administered 2020-02-10: 60 mg via INTRAVENOUS

## 2020-02-10 NOTE — Op Note (Signed)
[facility] Patient Name: [name] Procedure Date: 02/10/2020 9:20 AM MRN: [id number] Date of Birth: 07-30-1946 Attending MD: Norvel Richards , MD CSN: [id number] Age: 74 Admit Type: Outpatient Procedure:                Colonoscopy Indications:              High risk colon cancer surveillance: Personal                            history of colonic polyps Providers:                Norvel Richards, MD, Otis Peak B. Sharon Seller, RN,                            Nelma Rothman, Technician Referring MD:              Medicines:                Propofol per Anesthesia Complications:            No immediate complications. Estimated Blood Loss:     Estimated blood loss: none. Procedure:                Pre-Anesthesia Assessment:                           - Prior to the procedure, a History and Physical                            was performed, and patient medications and                            allergies were reviewed. The patient's tolerance of                            previous anesthesia was also reviewed. The risks                            and benefits of the procedure and the sedation                            options and risks were discussed with the patient.                            All questions were answered, and informed consent                            was obtained. Prior Anticoagulants: The patient                            last took Eliquis (apixaban) 3 days prior to the                            procedure. ASA Grade Assessment: III - A patient  with severe systemic disease. After reviewing the                            risks and benefits, the patient was deemed in                            satisfactory condition to undergo the procedure.                           After obtaining informed consent, the colonoscope                            was passed under direct vision. Throughout the                            procedure, the  patient's blood pressure, pulse, and                            oxygen saturations were monitored continuously. The                            CF-[id number] ([id number]) scope was introduced through                            the anus and advanced to the the cecum, identified                            by appendiceal orifice and ileocecal valve. The                            colonoscopy was performed without difficulty. The                            patient tolerated the procedure well. The quality                            of the bowel preparation was adequate. Scope In: 9:45:45 AM Scope Out: 10:03:27 AM Scope Withdrawal Time: 0 hours 11 minutes 26 seconds  Total Procedure Duration: 0 hours 17 minutes 42 seconds  Findings:      Many small and large-mouthed diverticula were found in the entire colon.       Redundant colon. External abdominal pressure required to reach the cecum.      The exam was otherwise without abnormality on direct and retroflexion       views. Impression:               - Diverticulosis in the entire examined colon.                            Redundant colon.                           - The examination was otherwise normal on direct  and retroflexion views.                           - No specimens collected. Moderate Sedation:      Moderate (conscious) sedation was personally administered by an       anesthesia professional. The following parameters were monitored: oxygen       saturation, heart rate, blood pressure, respiratory rate, EKG, adequacy       of pulmonary ventilation, and response to care. Recommendation:           - Patient has a contact number available for                            emergencies. The signs and symptoms of potential                            delayed complications were discussed with the                            patient. Return to normal activities tomorrow.                            Written discharge  instructions were provided to the                            patient.                           - Resume previous diet.                           - Continue present medications.                           - No repeat colonoscopy due to the absence of                            colonic polyps.                           - Return to GI clinic PRN. Procedure Code(s):        --- Professional ---                           [phone], Colonoscopy, flexible; diagnostic, including                            collection of specimen(s) by brushing or washing,                            when performed (separate procedure) Diagnosis Code(s):        --- Professional ---                           Z86.010, Personal history of colonic polyps  K57.30, Diverticulosis of large intestine without                            perforation or abscess without bleeding CPT copyright 2019 American Medical Association. All rights reserved. The codes documented in this report are preliminary and upon coder review may  be revised to meet current compliance requirements. Cristopher Estimable. Kelsy Polack, MD Norvel Richards, MD 02/10/2020 10:15:21 AM This report has been signed electronically. Number of Addenda: 0

## 2020-02-10 NOTE — Anesthesia Postprocedure Evaluation (Signed)
Anesthesia Post Note  Patient: [name]  Procedure(s) Performed: COLONOSCOPY WITH PROPOFOL (N/A )  Patient location during evaluation: PACU Anesthesia Type: General Level of consciousness: awake, oriented, awake and alert and patient cooperative Pain management: pain level controlled Vital Signs Assessment: post-procedure vital signs reviewed and stable Respiratory status: spontaneous breathing, respiratory function stable, nonlabored ventilation and patient connected to nasal cannula oxygen Cardiovascular status: blood pressure returned to baseline and stable Postop Assessment: no headache and no backache Anesthetic complications: no   No complications documented.   Last Vitals:  Vitals:   02/10/20 1031 02/10/20 1034  BP:  (!) 144/53  Pulse: 82 83  Resp: 16 20  Temp:    SpO2: 100% 100%    Last Pain:  Vitals:   02/10/20 1031  TempSrc:   PainSc: 0-No pain                 Tacy Learn

## 2020-02-10 NOTE — H&P (Signed)
@LOGO @   Primary Care Physician:  [name], MD Primary Gastroenterologist:  Dr. Gala Romney  Pre-Procedure History & Physical: HPI:  [name] is a 74 y.o. female here for surveillance colonoscopy.  History of a colonic adenomas removed 2017.  Eliquis stopped 7/5.  Past Medical History:  Diagnosis Date  . Blood transfusion without reported diagnosis   . CAD (coronary artery disease)    Nonobstructive at cardiac catheterization 2000  . Cataract   . Cervical cancer ([place]) 1978  . CHF (congestive heart failure) ([place])   . Chronic back pain   . Chronic kidney disease    MWF dialysis  . COPD (chronic obstructive pulmonary disease) ([place])   . Degenerative disc disease   . Dysrhythmia    a-fib  . Essential hypertension, benign   . GERD (gastroesophageal reflux disease)   . Gout   . Hiatal hernia 07/27/2013  . History of diverticulitis of colon   . History of hiatal hernia   . Iron deficiency anemia   . Irritable bowel syndrome   . Lumbar radiculopathy   . Mixed hyperlipidemia   . Moderate major depression, single episode ([place]) 07/21/2019  . Neuropathy   . Osteoporosis   . Ovarian cancer [facility]) 1978   patient denies. States this was cervical cancer  . Oxygen deficiency    room air  . Sleep apnea   . Type 2 diabetes mellitus ([place])   . Vitamin B deficiency 12/25/2009  . Vitamin B12 deficiency     Past Surgical History:  Procedure Laterality Date  . ABDOMINAL HYSTERECTOMY    . AV FISTULA PLACEMENT Left 08/05/2019   Procedure: ARTERIOVENOUS (AV) FISTULA CREATION LEFT ARM;  Surgeon: Waynetta Sandy, MD;  Location: [place];  Service: Vascular;  Laterality: Left;  . BACK SURGERY    . BASCILIC VEIN TRANSPOSITION Left 10/05/2019   Procedure: SECOND STAGE LEFT BASCILIC VEIN TRANSPOSITION;  Surgeon: Waynetta Sandy, MD;  Location: [place];  Service: Vascular;  Laterality: Left;  . Benign breast cysts    . CHOLECYSTECTOMY    . COLONOSCOPY  10/01/2006    SLF:Pan colonic diverticulosis and moderate internal hemorrhoids/ Otherwise no polyps, masses, inflammatory changes or AVMs/  . COLONOSCOPY  2011   SLF: pancolonic diverticulosis, large internal hemorrhoids  . COLONOSCOPY N/A 01/26/2016   Procedure: COLONOSCOPY;  Surgeon: [name], MD;  [facility];  Service: Endoscopy;  Laterality: N/A;  830   . ESOPHAGOGASTRODUODENOSCOPY  11/19/2006   SLF: Large hiatal hernia without evidence of Cameron ulcers/. Distal esophageal stricture, which allowed the gastroscope to pass without resistance.  A 16 mm Savary later passed with mild resistance/ Normal stomach.sb bx negative  . ESOPHAGOGASTRODUODENOSCOPY  10/01/2006   FAO:ZHYQM hiatal hernia.  Distal esophagus without evidence of   erythema, ulceration or Barrett's esophagus  . ESOPHAGOGASTRODUODENOSCOPY  2011   SLF: large hh, distal esophageal web narrowing to 33mm s/p dilation to 35mm  . ESOPHAGOGASTRODUODENOSCOPY N/A 08/06/2013   SLF: 1. Stricture at the gastroesophageal junction 2. large hiatal hernia. 3. Mild erosive gastritis.  Marland Kitchen ESOPHAGOGASTRODUODENOSCOPY (EGD) WITH PROPOFOL N/A 07/19/2019   Eman Rynders: Mild erosive reflux esophagitis.  Mild Schatzki ring status post dilation.  Large hiatal hernia with at least one half of the stomach above the diaphragm.  Gastric mucosa erythematous.  Marland Kitchen GIVENS CAPSULE STUDY N/A 08/06/2013   INCOMPLETE-SMALL BOWLE ULCERS  . IR FLUORO GUIDE CV LINE LEFT  07/29/2019  . IR US GUIDE VASC ACCESS LEFT  07/29/2019  .  KNEE SURGERY Right   . PARTIAL HYSTERECTOMY  1978  . small bowel capsule  2008   negative  . SPINE SURGERY    . TONSILLECTOMY AND ADENOIDECTOMY    . Two back surgeries/fusion    . UMBILICAL HERNIA REPAIR  2010    Prior to Admission medications   Medication Sig Start Date End Date Taking? Authorizing Provider  acetaminophen (TYLENOL) 500 MG tablet Take 500-1,000 mg by mouth every 6 (six) hours as needed (for pain.).   Yes [provider]  allopurinol (ZYLOPRIM) 100 MG tablet Take 1 tablet (100 mg total) by mouth 2 (two) times daily. 08/09/19  Yes Nita Sells, MD  amiodarone (PACERONE) 200 MG tablet Take 1 tablet (200 mg total) by mouth daily. 08/10/19  Yes Nita Sells, MD  apixaban (ELIQUIS) 2.5 MG TABS tablet Take 2.5 mg by mouth 2 (two) times daily.   Yes [provider]  aspirin EC 81 MG tablet Take 81 mg by mouth daily.   Yes [provider]  atorvastatin (LIPITOR) 40 MG tablet Take 1 tablet (40 mg total) by mouth at bedtime. 05/04/18  Yes Satira Sark, MD  Biotin 5000 MCG TABS Take 5,000 mcg by mouth daily.   Yes [provider]  esomeprazole (NEXIUM) 40 MG capsule Take 40 mg by mouth daily. 12/06/19  Yes [provider]  famotidine (PEPCID) 40 MG tablet TAKE 1 TABLET AT BEDTIME AS NEEDED TO CONTROL REFLUX Patient taking differently: Take 40 mg by mouth at bedtime.  06/23/15  Yes Mannam, Praveen, MD  gabapentin (NEURONTIN) 100 MG capsule Take 100 mg by mouth at bedtime. 12/29/19  Yes [provider]  insulin lispro (HUMALOG) 100 UNIT/ML injection Inject 5-16 Units into the skin 3 (three) times daily as needed (greater than 120).    Yes [provider]  LANTUS SOLOSTAR 100 UNIT/ML SOPN Inject 10 Units into the skin daily as needed (over 120 blood sugars).  10/06/12  Yes [provider]  lidocaine (LIDODERM) 5 % Place 1 patch onto the skin daily. Remove & Discard patch within 12 hours or as directed by MD Patient taking differently: Place 1 patch onto the skin daily as needed (pain.). Remove & Discard patch within 12 hours or as directed by MD 11/21/19  Yes Garald Balding, PA-C  nitroGLYCERIN (NITROSTAT) 0.4 MG SL tablet Place 0.4 mg under the tongue every 5 (five) minutes x 3 doses as needed for chest pain. 12/07/19  Yes [provider]  ondansetron (ZOFRAN) 4 MG tablet Take 4 mg by mouth daily as needed for nausea/vomiting. 12/29/19  Yes  [provider]  polyethylene glycol-electrolytes (TRILYTE) 420 g solution Take 4,000 mLs by mouth as directed. 02/03/20  Yes Bettey Muraoka, Cristopher Estimable, MD  tamsulosin (FLOMAX) 0.4 MG CAPS capsule Take 1 capsule (0.4 mg total) by mouth daily. 07/08/19  Yes Johnson, Clanford L, MD  TRELEGY ELLIPTA 100-62.5-25 MCG/INH AEPB Inhale 1 puff into the lungs daily as needed (respiratory issues.).    Yes [provider]  albuterol (PROVENTIL) (2.5 MG/3ML) 0.083% nebulizer solution Inhale 3 mLs into the lungs daily as needed for wheezing or shortness of breath. 12/22/19   [provider]  albuterol (VENTOLIN HFA) 108 (90 Base) MCG/ACT inhaler Inhale 2 puffs into the lungs every 6 (six) hours as needed for wheezing or shortness of breath.  04/09/19   [provider]  HYDROcodone-acetaminophen (NORCO) 5-325 MG tablet Take 1 tablet by mouth every 6 (six) hours as needed for  moderate pain. Patient not taking: Reported on 01/26/2020 10/05/19   Dagoberto Ligas, PA-C  metoprolol tartrate (LOPRESSOR) 25 MG tablet Take 1 tablet (25 mg total) by mouth 2 (two) times daily. Patient not taking: Reported on 01/26/2020 08/09/19   Nita Sells, MD  potassium chloride SA (KLOR-CON) 10 MEQ tablet Take 1 tablet (10 mEq total) by mouth every other day. Patient not taking: Reported on 01/26/2020 07/08/19   Murlean Iba, MD  sertraline (ZOLOFT) 50 MG tablet Take 1 tablet (50 mg total) by mouth daily. Patient not taking: Reported on 01/26/2020 08/09/19   Nita Sells, MD    Allergies as of 11/09/2019  . (No Known Allergies)    Family History  Problem Relation Age of Onset  . Colon cancer Brother        diagnosed age 98. Living.   Marland Kitchen Ulcers Sister   . Diabetes Sister   . Heart attack Sister   . Kidney failure Sister   . Stroke Sister   . Ulcers Mother   . Diabetes Mother   . Heart attack Mother   . Stroke Mother   . Asthma Mother   . Heart disease Mother   . Cervical cancer Mother    . Heart attack Brother   . Heart disease Brother   . Asthma Sister   . Diabetes Brother   . Stroke Maternal Grandmother   . Heart attack Maternal Grandmother   . Heart attack Other   . Early death Father        MVA in his 32s    Social History   Socioeconomic History  . Marital status: Widowed    Spouse name: Dimperio  . Number of children: 4  . Years of education: Not on file  . Highest education level: 10th grade  Occupational History  . 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 Smoking status: Former Smoker    Packs/day: 1.00    Years: 1.00    Pack years: 1.00    Types: Cigarettes    Start date: 02/19/1961    Quit date: 08/05/1961    Years since quitting: 58.5  . Smokeless tobacco: Never Used  . Tobacco comment: Quit smoking x 50 years  Vaping Use  . Vaping Use: Never used  Substance and Sexual Activity  . Alcohol use: No  . Drug use: No  . Sexual activity: Not Currently  Other Topics Concern  . Not on file  Social History Narrative   HAS 4 SON-GRAND [place].   Lives in home with [place] - married 13 Y   Cook, sew, quilt, crochet   Social Determinants of Health   Financial Resource Strain:   . Difficulty of Paying Living Expenses:   Food Insecurity:   . Worried About Charity fundraiser in the Last Year:   . Arboriculturist in the Last Year:   Transportation Needs:   . Film/video editor (Medical):   Marland Kitchen Lack of Transportation (Non-Medical):   Physical Activity:   . Days of Exercise per Week:   . Minutes of Exercise per Session:   Stress:   . Feeling of Stress :   Social Connections:   . Frequency of Communication with Friends and Family:   . Frequency of Social Gatherings with Friends and Family:   . Attends Religious Services:   . Active Member of Clubs or Organizations:   . Attends Archivist Meetings:   .  Marital Status:   Intimate Partner Violence:   . Fear of Current or Ex-Partner:    . Emotionally Abused:   Marland Kitchen Physically Abused:   . Sexually Abused:     Review of Systems: See HPI, otherwise negative ROS  Physical Exam: LMP  (LMP Unknown)  General:   Alert,  Well-developed, well-nourished, pleasant and cooperative in NAD Neck:  Supple; no masses or thyromegaly. No significant cervical adenopathy. Lungs:  Clear throughout to auscultation.   No wheezes, crackles, or rhonchi. No acute distress. Heart:  Regular rate and rhythm; no murmurs, clicks, rubs,  or gallops. Abdomen: Non-distended, normal bowel sounds.  Soft and nontender without appreciable mass or hepatosplenomegaly.  Pulses:  Normal pulses noted. Extremities:  Without clubbing or edema.  Impression/Plan: 74 year old lady with history colonic adenoma.  Here for surveillance colonoscopy.  Eliquis stopped 11/5.  The risks, benefits, limitations, alternatives and imponderables have been reviewed with the patient. Questions have been answered. All parties are agreeable.     Notice: This dictation was prepared with Dragon dictation along with smaller phrase technology. Any transcriptional errors that result from this process are unintentional and may not be corrected upon review.

## 2020-02-10 NOTE — Transfer of Care (Signed)
Immediate Anesthesia Transfer of Care Note  Patient: EVERLEY EVORA  Procedure(s) Performed: COLONOSCOPY WITH PROPOFOL (N/A )  Patient Location: PACU  Anesthesia Type:General  Level of Consciousness: awake, alert , oriented and patient cooperative  Airway & Oxygen Therapy: Patient Spontanous Breathing and Patient connected to nasal cannula oxygen  Post-op Assessment: Report given to RN, Post -op Vital signs reviewed and stable and Patient moving all extremities  Post vital signs: Reviewed  Last Vitals:  Vitals Value Taken Time  BP 83/34 02/10/20 1019  Temp 36.7 C 02/10/20 1006  Pulse 73 02/10/20 1020  Resp 17 02/10/20 1020  SpO2 100 % 02/10/20 1020  Vitals shown include unvalidated device data.  Last Pain:  Vitals:   02/10/20 0942  TempSrc:   PainSc: 0-No pain      Patients Stated Pain Goal: 6 (50/75/73 2256)  Complications: No complications documented.

## 2020-02-10 NOTE — Anesthesia Preprocedure Evaluation (Signed)
Anesthesia Evaluation  Patient identified by MRN, date of birth, ID band Patient awake    Reviewed: Allergy & Precautions, NPO status , Patient's Chart, lab work & pertinent test results  History of Anesthesia Complications Negative for: history of anesthetic complications  Airway Mallampati: III  TM Distance: >3 FB Neck ROM: Full    Dental  (+) Missing, Dental Advisory Given   Pulmonary sleep apnea , COPD,  oxygen dependent, former smoker,    Pulmonary exam normal breath sounds clear to auscultation       Cardiovascular hypertension, + CAD and +CHF  Normal cardiovascular exam+ dysrhythmias Atrial Fibrillation  Rhythm:Regular Rate:Normal  1. Left ventricular ejection fraction, by visual estimation, is 55 to  60%. The left ventricle has normal function. There is mildly increased  left ventricular hypertrophy.  2. Left ventricular diastolic function could not be evaluated.  3. The left ventricle has no regional wall motion abnormalities.  4. Global right ventricle has normal systolic function.The right  ventricular size is normal. No increase in right ventricular wall  thickness.  5. Left atrial size was moderately dilated.  6. Right atrial size was mildly dilated.  7. Mild mitral annular calcification.  8. The mitral valve is degenerative. Trivial mitral valve regurgitation.  9. The tricuspid valve is grossly normal. Tricuspid valve regurgitation  moderate.  10. The tricuspid valve was normal in structure. Tricuspid valve  regurgitation moderate.  11. The aortic valve is grossly normal. Aortic valve regurgitation is not  visualized.  12. Moderately elevated pulmonary artery systolic pressure.  13. The tricuspid regurgitant velocity is 3.12 m/s, and with an assumed  right atrial pressure of 15 mmHg, the estimated right ventricular systolic  pressure is moderately elevated at 54.0 mmHg.  14. The inferior vena cava is  dilated in size with <50% respiratory  variability, suggesting right atrial pressure of 15 mmHg.    Neuro/Psych PSYCHIATRIC DISORDERS Depression  Neuromuscular disease    GI/Hepatic hiatal hernia, GERD (no reflux now)  Medicated and Poorly Controlled,  Endo/Other  diabetes, Well Controlled, Type 2, Oral Hypoglycemic Agents  Renal/GU DialysisRenal disease     Musculoskeletal  (+) Arthritis  (Back pain), Osteoarthritis,    Abdominal   Peds  Hematology  (+) anemia ,   Anesthesia Other Findings   Reproductive/Obstetrics                             Anesthesia Physical Anesthesia Plan  ASA: III  Anesthesia Plan: General   Post-op Pain Management:    Induction: Intravenous  PONV Risk Score and Plan:   Airway Management Planned: Nasal Cannula, Natural Airway and Simple Face Mask  Additional Equipment:   Intra-op Plan:   Post-operative Plan:   Informed Consent: I have reviewed the patients History and Physical, chart, labs and discussed the procedure including the risks, benefits and alternatives for the proposed anesthesia with the patient or authorized representative who has indicated his/her understanding and acceptance.     Dental advisory given  Plan Discussed with: CRNA and Surgeon  Anesthesia Plan Comments:         Anesthesia Quick Evaluation

## 2020-02-10 NOTE — Discharge Instructions (Signed)
Colonoscopy Discharge Instructions  Read the instructions outlined below and refer to this sheet in the next few weeks. These discharge instructions provide you with general information on caring for yourself after you leave the hospital. Your doctor may also give you specific instructions. While your treatment has been planned according to the most current medical practices available, unavoidable complications occasionally occur. If you have any problems or questions after discharge, call Dr. Gala Romney at [phone]. ACTIVITY  You may resume your regular activity, but move at a slower pace for the next 24 hours.   Take frequent rest periods for the next 24 hours.   Walking will help get rid of the air and reduce the bloated feeling in your belly (abdomen).   No driving for 24 hours (because of the medicine (anesthesia) used during the test).    Do not sign any important legal documents or operate any machinery for 24 hours (because of the anesthesia used during the test).  NUTRITION  Drink plenty of fluids.   You may resume your normal diet as instructed by your doctor.   Begin with a light meal and progress to your normal diet. Heavy or fried foods are harder to digest and may make you feel sick to your stomach (nauseated).   Avoid alcoholic beverages for 24 hours or as instructed.  MEDICATIONS  You may resume your normal medications unless your doctor tells you otherwise.  WHAT YOU CAN EXPECT TODAY  Some feelings of bloating in the abdomen.   Passage of more gas than usual.   Spotting of blood in your stool or on the toilet paper.  IF YOU HAD POLYPS REMOVED DURING THE COLONOSCOPY:  No aspirin products for 7 days or as instructed.   No alcohol for 7 days or as instructed.   Eat a soft diet for the next 24 hours.  FINDING OUT THE RESULTS OF YOUR TEST Not all test results are available during your visit. If your test results are not back during the visit, make an appointment  with your caregiver to find out the results. Do not assume everything is normal if you have not heard from your caregiver or the medical facility. It is important for you to follow up on all of your test results.  SEEK IMMEDIATE MEDICAL ATTENTION IF:  You have more than a spotting of blood in your stool.   Your belly is swollen (abdominal distention).   You are nauseated or vomiting.   You have a temperature over 101.   You have abdominal pain or discomfort that is severe or gets worse throughout the day.    Diverticulosis information provided  No polyps found today  I do not recommend a future colonoscopy unless new symptoms develop  Resume Eliquis today  At patient request, I called Franco Collet at [phone] -reviewed results   Diverticulosis  Diverticulosis is a condition that develops when small pouches (diverticula) form in the wall of the large intestine (colon). The colon is where water is absorbed and stool (feces) is formed. The pouches form when the inside layer of the colon pushes through weak spots in the outer layers of the colon. You may have a few pouches or many of them. The pouches usually do not cause problems unless they become inflamed or infected. When this happens, the condition is called diverticulitis. What are the causes? The cause of this condition is not known. What increases the risk? The following factors may make you more likely to develop this  condition:  Being older than age 3. Your risk for this condition increases with age. Diverticulosis is rare among people younger than age 60. By age 42, many people have it.  Eating a low-fiber diet.  Having frequent constipation.  Being overweight.  Not getting enough exercise.  Smoking.  Taking over-the-counter pain medicines, like aspirin and ibuprofen.  Having a family history of diverticulosis. What are the signs or symptoms? In most people, there are no symptoms of this condition. If you  do have symptoms, they may include:  Bloating.  Cramps in the abdomen.  Constipation or diarrhea.  Pain in the lower left side of the abdomen. How is this diagnosed? Because diverticulosis usually has no symptoms, it is most often diagnosed during an exam for other colon problems. The condition may be diagnosed by:  Using a flexible scope to examine the colon (colonoscopy).  Taking an X-ray of the colon after dye has been put into the colon (barium enema).  Having a CT scan. How is this treated? You may not need treatment for this condition. Your health care provider may recommend treatment to prevent problems. You may need treatment if you have symptoms or if you previously had diverticulitis. Treatment may include:  Eating a high-fiber diet.  Taking a fiber supplement.  Taking a live bacteria supplement (probiotic).  Taking medicine to relax your colon. Follow these instructions at home: Medicines  Take over-the-counter and prescription medicines only as told by your health care provider.  If told by your health care provider, take a fiber supplement or probiotic. Constipation prevention Your condition may cause constipation. To prevent or treat constipation, you may need to:  Drink enough fluid to keep your urine pale yellow.  Take over-the-counter or prescription medicines.  Eat foods that are high in fiber, such as beans, whole grains, and fresh fruits and vegetables.  Limit foods that are high in fat and processed sugars, such as fried or sweet foods.  General instructions  Try not to strain when you have a bowel movement.  Keep all follow-up visits as told by your health care provider. This is important. Contact a health care provider if you:  Have pain in your abdomen.  Have bloating.  Have cramps.  Have not had a bowel movement in 3 days. Get help right away if:  Your pain gets worse.  Your bloating becomes very bad.  You have a fever or  chills, and your symptoms suddenly get worse.  You vomit.  You have bowel movements that are bloody or black.  You have bleeding from your rectum. Summary  Diverticulosis is a condition that develops when small pouches (diverticula) form in the wall of the large intestine (colon).  You may have a few pouches or many of them.  This condition is most often diagnosed during an exam for other colon problems.  Treatment may include increasing the fiber in your diet, taking supplements, or taking medicines. This information is not intended to replace advice given to you by your health care provider. Make sure you discuss any questions you have with your health care provider. Document Revised: 02/18/2019 Document Reviewed: 02/18/2019 Elsevier Patient Education  [place].

## 2020-02-18 ENCOUNTER — Encounter (HOSPITAL_COMMUNITY): Payer: Self-pay | Admitting: Internal Medicine

## 2020-03-06 ENCOUNTER — Emergency Department (HOSPITAL_COMMUNITY): Payer: Medicare Other

## 2020-03-06 ENCOUNTER — Encounter (HOSPITAL_COMMUNITY): Payer: Self-pay | Admitting: Emergency Medicine

## 2020-03-06 ENCOUNTER — Inpatient Hospital Stay (HOSPITAL_COMMUNITY)
Admission: EM | Admit: 2020-03-06 | Discharge: 2020-03-08 | DRG: 640 | Disposition: A | Discharge status: Home Health | Payer: Medicare Other | Attending: Family Medicine | Admitting: Family Medicine

## 2020-03-06 ENCOUNTER — Other Ambulatory Visit: Payer: Self-pay

## 2020-03-06 DIAGNOSIS — Z20822 Contact with and (suspected) exposure to covid-19: Secondary | ICD-10-CM | POA: Diagnosis present

## 2020-03-06 DIAGNOSIS — K2100 Gastro-esophageal reflux disease with esophagitis, without bleeding: Secondary | ICD-10-CM | POA: Diagnosis present

## 2020-03-06 DIAGNOSIS — M1A30X0 Chronic gout due to renal impairment, unspecified site, without tophus (tophi): Secondary | ICD-10-CM | POA: Diagnosis present

## 2020-03-06 DIAGNOSIS — Z992 Dependence on renal dialysis: Secondary | ICD-10-CM | POA: Diagnosis not present

## 2020-03-06 DIAGNOSIS — N2581 Secondary hyperparathyroidism of renal origin: Secondary | ICD-10-CM | POA: Diagnosis present

## 2020-03-06 DIAGNOSIS — J208 Acute bronchitis due to other specified organisms: Secondary | ICD-10-CM | POA: Diagnosis not present

## 2020-03-06 DIAGNOSIS — Z794 Long term (current) use of insulin: Secondary | ICD-10-CM

## 2020-03-06 DIAGNOSIS — Z7982 Long term (current) use of aspirin: Secondary | ICD-10-CM | POA: Diagnosis not present

## 2020-03-06 DIAGNOSIS — M810 Age-related osteoporosis without current pathological fracture: Secondary | ICD-10-CM | POA: Diagnosis present

## 2020-03-06 DIAGNOSIS — I2510 Atherosclerotic heart disease of native coronary artery without angina pectoris: Secondary | ICD-10-CM | POA: Diagnosis present

## 2020-03-06 DIAGNOSIS — Z8541 Personal history of malignant neoplasm of cervix uteri: Secondary | ICD-10-CM | POA: Diagnosis not present

## 2020-03-06 DIAGNOSIS — J9611 Chronic respiratory failure with hypoxia: Secondary | ICD-10-CM | POA: Diagnosis present

## 2020-03-06 DIAGNOSIS — Z7901 Long term (current) use of anticoagulants: Secondary | ICD-10-CM

## 2020-03-06 DIAGNOSIS — I4891 Unspecified atrial fibrillation: Secondary | ICD-10-CM | POA: Diagnosis present

## 2020-03-06 DIAGNOSIS — E875 Hyperkalemia: Principal | ICD-10-CM | POA: Diagnosis present

## 2020-03-06 DIAGNOSIS — J440 Chronic obstructive pulmonary disease with acute lower respiratory infection: Secondary | ICD-10-CM | POA: Diagnosis present

## 2020-03-06 DIAGNOSIS — Z87891 Personal history of nicotine dependence: Secondary | ICD-10-CM

## 2020-03-06 DIAGNOSIS — D631 Anemia in chronic kidney disease: Secondary | ICD-10-CM | POA: Diagnosis present

## 2020-03-06 DIAGNOSIS — Z9115 Patient's noncompliance with renal dialysis: Secondary | ICD-10-CM | POA: Diagnosis not present

## 2020-03-06 DIAGNOSIS — I10 Essential (primary) hypertension: Secondary | ICD-10-CM

## 2020-03-06 DIAGNOSIS — E1169 Type 2 diabetes mellitus with other specified complication: Secondary | ICD-10-CM | POA: Diagnosis present

## 2020-03-06 DIAGNOSIS — N186 End stage renal disease: Secondary | ICD-10-CM | POA: Diagnosis present

## 2020-03-06 DIAGNOSIS — M1030 Gout due to renal impairment, unspecified site: Secondary | ICD-10-CM | POA: Diagnosis present

## 2020-03-06 DIAGNOSIS — J441 Chronic obstructive pulmonary disease with (acute) exacerbation: Secondary | ICD-10-CM | POA: Diagnosis not present

## 2020-03-06 DIAGNOSIS — K219 Gastro-esophageal reflux disease without esophagitis: Secondary | ICD-10-CM | POA: Diagnosis present

## 2020-03-06 DIAGNOSIS — E782 Mixed hyperlipidemia: Secondary | ICD-10-CM

## 2020-03-06 DIAGNOSIS — J209 Acute bronchitis, unspecified: Secondary | ICD-10-CM | POA: Diagnosis present

## 2020-03-06 DIAGNOSIS — Z9981 Dependence on supplemental oxygen: Secondary | ICD-10-CM

## 2020-03-06 DIAGNOSIS — Z90711 Acquired absence of uterus with remaining cervical stump: Secondary | ICD-10-CM

## 2020-03-06 DIAGNOSIS — R0600 Dyspnea, unspecified: Secondary | ICD-10-CM

## 2020-03-06 DIAGNOSIS — Z79899 Other long term (current) drug therapy: Secondary | ICD-10-CM | POA: Diagnosis not present

## 2020-03-06 DIAGNOSIS — I132 Hypertensive heart and chronic kidney disease with heart failure and with stage 5 chronic kidney disease, or end stage renal disease: Secondary | ICD-10-CM | POA: Diagnosis present

## 2020-03-06 DIAGNOSIS — I5033 Acute on chronic diastolic (congestive) heart failure: Secondary | ICD-10-CM | POA: Diagnosis not present

## 2020-03-06 DIAGNOSIS — E1122 Type 2 diabetes mellitus with diabetic chronic kidney disease: Secondary | ICD-10-CM | POA: Diagnosis present

## 2020-03-06 DIAGNOSIS — B9689 Other specified bacterial agents as the cause of diseases classified elsewhere: Secondary | ICD-10-CM

## 2020-03-06 DIAGNOSIS — E1142 Type 2 diabetes mellitus with diabetic polyneuropathy: Secondary | ICD-10-CM | POA: Diagnosis present

## 2020-03-06 DIAGNOSIS — I5032 Chronic diastolic (congestive) heart failure: Secondary | ICD-10-CM | POA: Diagnosis present

## 2020-03-06 LAB — I-STAT CHEM 8, ED
BUN: 66 mg/dL — ABNORMAL HIGH (ref 8–23)
Calcium, Ion: 0.86 mmol/L — CL (ref 1.15–1.40)
Chloride: 104 mmol/L (ref 98–111)
Creatinine, Ser: 10.8 mg/dL — ABNORMAL HIGH (ref 0.44–1.00)
Glucose, Bld: 157 mg/dL — ABNORMAL HIGH (ref 70–99)
HCT: 37 % (ref 36.0–46.0)
Hemoglobin: 12.6 g/dL (ref 12.0–15.0)
Potassium: 8 mmol/L (ref 3.5–5.1)
Sodium: 138 mmol/L (ref 135–145)
TCO2: 25 mmol/L (ref 22–32)

## 2020-03-06 LAB — CBC
HCT: 38 % (ref 36.0–46.0)
Hemoglobin: 11.8 g/dL — ABNORMAL LOW (ref 12.0–15.0)
MCH: 30.3 pg (ref 26.0–34.0)
MCHC: 31.1 g/dL (ref 30.0–36.0)
MCV: 97.4 fL (ref 80.0–100.0)
Platelets: 185 10*3/uL (ref 150–400)
RBC: 3.9 MIL/uL (ref 3.87–5.11)
RDW: 14.8 % (ref 11.5–15.5)
WBC: 7.4 10*3/uL (ref 4.0–10.5)
nRBC: 0 % (ref 0.0–0.2)

## 2020-03-06 LAB — RENAL FUNCTION PANEL
Albumin: 3.2 g/dL — ABNORMAL LOW (ref 3.5–5.0)
Anion gap: 14 (ref 5–15)
BUN: 70 mg/dL — ABNORMAL HIGH (ref 8–23)
CO2: 25 mmol/L (ref 22–32)
Calcium: 7.3 mg/dL — ABNORMAL LOW (ref 8.9–10.3)
Chloride: 102 mmol/L (ref 98–111)
Creatinine, Ser: 9.93 mg/dL — ABNORMAL HIGH (ref 0.44–1.00)
GFR calc Af Amer: 4 mL/min — ABNORMAL LOW (ref >60)
GFR calc non Af Amer: 3 mL/min — ABNORMAL LOW (ref >60)
Glucose, Bld: 58 mg/dL — ABNORMAL LOW (ref 70–99)
Phosphorus: 10 mg/dL — ABNORMAL HIGH (ref 2.5–4.6)
Potassium: >7.5 mmol/L (ref 3.5–5.1)
Sodium: 141 mmol/L (ref 135–145)

## 2020-03-06 LAB — BASIC METABOLIC PANEL
Anion gap: 15 (ref 5–15)
BUN: 70 mg/dL — ABNORMAL HIGH (ref 8–23)
CO2: 24 mmol/L (ref 22–32)
Calcium: 7.1 mg/dL — ABNORMAL LOW (ref 8.9–10.3)
Chloride: 101 mmol/L (ref 98–111)
Creatinine, Ser: 10.11 mg/dL — ABNORMAL HIGH (ref 0.44–1.00)
GFR calc Af Amer: 4 mL/min — ABNORMAL LOW (ref >60)
GFR calc non Af Amer: 3 mL/min — ABNORMAL LOW (ref >60)
Glucose, Bld: 174 mg/dL — ABNORMAL HIGH (ref 70–99)
Potassium: >7.5 mmol/L (ref 3.5–5.1)
Sodium: 140 mmol/L (ref 135–145)

## 2020-03-06 LAB — TROPONIN I (HIGH SENSITIVITY)
Troponin I (High Sensitivity): 13 ng/L (ref <18)
Troponin I (High Sensitivity): 12 ng/L (ref <18)

## 2020-03-06 LAB — CBG MONITORING, ED
Glucose-Capillary: 77 mg/dL (ref 70–99)
Glucose-Capillary: 132 mg/dL — ABNORMAL HIGH (ref 70–99)

## 2020-03-06 LAB — BRAIN NATRIURETIC PEPTIDE: B Natriuretic Peptide: 515.5 pg/mL — ABNORMAL HIGH (ref 0.0–100.0)

## 2020-03-06 LAB — SARS CORONAVIRUS 2 BY RT PCR (HOSPITAL ORDER, PERFORMED IN ~~LOC~~ HOSPITAL LAB): SARS Coronavirus 2: NEGATIVE

## 2020-03-06 LAB — HIV ANTIBODY (ROUTINE TESTING W REFLEX): HIV Screen 4th Generation wRfx: NONREACTIVE

## 2020-03-06 LAB — GLUCOSE, CAPILLARY: Glucose-Capillary: 140 mg/dL — ABNORMAL HIGH (ref 70–99)

## 2020-03-06 MED ORDER — PANTOPRAZOLE SODIUM 40 MG PO TBEC
40.0000 mg | DELAYED_RELEASE_TABLET | Freq: Every day | ORAL | Status: DC
  Administered 2020-03-06 – 2020-03-08 (×3): 40 mg via ORAL
  Filled 2020-03-06 (×2): qty 1

## 2020-03-06 MED ORDER — SODIUM CHLORIDE 0.9 % IV SOLN: 100.0000 mL | INTRAVENOUS | Status: DC | PRN

## 2020-03-06 MED ORDER — POLYVINYL ALCOHOL 1.4 % OP SOLN
1.0000 [drp] | Freq: Every day | OPHTHALMIC | Status: DC | PRN
  Filled 2020-03-06: qty 15

## 2020-03-06 MED ORDER — INSULIN GLARGINE 100 UNIT/ML SOLOSTAR PEN: 10.0000 [IU] | PEN_INJECTOR | Freq: Every day | SUBCUTANEOUS | Status: DC

## 2020-03-06 MED ORDER — POLYETHYLENE GLYCOL 3350 17 G PO PACK: 17.0000 g | PACK | Freq: Every day | ORAL | Status: DC | PRN

## 2020-03-06 MED ORDER — ACETAMINOPHEN 325 MG PO TABS: 650.0000 mg | ORAL_TABLET | Freq: Four times a day (QID) | ORAL | Status: DC | PRN

## 2020-03-06 MED ORDER — ALTEPLASE 2 MG IJ SOLR: 2.0000 mg | Freq: Once | INTRAMUSCULAR | Status: DC | PRN

## 2020-03-06 MED ORDER — ALBUMIN HUMAN 25 % IV SOLN
INTRAVENOUS | Status: AC
  Administered 2020-03-06: 25 g
  Filled 2020-03-06: qty 100

## 2020-03-06 MED ORDER — CALCIUM GLUCONATE-NACL 1-0.675 GM/50ML-% IV SOLN
1.0000 g | Freq: Once | INTRAVENOUS | Status: AC
  Administered 2020-03-06: 1000 mg via INTRAVENOUS
  Filled 2020-03-06: qty 50

## 2020-03-06 MED ORDER — ACETAMINOPHEN 650 MG RE SUPP: 650.0000 mg | Freq: Four times a day (QID) | RECTAL | Status: DC | PRN

## 2020-03-06 MED ORDER — BIOTIN 5000 MCG PO TABS: 5000.0000 ug | ORAL_TABLET | Freq: Every day | ORAL | Status: DC

## 2020-03-06 MED ORDER — METHYLPREDNISOLONE SODIUM SUCC 125 MG IJ SOLR
60.0000 mg | Freq: Two times a day (BID) | INTRAMUSCULAR | Status: DC
  Administered 2020-03-06 – 2020-03-08 (×5): 60 mg via INTRAVENOUS
  Filled 2020-03-06 (×5): qty 2

## 2020-03-06 MED ORDER — ONDANSETRON HCL 4 MG/2ML IJ SOLN: 4.0000 mg | Freq: Four times a day (QID) | INTRAMUSCULAR | Status: DC | PRN

## 2020-03-06 MED ORDER — IPRATROPIUM-ALBUTEROL 0.5-2.5 (3) MG/3ML IN SOLN
3.0000 mL | Freq: Four times a day (QID) | RESPIRATORY_TRACT | Status: DC
  Administered 2020-03-06: 3 mL via RESPIRATORY_TRACT
  Filled 2020-03-06: qty 3

## 2020-03-06 MED ORDER — HEPARIN SODIUM (PORCINE) 1000 UNIT/ML IJ SOLN
INTRAMUSCULAR | Status: AC
  Filled 2020-03-06: qty 4

## 2020-03-06 MED ORDER — IPRATROPIUM-ALBUTEROL 0.5-2.5 (3) MG/3ML IN SOLN
RESPIRATORY_TRACT | Status: AC
  Filled 2020-03-06: qty 3

## 2020-03-06 MED ORDER — IPRATROPIUM-ALBUTEROL 0.5-2.5 (3) MG/3ML IN SOLN
3.0000 mL | Freq: Three times a day (TID) | RESPIRATORY_TRACT | Status: DC
  Administered 2020-03-07 (×2): 3 mL via RESPIRATORY_TRACT
  Filled 2020-03-06 (×3): qty 3

## 2020-03-06 MED ORDER — GABAPENTIN 100 MG PO CAPS
100.0000 mg | ORAL_CAPSULE | Freq: Every day | ORAL | Status: DC
  Administered 2020-03-06 – 2020-03-07 (×2): 100 mg via ORAL
  Filled 2020-03-06 (×2): qty 1

## 2020-03-06 MED ORDER — INSULIN ASPART 100 UNIT/ML ~~LOC~~ SOLN
0.0000 [IU] | Freq: Three times a day (TID) | SUBCUTANEOUS | Status: DC
  Administered 2020-03-06 – 2020-03-07 (×3): 2 [IU] via SUBCUTANEOUS
  Administered 2020-03-07: 5 [IU] via SUBCUTANEOUS
  Administered 2020-03-08 (×2): 3 [IU] via SUBCUTANEOUS
  Administered 2020-03-08: 2 [IU] via SUBCUTANEOUS

## 2020-03-06 MED ORDER — ONDANSETRON HCL 4 MG PO TABS: 4.0000 mg | ORAL_TABLET | Freq: Four times a day (QID) | ORAL | Status: DC | PRN

## 2020-03-06 MED ORDER — ALLOPURINOL 100 MG PO TABS
100.0000 mg | ORAL_TABLET | Freq: Two times a day (BID) | ORAL | Status: DC
  Administered 2020-03-06 – 2020-03-08 (×5): 100 mg via ORAL
  Filled 2020-03-06 (×6): qty 1

## 2020-03-06 MED ORDER — NITROGLYCERIN 0.4 MG SL SUBL: 0.4000 mg | SUBLINGUAL_TABLET | SUBLINGUAL | Status: DC | PRN

## 2020-03-06 MED ORDER — AMIODARONE HCL 200 MG PO TABS
200.0000 mg | ORAL_TABLET | Freq: Every day | ORAL | Status: DC
  Administered 2020-03-06 – 2020-03-08 (×3): 200 mg via ORAL
  Filled 2020-03-06 (×3): qty 1

## 2020-03-06 MED ORDER — SODIUM CHLORIDE 0.9% FLUSH
3.0000 mL | Freq: Once | INTRAVENOUS | Status: AC
  Administered 2020-03-07: 3 mL via INTRAVENOUS

## 2020-03-06 MED ORDER — TAMSULOSIN HCL 0.4 MG PO CAPS
0.4000 mg | ORAL_CAPSULE | Freq: Every day | ORAL | Status: DC
  Administered 2020-03-06 – 2020-03-08 (×3): 0.4 mg via ORAL
  Filled 2020-03-06 (×3): qty 1

## 2020-03-06 MED ORDER — ASPIRIN EC 81 MG PO TBEC
81.0000 mg | DELAYED_RELEASE_TABLET | Freq: Every day | ORAL | Status: DC
  Administered 2020-03-06 – 2020-03-08 (×3): 81 mg via ORAL
  Filled 2020-03-06 (×3): qty 1

## 2020-03-06 MED ORDER — INSULIN GLARGINE 100 UNIT/ML ~~LOC~~ SOLN
10.0000 [IU] | Freq: Every day | SUBCUTANEOUS | Status: DC
  Administered 2020-03-06 – 2020-03-08 (×3): 10 [IU] via SUBCUTANEOUS
  Filled 2020-03-06 (×3): qty 0.1

## 2020-03-06 MED ORDER — IPRATROPIUM-ALBUTEROL 0.5-2.5 (3) MG/3ML IN SOLN: 3.0000 mL | RESPIRATORY_TRACT | Status: DC | PRN

## 2020-03-06 MED ORDER — DEXTROSE 50 % IV SOLN
50.0000 mL | Freq: Once | INTRAVENOUS | Status: AC
  Administered 2020-03-06: 50 mL via INTRAVENOUS
  Filled 2020-03-06: qty 50

## 2020-03-06 MED ORDER — FAMOTIDINE 20 MG PO TABS: 40.0000 mg | ORAL_TABLET | Freq: Every evening | ORAL | Status: DC | PRN

## 2020-03-06 MED ORDER — CHLORHEXIDINE GLUCONATE CLOTH 2 % EX PADS
6.0000 | MEDICATED_PAD | Freq: Every day | CUTANEOUS | Status: DC
  Administered 2020-03-07: 6 via TOPICAL

## 2020-03-06 MED ORDER — ALBUTEROL SULFATE HFA 108 (90 BASE) MCG/ACT IN AERS
4.0000 | INHALATION_SPRAY | Freq: Once | RESPIRATORY_TRACT | Status: AC
  Administered 2020-03-06: 4 via RESPIRATORY_TRACT
  Filled 2020-03-06: qty 6.7

## 2020-03-06 MED ORDER — HEPARIN SODIUM (PORCINE) 1000 UNIT/ML DIALYSIS
40.0000 [IU]/kg | INTRAMUSCULAR | Status: DC | PRN
  Administered 2020-03-08: 2900 [IU] via INTRAVENOUS_CENTRAL
  Filled 2020-03-06 (×4): qty 3

## 2020-03-06 MED ORDER — APIXABAN 2.5 MG PO TABS
2.5000 mg | ORAL_TABLET | Freq: Two times a day (BID) | ORAL | Status: DC
  Administered 2020-03-06 – 2020-03-08 (×5): 2.5 mg via ORAL
  Filled 2020-03-06 (×6): qty 1

## 2020-03-06 MED ORDER — CARBOXYMETHYLCELLUL-GLYCERIN 1-0.25 % OP SOLN: 1.0000 [drp] | Freq: Every day | OPHTHALMIC | Status: DC | PRN

## 2020-03-06 MED ORDER — SODIUM CHLORIDE 0.9 % IV SOLN
100.0000 mg | Freq: Two times a day (BID) | INTRAVENOUS | Status: DC
  Administered 2020-03-06 – 2020-03-08 (×5): 100 mg via INTRAVENOUS
  Filled 2020-03-06 (×6): qty 100

## 2020-03-06 MED ORDER — HEPARIN SODIUM (PORCINE) 1000 UNIT/ML DIALYSIS
1000.0000 [IU] | INTRAMUSCULAR | Status: DC | PRN
  Filled 2020-03-06: qty 1

## 2020-03-06 MED ORDER — INSULIN ASPART 100 UNIT/ML ~~LOC~~ SOLN
10.0000 [IU] | Freq: Once | SUBCUTANEOUS | Status: AC
  Administered 2020-03-06: 10 [IU] via INTRAVENOUS

## 2020-03-06 MED ORDER — ATORVASTATIN CALCIUM 40 MG PO TABS
40.0000 mg | ORAL_TABLET | Freq: Every day | ORAL | Status: DC
  Administered 2020-03-06 – 2020-03-07 (×2): 40 mg via ORAL
  Filled 2020-03-06 (×2): qty 1

## 2020-03-06 MED ORDER — HEPARIN SODIUM (PORCINE) 1000 UNIT/ML IJ SOLN
INTRAMUSCULAR | Status: AC
  Administered 2020-03-06: 2900 [IU] via INTRAVENOUS_CENTRAL
  Filled 2020-03-06: qty 3

## 2020-03-06 NOTE — Procedures (Signed)
I was present at this dialysis session. I have reviewed the session itself and made appropriate changes.   Vital signs in last 24 hours:  Temp:  [97.6 F (36.4 C)-98 F (36.7 C)] 98 F (36.7 C) (08/02 0430) Pulse Rate:  [34-106] 75 (08/02 0800) Resp:  [12-22] 16 (08/02 0800) BP: (61-145)/(33-59) 145/55 (08/02 0800) SpO2:  [95 %-100 %] 100 % (08/02 0630) Weight:  [72.6 kg] 72.6 kg (08/02 0129) Weight change:  Filed Weights   03/06/20 0129  Weight: 72.6 kg    Recent Labs  Lab 03/06/20 0634  NA 141  K >7.5*  CL 102  CO2 25  GLUCOSE 58*  BUN 70*  CREATININE 9.93*  CALCIUM 7.3*  PHOS 10.0*    Recent Labs  Lab 03/06/20 0143 03/06/20 0242  WBC 7.4  --   HGB 11.8* 12.6  HCT 38.0 37.0  MCV 97.4  --   PLT 185  --     Scheduled Meds: . Chlorhexidine Gluconate Cloth  6 each Topical Q0600  . heparin sodium (porcine)      . ipratropium-albuterol  3 mL Nebulization Q6H  . ipratropium-albuterol      . methylPREDNISolone (SOLU-MEDROL) injection  60 mg Intravenous BID  . sodium chloride flush  3 mL Intravenous Once   Continuous Infusions: . sodium chloride    . sodium chloride    . doxycycline (VIBRAMYCIN) IV     PRN Meds:.sodium chloride, sodium chloride, alteplase, heparin, heparin, ipratropium-albuterol     Dialysis Order:  MWF at [facility], EDW 78.5kg, 2K/2.5Ca, BFR 400/DFR 800, time 4 hours, UF profile 4, access RIJ TDC, LUE AVF maturing, no heparin bolus, calcitriol 0.75 mcg po qHD  Assessment and Plan: 1. Hyperkalemia- due to missed HD session.  Use low K bath and continue with 1 K due to persistently elevated K.  Recheck 2 hours after HD 2. ESRD- currently on HD. 3. OSA/COPD- on Bipap 4. DM type 2- per primary 5. SOB- acute on chronic likely due to hyperkalemia and missed HD.  UF as tolerated.   6. Acute on chronic dCHF- as above 7. HTN- stable 8. Disposition- hopeful discharge to home after HD.  Donetta Potts,  MD 03/06/2020, 8:22 AM

## 2020-03-06 NOTE — Progress Notes (Signed)
PROGRESS NOTE    [name]  BMW:[id number] DOB: 05-15-1946 DOA: 03/06/2020 PCP: [name], MD   Brief Narrative:  74 year old female with past medical history of end-stage renal disease (MWF schedule), coronary artery disease, obstructive sleep apnea (on BIPAP QHS), hyperlipidemia, gastroesophageal reflux disease, diabetes mellitus type 2, obesity, COPD, atrial fibrillation who presents to [facility] emergency department with complaints of shortness of breath. Patient explains that approximately 1 week ago she began to develop shortness of breath.  Initially the shortness of breath was mild in intensity but progressively worsened as the days followed.  The shortness of breath was worse with exertion and improved with rest.  Shortness of breath is associated with cough, intermittently productive with clear to white sputum.  Patient also complains of intermittent chest discomfort that she describes as midsternal in location moderate intensity and occurring with deep inspiration or cough. Patient states that as her symptoms worsened she developed associated generalized weakness and poor appetite.  Because of the patient's worsening symptoms she missed her Friday hemodialysis session. Upon further questioning patient denies recent travel, sick contacts or confirmed contacts with COVID-19 infection. Patient shortness of breath and cough continue to worsen and so she eventually presented to [facility] emergency department for evaluation.  Upon evaluation in the emergency department patient was found to have a markedly elevated potassium of 8.0 with peaked T waves on EKG.  Case was discussed with Dr. Posey Pronto with nephrology was arranged for emergent dialysis.  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-stage renal disease) (HCC)   Chronic respiratory failure with hypoxia (HCC)   Mixed diabetic hyperlipidemia associated with type 2 diabetes mellitus (HCC)   Chronic gout 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   Acute hyperkalemia   GERD with esophagitis   Acute bacterial bronchitis   Acute symptomatic hyperkalemia, POA - Patient presenting with severe hyperkalemia with EKG changes in the setting of missed dialysis - Patient already underwent dialysis per nephrology, appreciate insight and recommendations  - Continue telemetry, daily labs to ensure resolution of severe hyperkalemia  Acute respiratory distress on chronic hypoxia, POA  Rule out acute COPD exacerbation Volume overload in the setting of above, rule out heart failure exacerbation  - Respiratory distress improving drastically after dialysis likely volume overload is primary driving force behind her symptoms - Questionable concurrent COPD exacerbation although patient denies wheezing but given shortness of breath with dyspnea was initiated on doxycycline to cover acute bronchitis -continue short course, 5-day maximum - If respiratory status does not improve with volume control would consider steroids -Continue respiratory support, nebs  -currently holding steroids given possible infectious process  -Chronic respiratory failure 2 L nasal cannula around-the-clock, patient indicates she has been increasing her oxygen at night and with exertion but does not recall the precise number  SpO2: 94 % O2 Flow Rate (L/min): 4 L/min  Diabetes mellitus type 2 with end-stage renal disease on long-term insulin therapy, moderately well controlled Continue ACHS Accu-Cheks Lab Results  Component Value Date   HGBA1C 6.8 (H) 07/05/2019  Continue sliding scale insulin, hypoglycemic protocol, Lantus 10 units daily Continue diabetic diet  Benign essential HTN -  Continue home regimen of antihypertensive therapy  Gout -Not currently in acute flare  - Cntinue allopurinol  Mixed diabetic hyperlipidemia associated with type 2 diabetes mellitus ([place]) Continue home regimen of statin therapy  GERD with esophagitis Continue proton pump inhibitor therapy   DVT prophylaxis: Eliquis Code Status: Full Family Communication: None  Status is: Inpatient  Dispo: The patient is from: Home              Anticipated d/c is to: To be determined              Anticipated d/c date is: 87 to 72 hours              Patient currently not medically stable for discharge due to ongoing need for recurrent evaluation with nephrology for urgent/emergent dialysis, ongoing volume overload, acute hypoxic respiratory distress, possible COPD exacerbation with ongoing need for IV antibiotics and close monitoring in the setting of severe electrolyte derangements as above.  Consultants:   Nephrology  Procedures:   Dialysis  Antimicrobials:  Doxycycline 03/05/2020 --> discontinue 03/09/2020  Subjective: No acute issues or events overnight, patient tolerated dialysis quite well, at this point otherwise denies chest pain, shortness of breath, nausea, vomiting, diarrhea, constipation, headache, fevers, chills.  Objective: Vitals:   03/06/20 0630 03/06/20 0700 03/06/20 0730 03/06/20 0800  BP: (!) 116/52 (!) 127/56 (!) 125/59 (!) 145/55  Pulse: 69 73 66 75  Resp: 13 14 14 16   Temp:      TempSrc:      SpO2: 100%     Weight:      Height:       No intake or output data in the 24 hours ending 03/06/20 0816 Filed Weights   03/06/20 0129  Weight: 72.6 kg    Examination:  General exam: Appears calm and comfortable  Respiratory system: Clear to auscultation. Respiratory effort normal. Cardiovascular system: S1 & S2 heard, RRR. No JVD, murmurs, rubs, gallops or clicks. No pedal edema. Gastrointestinal system: Abdomen is nondistended, soft and nontender. No organomegaly or  masses felt. Normal bowel sounds heard. Central nervous system: Alert and oriented. No focal neurological deficits. Extremities: Symmetric 5 x 5 power. Skin: No rashes, lesions or ulcers Psychiatry: Judgement and insight appear normal. Mood & affect appropriate.     Data Reviewed: I have personally reviewed following labs and imaging studies  CBC: Recent Labs  Lab 03/06/20 0143 03/06/20 0242  WBC 7.4  --   HGB 11.8* 12.6  HCT 38.0 37.0  MCV 97.4  --   PLT 185  --    Basic Metabolic Panel: Recent Labs  Lab 03/06/20 0143 03/06/20 0242 03/06/20 0634  NA 140 138 141  K >7.5* 8.0* >7.5*  CL 101 104 102  CO2 24  --  25  GLUCOSE 174* 157* 58*  BUN 70* 66* 70*  CREATININE 10.11* 10.80* 9.93*  CALCIUM 7.1*  --  7.3*  PHOS  --   --  10.0*   GFR: Estimated Creatinine Clearance: 3.9 mL/min (A) (by C-G formula based on SCr of 9.93 mg/dL (H)). Liver Function Tests: Recent Labs  Lab 03/06/20 0634  ALBUMIN 3.2*   No results for input(s): LIPASE, AMYLASE in the last 168 hours. No results for input(s): AMMONIA in the last 168 hours. Coagulation Profile: No results for input(s): INR, PROTIME in the last 168 hours. Cardiac Enzymes: No results for input(s): CKTOTAL, CKMB, CKMBINDEX, TROPONINI in the last 168 hours. BNP (last 3 results) No results for input(s): PROBNP in the last 8760 hours. HbA1C: No  results for input(s): HGBA1C in the last 72 hours. CBG: No results for input(s): GLUCAP in the last 168 hours. Lipid Profile: No results for input(s): CHOL, HDL, LDLCALC, TRIG, CHOLHDL, LDLDIRECT in the last 72 hours. Thyroid Function Tests: No results for input(s): TSH, T4TOTAL, FREET4, T3FREE, THYROIDAB in the last 72 hours. Anemia Panel: No results for input(s): VITAMINB12, FOLATE, FERRITIN, TIBC, IRON, RETICCTPCT in the last 72 hours. Sepsis Labs: No results for input(s): PROCALCITON, LATICACIDVEN in the last 168 hours.  Recent Results (from the past 240 hour(s))  SARS  Coronavirus 2 by RT PCR (hospital order, performed in [facility] hospital lab) Nasopharyngeal Nasopharyngeal Swab     Status: None   Collection Time: 03/06/20  2:33 AM   Specimen: Nasopharyngeal Swab  Result Value Ref Range Status   SARS Coronavirus 2 NEGATIVE NEGATIVE Final    Comment: (NOTE) SARS-CoV-2 target nucleic acids are NOT DETECTED.  The SARS-CoV-2 RNA is generally detectable in upper and lower respiratory specimens during the acute phase of infection. The lowest concentration of SARS-CoV-2 viral copies this assay can detect is 250 copies / mL. A negative result does not preclude SARS-CoV-2 infection and should not be used as the sole basis for treatment or other patient management decisions.  A negative result may occur with improper specimen collection / handling, submission of specimen other than nasopharyngeal swab, presence of viral mutation(s) within the areas targeted by this assay, and inadequate number of viral copies (<250 copies / mL). A negative result must be combined with clinical observations, patient history, and epidemiological information.  Fact Sheet for Patients:   StrictlyIdeas.no  Fact Sheet for Healthcare Providers: BankingDealers.co.za  This test is not yet approved or  cleared by the [place] FDA and has been authorized for detection and/or diagnosis of SARS-CoV-2 by FDA under an Emergency Use Authorization (EUA).  This EUA will remain in effect (meaning this test can be used) for the duration of the COVID-19 declaration under Section 564(b)(1) of the Act, 21 U.S.C. section 360bbb-3(b)(1), unless the authorization is terminated or revoked sooner.  Performed at [facility], [place] [address] [place], [place] [address]          Radiology Studies: DG Chest 2 View  Result Date: 03/06/2020 CLINICAL DATA:  Shortness of breath EXAM: CHEST - 2 VIEW COMPARISON:  November 21, 2019 FINDINGS: The  heart size and mediastinal contours are unchanged with mild cardiomegaly. Again noted is a right-sided MediPort catheter with the tip at the superior cavoatrial junction and a left-sided central venous catheter. No large airspace consolidation or pleural effusion. Subsegmental atelectasis seen at both lung bases. A hiatal hernia is present. No acute osseous abnormality. IMPRESSION: Subsegmental atelectasis. Stable large hiatal hernia. Electronically Signed   By: Prudencio Pair M.D.   On: 03/06/2020 02:16        Scheduled Meds: . Chlorhexidine Gluconate Cloth  6 each Topical Q0600  . heparin sodium (porcine)      . ipratropium-albuterol  3 mL Nebulization Q6H  . ipratropium-albuterol      . methylPREDNISolone (SOLU-MEDROL) injection  60 mg Intravenous BID  . sodium chloride flush  3 mL Intravenous Once   Continuous Infusions: . sodium chloride    . sodium chloride    . doxycycline (VIBRAMYCIN) IV       LOS: 0 days   Time spent: 59min  Jaquila Santelli C Deserae Jennings, DO [facility]  If 7PM-7AM, please contact night-coverage [email or web address]  03/06/2020, 8:16 AM

## 2020-03-06 NOTE — Progress Notes (Signed)
RT set up patients BIPAP HS at beside. Pt not ready for BIPAP yet will call when ready.

## 2020-03-06 NOTE — ED Notes (Signed)
RN attempted to call report to 6E. This RN will try back in 10 mins.

## 2020-03-06 NOTE — Progress Notes (Signed)
Patient ID: GRAYCEE GREESON, female   DOB: 09-25-1945, 74 y.o.   MRN: [id number]  Called by Dr.Delo regarding Ms.Simona Huh (MWF ESRD patient at [facility]) who presented to the ER overnight with complaints of cough, shortness of breath and weakness after missing HD on 03/03/2020. She is found to have a K of 8.0 with EKG changes. Will order for emergency HD and per discussion with Dr.Delo, have her sent back to the ER after dialysis for re-evaluation to see if she needs admission or may be discharged home.   Dialysis RN alerted.   Elmarie Shiley MD [facility]. Office # [phone] 3:51 AM

## 2020-03-06 NOTE — H&P (Signed)
History and Physical    [name]:[id number] DOB: 21-Jan-1946 DOA: 03/06/2020  PCP: [name], MD  Patient coming from: Home   Chief Complaint:  Chief Complaint  Patient presents with   Cough     HPI:    74 year old female with past medical history of end-stage renal disease (MWF schedule), coronary artery disease, obstructive sleep apnea (on BIPAP QHS), hyperlipidemia, gastroesophageal reflux disease, diabetes mellitus type 2, obesity, COPD, atrial fibrillation who presents to [facility] emergency department with complaints of shortness of breath.  Patient explains that approximately 1 week ago she began to develop shortness of breath.  Initially the shortness of breath was mild in intensity but progressively worsened as the days followed.  The shortness of breath was worse with exertion and improved with rest.  Shortness of breath is associated with cough, intermittently productive with clear to white sputum.  Patient also complains of intermittent chest discomfort that she describes as midsternal in location moderate intensity and occurring with deep inspiration or cough.  Patient states that as her symptoms worsened she developed associated generalized weakness and poor appetite.  Because of the patient's worsening symptoms she missed her Friday hemodialysis session.  Upon further questioning patient denies recent travel, sick contacts or confirmed contacts with COVID-19 infection.  Patient shortness of breath and cough continue to worsen and so she eventually presented to [facility] emergency department for evaluation.  Upon evaluation in the emergency department patient was found to have a markedly elevated potassium of 8.0 with peaked T waves on EKG.  Case was discussed with Dr. Posey Pronto with nephrology was arranged for emergent dialysis.  The hospitalist group is now been called to assess the patient for admission the hospital    Review of  Systems: A 10-system review of systems has been performed and all systems are negative with the exception of what is listed in the HPI.    Past Medical History:  Diagnosis Date   Blood transfusion without reported diagnosis    CAD (coronary artery disease)    Nonobstructive at cardiac catheterization 2000   Cataract    Cervical cancer ([place]) 1978   CHF (congestive heart failure) (HCC)    Chronic back pain    Chronic kidney disease    MWF dialysis   COPD (chronic obstructive pulmonary disease) (HCC)    Degenerative disc disease    Dysrhythmia    a-fib   Essential hypertension, benign    GERD (gastroesophageal reflux disease)    Gout    Hiatal hernia 07/27/2013   History of diverticulitis of colon    History of hiatal hernia    Iron deficiency anemia    Irritable bowel syndrome    Lumbar radiculopathy    Mixed hyperlipidemia    Moderate major depression, single episode ([place]) 07/21/2019   Neuropathy    Osteoporosis    Ovarian cancer ([place]) 1978   patient denies. States this was cervical cancer   Oxygen deficiency    room air   Sleep apnea    Type 2 diabetes mellitus ([place])    Vitamin B deficiency 12/25/2009   Vitamin B12 deficiency     Past Surgical History:  Procedure Laterality Date   ABDOMINAL HYSTERECTOMY     AV FISTULA PLACEMENT Left 08/05/2019   Procedure: ARTERIOVENOUS (AV) FISTULA CREATION LEFT ARM;  Surgeon: Waynetta Sandy, MD;  Location: [place];  Service: Vascular;  Laterality: Left;   [place]  TRANSPOSITION Left 10/05/2019   Procedure: SECOND STAGE LEFT BASCILIC VEIN TRANSPOSITION;  Surgeon: Waynetta Sandy, MD;  Location: [place];  Service: Vascular;  Laterality: Left;   Benign breast cysts     CHOLECYSTECTOMY     COLONOSCOPY  10/01/2006   SLF:Pan colonic diverticulosis and moderate internal hemorrhoids/ Otherwise no polyps, masses, inflammatory changes or AVMs/   COLONOSCOPY  2011    SLF: pancolonic diverticulosis, large internal hemorrhoids   COLONOSCOPY N/A 01/26/2016   Procedure: COLONOSCOPY;  Surgeon: [name], MD;  [facility];  Service: Endoscopy;  Laterality: N/A;  830    COLONOSCOPY WITH PROPOFOL N/A 02/10/2020   Procedure: COLONOSCOPY WITH PROPOFOL;  Surgeon: Daneil Dolin, MD;  [facility];  Service: Endoscopy;  Laterality: N/A;  10:45am   ESOPHAGOGASTRODUODENOSCOPY  11/19/2006   SLF: Large hiatal hernia without evidence of Cameron ulcers/. Distal esophageal stricture, which allowed the gastroscope to pass without resistance.  A 16 mm Savary later passed with mild resistance/ Normal stomach.sb bx negative   ESOPHAGOGASTRODUODENOSCOPY  10/01/2006   FYB:OFBPZ hiatal hernia.  Distal esophagus without evidence of   erythema, ulceration or Barrett's esophagus   ESOPHAGOGASTRODUODENOSCOPY  2011   SLF: large hh, distal esophageal web narrowing to 4mm s/p dilation to 23mm   ESOPHAGOGASTRODUODENOSCOPY N/A 08/06/2013   SLF: 1. Stricture at the gastroesophageal junction 2. large hiatal hernia. 3. Mild erosive gastritis.   ESOPHAGOGASTRODUODENOSCOPY (EGD) WITH PROPOFOL N/A 07/19/2019   rourk: Mild erosive reflux esophagitis.  Mild Schatzki ring status post dilation.  Large hiatal hernia with at least one half of the stomach above the diaphragm.  Gastric mucosa erythematous.   GIVENS CAPSULE STUDY N/A 08/06/2013   INCOMPLETE-SMALL BOWLE ULCERS   IR FLUORO GUIDE CV LINE LEFT  07/29/2019   IR US GUIDE VASC ACCESS LEFT  07/29/2019   KNEE SURGERY Right    PARTIAL HYSTERECTOMY  1978   small bowel capsule  2008   negative   SPINE SURGERY     TONSILLECTOMY AND ADENOIDECTOMY     Two back surgeries/fusion     UMBILICAL HERNIA REPAIR  2010     reports that she quit smoking about 58 years ago. Her smoking use included cigarettes. She started smoking about 59 years ago. She has a 1.00 pack-year smoking history. She has never used  smokeless tobacco. She reports that she does not drink alcohol and does not use drugs.  No Known Allergies  Family History  Problem Relation Age of Onset   Colon cancer Brother        diagnosed age 16. Living.    Ulcers Sister    Diabetes Sister    Heart attack Sister    Kidney failure Sister    Stroke Sister    Ulcers Mother    Diabetes Mother    Heart attack Mother    Stroke Mother    Asthma Mother    Heart disease Mother    Cervical cancer Mother    Heart attack Brother    Heart disease Brother    Asthma Sister    Diabetes Brother    Stroke Maternal Grandmother    Heart attack Maternal Grandmother    Heart attack Other    Early death Father        MVA in his 30s     Prior to Admission medications   Medication Sig Start Date End Date Taking? Authorizing Provider  acetaminophen (TYLENOL) 500 MG tablet Take 500-1,000 mg by mouth every  6 (six) hours as needed (for pain.).   Yes [provider]  albuterol (PROVENTIL) (2.5 MG/3ML) 0.083% nebulizer solution Inhale 3 mLs into the lungs daily as needed for wheezing or shortness of breath. 12/22/19  Yes [provider]  albuterol (VENTOLIN HFA) 108 (90 Base) MCG/ACT inhaler Inhale 2 puffs into the lungs every 6 (six) hours as needed for wheezing or shortness of breath.  04/09/19  Yes [provider]  allopurinol (ZYLOPRIM) 100 MG tablet Take 1 tablet (100 mg total) by mouth 2 (two) times daily. 08/09/19  Yes Nita Sells, MD  amiodarone (PACERONE) 200 MG tablet Take 1 tablet (200 mg total) by mouth daily. 08/10/19  Yes Nita Sells, MD  apixaban (ELIQUIS) 2.5 MG TABS tablet Take 2.5 mg by mouth 2 (two) times daily.   Yes [provider]  aspirin EC 81 MG tablet Take 81 mg by mouth daily.   Yes [provider]  atorvastatin (LIPITOR) 40 MG tablet Take 1 tablet (40 mg total) by mouth at bedtime. 05/04/18  Yes Satira Sark, MD  Biotin 5000 MCG TABS Take  5,000 mcg by mouth daily.   Yes [provider]  Carboxymethylcellul-Glycerin (CLEAR EYES FOR DRY EYES) 1-0.25 % SOLN Place 1 drop into both eyes daily as needed (dry eys).   Yes [provider]  esomeprazole (NEXIUM) 40 MG capsule Take 40 mg by mouth daily. 12/06/19  Yes [provider]  famotidine (PEPCID) 40 MG tablet TAKE 1 TABLET AT BEDTIME AS NEEDED TO CONTROL REFLUX Patient taking differently: Take 40 mg by mouth at bedtime as needed for heartburn.  06/23/15  Yes Mannam, Praveen, MD  gabapentin (NEURONTIN) 100 MG capsule Take 100 mg by mouth at bedtime. 12/29/19  Yes [provider]  insulin lispro (HUMALOG) 100 UNIT/ML injection Inject 5-16 Units into the skin 3 (three) times daily as needed (greater than 120).    Yes [provider]  LANTUS SOLOSTAR 100 UNIT/ML SOPN Inject 10 Units into the skin daily as needed (over 120 blood sugars).  10/06/12  Yes [provider]  lidocaine (LIDODERM) 5 % Place 1 patch onto the skin daily. Remove & Discard patch within 12 hours or as directed by MD Patient taking differently: Place 1 patch onto the skin daily as needed (pain.). Remove & Discard patch within 12 hours or as directed by MD 11/21/19  Yes Garald Balding, PA-C  nitroGLYCERIN (NITROSTAT) 0.4 MG SL tablet Place 0.4 mg under the tongue every 5 (five) minutes x 3 doses as needed for chest pain. 12/07/19  Yes [provider]  ondansetron (ZOFRAN) 4 MG tablet Take 4 mg by mouth daily as needed for nausea/vomiting. 12/29/19  Yes [provider]  tamsulosin (FLOMAX) 0.4 MG CAPS capsule Take 1 capsule (0.4 mg total) by mouth daily. 07/08/19  Yes Johnson, Clanford L, MD  TRELEGY ELLIPTA 100-62.5-25 MCG/INH AEPB Inhale 1 puff into the lungs daily as needed (respiratory issues.).    Yes [provider]    Physical Exam: Vitals:   03/06/20 0414 03/06/20 0455 03/06/20 0500 03/06/20 0510  BP: (!) 110/46 (!) 105/33 (!) 105/33   Pulse:  50 54 54 (!) 106  Resp: 22 21 19 18   Temp:      TempSrc:      SpO2: 99% 100%  100%  Weight:      Height:        Constitutional: Acute alert and oriented x3, patient is in respiratory distress. Skin: no rashes,  no lesions, good skin turgor noted. Eyes: Pupils are equally reactive to light.  No evidence of scleral icterus or conjunctival pallor.  ENMT: Moist mucous membranes noted.  Posterior pharynx clear of any exudate or lesions.   Neck: normal, supple, no masses, no thyromegaly.  No evidence of jugular venous distension.   Respiratory: Coarse breath sounds in all fields with expiratory wheezing and prolonged expiratory phase.  Diminished breath sounds at the bases with bibasilar and mid field rales.  Notable increased work of breathing without accessory muscle use.   Cardiovascular: Bradycardic rate with regular rhythm.  No murmurs / rubs / gallops. No extremity edema. 2+ pedal pulses. No carotid bruits.  Chest:   Notable mild anterior chest wall tenderness without crepitus or deformity. Back:   Nontender without crepitus or deformity. Abdomen: Abdomen is soft and nontender.  No evidence of intra-abdominal masses.  Positive bowel sounds noted in all quadrants.   Musculoskeletal: No joint deformity upper and lower extremities. Good ROM, no contractures. Normal muscle tone.  Neurologic: Patient is lethargic but arousable.  Patient is following all commands.. Sensation intact, patient is moving all 4 extremities spontaneously.  Patient is following all commands.  Patient is responsive to verbal stimuli.   Psychiatric: Patient exhibits a depressed mood with flat affect.  Patient currently does possess insight as to her current situation.   Labs on Admission: I have personally reviewed following labs and imaging studies -   CBC: Recent Labs  Lab 03/06/20 0143 03/06/20 0242  WBC 7.4  --   HGB 11.8* 12.6  HCT 38.0 37.0  MCV 97.4  --   PLT 185  --    Basic Metabolic Panel: Recent Labs   Lab 03/06/20 0143 03/06/20 0242  NA 140 138  K >7.5* 8.0*  CL 101 104  CO2 24  --   GLUCOSE 174* 157*  BUN 70* 66*  CREATININE 10.11* 10.80*  CALCIUM 7.1*  --    GFR: Estimated Creatinine Clearance: 3.6 mL/min (A) (by C-G formula based on SCr of 10.8 mg/dL (H)). Liver Function Tests: No results for input(s): AST, ALT, ALKPHOS, BILITOT, PROT, ALBUMIN in the last 168 hours. No results for input(s): LIPASE, AMYLASE in the last 168 hours. No results for input(s): AMMONIA in the last 168 hours. Coagulation Profile: No results for input(s): INR, PROTIME in the last 168 hours. Cardiac Enzymes: No results for input(s): CKTOTAL, CKMB, CKMBINDEX, TROPONINI in the last 168 hours. BNP (last 3 results) No results for input(s): PROBNP in the last 8760 hours. HbA1C: No results for input(s): HGBA1C in the last 72 hours. CBG: No results for input(s): GLUCAP in the last 168 hours. Lipid Profile: No results for input(s): CHOL, HDL, LDLCALC, TRIG, CHOLHDL, LDLDIRECT in the last 72 hours. Thyroid Function Tests: No results for input(s): TSH, T4TOTAL, FREET4, T3FREE, THYROIDAB in the last 72 hours. Anemia Panel: No results for input(s): VITAMINB12, FOLATE, FERRITIN, TIBC, IRON, RETICCTPCT in the last 72 hours. Urine analysis:    Component Value Date/Time   COLORURINE YELLOW 07/22/2019 1038   APPEARANCEUR TURBID (A) 07/22/2019 1038   LABSPEC >1.030 (H) 07/22/2019 1038   PHURINE 6.0 07/22/2019 1038   GLUCOSEU NEGATIVE 07/22/2019 1038   HGBUR SMALL (A) 07/22/2019 1038   BILIRUBINUR SMALL (A) 07/22/2019 1038   KETONESUR TRACE (A) 07/22/2019 1038   PROTEINUR 100 (A) 07/22/2019 1038   UROBILINOGEN 0.2 01/29/2007 1500   NITRITE NEGATIVE 07/22/2019 1038   LEUKOCYTESUR MODERATE (A) 07/22/2019 1038    Radiological Exams on  Admission - Personally Reviewed: DG Chest 2 View  Result Date: 03/06/2020 CLINICAL DATA:  Shortness of breath EXAM: CHEST - 2 VIEW COMPARISON:  November 21, 2019 FINDINGS: The  heart size and mediastinal contours are unchanged with mild cardiomegaly. Again noted is a right-sided MediPort catheter with the tip at the superior cavoatrial junction and a left-sided central venous catheter. No large airspace consolidation or pleural effusion. Subsegmental atelectasis seen at both lung bases. A hiatal hernia is present. No acute osseous abnormality. IMPRESSION: Subsegmental atelectasis. Stable large hiatal hernia. Electronically Signed   By: Prudencio Pair M.D.   On: 03/06/2020 02:16    EKG: Personally reviewed.  Rhythm is junctional rhythm with left bundle branch block and heart rate of 55 bpm.    Assessment/Plan Active Problems:   Acute hyperkalemia   Patient presenting with severe hyperkalemia with EKG changes in the setting of missed dialysis  Case is already been discussed with Dr. Posey Pronto by the emergency department staff and patient is to undergo emergent dialysis this morning.  Additionally patient is already received cocktail of potassium shifting medications to temporize patient until hemodialysis can be started.  Considering patient's concurrent COPD exacerbation/respiratory distress will admit to stepdown unit after hemodialysis is complete.  Monitoring potassium levels with serial chemistries  Monitoring patient on telemetry  COPD with acute exacerbation [facility])     Patient presenting with respiratory distress, likely multifactorial in origin  Patient is likely suffering primarily from COPD exacerbation and acute bacterial bronchitis  However considering patient's recent missed dialysis there is also a component of mild acute on chronic diastolic congestive heart failure/volume overload.   Considering severe acute hyperkalemia, patient was undergoing emergent hemodialysis which should address both hyperkalemia and volume overload to an extent.   Additionally treating patient with aggressive bronchodilator therapy, intravenous systemic  steroids  Additionally providing patient with intravenous doxycycline due to concerns for component of acute bacterial bronchitis.    Patient is a known history of chronic respiratory failure on 2 L of oxygen at baseline, will target pulse oximetry of 88 to 92%.   Acute on chronic diastolic CHF (congestive heart failure) ([place])   See assessment and plan above.      Benign essential HTN   Continue home regimen of antihypertensive therapy    Gout   Continue allopurinol    Chronic respiratory failure with hypoxia (HCC)   Patient has longstanding known history of chronic respiratory failure on 2 L of oxygen via nasal cannula at baseline  No clinical evidence of acute or chronic failure at this time.    Acute bacterial bronchitis   See assessment and plan above    Mixed diabetic hyperlipidemia associated with type 2 diabetes mellitus ([place])   Continue home regimen of statin therapy    Chronic gout 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   Continue home regimen of allopurinol    GERD with esophagitis  Continue proton pump inhibitor therapy  Diabetes mellitus type 2 with end-stage renal disease on long-term insulin therapy   Accu-Cheks before every meal and nightly with sliding scale insulin  Hemoglobin A1c pending  Lantus 10 units daily    Code Status:  Full code Family Communication: deferred   Status is: Observation  The patient remains OBS appropriate and will d/c before 2 midnights.  Dispo: The patient is from: Home              Anticipated d/c is to: Home  Anticipated d/c date is: 2 days              Patient currently is not medically stable to d/c.        Vernelle Emerald MD [facility] Pager [phone]  If 7PM-7AM, please contact night-coverage [email or web address] Use universal [place] password for that web site. If you do not have the password, please call the hospital operator.  03/06/2020, 5:27 AM

## 2020-03-06 NOTE — ED Provider Notes (Signed)
[facility] EMERGENCY DEPARTMENT Provider Note   CSN: [id number] Arrival date & time: 03/06/20  0110     History Chief Complaint  Patient presents with  . Cough    [name] is a 74 y.o. female.  Patient is a 74 year old female with extensive past medical history including coronary artery disease, congestive heart failure, and end-stage renal disease on hemodialysis.  Patient presents today with complaints of shortness of breath.  This began earlier today and is worsening.  She tells me she missed her dialysis session this past Friday.  She denies fevers or chills, but does describe some nonproductive cough.  She denies any contacts with Covid positive individuals and tells me she received both doses of her vaccine several months ago.  The history is provided by the patient.  Cough Cough characteristics:  Non-productive Severity:  Moderate Onset quality:  Sudden Timing:  Constant Progression:  Worsening Chronicity:  New Worsened by:  Nothing Ineffective treatments:  None tried Associated symptoms: shortness of breath   Associated symptoms: no chills and no fever        Past Medical History:  Diagnosis Date  . Blood transfusion without reported diagnosis   . CAD (coronary artery disease)    Nonobstructive at cardiac catheterization 2000  . Cataract   . Cervical cancer ([place]) 1978  . CHF (congestive heart failure) ([place])   . Chronic back pain   . Chronic kidney disease    MWF dialysis  . COPD (chronic obstructive pulmonary disease) ([place])   . Degenerative disc disease   . Dysrhythmia    a-fib  . Essential hypertension, benign   . GERD (gastroesophageal reflux disease)   . Gout   . Hiatal hernia 07/27/2013  . History of diverticulitis of colon   . History of hiatal hernia   . Iron deficiency anemia   . Irritable bowel syndrome   . Lumbar radiculopathy   . Mixed hyperlipidemia   . Moderate major depression, single episode ([place]) 07/21/2019    . Neuropathy   . Osteoporosis   . Ovarian cancer [facility]) 1978   patient denies. States this was cervical cancer  . Oxygen deficiency    room air  . Sleep apnea   . Type 2 diabetes mellitus ([place])   . Vitamin B deficiency 12/25/2009  . Vitamin B12 deficiency     Patient Active Problem List   Diagnosis Date Noted  . Loss of weight 10/22/2019  . Acute renal failure (ARF) ([place]) 07/25/2019  . Goals of care, counseling/discussion 07/23/2019  . Acute renal failure superimposed on stage 4 chronic kidney disease ([place]) 07/22/2019  . 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 07/21/2019  . Paroxysmal atrial fibrillation ([place]) 07/21/2019  . Encephalopathy 07/21/2019  . Moderate major depression, single episode ([place]) 07/21/2019  . Pressure injury of skin 07/20/2019  . GI bleed 07/18/2019  . Acute UTI 07/18/2019  . Coffee ground emesis 07/17/2019  . Anorexia 07/10/2019  . Urinary retention, Female 07/07/2019  . Elevated troponin 07/07/2019  . AKI (acute kidney injury) ([place]) 07/06/2019  . Hypoxia 07/05/2019  . Weight gain with edema 07/02/2019  . 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 CKD stage 3 due to type 2 diabetes mellitus ([place]) 06/22/2019  . Neuropathy due to type 2 diabetes mellitus ([place]) 06/22/2019  . Dyslipidemia associated with type 2 diabetes mellitus ([place]) 06/22/2019  . Chronic gout 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  06/22/2019  . Quality of life palliative care encounter 06/19/2019  . Atrial fibrillation with RVR ([place])   . Atrial flutter with rapid ventricular response ([place]) 06/16/2019  . Dyspnea and respiratory abnormalities 06/01/2019  . Acute on chronic respiratory failure with hypoxia and hypercapnia with respiratory acidosis 06/01/2019  . Anasarca 05/31/2019  . Nausea without vomiting 05/31/2019  . Generalized abdominal pain 05/31/2019  . History of colonic polyps  05/31/2019  . Lymphedema of both lower extremities 04/28/2019  . Chronic respiratory failure with hypoxia ([place]) 07/13/2018  . Acute renal failure superimposed on stage 3 chronic kidney disease ([place]) 07/13/2018  . Chronic diastolic CHF (congestive heart failure) ([place]) 07/13/2018  . COPD (chronic obstructive pulmonary disease) ([place]) 07/13/2018  . COPD exacerbation ([place]) 07/12/2018  . Flatulence 09/18/2017  . IBS (irritable bowel syndrome) 04/03/2017  . Gout 03/15/2017  . Acute on chronic diastolic CHF (congestive heart failure) ([place]) 05/08/2016  . CKD (chronic kidney disease) stage 3, GFR 30-59 ml/min 05/08/2016  . COPD with acute exacerbation ([place]) 05/08/2016  . Constipation 04/25/2016  . Peripheral vertigo 06/27/2015  . Port-A-Cath in place 10/06/2014  . Chronic obstructive pulmonary disease ([place]) 04/30/2014  . Obesity, Class III, BMI 40-49.9 (morbid obesity) ([place]) 04/30/2014  . Hemorrhoids, internal, with bleeding 11/10/2013  . Anemia 08/18/2013  . Hiatal hernia with GERD and esophagitis   . Obstructive chronic bronchitis without exacerbation ([place]) 12/26/2009  . Dysphagia 12/26/2009  . DM type 2 with diabetic peripheral neuropathy ([place]) 12/25/2009  . Vitamin B deficiency 12/25/2009  . Iron deficiency anemia due to chronic blood loss 12/25/2009  . Benign essential HTN 12/25/2009  . DEGENERATIVE DISC DISEASE 12/25/2009  . OSA on CPAP 12/25/2009  . HLD (hyperlipidemia) 12/25/2009  . Type 2 diabetes with nephropathy ([place]) 12/25/2009    Past Surgical History:  Procedure Laterality Date  . ABDOMINAL HYSTERECTOMY    . AV FISTULA PLACEMENT Left 08/05/2019   Procedure: ARTERIOVENOUS (AV) FISTULA CREATION LEFT ARM;  Surgeon: Waynetta Sandy, MD;  Location: [facility];  Service: Vascular;  Laterality: Left;  . BACK SURGERY    . BASCILIC VEIN TRANSPOSITION Left 10/05/2019   Procedure: SECOND STAGE LEFT BASCILIC VEIN TRANSPOSITION;  Surgeon: Waynetta Sandy, MD;  Location: [facility];  Service: Vascular;  Laterality: Left;  . Benign breast cysts    . CHOLECYSTECTOMY    . COLONOSCOPY  10/01/2006   SLF:Pan colonic diverticulosis and moderate internal hemorrhoids/ Otherwise no polyps, masses, inflammatory changes or AVMs/  . COLONOSCOPY  2011   SLF: pancolonic diverticulosis, large internal hemorrhoids  . COLONOSCOPY N/A 01/26/2016   Procedure: COLONOSCOPY;  Surgeon: [name], MD;  [facility];  Service: Endoscopy;  Laterality: N/A;  830   . COLONOSCOPY WITH PROPOFOL N/A 02/10/2020   Procedure: COLONOSCOPY WITH PROPOFOL;  Surgeon: Daneil Dolin, MD;  [facility];  Service: Endoscopy;  Laterality: N/A;  10:45am  . ESOPHAGOGASTRODUODENOSCOPY  11/19/2006   SLF: Large hiatal hernia without evidence of Cameron ulcers/. Distal esophageal stricture, which allowed the gastroscope to pass without resistance.  A 16 mm Savary later passed with mild resistance/ Normal stomach.sb bx negative  . ESOPHAGOGASTRODUODENOSCOPY  10/01/2006   FWY:OVZCH hiatal hernia.  Distal esophagus without evidence of   erythema, ulceration or Barrett's esophagus  . ESOPHAGOGASTRODUODENOSCOPY  2011   SLF: large hh, distal esophageal web narrowing to 88mm s/p dilation to 48mm  . ESOPHAGOGASTRODUODENOSCOPY N/A 08/06/2013   SLF: 1. Stricture at the gastroesophageal junction 2. large hiatal  hernia. 3. Mild erosive gastritis.  Marland Kitchen ESOPHAGOGASTRODUODENOSCOPY (EGD) WITH PROPOFOL N/A 07/19/2019   rourk: Mild erosive reflux esophagitis.  Mild Schatzki ring status post dilation.  Large hiatal hernia with at least one half of the stomach above the diaphragm.  Gastric mucosa erythematous.  Marland Kitchen GIVENS CAPSULE STUDY N/A 08/06/2013   INCOMPLETE-SMALL BOWLE ULCERS  . IR FLUORO GUIDE CV LINE LEFT  07/29/2019  . IR US GUIDE VASC ACCESS LEFT  07/29/2019  . KNEE SURGERY Right   . PARTIAL HYSTERECTOMY  1978  . small bowel capsule  2008   negative  . SPINE SURGERY    . TONSILLECTOMY AND ADENOIDECTOMY     . Two back surgeries/fusion    . UMBILICAL HERNIA REPAIR  2010     OB History   No obstetric history on file.     Family History  Problem Relation Age of Onset  . Colon cancer Brother        diagnosed age 3. Living.   Marland Kitchen Ulcers Sister   . Diabetes Sister   . Heart attack Sister   . Kidney failure Sister   . Stroke Sister   . Ulcers Mother   . Diabetes Mother   . Heart attack Mother   . Stroke Mother   . Asthma Mother   . Heart disease Mother   . Cervical cancer Mother   . Heart attack Brother   . Heart disease Brother   . Asthma Sister   . Diabetes Brother   . Stroke Maternal Grandmother   . Heart attack Maternal Grandmother   . Heart attack Other   . Early death Father        MVA in his 81s    Social History   Tobacco Use  . Smoking status: Former Smoker    Packs/day: 1.00    Years: 1.00    Pack years: 1.00    Types: Cigarettes    Start date: 02/19/1961    Quit date: 08/05/1961    Years since quitting: 58.6  . Smokeless tobacco: Never Used  . Tobacco comment: Quit smoking x 50 years  Vaping Use  . Vaping Use: Never used  Substance Use Topics  . Alcohol use: No  . Drug use: No    Home Medications Prior to Admission medications   Medication Sig Start Date End Date Taking? Authorizing Provider  acetaminophen (TYLENOL) 500 MG tablet Take 500-1,000 mg by mouth every 6 (six) hours as needed (for pain.).    [provider]  albuterol (PROVENTIL) (2.5 MG/3ML) 0.083% nebulizer solution Inhale 3 mLs into the lungs daily as needed for wheezing or shortness of breath. 12/22/19   [provider]  albuterol (VENTOLIN HFA) 108 (90 Base) MCG/ACT inhaler Inhale 2 puffs into the lungs every 6 (six) hours as needed for wheezing or shortness of breath.  04/09/19   [provider]  allopurinol (ZYLOPRIM) 100 MG tablet Take 1 tablet (100 mg total) by mouth 2 (two) times daily. 08/09/19   Nita Sells, MD  amiodarone (PACERONE) 200 MG tablet  Take 1 tablet (200 mg total) by mouth daily. 08/10/19   Nita Sells, MD  apixaban (ELIQUIS) 2.5 MG TABS tablet Take 2.5 mg by mouth 2 (two) times daily.    [provider]  aspirin EC 81 MG tablet Take 81 mg by mouth daily.    [provider]  atorvastatin (LIPITOR) 40 MG tablet Take 1 tablet (40 mg total) by mouth at bedtime. 05/04/18   [name]  G, MD  Biotin 5000 MCG TABS Take 5,000 mcg by mouth daily.    [provider]  esomeprazole (NEXIUM) 40 MG capsule Take 40 mg by mouth daily. 12/06/19   [provider]  famotidine (PEPCID) 40 MG tablet TAKE 1 TABLET AT BEDTIME AS NEEDED TO CONTROL REFLUX Patient taking differently: Take 40 mg by mouth at bedtime.  06/23/15   Mannam, Hart Robinsons, MD  gabapentin (NEURONTIN) 100 MG capsule Take 100 mg by mouth at bedtime. 12/29/19   [provider]  insulin lispro (HUMALOG) 100 UNIT/ML injection Inject 5-16 Units into the skin 3 (three) times daily as needed (greater than 120).     [provider]  LANTUS SOLOSTAR 100 UNIT/ML SOPN Inject 10 Units into the skin daily as needed (over 120 blood sugars).  10/06/12   [provider]  lidocaine (LIDODERM) 5 % Place 1 patch onto the skin daily. Remove & Discard patch within 12 hours or as directed by MD Patient taking differently: Place 1 patch onto the skin daily as needed (pain.). Remove & Discard patch within 12 hours or as directed by MD 11/21/19   Garald Balding, PA-C  nitroGLYCERIN (NITROSTAT) 0.4 MG SL tablet Place 0.4 mg under the tongue every 5 (five) minutes x 3 doses as needed for chest pain. 12/07/19   [provider]  ondansetron (ZOFRAN) 4 MG tablet Take 4 mg by mouth daily as needed for nausea/vomiting. 12/29/19   [provider]  polyethylene glycol-electrolytes (TRILYTE) 420 g solution Take 4,000 mLs by mouth as directed. 02/03/20   Rourk, Cristopher Estimable, MD  tamsulosin (FLOMAX) 0.4 MG CAPS capsule Take 1 capsule (0.4 mg  total) by mouth daily. 07/08/19   Johnson, Clanford L, MD  TRELEGY ELLIPTA 100-62.5-25 MCG/INH AEPB Inhale 1 puff into the lungs daily as needed (respiratory issues.).     [provider]    Allergies    Patient has no known allergies.  Review of Systems   Review of Systems  Constitutional: Negative for chills and fever.  Respiratory: Positive for cough and shortness of breath.   All other systems reviewed and are negative.   Physical Exam Updated Vital Signs BP (!) 115/43 (BP Location: Right Arm)   Pulse 86   Temp 97.6 F (36.4 C) (Oral)   Resp 20   Ht 4\' 8"  (1.422 m)   Wt 72.6 kg   LMP  (LMP Unknown)   SpO2 100%   BMI 35.87 kg/m   Physical Exam Vitals and nursing note reviewed.  Constitutional:      General: She is not in acute distress.    Appearance: She is well-developed. She is not diaphoretic.  HENT:     Head: Normocephalic and atraumatic.  Cardiovascular:     Rate and Rhythm: Normal rate and regular rhythm.     Heart sounds: No murmur heard.  No friction rub. No gallop.   Pulmonary:     Effort: Pulmonary effort is normal. No respiratory distress.     Breath sounds: Wheezing present.  Abdominal:     General: Bowel sounds are normal. There is no distension.     Palpations: Abdomen is soft.     Tenderness: There is no abdominal tenderness.  Musculoskeletal:        General: Normal range of motion.     Cervical back: Normal range of motion and neck supple.     Right lower leg: Edema present.     Left lower leg: Edema present.  Comments: There is 2-3+ pitting edema of both lower extremities.  Skin:    General: Skin is warm and dry.  Neurological:     Mental Status: She is alert and oriented to person, place, and time.     ED Results / Procedures / Treatments   Labs (all labs ordered are listed, but only abnormal results are displayed) Labs Reviewed  SARS CORONAVIRUS 2 BY RT PCR ([facility])  BASIC  METABOLIC PANEL  CBC  BRAIN NATRIURETIC PEPTIDE  I-STAT CHEM 8, ED  TROPONIN I (HIGH SENSITIVITY)    EKG EKG Interpretation  Date/Time:  Monday March 06 2020 02:20:53 EDT Ventricular Rate:  42 PR Interval:    QRS Duration: 197 QT Interval:  528 QTC Calculation: 442 R Axis:   -80 Text Interpretation: Junctional Rhythm with widening of QRS Confirmed by Veryl Speak [phone]) on 03/06/2020 2:24:13 AM   Radiology DG Chest 2 View  Result Date: 03/06/2020 CLINICAL DATA:  Shortness of breath EXAM: CHEST - 2 VIEW COMPARISON:  November 21, 2019 FINDINGS: The heart size and mediastinal contours are unchanged with mild cardiomegaly. Again noted is a right-sided MediPort catheter with the tip at the superior cavoatrial junction and a left-sided central venous catheter. No large airspace consolidation or pleural effusion. Subsegmental atelectasis seen at both lung bases. A hiatal hernia is present. No acute osseous abnormality. IMPRESSION: Subsegmental atelectasis. Stable large hiatal hernia. Electronically Signed   By: Prudencio Pair M.D.   On: 03/06/2020 02:16    Procedures Procedures (including critical care time)  Medications Ordered in ED Medications  sodium chloride flush (NS) 0.9 % injection 3 mL (has no administration in time range)  albuterol (VENTOLIN HFA) 108 (90 Base) MCG/ACT inhaler 4 puff (has no administration in time range)    ED Course  I have reviewed the triage vital signs and the nursing notes.  Pertinent labs & imaging results that were available during my care of the patient were reviewed by me and considered in my medical decision making (see chart for details).    MDM Rules/Calculators/A&P  Patient is a 74 year old female with history of end-stage renal disease on hemodialysis.  She presents today for evaluation of weakness, cough, and wheezing.  Patient was not feeling well 2 days ago and skipped her dialysis appointment.  Her electrolytes today reveal a potassium  greater than 7.5.  She has EKG changes showing widening of her QRS complex with what appears to be a junctional rhythm and tall, peaked T waves.  Patient has been given D50 and insulin as well as calcium gluconate.  Care was discussed with Dr. Posey Pronto from nephrology.  He will make arrangements for the patient to go to dialysis emergently.  Patient to be admitted to the hospitalist service under the care of Dr. Cyd Silence.  CRITICAL CARE Performed by: Veryl Speak Total critical care time: 45 minutes Critical care time was exclusive of separately billable procedures and treating other patients. Critical care was necessary to treat or prevent imminent or life-threatening deterioration. Critical care was time spent personally by me on the following activities: development of treatment plan with patient and/or surrogate as well as nursing, discussions with consultants, evaluation of patient's response to treatment, examination of patient, obtaining history from patient or surrogate, ordering and performing treatments and interventions, ordering and review of laboratory studies, ordering and review of radiographic studies, pulse oximetry and re-evaluation of patient's condition.   Final Clinical Impression(s) / ED Diagnoses Final diagnoses:  None    Rx / DC Orders ED Discharge Orders    None       Veryl Speak, MD 03/06/20 [id number]

## 2020-03-06 NOTE — ED Notes (Signed)
Ria Bush, RN and Delo, MD notified of pt's critical I-STAT values.

## 2020-03-06 NOTE — ED Notes (Signed)
Lunch Tray Ordered @ 6546.

## 2020-03-06 NOTE — ED Triage Notes (Signed)
Patient reports cough/SOB, subjective fevers, N/V X1 week. Dialysis patient - MWF. Last went on Wednesday.

## 2020-03-07 LAB — MAGNESIUM: Magnesium: 2.1 mg/dL (ref 1.7–2.4)

## 2020-03-07 LAB — CBC WITH DIFFERENTIAL/PLATELET
Abs Immature Granulocytes: 0.01 10*3/uL (ref 0.00–0.07)
Basophils Absolute: 0 10*3/uL (ref 0.0–0.1)
Basophils Relative: 0 %
Eosinophils Absolute: 0 10*3/uL (ref 0.0–0.5)
Eosinophils Relative: 0 %
HCT: 32.8 % — ABNORMAL LOW (ref 36.0–46.0)
Hemoglobin: 10.5 g/dL — ABNORMAL LOW (ref 12.0–15.0)
Immature Granulocytes: 0 %
Lymphocytes Relative: 7 %
Lymphs Abs: 0.3 10*3/uL — ABNORMAL LOW (ref 0.7–4.0)
MCH: 30.3 pg (ref 26.0–34.0)
MCHC: 32 g/dL (ref 30.0–36.0)
MCV: 94.5 fL (ref 80.0–100.0)
Monocytes Absolute: 0 10*3/uL — ABNORMAL LOW (ref 0.1–1.0)
Monocytes Relative: 1 %
Neutro Abs: 3.7 10*3/uL (ref 1.7–7.7)
Neutrophils Relative %: 92 %
Platelets: 162 10*3/uL (ref 150–400)
RBC: 3.47 MIL/uL — ABNORMAL LOW (ref 3.87–5.11)
RDW: 14.6 % (ref 11.5–15.5)
WBC: 4 10*3/uL (ref 4.0–10.5)
nRBC: 0 % (ref 0.0–0.2)

## 2020-03-07 LAB — COMPREHENSIVE METABOLIC PANEL
ALT: 13 U/L (ref 0–44)
AST: 14 U/L — ABNORMAL LOW (ref 15–41)
Albumin: 3.1 g/dL — ABNORMAL LOW (ref 3.5–5.0)
Alkaline Phosphatase: 63 U/L (ref 38–126)
Anion gap: 11 (ref 5–15)
BUN: 24 mg/dL — ABNORMAL HIGH (ref 8–23)
CO2: 24 mmol/L (ref 22–32)
Calcium: 8.2 mg/dL — ABNORMAL LOW (ref 8.9–10.3)
Chloride: 99 mmol/L (ref 98–111)
Creatinine, Ser: 5.28 mg/dL — ABNORMAL HIGH (ref 0.44–1.00)
GFR calc Af Amer: 9 mL/min — ABNORMAL LOW (ref >60)
GFR calc non Af Amer: 7 mL/min — ABNORMAL LOW (ref >60)
Glucose, Bld: 133 mg/dL — ABNORMAL HIGH (ref 70–99)
Potassium: 5.8 mmol/L — ABNORMAL HIGH (ref 3.5–5.1)
Sodium: 134 mmol/L — ABNORMAL LOW (ref 135–145)
Total Bilirubin: 1.3 mg/dL — ABNORMAL HIGH (ref 0.3–1.2)
Total Protein: 6.1 g/dL — ABNORMAL LOW (ref 6.5–8.1)

## 2020-03-07 LAB — HEMOGLOBIN A1C
Hgb A1c MFr Bld: 5.6 % (ref 4.8–5.6)
Mean Plasma Glucose: 114.02 mg/dL

## 2020-03-07 LAB — GLUCOSE, CAPILLARY
Glucose-Capillary: 127 mg/dL — ABNORMAL HIGH (ref 70–99)
Glucose-Capillary: 131 mg/dL — ABNORMAL HIGH (ref 70–99)
Glucose-Capillary: 137 mg/dL — ABNORMAL HIGH (ref 70–99)
Glucose-Capillary: 211 mg/dL — ABNORMAL HIGH (ref 70–99)

## 2020-03-07 LAB — MRSA PCR SCREENING: MRSA by PCR: NEGATIVE

## 2020-03-07 MED ORDER — HEPARIN SODIUM (PORCINE) 1000 UNIT/ML IJ SOLN
INTRAMUSCULAR | Status: AC
  Administered 2020-03-07: 2900 [IU] via INTRAVENOUS_CENTRAL
  Filled 2020-03-07: qty 4

## 2020-03-07 MED ORDER — CHLORHEXIDINE GLUCONATE CLOTH 2 % EX PADS
6.0000 | MEDICATED_PAD | Freq: Every day | CUTANEOUS | Status: DC
  Administered 2020-03-08: 6 via TOPICAL

## 2020-03-07 MED ORDER — CALCIUM ACETATE (PHOS BINDER) 667 MG PO CAPS
1334.0000 mg | ORAL_CAPSULE | Freq: Three times a day (TID) | ORAL | Status: DC
  Administered 2020-03-07 – 2020-03-08 (×4): 1334 mg via ORAL
  Filled 2020-03-07 (×4): qty 2

## 2020-03-07 MED ORDER — SODIUM CHLORIDE 0.9 % IV SOLN
125.0000 mg | INTRAVENOUS | Status: DC
  Administered 2020-03-08: 125 mg via INTRAVENOUS
  Filled 2020-03-07 (×2): qty 10

## 2020-03-07 MED ORDER — RENA-VITE PO TABS
1.0000 | ORAL_TABLET | Freq: Every day | ORAL | Status: DC
  Administered 2020-03-07: 1 via ORAL
  Filled 2020-03-07: qty 1

## 2020-03-07 MED ORDER — NEPRO/CARBSTEADY PO LIQD
237.0000 mL | Freq: Two times a day (BID) | ORAL | Status: DC
  Administered 2020-03-07: 237 mL via ORAL

## 2020-03-07 NOTE — Progress Notes (Signed)
OT Cancellation Note  Patient Details Name: KARMON ANDIS MRN: [id number] DOB: 04/17/1946   Cancelled Treatment:    Reason Eval/Treat Not Completed: Patient at procedure or test/ unavailable (Pt at HD at this time. OTR to follow-up for OT Eval.)  Jefferey Pica, OTR/L Acute Rehabilitation Services Pager: [phone] Office: [phone]  Yarel Rushlow C 03/07/2020, 1:11 PM

## 2020-03-07 NOTE — Progress Notes (Signed)
PT Cancellation Note  Patient Details Name: [name] MRN: [id number] DOB: 1946-05-22   Cancelled Treatment:    Reason Eval/Treat Not Completed: Patient at procedure or test/unavailable.  PT arrived to explain the PT evaluation and process but was stopped by arrival of tech to take her to HD.  Will retry today as time and pt allow.   Ramond Dial 03/07/2020, 12:17 PM   Mee Hives, PT MS Acute Rehab Dept. Number: [place]

## 2020-03-07 NOTE — Procedures (Signed)
I was present at this dialysis session. I have reviewed the session itself and made appropriate changes.   Vital signs in last 24 hours:  Temp:  [98 F (36.7 C)-99.2 F (37.3 C)] 98 F (36.7 C) (08/03 1114) Pulse Rate:  [69-115] 82 (08/03 1114) Resp:  [16-22] 19 (08/03 1114) BP: (112-136)/(39-59) 135/49 (08/03 1114) SpO2:  [93 %-100 %] 98 % (08/03 1114) Weight:  [83 kg] 83 kg (08/03 0444) Weight change: 10.4 kg Filed Weights   03/06/20 0129 03/07/20 0444  Weight: 72.6 kg 83 kg    Recent Labs  Lab 03/06/20 0634 03/06/20 0634 03/07/20 0457  NA 141   < > 134*  K >7.5*   < > 5.8*  CL 102   < > 99  CO2 25   < > 24  GLUCOSE 58*   < > 133*  BUN 70*   < > 24*  CREATININE 9.93*   < > 5.28*  CALCIUM 7.3*   < > 8.2*  PHOS 10.0*  --   --    < > = values in this interval not displayed.    Recent Labs  Lab 03/06/20 0143 03/06/20 0242 03/07/20 0457  WBC 7.4  --  4.0  NEUTROABS  --   --  3.7  HGB 11.8* 12.6 10.5*  HCT 38.0 37.0 32.8*  MCV 97.4  --  94.5  PLT 185  --  162    Scheduled Meds: . allopurinol  100 mg Oral BID  . amiodarone  200 mg Oral Daily  . apixaban  2.5 mg Oral BID  . aspirin EC  81 mg Oral Daily  . atorvastatin  40 mg Oral QHS  . calcium acetate  1,334 mg Oral TID WC  . Chlorhexidine Gluconate Cloth  6 each Topical Q0600  . Chlorhexidine Gluconate Cloth  6 each Topical Q0600  . gabapentin  100 mg Oral QHS  . insulin aspart  0-15 Units Subcutaneous TID AC & HS  . insulin glargine  10 Units Subcutaneous Daily  . ipratropium-albuterol  3 mL Nebulization TID  . methylPREDNISolone (SOLU-MEDROL) injection  60 mg Intravenous BID  . pantoprazole  40 mg Oral Daily  . tamsulosin  0.4 mg Oral Daily   Continuous Infusions: . sodium chloride    . sodium chloride    . doxycycline (VIBRAMYCIN) IV 100 mg (03/07/20 1045)  . [START ON 03/08/2020] ferric gluconate (FERRLECIT/NULECIT) IV     PRN Meds:.sodium chloride, sodium chloride, acetaminophen **OR**  acetaminophen, alteplase, famotidine, heparin, heparin, ipratropium-albuterol, nitroGLYCERIN, ondansetron **OR** ondansetron (ZOFRAN) IV, polyethylene glycol, polyvinyl alcohol   Donetta Potts,  MD 03/07/2020, 12:40 PM

## 2020-03-07 NOTE — Progress Notes (Signed)
PROGRESS NOTE    [name]  WCH:[id number] DOB: 03-27-1946 DOA: 03/06/2020 PCP: [name], MD   Brief Narrative:  74 year old female with past medical history of end-stage renal disease (MWF schedule), coronary artery disease, obstructive sleep apnea (on BIPAP QHS), hyperlipidemia, gastroesophageal reflux disease, diabetes mellitus type 2, obesity, COPD, atrial fibrillation who presents to [facility] emergency department with complaints of shortness of breath. Patient explains that approximately 1 week ago she began to develop shortness of breath.  Initially the shortness of breath was mild in intensity but progressively worsened as the days followed.  The shortness of breath was worse with exertion and improved with rest.  Shortness of breath is associated with cough, intermittently productive with clear to white sputum.  Patient also complains of intermittent chest discomfort that she describes as midsternal in location moderate intensity and occurring with deep inspiration or cough. Patient states that as her symptoms worsened she developed associated generalized weakness and poor appetite.  Because of the patient's worsening symptoms she missed her Friday hemodialysis session. Upon further questioning patient denies recent travel, sick contacts or confirmed contacts with COVID-19 infection. Patient shortness of breath and cough continue to worsen and so she eventually presented to [facility] emergency department for evaluation.  Upon evaluation in the emergency department patient was found to have a markedly elevated potassium of 8.0 with peaked T waves on EKG.  Case was discussed with Dr. Posey Pronto with nephrology was arranged for emergent dialysis.  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-stage renal disease) (HCC)   Chronic respiratory failure with hypoxia (HCC)   Mixed diabetic hyperlipidemia associated with type 2 diabetes mellitus (HCC)   Chronic gout 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   Acute hyperkalemia   GERD with esophagitis   Acute bacterial bronchitis  Acute symptomatic hyperkalemia with EKG changes, POA - Patient presenting with severe hyperkalemia with EKG changes in the setting of missed dialysis - Patient already underwent dialysis per nephrology, appreciate insight and recommendations - K 5.8 today - scheduled for repeat hemodialysis this afternoon per nephro schedule - Continue telemetry, daily labs to ensure resolution of severe hyperkalemia  Acute hypoxic respiratory distress on chronic hypoxic respiratory failure, POA  Rule out acute COPD exacerbation Volume overload in the setting of above, rule out heart failure exacerbation  - Respiratory distress improving drastically after dialysis likely volume overload is primary driving force behind her symptoms - Questionable concurrent COPD exacerbation although patient denies wheezing but given shortness of breath with dyspnea was initiated on doxycycline to cover acute bronchitis -continue short course, 5-day maximum - If respiratory status does not improve with volume control would consider steroids -Continue respiratory support, nebs  -currently holding steroids given possible infectious process  -Chronic respiratory failure 2 L nasal cannula around-the-clock, patient indicates she has been increasing her oxygen at night and with exertion but does not recall the precise number - currently requiring elevated oxygen as below. -Ambulatory oxygen when possible to evaluate for exertional hypoxia SpO2: 93 % O2 Flow Rate (L/min): 4 L/min  Diabetes mellitus type 2 with end-stage renal disease on long-term insulin therapy, moderately  well controlled Continue ACHS Accu-Cheks Lab Results  Component  Value Date   HGBA1C 5.6 03/07/2020  Continue sliding scale insulin, hypoglycemic protocol, Lantus 10 units daily Continue diabetic diet  Benign essential HTN - Continue home regimen of antihypertensive therapy  Gout -Not currently in acute flare  - Cntinue allopurinol  Mixed diabetic hyperlipidemia associated with type 2 diabetes mellitus ([place]) Continue home regimen of statin therapy  GERD with esophagitis Continue proton pump inhibitor therapy   DVT prophylaxis: Eliquis Code Status: Full Family Communication: Son at bedside  Status is: Inpatient  Dispo: The patient is from: Home              Anticipated d/c is to: To be determined              Anticipated d/c date is: 38 to 72 hours              Patient currently not medically stable for discharge due to ongoing need for recurrent evaluation with nephrology for urgent/emergent dialysis, ongoing volume overload, acute hypoxic respiratory distress, possible COPD exacerbation with ongoing need for IV antibiotics and close monitoring in the setting of severe electrolyte derangements as above.  Consultants:   Nephrology  Procedures:   Dialysis  Antimicrobials:  Doxycycline 03/05/2020 --> discontinue 03/09/2020  Subjective: No acute issues or events overnight, patient feels somewhat improved but not yet back to baseline continues to feel somewhat fatigued and weak with shortness of breath even at rest.  Denies nausea vomiting diarrhea constipation headache fevers cough sputum production or chest pain.  Objective: Vitals:   03/06/20 2011 03/06/20 2230 03/06/20 2343 03/07/20 0444  BP:   (!) 115/39 (!) 112/49  Pulse:  81 75 69  Resp:  16 16 20   Temp:   99.2 F (37.3 C) 98 F (36.7 C)  TempSrc:   Oral Axillary  SpO2: 100% 99% 99% 93%  Weight:    83 kg  Height:        Intake/Output Summary (Last 24 hours) at 03/07/2020 0820 Last data filed at 03/07/2020  0400 Gross per 24 hour  Intake 491 ml  Output 2000 ml  Net -1509 ml   Filed Weights   03/06/20 0129 03/07/20 0444  Weight: 72.6 kg 83 kg    Examination:  General:  Pleasantly resting in bed, No acute distress. HEENT:  Normocephalic atraumatic.  Sclerae nonicteric, noninjected.  Extraocular movements intact bilaterally. Neck:  Without mass or deformity.  Trachea is midline.  Left IJ hemodialysis catheter, bandage clean dry intact Lungs:  Clear to auscultate bilaterally without rhonchi, wheeze, or rales. Heart:  Regular rate and rhythm.  Without murmurs, rubs, or gallops. Abdomen:  Soft, nontender, nondistended.  Without guarding or rebound. Extremities: Without cyanosis, clubbing, edema, or obvious deformity. Vascular:  Dorsalis pedis and posterior tibial pulses palpable bilaterally. Skin:  Warm and dry, no erythema, no ulcerations.    Data Reviewed: I have personally reviewed following labs and imaging studies  CBC: Recent Labs  Lab 03/06/20 0143 03/06/20 0242 03/07/20 0457  WBC 7.4  --  4.0  NEUTROABS  --   --  3.7  HGB 11.8* 12.6 10.5*  HCT 38.0 37.0 32.8*  MCV 97.4  --  94.5  PLT 185  --  122   Basic Metabolic Panel: Recent Labs  Lab 03/06/20 0143 03/06/20 0242 03/06/20 0634 03/07/20 0457  NA 140 138 141 134*  K >7.5* 8.0* >7.5* 5.8*  CL 101 104 102 99  CO2 24  --  25 24  GLUCOSE 174* 157* 58* 133*  BUN 70* 66* 70* 24*  CREATININE 10.11* 10.80* 9.93* 5.28*  CALCIUM 7.1*  --  7.3* 8.2*  MG  --   --   --  2.1  PHOS  --   --  10.0*  --    GFR: Estimated Creatinine Clearance: 8 mL/min (A) (by C-G formula based on SCr of 5.28 mg/dL (H)). Liver Function Tests: Recent Labs  Lab 03/06/20 0634 03/07/20 0457  AST  --  14*  ALT  --  13  ALKPHOS  --  63  BILITOT  --  1.3*  PROT  --  6.1*  ALBUMIN 3.2* 3.1*   No results for input(s): LIPASE, AMYLASE in the last 168 hours. No results for input(s): AMMONIA in the last 168 hours. Coagulation Profile: No  results for input(s): INR, PROTIME in the last 168 hours. Cardiac Enzymes: No results for input(s): CKTOTAL, CKMB, CKMBINDEX, TROPONINI in the last 168 hours. BNP (last 3 results) No results for input(s): PROBNP in the last 8760 hours. HbA1C: Recent Labs    03/07/20 0457  HGBA1C 5.6   CBG: Recent Labs  Lab 03/06/20 1058 03/06/20 1552 03/06/20 2114  GLUCAP 77 132* 140*   Lipid Profile: No results for input(s): CHOL, HDL, LDLCALC, TRIG, CHOLHDL, LDLDIRECT in the last 72 hours. Thyroid Function Tests: No results for input(s): TSH, T4TOTAL, FREET4, T3FREE, THYROIDAB in the last 72 hours. Anemia Panel: No results for input(s): VITAMINB12, FOLATE, FERRITIN, TIBC, IRON, RETICCTPCT in the last 72 hours. Sepsis Labs: No results for input(s): PROCALCITON, LATICACIDVEN in the last 168 hours.  Recent Results (from the past 240 hour(s))  SARS Coronavirus 2 by RT PCR (hospital order, performed in [facility] hospital lab) Nasopharyngeal Nasopharyngeal Swab     Status: None   Collection Time: 03/06/20  2:33 AM   Specimen: Nasopharyngeal Swab  Result Value Ref Range Status   SARS Coronavirus 2 NEGATIVE NEGATIVE Final    Comment: (NOTE) SARS-CoV-2 target nucleic acids are NOT DETECTED.  The SARS-CoV-2 RNA is generally detectable in upper and lower respiratory specimens during the acute phase of infection. The lowest concentration of SARS-CoV-2 viral copies this assay can detect is 250 copies / mL. A negative result does not preclude SARS-CoV-2 infection and should not be used as the sole basis for treatment or other patient management decisions.  A negative result may occur with improper specimen collection / handling, submission of specimen other than nasopharyngeal swab, presence of viral mutation(s) within the areas targeted by this assay, and inadequate number of viral copies (<250 copies / mL). A negative result must be combined with clinical observations, patient history, and  epidemiological information.  Fact Sheet for Patients:   StrictlyIdeas.no  Fact Sheet for Healthcare Providers: BankingDealers.co.za  This test is not yet approved or  cleared by the [place] FDA and has been authorized for detection and/or diagnosis of SARS-CoV-2 by FDA under an Emergency Use Authorization (EUA).  This EUA will remain in effect (meaning this test can be used) for the duration of the COVID-19 declaration under Section 564(b)(1) of the Act, 21 U.S.C. section 360bbb-3(b)(1), unless the authorization is terminated or revoked sooner.  Performed at [facility], [place] [address] [place], [place] [address]   MRSA PCR Screening     Status: None   Collection Time: 03/06/20 10:24 PM   Specimen: Nasopharyngeal  Result Value Ref Range Status   MRSA by PCR NEGATIVE NEGATIVE Final    Comment:  The GeneXpert MRSA Assay (FDA approved for NASAL specimens only), is one component of a comprehensive MRSA colonization surveillance program. It is not intended to diagnose MRSA infection nor to guide or monitor treatment for MRSA infections. Performed at [facility], [place] [address] [place], [place] [address]     Radiology Studies: DG Chest 2 View  Result Date: 03/06/2020 CLINICAL DATA:  Shortness of breath EXAM: CHEST - 2 VIEW COMPARISON:  November 21, 2019 FINDINGS: The heart size and mediastinal contours are unchanged with mild cardiomegaly. Again noted is a right-sided MediPort catheter with the tip at the superior cavoatrial junction and a left-sided central venous catheter. No large airspace consolidation or pleural effusion. Subsegmental atelectasis seen at both lung bases. A hiatal hernia is present. No acute osseous abnormality. IMPRESSION: Subsegmental atelectasis. Stable large hiatal hernia. Electronically Signed   By: Prudencio Pair M.D.   On: 03/06/2020 02:16   Scheduled Meds: . allopurinol  100 mg Oral  BID  . amiodarone  200 mg Oral Daily  . apixaban  2.5 mg Oral BID  . aspirin EC  81 mg Oral Daily  . atorvastatin  40 mg Oral QHS  . Chlorhexidine Gluconate Cloth  6 each Topical Q0600  . gabapentin  100 mg Oral QHS  . insulin aspart  0-15 Units Subcutaneous TID AC & HS  . insulin glargine  10 Units Subcutaneous Daily  . ipratropium-albuterol  3 mL Nebulization TID  . methylPREDNISolone (SOLU-MEDROL) injection  60 mg Intravenous BID  . pantoprazole  40 mg Oral Daily  . tamsulosin  0.4 mg Oral Daily   Continuous Infusions: . sodium chloride    . sodium chloride    . doxycycline (VIBRAMYCIN) IV Stopped (03/07/20 0205)     LOS: 1 day   Time spent: 43min  Isa Kohlenberg C Felipe Paluch, DO [facility]  If 7PM-7AM, please contact night-coverage [email or web address]  03/07/2020, 8:20 AM

## 2020-03-07 NOTE — Progress Notes (Signed)
Initial Nutrition Assessment  DOCUMENTATION CODES:   Obesity unspecified  INTERVENTION:    Nepro Shake po BID, each supplement provides 425 kcal and 19 grams protein.  Renal MVI daily.  NUTRITION DIAGNOSIS:   Increased nutrient needs related to chronic illness (ESRD on HD) as evidenced by estimated needs.  GOAL:   Patient will meet greater than or equal to 90% of their needs  MONITOR:   PO intake, Supplement acceptance, Labs, I & O's  REASON FOR ASSESSMENT:   Consult COPD Protocol  ASSESSMENT:   74 yo female admitted with SOB, COPD exacerbation, acute on chronic CHF after missing HD session on Friday. PMH includes ESRD on HD M-W-F, CAD, OSA on BiPAP at night, HLD, GERD, DM-2, obesity, COPD, A fib.   Spoke with patient at bedside while she was receiving HD on HD unit.   Usually eats 2 meals per day, especially on HD days. Intake has been decreased recently due to SOB. She has a hard time following a diet at home because she has too many diets to follow (renal, cardiac, low cholesterol, gout, diabetes). Patient is not appropriate for education at this time, but would benefit from diet education in the near future. She agreed to try a PO supplement to increase protein and kcal intake.  Labs reviewed. Sodium 134, potassium 5.8, BUN 24, creatinine 5.28 CBG: 127-131  Medications reviewed and include phoslo, novolog, lantus, solu-medrol, flomax, ferric gluconate.  Recent weights reviewed. Suspect fluctuations in weight related to volume status.  NUTRITION - FOCUSED PHYSICAL EXAM:    Most Recent Value  Orbital Region No depletion  Upper Arm Region No depletion  Thoracic and Lumbar Region No depletion  Buccal Region No depletion  Temple Region No depletion  Clavicle Bone Region No depletion  Clavicle and Acromion Bone Region No depletion  Scapular Bone Region Unable to assess  Dorsal Hand No depletion  Patellar Region No depletion  Anterior Thigh Region No depletion   Posterior Calf Region No depletion  Edema (RD Assessment) Mild  Hair Reviewed  Eyes Reviewed  Mouth Reviewed  Skin Reviewed  Nails Reviewed       Diet Order:   Diet Order            Diet renal/carb modified with fluid restriction Diet-HS Snack? Nothing; Fluid restriction: 1200 mL Fluid; Room service appropriate? Yes; Fluid consistency: Thin  Diet effective now                 EDUCATION NEEDS:   Not appropriate for education at this time  Skin:  Skin Assessment: Reviewed RN Assessment (MASD to sacrum, abdominal folds)  Last BM:  8/1  Height:   Ht Readings from Last 1 Encounters:  03/06/20 4\' 8"  (1.422 m)    Weight:   Wt Readings from Last 1 Encounters:  03/07/20 77.4 kg    Ideal Body Weight:  42.4 kg  BMI:  Body mass index is 38.26 kg/m.  Estimated Nutritional Needs:   Kcal:  1600-1800  Protein:  75-85 gm  Fluid:  1 L + UOP    Lucas Mallow, RD, LDN, CNSC Please refer to Amion for contact information.

## 2020-03-07 NOTE — Consult Note (Addendum)
[facility] Renal Consultation Note    Indication for Consultation:  Management of ESRD/hemodialysis; anemia, hypertension/volume and secondary hyperparathyroidism  [id number], Nicole Kindred, MD  HPI: [name] is a 74 y.o. female. ESRD on HD MWF at [facility].  Past medical history significant for coronary artery disease, obstructive sleep apnea (on BIPAP QHS), hyperlipidemia, GERD, diabetes mellitus type 2, obesity, COPD, atrial fibrillation. Of note, she is compliant with her dialysis treatment, however, she missed her first treatment on 03/03/20 since April.   Patient presented to the ED yesterday with complaints of shortness of breath. She reports that a week ago she developed a persistent cough and shortness of breath. She states that the shortness of breath was worse with exertion and got better with rest. Patient said she then experienced nausea and vomiting on Saturday and Sunday with accompanying dizziness, chest fullness, diarrhea and weakness in legs. She also stated that she experienced "heart fluttering." This is when her son urged her to seek care at ED. ED course showed markedly elevated potassium of 8.0 with peaked T waves on EKG. CXR revealed subsegmental atelectasis. Patient received emergent dialysis yesterday and was admitted for further monitoring.  Today, patient was examined while lying in bed. She says her cough has not gotten better since dialysis yesterday but that she doesn't feel as short of breath now as she did yesterday. Of note, patient states that she uses 2L O2 [place] at home and is currently on 4L O2 [place] in hospital room with SPO2 @ 100%. She says she is still experiencing some nausea but has not vomited since admission. She denies any current chest pain, palpitations, diarrhea and abdominal pain.   Past Medical History:  Diagnosis Date  . Blood transfusion without reported diagnosis   . CAD (coronary artery disease)    Nonobstructive at cardiac  catheterization 2000  . Cataract   . Cervical cancer ([place]) 1978  . CHF (congestive heart failure) ([place])   . Chronic back pain   . Chronic kidney disease    MWF dialysis  . COPD (chronic obstructive pulmonary disease) ([place])   . Degenerative disc disease   . Dysrhythmia    a-fib  . Essential hypertension, benign   . GERD (gastroesophageal reflux disease)   . Gout   . Hiatal hernia 07/27/2013  . History of diverticulitis of colon   . History of hiatal hernia   . Iron deficiency anemia   . Irritable bowel syndrome   . Lumbar radiculopathy   . Mixed hyperlipidemia   . Moderate major depression, single episode ([place]) 07/21/2019  . Neuropathy   . Osteoporosis   . Ovarian cancer [facility]) 1978   patient denies. States this was cervical cancer  . Oxygen deficiency    room air  . Sleep apnea   . Type 2 diabetes mellitus ([place])   . Vitamin B deficiency 12/25/2009  . Vitamin B12 deficiency    Past Surgical History:  Procedure Laterality Date  . ABDOMINAL HYSTERECTOMY    . AV FISTULA PLACEMENT Left 08/05/2019   Procedure: ARTERIOVENOUS (AV) FISTULA CREATION LEFT ARM;  Surgeon: Waynetta Sandy, MD;  Location: [place];  Service: Vascular;  Laterality: Left;  . BACK SURGERY    . BASCILIC VEIN TRANSPOSITION Left 10/05/2019   Procedure: SECOND STAGE LEFT BASCILIC VEIN TRANSPOSITION;  Surgeon: Waynetta Sandy, MD;  Location: [place];  Service: Vascular;  Laterality: Left;  . Benign breast cysts    . CHOLECYSTECTOMY    . COLONOSCOPY  10/01/2006   SLF:Pan colonic diverticulosis and moderate internal hemorrhoids/ Otherwise no polyps, masses, inflammatory changes or AVMs/  . COLONOSCOPY  2011   SLF: pancolonic diverticulosis, large internal hemorrhoids  . COLONOSCOPY N/A 01/26/2016   Procedure: COLONOSCOPY;  Surgeon: [name], MD;  [facility];  Service: Endoscopy;  Laterality: N/A;  830   . COLONOSCOPY WITH PROPOFOL N/A 02/10/2020   Procedure: COLONOSCOPY WITH  PROPOFOL;  Surgeon: Daneil Dolin, MD;  [facility];  Service: Endoscopy;  Laterality: N/A;  10:45am  . ESOPHAGOGASTRODUODENOSCOPY  11/19/2006   SLF: Large hiatal hernia without evidence of [place] ulcers/. Distal esophageal stricture, which allowed the gastroscope to pass without resistance.  A 16 mm Sav[place] later passed with mild resistance/ Normal stomach.sb bx negative  . ESOPHAGOGASTRODUODENOSCOPY  10/01/2006   RCV:ELFYB hiatal hernia.  Distal esophagus without evidence of   erythema, ulceration or Barrett's esophagus  . ESOPHAGOGASTRODUODENOSCOPY  2011   SLF: large hh, distal esophageal web narrowing to 13mm s/p dilation to 28mm  . ESOPHAGOGASTRODUODENOSCOPY N/A 08/06/2013   SLF: 1. Stricture at the gastroesophageal junction 2. large hiatal hernia. 3. Mild erosive gastritis.  Marland Kitchen ESOPHAGOGASTRODUODENOSCOPY (EGD) WITH PROPOFOL N/A 07/19/2019   rourk: Mild erosive reflux esophagitis.  Mild Schatzki ring status post dilation.  Large hiatal hernia with at least one half of the stomach above the diaphragm.  Gastric mucosa erythematous.  Marland Kitchen GIVENS CAPSULE STUDY N/A 08/06/2013   INCOMPLETE-SMALL BOWLE ULCERS  . IR FLUORO GUIDE CV LINE LEFT  07/29/2019  . IR US GUIDE VASC ACCESS LEFT  07/29/2019  . KNEE SURGERY Right   . PARTIAL HYSTERECTOMY  1978  . small bowel capsule  2008   negative  . SPINE SURGERY    . TONSILLECTOMY AND ADENOIDECTOMY    . Two back surgeries/fusion    . UMBILICAL HERNIA REPAIR  2010   Family History  Problem Relation Age of Onset  . Colon cancer Brother        diagnosed age 50. Living.   Marland Kitchen Ulcers Sister   . Diabetes Sister   . Heart attack Sister   . Kidney failure Sister   . Stroke Sister   . Ulcers Mother   . Diabetes Mother   . Heart attack Mother   . Stroke Mother   . Asthma Mother   . Heart disease Mother   . Cervical cancer Mother   . Heart attack Brother   . Heart disease Brother   . Asthma Sister   . Diabetes Brother   . Stroke Maternal  Grandmother   . Heart attack Maternal Grandmother   . Heart attack Other   . Early death Father        MVA in his 43s   Social History:  reports that she quit smoking about 58 years ago. Her smoking use included cigarettes. She started smoking about 59 years ago. She has a 1.00 pack-year smoking history. She has never used smokeless tobacco. She reports that she does not drink alcohol and does not use drugs. No Known Allergies Prior to Admission medications   Medication Sig Start Date End Date Taking? Authorizing Provider  acetaminophen (TYLENOL) 500 MG tablet Take 500-1,000 mg by mouth every 6 (six) hours as needed (for pain.).   Yes [provider]  albuterol (PROVENTIL) (2.5 MG/3ML) 0.083% nebulizer solution Inhale 3 mLs into the lungs daily as needed for wheezing or shortness of breath. 12/22/19  Yes [provider]  albuterol (VENTOLIN HFA) 108 (  90 Base) MCG/ACT inhaler Inhale 2 puffs into the lungs every 6 (six) hours as needed for wheezing or shortness of breath.  04/09/19  Yes [provider]  allopurinol (ZYLOPRIM) 100 MG tablet Take 1 tablet (100 mg total) by mouth 2 (two) times daily. 08/09/19  Yes Nita Sells, MD  amiodarone (PACERONE) 200 MG tablet Take 1 tablet (200 mg total) by mouth daily. 08/10/19  Yes Nita Sells, MD  apixaban (ELIQUIS) 2.5 MG TABS tablet Take 2.5 mg by mouth 2 (two) times daily.   Yes [provider]  aspirin EC 81 MG tablet Take 81 mg by mouth daily.   Yes [provider]  atorvastatin (LIPITOR) 40 MG tablet Take 1 tablet (40 mg total) by mouth at bedtime. 05/04/18  Yes Satira Sark, MD  Biotin 5000 MCG TABS Take 5,000 mcg by mouth daily.   Yes [provider]  Carboxymethylcellul-Glycerin (CLEAR EYES FOR DRY EYES) 1-0.25 % SOLN Place 1 drop into both eyes daily as needed (dry eys).   Yes [provider]  esomeprazole (NEXIUM) 40 MG capsule Take 40 mg by mouth daily. 12/06/19  Yes  [provider]  famotidine (PEPCID) 40 MG tablet TAKE 1 TABLET AT BEDTIME AS NEEDED TO CONTROL REFLUX Patient taking differently: Take 40 mg by mouth at bedtime as needed for heartburn.  06/23/15  Yes Mannam, Praveen, MD  gabapentin (NEURONTIN) 100 MG capsule Take 100 mg by mouth at bedtime. 12/29/19  Yes [provider]  insulin lispro (HUMALOG) 100 UNIT/ML injection Inject 5-16 Units into the skin 3 (three) times daily as needed (greater than 120).    Yes [provider]  LANTUS SOLOSTAR 100 UNIT/ML SOPN Inject 10 Units into the skin daily as needed (over 120 blood sugars).  10/06/12  Yes [provider]  lidocaine (LIDODERM) 5 % Place 1 patch onto the skin daily. Remove & Discard patch within 12 hours or as directed by MD Patient taking differently: Place 1 patch onto the skin daily as needed (pain.). Remove & Discard patch within 12 hours or as directed by MD 11/21/19  Yes Garald Balding, PA-C  nitroGLYCERIN (NITROSTAT) 0.4 MG SL tablet Place 0.4 mg under the tongue every 5 (five) minutes x 3 doses as needed for chest pain. 12/07/19  Yes [provider]  ondansetron (ZOFRAN) 4 MG tablet Take 4 mg by mouth daily as needed for nausea/vomiting. 12/29/19  Yes [provider]  tamsulosin (FLOMAX) 0.4 MG CAPS capsule Take 1 capsule (0.4 mg total) by mouth daily. 07/08/19  Yes Johnson, Clanford L, MD  TRELEGY ELLIPTA 100-62.5-25 MCG/INH AEPB Inhale 1 puff into the lungs daily as needed (respiratory issues.).    Yes [provider]   Current Facility-Administered Medications  Medication Dose Route Frequency Provider Last Rate Last Admin  . 0.9 %  sodium chloride infusion  100 mL Intravenous PRN Shalhoub, Sherryll Burger, MD      . 0.9 %  sodium chloride infusion  100 mL Intravenous PRN Shalhoub, Sherryll Burger, MD      . acetaminophen (TYLENOL) tablet 650 mg  650 mg Oral Q6H PRN Shalhoub, Sherryll Burger, MD       Or  . acetaminophen (TYLENOL) suppository 650 mg   650 mg Rectal Q6H PRN Shalhoub, Sherryll Burger, MD      . allopurinol (ZYLOPRIM) tablet 100 mg  100 mg Oral BID Vernelle Emerald, MD   100 mg at 03/06/20 2201  . alteplase (CATHFLO ACTIVASE) injection 2  mg  2 mg Intracatheter Once PRN Vernelle Emerald, MD      . amiodarone (PACERONE) tablet 200 mg  200 mg Oral Daily Shalhoub, Sherryll Burger, MD   200 mg at 03/06/20 1232  . apixaban (ELIQUIS) tablet 2.5 mg  2.5 mg Oral BID Vernelle Emerald, MD   2.5 mg at 03/06/20 2201  . aspirin EC tablet 81 mg  81 mg Oral Daily Vernelle Emerald, MD   81 mg at 03/06/20 1232  . atorvastatin (LIPITOR) tablet 40 mg  40 mg Oral QHS Shalhoub, Sherryll Burger, MD   40 mg at 03/06/20 2201  . Chlorhexidine Gluconate Cloth 2 % PADS 6 each  6 each Topical Q0600 Shalhoub, Sherryll Burger, MD   6 each at 03/07/20 0533  . doxycycline (VIBRAMYCIN) 100 mg in sodium chloride 0.9 % 250 mL IVPB  100 mg Intravenous Q12H Shalhoub, Sherryll Burger, MD   Stopped at 03/07/20 0205  . famotidine (PEPCID) tablet 40 mg  40 mg Oral QHS PRN Shalhoub, Sherryll Burger, MD      . gabapentin (NEURONTIN) capsule 100 mg  100 mg Oral QHS Shalhoub, Sherryll Burger, MD   100 mg at 03/06/20 2201  . heparin injection 1,000 Units  1,000 Units Dialysis PRN Vernelle Emerald, MD      . heparin injection 2,900 Units  40 Units/kg Dialysis PRN Vernelle Emerald, MD   2,900 Units at 03/06/20 0555  . insulin aspart (novoLOG) injection 0-15 Units  0-15 Units Subcutaneous TID AC & HS Shalhoub, Sherryll Burger, MD   2 Units at 03/06/20 2221  . insulin glargine (LANTUS) injection 10 Units  10 Units Subcutaneous Daily Little Ishikawa, MD   10 Units at 03/06/20 1626  . ipratropium-albuterol (DUONEB) 0.5-2.5 (3) MG/3ML nebulizer solution 3 mL  3 mL Nebulization Q4H PRN Shalhoub, Sherryll Burger, MD      . ipratropium-albuterol (DUONEB) 0.5-2.5 (3) MG/3ML nebulizer solution 3 mL  3 mL Nebulization TID Little Ishikawa, MD   3 mL at 03/07/20 0834  . methylPREDNISolone sodium succinate (SOLU-MEDROL) 125 mg/2 mL  injection 60 mg  60 mg Intravenous BID Vernelle Emerald, MD   60 mg at 03/07/20 0004  . nitroGLYCERIN (NITROSTAT) SL tablet 0.4 mg  0.4 mg Sublingual Q5 Min x 3 PRN Shalhoub, Sherryll Burger, MD      . ondansetron [facility]) tablet 4 mg  4 mg Oral Q6H PRN Shalhoub, Sherryll Burger, MD       Or  . ondansetron [facility]) injection 4 mg  4 mg Intravenous Q6H PRN Shalhoub, Sherryll Burger, MD      . pantoprazole (PROTONIX) EC tablet 40 mg  40 mg Oral Daily Shalhoub, Sherryll Burger, MD   40 mg at 03/06/20 1232  . polyethylene glycol (MIRALAX / GLYCOLAX) packet 17 g  17 g Oral Daily PRN Shalhoub, Sherryll Burger, MD      . polyvinyl alcohol (LIQUIFILM TEARS) 1.4 % ophthalmic solution 1 drop  1 drop Both Eyes Daily PRN Shalhoub, Sherryll Burger, MD      . tamsulosin (FLOMAX) capsule 0.4 mg  0.4 mg Oral Daily Shalhoub, Sherryll Burger, MD   0.4 mg at 03/06/20 1231   Labs: Basic Metabolic Panel: Recent Labs  Lab 03/06/20 0143 03/06/20 0143 03/06/20 0242 03/06/20 0634 03/07/20 0457  NA 140   < > 138 141 134*  K >7.5*   < > 8.0* >7.5* 5.8*  CL 101   < > 104 102 99  CO2 24  --   --  25 24  GLUCOSE 174*   < > 157* 58* 133*  BUN 70*   < > 66* 70* 24*  CREATININE 10.11*   < > 10.80* 9.93* 5.28*  CALCIUM 7.1*  --   --  7.3* 8.2*  PHOS  --   --   --  10.0*  --    < > = values in this interval not displayed.   Liver Function Tests: Recent Labs  Lab 03/06/20 0634 03/07/20 0457  AST  --  14*  ALT  --  13  ALKPHOS  --  63  BILITOT  --  1.3*  PROT  --  6.1*  ALBUMIN 3.2* 3.1*   CBC: Recent Labs  Lab 03/06/20 0143 03/06/20 0242 03/07/20 0457  WBC 7.4  --  4.0  NEUTROABS  --   --  3.7  HGB 11.8* 12.6 10.5*  HCT 38.0 37.0 32.8*  MCV 97.4  --  94.5  PLT 185  --  162   CBG: Recent Labs  Lab 03/06/20 1058 03/06/20 1552 03/06/20 2114  GLUCAP 77 132* 140*    Studies/Results: DG Chest 2 View  Result Date: 03/06/2020 CLINICAL DATA:  Shortness of breath EXAM: CHEST - 2 VIEW COMPARISON:  November 21, 2019 FINDINGS: The heart size and  mediastinal contours are unchanged with mild cardiomegaly. Again noted is a right-sided MediPort catheter with the tip at the superior cavoatrial junction and a left-sided central venous catheter. No large airspace consolidation or pleural effusion. Subsegmental atelectasis seen at both lung bases. A hiatal hernia is present. No acute osseous abnormality. IMPRESSION: Subsegmental atelectasis. Stable large hiatal hernia. Electronically Signed   By: Prudencio Pair M.D.   On: 03/06/2020 02:16    ROS: All others negative except those listed in HPI.  General: No weight loss, fever, chills  Cardiac:  Positive for chest pressure and palpitations, SOB, and DOE. Vascular: No history of rest pain in feet, claudication, nonhealing ulcers  or DVT  Pulmonary: Uses 2L [place] O2 at home. Positive for non-productive cough. No hemoptysis or wheezing.  Musculoskeletal: Positive for muscle weakness, especially in legs. Hematologic:Positive for history of anemia. Gastrointestinal: Positive for diarrhea. No hematochezia or melena, no abdominal pain.  Urinary: Anuric or makes small amount of urine. Denies changes in urination.  Physical Exam: Vitals:   03/06/20 2230 03/06/20 2343 03/07/20 0444 03/07/20 0836  BP:  (!) 115/39 (!) 112/49   Pulse: 81 75 69   Resp: 16 16 20    Temp:  99.2 F (37.3 C) 98 F (36.7 C)   TempSrc:  Oral Axillary   SpO2: 99% 99% 93% 98%  Weight:   83 kg   Height:         General: chronically ill, obese, female. NAD. Head: NCAT sclera not icteric MMM Lungs: Diminished lung sounds bilaterally with some wheezing heard in the bases. No rhonchi, crackles. No increased work of breathing. On 4L O2 [place].  Heart: RRR. No murmur, rubs or gallops.  Abdomen: soft, nontender, +BS, no guarding, no rebound tenderness Lower extremities: Trace bilateral lower leg edema.  Neuro: AAOx3. Moves all extremities spontaneously. Psych:  Responds to questions appropriately with a normal affect. Dialysis Access: R  TDC, Left upper AVF  Dialysis Orders:  MWF - [place]  4hrs, BFR 400, DFR 800,  EDW 78.5, 2K/ 2.5Ca  Access: Left upper AVF (can be used for dialysis) and TDC Heparin: No  Venofer 100 qHD x 10 - 1 completed. Calcitriol 0.39mcg PO qHD  Assessment/Plan:  1.  Acute hyperkalemia - Patient presenting with severe hyperkalemia with EKG changes in the setting of missed dialysis. Medically treated in the ED. Underwent emergent hemodialysis yesterday which improved K from 8.0 to 5.8. Will repeat dialysis shortened 3hr treatment today with 2K bath. Will continue to monitor potassium levels. Monitoring patient on telemetry which is being followed by primary.  2.  ESRD -  HD MWF. Will receive 3HR treatment again today d/t elevated potassium. Will received 3HR treatment tomorrow to resume normal schedule. Continue to monitor labs. 3.  Hypertension/volume  - Patient has been normotensive in the hospital. Not currently on antihypertensive medications. Hypervolemic status based on lung sounds and edema. 2L taken off in HD yesterday which was well tolerated. Today, net UF goal to standing weight of 78.5kg. Call PA if goal>4L or <1L. Continue to get standing weights with HD. Continue to monitor blood pressures.  4.  Anemia of CKD - Hgb 10.5. Currently not receiving ESA.  On IV iron as OP, will order to continue while admitted. Will continue to follow Hgb trend.  5.  Secondary Hyperparathyroidism -  Corrected Ca is within goal. Phosphorus of 10.0. Will order calcium-acetate as Phos binder. Continue Calcitriol qHD. Continue to monitor labs.  6.  Nutrition - Albumin 3.1. Continue renal/carb modified diet with fluid restriction. Start protein supplement.  7. COPD with acute exacerbation - Patient presenting with respiratory distress. Likely suffering from COPD exacerbation and acute bacterial bronchitis, per primary. Treating with bronchodilator therapy, IV systemic steroids, and antibiotics which is being managed by  primary.  Gustavus Messing, NP Student [employer]  Progress note written with assistance by NP student.  Provider performed own history and physical exam.  Plan discussed with student and documented.   Jen Mow, PA-C [facility] 03/07/2020, 10:11 AM   I have seen and examined this patient and agree with plan and assessment in the above note with renal recommendations/intervention highlighted.  Given persistent hyperkalemia and volume overload will plan for another short HD session and challenge UF as tolerated.  Broadus [name] A Melah Ebling,MD 03/07/2020 12:07 PM

## 2020-03-08 LAB — COMPREHENSIVE METABOLIC PANEL
ALT: 15 U/L (ref 0–44)
AST: 16 U/L (ref 15–41)
Albumin: 3.4 g/dL — ABNORMAL LOW (ref 3.5–5.0)
Alkaline Phosphatase: 62 U/L (ref 38–126)
Anion gap: 13 (ref 5–15)
BUN: 20 mg/dL (ref 8–23)
CO2: 27 mmol/L (ref 22–32)
Calcium: 8.7 mg/dL — ABNORMAL LOW (ref 8.9–10.3)
Chloride: 98 mmol/L (ref 98–111)
Creatinine, Ser: 3.82 mg/dL — ABNORMAL HIGH (ref 0.44–1.00)
GFR calc Af Amer: 13 mL/min — ABNORMAL LOW (ref >60)
GFR calc non Af Amer: 11 mL/min — ABNORMAL LOW (ref >60)
Glucose, Bld: 153 mg/dL — ABNORMAL HIGH (ref 70–99)
Potassium: 4.4 mmol/L (ref 3.5–5.1)
Sodium: 138 mmol/L (ref 135–145)
Total Bilirubin: 0.8 mg/dL (ref 0.3–1.2)
Total Protein: 6.2 g/dL — ABNORMAL LOW (ref 6.5–8.1)

## 2020-03-08 LAB — CBC
HCT: 35.7 % — ABNORMAL LOW (ref 36.0–46.0)
Hemoglobin: 11.6 g/dL — ABNORMAL LOW (ref 12.0–15.0)
MCH: 30.4 pg (ref 26.0–34.0)
MCHC: 32.5 g/dL (ref 30.0–36.0)
MCV: 93.7 fL (ref 80.0–100.0)
Platelets: 179 10*3/uL (ref 150–400)
RBC: 3.81 MIL/uL — ABNORMAL LOW (ref 3.87–5.11)
RDW: 14.6 % (ref 11.5–15.5)
WBC: 5.4 10*3/uL (ref 4.0–10.5)
nRBC: 0 % (ref 0.0–0.2)

## 2020-03-08 LAB — GLUCOSE, CAPILLARY
Glucose-Capillary: 155 mg/dL — ABNORMAL HIGH (ref 70–99)
Glucose-Capillary: 189 mg/dL — ABNORMAL HIGH (ref 70–99)
Glucose-Capillary: 139 mg/dL — ABNORMAL HIGH (ref 70–99)

## 2020-03-08 MED ORDER — CHLORHEXIDINE GLUCONATE CLOTH 2 % EX PADS
6.0000 | MEDICATED_PAD | Freq: Every day | CUTANEOUS | Status: DC
  Administered 2020-03-08: 6 via TOPICAL

## 2020-03-08 MED ORDER — HEPARIN SODIUM (PORCINE) 1000 UNIT/ML IJ SOLN
INTRAMUSCULAR | Status: AC
  Administered 2020-03-08: 1000 [IU]
  Filled 2020-03-08: qty 7

## 2020-03-08 MED ORDER — PREDNISONE 50 MG PO TABS
50.0000 mg | ORAL_TABLET | Freq: Every day | ORAL | 0 refills | Status: AC
Start: 2020-03-08 — End: 2020-03-11

## 2020-03-08 MED ORDER — DOXYCYCLINE MONOHYDRATE 100 MG PO TABS: 100.0000 mg | ORAL_TABLET | Freq: Two times a day (BID) | ORAL | 0 refills | Status: AC

## 2020-03-08 NOTE — Discharge Instructions (Signed)
Hyperkalemia Hyperkalemia occurs when the level of potassium in your blood is too high. Potassium is an important nutrient that helps the muscles and nerves function normally. It affects how the heart works, and it helps keep fluids and minerals balanced in the body. If there is too much potassium in your blood, it can affect your heart's ability to function normally. Potassium is normally removed (excreted) from the body by the kidneys. Hyperkalemia can result from various conditions. It can range from mild to severe. What are the causes? This condition may be caused by:  Taking in too much potassium. You can do this by: ? Using salt substitutes. They contain large amounts of potassium. ? Taking potassium supplements. ? Eating foods that are high in potassium.  Excreting too little potassium. This can happen if: ? Your kidneys are not working properly. Kidney (renal) disease, including short-term or long-term renal failure, is a common cause of hyperkalemia. ? You are taking medicines that lower your excretion of potassium. ? You have Addison's disease. ? You have a urinary tract blockage, such as kidney stones. ? You are on treatment to mechanically clean your blood (dialysis) and you skip a treatment.  Releasing a high amount of potassium from your cells into your blood. This can happen with: ? Injury to muscles (rhabdomyolysis) or other tissues. Most potassium is stored in your muscles. ? Severe burns or infections. ? Acidic blood plasma (acidosis). Acidosis can result from many diseases, such as uncontrolled diabetes. What increases the risk? The following factors may make you more likely to develop this condition:  Kidney disease. This puts you at the highest risk.  Addison's disease. This is a condition where the adrenal glands do not produce enough hormones.  Alcoholism or heavy drug use.  Using certain blood pressure medicines, such as ACE inhibitors, angiotensin II receptor  blockers (ARBs), or potassium-sparing diuretics such as spironolactone.  Severe injury or burn. What are the signs or symptoms? In many cases, there are no symptoms. However, when your potassium level becomes high enough, you may have symptoms such as:  An irregular or very slow heartbeat.  Nausea.  Tiredness (fatigue).  Confusion.  Tingling of your skin or numbness of your hands or feet.  Muscle cramps.  Muscle weakness.  Not being able to move (paralysis). How is this diagnosed? This condition may be diagnosed based on:  Your symptoms and medical history. Your health care provider will ask about your use of prescription and non-prescription drugs.  A physical exam.  Blood tests.  An electrocardiogram (ECG). How is this treated? Treatment depends on the cause and severity of your condition. Treatment may need to be done in the hospital setting. Treatment may include:  IV glucose (sugar) along with insulin to shift potassium out of your blood and into your cells.  A medicine called albuterol to shift potassium out of your blood and into your cells.  Medicines to remove the potassium from your body.  Dialysis to remove the potassium from your body.  Calcium to protect your heart from the effects of high potassium, such as irregular rhythms (arrhythmias). Follow these instructions at home:   Take over-the-counter and prescription medicines only as told by your health care provider.  Do not take any supplements, natural products, herbs, or vitamins without reviewing them with your health care provider. Certain supplements and natural food products contain high amounts of potassium.  Limit your alcohol intake as told by your health care provider.  Do not use  drugs. If you need help quitting, ask your health care provider.  If you have kidney disease, you may need to follow a low-potassium diet. A dietitian can help you learn which foods have high or low amounts of  potassium.  Keep all follow-up visits as told by your health care provider. This is important. Contact a health care provider if you:  Have an irregular or very slow heartbeat.  Feel light-headed.  Feel weak.  Are nauseous.  Have tingling or numbness in your hands or feet. Get help right away if you:  Have shortness of breath.  Have chest pain or discomfort.  Pass out.  Have muscle paralysis. Summary  Hyperkalemia occurs when the level of potassium in your blood is too high.  This condition may be caused by taking in too much potassium, excreting too little potassium, or releasing a high amount of potassium from your cells into your blood.  Hyperkalemia can result from many underlying conditions, especially chronic kidney disease, or from taking certain medicines.  Treatment of hyperkalemia may include medicine to shift potassium out of your blood and into your cells or to remove the potassium from your body.  If you have kidney disease, you may need to follow a low-potassium diet. A dietitian can help you learn which foods have high or low amounts of potassium. This information is not intended to replace advice given to you by your health care provider. Make sure you discuss any questions you have with your health care provider. Document Revised: 07/07/2017 Document Reviewed: 07/07/2017 Elsevier Patient Education  [place].

## 2020-03-08 NOTE — Discharge Summary (Signed)
Physician Discharge Summary  [name] QIH:[id number] DOB: 1946-01-24 DOA: 03/06/2020  PCP: [name], MD  Admit date: 03/06/2020 Discharge date: 03/08/2020  Admitted From: Home Disposition: Home  Recommendations for Outpatient Follow-up:  1. Follow up with PCP in 1-2 weeks 2. Follow-up with cardiology 3. Please obtain BMP/CBC in one week 4. Please follow up with your PCP on the following pending results: Unresulted Labs (From admission, onward) Comment          Start     Ordered   03/08/20 0500  CBC  Daily,   R      03/06/20 1807   03/07/20 0500  Comprehensive metabolic panel  Daily,   R      03/06/20 1807           Home Health: Yes Equipment/Devices: None  Discharge Condition: Stable CODE STATUS: Full code Diet recommendation: Renal/cardiac  Subjective: Seen and examined. She complained of some cough with clear sputum but no shortness of breath or any other complaint. Agreeable with plan of discharge.  Brief/Interim Summary: 74 year old female with past medical history of end-stage renal disease(MWF schedule), coronary artery disease, obstructive sleep apnea(on BIPAP QHS),hyperlipidemia, gastroesophageal reflux disease, diabetes mellitus type 2, obesity, COPD, atrial fibrillation who presented to [facility] emergency department with complaints of shortness of breath which was worse with exertion and improved with rest. Shortness of breath is associated with cough, intermittently productive with clear to white sputum. Patient also complains of intermittent chest discomfort that she describes as midsternal in location moderate intensity and occurring with deep inspiration or cough. Because of the patient's worsening symptoms she missed her Friday hemodialysis session. Patient shortness of breath and cough continue to worsen and so she eventually presented to [facility] emergency department for evaluation. Upon evaluation in the emergency  department patient was found to have a markedly elevated potassium of 8.0 with peaked T waves on EKG. Case was discussed with Dr. Posey Pronto with nephrology was arranged for emergent dialysis. She was subsequently admitted under hospitalist service. Although patient had some shortness of breath but she was requiring her 2 L of nasal oxygen which is her baseline as she has history of chronic hypoxic respiratory failure secondary to COPD. She was thought to be having acute COPD exacerbation for which she was started on Solu-Medrol and doxycycline. Dialysis helped with improvement of her hyperkalemia. Patient received total 3 sessions of dialysis during her hospitalization. Please note that I saw this patient only today and when I saw her, she was feeling much better without having any other complaints other than just cough with clear sputum. She was still on 2 L of nasal oxygen which is her baseline. Her potassium is normal today. She did not have any wheezes on my exam today. Since she was stable so we will plan to discharge her home and she was in agreement. I also called patient's son/[name] Mounce over the phone and he also was in agreement with the plan of discharge. Patient is being discharged today after having her dialysis. She will be discharged on 3 more days of oral prednisone and doxycycline.  Discharge Diagnoses:  Active Problems:   Benign essential HTN   Acute on chronic diastolic CHF (congestive heart failure) (HCC)   COPD with acute exacerbation (HCC)   Type 2 diabetes mellitus with ESRD (end-stage renal disease) (HCC)   Chronic respiratory failure with hypoxia (HCC)   Mixed diabetic hyperlipidemia associated with type 2 diabetes mellitus ([place])  Chronic gout 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   Acute hyperkalemia   GERD with esophagitis   Acute bacterial bronchitis    Discharge Instructions   Allergies as of 03/08/2020   No Known Allergies     Medication List    TAKE these  medications   acetaminophen 500 MG tablet Commonly known as: TYLENOL Take 500-1,000 mg by mouth every 6 (six) hours as needed (for pain.).   allopurinol 100 MG tablet Commonly known as: ZYLOPRIM Take 1 tablet (100 mg total) by mouth 2 (two) times daily.   amiodarone 200 MG tablet Commonly known as: PACERONE Take 1 tablet (200 mg total) by mouth daily.   aspirin EC 81 MG tablet Take 81 mg by mouth daily.   atorvastatin 40 MG tablet Commonly known as: LIPITOR Take 1 tablet (40 mg total) by mouth at bedtime.   Biotin 5000 MCG Tabs Take 5,000 mcg by mouth daily.   Clear Eyes for Dry Eyes 1-0.25 % Soln Generic drug: Carboxymethylcellul-Glycerin Place 1 drop into both eyes daily as needed (dry eys).   doxycycline 100 MG tablet Commonly known as: ADOXA Take 1 tablet (100 mg total) by mouth 2 (two) times daily for 3 days.   Eliquis 2.5 MG Tabs tablet Generic drug: apixaban Take 2.5 mg by mouth 2 (two) times daily.   esomeprazole 40 MG capsule Commonly known as: NEXIUM Take 40 mg by mouth daily.   famotidine 40 MG tablet Commonly known as: PEPCID TAKE 1 TABLET AT BEDTIME AS NEEDED TO CONTROL REFLUX What changed:   how much to take  how to take this  when to take this  reasons to take this  additional instructions   gabapentin 100 MG capsule Commonly known as: NEURONTIN Take 100 mg by mouth at bedtime.   insulin lispro 100 UNIT/ML injection Commonly known as: HUMALOG Inject 5-16 Units into the skin 3 (three) times daily as needed (greater than 120).   Lantus SoloStar 100 UNIT/ML Solostar Pen Generic drug: insulin glargine Inject 10 Units into the skin daily as needed (over 120 blood sugars).   lidocaine 5 % Commonly known as: Lidoderm Place 1 patch onto the skin daily. Remove & Discard patch within 12 hours or as directed by MD What changed:   when to take this  reasons to take this   nitroGLYCERIN 0.4 MG SL tablet Commonly known as: NITROSTAT Place  0.4 mg under the tongue every 5 (five) minutes x 3 doses as needed for chest pain.   ondansetron 4 MG tablet Commonly known as: ZOFRAN Take 4 mg by mouth daily as needed for nausea/vomiting.   predniSONE 50 MG tablet Commonly known as: DELTASONE Take 1 tablet (50 mg total) by mouth daily with breakfast for 3 days.   tamsulosin 0.4 MG Caps capsule Commonly known as: FLOMAX Take 1 capsule (0.4 mg total) by mouth daily.   Trelegy Ellipta 100-62.5-25 MCG/INH Aepb Generic drug: Fluticasone-Umeclidin-Vilant Inhale 1 puff into the lungs daily as needed (respiratory issues.).   Ventolin HFA 108 (90 Base) MCG/ACT inhaler Generic drug: albuterol Inhale 2 puffs into the lungs every 6 (six) hours as needed for wheezing or shortness of breath.   albuterol (2.5 MG/3ML) 0.083% nebulizer solution Commonly known as: PROVENTIL Inhale 3 mLs into the lungs daily as needed for wheezing or shortness of breath.       Follow-up Information    [name], MD Follow up in 1 week(s).   Specialty: Family Medicine Contact information: [address]  [place] [address] [phone]        Satira Sark, MD .   Specialty: Cardiology Contact information: [place] [address] [phone]              No Known Allergies  Consultations: Nephrology   Procedures/Studies: DG Chest 2 View  Result Date: 03/06/2020 CLINICAL DATA:  Shortness of breath EXAM: CHEST - 2 VIEW COMPARISON:  November 21, 2019 FINDINGS: The heart size and mediastinal contours are unchanged with mild cardiomegaly. Again noted is a right-sided MediPort catheter with the tip at the superior cavoatrial junction and a left-sided central venous catheter. No large airspace consolidation or pleural effusion. Subsegmental atelectasis seen at both lung bases. A hiatal hernia is present. No acute osseous abnormality. IMPRESSION: Subsegmental atelectasis. Stable large hiatal hernia. Electronically  Signed   By: Prudencio Pair M.D.   On: 03/06/2020 02:16      Discharge Exam: Vitals:   03/08/20 0400 03/08/20 0740  BP:  (!) 133/53  Pulse: 82 80  Resp:  16  Temp: 98.7 F (37.1 C) 98 F (36.7 C)  SpO2: 98% 95%   Vitals:   03/08/20 0007 03/08/20 0028 03/08/20 0400 03/08/20 0740  BP: 110/66   (!) 133/53  Pulse:  80 82 80  Resp:  16  16  Temp: 98.7 F (37.1 C)  98.7 F (37.1 C) 98 F (36.7 C)  TempSrc: Oral  Oral Oral  SpO2:  99% 98% 95%  Weight:   70 kg   Height:        General: Pt is alert, awake, not in acute distress Cardiovascular: RRR, S1/S2 +, no rubs, no gallops Respiratory: Coarse breath sounds as she continued to cough while trying to deep breath, no wheezing, no rhonchi Abdominal: Soft, NT, ND, bowel sounds + Extremities: no edema, no cyanosis    The results of significant diagnostics from this hospitalization (including imaging, microbiology, ancillary and laboratory) are listed below for reference.     Microbiology: Recent Results (from the past 240 hour(s))  SARS Coronavirus 2 by RT PCR (hospital order, performed in [place] hospital lab) Nasopharyngeal Nasopharyngeal Swab     Status: None   Collection Time: 03/06/20  2:33 AM   Specimen: Nasopharyngeal Swab  Result Value Ref Range Status   SARS Coronavirus 2 NEGATIVE NEGATIVE Final    Comment: (NOTE) SARS-CoV-2 target nucleic acids are NOT DETECTED.  The SARS-CoV-2 RNA is generally detectable in upper and lower respiratory specimens during the acute phase of infection. The lowest concentration of SARS-CoV-2 viral copies this assay can detect is 250 copies / mL. A negative result does not preclude SARS-CoV-2 infection and should not be used as the sole basis for treatment or other patient management decisions.  A negative result may occur with improper specimen collection / handling, submission of specimen other than nasopharyngeal swab, presence of viral mutation(s) within the areas targeted by  this assay, and inadequate number of viral copies (<250 copies / mL). A negative result must be combined with clinical observations, patient history, and epidemiological information.  Fact Sheet for Patients:   StrictlyIdeas.no  Fact Sheet for Healthcare Providers: BankingDealers.co.za  This test is not yet approved or  cleared by the [place] FDA and has been authorized for detection and/or diagnosis of SARS-CoV-2 by FDA under an Emergency Use Authorization (EUA).  This EUA will remain in effect (meaning this test can be used) for the duration of the COVID-19  declaration under Section 564(b)(1) of the Act, 21 U.S.C. section 360bbb-3(b)(1), unless the authorization is terminated or revoked sooner.  Performed at [facility], [place] [address], [place], [place] [address]   MRSA PCR Screening     Status: None   Collection Time: 03/06/20 10:24 PM   Specimen: Nasopharyngeal  Result Value Ref Range Status   MRSA by PCR NEGATIVE NEGATIVE Final    Comment:        The GeneXpert MRSA Assay (FDA approved for NASAL specimens only), is one component of a comprehensive MRSA colonization surveillance program. It is not intended to diagnose MRSA infection nor to guide or monitor treatment for MRSA infections. Performed at [facility], [place] [address], [place], [place] [address]      Labs: BNP (last 3 results) Recent Labs    06/15/19 1347 07/05/19 1359 03/06/20 0143  BNP 327.0* 321.0* 536.1*   Basic Metabolic Panel: Recent Labs  Lab 03/06/20 0143 03/06/20 0242 03/06/20 0634 03/07/20 0457 03/08/20 0414  NA 140 138 141 134* 138  K >7.5* 8.0* >7.5* 5.8* 4.4  CL 101 104 102 99 98  CO2 24  --  25 24 27   GLUCOSE 174* 157* 58* 133* 153*  BUN 70* 66* 70* 24* 20  CREATININE 10.11* 10.80* 9.93* 5.28* 3.82*  CALCIUM 7.1*  --  7.3* 8.2* 8.7*  MG  --   --   --  2.1  --   PHOS  --   --  10.0*  --   --    Liver Function  Tests: Recent Labs  Lab 03/06/20 0634 03/07/20 0457 03/08/20 0414  AST  --  14* 16  ALT  --  13 15  ALKPHOS  --  63 62  BILITOT  --  1.3* 0.8  PROT  --  6.1* 6.2*  ALBUMIN 3.2* 3.1* 3.4*   No results for input(s): LIPASE, AMYLASE in the last 168 hours. No results for input(s): AMMONIA in the last 168 hours. CBC: Recent Labs  Lab 03/06/20 0143 03/06/20 0242 03/07/20 0457 03/08/20 0414  WBC 7.4  --  4.0 5.4  NEUTROABS  --   --  3.7  --   HGB 11.8* 12.6 10.5* 11.6*  HCT 38.0 37.0 32.8* 35.7*  MCV 97.4  --  94.5 93.7  PLT 185  --  162 179   Cardiac Enzymes: No results for input(s): CKTOTAL, CKMB, CKMBINDEX, TROPONINI in the last 168 hours. BNP: Invalid input(s): POCBNP CBG: Recent Labs  Lab 03/07/20 1016 03/07/20 1113 03/07/20 1714 03/07/20 2047 03/08/20 0738  GLUCAP 127* 131* 137* 211* 155*   D-Dimer No results for input(s): DDIMER in the last 72 hours. Hgb A1c Recent Labs    03/07/20 0457  HGBA1C 5.6   Lipid Profile No results for input(s): CHOL, HDL, LDLCALC, TRIG, CHOLHDL, LDLDIRECT in the last 72 hours. Thyroid function studies No results for input(s): TSH, T4TOTAL, T3FREE, THYROIDAB in the last 72 hours.  Invalid input(s): FREET3 Anemia work up No results for input(s): VITAMINB12, FOLATE, FERRITIN, TIBC, IRON, RETICCTPCT in the last 72 hours. Urinalysis    Component Value Date/Time   COLORURINE YELLOW 07/22/2019 1038   APPEARANCEUR TURBID (A) 07/22/2019 1038   LABSPEC >1.030 (H) 07/22/2019 1038   PHURINE 6.0 07/22/2019 1038   GLUCOSEU NEGATIVE 07/22/2019 1038   HGBUR SMALL (A) 07/22/2019 1038   BILIRUBINUR SMALL (A) 07/22/2019 1038   KETONESUR TRACE (A) 07/22/2019 1038   PROTEINUR 100 (A) 07/22/2019 1038   UROBILINOGEN  0.2 01/29/2007 1500   NITRITE NEGATIVE 07/22/2019 1038   LEUKOCYTESUR MODERATE (A) 07/22/2019 1038   Sepsis Labs Invalid input(s): PROCALCITONIN,  WBC,  LACTICIDVEN Microbiology Recent Results (from the past 240 hour(s))   SARS Coronavirus 2 by RT PCR (hospital order, performed in [facility] hospital lab) Nasopharyngeal Nasopharyngeal Swab     Status: None   Collection Time: 03/06/20  2:33 AM   Specimen: Nasopharyngeal Swab  Result Value Ref Range Status   SARS Coronavirus 2 NEGATIVE NEGATIVE Final    Comment: (NOTE) SARS-CoV-2 target nucleic acids are NOT DETECTED.  The SARS-CoV-2 RNA is generally detectable in upper and lower respiratory specimens during the acute phase of infection. The lowest concentration of SARS-CoV-2 viral copies this assay can detect is 250 copies / mL. A negative result does not preclude SARS-CoV-2 infection and should not be used as the sole basis for treatment or other patient management decisions.  A negative result may occur with improper specimen collection / handling, submission of specimen other than nasopharyngeal swab, presence of viral mutation(s) within the areas targeted by this assay, and inadequate number of viral copies (<250 copies / mL). A negative result must be combined with clinical observations, patient history, and epidemiological information.  Fact Sheet for Patients:   StrictlyIdeas.no  Fact Sheet for Healthcare Providers: BankingDealers.co.za  This test is not yet approved or  cleared by the [place] FDA and has been authorized for detection and/or diagnosis of SARS-CoV-2 by FDA under an Emergency Use Authorization (EUA).  This EUA will remain in effect (meaning this test can be used) for the duration of the COVID-19 declaration under Section 564(b)(1) of the Act, 21 U.S.C. section 360bbb-3(b)(1), unless the authorization is terminated or revoked sooner.  Performed at [facility], [place] [address], [place], [place] [address]   MRSA PCR Screening     Status: None   Collection Time: 03/06/20 10:24 PM   Specimen: Nasopharyngeal  Result Value Ref Range Status   MRSA by PCR NEGATIVE NEGATIVE  Final    Comment:        The GeneXpert MRSA Assay (FDA approved for NASAL specimens only), is one component of a comprehensive MRSA colonization surveillance program. It is not intended to diagnose MRSA infection nor to guide or monitor treatment for MRSA infections. Performed at [facility], [place] [name], [place], [place] [address]      Time coordinating discharge: Over 30 minutes  SIGNED:   Darliss Cheney, MD  [facility] 03/08/2020, 10:58 AM  If 7PM-7AM, please contact night-coverage [email or web address]

## 2020-03-08 NOTE — Progress Notes (Addendum)
[facility] Progress Note   Subjective:   Patient seen and examined at bedside. Continues to have cough but improved a little today.  Shortness of breath improved, back down to baseline O2 - 2L.  Denies CP, n/v/d, abdominal pain, dizziness and fatigue.  Tolerated dialysis well yesterday.    Objective Vitals:   03/08/20 0007 03/08/20 0028 03/08/20 0400 03/08/20 0740  BP: 110/66   (!) 133/53  Pulse:  80 82 80  Resp:  16  16  Temp: 98.7 F (37.1 C)  98.7 F (37.1 C) 98 F (36.7 C)  TempSrc: Oral  Oral Oral  SpO2:  99% 98% 95%  Weight:   70 kg   Height:       Physical Exam General:WDWN female in NAD Heart:RRR Lungs:CTAB anteriolaterally Abdomen:soft, NTND Extremities:no LE edema Dialysis Access: TDC, LU AVF +b   Filed Weights   03/07/20 0444 03/07/20 1223 03/08/20 0400  Weight: 83 kg 77.4 kg 70 kg    Intake/Output Summary (Last 24 hours) at 03/08/2020 0843 Last data filed at 03/08/2020 0116 Gross per 24 hour  Intake 654.58 ml  Output 2000 ml  Net -1345.42 ml    Additional Objective Labs: Basic Metabolic Panel: Recent Labs  Lab 03/06/20 0634 03/07/20 0457 03/08/20 0414  NA 141 134* 138  K >7.5* 5.8* 4.4  CL 102 99 98  CO2 25 24 27   GLUCOSE 58* 133* 153*  BUN 70* 24* 20  CREATININE 9.93* 5.28* 3.82*  CALCIUM 7.3* 8.2* 8.7*  PHOS 10.0*  --   --    Liver Function Tests: Recent Labs  Lab 03/06/20 0634 03/07/20 0457 03/08/20 0414  AST  --  14* 16  ALT  --  13 15  ALKPHOS  --  63 62  BILITOT  --  1.3* 0.8  PROT  --  6.1* 6.2*  ALBUMIN 3.2* 3.1* 3.4*   CBC: Recent Labs  Lab 03/06/20 0143 03/06/20 0143 03/06/20 0242 03/07/20 0457 03/08/20 0414  WBC 7.4  --   --  4.0 5.4  NEUTROABS  --   --   --  3.7  --   HGB 11.8*   < > 12.6 10.5* 11.6*  HCT 38.0   < > 37.0 32.8* 35.7*  MCV 97.4  --   --  94.5 93.7  PLT 185  --   --  162 179   < > = values in this interval not displayed.   CBG: Recent Labs  Lab 03/07/20 1016 03/07/20 1113  03/07/20 1714 03/07/20 2047 03/08/20 0738  GLUCAP 127* 131* 137* 211* 155*    Lab Results  Component Value Date   INR 1.0 02/10/2020   INR 1.4 (H) 08/05/2019   INR 1.0 10/25/2008   Studies/Results: No results found.  Medications: . sodium chloride    . sodium chloride    . doxycycline (VIBRAMYCIN) IV 100 mg (03/07/20 2357)  . ferric gluconate (FERRLECIT/NULECIT) IV     . allopurinol  100 mg Oral BID  . amiodarone  200 mg Oral Daily  . apixaban  2.5 mg Oral BID  . aspirin EC  81 mg Oral Daily  . atorvastatin  40 mg Oral QHS  . calcium acetate  1,334 mg Oral TID WC  . Chlorhexidine Gluconate Cloth  6 each Topical Q0600  . Chlorhexidine Gluconate Cloth  6 each Topical Q0600  . Chlorhexidine Gluconate Cloth  6 each Topical Q0600  . feeding supplement (NEPRO CARB STEADY)  237 mL Oral BID BM  .  gabapentin  100 mg Oral QHS  . insulin aspart  0-15 Units Subcutaneous TID AC & HS  . insulin glargine  10 Units Subcutaneous Daily  . ipratropium-albuterol  3 mL Nebulization TID  . methylPREDNISolone (SOLU-MEDROL) injection  60 mg Intravenous BID  . multivitamin  1 tablet Oral QHS  . pantoprazole  40 mg Oral Daily  . tamsulosin  0.4 mg Oral Daily    Dialysis Orders: MWF - [facility]  4hrs, BFR 400, DFR 800,  EDW 78.5, 2K/ 2.5Ca  Access: Left upper AVF (can be used for dialysis) and TDC Heparin: No  Venofer 100 qHD x 10 - 1 completed. Calcitriol 0.39mcg PO qHD  Assessment/Plan: 1.  Acute hyperkalemia - Resolved after HDx2.  K 4.4.  Chronically high as outpatient.  Discussed adherence to low K diet.  2. Respiratory distress - COPD exacerbation/acute bacterial bronchitis and increased volume - now improved. Likely has had weight loss and needs lower dry.   On bronchodilator therapy, IV systemic steroids, and antibiotics per primary.  3.  ESRD -  HD MWF. HD today per regular schedule, shortened to 3hrs d/t serial dialysis last 2 days.  4.  Hypertension/volume  - Patient has been  normotensive in the hospital. Not currently on antihypertensive medications. Under EDW post HD yesterday, post HD weight 77.4kg.  Volume status improved, likely needs lower dry on d/c.  5.  Anemia of CKD - Hgb 11.6. No indication for ESA at this time.  On IV iron as OP, will order to continue while admitted.  6.  Secondary Hyperparathyroidism -  Corrected Ca is within goal. Phosphorus of 10.0.  Continue Calcitriol, calcium acetate. Continue to monitor labs.  7.  Nutrition - Albumin 3.4. Continue renal/carb modified diet with fluid restriction. +protein supplement.   Jen Mow, PA-C [facility] 03/08/2020,8:43 AM  LOS: 2 days   I have seen and examined this patient and agree with plan and assessment in the above note with renal recommendations/intervention highlighted.  Improved volume and potassium with extra HD session.  Plan for another session today. Broadus [name] A Tymere Depuy,MD 03/08/2020 1:30 PM

## 2020-03-08 NOTE — Evaluation (Signed)
Physical Therapy Evaluation Patient Details Name: [name] MRN: [id number] DOB: [birth year] Today's Date: 03/08/2020   History of Present Illness  74 year old female s/p SOB x1 week with chest discomfort found to have acute hyperkalemia with missed dailysis, COPDe and CHFe. PMHx: ESRD (MWF schedule), CAD, obstructive sleep apnea (on BIPAP QHS), HLD, GERD, DMT2, obesity, COPD, AFib.  Clinical Impression  Pt was seen for mobility on RW, noted her difficulty with mild SOB but is able to maneuver with min guard safely.  Her ability to handle standing balance challenges with BR trip was encouraging, and should be well prepared to handle the tasks of home with HHPT to follow up.  See acutely to work on goals of therapy, esp with gait distances with sat ck.    Follow Up Recommendations Home health PT    Equipment Recommendations  None recommended by PT    Recommendations for Other Services       Precautions / Restrictions Precautions Precautions: Fall Precaution Comments: monitor vitals Restrictions Weight Bearing Restrictions: No      Mobility  Bed Mobility Overal bed mobility: Needs Assistance Bed Mobility: Supine to Sit     Supine to sit: Min assist     General bed mobility comments: minimal support to get trunk out to EOB  Transfers Overall transfer level: Needs assistance Equipment used: Rolling walker (2 wheeled);1 person hand held assist Transfers: Sit to/from Stand Sit to Stand: Min guard         General transfer comment: min guard as a safety precaution'  Ambulation/Gait Ambulation/Gait assistance: Min guard Gait Distance (Feet): 45 Feet Assistive device: Rolling walker (2 wheeled) Gait Pattern/deviations: Step-through pattern;Wide base of support;Trunk flexed Gait velocity: reduced Gait velocity interpretation: <1.31 ft/sec, indicative of household ambulator General Gait Details: pt is taking her time on RW, very stable and able to maintain O2  sats  Stairs            Wheelchair Mobility    Modified Rankin (Stroke Patients Only)       Balance Overall balance assessment: Needs assistance Sitting-balance support: Feet supported Sitting balance-Leahy Scale: Good     Standing balance support: Bilateral upper extremity supported;During functional activity Standing balance-Leahy Scale: Fair Standing balance comment: able to get clothing up in BR with no PT assistance in standing                             Pertinent Vitals/Pain Pain Assessment: No/denies pain    Home Living Family/patient expects to be discharged to:: Private residence Living Arrangements: Children Available Help at Discharge: Family;Available PRN/intermittently Type of Home: House Home Access: Level entry     Home Layout: One level Home Equipment: Walker - 2 wheels;Shower seat;Bedside commode      Prior Function Level of Independence: Independent with assistive device(s)         Comments: walking on RW at home with son     Hand Dominance   Dominant Hand: Right    Extremity/Trunk Assessment   Upper Extremity Assessment Upper Extremity Assessment: Defer to OT evaluation    Lower Extremity Assessment Lower Extremity Assessment: Overall WFL for tasks assessed    Cervical / Trunk Assessment Cervical / Trunk Assessment: Kyphotic (mild)  Communication   Communication: No difficulties  Cognition Arousal/Alertness: Awake/alert Behavior During Therapy: WFL for tasks assessed/performed Overall Cognitive Status: Within Functional Limits for tasks assessed  General Comments General comments (skin integrity, edema, etc.): Pt is up to walk on RW with supplemental O2 at home level, up to chair and no LOB in any transition, SOB mildly but sats stablr    Exercises     Assessment/Plan    PT Assessment Patient needs continued PT services  PT Problem List Decreased  range of motion;Decreased balance;Decreased activity tolerance;Decreased safety awareness;Cardiopulmonary status limiting activity       PT Treatment Interventions DME instruction;Gait training;Functional mobility training;Therapeutic activities;Therapeutic exercise;Balance training;Neuromuscular re-education;Patient/family education    PT Goals (Current goals can be found in the Care Plan section)  Acute Rehab PT Goals Patient Stated Goal: to be home soon and feel better PT Goal Formulation: With patient Time For Goal Achievement: 03/15/20 Potential to Achieve Goals: Good    Frequency Min 3X/week   Barriers to discharge   has level home with son to assist    Co-evaluation               AM-PAC PT "6 Clicks" Mobility  Outcome Measure Help needed turning from your back to your side while in a flat bed without using bedrails?: None Help needed moving from lying on your back to sitting on the side of a flat bed without using bedrails?: A Little Help needed moving to and from a bed to a chair (including a wheelchair)?: A Little Help needed standing up from a chair using your arms (e.g., wheelchair or bedside chair)?: A Little Help needed to walk in hospital room?: A Little Help needed climbing 3-5 steps with a railing? : A Lot 6 Click Score: 18    End of Session Equipment Utilized During Treatment: Oxygen;Gait belt Activity Tolerance: Patient limited by fatigue Patient left: in chair;with call bell/phone within reach;with chair alarm set Nurse Communication: Mobility status PT Visit Diagnosis: Unsteadiness on feet (R26.81);Difficulty in walking, not elsewhere classified (R26.2)    Time: 1010-1039 PT Time Calculation (min) (ACUTE ONLY): 29 min   Charges:   PT Evaluation $PT Eval Moderate Complexity: 1 Mod PT Treatments $Gait Training: 8-22 mins       Ramond Dial 03/08/2020, 11:26 AM  Mee Hives, PT MS Acute Rehab Dept. Number: [place] [facility] [place]

## 2020-03-08 NOTE — Progress Notes (Signed)
Nsg Discharge Note  Admit Date:  03/06/2020 Discharge date: 03/08/2020   [name] to be D/C'd home per MD order.  AVS completed. Patient/caregiver able to verbalize understanding.  Discharge Medication: Allergies as of 03/08/2020   No Known Allergies     Medication List    TAKE these medications   acetaminophen 500 MG tablet Commonly known as: TYLENOL Take 500-1,000 mg by mouth every 6 (six) hours as needed (for pain.).   allopurinol 100 MG tablet Commonly known as: ZYLOPRIM Take 1 tablet (100 mg total) by mouth 2 (two) times daily.   amiodarone 200 MG tablet Commonly known as: PACERONE Take 1 tablet (200 mg total) by mouth daily.   aspirin EC 81 MG tablet Take 81 mg by mouth daily.   atorvastatin 40 MG tablet Commonly known as: LIPITOR Take 1 tablet (40 mg total) by mouth at bedtime.   Biotin 5000 MCG Tabs Take 5,000 mcg by mouth daily.   Clear Eyes for Dry Eyes 1-0.25 % Soln Generic drug: Carboxymethylcellul-Glycerin Place 1 drop into both eyes daily as needed (dry eys).   doxycycline 100 MG tablet Commonly known as: ADOXA Take 1 tablet (100 mg total) by mouth 2 (two) times daily for 3 days.   Eliquis 2.5 MG Tabs tablet Generic drug: apixaban Take 2.5 mg by mouth 2 (two) times daily.   esomeprazole 40 MG capsule Commonly known as: NEXIUM Take 40 mg by mouth daily.   famotidine 40 MG tablet Commonly known as: PEPCID TAKE 1 TABLET AT BEDTIME AS NEEDED TO CONTROL REFLUX What changed:   how much to take  how to take this  when to take this  reasons to take this  additional instructions   gabapentin 100 MG capsule Commonly known as: NEURONTIN Take 100 mg by mouth at bedtime.   insulin lispro 100 UNIT/ML injection Commonly known as: HUMALOG Inject 5-16 Units into the skin 3 (three) times daily as needed (greater than 120).   Lantus SoloStar 100 UNIT/ML Solostar Pen Generic drug: insulin glargine Inject 10 Units into the skin daily as  needed (over 120 blood sugars).   lidocaine 5 % Commonly known as: Lidoderm Place 1 patch onto the skin daily. Remove & Discard patch within 12 hours or as directed by MD What changed:   when to take this  reasons to take this   nitroGLYCERIN 0.4 MG SL tablet Commonly known as: NITROSTAT Place 0.4 mg under the tongue every 5 (five) minutes x 3 doses as needed for chest pain.   ondansetron 4 MG tablet Commonly known as: ZOFRAN Take 4 mg by mouth daily as needed for nausea/vomiting.   predniSONE 50 MG tablet Commonly known as: DELTASONE Take 1 tablet (50 mg total) by mouth daily with breakfast for 3 days.   tamsulosin 0.4 MG Caps capsule Commonly known as: FLOMAX Take 1 capsule (0.4 mg total) by mouth daily.   Trelegy Ellipta 100-62.5-25 MCG/INH Aepb Generic drug: Fluticasone-Umeclidin-Vilant Inhale 1 puff into the lungs daily as needed (respiratory issues.).   Ventolin HFA 108 (90 Base) MCG/ACT inhaler Generic drug: albuterol Inhale 2 puffs into the lungs every 6 (six) hours as needed for wheezing or shortness of breath.   albuterol (2.5 MG/3ML) 0.083% nebulizer solution Commonly known as: PROVENTIL Inhale 3 mLs into the lungs daily as needed for wheezing or shortness of breath.       Discharge Assessment: Vitals:   03/08/20 1700 03/08/20 1733  BP: (!) 137/57 (!) 120/41  Pulse: 77 76  Resp: 19 13  Temp: 98.2 F (36.8 C) 98 F (36.7 C)  SpO2: 99% 97%   Skin clean, dry and intact without evidence of skin break down, no evidence of skin tears noted. IV catheter discontinued intact. Site without signs and symptoms of complications - no redness or edema noted at insertion site, patient denies c/o pain - only slight tenderness at site.  Dressing with slight pressure applied.  D/c Instructions-Education: Discharge instructions given to patient/family with verbalized understanding. D/c education completed with patient/family including follow up instructions, medication  list, d/c activities limitations if indicated, with other d/c instructions as indicated by MD - patient able to verbalize understanding, all questions fully answered. Patient instructed to return to ED, call 911, or call MD for any changes in condition.  Patient escorted via [place], and D/C home via private auto.  Atilano Ina, RN 03/08/2020 6:15 PM

## 2020-03-08 NOTE — Evaluation (Signed)
Occupational Therapy Evaluation Patient Details Name: [name] MRN: [id number] DOB: Jun 15, 1946 Today's Date: 03/08/2020    History of Present Illness 74 year old female s/p SOB x1 week with chest discomfort found to have acute hyperkalemia with missed dailysis, COPDe and CHFe. PMHx: ESRD (MWF schedule), CAD, obstructive sleep apnea (on BIPAP QHS), HLD, GERD, DMT2, obesity, COPD, AFib.   Clinical Impression   PTA pt living with son and functioning at mod I level. At time of eval, pt presents with ability to complete bed mobility and sit <> stands at supervision level with RW. Pt is currently completing BADL at mod I level. Educated pt on ECS strategies and management of home O2 levels. Pt was familiar with this education and able to teach back to therapist. Encouraged pt to wear 2L [place] due to SpO2 sats dropping on RA (baseline wears it supplemental). OT will sign off at this time for anticipated d/c this afternoon, thank you for this consult.      Follow Up Recommendations  No OT follow up;Supervision - Intermittent    Equipment Recommendations  None recommended by OT    Recommendations for Other Services       Precautions / Restrictions Precautions Precautions: Fall      Mobility Bed Mobility Overal bed mobility: Needs Assistance Bed Mobility: Sit to Supine       Sit to supine: Supervision      Transfers Overall transfer level: Needs assistance Equipment used: Rolling walker (2 wheeled) Transfers: Sit to/from Stand Sit to Stand: Supervision         General transfer comment: for safety    Balance Overall balance assessment: Needs assistance Sitting-balance support: Feet supported Sitting balance-Leahy Scale: Good     Standing balance support: Bilateral upper extremity supported;During functional activity Standing balance-Leahy Scale: Fair                             ADL either performed or assessed with clinical judgement   ADL Overall  ADL's : Modified independent                                       General ADL Comments: Pt demonstrated ability to complete BADL At mod I level. Pt demonstrated toilet transfer, LB dressing, and functional mobility without external assist. Pt uses RW for ECS and stability at baseline. Discussed ECS strategies and monitoring O2 levels at home. Pt able to teach back these methods without cueing from therapist     Vision Patient Visual Report: No change from baseline       Perception     Praxis      Pertinent Vitals/Pain Pain Assessment: No/denies pain     Hand Dominance     Extremity/Trunk Assessment Upper Extremity Assessment Upper Extremity Assessment: Overall WFL for tasks assessed   Lower Extremity Assessment Lower Extremity Assessment: Overall WFL for tasks assessed       Communication     Cognition Arousal/Alertness: Awake/alert Behavior During Therapy: WFL for tasks assessed/performed Overall Cognitive Status: Within Functional Limits for tasks assessed                                     General Comments  VSS on 2L [place]    Exercises     Shoulder Instructions  Home Living                                          Prior Functioning/Environment                   OT Problem List: Decreased knowledge of use of DME or AE;Decreased knowledge of precautions;Decreased activity tolerance;Cardiopulmonary status limiting activity      OT Treatment/Interventions:      OT Goals(Current goals can be found in the care plan section) Acute Rehab OT Goals Patient Stated Goal: return home today OT Goal Formulation: All assessment and education complete, DC therapy  OT Frequency:     Barriers to D/C:            Co-evaluation              AM-PAC OT "6 Clicks" Daily Activity     Outcome Measure Help from another person eating meals?: None Help from another person taking care of personal grooming?:  None Help from another person toileting, which includes using toliet, bedpan, or urinal?: None Help from another person bathing (including washing, rinsing, drying)?: None Help from another person to put on and taking off regular upper body clothing?: None Help from another person to put on and taking off regular lower body clothing?: None 6 Click Score: 24   End of Session Equipment Utilized During Treatment: Gait belt;Rolling walker;Oxygen Nurse Communication: Mobility status  Activity Tolerance: Patient tolerated treatment well Patient left: in bed  OT Visit Diagnosis: Other abnormalities of gait and mobility (R26.89);Unsteadiness on feet (R26.81)                Time: 6270-3500 OT Time Calculation (min): 10 min Charges:  OT General Charges $OT Visit: 1 Visit OT Evaluation $OT Eval Moderate Complexity: [place], MSOT, OTR/L [facility] Office Number: [phone] Pager: [phone]  Zenovia Jarred 03/08/2020, 5:57 PM

## 2020-03-08 NOTE — TOC Initial Note (Signed)
Transition of Care [facility]) - Initial/Assessment Note    Patient Details  Name: [name] MRN: [id number] Date of Birth: [birth year]  Transition of Care [facility]) CM/SW Contact:    Bethena Roys, RN Phone Number: 03/08/2020, 12:42 PM  Clinical Narrative: High risk for readmission assessment completed. Patient presented for cough. Prior to arrival patient was from home with support of grandson. Patient has a primary care provider and she uses [facility] representative. Patient has transportation to and from appointments. Physical Therapy consulted and recommendations were for Home Health Physical Therapy- patient has declined services at this time. Patient states she will not need services at this time. Patient is aware to contact her primary care provider if she needs services in the the future. Patient has BIPAP via [employer] and oxygen via [employer]. Family members to bring a portable 02 tank for home. No further needs from Case Manager at this time.               Expected Discharge Plan: Home/Self Care (Patient declined home health services.) Barriers to Discharge: No Barriers Identified   Patient Goals and CMS Choice Patient states their goals for this hospitalization and ongoing recovery are:: to return home   Choice offered to / list presented to : NA  Expected Discharge Plan and Services Expected Discharge Plan: Home/Self Care (Patient declined home health services.) In-house Referral: NA Discharge Planning Services: CM Consult Post Acute Care Choice: Home Health (patient declined) Living arrangements for the past 2 months: Single Family Home Expected Discharge Date: 03/08/20               DME Arranged: N/A DME Agency: NA       HH Arranged: Refused [place] Agency: NA        Prior Living Arrangements/Services Living arrangements for the past 2 months: [place] Lives with:: Relatives Patient language and need for interpreter reviewed:: Yes Do you  feel safe going back to the place where you live?: Yes      Need for Family Participation in Patient Care: Yes (Comment) Care giver support system in place?: Yes (comment)   Criminal Activity/Legal Involvement Pertinent to Current Situation/Hospitalization: No - Comment as needed  Activities of Daily Living Home Assistive Devices/Equipment: CBG Meter, Walker (specify type), Built-in shower seat ADL Screening (condition at time of admission) Patient's cognitive ability adequate to safely complete daily activities?: Yes Is the patient deaf or have difficulty hearing?: No Does the patient have difficulty seeing, even when wearing glasses/contacts?: No Does the patient have difficulty concentrating, remembering, or making decisions?: No Patient able to express need for assistance with ADLs?: Yes Does the patient have difficulty dressing or bathing?: No Independently performs ADLs?: Yes (appropriate for developmental age) Does the patient have difficulty walking or climbing stairs?: Yes Weakness of Legs: None Weakness of Arms/Hands: None  Permission Sought/Granted Permission sought to share information with : Family Supports, Case Manager    Emotional Assessment Appearance:: Appears stated age Attitude/Demeanor/Rapport: Engaged Affect (typically observed): Appropriate Orientation: : Oriented to Situation, Oriented to Place, Oriented to  Time, Oriented to Self Alcohol / Substance Use: Not Applicable Psych Involvement: No (comment)  Admission diagnosis:  Hyperkalemia [E87.5] Acute hyperkalemia [E87.5] End-stage renal disease on hemodialysis ([place]) [N18.6, Z99.2] Dyspnea, unspecified type [R06.00] Patient Active Problem List   Diagnosis Date Noted  . Acute hyperkalemia 03/06/2020  . GERD with esophagitis 03/06/2020  . Acute bacterial bronchitis 03/06/2020  . Loss of weight 10/22/2019  . Goals of  care, counseling/discussion 07/23/2019  . 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 07/21/2019   . Paroxysmal atrial fibrillation ([place]) 07/21/2019  . Encephalopathy 07/21/2019  . Moderate major depression, single episode ([place]) 07/21/2019  . Pressure injury of skin 07/20/2019  . Anorexia 07/10/2019  . Weight gain with edema 07/02/2019  . 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 Neuropathy due to type 2 diabetes mellitus ([place]) 06/22/2019  . Mixed diabetic hyperlipidemia associated with type 2 diabetes mellitus ([place]) 06/22/2019  . Chronic gout 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 06/22/2019  . Quality of life palliative care encounter 06/19/2019  . Atrial fibrillation with RVR ([place])   . Atrial flutter with rapid ventricular response ([place]) 06/16/2019  . Dyspnea and respiratory abnormalities 06/01/2019  . Acute on chronic respiratory failure with hypoxia and hypercapnia with respiratory acidosis 06/01/2019  . Anasarca 05/31/2019  . Nausea without vomiting 05/31/2019  . Generalized abdominal pain 05/31/2019  . History of colonic polyps 05/31/2019  . Lymphedema of both lower extremities 04/28/2019  . Chronic respiratory failure with hypoxia ([place]) 07/13/2018  . Acute renal failure superimposed on stage 3 chronic kidney disease ([place]) 07/13/2018  . Chronic diastolic CHF (congestive heart failure) ([place]) 07/13/2018  . COPD (chronic obstructive pulmonary disease) ([place]) 07/13/2018  . Flatulence 09/18/2017  . IBS (irritable bowel syndrome) 04/03/2017  . Acute on chronic diastolic CHF (congestive heart failure) ([place]) 05/08/2016  . COPD with acute exacerbation ([place]) 05/08/2016  . Constipation 04/25/2016  . Peripheral vertigo 06/27/2015  . Port-A-Cath in place 10/06/2014  . Chronic obstructive pulmonary disease ([place]) 04/30/2014  . Obesity, Class III, BMI 40-49.9 (morbid obesity) ([place]) 04/30/2014  . Hemorrhoids, internal, with bleeding 11/10/2013  . Anemia 08/18/2013  . Hiatal hernia with  GERD and esophagitis   . Obstructive chronic bronchitis without exacerbation ([place]) 12/26/2009  . Dysphagia 12/26/2009  . DM type 2 with diabetic peripheral neuropathy ([place]) 12/25/2009  . Vitamin B deficiency 12/25/2009  . Iron deficiency anemia due to chronic blood loss 12/25/2009  . Benign essential HTN 12/25/2009  . DEGENERATIVE DISC DISEASE 12/25/2009  . OSA (obstructive sleep apnea) 12/25/2009  . HLD (hyperlipidemia) 12/25/2009  . Type 2 diabetes mellitus with ESRD (end-stage renal disease) ([place]) 12/25/2009   PCP:  [name], MD Pharmacy:   [facility] - [address], [place] [address] [place] [address] Phone: [phone] Fax: [phone]  [facility] [place], [place] [address] Phone: [phone] Fax: [phone]    Readmission Risk Interventions Readmission Risk Prevention Plan 03/08/2020 07/19/2019 07/08/2019  Transportation Screening Complete Complete Complete  PCP or Specialist Appt within 3-5 Days - - -  Not Complete comments - - -  HRI or [place] or Home Care Consult comments - - -  Social Work Consult for [place] Planning/Counseling - - -  [place] - - -  Medication Review ([place]) Complete Complete Complete  PCP or Specialist appointment within 3-5 days of discharge Complete Complete Not Complete  PCP/Specialist Appt Not Complete comments - - Patient will be seen by medical director at [facility]  [facility] or Home Care Consult Complete Complete Complete  SW Recovery Care/Counseling Consult Complete Complete Complete  Palliative Care Screening Not Applicable Not Applicable Not  Applicable  Skilled Nursing Facility Not Applicable Complete Complete  Some recent data might be hidden

## 2020-03-09 ENCOUNTER — Telehealth: Payer: Self-pay | Admitting: Nephrology

## 2020-03-09 DIAGNOSIS — E8770 Fluid overload, unspecified: Secondary | ICD-10-CM | POA: Insufficient documentation

## 2020-03-09 DIAGNOSIS — R0603 Acute respiratory distress: Secondary | ICD-10-CM | POA: Insufficient documentation

## 2020-03-09 NOTE — Telephone Encounter (Signed)
Transition of Care Contact from [place]   Date of Discharge: 03/08/20 Date of Contact: 03/09/20 Method of contact: phone Talked to patient   Patient contacted to discuss transition of care form recent hospitaliztion. Patient was admitted to [facility] from 03/06/20 to 03/08/20 with the discharge diagnosis of hyperkalemia and respiratory distress.    Medication changes were reviewed - Doxycycline 100mg  BID, advised not to take at same time as binders.   Patient will follow up with is outpatient dialysis center 03/10/20.   Other follow up needs include: none identified.   Jen Mow, PA-C [facility] Pager: [phone]

## 2020-03-31 ENCOUNTER — Ambulatory Visit: Payer: Medicare Other | Admitting: Cardiology

## 2020-04-07 ENCOUNTER — Emergency Department (HOSPITAL_COMMUNITY): Payer: Medicare Other

## 2020-04-07 ENCOUNTER — Emergency Department (HOSPITAL_COMMUNITY)
Admission: EM | Admit: 2020-04-07 | Discharge: 2020-04-07 | Disposition: A | Discharge status: Home / Self Care | Payer: Medicare Other | Attending: Emergency Medicine | Admitting: Emergency Medicine

## 2020-04-07 ENCOUNTER — Other Ambulatory Visit: Payer: Self-pay

## 2020-04-07 DIAGNOSIS — J449 Chronic obstructive pulmonary disease, unspecified: Secondary | ICD-10-CM | POA: Insufficient documentation

## 2020-04-07 DIAGNOSIS — Z87891 Personal history of nicotine dependence: Secondary | ICD-10-CM | POA: Diagnosis not present

## 2020-04-07 DIAGNOSIS — I509 Heart failure, unspecified: Secondary | ICD-10-CM | POA: Insufficient documentation

## 2020-04-07 DIAGNOSIS — M25521 Pain in right elbow: Secondary | ICD-10-CM | POA: Insufficient documentation

## 2020-04-07 DIAGNOSIS — Z992 Dependence on renal dialysis: Secondary | ICD-10-CM | POA: Diagnosis not present

## 2020-04-07 DIAGNOSIS — N186 End stage renal disease: Secondary | ICD-10-CM | POA: Insufficient documentation

## 2020-04-07 DIAGNOSIS — M542 Cervicalgia: Secondary | ICD-10-CM | POA: Insufficient documentation

## 2020-04-07 DIAGNOSIS — I132 Hypertensive heart and chronic kidney disease with heart failure and with stage 5 chronic kidney disease, or end stage renal disease: Secondary | ICD-10-CM | POA: Insufficient documentation

## 2020-04-07 DIAGNOSIS — Z8541 Personal history of malignant neoplasm of cervix uteri: Secondary | ICD-10-CM | POA: Diagnosis not present

## 2020-04-07 DIAGNOSIS — I2510 Atherosclerotic heart disease of native coronary artery without angina pectoris: Secondary | ICD-10-CM | POA: Insufficient documentation

## 2020-04-07 DIAGNOSIS — R55 Syncope and collapse: Secondary | ICD-10-CM | POA: Insufficient documentation

## 2020-04-07 DIAGNOSIS — E1122 Type 2 diabetes mellitus with diabetic chronic kidney disease: Secondary | ICD-10-CM | POA: Diagnosis not present

## 2020-04-07 LAB — COMPREHENSIVE METABOLIC PANEL
ALT: 15 U/L (ref 0–44)
AST: 19 U/L (ref 15–41)
Albumin: 3.3 g/dL — ABNORMAL LOW (ref 3.5–5.0)
Alkaline Phosphatase: 68 U/L (ref 38–126)
Anion gap: 13 (ref 5–15)
BUN: 9 mg/dL (ref 8–23)
CO2: 29 mmol/L (ref 22–32)
Calcium: 8.9 mg/dL (ref 8.9–10.3)
Chloride: 94 mmol/L — ABNORMAL LOW (ref 98–111)
Creatinine, Ser: 3.16 mg/dL — ABNORMAL HIGH (ref 0.44–1.00)
GFR calc Af Amer: 16 mL/min — ABNORMAL LOW (ref >60)
GFR calc non Af Amer: 14 mL/min — ABNORMAL LOW (ref >60)
Glucose, Bld: 110 mg/dL — ABNORMAL HIGH (ref 70–99)
Potassium: 4.2 mmol/L (ref 3.5–5.1)
Sodium: 136 mmol/L (ref 135–145)
Total Bilirubin: 0.9 mg/dL (ref 0.3–1.2)
Total Protein: 6.6 g/dL (ref 6.5–8.1)

## 2020-04-07 LAB — TROPONIN I (HIGH SENSITIVITY)
Troponin I (High Sensitivity): 12 ng/L (ref <18)
Troponin I (High Sensitivity): 11 ng/L (ref <18)

## 2020-04-07 LAB — CBC
HCT: 36.3 % (ref 36.0–46.0)
Hemoglobin: 11.3 g/dL — ABNORMAL LOW (ref 12.0–15.0)
MCH: 29.7 pg (ref 26.0–34.0)
MCHC: 31.1 g/dL (ref 30.0–36.0)
MCV: 95.5 fL (ref 80.0–100.0)
Platelets: 175 10*3/uL (ref 150–400)
RBC: 3.8 MIL/uL — ABNORMAL LOW (ref 3.87–5.11)
RDW: 15.3 % (ref 11.5–15.5)
WBC: 6.1 10*3/uL (ref 4.0–10.5)
nRBC: 0 % (ref 0.0–0.2)

## 2020-04-07 MED ORDER — SODIUM CHLORIDE 0.9 % IV BOLUS: 250.0000 mL | Freq: Once | INTRAVENOUS | Status: DC

## 2020-04-07 NOTE — Discharge Instructions (Addendum)
You were evaluated in the Emergency Department and after careful evaluation, we did not find any emergent condition requiring admission or further testing in the hospital.  Your exam/testing today was overall reassuring.  We suspect that your fall was related to becoming lightheaded after dialysis.  Please return to the Emergency Department if you experience any worsening of your condition.  Thank you for allowing [place] to be a part of your care.

## 2020-04-07 NOTE — ED Notes (Signed)
Patient verbalizes understanding of discharge instructions. Opportunity for questioning and answers were provided. Armband removed by staff, pt discharged from ED by wheelchair with family member.

## 2020-04-07 NOTE — ED Provider Notes (Signed)
[facility] Emergency Department Provider Note MRN:  [id number]  Arrival date & time: 04/07/20     Chief Complaint   Syncope History of Present Illness   [name] is a 74 y.o. year-old female with a history of CHF, COPD, ovarian cancer presenting to the ED with chief complaint of syncope.  Patient had just finished dialysis, was sitting on the bench waiting for her son to pick her up.  Patient began feeling lightheaded and then passed out, woke up on the ground.  Unsure of head trauma, endorsing mild neck pain, right elbow pain, denies chest pain or shortness of breath before or after the syncopal episode, no abdominal pain, no injuries to the legs, no numbness or weakness.  Patient explains that it is very common for her to feel lightheaded after dialysis.  Review of Systems  A complete 10 system review of systems was obtained and all systems are negative except as noted in the HPI and PMH.   Patient's Health History    Past Medical History:  Diagnosis Date  . Blood transfusion without reported diagnosis   . CAD (coronary artery disease)    Nonobstructive at cardiac catheterization 2000  . Cataract   . Cervical cancer ([place]) 1978  . CHF (congestive heart failure) ([place])   . Chronic back pain   . Chronic kidney disease    MWF dialysis  . COPD (chronic obstructive pulmonary disease) ([place])   . Degenerative disc disease   . Dysrhythmia    a-fib  . Essential hypertension, benign   . GERD (gastroesophageal reflux disease)   . Gout   . Hiatal hernia 07/27/2013  . History of diverticulitis of colon   . History of hiatal hernia   . Iron deficiency anemia   . Irritable bowel syndrome   . Lumbar radiculopathy   . Mixed hyperlipidemia   . 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 Neuropathy   . Osteoporosis   . Ovarian cancer [facility]) 1978   patient denies. States this was cervical cancer  . Oxygen deficiency    room air  .  Sleep apnea   . Type 2 diabetes mellitus ([place])   . Vitamin B deficiency 12/25/2009  . Vitamin B12 deficiency     Past Surgical History:  Procedure Laterality Date  . ABDOMINAL HYSTERECTOMY    . AV FISTULA PLACEMENT Left 08/05/2019   Procedure: ARTERIOVENOUS (AV) FISTULA CREATION LEFT ARM;  Surgeon: Waynetta Sandy, MD;  Location: [place];  Service: Vascular;  Laterality: Left;  . BACK SURGERY    . BASCILIC VEIN TRANSPOSITION Left 10/05/2019   Procedure: SECOND STAGE LEFT BASCILIC VEIN TRANSPOSITION;  Surgeon: Waynetta Sandy, MD;  Location: [place];  Service: Vascular;  Laterality: Left;  . Benign breast cysts    . CHOLECYSTECTOMY    . COLONOSCOPY  10/01/2006   SLF:Pan colonic diverticulosis and moderate internal hemorrhoids/ Otherwise no polyps, masses, inflammatory changes or AVMs/  . COLONOSCOPY  2011   SLF: pancolonic diverticulosis, large internal hemorrhoids  . COLONOSCOPY N/A 01/26/2016   Procedure: COLONOSCOPY;  Surgeon: [name], MD;  [facility];  Service: Endoscopy;  Laterality: N/A;  830   . COLONOSCOPY WITH PROPOFOL N/A 02/10/2020   Procedure: COLONOSCOPY WITH PROPOFOL;  Surgeon: Daneil Dolin, MD;  [facility];  Service: Endoscopy;  Laterality: N/A;  10:45am  . ESOPHAGOGASTRODUODENOSCOPY  11/19/2006   SLF: Large hiatal hernia without evidence of Cameron ulcers/. Distal esophageal  stricture, which allowed the gastroscope to pass without resistance.  A 16 mm Savary later passed with mild resistance/ Normal stomach.sb bx negative  . ESOPHAGOGASTRODUODENOSCOPY  10/01/2006   TIR:WERXV hiatal hernia.  Distal esophagus without evidence of   erythema, ulceration or Barrett's esophagus  . ESOPHAGOGASTRODUODENOSCOPY  2011   SLF: large hh, distal esophageal web narrowing to 11mm s/p dilation to 46mm  . ESOPHAGOGASTRODUODENOSCOPY N/A 08/06/2013   SLF: 1. Stricture at the gastroesophageal junction 2. large hiatal hernia. 3. Mild erosive  gastritis.  Marland Kitchen ESOPHAGOGASTRODUODENOSCOPY (EGD) WITH PROPOFOL N/A 07/19/2019   rourk: Mild erosive reflux esophagitis.  Mild Schatzki ring status post dilation.  Large hiatal hernia with at least one half of the stomach above the diaphragm.  Gastric mucosa erythematous.  Marland Kitchen GIVENS CAPSULE STUDY N/A 08/06/2013   INCOMPLETE-SMALL BOWLE ULCERS  . IR FLUORO GUIDE CV LINE LEFT  07/29/2019  . IR US GUIDE VASC ACCESS LEFT  07/29/2019  . KNEE SURGERY Right   . PARTIAL HYSTERECTOMY  1978  . small bowel capsule  2008   negative  . SPINE SURGERY    . TONSILLECTOMY AND ADENOIDECTOMY    . Two back surgeries/fusion    . UMBILICAL HERNIA REPAIR  2010    Family History  Problem Relation Age of Onset  . Colon cancer Brother        diagnosed age 35. Living.   Marland Kitchen Ulcers Sister   . Diabetes Sister   . Heart attack Sister   . Kidney failure Sister   . Stroke Sister   . Ulcers Mother   . Diabetes Mother   . Heart attack Mother   . Stroke Mother   . Asthma Mother   . Heart disease Mother   . Cervical cancer Mother   . Heart attack Brother   . Heart disease Brother   . Asthma Sister   . Diabetes Brother   . Stroke Maternal Grandmother   . Heart attack Maternal Grandmother   . Heart attack Other   . Early death Father        MVA in his 40s    Social History   Socioeconomic History  . Marital status: Widowed    Spouse name: Daddona  . Number of children: 4  . Years of education: Not on file  . Highest education level: 10th grade  Occupational History  . 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 Smoking status: Former Smoker    Packs/day: 1.00    Years: 1.00    Pack years: 1.00    Types: Cigarettes    Start date: 02/19/1961    Quit date: 08/05/1961    Years since quitting: 58.7  . Smokeless tobacco: Never Used  . Tobacco comment: Quit smoking x 50 years  Vaping Use  . Vaping Use: Never used  Substance and Sexual Activity  . Alcohol use: No  . Drug use: No   . Sexual activity: Not Currently  Other Topics Concern  . Not on file  Social History Narrative   HAS 4 SON-GRAND Dawson.   Lives in home with Cass - married 13 Y   Cook, sew, quilt, crochet   Social Determinants of Health   Financial Resource Strain:   . Difficulty of Paying Living Expenses: Not on file  Food Insecurity:   . Worried About Charity fundraiser in the Last Year: Not on file  . Ran Out of Food in  the Last Year: Not on file  Transportation Needs:   . Lack of Transportation (Medical): Not on file  . Lack of Transportation (Non-Medical): Not on file  Physical Activity:   . Days of Exercise per Week: Not on file  . Minutes of Exercise per Session: Not on file  Stress:   . Feeling of Stress : Not on file  Social Connections:   . Frequency of Communication with Friends and Family: Not on file  . Frequency of Social Gatherings with Friends and Family: Not on file  . Attends Religious Services: Not on file  . Active Member of Clubs or Organizations: Not on file  . Attends Archivist Meetings: Not on file  . Marital Status: Not on file  Intimate Partner Violence:   . Fear of Current or Ex-Partner: Not on file  . Emotionally Abused: Not on file  . Physically Abused: Not on file  . Sexually Abused: Not on file     Physical Exam   Vitals:   04/07/20 2115 04/07/20 2200  BP: (!) 121/51 (!) 98/47  Pulse: 83 79  Resp: 20 16  Temp:    SpO2: 96% 96%    CONSTITUTIONAL: Chronically ill-appearing, NAD NEURO:  Alert and oriented x 3, no focal deficits EYES:  eyes equal and reactive ENT/NECK:  no LAD, no JVD CARDIO: Regular rate, well-perfused, normal S1 and S2 PULM:  CTAB no wheezing or rhonchi GI/GU:  normal bowel sounds, non-distended, non-tender MSK/SPINE: Tenderness and bruising to the right elbow, mild midline cervical tenderness palpation SKIN:  no rash, atraumatic PSYCH:  Appropriate speech and behavior  *Additional and/or  pertinent findings included in MDM below  Diagnostic and Interventional Summary    EKG Interpretation  Date/Time:  Friday April 07 2020 18:06:03 EDT Ventricular Rate:  84 PR Interval:    QRS Duration: 114 QT Interval:  400 QTC Calculation: 473 R Axis:   6 Text Interpretation: Sinus rhythm Incomplete left bundle branch block Minimal ST elevation, anterior leads Confirmed by Gerlene Fee [phone]) on 04/07/2020 6:13:48 PM      Labs Reviewed  CBC - Abnormal; Notable for the following components:      Result Value   RBC 3.80 (*)    Hemoglobin 11.3 (*)    All other components within normal limits  COMPREHENSIVE METABOLIC PANEL - Abnormal; Notable for the following components:   Chloride 94 (*)    Glucose, Bld 110 (*)    Creatinine, Ser 3.16 (*)    Albumin 3.3 (*)    GFR calc non Af Amer 14 (*)    GFR calc Af Amer 16 (*)    All other components within normal limits  TROPONIN I (HIGH SENSITIVITY)  TROPONIN I (HIGH SENSITIVITY)    CT HEAD WO CONTRAST  Final Result    CT CERVICAL SPINE WO CONTRAST  Final Result    DG Elbow 2 Views Right  Final Result    DG Chest Port 1 View  Final Result      Medications  sodium chloride 0.9 % bolus 250 mL (has no administration in time range)     Procedures  /  Critical Care Procedures  ED Course and Medical Decision Making  I have reviewed the triage vital signs, the nursing notes, and pertinent available records from the EMR.  Listed above are laboratory and imaging tests that I personally ordered, reviewed, and interpreted and then considered in my medical decision making (see below for details).  Presenting as a  level 2 trauma with fall on blood thinners.  Primary survey is reassuring, patient is mildly hypotensive, suspect related to recent hemodialysis.  Will provide small fluid bolus, imaging is pending.     Work-up is reassuring troponin negative x2.  Difficult IV access but patient was able to drink water and her blood  pressure improved.  Sleeping peacefully during her ED visit.  Normal vital signs, no arrhythmia, EKG reassuring, labs reassuring, appropriate for discharge.  Barth Kirks. [place]onia Small, [place] [email or web address]  Final Clinical Impressions(s) / ED Diagnoses     ICD-10-CM   1. Syncope, unspecified syncope type  R55     ED Discharge Orders    None       Discharge Instructions Discussed with and Provided to Patient:     Discharge Instructions     You were evaluated in the Emergency Department and after careful evaluation, we did not find any emergent condition requiring admission or further testing in the hospital.  Your exam/testing today was overall reassuring.  We suspect that your fall was related to becoming lightheaded after dialysis.  Please return to the Emergency Department if you experience any worsening of your condition.  Thank you for allowing [place] to be a part of your care.        Maudie Flakes, MD 04/07/20 2252

## 2020-04-07 NOTE — ED Triage Notes (Signed)
Pt from dialysis via EMS. Completed entire tx, was sitting on bench outside waiting on her ride when she felt dizzy and had an unwitnessed syncopal episode and fell onto the cement. Pt on eliquis, hypotensive 90/50. C/o L knee pain and hematoma on R forearm. A/O x 4.

## 2020-04-24 ENCOUNTER — Other Ambulatory Visit: Payer: Self-pay

## 2020-04-24 ENCOUNTER — Emergency Department (HOSPITAL_COMMUNITY): Payer: Medicare Other

## 2020-04-24 ENCOUNTER — Encounter (HOSPITAL_COMMUNITY): Payer: Self-pay | Admitting: Internal Medicine

## 2020-04-24 ENCOUNTER — Inpatient Hospital Stay (HOSPITAL_COMMUNITY)
Admission: EM | Admit: 2020-04-24 | Discharge: 2020-04-28 | DRG: 553 | Disposition: A | Discharge status: Skilled Nursing Facility | Payer: Medicare Other | Attending: Internal Medicine | Admitting: Internal Medicine

## 2020-04-24 DIAGNOSIS — Z87891 Personal history of nicotine dependence: Secondary | ICD-10-CM

## 2020-04-24 DIAGNOSIS — K219 Gastro-esophageal reflux disease without esophagitis: Secondary | ICD-10-CM | POA: Diagnosis present

## 2020-04-24 DIAGNOSIS — Z79899 Other long term (current) drug therapy: Secondary | ICD-10-CM

## 2020-04-24 DIAGNOSIS — Z7901 Long term (current) use of anticoagulants: Secondary | ICD-10-CM

## 2020-04-24 DIAGNOSIS — J449 Chronic obstructive pulmonary disease, unspecified: Secondary | ICD-10-CM | POA: Diagnosis present

## 2020-04-24 DIAGNOSIS — J9611 Chronic respiratory failure with hypoxia: Secondary | ICD-10-CM | POA: Diagnosis present

## 2020-04-24 DIAGNOSIS — E785 Hyperlipidemia, unspecified: Secondary | ICD-10-CM | POA: Diagnosis not present

## 2020-04-24 DIAGNOSIS — G4733 Obstructive sleep apnea (adult) (pediatric): Secondary | ICD-10-CM | POA: Diagnosis present

## 2020-04-24 DIAGNOSIS — D631 Anemia in chronic kidney disease: Secondary | ICD-10-CM | POA: Diagnosis present

## 2020-04-24 DIAGNOSIS — W1830XA Fall on same level, unspecified, initial encounter: Secondary | ICD-10-CM | POA: Diagnosis present

## 2020-04-24 DIAGNOSIS — E669 Obesity, unspecified: Secondary | ICD-10-CM | POA: Diagnosis not present

## 2020-04-24 DIAGNOSIS — I447 Left bundle-branch block, unspecified: Secondary | ICD-10-CM | POA: Diagnosis present

## 2020-04-24 DIAGNOSIS — W19XXXA Unspecified fall, initial encounter: Secondary | ICD-10-CM

## 2020-04-24 DIAGNOSIS — M25569 Pain in unspecified knee: Secondary | ICD-10-CM

## 2020-04-24 DIAGNOSIS — K449 Diaphragmatic hernia without obstruction or gangrene: Secondary | ICD-10-CM | POA: Diagnosis present

## 2020-04-24 DIAGNOSIS — R509 Fever, unspecified: Secondary | ICD-10-CM

## 2020-04-24 DIAGNOSIS — E66812 Obesity, class 2: Secondary | ICD-10-CM | POA: Diagnosis present

## 2020-04-24 DIAGNOSIS — K5730 Diverticulosis of large intestine without perforation or abscess without bleeding: Secondary | ICD-10-CM | POA: Diagnosis present

## 2020-04-24 DIAGNOSIS — I10 Essential (primary) hypertension: Secondary | ICD-10-CM | POA: Diagnosis present

## 2020-04-24 DIAGNOSIS — N186 End stage renal disease: Secondary | ICD-10-CM

## 2020-04-24 DIAGNOSIS — E875 Hyperkalemia: Secondary | ICD-10-CM

## 2020-04-24 DIAGNOSIS — E8889 Other specified metabolic disorders: Secondary | ICD-10-CM | POA: Diagnosis present

## 2020-04-24 DIAGNOSIS — S0990XA Unspecified injury of head, initial encounter: Secondary | ICD-10-CM | POA: Diagnosis present

## 2020-04-24 DIAGNOSIS — M12862 Other specific arthropathies, not elsewhere classified, left knee: Principal | ICD-10-CM | POA: Diagnosis present

## 2020-04-24 DIAGNOSIS — M109 Gout, unspecified: Secondary | ICD-10-CM | POA: Diagnosis present

## 2020-04-24 DIAGNOSIS — S51019A Laceration without foreign body of unspecified elbow, initial encounter: Secondary | ICD-10-CM

## 2020-04-24 DIAGNOSIS — R000 Tachycardia, unspecified: Secondary | ICD-10-CM | POA: Diagnosis present

## 2020-04-24 DIAGNOSIS — Z6839 Body mass index (BMI) 39.0-39.9, adult: Secondary | ICD-10-CM

## 2020-04-24 DIAGNOSIS — Z20822 Contact with and (suspected) exposure to covid-19: Secondary | ICD-10-CM | POA: Diagnosis present

## 2020-04-24 DIAGNOSIS — E1121 Type 2 diabetes mellitus with diabetic nephropathy: Secondary | ICD-10-CM | POA: Diagnosis not present

## 2020-04-24 DIAGNOSIS — E1122 Type 2 diabetes mellitus with diabetic chronic kidney disease: Secondary | ICD-10-CM | POA: Diagnosis present

## 2020-04-24 DIAGNOSIS — S8002XA Contusion of left knee, initial encounter: Secondary | ICD-10-CM

## 2020-04-24 DIAGNOSIS — Z992 Dependence on renal dialysis: Secondary | ICD-10-CM

## 2020-04-24 DIAGNOSIS — I120 Hypertensive chronic kidney disease with stage 5 chronic kidney disease or end stage renal disease: Secondary | ICD-10-CM | POA: Diagnosis present

## 2020-04-24 DIAGNOSIS — I480 Paroxysmal atrial fibrillation: Secondary | ICD-10-CM | POA: Diagnosis present

## 2020-04-24 DIAGNOSIS — I953 Hypotension of hemodialysis: Secondary | ICD-10-CM | POA: Diagnosis present

## 2020-04-24 DIAGNOSIS — R296 Repeated falls: Secondary | ICD-10-CM

## 2020-04-24 DIAGNOSIS — E1129 Type 2 diabetes mellitus with other diabetic kidney complication: Secondary | ICD-10-CM | POA: Diagnosis present

## 2020-04-24 HISTORY — DX: End stage renal disease: N18.6

## 2020-04-24 HISTORY — DX: Obstructive sleep apnea (adult) (pediatric): G47.33

## 2020-04-24 HISTORY — DX: Dependence on renal dialysis: Z99.2

## 2020-04-24 HISTORY — DX: Hyperlipidemia, unspecified: E78.5

## 2020-04-24 HISTORY — DX: Obesity, unspecified: E66.9

## 2020-04-24 HISTORY — DX: Type 2 diabetes mellitus with other diabetic kidney complication: E11.29

## 2020-04-24 HISTORY — DX: Paroxysmal atrial fibrillation: I48.0

## 2020-04-24 HISTORY — DX: Obesity, class 2: E66.812

## 2020-04-24 LAB — COMPREHENSIVE METABOLIC PANEL
ALT: 9 U/L (ref 0–44)
AST: 14 U/L — ABNORMAL LOW (ref 15–41)
Albumin: 2.8 g/dL — ABNORMAL LOW (ref 3.5–5.0)
Alkaline Phosphatase: 74 U/L (ref 38–126)
Anion gap: 15 (ref 5–15)
BUN: 34 mg/dL — ABNORMAL HIGH (ref 8–23)
CO2: 25 mmol/L (ref 22–32)
Calcium: 8.5 mg/dL — ABNORMAL LOW (ref 8.9–10.3)
Chloride: 99 mmol/L (ref 98–111)
Creatinine, Ser: 7.54 mg/dL — ABNORMAL HIGH (ref 0.44–1.00)
GFR calc Af Amer: 6 mL/min — ABNORMAL LOW (ref >60)
GFR calc non Af Amer: 5 mL/min — ABNORMAL LOW (ref >60)
Glucose, Bld: 134 mg/dL — ABNORMAL HIGH (ref 70–99)
Potassium: 6.1 mmol/L — ABNORMAL HIGH (ref 3.5–5.1)
Sodium: 139 mmol/L (ref 135–145)
Total Bilirubin: 0.8 mg/dL (ref 0.3–1.2)
Total Protein: 6.4 g/dL — ABNORMAL LOW (ref 6.5–8.1)

## 2020-04-24 LAB — URINALYSIS, ROUTINE W REFLEX MICROSCOPIC
Bilirubin Urine: NEGATIVE
Glucose, UA: NEGATIVE mg/dL
Hgb urine dipstick: NEGATIVE
Ketones, ur: NEGATIVE mg/dL
Leukocytes,Ua: NEGATIVE
Nitrite: NEGATIVE
Protein, ur: 100 mg/dL — AB
Specific Gravity, Urine: 1.013 (ref 1.005–1.030)
pH: 7 (ref 5.0–8.0)

## 2020-04-24 LAB — CBC WITH DIFFERENTIAL/PLATELET
Abs Immature Granulocytes: 0.07 10*3/uL (ref 0.00–0.07)
Basophils Absolute: 0.1 10*3/uL (ref 0.0–0.1)
Basophils Relative: 1 %
Eosinophils Absolute: 0 10*3/uL (ref 0.0–0.5)
Eosinophils Relative: 0 %
HCT: 32.9 % — ABNORMAL LOW (ref 36.0–46.0)
Hemoglobin: 10.1 g/dL — ABNORMAL LOW (ref 12.0–15.0)
Immature Granulocytes: 1 %
Lymphocytes Relative: 7 %
Lymphs Abs: 0.6 10*3/uL — ABNORMAL LOW (ref 0.7–4.0)
MCH: 30 pg (ref 26.0–34.0)
MCHC: 30.7 g/dL (ref 30.0–36.0)
MCV: 97.6 fL (ref 80.0–100.0)
Monocytes Absolute: 1.1 10*3/uL — ABNORMAL HIGH (ref 0.1–1.0)
Monocytes Relative: 12 %
Neutro Abs: 7.5 10*3/uL (ref 1.7–7.7)
Neutrophils Relative %: 79 %
Platelets: 302 10*3/uL (ref 150–400)
RBC: 3.37 MIL/uL — ABNORMAL LOW (ref 3.87–5.11)
RDW: 14.8 % (ref 11.5–15.5)
WBC: 9.3 10*3/uL (ref 4.0–10.5)
nRBC: 0 % (ref 0.0–0.2)

## 2020-04-24 LAB — APTT: aPTT: 40 seconds — ABNORMAL HIGH (ref 24–36)

## 2020-04-24 LAB — GLUCOSE, CAPILLARY: Glucose-Capillary: 83 mg/dL (ref 70–99)

## 2020-04-24 LAB — PROCALCITONIN: Procalcitonin: 0.48 ng/mL

## 2020-04-24 LAB — LACTIC ACID, PLASMA: Lactic Acid, Venous: 1.5 mmol/L (ref 0.5–1.9)

## 2020-04-24 LAB — PROTIME-INR
INR: 1.5 — ABNORMAL HIGH (ref 0.8–1.2)
Prothrombin Time: 17.4 seconds — ABNORMAL HIGH (ref 11.4–15.2)

## 2020-04-24 LAB — HEMOGLOBIN A1C
Hgb A1c MFr Bld: 6 % — ABNORMAL HIGH (ref 4.8–5.6)
Mean Plasma Glucose: 125.5 mg/dL

## 2020-04-24 LAB — SARS CORONAVIRUS 2 BY RT PCR (HOSPITAL ORDER, PERFORMED IN ~~LOC~~ HOSPITAL LAB): SARS Coronavirus 2: NEGATIVE

## 2020-04-24 MED ORDER — ZOLPIDEM TARTRATE 5 MG PO TABS: 5.0000 mg | ORAL_TABLET | Freq: Every evening | ORAL | Status: DC | PRN

## 2020-04-24 MED ORDER — TRAMADOL HCL 50 MG PO TABS
50.0000 mg | ORAL_TABLET | Freq: Three times a day (TID) | ORAL | Status: DC | PRN
  Administered 2020-04-24 – 2020-04-26 (×4): 50 mg via ORAL
  Filled 2020-04-24 (×4): qty 1

## 2020-04-24 MED ORDER — PANTOPRAZOLE SODIUM 40 MG PO TBEC
40.0000 mg | DELAYED_RELEASE_TABLET | Freq: Every day | ORAL | Status: DC
  Administered 2020-04-25 – 2020-04-28 (×4): 40 mg via ORAL
  Filled 2020-04-24 (×4): qty 1

## 2020-04-24 MED ORDER — CAMPHOR-MENTHOL 0.5-0.5 % EX LOTN
1.0000 "application " | TOPICAL_LOTION | Freq: Three times a day (TID) | CUTANEOUS | Status: DC | PRN
  Filled 2020-04-24: qty 222

## 2020-04-24 MED ORDER — CALCIUM CARBONATE ANTACID 1250 MG/5ML PO SUSP
500.0000 mg | Freq: Four times a day (QID) | ORAL | Status: DC | PRN
  Filled 2020-04-24: qty 5

## 2020-04-24 MED ORDER — HEPARIN SODIUM (PORCINE) 5000 UNIT/ML IJ SOLN: 5000.0000 [IU] | Freq: Three times a day (TID) | INTRAMUSCULAR | Status: DC

## 2020-04-24 MED ORDER — FERRIC CITRATE 1 GM 210 MG(FE) PO TABS
420.0000 mg | ORAL_TABLET | Freq: Three times a day (TID) | ORAL | Status: DC
  Administered 2020-04-25 – 2020-04-26 (×2): 420 mg via ORAL
  Filled 2020-04-24 (×13): qty 2

## 2020-04-24 MED ORDER — HEPARIN SODIUM (PORCINE) 1000 UNIT/ML IJ SOLN
INTRAMUSCULAR | Status: AC
  Administered 2020-04-25: 3800 [IU]
  Filled 2020-04-24: qty 4

## 2020-04-24 MED ORDER — ASPIRIN EC 81 MG PO TBEC
81.0000 mg | DELAYED_RELEASE_TABLET | Freq: Every day | ORAL | Status: DC
  Administered 2020-04-25 – 2020-04-28 (×4): 81 mg via ORAL
  Filled 2020-04-24 (×4): qty 1

## 2020-04-24 MED ORDER — ONDANSETRON HCL 4 MG PO TABS: 4.0000 mg | ORAL_TABLET | Freq: Four times a day (QID) | ORAL | Status: DC | PRN

## 2020-04-24 MED ORDER — MIDODRINE HCL 5 MG PO TABS: 2.5000 mg | ORAL_TABLET | ORAL | Status: DC

## 2020-04-24 MED ORDER — POLYVINYL ALCOHOL 1.4 % OP SOLN
1.0000 [drp] | Freq: Every day | OPHTHALMIC | Status: DC | PRN
  Filled 2020-04-24: qty 15

## 2020-04-24 MED ORDER — LACTATED RINGERS IV SOLN: INTRAVENOUS | Status: DC

## 2020-04-24 MED ORDER — GABAPENTIN 100 MG PO CAPS
100.0000 mg | ORAL_CAPSULE | Freq: Every day | ORAL | Status: DC
  Administered 2020-04-24 – 2020-04-27 (×4): 100 mg via ORAL
  Filled 2020-04-24 (×4): qty 1

## 2020-04-24 MED ORDER — ONDANSETRON HCL 4 MG/2ML IJ SOLN
4.0000 mg | Freq: Four times a day (QID) | INTRAMUSCULAR | Status: DC | PRN
  Administered 2020-04-25: 4 mg via INTRAVENOUS
  Filled 2020-04-24 (×2): qty 2

## 2020-04-24 MED ORDER — INSULIN ASPART 100 UNIT/ML ~~LOC~~ SOLN
0.0000 [IU] | Freq: Three times a day (TID) | SUBCUTANEOUS | Status: DC
  Administered 2020-04-26: 1 [IU] via SUBCUTANEOUS

## 2020-04-24 MED ORDER — FAMOTIDINE 20 MG PO TABS
40.0000 mg | ORAL_TABLET | Freq: Every day | ORAL | Status: DC
  Administered 2020-04-24: 40 mg via ORAL
  Filled 2020-04-24: qty 2

## 2020-04-24 MED ORDER — UMECLIDINIUM BROMIDE 62.5 MCG/INH IN AEPB
1.0000 | INHALATION_SPRAY | Freq: Every day | RESPIRATORY_TRACT | Status: DC | PRN
  Filled 2020-04-24: qty 7

## 2020-04-24 MED ORDER — APIXABAN 2.5 MG PO TABS
2.5000 mg | ORAL_TABLET | Freq: Two times a day (BID) | ORAL | Status: DC
  Administered 2020-04-24 – 2020-04-25 (×2): 2.5 mg via ORAL
  Filled 2020-04-24 (×3): qty 1

## 2020-04-24 MED ORDER — ALBUTEROL SULFATE HFA 108 (90 BASE) MCG/ACT IN AERS
2.0000 | INHALATION_SPRAY | Freq: Four times a day (QID) | RESPIRATORY_TRACT | Status: DC | PRN
  Filled 2020-04-24: qty 6.7

## 2020-04-24 MED ORDER — ACETAMINOPHEN 500 MG PO TABS: 1000.0000 mg | ORAL_TABLET | Freq: Once | ORAL | Status: DC

## 2020-04-24 MED ORDER — CHLORHEXIDINE GLUCONATE CLOTH 2 % EX PADS
6.0000 | MEDICATED_PAD | Freq: Every day | CUTANEOUS | Status: DC
  Administered 2020-04-25 – 2020-04-28 (×4): 6 via TOPICAL

## 2020-04-24 MED ORDER — SODIUM CHLORIDE 0.9% FLUSH
3.0000 mL | Freq: Two times a day (BID) | INTRAVENOUS | Status: DC
  Administered 2020-04-24 – 2020-04-27 (×6): 3 mL via INTRAVENOUS

## 2020-04-24 MED ORDER — INSULIN ASPART 100 UNIT/ML IV SOLN
5.0000 [IU] | Freq: Once | INTRAVENOUS | Status: AC
  Administered 2020-04-24: 5 [IU] via INTRAVENOUS

## 2020-04-24 MED ORDER — ALLOPURINOL 100 MG PO TABS
100.0000 mg | ORAL_TABLET | Freq: Two times a day (BID) | ORAL | Status: DC
  Administered 2020-04-24 – 2020-04-28 (×8): 100 mg via ORAL
  Filled 2020-04-24 (×9): qty 1

## 2020-04-24 MED ORDER — FENTANYL CITRATE (PF) 100 MCG/2ML IJ SOLN
50.0000 ug | INTRAMUSCULAR | Status: DC | PRN
  Administered 2020-04-24: 50 ug via INTRAVENOUS
  Filled 2020-04-24: qty 2

## 2020-04-24 MED ORDER — FERRIC CITRATE 1 GM 210 MG(FE) PO TABS
210.0000 mg | ORAL_TABLET | Freq: Two times a day (BID) | ORAL | Status: DC
  Administered 2020-04-24: 210 mg via ORAL
  Filled 2020-04-24 (×3): qty 1

## 2020-04-24 MED ORDER — CALCIUM GLUCONATE-NACL 1-0.675 GM/50ML-% IV SOLN
1.0000 g | Freq: Once | INTRAVENOUS | Status: AC
  Administered 2020-04-24: 1000 mg via INTRAVENOUS
  Filled 2020-04-24: qty 50

## 2020-04-24 MED ORDER — CALCITRIOL 0.5 MCG PO CAPS
1.7500 ug | ORAL_CAPSULE | ORAL | Status: DC
  Administered 2020-04-26 – 2020-04-28 (×2): 1.75 ug via ORAL
  Filled 2020-04-24: qty 3

## 2020-04-24 MED ORDER — DEXTROSE 50 % IV SOLN
50.0000 mL | Freq: Once | INTRAVENOUS | Status: AC
  Administered 2020-04-24: 50 mL via INTRAVENOUS
  Filled 2020-04-24: qty 50

## 2020-04-24 MED ORDER — MIDODRINE HCL 5 MG PO TABS
2.5000 mg | ORAL_TABLET | ORAL | Status: DC
  Administered 2020-04-26: 2.5 mg via ORAL
  Filled 2020-04-24: qty 1

## 2020-04-24 MED ORDER — FAMOTIDINE 20 MG PO TABS
10.0000 mg | ORAL_TABLET | Freq: Every day | ORAL | Status: DC
  Administered 2020-04-25 – 2020-04-27 (×3): 10 mg via ORAL
  Filled 2020-04-24 (×3): qty 1

## 2020-04-24 MED ORDER — FENTANYL CITRATE (PF) 100 MCG/2ML IJ SOLN: 50.0000 ug | INTRAMUSCULAR | Status: DC | PRN

## 2020-04-24 MED ORDER — HYDROXYZINE HCL 25 MG PO TABS: 25.0000 mg | ORAL_TABLET | Freq: Three times a day (TID) | ORAL | Status: DC | PRN

## 2020-04-24 MED ORDER — DOCUSATE SODIUM 283 MG RE ENEM
1.0000 | ENEMA | RECTAL | Status: DC | PRN
  Filled 2020-04-24: qty 1

## 2020-04-24 MED ORDER — ACETAMINOPHEN 650 MG RE SUPP: 650.0000 mg | Freq: Four times a day (QID) | RECTAL | Status: DC | PRN

## 2020-04-24 MED ORDER — AMIODARONE HCL 200 MG PO TABS
200.0000 mg | ORAL_TABLET | Freq: Every day | ORAL | Status: DC
  Administered 2020-04-24 – 2020-04-28 (×5): 200 mg via ORAL
  Filled 2020-04-24 (×5): qty 1

## 2020-04-24 MED ORDER — ACETAMINOPHEN 325 MG PO TABS
650.0000 mg | ORAL_TABLET | Freq: Four times a day (QID) | ORAL | Status: DC | PRN
  Administered 2020-04-27 (×2): 650 mg via ORAL
  Filled 2020-04-24 (×2): qty 2

## 2020-04-24 MED ORDER — NEPRO/CARBSTEADY PO LIQD: 237.0000 mL | Freq: Three times a day (TID) | ORAL | Status: DC | PRN

## 2020-04-24 MED ORDER — TAMSULOSIN HCL 0.4 MG PO CAPS
0.4000 mg | ORAL_CAPSULE | Freq: Every day | ORAL | Status: DC
  Administered 2020-04-25 – 2020-04-28 (×4): 0.4 mg via ORAL
  Filled 2020-04-24 (×4): qty 1

## 2020-04-24 MED ORDER — SORBITOL 70 % SOLN
30.0000 mL | Status: DC | PRN
  Filled 2020-04-24: qty 30

## 2020-04-24 MED ORDER — ATORVASTATIN CALCIUM 40 MG PO TABS
40.0000 mg | ORAL_TABLET | Freq: Every day | ORAL | Status: DC
  Administered 2020-04-24 – 2020-04-27 (×4): 40 mg via ORAL
  Filled 2020-04-24 (×4): qty 1

## 2020-04-24 NOTE — Procedures (Signed)
Patient was seen on dialysis and the procedure was supervised.  BFR 350  Via TDC BP is  121/58.   Patient appears to be tolerating treatment well  Louis Meckel 04/24/2020

## 2020-04-24 NOTE — Progress Notes (Signed)
Report given to HD nurse, pt transported to HD at this time.

## 2020-04-24 NOTE — Consult Note (Signed)
Reason for Consult: To manage dialysis and dialysis related needs Referring Physician: [name] is an 74 y.o. female with past medical history significant for HTN, DM, obesity, COPD/OSA, hyperlipidemia, Afib on eliquis, gout as well as ESRD-  HD MWF at [facility].  [name] was on her way to HD today when she fell-  Then rendered unable to walk so came to the ER.  In the ER discovered to have a fever in addition to being very weak.  She is admitted for observation.  We are asked to provide her routine HD-  K in ER was 6.1.  She is noted to be compliant with her HD normally    Dialyzes at [facility] MWF- 4 hours  EDW 78 (not to it of late). Takes midodrine- profile 4 HD Bath 2/2.5, Dialyzer 180, Heparin no-  Only in [facility]. Access TDC. Calcitriol 1.75  mircera 75 venofer 100   Last hgb 10.3, calc 8.1, phos 7.9, pth 418   Past Medical History:  Diagnosis Date  . Class 2 obesity with body mass index (BMI) of 35 to 39.9 without comorbidity   . COPD (chronic obstructive pulmonary disease) ([place])   . DM (diabetes mellitus), type 2 with renal complications ([place])   . Dyslipidemia   . ESRD (end stage renal disease) on dialysis ([place])   . Essential hypertension   . OSA (obstructive sleep apnea)   . PAF (paroxysmal atrial fibrillation) ([place])     History reviewed. No pertinent surgical history.  History reviewed. No pertinent family history.  Social History:  reports that she has quit smoking. She has never used smokeless tobacco. She reports that she does not drink alcohol and does not use drugs.  Allergies: No Known Allergies  Medications: I have reviewed the patient's current medications.   Results for orders placed or performed during the hospital encounter of 04/24/20 (from the past 48 hour(s))  SARS Coronavirus 2 by RT PCR (hospital order, performed in [facility] hospital lab) Nasopharyngeal Nasopharyngeal Swab     Status: None   Collection Time: 04/24/20  1:00 PM    Specimen: Nasopharyngeal Swab  Result Value Ref Range   SARS Coronavirus 2 NEGATIVE NEGATIVE    Comment: (NOTE) SARS-CoV-2 target nucleic acids are NOT DETECTED.  The SARS-CoV-2 RNA is generally detectable in upper and lower respiratory specimens during the acute phase of infection. The lowest concentration of SARS-CoV-2 viral copies this assay can detect is 250 copies / mL. A negative result does not preclude SARS-CoV-2 infection and should not be used as the sole basis for treatment or other patient management decisions.  A negative result may occur with improper specimen collection / handling, submission of specimen other than nasopharyngeal swab, presence of viral mutation(s) within the areas targeted by this assay, and inadequate number of viral copies (<250 copies / mL). A negative result must be combined with clinical observations, patient history, and epidemiological information.  Fact Sheet for Patients:   StrictlyIdeas.no  Fact Sheet for Healthcare Providers: BankingDealers.co.za  This test is not yet approved or  cleared by the [place] FDA and has been authorized for detection and/or diagnosis of SARS-CoV-2 by FDA under an Emergency Use Authorization (EUA).  This EUA will remain in effect (meaning this test can be used) for the duration of the COVID-19 declaration under Section 564(b)(1) of the Act, 21 U.S.C. section 360bbb-3(b)(1), unless the authorization is terminated or revoked sooner.  Performed at [facility], [place]  [address], [place], [place] [address]   Lactic acid, plasma     Status: None   Collection Time: 04/24/20  1:12 PM  Result Value Ref Range   Lactic Acid, Venous 1.5 0.5 - 1.9 mmol/L    Comment: Performed at [facility] [address], [place], [place] [address]  Comprehensive metabolic panel     Status: Abnormal   Collection Time: 04/24/20  1:12 PM  Result Value Ref Range   Sodium  139 135 - 145 mmol/L   Potassium 6.1 (H) 3.5 - 5.1 mmol/L   Chloride 99 98 - 111 mmol/L   CO2 25 22 - 32 mmol/L   Glucose, Bld 134 (H) 70 - 99 mg/dL    Comment: Glucose reference range applies only to samples taken after fasting for at least 8 hours.   BUN 34 (H) 8 - 23 mg/dL   Creatinine, Ser 7.54 (H) 0.44 - 1.00 mg/dL   Calcium 8.5 (L) 8.9 - 10.3 mg/dL   Total Protein 6.4 (L) 6.5 - 8.1 g/dL   Albumin 2.8 (L) 3.5 - 5.0 g/dL   AST 14 (L) 15 - 41 U/L   ALT 9 0 - 44 U/L   Alkaline Phosphatase 74 38 - 126 U/L   Total Bilirubin 0.8 0.3 - 1.2 mg/dL   GFR calc non Af Amer 5 (L) >60 mL/min   GFR calc Af Amer 6 (L) >60 mL/min   Anion gap 15 5 - 15    Comment: Performed at [facility] [address] [place], [place] [address]  CBC WITH DIFFERENTIAL     Status: Abnormal   Collection Time: 04/24/20  1:12 PM  Result Value Ref Range   WBC 9.3 4.0 - 10.5 K/uL   RBC 3.37 (L) 3.87 - 5.11 MIL/uL   Hemoglobin 10.1 (L) 12.0 - 15.0 g/dL   HCT 32.9 (L) 36 - 46 %   MCV 97.6 80.0 - 100.0 fL   MCH 30.0 26.0 - 34.0 pg   MCHC 30.7 30.0 - 36.0 g/dL   RDW 14.8 11.5 - 15.5 %   Platelets 302 150 - 400 K/uL   nRBC 0.0 0.0 - 0.2 %   Neutrophils Relative % 79 %   Neutro Abs 7.5 1.7 - 7.7 K/uL   Lymphocytes Relative 7 %   Lymphs Abs 0.6 (L) 0.7 - 4.0 K/uL   Monocytes Relative 12 %   Monocytes Absolute 1.1 (H) 0 - 1 K/uL   Eosinophils Relative 0 %   Eosinophils Absolute 0.0 0 - 0 K/uL   Basophils Relative 1 %   Basophils Absolute 0.1 0 - 0 K/uL   Immature Granulocytes 1 %   Abs Immature Granulocytes 0.07 0.00 - 0.07 K/uL    Comment: Performed at [facility], [place] [address], [place], [place] [address]  Protime-INR     Status: Abnormal   Collection Time: 04/24/20  1:12 PM  Result Value Ref Range   Prothrombin Time 17.4 (H) 11.4 - 15.2 seconds   INR 1.5 (H) 0.8 - 1.2    Comment: (NOTE) INR goal varies based on device and disease states. Performed at [facility], [place] [address] [place], [place] [address]   APTT     Status: Abnormal   Collection Time: 04/24/20  1:12 PM  Result Value Ref Range   aPTT 40 (H) 24 - 36 seconds    Comment:        IF BASELINE aPTT IS  ELEVATED, SUGGEST PATIENT RISK ASSESSMENT BE USED TO DETERMINE APPROPRIATE ANTICOAGULANT THERAPY. Performed at [facility], [place] [address] [place], [place] [address]   Urinalysis, Routine w reflex microscopic Urine, Catheterized     Status: Abnormal   Collection Time: 04/24/20  3:28 PM  Result Value Ref Range   Color, Urine YELLOW YELLOW   APPearance HAZY (A) CLEAR   Specific Gravity, Urine 1.013 1.005 - 1.030   pH 7.0 5.0 - 8.0   Glucose, UA NEGATIVE NEGATIVE mg/dL   Hgb urine dipstick NEGATIVE NEGATIVE   Bilirubin Urine NEGATIVE NEGATIVE   Ketones, ur NEGATIVE NEGATIVE mg/dL   Protein, ur 100 (A) NEGATIVE mg/dL   Nitrite NEGATIVE NEGATIVE   Leukocytes,Ua NEGATIVE NEGATIVE   RBC / HPF 0-5 0 - 5 RBC/hpf   WBC, UA 0-5 0 - 5 WBC/hpf   Bacteria, UA RARE (A) NONE SEEN   Squamous Epithelial / LPF 11-20 0 - 5   Mucus PRESENT    Hyaline Casts, UA PRESENT     Comment: Performed at [facility] [address] [place], [place] [address]  Glucose, capillary     Status: None   Collection Time: 04/24/20  6:39 PM  Result Value Ref Range   Glucose-Capillary 83 70 - 99 mg/dL    Comment: Glucose reference range applies only to samples taken after fasting for at least 8 hours.  Procalcitonin     Status: None   Collection Time: 04/24/20  6:44 PM  Result Value Ref Range   Procalcitonin 0.48 ng/mL    Comment:        Interpretation: PCT (Procalcitonin) <= 0.5 ng/mL: Systemic infection (sepsis) is not likely. Local bacterial infection is possible. (NOTE)       Sepsis PCT Algorithm           Lower Respiratory Tract                                      Infection PCT Algorithm    ----------------------------     ----------------------------         PCT < 0.25 ng/mL                PCT < 0.10  ng/mL          Strongly encourage             Strongly discourage   discontinuation of antibiotics    initiation of antibiotics    ----------------------------     -----------------------------       PCT 0.25 - 0.50 ng/mL            PCT 0.10 - 0.25 ng/mL               OR       >80% decrease in PCT            Discourage initiation of                                            antibiotics      Encourage discontinuation           of antibiotics    ----------------------------     -----------------------------         PCT >= 0.50 ng/mL  PCT 0.26 - 0.50 ng/mL               AND        <80% decrease in PCT             Encourage initiation of                                             antibiotics       Encourage continuation           of antibiotics    ----------------------------     -----------------------------        PCT >= 0.50 ng/mL                  PCT > 0.50 ng/mL               AND         increase in PCT                  Strongly encourage                                      initiation of antibiotics    Strongly encourage escalation           of antibiotics                                     -----------------------------                                           PCT <= 0.25 ng/mL                                                 OR                                        > 80% decrease in PCT                                      Discontinue / Do not initiate                                             antibiotics  Performed at [facility], [address], [place], [place] [address]   Hemoglobin A1c     Status: Abnormal   Collection Time: 04/24/20  6:44 PM  Result Value Ref Range   Hgb A1c MFr Bld 6.0 (H) 4.8 - 5.6 %    Comment: (NOTE) Pre diabetes:          5.7%-6.4%  Diabetes:              >6.4%  Glycemic control for   <7.0% adults with diabetes    Mean Plasma Glucose 125.5 mg/dL    Comment: Performed at [facility], [place] [address],  [place], [place] [address]    CT ABDOMEN PELVIS WO CONTRAST  Result Date: 04/24/2020 CLINICAL DATA:  Abdominal trauma after fall.  Flank and lumbar pain EXAM: CT ABDOMEN AND PELVIS WITHOUT CONTRAST TECHNIQUE: Multidetector CT imaging of the abdomen and pelvis was performed following the standard protocol without IV contrast. COMPARISON:  CT 07/17/2019 FINDINGS: Lower chest: Bibasilar atelectasis, right worse than left. 4 mm right middle lobe pulmonary nodule and 3 mm right lower lobe pulmonary nodule (series 5, images 11 and 27). These findings are both stable from the prior study. Stable mild cardiomegaly. Large hiatal hernia. Hepatobiliary: No focal liver abnormality is identified on noncontrast imaging. Prior cholecystectomy. No biliary dilatation. Pancreas: Grossly unremarkable. Spleen: Normal in size without focal abnormality. Adrenals/Urinary Tract: Unremarkable adrenal glands. Bilateral kidneys are mildly atrophic with chronic mild perinephric stranding, nonspecific. No renal stone or hydronephrosis. Urinary bladder is unremarkable for the degree of distension. Stomach/Bowel: Again noted is a large hiatal hernia with the majority of the stomach located within the chest. No evidence of outlet obstruction. No dilated loops of bowel. Extensive diverticulosis throughout the colon. No focal colonic thickening or pericolonic inflammatory changes. A normal appendix is present within the right lower quadrant (series 3, image 66). Vascular/Lymphatic: Extensive atherosclerosis. No aneurysm. No abdominopelvic lymphadenopathy. Reproductive: Status post hysterectomy. No adnexal masses. Other: No free fluid. No abdominopelvic fluid collection. No pneumoperitoneum. No abdominal wall hernia. Musculoskeletal: Healed fracture of the lateral aspect of the left eighth rib. Prior fusion of L4-5. No acute osseous findings. No soft tissue fluid collection or hematoma. IMPRESSION: 1. No acute findings in the abdomen or pelvis. 2.  Large hiatal hernia. 3. Extensive colonic diverticulosis without evidence of acute diverticulitis. 4. Aortic atherosclerosis. (ICD10-I70.0). Electronically Signed   By: Davina Poke D.O.   On: 04/24/2020 13:43   DG Tibia/Fibula Left  Result Date: 04/24/2020 CLINICAL DATA:  [place].  Left leg pain. EXAM: LEFT TIBIA AND FIBULA - 2 VIEW COMPARISON:  None. FINDINGS: Knee and ankle joint degenerative changes are noted. No acute fracture of the tibia or fibula is identified. Extensive vascular calcifications are noted. IMPRESSION: 1. No acute bony findings. 2. Extensive vascular calcifications. Electronically Signed   By: Marijo Sanes M.D.   On: 04/24/2020 14:12   CT Head Wo Contrast  Addendum Date: 04/24/2020   ADDENDUM REPORT: 04/24/2020 13:35 ADDENDUM: Right mastoid effusion noted. Electronically Signed   By: Rolm Baptise M.D.   On: 04/24/2020 13:35   Result Date: 04/24/2020 CLINICAL DATA:  Fall on blood thinners EXAM: CT HEAD WITHOUT CONTRAST TECHNIQUE: Contiguous axial images were obtained from the base of the skull through the vertex without intravenous contrast. COMPARISON:  04/07/2020 FINDINGS: Brain: There is atrophy and chronic small vessel disease changes. No acute intracranial abnormality. Specifically, no hemorrhage, hydrocephalus, mass lesion, acute infarction, or significant intracranial injury. Vascular: No hyperdense vessel or unexpected calcification. Skull: No acute calvarial abnormality. Sinuses/Orbits: Visualized paranasal sinuses and mastoids clear. Orbital soft tissues unremarkable. Other: None IMPRESSION: Atrophy, chronic microvascular disease. No acute intracranial abnormality. Electronically Signed: By: Rolm Baptise M.D. On: 04/24/2020 13:32   CT Cervical Spine Wo Contrast  Result Date: 04/24/2020 CLINICAL DATA:  Fall, neck trauma EXAM: CT CERVICAL SPINE WITHOUT CONTRAST TECHNIQUE: Multidetector CT imaging of the cervical spine was performed without intravenous contrast.  Multiplanar CT  image reconstructions were also generated. COMPARISON:  04/07/2020 FINDINGS: Alignment: Normal Skull base and vertebrae: No acute fracture. No primary bone lesion or focal pathologic process. Soft tissues and spinal canal: No prevertebral fluid or swelling. No visible canal hematoma. Disc levels: Diffuse degenerative disc disease with disc space narrowing and spurring. Diffuse degenerative facet disease bilaterally. Upper chest: No acute findings Other: Fluid seen within the right mastoid air cells. IMPRESSION: Diffuse degenerative disc and facet disease. No acute bony abnormality. Right mastoid effusion. Electronically Signed   By: Rolm Baptise M.D.   On: 04/24/2020 13:34   DG Pelvis Portable  Result Date: 04/24/2020 CLINICAL DATA:  [place].  Pelvic pain. EXAM: PORTABLE PELVIS 1-2 VIEWS COMPARISON:  None. FINDINGS: Both hips are normally located. No definite hip or pelvic fractures. The pubic symphysis and SI joints are maintained. Extensive vascular calcifications. IMPRESSION: No acute bony findings. Electronically Signed   By: Marijo Sanes M.D.   On: 04/24/2020 14:11   DG Chest Port 1 View  Result Date: 04/24/2020 CLINICAL DATA:  [place]. EXAM: PORTABLE CHEST 1 VIEW COMPARISON:  04/07/2020 FINDINGS: The heart is enlarged but stable. Stable right IJ Port-A-Cath and left IJ dialysis catheter. No infiltrates, edema or effusions. No pneumothorax. The bony thorax is intact. IMPRESSION: No acute cardiopulmonary findings. Electronically Signed   By: Marijo Sanes M.D.   On: 04/24/2020 14:13   DG Femur Min 2 Views Left  Result Date: 04/24/2020 CLINICAL DATA:  [place].  Left femur pain. EXAM: LEFT FEMUR 2 VIEWS COMPARISON:  None. FINDINGS: The hip and knee joints are maintained. Moderate degenerative changes. No acute hip or femur fractures are identified. Extensive vascular calcifications. IMPRESSION: 1. No acute bony findings. 2. Moderate hip and knee joint degenerative changes. Electronically Signed    By: Marijo Sanes M.D.   On: 04/24/2020 14:10    ROS: knee pain Blood pressure (!) 121/58, pulse 90, temperature 99.1 F (37.3 C), temperature source Oral, resp. rate 16, height 4\' 8"  (1.422 m), weight 71.2 kg, SpO2 100 %. General appearance: fatigued and mild distress Resp: clear to auscultation bilaterally Cardio: regular rate and rhythm, S1, S2 normal, no murmur, click, rub or gallop GI: soft, non-tender; bowel sounds normal; no masses,  no organomegaly Extremities: extremities normal, atraumatic, no cyanosis or edema left sided TDC-  no signs of infection-  left arm AVF-  small and not infrequent infiltrations it appears  Assessment/Plan: 74 year old WF with many medical problems in addition to ESRD-  Presents after mechanical fall with pain and temp 1 s/p fall-  Many x-rays all negative-  Conservative management/PT- admit for obs 2. Fever-  Cultures obtained-  U/A and cxr clear, all imaging clear as well-  WBC 9K-   TDC looks good, no evidence on infection 3 ESRD: will do HD tonight on schedule via TDC-  AVF seems small and bruised- unclear how much they have been using  4 Hypertension: regularly gets midodrine 2.5 mg pre treatment 5. Anemia of ESRD: hgb over 10-  No ESA needed at present 6. Metabolic Bone Disease: cont home calcitriol-  auryxia on med list also    Louis Meckel 04/24/2020, 8:55 PM

## 2020-04-24 NOTE — Progress Notes (Signed)
Orthopedic Tech Progress Note Patient Details:  [name] 1946-06-18 [id number] Trauma Level 2 Patient ID: [name], female   DOB: [birth year], 74 y.o.   MRN: [id number]   Petra Kuba 04/24/2020, 1:41 PM

## 2020-04-24 NOTE — Progress Notes (Signed)
   04/24/20 1223  Clinical Encounter Type  Visited With Patient  Visit Type Trauma  Referral From Nurse  Consult/Referral To Chaplain   Pt being evaluated and no family present. Staff will page Chaplain if/when family shows up.   This note was prepared by Chaplain Resident, Dante Gang, MDiv. For questions, please contact by phone at [phone].

## 2020-04-24 NOTE — ED Notes (Signed)
Pt to CT

## 2020-04-24 NOTE — ED Triage Notes (Signed)
Pt from home, was going to dialysis when her knee gave way and pt fell. Cervical and lumbar spine tenderness present, L leg pain 8/10. Pt AOx4 on arrival.

## 2020-04-24 NOTE — ED Provider Notes (Signed)
Pt signed out by Dr. Reather Converse pending call back from [facility] hospitalists for admission and nephrology.  I spoke with Dr. Lorin Mercy ([facility]) who will admit.  Pt d/w Dr. Jonnie Finner (nephrology) who will put her on the list for dialysis.   Isla Pence, MD 04/24/20 1715

## 2020-04-24 NOTE — ED Provider Notes (Signed)
[facility] EMERGENCY DEPARTMENT Provider Note   CSN: [id number] Arrival date & time: 04/24/20  1235     History No chief complaint on file.   [name] is a 74 y.o. female.  Patient with history of end-stage renal disease on dialysis, Monday Wednesday and Friday presents after a fall on route to dialysis.  Patient felt her left knee give way causing her to fall on her left side and hitting her head.  Patient is on anticoagulant.  Patient denies syncope and feels she does felt weak.  Patient denies vomiting, shortness of breath or cough.  Patient had Covid vaccine.  Patient has constant pain worse with movement.  Patient had falls weeks ago.        No past medical history on file.  There are no problems to display for this patient.      OB History   No obstetric history on file.     No family history on file.  Social History   Tobacco Use   Smoking status: Not on file  Substance Use Topics   Alcohol use: Not on file   Drug use: Not on file    Home Medications Prior to Admission medications   Not on File    Allergies    Patient has no allergy information on record.  Review of Systems   Review of Systems  Constitutional: Negative for chills and fever.  HENT: Negative for congestion.   Eyes: Negative for visual disturbance.  Respiratory: Negative for shortness of breath.   Cardiovascular: Negative for chest pain.  Gastrointestinal: Negative for abdominal pain and vomiting.  Genitourinary: Negative for dysuria and flank pain.  Musculoskeletal: Positive for back pain, gait problem, joint swelling and neck pain. Negative for neck stiffness.  Skin: Positive for wound. Negative for rash.  Neurological: Positive for light-headedness. Negative for headaches.    Physical Exam Updated Vital Signs BP (!) 130/41    Pulse 93    Temp (!) 100.8 F (38.2 C) (Oral)    Resp 20    Ht 4\' 8"  (1.422 m)    Wt 71.2 kg    SpO2 99%    BMI 35.20  kg/m   Physical Exam Vitals and nursing note reviewed.  Constitutional:      Appearance: She is well-developed.  HENT:     Head: Normocephalic and atraumatic.     Comments: Dry mm    Nose: No congestion.  Eyes:     General:        Right eye: No discharge.        Left eye: No discharge.     Conjunctiva/sclera: Conjunctivae normal.  Neck:     Trachea: No tracheal deviation.  Cardiovascular:     Rate and Rhythm: Regular rhythm. Tachycardia present.  Pulmonary:     Effort: Pulmonary effort is normal.     Breath sounds: Normal breath sounds.  Abdominal:     General: There is no distension.     Palpations: Abdomen is soft.     Tenderness: There is abdominal tenderness (left flank). There is no guarding.  Musculoskeletal:        General: Swelling and tenderness present. No deformity.     Cervical back: Neck supple. Tenderness present.     Right lower leg: Edema present.     Left lower leg: Edema present.     Comments: Patient has a, left mid femur, left anterior knee, left proximal tibia worse with flexion of the  left knee.  No significant tenderness right lower extremity.  Patient has chronic skin wounds upper extremity worse in the right elbow and right forearm.  No left foot or ankle tenderness.  Patient has mild paraspinal and midline lower lumbar tenderness and cervical tenderness.   Skin:    General: Skin is warm.     Findings: No rash.  Neurological:     General: No focal deficit present.     Mental Status: She is alert and oriented to person, place, and time.  Psychiatric:     Comments: fatigue     ED Results / Procedures / Treatments   Labs (all labs ordered are listed, but only abnormal results are displayed) Labs Reviewed  COMPREHENSIVE METABOLIC PANEL - Abnormal; Notable for the following components:      Result Value   Potassium 6.1 (*)    Glucose, Bld 134 (*)    BUN 34 (*)    Creatinine, Ser 7.54 (*)    Calcium 8.5 (*)    Total Protein 6.4 (*)     Albumin 2.8 (*)    AST 14 (*)    GFR calc non Af Amer 5 (*)    GFR calc Af Amer 6 (*)    All other components within normal limits  CBC WITH DIFFERENTIAL/PLATELET - Abnormal; Notable for the following components:   RBC 3.37 (*)    Hemoglobin 10.1 (*)    HCT 32.9 (*)    Lymphs Abs 0.6 (*)    Monocytes Absolute 1.1 (*)    All other components within normal limits  PROTIME-INR - Abnormal; Notable for the following components:   Prothrombin Time 17.4 (*)    INR 1.5 (*)    All other components within normal limits  APTT - Abnormal; Notable for the following components:   aPTT 40 (*)    All other components within normal limits  SARS CORONAVIRUS 2 BY RT PCR (HOSPITAL ORDER, [facility])  CULTURE, BLOOD (SINGLE)  URINE CULTURE  LACTIC ACID, PLASMA  LACTIC ACID, PLASMA  URINALYSIS, ROUTINE W REFLEX MICROSCOPIC    EKG EKG Interpretation  Date/Time:  Monday April 24 2020 12:49:20 EDT Ventricular Rate:  95 PR Interval:    QRS Duration: 117 QT Interval:  357 QTC Calculation: 449 R Axis:   13 Text Interpretation: Sinus rhythm Incomplete left bundle branch block Borderline low voltage, extremity leads Minimal ST elevation, anterior leads Confirmed by Elnora Morrison [phone]) on 04/24/2020 1:45:35 PM   Radiology CT ABDOMEN PELVIS WO CONTRAST  Result Date: 04/24/2020 CLINICAL DATA:  Abdominal trauma after fall.  Flank and lumbar pain EXAM: CT ABDOMEN AND PELVIS WITHOUT CONTRAST TECHNIQUE: Multidetector CT imaging of the abdomen and pelvis was performed following the standard protocol without IV contrast. COMPARISON:  CT 07/17/2019 FINDINGS: Lower chest: Bibasilar atelectasis, right worse than left. 4 mm right middle lobe pulmonary nodule and 3 mm right lower lobe pulmonary nodule (series 5, images 11 and 27). These findings are both stable from the prior study. Stable mild cardiomegaly. Large hiatal hernia. Hepatobiliary: No focal liver abnormality is identified  on noncontrast imaging. Prior cholecystectomy. No biliary dilatation. Pancreas: Grossly unremarkable. Spleen: Normal in size without focal abnormality. Adrenals/Urinary Tract: Unremarkable adrenal glands. Bilateral kidneys are mildly atrophic with chronic mild perinephric stranding, nonspecific. No renal stone or hydronephrosis. Urinary bladder is unremarkable for the degree of distension. Stomach/Bowel: Again noted is a large hiatal hernia with the majority of the stomach located within the chest. No  evidence of outlet obstruction. No dilated loops of bowel. Extensive diverticulosis throughout the colon. No focal colonic thickening or pericolonic inflammatory changes. A normal appendix is present within the right lower quadrant (series 3, image 66). Vascular/Lymphatic: Extensive atherosclerosis. No aneurysm. No abdominopelvic lymphadenopathy. Reproductive: Status post hysterectomy. No adnexal masses. Other: No free fluid. No abdominopelvic fluid collection. No pneumoperitoneum. No abdominal wall hernia. Musculoskeletal: Healed fracture of the lateral aspect of the left eighth rib. Prior fusion of L4-5. No acute osseous findings. No soft tissue fluid collection or hematoma. IMPRESSION: 1. No acute findings in the abdomen or pelvis. 2. Large hiatal hernia. 3. Extensive colonic diverticulosis without evidence of acute diverticulitis. 4. Aortic atherosclerosis. (ICD10-I70.0). Electronically Signed   By: Davina Poke D.O.   On: 04/24/2020 13:43   DG Tibia/Fibula Left  Result Date: 04/24/2020 CLINICAL DATA:  [place].  Left leg pain. EXAM: LEFT TIBIA AND FIBULA - 2 VIEW COMPARISON:  None. FINDINGS: Knee and ankle joint degenerative changes are noted. No acute fracture of the tibia or fibula is identified. Extensive vascular calcifications are noted. IMPRESSION: 1. No acute bony findings. 2. Extensive vascular calcifications. Electronically Signed   By: Marijo Sanes M.D.   On: 04/24/2020 14:12   CT Head Wo  Contrast  Addendum Date: 04/24/2020   ADDENDUM REPORT: 04/24/2020 13:35 ADDENDUM: Right mastoid effusion noted. Electronically Signed   By: Rolm Baptise M.D.   On: 04/24/2020 13:35   Result Date: 04/24/2020 CLINICAL DATA:  Fall on blood thinners EXAM: CT HEAD WITHOUT CONTRAST TECHNIQUE: Contiguous axial images were obtained from the base of the skull through the vertex without intravenous contrast. COMPARISON:  04/07/2020 FINDINGS: Brain: There is atrophy and chronic small vessel disease changes. No acute intracranial abnormality. Specifically, no hemorrhage, hydrocephalus, mass lesion, acute infarction, or significant intracranial injury. Vascular: No hyperdense vessel or unexpected calcification. Skull: No acute calvarial abnormality. Sinuses/Orbits: Visualized paranasal sinuses and mastoids clear. Orbital soft tissues unremarkable. Other: None IMPRESSION: Atrophy, chronic microvascular disease. No acute intracranial abnormality. Electronically Signed: By: Rolm Baptise M.D. On: 04/24/2020 13:32   CT Cervical Spine Wo Contrast  Result Date: 04/24/2020 CLINICAL DATA:  Fall, neck trauma EXAM: CT CERVICAL SPINE WITHOUT CONTRAST TECHNIQUE: Multidetector CT imaging of the cervical spine was performed without intravenous contrast. Multiplanar CT image reconstructions were also generated. COMPARISON:  04/07/2020 FINDINGS: Alignment: Normal Skull base and vertebrae: No acute fracture. No primary bone lesion or focal pathologic process. Soft tissues and spinal canal: No prevertebral fluid or swelling. No visible canal hematoma. Disc levels: Diffuse degenerative disc disease with disc space narrowing and spurring. Diffuse degenerative facet disease bilaterally. Upper chest: No acute findings Other: Fluid seen within the right mastoid air cells. IMPRESSION: Diffuse degenerative disc and facet disease. No acute bony abnormality. Right mastoid effusion. Electronically Signed   By: Rolm Baptise M.D.   On: 04/24/2020  13:34   DG Pelvis Portable  Result Date: 04/24/2020 CLINICAL DATA:  [place].  Pelvic pain. EXAM: PORTABLE PELVIS 1-2 VIEWS COMPARISON:  None. FINDINGS: Both hips are normally located. No definite hip or pelvic fractures. The pubic symphysis and SI joints are maintained. Extensive vascular calcifications. IMPRESSION: No acute bony findings. Electronically Signed   By: Marijo Sanes M.D.   On: 04/24/2020 14:11   DG Chest Port 1 View  Result Date: 04/24/2020 CLINICAL DATA:  [place]. EXAM: PORTABLE CHEST 1 VIEW COMPARISON:  04/07/2020 FINDINGS: The heart is enlarged but stable. Stable right IJ Port-A-Cath and left IJ dialysis catheter. No infiltrates, edema  or effusions. No pneumothorax. The bony thorax is intact. IMPRESSION: No acute cardiopulmonary findings. Electronically Signed   By: Marijo Sanes M.D.   On: 04/24/2020 14:13   DG Femur Min 2 Views Left  Result Date: 04/24/2020 CLINICAL DATA:  [place].  Left femur pain. EXAM: LEFT FEMUR 2 VIEWS COMPARISON:  None. FINDINGS: The hip and knee joints are maintained. Moderate degenerative changes. No acute hip or femur fractures are identified. Extensive vascular calcifications. IMPRESSION: 1. No acute bony findings. 2. Moderate hip and knee joint degenerative changes. Electronically Signed   By: Marijo Sanes M.D.   On: 04/24/2020 14:10    Procedures .Critical Care Performed by: Elnora Morrison, MD Authorized by: Elnora Morrison, MD   Critical care provider statement:    Critical care time (minutes):  40   Critical care start time:  04/24/2020 2:20 PM   Critical care end time:  04/24/2020 3:00 PM   Critical care time was exclusive of:  Separately billable procedures and treating other patients and teaching time   Critical care was necessary to treat or prevent imminent or life-threatening deterioration of the following conditions:  Trauma   Critical care was time spent personally by me on the following activities:  Discussions with consultants, evaluation of  patient's response to treatment, examination of patient, ordering and performing treatments and interventions, ordering and review of laboratory studies, ordering and review of radiographic studies, pulse oximetry, re-evaluation of patient's condition and review of old charts   (including critical care time)  Medications Ordered in ED Medications  lactated ringers infusion (has no administration in time range)  acetaminophen (TYLENOL) tablet 1,000 mg (has no administration in time range)  insulin aspart (novoLOG) injection 5 Units (has no administration in time range)  dextrose 50 % solution 50 mL (has no administration in time range)    ED Course  I have reviewed the triage vital signs and the nursing notes.  Pertinent labs & imaging results that were available during my care of the patient were reviewed by me and considered in my medical decision making (see chart for details).    MDM Rules/Calculators/A&P                          Patient presents age, anticoagulant use and fall with head injury. Patient evaluated immediately on arrival and discussed with EMS, patient feels mechanical fall from leg giving out but admits to mild lightheadedness and weakness this morning. On exam patient has left side pain worse on the left thigh and knee, general weakness on exam. CT scan of head, neck, abdomen pelvis showed no acute fracture, no bleeding, chronic findings. X-rays of the left lower extremity no acute fracture. Pain meds given, maintenance fluids started and patient's pain improved on reassessment.  Blood work reviewed showing potassium 6.1, creatinine 7.5, patient has known dialysis needs.  IV insulin and glucose ordered.  Paged nephrology for consult.  Hemoglobin 10.1 chronic.  Patient had fever on arrival which explains her feeling weaker today.  No definitive source at this time, urinalysis pending.  Covid test negative, chest x-ray no acute infiltrate. Patient will need admission  for pain control, general weakness, fever and further evaluation. Patient care be signed out to reassess, follow-up urinalysis and admit.   Final Clinical Impression(s) / ED Diagnoses Final diagnoses:  Fever in adult  Fall, initial encounter  Contusion of left knee, initial encounter  Skin tear of elbow without complication, initial encounter  Hyperkalemia  ESRD needing dialysis [facility])    Rx / DC Orders ED Discharge Orders    None       Elnora Morrison, MD 04/24/20 1527

## 2020-04-24 NOTE — H&P (Signed)
History and Physical    [name]:[id number] DOB: 28-Jan-1946 DOA: 04/24/2020  PCP: [name], MD Consultants:  Anitra Lauth - vascular; nephrology Patient coming from:  Home - lives with son; NOK: [name], [phone]   Chief Complaint: Fall   HPI: [name] is a 74 y.o. female with medical history significant of ESRD on MWF HD; HTN; HLD; DM; obesity; OSA; COPD; and afib on [facility] presenting with a fall.  She reports that she was going to HD today when her left knee gave out, causing her to fall.  Denies syncope, reports that this was a mechanical fall.  She is having ongoing pain in her left knee and leg, limiting her mobility.  She denies fever at home and was generally feeling well this AM prior to the fall.    ED Course:  Going to HD, fell and passed out.  Low-grade temp to 100.8.  Negative trauma exam.  No source of fever, UA pending.  K+ 6.1, trying to do HD today.  Needs obs for weakness, inability to ambulate.  Review of Systems: As per HPI; otherwise review of systems reviewed and negative.   Ambulatory Status:  Ambulates with a walker  COVID Vaccine Status:   Complete  Past Medical History:  Diagnosis Date  . Class 2 obesity with body mass index (BMI) of 35 to 39.9 without comorbidity   . COPD (chronic obstructive pulmonary disease) ([place])   . DM (diabetes mellitus), type 2 with renal complications ([place])   . Dyslipidemia   . ESRD (end stage renal disease) on dialysis ([place])   . Essential hypertension   . OSA (obstructive sleep apnea)   . PAF (paroxysmal atrial fibrillation) ([place])     History reviewed. No pertinent surgical history.  Social History   Socioeconomic History  . Marital status: Widowed    Spouse name: Not on file  . Number of children: Not on file  . Years of education: Not on file  . Highest education level: Not on file  Occupational History  . Occupation: retired  Tobacco Use  . Smoking status: Former  Research scientist (life sciences)  . Smokeless tobacco: Never Used  . Tobacco comment: 1 year in her lifetime  Substance and Sexual Activity  . Alcohol use: Never  . Drug use: Never  . Sexual activity: Not on file  Other Topics Concern  . Not on file  Social History Narrative  . Not on file   Social Determinants of Health   Financial Resource Strain:   . Difficulty of Paying Living Expenses: Not on file  Food Insecurity:   . Worried About Charity fundraiser in the Last Year: Not on file  . Ran Out of Food in the Last Year: Not on file  Transportation Needs:   . Lack of Transportation (Medical): Not on file  . Lack of Transportation (Non-Medical): Not on file  Physical Activity:   . Days of Exercise per Week: Not on file  . Minutes of Exercise per Session: Not on file  Stress:   . Feeling of Stress : Not on file  Social Connections:   . Frequency of Communication with Friends and Family: Not on file  . Frequency of Social Gatherings with Friends and Family: Not on file  . Attends Religious Services: Not on file  . Active Member of Clubs or Organizations: Not on file  . Attends Archivist Meetings: Not on file  . Marital Status: Not on  file  Intimate Partner Violence:   . Fear of Current or Ex-Partner: Not on file  . Emotionally Abused: Not on file  . Physically Abused: Not on file  . Sexually Abused: Not on file    No Known Allergies  History reviewed. No pertinent family history.  Prior to Admission medications   Not on File    Physical Exam: Vitals:   04/24/20 1415 04/24/20 1430 04/24/20 1515 04/24/20 1530  BP: (!) 147/128 (!) 130/41 115/74 107/71  Pulse: 96 93 93 92  Resp: 19 20 16 20   Temp:   98.2 F (36.8 C)   TempSrc:   Oral   SpO2: (!) 88% 99% 99% 94%  Weight:      Height:         . General:  Appears calm but uncomfortable, sedentary . Eyes:  PERRL, EOMI, normal lids, iris . ENT:  grossly normal hearing, lips & tongue, mmm . Neck:  no LAD, masses or  thyromegaly . Cardiovascular:  RRR, no m/r/g. No LE edema.  Marland Kitchen Respiratory:   CTA bilaterally with no wheezes/rales/rhonchi.  Normal respiratory effort. . Abdomen:  soft, NT, ND, NABS . Skin:  no rash or induration seen on limited exam . Musculoskeletal:  grossly normal tone BUE/BLE, L knee is warm but not red, no bony abnormality . Psychiatric:  blunted mood and affect, speech fluent and appropriate, AOx3 . Neurologic:  CN 2-12 grossly intact, moves all extremities in coordinated fashion    Radiological Exams on Admission: CT ABDOMEN PELVIS WO CONTRAST  Result Date: 04/24/2020 CLINICAL DATA:  Abdominal trauma after fall.  Flank and lumbar pain EXAM: CT ABDOMEN AND PELVIS WITHOUT CONTRAST TECHNIQUE: Multidetector CT imaging of the abdomen and pelvis was performed following the standard protocol without IV contrast. COMPARISON:  CT 07/17/2019 FINDINGS: Lower chest: Bibasilar atelectasis, right worse than left. 4 mm right middle lobe pulmonary nodule and 3 mm right lower lobe pulmonary nodule (series 5, images 11 and 27). These findings are both stable from the prior study. Stable mild cardiomegaly. Large hiatal hernia. Hepatobiliary: No focal liver abnormality is identified on noncontrast imaging. Prior cholecystectomy. No biliary dilatation. Pancreas: Grossly unremarkable. Spleen: Normal in size without focal abnormality. Adrenals/Urinary Tract: Unremarkable adrenal glands. Bilateral kidneys are mildly atrophic with chronic mild perinephric stranding, nonspecific. No renal stone or hydronephrosis. Urinary bladder is unremarkable for the degree of distension. Stomach/Bowel: Again noted is a large hiatal hernia with the majority of the stomach located within the chest. No evidence of outlet obstruction. No dilated loops of bowel. Extensive diverticulosis throughout the colon. No focal colonic thickening or pericolonic inflammatory changes. A normal appendix is present within the right lower quadrant  (series 3, image 66). Vascular/Lymphatic: Extensive atherosclerosis. No aneurysm. No abdominopelvic lymphadenopathy. Reproductive: Status post hysterectomy. No adnexal masses. Other: No free fluid. No abdominopelvic fluid collection. No pneumoperitoneum. No abdominal wall hernia. Musculoskeletal: Healed fracture of the lateral aspect of the left eighth rib. Prior fusion of L4-5. No acute osseous findings. No soft tissue fluid collection or hematoma. IMPRESSION: 1. No acute findings in the abdomen or pelvis. 2. Large hiatal hernia. 3. Extensive colonic diverticulosis without evidence of acute diverticulitis. 4. Aortic atherosclerosis. (ICD10-I70.0). Electronically Signed   By: Davina Poke D.O.   On: 04/24/2020 13:43   DG Tibia/Fibula Left  Result Date: 04/24/2020 CLINICAL DATA:  [place].  Left leg pain. EXAM: LEFT TIBIA AND FIBULA - 2 VIEW COMPARISON:  None. FINDINGS: Knee and ankle joint degenerative changes are noted. No  acute fracture of the tibia or fibula is identified. Extensive vascular calcifications are noted. IMPRESSION: 1. No acute bony findings. 2. Extensive vascular calcifications. Electronically Signed   By: Marijo Sanes M.D.   On: 04/24/2020 14:12   CT Head Wo Contrast  Addendum Date: 04/24/2020   ADDENDUM REPORT: 04/24/2020 13:35 ADDENDUM: Right mastoid effusion noted. Electronically Signed   By: Rolm Baptise M.D.   On: 04/24/2020 13:35   Result Date: 04/24/2020 CLINICAL DATA:  Fall on blood thinners EXAM: CT HEAD WITHOUT CONTRAST TECHNIQUE: Contiguous axial images were obtained from the base of the skull through the vertex without intravenous contrast. COMPARISON:  04/07/2020 FINDINGS: Brain: There is atrophy and chronic small vessel disease changes. No acute intracranial abnormality. Specifically, no hemorrhage, hydrocephalus, mass lesion, acute infarction, or significant intracranial injury. Vascular: No hyperdense vessel or unexpected calcification. Skull: No acute calvarial  abnormality. Sinuses/Orbits: Visualized paranasal sinuses and mastoids clear. Orbital soft tissues unremarkable. Other: None IMPRESSION: Atrophy, chronic microvascular disease. No acute intracranial abnormality. Electronically Signed: By: Rolm Baptise M.D. On: 04/24/2020 13:32   CT Cervical Spine Wo Contrast  Result Date: 04/24/2020 CLINICAL DATA:  Fall, neck trauma EXAM: CT CERVICAL SPINE WITHOUT CONTRAST TECHNIQUE: Multidetector CT imaging of the cervical spine was performed without intravenous contrast. Multiplanar CT image reconstructions were also generated. COMPARISON:  04/07/2020 FINDINGS: Alignment: Normal Skull base and vertebrae: No acute fracture. No primary bone lesion or focal pathologic process. Soft tissues and spinal canal: No prevertebral fluid or swelling. No visible canal hematoma. Disc levels: Diffuse degenerative disc disease with disc space narrowing and spurring. Diffuse degenerative facet disease bilaterally. Upper chest: No acute findings Other: Fluid seen within the right mastoid air cells. IMPRESSION: Diffuse degenerative disc and facet disease. No acute bony abnormality. Right mastoid effusion. Electronically Signed   By: Rolm Baptise M.D.   On: 04/24/2020 13:34   DG Pelvis Portable  Result Date: 04/24/2020 CLINICAL DATA:  [place].  Pelvic pain. EXAM: PORTABLE PELVIS 1-2 VIEWS COMPARISON:  None. FINDINGS: Both hips are normally located. No definite hip or pelvic fractures. The pubic symphysis and SI joints are maintained. Extensive vascular calcifications. IMPRESSION: No acute bony findings. Electronically Signed   By: Marijo Sanes M.D.   On: 04/24/2020 14:11   DG Chest Port 1 View  Result Date: 04/24/2020 CLINICAL DATA:  [place]. EXAM: PORTABLE CHEST 1 VIEW COMPARISON:  04/07/2020 FINDINGS: The heart is enlarged but stable. Stable right IJ Port-A-Cath and left IJ dialysis catheter. No infiltrates, edema or effusions. No pneumothorax. The bony thorax is intact. IMPRESSION: No acute  cardiopulmonary findings. Electronically Signed   By: Marijo Sanes M.D.   On: 04/24/2020 14:13   DG Femur Min 2 Views Left  Result Date: 04/24/2020 CLINICAL DATA:  [place].  Left femur pain. EXAM: LEFT FEMUR 2 VIEWS COMPARISON:  None. FINDINGS: The hip and knee joints are maintained. Moderate degenerative changes. No acute hip or femur fractures are identified. Extensive vascular calcifications. IMPRESSION: 1. No acute bony findings. 2. Moderate hip and knee joint degenerative changes. Electronically Signed   By: Marijo Sanes M.D.   On: 04/24/2020 14:10    EKG: Independently reviewed.  NSR with rate 95; incomplete LBBB; mildly peaked T waves; nonspecific ST changes    Labs on Admission: I have personally reviewed the available labs and imaging studies at the time of the admission.  Pertinent labs:   K+ 6.1 Glucose 134 BUN 34/Creatinine 7.54/GFR 5 Albumin 2.8 Lactate 1.5 WBC 9.3 Hgb 10.1 INR 1.5  COVID negative UA: 100 protein, rare bacteria Blood cultures pending   Assessment/Plan Principal Problem:   Fall Active Problems:   ESRD (end stage renal disease) on dialysis ([place])   Essential hypertension   Dyslipidemia   DM (diabetes mellitus), type 2 with renal complications ([facility])   Class 2 obesity with body mass index (BMI) of 35 to 39.9 without comorbidity   OSA (obstructive sleep apnea)   COPD (chronic obstructive pulmonary disease) ([facility])   PAF (paroxysmal atrial fibrillation) ([place])   Fall -Patient with mechanical fall today -Complains of left knee and leg pain, negative xrays -For now, will observe -PT/OT consults -If not improving with rest, pain medication, consider orthopedics consult and/or further imaging  Fever -Patient had a fever to 100.8 upon arrival -Also with tachycardia, tachypnea - so possible sepsis -She does not have current evidence of organ dysfunction -Blood and urine cultures pending -Negative lactate -Will monitor without antibiotics at this  time  ESRD on HD -Patient on chronic MWF HD -Nephrology prn order set utilized -She does not appear to be volume overloaded or otherwise in need of acute HD but does have mild hyperkalemia - will monitor on telemetry until after HD -Nephrology has been notified that patient will need HD  HTN -No longer able to take BP medications due to dialysis-associated hypotension -Continue Midodrine  DM -Will check A1c -She is no longer taking medications for DM -Will cover with very-sensitive scale SSI  HLD -Continue Lipitor  Chronic respiratory failure with COPD/OSA -Continue [place] O2 -Continue Trelegy - although prn may not be as effective so consider transition to daily -Continue albuterol PRN -Continue CPAP  PAF -Rate control with Amiodarone -Continue Eliquis  Obesity Body mass index is 35.2 kg/m. -Weight loss should be encouraged -Outpatient PCP/bariatric medicine f/u encouraged     Note: This patient has been tested and is negative for the novel coronavirus COVID-19.   DVT prophylaxis: Eliquis Code Status:  Full - confirmed with patient Family Communication: None present; I was unable to reach the patient's son by telephone at the time of admission. Disposition Plan:  The patient is from: home  Anticipated d/c is to: be determined  Anticipated d/c date will depend on clinical response to treatment, but possibly as early as tomorrow if she has excellent response to treatment  Patient is currently: acutely ill Consults called: Nephrology; PT/OT Admission status:  It is my clinical opinion that referral for OBSERVATION is reasonable and necessary in this patient based on the above information provided. The aforementioned taken together are felt to place the patient at high risk for further clinical deterioration. However it is anticipated that the patient may be medically stable for discharge from the hospital within 24 to 48 hours.    Karmen Bongo MD [facility]   How to contact the [facility] Attending or Consulting provider [place] or covering provider during after hours [place], for this patient?  1. Check the care team in [facility] and look for a) attending/consulting [facility] provider listed and b) the [facility] team listed 2. Log into [email or web address] and use [place]'s universal password to access. If you do not have the password, please contact the hospital operator. 3. Locate the [facility] provider you are looking for under [facility] and page to a number that you can be directly reached. 4. If you still have difficulty reaching the provider, please page the [facility] (Director on Call) for the Hospitalists listed on amion for assistance.   04/24/2020, 5:45  PM

## 2020-04-25 ENCOUNTER — Ambulatory Visit: Payer: Medicare Other | Admitting: Gastroenterology

## 2020-04-25 ENCOUNTER — Observation Stay (HOSPITAL_COMMUNITY): Payer: Medicare Other

## 2020-04-25 DIAGNOSIS — J449 Chronic obstructive pulmonary disease, unspecified: Secondary | ICD-10-CM | POA: Diagnosis present

## 2020-04-25 DIAGNOSIS — R000 Tachycardia, unspecified: Secondary | ICD-10-CM | POA: Diagnosis present

## 2020-04-25 DIAGNOSIS — E1122 Type 2 diabetes mellitus with diabetic chronic kidney disease: Secondary | ICD-10-CM | POA: Diagnosis present

## 2020-04-25 DIAGNOSIS — I120 Hypertensive chronic kidney disease with stage 5 chronic kidney disease or end stage renal disease: Secondary | ICD-10-CM | POA: Diagnosis present

## 2020-04-25 DIAGNOSIS — E669 Obesity, unspecified: Secondary | ICD-10-CM | POA: Diagnosis not present

## 2020-04-25 DIAGNOSIS — W1830XA Fall on same level, unspecified, initial encounter: Secondary | ICD-10-CM | POA: Diagnosis present

## 2020-04-25 DIAGNOSIS — N186 End stage renal disease: Secondary | ICD-10-CM | POA: Diagnosis present

## 2020-04-25 DIAGNOSIS — E785 Hyperlipidemia, unspecified: Secondary | ICD-10-CM | POA: Diagnosis present

## 2020-04-25 DIAGNOSIS — I953 Hypotension of hemodialysis: Secondary | ICD-10-CM | POA: Diagnosis present

## 2020-04-25 DIAGNOSIS — R296 Repeated falls: Secondary | ICD-10-CM | POA: Diagnosis present

## 2020-04-25 DIAGNOSIS — Z992 Dependence on renal dialysis: Secondary | ICD-10-CM | POA: Diagnosis not present

## 2020-04-25 DIAGNOSIS — K219 Gastro-esophageal reflux disease without esophagitis: Secondary | ICD-10-CM | POA: Diagnosis present

## 2020-04-25 DIAGNOSIS — Z87891 Personal history of nicotine dependence: Secondary | ICD-10-CM | POA: Diagnosis not present

## 2020-04-25 DIAGNOSIS — I480 Paroxysmal atrial fibrillation: Secondary | ICD-10-CM | POA: Diagnosis present

## 2020-04-25 DIAGNOSIS — I447 Left bundle-branch block, unspecified: Secondary | ICD-10-CM | POA: Diagnosis present

## 2020-04-25 DIAGNOSIS — D631 Anemia in chronic kidney disease: Secondary | ICD-10-CM | POA: Diagnosis present

## 2020-04-25 DIAGNOSIS — Z7901 Long term (current) use of anticoagulants: Secondary | ICD-10-CM | POA: Diagnosis not present

## 2020-04-25 DIAGNOSIS — S51019A Laceration without foreign body of unspecified elbow, initial encounter: Secondary | ICD-10-CM | POA: Diagnosis present

## 2020-04-25 DIAGNOSIS — J9611 Chronic respiratory failure with hypoxia: Secondary | ICD-10-CM | POA: Diagnosis present

## 2020-04-25 DIAGNOSIS — Z20822 Contact with and (suspected) exposure to covid-19: Secondary | ICD-10-CM | POA: Diagnosis present

## 2020-04-25 DIAGNOSIS — G4733 Obstructive sleep apnea (adult) (pediatric): Secondary | ICD-10-CM | POA: Diagnosis present

## 2020-04-25 DIAGNOSIS — M109 Gout, unspecified: Secondary | ICD-10-CM | POA: Diagnosis present

## 2020-04-25 DIAGNOSIS — E1121 Type 2 diabetes mellitus with diabetic nephropathy: Secondary | ICD-10-CM | POA: Diagnosis not present

## 2020-04-25 DIAGNOSIS — R509 Fever, unspecified: Secondary | ICD-10-CM | POA: Diagnosis present

## 2020-04-25 DIAGNOSIS — W19XXXA Unspecified fall, initial encounter: Secondary | ICD-10-CM | POA: Diagnosis not present

## 2020-04-25 DIAGNOSIS — E875 Hyperkalemia: Secondary | ICD-10-CM | POA: Diagnosis present

## 2020-04-25 DIAGNOSIS — M12862 Other specific arthropathies, not elsewhere classified, left knee: Secondary | ICD-10-CM | POA: Diagnosis present

## 2020-04-25 LAB — SYNOVIAL CELL COUNT + DIFF, W/ CRYSTALS
Crystals, Fluid: NONE SEEN
Eosinophils-Synovial: 1 % (ref 0–1)
Lymphocytes-Synovial Fld: 3 % (ref 0–20)
Monocyte-Macrophage-Synovial Fluid: 7 % — ABNORMAL LOW (ref 50–90)
Neutrophil, Synovial: 89 % — ABNORMAL HIGH (ref 0–25)
WBC, Synovial: 30750 /mm3 — ABNORMAL HIGH (ref 0–200)

## 2020-04-25 LAB — BASIC METABOLIC PANEL
Anion gap: 12 (ref 5–15)
BUN: 8 mg/dL (ref 8–23)
CO2: 27 mmol/L (ref 22–32)
Calcium: 8.2 mg/dL — ABNORMAL LOW (ref 8.9–10.3)
Chloride: 99 mmol/L (ref 98–111)
Creatinine, Ser: 2.74 mg/dL — ABNORMAL HIGH (ref 0.44–1.00)
GFR calc Af Amer: 19 mL/min — ABNORMAL LOW (ref >60)
GFR calc non Af Amer: 16 mL/min — ABNORMAL LOW (ref >60)
Glucose, Bld: 111 mg/dL — ABNORMAL HIGH (ref 70–99)
Potassium: 3.6 mmol/L (ref 3.5–5.1)
Sodium: 138 mmol/L (ref 135–145)

## 2020-04-25 LAB — URINE CULTURE: Culture: NO GROWTH

## 2020-04-25 LAB — CBC
HCT: 29.3 % — ABNORMAL LOW (ref 36.0–46.0)
Hemoglobin: 9.1 g/dL — ABNORMAL LOW (ref 12.0–15.0)
MCH: 29.4 pg (ref 26.0–34.0)
MCHC: 31.1 g/dL (ref 30.0–36.0)
MCV: 94.8 fL (ref 80.0–100.0)
Platelets: 247 10*3/uL (ref 150–400)
RBC: 3.09 MIL/uL — ABNORMAL LOW (ref 3.87–5.11)
RDW: 14.6 % (ref 11.5–15.5)
WBC: 7.4 10*3/uL (ref 4.0–10.5)
nRBC: 0 % (ref 0.0–0.2)

## 2020-04-25 LAB — GLUCOSE, CAPILLARY
Glucose-Capillary: 109 mg/dL — ABNORMAL HIGH (ref 70–99)
Glucose-Capillary: 117 mg/dL — ABNORMAL HIGH (ref 70–99)
Glucose-Capillary: 109 mg/dL — ABNORMAL HIGH (ref 70–99)
Glucose-Capillary: 118 mg/dL — ABNORMAL HIGH (ref 70–99)
Glucose-Capillary: 153 mg/dL — ABNORMAL HIGH (ref 70–99)

## 2020-04-25 LAB — HEPATITIS B SURFACE ANTIGEN: Hepatitis B Surface Ag: NONREACTIVE

## 2020-04-25 MED ORDER — METHYLPREDNISOLONE ACETATE 40 MG/ML IJ SUSP
40.0000 mg | Freq: Once | INTRAMUSCULAR | Status: DC
  Filled 2020-04-25: qty 1

## 2020-04-25 MED ORDER — BUPIVACAINE HCL (PF) 0.5 % IJ SOLN
10.0000 mL | Freq: Once | INTRAMUSCULAR | Status: DC
  Filled 2020-04-25: qty 10

## 2020-04-25 MED ORDER — UMECLIDINIUM BROMIDE 62.5 MCG/INH IN AEPB
1.0000 | INHALATION_SPRAY | Freq: Every day | RESPIRATORY_TRACT | Status: DC | PRN
  Filled 2020-04-25: qty 7

## 2020-04-25 NOTE — Progress Notes (Signed)
RT went to check on pt to see if she is ready to go on the CPAP. Pt said she will let the nurse know when she is ready. RT will continue to monitor.

## 2020-04-25 NOTE — Progress Notes (Signed)
Pt transported back to room from HD. Resp notified that pt back in room for placement of CPAP.

## 2020-04-25 NOTE — Progress Notes (Signed)
Reviewed results from knee aspirate, 30,000 white cells, no organisms, no crystals.  This is consistent with her degenerative arthritic condition, and inflammatory arthropathy.  We will continue to monitor culture for any concerns of sepsis, however this does not appear septic so far.  Okay to use knee as tolerated, weight-bear as tolerated, movement as tolerated.  Marchia Bond, MD

## 2020-04-25 NOTE — NC FL2 (Signed)
[place] LEVEL OF CARE SCREENING TOOL     IDENTIFICATION  Patient Name: [name] Birthdate: Mar 03, 1946 Sex: female Admission Date (Current Location): 04/24/2020  [facility] and [place] Number:  Herbalist and Address:  [facility], [place] [address] [place],  [address]      Provider Number: [id number]  Attending Physician Name and Address:  [place] Donnamarie Poag, DO  Relative Name and Phone Number:       Current Level of Care: Hospital Recommended Level of Care: [place] Prior Approval Number:    Date Approved/Denied:   PASRR Number: [id number]  Discharge Plan: SNF    Current Diagnoses: Patient Active Problem List   Diagnosis Date Noted  . Recurrent falls 04/25/2020  . Fall 04/24/2020  . ESRD (end stage renal disease) on dialysis ([place])   . Essential hypertension   . Dyslipidemia   . DM (diabetes mellitus), type 2 with renal complications ([place])   . Class 2 obesity with body mass index (BMI) of 35 to 39.9 without comorbidity   . OSA (obstructive sleep apnea)   . COPD (chronic obstructive pulmonary disease) ([place])   . PAF (paroxysmal atrial fibrillation) ([facility])     Orientation RESPIRATION BLADDER Height & Weight     Self, Time, Situation, Place  O2 Continent (hemodialysis) Weight: 177 lb 7.5 oz (80.5 kg) Height:  4\' 8"  (142.2 cm)  BEHAVIORAL SYMPTOMS/MOOD NEUROLOGICAL BOWEL NUTRITION STATUS      Continent Diet  AMBULATORY STATUS COMMUNICATION OF NEEDS Skin   Extensive Assist Verbally Skin abrasions, Other (Comment) (abrasions on arms; generalized ecchymosis)                       Personal Care Assistance Level of Assistance  Bathing, Dressing, Feeding Bathing Assistance: Limited assistance Feeding assistance: Independent Dressing Assistance: Limited assistance     Functional Limitations Info  Hearing, Sight, Speech Sight Info: Adequate Hearing Info: Adequate Speech Info: Adequate     SPECIAL CARE FACTORS FREQUENCY  PT (By licensed PT), OT (By licensed OT)     PT Frequency: five times a week OT Frequency: five times a week            Contractures Contractures Info: Not present    Additional Factors Info  Code Status, Allergies, Insulin Sliding Scale Code Status Info: full code Allergies Info: no known allergies   Insulin Sliding Scale Info: insulin aspart (novoLOG) injection 0-6 Units 3x daily with meals       Current Medications (04/25/2020):  This is the current hospital active medication list Current Facility-Administered Medications  Medication Dose Route Frequency Provider Last Rate Last Admin  . acetaminophen (TYLENOL) tablet 650 mg  650 mg Oral Q6H PRN Karmen Bongo, MD       Or  . acetaminophen (TYLENOL) suppository 650 mg  650 mg Rectal Q6H PRN Karmen Bongo, MD      . albuterol (VENTOLIN HFA) 108 (90 Base) MCG/ACT inhaler 2 puff  2 puff Inhalation Q6H PRN Karmen Bongo, MD      . allopurinol (ZYLOPRIM) tablet 100 mg  100 mg Oral BID Karmen Bongo, MD   100 mg at 04/25/20 0850  . amiodarone (PACERONE) tablet 200 mg  200 mg Oral Daily Karmen Bongo, MD   200 mg at 04/25/20 0850  . aspirin EC tablet 81 mg  81 mg Oral Daily Karmen Bongo, MD   81 mg at 04/25/20 0851  . atorvastatin (LIPITOR)  tablet 40 mg  40 mg Oral QHS Karmen Bongo, MD   40 mg at 04/24/20 2017  . bupivacaine (MARCAINE) 0.5 % injection 10 mL  10 mL Infiltration Once Ventura Bruns, PA-C      . [START ON 04/26/2020] calcitRIOL (ROCALTROL) capsule 1.75 mcg  1.75 mcg Oral Q M,W,F-HD Corliss Parish, MD      . calcium carbonate (dosed in mg elemental calcium) suspension 500 mg of elemental calcium  500 mg of elemental calcium Oral Q6H PRN Karmen Bongo, MD      . camphor-menthol [facility]) lotion 1 application  1 application Topical W4X PRN Karmen Bongo, MD       And  . hydrOXYzine (ATARAX/VISTARIL) tablet 25 mg  25 mg Oral Q8H PRN Karmen Bongo, MD      .  Chlorhexidine Gluconate Cloth 2 % PADS 6 each  6 each Topical Q0600 Corliss Parish, MD   6 each at 04/25/20 [phone]  . docusate sodium (ENEMEEZ) enema 283 mg  1 enema Rectal PRN Karmen Bongo, MD      . famotidine (PEPCID) tablet 10 mg  10 mg Oral QHS Karmen Bongo, MD      . feeding supplement (NEPRO CARB STEADY) liquid 237 mL  237 mL Oral TID PRN Karmen Bongo, MD      . fentaNYL (SUBLIMAZE) injection 50 mcg  50 mcg Intravenous Q2H PRN Karmen Bongo, MD      . ferric citrate (AURYXIA) tablet 420 mg  420 mg Oral TID WC Corliss Parish, MD   420 mg at 04/25/20 1142  . gabapentin (NEURONTIN) capsule 100 mg  100 mg Oral QHS Karmen Bongo, MD   100 mg at 04/24/20 2016  . insulin aspart (novoLOG) injection 0-6 Units  0-6 Units Subcutaneous TID WC Karmen Bongo, MD      . methylPREDNISolone acetate (DEPO-MEDROL) injection 40 mg  40 mg Intra-articular Once Merlene Pulling K, PA-C      . [START ON 04/26/2020] midodrine (PROAMATINE) tablet 2.5 mg  2.5 mg Oral Q M,W,F-HD Corliss Parish, MD      . ondansetron [facility]) tablet 4 mg  4 mg Oral Q6H PRN Karmen Bongo, MD       Or  . ondansetron [facility]) injection 4 mg  4 mg Intravenous Q6H PRN Karmen Bongo, MD   4 mg at 04/25/20 0102  . pantoprazole (PROTONIX) EC tablet 40 mg  40 mg Oral Daily Karmen Bongo, MD   40 mg at 04/25/20 0850  . polyvinyl alcohol (LIQUIFILM TEARS) 1.4 % ophthalmic solution 1 drop  1 drop Both Eyes Daily PRN Karmen Bongo, MD      . sodium chloride flush (NS) 0.9 % injection 3 mL  3 mL Intravenous Q12H Karmen Bongo, MD   3 mL at 04/25/20 0855  . sorbitol 70 % solution 30 mL  30 mL Oral PRN Karmen Bongo, MD      . tamsulosin [facility]) capsule 0.4 mg  0.4 mg Oral Daily Karmen Bongo, MD   0.4 mg at 04/25/20 0854  . traMADol (ULTRAM) tablet 50 mg  50 mg Oral Q8H PRN Karmen Bongo, MD   50 mg at 04/25/20 0606  . umeclidinium bromide (INCRUSE ELLIPTA) 62.5 MCG/INH 1 puff  1 puff Inhalation Daily PRN  [place] Eric J, DO      . zolpidem (AMBIEN) tablet 5 mg  5 mg Oral QHS PRN Karmen Bongo, MD         Discharge Medications: Please see discharge summary for a list  of discharge medications.  Relevant Imaging Results:  Relevant Lab Results:   Additional Information SS#[id number]; HD MWF [place], [place]

## 2020-04-25 NOTE — TOC Initial Note (Addendum)
Transition of Care [facility]) - Initial/Assessment Note    Patient Details  Name: [name] MRN: [id number] Date of Birth: Aug 28, 1945  Transition [facility]) CM/SW Contact:    Marilu Favre, RN Phone Number: 04/25/2020, 11:50 AM  Clinical Narrative:                  Patient from home with son. PT recommendation SNF. Patient observation status , however explained there is a waiver for SNF.   NCM asked if NCM could call her son to discuss. Patient stated her son is on his way to hospital and requested NCM to come back to room in hour.   Spoke to patient and son at bedside. Discussed PT recommendations for SNF.   Discussed SNF vs home health services.   Patient has been to [facility] before. Patient agreed for NCM to fax FL2 to surrounding area , she prefers [facility] [facility]. [place] admissions and left voice mail. Will fax        Patient Goals and CMS Choice   CMS Medicare.gov Compare Post Acute Care list provided to:: Patient Choice offered to / list presented to : Patient  Expected Discharge Plan and Services     Discharge Planning Services: CM Consult   Living arrangements for the past 2 months: Single Family Home                                      Prior Living Arrangements/Services Living arrangements for the past 2 months: Single Family Home Lives with:: Adult Children Patient language and need for interpreter reviewed:: Yes        Need for Family Participation in Patient Care: Yes (Comment) Care giver support system in place?: Yes (comment)   Criminal Activity/Legal Involvement Pertinent to Current Situation/Hospitalization: No - Comment as needed  Activities of Daily Living Home Assistive Devices/Equipment: Shower chair with back, Walker (specify type), CPAP ADL Screening (condition at time of admission) Patient's cognitive ability adequate to safely complete daily activities?: Yes Is the patient deaf or have difficulty  hearing?: No Does the patient have difficulty seeing, even when wearing glasses/contacts?: No Does the patient have difficulty concentrating, remembering, or making decisions?: No Patient able to express need for assistance with ADLs?: Yes Does the patient have difficulty dressing or bathing?: No Independently performs ADLs?: Yes (appropriate for developmental age) Does the patient have difficulty walking or climbing stairs?: Yes Weakness of Legs: Both Weakness of Arms/Hands: None  Permission Sought/Granted   Permission granted to share information with : No              Emotional Assessment Appearance:: Appears stated age Attitude/Demeanor/Rapport: Avoidant Affect (typically observed): Flat Orientation: : Oriented to Self, Oriented to Place, Oriented to  Time, Oriented to Situation      Admission diagnosis:  Hyperkalemia [E87.5] Fall [W19.XXXA] ESRD needing dialysis ([place]) [N18.6, Z99.2] Fever in adult [R50.9] Contusion of left knee, initial encounter [S80.02XA] Fall, initial encounter [W19.XXXA] Skin tear of elbow without complication, initial encounter [S51.019A] Recurrent falls [R29.6] Patient Active Problem List   Diagnosis Date Noted  . Recurrent falls 04/25/2020  . Fall 04/24/2020  . ESRD (end stage renal disease) on dialysis ([place])   . Essential hypertension   . Dyslipidemia   . DM (diabetes mellitus), type 2 with renal complications ([place])   . Class 2 obesity with body mass index (BMI) of 35  to 39.9 without comorbidity   . OSA (obstructive sleep apnea)   . COPD (chronic obstructive pulmonary disease) ([place])   . PAF (paroxysmal atrial fibrillation) ([place])    PCP:  [name], MD Pharmacy:   [facility] [place], [place] [address]  [address] Phone: [phone] Fax: [phone]     Social Determinants of Health (SDOH) Interventions    Readmission Risk  Interventions No flowsheet data found.

## 2020-04-25 NOTE — Care Management Obs Status (Signed)
[place] NOTIFICATION   Patient Details  Name: [name] MRN: [id number] Date of Birth: [birth year]   Medicare Observation Status Notification Given:  Yes    Marilu Favre, RN 04/25/2020, 11:49 AM

## 2020-04-25 NOTE — Progress Notes (Signed)
PROGRESS NOTE    [name]  XTG:[id number] DOB: 1946/06/02 DOA: 04/24/2020 PCP: Rita Rio, MD    Brief Narrative:  [name] is a 74 year old female with past medical history notable for paroxysmal atrial fibrillation on Eliquis, OSA, essential hypertension, type 2 diabetes mellitus, ESRD on HD, COPD, morbid obesity who presented to the emergency department following fall.  Patient reports multiple falls over the past several months, unclear to etiology.  Patient states sometimes she remembers falling and sometimes she has a hard time recalling events leading to these falls.  She utilizes a walker at baseline.  Patient complains of left knee pain following this fall, and now unable to ambulate.   In the ED, patient was noted to have a temperature of 100.8, potassium 6.1.  5, WBC count 9.3.  CT head/C-spine with no acute findings but notes right mastoid effusion.  CT abdomen/pelvis no acute intra-abdominal findings but notes a large hiatal hernia and extensive colonic diverticulosis.  Chest x-ray with no acute cardiopulmonary finding.  Left femur with moderate degenerative change to knee.  Pelvis x-ray with no acute findings.  Left hip/fib x-ray negative.  Nephrology was consulted for HD, [facility] consulted for admission.   Assessment & Plan:   Principal Problem:   Fall Active Problems:   ESRD (end stage renal disease) on dialysis ([place])   Essential hypertension   Dyslipidemia   DM (diabetes mellitus), type 2 with renal complications ([facility])   Class 2 obesity with body mass index (BMI) of 35 to 39.9 without comorbidity   OSA (obstructive sleep apnea)   COPD (chronic obstructive pulmonary disease) ([facility])   PAF (paroxysmal atrial fibrillation) ([place])   Recurrent falls   Recurrent falls with left knee pain Patient presenting to the ED following recurrent fall.  Patient reports multiple falls over the past several months, some reported as mechanical and some unclear etiology  as she has a hard time recalling events.  At baseline she utilizes a walker for ambulation and lives with her son at home.  CT head/C-spine, left femur x-ray, pelvis x-ray, left hip/fib x-ray, CT abdomen/pelvis unrevealing for acute findings.  Left knee x-ray done this morning notable for a left knee effusion.  Attempt to work with PT this morning, unsuccessful is unable to bear weight to left lower extremity. --Consulted orthopedics, Dr. Mardelle [name] this morning for evaluation --Unlikely infectious, with normal WBC count, but consideration for hemarthrosis given her history of atrial fibrillation on anticoagulation with Eliquis; will hold Eliquis for now --Continue PT/OT efforts; currently recommending SNF placement --Social work for coordination --Await further recommendations per orthopedics  Fever, of unclear etiology Patient with a temperature of 100.8 on admission.  WBC count within normal limits.  Urinalysis unrevealing.  Chest x-ray with no acute cardiopulmonary findings. --No indication for antibiotic therapy at this time --Continue to monitor fever curve, CBC daily  Hyperkalemia: Resolved -Potassium 6.1 on admission, received HD overnight with resolution.  Potassium now down to 3.6 this morning. --Continue monitor BMP daily  ESRD on HD Patient dialyzes on a Monday/Wednesday/Friday schedule at [facility].  Underwent HD last night/early this morning. --Nephrology following, appreciate assistance --Continue HD per nephrology  Hyperlipidemia: Continue atorvastatin  Hx essential hypertension Patient not on any antihypertensives secondary to now borderline low blood pressures especially with hemodialysis. --Continue aspirin and statin --Monitor blood pressure closely --If episodes of hypotension with HD, may need to consider scheduled midodrine prior to HD sessions  Type 2 diabetes mellitus Currently diet controlled at  home.  Hemoglobin A1c 6.0, well controlled. --Continue insulin sliding  scale for coverage while inpatient --CBGs qAC/HS  Chronic hypoxic respiratory failure with COPD/OSA, not in acute exacerbation On 2 L nasal cannula at baseline. --Continue Trelegy Ellipta inhaler --Albuterol nebs as needed --CPAP nightly  Paroxysmal atrial fibrillation --Amiodarone 200 mg p.o. daily --Will hold Eliquis for concerns of hemarthrosis causing left knee pain/effusion  GERD: Continue Pepcid  Morbid obesity Body mass index is 39.79 kg/m.  Discussed with patient needs for aggressive weight loss/lifestyle changes as this complicates all facets of care.   DVT prophylaxis: SCDs, will hold Eliquis until orthopedic evaluation for knee effusion, possible hemarthrosis Code Status: Full code Family Communication: Updated patient's son, [name] via telephone  Disposition Plan:  Status is: Inpatient  Remains inpatient appropriate because:Ongoing active pain requiring inpatient pain management, Ongoing diagnostic testing needed not appropriate for outpatient work up, Unsafe d/c plan and Inpatient level of care appropriate due to severity of illness   Dispo: The patient is from: Home              Anticipated d/c is to: SNF              Anticipated d/c date is: 2 days              Patient currently is not medically stable to d/c.  Consultants:   Nephrology  Orthopedic Surgery, Dr [name]  Procedures:   None  Antimicrobials:   None   Subjective: Patient seen and examined bedside, resting comfortably.  Continues with exquisite left knee pain.  Concerned about her multiple falls as of recent.  Left knee x-ray this morning notable for effusion, pending orthopedic evaluation for possible arthrocentesis.  Discussed with patient's son, [name] via telephone; he is highly concerned about her mother's home dialysis unit and that she is not receiving adequate care there and requesting transfer to additional HD unit.  Also concerned about hypotensive episodes on dialysis.  He does not  know who her primary nephrologist is.  Patient without any other concerns or complaints at this time.  Denies headache, no dizziness, no chest pain, palpitations, no abdominal pain, no fever/chills/night sweats, no nausea cefonicid diarrhea.  No acute events overnight per nursing staff.  Objective: Vitals:   04/25/20 0100 04/25/20 0115 04/25/20 0200 04/25/20 0636  BP: (!) 90/50 (!) 98/50  (!) 112/45  Pulse: 89 99 94 93  Resp:  18 16 16   Temp:  99.8 F (37.7 C)  99.1 F (37.3 C)  TempSrc:  Oral  Oral  SpO2:  98% 94% (!) 87%  Weight:  80.5 kg    Height:        Intake/Output Summary (Last 24 hours) at 04/25/2020 1142 Last data filed at 04/25/2020 0300 Gross per 24 hour  Intake 120 ml  Output 2361 ml  Net -2241 ml   Filed Weights   04/24/20 1238 04/24/20 2104 04/25/20 0115  Weight: 71.2 kg 82 kg 80.5 kg    Examination:  General exam: Appears calm and comfortable, obese Respiratory system: Clear to auscultation. Respiratory effort normal.  On 2 L nasal cannula which is her home baseline Cardiovascular system: S1 & S2 heard, RRR. No JVD, murmurs, rubs, gallops or clicks. No pedal edema. Gastrointestinal system: Abdomen is protuberant, nondistended, soft and nontender. No organomegaly or masses felt. Normal bowel sounds heard. Central nervous system: Alert and oriented. No focal neurological deficits. Extremities: Left knee tenderness to palpation medial/lateral joint line with significant anterior tenderness to palpation  with apparent effusion Skin: No rashes, lesions or ulcers Psychiatry: Judgement and insight appear normal. Mood & affect appropriate.     Data Reviewed: I have personally reviewed following labs and imaging studies  CBC: Recent Labs  Lab 04/24/20 1312 04/25/20 0226  WBC 9.3 7.4  NEUTROABS 7.5  --   HGB 10.1* 9.1*  HCT 32.9* 29.3*  MCV 97.6 94.8  PLT 302 644   Basic Metabolic Panel: Recent Labs  Lab 04/24/20 1312 04/25/20 0226  NA 139 138  K 6.1*  3.6  CL 99 99  CO2 25 27  GLUCOSE 134* 111*  BUN 34* 8  CREATININE 7.54* 2.74*  CALCIUM 8.5* 8.2*   GFR: Estimated Creatinine Clearance: 15.1 mL/min (A) (by C-G formula based on SCr of 2.74 mg/dL (H)). Liver Function Tests: Recent Labs  Lab 04/24/20 1312  AST 14*  ALT 9  ALKPHOS 74  BILITOT 0.8  PROT 6.4*  ALBUMIN 2.8*   No results for input(s): LIPASE, AMYLASE in the last 168 hours. No results for input(s): AMMONIA in the last 168 hours. Coagulation Profile: Recent Labs  Lab 04/24/20 1312  INR 1.5*   Cardiac Enzymes: No results for input(s): CKTOTAL, CKMB, CKMBINDEX, TROPONINI in the last 168 hours. BNP (last 3 results) No results for input(s): PROBNP in the last 8760 hours. HbA1C: Recent Labs    04/24/20 1844  HGBA1C 6.0*   CBG: Recent Labs  Lab 04/24/20 1839 04/25/20 0154 04/25/20 0631 04/25/20 1107  GLUCAP 83 109* 117* 109*   Lipid Profile: No results for input(s): CHOL, HDL, LDLCALC, TRIG, CHOLHDL, LDLDIRECT in the last 72 hours. Thyroid Function Tests: No results for input(s): TSH, T4TOTAL, FREET4, T3FREE, THYROIDAB in the last 72 hours. Anemia Panel: No results for input(s): VITAMINB12, FOLATE, FERRITIN, TIBC, IRON, RETICCTPCT in the last 72 hours. Sepsis Labs: Recent Labs  Lab 04/24/20 1312 04/24/20 1844  PROCALCITON  --  0.48  LATICACIDVEN 1.5  --     Recent Results (from the past 240 hour(s))  SARS Coronavirus 2 by RT PCR (hospital order, performed in [facility] hospital lab) Nasopharyngeal Nasopharyngeal Swab     Status: None   Collection Time: 04/24/20  1:00 PM   Specimen: Nasopharyngeal Swab  Result Value Ref Range Status   SARS Coronavirus 2 NEGATIVE NEGATIVE Final    Comment: (NOTE) SARS-CoV-2 target nucleic acids are NOT DETECTED.  The SARS-CoV-2 RNA is generally detectable in upper and lower respiratory specimens during the acute phase of infection. The lowest concentration of SARS-CoV-2 viral copies this assay can detect is  250 copies / mL. A negative result does not preclude SARS-CoV-2 infection and should not be used as the sole basis for treatment or other patient management decisions.  A negative result may occur with improper specimen collection / handling, submission of specimen other than nasopharyngeal swab, presence of viral mutation(s) within the areas targeted by this assay, and inadequate number of viral copies (<250 copies / mL). A negative result must be combined with clinical observations, patient history, and epidemiological information.  Fact Sheet for Patients:   StrictlyIdeas.no  Fact Sheet for Healthcare Providers: BankingDealers.co.za  This test is not yet approved or  cleared by the [place] FDA and has been authorized for detection and/or diagnosis of SARS-CoV-2 by FDA under an Emergency Use Authorization (EUA).  This EUA will remain in effect (meaning this test can be used) for the duration of the COVID-19 declaration under Section 564(b)(1) of the Act, 21 U.S.C. section 360bbb-3(b)(1), unless  the authorization is terminated or revoked sooner.  Performed at [facility], [place] [address], [place], [place] [address]          Radiology Studies: CT ABDOMEN PELVIS WO CONTRAST  Result Date: 04/24/2020 CLINICAL DATA:  Abdominal trauma after fall.  Flank and lumbar pain EXAM: CT ABDOMEN AND PELVIS WITHOUT CONTRAST TECHNIQUE: Multidetector CT imaging of the abdomen and pelvis was performed following the standard protocol without IV contrast. COMPARISON:  CT 07/17/2019 FINDINGS: Lower chest: Bibasilar atelectasis, right worse than left. 4 mm right middle lobe pulmonary nodule and 3 mm right lower lobe pulmonary nodule (series 5, images 11 and 27). These findings are both stable from the prior study. Stable mild cardiomegaly. Large hiatal hernia. Hepatobiliary: No focal liver abnormality is identified on noncontrast imaging. Prior  cholecystectomy. No biliary dilatation. Pancreas: Grossly unremarkable. Spleen: Normal in size without focal abnormality. Adrenals/Urinary Tract: Unremarkable adrenal glands. Bilateral kidneys are mildly atrophic with chronic mild perinephric stranding, nonspecific. No renal stone or hydronephrosis. Urinary bladder is unremarkable for the degree of distension. Stomach/Bowel: Again noted is a large hiatal hernia with the majority of the stomach located within the chest. No evidence of outlet obstruction. No dilated loops of bowel. Extensive diverticulosis throughout the colon. No focal colonic thickening or pericolonic inflammatory changes. A normal appendix is present within the right lower quadrant (series 3, image 66). Vascular/Lymphatic: Extensive atherosclerosis. No aneurysm. No abdominopelvic lymphadenopathy. Reproductive: Status post hysterectomy. No adnexal masses. Other: No free fluid. No abdominopelvic fluid collection. No pneumoperitoneum. No abdominal wall hernia. Musculoskeletal: Healed fracture of the lateral aspect of the left eighth rib. Prior fusion of L4-5. No acute osseous findings. No soft tissue fluid collection or hematoma. IMPRESSION: 1. No acute findings in the abdomen or pelvis. 2. Large hiatal hernia. 3. Extensive colonic diverticulosis without evidence of acute diverticulitis. 4. Aortic atherosclerosis. (ICD10-I70.0). Electronically Signed   By: Davina Poke D.O.   On: 04/24/2020 13:43   DG Tibia/Fibula Left  Result Date: 04/24/2020 CLINICAL DATA:  [place].  Left leg pain. EXAM: LEFT TIBIA AND FIBULA - 2 VIEW COMPARISON:  None. FINDINGS: Knee and ankle joint degenerative changes are noted. No acute fracture of the tibia or fibula is identified. Extensive vascular calcifications are noted. IMPRESSION: 1. No acute bony findings. 2. Extensive vascular calcifications. Electronically Signed   By: Marijo Sanes M.D.   On: 04/24/2020 14:12   CT Head Wo Contrast  Addendum Date: 04/24/2020     ADDENDUM REPORT: 04/24/2020 13:35 ADDENDUM: Right mastoid effusion noted. Electronically Signed   By: Rolm Baptise M.D.   On: 04/24/2020 13:35   Result Date: 04/24/2020 CLINICAL DATA:  Fall on blood thinners EXAM: CT HEAD WITHOUT CONTRAST TECHNIQUE: Contiguous axial images were obtained from the base of the skull through the vertex without intravenous contrast. COMPARISON:  04/07/2020 FINDINGS: Brain: There is atrophy and chronic small vessel disease changes. No acute intracranial abnormality. Specifically, no hemorrhage, hydrocephalus, mass lesion, acute infarction, or significant intracranial injury. Vascular: No hyperdense vessel or unexpected calcification. Skull: No acute calvarial abnormality. Sinuses/Orbits: Visualized paranasal sinuses and mastoids clear. Orbital soft tissues unremarkable. Other: None IMPRESSION: Atrophy, chronic microvascular disease. No acute intracranial abnormality. Electronically Signed: By: Rolm Baptise M.D. On: 04/24/2020 13:32   CT Cervical Spine Wo Contrast  Result Date: 04/24/2020 CLINICAL DATA:  Fall, neck trauma EXAM: CT CERVICAL SPINE WITHOUT CONTRAST TECHNIQUE: Multidetector CT imaging of the cervical spine was performed without intravenous contrast. Multiplanar CT image reconstructions were also generated.  COMPARISON:  04/07/2020 FINDINGS: Alignment: Normal Skull base and vertebrae: No acute fracture. No primary bone lesion or focal pathologic process. Soft tissues and spinal canal: No prevertebral fluid or swelling. No visible canal hematoma. Disc levels: Diffuse degenerative disc disease with disc space narrowing and spurring. Diffuse degenerative facet disease bilaterally. Upper chest: No acute findings Other: Fluid seen within the right mastoid air cells. IMPRESSION: Diffuse degenerative disc and facet disease. No acute bony abnormality. Right mastoid effusion. Electronically Signed   By: Rolm Baptise M.D.   On: 04/24/2020 13:34   DG Pelvis Portable  Result  Date: 04/24/2020 CLINICAL DATA:  [place].  Pelvic pain. EXAM: PORTABLE PELVIS 1-2 VIEWS COMPARISON:  None. FINDINGS: Both hips are normally located. No definite hip or pelvic fractures. The pubic symphysis and SI joints are maintained. Extensive vascular calcifications. IMPRESSION: No acute bony findings. Electronically Signed   By: Marijo Sanes M.D.   On: 04/24/2020 14:11   DG Chest Port 1 View  Result Date: 04/24/2020 CLINICAL DATA:  [place]. EXAM: PORTABLE CHEST 1 VIEW COMPARISON:  04/07/2020 FINDINGS: The heart is enlarged but stable. Stable right IJ Port-A-Cath and left IJ dialysis catheter. No infiltrates, edema or effusions. No pneumothorax. The bony thorax is intact. IMPRESSION: No acute cardiopulmonary findings. Electronically Signed   By: Marijo Sanes M.D.   On: 04/24/2020 14:13   DG Knee Complete 4 Views Left  Result Date: 04/25/2020 CLINICAL DATA:  Fall yesterday with left knee pain EXAM: LEFT KNEE - COMPLETE 4+ VIEW COMPARISON:  10/01/2017 FINDINGS: Joint effusion without acute fracture or subluxation. Tricompartmental osteoarthritis with lateral translation of the tibia and degenerative spurring primarily at the medial and patellofemoral compartments. Extensive arterial calcification. IMPRESSION: 1. Joint effusion without acute fracture. 2. Tricompartmental osteoarthritis. Electronically Signed   By: Monte Fantasia M.D.   On: 04/25/2020 10:15   DG Femur Min 2 Views Left  Result Date: 04/24/2020 CLINICAL DATA:  [place].  Left femur pain. EXAM: LEFT FEMUR 2 VIEWS COMPARISON:  None. FINDINGS: The hip and knee joints are maintained. Moderate degenerative changes. No acute hip or femur fractures are identified. Extensive vascular calcifications. IMPRESSION: 1. No acute bony findings. 2. Moderate hip and knee joint degenerative changes. Electronically Signed   By: Marijo Sanes M.D.   On: 04/24/2020 14:10        Scheduled Meds: . allopurinol  100 mg Oral BID  . amiodarone  200 mg Oral Daily    . apixaban  2.5 mg Oral BID  . aspirin EC  81 mg Oral Daily  . atorvastatin  40 mg Oral QHS  . [START ON 04/26/2020] calcitRIOL  1.75 mcg Oral Q M,W,F-HD  . Chlorhexidine Gluconate Cloth  6 each Topical Q0600  . famotidine  10 mg Oral QHS  . ferric citrate  420 mg Oral TID WC  . gabapentin  100 mg Oral QHS  . insulin aspart  0-6 Units Subcutaneous TID WC  . [START ON 04/26/2020] midodrine  2.5 mg Oral Q M,W,F-HD  . pantoprazole  40 mg Oral Daily  . sodium chloride flush  3 mL Intravenous Q12H  . tamsulosin  0.4 mg Oral Daily   Continuous Infusions:   LOS: 0 days    Time spent: 38 minutes spent on chart review, discussion with nursing staff, consultants, updating family and interview/physical exam; more than 50% of that time was spent in counseling and/or coordination of care.    Gwynevere Lizana J [place] DO [facility] Available via Epic secure chat 7am-7pm After  these hours, please refer to coverage provider listed on [email or web address] 04/25/2020, 11:42 AM

## 2020-04-25 NOTE — Progress Notes (Signed)
Blood sugar 109

## 2020-04-25 NOTE — Progress Notes (Signed)
[name] Progress Note  Subjective: pt had HD overnight last night. K down to 3.6.  Working w/ OT this am. Sitting on side of bed w/o assist.   Vitals:   04/25/20 0115 04/25/20 0200 04/25/20 0636 04/25/20 1148  BP: (!) 98/50  (!) 112/45 (!) 114/53  Pulse: 99 94 93 82  Resp: 18 16 16 16   Temp: 99.8 F (37.7 C)  99.1 F (37.3 C) 99.3 F (37.4 C)  TempSrc: Oral  Oral Oral  SpO2: 98% 94% (!) 87% 96%  Weight: 80.5 kg     Height:        Exam: General appearance: tired, obese, deconditioned, no distress Resp: clear to auscultation bilaterally Cardio: regular rate and rhythm, S1, S2 normal, no murmur, click, rub or gallop GI: soft, non-tender; bowel sounds normal; no masses,  no organomegaly Extremities: extremities normal, atraumatic, no cyanosis or edema left sided TDC-  no signs of infection-  left arm AVF-  small w/ some infiltrations it appears  [facility] HD: [facility]    4h  78kg  P4  Hep none  2/2.5 bath  TDC/ LUA AVF  Calcitriol 1.75  mircera 75 q 2 venofer 100   Last hgb 10.3, calc 8.1, phos 7.9, pth 418   Summary: 74 year old WF with many medical problems in addition to ESRD-  Presents after mechanical fall with pain and temp  Assessment/ Plan: 1. S/p fall-  Many x-rays all negative-  Conservative management/PT- admit for obs. OT working w/ pt today.  2. Fever-  Cultures obtained-  U/A and cxr clear, all imaging clear as well-  WBC 9K-   TDC looks good, no evidence on infection. UCx sent. COVID neg.  Low grade fever.  3. ESRD: cont HD here on MWF schedule using the [facility].  AVF seems small and bruised- unclear how much they have been using  4. Hypertension./vol: regularly gets midodrine 2.5 mg pre HD, 2.5kg up today. UF same on next hd 5. Anemia of ESRD: hgb over 10-  No ESA needed at present. Resume IV Fe at dc.  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 COPD/ OSA 8. Afib on eliquis 9. Gout 10. DM2   Rob [name] 04/25/2020, 12:03 PM   Recent Labs  Lab  04/24/20 1312 04/25/20 0226  K 6.1* 3.6  BUN 34* 8  CREATININE 7.54* 2.74*  CALCIUM 8.5* 8.2*  HGB 10.1* 9.1*   Inpatient medications:  allopurinol  100 mg Oral BID   amiodarone  200 mg Oral Daily   apixaban  2.5 mg Oral BID   aspirin EC  81 mg Oral Daily   atorvastatin  40 mg Oral QHS   [START ON 04/26/2020] calcitRIOL  1.75 mcg Oral Q M,W,F-HD   Chlorhexidine Gluconate Cloth  6 each Topical Q0600   famotidine  10 mg Oral QHS   ferric citrate  420 mg Oral TID WC   gabapentin  100 mg Oral QHS   insulin aspart  0-6 Units Subcutaneous TID WC   [START ON 04/26/2020] midodrine  2.5 mg Oral Q M,W,F-HD   pantoprazole  40 mg Oral Daily   sodium chloride flush  3 mL Intravenous Q12H   tamsulosin  0.4 mg Oral Daily    acetaminophen **OR** acetaminophen, albuterol, calcium carbonate (dosed in mg elemental calcium), camphor-menthol **AND** hydrOXYzine, docusate sodium, feeding supplement (NEPRO CARB STEADY), fentaNYL (SUBLIMAZE) injection, ondansetron **OR** ondansetron (ZOFRAN) IV, polyvinyl alcohol, sorbitol, traMADol, umeclidinium bromide, zolpidem

## 2020-04-25 NOTE — Evaluation (Signed)
Occupational Therapy Evaluation Patient Details Name: [name] MRN: [id number] DOB: 07/21/1946 Today's Date: 04/25/2020    History of Present Illness Patient is a 74 y/o female who presents with left knee pain s/p fall. XRAY of left knee planned 04/25/20- imaging with no acute fx. PMH includes PAF, OSA, HTN, DM, ESRD on HD, COPD, obesity.   Clinical Impression   PTA patient independent with mobility using RW, some assist for LB ADLs but otherwise completing ADLs, limited IADLs (son drives). Admitted for above and limited by problem list below, including impaired balance, L knee pain, decreased activity tolerance, generalized weakness.  Patient currently requires min assist for sit to stand but limited tolerance in standing, scoots towards HOB with min guard, ADLs with setup to max assist.  She presents with flat affect, slow to respond, slow processing and requires verbal cueing, anticipate near baseline though.  She will benefit from continued OT services while and after dc at SNF level to optimize independence and return to PLOF.  Will follow acutely.      Follow Up Recommendations  SNF;Supervision/Assistance - 24 hour    Equipment Recommendations  3 in 1 bedside commode    Recommendations for Other Services       Precautions / Restrictions Precautions Precautions: Fall Restrictions Weight Bearing Restrictions: No      Mobility Bed Mobility Overal bed mobility: Needs Assistance Bed Mobility: Supine to Sit;Sit to Supine     Supine to sit: Supervision;HOB elevated Sit to supine: Supervision   General bed mobility comments: increased time and effort, but no physical assist required  Transfers Overall transfer level: Needs assistance Equipment used: Rolling walker (2 wheeled) Transfers: Sit to/from Stand;Lateral/Scoot Transfers Sit to Stand: Min assist        Lateral/Scoot Transfers: Min guard General transfer comment: pt requires min assist to power up and  steady at EOB, unable to sustain standing and poor weightbearing through L LE.  Scooting towards [facility] with min guard assist for safety given increased time.      Balance Overall balance assessment: History of Falls;Needs assistance Sitting-balance support: Feet supported;Single extremity supported Sitting balance-Leahy Scale: Fair Sitting balance - Comments: supervision for safety.   Standing balance support: During functional activity;Bilateral upper extremity supported Standing balance-Leahy Scale: Poor Standing balance comment: Requires UE supoprt and external support; not placing much weight through LLE due to pain.                           ADL either performed or assessed with clinical judgement   ADL Overall ADL's : Needs assistance/impaired     Grooming: Set up;Sitting   Upper Body Bathing: Set up;Sitting   Lower Body Bathing: Maximal assistance;Sit to/from stand   Upper Body Dressing : Sitting;Minimal assistance   Lower Body Dressing: Maximal assistance;Sit to/from Health and safety inspector Details (indicate cue type and reason): deferred          Functional mobility during ADLs: Minimal assistance;Cueing for safety General ADL Comments: pt limited by weakness, pain and decreased activity tolerance     Vision   Vision Assessment?: No apparent visual deficits     Perception     Praxis      Pertinent Vitals/Pain Pain Assessment: Faces Pain Score: 6  Faces Pain Scale: Hurts little more Pain Location: left knee Pain Descriptors / Indicators: Sore;Sharp;Constant;Grimacing;Guarding Pain Intervention(s): Limited activity within patient's tolerance;Monitored during session;Repositioned     Hand Dominance  Extremity/Trunk Assessment Upper Extremity Assessment Upper Extremity Assessment: Generalized weakness   Lower Extremity Assessment Lower Extremity Assessment: Defer to PT evaluation LLE Deficits / Details: Limited knee flexion/extension  AROM secondary to pain. AROM more painful that PROM of knee joint. Some warmth appreciated. LLE: Unable to fully assess due to pain LLE Sensation: WNL       Communication Communication Communication: No difficulties   Cognition Arousal/Alertness: Awake/alert Behavior During Therapy: Flat affect Overall Cognitive Status: Impaired/Different from baseline Area of Impairment: Problem solving                             Problem Solving: Slow processing;Requires verbal cues General Comments: patient with flat affect, slow processing and requires verbal cues. Anticipate near baseline.    General Comments  VSS on supplemental O2    Exercises     Shoulder Instructions      Home Living Family/patient expects to be discharged to:: Private residence Living Arrangements: Children Available Help at Discharge: Family;Available 24 hours/day Type of Home: House Home Access: Level entry     Home Layout: One level     Bathroom Shower/Tub: Teacher, early years/pre: Standard     Home Equipment: Environmental consultant - 2 wheels          Prior Functioning/Environment Level of Independence: Needs assistance  Gait / Transfers Assistance Needed: uses RW for mobility  ADL's / Homemaking Assistance Needed: son assists with LB ADLs, basin bathing only, limited iADLs and son drives   Comments: ~2 falls in the last 6 months         OT Problem List: Decreased strength;Decreased activity tolerance;Impaired balance (sitting and/or standing);Decreased knowledge of use of DME or AE;Decreased knowledge of precautions;Pain;Obesity      OT Treatment/Interventions: Self-care/ADL training;Energy conservation;DME and/or AE instruction;Therapeutic activities;Balance training;Patient/family education    OT Goals(Current goals can be found in the care plan section) Acute Rehab OT Goals Patient Stated Goal: improve pain OT Goal Formulation: With patient Time For Goal Achievement:  05/09/20 Potential to Achieve Goals: Good  OT Frequency: Min 2X/week   Barriers to D/C:            Co-evaluation              AM-PAC OT "6 Clicks" Daily Activity     Outcome Measure Help from another person eating meals?: None Help from another person taking care of personal grooming?: A Little Help from another person toileting, which includes using toliet, bedpan, or urinal?: A Lot Help from another person bathing (including washing, rinsing, drying)?: A Lot Help from another person to put on and taking off regular upper body clothing?: A Little Help from another person to put on and taking off regular lower body clothing?: A Lot 6 Click Score: 16   End of Session Equipment Utilized During Treatment: Gait belt;Rolling walker Nurse Communication: Mobility status  Activity Tolerance: Patient limited by pain Patient left: in bed;with call bell/phone within reach;with bed alarm set  OT Visit Diagnosis: Other abnormalities of gait and mobility (R26.89);Muscle weakness (generalized) (M62.81);Pain;History of falling (Z91.81) Pain - Right/Left: Left Pain - part of body: Knee                Time: 4174-0814 OT Time Calculation (min): 21 min Charges:  OT General Charges $OT Visit: 1 Visit OT Evaluation $OT Eval Moderate Complexity: [place], OT Acute Rehabilitation Services Pager [phone] Office [phone]  Delight Stare 04/25/2020, 12:48 PM

## 2020-04-25 NOTE — Consult Note (Addendum)
ORTHOPAEDIC CONSULTATION  REQUESTING PHYSICIAN: [place] Eric J, DO  Chief Complaint: Left knee pain  HPI: [name] is a 74 y.o. female with a history of ESRD on dialysis, COPD, morbid obesity, HTN, OSA, and paroxysmal atrial fibrillation on Eliquis who complains of left knee pain. History obtained from both patient and son in room.  Son states patient started complaining of left knee pain and swelling on the morning 9/20. Shortly after, she went to leave the house and her left knee gave way causing her to fall backwards. She landed in a seated position. States she did not fall directly on left knee, did hit her head. Today patient states pain is severe and worse with movement. Patient denies any history of septic joints. She does have a history of gout flares, which has so far only occured in bilateral feet, for which patient takes allopurinol 100 mg BID. Denies paraesthesias, denies pain or swelling at other joints, denies fever or chills.   Past Medical History:  Diagnosis Date   Class 2 obesity with body mass index (BMI) of 35 to 39.9 without comorbidity    COPD (chronic obstructive pulmonary disease) (HCC)    DM (diabetes mellitus), type 2 with renal complications (HCC)    Dyslipidemia    ESRD (end stage renal disease) on dialysis (HCC)    Essential hypertension    OSA (obstructive sleep apnea)    PAF (paroxysmal atrial fibrillation) ([place])    History reviewed. No pertinent surgical history. Social History   Socioeconomic History   Marital status: Widowed    Spouse name: Not on file   Number of children: Not on file   Years of education: Not on file   Highest education level: Not on file  Occupational History   Occupation: retired  Tobacco Use   Smoking status: Former Smoker   Smokeless tobacco: Never Used   Tobacco comment: 1 year in her lifetime  Substance and Sexual Activity   Alcohol use: Never   Drug use: Never   Sexual activity: Not on  file  Other Topics Concern   Not on file  Social History Narrative   Not on file   Social Determinants of Health   Financial Resource Strain:    Difficulty of Paying Living Expenses: Not on file  Food Insecurity:    Worried About Charity fundraiser in the Last Year: Not on file   [place] in the Last Year: Not on file  Transportation Needs:    Lack of Transportation (Medical): Not on file   Lack of Transportation (Non-Medical): Not on file  Physical Activity:    Days of Exercise per Week: Not on file   Minutes of Exercise per Session: Not on file  Stress:    Feeling of Stress : Not on file  Social Connections:    Frequency of Communication with Friends and Family: Not on file   Frequency of Social Gatherings with Friends and Family: Not on file   Attends Religious Services: Not on file   Active Member of Clubs or Organizations: Not on file   Attends Archivist Meetings: Not on file   Marital Status: Not on file   History reviewed. No pertinent family history. No Known Allergies   Positive ROS: All other systems have been reviewed and were otherwise negative with the exception of those mentioned in the HPI and as above.  Physical Exam: General: Alert, no acute distress Cardiovascular: No pedal edema.  Respiratory: No  cyanosis, no use of accessory musculature. Nasal cannula in place. GI: abdomen is soft and non-tender Skin: No lesions noted at left knee. Mild ecchymosis noted at right knee. Neurologic: Sensation intact distally Psychiatric: Patient is competent for consent with normal mood and affect Lymphatic: No axillary or cervical lymphadenopathy  MUSCULOSKELETAL: LLE- AROM left knee 10-20 degrees. PROM 0-45 with pain. No ecchymosis noted, significant left knee effusion. TTP diffusely at left knee. Dorsiflexion and Plantarflexion intact. Distal sensation intact throughout foot. +DP pulse.   Assessment/Plan:  Painful left knee with  effusion:  - x-rays taken show no fracture, does show osteoarthritis, most pronounced in medial compartment - will plan to aspirate left knee and inject with DepoMedrol, sample will be sent for crystal analysis as well as gram stain and culture - further recommendations to be made from these results - patient can weight bear as tolerated and work with PT as tolerated  - no surgical intervention needed    PRE-PROCEDURE DIAGNOSIS:  Left knee effusion POST-OPERATIVE DIAGNOSIS:  Same PROCEDURE:  Aspiration / Intra-articular injection left knee  PROCEDURE DETAILS:  After informed verbal consent was obtained the superolateral portal was prepped with chlorhexidine. Approximately 3 mL of marcaine were injected superficially and then into the deeper tissues and into the joint space. Flash identified appropriate positioning and pressurized joint space. Medicine was left to sit for several minutes to take effect. Next, a syringe with an 18-gauge needle was used to aspirate approximately 20 ml of yellow fluid. Patient was unable to tolerate further aspiration due to pain.  2 ml of Marcaine and 1 ml of Depo-Medrol (40mg ) was then injected into the joint space. She tolerated this well without complication and a Band-Aid was placed.    Ventura Bruns, PA-C    04/25/2020 2:01 PM

## 2020-04-25 NOTE — Evaluation (Signed)
Physical Therapy Evaluation Patient Details Name: [name] MRN: [id number] DOB: 06-Jan-1946 Today's Date: 04/25/2020   History of Present Illness  Patient is a 74 y/o female who presents with left knee pain s/p fall. XRAY of left knee planned 04/25/20. PMH includes PAF, OSA, HTN, DM, ESRD on HD, COPD, obesity.  Clinical Impression  Patient presents with left knee pain, generalized weakness and impaired mobility s/p above. Pt lives at home with son and reports being Mod I with RW and ADLs PTA. Son does all IADLS. Reports a few falls in the last 6 months. Today, pt requires min-mod A for bed mobility and standing from EOB. Pt reluctant to place weight through LLE due to pain. Unable to tolerate ambulation today. Would benefit from SNF to maximize independence and mobility prior to return home. If son able to manage caring for this pt in this state, recommend HHPT. Will follow acutely.    Follow Up Recommendations SNF;Supervision for mobility/OOB    Equipment Recommendations  None recommended by PT    Recommendations for Other Services       Precautions / Restrictions Precautions Precautions: Fall Restrictions Weight Bearing Restrictions: No      Mobility  Bed Mobility Overal bed mobility: Needs Assistance Bed Mobility: Supine to Sit;Sit to Supine     Supine to sit: Min assist;HOB elevated Sit to supine: Min assist;HOB elevated   General bed mobility comments: Assist with LLE to get to EOB with increased time; assist to bring LLE into bed.  Transfers Overall transfer level: Needs assistance Equipment used: Rolling walker (2 wheeled) Transfers: Sit to/from Stand Sit to Stand: Mod assist         General transfer comment: Assist to power to standing; pt reluctant to place weight through LLE upon standing, flexed trunk. Limited tolerance due to pain.  Ambulation/Gait             General Gait Details: unable due to pain  Stairs            Wheelchair  Mobility    Modified Rankin (Stroke Patients Only)       Balance Overall balance assessment: History of Falls;Needs assistance Sitting-balance support: Feet supported;Single extremity supported Sitting balance-Leahy Scale: Fair Sitting balance - Comments: supervision for safety.   Standing balance support: During functional activity Standing balance-Leahy Scale: Poor Standing balance comment: Requires UE supoprt and external support; not placing much weight through LLE due to pain.                             Pertinent Vitals/Pain Pain Assessment: 0-10 Pain Score: 6  Pain Location: left knee Pain Descriptors / Indicators: Sore;Sharp;Constant;Grimacing;Guarding Pain Intervention(s): Monitored during session;Repositioned;Limited activity within patient's tolerance    Home Living Family/patient expects to be discharged to:: Private residence Living Arrangements: Children (son) Available Help at Discharge: Family;Available 24 hours/day Type of Home: House Home Access: Level entry     Home Layout: One level Home Equipment: Walker - 2 wheels;Shower seat;Bedside commode      Prior Function Level of Independence: Independent with assistive device(s)         Comments: Does ADLs, son does IADls. No driving. Had ~2 falls in last 6 months.     Hand Dominance        Extremity/Trunk Assessment   Upper Extremity Assessment Upper Extremity Assessment: Defer to OT evaluation    Lower Extremity Assessment Lower Extremity Assessment: Generalized weakness;LLE deficits/detail LLE Deficits /  Details: Limited knee flexion/extension AROM secondary to pain. AROM more painful that PROM of knee joint. Some warmth appreciated. LLE: Unable to fully assess due to pain LLE Sensation: WNL       Communication   Communication: No difficulties  Cognition Arousal/Alertness: Awake/alert Behavior During Therapy: WFL for tasks assessed/performed Overall Cognitive Status:  Within Functional Limits for tasks assessed                                 General Comments: Requires repetition at times likely due to hearing, otherwise seems [facility]      General Comments General comments (skin integrity, edema, etc.): VSS on supplemental 02, Sp02 >94%    Exercises     Assessment/Plan    PT Assessment Patient needs continued PT services  PT Problem List Decreased strength;Decreased mobility;Obesity;Decreased range of motion;Decreased activity tolerance;Pain;Decreased balance       PT Treatment Interventions Therapeutic activities;Gait training;Therapeutic exercise;Patient/family education;Balance training;Functional mobility training    PT Goals (Current goals can be found in the Care Plan section)  Acute Rehab PT Goals Patient Stated Goal: improve pain PT Goal Formulation: With patient Time For Goal Achievement: 05/09/20 Potential to Achieve Goals: Fair    Frequency Min 2X/week   Barriers to discharge        Co-evaluation               AM-PAC PT "6 Clicks" Mobility  Outcome Measure Help needed turning from your back to your side while in a flat bed without using bedrails?: A Little Help needed moving from lying on your back to sitting on the side of a flat bed without using bedrails?: A Little Help needed moving to and from a bed to a chair (including a wheelchair)?: A Lot Help needed standing up from a chair using your arms (e.g., wheelchair or bedside chair)?: A Lot Help needed to walk in hospital room?: A Lot Help needed climbing 3-5 steps with a railing? : A Lot 6 Click Score: 14    End of Session Equipment Utilized During Treatment: Gait belt;Oxygen Activity Tolerance: Patient limited by pain Patient left: in bed;with call bell/phone within reach;with bed alarm set Nurse Communication: Mobility status PT Visit Diagnosis: Pain;Difficulty in walking, not elsewhere classified (R26.2);Muscle weakness (generalized)  (M62.81) Pain - Right/Left: Left Pain - part of body: Knee    Time: 0932-3557 PT Time Calculation (min) (ACUTE ONLY): 21 min   Charges:   PT Evaluation $PT Eval Moderate Complexity: 1 Mod          Marisa Severin, PT, DPT Acute Rehabilitation Services Pager [phone] Office [phone]      [name] 04/25/2020, 9:50 AM

## 2020-04-26 LAB — GLUCOSE, CAPILLARY
Glucose-Capillary: 138 mg/dL — ABNORMAL HIGH (ref 70–99)
Glucose-Capillary: 171 mg/dL — ABNORMAL HIGH (ref 70–99)
Glucose-Capillary: 146 mg/dL — ABNORMAL HIGH (ref 70–99)
Glucose-Capillary: 109 mg/dL — ABNORMAL HIGH (ref 70–99)

## 2020-04-26 LAB — CBC
HCT: 31.8 % — ABNORMAL LOW (ref 36.0–46.0)
Hemoglobin: 10 g/dL — ABNORMAL LOW (ref 12.0–15.0)
MCH: 30 pg (ref 26.0–34.0)
MCHC: 31.4 g/dL (ref 30.0–36.0)
MCV: 95.5 fL (ref 80.0–100.0)
Platelets: 259 10*3/uL (ref 150–400)
RBC: 3.33 MIL/uL — ABNORMAL LOW (ref 3.87–5.11)
RDW: 14.5 % (ref 11.5–15.5)
WBC: 6.4 10*3/uL (ref 4.0–10.5)
nRBC: 0 % (ref 0.0–0.2)

## 2020-04-26 LAB — BASIC METABOLIC PANEL
Anion gap: 14 (ref 5–15)
BUN: 23 mg/dL (ref 8–23)
CO2: 24 mmol/L (ref 22–32)
Calcium: 8.5 mg/dL — ABNORMAL LOW (ref 8.9–10.3)
Chloride: 98 mmol/L (ref 98–111)
Creatinine, Ser: 5.2 mg/dL — ABNORMAL HIGH (ref 0.44–1.00)
GFR calc Af Amer: 9 mL/min — ABNORMAL LOW (ref >60)
GFR calc non Af Amer: 8 mL/min — ABNORMAL LOW (ref >60)
Glucose, Bld: 152 mg/dL — ABNORMAL HIGH (ref 70–99)
Potassium: 5.4 mmol/L — ABNORMAL HIGH (ref 3.5–5.1)
Sodium: 136 mmol/L (ref 135–145)

## 2020-04-26 LAB — SARS CORONAVIRUS 2 BY RT PCR (HOSPITAL ORDER, PERFORMED IN ~~LOC~~ HOSPITAL LAB): SARS Coronavirus 2: NEGATIVE

## 2020-04-26 MED ORDER — APIXABAN 2.5 MG PO TABS
2.5000 mg | ORAL_TABLET | Freq: Two times a day (BID) | ORAL | Status: DC
  Administered 2020-04-26 – 2020-04-28 (×5): 2.5 mg via ORAL
  Filled 2020-04-26 (×5): qty 1

## 2020-04-26 MED ORDER — DICLOFENAC SODIUM 1 % EX GEL
2.0000 g | Freq: Four times a day (QID) | CUTANEOUS | Status: DC
  Administered 2020-04-26 – 2020-04-28 (×7): 2 g via TOPICAL
  Filled 2020-04-26: qty 100

## 2020-04-26 NOTE — Progress Notes (Signed)
Report received from HD nurse, reports pt was on for 3.5hrs and 2.3L pulled. Pt back to floor at this time. No complaints voiced, will cont to monitor.

## 2020-04-26 NOTE — Progress Notes (Signed)
Pt placed on CPAP. Pt is tolerating it well. RT will continue to monitor.

## 2020-04-26 NOTE — TOC Progression Note (Signed)
Transition of Care [facility]) - Progression Note    Patient Details  Name: [name] MRN: [id number] Date of Birth: [birth year]  Transition of Care [facility]) CM/SW [place], [place] Phone Number: 04/26/2020, 4:25 PM  Clinical Narrative:    CSW met with pt at bedside and also have spoken with her via telephone. Provided pt with multiple bed offers- she has chosen SNF placement at [facility]. Appreciate renal navigator's assistance with trying to adjust dialysis schedule.   Expected Discharge Plan: [place] Barriers to Discharge: Continued Medical Work up  Expected Discharge Plan and Services Expected Discharge Plan: [place] In-house Referral: Clinical Social Work Discharge Planning Services: CM Consult, DC out of service area (changing OP dialysis) Post Acute Care Choice: [place] arrangements for the past 2 months: Single Family Home  Readmission Risk Interventions No flowsheet data found.

## 2020-04-26 NOTE — Discharge Instructions (Signed)

## 2020-04-26 NOTE — Progress Notes (Signed)
Renal Navigator received call from CSW/I. [place] stating patient's first choice for SNF is [facility], as she has been there in the past. This would require a temporary change in OP HD clinics. Navigator has reached out to [facility] to see if they are able to accommodate patient while in short term rehab and will follow closely.  Alphonzo Cruise, [place] Renal Navigator [phone]

## 2020-04-26 NOTE — Progress Notes (Signed)
[facility] Progress Note  Subjective: pt feels some better today, going to SNF.   Vitals:   04/25/20 2354 04/26/20 0015 04/26/20 0609 04/26/20 1259  BP: (!) 120/54  (!) 124/52 (!) 117/44  Pulse: 75 72 77 82  Resp: 17 18 16 17   Temp: 98.4 F (36.9 C)  97.8 F (36.6 C) 98.9 F (37.2 C)  TempSrc: Oral  Oral Oral  SpO2: 93% 94% 91% 91%  Weight:      Height:        Exam: General appearance: tired, obese, deconditioned, no distress Resp: clear to auscultation bilaterally Cardio: regular rate and rhythm, S1, S2 normal GI: soft, non-tender; bowel sounds normal; no masses Extremities: no edema  L IJ TDC,  left arm AVF +bruit, some bruising  OP HD: [facility]    4h  78kg  P4  Hep none  2/2.5 bath  TDC/ LUA AVF  Calcitriol 1.75  mircera 75 q 2 venofer 100   Last hgb 10.3, calc 8.1, phos 7.9, pth 418   Summary: 74 year old WF with many medical problems in addition to ESRD-  Presents after mechanical fall with pain and temp  Assessment/ Plan: 1. S/p fall-  Many x-rays all negative-  Conservative management/PT- admit for obs. SNF placement recommended.  2. Fever-  Cultures obtained-  U/A and cxr clear, all imaging clear as well. Blood cx's neg. Fevers resolved. Not on abx.  3. ESRD: cont HD here on MWF schedule using the [facility].  AVF seems small and bruised- unclear how much they have been using. HD today.  4. Hypertension./vol: regularly gets midodrine 2.5 mg pre HD. 2.3 kg off w/ HD yest.  5. Anemia of ESRD: hgb over 10-  No ESA needed at present. Resume IV Fe at dc.  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 COPD/ OSA 8. Afib on eliquis 9. Gout 10. DM2   Rob Vyctoria Dickman 04/26/2020, 3:15 PM   Recent Labs  Lab 04/25/20 0226 04/26/20 0514  K 3.6 5.4*  BUN 8 23  CREATININE 2.74* 5.20*  CALCIUM 8.2* 8.5*  HGB 9.1* 10.0*   Inpatient medications:  allopurinol  100 mg Oral BID   amiodarone  200 mg Oral Daily   apixaban  2.5 mg Oral BID   aspirin EC  81 mg Oral  Daily   atorvastatin  40 mg Oral QHS   bupivacaine  10 mL Infiltration Once   calcitRIOL  1.75 mcg Oral Q M,W,F-HD   Chlorhexidine Gluconate Cloth  6 each Topical Q0600   diclofenac Sodium  2 g Topical QID   famotidine  10 mg Oral QHS   ferric citrate  420 mg Oral TID WC   gabapentin  100 mg Oral QHS   insulin aspart  0-6 Units Subcutaneous TID WC   methylPREDNISolone acetate  40 mg Intra-articular Once   midodrine  2.5 mg Oral Q M,W,F-HD   pantoprazole  40 mg Oral Daily   sodium chloride flush  3 mL Intravenous Q12H   tamsulosin  0.4 mg Oral Daily    acetaminophen **OR** acetaminophen, albuterol, calcium carbonate (dosed in mg elemental calcium), camphor-menthol **AND** hydrOXYzine, docusate sodium, feeding supplement (NEPRO CARB STEADY), fentaNYL (SUBLIMAZE) injection, ondansetron **OR** ondansetron (ZOFRAN) IV, polyvinyl alcohol, sorbitol, traMADol, umeclidinium bromide, zolpidem

## 2020-04-26 NOTE — Progress Notes (Signed)
PROGRESS NOTE    [name]  GUR:[id number] DOB: 1945/12/16 DOA: 04/24/2020 PCP: [name], MD    Brief Narrative:  [name] is a 74 year old female with past medical history notable for paroxysmal atrial fibrillation on Eliquis, OSA, essential hypertension, type 2 diabetes mellitus, ESRD on HD, COPD, morbid obesity who presented to the emergency department following fall.  Patient reports multiple falls over the past several months, unclear to etiology.  Patient states sometimes she remembers falling and sometimes she has a hard time recalling events leading to these falls.  She utilizes a walker at baseline.  Patient complains of left knee pain following this fall, and now unable to ambulate.     Assessment & Plan:   Principal Problem:   Fall Active Problems:   ESRD (end stage renal disease) on dialysis ([place])   Essential hypertension   Dyslipidemia   DM (diabetes mellitus), type 2 with renal complications (HCC)   Class 2 obesity with body mass index (BMI) of 35 to 39.9 without comorbidity   OSA (obstructive sleep apnea)   COPD (chronic obstructive pulmonary disease) (HCC)   PAF (paroxysmal atrial fibrillation) ([place])   Recurrent falls   Recurrent falls with left knee pain Patient presenting to the ED following recurrent fall.  Patient reports multiple falls over the past several months, some reported as mechanical and some unclear etiology as she has a hard time recalling events.  At baseline she utilizes a walker for ambulation and lives with her son at home.  CT head/C-spine, left femur x-ray, pelvis x-ray, left hip/fib x-ray, CT abdomen/pelvis unrevealing for acute findings.  Left knee x-ray done this morning notable for a left knee effusion.  --Consulted orthopedics, Dr. Mardelle Matte for knee evaluation: s/p injection/arthrocentesis: consistent with her degenerative arthritic condition, and inflammatory arthropathy --Continue PT/OT efforts; currently  recommending SNF placement  Fever, of unclear etiology Patient with a temperature of 100.8 on admission.  WBC count within normal limits.  Urinalysis unrevealing.  Chest x-ray with no acute cardiopulmonary findings. --No indication for antibiotic therapy at this time-- resolved  Hyperkalemia:  -per HD  ESRD on HD Patient dialyzes on a Monday/Wednesday/Friday schedule at [facility].  Underwent HD last night/early this morning. --Nephrology following, appreciate assistance --Continue HD per nephrology  Hyperlipidemia: - Continue atorvastatin  Hx essential hypertension Patient not on any antihypertensives secondary to now borderline low blood pressures especially with hemodialysis. --Continue aspirin and statin --Monitor blood pressure closely --If episodes of hypotension with HD, may need to consider scheduled midodrine prior to HD sessions  Type 2 diabetes mellitus Currently diet controlled at home.  Hemoglobin A1c 6.0, well controlled. --Continue insulin sliding scale for coverage while inpatient --CBGs qAC/HS  Chronic hypoxic respiratory failure with COPD/OSA, not in acute exacerbation On 2 L nasal cannula at baseline. --Continue Trelegy Ellipta inhaler --Albuterol nebs as needed --CPAP nightly  Paroxysmal atrial fibrillation --Amiodarone 200 mg p.o. daily --resume eliquis  GERD: Continue Pepcid  Morbid obesity Body mass index is 39.79 kg/m.  Discussed with patient needs for aggressive weight loss/lifestyle changes as this complicates all facets of care.   DVT prophylaxis: eliquis Code Status: Full code   Disposition Plan:  Status is: Inpatient  Remains inpatient appropriate because:Ongoing active pain requiring inpatient pain management, Ongoing diagnostic testing needed not appropriate for outpatient work up, Unsafe d/c plan and Inpatient level of care appropriate due to severity of illness   Dispo: The patient is from: Home  Anticipated d/c is to:  SNF              Anticipated d/c date IH:KVQQ bed available              Patient currently is medically stable  Consultants:   Nephrology  Orthopedic Surgery, Dr Mardelle Matte    Subjective: No overnight events  Objective: Vitals:   04/25/20 1741 04/25/20 2354 04/26/20 0015 04/26/20 0609  BP: (!) 122/45 (!) 120/54  (!) 124/52  Pulse: 81 75 72 77  Resp: 16 17 18 16   Temp: 99.1 F (37.3 C) 98.4 F (36.9 C)  97.8 F (36.6 C)  TempSrc: Oral Oral  Oral  SpO2: 95% 93% 94% 91%  Weight:      Height:        Intake/Output Summary (Last 24 hours) at 04/26/2020 1112 Last data filed at 04/26/2020 0300 Gross per 24 hour  Intake 240 ml  Output --  Net 240 ml   Filed Weights   04/24/20 1238 04/24/20 2104 04/25/20 0115  Weight: 71.2 kg 82 kg 80.5 kg    Examination:  General: Appearance:    Obese female in no acute distress     Lungs:     Clear to auscultation bilaterally, respirations unlabored, on 2L [place]  Heart:    Normal heart rate.  No murmurs, rubs, or gallops.   MS:   All extremities are intact.   Neurologic:   Awake, alert, oriented x 3. No apparent focal neurological           defect.     Data Reviewed: I have personally reviewed following labs and imaging studies  CBC: Recent Labs  Lab 04/24/20 1312 04/25/20 0226 04/26/20 0514  WBC 9.3 7.4 6.4  NEUTROABS 7.5  --   --   HGB 10.1* 9.1* 10.0*  HCT 32.9* 29.3* 31.8*  MCV 97.6 94.8 95.5  PLT 302 247 595   Basic Metabolic Panel: Recent Labs  Lab 04/24/20 1312 04/25/20 0226 04/26/20 0514  NA 139 138 136  K 6.1* 3.6 5.4*  CL 99 99 98  CO2 25 27 24   GLUCOSE 134* 111* 152*  BUN 34* 8 23  CREATININE 7.54* 2.74* 5.20*  CALCIUM 8.5* 8.2* 8.5*   GFR: Estimated Creatinine Clearance: 8 mL/min (A) (by C-G formula based on SCr of 5.2 mg/dL (H)). Liver Function Tests: Recent Labs  Lab 04/24/20 1312  AST 14*  ALT 9  ALKPHOS 74  BILITOT 0.8  PROT 6.4*  ALBUMIN 2.8*   No results for input(s): LIPASE, AMYLASE in  the last 168 hours. No results for input(s): AMMONIA in the last 168 hours. Coagulation Profile: Recent Labs  Lab 04/24/20 1312  INR 1.5*   Cardiac Enzymes: No results for input(s): CKTOTAL, CKMB, CKMBINDEX, TROPONINI in the last 168 hours. BNP (last 3 results) No results for input(s): PROBNP in the last 8760 hours. HbA1C: Recent Labs    04/24/20 1844  HGBA1C 6.0*   CBG: Recent Labs  Lab 04/25/20 1107 04/25/20 1639 04/25/20 2113 04/26/20 0610 04/26/20 1104  GLUCAP 109* 118* 153* 138* 171*   Lipid Profile: No results for input(s): CHOL, HDL, LDLCALC, TRIG, CHOLHDL, LDLDIRECT in the last 72 hours. Thyroid Function Tests: No results for input(s): TSH, T4TOTAL, FREET4, T3FREE, THYROIDAB in the last 72 hours. Anemia Panel: No results for input(s): VITAMINB12, FOLATE, FERRITIN, TIBC, IRON, RETICCTPCT in the last 72 hours. Sepsis Labs: Recent Labs  Lab 04/24/20 1312 04/24/20 1844  PROCALCITON  --  0.48  LATICACIDVEN 1.5  --     Recent Results (from the past 240 hour(s))  Blood culture (routine single)     Status: None (Preliminary result)   Collection Time: 04/24/20  1:00 PM   Specimen: BLOOD RIGHT FOREARM  Result Value Ref Range Status   Specimen Description BLOOD RIGHT FOREARM  Final   Special Requests   Final    BOTTLES DRAWN AEROBIC AND ANAEROBIC Blood Culture adequate volume   Culture   Final    NO GROWTH < 24 HOURS Performed at [facility], [place] [address] [place], [place] [address]    Report Status PENDING  Incomplete  SARS Coronavirus 2 by RT PCR (hospital order, performed in [facility] hospital lab) Nasopharyngeal Nasopharyngeal Swab     Status: None   Collection Time: 04/24/20  1:00 PM   Specimen: Nasopharyngeal Swab  Result Value Ref Range Status   SARS Coronavirus 2 NEGATIVE NEGATIVE Final    Comment: (NOTE) SARS-CoV-2 target nucleic acids are NOT DETECTED.  The SARS-CoV-2 RNA is generally detectable in upper and lower respiratory  specimens during the acute phase of infection. The lowest concentration of SARS-CoV-2 viral copies this assay can detect is 250 copies / mL. A negative result does not preclude SARS-CoV-2 infection and should not be used as the sole basis for treatment or other patient management decisions.  A negative result may occur with improper specimen collection / handling, submission of specimen other than nasopharyngeal swab, presence of viral mutation(s) within the areas targeted by this assay, and inadequate number of viral copies (<250 copies / mL). A negative result must be combined with clinical observations, patient history, and epidemiological information.  Fact Sheet for Patients:   StrictlyIdeas.no  Fact Sheet for Healthcare Providers: BankingDealers.co.za  This test is not yet approved or  cleared by the [place] FDA and has been authorized for detection and/or diagnosis of SARS-CoV-2 by FDA under an Emergency Use Authorization (EUA).  This EUA will remain in effect (meaning this test can be used) for the duration of the COVID-19 declaration under Section 564(b)(1) of the Act, 21 U.S.C. section 360bbb-3(b)(1), unless the authorization is terminated or revoked sooner.  Performed at [facility], [place] [address] [place], [place] [address]   Urine culture     Status: None   Collection Time: 04/24/20  3:32 PM   Specimen: Urine, Random  Result Value Ref Range Status   Specimen Description URINE, RANDOM  Final   Special Requests NONE  Final   Culture   Final    NO GROWTH Performed at [facility], [place] [address] [place], [place] [address]    Report Status 04/25/2020 FINAL  Final  Body fluid culture     Status: None (Preliminary result)   Collection Time: 04/25/20  1:54 PM   Specimen: Synovium; Body Fluid  Result Value Ref Range Status   Specimen Description SYNOVIAL  Final   Special Requests Normal  Final   Gram  Stain   Final    ABUNDANT WBC PRESENT,BOTH PMN AND MONONUCLEAR NO ORGANISMS SEEN    Culture   Final    NO GROWTH < 24 HOURS Performed at [facility], [place] [address] [place], [place] [address]    Report Status PENDING  Incomplete         Radiology Studies: CT ABDOMEN PELVIS WO CONTRAST  Result Date: 04/24/2020 CLINICAL DATA:  Abdominal trauma after fall.  Flank and lumbar pain EXAM: CT  ABDOMEN AND PELVIS WITHOUT CONTRAST TECHNIQUE: Multidetector CT imaging of the abdomen and pelvis was performed following the standard protocol without IV contrast. COMPARISON:  CT 07/17/2019 FINDINGS: Lower chest: Bibasilar atelectasis, right worse than left. 4 mm right middle lobe pulmonary nodule and 3 mm right lower lobe pulmonary nodule (series 5, images 11 and 27). These findings are both stable from the prior study. Stable mild cardiomegaly. Large hiatal hernia. Hepatobiliary: No focal liver abnormality is identified on noncontrast imaging. Prior cholecystectomy. No biliary dilatation. Pancreas: Grossly unremarkable. Spleen: Normal in size without focal abnormality. Adrenals/Urinary Tract: Unremarkable adrenal glands. Bilateral kidneys are mildly atrophic with chronic mild perinephric stranding, nonspecific. No renal stone or hydronephrosis. Urinary bladder is unremarkable for the degree of distension. Stomach/Bowel: Again noted is a large hiatal hernia with the majority of the stomach located within the chest. No evidence of outlet obstruction. No dilated loops of bowel. Extensive diverticulosis throughout the colon. No focal colonic thickening or pericolonic inflammatory changes. A normal appendix is present within the right lower quadrant (series 3, image 66). Vascular/Lymphatic: Extensive atherosclerosis. No aneurysm. No abdominopelvic lymphadenopathy. Reproductive: Status post hysterectomy. No adnexal masses. Other: No free fluid. No abdominopelvic fluid collection. No pneumoperitoneum. No  abdominal wall hernia. Musculoskeletal: Healed fracture of the lateral aspect of the left eighth rib. Prior fusion of L4-5. No acute osseous findings. No soft tissue fluid collection or hematoma. IMPRESSION: 1. No acute findings in the abdomen or pelvis. 2. Large hiatal hernia. 3. Extensive colonic diverticulosis without evidence of acute diverticulitis. 4. Aortic atherosclerosis. (ICD10-I70.0). Electronically Signed   By: Davina Poke D.O.   On: 04/24/2020 13:43   DG Tibia/Fibula Left  Result Date: 04/24/2020 CLINICAL DATA:  [place].  Left leg pain. EXAM: LEFT TIBIA AND FIBULA - 2 VIEW COMPARISON:  None. FINDINGS: Knee and ankle joint degenerative changes are noted. No acute fracture of the tibia or fibula is identified. Extensive vascular calcifications are noted. IMPRESSION: 1. No acute bony findings. 2. Extensive vascular calcifications. Electronically Signed   By: Marijo Sanes M.D.   On: 04/24/2020 14:12   CT Head Wo Contrast  Addendum Date: 04/24/2020   ADDENDUM REPORT: 04/24/2020 13:35 ADDENDUM: Right mastoid effusion noted. Electronically Signed   By: Rolm Baptise M.D.   On: 04/24/2020 13:35   Result Date: 04/24/2020 CLINICAL DATA:  Fall on blood thinners EXAM: CT HEAD WITHOUT CONTRAST TECHNIQUE: Contiguous axial images were obtained from the base of the skull through the vertex without intravenous contrast. COMPARISON:  04/07/2020 FINDINGS: Brain: There is atrophy and chronic small vessel disease changes. No acute intracranial abnormality. Specifically, no hemorrhage, hydrocephalus, mass lesion, acute infarction, or significant intracranial injury. Vascular: No hyperdense vessel or unexpected calcification. Skull: No acute calvarial abnormality. Sinuses/Orbits: Visualized paranasal sinuses and mastoids clear. Orbital soft tissues unremarkable. Other: None IMPRESSION: Atrophy, chronic microvascular disease. No acute intracranial abnormality. Electronically Signed: By: Rolm Baptise M.D. On:  04/24/2020 13:32   CT Cervical Spine Wo Contrast  Result Date: 04/24/2020 CLINICAL DATA:  Fall, neck trauma EXAM: CT CERVICAL SPINE WITHOUT CONTRAST TECHNIQUE: Multidetector CT imaging of the cervical spine was performed without intravenous contrast. Multiplanar CT image reconstructions were also generated. COMPARISON:  04/07/2020 FINDINGS: Alignment: Normal Skull base and vertebrae: No acute fracture. No primary bone lesion or focal pathologic process. Soft tissues and spinal canal: No prevertebral fluid or swelling. No visible canal hematoma. Disc levels: Diffuse degenerative disc disease with disc space narrowing and spurring. Diffuse degenerative facet disease bilaterally. Upper chest: No acute  findings Other: Fluid seen within the right mastoid air cells. IMPRESSION: Diffuse degenerative disc and facet disease. No acute bony abnormality. Right mastoid effusion. Electronically Signed   By: Rolm Baptise M.D.   On: 04/24/2020 13:34   DG Pelvis Portable  Result Date: 04/24/2020 CLINICAL DATA:  [place].  Pelvic pain. EXAM: PORTABLE PELVIS 1-2 VIEWS COMPARISON:  None. FINDINGS: Both hips are normally located. No definite hip or pelvic fractures. The pubic symphysis and SI joints are maintained. Extensive vascular calcifications. IMPRESSION: No acute bony findings. Electronically Signed   By: Marijo Sanes M.D.   On: 04/24/2020 14:11   DG Chest Port 1 View  Result Date: 04/24/2020 CLINICAL DATA:  [place]. EXAM: PORTABLE CHEST 1 VIEW COMPARISON:  04/07/2020 FINDINGS: The heart is enlarged but stable. Stable right IJ Port-A-Cath and left IJ dialysis catheter. No infiltrates, edema or effusions. No pneumothorax. The bony thorax is intact. IMPRESSION: No acute cardiopulmonary findings. Electronically Signed   By: Marijo Sanes M.D.   On: 04/24/2020 14:13   DG Knee Complete 4 Views Left  Result Date: 04/25/2020 CLINICAL DATA:  Fall yesterday with left knee pain EXAM: LEFT KNEE - COMPLETE 4+ VIEW COMPARISON:   10/01/2017 FINDINGS: Joint effusion without acute fracture or subluxation. Tricompartmental osteoarthritis with lateral translation of the tibia and degenerative spurring primarily at the medial and patellofemoral compartments. Extensive arterial calcification. IMPRESSION: 1. Joint effusion without acute fracture. 2. Tricompartmental osteoarthritis. Electronically Signed   By: Monte Fantasia M.D.   On: 04/25/2020 10:15   DG Femur Min 2 Views Left  Result Date: 04/24/2020 CLINICAL DATA:  [place].  Left femur pain. EXAM: LEFT FEMUR 2 VIEWS COMPARISON:  None. FINDINGS: The hip and knee joints are maintained. Moderate degenerative changes. No acute hip or femur fractures are identified. Extensive vascular calcifications. IMPRESSION: 1. No acute bony findings. 2. Moderate hip and knee joint degenerative changes. Electronically Signed   By: Marijo Sanes M.D.   On: 04/24/2020 14:10        Scheduled Meds: . allopurinol  100 mg Oral BID  . amiodarone  200 mg Oral Daily  . aspirin EC  81 mg Oral Daily  . atorvastatin  40 mg Oral QHS  . bupivacaine  10 mL Infiltration Once  . calcitRIOL  1.75 mcg Oral Q M,W,F-HD  . Chlorhexidine Gluconate Cloth  6 each Topical Q0600  . diclofenac Sodium  2 g Topical QID  . famotidine  10 mg Oral QHS  . ferric citrate  420 mg Oral TID WC  . gabapentin  100 mg Oral QHS  . insulin aspart  0-6 Units Subcutaneous TID WC  . methylPREDNISolone acetate  40 mg Intra-articular Once  . midodrine  2.5 mg Oral Q M,W,F-HD  . pantoprazole  40 mg Oral Daily  . sodium chloride flush  3 mL Intravenous Q12H  . tamsulosin  0.4 mg Oral Daily   Continuous Infusions:   LOS: 1 day    Time spent: 38 minutes spent on chart review, discussion with nursing staff, consultants, updating family and interview/physical exam;    Geradine Girt, DO [facility] Available via Epic secure chat 7am-7pm After these hours, please refer to coverage provider listed on  [email or web address] 04/26/2020, 11:12 AM

## 2020-04-26 NOTE — Progress Notes (Signed)
°   04/26/20 1945  Vital Signs  Temp 97.7 F (36.5 C)  Temp Source Oral  Pulse Rate 82  Resp (!) 21  BP (!) 106/51  BP Location Right Arm  BP Method Automatic  Patient Position (if appropriate) Lying  Oxygen Therapy  SpO2 99 %  O2 Device Room Air  Dialysis Weight  Weight 78.6 kg  Type of Weight Post-Dialysis  During Hemodialysis Assessment  Intra-Hemodialysis Comments Tx completed  Post-Hemodialysis Assessment  Rinseback Volume (mL) 250 mL  KECN 202 V  Dialyzer Clearance Lightly streaked  Duration of HD Treatment -hour(s) 3.5 hour(s)  Hemodialysis Intake (mL) 500 mL  UF Total -Machine (mL) 2800 mL  Net UF (mL) 2300 mL  Tolerated HD Treatment Yes  Post-Hemodialysis Comments tx complete-pt stable  AVG/AVF Arterial Site Held (minutes) 10 minutes  AVG/AVF Venous Site Held (minutes) 10 minutes  Fistula / Graft Left Upper arm Arteriovenous fistula  No placement date or time found.   Placed prior to admission: Yes  Orientation: Left  Access Location: Upper arm  Access Type: Arteriovenous fistula  Site Condition  (bruised)  Fistula / Graft Assessment Present;[place];Bruit  Status Flushed;Deaccessed

## 2020-04-27 LAB — GLUCOSE, CAPILLARY
Glucose-Capillary: 106 mg/dL — ABNORMAL HIGH (ref 70–99)
Glucose-Capillary: 120 mg/dL — ABNORMAL HIGH (ref 70–99)
Glucose-Capillary: 104 mg/dL — ABNORMAL HIGH (ref 70–99)
Glucose-Capillary: 129 mg/dL — ABNORMAL HIGH (ref 70–99)
Glucose-Capillary: 121 mg/dL — ABNORMAL HIGH (ref 70–99)

## 2020-04-27 MED ORDER — ALTEPLASE 2 MG IJ SOLR
2.0000 mg | Freq: Once | INTRAMUSCULAR | Status: DC | PRN
  Filled 2020-04-27: qty 2

## 2020-04-27 MED ORDER — LIDOCAINE HCL (PF) 1 % IJ SOLN: 5.0000 mL | INTRAMUSCULAR | Status: DC | PRN

## 2020-04-27 MED ORDER — TRAMADOL HCL 50 MG PO TABS
50.0000 mg | ORAL_TABLET | Freq: Three times a day (TID) | ORAL | 0 refills | Status: DC | PRN
Start: 2020-04-27 — End: 2020-09-08

## 2020-04-27 MED ORDER — PENTAFLUOROPROP-TETRAFLUOROETH EX AERO: 1.0000 "application " | INHALATION_SPRAY | CUTANEOUS | Status: DC | PRN

## 2020-04-27 MED ORDER — SODIUM CHLORIDE 0.9 % IV SOLN: 100.0000 mL | INTRAVENOUS | Status: DC | PRN

## 2020-04-27 MED ORDER — DICLOFENAC SODIUM 1 % EX GEL: 2.0000 g | Freq: Four times a day (QID) | CUTANEOUS | Status: DC

## 2020-04-27 MED ORDER — LIDOCAINE-PRILOCAINE 2.5-2.5 % EX CREA: 1.0000 "application " | TOPICAL_CREAM | CUTANEOUS | Status: DC | PRN

## 2020-04-27 MED ORDER — HEPARIN SODIUM (PORCINE) 1000 UNIT/ML DIALYSIS
1000.0000 [IU] | INTRAMUSCULAR | Status: DC | PRN
  Filled 2020-04-27: qty 1

## 2020-04-27 MED ORDER — MIDODRINE HCL 5 MG PO TABS
10.0000 mg | ORAL_TABLET | Freq: Three times a day (TID) | ORAL | Status: DC
  Administered 2020-04-27 – 2020-04-28 (×3): 10 mg via ORAL
  Filled 2020-04-27 (×4): qty 2

## 2020-04-27 NOTE — Care Management Important Message (Signed)
Important Message  Patient Details  Name: [name] MRN: [id number] Date of Birth: 1946/07/16   Medicare Important Message Given:  Yes - Important Message mailed due to current National Emergency  Verbal consent obtained due to current National Emergency  Relationship to patient: Self Contact Name: Rogue Pautler Call Date: 04/27/20  Time: 0950 Phone: [phone] Outcome: No Answer/Busy Important Message mailed to: Patient address on file    Delorse Lek 04/27/2020, 9:50 AM

## 2020-04-27 NOTE — Progress Notes (Signed)
Pt refusing cpap for the night. ?

## 2020-04-27 NOTE — Progress Notes (Signed)
Physical Therapy Treatment Patient Details Name: [name] MRN: [id number] DOB: 1945-08-15 Today's Date: 04/27/2020    History of Present Illness Patient is a 74 y/o female who presents with left knee pain s/p fall. XRAY of left knee planned 04/25/20- imaging with no acute fx. PMH includes PAF, OSA, HTN, DM, ESRD on HD, COPD, obesity.    PT Comments    The pt is making slow but steady progress with therapy at this time. She was able to increase ambulation today with therapy, but remained significantly limited in speed, fluidity, and distance due to pain and weakness in her LLE. The pt will continue to benefit from skilled PT to progress functional strength, activity, tolerance, stability, and power to improve the pt's level of function and activity to reduce risk of falls and fall-related injury following d/c. The pt continues to be a good candidate for continued rehab prior to return home to meet her goals for mobility and activity with reduced pain.    Follow Up Recommendations  SNF;Supervision for mobility/OOB     Equipment Recommendations  None recommended by PT (defer to post acute)    Recommendations for Other Services       Precautions / Restrictions Precautions Precautions: Fall Restrictions Weight Bearing Restrictions: No    Mobility  Bed Mobility Overal bed mobility: Needs Assistance Bed Mobility: Supine to Sit;Sit to Supine     Supine to sit: Supervision;HOB elevated Sit to supine: Supervision   General bed mobility comments: increased time and effort, but no physical assist required  Transfers Overall transfer level: Needs assistance Equipment used: Rolling walker (2 wheeled) Transfers: Sit to/from Stand Sit to Stand: Min guard         General transfer comment: minG with signigifant extra time and effort. slow to rise, cues for hand placement each sit-stand (x3 through session)  Ambulation/Gait Ambulation/Gait assistance: Min guard Gait  Distance (Feet): 20 Feet Assistive device: Rolling walker (2 wheeled) Gait Pattern/deviations: Step-to pattern;Decreased stride length;Trunk flexed;Decreased stance time - left Gait velocity: decreased Gait velocity interpretation: <1.31 ft/sec, indicative of household ambulator General Gait Details: significant trunk flexion with heavy reliance on BUE and small short steps with reduced stance time on LLE.      Balance Overall balance assessment: History of Falls;Needs assistance Sitting-balance support: Feet supported;Single extremity supported Sitting balance-Leahy Scale: Fair Sitting balance - Comments: supervision for safety.   Standing balance support: During functional activity;Bilateral upper extremity supported Standing balance-Leahy Scale: Poor Standing balance comment: Requires UE supoprt and external support; not placing much weight through LLE due to pain.                            Cognition Arousal/Alertness: Awake/alert Behavior During Therapy: Flat affect Overall Cognitive Status: Impaired/Different from baseline Area of Impairment: Problem solving                             Problem Solving: Slow processing;Requires verbal cues General Comments: patient with flat affect, slow processing and requires verbal cues. Anticipate near baseline.       Exercises General Exercises - Lower Extremity Ankle Circles/Pumps: AROM;Both;10 reps;Supine Heel Slides: AROM;Both;10 reps;Supine Hip ABduction/ADduction: AROM;Both;10 reps;Supine    General Comments General comments (skin integrity, edema, etc.): VSS on RA.      Pertinent Vitals/Pain Pain Assessment: Faces Faces Pain Scale: Hurts little more Pain Location: left knee Pain Descriptors / Indicators: Sore;Sharp;Constant;Grimacing;Guarding  Pain Intervention(s): Limited activity within patient's tolerance;Monitored during session           PT Goals (current goals can now be found in the care  plan section) Acute Rehab PT Goals Patient Stated Goal: improve pain PT Goal Formulation: With patient Time For Goal Achievement: 05/09/20 Potential to Achieve Goals: Fair Progress towards PT goals: Progressing toward goals    Frequency    Min 2X/week      PT Plan Current plan remains appropriate       AM-PAC PT "6 Clicks" Mobility   Outcome Measure  Help needed turning from your back to your side while in a flat bed without using bedrails?: None Help needed moving from lying on your back to sitting on the side of a flat bed without using bedrails?: None Help needed moving to and from a bed to a chair (including a wheelchair)?: A Lot Help needed standing up from a chair using your arms (e.g., wheelchair or bedside chair)?: A Little Help needed to walk in hospital room?: A Lot Help needed climbing 3-5 steps with a railing? : A Lot 6 Click Score: 17    End of Session Equipment Utilized During Treatment: Gait belt Activity Tolerance: Patient limited by pain Patient left: in bed;with call bell/phone within reach;with bed alarm set Nurse Communication: Mobility status PT Visit Diagnosis: Pain;Difficulty in walking, not elsewhere classified (R26.2);Muscle weakness (generalized) (M62.81) Pain - Right/Left: Left Pain - part of body: Knee     Time: 1201-1240 PT Time Calculation (min) (ACUTE ONLY): 39 min  Charges:  $Gait Training: 23-37 mins $Therapeutic Exercise: 8-22 mins                     Karma Ganja, PT, DPT   Acute Rehabilitation Department Pager #: [phone]   Otho Bellows 04/27/2020, 12:59 PM

## 2020-04-27 NOTE — TOC Progression Note (Signed)
Transition of Care [facility]) - Progression Note    Patient Details  Name: [name] MRN: [id number] Date of Birth: Jul 19, 1946  Transition of Care [facility]) CM/SW [place], [place] Phone Number: 04/27/2020, 1:53 PM  Clinical Narrative:    Chair has been switched to [facility], pt plan for d/c to [facility] tomorrow after dialysis. Pt PTAR papers have been placed on chart, call made to Opp at [facility] with update and CSW will follow for d/c on 9/24.    Expected Discharge Plan: [place] Barriers to Discharge: Continued Medical Work up  Expected Discharge Plan and Services Expected Discharge Plan: [place] In-house Referral: Clinical Social Work Discharge Planning Services: CM Consult, DC out of service area (changing OP dialysis) Post Acute Care Choice: [place] arrangements for the past 2 months: Single Family Home  Readmission Risk Interventions No flowsheet data found.

## 2020-04-27 NOTE — Progress Notes (Signed)
PROGRESS NOTE    [name]  BOF:[id number] DOB: August 29, 1945 DOA: 04/24/2020 PCP: [name], MD    Brief Narrative:  [name] is a 74 year old female with past medical history notable for paroxysmal atrial fibrillation on Eliquis, OSA, essential hypertension, type 2 diabetes mellitus, ESRD on HD, COPD, morbid obesity who presented to the emergency department following fall.  Patient reports multiple falls over the past several months, unclear to etiology.  Patient states sometimes she remembers falling and sometimes she has a hard time recalling events leading to these falls.  She utilizes a walker at baseline.  Patient complains of left knee pain following this fall, and now unable to ambulate.     Assessment & Plan:   Principal Problem:   Fall Active Problems:   ESRD (end stage renal disease) on dialysis ([name])   Essential hypertension   Dyslipidemia   DM (diabetes mellitus), type 2 with renal complications (HCC)   Class 2 obesity with body mass index (BMI) of 35 to 39.9 without comorbidity   OSA (obstructive sleep apnea)   COPD (chronic obstructive pulmonary disease) (HCC)   PAF (paroxysmal atrial fibrillation) ([place])   Recurrent falls   Recurrent falls with left knee pain Patient presenting to the ED following recurrent fall.  Patient reports multiple falls over the past several months, some reported as mechanical and some unclear etiology as she has a hard time recalling events.  At baseline she utilizes a walker for ambulation and lives with her son at home.  CT head/C-spine, left femur x-ray, pelvis x-ray, left hip/fib x-ray, CT abdomen/pelvis unrevealing for acute findings.  Left knee x-ray done this morning notable for a left knee effusion.  --Consulted orthopedics, Dr. Mardelle Matte for knee evaluation: s/p injection/arthrocentesis: consistent with her degenerative arthritic condition, and inflammatory arthropathy --Continue PT/OT efforts; currently  recommending SNF placement -improvement with voltaren gel  Fever, of unclear etiology Patient with a temperature of 100.8 on admission.  WBC count within normal limits.  Urinalysis unrevealing.  Chest x-ray with no acute cardiopulmonary findings. --No indication for antibiotic therapy at this time-- resolved  Hyperkalemia:  -per HD  ESRD on HD Patient dialyzes on a Monday/Wednesday/Friday schedule at [facility].  Underwent HD last night/early this morning. --Nephrology following, appreciate assistance --Continue HD per nephrology  Hyperlipidemia: - Continue atorvastatin  Hx essential hypertension Patient not on any antihypertensives secondary to now borderline low blood pressures especially with hemodialysis. --Continue aspirin and statin --Monitor blood pressure closely --If episodes of hypotension with HD, may need to consider scheduled midodrine prior to HD sessions  Type 2 diabetes mellitus Currently diet controlled at home.  Hemoglobin A1c 6.0, well controlled.  Chronic hypoxic respiratory failure with COPD/OSA, not in acute exacerbation On 2 L nasal cannula at baseline. --Continue Trelegy Ellipta inhaler --Albuterol nebs as needed --CPAP nightly  Paroxysmal atrial fibrillation --Amiodarone 200 mg p.o. daily --resume eliquis  GERD: Continue Pepcid  Morbid obesity Body mass index is 38.85 kg/m.  Discussed with patient needs for aggressive weight loss/lifestyle changes as this complicates all facets of care.   DVT prophylaxis: eliquis Code Status: Full code   Disposition Plan:  Status is: Inpatient  Remains inpatient appropriate because:Ongoing active pain requiring inpatient pain management, Ongoing diagnostic testing needed not appropriate for outpatient work up, Unsafe d/c plan and Inpatient level of care appropriate due to severity of illness   Dispo: The patient is from: Home  Anticipated d/c is to: SNF              Anticipated d/c date  KG:YJEH bed available- plan for tomm after AM HD              Patient currently is medically stable  Consultants:   Nephrology  Orthopedic Surgery, Dr Mardelle Matte    Subjective: Pain in knee is better with voltaren gel  Objective: Vitals:   04/26/20 1930 04/26/20 1945 04/26/20 2335 04/27/20 0428  BP: (!) 114/50 (!) 106/51  (!) 114/56  Pulse: 79 82 80 78  Resp: 16 (!) 21 16 15   Temp:  97.7 F (36.5 C)  98 F (36.7 C)  TempSrc:  Oral  Oral  SpO2:  99% 97% 95%  Weight:  78.6 kg    Height:        Intake/Output Summary (Last 24 hours) at 04/27/2020 1120 Last data filed at 04/27/2020 0800 Gross per 24 hour  Intake 480 ml  Output 2300 ml  Net -1820 ml   Filed Weights   04/25/20 0115 04/26/20 1610 04/26/20 1945  Weight: 80.5 kg 81.1 kg 78.6 kg    Examination:  General: Appearance:    Obese female in no acute distress     Lungs:     Clear to auscultation bilaterally, respirations unlabored     MS:   All extremities are intact.   Neurologic:   Awake, alert    Data Reviewed: I have personally reviewed following labs and imaging studies  CBC: Recent Labs  Lab 04/24/20 1312 04/25/20 0226 04/26/20 0514  WBC 9.3 7.4 6.4  NEUTROABS 7.5  --   --   HGB 10.1* 9.1* 10.0*  HCT 32.9* 29.3* 31.8*  MCV 97.6 94.8 95.5  PLT 302 247 631   Basic Metabolic Panel: Recent Labs  Lab 04/24/20 1312 04/25/20 0226 04/26/20 0514  NA 139 138 136  K 6.1* 3.6 5.4*  CL 99 99 98  CO2 25 27 24   GLUCOSE 134* 111* 152*  BUN 34* 8 23  CREATININE 7.54* 2.74* 5.20*  CALCIUM 8.5* 8.2* 8.5*   GFR: Estimated Creatinine Clearance: 7.9 mL/min (A) (by C-G formula based on SCr of 5.2 mg/dL (H)). Liver Function Tests: Recent Labs  Lab 04/24/20 1312  AST 14*  ALT 9  ALKPHOS 74  BILITOT 0.8  PROT 6.4*  ALBUMIN 2.8*   No results for input(s): LIPASE, AMYLASE in the last 168 hours. No results for input(s): AMMONIA in the last 168 hours. Coagulation Profile: Recent Labs  Lab  04/24/20 1312  INR 1.5*   Cardiac Enzymes: No results for input(s): CKTOTAL, CKMB, CKMBINDEX, TROPONINI in the last 168 hours. BNP (last 3 results) No results for input(s): PROBNP in the last 8760 hours. HbA1C: Recent Labs    04/24/20 1844  HGBA1C 6.0*   CBG: Recent Labs  Lab 04/26/20 1104 04/26/20 1547 04/26/20 2034 04/27/20 0619 04/27/20 0929  GLUCAP 171* 146* 109* 106* 120*   Lipid Profile: No results for input(s): CHOL, HDL, LDLCALC, TRIG, CHOLHDL, LDLDIRECT in the last 72 hours. Thyroid Function Tests: No results for input(s): TSH, T4TOTAL, FREET4, T3FREE, THYROIDAB in the last 72 hours. Anemia Panel: No results for input(s): VITAMINB12, FOLATE, FERRITIN, TIBC, IRON, RETICCTPCT in the last 72 hours. Sepsis Labs: Recent Labs  Lab 04/24/20 1312 04/24/20 1844  PROCALCITON  --  0.48  LATICACIDVEN 1.5  --     Recent Results (from the past 240 hour(s))  Blood culture (routine single)  Status: None (Preliminary result)   Collection Time: 04/24/20  1:00 PM   Specimen: BLOOD RIGHT FOREARM  Result Value Ref Range Status   Specimen Description BLOOD RIGHT FOREARM  Final   Special Requests   Final    BOTTLES DRAWN AEROBIC AND ANAEROBIC Blood Culture adequate volume   Culture   Final    NO GROWTH 2 DAYS Performed at [facility], [address], [place], [place] [address]    Report Status PENDING  Incomplete  SARS Coronavirus 2 by RT PCR (hospital order, performed in [facility] hospital lab) Nasopharyngeal Nasopharyngeal Swab     Status: None   Collection Time: 04/24/20  1:00 PM   Specimen: Nasopharyngeal Swab  Result Value Ref Range Status   SARS Coronavirus 2 NEGATIVE NEGATIVE Final    Comment: (NOTE) SARS-CoV-2 target nucleic acids are NOT DETECTED.  The SARS-CoV-2 RNA is generally detectable in upper and lower respiratory specimens during the acute phase of infection. The lowest concentration of SARS-CoV-2 viral copies this assay can detect is  250 copies / mL. A negative result does not preclude SARS-CoV-2 infection and should not be used as the sole basis for treatment or other patient management decisions.  A negative result may occur with improper specimen collection / handling, submission of specimen other than nasopharyngeal swab, presence of viral mutation(s) within the areas targeted by this assay, and inadequate number of viral copies (<250 copies / mL). A negative result must be combined with clinical observations, patient history, and epidemiological information.  Fact Sheet for Patients:   StrictlyIdeas.no  Fact Sheet for Healthcare Providers: BankingDealers.co.za  This test is not yet approved or  cleared by the [place] FDA and has been authorized for detection and/or diagnosis of SARS-CoV-2 by FDA under an Emergency Use Authorization (EUA).  This EUA will remain in effect (meaning this test can be used) for the duration of the COVID-19 declaration under Section 564(b)(1) of the Act, 21 U.S.C. section 360bbb-3(b)(1), unless the authorization is terminated or revoked sooner.  Performed at [facility], [place] [address] [place], [place] [address]   Urine culture     Status: None   Collection Time: 04/24/20  3:32 PM   Specimen: Urine, Random  Result Value Ref Range Status   Specimen Description URINE, RANDOM  Final   Special Requests NONE  Final   Culture   Final    NO GROWTH Performed at [facility], [place] [address] [place], [place] [address]    Report Status 04/25/2020 FINAL  Final  Body fluid culture     Status: None (Preliminary result)   Collection Time: 04/25/20  1:54 PM   Specimen: Synovium; Body Fluid  Result Value Ref Range Status   Specimen Description SYNOVIAL  Final   Special Requests Normal  Final   Gram Stain   Final    ABUNDANT WBC PRESENT,BOTH PMN AND MONONUCLEAR NO ORGANISMS SEEN    Culture   Final    NO GROWTH 2  DAYS Performed at [facility], [address], [place], [place] [address]    Report Status PENDING  Incomplete  SARS Coronavirus 2 by RT PCR (hospital order, performed in [facility] hospital lab) Nasopharyngeal Nasopharyngeal Swab     Status: None   Collection Time: 04/26/20  3:41 PM   Specimen: Nasopharyngeal Swab  Result Value Ref Range Status   SARS Coronavirus 2 NEGATIVE NEGATIVE Final    Comment: (NOTE) SARS-CoV-2 target nucleic  acids are NOT DETECTED.  The SARS-CoV-2 RNA is generally detectable in upper and lower respiratory specimens during the acute phase of infection. The lowest concentration of SARS-CoV-2 viral copies this assay can detect is 250 copies / mL. A negative result does not preclude SARS-CoV-2 infection and should not be used as the sole basis for treatment or other patient management decisions.  A negative result may occur with improper specimen collection / handling, submission of specimen other than nasopharyngeal swab, presence of viral mutation(s) within the areas targeted by this assay, and inadequate number of viral copies (<250 copies / mL). A negative result must be combined with clinical observations, patient history, and epidemiological information.  Fact Sheet for Patients:   StrictlyIdeas.no  Fact Sheet for Healthcare Providers: BankingDealers.co.za  This test is not yet approved or  cleared by the [place] FDA and has been authorized for detection and/or diagnosis of SARS-CoV-2 by FDA under an Emergency Use Authorization (EUA).  This EUA will remain in effect (meaning this test can be used) for the duration of the COVID-19 declaration under Section 564(b)(1) of the Act, 21 U.S.C. section 360bbb-3(b)(1), unless the authorization is terminated or revoked sooner.  Performed at [facility], [place] [address], [place], [place] [address]          Radiology Studies: No results  found.      Scheduled Meds: . allopurinol  100 mg Oral BID  . amiodarone  200 mg Oral Daily  . apixaban  2.5 mg Oral BID  . aspirin EC  81 mg Oral Daily  . atorvastatin  40 mg Oral QHS  . bupivacaine  10 mL Infiltration Once  . calcitRIOL  1.75 mcg Oral Q M,W,F-HD  . Chlorhexidine Gluconate Cloth  6 each Topical Q0600  . diclofenac Sodium  2 g Topical QID  . famotidine  10 mg Oral QHS  . ferric citrate  420 mg Oral TID WC  . gabapentin  100 mg Oral QHS  . insulin aspart  0-6 Units Subcutaneous TID WC  . methylPREDNISolone acetate  40 mg Intra-articular Once  . midodrine  2.5 mg Oral Q M,W,F-HD  . pantoprazole  40 mg Oral Daily  . sodium chloride flush  3 mL Intravenous Q12H  . tamsulosin  0.4 mg Oral Daily   Continuous Infusions:   LOS: 2 days       Geradine Girt, DO [facility] Available via Epic secure chat 7am-7pm After these hours, please refer to coverage provider listed on [email or web address] 04/27/2020, 11:20 AM

## 2020-04-27 NOTE — Progress Notes (Signed)
Renal Navigator spoke with Clinic Manager at [facility]/J. Redmond Pulling who has been able to get a MWF afternoon patient to switch to mornings in order to open up an afternoon seat for patient while she is in rehab. Therefore, [facility] is able to accommodate patient. However, [facility] is not able to accommodate patient until Monday, 9/27. Patient will have to dialyze in the hospital on 04/28/20 and then discharge to SNF after. She will be transported by [facility] to [facility] on MWF afternoons starting Monday 9/27 with the other [facility] patients. Renal Navigator faxed hospital records to [facility] and notified patient's home clinic/[facility] transient care at [facility] while in short term rehab. Patient will return to [place] at discharge from SNF.  Renal Navigator notified CSW/I. Chasse, Attending/Dr. Eliseo Squires and Nephrology team. Navigator has requested 1st shift HD tomorrow in order to expedite discharge to SNF if possible.  Alphonzo Cruise, [place] Renal Navigator [phone]

## 2020-04-27 NOTE — Progress Notes (Signed)
[facility] Progress Note  Subjective: pt feels some better today, they are looking for SNF. She asks about orthostatic dizziness which happens to her on a regular basis. Her fall was post-HD , she was sitting outside when she passed out after HD waiting for a ride.   Vitals:   04/26/20 1945 04/26/20 2335 04/27/20 0428 04/27/20 1247  BP: (!) 106/51  (!) 114/56 135/62  Pulse: 82 80 78   Resp: (!) 21 16 15    Temp: 97.7 F (36.5 C)  98 F (36.7 C)   TempSrc: Oral  Oral   SpO2: 99% 97% 95% 95%  Weight: 78.6 kg     Height:        Exam: General appearance: tired, obese, deconditioned, no distress Resp: clear to auscultation bilaterally Cardio: regular rate and rhythm, S1, S2 normal GI: soft, non-tender; bowel sounds normal; no masses Extremities: no edema  L IJ TDC,  left arm AVF +bruit, some bruising  OP HD: MWF [facility]    4h  78kg  P4  Hep none  2/2.5 bath  TDC/ LUA AVF  Calcitriol 1.75  mircera 75 q 2 venofer 100   Last hgb 10.3, calc 8.1, phos 7.9, pth 418   Summary: 74 year old WF with many medical problems in addition to ESRD-  Presents after mechanical fall with pain and temp  Assessment/ Plan: 1. S/p fall-  Many x-rays all negative-  Conservative management/PT- admit for obs. SNF placement recommended.  2. Fever-  Cultures obtained-  U/A and cxr clear, all imaging clear as well. Blood cx's neg. Fevers resolved. Not on abx.  3. ESRD: cont HD here on MWF schedule using the [facility].  AVF seems small and bruised- unclear how much they have been using. HD Friday.  4. HypOtension/vol: regularly gets midodrine 2.5 mg pre HD. 2.3 kg off w/ HD yest. Pt concerned about orthostatic dizziness > will ^her midodrine to 10 mg tid and raise her dry wt 2kg to 80kg. Hopefully this will help.  5. Anemia of ESRD: hgb over 10-  No ESA needed at present. Resume IV Fe at dc.  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 COPD/ OSA 8. Afib on eliquis 9. Gout 10. DM2   Rob  Yuvan Medinger 04/27/2020, 3:02 PM   Recent Labs  Lab 04/25/20 0226 04/26/20 0514  K 3.6 5.4*  BUN 8 23  CREATININE 2.74* 5.20*  CALCIUM 8.2* 8.5*  HGB 9.1* 10.0*   Inpatient medications: . allopurinol  100 mg Oral BID  . amiodarone  200 mg Oral Daily  . apixaban  2.5 mg Oral BID  . aspirin EC  81 mg Oral Daily  . atorvastatin  40 mg Oral QHS  . bupivacaine  10 mL Infiltration Once  . calcitRIOL  1.75 mcg Oral Q M,W,F-HD  . Chlorhexidine Gluconate Cloth  6 each Topical Q0600  . diclofenac Sodium  2 g Topical QID  . famotidine  10 mg Oral QHS  . ferric citrate  420 mg Oral TID WC  . gabapentin  100 mg Oral QHS  . insulin aspart  0-6 Units Subcutaneous TID WC  . methylPREDNISolone acetate  40 mg Intra-articular Once  . midodrine  2.5 mg Oral Q M,W,F-HD  . pantoprazole  40 mg Oral Daily  . sodium chloride flush  3 mL Intravenous Q12H  . tamsulosin  0.4 mg Oral Daily    acetaminophen **OR** acetaminophen, albuterol, calcium carbonate (dosed in mg elemental calcium), camphor-menthol **AND** hydrOXYzine, docusate  sodium, feeding supplement (NEPRO CARB STEADY), fentaNYL (SUBLIMAZE) injection, ondansetron **OR** ondansetron (ZOFRAN) IV, polyvinyl alcohol, sorbitol, traMADol, umeclidinium bromide, zolpidem

## 2020-04-28 LAB — CBC
HCT: 33.5 % — ABNORMAL LOW (ref 36.0–46.0)
Hemoglobin: 10.2 g/dL — ABNORMAL LOW (ref 12.0–15.0)
MCH: 29.5 pg (ref 26.0–34.0)
MCHC: 30.4 g/dL (ref 30.0–36.0)
MCV: 96.8 fL (ref 80.0–100.0)
Platelets: 293 10*3/uL (ref 150–400)
RBC: 3.46 MIL/uL — ABNORMAL LOW (ref 3.87–5.11)
RDW: 14.6 % (ref 11.5–15.5)
WBC: 6.6 10*3/uL (ref 4.0–10.5)
nRBC: 0 % (ref 0.0–0.2)

## 2020-04-28 LAB — RENAL FUNCTION PANEL
Albumin: 2.6 g/dL — ABNORMAL LOW (ref 3.5–5.0)
Anion gap: 12 (ref 5–15)
BUN: 29 mg/dL — ABNORMAL HIGH (ref 8–23)
CO2: 28 mmol/L (ref 22–32)
Calcium: 8.2 mg/dL — ABNORMAL LOW (ref 8.9–10.3)
Chloride: 99 mmol/L (ref 98–111)
Creatinine, Ser: 6 mg/dL — ABNORMAL HIGH (ref 0.44–1.00)
GFR calc Af Amer: 7 mL/min — ABNORMAL LOW (ref >60)
GFR calc non Af Amer: 6 mL/min — ABNORMAL LOW (ref >60)
Glucose, Bld: 118 mg/dL — ABNORMAL HIGH (ref 70–99)
Phosphorus: 5.2 mg/dL — ABNORMAL HIGH (ref 2.5–4.6)
Potassium: 4.2 mmol/L (ref 3.5–5.1)
Sodium: 139 mmol/L (ref 135–145)

## 2020-04-28 LAB — BODY FLUID CULTURE
Culture: NO GROWTH
Special Requests: NORMAL

## 2020-04-28 LAB — GLUCOSE, CAPILLARY
Glucose-Capillary: 113 mg/dL — ABNORMAL HIGH (ref 70–99)
Glucose-Capillary: 96 mg/dL (ref 70–99)

## 2020-04-28 LAB — HEPATITIS B SURFACE ANTIBODY, QUANTITATIVE: Hep B S AB Quant (Post): 4.5 m[IU]/mL — ABNORMAL LOW (ref >9.9)

## 2020-04-28 MED ORDER — CALCITRIOL 0.5 MCG PO CAPS
ORAL_CAPSULE | ORAL | Status: AC
  Filled 2020-04-28: qty 3

## 2020-04-28 MED ORDER — MIDODRINE HCL 10 MG PO TABS: 10.0000 mg | ORAL_TABLET | Freq: Three times a day (TID) | ORAL | Status: DC

## 2020-04-28 MED ORDER — CALCITRIOL 0.25 MCG PO CAPS
ORAL_CAPSULE | ORAL | Status: AC
  Filled 2020-04-28: qty 1

## 2020-04-28 MED ORDER — HEPARIN SODIUM (PORCINE) 1000 UNIT/ML IJ SOLN
INTRAMUSCULAR | Status: AC
  Administered 2020-04-28: 1000 [IU]
  Filled 2020-04-28: qty 4

## 2020-04-28 NOTE — Progress Notes (Signed)
Attempted to call report to [facility] staff. Spoke with someone initially, transferred to room 305 hall with no answer after ringing for several minutes. Will attempt again.  [facility] has not yet arrived to pick up pt.

## 2020-04-28 NOTE — Discharge Summary (Signed)
Physician Discharge Summary  [name] FBP:[id number] DOB: 03/31/1946 DOA: 04/24/2020  PCP: [name], MD  Admit date: 04/24/2020 Discharge date: 04/28/2020  Admitted From: home Discharge disposition: SNF   Recommendations for Outpatient Follow-Up:   1. Center/CPAP   Discharge Diagnosis:   Principal Problem:   Fall Active Problems:   ESRD (end stage renal disease) on dialysis ([place])   Essential hypertension   Dyslipidemia   DM (diabetes mellitus), type 2 with renal complications (HCC)   Class 2 obesity with body mass index (BMI) of 35 to 39.9 without comorbidity   OSA (obstructive sleep apnea)   COPD (chronic obstructive pulmonary disease) (HCC)   PAF (paroxysmal atrial fibrillation) ([place])   Recurrent falls    Discharge Condition: Improved.  Diet recommendation: Low sodium, heart healthy.  Carbohydrate-modified  Wound care: None.  Code status: Full.   History of Present Illness:   [name] is a 74 y.o. female with medical history significant of ESRD on MWF HD; HTN; HLD; DM; obesity; OSA; COPD; and afib on [facility] presenting with a fall.  She reports that she was going to HD today when her left knee gave out, causing her to fall.  Denies syncope, reports that this was a mechanical fall.  She is having ongoing pain in her left knee and leg, limiting her mobility.  She denies fever at home and was generally feeling well this AM prior to the fall.   Hospital Course by Problem:   Recurrent falls with left knee pain Patient presenting to the ED following recurrent fall.  Patient reports multiple falls over the past several months, some reported as mechanical and some unclear etiology as she has a hard time recalling events.  At baseline she utilizes a walker for ambulation and lives with her son at home.  CT head/C-spine, left femur x-ray, pelvis x-ray, left hip/fib x-ray, CT abdomen/pelvis unrevealing for acute findings.  Left knee x-ray done this  morning notable for a left knee effusion.  --Consulted orthopedics, Dr. Mardelle Matte for knee evaluation: s/p injection/arthrocentesis: consistent with her degenerative arthritic condition, and inflammatory arthropathy --Continue PT/OT efforts; currently recommending SNF placement -improvement with voltaren gel  Fever, of unclear etiology -resolved  Hyperkalemia:  -per HD  ESRD on HD Patient dialyzes on a Monday/Wednesday/Friday schedule at [facility].  Underwent HD last night/early this morning. --Nephrology following, appreciate assistance --Continue HD per nephrology  Hyperlipidemia: - Continue atorvastatin  Hx essential hypertension Patient not on any antihypertensives secondary to now borderline low blood pressures especially with hemodialysis. --Continue aspirin and statin --Monitor blood pressure closely -increased midodrine  Type 2 diabetes mellitus Currently diet controlled at home.  Hemoglobin A1c 6.0, well controlled.  Chronic hypoxic respiratory failure with COPD/OSA, not in acute exacerbation On 2 L nasal cannula at baseline. --Continue Trelegy Ellipta inhaler --Albuterol nebs as needed --CPAP nightly  Paroxysmal atrial fibrillation --Amiodarone 200 mg p.o. daily --resume eliquis  GERD: Continue Pepcid  Morbid obesity Body mass index is 38.85 kg/m.  Discussed with patient needs for aggressive weight loss/lifestyle changes as this complicates all facets of care.    Medical Consultants:   Renal ortho   Discharge Exam:   Vitals:   04/28/20 1000 04/28/20 1026  BP: (!) 112/51 (!) 124/55  Pulse: 68 76  Resp:  18  Temp:  98.5 F (36.9 C)  SpO2:  95%   Vitals:   04/28/20 0900 04/28/20 0930 04/28/20 1000 04/28/20 1026  BP: (!) 121/53 Marland Kitchen)  116/51 (!) 112/51 (!) 124/55  Pulse: 71 72 68 76  Resp:    18  Temp:    98.5 F (36.9 C)  TempSrc:    Oral  SpO2:    95%  Weight:    78.3 kg  Height:        General exam: Appears calm and  comfortable.   The results of significant diagnostics from this hospitalization (including imaging, microbiology, ancillary and laboratory) are listed below for reference.     Procedures and Diagnostic Studies:   CT ABDOMEN PELVIS WO CONTRAST  Result Date: 04/24/2020 CLINICAL DATA:  Abdominal trauma after fall.  Flank and lumbar pain EXAM: CT ABDOMEN AND PELVIS WITHOUT CONTRAST TECHNIQUE: Multidetector CT imaging of the abdomen and pelvis was performed following the standard protocol without IV contrast. COMPARISON:  CT 07/17/2019 FINDINGS: Lower chest: Bibasilar atelectasis, right worse than left. 4 mm right middle lobe pulmonary nodule and 3 mm right lower lobe pulmonary nodule (series 5, images 11 and 27). These findings are both stable from the prior study. Stable mild cardiomegaly. Large hiatal hernia. Hepatobiliary: No focal liver abnormality is identified on noncontrast imaging. Prior cholecystectomy. No biliary dilatation. Pancreas: Grossly unremarkable. Spleen: Normal in size without focal abnormality. Adrenals/Urinary Tract: Unremarkable adrenal glands. Bilateral kidneys are mildly atrophic with chronic mild perinephric stranding, nonspecific. No renal stone or hydronephrosis. Urinary bladder is unremarkable for the degree of distension. Stomach/Bowel: Again noted is a large hiatal hernia with the majority of the stomach located within the chest. No evidence of outlet obstruction. No dilated loops of bowel. Extensive diverticulosis throughout the colon. No focal colonic thickening or pericolonic inflammatory changes. A normal appendix is present within the right lower quadrant (series 3, image 66). Vascular/Lymphatic: Extensive atherosclerosis. No aneurysm. No abdominopelvic lymphadenopathy. Reproductive: Status post hysterectomy. No adnexal masses. Other: No free fluid. No abdominopelvic fluid collection. No pneumoperitoneum. No abdominal wall hernia. Musculoskeletal: Healed fracture of the  lateral aspect of the left eighth rib. Prior fusion of L4-5. No acute osseous findings. No soft tissue fluid collection or hematoma. IMPRESSION: 1. No acute findings in the abdomen or pelvis. 2. Large hiatal hernia. 3. Extensive colonic diverticulosis without evidence of acute diverticulitis. 4. Aortic atherosclerosis. (ICD10-I70.0). Electronically Signed   By: Davina Poke D.O.   On: 04/24/2020 13:43   DG Tibia/Fibula Left  Result Date: 04/24/2020 CLINICAL DATA:  [place].  Left leg pain. EXAM: LEFT TIBIA AND FIBULA - 2 VIEW COMPARISON:  None. FINDINGS: Knee and ankle joint degenerative changes are noted. No acute fracture of the tibia or fibula is identified. Extensive vascular calcifications are noted. IMPRESSION: 1. No acute bony findings. 2. Extensive vascular calcifications. Electronically Signed   By: Marijo Sanes M.D.   On: 04/24/2020 14:12   CT Head Wo Contrast  Addendum Date: 04/24/2020   ADDENDUM REPORT: 04/24/2020 13:35 ADDENDUM: Right mastoid effusion noted. Electronically Signed   By: Rolm Baptise M.D.   On: 04/24/2020 13:35   Result Date: 04/24/2020 CLINICAL DATA:  Fall on blood thinners EXAM: CT HEAD WITHOUT CONTRAST TECHNIQUE: Contiguous axial images were obtained from the base of the skull through the vertex without intravenous contrast. COMPARISON:  04/07/2020 FINDINGS: Brain: There is atrophy and chronic small vessel disease changes. No acute intracranial abnormality. Specifically, no hemorrhage, hydrocephalus, mass lesion, acute infarction, or significant intracranial injury. Vascular: No hyperdense vessel or unexpected calcification. Skull: No acute calvarial abnormality. Sinuses/Orbits: Visualized paranasal sinuses and mastoids clear. Orbital soft tissues unremarkable. Other: None IMPRESSION: Atrophy, chronic microvascular  disease. No acute intracranial abnormality. Electronically Signed: By: Rolm Baptise M.D. On: 04/24/2020 13:32   CT Cervical Spine Wo Contrast  Result Date:  04/24/2020 CLINICAL DATA:  Fall, neck trauma EXAM: CT CERVICAL SPINE WITHOUT CONTRAST TECHNIQUE: Multidetector CT imaging of the cervical spine was performed without intravenous contrast. Multiplanar CT image reconstructions were also generated. COMPARISON:  04/07/2020 FINDINGS: Alignment: Normal Skull base and vertebrae: No acute fracture. No primary bone lesion or focal pathologic process. Soft tissues and spinal canal: No prevertebral fluid or swelling. No visible canal hematoma. Disc levels: Diffuse degenerative disc disease with disc space narrowing and spurring. Diffuse degenerative facet disease bilaterally. Upper chest: No acute findings Other: Fluid seen within the right mastoid air cells. IMPRESSION: Diffuse degenerative disc and facet disease. No acute bony abnormality. Right mastoid effusion. Electronically Signed   By: Rolm Baptise M.D.   On: 04/24/2020 13:34   DG Pelvis Portable  Result Date: 04/24/2020 CLINICAL DATA:  [place].  Pelvic pain. EXAM: PORTABLE PELVIS 1-2 VIEWS COMPARISON:  None. FINDINGS: Both hips are normally located. No definite hip or pelvic fractures. The pubic symphysis and SI joints are maintained. Extensive vascular calcifications. IMPRESSION: No acute bony findings. Electronically Signed   By: Marijo Sanes M.D.   On: 04/24/2020 14:11   DG Chest Port 1 View  Result Date: 04/24/2020 CLINICAL DATA:  [place]. EXAM: PORTABLE CHEST 1 VIEW COMPARISON:  04/07/2020 FINDINGS: The heart is enlarged but stable. Stable right IJ Port-A-Cath and left IJ dialysis catheter. No infiltrates, edema or effusions. No pneumothorax. The bony thorax is intact. IMPRESSION: No acute cardiopulmonary findings. Electronically Signed   By: Marijo Sanes M.D.   On: 04/24/2020 14:13   DG Knee Complete 4 Views Left  Result Date: 04/25/2020 CLINICAL DATA:  Fall yesterday with left knee pain EXAM: LEFT KNEE - COMPLETE 4+ VIEW COMPARISON:  10/01/2017 FINDINGS: Joint effusion without acute fracture or  subluxation. Tricompartmental osteoarthritis with lateral translation of the tibia and degenerative spurring primarily at the medial and patellofemoral compartments. Extensive arterial calcification. IMPRESSION: 1. Joint effusion without acute fracture. 2. Tricompartmental osteoarthritis. Electronically Signed   By: Monte Fantasia M.D.   On: 04/25/2020 10:15   DG Femur Min 2 Views Left  Result Date: 04/24/2020 CLINICAL DATA:  [place].  Left femur pain. EXAM: LEFT FEMUR 2 VIEWS COMPARISON:  None. FINDINGS: The hip and knee joints are maintained. Moderate degenerative changes. No acute hip or femur fractures are identified. Extensive vascular calcifications. IMPRESSION: 1. No acute bony findings. 2. Moderate hip and knee joint degenerative changes. Electronically Signed   By: Marijo Sanes M.D.   On: 04/24/2020 14:10     Labs:   Basic Metabolic Panel: Recent Labs  Lab 04/24/20 1312 04/24/20 1312 04/25/20 0226 04/25/20 0226 04/26/20 0514 04/28/20 0835  NA 139  --  138  --  136 139  K 6.1*   < > 3.6   < > 5.4* 4.2  CL 99  --  99  --  98 99  CO2 25  --  27  --  24 28  GLUCOSE 134*  --  111*  --  152* 118*  BUN 34*  --  8  --  23 29*  CREATININE 7.54*  --  2.74*  --  5.20* 6.00*  CALCIUM 8.5*  --  8.2*  --  8.5* 8.2*  PHOS  --   --   --   --   --  5.2*   < > = values  in this interval not displayed.   GFR Estimated Creatinine Clearance: 6.8 mL/min (A) (by C-G formula based on SCr of 6 mg/dL (H)). Liver Function Tests: Recent Labs  Lab 04/24/20 1312 04/28/20 0835  AST 14*  --   ALT 9  --   ALKPHOS 74  --   BILITOT 0.8  --   PROT 6.4*  --   ALBUMIN 2.8* 2.6*   No results for input(s): LIPASE, AMYLASE in the last 168 hours. No results for input(s): AMMONIA in the last 168 hours. Coagulation profile Recent Labs  Lab 04/24/20 1312  INR 1.5*    CBC: Recent Labs  Lab 04/24/20 1312 04/25/20 0226 04/26/20 0514 04/28/20 0835  WBC 9.3 7.4 6.4 6.6  NEUTROABS 7.5  --   --   --    HGB 10.1* 9.1* 10.0* 10.2*  HCT 32.9* 29.3* 31.8* 33.5*  MCV 97.6 94.8 95.5 96.8  PLT 302 247 259 293   Cardiac Enzymes: No results for input(s): CKTOTAL, CKMB, CKMBINDEX, TROPONINI in the last 168 hours. BNP: Invalid input(s): POCBNP CBG: Recent Labs  Lab 04/27/20 1120 04/27/20 1557 04/27/20 2139 04/28/20 0607 04/28/20 1102  GLUCAP 104* 129* 121* 113* 96   D-Dimer No results for input(s): DDIMER in the last 72 hours. Hgb A1c No results for input(s): HGBA1C in the last 72 hours. Lipid Profile No results for input(s): CHOL, HDL, LDLCALC, TRIG, CHOLHDL, LDLDIRECT in the last 72 hours. Thyroid function studies No results for input(s): TSH, T4TOTAL, T3FREE, THYROIDAB in the last 72 hours.  Invalid input(s): FREET3 Anemia work up No results for input(s): VITAMINB12, FOLATE, FERRITIN, TIBC, IRON, RETICCTPCT in the last 72 hours. Microbiology Recent Results (from the past 240 hour(s))  Blood culture (routine single)     Status: None (Preliminary result)   Collection Time: 04/24/20  1:00 PM   Specimen: BLOOD RIGHT FOREARM  Result Value Ref Range Status   Specimen Description BLOOD RIGHT FOREARM  Final   Special Requests   Final    BOTTLES DRAWN AEROBIC AND ANAEROBIC Blood Culture adequate volume   Culture   Final    NO GROWTH 3 DAYS Performed at [facility], [address], [place], [place] [address]    Report Status PENDING  Incomplete  SARS Coronavirus 2 by RT PCR (hospital order, performed in [facility] hospital lab) Nasopharyngeal Nasopharyngeal Swab     Status: None   Collection Time: 04/24/20  1:00 PM   Specimen: Nasopharyngeal Swab  Result Value Ref Range Status   SARS Coronavirus 2 NEGATIVE NEGATIVE Final    Comment: (NOTE) SARS-CoV-2 target nucleic acids are NOT DETECTED.  The SARS-CoV-2 RNA is generally detectable in upper and lower respiratory specimens during the acute phase of infection. The lowest concentration of SARS-CoV-2 viral copies this  assay can detect is 250 copies / mL. A negative result does not preclude SARS-CoV-2 infection and should not be used as the sole basis for treatment or other patient management decisions.  A negative result may occur with improper specimen collection / handling, submission of specimen other than nasopharyngeal swab, presence of viral mutation(s) within the areas targeted by this assay, and inadequate number of viral copies (<250 copies / mL). A negative result must be combined with clinical observations, patient history, and epidemiological information.  Fact Sheet for Patients:   StrictlyIdeas.no  Fact Sheet for Healthcare Providers: BankingDealers.co.za  This test is not yet approved or  cleared by the [place] FDA and has been authorized for  detection and/or diagnosis of SARS-CoV-2 by FDA under an Emergency Use Authorization (EUA).  This EUA will remain in effect (meaning this test can be used) for the duration of the COVID-19 declaration under Section 564(b)(1) of the Act, 21 U.S.C. section 360bbb-3(b)(1), unless the authorization is terminated or revoked sooner.  Performed at [facility], [place] [address] [place], [place] [address]   Urine culture     Status: None   Collection Time: 04/24/20  3:32 PM   Specimen: Urine, Random  Result Value Ref Range Status   Specimen Description URINE, RANDOM  Final   Special Requests NONE  Final   Culture   Final    NO GROWTH Performed at [facility], [place] [address], [place], [place] [address]    Report Status 04/25/2020 FINAL  Final  Body fluid culture     Status: None   Collection Time: 04/25/20  1:54 PM   Specimen: Synovium; Body Fluid  Result Value Ref Range Status   Specimen Description SYNOVIAL  Final   Special Requests Normal  Final   Gram Stain   Final    ABUNDANT WBC PRESENT,BOTH PMN AND MONONUCLEAR NO ORGANISMS SEEN    Culture   Final    NO  GROWTH Performed at [facility], [address], [place], [place] [address]    Report Status 04/28/2020 FINAL  Final  SARS Coronavirus 2 by RT PCR (hospital order, performed in [facility] hospital lab) Nasopharyngeal Nasopharyngeal Swab     Status: None   Collection Time: 04/26/20  3:41 PM   Specimen: Nasopharyngeal Swab  Result Value Ref Range Status   SARS Coronavirus 2 NEGATIVE NEGATIVE Final    Comment: (NOTE) SARS-CoV-2 target nucleic acids are NOT DETECTED.  The SARS-CoV-2 RNA is generally detectable in upper and lower respiratory specimens during the acute phase of infection. The lowest concentration of SARS-CoV-2 viral copies this assay can detect is 250 copies / mL. A negative result does not preclude SARS-CoV-2 infection and should not be used as the sole basis for treatment or other patient management decisions.  A negative result may occur with improper specimen collection / handling, submission of specimen other than nasopharyngeal swab, presence of viral mutation(s) within the areas targeted by this assay, and inadequate number of viral copies (<250 copies / mL). A negative result must be combined with clinical observations, patient history, and epidemiological information.  Fact Sheet for Patients:   StrictlyIdeas.no  Fact Sheet for Healthcare Providers: BankingDealers.co.za  This test is not yet approved or  cleared by the [place] FDA and has been authorized for detection and/or diagnosis of SARS-CoV-2 by FDA under an Emergency Use Authorization (EUA).  This EUA will remain in effect (meaning this test can be used) for the duration of the COVID-19 declaration under Section 564(b)(1) of the Act, 21 U.S.C. section 360bbb-3(b)(1), unless the authorization is terminated or revoked sooner.  Performed at [facility], [place] [address] [place], [place] [address]      Discharge Instructions:    Discharge Instructions    Diet - low sodium heart healthy   Complete by: As directed    Increase activity slowly   Complete by: As directed    Increase activity slowly   Complete by: As directed      Allergies as of 04/28/2020   No Known Allergies     Medication List    TAKE these medications   acetaminophen 325 MG tablet Commonly  known as: TYLENOL Take 650 mg by mouth daily as needed for headache (pain).   albuterol 108 (90 Base) MCG/ACT inhaler Commonly known as: VENTOLIN HFA Inhale 2 puffs into the lungs every 6 (six) hours as needed for wheezing or shortness of breath.   albuterol (2.5 MG/3ML) 0.083% nebulizer solution Commonly known as: PROVENTIL Take 2.5 mg by nebulization 4 (four) times daily as needed for wheezing or shortness of breath.   allopurinol 100 MG tablet Commonly known as: ZYLOPRIM Take 100 mg by mouth 2 (two) times daily.   amiodarone 200 MG tablet Commonly known as: PACERONE Take 200 mg by mouth daily.   aspirin EC 81 MG tablet Take 81 mg by mouth daily. Swallow whole.   atorvastatin 40 MG tablet Commonly known as: LIPITOR Take 40 mg by mouth at bedtime.   Auryxia 1 GM 210 MG(Fe) tablet Generic drug: ferric citrate Take 210 mg by mouth 2 (two) times daily with a meal.   Biotin 5000 MCG Tabs Take 5,000 mcg by mouth daily.   Clear Eyes for Dry Eyes 1-0.25 % Soln Generic drug: Carboxymethylcellul-Glycerin Place 1 drop into both eyes daily as needed (dry eyes).   diclofenac Sodium 1 % Gel Commonly known as: VOLTAREN Apply 2 g topically 4 (four) times daily.   Eliquis 2.5 MG Tabs tablet Generic drug: apixaban Take 2.5 mg by mouth 2 (two) times daily.   esomeprazole 40 MG capsule Commonly known as: NEXIUM Take 40 mg by mouth daily.   famotidine 40 MG tablet Commonly known as: PEPCID Take 40 mg by mouth at bedtime.   gabapentin 100 MG capsule Commonly known as: NEURONTIN Take 100 mg by mouth at bedtime.   lidocaine 5  % Commonly known as: LIDODERM Place 1 patch onto the skin daily.   midodrine 10 MG tablet Commonly known as: PROAMATINE Take 1 tablet (10 mg total) by mouth 3 (three) times daily with meals. What changed:   medication strength  how much to take  when to take this  additional instructions   nitroGLYCERIN 0.4 MG SL tablet Commonly known as: NITROSTAT Place 0.4 mg under the tongue every 5 (five) minutes as needed for chest pain.   ondansetron 4 MG tablet Commonly known as: ZOFRAN Take 4 mg by mouth every 8 (eight) hours as needed for nausea or vomiting.   OXYGEN Inhale 2 L into the lungs as needed (shortness of breath).   PRESCRIPTION MEDICATION Inhale into the lungs at bedtime. CPAP   tamsulosin 0.4 MG Caps capsule Commonly known as: FLOMAX Take 0.4 mg by mouth daily.   traMADol 50 MG tablet Commonly known as: ULTRAM Take 1 tablet (50 mg total) by mouth every 8 (eight) hours as needed (pain).   Trelegy Ellipta 100-62.5-25 MCG/INH Aepb Generic drug: Fluticasone-Umeclidin-Vilant Inhale 1 puff into the lungs daily as needed (shortness of breath/wheezing).       Contact information for follow-up providers    [name], MD Follow up in 1 week(s).   Specialty: Family Medicine Contact information: [place] [address] [phone]            Contact information for after-discharge care    Destination    [facility] .   Service: Skilled Nursing Contact information: [place] [phone]                   Time coordinating discharge: 35 min  Signed:  Geradine Girt  DO  [facility] 04/28/2020, 11:53 AM

## 2020-04-28 NOTE — Progress Notes (Signed)
Pt still waiting for [facility] to arrive to take pt to SNF, [facility].  [facility] called [facility] at aprrox [phone] to make sure pt was still on the list to pick up, they stated pt was and she was 4th on the list. Pt informed of this, she states that the social worker told her earlier this evening that she was 4th on the list then.   Pt informed her son that she is here and still waiting for pick up.  Will attempt to call [facility] again.

## 2020-04-28 NOTE — Progress Notes (Signed)
[facility] Progress Note   Dialysis Orders: MWF [facility]    4h  78kg  P4  Hep none  2/2.5 bath  TDC/ LUA AVF  Calcitriol 1.75  mircera 75 q 2 venofer 100 Last hgb 10.3, calc 8.1, phos 7.9, pth 418  Assessment/Plan: 1. S/p fall-Many x-rays all negative- Conservative management/PT-d/c today 2. Fever-Cultures obtained- U/A and cxr clear, all imaging clear as well. Blood cx's neg. Fevers resolved. Not on abx.  3. ESRD: MWF schedule using the TDC. AVF used Wed by her report - using TDC today - resume use at outpt HD and titrate up -  4. HypOtension/vol:previously had midodrine 2.5 mg pre HD. 2.3 kg off w/ HD Wed Pt concerned about orthostatic dizziness > midodrine ^to 10 mg tid and raise her dry wt 2kg to 80kg. Keeping even today pre HD wt 78.1 - get standing wt post HD 5. Anemia of ESRD:hgb over 10- No ESA needed at present. Resume IV Fe at dc. 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 COPD/ OSA 8. Afib on eliquis 9. Gout 10. DM2   Myriam Jacobson, PA-C [facility] Beeper [phone] 04/28/2020,8:39 AM  LOS: 3 days   Subjective:   Plan for d/c today to SNF in [facility] and transition to the [facility] unit there on Monday.  Objective Vitals:   04/27/20 0428 04/27/20 1247 04/28/20 0040 04/28/20 0449  BP: (!) 114/56 135/62 (!) 128/55 131/60  Pulse: 78  70 74  Resp: 15  18 18   Temp: 98 F (36.7 C)  98 F (36.7 C) 97.7 F (36.5 C)  TempSrc: Oral  Oral Oral  SpO2: 95% 95% 95% 98%  Weight:      Height:       Physical Exam General: NAD supine on HD Heart: RRR Lungs: no rales Abdomen: obese soft NT Extremities: no sig edema -  Dialysis Access: left AVF + bruit left [facility]   Additional Objective Labs: Basic Metabolic Panel: Recent Labs  Lab 04/24/20 1312 04/25/20 0226 04/26/20 0514  NA 139 138 136  K 6.1* 3.6 5.4*  CL 99 99 98  CO2 25 27 24   GLUCOSE 134* 111* 152*  BUN 34* 8 23  CREATININE 7.54*  2.74* 5.20*  CALCIUM 8.5* 8.2* 8.5*   Liver Function Tests: Recent Labs  Lab 04/24/20 1312  AST 14*  ALT 9  ALKPHOS 74  BILITOT 0.8  PROT 6.4*  ALBUMIN 2.8*   No results for input(s): LIPASE, AMYLASE in the last 168 hours. CBC: Recent Labs  Lab 04/24/20 1312 04/25/20 0226 04/26/20 0514  WBC 9.3 7.4 6.4  NEUTROABS 7.5  --   --   HGB 10.1* 9.1* 10.0*  HCT 32.9* 29.3* 31.8*  MCV 97.6 94.8 95.5  PLT 302 247 259   Blood Culture    Component Value Date/Time   SDES SYNOVIAL 04/25/2020 1354   SPECREQUEST Normal 04/25/2020 1354   CULT  04/25/2020 1354    NO GROWTH 2 DAYS Performed at [facility], [place] [address], [place], [place] [address]    REPTSTATUS PENDING 04/25/2020 1354    Cardiac Enzymes: No results for input(s): CKTOTAL, CKMB, CKMBINDEX, TROPONINI in the last 168 hours. CBG: Recent Labs  Lab 04/27/20 0929 04/27/20 1120 04/27/20 1557 04/27/20 2139 04/28/20 0607  GLUCAP 120* 104* 129* 121* 113*   Iron Studies: No results for input(s): IRON, TIBC, TRANSFERRIN, FERRITIN in the last 72 hours. Lab Results  Component Value  Date   INR 1.5 (H) 04/24/2020   Studies/Results: No results found. Medications: . sodium chloride    . sodium chloride     . allopurinol  100 mg Oral BID  . amiodarone  200 mg Oral Daily  . apixaban  2.5 mg Oral BID  . aspirin EC  81 mg Oral Daily  . atorvastatin  40 mg Oral QHS  . bupivacaine  10 mL Infiltration Once  . calcitRIOL  1.75 mcg Oral Q M,W,F-HD  . Chlorhexidine Gluconate Cloth  6 each Topical Q0600  . diclofenac Sodium  2 g Topical QID  . famotidine  10 mg Oral QHS  . ferric citrate  420 mg Oral TID WC  . gabapentin  100 mg Oral QHS  . insulin aspart  0-6 Units Subcutaneous TID WC  . methylPREDNISolone acetate  40 mg Intra-articular Once  . midodrine  10 mg Oral TID WC  . pantoprazole  40 mg Oral Daily  . sodium chloride flush  3 mL Intravenous Q12H  . tamsulosin  0.4 mg Oral Daily

## 2020-04-28 NOTE — Progress Notes (Signed)
Attempting to call [facility] again, spoke with someone named Legrand Como, made him aware had tried to call report earlier, and that no one ever picked up the phone to receive report.  Informed him [facility] was here to pick up pt, and wanted to try again to give report. He transferred call to receiving hall 300, again phone rang with no one picking up. Unable to give report, pt has been at this facility before, as the person "Legrand Como" said he was aware she has been there before, few months ago.     At this time, [facility] here picking up pt, pt belongings packed and pt ready to go. Packet of information for [facility] to give facility taken.  Pt also has her own copy of d/c instructions for her and her son.

## 2020-04-28 NOTE — Social Work (Signed)
Clinical Social Worker facilitated patient discharge including contacting patient family and facility to confirm patient discharge plans.  Clinical information faxed to facility and family agreeable with plan.  CSW arranged ambulance transport via [facility] to [place].  RN to call [phone]  with report prior to discharge.  Clinical Social Worker will sign off for now as social work intervention is no longer needed. Please consult [place] again if new need arises.  Westley Hummer, MSW, LCSW Clinical Social Worker

## 2020-04-28 NOTE — TOC Transition Note (Signed)
Transition of Care [facility]) - CM/SW Discharge Note   Patient Details  Name: [name] MRN: [id number] Date of Birth: 09-Dec-1945  Transition of Care [facility]) CM/SW Contact:  Alexander Mt, LCSW Phone Number: 04/28/2020, 1:44 PM   Clinical Narrative:    CSW spoke with pt at bedside. Pt has updated her son and is ready to go. [facility] papers and controlled script on chart. All questions answered, bedside RN to give report. All paperwork sent through Epic.   Barriers to Discharge: Continued Medical Work up   Patient Goals and CMS Choice Patient states their goals for this hospitalization and ongoing recovery are:: to get stronger before returning home CMS Medicare.gov Compare Post Acute Care list provided to:: Patient Choice offered to / list presented to : Patient, Adult Children  Discharge Plan and Services In-house Referral: Clinical Social Work Discharge Planning Services: CM Consult, DC out of service area (changing OP dialysis) Post Acute Care Choice: [place]            Readmission Risk Interventions Readmission Risk Prevention Plan 04/28/2020  Transportation Screening Complete  PCP or Specialist Appt within 3-5 Days Not Complete  Not Complete comments going to SNF  [place] or [place] Complete  Social Work Consult for [place] Planning/Counseling Complete  Palliative Care Screening Not Applicable  Medication Review Press photographer) Complete

## 2020-04-29 LAB — CULTURE, BLOOD (SINGLE)
Culture: NO GROWTH
Special Requests: ADEQUATE

## 2020-05-01 ENCOUNTER — Encounter (HOSPITAL_COMMUNITY): Payer: Self-pay | Admitting: Emergency Medicine

## 2020-06-28 DIAGNOSIS — T7840XS Allergy, unspecified, sequela: Secondary | ICD-10-CM | POA: Insufficient documentation

## 2020-06-28 DIAGNOSIS — Z87892 Personal history of anaphylaxis: Secondary | ICD-10-CM | POA: Insufficient documentation

## 2020-07-10 ENCOUNTER — Other Ambulatory Visit: Payer: Self-pay

## 2020-07-10 DIAGNOSIS — N186 End stage renal disease: Secondary | ICD-10-CM

## 2020-07-10 DIAGNOSIS — Z992 Dependence on renal dialysis: Secondary | ICD-10-CM

## 2020-07-18 ENCOUNTER — Encounter (HOSPITAL_COMMUNITY)

## 2020-07-18 ENCOUNTER — Encounter: Admitting: Vascular Surgery

## 2020-07-18 ENCOUNTER — Other Ambulatory Visit (HOSPITAL_COMMUNITY)

## 2020-07-19 ENCOUNTER — Emergency Department (HOSPITAL_COMMUNITY): Payer: Medicare Other

## 2020-07-19 ENCOUNTER — Other Ambulatory Visit: Payer: Self-pay

## 2020-07-19 ENCOUNTER — Encounter (HOSPITAL_COMMUNITY): Payer: Self-pay | Admitting: *Deleted

## 2020-07-19 ENCOUNTER — Inpatient Hospital Stay (HOSPITAL_COMMUNITY)
Admission: EM | Admit: 2020-07-19 | Discharge: 2020-07-27 | DRG: 177 | Disposition: A | Discharge status: Home Health | Payer: Medicare Other | Attending: Internal Medicine | Admitting: Internal Medicine

## 2020-07-19 DIAGNOSIS — I132 Hypertensive heart and chronic kidney disease with heart failure and with stage 5 chronic kidney disease, or end stage renal disease: Secondary | ICD-10-CM | POA: Diagnosis present

## 2020-07-19 DIAGNOSIS — E785 Hyperlipidemia, unspecified: Secondary | ICD-10-CM | POA: Diagnosis present

## 2020-07-19 DIAGNOSIS — E1165 Type 2 diabetes mellitus with hyperglycemia: Secondary | ICD-10-CM | POA: Diagnosis present

## 2020-07-19 DIAGNOSIS — E1122 Type 2 diabetes mellitus with diabetic chronic kidney disease: Secondary | ICD-10-CM | POA: Diagnosis present

## 2020-07-19 DIAGNOSIS — U071 COVID-19: Principal | ICD-10-CM

## 2020-07-19 DIAGNOSIS — Z9981 Dependence on supplemental oxygen: Secondary | ICD-10-CM

## 2020-07-19 DIAGNOSIS — Z794 Long term (current) use of insulin: Secondary | ICD-10-CM

## 2020-07-19 DIAGNOSIS — G4733 Obstructive sleep apnea (adult) (pediatric): Secondary | ICD-10-CM | POA: Diagnosis present

## 2020-07-19 DIAGNOSIS — Z8249 Family history of ischemic heart disease and other diseases of the circulatory system: Secondary | ICD-10-CM

## 2020-07-19 DIAGNOSIS — Z6837 Body mass index (BMI) 37.0-37.9, adult: Secondary | ICD-10-CM

## 2020-07-19 DIAGNOSIS — E876 Hypokalemia: Secondary | ICD-10-CM | POA: Diagnosis present

## 2020-07-19 DIAGNOSIS — Z992 Dependence on renal dialysis: Secondary | ICD-10-CM

## 2020-07-19 DIAGNOSIS — Z9115 Patient's noncompliance with renal dialysis: Secondary | ICD-10-CM

## 2020-07-19 DIAGNOSIS — J9601 Acute respiratory failure with hypoxia: Secondary | ICD-10-CM | POA: Diagnosis present

## 2020-07-19 DIAGNOSIS — Z79899 Other long term (current) drug therapy: Secondary | ICD-10-CM

## 2020-07-19 DIAGNOSIS — M109 Gout, unspecified: Secondary | ICD-10-CM | POA: Diagnosis present

## 2020-07-19 DIAGNOSIS — N186 End stage renal disease: Secondary | ICD-10-CM

## 2020-07-19 DIAGNOSIS — I2510 Atherosclerotic heart disease of native coronary artery without angina pectoris: Secondary | ICD-10-CM | POA: Diagnosis present

## 2020-07-19 DIAGNOSIS — D631 Anemia in chronic kidney disease: Secondary | ICD-10-CM | POA: Diagnosis present

## 2020-07-19 DIAGNOSIS — E669 Obesity, unspecified: Secondary | ICD-10-CM | POA: Diagnosis present

## 2020-07-19 DIAGNOSIS — I509 Heart failure, unspecified: Secondary | ICD-10-CM | POA: Diagnosis present

## 2020-07-19 DIAGNOSIS — M898X9 Other specified disorders of bone, unspecified site: Secondary | ICD-10-CM | POA: Diagnosis present

## 2020-07-19 DIAGNOSIS — I959 Hypotension, unspecified: Secondary | ICD-10-CM | POA: Diagnosis not present

## 2020-07-19 DIAGNOSIS — M810 Age-related osteoporosis without current pathological fracture: Secondary | ICD-10-CM | POA: Diagnosis present

## 2020-07-19 DIAGNOSIS — J1282 Pneumonia due to coronavirus disease 2019: Secondary | ICD-10-CM | POA: Diagnosis present

## 2020-07-19 DIAGNOSIS — E1142 Type 2 diabetes mellitus with diabetic polyneuropathy: Secondary | ICD-10-CM | POA: Diagnosis present

## 2020-07-19 DIAGNOSIS — J440 Chronic obstructive pulmonary disease with acute lower respiratory infection: Secondary | ICD-10-CM | POA: Diagnosis present

## 2020-07-19 DIAGNOSIS — J9621 Acute and chronic respiratory failure with hypoxia: Secondary | ICD-10-CM | POA: Diagnosis present

## 2020-07-19 DIAGNOSIS — Z833 Family history of diabetes mellitus: Secondary | ICD-10-CM

## 2020-07-19 DIAGNOSIS — L89152 Pressure ulcer of sacral region, stage 2: Secondary | ICD-10-CM | POA: Diagnosis present

## 2020-07-19 DIAGNOSIS — M5416 Radiculopathy, lumbar region: Secondary | ICD-10-CM | POA: Diagnosis present

## 2020-07-19 DIAGNOSIS — I10 Essential (primary) hypertension: Secondary | ICD-10-CM | POA: Diagnosis present

## 2020-07-19 DIAGNOSIS — Z7982 Long term (current) use of aspirin: Secondary | ICD-10-CM

## 2020-07-19 DIAGNOSIS — Z7951 Long term (current) use of inhaled steroids: Secondary | ICD-10-CM

## 2020-07-19 DIAGNOSIS — J9600 Acute respiratory failure, unspecified whether with hypoxia or hypercapnia: Secondary | ICD-10-CM

## 2020-07-19 DIAGNOSIS — L8990 Pressure ulcer of unspecified site, unspecified stage: Secondary | ICD-10-CM

## 2020-07-19 DIAGNOSIS — I480 Paroxysmal atrial fibrillation: Secondary | ICD-10-CM | POA: Diagnosis present

## 2020-07-19 DIAGNOSIS — J449 Chronic obstructive pulmonary disease, unspecified: Secondary | ICD-10-CM | POA: Diagnosis present

## 2020-07-19 DIAGNOSIS — E875 Hyperkalemia: Secondary | ICD-10-CM | POA: Diagnosis not present

## 2020-07-19 DIAGNOSIS — Z7901 Long term (current) use of anticoagulants: Secondary | ICD-10-CM

## 2020-07-19 DIAGNOSIS — K219 Gastro-esophageal reflux disease without esophagitis: Secondary | ICD-10-CM | POA: Diagnosis present

## 2020-07-19 DIAGNOSIS — E8770 Fluid overload, unspecified: Secondary | ICD-10-CM | POA: Diagnosis present

## 2020-07-19 DIAGNOSIS — E782 Mixed hyperlipidemia: Secondary | ICD-10-CM | POA: Diagnosis present

## 2020-07-19 DIAGNOSIS — Z8541 Personal history of malignant neoplasm of cervix uteri: Secondary | ICD-10-CM

## 2020-07-19 DIAGNOSIS — Z87891 Personal history of nicotine dependence: Secondary | ICD-10-CM

## 2020-07-19 LAB — BASIC METABOLIC PANEL
Anion gap: 21 — ABNORMAL HIGH (ref 5–15)
BUN: 57 mg/dL — ABNORMAL HIGH (ref 8–23)
CO2: 18 mmol/L — ABNORMAL LOW (ref 22–32)
Calcium: 6.3 mg/dL — CL (ref 8.9–10.3)
Chloride: 102 mmol/L (ref 98–111)
Creatinine, Ser: 12.99 mg/dL — ABNORMAL HIGH (ref 0.44–1.00)
GFR, Estimated: 3 mL/min — ABNORMAL LOW (ref >60)
Glucose, Bld: 76 mg/dL (ref 70–99)
Potassium: 6.8 mmol/L (ref 3.5–5.1)
Sodium: 141 mmol/L (ref 135–145)

## 2020-07-19 LAB — CBC
HCT: 41.9 % (ref 36.0–46.0)
Hemoglobin: 13 g/dL (ref 12.0–15.0)
MCH: 30.3 pg (ref 26.0–34.0)
MCHC: 31 g/dL (ref 30.0–36.0)
MCV: 97.7 fL (ref 80.0–100.0)
Platelets: 166 10*3/uL (ref 150–400)
RBC: 4.29 MIL/uL (ref 3.87–5.11)
RDW: 14.7 % (ref 11.5–15.5)
WBC: 5.2 10*3/uL (ref 4.0–10.5)
nRBC: 0 % (ref 0.0–0.2)

## 2020-07-19 NOTE — ED Triage Notes (Signed)
Critical labs called acuity increased to a 2

## 2020-07-19 NOTE — ED Triage Notes (Signed)
The pt is c/o a headache cough earache nausea vomiting diarrhea for 2 weeks productive cough she has been lioving with a person with covid   She has been jad the vaccine

## 2020-07-20 ENCOUNTER — Encounter (HOSPITAL_COMMUNITY): Payer: Self-pay | Admitting: Internal Medicine

## 2020-07-20 DIAGNOSIS — N186 End stage renal disease: Secondary | ICD-10-CM | POA: Diagnosis present

## 2020-07-20 DIAGNOSIS — J440 Chronic obstructive pulmonary disease with acute lower respiratory infection: Secondary | ICD-10-CM | POA: Diagnosis present

## 2020-07-20 DIAGNOSIS — E1165 Type 2 diabetes mellitus with hyperglycemia: Secondary | ICD-10-CM | POA: Diagnosis present

## 2020-07-20 DIAGNOSIS — E1122 Type 2 diabetes mellitus with diabetic chronic kidney disease: Secondary | ICD-10-CM | POA: Diagnosis present

## 2020-07-20 DIAGNOSIS — E1142 Type 2 diabetes mellitus with diabetic polyneuropathy: Secondary | ICD-10-CM

## 2020-07-20 DIAGNOSIS — I509 Heart failure, unspecified: Secondary | ICD-10-CM | POA: Diagnosis present

## 2020-07-20 DIAGNOSIS — E8770 Fluid overload, unspecified: Secondary | ICD-10-CM | POA: Diagnosis present

## 2020-07-20 DIAGNOSIS — U071 COVID-19: Principal | ICD-10-CM

## 2020-07-20 DIAGNOSIS — I480 Paroxysmal atrial fibrillation: Secondary | ICD-10-CM | POA: Diagnosis present

## 2020-07-20 DIAGNOSIS — Z992 Dependence on renal dialysis: Secondary | ICD-10-CM

## 2020-07-20 DIAGNOSIS — M109 Gout, unspecified: Secondary | ICD-10-CM | POA: Diagnosis present

## 2020-07-20 DIAGNOSIS — J9600 Acute respiratory failure, unspecified whether with hypoxia or hypercapnia: Secondary | ICD-10-CM | POA: Diagnosis not present

## 2020-07-20 DIAGNOSIS — J9601 Acute respiratory failure with hypoxia: Secondary | ICD-10-CM | POA: Diagnosis not present

## 2020-07-20 DIAGNOSIS — L89152 Pressure ulcer of sacral region, stage 2: Secondary | ICD-10-CM | POA: Diagnosis present

## 2020-07-20 DIAGNOSIS — M898X9 Other specified disorders of bone, unspecified site: Secondary | ICD-10-CM | POA: Diagnosis present

## 2020-07-20 DIAGNOSIS — D631 Anemia in chronic kidney disease: Secondary | ICD-10-CM | POA: Diagnosis present

## 2020-07-20 DIAGNOSIS — I10 Essential (primary) hypertension: Secondary | ICD-10-CM | POA: Diagnosis not present

## 2020-07-20 DIAGNOSIS — I2510 Atherosclerotic heart disease of native coronary artery without angina pectoris: Secondary | ICD-10-CM | POA: Diagnosis present

## 2020-07-20 DIAGNOSIS — E875 Hyperkalemia: Secondary | ICD-10-CM | POA: Diagnosis present

## 2020-07-20 DIAGNOSIS — E876 Hypokalemia: Secondary | ICD-10-CM | POA: Diagnosis present

## 2020-07-20 DIAGNOSIS — M5416 Radiculopathy, lumbar region: Secondary | ICD-10-CM | POA: Diagnosis present

## 2020-07-20 DIAGNOSIS — I959 Hypotension, unspecified: Secondary | ICD-10-CM | POA: Diagnosis not present

## 2020-07-20 DIAGNOSIS — E782 Mixed hyperlipidemia: Secondary | ICD-10-CM | POA: Diagnosis present

## 2020-07-20 DIAGNOSIS — J9621 Acute and chronic respiratory failure with hypoxia: Secondary | ICD-10-CM | POA: Diagnosis present

## 2020-07-20 DIAGNOSIS — J1282 Pneumonia due to coronavirus disease 2019: Secondary | ICD-10-CM | POA: Diagnosis present

## 2020-07-20 DIAGNOSIS — K219 Gastro-esophageal reflux disease without esophagitis: Secondary | ICD-10-CM | POA: Diagnosis present

## 2020-07-20 DIAGNOSIS — J410 Simple chronic bronchitis: Secondary | ICD-10-CM | POA: Diagnosis not present

## 2020-07-20 DIAGNOSIS — I132 Hypertensive heart and chronic kidney disease with heart failure and with stage 5 chronic kidney disease, or end stage renal disease: Secondary | ICD-10-CM | POA: Diagnosis present

## 2020-07-20 LAB — CBC
HCT: 36.6 % (ref 36.0–46.0)
Hemoglobin: 11.7 g/dL — ABNORMAL LOW (ref 12.0–15.0)
MCH: 29.8 pg (ref 26.0–34.0)
MCHC: 32 g/dL (ref 30.0–36.0)
MCV: 93.4 fL (ref 80.0–100.0)
Platelets: 148 10*3/uL — ABNORMAL LOW (ref 150–400)
RBC: 3.92 MIL/uL (ref 3.87–5.11)
RDW: 14.4 % (ref 11.5–15.5)
WBC: 6.2 10*3/uL (ref 4.0–10.5)
nRBC: 0 % (ref 0.0–0.2)

## 2020-07-20 LAB — COMPREHENSIVE METABOLIC PANEL
ALT: 10 U/L (ref 0–44)
AST: 18 U/L (ref 15–41)
Albumin: 2.8 g/dL — ABNORMAL LOW (ref 3.5–5.0)
Alkaline Phosphatase: 56 U/L (ref 38–126)
Anion gap: 21 — ABNORMAL HIGH (ref 5–15)
BUN: 56 mg/dL — ABNORMAL HIGH (ref 8–23)
CO2: 20 mmol/L — ABNORMAL LOW (ref 22–32)
Calcium: 5.9 mg/dL — CL (ref 8.9–10.3)
Chloride: 96 mmol/L — ABNORMAL LOW (ref 98–111)
Creatinine, Ser: 13.51 mg/dL — ABNORMAL HIGH (ref 0.44–1.00)
GFR, Estimated: 3 mL/min — ABNORMAL LOW (ref >60)
Glucose, Bld: 202 mg/dL — ABNORMAL HIGH (ref 70–99)
Potassium: >7.5 mmol/L (ref 3.5–5.1)
Sodium: 137 mmol/L (ref 135–145)
Total Bilirubin: 0.6 mg/dL (ref 0.3–1.2)
Total Protein: 5.9 g/dL — ABNORMAL LOW (ref 6.5–8.1)

## 2020-07-20 LAB — GLUCOSE, CAPILLARY
Glucose-Capillary: 84 mg/dL (ref 70–99)
Glucose-Capillary: 94 mg/dL (ref 70–99)
Glucose-Capillary: 101 mg/dL — ABNORMAL HIGH (ref 70–99)
Glucose-Capillary: 102 mg/dL — ABNORMAL HIGH (ref 70–99)
Glucose-Capillary: 100 mg/dL — ABNORMAL HIGH (ref 70–99)
Glucose-Capillary: 113 mg/dL — ABNORMAL HIGH (ref 70–99)
Glucose-Capillary: 128 mg/dL — ABNORMAL HIGH (ref 70–99)
Glucose-Capillary: 140 mg/dL — ABNORMAL HIGH (ref 70–99)
Glucose-Capillary: 209 mg/dL — ABNORMAL HIGH (ref 70–99)
Glucose-Capillary: 218 mg/dL — ABNORMAL HIGH (ref 70–99)
Glucose-Capillary: 158 mg/dL — ABNORMAL HIGH (ref 70–99)

## 2020-07-20 LAB — RESP PANEL BY RT-PCR (FLU A&B, COVID) ARPGX2
Influenza A by PCR: NEGATIVE
Influenza B by PCR: NEGATIVE
SARS Coronavirus 2 by RT PCR: POSITIVE — AB

## 2020-07-20 LAB — PROCALCITONIN: Procalcitonin: 0.24 ng/mL

## 2020-07-20 LAB — CBG MONITORING, ED
Glucose-Capillary: 65 mg/dL — ABNORMAL LOW (ref 70–99)
Glucose-Capillary: 86 mg/dL (ref 70–99)
Glucose-Capillary: 62 mg/dL — ABNORMAL LOW (ref 70–99)

## 2020-07-20 LAB — PHOSPHORUS: Phosphorus: 7.8 mg/dL — ABNORMAL HIGH (ref 2.5–4.6)

## 2020-07-20 LAB — TROPONIN I (HIGH SENSITIVITY): Troponin I (High Sensitivity): 19 ng/L — ABNORMAL HIGH (ref <18)

## 2020-07-20 LAB — D-DIMER, QUANTITATIVE: D-Dimer, Quant: 0.83 ug/mL-FEU — ABNORMAL HIGH (ref 0.00–0.50)

## 2020-07-20 LAB — C-REACTIVE PROTEIN: CRP: 1 mg/dL — ABNORMAL HIGH (ref <1.0)

## 2020-07-20 MED ORDER — CALCIUM GLUCONATE-NACL 1-0.675 GM/50ML-% IV SOLN
1.0000 g | Freq: Once | INTRAVENOUS | Status: AC
  Administered 2020-07-20: 1000 mg via INTRAVENOUS
  Filled 2020-07-20: qty 50

## 2020-07-20 MED ORDER — SODIUM CHLORIDE 0.9 % IV SOLN: 200.0000 mg | Freq: Once | INTRAVENOUS | Status: DC

## 2020-07-20 MED ORDER — HYDROCOD POLST-CPM POLST ER 10-8 MG/5ML PO SUER
5.0000 mL | Freq: Two times a day (BID) | ORAL | Status: DC | PRN
  Administered 2020-07-20 – 2020-07-21 (×2): 5 mL via ORAL
  Filled 2020-07-20 (×2): qty 5

## 2020-07-20 MED ORDER — GABAPENTIN 100 MG PO CAPS
100.0000 mg | ORAL_CAPSULE | Freq: Every day | ORAL | Status: DC
  Administered 2020-07-21 – 2020-07-26 (×6): 100 mg via ORAL
  Filled 2020-07-20 (×7): qty 1

## 2020-07-20 MED ORDER — CHLORHEXIDINE GLUCONATE CLOTH 2 % EX PADS
6.0000 | MEDICATED_PAD | Freq: Every day | CUTANEOUS | Status: DC
  Administered 2020-07-20 – 2020-07-23 (×4): 6 via TOPICAL

## 2020-07-20 MED ORDER — APIXABAN 2.5 MG PO TABS
2.5000 mg | ORAL_TABLET | Freq: Two times a day (BID) | ORAL | Status: DC
  Administered 2020-07-20 – 2020-07-27 (×14): 2.5 mg via ORAL
  Filled 2020-07-20 (×14): qty 1

## 2020-07-20 MED ORDER — SODIUM CHLORIDE 0.9 % IV SOLN
200.0000 mg | Freq: Once | INTRAVENOUS | Status: AC
  Administered 2020-07-20: 200 mg via INTRAVENOUS
  Filled 2020-07-20: qty 40

## 2020-07-20 MED ORDER — LIDOCAINE HCL (PF) 1 % IJ SOLN
5.0000 mL | INTRAMUSCULAR | Status: DC | PRN
  Filled 2020-07-20: qty 5

## 2020-07-20 MED ORDER — TRAMADOL HCL 50 MG PO TABS
50.0000 mg | ORAL_TABLET | Freq: Three times a day (TID) | ORAL | Status: DC | PRN
  Administered 2020-07-20: 50 mg via ORAL
  Filled 2020-07-20: qty 1

## 2020-07-20 MED ORDER — MIDODRINE HCL 5 MG PO TABS
ORAL_TABLET | ORAL | Status: AC
  Filled 2020-07-20: qty 2

## 2020-07-20 MED ORDER — ATORVASTATIN CALCIUM 40 MG PO TABS
40.0000 mg | ORAL_TABLET | Freq: Every day | ORAL | Status: DC
  Administered 2020-07-21 – 2020-07-26 (×6): 40 mg via ORAL
  Filled 2020-07-20 (×7): qty 1

## 2020-07-20 MED ORDER — ALLOPURINOL 100 MG PO TABS
100.0000 mg | ORAL_TABLET | Freq: Two times a day (BID) | ORAL | Status: DC
  Administered 2020-07-20 – 2020-07-27 (×14): 100 mg via ORAL
  Filled 2020-07-20 (×15): qty 1

## 2020-07-20 MED ORDER — ALBUTEROL SULFATE (2.5 MG/3ML) 0.083% IN NEBU: 10.0000 mg | INHALATION_SOLUTION | Freq: Once | RESPIRATORY_TRACT | Status: DC

## 2020-07-20 MED ORDER — PREDNISONE 20 MG PO TABS: 50.0000 mg | ORAL_TABLET | Freq: Every day | ORAL | Status: DC

## 2020-07-20 MED ORDER — SODIUM ZIRCONIUM CYCLOSILICATE 5 G PO PACK
5.0000 g | PACK | Freq: Once | ORAL | Status: AC
  Administered 2020-07-20: 5 g via ORAL
  Filled 2020-07-20: qty 1

## 2020-07-20 MED ORDER — PANTOPRAZOLE SODIUM 40 MG PO TBEC
40.0000 mg | DELAYED_RELEASE_TABLET | Freq: Every day | ORAL | Status: DC
  Administered 2020-07-20 – 2020-07-27 (×8): 40 mg via ORAL
  Filled 2020-07-20 (×8): qty 1

## 2020-07-20 MED ORDER — METHYLPREDNISOLONE SODIUM SUCC 40 MG IJ SOLR: 0.5000 mg/kg | Freq: Two times a day (BID) | INTRAMUSCULAR | Status: DC

## 2020-07-20 MED ORDER — ASPIRIN EC 81 MG PO TBEC
81.0000 mg | DELAYED_RELEASE_TABLET | Freq: Every day | ORAL | Status: DC
  Administered 2020-07-20 – 2020-07-27 (×8): 81 mg via ORAL
  Filled 2020-07-20 (×8): qty 1

## 2020-07-20 MED ORDER — METHYLPREDNISOLONE SODIUM SUCC 125 MG IJ SOLR
60.0000 mg | Freq: Two times a day (BID) | INTRAMUSCULAR | Status: DC
  Administered 2020-07-20 – 2020-07-25 (×10): 60 mg via INTRAVENOUS
  Filled 2020-07-20 (×10): qty 2

## 2020-07-20 MED ORDER — SODIUM CHLORIDE 0.9 % IV SOLN: 100.0000 mL | INTRAVENOUS | Status: DC | PRN

## 2020-07-20 MED ORDER — SODIUM ZIRCONIUM CYCLOSILICATE 10 G PO PACK
20.0000 g | PACK | Freq: Once | ORAL | Status: AC
  Administered 2020-07-20: 20 g via ORAL
  Filled 2020-07-20: qty 2

## 2020-07-20 MED ORDER — FLUTICASONE FUROATE-VILANTEROL 100-25 MCG/INH IN AEPB
1.0000 | INHALATION_SPRAY | Freq: Every day | RESPIRATORY_TRACT | Status: DC
  Administered 2020-07-21 – 2020-07-27 (×6): 1 via RESPIRATORY_TRACT
  Filled 2020-07-20 (×2): qty 28

## 2020-07-20 MED ORDER — PENTAFLUOROPROP-TETRAFLUOROETH EX AERO: 1.0000 "application " | INHALATION_SPRAY | CUTANEOUS | Status: DC | PRN

## 2020-07-20 MED ORDER — UMECLIDINIUM BROMIDE 62.5 MCG/INH IN AEPB
1.0000 | INHALATION_SPRAY | Freq: Every day | RESPIRATORY_TRACT | Status: DC
  Administered 2020-07-20 – 2020-07-27 (×7): 1 via RESPIRATORY_TRACT
  Filled 2020-07-20: qty 7

## 2020-07-20 MED ORDER — INSULIN ASPART 100 UNIT/ML ~~LOC~~ SOLN
0.0000 [IU] | Freq: Three times a day (TID) | SUBCUTANEOUS | Status: DC
  Administered 2020-07-20: 2 [IU] via SUBCUTANEOUS
  Administered 2020-07-21 – 2020-07-23 (×3): 1 [IU] via SUBCUTANEOUS
  Administered 2020-07-23: 2 [IU] via SUBCUTANEOUS
  Administered 2020-07-23 – 2020-07-24 (×2): 1 [IU] via SUBCUTANEOUS
  Administered 2020-07-25 (×3): 2 [IU] via SUBCUTANEOUS
  Administered 2020-07-26 – 2020-07-27 (×3): 1 [IU] via SUBCUTANEOUS

## 2020-07-20 MED ORDER — HEPARIN SODIUM (PORCINE) 1000 UNIT/ML DIALYSIS: 1000.0000 [IU] | INTRAMUSCULAR | Status: DC | PRN

## 2020-07-20 MED ORDER — SODIUM CHLORIDE 0.9% FLUSH: 10.0000 mL | INTRAVENOUS | Status: DC | PRN

## 2020-07-20 MED ORDER — DEXTROSE 50 % IV SOLN
1.0000 | Freq: Once | INTRAVENOUS | Status: AC
  Administered 2020-07-20: 50 mL via INTRAVENOUS
  Filled 2020-07-20: qty 50

## 2020-07-20 MED ORDER — SODIUM CHLORIDE 0.9 % IV SOLN
100.0000 mg | Freq: Every day | INTRAVENOUS | Status: AC
  Administered 2020-07-21 – 2020-07-24 (×4): 100 mg via INTRAVENOUS
  Filled 2020-07-20 (×4): qty 20

## 2020-07-20 MED ORDER — INSULIN ASPART 100 UNIT/ML ~~LOC~~ SOLN
5.0000 [IU] | Freq: Once | SUBCUTANEOUS | Status: AC
  Administered 2020-07-20: 5 [IU] via INTRAVENOUS

## 2020-07-20 MED ORDER — ASCORBIC ACID 500 MG PO TABS
500.0000 mg | ORAL_TABLET | Freq: Every day | ORAL | Status: DC
  Administered 2020-07-20 – 2020-07-27 (×8): 500 mg via ORAL
  Filled 2020-07-20 (×8): qty 1

## 2020-07-20 MED ORDER — INSULIN ASPART 100 UNIT/ML IV SOLN
5.0000 [IU] | Freq: Once | INTRAVENOUS | Status: AC
  Administered 2020-07-20: 5 [IU] via INTRAVENOUS

## 2020-07-20 MED ORDER — ZINC SULFATE 220 (50 ZN) MG PO CAPS
220.0000 mg | ORAL_CAPSULE | Freq: Every day | ORAL | Status: DC
  Administered 2020-07-20 – 2020-07-27 (×8): 220 mg via ORAL
  Filled 2020-07-20 (×8): qty 1

## 2020-07-20 MED ORDER — ALTEPLASE 2 MG IJ SOLR: 2.0000 mg | Freq: Once | INTRAMUSCULAR | Status: DC | PRN

## 2020-07-20 MED ORDER — SODIUM CHLORIDE 0.9 % IV SOLN: 100.0000 mg | Freq: Every day | INTRAVENOUS | Status: DC

## 2020-07-20 MED ORDER — SODIUM CHLORIDE 0.9% FLUSH
10.0000 mL | Freq: Two times a day (BID) | INTRAVENOUS | Status: DC
  Administered 2020-07-20: 10 mL

## 2020-07-20 MED ORDER — GUAIFENESIN-DM 100-10 MG/5ML PO SYRP
10.0000 mL | ORAL_SOLUTION | ORAL | Status: DC | PRN
  Administered 2020-07-20 – 2020-07-26 (×7): 10 mL via ORAL
  Filled 2020-07-20 (×7): qty 10

## 2020-07-20 MED ORDER — DEXTROSE 10 % IV SOLN: INTRAVENOUS | Status: DC

## 2020-07-20 MED ORDER — NITROGLYCERIN 0.4 MG SL SUBL: 0.4000 mg | SUBLINGUAL_TABLET | SUBLINGUAL | Status: DC | PRN

## 2020-07-20 MED ORDER — AMIODARONE HCL 200 MG PO TABS
200.0000 mg | ORAL_TABLET | Freq: Every day | ORAL | Status: DC
  Administered 2020-07-20 – 2020-07-27 (×8): 200 mg via ORAL
  Filled 2020-07-20 (×8): qty 1

## 2020-07-20 MED ORDER — ALBUMIN HUMAN 25 % IV SOLN
INTRAVENOUS | Status: AC
  Administered 2020-07-21: 50 g
  Filled 2020-07-20: qty 100

## 2020-07-20 MED ORDER — CALCIUM GLUCONATE 10 % IV SOLN
1.0000 g | Freq: Once | INTRAVENOUS | Status: AC
  Administered 2020-07-20: 1 g via INTRAVENOUS
  Filled 2020-07-20: qty 10

## 2020-07-20 MED ORDER — MIDODRINE HCL 5 MG PO TABS
10.0000 mg | ORAL_TABLET | Freq: Three times a day (TID) | ORAL | Status: DC
  Administered 2020-07-20 – 2020-07-27 (×22): 10 mg via ORAL
  Filled 2020-07-20 (×25): qty 2

## 2020-07-20 MED ORDER — LIDOCAINE-PRILOCAINE 2.5-2.5 % EX CREA
1.0000 "application " | TOPICAL_CREAM | CUTANEOUS | Status: DC | PRN
  Filled 2020-07-20: qty 5

## 2020-07-20 MED ORDER — FLUTICASONE-UMECLIDIN-VILANT 100-62.5-25 MCG/INH IN AEPB: 1.0000 | INHALATION_SPRAY | Freq: Every day | RESPIRATORY_TRACT | Status: DC | PRN

## 2020-07-20 MED ORDER — FERRIC CITRATE 1 GM 210 MG(FE) PO TABS
210.0000 mg | ORAL_TABLET | Freq: Two times a day (BID) | ORAL | Status: DC
  Administered 2020-07-20 – 2020-07-27 (×13): 210 mg via ORAL
  Filled 2020-07-20 (×13): qty 1

## 2020-07-20 MED ORDER — DEXTROSE 10 % IV SOLN: Freq: Once | INTRAVENOUS | Status: AC

## 2020-07-20 MED ORDER — ALBUTEROL SULFATE HFA 108 (90 BASE) MCG/ACT IN AERS
2.0000 | INHALATION_SPRAY | Freq: Four times a day (QID) | RESPIRATORY_TRACT | Status: DC | PRN
  Administered 2020-07-20: 2 via RESPIRATORY_TRACT
  Filled 2020-07-20: qty 6.7

## 2020-07-20 NOTE — ED Notes (Signed)
Pt admitted to [phone]; report called to [facility], [place].

## 2020-07-20 NOTE — TOC Initial Note (Signed)
Transition of Care [facility]) - Initial/Assessment Note    Patient Details  Name: [name] MRN: [id number] Date of Birth: [birth year]  [facility]) CM/SW Contact:    Benard Halsted, LCSW Phone Number: 07/20/2020, 11:15 AM  Clinical Narrative:                 Patient arrives with an extremely high hospital readmission score. Patient resides at home with a hx of ESRD on HD and is currently COVID+. Patient recently had a rehab stay at [facility] at the end of September. TOC to continue to follow for discharge needs.     Barriers to Discharge: Continued Medical Work up   Patient Goals and CMS Choice        Expected Discharge Plan and Services         Living arrangements for the past 2 months: Single Family Home                                      Prior Living Arrangements/Services Living arrangements for the past 2 months: Single Family Home Lives with:: Adult Children Patient language and need for interpreter reviewed:: Yes        Need for Family Participation in Patient Care: No (Comment) Care giver support system in place?: Yes (comment) Current home services: DME Criminal Activity/Legal Involvement Pertinent to Current Situation/Hospitalization: No - Comment as needed  Activities of Daily Living Home Assistive Devices/Equipment: Walker (specify type) ADL Screening (condition at time of admission) Patient's cognitive ability adequate to safely complete daily activities?: Yes Is the patient deaf or have difficulty hearing?: No Does the patient have difficulty seeing, even when wearing glasses/contacts?: No Does the patient have difficulty concentrating, remembering, or making decisions?: No Patient able to express need for assistance with ADLs?: Yes Does the patient have difficulty dressing or bathing?: No Independently performs ADLs?: Yes (appropriate for developmental age) Does the patient have difficulty walking or climbing stairs?:  Yes Weakness of Legs: Both Weakness of Arms/Hands: None  Permission Sought/Granted                  Emotional Assessment       Orientation: : Oriented to Self,Oriented to Place,Oriented to  Time,Oriented to Situation Alcohol / Substance Use: Not Applicable Psych Involvement: No (comment)  Admission diagnosis:  Hyperkalemia [E87.5] ESRD (end stage renal disease) ([place]) [N18.6] Acute respiratory failure with hypoxia ([place]) [J96.01] Pneumonia due to COVID-19 virus [U07.1, J12.82] Patient Active Problem List   Diagnosis Date Noted  . Acute respiratory failure due to COVID-19 ([place]) 07/20/2020  . Acute respiratory failure with hypoxia ([place]) 07/20/2020  . Allergy, unspecified, sequela 06/28/2020  . Personal history of anaphylaxis 06/28/2020  . Recurrent falls 04/25/2020  . Fall 04/24/2020  . ESRD (end stage renal disease) on dialysis ([place])   . Essential hypertension   . Dyslipidemia   . DM (diabetes mellitus), type 2 with renal complications ([place])   . Class 2 obesity with body mass index (BMI) of 35 to 39.9 without comorbidity   . OSA (obstructive sleep apnea)   . COPD (chronic obstructive pulmonary disease) ([place])   . PAF (paroxysmal atrial fibrillation) ([place])   . Acute respiratory distress 03/09/2020  . Fluid overload, unspecified 03/09/2020  . Acute hyperkalemia 03/06/2020  . GERD with esophagitis 03/06/2020  . Acute bacterial bronchitis 03/06/2020  . Anemia in chronic kidney disease 11/17/2019  .  Coagulation defect, unspecified ([place]) 11/17/2019  . Pain, unspecified 11/17/2019  . Pruritus, unspecified 11/17/2019  . Secondary hyperparathyroidism of renal origin ([place]) 11/17/2019  . Unspecified protein-calorie malnutrition ([place]) 11/17/2019  . Loss of weight 10/22/2019  . Goals of care, counseling/discussion 07/23/2019  . 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 07/21/2019  . Paroxysmal atrial fibrillation ([place]) 07/21/2019  . Encephalopathy 07/21/2019  . Moderate major  depression, single episode ([place]) 07/21/2019  . Pressure injury of skin 07/20/2019  . Anorexia 07/10/2019  . Weight gain with edema 07/02/2019  . 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 Neuropathy due to type 2 diabetes mellitus ([place]) 06/22/2019  . Mixed diabetic hyperlipidemia associated with type 2 diabetes mellitus ([place]) 06/22/2019  . Chronic gout 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 06/22/2019  . Quality of life palliative care encounter 06/19/2019  . Atrial fibrillation with RVR ([place])   . Atrial flutter with rapid ventricular response ([place]) 06/16/2019  . Dyspnea and respiratory abnormalities 06/01/2019  . Acute on chronic respiratory failure with hypoxia and hypercapnia with respiratory acidosis 06/01/2019  . Anasarca 05/31/2019  . Nausea without vomiting 05/31/2019  . Generalized abdominal pain 05/31/2019  . History of colonic polyps 05/31/2019  . Lymphedema of both lower extremities 04/28/2019  . Cellulitis of finger of right hand 08/19/2018  . Chronic respiratory failure with hypoxia ([place]) 07/13/2018  . Acute renal failure superimposed on stage 3 chronic kidney disease ([place]) 07/13/2018  . Chronic diastolic CHF (congestive heart failure) ([place]) 07/13/2018  . COPD (chronic obstructive pulmonary disease) ([place]) 07/13/2018  . Flatulence 09/18/2017  . IBS (irritable bowel syndrome) 04/03/2017  . Acute on chronic diastolic CHF (congestive heart failure) ([place]) 05/08/2016  . COPD with acute exacerbation ([place]) 05/08/2016  . Constipation 04/25/2016  . Peripheral vertigo 06/27/2015  . Port-A-Cath in place 10/06/2014  . Chronic obstructive pulmonary disease ([place]) 04/30/2014  . Obesity, Class III, BMI 40-49.9 (morbid obesity) ([place]) 04/30/2014  . Hemorrhoids, internal, with bleeding 11/10/2013  . Anemia 08/18/2013  . Other diseases of tongue 08/18/2013  . Chest pain 09/05/2012  .  Arthropathy 12/09/2011  . Edema 12/09/2011  . Gout 12/09/2011  . Hiatal hernia with GERD and esophagitis   . Obstructive chronic bronchitis without exacerbation ([place]) 12/26/2009  . Dysphagia 12/26/2009  . DM type 2 with diabetic peripheral neuropathy ([place] [address]) 12/25/2009  . Vitamin B deficiency 12/25/2009  . Iron deficiency anemia due to chronic blood loss 12/25/2009  . Benign essential HTN 12/25/2009  . DEGENERATIVE DISC DISEASE 12/25/2009  . OSA (obstructive sleep apnea) 12/25/2009  . HLD (hyperlipidemia) 12/25/2009  . Type 2 diabetes mellitus with ESRD (end-stage renal disease) ([place]) 12/25/2009  . Narrowing of intervertebral disc space 12/25/2009   PCP:  [name], MD Pharmacy:   [facility] - [address], [place] [address] [place] [address] Phone: [phone] Fax: [phone]  [facility] DR[facility] STORE [place], [place] AT [place] [address] Phone: [phone] Fax: [phone]  [id number] [place], [place] [address] [place] [address] Phone: [phone] Fax: [phone]     Social Determinants of Health (SDOH) Interventions    Readmission Risk Interventions Readmission Risk Prevention Plan 07/20/2020 04/28/2020 03/08/2020  Transportation Screening Complete  Complete Complete  PCP or Specialist Appt within 3-5 Days - Not Complete -  Not Complete comments - going to SNF -  [place] or [place] - Complete -  [place] or Home Care Consult comments - - -  Social Work Consult for [place] Planning/Counseling - Complete -  Palliative Care Screening - Not Applicable -  Medication Review (RN Care Manager) Referral to Pharmacy Complete Complete  PCP or Specialist appointment within 3-5 days of discharge Complete - Complete  PCP/Specialist  Appt Not Complete comments - - -  [place] or Home Care Consult Complete - Complete  SW Recovery Care/Counseling Consult Complete - Complete  Palliative Care Screening Not Applicable - Not [place] Not Applicable - Not Applicable  Some recent data might be hidden

## 2020-07-20 NOTE — Progress Notes (Signed)
Pt seen in room and examined, pt is not in distress.  EKG's done for ^K+ show mild QRS widening around 100- 110 msec which is normal for her.  Repeat labs were ordered this am x 2 around 10 am but haven't been drawn, have called lab and d/w RN. Pt is on deck for HD this evening at around 6 pm.    Kelly Splinter, MD 07/20/2020, 2:33 PM

## 2020-07-20 NOTE — Progress Notes (Signed)
Dr. Tana Coast reached out to Dr. Melvia Heaps who will place orders.  She also emphasized the need to stay on top of dialysis so she gets her tx at 1800

## 2020-07-20 NOTE — H&P (Signed)
History and Physical    [name] XNA:[id number] DOB: 03-Aug-1946 DOA: 07/19/2020  PCP: [name], MD  Patient coming from: Home.  Chief Complaint: Shortness of breath.  HPI: [name] is a 74 y.o. female with history of ESRD on hemodialysis, diabetes mellitus type 2, paroxysmal atrial fibrillation, gout has been experiencing worsening shortness of breath with productive cough generalized weakness and vomiting and diarrhea for the last week.  Patient has missed her dialysis almost for last 2 sessions because patient states there was some change in the schedule and when she went last Monday about 3 days ago she had a long wait time and had to go back home.  Patient states her family members at home has been diagnosed with Covid infection.  Patient has been having productive cough denies chest pain has been having some diarrhea.  ED Course: In the ER patient was hypoxic requiring 4 L oxygen with chest x-ray showing bilateral infiltrates Covid test was positive.  The labs are significant for hyperkalemia.  EKG shows peaked T waves and patient was given [facility] and nephrology consulted.  Patient was hypoglycemic in the ER for which patient was started on D10.  Patient admitted for acute respiratory failure with hypoxia likely combination of fluid overload and Covid infection with hyperglycemia.  Review of Systems: As per HPI, rest all negative.   Past Medical History:  Diagnosis Date   Blood transfusion without reported diagnosis    CAD (coronary artery disease)    Nonobstructive at cardiac catheterization 2000   Cataract    Cervical cancer ([place]) 1978   CHF (congestive heart failure) (HCC)    Chronic back pain    Chronic kidney disease    MWF dialysis   Class 2 obesity with body mass index (BMI) of 35 to 39.9 without comorbidity    COPD (chronic obstructive pulmonary disease) (HCC)    Degenerative disc disease    DM (diabetes mellitus), type 2 with  renal complications (HCC)    Dyslipidemia    Dysrhythmia    a-fib   ESRD (end stage renal disease) on dialysis [facility])    Essential hypertension    Essential hypertension, benign    GERD (gastroesophageal reflux disease)    Gout    Hiatal hernia 07/27/2013   History of diverticulitis of colon    History of hiatal hernia    Iron deficiency anemia    Irritable bowel syndrome    Lumbar radiculopathy    Mixed hyperlipidemia    Moderate major depression, single episode ([place]) 07/21/2019   Neuropathy    OSA (obstructive sleep apnea)    Osteoporosis    Ovarian cancer ([place]) 1978   patient denies. States this was cervical cancer   Oxygen deficiency    room air   PAF (paroxysmal atrial fibrillation) ([address])    Sleep apnea    Type 2 diabetes mellitus ([place])    Vitamin B deficiency 12/25/2009   Vitamin B12 deficiency     Past Surgical History:  Procedure Laterality Date   ABDOMINAL HYSTERECTOMY     AV FISTULA PLACEMENT Left 08/05/2019   Procedure: ARTERIOVENOUS (AV) FISTULA CREATION LEFT ARM;  Surgeon: Waynetta Sandy, MD;  Location: [place];  Service: Vascular;  Laterality: Left;   [place] Left 10/05/2019   Procedure: SECOND STAGE LEFT BASCILIC VEIN TRANSPOSITION;  Surgeon: Waynetta Sandy, MD;  Location: [place];  Service: Vascular;  Laterality: Left;   Benign  breast cysts     CHOLECYSTECTOMY     COLONOSCOPY  10/01/2006   SLF:Pan colonic diverticulosis and moderate internal hemorrhoids/ Otherwise no polyps, masses, inflammatory changes or AVMs/   COLONOSCOPY  2011   SLF: pancolonic diverticulosis, large internal hemorrhoids   COLONOSCOPY N/A 01/26/2016   Procedure: COLONOSCOPY;  Surgeon: [name], MD;  [facility];  Service: Endoscopy;  Laterality: N/A;  830    COLONOSCOPY WITH PROPOFOL N/A 02/10/2020   Procedure: COLONOSCOPY WITH PROPOFOL;  Surgeon: Daneil Dolin, MD;  Location: [facility]  ENDO SUITE;  Service: Endoscopy;  Laterality: N/A;  10:45am   ESOPHAGOGASTRODUODENOSCOPY  11/19/2006   SLF: Large hiatal hernia without evidence of Cameron ulcers/. Distal esophageal stricture, which allowed the gastroscope to pass without resistance.  A 16 mm Savary later passed with mild resistance/ Normal stomach.sb bx negative   ESOPHAGOGASTRODUODENOSCOPY  10/01/2006   FOY:DXAJO hiatal hernia.  Distal esophagus without evidence of   erythema, ulceration or Barrett's esophagus   ESOPHAGOGASTRODUODENOSCOPY  2011   SLF: large hh, distal esophageal web narrowing to 41mm s/p dilation to 82mm   ESOPHAGOGASTRODUODENOSCOPY N/A 08/06/2013   SLF: 1. Stricture at the gastroesophageal junction 2. large hiatal hernia. 3. Mild erosive gastritis.   ESOPHAGOGASTRODUODENOSCOPY (EGD) WITH PROPOFOL N/A 07/19/2019   rourk: Mild erosive reflux esophagitis.  Mild Schatzki ring status post dilation.  Large hiatal hernia with at least one half of the stomach above the diaphragm.  Gastric mucosa erythematous.   GIVENS CAPSULE STUDY N/A 08/06/2013   INCOMPLETE-SMALL BOWLE ULCERS   IR FLUORO GUIDE CV LINE LEFT  07/29/2019   IR US GUIDE VASC ACCESS LEFT  07/29/2019   KNEE SURGERY Right    PARTIAL HYSTERECTOMY  1978   small bowel capsule  2008   negative   SPINE SURGERY     TONSILLECTOMY AND ADENOIDECTOMY     Two back surgeries/fusion     UMBILICAL HERNIA REPAIR  2010     reports that she quit smoking about 58 years ago. Her smoking use included cigarettes. She started smoking about 59 years ago. She has a 1.00 pack-year smoking history. She has never used smokeless tobacco. She reports that she does not drink alcohol and does not use drugs.  No Known Allergies  Family History  Problem Relation Age of Onset   Colon cancer Brother        diagnosed age 57. Living.    Ulcers Sister    Diabetes Sister    Heart attack Sister    Kidney failure Sister    Stroke Sister    Ulcers Mother     Diabetes Mother    Heart attack Mother    Stroke Mother    Asthma Mother    Heart disease Mother    Cervical cancer Mother    Heart attack Brother    Heart disease Brother    Asthma Sister    Diabetes Brother    Stroke Maternal Grandmother    Heart attack Maternal Grandmother    Heart attack Other    Early death Father        MVA in his 22s    Prior to Admission medications   Medication Sig Start Date End Date Taking? Authorizing Provider  acetaminophen (TYLENOL) 325 MG tablet Take 650 mg by mouth daily as needed for headache (pain).    [provider]  acetaminophen (TYLENOL) 500 MG tablet Take 500-1,000 mg by mouth every 6 (six) hours as needed (for pain.).  [provider]  albuterol (PROVENTIL) (2.5 MG/3ML) 0.083% nebulizer solution Inhale 3 mLs into the lungs daily as needed for wheezing or shortness of breath. 12/22/19   [provider]  albuterol (PROVENTIL) (2.5 MG/3ML) 0.083% nebulizer solution Take 2.5 mg by nebulization 4 (four) times daily as needed for wheezing or shortness of breath.  12/22/19   [provider]  albuterol (VENTOLIN HFA) 108 (90 Base) MCG/ACT inhaler Inhale 2 puffs into the lungs every 6 (six) hours as needed for wheezing or shortness of breath.  04/09/19   [provider]  albuterol (VENTOLIN HFA) 108 (90 Base) MCG/ACT inhaler Inhale 2 puffs into the lungs every 6 (six) hours as needed for wheezing or shortness of breath.  11/16/19   [provider]  allopurinol (ZYLOPRIM) 100 MG tablet Take 1 tablet (100 mg total) by mouth 2 (two) times daily. 08/09/19   Nita Sells, MD  allopurinol (ZYLOPRIM) 100 MG tablet Take 100 mg by mouth 2 (two) times daily. 03/02/20   [provider]  amiodarone (PACERONE) 200 MG tablet Take 1 tablet (200 mg total) by mouth daily. 08/10/19   Nita Sells, MD  amiodarone (PACERONE) 200 MG tablet Take 200 mg by mouth daily. 03/02/20   [provider]  apixaban (ELIQUIS) 2.5 MG TABS tablet Take 2.5 mg by mouth 2 (two) times daily.    [provider]  apixaban (ELIQUIS) 2.5 MG TABS tablet Take 2.5 mg by mouth 2 (two) times daily.    [provider]  aspirin EC 81 MG tablet Take 81 mg by mouth daily.    [provider]  aspirin EC 81 MG tablet Take 81 mg by mouth daily. Swallow whole.    [provider]  atorvastatin (LIPITOR) 40 MG tablet Take 1 tablet (40 mg total) by mouth at bedtime. 05/04/18   Satira Sark, MD  atorvastatin (LIPITOR) 40 MG tablet Take 40 mg by mouth at bedtime. 03/02/20   [provider]  Biotin 5000 MCG TABS Take 5,000 mcg by mouth daily.    [provider]  Biotin 5000 MCG TABS Take 5,000 mcg by mouth daily.    [provider]  Carboxymethylcellul-Glycerin (CLEAR EYES FOR DRY EYES) 1-0.25 % SOLN Place 1 drop into both eyes daily as needed (dry eys).    [provider]  Carboxymethylcellul-Glycerin (CLEAR EYES FOR DRY EYES) 1-0.25 % SOLN Place 1 drop into both eyes daily as needed (dry eyes).    [provider]  diclofenac Sodium (VOLTAREN) 1 % GEL Apply 2 g topically 4 (four) times daily. 04/27/20   Geradine Girt, DO  esomeprazole (NEXIUM) 40 MG capsule Take 40 mg by mouth daily. 12/06/19   [provider]  esomeprazole (NEXIUM) 40 MG capsule Take 40 mg by mouth daily. 03/08/20   [provider]  famotidine (PEPCID) 40 MG tablet TAKE 1 TABLET AT BEDTIME AS NEEDED TO CONTROL REFLUX Patient taking differently: Take 40 mg by mouth at bedtime as needed for heartburn.  06/23/15   Mannam, Hart Robinsons, MD  famotidine (PEPCID) 40 MG tablet Take 40 mg by mouth at bedtime. 03/02/20   [provider]  ferric citrate (AURYXIA) 1 GM 210 MG(Fe) tablet Take 210 mg by mouth 2 (two) times daily with a meal.    [provider]  Fluticasone-Umeclidin-Vilant (TRELEGY ELLIPTA) 100-62.5-25 MCG/INH AEPB Inhale 1 puff  into the lungs daily as needed (shortness of breath/wheezing).    [provider]  gabapentin (NEURONTIN) 100  MG capsule Take 100 mg by mouth at bedtime. 12/29/19   [provider]  gabapentin (NEURONTIN) 100 MG capsule Take 100 mg by mouth at bedtime. 04/18/20   [provider]  insulin lispro (HUMALOG) 100 UNIT/ML injection Inject 5-16 Units into the skin 3 (three) times daily as needed (greater than 120).     [provider]  LANTUS SOLOSTAR 100 UNIT/ML SOPN Inject 10 Units into the skin daily as needed (over 120 blood sugars).  10/06/12   [provider]  lidocaine (LIDODERM) 5 % Place 1 patch onto the skin daily. Remove & Discard patch within 12 hours or as directed by MD Patient taking differently: Place 1 patch onto the skin daily as needed (pain.). Remove & Discard patch within 12 hours or as directed by MD 11/21/19   Garald Balding, PA-C  lidocaine (LIDODERM) 5 % Place 1 patch onto the skin daily. 11/21/19   [provider]  midodrine (PROAMATINE) 10 MG tablet Take 1 tablet (10 mg total) by mouth 3 (three) times daily with meals. 04/28/20   Geradine Girt, DO  nitroGLYCERIN (NITROSTAT) 0.4 MG SL tablet Place 0.4 mg under the tongue every 5 (five) minutes x 3 doses as needed for chest pain. 12/07/19   [provider]  nitroGLYCERIN (NITROSTAT) 0.4 MG SL tablet Place 0.4 mg under the tongue every 5 (five) minutes as needed for chest pain.  12/07/19   [provider]  ondansetron (ZOFRAN) 4 MG tablet Take 4 mg by mouth daily as needed for nausea/vomiting. 12/29/19   [provider]  ondansetron (ZOFRAN) 4 MG tablet Take 4 mg by mouth every 8 (eight) hours as needed for nausea or vomiting.  04/04/20   [provider]  OXYGEN Inhale 2 L into the lungs as needed (shortness of breath).    [provider]  PRESCRIPTION MEDICATION Inhale into the lungs at bedtime. CPAP    [provider]  tamsulosin  (FLOMAX) 0.4 MG CAPS capsule Take 1 capsule (0.4 mg total) by mouth daily. 07/08/19   Johnson, Clanford L, MD  tamsulosin (FLOMAX) 0.4 MG CAPS capsule Take 0.4 mg by mouth daily. 03/02/20   [provider]  traMADol (ULTRAM) 50 MG tablet Take 1 tablet (50 mg total) by mouth every 8 (eight) hours as needed (pain). 04/27/20   Eulogio Bear U, DO  TRELEGY ELLIPTA 100-62.5-25 MCG/INH AEPB Inhale 1 puff into the lungs daily as needed (respiratory issues.).     [provider]    Physical Exam: Constitutional: Moderately built and nourished. Vitals:   07/20/20 0256 07/20/20 0300 07/20/20 0400 07/20/20 0500  BP: (!) 128/32 (!) 147/63 (!) 144/56 137/63  Pulse: 92 76 72 70  Resp: 19 17 13 19   Temp:   98.3 F (36.8 C)   TempSrc:   Oral   SpO2: 100% 100% 100% 100%  Weight:      Height:       Eyes: Anicteric no pallor. ENMT: No discharge from the ears eyes nose or mouth. Neck: No mass felt.  No neck rigidity. Respiratory: No rhonchi or crepitations. Cardiovascular: S1-S2 heard. Abdomen: Soft nontender bowel sounds present. Musculoskeletal: No edema. Skin: No rash. Neurologic: Alert awake oriented to time place and person.  Moves all extremities. Psychiatric: Appears normal.  Normal affect.   Labs on Admission: I have personally reviewed following labs and imaging studies  CBC: Recent Labs  Lab 07/19/20 2254  WBC 5.2  HGB 13.0  HCT 41.9  MCV 97.7  PLT 341   Basic Metabolic Panel: Recent Labs  Lab 07/19/20 2254  NA 141  K 6.8*  CL 102  CO2 18*  GLUCOSE 76  BUN 57*  CREATININE 12.99*  CALCIUM 6.3*   GFR: Estimated Creatinine Clearance: 3.1 mL/min (A) (by C-G formula based on SCr of 12.99 mg/dL (H)). Liver Function Tests: No results for input(s): AST, ALT, ALKPHOS, BILITOT, PROT, ALBUMIN in the last 168 hours. No results for input(s): LIPASE, AMYLASE in the last 168 hours. No results for input(s): AMMONIA in the last 168 hours. Coagulation Profile: No  results for input(s): INR, PROTIME in the last 168 hours. Cardiac Enzymes: No results for input(s): CKTOTAL, CKMB, CKMBINDEX, TROPONINI in the last 168 hours. BNP (last 3 results) No results for input(s): PROBNP in the last 8760 hours. HbA1C: No results for input(s): HGBA1C in the last 72 hours. CBG: Recent Labs  Lab 07/20/20 0230 07/20/20 0357 07/20/20 0502  GLUCAP 65* 86 62*   Lipid Profile: No results for input(s): CHOL, HDL, LDLCALC, TRIG, CHOLHDL, LDLDIRECT in the last 72 hours. Thyroid Function Tests: No results for input(s): TSH, T4TOTAL, FREET4, T3FREE, THYROIDAB in the last 72 hours. Anemia Panel: No results for input(s): VITAMINB12, FOLATE, FERRITIN, TIBC, IRON, RETICCTPCT in the last 72 hours. Urine analysis:    Component Value Date/Time   COLORURINE YELLOW 04/24/2020 1528   APPEARANCEUR HAZY (A) 04/24/2020 1528   LABSPEC 1.013 04/24/2020 1528   PHURINE 7.0 04/24/2020 1528   GLUCOSEU NEGATIVE 04/24/2020 1528   HGBUR NEGATIVE 04/24/2020 1528   BILIRUBINUR NEGATIVE 04/24/2020 1528   KETONESUR NEGATIVE 04/24/2020 1528   PROTEINUR 100 (A) 04/24/2020 1528   UROBILINOGEN 0.2 01/29/2007 1500   NITRITE NEGATIVE 04/24/2020 [place] 04/24/2020 1528   Sepsis Labs: @LABRCNTIP (procalcitonin:4,lacticidven:4) ) Recent Results (from the past 240 hour(s))  Resp Panel by RT-PCR (Flu A&B, Covid) Nasopharyngeal Swab     Status: Abnormal   Collection Time: 07/19/20 11:02 PM   Specimen: Nasopharyngeal Swab; Nasopharyngeal(NP) swabs in vial transport medium  Result Value Ref Range Status   SARS Coronavirus 2 by RT PCR POSITIVE (A) NEGATIVE Final    Comment: RESULT CALLED TO, READ BACK BY AND VERIFIED WITH: C CRISCO RN 07/20/20 0047 (NOTE) SARS-CoV-2 target nucleic acids are DETECTED.  The SARS-CoV-2 RNA is generally detectable in upper respiratory specimens during the acute phase of infection. Positive results are indicative of the presence of the  identified virus, but do not rule out bacterial infection or co-infection with other pathogens not detected by the test. Clinical correlation with patient history and other diagnostic information is necessary to determine patient infection status. The expected result is Negative.  Fact Sheet for Patients: EntrepreneurPulse.com.au  Fact Sheet for Healthcare Providers: IncredibleEmployment.be  This test is not yet approved or cleared by the [place] FDA and  has been authorized for detection and/or diagnosis of SARS-CoV-2 by FDA under an Emergency Use Authorization (EUA).  This EUA will remain in effect (meaning this test can be used) f or the duration of  the COVID-19 declaration under Section 564(b)(1) of the Act, 21 U.S.C. section 360bbb-3(b)(1), unless the authorization is terminated or revoked sooner.     Influenza A by PCR NEGATIVE NEGATIVE Final   Influenza B by PCR NEGATIVE NEGATIVE Final    Comment: (NOTE) The Xpert Xpress SARS-CoV-2/FLU/RSV plus assay is intended as an aid in the diagnosis of influenza from Nasopharyngeal swab specimens and should not be used as a sole basis  for treatment. Nasal washings and aspirates are unacceptable for Xpert Xpress SARS-CoV-2/FLU/RSV testing.  Fact Sheet for Patients: EntrepreneurPulse.com.au  Fact Sheet for Healthcare Providers: IncredibleEmployment.be  This test is not yet approved or cleared by the [place] FDA and has been authorized for detection and/or diagnosis of SARS-CoV-2 by FDA under an Emergency Use Authorization (EUA). This EUA will remain in effect (meaning this test can be used) for the duration of the COVID-19 declaration under Section 564(b)(1) of the Act, 21 U.S.C. section 360bbb-3(b)(1), unless the authorization is terminated or revoked.  Performed at [facility], [place] [address], [place], [place] [address]       Radiological Exams on Admission: DG Chest Portable 1 View  Result Date: 07/19/2020 CLINICAL DATA:  Cough for 2 weeks.  Possible COVID. EXAM: PORTABLE CHEST 1 VIEW COMPARISON:  Radiograph 04/24/2020, CT 07/17/2006 FINDINGS: Left-sided dialysis catheter and right chest port remain in place. Lower lung volumes from prior exam. Stable heart size and mediastinal contours. Right pericardiac density corresponds to hiatal hernia on prior CT. Minimal patchy opacity at both lung bases. No significant pleural effusion. No pneumothorax. Vascular stent in the left upper arm. IMPRESSION: Minimal patchy opacity at both lung bases, may represent atelectasis or pneumonia. Electronically Signed   By: Keith Rake M.D.   On: 07/19/2020 23:28    EKG: Independently reviewed.  Normal sinus rhythm peaked T waves.  Assessment/Plan Active Problems:   DM type 2 with diabetic peripheral neuropathy (HCC)   Benign essential HTN   OSA (obstructive sleep apnea)   HLD (hyperlipidemia)   Chronic obstructive pulmonary disease (HCC)   ESRD (end stage renal disease) on dialysis (HCC)   PAF (paroxysmal atrial fibrillation) (HCC)   Acute respiratory failure due to COVID-19 [facility])   Acute respiratory failure with hypoxia ([place])    1. Acute respiratory failure with hypoxia likely a combination of Covid infection and fluid overload. 2. Covid pneumonia for which patient has consented to start remdesivir and also Solu-Medrol and also agreed for baricitinib and Actemra if required.  Inflammatory markers are pending.  Closely monitor respiratory status. 3. Fluid overload with missed dialysis and hyperkalemia for which nephrology has been consulted.  Appreciate nephrology consult.  Patient did receive Lokelma and is scheduled to get dialysis. 4. Hypoglycemia likely in the setting of not eating well with Covid infection with vomiting diarrhea.  Follow CBGs closely.  At this time patient is on D10. 5. Paroxysmal atrial fibrillation  presently rate controlled on sinus rhythm on apixaban.  Patient takes amiodarone. 6. Mild hypotension on midodrine. 7. History of gout on allopurinol. 8. Sleep apnea presently having Covid infection. 9. 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  Given that patient is acute respiratory failure with hypoxia presently on 4 L oxygen with chest x-ray showing bilateral infiltrates Covid test positive hyperkalemia will need close monitoring for any further worsening in inpatient status.   DVT prophylaxis: Apixaban. Code Status: Full code. Family Communication: Discussed with patient. Disposition Plan: Home. Consults called: Nephrology. Admission status: Inpatient   [name] MD [facility] Pager [phone].  If 7PM-7AM, please contact night-coverage [email or web address] Password [facility]  07/20/2020, 5:46 AM

## 2020-07-20 NOTE — ED Provider Notes (Signed)
[facility] EMERGENCY DEPARTMENT Provider Note   CSN: [id number] Arrival date & time: 07/19/20  1858     History Chief Complaint  Patient presents with   Cough   Level 5 caveat due to acuity of condition  [name] is a 74 y.o. female.  The history is provided by the patient.  Cough Severity:  Moderate Onset quality:  Gradual Duration: " Several days" Timing:  Intermittent Progression:  Worsening Chronicity:  New Relieved by:  Nothing Worsened by:  Nothing Associated symptoms: chills, fever and shortness of breath   Patient with extensive history including ESRD on dialysis, COPD, diabetes, paroxysmal atrial fibrillation on anticoagulation presents with cough for the past several days.  Patient with cough, fever and chills.  She also ports increasing shortness of breath.      Past Medical History:  Diagnosis Date   Blood transfusion without reported diagnosis    CAD (coronary artery disease)    Nonobstructive at cardiac catheterization 2000   Cataract    Cervical cancer ([place]) 1978   CHF (congestive heart failure) (HCC)    Chronic back pain    Chronic kidney disease    MWF dialysis   Class 2 obesity with body mass index (BMI) of 35 to 39.9 without comorbidity    COPD (chronic obstructive pulmonary disease) (HCC)    Degenerative disc disease    DM (diabetes mellitus), type 2 with renal complications (HCC)    Dyslipidemia    Dysrhythmia    a-fib   ESRD (end stage renal disease) on dialysis [facility])    Essential hypertension    Essential hypertension, benign    GERD (gastroesophageal reflux disease)    Gout    Hiatal hernia 07/27/2013   History of diverticulitis of colon    History of hiatal hernia    Iron deficiency anemia    Irritable bowel syndrome    Lumbar radiculopathy    Mixed hyperlipidemia    Moderate major depression, single episode ([place]) 07/21/2019   Neuropathy    OSA (obstructive sleep apnea)     Osteoporosis    Ovarian cancer ([place]) 1978   patient denies. States this was cervical cancer   Oxygen deficiency    room air   PAF (paroxysmal atrial fibrillation) (HCC)    Sleep apnea    Type 2 diabetes mellitus ([place])    Vitamin B deficiency 12/25/2009   Vitamin B12 deficiency     Patient Active Problem List   Diagnosis Date Noted   Allergy, unspecified, sequela 06/28/2020   Personal history of anaphylaxis 06/28/2020   Recurrent falls 04/25/2020   Fall 04/24/2020   ESRD (end stage renal disease) on dialysis [facility])    Essential hypertension    Dyslipidemia    DM (diabetes mellitus), type 2 with renal complications (HCC)    Class 2 obesity with body mass index (BMI) of 35 to 39.9 without comorbidity    OSA (obstructive sleep apnea)    COPD (chronic obstructive pulmonary disease) (HCC)    PAF (paroxysmal atrial fibrillation) ([place])    Acute respiratory distress 03/09/2020   Fluid overload, unspecified 03/09/2020   Acute hyperkalemia 03/06/2020   GERD with esophagitis 03/06/2020   Acute bacterial bronchitis 03/06/2020   Anemia in chronic kidney disease 11/17/2019   Coagulation defect, unspecified ([place]) 11/17/2019   Pain, unspecified 11/17/2019   Pruritus, unspecified 11/17/2019   Secondary hyperparathyroidism of renal origin ([place]) 11/17/2019   Unspecified protein-calorie malnutrition ([place]) 11/17/2019   Loss of weight 10/22/2019  Goals of care, counseling/discussion 07/23/2019   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 07/21/2019   Paroxysmal atrial fibrillation ([place]) 07/21/2019   Encephalopathy 07/21/2019   Moderate major depression, single episode ([place]) 07/21/2019   Pressure injury of skin 07/20/2019   Anorexia 07/10/2019   Weight gain with edema 07/02/2019   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 ([place]) 06/22/2019   Neuropathy due to type 2 diabetes  mellitus ([place]) 06/22/2019   Mixed diabetic hyperlipidemia associated with type 2 diabetes mellitus ([place]) 06/22/2019   Chronic gout 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 06/22/2019   Quality of life palliative care encounter 06/19/2019   Atrial fibrillation with RVR (HCC)    Atrial flutter with rapid ventricular response ([place]) 06/16/2019   Dyspnea and respiratory abnormalities 06/01/2019   Acute on chronic respiratory failure with hypoxia and hypercapnia with respiratory acidosis 06/01/2019   Anasarca 05/31/2019   Nausea without vomiting 05/31/2019   Generalized abdominal pain 05/31/2019   History of colonic polyps 05/31/2019   Lymphedema of both lower extremities 04/28/2019   Cellulitis of finger of right hand 08/19/2018   Chronic respiratory failure with hypoxia ([place]) 07/13/2018   Acute renal failure superimposed on stage 3 chronic kidney disease ([place]) 07/13/2018   Chronic diastolic CHF (congestive heart failure) ([place]) 07/13/2018   COPD (chronic obstructive pulmonary disease) ([place]) 07/13/2018   Flatulence 09/18/2017   IBS (irritable bowel syndrome) 04/03/2017   Acute on chronic diastolic CHF (congestive heart failure) ([place]) 05/08/2016   COPD with acute exacerbation ([place]) 05/08/2016   Constipation 04/25/2016   Peripheral vertigo 06/27/2015   Port-A-Cath in place 10/06/2014   Chronic obstructive pulmonary disease ([place]) 04/30/2014   Obesity, Class III, BMI 40-49.9 (morbid obesity) ([place]) 04/30/2014   Hemorrhoids, internal, with bleeding 11/10/2013   Anemia 08/18/2013   Other diseases of tongue 08/18/2013   Chest pain 09/05/2012   Arthropathy 12/09/2011   Edema 12/09/2011   Gout 12/09/2011   Hiatal hernia with GERD and esophagitis    Obstructive chronic bronchitis without exacerbation ([place]) 12/26/2009   Dysphagia 12/26/2009   DM type 2 with diabetic peripheral neuropathy ([place]) 12/25/2009   Vitamin B deficiency 12/25/2009   Iron deficiency  anemia due to chronic blood loss 12/25/2009   Benign essential HTN 12/25/2009   DEGENERATIVE [place] DISEASE 12/25/2009   OSA (obstructive sleep apnea) 12/25/2009   HLD (hyperlipidemia) 12/25/2009   Type 2 diabetes mellitus with ESRD (end-stage renal disease) ([place]) 12/25/2009   Narrowing of intervertebral disc space 12/25/2009    Past Surgical History:  Procedure Laterality Date   ABDOMINAL HYSTERECTOMY     AV FISTULA PLACEMENT Left 08/05/2019   Procedure: ARTERIOVENOUS (AV) FISTULA CREATION LEFT ARM;  Surgeon: Waynetta Sandy, MD;  Location: [facility];  Service: Vascular;  Laterality: Left;   BACK SURGERY     BASCILIC VEIN TRANSPOSITION Left 10/05/2019   Procedure: SECOND STAGE LEFT [place];  Surgeon: Waynetta Sandy, MD;  Location: [facility];  Service: Vascular;  Laterality: Left;   Benign breast cysts     CHOLECYSTECTOMY     COLONOSCOPY  10/01/2006   SLF:Pan colonic diverticulosis and moderate internal hemorrhoids/ Otherwise no polyps, masses, inflammatory changes or AVMs/   COLONOSCOPY  2011   SLF: pancolonic diverticulosis, large internal hemorrhoids   COLONOSCOPY N/A 01/26/2016   Procedure: COLONOSCOPY;  Surgeon: [name], MD;  [facility];  Service: Endoscopy;  Laterality: N/A;  830    COLONOSCOPY WITH  PROPOFOL N/A 02/10/2020   Procedure: COLONOSCOPY WITH PROPOFOL;  Surgeon: Daneil Dolin, MD;  [facility];  Service: Endoscopy;  Laterality: N/A;  10:45am   ESOPHAGOGASTRODUODENOSCOPY  11/19/2006   SLF: Large hiatal hernia without evidence of [place] ulcers/. Distal esophageal stricture, which allowed the gastroscope to pass without resistance.  A 16 mm Savary later passed with mild resistance/ Normal stomach.sb bx negative   ESOPHAGOGASTRODUODENOSCOPY  10/01/2006   CMK:LKJZP hiatal hernia.  Distal esophagus without evidence of   erythema, ulceration or Barrett's esophagus   ESOPHAGOGASTRODUODENOSCOPY   2011   SLF: large hh, distal esophageal web narrowing to 55mm s/p dilation to 86mm   ESOPHAGOGASTRODUODENOSCOPY N/A 08/06/2013   SLF: 1. Stricture at the gastroesophageal junction 2. large hiatal hernia. 3. Mild erosive gastritis.   ESOPHAGOGASTRODUODENOSCOPY (EGD) WITH PROPOFOL N/A 07/19/2019   rourk: Mild erosive reflux esophagitis.  Mild Schatzki ring status post dilation.  Large hiatal hernia with at least one half of the stomach above the diaphragm.  Gastric mucosa erythematous.   GIVENS CAPSULE STUDY N/A 08/06/2013   INCOMPLETE-SMALL BOWLE ULCERS   IR FLUORO GUIDE CV LINE LEFT  07/29/2019   IR US GUIDE VASC ACCESS LEFT  07/29/2019   KNEE SURGERY Right    PARTIAL HYSTERECTOMY  1978   small bowel capsule  2008   negative   SPINE SURGERY     TONSILLECTOMY AND ADENOIDECTOMY     Two back surgeries/fusion     UMBILICAL HERNIA REPAIR  2010     OB History   No obstetric history on file.     Family History  Problem Relation Age of Onset   Colon cancer Brother        diagnosed age 5. Living.    Ulcers Sister    Diabetes Sister    Heart attack Sister    Kidney failure Sister    Stroke Sister    Ulcers Mother    Diabetes Mother    Heart attack Mother    Stroke Mother    Asthma Mother    Heart disease Mother    Cervical cancer Mother    Heart attack Brother    Heart disease Brother    Asthma Sister    Diabetes Brother    Stroke Maternal Grandmother    Heart attack Maternal Grandmother    Heart attack Other    Early death Father        MVA in his 26s    Social History   Tobacco Use   Smoking status: Former Smoker    Packs/day: 1.00    Years: 1.00    Pack years: 1.00    Types: Cigarettes    Start date: 02/19/1961    Quit date: 08/05/1961    Years since quitting: 58.9   Smokeless tobacco: Never Used   Tobacco comment: 1 year in her lifetime  Vaping Use   Vaping Use: Never used  Substance Use Topics   Alcohol use: Never   Drug  use: Never    Home Medications Prior to Admission medications   Medication Sig Start Date End Date Taking? Authorizing Provider  acetaminophen (TYLENOL) 325 MG tablet Take 650 mg by mouth daily as needed for headache (pain).    [provider]  acetaminophen (TYLENOL) 500 MG tablet Take 500-1,000 mg by mouth every 6 (six) hours as needed (for pain.).    [provider]  albuterol (PROVENTIL) (2.5 MG/3ML) 0.083% nebulizer solution Inhale 3 mLs into the lungs daily as needed  for wheezing or shortness of breath. 12/22/19   [provider]  albuterol (PROVENTIL) (2.5 MG/3ML) 0.083% nebulizer solution Take 2.5 mg by nebulization 4 (four) times daily as needed for wheezing or shortness of breath.  12/22/19   [provider]  albuterol (VENTOLIN HFA) 108 (90 Base) MCG/ACT inhaler Inhale 2 puffs into the lungs every 6 (six) hours as needed for wheezing or shortness of breath.  04/09/19   [provider]  albuterol (VENTOLIN HFA) 108 (90 Base) MCG/ACT inhaler Inhale 2 puffs into the lungs every 6 (six) hours as needed for wheezing or shortness of breath.  11/16/19   [provider]  allopurinol (ZYLOPRIM) 100 MG tablet Take 1 tablet (100 mg total) by mouth 2 (two) times daily. 08/09/19   Nita Sells, MD  allopurinol (ZYLOPRIM) 100 MG tablet Take 100 mg by mouth 2 (two) times daily. 03/02/20   [provider]  amiodarone (PACERONE) 200 MG tablet Take 1 tablet (200 mg total) by mouth daily. 08/10/19   Nita Sells, MD  amiodarone (PACERONE) 200 MG tablet Take 200 mg by mouth daily. 03/02/20   [provider]  apixaban (ELIQUIS) 2.5 MG TABS tablet Take 2.5 mg by mouth 2 (two) times daily.    [provider]  apixaban (ELIQUIS) 2.5 MG TABS tablet Take 2.5 mg by mouth 2 (two) times daily.    [provider]  aspirin EC 81 MG tablet Take 81 mg by mouth daily.    [provider]  aspirin EC 81 MG tablet Take  81 mg by mouth daily. Swallow whole.    [provider]  atorvastatin (LIPITOR) 40 MG tablet Take 1 tablet (40 mg total) by mouth at bedtime. 05/04/18   Satira Sark, MD  atorvastatin (LIPITOR) 40 MG tablet Take 40 mg by mouth at bedtime. 03/02/20   [provider]  Biotin 5000 MCG TABS Take 5,000 mcg by mouth daily.    [provider]  Biotin 5000 MCG TABS Take 5,000 mcg by mouth daily.    [provider]  Carboxymethylcellul-Glycerin (CLEAR EYES FOR DRY EYES) 1-0.25 % SOLN Place 1 drop into both eyes daily as needed (dry eys).    [provider]  Carboxymethylcellul-Glycerin (CLEAR EYES FOR DRY EYES) 1-0.25 % SOLN Place 1 drop into both eyes daily as needed (dry eyes).    [provider]  diclofenac Sodium (VOLTAREN) 1 % GEL Apply 2 g topically 4 (four) times daily. 04/27/20   Geradine Girt, DO  esomeprazole (NEXIUM) 40 MG capsule Take 40 mg by mouth daily. 12/06/19   [provider]  esomeprazole (NEXIUM) 40 MG capsule Take 40 mg by mouth daily. 03/08/20   [provider]  famotidine (PEPCID) 40 MG tablet TAKE 1 TABLET AT BEDTIME AS NEEDED TO CONTROL REFLUX Patient taking differently: Take 40 mg by mouth at bedtime as needed for heartburn.  06/23/15   Mannam, Hart Robinsons, MD  famotidine (PEPCID) 40 MG tablet Take 40 mg by mouth at bedtime. 03/02/20   [provider]  ferric citrate (AURYXIA) 1 GM 210 MG(Fe) tablet Take 210 mg by mouth 2 (two) times daily with a meal.    [provider]  Fluticasone-Umeclidin-Vilant (TRELEGY ELLIPTA) 100-62.5-25 MCG/INH AEPB Inhale 1 puff into the lungs daily as needed (shortness of breath/wheezing).    [provider]  gabapentin (NEURONTIN) 100 MG capsule Take 100 mg by mouth at bedtime. 12/29/19   [provider]  gabapentin (NEURONTIN) 100 MG capsule  Take 100 mg by mouth at bedtime. 04/18/20   [provider]  insulin lispro (HUMALOG) 100 UNIT/ML  injection Inject 5-16 Units into the skin 3 (three) times daily as needed (greater than 120).     [provider]  LANTUS SOLOSTAR 100 UNIT/ML SOPN Inject 10 Units into the skin daily as needed (over 120 blood sugars).  10/06/12   [provider]  lidocaine (LIDODERM) 5 % Place 1 patch onto the skin daily. Remove & Discard patch within 12 hours or as directed by MD Patient taking differently: Place 1 patch onto the skin daily as needed (pain.). Remove & Discard patch within 12 hours or as directed by MD 11/21/19   Garald Balding, PA-C  lidocaine (LIDODERM) 5 % Place 1 patch onto the skin daily. 11/21/19   [provider]  midodrine (PROAMATINE) 10 MG tablet Take 1 tablet (10 mg total) by mouth 3 (three) times daily with meals. 04/28/20   Geradine Girt, DO  nitroGLYCERIN (NITROSTAT) 0.4 MG SL tablet Place 0.4 mg under the tongue every 5 (five) minutes x 3 doses as needed for chest pain. 12/07/19   [provider]  nitroGLYCERIN (NITROSTAT) 0.4 MG SL tablet Place 0.4 mg under the tongue every 5 (five) minutes as needed for chest pain.  12/07/19   [provider]  ondansetron (ZOFRAN) 4 MG tablet Take 4 mg by mouth daily as needed for nausea/vomiting. 12/29/19   [provider]  ondansetron (ZOFRAN) 4 MG tablet Take 4 mg by mouth every 8 (eight) hours as needed for nausea or vomiting.  04/04/20   [provider]  OXYGEN Inhale 2 L into the lungs as needed (shortness of breath).    [provider]  PRESCRIPTION MEDICATION Inhale into the lungs at bedtime. CPAP    [provider]  tamsulosin (FLOMAX) 0.4 MG CAPS capsule Take 1 capsule (0.4 mg total) by mouth daily. 07/08/19   Johnson, Clanford L, MD  tamsulosin (FLOMAX) 0.4 MG CAPS capsule Take 0.4 mg by mouth daily. 03/02/20   [provider]  traMADol (ULTRAM) 50 MG tablet Take 1 tablet (50 mg total) by mouth every 8 (eight) hours as needed (pain). 04/27/20   Eulogio Bear U,  DO  TRELEGY ELLIPTA 100-62.5-25 MCG/INH AEPB Inhale 1 puff into the lungs daily as needed (respiratory issues.).     [provider]    Allergies    Patient has no known allergies.  Review of Systems   Review of Systems  Unable to perform ROS: Acuity of condition  Constitutional: Positive for chills and fever.  Respiratory: Positive for cough and shortness of breath.     Physical Exam Updated Vital Signs BP (!) 149/55    Pulse 87    Temp 98.5 F (36.9 C)    Resp 18    Ht 1.422 m (4\' 8" )    Wt 76.7 kg    LMP  (LMP Unknown)    SpO2 95%    BMI 37.89 kg/m   Physical Exam CONSTITUTIONAL: Elderly and ill-appearing HEAD: Normocephalic/atraumatic EYES: EOMI/PERRL ENMT: Mask in place NECK: supple no meningeal signs SPINE/BACK:entire spine nontender CV: S1/S2 noted LUNGS: Crackles bilaterally, tachypnea ABDOMEN: soft, nontender, no rebound or guarding, bowel sounds noted throughout abdomen GU:no cva tenderness NEURO: Pt is awake/alert/appropriate, moves all extremitiesx4.  No facial droop.   EXTREMITIES: pulses normal/equal, full ROM SKIN: warm, color normal, dialysis catheter in left chest PSYCH: Unable to assess  ED Results / Procedures /  Treatments   Labs (all labs ordered are listed, but only abnormal results are displayed) Labs Reviewed  RESP PANEL BY RT-PCR (FLU A&B, COVID) ARPGX2 - Abnormal; Notable for the following components:      Result Value   SARS Coronavirus 2 by RT PCR POSITIVE (*)    All other components within normal limits  BASIC METABOLIC PANEL - Abnormal; Notable for the following components:   Potassium 6.8 (*)    CO2 18 (*)    BUN 57 (*)    Creatinine, Ser 12.99 (*)    Calcium 6.3 (*)    GFR, Estimated 3 (*)    Anion gap 21 (*)    All other components within normal limits  CBG MONITORING, ED - Abnormal; Notable for the following components:   Glucose-Capillary 65 (*)    All other components within normal limits  CBC    EKG EKG  Interpretation  Date/Time:  Thursday July 20 2020 01:18:12 EST Ventricular Rate:  72 PR Interval:    QRS Duration: 111 QT Interval:  424 QTC Calculation: 464 R Axis:     Text Interpretation: Sinus rhythm Incomplete left bundle branch block Low voltage, extremity leads Anterior Q waves, possibly due to ILBBB Baseline wander in lead(s) V3 V4 V6 Confirmed by Ripley Fraise [phone]) on 07/20/2020 1:21:25 AM   Radiology DG Chest Portable 1 View  Result Date: 07/19/2020 CLINICAL DATA:  Cough for 2 weeks.  Possible COVID. EXAM: PORTABLE CHEST 1 VIEW COMPARISON:  Radiograph 04/24/2020, CT 07/17/2006 FINDINGS: Left-sided dialysis catheter and right chest port remain in place. Lower lung volumes from prior exam. Stable heart size and mediastinal contours. Right pericardiac density corresponds to hiatal hernia on prior CT. Minimal patchy opacity at both lung bases. No significant pleural effusion. No pneumothorax. Vascular stent in the left upper arm. IMPRESSION: Minimal patchy opacity at both lung bases, may represent atelectasis or pneumonia. Electronically Signed   By: Keith Rake M.D.   On: 07/19/2020 23:28    Procedures .Critical Care Performed by: Ripley Fraise, MD Authorized by: Ripley Fraise, MD   Critical care provider statement:    Critical care time (minutes):  80   Critical care start time:  07/20/2020 2:00 AM   Critical care end time:  07/20/2020 3:20 AM   Critical care time was exclusive of:  Separately billable procedures and treating other patients   Critical care was necessary to treat or prevent imminent or life-threatening deterioration of the following conditions:  Respiratory failure, renal failure and metabolic crisis   Critical care was time spent personally by me on the following activities:  Ordering and performing treatments and interventions, ordering and review of laboratory studies, ordering and review of radiographic studies, re-evaluation of patient's  condition, pulse oximetry, discussions with consultants, development of treatment plan with patient or surrogate, evaluation of patient's response to treatment, examination of patient and review of old charts   I assumed direction of critical care for this patient from another provider in my specialty: no     Care discussed with: admitting provider       Medications Ordered in ED Medications  remdesivir 200 mg in sodium chloride 0.9% 250 mL IVPB (200 mg Intravenous New Bag/Given 07/20/20 0329)    Followed by  remdesivir 100 mg in sodium chloride 0.9 % 100 mL IVPB (has no administration in time range)  sodium zirconium cyclosilicate (LOKELMA) packet 5 g (has no administration in time range)  Chlorhexidine Gluconate Cloth 2 % PADS 6 each (  has no administration in time range)  midodrine (PROAMATINE) tablet 10 mg (has no administration in time range)  sodium chloride flush (NS) 0.9 % injection 10-40 mL (10 mLs Intracatheter Given 07/20/20 0323)  sodium chloride flush (NS) 0.9 % injection 10-40 mL (has no administration in time range)  calcium gluconate inj 10% (1 g) URGENT USE ONLY! (1 g Intravenous Given 07/20/20 0329)  dextrose 10 % infusion ( Intravenous New Bag/Given 07/20/20 0335)  insulin aspart (novoLOG) injection 5 Units (5 Units Intravenous Given 07/20/20 0340)    ED Course  I have reviewed the triage vital signs and the nursing notes.  Pertinent labs & imaging results that were available during my care of the patient were reviewed by me and considered in my medical decision making (see chart for details).    MDM Rules/Calculators/A&P                          2:11 AM Patient positive for COVID-19.  She reports she is double vaccinated.  Patient is tachypneic, but at this point does not have hypoxia.  She is overall ill-appearing.  She also has hyperkalemia without evidence of any acute EKG changes.  I have discussed the case with nephrology Dr. Augustin Coupe He will arrange dialysis.   Patient will need to be given medications here to temporize the hyperkalemia.  Patient reports it has been several days since her last dialysis session. Her request to have attempted to call family, but there is been no response via phone Calcium has been ordered for hyperkalemia.  Patient would also benefit from insulin but her initial glucose was 76.  Will give dextrose then provide insulin. 3:55 AM I checked on patient multiple times.  She was a difficult IV access.  Initially considered placing an external jugular IV.  However it was noted the patient does have a port in her right chest.  Her IV team has evaluated this, and was able to place IV access in the port as well to her right hand. Patient has been given remdesivir for COVID-19.  She has been given calcium for hyperkalemia.  She has been taking oral juice is been given dextrose infusion.  She has been given 5 units insulin IV to help shift the potassium  Patient is currently awake alert at this time. Discussed with Dr. Hal Hope for admission.  She has also been seen by nephrology Dr. Augustin Coupe Final Clinical Impression(s) / ED Diagnoses Final diagnoses:  Pneumonia due to COVID-19 virus  Hyperkalemia  ESRD (end stage renal disease) [facility])    Rx / DC Orders ED Discharge Orders    None       Ripley Fraise, MD 07/20/20 [phone]

## 2020-07-20 NOTE — Progress Notes (Signed)
[facility]                                                                              Patient Demographics  [name], is a 74 y.o. female, DOB - May 08, 1946, ZHY:[id number]  Admit date - 07/19/2020   Admitting Physician [name], MD  Outpatient Primary MD for the patient is [name], MD  Outpatient specialists:   LOS - 0  days   Medical records reviewed and are as summarized below:    Chief Complaint  Patient presents with   Cough       Brief summary   Patient is a 74 year old female with history of ESRD on HD, MWF, DM type II, paroxysmal A. fib, gout had been experiencing worsening shortness of breath with productive cough generalized weakness and vomiting and diarrhea for the last week.  Patient has missed her dialysis almost for last 2 sessions because patient states there was some change in the schedule and when she went last Monday about 3 days ago she had a long wait time and had to go back home.  Patient reported that her family members at home has been diagnosed with Covid infection.  Patient has been having productive cough, and diarrhea.  Denies chest pain. In ED, was hypoxic requiring 4 L O2.  Chest x-ray showed bilateral infiltrates.  Covid test positive. Labs significant for hyperkalemia, EKG with peaked T waves, received Lokelma and nephrology was consulted. In ED, patient was hypoglycemic, was placed on D10.  Admitted for acute respiratory failure with hypoxia due to Covid infection, fluid overload due to missed HD.     Assessment & Plan    Acute hypoxic respiratory failure due to acute COVID-19 viral pneumonia during the ongoing COVID-19 pandemic- POA - Patient presented with productive cough, generalized weakness, nausea vomiting diarrhea, shortness of breath for a week.  Chest x-ray showed bilateral infiltrates.  Likely has combination of fluid overload due to missed hemodialysis x2 and COVID-19 - Currently  hypoxic, requiring 4 L nasal cannula, at baseline occasionally needs O2 - Continue, remdesivir per pharmacy protocol.   - Continue Supportive care: vitamin C/zinc, albuterol, Tylenol. 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 -In ED, was hypoglycemic, likely due to insulin for hyperkalemia, started on D10 -CBGs still borderline, continue D10 drip   Hypokalemia, hypocalcemia -Received Lokelma in ED, calcium gluconate, insulin, D10 -Repeat labs today    Benign essential HTN -BP currently stable, was placed on midodrine by nephrology  ESRD on HD, MWF -Missed last 2 sessions, likely fluid overload -Nephrology consulted, will need HD today  Anemia of  chronic disease, secondary to ESRD -H&H currently stable, no ESA needed at present  Paroxysmal atrial fibrillation -Rate currently controlled, continue amiodarone - continue Eliquis  GERD -Continue PPI    Chronic obstructive pulmonary disease (HCC) -Continue IV steroids, Incruse Ellipta, Ventolin inhaler -No acute wheezing  Pressure injury, POA -Sacrum with stage II, wound care per nursing  Obesity Estimated body mass index is 37.89 kg/m as calculated from the following:   Height as of this encounter: 4\' 8"  (1.422 m).   Weight as of this encounter: 76.7 kg.  - will need lifestyle changes and diet control  Code Status: Full CODE STATUS DVT Prophylaxis: Eliquis Family Communication: Discussed all imaging results, lab results, explained to the patient    Disposition Plan:     Status is: Inpatient  Remains inpatient appropriate because:Inpatient level of care appropriate due to severity of illness   Dispo: The patient is from: Home              Anticipated d/c is to: Home              Anticipated d/c date is: > 3 days              Patient currently is not medically stable to d/c.  Hypoxia, active Covid infection, needs hemodialysis for fluid overload      Time Spent in minutes 64mins   Procedures:  None  Consultants:   *Nephrology  Antimicrobials:   Anti-infectives (From admission, onward)   Start     Dose/Rate Route Frequency Ordered Stop   07/21/20 1000  remdesivir 100 mg in sodium chloride 0.9 % 100 mL IVPB       "Followed by" Linked Group Details   100 mg 200 mL/hr over 30 Minutes Intravenous Daily 07/20/20 0142 07/25/20 0959   07/21/20 1000  remdesivir 100 mg in sodium chloride 0.9 % 100 mL IVPB  Status:  Discontinued       "Followed by" Linked Group Details   100 mg 200 mL/hr over 30 Minutes Intravenous Daily 07/20/20 0546 07/20/20 0551   07/20/20 0600  remdesivir 200 mg in sodium chloride 0.9% 250 mL IVPB  Status:  Discontinued       "Followed by" Linked Group Details   200 mg 580 mL/hr over 30 Minutes Intravenous Once 07/20/20 0546 07/20/20 0551   07/20/20 0230  remdesivir 200 mg in sodium chloride 0.9% 250 mL IVPB       "Followed by" Linked Group Details   200 mg 580 mL/hr over 30 Minutes Intravenous Once 07/20/20 0142 07/20/20 0402          Medications  Scheduled Meds:  allopurinol  100 mg Oral BID   amiodarone  200 mg Oral Daily   apixaban  2.5 mg Oral BID   vitamin C  500 mg Oral Daily   aspirin EC  81 mg Oral Daily   atorvastatin  40 mg Oral QHS   Chlorhexidine Gluconate Cloth  6 each Topical Q0600   ferric citrate  210 mg Oral BID WC   fluticasone furoate-vilanterol  1 puff Inhalation Daily   gabapentin  100 mg Oral QHS   methylPREDNISolone (SOLU-MEDROL) injection  0.5 mg/kg  Intravenous Q12H   Followed by   Derrill Memo ON 07/23/2020] predniSONE  50 mg Oral Daily   midodrine  10 mg Oral TID WC   pantoprazole  40 mg Oral Daily   umeclidinium bromide  1 puff Inhalation Daily   zinc sulfate  220 mg Oral Daily  Continuous Infusions:  dextrose 75 mL/hr at 07/20/20 0509   [START ON 07/21/2020] remdesivir 100 mg in NS 100 mL     PRN Meds:.albuterol, chlorpheniramine-HYDROcodone, guaiFENesin-dextromethorphan, nitroGLYCERIN, sodium chloride flush, traMADol      Subjective:   Erine Phenix was seen and examined today.  Somewhat sleepy and lethargic, still has coughing and shortness of breath, on 4 L O2 via nasal cannula. Patient denies chest pain, abdominal pain, N/V/D/C, new weakness, numbess, tingling. No fevers  Objective:   Vitals:   07/20/20 0300 07/20/20 0400 07/20/20 0500 07/20/20 0617  BP: (!) 147/63 (!) 144/56 137/63 130/62  Pulse: 76 72 70 73  Resp: 17 13 19 20   Temp:  98.3 F (36.8 C)  98.3 F (36.8 C)  TempSrc:  Oral    SpO2: 100% 100% 100% (!) 88%  Weight:      Height:        Intake/Output Summary (Last 24 hours) at 07/20/2020 1003 Last data filed at 07/20/2020 0900 Gross per 24 hour  Intake 0 ml  Output --  Net 0 ml     Wt Readings from Last 3 Encounters:  07/19/20 76.7 kg  04/28/20 78.3 kg  04/07/20 75.3 kg     Exam  General: Sleepy but easily arousable and oriented, NAD  Cardiovascular: S1 S2 auscultated, no murmurs, RRR  Respiratory: Decreased breath sound at the bases, no wheezing  Gastrointestinal: Soft, nontender, nondistended, + bowel sounds  Ext: no pedal edema bilaterally  Neuro: no new deficits  Musculoskeletal: No digital cyanosis, clubbing  Skin: No rashes  Psych: Normal affect and demeanor   Data Reviewed:  I have personally reviewed following labs and imaging studies  Micro Results Recent Results (from the past 240 hour(s))  Resp Panel by RT-PCR (Flu A&B, Covid) Nasopharyngeal Swab      Status: Abnormal   Collection Time: 07/19/20 11:02 PM   Specimen: Nasopharyngeal Swab; Nasopharyngeal(NP) swabs in vial transport medium  Result Value Ref Range Status   SARS Coronavirus 2 by RT PCR POSITIVE (A) NEGATIVE Final    Comment: RESULT CALLED TO, READ BACK BY AND VERIFIED WITH: C CRISCO RN 07/20/20 0047 (NOTE) SARS-CoV-2 target nucleic acids are DETECTED.  The SARS-CoV-2 RNA is generally detectable in upper respiratory specimens during the acute phase of infection. Positive results are indicative of the presence of the identified virus, but do not rule out bacterial infection or co-infection with other pathogens not detected by the test. Clinical correlation with patient history and other diagnostic information is necessary to determine patient infection status. The expected result is Negative.  Fact Sheet for Patients: EntrepreneurPulse.com.au  Fact Sheet for Healthcare Providers: IncredibleEmployment.be  This test is not yet approved or cleared by the [place] FDA and  has been authorized for detection and/or diagnosis of SARS-CoV-2 by FDA under an Emergency Use Authorization (EUA).  This EUA will remain in effect (meaning this test can be used) f or the duration of  the COVID-19 declaration under Section 564(b)(1) of the Act, 21 U.S.C. section 360bbb-3(b)(1), unless the authorization is terminated or revoked sooner.     Influenza A by PCR NEGATIVE NEGATIVE Final   Influenza B by PCR NEGATIVE NEGATIVE Final    Comment: (NOTE) The Xpert Xpress SARS-CoV-2/FLU/RSV plus assay is intended as an aid in the diagnosis of influenza from Nasopharyngeal swab specimens and should not be used as a sole basis for treatment. Nasal washings and aspirates are unacceptable for Xpert Xpress SARS-CoV-2/FLU/RSV testing.  Fact Sheet for Patients:  EntrepreneurPulse.com.au  Fact Sheet for Healthcare  Providers: IncredibleEmployment.be  This test is not yet approved or cleared by the [place] FDA and has been authorized for detection and/or diagnosis of SARS-CoV-2 by FDA under an Emergency Use Authorization (EUA). This EUA will remain in effect (meaning this test can be used) for the duration of the COVID-19 declaration under Section 564(b)(1) of the Act, 21 U.S.C. section 360bbb-3(b)(1), unless the authorization is terminated or revoked.  Performed at [facility], [place] [address] [place], [place] [address]     Radiology Reports DG Chest Portable 1 View  Result Date: 07/19/2020 CLINICAL DATA:  Cough for 2 weeks.  Possible COVID. EXAM: PORTABLE CHEST 1 VIEW COMPARISON:  Radiograph 04/24/2020, CT 07/17/2006 FINDINGS: Left-sided dialysis catheter and right chest port remain in place. Lower lung volumes from prior exam. Stable heart size and mediastinal contours. Right pericardiac density corresponds to hiatal hernia on prior CT. Minimal patchy opacity at both lung bases. No significant pleural effusion. No pneumothorax. Vascular stent in the left upper arm. IMPRESSION: Minimal patchy opacity at both lung bases, may represent atelectasis or pneumonia. Electronically Signed   By: Keith Rake M.D.   On: 07/19/2020 23:28    Lab Data:  CBC: Recent Labs  Lab 07/19/20 2254  WBC 5.2  HGB 13.0  HCT 41.9  MCV 97.7  PLT 983   Basic Metabolic Panel: Recent Labs  Lab 07/19/20 2254  NA 141  K 6.8*  CL 102  CO2 18*  GLUCOSE 76  BUN 57*  CREATININE 12.99*  CALCIUM 6.3*   GFR: Estimated Creatinine Clearance: 3.1 mL/min (A) (by C-G formula based on SCr of 12.99 mg/dL (H)). Liver Function Tests: No results for input(s): AST, ALT, ALKPHOS, BILITOT, PROT, ALBUMIN in the last 168 hours. No results for input(s): LIPASE, AMYLASE in the last 168 hours. No results for input(s): AMMONIA in the last 168 hours. Coagulation Profile: No results for input(s):  INR, PROTIME in the last 168 hours. Cardiac Enzymes: No results for input(s): CKTOTAL, CKMB, CKMBINDEX, TROPONINI in the last 168 hours. BNP (last 3 results) No results for input(s): PROBNP in the last 8760 hours. HbA1C: No results for input(s): HGBA1C in the last 72 hours. CBG: Recent Labs  Lab 07/20/20 0502 07/20/20 0656 07/20/20 0756 07/20/20 0905 07/20/20 0958  GLUCAP 62* 84 94 101* 102*   Lipid Profile: No results for input(s): CHOL, HDL, LDLCALC, TRIG, CHOLHDL, LDLDIRECT in the last 72 hours. Thyroid Function Tests: No results for input(s): TSH, T4TOTAL, FREET4, T3FREE, THYROIDAB in the last 72 hours. Anemia Panel: No results for input(s): VITAMINB12, FOLATE, FERRITIN, TIBC, IRON, RETICCTPCT in the last 72 hours. Urine analysis:    Component Value Date/Time   COLORURINE YELLOW 04/24/2020 1528   APPEARANCEUR HAZY (A) 04/24/2020 1528   LABSPEC 1.013 04/24/2020 1528   PHURINE 7.0 04/24/2020 1528   GLUCOSEU NEGATIVE 04/24/2020 1528   HGBUR NEGATIVE 04/24/2020 1528   BILIRUBINUR NEGATIVE 04/24/2020 1528   KETONESUR NEGATIVE 04/24/2020 1528   PROTEINUR 100 (A) 04/24/2020 1528   UROBILINOGEN 0.2 01/29/2007 1500   NITRITE NEGATIVE 04/24/2020 [place] 04/24/2020 1528     Amillya Chavira M.D. [facility] 07/20/2020, 10:03 AM   Call night coverage person covering after 7pm

## 2020-07-20 NOTE — Progress Notes (Signed)
Sent chat message to Dr. Tana Coast:  Lab just called with confirmed critical lab values:  K > 7.5 and Ca 5.9  We spoke with dialysis, and they can not see her until 6pm.

## 2020-07-20 NOTE — Progress Notes (Signed)
Noted patient to have  Rhythm change on the monitor, EKG done, and noted to MD, Rai. No new orders given at this time, except dialysis asap. Put in new consult for IV team to draw patient labs per Dr. Melvia Heaps request and still waiting on IV team to come draw patients labs at this time. Charge nurse aware of these events. Trying to call dialysis now but phone number is busy.  Asked Dr. Tana Coast why CBG is  Q1, reason unknown at this time.

## 2020-07-20 NOTE — Consult Note (Addendum)
Reason for Consult: ESRD  Referring Physician: Dr. Christy Gentles  Chief Complaint: cough  Dialysis Orders: MWF [facility]  -> [facility] and now @ [facility] 4h 77 kg Profile 4 Hep none 2/2.5 bath TDC/ LUA AVF  Calcitriol 1.75  mircera 75 q 2 venofer 100 Midodrine Last 12/9  Assessment/Plan: 1. COVID -treatment per primary. 2. ESRD: TTS  schedule using the [facility] AVF has been used but she has been infiltrated multiple times and the plan was AVG by [facility]. - Orders written and she will be on 1st shift. - Temporize hyperkalemia medically; no EKG changes.  3. Hypotension - will need  Midodrine before HD (10mg  TID)  4. Anemia of ESRD:hgb over 10- No ESA needed at present.  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 COPD/ OSA 7. Afib on eliquis 8. Gout 9. DM2   HPI: [name] is an 74 y.o. female past medical history significant for HTN, DM, obesity, COPD/OSA, hyperlipidemia, Afib on eliquis, gout as well as ESRD-  HD MWF at [facility].  She has not been feeling well for the past 2 weeks with  Headaches, earaches and sinus issues but in the past few days she developed a  Cough productive of yellowish sputum, fever, myalgias, nausea, vomiting, and diarrhea; she has been living with a person with COVID. She also has dyspnea and anorexia; her last dialysis treatment was last Thursday 12/9 where she left at 77.2kg. Her EDW is 77kg.  Review of Systems  Cardiovascular: Negative for leg swelling.   Pertinent items are noted in HPI.  Chemistry and CBC: Creat  Date/Time Value Ref Range Status  10/10/2017 10:50 AM 1.68 (H) 0.60 - 0.93 mg/dL Final    Comment:    For patients >42 years of age, the reference limit for Creatinine is approximately 13% higher for people identified as African-American. .   07/11/2017 04:19 PM 1.96 (H) 0.60 - 0.93 mg/dL Final    Comment:    For patients >75 years of age, the reference limit for Creatinine is approximately 13% higher for people identified  as African-American. .    Creatinine, Ser  Date/Time Value Ref Range Status  07/19/2020 10:54 PM 12.99 (H) 0.44 - 1.00 mg/dL Final  04/28/2020 08:35 AM 6.00 (H) 0.44 - 1.00 mg/dL Final  04/26/2020 05:14 AM 5.20 (H) 0.44 - 1.00 mg/dL Final    Comment:    DIALYSIS  04/25/2020 02:26 AM 2.74 (H) 0.44 - 1.00 mg/dL Final    Comment:    DELTA CHECK NOTED  04/24/2020 01:12 PM 7.54 (H) 0.44 - 1.00 mg/dL Final  04/07/2020 06:29 PM 3.16 (H) 0.44 - 1.00 mg/dL Final  03/08/2020 04:14 AM 3.82 (H) 0.44 - 1.00 mg/dL Final  03/07/2020 04:57 AM 5.28 (H) 0.44 - 1.00 mg/dL Final    Comment:    DELTA CHECK NOTED  03/06/2020 06:34 AM 9.93 (H) 0.44 - 1.00 mg/dL Final  03/06/2020 02:42 AM 10.80 (H) 0.44 - 1.00 mg/dL Final  03/06/2020 01:43 AM 10.11 (H) 0.44 - 1.00 mg/dL Final  02/10/2020 09:07 AM 4.30 (H) 0.44 - 1.00 mg/dL Final  11/21/2019 12:37 PM 5.29 (H) 0.44 - 1.00 mg/dL Final  10/05/2019 06:51 AM 2.80 (H) 0.44 - 1.00 mg/dL Final  08/11/2019 05:00 AM 2.99 (H) 0.44 - 1.00 mg/dL Final    Comment:    DELTA CHECK NOTED  08/10/2019 04:39 AM 4.56 (H) 0.44 - 1.00 mg/dL Final  08/09/2019 03:24 AM 3.91 (H) 0.44 - 1.00 mg/dL  Final    Comment:    DELTA CHECK NOTED  08/08/2019 03:49 AM 2.43 (H) 0.44 - 1.00 mg/dL Final  08/07/2019 04:44 AM 3.42 (H) 0.44 - 1.00 mg/dL Final    Comment:    DELTA CHECK NOTED  08/06/2019 07:35 AM 1.93 (H) 0.44 - 1.00 mg/dL Final    Comment:    DIALYSIS  08/05/2019 05:02 AM 3.49 (H) 0.44 - 1.00 mg/dL Final    Comment:    DELTA CHECK NOTED  08/04/2019 04:40 AM 2.01 (H) 0.44 - 1.00 mg/dL Final    Comment:    DELTA CHECK NOTED  08/03/2019 05:00 AM 3.29 (H) 0.44 - 1.00 mg/dL Final  08/02/2019 04:15 AM 2.68 (H) 0.44 - 1.00 mg/dL Final    Comment:    DELTA CHECK NOTED  08/01/2019 07:16 AM 4.18 (H) 0.44 - 1.00 mg/dL Final  08/01/2019 06:09 AM 4.35 (H) 0.44 - 1.00 mg/dL Final  07/31/2019 03:38 AM 3.72 (H) 0.44 - 1.00 mg/dL Final    Comment:    DELTA CHECK NOTED  07/30/2019  02:51 AM 2.31 (H) 0.44 - 1.00 mg/dL Final  07/29/2019 03:38 PM 1.79 (H) 0.44 - 1.00 mg/dL Final  07/29/2019 03:00 AM 1.74 (H) 0.44 - 1.00 mg/dL Final  07/28/2019 04:44 PM 1.93 (H) 0.44 - 1.00 mg/dL Final  07/28/2019 04:20 AM 1.99 (H) 0.44 - 1.00 mg/dL Final  07/27/2019 04:00 PM 2.21 (H) 0.44 - 1.00 mg/dL Final  07/27/2019 04:45 AM 2.86 (H) 0.44 - 1.00 mg/dL Final  07/26/2019 06:41 PM 3.71 (H) 0.44 - 1.00 mg/dL Final  07/26/2019 03:52 AM 5.54 (H) 0.44 - 1.00 mg/dL Final  07/25/2019 06:51 PM 7.08 (H) 0.44 - 1.00 mg/dL Final  07/25/2019 02:57 AM 7.98 (H) 0.44 - 1.00 mg/dL Final  07/24/2019 04:24 AM 6.97 (H) 0.44 - 1.00 mg/dL Final  07/23/2019 03:51 AM 6.41 (H) 0.44 - 1.00 mg/dL Final  07/22/2019 03:50 AM 5.55 (H) 0.44 - 1.00 mg/dL Final    Comment:    DELTA CHECK NOTED  07/20/2019 07:22 AM 3.37 (H) 0.44 - 1.00 mg/dL Final  07/18/2019 08:03 AM 3.36 (H) 0.44 - 1.00 mg/dL Final  07/17/2019 09:40 AM 3.76 (H) 0.44 - 1.00 mg/dL Final  07/16/2019 04:30 AM 3.75 (H) 0.44 - 1.00 mg/dL Final  07/08/2019 05:02 AM 2.87 (H) 0.44 - 1.00 mg/dL Final  07/07/2019 04:48 AM 3.12 (H) 0.44 - 1.00 mg/dL Final  07/06/2019 04:47 AM 3.95 (H) 0.44 - 1.00 mg/dL Final  07/05/2019 07:55 PM 4.20 (H) 0.44 - 1.00 mg/dL Final  07/04/2019 11:32 AM 3.31 (H) 0.44 - 1.00 mg/dL Final  06/30/2019 11:05 AM 3.43 (H) 0.44 - 1.00 mg/dL Final  06/28/2019 08:30 AM 3.42 (H) 0.44 - 1.00 mg/dL Final   Recent Labs  Lab 07/19/20 2254  NA 141  K 6.8*  CL 102  CO2 18*  GLUCOSE 76  BUN 57*  CREATININE 12.99*  CALCIUM 6.3*   Recent Labs  Lab 07/19/20 2254  WBC 5.2  HGB 13.0  HCT 41.9  MCV 97.7  PLT 166   Liver Function Tests: No results for input(s): AST, ALT, ALKPHOS, BILITOT, PROT, ALBUMIN in the last 168 hours. No results for input(s): LIPASE, AMYLASE in the last 168 hours. No results for input(s): AMMONIA in the last 168 hours. Cardiac Enzymes: No results for input(s): CKTOTAL, CKMB, CKMBINDEX, TROPONINI in the last  168 hours. Iron Studies: No results for input(s): IRON, TIBC, TRANSFERRIN, FERRITIN in the last 72 hours. PT/INR: @LABRCNTIP (inr:5)  Xrays/Other Studies: ) Results for  orders placed or performed during the hospital encounter of 07/19/20 (from the past 48 hour(s))  Basic metabolic panel     Status: Abnormal   Collection Time: 07/19/20 10:54 PM  Result Value Ref Range   Sodium 141 135 - 145 mmol/L   Potassium 6.8 (HH) 3.5 - 5.1 mmol/L    Comment: CRITICAL RESULT CALLED TO, READ BACK BY AND VERIFIED WITH: CHRISCO C,RN 07/19/20 2358 WAYK    Chloride 102 98 - 111 mmol/L   CO2 18 (L) 22 - 32 mmol/L   Glucose, Bld 76 70 - 99 mg/dL    Comment: Glucose reference range applies only to samples taken after fasting for at least 8 hours.   BUN 57 (H) 8 - 23 mg/dL   Creatinine, Ser 12.99 (H) 0.44 - 1.00 mg/dL   Calcium 6.3 (LL) 8.9 - 10.3 mg/dL    Comment: CRITICAL RESULT CALLED TO, READ BACK BY AND VERIFIED WITH: CHRISCO C,RN 07/19/20 2358 WAYK    GFR, Estimated 3 (L) >60 mL/min    Comment: (NOTE) Calculated using the CKD-EPI Creatinine Equation (2021)    Anion gap 21 (H) 5 - 15    Comment: Performed at [facility] [address] [place], [place] [address]  CBC     Status: None   Collection Time: 07/19/20 10:54 PM  Result Value Ref Range   WBC 5.2 4.0 - 10.5 K/uL   RBC 4.29 3.87 - 5.11 MIL/uL   Hemoglobin 13.0 12.0 - 15.0 g/dL   HCT 41.9 36.0 - 46.0 %   MCV 97.7 80.0 - 100.0 fL   MCH 30.3 26.0 - 34.0 pg   MCHC 31.0 30.0 - 36.0 g/dL   RDW 14.7 11.5 - 15.5 %   Platelets 166 150 - 400 K/uL   nRBC 0.0 0.0 - 0.2 %    Comment: Performed at [facility], [place] [address] [place], [place] [address]  Resp Panel by RT-PCR (Flu A&B, Covid) Nasopharyngeal Swab     Status: Abnormal   Collection Time: 07/19/20 11:02 PM   Specimen: Nasopharyngeal Swab; Nasopharyngeal(NP) swabs in vial transport medium  Result Value Ref Range   SARS Coronavirus 2 by RT PCR POSITIVE (A) NEGATIVE     Comment: RESULT CALLED TO, READ BACK BY AND VERIFIED WITH: C CRISCO RN 07/20/20 0047 (NOTE) SARS-CoV-2 target nucleic acids are DETECTED.  The SARS-CoV-2 RNA is generally detectable in upper respiratory specimens during the acute phase of infection. Positive results are indicative of the presence of the identified virus, but do not rule out bacterial infection or co-infection with other pathogens not detected by the test. Clinical correlation with patient history and other diagnostic information is necessary to determine patient infection status. The expected result is Negative.  Fact Sheet for Patients: EntrepreneurPulse.com.au  Fact Sheet for Healthcare Providers: IncredibleEmployment.be  This test is not yet approved or cleared by the [place] FDA and  has been authorized for detection and/or diagnosis of SARS-CoV-2 by FDA under an Emergency Use Authorization (EUA).  This EUA will remain in effect (meaning this test can be used) f or the duration of  the COVID-19 declaration under Section 564(b)(1) of the Act, 21 U.S.C. section 360bbb-3(b)(1), unless the authorization is terminated or revoked sooner.     Influenza A by PCR NEGATIVE NEGATIVE   Influenza B by PCR NEGATIVE NEGATIVE    Comment: (NOTE) The Xpert Xpress SARS-CoV-2/FLU/RSV plus assay is intended as an aid in the diagnosis of influenza from Nasopharyngeal  swab specimens and should not be used as a sole basis for treatment. Nasal washings and aspirates are unacceptable for Xpert Xpress SARS-CoV-2/FLU/RSV testing.  Fact Sheet for Patients: EntrepreneurPulse.com.au  Fact Sheet for Healthcare Providers: IncredibleEmployment.be  This test is not yet approved or cleared by the [place] FDA and has been authorized for detection and/or diagnosis of SARS-CoV-2 by FDA under an Emergency Use Authorization (EUA). This EUA will remain in  effect (meaning this test can be used) for the duration of the COVID-19 declaration under Section 564(b)(1) of the Act, 21 U.S.C. section 360bbb-3(b)(1), unless the authorization is terminated or revoked.  Performed at [facility], [place] [address], [place], [place] [address]    DG Chest Portable 1 View  Result Date: 07/19/2020 CLINICAL DATA:  Cough for 2 weeks.  Possible COVID. EXAM: PORTABLE CHEST 1 VIEW COMPARISON:  Radiograph 04/24/2020, CT 07/17/2006 FINDINGS: Left-sided dialysis catheter and right chest port remain in place. Lower lung volumes from prior exam. Stable heart size and mediastinal contours. Right pericardiac density corresponds to hiatal hernia on prior CT. Minimal patchy opacity at both lung bases. No significant pleural effusion. No pneumothorax. Vascular stent in the left upper arm. IMPRESSION: Minimal patchy opacity at both lung bases, may represent atelectasis or pneumonia. Electronically Signed   By: Keith Rake M.D.   On: 07/19/2020 23:28    PMH:   Past Medical History:  Diagnosis Date  . Blood transfusion without reported diagnosis   . CAD (coronary artery disease)    Nonobstructive at cardiac catheterization 2000  . Cataract   . Cervical cancer ([place]) 1978  . CHF (congestive heart failure) ([place])   . Chronic back pain   . Chronic kidney disease    MWF dialysis  . Class 2 obesity with body mass index (BMI) of 35 to 39.9 without comorbidity   . COPD (chronic obstructive pulmonary disease) ([place])   . Degenerative disc disease   . DM (diabetes mellitus), type 2 with renal complications ([place])   . Dyslipidemia   . Dysrhythmia    a-fib  . ESRD (end stage renal disease) on dialysis ([place])   . Essential hypertension   . Essential hypertension, benign   . GERD (gastroesophageal reflux disease)   . Gout   . Hiatal hernia 07/27/2013  . History of diverticulitis of colon   . History of hiatal hernia   . Iron deficiency anemia   . Irritable bowel  syndrome   . Lumbar radiculopathy   . Mixed hyperlipidemia   . Moderate major depression, single episode ([place]) 07/21/2019  . Neuropathy   . OSA (obstructive sleep apnea)   . Osteoporosis   . Ovarian cancer [facility]) 1978   patient denies. States this was cervical cancer  . Oxygen deficiency    room air  . PAF (paroxysmal atrial fibrillation) ([place])   . Sleep apnea   . Type 2 diabetes mellitus ([place])   . Vitamin B deficiency 12/25/2009  . Vitamin B12 deficiency     PSH:   Past Surgical History:  Procedure Laterality Date  . ABDOMINAL HYSTERECTOMY    . AV FISTULA PLACEMENT Left 08/05/2019   Procedure: ARTERIOVENOUS (AV) FISTULA CREATION LEFT ARM;  Surgeon: Waynetta Sandy, MD;  Location: [facility];  Service: Vascular;  Laterality: Left;  . BACK SURGERY    . BASCILIC VEIN TRANSPOSITION Left 10/05/2019   Procedure: SECOND STAGE LEFT BASCILIC VEIN TRANSPOSITION;  Surgeon: Waynetta Sandy, MD;  Location: [facility];  Service: Vascular;  Laterality: Left;  . Benign breast cysts    . CHOLECYSTECTOMY    . COLONOSCOPY  10/01/2006   SLF:Pan colonic diverticulosis and moderate internal hemorrhoids/ Otherwise no polyps, masses, inflammatory changes or AVMs/  . COLONOSCOPY  2011   SLF: pancolonic diverticulosis, large internal hemorrhoids  . COLONOSCOPY N/A 01/26/2016   Procedure: COLONOSCOPY;  Surgeon: [name], MD;  [facility];  Service: Endoscopy;  Laterality: N/A;  830   . COLONOSCOPY WITH PROPOFOL N/A 02/10/2020   Procedure: COLONOSCOPY WITH PROPOFOL;  Surgeon: Daneil Dolin, MD;  [facility];  Service: Endoscopy;  Laterality: N/A;  10:45am  . ESOPHAGOGASTRODUODENOSCOPY  11/19/2006   SLF: Large hiatal hernia without evidence of Cameron ulcers/. Distal esophageal stricture, which allowed the gastroscope to pass without resistance.  A 16 mm Savary later passed with mild resistance/ Normal stomach.sb bx negative  . ESOPHAGOGASTRODUODENOSCOPY  10/01/2006    BJS:EGBTD hiatal hernia.  Distal esophagus without evidence of   erythema, ulceration or Barrett's esophagus  . ESOPHAGOGASTRODUODENOSCOPY  2011   SLF: large hh, distal esophageal web narrowing to 4mm s/p dilation to 12mm  . ESOPHAGOGASTRODUODENOSCOPY N/A 08/06/2013   SLF: 1. Stricture at the gastroesophageal junction 2. large hiatal hernia. 3. Mild erosive gastritis.  Marland Kitchen ESOPHAGOGASTRODUODENOSCOPY (EGD) WITH PROPOFOL N/A 07/19/2019   rourk: Mild erosive reflux esophagitis.  Mild Schatzki ring status post dilation.  Large hiatal hernia with at least one half of the stomach above the diaphragm.  Gastric mucosa erythematous.  Marland Kitchen GIVENS CAPSULE STUDY N/A 08/06/2013   INCOMPLETE-SMALL BOWLE ULCERS  . IR FLUORO GUIDE CV LINE LEFT  07/29/2019  . IR US GUIDE VASC ACCESS LEFT  07/29/2019  . KNEE SURGERY Right   . PARTIAL HYSTERECTOMY  1978  . small bowel capsule  2008   negative  . SPINE SURGERY    . TONSILLECTOMY AND ADENOIDECTOMY    . Two back surgeries/fusion    . UMBILICAL HERNIA REPAIR  2010    Allergies: No Known Allergies  Medications:   Prior to Admission medications   Medication Sig Start Date End Date Taking? Authorizing Provider  acetaminophen (TYLENOL) 325 MG tablet Take 650 mg by mouth daily as needed for headache (pain).    [provider]  acetaminophen (TYLENOL) 500 MG tablet Take 500-1,000 mg by mouth every 6 (six) hours as needed (for pain.).    [provider]  albuterol (PROVENTIL) (2.5 MG/3ML) 0.083% nebulizer solution Inhale 3 mLs into the lungs daily as needed for wheezing or shortness of breath. 12/22/19   [provider]  albuterol (PROVENTIL) (2.5 MG/3ML) 0.083% nebulizer solution Take 2.5 mg by nebulization 4 (four) times daily as needed for wheezing or shortness of breath.  12/22/19   [provider]  albuterol (VENTOLIN HFA) 108 (90 Base) MCG/ACT inhaler Inhale 2 puffs into the lungs every 6 (six) hours as needed for wheezing or  shortness of breath.  04/09/19   [provider]  albuterol (VENTOLIN HFA) 108 (90 Base) MCG/ACT inhaler Inhale 2 puffs into the lungs every 6 (six) hours as needed for wheezing or shortness of breath.  11/16/19   [provider]  allopurinol (ZYLOPRIM) 100 MG tablet Take 1 tablet (100 mg total) by mouth 2 (two) times daily. 08/09/19   Nita Sells, MD  allopurinol (ZYLOPRIM) 100 MG tablet Take 100 mg by mouth 2 (two) times daily. 03/02/20   [provider]  amiodarone (PACERONE) 200 MG tablet Take 1 tablet (200 mg total)  by mouth daily. 08/10/19   Nita Sells, MD  amiodarone (PACERONE) 200 MG tablet Take 200 mg by mouth daily. 03/02/20   [provider]  apixaban (ELIQUIS) 2.5 MG TABS tablet Take 2.5 mg by mouth 2 (two) times daily.    [provider]  apixaban (ELIQUIS) 2.5 MG TABS tablet Take 2.5 mg by mouth 2 (two) times daily.    [provider]  aspirin EC 81 MG tablet Take 81 mg by mouth daily.    [provider]  aspirin EC 81 MG tablet Take 81 mg by mouth daily. Swallow whole.    [provider]  atorvastatin (LIPITOR) 40 MG tablet Take 1 tablet (40 mg total) by mouth at bedtime. 05/04/18   Satira Sark, MD  atorvastatin (LIPITOR) 40 MG tablet Take 40 mg by mouth at bedtime. 03/02/20   [provider]  Biotin 5000 MCG TABS Take 5,000 mcg by mouth daily.    [provider]  Biotin 5000 MCG TABS Take 5,000 mcg by mouth daily.    [provider]  Carboxymethylcellul-Glycerin (CLEAR EYES FOR DRY EYES) 1-0.25 % SOLN Place 1 drop into both eyes daily as needed (dry eys).    [provider]  Carboxymethylcellul-Glycerin (CLEAR EYES FOR DRY EYES) 1-0.25 % SOLN Place 1 drop into both eyes daily as needed (dry eyes).    [provider]  diclofenac Sodium (VOLTAREN) 1 % GEL Apply 2 g topically 4 (four) times daily. 04/27/20   Geradine Girt, DO  esomeprazole (NEXIUM) 40 MG  capsule Take 40 mg by mouth daily. 12/06/19   [provider]  esomeprazole (NEXIUM) 40 MG capsule Take 40 mg by mouth daily. 03/08/20   [provider]  famotidine (PEPCID) 40 MG tablet TAKE 1 TABLET AT BEDTIME AS NEEDED TO CONTROL REFLUX Patient taking differently: Take 40 mg by mouth at bedtime as needed for heartburn.  06/23/15   Mannam, Hart Robinsons, MD  famotidine (PEPCID) 40 MG tablet Take 40 mg by mouth at bedtime. 03/02/20   [provider]  ferric citrate (AURYXIA) 1 GM 210 MG(Fe) tablet Take 210 mg by mouth 2 (two) times daily with a meal.    [provider]  Fluticasone-Umeclidin-Vilant (TRELEGY ELLIPTA) 100-62.5-25 MCG/INH AEPB Inhale 1 puff into the lungs daily as needed (shortness of breath/wheezing).    [provider]  gabapentin (NEURONTIN) 100 MG capsule Take 100 mg by mouth at bedtime. 12/29/19   [provider]  gabapentin (NEURONTIN) 100 MG capsule Take 100 mg by mouth at bedtime. 04/18/20   [provider]  insulin lispro (HUMALOG) 100 UNIT/ML injection Inject 5-16 Units into the skin 3 (three) times daily as needed (greater than 120).     [provider]  LANTUS SOLOSTAR 100 UNIT/ML SOPN Inject 10 Units into the skin daily as needed (over 120 blood sugars).  10/06/12   [provider]  lidocaine (LIDODERM) 5 % Place 1 patch onto the skin daily. Remove & Discard patch within 12 hours or as directed by MD Patient taking differently: Place 1 patch onto the skin daily as needed (pain.). Remove & Discard patch within 12 hours or as directed by MD 11/21/19   Garald Balding, PA-C  lidocaine (LIDODERM) 5 % Place 1 patch onto the skin daily. 11/21/19   [provider]  midodrine (PROAMATINE) 10 MG tablet Take 1 tablet (10 mg total) by mouth 3 (three) times daily with meals. 04/28/20   Geradine Girt, DO  nitroGLYCERIN (NITROSTAT) 0.4 MG SL tablet Place 0.4 mg under the tongue every 5 (five) minutes x 3 doses as  needed for chest pain. 12/07/19   [provider]  nitroGLYCERIN (NITROSTAT) 0.4 MG SL tablet Place 0.4 mg under the tongue every 5 (five) minutes as needed for chest pain.  12/07/19   [provider]  ondansetron (ZOFRAN) 4 MG tablet Take 4 mg by mouth daily as needed for nausea/vomiting. 12/29/19   [provider]  ondansetron (ZOFRAN) 4 MG tablet Take 4 mg by mouth every 8 (eight) hours as needed for nausea or vomiting.  04/04/20   [provider]  OXYGEN Inhale 2 L into the lungs as needed (shortness of breath).    [provider]  PRESCRIPTION MEDICATION Inhale into the lungs at bedtime. CPAP    [provider]  tamsulosin (FLOMAX) 0.4 MG CAPS capsule Take 1 capsule (0.4 mg total) by mouth daily. 07/08/19   Johnson, Clanford L, MD  tamsulosin (FLOMAX) 0.4 MG CAPS capsule Take 0.4 mg by mouth daily. 03/02/20   [provider]  traMADol (ULTRAM) 50 MG tablet Take 1 tablet (50 mg total) by mouth every 8 (eight) hours as needed (pain). 04/27/20   Eulogio Bear U, DO  TRELEGY ELLIPTA 100-62.5-25 MCG/INH AEPB Inhale 1 puff into the lungs daily as needed (respiratory issues.).     [provider]    Discontinued Meds:  There are no discontinued medications.  Social History:  reports that she quit smoking about 58 years ago. Her smoking use included cigarettes. She started smoking about 59 years ago. She has a 1.00 pack-year smoking history. She has never used smokeless tobacco. She reports that she does not drink alcohol and does not use drugs.  Family History:   Family History  Problem Relation Age of Onset  . Colon cancer Brother        diagnosed age 75. Living.   Marland Kitchen Ulcers Sister   . Diabetes Sister   . Heart attack Sister   . Kidney failure Sister   . Stroke Sister   . Ulcers Mother   . Diabetes Mother   . Heart attack Mother   . Stroke Mother   . Asthma Mother   . Heart disease Mother   . Cervical cancer Mother   .  Heart attack Brother   . Heart disease Brother   . Asthma Sister   . Diabetes Brother   . Stroke Maternal Grandmother   . Heart attack Maternal Grandmother   . Heart attack Other   . Early death Father        MVA in his 94s    Blood pressure (!) 149/55, pulse 87, temperature 98.5 F (36.9 C), resp. rate 18, height 4\' 8"  (1.422 m), weight 76.7 kg, SpO2 99 %. General appearance: alert, cooperative and appears stated age Head: Normocephalic, without obvious abnormality, atraumatic Eyes: negative Neck: no adenopathy, no carotid bruit, supple, symmetrical, trachea midline and thyroid not enlarged, symmetric, no tenderness/mass/nodules Back: symmetric, no curvature. ROM normal. No CVA tenderness. Resp: rhonchi bibasilar and bilaterally Chest wall: no tenderness Cardio: irregularly irregular rhythm GI: abnormal findings:  obese Extremities: edema 1+ Pulses: 2+ and symmetric Access: LIJ TC and lt BBT (+thrill)       Dwana Melena, MD 07/20/2020, 2:15 AM

## 2020-07-20 NOTE — ED Provider Notes (Signed)
Glucose in the 60s Given oral juice and increased dextrose infusion Pt othewise stable at this time   Ripley Fraise, MD 07/20/20 [phone]

## 2020-07-20 NOTE — Progress Notes (Signed)
Spoke with Dialysis at 430pm and they stated they cant take patient at this time. Charge nurse aware. Noted patient to be having dysrhythmias due to electrolyte imbalaces, MDs and Charge nurse aware. Patient given calcium gluconate, IV insulin, and D50amp.

## 2020-07-21 ENCOUNTER — Other Ambulatory Visit: Payer: Self-pay

## 2020-07-21 DIAGNOSIS — J410 Simple chronic bronchitis: Secondary | ICD-10-CM

## 2020-07-21 LAB — CBC WITH DIFFERENTIAL/PLATELET
Abs Immature Granulocytes: 0.03 10*3/uL (ref 0.00–0.07)
Basophils Absolute: 0 10*3/uL (ref 0.0–0.1)
Basophils Relative: 0 %
Eosinophils Absolute: 0 10*3/uL (ref 0.0–0.5)
Eosinophils Relative: 0 %
HCT: 37.5 % (ref 36.0–46.0)
Hemoglobin: 11.9 g/dL — ABNORMAL LOW (ref 12.0–15.0)
Immature Granulocytes: 1 %
Lymphocytes Relative: 18 %
Lymphs Abs: 0.8 10*3/uL (ref 0.7–4.0)
MCH: 29.2 pg (ref 26.0–34.0)
MCHC: 31.7 g/dL (ref 30.0–36.0)
MCV: 91.9 fL (ref 80.0–100.0)
Monocytes Absolute: 0.5 10*3/uL (ref 0.1–1.0)
Monocytes Relative: 10 %
Neutro Abs: 3.4 10*3/uL (ref 1.7–7.7)
Neutrophils Relative %: 71 %
Platelets: 145 10*3/uL — ABNORMAL LOW (ref 150–400)
RBC: 4.08 MIL/uL (ref 3.87–5.11)
RDW: 14.3 % (ref 11.5–15.5)
WBC: 4.7 10*3/uL (ref 4.0–10.5)
nRBC: 0 % (ref 0.0–0.2)

## 2020-07-21 LAB — COMPREHENSIVE METABOLIC PANEL
ALT: 10 U/L (ref 0–44)
AST: 16 U/L (ref 15–41)
Albumin: 3.1 g/dL — ABNORMAL LOW (ref 3.5–5.0)
Alkaline Phosphatase: 54 U/L (ref 38–126)
Anion gap: 16 — ABNORMAL HIGH (ref 5–15)
BUN: 20 mg/dL (ref 8–23)
CO2: 26 mmol/L (ref 22–32)
Calcium: 7.4 mg/dL — ABNORMAL LOW (ref 8.9–10.3)
Chloride: 96 mmol/L — ABNORMAL LOW (ref 98–111)
Creatinine, Ser: 7.15 mg/dL — ABNORMAL HIGH (ref 0.44–1.00)
GFR, Estimated: 6 mL/min — ABNORMAL LOW (ref >60)
Glucose, Bld: 83 mg/dL (ref 70–99)
Potassium: 4.4 mmol/L (ref 3.5–5.1)
Sodium: 138 mmol/L (ref 135–145)
Total Bilirubin: 0.8 mg/dL (ref 0.3–1.2)
Total Protein: 6.2 g/dL — ABNORMAL LOW (ref 6.5–8.1)

## 2020-07-21 LAB — GLUCOSE, CAPILLARY
Glucose-Capillary: 76 mg/dL (ref 70–99)
Glucose-Capillary: 112 mg/dL — ABNORMAL HIGH (ref 70–99)
Glucose-Capillary: 159 mg/dL — ABNORMAL HIGH (ref 70–99)
Glucose-Capillary: 235 mg/dL — ABNORMAL HIGH (ref 70–99)

## 2020-07-21 LAB — D-DIMER, QUANTITATIVE: D-Dimer, Quant: 0.59 ug/mL-FEU — ABNORMAL HIGH (ref 0.00–0.50)

## 2020-07-21 LAB — C-REACTIVE PROTEIN: CRP: 3.6 mg/dL — ABNORMAL HIGH (ref <1.0)

## 2020-07-21 MED ORDER — ONDANSETRON HCL 4 MG/2ML IJ SOLN
4.0000 mg | Freq: Four times a day (QID) | INTRAMUSCULAR | Status: DC | PRN
  Administered 2020-07-21: 4 mg via INTRAVENOUS
  Filled 2020-07-21: qty 2

## 2020-07-21 MED ORDER — HEPARIN SODIUM (PORCINE) 1000 UNIT/ML IJ SOLN
INTRAMUSCULAR | Status: AC
  Administered 2020-07-21: 1000 [IU]
  Filled 2020-07-21: qty 4

## 2020-07-21 NOTE — Progress Notes (Signed)
[facility] Progress Note  Subjective:  Patient not examined today directly given COVID-19 + status, utilizing data taken from chart +/- discussions w/ providers and staff.   K+ down 4.8 today, had HD overnight last night.   Vitals:   07/21/20 0413 07/21/20 0752 07/21/20 0800 07/21/20 1211  BP: (!) 108/50 (!) 103/39 (!) 112/36 (!) 114/45  Pulse: 61 60 61 65  Resp: 18 18 19 18   Temp:    98.7 F (37.1 C)  TempSrc:  Oral  Oral  SpO2: 97% 96% 90% 94%  Weight: 75.9 kg     Height:        Exam:  Patient not examined today directly given COVID-19 + status, utilizing data taken from chart +/- discussions w/ providers and staff.     CXR - IMPRESSION:  Minimal patchy opacity at both lung bases, may represent atelectasis or pneumonia.   OP HD: MWF [facility] (COVID) after DC   4h  77kg  P4  Hep none  2/2.5 bath  TDC/ LUA AVF   Calc 1.75   mircera 75 q 2 venofer 100   Midodrine ?   Assessment/ Plan: 1. COVID -treatment per primary. 2. ESRD: TTS HD.  Had HD overnight last night and K+ down, 3 L off. O2 is down today. Wt's are better, 1kg under by wts. Plan HD tomorrow off schedule, then resume MWF.  3. HD access: has TDC and AVF. AVF has been used but she has been infiltrated multiple times and the plan was AVG by [facility].  3. Hypotension - will need  Midodrine before HD (10mg  TID) 4. Anemia of ESRD:hgb over 10- No ESA needed at present.  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 COPD/ OSA 7. Afib on eliquis 8. Gout 9. DM2  Rob Waymond Meador 07/21/2020, 3:21 PM   Recent Labs  Lab 07/20/20 1530 07/21/20 0850  K >7.5* 4.4  BUN 56* 20  CREATININE 13.51* 7.15*  CALCIUM 5.9* 7.4*  PHOS 7.8*  --   HGB 11.7* 11.9*   Inpatient medications: . allopurinol  100 mg Oral BID  . amiodarone  200 mg Oral Daily  . apixaban  2.5 mg Oral BID  . vitamin C  500 mg Oral Daily  . aspirin EC  81 mg Oral Daily  . atorvastatin  40 mg Oral QHS  . Chlorhexidine Gluconate  Cloth  6 each Topical Q0600  . ferric citrate  210 mg Oral BID WC  . fluticasone furoate-vilanterol  1 puff Inhalation Daily  . gabapentin  100 mg Oral QHS  . insulin aspart  0-6 Units Subcutaneous TID WC  . methylPREDNISolone (SOLU-MEDROL) injection  60 mg Intravenous Q12H  . midodrine  10 mg Oral TID WC  . pantoprazole  40 mg Oral Daily  . umeclidinium bromide  1 puff Inhalation Daily  . zinc sulfate  220 mg Oral Daily   . remdesivir 100 mg in NS 100 mL 100 mg (07/21/20 1120)   albuterol, chlorpheniramine-HYDROcodone, guaiFENesin-dextromethorphan, nitroGLYCERIN, ondansetron (ZOFRAN) IV, sodium chloride flush, traMADol

## 2020-07-21 NOTE — Progress Notes (Signed)
Pt now back from dialysis, report received from dialysis RN. Pt complaining of nausea, MD paged, will give ordered PRN, no other needs at this time, will CTM.

## 2020-07-21 NOTE — Progress Notes (Signed)
°   07/21/20 0300  Hand-Off documentation  Report given to (Full Name) Donne Anon, RN  Report received from (Full Name) Rawley Harju, RN  Vitals  Temp 99 F (37.2 C)  Temp Source Oral  BP (!) 129/48  BP Location Right Arm  BP Method Automatic  Patient Position (if appropriate) Lying  Pulse Rate 69  Pulse Rate Source Monitor  ECG Heart Rate 69  Resp (!) 22  Oxygen Therapy  SpO2 97 %  O2 Device Nasal Cannula  O2 Flow Rate (L/min) 4 L/min  Pain Assessment  Pain Scale 0-10  Pain Score 0  Pain Type Acute pain  Pain Location Back  Pain Orientation Lower  Post-Hemodialysis Assessment  Rinseback Volume (mL) 250 mL  KECN 239 V  Dialyzer Clearance Lightly streaked  Duration of HD Treatment -hour(s) 3.5 hour(s)  Hemodialysis Intake (mL) 600 mL  UF Total -Machine (mL) 3782 mL  Net UF (mL) 3182 mL  Tolerated HD Treatment Yes  Post-Hemodialysis Comments tx achieved as expected, pt tolerated the HD tx very well. no SOB, on O2  98%, 4 L/min [place], c/o back soreness at the end of the tx.  AVG/AVF Arterial Site Held (minutes) 0 minutes  AVG/AVF Venous Site Held (minutes) 0 minutes  Education / Care Plan  Dialysis Education Provided Yes  Hemodialysis Catheter Left Subclavian  No placement date or time found.   Orientation: Left  Access Location: Subclavian  Site Condition No complications  Blue Lumen Status Heparin locked;Capped (Central line)  Red Lumen Status Heparin locked;Capped (Central line)  Catheter fill solution Heparin 1000 units/ml  Catheter fill volume (Arterial) 1.6 cc  Catheter fill volume (Venous) 1.6  Dressing Type Occlusive  Dressing Status Clean;Dry;Intact  Interventions New dressing  Drainage Description None  Dressing Change Due 07/28/20

## 2020-07-21 NOTE — Progress Notes (Signed)
Patient's outpatient HD clinic is [place]. She will need to dialyze in isolation for 21 days from positive COVID test on a 3rd shift MWF at [facility] at discharge. Navigator has notified clinic of positive test 07/20/20.  Alphonzo Cruise, [place] [facility] Navigator [phone]

## 2020-07-21 NOTE — Progress Notes (Signed)
[facility]                                                                              Patient Demographics  [name], is a 74 y.o. female, DOB - 10-27-1945, ZOX:[id number]  Admit date - 07/19/2020   Admitting Physician [name], MD  Outpatient Primary MD for the patient is [name], MD  Outpatient specialists:   LOS - 1  days   Medical records reviewed and are as summarized below:    Chief Complaint  Patient presents with  . Cough       Brief summary   Patient is a 74 year old female with history of ESRD on HD, MWF, DM type II, paroxysmal A. fib, gout had been experiencing worsening shortness of breath with productive cough generalized weakness and vomiting and diarrhea for the last week.  Patient has missed her dialysis almost for last 2 sessions because patient states there was some change in the schedule and when she went last Monday about 3 days ago she had a long wait time and had to go back home.  Patient reported that her family members at home has been diagnosed with Covid infection.  Patient has been having productive cough, and diarrhea.  Denies chest pain. In ED, was hypoxic requiring 4 L O2.  Chest x-ray showed bilateral infiltrates.  Covid test positive. Labs significant for hyperkalemia, EKG with peaked T waves, received Lokelma and nephrology was consulted. In ED, patient was hypoglycemic, was placed on D10.  Admitted for acute respiratory failure with hypoxia due to Covid infection, fluid overload due to missed HD.     Assessment & Plan    Acute hypoxic respiratory failure due to acute COVID-19 viral pneumonia during the ongoing COVID-19 pandemic- POA - Patient presented with productive cough, generalized weakness, nausea vomiting diarrhea, shortness of breath for a week.  Chest x-ray showed bilateral infiltrates.  Likely has combination of fluid overload due to missed hemodialysis x2 and COVID-19 -Improving after  dialysis, continue IV steroids, remdesivir per pharmacy protocol  - Continue Supportive care: vitamin C/zinc, albuterol, Tylenol. 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  Paroxysmal atrial fibrillation -Rate currently controlled, continue amiodarone - continue Eliquis  GERD -Continue PPI    Chronic obstructive pulmonary disease (HCC) -Continue IV steroids, Incruse Ellipta, Ventolin inhaler -No acute wheezing  Pressure injury, POA -Sacrum with stage II, wound care per nursing  Obesity Estimated body mass index is 37.51 kg/m as calculated from the following:   Height as of this encounter: 4\' 8"  (1.422 m).   Weight as of this encounter: 75.9 kg.  - will need lifestyle changes and diet control  Code Status: Full CODE STATUS DVT Prophylaxis: Eliquis Family Communication: Discussed all  imaging results, lab results, explained to the patient. Called patient's son, [name] [phone], unable to make contact, left a detailed message  Disposition Plan:     Status is: Inpatient  Remains inpatient appropriate because:Inpatient level of care appropriate due to severity of illness   Dispo: The patient is from: Home              Anticipated d/c is to: Home              Anticipated d/c date is: > 3 days              Patient currently is not medically stable to d/c.  Hypoxia, active Covid infection,     Time Spent in minutes 34mins   Procedures:  Hemodialysis  Consultants:   *Nephrology  Antimicrobials:   Anti-infectives (From admission, onward)   Start     Dose/Rate Route Frequency Ordered Stop   07/21/20 1000  remdesivir 100 mg in sodium chloride 0.9 % 100 mL IVPB       "Followed by" Linked Group Details   100 mg 200 mL/hr over 30 Minutes Intravenous Daily 07/20/20 0142 07/25/20 0959   07/21/20 1000  remdesivir 100 mg in sodium chloride 0.9 % 100 mL IVPB  Status:  Discontinued       "Followed by" Linked Group Details   100 mg 200 mL/hr over 30 Minutes Intravenous Daily 07/20/20 0546 07/20/20 0551   07/20/20 0600  remdesivir 200 mg in sodium chloride 0.9% 250 mL IVPB  Status:  Discontinued       "Followed by" Linked Group Details   200 mg 580 mL/hr over 30 Minutes Intravenous Once 07/20/20 0546 07/20/20 0551   07/20/20 0230  remdesivir 200 mg in sodium chloride 0.9% 250 mL IVPB       "Followed by" Linked Group Details   200 mg 580 mL/hr over 30 Minutes Intravenous Once 07/20/20 0142 07/20/20 0402         Medications  Scheduled Meds: . allopurinol  100 mg Oral BID  . amiodarone  200 mg Oral Daily  . apixaban  2.5 mg Oral BID  . vitamin C  500 mg Oral Daily  . aspirin EC  81 mg Oral Daily  . atorvastatin  40 mg Oral QHS  . Chlorhexidine Gluconate Cloth  6 each Topical Q0600  . ferric citrate  210 mg Oral BID WC  . fluticasone furoate-vilanterol   1 puff Inhalation Daily  . gabapentin  100 mg Oral QHS  . insulin aspart  0-6 Units Subcutaneous TID WC  . methylPREDNISolone (SOLU-MEDROL) injection  60 mg Intravenous Q12H  . midodrine  10 mg Oral TID WC  . pantoprazole  40 mg Oral Daily  .  umeclidinium bromide  1 puff Inhalation Daily  . zinc sulfate  220 mg Oral Daily   Continuous Infusions: . remdesivir 100 mg in NS 100 mL 100 mg (07/21/20 1120)   PRN Meds:.albuterol, chlorpheniramine-HYDROcodone, guaiFENesin-dextromethorphan, nitroGLYCERIN, ondansetron (ZOFRAN) IV, sodium chloride flush, traMADol      Subjective:   Mirtie Bastyr was seen and examined today.  Much more alert and oriented today, hypoxia improving.  Status post hemodialysis yesterday.  Still has coughing and shortness of breath.  No chest pain, abdominal pain, nausea vomiting or diarrhea.  No fevers.   Objective:   Vitals:   07/21/20 0413 07/21/20 0752 07/21/20 0800 07/21/20 1211  BP: (!) 108/50 (!) 103/39 (!) 112/36 (!) 114/45  Pulse: 61 60 61 65  Resp: 18 18 19 18   Temp:    98.7 F (37.1 C)  TempSrc:  Oral  Oral  SpO2: 97% 96% 90% 94%  Weight: 75.9 kg     Height:        Intake/Output Summary (Last 24 hours) at 07/21/2020 1519 Last data filed at 07/21/2020 1431 Gross per 24 hour  Intake 120 ml  Output 3182 ml  Net -3062 ml     Wt Readings from Last 3 Encounters:  07/21/20 75.9 kg  04/28/20 78.3 kg  04/07/20 75.3 kg    Physical Exam  General: Alert and oriented x 3, NAD  Cardiovascular: S1 S2 clear, RRR. No pedal edema b/l  Respiratory: Decreased breath sound at the bases  Gastrointestinal: Soft, nontender, nondistended, NBS  Ext: no pedal edema bilaterally  Neuro: no new deficits  Musculoskeletal: No cyanosis, clubbing  Skin: No rashes  Psych: Normal affect and demeanor, alert and oriented x3     Data Reviewed:  I have personally reviewed following labs and imaging studies  Micro Results Recent Results (from the past  240 hour(s))  Resp Panel by RT-PCR (Flu A&B, Covid) Nasopharyngeal Swab     Status: Abnormal   Collection Time: 07/19/20 11:02 PM   Specimen: Nasopharyngeal Swab; Nasopharyngeal(NP) swabs in vial transport medium  Result Value Ref Range Status   SARS Coronavirus 2 by RT PCR POSITIVE (A) NEGATIVE Final    Comment: RESULT CALLED TO, READ BACK BY AND VERIFIED WITH: C CRISCO RN 07/20/20 0047 (NOTE) SARS-CoV-2 target nucleic acids are DETECTED.  The SARS-CoV-2 RNA is generally detectable in upper respiratory specimens during the acute phase of infection. Positive results are indicative of the presence of the identified virus, but do not rule out bacterial infection or co-infection with other pathogens not detected by the test. Clinical correlation with patient history and other diagnostic information is necessary to determine patient infection status. The expected result is Negative.  Fact Sheet for Patients: EntrepreneurPulse.com.au  Fact Sheet for Healthcare Providers: IncredibleEmployment.be  This test is not yet approved or cleared by the [place] FDA and  has been authorized for detection and/or diagnosis of SARS-CoV-2 by FDA under an Emergency Use Authorization (EUA).  This EUA will remain in effect (meaning this test can be used) f or the duration of  the COVID-19 declaration under Section 564(b)(1) of the Act, 21 U.S.C. section 360bbb-3(b)(1), unless the authorization is terminated or revoked sooner.     Influenza A by PCR NEGATIVE NEGATIVE Final   Influenza B by PCR NEGATIVE NEGATIVE Final    Comment: (NOTE) The Xpert Xpress SARS-CoV-2/FLU/RSV plus assay is intended as an aid in the diagnosis of influenza from Nasopharyngeal swab specimens and should not be used as a sole basis  for treatment. Nasal washings and aspirates are unacceptable for Xpert Xpress SARS-CoV-2/FLU/RSV testing.  Fact Sheet for  Patients: EntrepreneurPulse.com.au  Fact Sheet for Healthcare Providers: IncredibleEmployment.be  This test is not yet approved or cleared by the [place] FDA and has been authorized for detection and/or diagnosis of SARS-CoV-2 by FDA under an Emergency Use Authorization (EUA). This EUA will remain in effect (meaning this test can be used) for the duration of the COVID-19 declaration under Section 564(b)(1) of the Act, 21 U.S.C. section 360bbb-3(b)(1), unless the authorization is terminated or revoked.  Performed at [facility], [place] [address], [place], [place] [address]     Radiology Reports DG Chest Portable 1 View  Result Date: 07/19/2020 CLINICAL DATA:  Cough for 2 weeks.  Possible COVID. EXAM: PORTABLE CHEST 1 VIEW COMPARISON:  Radiograph 04/24/2020, CT 07/17/2006 FINDINGS: Left-sided dialysis catheter and right chest port remain in place. Lower lung volumes from prior exam. Stable heart size and mediastinal contours. Right pericardiac density corresponds to hiatal hernia on prior CT. Minimal patchy opacity at both lung bases. No significant pleural effusion. No pneumothorax. Vascular stent in the left upper arm. IMPRESSION: Minimal patchy opacity at both lung bases, may represent atelectasis or pneumonia. Electronically Signed   By: Keith Rake M.D.   On: 07/19/2020 23:28    Lab Data:  CBC: Recent Labs  Lab 07/19/20 2254 07/20/20 1530 07/21/20 0850  WBC 5.2 6.2 4.7  NEUTROABS  --   --  3.4  HGB 13.0 11.7* 11.9*  HCT 41.9 36.6 37.5  MCV 97.7 93.4 91.9  PLT 166 148* 177*   Basic Metabolic Panel: Recent Labs  Lab 07/19/20 2254 07/20/20 1530 07/21/20 0850  NA 141 137 138  K 6.8* >7.5* 4.4  CL 102 96* 96*  CO2 18* 20* 26  GLUCOSE 76 202* 83  BUN 57* 56* 20  CREATININE 12.99* 13.51* 7.15*  CALCIUM 6.3* 5.9* 7.4*  PHOS  --  7.8*  --    GFR: Estimated Creatinine Clearance: 5.6 mL/min (A) (by C-G formula based  on SCr of 7.15 mg/dL (H)). Liver Function Tests: Recent Labs  Lab 07/20/20 1530 07/21/20 0850  AST 18 16  ALT 10 10  ALKPHOS 56 54  BILITOT 0.6 0.8  PROT 5.9* 6.2*  ALBUMIN 2.8* 3.1*   No results for input(s): LIPASE, AMYLASE in the last 168 hours. No results for input(s): AMMONIA in the last 168 hours. Coagulation Profile: No results for input(s): INR, PROTIME in the last 168 hours. Cardiac Enzymes: No results for input(s): CKTOTAL, CKMB, CKMBINDEX, TROPONINI in the last 168 hours. BNP (last 3 results) No results for input(s): PROBNP in the last 8760 hours. HbA1C: No results for input(s): HGBA1C in the last 72 hours. CBG: Recent Labs  Lab 07/20/20 1552 07/20/20 1703 07/20/20 2055 07/21/20 0755 07/21/20 1210  GLUCAP 209* 218* 158* 76 112*   Lipid Profile: No results for input(s): CHOL, HDL, LDLCALC, TRIG, CHOLHDL, LDLDIRECT in the last 72 hours. Thyroid Function Tests: No results for input(s): TSH, T4TOTAL, FREET4, T3FREE, THYROIDAB in the last 72 hours. Anemia Panel: No results for input(s): VITAMINB12, FOLATE, FERRITIN, TIBC, IRON, RETICCTPCT in the last 72 hours. Urine analysis:    Component Value Date/Time   COLORURINE YELLOW 04/24/2020 1528   APPEARANCEUR HAZY (A) 04/24/2020 1528   LABSPEC 1.013 04/24/2020 1528   PHURINE 7.0 04/24/2020 1528   GLUCOSEU NEGATIVE 04/24/2020 1528   HGBUR NEGATIVE 04/24/2020 [place] 04/24/2020 1528   KETONESUR  NEGATIVE 04/24/2020 1528   PROTEINUR 100 (A) 04/24/2020 1528   UROBILINOGEN 0.2 01/29/2007 1500   NITRITE NEGATIVE 04/24/2020 [place] 04/24/2020 1528     Letonia Stead M.D. [facility] 07/21/2020, 3:19 PM   Call night coverage person covering after 7pm

## 2020-07-22 LAB — HEPATITIS B SURFACE ANTIGEN: Hepatitis B Surface Ag: NONREACTIVE

## 2020-07-22 LAB — CBC WITH DIFFERENTIAL/PLATELET
Abs Immature Granulocytes: 0.04 10*3/uL (ref 0.00–0.07)
Basophils Absolute: 0 10*3/uL (ref 0.0–0.1)
Basophils Relative: 0 %
Eosinophils Absolute: 0 10*3/uL (ref 0.0–0.5)
Eosinophils Relative: 0 %
HCT: 37.1 % (ref 36.0–46.0)
Hemoglobin: 12.3 g/dL (ref 12.0–15.0)
Immature Granulocytes: 1 %
Lymphocytes Relative: 12 %
Lymphs Abs: 0.6 10*3/uL — ABNORMAL LOW (ref 0.7–4.0)
MCH: 30.6 pg (ref 26.0–34.0)
MCHC: 33.2 g/dL (ref 30.0–36.0)
MCV: 92.3 fL (ref 80.0–100.0)
Monocytes Absolute: 0.2 10*3/uL (ref 0.1–1.0)
Monocytes Relative: 4 %
Neutro Abs: 3.8 10*3/uL (ref 1.7–7.7)
Neutrophils Relative %: 83 %
Platelets: 171 10*3/uL (ref 150–400)
RBC: 4.02 MIL/uL (ref 3.87–5.11)
RDW: 14.4 % (ref 11.5–15.5)
WBC: 4.6 10*3/uL (ref 4.0–10.5)
nRBC: 0 % (ref 0.0–0.2)

## 2020-07-22 LAB — COMPREHENSIVE METABOLIC PANEL
ALT: 8 U/L (ref 0–44)
AST: 13 U/L — ABNORMAL LOW (ref 15–41)
Albumin: 2.9 g/dL — ABNORMAL LOW (ref 3.5–5.0)
Alkaline Phosphatase: 50 U/L (ref 38–126)
Anion gap: 13 (ref 5–15)
BUN: 34 mg/dL — ABNORMAL HIGH (ref 8–23)
CO2: 28 mmol/L (ref 22–32)
Calcium: 7.1 mg/dL — ABNORMAL LOW (ref 8.9–10.3)
Chloride: 97 mmol/L — ABNORMAL LOW (ref 98–111)
Creatinine, Ser: 8.5 mg/dL — ABNORMAL HIGH (ref 0.44–1.00)
GFR, Estimated: 5 mL/min — ABNORMAL LOW (ref >60)
Glucose, Bld: 167 mg/dL — ABNORMAL HIGH (ref 70–99)
Potassium: 5.8 mmol/L — ABNORMAL HIGH (ref 3.5–5.1)
Sodium: 138 mmol/L (ref 135–145)
Total Bilirubin: 0.6 mg/dL (ref 0.3–1.2)
Total Protein: 5.9 g/dL — ABNORMAL LOW (ref 6.5–8.1)

## 2020-07-22 LAB — C-REACTIVE PROTEIN: CRP: 2.8 mg/dL — ABNORMAL HIGH (ref <1.0)

## 2020-07-22 LAB — D-DIMER, QUANTITATIVE: D-Dimer, Quant: 0.46 ug/mL-FEU (ref 0.00–0.50)

## 2020-07-22 LAB — GLUCOSE, CAPILLARY
Glucose-Capillary: 115 mg/dL — ABNORMAL HIGH (ref 70–99)
Glucose-Capillary: 120 mg/dL — ABNORMAL HIGH (ref 70–99)
Glucose-Capillary: 152 mg/dL — ABNORMAL HIGH (ref 70–99)
Glucose-Capillary: 223 mg/dL — ABNORMAL HIGH (ref 70–99)

## 2020-07-22 MED ORDER — HEPARIN SODIUM (PORCINE) 1000 UNIT/ML IJ SOLN: 1000.0000 [IU] | Freq: Once | INTRAMUSCULAR | Status: AC

## 2020-07-22 MED ORDER — HEPARIN SODIUM (PORCINE) 1000 UNIT/ML IJ SOLN
INTRAMUSCULAR | Status: AC
  Administered 2020-07-22: 1000 [IU]
  Filled 2020-07-22: qty 1

## 2020-07-22 NOTE — Progress Notes (Signed)
[facility]                                                                              Patient Demographics  [name], is a 74 y.o. female, DOB - October 06, 1945, ZOX:[id number]  Admit date - 07/19/2020   Admitting Physician [name], MD  Outpatient Primary MD for the patient is [name], MD  Outpatient specialists:   LOS - 2  days   Medical records reviewed and are as summarized below:    Chief Complaint  Patient presents with  . Cough       Brief summary   Patient is a 74 year old female with history of ESRD on HD, MWF, DM type II, paroxysmal A. fib, gout had been experiencing worsening shortness of breath with productive cough generalized weakness and vomiting and diarrhea for the last week.  Patient has missed her dialysis almost for last 2 sessions because patient states there was some change in the schedule and when she went last Monday about 3 days ago she had a long wait time and had to go back home.  Patient reported that her family members at home has been diagnosed with Covid infection.  Patient has been having productive cough, and diarrhea.  Denies chest pain. In ED, was hypoxic requiring 4 L O2.  Chest x-ray showed bilateral infiltrates.  Covid test positive. Labs significant for hyperkalemia, EKG with peaked T waves, received Lokelma and nephrology was consulted. In ED, patient was hypoglycemic, was placed on D10.  Admitted for acute respiratory failure with hypoxia due to Covid infection, fluid overload due to missed HD.     Assessment & Plan    Acute hypoxic respiratory failure due to acute COVID-19 viral pneumonia during the ongoing COVID-19 pandemic- POA - Patient presented with productive cough, generalized weakness, nausea vomiting diarrhea, shortness of breath for a week.  Chest x-ray showed bilateral infiltrates.  Likely has combination of fluid overload due to missed hemodialysis x2 and COVID-19 - Improving with  HD x2 , cont IV steroids, remdesevir  - Continue Supportive care: vitamin C/zinc, albuterol, Tylenol. 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  -per renal: will need to dialyze in isolation for 21 days from positive Covid test on third shift MWF at [facility] upon discharge.  Paroxysmal atrial fibrillation -Rate currently controlled, continue amiodarone - continue Eliquis  GERD -Continue PPI    Chronic obstructive pulmonary disease (HCC) -Continue IV steroids, Incruse Ellipta, Ventolin inhaler -No acute wheezing  Pressure injury, POA -Sacrum with stage II, wound care per nursing  Obesity Estimated body mass index is 37.12 kg/m as calculated from the following:   Height as of this encounter: 4\' 8"  (1.422 m).   Weight as of this encounter: 75.1 kg.  - will need lifestyle changes and diet control  Code Status: Full CODE STATUS DVT Prophylaxis: Eliquis Family Communication: Discussed all imaging  results, lab results, explained to the patient. Called patient's son, [name] [phone], unable to make contact, left a detailed message on 12/17  Disposition Plan:     Status is: Inpatient  Remains inpatient appropriate because:Inpatient level of care appropriate due to severity of illness   Dispo: The patient is from: Home              Anticipated d/c is to: Home              Anticipated d/c date is: > 3 days              Patient currently is not medically stable to d/c.  Hypoxia, active Covid infection,     Time Spent in minutes 61mins   Procedures:  Hemodialysis  Consultants:   *Nephrology  Antimicrobials:   Anti-infectives (From admission, onward)   Start     Dose/Rate Route Frequency Ordered Stop   07/21/20 1000  remdesivir 100 mg in sodium chloride 0.9 % 100 mL IVPB       "Followed by" Linked Group Details   100 mg 200 mL/hr over 30 Minutes Intravenous Daily 07/20/20 0142 07/25/20 0959   07/21/20 1000  remdesivir 100 mg in sodium chloride 0.9 % 100 mL IVPB  Status:  Discontinued       "Followed by" Linked Group Details   100 mg 200 mL/hr over 30 Minutes Intravenous Daily 07/20/20 0546 07/20/20 0551   07/20/20 0600  remdesivir 200 mg in sodium chloride 0.9% 250 mL IVPB  Status:  Discontinued       "Followed by" Linked Group Details   200 mg 580 mL/hr over 30 Minutes Intravenous Once 07/20/20 0546 07/20/20 0551   07/20/20 0230  remdesivir 200 mg in sodium chloride 0.9% 250 mL IVPB       "Followed by" Linked Group Details   200 mg 580 mL/hr over 30 Minutes Intravenous Once 07/20/20 0142 07/20/20 0402         Medications  Scheduled Meds: . allopurinol  100 mg Oral BID  . amiodarone  200 mg Oral Daily  . apixaban  2.5 mg Oral BID  . vitamin C  500 mg Oral Daily  . aspirin EC  81 mg Oral Daily  . atorvastatin  40 mg Oral QHS  . Chlorhexidine Gluconate Cloth  6 each Topical Q0600  . ferric citrate  210 mg Oral BID WC  . fluticasone furoate-vilanterol   1 puff Inhalation Daily  . gabapentin  100 mg Oral QHS  . insulin aspart  0-6 Units Subcutaneous TID WC  . methylPREDNISolone (SOLU-MEDROL) injection  60 mg Intravenous Q12H  .  midodrine  10 mg Oral TID WC  . pantoprazole  40 mg Oral Daily  . umeclidinium bromide  1 puff Inhalation Daily  . zinc sulfate  220 mg Oral Daily   Continuous Infusions: . remdesivir 100 mg in NS 100 mL 100 mg (07/22/20 1314)   PRN Meds:.albuterol, chlorpheniramine-HYDROcodone, guaiFENesin-dextromethorphan, nitroGLYCERIN, ondansetron (ZOFRAN) IV, sodium chloride flush, traMADol      Subjective:   [name] was seen and examined today.  Seen during HD, shortness of breath improving.   No chest pain, abdominal pain, nausea vomiting or diarrhea.  No fevers   Objective:   Vitals:   07/22/20 1030 07/22/20 1100 07/22/20 1130 07/22/20 1140  BP: (!) 111/51 (!) 116/59 98/64 (!) 112/58  Pulse: (!) 48 (!) 55 (!) 59 (!) 59  Resp:    (!) 22  Temp:    97.8 F (36.6 C)  TempSrc:    Axillary  SpO2:    (!) 2%  Weight:    75.1 kg  Height:        Intake/Output Summary (Last 24 hours) at 07/22/2020 1616 Last data filed at 07/22/2020 1300 Gross per 24 hour  Intake 480 ml  Output 2500 ml  Net -2020 ml     Wt Readings from Last 3 Encounters:  07/22/20 75.1 kg  04/28/20 78.3 kg  04/07/20 75.3 kg     Physical Exam  General: Alert and oriented x 3, NAD  Cardiovascular: S1 S2 clear, RRR. No pedal edema b/l  Respiratory: dec BS at bases, no wheezing   Gastrointestinal: Soft, nontender, nondistended, NBS  Ext: no pedal edema bilaterally  Neuro: no new deficits  Musculoskeletal: No cyanosis, clubbing  Skin: No rashes  Psych: Normal affect and demeanor, alert and oriented x3       Data Reviewed:  I have personally reviewed following labs and imaging studies  Micro Results Recent Results (from the past 240 hour(s))  Resp Panel by RT-PCR (Flu A&B, Covid) Nasopharyngeal Swab     Status:  Abnormal   Collection Time: 07/19/20 11:02 PM   Specimen: Nasopharyngeal Swab; Nasopharyngeal(NP) swabs in vial transport medium  Result Value Ref Range Status   SARS Coronavirus 2 by RT PCR POSITIVE (A) NEGATIVE Final    Comment: RESULT CALLED TO, READ BACK BY AND VERIFIED WITH: C CRISCO RN 07/20/20 0047 (NOTE) SARS-CoV-2 target nucleic acids are DETECTED.  The SARS-CoV-2 RNA is generally detectable in upper respiratory specimens during the acute phase of infection. Positive results are indicative of the presence of the identified virus, but do not rule out bacterial infection or co-infection with other pathogens not detected by the test. Clinical correlation with patient history and other diagnostic information is necessary to determine patient infection status. The expected result is Negative.  Fact Sheet for Patients: EntrepreneurPulse.com.au  Fact Sheet for Healthcare Providers: IncredibleEmployment.be  This test is not yet approved or cleared by the [place] FDA and  has been authorized for detection and/or diagnosis of SARS-CoV-2 by FDA under an Emergency Use Authorization (EUA).  This EUA will remain in effect (meaning this test can be used) f or the duration of  the COVID-19 declaration under Section 564(b)(1) of the Act, 21 U.S.C. section 360bbb-3(b)(1), unless the authorization is terminated or revoked sooner.     Influenza A by PCR NEGATIVE NEGATIVE Final   Influenza B by PCR NEGATIVE NEGATIVE Final    Comment: (NOTE) The Xpert Xpress SARS-CoV-2/FLU/RSV plus assay is intended as an aid in the diagnosis of influenza  from Nasopharyngeal swab specimens and should not be used as a sole basis for treatment. Nasal washings and aspirates are unacceptable for Xpert Xpress SARS-CoV-2/FLU/RSV testing.  Fact Sheet for Patients: EntrepreneurPulse.com.au  Fact Sheet for Healthcare  Providers: IncredibleEmployment.be  This test is not yet approved or cleared by the [place] FDA and has been authorized for detection and/or diagnosis of SARS-CoV-2 by FDA under an Emergency Use Authorization (EUA). This EUA will remain in effect (meaning this test can be used) for the duration of the COVID-19 declaration under Section 564(b)(1) of the Act, 21 U.S.C. section 360bbb-3(b)(1), unless the authorization is terminated or revoked.  Performed at [facility], [place] [address], [place], [place] [address]     Radiology Reports DG Chest Portable 1 View  Result Date: 07/19/2020 CLINICAL DATA:  Cough for 2 weeks.  Possible COVID. EXAM: PORTABLE CHEST 1 VIEW COMPARISON:  Radiograph 04/24/2020, CT 07/17/2006 FINDINGS: Left-sided dialysis catheter and right chest port remain in place. Lower lung volumes from prior exam. Stable heart size and mediastinal contours. Right pericardiac density corresponds to hiatal hernia on prior CT. Minimal patchy opacity at both lung bases. No significant pleural effusion. No pneumothorax. Vascular stent in the left upper arm. IMPRESSION: Minimal patchy opacity at both lung bases, may represent atelectasis or pneumonia. Electronically Signed   By: Keith Rake M.D.   On: 07/19/2020 23:28    Lab Data:  CBC: Recent Labs  Lab 07/19/20 2254 07/20/20 1530 07/21/20 0850 07/22/20 0403  WBC 5.2 6.2 4.7 4.6  NEUTROABS  --   --  3.4 3.8  HGB 13.0 11.7* 11.9* 12.3  HCT 41.9 36.6 37.5 37.1  MCV 97.7 93.4 91.9 92.3  PLT 166 148* 145* 761   Basic Metabolic Panel: Recent Labs  Lab 07/19/20 2254 07/20/20 1530 07/21/20 0850 07/22/20 0403  NA 141 137 138 138  K 6.8* >7.5* 4.4 5.8*  CL 102 96* 96* 97*  CO2 18* 20* 26 28  GLUCOSE 76 202* 83 167*  BUN 57* 56* 20 34*  CREATININE 12.99* 13.51* 7.15* 8.50*  CALCIUM 6.3* 5.9* 7.4* 7.1*  PHOS  --  7.8*  --   --    GFR: Estimated Creatinine Clearance: 4.7 mL/min (A) (by  C-G formula based on SCr of 8.5 mg/dL (H)). Liver Function Tests: Recent Labs  Lab 07/20/20 1530 07/21/20 0850 07/22/20 0403  AST 18 16 13*  ALT 10 10 8   ALKPHOS 56 54 50  BILITOT 0.6 0.8 0.6  PROT 5.9* 6.2* 5.9*  ALBUMIN 2.8* 3.1* 2.9*   No results for input(s): LIPASE, AMYLASE in the last 168 hours. No results for input(s): AMMONIA in the last 168 hours. Coagulation Profile: No results for input(s): INR, PROTIME in the last 168 hours. Cardiac Enzymes: No results for input(s): CKTOTAL, CKMB, CKMBINDEX, TROPONINI in the last 168 hours. BNP (last 3 results) No results for input(s): PROBNP in the last 8760 hours. HbA1C: No results for input(s): HGBA1C in the last 72 hours. CBG: Recent Labs  Lab 07/21/20 1210 07/21/20 1758 07/21/20 2036 07/22/20 0740 07/22/20 1250  GLUCAP 112* 159* 235* 115* 120*   Lipid Profile: No results for input(s): CHOL, HDL, LDLCALC, TRIG, CHOLHDL, LDLDIRECT in the last 72 hours. Thyroid Function Tests: No results for input(s): TSH, T4TOTAL, FREET4, T3FREE, THYROIDAB in the last 72 hours. Anemia Panel: No results for input(s): VITAMINB12, FOLATE, FERRITIN, TIBC, IRON, RETICCTPCT in the last 72 hours. Urine analysis:    Component Value Date/Time   COLORURINE YELLOW  04/24/2020 1528   APPEARANCEUR HAZY (A) 04/24/2020 1528   LABSPEC 1.013 04/24/2020 1528   PHURINE 7.0 04/24/2020 1528   GLUCOSEU NEGATIVE 04/24/2020 1528   HGBUR NEGATIVE 04/24/2020 [place] 04/24/2020 1528   KETONESUR NEGATIVE 04/24/2020 1528   PROTEINUR 100 (A) 04/24/2020 1528   UROBILINOGEN 0.2 01/29/2007 1500   NITRITE NEGATIVE 04/24/2020 [place] 04/24/2020 1528     Holliday Sheaffer M.D. [facility] 07/22/2020, 4:16 PM   Call night coverage person covering after 7pm

## 2020-07-22 NOTE — Progress Notes (Signed)
[facility] Progress Note  Subjective:  Pt seen on dialysis. No c/o today.   Vitals:   07/22/20 1100 07/22/20 1130 07/22/20 1140 07/22/20 1652  BP: (!) 116/59 98/64 (!) 112/58 (!) 127/58  Pulse: (!) 55 (!) 59 (!) 59 (!) 50  Resp:   (!) 22 14  Temp:   97.8 F (36.6 C) 97.8 F (36.6 C)  TempSrc:   Axillary Oral  SpO2:   (!) 2% 93%  Weight:   75.1 kg   Height:        Exam:   alert, nad   no jvd  Chest cta bilat  Cor irreg rhythm, no RG  Abd soft ntnd no ascites obese   Ext 1+  LE edema   Alert, NF, ox3   L IJ TDC, left arm AVF+bruit     CXR - IMPRESSION:  Minimal patchy opacity at both lung bases, may represent atelectasis or pneumonia.   OP HD: MWF [facility] (COVID) after DC   4h  77kg  P4  Hep none  2/2.5 bath  TDC/ LUA AVF   Calc 1.75   mircera 75 q 2 venofer 100   Midodrine ?   Assessment/ Plan: 1. COVID -treatment per primary. 2. ESRD: TTS HD.  K+ 5.8 today. HD today off schedule, then resume MWF.   3. HD access: has TDC and AVF. AVF has been used but she has been infiltrated multiple times and the plan per [facility] was AVG.  3. Hypotension/ volume  - will need  Midodrine before HD (10mg  TID). She is 2kg under dry wt now. BP's low to normal, edema resolved. 5-6 L removed w/ HD x 2 here.   4. Anemia of ESRD:hgb over 10- No ESA needed at present.  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 COPD/ OSA 7. Afib on eliquis 8. Gout 9. DM2  Rob Banyan Goodchild 07/22/2020, 6:18 PM   Recent Labs  Lab 07/20/20 1530 07/21/20 0850 07/22/20 0403  K >7.5* 4.4 5.8*  BUN 56* 20 34*  CREATININE 13.51* 7.15* 8.50*  CALCIUM 5.9* 7.4* 7.1*  PHOS 7.8*  --   --   HGB 11.7* 11.9* 12.3   Inpatient medications: . allopurinol  100 mg Oral BID  . amiodarone  200 mg Oral Daily  . apixaban  2.5 mg Oral BID  . vitamin C  500 mg Oral Daily  . aspirin EC  81 mg Oral Daily  . atorvastatin  40 mg Oral QHS  . Chlorhexidine Gluconate Cloth  6 each Topical Q0600   . ferric citrate  210 mg Oral BID WC  . fluticasone furoate-vilanterol  1 puff Inhalation Daily  . gabapentin  100 mg Oral QHS  . insulin aspart  0-6 Units Subcutaneous TID WC  . methylPREDNISolone (SOLU-MEDROL) injection  60 mg Intravenous Q12H  . midodrine  10 mg Oral TID WC  . pantoprazole  40 mg Oral Daily  . umeclidinium bromide  1 puff Inhalation Daily  . zinc sulfate  220 mg Oral Daily   . remdesivir 100 mg in NS 100 mL 100 mg (07/22/20 1314)   albuterol, chlorpheniramine-HYDROcodone, guaiFENesin-dextromethorphan, nitroGLYCERIN, ondansetron (ZOFRAN) IV, sodium chloride flush, traMADol

## 2020-07-23 LAB — COMPREHENSIVE METABOLIC PANEL
ALT: 9 U/L (ref 0–44)
AST: 13 U/L — ABNORMAL LOW (ref 15–41)
Albumin: 3 g/dL — ABNORMAL LOW (ref 3.5–5.0)
Alkaline Phosphatase: 48 U/L (ref 38–126)
Anion gap: 12 (ref 5–15)
BUN: 25 mg/dL — ABNORMAL HIGH (ref 8–23)
CO2: 27 mmol/L (ref 22–32)
Calcium: 7.5 mg/dL — ABNORMAL LOW (ref 8.9–10.3)
Chloride: 96 mmol/L — ABNORMAL LOW (ref 98–111)
Creatinine, Ser: 5.77 mg/dL — ABNORMAL HIGH (ref 0.44–1.00)
GFR, Estimated: 7 mL/min — ABNORMAL LOW (ref >60)
Glucose, Bld: 183 mg/dL — ABNORMAL HIGH (ref 70–99)
Potassium: 5.3 mmol/L — ABNORMAL HIGH (ref 3.5–5.1)
Sodium: 135 mmol/L (ref 135–145)
Total Bilirubin: 0.4 mg/dL (ref 0.3–1.2)
Total Protein: 6.1 g/dL — ABNORMAL LOW (ref 6.5–8.1)

## 2020-07-23 LAB — CBC WITH DIFFERENTIAL/PLATELET
Abs Immature Granulocytes: 0.04 10*3/uL (ref 0.00–0.07)
Basophils Absolute: 0 10*3/uL (ref 0.0–0.1)
Basophils Relative: 0 %
Eosinophils Absolute: 0 10*3/uL (ref 0.0–0.5)
Eosinophils Relative: 0 %
HCT: 39.6 % (ref 36.0–46.0)
Hemoglobin: 12.5 g/dL (ref 12.0–15.0)
Immature Granulocytes: 1 %
Lymphocytes Relative: 10 %
Lymphs Abs: 0.5 10*3/uL — ABNORMAL LOW (ref 0.7–4.0)
MCH: 29.5 pg (ref 26.0–34.0)
MCHC: 31.6 g/dL (ref 30.0–36.0)
MCV: 93.4 fL (ref 80.0–100.0)
Monocytes Absolute: 0.1 10*3/uL (ref 0.1–1.0)
Monocytes Relative: 3 %
Neutro Abs: 4.3 10*3/uL (ref 1.7–7.7)
Neutrophils Relative %: 86 %
Platelets: 191 10*3/uL (ref 150–400)
RBC: 4.24 MIL/uL (ref 3.87–5.11)
RDW: 14.5 % (ref 11.5–15.5)
WBC: 4.9 10*3/uL (ref 4.0–10.5)
nRBC: 0 % (ref 0.0–0.2)

## 2020-07-23 LAB — C-REACTIVE PROTEIN: CRP: 1.4 mg/dL — ABNORMAL HIGH (ref <1.0)

## 2020-07-23 LAB — D-DIMER, QUANTITATIVE: D-Dimer, Quant: 0.44 ug/mL-FEU (ref 0.00–0.50)

## 2020-07-23 LAB — GLUCOSE, CAPILLARY
Glucose-Capillary: 151 mg/dL — ABNORMAL HIGH (ref 70–99)
Glucose-Capillary: 208 mg/dL — ABNORMAL HIGH (ref 70–99)
Glucose-Capillary: 182 mg/dL — ABNORMAL HIGH (ref 70–99)
Glucose-Capillary: 333 mg/dL — ABNORMAL HIGH (ref 70–99)

## 2020-07-23 MED ORDER — CHLORHEXIDINE GLUCONATE CLOTH 2 % EX PADS
6.0000 | MEDICATED_PAD | Freq: Every day | CUTANEOUS | Status: DC
  Administered 2020-07-24 – 2020-07-27 (×4): 6 via TOPICAL

## 2020-07-23 MED ORDER — SODIUM ZIRCONIUM CYCLOSILICATE 10 G PO PACK
10.0000 g | PACK | Freq: Every day | ORAL | Status: DC
  Administered 2020-07-23 – 2020-07-25 (×3): 10 g via ORAL
  Filled 2020-07-23 (×3): qty 1

## 2020-07-23 NOTE — Progress Notes (Signed)
[facility]                                                                              Patient Demographics  [name], is a 74 y.o. female, DOB - 12-04-1945, XTG:[id number]  Admit date - 07/19/2020   Admitting Physician [name], MD  Outpatient Primary MD for the patient is [name], MD  Outpatient specialists:   LOS - 3  days   Medical records reviewed and are as summarized below:    Chief Complaint  Patient presents with  . Cough       Brief summary   Patient is a 74 year old female with history of ESRD on HD, MWF, DM type II, paroxysmal A. fib, gout had been experiencing worsening shortness of breath with productive cough generalized weakness and vomiting and diarrhea for the last week.  Patient has missed her dialysis almost for last 2 sessions because patient states there was some change in the schedule and when she went last Monday about 3 days ago she had a long wait time and had to go back home.  Patient reported that her family members at home has been diagnosed with Covid infection.  Patient has been having productive cough, and diarrhea.  Denies chest pain. In ED, was hypoxic requiring 4 L O2.  Chest x-ray showed bilateral infiltrates.  Covid test positive. Labs significant for hyperkalemia, EKG with peaked T waves, received Lokelma and nephrology was consulted. In ED, patient was hypoglycemic, was placed on D10.  Admitted for acute respiratory failure with hypoxia due to Covid infection, fluid overload due to missed HD.   Subjective:   [name] was seen and examined today.  Does report some mild dyspnea, cough and congestion, she denies any chest pain or fever    Assessment & Plan    Acute hypoxic respiratory failure due to acute COVID-19 viral pneumonia during the ongoing COVID-19 pandemic- POA - Patient presented with productive cough, generalized weakness, nausea vomiting diarrhea, shortness of breath for  a week.  Chest x-ray showed bilateral infiltrates.  Likely has combination of fluid overload due to missed hemodialysis x2 and COVID-19 - Improving with HD x2 , cont IV steroids, remdesevir  - Continue Supportive care: vitamin C/zinc, albuterol, Tylenol. -Reports some congestion, she was encouraged use incentive spirometry, flutter valve, and out of bed to chair. 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  -per renal: will need to dialyze in isolation for 21 days from positive Covid test on third shift MWF at [facility] upon discharge.  Paroxysmal atrial fibrillation -Rate currently controlled, continue amiodarone - continue Eliquis  GERD -Continue PPI    Chronic obstructive pulmonary disease (HCC) -Continue IV steroids, Incruse Ellipta, Ventolin inhaler -No acute wheezing  Pressure injury, POA -Sacrum with stage II, wound care per nursing  Obesity Estimated body mass index is 36.67 kg/m as  calculated from the following:   Height as of this encounter: 4\' 8"  (1.422 m).   Weight as of this encounter: 74.2 kg.  - will need lifestyle changes and diet control  Code Status: Full CODE STATUS DVT Prophylaxis: Eliquis  Disposition Plan:     Status is: Inpatient  Remains inpatient appropriate because:Inpatient level of care appropriate due to severity of illness   Dispo: The patient is from: Home              Anticipated d/c is to: Home              Anticipated d/c date is: > 3 days              Patient currently is not medically stable to d/c.  Hypoxia, active Covid infection,      Procedures:  Hemodialysis  Consultants:   *Nephrology  Antimicrobials:   Anti-infectives (From admission, onward)   Start     Dose/Rate Route Frequency Ordered Stop   07/21/20 1000  remdesivir 100 mg in sodium chloride 0.9 % 100 mL IVPB       "Followed by" Linked Group Details   100 mg 200 mL/hr over 30 Minutes Intravenous Daily 07/20/20 0142 07/25/20 0959   07/21/20 1000  remdesivir 100 mg in sodium chloride 0.9 % 100 mL IVPB  Status:  Discontinued       "Followed by" Linked Group Details   100 mg 200 mL/hr over 30 Minutes Intravenous Daily 07/20/20 0546 07/20/20 0551   07/20/20 0600  remdesivir 200 mg in sodium chloride 0.9% 250 mL IVPB  Status:  Discontinued       "Followed by" Linked Group Details   200 mg 580 mL/hr over 30 Minutes Intravenous Once 07/20/20 0546 07/20/20 0551   07/20/20 0230  remdesivir 200 mg in sodium chloride 0.9% 250 mL IVPB       "Followed by" Linked Group Details   200 mg 580 mL/hr over 30 Minutes Intravenous Once 07/20/20 0142 07/20/20 0402         Medications  Scheduled Meds: . allopurinol  100 mg Oral BID  . amiodarone  200 mg Oral Daily  . apixaban  2.5 mg Oral BID  . vitamin C  500 mg Oral Daily  . aspirin EC  81 mg Oral Daily  . atorvastatin  40 mg Oral QHS  . ferric citrate  210 mg Oral BID WC  . fluticasone furoate-vilanterol  1 puff  Inhalation Daily  . gabapentin  100 mg Oral QHS  . insulin aspart  0-6 Units Subcutaneous TID WC  . methylPREDNISolone (SOLU-MEDROL) injection  60 mg Intravenous Q12H  . midodrine  10 mg Oral TID WC  . pantoprazole  40 mg Oral Daily  . sodium zirconium cyclosilicate  10 g Oral Daily  . umeclidinium bromide  1 puff Inhalation Daily  . zinc sulfate  220 mg Oral Daily   Continuous Infusions: . remdesivir 100 mg in NS 100 mL 100 mg (07/23/20 0849)   PRN Meds:.albuterol, chlorpheniramine-HYDROcodone, guaiFENesin-dextromethorphan, nitroGLYCERIN, ondansetron (ZOFRAN) IV, sodium chloride flush, traMADol       Objective:   Vitals:   07/23/20 0333 07/23/20 0400 07/23/20 0800 07/23/20 1200  BP:  (!) 131/44 (!) 150/85   Pulse:  (!) 45 60 60  Resp:  18 15 15   Temp:  98.4 F (36.9 C) 98 F (36.7 C) 99 F (37.2 C)  TempSrc:  Oral Oral Oral  SpO2:  95% 92%   Weight: 74.2 kg     Height:       No intake or output data in the 24 hours ending 07/23/20 1401   Wt Readings from Last 3 Encounters:  07/23/20 74.2 kg  04/28/20 78.3 kg  04/07/20 75.3 kg     Physical Exam  General: Alert and oriented x 3, NAD  Cardiovascular: S1 S2 clear, RRR. No pedal edema b/l  Respiratory: dec BS at bases, no wheezing   Gastrointestinal: Soft, nontender, nondistended, NBS  Ext: no pedal edema bilaterally  Neuro: no new deficits  Musculoskeletal: No cyanosis, clubbing  Skin: No rashes  Psych: Normal affect and demeanor, alert and oriented x3       Data Reviewed:  I have personally reviewed following labs and imaging studies  Micro Results Recent Results (from the past 240 hour(s))  Resp Panel by RT-PCR (Flu A&B, Covid) Nasopharyngeal Swab     Status: Abnormal   Collection Time: 07/19/20 11:02 PM   Specimen: Nasopharyngeal Swab; Nasopharyngeal(NP) swabs in vial transport medium  Result Value Ref Range Status   SARS Coronavirus 2 by RT PCR POSITIVE (A) NEGATIVE Final    Comment:  RESULT CALLED TO, READ BACK BY AND VERIFIED WITH: C CRISCO RN 07/20/20 0047 (NOTE) SARS-CoV-2 target nucleic acids are DETECTED.  The SARS-CoV-2 RNA is generally detectable in upper respiratory specimens during the acute phase of infection. Positive results are indicative of the presence of the identified virus, but do not rule out bacterial infection or co-infection with other pathogens not detected by the test. Clinical correlation with patient history and other diagnostic information is necessary to determine patient infection status. The expected result is Negative.  Fact Sheet for Patients: EntrepreneurPulse.com.au  Fact Sheet for Healthcare Providers: IncredibleEmployment.be  This test is not yet approved or cleared by the [place] FDA and  has been authorized for detection and/or diagnosis of SARS-CoV-2 by FDA under an Emergency Use Authorization (EUA).  This EUA will remain in effect (meaning this test can be used) f or the duration of  the COVID-19 declaration under Section 564(b)(1) of the Act, 21 U.S.C. section 360bbb-3(b)(1), unless the authorization is terminated or revoked sooner.     Influenza A by PCR NEGATIVE NEGATIVE Final   Influenza B by PCR NEGATIVE NEGATIVE Final    Comment: (NOTE) The Xpert Xpress SARS-CoV-2/FLU/RSV plus assay is intended as an aid in the diagnosis of influenza from Nasopharyngeal swab specimens and should not be used as a sole basis for treatment. Nasal washings and aspirates are unacceptable for Xpert Xpress SARS-CoV-2/FLU/RSV testing.  Fact Sheet for Patients: EntrepreneurPulse.com.au  Fact Sheet for Healthcare Providers:  IncredibleEmployment.be  This test is not yet approved or cleared by the [place] and has been authorized for detection and/or diagnosis of SARS-CoV-2 by FDA under an Emergency Use Authorization (EUA). This EUA will remain in  effect (meaning this test can be used) for the duration of the COVID-19 declaration under Section 564(b)(1) of the Act, 21 U.S.C. section 360bbb-3(b)(1), unless the authorization is terminated or revoked.  Performed at [facility], [address], [place], [place] [address]     Radiology Reports DG Chest Portable 1 View  Result Date: 07/19/2020 CLINICAL DATA:  Cough for 2 weeks.  Possible COVID. EXAM: PORTABLE CHEST 1 VIEW COMPARISON:  Radiograph 04/24/2020, CT 07/17/2006 FINDINGS: Left-sided dialysis catheter and right chest port remain in place. Lower lung volumes from prior exam. Stable heart size and mediastinal contours. Right pericardiac density corresponds to hiatal hernia on prior CT. Minimal patchy opacity at both lung bases. No significant pleural effusion. No pneumothorax. Vascular stent in the left upper arm. IMPRESSION: Minimal patchy opacity at both lung bases, may represent atelectasis or pneumonia. Electronically Signed   By: Keith Rake M.D.   On: 07/19/2020 23:28    Lab Data:  CBC: Recent Labs  Lab 07/19/20 2254 07/20/20 1530 07/21/20 0850 07/22/20 0403 07/23/20 0452  WBC 5.2 6.2 4.7 4.6 4.9  NEUTROABS  --   --  3.4 3.8 4.3  HGB 13.0 11.7* 11.9* 12.3 12.5  HCT 41.9 36.6 37.5 37.1 39.6  MCV 97.7 93.4 91.9 92.3 93.4  PLT 166 148* 145* 171 283   Basic Metabolic Panel: Recent Labs  Lab 07/19/20 2254 07/20/20 1530 07/21/20 0850 07/22/20 0403 07/23/20 0452  NA 141 137 138 138 135  K 6.8* >7.5* 4.4 5.8* 5.3*  CL 102 96* 96* 97* 96*  CO2 18* 20* 26 28 27   GLUCOSE 76 202* 83 167* 183*  BUN 57* 56* 20 34* 25*  CREATININE 12.99* 13.51* 7.15* 8.50* 5.77*  CALCIUM 6.3* 5.9* 7.4* 7.1* 7.5*  PHOS  --  7.8*  --   --   --    GFR: Estimated Creatinine Clearance: 6.8 mL/min (A) (by C-G formula based on SCr of 5.77 mg/dL (H)). Liver Function Tests: Recent Labs  Lab 07/20/20 1530 07/21/20 0850 07/22/20 0403 07/23/20 0452  AST 18 16 13* 13*  ALT  10 10 8 9   ALKPHOS 56 54 50 48  BILITOT 0.6 0.8 0.6 0.4  PROT 5.9* 6.2* 5.9* 6.1*  ALBUMIN 2.8* 3.1* 2.9* 3.0*   No results for input(s): LIPASE, AMYLASE in the last 168 hours. No results for input(s): AMMONIA in the last 168 hours. Coagulation Profile: No results for input(s): INR, PROTIME in the last 168 hours. Cardiac Enzymes: No results for input(s): CKTOTAL, CKMB, CKMBINDEX, TROPONINI in the last 168 hours. BNP (last 3 results) No results for input(s): PROBNP in the last 8760 hours. HbA1C: No results for input(s): HGBA1C in the last 72 hours. CBG: Recent Labs  Lab 07/22/20 1250 07/22/20 1651 07/22/20 2138 07/23/20 0732 07/23/20 1220  GLUCAP 120* 152* 223* 151* 208*   Lipid Profile: No results for input(s): CHOL, HDL, LDLCALC, TRIG, CHOLHDL, LDLDIRECT in the last 72 hours. Thyroid Function Tests: No results for input(s): TSH, T4TOTAL, FREET4, T3FREE, THYROIDAB in the last 72 hours. Anemia Panel: No results for input(s): VITAMINB12, FOLATE, FERRITIN, TIBC, IRON, RETICCTPCT in the last 72 hours. Urine analysis:    Component Value Date/Time   COLORURINE YELLOW 04/24/2020 1528   APPEARANCEUR HAZY (A) 04/24/2020 1528  LABSPEC 1.013 04/24/2020 1528   PHURINE 7.0 04/24/2020 1528   GLUCOSEU NEGATIVE 04/24/2020 1528   HGBUR NEGATIVE 04/24/2020 [place] 04/24/2020 1528   KETONESUR NEGATIVE 04/24/2020 1528   PROTEINUR 100 (A) 04/24/2020 1528   UROBILINOGEN 0.2 01/29/2007 1500   NITRITE NEGATIVE 04/24/2020 [place] 04/24/2020 [place] M.D. [facility] 07/23/2020, 2:01 PM   Call night coverage person covering after 7pm

## 2020-07-23 NOTE — Progress Notes (Signed)
[facility] Progress Note  Subjective:  Pt seen in room. Coughing, no other c/o's.    Vitals:   07/23/20 0333 07/23/20 0400 07/23/20 0800 07/23/20 1200  BP:  (!) 131/44 (!) 150/85   Pulse:  (!) 45 60 60  Resp:  18 15 15   Temp:  98.4 F (36.9 C) 98 F (36.7 C) 99 F (37.2 C)  TempSrc:  Oral Oral Oral  SpO2:  95% 92%   Weight: 74.2 kg     Height:        Exam:   alert, nad   no jvd  Chest cta bilat  Cor irreg rhythm, no RG  Abd soft ntnd no ascites obese   Ext 1+  LE edema   Alert, NF, ox3   L IJ TDC, left arm AVF+bruit     CXR - IMPRESSION:  Minimal patchy opacity at both lung bases, may represent atelectasis or pneumonia.   OP HD: MWF [facility] (COVID) after DC   4h  77kg  P4  Hep none  2/2.5 bath  TDC/ LUA AVF   Calc 1.75   mircera 75 q 2 venofer 100   Midodrine ?   Assessment/ Plan: 1. COVID -treatment per primary. 2. ESRD: MWF HD.  K+ down to 5.3 today. Will add Lokelma to renal diet and HD for high K+.  HD tomorrow.  3. HD access: has TDC and AVF. AVF has been used but she has been infiltrated multiple times and the plan per [facility] was AVG.  3. Hypotension/ volume  - was on midodrine 10 tid at home, continues here. 2-3 kg under dry wt. BP's low to normal, edema resolved. 5-6 L removed w/ HD x 2 here.  HD tomorrow.  4. Anemia of ESRD:hgb over 10- No ESA needed at present.  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 COPD/ OSA 7. Afib on eliquis 8. Gout 9. DM2  Rob Worthy [place]cher[place] 07/23/2020, 12:34 PM   Recent Labs  Lab 07/20/20 1530 07/21/20 0850 07/22/20 0403 07/23/20 0452  K >7.5*   < > 5.8* 5.3*  BUN 56*   < > 34* 25*  CREATININE 13.51*   < > 8.50* 5.77*  CALCIUM 5.9*   < > 7.1* 7.5*  PHOS 7.8*  --   --   --   HGB 11.7*   < > 12.3 12.5   < > = values in this interval not displayed.   Inpatient medications: . allopurinol  100 mg Oral BID  . amiodarone  200 mg Oral Daily  . apixaban  2.5 mg Oral BID  . vitamin C  500 mg Oral  Daily  . aspirin EC  81 mg Oral Daily  . atorvastatin  40 mg Oral QHS  . Chlorhexidine Gluconate Cloth  6 each Topical Q0600  . ferric citrate  210 mg Oral BID WC  . fluticasone furoate-vilanterol  1 puff Inhalation Daily  . gabapentin  100 mg Oral QHS  . insulin aspart  0-6 Units Subcutaneous TID WC  . methylPREDNISolone (SOLU-MEDROL) injection  60 mg Intravenous Q12H  . midodrine  10 mg Oral TID WC  . pantoprazole  40 mg Oral Daily  . umeclidinium bromide  1 puff Inhalation Daily  . zinc sulfate  220 mg Oral Daily   . remdesivir 100 mg in NS 100 mL 100 mg (07/23/20 0849)   albuterol, chlorpheniramine-HYDROcodone, guaiFENesin-dextromethorphan, nitroGLYCERIN, ondansetron (ZOFRAN) IV, sodium chloride flush, traMADol

## 2020-07-24 LAB — CBC WITH DIFFERENTIAL/PLATELET
Abs Immature Granulocytes: 0 10*3/uL (ref 0.00–0.07)
Basophils Absolute: 0 10*3/uL (ref 0.0–0.1)
Basophils Relative: 0 %
Eosinophils Absolute: 0 10*3/uL (ref 0.0–0.5)
Eosinophils Relative: 0 %
HCT: 40.2 % (ref 36.0–46.0)
Hemoglobin: 12.6 g/dL (ref 12.0–15.0)
Lymphocytes Relative: 4 %
Lymphs Abs: 0.2 10*3/uL — ABNORMAL LOW (ref 0.7–4.0)
MCH: 29.2 pg (ref 26.0–34.0)
MCHC: 31.3 g/dL (ref 30.0–36.0)
MCV: 93.1 fL (ref 80.0–100.0)
Monocytes Absolute: 0.1 10*3/uL (ref 0.1–1.0)
Monocytes Relative: 2 %
Neutro Abs: 5.8 10*3/uL (ref 1.7–7.7)
Neutrophils Relative %: 94 %
Platelets: 205 10*3/uL (ref 150–400)
RBC: 4.32 MIL/uL (ref 3.87–5.11)
RDW: 14.2 % (ref 11.5–15.5)
WBC: 6.2 10*3/uL (ref 4.0–10.5)
nRBC: 0 % (ref 0.0–0.2)
nRBC: 0 /100 WBC

## 2020-07-24 LAB — COMPREHENSIVE METABOLIC PANEL
ALT: 7 U/L (ref 0–44)
AST: 11 U/L — ABNORMAL LOW (ref 15–41)
Albumin: 3 g/dL — ABNORMAL LOW (ref 3.5–5.0)
Alkaline Phosphatase: 48 U/L (ref 38–126)
Anion gap: 16 — ABNORMAL HIGH (ref 5–15)
BUN: 49 mg/dL — ABNORMAL HIGH (ref 8–23)
CO2: 23 mmol/L (ref 22–32)
Calcium: 7.6 mg/dL — ABNORMAL LOW (ref 8.9–10.3)
Chloride: 100 mmol/L (ref 98–111)
Creatinine, Ser: 7.52 mg/dL — ABNORMAL HIGH (ref 0.44–1.00)
GFR, Estimated: 5 mL/min — ABNORMAL LOW (ref >60)
Glucose, Bld: 190 mg/dL — ABNORMAL HIGH (ref 70–99)
Potassium: 5.7 mmol/L — ABNORMAL HIGH (ref 3.5–5.1)
Sodium: 139 mmol/L (ref 135–145)
Total Bilirubin: 0.7 mg/dL (ref 0.3–1.2)
Total Protein: 5.9 g/dL — ABNORMAL LOW (ref 6.5–8.1)

## 2020-07-24 LAB — GLUCOSE, CAPILLARY
Glucose-Capillary: 165 mg/dL — ABNORMAL HIGH (ref 70–99)
Glucose-Capillary: 119 mg/dL — ABNORMAL HIGH (ref 70–99)
Glucose-Capillary: 158 mg/dL — ABNORMAL HIGH (ref 70–99)
Glucose-Capillary: 268 mg/dL — ABNORMAL HIGH (ref 70–99)

## 2020-07-24 LAB — D-DIMER, QUANTITATIVE: D-Dimer, Quant: 0.54 ug/mL-FEU — ABNORMAL HIGH (ref 0.00–0.50)

## 2020-07-24 LAB — C-REACTIVE PROTEIN: CRP: 1.2 mg/dL — ABNORMAL HIGH (ref <1.0)

## 2020-07-24 NOTE — Progress Notes (Signed)
PT Cancellation Note  Patient Details Name: [name] MRN: [id number] DOB: [birth year]   Cancelled Treatment:    Reason Eval/Treat Not Completed: (P) Patient at procedure or test/unavailable Pt is off the floor for hemodialysis. PT will follow back for Evaluation this afternoon as able.   Mirranda Monrroy B. Migdalia Dk PT, DPT Acute Rehabilitation Services Pager [phone] Office [phone]    [place] 07/24/2020, 8:21 AM

## 2020-07-24 NOTE — Progress Notes (Signed)
[facility]                                                                              Patient Demographics  [name], is a 74 y.o. female, DOB - [birth year], CNO:[id number]  Admit date - 07/19/2020   Admitting Physician [name], MD  Outpatient Primary MD for the patient is [name], MD  Outpatient specialists:   LOS - 4  days   Medical records reviewed and are as summarized below:    Chief Complaint  Patient presents with  . Cough       Brief summary   Patient is a 74 year old female with history of ESRD on HD, MWF, DM type II, paroxysmal A. fib, gout had been experiencing worsening shortness of breath with productive cough generalized weakness and vomiting and diarrhea for the last week.  Patient has missed her dialysis almost for last 2 sessions because patient states there was some change in the schedule and when she went last Monday about 3 days ago she had a long wait time and had to go back home.  Patient reported that her family members at home has been diagnosed with Covid infection.  Patient has been having productive cough, and diarrhea.  Denies chest pain. In ED, was hypoxic requiring 4 L O2.  Chest x-ray showed bilateral infiltrates.  Covid test positive. Labs significant for hyperkalemia, EKG with peaked T waves, received Lokelma and nephrology was consulted. In ED, patient was hypoglycemic, was placed on D10.  Admitted for acute respiratory failure with hypoxia due to Covid infection, fluid overload due to missed HD.   Subjective:   [name] was seen and examined today.  Reports cough has improved, no further congestion, reports she started hemodialysis very well.    Assessment & Plan    Acute hypoxic respiratory failure due to acute COVID-19 viral pneumonia during the ongoing COVID-19 pandemic- POA -He is vaccinated with [place] this March and April, but no booster yet. - Patient presented with productive  cough, generalized weakness, nausea vomiting diarrhea, shortness of breath for a week.  Chest x-ray showed bilateral infiltrates.  Likely has combination of fluid overload due to missed hemodialysis x2 and COVID-19 - Improving with HD  , cont IV steroids, remdesevir  - Continue Supportive care: vitamin C/zinc, albuterol, Tylenol. -Reports some congestion, she was encouraged use incentive spirometry, flutter valve, and out of bed to chair. 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  -per renal: will need to dialyze in isolation for 21 days from positive Covid test on third shift MWF at [facility] upon discharge.  Paroxysmal atrial fibrillation -Rate currently controlled, continue amiodarone - continue Eliquis  GERD -Continue PPI    Chronic obstructive pulmonary disease (HCC) -Continue IV steroids, Incruse Ellipta, Ventolin inhaler -No acute wheezing  Pressure  injury, POA -Sacrum with stage II, wound care per nursing  Obesity Estimated body mass index is 36.13 kg/m as calculated from the following:   Height as of this encounter: 4\' 8"  (1.422 m).   Weight as of this encounter: 73.1 kg.  - will need lifestyle changes and diet control  Code Status: Full CODE STATUS DVT Prophylaxis: Eliquis Family communication: Discussed with son by phone  Disposition Plan:     Status is: Inpatient  Remains inpatient appropriate because:Inpatient level of care appropriate due to severity of illness   Dispo: The patient is from: Home              Anticipated d/c is to: Home              Anticipated d/c date is: 1 day              Patient currently is not medically stable to d/c.  Hypoxia, active Covid infection,      Procedures:  Hemodialysis  Consultants:   *Nephrology  Antimicrobials:   Anti-infectives (From admission, onward)   Start     Dose/Rate Route Frequency Ordered Stop   07/21/20 1000  remdesivir 100 mg in sodium chloride 0.9 % 100 mL IVPB       "Followed by" Linked Group Details   100 mg 200 mL/hr over 30 Minutes Intravenous Daily 07/20/20 0142 07/25/20 0959   07/21/20 1000  remdesivir 100 mg in sodium chloride 0.9 % 100 mL IVPB  Status:  Discontinued       "Followed by" Linked Group Details   100 mg 200 mL/hr over 30 Minutes Intravenous Daily 07/20/20 0546 07/20/20 0551   07/20/20 0600  remdesivir 200 mg in sodium chloride 0.9% 250 mL IVPB  Status:  Discontinued       "Followed by" Linked Group Details   200 mg 580 mL/hr over 30 Minutes Intravenous Once 07/20/20 0546 07/20/20 0551   07/20/20 0230  remdesivir 200 mg in sodium chloride 0.9% 250 mL IVPB       "Followed by" Linked Group Details   200 mg 580 mL/hr over 30 Minutes Intravenous Once 07/20/20 0142 07/20/20 0402         Medications  Scheduled Meds: . allopurinol  100 mg Oral BID  . amiodarone  200 mg Oral Daily  . apixaban  2.5 mg Oral BID  . vitamin C  500  mg Oral Daily  . aspirin EC  81 mg Oral Daily  . atorvastatin  40 mg Oral QHS  . Chlorhexidine Gluconate Cloth  6 each Topical Daily  . ferric citrate  210 mg Oral BID WC  . fluticasone furoate-vilanterol  1 puff Inhalation Daily  .  gabapentin  100 mg Oral QHS  . insulin aspart  0-6 Units Subcutaneous TID WC  . methylPREDNISolone (SOLU-MEDROL) injection  60 mg Intravenous Q12H  . midodrine  10 mg Oral TID WC  . pantoprazole  40 mg Oral Daily  . sodium zirconium cyclosilicate  10 g Oral Daily  . umeclidinium bromide  1 puff Inhalation Daily  . zinc sulfate  220 mg Oral Daily   Continuous Infusions: . remdesivir 100 mg in NS 100 mL 100 mg (07/24/20 1500)   PRN Meds:.albuterol, chlorpheniramine-HYDROcodone, guaiFENesin-dextromethorphan, nitroGLYCERIN, ondansetron (ZOFRAN) IV, sodium chloride flush, traMADol       Objective:   Vitals:   07/24/20 1030 07/24/20 1100 07/24/20 1130 07/24/20 1200  BP: (!) 89/43 98/62 (!) 129/47 (!) 116/47  Pulse:    65  Resp: 17 16 16 13   Temp:   98.3 F (36.8 C)   TempSrc:   Oral   SpO2:   93% 97%  Weight:   73.1 kg   Height:        Intake/Output Summary (Last 24 hours) at 07/24/2020 1505 Last data filed at 07/24/2020 1359 Gross per 24 hour  Intake 120 ml  Output 916 ml  Net -796 ml     Wt Readings from Last 3 Encounters:  07/24/20 73.1 kg  04/28/20 78.3 kg  04/07/20 75.3 kg     Physical Exam  Awake Alert, Oriented X 3, No new F.N deficits, Normal affect Symmetrical Chest wall movement, Good air movement bilaterally, CTAB RRR,No Gallops,Rubs or new Murmurs, No Parasternal Heave +ve B.Sounds, Abd Soft, No tenderness, No rebound - guarding or rigidity. No Cyanosis, Clubbing or edema, No new Rash or bruise        Data Reviewed:  I have personally reviewed following labs and imaging studies  Micro Results Recent Results (from the past 240 hour(s))  Resp Panel by RT-PCR (Flu A&B, Covid) Nasopharyngeal Swab     Status:  Abnormal   Collection Time: 07/19/20 11:02 PM   Specimen: Nasopharyngeal Swab; Nasopharyngeal(NP) swabs in vial transport medium  Result Value Ref Range Status   SARS Coronavirus 2 by RT PCR POSITIVE (A) NEGATIVE Final    Comment: RESULT CALLED TO, READ BACK BY AND VERIFIED WITH: C CRISCO RN 07/20/20 0047 (NOTE) SARS-CoV-2 target nucleic acids are DETECTED.  The SARS-CoV-2 RNA is generally detectable in upper respiratory specimens during the acute phase of infection. Positive results are indicative of the presence of the identified virus, but do not rule out bacterial infection or co-infection with other pathogens not detected by the test. Clinical correlation with patient history and other diagnostic information is necessary to determine patient infection status. The expected result is Negative.  Fact Sheet for Patients: EntrepreneurPulse.com.au  Fact Sheet for Healthcare Providers: IncredibleEmployment.be  This test is not yet approved or cleared by the [place] FDA and  has been authorized for detection and/or diagnosis of SARS-CoV-2 by FDA under an Emergency Use Authorization (EUA).  This EUA will remain in effect (meaning this test can be used) f or the duration of  the COVID-19 declaration under Section 564(b)(1) of the Act, 21 U.S.C. section 360bbb-3(b)(1), unless the authorization is terminated or revoked sooner.     Influenza A by PCR NEGATIVE NEGATIVE Final   Influenza B by PCR NEGATIVE NEGATIVE Final    Comment: (NOTE) The Xpert Xpress SARS-CoV-2/FLU/RSV plus assay is intended as an aid in the diagnosis of influenza from Nasopharyngeal swab specimens and should not be used as a sole  basis for treatment. Nasal washings and aspirates are unacceptable for Xpert Xpress SARS-CoV-2/FLU/RSV testing.  Fact Sheet for Patients: EntrepreneurPulse.com.au  Fact Sheet for Healthcare  Providers: IncredibleEmployment.be  This test is not yet approved or cleared by the [place] FDA and has been authorized for detection and/or diagnosis of SARS-CoV-2 by FDA under an Emergency Use Authorization (EUA). This EUA will remain in effect (meaning this test can be used) for the duration of the COVID-19 declaration under Section 564(b)(1) of the Act, 21 U.S.C. section 360bbb-3(b)(1), unless the authorization is terminated or revoked.  Performed at [facility], [place] [address], [place], [place] [address]     Radiology Reports DG Chest Portable 1 View  Result Date: 07/19/2020 CLINICAL DATA:  Cough for 2 weeks.  Possible COVID. EXAM: PORTABLE CHEST 1 VIEW COMPARISON:  Radiograph 04/24/2020, CT 07/17/2006 FINDINGS: Left-sided dialysis catheter and right chest port remain in place. Lower lung volumes from prior exam. Stable heart size and mediastinal contours. Right pericardiac density corresponds to hiatal hernia on prior CT. Minimal patchy opacity at both lung bases. No significant pleural effusion. No pneumothorax. Vascular stent in the left upper arm. IMPRESSION: Minimal patchy opacity at both lung bases, may represent atelectasis or pneumonia. Electronically Signed   By: Keith Rake M.D.   On: 07/19/2020 23:28    Lab Data:  CBC: Recent Labs  Lab 07/20/20 1530 07/21/20 0850 07/22/20 0403 07/23/20 0452 07/24/20 0342  WBC 6.2 4.7 4.6 4.9 6.2  NEUTROABS  --  3.4 3.8 4.3 5.8  HGB 11.7* 11.9* 12.3 12.5 12.6  HCT 36.6 37.5 37.1 39.6 40.2  MCV 93.4 91.9 92.3 93.4 93.1  PLT 148* 145* 171 191 665   Basic Metabolic Panel: Recent Labs  Lab 07/20/20 1530 07/21/20 0850 07/22/20 0403 07/23/20 0452 07/24/20 0342  NA 137 138 138 135 139  K >7.5* 4.4 5.8* 5.3* 5.7*  CL 96* 96* 97* 96* 100  CO2 20* 26 28 27 23   GLUCOSE 202* 83 167* 183* 190*  BUN 56* 20 34* 25* 49*  CREATININE 13.51* 7.15* 8.50* 5.77* 7.52*  CALCIUM 5.9* 7.4* 7.1* 7.5*  7.6*  PHOS 7.8*  --   --   --   --    GFR: Estimated Creatinine Clearance: 5.2 mL/min (A) (by C-G formula based on SCr of 7.52 mg/dL (H)). Liver Function Tests: Recent Labs  Lab 07/20/20 1530 07/21/20 0850 07/22/20 0403 07/23/20 0452 07/24/20 0342  AST 18 16 13* 13* 11*  ALT 10 10 8 9 7   ALKPHOS 56 54 50 48 48  BILITOT 0.6 0.8 0.6 0.4 0.7  PROT 5.9* 6.2* 5.9* 6.1* 5.9*  ALBUMIN 2.8* 3.1* 2.9* 3.0* 3.0*   No results for input(s): LIPASE, AMYLASE in the last 168 hours. No results for input(s): AMMONIA in the last 168 hours. Coagulation Profile: No results for input(s): INR, PROTIME in the last 168 hours. Cardiac Enzymes: No results for input(s): CKTOTAL, CKMB, CKMBINDEX, TROPONINI in the last 168 hours. BNP (last 3 results) No results for input(s): PROBNP in the last 8760 hours. HbA1C: No results for input(s): HGBA1C in the last 72 hours. CBG: Recent Labs  Lab 07/23/20 1220 07/23/20 1638 07/23/20 2033 07/24/20 0723 07/24/20 1226  GLUCAP 208* 182* 333* 165* 119*   Lipid Profile: No results for input(s): CHOL, HDL, LDLCALC, TRIG, CHOLHDL, LDLDIRECT in the last 72 hours. Thyroid Function Tests: No results for input(s): TSH, T4TOTAL, FREET4, T3FREE, THYROIDAB in the last 72 hours. Anemia Panel: No results for input(s):  VITAMINB12, FOLATE, FERRITIN, TIBC, IRON, RETICCTPCT in the last 72 hours. Urine analysis:    Component Value Date/Time   COLORURINE YELLOW 04/24/2020 1528   APPEARANCEUR HAZY (A) 04/24/2020 1528   LABSPEC 1.013 04/24/2020 1528   PHURINE 7.0 04/24/2020 1528   GLUCOSEU NEGATIVE 04/24/2020 1528   HGBUR NEGATIVE 04/24/2020 1528   BILIRUBINUR NEGATIVE 04/24/2020 1528   KETONESUR NEGATIVE 04/24/2020 1528   PROTEINUR 100 (A) 04/24/2020 1528   UROBILINOGEN 0.2 01/29/2007 1500   NITRITE NEGATIVE 04/24/2020 [place] 04/24/2020 [place] M.D. [facility] 07/24/2020, 3:05 PM   Call night coverage person  covering after 7pm

## 2020-07-24 NOTE — Evaluation (Signed)
Physical Therapy Evaluation Patient Details Name: [name] MRN: [id number] DOB: 1946-01-10 Today's Date: 07/24/2020   History of Present Illness  74 year old female s/p SOB x1 week with chest discomfort found to have acute hyperkalemia with missed dailysis, COPDe and CHFe. PMHx: ESRD (MWF schedule), CAD, obstructive sleep apnea (on BIPAP QHS), HLD, GERD, DMT2, obesity, COPD, AFib. Present to ER 07/20/20 with hypoxia hypoxic requiring 4 L oxygen with chest x-ray showing bilateral infiltrates Covid test was positive. Admitted for treatment of COVID PNA and fluid overload.  Clinical Impression  PTA pt living with son in single story home with level entry. Pt reports ambulation with RW limited community distances, independence with bird baths and ADLs, and assists son with iADLs. Son drives her to dialysis, however pt also still drives. Pt is limited in safe mobility by decreased strength and endurance. Pt is min guard for bed mobility, and transfers and min A for ambulation of 18 feet with RW. PT recommending HHPT to improve safe navigation of her home environment at discharge. PT will continue to follow acutely.     Follow Up Recommendations Home health PT;Supervision/Assistance - 24 hour    Equipment Recommendations  None recommended by PT (pt has necessary equipment)       Precautions / Restrictions Precautions Precautions: Fall Restrictions Weight Bearing Restrictions: No      Mobility  Bed Mobility Overal bed mobility: Needs Assistance Bed Mobility: Supine to Sit     Supine to sit: Min guard;HOB elevated     General bed mobility comments: min guard for safety, increased time, effort and use of bed rail to come to sitting EoB    Transfers Overall transfer level: Needs assistance Equipment used: Rolling walker (2 wheeled) Transfers: Sit to/from Stand Sit to Stand: Min guard         General transfer comment: min guard for safety with power up and steadying at  [employer]  Ambulation/Gait Ambulation/Gait assistance: Min assist Gait Distance (Feet): 18 Feet Assistive device: Rolling walker (2 wheeled) Gait Pattern/deviations: Step-through pattern;Decreased step length - right;Decreased step length - left;Shuffle;Trunk flexed Gait velocity: slowed Gait velocity interpretation: <1.31 ft/sec, indicative of household ambulator General Gait Details: light min A for safety and steadying, to walk to sink to wash hands prior to lunch and return to recliner on other side of bed, slow, waddling gait, mild instability, no overt LoB        Balance Overall balance assessment: Needs assistance Sitting-balance support: Feet supported;No upper extremity supported Sitting balance-Leahy Scale: Fair     Standing balance support: During functional activity;No upper extremity supported;Bilateral upper extremity supported;Single extremity supported Standing balance-Leahy Scale: Poor Standing balance comment: needs some support, UE or lean on sink to maintain balance                             Pertinent Vitals/Pain Pain Assessment: Faces Faces Pain Scale: Hurts a little bit Pain Location: generalized with movement Pain Descriptors / Indicators: Grimacing Pain Intervention(s): Limited activity within patient's tolerance;Monitored during session;Repositioned    Home Living Family/patient expects to be discharged to:: Private residence Living Arrangements: Children Available Help at Discharge: Family;Available 24 hours/day Type of Home: House Home Access: Level entry     Home Layout: One level Home Equipment: Walker - 2 wheels;Walker - 4 wheels Additional Comments: uses 2L O2 when needed    Prior Function Level of Independence: Needs assistance   Gait / Transfers Assistance Needed: uses  RW for mobility   ADL's / Homemaking Assistance Needed: takes bird baths independently, assists with iADLs in the home  Comments: reports a few falls, son takes  her to dialysis, however she still drives     Hand Dominance   Dominant Hand: Right    Extremity/Trunk Assessment   Upper Extremity Assessment Upper Extremity Assessment: Defer to OT evaluation    Lower Extremity Assessment Lower Extremity Assessment: Generalized weakness    Cervical / Trunk Assessment Cervical / Trunk Assessment: Kyphotic  Communication   Communication: Other (comment) (decreased response to questions)  Cognition Arousal/Alertness: Awake/alert Behavior During Therapy: Flat affect (less than enthusiastic to work with therapy) Overall Cognitive Status: Within Functional Limits for tasks assessed                                        General Comments General comments (skin integrity, edema, etc.): Pt on 2L O2 via [place] on entry with SaO2 98%O2, able to maintain SaO2 >93%O2 with ambulation, with sitting in recliner able to maintain SaO2 >90% on RA     Assessment/Plan    PT Assessment Patient needs continued PT services  PT Problem List Decreased strength;Decreased activity tolerance;Decreased balance;Decreased mobility       PT Treatment Interventions DME instruction;Gait training;Functional mobility training;Therapeutic activities;Therapeutic exercise;Balance training;Cognitive remediation;Patient/family education    PT Goals (Current goals can be found in the Care Plan section)  Acute Rehab PT Goals 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 PT Goal Formulation: With patient Time For Goal Achievement: 08/07/20 Potential to Achieve Goals: Fair    Frequency Min 3X/week        Co-evaluation PT/OT/SLP Co-Evaluation/Treatment: Yes Reason for Co-Treatment: Complexity of the patient's impairments (multi-system involvement) PT goals addressed during session: Mobility/safety with mobility OT goals addressed during session: ADL's and self-care       AM-PAC PT "6 Clicks" Mobility  Outcome Measure Help needed turning from your back to  your side while in a flat bed without using bedrails?: None Help needed moving from lying on your back to sitting on the side of a flat bed without using bedrails?: A Little Help needed moving to and from a bed to a chair (including a wheelchair)?: A Little Help needed standing up from a chair using your arms (e.g., wheelchair or bedside chair)?: A Little Help needed to walk in hospital room?: A Little Help needed climbing 3-5 steps with a railing? : A Lot 6 Click Score: 18    End of Session Equipment Utilized During Treatment: Gait belt;Oxygen Activity Tolerance: Patient tolerated treatment well Patient left: in chair;with call bell/phone within reach;with chair alarm set;Other (comment) (Nephrologist in room) Nurse Communication: Mobility status PT Visit Diagnosis: Unsteadiness on feet (R26.81);Other abnormalities of gait and mobility (R26.89);Muscle weakness (generalized) (M62.81);History of falling (Z91.81);Difficulty in walking, not elsewhere classified (R26.2)    Time: 4765-4650 PT Time Calculation (min) (ACUTE ONLY): 25 min   Charges:   PT Evaluation $PT Eval Moderate Complexity: 1 Mod          Axavier Pressley B. Migdalia Dk PT, DPT Acute Rehabilitation Services Pager [phone] Office [phone]   [place] 07/24/2020, 2:06 PM

## 2020-07-24 NOTE — Progress Notes (Signed)
[facility] Progress Note   Assessment/ Plan:   OP HD: MWF [facility] (COVID) after DC   4h  77kg  P4  Hep none  2/2.5 bath  TDC/ LUA AVF   Calc 1.75   mircera 75 q 2 venofer 100   Midodrine ?   Assessment/ Plan: 1. COVID-treatment per primary.  Steroids/ remdesivir 2. ESRD:MWF HD.  On Lokelma and HD, s/p HD today 12/20.  Pre HD K was 5.7 3. HD access: has TDC and AVF. AVF has been used but she has been infiltrated multiple times and the plan per [facility] was AVG. This can be accomplished as an OP.   3. Hypotension/ volume  - was on midodrine 10 tid at home, continues here. 2-3 kg under dry wt. BP's low to normal, edema resolved. 5-6 L removed w/ HD here.  Will likely need new EDW upon discharge. 4. Anemia of ESRD:hgb over 10- No ESA needed at present.  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 COPD/ OSA 7. Afib on eliquis 8. Gout 9. DM2   Subjective:    Seen in room after dialysis this AM.  Got about 900 mL off.  Just finished working with OT.  No complaints.     Objective:   BP (!) 116/47   Pulse 65   Temp 98.3 F (36.8 C) (Oral)   Resp 13   Ht 4\' 8"  (1.422 m)   Wt 73.1 kg   LMP  (LMP Unknown)   SpO2 97%   BMI 36.13 kg/m   Physical Exam: Gen: sitting in chair, NAD CVS: RRR Resp: nonlabored Abd: obese Ext: no LE edema ACCESS: R IJ TDC and L AVF, nonfunctional  Labs: BMET Recent Labs  Lab 07/19/20 2254 07/20/20 1530 07/21/20 0850 07/22/20 0403 07/23/20 0452 07/24/20 0342  NA 141 137 138 138 135 139  K 6.8* >7.5* 4.4 5.8* 5.3* 5.7*  CL 102 96* 96* 97* 96* 100  CO2 18* 20* 26 28 27 23   GLUCOSE 76 202* 83 167* 183* 190*  BUN 57* 56* 20 34* 25* 49*  CREATININE 12.99* 13.51* 7.15* 8.50* 5.77* 7.52*  CALCIUM 6.3* 5.9* 7.4* 7.1* 7.5* 7.6*  PHOS  --  7.8*  --   --   --   --    CBC Recent Labs  Lab 07/21/20 0850 07/22/20 0403 07/23/20 0452 07/24/20 0342  WBC 4.7 4.6 4.9 6.2  NEUTROABS 3.4 3.8 4.3 5.8  HGB 11.9* 12.3 12.5 12.6  HCT 37.5  37.1 39.6 40.2  MCV 91.9 92.3 93.4 93.1  PLT 145* 171 191 205      Medications:    . allopurinol  100 mg Oral BID  . amiodarone  200 mg Oral Daily  . apixaban  2.5 mg Oral BID  . vitamin C  500 mg Oral Daily  . aspirin EC  81 mg Oral Daily  . atorvastatin  40 mg Oral QHS  . Chlorhexidine Gluconate Cloth  6 each Topical Daily  . ferric citrate  210 mg Oral BID WC  . fluticasone furoate-vilanterol  1 puff Inhalation Daily  . gabapentin  100 mg Oral QHS  . insulin aspart  0-6 Units Subcutaneous TID WC  . methylPREDNISolone (SOLU-MEDROL) injection  60 mg Intravenous Q12H  . midodrine  10 mg Oral TID WC  . pantoprazole  40 mg Oral Daily  . sodium zirconium cyclosilicate  10 g Oral Daily  . umeclidinium bromide  1 puff Inhalation Daily  . zinc sulfate  220 mg Oral Daily     Madelon Lips, MD 07/24/2020, 2:01 PM

## 2020-07-24 NOTE — Plan of Care (Signed)
  Problem: Respiratory: Goal: Will maintain a patent airway Outcome: Progressing   Problem: Respiratory: Goal: Complications related to the disease process, condition or treatment will be avoided or minimized Outcome: Progressing   

## 2020-07-24 NOTE — Progress Notes (Signed)
Occupational Therapy Evaluation  PTA pt lives with her son who works part time and drives pt to dialysis. Pt overall min A with mobility and ADL tasks @ RW on 2L with SpO2 remaining @ 96. O2 removed after activity with SpO2 remaining @ 97 on RA. Will follow acutely to facilitate safe DC home with HHOT. Pt states she will have family available to assist after DC.     07/24/20 1500  OT Visit Information  Assistance Needed +1  PT/OT/SLP Co-Evaluation/Treatment Yes  Reason for Co-Treatment For patient/therapist safety;To address functional/ADL transfers  OT goals addressed during session ADL's and self-care  History of Present Illness 74 year old female s/p SOB x1 week with chest discomfort found to have acute hyperkalemia with missed dailysis, COPDe and CHFe. PMHx: ESRD (MWF schedule), CAD, obstructive sleep apnea (on BIPAP QHS), HLD, GERD, DMT2, obesity, COPD, AFib. Present to ER 07/20/20 with hypoxia hypoxic requiring 4 L oxygen with chest x-ray showing bilateral infiltrates Covid test was positive. Admitted for treatment of COVID PNA and fluid overload.  Precautions  Precautions Fall  Home Living  Family/patient expects to be discharged to: Private residence  Living Arrangements Children  Available Help at Discharge Family;Available 24 hours/day  Type of Home House  Home Access Level entry  Home Layout One level  Bathroom Shower/Tub Tub/shower unit  Corporate treasurer Yes  How Accessible Accessible via walker  [place] - 2 wheels;Walker - 4 wheels  Additional Comments uses 2L O2 when needed  Prior Function  Level of Independence Needs assistance  Gait / Transfers Assistance Needed uses RW for mobility   ADL's / Homemaking Assistance Needed takes bird baths independently, assists with iADLs in the home  Comments reports a few falls, son takes her to dialysis, however she still drives  Communication  Communication Other (comment) (decreased  response to questions)  Pain Assessment  Pain Assessment Faces  Faces Pain Scale 2  Pain Location generalized with movement  Pain Descriptors / Indicators Grimacing  Pain Intervention(s) Limited activity within patient's tolerance  Cognition  Arousal/Alertness Awake/alert  Behavior During Therapy Flat affect (less than enthusiastic to work with therapy)  Overall Cognitive Status Within Functional Limits for tasks assessed  General Comments appears at baseline  Upper Extremity Assessment  Upper Extremity Assessment Generalized weakness  Lower Extremity Assessment  Lower Extremity Assessment Defer to PT evaluation  Cervical / Trunk Assessment  Cervical / Trunk Assessment Kyphotic  ADL  Overall ADL's  Needs assistance/impaired  Grooming Set up;Sitting  Upper Body Bathing Set up;Sitting  Lower Body Bathing Min guard;Sit to/from stand  Upper Body Dressing  Set up;Sitting  Lower Body Dressing Min guard;Sit to/from Retail buyer Minimal assistance;Ambulation  Toileting- Clothing Manipulation and Hygiene Min guard  Functional mobility during ADLs Minimal assistance;Cueing for safety  Bed Mobility  Overal bed mobility Needs Assistance  Bed Mobility Supine to Sit  Supine to sit Min guard;HOB elevated  General bed mobility comments min guard for safety, increased time, effort and use of bed rail to come to sitting EoB  Transfers  Overall transfer level Needs assistance  Equipment used Rolling walker (2 wheeled)  Transfers Sit to/from Stand  Sit to Stand Min guard  General transfer comment min guard for safety with power up and steadying at RW  Balance  Overall balance assessment Needs assistance  Sitting-balance support Feet supported;No upper extremity supported  Sitting balance-Leahy Scale Fair  Standing balance support During functional activity;No upper extremity supported;Bilateral upper  extremity supported;Single extremity supported  Standing balance-Leahy Scale Poor   Standing balance comment needs some support, UE or lean on sink to maintain balance  General Comments  General comments (skin integrity, edema, etc.) VSS on 2L; 2L removed while in chair with SpO2 @ 97  OT - End of Session  Equipment Utilized During Treatment Gait belt;Rolling walker;Oxygen (2L)  Activity Tolerance Patient tolerated treatment well  Patient left in chair;with call bell/phone within reach;with chair alarm set  Nurse Communication Mobility status  OT Assessment  OT Recommendation/Assessment Patient needs continued OT Services  OT Visit Diagnosis Unsteadiness on feet (R26.81);Other abnormalities of gait and mobility (R26.89);Muscle weakness (generalized) (M62.81);Pain  Pain - part of body  (generalized)  OT Problem List Decreased strength;Decreased activity tolerance;Decreased safety awareness;Decreased knowledge of use of DME or AE;Cardiopulmonary status limiting activity;Obesity;Pain  OT Plan  OT Frequency (ACUTE ONLY) Min 2X/week  OT Treatment/Interventions (ACUTE ONLY) Self-care/ADL training;Therapeutic exercise;Energy conservation;DME and/or AE instruction;Therapeutic activities;Patient/family education;Balance training  AM-PAC OT "6 Clicks" Daily Activity Outcome Measure (Version 2)  Help from another person eating meals? 4  Help from another person taking care of personal grooming? 3  Help from another person toileting, which includes using toliet, bedpan, or urinal? 3  Help from another person bathing (including washing, rinsing, drying)? 3  Help from another person to put on and taking off regular upper body clothing? 3  Help from another person to put on and taking off regular lower body clothing? 3  6 Click Score 19  OT Recommendation  Follow Up Recommendations Home health OT;Supervision - Intermittent  OT Equipment None recommended by OT  Individuals Consulted  Consulted and Agree with Results and Recommendations Patient  Acute Rehab OT Goals  Patient Stated  Goal get back to painting  OT Goal Formulation With patient  Time For Goal Achievement 08/07/20  Potential to Achieve Goals Good  OT Time Calculation  OT Start Time (ACUTE ONLY) 1301  OT Stop Time (ACUTE ONLY) 1326  OT Time Calculation (min) 25 min  OT General Charges  $OT Visit 1 Visit  OT Evaluation  $OT Eval Moderate Complexity 1 Mod  Written Expression  Dominant Hand Right  Maurie Boettcher, OT/L   Acute OT Clinical Specialist Acute Rehabilitation Services Pager [phone] Office [phone]

## 2020-07-24 NOTE — Progress Notes (Signed)
OT Cancellation Note  Patient Details Name: [name] MRN: [id number] DOB: [birth year]   Cancelled Treatment:    Reason Eval/Treat Not Completed: Patient at procedure or test/ unavailable (HD)  Shaquan Puerta,HILLARY 07/24/2020, 8:12 AM  Maurie Boettcher, OT/L   Acute OT Clinical Specialist [place] Pager [phone] Office [phone]

## 2020-07-25 LAB — GLUCOSE, CAPILLARY
Glucose-Capillary: 210 mg/dL — ABNORMAL HIGH (ref 70–99)
Glucose-Capillary: 231 mg/dL — ABNORMAL HIGH (ref 70–99)
Glucose-Capillary: 224 mg/dL — ABNORMAL HIGH (ref 70–99)
Glucose-Capillary: 185 mg/dL — ABNORMAL HIGH (ref 70–99)

## 2020-07-25 LAB — COMPREHENSIVE METABOLIC PANEL
ALT: 7 U/L (ref 0–44)
AST: 12 U/L — ABNORMAL LOW (ref 15–41)
Albumin: 3 g/dL — ABNORMAL LOW (ref 3.5–5.0)
Alkaline Phosphatase: 56 U/L (ref 38–126)
Anion gap: 14 (ref 5–15)
BUN: 35 mg/dL — ABNORMAL HIGH (ref 8–23)
CO2: 28 mmol/L (ref 22–32)
Calcium: 8.2 mg/dL — ABNORMAL LOW (ref 8.9–10.3)
Chloride: 96 mmol/L — ABNORMAL LOW (ref 98–111)
Creatinine, Ser: 5.29 mg/dL — ABNORMAL HIGH (ref 0.44–1.00)
GFR, Estimated: 8 mL/min — ABNORMAL LOW (ref >60)
Glucose, Bld: 220 mg/dL — ABNORMAL HIGH (ref 70–99)
Potassium: 4.6 mmol/L (ref 3.5–5.1)
Sodium: 138 mmol/L (ref 135–145)
Total Bilirubin: 0.5 mg/dL (ref 0.3–1.2)
Total Protein: 6 g/dL — ABNORMAL LOW (ref 6.5–8.1)

## 2020-07-25 LAB — CBC WITH DIFFERENTIAL/PLATELET
Abs Immature Granulocytes: 0.03 10*3/uL (ref 0.00–0.07)
Basophils Absolute: 0 10*3/uL (ref 0.0–0.1)
Basophils Relative: 0 %
Eosinophils Absolute: 0 10*3/uL (ref 0.0–0.5)
Eosinophils Relative: 0 %
HCT: 38.5 % (ref 36.0–46.0)
Hemoglobin: 12.8 g/dL (ref 12.0–15.0)
Immature Granulocytes: 0 %
Lymphocytes Relative: 5 %
Lymphs Abs: 0.3 10*3/uL — ABNORMAL LOW (ref 0.7–4.0)
MCH: 30.5 pg (ref 26.0–34.0)
MCHC: 33.2 g/dL (ref 30.0–36.0)
MCV: 91.7 fL (ref 80.0–100.0)
Monocytes Absolute: 0.1 10*3/uL (ref 0.1–1.0)
Monocytes Relative: 1 %
Neutro Abs: 6.5 10*3/uL (ref 1.7–7.7)
Neutrophils Relative %: 94 %
Platelets: 187 10*3/uL (ref 150–400)
RBC: 4.2 MIL/uL (ref 3.87–5.11)
RDW: 14.4 % (ref 11.5–15.5)
WBC: 7 10*3/uL (ref 4.0–10.5)
nRBC: 0 % (ref 0.0–0.2)

## 2020-07-25 LAB — C-REACTIVE PROTEIN: CRP: 0.8 mg/dL (ref <1.0)

## 2020-07-25 LAB — D-DIMER, QUANTITATIVE: D-Dimer, Quant: 0.5 ug/mL-FEU (ref 0.00–0.50)

## 2020-07-25 MED ORDER — GUAIFENESIN ER 600 MG PO TB12
600.0000 mg | ORAL_TABLET | Freq: Two times a day (BID) | ORAL | Status: DC
  Administered 2020-07-25 – 2020-07-27 (×5): 600 mg via ORAL
  Filled 2020-07-25 (×5): qty 1

## 2020-07-25 MED ORDER — METHYLPREDNISOLONE SODIUM SUCC 40 MG IJ SOLR
40.0000 mg | Freq: Every day | INTRAMUSCULAR | Status: DC
  Administered 2020-07-26 – 2020-07-27 (×2): 40 mg via INTRAVENOUS
  Filled 2020-07-25 (×2): qty 1

## 2020-07-25 NOTE — Progress Notes (Signed)
Inpatient Diabetes Program Recommendations  AACE/ADA: New Consensus Statement on Inpatient Glycemic Control   Target Ranges:  Prepandial:   less than 140 mg/dL      Peak postprandial:   less than 180 mg/dL (1-2 hours)      Critically ill patients:  140 - 180 mg/dL   Results for LANAIYA, LANTRY (MRN [id number]) as of 07/25/2020 11:22  Ref. Range 07/24/2020 07:23 07/24/2020 12:26 07/24/2020 17:02 07/24/2020 21:04 07/25/2020 07:40  Glucose-Capillary Latest Ref Range: 70 - 99 mg/dL 165 (H) 119 (H) 158 (H) 268 (H) 210 (H)   Review of Glycemic Control  Diabetes history: DM2 Outpatient Diabetes medications: Lantus 10 units daily if glucose >120 mg/dl, Humalog 5-16 units TID with meals if glucose >120 mg/dl Current orders for Inpatient glycemic control: Novolog 0-6 units TID with meals; Solumedrol 60 mg Q12H  Inpatient Diabetes Program Recommendations:    Insulin: Please consider ordering Novolog 0-5 units QHS for bedtime correction. If steroids are continued and glucose remains consistently over 180 mg/dl, may want to consider ordering Lantus 5 units Q24H.  Thanks, Barnie Alderman, RN, MSN, CDE Diabetes Coordinator Inpatient Diabetes Program [phone] (Team Pager from 8am to 5pm)

## 2020-07-25 NOTE — Care Management Important Message (Signed)
Important Message  Patient Details  Name: [name] MRN: [id number] Date of Birth: 10/02/1945   Medicare Important Message Given:  Yes - Important Message mailed due to current National Emergency  Verbal consent obtained due to current National Emergency  Relationship to patient: Self Contact Name: Jaiya Mooradian Call Date: 07/25/20  Time: 1431 Phone: [phone] Outcome: Spoke with contact Important Message mailed to: Patient address on file    Delorse Lek 07/25/2020, 2:32 PM

## 2020-07-25 NOTE — Progress Notes (Signed)
[facility]                                                                              Patient Demographics  [name], is a 74 y.o. female, DOB - 06/29/1946, RJJ:[id number]  Admit date - 07/19/2020   Admitting Physician [name], MD  Outpatient Primary MD for the patient is [name], MD  Outpatient specialists:   LOS - 5  days   Medical records reviewed and are as summarized below:    Chief Complaint  Patient presents with  . Cough       Brief summary   Patient is a 74 year old female with history of ESRD on HD, MWF, DM type II, paroxysmal A. fib, gout had been experiencing worsening shortness of breath with productive cough generalized weakness and vomiting and diarrhea for the last week.  Patient has missed her dialysis almost for last 2 sessions because patient states there was some change in the schedule and when she went last Monday about 3 days ago she had a long wait time and had to go back home.  Patient reported that her family members at home has been diagnosed with Covid infection.  Patient has been having productive cough, and diarrhea.  Denies chest pain. In ED, was hypoxic requiring 4 L O2.  Chest x-ray showed bilateral infiltrates.  Covid test positive. Labs significant for hyperkalemia, EKG with peaked T waves, received Lokelma and nephrology was consulted. In ED, patient was hypoglycemic, was placed on D10.  Admitted for acute respiratory failure with hypoxia due to Covid infection, fluid overload due to missed HD.   Subjective:   [name] was seen and examined today.  She still reports some cough, congestion, reports generalized weakness and fatigue.    Report   Assessment & Plan    Acute hypoxic respiratory failure due to acute COVID-19 viral pneumonia during the ongoing COVID-19 pandemic- POA -He is vaccinated with [place] this March and April, but no booster yet. - Patient presented with productive  cough, generalized weakness, nausea vomiting diarrhea, shortness of breath for a week.  Chest x-ray showed bilateral infiltrates.  Likely has combination of fluid overload due to missed hemodialysis x2 and COVID-19 - Improving with HD  ,  - cont IV steroids, 40 mg of IV Solu-Medrol daily. - she finished her remdesivir remdesevir  - Continue Supportive care: vitamin C/zinc, albuterol, Tylenol. -Reports some congestion, she was encouraged use incentive spirometry, flutter valve, and out of bed to chair. - Continue to wean oxygen, ambulatory O2 screening daily as tolerated  - Oxygen - SpO2: (!) 93 % O2 Flow Rate (L/min): 2 L/min - Continue to follow labs as below  Lab Results  Component Value Date   SARSCOV2NAA POSITIVE (A) 07/19/2020   [place] NEGATIVE 04/26/2020   [place] NEGATIVE 04/24/2020   [place] NEGATIVE 03/06/2020     Recent Labs  Lab 07/20/20 0653 07/20/20 1530 07/21/20 0850 07/22/20 0403 07/23/20 0452 07/24/20 0342 07/25/20 0359  DDIMER 0.83*  --  0.59* 0.46 0.44 0.54* 0.50  CRP 1.0*  --  3.6* 2.8* 1.4* 1.2* 0.8  ALT  --    < >  10 8 9 7 7   PROCALCITON 0.24  --   --   --   --   --   --    < > = values in this interval not displayed.         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  -per renal: will need to dialyze in isolation for 21 days from positive Covid test on third shift MWF at [facility] upon discharge.  Paroxysmal atrial fibrillation -Rate currently controlled, continue amiodarone - continue Eliquis  GERD -Continue  PPI    Chronic obstructive pulmonary disease (HCC) -Continue IV steroids, Incruse Ellipta, Ventolin inhaler -No acute wheezing  Pressure injury, POA -Sacrum with stage II, wound care per nursing  Obesity Estimated body mass index is 36.28 kg/m as calculated from the following:   Height as of this encounter: 4\' 8"  (1.422 m).   Weight as of this encounter: 73.4 kg.  - will need lifestyle changes and diet control  Code Status: Full CODE STATUS DVT Prophylaxis: Eliquis Family communication: Discussed with son by phone 07/24/2020  Disposition Plan:     Status is: Inpatient  Remains inpatient appropriate because:Inpatient level of care appropriate due to severity of illness   Dispo: The patient is from: Home              Anticipated d/c is to: Home              Anticipated d/c date is: 1 day              Patient currently is not medically stable to d/c.  Hypoxia, active Covid infection,      Procedures:  Hemodialysis  Consultants:   *Nephrology  Antimicrobials:   Anti-infectives (From admission, onward)   Start     Dose/Rate Route Frequency Ordered Stop   07/21/20 1000  remdesivir 100 mg in sodium chloride 0.9 % 100 mL IVPB       "Followed by" Linked Group Details   100 mg 200 mL/hr over 30 Minutes Intravenous Daily 07/20/20 0142 07/24/20 1600   07/21/20 1000  remdesivir 100 mg in sodium chloride 0.9 % 100 mL IVPB  Status:  Discontinued       "Followed by" Linked Group Details   100 mg 200 mL/hr over 30 Minutes Intravenous Daily 07/20/20 0546 07/20/20 0551   07/20/20 0600  remdesivir 200 mg in sodium chloride 0.9% 250 mL IVPB  Status:  Discontinued       "Followed by" Linked Group Details   200 mg 580 mL/hr over 30 Minutes Intravenous Once 07/20/20 0546 07/20/20 0551   07/20/20 0230  remdesivir 200 mg in sodium chloride 0.9% 250 mL IVPB       "Followed by" Linked Group Details   200 mg 580 mL/hr over 30 Minutes Intravenous Once 07/20/20 0142 07/20/20 0402          Medications  Scheduled Meds: . allopurinol  100 mg Oral BID  . amiodarone  200 mg Oral Daily  . apixaban  2.5 mg Oral BID  . vitamin C  500 mg Oral Daily  .  aspirin EC  81 mg Oral Daily  . atorvastatin  40 mg Oral QHS  . Chlorhexidine Gluconate Cloth  6 each Topical Daily  . ferric citrate  210 mg Oral BID WC  . fluticasone furoate-vilanterol  1 puff Inhalation Daily  . gabapentin  100 mg Oral QHS  . insulin aspart  0-6 Units Subcutaneous TID WC  . methylPREDNISolone (SOLU-MEDROL) injection  60 mg Intravenous Q12H  . midodrine  10 mg Oral TID WC  . pantoprazole  40 mg Oral Daily  . umeclidinium bromide  1 puff Inhalation Daily  . zinc sulfate  220 mg Oral Daily   Continuous Infusions:  PRN Meds:.albuterol, chlorpheniramine-HYDROcodone, guaiFENesin-dextromethorphan, nitroGLYCERIN, ondansetron (ZOFRAN) IV, sodium chloride flush, traMADol       Objective:   Vitals:   07/25/20 0004 07/25/20 0400 07/25/20 0734 07/25/20 1217  BP: (!) 112/34 (!) 124/52 (!) 113/45 (!) 120/42  Pulse: (!) 48 (!) 58 (!) 50 (!) 52  Resp: 18 18 18 18   Temp: 98.1 F (36.7 C) 98 F (36.7 C) 98.1 F (36.7 C) 99 F (37.2 C)  TempSrc: Oral Oral Oral Oral  SpO2: 93% 94% 96% 98%  Weight:  73.4 kg    Height:        Intake/Output Summary (Last 24 hours) at 07/25/2020 1432 Last data filed at 07/25/2020 0919 Gross per 24 hour  Intake 480 ml  Output --  Net 480 ml     Wt Readings from Last 3 Encounters:  07/25/20 73.4 kg  04/28/20 78.3 kg  04/07/20 75.3 kg     Physical Exam  Awake Alert, Oriented X 3, No new F.N deficits, Normal affect Symmetrical Chest wall movement, Good air movement bilaterally, CTAB RRR,No Gallops,Rubs or new Murmurs, No Parasternal Heave +ve B.Sounds, Abd Soft, No tenderness, No rebound - guarding or rigidity. No Cyanosis, Clubbing or edema, No new Rash or bruise        Data Reviewed:  I have personally reviewed following labs and imaging  studies  Micro Results Recent Results (from the past 240 hour(s))  Resp Panel by RT-PCR (Flu A&B, Covid) Nasopharyngeal Swab     Status: Abnormal   Collection Time: 07/19/20 11:02 PM   Specimen: Nasopharyngeal Swab; Nasopharyngeal(NP) swabs in vial transport medium  Result Value Ref Range Status   SARS Coronavirus 2 by RT PCR POSITIVE (A) NEGATIVE Final    Comment: RESULT CALLED TO, READ BACK BY AND VERIFIED WITH: C CRISCO RN 07/20/20 0047 (NOTE) SARS-CoV-2 target nucleic acids are DETECTED.  The SARS-CoV-2 RNA is generally detectable in upper respiratory specimens during the acute phase of infection. Positive results are indicative of the presence of the identified virus, but do not rule out bacterial infection or co-infection with other pathogens not detected by the test. Clinical correlation with patient history and other diagnostic information is necessary to determine patient infection status. The expected result is Negative.  Fact Sheet for Patients: EntrepreneurPulse.com.au  Fact Sheet for Healthcare Providers: IncredibleEmployment.be  This test is not yet approved or cleared by the [place] FDA and  has been authorized for detection and/or diagnosis of SARS-CoV-2 by FDA under an Emergency Use Authorization (EUA).  This EUA will remain in effect (meaning this test can be used) f or the duration of  the COVID-19 declaration under Section 564(b)(1) of the Act, 21 U.S.C. section 360bbb-3(b)(1), unless the authorization is terminated or revoked sooner.     Influenza A by PCR NEGATIVE NEGATIVE Final   Influenza B by PCR  NEGATIVE NEGATIVE Final    Comment: (NOTE) The Xpert Xpress SARS-CoV-2/FLU/RSV plus assay is intended as an aid in the diagnosis of influenza from Nasopharyngeal swab specimens and should not be used as a sole basis for treatment. Nasal washings and aspirates are unacceptable for Xpert Xpress  SARS-CoV-2/FLU/RSV testing.  Fact Sheet for Patients: EntrepreneurPulse.com.au  Fact Sheet for Healthcare Providers: IncredibleEmployment.be  This test is not yet approved or cleared by the [place] FDA and has been authorized for detection and/or diagnosis of SARS-CoV-2 by FDA under an Emergency Use Authorization (EUA). This EUA will remain in effect (meaning this test can be used) for the duration of the COVID-19 declaration under Section 564(b)(1) of the Act, 21 U.S.C. section 360bbb-3(b)(1), unless the authorization is terminated or revoked.  Performed at [facility], [place] [address], [place], [place] [address]     Radiology Reports DG Chest Portable 1 View  Result Date: 07/19/2020 CLINICAL DATA:  Cough for 2 weeks.  Possible COVID. EXAM: PORTABLE CHEST 1 VIEW COMPARISON:  Radiograph 04/24/2020, CT 07/17/2006 FINDINGS: Left-sided dialysis catheter and right chest port remain in place. Lower lung volumes from prior exam. Stable heart size and mediastinal contours. Right pericardiac density corresponds to hiatal hernia on prior CT. Minimal patchy opacity at both lung bases. No significant pleural effusion. No pneumothorax. Vascular stent in the left upper arm. IMPRESSION: Minimal patchy opacity at both lung bases, may represent atelectasis or pneumonia. Electronically Signed   By: Keith Rake M.D.   On: 07/19/2020 23:28    Lab Data:  CBC: Recent Labs  Lab 07/21/20 0850 07/22/20 0403 07/23/20 0452 07/24/20 0342 07/25/20 0359  WBC 4.7 4.6 4.9 6.2 7.0  NEUTROABS 3.4 3.8 4.3 5.8 6.5  HGB 11.9* 12.3 12.5 12.6 12.8  HCT 37.5 37.1 39.6 40.2 38.5  MCV 91.9 92.3 93.4 93.1 91.7  PLT 145* 171 191 205 017   Basic Metabolic Panel: Recent Labs  Lab 07/20/20 1530 07/21/20 0850 07/22/20 0403 07/23/20 0452 07/24/20 0342 07/25/20 0359  NA 137 138 138 135 139 138  K >7.5* 4.4 5.8* 5.3* 5.7* 4.6  CL 96* 96* 97* 96* 100 96*   CO2 20* 26 28 27 23 28   GLUCOSE 202* 83 167* 183* 190* 220*  BUN 56* 20 34* 25* 49* 35*  CREATININE 13.51* 7.15* 8.50* 5.77* 7.52* 5.29*  CALCIUM 5.9* 7.4* 7.1* 7.5* 7.6* 8.2*  PHOS 7.8*  --   --   --   --   --    GFR: Estimated Creatinine Clearance: 7.4 mL/min (A) (by C-G formula based on SCr of 5.29 mg/dL (H)). Liver Function Tests: Recent Labs  Lab 07/21/20 0850 07/22/20 0403 07/23/20 0452 07/24/20 0342 07/25/20 0359  AST 16 13* 13* 11* 12*  ALT 10 8 9 7 7   ALKPHOS 54 50 48 48 56  BILITOT 0.8 0.6 0.4 0.7 0.5  PROT 6.2* 5.9* 6.1* 5.9* 6.0*  ALBUMIN 3.1* 2.9* 3.0* 3.0* 3.0*   No results for input(s): LIPASE, AMYLASE in the last 168 hours. No results for input(s): AMMONIA in the last 168 hours. Coagulation Profile: No results for input(s): INR, PROTIME in the last 168 hours. Cardiac Enzymes: No results for input(s): CKTOTAL, CKMB, CKMBINDEX, TROPONINI in the last 168 hours. BNP (last 3 results) No results for input(s): PROBNP in the last 8760 hours. HbA1C: No results for input(s): HGBA1C in the last 72 hours. CBG: Recent Labs  Lab 07/24/20 1226 07/24/20 1702 07/24/20 2104 07/25/20 0740 07/25/20 [place]  119* 158* 268* 210* 231*   Lipid Profile: No results for input(s): CHOL, HDL, LDLCALC, TRIG, CHOLHDL, LDLDIRECT in the last 72 hours. Thyroid Function Tests: No results for input(s): TSH, T4TOTAL, FREET4, T3FREE, THYROIDAB in the last 72 hours. Anemia Panel: No results for input(s): VITAMINB12, FOLATE, FERRITIN, TIBC, IRON, RETICCTPCT in the last 72 hours. Urine analysis:    Component Value Date/Time   COLORURINE YELLOW 04/24/2020 1528   APPEARANCEUR HAZY (A) 04/24/2020 1528   LABSPEC 1.013 04/24/2020 1528   PHURINE 7.0 04/24/2020 1528   GLUCOSEU NEGATIVE 04/24/2020 1528   HGBUR NEGATIVE 04/24/2020 1528   BILIRUBINUR NEGATIVE 04/24/2020 1528   KETONESUR NEGATIVE 04/24/2020 1528   PROTEINUR 100 (A) 04/24/2020 1528   UROBILINOGEN 0.2 01/29/2007 1500    NITRITE NEGATIVE 04/24/2020 [place] 04/24/2020 [place] M.D. [facility] 07/25/2020, 2:32 PM   Call night coverage person covering after 7pm

## 2020-07-25 NOTE — Progress Notes (Signed)
[facility] Progress Note   Assessment/ Plan:   OP HD: MWF [facility] (COVID) after DC   4h  77kg  P4  Hep none  2/2.5 bath  TDC/ LUA AVF   Calc 1.75   mircera 75 q 2 venofer 100   Midodrine ?   Assessment/ Plan: 1. COVID-treatment per primary.  Steroids/ remdesivir 2. ESRD:MWF HD.  s/p HD today 12/20.  Pre HD K was 5.7, today 4.6.  Stopped lokelma 3. HD access: has TDC and AVF. AVF has been used but she has been infiltrated multiple times and the plan per [facility] was AVG. This can be accomplished as an OP.   3. Hypotension/ volume  - was on midodrine 10 tid at home, continues here. 2-3 kg under dry wt. BP's low to normal, edema resolved. 5-6 L removed w/ HD here.  Will likely need new EDW upon discharge. 4. Anemia of ESRD:hgb over 10- No ESA needed at present.  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 COPD/ OSA 7. Afib on eliquis 8. Gout 9. DM2   Subjective:    Seen in room.  No issues.  Discussed quarantine.      Objective:   BP (!) 120/42 (BP Location: Right Arm)   Pulse (!) 52   Temp 99 F (37.2 C) (Oral)   Resp 18   Ht 4\' 8"  (1.422 m)   Wt 73.4 kg   LMP  (LMP Unknown)   SpO2 98%   BMI 36.28 kg/m   Physical Exam: Gen: sitting in chair, NAD CVS: RRR Resp: nonlabored Abd: obese Ext: no LE edema ACCESS: L IJ TDC and L AVF, nonfunctional  Labs: BMET Recent Labs  Lab 07/19/20 2254 07/20/20 1530 07/21/20 0850 07/22/20 0403 07/23/20 0452 07/24/20 0342 07/25/20 0359  NA 141 137 138 138 135 139 138  K 6.8* >7.5* 4.4 5.8* 5.3* 5.7* 4.6  CL 102 96* 96* 97* 96* 100 96*  CO2 18* 20* 26 28 27 23 28   GLUCOSE 76 202* 83 167* 183* 190* 220*  BUN 57* 56* 20 34* 25* 49* 35*  CREATININE 12.99* 13.51* 7.15* 8.50* 5.77* 7.52* 5.29*  CALCIUM 6.3* 5.9* 7.4* 7.1* 7.5* 7.6* 8.2*  PHOS  --  7.8*  --   --   --   --   --    CBC Recent Labs  Lab 07/22/20 0403 07/23/20 0452 07/24/20 0342 07/25/20 0359  WBC 4.6 4.9 6.2 7.0  NEUTROABS 3.8 4.3 5.8 6.5  HGB  12.3 12.5 12.6 12.8  HCT 37.1 39.6 40.2 38.5  MCV 92.3 93.4 93.1 91.7  PLT 171 191 205 187      Medications:    . allopurinol  100 mg Oral BID  . amiodarone  200 mg Oral Daily  . apixaban  2.5 mg Oral BID  . vitamin C  500 mg Oral Daily  . aspirin EC  81 mg Oral Daily  . atorvastatin  40 mg Oral QHS  . Chlorhexidine Gluconate Cloth  6 each Topical Daily  . ferric citrate  210 mg Oral BID WC  . fluticasone furoate-vilanterol  1 puff Inhalation Daily  . gabapentin  100 mg Oral QHS  . insulin aspart  0-6 Units Subcutaneous TID WC  . methylPREDNISolone (SOLU-MEDROL) injection  60 mg Intravenous Q12H  . midodrine  10 mg Oral TID WC  . pantoprazole  40 mg Oral Daily  . sodium zirconium cyclosilicate  10 g Oral Daily  . umeclidinium bromide  1 puff  Inhalation Daily  . zinc sulfate  220 mg Oral Daily     Madelon Lips, MD 07/25/2020, 1:06 PM

## 2020-07-26 DIAGNOSIS — J1282 Pneumonia due to coronavirus disease 2019: Secondary | ICD-10-CM

## 2020-07-26 LAB — GLUCOSE, CAPILLARY
Glucose-Capillary: 151 mg/dL — ABNORMAL HIGH (ref 70–99)
Glucose-Capillary: 89 mg/dL (ref 70–99)
Glucose-Capillary: 155 mg/dL — ABNORMAL HIGH (ref 70–99)
Glucose-Capillary: 218 mg/dL — ABNORMAL HIGH (ref 70–99)

## 2020-07-26 MED ORDER — HEPARIN SODIUM (PORCINE) 1000 UNIT/ML IJ SOLN
INTRAMUSCULAR | Status: AC
  Filled 2020-07-26: qty 4

## 2020-07-26 NOTE — Progress Notes (Signed)
[facility]                                                                              Patient Demographics  [name], is a 74 y.o. female, DOB - 10-20-1945, EHO:[id number]  Admit date - 07/19/2020   Admitting Physician [name], MD  Outpatient Primary MD for the patient is [name], MD  Outpatient specialists:   LOS - 6  days   Medical records reviewed and are as summarized below:    Chief Complaint  Patient presents with  . Cough       Brief summary   Patient is a 74 year old female with history of ESRD on HD, MWF, DM type II, paroxysmal A. fib, gout had been experiencing worsening shortness of breath with productive cough generalized weakness and vomiting and diarrhea for the last week.  Patient has missed her dialysis almost for last 2 sessions because patient states there was some change in the schedule and when she went last Monday about 3 days ago she had a long wait time and had to go back home.  Patient reported that her family members at home has been diagnosed with Covid infection.  Patient has been having productive cough, and diarrhea.  Denies chest pain. In ED, was hypoxic requiring 4 L O2.  Chest x-ray showed bilateral infiltrates.  Covid test positive. Labs significant for hyperkalemia, EKG with peaked T waves, received Lokelma and nephrology was consulted. In ED, patient was hypoglycemic, was placed on D10.  Admitted for acute respiratory failure with hypoxia due to Covid infection, fluid overload due to missed HD.   Subjective:   [name] was seen and examined today.  Cough has significantly improved, congestion has improved as well.   Assessment & Plan    Acute hypoxic respiratory failure due to acute COVID-19 viral pneumonia during the ongoing COVID-19 pandemic- POA -He is vaccinated with [place] this March and April, but no booster yet. - Patient presented with productive cough, generalized weakness,  nausea vomiting diarrhea, shortness of breath for a week.  Chest x-ray showed bilateral infiltrates.  Likely has combination of fluid overload due to missed hemodialysis x2 and COVID-19 - Improving with HD  ,  - cont IV steroids, 40 mg of IV Solu-Medrol daily. - she finished her remdesivir remdesevir  - Continue Supportive care: vitamin C/zinc, albuterol, Tylenol. -Reports some congestion, she was encouraged use incentive spirometry, flutter valve, and out of bed to chair. - Continue to wean oxygen, ambulatory O2 screening daily as tolerated  - Oxygen - SpO2: (!) 93 % O2 Flow Rate (L/min): 2 L/min - Continue to follow labs as below  Lab Results  Component Value Date   SARSCOV2NAA POSITIVE (A) 07/19/2020   [place] NEGATIVE 04/26/2020   [employer] NEGATIVE 04/24/2020   [place] NEGATIVE 03/06/2020     Recent Labs  Lab 07/20/20 0653 07/20/20 1530 07/21/20 0850 07/22/20 0403 07/23/20 0452 07/24/20 0342 07/25/20 0359  DDIMER 0.83*  --  0.59* 0.46 0.44 0.54* 0.50  CRP 1.0*  --  3.6* 2.8* 1.4* 1.2* 0.8  ALT  --    < > 10 8  9 7 7   PROCALCITON 0.24  --   --   --   --   --   --    < > = values in this interval not displayed.         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  -per renal: will need to dialyze in isolation for 21 days from positive Covid test on third shift MWF at [facility] upon discharge.  Paroxysmal atrial fibrillation -Rate currently controlled, continue amiodarone - continue Eliquis  GERD -Continue PPI    Chronic  obstructive pulmonary disease (HCC) -Continue IV steroids, Incruse Ellipta, Ventolin inhaler -No acute wheezing  Pressure injury, POA -Sacrum with stage II, wound care per nursing  Obesity Estimated body mass index is 35.98 kg/m as calculated from the following:   Height as of this encounter: 4\' 8"  (1.422 m).   Weight as of this encounter: 72.8 kg.  - will need lifestyle changes and diet control  Code Status: Full CODE STATUS DVT Prophylaxis: Eliquis Family communication: Discussed with son by phone 07/24/2020, 07/26/2020  Disposition Plan:     Status is: Inpatient  Remains inpatient appropriate because:Inpatient level of care appropriate due to severity of illness   Dispo: The patient is from: Home              Anticipated d/c is to: Home              Anticipated d/c date is: 1 day              Patient currently is not medically stable to d/c.  Hypoxia, active Covid infection,on IV steroids      Procedures:  Hemodialysis  Consultants:   *Nephrology  Antimicrobials:   Anti-infectives (From admission, onward)   Start     Dose/Rate Route Frequency Ordered Stop   07/21/20 1000  remdesivir 100 mg in sodium chloride 0.9 % 100 mL IVPB       "Followed by" Linked Group Details   100 mg 200 mL/hr over 30 Minutes Intravenous Daily 07/20/20 0142 07/24/20 1600   07/21/20 1000  remdesivir 100 mg in sodium chloride 0.9 % 100 mL IVPB  Status:  Discontinued       "Followed by" Linked Group Details   100 mg 200 mL/hr over 30 Minutes Intravenous Daily 07/20/20 0546 07/20/20 0551   07/20/20 0600  remdesivir 200 mg in sodium chloride 0.9% 250 mL IVPB  Status:  Discontinued       "Followed by" Linked Group Details   200 mg 580 mL/hr over 30 Minutes Intravenous Once 07/20/20 0546 07/20/20 0551   07/20/20 0230  remdesivir 200 mg in sodium chloride 0.9% 250 mL IVPB       "Followed by" Linked Group Details   200 mg 580 mL/hr over 30 Minutes Intravenous Once 07/20/20 0142 07/20/20 0402          Medications  Scheduled Meds: . allopurinol  100 mg Oral BID  . amiodarone  200 mg Oral Daily  . apixaban  2.5 mg Oral BID  . vitamin C  500 mg  Oral Daily  . aspirin EC  81 mg Oral Daily  . atorvastatin  40 mg Oral QHS  . Chlorhexidine Gluconate Cloth  6 each Topical Daily  . ferric citrate  210 mg Oral BID WC  . fluticasone furoate-vilanterol  1 puff Inhalation Daily  . gabapentin  100 mg Oral QHS  . guaiFENesin  600 mg Oral BID  . heparin sodium (porcine)      . insulin aspart  0-6 Units Subcutaneous TID WC  . methylPREDNISolone (SOLU-MEDROL) injection  40 mg Intravenous Daily  . midodrine  10 mg Oral TID WC  . pantoprazole  40 mg Oral Daily  . umeclidinium bromide  1 puff Inhalation Daily  . zinc sulfate  220 mg Oral Daily   Continuous Infusions:  PRN Meds:.albuterol, chlorpheniramine-HYDROcodone, guaiFENesin-dextromethorphan, nitroGLYCERIN, ondansetron (ZOFRAN) IV, sodium chloride flush, traMADol       Objective:   Vitals:   07/26/20 1025 07/26/20 1030 07/26/20 1100 07/26/20 1130  BP: (!) 96/37 (!) 89/40 (!) 93/39 (!) 90/41  Pulse: (!) 55 (!) 55 62 60  Resp: 20 16 16 16   Temp:      TempSrc:      SpO2:      Weight:      Height:        Intake/Output Summary (Last 24 hours) at 07/26/2020 1214 Last data filed at 07/26/2020 1914 Gross per 24 hour  Intake 440 ml  Output --  Net 440 ml     Wt Readings from Last 3 Encounters:  07/26/20 72.8 kg  04/28/20 78.3 kg  04/07/20 75.3 kg     Physical Exam  Awake Alert, Oriented X 3, No new F.N deficits, Normal affect Symmetrical Chest wall movement, Good air movement bilaterally, CTAB RRR,No Gallops,Rubs or new Murmurs, No Parasternal Heave +ve B.Sounds, Abd Soft, No tenderness, No rebound - guarding or rigidity. No Cyanosis, Clubbing or edema, No new Rash or bruise        Data Reviewed:  I have personally reviewed following labs and imaging studies  Micro Results Recent Results (from the  past 240 hour(s))  Resp Panel by RT-PCR (Flu A&B, Covid) Nasopharyngeal Swab     Status: Abnormal   Collection Time: 07/19/20 11:02 PM   Specimen: Nasopharyngeal Swab; Nasopharyngeal(NP) swabs in vial transport medium  Result Value Ref Range Status   SARS Coronavirus 2 by RT PCR POSITIVE (A) NEGATIVE Final    Comment: RESULT CALLED TO, READ BACK BY AND VERIFIED WITH: C CRISCO RN 07/20/20 0047 (NOTE) SARS-CoV-2 target nucleic acids are DETECTED.  The SARS-CoV-2 RNA is generally detectable in upper respiratory specimens during the acute phase of infection. Positive results are indicative of the presence of the identified virus, but do not rule out bacterial infection or co-infection with other pathogens not detected by the test. Clinical correlation with patient history and other diagnostic information is necessary to determine patient infection status. The expected result is Negative.  Fact Sheet for Patients: EntrepreneurPulse.com.au  Fact Sheet for Healthcare Providers: IncredibleEmployment.be  This test is not yet approved or cleared by the [place] FDA and  has been authorized for detection and/or diagnosis of SARS-CoV-2 by FDA under an Emergency Use Authorization (EUA).  This EUA will remain in effect (meaning this test can be used) f or the duration of  the COVID-19 declaration under Section 564(b)(1) of the Act, 21 U.S.C. section 360bbb-3(b)(1), unless the authorization is terminated or revoked sooner.     Influenza A by PCR NEGATIVE NEGATIVE Final  Influenza B by PCR NEGATIVE NEGATIVE Final    Comment: (NOTE) The Xpert Xpress SARS-CoV-2/FLU/RSV plus assay is intended as an aid in the diagnosis of influenza from Nasopharyngeal swab specimens and should not be used as a sole basis for treatment. Nasal washings and aspirates are unacceptable for Xpert Xpress SARS-CoV-2/FLU/RSV testing.  Fact Sheet for  Patients: EntrepreneurPulse.com.au  Fact Sheet for Healthcare Providers: IncredibleEmployment.be  This test is not yet approved or cleared by the [place] FDA and has been authorized for detection and/or diagnosis of SARS-CoV-2 by FDA under an Emergency Use Authorization (EUA). This EUA will remain in effect (meaning this test can be used) for the duration of the COVID-19 declaration under Section 564(b)(1) of the Act, 21 U.S.C. section 360bbb-3(b)(1), unless the authorization is terminated or revoked.  Performed at [facility], [place] [address] [place], [place] [address]     Radiology Reports DG Chest Portable 1 View  Result Date: 07/19/2020 CLINICAL DATA:  Cough for 2 weeks.  Possible COVID. EXAM: PORTABLE CHEST 1 VIEW COMPARISON:  Radiograph 04/24/2020, CT 07/17/2006 FINDINGS: Left-sided dialysis catheter and right chest port remain in place. Lower lung volumes from prior exam. Stable heart size and mediastinal contours. Right pericardiac density corresponds to hiatal hernia on prior CT. Minimal patchy opacity at both lung bases. No significant pleural effusion. No pneumothorax. Vascular stent in the left upper arm. IMPRESSION: Minimal patchy opacity at both lung bases, may represent atelectasis or pneumonia. Electronically Signed   By: Keith Rake M.D.   On: 07/19/2020 23:28    Lab Data:  CBC: Recent Labs  Lab 07/21/20 0850 07/22/20 0403 07/23/20 0452 07/24/20 0342 07/25/20 0359  WBC 4.7 4.6 4.9 6.2 7.0  NEUTROABS 3.4 3.8 4.3 5.8 6.5  HGB 11.9* 12.3 12.5 12.6 12.8  HCT 37.5 37.1 39.6 40.2 38.5  MCV 91.9 92.3 93.4 93.1 91.7  PLT 145* 171 191 205 725   Basic Metabolic Panel: Recent Labs  Lab 07/20/20 1530 07/21/20 0850 07/22/20 0403 07/23/20 0452 07/24/20 0342 07/25/20 0359  NA 137 138 138 135 139 138  K >7.5* 4.4 5.8* 5.3* 5.7* 4.6  CL 96* 96* 97* 96* 100 96*  CO2 20* 26 28 27 23 28   GLUCOSE 202* 83 167*  183* 190* 220*  BUN 56* 20 34* 25* 49* 35*  CREATININE 13.51* 7.15* 8.50* 5.77* 7.52* 5.29*  CALCIUM 5.9* 7.4* 7.1* 7.5* 7.6* 8.2*  PHOS 7.8*  --   --   --   --   --    GFR: Estimated Creatinine Clearance: 7.4 mL/min (A) (by C-G formula based on SCr of 5.29 mg/dL (H)). Liver Function Tests: Recent Labs  Lab 07/21/20 0850 07/22/20 0403 07/23/20 0452 07/24/20 0342 07/25/20 0359  AST 16 13* 13* 11* 12*  ALT 10 8 9 7 7   ALKPHOS 54 50 48 48 56  BILITOT 0.8 0.6 0.4 0.7 0.5  PROT 6.2* 5.9* 6.1* 5.9* 6.0*  ALBUMIN 3.1* 2.9* 3.0* 3.0* 3.0*   No results for input(s): LIPASE, AMYLASE in the last 168 hours. No results for input(s): AMMONIA in the last 168 hours. Coagulation Profile: No results for input(s): INR, PROTIME in the last 168 hours. Cardiac Enzymes: No results for input(s): CKTOTAL, CKMB, CKMBINDEX, TROPONINI in the last 168 hours. BNP (last 3 results) No results for input(s): PROBNP in the last 8760 hours. HbA1C: No results for input(s): HGBA1C in the last 72 hours. CBG: Recent Labs  Lab 07/25/20 0740 07/25/20 1224 07/25/20 1739 07/25/20 2036  07/26/20 0738  GLUCAP 210* 231* 224* 185* 151*   Lipid Profile: No results for input(s): CHOL, HDL, LDLCALC, TRIG, CHOLHDL, LDLDIRECT in the last 72 hours. Thyroid Function Tests: No results for input(s): TSH, T4TOTAL, FREET4, T3FREE, THYROIDAB in the last 72 hours. Anemia Panel: No results for input(s): VITAMINB12, FOLATE, FERRITIN, TIBC, IRON, RETICCTPCT in the last 72 hours. Urine analysis:    Component Value Date/Time   COLORURINE YELLOW 04/24/2020 1528   APPEARANCEUR HAZY (A) 04/24/2020 1528   LABSPEC 1.013 04/24/2020 1528   PHURINE 7.0 04/24/2020 1528   GLUCOSEU NEGATIVE 04/24/2020 1528   HGBUR NEGATIVE 04/24/2020 1528   BILIRUBINUR NEGATIVE 04/24/2020 1528   KETONESUR NEGATIVE 04/24/2020 1528   PROTEINUR 100 (A) 04/24/2020 1528   UROBILINOGEN 0.2 01/29/2007 1500   NITRITE NEGATIVE 04/24/2020 [place] 04/24/2020 [place] M.D. [facility] 07/26/2020, 12:14 PM   Call night coverage person covering after 7pm

## 2020-07-26 NOTE — Progress Notes (Signed)
[name] Progress Note   Assessment/ Plan:   OP HD:[facility] and [facility] After discharge- MWF 3rd shift at [facility]   4h  77kg  P4  Hep none  2/2.5 bath  TDC/ LUA AVF   Calc 1.75   mircera 75 q 2 venofer 100   Midodrine ?   Assessment/ Plan: 1. COVID-treatment per primary.  Steroids/ remdesivir 2. ESRD:MWF HD.  s/p HD 12/20.  Pre HD K was 5.7, today 4.6.  Stopped lokelma.  HD today on schedule  3. HD access: has TDC and AVF. AVF has been used but she has been infiltrated multiple times and the plan per [facility]VS was AVG. This can be accomplished as an OP.   3. Hypotension/ volume  - was on midodrine 10 tid at home, continues here. 2-3 kg under dry wt. BP's low to normal, edema resolved. 5-6 L removed w/ HD here.  Will likely need new EDW upon discharge. 4. Anemia of ESRD:hgb over 10- No ESA needed at present.  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 COPD/ OSA 7. Afib- on amiodarone and Eliquis 8. Gout 9. DM2   Subjective:    For HD today.      Objective:   BP (!) 90/41   Pulse 60   Temp 97.7 F (36.5 C) (Axillary)   Resp 16   Ht 4\' 8"  (1.422 m)   Wt 72.8 kg   LMP  (LMP Unknown)   SpO2 95%   BMI 35.98 kg/m   Physical Exam:  Not examined today 12/22, see exam from yesterday 12/21 Gen: sitting in chair, NAD CVS: RRR Resp: nonlabored Abd: obese Ext: no LE edema ACCESS: L IJ TDC and L AVF, nonfunctional  Labs: BMET Recent Labs  Lab 07/19/20 2254 07/20/20 1530 07/21/20 0850 07/22/20 0403 07/23/20 0452 07/24/20 0342 07/25/20 0359  NA 141 137 138 138 135 139 138  K 6.8* >7.5* 4.4 5.8* 5.3* 5.7* 4.6  CL 102 96* 96* 97* 96* 100 96*  CO2 18* 20* 26 28 27 23 28   GLUCOSE 76 202* 83 167* 183* 190* 220*  BUN 57* 56* 20 34* 25* 49* 35*  CREATININE 12.99* 13.51* 7.15* 8.50* 5.77* 7.52* 5.29*  CALCIUM 6.3* 5.9* 7.4* 7.1* 7.5* 7.6* 8.2*  PHOS  --  7.8*  --   --   --   --   --    CBC Recent Labs  Lab 07/22/20 0403 07/23/20 0452  07/24/20 0342 07/25/20 0359  WBC 4.6 4.9 6.2 7.0  NEUTROABS 3.8 4.3 5.8 6.5  HGB 12.3 12.5 12.6 12.8  HCT 37.1 39.6 40.2 38.5  MCV 92.3 93.4 93.1 91.7  PLT 171 191 205 187      Medications:    . allopurinol  100 mg Oral BID  . amiodarone  200 mg Oral Daily  . apixaban  2.5 mg Oral BID  . vitamin C  500 mg Oral Daily  . aspirin EC  81 mg Oral Daily  . atorvastatin  40 mg Oral QHS  . Chlorhexidine Gluconate Cloth  6 each Topical Daily  . ferric citrate  210 mg Oral BID WC  . fluticasone furoate-vilanterol  1 puff Inhalation Daily  . gabapentin  100 mg Oral QHS  . guaiFENesin  600 mg Oral BID  . heparin sodium (porcine)      . insulin aspart  0-6 Units Subcutaneous TID WC  . methylPREDNISolone (SOLU-MEDROL) injection  40 mg Intravenous  Daily  . midodrine  10 mg Oral TID WC  . pantoprazole  40 mg Oral Daily  . umeclidinium bromide  1 puff Inhalation Daily  . zinc sulfate  220 mg Oral Daily     Madelon Lips, MD 07/26/2020, 11:43 AM

## 2020-07-26 NOTE — TOC Initial Note (Signed)
Transition of Care [facility]) - Initial/Assessment Note    Patient Details  Name: [name] MRN: [id number] Date of Birth: 08-09-1945  Transition of Care [facility]) CM/SW Contact:    Pollie Friar, RN Phone Number: 07/26/2020, 1:55 PM  Clinical Narrative:                 CM spoke to the patient over the phone yesterday and the son today.  Recommendations are for [facility] services. Pt is without a preference. [facility] arranged and accepted the referral. Pt has all needed DME at home.  Son can bring oxygen as needed at d/c for transport home.  TOC following.  Expected Discharge Plan: [place] Barriers to Discharge: Continued Medical Work up   Patient Goals and CMS Choice   CMS Medicare.gov Compare Post Acute Care list provided to:: Patient Choice offered to / list presented to : [place]  Expected Discharge Plan and Services Expected Discharge Plan: [place]   Discharge Planning Services: CM Consult Post Acute Care Choice: [place] arrangements for the past 2 months: [place]: PT,OT [place] Agency: [place] Date [facility] Agency Contacted: 07/26/20   Representative spoke with at [place]: Tommi Rumps  Prior Living Arrangements/Services Living arrangements for the past 2 months: [place] Lives with:: Adult Children Patient language and need for interpreter reviewed:: Yes Do you feel safe going back to the place where you live?: Yes      Need for Family Participation in Patient Care: Yes (Comment) Care giver support system in place?: Yes (comment) Current home services: DME (oxygen, bipap, walker, shower seat, 3 in 1) Criminal Activity/Legal Involvement Pertinent to Current Situation/Hospitalization: No - Comment as needed  Activities of Daily Living Home Assistive Devices/Equipment: Walker (specify type) ADL Screening (condition at time of  admission) Patient's cognitive ability adequate to safely complete daily activities?: Yes Is the patient deaf or have difficulty hearing?: No Does the patient have difficulty seeing, even when wearing glasses/contacts?: No Does the patient have difficulty concentrating, remembering, or making decisions?: No Patient able to express need for assistance with ADLs?: Yes Does the patient have difficulty dressing or bathing?: No Independently performs ADLs?: Yes (appropriate for developmental age) Does the patient have difficulty walking or climbing stairs?: Yes Weakness of Legs: Both Weakness of Arms/Hands: None  Permission Sought/Granted                  Emotional Assessment       Orientation: : Oriented to Self,Oriented to Place,Oriented to  Time,Oriented to Situation Alcohol / Substance Use: Not Applicable Psych Involvement: No (comment)  Admission diagnosis:  Hyperkalemia [E87.5] ESRD (end stage renal disease) ([place]) [N18.6] Acute respiratory failure with hypoxia ([place]) [J96.01] Pneumonia due to COVID-19 virus [U07.1, J12.82] Patient Active Problem List   Diagnosis Date Noted  . Acute respiratory failure due to COVID-19 ([place]) 07/20/2020  . Acute respiratory failure with hypoxia ([place]) 07/20/2020  . Allergy, unspecified, sequela 06/28/2020  . Personal history of anaphylaxis 06/28/2020  . Recurrent falls 04/25/2020  . Fall 04/24/2020  . ESRD (end stage renal disease) on dialysis ([place])   . Essential hypertension   . Dyslipidemia   . DM (diabetes mellitus), type 2 with renal complications ([place])   . Class 2 obesity with body  mass index (BMI) of 35 to 39.9 without comorbidity   . OSA (obstructive sleep apnea)   . COPD (chronic obstructive pulmonary disease) ([place])   . PAF (paroxysmal atrial fibrillation) ([place])   . Acute respiratory distress 03/09/2020  . Fluid overload, unspecified 03/09/2020  . Acute hyperkalemia 03/06/2020  . GERD with esophagitis 03/06/2020  . Acute  bacterial bronchitis 03/06/2020  . Anemia in chronic kidney disease 11/17/2019  . Coagulation defect, unspecified ([place]) 11/17/2019  . Pain, unspecified 11/17/2019  . Pruritus, unspecified 11/17/2019  . Secondary hyperparathyroidism of renal origin ([place]) 11/17/2019  . Unspecified protein-calorie malnutrition ([employer]) 11/17/2019  . Loss of weight 10/22/2019  . Goals of care, counseling/discussion 07/23/2019  . 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 07/21/2019  . Paroxysmal atrial fibrillation ([place]) 07/21/2019  . Encephalopathy 07/21/2019  . Moderate major depression, single episode ([place]) 07/21/2019  . Pressure ulcer 07/20/2019  . Anorexia 07/10/2019  . Weight gain with edema 07/02/2019  . 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 Neuropathy due to type 2 diabetes mellitus ([place]) 06/22/2019  . Mixed diabetic hyperlipidemia associated with type 2 diabetes mellitus ([place]) 06/22/2019  . Chronic gout 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 06/22/2019  . Quality of life palliative care encounter 06/19/2019  . Atrial fibrillation with RVR ([place])   . Atrial flutter with rapid ventricular response ([place]) 06/16/2019  . Dyspnea and respiratory abnormalities 06/01/2019  . Acute on chronic respiratory failure with hypoxia and hypercapnia with respiratory acidosis 06/01/2019  . Anasarca 05/31/2019  . Nausea without vomiting 05/31/2019  . Generalized abdominal pain 05/31/2019  . History of colonic polyps 05/31/2019  . Lymphedema of both lower extremities 04/28/2019  . Cellulitis of finger of right hand 08/19/2018  . Chronic respiratory failure with hypoxia ([place]) 07/13/2018  . Acute renal failure superimposed on stage 3 chronic kidney disease ([place]) 07/13/2018  . Chronic diastolic CHF (congestive heart failure) ([place]) 07/13/2018  . COPD (chronic obstructive pulmonary disease) ([place]) 07/13/2018  . Flatulence  09/18/2017  . IBS (irritable bowel syndrome) 04/03/2017  . Acute on chronic diastolic CHF (congestive heart failure) ([place]) 05/08/2016  . COPD with acute exacerbation ([place]) 05/08/2016  . Constipation 04/25/2016  . Peripheral vertigo 06/27/2015  . Port-A-Cath in place 10/06/2014  . Chronic obstructive pulmonary disease ([place]) 04/30/2014  . Obesity, Class III, BMI 40-49.9 (morbid obesity) ([place]) 04/30/2014  . Hemorrhoids, internal, with bleeding 11/10/2013  . Anemia 08/18/2013  . Other diseases of tongue 08/18/2013  . Chest pain 09/05/2012  . Arthropathy 12/09/2011  . Edema 12/09/2011  . Gout 12/09/2011  . Hiatal hernia with GERD and esophagitis   . Obstructive chronic bronchitis without exacerbation ([place]) 12/26/2009  . Dysphagia 12/26/2009  . DM type 2 with diabetic peripheral neuropathy ([place]) 12/25/2009  . Vitamin B deficiency 12/25/2009  . Iron deficiency anemia due to chronic blood loss 12/25/2009  . Benign essential HTN 12/25/2009  . DEGENERATIVE DISC DISEASE 12/25/2009  . OSA (obstructive sleep apnea) 12/25/2009  . HLD (hyperlipidemia) 12/25/2009  . Type 2 diabetes mellitus with ESRD (end-stage renal disease) ([place]) 12/25/2009  . Narrowing of intervertebral disc space 12/25/2009   PCP:  [name], MD Pharmacy:   [facility] - [address] [place] [address] [place] [address] Phone: [phone] Fax: [phone]  [facility] [place], [place]  [place] [address] [place] [address] Phone: [phone] Fax: [phone]  [id number] [place], [place] [address] [place] [address] Phone: [phone] Fax: [phone]     Social Determinants of Health (SDOH) Interventions    Readmission Risk Interventions Readmission Risk Prevention Plan  07/20/2020 04/28/2020 03/08/2020  Transportation Screening Complete Complete Complete  PCP or Specialist Appt within 3-5 Days - Not Complete -  Not Complete comments - going to SNF -  [place] or [place] - Complete -  [place] or Home Care Consult comments - - -  Social Work Consult for [place] Planning/Counseling - Complete -  Palliative Care Screening - Not Applicable -  Medication Review ([place]) Referral to Pharmacy Complete Complete  PCP or Specialist appointment within 3-5 days of discharge Complete - Complete  PCP/Specialist Appt Not Complete comments - - -  [place] or Home Care Consult Complete - Complete  SW Recovery Care/Counseling Consult Complete - Complete  Palliative Care Screening Not Applicable - Not [place] Not Applicable - Not Applicable  Some recent data might be hidden

## 2020-07-26 NOTE — Progress Notes (Signed)
PT Cancellation Note  Patient Details Name: [name] MRN: [id number] DOB: February 15, 1946   Cancelled Treatment:    Reason Eval/Treat Not Completed: Patient at procedure or test/unavailable patient unavailable for PT, OTF at the moment. Will continue efforts as time/schedule allow    Windell Norfolk, DPT, PN1   Supplemental Physical Therapist [place]    Pager [phone] Acute Rehab Office [phone]

## 2020-07-27 LAB — GLUCOSE, CAPILLARY
Glucose-Capillary: 169 mg/dL — ABNORMAL HIGH (ref 70–99)
Glucose-Capillary: 166 mg/dL — ABNORMAL HIGH (ref 70–99)

## 2020-07-27 MED ORDER — HEPARIN SOD (PORK) LOCK FLUSH 100 UNIT/ML IV SOLN
500.0000 [IU] | INTRAVENOUS | Status: DC | PRN
  Filled 2020-07-27: qty 5

## 2020-07-27 NOTE — Discharge Summary (Signed)
[name], is a 74 y.o. female  DOB Jul 04, 1946  MRN [id number].  Admission date:  07/19/2020  Admitting Physician  [name], MD  Discharge Date:  07/27/2020   Primary MD  [name], MD  Recommendations for primary care physician for things to follow:  - please check CBC, CMP during next visit.   Admission Diagnosis  Hyperkalemia [E87.5] ESRD (end stage renal disease) ([place]) [N18.6] Acute respiratory failure with hypoxia (HCC) [J96.01] Pneumonia due to COVID-19 virus [U07.1, J12.82]   Discharge Diagnosis  Hyperkalemia [E87.5] ESRD (end stage renal disease) ([place]) [N18.6] Acute respiratory failure with hypoxia ([place]) [J96.01] Pneumonia due to COVID-19 virus [U07.1, J12.82]    Active Problems:   DM type 2 with diabetic peripheral neuropathy (HCC)   Benign essential HTN   OSA (obstructive sleep apnea)   HLD (hyperlipidemia)   Chronic obstructive pulmonary disease (HCC)   Pressure ulcer   ESRD (end stage renal disease) on dialysis (HCC)   PAF (paroxysmal atrial fibrillation) (HCC)   Acute respiratory failure due to COVID-19 [facility])   Acute respiratory failure with hypoxia ([place])      Past Medical History:  Diagnosis Date  . Blood transfusion without reported diagnosis   . CAD (coronary artery disease)    Nonobstructive at cardiac catheterization 2000  . Cataract   . Cervical cancer ([place]) 1978  . CHF (congestive heart failure) ([place])   . Chronic back pain   . Chronic kidney disease    MWF dialysis  . Class 2 obesity with body mass index (BMI) of 35 to 39.9 without comorbidity   . COPD (chronic obstructive pulmonary disease) ([place])   . Degenerative disc disease   . DM (diabetes mellitus), type 2 with renal complications ([place])   . Dyslipidemia   . Dysrhythmia    a-fib  . ESRD (end stage renal disease) on dialysis ([place])   . Essential hypertension   . Essential  hypertension, benign   . GERD (gastroesophageal reflux disease)   . Gout   . Hiatal hernia 07/27/2013  . History of diverticulitis of colon   . History of hiatal hernia   . Iron deficiency anemia   . Irritable bowel syndrome   . Lumbar radiculopathy   . Mixed hyperlipidemia   . 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 Neuropathy   . OSA (obstructive sleep apnea)   . Osteoporosis   . Ovarian cancer [facility]) 1978   patient denies. States this was cervical cancer  . Oxygen deficiency    room air  . PAF (paroxysmal atrial fibrillation) ([place])   . Sleep apnea   . Type 2 diabetes mellitus ([place])   . Vitamin B deficiency 12/25/2009  . Vitamin B12 deficiency     Past Surgical History:  Procedure Laterality Date  . ABDOMINAL HYSTERECTOMY    . AV FISTULA PLACEMENT Left 08/05/2019   Procedure: ARTERIOVENOUS (AV) FISTULA CREATION LEFT ARM;  Surgeon: Waynetta Sandy, MD;  Location: [place];  Service: Vascular;  Laterality: Left;  .  BACK SURGERY    . BASCILIC VEIN TRANSPOSITION Left 10/05/2019   Procedure: SECOND STAGE LEFT BASCILIC VEIN TRANSPOSITION;  Surgeon: Waynetta Sandy, MD;  Location: [place];  Service: Vascular;  Laterality: Left;  . Benign breast cysts    . CHOLECYSTECTOMY    . COLONOSCOPY  10/01/2006   SLF:Pan colonic diverticulosis and moderate internal hemorrhoids/ Otherwise no polyps, masses, inflammatory changes or AVMs/  . COLONOSCOPY  2011   SLF: pancolonic diverticulosis, large internal hemorrhoids  . COLONOSCOPY N/A 01/26/2016   Procedure: COLONOSCOPY;  Surgeon: [name], MD;  [facility];  Service: Endoscopy;  Laterality: N/A;  830   . COLONOSCOPY WITH PROPOFOL N/A 02/10/2020   Procedure: COLONOSCOPY WITH PROPOFOL;  Surgeon: Daneil Dolin, MD;  [facility];  Service: Endoscopy;  Laterality: N/A;  10:45am  . ESOPHAGOGASTRODUODENOSCOPY  11/19/2006   SLF: Large hiatal hernia without evidence of [place]  ulcers/. Distal esophageal stricture, which allowed the gastroscope to pass without resistance.  A 16 mm Savary later passed with mild resistance/ Normal stomach.sb bx negative  . ESOPHAGOGASTRODUODENOSCOPY  10/01/2006   AYT:KZSWF hiatal hernia.  Distal esophagus without evidence of   erythema, ulceration or Barrett's esophagus  . ESOPHAGOGASTRODUODENOSCOPY  2011   SLF: large hh, distal esophageal web narrowing to 18mm s/p dilation to 71mm  . ESOPHAGOGASTRODUODENOSCOPY N/A 08/06/2013   SLF: 1. Stricture at the gastroesophageal junction 2. large hiatal hernia. 3. Mild erosive gastritis.  Marland Kitchen ESOPHAGOGASTRODUODENOSCOPY (EGD) WITH PROPOFOL N/A 07/19/2019   rourk: Mild erosive reflux esophagitis.  Mild Schatzki ring status post dilation.  Large hiatal hernia with at least one half of the stomach above the diaphragm.  Gastric mucosa erythematous.  Marland Kitchen GIVENS CAPSULE STUDY N/A 08/06/2013   INCOMPLETE-SMALL BOWLE ULCERS  . IR FLUORO GUIDE CV LINE LEFT  07/29/2019  . IR US GUIDE VASC ACCESS LEFT  07/29/2019  . KNEE SURGERY Right   . PARTIAL HYSTERECTOMY  1978  . small bowel capsule  2008   negative  . SPINE SURGERY    . TONSILLECTOMY AND ADENOIDECTOMY    . Two back surgeries/fusion    . UMBILICAL HERNIA REPAIR  2010       History of present illness and  Hospital Course:     Kindly see H&P for history of present illness and admission details, please review complete Labs, Consult reports and Test reports for all details in brief  HPI  from the history and physical done on the day of admission 07/19/2020  HPI: [name] is a 74 y.o. female with history of ESRD on hemodialysis, diabetes mellitus type 2, paroxysmal atrial fibrillation, gout has been experiencing worsening shortness of breath with productive cough generalized weakness and vomiting and diarrhea for the last week.  Patient has missed her dialysis almost for last 2 sessions because patient states there was some change in the schedule  and when she went last Monday about 3 days ago she had a long wait time and had to go back home.  Patient states her family members at home has been diagnosed with Covid infection.  Patient has been having productive cough denies chest pain has been having some diarrhea.  ED Course: In the ER patient was hypoxic requiring 4 L oxygen with chest x-ray showing bilateral infiltrates Covid test was positive.  The labs are significant for hyperkalemia.  EKG shows peaked T waves and patient was given [facility] and nephrology consulted.  Patient was hypoglycemic in the ER for  which patient was started on D10.  Patient admitted for acute respiratory failure with hypoxia likely combination of fluid overload and Covid infection with hyperglycemia.   Hospital Course    Acute on chronic hypoxic respiratory failure due to acute COVID-19 viral pneumonia during the ongoing COVID-19 pandemic- POA -He is vaccinated with [place] this March and April, but no booster yet. - Patient presented with productive cough, generalized weakness, nausea vomiting diarrhea, shortness of breath for a week.  Chest x-ray showed bilateral infiltrates.  Likely has combination of fluid overload due to missed hemodialysis x2 and COVID-19 - Improving with HD  -He was treated with IV remdesivir, on Solu-Medrol, no further treatment indicated on discharge. -She is on 2 L nasal cannula intermittently, which is her baseline.    DM type 2 with diabetic peripheral neuropathy (HCC) insulin-dependent -Continue with home regimen   Hyperkalemia, hypocalcemia -Resolved with dialysis    Benign essential HTN/hypotension -BP stable, cont midodrine   ESRD on HD, MWF -Missed last 2 sessions with fluid overload on presentation, her hemodialysis schedule has been adjusted to Monday Wednesday Friday third shift, given her COVID-19 positive status  Anemia of chronic disease, secondary to ESRD -H&H stable.   Paroxysmal atrial fibrillation -Rate  currently controlled, continue amiodarone - continue Eliquis  GERD -Continue PPI    Chronic obstructive pulmonary disease (HCC) -Continue IV steroids, Incruse Ellipta, Ventolin inhaler -No acute wheezing   Obesity Estimated body mass index is 35.98 kg/m as calculated from the following:   Height as of this encounter: 4\' 8"  (1.422 m).   Weight as of this encounter: 72.8 kg.  - will need lifestyle changes and diet control  Pressure injury, POA -Sacrum with stage II, wound care per nursi  Pressure Injury 07/20/20 Sacrum Mid Stage 2 -  Partial thickness loss of dermis presenting as a shallow open injury with a red, pink wound bed without slough. dull maroon color (Active)  07/20/20 0640  Location: Sacrum  Location Orientation: Mid  Staging: Stage 2 -  Partial thickness loss of dermis presenting as a shallow open injury with a red, pink wound bed without slough.  Wound Description (Comments): dull maroon color  Present on Admission: Yes        Discharge Condition:  stable   Follow UP   Follow-up Information    [facility] Follow up.   Specialty: Home Health Services Why: The home health agency will contact you for the first home visit. Contact information: [place] [address] [phone]                 Discharge Instructions  and  Discharge Medications     Discharge Instructions    Discharge instructions   Complete by: As directed    Follow with Primary MD [name], MD in 14  days   Get CBC, CMP,  checked  by Primary MD next visit.    Activity: As tolerated with Full fall precautions use walker/cane & assistance as needed   Disposition Home    Diet: Renal/Carb modified  For Heart failure patients - Check your Weight same time everyday, if you gain over 2 pounds, or you develop in leg swelling, experience more shortness of breath or chest pain, call your Primary MD immediately. Follow  Cardiac Low Salt Diet and 1.5 lit/day fluid restriction.   On your next visit with your primary care physician please Get Medicines reviewed and adjusted.   Please request your  Prim.MD to go over all Hospital Tests and Procedure/Radiological results at the follow up, please get all Hospital records sent to your Prim MD by signing hospital release before you go home.   If you experience worsening of your admission symptoms, develop shortness of breath, life threatening emergency, suicidal or homicidal thoughts you must seek medical attention immediately by calling 911 or calling your MD immediately  if symptoms less severe.  You Must read complete instructions/literature along with all the possible adverse reactions/side effects for all the Medicines you take and that have been prescribed to you. Take any new Medicines after you have completely understood and accpet all the possible adverse reactions/side effects.   Do not drive, operating heavy machinery, perform activities at heights, swimming or participation in water activities or provide baby sitting services if your were admitted for syncope or siezures until you have seen by Primary MD or a Neurologist and advised to do so again.  Do not drive when taking Pain medications.    Do not take more than prescribed Pain, Sleep and Anxiety Medications  Special Instructions: If you have smoked or chewed Tobacco  in the last 2 yrs please stop smoking, stop any regular Alcohol  and or any Recreational drug use.  Wear Seat belts while driving.   Please note  You were cared for by a hospitalist during your hospital stay. If you have any questions about your discharge medications or the care you received while you were in the hospital after you are discharged, you can call the unit and asked to speak with the hospitalist on call if the hospitalist that took care of you is not available. Once you are discharged, your primary care physician will  handle any further medical issues. Please note that NO REFILLS for any discharge medications will be authorized once you are discharged, as it is imperative that you return to your primary care physician (or establish a relationship with a primary care physician if you do not have one) for your aftercare needs so that they can reassess your need for medications and monitor your lab values.   Increase activity slowly   Complete by: As directed    No wound care   Complete by: As directed      Allergies as of 07/27/2020   No Known Allergies     Medication List    TAKE these medications   acetaminophen 325 MG tablet Commonly known as: TYLENOL Take 650 mg by mouth daily as needed for headache (pain).   albuterol 108 (90 Base) MCG/ACT inhaler Commonly known as: VENTOLIN HFA Inhale 2 puffs into the lungs every 6 (six) hours as needed for wheezing or shortness of breath.   albuterol (2.5 MG/3ML) 0.083% nebulizer solution Commonly known as: PROVENTIL Take 2.5 mg by nebulization 4 (four) times daily as needed for wheezing or shortness of breath.   allopurinol 100 MG tablet Commonly known as: ZYLOPRIM Take 1 tablet (100 mg total) by mouth 2 (two) times daily.   amiodarone 200 MG tablet Commonly known as: PACERONE Take 1 tablet (200 mg total) by mouth daily.   apixaban 2.5 MG Tabs tablet Commonly known as: ELIQUIS Take 2.5 mg by mouth 2 (two) times daily.   aspirin EC 81 MG tablet Take 81 mg by mouth daily.   atorvastatin 40 MG tablet Commonly known as: LIPITOR Take 1 tablet (40 mg total) by mouth at bedtime.   Biotin 5000 MCG Tabs Take 5,000 mcg by mouth daily.  Clear Eyes for Dry Eyes 1-0.25 % Soln Generic drug: Carboxymethylcellul-Glycerin Place 1 drop into both eyes daily as needed (dry eys).   diclofenac Sodium 1 % Gel Commonly known as: VOLTAREN Apply 2 g topically 4 (four) times daily.   esomeprazole 40 MG capsule Commonly known as: NEXIUM Take 40 mg by mouth  daily.   famotidine 40 MG tablet Commonly known as: PEPCID TAKE 1 TABLET AT BEDTIME AS NEEDED TO CONTROL REFLUX What changed:   how much to take  how to take this  when to take this  reasons to take this  additional instructions   ferric citrate 1 GM 210 MG(Fe) tablet Commonly known as: AURYXIA Take 210 mg by mouth 2 (two) times daily with a meal.   gabapentin 100 MG capsule Commonly known as: NEURONTIN Take 100 mg by mouth at bedtime.   insulin lispro 100 UNIT/ML injection Commonly known as: HUMALOG Inject 5-16 Units into the skin 3 (three) times daily as needed (greater than 120).   Lantus SoloStar 100 UNIT/ML Solostar Pen Generic drug: insulin glargine Inject 10 Units into the skin daily as needed (over 120 blood sugars).   lidocaine 5 % Commonly known as: Lidoderm Place 1 patch onto the skin daily. Remove & Discard patch within 12 hours or as directed by MD What changed:   when to take this  reasons to take this   midodrine 10 MG tablet Commonly known as: PROAMATINE Take 1 tablet (10 mg total) by mouth 3 (three) times daily with meals.   nitroGLYCERIN 0.4 MG SL tablet Commonly known as: NITROSTAT Place 0.4 mg under the tongue every 5 (five) minutes x 3 doses as needed for chest pain.   omeprazole 40 MG capsule Commonly known as: PRILOSEC Take 40 mg by mouth daily.   ondansetron 4 MG tablet Commonly known as: ZOFRAN Take 4 mg by mouth daily as needed for nausea/vomiting.   OXYGEN Inhale 2 L into the lungs as needed (shortness of breath).   PRESCRIPTION MEDICATION Inhale into the lungs at bedtime. CPAP   tamsulosin 0.4 MG Caps capsule Commonly known as: FLOMAX Take 1 capsule (0.4 mg total) by mouth daily.   traMADol 50 MG tablet Commonly known as: ULTRAM Take 1 tablet (50 mg total) by mouth every 8 (eight) hours as needed (pain).   Trelegy Ellipta 100-62.5-25 MCG/INH Aepb Generic drug: Fluticasone-Umeclidin-Vilant Inhale 1 puff into the  lungs daily as needed (respiratory issues.).   Trelegy Ellipta 100-62.5-25 MCG/INH Aepb Generic drug: Fluticasone-Umeclidin-Vilant Inhale 1 puff into the lungs daily as needed (shortness of breath/wheezing).         Diet and Activity recommendation: See Discharge Instructions above   Consults obtained - renal   Major procedures and Radiology Reports - PLEASE review detailed and final reports for all details, in brief -      DG Chest Portable 1 View  Result Date: 07/19/2020 CLINICAL DATA:  Cough for 2 weeks.  Possible COVID. EXAM: PORTABLE CHEST 1 VIEW COMPARISON:  Radiograph 04/24/2020, CT 07/17/2006 FINDINGS: Left-sided dialysis catheter and right chest port remain in place. Lower lung volumes from prior exam. Stable heart size and mediastinal contours. Right pericardiac density corresponds to hiatal hernia on prior CT. Minimal patchy opacity at both lung bases. No significant pleural effusion. No pneumothorax. Vascular stent in the left upper arm. IMPRESSION: Minimal patchy opacity at both lung bases, may represent atelectasis or pneumonia. Electronically Signed   By: Keith Rake M.D.   On: 07/19/2020 23:28  Micro Results     Recent Results (from the past 240 hour(s))  Resp Panel by RT-PCR (Flu A&B, Covid) Nasopharyngeal Swab     Status: Abnormal   Collection Time: 07/19/20 11:02 PM   Specimen: Nasopharyngeal Swab; Nasopharyngeal(NP) swabs in vial transport medium  Result Value Ref Range Status   SARS Coronavirus 2 by RT PCR POSITIVE (A) NEGATIVE Final    Comment: RESULT CALLED TO, READ BACK BY AND VERIFIED WITH: C CRISCO RN 07/20/20 0047 (NOTE) SARS-CoV-2 target nucleic acids are DETECTED.  The SARS-CoV-2 RNA is generally detectable in upper respiratory specimens during the acute phase of infection. Positive results are indicative of the presence of the identified virus, but do not rule out bacterial infection or co-infection with other pathogens not detected  by the test. Clinical correlation with patient history and other diagnostic information is necessary to determine patient infection status. The expected result is Negative.  Fact Sheet for Patients: EntrepreneurPulse.com.au  Fact Sheet for Healthcare Providers: IncredibleEmployment.be  This test is not yet approved or cleared by the [place] FDA and  has been authorized for detection and/or diagnosis of SARS-CoV-2 by FDA under an Emergency Use Authorization (EUA).  This EUA will remain in effect (meaning this test can be used) f or the duration of  the COVID-19 declaration under Section 564(b)(1) of the Act, 21 U.S.C. section 360bbb-3(b)(1), unless the authorization is terminated or revoked sooner.     Influenza A by PCR NEGATIVE NEGATIVE Final   Influenza B by PCR NEGATIVE NEGATIVE Final    Comment: (NOTE) The Xpert Xpress SARS-CoV-2/FLU/RSV plus assay is intended as an aid in the diagnosis of influenza from Nasopharyngeal swab specimens and should not be used as a sole basis for treatment. Nasal washings and aspirates are unacceptable for Xpert Xpress SARS-CoV-2/FLU/RSV testing.  Fact Sheet for Patients: EntrepreneurPulse.com.au  Fact Sheet for Healthcare Providers: IncredibleEmployment.be  This test is not yet approved or cleared by the [place] FDA and has been authorized for detection and/or diagnosis of SARS-CoV-2 by FDA under an Emergency Use Authorization (EUA). This EUA will remain in effect (meaning this test can be used) for the duration of the COVID-19 declaration under Section 564(b)(1) of the Act, 21 U.S.C. section 360bbb-3(b)(1), unless the authorization is terminated or revoked.  Performed at [facility], [place] [address] [place], [place] [address]        Today   Subjective:   Pearlie Nies today has no headache,no chest or  abdominal pain,no new weakness  tingling or numbness, feels much better wants to go home today.  Reports some dry cough.  Objective:   Blood pressure (!) 139/48, pulse (!) 46, temperature 97.6 F (36.4 C), temperature source Oral, resp. rate 15, height 4\' 8"  (1.422 m), weight 72.2 kg, SpO2 93 %.   Intake/Output Summary (Last 24 hours) at 07/27/2020 1029 Last data filed at 07/26/2020 1448 Gross per 24 hour  Intake 240 ml  Output 1000 ml  Net -760 ml    Exam Awake Alert, Oriented x 3, No new F.N deficits, Normal affect Symmetrical Chest wall movement, Good air movement bilaterally, CTAB RRR,No Gallops,Rubs or new Murmurs, No Parasternal Heave +ve B.Sounds, Abd Soft, Non tender. No Cyanosis, Clubbing or edema, No new Rash or bruise  Data Review   CBC w Diff:  Lab Results  Component Value Date   WBC 7.0 07/25/2020   HGB 12.8 07/25/2020   HCT 38.5 07/25/2020   PLT 187 07/25/2020   LYMPHOPCT 5  07/25/2020   MONOPCT 1 07/25/2020   EOSPCT 0 07/25/2020   BASOPCT 0 07/25/2020    CMP:  Lab Results  Component Value Date   NA 138 07/25/2020   K 4.6 07/25/2020   CL 96 (L) 07/25/2020   CO2 28 07/25/2020   BUN 35 (H) 07/25/2020   CREATININE 5.29 (H) 07/25/2020   CREATININE 1.68 (H) 10/10/2017   PROT 6.0 (L) 07/25/2020   ALBUMIN 3.0 (L) 07/25/2020   BILITOT 0.5 07/25/2020   ALKPHOS 56 07/25/2020   AST 12 (L) 07/25/2020   ALT 7 07/25/2020  .   Total Time in preparing paper work, data evaluation and todays exam - 50 minutes  Phillips Climes M.D on 07/27/2020 at 10:29 AM  [facility]   Office  [phone]

## 2020-07-27 NOTE — Discharge Instructions (Signed)
Person Under Monitoring Name: [name]  Location: [place] [address]   Infection Prevention Recommendations for Individuals Confirmed to have, or Being Evaluated for, 2019 Novel Coronavirus (COVID-19) Infection Who Receive Care at Home  Individuals who are confirmed to have, or are being evaluated for, COVID-19 should follow the prevention steps below until a healthcare provider or local or state health department says they can return to normal activities.  Stay home except to get medical care You should restrict activities outside your home, except for getting medical care. Do not go to work, school, or public areas, and do not use public transportation or taxis.  Call ahead before visiting your doctor Before your medical appointment, call the healthcare provider and tell them that you have, or are being evaluated for, COVID-19 infection. This will help the healthcare provider's office take steps to keep other people from getting infected. Ask your healthcare provider to call the local or state health department.  Monitor your symptoms Seek prompt medical attention if your illness is worsening (e.g., difficulty breathing). Before going to your medical appointment, call the healthcare provider and tell them that you have, or are being evaluated for, COVID-19 infection. Ask your healthcare provider to call the local or state health department.  Wear a facemask You should wear a facemask that covers your nose and mouth when you are in the same room with other people and when you visit a healthcare provider. People who live with or visit you should also wear a facemask while they are in the same room with you.  Separate yourself from other people in your home As much as possible, you should stay in a different room from other people in your home. Also, you should use a separate bathroom, if available.  Avoid sharing household items You should  not share dishes, drinking glasses, cups, eating utensils, towels, bedding, or other items with other people in your home. After using these items, you should wash them thoroughly with soap and water.  Cover your coughs and sneezes Cover your mouth and nose with a tissue when you cough or sneeze, or you can cough or sneeze into your sleeve. Throw used tissues in a lined trash can, and immediately wash your hands with soap and water for at least 20 seconds or use an alcohol-based hand rub.  Wash your [facility] your hands often and thoroughly with soap and water for at least 20 seconds. You can use an alcohol-based hand sanitizer if soap and water are not available and if your hands are not visibly dirty. Avoid touching your eyes, nose, and mouth with unwashed hands.   Prevention Steps for Caregivers and Household Members of Individuals Confirmed to have, or Being Evaluated for, COVID-19 Infection Being Cared for in the Home  If you live with, or provide care at home for, a person confirmed to have, or being evaluated for, COVID-19 infection please follow these guidelines to prevent infection:  Follow healthcare provider's instructions Make sure that you understand and can help the patient follow any healthcare provider instructions for all care.  Provide for the patient's basic needs You should help the patient with basic needs in the home and provide support for getting groceries, prescriptions, and other personal needs.  Monitor the patient's symptoms If they are getting sicker, call his or her medical provider and tell them that the patient has, or is being evaluated for, COVID-19 infection. This will help the healthcare provider's office  take steps to keep other people from getting infected. Ask the healthcare provider to call the local or state health department.  Limit the number of people who have contact with the patient  If possible, have only one caregiver for the  patient.  Other household members should stay in another home or place of residence. If this is not possible, they should stay  in another room, or be separated from the patient as much as possible. Use a separate bathroom, if available.  Restrict visitors who do not have an essential need to be in the home.  Keep older adults, very young children, and other sick people away from the patient Keep older adults, very young children, and those who have compromised immune systems or chronic health conditions away from the patient. This includes people with chronic heart, lung, or kidney conditions, diabetes, and cancer.  Ensure good ventilation Make sure that shared spaces in the home have good air flow, such as from an air conditioner or an opened window, weather permitting.  Wash your hands often  Wash your hands often and thoroughly with soap and water for at least 20 seconds. You can use an alcohol based hand sanitizer if soap and water are not available and if your hands are not visibly dirty.  Avoid touching your eyes, nose, and mouth with unwashed hands.  Use disposable paper towels to dry your hands. If not available, use dedicated cloth towels and replace them when they become wet.  Wear a facemask and gloves  Wear a disposable facemask at all times in the room and gloves when you touch or have contact with the patient's blood, body fluids, and/or secretions or excretions, such as sweat, saliva, sputum, nasal mucus, vomit, urine, or feces.  Ensure the mask fits over your nose and mouth tightly, and do not touch it during use.  Throw out disposable facemasks and gloves after using them. Do not reuse.  Wash your hands immediately after removing your facemask and gloves.  If your personal clothing becomes contaminated, carefully remove clothing and launder. Wash your hands after handling contaminated clothing.  Place all used disposable facemasks, gloves, and other waste in a lined  container before disposing them with other household waste.  Remove gloves and wash your hands immediately after handling these items.  Do not share dishes, glasses, or other household items with the patient  Avoid sharing household items. You should not share dishes, drinking glasses, cups, eating utensils, towels, bedding, or other items with a patient who is confirmed to have, or being evaluated for, COVID-19 infection.  After the person uses these items, you should wash them thoroughly with soap and water.  Wash laundry thoroughly  Immediately remove and wash clothes or bedding that have blood, body fluids, and/or secretions or excretions, such as sweat, saliva, sputum, nasal mucus, vomit, urine, or feces, on them.  Wear gloves when handling laundry from the patient.  Read and follow directions on labels of laundry or clothing items and detergent. In general, wash and dry with the warmest temperatures recommended on the label.  Clean all areas the individual has used often  Clean all touchable surfaces, such as counters, tabletops, doorknobs, bathroom fixtures, toilets, phones, keyboards, tablets, and bedside tables, every day. Also, clean any surfaces that may have blood, body fluids, and/or secretions or excretions on them.  Wear gloves when cleaning surfaces the patient has come in contact with.  Use a diluted bleach solution (e.g., dilute bleach with 1 part  bleach and 10 parts water) or a household disinfectant with a label that says EPA-registered for coronaviruses. To make a bleach solution at home, add 1 tablespoon of bleach to 1 quart (4 cups) of water. For a larger supply, add  cup of bleach to 1 gallon (16 cups) of water.  Read labels of cleaning products and follow recommendations provided on product labels. Labels contain instructions for safe and effective use of the cleaning product including precautions you should take when applying the product, such as wearing gloves or  eye protection and making sure you have good ventilation during use of the product.  Remove gloves and wash hands immediately after cleaning.  Monitor yourself for signs and symptoms of illness Caregivers and household members are considered close contacts, should monitor their health, and will be asked to limit movement outside of the home to the extent possible. Follow the monitoring steps for close contacts listed on the symptom monitoring form.   ? If you have additional questions, contact your local health department or call the epidemiologist on call at [phone] (available 24/7). ? This guidance is subject to change. For the most up-to-date guidance from [facility], please refer to their website: [email or web address]

## 2020-07-27 NOTE — Progress Notes (Signed)
Physical Therapy Treatment Patient Details Name: [name] MRN: [id number] DOB: July 30, 1946 Today's Date: 07/27/2020    History of Present Illness Pt is a 74 y.o. female admitted 07/19/20 with c/o chest discomfort, found to be hypoxic and CXR with bilateral infiltrates. Workup for COVID-19 PNA, fluid overload. PMH includes CAD, COPD, CHF, OSA on BiPAP, DM2, afib, ESRD (HD MWF), obesity.   PT Comments    Pt preparing for d/c home this afternoon. Today's session focused on functional mobility, including ambulation with RW and seated/standing ADL tasks. Pt demonstrates improved stability. DOE 3/4 and fatigue with short bouts of activity. Reviewed educ re: importance of mobility, activity recommendations, energy conservation, IS/FV use. Pt will have necessary DME and support from family.    Follow Up Recommendations  Home health PT;Supervision/Assistance - 24 hour     Equipment Recommendations  None recommended by PT    Recommendations for Other Services       Precautions / Restrictions Precautions Precautions: Fall Restrictions Weight Bearing Restrictions: No    Mobility  Bed Mobility Overal bed mobility: Modified Independent Bed Mobility: Supine to Sit              Transfers Overall transfer level: Modified independent Equipment used: Rolling walker (2 wheeled) Transfers: Sit to/from Stand              Ambulation/Gait Ambulation/Gait assistance: Supervision Gait Distance (Feet): 20 Feet Assistive device: Rolling walker (2 wheeled) Gait Pattern/deviations: Step-through pattern;Decreased stride length;Trunk flexed;Wide base of support Gait velocity: Decreased   General Gait Details: Slow gait with RW, supervision for safety; stop to lean on sink for washup and other ADLs, then return to sit with c/o fatigued; declined further mobility   Stairs             Wheelchair Mobility    Modified Rankin (Stroke Patients Only)       Balance  Overall balance assessment: Needs assistance Sitting-balance support: Feet supported;No upper extremity supported Sitting balance-Leahy Scale: Good Sitting balance - Comments: Indep to don clothes and bilateral socks/shoes sitting at EOB   Standing balance support: During functional activity;No upper extremity supported;Bilateral upper extremity supported;Single extremity supported Standing balance-Leahy Scale: Poor Standing balance comment: Can static stand to adjust clothes without UE support, prefers to lean abdomen against sink with using BUEs for ADL task (washing face, brushing hair)                            Cognition Arousal/Alertness: Awake/alert Behavior During Therapy: Flat affect Overall Cognitive Status: Within Functional Limits for tasks assessed                                 General Comments: appears at baseline      Exercises      General Comments General comments (skin integrity, edema, etc.): SpO2 >90% on RA. Gave pt IS, pulling 1250 mL with good technique (familiar with use from prior hospital admissions). Increased time discussing d/c home today and educ re: importance of mobility, activity recommendations, continued IS/FV use, energy conservation      Pertinent Vitals/Pain Pain Assessment: No/denies pain Pain Intervention(s): Monitored during session    Home Living                      Prior Function            PT Goals (  current goals can now be found in the care plan section) Progress towards PT goals: Progressing toward goals    Frequency    Min 3X/week      PT Plan Current plan remains appropriate    Co-evaluation              AM-PAC PT "6 Clicks" Mobility   Outcome Measure  Help needed turning from your back to your side while in a flat bed without using bedrails?: None Help needed moving from lying on your back to sitting on the side of a flat bed without using bedrails?: None Help needed  moving to and from a bed to a chair (including a wheelchair)?: None Help needed standing up from a chair using your arms (e.g., wheelchair or bedside chair)?: None Help needed to walk in hospital room?: A Little Help needed climbing 3-5 steps with a railing? : A Little 6 Click Score: 22    End of Session   Activity Tolerance: Patient tolerated treatment well Patient left: in chair;with call bell/phone within reach Nurse Communication: Mobility status (pt ready to d/c) PT Visit Diagnosis: Unsteadiness on feet (R26.81);Other abnormalities of gait and mobility (R26.89);Muscle weakness (generalized) (M62.81);History of falling (Z91.81);Difficulty in walking, not elsewhere classified (R26.2)     Time: 1601-0932 PT Time Calculation (min) (ACUTE ONLY): 20 min  Charges:  $Therapeutic Activity: 8-22 mins                     [name], PT, DPT Acute Rehabilitation Services  Pager [phone] Office [phone]  Derry Lory 07/27/2020, 2:48 PM

## 2020-07-28 ENCOUNTER — Telehealth: Payer: Self-pay | Admitting: Physician Assistant

## 2020-07-28 NOTE — Telephone Encounter (Signed)
Transition of care contact from inpatient facility  Date of discharge: 07/27/20 Date of contact: 07/28/20 Method: Phone Spoke to: Patient  Patient contacted to discuss transition of care from recent inpatient hospitalization. Patient was admitted to [facility] from 07/19/20-07/27/20 with discharge diagnosis of COVID 19 pneumonia.  Medication changes were reviewed. Pt has home O2 and home health services are being arranged. Reports no other needs at this time  Patient will follow up with his/her outpatient HD unit on: today, third shift (COVID isolation)-pt aware  Anice Paganini, PA-C 07/28/2020, 10:07 AM  [facility]

## 2020-08-15 ENCOUNTER — Ambulatory Visit: Payer: Medicare Other | Admitting: Gastroenterology

## 2020-09-05 ENCOUNTER — Ambulatory Visit (INDEPENDENT_AMBULATORY_CARE_PROVIDER_SITE_OTHER): Payer: Medicare Other | Admitting: Vascular Surgery

## 2020-09-05 ENCOUNTER — Encounter: Payer: Self-pay | Admitting: Vascular Surgery

## 2020-09-05 ENCOUNTER — Encounter: Payer: Self-pay | Admitting: *Deleted

## 2020-09-05 ENCOUNTER — Other Ambulatory Visit: Payer: Self-pay

## 2020-09-05 ENCOUNTER — Other Ambulatory Visit: Payer: Self-pay | Admitting: *Deleted

## 2020-09-05 ENCOUNTER — Ambulatory Visit (INDEPENDENT_AMBULATORY_CARE_PROVIDER_SITE_OTHER)
Admission: RE | Admit: 2020-09-05 | Discharge: 2020-09-05 | Disposition: A | Discharge status: Home / Self Care | Payer: Medicare Other | Source: Ambulatory Visit | Attending: Vascular Surgery | Admitting: Vascular Surgery

## 2020-09-05 ENCOUNTER — Ambulatory Visit (HOSPITAL_COMMUNITY)
Admission: RE | Admit: 2020-09-05 | Discharge: 2020-09-05 | Disposition: A | Discharge status: Home / Self Care | Payer: Medicare Other | Source: Ambulatory Visit | Attending: Vascular Surgery | Admitting: Vascular Surgery

## 2020-09-05 VITALS — BP 123/56 | HR 63 | Temp 97.5°F | Resp 20 | Ht <= 58 in | Wt 159.0 lb

## 2020-09-05 DIAGNOSIS — Z992 Dependence on renal dialysis: Secondary | ICD-10-CM | POA: Insufficient documentation

## 2020-09-05 DIAGNOSIS — N186 End stage renal disease: Secondary | ICD-10-CM | POA: Diagnosis not present

## 2020-09-05 NOTE — Progress Notes (Signed)
ASSESSMENT & PLAN:  75 y.o. female with ESRD in need of permanent HD access. She underwent brachio-basilic AVF last winter, but this has not matured. She prefers left sided access. I offered her left upper extremity arteriovenous grafting with next available appointment. She prefers 09/11/20.   CHIEF COMPLAINT:   Needs HD access  HISTORY:  HISTORY OF PRESENT ILLNESS: [name] is a 75 y.o. female with end-stage renal disease who previously underwent a left upper extremity first and second stage brachiobasilic AV fistula creation in winter 2020/2021. Only this has not matured. She is currently dialyzing through a left IJ catheter. She is right-handed. She prefers left-sided access.  Past Medical History:  Diagnosis Date  . Blood transfusion without reported diagnosis   . CAD (coronary artery disease)    Nonobstructive at cardiac catheterization 2000  . Cataract   . Cervical cancer ([place]) 1978  . CHF (congestive heart failure) ([place])   . Chronic back pain   . Chronic kidney disease    MWF dialysis  . Class 2 obesity with body mass index (BMI) of 35 to 39.9 without comorbidity   . COPD (chronic obstructive pulmonary disease) ([place])   . Degenerative disc disease   . DM (diabetes mellitus), type 2 with renal complications ([place])   . Dyslipidemia   . Dysrhythmia    a-fib  . ESRD (end stage renal disease) on dialysis ([place])   . Essential hypertension   . Essential hypertension, benign   . GERD (gastroesophageal reflux disease)   . Gout   . Hiatal hernia 07/27/2013  . History of diverticulitis of colon   . History of hiatal hernia   . Iron deficiency anemia   . Irritable bowel syndrome   . Lumbar radiculopathy   . Mixed hyperlipidemia   . Moderate major depression, single episode ([place]) 07/21/2019  . Neuropathy   . OSA (obstructive sleep apnea)   . Osteoporosis   . Ovarian cancer [facility]) 1978   patient denies. States this was cervical cancer  . Oxygen deficiency    room  air  . PAF (paroxysmal atrial fibrillation) ([place])   . Sleep apnea   . Type 2 diabetes mellitus ([place])   . Vitamin B deficiency 12/25/2009  . Vitamin B12 deficiency     Past Surgical History:  Procedure Laterality Date  . ABDOMINAL HYSTERECTOMY    . AV FISTULA PLACEMENT Left 08/05/2019   Procedure: ARTERIOVENOUS (AV) FISTULA CREATION LEFT ARM;  Surgeon: Waynetta Sandy, MD;  Location: [place];  Service: Vascular;  Laterality: Left;  . BACK SURGERY    . BASCILIC VEIN TRANSPOSITION Left 10/05/2019   Procedure: SECOND STAGE LEFT BASCILIC VEIN TRANSPOSITION;  Surgeon: Waynetta Sandy, MD;  Location: [place];  Service: Vascular;  Laterality: Left;  . Benign breast cysts    . CHOLECYSTECTOMY    . COLONOSCOPY  10/01/2006   SLF:Pan colonic diverticulosis and moderate internal hemorrhoids/ Otherwise no polyps, masses, inflammatory changes or AVMs/  . COLONOSCOPY  2011   SLF: pancolonic diverticulosis, large internal hemorrhoids  . COLONOSCOPY N/A 01/26/2016   Procedure: COLONOSCOPY;  Surgeon: [name], MD;  [facility];  Service: Endoscopy;  Laterality: N/A;  830   . COLONOSCOPY WITH PROPOFOL N/A 02/10/2020   Procedure: COLONOSCOPY WITH PROPOFOL;  Surgeon: Daneil Dolin, MD;  [facility];  Service: Endoscopy;  Laterality: N/A;  10:45am  . ESOPHAGOGASTRODUODENOSCOPY  11/19/2006   SLF: Large hiatal hernia without evidence of Cameron ulcers/.  Distal esophageal stricture, which allowed the gastroscope to pass without resistance.  A 16 mm Savary later passed with mild resistance/ Normal stomach.sb bx negative  . ESOPHAGOGASTRODUODENOSCOPY  10/01/2006   YBO:[id number] hiatal hernia.  Distal esophagus without evidence of   erythema, ulceration or Barrett's esophagus  . ESOPHAGOGASTRODUODENOSCOPY  2011   SLF: large hh, distal esophageal web narrowing to 6mm s/p dilation to 56mm  . ESOPHAGOGASTRODUODENOSCOPY N/A 08/06/2013   SLF: 1. Stricture at the gastroesophageal  junction 2. large hiatal hernia. 3. Mild erosive gastritis.  Marland Kitchen ESOPHAGOGASTRODUODENOSCOPY (EGD) WITH PROPOFOL N/A 07/19/2019   rourk: Mild erosive reflux esophagitis.  Mild Schatzki ring status post dilation.  Large hiatal hernia with at least one half of the stomach above the diaphragm.  Gastric mucosa erythematous.  Marland Kitchen GIVENS CAPSULE STUDY N/A 08/06/2013   INCOMPLETE-SMALL BOWLE ULCERS  . IR FLUORO GUIDE CV LINE LEFT  07/29/2019  . IR US GUIDE VASC ACCESS LEFT  07/29/2019  . KNEE SURGERY Right   . PARTIAL HYSTERECTOMY  1978  . small bowel capsule  2008   negative  . SPINE SURGERY    . TONSILLECTOMY AND ADENOIDECTOMY    . Two back surgeries/fusion    . UMBILICAL HERNIA REPAIR  2010    Family History  Problem Relation Age of Onset  . Colon cancer Brother        diagnosed age 13. Living.   Marland Kitchen Ulcers Sister   . Diabetes Sister   . Heart attack Sister   . Kidney failure Sister   . Stroke Sister   . Ulcers Mother   . Diabetes Mother   . Heart attack Mother   . Stroke Mother   . Asthma Mother   . Heart disease Mother   . Cervical cancer Mother   . Heart attack Brother   . Heart disease Brother   . Asthma Sister   . Diabetes Brother   . Stroke Maternal Grandmother   . Heart attack Maternal Grandmother   . Heart attack Other   . Early death Father        MVA in his 45s    Social History   Socioeconomic History  . Marital status: Widowed    Spouse name: Mendia  . Number of children: 4  . Years of education: Not on file  . Highest education level: 10th grade  Occupational History  . 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 Occupation: retired  Tobacco Use  . Smoking status: Former Smoker    Packs/day: 1.00    Years: 1.00    Pack years: 1.00    Types: Cigarettes    Start date: 02/19/1961    Quit date: 08/05/1961    Years since quitting: 59.1  . Smokeless tobacco: Never Used  . Tobacco comment: 1 year in her lifetime  Vaping Use  . Vaping Use: Never  used  Substance and Sexual Activity  . Alcohol use: Never  . Drug use: Never  . Sexual activity: Not Currently  Other Topics Concern  . Not on file  Social History Narrative   ** Merged History Encounter **       HAS 4 SON-GRAND KIDS AND GREAT GRAND KIDS. Lives in home with [place] - married 12 Y Cook, sew, quilt, crochet   Social Determinants of Health   Financial Resource Strain: Not on file  Food Insecurity: Not on file  Transportation Needs: Not on file  Physical Activity: Not on file  Stress: Not  on file  Social Connections: Not on file  Intimate Partner Violence: Not on file    No Known Allergies  Current Outpatient Medications  Medication Sig Dispense Refill  . acetaminophen (TYLENOL) 325 MG tablet Take 650 mg by mouth daily as needed for headache (pain).    Marland Kitchen albuterol (PROVENTIL) (2.5 MG/3ML) 0.083% nebulizer solution Take 2.5 mg by nebulization 4 (four) times daily as needed for wheezing or shortness of breath.     Marland Kitchen albuterol (VENTOLIN HFA) 108 (90 Base) MCG/ACT inhaler Inhale 2 puffs into the lungs every 6 (six) hours as needed for wheezing or shortness of breath.     . allopurinol (ZYLOPRIM) 100 MG tablet Take 1 tablet (100 mg total) by mouth 2 (two) times daily.    Marland Kitchen amiodarone (PACERONE) 200 MG tablet Take 1 tablet (200 mg total) by mouth daily.    Marland Kitchen apixaban (ELIQUIS) 2.5 MG TABS tablet Take 2.5 mg by mouth 2 (two) times daily.    Marland Kitchen aspirin EC 81 MG tablet Take 81 mg by mouth daily.    Marland Kitchen atorvastatin (LIPITOR) 40 MG tablet Take 1 tablet (40 mg total) by mouth at bedtime. 90 tablet 1  . Biotin 5000 MCG TABS Take 5,000 mcg by mouth daily.    . Carboxymethylcellul-Glycerin (CLEAR EYES FOR DRY EYES) 1-0.25 % SOLN Place 1 drop into both eyes daily as needed (dry eys).    Marland Kitchen diclofenac Sodium (VOLTAREN) 1 % GEL Apply 2 g topically 4 (four) times daily.    Marland Kitchen esomeprazole (NEXIUM) 40 MG capsule Take 40 mg by mouth daily.    . famotidine (PEPCID) 40 MG tablet TAKE 1  TABLET AT BEDTIME AS NEEDED TO CONTROL REFLUX (Patient taking differently: Take 40 mg by mouth at bedtime as needed for heartburn.) 90 tablet 1  . ferric citrate (AURYXIA) 1 GM 210 MG(Fe) tablet Take 210 mg by mouth 2 (two) times daily with a meal.    . Fluticasone-Umeclidin-Vilant (TRELEGY ELLIPTA) 100-62.5-25 MCG/INH AEPB Inhale 1 puff into the lungs daily as needed (shortness of breath/wheezing).    . gabapentin (NEURONTIN) 100 MG capsule Take 100 mg by mouth at bedtime.    . insulin lispro (HUMALOG) 100 UNIT/ML injection Inject 5-16 Units into the skin 3 (three) times daily as needed (greater than 120).     Marland Kitchen LANTUS SOLOSTAR 100 UNIT/ML SOPN Inject 10 Units into the skin daily as needed (over 120 blood sugars).    . lidocaine (LIDODERM) 5 % Place 1 patch onto the skin daily. Remove & Discard patch within 12 hours or as directed by MD (Patient taking differently: Place 1 patch onto the skin daily as needed (pain.). Remove & Discard patch within 12 hours or as directed by MD) 30 patch 0  . midodrine (PROAMATINE) 10 MG tablet Take 1 tablet (10 mg total) by mouth 3 (three) times daily with meals.    . nitroGLYCERIN (NITROSTAT) 0.4 MG SL tablet Place 0.4 mg under the tongue every 5 (five) minutes x 3 doses as needed for chest pain.    Marland Kitchen omeprazole (PRILOSEC) 40 MG capsule Take 40 mg by mouth daily.    . ondansetron (ZOFRAN) 4 MG tablet Take 4 mg by mouth daily as needed for nausea/vomiting.    . OXYGEN Inhale 2 L into the lungs as needed (shortness of breath).    Marland Kitchen PRESCRIPTION MEDICATION Inhale into the lungs at bedtime. CPAP    . tamsulosin (FLOMAX) 0.4 MG CAPS capsule Take 1 capsule (0.4 mg total)  by mouth daily. 30 capsule   . traMADol (ULTRAM) 50 MG tablet Take 1 tablet (50 mg total) by mouth every 8 (eight) hours as needed (pain). 10 tablet 0   No current facility-administered medications for this visit.    REVIEW OF SYSTEMS:  [X]  denotes positive finding, [ ]  denotes negative  finding Cardiac  Comments:  Chest pain or chest pressure:    Shortness of breath upon exertion:    Short of breath when lying flat:    Irregular heart rhythm:        Vascular    Pain in calf, thigh, or hip brought on by ambulation:    Pain in feet at night that wakes you up from your sleep:     Blood clot in your veins:    Leg swelling:         Pulmonary    Oxygen at home:    Productive cough:     Wheezing:         Neurologic    Sudden weakness in arms or legs:     Sudden numbness in arms or legs:     Sudden onset of difficulty speaking or slurred speech:    Temporary loss of vision in one eye:     Problems with dizziness:         Gastrointestinal    Blood in stool:     Vomited blood:         Genitourinary    Burning when urinating:     Blood in urine:        Psychiatric    Major depression:         Hematologic    Bleeding problems:    Problems with blood clotting too easily:        Skin    Rashes or ulcers:        Constitutional    Fever or chills:     PHYSICAL EXAM:   Vitals:   09/05/20 1416  BP: (!) 123/56  Pulse: 63  Resp: 20  Temp: (!) 97.5 F (36.4 C)  SpO2: 97%  Weight: 159 lb (72.1 kg)  Height: 4\' 8"  (1.422 m)   Constitutional: chronically ill appearing in no distress. Appears well nourished.  Neurologic: CN intact. no focal findings. no sensory loss. Psychiatric: Mood and affect symmetric and appropriate. Eyes: No icterus. No conjunctival pallor. Ears, nose, throat: mucous membranes moist. Midline trachea.  Cardiac: regular rate and rhythm.  Respiratory: unlabored. Abdominal: soft, non-tender, non-distended.  Peripheral vascular:  2+ brachial and radial pulse LUE  L brachio-basilic AVF has thrombosed. Extremity: No edema. No cyanosis. No pallor.  Skin: No gangrene. No ulceration.  Lymphatic: No Stemmer's sign. No palpable lymphadenopathy.   DATA REVIEW:    Most recent CBC CBC Latest Ref Rng & Units 07/25/2020 07/24/2020 07/23/2020   WBC 4.0 - 10.5 K/uL 7.0 6.2 4.9  Hemoglobin 12.0 - 15.0 g/dL 12.8 12.6 12.5  Hematocrit 36.0 - 46.0 % 38.5 40.2 39.6  Platelets 150 - 400 K/uL 187 205 191     Most recent CMP CMP Latest Ref Rng & Units 07/25/2020 07/24/2020 07/23/2020  Glucose 70 - 99 mg/dL 220(H) 190(H) 183(H)  BUN 8 - 23 mg/dL 35(H) 49(H) 25(H)  Creatinine 0.44 - 1.00 mg/dL 5.29(H) 7.52(H) 5.77(H)  Sodium 135 - 145 mmol/L 138 139 135  Potassium 3.5 - 5.1 mmol/L 4.6 5.7(H) 5.3(H)  Chloride 98 - 111 mmol/L 96(L) 100 96(L)  CO2 22 - 32 mmol/L 28 23  27  Calcium 8.9 - 10.3 mg/dL 8.2(L) 7.6(L) 7.5(L)  Total Protein 6.5 - 8.1 g/dL 6.0(L) 5.9(L) 6.1(L)  Total Bilirubin 0.3 - 1.2 mg/dL 0.5 0.7 0.4  Alkaline Phos 38 - 126 U/L 56 48 48  AST 15 - 41 U/L 12(L) 11(L) 13(L)  ALT 0 - 44 U/L 7 7 9     Renal function CrCl cannot be calculated (Patient's most recent lab result is older than the maximum 21 days allowed.).  Hgb A1c MFr Bld (%)  Date Value  04/24/2020 6.0 (H)    LDL Cholesterol (Calc)  Date Value Ref Range Status  10/10/2017 186 (H) mg/dL (calc) Final    Comment:    Reference range: <100 . Desirable range <100 mg/dL for primary prevention;   <70 mg/dL for patients with CHD or diabetic patients  with > or = 2 CHD risk factors. Marland Kitchen LDL-C is now calculated using the Martin-Hopkins  calculation, which is a validated novel method providing  better accuracy than the Friedewald equation in the  estimation of LDL-C.  Cresenciano Genre et al. Annamaria Helling. 8416;606(30): 2061-2068  (http://education.[facility].com/faq/FAQ164)      Vascular Imaging: Preoperative vascular mapping for dialysis access personally reviewed Right basilic vein appears adequate for fistula creation. No other vein appropriate for fistula creation upper extremity  [name]. Stanford Breed, MD [facility] Phone Number: [phone] 09/05/2020 4:54 PM

## 2020-09-05 NOTE — H&P (View-Only) (Signed)
ASSESSMENT & PLAN:  75 y.o. female with ESRD in need of permanent HD access. She underwent brachio-basilic AVF last winter, but this has not matured. She prefers left sided access. I offered her left upper extremity arteriovenous grafting with next available appointment. She prefers 09/11/20.   CHIEF COMPLAINT:   Needs HD access  HISTORY:  HISTORY OF PRESENT ILLNESS: [name] is a 75 y.o. female with end-stage renal disease who previously underwent a left upper extremity first and second stage brachiobasilic AV fistula creation in winter 2020/2021. Only this has not matured. She is currently dialyzing through a left IJ catheter. She is right-handed. She prefers left-sided access.  Past Medical History:  Diagnosis Date  . Blood transfusion without reported diagnosis   . CAD (coronary artery disease)    Nonobstructive at cardiac catheterization 2000  . Cataract   . Cervical cancer ([place]) 1978  . CHF (congestive heart failure) ([place])   . Chronic back pain   . Chronic kidney disease    MWF dialysis  . Class 2 obesity with body mass index (BMI) of 35 to 39.9 without comorbidity   . COPD (chronic obstructive pulmonary disease) ([place])   . Degenerative disc disease   . DM (diabetes mellitus), type 2 with renal complications ([place])   . Dyslipidemia   . Dysrhythmia    a-fib  . ESRD (end stage renal disease) on dialysis ([place])   . Essential hypertension   . Essential hypertension, benign   . GERD (gastroesophageal reflux disease)   . Gout   . Hiatal hernia 07/27/2013  . History of diverticulitis of colon   . History of hiatal hernia   . Iron deficiency anemia   . Irritable bowel syndrome   . Lumbar radiculopathy   . Mixed hyperlipidemia   . Moderate major depression, single episode ([place]) 07/21/2019  . Neuropathy   . OSA (obstructive sleep apnea)   . Osteoporosis   . Ovarian cancer [facility]) 1978   patient denies. States this was cervical cancer  . Oxygen deficiency    room  air  . PAF (paroxysmal atrial fibrillation) ([place])   . Sleep apnea   . Type 2 diabetes mellitus ([place])   . Vitamin B deficiency 12/25/2009  . Vitamin B12 deficiency     Past Surgical History:  Procedure Laterality Date  . ABDOMINAL HYSTERECTOMY    . AV FISTULA PLACEMENT Left 08/05/2019   Procedure: ARTERIOVENOUS (AV) FISTULA CREATION LEFT ARM;  Surgeon: Waynetta Sandy, MD;  Location: [facility];  Service: Vascular;  Laterality: Left;  . BACK SURGERY    . BASCILIC VEIN TRANSPOSITION Left 10/05/2019   Procedure: SECOND STAGE LEFT BASCILIC VEIN TRANSPOSITION;  Surgeon: Waynetta Sandy, MD;  Location: [facility];  Service: Vascular;  Laterality: Left;  . Benign breast cysts    . CHOLECYSTECTOMY    . COLONOSCOPY  10/01/2006   SLF:Pan colonic diverticulosis and moderate internal hemorrhoids/ Otherwise no polyps, masses, inflammatory changes or AVMs/  . COLONOSCOPY  2011   SLF: pancolonic diverticulosis, large internal hemorrhoids  . COLONOSCOPY N/A 01/26/2016   Procedure: COLONOSCOPY;  Surgeon: [name], MD;  [facility];  Service: Endoscopy;  Laterality: N/A;  830   . COLONOSCOPY WITH PROPOFOL N/A 02/10/2020   Procedure: COLONOSCOPY WITH PROPOFOL;  Surgeon: Daneil Dolin, MD;  [facility];  Service: Endoscopy;  Laterality: N/A;  10:45am  . ESOPHAGOGASTRODUODENOSCOPY  11/19/2006   SLF: Large hiatal hernia without evidence of Cameron ulcers/.  Distal esophageal stricture, which allowed the gastroscope to pass without resistance.  A 16 mm Savary later passed with mild resistance/ Normal stomach.sb bx negative  . ESOPHAGOGASTRODUODENOSCOPY  10/01/2006   YIR:SWNIO hiatal hernia.  Distal esophagus without evidence of   erythema, ulceration or Barrett's esophagus  . ESOPHAGOGASTRODUODENOSCOPY  2011   SLF: large hh, distal esophageal web narrowing to 88mm s/p dilation to 25mm  . ESOPHAGOGASTRODUODENOSCOPY N/A 08/06/2013   SLF: 1. Stricture at the gastroesophageal  junction 2. large hiatal hernia. 3. Mild erosive gastritis.  Marland Kitchen ESOPHAGOGASTRODUODENOSCOPY (EGD) WITH PROPOFOL N/A 07/19/2019   rourk: Mild erosive reflux esophagitis.  Mild Schatzki ring status post dilation.  Large hiatal hernia with at least one half of the stomach above the diaphragm.  Gastric mucosa erythematous.  Marland Kitchen GIVENS CAPSULE STUDY N/A 08/06/2013   INCOMPLETE-SMALL BOWLE ULCERS  . IR FLUORO GUIDE CV LINE LEFT  07/29/2019  . IR US GUIDE VASC ACCESS LEFT  07/29/2019  . KNEE SURGERY Right   . PARTIAL HYSTERECTOMY  1978  . small bowel capsule  2008   negative  . SPINE SURGERY    . TONSILLECTOMY AND ADENOIDECTOMY    . Two back surgeries/fusion    . UMBILICAL HERNIA REPAIR  2010    Family History  Problem Relation Age of Onset  . Colon cancer Brother        diagnosed age 37. Living.   Marland Kitchen Ulcers Sister   . Diabetes Sister   . Heart attack Sister   . Kidney failure Sister   . Stroke Sister   . Ulcers Mother   . Diabetes Mother   . Heart attack Mother   . Stroke Mother   . Asthma Mother   . Heart disease Mother   . Cervical cancer Mother   . Heart attack Brother   . Heart disease Brother   . Asthma Sister   . Diabetes Brother   . Stroke Maternal Grandmother   . Heart attack Maternal Grandmother   . Heart attack Other   . Early death Father        MVA in his 105s    Social History   Socioeconomic History  . Marital status: Widowed    Spouse name: Trim  . Number of children: 4  . Years of education: Not on file  . Highest education level: 10th grade  Occupational History  . Occupation: retired    Comment: Ambulance person, [employer] work  . Occupation: retired  Tobacco Use  . Smoking status: Former Smoker    Packs/day: 1.00    Years: 1.00    Pack years: 1.00    Types: Cigarettes    Start date: 02/19/1961    Quit date: 08/05/1961    Years since quitting: 59.1  . Smokeless tobacco: Never Used  . Tobacco comment: 1 year in her lifetime  Vaping Use  . Vaping Use: Never  used  Substance and Sexual Activity  . Alcohol use: Never  . Drug use: Never  . Sexual activity: Not Currently  Other Topics Concern  . Not on file  Social History Narrative   ** Merged History Encounter **       HAS 4 SON-GRAND KIDS AND GREAT GRAND KIDS. Lives in home with Hakim - married 13 Y Cook, sew, quilt, crochet   Social Determinants of Health   Financial Resource Strain: Not on file  Food Insecurity: Not on file  Transportation Needs: Not on file  Physical Activity: Not on file  Stress: Not  on file  Social Connections: Not on file  Intimate Partner Violence: Not on file    No Known Allergies  Current Outpatient Medications  Medication Sig Dispense Refill  . acetaminophen (TYLENOL) 325 MG tablet Take 650 mg by mouth daily as needed for headache (pain).    Marland Kitchen albuterol (PROVENTIL) (2.5 MG/3ML) 0.083% nebulizer solution Take 2.5 mg by nebulization 4 (four) times daily as needed for wheezing or shortness of breath.     Marland Kitchen albuterol (VENTOLIN HFA) 108 (90 Base) MCG/ACT inhaler Inhale 2 puffs into the lungs every 6 (six) hours as needed for wheezing or shortness of breath.     . allopurinol (ZYLOPRIM) 100 MG tablet Take 1 tablet (100 mg total) by mouth 2 (two) times daily.    Marland Kitchen amiodarone (PACERONE) 200 MG tablet Take 1 tablet (200 mg total) by mouth daily.    Marland Kitchen apixaban (ELIQUIS) 2.5 MG TABS tablet Take 2.5 mg by mouth 2 (two) times daily.    Marland Kitchen aspirin EC 81 MG tablet Take 81 mg by mouth daily.    Marland Kitchen atorvastatin (LIPITOR) 40 MG tablet Take 1 tablet (40 mg total) by mouth at bedtime. 90 tablet 1  . Biotin 5000 MCG TABS Take 5,000 mcg by mouth daily.    . Carboxymethylcellul-Glycerin (CLEAR EYES FOR DRY EYES) 1-0.25 % SOLN Place 1 drop into both eyes daily as needed (dry eys).    Marland Kitchen diclofenac Sodium (VOLTAREN) 1 % GEL Apply 2 g topically 4 (four) times daily.    Marland Kitchen esomeprazole (NEXIUM) 40 MG capsule Take 40 mg by mouth daily.    . famotidine (PEPCID) 40 MG tablet TAKE 1  TABLET AT BEDTIME AS NEEDED TO CONTROL REFLUX (Patient taking differently: Take 40 mg by mouth at bedtime as needed for heartburn.) 90 tablet 1  . ferric citrate (AURYXIA) 1 GM 210 MG(Fe) tablet Take 210 mg by mouth 2 (two) times daily with a meal.    . Fluticasone-Umeclidin-Vilant (TRELEGY ELLIPTA) 100-62.5-25 MCG/INH AEPB Inhale 1 puff into the lungs daily as needed (shortness of breath/wheezing).    . gabapentin (NEURONTIN) 100 MG capsule Take 100 mg by mouth at bedtime.    . insulin lispro (HUMALOG) 100 UNIT/ML injection Inject 5-16 Units into the skin 3 (three) times daily as needed (greater than 120).     Marland Kitchen LANTUS SOLOSTAR 100 UNIT/ML SOPN Inject 10 Units into the skin daily as needed (over 120 blood sugars).    . lidocaine (LIDODERM) 5 % Place 1 patch onto the skin daily. Remove & Discard patch within 12 hours or as directed by MD (Patient taking differently: Place 1 patch onto the skin daily as needed (pain.). Remove & Discard patch within 12 hours or as directed by MD) 30 patch 0  . midodrine (PROAMATINE) 10 MG tablet Take 1 tablet (10 mg total) by mouth 3 (three) times daily with meals.    . nitroGLYCERIN (NITROSTAT) 0.4 MG SL tablet Place 0.4 mg under the tongue every 5 (five) minutes x 3 doses as needed for chest pain.    Marland Kitchen omeprazole (PRILOSEC) 40 MG capsule Take 40 mg by mouth daily.    . ondansetron (ZOFRAN) 4 MG tablet Take 4 mg by mouth daily as needed for nausea/vomiting.    . OXYGEN Inhale 2 L into the lungs as needed (shortness of breath).    Marland Kitchen PRESCRIPTION MEDICATION Inhale into the lungs at bedtime. CPAP    . tamsulosin (FLOMAX) 0.4 MG CAPS capsule Take 1 capsule (0.4 mg total)  by mouth daily. 30 capsule   . traMADol (ULTRAM) 50 MG tablet Take 1 tablet (50 mg total) by mouth every 8 (eight) hours as needed (pain). 10 tablet 0   No current facility-administered medications for this visit.    REVIEW OF SYSTEMS:  [X]  denotes positive finding, [ ]  denotes negative  finding Cardiac  Comments:  Chest pain or chest pressure:    Shortness of breath upon exertion:    Short of breath when lying flat:    Irregular heart rhythm:        Vascular    Pain in calf, thigh, or hip brought on by ambulation:    Pain in feet at night that wakes you up from your sleep:     Blood clot in your veins:    Leg swelling:         Pulmonary    Oxygen at home:    Productive cough:     Wheezing:         Neurologic    Sudden weakness in arms or legs:     Sudden numbness in arms or legs:     Sudden onset of difficulty speaking or slurred speech:    Temporary loss of vision in one eye:     Problems with dizziness:         Gastrointestinal    Blood in stool:     Vomited blood:         Genitourinary    Burning when urinating:     Blood in urine:        Psychiatric    Major depression:         Hematologic    Bleeding problems:    Problems with blood clotting too easily:        Skin    Rashes or ulcers:        Constitutional    Fever or chills:     PHYSICAL EXAM:   Vitals:   09/05/20 1416  BP: (!) 123/56  Pulse: 63  Resp: 20  Temp: (!) 97.5 F (36.4 C)  SpO2: 97%  Weight: 159 lb (72.1 kg)  Height: 4\' 8"  (1.422 m)   Constitutional: chronically ill appearing in no distress. Appears well nourished.  Neurologic: CN intact. no focal findings. no sensory loss. Psychiatric: Mood and affect symmetric and appropriate. Eyes: No icterus. No conjunctival pallor. Ears, nose, throat: mucous membranes moist. Midline trachea.  Cardiac: regular rate and rhythm.  Respiratory: unlabored. Abdominal: soft, non-tender, non-distended.  Peripheral vascular:  2+ brachial and radial pulse LUE  L brachio-basilic AVF has thrombosed. Extremity: No edema. No cyanosis. No pallor.  Skin: No gangrene. No ulceration.  Lymphatic: No Stemmer's sign. No palpable lymphadenopathy.   DATA REVIEW:    Most recent CBC CBC Latest Ref Rng & Units 07/25/2020 07/24/2020 07/23/2020   WBC 4.0 - 10.5 K/uL 7.0 6.2 4.9  Hemoglobin 12.0 - 15.0 g/dL 12.8 12.6 12.5  Hematocrit 36.0 - 46.0 % 38.5 40.2 39.6  Platelets 150 - 400 K/uL 187 205 191     Most recent CMP CMP Latest Ref Rng & Units 07/25/2020 07/24/2020 07/23/2020  Glucose 70 - 99 mg/dL 220(H) 190(H) 183(H)  BUN 8 - 23 mg/dL 35(H) 49(H) 25(H)  Creatinine 0.44 - 1.00 mg/dL 5.29(H) 7.52(H) 5.77(H)  Sodium 135 - 145 mmol/L 138 139 135  Potassium 3.5 - 5.1 mmol/L 4.6 5.7(H) 5.3(H)  Chloride 98 - 111 mmol/L 96(L) 100 96(L)  CO2 22 - 32 mmol/L 28 23  27  Calcium 8.9 - 10.3 mg/dL 8.2(L) 7.6(L) 7.5(L)  Total Protein 6.5 - 8.1 g/dL 6.0(L) 5.9(L) 6.1(L)  Total Bilirubin 0.3 - 1.2 mg/dL 0.5 0.7 0.4  Alkaline Phos 38 - 126 U/L 56 48 48  AST 15 - 41 U/L 12(L) 11(L) 13(L)  ALT 0 - 44 U/L 7 7 9     Renal function CrCl cannot be calculated (Patient's most recent lab result is older than the maximum 21 days allowed.).  Hgb A1c MFr Bld (%)  Date Value  04/24/2020 6.0 (H)    LDL Cholesterol (Calc)  Date Value Ref Range Status  10/10/2017 186 (H) mg/dL (calc) Final    Comment:    Reference range: <100 . Desirable range <100 mg/dL for primary prevention;   <70 mg/dL for patients with CHD or diabetic patients  with > or = 2 CHD risk factors. Marland Kitchen LDL-C is now calculated using the Martin-Hopkins  calculation, which is a validated novel method providing  better accuracy than the Friedewald equation in the  estimation of LDL-C.  Cresenciano Genre et al. Annamaria Helling. 8979;150(41): 2061-2068  (http://education.[employer].com/faq/FAQ164)      Vascular Imaging: Preoperative vascular mapping for dialysis access personally reviewed Right basilic vein appears adequate for fistula creation. No other vein appropriate for fistula creation upper extremity  [name]. Stanford Breed, MD [facility] Phone Number: [phone] 09/05/2020 4:54 PM

## 2020-09-07 NOTE — Progress Notes (Signed)
PCP - Catalina Antigua, MD Cardiologist - [name]  PPM/ICD - denies  Chest x-ray - n/a EKG - 07/20/20 Stress Test - 12/10/2012 ECHO - 07/27/2019 Cardiac Cath - denies  CPAP - yes (charted in history) Sleep study: unknown  Pt is diabetic but I was unable to reach her over the phone.    Patient instructed to hold all NSAID's, herbal medications, fish oil and vitamins 7 days prior to surgery. Please stop taking eliquis 3 days prior to surgery. Your last dose should be 09/07/20. Continue taking aspirin through the day of surgery.    ERAS Protcol - no  COVID TEST- Covid + 07/19/20. No covid test needed.   Anesthesia review: yes. Last seen by Dr. Domenic Polite (cardiologist) on 10/13/2018.  Patient verbally denies any shortness of breath, fever, cough and chest pain during phone call   -------------  SDW INSTRUCTIONS given:  Your procedure is scheduled on 09/11/20.  Report to [facility] Main Entrance "A" at 05:30 A.M., and check in at the Admitting office.  Call this number if you have problems the morning of surgery:  [phone]   Remember:  Do not eat or drink after midnight the night before your surgery    Take these medicines the morning of surgery with A SIP OF WATER  acetaminophen (TYLENOL)  albuterol (PROVENTIL)- inhaler and nebulizer (bring inhalers with you the day of surgery) amiodarone (PACERONE) Eye drops esomeprazole (NEXIUM) Fluticasone-Umeclidin-Vilant (TRELEGY ELLIPTA) gabapentin (NEURONTIN) midodrine (PROAMATINE) omeprazole (PRILOSEC)  ondansetron (ZOFRAN) tamsulosin (FLOMAX) traMADol (ULTRAM)   Humalog- do not take the morning of surgery. Lantus- Take 5 units the morning of surgery as well as the evening before surgery if CBG>120.  As of today, STOP taking any  (unless otherwise instructed by your surgeon) Aleve, Naproxen, Ibuprofen, Motrin, Advil, Goody's, BC's, all herbal medications, fish oil, and all vitamins. Stop taking eliquis 3 days prior to  surgery. Your last dose of eliquis should be 09/07/20. Continue taking aspirin through the day of surgery.                       Do not wear jewelry, make up, or nail polish            Do not wear lotions, powders, perfumes/colognes, or deodorant.            Do not shave 48 hours prior to surgery.              Do not bring valuables to the hospital.            [facility] is not responsible for any belongings or valuables.  Do NOT Smoke (Tobacco/Vaping) or drink Alcohol 24 hours prior to your procedure If you use a CPAP at night, you may bring all equipment for your overnight stay.   Contacts, glasses, dentures or bridgework may not be worn into surgery.      For patients admitted to the hospital, discharge time will be determined by your treatment team.   Patients discharged the day of surgery will not be allowed to drive home, and someone needs to stay with them for 24 hours.    Special instructions:   [place]- Preparing For Surgery  Before surgery, you can play an important role. Because skin is not sterile, your skin needs to be as free of germs as possible. You can reduce the number of germs on your skin by washing with CHG (chlorahexidine gluconate) Soap before surgery.  CHG is an  antiseptic cleaner which kills germs and bonds with the skin to continue killing germs even after washing.    Oral Hygiene is also important to reduce your risk of infection.  Remember - BRUSH YOUR TEETH THE MORNING OF SURGERY WITH YOUR REGULAR TOOTHPASTE  Please do not use if you have an allergy to CHG or antibacterial soaps. If your skin becomes reddened/irritated stop using the CHG.  Do not shave (including legs and underarms) for at least 48 hours prior to first CHG shower. It is OK to shave your face.  Please follow these instructions carefully.   1. Shower the NIGHT BEFORE SURGERY and the MORNING OF SURGERY with DIAL Soap.   2. Pat yourself dry with a CLEAN TOWEL.  3. Wear CLEAN PAJAMAS to  bed the night before surgery  4. Place CLEAN SHEETS on your bed the night of your first shower and DO NOT SLEEP WITH PETS.   Day of Surgery: Please shower morning of surgery  Wear Clean/Comfortable clothing the morning of surgery Do not apply any deodorants/lotions.   Remember to brush your teeth WITH YOUR REGULAR TOOTHPASTE.   Questions were answered. Patient verbalized understanding of instructions.

## 2020-09-07 NOTE — Progress Notes (Signed)
Unable to reach patient over the phone for surgery instructions. Left voicemail with medication instruction and time of arrival instructions. Instructed patient to remain NPO after midnight and also shower the night before and morning of surgery.

## 2020-09-08 ENCOUNTER — Encounter (HOSPITAL_COMMUNITY): Payer: Self-pay | Admitting: *Deleted

## 2020-09-08 ENCOUNTER — Telehealth: Payer: Self-pay | Admitting: Gastroenterology

## 2020-09-08 ENCOUNTER — Ambulatory Visit (INDEPENDENT_AMBULATORY_CARE_PROVIDER_SITE_OTHER): Payer: Medicare Other | Admitting: Gastroenterology

## 2020-09-08 ENCOUNTER — Other Ambulatory Visit: Payer: Self-pay

## 2020-09-08 ENCOUNTER — Encounter: Payer: Self-pay | Admitting: Gastroenterology

## 2020-09-08 VITALS — BP 125/58 | HR 86 | Temp 97.6°F | Ht <= 58 in | Wt 158.6 lb

## 2020-09-08 DIAGNOSIS — D500 Iron deficiency anemia secondary to blood loss (chronic): Secondary | ICD-10-CM

## 2020-09-08 DIAGNOSIS — K5732 Diverticulitis of large intestine without perforation or abscess without bleeding: Secondary | ICD-10-CM | POA: Diagnosis not present

## 2020-09-08 MED ORDER — CIPROFLOXACIN HCL 500 MG PO TABS: 500.0000 mg | ORAL_TABLET | Freq: Every day | ORAL | 0 refills | Status: AC

## 2020-09-08 MED ORDER — METRONIDAZOLE 500 MG PO TABS: 500.0000 mg | ORAL_TABLET | Freq: Three times a day (TID) | ORAL | 0 refills | Status: AC

## 2020-09-08 NOTE — Progress Notes (Signed)
Anesthesia Chart Review: Same-day work-up  Follows with cardiologist Dr. Terrence Dupont for history of paroxysmal atrial fibrillation on Eliquis, pulmonary hypertension, HTN.  Last seen 08/28/2020.  Stable at that time, no changes made.  Advised to continue with vascular surgery as scheduled.  Per vascular surgery, patient instructed to hold Eliquis 2 days prior to surgery.  Recently admitted 07/19/2020 through 07/27/2020 for acute on chronic hypoxic respiratory failure due to acute COVID-19 viral pneumonia.  She was noted to have been vaccinated with 2 doses of Moderna.  She also had fluid overloaded and hyperkalemic due to missed dialysis x2.  She was treated with IV remdesivir and Solu-Medrol.  Fluid overload and electrolyte abnormalities treated with dialysis.  She was discharged on 2 L O2 via nasal cannula intermittently, which was noted to be her baseline.  History of COPD maintained on Trelegy Ellipta and as needed albuterol.  OSA on CPAP.  IDDM 2, last A1c in epic was 6.0 on 04/24/2020.  ESRD on HD currently dialyzing through left IJ catheter on Monday Wednesday Friday.  She will need day of surgery labs and evaluation.  TTE 12/31/2019 (copy on chart): Summary: Normal left ventricular size.  Left ventricular systolic function was normal with normal ejection fraction of 55%. The left ventricle shows normal contractility and size. Left ventricular hypertrophy is seen with normal diastolic function noted. Mild mitral regurgitation is seen. Trace pulmonic regurgitation is present. Moderate tricuspid regurgitation is seen.  RVSP estimated at 60 mmHg with moderate pulmonary hypertension. No intracardiac shunting is found. No intracardiac masses or thrombi are seen. No pericardial effusion is seen with no suggestion of pericardial tamponade.   Wynonia Musty [facility] Short Stay Center/Anesthesiology Phone [phone] 09/08/2020 3:53 PM

## 2020-09-08 NOTE — Telephone Encounter (Signed)
After patient left the office, I realized that she has both omeprazole and esomeprazole (Nexium) listed on her medication list.  According to the reconciliation, she is receiving both actively from the pharmacy.  Please call patient and find out if she is indeed taking both omeprazole and esomeprazole (Nexium).  She should not need both of these medications as they are both in the same drug class.

## 2020-09-08 NOTE — Progress Notes (Signed)
Primary Care Physician: [name], MD  Primary Gastroenterologist:  Elon Alas. Abbey Chatters, DO   Chief Complaint  Patient presents with  . Anemia  . Weight Loss    "I don't have any idea"    HPI: [name] is a 75 y.o. female here for follow-up.  She was seen back in March.  History of A. fib on Eliquis, COPD, diastolic heart failure, end-stage renal disease on hemodialysis, morbid obesity, gout.  History of large hiatal hernia with most of her stomach in her chest on prior CT.  History of Cameron ulcers and peptic stricture in the past requiring esophageal dilation.  Colonoscopy in 2017 with pancolonic diverticulosis and tubular adenomas removed.  History of incomplete study of the small bowel in 2015 (capsule never reached the cecum) with evidence of small bowel ulcers.  EGD during admission on 07/19/2019 with mild erosive reflux esophagitis, mild Schatzki ring status post dilation, large hiatal hernia, erythematous gastric mucosa. She is advised to avoid all NSAIDs.  She completed colonoscopy in July 2021.  She was found to have diverticulosis.  No future colonoscopy is advised.  Patient presents for follow up today.  She states that her nephrologist is concerned that she has anemia again.  They're concerned she is losing blood somewhere.  She gets a "shot" during dialysis 3 times weekly.  She believes it is iron.  Denies any melena but stools are dark on oral iron.  No bright red blood per rectum.  She complains of nausea, no appetite. Gas all the time. Intermittent LLQ pain going on for 5-6 weeks. Can go days without pain. Eating makes it worse. Not better after BM. Passing mucous when trying to have BM. Passing BM once per day which is normal. No significant heartburn.    She weighed 208 pounds November 2020, January 2021 down to 173 pounds, September 09 2019 167 pounds.  September 2021 166 pounds. December 2021 159 pounds.  Today's weight 158.6 pounds. No weight loss in  two months.  Patient was in the hospital with Covid pneumonia back in December.  She remains on Eliquis 2.5 mg twice daily.  Also on aspirin 81 mg daily.  Current Outpatient Medications  Medication Sig Dispense Refill  . allopurinol (ZYLOPRIM) 100 MG tablet Take 1 tablet (100 mg total) by mouth 2 (two) times daily. (Patient taking differently: Take 100 mg by mouth at bedtime.)    . amiodarone (PACERONE) 200 MG tablet Take 1 tablet (200 mg total) by mouth daily.    Marland Kitchen apixaban (ELIQUIS) 2.5 MG TABS tablet Take 2.5 mg by mouth 2 (two) times daily.    Marland Kitchen aspirin EC 81 MG tablet Take 81 mg by mouth daily.    Marland Kitchen atorvastatin (LIPITOR) 40 MG tablet Take 1 tablet (40 mg total) by mouth at bedtime. 90 tablet 1  . Biotin 5000 MCG TABS Take 5,000 mcg by mouth daily.    . Carboxymethylcellul-Glycerin (CLEAR EYES FOR DRY EYES) 1-0.25 % SOLN Place 1 drop into both eyes daily as needed (dry eys).    . cholecalciferol (VITAMIN D) 25 MCG (1000 UNIT) tablet Take 1,000 Units by mouth daily.    Marland Kitchen esomeprazole (NEXIUM) 40 MG capsule Take 40 mg by mouth daily.    . famotidine (PEPCID) 40 MG tablet TAKE 1 TABLET AT BEDTIME AS NEEDED TO CONTROL REFLUX (Patient taking differently: Take 40 mg by mouth at bedtime.) 90 tablet 1  . ferric citrate (AURYXIA) 1 GM 210 MG(Fe)  tablet Take 210 mg by mouth 2 (two) times daily with a meal.    . Fluticasone-Umeclidin-Vilant (TRELEGY ELLIPTA) 100-62.5-25 MCG/INH AEPB Inhale 1 puff into the lungs daily.    Marland Kitchen gabapentin (NEURONTIN) 100 MG capsule Take 100 mg by mouth at bedtime.    . insulin lispro (HUMALOG) 100 UNIT/ML injection Inject 5-16 Units into the skin 3 (three) times daily as needed (greater than 120).     Marland Kitchen LANTUS SOLOSTAR 100 UNIT/ML SOPN Inject 10 Units into the skin daily as needed (over 120 blood sugars).    . lidocaine (LIDODERM) 5 % Place 1 patch onto the skin daily. Remove & Discard patch within 12 hours or as directed by MD (Patient taking differently: Place 1 patch  onto the skin daily as needed (pain.). Remove & Discard patch within 12 hours or as directed by MD) 30 patch 0  . midodrine (PROAMATINE) 10 MG tablet Take 1 tablet (10 mg total) by mouth 3 (three) times daily with meals.    . nitroGLYCERIN (NITROSTAT) 0.4 MG SL tablet Place 0.4 mg under the tongue every 5 (five) minutes x 3 doses as needed for chest pain.    Marland Kitchen omeprazole (PRILOSEC) 40 MG capsule Take 40 mg by mouth daily.    . ondansetron (ZOFRAN) 4 MG tablet Take 4 mg by mouth every 8 (eight) hours as needed for nausea/vomiting, nausea or vomiting.    . OXYGEN Inhale 2 L into the lungs See admin instructions. Use every night and as needed during the day    . PRESCRIPTION MEDICATION Inhale into the lungs at bedtime. Bipap    . tamsulosin (FLOMAX) 0.4 MG CAPS capsule Take 1 capsule (0.4 mg total) by mouth daily. 30 capsule   . acetaminophen (TYLENOL) 325 MG tablet Take 650 mg by mouth daily as needed for headache (pain).    Marland Kitchen albuterol (PROVENTIL) (2.5 MG/3ML) 0.083% nebulizer solution Take 2.5 mg by nebulization 4 (four) times daily as needed for wheezing or shortness of breath.     Marland Kitchen albuterol (VENTOLIN HFA) 108 (90 Base) MCG/ACT inhaler Inhale 2 puffs into the lungs every 6 (six) hours as needed for wheezing or shortness of breath.      No current facility-administered medications for this visit.    Allergies as of 09/08/2020  . (No Known Allergies)    ROS:  General: Negative for anorexia, weight loss, fever, chills, fatigue, positive weakness. ENT: Negative for hoarseness, difficulty swallowing , nasal congestion. CV: Negative for chest pain, angina, palpitations, dyspnea on exertion, peripheral edema.  Respiratory: Negative for dyspnea at rest, dyspnea on exertion, cough, sputum, wheezing.  GI: See history of present illness. GU:  Negative for dysuria, hematuria, urinary incontinence, urinary frequency, nocturnal urination.  Endo: Negative for unusual weight change.    Physical  Examination:   BP (!) 125/58   Pulse 86   Temp 97.6 F (36.4 C)   Ht 4\' 8"  (1.422 m)   Wt 158 lb 9.6 oz (71.9 kg)   LMP  (LMP Unknown)   BMI 35.56 kg/m   General: Chronically ill-appearing female in no acute distress.    Eyes: No icterus. Mouth: masked. Lungs: Clear to auscultation bilaterally.  Heart: Regular rate and rhythm, no murmurs rubs or gallops.  Abdomen: Bowel sounds are normal,  nondistended, no hepatosplenomegaly or masses, no abdominal bruits or hernia , no rebound or guarding.  Moderate left lower quadrant tenderness Extremities: No lower extremity edema. No clubbing or deformities. Neuro: Alert and oriented x 4  Skin: Warm and dry, no jaundice.   Psych: Alert and cooperative, normal mood and affect.  Labs:  Lab Results  Component Value Date   CREATININE 5.29 (H) 07/25/2020   BUN 35 (H) 07/25/2020   NA 138 07/25/2020   K 4.6 07/25/2020   CL 96 (L) 07/25/2020   CO2 28 07/25/2020   Lab Results  Component Value Date   ALT 7 07/25/2020   AST 12 (L) 07/25/2020   ALKPHOS 56 07/25/2020   BILITOT 0.5 07/25/2020   Lab Results  Component Value Date   WBC 7.0 07/25/2020   HGB 12.8 07/25/2020   HCT 38.5 07/25/2020   MCV 91.7 07/25/2020   PLT 187 07/25/2020      Imaging Studies: VAS [place] UPPER EXTREMITY ARTERIAL DUPLEX  Result Date: 09/06/2020 UPPER EXTREMITY DUPLEX STUDY Indications: Pre-operative exam.  Other Factors: History of left Basilic vein transposition Limitations: Exam performed in [facility] Performing Technologist: Alvia Grove RVT  Examination Guidelines: A complete evaluation includes B-mode imaging, spectral Doppler, color Doppler, and power Doppler as needed of all accessible portions of each vessel. Bilateral testing is considered an integral part of a complete examination. Limited examinations for reoccurring indications may be performed as noted.  Right Pre-Dialysis Findings:  +----------------------+---------+-------------------+---------+---------------+ Location              PSV      Intralum. Diam.    Waveform Comments                              (cm/s)   (cm)                                        +----------------------+---------+-------------------+---------+---------------+ Brachial Antecub.     120      0.33               triphasicsounds          fossa                                                      triphasic       +----------------------+---------+-------------------+---------+---------------+ Radial Art at Wrist   77       0.20               triphasicsounds                                                                     triphasic       +----------------------+---------+-------------------+---------+---------------+ Ulnar Art at Wrist    66       0.14               biphasic                 +----------------------+---------+-------------------+---------+---------------+  Left Pre-Dialysis Findings: +----------------+----------+-----------------+----------+---------------------+ Location        PSV (cm/s)Intralum. DiamTressia Danas  Comments                                        (  cm)                                             +----------------+----------+-----------------+----------+---------------------+ Brachial        115       0.34             biphasic  Basilic vein          Antecub. fossa                                       transposition         +----------------+----------+-----------------+----------+---------------------+ Radial Art at   69        0.12             monophasic                      Wrist                                                                      +----------------+----------+-----------------+----------+---------------------+ Ulnar Art at    26        0.13             monophasic                      Wrist                                                                       +----------------+----------+-----------------+----------+---------------------+  Summary:   Measurements above. *See table(s) above for measurements and observations. Electronically signed by Harold Barban MD on 09/06/2020 at 7:20:30 PM.    Final    VAS [place] UPPER EXTREMITY VEIN MAPPING  Result Date: 09/05/2020 UPPER EXTREMITY VEIN MAPPING  Indications: Pre-access. History: History of left Basilic vein transposition.  Limitations: Exam performed in [facility] Performing Technologist: Alvia Grove RVT  Examination Guidelines: A complete evaluation includes B-mode imaging, spectral Doppler, color Doppler, and power Doppler as needed of all accessible portions of each vessel. Bilateral testing is considered an integral part of a complete examination. Limited examinations for reoccurring indications may be performed as noted. +-----------------+-------------+----------+--------------+ Right Cephalic   Diameter (cm)Depth (cm)   Findings    +-----------------+-------------+----------+--------------+ Shoulder             0.18                              +-----------------+-------------+----------+--------------+ Prox upper arm       0.19                              +-----------------+-------------+----------+--------------+ Mid upper arm  not visualized +-----------------+-------------+----------+--------------+ Dist upper arm                          not visualized +-----------------+-------------+----------+--------------+ Antecubital fossa                       not visualized +-----------------+-------------+----------+--------------+ Prox forearm         0.17                              +-----------------+-------------+----------+--------------+ Mid forearm          0.18                              +-----------------+-------------+----------+--------------+ Dist forearm         0.16                               +-----------------+-------------+----------+--------------+ Wrist                                   not visualized +-----------------+-------------+----------+--------------+ +-----------------+-------------+----------+--------------+ Right Basilic    Diameter (cm)Depth (cm)   Findings    +-----------------+-------------+----------+--------------+ Shoulder                                not visualized +-----------------+-------------+----------+--------------+ Prox upper arm       0.43                   joins      +-----------------+-------------+----------+--------------+ Mid upper arm     0.33 / 0.49                          +-----------------+-------------+----------+--------------+ Dist upper arm       0.33                              +-----------------+-------------+----------+--------------+ Antecubital fossa 0.26 / 0.31                          +-----------------+-------------+----------+--------------+ Prox forearm         0.21                              +-----------------+-------------+----------+--------------+ +-----------------+-------------+----------+--------------+ Left Cephalic    Diameter (cm)Depth (cm)   Findings    +-----------------+-------------+----------+--------------+ Shoulder             0.15                              +-----------------+-------------+----------+--------------+ Prox upper arm       0.15                              +-----------------+-------------+----------+--------------+ Mid upper arm        0.18                              +-----------------+-------------+----------+--------------+ Dist  upper arm                          not visualized +-----------------+-------------+----------+--------------+ Antecubital fossa                       not visualized +-----------------+-------------+----------+--------------+ Prox forearm         0.11                               +-----------------+-------------+----------+--------------+ Mid forearm       0.22 / 0.12             branching    +-----------------+-------------+----------+--------------+ Dist forearm         0.18                              +-----------------+-------------+----------+--------------+ Wrist                                   not visualized +-----------------+-------------+----------+--------------+ +-------------+-------------+----------+--------------------------+ Left Basilic Diameter (cm)Depth (cm)         Findings          +-------------+-------------+----------+--------------------------+ Mid upper arm                       Basilic vein transposition +-------------+-------------+----------+--------------------------+ Summary:   Measurements above. *See table(s) above for measurements and observations.  Diagnosing physician: Jamelle Haring Electronically signed by Jamelle Haring on 09/05/2020 at 4:57:45 PM.    Final     Assessment:  75 year old female with numerous comorbidities including end-stage renal disease on hemodialysis, A. fib on Eliquis presenting for follow-up of anemia and for complaints of left lower quadrant pain.  Anemia: Likely multifactorial.  History of end-stage renal disease on hemodialysis.  Chronically on Eliquis for A. fib.  Previous B12 and folate normal.  Serum iron low with normal TIBC and ferritin.  History of large hiatal hernia with Cameron ulcers, reflux esophagitis.  Last EGD in December 2020.  Last colonoscopy July 2021 with diverticulosis.  Patient was in the hospital back in December, hemoglobin was normal at that time.  Patient states recent labs have indicated decline in her hemoglobin, nephrologist concerned that she is losing blood.  She denies overt GI bleeding.  Left lower quadrant pain: Associated with nausea, mucus in the stool but no diarrhea..  Symptoms present for several weeks off and on.  Recent colonoscopy reassuring.  She does have  diverticulosis, cannot exclude diverticulitis at this time.  We discussed possibility of imaging via CT for further evaluation of her abdominal pain versus empiric treatment.  Given we are going into the weekend and CT would be potentially unavailable until next week, we agreed to treat with antibiotics initially.  If her symptoms do not resolve, she will require imaging.  Plan:  1. Cipro 500 mg daily for 10 days dosed for end-stage renal disease on hemodialysis.  Flagyl 500 mg 3 times daily for 10 days.  Dosing after dialysis on dialysis days. 2. Reduce diet to soft, low residue for the next week, then gradually add back fiber in the diet. 3. Obtain results from nephrology for review.  Further work-up of anemia based on findings. 4. She will call next week with a progress report regarding her abdominal pain.  If  persistent symptoms, plan for CT

## 2020-09-08 NOTE — Anesthesia Preprocedure Evaluation (Addendum)
Anesthesia Evaluation  Patient identified by MRN, date of birth, ID band Patient awake    Reviewed: Allergy & Precautions, NPO status , Patient's Chart, lab work & pertinent test results  History of Anesthesia Complications Negative for: history of anesthetic complications  Airway Mallampati: II  TM Distance: >3 FB Neck ROM: Full    Dental  (+) Poor Dentition, Missing, Dental Advisory Given   Pulmonary shortness of breath, sleep apnea, Continuous Positive Airway Pressure Ventilation and Oxygen sleep apnea , pneumonia (12/15-23 COVID pneumonia), resolved, COPD,  oxygen dependent, former smoker,    breath sounds clear to auscultation       Cardiovascular hypertension, (-) angina+ CAD (non-obstructive cath 2000)  + dysrhythmias Atrial Fibrillation  Rhythm:Regular Rate:Normal  12/2019 ECHO: EF 55%, mild MR, mod TR   Neuro/Psych Depression negative neurological ROS     GI/Hepatic Neg liver ROS, GERD  Medicated and Controlled,  Endo/Other  diabetes (glu 118), Insulin DependentMorbid obesity  Renal/GU Dialysis and ESRFRenal disease (K+ 4.5)     Musculoskeletal  (+) Arthritis ,   Abdominal (+) + obese,   Peds  Hematology Eliquis   Anesthesia Other Findings   Reproductive/Obstetrics                           Anesthesia Physical Anesthesia Plan  ASA: III  Anesthesia Plan: Regional   Post-op Pain Management:    Induction:   PONV Risk Score and Plan: 2 and Ondansetron and Treatment may vary due to age or medical condition  Airway Management Planned: Natural Airway and Simple Face Mask  Additional Equipment: None  Intra-op Plan:   Post-operative Plan:   Informed Consent: I have reviewed the patients History and Physical, chart, labs and discussed the procedure including the risks, benefits and alternatives for the proposed anesthesia with the patient or authorized representative who has  indicated his/her understanding and acceptance.     Dental advisory given  Plan Discussed with: CRNA and Surgeon  Anesthesia Plan Comments: (Plan routine monitors, MAC with supraclavicular block   PAT note by Karoline Caldwell, PA-C: Follows with cardiologist Dr. Terrence Dupont for history of paroxysmal atrial fibrillation on Eliquis, pulmonary hypertension, HTN.  Last seen 08/28/2020.  Stable at that time, no changes made.  Advised to continue with vascular surgery as scheduled.  Per vascular surgery, patient instructed to hold Eliquis 2 days prior to surgery.  Recently admitted 07/19/2020 through 07/27/2020 for acute on chronic hypoxic respiratory failure due to acute COVID-19 viral pneumonia.  She was noted to have been vaccinated with 2 doses of Moderna.  She also had fluid overloaded and hyperkalemic due to missed dialysis x2.  She was treated with IV remdesivir and Solu-Medrol.  Fluid overload and electrolyte abnormalities treated with dialysis.  She was discharged on 2 L O2 via nasal cannula intermittently, which was noted to be her baseline.  History of COPD maintained on Trelegy Ellipta and as needed albuterol.  OSA on CPAP.  IDDM 2, last A1c in epic was 6.0 on 04/24/2020.  ESRD on HD currently dialyzing through left IJ catheter on Monday Wednesday Friday.  She will need day of surgery labs and evaluation.  TTE 12/31/2019 (copy on chart): Summary: Normal left ventricular size.  Left ventricular systolic function was normal with normal ejection fraction of 55%. The left ventricle shows normal contractility and size. Left ventricular hypertrophy is seen with normal diastolic function noted. Mild mitral regurgitation is seen. Trace pulmonic regurgitation is present. Moderate  tricuspid regurgitation is seen.  RVSP estimated at 60 mmHg with moderate pulmonary hypertension. No intracardiac shunting is found. No intracardiac masses or thrombi are seen. No pericardial effusion is seen with no  suggestion of pericardial tamponade. )      Anesthesia Quick Evaluation

## 2020-09-08 NOTE — Progress Notes (Signed)
Ms [name] denies chest pain or pressure at this time, Patient reports that she is not any more short of breath than usually, "I got COPD."  Ms [name] has  oxygen at night and during the day if needed. Ms [name] and family had Covid in December, no one has s/s at this time.  Ms [name] has type II diabetes, patient takes Lantus and Humolog if CBG is more than 120, patient reports that she has not needed any Insulin in a long time. I instructed patient to check CBG after awaking and every 2 hours until arrival  to the hospital. Ms [name] reports that CBGs run around 110. I Instructed patient if CBG is less than 70 to take 4 Glucose Tablets or 1 tube of Glucose Gel or 1/2 cup of a clear juice. Recheck CBG in 15 minutes.  Ms [name] reports that she has chest pressure at times, she denies experiencing it in a while-  I asked over a month, patient responded yes. Patient has NTG if needed, does not know when she took it last. Dr Terrence Dupont is Ms [name] cardiologist, patient reports that she saw had an appointment last week, I will request records.

## 2020-09-08 NOTE — Patient Instructions (Signed)
1. We are treating you for diverticulitis.  For the next few days, consume a soft diet/low fiber.  We will gradually add back fiber in your diet after you have completed antibiotic therapy. 2. Take Cipro once daily for 10 days.  Take after you complete dialysis on dialysis days. 3. Take Flagyl 500 mg 3 times daily for 10 days.  Take after you complete dialysis on dialysis days. 4. We will get a copy of your most recent labs to further evaluate anemia concerns. 5. Call next week if your abdominal pain is not improved.  At that time we would consider CT scan.

## 2020-09-11 ENCOUNTER — Ambulatory Visit (HOSPITAL_COMMUNITY): Payer: Medicare Other | Admitting: Physician Assistant

## 2020-09-11 ENCOUNTER — Encounter (HOSPITAL_COMMUNITY): Payer: Self-pay

## 2020-09-11 ENCOUNTER — Encounter (HOSPITAL_COMMUNITY)
Admission: RE | Disposition: A | Discharge status: Home / Self Care | Payer: Self-pay | Source: Home / Self Care | Attending: Vascular Surgery

## 2020-09-11 ENCOUNTER — Ambulatory Visit (HOSPITAL_COMMUNITY)
Admission: RE | Admit: 2020-09-11 | Discharge: 2020-09-11 | Disposition: A | Discharge status: Home / Self Care | Payer: Medicare Other | Attending: Vascular Surgery | Admitting: Vascular Surgery

## 2020-09-11 ENCOUNTER — Other Ambulatory Visit: Payer: Self-pay

## 2020-09-11 DIAGNOSIS — N186 End stage renal disease: Secondary | ICD-10-CM

## 2020-09-11 DIAGNOSIS — Z992 Dependence on renal dialysis: Secondary | ICD-10-CM | POA: Diagnosis not present

## 2020-09-11 DIAGNOSIS — Z7901 Long term (current) use of anticoagulants: Secondary | ICD-10-CM | POA: Insufficient documentation

## 2020-09-11 DIAGNOSIS — Z794 Long term (current) use of insulin: Secondary | ICD-10-CM | POA: Diagnosis not present

## 2020-09-11 DIAGNOSIS — Z7982 Long term (current) use of aspirin: Secondary | ICD-10-CM | POA: Insufficient documentation

## 2020-09-11 DIAGNOSIS — Z79899 Other long term (current) drug therapy: Secondary | ICD-10-CM | POA: Insufficient documentation

## 2020-09-11 DIAGNOSIS — Z7951 Long term (current) use of inhaled steroids: Secondary | ICD-10-CM | POA: Insufficient documentation

## 2020-09-11 DIAGNOSIS — E1122 Type 2 diabetes mellitus with diabetic chronic kidney disease: Secondary | ICD-10-CM | POA: Diagnosis not present

## 2020-09-11 DIAGNOSIS — Z87891 Personal history of nicotine dependence: Secondary | ICD-10-CM | POA: Insufficient documentation

## 2020-09-11 DIAGNOSIS — I132 Hypertensive heart and chronic kidney disease with heart failure and with stage 5 chronic kidney disease, or end stage renal disease: Secondary | ICD-10-CM | POA: Insufficient documentation

## 2020-09-11 DIAGNOSIS — R0603 Acute respiratory distress: Secondary | ICD-10-CM

## 2020-09-11 DIAGNOSIS — I509 Heart failure, unspecified: Secondary | ICD-10-CM | POA: Insufficient documentation

## 2020-09-11 DIAGNOSIS — N185 Chronic kidney disease, stage 5: Secondary | ICD-10-CM

## 2020-09-11 HISTORY — DX: Hypotension, unspecified: I95.9

## 2020-09-11 HISTORY — PX: AV FISTULA PLACEMENT: SHX1204

## 2020-09-11 HISTORY — DX: Essential (primary) hypertension: I10

## 2020-09-11 HISTORY — DX: Other complications of anesthesia, initial encounter: T88.59XA

## 2020-09-11 HISTORY — DX: Dyspnea, unspecified: R06.00

## 2020-09-11 HISTORY — DX: Pneumonia, unspecified organism: J18.9

## 2020-09-11 LAB — POCT I-STAT, CHEM 8
BUN: 28 mg/dL — ABNORMAL HIGH (ref 8–23)
Calcium, Ion: 0.93 mmol/L — ABNORMAL LOW (ref 1.15–1.40)
Chloride: 99 mmol/L (ref 98–111)
Creatinine, Ser: 5.4 mg/dL — ABNORMAL HIGH (ref 0.44–1.00)
Glucose, Bld: 125 mg/dL — ABNORMAL HIGH (ref 70–99)
HCT: 30 % — ABNORMAL LOW (ref 36.0–46.0)
Hemoglobin: 10.2 g/dL — ABNORMAL LOW (ref 12.0–15.0)
Potassium: 4.5 mmol/L (ref 3.5–5.1)
Sodium: 141 mmol/L (ref 135–145)
TCO2: 28 mmol/L (ref 22–32)

## 2020-09-11 LAB — GLUCOSE, CAPILLARY
Glucose-Capillary: 118 mg/dL — ABNORMAL HIGH (ref 70–99)
Glucose-Capillary: 137 mg/dL — ABNORMAL HIGH (ref 70–99)

## 2020-09-11 SURGERY — INSERTION OF ARTERIOVENOUS (AV) GORE-TEX GRAFT ARM
Anesthesia: Regional | Site: Arm Upper | Laterality: Left

## 2020-09-11 MED ORDER — MIDAZOLAM HCL 2 MG/2ML IJ SOLN
INTRAMUSCULAR | Status: AC
  Filled 2020-09-11: qty 2

## 2020-09-11 MED ORDER — SODIUM CHLORIDE 0.9 % IV SOLN
INTRAVENOUS | Status: AC
  Filled 2020-09-11: qty 1.2

## 2020-09-11 MED ORDER — LACTATED RINGERS IV SOLN: INTRAVENOUS | Status: DC

## 2020-09-11 MED ORDER — HEMOSTATIC AGENTS (NO CHARGE) OPTIME
TOPICAL | Status: DC | PRN
  Administered 2020-09-11: 1 via TOPICAL

## 2020-09-11 MED ORDER — FENTANYL CITRATE (PF) 250 MCG/5ML IJ SOLN
INTRAMUSCULAR | Status: AC
  Filled 2020-09-11: qty 5

## 2020-09-11 MED ORDER — OXYCODONE-ACETAMINOPHEN 5-325 MG PO TABS: 1.0000 | ORAL_TABLET | ORAL | 0 refills | Status: DC | PRN

## 2020-09-11 MED ORDER — LIDOCAINE-EPINEPHRINE (PF) 1 %-1:200000 IJ SOLN
INTRAMUSCULAR | Status: AC
  Filled 2020-09-11: qty 30

## 2020-09-11 MED ORDER — CHLORHEXIDINE GLUCONATE 4 % EX LIQD: 60.0000 mL | Freq: Once | CUTANEOUS | Status: DC

## 2020-09-11 MED ORDER — HEPARIN SODIUM (PORCINE) 1000 UNIT/ML IJ SOLN
INTRAMUSCULAR | Status: DC | PRN
  Administered 2020-09-11: 5000 [IU] via INTRAVENOUS

## 2020-09-11 MED ORDER — OXYCODONE HCL 5 MG/5ML PO SOLN: 5.0000 mg | Freq: Once | ORAL | Status: DC | PRN

## 2020-09-11 MED ORDER — HEPARIN SODIUM (PORCINE) 1000 UNIT/ML IJ SOLN
INTRAMUSCULAR | Status: AC
  Filled 2020-09-11: qty 1

## 2020-09-11 MED ORDER — MIDAZOLAM HCL 2 MG/2ML IJ SOLN: 0.5000 mg | Freq: Once | INTRAMUSCULAR | Status: DC | PRN

## 2020-09-11 MED ORDER — ORAL CARE MOUTH RINSE: 15.0000 mL | Freq: Once | OROMUCOSAL | Status: AC

## 2020-09-11 MED ORDER — PHENYLEPHRINE 40 MCG/ML (10ML) SYRINGE FOR IV PUSH (FOR BLOOD PRESSURE SUPPORT)
PREFILLED_SYRINGE | INTRAVENOUS | Status: DC | PRN
  Administered 2020-09-11: 80 ug via INTRAVENOUS

## 2020-09-11 MED ORDER — 0.9 % SODIUM CHLORIDE (POUR BTL) OPTIME
TOPICAL | Status: DC | PRN
  Administered 2020-09-11: 1000 mL

## 2020-09-11 MED ORDER — FENTANYL CITRATE (PF) 250 MCG/5ML IJ SOLN
INTRAMUSCULAR | Status: DC | PRN
  Administered 2020-09-11: 50 ug via INTRAVENOUS

## 2020-09-11 MED ORDER — MEPERIDINE HCL 25 MG/ML IJ SOLN: 6.2500 mg | INTRAMUSCULAR | Status: DC | PRN

## 2020-09-11 MED ORDER — OXYCODONE HCL 5 MG PO TABS: 5.0000 mg | ORAL_TABLET | Freq: Once | ORAL | Status: DC | PRN

## 2020-09-11 MED ORDER — LIDOCAINE-EPINEPHRINE (PF) 1 %-1:200000 IJ SOLN
INTRAMUSCULAR | Status: DC | PRN
  Administered 2020-09-11: 10 mL via INTRADERMAL

## 2020-09-11 MED ORDER — BUPIVACAINE-EPINEPHRINE (PF) 0.5% -1:200000 IJ SOLN
INTRAMUSCULAR | Status: DC | PRN
  Administered 2020-09-11: 25 mL via PERINEURAL

## 2020-09-11 MED ORDER — PHENYLEPHRINE HCL-NACL 10-0.9 MG/250ML-% IV SOLN
INTRAVENOUS | Status: DC | PRN
  Administered 2020-09-11: 30 ug/min via INTRAVENOUS

## 2020-09-11 MED ORDER — SODIUM CHLORIDE 0.9 % IV SOLN
INTRAVENOUS | Status: DC | PRN
  Administered 2020-09-11: 500 mL

## 2020-09-11 MED ORDER — PROMETHAZINE HCL 25 MG/ML IJ SOLN: 6.2500 mg | INTRAMUSCULAR | Status: DC | PRN

## 2020-09-11 MED ORDER — PROPOFOL 500 MG/50ML IV EMUL
INTRAVENOUS | Status: DC | PRN
  Administered 2020-09-11: 75 ug/kg/min via INTRAVENOUS

## 2020-09-11 MED ORDER — HEPARIN SODIUM (PORCINE) 1000 UNIT/ML IJ SOLN
1900.0000 [IU] | Freq: Once | INTRAMUSCULAR | Status: AC
  Administered 2020-09-11: 1900 [IU]

## 2020-09-11 MED ORDER — CHLORHEXIDINE GLUCONATE 0.12 % MT SOLN
15.0000 mL | Freq: Once | OROMUCOSAL | Status: AC
  Administered 2020-09-11: 15 mL via OROMUCOSAL
  Filled 2020-09-11: qty 15

## 2020-09-11 MED ORDER — LIDOCAINE HCL (PF) 1 % IJ SOLN
INTRAMUSCULAR | Status: AC
  Filled 2020-09-11: qty 30

## 2020-09-11 MED ORDER — SODIUM CHLORIDE 0.9 % IV SOLN: INTRAVENOUS | Status: DC

## 2020-09-11 MED ORDER — FENTANYL CITRATE (PF) 100 MCG/2ML IJ SOLN: 25.0000 ug | INTRAMUSCULAR | Status: DC | PRN

## 2020-09-11 MED ORDER — CEFAZOLIN SODIUM-DEXTROSE 2-4 GM/100ML-% IV SOLN
2.0000 g | INTRAVENOUS | Status: AC
  Administered 2020-09-11: 2 g via INTRAVENOUS
  Filled 2020-09-11: qty 100

## 2020-09-11 MED ORDER — PROPOFOL 10 MG/ML IV BOLUS
INTRAVENOUS | Status: AC
  Filled 2020-09-11: qty 40

## 2020-09-11 SURGICAL SUPPLY — 47 items
ADH SKN CLS APL DERMABOND .7 (GAUZE/BANDAGES/DRESSINGS) ×1
APL PRP STRL LF DISP 70% ISPRP (MISCELLANEOUS) ×1
APL SKNCLS STERI-STRIP NONHPOA (GAUZE/BANDAGES/DRESSINGS) ×1
ARMBAND PINK RESTRICT EXTREMIT (MISCELLANEOUS) ×4 IMPLANT
BENZOIN TINCTURE PRP APPL 2/3 (GAUZE/BANDAGES/DRESSINGS) ×2 IMPLANT
CANISTER SUCT 3000ML PPV (MISCELLANEOUS) ×2 IMPLANT
CANNULA VESSEL 3MM 2 BLNT TIP (CANNULA) ×1 IMPLANT
CHLORAPREP W/TINT 26 (MISCELLANEOUS) ×2 IMPLANT
CLIP VESOCCLUDE MED 6/CT (CLIP) ×2 IMPLANT
CLIP VESOCCLUDE SM WIDE 6/CT (CLIP) ×2 IMPLANT
CLSR STERI-STRIP ANTIMIC 1/2X4 (GAUZE/BANDAGES/DRESSINGS) ×1 IMPLANT
COVER PROBE W GEL 5X96 (DRAPES) ×1 IMPLANT
DERMABOND ADVANCED (GAUZE/BANDAGES/DRESSINGS) ×1
DERMABOND ADVANCED .7 DNX12 (GAUZE/BANDAGES/DRESSINGS) IMPLANT
DRSG TEGADERM 4X4.75 (GAUZE/BANDAGES/DRESSINGS) ×1 IMPLANT
ELECT REM PT RETURN 9FT ADLT (ELECTROSURGICAL) ×2
ELECTRODE REM PT RTRN 9FT ADLT (ELECTROSURGICAL) ×1 IMPLANT
GAUZE SPONGE 4X4 12PLY STRL LF (GAUZE/BANDAGES/DRESSINGS) ×1 IMPLANT
GLOVE BIO SURGEON STRL SZ8 (GLOVE) ×2 IMPLANT
GOWN STRL REUS W/ TWL LRG LVL3 (GOWN DISPOSABLE) ×2 IMPLANT
GOWN STRL REUS W/ TWL XL LVL3 (GOWN DISPOSABLE) ×1 IMPLANT
GOWN STRL REUS W/TWL LRG LVL3 (GOWN DISPOSABLE) ×4
GOWN STRL REUS W/TWL XL LVL3 (GOWN DISPOSABLE) ×2
GRAFT GORETEX STRT 4-7X45 (Vascular Products) ×1 IMPLANT
HEMOSTAT SNOW SURGICEL 2X4 (HEMOSTASIS) IMPLANT
HEMOSTAT SURGICEL 2X14 (HEMOSTASIS) ×1 IMPLANT
INSERT FOGARTY SM (MISCELLANEOUS) ×2 IMPLANT
KIT BASIN OR (CUSTOM PROCEDURE TRAY) ×2 IMPLANT
KIT TURNOVER KIT B (KITS) ×2 IMPLANT
NS IRRIG 1000ML POUR BTL (IV SOLUTION) ×2 IMPLANT
PACK CV ACCESS (CUSTOM PROCEDURE TRAY) ×2 IMPLANT
PAD ARMBOARD 7.5X6 YLW CONV (MISCELLANEOUS) ×4 IMPLANT
SLING ARM FOAM STRAP LRG (SOFTGOODS) ×1 IMPLANT
STOCKINETTE 6  STRL (DRAPES) ×2
STOCKINETTE 6 STRL (DRAPES) IMPLANT
STRIP CLOSURE SKIN 1/2X4 (GAUZE/BANDAGES/DRESSINGS) ×2 IMPLANT
SUT GORETEX 6.0 TT9 (SUTURE) ×1 IMPLANT
SUT MNCRL AB 4-0 PS2 18 (SUTURE) ×1 IMPLANT
SUT PROLENE 6 0 BV (SUTURE) ×3 IMPLANT
SUT SILK 2 0 SH (SUTURE) ×2 IMPLANT
SUT VIC AB 3-0 SH 27 (SUTURE) ×2
SUT VIC AB 3-0 SH 27X BRD (SUTURE) ×2 IMPLANT
SYR CONTROL 10ML LL (SYRINGE) ×1 IMPLANT
SYR TOOMEY 50ML (SYRINGE) IMPLANT
TOWEL GREEN STERILE (TOWEL DISPOSABLE) ×2 IMPLANT
UNDERPAD 30X36 HEAVY ABSORB (UNDERPADS AND DIAPERS) ×2 IMPLANT
WATER STERILE IRR 1000ML POUR (IV SOLUTION) ×2 IMPLANT

## 2020-09-11 NOTE — Transfer of Care (Signed)
Immediate Anesthesia Transfer of Care Note  Patient: [name]  Procedure(s) Performed: INSERTION OF ARTERIOVENOUS (AV) GORE-TEX GRAFT ARM LEFT (Left Arm Upper)  Patient Location: PACU  Anesthesia Type:MAC combined with regional for post-op pain  Level of Consciousness: awake, alert  and patient cooperative  Airway & Oxygen Therapy: Patient Spontanous Breathing  Post-op Assessment: Report given to RN and Post -op Vital signs reviewed and stable  Post vital signs: Reviewed and stable  Last Vitals:  Vitals Value Taken Time  BP 123/39   Temp    Pulse 79   Resp 18   SpO2 98     Last Pain:  Vitals:   09/11/20 0639  PainSc: 7          Complications: No complications documented.

## 2020-09-11 NOTE — Op Note (Signed)
DATE OF SERVICE: 09/11/2020  PATIENT:  [name]  75 y.o. female  PRE-OPERATIVE DIAGNOSIS:  ESRD in need of permanent HD access  POST-OPERATIVE DIAGNOSIS:  Same  PROCEDURE:   LUE AVG creation  SURGEON:  Surgeon(s) and Role:    * Cherre Robins, MD - Primary  ASSISTANT: Paulo Fruit, PA-C  An assistant was required to facilitate exposure and expedite the case.  ANESTHESIA:   regional and MAC  EBL: min  BLOOD ADMINISTERED:none  DRAINS: none   LOCAL MEDICATIONS USED:  LIDOCAINE   SPECIMEN:  none  COUNTS: confirmed correct.  TOURNIQUET:  None  PATIENT DISPOSITION:  PACU - hemodynamically stable.   Delay start of Pharmacological VTE agent (>24hrs) due to surgical blood loss or risk of bleeding: no  INDICATION FOR PROCEDURE: [name] is a 75 y.o. female with ESRD. She underwent left upper extremity AVF creation in early 2021. This has failed to mature. After careful discussion of risks, benefits, and alternatives the patient was offered LUE AVG. We specifically discussed steal syndrome. The patient understood and wished to proceed.  OPERATIVE FINDINGS: AVG interposition sewn into existing AVF.  DESCRIPTION OF PROCEDURE: After identification of the patient in the pre-operative holding area, the patient was transferred to the operating room. The patient was positioned supine on the operating room table. Anesthesia was induced. The left upper extremity was prepped and draped in standard fashion. A surgical pause was performed confirming correct patient, procedure, and operative location.  The left proximal AVF was exposed using a transverse incision in the distal arm just above the antecubital fossa.  Incision was carried down through subcutaneous tissue until the fistula was encountered. The fistula was exposed and encircled with Silastic Vesseloops.   The left brachial vein at the axilla was exposed using longitudinal incision just below the hairbearing  area.  Incision was carried down to the brachial sheath was encountered.  The brachial vein was identified, exposed, encircled with Silastic Vesseloops.   Using a curved, sheathed tunneling device, a 4-7 mm tapered Gore-Tex graft was tunneled subcutaneously and gentle arc across the biceps of the left arm.  Patient was then heparinized with 5000 units of IV heparin.   The AVF was then clamped proximally and distally. The proximal (inflow) fistula was transected and spatulated. The 4 mm end of the Gore-Tex graft was removed and the 69m section anastomosed end-to-end to the fistula using continuous running suture of 6-0 Prolene.  The anastomosis was completed and hemostasis ensured.    The central AVF was clamped proximally and distally.  The fistula was transected and spatulated. The 7 mm end of the Gore-Tex graft was then anastomosed end to end to the fistula using continuous running suture of 6-0 Prolene.  Immediately prior to completion the anastomosis was de-aired and flushed.  Anastomosis was then completed.  Hemostasis was insured.   Doppler machine was brought onto the field to interrogate the graft.  Doppler flow was noted in the radial artery.  About the arterial anastomosis flow was noted proximal and distal to the arterial anastomosis.  Distal to the venous anastomosis a Doppler bruit was heard.  Satisfied we ended the case here.   Surgical beds were irrigated copiously.  Hemostasis was again ensured in the surgical beds.  The wounds were closed in layers using 3-0 Vicryl and 4-0 Monocryl.  Clean bandages were applied.  Upon completion of the case instrument and sharps counts were confirmed correct. The patient was transferred to the PACU  in good condition. I was present for all portions of the procedure.  Rita Aline. Stanford Breed, MD [facility] Phone Number: [phone] 09/11/2020 9:11 AM

## 2020-09-11 NOTE — Anesthesia Postprocedure Evaluation (Signed)
Anesthesia Post Note  Patient: DIARRA CEJA  Procedure(s) Performed: INSERTION OF ARTERIOVENOUS (AV) GORE-TEX GRAFT ARM LEFT (Left Arm Upper)     Patient location during evaluation: PACU Anesthesia Type: Regional Level of consciousness: awake and alert, patient cooperative and oriented Pain management: pain level controlled Vital Signs Assessment: post-procedure vital signs reviewed and stable Respiratory status: spontaneous breathing, nonlabored ventilation, respiratory function stable and patient connected to nasal cannula oxygen Cardiovascular status: blood pressure returned to baseline and stable Postop Assessment: no apparent nausea or vomiting and able to ambulate Anesthetic complications: no   No complications documented.  Last Vitals:  Vitals:   09/11/20 1000 09/11/20 1010  BP: (!) 125/55   Pulse: 73 68  Resp: 13 14  Temp:  36.9 C  SpO2: 97% 94%    Last Pain:  Vitals:   09/11/20 1000  PainSc: 0-No pain                 Zehra Rucci,E. Josian Lanese

## 2020-09-11 NOTE — Telephone Encounter (Signed)
Esomeprazole (Nexium) 40 mg and omeprazole 40 mg both been written by Dr. Huel Cote.  It appears that she has had multiple refills of both medications in the last 3 - 4 months.  Does not make sense for her to take omeprazole and esomeprazole at the same time, please advise patient to reach out to her PCP regarding this matter.

## 2020-09-11 NOTE — Telephone Encounter (Signed)
Spoke with pt. Pt is going to reach out to her PCP and discuss which medication she should take (Omeprazole vs Nexium).

## 2020-09-11 NOTE — Anesthesia Procedure Notes (Signed)
Anesthesia Regional Block: Supraclavicular block   Pre-Anesthetic Checklist: ,, timeout performed, Correct Patient, Correct Site, Correct Laterality, Correct Procedure, Correct Position, site marked, Risks and benefits discussed,  Surgical consent,  Pre-op evaluation,  At surgeon's request and post-op pain management  Laterality: Left and Upper  Prep: chloraprep       Needles:   Needle Type: Echogenic Needle     Needle Length: 9cm  Needle Gauge: 21     Additional Needles:   Procedures:,,,, ultrasound used (permanent image in chart),,,,  Narrative:  Start time: 09/11/2020 7:15 AM End time: 09/11/2020 7:22 AM Injection made incrementally with aspirations every 5 mL.  Performed by: Personally  Anesthesiologist: Annye Asa, MD  Additional Notes: Pt identified in Holding room.  Monitors applied. Working IV access confirmed. Sterile prep L clavicle and neck.  #21ga ECHOgenic needle to supraclavicular brachial plexus with US guidance.  25cc 0.5% Bupivacaine with 1:200k epi injected incrementally after negative test dose.  Patient asymptomatic, VSS, no heme aspirated, tolerated well.  Jenita Seashore, MD

## 2020-09-11 NOTE — Interval H&P Note (Signed)
History and Physical Interval Note:  09/11/2020 7:11 AM  [name]  has presented today for surgery, with the diagnosis of ESRD.  The various methods of treatment have been discussed with the patient and family. After consideration of risks, benefits and other options for treatment, the patient has consented to  Procedure(s): INSERTION OF ARTERIOVENOUS (AV) GORE-TEX GRAFT ARM LEFT (Left) as a surgical intervention.  The patient's history has been reviewed, patient examined, no change in status, stable for surgery.  I have reviewed the patient's chart and labs.  Questions were answered to the patient's satisfaction.     Cherre Robins

## 2020-09-11 NOTE — Telephone Encounter (Signed)
Lmom, waiting on a return call.  

## 2020-09-11 NOTE — Discharge Instructions (Addendum)
[facility]  Discharge Instructions  AV Fistula or Graft Surgery for Dialysis Access  Please refer to the following instructions for your post-procedure care. Your surgeon or physician assistant will discuss any changes with you.  Activity  You may drive the day following your surgery, if you are comfortable and no longer taking prescription pain medication. Resume full activity as the soreness in your incision resolves.  Bathing/Showering  You may shower after you go home. Keep your incision dry for 48 hours. Do not soak in a bathtub, hot tub, or swim until the incision heals completely. You may not shower if you have a hemodialysis catheter.  Incision Care  Clean your incision with mild soap and water after 48 hours. Pat the area dry with a clean towel. You do not need a bandage unless otherwise instructed. Do not apply any ointments or creams to your incision. You may have skin glue on your incision. Do not peel it off. It will come off on its own in about one week. Your arm may swell a bit after surgery. To reduce swelling use pillows to elevate your arm so it is above your heart. Your doctor will tell you if you need to lightly wrap your arm with an ACE bandage.  Diet  Resume your normal diet. There are not special food restrictions following this procedure. In order to heal from your surgery, it is CRITICAL to get adequate nutrition. Your body requires vitamins, minerals, and protein. Vegetables are the best source of vitamins and minerals. Vegetables also provide the perfect balance of protein. Processed food has little nutritional value, so try to avoid this.  Medications  Resume taking all of your medications. If your incision is causing pain, you may take over-the counter pain relievers such as acetaminophen (Tylenol). If you were prescribed a stronger pain medication, please be aware these medications can cause nausea and constipation. Prevent  nausea by taking the medication with a snack or meal. Avoid constipation by drinking plenty of fluids and eating foods with high amount of fiber, such as fruits, vegetables, and grains.  Do not take Tylenol if you are taking prescription pain medications.  Follow up Your surgeon may want to see you in the office following your access surgery. If so, this will be arranged at the time of your surgery.  Please call us immediately for any of the following conditions:   Increased pain, redness, drainage (pus) from your incision site  Fever of 101 degrees or higher  Severe or worsening pain at your incision site  Hand pain or numbness.   Reduce your risk of vascular disease:   Stop smoking. If you would like help, call [facility] at 1-800-QUIT-NOW 734-038-5155) or [place] at [place] your cholesterol  Maintain a desired weight  Control your diabetes  Keep your blood pressure down  Dialysis  It will take several weeks to several months for your new dialysis access to be ready for use. Your surgeon will determine when it is okay to use it. Your nephrologist will continue to direct your dialysis. You can continue to use your Permcath until your new access is ready for use.   09/11/2020 [name] [id number] 03/01/1946  Surgeon(s): Cherre Robins, MD  Procedure(s): INSERTION OF ARTERIOVENOUS (AV) GORE-TEX GRAFT ARM LEFT   May stick graft immediately   May stick graft on designated area only:   X Do not stick left AV graft for 4 weeks  If you have any questions, please call the office at [phone].

## 2020-09-11 NOTE — Telephone Encounter (Signed)
Spoke with pt. Pt was given both medications and states the pharmacy said she should take both medications at the same time. Pt has been taken both Omeprazole and Nexium for 1 week.

## 2020-09-12 ENCOUNTER — Encounter (HOSPITAL_COMMUNITY): Payer: Self-pay | Admitting: Vascular Surgery

## 2020-10-10 ENCOUNTER — Encounter (HOSPITAL_COMMUNITY): Payer: Medicare Other

## 2020-10-13 ENCOUNTER — Encounter (HOSPITAL_COMMUNITY): Payer: Medicare Other

## 2020-10-20 ENCOUNTER — Other Ambulatory Visit: Payer: Self-pay | Admitting: *Deleted

## 2020-10-20 DIAGNOSIS — N186 End stage renal disease: Secondary | ICD-10-CM

## 2020-10-23 ENCOUNTER — Ambulatory Visit (HOSPITAL_COMMUNITY)
Admission: RE | Admit: 2020-10-23 | Discharge: 2020-10-23 | Disposition: A | Discharge status: Home / Self Care | Payer: Medicare Other | Source: Ambulatory Visit | Attending: Surgery | Admitting: Surgery

## 2020-10-23 ENCOUNTER — Other Ambulatory Visit: Payer: Self-pay

## 2020-10-23 ENCOUNTER — Ambulatory Visit (INDEPENDENT_AMBULATORY_CARE_PROVIDER_SITE_OTHER): Payer: Medicare Other | Admitting: Physician Assistant

## 2020-10-23 VITALS — BP 137/58 | HR 69 | Temp 97.5°F | Resp 14 | Ht <= 58 in | Wt 159.0 lb

## 2020-10-23 DIAGNOSIS — N186 End stage renal disease: Secondary | ICD-10-CM | POA: Diagnosis present

## 2020-10-24 NOTE — H&P (View-Only) (Signed)
    Postoperative Access Visit   History of Present Illness   [name] is a 75 y.o. year old female who presents for postoperative follow-up for: left upper arm arteriovenous graft by Dr. Stanford Breed (Date: 09/11/20).  The patient's wounds are healed.  The patient denies steal symptoms.  The patient is able to complete their activities of daily living.  She is currently dialyzing via left IJ TDC.  She is dialyzing on a Tuesday Thursday Saturday schedule at the [facility] kidney location at [address].  Surgical history also significant for left arm second stage basilic vein transposition.  Her fistula only lasted through 2 weeks of use.  Physical Examination   Vitals:   10/23/20 1536  BP: (!) 137/58  Pulse: 69  Resp: 14  Temp: (!) 97.5 F (36.4 C)  TempSrc: Temporal  SpO2: 98%  Weight: 159 lb (72.1 kg)  Height: 4\' 8"  (1.422 m)   Body mass index is 35.65 kg/m.  left arm Incisions are healed, palpable radial pulse, hand grip is 5/5, sensation in digits is intact, no palpable thrill, no bruit    Medical Decision Making   [name] is a 75 y.o. 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  Case was discussed with Dr. Stanford Breed.  Plan will be to proceed with venogram of right upper extremity prior to new dialysis access placement in right arm.  Based on vein mapping performed in February patient will likely require right arm AV graft.  Patient will be called and notified of plan   Dagoberto Ligas PA-C [facility] [place] Office: [phone]  Clinic MD: Trula Slade

## 2020-10-24 NOTE — Progress Notes (Signed)
    Postoperative Access Visit   History of Present Illness   [name] is a 75 y.o. year old female who presents for postoperative follow-up for: left upper arm arteriovenous graft by Dr. Stanford Breed (Date: 09/11/20).  The patient's wounds are healed.  The patient denies steal symptoms.  The patient is able to complete their activities of daily living.  She is currently dialyzing via left IJ TDC.  She is dialyzing on a Tuesday Thursday Saturday schedule at the [facility] kidney location at [address].  Surgical history also significant for left arm second stage basilic vein transposition.  Her fistula only lasted through 2 weeks of use.  Physical Examination   Vitals:   10/23/20 1536  BP: (!) 137/58  Pulse: 69  Resp: 14  Temp: (!) 97.5 F (36.4 C)  TempSrc: Temporal  SpO2: 98%  Weight: 159 lb (72.1 kg)  Height: 4\' 8"  (1.422 m)   Body mass index is 35.65 kg/m.  left arm Incisions are healed, palpable radial pulse, hand grip is 5/5, sensation in digits is intact, no palpable thrill, no bruit    Medical Decision Making   [name] is a 75 y.o. 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  Case was discussed with Dr. Stanford Breed.  Plan will be to proceed with venogram of right upper extremity prior to new dialysis access placement in right arm.  Based on vein mapping performed in February patient will likely require right arm AV graft.  Patient will be called and notified of plan   Dagoberto Ligas PA-C [facility] [place] Office: [phone]  Clinic MD: Trula Slade

## 2020-11-08 ENCOUNTER — Other Ambulatory Visit: Payer: Self-pay

## 2020-11-08 MED ORDER — SODIUM CHLORIDE 0.9% FLUSH: 3.0000 mL | Freq: Two times a day (BID) | INTRAVENOUS | Status: DC

## 2020-11-08 MED ORDER — SODIUM CHLORIDE 0.9% FLUSH: 3.0000 mL | INTRAVENOUS | Status: DC | PRN

## 2020-11-08 MED ORDER — SODIUM CHLORIDE 0.9 % IV SOLN: 250.0000 mL | INTRAVENOUS | Status: DC | PRN

## 2020-11-14 DIAGNOSIS — Z959 Presence of cardiac and vascular implant and graft, unspecified: Secondary | ICD-10-CM | POA: Insufficient documentation

## 2020-11-20 ENCOUNTER — Telehealth: Payer: Self-pay | Admitting: Gastroenterology

## 2020-11-20 NOTE — Telephone Encounter (Signed)
Can you request labs cbc and iron labs from her nephrologist, [facility].

## 2020-11-21 ENCOUNTER — Other Ambulatory Visit (HOSPITAL_COMMUNITY)
Admission: RE | Admit: 2020-11-21 | Discharge: 2020-11-21 | Disposition: A | Discharge status: Home / Self Care | Payer: Medicare Other | Source: Ambulatory Visit | Attending: Vascular Surgery | Admitting: Vascular Surgery

## 2020-11-21 DIAGNOSIS — Z20822 Contact with and (suspected) exposure to covid-19: Secondary | ICD-10-CM | POA: Insufficient documentation

## 2020-11-21 DIAGNOSIS — Z01812 Encounter for preprocedural laboratory examination: Secondary | ICD-10-CM | POA: Insufficient documentation

## 2020-11-21 LAB — SARS CORONAVIRUS 2 (TAT 6-24 HRS): SARS Coronavirus 2: NEGATIVE

## 2020-11-22 ENCOUNTER — Other Ambulatory Visit: Payer: Self-pay

## 2020-11-22 ENCOUNTER — Ambulatory Visit (HOSPITAL_COMMUNITY)
Admission: RE | Admit: 2020-11-22 | Discharge: 2020-11-22 | Disposition: A | Discharge status: Home / Self Care | Payer: Medicare Other | Source: Ambulatory Visit | Attending: Vascular Surgery | Admitting: Vascular Surgery

## 2020-11-22 ENCOUNTER — Encounter (HOSPITAL_COMMUNITY)
Admission: RE | Disposition: A | Discharge status: Home / Self Care | Payer: Self-pay | Source: Ambulatory Visit | Attending: Vascular Surgery

## 2020-11-22 ENCOUNTER — Encounter (HOSPITAL_COMMUNITY): Payer: Self-pay | Admitting: Vascular Surgery

## 2020-11-22 DIAGNOSIS — N186 End stage renal disease: Secondary | ICD-10-CM | POA: Insufficient documentation

## 2020-11-22 DIAGNOSIS — Z992 Dependence on renal dialysis: Secondary | ICD-10-CM | POA: Insufficient documentation

## 2020-11-22 DIAGNOSIS — N185 Chronic kidney disease, stage 5: Secondary | ICD-10-CM

## 2020-11-22 HISTORY — PX: UPPER EXTREMITY VENOGRAPHY: CATH118272

## 2020-11-22 LAB — POCT I-STAT, CHEM 8
BUN: 30 mg/dL — ABNORMAL HIGH (ref 8–23)
Calcium, Ion: 1.11 mmol/L — ABNORMAL LOW (ref 1.15–1.40)
Chloride: 100 mmol/L (ref 98–111)
Creatinine, Ser: 5.1 mg/dL — ABNORMAL HIGH (ref 0.44–1.00)
Glucose, Bld: 113 mg/dL — ABNORMAL HIGH (ref 70–99)
HCT: 38 % (ref 36.0–46.0)
Hemoglobin: 12.9 g/dL (ref 12.0–15.0)
Potassium: 5.4 mmol/L — ABNORMAL HIGH (ref 3.5–5.1)
Sodium: 140 mmol/L (ref 135–145)
TCO2: 32 mmol/L (ref 22–32)

## 2020-11-22 LAB — GLUCOSE, CAPILLARY: Glucose-Capillary: 97 mg/dL (ref 70–99)

## 2020-11-22 SURGERY — UPPER EXTREMITY VENOGRAPHY
Anesthesia: LOCAL

## 2020-11-22 MED ORDER — IODIXANOL 320 MG/ML IV SOLN
INTRAVENOUS | Status: DC | PRN
  Administered 2020-11-22: 30 mL via INTRAVENOUS

## 2020-11-22 SURGICAL SUPPLY — 2 items
STOPCOCK MORSE 400PSI 3WAY (MISCELLANEOUS) ×1 IMPLANT
TUBING CIL FLEX 10 FLL-RA (TUBING) ×1 IMPLANT

## 2020-11-22 NOTE — Op Note (Signed)
DATE OF SERVICE: 11/22/2020  PATIENT:  [name]  75 y.o. female  PRE-OPERATIVE DIAGNOSIS:  ESRD in need of permanent HD access  POST-OPERATIVE DIAGNOSIS:  Same  PROCEDURE:   Right upper extremity venogram  SURGEON:  Surgeon(s) and Role:    * Cherre Robins, MD - Primary  ASSISTANT: none  ANESTHESIA:   none  EBL: none  BLOOD ADMINISTERED:none  DRAINS: none   LOCAL MEDICATIONS USED:  NONE  SPECIMEN:  none  COUNTS: confirmed correct.  TOURNIQUET:  None  PATIENT DISPOSITION:  PACU - hemodynamically stable.   Delay start of Pharmacological VTE agent (>24hrs) due to surgical blood loss or risk of bleeding: no  INDICATION FOR PROCEDURE: [name] is a 75 y.o. female with ESRD in need of permanent HD access. She has failed two left upper extremity access creations. She has a left IJ [facility] which is likely causing central stenosis. She has a right IJ port for iron infusions as well. After careful discussion of risks, benefits, and alternatives the patient was offered venogram. The patient understood and wished to proceed.  OPERATIVE FINDINGS: brachial vein in right upper arm is healthy. No clear central stenosis from right IJ port.   DESCRIPTION OF PROCEDURE: After identification of the patient in the pre-operative holding area, the patient was transferred to the operating room. The patient was positioned supine on the operating room table. A peripheral IV was inserted into the right hand. A surgical pause was performed confirming correct patient, procedure, and operative location.  Contrast injections were performed through the right hand peripheral IV. A healthy brachial vein was noted in the upper arm. This continued to be healthy through the axillary and subclavian segments. A RIJ port was noted. No clear stenosis was noted from the [place] port.  Upon completion of the case instrument and sharps counts were confirmed correct. The patient was transferred to the  PACU in good condition. I was present for all portions of the procedure.  [name]. Stanford Breed, MD [facility] Phone Number: [phone] 11/22/2020 11:15 AM

## 2020-11-22 NOTE — Discharge Instructions (Signed)
Venogram What can I expect after the procedure?  Your blood pressure, heart rate, breathing rate, and blood oxygen level will be monitored until you leave the hospital or clinic.  You may be given something to eat and drink.  You may have bruising or mild discomfort in the area where the IV was inserted. Follow these instructions at home: Eating and drinking  Follow instructions from your health care provider about eating or drinking restrictions.  Drink a lot of water for the first several days after the procedure, as directed by your health care provider. This helps to flush the contrast out of your body.   Activity  Rest as told by your health care provider.  Return to your normal activities as told by your health care provider. Ask your health care provider what activities are safe for you.  If you were given a sedative during your procedure, do not drive for 24 hours or until your health care provider approves. General instructions  Check your IV insertion area every day for signs of infection. Check for: ? Redness, swelling, or pain. ? Fluid or blood. ? Warmth. ? Pus or a bad smell.  Take over-the-counter and prescription medicines only as told by your health care provider.  Keep all follow-up visits as told by your health care provider. This is important. Contact a health care provider if:  Your skin becomes itchy or you develop a rash or hives.  You have a fever that does not get better with medicine.  You feel nauseous or you vomit.  You have redness, swelling, or pain around the insertion site.  You have fluid or blood coming from the insertion site.  Your insertion area feels warm to the touch.  You have pus or a bad smell coming from the insertion site. Get help right away if you:  Have shortness of breath or difficulty breathing.  Develop  chest pain.  Faint.  Feel very dizzy. These symptoms may represent a serious problem that is an emergency. Do not wait to see if the symptoms will go away. Get medical help right away. Call your local emergency services (911 in the [place] Do not drive yourself to the hospital. Summary  A venogram, or venography, is a procedure that uses an X-ray and contrast dye to check how well the veins work and how blood flows through them.  An IV will be inserted into one of your veins in order to inject the contrast.  During the exam, you will lie on an X-ray table. The table may be tilted in different directions during the procedure to help the contrast move throughout your body. Safety straps will keep you secure.  After the procedure, you will need to drink a lot of water to help wash or flush the contrast out of your body. This information is not intended to replace advice given to you by your health care provider. Make sure you discuss any questions you have with your health care provider. Document Revised: 02/27/2019 Document Reviewed: 02/27/2019 Elsevier Patient Education  [place].

## 2020-11-22 NOTE — Interval H&P Note (Signed)
History and Physical Interval Note:  11/22/2020 11:15 AM  [name]  has presented today for surgery, with the diagnosis of instage renal.  The various methods of treatment have been discussed with the patient and family. After consideration of risks, benefits and other options for treatment, the patient has consented to  Procedure(s): UPPER EXTREMITY VENOGRAPHY (N/A) as a surgical intervention.  The patient's history has been reviewed, patient examined, no change in status, stable for surgery.  I have reviewed the patient's chart and labs.  Questions were answered to the patient's satisfaction.     Cherre Robins

## 2020-11-23 ENCOUNTER — Other Ambulatory Visit: Payer: Self-pay

## 2020-11-23 NOTE — Telephone Encounter (Signed)
Sent a request for labs

## 2020-11-25 IMAGING — CT CT HEAD W/O CM
4 series · 17 of 47 positions shown, 19 images · non-contrast
Comparison: 04/07/2020
COMPARISON: 04/07/2020

Addendum:
CLINICAL DATA: Fall on blood thinners

EXAM:
CT HEAD WITHOUT CONTRAST
TECHNIQUE: Contiguous axial images were obtained from the base of the skull
through the vertex without intravenous contrast.

[Series 3: head wo · axial · 0.51mm/px · z∈[-139,-14]mm · 7 of 35 slices shown, 9 images]
[im 5/35  brain]
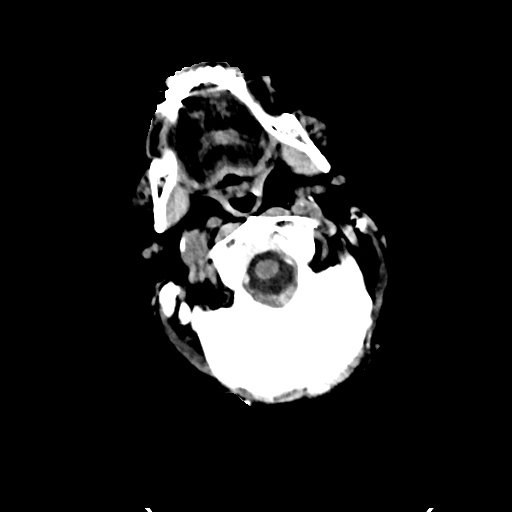
[im 5/35  bone]
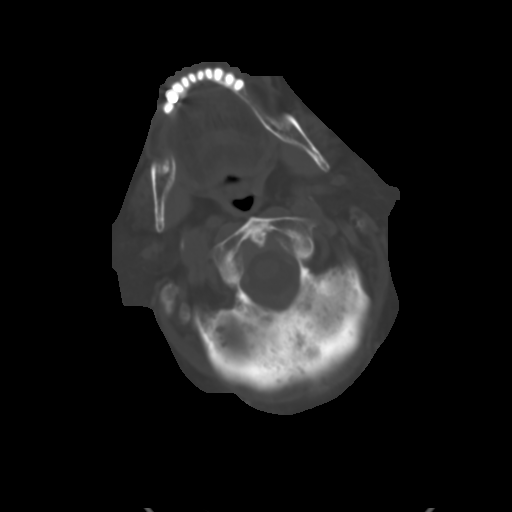
[im 9/35  brain]
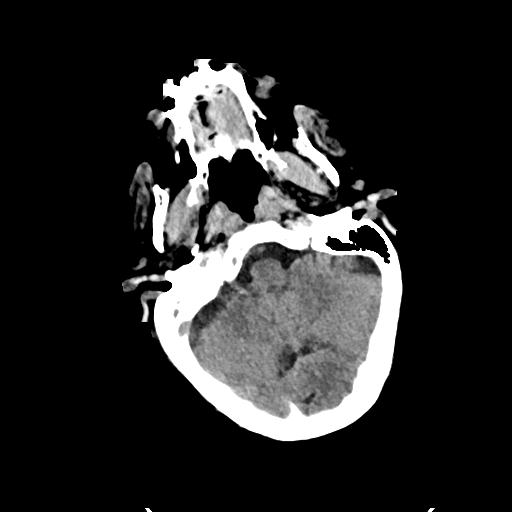
[im 13/35  brain]
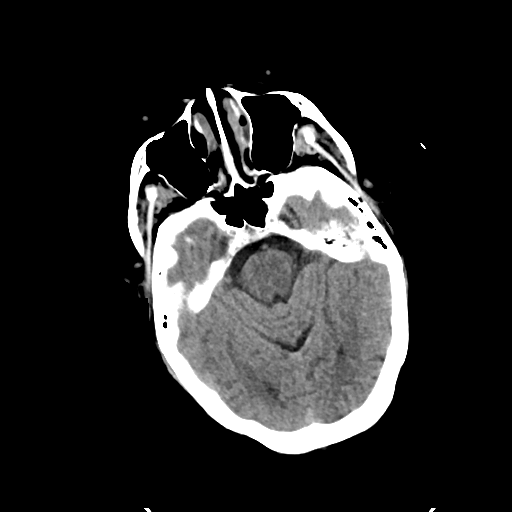
[im 18/35  brain]
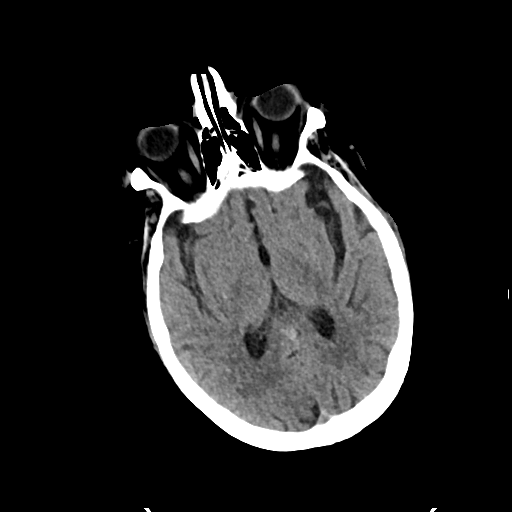
[im 22/35  brain]
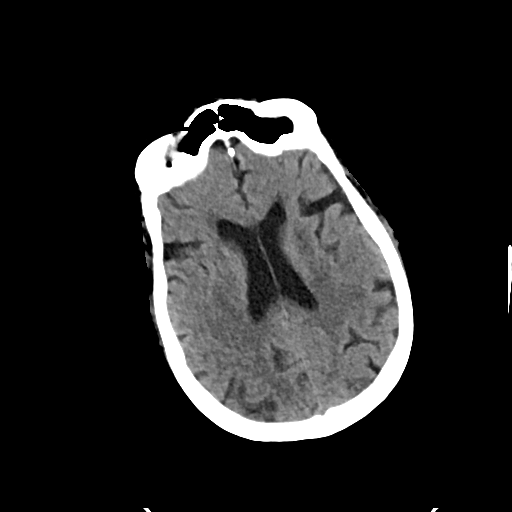
[im 22/35  bone]
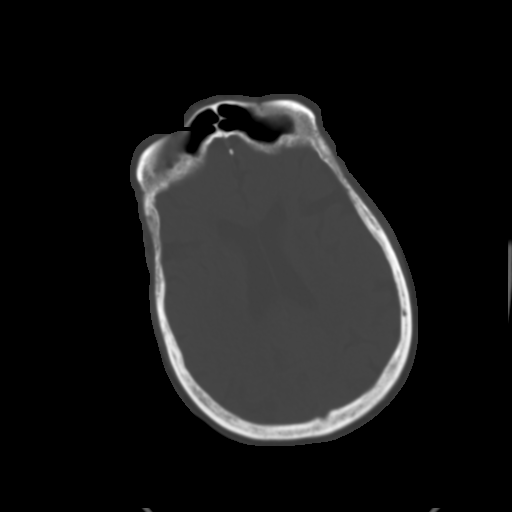
[im 26/35  brain]
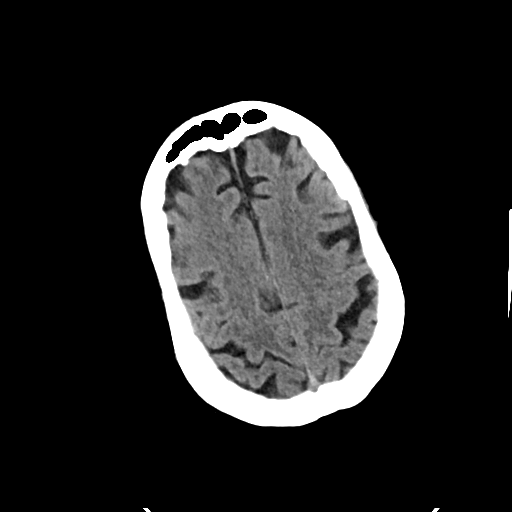
[im 30/35  brain]
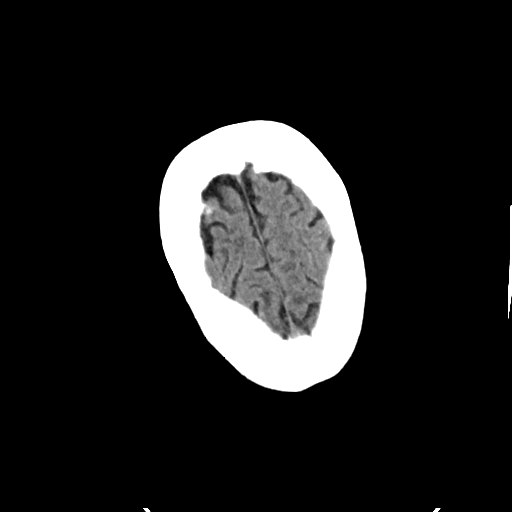

[Series 4: head bone · axial · 0.51mm/px · z∈[-143,-81]mm · 4 of 88 slices shown]
[im 9/88  bone]
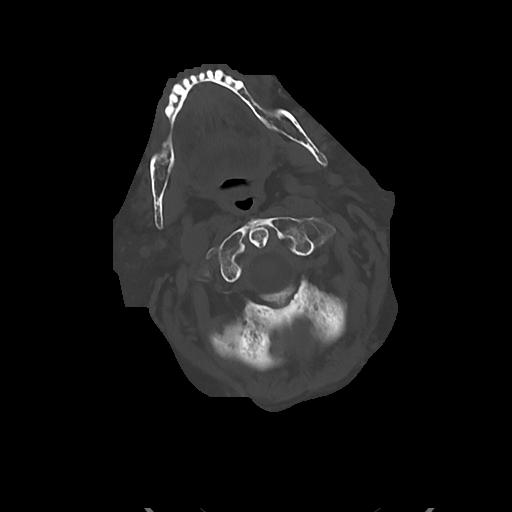
[im 18/88  bone]
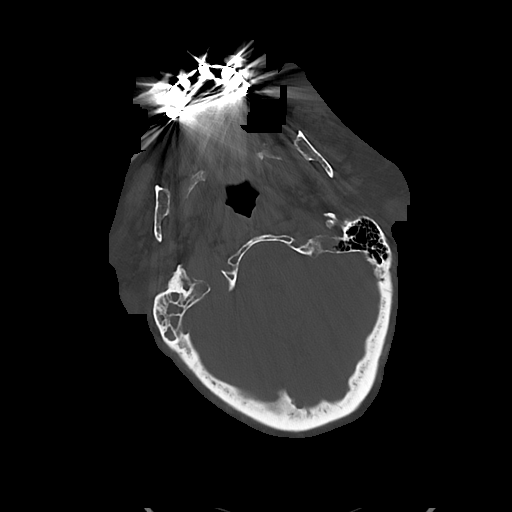
[im 27/88  bone]
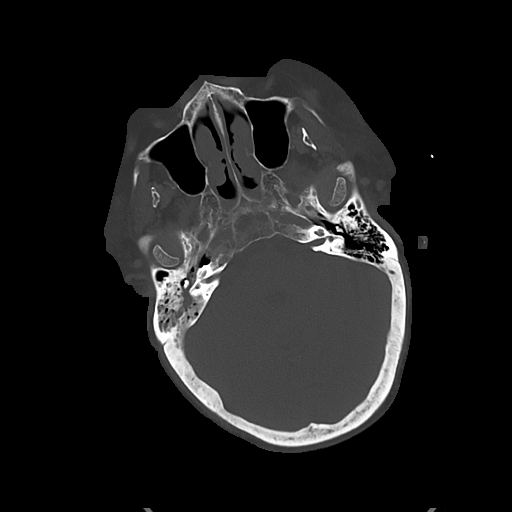
[im 40/88  bone]
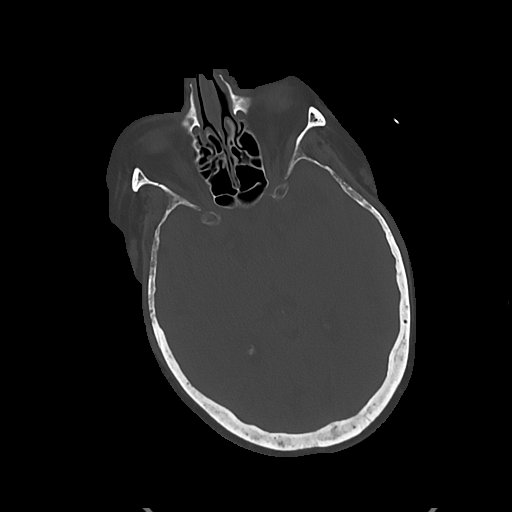

[Series 5: cor soft · coronal · 0.39mm/px · 3 of 81 slices shown]
[im 31/81  brain]
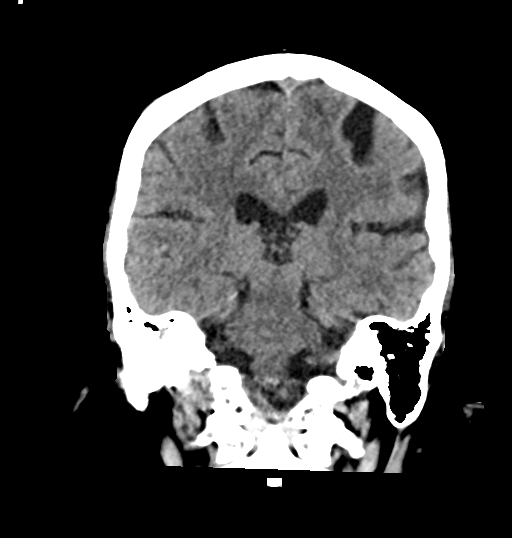
[im 37/81  brain]
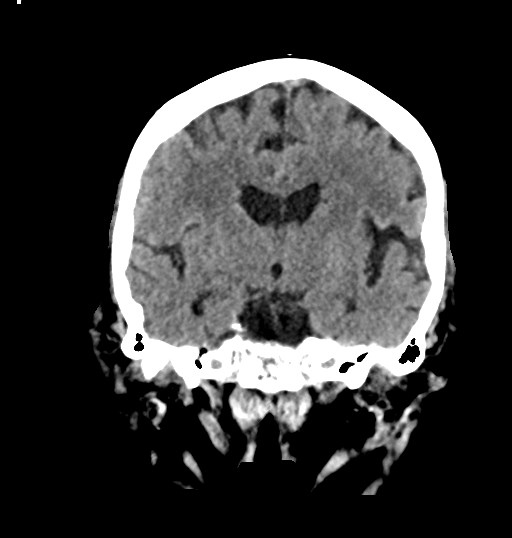
[im 44/81  brain]
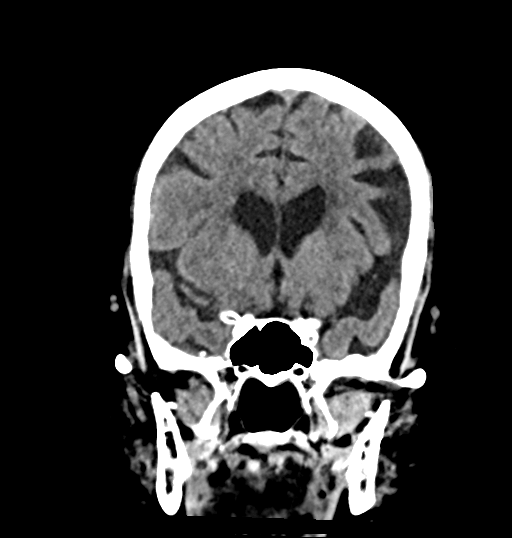

[Series 6: sag soft · sagittal · 0.41mm/px · 3 of 67 slices shown]
[im 23/67  brain]
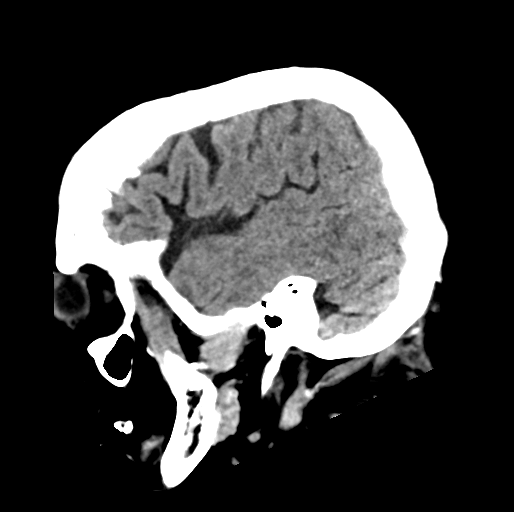
[im 34/67  brain]
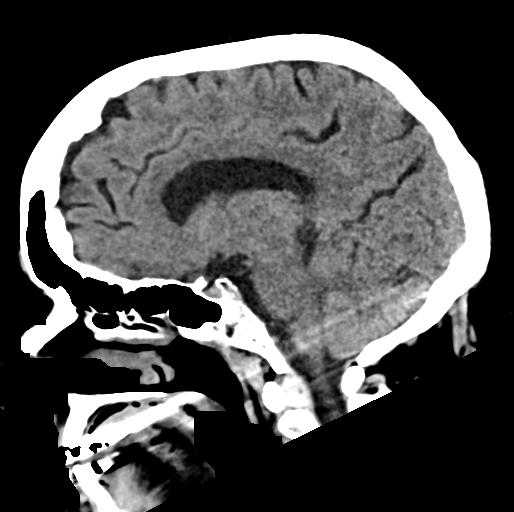
[im 45/67  brain]
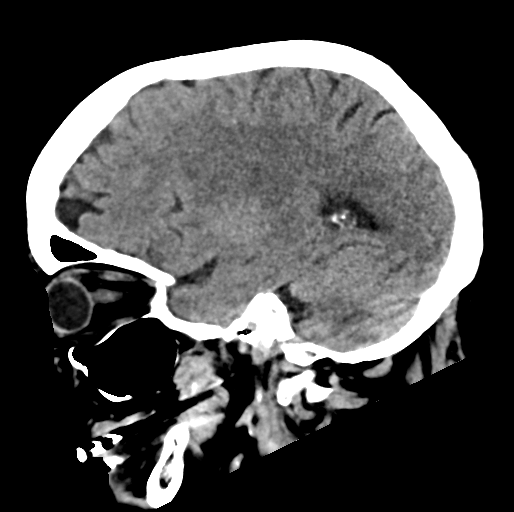

[17 of 47 positions shown; findings below may reference images not displayed]

FINDINGS: Brain: There is atrophy and chronic small vessel disease changes. No
acute intracranial abnormality. Specifically, no hemorrhage,
hydrocephalus, mass lesion, acute infarction, or significant
intracranial injury.

Vascular: No hyperdense vessel or unexpected calcification.

Skull: No acute calvarial abnormality.

Sinuses/Orbits: Visualized paranasal sinuses and mastoids clear.
Orbital soft tissues unremarkable.

Other: None
IMPRESSION: Atrophy, chronic microvascular disease.

No acute intracranial abnormality.

ADDENDUM:
Right mastoid effusion noted.

*** End of Addendum ***
FINDINGS: Brain: There is atrophy and chronic small vessel disease changes. No
acute intracranial abnormality. Specifically, no hemorrhage,
hydrocephalus, mass lesion, acute infarction, or significant
intracranial injury.

Vascular: No hyperdense vessel or unexpected calcification.

Skull: No acute calvarial abnormality.

Sinuses/Orbits: Visualized paranasal sinuses and mastoids clear.
Orbital soft tissues unremarkable.

Other: None
IMPRESSION: Atrophy, chronic microvascular disease.

No acute intracranial abnormality.

## 2020-11-25 IMAGING — CT CT CERVICAL SPINE W/O CM
3 of 4 series · 12 of 33 positions shown, 14 images · non-contrast
Comparison: 04/07/2020

CLINICAL DATA: Fall, neck trauma

EXAM:
CT CERVICAL SPINE WITHOUT CONTRAST
TECHNIQUE: Multidetector CT imaging of the cervical spine was performed without
intravenous contrast. Multiplanar CT image reconstructions were also
generated.

[Series 8: sag bone · sagittal · 0.29mm/px · 5 of 97 slices shown, 6 images]
[im 33/97  bone]
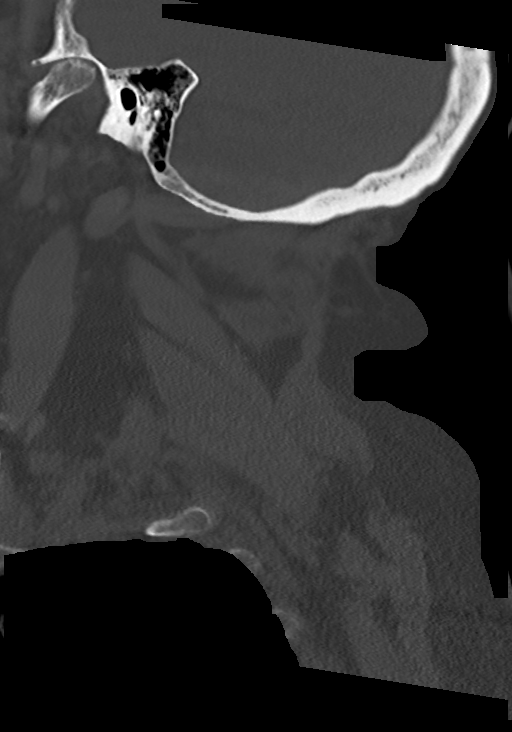
[im 41/97  bone]
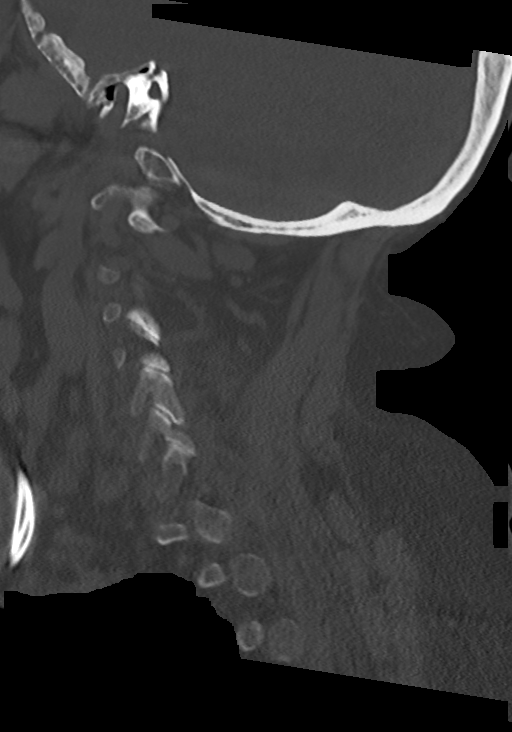
[im 49/97  soft-tissue]
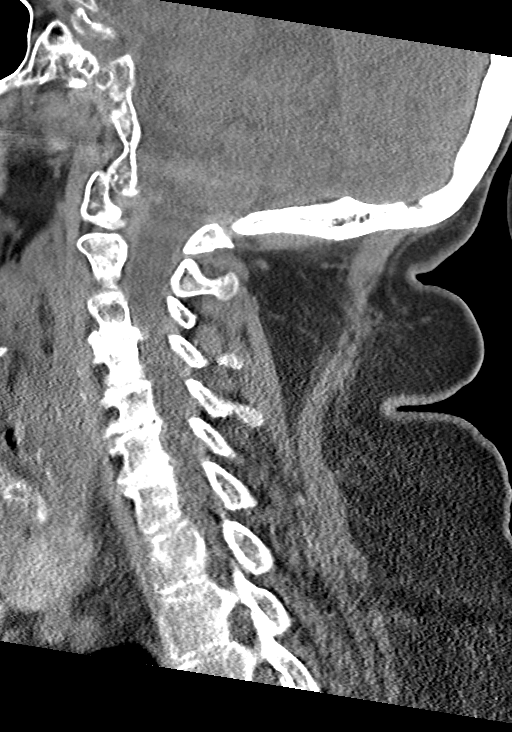
[im 49/97  bone]
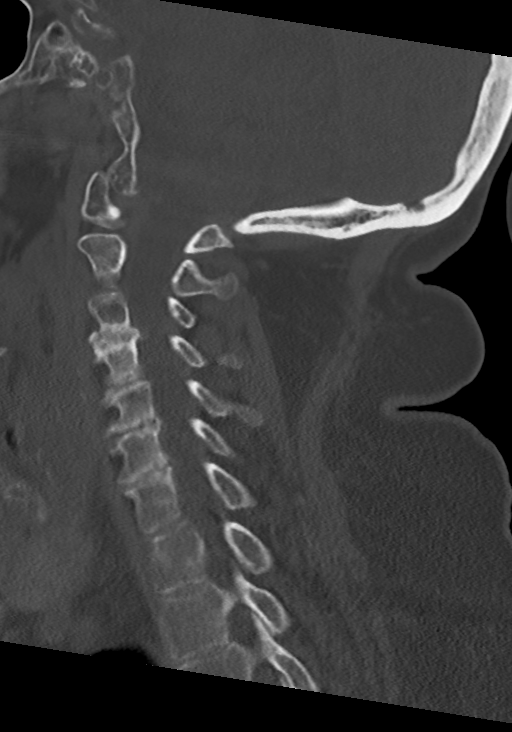
[im 57/97  bone]
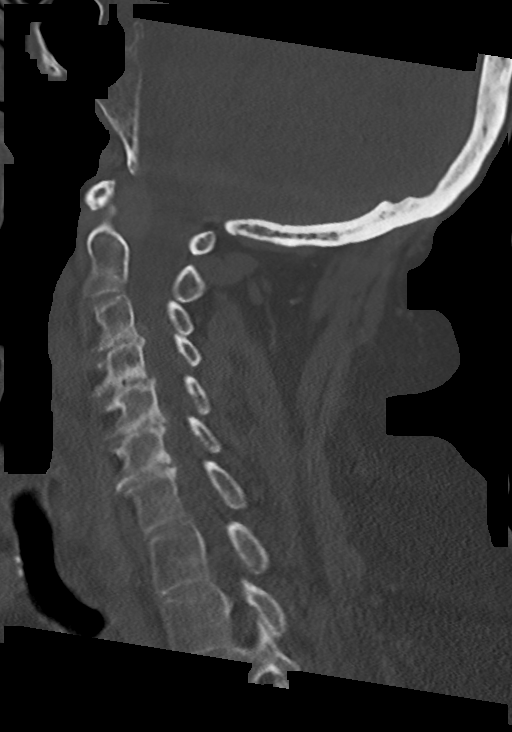
[im 65/97  bone]
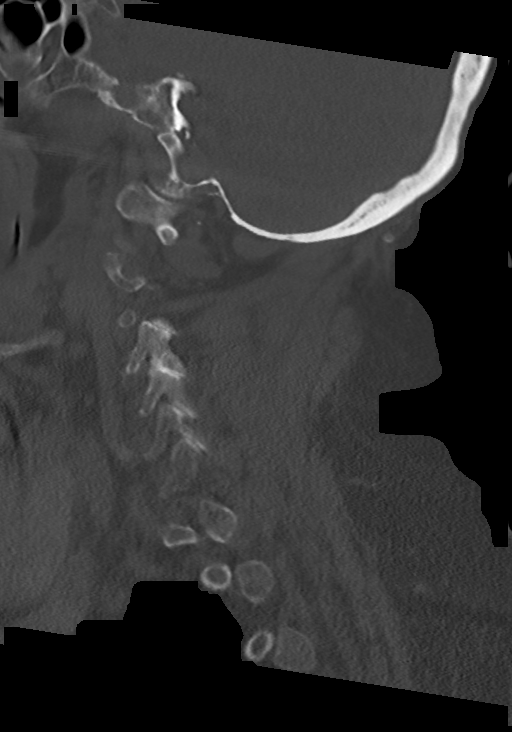

[Series 9: cor bone · coronal · 0.27mm/px · 3 of 61 slices shown]
[im 13/61  bone]
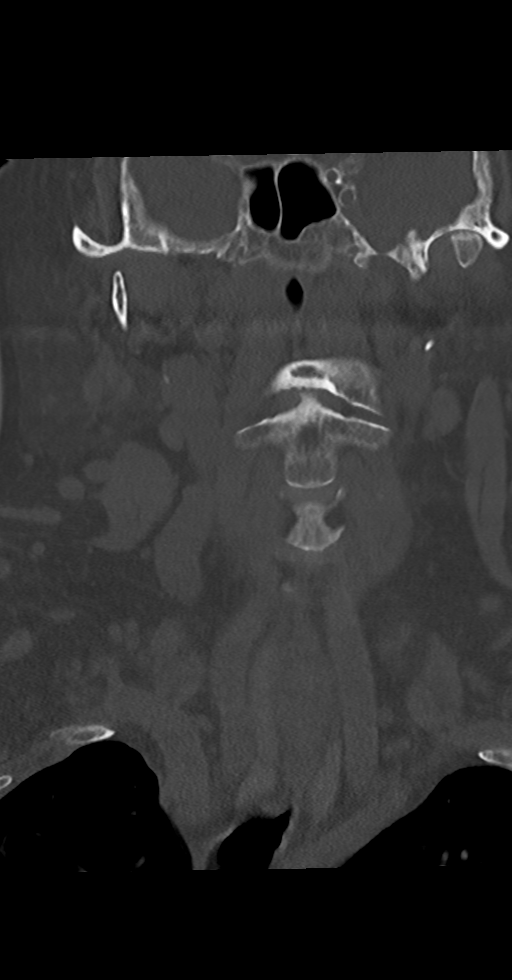
[im 25/61  bone]
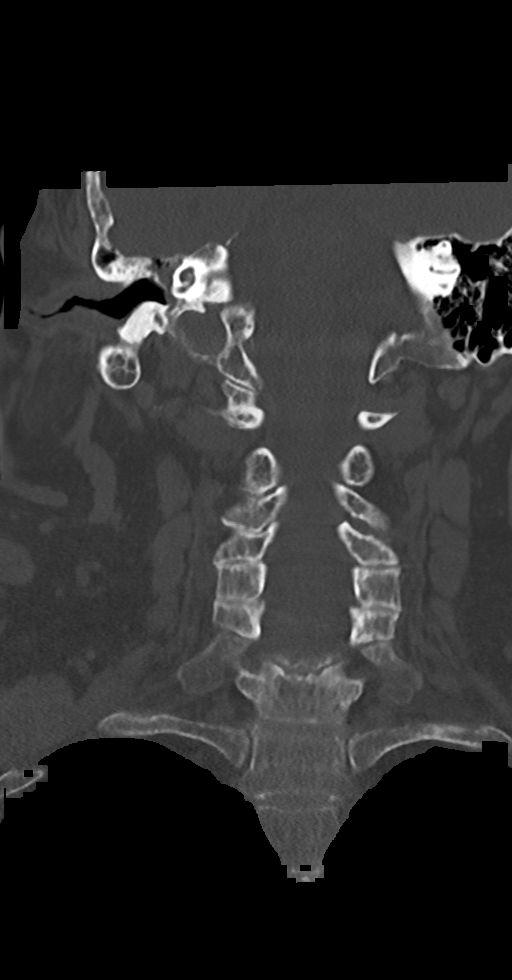
[im 37/61  bone]
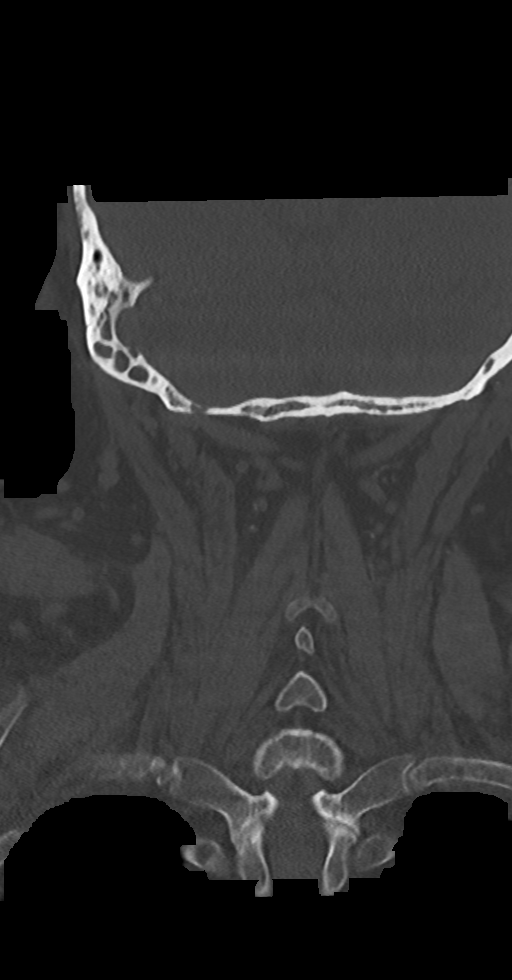

[Series 10: orthogonal axials · axial · 0.21mm/px · z∈[-261,-168]mm · 4 of 84 slices shown, 5 images]
[im 14/84  soft-tissue]
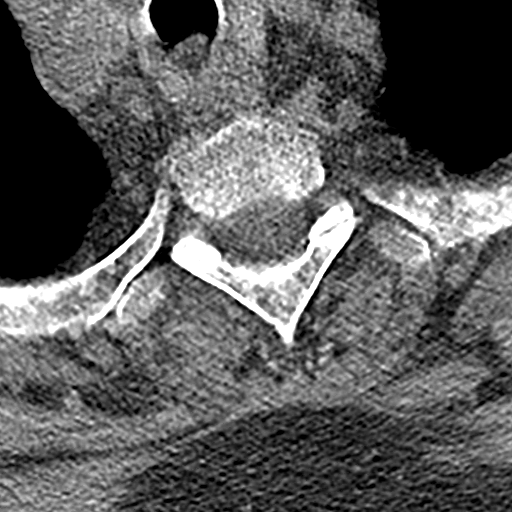
[im 14/84  bone]
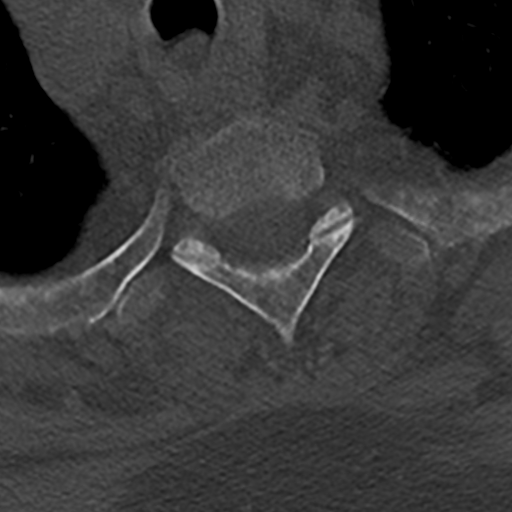
[im 28/84  bone]
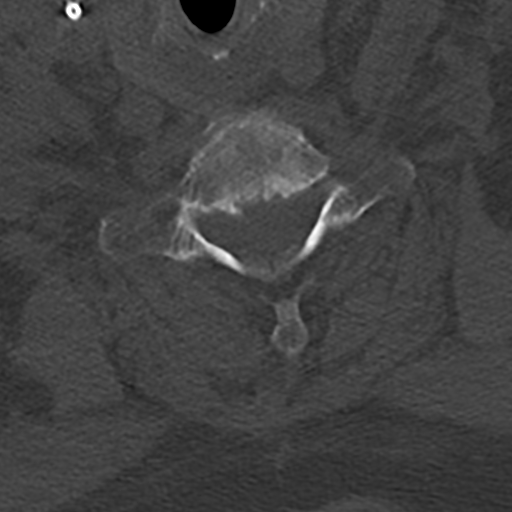
[im 56/84  bone]
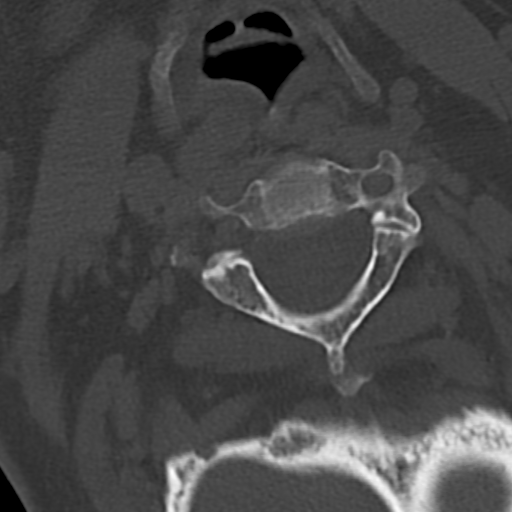
[im 70/84  bone]
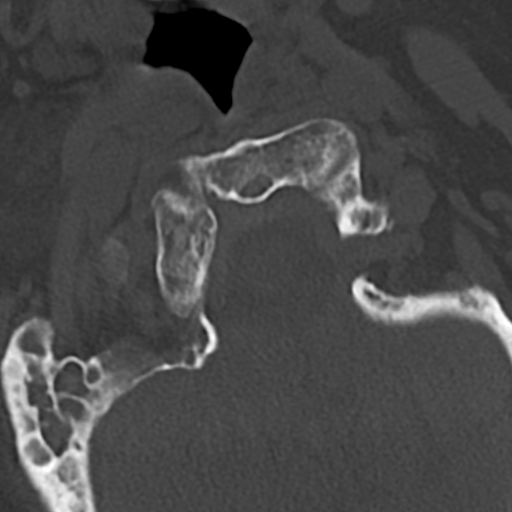

[12 of 33 positions shown; findings below may reference images not displayed]

FINDINGS: Alignment: Normal

Skull base and vertebrae: No acute fracture. No primary bone lesion
or focal pathologic process.

Soft tissues and spinal canal: No prevertebral fluid or swelling. No
visible canal hematoma.

Disc levels: Diffuse degenerative disc disease with disc space
narrowing and spurring. Diffuse degenerative facet disease
bilaterally.

Upper chest: No acute findings

Other: Fluid seen within the right mastoid air cells.
IMPRESSION: Diffuse degenerative disc and facet disease. No acute bony
abnormality.

Right mastoid effusion.

## 2020-11-25 IMAGING — CT CT ABD-PELV W/O CM
2 of 4 series · 16 of 46 positions shown, 18 images · non-contrast
Comparison: CT 07/17/2019

CLINICAL DATA: Abdominal trauma after fall.  Flank and lumbar pain

EXAM:
CT ABDOMEN AND PELVIS WITHOUT CONTRAST
TECHNIQUE: Multidetector CT imaging of the abdomen and pelvis was performed
following the standard protocol without IV contrast.

[Series 3: ap without · axial · non-contrast · 0.82mm/px · z∈[-777,-382]mm · 13 of 89 slices shown, 15 images]
[im 5/89  soft-tissue]
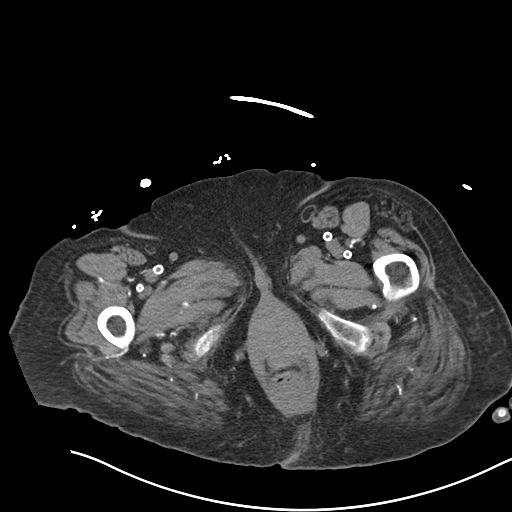
[im 5/89  bone]
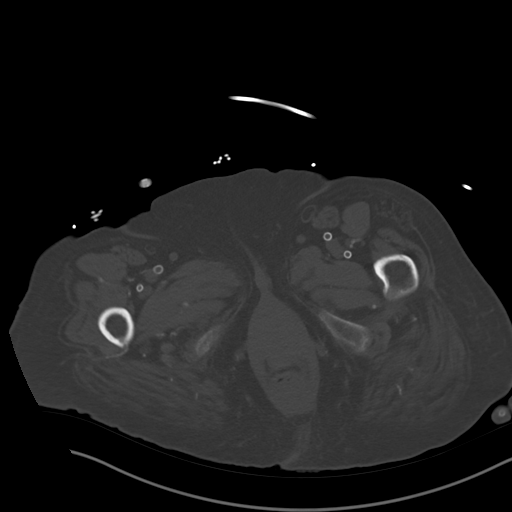
[im 13/89  soft-tissue]
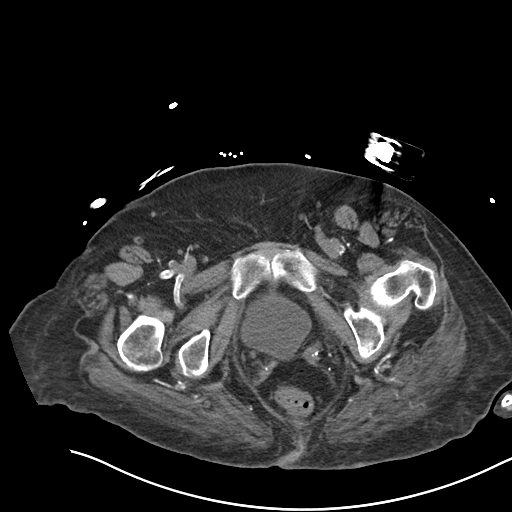
[im 17/89  soft-tissue]
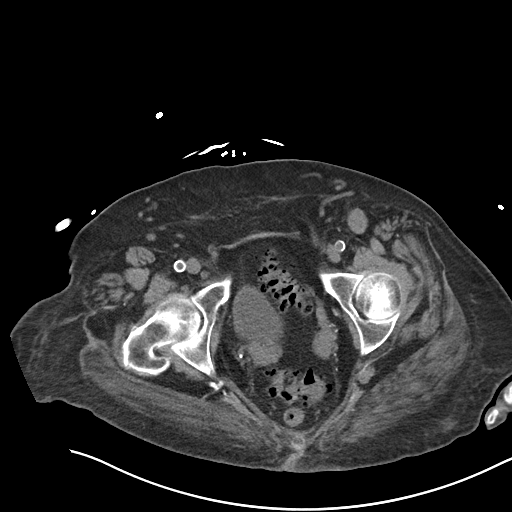
[im 26/89  soft-tissue]
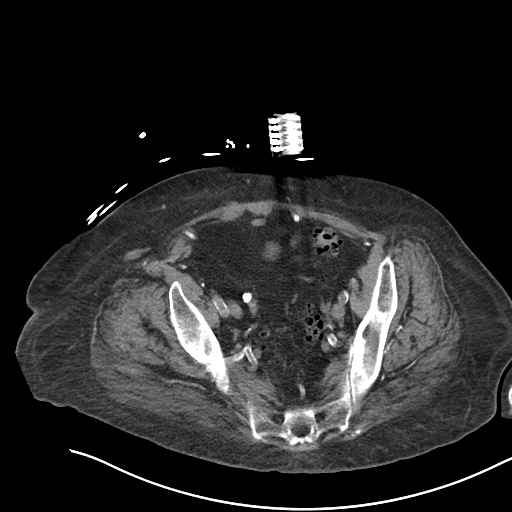
[im 30/89  soft-tissue]
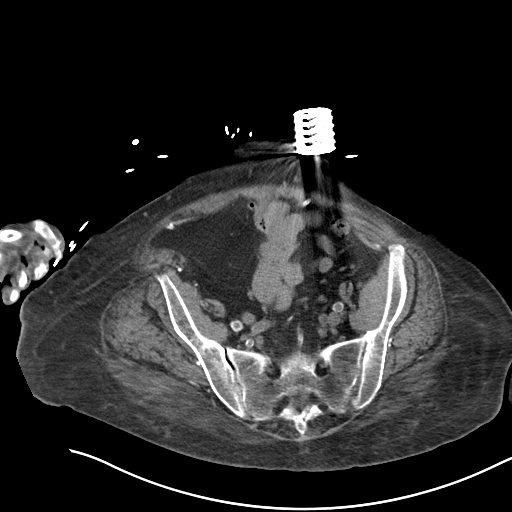
[im 38/89  soft-tissue]
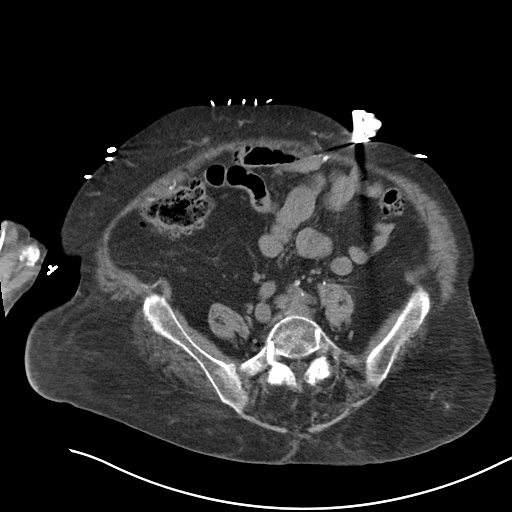
[im 47/89  soft-tissue]
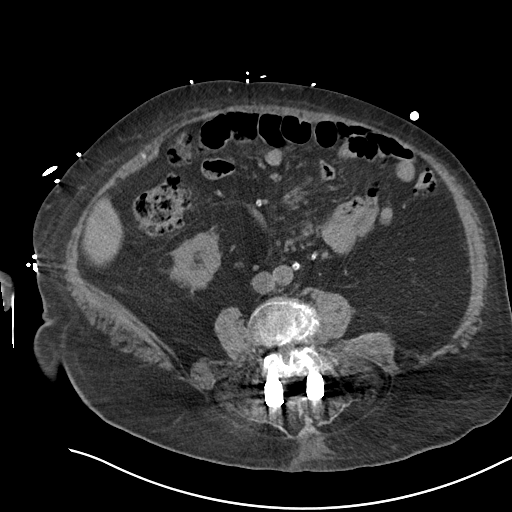
[im 51/89  soft-tissue]
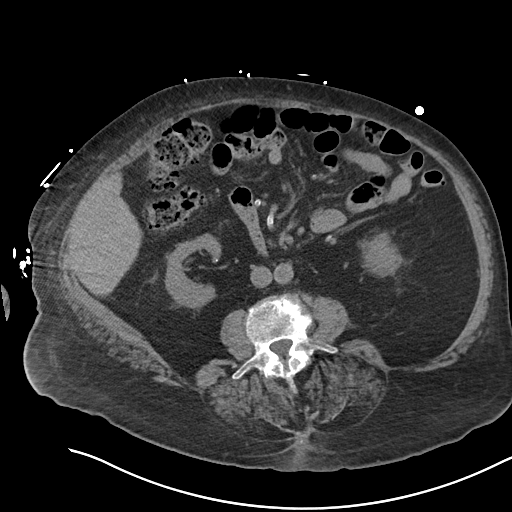
[im 59/89  soft-tissue]
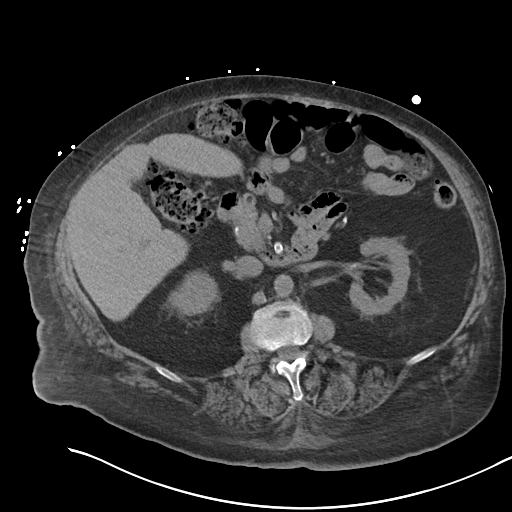
[im 59/89  bone]
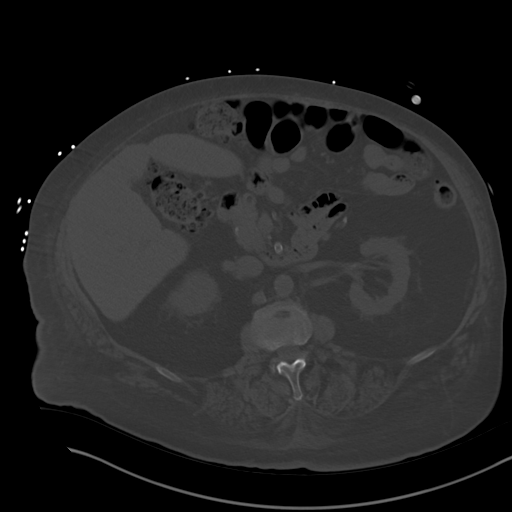
[im 63/89  soft-tissue]
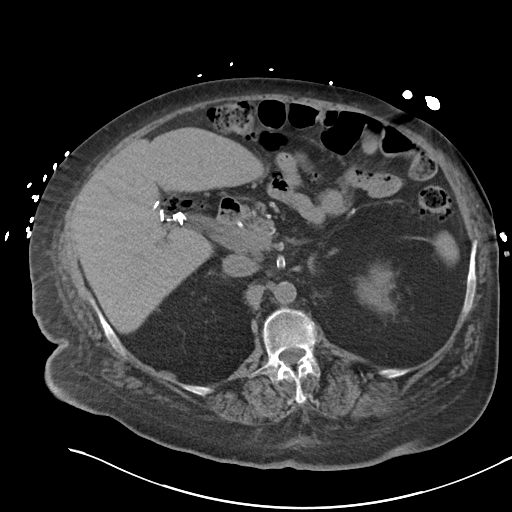
[im 72/89  soft-tissue]
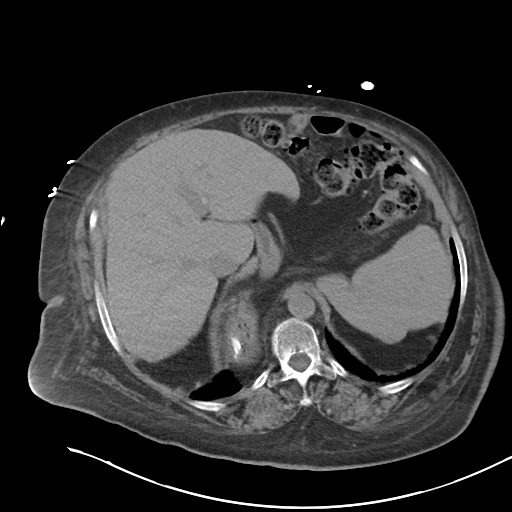
[im 76/89  soft-tissue]
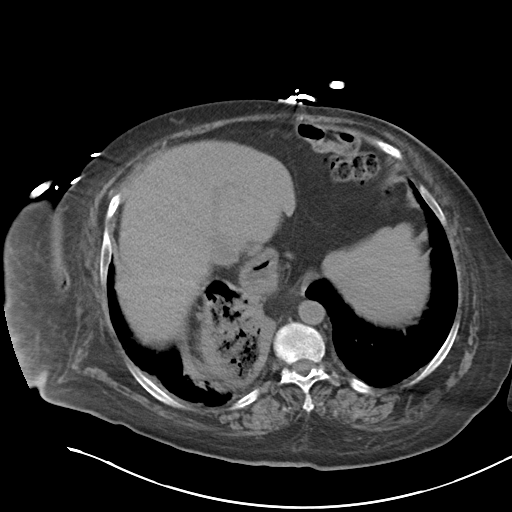
[im 84/89  soft-tissue]
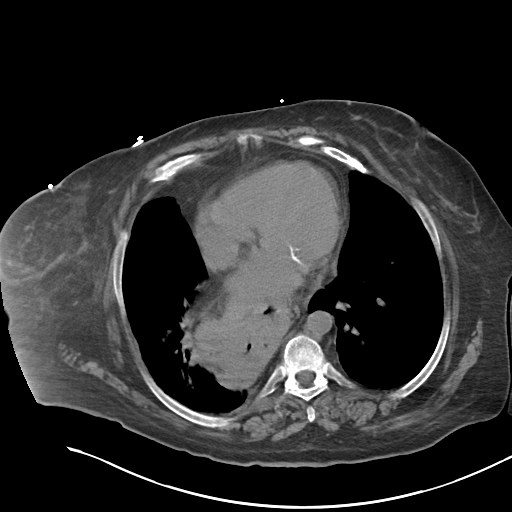

[Series 6: cor · coronal · 0.92mm/px · 3 of 112 slices shown]
[im 38/112  soft-tissue]
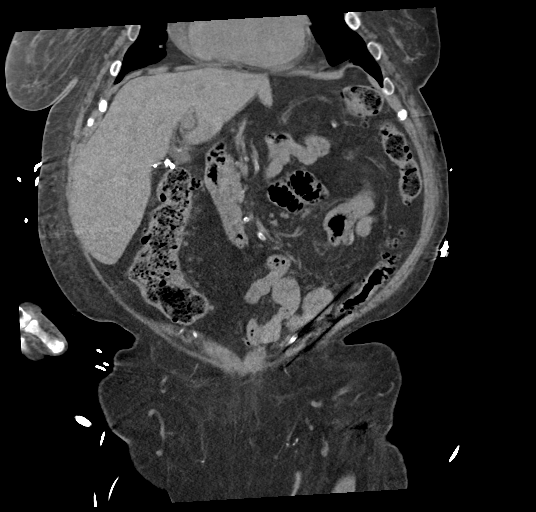
[im 50/112  soft-tissue]
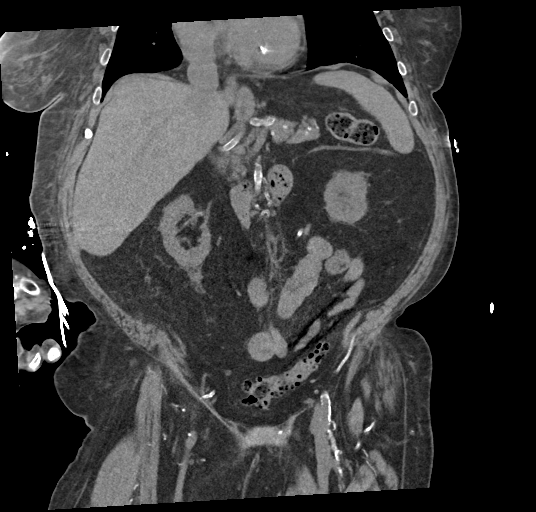
[im 62/112  soft-tissue]
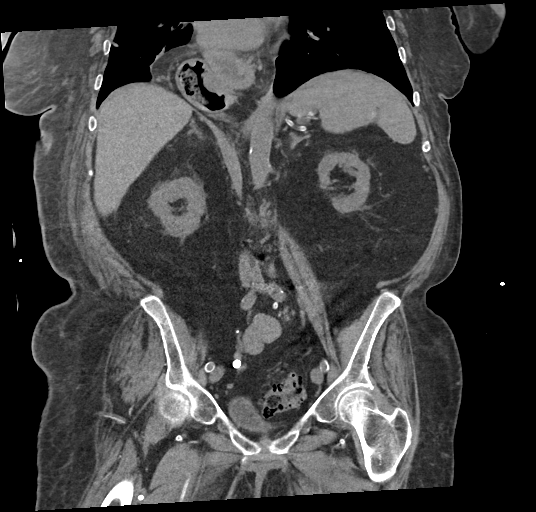

[16 of 46 positions shown; findings below may reference images not displayed]

FINDINGS: Lower chest: Bibasilar atelectasis, right worse than left. 4 mm
right middle lobe pulmonary nodule and 3 mm right lower lobe
pulmonary nodule (series 5, images 11 and 27). These findings are
both stable from the prior study. Stable mild cardiomegaly. Large
hiatal hernia.

Hepatobiliary: No focal liver abnormality is identified on
noncontrast imaging. Prior cholecystectomy. No biliary dilatation.

Pancreas: Grossly unremarkable.

Spleen: Normal in size without focal abnormality.

Adrenals/Urinary Tract: Unremarkable adrenal glands. Bilateral
kidneys are mildly atrophic with chronic mild perinephric stranding,
nonspecific. No renal stone or hydronephrosis. Urinary bladder is
unremarkable for the degree of distension.

Stomach/Bowel: Again noted is a large hiatal hernia with the
majority of the stomach located within the chest. No evidence of
outlet obstruction. No dilated loops of bowel. Extensive
diverticulosis throughout the colon. No focal colonic thickening or
pericolonic inflammatory changes. A normal appendix is present
within the right lower quadrant (series 3, image 66).

Vascular/Lymphatic: Extensive atherosclerosis. No aneurysm. No
abdominopelvic lymphadenopathy.

Reproductive: Status post hysterectomy. No adnexal masses.

Other: No free fluid. No abdominopelvic fluid collection. No
pneumoperitoneum. No abdominal wall hernia.

Musculoskeletal: Healed fracture of the lateral aspect of the left
eighth rib. Prior fusion of L4-5. No acute osseous findings. No soft
tissue fluid collection or hematoma.
IMPRESSION: 1. No acute findings in the abdomen or pelvis.
2. Large hiatal hernia.
3. Extensive colonic diverticulosis without evidence of acute
diverticulitis.
4. Aortic atherosclerosis. ([id number]).

## 2020-11-28 ENCOUNTER — Encounter (HOSPITAL_COMMUNITY): Payer: Self-pay | Admitting: Vascular Surgery

## 2020-11-28 ENCOUNTER — Other Ambulatory Visit: Payer: Self-pay

## 2020-11-28 ENCOUNTER — Other Ambulatory Visit (HOSPITAL_COMMUNITY)
Admission: RE | Admit: 2020-11-28 | Discharge: 2020-11-28 | Disposition: A | Discharge status: Home / Self Care | Payer: Medicare Other | Source: Ambulatory Visit | Attending: Vascular Surgery | Admitting: Vascular Surgery

## 2020-11-28 DIAGNOSIS — Z20822 Contact with and (suspected) exposure to covid-19: Secondary | ICD-10-CM | POA: Insufficient documentation

## 2020-11-28 DIAGNOSIS — Z01812 Encounter for preprocedural laboratory examination: Secondary | ICD-10-CM | POA: Diagnosis present

## 2020-11-28 NOTE — Progress Notes (Addendum)
[name] denies chest pain or shortness of breath. Patient was tested for Covid and has been in quarantine since that time.  Patient has type II diabetes, patient reports that CBG runs 100-110. I instructed patient to check CBG after awaking and every 2 hours until arrival  to the hospital.  I Instructed patient if CBG is less than 70 to drink 1/2 cup of a clear juice. Recheck CBG in 15 minutes if CBG is not over 70 call, pre- op desk at [phone] for further instructions.

## 2020-11-29 ENCOUNTER — Encounter (HOSPITAL_COMMUNITY)
Admission: RE | Disposition: A | Discharge status: Home / Self Care | Payer: Self-pay | Source: Home / Self Care | Attending: Vascular Surgery

## 2020-11-29 ENCOUNTER — Encounter (HOSPITAL_COMMUNITY): Payer: Self-pay | Admitting: Vascular Surgery

## 2020-11-29 ENCOUNTER — Ambulatory Visit (HOSPITAL_COMMUNITY): Payer: Medicare Other | Admitting: Certified Registered"

## 2020-11-29 ENCOUNTER — Ambulatory Visit (HOSPITAL_COMMUNITY)
Admission: RE | Admit: 2020-11-29 | Discharge: 2020-11-29 | Disposition: A | Discharge status: Home / Self Care | Payer: Medicare Other | Attending: Vascular Surgery | Admitting: Vascular Surgery

## 2020-11-29 DIAGNOSIS — N186 End stage renal disease: Secondary | ICD-10-CM | POA: Insufficient documentation

## 2020-11-29 DIAGNOSIS — Z992 Dependence on renal dialysis: Secondary | ICD-10-CM | POA: Insufficient documentation

## 2020-11-29 DIAGNOSIS — N185 Chronic kidney disease, stage 5: Secondary | ICD-10-CM

## 2020-11-29 DIAGNOSIS — Z452 Encounter for adjustment and management of vascular access device: Secondary | ICD-10-CM | POA: Insufficient documentation

## 2020-11-29 HISTORY — PX: PORT-A-CATH REMOVAL: SHX5289

## 2020-11-29 HISTORY — PX: AV FISTULA PLACEMENT: SHX1204

## 2020-11-29 LAB — POCT I-STAT, CHEM 8
BUN: 27 mg/dL — ABNORMAL HIGH (ref 8–23)
Calcium, Ion: 0.83 mmol/L — CL (ref 1.15–1.40)
Chloride: 104 mmol/L (ref 98–111)
Creatinine, Ser: 5.8 mg/dL — ABNORMAL HIGH (ref 0.44–1.00)
Glucose, Bld: 113 mg/dL — ABNORMAL HIGH (ref 70–99)
HCT: 37 % (ref 36.0–46.0)
Hemoglobin: 12.6 g/dL (ref 12.0–15.0)
Potassium: 5.4 mmol/L — ABNORMAL HIGH (ref 3.5–5.1)
Sodium: 133 mmol/L — ABNORMAL LOW (ref 135–145)
TCO2: 24 mmol/L (ref 22–32)

## 2020-11-29 LAB — GLUCOSE, CAPILLARY
Glucose-Capillary: 109 mg/dL — ABNORMAL HIGH (ref 70–99)
Glucose-Capillary: 137 mg/dL — ABNORMAL HIGH (ref 70–99)

## 2020-11-29 LAB — SARS CORONAVIRUS 2 (TAT 6-24 HRS): SARS Coronavirus 2: NEGATIVE

## 2020-11-29 SURGERY — REMOVAL PORT-A-CATH
Anesthesia: General | Laterality: Right

## 2020-11-29 MED ORDER — CHLORHEXIDINE GLUCONATE 4 % EX LIQD: 60.0000 mL | Freq: Once | CUTANEOUS | Status: DC

## 2020-11-29 MED ORDER — ONDANSETRON HCL 4 MG/2ML IJ SOLN: 4.0000 mg | Freq: Once | INTRAMUSCULAR | Status: DC | PRN

## 2020-11-29 MED ORDER — OXYCODONE-ACETAMINOPHEN 5-325 MG PO TABS: 1.0000 | ORAL_TABLET | Freq: Four times a day (QID) | ORAL | 0 refills | Status: DC | PRN

## 2020-11-29 MED ORDER — PHENYLEPHRINE HCL-NACL 10-0.9 MG/250ML-% IV SOLN
INTRAVENOUS | Status: DC | PRN
  Administered 2020-11-29: 30 ug/min via INTRAVENOUS

## 2020-11-29 MED ORDER — CHLORHEXIDINE GLUCONATE 0.12 % MT SOLN: 15.0000 mL | Freq: Once | OROMUCOSAL | Status: AC

## 2020-11-29 MED ORDER — LIDOCAINE-EPINEPHRINE 0.5 %-1:200000 IJ SOLN
INTRAMUSCULAR | Status: DC | PRN
  Administered 2020-11-29: 20 mL

## 2020-11-29 MED ORDER — CALCIUM CHLORIDE 10 % IV SOLN
0.5000 g | Freq: Once | INTRAVENOUS | Status: AC
  Administered 2020-11-29: 0.5 g via INTRAVENOUS

## 2020-11-29 MED ORDER — ONDANSETRON HCL 4 MG/2ML IJ SOLN
INTRAMUSCULAR | Status: DC | PRN
  Administered 2020-11-29: 4 mg via INTRAVENOUS

## 2020-11-29 MED ORDER — PROPOFOL 500 MG/50ML IV EMUL
INTRAVENOUS | Status: DC | PRN
  Administered 2020-11-29: 100 ug/kg/min via INTRAVENOUS

## 2020-11-29 MED ORDER — CEFAZOLIN SODIUM-DEXTROSE 2-4 GM/100ML-% IV SOLN
2.0000 g | INTRAVENOUS | Status: AC
  Administered 2020-11-29: 2 g via INTRAVENOUS

## 2020-11-29 MED ORDER — SODIUM CHLORIDE 0.9 % IV SOLN
INTRAVENOUS | Status: AC
  Filled 2020-11-29: qty 1.2

## 2020-11-29 MED ORDER — DEXAMETHASONE SODIUM PHOSPHATE 10 MG/ML IJ SOLN
INTRAMUSCULAR | Status: DC | PRN
  Administered 2020-11-29: 10 mg via INTRAVENOUS

## 2020-11-29 MED ORDER — EPHEDRINE SULFATE-NACL 50-0.9 MG/10ML-% IV SOSY
PREFILLED_SYRINGE | INTRAVENOUS | Status: DC | PRN
  Administered 2020-11-29 (×3): 10 mg via INTRAVENOUS

## 2020-11-29 MED ORDER — 0.9 % SODIUM CHLORIDE (POUR BTL) OPTIME
TOPICAL | Status: DC | PRN
  Administered 2020-11-29: 1000 mL

## 2020-11-29 MED ORDER — OXYCODONE HCL 5 MG/5ML PO SOLN: 5.0000 mg | Freq: Once | ORAL | Status: DC | PRN

## 2020-11-29 MED ORDER — SODIUM CHLORIDE 0.9 % IV SOLN: INTRAVENOUS | Status: DC | PRN

## 2020-11-29 MED ORDER — SODIUM CHLORIDE 0.9 % IV SOLN: INTRAVENOUS | Status: DC

## 2020-11-29 MED ORDER — PHENYLEPHRINE HCL (PRESSORS) 10 MG/ML IV SOLN
INTRAVENOUS | Status: AC
  Filled 2020-11-29: qty 2

## 2020-11-29 MED ORDER — CEFAZOLIN SODIUM-DEXTROSE 2-4 GM/100ML-% IV SOLN
INTRAVENOUS | Status: AC
  Filled 2020-11-29: qty 100

## 2020-11-29 MED ORDER — LIDOCAINE 2% (20 MG/ML) 5 ML SYRINGE
INTRAMUSCULAR | Status: AC
  Filled 2020-11-29: qty 5

## 2020-11-29 MED ORDER — FENTANYL CITRATE (PF) 100 MCG/2ML IJ SOLN: 25.0000 ug | INTRAMUSCULAR | Status: DC | PRN

## 2020-11-29 MED ORDER — MIDAZOLAM HCL 2 MG/2ML IJ SOLN
INTRAMUSCULAR | Status: AC
  Filled 2020-11-29: qty 2

## 2020-11-29 MED ORDER — OXYCODONE HCL 5 MG PO TABS: 5.0000 mg | ORAL_TABLET | Freq: Once | ORAL | Status: DC | PRN

## 2020-11-29 MED ORDER — DEXAMETHASONE SODIUM PHOSPHATE 10 MG/ML IJ SOLN
INTRAMUSCULAR | Status: AC
  Filled 2020-11-29: qty 1

## 2020-11-29 MED ORDER — CALCIUM CHLORIDE 10 % IV SOLN
INTRAVENOUS | Status: AC
  Filled 2020-11-29: qty 10

## 2020-11-29 MED ORDER — ORAL CARE MOUTH RINSE: 15.0000 mL | Freq: Once | OROMUCOSAL | Status: AC

## 2020-11-29 MED ORDER — ALBUMIN HUMAN 5 % IV SOLN
12.5000 g | Freq: Once | INTRAVENOUS | Status: AC
  Administered 2020-11-29: 12.5 g via INTRAVENOUS

## 2020-11-29 MED ORDER — PROPOFOL 10 MG/ML IV BOLUS
INTRAVENOUS | Status: AC
  Filled 2020-11-29: qty 20

## 2020-11-29 MED ORDER — ONDANSETRON HCL 4 MG/2ML IJ SOLN
INTRAMUSCULAR | Status: AC
  Filled 2020-11-29: qty 2

## 2020-11-29 MED ORDER — MIDAZOLAM HCL 2 MG/2ML IJ SOLN
INTRAMUSCULAR | Status: DC | PRN
  Administered 2020-11-29: 2 mg via INTRAVENOUS

## 2020-11-29 MED ORDER — LIDOCAINE 2% (20 MG/ML) 5 ML SYRINGE
INTRAMUSCULAR | Status: DC | PRN
  Administered 2020-11-29: 80 mg via INTRAVENOUS

## 2020-11-29 MED ORDER — LIDOCAINE HCL (PF) 0.5 % IJ SOLN
INTRAMUSCULAR | Status: AC
  Filled 2020-11-29: qty 50

## 2020-11-29 MED ORDER — CHLORHEXIDINE GLUCONATE 0.12 % MT SOLN
OROMUCOSAL | Status: AC
  Administered 2020-11-29: 15 mL via OROMUCOSAL
  Filled 2020-11-29: qty 15

## 2020-11-29 SURGICAL SUPPLY — 33 items
ADH SKN CLS APL DERMABOND .7 (GAUZE/BANDAGES/DRESSINGS) ×2
ARMBAND PINK RESTRICT EXTREMIT (MISCELLANEOUS) ×4 IMPLANT
CANISTER SUCT 3000ML PPV (MISCELLANEOUS) ×2 IMPLANT
CANNULA VESSEL 3MM 2 BLNT TIP (CANNULA) ×2 IMPLANT
CLIP LIGATING EXTRA MED SLVR (CLIP) ×2 IMPLANT
CLIP LIGATING EXTRA SM BLUE (MISCELLANEOUS) ×2 IMPLANT
COVER ULTRASOUND PROBE 36 ST (MISCELLANEOUS) ×1 IMPLANT
COVER WAND RF STERILE (DRAPES) ×2 IMPLANT
DECANTER SPIKE VIAL GLASS SM (MISCELLANEOUS) ×3 IMPLANT
DERMABOND ADVANCED (GAUZE/BANDAGES/DRESSINGS) ×2
DERMABOND ADVANCED .7 DNX12 (GAUZE/BANDAGES/DRESSINGS) ×1 IMPLANT
DRAPE UTILITY 15X26 TOWEL STRL (DRAPES) ×1 IMPLANT
ELECT REM PT RETURN 9FT ADLT (ELECTROSURGICAL) ×2
ELECTRODE REM PT RTRN 9FT ADLT (ELECTROSURGICAL) ×1 IMPLANT
GAUZE SPONGE 4X4 12PLY STRL (GAUZE/BANDAGES/DRESSINGS) ×2 IMPLANT
GLOVE SS BIOGEL STRL SZ 7.5 (GLOVE) ×1 IMPLANT
GLOVE SUPERSENSE BIOGEL SZ 7.5 (GLOVE) ×1
GLOVE SURG UNDER POLY LF SZ6.5 (GLOVE) ×5 IMPLANT
GOWN STRL REUS W/ TWL LRG LVL3 (GOWN DISPOSABLE) ×3 IMPLANT
GOWN STRL REUS W/TWL LRG LVL3 (GOWN DISPOSABLE) ×8
KIT BASIN OR (CUSTOM PROCEDURE TRAY) ×2 IMPLANT
KIT TURNOVER KIT B (KITS) ×2 IMPLANT
NS IRRIG 1000ML POUR BTL (IV SOLUTION) ×2 IMPLANT
PACK CV ACCESS (CUSTOM PROCEDURE TRAY) ×2 IMPLANT
PAD ARMBOARD 7.5X6 YLW CONV (MISCELLANEOUS) ×4 IMPLANT
SUT PROLENE 6 0 CC (SUTURE) ×4 IMPLANT
SUT SILK 2 0 PERMA HAND 18 BK (SUTURE) IMPLANT
SUT VIC AB 3-0 SH 27 (SUTURE) ×4
SUT VIC AB 3-0 SH 27X BRD (SUTURE) ×2 IMPLANT
SYR TOOMEY 50ML (SYRINGE) IMPLANT
TOWEL GREEN STERILE (TOWEL DISPOSABLE) ×2 IMPLANT
UNDERPAD 30X36 HEAVY ABSORB (UNDERPADS AND DIAPERS) ×2 IMPLANT
WATER STERILE IRR 1000ML POUR (IV SOLUTION) ×2 IMPLANT

## 2020-11-29 NOTE — Anesthesia Preprocedure Evaluation (Signed)
Anesthesia Evaluation  Patient identified by MRN, date of birth, ID band Patient awake    Reviewed: Allergy & Precautions, NPO status , Patient's Chart, lab work & pertinent test results  Airway Mallampati: III  TM Distance: >3 FB     Dental  (+) Edentulous Lower, Dental Advisory Given   Pulmonary former smoker,    breath sounds clear to auscultation + decreased breath sounds      Cardiovascular hypertension,  Rhythm:Regular Rate:Normal     Neuro/Psych    GI/Hepatic   Endo/Other  diabetes  Renal/GU      Musculoskeletal   Abdominal (+) + obese,   Peds  Hematology   Anesthesia Other Findings   Reproductive/Obstetrics                             Anesthesia Physical Anesthesia Plan  ASA: III  Anesthesia Plan: General   Post-op Pain Management:    Induction: Intravenous  PONV Risk Score and Plan: Ondansetron and Dexamethasone  Airway Management Planned: LMA  Additional Equipment:   Intra-op Plan:   Post-operative Plan:   Informed Consent: I have reviewed the patients History and Physical, chart, labs and discussed the procedure including the risks, benefits and alternatives for the proposed anesthesia with the patient or authorized representative who has indicated his/her understanding and acceptance.     Dental advisory given  Plan Discussed with: CRNA and Anesthesiologist  Anesthesia Plan Comments:         Anesthesia Quick Evaluation

## 2020-11-29 NOTE — Discharge Instructions (Signed)
[facility]  Discharge Instructions  AV Fistula or Graft Surgery for Dialysis Access  Please refer to the following instructions for your post-procedure care. Your surgeon or physician assistant will discuss any changes with you.  Activity  You may drive the day following your surgery, if you are comfortable and no longer taking prescription pain medication. Resume full activity as the soreness in your incision resolves.  Bathing/Showering  You may shower after you go home. Keep your incision dry for 48 hours. Do not soak in a bathtub, hot tub, or swim until the incision heals completely. You may not shower if you have a hemodialysis catheter.  Incision Care  Clean your incision with mild soap and water after 48 hours. Pat the area dry with a clean towel. You do not need a bandage unless otherwise instructed. Do not apply any ointments or creams to your incision. You may have skin glue on your incision. Do not peel it off. It will come off on its own in about one week. Your arm may swell a bit after surgery. To reduce swelling use pillows to elevate your arm so it is above your heart. Your doctor will tell you if you need to lightly wrap your arm with an ACE bandage.  Diet  Resume your normal diet. There are not special food restrictions following this procedure. In order to heal from your surgery, it is CRITICAL to get adequate nutrition. Your body requires vitamins, minerals, and protein. Vegetables are the best source of vitamins and minerals. Vegetables also provide the perfect balance of protein. Processed food has little nutritional value, so try to avoid this.  Medications  Resume taking all of your medications. If your incision is causing pain, you may take over-the counter pain relievers such as acetaminophen (Tylenol). If you were prescribed a stronger pain medication, please be aware these medications can cause nausea and constipation. Prevent  nausea by taking the medication with a snack or meal. Avoid constipation by drinking plenty of fluids and eating foods with high amount of fiber, such as fruits, vegetables, and grains.  Do not take Tylenol if you are taking prescription pain medications.  Follow up Your surgeon may want to see you in the office following your access surgery. If so, this will be arranged at the time of your surgery.  Please call us immediately for any of the following conditions:  . Increased pain, redness, drainage (pus) from your incision site . Fever of 101 degrees or higher . Severe or worsening pain at your incision site . Hand pain or numbness. .  Reduce your risk of vascular disease:  . Stop smoking. If you would like help, call [facility] at 1-800-QUIT-NOW 321-778-5151) or [place] at (431)132-6281  . Manage your cholesterol . Maintain a desired weight . Control your diabetes . Keep your blood pressure down  Dialysis  It will take several weeks to several months for your new dialysis access to be ready for use. Your surgeon will determine when it is okay to use it. Your nephrologist will continue to direct your dialysis. You can continue to use your Permcath until your new access is ready for use.   11/29/2020 [name] [id number] 08-25-1945  Surgeon(s): Early, Arvilla Meres, MD  Procedure(s): Removal of right chest port Creation of right 1st stage basilic vein transposition   May stick graft immediately   May stick graft on designated area only:   x Do not stick fistula  for 12 weeks    If you have any questions, please call the office at [phone].

## 2020-11-29 NOTE — H&P (Signed)
Office Visit  10/23/2020 [facility][name], [place]   Physician Assistant  End stage renal disease [facility])   Dx  Follow-up ; Referred by [name], MD   Reason for Visit    Additional Documentation  Vitals:  BP 137/58Important (BP Location: Right Arm, Patient Position: Sitting, Cuff Size: Normal)   Pulse 69   Temp 97.5 F (36.4 C)Important (Temporal)   Resp 14   Ht 4\' 8"  (1.422 m)   Wt 72.1 kg   LMP (LMP Unknown)   SpO2 98%   BMI 35.65 kg/m   BSA 1.69 m   Flowsheets:  Infectious Disease Screening,   Clinical Intake,   Vital Signs,   NEWS,   MEWS Score,   Anthropometrics,   Method of Visit     Encounter Info:  Billing Info,   History,   Allergies,   Detailed Report      Orthostatic Vitals Recorded in This Encounter   10/23/2020  1536     Patient Position: Sitting  BP Location: Right Arm  Cuff Size: Normal   All Notes   Progress Notes by Dagoberto Ligas, PA-C at 10/23/2020 3:00 PM  Author: Dagoberto Ligas, PA-C Author Type: Physician Assistant Certified Filed: 9/92/4268 10:42 AM  Note Status: Signed Cosign: Cosign Not Required Encounter Date: 10/23/2020  Editor: Iline Oven (Physician Assistant Certified)                Postoperative Access Visit   History of Present Illness   [name] is a 75 y.o. year old female who presents for postoperative follow-up for: left upper arm arteriovenous graft by Dr. Stanford Breed (Date: 09/11/20).  The patient's wounds are healed.  The patient denies steal symptoms.  The patient is able to complete their activities of daily living.  She is currently dialyzing via left IJ TDC.  She is dialyzing on a Tuesday Thursday Saturday schedule at the [facility] kidney location at [address].  Surgical history also significant for left arm second stage basilic vein transposition.  Her fistula only lasted through 2 weeks of  use.  Physical Examination      Vitals:   10/23/20 1536  BP: (!) 137/58  Pulse: 69  Resp: 14  Temp: (!) 97.5 F (36.4 C)  TempSrc: Temporal  SpO2: 98%  Weight: 159 lb (72.1 kg)  Height: 4\' 8"  (1.422 m)   Body mass index is 35.65 kg/m.  left arm Incisions are healed, palpable radial pulse, hand grip is 5/5, sensation in digits is intact, no palpable thrill, no bruit    Medical Decision Making   [name] is a 75 y.o. 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  Case was discussed with Dr. Stanford Breed.  Plan will be to proceed with venogram of right upper extremity prior to new dialysis access placement in right arm.  Based on vein mapping performed in February patient will likely require right arm AV graft.  Patient will be called and notified of plan   Dagoberto Ligas PA-C [facility] [place] Office: [phone]  Clinic MD: Trula Slade       Addendum:  The patient has been re-examined and re-evaluated.  The patient's history and physical has been reviewed and is unchanged.    [name]  [name] is a 75 y.o. female is being admitted with ESRD. All the risks, benefits and other treatment options have been discussed with the patient. The patient has consented to proceed with Procedure(s): REMOVAL OF RIGHT CHEST PORT INSERTION OF ARTERIOVENOUS (AV) GORE-TEX GRAFT RIGHT UPPER EXTREMITY as a surgical intervention.  The patient underwent right arm venogram on 11/22/20 due to her [name] failure of left arm AV access.  Right brachial vein was patent.  There is no clear stenosis around the right IJ port.  She is no longer using her port since this was for iron infusion and she is now getting that with dialysis.  I have recommended removal of the port as well to reduce  risk of central venous issues with her right arm graft.  She is quite frustrated regarding her access.  I explained that this is fairly typical with maintenance required.  I did explain that she has had certainly [name] failure with her left arm graft and hopefully will not have the same outcome on the right but this is certainly possible.  We will proceed with right Port-A-Cath removal and right arm AV graft  [name] 11/29/2020 9:44 AM [facility]

## 2020-11-29 NOTE — Anesthesia Postprocedure Evaluation (Signed)
Anesthesia Post Note  Patient: [name]  Procedure(s) Performed: REMOVAL OF RIGHT CHEST PORT (Right ) Creation of Arteriovenous Fistula Right Upper Arm (Right )     Patient location during evaluation: PACU Anesthesia Type: General Level of consciousness: awake and alert Pain management: pain level controlled Vital Signs Assessment: post-procedure vital signs reviewed and stable Respiratory status: spontaneous breathing, nonlabored ventilation, respiratory function stable and patient connected to nasal cannula oxygen Cardiovascular status: stable and blood pressure returned to baseline Postop Assessment: no apparent nausea or vomiting Anesthetic complications: no   No complications documented.  Last Vitals:  Vitals:   11/29/20 1235 11/29/20 1250  BP: (!) 101/45 (!) 116/94  Pulse: (!) 59   Resp: 16 14  Temp:  (!) 36.1 C  SpO2: 99% 94%    Last Pain:  Vitals:   11/29/20 1250  TempSrc:   PainSc: 0-No pain                 Tyre Beaver COKER

## 2020-11-29 NOTE — Op Note (Signed)
    OPERATIVE REPORT  DATE OF SURGERY: 11/29/2020  PATIENT: [name], 75 y.o. female MRN: [id number]  DOB: Apr 25, 1946  PRE-OPERATIVE DIAGNOSIS: End-stage renal disease  POST-OPERATIVE DIAGNOSIS:  Same  PROCEDURE: 1 right for stage brachiobasilic fistula, #2 removal of right subclavian Port-A-Cath  SURGEON:  Curt Jews, M.D.  PHYSICIAN ASSISTANT: Rhyne  The assistant was needed for exposure and to expedite the case  ANESTHESIA: Local with sedation  EBL: per anesthesia record  Total I/O In: 200 [I.V.:200] Out: 3 [Blood:3]  BLOOD ADMINISTERED: none  DRAINS: none  SPECIMEN: none  COUNTS CORRECT:  YES  PATIENT DISPOSITION:  PACU - hemodynamically stable  PROCEDURE DETAILS: Patient was taken operating placed supine extremity area of the right arm right chest prepped draped in sterile fashion.  Please onset of energy visualize the veins of the right arm.  The patient had a very nice caliber basilic vein.  There was ranging just below the antecubital space.  Patient had an easily palpable brachial artery but did not have a radial pulse.  Incision was made using local anesthesia at the antecubital space and carried down to isolate the basilic vein.  2 branches were identified and the largest was on the more medial aspect.  Tributary branches were ligated and divided.  The vein was ligated distally and divided.  The vein was brought into approximation with the brachial artery which was exposed through the same incision.  The patient had extensive calcification of the brachial artery.  The artery was occluded proximally distally and was opened with 11 blade Celestia Potts scissors.  The vein was cut to the appropriate length and spatulated and sewn end-to-side to the artery with a running 6-0 Prolene suture.  Clamps were removed and excellent thrill was noted.  Wounds irrigated with saline.  Hemostasis tended to cautery.  The wounds were closed with 3-0 Vicryl in the subcutaneous  and subcuticular tissue.  Attention was then turned to the right chest.  Incision was made using local anesthesia around the prior incision from the Port-A-Cath.  The catheter itself was displaced removed with gentle traction and was not completely intact.  The port itself was localized and removed.  The wound was irrigated with saline.  Hemostasis electrocautery.  The wound was closed with 3-0 Vicryl in the subcutaneous tissue Close the pocket.  The skin was closed with 3-0 subcuticular Vicryl suture.  Sterile dressing was applied the patient was transferred to the recovery room in stable   Rosetta Posner, M.D., [facility] 11/29/2020 11:54 AM  Note: Portions of this report may have been transcribed using voice recognition software.  Every effort has been made to ensure accuracy; however, inadvertent computerized transcription errors may still be present.

## 2020-11-29 NOTE — Transfer of Care (Signed)
Immediate Anesthesia Transfer of Care Note  Patient: [name]  Procedure(s) Performed: REMOVAL OF RIGHT CHEST PORT (Right ) Creation of Arteriovenous Fistula Right Upper Arm (Right )  Patient Location: PACU  Anesthesia Type:MAC  Level of Consciousness: awake, alert , oriented and patient cooperative  Airway & Oxygen Therapy: Patient Spontanous Breathing  Post-op Assessment: Report given to RN, Post -op Vital signs reviewed and stable and Patient moving all extremities X 4  Post vital signs: Reviewed and stable  Last Vitals:  Vitals Value Taken Time  BP 71/46 11/29/20 1149  Temp    Pulse 67 11/29/20 1150  Resp 14 11/29/20 1150  SpO2 99 % 11/29/20 1150  Vitals shown include unvalidated device data.  Last Pain:  Vitals:   11/29/20 0937  TempSrc:   PainSc: 0-No pain      Patients Stated Pain Goal: 3 (83/33/83 2919)  Complications: No complications documented.

## 2020-11-30 ENCOUNTER — Encounter (HOSPITAL_COMMUNITY): Payer: Self-pay | Admitting: Vascular Surgery

## 2020-11-30 LAB — POCT I-STAT, CHEM 8
BUN: 32 mg/dL — ABNORMAL HIGH (ref 8–23)
Calcium, Ion: 0.95 mmol/L — ABNORMAL LOW (ref 1.15–1.40)
Chloride: 103 mmol/L (ref 98–111)
Creatinine, Ser: 5.5 mg/dL — ABNORMAL HIGH (ref 0.44–1.00)
Glucose, Bld: 111 mg/dL — ABNORMAL HIGH (ref 70–99)
HCT: 37 % (ref 36.0–46.0)
Hemoglobin: 12.6 g/dL (ref 12.0–15.0)
Potassium: 7.5 mmol/L (ref 3.5–5.1)
Sodium: 132 mmol/L — ABNORMAL LOW (ref 135–145)
TCO2: 28 mmol/L (ref 22–32)

## 2020-12-12 ENCOUNTER — Other Ambulatory Visit: Payer: Self-pay

## 2020-12-12 DIAGNOSIS — N186 End stage renal disease: Secondary | ICD-10-CM

## 2020-12-18 NOTE — Telephone Encounter (Signed)
Result letter sent to the pt °

## 2020-12-18 NOTE — Telephone Encounter (Signed)
OV made and appt card mailed °

## 2020-12-18 NOTE — Telephone Encounter (Signed)
Reviewed labs dated 11/17/20:   WBC 8300, H/H 11.2/36.1, MCV 92, iron 68, tibc 248, ferritin 605, iron sat 27. On 11/29/20 her H/H were normal at 12.6/37.  Seen back in 09/2020 for abd pain and concern for decline in Hgb/iron.  Will continue to monitor her H/H.   Please let pt know H/H stable and normal on last check. We will continue to follow for now.   Needs return ov in 3 months.

## 2020-12-18 NOTE — Progress Notes (Signed)
Dear Mikle Bosworth,   Your result of your labs showed that your hemoglobin and hematocrit  are stable and normal. We will continue to monitor for now.   If you have any questions/concerns please call us @ [phone].   Thank you, Oleh Genin, CMA

## 2021-01-10 ENCOUNTER — Ambulatory Visit (HOSPITAL_COMMUNITY)
Admission: RE | Admit: 2021-01-10 | Discharge: 2021-01-10 | Disposition: A | Discharge status: Home / Self Care | Payer: Medicare Other | Source: Ambulatory Visit | Attending: Vascular Surgery | Admitting: Vascular Surgery

## 2021-01-10 ENCOUNTER — Encounter: Payer: Self-pay | Admitting: Physician Assistant

## 2021-01-10 ENCOUNTER — Other Ambulatory Visit: Payer: Self-pay

## 2021-01-10 ENCOUNTER — Ambulatory Visit (INDEPENDENT_AMBULATORY_CARE_PROVIDER_SITE_OTHER): Payer: Medicare Other | Admitting: Physician Assistant

## 2021-01-10 VITALS — BP 100/70 | HR 58 | Temp 97.2°F | Resp 20 | Ht 59.0 in

## 2021-01-10 DIAGNOSIS — N186 End stage renal disease: Secondary | ICD-10-CM | POA: Diagnosis present

## 2021-01-10 DIAGNOSIS — Z992 Dependence on renal dialysis: Secondary | ICD-10-CM

## 2021-01-10 NOTE — Progress Notes (Signed)
POST OPERATIVE DIALYSIS ACCESS OFFICE NOTE    CC:  F/u for dialysis access surgery  HPI:  This is a 75 y.o. female with ESRD in need of permanent HD access. She had failed two left upper extremity access creations. She has a left IJ [facility] which is likely causing central stenosis. She has a right IJ port for iron infusions as well. Venogram performed by Dr. Stanford Breed 11/22/2020 with no clear central stenosis from right IJ port. This was subsequently removed and she underwent concomitant creation of right 1st stage brachiobasilic AVF.  She denies hand pain or numbness.  She is dialyzing via left IJ TDC without complications.  No fever or chills.  Dialysis days:  TTS  Dialysis center:  [facility]  No Known Allergies  Current Outpatient Medications  Medication Sig Dispense Refill  . acetaminophen (TYLENOL) 325 MG tablet Take 650 mg by mouth daily as needed for headache (pain).    Marland Kitchen albuterol (PROVENTIL) (2.5 MG/3ML) 0.083% nebulizer solution Take 2.5 mg by nebulization 4 (four) times daily as needed for wheezing or shortness of breath.     Marland Kitchen albuterol (VENTOLIN HFA) 108 (90 Base) MCG/ACT inhaler Inhale 2 puffs into the lungs every 6 (six) hours as needed for wheezing or shortness of breath.     . allopurinol (ZYLOPRIM) 100 MG tablet Take 1 tablet (100 mg total) by mouth 2 (two) times daily. (Patient taking differently: Take 100 mg by mouth at bedtime.)    . amiodarone (PACERONE) 200 MG tablet Take 1 tablet (200 mg total) by mouth daily.    Marland Kitchen apixaban (ELIQUIS) 2.5 MG TABS tablet Take 2.5 mg by mouth 2 (two) times daily.    Marland Kitchen aspirin EC 81 MG tablet Take 81 mg by mouth daily.    Marland Kitchen atorvastatin (LIPITOR) 40 MG tablet Take 1 tablet (40 mg total) by mouth at bedtime. 90 tablet 1  . Biotin 5000 MCG TABS Take 5,000 mcg by mouth daily.    . Carboxymethylcellul-Glycerin (CLEAR EYES FOR DRY EYES) 1-0.25 % SOLN Place 1 drop into both eyes daily as needed (dry eys).    . cholecalciferol (VITAMIN D) 25 MCG (1000  UNIT) tablet Take 1,000 Units by mouth daily.    Marland Kitchen doxercalciferol (HECTOROL) 0.5 MCG capsule Doxercalciferol (Hectorol)    . esomeprazole (NEXIUM) 40 MG capsule Take 40 mg by mouth daily.    . famotidine (PEPCID) 40 MG tablet TAKE 1 TABLET AT BEDTIME AS NEEDED TO CONTROL REFLUX (Patient taking differently: Take 40 mg by mouth at bedtime.) 90 tablet 1  . ferric citrate (AURYXIA) 1 GM 210 MG(Fe) tablet Take 210 mg by mouth 2 (two) times daily with a meal.    . Fluticasone-Umeclidin-Vilant (TRELEGY ELLIPTA) 100-62.5-25 MCG/INH AEPB Inhale 1 puff into the lungs daily.    Marland Kitchen gabapentin (NEURONTIN) 100 MG capsule Take 100 mg by mouth at bedtime.    . insulin lispro (HUMALOG) 100 UNIT/ML injection Inject 5-16 Units into the skin 3 (three) times daily as needed (greater than 120). 11/28/20- "has not take in a while".    Marland Kitchen LANTUS SOLOSTAR 100 UNIT/ML SOPN Inject 10 Units into the skin daily as needed (over 120 blood sugars). 11/28/20 "has no used in a long time."    . lidocaine (LIDODERM) 5 % Place 1 patch onto the skin daily. Remove & Discard patch within 12 hours or as directed by MD (Patient taking differently: Place 1 patch onto the skin daily as needed (pain.). Remove & Discard patch within 12 hours  or as directed by MD) 30 patch 0  . Methoxy PEG-Epoetin Beta (MIRCERA IJ) Mircera    . midodrine (PROAMATINE) 10 MG tablet Take 1 tablet (10 mg total) by mouth 3 (three) times daily with meals.    . nitroGLYCERIN (NITROSTAT) 0.4 MG SL tablet Place 0.4 mg under the tongue every 5 (five) minutes x 3 doses as needed for chest pain.    Marland Kitchen omeprazole (PRILOSEC) 40 MG capsule Take 40 mg by mouth daily.    . ondansetron (ZOFRAN) 4 MG tablet Take 4 mg by mouth every 8 (eight) hours as needed for nausea/vomiting, nausea or vomiting.    Marland Kitchen oxyCODONE-acetaminophen (PERCOCET) 5-325 MG tablet Take 1 tablet by mouth every 6 (six) hours as needed for severe pain. 10 tablet 0  . OXYGEN Inhale 2 L into the lungs See admin  instructions. Use every night and as needed during the day    . PRESCRIPTION MEDICATION Inhale into the lungs at bedtime. Bipap    . tamsulosin (FLOMAX) 0.4 MG CAPS capsule Take 1 capsule (0.4 mg total) by mouth daily. 30 capsule    Current Facility-Administered Medications  Medication Dose Route Frequency Provider Last Rate Last Admin  . 0.9 %  sodium chloride infusion  250 mL Intravenous PRN Cherre Robins, MD      . sodium chloride flush (NS) 0.9 % injection 3 mL  3 mL Intravenous Q12H Cherre Robins, MD      . sodium chloride flush (NS) 0.9 % injection 3 mL  3 mL Intravenous PRN Cherre Robins, MD         ROS:  See HPI  LMP  (LMP Unknown)  Vitals:   01/10/21 1159  Height: 4\' 11"  (1.499 m)    Physical Exam: General appearance: awake, alert in NAD Chest: catheter site without bleeding or erythema. Dressing dry and intact. Respirations: unlabored; no dyspnea at rest Right upper extremity: Hand is warm with 5/5 grip strength. Motor function and sensation intact. Good bruit and thrill in fistula. Radial pulse palpable. Incision(s): Well approximated.   Dialysis duplex on 11/08/9975 Basilic vein diameter ranges from 0.60 to 0.80 cm today.  Assessment/Plan:   -pt does not have evidence of steal syndrome -dialysis duplex today reveals fistula to be of adequate diameter for second stage.  We will make arrangements for this on her nondialysis day.   Barbie Banner, PA-C 01/10/2021 12:01 PM [facility] [phone]  Clinic MD:  Dr. Scot Dock

## 2021-01-10 NOTE — H&P (View-Only) (Signed)
POST OPERATIVE DIALYSIS ACCESS OFFICE NOTE    CC:  F/u for dialysis access surgery  HPI:  This is a 75 y.o. female with ESRD in need of permanent HD access. She had failed two left upper extremity access creations. She has a left IJ [facility] which is likely causing central stenosis. She has a right IJ port for iron infusions as well. Venogram performed by Dr. Stanford Breed 11/22/2020 with no clear central stenosis from right IJ port. This was subsequently removed and she underwent concomitant creation of right 1st stage brachiobasilic AVF.  She denies hand pain or numbness.  She is dialyzing via left IJ TDC without complications.  No fever or chills.  Dialysis days:  TTS  Dialysis center:  [facility]  No Known Allergies  Current Outpatient Medications  Medication Sig Dispense Refill  . acetaminophen (TYLENOL) 325 MG tablet Take 650 mg by mouth daily as needed for headache (pain).    Marland Kitchen albuterol (PROVENTIL) (2.5 MG/3ML) 0.083% nebulizer solution Take 2.5 mg by nebulization 4 (four) times daily as needed for wheezing or shortness of breath.     Marland Kitchen albuterol (VENTOLIN HFA) 108 (90 Base) MCG/ACT inhaler Inhale 2 puffs into the lungs every 6 (six) hours as needed for wheezing or shortness of breath.     . allopurinol (ZYLOPRIM) 100 MG tablet Take 1 tablet (100 mg total) by mouth 2 (two) times daily. (Patient taking differently: Take 100 mg by mouth at bedtime.)    . amiodarone (PACERONE) 200 MG tablet Take 1 tablet (200 mg total) by mouth daily.    Marland Kitchen apixaban (ELIQUIS) 2.5 MG TABS tablet Take 2.5 mg by mouth 2 (two) times daily.    Marland Kitchen aspirin EC 81 MG tablet Take 81 mg by mouth daily.    Marland Kitchen atorvastatin (LIPITOR) 40 MG tablet Take 1 tablet (40 mg total) by mouth at bedtime. 90 tablet 1  . Biotin 5000 MCG TABS Take 5,000 mcg by mouth daily.    . Carboxymethylcellul-Glycerin (CLEAR EYES FOR DRY EYES) 1-0.25 % SOLN Place 1 drop into both eyes daily as needed (dry eys).    . cholecalciferol (VITAMIN D) 25 MCG (1000  UNIT) tablet Take 1,000 Units by mouth daily.    Marland Kitchen doxercalciferol (HECTOROL) 0.5 MCG capsule Doxercalciferol (Hectorol)    . esomeprazole (NEXIUM) 40 MG capsule Take 40 mg by mouth daily.    . famotidine (PEPCID) 40 MG tablet TAKE 1 TABLET AT BEDTIME AS NEEDED TO CONTROL REFLUX (Patient taking differently: Take 40 mg by mouth at bedtime.) 90 tablet 1  . ferric citrate (AURYXIA) 1 GM 210 MG(Fe) tablet Take 210 mg by mouth 2 (two) times daily with a meal.    . Fluticasone-Umeclidin-Vilant (TRELEGY ELLIPTA) 100-62.5-25 MCG/INH AEPB Inhale 1 puff into the lungs daily.    Marland Kitchen gabapentin (NEURONTIN) 100 MG capsule Take 100 mg by mouth at bedtime.    . insulin lispro (HUMALOG) 100 UNIT/ML injection Inject 5-16 Units into the skin 3 (three) times daily as needed (greater than 120). 11/28/20- "has not take in a while".    Marland Kitchen LANTUS SOLOSTAR 100 UNIT/ML SOPN Inject 10 Units into the skin daily as needed (over 120 blood sugars). 11/28/20 "has no used in a long time."    . lidocaine (LIDODERM) 5 % Place 1 patch onto the skin daily. Remove & Discard patch within 12 hours or as directed by MD (Patient taking differently: Place 1 patch onto the skin daily as needed (pain.). Remove & Discard patch within 12 hours  or as directed by MD) 30 patch 0  . Methoxy PEG-Epoetin Beta (MIRCERA IJ) Mircera    . midodrine (PROAMATINE) 10 MG tablet Take 1 tablet (10 mg total) by mouth 3 (three) times daily with meals.    . nitroGLYCERIN (NITROSTAT) 0.4 MG SL tablet Place 0.4 mg under the tongue every 5 (five) minutes x 3 doses as needed for chest pain.    Marland Kitchen omeprazole (PRILOSEC) 40 MG capsule Take 40 mg by mouth daily.    . ondansetron (ZOFRAN) 4 MG tablet Take 4 mg by mouth every 8 (eight) hours as needed for nausea/vomiting, nausea or vomiting.    Marland Kitchen oxyCODONE-acetaminophen (PERCOCET) 5-325 MG tablet Take 1 tablet by mouth every 6 (six) hours as needed for severe pain. 10 tablet 0  . OXYGEN Inhale 2 L into the lungs See admin  instructions. Use every night and as needed during the day    . PRESCRIPTION MEDICATION Inhale into the lungs at bedtime. Bipap    . tamsulosin (FLOMAX) 0.4 MG CAPS capsule Take 1 capsule (0.4 mg total) by mouth daily. 30 capsule    Current Facility-Administered Medications  Medication Dose Route Frequency Provider Last Rate Last Admin  . 0.9 %  sodium chloride infusion  250 mL Intravenous PRN Cherre Robins, MD      . sodium chloride flush (NS) 0.9 % injection 3 mL  3 mL Intravenous Q12H Cherre Robins, MD      . sodium chloride flush (NS) 0.9 % injection 3 mL  3 mL Intravenous PRN Cherre Robins, MD         ROS:  See HPI  LMP  (LMP Unknown)  Vitals:   01/10/21 1159  Height: 4\' 11"  (1.499 m)    Physical Exam: General appearance: awake, alert in NAD Chest: catheter site without bleeding or erythema. Dressing dry and intact. Respirations: unlabored; no dyspnea at rest Right upper extremity: Hand is warm with 5/5 grip strength. Motor function and sensation intact. Good bruit and thrill in fistula. Radial pulse palpable. Incision(s): Well approximated.   Dialysis duplex on 03/12/9168 Basilic vein diameter ranges from 0.60 to 0.80 cm today.  Assessment/Plan:   -pt does not have evidence of steal syndrome -dialysis duplex today reveals fistula to be of adequate diameter for second stage.  We will make arrangements for this on her nondialysis day.   Barbie Banner, PA-C 01/10/2021 12:01 PM [facility] [phone]  Clinic MD:  Dr. Scot Dock

## 2021-01-12 NOTE — Anesthesia Preprocedure Evaluation (Addendum)
Anesthesia Evaluation  Patient identified by MRN, date of birth, ID band Patient awake    Reviewed: Allergy & Precautions, NPO status , Patient's Chart, lab work & pertinent test results  History of Anesthesia Complications Negative for: history of anesthetic complications  Airway Mallampati: II  TM Distance: >3 FB Neck ROM: Full    Dental  (+) Poor Dentition, Missing, Dental Advisory Given   Pulmonary sleep apnea, Continuous Positive Airway Pressure Ventilation and Oxygen sleep apnea , COPD,  COPD inhaler, former smoker,    breath sounds clear to auscultation       Cardiovascular hypertension, Pt. on medications (-) angina+ CAD (non-obstructive by cath)  + dysrhythmias Atrial Fibrillation  Rhythm:Irregular Rate:Normal  12/2019 ECHO: EF 55%, mild MR, mod TR   Neuro/Psych Depression Peripheral neuropathy    GI/Hepatic Neg liver ROS, hiatal hernia, GERD  Medicated and Controlled,  Endo/Other  diabetes (glu 107), Insulin DependentMorbid obesity  Renal/GU Dialysis and ESRFRenal disease (K+ 4.9)   H/o ovarian cancer    Musculoskeletal   Abdominal (+) + obese,   Peds  Hematology eliquis   Anesthesia Other Findings   Reproductive/Obstetrics                           Anesthesia Physical Anesthesia Plan  ASA: 3  Anesthesia Plan: Regional and MAC   Post-op Pain Management:    Induction:   PONV Risk Score and Plan: Ondansetron and Treatment may vary due to age or medical condition  Airway Management Planned: Natural Airway and Simple Face Mask  Additional Equipment: None  Intra-op Plan:   Post-operative Plan:   Informed Consent: I have reviewed the patients History and Physical, chart, labs and discussed the procedure including the risks, benefits and alternatives for the proposed anesthesia with the patient or authorized representative who has indicated his/her understanding and  acceptance.     Dental advisory given  Plan Discussed with: CRNA and Surgeon  Anesthesia Plan Comments: (PAT note written 01/12/2021 by Myra Gianotti, PA-C. Plan routine monitors, Supraclavicular block with sedation)     Anesthesia Quick Evaluation

## 2021-01-12 NOTE — Progress Notes (Signed)
Anesthesia Chart Review: [name]  Case: [id number] Date/Time: 01/15/21 0715   Procedure: RIGHT UPPER ARM SECOND STAGE BASCILIC VEIN TRANSPOSITION (Right) - PERIPHERAL NERVE BLOCK   Anesthesia type: Monitor Anesthesia Care   Pre-op diagnosis: ESRD   Location: [facility] OR ROOM 12 / [place] OR   Surgeons: Cherre Robins, MD       DISCUSSION: Patient is a 75 year old female scheduled for the above procedure. She has ESRD. Per 01/10/21 vascular surgery note, "She had failed two left upper extremity access creations. She has a left IJ [facility] which is likely causing central stenosis. She has a right IJ port for iron infusions as well. Venogram performed by Dr. Stanford Breed 11/22/2020 with no clear central stenosis from right IJ port. This was subsequently removed and she underwent concomitant creation of right 1st stage brachiobasilic AVF [pm 2/83/15]."  History includes former smoker (quit 08/05/61), COPD (on Trelegy, as needed albuterol), CAD (by notes, non-obstructive 2000), pulmonary hypertension, HTN & hypotension (on midodrine), HLD, DM2 (with neuropathy), PAF (on Eliquis), CHF, ESRD, GERD, hiatal hernia, anemia, IBS, chronic back pain, OSA (CPAP), dyspnea, cervical cancer (s/p partial hysterectomy 1978), back surgery (laminectomy ~ 2004; L3-5 laminectomy, fusion 01/27/07).   Admitted 07/19/20-07/27/20 for acute on chronic hypoxic respiratory failure due to acute COVID-19 viral pneumonia.  She was noted to have been vaccinated with 2 doses of Moderna.  She also had fluid overloaded and hyperkalemic due to missed dialysis x2.  She was treated with IV remdesivir and Solu-Medrol.  Fluid overload and electrolyte abnormalities treated with dialysis.  She was discharged on 2 L O2 via nasal cannula intermittently, which was noted to be her baseline.    She will need day of surgery labs and evaluation.    VS:  BP Readings from Last 3 Encounters:  01/10/21 100/70  11/29/20 (!) 116/94  11/22/20 (!) 102/38   Pulse  Readings from Last 3 Encounters:  01/10/21 (!) 58  11/29/20 (!) 59  11/22/20 67     PROVIDERS: [name], MD Charolette Forward, MD is cardiologist ([facility]). 08/28/20 office note and 12/31/19 echo scanned under Media tab, Correspondence 09/11/20). Stable at visit, no changes made.   LABS: For day of surgery. As of 11/19/09, H/H 12.6/37.0. A1c 6.0% 04/24/20.     EKG:  - 07/20/20 EKGs in setting of hyperkalemia requiring Lokelma, hypoglycemia, COVID PNA:  EKG 07/20/20 15:03:20: Undetermined rhythm  Left axis deviation Right bundle branch block Possible Lateral infarct , age undetermined Inferior infarct , age undetermined Abnormal ECG Confirmed by Ena Dawley [phone]) on 07/21/2020 11:43:41 PM  EKG 07/20/20 01:18:12: Sinus rhythm Incomplete left bundle branch block Low voltage, extremity leads Anterior Q waves, possibly due to ILBBB Baseline wander in lead(s) V3 V4 V6 Confirmed by Ripley Fraise [phone]) on 07/20/2020 1:21:25 AM  - EKG 04/24/20: Sinus rhythm Incomplete left bundle branch block Borderline low voltage, extremity leads Minimal ST elevation, anterior leads Confirmed by Elnora Morrison [phone]) on 04/24/2020 1:45:35 PM   CV: TTE 12/31/2019 (Dr. Terrence Dupont; copy scanned under Media tab, Correspondence 09/11/20): Summary: Normal left ventricular size.  Left ventricular systolic function was normal with normal ejection fraction of 55%. The left ventricle shows normal contractility and size. Left ventricular hypertrophy is seen with normal diastolic function noted. Mild mitral regurgitation is seen. Trace pulmonic regurgitation is present. Moderate tricuspid regurgitation is seen.  RVSP estimated at 68 mmHg with moderate pulmonary hypertension. No intracardiac shunting is found. No intracardiac masses or thrombi are  seen. No pericardial effusion is seen with no suggestion of pericardial tamponade.   Nuclear stress test 12/10/12: Overall  Impression:  Normal stress nuclear study. LV Ejection Fraction: 70%.  LV Wall Motion:  NL LV Function; NL Wall Motion  Reported had non-obstructive CAD on 2000 cath (I don't see cath report).   Past Medical History:  Diagnosis Date   Blood transfusion without reported diagnosis    CAD (coronary artery disease)    Nonobstructive at cardiac catheterization 2000   Cataract    Cervical cancer ([place]) 1978   CHF (congestive heart failure) (HCC)    Chronic back pain    Chronic kidney disease    Class 2 obesity with body mass index (BMI) of 35 to 39.9 without comorbidity    Complication of anesthesia    hard to awaken with one back surgery - years ago, no problem since   COPD (chronic obstructive pulmonary disease) ([place])    Degenerative disc disease    DM (diabetes mellitus), type 2 with renal complications ([place])    Dyslipidemia    Dyspnea    "due to COPD"   Dysrhythmia    a-fib   ESRD (end stage renal disease) on dialysis ([place])    TTHS- [address]   GERD (gastroesophageal reflux disease)    Gout    Hiatal hernia 07/27/2013   History of diverticulitis of colon    History of hiatal hernia    HTN (hypertension)    09/09/20 not currently on medication, is on medication for hypotension   Hypotension    Iron deficiency anemia    Irritable bowel syndrome    Lumbar radiculopathy    Mixed hyperlipidemia    Moderate major depression, single episode ([place]) 07/21/2019   Neuropathy    OSA (obstructive sleep apnea)    Osteoporosis    Ovarian cancer ([place]) 1978   patient denies. States this was cervical cancer   Oxygen deficiency    room air   PAF (paroxysmal atrial fibrillation) (HCC)    Pneumonia    Sleep apnea    bipap - oxygen 2 l to bipap.   Type 2 diabetes mellitus ([place])    Vitamin B deficiency 12/25/2009   Vitamin B12 deficiency     Past Surgical History:  Procedure Laterality Date   ABDOMINAL HYSTERECTOMY     AV FISTULA PLACEMENT Left 08/05/2019   Procedure:  ARTERIOVENOUS (AV) FISTULA CREATION LEFT ARM;  Surgeon: Waynetta Sandy, MD;  Location: [place];  Service: Vascular;  Laterality: Left;   AV FISTULA PLACEMENT Left 09/11/2020   Procedure: INSERTION OF ARTERIOVENOUS (AV) GORE-TEX GRAFT ARM LEFT;  Surgeon: Cherre Robins, MD;  Location: [place];  Service: Vascular;  Laterality: Left;   AV FISTULA PLACEMENT Right 11/29/2020   Procedure: Creation of Arteriovenous Fistula Right Upper Arm;  Surgeon: Rosetta Posner, MD;  Location: [place];  Service: Vascular;  Laterality: Right;   [place] Left 10/05/2019   Procedure: SECOND STAGE LEFT [place];  Surgeon: Waynetta Sandy, MD;  Location: [place];  Service: Vascular;  Laterality: Left;   Benign breast cysts     CHOLECYSTECTOMY     COLONOSCOPY  10/01/2006   SLF:Pan colonic diverticulosis and moderate internal hemorrhoids/ Otherwise no polyps, masses, inflammatory changes or AVMs/   COLONOSCOPY  2011   SLF: pancolonic diverticulosis, large internal hemorrhoids   COLONOSCOPY N/A 01/26/2016   Procedure: COLONOSCOPY;  Surgeon: [name], MD;  [facility];  Service: Endoscopy;  Laterality: N/A;  830    COLONOSCOPY WITH PROPOFOL N/A 02/10/2020   Procedure: COLONOSCOPY WITH PROPOFOL;  Surgeon: Daneil Dolin, MD;  [facility];  Service: Endoscopy;  Laterality: N/A;  10:45am   ESOPHAGOGASTRODUODENOSCOPY  11/19/2006   SLF: Large hiatal hernia without evidence of Cameron ulcers/. Distal esophageal stricture, which allowed the gastroscope to pass without resistance.  A 16 mm Savary later passed with mild resistance/ Normal stomach.sb bx negative   ESOPHAGOGASTRODUODENOSCOPY  10/01/2006   KLK:[id number] hiatal hernia.  Distal esophagus without evidence of   erythema, ulceration or Barrett's esophagus   ESOPHAGOGASTRODUODENOSCOPY  2011   SLF: large hh, distal esophageal web narrowing to 3mm s/p dilation to 64mm    ESOPHAGOGASTRODUODENOSCOPY N/A 08/06/2013   SLF: 1. Stricture at the gastroesophageal junction 2. large hiatal hernia. 3. Mild erosive gastritis.   ESOPHAGOGASTRODUODENOSCOPY (EGD) WITH PROPOFOL N/A 07/19/2019   rourk: Mild erosive reflux esophagitis.  Mild Schatzki ring status post dilation.  Large hiatal hernia with at least one half of the stomach above the diaphragm.  Gastric mucosa erythematous.   GIVENS CAPSULE STUDY N/A 08/06/2013   INCOMPLETE-SMALL BOWLE ULCERS   IR FLUORO GUIDE CV LINE LEFT  07/29/2019   IR US GUIDE VASC ACCESS LEFT  07/29/2019   KNEE SURGERY Right    PARTIAL HYSTERECTOMY  1978   PORT-A-CATH REMOVAL Right 11/29/2020   Procedure: REMOVAL OF RIGHT CHEST PORT;  Surgeon: Rosetta Posner, MD;  Location: [place];  Service: Vascular;  Laterality: Right;   small bowel capsule  2008   negative   SPINE SURGERY     TONSILLECTOMY AND ADENOIDECTOMY     Two back surgeries/fusion     UMBILICAL HERNIA REPAIR  2010   UPPER EXTREMITY VENOGRAPHY N/A 11/22/2020   Procedure: UPPER EXTREMITY VENOGRAPHY;  Surgeon: Cherre Robins, MD;  Location: [place] CV LAB;  Service: Cardiovascular;  Laterality: N/A;    MEDICATIONS:  0.9 %  sodium chloride infusion   sodium chloride flush (NS) 0.9 % injection 3 mL   sodium chloride flush (NS) 0.9 % injection 3 mL    acetaminophen (TYLENOL) 325 MG tablet   albuterol (PROVENTIL) (2.5 MG/3ML) 0.083% nebulizer solution   allopurinol (ZYLOPRIM) 100 MG tablet   amiodarone (PACERONE) 200 MG tablet   apixaban (ELIQUIS) 2.5 MG TABS tablet   aspirin EC 81 MG tablet   atorvastatin (LIPITOR) 40 MG tablet   Biotin 5000 MCG TABS   Camphor-Menthol-Methyl Sal (SALONPAS) 3.08-10-08 % PTCH   Carboxymethylcellul-Glycerin (CLEAR EYES FOR DRY EYES) 1-0.25 % SOLN   Cholecalciferol (VITAMIN D3) 50 MCG (2000 UT) TABS   esomeprazole (NEXIUM) 40 MG capsule   ferric citrate (AURYXIA) 1 GM 210 MG(Fe) tablet   Fluticasone-Umeclidin-Vilant (TRELEGY ELLIPTA) 100-62.5-25  MCG/INH AEPB   gabapentin (NEURONTIN) 100 MG capsule   insulin glargine (LANTUS) 100 unit/mL SOPN   insulin lispro (HUMALOG) 100 UNIT/ML injection   lidocaine (LIDODERM) 5 %   midodrine (PROAMATINE) 10 MG tablet   naproxen sodium (ALEVE) 220 MG tablet   nitroGLYCERIN (NITROSTAT) 0.4 MG SL tablet   ondansetron (ZOFRAN) 4 MG tablet   OXYGEN   Phenylephrine-DM-GG-APAP (MUCINEX SINUS-MAX) 5-10-200-325 MG TABS   doxercalciferol (HECTOROL) 0.5 MCG capsule   Methoxy PEG-Epoetin Beta (MIRCERA IJ)   oxyCODONE-acetaminophen (PERCOCET) 5-325 MG tablet   PRESCRIPTION MEDICATION    Myra Gianotti, PA-C Surgical Short Stay/Anesthesiology [facility] Phone [phone] [facility] Phone [phone] 01/12/2021 11:27 AM

## 2021-01-12 NOTE — Progress Notes (Signed)
Called patient for pre-op instructions only. Patient stated MD instructed her to stop Eliquis on 01/11/21 and to continue taking aspirin as prescribed. Patient also stated she does not take Lantus on a daily basis.  Verbalized understanding of all pre-op instructions.

## 2021-01-15 ENCOUNTER — Ambulatory Visit (HOSPITAL_COMMUNITY): Payer: Medicare Other | Admitting: Vascular Surgery

## 2021-01-15 ENCOUNTER — Other Ambulatory Visit: Payer: Self-pay

## 2021-01-15 ENCOUNTER — Ambulatory Visit (HOSPITAL_COMMUNITY)
Admission: RE | Admit: 2021-01-15 | Discharge: 2021-01-15 | Disposition: A | Discharge status: Home / Self Care | Payer: Medicare Other | Attending: Vascular Surgery | Admitting: Vascular Surgery

## 2021-01-15 ENCOUNTER — Encounter (HOSPITAL_COMMUNITY): Payer: Self-pay | Admitting: Vascular Surgery

## 2021-01-15 ENCOUNTER — Encounter (HOSPITAL_COMMUNITY)
Admission: RE | Disposition: A | Discharge status: Home / Self Care | Payer: Self-pay | Source: Home / Self Care | Attending: Vascular Surgery

## 2021-01-15 DIAGNOSIS — Z7982 Long term (current) use of aspirin: Secondary | ICD-10-CM | POA: Diagnosis not present

## 2021-01-15 DIAGNOSIS — Z7901 Long term (current) use of anticoagulants: Secondary | ICD-10-CM | POA: Diagnosis not present

## 2021-01-15 DIAGNOSIS — Z79899 Other long term (current) drug therapy: Secondary | ICD-10-CM | POA: Diagnosis not present

## 2021-01-15 DIAGNOSIS — Z87891 Personal history of nicotine dependence: Secondary | ICD-10-CM | POA: Diagnosis not present

## 2021-01-15 DIAGNOSIS — Z992 Dependence on renal dialysis: Secondary | ICD-10-CM

## 2021-01-15 DIAGNOSIS — Z7951 Long term (current) use of inhaled steroids: Secondary | ICD-10-CM | POA: Diagnosis not present

## 2021-01-15 DIAGNOSIS — E1122 Type 2 diabetes mellitus with diabetic chronic kidney disease: Secondary | ICD-10-CM | POA: Diagnosis not present

## 2021-01-15 DIAGNOSIS — Z794 Long term (current) use of insulin: Secondary | ICD-10-CM | POA: Insufficient documentation

## 2021-01-15 DIAGNOSIS — I480 Paroxysmal atrial fibrillation: Secondary | ICD-10-CM | POA: Insufficient documentation

## 2021-01-15 DIAGNOSIS — N186 End stage renal disease: Secondary | ICD-10-CM | POA: Insufficient documentation

## 2021-01-15 DIAGNOSIS — I509 Heart failure, unspecified: Secondary | ICD-10-CM | POA: Insufficient documentation

## 2021-01-15 DIAGNOSIS — J449 Chronic obstructive pulmonary disease, unspecified: Secondary | ICD-10-CM | POA: Diagnosis not present

## 2021-01-15 DIAGNOSIS — I132 Hypertensive heart and chronic kidney disease with heart failure and with stage 5 chronic kidney disease, or end stage renal disease: Secondary | ICD-10-CM | POA: Diagnosis not present

## 2021-01-15 HISTORY — PX: APPLICATION OF WOUND VAC: SHX5189

## 2021-01-15 HISTORY — PX: BASCILIC VEIN TRANSPOSITION: SHX5742

## 2021-01-15 LAB — GLUCOSE, CAPILLARY
Glucose-Capillary: 107 mg/dL — ABNORMAL HIGH (ref 70–99)
Glucose-Capillary: 124 mg/dL — ABNORMAL HIGH (ref 70–99)

## 2021-01-15 LAB — POCT I-STAT, CHEM 8
BUN: 30 mg/dL — ABNORMAL HIGH (ref 8–23)
Calcium, Ion: 1.14 mmol/L — ABNORMAL LOW (ref 1.15–1.40)
Chloride: 101 mmol/L (ref 98–111)
Creatinine, Ser: 6.3 mg/dL — ABNORMAL HIGH (ref 0.44–1.00)
Glucose, Bld: 98 mg/dL (ref 70–99)
HCT: 39 % (ref 36.0–46.0)
Hemoglobin: 13.3 g/dL (ref 12.0–15.0)
Potassium: 4.9 mmol/L (ref 3.5–5.1)
Sodium: 136 mmol/L (ref 135–145)
TCO2: 27 mmol/L (ref 22–32)

## 2021-01-15 SURGERY — TRANSPOSITION, VEIN, BASILIC
Anesthesia: Monitor Anesthesia Care | Site: Arm Upper | Laterality: Right

## 2021-01-15 MED ORDER — MIDAZOLAM HCL 2 MG/2ML IJ SOLN: 0.5000 mg | Freq: Once | INTRAMUSCULAR | Status: DC | PRN

## 2021-01-15 MED ORDER — CHLORHEXIDINE GLUCONATE 4 % EX LIQD: 60.0000 mL | Freq: Once | CUTANEOUS | Status: DC

## 2021-01-15 MED ORDER — OXYCODONE HCL 5 MG PO TABS: 5.0000 mg | ORAL_TABLET | Freq: Once | ORAL | Status: DC | PRN

## 2021-01-15 MED ORDER — PROPOFOL 500 MG/50ML IV EMUL
INTRAVENOUS | Status: DC | PRN
  Administered 2021-01-15: 80 ug/kg/min via INTRAVENOUS

## 2021-01-15 MED ORDER — LIDOCAINE-EPINEPHRINE (PF) 1.5 %-1:200000 IJ SOLN
INTRAMUSCULAR | Status: DC | PRN
  Administered 2021-01-15: 30 mg via PERINEURAL

## 2021-01-15 MED ORDER — PHENYLEPHRINE 40 MCG/ML (10ML) SYRINGE FOR IV PUSH (FOR BLOOD PRESSURE SUPPORT)
PREFILLED_SYRINGE | INTRAVENOUS | Status: AC
  Filled 2021-01-15: qty 10

## 2021-01-15 MED ORDER — LIDOCAINE HCL 1 % IJ SOLN
INTRAMUSCULAR | Status: DC | PRN
  Administered 2021-01-15: 4 mL

## 2021-01-15 MED ORDER — LIDOCAINE HCL 1 % IJ SOLN
INTRAMUSCULAR | Status: AC
  Filled 2021-01-15: qty 20

## 2021-01-15 MED ORDER — CHLORHEXIDINE GLUCONATE 0.12 % MT SOLN
OROMUCOSAL | Status: AC
  Administered 2021-01-15: 15 mL via OROMUCOSAL
  Filled 2021-01-15: qty 15

## 2021-01-15 MED ORDER — SODIUM CHLORIDE 0.9 % IV SOLN: INTRAVENOUS | Status: DC

## 2021-01-15 MED ORDER — OXYCODONE HCL 5 MG/5ML PO SOLN: 5.0000 mg | Freq: Once | ORAL | Status: DC | PRN

## 2021-01-15 MED ORDER — HEPARIN SODIUM (PORCINE) 1000 UNIT/ML IJ SOLN
1.9000 mL | Freq: Once | INTRAMUSCULAR | Status: AC
  Administered 2021-01-15: 1900 [IU]
  Filled 2021-01-15: qty 1.9

## 2021-01-15 MED ORDER — 0.9 % SODIUM CHLORIDE (POUR BTL) OPTIME
TOPICAL | Status: DC | PRN
  Administered 2021-01-15: 1000 mL

## 2021-01-15 MED ORDER — ONDANSETRON HCL 4 MG/2ML IJ SOLN
INTRAMUSCULAR | Status: AC
  Filled 2021-01-15: qty 2

## 2021-01-15 MED ORDER — CEFAZOLIN SODIUM-DEXTROSE 2-4 GM/100ML-% IV SOLN
2.0000 g | INTRAVENOUS | Status: AC
  Administered 2021-01-15: 2 g via INTRAVENOUS
  Filled 2021-01-15: qty 100

## 2021-01-15 MED ORDER — OXYCODONE-ACETAMINOPHEN 5-325 MG PO TABS: 1.0000 | ORAL_TABLET | Freq: Four times a day (QID) | ORAL | 0 refills | Status: DC | PRN

## 2021-01-15 MED ORDER — SODIUM CHLORIDE 0.9 % IV SOLN
INTRAVENOUS | Status: DC | PRN
  Administered 2021-01-15: 500 mL

## 2021-01-15 MED ORDER — MEPERIDINE HCL 25 MG/ML IJ SOLN: 6.2500 mg | INTRAMUSCULAR | Status: DC | PRN

## 2021-01-15 MED ORDER — ALBUMIN HUMAN 5 % IV SOLN
INTRAVENOUS | Status: AC
  Filled 2021-01-15: qty 250

## 2021-01-15 MED ORDER — SODIUM CHLORIDE 0.9 % IV SOLN
INTRAVENOUS | Status: AC
  Filled 2021-01-15: qty 1.2

## 2021-01-15 MED ORDER — FENTANYL CITRATE (PF) 250 MCG/5ML IJ SOLN
INTRAMUSCULAR | Status: AC
  Filled 2021-01-15: qty 5

## 2021-01-15 MED ORDER — CHLORHEXIDINE GLUCONATE 0.12 % MT SOLN: 15.0000 mL | Freq: Once | OROMUCOSAL | Status: AC

## 2021-01-15 MED ORDER — ACETAMINOPHEN 500 MG PO TABS
1000.0000 mg | ORAL_TABLET | Freq: Once | ORAL | Status: AC
  Administered 2021-01-15: 1000 mg via ORAL
  Filled 2021-01-15: qty 2

## 2021-01-15 MED ORDER — PHENYLEPHRINE HCL-NACL 10-0.9 MG/250ML-% IV SOLN
INTRAVENOUS | Status: DC | PRN
  Administered 2021-01-15: 30 ug/min via INTRAVENOUS

## 2021-01-15 MED ORDER — FENTANYL CITRATE (PF) 250 MCG/5ML IJ SOLN
INTRAMUSCULAR | Status: DC | PRN
  Administered 2021-01-15 (×2): 25 ug via INTRAVENOUS
  Administered 2021-01-15: 50 ug via INTRAVENOUS

## 2021-01-15 MED ORDER — ALBUMIN HUMAN 5 % IV SOLN
12.5000 g | Freq: Once | INTRAVENOUS | Status: AC
  Administered 2021-01-15: 12.5 g via INTRAVENOUS

## 2021-01-15 MED ORDER — FENTANYL CITRATE (PF) 100 MCG/2ML IJ SOLN
25.0000 ug | INTRAMUSCULAR | Status: DC | PRN
  Administered 2021-01-15: 50 ug via INTRAVENOUS

## 2021-01-15 MED ORDER — PHENYLEPHRINE 40 MCG/ML (10ML) SYRINGE FOR IV PUSH (FOR BLOOD PRESSURE SUPPORT)
PREFILLED_SYRINGE | INTRAVENOUS | Status: DC | PRN
  Administered 2021-01-15 (×2): 80 ug via INTRAVENOUS

## 2021-01-15 MED ORDER — ORAL CARE MOUTH RINSE: 15.0000 mL | Freq: Once | OROMUCOSAL | Status: AC

## 2021-01-15 MED ORDER — ONDANSETRON HCL 4 MG/2ML IJ SOLN
INTRAMUSCULAR | Status: DC | PRN
  Administered 2021-01-15: 4 mg via INTRAVENOUS

## 2021-01-15 MED ORDER — PROMETHAZINE HCL 25 MG/ML IJ SOLN: 6.2500 mg | INTRAMUSCULAR | Status: DC | PRN

## 2021-01-15 MED ORDER — AMIODARONE HCL 200 MG PO TABS: 200.0000 mg | ORAL_TABLET | Freq: Every morning | ORAL | Status: AC

## 2021-01-15 MED ORDER — FENTANYL CITRATE (PF) 100 MCG/2ML IJ SOLN
INTRAMUSCULAR | Status: AC
  Filled 2021-01-15: qty 2

## 2021-01-15 SURGICAL SUPPLY — 45 items
AGENT HMST SPONGE THK3/8 (HEMOSTASIS)
ARMBAND PINK RESTRICT EXTREMIT (MISCELLANEOUS) ×2 IMPLANT
BNDG CMPR MED 10X6 ELC LF (GAUZE/BANDAGES/DRESSINGS) ×1
BNDG ELASTIC 4X5.8 VLCR STR LF (GAUZE/BANDAGES/DRESSINGS) ×1 IMPLANT
BNDG ELASTIC 6X10 VLCR STRL LF (GAUZE/BANDAGES/DRESSINGS) ×1 IMPLANT
CANISTER SUCT 3000ML PPV (MISCELLANEOUS) ×2 IMPLANT
CLIP VESOCCLUDE MED 24/CT (CLIP) ×2 IMPLANT
CLIP VESOCCLUDE SM WIDE 24/CT (CLIP) ×2 IMPLANT
COVER PROBE W GEL 5X96 (DRAPES) ×2 IMPLANT
COVER WAND RF STERILE (DRAPES) ×2 IMPLANT
DECANTER SPIKE VIAL GLASS SM (MISCELLANEOUS) ×2 IMPLANT
DRAIN JP 15F RND RADIO PRF (DRAIN) ×1 IMPLANT
ELECT REM PT RETURN 9FT ADLT (ELECTROSURGICAL) ×2
ELECTRODE REM PT RTRN 9FT ADLT (ELECTROSURGICAL) ×1 IMPLANT
EVACUATOR SILICONE 100CC (DRAIN) ×1 IMPLANT
GLOVE BIO SURGEON STRL SZ7.5 (GLOVE) ×2 IMPLANT
GLOVE SRG 8 PF TXTR STRL LF DI (GLOVE) ×1 IMPLANT
GLOVE SURG UNDER POLY LF SZ8 (GLOVE) ×2
GOWN STRL REUS W/ TWL LRG LVL3 (GOWN DISPOSABLE) ×2 IMPLANT
GOWN STRL REUS W/ TWL XL LVL3 (GOWN DISPOSABLE) ×2 IMPLANT
GOWN STRL REUS W/TWL LRG LVL3 (GOWN DISPOSABLE) ×4
GOWN STRL REUS W/TWL XL LVL3 (GOWN DISPOSABLE) ×4
HEMOSTAT SPONGE AVITENE ULTRA (HEMOSTASIS) IMPLANT
KIT BASIN OR (CUSTOM PROCEDURE TRAY) ×2 IMPLANT
KIT DRSG PREVENA PLUS 7DAY 125 (MISCELLANEOUS) ×1 IMPLANT
KIT PREVENA INCISION MGT20CM45 (CANNISTER) ×1 IMPLANT
KIT TURNOVER KIT B (KITS) ×2 IMPLANT
NDL HYPO 25GX1X1/2 BEV (NEEDLE) ×1 IMPLANT
NEEDLE HYPO 25GX1X1/2 BEV (NEEDLE) ×2 IMPLANT
NS IRRIG 1000ML POUR BTL (IV SOLUTION) ×2 IMPLANT
PACK CV ACCESS (CUSTOM PROCEDURE TRAY) ×2 IMPLANT
PAD ARMBOARD 7.5X6 YLW CONV (MISCELLANEOUS) ×4 IMPLANT
SLING ARM IMMOBILIZER XL (CAST SUPPLIES) ×1 IMPLANT
SUT ETHILON 3 0 PS 1 (SUTURE) ×4 IMPLANT
SUT MNCRL AB 4-0 PS2 18 (SUTURE) ×2 IMPLANT
SUT PROLENE 6 0 BV (SUTURE) ×5 IMPLANT
SUT PROLENE 7 0 BV 1 (SUTURE) IMPLANT
SUT SILK 2 0 SH (SUTURE) ×1 IMPLANT
SUT VIC AB 2-0 CT1 27 (SUTURE) ×4
SUT VIC AB 2-0 CT1 TAPERPNT 27 (SUTURE) ×1 IMPLANT
SUT VIC AB 3-0 SH 27 (SUTURE) ×4
SUT VIC AB 3-0 SH 27X BRD (SUTURE) ×2 IMPLANT
TOWEL GREEN STERILE (TOWEL DISPOSABLE) ×2 IMPLANT
UNDERPAD 30X36 HEAVY ABSORB (UNDERPADS AND DIAPERS) ×2 IMPLANT
WATER STERILE IRR 1000ML POUR (IV SOLUTION) ×2 IMPLANT

## 2021-01-15 NOTE — Discharge Instructions (Signed)
° °  [facility] [place] ° °Discharge Instructions ° °AV Fistula or Graft Surgery for Dialysis Access ° °Please refer to the following instructions for your post-procedure care. Your surgeon or physician assistant will discuss any changes with you. ° °Activity ° °You may drive the day following your surgery, if you are comfortable and no longer taking prescription pain medication. Resume full activity as the soreness in your incision resolves. ° °Bathing/Showering ° °You may shower after you go home. Keep your incision dry for 48 hours. Do not soak in a bathtub, hot tub, or swim until the incision heals completely. You may not shower if you have a hemodialysis catheter. ° °Incision Care ° °Clean your incision with mild soap and water after 48 hours. Pat the area dry with a clean towel. You do not need a bandage unless otherwise instructed. Do not apply any ointments or creams to your incision. You may have skin glue on your incision. Do not peel it off. It will come off on its own in about one week. Your arm may swell a bit after surgery. To reduce swelling use pillows to elevate your arm so it is above your heart. Your doctor will tell you if you need to lightly wrap your arm with an ACE bandage. ° °Diet ° °Resume your normal diet. There are not special food restrictions following this procedure. In order to heal from your surgery, it is CRITICAL to get adequate nutrition. Your body requires vitamins, minerals, and protein. Vegetables are the best source of vitamins and minerals. Vegetables also provide the perfect balance of protein. Processed food has little nutritional value, so try to avoid this. ° °Medications ° °Resume taking all of your medications. If your incision is causing pain, you may take over-the counter pain relievers such as acetaminophen (Tylenol). If you were prescribed a stronger pain medication, please be aware these medications can cause nausea and constipation. Prevent  nausea by taking the medication with a snack or meal. Avoid constipation by drinking plenty of fluids and eating foods with high amount of fiber, such as fruits, vegetables, and grains. Do not take Tylenol if you are taking prescription pain medications. ° ° ° ° °Follow up °Your surgeon may want to see you in the office following your access surgery. If so, this will be arranged at the time of your surgery. ° °Please call us immediately for any of the following conditions: ° °Increased pain, redness, drainage (pus) from your incision site °Fever of 101 degrees or higher °Severe or worsening pain at your incision site °Hand pain or numbness. ° °Reduce your risk of vascular disease: ° °Stop smoking. If you would like help, call [facility] at 1-800-QUIT-NOW (1-800-784-8669) or [place] at 336-586-4000 ° °Manage your cholesterol °Maintain a desired weight °Control your diabetes °Keep your blood pressure down ° °Dialysis ° °It will take several weeks to several months for your new dialysis access to be ready for use. Your surgeon will determine when it is OK to use it. Your nephrologist will continue to direct your dialysis. You can continue to use your Permcath until your new access is ready for use. ° °If you have any questions, please call the office at [phone]. ° °

## 2021-01-15 NOTE — Interval H&P Note (Signed)
History and Physical Interval Note:  01/15/2021 7:17 AM  [name]  has presented today for surgery, with the diagnosis of ESRD.  The various methods of treatment have been discussed with the patient and family. After consideration of risks, benefits and other options for treatment, the patient has consented to  Procedure(s) with comments: RIGHT UPPER ARM SECOND STAGE [place] (Right) - PERIPHERAL NERVE BLOCK as a surgical intervention.  The patient's history has been reviewed, patient examined, no change in status, stable for surgery.  I have reviewed the patient's chart and labs.  Questions were answered to the patient's satisfaction.     Cherre Robins

## 2021-01-15 NOTE — Transfer of Care (Signed)
Immediate Anesthesia Transfer of Care Note  Patient: [name]  Procedure(s) Performed: RIGHT UPPER ARM SECOND STAGE [place] (Right: Arm Upper) APPLICATION OF WOUND VAC (Right: Arm Upper)  Patient Location: PACU  Anesthesia Type:MAC and Regional  Level of Consciousness: awake and alert   Airway & Oxygen Therapy: Patient Spontanous Breathing and Patient connected to face mask  Post-op Assessment: Report given to RN and Post -op Vital signs reviewed and stable  Post vital signs: Reviewed and stable  Last Vitals:  Vitals Value Taken Time  BP 137/58 01/15/21 0946  Temp    Pulse 69 01/15/21 0947  Resp 20 01/15/21 0947  SpO2 99 % 01/15/21 0947  Vitals shown include unvalidated device data.  Last Pain:  Vitals:   01/15/21 0559  TempSrc: Oral      Patients Stated Pain Goal: 4 (68/03/21 2248)  Complications: No notable events documented.

## 2021-01-15 NOTE — Op Note (Signed)
DATE OF SERVICE: 01/15/2021  PATIENT:  [name]  75 y.o. female  PRE-OPERATIVE DIAGNOSIS:  ESRD in need of HD access  POST-OPERATIVE DIAGNOSIS:  Same  PROCEDURE:   Right second stage brachio-basilic arteriovenous fistula  SURGEON:  Surgeon(s) and Role:    * Cherre Robins, MD - Primary  ASSISTANT: Arlee Muslim, PA-C  An assistant was required to facilitate exposure and expedite the case.  ANESTHESIA:   regional and MAC  EBL: min  BLOOD ADMINISTERED:none  DRAINS: (34F) Jackson-Pratt drain(s) with closed bulb suction in the right arm    LOCAL MEDICATIONS USED:  LIDOCAINE   SPECIMEN:  none  COUNTS: confirmed correct.  TOURNIQUET:  none  PATIENT DISPOSITION:  PACU - hemodynamically stable.   Delay start of Pharmacological VTE agent (>24hrs) due to surgical blood loss or risk of bleeding: no  INDICATION FOR PROCEDURE: [name] is a 75 y.o. female with ESRD in need of permanent HD access. She underwent first stage brahciobasilic AVF 5/73/22 with Dr. Donnetta Hutching. After careful discussion of risks, benefits, and alternatives the patient was offered right upper extremity second stage basilic vein transposition. The patient understood and wished to proceed.  OPERATIVE FINDINGS: adequately matured AVF for second stage transposition. Unremarkable second stage basilic vein transposition.  DESCRIPTION OF PROCEDURE: After identification of the patient in the pre-operative holding area, the patient was transferred to the operating room. The patient was positioned supine on the operating room table. Anesthesia was induced. The right arm was prepped and draped in standard fashion. A surgical pause was performed confirming correct patient, procedure, and operative location.  Using intraoperative ultrasound the course of the right basilic vein was marked on the skin.  3 skip incisions were made over the course of the basilic vein fistula.  These were carried down through  subcutaneous tissue until the fistula was encountered.  The fascia was skeletonized from the axilla to the anastomosis, taking care to ligate and divide sidebranches, and to protect the medial antebrachial cutaneous nerve.   A subcutaneous tunnel was created over the biceps using a sheath tunneling device.  Patient was heparinized.  The fistula near the anastomosis was clamped.  The outflow in the axilla was clamped.  The fistula was divided.  The fistula was marked to ensure no twisting or kinking while delivering the fistula through the tunnel.  The fistula was tunneled through the arm and delivered near the previous anastomosis.  The fistula was spatulated proximally and distally.  The fistula was reanastomosed end-to-end using continuous running suture of 5-0 Prolene.  A palpable thrill was felt over the subcutaneous course of the tunnel.  Doppler flow was excellent in the proximal and distal fistula.  The wounds were copiously irrigated.  Hemostasis was ensured in the surgical bed. A 34F JP drain was placed into the surgical bed. The wounds were closed in layers using 2-0 vicryl, 3-0 Vicryl and 3-O nylon. A prevena dressing was applied to the arm.  Upon completion of the case instrument and sharps counts were confirmed correct. The patient was transferred to the PACU in good condition. I was present for all portions of the procedure.  [name]. Stanford Breed, MD [facility] Phone Number: [phone] 01/15/2021 9:39 AM

## 2021-01-15 NOTE — Anesthesia Procedure Notes (Signed)
Procedure Name: MAC Date/Time: 01/15/2021 7:30 AM Performed by: Barrington Ellison, CRNA Pre-anesthesia Checklist: Patient identified, Emergency Drugs available, Suction available, Patient being monitored and Timeout performed Patient Re-evaluated:Patient Re-evaluated prior to induction Oxygen Delivery Method: Simple face mask

## 2021-01-15 NOTE — Interval H&P Note (Signed)
History and Physical Interval Note:  01/15/2021 7:28 AM  [name]  has presented today for surgery, with the diagnosis of ESRD.  The various methods of treatment have been discussed with the patient and family. After consideration of risks, benefits and other options for treatment, the patient has consented to  Procedure(s) with comments: RIGHT UPPER ARM SECOND STAGE [place] (Right) - PERIPHERAL NERVE BLOCK as a surgical intervention.  The patient's history has been reviewed, patient examined, no change in status, stable for surgery.  I have reviewed the patient's chart and labs.  Questions were answered to the patient's satisfaction.     Cherre Robins

## 2021-01-15 NOTE — Anesthesia Postprocedure Evaluation (Signed)
Anesthesia Post Note  Patient: [name]  Procedure(s) Performed: RIGHT UPPER ARM SECOND STAGE BASCILIC VEIN TRANSPOSITION (Right: Arm Upper) APPLICATION OF WOUND VAC (Right: Arm Upper)     Patient location during evaluation: PACU Anesthesia Type: Regional Level of consciousness: awake and alert, patient cooperative and oriented Pain management: pain level controlled Vital Signs Assessment: post-procedure vital signs reviewed and stable Respiratory status: spontaneous breathing, nonlabored ventilation and respiratory function stable Cardiovascular status: blood pressure returned to baseline and stable Postop Assessment: no apparent nausea or vomiting and adequate PO intake Anesthetic complications: no   No notable events documented.  Last Vitals:  Vitals:   01/15/21 1015 01/15/21 1030  BP: (!) 92/42 (!) 81/31  Pulse: 61 (!) 57  Resp: 17 15  Temp:    SpO2: 92% 97%    Last Pain:  Vitals:   01/15/21 1015  TempSrc:   PainSc: 4                  Traci Gafford,E. Delta Deshmukh

## 2021-01-15 NOTE — Anesthesia Procedure Notes (Signed)
Anesthesia Regional Block: Supraclavicular block   Pre-Anesthetic Checklist: , timeout performed,  Correct Patient, Correct Site, Correct Laterality,  Correct Procedure, Correct Position, site marked,  Risks and benefits discussed,  Surgical consent,  Pre-op evaluation,  At surgeon's request and post-op pain management  Laterality: Right and Upper  Prep: chloraprep       Needles:  Injection technique: Single-shot  Needle Type: Echogenic Needle     Needle Length: 9cm  Needle Gauge: 21     Additional Needles:   Procedures:,,,, ultrasound used (permanent image in chart),,    Narrative:  Start time: 01/15/2021 7:12 AM End time: 01/15/2021 7:19 AM Injection made incrementally with aspirations every 5 mL.  Performed by: Personally  Anesthesiologist: Annye Asa, MD  Additional Notes: Pt identified in Holding room.  Monitors applied. Working IV access confirmed. Sterile prep R clavicle and neck.  #21ga ECHOgenic Arrow block needle to supraclavicular brachial plexus with US guidance.  30cc 1.5% Lidocaine with 1:200k epi injected incrementally after negative test dose.  Patient asymptomatic, VSS, no heme aspirated, tolerated well.   Jenita Seashore, MD

## 2021-01-16 ENCOUNTER — Encounter (HOSPITAL_COMMUNITY): Payer: Self-pay | Admitting: Vascular Surgery

## 2021-01-23 ENCOUNTER — Ambulatory Visit (INDEPENDENT_AMBULATORY_CARE_PROVIDER_SITE_OTHER): Payer: Medicare Other | Admitting: Physician Assistant

## 2021-01-23 ENCOUNTER — Ambulatory Visit: Payer: Medicare Other

## 2021-01-23 ENCOUNTER — Other Ambulatory Visit: Payer: Self-pay

## 2021-01-23 VITALS — BP 123/58 | HR 78 | Temp 97.4°F

## 2021-01-23 DIAGNOSIS — Z992 Dependence on renal dialysis: Secondary | ICD-10-CM

## 2021-01-23 DIAGNOSIS — N186 End stage renal disease: Secondary | ICD-10-CM

## 2021-01-23 NOTE — Progress Notes (Signed)
POST OPERATIVE DIALYSIS ACCESS OFFICE NOTE    CC:  F/u for dialysis access surgery  HPI:  This is a 75 y.o. female who is s/p right second stage brachiobasilic vein transposition with placement of Jackson-Pratt surgical drain and Prevena dressing on January 15, 2021 by Dr. Stanford Breed.  She denies hand pain, numbness, tingling, fever or chills.  She brings her drain output chart which has gradually decreased from 10 cc/day to less than 1 cc.  She has been wearing a right arm sling her Prevena dressing since surgery.  The dressing remained with good suction until last night.  She had failed two left upper extremity access procedures in the past. 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 She has a right IJ port for iron infusions as well. Venogram performed by Dr. Stanford Breed 11/22/2020 with no clear central stenosis from right IJ port. This was subsequently removed and she underwent concomitant creation of right 1st stage brachiobasilic AVF.   Dialysis days:  TTS   Dialysis center:  [place]  No Known Allergies  Current Outpatient Medications  Medication Sig Dispense Refill   acetaminophen (TYLENOL) 325 MG tablet Take 650 mg by mouth daily as needed for headache (pain).     albuterol (PROVENTIL) (2.5 MG/3ML) 0.083% nebulizer solution Take 2.5 mg by nebulization 4 (four) times daily as needed for wheezing or shortness of breath.      allopurinol (ZYLOPRIM) 100 MG tablet Take 1 tablet (100 mg total) by mouth 2 (two) times daily.     amiodarone (PACERONE) 200 MG tablet Take 1 tablet (200 mg total) by mouth in the morning.     apixaban (ELIQUIS) 2.5 MG TABS tablet Take 2.5 mg by mouth 2 (two) times daily.     aspirin EC 81 MG tablet Take 81 mg by mouth in the morning.     atorvastatin (LIPITOR) 40 MG tablet Take 1 tablet (40 mg total) by mouth daily in the afternoon. In the afternoon     Biotin 5000 MCG TABS Take 5,000 mcg by mouth in the morning.     Camphor-Menthol-Methyl  Sal (SALONPAS) 3.08-10-08 % PTCH Place 1 patch onto the skin daily as needed (pain).     Carboxymethylcellul-Glycerin (CLEAR EYES FOR DRY EYES) 1-0.25 % SOLN Place 1 drop into both eyes daily as needed (dry eys).     Cholecalciferol (VITAMIN D3) 50 MCG (2000 UT) TABS Take 2,000 Units by mouth in the morning.     doxercalciferol (HECTOROL) 0.5 MCG capsule Take 0.5 mcg by mouth every dialysis.     esomeprazole (NEXIUM) 40 MG capsule Take 40 mg by mouth in the morning.     ferric citrate (AURYXIA) 1 GM 210 MG(Fe) tablet Take 210 mg by mouth 3 (three) times daily with meals.     Fluticasone-Umeclidin-Vilant (TRELEGY ELLIPTA) 100-62.5-25 MCG/INH AEPB Inhale 1 puff into the lungs daily at 12 noon.     gabapentin (NEURONTIN) 100 MG capsule Take 100 mg by mouth at bedtime.     insulin glargine (LANTUS) 100 unit/mL SOPN Inject into the skin daily.     insulin lispro (HUMALOG) 100 UNIT/ML injection Inject 5-16 Units into the skin 3 (three) times daily as needed (greater than 120). 11/28/20- "has not take in a while".     lidocaine (LIDODERM) 5 % Place 1 patch onto the skin daily as needed (pain.). Remove & Discard patch within 12 hours or as directed by MD     Methoxy PEG-Epoetin  Beta (MIRCERA IJ) Mircera     midodrine (PROAMATINE) 10 MG tablet Take 1 tablet (10 mg total) by mouth 3 (three) times daily with meals.     naproxen sodium (ALEVE) 220 MG tablet Take 220 mg by mouth daily as needed (pain).     nitroGLYCERIN (NITROSTAT) 0.4 MG SL tablet Place 0.4 mg under the tongue every 5 (five) minutes x 3 doses as needed for chest pain.     ondansetron (ZOFRAN) 4 MG tablet Take 4 mg by mouth every 8 (eight) hours as needed for nausea/vomiting, nausea or vomiting.     oxyCODONE-acetaminophen (PERCOCET) 5-325 MG tablet Take 1 tablet by mouth every 6 (six) hours as needed for severe pain. 20 tablet 0   OXYGEN Inhale 2 L into the lungs See admin instructions. Use every night and as needed during the day      Phenylephrine-DM-GG-APAP (MUCINEX SINUS-MAX) 5-10-200-325 MG TABS Take 1 tablet by mouth 2 (two) times daily as needed (sinus issues).     PRESCRIPTION MEDICATION Inhale into the lungs at bedtime. Bipap     Current Facility-Administered Medications  Medication Dose Route Frequency Provider Last Rate Last Admin   0.9 %  sodium chloride infusion  250 mL Intravenous PRN Cherre Robins, MD       sodium chloride flush (NS) 0.9 % injection 3 mL  3 mL Intravenous Q12H Cherre Robins, MD       sodium chloride flush (NS) 0.9 % injection 3 mL  3 mL Intravenous PRN Cherre Robins, MD         ROS:  See HPI  LMP  (LMP Unknown)   There were no vitals filed for this visit.   Physical Exam:  General appearance: awake, alert in NAD Chest: catheter site without bleeding or erythema. Dressing dry and intact. Respirations: unlabored; no dyspnea at rest Right upper extremity: Hand is warm with 2/5 grip strength. Sensation intact. Good bruit and thrill in fistula. 2+ radial pulse.  No output in drain.  This was removed. Incision(s): Well approximated. No active bleeding or hematoma.   Dialysis duplex on 01/23/2021 N/a  Assessment/Plan: 9 weeks postop right brachiobasilic vein transposition.  Her incisions are healing nicely.  She has extremely poor integrity to her skin and sutures were left in place today.  Once her Ace wrap was removed today her forearm and upper arm are without significant edema, however she had considerable right hand edema and Ace wrap was replaced to the elbow.  I advised her to use her right arm and use range of motion exercises as much as possible.  Also encouraged use of right hand exercise ball.  Follow-up in 2 to 3 weeks for suture removal.    Barbie Banner, PA-C 01/23/2021 9:06 AM [facility] [phone]  Clinic MD: Dr. Stanford Breed

## 2021-02-06 ENCOUNTER — Other Ambulatory Visit: Payer: Self-pay

## 2021-02-06 ENCOUNTER — Ambulatory Visit (INDEPENDENT_AMBULATORY_CARE_PROVIDER_SITE_OTHER): Payer: Medicare Other | Admitting: Vascular Surgery

## 2021-02-06 ENCOUNTER — Encounter: Payer: Self-pay | Admitting: Vascular Surgery

## 2021-02-06 VITALS — BP 118/68 | HR 75 | Temp 97.6°F | Resp 20 | Ht <= 58 in | Wt 159.0 lb

## 2021-02-06 DIAGNOSIS — Z992 Dependence on renal dialysis: Secondary | ICD-10-CM

## 2021-02-06 DIAGNOSIS — N186 End stage renal disease: Secondary | ICD-10-CM

## 2021-02-06 NOTE — Progress Notes (Signed)
[facility] [place] PROGRESS NOTE  ASSESSMENT / PLAN: [name] is a 75 y.o. female status post right second stage brachio-basilic arteriovenous fistula 01/15/21. Sutures removed today. Follow up with PA in 4-8 weeks with AVF duplex.  SUBJECTIVE: Doing well. No issues in R hand. Wounds healing well.  OBJECTIVE: BP 118/68 (BP Location: Left Arm, Patient Position: Sitting, Cuff Size: Large)   Pulse 75   Temp 97.6 F (36.4 C)   Resp 20   Ht 4\' 10"  (1.473 m)   Wt 159 lb (72.1 kg)   LMP  (LMP Unknown)   SpO2 96%   BMI 33.23 kg/m  No distress Regular rate and rhythm RUE incisions healed  CBC Latest Ref Rng & Units 01/15/2021 11/29/2020 11/29/2020  WBC 4.0 - 10.5 K/uL - - -  Hemoglobin 12.0 - 15.0 g/dL 13.3 12.6 12.6  Hematocrit 36.0 - 46.0 % 39.0 37.0 37.0  Platelets 150 - 400 K/uL - - -     CMP Latest Ref Rng & Units 01/15/2021 11/29/2020 11/29/2020  Glucose 70 - 99 mg/dL 98 113(H) 111(H)  BUN 8 - 23 mg/dL 30(H) 27(H) 32(H)  Creatinine 0.44 - 1.00 mg/dL 6.30(H) 5.80(H) 5.50(H)  Sodium 135 - 145 mmol/L 136 133(L) 132(L)  Potassium 3.5 - 5.1 mmol/L 4.9 5.4(H) 7.5(HH)  Chloride 98 - 111 mmol/L 101 104 103  CO2 22 - 32 mmol/L - - -  Calcium 8.9 - 10.3 mg/dL - - -  Total Protein 6.5 - 8.1 g/dL - - -  Total Bilirubin 0.3 - 1.2 mg/dL - - -  Alkaline Phos 38 - 126 U/L - - -  AST 15 - 41 U/L - - -  ALT 0 - 44 U/L - - -    CrCl cannot be calculated (Patient's most recent lab result is older than the maximum 21 days allowed.).  [name]. Stanford Breed, MD [facility] Phone Number: [phone] 02/06/2021 8:17 AM

## 2021-02-08 ENCOUNTER — Other Ambulatory Visit: Payer: Self-pay

## 2021-02-08 DIAGNOSIS — N186 End stage renal disease: Secondary | ICD-10-CM

## 2021-02-08 DIAGNOSIS — Z992 Dependence on renal dialysis: Secondary | ICD-10-CM

## 2021-02-25 ENCOUNTER — Emergency Department (HOSPITAL_COMMUNITY): Payer: Medicare Other

## 2021-02-25 ENCOUNTER — Inpatient Hospital Stay (HOSPITAL_COMMUNITY)
Admission: EM | Admit: 2021-02-25 | Discharge: 2021-02-28 | DRG: 377 | Disposition: A | Discharge status: Home / Self Care | Payer: Medicare Other | Attending: Internal Medicine | Admitting: Internal Medicine

## 2021-02-25 ENCOUNTER — Encounter (HOSPITAL_COMMUNITY): Payer: Self-pay | Admitting: Emergency Medicine

## 2021-02-25 DIAGNOSIS — D649 Anemia, unspecified: Secondary | ICD-10-CM

## 2021-02-25 DIAGNOSIS — I480 Paroxysmal atrial fibrillation: Secondary | ICD-10-CM | POA: Diagnosis present

## 2021-02-25 DIAGNOSIS — Z7901 Long term (current) use of anticoagulants: Secondary | ICD-10-CM

## 2021-02-25 DIAGNOSIS — E1142 Type 2 diabetes mellitus with diabetic polyneuropathy: Secondary | ICD-10-CM | POA: Diagnosis not present

## 2021-02-25 DIAGNOSIS — E8889 Other specified metabolic disorders: Secondary | ICD-10-CM | POA: Diagnosis present

## 2021-02-25 DIAGNOSIS — I132 Hypertensive heart and chronic kidney disease with heart failure and with stage 5 chronic kidney disease, or end stage renal disease: Secondary | ICD-10-CM | POA: Diagnosis present

## 2021-02-25 DIAGNOSIS — D62 Acute posthemorrhagic anemia: Secondary | ICD-10-CM | POA: Diagnosis present

## 2021-02-25 DIAGNOSIS — Z9981 Dependence on supplemental oxygen: Secondary | ICD-10-CM

## 2021-02-25 DIAGNOSIS — Z833 Family history of diabetes mellitus: Secondary | ICD-10-CM

## 2021-02-25 DIAGNOSIS — Z90710 Acquired absence of both cervix and uterus: Secondary | ICD-10-CM

## 2021-02-25 DIAGNOSIS — K2100 Gastro-esophageal reflux disease with esophagitis, without bleeding: Secondary | ICD-10-CM | POA: Diagnosis present

## 2021-02-25 DIAGNOSIS — E782 Mixed hyperlipidemia: Secondary | ICD-10-CM | POA: Diagnosis present

## 2021-02-25 DIAGNOSIS — Z87891 Personal history of nicotine dependence: Secondary | ICD-10-CM

## 2021-02-25 DIAGNOSIS — G4733 Obstructive sleep apnea (adult) (pediatric): Secondary | ICD-10-CM | POA: Diagnosis present

## 2021-02-25 DIAGNOSIS — K2951 Unspecified chronic gastritis with bleeding: Principal | ICD-10-CM | POA: Diagnosis present

## 2021-02-25 DIAGNOSIS — Z8616 Personal history of COVID-19: Secondary | ICD-10-CM

## 2021-02-25 DIAGNOSIS — J9611 Chronic respiratory failure with hypoxia: Secondary | ICD-10-CM | POA: Diagnosis not present

## 2021-02-25 DIAGNOSIS — Z825 Family history of asthma and other chronic lower respiratory diseases: Secondary | ICD-10-CM

## 2021-02-25 DIAGNOSIS — K922 Gastrointestinal hemorrhage, unspecified: Secondary | ICD-10-CM | POA: Diagnosis not present

## 2021-02-25 DIAGNOSIS — Z86010 Personal history of colonic polyps: Secondary | ICD-10-CM

## 2021-02-25 DIAGNOSIS — E785 Hyperlipidemia, unspecified: Secondary | ICD-10-CM | POA: Diagnosis present

## 2021-02-25 DIAGNOSIS — Z20822 Contact with and (suspected) exposure to covid-19: Secondary | ICD-10-CM | POA: Diagnosis present

## 2021-02-25 DIAGNOSIS — Z8049 Family history of malignant neoplasm of other genital organs: Secondary | ICD-10-CM

## 2021-02-25 DIAGNOSIS — Z841 Family history of disorders of kidney and ureter: Secondary | ICD-10-CM

## 2021-02-25 DIAGNOSIS — Z7982 Long term (current) use of aspirin: Secondary | ICD-10-CM

## 2021-02-25 DIAGNOSIS — Z8249 Family history of ischemic heart disease and other diseases of the circulatory system: Secondary | ICD-10-CM

## 2021-02-25 DIAGNOSIS — Z8541 Personal history of malignant neoplasm of cervix uteri: Secondary | ICD-10-CM

## 2021-02-25 DIAGNOSIS — J449 Chronic obstructive pulmonary disease, unspecified: Secondary | ICD-10-CM | POA: Diagnosis present

## 2021-02-25 DIAGNOSIS — I953 Hypotension of hemodialysis: Secondary | ICD-10-CM | POA: Diagnosis present

## 2021-02-25 DIAGNOSIS — Z90711 Acquired absence of uterus with remaining cervical stump: Secondary | ICD-10-CM

## 2021-02-25 DIAGNOSIS — M109 Gout, unspecified: Secondary | ICD-10-CM | POA: Diagnosis present

## 2021-02-25 DIAGNOSIS — E1122 Type 2 diabetes mellitus with diabetic chronic kidney disease: Secondary | ICD-10-CM | POA: Diagnosis present

## 2021-02-25 DIAGNOSIS — R1310 Dysphagia, unspecified: Secondary | ICD-10-CM | POA: Diagnosis present

## 2021-02-25 DIAGNOSIS — E669 Obesity, unspecified: Secondary | ICD-10-CM | POA: Diagnosis present

## 2021-02-25 DIAGNOSIS — K449 Diaphragmatic hernia without obstruction or gangrene: Secondary | ICD-10-CM | POA: Diagnosis present

## 2021-02-25 DIAGNOSIS — Z794 Long term (current) use of insulin: Secondary | ICD-10-CM

## 2021-02-25 DIAGNOSIS — E1165 Type 2 diabetes mellitus with hyperglycemia: Secondary | ICD-10-CM | POA: Diagnosis present

## 2021-02-25 DIAGNOSIS — I2510 Atherosclerotic heart disease of native coronary artery without angina pectoris: Secondary | ICD-10-CM | POA: Diagnosis present

## 2021-02-25 DIAGNOSIS — Z800 Family history of malignant neoplasm of digestive organs: Secondary | ICD-10-CM

## 2021-02-25 DIAGNOSIS — Z823 Family history of stroke: Secondary | ICD-10-CM

## 2021-02-25 DIAGNOSIS — I959 Hypotension, unspecified: Secondary | ICD-10-CM | POA: Diagnosis present

## 2021-02-25 DIAGNOSIS — Z9049 Acquired absence of other specified parts of digestive tract: Secondary | ICD-10-CM

## 2021-02-25 DIAGNOSIS — Z6834 Body mass index (BMI) 34.0-34.9, adult: Secondary | ICD-10-CM

## 2021-02-25 DIAGNOSIS — Z992 Dependence on renal dialysis: Secondary | ICD-10-CM

## 2021-02-25 DIAGNOSIS — K222 Esophageal obstruction: Secondary | ICD-10-CM | POA: Diagnosis present

## 2021-02-25 DIAGNOSIS — Z79899 Other long term (current) drug therapy: Secondary | ICD-10-CM

## 2021-02-25 DIAGNOSIS — Z981 Arthrodesis status: Secondary | ICD-10-CM

## 2021-02-25 DIAGNOSIS — R195 Other fecal abnormalities: Secondary | ICD-10-CM | POA: Diagnosis not present

## 2021-02-25 DIAGNOSIS — N186 End stage renal disease: Secondary | ICD-10-CM | POA: Diagnosis present

## 2021-02-25 LAB — RESP PANEL BY RT-PCR (FLU A&B, COVID) ARPGX2
Influenza A by PCR: NEGATIVE
Influenza B by PCR: NEGATIVE
SARS Coronavirus 2 by RT PCR: NEGATIVE

## 2021-02-25 LAB — CBC WITH DIFFERENTIAL/PLATELET
Abs Immature Granulocytes: 0.31 10*3/uL — ABNORMAL HIGH (ref 0.00–0.07)
Basophils Absolute: 0.1 10*3/uL (ref 0.0–0.1)
Basophils Relative: 1 %
Eosinophils Absolute: 0.2 10*3/uL (ref 0.0–0.5)
Eosinophils Relative: 2 %
HCT: 32.3 % — ABNORMAL LOW (ref 36.0–46.0)
Hemoglobin: 10.2 g/dL — ABNORMAL LOW (ref 12.0–15.0)
Immature Granulocytes: 4 %
Lymphocytes Relative: 12 %
Lymphs Abs: 1.1 10*3/uL (ref 0.7–4.0)
MCH: 29.3 pg (ref 26.0–34.0)
MCHC: 31.6 g/dL (ref 30.0–36.0)
MCV: 92.8 fL (ref 80.0–100.0)
Monocytes Absolute: 0.8 10*3/uL (ref 0.1–1.0)
Monocytes Relative: 9 %
Neutro Abs: 6.4 10*3/uL (ref 1.7–7.7)
Neutrophils Relative %: 72 %
Platelets: 352 10*3/uL (ref 150–400)
RBC: 3.48 MIL/uL — ABNORMAL LOW (ref 3.87–5.11)
RDW: 15.7 % — ABNORMAL HIGH (ref 11.5–15.5)
WBC: 8.7 10*3/uL (ref 4.0–10.5)
nRBC: 0 % (ref 0.0–0.2)

## 2021-02-25 LAB — COMPREHENSIVE METABOLIC PANEL
ALT: 9 U/L (ref 0–44)
AST: 13 U/L — ABNORMAL LOW (ref 15–41)
Albumin: 2.4 g/dL — ABNORMAL LOW (ref 3.5–5.0)
Alkaline Phosphatase: 85 U/L (ref 38–126)
Anion gap: 12 (ref 5–15)
BUN: 11 mg/dL (ref 8–23)
CO2: 28 mmol/L (ref 22–32)
Calcium: 9.2 mg/dL (ref 8.9–10.3)
Chloride: 97 mmol/L — ABNORMAL LOW (ref 98–111)
Creatinine, Ser: 3.58 mg/dL — ABNORMAL HIGH (ref 0.44–1.00)
GFR, Estimated: 13 mL/min — ABNORMAL LOW (ref >60)
Glucose, Bld: 109 mg/dL — ABNORMAL HIGH (ref 70–99)
Potassium: 3.8 mmol/L (ref 3.5–5.1)
Sodium: 137 mmol/L (ref 135–145)
Total Bilirubin: 0.6 mg/dL (ref 0.3–1.2)
Total Protein: 6.3 g/dL — ABNORMAL LOW (ref 6.5–8.1)

## 2021-02-25 LAB — HEMOGLOBIN AND HEMATOCRIT, BLOOD
HCT: 30.8 % — ABNORMAL LOW (ref 36.0–46.0)
Hemoglobin: 10 g/dL — ABNORMAL LOW (ref 12.0–15.0)

## 2021-02-25 LAB — LIPASE, BLOOD: Lipase: 35 U/L (ref 11–51)

## 2021-02-25 LAB — POC OCCULT BLOOD, ED: Fecal Occult Bld: POSITIVE — AB

## 2021-02-25 LAB — TYPE AND SCREEN
ABO/RH(D): O POS
Antibody Screen: NEGATIVE

## 2021-02-25 MED ORDER — FLUTICASONE PROPIONATE 50 MCG/ACT NA SUSP
1.0000 | Freq: Every day | NASAL | Status: DC
  Administered 2021-02-28: 1 via NASAL

## 2021-02-25 MED ORDER — ACETAMINOPHEN 325 MG PO TABS: 650.0000 mg | ORAL_TABLET | Freq: Four times a day (QID) | ORAL | Status: DC | PRN

## 2021-02-25 MED ORDER — CAMPHOR-MENTHOL-METHYL SAL 3.1-6-10 % EX PTCH: 1.0000 | MEDICATED_PATCH | Freq: Every day | CUTANEOUS | Status: DC | PRN

## 2021-02-25 MED ORDER — ONDANSETRON HCL 4 MG PO TABS: 4.0000 mg | ORAL_TABLET | Freq: Four times a day (QID) | ORAL | Status: DC | PRN

## 2021-02-25 MED ORDER — MELATONIN 3 MG PO TABS
3.0000 mg | ORAL_TABLET | Freq: Every evening | ORAL | Status: DC | PRN
  Administered 2021-02-25: 3 mg via ORAL
  Filled 2021-02-25: qty 1

## 2021-02-25 MED ORDER — SODIUM CHLORIDE 0.9% FLUSH
3.0000 mL | Freq: Two times a day (BID) | INTRAVENOUS | Status: DC
  Administered 2021-02-25 – 2021-02-28 (×5): 3 mL via INTRAVENOUS

## 2021-02-25 MED ORDER — FERRIC CITRATE 1 GM 210 MG(FE) PO TABS
210.0000 mg | ORAL_TABLET | Freq: Three times a day (TID) | ORAL | Status: DC
  Administered 2021-02-26 – 2021-02-28 (×5): 210 mg via ORAL
  Filled 2021-02-25 (×6): qty 1

## 2021-02-25 MED ORDER — MORPHINE SULFATE (PF) 2 MG/ML IV SOLN: 2.0000 mg | INTRAVENOUS | Status: DC | PRN

## 2021-02-25 MED ORDER — AMIODARONE HCL 200 MG PO TABS
200.0000 mg | ORAL_TABLET | Freq: Every morning | ORAL | Status: DC
  Administered 2021-02-26 – 2021-02-28 (×2): 200 mg via ORAL
  Filled 2021-02-25 (×2): qty 1

## 2021-02-25 MED ORDER — ONDANSETRON HCL 4 MG/2ML IJ SOLN: 4.0000 mg | Freq: Four times a day (QID) | INTRAMUSCULAR | Status: DC | PRN

## 2021-02-25 MED ORDER — ONDANSETRON 4 MG PO TBDP
4.0000 mg | ORAL_TABLET | Freq: Once | ORAL | Status: AC
  Administered 2021-02-25: 4 mg via ORAL
  Filled 2021-02-25: qty 1

## 2021-02-25 MED ORDER — ALBUTEROL SULFATE (2.5 MG/3ML) 0.083% IN NEBU: 2.5000 mg | INHALATION_SOLUTION | Freq: Four times a day (QID) | RESPIRATORY_TRACT | Status: DC | PRN

## 2021-02-25 MED ORDER — GABAPENTIN 100 MG PO CAPS
100.0000 mg | ORAL_CAPSULE | Freq: Every day | ORAL | Status: DC
  Administered 2021-02-25 – 2021-02-27 (×3): 100 mg via ORAL
  Filled 2021-02-25 (×3): qty 1

## 2021-02-25 MED ORDER — POLYVINYL ALCOHOL 1.4 % OP SOLN
1.0000 [drp] | Freq: Every day | OPHTHALMIC | Status: DC | PRN
  Filled 2021-02-25: qty 15

## 2021-02-25 MED ORDER — ATORVASTATIN CALCIUM 40 MG PO TABS
40.0000 mg | ORAL_TABLET | Freq: Every day | ORAL | Status: DC
  Administered 2021-02-26 – 2021-02-27 (×2): 40 mg via ORAL
  Filled 2021-02-25 (×2): qty 1

## 2021-02-25 MED ORDER — FLUTICASONE-UMECLIDIN-VILANT 100-62.5-25 MCG/INH IN AEPB: 1.0000 | INHALATION_SPRAY | Freq: Every day | RESPIRATORY_TRACT | Status: DC

## 2021-02-25 MED ORDER — OXYCODONE-ACETAMINOPHEN 5-325 MG PO TABS
1.0000 | ORAL_TABLET | Freq: Four times a day (QID) | ORAL | Status: DC | PRN
Start: 2021-02-25 — End: 2021-02-28

## 2021-02-25 MED ORDER — MIDODRINE HCL 5 MG PO TABS
10.0000 mg | ORAL_TABLET | Freq: Three times a day (TID) | ORAL | Status: DC
  Administered 2021-02-26 – 2021-02-28 (×4): 10 mg via ORAL
  Filled 2021-02-25 (×4): qty 2

## 2021-02-25 MED ORDER — PANTOPRAZOLE SODIUM 40 MG IV SOLR
40.0000 mg | INTRAVENOUS | Status: DC
  Administered 2021-02-25: 40 mg via INTRAVENOUS
  Filled 2021-02-25: qty 40

## 2021-02-25 MED ORDER — ALLOPURINOL 100 MG PO TABS
100.0000 mg | ORAL_TABLET | Freq: Two times a day (BID) | ORAL | Status: DC
  Administered 2021-02-25 – 2021-02-28 (×4): 100 mg via ORAL
  Filled 2021-02-25 (×6): qty 1

## 2021-02-25 MED ORDER — ACETAMINOPHEN 650 MG RE SUPP: 650.0000 mg | Freq: Four times a day (QID) | RECTAL | Status: DC | PRN

## 2021-02-25 NOTE — ED Provider Notes (Signed)
[facility] EMERGENCY DEPARTMENT Provider Note   CSN: [id number] Arrival date & time: 02/25/21  0849     History Chief Complaint  Patient presents with   GI Bleeding    [name] is a 75 y.o. female.  Patient with history of CAD, A. fib on Eliquis, end-stage renal disease on hemodialysis who presents the ED with black stools and bloody stools over the last week or so.  Has had some nausea and vomiting.  Has had some abdominal pain but none currently.  Had dialysis yesterday and did not feel well since dialysis.  The history is provided by the patient.  GI Problem This is a new problem. The current episode started more than 2 days ago. The problem occurs daily. The problem has not changed since onset.Pertinent negatives include no chest pain, no abdominal pain, no headaches and no shortness of breath. Nothing aggravates the symptoms. Nothing relieves the symptoms. She has tried nothing for the symptoms. The treatment provided no relief.      Past Medical History:  Diagnosis Date   Blood transfusion without reported diagnosis    CAD (coronary artery disease)    Nonobstructive at cardiac catheterization 2000   Cataract    Cervical cancer ([place]) 1978   CHF (congestive heart failure) (HCC)    Chronic back pain    Chronic kidney disease    Class 2 obesity with body mass index (BMI) of 35 to 39.9 without comorbidity    Complication of anesthesia    hard to awaken with one back surgery - years ago, no problem since   COPD (chronic obstructive pulmonary disease) ([place])    Degenerative disc disease    DM (diabetes mellitus), type 2 with renal complications ([place])    Dyslipidemia    Dyspnea    "due to COPD"   Dysrhythmia    a-fib   ESRD (end stage renal disease) on dialysis ([place])    TTHS- [address]   GERD (gastroesophageal reflux disease)    Gout    Hiatal hernia 07/27/2013   History of diverticulitis of colon    History of hiatal hernia    HTN  (hypertension)    09/09/20 not currently on medication, is on medication for hypotension   Hypotension    Iron deficiency anemia    Irritable bowel syndrome    Lumbar radiculopathy    Mixed hyperlipidemia    Moderate major depression, 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 States this was cervical cancer   Oxygen deficiency    room air   PAF (paroxysmal atrial fibrillation) (HCC)    Pneumonia    Sleep apnea    bipap - oxygen 2 l to bipap.   Type 2 diabetes mellitus ([place])    Vitamin B deficiency 12/25/2009   Vitamin B12 deficiency     Patient Active Problem List   Diagnosis Date Noted   Presence of cardiac and vascular implant and graft, unspecified 11/14/2020   Diverticulitis of colon without hemorrhage 09/08/2020   Other disorders of phosphorus metabolism 08/31/2020   Acute respiratory failure due to COVID-19 ([place]) 07/20/2020   Acute respiratory failure with hypoxia ([place]) 07/20/2020   Allergy, unspecified, sequela 06/28/2020   Personal history of anaphylaxis 06/28/2020   Recurrent falls 04/25/2020   Fall 04/24/2020   ESRD (end stage renal disease) on dialysis ([place])  Essential hypertension    Dyslipidemia    DM (diabetes mellitus), type 2 with renal complications (HCC)    Class 2 obesity with body mass index (BMI) of 35 to 39.9 without comorbidity    OSA (obstructive sleep apnea)    COPD (chronic obstructive pulmonary disease) (HCC)    PAF (paroxysmal atrial fibrillation) (HCC)    Acute respiratory distress 03/09/2020   Fluid overload, unspecified 03/09/2020   Acute hyperkalemia 03/06/2020   GERD with esophagitis 03/06/2020   Acute bacterial bronchitis 03/06/2020   Anemia in chronic kidney disease 11/17/2019   Coagulation defect, unspecified ([place]) 11/17/2019   Pain, unspecified 11/17/2019   Pruritus, unspecified 11/17/2019   Secondary hyperparathyroidism of renal  origin ([place]) 11/17/2019   Unspecified protein-calorie malnutrition ([place]) 11/17/2019   Loss of weight 10/22/2019   Goals of care, counseling/discussion 07/23/2019   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 07/21/2019   Paroxysmal atrial fibrillation ([place]) 07/21/2019   Encephalopathy 07/21/2019   Moderate major depression, single episode ([place]) 07/21/2019   Pressure ulcer 07/20/2019   Anorexia 07/10/2019   Weight gain with edema 07/02/2019   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 ([place]) 06/22/2019   Neuropathy due to type 2 diabetes mellitus ([place]) 06/22/2019   Mixed diabetic hyperlipidemia associated with type 2 diabetes mellitus ([place]) 06/22/2019   Chronic gout 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 06/22/2019   Quality of life palliative care encounter 06/19/2019   Atrial fibrillation with RVR (HCC)    Atrial flutter with rapid ventricular response ([place]) 06/16/2019   Dyspnea and respiratory abnormalities 06/01/2019   Acute on chronic respiratory failure with hypoxia and hypercapnia with respiratory acidosis 06/01/2019   Anasarca 05/31/2019   Nausea without vomiting 05/31/2019   Generalized abdominal pain 05/31/2019   History of colonic polyps 05/31/2019   Lymphedema of both lower extremities 04/28/2019   Cellulitis of finger of right hand 08/19/2018   Chronic respiratory failure with hypoxia ([place]) 07/13/2018   Acute renal failure superimposed on stage 3 chronic kidney disease ([place]) 07/13/2018   Chronic diastolic CHF (congestive heart failure) ([place]) 07/13/2018   COPD (chronic obstructive pulmonary disease) ([place]) 07/13/2018   Flatulence 09/18/2017   IBS (irritable bowel syndrome) 04/03/2017   Acute on chronic diastolic CHF (congestive heart failure) ([place]) 05/08/2016   COPD with acute exacerbation ([place]) 05/08/2016   Constipation 04/25/2016   Peripheral vertigo 06/27/2015   Port-A-Cath in place 10/06/2014    Chronic obstructive pulmonary disease ([place]) 04/30/2014   Obesity, Class III, BMI 40-49.9 (morbid obesity) ([place]) 04/30/2014   Hemorrhoids, internal, with bleeding 11/10/2013   Anemia 08/18/2013   Other diseases of tongue 08/18/2013   Chest pain 09/05/2012   Arthropathy 12/09/2011   Edema 12/09/2011   Gout 12/09/2011   Hiatal hernia with GERD and esophagitis    Obstructive chronic bronchitis without exacerbation ([place]) 12/26/2009   Dysphagia 12/26/2009   DM type 2 with diabetic peripheral neuropathy ([place]) 12/25/2009   Vitamin B deficiency 12/25/2009   Iron deficiency anemia due to chronic blood loss 12/25/2009   Benign essential HTN 12/25/2009   DEGENERATIVE [place] DISEASE 12/25/2009   OSA (obstructive sleep apnea) 12/25/2009   HLD (hyperlipidemia) 12/25/2009   Type 2 diabetes mellitus with ESRD (end-stage renal disease) ([place]) 12/25/2009   Narrowing of intervertebral disc space 12/25/2009    Past Surgical History:  Procedure Laterality Date   ABDOMINAL HYSTERECTOMY     APPLICATION OF WOUND VAC Right 01/15/2021   Procedure: APPLICATION OF WOUND  VAC;  Surgeon: Cherre Robins, MD;  Location: [facility];  Service: Vascular;  Laterality: Right;   AV FISTULA PLACEMENT Left 08/05/2019   Procedure: ARTERIOVENOUS (AV) FISTULA CREATION LEFT ARM;  Surgeon: Waynetta Sandy, MD;  Location: [facility];  Service: Vascular;  Laterality: Left;   AV FISTULA PLACEMENT Left 09/11/2020   Procedure: INSERTION OF ARTERIOVENOUS (AV) GORE-TEX GRAFT ARM LEFT;  Surgeon: Cherre Robins, MD;  Location: [facility];  Service: Vascular;  Laterality: Left;   AV FISTULA PLACEMENT Right 11/29/2020   Procedure: Creation of Arteriovenous Fistula Right Upper Arm;  Surgeon: Rosetta Posner, MD;  Location: [facility];  Service: Vascular;  Laterality: Right;   [place] Left 10/05/2019   Procedure: SECOND STAGE LEFT BASCILIC VEIN TRANSPOSITION;  Surgeon: Waynetta Sandy, MD;  Location: [facility];   Service: Vascular;  Laterality: Left;   [place] Right 01/15/2021   Procedure: RIGHT UPPER ARM SECOND STAGE [place];  Surgeon: Cherre Robins, MD;  Location: [facility];  Service: Vascular;  Laterality: Right;  PERIPHERAL NERVE BLOCK   Benign breast cysts     CHOLECYSTECTOMY     COLONOSCOPY  10/01/2006   SLF:Pan colonic diverticulosis and moderate internal hemorrhoids/ Otherwise no polyps, masses, inflammatory changes or AVMs/   COLONOSCOPY  2011   SLF: pancolonic diverticulosis, large internal hemorrhoids   COLONOSCOPY N/A 01/26/2016   Procedure: COLONOSCOPY;  Surgeon: [name], MD;  [facility];  Service: Endoscopy;  Laterality: N/A;  830    COLONOSCOPY WITH PROPOFOL N/A 02/10/2020   Procedure: COLONOSCOPY WITH PROPOFOL;  Surgeon: Daneil Dolin, MD;  [facility];  Service: Endoscopy;  Laterality: N/A;  10:45am   ESOPHAGOGASTRODUODENOSCOPY  11/19/2006   SLF: Large hiatal hernia without evidence of Cameron ulcers/. Distal esophageal stricture, which allowed the gastroscope to pass without resistance.  A 16 mm Savary later passed with mild resistance/ Normal stomach.sb bx negative   ESOPHAGOGASTRODUODENOSCOPY  10/01/2006   GMW:NUUVO hiatal hernia.  Distal esophagus without evidence of   erythema, ulceration or Barrett's esophagus   ESOPHAGOGASTRODUODENOSCOPY  2011   SLF: large hh, distal esophageal web narrowing to 74mm s/p dilation to 88mm   ESOPHAGOGASTRODUODENOSCOPY N/A 08/06/2013   SLF: 1. Stricture at the gastroesophageal junction 2. large hiatal hernia. 3. Mild erosive gastritis.   ESOPHAGOGASTRODUODENOSCOPY (EGD) WITH PROPOFOL N/A 07/19/2019   rourk: Mild erosive reflux esophagitis.  Mild Schatzki ring status post dilation.  Large hiatal hernia with at least one half of the stomach above the diaphragm.  Gastric mucosa erythematous.   GIVENS CAPSULE STUDY N/A 08/06/2013   INCOMPLETE-SMALL BOWLE ULCERS   IR FLUORO GUIDE CV LINE  LEFT  07/29/2019   IR US GUIDE VASC ACCESS LEFT  07/29/2019   KNEE SURGERY Right    PARTIAL HYSTERECTOMY  1978   PORT-A-CATH REMOVAL Right 11/29/2020   Procedure: REMOVAL OF RIGHT CHEST PORT;  Surgeon: Rosetta Posner, MD;  Location: [facility];  Service: Vascular;  Laterality: Right;   small bowel capsule  2008   negative   SPINE SURGERY     TONSILLECTOMY AND ADENOIDECTOMY     Two back surgeries/fusion     UMBILICAL HERNIA REPAIR  2010   UPPER EXTREMITY VENOGRAPHY N/A 11/22/2020   Procedure: UPPER EXTREMITY VENOGRAPHY;  Surgeon: Cherre Robins, MD;  Location: [facility];  Service: Cardiovascular;  Laterality: N/A;     OB History   No  obstetric history on file.     Family History  Problem Relation Age of Onset   Colon cancer Brother        diagnosed age 34. Living.    Ulcers Sister    Diabetes Sister    Heart attack Sister    Kidney failure Sister    Stroke Sister    Ulcers Mother    Diabetes Mother    Heart attack Mother    Stroke Mother    Asthma Mother    Heart disease Mother    Cervical cancer Mother    Heart attack Brother    Heart disease Brother    Asthma Sister    Diabetes Brother    Stroke Maternal Grandmother    Heart attack Maternal Grandmother    Heart attack Other    Early death Father        MVA in his 14s    Social History   Tobacco Use   Smoking status: Former    Packs/day: 1.00    Years: 1.00    Pack years: 1.00    Types: Cigarettes    Start date: 02/19/1961    Quit date: 08/05/1961    Years since quitting: 59.6   Smokeless tobacco: Never   Tobacco comments:    1 year in her lifetime  Vaping Use   Vaping Use: Never used  Substance Use Topics   Alcohol use: Never   Drug use: Never    Home Medications Prior to Admission medications   Medication Sig Start Date End Date Taking? Authorizing Provider  acetaminophen (TYLENOL) 325 MG tablet Take 650 mg by mouth daily as needed for headache (pain).    [provider]  albuterol  (PROVENTIL) (2.5 MG/3ML) 0.083% nebulizer solution Take 2.5 mg by nebulization 4 (four) times daily as needed for wheezing or shortness of breath.  12/22/19   [provider]  allopurinol (ZYLOPRIM) 100 MG tablet Take 1 tablet (100 mg total) by mouth 2 (two) times daily. 08/09/19   Nita Sells, MD  amiodarone (PACERONE) 200 MG tablet Take 1 tablet (200 mg total) by mouth in the morning. 01/15/21   Dagoberto Ligas, PA-C  apixaban (ELIQUIS) 2.5 MG TABS tablet Take 2.5 mg by mouth 2 (two) times daily.    [provider]  aspirin EC 81 MG tablet Take 81 mg by mouth in the morning.    [provider]  atorvastatin (LIPITOR) 40 MG tablet Take 1 tablet (40 mg total) by mouth daily in the afternoon. In the afternoon Patient not taking: Reported on 02/06/2021 01/15/21   Dagoberto Ligas, PA-C  Biotin 5000 MCG TABS Take 5,000 mcg by mouth in the morning.    [provider]  Camphor-Menthol-Methyl Sal (SALONPAS) 3.08-10-08 % PTCH Place 1 patch onto the skin daily as needed (pain).    [provider]  Carboxymethylcellul-Glycerin (CLEAR EYES FOR DRY EYES) 1-0.25 % SOLN Place 1 drop into both eyes daily as needed (dry eys).    [provider]  Cholecalciferol (VITAMIN D3) 50 MCG (2000 UT) TABS Take 2,000 Units by mouth in the morning.    [provider]  doxercalciferol (HECTOROL) 0.5 MCG capsule Take 0.5 mcg by mouth every dialysis. Patient not taking: Reported on 02/06/2021 09/21/20 09/20/21  [provider]  esomeprazole (NEXIUM) 40 MG capsule Take 40 mg by mouth in the morning. 12/06/19   [provider]  ferric citrate (AURYXIA) 1 GM 210 MG(Fe) tablet Take 210 mg by mouth 3 (three)  times daily with meals.    [provider]  Fluticasone-Umeclidin-Vilant (TRELEGY ELLIPTA) 100-62.5-25 MCG/INH AEPB Inhale 1 puff into the lungs daily at 12 noon.    [provider]  gabapentin (NEURONTIN) 100 MG capsule Take 100 mg by  mouth at bedtime. 12/29/19   [provider]  insulin glargine (LANTUS) 100 unit/mL SOPN Inject into the skin daily.    [provider]  insulin lispro (HUMALOG) 100 UNIT/ML injection Inject 5-16 Units into the skin 3 (three) times daily as needed (greater than 120). 11/28/20- "has not take in a while".    [provider]  lidocaine (LIDODERM) 5 % Place 1 patch onto the skin daily as needed (pain.). Remove & Discard patch within 12 hours or as directed by MD 01/15/21   Dagoberto Ligas, PA-C  Methoxy PEG-Epoetin Beta (MIRCERA IJ) Mircera 09/14/20 12/13/21  [provider]  midodrine (PROAMATINE) 10 MG tablet Take 1 tablet (10 mg total) by mouth 3 (three) times daily with meals. 04/28/20   Geradine Girt, DO  naproxen sodium (ALEVE) 220 MG tablet Take 220 mg by mouth daily as needed (pain).    [provider]  nitroGLYCERIN (NITROSTAT) 0.4 MG SL tablet Place 0.4 mg under the tongue every 5 (five) minutes x 3 doses as needed for chest pain. 12/07/19   [provider]  ondansetron (ZOFRAN) 4 MG tablet Take 4 mg by mouth every 8 (eight) hours as needed for nausea/vomiting, nausea or vomiting. 12/29/19   [provider]  oxyCODONE-acetaminophen (PERCOCET) 5-325 MG tablet Take 1 tablet by mouth every 6 (six) hours as needed for severe pain. 01/15/21   Dagoberto Ligas, PA-C  OXYGEN Inhale 2 L into the lungs See admin instructions. Use every night and as needed during the day    [provider]  Phenylephrine-DM-GG-APAP (MUCINEX SINUS-MAX) 5-10-200-325 MG TABS Take 1 tablet by mouth 2 (two) times daily as needed (sinus issues).    [provider]  PRESCRIPTION MEDICATION Inhale into the lungs at bedtime. Bipap    [provider]    Allergies    Patient has no known allergies.  Review of Systems   Review of Systems  Constitutional:  Negative for chills and fever.  HENT:  Negative for ear pain and sore throat.   Eyes:   Negative for pain and visual disturbance.  Respiratory:  Negative for cough and shortness of breath.   Cardiovascular:  Negative for chest pain and palpitations.  Gastrointestinal:  Positive for blood in stool, nausea and vomiting. Negative for abdominal distention, abdominal pain, constipation and diarrhea.  Genitourinary:  Negative for dysuria and hematuria.  Musculoskeletal:  Negative for arthralgias and back pain.  Skin:  Negative for color change and rash.  Neurological:  Negative for seizures, syncope and headaches.  All other systems reviewed and are negative.  Physical Exam Updated Vital Signs BP (!) 124/45   Pulse 79   Temp 98.7 F (37.1 C) (Oral)   Resp 15   LMP  (LMP Unknown)   SpO2 94%   Physical Exam Vitals and nursing note reviewed.  Constitutional:      General: She is not in acute distress.    Appearance: She is well-developed. She is not ill-appearing.  HENT:     Head: Normocephalic and atraumatic.     Mouth/Throat:     Mouth: Mucous membranes are moist.  Eyes:     Extraocular Movements: Extraocular movements intact.     Conjunctiva/sclera: Conjunctivae normal.  Pupils: Pupils are equal, round, and reactive to light.  Cardiovascular:     Rate and Rhythm: Normal rate and regular rhythm.     Pulses: Normal pulses.     Heart sounds: Normal heart sounds. No murmur heard. Pulmonary:     Effort: Pulmonary effort is normal. No respiratory distress.     Breath sounds: Normal breath sounds.  Abdominal:     General: Abdomen is flat.     Palpations: Abdomen is soft.     Tenderness: There is no abdominal tenderness.  Genitourinary:    Rectum: Guaiac result positive.     Comments: Brownish-red stool Musculoskeletal:     Cervical back: Neck supple.  Skin:    General: Skin is warm and dry.     Capillary Refill: Capillary refill takes less than 2 seconds.  Neurological:     General: No focal deficit present.     Mental Status: She is alert.    ED Results /  Procedures / Treatments   Labs (all labs ordered are listed, but only abnormal results are displayed) Labs Reviewed  CBC WITH DIFFERENTIAL/PLATELET - Abnormal; Notable for the following components:      Result Value   RBC 3.48 (*)    Hemoglobin 10.2 (*)    HCT 32.3 (*)    RDW 15.7 (*)    Abs Immature Granulocytes 0.31 (*)    All other components within normal limits  COMPREHENSIVE METABOLIC PANEL - Abnormal; Notable for the following components:   Chloride 97 (*)    Glucose, Bld 109 (*)    Creatinine, Ser 3.58 (*)    Total Protein 6.3 (*)    Albumin 2.4 (*)    AST 13 (*)    GFR, Estimated 13 (*)    All other components within normal limits  POC OCCULT BLOOD, ED - Abnormal; Notable for the following components:   Fecal Occult Bld POSITIVE (*)    All other components within normal limits  RESP PANEL BY RT-PCR (FLU A&B, COVID) ARPGX2  LIPASE, BLOOD  TYPE AND SCREEN    EKG None  Radiology DG Chest 2 View  Result Date: 02/25/2021 CLINICAL DATA:  Cough. EXAM: CHEST - 2 VIEW COMPARISON:  07/19/2020 FINDINGS: The lungs are clear without focal pneumonia, edema, pneumothorax or pleural effusion. The cardiopericardial silhouette is within normal limits for size. Large hiatal hernia again noted. Right Port-A-Cath is been removed in the interval. Left IJ central line remains in place. IMPRESSION: 1. No acute cardiopulmonary findings. 2. Large hiatal hernia. Electronically Signed   By: Misty Stanley M.D.   On: 02/25/2021 09:25    Procedures Procedures   Medications Ordered in ED Medications  ondansetron (ZOFRAN-ODT) disintegrating tablet 4 mg (4 mg Oral Given 02/25/21 0915)    ED Course  I have reviewed the triage vital signs and the nursing notes.  Pertinent labs & imaging results that were available during my care of the patient were reviewed by me and considered in my medical decision making (see chart for details).    MDM Rules/Calculators/A&P                            [name] is here with concern for GI bleed, nausea, vomiting.  Patient with dark stools for the last week or so.  Has schedule a follow-up with her GI doctor but has not felt well since dialysis yesterday.  Has had some nausea and vomiting.  Continues  to have bloody stools.  States that stools earlier in the week were dark black but now has had some more brownish-red stools which is what she appears to have on exam.  Hemoccult was positive.  She is on Eliquis.  She is had some abdominal cramping but she has no real focal abdominal pain on exam currently.  Lab work shows a significant drop in her hemoglobin from last month from 13 to 10.  No significant leukocytosis otherwise.  Electrolytes are unremarkable.  Creatinine at baseline.  Overall concern for GI bleed.  Discussed case with [facility] GI team who will come evaluate the patient.  She has had prior endoscopies done with gastroenterology group at a [facility].  Will admit to medicine for further care.  This chart was dictated using voice recognition software.  Despite best efforts to proofread,  errors can occur which can change the documentation meaning.   Final Clinical Impression(s) / ED Diagnoses Final diagnoses:  Acute GI bleeding    Rx / DC Orders ED Discharge Orders     None        Lennice Sites, DO 02/25/21 1401

## 2021-02-25 NOTE — H&P (Addendum)
History and Physical    [name] XTK:[id number] DOB: 1946/07/05 DOA: 02/25/2021  Referring MD/NP/PA: Lennice Sites, MD PCP: [name], MD  Patient coming from: Home  Chief Complaint: Nausea and vomiting  I have personally briefly reviewed patient's old medical records in [place]   HPI: [name] is a 75 y.o. female with medical history significant of ESRD on HD, HTN, HLD, PAF on Eliquis, GERD with esophagitis, OSA on BiPAP, colon polyps GERD, and prior history of COVID-19 pneumonia where hospital course complicated by upper GI bleed with coffee-ground emesis presents with complaints of nausea and vomiting.  She had gone to hemodialysis yesterday and complained of feeling nauseous during her session which had to be cut short due to low blood pressures.  There after getting home she usually can drink something and feel better, but reports having several episodes of nonbloody emesis.  Upon further evaluation patient reports that over the last several weeks she has had right-sided abdominal pain.  Sometimes pain would be dull/achy and sometimes sharp and stabbing.  In addition to this she reports that she has been having intermittent dark schools and some with what appeared to be fresh blood present.  She had tried to move up her appointment with gastroenterology but had had no blood.  Her last bowel movement was yesterday morning and that is also the last time she took Eliquis.  Patient has had recent EGD and [place] 2020  ED Course: Upon admission into the emergency department patient was seen to be afebrile, respiration 15-25, blood pressures 108/56-151/64, and O2 saturations maintained.  Labs are significant for hemoglobin 10.9, potassium 3.8, BUN 11, creatinine 3.58.  Influenza and COVID-19 screening was negative.  Stool guaiacs were noted to be positive.  Patient was typed and screened for possible need of blood products and gastroenterology has been formally  consulted.  TRH called to admit.  Review of Systems  Constitutional:  Positive for malaise/fatigue. Negative for fever.  HENT:  Negative for congestion.   Eyes:  Negative for pain.  Respiratory:  Positive for cough. Negative for shortness of breath.   Cardiovascular:  Negative for chest pain and leg swelling.  Gastrointestinal:  Positive for abdominal pain, blood in stool, heartburn, nausea and vomiting.  Genitourinary:  Negative for hematuria.  Musculoskeletal:  Negative for falls.  Neurological:  Positive for weakness. Negative for loss of consciousness.  Endo/Heme/Allergies:  Bruises/bleeds easily.  Psychiatric/Behavioral:  Negative for memory loss and substance abuse.   Otherwise a complete 10 point review of systems was performed and negative except for as noted above in HPI   Past Medical History:  Diagnosis Date   Blood transfusion without reported diagnosis    CAD (coronary artery disease)    Nonobstructive at cardiac catheterization 2000   Cataract    Cervical cancer ([place]) 1978   CHF (congestive heart failure) (HCC)    Chronic back pain    Chronic kidney disease    Class 2 obesity with body mass index (BMI) of 35 to 39.9 without comorbidity    Complication of anesthesia    hard to awaken with one back surgery - years ago, no problem since   COPD (chronic obstructive pulmonary disease) ([place])    Degenerative disc disease    DM (diabetes mellitus), type 2 with renal complications (HCC)    Dyslipidemia    Dyspnea    "due to COPD"   Dysrhythmia    a-fib   ESRD (end stage renal disease)  on dialysis 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 States this was cervical cancer   Oxygen deficiency    room air   PAF (paroxysmal atrial fibrillation) (HCC)    Pneumonia    Sleep apnea    bipap - oxygen 2 l to bipap.   Type 2 diabetes mellitus ([place])    Vitamin B deficiency 12/25/2009   Vitamin B12 deficiency     Past Surgical History:  Procedure Laterality Date   ABDOMINAL HYSTERECTOMY     APPLICATION OF WOUND VAC Right 01/15/2021   Procedure: APPLICATION OF WOUND VAC;  Surgeon: Cherre Robins, MD;  Location: [place];  Service: Vascular;  Laterality: Right;   AV FISTULA PLACEMENT Left 08/05/2019   Procedure: ARTERIOVENOUS (AV) FISTULA CREATION LEFT ARM;  Surgeon: Waynetta Sandy, MD;  Location: [place];  Service: Vascular;  Laterality: Left;   AV FISTULA PLACEMENT Left 09/11/2020   Procedure: INSERTION OF ARTERIOVENOUS (AV) GORE-TEX GRAFT ARM LEFT;  Surgeon: Cherre Robins, MD;  Location: [place];  Service: Vascular;  Laterality: Left;   AV FISTULA PLACEMENT Right 11/29/2020   Procedure: Creation of Arteriovenous Fistula Right Upper Arm;  Surgeon: Rosetta Posner, MD;  Location: [place];  Service: Vascular;  Laterality: Right;   [place] Left 10/05/2019   Procedure: SECOND STAGE LEFT BASCILIC VEIN TRANSPOSITION;  Surgeon: Waynetta Sandy, MD;  Location: [place];  Service: Vascular;  Laterality: Left;   [place] Right 01/15/2021   Procedure: RIGHT UPPER ARM SECOND STAGE [place];  Surgeon: Cherre Robins, MD;  [facility];  Service: Vascular;  Laterality: Right;  PERIPHERAL NERVE BLOCK   Benign breast cysts     CHOLECYSTECTOMY     COLONOSCOPY  10/01/2006   SLF:Pan colonic diverticulosis and moderate internal hemorrhoids/ Otherwise no polyps, masses, inflammatory  changes or AVMs/   COLONOSCOPY  2011   SLF: pancolonic diverticulosis, large internal hemorrhoids   COLONOSCOPY N/A 01/26/2016   Procedure: COLONOSCOPY;  Surgeon: [name], MD;  [facility];  Service: Endoscopy;  Laterality: N/A;  830    COLONOSCOPY WITH PROPOFOL N/A 02/10/2020   Procedure: COLONOSCOPY WITH PROPOFOL;  Surgeon: Daneil Dolin, MD;  [facility];  Service: Endoscopy;  Laterality: N/A;  10:45am   ESOPHAGOGASTRODUODENOSCOPY  11/19/2006   SLF: Large hiatal hernia without evidence of Cameron ulcers/. Distal esophageal stricture, which allowed the gastroscope to pass without resistance.  A 16 mm Savary later passed with mild resistance/ Normal stomach.sb bx negative   ESOPHAGOGASTRODUODENOSCOPY  10/01/2006   ZOX:WRUEA hiatal hernia.  Distal esophagus without evidence of   erythema, ulceration or Barrett's esophagus   ESOPHAGOGASTRODUODENOSCOPY  2011   SLF: large hh, distal esophageal web narrowing to 4mm s/p dilation to 37mm   ESOPHAGOGASTRODUODENOSCOPY N/A 08/06/2013  SLF: 1. Stricture at the gastroesophageal junction 2. large hiatal hernia. 3. Mild erosive gastritis.   ESOPHAGOGASTRODUODENOSCOPY (EGD) WITH PROPOFOL N/A 07/19/2019   rourk: Mild erosive reflux esophagitis.  Mild Schatzki ring status post dilation.  Large hiatal hernia with at least one half of the stomach above the diaphragm.  Gastric mucosa erythematous.   GIVENS CAPSULE STUDY N/A 08/06/2013   INCOMPLETE-SMALL BOWLE ULCERS   IR FLUORO GUIDE CV LINE LEFT  07/29/2019   IR US GUIDE VASC ACCESS LEFT  07/29/2019   KNEE SURGERY Right    PARTIAL HYSTERECTOMY  1978   PORT-A-CATH REMOVAL Right 11/29/2020   Procedure: REMOVAL OF RIGHT CHEST PORT;  Surgeon: Rosetta Posner, MD;  Location: [place];  Service: Vascular;  Laterality: Right;   small bowel capsule  2008   negative   SPINE SURGERY     TONSILLECTOMY AND ADENOIDECTOMY     Two back surgeries/fusion     UMBILICAL HERNIA REPAIR  2010   UPPER  EXTREMITY VENOGRAPHY N/A 11/22/2020   Procedure: UPPER EXTREMITY VENOGRAPHY;  Surgeon: Cherre Robins, MD;  Location: [place] [facility];  Service: Cardiovascular;  Laterality: N/A;     reports that she quit smoking about 59 years ago. Her smoking use included cigarettes. She started smoking about 60 years ago. She has a 1.00 pack-year smoking history. She has never used smokeless tobacco. She reports that she does not drink alcohol and does not use drugs.  No Known Allergies  Family History  Problem Relation Age of Onset   Colon cancer Brother        diagnosed age 69. Living.    Ulcers Sister    Diabetes Sister    Heart attack Sister    Kidney failure Sister    Stroke Sister    Ulcers Mother    Diabetes Mother    Heart attack Mother    Stroke Mother    Asthma Mother    Heart disease Mother    Cervical cancer Mother    Heart attack Brother    Heart disease Brother    Asthma Sister    Diabetes Brother    Stroke Maternal Grandmother    Heart attack Maternal Grandmother    Heart attack Other    Early death Father        MVA in his 30s    Prior to Admission medications   Medication Sig Start Date End Date Taking? Authorizing Provider  acetaminophen (TYLENOL) 325 MG tablet Take 650 mg by mouth daily as needed for headache (pain).    [provider]  albuterol (PROVENTIL) (2.5 MG/3ML) 0.083% nebulizer solution Take 2.5 mg by nebulization 4 (four) times daily as needed for wheezing or shortness of breath.  12/22/19   [provider]  allopurinol (ZYLOPRIM) 100 MG tablet Take 1 tablet (100 mg total) by mouth 2 (two) times daily. 08/09/19   Nita Sells, MD  amiodarone (PACERONE) 200 MG tablet Take 1 tablet (200 mg total) by mouth in the morning. 01/15/21   Dagoberto Ligas, PA-C  apixaban (ELIQUIS) 2.5 MG TABS tablet Take 2.5 mg by mouth 2 (two) times daily.    [provider]  aspirin EC 81 MG tablet Take 81 mg by mouth in the morning.    [provider]  atorvastatin (LIPITOR) 40 MG tablet Take 1 tablet (40 mg total) by mouth daily in the afternoon. In the afternoon Patient not taking: Reported on 02/06/2021 01/15/21   Dagoberto Ligas, PA-C  Biotin  5000 MCG TABS Take 5,000 mcg by mouth in the morning.    [provider]  Camphor-Menthol-Methyl Sal (SALONPAS) 3.08-10-08 % PTCH Place 1 patch onto the skin daily as needed (pain).    [provider]  Carboxymethylcellul-Glycerin (CLEAR EYES FOR DRY EYES) 1-0.25 % SOLN Place 1 drop into both eyes daily as needed (dry eys).    [provider]  Cholecalciferol (VITAMIN D3) 50 MCG (2000 UT) TABS Take 2,000 Units by mouth in the morning.    [provider]  doxercalciferol (HECTOROL) 0.5 MCG capsule Take 0.5 mcg by mouth every dialysis. Patient not taking: Reported on 02/06/2021 09/21/20 09/20/21  [provider]  esomeprazole (NEXIUM) 40 MG capsule Take 40 mg by mouth in the morning. 12/06/19   [provider]  ferric citrate (AURYXIA) 1 GM 210 MG(Fe) tablet Take 210 mg by mouth 3 (three) times daily with meals.    [provider]  Fluticasone-Umeclidin-Vilant (TRELEGY ELLIPTA) 100-62.5-25 MCG/INH AEPB Inhale 1 puff into the lungs daily at 12 noon.    [provider]  gabapentin (NEURONTIN) 100 MG capsule Take 100 mg by mouth at bedtime. 12/29/19   [provider]  insulin glargine (LANTUS) 100 unit/mL SOPN Inject into the skin daily.    [provider]  insulin lispro (HUMALOG) 100 UNIT/ML injection Inject 5-16 Units into the skin 3 (three) times daily as needed (greater than 120). 11/28/20- "has not take in a while".    [provider]  lidocaine (LIDODERM) 5 % Place 1 patch onto the skin daily as needed (pain.). Remove & Discard patch within 12 hours or as directed by MD 01/15/21   Dagoberto Ligas, PA-C  Methoxy PEG-Epoetin Beta (MIRCERA IJ) Mircera 09/14/20 12/13/21  [provider]  midodrine  (PROAMATINE) 10 MG tablet Take 1 tablet (10 mg total) by mouth 3 (three) times daily with meals. 04/28/20   Geradine Girt, DO  naproxen sodium (ALEVE) 220 MG tablet Take 220 mg by mouth daily as needed (pain).    [provider]  nitroGLYCERIN (NITROSTAT) 0.4 MG SL tablet Place 0.4 mg under the tongue every 5 (five) minutes x 3 doses as needed for chest pain. 12/07/19   [provider]  ondansetron (ZOFRAN) 4 MG tablet Take 4 mg by mouth every 8 (eight) hours as needed for nausea/vomiting, nausea or vomiting. 12/29/19   [provider]  oxyCODONE-acetaminophen (PERCOCET) 5-325 MG tablet Take 1 tablet by mouth every 6 (six) hours as needed for severe pain. 01/15/21   Dagoberto Ligas, PA-C  OXYGEN Inhale 2 L into the lungs See admin instructions. Use every night and as needed during the day    [provider]  Phenylephrine-DM-GG-APAP (MUCINEX SINUS-MAX) 5-10-200-325 MG TABS Take 1 tablet by mouth 2 (two) times daily as needed (sinus issues).    [provider]  PRESCRIPTION MEDICATION Inhale into the lungs at bedtime. Bipap    [provider]    Physical Exam:  Constitutional: Elderly female who appears to be in no acute distress at this time Vitals:   02/25/21 1315 02/25/21 1330 02/25/21 1400 02/25/21 1430  BP: (!) 151/64 (!) 124/45 (!) 108/56 (!) 123/45  Pulse: 82 79 83 84  Resp: 20 15 (!) 21 (!) 23  Temp:      TempSrc:      SpO2: 95% 94% 94% 99%   Eyes: PERRL, lids and conjunctivae normal ENMT: Mucous membranes are moist. Posterior pharynx clear of any exudate or lesions.  Neck: normal, supple, no masses, no thyromegaly Respiratory: Mildly tachypneic with Cardiovascular: Regular rate and rhythm, no murmurs / rubs / gallops. No extremity edema. 2+ pedal pulses. No carotid bruits.  Right upper extremity fistula in place Abdomen: Tenderness palpation noted of the right upper quadrant and to a lesser degree of the right lower quadrant.   Bowel sounds present. Musculoskeletal: no clubbing / cyanosis. No joint deformity upper and lower extremities. Good ROM, no contractures. Normal muscle tone.  Skin: Areas of bruising noted of the bilateral upper extremity Neurologic: CN 2-12 grossly intact. Sensation intact, DTR normal. Strength 5/5 in all 4.  Psychiatric: Normal judgment and insight. Alert and oriented x 3. Normal mood.     Labs on Admission: I have personally reviewed following labs and imaging studies  CBC: Recent Labs  Lab 02/25/21 0914  WBC 8.7  NEUTROABS 6.4  HGB 10.2*  HCT 32.3*  MCV 92.8  PLT 735   Basic Metabolic Panel: Recent Labs  Lab 02/25/21 0914  NA 137  K 3.8  CL 97*  CO2 28  GLUCOSE 109*  BUN 11  CREATININE 3.58*  CALCIUM 9.2   GFR: CrCl cannot be calculated (Unknown ideal weight.). Liver Function Tests: Recent Labs  Lab 02/25/21 0914  AST 13*  ALT 9  ALKPHOS 85  BILITOT 0.6  PROT 6.3*  ALBUMIN 2.4*   Recent Labs  Lab 02/25/21 0914  LIPASE 35   No results for input(s): AMMONIA in the last 168 hours. Coagulation Profile: No results for input(s): INR, PROTIME in the last 168 hours. Cardiac Enzymes: No results for input(s): CKTOTAL, CKMB, CKMBINDEX, TROPONINI in the last 168 hours. BNP (last 3 results) No results for input(s): PROBNP in the last 8760 hours. HbA1C: No results for input(s): HGBA1C in the last 72 hours. CBG: No results for input(s): GLUCAP in the last 168 hours. Lipid Profile: No results for input(s): CHOL, HDL, LDLCALC, TRIG, CHOLHDL, LDLDIRECT in the last 72 hours. Thyroid Function Tests: No results for input(s): TSH, T4TOTAL, FREET4, T3FREE, THYROIDAB in the last 72 hours. Anemia Panel: No results for input(s): VITAMINB12, FOLATE, FERRITIN, TIBC, IRON, RETICCTPCT in the last 72 hours. Urine analysis:    Component Value Date/Time   COLORURINE YELLOW 04/24/2020 1528   APPEARANCEUR HAZY (A) 04/24/2020 1528   LABSPEC 1.013 04/24/2020 1528   PHURINE  7.0 04/24/2020 1528   GLUCOSEU NEGATIVE 04/24/2020 1528   HGBUR NEGATIVE 04/24/2020 1528   BILIRUBINUR NEGATIVE 04/24/2020 1528   KETONESUR NEGATIVE 04/24/2020 1528   PROTEINUR 100 (A) 04/24/2020 1528   UROBILINOGEN 0.2 01/29/2007 1500   NITRITE NEGATIVE 04/24/2020 1528   LEUKOCYTESUR NEGATIVE 04/24/2020 1528   Sepsis Labs: Recent Results (from the past 240 hour(s))  Resp Panel by RT-PCR (Flu A&B, Covid) Nasopharyngeal Swab     Status: None   Collection Time: 02/25/21 12:25 PM   Specimen: Nasopharyngeal Swab; Nasopharyngeal(NP) swabs in vial transport medium  Result Value Ref Range Status   SARS Coronavirus 2 by RT PCR NEGATIVE NEGATIVE Final    Comment: (NOTE) SARS-CoV-2 target nucleic acids are NOT DETECTED.  The SARS-CoV-2 RNA is generally detectable in upper respiratory specimens during the acute phase of infection. The lowest concentration of SARS-CoV-2 viral copies this assay can detect is 138 copies/mL. A negative result does not preclude SARS-Cov-2 infection and should not be used as the sole basis for treatment or other patient management decisions. A negative result may occur with  improper specimen collection/handling, submission of specimen other than nasopharyngeal swab,  presence of viral mutation(s) within the areas targeted by this assay, and inadequate number of viral copies(<138 copies/mL). A negative result must be combined with clinical observations, patient history, and epidemiological information. The expected result is Negative.  Fact Sheet for Patients:  EntrepreneurPulse.com.au  Fact Sheet for Healthcare Providers:  IncredibleEmployment.be  This test is no t yet approved or cleared by the [place] FDA and  has been authorized for detection and/or diagnosis of SARS-CoV-2 by FDA under an Emergency Use Authorization (EUA). This EUA will remain  in effect (meaning this test can be used) for the duration of  the COVID-19 declaration under Section 564(b)(1) of the Act, 21 U.S.C.section 360bbb-3(b)(1), unless the authorization is terminated  or revoked sooner.       Influenza A by PCR NEGATIVE NEGATIVE Final   Influenza B by PCR NEGATIVE NEGATIVE Final    Comment: (NOTE) The Xpert Xpress SARS-CoV-2/FLU/RSV plus assay is intended as an aid in the diagnosis of influenza from Nasopharyngeal swab specimens and should not be used as a sole basis for treatment. Nasal washings and aspirates are unacceptable for Xpert Xpress SARS-CoV-2/FLU/RSV testing.  Fact Sheet for Patients: EntrepreneurPulse.com.au  Fact Sheet for Healthcare Providers: IncredibleEmployment.be  This test is not yet approved or cleared by the [place] FDA and has been authorized for detection and/or diagnosis of SARS-CoV-2 by FDA under an Emergency Use Authorization (EUA). This EUA will remain in effect (meaning this test can be used) for the duration of the COVID-19 declaration under Section 564(b)(1) of the Act, 21 U.S.C. section 360bbb-3(b)(1), unless the authorization is terminated or revoked.  Performed at [facility], [place] [address] [place], [place] [address]      Radiological Exams on Admission: DG Chest 2 View  Result Date: 02/25/2021 CLINICAL DATA:  Cough. EXAM: CHEST - 2 VIEW COMPARISON:  07/19/2020 FINDINGS: The lungs are clear without focal pneumonia, edema, pneumothorax or pleural effusion. The cardiopericardial silhouette is within normal limits for size. Large hiatal hernia again noted. Right Port-A-Cath is been removed in the interval. Left IJ central line remains in place. IMPRESSION: 1. No acute cardiopulmonary findings. 2. Large hiatal hernia. Electronically Signed   By: Misty Stanley M.D.   On: 02/25/2021 09:25    EKG: Independently reviewed.  Sinus rhythm at 77 bpm with premature atrial complexes  Assessment/Plan Acute blood loss anemia secondary to  GI bleed: Patient presents with complaints of right-sided abdominal pain and bloody stools.  Stool guaiacs were noted to be positive.  Hemoglobin noted to be 10.2 g/dL on admission but previously had been 13.3 g/dL on 01/15/2021.  Patient with a previous history of colon polyps. Colonoscopy from 02/2020 noted diverticulosis of the entire colon.  EGD from 07/2020 showed mild erosive gastropathy, large hiatal hernia, and Schatzki ring.  GI had been formally consulted and plan on taking patient for EGD in a.m. -Admit to a medical telemetry -Clear liquid diet and n.p.o. after midnight -Hold Eliquis and aspirin due to bleed -Continue Protonix daily -Serial monitoring of H&H Q 6 hours x 4 -Appreciate GI consultative services,  will follow for further recommendations  ESRD on HD: Patient normally dialyzes Tuesday, Thursday, Saturday.  She reportedly had a reduced session yesterday due to low blood pressures for which her son made note is a common occurrence. -Check renal function panel in a.m. -Consider nephrology in a.m.  Paroxysmal atrial fibrillation on chronic anticoagulation: Patient currently appears to be relatively rate controlled in sinus rhythm with intermittent PACs. -Hold  Eliquis -Continue amiodarone  Chronic respiratory failure COPD: Patient has 2 L of nasal cannula oxygen needs as needed.  O2 saturations currently maintained on room air. -Continue nasal cannula oxygen as needed -Continue home maintenance inhaler and albuterol nebs as needed for shortness of breath/wheezing  Diabetes mellitus type 2 with peripheral neuropathy: Blood sugars have been well controlled less than 150.  Last hemoglobin A1c was 6 in 04/2020.  Seen she has been diet controlled. -Continue gabapentin -Continue to monitor blood sugars as needed  Hypotension: Blood pressures currently stable, but son notes this is a chronic issue especially during dialysis. -Continue midodrine  Large hiatal hernia GERD with  esophagitis: As noted per previous EGD in 07/2020. Patient's son does report that patient has a chronic cough suspect secondary with history of acid reflux. -Continue PPI  Hyperlipidemia: Patient ran out of atorvastatin several weeks ago -Needs refill on her atorvastatin  OSA -BiPAP/oxygen nightly  DVT prophylaxis: SCDs Code Status: Full Family Communication: Son updated over the phone Disposition Plan: Home Consults called: [place] [facility] Admission status: Observation  Norval Morton MD [facility]   If 7PM-7AM, please contact night-coverage   02/25/2021, 3:12 PM

## 2021-02-25 NOTE — ED Provider Notes (Signed)
Emergency Medicine Provider Triage Evaluation Note  LELIA JONS , a 75 y.o. female  was evaluated in triage.  Pt complains of nausea, vomiting, Donnell pain, rectal bleeding, generalized weakness.  Patient states her symptoms began yesterday mid dialysis session.  She normally goes Tuesday, Thursday, Saturday.  They were unable to complete her dialysis yesterday.  She reports dark black stools as well as bright red blood coming per rectum.  She takes Eliquis.  She feels weaker than normal.  Review of Systems  Positive: N/v, abd pain, rectal bleeding, weakness Negative: fever  Physical Exam  LMP  (LMP Unknown)  Gen:   Awake, pt appears uncomfortable Resp:  Normal effort.  Dry cough noted during exam.  No rales. MSK:   Moves extremities without difficulty  Other:  Ttp of upper abd. No rigidity or distention  Medical Decision Making  Medically screening exam initiated at 8:51 AM.  Appropriate orders placed.  VALORIA TAMBURRI was informed that the remainder of the evaluation will be completed by another provider, this initial triage assessment does not replace that evaluation, and the importance of remaining in the ED until their evaluation is complete.  Labs, ua, ekg ordered   Franchot Heidelberg, PA-C 02/25/21 0911    Lennice Sites, DO 02/25/21 1233

## 2021-02-25 NOTE — H&P (View-Only) (Signed)
Referring Provider: Dr. Lennice Sites  Primary Care Physician:  [name], MD Primary Gastroenterologist:  Dr.Rourk   Reason for Consultation: Anemia, positive FOBT  HPI: [name] is a 75 y.o. female with a past medical history of CAD, atrial fibrillation on Eliquis, diabetes mellitus type 2, ESRD on hemodialysis Tu/Th/Sat, anemia, COPD, OSA, COVID-pneumonia 07/20/2019 which required hospital admission and treatment with Remdesivir and Solu-Medrol, UGI bleed with coffee ground emesis occurred during her 07/2019 hospital admission, GERD, large hiatal hernia, Schatzki's ring and tubular adenomatous colorectal polyps.    She reports feeling fatigued and generally unwell for the past 2 weeks.  Approximately 7 to 10 days ago she noticed her solid stools were black in color.  She also had 1 or 2 days of diarrhea over the past 2 weeks.  She has been experiencing RUQ pain for the past 3 days.  She developed nausea yesterday while on dialysis and her dialysis session was discontinued early because she was hypotensive.  She remained at the dialysis center for monitoring then was stable so she was discharged home.  Later that evening, she vomited up clear white emesis x 2 or 3 episodes. No coffee-ground or frank hematemesis.  This morning she continued to feel unwell with nausea and dry heaves so she presented to the ED for further evaluation.  She also noted having a cough for the past week or so, clear to white sputum.  No hemoptysis.    Labs in the ED showed a hemoglobin level of 10.2 (baseline Hg 12.6 - 13.3). Hematocrit 32.3.  Platelet 352.  Sodium 137.  Potassium 3.8.  BUN 11.  Creatinine 3.58.  Alk phos 85.  Lipase 35.  AST 13.  ALT 9.  Total bili 0.6.  SARS coronavirus 2 negative.  Chest x-ray showed a large hiatal hernia, no evidence of pneumonia or pulmonary effusion.  She is fatigued.  No current nausea, dry heaves or vomiting.  She has mild RUQ pain.  She reports having  intermittent heartburn with dysphagia which occurs once or twice weekly or daily for the past few months.  She describes having food which gets stuck to her upper esophagus, she typically gags and stuck food is expelled.  She is on Nexium 40 mg every morning and Famotidine nightly.  She is followed by Dr. Gala Romney at the [facility].  Her most recent EGD was 07/19/2019 which identified a large hiatal hernia, erosive reflux esophagitis and a Schatzki's ring was dilated.  Her most recent colonoscopy was 01/26/2016 which identified 3 tubular adenomatous polyps which were removed from the colon.  Brother with history of colon cancer.  No family history of esophageal or gastric cancer.  History of atrial fibrillation, her last dose of Eliquis was taken at 8 AM on Saturday 7/23.  History of CAD on ASA 81 mg once daily.  No other NSAID use.  She is on ferric citrate 3 times daily.  Past GI procedures:  Colonoscopy 02/10/2020 by Dr. Gala Romney: - Diverticulosis in the entire examined colon. Redundant colon. - The examination was otherwise normal on direct and retroflexion views. - No specimens collected.  Colonoscopy 01/26/2016: LOOSE/WATERY STOOLS MOST LIKELY DUE TO IB-C WITH OVERFLOW INCONTINENCE - PANCOLONIC Diverticulosis . - THREE COLORECTAL POLYPS REMOVED - MODERATELY Redundant colon. - Non-bleeding external and internal hemorrhoids. 1. Colon, biopsy, random - BENIGN COLORECTAL MUCOSA. - ASSOCIATED BENIGN LYMPHOID AGGREGATES. - NO EVIDENCE OF SIGNIFICANT INFLAMMATION, DYSPLASIA, OR MALIGNANCY. 2. Colon, polyp(s), right - TUBULAR ADENOMA,  TWO FRAGMENTS. - NO HIGH GRADE DYSPLASIA OR MALIGNANCY IDENTIFIED. 3. Colon, polyp(s), descending - TUBULAR ADENOMA (X1). - NO HIGH GRADE DYSPLASIA OR MALIGNANCY IDENTIFIED  EGD 07/19/2019 secondary to UGI bleed/coffee ground emesis -Mild erosive reflux esophagitis. - Mild Schatzki ring. -Dilated. Large hiatal hernia. Erythematous gastric mucosa. - Normal  duodenal bulb and second - Normal duodenal bulb and second portion of the duodenum. - No specimens collected.  EGD 08/06/2013: Stricture at the GE junction Hiatal hernia Nonerosive gastritis  History of incomplete study of the small bowel in 2015 (capsule never reached the cecum) with evidence of small bowel ulcers.  Echo 07/27/2019: 1. Left ventricular ejection fraction, by visual estimation, is 55 to 60%. The left ventricle has normal function. There is mildly increased left ventricular hypertrophy. 2. Left ventricular diastolic function could not be evaluated. 3. The left ventricle has no regional wall motion abnormalities. 4. Global right ventricle has normal systolic function.The right ventricular size is normal. No increase in right ventricular wall thickness. 5. Left atrial size was moderately dilated. 6. Right atrial size was mildly dilated. 7. Mild mitral annular calcification. 8. The mitral valve is degenerative. Trivial mitral valve regurgitation. 9. The tricuspid valve is grossly normal. Tricuspid valve regurgitation moderate. 10. The tricuspid valve was normal in structure. Tricuspid valve regurgitation moderate. 11. The aortic valve is grossly normal. Aortic valve regurgitation is not visualized. 12. Moderately elevated pulmonary artery systolic pressure. 13. The tricuspid regurgitant velocity is 3.12 m/s, and with an assumed right atrial pressure of 15 mmHg, the estimated right ventricular systolic pressure is moderately elevated at 54.0 mmHg. 14. The inferior vena cava is dilated in size with <50% respiratory variability, suggesting right atrial pressure of 15 mmHg.  Past Medical History:  Diagnosis Date   Blood transfusion without reported diagnosis    CAD (coronary artery disease)    Nonobstructive at cardiac catheterization 2000   Cataract    Cervical cancer ([place]) 1978   CHF (congestive heart failure) (HCC)    Chronic back pain    Chronic kidney disease     Class 2 obesity with body mass index (BMI) of 35 to 39.9 without comorbidity    Complication of anesthesia    hard to awaken with one back surgery - years ago, no problem since   COPD (chronic obstructive pulmonary disease) ([place])    Degenerative disc disease    DM (diabetes mellitus), type 2 with renal complications ([place])    Dyslipidemia    Dyspnea    "due to COPD"   Dysrhythmia    a-fib   ESRD (end stage renal disease) on dialysis ([place])    TTHS- [address]   GERD (gastroesophageal reflux disease)    Gout    Hiatal hernia 07/27/2013   History of diverticulitis of colon    History of hiatal hernia    HTN (hypertension)    09/09/20 not currently on medication, is on medication for hypotension   Hypotension    Iron deficiency anemia    Irritable bowel syndrome    Lumbar radiculopathy    Mixed hyperlipidemia    Moderate major depression, 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 States this was cervical cancer   Oxygen deficiency    room air   PAF (paroxysmal atrial fibrillation) (HCC)    Pneumonia    Sleep apnea    bipap - oxygen  2 l to bipap.   Type 2 diabetes mellitus ([place])    Vitamin B deficiency 12/25/2009   Vitamin B12 deficiency     Past Surgical History:  Procedure Laterality Date   ABDOMINAL HYSTERECTOMY     APPLICATION OF WOUND VAC Right 01/15/2021   Procedure: APPLICATION OF WOUND VAC;  Surgeon: Cherre Robins, MD;  Location: [place];  Service: Vascular;  Laterality: Right;   AV FISTULA PLACEMENT Left 08/05/2019   Procedure: ARTERIOVENOUS (AV) FISTULA CREATION LEFT ARM;  Surgeon: Waynetta Sandy, MD;  Location: [place];  Service: Vascular;  Laterality: Left;   AV FISTULA PLACEMENT Left 09/11/2020   Procedure: INSERTION OF ARTERIOVENOUS (AV) GORE-TEX GRAFT ARM LEFT;  Surgeon: Cherre Robins, MD;  Location: [place];  Service: Vascular;  Laterality: Left;    AV FISTULA PLACEMENT Right 11/29/2020   Procedure: Creation of Arteriovenous Fistula Right Upper Arm;  Surgeon: Rosetta Posner, MD;  Location: [place];  Service: Vascular;  Laterality: Right;   [place] Left 10/05/2019   Procedure: SECOND STAGE LEFT BASCILIC VEIN TRANSPOSITION;  Surgeon: Waynetta Sandy, MD;  Location: [place];  Service: Vascular;  Laterality: Left;   [place] Right 01/15/2021   Procedure: RIGHT UPPER ARM SECOND STAGE [place];  Surgeon: Cherre Robins, MD;  Location: [facility];  Service: Vascular;  Laterality: Right;  PERIPHERAL NERVE BLOCK   Benign breast cysts     CHOLECYSTECTOMY     COLONOSCOPY  10/01/2006   SLF:Pan colonic diverticulosis and moderate internal hemorrhoids/ Otherwise no polyps, masses, inflammatory changes or AVMs/   COLONOSCOPY  2011   SLF: pancolonic diverticulosis, large internal hemorrhoids   COLONOSCOPY N/A 01/26/2016   Procedure: COLONOSCOPY;  Surgeon: [name], MD;  [facility];  Service: Endoscopy;  Laterality: N/A;  830    COLONOSCOPY WITH PROPOFOL N/A 02/10/2020   Procedure: COLONOSCOPY WITH PROPOFOL;  Surgeon: Daneil Dolin, MD;  [facility];  Service: Endoscopy;  Laterality: N/A;  10:45am   ESOPHAGOGASTRODUODENOSCOPY  11/19/2006   SLF: Large hiatal hernia without evidence of Cameron ulcers/. Distal esophageal stricture, which allowed the gastroscope to pass without resistance.  A 16 mm [place] later passed with mild resistance/ Normal stomach.sb bx negative   ESOPHAGOGASTRODUODENOSCOPY  10/01/2006   YME:BRAXE hiatal hernia.  Distal esophagus without evidence of   erythema, ulceration or Barrett's esophagus   ESOPHAGOGASTRODUODENOSCOPY  2011   SLF: large hh, distal esophageal web narrowing to 41m s/p dilation to 172m  ESOPHAGOGASTRODUODENOSCOPY N/A 08/06/2013   SLF: 1. Stricture at the gastroesophageal junction 2. large hiatal hernia. 3. Mild  erosive gastritis.   ESOPHAGOGASTRODUODENOSCOPY (EGD) WITH PROPOFOL N/A 07/19/2019   rourk: Mild erosive reflux esophagitis.  Mild Schatzki ring status post dilation.  Large hiatal hernia with at least one half of the stomach above the diaphragm.  Gastric mucosa erythematous.   GIVENS CAPSULE STUDY N/A 08/06/2013   INCOMPLETE-SMALL BOWLE ULCERS   IR FLUORO GUIDE CV LINE LEFT  07/29/2019   IR [place] VASC ACCESS LEFT  07/29/2019   KNEE SURGERY Right    PARTIAL HYSTERECTOMY  1978   PORT-A-CATH REMOVAL Right 11/29/2020   Procedure: REMOVAL OF RIGHT CHEST PORT;  Surgeon: EaRosetta PosnerMD;  [facility] Service: [facility];  Laterality: Right;   small bowel capsule  2008   negative   SPINE SURGERY     TONSILLECTOMY AND ADENOIDECTOMY  Two back surgeries/fusion     UMBILICAL HERNIA REPAIR  2010   UPPER EXTREMITY VENOGRAPHY N/A 11/22/2020   Procedure: UPPER EXTREMITY VENOGRAPHY;  Surgeon: Cherre Robins, MD;  Location: [place] [facility];  Service: Cardiovascular;  Laterality: N/A;    Prior to Admission medications   Medication Sig Start Date End Date Taking? Authorizing Provider  acetaminophen (TYLENOL) 325 MG tablet Take 650 mg by mouth daily as needed for headache (pain).    [provider]  albuterol (PROVENTIL) (2.5 MG/3ML) 0.083% nebulizer solution Take 2.5 mg by nebulization 4 (four) times daily as needed for wheezing or shortness of breath.  12/22/19   [provider]  allopurinol (ZYLOPRIM) 100 MG tablet Take 1 tablet (100 mg total) by mouth 2 (two) times daily. 08/09/19   Nita Sells, MD  amiodarone (PACERONE) 200 MG tablet Take 1 tablet (200 mg total) by mouth in the morning. 01/15/21   Dagoberto Ligas, PA-C  apixaban (ELIQUIS) 2.5 MG TABS tablet Take 2.5 mg by mouth 2 (two) times daily.    [provider]  aspirin EC 81 MG tablet Take 81 mg by mouth in the morning.    [provider]  atorvastatin (LIPITOR) 40 MG tablet Take 1 tablet  (40 mg total) by mouth daily in the afternoon. In the afternoon Patient not taking: Reported on 02/06/2021 01/15/21   Dagoberto Ligas, PA-C  Biotin 5000 MCG TABS Take 5,000 mcg by mouth in the morning.    [provider]  Camphor-Menthol-Methyl Sal (SALONPAS) 3.08-10-08 % PTCH Place 1 patch onto the skin daily as needed (pain).    [provider]  Carboxymethylcellul-Glycerin (CLEAR EYES FOR DRY EYES) 1-0.25 % SOLN Place 1 drop into both eyes daily as needed (dry eys).    [provider]  Cholecalciferol (VITAMIN D3) 50 MCG (2000 UT) TABS Take 2,000 Units by mouth in the morning.    [provider]  doxercalciferol (HECTOROL) 0.5 MCG capsule Take 0.5 mcg by mouth every dialysis. Patient not taking: Reported on 02/06/2021 09/21/20 09/20/21  [provider]  esomeprazole (NEXIUM) 40 MG capsule Take 40 mg by mouth in the morning. 12/06/19   [provider]  ferric citrate (AURYXIA) 1 GM 210 MG(Fe) tablet Take 210 mg by mouth 3 (three) times daily with meals.    [provider]  Fluticasone-Umeclidin-Vilant (TRELEGY ELLIPTA) 100-62.5-25 MCG/INH AEPB Inhale 1 puff into the lungs daily at 12 noon.    [provider]  gabapentin (NEURONTIN) 100 MG capsule Take 100 mg by mouth at bedtime. 12/29/19   [provider]  insulin glargine (LANTUS) 100 unit/mL SOPN Inject into the skin daily.    [provider]  insulin lispro (HUMALOG) 100 UNIT/ML injection Inject 5-16 Units into the skin 3 (three) times daily as needed (greater than 120). 11/28/20- "has not take in a while".    [provider]  lidocaine (LIDODERM) 5 % Place 1 patch onto the skin daily as needed (pain.). Remove & Discard patch within 12 hours or as directed by MD 01/15/21   Dagoberto Ligas, PA-C  Methoxy PEG-Epoetin Beta (MIRCERA IJ) Mircera 09/14/20 12/13/21  [provider]  midodrine (PROAMATINE) 10 MG tablet Take 1 tablet (10 mg total) by mouth 3  (three) times daily with meals. 04/28/20   Geradine Girt, DO  naproxen sodium (ALEVE) 220 MG tablet Take 220 mg by mouth daily as needed (pain).    [provider]  nitroGLYCERIN (NITROSTAT) 0.4 MG SL tablet  Place 0.4 mg under the tongue every 5 (five) minutes x 3 doses as needed for chest pain. 12/07/19   [provider]  ondansetron (ZOFRAN) 4 MG tablet Take 4 mg by mouth every 8 (eight) hours as needed for nausea/vomiting, nausea or vomiting. 12/29/19   [provider]  oxyCODONE-acetaminophen (PERCOCET) 5-325 MG tablet Take 1 tablet by mouth every 6 (six) hours as needed for severe pain. 01/15/21   Dagoberto Ligas, PA-C  OXYGEN Inhale 2 L into the lungs See admin instructions. Use every night and as needed during the day    [provider]  Phenylephrine-DM-GG-APAP (MUCINEX SINUS-MAX) 5-10-200-325 MG TABS Take 1 tablet by mouth 2 (two) times daily as needed (sinus issues).    [provider]  PRESCRIPTION MEDICATION Inhale into the lungs at bedtime. Bipap    [provider]    Current Facility-Administered Medications  Medication Dose Route Frequency Provider Last Rate Last Admin   0.9 %  sodium chloride infusion  250 mL Intravenous PRN Cherre Robins, MD       sodium chloride flush (NS) 0.9 % injection 3 mL  3 mL Intravenous Q12H Cherre Robins, MD       sodium chloride flush (NS) 0.9 % injection 3 mL  3 mL Intravenous PRN Cherre Robins, MD       Current Outpatient Medications  Medication Sig Dispense Refill   acetaminophen (TYLENOL) 325 MG tablet Take 650 mg by mouth daily as needed for headache (pain).     albuterol (PROVENTIL) (2.5 MG/3ML) 0.083% nebulizer solution Take 2.5 mg by nebulization 4 (four) times daily as needed for wheezing or shortness of breath.      allopurinol (ZYLOPRIM) 100 MG tablet Take 1 tablet (100 mg total) by mouth 2 (two) times daily.     amiodarone (PACERONE) 200 MG tablet Take 1 tablet (200 mg total) by  mouth in the morning.     apixaban (ELIQUIS) 2.5 MG TABS tablet Take 2.5 mg by mouth 2 (two) times daily.     aspirin EC 81 MG tablet Take 81 mg by mouth in the morning.     atorvastatin (LIPITOR) 40 MG tablet Take 1 tablet (40 mg total) by mouth daily in the afternoon. In the afternoon (Patient not taking: Reported on 02/06/2021)     Biotin 5000 MCG TABS Take 5,000 mcg by mouth in the morning.     Camphor-Menthol-Methyl Sal (SALONPAS) 3.08-10-08 % PTCH Place 1 patch onto the skin daily as needed (pain).     Carboxymethylcellul-Glycerin (CLEAR EYES FOR DRY EYES) 1-0.25 % SOLN Place 1 drop into both eyes daily as needed (dry eys).     Cholecalciferol (VITAMIN D3) 50 MCG (2000 UT) TABS Take 2,000 Units by mouth in the morning.     doxercalciferol (HECTOROL) 0.5 MCG capsule Take 0.5 mcg by mouth every dialysis. (Patient not taking: Reported on 02/06/2021)     esomeprazole (NEXIUM) 40 MG capsule Take 40 mg by mouth in the morning.     ferric citrate (AURYXIA) 1 GM 210 MG(Fe) tablet Take 210 mg by mouth 3 (three) times daily with meals.     Fluticasone-Umeclidin-Vilant (TRELEGY ELLIPTA) 100-62.5-25 MCG/INH AEPB Inhale 1 puff into the lungs daily at 12 noon.     gabapentin (NEURONTIN) 100 MG capsule Take 100 mg by mouth at bedtime.     insulin glargine (LANTUS) 100 unit/mL SOPN Inject into the skin daily.     insulin lispro (HUMALOG) 100 UNIT/ML injection Inject  5-16 Units into the skin 3 (three) times daily as needed (greater than 120). 11/28/20- "has not take in a while".     lidocaine (LIDODERM) 5 % Place 1 patch onto the skin daily as needed (pain.). Remove & Discard patch within 12 hours or as directed by MD     Methoxy PEG-Epoetin Beta (MIRCERA IJ) Mircera     midodrine (PROAMATINE) 10 MG tablet Take 1 tablet (10 mg total) by mouth 3 (three) times daily with meals.     naproxen sodium (ALEVE) 220 MG tablet Take 220 mg by mouth daily as needed (pain).     nitroGLYCERIN (NITROSTAT) 0.4 MG SL tablet Place  0.4 mg under the tongue every 5 (five) minutes x 3 doses as needed for chest pain.     ondansetron (ZOFRAN) 4 MG tablet Take 4 mg by mouth every 8 (eight) hours as needed for nausea/vomiting, nausea or vomiting.     oxyCODONE-acetaminophen (PERCOCET) 5-325 MG tablet Take 1 tablet by mouth every 6 (six) hours as needed for severe pain. 20 tablet 0   OXYGEN Inhale 2 L into the lungs See admin instructions. Use every night and as needed during the day     Phenylephrine-DM-GG-APAP (MUCINEX SINUS-MAX) 5-10-200-325 MG TABS Take 1 tablet by mouth 2 (two) times daily as needed (sinus issues).     PRESCRIPTION MEDICATION Inhale into the lungs at bedtime. Bipap      Allergies as of 02/25/2021   (No Known Allergies)    Family History  Problem Relation Age of Onset   Colon cancer Brother        diagnosed age 34. Living.    Ulcers Sister    Diabetes Sister    Heart attack Sister    Kidney failure Sister    Stroke Sister    Ulcers Mother    Diabetes Mother    Heart attack Mother    Stroke Mother    Asthma Mother    Heart disease Mother    Cervical cancer Mother    Heart attack Brother    Heart disease Brother    Asthma Sister    Diabetes Brother    Stroke Maternal Grandmother    Heart attack Maternal Grandmother    Heart attack Other    Early death Father        MVA in his 59s    Social History   Socioeconomic History   Marital status: Widowed    Spouse name: [place]   Number of children: 4   Years of education: Not on file   Highest education level: 10th grade  Occupational History   Occupation: retired    Comment: Ambulance person, [employer] work   Occupation: retired  Tobacco Use   Smoking status: Former    Packs/day: 1.00    Years: 1.00    Pack years: 1.00    Types: Cigarettes    Start date: 02/19/1961    Quit date: 08/05/1961    Years since quitting: 59.6   Smokeless tobacco: Never   Tobacco comments:    1 year in her lifetime  Vaping Use   Vaping Use: Never used  Substance  and Sexual Activity   Alcohol use: Never   Drug use: Never   Sexual activity: Not Currently  Other Topics Concern   Not on file  Social History Narrative   ** Merged History Encounter **       HAS 4 SON-GRAND KIDS [place]. Lives in home with Clarion -  married 13 Y Cook, sew, quilt, crochet   Social Determinants of Health   Financial Resource Strain: Not on file  Food Insecurity: Not on file  Transportation Needs: Not on file  Physical Activity: Not on file  Stress: Not on file  Social Connections: Not on file  Intimate Partner Violence: Not on file    Review of Systems: See HPI, all other systems reviewed and are negative Physical Exam: Vital signs in last 24 hours: Temp:  [98.7 F (37.1 C)] 98.7 F (37.1 C) (07/24 0855) Pulse Rate:  [77-82] 79 (07/24 1330) Resp:  [15-20] 15 (07/24 1330) BP: (124-151)/(33-64) 124/45 (07/24 1330) SpO2:  [94 %-98 %] 94 % (07/24 1330)   General: Fatigued appearing 75 year old female in no acute distress. Head:  Normocephalic and atraumatic. Eyes:  No scleral icterus. Conjunctiva pink. Ears:  Normal auditory acuity. Nose:  No deformity, discharge or lesions. Mouth:  Dentition intact. No ulcers or lesions.  Neck:  Supple. No lymphadenopathy or thyromegaly.  Lungs: Breath sounds clear throughout. Heart: Regular rate and rhythm, no murmurs. Abdomen: Soft, protuberant.  Mild tenderness RUQ without rebound or guarding.  Positive bowel sounds all 4 quadrants. Rectal: Deferred.  ED physician reported brown stool in the rectal vault heme positive. Musculoskeletal:  Symmetrical without gross deformities.  Pulses:  Normal pulses noted. Extremities:  Without clubbing or edema. Neurologic:  Alert and  oriented x4. No focal deficits.  Skin:  Intact without significant lesions or rashes. Psych:  Alert and cooperative. Normal mood and affect.  Intake/Output from previous day: No intake/output data recorded. Intake/Output this  shift: No intake/output data recorded.  Lab Results: Recent Labs    02/25/21 0914  WBC 8.7  HGB 10.2*  HCT 32.3*  PLT 352   BMET Recent Labs    02/25/21 0914  NA 137  K 3.8  CL 97*  CO2 28  GLUCOSE 109*  BUN 11  CREATININE 3.58*  CALCIUM 9.2   LFT Recent Labs    02/25/21 0914  PROT 6.3*  ALBUMIN 2.4*  AST 13*  ALT 9  ALKPHOS 85  BILITOT 0.6   PT/INR No results for input(s): LABPROT, INR in the last 72 hours. Hepatitis Panel No results for input(s): HEPBSAG, HCVAB, HEPAIGM, HEPBIGM in the last 72 hours.    Studies/Results: DG Chest 2 View  Result Date: 02/25/2021 CLINICAL DATA:  Cough. EXAM: CHEST - 2 VIEW COMPARISON:  07/19/2020 FINDINGS: The lungs are clear without focal pneumonia, edema, pneumothorax or pleural effusion. The cardiopericardial silhouette is within normal limits for size. Large hiatal hernia again noted. Right Port-A-Cath is been removed in the interval. Left IJ central line remains in place. IMPRESSION: 1. No acute cardiopulmonary findings. 2. Large hiatal hernia. Electronically Signed   By: Misty Stanley M.D.   On: 02/25/2021 09:25    IMPRESSION/PLAN:  75 year old female with a history of GERD/dysphagia and a large hiatal hernia/Cameron ulcers presents to the ED with N/V, RUQ pain, melenic stool and BRBPR with anemia.  Hg 10.2 (baseline Hg 12.6-13.3).  Rectal exam per the ED physician showed brown stool which was heme positive. -Tentatively schedule EGD with Dr. Lyndel Safe 02/26/2021.  EGD to rule out Cameron lesions in setting of large hiatal hernia, erosive esophagitis,/UGI malignancy. -Clear liquid diet -NPO after midnight -Monitor H&H closely -Transfuse for hemoglobin less then 8 -PPI IV -Hold Eliquis -Consider general surgery consult to consider future hiatal hernia surgery  -Further recommendations per Dr. Havery Moros  Bright red blood per the rectum, small quantity  and intermittent -Colonoscopy deferred for now  History of colon  polyp.  Most recent colonoscopy 02/2020 showed diverticulosis without colon polyps.  History of atrial fibrillation.  Last dose of Eliquis was taken at 8:30 AM on 02/24/2021 -Continue to hold Eliquis  ESRD on HD every Tu/Th/Sat, last dialysis session was 02/24/2021 which was discontinued early secondary to hypotension.  Nonobstructive CAD  COPD and OSA   Noralyn Pick  02/25/2021, 2:14 PM

## 2021-02-25 NOTE — Consult Note (Signed)
Referring Provider: Dr. Lennice Sites  Primary Care Physician:  [name], MD Primary Gastroenterologist:  Dr.Rourk   Reason for Consultation: Anemia, positive FOBT  HPI: [name] is a 75 y.o. female with a past medical history of CAD, atrial fibrillation on Eliquis, diabetes mellitus type 2, ESRD on hemodialysis Tu/Th/Sat, anemia, COPD, OSA, COVID-pneumonia 07/20/2019 which required hospital admission and treatment with Remdesivir and Solu-Medrol, UGI bleed with coffee ground emesis occurred during her 07/2019 hospital admission, GERD, large hiatal hernia, Schatzki's ring and tubular adenomatous colorectal polyps.    She reports feeling fatigued and generally unwell for the past 2 weeks.  Approximately 7 to 10 days ago she noticed her solid stools were black in color.  She also had 1 or 2 days of diarrhea over the past 2 weeks.  She has been experiencing RUQ pain for the past 3 days.  She developed nausea yesterday while on dialysis and her dialysis session was discontinued early because she was hypotensive.  She remained at the dialysis center for monitoring then was stable so she was discharged home.  Later that evening, she vomited up clear white emesis x 2 or 3 episodes. No coffee-ground or frank hematemesis.  This morning she continued to feel unwell with nausea and dry heaves so she presented to the ED for further evaluation.  She also noted having a cough for the past week or so, clear to white sputum.  No hemoptysis.    Labs in the ED showed a hemoglobin level of 10.2 (baseline Hg 12.6 - 13.3). Hematocrit 32.3.  Platelet 352.  Sodium 137.  Potassium 3.8.  BUN 11.  Creatinine 3.58.  Alk phos 85.  Lipase 35.  AST 13.  ALT 9.  Total bili 0.6.  SARS coronavirus 2 negative.  Chest x-ray showed a large hiatal hernia, no evidence of pneumonia or pulmonary effusion.  She is fatigued.  No current nausea, dry heaves or vomiting.  She has mild RUQ pain.  She reports having  intermittent heartburn with dysphagia which occurs once or twice weekly or daily for the past few months.  She describes having food which gets stuck to her upper esophagus, she typically gags and stuck food is expelled.  She is on Nexium 40 mg every morning and Famotidine nightly.  She is followed by Dr. Gala Romney at the [facility].  Her most recent EGD was 07/19/2019 which identified a large hiatal hernia, erosive reflux esophagitis and a Schatzki's ring was dilated.  Her most recent colonoscopy was 01/26/2016 which identified 3 tubular adenomatous polyps which were removed from the colon.  Brother with history of colon cancer.  No family history of esophageal or gastric cancer.  History of atrial fibrillation, her last dose of Eliquis was taken at 8 AM on Saturday 7/23.  History of CAD on ASA 81 mg once daily.  No other NSAID use.  She is on ferric citrate 3 times daily.  Past GI procedures:  Colonoscopy 02/10/2020 by Dr. Gala Romney: - Diverticulosis in the entire examined colon. Redundant colon. - The examination was otherwise normal on direct and retroflexion views. - No specimens collected.  Colonoscopy 01/26/2016: LOOSE/WATERY STOOLS MOST LIKELY DUE TO IB-C WITH OVERFLOW INCONTINENCE - PANCOLONIC Diverticulosis . - THREE COLORECTAL POLYPS REMOVED - MODERATELY Redundant colon. - Non-bleeding external and internal hemorrhoids. 1. Colon, biopsy, random - BENIGN COLORECTAL MUCOSA. - ASSOCIATED BENIGN LYMPHOID AGGREGATES. - NO EVIDENCE OF SIGNIFICANT INFLAMMATION, DYSPLASIA, OR MALIGNANCY. 2. Colon, polyp(s), right - TUBULAR ADENOMA,  TWO FRAGMENTS. - NO HIGH GRADE DYSPLASIA OR MALIGNANCY IDENTIFIED. 3. Colon, polyp(s), descending - TUBULAR ADENOMA (X1). - NO HIGH GRADE DYSPLASIA OR MALIGNANCY IDENTIFIED  EGD 07/19/2019 secondary to UGI bleed/coffee ground emesis -Mild erosive reflux esophagitis. - Mild Schatzki ring. -Dilated. Large hiatal hernia. Erythematous gastric mucosa. - Normal  duodenal bulb and second - Normal duodenal bulb and second portion of the duodenum. - No specimens collected.  EGD 08/06/2013: Stricture at the GE junction Hiatal hernia Nonerosive gastritis  History of incomplete study of the small bowel in 2015 (capsule never reached the cecum) with evidence of small bowel ulcers.  Echo 07/27/2019: 1. Left ventricular ejection fraction, by visual estimation, is 55 to 60%. The left ventricle has normal function. There is mildly increased left ventricular hypertrophy. 2. Left ventricular diastolic function could not be evaluated. 3. The left ventricle has no regional wall motion abnormalities. 4. Global right ventricle has normal systolic function.The right ventricular size is normal. No increase in right ventricular wall thickness. 5. Left atrial size was moderately dilated. 6. Right atrial size was mildly dilated. 7. Mild mitral annular calcification. 8. The mitral valve is degenerative. Trivial mitral valve regurgitation. 9. The tricuspid valve is grossly normal. Tricuspid valve regurgitation moderate. 10. The tricuspid valve was normal in structure. Tricuspid valve regurgitation moderate. 11. The aortic valve is grossly normal. Aortic valve regurgitation is not visualized. 12. Moderately elevated pulmonary artery systolic pressure. 13. The tricuspid regurgitant velocity is 3.12 m/s, and with an assumed right atrial pressure of 15 mmHg, the estimated right ventricular systolic pressure is moderately elevated at 54.0 mmHg. 14. The inferior vena cava is dilated in size with <50% respiratory variability, suggesting right atrial pressure of 15 mmHg.  Past Medical History:  Diagnosis Date   Blood transfusion without reported diagnosis    CAD (coronary artery disease)    Nonobstructive at cardiac catheterization 2000   Cataract    Cervical cancer ([place]) 1978   CHF (congestive heart failure) (HCC)    Chronic back pain    Chronic kidney disease     Class 2 obesity with body mass index (BMI) of 35 to 39.9 without comorbidity    Complication of anesthesia    hard to awaken with one back surgery - years ago, no problem since   COPD (chronic obstructive pulmonary disease) ([place])    Degenerative disc disease    DM (diabetes mellitus), type 2 with renal complications ([place])    Dyslipidemia    Dyspnea    "due to COPD"   Dysrhythmia    a-fib   ESRD (end stage renal disease) on dialysis ([place])    TTHS- [address]   GERD (gastroesophageal reflux disease)    Gout    Hiatal hernia 07/27/2013   History of diverticulitis of colon    History of hiatal hernia    HTN (hypertension)    09/09/20 not currently on medication, is on medication for hypotension   Hypotension    Iron deficiency anemia    Irritable bowel syndrome    Lumbar radiculopathy    Mixed hyperlipidemia    Moderate major depression, 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 States this was cervical cancer   Oxygen deficiency    room air   PAF (paroxysmal atrial fibrillation) (HCC)    Pneumonia    Sleep apnea    bipap - oxygen  2 l to bipap.   Type 2 diabetes mellitus ([place])    Vitamin B deficiency 12/25/2009   Vitamin B12 deficiency     Past Surgical History:  Procedure Laterality Date   ABDOMINAL HYSTERECTOMY     APPLICATION OF WOUND VAC Right 01/15/2021   Procedure: APPLICATION OF WOUND VAC;  Surgeon: Cherre Robins, MD;  Location: [place];  Service: Vascular;  Laterality: Right;   AV FISTULA PLACEMENT Left 08/05/2019   Procedure: ARTERIOVENOUS (AV) FISTULA CREATION LEFT ARM;  Surgeon: Waynetta Sandy, MD;  Location: [place];  Service: Vascular;  Laterality: Left;   AV FISTULA PLACEMENT Left 09/11/2020   Procedure: INSERTION OF ARTERIOVENOUS (AV) GORE-TEX GRAFT ARM LEFT;  Surgeon: Cherre Robins, MD;  Location: [place];  Service: Vascular;  Laterality: Left;    AV FISTULA PLACEMENT Right 11/29/2020   Procedure: Creation of Arteriovenous Fistula Right Upper Arm;  Surgeon: Rosetta Posner, MD;  Location: [place];  Service: Vascular;  Laterality: Right;   [place] Left 10/05/2019   Procedure: SECOND STAGE LEFT BASCILIC VEIN TRANSPOSITION;  Surgeon: Waynetta Sandy, MD;  Location: [place];  Service: Vascular;  Laterality: Left;   [place] Right 01/15/2021   Procedure: RIGHT UPPER ARM SECOND STAGE [place];  Surgeon: Cherre Robins, MD;  Location: [facility];  Service: Vascular;  Laterality: Right;  PERIPHERAL NERVE BLOCK   Benign breast cysts     CHOLECYSTECTOMY     COLONOSCOPY  10/01/2006   SLF:Pan colonic diverticulosis and moderate internal hemorrhoids/ Otherwise no polyps, masses, inflammatory changes or AVMs/   COLONOSCOPY  2011   SLF: pancolonic diverticulosis, large internal hemorrhoids   COLONOSCOPY N/A 01/26/2016   Procedure: COLONOSCOPY;  Surgeon: [name], MD;  [facility];  Service: Endoscopy;  Laterality: N/A;  830    COLONOSCOPY WITH PROPOFOL N/A 02/10/2020   Procedure: COLONOSCOPY WITH PROPOFOL;  Surgeon: Daneil Dolin, MD;  [facility];  Service: Endoscopy;  Laterality: N/A;  10:45am   ESOPHAGOGASTRODUODENOSCOPY  11/19/2006   SLF: Large hiatal hernia without evidence of Cameron ulcers/. Distal esophageal stricture, which allowed the gastroscope to pass without resistance.  A 16 mm [place] later passed with mild resistance/ Normal stomach.sb bx negative   ESOPHAGOGASTRODUODENOSCOPY  10/01/2006   YME:BRAXE hiatal hernia.  Distal esophagus without evidence of   erythema, ulceration or Barrett's esophagus   ESOPHAGOGASTRODUODENOSCOPY  2011   SLF: large hh, distal esophageal web narrowing to 41m s/p dilation to 172m  ESOPHAGOGASTRODUODENOSCOPY N/A 08/06/2013   SLF: 1. Stricture at the gastroesophageal junction 2. large hiatal hernia. 3. Mild  erosive gastritis.   ESOPHAGOGASTRODUODENOSCOPY (EGD) WITH PROPOFOL N/A 07/19/2019   rourk: Mild erosive reflux esophagitis.  Mild Schatzki ring status post dilation.  Large hiatal hernia with at least one half of the stomach above the diaphragm.  Gastric mucosa erythematous.   GIVENS CAPSULE STUDY N/A 08/06/2013   INCOMPLETE-SMALL BOWLE ULCERS   IR FLUORO GUIDE CV LINE LEFT  07/29/2019   IR [place] VASC ACCESS LEFT  07/29/2019   KNEE SURGERY Right    PARTIAL HYSTERECTOMY  1978   PORT-A-CATH REMOVAL Right 11/29/2020   Procedure: REMOVAL OF RIGHT CHEST PORT;  Surgeon: EaRosetta PosnerMD;  [facility] Service: [facility];  Laterality: Right;   small bowel capsule  2008   negative   SPINE SURGERY     TONSILLECTOMY AND ADENOIDECTOMY  Two back surgeries/fusion     UMBILICAL HERNIA REPAIR  2010   UPPER EXTREMITY VENOGRAPHY N/A 11/22/2020   Procedure: UPPER EXTREMITY VENOGRAPHY;  Surgeon: Cherre Robins, MD;  Location: [place] [facility];  Service: Cardiovascular;  Laterality: N/A;    Prior to Admission medications   Medication Sig Start Date End Date Taking? Authorizing Provider  acetaminophen (TYLENOL) 325 MG tablet Take 650 mg by mouth daily as needed for headache (pain).    [provider]  albuterol (PROVENTIL) (2.5 MG/3ML) 0.083% nebulizer solution Take 2.5 mg by nebulization 4 (four) times daily as needed for wheezing or shortness of breath.  12/22/19   [provider]  allopurinol (ZYLOPRIM) 100 MG tablet Take 1 tablet (100 mg total) by mouth 2 (two) times daily. 08/09/19   Nita Sells, MD  amiodarone (PACERONE) 200 MG tablet Take 1 tablet (200 mg total) by mouth in the morning. 01/15/21   Dagoberto Ligas, PA-C  apixaban (ELIQUIS) 2.5 MG TABS tablet Take 2.5 mg by mouth 2 (two) times daily.    [provider]  aspirin EC 81 MG tablet Take 81 mg by mouth in the morning.    [provider]  atorvastatin (LIPITOR) 40 MG tablet Take 1 tablet  (40 mg total) by mouth daily in the afternoon. In the afternoon Patient not taking: Reported on 02/06/2021 01/15/21   Dagoberto Ligas, PA-C  Biotin 5000 MCG TABS Take 5,000 mcg by mouth in the morning.    [provider]  Camphor-Menthol-Methyl Sal (SALONPAS) 3.08-10-08 % PTCH Place 1 patch onto the skin daily as needed (pain).    [provider]  Carboxymethylcellul-Glycerin (CLEAR EYES FOR DRY EYES) 1-0.25 % SOLN Place 1 drop into both eyes daily as needed (dry eys).    [provider]  Cholecalciferol (VITAMIN D3) 50 MCG (2000 UT) TABS Take 2,000 Units by mouth in the morning.    [provider]  doxercalciferol (HECTOROL) 0.5 MCG capsule Take 0.5 mcg by mouth every dialysis. Patient not taking: Reported on 02/06/2021 09/21/20 09/20/21  [provider]  esomeprazole (NEXIUM) 40 MG capsule Take 40 mg by mouth in the morning. 12/06/19   [provider]  ferric citrate (AURYXIA) 1 GM 210 MG(Fe) tablet Take 210 mg by mouth 3 (three) times daily with meals.    [provider]  Fluticasone-Umeclidin-Vilant (TRELEGY ELLIPTA) 100-62.5-25 MCG/INH AEPB Inhale 1 puff into the lungs daily at 12 noon.    [provider]  gabapentin (NEURONTIN) 100 MG capsule Take 100 mg by mouth at bedtime. 12/29/19   [provider]  insulin glargine (LANTUS) 100 unit/mL SOPN Inject into the skin daily.    [provider]  insulin lispro (HUMALOG) 100 UNIT/ML injection Inject 5-16 Units into the skin 3 (three) times daily as needed (greater than 120). 11/28/20- "has not take in a while".    [provider]  lidocaine (LIDODERM) 5 % Place 1 patch onto the skin daily as needed (pain.). Remove & Discard patch within 12 hours or as directed by MD 01/15/21   Dagoberto Ligas, PA-C  Methoxy PEG-Epoetin Beta (MIRCERA IJ) Mircera 09/14/20 12/13/21  [provider]  midodrine (PROAMATINE) 10 MG tablet Take 1 tablet (10 mg total) by mouth 3  (three) times daily with meals. 04/28/20   Geradine Girt, DO  naproxen sodium (ALEVE) 220 MG tablet Take 220 mg by mouth daily as needed (pain).    [provider]  nitroGLYCERIN (NITROSTAT) 0.4 MG SL tablet  Place 0.4 mg under the tongue every 5 (five) minutes x 3 doses as needed for chest pain. 12/07/19   [provider]  ondansetron (ZOFRAN) 4 MG tablet Take 4 mg by mouth every 8 (eight) hours as needed for nausea/vomiting, nausea or vomiting. 12/29/19   [provider]  oxyCODONE-acetaminophen (PERCOCET) 5-325 MG tablet Take 1 tablet by mouth every 6 (six) hours as needed for severe pain. 01/15/21   Dagoberto Ligas, PA-C  OXYGEN Inhale 2 L into the lungs See admin instructions. Use every night and as needed during the day    [provider]  Phenylephrine-DM-GG-APAP (MUCINEX SINUS-MAX) 5-10-200-325 MG TABS Take 1 tablet by mouth 2 (two) times daily as needed (sinus issues).    [provider]  PRESCRIPTION MEDICATION Inhale into the lungs at bedtime. Bipap    [provider]    Current Facility-Administered Medications  Medication Dose Route Frequency Provider Last Rate Last Admin   0.9 %  sodium chloride infusion  250 mL Intravenous PRN Cherre Robins, MD       sodium chloride flush (NS) 0.9 % injection 3 mL  3 mL Intravenous Q12H Cherre Robins, MD       sodium chloride flush (NS) 0.9 % injection 3 mL  3 mL Intravenous PRN Cherre Robins, MD       Current Outpatient Medications  Medication Sig Dispense Refill   acetaminophen (TYLENOL) 325 MG tablet Take 650 mg by mouth daily as needed for headache (pain).     albuterol (PROVENTIL) (2.5 MG/3ML) 0.083% nebulizer solution Take 2.5 mg by nebulization 4 (four) times daily as needed for wheezing or shortness of breath.      allopurinol (ZYLOPRIM) 100 MG tablet Take 1 tablet (100 mg total) by mouth 2 (two) times daily.     amiodarone (PACERONE) 200 MG tablet Take 1 tablet (200 mg total) by  mouth in the morning.     apixaban (ELIQUIS) 2.5 MG TABS tablet Take 2.5 mg by mouth 2 (two) times daily.     aspirin EC 81 MG tablet Take 81 mg by mouth in the morning.     atorvastatin (LIPITOR) 40 MG tablet Take 1 tablet (40 mg total) by mouth daily in the afternoon. In the afternoon (Patient not taking: Reported on 02/06/2021)     Biotin 5000 MCG TABS Take 5,000 mcg by mouth in the morning.     Camphor-Menthol-Methyl Sal (SALONPAS) 3.08-10-08 % PTCH Place 1 patch onto the skin daily as needed (pain).     Carboxymethylcellul-Glycerin (CLEAR EYES FOR DRY EYES) 1-0.25 % SOLN Place 1 drop into both eyes daily as needed (dry eys).     Cholecalciferol (VITAMIN D3) 50 MCG (2000 UT) TABS Take 2,000 Units by mouth in the morning.     doxercalciferol (HECTOROL) 0.5 MCG capsule Take 0.5 mcg by mouth every dialysis. (Patient not taking: Reported on 02/06/2021)     esomeprazole (NEXIUM) 40 MG capsule Take 40 mg by mouth in the morning.     ferric citrate (AURYXIA) 1 GM 210 MG(Fe) tablet Take 210 mg by mouth 3 (three) times daily with meals.     Fluticasone-Umeclidin-Vilant (TRELEGY ELLIPTA) 100-62.5-25 MCG/INH AEPB Inhale 1 puff into the lungs daily at 12 noon.     gabapentin (NEURONTIN) 100 MG capsule Take 100 mg by mouth at bedtime.     insulin glargine (LANTUS) 100 unit/mL SOPN Inject into the skin daily.     insulin lispro (HUMALOG) 100 UNIT/ML injection Inject  5-16 Units into the skin 3 (three) times daily as needed (greater than 120). 11/28/20- "has not take in a while".     lidocaine (LIDODERM) 5 % Place 1 patch onto the skin daily as needed (pain.). Remove & Discard patch within 12 hours or as directed by MD     Methoxy PEG-Epoetin Beta (MIRCERA IJ) Mircera     midodrine (PROAMATINE) 10 MG tablet Take 1 tablet (10 mg total) by mouth 3 (three) times daily with meals.     naproxen sodium (ALEVE) 220 MG tablet Take 220 mg by mouth daily as needed (pain).     nitroGLYCERIN (NITROSTAT) 0.4 MG SL tablet Place  0.4 mg under the tongue every 5 (five) minutes x 3 doses as needed for chest pain.     ondansetron (ZOFRAN) 4 MG tablet Take 4 mg by mouth every 8 (eight) hours as needed for nausea/vomiting, nausea or vomiting.     oxyCODONE-acetaminophen (PERCOCET) 5-325 MG tablet Take 1 tablet by mouth every 6 (six) hours as needed for severe pain. 20 tablet 0   OXYGEN Inhale 2 L into the lungs See admin instructions. Use every night and as needed during the day     Phenylephrine-DM-GG-APAP (MUCINEX SINUS-MAX) 5-10-200-325 MG TABS Take 1 tablet by mouth 2 (two) times daily as needed (sinus issues).     PRESCRIPTION MEDICATION Inhale into the lungs at bedtime. Bipap      Allergies as of 02/25/2021   (No Known Allergies)    Family History  Problem Relation Age of Onset   Colon cancer Brother        diagnosed age 34. Living.    Ulcers Sister    Diabetes Sister    Heart attack Sister    Kidney failure Sister    Stroke Sister    Ulcers Mother    Diabetes Mother    Heart attack Mother    Stroke Mother    Asthma Mother    Heart disease Mother    Cervical cancer Mother    Heart attack Brother    Heart disease Brother    Asthma Sister    Diabetes Brother    Stroke Maternal Grandmother    Heart attack Maternal Grandmother    Heart attack Other    Early death Father        MVA in his 59s    Social History   Socioeconomic History   Marital status: Widowed    Spouse name: [place]   Number of children: 4   Years of education: Not on file   Highest education level: 10th grade  Occupational History   Occupation: retired    Comment: Ambulance person, [employer] work   Occupation: retired  Tobacco Use   Smoking status: Former    Packs/day: 1.00    Years: 1.00    Pack years: 1.00    Types: Cigarettes    Start date: 02/19/1961    Quit date: 08/05/1961    Years since quitting: 59.6   Smokeless tobacco: Never   Tobacco comments:    1 year in her lifetime  Vaping Use   Vaping Use: Never used  Substance  and Sexual Activity   Alcohol use: Never   Drug use: Never   Sexual activity: Not Currently  Other Topics Concern   Not on file  Social History Narrative   ** Merged History Encounter **       HAS 4 SON-GRAND KIDS [place]. Lives in home with Clarion -  married 13 Y Cook, sew, quilt, crochet   Social Determinants of Health   Financial Resource Strain: Not on file  Food Insecurity: Not on file  Transportation Needs: Not on file  Physical Activity: Not on file  Stress: Not on file  Social Connections: Not on file  Intimate Partner Violence: Not on file    Review of Systems: See HPI, all other systems reviewed and are negative Physical Exam: Vital signs in last 24 hours: Temp:  [98.7 F (37.1 C)] 98.7 F (37.1 C) (07/24 0855) Pulse Rate:  [77-82] 79 (07/24 1330) Resp:  [15-20] 15 (07/24 1330) BP: (124-151)/(33-64) 124/45 (07/24 1330) SpO2:  [94 %-98 %] 94 % (07/24 1330)   General: Fatigued appearing 75 year old female in no acute distress. Head:  Normocephalic and atraumatic. Eyes:  No scleral icterus. Conjunctiva pink. Ears:  Normal auditory acuity. Nose:  No deformity, discharge or lesions. Mouth:  Dentition intact. No ulcers or lesions.  Neck:  Supple. No lymphadenopathy or thyromegaly.  Lungs: Breath sounds clear throughout. Heart: Regular rate and rhythm, no murmurs. Abdomen: Soft, protuberant.  Mild tenderness RUQ without rebound or guarding.  Positive bowel sounds all 4 quadrants. Rectal: Deferred.  ED physician reported brown stool in the rectal vault heme positive. Musculoskeletal:  Symmetrical without gross deformities.  Pulses:  Normal pulses noted. Extremities:  Without clubbing or edema. Neurologic:  Alert and  oriented x4. No focal deficits.  Skin:  Intact without significant lesions or rashes. Psych:  Alert and cooperative. Normal mood and affect.  Intake/Output from previous day: No intake/output data recorded. Intake/Output this  shift: No intake/output data recorded.  Lab Results: Recent Labs    02/25/21 0914  WBC 8.7  HGB 10.2*  HCT 32.3*  PLT 352   BMET Recent Labs    02/25/21 0914  NA 137  K 3.8  CL 97*  CO2 28  GLUCOSE 109*  BUN 11  CREATININE 3.58*  CALCIUM 9.2   LFT Recent Labs    02/25/21 0914  PROT 6.3*  ALBUMIN 2.4*  AST 13*  ALT 9  ALKPHOS 85  BILITOT 0.6   PT/INR No results for input(s): LABPROT, INR in the last 72 hours. Hepatitis Panel No results for input(s): HEPBSAG, HCVAB, HEPAIGM, HEPBIGM in the last 72 hours.    Studies/Results: DG Chest 2 View  Result Date: 02/25/2021 CLINICAL DATA:  Cough. EXAM: CHEST - 2 VIEW COMPARISON:  07/19/2020 FINDINGS: The lungs are clear without focal pneumonia, edema, pneumothorax or pleural effusion. The cardiopericardial silhouette is within normal limits for size. Large hiatal hernia again noted. Right Port-A-Cath is been removed in the interval. Left IJ central line remains in place. IMPRESSION: 1. No acute cardiopulmonary findings. 2. Large hiatal hernia. Electronically Signed   By: Misty Stanley M.D.   On: 02/25/2021 09:25    IMPRESSION/PLAN:  75 year old female with a history of GERD/dysphagia and a large hiatal hernia/Cameron ulcers presents to the ED with N/V, RUQ pain, melenic stool and BRBPR with anemia.  Hg 10.2 (baseline Hg 12.6-13.3).  Rectal exam per the ED physician showed brown stool which was heme positive. -Tentatively schedule EGD with Dr. Lyndel Safe 02/26/2021.  EGD to rule out Cameron lesions in setting of large hiatal hernia, erosive esophagitis,/UGI malignancy. -Clear liquid diet -NPO after midnight -Monitor H&H closely -Transfuse for hemoglobin less then 8 -PPI IV -Hold Eliquis -Consider general surgery consult to consider future hiatal hernia surgery  -Further recommendations per Dr. Havery Moros  Bright red blood per the rectum, small quantity  and intermittent -Colonoscopy deferred for now  History of colon  polyp.  Most recent colonoscopy 02/2020 showed diverticulosis without colon polyps.  History of atrial fibrillation.  Last dose of Eliquis was taken at 8:30 AM on 02/24/2021 -Continue to hold Eliquis  ESRD on HD every Tu/Th/Sat, last dialysis session was 02/24/2021 which was discontinued early secondary to hypotension.  Nonobstructive CAD  COPD and OSA   [name]  02/25/2021, 2:14 PM

## 2021-02-25 NOTE — ED Notes (Signed)
Pt requesting something to help her sleep. Dr. Marlowe Sax paged

## 2021-02-25 NOTE — ED Triage Notes (Addendum)
C/o nausea, vomiting, diarrhea, and generalized abd pain with dark stools since yesterday.  States it started at dialysis yesterday and they had to stop dialysis early.    Also reports bright red rectal bleeding.  Takes Eliquis.

## 2021-02-26 ENCOUNTER — Encounter (HOSPITAL_COMMUNITY)
Admission: EM | Disposition: A | Discharge status: Home / Self Care | Payer: Self-pay | Source: Home / Self Care | Attending: Internal Medicine

## 2021-02-26 ENCOUNTER — Observation Stay (HOSPITAL_COMMUNITY): Payer: Medicare Other | Admitting: Anesthesiology

## 2021-02-26 ENCOUNTER — Other Ambulatory Visit: Payer: Self-pay

## 2021-02-26 ENCOUNTER — Encounter (HOSPITAL_COMMUNITY): Payer: Self-pay | Admitting: Internal Medicine

## 2021-02-26 DIAGNOSIS — E1165 Type 2 diabetes mellitus with hyperglycemia: Secondary | ICD-10-CM | POA: Diagnosis present

## 2021-02-26 DIAGNOSIS — I2510 Atherosclerotic heart disease of native coronary artery without angina pectoris: Secondary | ICD-10-CM | POA: Diagnosis present

## 2021-02-26 DIAGNOSIS — Z7901 Long term (current) use of anticoagulants: Secondary | ICD-10-CM | POA: Diagnosis not present

## 2021-02-26 DIAGNOSIS — Z8616 Personal history of COVID-19: Secondary | ICD-10-CM | POA: Diagnosis not present

## 2021-02-26 DIAGNOSIS — I953 Hypotension of hemodialysis: Secondary | ICD-10-CM | POA: Diagnosis present

## 2021-02-26 DIAGNOSIS — Z6834 Body mass index (BMI) 34.0-34.9, adult: Secondary | ICD-10-CM | POA: Diagnosis not present

## 2021-02-26 DIAGNOSIS — I480 Paroxysmal atrial fibrillation: Secondary | ICD-10-CM | POA: Diagnosis present

## 2021-02-26 DIAGNOSIS — K922 Gastrointestinal hemorrhage, unspecified: Secondary | ICD-10-CM | POA: Diagnosis present

## 2021-02-26 DIAGNOSIS — K222 Esophageal obstruction: Secondary | ICD-10-CM | POA: Diagnosis present

## 2021-02-26 DIAGNOSIS — I959 Hypotension, unspecified: Secondary | ICD-10-CM | POA: Diagnosis present

## 2021-02-26 DIAGNOSIS — Z992 Dependence on renal dialysis: Secondary | ICD-10-CM | POA: Diagnosis not present

## 2021-02-26 DIAGNOSIS — K2951 Unspecified chronic gastritis with bleeding: Secondary | ICD-10-CM | POA: Diagnosis present

## 2021-02-26 DIAGNOSIS — E8889 Other specified metabolic disorders: Secondary | ICD-10-CM | POA: Diagnosis present

## 2021-02-26 DIAGNOSIS — K921 Melena: Secondary | ICD-10-CM | POA: Diagnosis not present

## 2021-02-26 DIAGNOSIS — E1142 Type 2 diabetes mellitus with diabetic polyneuropathy: Secondary | ICD-10-CM | POA: Diagnosis present

## 2021-02-26 DIAGNOSIS — J9611 Chronic respiratory failure with hypoxia: Secondary | ICD-10-CM | POA: Diagnosis present

## 2021-02-26 DIAGNOSIS — E669 Obesity, unspecified: Secondary | ICD-10-CM | POA: Diagnosis present

## 2021-02-26 DIAGNOSIS — G4733 Obstructive sleep apnea (adult) (pediatric): Secondary | ICD-10-CM | POA: Diagnosis present

## 2021-02-26 DIAGNOSIS — K2100 Gastro-esophageal reflux disease with esophagitis, without bleeding: Secondary | ICD-10-CM | POA: Diagnosis present

## 2021-02-26 DIAGNOSIS — N186 End stage renal disease: Secondary | ICD-10-CM | POA: Diagnosis present

## 2021-02-26 DIAGNOSIS — Z20822 Contact with and (suspected) exposure to covid-19: Secondary | ICD-10-CM | POA: Diagnosis present

## 2021-02-26 DIAGNOSIS — I132 Hypertensive heart and chronic kidney disease with heart failure and with stage 5 chronic kidney disease, or end stage renal disease: Secondary | ICD-10-CM | POA: Diagnosis present

## 2021-02-26 DIAGNOSIS — D62 Acute posthemorrhagic anemia: Secondary | ICD-10-CM | POA: Diagnosis present

## 2021-02-26 DIAGNOSIS — K449 Diaphragmatic hernia without obstruction or gangrene: Secondary | ICD-10-CM | POA: Diagnosis present

## 2021-02-26 DIAGNOSIS — J449 Chronic obstructive pulmonary disease, unspecified: Secondary | ICD-10-CM | POA: Diagnosis present

## 2021-02-26 DIAGNOSIS — E1122 Type 2 diabetes mellitus with diabetic chronic kidney disease: Secondary | ICD-10-CM | POA: Diagnosis present

## 2021-02-26 HISTORY — PX: BIOPSY: SHX5522

## 2021-02-26 HISTORY — PX: ESOPHAGOGASTRODUODENOSCOPY (EGD) WITH PROPOFOL: SHX5813

## 2021-02-26 HISTORY — PX: ESOPHAGEAL DILATION: SHX303

## 2021-02-26 LAB — RENAL FUNCTION PANEL
Albumin: 2.3 g/dL — ABNORMAL LOW (ref 3.5–5.0)
Anion gap: 12 (ref 5–15)
BUN: 19 mg/dL (ref 8–23)
CO2: 27 mmol/L (ref 22–32)
Calcium: 8.8 mg/dL — ABNORMAL LOW (ref 8.9–10.3)
Chloride: 96 mmol/L — ABNORMAL LOW (ref 98–111)
Creatinine, Ser: 4.99 mg/dL — ABNORMAL HIGH (ref 0.44–1.00)
GFR, Estimated: 8 mL/min — ABNORMAL LOW (ref >60)
Glucose, Bld: 92 mg/dL (ref 70–99)
Phosphorus: 4.8 mg/dL — ABNORMAL HIGH (ref 2.5–4.6)
Potassium: 4.2 mmol/L (ref 3.5–5.1)
Sodium: 135 mmol/L (ref 135–145)

## 2021-02-26 LAB — CBC
HCT: 31.4 % — ABNORMAL LOW (ref 36.0–46.0)
Hemoglobin: 10.1 g/dL — ABNORMAL LOW (ref 12.0–15.0)
MCH: 29.4 pg (ref 26.0–34.0)
MCHC: 32.2 g/dL (ref 30.0–36.0)
MCV: 91.3 fL (ref 80.0–100.0)
Platelets: 255 10*3/uL (ref 150–400)
RBC: 3.44 MIL/uL — ABNORMAL LOW (ref 3.87–5.11)
RDW: 15.5 % (ref 11.5–15.5)
WBC: 8.5 10*3/uL (ref 4.0–10.5)
nRBC: 0 % (ref 0.0–0.2)

## 2021-02-26 LAB — HEMOGLOBIN AND HEMATOCRIT, BLOOD
HCT: 32.5 % — ABNORMAL LOW (ref 36.0–46.0)
Hemoglobin: 10.2 g/dL — ABNORMAL LOW (ref 12.0–15.0)

## 2021-02-26 SURGERY — ESOPHAGOGASTRODUODENOSCOPY (EGD) WITH PROPOFOL
Anesthesia: Monitor Anesthesia Care

## 2021-02-26 MED ORDER — CHLORHEXIDINE GLUCONATE CLOTH 2 % EX PADS
6.0000 | MEDICATED_PAD | Freq: Every day | CUTANEOUS | Status: DC
  Administered 2021-02-27 – 2021-02-28 (×2): 6 via TOPICAL

## 2021-02-26 MED ORDER — CHLORHEXIDINE GLUCONATE CLOTH 2 % EX PADS: 6.0000 | MEDICATED_PAD | Freq: Every day | CUTANEOUS | Status: DC

## 2021-02-26 MED ORDER — PROPOFOL 10 MG/ML IV BOLUS
INTRAVENOUS | Status: DC | PRN
  Administered 2021-02-26: 40 mg via INTRAVENOUS

## 2021-02-26 MED ORDER — PROPOFOL 500 MG/50ML IV EMUL
INTRAVENOUS | Status: DC | PRN
  Administered 2021-02-26: 60 ug/kg/min via INTRAVENOUS

## 2021-02-26 MED ORDER — PANTOPRAZOLE SODIUM 40 MG IV SOLR
40.0000 mg | Freq: Two times a day (BID) | INTRAVENOUS | Status: DC
  Administered 2021-02-26 – 2021-02-28 (×4): 40 mg via INTRAVENOUS
  Filled 2021-02-26 (×4): qty 40

## 2021-02-26 MED ORDER — PHENYLEPHRINE 40 MCG/ML (10ML) SYRINGE FOR IV PUSH (FOR BLOOD PRESSURE SUPPORT)
PREFILLED_SYRINGE | INTRAVENOUS | Status: DC | PRN
  Administered 2021-02-26 (×3): 80 ug via INTRAVENOUS

## 2021-02-26 MED ORDER — SUCRALFATE 1 GM/10ML PO SUSP
1.0000 g | Freq: Two times a day (BID) | ORAL | Status: DC
  Administered 2021-02-26 – 2021-02-28 (×3): 1 g via ORAL
  Filled 2021-02-26 (×3): qty 10

## 2021-02-26 MED ORDER — FLUTICASONE FUROATE-VILANTEROL 100-25 MCG/INH IN AEPB
1.0000 | INHALATION_SPRAY | Freq: Every day | RESPIRATORY_TRACT | Status: DC
  Administered 2021-02-28: 1 via RESPIRATORY_TRACT
  Filled 2021-02-26: qty 28

## 2021-02-26 MED ORDER — SODIUM CHLORIDE 0.9 % IV SOLN: INTRAVENOUS | Status: DC

## 2021-02-26 MED ORDER — HYDROMORPHONE HCL 1 MG/ML IJ SOLN
0.5000 mg | INTRAMUSCULAR | Status: DC | PRN
  Administered 2021-02-26: 0.5 mg via INTRAVENOUS
  Filled 2021-02-26: qty 1

## 2021-02-26 MED ORDER — LIDOCAINE 2% (20 MG/ML) 5 ML SYRINGE
INTRAMUSCULAR | Status: DC | PRN
Start: 2021-02-26 — End: 2021-02-26
  Administered 2021-02-26: 50 mg via INTRAVENOUS
  Administered 2021-02-26: 100 mg via INTRAVENOUS

## 2021-02-26 MED ORDER — UMECLIDINIUM BROMIDE 62.5 MCG/INH IN AEPB
1.0000 | INHALATION_SPRAY | Freq: Every day | RESPIRATORY_TRACT | Status: DC
  Administered 2021-02-28: 1 via RESPIRATORY_TRACT
  Filled 2021-02-26: qty 7

## 2021-02-26 MED ORDER — SODIUM CHLORIDE 0.9 % IV SOLN: INTRAVENOUS | Status: DC | PRN

## 2021-02-26 SURGICAL SUPPLY — 15 items

## 2021-02-26 NOTE — Transfer of Care (Signed)
Immediate Anesthesia Transfer of Care Note  Patient: [name]  Procedure(s) Performed: ESOPHAGOGASTRODUODENOSCOPY (EGD) WITH PROPOFOL BIOPSY ESOPHAGEAL DILATION  Patient Location: PACU  Anesthesia Type:MAC  Level of Consciousness: awake, alert , oriented and patient cooperative  Airway & Oxygen Therapy: Patient Spontanous Breathing and Patient connected to nasal cannula oxygen  Post-op Assessment: Report given to RN and Post -op Vital signs reviewed and stable  Post vital signs: Reviewed and stable  Last Vitals:  Vitals Value Taken Time  BP 100/38 02/26/21 1100  Temp 36.6 C 02/26/21 1100  Pulse 99 02/26/21 1102  Resp 18 02/26/21 1102  SpO2 98 % 02/26/21 1102  Vitals shown include unvalidated device data.  Last Pain:  Vitals:   02/26/21 1100  TempSrc:   PainSc: 0-No pain         Complications: No notable events documented.

## 2021-02-26 NOTE — ED Notes (Signed)
Pt transported to endo.  

## 2021-02-26 NOTE — ED Notes (Signed)
Placed Breakfast Order 

## 2021-02-26 NOTE — Consult Note (Addendum)
Renal Service Consult Note [name]  [name] 02/26/2021 Sol Blazing, MD Requesting Physician: Dr Marlowe Sax  Reason for Consult: ESRD pt w/  HPI: The patient is a 75 y.o. year-old w/ hx of ESRD on HD, COPD, DM2, HL, gout, HTN, PAF, PNA, OSA who presented to ED w/ N/V and coffe-ground emesis.  Pt takes eliquis for afib. In ED BP's okay, labs okay, COVID neg, HB 10.9.  Stools were guiac +. GI consulted. Asked to see for HD.   Pt seen in ED in the hallway.  No SOB, cough, has not missed HD. C/o late HD symptoms of nausea and low BP's in the last hour.     ROS - denies CP, no joint pain, no HA, no blurry vision, no rash, no diarrhea   Past Medical History  Past Medical History:  Diagnosis Date   Blood transfusion without reported diagnosis    CAD (coronary artery disease)    Nonobstructive at cardiac catheterization 2000   Cataract    Cervical cancer ([place]) 1978   CHF (congestive heart failure) (HCC)    Chronic back pain    Chronic kidney disease    Class 2 obesity with body mass index (BMI) of 35 to 39.9 without comorbidity    Complication of anesthesia    hard to awaken with one back surgery - years ago, no problem since   COPD (chronic obstructive pulmonary disease) ([place])    Degenerative disc disease    DM (diabetes mellitus), type 2 with renal complications ([place])    Dyslipidemia    Dyspnea    "due to COPD"   Dysrhythmia    a-fib   ESRD (end stage renal disease) on dialysis ([place])    TTHS- [address]   GERD (gastroesophageal reflux disease)    Gout    Hiatal hernia 07/27/2013   History of diverticulitis of colon    History of hiatal hernia    HTN (hypertension)    09/09/20 not currently on medication, is on medication for hypotension   Hypotension    Iron deficiency anemia    Irritable bowel syndrome    Lumbar radiculopathy    Mixed hyperlipidemia    Moderate major depression, single episode ([place]) 07/21/2019   Neuropathy    OSA  (obstructive sleep apnea)    Osteoporosis    Ovarian cancer ([place]) 1978   patient denies. States this was cervical cancer   Oxygen deficiency    room air   PAF (paroxysmal atrial fibrillation) (HCC)    Pneumonia    Sleep apnea    bipap - oxygen 2 l to bipap.   Type 2 diabetes mellitus ([place])    Vitamin B deficiency 12/25/2009   Vitamin B12 deficiency    Past Surgical History  Past Surgical History:  Procedure Laterality Date   ABDOMINAL HYSTERECTOMY     APPLICATION OF WOUND VAC Right 01/15/2021   Procedure: APPLICATION OF WOUND VAC;  Surgeon: Cherre Robins, MD;  Location: [place];  Service: Vascular;  Laterality: Right;   AV FISTULA PLACEMENT Left 08/05/2019   Procedure: ARTERIOVENOUS (AV) FISTULA CREATION LEFT ARM;  Surgeon: Waynetta Sandy, MD;  Location: [place];  Service: Vascular;  Laterality: Left;   AV FISTULA PLACEMENT Left 09/11/2020   Procedure: INSERTION OF ARTERIOVENOUS (AV) GORE-TEX GRAFT ARM LEFT;  Surgeon: Cherre Robins, MD;  Location: [place];  Service: Vascular;  Laterality: Left;   AV FISTULA PLACEMENT Right 11/29/2020   Procedure: Creation  of Arteriovenous Fistula Right Upper Arm;  Surgeon: Rosetta Posner, MD;  Location: [place];  Service: Vascular;  Laterality: Right;   [place] Left 10/05/2019   Procedure: SECOND STAGE LEFT BASCILIC VEIN TRANSPOSITION;  Surgeon: Waynetta Sandy, MD;  Location: [place];  Service: Vascular;  Laterality: Left;   [place] Right 01/15/2021   Procedure: RIGHT UPPER ARM SECOND STAGE [place];  Surgeon: Cherre Robins, MD;  Location: [place];  Service: Vascular;  Laterality: Right;  PERIPHERAL NERVE BLOCK   Benign breast cysts     CHOLECYSTECTOMY     COLONOSCOPY  10/01/2006   SLF:Pan colonic diverticulosis and moderate internal hemorrhoids/ Otherwise no polyps, masses, inflammatory changes or AVMs/   COLONOSCOPY  2011   SLF: pancolonic diverticulosis,  large internal hemorrhoids   COLONOSCOPY N/A 01/26/2016   Procedure: COLONOSCOPY;  Surgeon: [name], MD;  [facility];  Service: Endoscopy;  Laterality: N/A;  830    COLONOSCOPY WITH PROPOFOL N/A 02/10/2020   Procedure: COLONOSCOPY WITH PROPOFOL;  Surgeon: Daneil Dolin, MD;  [facility];  Service: Endoscopy;  Laterality: N/A;  10:45am   ESOPHAGOGASTRODUODENOSCOPY  11/19/2006   SLF: Large hiatal hernia without evidence of [place] ulcers/. Distal esophageal stricture, which allowed the gastroscope to pass without resistance.  A 16 mm [place] later passed with mild resistance/ Normal stomach.sb bx negative   ESOPHAGOGASTRODUODENOSCOPY  10/01/2006   PNT:IRWER hiatal hernia.  Distal esophagus without evidence of   erythema, ulceration or Barrett's esophagus   ESOPHAGOGASTRODUODENOSCOPY  2011   SLF: large hh, distal esophageal web narrowing to 73mm s/p dilation to 63mm   ESOPHAGOGASTRODUODENOSCOPY N/A 08/06/2013   SLF: 1. Stricture at the gastroesophageal junction 2. large hiatal hernia. 3. Mild erosive gastritis.   ESOPHAGOGASTRODUODENOSCOPY (EGD) WITH PROPOFOL N/A 07/19/2019   rourk: Mild erosive reflux esophagitis.  Mild Schatzki ring status post dilation.  Large hiatal hernia with at least one half of the stomach above the diaphragm.  Gastric mucosa erythematous.   GIVENS CAPSULE STUDY N/A 08/06/2013   INCOMPLETE-SMALL BOWLE ULCERS   IR FLUORO GUIDE CV LINE LEFT  07/29/2019   IR US GUIDE VASC ACCESS LEFT  07/29/2019   KNEE SURGERY Right    PARTIAL HYSTERECTOMY  1978   PORT-A-CATH REMOVAL Right 11/29/2020   Procedure: REMOVAL OF RIGHT CHEST PORT;  Surgeon: Rosetta Posner, MD;  Location: [place];  Service: Vascular;  Laterality: Right;   small bowel capsule  2008   negative   SPINE SURGERY     TONSILLECTOMY AND ADENOIDECTOMY     Two back surgeries/fusion     UMBILICAL HERNIA REPAIR  2010   UPPER EXTREMITY VENOGRAPHY N/A 11/22/2020   Procedure: UPPER EXTREMITY  VENOGRAPHY;  Surgeon: Cherre Robins, MD;  Location: [place] [facility];  Service: Cardiovascular;  Laterality: N/A;   Family History  Family History  Problem Relation Age of Onset   Colon cancer Brother        diagnosed age 38. Living.    Ulcers Sister    Diabetes Sister    Heart attack Sister    Kidney failure Sister    Stroke Sister    Ulcers Mother    Diabetes Mother    Heart attack Mother    Stroke Mother    Asthma Mother    Heart disease Mother    Cervical cancer Mother    Heart attack Brother  Heart disease Brother    Asthma Sister    Diabetes Brother    Stroke Maternal Grandmother    Heart attack Maternal Grandmother    Heart attack Other    Early death Father        MVA in his 25s   Social History  reports that she quit smoking about 59 years ago. Her smoking use included cigarettes. She started smoking about 60 years ago. She has a 1.00 pack-year smoking history. She has never used smokeless tobacco. She reports that she does not drink alcohol and does not use drugs. Allergies No Known Allergies Home medications Prior to Admission medications   Medication Sig Start Date End Date Taking? Authorizing Provider  acetaminophen (TYLENOL) 325 MG tablet Take 650 mg by mouth daily as needed for headache (pain).   Yes [provider]  albuterol (PROVENTIL) (2.5 MG/3ML) 0.083% nebulizer solution Take 2.5 mg by nebulization 4 (four) times daily as needed for wheezing or shortness of breath.  12/22/19  Yes [provider]  allopurinol (ZYLOPRIM) 100 MG tablet Take 1 tablet (100 mg total) by mouth 2 (two) times daily. 08/09/19  Yes Nita Sells, MD  amiodarone (PACERONE) 200 MG tablet Take 1 tablet (200 mg total) by mouth in the morning. 01/15/21  Yes Dagoberto Ligas, PA-C  apixaban (ELIQUIS) 2.5 MG TABS tablet Take 2.5 mg by mouth 2 (two) times daily.   Yes [provider]  aspirin EC 81 MG tablet Take 81 mg by mouth in the morning.   Yes  [provider]  atorvastatin (LIPITOR) 40 MG tablet Take 40 mg by mouth daily in the afternoon. In the afternoon 01/15/21  Yes Iline Oven  Biotin 5000 MCG TABS Take 5,000 mcg by mouth in the morning.   Yes [provider]  Camphor-Menthol-Methyl Sal (SALONPAS) 3.08-10-08 % PTCH Place 1 patch onto the skin daily as needed (pain).   Yes [provider]  Carboxymethylcellul-Glycerin (CLEAR EYES FOR DRY EYES) 1-0.25 % SOLN Place 1 drop into both eyes daily as needed (dry eys).   Yes [provider]  Cholecalciferol (VITAMIN D3) 50 MCG (2000 UT) TABS Take 2,000 Units by mouth in the morning.   Yes [provider]  esomeprazole (NEXIUM) 40 MG capsule Take 40 mg by mouth in the morning. 12/06/19  Yes [provider]  ferric citrate (AURYXIA) 1 GM 210 MG(Fe) tablet Take 210 mg by mouth 3 (three) times daily with meals.   Yes [provider]  fluticasone (FLONASE) 50 MCG/ACT nasal spray Place 1 spray into both nostrils daily. 02/06/21  Yes [provider]  Fluticasone-Umeclidin-Vilant (TRELEGY ELLIPTA) 100-62.5-25 MCG/INH AEPB Inhale 1 puff into the lungs daily at 12 noon.   Yes [provider]  gabapentin (NEURONTIN) 100 MG capsule Take 100 mg by mouth at bedtime. 12/29/19  Yes [provider]  midodrine (PROAMATINE) 10 MG tablet Take 1 tablet (10 mg total) by mouth 3 (three) times daily with meals. 04/28/20  Yes Vann, Jessica U, DO  nitroGLYCERIN (NITROSTAT) 0.4 MG SL tablet Place 0.4 mg under the tongue every 5 (five) minutes x 3 doses as needed for chest pain. 12/07/19  Yes [provider]  omeprazole (PRILOSEC) 40 MG capsule Take 40 mg by mouth daily. 01/21/21  Yes [provider]  ondansetron (ZOFRAN) 4 MG tablet Take 4 mg by mouth every 8 (eight) hours as needed for nausea/vomiting, nausea or vomiting. 12/29/19  Yes [provider]  oxyCODONE-acetaminophen (PERCOCET) 5-325 MG  tablet Take  1 tablet by mouth every 6 (six) hours as needed for severe pain. 01/15/21  Yes Dagoberto Ligas, PA-C  OXYGEN Inhale 2 L into the lungs See admin instructions. Use every night and as needed during the day   Yes [provider]  [place] into the lungs at bedtime. Bipap   Yes [provider]     Vitals:   02/26/21 1114 02/26/21 1120 02/26/21 1327 02/26/21 1343  BP: (!) 133/32 (!) 144/45 115/61 (!) 110/41  Pulse: 93 94 83 85  Resp: (!) 24 16 18 19   Temp:   98.4 F (36.9 C) (!) 97.3 F (36.3 C)  TempSrc:   Oral Oral  SpO2: 94% 90% 96% 99%  Weight:    73.1 kg  Height:    4\' 10"  (1.473 m)   Exam Gen alert, no distress No rash, cyanosis or gangrene Sclera anicteric, throat clear  No jvd or bruits Chest clear bilat to bases, no rales/ wheezing RRR no MRG Abd soft ntnd no mass or ascites +bs GU normal MS no joint effusions or deformity Ext no LE or UE edema, no wounds or ulcers Neuro is alert, Ox 3 , nf L IJ TDC, RUA AVF +bruit     Home meds include allopurinol, amiodarone, eliquis 2.5 bid, lipitor , asa, neixum, auryixa 210 ac tid, trelegy elliipta, neurontin 100 hs, sl ntg prn, prilosec, percocet prn    OP HD: TTS [facility]  4h  74kg  2/2.5 bath   P4  TDC (R 2nd stage BB AVF 01/15/21)  Hep none   - mircera 100 q2, last 7/14, due 7/28  - venofer 100 x 10  - calcitriol 1.25 tiw po   Assessment/ Plan: GIB - w/ CG emesis, N/V.  Per GI and pmd.  ESRD: on HD x 2 years. Has not missed any HD recently.  Labs and vol  stable on exam. Plan for HD tomorrow.  Hypotension - c/o low BP's at OP HD. Was on midodrine in the past, not now. Get OP records.  Volume - no vol excess on exam, get records.  Anemia ckd  - hgb 10.9 here, follow.  MBD ckd: cont binder, Ca okay COPD/ OSA Afib on eliquis Gout DM2      Rob Noya Santarelli  MD 02/26/2021, 2:30 PM  Recent Labs  Lab 02/25/21 0914 02/25/21 1909 02/26/21 0500  WBC 8.7  --  8.5  HGB 10.2* 10.0* 10.1*    Recent Labs  Lab 02/25/21 0914 02/26/21 0500  K 3.8 4.2  BUN 11 19  CREATININE 3.58* 4.99*  CALCIUM 9.2 8.8*  PHOS  --  4.8*

## 2021-02-26 NOTE — Progress Notes (Signed)
[facility] Progress Note  Patient: [name]    ZOX:[id number]  DOA: 02/25/2021     Date of Service: the patient was seen and examined on 02/26/2021  Brief hospital course: Past medical history of ESRD on HD, HTN, HLD, PAF on Eliquis, GERD, OSA on BiPAP, recent COVID-19.  Presents with complaints of coffee colored emesis.  Nausea and vomiting. Underwent EGD on 7/25.  No evidence of active bleeding but large hiatal hernia.  Underwent dilation for Schatzki's ring.  Currently plan is Await 24 hours before resuming anticoagulation.  Subjective: Denies any acute complaint.  No nausea right now.  No vomiting this morning.  No blood in the stool.  No chest pain abdominal pain.  Assessment and Plan: 1.  Hematemesis Intractable nausea and vomiting. Large hiatal hernia  Gastritis Schatzki's ring s/p esophageal dilatation Underwent EGD on 7/25. Currently on PPI twice daily. Starting Carafate 1 g twice daily for 14 days. Resume Eliquis after 24 hours. High risk for gastric volvulus per GI.  Currently on full liquid diet.  If unable to tolerate p.o. will consider CT Abdo pelvis followed by upper GI series. GI will follow in the hospital.  Monitor CBC.  2.  ESRD on HD TTS at [facility] Since patient will not be able to discharge in time for her HD tomorrow.  Will discuss with nephrology for HD in the hospital.  3.  Paroxysmal A. fib on chronic anticoagulation Currently rate controlled with amiodarone. On Eliquis currently on hold. Monitor on telemetry. Resume tomorrow.  4.  Chronic respiratory failure COPD OSA On 2 LPM oxygen at home as well as BiPAP nightly. Continue oxygen as needed and inhalers. Does not appear to have any exacerbation. BiPAP nightly.  5.  Type II DM, uncontrolled with hyperglycemia with peripheral neuropathy without any long-term insulin use. Currently on sliding scale insulin. On gabapentin.  Monitor.  6.  Hypotension, chronic On  midodrine. Monitor.  7.  HLD. Refill discharge  Scheduled Meds:  allopurinol  100 mg Oral BID   amiodarone  200 mg Oral q AM   atorvastatin  40 mg Oral Q1500   [START ON 02/27/2021] Chlorhexidine Gluconate Cloth  6 each Topical Q0600   ferric citrate  210 mg Oral TID WC   fluticasone  1 spray Each Nare Daily   fluticasone furoate-vilanterol  1 puff Inhalation Daily   And   umeclidinium bromide  1 puff Inhalation Daily   gabapentin  100 mg Oral QHS   midodrine  10 mg Oral TID WC   pantoprazole (PROTONIX) IV  40 mg Intravenous Q24H   sodium chloride flush  3 mL Intravenous Q12H   sucralfate  1 g Oral BID   Continuous Infusions: PRN Meds: acetaminophen **OR** acetaminophen, albuterol, HYDROmorphone (DILAUDID) injection, melatonin, ondansetron **OR** ondansetron (ZOFRAN) IV, oxyCODONE-acetaminophen, polyvinyl alcohol  Body mass index is 33.67 kg/m.        DVT Prophylaxis:   SCDs Start: 02/25/21 1909    Advance goals of care discussion: Pt is Full code.  Family Communication: no family was present at bedside, at the time of interview.   Data Reviewed: I have personally reviewed and interpreted daily labs, tele strips, imaging. EGD shows gastritis and large hiatal hernia. No active bleeding. Hemoglobin stable. Electrolytes stable.  Physical Exam:  General: Appear in mild distress, no Rash; Oral Mucosa Clear, moist. no Abnormal Neck Mass Or lumps, Conjunctiva normal  Cardiovascular: S1 and S2 Present, no Murmur, Respiratory: good respiratory effort, Bilateral Air entry present  and CTA, no Crackles, no wheezes Abdomen: Bowel Sound present, Soft and no tenderness Extremities: no Pedal edema Neurology: alert and oriented to time, place, and person affect appropriate. no new focal deficit Gait not checked due to patient safety concerns  Vitals:   02/26/21 1114 02/26/21 1120 02/26/21 1327 02/26/21 1343  BP: (!) 133/32 (!) 144/45 115/61 (!) 110/41  Pulse: 93 94 83 85   Resp: (!) 24 16 18 19   Temp:   98.4 F (36.9 C) (!) 97.3 F (36.3 C)  TempSrc:   Oral Oral  SpO2: 94% 90% 96% 99%  Weight:    73.1 kg  Height:    4\' 10"  (1.473 m)    Disposition:  Status is: Observation  The patient will require care spanning > 2 midnights and should be moved to inpatient because: Ongoing diagnostic testing needed not appropriate for outpatient work up  Dispo: The patient is from: Home              Anticipated d/c is to: Home              Patient currently is not medically stable to d/c.   Difficult to place patient No        Time spent: 35 minutes. I reviewed all nursing notes, pharmacy notes, vitals, pertinent old records. I have discussed plan of care as described above with RN.  Author: Berle Mull, MD [facility] 02/26/2021 3:53 PM  To reach On-call, see care teams to locate the attending and reach out via [email or web address]. Between 7PM-7AM, please contact night-coverage If you still have difficulty reaching the attending provider, please page the [facility] (Director on Call) for [facility] on amion for assistance.

## 2021-02-26 NOTE — Progress Notes (Signed)
Patient refused BIPAP for HS. Patient aware to call for Respiratory if needed.

## 2021-02-26 NOTE — ED Notes (Signed)
RT placed pt on BIPAP dream station for the  night on settings of 12/6 w/3 LPM bled into the system. RT will continue to monitor.

## 2021-02-26 NOTE — Progress Notes (Signed)
NEW ADMISSION NOTE New Admission Note:   Arrival Method: stretcher Mental Orientation: A&O X 4 Telemetry: 5M14 Assessment: Completed Skin:  intact. Skin dry flaky. Bruising bilateral arms  IV: LAC Pain: 0/10 Tubes: hemodialysis catheter left chest, R upper arm fistula Safety Measures: Safety Fall Prevention Plan has been given, discussed and signed Admission: Completed [facility] [place] Orientation: Patient has been orientated to the room, unit and staff.  Family: none at bedside  Orders have been reviewed and implemented. Will continue to monitor the patient. Call light has been placed within reach and bed alarm has been activated.   Rayne Loiseau S Vaiden Adames, RN

## 2021-02-26 NOTE — Anesthesia Preprocedure Evaluation (Signed)
Anesthesia Evaluation  Patient identified by MRN, date of birth, ID band Patient awake    Reviewed: Allergy & Precautions, H&P , NPO status , Patient's Chart, lab work & pertinent test results  Airway Mallampati: II   Neck ROM: full    Dental   Pulmonary sleep apnea , COPD, former smoker,    breath sounds clear to auscultation       Cardiovascular hypertension,  Rhythm:regular Rate:Normal     Neuro/Psych PSYCHIATRIC DISORDERS Depression  Neuromuscular disease    GI/Hepatic GERD  ,  Endo/Other  diabetesMorbid obesity  Renal/GU ESRFRenal disease     Musculoskeletal  (+) Arthritis ,   Abdominal   Peds  Hematology  (+) Blood dyscrasia, anemia ,   Anesthesia Other Findings   Reproductive/Obstetrics                            Anesthesia Physical Anesthesia Plan  ASA: 3  Anesthesia Plan: MAC   Post-op Pain Management:    Induction: Intravenous  PONV Risk Score and Plan: 2 and Propofol infusion and Treatment may vary due to age or medical condition  Airway Management Planned: Nasal Cannula  Additional Equipment:   Intra-op Plan:   Post-operative Plan:   Informed Consent: I have reviewed the patients History and Physical, chart, labs and discussed the procedure including the risks, benefits and alternatives for the proposed anesthesia with the patient or authorized representative who has indicated his/her understanding and acceptance.     Dental advisory given  Plan Discussed with: CRNA, Anesthesiologist and Surgeon  Anesthesia Plan Comments:         Anesthesia Quick Evaluation

## 2021-02-26 NOTE — ED Notes (Signed)
Endo called to inform they will get pt at 7:30 or 9:00

## 2021-02-26 NOTE — Interval H&P Note (Signed)
History and Physical Interval Note:  02/26/2021 9:58 AM  [name]  has presented today for surgery, with the diagnosis of Anemia, melena, + FOBT, large hiatial hernia.  The various methods of treatment have been discussed with the patient and family. After consideration of risks, benefits and other options for treatment, the patient has consented to  Procedure(s): ESOPHAGOGASTRODUODENOSCOPY (EGD) WITH PROPOFOL (N/A) as a surgical intervention.  The patient's history has been reviewed, patient examined, no change in status, stable for surgery.  I have reviewed the patient's chart and labs.  Questions were answered to the patient's satisfaction.     [name]

## 2021-02-26 NOTE — Op Note (Signed)
[facility] Patient Name: [name] Procedure Date : 02/26/2021 MRN: [id number] Attending MD: Jackquline Denmark , MD Date of Birth: [birth year] CSN: [id number] Age: 75 Admit Type: Inpatient Procedure:                Upper GI endoscopy Indications:              75 y/o female with CAD, AF on Eliquis, ESRD on HD,                            OSA, COPD, who presented following hypotension with                            HD. She has had a drop in Hgb to 10.2 from baseline                            12-13s. Endorses some dark stools recently and                            upper abdominal pain with occ vomiting. Neg colon                            recently. Providers:                Jackquline Denmark, MD, Baird Cancer, RN, Tyrone Apple, Technician, Pollyann Glen, CRNA Referring MD:              Medicines:                Monitored Anesthesia Care Complications:            No immediate complications. Estimated Blood Loss:     Estimated blood loss was minimal. Procedure:                Pre-Anesthesia Assessment:                           - Prior to the procedure, a History and Physical                            was performed, and patient medications and                            allergies were reviewed. The patient's tolerance of                            previous anesthesia was also reviewed. The risks                            and benefits of the procedure and the sedation                            options and risks were discussed with the patient.  All questions were answered, and informed consent                            was obtained. Prior Anticoagulants: The patient has                            taken Eliquis (apixaban), last dose was 2 days                            prior to procedure. ASA Grade Assessment: III - A                            patient with severe systemic disease. After                             reviewing the risks and benefits, the patient was                            deemed in satisfactory condition to undergo the                            procedure.                           After obtaining informed consent, the endoscope was                            passed under direct vision. Throughout the                            procedure, the patient's blood pressure, pulse, and                            oxygen saturations were monitored continuously. The                            [id number]-H190 ([id number]) Olympus endoscope was introduced                            through the mouth, and advanced to the second part                            of duodenum. The upper GI endoscopy was somewhat                            difficult due to abnormal anatomy. The patient                            tolerated the procedure well. Scope In: Scope Out: Findings:      The lower third of the esophagus was moderately tortuous and       foreshortened with Schatzki's ring at the GE junction 33 cm from       incisors with luminal diameter approximately 12 mm.. The scope was  withdrawn. Dilation was performed with a Maloney dilator with mild       resistance at 52 Fr.      A large hiatal hernia was present which distorted the anatomy. Majority       of the stomach was in the chest. There was significant adherent food in       the stomach which would limit the examination. Aggressive suctioning       aspiration was performed. One small 6 mm nodule was visualized at the GE       junction towards the gastric side. Biopsies were taken with a cold       forceps for histology.      Diffuse moderate inflammation characterized by erythema with superficial       erosions was found in the gastric body. Biopsies were taken with a cold       forceps for histology. No active bleeding.      The examined duodenum was normal. Impression:               - Large hiatal hernia.                           - Schatzki's  ring s/p esophageal dilatation                           - Small GE junction nodule (biopsied)                           - Moderate gastritis (biopsied)                           - Retained adherent food limiting examination. No                            active bleeding. Recommendation:           - Return patient to hospital ward for ongoing care.                           - Full liquid diet. Advance as tolerated.                           - Continue present medications including Protonix                            40 mg twice daily.                           - Start Carafate 1 g BID x 14 days (lower dose due                            to ESRD)                           - Await pathology results.                           - Trend CBC.                           -  Resume Eliquis in 24 hours.                           - Note that she is at a higher risk for having                            intermittent gastric volvulus. If unable to                            tolerate p.o., would get CT Abdo/pelvis with                            contrast possibly followed by upper GI series if                            needed.                           - The findings and recommendations were discussed                            with the patient's family.                           - Will follow along. Procedure Code(s):        --- Professional ---                           [phone], Esophagogastroduodenoscopy, flexible,                            transoral; with biopsy, single or multiple                           43450, Dilation of esophagus, by unguided sound or                            bougie, single or multiple passes Diagnosis Code(s):        --- Professional ---                           Q39.9, Congenital malformation of esophagus,                            unspecified                           K44.9, Diaphragmatic hernia without obstruction or                            gangrene                            K29.70, Gastritis, unspecified, without bleeding                           K92.0, Hematemesis CPT copyright 2019  American Medical Association. All rights reserved. The codes documented in this report are preliminary and upon coder review may  be revised to meet current compliance requirements. Jackquline Denmark, MD 02/26/2021 11:11:44 AM This report has been signed electronically. Number of Addenda: 0

## 2021-02-26 NOTE — Anesthesia Postprocedure Evaluation (Signed)
Anesthesia Post Note  Patient: [name]  Procedure(s) Performed: ESOPHAGOGASTRODUODENOSCOPY (EGD) WITH PROPOFOL BIOPSY ESOPHAGEAL DILATION     Patient location during evaluation: Endoscopy Anesthesia Type: MAC Level of consciousness: awake and alert Pain management: pain level controlled Vital Signs Assessment: post-procedure vital signs reviewed and stable Respiratory status: spontaneous breathing, nonlabored ventilation, respiratory function stable and patient connected to nasal cannula oxygen Cardiovascular status: stable and blood pressure returned to baseline Postop Assessment: no apparent nausea or vomiting Anesthetic complications: no   No notable events documented.  Last Vitals:  Vitals:   02/26/21 1114 02/26/21 1120  BP: (!) 133/32 (!) 144/45  Pulse: 93 94  Resp: (!) 24 16  Temp:    SpO2: 94% 90%    Last Pain:  Vitals:   02/26/21 1120  TempSrc:   PainSc: 0-No pain                 Kabe Mckoy S

## 2021-02-27 DIAGNOSIS — K921 Melena: Secondary | ICD-10-CM | POA: Diagnosis not present

## 2021-02-27 LAB — CBC
HCT: 29 % — ABNORMAL LOW (ref 36.0–46.0)
HCT: 30.7 % — ABNORMAL LOW (ref 36.0–46.0)
Hemoglobin: 9.3 g/dL — ABNORMAL LOW (ref 12.0–15.0)
Hemoglobin: 10.1 g/dL — ABNORMAL LOW (ref 12.0–15.0)
MCH: 29.1 pg (ref 26.0–34.0)
MCH: 28.9 pg (ref 26.0–34.0)
MCHC: 32.1 g/dL (ref 30.0–36.0)
MCHC: 32.9 g/dL (ref 30.0–36.0)
MCV: 90.6 fL (ref 80.0–100.0)
MCV: 88 fL (ref 80.0–100.0)
Platelets: 333 10*3/uL (ref 150–400)
Platelets: 284 10*3/uL (ref 150–400)
RBC: 3.2 MIL/uL — ABNORMAL LOW (ref 3.87–5.11)
RBC: 3.49 MIL/uL — ABNORMAL LOW (ref 3.87–5.11)
RDW: 15.2 % (ref 11.5–15.5)
RDW: 15.1 % (ref 11.5–15.5)
WBC: 11.1 10*3/uL — ABNORMAL HIGH (ref 4.0–10.5)
WBC: 12.2 10*3/uL — ABNORMAL HIGH (ref 4.0–10.5)
nRBC: 0 % (ref 0.0–0.2)
nRBC: 0 % (ref 0.0–0.2)

## 2021-02-27 LAB — RENAL FUNCTION PANEL
Albumin: 2.1 g/dL — ABNORMAL LOW (ref 3.5–5.0)
Anion gap: 12 (ref 5–15)
BUN: 28 mg/dL — ABNORMAL HIGH (ref 8–23)
CO2: 27 mmol/L (ref 22–32)
Calcium: 8.7 mg/dL — ABNORMAL LOW (ref 8.9–10.3)
Chloride: 97 mmol/L — ABNORMAL LOW (ref 98–111)
Creatinine, Ser: 6.36 mg/dL — ABNORMAL HIGH (ref 0.44–1.00)
GFR, Estimated: 6 mL/min — ABNORMAL LOW (ref >60)
Glucose, Bld: 88 mg/dL (ref 70–99)
Phosphorus: 5.4 mg/dL — ABNORMAL HIGH (ref 2.5–4.6)
Potassium: 4.6 mmol/L (ref 3.5–5.1)
Sodium: 136 mmol/L (ref 135–145)

## 2021-02-27 LAB — SURGICAL PATHOLOGY

## 2021-02-27 MED ORDER — RENA-VITE PO TABS
1.0000 | ORAL_TABLET | Freq: Every day | ORAL | Status: DC
  Administered 2021-02-27: 1 via ORAL
  Filled 2021-02-27: qty 1

## 2021-02-27 MED ORDER — APIXABAN 2.5 MG PO TABS
2.5000 mg | ORAL_TABLET | Freq: Two times a day (BID) | ORAL | Status: DC
  Administered 2021-02-27 – 2021-02-28 (×2): 2.5 mg via ORAL
  Filled 2021-02-27 (×2): qty 1

## 2021-02-27 MED ORDER — PROSOURCE PLUS PO LIQD
30.0000 mL | Freq: Two times a day (BID) | ORAL | Status: DC
  Administered 2021-02-27 – 2021-02-28 (×3): 30 mL via ORAL
  Filled 2021-02-27: qty 30

## 2021-02-27 MED ORDER — NEPRO/CARBSTEADY PO LIQD
237.0000 mL | Freq: Two times a day (BID) | ORAL | Status: DC
  Administered 2021-02-27 – 2021-02-28 (×3): 237 mL via ORAL

## 2021-02-27 NOTE — Progress Notes (Signed)
Progress Note    ASSESSMENT AND PLAN:   UGI bleed d/t moderate gastritis with superficial erosions.  No further bleeding. Large hiatal hernia Schatzki's ring s/p esophageal dilatation Comorbid conditions include CAD, AF on Eliquis, ESRD on HD, OSA, COPD  Plan: -Can continue Protonix 40 mg p.o. twice daily -Carafate 1 g p.o. twice daily x 14 days -If recurrent N/V, UGI series as outpt.  I have reviewed CT Abdo/pelvis performed 04/2020 -Trend CBC -Can resume Eliquis from GI standpoint -Will sign off for now. -Please call if with any questions or change in clinical status.   SUBJECTIVE  Seen during hemodialysis No further bleeding. Tolerating p.o. No further dysphagia Denies having any abdominal pain. Hemoglobin has been stable   EGD 7/25 - Large hiatal hernia. - Schatzki's ring s/p esophageal dilatation - Small GE junction nodule (biopsied) - Moderate gastritis (biopsied) - Retained adherent food limiting examination. No active bleeding.  CT AP 04/2020 1. No acute findings in the abdomen or pelvis. 2. Large hiatal hernia. 3. Extensive colonic diverticulosis without evidence of acute diverticulitis. 4. Aortic atherosclerosis. (ICD10-I70.0).   OBJECTIVE:     Vital signs in last 24 hours: Temp:  [97.3 F (36.3 C)-98.5 F (36.9 C)] 98.3 F (36.8 C) (07/26 0811) Pulse Rate:  [71-100] 73 (07/26 1000) Resp:  [16-27] 16 (07/26 1000) BP: (74-144)/(20-98) 110/41 (07/26 1000) SpO2:  [90 %-99 %] 92 % (07/26 0619) Weight:  [73.1 kg-74 kg] 74 kg (07/26 0800) Last BM Date: 02/24/21 General:   Alert, well-developed female in NAD Abdomen:  Soft, nondistended, nontender.  Normal bowel sounds,.       Neurologic:  Alert and  oriented x4;  grossly normal neurologically. Psych:  Pleasant, cooperative.  Normal mood and affect.   Intake/Output from previous day: 07/25 0701 - 07/26 0700 In: 284 [P.O.:715; I.V.:50] Out: 0  Intake/Output this shift: No intake/output  data recorded.  Lab Results: Recent Labs    02/25/21 0914 02/25/21 1909 02/26/21 0500 02/26/21 1410 02/27/21 0354  WBC 8.7  --  8.5  --  11.1*  HGB 10.2*   < > 10.1* 10.2* 9.3*  HCT 32.3*   < > 31.4* 32.5* 29.0*  PLT 352  --  255  --  333   < > = values in this interval not displayed.   BMET Recent Labs    02/25/21 0914 02/26/21 0500 02/27/21 0354  NA 137 135 136  K 3.8 4.2 4.6  CL 97* 96* 97*  CO2 28 27 27   GLUCOSE 109* 92 88  BUN 11 19 28*  CREATININE 3.58* 4.99* 6.36*  CALCIUM 9.2 8.8* 8.7*   LFT Recent Labs    02/25/21 0914 02/26/21 0500 02/27/21 0354  PROT 6.3*  --   --   ALBUMIN 2.4*   < > 2.1*  AST 13*  --   --   ALT 9  --   --   ALKPHOS 85  --   --   BILITOT 0.6  --   --    < > = values in this interval not displayed.   PT/INR No results for input(s): LABPROT, INR in the last 72 hours. Hepatitis Panel No results for input(s): HEPBSAG, HCVAB, HEPAIGM, HEPBIGM in the last 72 hours.  No results found.   Principal Problem:   GI bleed Active Problems:   DM type 2 with diabetic peripheral neuropathy (HCC)   OSA (obstructive sleep apnea)   HLD (hyperlipidemia)   Hiatal hernia with GERD and  esophagitis   Dark stools   Symptomatic anemia   Chronic respiratory failure with hypoxia (HCC)   Acute blood loss anemia   Hypotension     LOS: 1 day     Carmell Austria, MD 02/27/2021, 10:24 AM [facility] [phone]

## 2021-02-27 NOTE — Progress Notes (Signed)
Pt refusing CPAP. States she has difficulty getting our mask to fit her correctly, and isn't going to use machine at this time. Order being discontinued at this time per RT protocol, but pt aware she can request CPAP/BIPAP at any time, and machine will be set up for her.

## 2021-02-27 NOTE — Progress Notes (Signed)
[facility] Progress Note  Subjective: seen on HD no new c/o  Vitals:   02/27/21 1030 02/27/21 1100 02/27/21 1120 02/27/21 1135  BP: (!) 123/44 (!) 121/43 (!) 137/53 (!) 119/42  Pulse: 71     Resp: 15 14 11 15   Temp:   97.8 F (36.6 C) 97.8 F (36.6 C)  TempSrc:   Oral Oral  SpO2:      Weight:    73.7 kg  Height:        Exam:  alert, nad   no jvd  Chest cta bilat  Cor reg no RG  Abd soft ntnd no ascites   Ext no LE edema   Alert, NF, ox3   LIJ TDC, maturing RUA AVF     OP HD: TTS NW  4h  74kg  2/2.5 bath   P4  TDC (R 2nd stage BB AVF 01/15/21)  Hep none   - mircera 100 q2, last 7/14, due 7/28  - venofer 100 x 10  - calcitriol 1.25 tiw po     Assessment/ Plan: GIB - w/ CG emesis, N/V. EGD showed gastritis w/ superficial erosions.  ESRD: on HD x 2 years. HD today.  Hypotension - c/o low BP's at OP HD. Is on midodrine 10 tid.  Volume - no vol excess on exam, at dry wt. Min UF w/ HD.  Anemia ckd  - hgb 10.9 here, follow.  MBD ckd: cont binder, Ca okay COPD/ OSA Afib on eliquis Gout DM2   [name] [name] 02/27/2021, 2:08 PM   Recent Labs  Lab 02/26/21 0500 02/26/21 1410 02/27/21 0354  K 4.2  --  4.6  BUN 19  --  28*  CREATININE 4.99*  --  6.36*  CALCIUM 8.8*  --  8.7*  PHOS 4.8*  --  5.4*  HGB 10.1* 10.2* 9.3*   Inpatient medications:  (feeding supplement) PROSource Plus  30 mL Oral BID BM   allopurinol  100 mg Oral BID   amiodarone  200 mg Oral q AM   apixaban  2.5 mg Oral BID   atorvastatin  40 mg Oral Q1500   Chlorhexidine Gluconate Cloth  6 each Topical Q0600   feeding supplement (NEPRO CARB STEADY)  237 mL Oral BID BM   ferric citrate  210 mg Oral TID WC   fluticasone  1 spray Each Nare Daily   fluticasone furoate-vilanterol  1 puff Inhalation Daily   And   umeclidinium bromide  1 puff Inhalation Daily   gabapentin  100 mg Oral QHS   midodrine  10 mg Oral TID WC   multivitamin  1 tablet Oral QHS   pantoprazole (PROTONIX) IV   40 mg Intravenous Q12H   sodium chloride flush  3 mL Intravenous Q12H   sucralfate  1 g Oral BID    acetaminophen **OR** acetaminophen, albuterol, HYDROmorphone (DILAUDID) injection, melatonin, ondansetron **OR** ondansetron (ZOFRAN) IV, oxyCODONE-acetaminophen, polyvinyl alcohol

## 2021-02-27 NOTE — Progress Notes (Signed)
Initial Nutrition Assessment  DOCUMENTATION CODES:   Obesity unspecified  INTERVENTION:  -Nepro Shake po BID, each supplement provides 425 kcal and 19 grams protein -PROSource PLUS PO 75mls BID, each supplement provides 100 kcals and 15 grams of protein -renal mvi daily  NUTRITION DIAGNOSIS:   Inadequate oral intake related to nausea, vomiting as evidenced by per patient/family report.  GOAL:   Patient will meet greater than or equal to 90% of their needs  MONITOR:   PO intake, Supplement acceptance, Diet advancement, Weight trends, Labs, I & O's  REASON FOR ASSESSMENT:   Malnutrition Screening Tool    ASSESSMENT:   Pt with PMH including ESRD on HD, COPD, type 2 DM, CHF, HTN, PAF, PNA, and OSA presented with N/V and coffee-ground emesis. Stools were guaiac positive. GI consulted.  Pt underwent EGD yesterday, no evidence of active bleeding seen but large hiatal hernia observed; pt underwent dilation for Schatzki's ring.  Pt unavailable at time of RD visit. Pt in HD. Discussed pt with RN who reports pt is not experiencing N/V today and has not observed blood in stool. Pt on full liquid diet at this time. Will provide oral nutrition supplements.   No PO intake documented.   No UOP documented.   EDW 74 kg Current weight: 73.1 kg Admit weight: 73.1 kg   Medications: auryxia, protonix, carafate Labs: Cr 6.36 (H, up from yesterday), PO4 5.4 (H), Hgb 9.3 (L, down from yesterday) Diet Order:   Diet Order             Diet full liquid Room service appropriate? Yes; Fluid consistency: Thin  Diet effective now                   EDUCATION NEEDS:   No education needs have been identified at this time  Skin:  Skin Assessment: Reviewed RN Assessment  Last BM:  7/23  Height:   Ht Readings from Last 1 Encounters:  02/26/21 4\' 10"  (1.473 m)    Weight:   Wt Readings from Last 10 Encounters:  02/27/21 74 kg  02/06/21 72.1 kg  01/15/21 72.1 kg  11/29/20 72.1 kg   11/22/20 72.1 kg  10/23/20 72.1 kg  09/11/20 71.9 kg  09/08/20 71.9 kg  09/05/20 72.1 kg  07/27/20 72.2 kg   BMI:  Body mass index is 34.1 kg/m.  Estimated Nutritional Needs:   Kcal:  1900-2100  Protein:  95-105 grams  Fluid:  1L + UOP    Larkin Ina, MS, RD, LDN (she/her/hers) RD pager number and weekend/on-call pager number located in [place].

## 2021-02-27 NOTE — Progress Notes (Signed)
[facility] Progress Note  Patient: [name]    EYC:[id number]  DOA: 02/25/2021     Date of Service: the patient was seen and examined on 02/27/2021  Brief hospital course: Past medical history of ESRD on HD, HTN, HLD, PAF on Eliquis, GERD, OSA on BiPAP, recent COVID-19.  Presents with complaints of coffee colored emesis.  Nausea and vomiting. Underwent EGD on 7/25.  No evidence of active bleeding but large hiatal hernia.  Underwent dilation for Schatzki's ring.  Currently plan is Await 24 hours before resuming anticoagulation.  Subjective: No bleeding.  Has not had anything to eat so far.  No nausea no vomiting.  No abdominal pain.  No fever no chills.  Assessment and Plan: 1.  Hematemesis Intractable nausea and vomiting. Large hiatal hernia  Gastritis Schatzki's ring s/p esophageal dilatation Underwent EGD on 7/25. Currently on PPI twice daily. Starting Carafate 1 g twice daily for 14 days. Resume Eliquis today. High risk for gastric volvulus per GI.  Currently on full liquid diet.  Will advance to soft diet. Monitor CBC after initiation of the Eliquis as the patient actually has evidence of gastritis on endoscopy.  And high risk for bleeding. From chart review patient is also on 81 mg aspirin which cardiology actually has recommended to stop taking in the past.  Patient also does not have any high risk vascular disease for which she should be on Eliquis and aspirin together. To stop taking aspirin.  2.  ESRD on HD TTS at [place] [facility] input pretension continue hemodialysis.  Likely can go home on 7/27 as long as remains stable.  3.  Paroxysmal A. fib on chronic anticoagulation Currently rate controlled with amiodarone. On Eliquis resumed on 7/26. Monitor on telemetry. Resume tomorrow.  4.  Chronic respiratory failure COPD OSA On 2 LPM oxygen at home as well as BiPAP nightly. Continue oxygen as needed and inhalers. Does not appear to have any  exacerbation. BiPAP nightly.  5.  Type II DM, uncontrolled with hyperglycemia with peripheral neuropathy without any long-term insulin use. Currently on sliding scale insulin. On gabapentin.  Monitor.  6.  Hypotension, chronic On midodrine. Monitor.  7.  HLD. Continue current medication.  Scheduled Meds:  (feeding supplement) PROSource Plus  30 mL Oral BID BM   allopurinol  100 mg Oral BID   amiodarone  200 mg Oral q AM   apixaban  2.5 mg Oral BID   atorvastatin  40 mg Oral Q1500   Chlorhexidine Gluconate Cloth  6 each Topical Q0600   feeding supplement (NEPRO CARB STEADY)  237 mL Oral BID BM   ferric citrate  210 mg Oral TID WC   fluticasone  1 spray Each Nare Daily   fluticasone furoate-vilanterol  1 puff Inhalation Daily   And   umeclidinium bromide  1 puff Inhalation Daily   gabapentin  100 mg Oral QHS   midodrine  10 mg Oral TID WC   multivitamin  1 tablet Oral QHS   pantoprazole (PROTONIX) IV  40 mg Intravenous Q12H   sodium chloride flush  3 mL Intravenous Q12H   sucralfate  1 g Oral BID   Continuous Infusions: PRN Meds: acetaminophen **OR** acetaminophen, albuterol, HYDROmorphone (DILAUDID) injection, melatonin, ondansetron **OR** ondansetron (ZOFRAN) IV, oxyCODONE-acetaminophen, polyvinyl alcohol  Body mass index is 33.96 kg/m.  Nutrition Problem: Inadequate oral intake Etiology: nausea, vomiting     DVT Prophylaxis:   apixaban (ELIQUIS) tablet 2.5 mg Start: 02/27/21 1045 SCDs Start: 02/25/21 1909  apixaban (ELIQUIS) tablet 2.5 mg    Advance goals of care discussion: Pt is Full code.  Family Communication: no family was present at bedside, at the time of interview.   Data Reviewed: I have personally reviewed and interpreted daily labs, tele strips, imaging. Hemoglobin dropped from around 10-9.3 this morning.  Recheck in the afternoon is 10.1.  WBC elevated.  Physical Exam:  General: Appear in mild distress, no Rash; Oral Mucosa Clear, moist. no  Abnormal Neck Mass Or lumps, Conjunctiva normal  Cardiovascular: S1 and S2 Present, no Murmur, Respiratory: good respiratory effort, Bilateral Air entry present and CTA, no Crackles, no wheezes Abdomen: Bowel Sound present, Soft and no tenderness Extremities: no Pedal edema Neurology: alert and oriented to time, place, and person affect appropriate. no new focal deficit Gait not checked due to patient safety concerns   Vitals:   02/27/21 1030 02/27/21 1100 02/27/21 1120 02/27/21 1135  BP: (!) 123/44 (!) 121/43 (!) 137/53 (!) 119/42  Pulse: 71     Resp: 15 14 11 15   Temp:   97.8 F (36.6 C) 97.8 F (36.6 C)  TempSrc:   Oral Oral  SpO2:      Weight:    73.7 kg  Height:        Disposition:  Status is: Observation  The patient will require care spanning > 2 midnights and should be moved to inpatient because: Ongoing diagnostic testing needed not appropriate for outpatient work up  Dispo: The patient is from: Home              Anticipated d/c is to: Home              Patient currently is not medically stable to d/c.   Difficult to place patient No        Time spent: 35 minutes. I reviewed all nursing notes, pharmacy notes, vitals, pertinent old records. I have discussed plan of care as described above with RN.  Author: Berle Mull, MD [facility] 02/27/2021 6:07 PM  To reach On-call, see care teams to locate the attending and reach out via [email or web address]. Between 7PM-7AM, please contact night-coverage If you still have difficulty reaching the attending provider, please page the [facility] (Director on Call) for [facility] on amion for assistance.

## 2021-02-27 NOTE — Plan of Care (Signed)
  Problem: Education: Goal: Ability to identify signs and symptoms of gastrointestinal bleeding will improve Outcome: Progressing   Problem: Bowel/Gastric: Goal: Will show no signs and symptoms of gastrointestinal bleeding Outcome: Progressing   Problem: Nutrition: Goal: Adequate nutrition will be maintained Outcome: Progressing   Problem: Coping: Goal: Level of anxiety will decrease Outcome: Progressing

## 2021-02-28 LAB — CBC
HCT: 29.3 % — ABNORMAL LOW (ref 36.0–46.0)
Hemoglobin: 9.4 g/dL — ABNORMAL LOW (ref 12.0–15.0)
MCH: 29.2 pg (ref 26.0–34.0)
MCHC: 32.1 g/dL (ref 30.0–36.0)
MCV: 91 fL (ref 80.0–100.0)
Platelets: 270 10*3/uL (ref 150–400)
RBC: 3.22 MIL/uL — ABNORMAL LOW (ref 3.87–5.11)
RDW: 15.3 % (ref 11.5–15.5)
WBC: 11.7 10*3/uL — ABNORMAL HIGH (ref 4.0–10.5)
nRBC: 0 % (ref 0.0–0.2)

## 2021-02-28 LAB — RENAL FUNCTION PANEL
Albumin: 2.1 g/dL — ABNORMAL LOW (ref 3.5–5.0)
Anion gap: 10 (ref 5–15)
BUN: 13 mg/dL (ref 8–23)
CO2: 27 mmol/L (ref 22–32)
Calcium: 8.6 mg/dL — ABNORMAL LOW (ref 8.9–10.3)
Chloride: 98 mmol/L (ref 98–111)
Creatinine, Ser: 4.27 mg/dL — ABNORMAL HIGH (ref 0.44–1.00)
GFR, Estimated: 10 mL/min — ABNORMAL LOW (ref >60)
Glucose, Bld: 72 mg/dL (ref 70–99)
Phosphorus: 4 mg/dL (ref 2.5–4.6)
Potassium: 4.1 mmol/L (ref 3.5–5.1)
Sodium: 135 mmol/L (ref 135–145)

## 2021-02-28 MED ORDER — ESOMEPRAZOLE MAGNESIUM 40 MG PO CPDR: 40.0000 mg | DELAYED_RELEASE_CAPSULE | Freq: Two times a day (BID) | ORAL | 0 refills | Status: DC

## 2021-02-28 MED ORDER — NEPRO/CARBSTEADY PO LIQD: 237.0000 mL | Freq: Two times a day (BID) | ORAL | 0 refills | Status: DC

## 2021-02-28 MED ORDER — SUCRALFATE 1 GM/10ML PO SUSP: 1.0000 g | Freq: Two times a day (BID) | ORAL | 0 refills | Status: DC

## 2021-02-28 NOTE — Plan of Care (Signed)
  Problem: Education: Goal: Ability to identify signs and symptoms of gastrointestinal bleeding will improve Outcome: Progressing   Problem: Fluid Volume: Goal: Will show no signs and symptoms of excessive bleeding Outcome: Progressing   Problem: Health Behavior/Discharge Planning: Goal: Ability to manage health-related needs will improve Outcome: Progressing   Problem: Clinical Measurements: Goal: Diagnostic test results will improve Outcome: Progressing

## 2021-02-28 NOTE — Progress Notes (Signed)
DISCHARGE NOTE HOME DELMAR DONDERO to be discharged Home per MD order. Discussed prescriptions and follow up appointments with the patient. Prescriptions given to patient; medication list explained in detail. Patient verbalized understanding.  Skin clean, dry and intact without evidence of skin break down, no evidence of skin tears noted. IV catheter discontinued intact. Site without signs and symptoms of complications. Dressing and pressure applied. Pt denies pain at the site currently. No complaints noted.  Patient free of lines, drains, and wounds.   An After Visit Summary (AVS) was printed and given to the patient. Patient escorted via wheelchair, and discharged home via private auto.  Mikki Santee, RN

## 2021-02-28 NOTE — Discharge Instructions (Signed)

## 2021-02-28 NOTE — Evaluation (Signed)
Physical Therapy Evaluation Patient Details Name: [name] MRN: [id number] DOB: 04-Nov-1945 Today's Date: 02/28/2021   History of Present Illness  [name] is a 75 y.o. female who presents with complaints of nausea and vomiting. with medical history significant of ESRD on HD, HTN, HLD, PAF on Eliquis, GERD with esophagitis, OSA on BiPAP, colon polyps GERD, and prior history of COVID-19 pneumonia where hospital course complicated by upper GI bleed with coffee-ground emesis  Clinical Impression  Patient evaluated by Physical Therapy with no further acute PT needs identified, as pt is to dc home today; All education has been completed and the patient has no further questions.  See below for any follow-up Physical Therapy or equipment needs. PT is signing off. Thank you for this referral.     Follow Up Recommendations No PT follow up    Equipment Recommendations  Other (comment) (tub transfer bench)    Recommendations for Other Services       Precautions / Restrictions Precautions Precautions: None      Mobility  Bed Mobility Overal bed mobility: Modified Independent             General bed mobility comments: HOB elevated, incr time    Transfers Overall transfer level: Modified independent Equipment used: Rolling walker (2 wheeled)             General transfer comment: Good demo of hand placement  Ambulation/Gait Ambulation/Gait assistance: Min Gaffer (Feet): 120 Feet Assistive device: Rolling walker (2 wheeled) Gait Pattern/deviations: Step-through pattern;Decreased step length - right;Decreased step length - left;Trunk flexed Gait velocity: sloeed   General Gait Details: minguard assist progressing to supervision; managinghouehold distance well; stooped posture, pt indicates this is her baseline  Financial trader Rankin (Stroke Patients Only)       Balance Overall balance  assessment: Mild deficits observed, not formally tested                                           Pertinent Vitals/Pain Pain Assessment: Faces Faces Pain Scale: Hurts a little bit Pain Location: back stiffness, previous surgery Pain Descriptors / Indicators: Aching Pain Intervention(s): Monitored during session    Home Living Family/patient expects to be discharged to:: Private residence Living Arrangements: Children Available Help at Discharge: Family;Available 24 hours/day Type of Home: House Home Access: Level entry     Home Layout: One level Home Equipment: Walker - 2 wheels;Walker - 4 wheels Additional Comments: uses 2L O2 when needed    Prior Function Level of Independence: Needs assistance         Comments: reports a few falls, son takes her to dialysis, however she still drives     Hand Dominance   Dominant Hand: Right    Extremity/Trunk Assessment   Upper Extremity Assessment Upper Extremity Assessment: Overall WFL for tasks assessed (for simple tasks)    Lower Extremity Assessment Lower Extremity Assessment: Overall WFL for tasks assessed (for simple mobility household distance)    Cervical / Trunk Assessment Cervical / Trunk Assessment: Other exceptions Cervical / Trunk Exceptions: previous back surgery, chronically walks with slight stoop  Communication   Communication: No difficulties  Cognition Arousal/Alertness: Awake/alert Behavior During Therapy: WFL for tasks assessed/performed Overall Cognitive Status: Within Functional Limits for tasks assessed  General Comments General comments (skin integrity, edema, etc.): Pt spoke of losing her husband, became tearful    Exercises     Assessment/Plan    PT Assessment Patent does not need any further PT services  PT Problem List         PT Treatment Interventions      PT Goals (Current goals can be found in the Care  Plan section)  Acute Rehab PT Goals Patient Stated Goal: Hopes to be home soon PT Goal Formulation: All assessment and education complete, DC therapy    Frequency     Barriers to discharge        Co-evaluation               AM-PAC PT "6 Clicks" Mobility  Outcome Measure Help needed turning from your back to your side while in a flat bed without using bedrails?: None Help needed moving from lying on your back to sitting on the side of a flat bed without using bedrails?: None Help needed moving to and from a bed to a chair (including a wheelchair)?: None Help needed standing up from a chair using your arms (e.g., wheelchair or bedside chair)?: None Help needed to walk in hospital room?: A Little Help needed climbing 3-5 steps with a railing? : A Little 6 Click Score: 22    End of Session   Activity Tolerance: Patient tolerated treatment well Patient left: in chair;with call bell/phone within reach Nurse Communication: Mobility status PT Visit Diagnosis: Other abnormalities of gait and mobility (R26.89)    Time: 6160-7371 PT Time Calculation (min) (ACUTE ONLY): 21 min   Charges:   PT Evaluation $PT Eval Low Complexity: [place], PT  Acute Rehabilitation Services Pager [phone] Office [place] 02/28/2021, 4:46 PM

## 2021-02-28 NOTE — Progress Notes (Signed)
Physical Therapy Note  PT eval complete with full note to follow;  Recommend Home for transition out of hospital;  Lives with her son, who can provide at or very near 24hr assist;  No need for HHPT noted;  Recommend tub transfer bench;  Will follow acutely;   Roney [place], [place]  Acute Rehabilitation Services Pager [phone] Office [phone]

## 2021-02-28 NOTE — Progress Notes (Signed)
[facility] Progress Note  Subjective: no new c/o  Vitals:   02/27/21 1700 02/27/21 2137 02/28/21 0530 02/28/21 1010  BP: (!) 121/43 (!) 132/42 (!) 130/46 (!) 116/44  Pulse:  78 78 79  Resp: 16 18 18 18   Temp: 97.8 F (36.6 C) 98.6 F (37 C) 98.2 F (36.8 C) 98.2 F (36.8 C)  TempSrc: Oral Oral Oral Oral  SpO2:  93% 92% 92%  Weight:      Height:        Exam:  alert, nad   no jvd  Chest cta bilat  Cor reg no RG  Abd soft ntnd no ascites   Ext no LE edema   Alert, NF, ox3   LIJ TDC, maturing RUA AVF     OP HD: TTS NW  4h  74kg  2/2.5 bath   P4  TDC (R 2nd stage BB AVF 01/15/21)  Hep none   - mircera 100 q2, last 7/14, due 7/28  - venofer 100 x 10  - calcitriol 1.25 tiw po     Assessment/ Plan: GIB - w/ CG emesis, N/V. EGD 7/25 showed gastritis w/ superficial erosions and large hiatal hernia at risk for volvulus. Per GI/ pmd.   ESRD: on HD x 2 years. Next HD tomorrow.  Hypotension - pt c/o low BP's at OP HD. Is on midodrine 10 tid here as at home  Volume - no vol excess on exam, at dry wt. Min UF w/ HD.  Anemia ckd  - hgb 10.9 here, follow.  MBD ckd: cont binder, Ca okay COPD/ OSA Afib on eliquis Gout DM2   Rob Lakendrick Paradis 02/28/2021, 12:12 PM   Recent Labs  Lab 02/27/21 0354 02/27/21 1642 02/28/21 0335  K 4.6  --  4.1  BUN 28*  --  13  CREATININE 6.36*  --  4.27*  CALCIUM 8.7*  --  8.6*  PHOS 5.4*  --  4.0  HGB 9.3* 10.1* 9.4*    Inpatient medications:  (feeding supplement) PROSource Plus  30 mL Oral BID BM   allopurinol  100 mg Oral BID   amiodarone  200 mg Oral q AM   apixaban  2.5 mg Oral BID   atorvastatin  40 mg Oral Q1500   Chlorhexidine Gluconate Cloth  6 each Topical Q0600   feeding supplement (NEPRO CARB STEADY)  237 mL Oral BID BM   ferric citrate  210 mg Oral TID WC   fluticasone  1 spray Each Nare Daily   fluticasone furoate-vilanterol  1 puff Inhalation Daily   And   umeclidinium bromide  1 puff Inhalation Daily    gabapentin  100 mg Oral QHS   midodrine  10 mg Oral TID WC   multivitamin  1 tablet Oral QHS   pantoprazole (PROTONIX) IV  40 mg Intravenous Q12H   sodium chloride flush  3 mL Intravenous Q12H   sucralfate  1 g Oral BID    acetaminophen **OR** acetaminophen, albuterol, HYDROmorphone (DILAUDID) injection, melatonin, ondansetron **OR** ondansetron (ZOFRAN) IV, oxyCODONE-acetaminophen, polyvinyl alcohol

## 2021-03-01 ENCOUNTER — Telehealth: Payer: Self-pay | Admitting: Nephrology

## 2021-03-01 ENCOUNTER — Encounter (HOSPITAL_COMMUNITY): Payer: Self-pay | Admitting: Gastroenterology

## 2021-03-01 NOTE — Telephone Encounter (Signed)
Transition of care contact from inpatient facility  Date of Discharge:  02/28/21 Date of Contact: 03/01/21 Method of contact: Phone  Attempted to contact patient to discuss transition of care from inpatient admission. Patient did not answer the phone. Message was left on the patient's voicemail with call back number [phone].

## 2021-03-02 ENCOUNTER — Telehealth (HOSPITAL_COMMUNITY): Payer: Self-pay | Admitting: Nephrology

## 2021-03-02 NOTE — Telephone Encounter (Signed)
Transition of care contact from inpatient facility  Date of discharge: 7/27 Date of contact: 03/02/21 Method: Phone Spoke to: Patient  Patient contacted to discuss transition of care from recent inpatient hospitalization. Patient was admitted to [facility] from 7/24-7/27/22 with discharge diagnosis of acute GIB, nausea  Medication changes were reviewed. Nausea still an issue - takes something for it without improvement. Told to ask her dialysis provider about alternative med for her. Also, still noticing a small about of blood in stool.  Patient will follow up with his/her outpatient HD unit on: tomorrow. Reviewed OP HD records, got below EDW -> lowered.  Veneta Penton, PA-C [employer] Pager [phone]

## 2021-03-04 NOTE — Discharge Summary (Signed)
[facility] Discharge Summary   Patient: [name] DTO:[id number]  PCP: [name], MD  Date of admission: 02/25/2021   Date of discharge: 02/28/2021     Discharge Diagnoses:  Principal Problem:   GI bleed Active Problems:   DM type 2 with diabetic peripheral neuropathy (HCC)   OSA (obstructive sleep apnea)   HLD (hyperlipidemia)   Hiatal hernia with GERD and esophagitis   Dark stools   Symptomatic anemia   Chronic respiratory failure with hypoxia ([place])   Acute blood loss anemia   Hypotension   Admitted From: Home Disposition:  Home   Recommendations for Outpatient Follow-up:  PCP: Follow-up with PCP with repeat CBC.   Follow-up Information     [name], MD. Schedule an appointment as soon as possible for a visit in 1 week(s).   Specialty: Family Medicine Contact information: [place] [address] [phone]                Discharge Instructions     Diet - low sodium heart healthy   Complete by: As directed    Increase activity slowly   Complete by: As directed        Diet recommendation: Renal diet  Activity: The patient is advised to gradually reintroduce usual activities, as tolerated  Discharge Condition: stable  Code Status: Full code   History of present illness: As per the H and P dictated on admission, "[name] is a 75 y.o. female with medical history significant of ESRD on HD, HTN, HLD, PAF on Eliquis, GERD with esophagitis, OSA on BiPAP, colon polyps GERD, and prior history of COVID-19 pneumonia where hospital course complicated by upper GI bleed with coffee-ground emesis presents with complaints of nausea and vomiting.  She had gone to hemodialysis yesterday and complained of feeling nauseous during her session which had to be cut short due to low blood pressures.  There after getting home she usually can drink something and feel better, but reports having several episodes of nonbloody  emesis.  Upon further evaluation patient reports that over the last several weeks she has had right-sided abdominal pain.  Sometimes pain would be dull/achy and sometimes sharp and stabbing.  In addition to this she reports that she has been having intermittent dark schools and some with what appeared to be fresh blood present.  She had tried to move up her appointment with gastroenterology but had had no blood.  Her last bowel movement was yesterday morning and that is also the last time she took Eliquis.  Patient has had recent EGD and [facility] 2020   ED Course: Upon admission into the emergency department patient was seen to be afebrile, respiration 15-25, blood pressures 108/56-151/64, and O2 saturations maintained.  Labs are significant for hemoglobin 10.9, potassium 3.8, BUN 11, creatinine 3.58.  Influenza and COVID-19 screening was negative.  Stool guaiacs were noted to be positive.  Patient was typed and screened for possible need of blood products and gastroenterology has been formally consulted.  [facility] called to admit."  Hospital Course:  Summary of her active problems in the hospital is as following. 1.  Hematemesis Intractable nausea and vomiting. Large hiatal hernia Gastritis Schatzki's ring s/p esophageal dilatation Underwent EGD on 7/25. Currently on PPI twice daily. Starting Carafate 1 g twice daily for 14 days. Resume Eliquis today. High risk for gastric volvulus per GI.  Currently on full liquid diet.  Will advance to soft diet. Monitor CBC  after initiation of the Eliquis as the patient actually has evidence of gastritis on endoscopy.  And high risk for bleeding. From chart review patient is also on 81 mg aspirin which cardiology actually has recommended to stop taking in the past.  Patient also does not have any high risk vascular disease for which she should be on Eliquis and aspirin together. To stop taking aspirin.   2.  ESRD on HD TTS at [facility] nephrology input  pretension continue hemodialysis.    3.  Paroxysmal A. fib on chronic anticoagulation Currently rate controlled with amiodarone. On Eliquis resumed on 7/26. No bleeding reported.  Symptoms are relatively stable.   4.  Chronic respiratory failure COPD OSA On 2 LPM oxygen at home as well as BiPAP nightly. Continue oxygen as needed and inhalers. Does not appear to have any exacerbation. BiPAP nightly.   5.  Type II DM, uncontrolled with hyperglycemia with peripheral neuropathy without any long-term insulin use. Currently on sliding scale insulin. On gabapentin.  Monitor.   6.  Hypotension, chronic On midodrine. Monitor.   7.  HLD. Continue current medication.  8.  Obesity. Placing the patient at high risk for poor outcome. On BiPAP for OSA. Body mass index is 33.96 kg/m.  Nutrition Problem: Inadequate oral intake Etiology: nausea, vomiting Nutrition Interventions: Interventions: Nepro shake, Prostat, MVI  Patient was ambulatory without any assistance. On the day of the discharge the patient's vitals were stable, and no other new acute medical condition were reported. The patient was felt safe to be discharge at Home with no therapy needed on discharge.  Consultants: Nephrology Gastroenterology  Procedures: Upper GI endoscopy.  Hemodialysis.  DISCHARGE MEDICATION: Allergies as of 02/28/2021   No Known Allergies      Medication List     STOP taking these medications    aspirin EC 81 MG tablet   omeprazole 40 MG capsule Commonly known as: PRILOSEC       TAKE these medications    acetaminophen 325 MG tablet Commonly known as: TYLENOL Take 650 mg by mouth daily as needed for headache (pain).   albuterol (2.5 MG/3ML) 0.083% nebulizer solution Commonly known as: PROVENTIL Take 2.5 mg by nebulization 4 (four) times daily as needed for wheezing or shortness of breath.   allopurinol 100 MG tablet Commonly known as: ZYLOPRIM Take 1 tablet (100 mg total) by  mouth 2 (two) times daily.   amiodarone 200 MG tablet Commonly known as: PACERONE Take 1 tablet (200 mg total) by mouth in the morning.   apixaban 2.5 MG Tabs tablet Commonly known as: ELIQUIS Take 2.5 mg by mouth 2 (two) times daily.   atorvastatin 40 MG tablet Commonly known as: LIPITOR Take 40 mg by mouth daily in the afternoon. In the afternoon   Biotin 5000 MCG Tabs Take 5,000 mcg by mouth in the morning.   Clear Eyes for Dry Eyes 1-0.25 % Soln Generic drug: Carboxymethylcellul-Glycerin Place 1 drop into both eyes daily as needed (dry eys).   esomeprazole 40 MG capsule Commonly known as: NEXIUM Take 1 capsule (40 mg total) by mouth 2 (two) times daily before a meal. What changed: when to take this   famotidine 20 MG tablet Commonly known as: PEPCID Take 20 mg by mouth every evening.   feeding supplement (NEPRO CARB STEADY) Liqd Take 237 mLs by mouth 2 (two) times daily between meals.   ferric citrate 1 GM 210 MG(Fe) tablet Commonly known as: AURYXIA Take 210 mg by mouth 3 (  three) times daily with meals.   fluticasone 50 MCG/ACT nasal spray Commonly known as: FLONASE Place 1 spray into both nostrils daily.   gabapentin 100 MG capsule Commonly known as: NEURONTIN Take 100 mg by mouth at bedtime.   midodrine 10 MG tablet Commonly known as: PROAMATINE Take 1 tablet (10 mg total) by mouth 3 (three) times daily with meals.   nitroGLYCERIN 0.4 MG SL tablet Commonly known as: NITROSTAT Place 0.4 mg under the tongue every 5 (five) minutes x 3 doses as needed for chest pain.   ondansetron 4 MG tablet Commonly known as: ZOFRAN Take 4 mg by mouth every 8 (eight) hours as needed for nausea/vomiting, nausea or vomiting.   oxyCODONE-acetaminophen 5-325 MG tablet Commonly known as: Percocet Take 1 tablet by mouth every 6 (six) hours as needed for severe pain.   OXYGEN Inhale 2 L into the lungs See admin instructions. Use every night and as needed during the day    PRESCRIPTION MEDICATION Inhale into the lungs at bedtime. Bipap   Salonpas 3.08-10-08 % Ptch Generic drug: Camphor-Menthol-Methyl Sal Place 1 patch onto the skin daily as needed (pain).   sucralfate 1 GM/10ML suspension Commonly known as: CARAFATE Take 10 mLs (1 g total) by mouth 2 (two) times daily for 14 days.   Trelegy Ellipta 100-62.5-25 MCG/INH Aepb Generic drug: Fluticasone-Umeclidin-Vilant Inhale 1 puff into the lungs daily at 12 noon.   Vitamin D3 50 MCG (2000 UT) Tabs Take 2,000 Units by mouth in the morning.        Discharge Exam: Filed Weights   02/26/21 1343 02/27/21 0800 02/27/21 1135  Weight: 73.1 kg 74 kg 73.7 kg   Vitals:   02/28/21 0530 02/28/21 1010  BP: (!) 130/46 (!) 116/44  Pulse: 78 79  Resp: 18 18  Temp: 98.2 F (36.8 C) 98.2 F (36.8 C)  SpO2: 92% 92%   General: Appear in mild distress, no Rash; Oral Mucosa Clear, moist. no Abnormal Neck Mass Or lumps, Conjunctiva normal  Cardiovascular: S1 and S2 Present, no Murmur, Respiratory: good respiratory effort, Bilateral Air entry present and CTA, no Crackles, no wheezes Abdomen: Bowel Sound present, Soft and no tenderness Extremities: no Pedal edema Neurology: alert and oriented to time, place, and person affect appropriate. no new focal deficit Gait not checked due to patient safety concerns  The results of significant diagnostics from this hospitalization (including imaging, microbiology, ancillary and laboratory) are listed below for reference.    Significant Diagnostic Studies: DG Chest 2 View  Result Date: 02/25/2021 CLINICAL DATA:  Cough. EXAM: CHEST - 2 VIEW COMPARISON:  07/19/2020 FINDINGS: The lungs are clear without focal pneumonia, edema, pneumothorax or pleural effusion. The cardiopericardial silhouette is within normal limits for size. Large hiatal hernia again noted. Right Port-A-Cath is been removed in the interval. Left IJ central line remains in place. IMPRESSION: 1. No acute  cardiopulmonary findings. 2. Large hiatal hernia. Electronically Signed   By: Misty Stanley M.D.   On: 02/25/2021 09:25    Microbiology: Recent Results (from the past 240 hour(s))  Resp Panel by RT-PCR (Flu A&B, Covid) Nasopharyngeal Swab     Status: None   Collection Time: 02/25/21 12:25 PM   Specimen: Nasopharyngeal Swab; Nasopharyngeal(NP) swabs in vial transport medium  Result Value Ref Range Status   SARS Coronavirus 2 by RT PCR NEGATIVE NEGATIVE Final    Comment: (NOTE) SARS-CoV-2 target nucleic acids are NOT DETECTED.  The SARS-CoV-2 RNA is generally detectable in upper respiratory specimens during the  acute phase of infection. The lowest concentration of SARS-CoV-2 viral copies this assay can detect is 138 copies/mL. A negative result does not preclude SARS-Cov-2 infection and should not be used as the sole basis for treatment or other patient management decisions. A negative result may occur with  improper specimen collection/handling, submission of specimen other than nasopharyngeal swab, presence of viral mutation(s) within the areas targeted by this assay, and inadequate number of viral copies(<138 copies/mL). A negative result must be combined with clinical observations, patient history, and epidemiological information. The expected result is Negative.  Fact Sheet for Patients:  EntrepreneurPulse.com.au  Fact Sheet for Healthcare Providers:  IncredibleEmployment.be  This test is no t yet approved or cleared by the [place] FDA and  has been authorized for detection and/or diagnosis of SARS-CoV-2 by FDA under an Emergency Use Authorization (EUA). This EUA will remain  in effect (meaning this test can be used) for the duration of the COVID-19 declaration under Section 564(b)(1) of the Act, 21 U.S.C.section 360bbb-3(b)(1), unless the authorization is terminated  or revoked sooner.       Influenza A by PCR NEGATIVE NEGATIVE  Final   Influenza B by PCR NEGATIVE NEGATIVE Final    Comment: (NOTE) The Xpert Xpress SARS-CoV-2/FLU/RSV plus assay is intended as an aid in the diagnosis of influenza from Nasopharyngeal swab specimens and should not be used as a sole basis for treatment. Nasal washings and aspirates are unacceptable for Xpert Xpress SARS-CoV-2/FLU/RSV testing.  Fact Sheet for Patients: EntrepreneurPulse.com.au  Fact Sheet for Healthcare Providers: IncredibleEmployment.be  This test is not yet approved or cleared by the [place] FDA and has been authorized for detection and/or diagnosis of SARS-CoV-2 by FDA under an Emergency Use Authorization (EUA). This EUA will remain in effect (meaning this test can be used) for the duration of the COVID-19 declaration under Section 564(b)(1) of the Act, 21 U.S.C. section 360bbb-3(b)(1), unless the authorization is terminated or revoked.  Performed at [facility], [place] [address], [place], [place] [address]      Labs: CBC: Recent Labs  Lab 02/25/21 0914 02/25/21 1909 02/26/21 0500 02/26/21 1410 02/27/21 0354 02/27/21 1642 02/28/21 0335  WBC 8.7  --  8.5  --  11.1* 12.2* 11.7*  NEUTROABS 6.4  --   --   --   --   --   --   HGB 10.2*   < > 10.1* 10.2* 9.3* 10.1* 9.4*  HCT 32.3*   < > 31.4* 32.5* 29.0* 30.7* 29.3*  MCV 92.8  --  91.3  --  90.6 88.0 91.0  PLT 352  --  255  --  333 284 270   < > = values in this interval not displayed.   Basic Metabolic Panel: Recent Labs  Lab 02/25/21 0914 02/26/21 0500 02/27/21 0354 02/28/21 0335  NA 137 135 136 135  K 3.8 4.2 4.6 4.1  CL 97* 96* 97* 98  CO2 28 27 27 27   GLUCOSE 109* 92 88 72  BUN 11 19 28* 13  CREATININE 3.58* 4.99* 6.36* 4.27*  CALCIUM 9.2 8.8* 8.7* 8.6*  PHOS  --  4.8* 5.4* 4.0   Liver Function Tests: Recent Labs  Lab 02/25/21 0914 02/26/21 0500 02/27/21 0354 02/28/21 0335  AST 13*  --   --   --   ALT 9  --   --   --   ALKPHOS  85  --   --   --   BILITOT 0.6  --   --   --  PROT 6.3*  --   --   --   ALBUMIN 2.4* 2.3* 2.1* 2.1*   CBG: No results for input(s): GLUCAP in the last 168 hours.  Time spent: 35 minutes  Signed:  Berle Mull  [facility] 02/28/2021

## 2021-03-05 DIAGNOSIS — Z7901 Long term (current) use of anticoagulants: Secondary | ICD-10-CM | POA: Insufficient documentation

## 2021-03-06 ENCOUNTER — Telehealth: Payer: Self-pay | Admitting: Internal Medicine

## 2021-03-06 NOTE — Telephone Encounter (Signed)
Pt is aware of her new OV with Dr Abbey Chatters on 8/4 at 23

## 2021-03-06 NOTE — Telephone Encounter (Signed)
patient's PCP called and wants her seen sooner than 9/7. I told her everyone was booked up and I have put patient on cancellation list to possibly move her up sooner.

## 2021-03-08 ENCOUNTER — Ambulatory Visit (INDEPENDENT_AMBULATORY_CARE_PROVIDER_SITE_OTHER): Payer: Medicare Other | Admitting: Internal Medicine

## 2021-03-08 ENCOUNTER — Other Ambulatory Visit: Payer: Self-pay

## 2021-03-08 ENCOUNTER — Encounter: Payer: Self-pay | Admitting: Internal Medicine

## 2021-03-08 VITALS — BP 116/62 | HR 87 | Temp 97.1°F | Ht <= 58 in | Wt 164.0 lb

## 2021-03-08 DIAGNOSIS — R110 Nausea: Secondary | ICD-10-CM

## 2021-03-08 DIAGNOSIS — K2100 Gastro-esophageal reflux disease with esophagitis, without bleeding: Secondary | ICD-10-CM | POA: Diagnosis not present

## 2021-03-08 DIAGNOSIS — K449 Diaphragmatic hernia without obstruction or gangrene: Secondary | ICD-10-CM

## 2021-03-08 DIAGNOSIS — K582 Mixed irritable bowel syndrome: Secondary | ICD-10-CM | POA: Diagnosis not present

## 2021-03-08 MED ORDER — PROMETHAZINE HCL 12.5 MG PO TABS: 12.5000 mg | ORAL_TABLET | Freq: Three times a day (TID) | ORAL | 2 refills | Status: AC | PRN

## 2021-03-08 NOTE — Progress Notes (Signed)
Referring Provider: [name], MD Primary Care Physician:  [name], MD Primary GI:  Dr. Abbey Chatters  Chief Complaint  Patient presents with   Rectal Bleeding    X2 1/2 weeks   Abdominal Pain    Longer than 2 1/2 weeks RUQ to the lower   Nausea    A lot with very little vomiting    HPI:   [name] is a 75 y.o. female who presents for follow-up visit.  Recently admitted to [facility] after presenting with acute on chronic anemia and GI bleeding.  Underwent EGD which showed large hiatal hernia, gastritis with erosions.  Biopsies showing chronic gastritis, negative for H. pylori.  Did have a Schatzki's ring as well which was dilated with [facility] dilator.  Treated supportively and discharged home.  States she is getting her blood checks regularly at dialysis and have been stable since discharge.  Main complaint for me today is ongoing nausea.  States she has this on dialysis days but also intermittently between dialysis days as well.  Has tried ondansetron without any relief in her symptoms.  GERD is relatively well controlled on Nexium 40 mg twice daily.  Past Medical History:  Diagnosis Date   Blood transfusion without reported diagnosis    CAD (coronary artery disease)    Nonobstructive at cardiac catheterization 2000   Cataract    Cervical cancer ([place]) 1978   CHF (congestive heart failure) (HCC)    Chronic back pain    Chronic kidney disease    Class 2 obesity with body mass index (BMI) of 35 to 39.9 without comorbidity    Complication of anesthesia    hard to awaken with one back surgery - years ago, no problem since   COPD (chronic obstructive pulmonary disease) ([place])    Degenerative disc disease    DM (diabetes mellitus), type 2 with renal complications ([place])    Dyslipidemia    Dyspnea    "due to COPD"   Dysrhythmia    a-fib   ESRD (end stage renal disease) on dialysis ([place])    [facility]- [address]   GERD (gastroesophageal reflux disease)     Gout    Hiatal hernia 07/27/2013   History of diverticulitis of colon    History of hiatal hernia    HTN (hypertension)    09/09/20 not currently on medication, is on medication for hypotension   Hypotension    Iron deficiency anemia    Irritable bowel syndrome    Lumbar radiculopathy    Mixed hyperlipidemia    Moderate major depression, 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 States this was cervical cancer   Oxygen deficiency    room air   PAF (paroxysmal atrial fibrillation) (HCC)    Pneumonia    Sleep apnea    bipap - oxygen 2 l to bipap.   Type 2 diabetes mellitus ([place])    Vitamin B deficiency 12/25/2009   Vitamin B12 deficiency     Past Surgical History:  Procedure Laterality Date   ABDOMINAL HYSTERECTOMY     APPLICATION OF WOUND VAC Right 01/15/2021   Procedure: APPLICATION OF WOUND VAC;  Surgeon: Cherre Robins, MD;  Location: [place];  Service: Vascular;  Laterality: Right;   AV FISTULA PLACEMENT Left 08/05/2019   Procedure: ARTERIOVENOUS (AV) FISTULA CREATION LEFT ARM;  Surgeon:  Waynetta Sandy, MD;  Location: [place];  Service: Vascular;  Laterality: Left;   AV FISTULA PLACEMENT Left 09/11/2020   Procedure: INSERTION OF ARTERIOVENOUS (AV) GORE-TEX GRAFT ARM LEFT;  Surgeon: Cherre Robins, MD;  Location: [place];  Service: Vascular;  Laterality: Left;   AV FISTULA PLACEMENT Right 11/29/2020   Procedure: Creation of Arteriovenous Fistula Right Upper Arm;  Surgeon: Rosetta Posner, MD;  Location: [place];  Service: Vascular;  Laterality: Right;   [place] Left 10/05/2019   Procedure: SECOND STAGE LEFT BASCILIC VEIN TRANSPOSITION;  Surgeon: Waynetta Sandy, MD;  Location: [place];  Service: Vascular;  Laterality: Left;   [place] Right 01/15/2021   Procedure: RIGHT UPPER ARM SECOND STAGE [place];  Surgeon: Cherre Robins, MD;  Location: [place];  Service: Vascular;  Laterality: Right;  PERIPHERAL NERVE BLOCK   Benign breast cysts     BIOPSY  02/26/2021   Procedure: BIOPSY;  Surgeon: Jackquline Denmark, MD;  Location: [facility] ENDOSCOPY;  Service: Endoscopy;;   CHOLECYSTECTOMY     COLONOSCOPY  10/01/2006   SLF:Pan colonic diverticulosis and moderate internal hemorrhoids/ Otherwise no polyps, masses, inflammatory changes or AVMs/   COLONOSCOPY  2011   SLF: pancolonic diverticulosis, large internal hemorrhoids   COLONOSCOPY N/A 01/26/2016   Procedure: COLONOSCOPY;  Surgeon: [name], MD;  [facility];  Service: Endoscopy;  Laterality: N/A;  830    COLONOSCOPY WITH PROPOFOL N/A 02/10/2020   Procedure: COLONOSCOPY WITH PROPOFOL;  Surgeon: Daneil Dolin, MD;  [facility];  Service: Endoscopy;  Laterality: N/A;  10:45am   ESOPHAGEAL DILATION  02/26/2021   Procedure: ESOPHAGEAL DILATION;  Surgeon: Jackquline Denmark, MD;  Location: [facility] ENDOSCOPY;  Service: Endoscopy;;   ESOPHAGOGASTRODUODENOSCOPY  11/19/2006   SLF: Large hiatal hernia without evidence of Cameron ulcers/. Distal esophageal stricture, which allowed the gastroscope to pass without resistance.  A 16 mm Savary later passed with mild resistance/ Normal stomach.sb bx negative   ESOPHAGOGASTRODUODENOSCOPY  10/01/2006   JWJ:[id number] hiatal hernia.  Distal esophagus without evidence of   erythema, ulceration or Barrett's esophagus   ESOPHAGOGASTRODUODENOSCOPY  2011   SLF: large hh, distal esophageal web narrowing to 10mm s/p dilation to 75mm   ESOPHAGOGASTRODUODENOSCOPY N/A 08/06/2013   SLF: 1. Stricture at the gastroesophageal junction 2. large hiatal hernia. 3. Mild erosive gastritis.   ESOPHAGOGASTRODUODENOSCOPY (EGD) WITH PROPOFOL N/A 07/19/2019   rourk: Mild erosive reflux esophagitis.  Mild Schatzki ring status post dilation.  Large hiatal hernia with at least one half of the stomach above the diaphragm.   Gastric mucosa erythematous.   ESOPHAGOGASTRODUODENOSCOPY (EGD) WITH PROPOFOL N/A 02/26/2021   Procedure: ESOPHAGOGASTRODUODENOSCOPY (EGD) WITH PROPOFOL;  Surgeon: Jackquline Denmark, MD;  Location: [facility] ENDOSCOPY;  Service: Endoscopy;  Laterality: N/A;   GIVENS CAPSULE STUDY N/A 08/06/2013   INCOMPLETE-SMALL BOWLE ULCERS   IR FLUORO GUIDE CV LINE LEFT  07/29/2019   IR US GUIDE VASC ACCESS LEFT  07/29/2019   KNEE SURGERY Right    PARTIAL HYSTERECTOMY  1978   PORT-A-CATH REMOVAL Right 11/29/2020   Procedure: REMOVAL OF RIGHT CHEST PORT;  Surgeon: Rosetta Posner, MD;  Location: [place];  Service: Vascular;  Laterality: Right;   small bowel capsule  2008   negative   SPINE SURGERY     TONSILLECTOMY AND ADENOIDECTOMY     Two back surgeries/fusion     UMBILICAL HERNIA REPAIR  2010  UPPER EXTREMITY VENOGRAPHY N/A 11/22/2020   Procedure: UPPER EXTREMITY VENOGRAPHY;  Surgeon: Cherre Robins, MD;  Location: [place] CV LAB;  Service: Cardiovascular;  Laterality: N/A;    Current Outpatient Medications  Medication Sig Dispense Refill   acetaminophen (TYLENOL) 325 MG tablet Take 650 mg by mouth daily as needed for headache (pain).     albuterol (PROVENTIL) (2.5 MG/3ML) 0.083% nebulizer solution Take 2.5 mg by nebulization 4 (four) times daily as needed for wheezing or shortness of breath.      allopurinol (ZYLOPRIM) 100 MG tablet Take 1 tablet (100 mg total) by mouth 2 (two) times daily. (Patient taking differently: Take 100 mg by mouth at bedtime.)     amiodarone (PACERONE) 200 MG tablet Take 1 tablet (200 mg total) by mouth in the morning.     apixaban (ELIQUIS) 2.5 MG TABS tablet Take 2.5 mg by mouth 2 (two) times daily.     atorvastatin (LIPITOR) 40 MG tablet Take 40 mg by mouth daily in the afternoon. In the afternoon     Biotin 5000 MCG TABS Take 5,000 mcg by mouth in the morning.     Camphor-Menthol-Methyl Sal (SALONPAS) 3.08-10-08 % PTCH Place 1 patch onto the skin daily as needed (pain).      Carboxymethylcellul-Glycerin (CLEAR EYES FOR DRY EYES) 1-0.25 % SOLN Place 1 drop into both eyes daily as needed (dry eys).     Cholecalciferol (VITAMIN D3) 50 MCG (2000 UT) TABS Take 2,000 Units by mouth in the morning.     esomeprazole (NEXIUM) 40 MG capsule Take 1 capsule (40 mg total) by mouth 2 (two) times daily before a meal. 60 capsule 0   famotidine (PEPCID) 20 MG tablet Take 20 mg by mouth every evening.     ferric citrate (AURYXIA) 1 GM 210 MG(Fe) tablet Take 210 mg by mouth 3 (three) times daily with meals.     fluticasone (FLONASE) 50 MCG/ACT nasal spray Place 1 spray into both nostrils daily.     Fluticasone-Umeclidin-Vilant (TRELEGY ELLIPTA) 100-62.5-25 MCG/INH AEPB Inhale 1 puff into the lungs daily at 12 noon.     gabapentin (NEURONTIN) 100 MG capsule Take 100 mg by mouth at bedtime.     nitroGLYCERIN (NITROSTAT) 0.4 MG SL tablet Place 0.4 mg under the tongue every 5 (five) minutes x 3 doses as needed for chest pain.     ondansetron (ZOFRAN) 4 MG tablet Take 4 mg by mouth every 8 (eight) hours as needed for nausea/vomiting, nausea or vomiting.     OXYGEN Inhale 2 L into the lungs See admin instructions. Use every night and as needed during the day     PRESCRIPTION MEDICATION Inhale into the lungs at bedtime. Bipap     sucralfate (CARAFATE) 1 GM/10ML suspension Take 10 mLs (1 g total) by mouth 2 (two) times daily for 14 days. 280 mL 0   midodrine (PROAMATINE) 10 MG tablet Take 1 tablet (10 mg total) by mouth 3 (three) times daily with meals. (Patient not taking: Reported on 03/08/2021)     Nutritional Supplements (FEEDING SUPPLEMENT, NEPRO CARB STEADY,) LIQD Take 237 mLs by mouth 2 (two) times daily between meals. (Patient not taking: Reported on 03/08/2021) 10000 mL 0   oxyCODONE-acetaminophen (PERCOCET) 5-325 MG tablet Take 1 tablet by mouth every 6 (six) hours as needed for severe pain. (Patient not taking: Reported on 03/08/2021) 20 tablet 0   Current Facility-Administered Medications   Medication Dose Route Frequency Provider Last Rate Last Admin   0.9 %  sodium chloride infusion  250 mL Intravenous PRN Cherre Robins, MD       sodium chloride flush (NS) 0.9 % injection 3 mL  3 mL Intravenous Q12H Cherre Robins, MD       sodium chloride flush (NS) 0.9 % injection 3 mL  3 mL Intravenous PRN Cherre Robins, MD        Allergies as of 03/08/2021   (No Known Allergies)    Family History  Problem Relation Age of Onset   Colon cancer Brother        diagnosed age 28. Living.    Ulcers Sister    Diabetes Sister    Heart attack Sister    Kidney failure Sister    Stroke Sister    Ulcers Mother    Diabetes Mother    Heart attack Mother    Stroke Mother    Asthma Mother    Heart disease Mother    Cervical cancer Mother    Heart attack Brother    Heart disease Brother    Asthma Sister    Diabetes Brother    Stroke Maternal Grandmother    Heart attack Maternal Grandmother    Heart attack Other    Early death Father        MVA in his 4s    Social History   Socioeconomic History   Marital status: Widowed    Spouse name: Ringler   Number of children: 4   Years of education: Not on file   Highest education level: 10th grade  Occupational History   Occupation: retired    Comment: Ambulance person, [employer] work   Occupation: retired  Tobacco Use   Smoking status: Former    Packs/day: 1.00    Years: 1.00    Pack years: 1.00    Types: Cigarettes    Start date: 02/19/1961    Quit date: 08/05/1961    Years since quitting: 59.6   Smokeless tobacco: Never   Tobacco comments:    1 year in her lifetime  Vaping Use   Vaping Use: Never used  Substance and Sexual Activity   Alcohol use: Never   Drug use: Never   Sexual activity: Not Currently  Other Topics Concern   Not on file  Social History Narrative   ** Merged History Encounter **       HAS 4 SON-GRAND KIDS [place]. Lives in home with Scheier - married 13 Y Cook, sew, quilt, crochet    Social Determinants of Health   Financial Resource Strain: Not on file  Food Insecurity: Not on file  Transportation Needs: Not on file  Physical Activity: Not on file  Stress: Not on file  Social Connections: Not on file    Subjective: Review of Systems  Constitutional:  Negative for chills and fever.  HENT:  Negative for congestion and hearing loss.   Eyes:  Negative for blurred vision and double vision.  Respiratory:  Negative for cough and shortness of breath.   Cardiovascular:  Negative for chest pain and palpitations.  Gastrointestinal:  Negative for abdominal pain, blood in stool, constipation, diarrhea, heartburn, melena and vomiting.  Genitourinary:  Negative for dysuria and urgency.  Musculoskeletal:  Negative for joint pain and myalgias.  Skin:  Negative for itching and rash.  Neurological:  Negative for dizziness and headaches.  Psychiatric/Behavioral:  Negative for depression. The patient is not nervous/anxious.     Objective: BP 116/62   Pulse 87  Temp (!) 97.1 F (36.2 C)   Ht 4\' 8"  (1.422 m)   Wt 164 lb (74.4 kg)   LMP  (LMP Unknown)   BMI 36.77 kg/m  Physical Exam Constitutional:      Appearance: Normal appearance.  HENT:     Head: Normocephalic and atraumatic.  Eyes:     Extraocular Movements: Extraocular movements intact.     Conjunctiva/sclera: Conjunctivae normal.  Cardiovascular:     Rate and Rhythm: Normal rate and regular rhythm.  Pulmonary:     Effort: Pulmonary effort is normal.     Breath sounds: Normal breath sounds.  Abdominal:     General: Bowel sounds are normal.     Palpations: Abdomen is soft.  Musculoskeletal:        General: No swelling. Normal range of motion.     Cervical back: Normal range of motion and neck supple.  Skin:    General: Skin is warm and dry.     Coloration: Skin is not jaundiced.  Neurological:     General: No focal deficit present.     Mental Status: She is alert and oriented to person, place, and  time.  Psychiatric:        Mood and Affect: Mood normal.        Behavior: Behavior normal.     Assessment: *Nausea without vomiting *GERD *Hiatal hernia  Plan: Patient's main complaint for me today is ongoing nausea which she describes as severe.  Ondansetron not working.  I will send her in as needed Phenergan.  Discussed given her age this is a high risk medication.  Counseled on potential risks of side effects including drowsiness, confusion, dry mouth, constipation.  Recommend she stop this medication immediately if the symptoms occur and she understands.  Think a lot of her upper GI symptoms are related to the large hiatal hernia.  Do not think she is the best surgical candidate, but may consider referral to surgery to at least discuss any potential options for correction.  Continue on Nexium twice daily.  Follow-up in 6 to 8 weeks.  03/08/2021 12:31 PM   Disclaimer: This note was dictated with voice recognition software. Similar sounding words can inadvertently be transcribed and may not be corrected upon review.

## 2021-03-08 NOTE — Patient Instructions (Signed)
For your nausea, I am going to start you on a new medication called promethazine every 8 hours as needed.  Hopefully this helps.  This medication can cause side effects including drowsiness, confusion, dry mouth, constipation.  If you have any of the symptoms then stop the medication.  Continue on as omeprazole for your chronic acid reflux.  We may consider surgical referral in the future to at least discuss if there are any potential surgical options to fix your hiatal hernia as I believe this is a big component of your upper GI issues.  Otherwise follow-up in 2 to 3 months.  Was nice seeing you today.  Dr. Abbey Chatters

## 2021-03-12 ENCOUNTER — Telehealth: Payer: Self-pay | Admitting: Internal Medicine

## 2021-03-12 NOTE — Telephone Encounter (Signed)
Pt had OV last week with Dr Abbey Chatters and was prescribed Phenergan. The son is having trouble with pharmacy and thinks she needs a PA. Please call [name] (son) at [phone]

## 2021-03-13 ENCOUNTER — Inpatient Hospital Stay (HOSPITAL_COMMUNITY)
Admission: EM | Admit: 2021-03-13 | Discharge: 2021-03-18 | DRG: 377 | Disposition: A | Discharge status: Home Health | Payer: Medicare Other | Attending: Internal Medicine | Admitting: Internal Medicine

## 2021-03-13 ENCOUNTER — Other Ambulatory Visit: Payer: Self-pay

## 2021-03-13 ENCOUNTER — Encounter (HOSPITAL_COMMUNITY): Payer: Self-pay | Admitting: Pharmacy Technician

## 2021-03-13 ENCOUNTER — Observation Stay (HOSPITAL_COMMUNITY): Payer: Medicare Other

## 2021-03-13 DIAGNOSIS — J9610 Chronic respiratory failure, unspecified whether with hypoxia or hypercapnia: Secondary | ICD-10-CM | POA: Diagnosis present

## 2021-03-13 DIAGNOSIS — Z20822 Contact with and (suspected) exposure to covid-19: Secondary | ICD-10-CM | POA: Diagnosis present

## 2021-03-13 DIAGNOSIS — K5792 Diverticulitis of intestine, part unspecified, without perforation or abscess without bleeding: Secondary | ICD-10-CM

## 2021-03-13 DIAGNOSIS — Z992 Dependence on renal dialysis: Secondary | ICD-10-CM

## 2021-03-13 DIAGNOSIS — Z87891 Personal history of nicotine dependence: Secondary | ICD-10-CM

## 2021-03-13 DIAGNOSIS — Z825 Family history of asthma and other chronic lower respiratory diseases: Secondary | ICD-10-CM

## 2021-03-13 DIAGNOSIS — G4733 Obstructive sleep apnea (adult) (pediatric): Secondary | ICD-10-CM | POA: Diagnosis present

## 2021-03-13 DIAGNOSIS — J9811 Atelectasis: Secondary | ICD-10-CM | POA: Diagnosis present

## 2021-03-13 DIAGNOSIS — J449 Chronic obstructive pulmonary disease, unspecified: Secondary | ICD-10-CM | POA: Diagnosis present

## 2021-03-13 DIAGNOSIS — K3184 Gastroparesis: Secondary | ICD-10-CM | POA: Diagnosis present

## 2021-03-13 DIAGNOSIS — E669 Obesity, unspecified: Secondary | ICD-10-CM | POA: Diagnosis present

## 2021-03-13 DIAGNOSIS — I959 Hypotension, unspecified: Secondary | ICD-10-CM | POA: Diagnosis present

## 2021-03-13 DIAGNOSIS — R519 Headache, unspecified: Secondary | ICD-10-CM | POA: Diagnosis not present

## 2021-03-13 DIAGNOSIS — E1142 Type 2 diabetes mellitus with diabetic polyneuropathy: Secondary | ICD-10-CM | POA: Diagnosis present

## 2021-03-13 DIAGNOSIS — Z8541 Personal history of malignant neoplasm of cervix uteri: Secondary | ICD-10-CM

## 2021-03-13 DIAGNOSIS — M810 Age-related osteoporosis without current pathological fracture: Secondary | ICD-10-CM | POA: Diagnosis present

## 2021-03-13 DIAGNOSIS — I2510 Atherosclerotic heart disease of native coronary artery without angina pectoris: Secondary | ICD-10-CM | POA: Diagnosis present

## 2021-03-13 DIAGNOSIS — R251 Tremor, unspecified: Secondary | ICD-10-CM | POA: Diagnosis present

## 2021-03-13 DIAGNOSIS — I10 Essential (primary) hypertension: Secondary | ICD-10-CM | POA: Diagnosis present

## 2021-03-13 DIAGNOSIS — K921 Melena: Principal | ICD-10-CM | POA: Diagnosis present

## 2021-03-13 DIAGNOSIS — K5732 Diverticulitis of large intestine without perforation or abscess without bleeding: Secondary | ICD-10-CM | POA: Diagnosis present

## 2021-03-13 DIAGNOSIS — K2100 Gastro-esophageal reflux disease with esophagitis, without bleeding: Secondary | ICD-10-CM | POA: Diagnosis present

## 2021-03-13 DIAGNOSIS — M109 Gout, unspecified: Secondary | ICD-10-CM | POA: Diagnosis present

## 2021-03-13 DIAGNOSIS — E875 Hyperkalemia: Secondary | ICD-10-CM | POA: Diagnosis present

## 2021-03-13 DIAGNOSIS — Z833 Family history of diabetes mellitus: Secondary | ICD-10-CM

## 2021-03-13 DIAGNOSIS — Z8616 Personal history of COVID-19: Secondary | ICD-10-CM

## 2021-03-13 DIAGNOSIS — K449 Diaphragmatic hernia without obstruction or gangrene: Secondary | ICD-10-CM

## 2021-03-13 DIAGNOSIS — E1143 Type 2 diabetes mellitus with diabetic autonomic (poly)neuropathy: Secondary | ICD-10-CM | POA: Diagnosis present

## 2021-03-13 DIAGNOSIS — Z6836 Body mass index (BMI) 36.0-36.9, adult: Secondary | ICD-10-CM

## 2021-03-13 DIAGNOSIS — K922 Gastrointestinal hemorrhage, unspecified: Secondary | ICD-10-CM | POA: Diagnosis present

## 2021-03-13 DIAGNOSIS — I480 Paroxysmal atrial fibrillation: Secondary | ICD-10-CM | POA: Diagnosis present

## 2021-03-13 DIAGNOSIS — I132 Hypertensive heart and chronic kidney disease with heart failure and with stage 5 chronic kidney disease, or end stage renal disease: Secondary | ICD-10-CM | POA: Diagnosis present

## 2021-03-13 DIAGNOSIS — E1122 Type 2 diabetes mellitus with diabetic chronic kidney disease: Secondary | ICD-10-CM | POA: Diagnosis present

## 2021-03-13 DIAGNOSIS — Z7901 Long term (current) use of anticoagulants: Secondary | ICD-10-CM

## 2021-03-13 DIAGNOSIS — E782 Mixed hyperlipidemia: Secondary | ICD-10-CM | POA: Diagnosis present

## 2021-03-13 DIAGNOSIS — Z8049 Family history of malignant neoplasm of other genital organs: Secondary | ICD-10-CM

## 2021-03-13 DIAGNOSIS — Z7951 Long term (current) use of inhaled steroids: Secondary | ICD-10-CM

## 2021-03-13 DIAGNOSIS — R112 Nausea with vomiting, unspecified: Secondary | ICD-10-CM

## 2021-03-13 DIAGNOSIS — Z9115 Patient's noncompliance with renal dialysis: Secondary | ICD-10-CM

## 2021-03-13 DIAGNOSIS — I5032 Chronic diastolic (congestive) heart failure: Secondary | ICD-10-CM | POA: Diagnosis present

## 2021-03-13 DIAGNOSIS — N186 End stage renal disease: Secondary | ICD-10-CM | POA: Diagnosis present

## 2021-03-13 DIAGNOSIS — R1110 Vomiting, unspecified: Secondary | ICD-10-CM

## 2021-03-13 DIAGNOSIS — D631 Anemia in chronic kidney disease: Secondary | ICD-10-CM | POA: Diagnosis present

## 2021-03-13 DIAGNOSIS — Z8249 Family history of ischemic heart disease and other diseases of the circulatory system: Secondary | ICD-10-CM

## 2021-03-13 DIAGNOSIS — E11649 Type 2 diabetes mellitus with hypoglycemia without coma: Secondary | ICD-10-CM | POA: Diagnosis not present

## 2021-03-13 DIAGNOSIS — D62 Acute posthemorrhagic anemia: Secondary | ICD-10-CM | POA: Diagnosis present

## 2021-03-13 DIAGNOSIS — Z79899 Other long term (current) drug therapy: Secondary | ICD-10-CM

## 2021-03-13 LAB — CBC
HCT: 26.6 % — ABNORMAL LOW (ref 36.0–46.0)
HCT: 24.3 % — ABNORMAL LOW (ref 36.0–46.0)
Hemoglobin: 8.6 g/dL — ABNORMAL LOW (ref 12.0–15.0)
Hemoglobin: 8.1 g/dL — ABNORMAL LOW (ref 12.0–15.0)
MCH: 29.2 pg (ref 26.0–34.0)
MCH: 29.8 pg (ref 26.0–34.0)
MCHC: 32.3 g/dL (ref 30.0–36.0)
MCHC: 33.3 g/dL (ref 30.0–36.0)
MCV: 90.2 fL (ref 80.0–100.0)
MCV: 89.3 fL (ref 80.0–100.0)
Platelets: 400 10*3/uL (ref 150–400)
Platelets: 357 10*3/uL (ref 150–400)
RBC: 2.95 MIL/uL — ABNORMAL LOW (ref 3.87–5.11)
RBC: 2.72 MIL/uL — ABNORMAL LOW (ref 3.87–5.11)
RDW: 15.6 % — ABNORMAL HIGH (ref 11.5–15.5)
RDW: 15.9 % — ABNORMAL HIGH (ref 11.5–15.5)
WBC: 17.1 10*3/uL — ABNORMAL HIGH (ref 4.0–10.5)
WBC: 16.9 10*3/uL — ABNORMAL HIGH (ref 4.0–10.5)
nRBC: 0 % (ref 0.0–0.2)
nRBC: 0 % (ref 0.0–0.2)

## 2021-03-13 LAB — BASIC METABOLIC PANEL
Anion gap: 17 — ABNORMAL HIGH (ref 5–15)
BUN: 62 mg/dL — ABNORMAL HIGH (ref 8–23)
CO2: 26 mmol/L (ref 22–32)
Calcium: 9.2 mg/dL (ref 8.9–10.3)
Chloride: 98 mmol/L (ref 98–111)
Creatinine, Ser: 8.78 mg/dL — ABNORMAL HIGH (ref 0.44–1.00)
GFR, Estimated: 4 mL/min — ABNORMAL LOW (ref >60)
Glucose, Bld: 135 mg/dL — ABNORMAL HIGH (ref 70–99)
Potassium: 5.8 mmol/L — ABNORMAL HIGH (ref 3.5–5.1)
Sodium: 141 mmol/L (ref 135–145)

## 2021-03-13 LAB — COMPREHENSIVE METABOLIC PANEL
ALT: 9 U/L (ref 0–44)
AST: 10 U/L — ABNORMAL LOW (ref 15–41)
Albumin: 2 g/dL — ABNORMAL LOW (ref 3.5–5.0)
Alkaline Phosphatase: 92 U/L (ref 38–126)
Anion gap: 14 (ref 5–15)
BUN: 57 mg/dL — ABNORMAL HIGH (ref 8–23)
CO2: 28 mmol/L (ref 22–32)
Calcium: 9.3 mg/dL (ref 8.9–10.3)
Chloride: 96 mmol/L — ABNORMAL LOW (ref 98–111)
Creatinine, Ser: 8.49 mg/dL — ABNORMAL HIGH (ref 0.44–1.00)
GFR, Estimated: 4 mL/min — ABNORMAL LOW (ref >60)
Glucose, Bld: 118 mg/dL — ABNORMAL HIGH (ref 70–99)
Potassium: 6.6 mmol/L (ref 3.5–5.1)
Sodium: 138 mmol/L (ref 135–145)
Total Bilirubin: 0.5 mg/dL (ref 0.3–1.2)
Total Protein: 6 g/dL — ABNORMAL LOW (ref 6.5–8.1)

## 2021-03-13 LAB — I-STAT CHEM 8, ED
BUN: 52 mg/dL — ABNORMAL HIGH (ref 8–23)
Calcium, Ion: 1.09 mmol/L — ABNORMAL LOW (ref 1.15–1.40)
Chloride: 101 mmol/L (ref 98–111)
Creatinine, Ser: 9.3 mg/dL — ABNORMAL HIGH (ref 0.44–1.00)
Glucose, Bld: 184 mg/dL — ABNORMAL HIGH (ref 70–99)
HCT: 25 % — ABNORMAL LOW (ref 36.0–46.0)
Hemoglobin: 8.5 g/dL — ABNORMAL LOW (ref 12.0–15.0)
Potassium: 6 mmol/L — ABNORMAL HIGH (ref 3.5–5.1)
Sodium: 137 mmol/L (ref 135–145)
TCO2: 27 mmol/L (ref 22–32)

## 2021-03-13 LAB — CBC WITH DIFFERENTIAL/PLATELET
Abs Immature Granulocytes: 0.49 10*3/uL — ABNORMAL HIGH (ref 0.00–0.07)
Basophils Absolute: 0.1 10*3/uL (ref 0.0–0.1)
Basophils Relative: 1 %
Eosinophils Absolute: 0.2 10*3/uL (ref 0.0–0.5)
Eosinophils Relative: 1 %
HCT: 29 % — ABNORMAL LOW (ref 36.0–46.0)
Hemoglobin: 9.4 g/dL — ABNORMAL LOW (ref 12.0–15.0)
Immature Granulocytes: 3 %
Lymphocytes Relative: 7 %
Lymphs Abs: 1.1 10*3/uL (ref 0.7–4.0)
MCH: 29.7 pg (ref 26.0–34.0)
MCHC: 32.4 g/dL (ref 30.0–36.0)
MCV: 91.8 fL (ref 80.0–100.0)
Monocytes Absolute: 1.3 10*3/uL — ABNORMAL HIGH (ref 0.1–1.0)
Monocytes Relative: 9 %
Neutro Abs: 12.4 10*3/uL — ABNORMAL HIGH (ref 1.7–7.7)
Neutrophils Relative %: 79 %
Platelets: 390 10*3/uL (ref 150–400)
RBC: 3.16 MIL/uL — ABNORMAL LOW (ref 3.87–5.11)
RDW: 16.1 % — ABNORMAL HIGH (ref 11.5–15.5)
WBC: 15.6 10*3/uL — ABNORMAL HIGH (ref 4.0–10.5)
nRBC: 0 % (ref 0.0–0.2)

## 2021-03-13 LAB — LIPASE, BLOOD: Lipase: 26 U/L (ref 11–51)

## 2021-03-13 LAB — RESP PANEL BY RT-PCR (FLU A&B, COVID) ARPGX2
Influenza A by PCR: NEGATIVE
Influenza B by PCR: NEGATIVE
SARS Coronavirus 2 by RT PCR: NEGATIVE

## 2021-03-13 MED ORDER — CALCITRIOL 0.5 MCG PO CAPS
1.2500 ug | ORAL_CAPSULE | ORAL | Status: DC
  Administered 2021-03-14 – 2021-03-16 (×2): 1.25 ug via ORAL
  Filled 2021-03-13 (×2): qty 1

## 2021-03-13 MED ORDER — SODIUM CHLORIDE 0.9 % IV SOLN: 250.0000 mL | INTRAVENOUS | Status: DC | PRN

## 2021-03-13 MED ORDER — SODIUM CHLORIDE 0.9% FLUSH
3.0000 mL | Freq: Two times a day (BID) | INTRAVENOUS | Status: DC
  Administered 2021-03-14 – 2021-03-18 (×9): 3 mL via INTRAVENOUS

## 2021-03-13 MED ORDER — SODIUM CHLORIDE 0.9% FLUSH
3.0000 mL | INTRAVENOUS | Status: DC | PRN
  Administered 2021-03-17 – 2021-03-18 (×2): 3 mL via INTRAVENOUS

## 2021-03-13 MED ORDER — FLUTICASONE FUROATE-VILANTEROL 100-25 MCG/INH IN AEPB
1.0000 | INHALATION_SPRAY | Freq: Every day | RESPIRATORY_TRACT | Status: DC
  Administered 2021-03-14 – 2021-03-16 (×3): 1 via RESPIRATORY_TRACT
  Filled 2021-03-13: qty 28

## 2021-03-13 MED ORDER — SODIUM BICARBONATE 8.4 % IV SOLN
50.0000 meq | Freq: Once | INTRAVENOUS | Status: AC
  Administered 2021-03-13: 50 meq via INTRAVENOUS
  Filled 2021-03-13: qty 50

## 2021-03-13 MED ORDER — PANTOPRAZOLE 80MG IVPB - SIMPLE MED
80.0000 mg | Freq: Two times a day (BID) | INTRAVENOUS | Status: DC
  Administered 2021-03-13: 80 mg via INTRAVENOUS
  Filled 2021-03-13: qty 80
  Filled 2021-03-13 (×2): qty 100

## 2021-03-13 MED ORDER — DEXTROSE 50 % IV SOLN
1.0000 | Freq: Once | INTRAVENOUS | Status: AC
  Administered 2021-03-13: 50 mL via INTRAVENOUS
  Filled 2021-03-13: qty 50

## 2021-03-13 MED ORDER — PANTOPRAZOLE SODIUM 40 MG PO TBEC: 80.0000 mg | DELAYED_RELEASE_TABLET | Freq: Two times a day (BID) | ORAL | Status: DC

## 2021-03-13 MED ORDER — CALCIUM GLUCONATE-NACL 1-0.675 GM/50ML-% IV SOLN
1.0000 g | Freq: Once | INTRAVENOUS | Status: AC
  Administered 2021-03-13: 1000 mg via INTRAVENOUS
  Filled 2021-03-13: qty 50

## 2021-03-13 MED ORDER — FERRIC CITRATE 1 GM 210 MG(FE) PO TABS
210.0000 mg | ORAL_TABLET | Freq: Three times a day (TID) | ORAL | Status: DC
  Administered 2021-03-14 – 2021-03-18 (×12): 210 mg via ORAL
  Filled 2021-03-13 (×12): qty 1

## 2021-03-13 MED ORDER — SODIUM CHLORIDE 0.9 % IV SOLN
12.5000 mg | Freq: Four times a day (QID) | INTRAVENOUS | Status: DC | PRN
  Administered 2021-03-14: 12.5 mg via INTRAVENOUS
  Filled 2021-03-13 (×2): qty 0.5
  Filled 2021-03-13: qty 12.5
  Filled 2021-03-13: qty 0.5

## 2021-03-13 MED ORDER — UMECLIDINIUM BROMIDE 62.5 MCG/INH IN AEPB
1.0000 | INHALATION_SPRAY | Freq: Every day | RESPIRATORY_TRACT | Status: DC
  Administered 2021-03-14 – 2021-03-16 (×3): 1 via RESPIRATORY_TRACT
  Filled 2021-03-13: qty 7

## 2021-03-13 MED ORDER — FLUTICASONE-UMECLIDIN-VILANT 100-62.5-25 MCG/INH IN AEPB: 1.0000 | INHALATION_SPRAY | Freq: Every day | RESPIRATORY_TRACT | Status: DC

## 2021-03-13 MED ORDER — ONDANSETRON HCL 4 MG PO TABS
4.0000 mg | ORAL_TABLET | Freq: Four times a day (QID) | ORAL | Status: DC | PRN
  Filled 2021-03-13: qty 1

## 2021-03-13 MED ORDER — ONDANSETRON HCL 4 MG/2ML IJ SOLN
4.0000 mg | Freq: Once | INTRAMUSCULAR | Status: AC
  Administered 2021-03-13: 4 mg via INTRAVENOUS
  Filled 2021-03-13: qty 2

## 2021-03-13 MED ORDER — ALBUTEROL SULFATE (2.5 MG/3ML) 0.083% IN NEBU
10.0000 mg | INHALATION_SOLUTION | Freq: Once | RESPIRATORY_TRACT | Status: AC
  Administered 2021-03-13: 10 mg via RESPIRATORY_TRACT
  Filled 2021-03-13: qty 12

## 2021-03-13 MED ORDER — GABAPENTIN 100 MG PO CAPS
100.0000 mg | ORAL_CAPSULE | Freq: Every day | ORAL | Status: DC
  Administered 2021-03-14 – 2021-03-17 (×4): 100 mg via ORAL
  Filled 2021-03-13 (×5): qty 1

## 2021-03-13 MED ORDER — FAMOTIDINE 20 MG PO TABS
20.0000 mg | ORAL_TABLET | Freq: Every evening | ORAL | Status: DC
  Administered 2021-03-14 – 2021-03-17 (×4): 20 mg via ORAL
  Filled 2021-03-13 (×5): qty 1

## 2021-03-13 MED ORDER — ALLOPURINOL 100 MG PO TABS
100.0000 mg | ORAL_TABLET | Freq: Every day | ORAL | Status: DC
  Administered 2021-03-14 – 2021-03-17 (×4): 100 mg via ORAL
  Filled 2021-03-13 (×5): qty 1

## 2021-03-13 MED ORDER — ATORVASTATIN CALCIUM 40 MG PO TABS
40.0000 mg | ORAL_TABLET | Freq: Every day | ORAL | Status: DC
  Administered 2021-03-14 – 2021-03-17 (×4): 40 mg via ORAL
  Filled 2021-03-13 (×5): qty 1

## 2021-03-13 MED ORDER — NEPRO/CARBSTEADY PO LIQD
237.0000 mL | Freq: Two times a day (BID) | ORAL | Status: DC
  Administered 2021-03-14 – 2021-03-15 (×3): 237 mL via ORAL

## 2021-03-13 MED ORDER — NITROGLYCERIN 0.4 MG SL SUBL: 0.4000 mg | SUBLINGUAL_TABLET | SUBLINGUAL | Status: DC | PRN

## 2021-03-13 MED ORDER — SODIUM POLYSTYRENE SULFONATE 15 GM/60ML PO SUSP
30.0000 g | Freq: Once | ORAL | Status: AC
  Administered 2021-03-13: 30 g via ORAL
  Filled 2021-03-13: qty 120

## 2021-03-13 MED ORDER — CALCITRIOL 0.5 MCG PO CAPS
1.2500 ug | ORAL_CAPSULE | Freq: Once | ORAL | Status: AC
  Filled 2021-03-13: qty 1

## 2021-03-13 MED ORDER — SODIUM ZIRCONIUM CYCLOSILICATE 5 G PO PACK
5.0000 g | PACK | Freq: Once | ORAL | Status: AC
  Administered 2021-03-13: 5 g via ORAL
  Filled 2021-03-13: qty 1

## 2021-03-13 MED ORDER — ONDANSETRON HCL 4 MG/2ML IJ SOLN
4.0000 mg | Freq: Four times a day (QID) | INTRAMUSCULAR | Status: DC | PRN
  Administered 2021-03-13: 4 mg via INTRAVENOUS
  Filled 2021-03-13: qty 2

## 2021-03-13 MED ORDER — CHLORHEXIDINE GLUCONATE CLOTH 2 % EX PADS
6.0000 | MEDICATED_PAD | Freq: Every day | CUTANEOUS | Status: DC
  Administered 2021-03-14 – 2021-03-18 (×5): 6 via TOPICAL

## 2021-03-13 MED ORDER — AMIODARONE HCL 200 MG PO TABS
200.0000 mg | ORAL_TABLET | Freq: Every morning | ORAL | Status: DC
  Administered 2021-03-15 – 2021-03-18 (×4): 200 mg via ORAL
  Filled 2021-03-13 (×7): qty 1

## 2021-03-13 MED ORDER — INSULIN ASPART 100 UNIT/ML IJ SOLN: 0.0000 [IU] | Freq: Three times a day (TID) | INTRAMUSCULAR | Status: DC

## 2021-03-13 MED ORDER — ALBUTEROL SULFATE (2.5 MG/3ML) 0.083% IN NEBU: 2.5000 mg | INHALATION_SOLUTION | Freq: Four times a day (QID) | RESPIRATORY_TRACT | Status: DC | PRN

## 2021-03-13 MED ORDER — INSULIN ASPART 100 UNIT/ML IV SOLN
10.0000 [IU] | Freq: Once | INTRAVENOUS | Status: AC
  Administered 2021-03-13: 10 [IU] via INTRAVENOUS

## 2021-03-13 NOTE — ED Triage Notes (Signed)
Pt here with reports of nausea, dry heaves and blood in her stool. States symptoms are the same from last month. Pt reports blood in stool is bright red.

## 2021-03-13 NOTE — ED Provider Notes (Signed)
Emergency Medicine Provider Triage Evaluation Note  [name] , a 75 y.o. female  was evaluated in triage.  Pt complains of this followed at [facility] who presents to the emergency department with chief complaint of right upper quadrant abdominal pain, nausea, vomiting, retching and bright red blood in stool.  Patient was recently admitted to [facility] with the same symptoms.  She underwent an EGD that showed a large hiatal hernia, gastritis with erosions and had dilation of a Schatzki's ring.  Patient saw her GI specialist in the outpatient setting 5 days ago.  She has persistent symptoms and presented for evaluation because she would not be able to follow-up with him for another few months.  Review of Systems  Positive: Abdominal pain, nausea and vomiting Negative: Fever  Physical Exam  BP (!) 129/36   Pulse 95   Temp 98.1 F (36.7 C) (Oral)   Resp 14   LMP  (LMP Unknown)   SpO2 92%  Gen:   Awake, no distress   Resp:  Normal effort  MSK:   Moves extremities without difficulty  Other:  TTP epigastrium  Medical Decision Making  Medically screening exam initiated at 11:02 AM.  Appropriate orders placed.  [name] was informed that the remainder of the evaluation will be completed by another provider, this initial triage assessment does not replace that evaluation, and the importance of remaining in the ED until their evaluation is complete.  Labs ordered   Margarita Mail, PA-C 03/13/21 [place], [place], DO 03/13/21 [id number]

## 2021-03-13 NOTE — ED Notes (Signed)
Attempted report 

## 2021-03-13 NOTE — ED Notes (Signed)
nephrology at bedside

## 2021-03-13 NOTE — H&P (Addendum)
History and Physical    [name] JJK:[id number] DOB: [birth year] DOA: 03/13/2021  PCP: [name], MD Consultants:  nephrology: she does not know her kidney doctor cardiologist: Dr. Terrence Dupont, GI: Dr. Abbey Chatters  Patient coming from:  Home - lives with her son.   Chief Complaint: nausea/vomiting and bloody stools.   HPI: [name] is a 75 y.o. female with medical history significant of ESRD on HD, HTN, HLD, PAF on Eliquis, GERD with esophagitis, OSA on BiPAP, colon polyps GERD, and prior history of COVID-19 pneumonia where hospital course complicated by upper GI bleed presents with complaints of nausea and vomiting and repetitive episodes of bloody bowel movements. She saw her GI doctor on 03/08/21. She went to dialysis on Friday and started to feels sick with nausea and vomiting and left after the 3rd hour. She was supposed to go back on Saturday, but missed this session as well. Saturday night she started to throw up some and had dry heaves all day Sunday and Monday night. She got up Monday morning and couldn't eat anything and was only to eat a few potato chips. She also has continued to have bloody stools since her hospital discharge a few weeks ago. She states the blood is bright red per rectum. She has some lightheadedness at times. No chest pain, does have shortness of breath at times. She has no swelling in legs. No stomach pain. She denies any fever, chills or coughing. NO urinary complaints, still makes some urine.  She does not drink alcohol. On eliquis.   She has taken her medication for her nausea and vomiting at home.   Last colonoscopy: 02/2020. No hx of colon cancer. Does have hx of colon cancer in her brothers.  EGD: 02/2021. Was hospitalized in July for similar complaints. Underwent EGD on 7/25. Schatzkis' ring s/p esophageal dilation. Known large hiatal hernia.  Continued her PPI BID and was given carafate x 14 days which she completed today.   Tuesday, Thursday and  Saturday. She missed her dialysis today due to being in ER.   ED Course: vitals: afebrile, bp: 127/40, HR: 95, RR: 14, oxygen: 95% RA.  Pertinent labs: wbc: 15.6, hgb: 9.4, potassium: 6.6, bun: 57, creatinine: 8.49, albumin: 2.0. grossly positive blood in stool on exam per EDP. Nephrology consulted in ED and will likely dialyze tomorrow. Given lokelma, novolog and dextrose 50 ampule for hyperkalemia. No changes on ekg. Asked to admit.   Review of Systems: As per HPI; otherwise review of systems reviewed and negative.   Ambulatory Status:  Ambulates with walker     Past Medical History:  Diagnosis Date   Blood transfusion without reported diagnosis    CAD (coronary artery disease)    Nonobstructive at cardiac catheterization 2000   Cataract    Cervical cancer ([place]) 1978   CHF (congestive heart failure) (HCC)    Chronic back pain    Chronic kidney disease    Class 2 obesity with body mass index (BMI) of 35 to 39.9 without comorbidity    Complication of anesthesia    hard to awaken with one back surgery - years ago, no problem since   COPD (chronic obstructive pulmonary disease) ([place])    Degenerative disc disease    DM (diabetes mellitus), type 2 with renal complications (HCC)    Dyslipidemia    Dyspnea    "due to COPD"   Dysrhythmia    a-fib   ESRD (end stage renal disease) on dialysis ([place])  [place]   GERD (gastroesophageal reflux disease)    Gout    Hiatal hernia 07/27/2013   History of diverticulitis of colon    History of hiatal hernia    HTN (hypertension)    09/09/20 not currently on medication, is on medication for hypotension   Hypotension    Iron deficiency anemia    Irritable bowel syndrome    Lumbar radiculopathy    Mixed hyperlipidemia    Moderate major depression, single episode ([place]) 07/21/2019   Neuropathy    OSA (obstructive sleep apnea)    Osteoporosis    Ovarian cancer ([place]) 1978   patient denies. States this was cervical cancer    Oxygen deficiency    room air   PAF (paroxysmal atrial fibrillation) (HCC)    Pneumonia    Sleep apnea    bipap - oxygen 2 l to bipap.   Type 2 diabetes mellitus ([place])    Vitamin B deficiency 12/25/2009   Vitamin B12 deficiency     Past Surgical History:  Procedure Laterality Date   ABDOMINAL HYSTERECTOMY     APPLICATION OF WOUND VAC Right 01/15/2021   Procedure: APPLICATION OF WOUND VAC;  Surgeon: Cherre Robins, MD;  Location: [place];  Service: Vascular;  Laterality: Right;   AV FISTULA PLACEMENT Left 08/05/2019   Procedure: ARTERIOVENOUS (AV) FISTULA CREATION LEFT ARM;  Surgeon: Waynetta Sandy, MD;  Location: [place];  Service: Vascular;  Laterality: Left;   AV FISTULA PLACEMENT Left 09/11/2020   Procedure: INSERTION OF ARTERIOVENOUS (AV) GORE-TEX GRAFT ARM LEFT;  Surgeon: Cherre Robins, MD;  Location: [place];  Service: Vascular;  Laterality: Left;   AV FISTULA PLACEMENT Right 11/29/2020   Procedure: Creation of Arteriovenous Fistula Right Upper Arm;  Surgeon: Rosetta Posner, MD;  Location: [place];  Service: Vascular;  Laterality: Right;   [place] Left 10/05/2019   Procedure: SECOND STAGE LEFT BASCILIC VEIN TRANSPOSITION;  Surgeon: Waynetta Sandy, MD;  Location: [place];  Service: Vascular;  Laterality: Left;   [place] Right 01/15/2021   Procedure: RIGHT UPPER ARM SECOND STAGE [place];  Surgeon: Cherre Robins, MD;  Location: [place];  Service: Vascular;  Laterality: Right;  PERIPHERAL NERVE BLOCK   Benign breast cysts     BIOPSY  02/26/2021   Procedure: BIOPSY;  Surgeon: Jackquline Denmark, MD;  [facility] ENDOSCOPY;  Service: Endoscopy;;   CHOLECYSTECTOMY     COLONOSCOPY  10/01/2006   SLF:Pan colonic diverticulosis and moderate internal hemorrhoids/ Otherwise no polyps, masses, inflammatory changes or AVMs/   COLONOSCOPY  2011   SLF: pancolonic diverticulosis, large internal hemorrhoids    COLONOSCOPY N/A 01/26/2016   Procedure: COLONOSCOPY;  Surgeon: [name], MD;  [facility];  Service: Endoscopy;  Laterality: N/A;  830    COLONOSCOPY WITH PROPOFOL N/A 02/10/2020   Procedure: COLONOSCOPY WITH PROPOFOL;  Surgeon: Daneil Dolin, MD;  [facility];  Service: Endoscopy;  Laterality: N/A;  10:45am   ESOPHAGEAL DILATION  02/26/2021   Procedure: ESOPHAGEAL DILATION;  Surgeon: Jackquline Denmark, MD;  Location: [facility] ENDOSCOPY;  Service: Endoscopy;;   ESOPHAGOGASTRODUODENOSCOPY  11/19/2006   SLF: Large hiatal hernia without evidence of Cameron ulcers/. Distal esophageal stricture, which allowed the gastroscope to pass without resistance.  A 16 mm Savary later passed with mild resistance/ Normal stomach.sb bx negative   ESOPHAGOGASTRODUODENOSCOPY  10/01/2006   TOI:ZTIWP hiatal hernia.  Distal esophagus without evidence of   erythema, ulceration or Barrett's esophagus   ESOPHAGOGASTRODUODENOSCOPY  2011   SLF: large hh, distal esophageal web narrowing to 79mm s/p dilation to 2mm   ESOPHAGOGASTRODUODENOSCOPY N/A 08/06/2013   SLF: 1. Stricture at the gastroesophageal junction 2. large hiatal hernia. 3. Mild erosive gastritis.   ESOPHAGOGASTRODUODENOSCOPY (EGD) WITH PROPOFOL N/A 07/19/2019   rourk: Mild erosive reflux esophagitis.  Mild Schatzki ring status post dilation.  Large hiatal hernia with at least one half of the stomach above the diaphragm.  Gastric mucosa erythematous.   ESOPHAGOGASTRODUODENOSCOPY (EGD) WITH PROPOFOL N/A 02/26/2021   Procedure: ESOPHAGOGASTRODUODENOSCOPY (EGD) WITH PROPOFOL;  Surgeon: Jackquline Denmark, MD;  Location: [facility] ENDOSCOPY;  Service: Endoscopy;  Laterality: N/A;   GIVENS CAPSULE STUDY N/A 08/06/2013   INCOMPLETE-SMALL BOWLE ULCERS   IR FLUORO GUIDE CV LINE LEFT  07/29/2019   IR US GUIDE VASC ACCESS LEFT  07/29/2019   KNEE SURGERY Right    PARTIAL HYSTERECTOMY  1978   PORT-A-CATH REMOVAL Right 11/29/2020   Procedure: REMOVAL OF RIGHT CHEST  PORT;  Surgeon: Rosetta Posner, MD;  Location: [place];  Service: Vascular;  Laterality: Right;   small bowel capsule  2008   negative   SPINE SURGERY     TONSILLECTOMY AND ADENOIDECTOMY     Two back surgeries/fusion     UMBILICAL HERNIA REPAIR  2010   UPPER EXTREMITY VENOGRAPHY N/A 11/22/2020   Procedure: UPPER EXTREMITY VENOGRAPHY;  Surgeon: Cherre Robins, MD;  Location: [place] [facility];  Service: Cardiovascular;  Laterality: N/A;    Social History   Socioeconomic History   Marital status: Widowed    Spouse name: Bonano   Number of children: 4   Years of education: Not on file   Highest education level: 10th grade  Occupational History   Occupation: retired    Comment: Ambulance person, [place]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 Lives in home with Rone - married 13 Y Cook, sew, quilt, crochet   Social Determinants of Health   Financial Resource Strain: Not on file  Food Insecurity: Not on file  Transportation Needs: Not on file  Physical Activity: Not on file  Stress: Not on file  Social Connections: Not on file  Intimate Partner Violence: Not on file    No Known Allergies  Family History  Problem Relation Age of Onset   Colon cancer Brother        diagnosed age 86. Living.    Ulcers Sister    Diabetes Sister    Heart attack Sister    Kidney failure Sister    Stroke Sister    Ulcers Mother    Diabetes Mother     Heart attack Mother    Stroke Mother  Asthma Mother    Heart disease Mother    Cervical cancer Mother    Heart attack Brother    Heart disease Brother    Asthma Sister    Diabetes Brother    Stroke Maternal Grandmother    Heart attack Maternal Grandmother    Heart attack Other    Early death Father        MVA in his 59s    Prior to Admission medications   Medication Sig Start Date End Date Taking? Authorizing Provider  acetaminophen (TYLENOL) 325 MG tablet Take 650 mg by mouth daily as needed for headache (pain).   Yes [provider]  albuterol (PROVENTIL) (2.5 MG/3ML) 0.083% nebulizer solution Take 2.5 mg by nebulization 4 (four) times daily as needed for wheezing or shortness of breath.  12/22/19  Yes [provider]  allopurinol (ZYLOPRIM) 100 MG tablet Take 1 tablet (100 mg total) by mouth 2 (two) times daily. Patient taking differently: Take 100 mg by mouth at bedtime. 08/09/19  Yes Nita Sells, MD  amiodarone (PACERONE) 200 MG tablet Take 1 tablet (200 mg total) by mouth in the morning. 01/15/21  Yes Dagoberto Ligas, PA-C  apixaban (ELIQUIS) 2.5 MG TABS tablet Take 2.5 mg by mouth 2 (two) times daily.   Yes [provider]  Biotin 5000 MCG TABS Take 5,000 mcg by mouth in the morning.   Yes [provider]  Camphor-Menthol-Methyl Sal (SALONPAS) 3.08-10-08 % PTCH Place 1 patch onto the skin daily as needed (pain).   Yes [provider]  Carboxymethylcellul-Glycerin (CLEAR EYES FOR DRY EYES) 1-0.25 % SOLN Place 1 drop into both eyes daily as needed (dry eys).   Yes [provider]  Cholecalciferol (VITAMIN D3) 50 MCG (2000 UT) TABS Take 2,000 Units by mouth in the morning.   Yes [provider]  esomeprazole (NEXIUM) 40 MG capsule Take 1 capsule (40 mg total) by mouth 2 (two) times daily before a meal. 02/28/21  Yes Lavina Hamman, MD  famotidine (PEPCID) 20 MG tablet Take 20 mg by mouth every evening.   Yes  [provider]  ferric citrate (AURYXIA) 1 GM 210 MG(Fe) tablet Take 210 mg by mouth 3 (three) times daily with meals.   Yes [provider]  fluticasone (FLONASE) 50 MCG/ACT nasal spray Place 1 spray into both nostrils daily. 02/06/21  Yes [provider]  Fluticasone-Umeclidin-Vilant (TRELEGY ELLIPTA) 100-62.5-25 MCG/INH AEPB Inhale 1 puff into the lungs daily at 12 noon.   Yes [provider]  gabapentin (NEURONTIN) 100 MG capsule Take 100 mg by mouth at bedtime. 12/29/19  Yes [provider]  nitroGLYCERIN (NITROSTAT) 0.4 MG SL tablet Place 0.4 mg under the tongue every 5 (five) minutes x 3 doses as needed for chest pain. 12/07/19  Yes [provider]  ondansetron (ZOFRAN) 4 MG tablet Take 4 mg by mouth every 8 (eight) hours as needed for nausea/vomiting, nausea or vomiting. 12/29/19  Yes [provider]  promethazine (PHENERGAN) 12.5 MG tablet Take 1 tablet (12.5 mg total) by mouth every 8 (eight) hours as needed for nausea or vomiting. 03/08/21 04/07/21 Yes Carver, Elon Alas, DO  atorvastatin (LIPITOR) 40 MG tablet Take 40 mg by mouth daily in the afternoon. In the afternoon 01/15/21   Dagoberto Ligas, PA-C  midodrine (PROAMATINE) 10 MG tablet Take 1 tablet (10 mg total) by mouth 3 (three) times daily with meals. Patient not taking: Reported on 03/08/2021 04/28/20   Geradine Girt, DO  Nutritional Supplements (FEEDING SUPPLEMENT, NEPRO CARB STEADY,) LIQD Take 237 mLs by mouth 2 (two) times daily between meals. Patient not taking: Reported on 03/08/2021 02/28/21   Lavina Hamman, MD  oxyCODONE-acetaminophen (PERCOCET) 5-325 MG tablet Take 1 tablet by mouth every 6 (six) hours as needed for severe pain. Patient not taking: Reported on 03/08/2021 01/15/21   Dagoberto Ligas, PA-C  OXYGEN Inhale 2 L into the lungs See admin instructions. Use every night and as needed during the day    [provider]  [place] into the  lungs at bedtime. Bipap    [provider]  sucralfate (CARAFATE) 1 GM/10ML suspension Take 10 mLs (1 g total) by mouth 2 (two) times daily for 14 days. 02/28/21 03/14/21  Lavina Hamman, MD    Physical Exam: Vitals:   03/13/21 1352 03/13/21 1435 03/13/21 1500 03/13/21 1530  BP: (!) 129/39 135/66 116/90 (!) 128/44  Pulse: 76 (!) 102 87   Resp: 14 13 (!) 22 14  Temp:      TempSrc:      SpO2: 99% 97% 94%   Weight: 74 kg     Height: 4\' 8"  (1.422 m)        General:  Appears calm and comfortable and is in NAD. Slightly pale Eyes:  PERRL, EOMI, normal lids, iris ENT:  grossly normal hearing, lips & tongue, mmm; appropriate dentition Neck:  no LAD, masses or thyromegaly; no carotid bruits Cardiovascular:  RRR, no m/r/g. Trace LE edema.  Respiratory:   CTA bilaterally with no wheezes/rales/rhonchi.  Normal respiratory effort. Abdomen:  soft, NT, ND, NABS Back:   normal alignment, no CVAT Skin:  no rash or induration seen on limited exam. Fistula in right upper arm.  Musculoskeletal:  grossly normal tone BUE/BLE, good ROM, no bony abnormality Lower extremity:  No LE edema.  Limited foot exam with no ulcerations.  2+ distal pulses. Psychiatric:  grossly normal mood and affect, speech fluent and appropriate, AOx3 Neurologic:  CN 2-12 grossly intact, moves all extremities in coordinated fashion, sensation intact. Resting tremor of right hand.    Radiological Exams on Admission: Independently reviewed - see discussion in A/P where applicable  No results found.  EKG: Independently reviewed.  NSR with rate 82 and with sinus arrhythmia, new since previous ekg. T waves with some peaking; nonspecific ST changes with no evidence of acute ischemia   Labs on Admission: I have personally reviewed the available labs and imaging studies at the time of the admission.  Pertinent labs:  wbc: 15.6,  hgb: 9.4, (9.4 on discharge 2 weeks ago) --->8.6 potassium: 6.6-->6.0-->repeat pending   Bun: 57,  creatinine: 8.49,  albumin: 2.0.  grossly positive blood in stool on exam per EDP.    Assessment/Plan Principal Problem:   GI bleed -+gross blood on rectal exam with recent admission for GI bleed with EGD.  -hgb trending downward GI consulted Follow recommendations and appreciate consult.  Serial H&H and transfuse per protocol Holding eliquis and DVT ppx. SCDs PPI BID via IV  Active Problems:   Intractable nausea and vomiting -has felt better in ER and when I saw her after receiving IV zofran -will continue zofran PRN    Hyperkalemia Nephrology consulted with plans for dialysis tomorrow Given Lokelma insulin and dextrose in ER with potassium that came down to 6 she was then given Kayexalate and calcium with repeat potassium pending    Hiatal hernia with GERD and esophagitis At risk for gastric volvulus.  She has no stomach pain or fever but with increasing white count I did check an abdominal x-ray this is pending. On Protonix drip Has been following outpatient with new GI doctor  ESRD (end stage renal disease) on dialysis [facility]) Nephrology consulted in ER and plans on dialysis tomorrow  Dialysis Tuesday Thursday Saturday she did miss her session today due to not feeling well  Leukocytosis -Has history of elevated white count ranging from 11-12 over the past 2 weeks but was previously within normal limits She has had no fever or other complaints or clinical findings suspicious for infection She is at high risk of gastric volvulus so abdominal x-ray has been ordered Question if more reactive We will continue to trend and follow her clinically    PAF (paroxysmal atrial fibrillation) ([place]) Rate controlled we will continue her home amiodarone Holding Eliquis secondary to GI bleed    DM type 2 with diabetic peripheral neuropathy ([place]) Appears to be diet controlled Last A1c in march 2022 and was 5.1 Accu-Cheks with very sensitive renal sliding  scale  Hypotension Was on midodrine 3 times daily but this was held and she has not used it recently Continue to monitor her pressures    OSA (obstructive sleep apnea) Continue BiPAP at night  Chronic respiratory failure/ Chronic obstructive pulmonary disease ([place]) Has 2 L of nasal cannula oxygen that she uses as needed Oxygen saturation has been maintained on room air Continue home maintenance inhaler and albuterol nebs as needed for shortness of breath or wheezing   Chronic diastolic CHF (congestive heart failure) ([place]) Intake and output she does still make urine and daily weights Volume status per dialysis Last echo in December 2020: EF estimated to be 55 to 60% with normal LV function  Hyperlipemia Continue her home atorvastatin Last lipid panel from 2020 we will repeat a fasting lipid panel in the a.m.  Resting tremor Recommend outpatient neurology follow up  Body mass index is 36.58 kg/m.     Level of care: Telemetry Medical DVT prophylaxis:   SCDs Code Status:  Full - confirmed with patient Family Communication: None present; I attempted to call her son x2 with VM at the time of admission. [name]: [phone]  Disposition Plan:  The patient is from: home  Anticipated d/c is to: home   Requires inpatient hospitalization and is at significant risk of medically worsening, requires constant monitoring, assessment and MDM with specialists.  Patient is currently: acutely ill Consults called: nephrology by EDP  and GI  Admission status:  observation    Orma Flaming MD [facility]   How to contact the [facility] Attending or Consulting provider [place] or covering provider during after hours [place], for this patient?  Check the care team in [facility] and look for a) attending/consulting [facility] provider listed and b) the [facility] team listed Log into [email or web address] and use [place]'s universal password to access. If you do not have the password, please contact the hospital  operator. Locate the [facility] provider you are looking for under [facility] and page to a number that you can be directly reached. If you still have difficulty reaching the provider, please page the [facility] (Director on Call) for the Hospitalists listed on amion for assistance.   03/13/2021, 7:38 PM

## 2021-03-13 NOTE — Telephone Encounter (Signed)
Contacted [facility] and obtained approval for one year for the promethazine 12.5 mg tablets. Approval # [id number]. DX codes of R11.0 and R11.10 were used for approval.

## 2021-03-13 NOTE — Progress Notes (Signed)
RT set up patient's home BIPAP on bedside table with 2L O2 bled into circuit. Patient stated she will place herself on when she is ready. RT instructed patient to have RT called if assistance is needed. RN is aware. RT will monitor as needed.

## 2021-03-13 NOTE — Progress Notes (Signed)
[name] is a 75 Y/O female with ESRD on hemodialysis T,Th,S at [facility]. HEK:BTCY, T2DM, COPD, GI bleed, a flutter on Eliquis, OSA, CHF. Recent admission 07/24-07/27/2022 for intractable N & V, hematemesis, large HH, gastritis, Schatzki's ring S/P esophageal dilatation. She returns to ED today with C/O nausea, dry heaves and blood in stool. No HD since 03/09/2021. K+ noted to 6.6 SCr 8.49 BUN 57 CO2 28 HGB 9.4. She has rec'd Sodium bicarb, calcium gluconate, lokelma, D50w and insulin in ED. She has been admitted as observation patient for Hyperkalemia in setting of noncompliance with HD, blood in stool. We will order HD in AM.  Give additional lokelma and kayexalate tonight.   HD orders: [place] T,Th,S 4 hrs 180NRe 400/800 73 kg 2.0K/2.5 Ca  -No Heparin -Venofer 100 mg IV X 10 doses (4/10 dose given) -Mircera 100 mcg q 2 weeks (last 02/15/2021) -Calcitriol 1.25 mcg PO TIW   Juanell Fairly [facility] [phone]

## 2021-03-13 NOTE — Progress Notes (Signed)
Viewed pt MAR, pt did not receive IV calcium gluconate or sodium bicarb or Kayexalate in the ER,  confirmed with RN Burman Nieves that worked with the pt in the ER before coming up to [place]  ---that she did not give these, as Dr Jonnie Finner ordered them right before pt was transferred up to unit 5MW.   ER RN Burman Nieves did give lokelma, D50 and Novolog insulin in the ER. Maggie,RN  stated she had given the meds 10 minutes before MD requested a recheck on K+, at that time pt was 6.0 on her K+ and more meds were ordered.   discussed with Dr Wynn Banker and with the above verification, he wants  calcium gluconate, sodium bicarb and kayexalate given.

## 2021-03-13 NOTE — Telephone Encounter (Signed)
Returned the call of the pt's son [name]) and was advised that when he called the pharmacy the staff advised him that they had sent [place] PA over the weekend and he was wondering/ did they really do it. So I called the pharmacy and they faxed over a PA for the pt's Phenergan. I will notify pt once approved.

## 2021-03-13 NOTE — ED Notes (Signed)
PA made aware of K+ level.

## 2021-03-14 ENCOUNTER — Encounter (HOSPITAL_COMMUNITY): Payer: Self-pay | Admitting: Family Medicine

## 2021-03-14 ENCOUNTER — Inpatient Hospital Stay (HOSPITAL_COMMUNITY): Payer: Medicare Other

## 2021-03-14 DIAGNOSIS — G4733 Obstructive sleep apnea (adult) (pediatric): Secondary | ICD-10-CM | POA: Diagnosis present

## 2021-03-14 DIAGNOSIS — Z6836 Body mass index (BMI) 36.0-36.9, adult: Secondary | ICD-10-CM | POA: Diagnosis not present

## 2021-03-14 DIAGNOSIS — K449 Diaphragmatic hernia without obstruction or gangrene: Secondary | ICD-10-CM | POA: Diagnosis not present

## 2021-03-14 DIAGNOSIS — Z992 Dependence on renal dialysis: Secondary | ICD-10-CM

## 2021-03-14 DIAGNOSIS — E669 Obesity, unspecified: Secondary | ICD-10-CM | POA: Diagnosis present

## 2021-03-14 DIAGNOSIS — E875 Hyperkalemia: Secondary | ICD-10-CM | POA: Diagnosis present

## 2021-03-14 DIAGNOSIS — J9610 Chronic respiratory failure, unspecified whether with hypoxia or hypercapnia: Secondary | ICD-10-CM | POA: Diagnosis present

## 2021-03-14 DIAGNOSIS — Z8616 Personal history of COVID-19: Secondary | ICD-10-CM | POA: Diagnosis not present

## 2021-03-14 DIAGNOSIS — K2100 Gastro-esophageal reflux disease with esophagitis, without bleeding: Secondary | ICD-10-CM

## 2021-03-14 DIAGNOSIS — E1122 Type 2 diabetes mellitus with diabetic chronic kidney disease: Secondary | ICD-10-CM | POA: Diagnosis present

## 2021-03-14 DIAGNOSIS — K5732 Diverticulitis of large intestine without perforation or abscess without bleeding: Secondary | ICD-10-CM | POA: Diagnosis present

## 2021-03-14 DIAGNOSIS — I959 Hypotension, unspecified: Secondary | ICD-10-CM | POA: Diagnosis present

## 2021-03-14 DIAGNOSIS — E1143 Type 2 diabetes mellitus with diabetic autonomic (poly)neuropathy: Secondary | ICD-10-CM | POA: Diagnosis present

## 2021-03-14 DIAGNOSIS — Z20822 Contact with and (suspected) exposure to covid-19: Secondary | ICD-10-CM | POA: Diagnosis present

## 2021-03-14 DIAGNOSIS — R0609 Other forms of dyspnea: Secondary | ICD-10-CM | POA: Diagnosis not present

## 2021-03-14 DIAGNOSIS — J9811 Atelectasis: Secondary | ICD-10-CM | POA: Diagnosis present

## 2021-03-14 DIAGNOSIS — R112 Nausea with vomiting, unspecified: Secondary | ICD-10-CM

## 2021-03-14 DIAGNOSIS — J449 Chronic obstructive pulmonary disease, unspecified: Secondary | ICD-10-CM | POA: Diagnosis present

## 2021-03-14 DIAGNOSIS — I480 Paroxysmal atrial fibrillation: Secondary | ICD-10-CM | POA: Diagnosis present

## 2021-03-14 DIAGNOSIS — N186 End stage renal disease: Secondary | ICD-10-CM

## 2021-03-14 DIAGNOSIS — D631 Anemia in chronic kidney disease: Secondary | ICD-10-CM

## 2021-03-14 DIAGNOSIS — I5032 Chronic diastolic (congestive) heart failure: Secondary | ICD-10-CM | POA: Diagnosis present

## 2021-03-14 DIAGNOSIS — E1142 Type 2 diabetes mellitus with diabetic polyneuropathy: Secondary | ICD-10-CM | POA: Diagnosis present

## 2021-03-14 DIAGNOSIS — R1110 Vomiting, unspecified: Secondary | ICD-10-CM

## 2021-03-14 DIAGNOSIS — K922 Gastrointestinal hemorrhage, unspecified: Secondary | ICD-10-CM | POA: Diagnosis not present

## 2021-03-14 DIAGNOSIS — K5792 Diverticulitis of intestine, part unspecified, without perforation or abscess without bleeding: Secondary | ICD-10-CM | POA: Diagnosis not present

## 2021-03-14 DIAGNOSIS — E11649 Type 2 diabetes mellitus with hypoglycemia without coma: Secondary | ICD-10-CM | POA: Diagnosis not present

## 2021-03-14 DIAGNOSIS — I132 Hypertensive heart and chronic kidney disease with heart failure and with stage 5 chronic kidney disease, or end stage renal disease: Secondary | ICD-10-CM | POA: Diagnosis present

## 2021-03-14 DIAGNOSIS — K921 Melena: Principal | ICD-10-CM

## 2021-03-14 DIAGNOSIS — D62 Acute posthemorrhagic anemia: Secondary | ICD-10-CM | POA: Diagnosis present

## 2021-03-14 LAB — CBC
HCT: 23.4 % — ABNORMAL LOW (ref 36.0–46.0)
HCT: 23.2 % — ABNORMAL LOW (ref 36.0–46.0)
Hemoglobin: 7.4 g/dL — ABNORMAL LOW (ref 12.0–15.0)
Hemoglobin: 7.4 g/dL — ABNORMAL LOW (ref 12.0–15.0)
MCH: 28.9 pg (ref 26.0–34.0)
MCH: 28.9 pg (ref 26.0–34.0)
MCHC: 31.6 g/dL (ref 30.0–36.0)
MCHC: 31.9 g/dL (ref 30.0–36.0)
MCV: 91.4 fL (ref 80.0–100.0)
MCV: 90.6 fL (ref 80.0–100.0)
Platelets: 379 10*3/uL (ref 150–400)
Platelets: 316 10*3/uL (ref 150–400)
RBC: 2.56 MIL/uL — ABNORMAL LOW (ref 3.87–5.11)
RBC: 2.56 MIL/uL — ABNORMAL LOW (ref 3.87–5.11)
RDW: 15.7 % — ABNORMAL HIGH (ref 11.5–15.5)
RDW: 15.7 % — ABNORMAL HIGH (ref 11.5–15.5)
WBC: 20 10*3/uL — ABNORMAL HIGH (ref 4.0–10.5)
WBC: 19.3 10*3/uL — ABNORMAL HIGH (ref 4.0–10.5)
nRBC: 0 % (ref 0.0–0.2)
nRBC: 0 % (ref 0.0–0.2)

## 2021-03-14 LAB — HEMOGLOBIN AND HEMATOCRIT, BLOOD
HCT: 22.4 % — ABNORMAL LOW (ref 36.0–46.0)
HCT: 22.9 % — ABNORMAL LOW (ref 36.0–46.0)
Hemoglobin: 7.4 g/dL — ABNORMAL LOW (ref 12.0–15.0)
Hemoglobin: 7.4 g/dL — ABNORMAL LOW (ref 12.0–15.0)

## 2021-03-14 LAB — LIPID PANEL
Cholesterol: 115 mg/dL (ref 0–200)
HDL: 40 mg/dL — ABNORMAL LOW (ref >40)
LDL Cholesterol: 60 mg/dL (ref 0–99)
Total CHOL/HDL Ratio: 2.9 RATIO
Triglycerides: 75 mg/dL (ref <150)
VLDL: 15 mg/dL (ref 0–40)

## 2021-03-14 LAB — RENAL FUNCTION PANEL
Albumin: 1.6 g/dL — ABNORMAL LOW (ref 3.5–5.0)
Anion gap: 13 (ref 5–15)
BUN: 63 mg/dL — ABNORMAL HIGH (ref 8–23)
CO2: 29 mmol/L (ref 22–32)
Calcium: 8.7 mg/dL — ABNORMAL LOW (ref 8.9–10.3)
Chloride: 97 mmol/L — ABNORMAL LOW (ref 98–111)
Creatinine, Ser: 9.46 mg/dL — ABNORMAL HIGH (ref 0.44–1.00)
GFR, Estimated: 4 mL/min — ABNORMAL LOW (ref >60)
Glucose, Bld: 115 mg/dL — ABNORMAL HIGH (ref 70–99)
Phosphorus: 4.2 mg/dL (ref 2.5–4.6)
Potassium: 5.6 mmol/L — ABNORMAL HIGH (ref 3.5–5.1)
Sodium: 139 mmol/L (ref 135–145)

## 2021-03-14 LAB — GLUCOSE, CAPILLARY
Glucose-Capillary: 99 mg/dL (ref 70–99)
Glucose-Capillary: 85 mg/dL (ref 70–99)
Glucose-Capillary: 80 mg/dL (ref 70–99)
Glucose-Capillary: 81 mg/dL (ref 70–99)

## 2021-03-14 LAB — OCCULT BLOOD X 1 CARD TO LAB, STOOL: Fecal Occult Bld: POSITIVE — AB

## 2021-03-14 MED ORDER — ONDANSETRON HCL 4 MG PO TABS
4.0000 mg | ORAL_TABLET | Freq: Three times a day (TID) | ORAL | Status: DC
  Administered 2021-03-14 – 2021-03-18 (×13): 4 mg via ORAL
  Filled 2021-03-14 (×13): qty 1

## 2021-03-14 MED ORDER — MIDODRINE HCL 5 MG PO TABS
ORAL_TABLET | ORAL | Status: AC
  Administered 2021-03-14: 10 mg via ORAL
  Filled 2021-03-14: qty 2

## 2021-03-14 MED ORDER — PENTAFLUOROPROP-TETRAFLUOROETH EX AERO: 1.0000 "application " | INHALATION_SPRAY | CUTANEOUS | Status: DC | PRN

## 2021-03-14 MED ORDER — LIDOCAINE-PRILOCAINE 2.5-2.5 % EX CREA: 1.0000 "application " | TOPICAL_CREAM | CUTANEOUS | Status: DC | PRN

## 2021-03-14 MED ORDER — PANTOPRAZOLE SODIUM 40 MG IV SOLR
40.0000 mg | Freq: Every day | INTRAVENOUS | Status: DC
  Administered 2021-03-15 – 2021-03-18 (×4): 40 mg via INTRAVENOUS
  Filled 2021-03-14 (×5): qty 40

## 2021-03-14 MED ORDER — LIDOCAINE HCL (PF) 1 % IJ SOLN: 5.0000 mL | INTRAMUSCULAR | Status: DC | PRN

## 2021-03-14 MED ORDER — SODIUM CHLORIDE 0.9 % IV SOLN: 100.0000 mL | INTRAVENOUS | Status: DC | PRN

## 2021-03-14 MED ORDER — ACETAMINOPHEN 500 MG PO TABS
500.0000 mg | ORAL_TABLET | Freq: Four times a day (QID) | ORAL | Status: DC | PRN
  Administered 2021-03-14: 500 mg via ORAL
  Filled 2021-03-14: qty 1

## 2021-03-14 MED ORDER — HEPARIN SODIUM (PORCINE) 1000 UNIT/ML DIALYSIS: 1000.0000 [IU] | INTRAMUSCULAR | Status: DC | PRN

## 2021-03-14 MED ORDER — MIDODRINE HCL 5 MG PO TABS
10.0000 mg | ORAL_TABLET | Freq: Three times a day (TID) | ORAL | Status: DC
  Administered 2021-03-14 – 2021-03-18 (×11): 10 mg via ORAL
  Filled 2021-03-14 (×12): qty 2

## 2021-03-14 MED ORDER — ALTEPLASE 2 MG IJ SOLR: 2.0000 mg | Freq: Once | INTRAMUSCULAR | Status: DC | PRN

## 2021-03-14 NOTE — Consult Note (Addendum)
Attending physician's note   I have taken an interval history, reviewed the chart and examined the patient. I agree with the Advanced Practitioner's note, impression, and recommendations as outlined.   75 yo female with medical history as outlined below to include ESRD on HD, CAD, A. fib (on Eliquis), diabetes, anemia of chronic disease, COPD, OSA on CPAP.  GI service consulted to evaluate for nausea/vomiting along with reported BRBPR.  1) Nausea/Vomiting - Was recently evaluated in [facility] for same symptoms.  Multiple potential etiologies to include large hiatal hernia, possible gastroparesis (diabetes) - Plan for scheduled antiemetics with additional antiemetics for breakthrough - Recommend low-fat, low fiber diet to start - Encouraged p.o. intake as tolerated - Continue PPI as outlined for history of reflux with large HH - Could consider CT head  2) BRBPR 3) History of diverticulosis - Reported 2 episodes of BRBPR prior to admission.  None since hospital admission - Observe for continued over bleeding - Based on elevated periprocedural risks, significant underlying comorbidities, and no continued bleeding, recency of last colonoscopy (02/2020; diverticulosis, no polyps), holding off on bowel prep and colonoscopy this juncture. - Continue serial H/H checks - Holding Eliquis for now  4) Acute on chronic anemia - Admission Hgb 7.4 from baseline ~9.5 - Continue serial H/H checks - More conservative management at this juncture  5) ESRD - HD completed earlier today  I had an extensive conversation with her son, [name], by phone this afternoon. Multiple questions answered regarding his mother's inpatient care.   GI service will continue to follow.   [address] DO, [place] [phone] office            Consultation  Referring Provider: TR[facility]/Alekh  Primary Care Physician:  [name], MD Primary Gastroenterologist: Althia Forts, prior Dr. Gala Romney > DR  Abbey Chatters  Reason for Consultation: Lower GI bleeding  HPI: [name] is a 75 y.o. female with history of end-stage renal disease, on dialysis, coronary artery disease, atrial fibrillation with chronic Eliquis use, chronic anemia, COPD, sleep apnea, and history of COVID-pneumonia. We are asked to see for GI bleeding.  She has had multiple endoscopic evaluations in the past. She most recently underwent EGD on 02/26/2021 f here per Dr. Lyndel Safe for a 2 g drop in hemoglobin and reported dark stools.  That showed a large hiatal hernia, Schatzki's ring which was dilated, there is a small GE junction nodule biopsied and moderate gastritis.  There was some retained food limiting exam no active bleeding.  She was continued on Nexium twice daily Carafate twice daily x14 days and Pepcid at bedtime.  She resumed her Eliquis at the time of discharge. Last Colonoscopy done about a year ago on 02/10/2020 for surveillance of prior adenomatous polyps she was noted to have a redundant colon with diverticulosis throughout the colon, no recurrent polyps. She was seen at [facility] about 5 days ago for persistent nausea, was given Phenergan.  There has been discussion about referral for hiatal hernia repair however she is a poor surgical candidate. Per notes she went to dialysis this past Friday and started to feel sick with nausea and vomiting and did not complete her session.  She was supposed to go back Saturday but missed that session as well, she had dry heaves through the weekend and apparently was not able to eat anything on Monday.  The history is somewhat unclear about the rectal bleeding.  Patient says that she had 2 episodes yesterday that seemed like "right  much" in the commode.  She cannot recall whether she had any bleeding earlier in the week.  She says she has been having a lot of problems with nausea.  Is not having any current abdominal pain, she is complaining of a headache which she says she has been  getting frequently and is worse since she had dialysis this morning.  She is a poor historian currently.  Nursing confirms that she has not had any further bowel movement since admission to the hospital. She did not take her Eliquis yesterday by her report.  LFTs normal on admission BUN 57 creatinine 8.49 on admit/potassium 6.6 ,  Hemoglobin 9.42 weeks ago ,on admission down to 7.4 today.  She has just completed dialysis this morning.  Baseline hemoglobin around 9.5.  CT of the abdomen and pelvis in September 2021 shows patient to be status postcholecystectomy, she has a large hiatal hernia with the majority of the stomach within the chest.   Past Medical History:  Diagnosis Date   Blood transfusion without reported diagnosis    CAD (coronary artery disease)    Nonobstructive at cardiac catheterization 2000   Cataract    Cervical cancer ([place]) 1978   CHF (congestive heart failure) (HCC)    Chronic back pain    Chronic kidney disease    Class 2 obesity with body mass index (BMI) of 35 to 39.9 without comorbidity    Complication of anesthesia    hard to awaken with one back surgery - years ago, no problem since   COPD (chronic obstructive pulmonary disease) ([place])    Degenerative disc disease    DM (diabetes mellitus), type 2 with renal complications ([place])    Dyslipidemia    Dyspnea    "due to COPD"   Dysrhythmia    a-fib   ESRD (end stage renal disease) on dialysis ([place])    T[facility]S- [address]   GERD (gastroesophageal reflux disease)    Gout    Hiatal hernia 07/27/2013   History of diverticulitis of colon    History of hiatal hernia    HTN (hypertension)    09/09/20 not currently on medication, is on medication for hypotension   Hypotension    Iron deficiency anemia    Irritable bowel syndrome    Lumbar radiculopathy    Mixed hyperlipidemia    Moderate major depression, 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 States this was cervical cancer   Oxygen deficiency    room air   PAF (paroxysmal atrial fibrillation) (HCC)    Pneumonia    Sleep apnea    bipap - oxygen 2 l to bipap.   Type 2 diabetes mellitus ([place])    Vitamin B deficiency 12/25/2009   Vitamin B12 deficiency     Past Surgical History:  Procedure Laterality Date   ABDOMINAL HYSTERECTOMY     APPLICATION OF WOUND VAC Right 01/15/2021   Procedure: APPLICATION OF WOUND VAC;  Surgeon: Cherre Robins, MD;  Location: [facility];  Service: Vascular;  Laterality: Right;   AV FISTULA PLACEMENT Left 08/05/2019   Procedure: ARTERIOVENOUS (AV) FISTULA CREATION LEFT ARM;  Surgeon: Waynetta Sandy, MD;  Location: [facility];  Service: Vascular;  Laterality: Left;   AV FISTULA PLACEMENT Left 09/11/2020   Procedure: INSERTION OF ARTERIOVENOUS (AV) GORE-TEX GRAFT ARM LEFT;  Surgeon: Cherre Robins, MD;  Location:  [facility];  Service: Vascular;  Laterality: Left;   AV FISTULA PLACEMENT Right 11/29/2020   Procedure: Creation of Arteriovenous Fistula Right Upper Arm;  Surgeon: Rosetta Posner, MD;  Location: [facility];  Service: Vascular;  Laterality: Right;   [place] Left 10/05/2019   Procedure: SECOND STAGE LEFT BASCILIC VEIN TRANSPOSITION;  Surgeon: Waynetta Sandy, MD;  Location: [facility];  Service: Vascular;  Laterality: Left;   [place] Right 01/15/2021   Procedure: RIGHT UPPER ARM SECOND STAGE [place];  Surgeon: Cherre Robins, MD;  Location: [facility];  Service: Vascular;  Laterality: Right;  PERIPHERAL NERVE BLOCK   Benign breast cysts     BIOPSY  02/26/2021   Procedure: BIOPSY;  Surgeon: Jackquline Denmark, MD;  Location: [facility] ENDOSCOPY;  Service: Endoscopy;;   CHOLECYSTECTOMY     COLONOSCOPY  10/01/2006   SLF:Pan colonic diverticulosis and moderate internal hemorrhoids/ Otherwise no polyps, masses, inflammatory changes or AVMs/    COLONOSCOPY  2011   SLF: pancolonic diverticulosis, large internal hemorrhoids   COLONOSCOPY N/A 01/26/2016   Procedure: COLONOSCOPY;  Surgeon: [name], MD;  [facility];  Service: Endoscopy;  Laterality: N/A;  830    COLONOSCOPY WITH PROPOFOL N/A 02/10/2020   Procedure: COLONOSCOPY WITH PROPOFOL;  Surgeon: Daneil Dolin, MD;  [facility];  Service: Endoscopy;  Laterality: N/A;  10:45am   ESOPHAGEAL DILATION  02/26/2021   Procedure: ESOPHAGEAL DILATION;  Surgeon: Jackquline Denmark, MD;  Location: [facility] ENDOSCOPY;  Service: Endoscopy;;   ESOPHAGOGASTRODUODENOSCOPY  11/19/2006   SLF: Large hiatal hernia without evidence of [place] ulcers/. Distal esophageal stricture, which allowed the gastroscope to pass without resistance.  A 16 mm Savary later passed with mild resistance/ Normal stomach.sb bx negative   ESOPHAGOGASTRODUODENOSCOPY  10/01/2006   WUJ:WJXBJ hiatal hernia.  Distal esophagus without evidence of   erythema, ulceration or Barrett's esophagus   ESOPHAGOGASTRODUODENOSCOPY  2011   SLF: large hh, distal esophageal web narrowing to 69mm s/p dilation to 77mm   ESOPHAGOGASTRODUODENOSCOPY N/A 08/06/2013   SLF: 1. Stricture at the gastroesophageal junction 2. large hiatal hernia. 3. Mild erosive gastritis.   ESOPHAGOGASTRODUODENOSCOPY (EGD) WITH PROPOFOL N/A 07/19/2019   rourk: Mild erosive reflux esophagitis.  Mild Schatzki ring status post dilation.  Large hiatal hernia with at least one half of the stomach above the diaphragm.  Gastric mucosa erythematous.   ESOPHAGOGASTRODUODENOSCOPY (EGD) WITH PROPOFOL N/A 02/26/2021   Procedure: ESOPHAGOGASTRODUODENOSCOPY (EGD) WITH PROPOFOL;  Surgeon: Jackquline Denmark, MD;  Location: [facility] ENDOSCOPY;  Service: Endoscopy;  Laterality: N/A;   GIVENS CAPSULE STUDY N/A 08/06/2013   INCOMPLETE-SMALL BOWLE ULCERS   IR FLUORO GUIDE CV LINE LEFT  07/29/2019   IR US GUIDE VASC ACCESS LEFT  07/29/2019   KNEE SURGERY Right    PARTIAL HYSTERECTOMY   1978   PORT-A-CATH REMOVAL Right 11/29/2020   Procedure: REMOVAL OF RIGHT CHEST PORT;  Surgeon: Rosetta Posner, MD;  Location: [facility];  Service: Vascular;  Laterality: Right;   small bowel capsule  2008   negative   SPINE SURGERY     TONSILLECTOMY AND ADENOIDECTOMY     Two back surgeries/fusion     UMBILICAL HERNIA REPAIR  2010   UPPER EXTREMITY VENOGRAPHY N/A 11/22/2020   Procedure: UPPER EXTREMITY VENOGRAPHY;  Surgeon: Cherre Robins, MD;  Location: [facility];  Service: Cardiovascular;  Laterality: N/A;    Prior to Admission medications   Medication  Sig Start Date End Date Taking? Authorizing Provider  acetaminophen (TYLENOL) 325 MG tablet Take 650 mg by mouth daily as needed for headache (pain).   Yes [provider]  albuterol (PROVENTIL) (2.5 MG/3ML) 0.083% nebulizer solution Take 2.5 mg by nebulization 4 (four) times daily as needed for wheezing or shortness of breath.  12/22/19  Yes [provider]  allopurinol (ZYLOPRIM) 100 MG tablet Take 1 tablet (100 mg total) by mouth 2 (two) times daily. Patient taking differently: Take 100 mg by mouth at bedtime. 08/09/19  Yes Nita Sells, MD  amiodarone (PACERONE) 200 MG tablet Take 1 tablet (200 mg total) by mouth in the morning. 01/15/21  Yes Dagoberto Ligas, PA-C  apixaban (ELIQUIS) 2.5 MG TABS tablet Take 2.5 mg by mouth 2 (two) times daily.   Yes [provider]  Biotin 5000 MCG TABS Take 5,000 mcg by mouth in the morning.   Yes [provider]  Camphor-Menthol-Methyl Sal (SALONPAS) 3.08-10-08 % PTCH Place 1 patch onto the skin daily as needed (pain).   Yes [provider]  Carboxymethylcellul-Glycerin (CLEAR EYES FOR DRY EYES) 1-0.25 % SOLN Place 1 drop into both eyes daily as needed (dry eys).   Yes [provider]  Cholecalciferol (VITAMIN D3) 50 MCG (2000 UT) TABS Take 2,000 Units by mouth in the morning.   Yes [provider]  esomeprazole (NEXIUM) 40 MG  capsule Take 1 capsule (40 mg total) by mouth 2 (two) times daily before a meal. 02/28/21  Yes Lavina Hamman, MD  famotidine (PEPCID) 20 MG tablet Take 20 mg by mouth every evening.   Yes [provider]  ferric citrate (AURYXIA) 1 GM 210 MG(Fe) tablet Take 210 mg by mouth 3 (three) times daily with meals.   Yes [provider]  fluticasone (FLONASE) 50 MCG/ACT nasal spray Place 1 spray into both nostrils daily. 02/06/21  Yes [provider]  Fluticasone-Umeclidin-Vilant (TRELEGY ELLIPTA) 100-62.5-25 MCG/INH AEPB Inhale 1 puff into the lungs daily at 12 noon.   Yes [provider]  gabapentin (NEURONTIN) 100 MG capsule Take 100 mg by mouth at bedtime. 12/29/19  Yes [provider]  nitroGLYCERIN (NITROSTAT) 0.4 MG SL tablet Place 0.4 mg under the tongue every 5 (five) minutes x 3 doses as needed for chest pain. 12/07/19  Yes [provider]  ondansetron (ZOFRAN) 4 MG tablet Take 4 mg by mouth every 8 (eight) hours as needed for nausea/vomiting, nausea or vomiting. 12/29/19  Yes [provider]  promethazine (PHENERGAN) 12.5 MG tablet Take 1 tablet (12.5 mg total) by mouth every 8 (eight) hours as needed for nausea or vomiting. 03/08/21 04/07/21 Yes Carver, Elon Alas, DO  atorvastatin (LIPITOR) 40 MG tablet Take 40 mg by mouth daily in the afternoon. In the afternoon 01/15/21   Dagoberto Ligas, PA-C  midodrine (PROAMATINE) 10 MG tablet Take 1 tablet (10 mg total) by mouth 3 (three) times daily with meals. Patient not taking: Reported on 03/08/2021 04/28/20   Geradine Girt, DO  Nutritional Supplements (FEEDING SUPPLEMENT, NEPRO CARB STEADY,) LIQD Take 237 mLs by mouth 2 (two) times daily between meals. Patient not taking: Reported on 03/08/2021 02/28/21   Lavina Hamman, MD  oxyCODONE-acetaminophen (PERCOCET) 5-325 MG tablet Take 1 tablet by mouth every 6 (six) hours as needed for severe pain. Patient not taking: Reported on 03/08/2021 01/15/21   Dagoberto Ligas, PA-C  OXYGEN Inhale 2 L into the lungs See admin instructions. Use every night and as needed  during the day    [provider]  PRESCRIPTION MEDICATION Inhale into the lungs at bedtime. Bipap    [provider]  sucralfate (CARAFATE) 1 GM/10ML suspension Take 10 mLs (1 g total) by mouth 2 (two) times daily for 14 days. 02/28/21 03/14/21  Lavina Hamman, MD    Current Facility-Administered Medications  Medication Dose Route Frequency Provider Last Rate Last Admin   0.9 %  sodium chloride infusion  250 mL Intravenous PRN Orma Flaming, MD       0.9 %  sodium chloride infusion  100 mL Intravenous PRN Valentina Gu, NP       0.9 %  sodium chloride infusion  100 mL Intravenous PRN Valentina Gu, NP       albuterol (PROVENTIL) (2.5 MG/3ML) 0.083% nebulizer solution 2.5 mg  2.5 mg Nebulization QID PRN Orma Flaming, MD       allopurinol (ZYLOPRIM) tablet 100 mg  100 mg Oral QHS Orma Flaming, MD       alteplase (CATHFLO ACTIVASE) injection 2 mg  2 mg Intracatheter Once PRN Valentina Gu, NP       amiodarone (PACERONE) tablet 200 mg  200 mg Oral q AM Orma Flaming, MD       atorvastatin (LIPITOR) tablet 40 mg  40 mg Oral Q1500 Orma Flaming, MD       calcitRIOL (ROCALTROL) capsule 1.25 mcg  1.25 mcg Oral Once in dialysis Valentina Gu, NP       calcitRIOL (ROCALTROL) capsule 1.25 mcg  1.25 mcg Oral Q M,W,F-HD Valentina Gu, NP       Chlorhexidine Gluconate Cloth 2 % PADS 6 each  6 each Topical Q0600 Valentina Gu, NP   6 each at 03/14/21 0606   famotidine (PEPCID) tablet 20 mg  20 mg Oral QPM Orma Flaming, MD       feeding supplement (NEPRO CARB STEADY) liquid 237 mL  237 mL Oral BID BM Orma Flaming, MD       ferric citrate (AURYXIA) tablet 210 mg  210 mg Oral TID WC Orma Flaming, MD       fluticasone furoate-vilanterol (BREO ELLIPTA) 100-25 MCG/INH 1 puff  1 puff Inhalation Daily Ardyth Harps B, RPH       gabapentin  (NEURONTIN) capsule 100 mg  100 mg Oral QHS Orma Flaming, MD       heparin injection 1,000 Units  1,000 Units Dialysis PRN Valentina Gu, NP       insulin aspart (novoLOG) injection 0-6 Units  0-6 Units Subcutaneous TID WC Orma Flaming, MD       lidocaine (PF) (XYLOCAINE) 1 % injection 5 mL  5 mL Intradermal PRN Valentina Gu, NP       lidocaine-prilocaine (EMLA) cream 1 application  1 application Topical PRN Valentina Gu, NP       midodrine (PROAMATINE) tablet 10 mg  10 mg Oral TID WC Alekh, Kshitiz, MD   10 mg at 03/14/21 1026   nitroGLYCERIN (NITROSTAT) SL tablet 0.4 mg  0.4 mg Sublingual Q5 Min x 3 PRN Orma Flaming, MD       ondansetron [facility]) tablet 4 mg  4 mg Oral Q6H PRN Orma Flaming, MD       pantoprazole (PROTONIX) 80 mg /NS 100 mL IVPB  80 mg Intravenous Q12H Orma Flaming, MD 300 mL/hr at 03/13/21 2100 80 mg at 03/13/21 2100   pentafluoroprop-tetrafluoroeth (GEBAUERS) aerosol 1 application  1 application Topical PRN  Valentina Gu, NP       promethazine (PHENERGAN) 12.5 mg in sodium chloride 0.9 % 50 mL IVPB  12.5 mg Intravenous Q6H PRN Mansy, Jan A, MD       sodium chloride flush (NS) 0.9 % injection 3 mL  3 mL Intravenous Q12H Orma Flaming, MD       sodium chloride flush (NS) 0.9 % injection 3 mL  3 mL Intravenous PRN Orma Flaming, MD       umeclidinium bromide (INCRUSE ELLIPTA) 62.5 MCG/INH 1 puff  1 puff Inhalation Daily Mignon Pine, RPH        Allergies as of 03/13/2021   (No Known Allergies)    Family History  Problem Relation Age of Onset   Colon cancer Brother        diagnosed age 37. Living.    Ulcers Sister    Diabetes Sister    Heart attack Sister    Kidney failure Sister    Stroke Sister    Ulcers Mother    Diabetes Mother    Heart attack Mother    Stroke Mother    Asthma Mother    Heart disease Mother    Cervical cancer Mother    Heart attack Brother    Heart disease Brother    Asthma Sister    Diabetes  Brother    Stroke Maternal Grandmother    Heart attack Maternal Grandmother    Heart attack Other    Early death Father        MVA in his 28s    Social History   Socioeconomic History   Marital status: Widowed    Spouse name: Peplinski   Number of children: 4   Years of education: Not on file   Highest education level: 10th grade  Occupational History   Occupation: retired    Comment: Ambulance person, [place]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 Lives in home with Hehr - married 13 Y Cook, sew, quilt, crochet   Social Determinants of Health   Financial Resource Strain: Not on file  Food Insecurity: Not on file  Transportation Needs: Not on file  Physical Activity: Not on file  Stress: Not on file  Social Connections: Not on file  Intimate Partner Violence: Not on file    Review of Systems: Pertinent positive and negative review of systems were noted in the above HPI section.  All other review of systems was otherwise negative.   Physical Exam: Vital signs in last 24 hours: Temp:  [98 F (36.7 C)-98.6 F (37 C)] 98.6 F (37 C) (08/10 0825) Pulse Rate:  [76-114] 87 (08/10 1053) Resp:  [13-26] 18 (08/10 1000) BP: (71-135)/(30-90) 92/48 (08/10 1130) SpO2:  [94 %-100 %] 98 % (08/10  0900) Weight:  [74 kg-76.5 kg] 76.5 kg (08/10 0825) Last BM Date: 03/13/21 General:   Alert,  Well-developed, chronically ill-appearing elderly white  female  cooperative, complaining of headache and will not open her eyes.  In NAD somewhat somnolent Head:  Normocephalic and atraumatic. Eyes:  Sclera clear, no icterus.   Conjunctiva pink. Ears:  Normal auditory acuity. Nose:  No deformity, discharge,  or lesions. Mouth:  No deformity or lesions.   Neck:  Supple; no masses or thyromegaly. Lungs:  Clear throughout to auscultation.   No wheezes, crackles, or rhonchi.  Heart:  irRegular rate and rhythm; no murmurs, clicks, rubs,  or gallops. Abdomen:  Soft, large, no focal tenderness, BS active,nonpalp mass or hsm.   Rectal: Documented heme positive Msk:  Symmetrical without gross deformities. . Pulses:  Normal pulses noted. Extremities:  Without clubbing or edema. Neurologic:  Alert ;  grossly normal neurologically.  Complaining of headache Skin:  Intact without significant lesions or rashes.. Psych:  Alert and cooperative.   Intake/Output from previous day: 08/09 0701 - 08/10 0700 In: 150 [IV Piggyback:150] Out: 0  Intake/Output this shift: No intake/output data recorded.  Lab Results: Recent Labs    03/13/21 2022 03/14/21 0044 03/14/21 0418  WBC 16.9* 20.0* 19.3*  HGB 8.1* 7.4* 7.4*  HCT 24.3* 23.4* 23.2*  PLT 357 379 316   BMET Recent Labs    03/13/21 1106 03/13/21 1511 03/13/21 2022 03/14/21 0902  NA 138 137 141 139  K 6.6* 6.0* 5.8* 5.6*  CL 96* 101 98 97*  CO2 28  --  26 29  GLUCOSE 118* 184* 135* 115*  BUN 57* 52* 62* 63*  CREATININE 8.49* 9.30* 8.78* 9.46*  CALCIUM 9.3  --  9.2 8.7*   LFT Recent Labs    03/13/21 1106 03/14/21 0902  PROT 6.0*  --   ALBUMIN 2.0* 1.6*  AST 10*  --   ALT 9  --   ALKPHOS 92  --   BILITOT 0.5  --    PT/INR No results for input(s): LABPROT, INR in the last 72 hours. Hepatitis Panel No results for input(s): HEPBSAG, HCVAB, HEPAIGM, HEPBIGM in the last 72 hours.       IMPRESSION:  #86 75 year old white female with multiple serious comorbidities including  end-stage renal disease on dialysis, admitted yesterday with complaints of persistent nausea, vomiting and dry heaves over the past 4 to 5 days, did not complete her last dialysis session and then missed dialysis over the weekend due to the above symptoms.  She has been having some chronic problems with nausea. Patient then reported bright red blood per rectum yesterday x2.  This has not recurred since admission  She was noted to have a drop in hemoglobin of about 2 g from her baseline.  Patient has previously documented pan diverticular disease on colonoscopy done about 1 year ago.  Rectal bleeding certainly may be secondary to diverticular bleed.  Etiology of nausea and dry heaves not clear.  She is status post cholecystectomy.  She just had very recent EGD about 2 weeks ago here and was found to have a large hiatal hernia and moderate gastritis.  Consider component of gastric volvulus she has the majority of her stomach in the chest.  Rule out paraesophageal component.  Rule out gastroparesis and diabetic patient   #2 atrial fibrillation-on chronic Eliquis 3 coronary artery disease 4.  COPD 5.  Sleep apnea 6.  History of adenomatous polyps-last colonoscopy 1 year ago no  recurrent polyps and random biopsies negative 7.onset diabetes mellitus   PLAN: Full liquid diet as tolerated Start around-the-clock antiemetic/Zofran Have decreased PPI to 40 mg IV daily Serial hemoglobins and transfuse to keep hemoglobin 7 or above Hold Eliquis for now Do not plan repeat colonoscopy at this time, will monitor for evidence of persistent bleeding She may need further imaging with CT of the abdomen and pelvis as she has been having persistent complaints of nausea and vomiting. Also with complaints of headache consider imaging of the head. GI will follow with you Amy Esterwood PA-C 03/14/2021, 11:58 AM

## 2021-03-14 NOTE — Procedures (Signed)
Pt admitted OBV status for N/V and possibly some bloody stool.  BP's are soft on HD today.  Pt is alert and w/o complaints. Will not get much fluid off today.  Will follow along.  If full inpatient admit, we will do formal consult.   I was present at this dialysis session, have reviewed the session itself and made  appropriate changes Kelly Splinter MD [place] pager [phone]   03/14/2021, 11:57 AM

## 2021-03-14 NOTE — Progress Notes (Signed)
Pt off unit to hemodialysis. 

## 2021-03-14 NOTE — Progress Notes (Signed)
Son [name] will like an update. Pt said its ok to speak with [phone].

## 2021-03-14 NOTE — Progress Notes (Signed)
Patient ID: [name], female   DOB: 1946-07-09, 75 y.o.   MRN: [id number]  PROGRESS NOTE    [name]  GXQ:[id number] DOB: 11/07/1945 DOA: 03/13/2021 PCP: [name], MD   Brief Narrative:  75 y.o. female with medical history significant of ESRD on HD, HTN, HLD, PAF on Eliquis, GERD with esophagitis, OSA on BiPAP, colon polyps, and prior history of COVID-19 pneumonia where hospital course was complicated by upper GI bleed, recent hospitalization from 02/25/2021-02/28/2021 for hematemesis/nausea and vomiting status post EGD on 02/26/2021 which had shown gastritis with superficial erosions and large hiatal hernia at risk for volvulus as per GI presented with nausea, vomiting and bloody bowel movements.  On presentation, hemoglobin was 9.4, WBCs of 15.6, potassium 6.6 with grossly positive blood in stool on exam as per ED provider.  Nephrology and GI were consulted.  She was given Lokelma, NovoLog and dextrose 50% for hyperkalemia.  No EKG changes of hyperkalemia.  Assessment & Plan:   GI bleeding -Patient had gross blood on rectal exam on presentation.  Had recent admission for GI bleeding and EGD on 02/26/2021 had shown gastritis with superficial erosions and large hiatal hernia at risk for volvulus as per GI -GI evaluation pending. -Hemoglobin 9.4 on presentation.  Hemoglobin 7.4 this morning.  No bowel movement since admission.  No hematemesis.  Eliquis on hold. -Transfuse packed red cells as needed for hemoglobin less than 7 or active bleeding. -Monitor H&H.  Continue IV Protonix  Intractable nausea and vomiting -Improving.  Continue antiemetics.  Hyperkalemia -Presented with potassium of 6.6.  Treated with Lokelma, NovoLog and dextrose 50% in the ED; no EKG changes on presentation. -Potassium 5.6 this morning.  Currently undergoing dialysis.  Follow nephrology recommendations.  Repeat a.m. labs.  End-stage renal disease on hemodialysis -Nephrology following.  Dialysis  as per nephrology schedule  Hiatal hernia with GERD and esophagitis -As per recent EGD.  At risk for gastric volvulus.  Abdominal x-ray showed no evidence of volvulus.  Continue IV Protonix.  Follow GI recommendations  Leukocytosis -Possibly reactive.  Monitor  Paroxysmal A. fib -Rate controlled.  Continue amiodarone.  Eliquis on hold.  Diabetes mellitus type 2 with peripheral neuropathy  -appears to be diet controlled.  A1c 5.1 in March 2022.  Continue CBGs with SSI.  Hypotension -Blood pressure on the lower side.  Used to be on midodrine; apparently she has not used it recently.  Might have to restart midodrine if blood pressure remains low.  OSA-continue BiPAP at night  Chronic respiratory failure/chronic obstructive pulmonary disease -Uses 2 L oxygen via nasal cannula as needed.  Currently respiratory status is stable.  Continue home regimen.  Hyperlipidemia -Continue Lipitor  Resting tremor -Outpatient follow-up with neurology  Obesity -Outpatient follow-up  Generalized deconditioning -PT eval  DVT prophylaxis: SCDs Code Status: Full Family Communication: None at bedside  disposition Plan: Status is: Observation  The patient will require care spanning > 2 midnights and should be moved to inpatient because: Inpatient level of care appropriate due to severity of illness  Dispo: The patient is from: Home              Anticipated d/c is to: Home              Patient currently is not medically stable to d/c.   Difficult to place patient No  Consultants: Nephrology/GI  Procedures: None  Antimicrobials: None   Subjective: Patient seen and examined at bedside undergoing hemodialysis.  Does not feel  well.  Has not had any bowel movement since admission.  Denies any current nausea or vomiting.  No overnight fever reported.  Objective: Vitals:   03/14/21 0830 03/14/21 0832 03/14/21 0900 03/14/21 0930  BP:  (!) 100/43 (!) 98/47 (!) 73/43  Pulse:  76 79   Resp:  14  (!) 26   Temp:      TempSrc:      SpO2:   98%   Weight:      Height:        Intake/Output Summary (Last 24 hours) at 03/14/2021 0957 Last data filed at 03/14/2021 0600 Gross per 24 hour  Intake 150 ml  Output 0 ml  Net 150 ml   Filed Weights   03/13/21 1352 03/14/21 0825  Weight: 74 kg 76.5 kg    Examination:  General exam: Appears calm and comfortable.  Looks chronically ill and deconditioned. Respiratory system: Bilateral decreased breath sounds at bases with scattered crackles Cardiovascular system: S1 & S2 heard, Rate controlled Gastrointestinal system: Abdomen is obese, distended slightly, soft and nontender. Normal bowel sounds heard. Extremities: No cyanosis, clubbing; bilateral lower extremity edema present Central nervous system: Awake, slow to respond.  Poor historian.  No focal neurological deficits. Moving extremities Skin: No rashes, lesions or ulcers Psychiatry: Mostly flat affect   Data Reviewed: I have personally reviewed following labs and imaging studies  CBC: Recent Labs  Lab 03/13/21 1106 03/13/21 1511 03/13/21 1637 03/13/21 2022 03/14/21 0044 03/14/21 0418  WBC 15.6*  --  17.1* 16.9* 20.0* 19.3*  NEUTROABS 12.4*  --   --   --   --   --   HGB 9.4* 8.5* 8.6* 8.1* 7.4* 7.4*  HCT 29.0* 25.0* 26.6* 24.3* 23.4* 23.2*  MCV 91.8  --  90.2 89.3 91.4 90.6  PLT 390  --  400 357 379 270   Basic Metabolic Panel: Recent Labs  Lab 03/13/21 1106 03/13/21 1511 03/13/21 2022 03/14/21 0902  NA 138 137 141 139  K 6.6* 6.0* 5.8* 5.6*  CL 96* 101 98 97*  CO2 28  --  26 29  GLUCOSE 118* 184* 135* 115*  BUN 57* 52* 62* 63*  CREATININE 8.49* 9.30* 8.78* 9.46*  CALCIUM 9.3  --  9.2 8.7*  PHOS  --   --   --  4.2   GFR: Estimated Creatinine Clearance: 4.2 mL/min (A) (by C-G formula based on SCr of 9.46 mg/dL (H)). Liver Function Tests: Recent Labs  Lab 03/13/21 1106 03/14/21 0902  AST 10*  --   ALT 9  --   ALKPHOS 92  --   BILITOT 0.5  --    PROT 6.0*  --   ALBUMIN 2.0* 1.6*   Recent Labs  Lab 03/13/21 1106  LIPASE 26   No results for input(s): AMMONIA in the last 168 hours. Coagulation Profile: No results for input(s): INR, PROTIME in the last 168 hours. Cardiac Enzymes: No results for input(s): CKTOTAL, CKMB, CKMBINDEX, TROPONINI in the last 168 hours. BNP (last 3 results) No results for input(s): PROBNP in the last 8760 hours. HbA1C: No results for input(s): HGBA1C in the last 72 hours. CBG: Recent Labs  Lab 03/14/21 0639  GLUCAP 99   Lipid Profile: Recent Labs    03/14/21 0044  CHOL 115  HDL 40*  LDLCALC 60  TRIG 75  CHOLHDL 2.9   Thyroid Function Tests: No results for input(s): TSH, T4TOTAL, FREET4, T3FREE, THYROIDAB in the last 72 hours. Anemia Panel: No results for  input(s): VITAMINB12, FOLATE, FERRITIN, TIBC, IRON, RETICCTPCT in the last 72 hours. Sepsis Labs: No results for input(s): PROCALCITON, LATICACIDVEN in the last 168 hours.  Recent Results (from the past 240 hour(s))  Resp Panel by RT-PCR (Flu A&B, Covid) Nasopharyngeal Swab     Status: None   Collection Time: 03/13/21  2:10 PM   Specimen: Nasopharyngeal Swab; Nasopharyngeal(NP) swabs in vial transport medium  Result Value Ref Range Status   SARS Coronavirus 2 by RT PCR NEGATIVE NEGATIVE Final    Comment: (NOTE) SARS-CoV-2 target nucleic acids are NOT DETECTED.  The SARS-CoV-2 RNA is generally detectable in upper respiratory specimens during the acute phase of infection. The lowest concentration of SARS-CoV-2 viral copies this assay can detect is 138 copies/mL. A negative result does not preclude SARS-Cov-2 infection and should not be used as the sole basis for treatment or other patient management decisions. A negative result may occur with  improper specimen collection/handling, submission of specimen other than nasopharyngeal swab, presence of viral mutation(s) within the areas targeted by this assay, and inadequate number of  viral copies(<138 copies/mL). A negative result must be combined with clinical observations, patient history, and epidemiological information. The expected result is Negative.  Fact Sheet for Patients:  EntrepreneurPulse.com.au  Fact Sheet for Healthcare Providers:  IncredibleEmployment.be  This test is no t yet approved or cleared by the [place] FDA and  has been authorized for detection and/or diagnosis of SARS-CoV-2 by FDA under an Emergency Use Authorization (EUA). This EUA will remain  in effect (meaning this test can be used) for the duration of the COVID-19 declaration under Section 564(b)(1) of the Act, 21 U.S.C.section 360bbb-3(b)(1), unless the authorization is terminated  or revoked sooner.       Influenza A by PCR NEGATIVE NEGATIVE Final   Influenza B by PCR NEGATIVE NEGATIVE Final    Comment: (NOTE) The Xpert Xpress SARS-CoV-2/FLU/RSV plus assay is intended as an aid in the diagnosis of influenza from Nasopharyngeal swab specimens and should not be used as a sole basis for treatment. Nasal washings and aspirates are unacceptable for Xpert Xpress SARS-CoV-2/FLU/RSV testing.  Fact Sheet for Patients: EntrepreneurPulse.com.au  Fact Sheet for Healthcare Providers: IncredibleEmployment.be  This test is not yet approved or cleared by the [place] FDA and has been authorized for detection and/or diagnosis of SARS-CoV-2 by FDA under an Emergency Use Authorization (EUA). This EUA will remain in effect (meaning this test can be used) for the duration of the COVID-19 declaration under Section 564(b)(1) of the Act, 21 U.S.C. section 360bbb-3(b)(1), unless the authorization is terminated or revoked.  Performed at [facility], [place] [address] [place], [place] [address]          Radiology Studies: DG Abd 1 View  Result Date: 03/13/2021 CLINICAL DATA:  Intractable nausea and  vomiting. EXAM: ABDOMEN - 1 VIEW COMPARISON:  07/17/2019 FINDINGS: There is diffuse gaseous distension of small bowel with air in stool seen scattered along the course of the colon. No overt small bowel obstruction by x-ray. Lower lumbar fusion hardware evident. Atherosclerotic calcification noted in the dominant arterial anatomy of the lower abdomen and pelvis. Bones are diffusely demineralized. IMPRESSION: Diffuse gaseous distention of small bowel with air in stool scattered along the length of the colon. Bowel gas pattern is not overtly obstructive although component of ileus or gastroenteritis a consideration. CT imaging could be used to further evaluate as clinically warranted. Electronically Signed   By: Misty Stanley M.D.   On:  03/13/2021 20:14        Scheduled Meds:  allopurinol  100 mg Oral QHS   amiodarone  200 mg Oral q AM   atorvastatin  40 mg Oral Q1500   calcitRIOL  1.25 mcg Oral Once in dialysis   calcitRIOL  1.25 mcg Oral Q M,W,F-HD   Chlorhexidine Gluconate Cloth  6 each Topical Q0600   famotidine  20 mg Oral QPM   feeding supplement (NEPRO CARB STEADY)  237 mL Oral BID BM   ferric citrate  210 mg Oral TID WC   fluticasone furoate-vilanterol  1 puff Inhalation Daily   gabapentin  100 mg Oral QHS   insulin aspart  0-6 Units Subcutaneous TID WC   sodium chloride flush  3 mL Intravenous Q12H   umeclidinium bromide  1 puff Inhalation Daily   Continuous Infusions:  sodium chloride     sodium chloride     sodium chloride     pantoprazole (PROTONIX) IV 80 mg (03/13/21 2100)   promethazine (PHENERGAN) injection (IM or IVPB)            Aline August, MD [facility] 03/14/2021, 9:57 AM

## 2021-03-14 NOTE — Progress Notes (Signed)
Patient has home machine at bedside.  RT assistance not needed at this time.

## 2021-03-15 ENCOUNTER — Inpatient Hospital Stay (HOSPITAL_COMMUNITY): Payer: Medicare Other

## 2021-03-15 DIAGNOSIS — R0609 Other forms of dyspnea: Secondary | ICD-10-CM | POA: Diagnosis not present

## 2021-03-15 DIAGNOSIS — K449 Diaphragmatic hernia without obstruction or gangrene: Secondary | ICD-10-CM | POA: Diagnosis not present

## 2021-03-15 DIAGNOSIS — N186 End stage renal disease: Secondary | ICD-10-CM | POA: Diagnosis not present

## 2021-03-15 DIAGNOSIS — E1142 Type 2 diabetes mellitus with diabetic polyneuropathy: Secondary | ICD-10-CM | POA: Diagnosis not present

## 2021-03-15 DIAGNOSIS — K922 Gastrointestinal hemorrhage, unspecified: Secondary | ICD-10-CM | POA: Diagnosis not present

## 2021-03-15 DIAGNOSIS — D631 Anemia in chronic kidney disease: Secondary | ICD-10-CM | POA: Diagnosis not present

## 2021-03-15 DIAGNOSIS — Z992 Dependence on renal dialysis: Secondary | ICD-10-CM | POA: Diagnosis not present

## 2021-03-15 DIAGNOSIS — R112 Nausea with vomiting, unspecified: Secondary | ICD-10-CM | POA: Diagnosis not present

## 2021-03-15 LAB — BASIC METABOLIC PANEL
Anion gap: 9 (ref 5–15)
BUN: 20 mg/dL (ref 8–23)
CO2: 29 mmol/L (ref 22–32)
Calcium: 7.7 mg/dL — ABNORMAL LOW (ref 8.9–10.3)
Chloride: 98 mmol/L (ref 98–111)
Creatinine, Ser: 4.52 mg/dL — ABNORMAL HIGH (ref 0.44–1.00)
GFR, Estimated: 10 mL/min — ABNORMAL LOW (ref >60)
Glucose, Bld: 72 mg/dL (ref 70–99)
Potassium: 3.8 mmol/L (ref 3.5–5.1)
Sodium: 136 mmol/L (ref 135–145)

## 2021-03-15 LAB — CBC WITH DIFFERENTIAL/PLATELET
Abs Immature Granulocytes: 0.3 10*3/uL — ABNORMAL HIGH (ref 0.00–0.07)
Basophils Absolute: 0 10*3/uL (ref 0.0–0.1)
Basophils Relative: 0 %
Eosinophils Absolute: 0.2 10*3/uL (ref 0.0–0.5)
Eosinophils Relative: 1 %
HCT: 21.8 % — ABNORMAL LOW (ref 36.0–46.0)
Hemoglobin: 7.1 g/dL — ABNORMAL LOW (ref 12.0–15.0)
Immature Granulocytes: 2 %
Lymphocytes Relative: 7 %
Lymphs Abs: 1 10*3/uL (ref 0.7–4.0)
MCH: 29.6 pg (ref 26.0–34.0)
MCHC: 32.6 g/dL (ref 30.0–36.0)
MCV: 90.8 fL (ref 80.0–100.0)
Monocytes Absolute: 0.9 10*3/uL (ref 0.1–1.0)
Monocytes Relative: 6 %
Neutro Abs: 12.1 10*3/uL — ABNORMAL HIGH (ref 1.7–7.7)
Neutrophils Relative %: 84 %
Platelets: 301 10*3/uL (ref 150–400)
RBC: 2.4 MIL/uL — ABNORMAL LOW (ref 3.87–5.11)
RDW: 15.8 % — ABNORMAL HIGH (ref 11.5–15.5)
WBC: 14.5 10*3/uL — ABNORMAL HIGH (ref 4.0–10.5)
nRBC: 0 % (ref 0.0–0.2)

## 2021-03-15 LAB — ECHOCARDIOGRAM COMPLETE
Area-P 1/2: 3.36 cm2
Height: 56 in
Weight: 2705.49 oz

## 2021-03-15 LAB — HEMOGLOBIN AND HEMATOCRIT, BLOOD
HCT: 26.6 % — ABNORMAL LOW (ref 36.0–46.0)
HCT: 27.5 % — ABNORMAL LOW (ref 36.0–46.0)
Hemoglobin: 8.5 g/dL — ABNORMAL LOW (ref 12.0–15.0)
Hemoglobin: 9.1 g/dL — ABNORMAL LOW (ref 12.0–15.0)

## 2021-03-15 LAB — GLUCOSE, CAPILLARY
Glucose-Capillary: 62 mg/dL — ABNORMAL LOW (ref 70–99)
Glucose-Capillary: 63 mg/dL — ABNORMAL LOW (ref 70–99)
Glucose-Capillary: 64 mg/dL — ABNORMAL LOW (ref 70–99)
Glucose-Capillary: 80 mg/dL (ref 70–99)
Glucose-Capillary: 69 mg/dL — ABNORMAL LOW (ref 70–99)
Glucose-Capillary: 69 mg/dL — ABNORMAL LOW (ref 70–99)
Glucose-Capillary: 83 mg/dL (ref 70–99)
Glucose-Capillary: 89 mg/dL (ref 70–99)
Glucose-Capillary: 82 mg/dL (ref 70–99)

## 2021-03-15 LAB — PREPARE RBC (CROSSMATCH)

## 2021-03-15 MED ORDER — SODIUM CHLORIDE 0.9% IV SOLUTION: Freq: Once | INTRAVENOUS | Status: DC

## 2021-03-15 MED ORDER — GLUCOSE 40 % PO GEL
1.0000 | ORAL | Status: AC
  Administered 2021-03-15: 31 g via ORAL

## 2021-03-15 MED ORDER — GLUCOSE 40 % PO GEL
ORAL | Status: AC
  Filled 2021-03-15: qty 1

## 2021-03-15 MED ORDER — DARBEPOETIN ALFA 150 MCG/0.3ML IJ SOSY
150.0000 ug | PREFILLED_SYRINGE | INTRAMUSCULAR | Status: DC
  Administered 2021-03-15: 150 ug via SUBCUTANEOUS
  Filled 2021-03-15: qty 0.3

## 2021-03-15 MED ORDER — GLUCOSE 40 % PO GEL
ORAL | Status: AC
  Administered 2021-03-15: 31 g via ORAL
  Filled 2021-03-15: qty 1

## 2021-03-15 MED ORDER — PERFLUTREN LIPID MICROSPHERE
1.0000 mL | INTRAVENOUS | Status: AC | PRN
  Administered 2021-03-15: 3 mL via INTRAVENOUS
  Filled 2021-03-15: qty 10

## 2021-03-15 NOTE — Evaluation (Signed)
Physical Therapy Evaluation Patient Details Name: [name] MRN: [id number] DOB: [birth year] Today's Date: 03/15/2021   History of Present Illness  75 yo female presents to [facility] on 8/9 with heme + stool, N/V, RUQ abdominal pain. EGD 7/25 shows large hiatial hernia, moderate gastritis. PMHx: ESRD on HD TTS, cervical cancer, CAD, obstructive sleep apnea (on BIPAP QHS), HLD, GERD, DMT2 with neuropathy, obesity, COPD, AFib, HTN, covid with PNA and upper GIB.  Clinical Impression   Pt presents with generalized weakness, impaired balance, and decreased activity tolerance vs baseline. Pt to benefit from acute PT to address deficits. Pt ambulated short room distance with RW, overall requiring min assist for mobility tasks at this time. Pt not at mobility baseline, recommending HHPT and increased family support at d/c, pt stating her family is home close to 24/7. PT to progress mobility as tolerated, and will continue to follow acutely.      Follow Up Recommendations Home health PT;Supervision/Assistance - 24 hour    Equipment Recommendations  None recommended by PT    Recommendations for Other Services       Precautions / Restrictions Precautions Precautions: Fall Restrictions Weight Bearing Restrictions: No      Mobility  Bed Mobility Overal bed mobility: Needs Assistance Bed Mobility: Supine to Sit;Sit to Supine     Supine to sit: Min assist;HOB elevated Sit to supine: Min assist;HOB elevated   General bed mobility comments: min assist for trunk elevation from supine>sit, LE lifting into bed and boost up in return to supine.    Transfers Overall transfer level: Needs assistance Equipment used: Rolling walker (2 wheeled) Transfers: Sit to/from Stand Sit to Stand: Min assist         General transfer comment: min assist for power up, steadying upon standing. VC for hand placement when rising/sitting.  Ambulation/Gait Ambulation/Gait assistance: Min assist Gait  Distance (Feet): 12 Feet Assistive device: Rolling walker (2 wheeled) Gait Pattern/deviations: Step-through pattern;Trunk flexed;Decreased stride length Gait velocity: decr   General Gait Details: min assist to steady, physically guide RW. Verbal cuing for upright posture, navigating RW, correct hand placement.  Stairs            Wheelchair Mobility    Modified Rankin (Stroke Patients Only)       Balance Overall balance assessment: Needs assistance Sitting-balance support: No upper extremity supported;Feet supported Sitting balance-Leahy Scale: Fair     Standing balance support: Bilateral upper extremity supported;During functional activity Standing balance-Leahy Scale: Poor Standing balance comment: reliant on external support                             Pertinent Vitals/Pain Pain Assessment: No/denies pain Pain Intervention(s): Monitored during session    Home Living Family/patient expects to be discharged to:: Private residence Living Arrangements: Children Available Help at Discharge: Family;Available 24 hours/day Type of Home: House Home Access: Level entry     Home Layout: One level Home Equipment: Walker - 2 wheels;Walker - 4 wheels Additional Comments: uses 2L O2 when needed    Prior Function Level of Independence: Needs assistance   Gait / Transfers Assistance Needed: uses RW for gait at home     Comments: reports a few falls, son takes her to dialysis, however she still drives     Hand Dominance   Dominant Hand: Right    Extremity/Trunk Assessment   Upper Extremity Assessment Upper Extremity Assessment: Defer to OT evaluation    Lower Extremity  Assessment Lower Extremity Assessment: Generalized weakness    Cervical / Trunk Assessment Cervical / Trunk Assessment: Other exceptions Cervical / Trunk Exceptions: forward flexed truncal posture in standing  Communication   Communication: No difficulties  Cognition  Arousal/Alertness: Awake/alert Behavior During Therapy: WFL for tasks assessed/performed;Flat affect Overall Cognitive Status: Within Functional Limits for tasks assessed                                 General Comments: flat affect, but suspect pt baseline.      General Comments      Exercises     Assessment/Plan    PT Assessment Patient needs continued PT services  PT Problem List Decreased strength;Decreased mobility;Decreased activity tolerance;Decreased balance;Decreased knowledge of use of DME;Pain;Decreased safety awareness       PT Treatment Interventions DME instruction;Therapeutic activities;Gait training;Therapeutic exercise;Patient/family education;Balance training;Functional mobility training;Neuromuscular re-education    PT Goals (Current goals can be found in the Care Plan section)  Acute Rehab PT Goals Patient Stated Goal: d/c home when ready PT Goal Formulation: With patient Time For Goal Achievement: 03/29/21 Potential to Achieve Goals: Good    Frequency Min 3X/week   Barriers to discharge        Co-evaluation               AM-PAC PT "6 Clicks" Mobility  Outcome Measure Help needed turning from your back to your side while in a flat bed without using bedrails?: A Little Help needed moving from lying on your back to sitting on the side of a flat bed without using bedrails?: A Little Help needed moving to and from a bed to a chair (including a wheelchair)?: A Little Help needed standing up from a chair using your arms (e.g., wheelchair or bedside chair)?: A Little Help needed to walk in hospital room?: A Little Help needed climbing 3-5 steps with a railing? : A Lot 6 Click Score: 17    End of Session Equipment Utilized During Treatment: Oxygen (O2 applied at end of session) Activity Tolerance: Patient tolerated treatment well;Patient limited by fatigue Patient left: in bed;with call bell/phone within reach;with bed alarm  set Nurse Communication: Mobility status PT Visit Diagnosis: Other abnormalities of gait and mobility (R26.89);Muscle weakness (generalized) (M62.81)    Time: 2707-8675 PT Time Calculation (min) (ACUTE ONLY): 18 min   Charges:   PT Evaluation $PT Eval Low Complexity: 1 Low          Joy Reiger S, PT DPT Acute Rehabilitation Services Pager [phone]  Office [phone]   Louis Matte 03/15/2021, 4:38 PM

## 2021-03-15 NOTE — Plan of Care (Signed)
Pt alert and oriented x 4. Med complaint. Zofran effective for nausea. Pt continues to have productive cough white frothy sputum. Pt encouraged to use incentive spirometer. Pt not complaint used and achieved 567ml. Pt noncompliant with cpap. Hgb this am remains 7.4. Poor intake noted for this shift. Pt did have 1 small bm noted to be brown, pink and scant of red.  BP running low SBP 90. Pt asymptomatic and post dialysis when taken. Will continue to monitor.  Problem: Clinical Measurements: Goal: Diagnostic test results will improve Outcome: Not Progressing   Problem: Nutrition: Goal: Adequate nutrition will be maintained Outcome: Not Progressing   Problem: Education: Goal: Knowledge of General Education information will improve Description: Including pain rating scale, medication(s)/side effects and non-pharmacologic comfort measures Outcome: Progressing   Problem: Health Behavior/Discharge Planning: Goal: Ability to manage health-related needs will improve Outcome: Progressing   Problem: Clinical Measurements: Goal: Ability to maintain clinical measurements within normal limits will improve Outcome: Progressing Goal: Will remain free from infection Outcome: Progressing Goal: Respiratory complications will improve Outcome: Progressing Goal: Cardiovascular complication will be avoided Outcome: Progressing   Problem: Activity: Goal: Risk for activity intolerance will decrease Outcome: Progressing   Problem: Coping: Goal: Level of anxiety will decrease Outcome: Progressing   Problem: Elimination: Goal: Will not experience complications related to bowel motility Outcome: Progressing Goal: Will not experience complications related to urinary retention Outcome: Progressing   Problem: Pain Managment: Goal: General experience of comfort will improve Outcome: Progressing   Problem: Safety: Goal: Ability to remain free from injury will improve Outcome: Progressing   Problem:  Skin Integrity: Goal: Risk for impaired skin integrity will decrease Outcome: Progressing

## 2021-03-15 NOTE — Progress Notes (Addendum)
Patient ID: [name], female   DOB: 1946/06/24, 75 y.o.   MRN: [id number]   Attending physician's note   I have taken an interval history, reviewed the chart and examined the patient. I agree with the Advanced Practitioner's note, impression, and recommendations as outlined.   No acute events overnight.  States her nausea is much better today.  Did tolerate liquid breakfast but upset because "I do not eat grits".  No emesis.  CT head yesterday without acute intracranial pathology.  WBC downtrending at 14.5.  H/H 7.1/21.8, with 1 unit PRBCs ordered for today.  1) Nausea 2) Large hiatal hernia - As she was more awake today, we are able to discuss the multifactorial nature of her nausea, to include her large hiatal hernia, potential for diabetic gastroparesis, ESRD, etc. - Not sure that the patient would be a good operative candidate for elective hiatal hernia repair, but can discuss with on-call surgery about evaluating.  If pursuing surgery, would certainly need to rule out gastroparesis with outpatient GES - Continue scheduled Zofran for now - Ok to advance diet as tolerated - If continued symptoms, can perform CT abdomen/pelvis  3) Anemia 4) History of diverticulosis - Holding Eliquis - Transfusing 1 unit PRBCs today - No overt bleeding - No plan for endoscopic evaluation at this time  5) ESRD - Scheduled for HD tomorrow    Gerrit Heck, DO, FACG [phone] office           Progress Note   Subjective  Day # 3 CC: Nausea vomiting, hematochezia, abdominal pain  Eliquis on hold IV Protonix Scheduled Zofran  Hemoglobin 8.4> 7.4> 7.1 WBC 14.5 Potassium 3.8/BUN 20/creatinine 4.52  KUB 03/13/2021-gaseous distention of the small bowel with air and stool along the length of the colon consider mild ileus or gastroenteritis  CT of the head-no acute abnormality or change from prior CT mild atrophy and mild chronic microvascular ischemic changes  Patient more  alert today still some headache but improved, she says the nausea is improved and she was able to take some clear liquids today.  Complains of mild crampy discomfort in the lower abdomen, no bowel movement today, did have small bowel movement last night question was some blood     Objective   Vital signs in last 24 hours: Temp:  [98 F (36.7 C)-98.6 F (37 C)] 98.5 F (36.9 C) (08/11 1038) Pulse Rate:  [73-90] 73 (08/11 1038) Resp:  [16-20] 18 (08/11 1038) BP: (87-157)/(31-140) 102/40 (08/11 1038) SpO2:  [90 %-95 %] 92 % (08/11 1038) Weight:  [76.7 kg] 76.7 kg (08/11 0500) Last BM Date: 03/14/21 General:   Elderly white female in NAD, chronically ill-appearing Heart:  Regular rate and rhythm; no murmurs Lungs: Respirations even and unlabored, lungs CTA bilaterally Abdomen:  Soft, obese bowel sounds are present there is some mild tenderness proximal to mid abdomen, and nondistended. Normal bowel sounds. Extremities:  Without edema. Neurologic:  Alert and oriented,  grossly normal neurologically. Psych:  Cooperative. Normal mood and affect.  Intake/Output from previous day: 08/10 0701 - 08/11 0700 In: 180 [P.O.:180] Out: -500  Intake/Output this shift: No intake/output data recorded.  Lab Results: Recent Labs    03/14/21 0044 03/14/21 0418 03/14/21 1247 03/14/21 2016 03/15/21 0445  WBC 20.0* 19.3*  --   --  14.5*  HGB 7.4* 7.4* 7.4* 7.4* 7.1*  HCT 23.4* 23.2* 22.4* 22.9* 21.8*  PLT 379 316  --   --  301   BMET Recent  Labs    03/13/21 2022 03/14/21 0902 03/15/21 0445  NA 141 139 136  K 5.8* 5.6* 3.8  CL 98 97* 98  CO2 26 29 29   GLUCOSE 135* 115* 72  BUN 62* 63* 20  CREATININE 8.78* 9.46* 4.52*  CALCIUM 9.2 8.7* 7.7*   LFT Recent Labs    03/13/21 1106 03/14/21 0902  PROT 6.0*  --   ALBUMIN 2.0* 1.6*  AST 10*  --   ALT 9  --   ALKPHOS 92  --   BILITOT 0.5  --    PT/INR No results for input(s): LABPROT, INR in the last 72  hours.  Studies/Results: DG Abd 1 View  Result Date: 03/13/2021 CLINICAL DATA:  Intractable nausea and vomiting. EXAM: ABDOMEN - 1 VIEW COMPARISON:  07/17/2019 FINDINGS: There is diffuse gaseous distension of small bowel with air in stool seen scattered along the course of the colon. No overt small bowel obstruction by x-ray. Lower lumbar fusion hardware evident. Atherosclerotic calcification noted in the dominant arterial anatomy of the lower abdomen and pelvis. Bones are diffusely demineralized. IMPRESSION: Diffuse gaseous distention of small bowel with air in stool scattered along the length of the colon. Bowel gas pattern is not overtly obstructive although component of ileus or gastroenteritis a consideration. CT imaging could be used to further evaluate as clinically warranted. Electronically Signed   By: Misty Stanley M.D.   On: 03/13/2021 20:14   CT HEAD WO CONTRAST (5MM)  Result Date: 03/14/2021 CLINICAL DATA:  Nausea and vomiting EXAM: CT HEAD WITHOUT CONTRAST TECHNIQUE: Contiguous axial images were obtained from the base of the skull through the vertex without intravenous contrast. COMPARISON:  CT head 04/24/2020 FINDINGS: Brain: Mild cerebral atrophy. Mild white matter hypodensity bilaterally which appears chronic and unchanged. Negative for acute infarct, hemorrhage, hydrocephalus Vascular: Negative for hyperdense vessel Skull: Negative Sinuses/Orbits: Paranasal sinuses clear.  No orbital mass Other: None IMPRESSION: No acute abnormality no change from prior CT. Mild atrophy and mild chronic microvascular ischemic change in the white matter. Electronically Signed   By: Franchot Gallo M.D.   On: 03/14/2021 18:03       Assessment / Plan:    #2 75 year old white female with end-stage renal disease on dialysis and insulin-dependent diabetes with recent persistent/recurrent nausea and vomiting Multiple potential etiologies i.e. gastroparesis, large hiatal hernia/intrathoracic stomach Symptoms  worsened with missing dialysis Also on amiodarone-can be associated with nausea and vomiting  Recent EGD July 2022 with moderate gastritis, large hiatal hernia  Improved today on IV PPI and scheduled antiemetic Continue above regimen advance diet as tolerated CT of head negative for acute process  If she does not continue to improve will plan for CT of the abdomen and pelvis  #2 low-grade GI bleeding has known pandiverticulosis had colonoscopy about 1 year ago Continue off Eliquis Being transfused 1 unit today for mild drift in hemoglobin Do not plan colonoscopy at this time  #3 acute on chronic anemia-transfusing x1 today #4 end-stage renal disease-dialysis tomorrow #5 atrial fibrillation-on chronic Eliquis #6 coronary artery disease #7.  COPD     Principal Problem:   GI bleed Active Problems:   DM type 2 with diabetic peripheral neuropathy (HCC)   Benign essential HTN   Hematochezia   OSA (obstructive sleep apnea)   Hiatal hernia with GERD and esophagitis   Intractable nausea and vomiting   Chronic obstructive pulmonary disease (HCC)   Chronic diastolic CHF (congestive heart failure) (HCC)   Hyperkalemia   ESRD (  end stage renal disease) on dialysis ([place])   PAF (paroxysmal atrial fibrillation) (HCC)   Anemia in chronic kidney disease   Non-intractable vomiting     LOS: 1 day   Amy Esterwood  PA-C8/06/2021, 10:57 AM

## 2021-03-15 NOTE — Consult Note (Addendum)
Renal Service Consult Note [facility]  [name] 03/15/2021 Sol Blazing, MD Requesting Physician: Dr. Starla Link  Reason for Consult: ESRD pt w/ nausea, vomiting and anemia HPI: The patient is a 75 y.o. year-old w/ hx of CAD, COPD / OSA, ESRD on HD, DM2, atrial fib, gout who presented w/ nausea/ vomiting and bloody BM's. In ED Hb was 9.4. K+ was 6.6. Renal was consulted and did HD yesterday. Pt was fully admitted and we are asked to formally consult for ESRD.    Pt seen in room. Reports on HD x 6yrs, for sometime she has been having problems towards the end / last hour of dialysis w/ low BP's, malaise and fatigue.  No syncope. No other HD issues.   She had 2nd stage R arm BB AVF surgery on January 15, 2021. We are still using the [facility].   ROS - denies CP, no joint pain, no HA, no blurry vision, no rash, no diarrhea, no nausea/ vomiting, no dysuria, no difficulty voiding   Past Medical History  Past Medical History:  Diagnosis Date   Blood transfusion without reported diagnosis    CAD (coronary artery disease)    Nonobstructive at cardiac catheterization 2000   Cataract    Cervical cancer ([place]) 1978   CHF (congestive heart failure) (HCC)    Chronic back pain    Chronic kidney disease    Class 2 obesity with body mass index (BMI) of 35 to 39.9 without comorbidity    Complication of anesthesia    hard to awaken with one back surgery - years ago, no problem since   COPD (chronic obstructive pulmonary disease) ([place])    Degenerative disc disease    DM (diabetes mellitus), type 2 with renal complications ([place])    Dyslipidemia    Dyspnea    "due to COPD"   Dysrhythmia    a-fib   ESRD (end stage renal disease) on dialysis ([place])    TTHS- [address]   GERD (gastroesophageal reflux disease)    Gout    Hiatal hernia 07/27/2013   History of diverticulitis of colon    History of hiatal hernia    HTN (hypertension)    09/09/20 not currently on medication, is on  medication for hypotension   Hypotension    Iron deficiency anemia    Irritable bowel syndrome    Lumbar radiculopathy    Mixed hyperlipidemia    Moderate major depression, 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 States this was cervical cancer   Oxygen deficiency    room air   PAF (paroxysmal atrial fibrillation) (HCC)    Pneumonia    Sleep apnea    bipap - oxygen 2 l to bipap.   Type 2 diabetes mellitus ([place])    Vitamin B deficiency 12/25/2009   Vitamin B12 deficiency    Past Surgical History  Past Surgical History:  Procedure Laterality Date   ABDOMINAL HYSTERECTOMY     APPLICATION OF WOUND VAC Right 01/15/2021   Procedure: APPLICATION OF WOUND VAC;  Surgeon: Cherre Robins, MD;  Location: [place];  Service: Vascular;  Laterality: Right;   AV FISTULA PLACEMENT Left 08/05/2019   Procedure: ARTERIOVENOUS (AV) FISTULA CREATION LEFT ARM;  Surgeon: Waynetta Sandy, MD;  Location: [place];  Service: Vascular;  Laterality: Left;   AV FISTULA PLACEMENT Left 09/11/2020  Procedure: INSERTION OF ARTERIOVENOUS (AV) GORE-TEX GRAFT ARM LEFT;  Surgeon: Cherre Robins, MD;  Location: [place];  Service: Vascular;  Laterality: Left;   AV FISTULA PLACEMENT Right 11/29/2020   Procedure: Creation of Arteriovenous Fistula Right Upper Arm;  Surgeon: Rosetta Posner, MD;  Location: [place];  Service: Vascular;  Laterality: Right;   [place] Left 10/05/2019   Procedure: SECOND STAGE LEFT BASCILIC VEIN TRANSPOSITION;  Surgeon: Waynetta Sandy, MD;  Location: [place];  Service: Vascular;  Laterality: Left;   [place] Right 01/15/2021   Procedure: RIGHT UPPER ARM SECOND STAGE [place];  Surgeon: Cherre Robins, MD;  Location: [place];  Service: Vascular;  Laterality: Right;  PERIPHERAL NERVE BLOCK   Benign breast cysts      BIOPSY  02/26/2021   Procedure: BIOPSY;  Surgeon: Jackquline Denmark, MD;  Location[facility] ENDOSCOPY;  Service: Endoscopy;;   CHOLECYSTECTOMY     COLONOSCOPY  10/01/2006   SLF:Pan colonic diverticulosis and moderate internal hemorrhoids/ Otherwise no polyps, masses, inflammatory changes or AVMs/   COLONOSCOPY  2011   SLF: pancolonic diverticulosis, large internal hemorrhoids   COLONOSCOPY N/A 01/26/2016   Procedure: COLONOSCOPY;  Surgeon: [name], MD;  [facility];  Service: Endoscopy;  Laterality: N/A;  830    COLONOSCOPY WITH PROPOFOL N/A 02/10/2020   Procedure: COLONOSCOPY WITH PROPOFOL;  Surgeon: Daneil Dolin, MD;  [facility];  Service: Endoscopy;  Laterality: N/A;  10:45am   ESOPHAGEAL DILATION  02/26/2021   Procedure: ESOPHAGEAL DILATION;  Surgeon: Jackquline Denmark, MD;  [facility];  Service: Endoscopy;;   ESOPHAGOGASTRODUODENOSCOPY  11/19/2006   SLF: Large hiatal hernia without evidence of [place] ulcers/. Distal esophageal stricture, which allowed the gastroscope to pass without resistance.  A 16 mm Sav[place] later passed with mild resistance/ Normal stomach.sb bx negative   ESOPHAGOGASTRODUODENOSCOPY  10/01/2006   CWC:BJSEG hiatal hernia.  Distal esophagus without evidence of   erythema, ulceration or Barrett's esophagus   ESOPHAGOGASTRODUODENOSCOPY  2011   SLF: large hh, distal esophageal web narrowing to 46mm s/p dilation to 38mm   ESOPHAGOGASTRODUODENOSCOPY N/A 08/06/2013   SLF: 1. Stricture at the gastroesophageal junction 2. large hiatal hernia. 3. Mild erosive gastritis.   ESOPHAGOGASTRODUODENOSCOPY (EGD) WITH PROPOFOL N/A 07/19/2019   rourk: Mild erosive reflux esophagitis.  Mild Schatzki ring status post dilation.  Large hiatal hernia with at least one half of the stomach above the diaphragm.  Gastric mucosa erythematous.   ESOPHAGOGASTRODUODENOSCOPY (EGD) WITH PROPOFOL N/A 02/26/2021   Procedure: ESOPHAGOGASTRODUODENOSCOPY (EGD) WITH PROPOFOL;   Surgeon: Jackquline Denmark, MD;  Location[facility] ENDOSCOPY;  Service: Endoscopy;  Laterality: N/A;   GIVENS CAPSULE STUDY N/A 08/06/2013   INCOMPLETE-SMALL BOWLE ULCERS   IR FLUORO GUIDE CV LINE LEFT  07/29/2019   IR US GUIDE VASC ACCESS LEFT  07/29/2019   KNEE SURGERY Right    PARTIAL HYSTERECTOMY  1978   PORT-A-CATH REMOVAL Right 11/29/2020   Procedure: REMOVAL OF RIGHT CHEST PORT;  Surgeon: Rosetta Posner, MD;  Location: [place];  Service: Vascular;  Laterality: Right;   small bowel capsule  2008   negative   SPINE SURGERY     TONSILLECTOMY AND ADENOIDECTOMY     Two back surgeries/fusion     UMBILICAL HERNIA REPAIR  2010   UPPER EXTREMITY VENOGRAPHY N/A 11/22/2020   Procedure: UPPER EXTREMITY VENOGRAPHY;  Surgeon: Cherre Robins, MD;  Location: [place] CV  LAB;  Service: Cardiovascular;  Laterality: N/A;   Family History  Family History  Problem Relation Age of Onset   Colon cancer Brother        diagnosed age 91. Living.    Ulcers Sister    Diabetes Sister    Heart attack Sister    Kidney failure Sister    Stroke Sister    Ulcers Mother    Diabetes Mother    Heart attack Mother    Stroke Mother    Asthma Mother    Heart disease Mother    Cervical cancer Mother    Heart attack Brother    Heart disease Brother    Asthma Sister    Diabetes Brother    Stroke Maternal Grandmother    Heart attack Maternal Grandmother    Heart attack Other    Early death Father        MVA in his 65s   Social History  reports that she quit smoking about 59 years ago. Her smoking use included cigarettes. She started smoking about 60 years ago. She has a 1.00 pack-year smoking history. She has never used smokeless tobacco. She reports that she does not drink alcohol and does not use drugs. Allergies No Known Allergies Home medications Prior to Admission medications   Medication Sig Start Date End Date Taking? Authorizing Provider  acetaminophen (TYLENOL) 325 MG tablet Take 650 mg by mouth daily as  needed for headache (pain).   Yes [provider]  albuterol (PROVENTIL) (2.5 MG/3ML) 0.083% nebulizer solution Take 2.5 mg by nebulization 4 (four) times daily as needed for wheezing or shortness of breath.  12/22/19  Yes [provider]  allopurinol (ZYLOPRIM) 100 MG tablet Take 1 tablet (100 mg total) by mouth 2 (two) times daily. Patient taking differently: Take 100 mg by mouth at bedtime. 08/09/19  Yes Nita Sells, MD  amiodarone (PACERONE) 200 MG tablet Take 1 tablet (200 mg total) by mouth in the morning. 01/15/21  Yes Dagoberto Ligas, PA-C  apixaban (ELIQUIS) 2.5 MG TABS tablet Take 2.5 mg by mouth 2 (two) times daily.   Yes [provider]  Biotin 5000 MCG TABS Take 5,000 mcg by mouth in the morning.   Yes [provider]  Camphor-Menthol-Methyl Sal (SALONPAS) 3.08-10-08 % PTCH Place 1 patch onto the skin daily as needed (pain).   Yes [provider]  Carboxymethylcellul-Glycerin (CLEAR EYES FOR DRY EYES) 1-0.25 % SOLN Place 1 drop into both eyes daily as needed (dry eys).   Yes [provider]  Cholecalciferol (VITAMIN D3) 50 MCG (2000 UT) TABS Take 2,000 Units by mouth in the morning.   Yes [provider]  esomeprazole (NEXIUM) 40 MG capsule Take 1 capsule (40 mg total) by mouth 2 (two) times daily before a meal. 02/28/21  Yes Lavina Hamman, MD  famotidine (PEPCID) 20 MG tablet Take 20 mg by mouth every evening.   Yes [provider]  ferric citrate (AURYXIA) 1 GM 210 MG(Fe) tablet Take 210 mg by mouth 3 (three) times daily with meals.   Yes [provider]  fluticasone (FLONASE) 50 MCG/ACT nasal spray Place 1 spray into both nostrils daily. 02/06/21  Yes [provider]  Fluticasone-Umeclidin-Vilant (TRELEGY ELLIPTA) 100-62.5-25 MCG/INH AEPB Inhale 1 puff into the lungs daily at 12 noon.   Yes [provider]  gabapentin (NEURONTIN) 100 MG capsule Take 100 mg by mouth at bedtime. 12/29/19   Yes [provider]  nitroGLYCERIN (NITROSTAT) 0.4 MG  SL tablet Place 0.4 mg under the tongue every 5 (five) minutes x 3 doses as needed for chest pain. 12/07/19  Yes [provider]  ondansetron (ZOFRAN) 4 MG tablet Take 4 mg by mouth every 8 (eight) hours as needed for nausea/vomiting, nausea or vomiting. 12/29/19  Yes [provider]  promethazine (PHENERGAN) 12.5 MG tablet Take 1 tablet (12.5 mg total) by mouth every 8 (eight) hours as needed for nausea or vomiting. 03/08/21 04/07/21 Yes Carver, Elon Alas, DO  atorvastatin (LIPITOR) 40 MG tablet Take 40 mg by mouth daily in the afternoon. In the afternoon 01/15/21   Dagoberto Ligas, PA-C  midodrine (PROAMATINE) 10 MG tablet Take 1 tablet (10 mg total) by mouth 3 (three) times daily with meals. Patient not taking: Reported on 03/08/2021 04/28/20   Geradine Girt, DO  Nutritional Supplements (FEEDING SUPPLEMENT, NEPRO CARB STEADY,) LIQD Take 237 mLs by mouth 2 (two) times daily between meals. Patient not taking: Reported on 03/08/2021 02/28/21   Lavina Hamman, MD  oxyCODONE-acetaminophen (PERCOCET) 5-325 MG tablet Take 1 tablet by mouth every 6 (six) hours as needed for severe pain. Patient not taking: Reported on 03/08/2021 01/15/21   Dagoberto Ligas, PA-C  OXYGEN Inhale 2 L into the lungs See admin instructions. Use every night and as needed during the day    [provider]  [place] into the lungs at bedtime. Bipap    [provider]  sucralfate (CARAFATE) 1 GM/10ML suspension Take 10 mLs (1 g total) by mouth 2 (two) times daily for 14 days. 02/28/21 03/14/21  Lavina Hamman, MD     Vitals:   03/15/21 0840 03/15/21 1014 03/15/21 1038 03/15/21 1338  BP: (!) 100/33 (!) 96/39 (!) 102/40 (!) 118/47  Pulse: 78 74 73 76  Resp:  16 18 18   Temp:  98.1 F (36.7 C) 98.5 F (36.9 C) 99.3 F (37.4 C)  TempSrc:  Oral Oral Oral  SpO2: 95% 93% 92% 94%  Weight:      Height:       Exam Gen  alert, no distress, deconditioned elderly female No rash, cyanosis or gangrene Sclera anicteric, throat clear  No jvd or bruits Chest clear bilat to bases, no rales/ wheezing RRR no MRG Abd soft ntnd no mass or ascites +bs GU normal MS no joint effusions or deformity Ext no LE or UE edema, no wounds or ulcers Neuro is alert, Ox 3 , nf      Home meds include NW TTS   4h  400/800   73kg  2/2.5 bath  Hep none   -Venofer 100 mg IV X 10 doses (4/10 dose given)   -Mircera 100 mcg q 2 weeks (last 02/15/2021)   -Calcitriol 1.25 mcg PO TIW    Assessment/ Plan: N/V - per GI poss gastroparesis, ESRD, large hiatal hernia, multifactorial. Getting zofran for now. Per GI/ primary team Anemia - of CKD and/or GI blood loss. Is on eliquis, getting 1u prbc today for Hb 7.1.  Last OP esa 3 wks ago, will redose here w/ darbe 150 ug SQ today.  ESRD - on HD TTS.  On HD for about 2 years. Had HD yest here off schedule. Due to staffing issues we are doing HD just 2 times per week when possible. Next HD Saturday.  BP/ volume/ hypotension - no vol excess on exam. Having problems w/ low BP's at OP HD. Significant BP drops here yest on HD, is on midodrine 10 tid  here as at home. May need to raise dry wt. Get CXR. Would recommend w/u for hypotension (ECHO, etc), will d/w pmd.  COPD/ OSA AFib  Gout DM2      Rob Haani [place]ula  MD 03/15/2021, 3:58 PM  Recent Labs  Lab 03/14/21 0418 03/14/21 1247 03/14/21 2016 03/15/21 0445  WBC 19.3*  --   --  14.5*  HGB 7.4*   < > 7.4* 7.1*   < > = values in this interval not displayed.   Recent Labs  Lab 03/14/21 0902 03/15/21 0445  K 5.6* 3.8  BUN 63* 20  CREATININE 9.46* 4.52*  CALCIUM 8.7* 7.7*  PHOS 4.2  --

## 2021-03-15 NOTE — Progress Notes (Signed)
Pt has home machine. Pt asked RT to place on bedside table to allow pt to reach. RT placed on beside table, plugged heater and machine in, and placed o2 to 2L per pt home reg.

## 2021-03-15 NOTE — Progress Notes (Signed)
Patient ID: [name], female   DOB: 05/14/1946, 75 y.o.   MRN: [id number]  PROGRESS NOTE    DEWEY NEUKAM  DXA:[id number] DOB: 1946/03/03 DOA: 03/13/2021 PCP: [name], MD   Brief Narrative:  75 y.o. female with medical history significant of ESRD on HD, HTN, HLD, PAF on Eliquis, GERD with esophagitis, OSA on BiPAP, colon polyps, and prior history of COVID-19 pneumonia where hospital course was complicated by upper GI bleed, recent hospitalization from 02/25/2021-02/28/2021 for hematemesis/nausea and vomiting status post EGD on 02/26/2021 which had shown gastritis with superficial erosions and large hiatal hernia at risk for volvulus as per GI presented with nausea, vomiting and bloody bowel movements.  On presentation, hemoglobin was 9.4, WBCs of 15.6, potassium 6.6 with grossly positive blood in stool on exam as per ED provider.  Nephrology and GI were consulted.  She was given Lokelma, NovoLog and dextrose 50% for hyperkalemia.  No EKG changes of hyperkalemia.  Assessment & Plan:   GI bleeding -Patient had gross blood on rectal exam on presentation.  Had recent admission for GI bleeding and EGD on 02/26/2021 had shown gastritis with superficial erosions and large hiatal hernia at risk for volvulus as per GI -Hemoglobin 9.4 on presentation.  Hemoglobin 7.1 this morning.  As per nursing staff, patient had a small bowel movement which was apparently slightly reduced.  We will transfuse 1 unit packed red cells.  Eliquis on hold. -Monitor H&H.  Continue IV Protonix -GI following.  Follow further recommendations.  Intractable nausea and vomiting -Improving.  Continue antiemetics.  Still on clear liquid diet.  Hyperkalemia -Presented with potassium of 6.6.  Treated with Lokelma, NovoLog and dextrose 50% in the ED; no EKG changes on presentation. -Resolved.    End-stage renal disease on hemodialysis -Nephrology following.  Dialysis as per nephrology schedule  Hiatal hernia with  GERD and esophagitis -As per recent EGD.  At risk for gastric volvulus.  Abdominal x-ray showed no evidence of volvulus.  Continue IV Protonix.  Follow GI recommendations  Leukocytosis -Possibly reactive.  Improving.  Monitor  Paroxysmal A. fib -Rate controlled.  Continue amiodarone.  Eliquis on hold.  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  A1c 5.1 in March 2022.  Continue CBGs with SSI. -Patient has had episodes of hypoglycemia due to poor oral intake.  Monitor.  Hypotension -Blood pressure on the lower side.  Midodrine has been resumed.  OSA-continue BiPAP at night  Chronic respiratory failure/chronic obstructive pulmonary disease -Uses 2 L oxygen via nasal cannula as needed.  Currently respiratory status is stable.  Continue home regimen.  Hyperlipidemia -Continue Lipitor  Resting tremor -Outpatient follow-up with neurology  Obesity -Outpatient follow-up  Generalized deconditioning -PT eval  DVT prophylaxis: SCDs Code Status: Full Family Communication: None at bedside  disposition Plan: Status is: Observation  The patient will require care spanning > 2 midnights and should be moved to inpatient because: Inpatient level of care appropriate due to severity of illness  Dispo: The patient is from: Home              Anticipated d/c is to: Home              Patient currently is not medically stable to d/c.   Difficult to place patient No  Consultants: Nephrology/GI  Procedures: None  Antimicrobials: None   Subjective: Patient seen and examined at bedside.  Still feels weak.  Nursing staff reported a small bowel  movement which was slightly reduced.  No worsening abdominal pain or vomiting. Objective: Vitals:   03/14/21 1720 03/14/21 2135 03/15/21 0500 03/15/21 0538  BP: (!) 95/38 (!) 90/31  (!) 120/39  Pulse: 86 90  85  Resp: 18 20  18   Temp:  98.6 F (37 C)  98 F (36.7 C)  TempSrc:  Oral   Oral  SpO2:  91%  91%  Weight:   76.7 kg   Height:        Intake/Output Summary (Last 24 hours) at 03/15/2021 0740 Last data filed at 03/14/2021 1833 Gross per 24 hour  Intake 180 ml  Output -500 ml  Net 680 ml    Filed Weights   03/13/21 1352 03/14/21 0825 03/15/21 0500  Weight: 74 kg 76.5 kg 76.7 kg    Examination:  General exam: No acute distress.  Looks chronically ill and deconditioned. Respiratory system: Decreased breath sounds at bases bilaterally with some crackles.   Cardiovascular system: Rate controlled, S1-S2 heard Gastrointestinal system: Abdomen is obese, mildly distended, soft and nontender.  Bowel sounds are heard  extremities: Bilateral lower extremity trace edema present; no clubbing  central nervous system: Extremely slow to respond; alert.  Poor historian.  No focal neurological deficits.  Moves extremities Skin: No obvious ecchymosis/lesions  psychiatry: Affect is flat  Data Reviewed: I have personally reviewed following labs and imaging studies  CBC: Recent Labs  Lab 03/13/21 1106 03/13/21 1511 03/13/21 1637 03/13/21 2022 03/14/21 0044 03/14/21 0418 03/14/21 1247 03/14/21 2016 03/15/21 0445  WBC 15.6*  --  17.1* 16.9* 20.0* 19.3*  --   --  14.5*  NEUTROABS 12.4*  --   --   --   --   --   --   --  12.1*  HGB 9.4*   < > 8.6* 8.1* 7.4* 7.4* 7.4* 7.4* 7.1*  HCT 29.0*   < > 26.6* 24.3* 23.4* 23.2* 22.4* 22.9* 21.8*  MCV 91.8  --  90.2 89.3 91.4 90.6  --   --  90.8  PLT 390  --  400 357 379 316  --   --  301   < > = values in this interval not displayed.    Basic Metabolic Panel: Recent Labs  Lab 03/13/21 1106 03/13/21 1511 03/13/21 2022 03/14/21 0902 03/15/21 0445  NA 138 137 141 139 136  K 6.6* 6.0* 5.8* 5.6* 3.8  CL 96* 101 98 97* 98  CO2 28  --  26 29 29   GLUCOSE 118* 184* 135* 115* 72  BUN 57* 52* 62* 63* 20  CREATININE 8.49* 9.30* 8.78* 9.46* 4.52*  CALCIUM 9.3  --  9.2 8.7* 7.7*  PHOS  --   --   --  4.2  --      GFR: Estimated Creatinine Clearance: 8.8 mL/min (A) (by C-G formula based on SCr of 4.52 mg/dL (H)). Liver Function Tests: Recent Labs  Lab 03/13/21 1106 03/14/21 0902  AST 10*  --   ALT 9  --   ALKPHOS 92  --   BILITOT 0.5  --   PROT 6.0*  --   ALBUMIN 2.0* 1.6*    Recent Labs  Lab 03/13/21 1106  LIPASE 26    No results for input(s): AMMONIA in the last 168 hours. Coagulation Profile: No results for input(s): INR, PROTIME in the last 168 hours. Cardiac Enzymes: No results for input(s): CKTOTAL, CKMB, CKMBINDEX, TROPONINI in the last 168 hours. BNP (last 3 results) No results for input(s): PROBNP  in the last 8760 hours. HbA1C: No results for input(s): HGBA1C in the last 72 hours. CBG: Recent Labs  Lab 03/14/21 1700 03/14/21 2139 03/15/21 0643 03/15/21 0645 03/15/21 0708  GLUCAP 80 81 63* 64* 62*    Lipid Profile: Recent Labs    03/14/21 0044  CHOL 115  HDL 40*  LDLCALC 60  TRIG 75  CHOLHDL 2.9    Thyroid Function Tests: No results for input(s): TSH, T4TOTAL, FREET4, T3FREE, THYROIDAB in the last 72 hours. Anemia Panel: No results for input(s): VITAMINB12, FOLATE, FERRITIN, TIBC, IRON, RETICCTPCT in the last 72 hours. Sepsis Labs: No results for input(s): PROCALCITON, LATICACIDVEN in the last 168 hours.  Recent Results (from the past 240 hour(s))  Resp Panel by RT-PCR (Flu A&B, Covid) Nasopharyngeal Swab     Status: None   Collection Time: 03/13/21  2:10 PM   Specimen: Nasopharyngeal Swab; Nasopharyngeal(NP) swabs in vial transport medium  Result Value Ref Range Status   SARS Coronavirus 2 by RT PCR NEGATIVE NEGATIVE Final    Comment: (NOTE) SARS-CoV-2 target nucleic acids are NOT DETECTED.  The SARS-CoV-2 RNA is generally detectable in upper respiratory specimens during the acute phase of infection. The lowest concentration of SARS-CoV-2 viral copies this assay can detect is 138 copies/mL. A negative result does not preclude  SARS-Cov-2 infection and should not be used as the sole basis for treatment or other patient management decisions. A negative result may occur with  improper specimen collection/handling, submission of specimen other than nasopharyngeal swab, presence of viral mutation(s) within the areas targeted by this assay, and inadequate number of viral copies(<138 copies/mL). A negative result must be combined with clinical observations, patient history, and epidemiological information. The expected result is Negative.  Fact Sheet for Patients:  EntrepreneurPulse.com.au  Fact Sheet for Healthcare Providers:  IncredibleEmployment.be  This test is no t yet approved or cleared by the [place] FDA and  has been authorized for detection and/or diagnosis of SARS-CoV-2 by FDA under an Emergency Use Authorization (EUA). This EUA will remain  in effect (meaning this test can be used) for the duration of the COVID-19 declaration under Section 564(b)(1) of the Act, 21 U.S.C.section 360bbb-3(b)(1), unless the authorization is terminated  or revoked sooner.       Influenza A by PCR NEGATIVE NEGATIVE Final   Influenza B by PCR NEGATIVE NEGATIVE Final    Comment: (NOTE) The Xpert Xpress SARS-CoV-2/FLU/RSV plus assay is intended as an aid in the diagnosis of influenza from Nasopharyngeal swab specimens and should not be used as a sole basis for treatment. Nasal washings and aspirates are unacceptable for Xpert Xpress SARS-CoV-2/FLU/RSV testing.  Fact Sheet for Patients: EntrepreneurPulse.com.au  Fact Sheet for Healthcare Providers: IncredibleEmployment.be  This test is not yet approved or cleared by the [place] FDA and has been authorized for detection and/or diagnosis of SARS-CoV-2 by FDA under an Emergency Use Authorization (EUA). This EUA will remain in effect (meaning this test can be used) for the duration of  the COVID-19 declaration under Section 564(b)(1) of the Act, 21 U.S.C. section 360bbb-3(b)(1), unless the authorization is terminated or revoked.  Performed at [facility], [place] [address] [place], [place] [address]           Radiology Studies: DG Abd 1 View  Result Date: 03/13/2021 CLINICAL DATA:  Intractable nausea and vomiting. EXAM: ABDOMEN - 1 VIEW COMPARISON:  07/17/2019 FINDINGS: There is diffuse gaseous distension of small bowel with air in stool seen scattered along  the course of the colon. No overt small bowel obstruction by x-ray. Lower lumbar fusion hardware evident. Atherosclerotic calcification noted in the dominant arterial anatomy of the lower abdomen and pelvis. Bones are diffusely demineralized. IMPRESSION: Diffuse gaseous distention of small bowel with air in stool scattered along the length of the colon. Bowel gas pattern is not overtly obstructive although component of ileus or gastroenteritis a consideration. CT imaging could be used to further evaluate as clinically warranted. Electronically Signed   By: Misty Stanley M.D.   On: 03/13/2021 20:14   CT HEAD WO CONTRAST (5MM)  Result Date: 03/14/2021 CLINICAL DATA:  Nausea and vomiting EXAM: CT HEAD WITHOUT CONTRAST TECHNIQUE: Contiguous axial images were obtained from the base of the skull through the vertex without intravenous contrast. COMPARISON:  CT head 04/24/2020 FINDINGS: Brain: Mild cerebral atrophy. Mild white matter hypodensity bilaterally which appears chronic and unchanged. Negative for acute infarct, hemorrhage, hydrocephalus Vascular: Negative for hyperdense vessel Skull: Negative Sinuses/Orbits: Paranasal sinuses clear.  No orbital mass Other: None IMPRESSION: No acute abnormality no change from prior CT. Mild atrophy and mild chronic microvascular ischemic change in the white matter. Electronically Signed   By: Franchot Gallo M.D.   On: 03/14/2021 18:03        Scheduled Meds:  allopurinol  100  mg Oral QHS   amiodarone  200 mg Oral q AM   atorvastatin  40 mg Oral Q1500   calcitRIOL  1.25 mcg Oral Q M,W,F-HD   Chlorhexidine Gluconate Cloth  6 each Topical Q0600   famotidine  20 mg Oral QPM   feeding supplement (NEPRO CARB STEADY)  237 mL Oral BID BM   ferric citrate  210 mg Oral TID WC   fluticasone furoate-vilanterol  1 puff Inhalation Daily   gabapentin  100 mg Oral QHS   insulin aspart  0-6 Units Subcutaneous TID WC   midodrine  10 mg Oral TID WC   ondansetron  4 mg Oral Q8H   pantoprazole (PROTONIX) IV  40 mg Intravenous Daily   sodium chloride flush  3 mL Intravenous Q12H   umeclidinium bromide  1 puff Inhalation Daily   Continuous Infusions:  sodium chloride     promethazine (PHENERGAN) injection (IM or IVPB) 12.5 mg (03/14/21 1726)          Aline August, MD [facility] 03/15/2021, 7:40 AM

## 2021-03-15 NOTE — Progress Notes (Signed)
Hypoglycemic Event  CBG: 69  Treatment: 4 oz juice/soda  Symptoms: None  Follow-up CBG: Time:1152 CBG Result:69  Possible Reasons for Event: Inadequate meal intake   Treatment: 1 tube glucose gel  Symptoms: None  Follow-up CBG: Time:1213 CBG Result:83  Possible Reasons for Event: Inadequate meal intake  Comments/MD notified:hypoglycemic protocol followed    [name]

## 2021-03-15 NOTE — Progress Notes (Signed)
Hypoglycemic Event  CBG: 63 , 64  Treatment: 4 oz juice/soda  Symptoms: None  Follow-up CBG: Time:0710 CBG Result:62 Standing order for Glucose Gel placed. Given at 0717 Dayshift to perform followup blood sugar.   Possible Reasons for Event: Inadequate meal intake  Comments/MD notified: Dr. Starla Link at [phone] via [place].     [name]

## 2021-03-16 DIAGNOSIS — Z992 Dependence on renal dialysis: Secondary | ICD-10-CM | POA: Diagnosis not present

## 2021-03-16 DIAGNOSIS — R112 Nausea with vomiting, unspecified: Secondary | ICD-10-CM | POA: Diagnosis not present

## 2021-03-16 DIAGNOSIS — K449 Diaphragmatic hernia without obstruction or gangrene: Secondary | ICD-10-CM | POA: Diagnosis not present

## 2021-03-16 DIAGNOSIS — N186 End stage renal disease: Secondary | ICD-10-CM | POA: Diagnosis not present

## 2021-03-16 DIAGNOSIS — K922 Gastrointestinal hemorrhage, unspecified: Secondary | ICD-10-CM | POA: Diagnosis not present

## 2021-03-16 DIAGNOSIS — D631 Anemia in chronic kidney disease: Secondary | ICD-10-CM | POA: Diagnosis not present

## 2021-03-16 LAB — HEMOGLOBIN AND HEMATOCRIT, BLOOD
HCT: 27.7 % — ABNORMAL LOW (ref 36.0–46.0)
Hemoglobin: 9.4 g/dL — ABNORMAL LOW (ref 12.0–15.0)

## 2021-03-16 LAB — TYPE AND SCREEN
ABO/RH(D): O POS
Antibody Screen: NEGATIVE
Unit division: 0

## 2021-03-16 LAB — GLUCOSE, CAPILLARY
Glucose-Capillary: 71 mg/dL (ref 70–99)
Glucose-Capillary: 117 mg/dL — ABNORMAL HIGH (ref 70–99)
Glucose-Capillary: 98 mg/dL (ref 70–99)

## 2021-03-16 LAB — CBC WITH DIFFERENTIAL/PLATELET
Abs Immature Granulocytes: 0.38 10*3/uL — ABNORMAL HIGH (ref 0.00–0.07)
Abs Immature Granulocytes: 0.74 10*3/uL — ABNORMAL HIGH (ref 0.00–0.07)
Basophils Absolute: 0.1 10*3/uL (ref 0.0–0.1)
Basophils Absolute: 0.1 10*3/uL (ref 0.0–0.1)
Basophils Relative: 1 %
Basophils Relative: 1 %
Eosinophils Absolute: 0.3 10*3/uL (ref 0.0–0.5)
Eosinophils Absolute: 0.3 10*3/uL (ref 0.0–0.5)
Eosinophils Relative: 3 %
Eosinophils Relative: 2 %
HCT: 26.9 % — ABNORMAL LOW (ref 36.0–46.0)
HCT: 28.5 % — ABNORMAL LOW (ref 36.0–46.0)
Hemoglobin: 8.5 g/dL — ABNORMAL LOW (ref 12.0–15.0)
Hemoglobin: 9.3 g/dL — ABNORMAL LOW (ref 12.0–15.0)
Immature Granulocytes: 4 %
Immature Granulocytes: 5 %
Lymphocytes Relative: 10 %
Lymphocytes Relative: 9 %
Lymphs Abs: 1.1 10*3/uL (ref 0.7–4.0)
Lymphs Abs: 1.3 10*3/uL (ref 0.7–4.0)
MCH: 28.9 pg (ref 26.0–34.0)
MCH: 28.9 pg (ref 26.0–34.0)
MCHC: 31.6 g/dL (ref 30.0–36.0)
MCHC: 32.6 g/dL (ref 30.0–36.0)
MCV: 91.5 fL (ref 80.0–100.0)
MCV: 88.5 fL (ref 80.0–100.0)
Monocytes Absolute: 1.1 10*3/uL — ABNORMAL HIGH (ref 0.1–1.0)
Monocytes Absolute: 1.2 10*3/uL — ABNORMAL HIGH (ref 0.1–1.0)
Monocytes Relative: 10 %
Monocytes Relative: 9 %
Neutro Abs: 7.8 10*3/uL — ABNORMAL HIGH (ref 1.7–7.7)
Neutro Abs: 10.8 10*3/uL — ABNORMAL HIGH (ref 1.7–7.7)
Neutrophils Relative %: 72 %
Neutrophils Relative %: 74 %
Platelets: 295 10*3/uL (ref 150–400)
Platelets: 364 10*3/uL (ref 150–400)
RBC: 2.94 MIL/uL — ABNORMAL LOW (ref 3.87–5.11)
RBC: 3.22 MIL/uL — ABNORMAL LOW (ref 3.87–5.11)
RDW: 15.6 % — ABNORMAL HIGH (ref 11.5–15.5)
RDW: 15.3 % (ref 11.5–15.5)
WBC: 10.6 10*3/uL — ABNORMAL HIGH (ref 4.0–10.5)
WBC: 14.4 10*3/uL — ABNORMAL HIGH (ref 4.0–10.5)
nRBC: 0 % (ref 0.0–0.2)
nRBC: 0 % (ref 0.0–0.2)

## 2021-03-16 LAB — BASIC METABOLIC PANEL
Anion gap: 11 (ref 5–15)
Anion gap: 14 (ref 5–15)
BUN: 30 mg/dL — ABNORMAL HIGH (ref 8–23)
BUN: 36 mg/dL — ABNORMAL HIGH (ref 8–23)
CO2: 28 mmol/L (ref 22–32)
CO2: 27 mmol/L (ref 22–32)
Calcium: 8.4 mg/dL — ABNORMAL LOW (ref 8.9–10.3)
Calcium: 8.6 mg/dL — ABNORMAL LOW (ref 8.9–10.3)
Chloride: 97 mmol/L — ABNORMAL LOW (ref 98–111)
Chloride: 94 mmol/L — ABNORMAL LOW (ref 98–111)
Creatinine, Ser: 5.78 mg/dL — ABNORMAL HIGH (ref 0.44–1.00)
Creatinine, Ser: 6.89 mg/dL — ABNORMAL HIGH (ref 0.44–1.00)
GFR, Estimated: 7 mL/min — ABNORMAL LOW (ref >60)
GFR, Estimated: 6 mL/min — ABNORMAL LOW (ref >60)
Glucose, Bld: 75 mg/dL (ref 70–99)
Glucose, Bld: 111 mg/dL — ABNORMAL HIGH (ref 70–99)
Potassium: 3.9 mmol/L (ref 3.5–5.1)
Potassium: 4.1 mmol/L (ref 3.5–5.1)
Sodium: 136 mmol/L (ref 135–145)
Sodium: 135 mmol/L (ref 135–145)

## 2021-03-16 LAB — BPAM RBC
Blood Product Expiration Date: 202209142359
ISSUE DATE / TIME: 202208111021
Unit Type and Rh: 5100

## 2021-03-16 LAB — TSH: TSH: 0.691 u[IU]/mL (ref 0.350–4.500)

## 2021-03-16 LAB — CORTISOL-AM, BLOOD: Cortisol - AM: 25.3 ug/dL — ABNORMAL HIGH (ref 6.7–22.6)

## 2021-03-16 LAB — MAGNESIUM: Magnesium: 2 mg/dL (ref 1.7–2.4)

## 2021-03-16 MED ORDER — IOHEXOL 9 MG/ML PO SOLN
ORAL | Status: AC
  Filled 2021-03-16: qty 1000

## 2021-03-16 NOTE — Progress Notes (Signed)
Patient ID: [name], female   DOB: 01/22/1946, 75 y.o.   MRN: [id number]  PROGRESS NOTE    [name]  JKK:[id number] DOB: February 04, 1946 DOA: 03/13/2021 PCP: [name], MD   Brief Narrative:  75 y.o. female with medical history significant of ESRD on HD, HTN, HLD, PAF on Eliquis, GERD with esophagitis, OSA on BiPAP, colon polyps, and prior history of COVID-19 pneumonia where hospital course was complicated by upper GI bleed, recent hospitalization from 02/25/2021-02/28/2021 for hematemesis/nausea and vomiting status post EGD on 02/26/2021 which had shown gastritis with superficial erosions and large hiatal hernia at risk for volvulus as per GI presented with nausea, vomiting and bloody bowel movements.  On presentation, hemoglobin was 9.4, WBCs of 15.6, potassium 6.6 with grossly positive blood in stool on exam as per ED provider.  Nephrology and GI were consulted.  She was given Lokelma, NovoLog and dextrose 50% for hyperkalemia.  No EKG changes of hyperkalemia.  Assessment & Plan:   GI bleeding -Patient had gross blood on rectal exam on presentation.  Had recent admission for GI bleeding and EGD on 02/26/2021 had shown gastritis with superficial erosions and large hiatal hernia at risk for volvulus as per GI -Hemoglobin 9.4 on presentation.  Hemoglobin 8.5 this morning.  Status post 1 unit packed red cell transfusion on 03/15/2021.  Eliquis on hold. -Monitor H&H.  Continue IV Protonix -GI following.  Follow further recommendations.  Intractable nausea and vomiting -Improving.  Continue antiemetics.  Diet advancement as per GI.  Hyperkalemia -Presented with potassium of 6.6.  Treated with Lokelma, NovoLog and dextrose 50% in the ED; no EKG changes on presentation. -Resolved.    End-stage renal disease on hemodialysis -Nephrology following.  Dialysis as per nephrology schedule  Hiatal hernia with GERD and esophagitis -As per recent EGD.  At risk for gastric volvulus.   Abdominal x-ray showed no evidence of volvulus.  Continue IV Protonix.  Follow GI recommendations.  I have consulted general surgery as per GI recommendations.  Leukocytosis -Possibly reactive.  Resolved.  Monitor  Paroxysmal A. fib -Rate controlled.  Continue amiodarone.  Eliquis on hold.  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  A1c 5.1 in March 2022.  Continue CBGs with SSI. -Patient has had episodes of hypoglycemia due to poor oral intake.  Monitor.  Hypotension -Blood pressure on the lower side.  Midodrine has been resumed.  OSA-continue BiPAP at night  Chronic respiratory failure/chronic obstructive pulmonary disease -Uses 2 L oxygen via nasal cannula as needed.  Currently respiratory status is stable.  Continue home regimen.  Hyperlipidemia -Continue Lipitor  Resting tremor -Outpatient follow-up with neurology  Obesity -Outpatient follow-up  Generalized deconditioning -PT recommends home health PT  DVT prophylaxis: SCDs Code Status: Full Family Communication: None at bedside  disposition Plan: Status is: Inpatient because: Inpatient level of care appropriate due to severity of illness  Dispo: The patient is from: Home              Anticipated d/c is to: Home              Patient currently is not medically stable to d/c.   Difficult to place patient No  Consultants: Nephrology/GI  Procedures: Echo  Antimicrobials: None   Subjective: Patient seen and examined at bedside.  Still feels weak and tired.  No worsening abdominal pain, vomiting, fever or shortness of breath reported.  Nursing staff reports episodes of low blood sugars.  Objective: Vitals:   03/15/21 1752 03/15/21 2104 03/16/21 0527 03/16/21 0621  BP: (!) 118/45 124/60 (!) 124/36 (!) 109/43  Pulse: 69 74 73 76  Resp: 16 16 18 16   Temp: 98.4 F (36.9 C) 98.4 F (36.9 C) 98.6 F (37 C)   TempSrc: Oral  Oral   SpO2: 100% 98% 95%  95%  Weight:  76.7 kg    Height:        Intake/Output Summary (Last 24 hours) at 03/16/2021 0725 Last data filed at 03/15/2021 2104 Gross per 24 hour  Intake 1328 ml  Output 0 ml  Net 1328 ml    Filed Weights   03/14/21 0825 03/15/21 0500 03/15/21 2104  Weight: 76.5 kg 76.7 kg 76.7 kg    Examination:  General exam: Currently on 2 L oxygen via nasal cannula.  No distress.  Looks chronically ill and deconditioned. Respiratory system: Bilateral decreased breath sounds at bases with scattered crackles  cardiovascular system: S1-S2 heard, rate controlled Gastrointestinal system: Abdomen is obese, distended slightly, soft and nontender.  Normal bowel sounds heard  extremities: No cyanosis; trace lower extremity edema present central nervous system: Awake, still very slow to respond.  Poor historian.  No focal neurological deficits.  Moving extremities  skin: No obvious ecchymosis/lesions  psychiatry: Flat affect  Data Reviewed: I have personally reviewed following labs and imaging studies  CBC: Recent Labs  Lab 03/13/21 1106 03/13/21 1511 03/13/21 2022 03/14/21 0044 03/14/21 0418 03/14/21 1247 03/14/21 2016 03/15/21 0445 03/15/21 1806 03/15/21 2005 03/16/21 0618  WBC 15.6*   < > 16.9* 20.0* 19.3*  --   --  14.5*  --   --  10.6*  NEUTROABS 12.4*  --   --   --   --   --   --  12.1*  --   --  7.8*  HGB 9.4*   < > 8.1* 7.4* 7.4*   < > 7.4* 7.1* 8.5* 9.1* 8.5*  HCT 29.0*   < > 24.3* 23.4* 23.2*   < > 22.9* 21.8* 26.6* 27.5* 26.9*  MCV 91.8   < > 89.3 91.4 90.6  --   --  90.8  --   --  91.5  PLT 390   < > 357 379 316  --   --  301  --   --  295   < > = values in this interval not displayed.    Basic Metabolic Panel: Recent Labs  Lab 03/13/21 1106 03/13/21 1511 03/13/21 2022 03/14/21 0902 03/15/21 0445 03/16/21 0618  NA 138 137 141 139 136 136  K 6.6* 6.0* 5.8* 5.6* 3.8 3.9  CL 96* 101 98 97* 98 97*  CO2 28  --  26 29 29 28   GLUCOSE 118* 184* 135* 115* 72 75  BUN  57* 52* 62* 63* 20 30*  CREATININE 8.49* 9.30* 8.78* 9.46* 4.52* 5.78*  CALCIUM 9.3  --  9.2 8.7* 7.7* 8.4*  PHOS  --   --   --  4.2  --   --     GFR: Estimated Creatinine Clearance: 6.9 mL/min (A) (by C-G formula based on SCr of 5.78 mg/dL (H)). Liver Function Tests: Recent Labs  Lab 03/13/21 1106 03/14/21 0902  AST 10*  --   ALT 9  --   ALKPHOS 92  --   BILITOT 0.5  --   PROT 6.0*  --   ALBUMIN 2.0* 1.6*    Recent Labs  Lab 03/13/21 1106  LIPASE 26  No results for input(s): AMMONIA in the last 168 hours. Coagulation Profile: No results for input(s): INR, PROTIME in the last 168 hours. Cardiac Enzymes: No results for input(s): CKTOTAL, CKMB, CKMBINDEX, TROPONINI in the last 168 hours. BNP (last 3 results) No results for input(s): PROBNP in the last 8760 hours. HbA1C: No results for input(s): HGBA1C in the last 72 hours. CBG: Recent Labs  Lab 03/15/21 1155 03/15/21 1219 03/15/21 1612 03/15/21 2105 03/16/21 0640  GLUCAP 69* 83 89 82 71    Lipid Profile: Recent Labs    03/14/21 0044  CHOL 115  HDL 40*  LDLCALC 60  TRIG 75  CHOLHDL 2.9    Thyroid Function Tests: No results for input(s): TSH, T4TOTAL, FREET4, T3FREE, THYROIDAB in the last 72 hours. Anemia Panel: No results for input(s): VITAMINB12, FOLATE, FERRITIN, TIBC, IRON, RETICCTPCT in the last 72 hours. Sepsis Labs: No results for input(s): PROCALCITON, LATICACIDVEN in the last 168 hours.  Recent Results (from the past 240 hour(s))  Resp Panel by RT-PCR (Flu A&B, Covid) Nasopharyngeal Swab     Status: None   Collection Time: 03/13/21  2:10 PM   Specimen: Nasopharyngeal Swab; Nasopharyngeal(NP) swabs in vial transport medium  Result Value Ref Range Status   SARS Coronavirus 2 by RT PCR NEGATIVE NEGATIVE Final    Comment: (NOTE) SARS-CoV-2 target nucleic acids are NOT DETECTED.  The SARS-CoV-2 RNA is generally detectable in upper respiratory specimens during the acute phase of infection.  The lowest concentration of SARS-CoV-2 viral copies this assay can detect is 138 copies/mL. A negative result does not preclude SARS-Cov-2 infection and should not be used as the sole basis for treatment or other patient management decisions. A negative result may occur with  improper specimen collection/handling, submission of specimen other than nasopharyngeal swab, presence of viral mutation(s) within the areas targeted by this assay, and inadequate number of viral copies(<138 copies/mL). A negative result must be combined with clinical observations, patient history, and epidemiological information. The expected result is Negative.  Fact Sheet for Patients:  EntrepreneurPulse.com.au  Fact Sheet for Healthcare Providers:  IncredibleEmployment.be  This test is no t yet approved or cleared by the [place] FDA and  has been authorized for detection and/or diagnosis of SARS-CoV-2 by FDA under an Emergency Use Authorization (EUA). This EUA will remain  in effect (meaning this test can be used) for the duration of the COVID-19 declaration under Section 564(b)(1) of the Act, 21 U.S.C.section 360bbb-3(b)(1), unless the authorization is terminated  or revoked sooner.       Influenza A by PCR NEGATIVE NEGATIVE Final   Influenza B by PCR NEGATIVE NEGATIVE Final    Comment: (NOTE) The Xpert Xpress SARS-CoV-2/FLU/RSV plus assay is intended as an aid in the diagnosis of influenza from Nasopharyngeal swab specimens and should not be used as a sole basis for treatment. Nasal washings and aspirates are unacceptable for Xpert Xpress SARS-CoV-2/FLU/RSV testing.  Fact Sheet for Patients: EntrepreneurPulse.com.au  Fact Sheet for Healthcare Providers: IncredibleEmployment.be  This test is not yet approved or cleared by the [place] FDA and has been authorized for detection and/or diagnosis of SARS-CoV-2 by FDA  under an Emergency Use Authorization (EUA). This EUA will remain in effect (meaning this test can be used) for the duration of the COVID-19 declaration under Section 564(b)(1) of the Act, 21 U.S.C. section 360bbb-3(b)(1), unless the authorization is terminated or revoked.  Performed at [facility], [place] [address], [place],  [address]  Radiology Studies: CT HEAD WO CONTRAST (5MM)  Result Date: 03/14/2021 CLINICAL DATA:  Nausea and vomiting EXAM: CT HEAD WITHOUT CONTRAST TECHNIQUE: Contiguous axial images were obtained from the base of the skull through the vertex without intravenous contrast. COMPARISON:  CT head 04/24/2020 FINDINGS: Brain: Mild cerebral atrophy. Mild white matter hypodensity bilaterally which appears chronic and unchanged. Negative for acute infarct, hemorrhage, hydrocephalus Vascular: Negative for hyperdense vessel Skull: Negative Sinuses/Orbits: Paranasal sinuses clear.  No orbital mass Other: None IMPRESSION: No acute abnormality no change from prior CT. Mild atrophy and mild chronic microvascular ischemic change in the white matter. Electronically Signed   By: Franchot Gallo M.D.   On: 03/14/2021 18:03   DG CHEST PORT 1 VIEW  Result Date: 03/15/2021 CLINICAL DATA:  End-stage renal disease EXAM: PORTABLE CHEST 1 VIEW COMPARISON:  02/25/2021 FINDINGS: Left-sided central venous catheter tip over the SVC. Mild cardiomegaly with vascular congestion. No pleural effusion or pneumothorax. Moderate to large hiatal hernia IMPRESSION: 1. Mild cardiomegaly with vascular congestion 2. Hiatal hernia Electronically Signed   By: Donavan Foil M.D.   On: 03/15/2021 19:31   ECHOCARDIOGRAM COMPLETE  Result Date: 03/15/2021    ECHOCARDIOGRAM REPORT   Patient Name:   [name] Date of Exam: 03/15/2021 Medical Rec #:  [id number]          Height:       56.0 in Accession #:    [id number]         Weight:       169.1 lb Date of Birth:  June 23, 1946          BSA:           1.653 m Patient Age:    75 years           BP:           118/47 mmHg Patient Gender: F                  HR:           76 bpm. Exam Location:  Inpatient Procedure: Intracardiac Opacification Agent, Cardiac Doppler, Color Doppler and            2D Echo Indications:    Dyspnea R06.00  History:        Patient has prior history of Echocardiogram examinations, most                 recent 07/27/2019. Arrythmias:Atrial Fibrillation; Risk                 Factors:Hypertension, Dyslipidemia and Diabetes. GERD. End stage                 renal disease. Chronic respiratory failure/chronic obstructive                 pulmonary disease.  Sonographer:    Darlina Sicilian RDCS Referring Phys: [id number] [facility]  Sonographer Comments: No parasternal window, suboptimal apical window and suboptimal subcostal window. Hemodialysis Catheter Left Subclavian limits parasternal imaging. IMPRESSIONS  1. Technically difficult study - essentially only Definity contrast images were interpretable  2. Left ventricular ejection fraction, by estimation, is 65 to 70%. The left ventricle has normal function. The left ventricle has no regional wall motion abnormalities. Left ventricular diastolic parameters are consistent with Grade I diastolic dysfunction (impaired relaxation). Elevated left ventricular end-diastolic pressure.  3. Right ventricular systolic function was not well visualized. The right ventricular size is not well visualized.  4. The mitral  valve was not well visualized. No evidence of mitral valve regurgitation.  5. The aortic valve was not well visualized. Aortic valve regurgitation is not visualized. Comparison(s): Changes from prior study are noted. 07/27/2019: LVEF 55-60%. FINDINGS  Left Ventricle: Left ventricular ejection fraction, by estimation, is 65 to 70%. The left ventricle has normal function. The left ventricle has no regional wall motion abnormalities. Definity contrast agent was given IV to delineate the left  ventricular  endocardial borders. The left ventricular internal cavity size was normal in size. There is no left ventricular hypertrophy. Left ventricular diastolic function could not be evaluated due to atrial fibrillation. Left ventricular diastolic parameters are  consistent with Grade I diastolic dysfunction (impaired relaxation). Elevated left ventricular end-diastolic pressure. Right Ventricle: The right ventricular size is not well visualized. Right vetricular wall thickness was not well visualized. Right ventricular systolic function was not well visualized. Left Atrium: Left atrial size was normal in size. Right Atrium: Right atrial size was normal in size. Pericardium: The pericardium was not well visualized. Mitral Valve: The mitral valve was not well visualized. No evidence of mitral valve regurgitation. Tricuspid Valve: The tricuspid valve is grossly normal. Tricuspid valve regurgitation is trivial. Aortic Valve: The aortic valve was not well visualized. Aortic valve regurgitation is not visualized. Pulmonic Valve: The pulmonic valve was not well visualized. Pulmonic valve regurgitation is not visualized. Aorta: The aortic root was not well visualized. IAS/Shunts: No atrial level shunt detected by color flow Doppler.   Diastology LV e' medial:    4.50 cm/s LV E/e' medial:  26.7 LV e' lateral:   6.75 cm/s LV E/e' lateral: 17.8  LEFT ATRIUM           Index LA Vol (A4C): 49.5 ml 29.95 ml/m  AORTIC VALVE LVOT Vmax:   101.00 cm/s LVOT Vmean:  61.700 cm/s LVOT VTI:    0.193 m MITRAL VALVE                TRICUSPID VALVE MV Area (PHT): 3.36 cm     TR Peak grad:   42.5 mmHg MV Decel Time: 226 msec     TR Vmax:        326.00 cm/s MV E velocity: 120.00 cm/s MV A velocity: 98.50 cm/s   SHUNTS MV E/A ratio:  1.22         Systemic VTI: 0.19 m Lyman Bishop MD Electronically signed by Lyman Bishop MD Signature Date/Time: 03/15/2021/3:10:25 PM    Final         Scheduled Meds:  sodium chloride   Intravenous  Once   allopurinol  100 mg Oral QHS   amiodarone  200 mg Oral q AM   atorvastatin  40 mg Oral Q1500   calcitRIOL  1.25 mcg Oral Q M,W,F-HD   Chlorhexidine Gluconate Cloth  6 each Topical Q0600   darbepoetin (ARANESP) injection - NON-DIALYSIS  150 mcg Subcutaneous Q Thu-1800   famotidine  20 mg Oral QPM   feeding supplement (NEPRO CARB STEADY)  237 mL Oral BID BM   ferric citrate  210 mg Oral TID WC   fluticasone furoate-vilanterol  1 puff Inhalation Daily   gabapentin  100 mg Oral QHS   insulin aspart  0-6 Units Subcutaneous TID WC   midodrine  10 mg Oral TID WC   ondansetron  4 mg Oral Q8H   pantoprazole (PROTONIX) IV  40 mg Intravenous Daily   sodium chloride flush  3 mL Intravenous Q12H   umeclidinium bromide  1 puff Inhalation Daily   Continuous Infusions:  sodium chloride     promethazine (PHENERGAN) injection (IM or IVPB) 12.5 mg (03/14/21 1726)          Aline August, MD [facility] 03/16/2021, 7:25 AM

## 2021-03-16 NOTE — Progress Notes (Signed)
Patient will self place home bipap when ready.

## 2021-03-16 NOTE — Progress Notes (Addendum)
Patient ID: [name], female   DOB: [birth year], 75 y.o.   MRN: [id number]      Attending physician's note   I have taken an interval history, reviewed the chart and examined the patient. I agree with the Advanced Practitioner's note, impression, and recommendations as outlined.   Surgical service reviewed her case and agree that she would be likely high risk for surgery, and no plan to pursue surgical intervention as an inpatient.  I discussed that with the patient today, and she tends to agree with elevated surgical risk for hernia repair.  Still with nausea, but states this is overall improved.  Tolerating some p.o. intake.  - CT abdomen/pelvis for persistent abdominal pain - Could probably benefit from outpatient GES - Continue scheduled antiemetics for now.  If no significant improvement and CT otherwise unrevealing, can change to course of scheduled Reglan IV x72 hours for promotility benefit  [place], DO, FACG [phone] office         Progress Note   Subjective   Day # 3  CC: Nausea vomiting, abdominal pain, hematochezia  WBC 10.6/hemoglobin 8.5/hematocrit 26.9 post 1 unit BUN 30/creatinine 5.78 AM cortisol 25.3  Patient continues to feel poorly, says she just does not have any appetite, nausea has improved but has not resolved she had some dry heaves earlier this morning.  Not eating much.  Continues to complain of abdominal pain across the mid and lower abdomen   Objective   Vital signs in last 24 hours: Temp:  [98.1 F (36.7 C)-99.3 F (37.4 C)] 98.6 F (37 C) (08/12 0527) Pulse Rate:  [69-76] 76 (08/12 0621) Resp:  [16-18] 16 (08/12 0621) BP: (96-124)/(36-60) 109/43 (08/12 0621) SpO2:  [92 %-100 %] 94 % (08/12 0726) Weight:  [76.7 kg] 76.7 kg (08/11 2104) Last BM Date: 03/14/21 General:   Elderly white female in NAD Heart:  Regular rate and rhythm; no murmurs Lungs: Respirations even and unlabored, lungs CTA bilaterally Abdomen:  Soft,  obese there is some mild tenderness across the lower abdomen no guarding or rebound and nondistended. Normal bowel sounds. Extremities:  Without edema. Neurologic:  Alert and oriented,  grossly normal neurologically. Psych:  Cooperative. Normal mood and affect.  Intake/Output from previous day: 08/11 0701 - 08/12 0700 In: 1328 [P.O.:940; Blood:388] Out: 0  Intake/Output this shift: Total I/O In: 240 [P.O.:240] Out: -   Lab Results: Recent Labs    03/14/21 0418 03/14/21 1247 03/15/21 0445 03/15/21 1806 03/15/21 2005 03/16/21 0618  WBC 19.3*  --  14.5*  --   --  10.6*  HGB 7.4*   < > 7.1* 8.5* 9.1* 8.5*  HCT 23.2*   < > 21.8* 26.6* 27.5* 26.9*  PLT 316  --  301  --   --  295   < > = values in this interval not displayed.   BMET Recent Labs    03/14/21 0902 03/15/21 0445 03/16/21 0618  NA 139 136 136  K 5.6* 3.8 3.9  CL 97* 98 97*  CO2 29 29 28   GLUCOSE 115* 72 75  BUN 63* 20 30*  CREATININE 9.46* 4.52* 5.78*  CALCIUM 8.7* 7.7* 8.4*   LFT Recent Labs    03/13/21 1106 03/14/21 0902  PROT 6.0*  --   ALBUMIN 2.0* 1.6*  AST 10*  --   ALT 9  --   ALKPHOS 92  --   BILITOT 0.5  --    PT/INR No results for input(s): LABPROT, INR  in the last 72 hours.  Studies/Results: CT HEAD WO CONTRAST (5MM)  Result Date: 03/14/2021 CLINICAL DATA:  Nausea and vomiting EXAM: CT HEAD WITHOUT CONTRAST TECHNIQUE: Contiguous axial images were obtained from the base of the skull through the vertex without intravenous contrast. COMPARISON:  CT head 04/24/2020 FINDINGS: Brain: Mild cerebral atrophy. Mild white matter hypodensity bilaterally which appears chronic and unchanged. Negative for acute infarct, hemorrhage, hydrocephalus Vascular: Negative for hyperdense vessel Skull: Negative Sinuses/Orbits: Paranasal sinuses clear.  No orbital mass Other: None IMPRESSION: No acute abnormality no change from prior CT. Mild atrophy and mild chronic microvascular ischemic change in the white  matter. Electronically Signed   By: Franchot Gallo M.D.   On: 03/14/2021 18:03   DG CHEST PORT 1 VIEW  Result Date: 03/15/2021 CLINICAL DATA:  End-stage renal disease EXAM: PORTABLE CHEST 1 VIEW COMPARISON:  02/25/2021 FINDINGS: Left-sided central venous catheter tip over the SVC. Mild cardiomegaly with vascular congestion. No pleural effusion or pneumothorax. Moderate to large hiatal hernia IMPRESSION: 1. Mild cardiomegaly with vascular congestion 2. Hiatal hernia Electronically Signed   By: Donavan Foil M.D.   On: 03/15/2021 19:31   ECHOCARDIOGRAM COMPLETE  Result Date: 03/15/2021    ECHOCARDIOGRAM REPORT   Patient Name:   [name] Date of Exam: 03/15/2021 Medical Rec #:  [id number]          Height:       56.0 in Accession #:    [id number]         Weight:       169.1 lb Date of Birth:  03-Jan-1946          BSA:          1.653 m Patient Age:    40 years           BP:           118/47 mmHg Patient Gender: F                  HR:           76 bpm. Exam Location:  Inpatient Procedure: Intracardiac Opacification Agent, Cardiac Doppler, Color Doppler and            2D Echo Indications:    Dyspnea R06.00  History:        Patient has prior history of Echocardiogram examinations, most                 recent 07/27/2019. Arrythmias:Atrial Fibrillation; Risk                 Factors:Hypertension, Dyslipidemia and Diabetes. GERD. End stage                 renal disease. Chronic respiratory failure/chronic obstructive                 pulmonary disease.  Sonographer:    Darlina Sicilian RDCS Referring Phys: [id number] [facility]  Sonographer Comments: No parasternal window, suboptimal apical window and suboptimal subcostal window. Hemodialysis Catheter Left Subclavian limits parasternal imaging. IMPRESSIONS  1. Technically difficult study - essentially only Definity contrast images were interpretable  2. Left ventricular ejection fraction, by estimation, is 65 to 70%. The left ventricle has normal function. The left  ventricle has no regional wall motion abnormalities. Left ventricular diastolic parameters are consistent with Grade I diastolic dysfunction (impaired relaxation). Elevated left ventricular end-diastolic pressure.  3. Right ventricular systolic function was not well visualized. The right ventricular size is not  well visualized.  4. The mitral valve was not well visualized. No evidence of mitral valve regurgitation.  5. The aortic valve was not well visualized. Aortic valve regurgitation is not visualized. Comparison(s): Changes from prior study are noted. 07/27/2019: LVEF 55-60%. FINDINGS  Left Ventricle: Left ventricular ejection fraction, by estimation, is 65 to 70%. The left ventricle has normal function. The left ventricle has no regional wall motion abnormalities. Definity contrast agent was given IV to delineate the left ventricular  endocardial borders. The left ventricular internal cavity size was normal in size. There is no left ventricular hypertrophy. Left ventricular diastolic function could not be evaluated due to atrial fibrillation. Left ventricular diastolic parameters are  consistent with Grade I diastolic dysfunction (impaired relaxation). Elevated left ventricular end-diastolic pressure. Right Ventricle: The right ventricular size is not well visualized. Right vetricular wall thickness was not well visualized. Right ventricular systolic function was not well visualized. Left Atrium: Left atrial size was normal in size. Right Atrium: Right atrial size was normal in size. Pericardium: The pericardium was not well visualized. Mitral Valve: The mitral valve was not well visualized. No evidence of mitral valve regurgitation. Tricuspid Valve: The tricuspid valve is grossly normal. Tricuspid valve regurgitation is trivial. Aortic Valve: The aortic valve was not well visualized. Aortic valve regurgitation is not visualized. Pulmonic Valve: The pulmonic valve was not well visualized. Pulmonic valve  regurgitation is not visualized. Aorta: The aortic root was not well visualized. IAS/Shunts: No atrial level shunt detected by color flow Doppler.   Diastology LV e' medial:    4.50 cm/s LV E/e' medial:  26.7 LV e' lateral:   6.75 cm/s LV E/e' lateral: 17.8  LEFT ATRIUM           Index LA Vol (A4C): 49.5 ml 29.95 ml/m  AORTIC VALVE LVOT Vmax:   101.00 cm/s LVOT Vmean:  61.700 cm/s LVOT VTI:    0.193 m MITRAL VALVE                TRICUSPID VALVE MV Area (PHT): 3.36 cm     TR Peak grad:   42.5 mmHg MV Decel Time: 226 msec     TR Vmax:        326.00 cm/s MV E velocity: 120.00 cm/s MV A velocity: 98.50 cm/s   SHUNTS MV E/A ratio:  1.22         Systemic VTI: 0.19 m Lyman Bishop MD Electronically signed by Lyman Bishop MD Signature Date/Time: 03/15/2021/3:10:25 PM    Final        Assessment / Plan:    #26 75 year old white female with end-stage renal disease on dialysis, insulin-dependent diabetes with recent persistent/recurrent nausea vomiting. Also continuing to complain of mid and lower abdominal pain.  Symptoms likely multifactoral Amiodarone can contribute to nausea and vomiting Possible gastroparesis Patient also has a very large hiatal hernia with mostly intrathoracic stomach and recent moderate gastritis documented on EGD a few weeks ago.  Rule out other intra-abdominal inflammatory process/neoplasm  #2 low-grade GI bleeding-resolved and has previously documented pan diverticulosis on colonoscopy about 1 year ago Off Eliquis since admission  #3 chronic anemia secondary to chronic kidney disease, acute component secondary to hematochezia which has resolved Hemoglobin stable  #4 atrial fibrillation-on chronic Eliquis 5.  COPD 6.  Coronary artery disease  Plan; continued scheduled dose antiemetics Continue daily PPI IV Surgery has evaluated, no plans for any surgical procedure this admission.  Per notes would need gastric emptying scan done and then  to be scheduled to see one of the  foregut surgeons as an outpatient. Patient is a poor operative candidate Will proceed with CT of the abdomen pelvis today    Principal Problem:   GI bleed Active Problems:   DM type 2 with diabetic peripheral neuropathy (HCC)   Benign essential HTN   Hematochezia   OSA (obstructive sleep apnea)   Hiatal hernia with GERD and esophagitis   Intractable nausea and vomiting   Chronic obstructive pulmonary disease (HCC)   Chronic diastolic CHF (congestive heart failure) (HCC)   Hyperkalemia   ESRD (end stage renal disease) on dialysis (HCC)   PAF (paroxysmal atrial fibrillation) (HCC)   Anemia in chronic kidney disease   Non-intractable vomiting     LOS: 2 days   Amy Esterwood PA-C 03/16/2021, 9:53 AM

## 2021-03-16 NOTE — Progress Notes (Signed)
Patient's chart reviewed and discussed with Dr. Donne Hazel.  Agree with GI note.  Would recommend outpatient GES to rule out gastroparesis, etc.  If negative and not felt to be source of her nausea, then we would recommend that she follow up with one of our foregut surgeon's to discuss hiatal hernia repair and her tolerance/ability to have such a surgery.  We would not plan to acutely pursue any surgical intervention currently while here in the hospital.  I will provide our contact information for the patient for follow up.  [name] 9:11 AM 03/16/2021

## 2021-03-16 NOTE — Care Management Important Message (Signed)
Important Message  Patient Details  Name: [name] MRN: [id number] Date of Birth: 1946-07-05   Medicare Important Message Given:  Yes     Orbie Pyo 03/16/2021, 4:57 PM

## 2021-03-16 NOTE — Progress Notes (Signed)
[facility] Progress Note  Subjective: no c/o today  Vitals:   03/15/21 2104 03/16/21 0527 03/16/21 0621 03/16/21 0726  BP: 124/60 (!) 124/36 (!) 109/43   Pulse: 74 73 76   Resp: 16 18 16    Temp: 98.4 F (36.9 C) 98.6 F (37 C)    TempSrc:  Oral    SpO2: 98% 95% 95% 94%  Weight: 76.7 kg     Height:        Exam:  alert, nad   no jvd  Chest cta bilat  Cor reg no RG  Abd soft ntnd no ascites   Ext no LE edema, 1-2+ focal LUE edema   Alert, NF, ox3   RUA AVF maturing/ TDC    OP HD: [facility] TTS   4h  400/800   73kg  2/2.5 bath  Hep none  TDC/ AVF RUE maturing   -Venofer 100 mg IV X 10 doses (4/10 dose given)   -Mircera 100 mcg q 2 weeks (last 02/15/2021)   -Calcitriol 1.25 mcg PO TIW       Assessment/ Plan: N/V/ abd pain - per GI poss gastroparesis, ESRD, large hiatal hernia, multifactorial. Getting zofran for now. Per GI/ primary team GI bleed - per GI/ primary team Anemia - of CKD and/or GI blood loss. Is on eliquis, getting 1u prbc today for Hb 7.1.  Last OP esa 3 wks ago, will redose here w/ darbe 150 ug SQ today.  ESRD - on HD TTS.  On HD for about 2 years. Due to staffing issues we are doing HD just 2 times per week when possible. Next HD Saturday.  HD access - sp RUA AVF 2nd stage surgery in June, using [facility], pt to f/u w/ VVS 8/29 BP/ volume/ hypotension - no vol excess on exam. Having problems w/ low BP's at OP HD. Significant BP drops here on HD, is on midodrine 10 tid here as at home. May need to raise dry wt. Keep even w/ HD tomorrow. CXR clear, echo LVEF wnl, RV not seen. Last echo showed severe pHTN 68 mm Hg.  Cortisol wnl, TSH pend.  COPD/ OSA AFib  Gout DM2    Rob Tyneka Scafidi 03/16/2021, 12:03 PM   Recent Labs  Lab 03/14/21 0902 03/14/21 1247 03/15/21 0445 03/15/21 1806 03/15/21 2005 03/16/21 0618  K 5.6*  --  3.8  --   --  3.9  BUN 63*  --  20  --   --  30*  CREATININE 9.46*  --  4.52*  --   --  5.78*  CALCIUM 8.7*  --  7.7*  --   --   8.4*  PHOS 4.2  --   --   --   --   --   HGB  --    < > 7.1*   < > 9.1* 8.5*   < > = values in this interval not displayed.   Inpatient medications:  sodium chloride   Intravenous Once   allopurinol  100 mg Oral QHS   amiodarone  200 mg Oral q AM   atorvastatin  40 mg Oral Q1500   calcitRIOL  1.25 mcg Oral Q M,W,F-HD   Chlorhexidine Gluconate Cloth  6 each Topical Q0600   darbepoetin (ARANESP) injection - NON-DIALYSIS  150 mcg Subcutaneous Q Thu-1800   famotidine  20 mg Oral QPM   feeding supplement (NEPRO CARB STEADY)  237 mL Oral BID BM   ferric citrate  210 mg Oral TID WC  fluticasone furoate-vilanterol  1 puff Inhalation Daily   gabapentin  100 mg Oral QHS   insulin aspart  0-6 Units Subcutaneous TID WC   midodrine  10 mg Oral TID WC   ondansetron  4 mg Oral Q8H   pantoprazole (PROTONIX) IV  40 mg Intravenous Daily   sodium chloride flush  3 mL Intravenous Q12H   umeclidinium bromide  1 puff Inhalation Daily    sodium chloride     promethazine (PHENERGAN) injection (IM or IVPB) 12.5 mg (03/14/21 1726)   sodium chloride, acetaminophen, albuterol, nitroGLYCERIN, promethazine (PHENERGAN) injection (IM or IVPB), sodium chloride flush

## 2021-03-17 ENCOUNTER — Inpatient Hospital Stay (HOSPITAL_COMMUNITY): Payer: Medicare Other

## 2021-03-17 DIAGNOSIS — K449 Diaphragmatic hernia without obstruction or gangrene: Secondary | ICD-10-CM | POA: Diagnosis not present

## 2021-03-17 DIAGNOSIS — K922 Gastrointestinal hemorrhage, unspecified: Secondary | ICD-10-CM | POA: Diagnosis not present

## 2021-03-17 DIAGNOSIS — D631 Anemia in chronic kidney disease: Secondary | ICD-10-CM | POA: Diagnosis not present

## 2021-03-17 DIAGNOSIS — R112 Nausea with vomiting, unspecified: Secondary | ICD-10-CM | POA: Diagnosis not present

## 2021-03-17 DIAGNOSIS — K5792 Diverticulitis of intestine, part unspecified, without perforation or abscess without bleeding: Secondary | ICD-10-CM

## 2021-03-17 DIAGNOSIS — N186 End stage renal disease: Secondary | ICD-10-CM | POA: Diagnosis not present

## 2021-03-17 LAB — CBC WITH DIFFERENTIAL/PLATELET
Abs Immature Granulocytes: 0.61 10*3/uL — ABNORMAL HIGH (ref 0.00–0.07)
Basophils Absolute: 0.1 10*3/uL (ref 0.0–0.1)
Basophils Relative: 0 %
Eosinophils Absolute: 0.2 10*3/uL (ref 0.0–0.5)
Eosinophils Relative: 2 %
HCT: 27 % — ABNORMAL LOW (ref 36.0–46.0)
Hemoglobin: 8.7 g/dL — ABNORMAL LOW (ref 12.0–15.0)
Immature Granulocytes: 5 %
Lymphocytes Relative: 8 %
Lymphs Abs: 0.9 10*3/uL (ref 0.7–4.0)
MCH: 29.2 pg (ref 26.0–34.0)
MCHC: 32.2 g/dL (ref 30.0–36.0)
MCV: 90.6 fL (ref 80.0–100.0)
Monocytes Absolute: 0.8 10*3/uL (ref 0.1–1.0)
Monocytes Relative: 7 %
Neutro Abs: 9.2 10*3/uL — ABNORMAL HIGH (ref 1.7–7.7)
Neutrophils Relative %: 78 %
Platelets: 318 10*3/uL (ref 150–400)
RBC: 2.98 MIL/uL — ABNORMAL LOW (ref 3.87–5.11)
RDW: 15.5 % (ref 11.5–15.5)
WBC: 11.9 10*3/uL — ABNORMAL HIGH (ref 4.0–10.5)
nRBC: 0 % (ref 0.0–0.2)

## 2021-03-17 LAB — RENAL FUNCTION PANEL
Albumin: 1.7 g/dL — ABNORMAL LOW (ref 3.5–5.0)
Anion gap: 10 (ref 5–15)
BUN: 38 mg/dL — ABNORMAL HIGH (ref 8–23)
CO2: 30 mmol/L (ref 22–32)
Calcium: 8.3 mg/dL — ABNORMAL LOW (ref 8.9–10.3)
Chloride: 94 mmol/L — ABNORMAL LOW (ref 98–111)
Creatinine, Ser: 6.96 mg/dL — ABNORMAL HIGH (ref 0.44–1.00)
GFR, Estimated: 6 mL/min — ABNORMAL LOW (ref >60)
Glucose, Bld: 119 mg/dL — ABNORMAL HIGH (ref 70–99)
Phosphorus: 4.1 mg/dL (ref 2.5–4.6)
Potassium: 4 mmol/L (ref 3.5–5.1)
Sodium: 134 mmol/L — ABNORMAL LOW (ref 135–145)

## 2021-03-17 LAB — GLUCOSE, CAPILLARY
Glucose-Capillary: 75 mg/dL (ref 70–99)
Glucose-Capillary: 74 mg/dL (ref 70–99)
Glucose-Capillary: 97 mg/dL (ref 70–99)
Glucose-Capillary: 105 mg/dL — ABNORMAL HIGH (ref 70–99)

## 2021-03-17 LAB — HEMOGLOBIN AND HEMATOCRIT, BLOOD
HCT: 27.1 % — ABNORMAL LOW (ref 36.0–46.0)
Hemoglobin: 8.8 g/dL — ABNORMAL LOW (ref 12.0–15.0)

## 2021-03-17 MED ORDER — AMOXICILLIN-POT CLAVULANATE 875-125 MG PO TABS: 1.0000 | ORAL_TABLET | Freq: Two times a day (BID) | ORAL | Status: DC

## 2021-03-17 MED ORDER — IOHEXOL 350 MG/ML SOLN
100.0000 mL | Freq: Once | INTRAVENOUS | Status: AC | PRN
  Administered 2021-03-17: 100 mL via INTRAVENOUS

## 2021-03-17 MED ORDER — MIDODRINE HCL 5 MG PO TABS
ORAL_TABLET | ORAL | Status: AC
  Administered 2021-03-17: 10 mg via ORAL
  Filled 2021-03-17: qty 2

## 2021-03-17 MED ORDER — HEPARIN SODIUM (PORCINE) 1000 UNIT/ML IJ SOLN
INTRAMUSCULAR | Status: AC
  Administered 2021-03-17: 1000 [IU]
  Filled 2021-03-17: qty 4

## 2021-03-17 MED ORDER — AMOXICILLIN-POT CLAVULANATE 500-125 MG PO TABS
1.0000 | ORAL_TABLET | ORAL | Status: DC
  Administered 2021-03-17: 500 mg via ORAL
  Filled 2021-03-17 (×2): qty 1

## 2021-03-17 NOTE — Progress Notes (Signed)
PHARMACY NOTE:  ANTIMICROBIAL RENAL DOSAGE ADJUSTMENT  Current antimicrobial regimen includes a mismatch between antimicrobial dosage and estimated renal function.  As per policy approved by the Pharmacy & Therapeutics and Medical Executive Committees, the antimicrobial dosage will be adjusted accordingly.  Current antimicrobial dosage:  Augmentin 875/125 mg PO Q 12 hrs  Indication:  Possible diverticulitis  Renal Function:  Estimated Creatinine Clearance: 5.8 mL/min (A) (by C-G formula based on SCr of 6.96 mg/dL (H)). [x]      On intermittent HD []      On CRRT    Antimicrobial dosage has been changed to:  Augmentin 500/125 mg PO Q 24 hrs (give dose daily; on HD days, pt to receive dose on the nursing unit after she returns from HD session)  Thank you for allowing pharmacy to be a part of this patient's care.  Gillermina Hu, PharmD, BCPS, [facility] Clinical Pharmacist 03/17/2021 5:33 PM

## 2021-03-17 NOTE — Progress Notes (Signed)
Patient ID: [name], female   DOB: [birth year], 75 y.o.   MRN: [id number]  PROGRESS NOTE    SHANEQUA WHITENIGHT  LZJ:[id number] DOB: 1945/10/28 DOA: 03/13/2021 PCP: [name], MD   Brief Narrative:  75 y.o. female with medical history significant of ESRD on HD, HTN, HLD, PAF on Eliquis, GERD with esophagitis, OSA on BiPAP, colon polyps, and prior history of COVID-19 pneumonia where hospital course was complicated by upper GI bleed, recent hospitalization from 02/25/2021-02/28/2021 for hematemesis/nausea and vomiting status post EGD on 02/26/2021 which had shown gastritis with superficial erosions and large hiatal hernia at risk for volvulus as per GI presented with nausea, vomiting and bloody bowel movements.  On presentation, hemoglobin was 9.4, WBCs of 15.6, potassium 6.6 with grossly positive blood in stool on exam as per ED provider.  Nephrology and GI were consulted.  She was given Lokelma, NovoLog and dextrose 50% for hyperkalemia.  No EKG changes of hyperkalemia.  Assessment & Plan:   GI bleeding -Patient had gross blood on rectal exam on presentation.  Had recent admission for GI bleeding and EGD on 02/26/2021 had shown gastritis with superficial erosions and large hiatal hernia at risk for volvulus as per GI -Hemoglobin 9.4 on presentation.  Hemoglobin 9.3 last night.  Status post 1 unit packed red cell transfusion on 03/15/2021.  Eliquis on hold. -Monitor H&H.  Continue IV Protonix -GI following.  Follow further recommendations.  Intractable nausea and vomiting -Improving.  Currently on around-the-clock Zofran.  Diet advancement as per GI.  Hyperkalemia -Presented with potassium of 6.6.  Treated with Lokelma, NovoLog and dextrose 50% in the ED; no EKG changes on presentation. -Resolved.    End-stage renal disease on hemodialysis -Nephrology following.  Dialysis as per nephrology schedule  Hiatal hernia with GERD and esophagitis -As per recent EGD.  At risk for gastric  volvulus.  Abdominal x-ray showed no evidence of volvulus.  Continue IV Protonix.  Follow GI recommendations.   -General surgery recommending outpatient follow-up  Leukocytosis -Possibly reactive. Monitor  Paroxysmal A. fib -Rate controlled.  Continue amiodarone.  Eliquis on hold.  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  A1c 5.1 in March 2022.  Continue CBGs with SSI. -Patient has had episodes of hypoglycemia due to poor oral intake.  Monitor.  Hypotension -Blood pressure on the lower side.  Midodrine has been resumed.  OSA-continue BiPAP at night  Chronic respiratory failure/chronic obstructive pulmonary disease -Uses 2 L oxygen via nasal cannula as needed.  Currently respiratory status is stable.  Continue home regimen.  Hyperlipidemia -Continue Lipitor  Resting tremor -Outpatient follow-up with neurology  Obesity -Outpatient follow-up  Generalized deconditioning -PT recommends home health PT  DVT prophylaxis: SCDs Code Status: Full Family Communication: None at bedside  disposition Plan: Status is: Inpatient because: Inpatient level of care appropriate due to severity of illness  Dispo: The patient is from: Home              Anticipated d/c is to: Home              Patient currently is not medically stable to d/c.   Difficult to place patient No  Consultants: Nephrology/GI  Procedures: Echo  Antimicrobials: None   Subjective: Patient seen and examined at bedside undergoing dialysis.  Sleepy, wakes up slightly.  Denies worsening abdominal pain.  No overnight fever or vomiting reported.  Still complains of intermittent nausea.  Objective: Vitals:   03/16/21 0726  03/16/21 1851 03/16/21 2055 03/17/21 0455  BP:  (!) 122/45 (!) 117/54 (!) 116/51  Pulse:  72 71 75  Resp:  16 17 19   Temp:  98.4 F (36.9 C) 98.1 F (36.7 C) 98.1 F (36.7 C)  TempSrc:  Oral Oral Oral  SpO2: 94% 97% 99%   Weight:     78.8 kg  Height:        Intake/Output Summary (Last 24 hours) at 03/17/2021 0729 Last data filed at 03/16/2021 1900 Gross per 24 hour  Intake 720 ml  Output --  Net 720 ml    Filed Weights   03/15/21 0500 03/15/21 2104 03/17/21 0455  Weight: 76.7 kg 76.7 kg 78.8 kg    Examination:  General exam: Not in acute distress.  Still on 2 L oxygen via nasal cannula.  Looks chronically ill and deconditioned. Respiratory system: Decreased breath sounds at bases bilaterally with some crackles  cardiovascular system: Rate controlled, S1-S2 heard gastrointestinal system: Abdomen is obese, mildly distended, soft and nontender.  Bowel sounds are heard  extremities: Mild lower extremity edema present; no clubbing  central nervous system: Sleepy, wakes up slightly, still slow to respond.  Poor historian.  No focal neurological deficits.  Moves extremities skin: No obvious petechiae/rashes  psychiatry: Affect is flat   Data Reviewed: I have personally reviewed following labs and imaging studies  CBC: Recent Labs  Lab 03/13/21 1106 03/13/21 1511 03/14/21 0044 03/14/21 0418 03/14/21 1247 03/15/21 0445 03/15/21 1806 03/15/21 2005 03/16/21 0618 03/16/21 1504 03/16/21 2324  WBC 15.6*   < > 20.0* 19.3*  --  14.5*  --   --  10.6*  --  14.4*  NEUTROABS 12.4*  --   --   --   --  12.1*  --   --  7.8*  --  10.8*  HGB 9.4*   < > 7.4* 7.4*   < > 7.1* 8.5* 9.1* 8.5* 9.4* 9.3*  HCT 29.0*   < > 23.4* 23.2*   < > 21.8* 26.6* 27.5* 26.9* 27.7* 28.5*  MCV 91.8   < > 91.4 90.6  --  90.8  --   --  91.5  --  88.5  PLT 390   < > 379 316  --  301  --   --  295  --  364   < > = values in this interval not displayed.    Basic Metabolic Panel: Recent Labs  Lab 03/13/21 2022 03/14/21 0902 03/15/21 0445 03/16/21 0618 03/16/21 2324  NA 141 139 136 136 135  K 5.8* 5.6* 3.8 3.9 4.1  CL 98 97* 98 97* 94*  CO2 26 29 29 28 27   GLUCOSE 135* 115* 72 75 111*  BUN 62* 63* 20 30* 36*  CREATININE 8.78* 9.46*  4.52* 5.78* 6.89*  CALCIUM 9.2 8.7* 7.7* 8.4* 8.6*  MG  --   --   --   --  2.0  PHOS  --  4.2  --   --   --     GFR: Estimated Creatinine Clearance: 5.8 mL/min (A) (by C-G formula based on SCr of 6.89 mg/dL (H)). Liver Function Tests: Recent Labs  Lab 03/13/21 1106 03/14/21 0902  AST 10*  --   ALT 9  --   ALKPHOS 92  --   BILITOT 0.5  --   PROT 6.0*  --   ALBUMIN 2.0* 1.6*    Recent Labs  Lab 03/13/21 1106  LIPASE 26    No results for  input(s): AMMONIA in the last 168 hours. Coagulation Profile: No results for input(s): INR, PROTIME in the last 168 hours. Cardiac Enzymes: No results for input(s): CKTOTAL, CKMB, CKMBINDEX, TROPONINI in the last 168 hours. BNP (last 3 results) No results for input(s): PROBNP in the last 8760 hours. HbA1C: No results for input(s): HGBA1C in the last 72 hours. CBG: Recent Labs  Lab 03/15/21 2105 03/16/21 0640 03/16/21 1151 03/16/21 1712 03/17/21 0632  GLUCAP 82 71 117* 98 75    Lipid Profile: No results for input(s): CHOL, HDL, LDLCALC, TRIG, CHOLHDL, LDLDIRECT in the last 72 hours.  Thyroid Function Tests: Recent Labs    03/16/21 0618  TSH 0.691   Anemia Panel: No results for input(s): VITAMINB12, FOLATE, FERRITIN, TIBC, IRON, RETICCTPCT in the last 72 hours. Sepsis Labs: No results for input(s): PROCALCITON, LATICACIDVEN in the last 168 hours.  Recent Results (from the past 240 hour(s))  Resp Panel by RT-PCR (Flu A&B, Covid) Nasopharyngeal Swab     Status: None   Collection Time: 03/13/21  2:10 PM   Specimen: Nasopharyngeal Swab; Nasopharyngeal(NP) swabs in vial transport medium  Result Value Ref Range Status   SARS Coronavirus 2 by RT PCR NEGATIVE NEGATIVE Final    Comment: (NOTE) SARS-CoV-2 target nucleic acids are NOT DETECTED.  The SARS-CoV-2 RNA is generally detectable in upper respiratory specimens during the acute phase of infection. The lowest concentration of SARS-CoV-2 viral copies this assay can detect  is 138 copies/mL. A negative result does not preclude SARS-Cov-2 infection and should not be used as the sole basis for treatment or other patient management decisions. A negative result may occur with  improper specimen collection/handling, submission of specimen other than nasopharyngeal swab, presence of viral mutation(s) within the areas targeted by this assay, and inadequate number of viral copies(<138 copies/mL). A negative result must be combined with clinical observations, patient history, and epidemiological information. The expected result is Negative.  Fact Sheet for Patients:  EntrepreneurPulse.com.au  Fact Sheet for Healthcare Providers:  IncredibleEmployment.be  This test is no t yet approved or cleared by the [place] FDA and  has been authorized for detection and/or diagnosis of SARS-CoV-2 by FDA under an Emergency Use Authorization (EUA). This EUA will remain  in effect (meaning this test can be used) for the duration of the COVID-19 declaration under Section 564(b)(1) of the Act, 21 U.S.C.section 360bbb-3(b)(1), unless the authorization is terminated  or revoked sooner.       Influenza A by PCR NEGATIVE NEGATIVE Final   Influenza B by PCR NEGATIVE NEGATIVE Final    Comment: (NOTE) The Xpert Xpress SARS-CoV-2/FLU/RSV plus assay is intended as an aid in the diagnosis of influenza from Nasopharyngeal swab specimens and should not be used as a sole basis for treatment. Nasal washings and aspirates are unacceptable for Xpert Xpress SARS-CoV-2/FLU/RSV testing.  Fact Sheet for Patients: EntrepreneurPulse.com.au  Fact Sheet for Healthcare Providers: IncredibleEmployment.be  This test is not yet approved or cleared by the [place] FDA and has been authorized for detection and/or diagnosis of SARS-CoV-2 by FDA under an Emergency Use Authorization (EUA). This EUA will remain in effect  (meaning this test can be used) for the duration of the COVID-19 declaration under Section 564(b)(1) of the Act, 21 U.S.C. section 360bbb-3(b)(1), unless the authorization is terminated or revoked.  Performed at [facility], [place] [address] [place], [place] [address]           Radiology Studies: DG CHEST PORT 1 VIEW  Result Date: 03/15/2021 CLINICAL DATA:  End-stage renal disease EXAM: PORTABLE CHEST 1 VIEW COMPARISON:  02/25/2021 FINDINGS: Left-sided central venous catheter tip over the SVC. Mild cardiomegaly with vascular congestion. No pleural effusion or pneumothorax. Moderate to large hiatal hernia IMPRESSION: 1. Mild cardiomegaly with vascular congestion 2. Hiatal hernia Electronically Signed   By: Donavan Foil M.D.   On: 03/15/2021 19:31   ECHOCARDIOGRAM COMPLETE  Result Date: 03/15/2021    ECHOCARDIOGRAM REPORT   Patient Name:   MIAROSE LIPPERT Date of Exam: 03/15/2021 Medical Rec #:  [id number]          Height:       56.0 in Accession #:    [id number]         Weight:       169.1 lb Date of Birth:  1945-11-06          BSA:          1.653 m Patient Age:    6 years           BP:           118/47 mmHg Patient Gender: F                  HR:           76 bpm. Exam Location:  Inpatient Procedure: Intracardiac Opacification Agent, Cardiac Doppler, Color Doppler and            2D Echo Indications:    Dyspnea R06.00  History:        Patient has prior history of Echocardiogram examinations, most                 recent 07/27/2019. Arrythmias:Atrial Fibrillation; Risk                 Factors:Hypertension, Dyslipidemia and Diabetes. GERD. End stage                 renal disease. Chronic respiratory failure/chronic obstructive                 pulmonary disease.  Sonographer:    Darlina Sicilian RDCS Referring Phys: [id number] [facility]  Sonographer Comments: No parasternal window, suboptimal apical window and suboptimal subcostal window. Hemodialysis Catheter Left Subclavian limits  parasternal imaging. IMPRESSIONS  1. Technically difficult study - essentially only Definity contrast images were interpretable  2. Left ventricular ejection fraction, by estimation, is 65 to 70%. The left ventricle has normal function. The left ventricle has no regional wall motion abnormalities. Left ventricular diastolic parameters are consistent with Grade I diastolic dysfunction (impaired relaxation). Elevated left ventricular end-diastolic pressure.  3. Right ventricular systolic function was not well visualized. The right ventricular size is not well visualized.  4. The mitral valve was not well visualized. No evidence of mitral valve regurgitation.  5. The aortic valve was not well visualized. Aortic valve regurgitation is not visualized. Comparison(s): Changes from prior study are noted. 07/27/2019: LVEF 55-60%. FINDINGS  Left Ventricle: Left ventricular ejection fraction, by estimation, is 65 to 70%. The left ventricle has normal function. The left ventricle has no regional wall motion abnormalities. Definity contrast agent was given IV to delineate the left ventricular  endocardial borders. The left ventricular internal cavity size was normal in size. There is no left ventricular hypertrophy. Left ventricular diastolic function could not be evaluated due to atrial fibrillation. Left ventricular diastolic parameters are  consistent with Grade I diastolic dysfunction (impaired relaxation). Elevated left ventricular end-diastolic pressure.  Right Ventricle: The right ventricular size is not well visualized. Right vetricular wall thickness was not well visualized. Right ventricular systolic function was not well visualized. Left Atrium: Left atrial size was normal in size. Right Atrium: Right atrial size was normal in size. Pericardium: The pericardium was not well visualized. Mitral Valve: The mitral valve was not well visualized. No evidence of mitral valve regurgitation. Tricuspid Valve: The tricuspid valve  is grossly normal. Tricuspid valve regurgitation is trivial. Aortic Valve: The aortic valve was not well visualized. Aortic valve regurgitation is not visualized. Pulmonic Valve: The pulmonic valve was not well visualized. Pulmonic valve regurgitation is not visualized. Aorta: The aortic root was not well visualized. IAS/Shunts: No atrial level shunt detected by color flow Doppler.   Diastology LV e' medial:    4.50 cm/s LV E/e' medial:  26.7 LV e' lateral:   6.75 cm/s LV E/e' lateral: 17.8  LEFT ATRIUM           Index LA Vol (A4C): 49.5 ml 29.95 ml/m  AORTIC VALVE LVOT Vmax:   101.00 cm/s LVOT Vmean:  61.700 cm/s LVOT VTI:    0.193 m MITRAL VALVE                TRICUSPID VALVE MV Area (PHT): 3.36 cm     TR Peak grad:   42.5 mmHg MV Decel Time: 226 msec     TR Vmax:        326.00 cm/s MV E velocity: 120.00 cm/s MV A velocity: 98.50 cm/s   SHUNTS MV E/A ratio:  1.22         Systemic VTI: 0.19 m Lyman Bishop MD Electronically signed by Lyman Bishop MD Signature Date/Time: 03/15/2021/3:10:25 PM    Final         Scheduled Meds:  sodium chloride   Intravenous Once   allopurinol  100 mg Oral QHS   amiodarone  200 mg Oral q AM   atorvastatin  40 mg Oral Q1500   calcitRIOL  1.25 mcg Oral Q M,W,F-HD   Chlorhexidine Gluconate Cloth  6 each Topical Q0600   darbepoetin (ARANESP) injection - NON-DIALYSIS  150 mcg Subcutaneous Q Thu-1800   famotidine  20 mg Oral QPM   feeding supplement (NEPRO CARB STEADY)  237 mL Oral BID BM   ferric citrate  210 mg Oral TID WC   fluticasone furoate-vilanterol  1 puff Inhalation Daily   gabapentin  100 mg Oral QHS   insulin aspart  0-6 Units Subcutaneous TID WC   iohexol       midodrine  10 mg Oral TID WC   ondansetron  4 mg Oral Q8H   pantoprazole (PROTONIX) IV  40 mg Intravenous Daily   sodium chloride flush  3 mL Intravenous Q12H   umeclidinium bromide  1 puff Inhalation Daily   Continuous Infusions:  sodium chloride     promethazine (PHENERGAN) injection (IM  or IVPB) 12.5 mg (03/14/21 1726)          Aline August, MD [facility] 03/17/2021, 7:30 AM

## 2021-03-17 NOTE — Progress Notes (Signed)
[facility] Progress Note  Subjective: no c/o today, seen on HD  Vitals:   03/17/21 1030 03/17/21 1050 03/17/21 1127 03/17/21 1650  BP: (!) 115/38 (!) 119/57 (!) 115/30 (!) 102/35  Pulse: 69 70 72 71  Resp:   14 18  Temp:  98.6 F (37 C) 98.3 F (36.8 C) 98.3 F (36.8 C)  TempSrc:  Oral Oral Oral  SpO2:  96% 90% 96%  Weight:  80 kg    Height:        Exam:  alert, nad   no jvd  Chest cta bilat  Cor reg no RG  Abd soft ntnd no ascites   Ext no LE edema, 1-2+ focal LUE edema   Alert, NF, ox3   RUA AVF maturing/ TDC    OP HD: [facility] TTS   4h  400/800   73kg  2/2.5 bath  Hep none  TDC/ AVF RUE maturing   -Venofer 100 mg IV X 10 doses (4/10 dose given)   -Mircera 100 mcg q 2 weeks (last 02/15/2021)   -Calcitriol 1.25 mcg PO TIW       Assessment/ Plan: N/V/ abd pain - per GI poss gastroparesis, ESRD, large hiatal hernia, multifactorial. Getting zofran for now. Per GI/ primary team GI bleed - per GI/ primary team BP/ volume/ hypotension - no vol excess on exam. Having problems w/ low BP's at OP HD. Had sig BP drop here w/ UF attempt. Is on midodrine 10 tid here as at home. CXR clear, echo LVEF wnl, RV not seen. Last echo showed severe pHTN 68 mm Hg.  Cortisol wnl, TSH wnl. Suspect pt has gained body wt and is dry and has RHF/ cor pulmonale. Letting wt's come up and is tolerating well w/o any resp issues or sig edema. Raise dry wt at d/c.  ESRD - on HD TTS.  On HD for about 2 years. Had just 2 HD sessions this week here due to staffing issues. HD today.  Anemia - of CKD and/or GI blood loss. Is on eliquis, getting 1u prbc today for Hb 7.1.  Last OP esa 3 wks ago, will redose here w/ darbe 150 ug SQ today.  HD access - sp RUA AVF 2nd stage surgery in June, using [facility], pt to f/u w/ [facility] 8/29 COPD/ OSA AFib  Gout DM2    Rob Jeselle Hiser 03/17/2021, 9:04 PM   Recent Labs  Lab 03/14/21 0902 03/14/21 1247 03/16/21 2324 03/17/21 0730 03/17/21 0751 03/17/21 1949  K  5.6*   < > 4.1  --  4.0  --   BUN 63*   < > 36*  --  38*  --   CREATININE 9.46*   < > 6.89*  --  6.96*  --   CALCIUM 8.7*   < > 8.6*  --  8.3*  --   PHOS 4.2  --   --   --  4.1  --   HGB  --    < > 9.3* 8.7*  --  8.8*   < > = values in this interval not displayed.    Inpatient medications:  sodium chloride   Intravenous Once   allopurinol  100 mg Oral QHS   amiodarone  200 mg Oral q AM   amoxicillin-clavulanate  1 tablet Oral Q24H   atorvastatin  40 mg Oral Q1500   calcitRIOL  1.25 mcg Oral Q M,W,F-HD   Chlorhexidine Gluconate Cloth  6 each Topical Q0600   darbepoetin (ARANESP) injection -  NON-DIALYSIS  150 mcg Subcutaneous Q Thu-1800   famotidine  20 mg Oral QPM   feeding supplement (NEPRO CARB STEADY)  237 mL Oral BID BM   ferric citrate  210 mg Oral TID WC   fluticasone furoate-vilanterol  1 puff Inhalation Daily   gabapentin  100 mg Oral QHS   insulin aspart  0-6 Units Subcutaneous TID WC   midodrine  10 mg Oral TID WC   ondansetron  4 mg Oral Q8H   pantoprazole (PROTONIX) IV  40 mg Intravenous Daily   sodium chloride flush  3 mL Intravenous Q12H   umeclidinium bromide  1 puff Inhalation Daily    sodium chloride     promethazine (PHENERGAN) injection (IM or IVPB) 12.5 mg (03/14/21 1726)   sodium chloride, acetaminophen, albuterol, nitroGLYCERIN, promethazine (PHENERGAN) injection (IM or IVPB), sodium chloride flush

## 2021-03-17 NOTE — Progress Notes (Signed)
Patient has home BIPAP for use and will self place when ready.  

## 2021-03-17 NOTE — Progress Notes (Addendum)
[facility] ROUNDING NOTE   Subjective: HD earlier today.  CT earlier today shows mild bilateral atelectasis with small bilateral pleural effusions, hiatal hernia, sick centimeter area of thickening in the sigmoid, potentially uncomplicated diverticulitis.  Otherwise no obstruction.  Met with the patient and her son today.  She states her nausea is much improved, chest not interested in the food that she is being offered.  Some mild lower abdominal pain, otherwise not endorsing any complaints.   Objective: Vital signs in last 24 hours: Temp:  [97.9 F (36.6 C)-98.6 F (37 C)] 98.3 F (36.8 C) (08/13 1650) Pulse Rate:  [63-86] 71 (08/13 1650) Resp:  [14-19] 18 (08/13 1650) BP: (80-122)/(27-64) 102/35 (08/13 1650) SpO2:  [90 %-99 %] 96 % (08/13 1650) Weight:  [78.8 kg-80 kg] 80 kg (08/13 1050) Last BM Date: 03/16/21 General: NAD Heart:  RRR, no m/r/g Abdomen: Mild TTP in lower abdomen.  No rebound or guarding.  No peritoneal signs.  Soft, ND, +BS    Intake/Output from previous day: 08/12 0701 - 08/13 0700 In: 720 [P.O.:720] Out: -  Intake/Output this shift: No intake/output data recorded.   Lab Results: Recent Labs    03/16/21 0618 03/16/21 1504 03/16/21 2324 03/17/21 0730  WBC 10.6*  --  14.4* 11.9*  HGB 8.5* 9.4* 9.3* 8.7*  PLT 295  --  364 318  MCV 91.5  --  88.5 90.6   BMET Recent Labs    03/16/21 0618 03/16/21 2324 03/17/21 0751  NA 136 135 134*  K 3.9 4.1 4.0  CL 97* 94* 94*  CO2 '28 27 30  ' GLUCOSE 75 111* 119*  BUN 30* 36* 38*  CREATININE 5.78* 6.89* 6.96*  CALCIUM 8.4* 8.6* 8.3*   LFT Recent Labs    03/17/21 0751  ALBUMIN 1.7*   PT/INR No results for input(s): INR in the last 72 hours.    Imaging/Other results: CT ABDOMEN PELVIS W CONTRAST  Result Date: 03/17/2021 CLINICAL DATA:  Abdominal pain. Persistent nausea and vomiting. End-stage renal disease on dialysis. EXAM: CT ABDOMEN AND PELVIS WITH CONTRAST TECHNIQUE:  Multidetector CT imaging of the abdomen and pelvis was performed using the standard protocol following bolus administration of intravenous contrast. CONTRAST:  150m OMNIPAQUE IOHEXOL 350 MG/ML SOLN COMPARISON:  CT 07/17/2019, 04/24/2020 FINDINGS: Lower chest: There is atelectasis at the RIGHT lung base related to the hiatal hernia. Mild bibasilar atelectasis on the LEFT and RIGHT is increased from comparison exam. There is small bilateral pleural effusions. Hepatobiliary: No focal hepatic lesion. Postcholecystectomy. No biliary dilatation. Pancreas: Pancreas is normal. No ductal dilatation. No pancreatic inflammation. Spleen: Normal spleen Adrenals/urinary tract: Adrenal glands normal. Bilateral renal cortical thinning. Bladder normal Stomach/Bowel: There is a mildly complex hilar hernia again demonstrated. This hernia appears relatively stable over multiple comparison CT exams. No high-grade obstruction as contrast passes through the entirety of the small bowel to the colon. Small bowel is normal. Terminal ileum is normal. Appendix is normal. There multiple diverticula the ascending colon without acute inflammation. Multiple diverticula of the descending colon and sigmoid colon. There is some thickening in the proximal sigmoid colon over a 6 cm segment (66/5. Rectum normal Vascular/Lymphatic: Abdominal aorta is normal caliber with atherosclerotic calcification. There is no retroperitoneal or periportal lymphadenopathy. No pelvic lymphadenopathy. Reproductive: Post hysterectomy.  Adnexa unremarkable Other: No free fluid. Musculoskeletal: No aggressive osseous lesion. IMPRESSION: 1. Thickening through the sigmoid colon could represent mild diverticulitis. Extensive diverticulosis involving the LEFT and RIGHT colon. 2. Large hiatal hernia not changed  over multiple comparison exams. The entire hernia is not imaged. 3. Basilar atelectasis related to hilar hernia. Small bilateral pleural effusions. Electronically Signed    By: Suzy Bouchard M.D.   On: 03/17/2021 08:08   DG CHEST PORT 1 VIEW  Result Date: 03/15/2021 CLINICAL DATA:  End-stage renal disease EXAM: PORTABLE CHEST 1 VIEW COMPARISON:  02/25/2021 FINDINGS: Left-sided central venous catheter tip over the SVC. Mild cardiomegaly with vascular congestion. No pleural effusion or pneumothorax. Moderate to large hiatal hernia IMPRESSION: 1. Mild cardiomegaly with vascular congestion 2. Hiatal hernia Electronically Signed   By: Donavan Foil M.D.   On: 03/15/2021 19:31      Assessment and Plan:  1) Nausea - Nausea much improved on scheduled Zofran.  Issue now seems to be more not interested in the food - Continue Zofran scheduled for now, and can hopefully change to prn in the next couple of days - Okay to liberalize diet - Recommend low-fat, low fiber foods for potential overlapping gastroparesis - May benefit from outpatient GES.  This can be coordinated with her primary Gastroenterologist  2) Hiatal hernia - Large hiatal hernia but no obstruction on CT.  No evidence of gastric volvulus - Agree with surgical consultant that she would be elevated risk for elective hernia repair.  I discussed that at length with the patient and her son today.  Son perseverates on this being the source of "90% of her issues".  After much discussion, she again expressed at the end of the conversation that she knows she would be high risk and not sure she would want surgery, despite his persistence of wanting to pursue surgery - Can consider outpatient Bariatric Surgery consultation  3) Lower abdominal pain 4) Possible early, mild diverticulitis - CT with 6 cm area of thickening in sigmoid colon.  Could be early, uncomplicated diverticulitis vs motion artifact.  Discussed at length today - Start Augmentin - Otherwise no obstruction  GI service will sign off at this time.  Patient planning to follow-up with her primary Gastroenterologist in [place] as an outpatient.   Please do not hesitate to contact us with additional questions or concerns.   Lavena Bullion, DO  03/17/2021, 5:08 PM [facility] Pager [phone]

## 2021-03-18 DIAGNOSIS — R112 Nausea with vomiting, unspecified: Secondary | ICD-10-CM | POA: Diagnosis not present

## 2021-03-18 DIAGNOSIS — N186 End stage renal disease: Secondary | ICD-10-CM | POA: Diagnosis not present

## 2021-03-18 DIAGNOSIS — I480 Paroxysmal atrial fibrillation: Secondary | ICD-10-CM | POA: Diagnosis not present

## 2021-03-18 DIAGNOSIS — K922 Gastrointestinal hemorrhage, unspecified: Secondary | ICD-10-CM | POA: Diagnosis not present

## 2021-03-18 LAB — CBC WITH DIFFERENTIAL/PLATELET
Abs Immature Granulocytes: 0.77 10*3/uL — ABNORMAL HIGH (ref 0.00–0.07)
Basophils Absolute: 0.2 10*3/uL — ABNORMAL HIGH (ref 0.0–0.1)
Basophils Relative: 1 %
Eosinophils Absolute: 0.2 10*3/uL (ref 0.0–0.5)
Eosinophils Relative: 2 %
HCT: 29.2 % — ABNORMAL LOW (ref 36.0–46.0)
Hemoglobin: 9.4 g/dL — ABNORMAL LOW (ref 12.0–15.0)
Immature Granulocytes: 6 %
Lymphocytes Relative: 9 %
Lymphs Abs: 1.2 10*3/uL (ref 0.7–4.0)
MCH: 29.7 pg (ref 26.0–34.0)
MCHC: 32.2 g/dL (ref 30.0–36.0)
MCV: 92.1 fL (ref 80.0–100.0)
Monocytes Absolute: 1.2 10*3/uL — ABNORMAL HIGH (ref 0.1–1.0)
Monocytes Relative: 9 %
Neutro Abs: 10 10*3/uL — ABNORMAL HIGH (ref 1.7–7.7)
Neutrophils Relative %: 73 %
Platelets: 303 10*3/uL (ref 150–400)
RBC: 3.17 MIL/uL — ABNORMAL LOW (ref 3.87–5.11)
RDW: 15.7 % — ABNORMAL HIGH (ref 11.5–15.5)
WBC: 13.6 10*3/uL — ABNORMAL HIGH (ref 4.0–10.5)
nRBC: 0 % (ref 0.0–0.2)

## 2021-03-18 LAB — BASIC METABOLIC PANEL
Anion gap: 11 (ref 5–15)
BUN: 16 mg/dL (ref 8–23)
CO2: 25 mmol/L (ref 22–32)
Calcium: 8.3 mg/dL — ABNORMAL LOW (ref 8.9–10.3)
Chloride: 100 mmol/L (ref 98–111)
Creatinine, Ser: 4.34 mg/dL — ABNORMAL HIGH (ref 0.44–1.00)
GFR, Estimated: 10 mL/min — ABNORMAL LOW (ref >60)
Glucose, Bld: 85 mg/dL (ref 70–99)
Potassium: 4.8 mmol/L (ref 3.5–5.1)
Sodium: 136 mmol/L (ref 135–145)

## 2021-03-18 LAB — GLUCOSE, CAPILLARY
Glucose-Capillary: 97 mg/dL (ref 70–99)
Glucose-Capillary: 89 mg/dL (ref 70–99)

## 2021-03-18 MED ORDER — SUCRALFATE 1 GM/10ML PO SUSP: 1.0000 g | Freq: Two times a day (BID) | ORAL | 0 refills | Status: DC

## 2021-03-18 MED ORDER — AMOXICILLIN-POT CLAVULANATE 500-125 MG PO TABS: 1.0000 | ORAL_TABLET | ORAL | 0 refills | Status: AC

## 2021-03-18 MED ORDER — MIDODRINE HCL 10 MG PO TABS: 10.0000 mg | ORAL_TABLET | Freq: Three times a day (TID) | ORAL | 0 refills | Status: DC

## 2021-03-18 MED ORDER — ALLOPURINOL 100 MG PO TABS: 100.0000 mg | ORAL_TABLET | Freq: Every day | ORAL | Status: AC

## 2021-03-18 NOTE — Discharge Summary (Signed)
Physician Discharge Summary  [name] UXN:[id number] DOB: September 22, 1945 DOA: 03/13/2021  PCP: [name], MD  Admit date: 03/13/2021 Discharge date: 03/18/2021  Admitted From: Home Disposition: Home  Recommendations for Outpatient Follow-up:  Follow up with PCP in 1 week with repeat CBC/BMP Outpatient follow-up with GI        Outpatient follow-up with nephrology as scheduled .  Outpatient follow-up with sgeneral surgery  Follow up in ED if symptoms worsen or new appear   Home Health: Home health PT Equipment/Devices: None  Discharge Condition: Stable CODE STATUS: Full Diet recommendation: Heart healthy/renal hemodialysis diet/carb modified diet  Brief/Interim Summary: 75 y.o. female with medical history significant of ESRD on HD, HTN, HLD, PAF on Eliquis, GERD with esophagitis, OSA on BiPAP, colon polyps, and prior history of COVID-19 pneumonia where hospital course was complicated by upper GI bleed, recent hospitalization from 02/25/2021-02/28/2021 for hematemesis/nausea and vomiting status post EGD on 02/26/2021 which had shown gastritis with superficial erosions and large hiatal hernia at risk for volvulus as per GI presented with nausea, vomiting and bloody bowel movements.  On presentation, hemoglobin was 9.4, WBCs of 15.6, potassium 6.6 with grossly positive blood in stool on exam as per ED provider.  Nephrology and GI were consulted.  She was given Lokelma, NovoLog and dextrose 50% for hyperkalemia.  No EKG changes of hyperkalemia.  During the hospitalization, patient's condition has gradually improved.  She was managed conservatively.  Diet has been gradually advanced by GI.  Nausea is improving.  CT of the abdomen showed possible diverticulitis for which patient has been started on Augmentin.  She has tolerated hemodialysis inpatient but has required midodrine due to hypotension.  She will be advanced to solid food today and if she tolerates it, she will be discharged home with  outpatient follow-up with PCP/GI and dialysis unit.  We will resume Eliquis on discharge since GI has cleared Eliquis to be resumed.  Discharge Diagnoses:   GI bleeding -Patient had gross blood on rectal exam on presentation.  Had recent admission for GI bleeding and EGD on 02/26/2021 had shown gastritis with superficial erosions and large hiatal hernia at risk for volvulus as per GI -Hemoglobin 9.4 on presentation.  Hemoglobin 9.3 last night.  Status post 1 unit packed red cell transfusion on 03/15/2021.  Eliquis currently on hold.  Resume Eliquis on discharge since GI has given clearance for the same. -Hemoglobin stable at 9.4 today.  Outpatient follow-up of hemoglobin.  Continue PPI on discharge.  Intractable nausea and vomiting -Improving.  Treated with round-the-clock Zofran.  Tolerating full liquid diet.  Will advance diet to solid food today and if patient tolerates diet, patient will be discharged home today.  Outpatient follow-up with GI.  Continue as needed antiemetics upon discharge.  Possible acute diverticulitis -CT of the abdomen showed possible diverticulitis.  Patient has been started on Augmentin by GI.  Continue Augmentin for 7 days on discharge.   Hyperkalemia -Presented with potassium of 6.6.  Treated with Lokelma, NovoLog and dextrose 50% in the ED; no EKG changes on presentation. -Resolved.    End-stage renal disease on hemodialysis -Nephrology following.  Dialysis as per nephrology schedule.  Outpatient follow-up with dialysis unit.  Hiatal hernia with GERD and esophagitis -As per recent EGD.  At risk for gastric volvulus.  Abdominal x-ray showed no evidence of volvulus.  Treated with IV Protonix.  Resume oral PPI on discharge.  Outpatient follow-up with GI. -General surgery recommending outpatient follow-up   Leukocytosis -Possibly  reactive.  Outpatient follow-up.  Paroxysmal A. fib -Rate controlled.  Continue amiodarone.  Eliquis plan as above.  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  A1c 5.1 in March 2022.  Carb modified diet. -Outpatient follow-up.  Hypotension -Blood pressure on the lower side.  Midodrine has been resumed which will be continued on discharge.   OSA-continue BiPAP at night   Chronic respiratory failure/chronic obstructive pulmonary disease -Uses 2 L oxygen via nasal cannula as needed.  Currently respiratory status is stable.  Continue home regimen.   Hyperlipidemia -Continue Lipitor   Resting tremor -Outpatient follow-up with neurology   Obesity -Outpatient follow-up   Generalized deconditioning -PT recommends home health PT   Discharge Instructions  Discharge Instructions     Amb referral to [facility]   Complete by: As directed       Allergies as of 03/18/2021   No Known Allergies      Medication List     STOP taking these medications    feeding supplement (NEPRO CARB STEADY) Liqd   oxyCODONE-acetaminophen 5-325 MG tablet Commonly known as: Percocet       TAKE these medications    acetaminophen 325 MG tablet Commonly known as: TYLENOL Take 650 mg by mouth daily as needed for headache (pain).   albuterol (2.5 MG/3ML) 0.083% nebulizer solution Commonly known as: PROVENTIL Take 2.5 mg by nebulization 4 (four) times daily as needed for wheezing or shortness of breath.   allopurinol 100 MG tablet Commonly known as: ZYLOPRIM Take 1 tablet (100 mg total) by mouth at bedtime.   amiodarone 200 MG tablet Commonly known as: PACERONE Take 1 tablet (200 mg total) by mouth in the morning.   amoxicillin-clavulanate 500-125 MG tablet Commonly known as: AUGMENTIN Take 1 tablet (500 mg total) by mouth daily for 7 days.   apixaban 2.5 MG Tabs tablet Commonly known as: ELIQUIS Take 2.5 mg by mouth 2 (two) times daily.   atorvastatin 40 MG tablet Commonly known as: LIPITOR Take 40 mg by mouth daily in the afternoon. In the  afternoon   Biotin 5000 MCG Tabs Take 5,000 mcg by mouth in the morning.   Clear Eyes for Dry Eyes 1-0.25 % Soln Generic drug: Carboxymethylcellul-Glycerin Place 1 drop into both eyes daily as needed (dry eys).   esomeprazole 40 MG capsule Commonly known as: NEXIUM Take 1 capsule (40 mg total) by mouth 2 (two) times daily before a meal.   famotidine 20 MG tablet Commonly known as: PEPCID Take 20 mg by mouth every evening.   ferric citrate 1 GM 210 MG(Fe) tablet Commonly known as: AURYXIA Take 210 mg by mouth 3 (three) times daily with meals.   fluticasone 50 MCG/ACT nasal spray Commonly known as: FLONASE Place 1 spray into both nostrils daily.   gabapentin 100 MG capsule Commonly known as: NEURONTIN Take 100 mg by mouth at bedtime.   midodrine 10 MG tablet Commonly known as: PROAMATINE Take 1 tablet (10 mg total) by mouth 3 (three) times daily with meals.   nitroGLYCERIN 0.4 MG SL tablet Commonly known as: NITROSTAT Place 0.4 mg under the tongue every 5 (five) minutes x 3 doses as needed for chest pain.   ondansetron 4 MG tablet Commonly known as: ZOFRAN Take 4 mg by mouth every 8 (eight) hours as needed for nausea/vomiting, nausea or vomiting.   OXYGEN Inhale 2 L into the lungs See admin instructions. Use every night and as needed during the  day   PRESCRIPTION MEDICATION Inhale into the lungs at bedtime. Bipap   promethazine 12.5 MG tablet Commonly known as: PHENERGAN Take 1 tablet (12.5 mg total) by mouth every 8 (eight) hours as needed for nausea or vomiting.   Salonpas 3.08-10-08 % Ptch Generic drug: Camphor-Menthol-Methyl Sal Place 1 patch onto the skin daily as needed (pain).   sucralfate 1 GM/10ML suspension Commonly known as: CARAFATE Take 10 mLs (1 g total) by mouth 2 (two) times daily for 14 days.   Trelegy Ellipta 100-62.5-25 MCG/INH Aepb Generic drug: Fluticasone-Umeclidin-Vilant Inhale 1 puff into the lungs daily at 12 noon.   Vitamin D3 50  MCG (2000 UT) Tabs Take 2,000 Units by mouth in the morning.         Follow-up Information     [facility] [place] Follow up.   Specialty: General Surgery Why: As needed for hiatal hernia.  Would need to see a foregut surgeon Contact information: [place] [address] [phone]         [name], MD. Schedule an appointment as soon as possible for a visit in 1 week(s).   Specialty: Family Medicine Contact information: [place] [address] [phone]         Satira Sark, MD .   Specialty: Cardiology Contact information: [place] [address] [phone]                No Known Allergies  Consultations: GI/nephrology   Procedures/Studies: DG Chest 2 View  Result Date: 02/25/2021 CLINICAL DATA:  Cough. EXAM: CHEST - 2 VIEW COMPARISON:  07/19/2020 FINDINGS: The lungs are clear without focal pneumonia, edema, pneumothorax or pleural effusion. The cardiopericardial silhouette is within normal limits for size. Large hiatal hernia again noted. Right Port-A-Cath is been removed in the interval. Left IJ central line remains in place. IMPRESSION: 1. No acute cardiopulmonary findings. 2. Large hiatal hernia. Electronically Signed   By: Misty Stanley M.D.   On: 02/25/2021 09:25   DG Abd 1 View  Result Date: 03/13/2021 CLINICAL DATA:  Intractable nausea and vomiting. EXAM: ABDOMEN - 1 VIEW COMPARISON:  07/17/2019 FINDINGS: There is diffuse gaseous distension of small bowel with air in stool seen scattered along the course of the colon. No overt small bowel obstruction by x-ray. Lower lumbar fusion hardware evident. Atherosclerotic calcification noted in the dominant arterial anatomy of the lower abdomen and pelvis. Bones are diffusely demineralized. IMPRESSION: Diffuse gaseous distention of small bowel with air in stool scattered along the length of the colon. Bowel gas pattern is not  overtly obstructive although component of ileus or gastroenteritis a consideration. CT imaging could be used to further evaluate as clinically warranted. Electronically Signed   By: Misty Stanley M.D.   On: 03/13/2021 20:14   CT HEAD WO CONTRAST (5MM)  Result Date: 03/14/2021 CLINICAL DATA:  Nausea and vomiting EXAM: CT HEAD WITHOUT CONTRAST TECHNIQUE: Contiguous axial images were obtained from the base of the skull through the vertex without intravenous contrast. COMPARISON:  CT head 04/24/2020 FINDINGS: Brain: Mild cerebral atrophy. Mild white matter hypodensity bilaterally which appears chronic and unchanged. Negative for acute infarct, hemorrhage, hydrocephalus Vascular: Negative for hyperdense vessel Skull: Negative Sinuses/Orbits: Paranasal sinuses clear.  No orbital mass Other: None IMPRESSION: No acute abnormality no change from prior CT. Mild atrophy and mild chronic microvascular ischemic change in the white matter. Electronically Signed   By: Franchot Gallo  M.D.   On: 03/14/2021 18:03   CT ABDOMEN PELVIS W CONTRAST  Result Date: 03/17/2021 CLINICAL DATA:  Abdominal pain. Persistent nausea and vomiting. End-stage renal disease on dialysis. EXAM: CT ABDOMEN AND PELVIS WITH CONTRAST TECHNIQUE: Multidetector CT imaging of the abdomen and pelvis was performed using the standard protocol following bolus administration of intravenous contrast. CONTRAST:  133mL OMNIPAQUE IOHEXOL 350 MG/ML SOLN COMPARISON:  CT 07/17/2019, 04/24/2020 FINDINGS: Lower chest: There is atelectasis at the RIGHT lung base related to the hiatal hernia. Mild bibasilar atelectasis on the LEFT and RIGHT is increased from comparison exam. There is small bilateral pleural effusions. Hepatobiliary: No focal hepatic lesion. Postcholecystectomy. No biliary dilatation. Pancreas: Pancreas is normal. No ductal dilatation. No pancreatic inflammation. Spleen: Normal spleen Adrenals/urinary tract: Adrenal glands normal. Bilateral renal cortical  thinning. Bladder normal Stomach/Bowel: There is a mildly complex hilar hernia again demonstrated. This hernia appears relatively stable over multiple comparison CT exams. No high-grade obstruction as contrast passes through the entirety of the small bowel to the colon. Small bowel is normal. Terminal ileum is normal. Appendix is normal. There multiple diverticula the ascending colon without acute inflammation. Multiple diverticula of the descending colon and sigmoid colon. There is some thickening in the proximal sigmoid colon over a 6 cm segment (66/5. Rectum normal Vascular/Lymphatic: Abdominal aorta is normal caliber with atherosclerotic calcification. There is no retroperitoneal or periportal lymphadenopathy. No pelvic lymphadenopathy. Reproductive: Post hysterectomy.  Adnexa unremarkable Other: No free fluid. Musculoskeletal: No aggressive osseous lesion. IMPRESSION: 1. Thickening through the sigmoid colon could represent mild diverticulitis. Extensive diverticulosis involving the LEFT and RIGHT colon. 2. Large hiatal hernia not changed over multiple comparison exams. The entire hernia is not imaged. 3. Basilar atelectasis related to hilar hernia. Small bilateral pleural effusions. Electronically Signed   By: Suzy Bouchard M.D.   On: 03/17/2021 08:08   DG CHEST PORT 1 VIEW  Result Date: 03/15/2021 CLINICAL DATA:  End-stage renal disease EXAM: PORTABLE CHEST 1 VIEW COMPARISON:  02/25/2021 FINDINGS: Left-sided central venous catheter tip over the SVC. Mild cardiomegaly with vascular congestion. No pleural effusion or pneumothorax. Moderate to large hiatal hernia IMPRESSION: 1. Mild cardiomegaly with vascular congestion 2. Hiatal hernia Electronically Signed   By: Donavan Foil M.D.   On: 03/15/2021 19:31   ECHOCARDIOGRAM COMPLETE  Result Date: 03/15/2021    ECHOCARDIOGRAM REPORT   Patient Name:   [name] Date of Exam: 03/15/2021 Medical Rec #:  [id number]          Height:       56.0 in  Accession #:    [id number]         Weight:       169.1 lb Date of Birth:  Dec 06, 1945          BSA:          1.653 m Patient Age:    75 years           BP:           118/47 mmHg Patient Gender: F                  HR:           76 bpm. Exam Location:  Inpatient Procedure: Intracardiac Opacification Agent, Cardiac Doppler, Color Doppler and            2D Echo Indications:    Dyspnea R06.00  History:        Patient has prior history of Echocardiogram examinations,  most                 recent 07/27/2019. Arrythmias:Atrial Fibrillation; Risk                 Factors:Hypertension, Dyslipidemia and Diabetes. GERD. End stage                 renal disease. Chronic respiratory failure/chronic obstructive                 pulmonary disease.  Sonographer:    Darlina Sicilian RDCS Referring Phys: [id number] [facility]  Sonographer Comments: No parasternal window, suboptimal apical window and suboptimal subcostal window. Hemodialysis Catheter Left Subclavian limits parasternal imaging. IMPRESSIONS  1. Technically difficult study - essentially only Definity contrast images were interpretable  2. Left ventricular ejection fraction, by estimation, is 65 to 70%. The left ventricle has normal function. The left ventricle has no regional wall motion abnormalities. Left ventricular diastolic parameters are consistent with Grade I diastolic dysfunction (impaired relaxation). Elevated left ventricular end-diastolic pressure.  3. Right ventricular systolic function was not well visualized. The right ventricular size is not well visualized.  4. The mitral valve was not well visualized. No evidence of mitral valve regurgitation.  5. The aortic valve was not well visualized. Aortic valve regurgitation is not visualized. Comparison(s): Changes from prior study are noted. 07/27/2019: LVEF 55-60%. FINDINGS  Left Ventricle: Left ventricular ejection fraction, by estimation, is 65 to 70%. The left ventricle has normal function. The left ventricle has  no regional wall motion abnormalities. Definity contrast agent was given IV to delineate the left ventricular  endocardial borders. The left ventricular internal cavity size was normal in size. There is no left ventricular hypertrophy. Left ventricular diastolic function could not be evaluated due to atrial fibrillation. Left ventricular diastolic parameters are  consistent with Grade I diastolic dysfunction (impaired relaxation). Elevated left ventricular end-diastolic pressure. Right Ventricle: The right ventricular size is not well visualized. Right vetricular wall thickness was not well visualized. Right ventricular systolic function was not well visualized. Left Atrium: Left atrial size was normal in size. Right Atrium: Right atrial size was normal in size. Pericardium: The pericardium was not well visualized. Mitral Valve: The mitral valve was not well visualized. No evidence of mitral valve regurgitation. Tricuspid Valve: The tricuspid valve is grossly normal. Tricuspid valve regurgitation is trivial. Aortic Valve: The aortic valve was not well visualized. Aortic valve regurgitation is not visualized. Pulmonic Valve: The pulmonic valve was not well visualized. Pulmonic valve regurgitation is not visualized. Aorta: The aortic root was not well visualized. IAS/Shunts: No atrial level shunt detected by color flow Doppler.   Diastology LV e' medial:    4.50 cm/s LV E/e' medial:  26.7 LV e' lateral:   6.75 cm/s LV E/e' lateral: 17.8  LEFT ATRIUM           Index LA Vol (A4C): 49.5 ml 29.95 ml/m  AORTIC VALVE LVOT Vmax:   101.00 cm/s LVOT Vmean:  61.700 cm/s LVOT VTI:    0.193 m MITRAL VALVE                TRICUSPID VALVE MV Area (PHT): 3.36 cm     TR Peak grad:   42.5 mmHg MV Decel Time: 226 msec     TR Vmax:        326.00 cm/s MV E velocity: 120.00 cm/s MV A velocity: 98.50 cm/s   SHUNTS MV E/A ratio:  1.22  Systemic VTI: 0.19 m Lyman Bishop MD Electronically signed by Lyman Bishop MD Signature  Date/Time: 03/15/2021/3:10:25 PM    Final       Subjective: Patient seen and examined at bedside.  Poor historian.  Feels okay to go home today.  Denies any current vomiting.  No overnight fever or worsening shortness of breath reported.  Discharge Exam: Vitals:   03/18/21 0500 03/18/21 0924  BP: (!) 134/48 (!) 116/44  Pulse: 60 72  Resp: 16 19  Temp: 98.1 F (36.7 C) 98.1 F (36.7 C)  SpO2: 92% 91%    General: Pt is alert, awake, not in acute distress.  Elderly female lying in bed.  Slow to respond.  Flat affect. Cardiovascular: rate controlled, S1/S2 + Respiratory: bilateral decreased breath sounds at bases with some scattered crackles Abdominal: Soft, NT, ND, bowel sounds + Extremities:  trace lower extremity edema; no cyanosis    The results of significant diagnostics from this hospitalization (including imaging, microbiology, ancillary and laboratory) are listed below for reference.     Microbiology: Recent Results (from the past 240 hour(s))  Resp Panel by RT-PCR (Flu A&B, Covid) Nasopharyngeal Swab     Status: None   Collection Time: 03/13/21  2:10 PM   Specimen: Nasopharyngeal Swab; Nasopharyngeal(NP) swabs in vial transport medium  Result Value Ref Range Status   SARS Coronavirus 2 by RT PCR NEGATIVE NEGATIVE Final    Comment: (NOTE) SARS-CoV-2 target nucleic acids are NOT DETECTED.  The SARS-CoV-2 RNA is generally detectable in upper respiratory specimens during the acute phase of infection. The lowest concentration of SARS-CoV-2 viral copies this assay can detect is 138 copies/mL. A negative result does not preclude SARS-Cov-2 infection and should not be used as the sole basis for treatment or other patient management decisions. A negative result may occur with  improper specimen collection/handling, submission of specimen other than nasopharyngeal swab, presence of viral mutation(s) within the areas targeted by this assay, and inadequate number of  viral copies(<138 copies/mL). A negative result must be combined with clinical observations, patient history, and epidemiological information. The expected result is Negative.  Fact Sheet for Patients:  EntrepreneurPulse.com.au  Fact Sheet for Healthcare Providers:  IncredibleEmployment.be  This test is no t yet approved or cleared by the [place] FDA and  has been authorized for detection and/or diagnosis of SARS-CoV-2 by FDA under an Emergency Use Authorization (EUA). This EUA will remain  in effect (meaning this test can be used) for the duration of the COVID-19 declaration under Section 564(b)(1) of the Act, 21 U.S.C.section 360bbb-3(b)(1), unless the authorization is terminated  or revoked sooner.       Influenza A by PCR NEGATIVE NEGATIVE Final   Influenza B by PCR NEGATIVE NEGATIVE Final    Comment: (NOTE) The Xpert Xpress SARS-CoV-2/FLU/RSV plus assay is intended as an aid in the diagnosis of influenza from Nasopharyngeal swab specimens and should not be used as a sole basis for treatment. Nasal washings and aspirates are unacceptable for Xpert Xpress SARS-CoV-2/FLU/RSV testing.  Fact Sheet for Patients: EntrepreneurPulse.com.au  Fact Sheet for Healthcare Providers: IncredibleEmployment.be  This test is not yet approved or cleared by the [place] FDA and has been authorized for detection and/or diagnosis of SARS-CoV-2 by FDA under an Emergency Use Authorization (EUA). This EUA will remain in effect (meaning this test can be used) for the duration of the COVID-19 declaration under Section 564(b)(1) of the Act, 21 U.S.C. section 360bbb-3(b)(1), unless the authorization is terminated or revoked.  Performed at [facility], [place] [address], [place], [place] [address]      Labs: BNP (last 3 results) No results for input(s): BNP in the last 8760 hours. Basic Metabolic  Panel: Recent Labs  Lab 03/14/21 0902 03/15/21 0445 03/16/21 0618 03/16/21 2324 03/17/21 0751 03/18/21 0653  NA 139 136 136 135 134* 136  K 5.6* 3.8 3.9 4.1 4.0 4.8  CL 97* 98 97* 94* 94* 100  CO2 29 29 28 27 30 25   GLUCOSE 115* 72 75 111* 119* 85  BUN 63* 20 30* 36* 38* 16  CREATININE 9.46* 4.52* 5.78* 6.89* 6.96* 4.34*  CALCIUM 8.7* 7.7* 8.4* 8.6* 8.3* 8.3*  MG  --   --   --  2.0  --   --   PHOS 4.2  --   --   --  4.1  --    Liver Function Tests: Recent Labs  Lab 03/13/21 1106 03/14/21 0902 03/17/21 0751  AST 10*  --   --   ALT 9  --   --   ALKPHOS 92  --   --   BILITOT 0.5  --   --   PROT 6.0*  --   --   ALBUMIN 2.0* 1.6* 1.7*   Recent Labs  Lab 03/13/21 1106  LIPASE 26   No results for input(s): AMMONIA in the last 168 hours. CBC: Recent Labs  Lab 03/15/21 0445 03/15/21 1806 03/16/21 0618 03/16/21 1504 03/16/21 2324 03/17/21 0730 03/17/21 1949 03/18/21 0653  WBC 14.5*  --  10.6*  --  14.4* 11.9*  --  13.6*  NEUTROABS 12.1*  --  7.8*  --  10.8* 9.2*  --  10.0*  HGB 7.1*   < > 8.5* 9.4* 9.3* 8.7* 8.8* 9.4*  HCT 21.8*   < > 26.9* 27.7* 28.5* 27.0* 27.1* 29.2*  MCV 90.8  --  91.5  --  88.5 90.6  --  92.1  PLT 301  --  295  --  364 318  --  303   < > = values in this interval not displayed.   Cardiac Enzymes: No results for input(s): CKTOTAL, CKMB, CKMBINDEX, TROPONINI in the last 168 hours. BNP: Invalid input(s): POCBNP CBG: Recent Labs  Lab 03/17/21 0632 03/17/21 1135 03/17/21 1647 03/17/21 2132 03/18/21 0640  GLUCAP 75 74 97 105* 97   D-Dimer No results for input(s): DDIMER in the last 72 hours. Hgb A1c No results for input(s): HGBA1C in the last 72 hours. Lipid Profile No results for input(s): CHOL, HDL, LDLCALC, TRIG, CHOLHDL, LDLDIRECT in the last 72 hours. Thyroid function studies Recent Labs    03/16/21 0618  TSH 0.691   Anemia work up No results for input(s): VITAMINB12, FOLATE, FERRITIN, TIBC, IRON, RETICCTPCT in the last  72 hours. Urinalysis    Component Value Date/Time   COLORURINE YELLOW 04/24/2020 1528   APPEARANCEUR HAZY (A) 04/24/2020 1528   LABSPEC 1.013 04/24/2020 1528   PHURINE 7.0 04/24/2020 1528   GLUCOSEU NEGATIVE 04/24/2020 1528   HGBUR NEGATIVE 04/24/2020 1528   BILIRUBINUR NEGATIVE 04/24/2020 1528   KETONESUR NEGATIVE 04/24/2020 1528   PROTEINUR 100 (A) 04/24/2020 1528   UROBILINOGEN 0.2 01/29/2007 1500   NITRITE NEGATIVE 04/24/2020 [place] 04/24/2020 1528   Sepsis Labs Invalid input(s): PROCALCITONIN,  WBC,  LACTICIDVEN Microbiology Recent Results (from the past 240 hour(s))  Resp Panel by RT-PCR (Flu A&B, Covid) Nasopharyngeal Swab     Status: None   Collection Time:  03/13/21  2:10 PM   Specimen: Nasopharyngeal Swab; Nasopharyngeal(NP) swabs in vial transport medium  Result Value Ref Range Status   SARS Coronavirus 2 by RT PCR NEGATIVE NEGATIVE Final    Comment: (NOTE) SARS-CoV-2 target nucleic acids are NOT DETECTED.  The SARS-CoV-2 RNA is generally detectable in upper respiratory specimens during the acute phase of infection. The lowest concentration of SARS-CoV-2 viral copies this assay can detect is 138 copies/mL. A negative result does not preclude SARS-Cov-2 infection and should not be used as the sole basis for treatment or other patient management decisions. A negative result may occur with  improper specimen collection/handling, submission of specimen other than nasopharyngeal swab, presence of viral mutation(s) within the areas targeted by this assay, and inadequate number of viral copies(<138 copies/mL). A negative result must be combined with clinical observations, patient history, and epidemiological information. The expected result is Negative.  Fact Sheet for Patients:  EntrepreneurPulse.com.au  Fact Sheet for Healthcare Providers:  IncredibleEmployment.be  This test is no t yet approved or cleared  by the [place] FDA and  has been authorized for detection and/or diagnosis of SARS-CoV-2 by FDA under an Emergency Use Authorization (EUA). This EUA will remain  in effect (meaning this test can be used) for the duration of the COVID-19 declaration under Section 564(b)(1) of the Act, 21 U.S.C.section 360bbb-3(b)(1), unless the authorization is terminated  or revoked sooner.       Influenza A by PCR NEGATIVE NEGATIVE Final   Influenza B by PCR NEGATIVE NEGATIVE Final    Comment: (NOTE) The Xpert Xpress SARS-CoV-2/FLU/RSV plus assay is intended as an aid in the diagnosis of influenza from Nasopharyngeal swab specimens and should not be used as a sole basis for treatment. Nasal washings and aspirates are unacceptable for Xpert Xpress SARS-CoV-2/FLU/RSV testing.  Fact Sheet for Patients: EntrepreneurPulse.com.au  Fact Sheet for Healthcare Providers: IncredibleEmployment.be  This test is not yet approved or cleared by the [place] FDA and has been authorized for detection and/or diagnosis of SARS-CoV-2 by FDA under an Emergency Use Authorization (EUA). This EUA will remain in effect (meaning this test can be used) for the duration of the COVID-19 declaration under Section 564(b)(1) of the Act, 21 U.S.C. section 360bbb-3(b)(1), unless the authorization is terminated or revoked.  Performed at [facility], [place] [address], [place],  [address]      Time coordinating discharge: 35 minutes  SIGNED:   Aline August, MD  [facility] 03/18/2021, 11:20 AM

## 2021-03-18 NOTE — TOC Transition Note (Signed)
Transition of Care [facility]) - CM/SW Discharge Note   Patient Details  Name: AMORAH SEBRING MRN: [id number] Date of Birth: 12-Apr-1946  Transition of Care [facility]) CM/SW Contact:  Bartholomew Crews, RN Phone Number: [phone] 03/18/2021, 12:26 PM   Clinical Narrative:     Spoke with patient on hospital room phone to discuss transition plans. Patient agreeable to home health PT. Would like to use [facility] again. Referral accepted. HH orders placed by MD. Patient to transition home today. No further TOC needs identified at this time.   Final next level of care: [place] Barriers to Discharge: No Barriers Identified   Patient Goals and CMS Choice Patient states their goals for this hospitalization and ongoing recovery are:: return home CMS Medicare.gov Compare Post Acute Care list provided to:: Patient Choice offered to / list presented to : Patient  Discharge Placement                       Discharge Plan and Services   Discharge Planning Services: CM Consult Post Acute Care Choice: Home Health          DME Arranged: N/A DME Agency: NA       HH Arranged: PT HH Agency: [place] Date [facility] Agency Contacted: 03/18/21 Time [place] Agency Contacted: 9 Representative spoke with at [place]: [place] ([place]) Interventions     Readmission Risk Interventions Readmission Risk Prevention Plan 07/20/2020 04/28/2020 03/08/2020  Transportation Screening Complete Complete Complete  PCP or Specialist Appt within 3-5 Days - Not Complete -  Not Complete comments - going to SNF -  [place] or [place] - Complete -  [place] or Home Care Consult comments - - -  Social Work Consult for [place] Planning/Counseling - Complete -  Palliative Care Screening - Not Applicable -  Medication Review (RN Care Manager) Referral to Pharmacy Complete Complete  PCP or Specialist appointment within 3-5 days of discharge Complete - Complete   PCP/Specialist Appt Not Complete comments - - -  HRI or Home Care Consult Complete - Complete  SW Recovery Care/Counseling Consult Complete - Complete  Palliative Care Screening Not Applicable - Not [place] Not Applicable - Not Applicable  Some recent data might be hidden

## 2021-03-18 NOTE — Progress Notes (Signed)
[facility] Progress Note  Subjective: no c/o today, seen in room, no SOB, lying flat  Vitals:   03/17/21 1650 03/17/21 2135 03/18/21 0500 03/18/21 0924  BP: (!) 102/35 (!) 108/44 (!) 134/48 (!) 116/44  Pulse: 71 71 60 72  Resp: 18 18 16 19   Temp: 98.3 F (36.8 C) 99.2 F (37.3 C) 98.1 F (36.7 C) 98.1 F (36.7 C)  TempSrc: Oral Oral Oral   SpO2: 96% 91% 92% 91%  Weight:  80.2 kg    Height:        Exam:  alert, nad   no jvd  Chest cta bilat  Cor reg no RG  Abd soft ntnd no ascites   Ext no LE edema, 1-2+ focal LUE edema   Alert, NF, ox3   RUA AVF maturing/ TDC    OP HD: [facility] TTS   4h  400/800   73kg  2/2.5 bath  Hep none  TDC/ AVF RUE maturing   -Venofer 100 mg IV X 10 doses (4/10 dose given)   -Mircera 100 mcg q 2 weeks (last 02/15/2021)   -Calcitriol 1.25 mcg PO TIW       Assessment/ Plan: N/V/ abd pain - per GI multifactorial due to gastroparesis, ESRD, very large hiatal hernia. At risk for gastric volvulus. Gen surg consulted, they will f/u in OP setting. PPI Rx. For DC home today.  GI bleed - per GI/ primary team did not need repeat endoscopy. Ok to resume eliquis at dc. SP 1u prbc here. Hb stable at 9.4 now.  BP/ volume/ hypotension - has been having problems w/ low BP's at OP HD. W/u here showed echo LVEF wnl, RV not seen. Last echo showed severe pHTN 68 mm Hg.  Cortisol wnl, TSH wnl. Suspect lean body wt gain, +/- RV failure d/t cor pulmonale. We let weights come up sig to 80kg (dry wt 73kg) and BP's improved. Raise dry wt at dc to 80kg. Will follow closely in OP setting.  ESRD - on HD TTS.  On HD for about 2 years. Had just 2 HD sessions this week here due to staffing issues. Last HD Sat here.  Anemia - of CKD and/or GI blood loss. Is on eliquis, got 1u prbc for Hb 7.1.  Last OP esa 3 wks ago, will redosed here w/ darbe 150 ug SQ x 1.   HD access - sp RUA AVF 2nd stage surgery in June, using [facility], pt to f/u w/ [facility] 8/29 COPD/ OSA AFib   Gout DM2    [name] 03/18/2021, 12:47 PM   Recent Labs  Lab 03/14/21 0902 03/14/21 1247 03/17/21 0751 03/17/21 1949 03/18/21 0653  K 5.6*   < > 4.0  --  4.8  BUN 63*   < > 38*  --  16  CREATININE 9.46*   < > 6.96*  --  4.34*  CALCIUM 8.7*   < > 8.3*  --  8.3*  PHOS 4.2  --  4.1  --   --   HGB  --    < >  --  8.8* 9.4*   < > = values in this interval not displayed.    Inpatient medications:  sodium chloride   Intravenous Once   allopurinol  100 mg Oral QHS   amiodarone  200 mg Oral q AM   amoxicillin-clavulanate  1 tablet Oral Q24H   atorvastatin  40 mg Oral Q1500   calcitRIOL  1.25 mcg Oral Q M,W,F-HD  Chlorhexidine Gluconate Cloth  6 each Topical Q0600   darbepoetin (ARANESP) injection - NON-DIALYSIS  150 mcg Subcutaneous Q Thu-1800   famotidine  20 mg Oral QPM   feeding supplement (NEPRO CARB STEADY)  237 mL Oral BID BM   ferric citrate  210 mg Oral TID WC   fluticasone furoate-vilanterol  1 puff Inhalation Daily   gabapentin  100 mg Oral QHS   insulin aspart  0-6 Units Subcutaneous TID WC   midodrine  10 mg Oral TID WC   ondansetron  4 mg Oral Q8H   pantoprazole (PROTONIX) IV  40 mg Intravenous Daily   sodium chloride flush  3 mL Intravenous Q12H   umeclidinium bromide  1 puff Inhalation Daily    sodium chloride     promethazine (PHENERGAN) injection (IM or IVPB) 12.5 mg (03/14/21 1726)   sodium chloride, acetaminophen, albuterol, nitroGLYCERIN, promethazine (PHENERGAN) injection (IM or IVPB), sodium chloride flush

## 2021-03-19 ENCOUNTER — Telehealth: Payer: Self-pay | Admitting: Nephrology

## 2021-03-19 NOTE — Telephone Encounter (Signed)
Transition of care contact from inpatient facility  Date of Discharge:  03/18/21 Date of Contact: 03/19/21 Method of contact: Phone -attempt   Attempted to contact patient to discuss transition of care from inpatient admission. Patient did not answer the phone. Message was left on the patient's voicemail.

## 2021-04-02 ENCOUNTER — Ambulatory Visit (INDEPENDENT_AMBULATORY_CARE_PROVIDER_SITE_OTHER): Payer: Medicare Other | Admitting: Physician Assistant

## 2021-04-02 ENCOUNTER — Other Ambulatory Visit: Payer: Self-pay

## 2021-04-02 ENCOUNTER — Ambulatory Visit (HOSPITAL_COMMUNITY)
Admission: RE | Admit: 2021-04-02 | Discharge: 2021-04-02 | Disposition: A | Discharge status: Home / Self Care | Payer: Medicare Other | Source: Ambulatory Visit | Attending: Surgery | Admitting: Surgery

## 2021-04-02 VITALS — BP 96/35 | HR 63 | Temp 97.4°F | Resp 20 | Ht <= 58 in | Wt 159.0 lb

## 2021-04-02 DIAGNOSIS — Z992 Dependence on renal dialysis: Secondary | ICD-10-CM

## 2021-04-02 DIAGNOSIS — N186 End stage renal disease: Secondary | ICD-10-CM

## 2021-04-02 NOTE — Progress Notes (Signed)
Office Note     CC:  follow up Requesting Provider:  [name], MD  HPI: [name] is a 75 y.o. ([birth year]) female who presents for evaluation of right lower extremity status post second stage basilic vein transposition in June of this year.  Prior access surgery involves left basilic vein transposition which only function for 2 weeks of dialysis.  She then had a left arm AV graft placed which thrombosed prior to use.  She had a venogram of right upper extremity and central venous system which was negative for hemodynamically significant stenosis.  She did have a right chest venous port for iron transfusion which was moved prior to creation of right basilic vein fistula.  She is currently dialyzing via left IJ [facility] on a Tuesday Thursday Saturday schedule at the [facility] location.   Past Medical History:  Diagnosis Date   Blood transfusion without reported diagnosis    CAD (coronary artery disease)    Nonobstructive at cardiac catheterization 2000   Cataract    Cervical cancer ([place]) 1978   CHF (congestive heart failure) (HCC)    Chronic back pain    Chronic kidney disease    Class 2 obesity with body mass index (BMI) of 35 to 39.9 without comorbidity    Complication of anesthesia    hard to awaken with one back surgery - years ago, no problem since   COPD (chronic obstructive pulmonary disease) ([place])    Degenerative disc disease    DM (diabetes mellitus), type 2 with renal complications ([place])    Dyslipidemia    Dyspnea    "due to COPD"   Dysrhythmia    a-fib   ESRD (end stage renal disease) on dialysis ([place])    TTHS- [address]   GERD (gastroesophageal reflux disease)    Gout    Hiatal hernia 07/27/2013   History of diverticulitis of colon    History of hiatal hernia    HTN (hypertension)    09/09/20 not currently on medication, is on medication for hypotension   Hypotension    Iron deficiency anemia    Irritable bowel syndrome    Lumbar  radiculopathy    Mixed hyperlipidemia    Moderate major depression, 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 States this was cervical cancer   Oxygen deficiency    room air   PAF (paroxysmal atrial fibrillation) (HCC)    Pneumonia    Sleep apnea    bipap - oxygen 2 l to bipap.   Type 2 diabetes mellitus ([place])    Vitamin B deficiency 12/25/2009   Vitamin B12 deficiency     Past Surgical History:  Procedure Laterality Date   ABDOMINAL HYSTERECTOMY     APPLICATION OF WOUND VAC Right 01/15/2021   Procedure: APPLICATION OF WOUND VAC;  Surgeon: Cherre Robins, MD;  Location: [place];  Service: Vascular;  Laterality: Right;   AV FISTULA PLACEMENT Left 08/05/2019   Procedure: ARTERIOVENOUS (AV) FISTULA CREATION LEFT ARM;  Surgeon: Waynetta Sandy, MD;  Location: [place];  Service: Vascular;  Laterality: Left;   AV FISTULA PLACEMENT Left 09/11/2020   Procedure: INSERTION OF ARTERIOVENOUS (AV) GORE-TEX GRAFT ARM LEFT;  Surgeon: Cherre Robins, MD;  Location: [place];  Service: Vascular;  Laterality: Left;   AV FISTULA PLACEMENT Right 11/29/2020   Procedure: Creation of  Arteriovenous Fistula Right Upper Arm;  Surgeon: Rosetta Posner, MD;  Location: [place];  Service: Vascular;  Laterality: Right;   [place] Left 10/05/2019   Procedure: SECOND STAGE LEFT BASCILIC VEIN TRANSPOSITION;  Surgeon: Waynetta Sandy, MD;  Location: [place];  Service: Vascular;  Laterality: Left;   [place] Right 01/15/2021   Procedure: RIGHT UPPER ARM SECOND STAGE [place];  Surgeon: Cherre Robins, MD;  Location: [place];  Service: Vascular;  Laterality: Right;  PERIPHERAL NERVE BLOCK   Benign breast cysts     BIOPSY  02/26/2021   Procedure: BIOPSY;  Surgeon: Jackquline Denmark, MD;  Location: [facility] ENDOSCOPY;  Service: Endoscopy;;    CHOLECYSTECTOMY     COLONOSCOPY  10/01/2006   SLF:Pan colonic diverticulosis and moderate internal hemorrhoids/ Otherwise no polyps, masses, inflammatory changes or AVMs/   COLONOSCOPY  2011   SLF: pancolonic diverticulosis, large internal hemorrhoids   COLONOSCOPY N/A 01/26/2016   Procedure: COLONOSCOPY;  Surgeon: [name], MD;  [facility];  Service: Endoscopy;  Laterality: N/A;  830    COLONOSCOPY WITH PROPOFOL N/A 02/10/2020   Procedure: COLONOSCOPY WITH PROPOFOL;  Surgeon: Daneil Dolin, MD;  [facility];  Service: Endoscopy;  Laterality: N/A;  10:45am   ESOPHAGEAL DILATION  02/26/2021   Procedure: ESOPHAGEAL DILATION;  Surgeon: Jackquline Denmark, MD;  Location[facility] ENDOSCOPY;  Service: Endoscopy;;   ESOPHAGOGASTRODUODENOSCOPY  11/19/2006   SLF: Large hiatal hernia without evidence of [place] ulcers/. Distal esophageal stricture, which allowed the gastroscope to pass without resistance.  A 16 mm Savary later passed with mild resistance/ Normal stomach.sb bx negative   ESOPHAGOGASTRODUODENOSCOPY  10/01/2006   DUK:GURKY hiatal hernia.  Distal esophagus without evidence of   erythema, ulceration or Barrett's esophagus   ESOPHAGOGASTRODUODENOSCOPY  2011   SLF: large hh, distal esophageal web narrowing to 75mm s/p dilation to 69mm   ESOPHAGOGASTRODUODENOSCOPY N/A 08/06/2013   SLF: 1. Stricture at the gastroesophageal junction 2. large hiatal hernia. 3. Mild erosive gastritis.   ESOPHAGOGASTRODUODENOSCOPY (EGD) WITH PROPOFOL N/A 07/19/2019   rourk: Mild erosive reflux esophagitis.  Mild Schatzki ring status post dilation.  Large hiatal hernia with at least one half of the stomach above the diaphragm.  Gastric mucosa erythematous.   ESOPHAGOGASTRODUODENOSCOPY (EGD) WITH PROPOFOL N/A 02/26/2021   Procedure: ESOPHAGOGASTRODUODENOSCOPY (EGD) WITH PROPOFOL;  Surgeon: Jackquline Denmark, MD;  Location[facility]COPY;  Service: Endoscopy;  Laterality: N/A;   GIVENS CAPSULE STUDY N/A  08/06/2013   INCOMPLETE-SMALL BOWLE ULCERS   IR FLUORO GUIDE CV LINE LEFT  07/29/2019   IR US GUIDE VASC ACCESS LEFT  07/29/2019   KNEE SURGERY Right    PARTIAL HYSTERECTOMY  1978   PORT-A-CATH REMOVAL Right 11/29/2020   Procedure: REMOVAL OF RIGHT CHEST PORT;  Surgeon: Rosetta Posner, MD;  Location: [place];  Service: Vascular;  Laterality: Right;   small bowel capsule  2008   negative   SPINE SURGERY     TONSILLECTOMY AND ADENOIDECTOMY     Two back surgeries/fusion     UMBILICAL HERNIA REPAIR  2010   UPPER EXTREMITY VENOGRAPHY N/A 11/22/2020   Procedure: UPPER EXTREMITY VENOGRAPHY;  Surgeon: Cherre Robins, MD;  Location: [place] CV LAB;  Service: Cardiovascular;  Laterality: N/A;    Social History   Socioeconomic History   Marital status: Widowed    Spouse name: Thackeray   Number of children: 4   Years of  education: Not on file   Highest education level: 10th grade  Occupational History   Occupation: retired    Comment: Ambulance person, [employer] work   Occupation: retired  Tobacco Use   Smoking status: Former    Packs/day: 1.00    Years: 1.00    Pack years: 1.00    Types: Cigarettes    Start date: 02/19/1961    Quit date: 08/05/1961    Years since quitting: 59.6   Smokeless tobacco: Never   Tobacco comments:    1 year in her lifetime  Vaping Use   Vaping Use: Never used  Substance and Sexual Activity   Alcohol use: Never   Drug use: Never   Sexual activity: Not Currently  Other Topics Concern   Not on file  Social History Narrative   ** Merged History Encounter **       HAS 4 SON-GRAND KIDS [place]. Lives in home with [place] - married 13 Y Cook, sew, quilt, crochet   Social Determinants of Health   Financial Resource Strain: Not on file  Food Insecurity: Not on file  Transportation Needs: Not on file  Physical Activity: Not on file  Stress: Not on file  Social Connections: Not on file  Intimate Partner Violence: Not on file    Family History   Problem Relation Age of Onset   Colon cancer Brother        diagnosed age 7. Living.    Ulcers Sister    Diabetes Sister    Heart attack Sister    Kidney failure Sister    Stroke Sister    Ulcers Mother    Diabetes Mother    Heart attack Mother    Stroke Mother    Asthma Mother    Heart disease Mother    Cervical cancer Mother    Heart attack Brother    Heart disease Brother    Asthma Sister    Diabetes Brother    Stroke Maternal Grandmother    Heart attack Maternal Grandmother    Heart attack Other    Early death Father        MVA in his 71s    Current Outpatient Medications  Medication Sig Dispense Refill   acetaminophen (TYLENOL) 325 MG tablet Take 650 mg by mouth daily as needed for headache (pain).     albuterol (PROVENTIL) (2.5 MG/3ML) 0.083% nebulizer solution Take 2.5 mg by nebulization 4 (four) times daily as needed for wheezing or shortness of breath.      allopurinol (ZYLOPRIM) 100 MG tablet Take 1 tablet (100 mg total) by mouth at bedtime.     amiodarone (PACERONE) 200 MG tablet Take 1 tablet (200 mg total) by mouth in the morning.     apixaban (ELIQUIS) 2.5 MG TABS tablet Take 2.5 mg by mouth 2 (two) times daily.     atorvastatin (LIPITOR) 40 MG tablet Take 40 mg by mouth daily in the afternoon. In the afternoon     Biotin 5000 MCG TABS Take 5,000 mcg by mouth in the morning.     Camphor-Menthol-Methyl Sal (SALONPAS) 3.08-10-08 % PTCH Place 1 patch onto the skin daily as needed (pain).     Carboxymethylcellul-Glycerin (CLEAR EYES FOR DRY EYES) 1-0.25 % SOLN Place 1 drop into both eyes daily as needed (dry eys).     Cholecalciferol (VITAMIN D3) 50 MCG (2000 UT) TABS Take 2,000 Units by mouth in the morning.     esomeprazole (NEXIUM) 40 MG capsule Take  1 capsule (40 mg total) by mouth 2 (two) times daily before a meal. 60 capsule 0   famotidine (PEPCID) 20 MG tablet Take 20 mg by mouth every evening.     ferric citrate (AURYXIA) 1 GM 210 MG(Fe) tablet Take 210 mg  by mouth 3 (three) times daily with meals.     fluticasone (FLONASE) 50 MCG/ACT nasal spray Place 1 spray into both nostrils daily.     Fluticasone-Umeclidin-Vilant (TRELEGY ELLIPTA) 100-62.5-25 MCG/INH AEPB Inhale 1 puff into the lungs daily at 12 noon.     gabapentin (NEURONTIN) 100 MG capsule Take 100 mg by mouth at bedtime.     iron sucrose in sodium chloride 0.9 % 100 mL Iron Sucrose (Venofer)     midodrine (PROAMATINE) 10 MG tablet Take 1 tablet (10 mg total) by mouth 3 (three) times daily with meals. 90 tablet 0   nitroGLYCERIN (NITROSTAT) 0.4 MG SL tablet Place 0.4 mg under the tongue every 5 (five) minutes x 3 doses as needed for chest pain.     ondansetron (ZOFRAN) 4 MG tablet Take 4 mg by mouth every 8 (eight) hours as needed for nausea/vomiting, nausea or vomiting.     OXYGEN Inhale 2 L into the lungs See admin instructions. Use every night and as needed during the day     PRESCRIPTION MEDICATION Inhale into the lungs at bedtime. Bipap     promethazine (PHENERGAN) 12.5 MG tablet Take 1 tablet (12.5 mg total) by mouth every 8 (eight) hours as needed for nausea or vomiting. 30 tablet 2   sucralfate (CARAFATE) 1 GM/10ML suspension Take 10 mLs (1 g total) by mouth 2 (two) times daily for 14 days. 280 mL 0   No current facility-administered medications for this visit.    No Known Allergies   REVIEW OF SYSTEMS:   [X]  denotes positive finding, [ ]  denotes negative finding Cardiac  Comments:  Chest pain or chest pressure:    Shortness of breath upon exertion:    Short of breath when lying flat:    Irregular heart rhythm:        Vascular    Pain in calf, thigh, or hip brought on by ambulation:    Pain in feet at night that wakes you up from your sleep:     Blood clot in your veins:    Leg swelling:         Pulmonary    Oxygen at home:    Productive cough:     Wheezing:         Neurologic    Sudden weakness in arms or legs:     Sudden numbness in arms or legs:     Sudden  onset of difficulty speaking or slurred speech:    Temporary loss of vision in one eye:     Problems with dizziness:         Gastrointestinal    Blood in stool:     Vomited blood:         Genitourinary    Burning when urinating:     Blood in urine:        Psychiatric    Major depression:         Hematologic    Bleeding problems:    Problems with blood clotting too easily:        Skin    Rashes or ulcers:        Constitutional    Fever or chills:  PHYSICAL EXAMINATION:  Vitals:   04/02/21 1340  BP: (!) 96/35  Pulse: 63  Resp: 20  Temp: (!) 97.4 F (36.3 C)  TempSrc: Temporal  SpO2: 98%  Weight: 159 lb (72.1 kg)  Height: 4\' 8"  (1.422 m)    General:  WDWN in NAD; vital signs documented above Gait: Not observed HENT: WNL, normocephalic Pulmonary: normal non-labored breathing , without Rales, rhonchi,  wheezing Cardiac: regular HR Abdomen: soft, NT, no masses Skin: without rashes Vascular Exam/Pulses:  Right Left  Radial 2+ (normal) 2+ (normal)   Extremities: Palpable thrill near the anastomosis of right basilic vein fistula which becomes more pulsatile as he move toward the axilla; all incisions of right arm are healed Musculoskeletal: no muscle wasting or atrophy  Neurologic: A&O X 3;  No focal weakness or paresthesias are detected Psychiatric:  The pt has Normal affect.   Non-Invasive Vascular Imaging:   Diminished flow volume right basilic vein fistula with elevated velocity in the distal upper arm    ASSESSMENT/PLAN:: 75 y.o. female here for evaluation of right basilic vein fistula  -Right basilic vein fistula is patent however based on physical exam as well as duplex there is a suggestion of significant stenosis in the distal upper arm -I recommended proceeding with fistulogram for intervention of high-grade stenosis versus balloon assisted maturation -Given the patient's recent history of dialysis access surgery she is understandably upset that  her latest access is not ready for use.  She elected to not proceed with fistulogram at this time.  She is also contemplating being catheter dependent.  She will discuss her options with her family and will call back to either schedule fistulogram or consider herself catheter dependent.  I also made the patient aware that it is possible that the fistula could thrombose if there is a significant lesion consistent with duplex and physical exam findings   Dagoberto Ligas, PA-C [facility] [phone]  Clinic MD:   Trula Slade

## 2021-04-11 ENCOUNTER — Ambulatory Visit: Payer: Medicare Other | Admitting: Gastroenterology

## 2021-04-13 ENCOUNTER — Inpatient Hospital Stay (HOSPITAL_COMMUNITY)
Admission: EM | Admit: 2021-04-13 | Discharge: 2021-04-21 | DRG: 391 | Disposition: A | Discharge status: Skilled Nursing Facility | Payer: Medicare Other | Attending: Family Medicine | Admitting: Family Medicine

## 2021-04-13 ENCOUNTER — Other Ambulatory Visit: Payer: Self-pay

## 2021-04-13 ENCOUNTER — Emergency Department (HOSPITAL_COMMUNITY): Payer: Medicare Other

## 2021-04-13 ENCOUNTER — Encounter (HOSPITAL_COMMUNITY): Payer: Self-pay | Admitting: *Deleted

## 2021-04-13 DIAGNOSIS — R1084 Generalized abdominal pain: Secondary | ICD-10-CM | POA: Diagnosis present

## 2021-04-13 DIAGNOSIS — N2581 Secondary hyperparathyroidism of renal origin: Secondary | ICD-10-CM | POA: Diagnosis present

## 2021-04-13 DIAGNOSIS — I480 Paroxysmal atrial fibrillation: Secondary | ICD-10-CM | POA: Diagnosis present

## 2021-04-13 DIAGNOSIS — I9589 Other hypotension: Secondary | ICD-10-CM | POA: Diagnosis present

## 2021-04-13 DIAGNOSIS — Z7901 Long term (current) use of anticoagulants: Secondary | ICD-10-CM

## 2021-04-13 DIAGNOSIS — Z992 Dependence on renal dialysis: Secondary | ICD-10-CM

## 2021-04-13 DIAGNOSIS — K2960 Other gastritis without bleeding: Secondary | ICD-10-CM | POA: Diagnosis present

## 2021-04-13 DIAGNOSIS — Z79899 Other long term (current) drug therapy: Secondary | ICD-10-CM

## 2021-04-13 DIAGNOSIS — Z9049 Acquired absence of other specified parts of digestive tract: Secondary | ICD-10-CM

## 2021-04-13 DIAGNOSIS — I10 Essential (primary) hypertension: Secondary | ICD-10-CM | POA: Diagnosis present

## 2021-04-13 DIAGNOSIS — J449 Chronic obstructive pulmonary disease, unspecified: Secondary | ICD-10-CM | POA: Diagnosis present

## 2021-04-13 DIAGNOSIS — E66813 Obesity, class 3: Secondary | ICD-10-CM | POA: Diagnosis present

## 2021-04-13 DIAGNOSIS — Z90711 Acquired absence of uterus with remaining cervical stump: Secondary | ICD-10-CM

## 2021-04-13 DIAGNOSIS — I2510 Atherosclerotic heart disease of native coronary artery without angina pectoris: Secondary | ICD-10-CM | POA: Diagnosis present

## 2021-04-13 DIAGNOSIS — Z7951 Long term (current) use of inhaled steroids: Secondary | ICD-10-CM

## 2021-04-13 DIAGNOSIS — Z841 Family history of disorders of kidney and ureter: Secondary | ICD-10-CM

## 2021-04-13 DIAGNOSIS — E875 Hyperkalemia: Secondary | ICD-10-CM | POA: Diagnosis present

## 2021-04-13 DIAGNOSIS — Z86010 Personal history of colonic polyps: Secondary | ICD-10-CM

## 2021-04-13 DIAGNOSIS — Z833 Family history of diabetes mellitus: Secondary | ICD-10-CM

## 2021-04-13 DIAGNOSIS — M1A30X0 Chronic gout due to renal impairment, unspecified site, without tophus (tophi): Secondary | ICD-10-CM | POA: Diagnosis present

## 2021-04-13 DIAGNOSIS — Z8541 Personal history of malignant neoplasm of cervix uteri: Secondary | ICD-10-CM

## 2021-04-13 DIAGNOSIS — N186 End stage renal disease: Secondary | ICD-10-CM

## 2021-04-13 DIAGNOSIS — K2100 Gastro-esophageal reflux disease with esophagitis, without bleeding: Secondary | ICD-10-CM

## 2021-04-13 DIAGNOSIS — G4733 Obstructive sleep apnea (adult) (pediatric): Secondary | ICD-10-CM | POA: Diagnosis present

## 2021-04-13 DIAGNOSIS — I132 Hypertensive heart and chronic kidney disease with heart failure and with stage 5 chronic kidney disease, or end stage renal disease: Secondary | ICD-10-CM | POA: Diagnosis present

## 2021-04-13 DIAGNOSIS — E1122 Type 2 diabetes mellitus with diabetic chronic kidney disease: Secondary | ICD-10-CM | POA: Diagnosis present

## 2021-04-13 DIAGNOSIS — E669 Obesity, unspecified: Secondary | ICD-10-CM | POA: Diagnosis present

## 2021-04-13 DIAGNOSIS — K5732 Diverticulitis of large intestine without perforation or abscess without bleeding: Principal | ICD-10-CM | POA: Diagnosis present

## 2021-04-13 DIAGNOSIS — Z9981 Dependence on supplemental oxygen: Secondary | ICD-10-CM

## 2021-04-13 DIAGNOSIS — K5792 Diverticulitis of intestine, part unspecified, without perforation or abscess without bleeding: Secondary | ICD-10-CM | POA: Diagnosis present

## 2021-04-13 DIAGNOSIS — Z8249 Family history of ischemic heart disease and other diseases of the circulatory system: Secondary | ICD-10-CM

## 2021-04-13 DIAGNOSIS — D631 Anemia in chronic kidney disease: Secondary | ICD-10-CM | POA: Diagnosis present

## 2021-04-13 DIAGNOSIS — R109 Unspecified abdominal pain: Secondary | ICD-10-CM

## 2021-04-13 DIAGNOSIS — I5032 Chronic diastolic (congestive) heart failure: Secondary | ICD-10-CM | POA: Diagnosis not present

## 2021-04-13 DIAGNOSIS — D500 Iron deficiency anemia secondary to blood loss (chronic): Secondary | ICD-10-CM | POA: Diagnosis present

## 2021-04-13 DIAGNOSIS — G8929 Other chronic pain: Secondary | ICD-10-CM | POA: Diagnosis present

## 2021-04-13 DIAGNOSIS — I130 Hypertensive heart and chronic kidney disease with heart failure and stage 1 through stage 4 chronic kidney disease, or unspecified chronic kidney disease: Secondary | ICD-10-CM | POA: Diagnosis present

## 2021-04-13 DIAGNOSIS — K449 Diaphragmatic hernia without obstruction or gangrene: Secondary | ICD-10-CM | POA: Diagnosis present

## 2021-04-13 DIAGNOSIS — R778 Other specified abnormalities of plasma proteins: Secondary | ICD-10-CM

## 2021-04-13 DIAGNOSIS — Z825 Family history of asthma and other chronic lower respiratory diseases: Secondary | ICD-10-CM

## 2021-04-13 DIAGNOSIS — R112 Nausea with vomiting, unspecified: Secondary | ICD-10-CM | POA: Diagnosis present

## 2021-04-13 DIAGNOSIS — I808 Phlebitis and thrombophlebitis of other sites: Secondary | ICD-10-CM | POA: Diagnosis not present

## 2021-04-13 DIAGNOSIS — N1832 Hypertensive heart and chronic kidney disease with heart failure and stage 1 through stage 4 chronic kidney disease, or unspecified chronic kidney disease: Secondary | ICD-10-CM | POA: Diagnosis present

## 2021-04-13 DIAGNOSIS — E782 Mixed hyperlipidemia: Secondary | ICD-10-CM | POA: Diagnosis present

## 2021-04-13 DIAGNOSIS — M810 Age-related osteoporosis without current pathological fracture: Secondary | ICD-10-CM | POA: Diagnosis present

## 2021-04-13 DIAGNOSIS — Z20822 Contact with and (suspected) exposure to covid-19: Secondary | ICD-10-CM | POA: Diagnosis present

## 2021-04-13 DIAGNOSIS — E1142 Type 2 diabetes mellitus with diabetic polyneuropathy: Secondary | ICD-10-CM | POA: Diagnosis present

## 2021-04-13 DIAGNOSIS — Z87891 Personal history of nicotine dependence: Secondary | ICD-10-CM

## 2021-04-13 DIAGNOSIS — Z6832 Body mass index (BMI) 32.0-32.9, adult: Secondary | ICD-10-CM

## 2021-04-13 DIAGNOSIS — E785 Hyperlipidemia, unspecified: Secondary | ICD-10-CM | POA: Diagnosis present

## 2021-04-13 LAB — COMPREHENSIVE METABOLIC PANEL
ALT: 10 U/L (ref 0–44)
AST: 14 U/L — ABNORMAL LOW (ref 15–41)
Albumin: 2 g/dL — ABNORMAL LOW (ref 3.5–5.0)
Alkaline Phosphatase: 84 U/L (ref 38–126)
Anion gap: 16 — ABNORMAL HIGH (ref 5–15)
BUN: 42 mg/dL — ABNORMAL HIGH (ref 8–23)
CO2: 22 mmol/L (ref 22–32)
Calcium: 8.4 mg/dL — ABNORMAL LOW (ref 8.9–10.3)
Chloride: 98 mmol/L (ref 98–111)
Creatinine, Ser: 10.12 mg/dL — ABNORMAL HIGH (ref 0.44–1.00)
GFR, Estimated: 4 mL/min — ABNORMAL LOW (ref >60)
Glucose, Bld: 83 mg/dL (ref 70–99)
Potassium: 6.1 mmol/L — ABNORMAL HIGH (ref 3.5–5.1)
Sodium: 136 mmol/L (ref 135–145)
Total Bilirubin: 0.8 mg/dL (ref 0.3–1.2)
Total Protein: 5.9 g/dL — ABNORMAL LOW (ref 6.5–8.1)

## 2021-04-13 LAB — RESP PANEL BY RT-PCR (FLU A&B, COVID) ARPGX2
Influenza A by PCR: NEGATIVE
Influenza B by PCR: NEGATIVE
SARS Coronavirus 2 by RT PCR: NEGATIVE

## 2021-04-13 LAB — CBC WITH DIFFERENTIAL/PLATELET
Abs Immature Granulocytes: 0.41 10*3/uL — ABNORMAL HIGH (ref 0.00–0.07)
Basophils Absolute: 0.1 10*3/uL (ref 0.0–0.1)
Basophils Relative: 0 %
Eosinophils Absolute: 0.1 10*3/uL (ref 0.0–0.5)
Eosinophils Relative: 1 %
HCT: 31.8 % — ABNORMAL LOW (ref 36.0–46.0)
Hemoglobin: 10.3 g/dL — ABNORMAL LOW (ref 12.0–15.0)
Immature Granulocytes: 3 %
Lymphocytes Relative: 5 %
Lymphs Abs: 0.8 10*3/uL (ref 0.7–4.0)
MCH: 28.9 pg (ref 26.0–34.0)
MCHC: 32.4 g/dL (ref 30.0–36.0)
MCV: 89.1 fL (ref 80.0–100.0)
Monocytes Absolute: 1 10*3/uL (ref 0.1–1.0)
Monocytes Relative: 7 %
Neutro Abs: 13 10*3/uL — ABNORMAL HIGH (ref 1.7–7.7)
Neutrophils Relative %: 84 %
Platelets: 279 10*3/uL (ref 150–400)
RBC: 3.57 MIL/uL — ABNORMAL LOW (ref 3.87–5.11)
RDW: 15.5 % (ref 11.5–15.5)
WBC: 15.3 10*3/uL — ABNORMAL HIGH (ref 4.0–10.5)
nRBC: 0 % (ref 0.0–0.2)

## 2021-04-13 LAB — LACTIC ACID, PLASMA
Lactic Acid, Venous: 1.9 mmol/L (ref 0.5–1.9)
Lactic Acid, Venous: 2.1 mmol/L (ref 0.5–1.9)

## 2021-04-13 LAB — LIPASE, BLOOD: Lipase: 29 U/L (ref 11–51)

## 2021-04-13 LAB — I-STAT CHEM 8, ED
BUN: 39 mg/dL — ABNORMAL HIGH (ref 8–23)
Calcium, Ion: 0.98 mmol/L — ABNORMAL LOW (ref 1.15–1.40)
Chloride: 104 mmol/L (ref 98–111)
Creatinine, Ser: 10.3 mg/dL — ABNORMAL HIGH (ref 0.44–1.00)
Glucose, Bld: 74 mg/dL (ref 70–99)
HCT: 32 % — ABNORMAL LOW (ref 36.0–46.0)
Hemoglobin: 10.9 g/dL — ABNORMAL LOW (ref 12.0–15.0)
Potassium: 6.2 mmol/L — ABNORMAL HIGH (ref 3.5–5.1)
Sodium: 135 mmol/L (ref 135–145)
TCO2: 26 mmol/L (ref 22–32)

## 2021-04-13 LAB — PROTIME-INR
INR: 1.1 (ref 0.8–1.2)
Prothrombin Time: 13.8 seconds (ref 11.4–15.2)

## 2021-04-13 LAB — TROPONIN I (HIGH SENSITIVITY)
Troponin I (High Sensitivity): 21 ng/L — ABNORMAL HIGH (ref <18)
Troponin I (High Sensitivity): 20 ng/L — ABNORMAL HIGH (ref <18)

## 2021-04-13 MED ORDER — PANTOPRAZOLE SODIUM 40 MG IV SOLR
40.0000 mg | Freq: Every day | INTRAVENOUS | Status: DC
  Administered 2021-04-14 – 2021-04-16 (×4): 40 mg via INTRAVENOUS
  Filled 2021-04-13 (×4): qty 40

## 2021-04-13 MED ORDER — MIDODRINE HCL 5 MG PO TABS
5.0000 mg | ORAL_TABLET | Freq: Once | ORAL | Status: AC
  Administered 2021-04-13: 5 mg via ORAL
  Filled 2021-04-13: qty 1

## 2021-04-13 MED ORDER — SODIUM CHLORIDE 0.9 % IV SOLN
2.0000 g | Freq: Once | INTRAVENOUS | Status: AC
  Administered 2021-04-13: 2 g via INTRAVENOUS
  Filled 2021-04-13: qty 20

## 2021-04-13 MED ORDER — ONDANSETRON HCL 4 MG PO TABS: 4.0000 mg | ORAL_TABLET | Freq: Four times a day (QID) | ORAL | Status: DC | PRN

## 2021-04-13 MED ORDER — APIXABAN 2.5 MG PO TABS
2.5000 mg | ORAL_TABLET | Freq: Two times a day (BID) | ORAL | Status: DC
  Administered 2021-04-14 – 2021-04-21 (×17): 2.5 mg via ORAL
  Filled 2021-04-13 (×18): qty 1

## 2021-04-13 MED ORDER — METRONIDAZOLE 500 MG/100ML IV SOLN
500.0000 mg | Freq: Once | INTRAVENOUS | Status: AC
  Administered 2021-04-14: 500 mg via INTRAVENOUS
  Filled 2021-04-13: qty 100

## 2021-04-13 MED ORDER — MIDODRINE HCL 5 MG PO TABS
10.0000 mg | ORAL_TABLET | Freq: Three times a day (TID) | ORAL | Status: DC
  Administered 2021-04-14 – 2021-04-21 (×23): 10 mg via ORAL
  Filled 2021-04-13 (×24): qty 2

## 2021-04-13 MED ORDER — FLUTICASONE-UMECLIDIN-VILANT 100-62.5-25 MCG/INH IN AEPB: 1.0000 | INHALATION_SPRAY | Freq: Every day | RESPIRATORY_TRACT | Status: DC

## 2021-04-13 MED ORDER — ONDANSETRON HCL 4 MG/2ML IJ SOLN
4.0000 mg | Freq: Once | INTRAMUSCULAR | Status: AC
  Administered 2021-04-13: 4 mg via INTRAVENOUS
  Filled 2021-04-13: qty 2

## 2021-04-13 MED ORDER — AMIODARONE HCL 200 MG PO TABS
200.0000 mg | ORAL_TABLET | Freq: Every morning | ORAL | Status: DC
  Administered 2021-04-14 – 2021-04-21 (×8): 200 mg via ORAL
  Filled 2021-04-13 (×8): qty 1

## 2021-04-13 MED ORDER — POLYVINYL ALCOHOL 1.4 % OP SOLN: 1.0000 [drp] | Freq: Every day | OPHTHALMIC | Status: DC | PRN

## 2021-04-13 MED ORDER — MUSCLE RUB 10-15 % EX CREA: 1.0000 "application " | TOPICAL_CREAM | Freq: Every day | CUTANEOUS | Status: DC | PRN

## 2021-04-13 MED ORDER — SUCRALFATE 1 GM/10ML PO SUSP
1.0000 g | Freq: Two times a day (BID) | ORAL | Status: DC
  Administered 2021-04-14 – 2021-04-17 (×7): 1 g via ORAL
  Filled 2021-04-13 (×8): qty 10

## 2021-04-13 MED ORDER — VITAMIN D 25 MCG (1000 UNIT) PO TABS
2000.0000 [IU] | ORAL_TABLET | Freq: Every day | ORAL | Status: DC
  Administered 2021-04-14 – 2021-04-17 (×4): 2000 [IU] via ORAL
  Filled 2021-04-13 (×4): qty 2

## 2021-04-13 MED ORDER — SODIUM CHLORIDE 0.9 % IV SOLN: 250.0000 mL | INTRAVENOUS | Status: DC | PRN

## 2021-04-13 MED ORDER — FAMOTIDINE IN NACL 20-0.9 MG/50ML-% IV SOLN
20.0000 mg | Freq: Once | INTRAVENOUS | Status: AC
  Administered 2021-04-13: 20 mg via INTRAVENOUS
  Filled 2021-04-13: qty 50

## 2021-04-13 MED ORDER — GABAPENTIN 100 MG PO CAPS
100.0000 mg | ORAL_CAPSULE | Freq: Every day | ORAL | Status: DC
  Administered 2021-04-14 – 2021-04-16 (×4): 100 mg via ORAL
  Filled 2021-04-13 (×4): qty 1

## 2021-04-13 MED ORDER — PANTOPRAZOLE SODIUM 40 MG PO TBEC: 40.0000 mg | DELAYED_RELEASE_TABLET | Freq: Every day | ORAL | Status: DC

## 2021-04-13 MED ORDER — FERRIC CITRATE 1 GM 210 MG(FE) PO TABS
210.0000 mg | ORAL_TABLET | Freq: Three times a day (TID) | ORAL | Status: DC
  Administered 2021-04-14 – 2021-04-17 (×10): 210 mg via ORAL
  Filled 2021-04-13 (×13): qty 1

## 2021-04-13 MED ORDER — SODIUM CHLORIDE 0.9% FLUSH
3.0000 mL | Freq: Two times a day (BID) | INTRAVENOUS | Status: DC
  Administered 2021-04-14 – 2021-04-20 (×15): 3 mL via INTRAVENOUS

## 2021-04-13 MED ORDER — ACETAMINOPHEN 325 MG PO TABS
650.0000 mg | ORAL_TABLET | Freq: Every day | ORAL | Status: DC | PRN
  Administered 2021-04-14 – 2021-04-17 (×3): 650 mg via ORAL
  Filled 2021-04-13 (×3): qty 2

## 2021-04-13 MED ORDER — ALLOPURINOL 100 MG PO TABS
100.0000 mg | ORAL_TABLET | Freq: Every day | ORAL | Status: DC
  Administered 2021-04-14 – 2021-04-16 (×4): 100 mg via ORAL
  Filled 2021-04-13 (×4): qty 1

## 2021-04-13 MED ORDER — SODIUM CHLORIDE 0.9% FLUSH: 3.0000 mL | INTRAVENOUS | Status: DC | PRN

## 2021-04-13 MED ORDER — ATORVASTATIN CALCIUM 40 MG PO TABS
40.0000 mg | ORAL_TABLET | Freq: Every day | ORAL | Status: DC
  Administered 2021-04-14 – 2021-04-16 (×4): 40 mg via ORAL
  Filled 2021-04-13 (×4): qty 1

## 2021-04-13 MED ORDER — FLUTICASONE FUROATE-VILANTEROL 100-25 MCG/INH IN AEPB
1.0000 | INHALATION_SPRAY | Freq: Every day | RESPIRATORY_TRACT | Status: DC
  Administered 2021-04-14 – 2021-04-20 (×6): 1 via RESPIRATORY_TRACT
  Filled 2021-04-13: qty 28

## 2021-04-13 MED ORDER — ONDANSETRON HCL 4 MG/2ML IJ SOLN
4.0000 mg | Freq: Four times a day (QID) | INTRAMUSCULAR | Status: DC | PRN
  Administered 2021-04-16 – 2021-04-17 (×4): 4 mg via INTRAVENOUS
  Filled 2021-04-13 (×5): qty 2

## 2021-04-13 MED ORDER — UMECLIDINIUM BROMIDE 62.5 MCG/INH IN AEPB
1.0000 | INHALATION_SPRAY | Freq: Every day | RESPIRATORY_TRACT | Status: DC
  Administered 2021-04-14 – 2021-04-20 (×6): 1 via RESPIRATORY_TRACT
  Filled 2021-04-13: qty 7

## 2021-04-13 MED ORDER — SODIUM ZIRCONIUM CYCLOSILICATE 10 G PO PACK
10.0000 g | PACK | Freq: Once | ORAL | Status: AC
  Administered 2021-04-13: 10 g via ORAL
  Filled 2021-04-13: qty 1

## 2021-04-13 MED ORDER — NITROGLYCERIN 0.4 MG SL SUBL: 0.4000 mg | SUBLINGUAL_TABLET | SUBLINGUAL | Status: DC | PRN

## 2021-04-13 MED ORDER — BIOTIN 5000 MCG PO TABS: 5000.0000 ug | ORAL_TABLET | Freq: Every morning | ORAL | Status: DC

## 2021-04-13 NOTE — ED Notes (Signed)
This RN introduced self to patient. Patient given warm blankets. Patient resting in stretcher comfortably. Denies any needs at this time. Call bell within reach. Stretcher in low and locked position. Side rails up x2.

## 2021-04-13 NOTE — ED Notes (Signed)
IV team at the bedside. 

## 2021-04-13 NOTE — ED Triage Notes (Signed)
The pt arrived by [facility] from home.  The pt has missed her last two dialysis appointments  she has had nausea vomiting  and lower abd pain for 6 days history of the same  she has been taking phenergan and immodium  ems reports that the pt is just spitting not vomiting

## 2021-04-13 NOTE — ED Notes (Signed)
Called main lab and stated that they will run a troponin on initial labs sent down.

## 2021-04-13 NOTE — ED Notes (Signed)
IV team is still at the bedside trying to get an IV

## 2021-04-13 NOTE — ED Provider Notes (Signed)
[place] [facility] EMERGENCY DEPARTMENT Provider Note   CSN: [id number] Arrival date & time: 04/13/21  1531     History Chief Complaint  Patient presents with  . Abdominal Pain    [name] is a 75 y.o. female.  75 year old female with history of end-stage renal disease on hemodialysis, dialysis Tuesday Thursday Saturday.  Presents the ER secondary to ongoing abdominal pain, nausea, emesis (yesterday).  Patient was hospitalized last month with similar complaint.  She was started on Augmentin for presumed diverticulitis.  Also was found to have gastritis.  Esophagitis.  She was discharged in stable condition with outpatient GI follow-up.  She reports her symptoms have been ongoing since she was discharged in the hospital.  Abdominal discomfort is generalized, somewhat localized to the right lower quadrant.  Described as sharp, aching, cramping.  Pt with nausea/emesis. Difficulty taking her daily medications. Last emesis just prior to arrival.   Patient has not been to dialysis since last Saturday   Mild dyspnea at baseline which is unchanged.  She is also anticoagulated for paroxysmal atrial fibrillation     The history is provided by the patient. No language interpreter was used.  Abdominal Pain Pain location:  Generalized Pain quality: aching, cramping and sharp   Pain radiates to:  Does not radiate Pain severity:  Mild Onset quality:  Gradual Duration:  4 weeks Associated symptoms: fatigue, nausea and vomiting   Associated symptoms: no chest pain, no chills, no cough, no fever, no hematuria and no shortness of breath       Past Medical History:  Diagnosis Date  . Blood transfusion without reported diagnosis   . CAD (coronary artery disease)    Nonobstructive at cardiac catheterization 2000  . Cataract   . Cervical cancer ([place]) 1978  . CHF (congestive heart failure) ([place])   . Chronic back pain   . Chronic kidney disease   . Class 2 obesity with body  mass index (BMI) of 35 to 39.9 without comorbidity   . Complication of anesthesia    hard to awaken with one back surgery - years ago, no problem since  . COPD (chronic obstructive pulmonary disease) ([place])   . Degenerative disc disease   . DM (diabetes mellitus), type 2 with renal complications ([place])   . Dyslipidemia   . Dyspnea    "due to COPD"  . Dysrhythmia    a-fib  . ESRD (end stage renal disease) on dialysis [facility])    [place]  . GERD (gastroesophageal reflux disease)   . Gout   . Hiatal hernia 07/27/2013  . History of diverticulitis of colon   . History of hiatal hernia   . HTN (hypertension)    09/09/20 not currently on medication, is on medication for hypotension  . Hypotension   . Iron deficiency anemia   . Irritable bowel syndrome   . Lumbar radiculopathy   . Mixed hyperlipidemia   . Moderate major depression, single episode ([place]) 07/21/2019  . Neuropathy   . OSA (obstructive sleep apnea)   . Osteoporosis   . Ovarian cancer [facility]) 1978   patient denies. States this was cervical cancer  . Oxygen deficiency    room air  . PAF (paroxysmal atrial fibrillation) ([place])   . Pneumonia   . Sleep apnea    bipap - oxygen 2 l to bipap.  . Type 2 diabetes mellitus ([place])   . Vitamin B deficiency 12/25/2009  . Vitamin B12 deficiency  Patient Active Problem List   Diagnosis Date Noted  . Diverticulitis   . Non-intractable vomiting   . GI bleed 02/25/2021  . Acute blood loss anemia 02/25/2021  . Hypotension 02/25/2021  . Anticoagulated   . Presence of cardiac and vascular implant and graft, unspecified 11/14/2020  . Diverticulitis of colon without hemorrhage 09/08/2020  . Other disorders of phosphorus metabolism 08/31/2020  . Acute respiratory failure due to COVID-19 ([place]) 07/20/2020  . Acute respiratory failure with hypoxia ([place]) 07/20/2020  . Allergy, unspecified, sequela 06/28/2020  . Personal history of anaphylaxis 06/28/2020  . Recurrent falls  04/25/2020  . Fall 04/24/2020  . ESRD (end stage renal disease) on dialysis ([place])   . Essential hypertension   . Dyslipidemia   . DM (diabetes mellitus), type 2 with renal complications ([place])   . Class 2 obesity with body mass index (BMI) of 35 to 39.9 without comorbidity   . OSA (obstructive sleep apnea)   . COPD (chronic obstructive pulmonary disease) ([place])   . PAF (paroxysmal atrial fibrillation) ([place])   . Acute respiratory distress 03/09/2020  . Fluid overload, unspecified 03/09/2020  . Hyperkalemia 03/06/2020  . GERD with esophagitis 03/06/2020  . Acute bacterial bronchitis 03/06/2020  . Anemia in chronic kidney disease 11/17/2019  . Coagulation defect, unspecified ([place]) 11/17/2019  . Pain, unspecified 11/17/2019  . Pruritus, unspecified 11/17/2019  . Secondary hyperparathyroidism of renal origin ([place]) 11/17/2019  . Unspecified protein-calorie malnutrition ([place]) 11/17/2019  . Loss of weight 10/22/2019  . Goals of care, counseling/discussion 07/23/2019  . 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 07/21/2019  . Paroxysmal atrial fibrillation ([place]) 07/21/2019  . Encephalopathy 07/21/2019  . Moderate major depression, single episode ([place]) 07/21/2019  . Pressure ulcer 07/20/2019  . Anorexia 07/10/2019  . Weight gain with edema 07/02/2019  . 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 Neuropathy due to type 2 diabetes mellitus ([place]) 06/22/2019  . Mixed diabetic hyperlipidemia associated with type 2 diabetes mellitus ([place]) 06/22/2019  . Chronic gout 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 06/22/2019  . Quality of life palliative care encounter 06/19/2019  . Atrial fibrillation with RVR ([place])   . Atrial flutter with rapid ventricular response ([place]) 06/16/2019  . Dyspnea and respiratory abnormalities 06/01/2019  . Acute on chronic respiratory failure with hypoxia and hypercapnia with  respiratory acidosis 06/01/2019  . Anasarca 05/31/2019  . Nausea without vomiting 05/31/2019  . Generalized abdominal pain 05/31/2019  . History of colonic polyps 05/31/2019  . Lymphedema of both lower extremities 04/28/2019  . Cellulitis of finger of right hand 08/19/2018  . Chronic respiratory failure with hypoxia ([place]) 07/13/2018  . Acute renal failure superimposed on stage 3 chronic kidney disease ([place]) 07/13/2018  . Chronic diastolic CHF (congestive heart failure) ([place]) 07/13/2018  . COPD (chronic obstructive pulmonary disease) ([place]) 07/13/2018  . Flatulence 09/18/2017  . IBS (irritable bowel syndrome) 04/03/2017  . Acute on chronic diastolic CHF (congestive heart failure) ([place]) 05/08/2016  . COPD with acute exacerbation ([place]) 05/08/2016  . Constipation 04/25/2016  . Peripheral vertigo 06/27/2015  . Intractable nausea and vomiting 03/29/2015  . Port-A-Cath in place 10/06/2014  . Chronic obstructive pulmonary disease ([place]) 04/30/2014  . Obesity, Class III, BMI 40-49.9 (morbid obesity) ([place]) 04/30/2014  . Hemorrhoids, internal, with bleeding 11/10/2013  . Symptomatic anemia 08/18/2013  . Other diseases of tongue 08/18/2013  . Dark stools 07/27/2013  . Chest pain 09/05/2012  . Arthropathy 12/09/2011  .  Edema 12/09/2011  . Gout 12/09/2011  . Hiatal hernia with GERD and esophagitis   . Obstructive chronic bronchitis without exacerbation ([place]) 12/26/2009  . Hematochezia 12/26/2009  . Dysphagia 12/26/2009  . DM type 2 with diabetic peripheral neuropathy ([place]) 12/25/2009  . Vitamin B deficiency 12/25/2009  . Iron deficiency anemia due to chronic blood loss 12/25/2009  . Benign essential HTN 12/25/2009  . DEGENERATIVE DISC DISEASE 12/25/2009  . OSA (obstructive sleep apnea) 12/25/2009  . HLD (hyperlipidemia) 12/25/2009  . Type 2 diabetes mellitus with ESRD (end-stage renal disease) ([place]) 12/25/2009  . Narrowing of intervertebral disc space 12/25/2009    Past Surgical History:   Procedure Laterality Date  . ABDOMINAL HYSTERECTOMY    . APPLICATION OF WOUND VAC Right 01/15/2021   Procedure: APPLICATION OF WOUND VAC;  Surgeon: Cherre Robins, MD;  Location: [place];  Service: Vascular;  Laterality: Right;  . AV FISTULA PLACEMENT Left 08/05/2019   Procedure: ARTERIOVENOUS (AV) FISTULA CREATION LEFT ARM;  Surgeon: Waynetta Sandy, MD;  Location: [place];  Service: Vascular;  Laterality: Left;  . AV FISTULA PLACEMENT Left 09/11/2020   Procedure: INSERTION OF ARTERIOVENOUS (AV) GORE-TEX GRAFT ARM LEFT;  Surgeon: Cherre Robins, MD;  Location: [facility] OR;  Service: Vascular;  Laterality: Left;  . AV FISTULA PLACEMENT Right 11/29/2020   Procedure: Creation of Arteriovenous Fistula Right Upper Arm;  Surgeon: Rosetta Posner, MD;  Location: [place];  Service: Vascular;  Laterality: Right;  . BACK SURGERY    . BASCILIC VEIN TRANSPOSITION Left 10/05/2019   Procedure: SECOND STAGE LEFT BASCILIC VEIN TRANSPOSITION;  Surgeon: Waynetta Sandy, MD;  Location: [place];  Service: Vascular;  Laterality: Left;  . BASCILIC VEIN TRANSPOSITION Right 01/15/2021   Procedure: RIGHT UPPER ARM SECOND STAGE BASCILIC VEIN TRANSPOSITION;  Surgeon: Cherre Robins, MD;  Location: [facility];  Service: Vascular;  Laterality: Right;  PERIPHERAL NERVE BLOCK  . Benign breast cysts    . BIOPSY  02/26/2021   Procedure: BIOPSY;  Surgeon: Jackquline Denmark, MD;  Location: [facility] ENDOSCOPY;  Service: Endoscopy;;  . CHOLECYSTECTOMY    . COLONOSCOPY  10/01/2006   SLF:Pan colonic diverticulosis and moderate internal hemorrhoids/ Otherwise no polyps, masses, inflammatory changes or AVMs/  . COLONOSCOPY  2011   SLF: pancolonic diverticulosis, large internal hemorrhoids  . COLONOSCOPY N/A 01/26/2016   Procedure: COLONOSCOPY;  Surgeon: [name], MD;  [facility];  Service: Endoscopy;  Laterality: N/A;  830   . COLONOSCOPY WITH PROPOFOL N/A 02/10/2020   Procedure: COLONOSCOPY WITH PROPOFOL;  Surgeon: Daneil Dolin, MD;  [facility];  Service: Endoscopy;  Laterality: N/A;  10:45am  . ESOPHAGEAL DILATION  02/26/2021   Procedure: ESOPHAGEAL DILATION;  Surgeon: Jackquline Denmark, MD;  Location: [facility] ENDOSCOPY;  Service: Endoscopy;;  . ESOPHAGOGASTRODUODENOSCOPY  11/19/2006   SLF: Large hiatal hernia without evidence of Cameron ulcers/. Distal esophageal stricture, which allowed the gastroscope to pass without resistance.  A 16 mm Savary later passed with mild resistance/ Normal stomach.sb bx negative  . ESOPHAGOGASTRODUODENOSCOPY  10/01/2006   UKG:URKYH hiatal hernia.  Distal esophagus without evidence of   erythema, ulceration or Barrett's esophagus  . ESOPHAGOGASTRODUODENOSCOPY  2011   SLF: large hh, distal esophageal web narrowing to 67mm s/p dilation to 51mm  . ESOPHAGOGASTRODUODENOSCOPY N/A 08/06/2013   SLF: 1. Stricture at the gastroesophageal junction 2. large hiatal hernia. 3. Mild erosive gastritis.  Marland Kitchen ESOPHAGOGASTRODUODENOSCOPY (EGD) WITH PROPOFOL N/A 07/19/2019   rourk: Mild erosive  reflux esophagitis.  Mild Schatzki ring status post dilation.  Large hiatal hernia with at least one half of the stomach above the diaphragm.  Gastric mucosa erythematous.  . ESOPHAGOGASTRODUODENOSCOPY (EGD) WITH PROPOFOL N/A 02/26/2021   Procedure: ESOPHAGOGASTRODUODENOSCOPY (EGD) WITH PROPOFOL;  Surgeon: Jackquline Denmark, MD;  Location[facility] ENDOSCOPY;  Service: Endoscopy;  Laterality: N/A;  . GIVENS CAPSULE STUDY N/A 08/06/2013   INCOMPLETE-SMALL BOWLE ULCERS  . IR FLUORO GUIDE CV LINE LEFT  07/29/2019  . IR US GUIDE VASC ACCESS LEFT  07/29/2019  . KNEE SURGERY Right   . PARTIAL HYSTERECTOMY  1978  . PORT-A-CATH REMOVAL Right 11/29/2020   Procedure: REMOVAL OF RIGHT CHEST PORT;  Surgeon: Rosetta Posner, MD;  Location: [facility] OR;  Service: Vascular;  Laterality: Right;  . small bowel capsule  2008   negative  . SPINE SURGERY    . TONSILLECTOMY AND ADENOIDECTOMY    . Two back surgeries/fusion    . UMBILICAL HERNIA  REPAIR  2010  . UPPER EXTREMITY VENOGRAPHY N/A 11/22/2020   Procedure: UPPER EXTREMITY VENOGRAPHY;  Surgeon: Cherre Robins, MD;  Location: [facility] CV LAB;  Service: Cardiovascular;  Laterality: N/A;     OB History   No obstetric history on file.     Family History  Problem Relation Age of Onset  . Colon cancer Brother        diagnosed age 35. Living.   Mar[place] Kitchen Ulcers Sister   . Diabetes Sister   . Heart attack Sister   . Kidney failure Sister   . Stroke Sister   . Ulcers Mother   . Diabetes Mother   . Heart attack Mother   . Stroke Mother   . Asthma Mother   . Heart disease Mother   . Cervical cancer Mother   . Heart attack Brother   . Heart disease Brother   . Asthma Sister   . Diabetes Brother   . Stroke Maternal Grandmother   . Heart attack Maternal Grandmother   . Heart attack Other   . Early death Father        MVA in his 77s    Social History   Tobacco Use  . Smoking status: Former    Packs/day: 1.00    Years: 1.00    Pack years: 1.00    Types: Cigarettes    Start date: 02/19/1961    Quit date: 08/05/1961    Years since quitting: 59.7  . Smokeless tobacco: Never  . Tobacco comments:    1 year in her lifetime  Vaping Use  . Vaping Use: Never used  Substance Use Topics  . Alcohol use: Never  . Drug use: Never    Home Medications Prior to Admission medications   Medication Sig Start Date End Date Taking? Authorizing Provider  acetaminophen (TYLENOL) 325 MG tablet Take 650 mg by mouth daily as needed for headache (pain).   Yes [provider]  allopurinol (ZYLOPRIM) 100 MG tablet Take 1 tablet (100 mg total) by mouth at bedtime. 03/18/21  Yes Aline August, MD  amiodarone (PACERONE) 200 MG tablet Take 1 tablet (200 mg total) by mouth in the morning. 01/15/21  Yes Dagoberto Ligas, PA-C  apixaban (ELIQUIS) 2.5 MG TABS tablet Take 2.5 mg by mouth 2 (two) times daily.   Yes [provider]  Biotin 5000 MCG TABS Take 5,000 mcg by mouth in  the morning.   Yes [provider]  Camphor-Menthol-Methyl Sal (SALONPAS) 3.08-10-08 % PTCH Place 1 patch onto  the skin daily as needed (pain).   Yes [provider]  Carboxymethylcellul-Glycerin (CLEAR EYES FOR DRY EYES) 1-0.25 % SOLN Place 1 drop into both eyes daily as needed (dry eys).   Yes [provider]  Cholecalciferol (VITAMIN D3) 50 MCG (2000 UT) TABS Take 2,000 Units by mouth in the morning.   Yes [provider]  esomeprazole (NEXIUM) 40 MG capsule Take 1 capsule (40 mg total) by mouth 2 (two) times daily before a meal. 02/28/21  Yes Lavina Hamman, MD  ferric citrate (AURYXIA) 1 GM 210 MG(Fe) tablet Take 210 mg by mouth 3 (three) times daily with meals.   Yes [provider]  Fluticasone-Umeclidin-Vilant (TRELEGY ELLIPTA) 100-62.5-25 MCG/INH AEPB Inhale 1 puff into the lungs daily at 12 noon.   Yes [provider]  gabapentin (NEURONTIN) 100 MG capsule Take 100 mg by mouth at bedtime. 12/29/19  Yes [provider]  iron sucrose in sodium chloride 0.9 % 100 mL Iron Sucrose (Venofer) 03/22/21 03/21/22 Yes [provider]  midodrine (PROAMATINE) 10 MG tablet Take 1 tablet (10 mg total) by mouth 3 (three) times daily with meals. 03/18/21 04/17/21 Yes Aline August, MD  nitroGLYCERIN (NITROSTAT) 0.4 MG SL tablet Place 0.4 mg under the tongue every 5 (five) minutes x 3 doses as needed for chest pain. 12/07/19  Yes [provider]  promethazine (PHENERGAN) 12.5 MG tablet Take 1 tablet (12.5 mg total) by mouth every 8 (eight) hours as needed for nausea or vomiting. 03/08/21 04/13/21 Yes Carver, Elon Alas, DO  atorvastatin (LIPITOR) 40 MG tablet Take 40 mg by mouth daily in the afternoon. In the afternoon Patient not taking: Reported on 04/13/2021 01/15/21   Dagoberto Ligas, PA-C  OXYGEN Inhale 2 L into the lungs See admin instructions. Use every night and as needed during the day    [provider]  sucralfate (CARAFATE)  1 GM/10ML suspension Take 10 mLs (1 g total) by mouth 2 (two) times daily for 14 days. 03/18/21 04/01/21  Aline August, MD    Allergies    Patient has no known allergies.  Review of Systems   Review of Systems  Constitutional:  Positive for fatigue. Negative for chills and fever.  HENT:  Negative for facial swelling and trouble swallowing.   Eyes:  Negative for photophobia and visual disturbance.  Respiratory:  Negative for cough and shortness of breath.   Cardiovascular:  Negative for chest pain and palpitations.  Gastrointestinal:  Positive for abdominal pain, nausea and vomiting.  Endocrine: Negative for polydipsia and polyuria.  Genitourinary:  Negative for difficulty urinating and hematuria.  Musculoskeletal:  Negative for gait problem and joint swelling.  Skin:  Negative for pallor and rash.  Neurological:  Negative for syncope and headaches.  Psychiatric/Behavioral:  Negative for agitation and confusion.    Physical Exam Updated Vital Signs BP (!) 109/43   Pulse (!) 123   Temp 98.5 F (36.9 C)   Resp 18   Ht 4\' 8"  (1.422 m)   Wt 72.1 kg   LMP  (LMP Unknown)   SpO2 93%   BMI 35.65 kg/m   Physical Exam Vitals and nursing note reviewed.  Constitutional:      General: She is not in acute distress.    Appearance: Normal appearance. She is obese.  HENT:     Head: Normocephalic and atraumatic.     Right Ear: External ear normal.     Left Ear: External ear normal.     Nose:  Nose normal.     Mouth/Throat:     Mouth: Mucous membranes are moist.  Eyes:     General: No scleral icterus.       Right eye: No discharge.        Left eye: No discharge.  Cardiovascular:     Rate and Rhythm: Tachycardia present. Rhythm irregularly irregular.     Pulses: Normal pulses.     Heart sounds: Normal heart sounds.  Pulmonary:     Effort: Pulmonary effort is normal. No respiratory distress.     Breath sounds: Normal breath sounds.  Abdominal:     General: Abdomen is flat.      Tenderness: There is generalized abdominal tenderness. There is no guarding or rebound.  Musculoskeletal:        General: Normal range of motion.       Arms:     Cervical back: Normal range of motion.     Right lower leg: No edema.     Left lower leg: No edema.  Skin:    General: Skin is warm and dry.     Capillary Refill: Capillary refill takes less than 2 seconds.  Neurological:     Mental Status: She is alert and oriented to person, place, and time.     GCS: GCS eye subscore is 4. GCS verbal subscore is 5. GCS motor subscore is 6.  Psychiatric:        Mood and Affect: Mood normal.        Behavior: Behavior normal.    ED Results / Procedures / Treatments   Labs (all labs ordered are listed, but only abnormal results are displayed) Labs Reviewed  COMPREHENSIVE METABOLIC PANEL - Abnormal; Notable for the following components:      Result Value   Potassium 6.1 (*)    BUN 42 (*)    Creatinine, Ser 10.12 (*)    Calcium 8.4 (*)    Total Protein 5.9 (*)    Albumin 2.0 (*)    AST 14 (*)    GFR, Estimated 4 (*)    Anion gap 16 (*)    All other components within normal limits  CBC WITH DIFFERENTIAL/PLATELET - Abnormal; Notable for the following components:   WBC 15.3 (*)    RBC 3.57 (*)    Hemoglobin 10.3 (*)    HCT 31.8 (*)    Neutro Abs 13.0 (*)    Abs Immature Granulocytes 0.41 (*)    All other components within normal limits  LACTIC ACID, PLASMA - Abnormal; Notable for the following components:   Lactic Acid, Venous 2.1 (*)    All other components within normal limits  I-STAT CHEM 8, ED - Abnormal; Notable for the following components:   Potassium 6.2 (*)    BUN 39 (*)    Creatinine, Ser 10.30 (*)    Calcium, Ion 0.98 (*)    Hemoglobin 10.9 (*)    HCT 32.0 (*)    All other components within normal limits  TROPONIN I (HIGH SENSITIVITY) - Abnormal; Notable for the following components:   Troponin I (High Sensitivity) 21 (*)    All other components within normal  limits  TROPONIN I (HIGH SENSITIVITY) - Abnormal; Notable for the following components:   Troponin I (High Sensitivity) 20 (*)    All other components within normal limits  RESP PANEL BY RT-PCR (FLU A&B, COVID) [id number]  LACTIC ACID, PLASMA  LIPASE, BLOOD  PROTIME-INR    EKG EKG Interpretation  Date/Time:  Friday April 13 2021 15:42:54 EDT Ventricular Rate:  118 PR Interval:  136 QRS Duration: 96 QT Interval:  350 QTC Calculation: 490 R Axis:   2 Text Interpretation: Sinus tachycardia Otherwise normal ECG Confirmed by Wynona Dove ([id number]) on 04/13/2021 9:16:34 PM  Radiology CT ABDOMEN PELVIS WO CONTRAST  Result Date: 04/13/2021 CLINICAL DATA:  Acute nonlocalized abdominal pain EXAM: CT ABDOMEN AND PELVIS WITHOUT CONTRAST TECHNIQUE: Multidetector CT imaging of the abdomen and pelvis was performed following the standard protocol without IV contrast. Unenhanced CT was performed per clinician order. Lack of IV contrast limits sensitivity and specificity, especially for evaluation of abdominal/pelvic solid viscera. COMPARISON:  03/17/2021 FINDINGS: Lower chest: No acute pleural or parenchymal lung disease. Large hiatal hernia is again identified unchanged. Hepatobiliary: Gallbladder is surgically absent. No gross liver abnormalities on this unenhanced exam. Pancreas: Unremarkable. No pancreatic ductal dilatation or surrounding inflammatory changes. Spleen: Normal in size without focal abnormality. Adrenals/Urinary Tract: Marked bilateral renal cortical atrophy compatible with end-stage renal disease. No urinary tract calculi or obstructive uropathy. The adrenals are unremarkable. Bladder is decompressed, limiting its evaluation. Stomach/Bowel: There is diffuse colonic diverticulosis, with with superimposed wall thickening involving the distal descending colon through the rectosigmoid junction, consistent with acute uncomplicated diverticulitis. No bowel obstruction or ileus. Vascular/Lymphatic:  Diffuse atherosclerosis throughout the aorta and its distal branches unchanged. No pathologic adenopathy within the abdomen or pelvis. Reproductive: Status post hysterectomy. No adnexal masses. Other: No free fluid or free gas.  No abdominal wall hernia. Musculoskeletal: There is increasing linear sclerosis within the central aspect of the L1 vertebral body, worrisome for developing insufficiency fracture. There is no significant loss of height or visible fracture line since prior study. Follow-up nonemergent MRI or bone scan could be considered if patient has clinical symptoms of lower back pain. Stable postsurgical changes at L4-5. No other acute bony abnormalities. Reconstructed images demonstrate no additional findings. IMPRESSION: 1. Acute uncomplicated diverticulitis involving the distal descending colon through the rectosigmoid junction. No perforation, fluid collection, or abscess. 2. Increasing linear sclerosis within the central aspect of the L1 vertebral body, worrisome for developing nondisplaced insufficiency fracture. If patient has symptoms localizing to this region, follow-up nonemergent MRI or bone scan could be considered. 3. Stable hiatal hernia. 4.  Aortic Atherosclerosis (ICD10-I70.0). Electronically Signed   By: Randa Ngo M.D.   On: 04/13/2021 17:43   DG Chest 2 View  Result Date: 04/13/2021 CLINICAL DATA:  Nausea EXAM: CHEST - 2 VIEW COMPARISON:  03/15/2021, CT 03/17/2021, radiograph 04/24/2020, 07/19/2020 FINDINGS: Left-sided central venous catheter tip over the SVC. No focal consolidation, pleural effusion or pneumothorax. Stable cardiomediastinal silhouette with large hiatal hernia contributing to right basilar density. No pneumothorax. IMPRESSION: No active cardiopulmonary disease.  Large hiatal hernia Electronically Signed   By: Donavan Foil M.D.   On: 04/13/2021 17:26    Procedures Procedures   Medications Ordered in ED Medications  famotidine (PEPCID) IVPB 20 mg premix  (0 mg Intravenous Stopped 04/13/21 1930)  ondansetron (ZOFRAN) injection 4 mg (4 mg Intravenous Given 04/13/21 1848)  sodium zirconium cyclosilicate (LOKELMA) packet 10 g (10 g Oral Given 04/13/21 1848)    ED Course  I have reviewed the triage vital signs and the nursing notes.  Pertinent labs & imaging results that were available during my care of the patient were reviewed by me and considered in my medical decision making (see chart for details).    MDM Rules/Calculators/A&P  This patient complains of abdominal pain, nausea and vomiting; this involves an extensive number of treatment Options and is a complaint that carries with it a high risk of complications and Morbidity.  Vital signs reviewed.  Serious etiologies considered.   She has missed her last 2 dialysis sessions progress for status stable this time.  Blood pressure stable.  Potassium is elevated 6.2.  No significant EKG changes.  Given Lokelma.  Given likely volume overload will not give IV fluids at this time.  Patient with mildly elevated troponin, EKG without evidence of ischemia, AFIB on tele monitor.  Likely secondary to CKD.  CT abdomen pelvis without IV contrast with acute uncomplicated diverticulitis.  Patient mild elevation to her leukocytosis.  Mild lactic acidosis.  Patient with difficulty tolerating oral intake.  Patient given further antiemetics.  Recommend admission at this time for IV antibiotics for treatment of uncomplicated diverticulitis and will likely require dialysis in the next day or so.  She is agreeable.  D/w [facility] who accepts the patient.   Final Clinical Impression(s) / ED Diagnoses Final diagnoses:  Diverticulitis  Intractable nausea and vomiting  ESRD on hemodialysis (HCC)  Elevated troponin  Abdominal pain, unspecified abdominal location    Rx / DC Orders ED Discharge Orders     None        Jeanell Sparrow, DO 04/13/21 2118

## 2021-04-13 NOTE — ED Provider Notes (Signed)
Emergency Medicine Provider Triage Evaluation Note  [name] , a 75 y.o. female  was evaluated in triage.  Pt complains of feeling poorly.  She states that she missed her last 2 dialysis appointments due to nausea vomiting and diarrhea.  She has been taking Phenergan and Imodium.  She states that she normally takes a few On dialysis days to help with her nausea.  She, per EMS, was spitting and dry heaving without vomiting.  She states that this feels similar to what happened to her 1 month ago..  Review of Systems  Positive: Nausea, vomiting, diarrhea, tachycardia Negative: Fevers, syncope  Physical Exam  BP 95/69   Pulse (!) 119   Temp 98.5 F (36.9 C)   Resp 18   Ht 4\' 8"  (1.422 m)   Wt 72.1 kg   LMP  (LMP Unknown)   SpO2 100%   BMI 35.65 kg/m  Gen:   Awake, frequently mildly dry heaving, appears uncomfortable Resp:  Normal effort  MSK:   Moves extremities without difficulty  Other:  Appears to feel unwell  Medical Decision Making  Medically screening exam initiated at 4:09 PM.  Appropriate orders placed.  ANTONELA FREIMAN was informed that the remainder of the evaluation will be completed by another provider, this initial triage assessment does not replace that evaluation, and the importance of remaining in the ED until their evaluation is complete.  Note: Portions of this report may have been transcribed using voice recognition software. Every effort was made to ensure accuracy; however, inadvertent computerized transcription errors may be present    Lorin Glass, PA-C 04/13/21 1610    Lajean Saver, MD 04/13/21 [phone]

## 2021-04-13 NOTE — ED Notes (Signed)
The pt has a dislysis catheter in her lt upper chest

## 2021-04-13 NOTE — Progress Notes (Signed)

## 2021-04-13 NOTE — H&P (Signed)
History and Physical:    [name]   IWP:[id number] DOB: Dec 12, 1945 DOA: 04/13/2021  Referring MD/provider: Dr. Pearline Cables PCP: [name], MD   Patient coming from: Home. Lives with son.  Chief Complaint: Nausea and vomiting x 4 days  History of Present Illness:   [name] is an 75 y.o. female with PMH of ESRD on HD at [place] [facility], extensive diverticulosis this postop stabilization 03/13/2021 - 03/18/2021 for treatment of GI bleeding in the setting of gastritis and large hiatal hernia.  Discharged on a course of Augmentin for possible diverticulitis at that time.  She now presents with a 4-day history of nausea and vomiting with inability to tolerate any oral intake or her medications.  Because of her symptoms, she also missed her last 2 dialysis treatments.  Nothing makes her symptoms better except for lying still.  Movement makes her symptoms worse.  ED Course: In the ED, the patient was noted to have a blood pressure of 109/43, pulse 123, respirations 18, SPO2 93% on room air.  Labs were notable for a potassium of 6.1, bicarb 22, BUN 42, creatinine 10.12, anion gap 16, WBC 15.3, hemoglobin 10.3 (up from 9.43 weeks ago), lipase normal at 29, INR 1.1, lactic acid 1.9.  The of the abdomen shows acute uncomplicated diverticulitis involving the distal descending colon through the rectosigmoid junction.  Stable hiatal hernia.  EKG showed no acute changes.  She was treated with Zofran, Lokelma, midodrine, Rocephin, Flagyl, and Pepcid.  ROS:   Review of Systems  Constitutional:  Positive for malaise/fatigue.  HENT: Negative.    Eyes: Negative.   Respiratory:  Positive for cough.   Cardiovascular:  Positive for leg swelling.  Gastrointestinal:  Positive for abdominal pain, diarrhea, nausea and vomiting. Negative for blood in stool and melena.  Genitourinary: Negative.   Musculoskeletal: Negative.   Skin:        Ecchymosis  Neurological: Negative.    Endo/Heme/Allergies:  Bruises/bleeds easily.  Psychiatric/Behavioral: Negative.     Past Medical History:   Past Medical History:  Diagnosis Date   Blood transfusion without reported diagnosis    CAD (coronary artery disease)    Nonobstructive at cardiac catheterization 2000   Cataract    Cervical cancer ([place]) 1978   CHF (congestive heart failure) (HCC)    Chronic back pain    Chronic kidney disease    Class 2 obesity with body mass index (BMI) of 35 to 39.9 without comorbidity    Complication of anesthesia    hard to awaken with one back surgery - years ago, no problem since   COPD (chronic obstructive pulmonary disease) ([place])    Degenerative disc disease    DM (diabetes mellitus), type 2 with renal complications ([place])    Dyslipidemia    Dyspnea    "due to COPD"   Dysrhythmia    a-fib   ESRD (end stage renal disease) on dialysis ([place])    TTHS- [address]   GERD (gastroesophageal reflux disease)    Gout    Hiatal hernia 07/27/2013   History of diverticulitis of colon    History of hiatal hernia    HTN (hypertension)    09/09/20 not currently on medication, is on medication for hypotension   Hypotension    Iron deficiency anemia    Irritable bowel syndrome    Lumbar radiculopathy    Mixed hyperlipidemia    Moderate major depression, 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 States this was cervical cancer   Oxygen deficiency    room air   PAF (paroxysmal atrial fibrillation) (HCC)    Pneumonia    Sleep apnea    bipap - oxygen 2 l to bipap.   Type 2 diabetes mellitus ([place])    Vitamin B deficiency 12/25/2009   Vitamin B12 deficiency     Past Surgical History:   Past Surgical History:  Procedure Laterality Date   ABDOMINAL HYSTERECTOMY     APPLICATION OF WOUND VAC Right 01/15/2021   Procedure: APPLICATION OF WOUND VAC;  Surgeon: Cherre Robins, MD;   Location: [place];  Service: Vascular;  Laterality: Right;   AV FISTULA PLACEMENT Left 08/05/2019   Procedure: ARTERIOVENOUS (AV) FISTULA CREATION LEFT ARM;  Surgeon: Waynetta Sandy, MD;  Location: [place];  Service: Vascular;  Laterality: Left;   AV FISTULA PLACEMENT Left 09/11/2020   Procedure: INSERTION OF ARTERIOVENOUS (AV) GORE-TEX GRAFT ARM LEFT;  Surgeon: Cherre Robins, MD;  Location: [place];  Service: Vascular;  Laterality: Left;   AV FISTULA PLACEMENT Right 11/29/2020   Procedure: Creation of Arteriovenous Fistula Right Upper Arm;  Surgeon: Rosetta Posner, MD;  Location: [place];  Service: Vascular;  Laterality: Right;   [place] Left 10/05/2019   Procedure: SECOND STAGE LEFT BASCILIC VEIN TRANSPOSITION;  Surgeon: Waynetta Sandy, MD;  Location: [place];  Service: Vascular;  Laterality: Left;   [place] Right 01/15/2021   Procedure: RIGHT UPPER ARM SECOND STAGE [place];  Surgeon: Cherre Robins, MD;  Location: [place];  Service: Vascular;  Laterality: Right;  PERIPHERAL NERVE BLOCK   Benign breast cysts     BIOPSY  02/26/2021   Procedure: BIOPSY;  Surgeon: Jackquline Denmark, MD;  Location: [facility] ENDOSCOPY;  Service: Endoscopy;;   CHOLECYSTECTOMY     COLONOSCOPY  10/01/2006   SLF:Pan colonic diverticulosis and moderate internal hemorrhoids/ Otherwise no polyps, masses, inflammatory changes or AVMs/   COLONOSCOPY  2011   SLF: pancolonic diverticulosis, large internal hemorrhoids   COLONOSCOPY N/A 01/26/2016   Procedure: COLONOSCOPY;  Surgeon: [name], MD;  [facility];  Service: Endoscopy;  Laterality: N/A;  830    COLONOSCOPY WITH PROPOFOL N/A 02/10/2020   Procedure: COLONOSCOPY WITH PROPOFOL;  Surgeon: Daneil Dolin, MD;  [facility];  Service: Endoscopy;  Laterality: N/A;  10:45am   ESOPHAGEAL DILATION  02/26/2021   Procedure: ESOPHAGEAL DILATION;  Surgeon: Jackquline Denmark, MD;   Location[facility] END[facility];  Service: Endoscopy;;   ESOPHAGOGASTRODUODENOSCOPY  11/19/2006   SLF: Large hiatal hernia without evidence of Cameron ulcers/. Distal esophageal stricture, which allowed the gastroscope to pass without resistance.  A 16 mm Savary later passed with mild resistance/ Normal stomach.sb bx negative   ESOPHAGOGASTRODUODENOSCOPY  10/01/2006   GYI:RSWNI hiatal hernia.  Distal esophagus without evidence of   erythema, ulceration or Barrett's esophagus   ESOPHAGOGASTRODUODENOSCOPY  2011   SLF: large hh, distal esophageal web narrowing to 30mm s/p dilation to 23mm   ESOPHAGOGASTRODUODENOSCOPY N/A 08/06/2013   SLF: 1. Stricture at the gastroesophageal junction 2. large hiatal hernia. 3. Mild erosive gastritis.   ESOPHAGOGASTRODUODENOSCOPY (EGD) WITH PROPOFOL N/A 07/19/2019   rourk: Mild erosive reflux esophagitis.  Mild Schatzki ring status post dilation.  Large hiatal hernia with at least one half of the stomach  above the diaphragm.  Gastric mucosa erythematous.   ESOPHAGOGASTRODUODENOSCOPY (EGD) WITH PROPOFOL N/A 02/26/2021   Procedure: ESOPHAGOGASTRODUODENOSCOPY (EGD) WITH PROPOFOL;  Surgeon: Jackquline Denmark, MD;  Location[facility] END[facility]COPY;  Service: Endoscopy;  Laterality: N/A;   GIVENS CAPSULE STUDY N/A 08/06/2013   INCOMPLETE-SMALL BOWLE ULCERS   IR FLUORO GUIDE CV LINE LEFT  07/29/2019   IR US GUIDE VASC ACCESS LEFT  07/29/2019   KNEE SURGERY Right    PARTIAL HYSTERECTOMY  1978   PORT-A-CATH REMOVAL Right 11/29/2020   Procedure: REMOVAL OF RIGHT CHEST PORT;  Surgeon: Rosetta Posner, MD;  Location: [place];  Service: Vascular;  Laterality: Right;   small bowel capsule  2008   negative   SPINE SURGERY     TONSILLECTOMY AND ADENOIDECTOMY     Two back surgeries/fusion     UMBILICAL HERNIA REPAIR  2010   UPPER EXTREMITY VENOGRAPHY N/A 11/22/2020   Procedure: UPPER EXTREMITY VENOGRAPHY;  Surgeon: Cherre Robins, MD;  Location: [place] CV LAB;  Service: Cardiovascular;  Laterality:  N/A;    Social History:   Social History   Socioeconomic History   Marital status: Widowed    Spouse name: Dutan   Number of children: 4   Years of education: Not on file   Highest education level: 10th grade  Occupational History   Occupation: retired    Comment: Ambulance person, [place]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 Lives in home with Habel - married 13 Y Cook, sew, quilt, crochet   Social Determinants of Health   Financial Resource Strain: Not on file  Food Insecurity: Not on file  Transportation Needs: Not on file  Physical Activity: Not on file  Stress: Not on file  Social Connections: Not on file  Intimate Partner Violence: Not on file    Allergies   Patient has no known allergies.  Family history:   Family History  Problem Relation Age of Onset   Colon cancer Brother        diagnosed age 108. Living.    Ulcers Sister    Diabetes Sister    Heart attack Sister    Kidney failure Sister    Stroke Sister    Ulcers Mother    Diabetes Mother    Heart attack Mother    Stroke Mother    Asthma Mother    Heart disease Mother    Cervical cancer Mother    Heart attack Brother    Heart disease Brother    Asthma Sister    Diabetes Brother    Stroke Maternal Grandmother    Heart attack Maternal Grandmother    Heart attack Other    Early death Father        MVA in his 15s  Current Medications:   Prior to  Admission medications   Medication Sig Start Date End Date Taking? Authorizing Provider  acetaminophen (TYLENOL) 325 MG tablet Take 650 mg by mouth daily as needed for headache (pain).   Yes [provider]  allopurinol (ZYLOPRIM) 100 MG tablet Take 1 tablet (100 mg total) by mouth at bedtime. 03/18/21  Yes Aline August, MD  amiodarone (PACERONE) 200 MG tablet Take 1 tablet (200 mg total) by mouth in the morning. 01/15/21  Yes Dagoberto Ligas, PA-C  apixaban (ELIQUIS) 2.5 MG TABS tablet Take 2.5 mg by mouth 2 (two) times daily.   Yes [provider]  Biotin 5000 MCG TABS Take 5,000 mcg by mouth in the morning.   Yes [provider]  Camphor-Menthol-Methyl Sal (SALONPAS) 3.08-10-08 % PTCH Place 1 patch onto the skin daily as needed (pain).   Yes [provider]  Carboxymethylcellul-Glycerin (CLEAR EYES FOR DRY EYES) 1-0.25 % SOLN Place 1 drop into both eyes daily as needed (dry eys).   Yes [provider]  Cholecalciferol (VITAMIN D3) 50 MCG (2000 UT) TABS Take 2,000 Units by mouth in the morning.   Yes [provider]  esomeprazole (NEXIUM) 40 MG capsule Take 1 capsule (40 mg total) by mouth 2 (two) times daily before a meal. 02/28/21  Yes Lavina Hamman, MD  ferric citrate (AURYXIA) 1 GM 210 MG(Fe) tablet Take 210 mg by mouth 3 (three) times daily with meals.   Yes [provider]  Fluticasone-Umeclidin-Vilant (TRELEGY ELLIPTA) 100-62.5-25 MCG/INH AEPB Inhale 1 puff into the lungs daily at 12 noon.   Yes [provider]  gabapentin (NEURONTIN) 100 MG capsule Take 100 mg by mouth at bedtime. 12/29/19  Yes [provider]  iron sucrose in sodium chloride 0.9 % 100 mL Iron Sucrose (Venofer) 03/22/21 03/21/22 Yes [provider]  midodrine (PROAMATINE) 10 MG tablet Take 1 tablet (10 mg total) by mouth 3 (three) times daily with meals. 03/18/21 04/17/21 Yes Aline August, MD  nitroGLYCERIN (NITROSTAT) 0.4 MG SL tablet  Place 0.4 mg under the tongue every 5 (five) minutes x 3 doses as needed for chest pain. 12/07/19  Yes [provider]  promethazine (PHENERGAN) 12.5 MG tablet Take 1 tablet (12.5 mg total) by mouth every 8 (eight) hours as needed for nausea or vomiting. 03/08/21 04/13/21 Yes Carver, Elon Alas, DO  atorvastatin (LIPITOR) 40 MG tablet Take 40 mg by mouth daily in the afternoon. In the afternoon Patient not taking: Reported on 04/13/2021 01/15/21   Dagoberto Ligas, PA-C  OXYGEN Inhale 2 L into the lungs See admin instructions. Use every night and as needed during the day    [provider]  sucralfate (CARAFATE) 1 GM/10ML suspension Take 10 mLs (1 g total) by mouth 2 (two) times daily for 14 days. 03/18/21 04/01/21  Aline August, MD    Physical Exam:   Vitals:   04/13/21 1915 04/13/21 1945 04/13/21 2100 04/13/21 2115  BP: (!) 103/49 101/83 (!) 109/43 (!) 108/50  Pulse: (!) 123 (!) 102 (!) 123 97  Resp: 19 (!) 23 18 14   Temp:      SpO2: 100% 99% 93% 92%  Weight:      Height:         Physical Exam: Blood pressure (!) 108/50, pulse 97, temperature 98.5 F (36.9 C), resp. rate 14, height 4\' 8"  (1.422 m), weight 72.1 kg, SpO2 92 %. Gen: No acute distress.  Chronically ill-appearing Head: Normocephalic, atraumatic. Eyes: Pupils equal,  round and reactive to light. Extraocular movements intact.  Sclerae nonicteric. No lid lag. Mouth: Oropharynx reveals moist mucous membranes.  Edentulous Neck: Supple, no thyromegaly, no lymphadenopathy, no jugular venous distention. Chest: Lungs are clear to auscultation with good air movement. No rales, rhonchi or wheezes.  Dialysis catheter left chest wall. CV: Heart sounds are regular with an S1, S2. No murmurs, rubs, clicks, or gallops.  Abdomen: Soft, tender to the lower abdomen, nondistended with normal active bowel sounds. No hepatosplenomegaly or palpable masses. Extremities: Extremities are without clubbing, or cyanosis.  1+ edema. Pedal  pulses 2+.  Skin: Warm and dry. No rashes, lesions or wounds.  Extensive ecchymosis to the left upper extremity. Neuro: Alert and oriented times 3; grossly nonfocal.  Psych: Insight is good and judgment is appropriate. Mood and affect normal.   Data Review:    Labs: Basic Metabolic Panel: Recent Labs  Lab 04/13/21 1608 04/13/21 1733  NA 136 135  K 6.1* 6.2*  CL 98 104  CO2 22  --   GLUCOSE 83 74  BUN 42* 39*  CREATININE 10.12* 10.30*  CALCIUM 8.4*  --    Liver Function Tests: Recent Labs  Lab 04/13/21 1608  AST 14*  ALT 10  ALKPHOS 84  BILITOT 0.8  PROT 5.9*  ALBUMIN 2.0*   Recent Labs  Lab 04/13/21 1608  LIPASE 29   CBC: Recent Labs  Lab 04/13/21 1608 04/13/21 1733  WBC 15.3*  --   NEUTROABS 13.0*  --   HGB 10.3* 10.9*  HCT 31.8* 32.0*  MCV 89.1  --   PLT 279  --      Urinalysis    Component Value Date/Time   COLORURINE YELLOW 04/24/2020 1528   APPEARANCEUR HAZY (A) 04/24/2020 1528   LABSPEC 1.013 04/24/2020 1528   PHURINE 7.0 04/24/2020 1528   GLUCOSEU NEGATIVE 04/24/2020 1528   HGBUR NEGATIVE 04/24/2020 1528   BILIRUBINUR NEGATIVE 04/24/2020 1528   KETONESUR NEGATIVE 04/24/2020 1528   PROTEINUR 100 (A) 04/24/2020 1528   UROBILINOGEN 0.2 01/29/2007 1500   NITRITE NEGATIVE 04/24/2020 1528   LEUKOCYTESUR NEGATIVE 04/24/2020 1528      Radiographic Studies: CT ABDOMEN PELVIS WO CONTRAST  Result Date: 04/13/2021 CLINICAL DATA:  Acute nonlocalized abdominal pain EXAM: CT ABDOMEN AND PELVIS WITHOUT CONTRAST TECHNIQUE: Multidetector CT imaging of the abdomen and pelvis was performed following the standard protocol without IV contrast. Unenhanced CT was performed per clinician order. Lack of IV contrast limits sensitivity and specificity, especially for evaluation of abdominal/pelvic solid viscera. COMPARISON:  03/17/2021 FINDINGS: Lower chest: No acute pleural or parenchymal lung disease. Large hiatal hernia is again identified unchanged.  Hepatobiliary: Gallbladder is surgically absent. No gross liver abnormalities on this unenhanced exam. Pancreas: Unremarkable. No pancreatic ductal dilatation or surrounding inflammatory changes. Spleen: Normal in size without focal abnormality. Adrenals/Urinary Tract: Marked bilateral renal cortical atrophy compatible with end-stage renal disease. No urinary tract calculi or obstructive uropathy. The adrenals are unremarkable. Bladder is decompressed, limiting its evaluation. Stomach/Bowel: There is diffuse colonic diverticulosis, with with superimposed wall thickening involving the distal descending colon through the rectosigmoid junction, consistent with acute uncomplicated diverticulitis. No bowel obstruction or ileus. Vascular/Lymphatic: Diffuse atherosclerosis throughout the aorta and its distal branches unchanged. No pathologic adenopathy within the abdomen or pelvis. Reproductive: Status post hysterectomy. No adnexal masses. Other: No free fluid or free gas.  No abdominal wall hernia. Musculoskeletal: There is increasing linear sclerosis within the central aspect of the L1 vertebral body, worrisome for developing insufficiency  fracture. There is no significant loss of height or visible fracture line since prior study. Follow-up nonemergent MRI or bone scan could be considered if patient has clinical symptoms of lower back pain. Stable postsurgical changes at L4-5. No other acute bony abnormalities. Reconstructed images demonstrate no additional findings. IMPRESSION: 1. Acute uncomplicated diverticulitis involving the distal descending colon through the rectosigmoid junction. No perforation, fluid collection, or abscess. 2. Increasing linear sclerosis within the central aspect of the L1 vertebral body, worrisome for developing nondisplaced insufficiency fracture. If patient has symptoms localizing to this region, follow-up nonemergent MRI or bone scan could be considered. 3. Stable hiatal hernia. 4.  Aortic  Atherosclerosis (ICD10-I70.0). Electronically Signed   By: Randa Ngo M.D.   On: 04/13/2021 17:43   DG Chest 2 View  Result Date: 04/13/2021 CLINICAL DATA:  Nausea EXAM: CHEST - 2 VIEW COMPARISON:  03/15/2021, CT 03/17/2021, radiograph 04/24/2020, 07/19/2020 FINDINGS: Left-sided central venous catheter tip over the SVC. No focal consolidation, pleural effusion or pneumothorax. Stable cardiomediastinal silhouette with large hiatal hernia contributing to right basilar density. No pneumothorax. IMPRESSION: No active cardiopulmonary disease.  Large hiatal hernia Electronically Signed   By: Donavan Foil M.D.   On: 04/13/2021 17:26    EKG: Independently reviewed. No peaked T waves.  Sinus tachycardia at 118 bpm.   Assessment/Plan:   Principal Problem:   Intractable nausea and vomiting in the setting of diverticulitis and end-stage renal disease Nausea likely from a combination of diverticulitis and uremia.  Will treat symptomatically with as needed Zofran, IV antibiotics to treat diverticulitis, and dialysis in the morning.  Active Problems: Hyperkalemia   Treated with Lokelma.  Dialysis in the morning.  No EKG changes, but will monitor on telemetry.    Diverticulitis with generalized abdominal pain Placed on empiric Flagyl and Rocephin.  CT scan was reviewed.    Iron deficiency anemia due to chronic blood loss Continue iron as tolerated.    OSA (obstructive sleep apnea) Continue BiPAP at night.    HLD (hyperlipidemia) Continue Lipitor.    Hiatal hernia with GERD and esophagitis Will place on PPI therapy and Carafate.    Chronic obstructive pulmonary disease ([place]) Continue Trelegy Ellipta.    Chronic diastolic CHF (congestive heart failure) (HCC) Appears compensated.    Chronic gout 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 Continue allopurinol.    Paroxysmal atrial fibrillation (HCC) Continue amiodarone and Eliquis.    ESRD (end stage renal disease) on dialysis  [facility]) Notify renal in the morning of need for hemodialysis.    Secondary hyperparathyroidism of renal origin ([place])    Obesity Body mass index is 35.65 kg/m.  Other information:   DVT prophylaxis: Eliquis. Code Status: Full code.  Confirmed with the patient. Family Communication: No family currently at the bedside. Disposition Plan: Home in 1-2 days. Consults called: Renal will be consulted in the morning. Admission status: Observation.  The medical decision making on this patient was of high complexity and the patient is at high risk for clinical deterioration, therefore this is a level 3 visit.    Margreta Journey Eschol Auxier [facility]    How to contact the [facility] Attending or Consulting provider [place] or covering provider during after hours [place], for this patient?   Check the care team in [facility] and look for a) attending/consulting [facility] provider listed and b) the [facility] team listed Log into [email or web address] and use [place]'s universal password to access. If you do not have the password, please contact the  hospital operator. Locate the [facility] provider you are looking for under [facility] and page to a number that you can be directly reached. If you still have difficulty reaching the provider, please page the [facility] (Director on Call) for the Hospitalists listed on amion for assistance.  04/13/2021, 9:43 PM

## 2021-04-13 NOTE — ED Notes (Signed)
The pt had phenergan this am  she takes it 3 times a day

## 2021-04-14 DIAGNOSIS — I480 Paroxysmal atrial fibrillation: Secondary | ICD-10-CM | POA: Diagnosis present

## 2021-04-14 DIAGNOSIS — G4733 Obstructive sleep apnea (adult) (pediatric): Secondary | ICD-10-CM | POA: Diagnosis present

## 2021-04-14 DIAGNOSIS — I9589 Other hypotension: Secondary | ICD-10-CM | POA: Diagnosis present

## 2021-04-14 DIAGNOSIS — N186 End stage renal disease: Secondary | ICD-10-CM | POA: Diagnosis present

## 2021-04-14 DIAGNOSIS — R609 Edema, unspecified: Secondary | ICD-10-CM | POA: Diagnosis not present

## 2021-04-14 DIAGNOSIS — K2960 Other gastritis without bleeding: Secondary | ICD-10-CM | POA: Diagnosis present

## 2021-04-14 DIAGNOSIS — N2581 Secondary hyperparathyroidism of renal origin: Secondary | ICD-10-CM | POA: Diagnosis present

## 2021-04-14 DIAGNOSIS — E669 Obesity, unspecified: Secondary | ICD-10-CM | POA: Diagnosis present

## 2021-04-14 DIAGNOSIS — D631 Anemia in chronic kidney disease: Secondary | ICD-10-CM | POA: Diagnosis present

## 2021-04-14 DIAGNOSIS — I5032 Chronic diastolic (congestive) heart failure: Secondary | ICD-10-CM | POA: Diagnosis present

## 2021-04-14 DIAGNOSIS — J449 Chronic obstructive pulmonary disease, unspecified: Secondary | ICD-10-CM | POA: Diagnosis present

## 2021-04-14 DIAGNOSIS — K2100 Gastro-esophageal reflux disease with esophagitis, without bleeding: Secondary | ICD-10-CM | POA: Diagnosis present

## 2021-04-14 DIAGNOSIS — E875 Hyperkalemia: Secondary | ICD-10-CM | POA: Diagnosis present

## 2021-04-14 DIAGNOSIS — I808 Phlebitis and thrombophlebitis of other sites: Secondary | ICD-10-CM | POA: Diagnosis not present

## 2021-04-14 DIAGNOSIS — D500 Iron deficiency anemia secondary to blood loss (chronic): Secondary | ICD-10-CM | POA: Diagnosis present

## 2021-04-14 DIAGNOSIS — K449 Diaphragmatic hernia without obstruction or gangrene: Secondary | ICD-10-CM | POA: Diagnosis present

## 2021-04-14 DIAGNOSIS — R112 Nausea with vomiting, unspecified: Secondary | ICD-10-CM | POA: Diagnosis present

## 2021-04-14 DIAGNOSIS — M7989 Other specified soft tissue disorders: Secondary | ICD-10-CM | POA: Diagnosis not present

## 2021-04-14 DIAGNOSIS — Z20822 Contact with and (suspected) exposure to covid-19: Secondary | ICD-10-CM | POA: Diagnosis present

## 2021-04-14 DIAGNOSIS — Z6832 Body mass index (BMI) 32.0-32.9, adult: Secondary | ICD-10-CM | POA: Diagnosis not present

## 2021-04-14 DIAGNOSIS — I2510 Atherosclerotic heart disease of native coronary artery without angina pectoris: Secondary | ICD-10-CM | POA: Diagnosis present

## 2021-04-14 DIAGNOSIS — E782 Mixed hyperlipidemia: Secondary | ICD-10-CM | POA: Diagnosis present

## 2021-04-14 DIAGNOSIS — M1A30X0 Chronic gout due to renal impairment, unspecified site, without tophus (tophi): Secondary | ICD-10-CM | POA: Diagnosis not present

## 2021-04-14 DIAGNOSIS — K5792 Diverticulitis of intestine, part unspecified, without perforation or abscess without bleeding: Secondary | ICD-10-CM | POA: Diagnosis not present

## 2021-04-14 DIAGNOSIS — K5732 Diverticulitis of large intestine without perforation or abscess without bleeding: Secondary | ICD-10-CM | POA: Diagnosis present

## 2021-04-14 DIAGNOSIS — I132 Hypertensive heart and chronic kidney disease with heart failure and with stage 5 chronic kidney disease, or end stage renal disease: Secondary | ICD-10-CM | POA: Diagnosis present

## 2021-04-14 DIAGNOSIS — Z992 Dependence on renal dialysis: Secondary | ICD-10-CM | POA: Diagnosis not present

## 2021-04-14 DIAGNOSIS — M79602 Pain in left arm: Secondary | ICD-10-CM | POA: Diagnosis not present

## 2021-04-14 DIAGNOSIS — E1122 Type 2 diabetes mellitus with diabetic chronic kidney disease: Secondary | ICD-10-CM | POA: Diagnosis present

## 2021-04-14 DIAGNOSIS — E1142 Type 2 diabetes mellitus with diabetic polyneuropathy: Secondary | ICD-10-CM | POA: Diagnosis present

## 2021-04-14 LAB — CBC
HCT: 28.2 % — ABNORMAL LOW (ref 36.0–46.0)
Hemoglobin: 9.2 g/dL — ABNORMAL LOW (ref 12.0–15.0)
MCH: 28.8 pg (ref 26.0–34.0)
MCHC: 32.6 g/dL (ref 30.0–36.0)
MCV: 88.4 fL (ref 80.0–100.0)
Platelets: 248 10*3/uL (ref 150–400)
RBC: 3.19 MIL/uL — ABNORMAL LOW (ref 3.87–5.11)
RDW: 15.8 % — ABNORMAL HIGH (ref 11.5–15.5)
WBC: 17.8 10*3/uL — ABNORMAL HIGH (ref 4.0–10.5)
nRBC: 0 % (ref 0.0–0.2)

## 2021-04-14 LAB — BASIC METABOLIC PANEL
Anion gap: 13 (ref 5–15)
BUN: 42 mg/dL — ABNORMAL HIGH (ref 8–23)
CO2: 24 mmol/L (ref 22–32)
Calcium: 8.1 mg/dL — ABNORMAL LOW (ref 8.9–10.3)
Chloride: 100 mmol/L (ref 98–111)
Creatinine, Ser: 9.9 mg/dL — ABNORMAL HIGH (ref 0.44–1.00)
GFR, Estimated: 4 mL/min — ABNORMAL LOW (ref >60)
Glucose, Bld: 86 mg/dL (ref 70–99)
Potassium: 6.2 mmol/L — ABNORMAL HIGH (ref 3.5–5.1)
Sodium: 137 mmol/L (ref 135–145)

## 2021-04-14 MED ORDER — ALTEPLASE 2 MG IJ SOLR: 2.0000 mg | Freq: Once | INTRAMUSCULAR | Status: DC | PRN

## 2021-04-14 MED ORDER — LIDOCAINE-PRILOCAINE 2.5-2.5 % EX CREA: 1.0000 "application " | TOPICAL_CREAM | CUTANEOUS | Status: DC | PRN

## 2021-04-14 MED ORDER — SODIUM CHLORIDE 0.9 % IV SOLN
100.0000 mL | INTRAVENOUS | Status: DC | PRN
Start: 2021-04-14 — End: 2021-04-14

## 2021-04-14 MED ORDER — DARBEPOETIN ALFA 60 MCG/0.3ML IJ SOSY
60.0000 ug | PREFILLED_SYRINGE | INTRAMUSCULAR | Status: DC
  Administered 2021-04-14: 60 ug via INTRAVENOUS
  Filled 2021-04-14: qty 0.3

## 2021-04-14 MED ORDER — SODIUM CHLORIDE 0.9 % IV SOLN: 100.0000 mL | INTRAVENOUS | Status: DC | PRN

## 2021-04-14 MED ORDER — HEPARIN SODIUM (PORCINE) 1000 UNIT/ML DIALYSIS: 1000.0000 [IU] | INTRAMUSCULAR | Status: DC | PRN

## 2021-04-14 MED ORDER — CHLORHEXIDINE GLUCONATE CLOTH 2 % EX PADS
6.0000 | MEDICATED_PAD | Freq: Every day | CUTANEOUS | Status: DC
  Administered 2021-04-14 – 2021-04-18 (×3): 6 via TOPICAL

## 2021-04-14 MED ORDER — PENTAFLUOROPROP-TETRAFLUOROETH EX AERO: 1.0000 "application " | INHALATION_SPRAY | CUTANEOUS | Status: DC | PRN

## 2021-04-14 MED ORDER — SODIUM CHLORIDE 0.9 % IV SOLN
3.0000 g | INTRAVENOUS | Status: DC
  Administered 2021-04-14 – 2021-04-15 (×2): 3 g via INTRAVENOUS
  Filled 2021-04-14 (×3): qty 8

## 2021-04-14 MED ORDER — CHLORHEXIDINE GLUCONATE CLOTH 2 % EX PADS
6.0000 | MEDICATED_PAD | Freq: Every day | CUTANEOUS | Status: DC
  Administered 2021-04-14 – 2021-04-21 (×8): 6 via TOPICAL

## 2021-04-14 MED ORDER — LIDOCAINE HCL (PF) 1 % IJ SOLN: 5.0000 mL | INTRAMUSCULAR | Status: DC | PRN

## 2021-04-14 NOTE — Care Management Obs Status (Signed)
[place] NOTIFICATION   Patient Details  Name: KIARALIZ RAFUSE MRN: [id number] Date of Birth: 1945/09/02   Medicare Observation Status Notification Given:  Yes    Bartholomew Crews, RN 04/14/2021, 4:10 PM

## 2021-04-14 NOTE — Procedures (Signed)
   I was present at this dialysis session, have reviewed the session itself and made  appropriate changes Kelly Splinter MD [place] pager [phone]   04/14/2021, 4:52 PM

## 2021-04-14 NOTE — Progress Notes (Signed)
[facility] Progress Note  Patient: [name]    WHQ:[id number]  DOA: 04/13/2021     Date of Service: the patient was seen and examined on 04/14/2021  Brief hospital course: Past medical history of ESRD on HD TTS, recurrent GI bleed, hiatal hernia, obesity.  Presents with complaints of abdominal pain.  Found to have diverticulitis. Currently plan is continue current antibiotic therapy.  Subjective: Continues to have abdominal pain.  Intermittent nausea.  No vomiting.  Still has diarrhea without any bleeding.  Assessment and Plan: Intractable nausea and vomiting in the setting of diverticulitis and end-stage renal disease Nausea likely from a combination of diverticulitis and uremia. Currently improving but still symptomatic. Continue with IV antibiotics. Monitor.   Active Problems: Hyperkalemia Treated with Lokelma. Monitor on telemetry. Treated with HD.     Diverticulitis with generalized abdominal pain Placed on empiric Flagyl and Rocephin.  CT scan was reviewed. Switch to Unasyn.    Iron deficiency anemia due to chronic blood loss Continue iron as tolerated.     OSA (obstructive sleep apnea) Continue BiPAP at night.     HLD (hyperlipidemia) Continue Lipitor.     Hiatal hernia with GERD and esophagitis Will place on PPI therapy and Carafate.     Chronic obstructive pulmonary disease ([place]) Continue Trelegy Ellipta.     Chronic diastolic CHF (congestive heart failure) (HCC) Appears compensated.     Chronic gout 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 Continue allopurinol.     Paroxysmal atrial fibrillation (HCC) Continue amiodarone and Eliquis.     ESRD (end stage renal disease) on dialysis [facility]) Notify renal in the morning of need for hemodialysis.     Secondary hyperparathyroidism of renal origin ([place])     Obesity Body mass index is 35.65 kg/m.  Scheduled Meds:  allopurinol  100 mg Oral QHS   amiodarone  200 mg Oral q AM   apixaban  2.5 mg  Oral BID   atorvastatin  40 mg Oral Q1500   Chlorhexidine Gluconate Cloth  6 each Topical Daily   Chlorhexidine Gluconate Cloth  6 each Topical Q0600   cholecalciferol  2,000 Units Oral Daily   darbepoetin (ARANESP) injection - DIALYSIS  60 mcg Intravenous Q Sat-HD   ferric citrate  210 mg Oral TID WC   fluticasone furoate-vilanterol  1 puff Inhalation Daily   And   umeclidinium bromide  1 puff Inhalation Daily   gabapentin  100 mg Oral QHS   midodrine  10 mg Oral TID WC   pantoprazole (PROTONIX) IV  40 mg Intravenous QHS   sodium chloride flush  3 mL Intravenous Q12H   sucralfate  1 g Oral BID   Continuous Infusions:  sodium chloride     ampicillin-sulbactam (UNASYN) IV     PRN Meds: sodium chloride, acetaminophen, Muscle Rub, nitroGLYCERIN, ondansetron **OR** ondansetron (ZOFRAN) IV, polyvinyl alcohol, sodium chloride flush  Body mass index is 32.57 kg/m.        DVT Prophylaxis:   apixaban (ELIQUIS) tablet 2.5 mg Start: 04/13/21 2315 apixaban (ELIQUIS) tablet 2.5 mg    Advance goals of care discussion: Pt is Full code.  Family Communication: no family was present at bedside, at the time of interview.   Data Reviewed: I have personally reviewed and interpreted daily labs, tele strips, imaging. Potassium still elevated.  WBC elevated as well.  Hemoglobin has a gradual downward trend.  Physical Exam:  General: Appear in mild distress, no Rash; Oral Mucosa Clear, moist. no Abnormal Neck  Mass Or lumps, Conjunctiva normal  Cardiovascular: S1 and S2 Present, no Murmur, Respiratory: good respiratory effort, Bilateral Air entry present and CTA, no Crackles, no wheezes Abdomen: Bowel Sound present, Soft and mild  tenderness Extremities: no Pedal edema Neurology: alert and oriented to time, place, and person affect appropriate. no new focal deficit Gait not checked due to patient safety concerns   Vitals:   04/14/21 1630 04/14/21 1633 04/14/21 1645 04/14/21 1751  BP: (!)  123/50 112/65 121/67 (!) 101/37  Pulse: 83 80 80 86  Resp:  16  18  Temp:  97.7 F (36.5 C)  98 F (36.7 C)  TempSrc:  Oral  Oral  SpO2:  98%  95%  Weight:  65.9 kg    Height:        Disposition:  Status is: Inpatient  Remains inpatient appropriate because:IV treatments appropriate due to intensity of illness or inability to take PO  Dispo: The patient is from: Home              Anticipated d/c is to: Home              Patient currently is not medically stable to d/c.   Difficult to place patient No  Time spent: 35 minutes. I reviewed all nursing notes, pharmacy notes, vitals, pertinent old records. I have discussed plan of care as described above with RN.  Author: Berle Mull, MD [facility] 04/14/2021 7:25 PM  To reach On-call, see care teams to locate the attending and reach out via [email or web address]. Between 7PM-7AM, please contact night-coverage If you still have difficulty reaching the attending provider, please page the [facility] (Director on Call) for [facility] on amion for assistance.

## 2021-04-14 NOTE — Progress Notes (Signed)
Pharmacy Antibiotic Note  [name] is a 75 y.o. female admitted on 04/13/2021 with  intraabdominal infection .  Pharmacy has been consulted for Unasyn dosing.  Plan: Unasyn 3g IV q 24 hrs.  Give after HD on HD days.  Height: 4\' 8"  (142.2 cm) Weight: 68.1 kg (150 lb 2.1 oz) IBW/kg (Calculated) : 36.3  Temp (24hrs), Avg:98.4 F (36.9 C), Min:98.1 F (36.7 C), Max:98.8 F (37.1 C)  Recent Labs  Lab 04/13/21 1608 04/13/21 1709 04/13/21 1733 04/13/21 1738 04/14/21 0044  WBC 15.3*  --   --   --  17.8*  CREATININE 10.12*  --  10.30*  --  9.90*  LATICACIDVEN  --  1.9  --  2.1*  --     Estimated Creatinine Clearance: 3.7 mL/min (A) (by C-G formula based on SCr of 9.9 mg/dL (H)).    No Known Allergies   Thank you for allowing pharmacy to be a part of this patient's care.  [place] Crane, Roylene Reason, BCCP Clinical Pharmacist  04/14/2021 5:02 PM   [facility] pharmacy phone numbers are listed on [email or web address]

## 2021-04-14 NOTE — Plan of Care (Signed)
  Problem: Education: Goal: Knowledge of General Education information will improve Description: Including pain rating scale, medication(s)/side effects and non-pharmacologic comfort measures Outcome: Progressing   Problem: Clinical Measurements: Goal: Respiratory complications will improve Outcome: Progressing   

## 2021-04-14 NOTE — Consult Note (Signed)
[facility] Renal Consultation Note    Indication for Consultation:  Management of ESRD/hemodialysis; anemia, hypertension/volume and secondary hyperparathyroidism  HYI:FOYDXA, Nicole Kindred, MD  HPI: [name] is a 75 y.o. female with ESRD on HD TTS at [facility].  Past medical history significant for T2DM, COPD, Hx GI bleed, large hiatal hernia A flutter on Eliquis, OSA and CHF.  Recent admission 8/9-8/14 for GIB with EGD showing gastritis w/superficial erosions and large hiatal hernia as risk for volvulus.    Patient presented to ED 1 day ago for abdominal pain, nausea, vomiting and diarrhea x5 days.  Reports she has not been able to keep anything down for the last 3 days.  States she missed her last 2 dialysis due to being sick.  Admits to intermittent shortness of breath, no different than baseline.  Denies CP, orthopnea, headache, fever and chills.  Admits to edema, weakness and fatigue.  Has not been able to ambulate around the house as she normally does. Denies bleeding, melena, hematochezia, hemoptysis, and hematemesis.    Pertinent findings during work up include K 6.2, lactic acid 2.1, SCr 10.3, negative respiratory panel, CXR with no acute findings and CT abdomen with "Acute uncomplicated diverticulitis involving the distal descending colon through the rectosigmoid junction. No perforation, fluid collection, or abscess. And increasing linear sclerosis within the central aspect of the L1 vertebral body, worrisome for developing nondisplaced insufficiency fracture."  Patient has been admitted for further evaluation and management.   Past Medical History:  Diagnosis Date   Blood transfusion without reported diagnosis    CAD (coronary artery disease)    Nonobstructive at cardiac catheterization 2000   Cataract    Cervical cancer ([place]) 1978   CHF (congestive heart failure) (HCC)    Chronic back pain    Chronic kidney disease    Class 2 obesity with body mass index (BMI)  of 35 to 39.9 without comorbidity    Complication of anesthesia    hard to awaken with one back surgery - years ago, no problem since   COPD (chronic obstructive pulmonary disease) ([place])    Degenerative disc disease    DM (diabetes mellitus), type 2 with renal complications ([place])    Dyslipidemia    Dyspnea    "due to COPD"   Dysrhythmia    a-fib   ESRD (end stage renal disease) on dialysis ([place])    TTHS- [address]   GERD (gastroesophageal reflux disease)    Gout    Hiatal hernia 07/27/2013   History of diverticulitis of colon    History of hiatal hernia    HTN (hypertension)    09/09/20 not currently on medication, is on medication for hypotension   Hypotension    Iron deficiency anemia    Irritable bowel syndrome    Lumbar radiculopathy    Mixed hyperlipidemia    Moderate major depression, 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 States this was cervical cancer   Oxygen deficiency    room air   PAF (paroxysmal atrial fibrillation) (HCC)    Pneumonia    Sleep apnea    bipap - oxygen 2 l to bipap.   Type 2 diabetes mellitus ([place])    Vitamin B deficiency 12/25/2009   Vitamin B12 deficiency    Past Surgical History:  Procedure Laterality Date   ABDOMINAL HYSTERECTOMY  APPLICATION OF WOUND VAC Right 01/15/2021   Procedure: APPLICATION OF WOUND VAC;  Surgeon: Cherre Robins, MD;  Location: [place];  Service: Vascular;  Laterality: Right;   AV FISTULA PLACEMENT Left 08/05/2019   Procedure: ARTERIOVENOUS (AV) FISTULA CREATION LEFT ARM;  Surgeon: Waynetta Sandy, MD;  Location: [place];  Service: Vascular;  Laterality: Left;   AV FISTULA PLACEMENT Left 09/11/2020   Procedure: INSERTION OF ARTERIOVENOUS (AV) GORE-TEX GRAFT ARM LEFT;  Surgeon: Cherre Robins, MD;  Location: [place];  Service: Vascular;  Laterality: Left;   AV FISTULA PLACEMENT Right 11/29/2020    Procedure: Creation of Arteriovenous Fistula Right Upper Arm;  Surgeon: Rosetta Posner, MD;  Location: [place];  Service: Vascular;  Laterality: Right;   [place] Left 10/05/2019   Procedure: SECOND STAGE LEFT BASCILIC VEIN TRANSPOSITION;  Surgeon: Waynetta Sandy, MD;  Location: [place];  Service: Vascular;  Laterality: Left;   [place] Right 01/15/2021   Procedure: RIGHT UPPER ARM SECOND STAGE [place];  Surgeon: Cherre Robins, MD;  Location: [place];  Service: Vascular;  Laterality: Right;  PERIPHERAL NERVE BLOCK   Benign breast cysts     BIOPSY  02/26/2021   Procedure: BIOPSY;  Surgeon: Jackquline Denmark, MD;  Location: [facility] ENDOSCOPY;  Service: Endoscopy;;   CHOLECYSTECTOMY     COLONOSCOPY  10/01/2006   SLF:Pan colonic diverticulosis and moderate internal hemorrhoids/ Otherwise no polyps, masses, inflammatory changes or AVMs/   COLONOSCOPY  2011   SLF: pancolonic diverticulosis, large internal hemorrhoids   COLONOSCOPY N/A 01/26/2016   Procedure: COLONOSCOPY;  Surgeon: [name], MD;  [facility];  Service: Endoscopy;  Laterality: N/A;  830    COLONOSCOPY WITH PROPOFOL N/A 02/10/2020   Procedure: COLONOSCOPY WITH PROPOFOL;  Surgeon: Daneil Dolin, MD;  [facility];  Service: Endoscopy;  Laterality: N/A;  10:45am   ESOPHAGEAL DILATION  02/26/2021   Procedure: ESOPHAGEAL DILATION;  Surgeon: Jackquline Denmark, MD;  Location: [facility] ENDOSCOPY;  Service: Endoscopy;;   ESOPHAGOGASTRODUODENOSCOPY  11/19/2006   SLF: Large hiatal hernia without evidence of [place] ulcers/. Distal esophageal stricture, which allowed the gastroscope to pass without resistance.  A 16 mm Sav[place] later passed with mild resistance/ Normal stomach.sb bx negative   ESOPHAGOGASTRODUODENOSCOPY  10/01/2006   CVE:LFYBO hiatal hernia.  Distal esophagus without evidence of   erythema, ulceration or Barrett's esophagus    ESOPHAGOGASTRODUODENOSCOPY  2011   SLF: large hh, distal esophageal web narrowing to 81mm s/p dilation to 51mm   ESOPHAGOGASTRODUODENOSCOPY N/A 08/06/2013   SLF: 1. Stricture at the gastroesophageal junction 2. large hiatal hernia. 3. Mild erosive gastritis.   ESOPHAGOGASTRODUODENOSCOPY (EGD) WITH PROPOFOL N/A 07/19/2019   rourk: Mild erosive reflux esophagitis.  Mild Schatzki ring status post dilation.  Large hiatal hernia with at least one half of the stomach above the diaphragm.  Gastric mucosa erythematous.   ESOPHAGOGASTRODUODENOSCOPY (EGD) WITH PROPOFOL N/A 02/26/2021   Procedure: ESOPHAGOGASTRODUODENOSCOPY (EGD) WITH PROPOFOL;  Surgeon: Jackquline Denmark, MD;  Location: [facility] END[facility]COPY;  Service: Endoscopy;  Laterality: N/A;   GIVENS CAPSULE STUDY N/A 08/06/2013   INCOMPLETE-SMALL BOWLE ULCERS   IR FLUORO GUIDE CV LINE LEFT  07/29/2019   IR US GUIDE VASC ACCESS LEFT  07/29/2019   KNEE SURGERY Right    PARTIAL HYSTERECTOMY  1978   PORT-A-CATH REMOVAL Right 11/29/2020   Procedure: REMOVAL OF RIGHT CHEST PORT;  Surgeon: Rosetta Posner, MD;  [facility]: [facility];  Service: Vascular;  Laterality: Right;   small bowel capsule  2008   negative   SPINE SURGERY     TONSILLECTOMY AND ADENOIDECTOMY     Two back surgeries/fusion     UMBILICAL HERNIA REPAIR  2010   UPPER EXTREMITY VENOGRAPHY N/A 11/22/2020   Procedure: UPPER EXTREMITY VENOGRAPHY;  Surgeon: Cherre Robins, MD;  Location: [place] [facility];  Service: Cardiovascular;  Laterality: N/A;   Family History  Problem Relation Age of Onset   Colon cancer Brother        diagnosed age 68. Living.    Ulcers Sister    Diabetes Sister    Heart attack Sister    Kidney failure Sister    Stroke Sister    Ulcers Mother    Diabetes Mother    Heart attack Mother    Stroke Mother    Asthma Mother    Heart disease Mother    Cervical cancer Mother    Heart attack Brother    Heart disease Brother    Asthma Sister    Diabetes Brother    Stroke Maternal  Grandmother    Heart attack Maternal Grandmother    Heart attack Other    Early death Father        MVA in his 8s   Social History:  reports that she quit smoking about 59 years ago. Her smoking use included cigarettes. She started smoking about 60 years ago. She has a 1.00 pack-year smoking history. She has never used smokeless tobacco. She reports that she does not drink alcohol and does not use drugs. No Known Allergies Prior to Admission medications   Medication Sig Start Date End Date Taking? Authorizing Provider  acetaminophen (TYLENOL) 325 MG tablet Take 650 mg by mouth daily as needed for headache (pain).   Yes [provider]  allopurinol (ZYLOPRIM) 100 MG tablet Take 1 tablet (100 mg total) by mouth at bedtime. 03/18/21  Yes Aline August, MD  amiodarone (PACERONE) 200 MG tablet Take 1 tablet (200 mg total) by mouth in the morning. 01/15/21  Yes Dagoberto Ligas, PA-C  apixaban (ELIQUIS) 2.5 MG TABS tablet Take 2.5 mg by mouth 2 (two) times daily.   Yes [provider]  Biotin 5000 MCG TABS Take 5,000 mcg by mouth in the morning.   Yes [provider]  Camphor-Menthol-Methyl Sal (SALONPAS) 3.08-10-08 % PTCH Place 1 patch onto the skin daily as needed (pain).   Yes [provider]  Carboxymethylcellul-Glycerin (CLEAR EYES FOR DRY EYES) 1-0.25 % SOLN Place 1 drop into both eyes daily as needed (dry eys).   Yes [provider]  Cholecalciferol (VITAMIN D3) 50 MCG (2000 UT) TABS Take 2,000 Units by mouth in the morning.   Yes [provider]  esomeprazole (NEXIUM) 40 MG capsule Take 1 capsule (40 mg total) by mouth 2 (two) times daily before a meal. 02/28/21  Yes Lavina Hamman, MD  ferric citrate (AURYXIA) 1 GM 210 MG(Fe) tablet Take 210 mg by mouth 3 (three) times daily with meals.   Yes [provider]  Fluticasone-Umeclidin-Vilant (TRELEGY ELLIPTA) 100-62.5-25 MCG/INH AEPB Inhale 1 puff into the lungs daily at 12 noon.   Yes  [provider]  gabapentin (NEURONTIN) 100 MG capsule Take 100 mg by mouth at bedtime. 12/29/19  Yes [provider]  iron sucrose in sodium chloride 0.9 % 100 mL Iron Sucrose (Venofer) 03/22/21 03/21/22 Yes [provider]  midodrine (PROAMATINE) 10 MG  tablet Take 1 tablet (10 mg total) by mouth 3 (three) times daily with meals. 03/18/21 04/17/21 Yes Aline August, MD  nitroGLYCERIN (NITROSTAT) 0.4 MG SL tablet Place 0.4 mg under the tongue every 5 (five) minutes x 3 doses as needed for chest pain. 12/07/19  Yes [provider]  promethazine (PHENERGAN) 12.5 MG tablet Take 1 tablet (12.5 mg total) by mouth every 8 (eight) hours as needed for nausea or vomiting. 03/08/21 04/13/21 Yes Carver, Elon Alas, DO  atorvastatin (LIPITOR) 40 MG tablet Take 40 mg by mouth daily in the afternoon. In the afternoon Patient not taking: Reported on 04/13/2021 01/15/21   Dagoberto Ligas, PA-C  OXYGEN Inhale 2 L into the lungs See admin instructions. Use every night and as needed during the day    [provider]  sucralfate (CARAFATE) 1 GM/10ML suspension Take 10 mLs (1 g total) by mouth 2 (two) times daily for 14 days. 03/18/21 04/01/21  Aline August, MD   Current Facility-Administered Medications  Medication Dose Route Frequency Provider Last Rate Last Admin   0.9 %  sodium chloride infusion  250 mL Intravenous PRN Rama, Venetia Maxon, MD       acetaminophen (TYLENOL) tablet 650 mg  650 mg Oral Daily PRN Rama, Venetia Maxon, MD   650 mg at 04/14/21 0149   allopurinol (ZYLOPRIM) tablet 100 mg  100 mg Oral QHS Rama, Venetia Maxon, MD   100 mg at 04/14/21 0139   amiodarone (PACERONE) tablet 200 mg  200 mg Oral q AM Rama, Venetia Maxon, MD   200 mg at 04/14/21 0947   apixaban (ELIQUIS) tablet 2.5 mg  2.5 mg Oral BID Rama, Venetia Maxon, MD   2.5 mg at 04/14/21 0947   atorvastatin (LIPITOR) tablet 40 mg  40 mg Oral Q1500 Rama, Venetia Maxon, MD   40 mg at 04/14/21 0139   Chlorhexidine Gluconate  Cloth 2 % PADS 6 each  6 each Topical Daily Rama, Venetia Maxon, MD   6 each at 04/14/21 0948   Chlorhexidine Gluconate Cloth 2 % PADS 6 each  6 each Topical Q0600 Ladanian Kelter, Ria Comment, PA   6 each at 04/14/21 0830   cholecalciferol (VITAMIN D3) tablet 2,000 Units  2,000 Units Oral Daily Rama, Venetia Maxon, MD   2,000 Units at 04/14/21 0947   ferric citrate (AURYXIA) tablet 210 mg  210 mg Oral TID WC Rama, Christina P, MD   210 mg at 04/14/21 1155   fluticasone furoate-vilanterol (BREO ELLIPTA) 100-25 MCG/INH 1 puff  1 puff Inhalation Daily Rama, Venetia Maxon, MD   1 puff at 04/14/21 0756   And   umeclidinium bromide (INCRUSE ELLIPTA) 62.5 MCG/INH 1 puff  1 puff Inhalation Daily Rama, Venetia Maxon, MD   1 puff at 04/14/21 0756   gabapentin (NEURONTIN) capsule 100 mg  100 mg Oral QHS Rama, Venetia Maxon, MD   100 mg at 04/14/21 0139   midodrine (PROAMATINE) tablet 10 mg  10 mg Oral TID WC Rama, Venetia Maxon, MD   10 mg at 04/14/21 1155   Muscle Rub CREA 1 application  1 application Topical Daily PRN Rama, Venetia Maxon, MD       nitroGLYCERIN (NITROSTAT) SL tablet 0.4 mg  0.4 mg Sublingual Q5 Min x 3 PRN Rama, Venetia Maxon, MD       ondansetron (ZOFRAN) tablet 4 mg  4 mg Oral Q6H PRN Rama, Venetia Maxon, MD       Or   ondansetron (ZOFRAN) injection 4 mg  4 mg Intravenous Q6H PRN Rama, Venetia Maxon, MD       pantoprazole (PROTONIX) injection 40 mg  40 mg Intravenous QHS Rama, Venetia Maxon, MD   40 mg at 04/14/21 0100   polyvinyl alcohol (LIQUIFILM TEARS) 1.4 % ophthalmic solution 1 drop  1 drop Both Eyes Daily PRN Rama, Venetia Maxon, MD       sodium chloride flush (NS) 0.9 % injection 3 mL  3 mL Intravenous Q12H Rama, Venetia Maxon, MD   3 mL at 04/14/21 0948   sodium chloride flush (NS) 0.9 % injection 3 mL  3 mL Intravenous PRN Rama, Venetia Maxon, MD       sucralfate (CARAFATE) 1 GM/10ML suspension 1 g  1 g Oral BID Rama, Venetia Maxon, MD   1 g at 04/14/21 0828   Labs: Basic Metabolic Panel: Recent Labs  Lab  04/13/21 1608 04/13/21 1733 04/14/21 0044  NA 136 135 137  K 6.1* 6.2* 6.2*  CL 98 104 100  CO2 22  --  24  GLUCOSE 83 74 86  BUN 42* 39* 42*  CREATININE 10.12* 10.30* 9.90*  CALCIUM 8.4*  --  8.1*   Liver Function Tests: Recent Labs  Lab 04/13/21 1608  AST 14*  ALT 10  ALKPHOS 84  BILITOT 0.8  PROT 5.9*  ALBUMIN 2.0*   Recent Labs  Lab 04/13/21 1608  LIPASE 29    CBC: Recent Labs  Lab 04/13/21 1608 04/13/21 1733 04/14/21 0044  WBC 15.3*  --  17.8*  NEUTROABS 13.0*  --   --   HGB 10.3* 10.9* 9.2*  HCT 31.8* 32.0* 28.2*  MCV 89.1  --  88.4  PLT 279  --  248   Studies/Results: CT ABDOMEN PELVIS WO CONTRAST  Result Date: 04/13/2021 CLINICAL DATA:  Acute nonlocalized abdominal pain EXAM: CT ABDOMEN AND PELVIS WITHOUT CONTRAST TECHNIQUE: Multidetector CT imaging of the abdomen and pelvis was performed following the standard protocol without IV contrast. Unenhanced CT was performed per clinician order. Lack of IV contrast limits sensitivity and specificity, especially for evaluation of abdominal/pelvic solid viscera. COMPARISON:  03/17/2021 FINDINGS: Lower chest: No acute pleural or parenchymal lung disease. Large hiatal hernia is again identified unchanged. Hepatobiliary: Gallbladder is surgically absent. No gross liver abnormalities on this unenhanced exam. Pancreas: Unremarkable. No pancreatic ductal dilatation or surrounding inflammatory changes. Spleen: Normal in size without focal abnormality. Adrenals/Urinary Tract: Marked bilateral renal cortical atrophy compatible with end-stage renal disease. No urinary tract calculi or obstructive uropathy. The adrenals are unremarkable. Bladder is decompressed, limiting its evaluation. Stomach/Bowel: There is diffuse colonic diverticulosis, with with superimposed wall thickening involving the distal descending colon through the rectosigmoid junction, consistent with acute uncomplicated diverticulitis. No bowel obstruction or ileus.  Vascular/Lymphatic: Diffuse atherosclerosis throughout the aorta and its distal branches unchanged. No pathologic adenopathy within the abdomen or pelvis. Reproductive: Status post hysterectomy. No adnexal masses. Other: No free fluid or free gas.  No abdominal wall hernia. Musculoskeletal: There is increasing linear sclerosis within the central aspect of the L1 vertebral body, worrisome for developing insufficiency fracture. There is no significant loss of height or visible fracture line since prior study. Follow-up nonemergent MRI or bone scan could be considered if patient has clinical symptoms of lower back pain. Stable postsurgical changes at L4-5. No other acute bony abnormalities. Reconstructed images demonstrate no additional findings. IMPRESSION: 1. Acute uncomplicated diverticulitis involving the distal descending colon through the rectosigmoid junction. No perforation, fluid collection, or abscess. 2. Increasing linear sclerosis  within the central aspect of the L1 vertebral body, worrisome for developing nondisplaced insufficiency fracture. If patient has symptoms localizing to this region, follow-up nonemergent MRI or bone scan could be considered. 3. Stable hiatal hernia. 4.  Aortic Atherosclerosis (ICD10-I70.0). Electronically Signed   By: Randa Ngo M.D.   On: 04/13/2021 17:43   DG Chest 2 View  Result Date: 04/13/2021 CLINICAL DATA:  Nausea EXAM: CHEST - 2 VIEW COMPARISON:  03/15/2021, CT 03/17/2021, radiograph 04/24/2020, 07/19/2020 FINDINGS: Left-sided central venous catheter tip over the SVC. No focal consolidation, pleural effusion or pneumothorax. Stable cardiomediastinal silhouette with large hiatal hernia contributing to right basilar density. No pneumothorax. IMPRESSION: No active cardiopulmonary disease.  Large hiatal hernia Electronically Signed   By: Donavan Foil M.D.   On: 04/13/2021 17:26    ROS: All others negative except those listed in HPI.  Physical Exam: Vitals:    04/14/21 1258 04/14/21 1303 04/14/21 1330 04/14/21 1400  BP: (!) 121/48 (!) 145/73 (!) 140/52 (!) 139/52  Pulse: 80 67 74 75  Resp: 17   16  Temp: 98.8 F (37.1 C)     TempSrc: Oral     SpO2:      Weight:      Height:         General: chronically ill appearing female in NAD Head: NCAT sclera not icteric MMM Neck: Supple. No lymphadenopathy Lungs: mostly CTA bilaterally. No wheeze, rales or rhonchi. Breathing is unlabored. Heart: RRR. No murmur, rubs or gallops.  Abdomen: soft, +tenderness, +BS Lower extremities:trace edema in upper and lower extremities  Neuro: AAOx3. Moves all extremities spontaneously. Psych:  Responds to questions appropriately with a normal affect. Dialysis Access: TDC, RU AVF - no bruit/thrill  Dialysis Orders:  TTS - [facility]  4hrs, BFR 400, DFR 800,  EDW 77, 2K/ 2.5Ca  Access: TDC  Heparin none Mircera 225 mcg q2wks - last 65mcg on 7/14 Calcitriol 1.25 mcg PO qHD  Assessment/Plan:  Intractable n/v/d - in setting of diverticulitis and missed 1 week HD.  Plan for HD today.   Diverticulitis - recurrent, noted on CT scan.  Management per primary. Hyperkalemia - K 6.2, treated medically.  Plan for HD today.    ESRD -  on HD TTS.  Plan for HD today per regular schedule.    Hypertension/volume  - Blood pressure variable.  On midodrine. CXR with no indication of volume overload.  Trace edema on exam.  Plan for UF 1.5-2L today.    Anemia of CKD - Hgb 9.2. Will order aranesp 51mcg qwk  Secondary Hyperparathyroidism -  Correct Ca ok.  Will check phos, continue binders, VDRA  Nutrition - renal diet w/fluid restrictions once advanced.  DMT2 - per primary COPD  Jen Mow, PA-C [facility] 04/14/2021, 2:25 PM

## 2021-04-14 NOTE — Progress Notes (Addendum)
Trilogy home BIPAP at bedside. Machine/cords inspected by this RT for damage/frays. BIPAP plugged in, 2L O2 bled in, noted adequate water amount for humidification, within pt's reach and ready for use. Advised pt to notify for RT if any further assistance is needed with machine.

## 2021-04-15 LAB — CBC WITH DIFFERENTIAL/PLATELET
Abs Immature Granulocytes: 0.55 10*3/uL — ABNORMAL HIGH (ref 0.00–0.07)
Basophils Absolute: 0.1 10*3/uL (ref 0.0–0.1)
Basophils Relative: 1 %
Eosinophils Absolute: 0.1 10*3/uL (ref 0.0–0.5)
Eosinophils Relative: 1 %
HCT: 26.5 % — ABNORMAL LOW (ref 36.0–46.0)
Hemoglobin: 8.4 g/dL — ABNORMAL LOW (ref 12.0–15.0)
Immature Granulocytes: 4 %
Lymphocytes Relative: 8 %
Lymphs Abs: 1 10*3/uL (ref 0.7–4.0)
MCH: 28.7 pg (ref 26.0–34.0)
MCHC: 31.7 g/dL (ref 30.0–36.0)
MCV: 90.4 fL (ref 80.0–100.0)
Monocytes Absolute: 1.1 10*3/uL — ABNORMAL HIGH (ref 0.1–1.0)
Monocytes Relative: 8 %
Neutro Abs: 10.4 10*3/uL — ABNORMAL HIGH (ref 1.7–7.7)
Neutrophils Relative %: 78 %
Platelets: 223 10*3/uL (ref 150–400)
RBC: 2.93 MIL/uL — ABNORMAL LOW (ref 3.87–5.11)
RDW: 15.8 % — ABNORMAL HIGH (ref 11.5–15.5)
WBC: 13.3 10*3/uL — ABNORMAL HIGH (ref 4.0–10.5)
nRBC: 0 % (ref 0.0–0.2)

## 2021-04-15 LAB — RENAL FUNCTION PANEL
Albumin: 1.5 g/dL — ABNORMAL LOW (ref 3.5–5.0)
Anion gap: 6 (ref 5–15)
BUN: 14 mg/dL (ref 8–23)
CO2: 27 mmol/L (ref 22–32)
Calcium: 7.7 mg/dL — ABNORMAL LOW (ref 8.9–10.3)
Chloride: 103 mmol/L (ref 98–111)
Creatinine, Ser: 4.89 mg/dL — ABNORMAL HIGH (ref 0.44–1.00)
GFR, Estimated: 9 mL/min — ABNORMAL LOW (ref >60)
Glucose, Bld: 84 mg/dL (ref 70–99)
Phosphorus: 3 mg/dL (ref 2.5–4.6)
Potassium: 4.3 mmol/L (ref 3.5–5.1)
Sodium: 136 mmol/L (ref 135–145)

## 2021-04-15 NOTE — Progress Notes (Addendum)
[facility] Progress Note   Subjective:   Patient seen and examined at bedside.  Feeling a little better this AM.  Some improvement in n/v.  Diarrhea about the same.  Denies SOB, orthopnea, and n/v/d.    Discussed her AVF has no bruit/thrill noted in it today.  She had seen vascular a few weeks ago and was told she needed an intervention due to stenosis and had not made up her mind as to whether she wanted to have it done.  Frustrated with history of failed accesses.  Would like to know what other options she may have.      Objective Vitals:   04/14/21 2028 04/15/21 0450 04/15/21 0734 04/15/21 0816  BP: (!) 102/36 (!) 105/36  (!) 117/55  Pulse: 81 74  80  Resp:    19  Temp: 98.4 F (36.9 C) 98.2 F (36.8 C)  98.4 F (36.9 C)  TempSrc: Oral Oral  Oral  SpO2: 94% 100% 95% 97%  Weight:      Height:       Physical Exam General:chronically ill appearing female in NAD Heart:RRR, no mrg Lungs:RRR, nml WOB on RA Abdomen:soft, +tenderness, ND Extremities:trace LE edema, 1+ edema in LUE Dialysis Access: [facility], RU AVF no bruit/thrill   Filed Weights   04/14/21 0112 04/14/21 1258 04/14/21 1633  Weight: 70.6 kg 68.1 kg 65.9 kg    Intake/Output Summary (Last 24 hours) at 04/15/2021 0926 Last data filed at 04/14/2021 1800 Gross per 24 hour  Intake 1040 ml  Output 2000 ml  Net -960 ml    Additional Objective Labs: Basic Metabolic Panel: Recent Labs  Lab 04/13/21 1608 04/13/21 1733 04/14/21 0044 04/15/21 0242  NA 136 135 137 136  K 6.1* 6.2* 6.2* 4.3  CL 98 104 100 103  CO2 22  --  24 27  GLUCOSE 83 74 86 84  BUN 42* 39* 42* 14  CREATININE 10.12* 10.30* 9.90* 4.89*  CALCIUM 8.4*  --  8.1* 7.7*  PHOS  --   --   --  3.0   Liver Function Tests: Recent Labs  Lab 04/13/21 1608 04/15/21 0242  AST 14*  --   ALT 10  --   ALKPHOS 84  --   BILITOT 0.8  --   PROT 5.9*  --   ALBUMIN 2.0* 1.5*   Recent Labs  Lab 04/13/21 1608  LIPASE 29   CBC: Recent  Labs  Lab 04/13/21 1608 04/13/21 1733 04/14/21 0044 04/15/21 0242  WBC 15.3*  --  17.8* 13.3*  NEUTROABS 13.0*  --   --  10.4*  HGB 10.3* 10.9* 9.2* 8.4*  HCT 31.8* 32.0* 28.2* 26.5*  MCV 89.1  --  88.4 90.4  PLT 279  --  248 223    Studies/Results: CT ABDOMEN PELVIS WO CONTRAST  Result Date: 04/13/2021 CLINICAL DATA:  Acute nonlocalized abdominal pain EXAM: CT ABDOMEN AND PELVIS WITHOUT CONTRAST TECHNIQUE: Multidetector CT imaging of the abdomen and pelvis was performed following the standard protocol without IV contrast. Unenhanced CT was performed per clinician order. Lack of IV contrast limits sensitivity and specificity, especially for evaluation of abdominal/pelvic solid viscera. COMPARISON:  03/17/2021 FINDINGS: Lower chest: No acute pleural or parenchymal lung disease. Large hiatal hernia is again identified unchanged. Hepatobiliary: Gallbladder is surgically absent. No gross liver abnormalities on this unenhanced exam. Pancreas: Unremarkable. No pancreatic ductal dilatation or surrounding inflammatory changes. Spleen: Normal in size without focal abnormality. Adrenals/Urinary Tract: Marked bilateral renal cortical atrophy compatible  with end-stage renal disease. No urinary tract calculi or obstructive uropathy. The adrenals are unremarkable. Bladder is decompressed, limiting its evaluation. Stomach/Bowel: There is diffuse colonic diverticulosis, with with superimposed wall thickening involving the distal descending colon through the rectosigmoid junction, consistent with acute uncomplicated diverticulitis. No bowel obstruction or ileus. Vascular/Lymphatic: Diffuse atherosclerosis throughout the aorta and its distal branches unchanged. No pathologic adenopathy within the abdomen or pelvis. Reproductive: Status post hysterectomy. No adnexal masses. Other: No free fluid or free gas.  No abdominal wall hernia. Musculoskeletal: There is increasing linear sclerosis within the central aspect of the  L1 vertebral body, worrisome for developing insufficiency fracture. There is no significant loss of height or visible fracture line since prior study. Follow-up nonemergent MRI or bone scan could be considered if patient has clinical symptoms of lower back pain. Stable postsurgical changes at L4-5. No other acute bony abnormalities. Reconstructed images demonstrate no additional findings. IMPRESSION: 1. Acute uncomplicated diverticulitis involving the distal descending colon through the rectosigmoid junction. No perforation, fluid collection, or abscess. 2. Increasing linear sclerosis within the central aspect of the L1 vertebral body, worrisome for developing nondisplaced insufficiency fracture. If patient has symptoms localizing to this region, follow-up nonemergent MRI or bone scan could be considered. 3. Stable hiatal hernia. 4.  Aortic Atherosclerosis (ICD10-I70.0). Electronically Signed   By: Randa Ngo M.D.   On: 04/13/2021 17:43   DG Chest 2 View  Result Date: 04/13/2021 CLINICAL DATA:  Nausea EXAM: CHEST - 2 VIEW COMPARISON:  03/15/2021, CT 03/17/2021, radiograph 04/24/2020, 07/19/2020 FINDINGS: Left-sided central venous catheter tip over the SVC. No focal consolidation, pleural effusion or pneumothorax. Stable cardiomediastinal silhouette with large hiatal hernia contributing to right basilar density. No pneumothorax. IMPRESSION: No active cardiopulmonary disease.  Large hiatal hernia Electronically Signed   By: Donavan Foil M.D.   On: 04/13/2021 17:26    Medications:  sodium chloride     ampicillin-sulbactam (UNASYN) IV 3 g (04/14/21 2321)    allopurinol  100 mg Oral QHS   amiodarone  200 mg Oral q AM   apixaban  2.5 mg Oral BID   atorvastatin  40 mg Oral Q1500   Chlorhexidine Gluconate Cloth  6 each Topical Daily   Chlorhexidine Gluconate Cloth  6 each Topical Q0600   cholecalciferol  2,000 Units Oral Daily   darbepoetin (ARANESP) injection - DIALYSIS  60 mcg Intravenous Q Sat-HD    ferric citrate  210 mg Oral TID WC   fluticasone furoate-vilanterol  1 puff Inhalation Daily   And   umeclidinium bromide  1 puff Inhalation Daily   gabapentin  100 mg Oral QHS   midodrine  10 mg Oral TID WC   pantoprazole (PROTONIX) IV  40 mg Intravenous QHS   sodium chloride flush  3 mL Intravenous Q12H   sucralfate  1 g Oral BID    Dialysis Orders: TTS - NW  4hrs, BFR 400, DFR 800,  EDW 77, 2K/ 2.5Ca   Access: TDC  Heparin none Mircera 225 mcg q2wks - last 49mcg on 7/14 Calcitriol 1.25 mcg PO qHD   Assessment/Plan:  Intractable n/v/d - in setting of diverticulitis and missed 1 week HD.  Slightly improved today.  On IV antibiotics.  Diverticulitis - recurrent, noted on CT scan.  On IV ABX. Management per primary. Thrombosed AVF - Multiple failed accesses in the past, would like to discuss options with Vascular.  Will consult in AM Monday.  Hyperkalemia - Resolved. Treated medically and with HD.  Last K  4.2.   ESRD -  on HD TTS.  Next HD on 9/13.   Hypertension/volume  - Blood pressure soft/normal.  On midodrine. CXR with no indication of volume overload.  Trace edema on exam.  UF as tolerated.   Anemia of CKD - Hgb trending down, last 8.4. Aranesp 17mcg given with HD yesterday.   Secondary Hyperparathyroidism -  Correct Ca ok and phos ok. Continue binders, VDRA  Nutrition - renal diet w/fluid restrictions once advanced.  DMT2 - per primary COPD  Jen Mow, PA-C [facility] 04/15/2021,9:26 AM  LOS: 1 day

## 2021-04-15 NOTE — Progress Notes (Signed)
[facility] Progress Note  Patient: [name]    YSA:[id number]  DOA: 04/13/2021     Date of Service: the patient was seen and examined on 04/15/2021  Brief hospital course: Past medical history of ESRD on HD TTS, recurrent GI bleed, hiatal hernia, obesity.  Presents with complaints of abdominal pain.  Found to have diverticulitis. Currently plan is continue current antibiotic therapy.  Monitor for improvement in diet tolerance  Subjective: Abdominal pain improving.  Nausea resolved.  No vomiting.  Diarrhea resolved.  Assessment and Plan: Intractable nausea and vomiting in the setting of diverticulitis and end-stage renal disease Nausea likely from a combination of diverticulitis and uremia. Currently improving but still symptomatic. Continue with IV antibiotics.  We will advance her diet from clear liquid diet to full liquid diet and further to soft diet as tolerated.  Transition to oral antibiotic likely tomorrow. Monitor.   Active Problems: Hyperkalemia Treated with Lokelma. Monitor on telemetry. Treated with HD.     Diverticulitis with generalized abdominal pain Placed on empiric Flagyl and Rocephin.  CT scan was reviewed. Switch to Unasyn.    Iron deficiency anemia due to chronic blood loss Continue iron as tolerated.     OSA (obstructive sleep apnea) Continue BiPAP at night.     HLD (hyperlipidemia) Continue Lipitor.     Hiatal hernia with GERD and esophagitis Will place on PPI therapy and Carafate.     Chronic obstructive pulmonary disease ([place]) Continue Trelegy Ellipta.     Chronic diastolic CHF (congestive heart failure) (HCC) Appears compensated.     Chronic gout 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 Continue allopurinol.     Paroxysmal atrial fibrillation (HCC) Continue amiodarone and Eliquis.     ESRD (end stage renal disease) on dialysis [facility]) Notify renal in the morning of need for hemodialysis.     Secondary hyperparathyroidism of  renal origin ([place])     Obesity Body mass index is 35.65 kg/m.  Scheduled Meds:  allopurinol  100 mg Oral QHS   amiodarone  200 mg Oral q AM   apixaban  2.5 mg Oral BID   atorvastatin  40 mg Oral Q1500   Chlorhexidine Gluconate Cloth  6 each Topical Daily   Chlorhexidine Gluconate Cloth  6 each Topical Q0600   cholecalciferol  2,000 Units Oral Daily   darbepoetin (ARANESP) injection - DIALYSIS  60 mcg Intravenous Q Sat-HD   ferric citrate  210 mg Oral TID WC   fluticasone furoate-vilanterol  1 puff Inhalation Daily   And   umeclidinium bromide  1 puff Inhalation Daily   gabapentin  100 mg Oral QHS   midodrine  10 mg Oral TID WC   pantoprazole (PROTONIX) IV  40 mg Intravenous QHS   sodium chloride flush  3 mL Intravenous Q12H   sucralfate  1 g Oral BID   Continuous Infusions:  sodium chloride     ampicillin-sulbactam (UNASYN) IV 3 g (04/15/21 1943)   PRN Meds: sodium chloride, acetaminophen, Muscle Rub, nitroGLYCERIN, ondansetron **OR** ondansetron (ZOFRAN) IV, polyvinyl alcohol, sodium chloride flush  Body mass index is 32.57 kg/m.        DVT Prophylaxis:   apixaban (ELIQUIS) tablet 2.5 mg Start: 04/13/21 2315 apixaban (ELIQUIS) tablet 2.5 mg    Advance goals of care discussion: Pt is Full code.  Family Communication: no family was present at bedside, at the time of interview.   Data Reviewed: I have personally reviewed and interpreted daily labs, tele strips, imaging. Electrolytes stable.  Leukocytosis improving.  Hemoglobin gradually trended down.  Physical Exam:  General: Appear in mild distress, no Rash; Oral Mucosa Clear, moist. no Abnormal Neck Mass Or lumps, Conjunctiva normal  Cardiovascular: S1 and S2 Present, no Murmur, Respiratory: good respiratory effort, Bilateral Air entry present and CTA, no Crackles, no wheezes Abdomen: Bowel Sound present, Soft and no tenderness Extremities: no Pedal edema Neurology: alert and oriented to time, place, and  person affect appropriate. no new focal deficit Gait not checked due to patient safety concerns   Vitals:   04/15/21 0816 04/15/21 1523 04/15/21 1618 04/15/21 2017  BP: (!) 117/55 (!) 113/44 (!) 128/46 (!) 110/42  Pulse: 80 78 82 69  Resp: 19 17 19 17   Temp: 98.4 F (36.9 C) 98.3 F (36.8 C)  98.1 F (36.7 C)  TempSrc: Oral Oral  Oral  SpO2: 97% 94% 93% 90%  Weight:      Height:        Disposition:  Status is: Inpatient  Remains inpatient appropriate because:IV treatments appropriate due to intensity of illness or inability to take PO  Dispo: The patient is from: Home              Anticipated d/c is to: Home              Patient currently is not medically stable to d/c.   Difficult to place patient No  Time spent: 35 minutes. I reviewed all nursing notes, pharmacy notes, vitals, pertinent old records. I have discussed plan of care as described above with RN.  Author: Berle Mull, MD [facility] 04/15/2021 8:50 PM  To reach On-call, see care teams to locate the attending and reach out via [email or web address]. Between 7PM-7AM, please contact night-coverage If you still have difficulty reaching the attending provider, please page the [facility] (Director on Call) for [facility] on amion for assistance.

## 2021-04-15 NOTE — Plan of Care (Signed)
  Problem: Safety: Goal: Ability to remain free from injury will improve Outcome: Progressing   

## 2021-04-15 NOTE — Progress Notes (Signed)
Visit made to patients room to discuss CPAP placement.  Patient states she will self administer when she is ready for bed.

## 2021-04-16 MED ORDER — ALTEPLASE 2 MG IJ SOLR: 2.0000 mg | Freq: Once | INTRAMUSCULAR | Status: DC | PRN

## 2021-04-16 MED ORDER — LIDOCAINE-PRILOCAINE 2.5-2.5 % EX CREA: 1.0000 "application " | TOPICAL_CREAM | CUTANEOUS | Status: DC | PRN

## 2021-04-16 MED ORDER — PENTAFLUOROPROP-TETRAFLUOROETH EX AERO: 1.0000 "application " | INHALATION_SPRAY | CUTANEOUS | Status: DC | PRN

## 2021-04-16 MED ORDER — LIDOCAINE HCL (PF) 1 % IJ SOLN: 5.0000 mL | INTRAMUSCULAR | Status: DC | PRN

## 2021-04-16 MED ORDER — DM-GUAIFENESIN ER 30-600 MG PO TB12
1.0000 | ORAL_TABLET | Freq: Two times a day (BID) | ORAL | Status: DC
  Administered 2021-04-16 – 2021-04-17 (×3): 1 via ORAL
  Filled 2021-04-16 (×3): qty 1

## 2021-04-16 MED ORDER — AMOXICILLIN-POT CLAVULANATE 500-125 MG PO TABS
1.0000 | ORAL_TABLET | Freq: Every evening | ORAL | Status: DC
  Administered 2021-04-16: 500 mg via ORAL
  Filled 2021-04-16 (×2): qty 1

## 2021-04-16 MED ORDER — HEPARIN SODIUM (PORCINE) 1000 UNIT/ML DIALYSIS
1000.0000 [IU] | INTRAMUSCULAR | Status: DC | PRN
  Administered 2021-04-17: 1000 [IU] via INTRAVENOUS_CENTRAL
  Filled 2021-04-16: qty 1

## 2021-04-16 MED ORDER — SODIUM CHLORIDE 0.9 % IV SOLN: 100.0000 mL | INTRAVENOUS | Status: DC | PRN

## 2021-04-16 MED ORDER — BENZONATATE 100 MG PO CAPS
100.0000 mg | ORAL_CAPSULE | Freq: Two times a day (BID) | ORAL | Status: DC
  Administered 2021-04-16 – 2021-04-17 (×3): 100 mg via ORAL
  Filled 2021-04-16 (×3): qty 1

## 2021-04-16 NOTE — NC FL2 (Signed)
[place] LEVEL OF CARE SCREENING TOOL     IDENTIFICATION  Patient Name: [name] Birthdate: [birth year] Sex: female Admission Date (Current Location): 04/13/2021  [facility] and [place] Number:  Herbalist and Address:  [facility] [place], [place] [address] [place], [place] [address]      Provider Number: [id number]  Attending Physician Name and Address:  Lavina Hamman, MD  Relative Name and Phone Number:       Current Level of Care: Hospital Recommended Level of Care: [place] Prior Approval Number:    Date Approved/Denied:   PASRR Number: [id number]  Discharge Plan: SNF    Current Diagnoses: Patient Active Problem List   Diagnosis Date Noted   Diverticulitis    Non-intractable vomiting    GI bleed 02/25/2021   Acute blood loss anemia 02/25/2021   Hypotension 02/25/2021   Anticoagulated    Presence of cardiac and vascular implant and graft, unspecified 11/14/2020   Diverticulitis of colon without hemorrhage 09/08/2020   Other disorders of phosphorus metabolism 08/31/2020   Acute respiratory failure due to COVID-19 [facility]) 07/20/2020   Acute respiratory failure with hypoxia ([place]) 07/20/2020   Allergy, unspecified, sequela 06/28/2020   Personal history of anaphylaxis 06/28/2020   Recurrent falls 04/25/2020   Fall 04/24/2020   ESRD (end stage renal disease) on dialysis [facility])    Essential hypertension    Dyslipidemia    DM (diabetes mellitus), type 2 with renal complications (HCC)    Class 2 obesity with body mass index (BMI) of 35 to 39.9 without comorbidity    OSA (obstructive sleep apnea)    COPD (chronic obstructive pulmonary disease) (HCC)    PAF (paroxysmal atrial fibrillation) ([place])    Acute respiratory distress 03/09/2020   Fluid overload, unspecified 03/09/2020   Hyperkalemia 03/06/2020   GERD with esophagitis 03/06/2020   Acute bacterial bronchitis 03/06/2020   Anemia in chronic kidney  disease 11/17/2019   Coagulation defect, unspecified ([place]) 11/17/2019   Pain, unspecified 11/17/2019   Pruritus, unspecified 11/17/2019   Secondary hyperparathyroidism of renal origin ([place]) 11/17/2019   Unspecified protein-calorie malnutrition ([place]) 11/17/2019   Loss of weight 10/22/2019   Goals of care, counseling/discussion 07/23/2019   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 07/21/2019   Paroxysmal atrial fibrillation ([place]) 07/21/2019   Encephalopathy 07/21/2019   Moderate major depression, single episode ([place]) 07/21/2019   Pressure ulcer 07/20/2019   Anorexia 07/10/2019   Weight gain with edema 07/02/2019   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 ([place]) 06/22/2019   Neuropathy due to type 2 diabetes mellitus ([place]) 06/22/2019   Mixed diabetic hyperlipidemia associated with type 2 diabetes mellitus ([place]) 06/22/2019   Chronic gout 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 06/22/2019   Quality of life palliative care encounter 06/19/2019   Atrial fibrillation with RVR (HCC)    Atrial flutter with rapid ventricular response ([place]) 06/16/2019   Dyspnea and respiratory abnormalities 06/01/2019   Acute on chronic respiratory failure with hypoxia and hypercapnia with respiratory acidosis 06/01/2019   Anasarca 05/31/2019   Nausea without vomiting 05/31/2019   Generalized abdominal pain 05/31/2019   History of colonic polyps 05/31/2019   Lymphedema of both lower extremities 04/28/2019   Cellulitis of finger of right hand 08/19/2018   Chronic respiratory failure with hypoxia ([place]) 07/13/2018   Acute renal failure superimposed on stage 3 chronic kidney disease ([place]) 07/13/2018   Chronic diastolic CHF (congestive  heart failure) ([place]) 07/13/2018   COPD (chronic obstructive pulmonary disease) ([place]) 07/13/2018   Flatulence 09/18/2017   IBS (irritable bowel syndrome) 04/03/2017   Acute on chronic diastolic CHF (congestive  heart failure) ([place]) 05/08/2016   COPD with acute exacerbation ([place]) 05/08/2016   Constipation 04/25/2016   Peripheral vertigo 06/27/2015   Intractable nausea and vomiting 03/29/2015   Port-A-Cath in place 10/06/2014   Chronic obstructive pulmonary disease ([place]) 04/30/2014   Obesity, Class III, BMI 40-49.9 (morbid obesity) ([place]) 04/30/2014   Hemorrhoids, internal, with bleeding 11/10/2013   Symptomatic anemia 08/18/2013   Other diseases of tongue 08/18/2013   Dark stools 07/27/2013   Chest pain 09/05/2012   Arthropathy 12/09/2011   Edema 12/09/2011   Gout 12/09/2011   Hiatal hernia with GERD and esophagitis    Obstructive chronic bronchitis without exacerbation ([place]) 12/26/2009   Hematochezia 12/26/2009   Dysphagia 12/26/2009   DM type 2 with diabetic peripheral neuropathy ([place]) 12/25/2009   Vitamin B deficiency 12/25/2009   Iron deficiency anemia due to chronic blood loss 12/25/2009   Benign essential HTN 12/25/2009   DEGENERATIVE DISC DISEASE 12/25/2009   OSA (obstructive sleep apnea) 12/25/2009   HLD (hyperlipidemia) 12/25/2009   Type 2 diabetes mellitus with ESRD (end-stage renal disease) ([place]) 12/25/2009   Narrowing of intervertebral disc space 12/25/2009    Orientation RESPIRATION BLADDER Height & Weight     Self, Time, Situation, Place  Normal Incontinent Weight: 145 lb 4.5 oz (65.9 kg) Height:  4\' 8"  (142.2 cm)  BEHAVIORAL SYMPTOMS/MOOD NEUROLOGICAL BOWEL NUTRITION STATUS      Incontinent Diet (See DC summary)  AMBULATORY STATUS COMMUNICATION OF NEEDS Skin   Extensive Assist Verbally Normal                       Personal Care Assistance Level of Assistance  Bathing, Feeding, Dressing Bathing Assistance: Maximum assistance Feeding assistance: Limited assistance Dressing Assistance: Maximum assistance     Functional Limitations Info             SPECIAL CARE FACTORS FREQUENCY  PT (By licensed PT), OT (By licensed OT)     PT Frequency: 5x a week OT  Frequency: 5x a week            Contractures Contractures Info: Not present    Additional Factors Info  Code Status, Allergies Code Status Info: Full Allergies Info: NKA           Current Medications (04/16/2021):  This is the current hospital active medication list Current Facility-Administered Medications  Medication Dose Route Frequency Provider Last Rate Last Admin   0.9 %  sodium chloride infusion  250 mL Intravenous PRN Rama, Venetia Maxon, MD       0.9 %  sodium chloride infusion  100 mL Intravenous PRN Tobie Poet E, NP       0.9 %  sodium chloride infusion  100 mL Intravenous PRN Adelfa Koh, NP       acetaminophen (TYLENOL) tablet 650 mg  650 mg Oral Daily PRN Rama, Venetia Maxon, MD   650 mg at 04/16/21 0949   allopurinol (ZYLOPRIM) tablet 100 mg  100 mg Oral QHS Rama, Venetia Maxon, MD   100 mg at 04/15/21 2106   alteplase (CATHFLO ACTIVASE) injection 2 mg  2 mg Intracatheter Once PRN Adelfa Koh, NP       amiodarone (PACERONE) tablet 200 mg  200 mg Oral q AM Rama, Venetia Maxon, MD   200  mg at 04/16/21 0949   amoxicillin-clavulanate (AUGMENTIN) 500-125 MG per tablet 500 mg  1 tablet Oral QPM Lavina Hamman, MD       apixaban Arne [place]) tablet 2.5 mg  2.5 mg Oral BID Rama, Venetia Maxon, MD   2.5 mg at 04/16/21 0942   atorvastatin (LIPITOR) tablet 40 mg  40 mg Oral Q1500 Rama, Venetia Maxon, MD   40 mg at 04/15/21 1718   benzonatate (TESSALON) capsule 100 mg  100 mg Oral BID Lavina Hamman, MD   100 mg at 04/16/21 1301   Chlorhexidine Gluconate Cloth 2 % PADS 6 each  6 each Topical Daily Rama, Venetia Maxon, MD   6 each at 04/16/21 0943   Chlorhexidine Gluconate Cloth 2 % PADS 6 each  6 each Topical Q0600 Penninger, Ria Comment, PA   6 each at 04/14/21 0830   cholecalciferol (VITAMIN D3) tablet 2,000 Units  2,000 Units Oral Daily Rama, Venetia Maxon, MD   2,000 Units at 04/16/21 6160   Darbepoetin Alfa (ARANESP) injection 60 mcg  60 mcg Intravenous Q Sat-HD  Penninger, Ria Comment, PA   60 mcg at 04/14/21 1633   dextromethorphan-guaiFENesin ([place] DM) 30-600 MG per 12 hr tablet 1 tablet  1 tablet Oral BID Lavina Hamman, MD   1 tablet at 04/16/21 1301   ferric citrate (AURYXIA) tablet 210 mg  210 mg Oral TID WC Rama, Venetia Maxon, MD   210 mg at 04/16/21 1301   fluticasone furoate-vilanterol (BREO ELLIPTA) 100-25 MCG/INH 1 puff  1 puff Inhalation Daily Rama, Venetia Maxon, MD   1 puff at 04/15/21 0734   And   umeclidinium bromide (INCRUSE ELLIPTA) 62.5 MCG/INH 1 puff  1 puff Inhalation Daily Rama, Venetia Maxon, MD   1 puff at 04/15/21 0734   gabapentin (NEURONTIN) capsule 100 mg  100 mg Oral QHS Rama, Venetia Maxon, MD   100 mg at 04/15/21 2105   heparin injection 1,000 Units  1,000 Units Dialysis PRN Adelfa Koh, NP       lidocaine (PF) (XYLOCAINE) 1 % injection 5 mL  5 mL Intradermal PRN Adelfa Koh, NP       lidocaine-prilocaine (EMLA) cream 1 application  1 application Topical PRN Adelfa Koh, NP       midodrine (PROAMATINE) tablet 10 mg  10 mg Oral TID WC Rama, Venetia Maxon, MD   10 mg at 04/16/21 1301   Muscle Rub CREA 1 application  1 application Topical Daily PRN Rama, Venetia Maxon, MD       nitroGLYCERIN (NITROSTAT) SL tablet 0.4 mg  0.4 mg Sublingual Q5 Min x 3 PRN Rama, Venetia Maxon, MD       ondansetron (ZOFRAN) tablet 4 mg  4 mg Oral Q6H PRN Rama, Venetia Maxon, MD       Or   ondansetron (ZOFRAN) injection 4 mg  4 mg Intravenous Q6H PRN Rama, Venetia Maxon, MD       pantoprazole (PROTONIX) injection 40 mg  40 mg Intravenous QHS Rama, Venetia Maxon, MD   40 mg at 04/15/21 2103   pentafluoroprop-tetrafluoroeth (GEBAUERS) aerosol 1 application  1 application Topical PRN Adelfa Koh, NP       polyvinyl alcohol (LIQUIFILM TEARS) 1.4 % ophthalmic solution 1 drop  1 drop Both Eyes Daily PRN Rama, Christina P, MD       sodium chloride flush (NS) 0.9 % injection 3 mL  3 mL Intravenous Q12H Rama, Venetia Maxon, MD   3 mL at  04/16/21 0943   sodium chloride flush (NS) 0.9 % injection 3 mL  3 mL Intravenous PRN Rama, Venetia Maxon, MD       sucralfate (CARAFATE) 1 GM/10ML suspension 1 g  1 g Oral BID Rama, Venetia Maxon, MD   1 g at 04/16/21 6433     Discharge Medications: Please see discharge summary for a list of discharge medications.  Relevant Imaging Results:  Relevant Lab Results:   Additional Information SS#[id number]; HD MWF [address]  [place], [place]

## 2021-04-16 NOTE — Progress Notes (Signed)
MD notified secondary to BP in the 80's, she is asymptomatic. Scheduled midodrine administered, ok to give amiodarone. No bolus ordered ordered as this time. Will continue to monitor.

## 2021-04-16 NOTE — Progress Notes (Signed)
RT NOTE:  Pt manages home BIPAP. RT available if assistance is needed.

## 2021-04-16 NOTE — Progress Notes (Addendum)
[facility] Progress Note   Subjective:    Patient seen and examined at bedside. No acute issues overnight. Denies CP, SOB, and vomiting. Still reports mild nausea with HD. Patient with (-) bruit/thrill R AVF. She expressed concerns with previous failed accesses and would like to know what her current options are. Plan to consult [facility]VS today. Plan for HD 9/13.  Objective Vitals:   04/15/21 1618 04/15/21 2017 04/16/21 0427 04/16/21 0917  BP: (!) 128/46 (!) 110/42 (!) 127/46 (!) 87/33  Pulse: 82 69 70 79  Resp: 19 17 19 16   Temp:  98.1 F (36.7 C) 98.1 F (36.7 C) 98.7 F (37.1 C)  TempSrc:  Oral Oral Oral  SpO2: 93% 90% 98% 100%  Weight:      Height:       Physical Exam General: Chronically-ill appearing; NAD Heart: S1 and S2; No murmurs, gallops, or rubs Lungs: Clear throughout; No wheezing, rales, or rhonchi Abdomen: Large, soft, and non-tender Extremities: Trace RLE and 1+ edema LUE Dialysis Access: [facility] R AVF (-) Bruit/Thrill   Filed Weights   04/14/21 0112 04/14/21 1258 04/14/21 1633  Weight: 70.6 kg 68.1 kg 65.9 kg    Intake/Output Summary (Last 24 hours) at 04/16/2021 0938 Last data filed at 04/16/2021 0456 Gross per 24 hour  Intake 973 ml  Output --  Net 973 ml    Additional Objective Labs: Basic Metabolic Panel: Recent Labs  Lab 04/13/21 1608 04/13/21 1733 04/14/21 0044 04/15/21 0242  NA 136 135 137 136  K 6.1* 6.2* 6.2* 4.3  CL 98 104 100 103  CO2 22  --  24 27  GLUCOSE 83 74 86 84  BUN 42* 39* 42* 14  CREATININE 10.12* 10.30* 9.90* 4.89*  CALCIUM 8.4*  --  8.1* 7.7*  PHOS  --   --   --  3.0   Liver Function Tests: Recent Labs  Lab 04/13/21 1608 04/15/21 0242  AST 14*  --   ALT 10  --   ALKPHOS 84  --   BILITOT 0.8  --   PROT 5.9*  --   ALBUMIN 2.0* 1.5*   Recent Labs  Lab 04/13/21 1608  LIPASE 29   CBC: Recent Labs  Lab 04/13/21 1608 04/13/21 1733 04/14/21 0044 04/15/21 0242  WBC 15.3*  --  17.8* 13.3*   NEUTROABS 13.0*  --   --  10.4*  HGB 10.3* 10.9* 9.2* 8.4*  HCT 31.8* 32.0* 28.2* 26.5*  MCV 89.1  --  88.4 90.4  PLT 279  --  248 223   Blood Culture    Component Value Date/Time   SDES SYNOVIAL 04/25/2020 1354   SPECREQUEST Normal 04/25/2020 1354   CULT  04/25/2020 1354    NO GROWTH Performed at [facility], [place] [address] [place], [place] [address]    REPTSTATUS 04/28/2020 FINAL 04/25/2020 1354    Cardiac Enzymes: No results for input(s): CKTOTAL, CKMB, CKMBINDEX, TROPONINI in the last 168 hours. CBG: No results for input(s): GLUCAP in the last 168 hours. Iron Studies: No results for input(s): IRON, TIBC, TRANSFERRIN, FERRITIN in the last 72 hours. Lab Results  Component Value Date   INR 1.1 04/13/2021   INR 1.5 (H) 04/24/2020   INR 1.0 02/10/2020   Studies/Results: No results found.  Medications:  sodium chloride     ampicillin-sulbactam (UNASYN) IV Stopped (04/15/21 2013)    allopurinol  100 mg Oral QHS   amiodarone  200 mg Oral q AM  apixaban  2.5 mg Oral BID   atorvastatin  40 mg Oral Q1500   Chlorhexidine Gluconate Cloth  6 each Topical Daily   Chlorhexidine Gluconate Cloth  6 each Topical Q0600   cholecalciferol  2,000 Units Oral Daily   darbepoetin (ARANESP) injection - DIALYSIS  60 mcg Intravenous Q Sat-HD   ferric citrate  210 mg Oral TID WC   fluticasone furoate-vilanterol  1 puff Inhalation Daily   And   umeclidinium bromide  1 puff Inhalation Daily   gabapentin  100 mg Oral QHS   midodrine  10 mg Oral TID WC   pantoprazole (PROTONIX) IV  40 mg Intravenous QHS   sodium chloride flush  3 mL Intravenous Q12H   sucralfate  1 g Oral BID    Dialysis Orders: TTS - [facility]  4hrs, BFR 400, DFR 800,  EDW 77, 2K/ 2.5Ca   Access: TDC  Heparin none Mircera 225 mcg q2wks - last 47mcg on 7/14 Calcitriol 1.25 mcg PO qHD  Assessment/Plan: Intractable n/v/d - in setting of diverticulitis and missed 1 week HD.  Slightly improved today.  On IV  antibiotics.  Diverticulitis - recurrent, noted on CT scan.  On IV ABX. Management per primary. Thrombosed AVF - Multiple failed accesses in the past, would like to discuss options with Vascular.  Will consult in AM Monday.  Hyperkalemia - Resolved. Treated medically and with HD.  Last K 4.3.  ESRD -  on HD TTS.  Next HD on 9/13-noted under EDW yesterday-may need to lower EDW at discharge-UF as tolerated.  Hypertension/volume  - Blood pressure soft/normal.  On midodrine. CXR with no indication of volume overload.  Trace edema on exam.  UF as tolerated.  Anemia of CKD - Hgb trending down, last 8.4. Aranesp 104mcg given with HD 9/10. Secondary Hyperparathyroidism -  Correct Ca ok and phos ok. Continue binders, VDRA Nutrition - renal diet w/fluid restrictions once advanced.  DMT2 - per primary COPD  Tobie Poet, NP [facility] 04/16/2021,9:38 AM  LOS: 2 days    N/v/d and abd pain better - still with some nausea.    General elderly female in bed in no acute distress HEENT normocephalic atraumatic extraocular movements intact sclera anicteric Neck supple trachea midline Lungs clear to auscultation bilaterally normal work of breathing at rest on room air Heart  S1S2 no rub  Abdomen soft nontender nondistended Extremities no edema  Psych normal mood and affect Neuro awake and conversant  Access: Left IJ tunn catheter; no b/t of either arm appreciated and reports hx accesses bilateraly  Diverticulitis per primary team.    Thrombosed AVF - multiple prior failed accesses.  Vascular saw her today - awaiting attending input.    Chronic hypotension on midodrine  ESRD on HD - TTS   Given ESRD carafate isn't an option for her  Disposition per primary team.    Claudia Desanctis, MD 04/16/2021 12:13 PM

## 2021-04-16 NOTE — Progress Notes (Signed)
[facility] Progress Note  Patient: [name]    LNL:[id number]  DOA: 04/13/2021     Date of Service: the patient was seen and examined on 04/16/2021  Brief hospital course: Past medical history of ESRD on HD TTS, recurrent GI bleed, hiatal hernia, obesity.  Presents with complaints of abdominal pain.  Found to have diverticulitis.  Currently plan is arrange for safe discharge.  Subjective: No abdominal pain.  No nausea no vomiting.  Tolerating soft diet although minimal oral intake.  Blood pressure was soft this morning.  Had increased cough this morning.  No chest pain.  No shortness of breath.  Assessment and Plan: 1.  Diverticulitis. Intractable nausea and vomiting. Treated with IV antibiotics. Currently improving. Tolerating oral diet although minimal oral intake Continue with the Augmentin.  2.  Intractable nausea and vomiting Improved. Currently resolved.  Monitor.  3.  Hyperkalemia ESRD on TTS HD Chronic hypotension Vascular access issues Continue midodrine. Potassium improved. Appreciate nephrology assistance for HD. Vascular surgery was consulted due to issues with her vascular access and chronic HD catheter status.  Patient currently thinking about any procedure.  Outpatient follow-up recommended. Patient will require a different EDW on discharge.   Iron deficiency anemia due to chronic blood loss Continue iron as tolerated.    OSA (obstructive sleep apnea) Continue BiPAP at night.    HLD (hyperlipidemia) Continue Lipitor.    Hiatal hernia with GERD and esophagitis Will place on PPI therapy and Carafate.    Chronic obstructive pulmonary disease ([place]) Continue Trelegy Ellipta.   Chronic diastolic CHF (congestive heart failure) (HCC) Appears compensated.   Chronic gout 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 Continue allopurinol.   Paroxysmal atrial fibrillation (HCC) Continue amiodarone and Eliquis.   ESRD (end stage renal disease) on  dialysis [facility]) Notify renal in the morning of need for hemodialysis.   Secondary hyperparathyroidism of renal origin ([place])   Obesity Body mass index is 35.65 kg/m.   allopurinol  100 mg Oral QHS   amiodarone  200 mg Oral q AM   amoxicillin-clavulanate  1 tablet Oral QPM   apixaban  2.5 mg Oral BID   atorvastatin  40 mg Oral Q1500   benzonatate  100 mg Oral BID   Chlorhexidine Gluconate Cloth  6 each Topical Daily   Chlorhexidine Gluconate Cloth  6 each Topical Q0600   cholecalciferol  2,000 Units Oral Daily   darbepoetin (ARANESP) injection - DIALYSIS  60 mcg Intravenous Q Sat-HD   dextromethorphan-guaiFENesin  1 tablet Oral BID   ferric citrate  210 mg Oral TID WC   fluticasone furoate-vilanterol  1 puff Inhalation Daily   And   umeclidinium bromide  1 puff Inhalation Daily   gabapentin  100 mg Oral QHS   midodrine  10 mg Oral TID WC   pantoprazole (PROTONIX) IV  40 mg Intravenous QHS   sodium chloride flush  3 mL Intravenous Q12H   sucralfate  1 g Oral BID   Continuous Infusions:  sodium chloride     sodium chloride     sodium chloride     PRN Meds: sodium chloride, sodium chloride, sodium chloride, acetaminophen, alteplase, heparin, lidocaine (PF), lidocaine-prilocaine, Muscle Rub, nitroGLYCERIN, ondansetron **OR** ondansetron (ZOFRAN) IV, pentafluoroprop-tetrafluoroeth, polyvinyl alcohol, sodium chloride flush  Body mass index is 32.57 kg/m.        DVT Prophylaxis:   apixaban (ELIQUIS) tablet 2.5 mg Start: 04/13/21 2315 apixaban (ELIQUIS) tablet 2.5 mg    Advance goals of care discussion: Pt  is Full code.  Family Communication: no family was present at bedside, at the time of interview.   Data Reviewed: I have personally reviewed and interpreted daily labs, tele strips, imaging. Potassium improving  Physical Exam:  General: Appear in mild distress, no Rash; Oral Mucosa Clear, moist. no Abnormal Neck Mass Or lumps, Conjunctiva normal  Cardiovascular: S1 and  S2 Present, no Murmur, Respiratory: good respiratory effort, Bilateral Air entry present and CTA, no Crackles, no wheezes Abdomen: Bowel Sound present, Soft and no tenderness Extremities: no Pedal edema Neurology: alert and oriented to time, place, and person affect appropriate. no new focal deficit Gait not checked due to patient safety concerns   Vitals:   04/16/21 0427 04/16/21 0917 04/16/21 1159 04/16/21 1600  BP: (!) 127/46 (!) 87/33 (!) 109/39 (!) 110/52  Pulse: 70 79 72 80  Resp: 19 16 16 18   Temp: 98.1 F (36.7 C) 98.7 F (37.1 C) 98.1 F (36.7 C) 97.9 F (36.6 C)  TempSrc: Oral Oral Oral Oral  SpO2: 98% 100% 100% 98%  Weight:      Height:        Disposition:  Status is: Inpatient  Remains inpatient appropriate because:Unsafe d/c plan  Dispo: The patient is from: Home              Anticipated d/c is to: SNF              Patient currently is medically stable to d/c.   Difficult to place patient No  Time spent: 35 minutes. I reviewed all nursing notes, pharmacy notes, vitals, pertinent old records. I have discussed plan of care as described above with RN.  Author: Berle Mull, MD [facility] 04/16/2021 9:01 PM  To reach On-call, see care teams to locate the attending and reach out via [email or web address]. Between 7PM-7AM, please contact night-coverage If you still have difficulty reaching the attending provider, please page the [facility] (Director on Call) for [facility] on amion for assistance.

## 2021-04-16 NOTE — Consult Note (Addendum)
Hospital Consult    Reason for Consult:  thrombosed RUE AVF Requesting Physician:  Demetrios Loll MRN #:  [id number]  History of Present Illness: This is a 75 y.o. female who most recently underwent 2nd stage brachiocephalic AVF transposition. At follow-up on 03/30/2021, her physical exam revealed pulsatility in fistula at the proximal upper arm and low flow volume on duplex ultrasound. She was advised regarding proceeding with fistulogram with possible intervention versus proceeding with RUE AVG placement. She deferred decision making until having a chance to discuss with her son, [name], who is her POA for medical decisions.  She discussed this with other family members as well.  She had previously undergone LUE AVF creation which was usable for only one week and then infiltrated. Conversion was made with AVG but this failed immediately.  Because of these complications, she was hesitant to proceed and did not anticipate fistula might thrombose in the interim.  She denies fever or chills. No hand pain. She reports chronic nausea during last hour of HD treatment. She dialyzes on TTS schedule at [facility] location. Currently using left IJ TDC without complications. Ambulates with walker.  Past Medical History:  Diagnosis Date   Blood transfusion without reported diagnosis    CAD (coronary artery disease)    Nonobstructive at cardiac catheterization 2000   Cataract    Cervical cancer ([place]) 1978   CHF (congestive heart failure) (HCC)    Chronic back pain    Chronic kidney disease    Class 2 obesity with body mass index (BMI) of 35 to 39.9 without comorbidity    Complication of anesthesia    hard to awaken with one back surgery - years ago, no problem since   COPD (chronic obstructive pulmonary disease) ([place])    Degenerative disc disease    DM (diabetes mellitus), type 2 with renal complications ([place])    Dyslipidemia    Dyspnea    "due to COPD"   Dysrhythmia    a-fib    ESRD (end stage renal disease) on dialysis ([place])    TTHS- [address]   GERD (gastroesophageal reflux disease)    Gout    Hiatal hernia 07/27/2013   History of diverticulitis of colon    History of hiatal hernia    HTN (hypertension)    09/09/20 not currently on medication, is on medication for hypotension   Hypotension    Iron deficiency anemia    Irritable bowel syndrome    Lumbar radiculopathy    Mixed hyperlipidemia    Moderate major depression, single episode ([place]) 07/21/2019   Neuropathy    OSA (obstructive sleep apnea)    Osteoporosis    Ovarian cancer ([place]) 1978   patient denies. States this was cervical cancer   Oxygen deficiency    room air   PAF (paroxysmal atrial fibrillation) (HCC)    Pneumonia    Sleep apnea    bipap - oxygen 2 l to bipap.   Type 2 diabetes mellitus ([place])    Vitamin B deficiency 12/25/2009   Vitamin B12 deficiency     Past Surgical History:  Procedure Laterality Date   ABDOMINAL HYSTERECTOMY     APPLICATION OF WOUND VAC Right 01/15/2021   Procedure: APPLICATION OF WOUND VAC;  Surgeon: Cherre Robins, MD;  Location: [place];  Service: Vascular;  Laterality: Right;   AV FISTULA PLACEMENT Left 08/05/2019   Procedure: ARTERIOVENOUS (AV) FISTULA CREATION LEFT ARM;  Surgeon: Waynetta Sandy, MD;  Location: [facility]  OR;  Service: Vascular;  Laterality: Left;   AV FISTULA PLACEMENT Left 09/11/2020   Procedure: INSERTION OF ARTERIOVENOUS (AV) GORE-TEX GRAFT ARM LEFT;  Surgeon: Cherre Robins, MD;  Location: [place];  Service: Vascular;  Laterality: Left;   AV FISTULA PLACEMENT Right 11/29/2020   Procedure: Creation of Arteriovenous Fistula Right Upper Arm;  Surgeon: Rosetta Posner, MD;  Location: [place];  Service: Vascular;  Laterality: Right;   [place] Left 10/05/2019   Procedure: SECOND STAGE LEFT BASCILIC VEIN TRANSPOSITION;  Surgeon: Waynetta Sandy, MD;  Location: [place];  Service: Vascular;   Laterality: Left;   [place] Right 01/15/2021   Procedure: RIGHT UPPER ARM SECOND STAGE [place];  Surgeon: Cherre Robins, MD;  Location: [place];  Service: Vascular;  Laterality: Right;  PERIPHERAL NERVE BLOCK   Benign breast cysts     BIOPSY  02/26/2021   Procedure: BIOPSY;  Surgeon: Jackquline Denmark, MD;  Location: [facility] ENDOSCOPY;  Service: Endoscopy;;   CHOLECYSTECTOMY     COLONOSCOPY  10/01/2006   SLF:Pan colonic diverticulosis and moderate internal hemorrhoids/ Otherwise no polyps, masses, inflammatory changes or AVMs/   COLONOSCOPY  2011   SLF: pancolonic diverticulosis, large internal hemorrhoids   COLONOSCOPY N/A 01/26/2016   Procedure: COLONOSCOPY;  Surgeon: [name], MD;  [facility];  Service: Endoscopy;  Laterality: N/A;  830    COLONOSCOPY WITH PROPOFOL N/A 02/10/2020   Procedure: COLONOSCOPY WITH PROPOFOL;  Surgeon: Daneil Dolin, MD;  [facility];  Service: Endoscopy;  Laterality: N/A;  10:45am   ESOPHAGEAL DILATION  02/26/2021   Procedure: ESOPHAGEAL DILATION;  Surgeon: Jackquline Denmark, MD;  Location: [facility] ENDOSCOPY;  Service: Endoscopy;;   ESOPHAGOGASTRODUODENOSCOPY  11/19/2006   SLF: Large hiatal hernia without evidence of Cameron ulcers/. Distal esophageal stricture, which allowed the gastroscope to pass without resistance.  A 16 mm Savary later passed with mild resistance/ Normal stomach.sb bx negative   ESOPHAGOGASTRODUODENOSCOPY  10/01/2006   NOM:[facility] hiatal hernia.  Distal esophagus without evidence of   erythema, ulceration or Barrett's esophagus   ESOPHAGOGASTRODUODENOSCOPY  2011   SLF: large hh, distal esophageal web narrowing to 55mm s/p dilation to 39mm   ESOPHAGOGASTRODUODENOSCOPY N/A 08/06/2013   SLF: 1. Stricture at the gastroesophageal junction 2. large hiatal hernia. 3. Mild erosive gastritis.   ESOPHAGOGASTRODUODENOSCOPY (EGD) WITH PROPOFOL N/A 07/19/2019   rourk: Mild erosive reflux esophagitis.   Mild Schatzki ring status post dilation.  Large hiatal hernia with at least one half of the stomach above the diaphragm.  Gastric mucosa erythematous.   ESOPHAGOGASTRODUODENOSCOPY (EGD) WITH PROPOFOL N/A 02/26/2021   Procedure: ESOPHAGOGASTRODUODENOSCOPY (EGD) WITH PROPOFOL;  Surgeon: Jackquline Denmark, MD;  Location: [facility] ENDOSCOPY;  Service: Endoscopy;  Laterality: N/A;   GIVENS CAPSULE STUDY N/A 08/06/2013   INCOMPLETE-SMALL BOWLE ULCERS   IR FLUORO GUIDE CV LINE LEFT  07/29/2019   IR US GUIDE VASC ACCESS LEFT  07/29/2019   KNEE SURGERY Right    PARTIAL HYSTERECTOMY  1978   PORT-A-CATH REMOVAL Right 11/29/2020   Procedure: REMOVAL OF RIGHT CHEST PORT;  Surgeon: Rosetta Posner, MD;  Location: [place];  Service: Vascular;  Laterality: Right;   small bowel capsule  2008   negative   SPINE SURGERY     TONSILLECTOMY AND ADENOIDECTOMY     Two back surgeries/fusion     UMBILICAL HERNIA REPAIR  2010   UPPER EXTREMITY VENOGRAPHY N/A 11/22/2020  Procedure: UPPER EXTREMITY VENOGRAPHY;  Surgeon: Cherre Robins, MD;  Location: [place] CV LAB;  Service: Cardiovascular;  Laterality: N/A;    No Known Allergies  Prior to Admission medications   Medication Sig Start Date End Date Taking? Authorizing Provider  acetaminophen (TYLENOL) 325 MG tablet Take 650 mg by mouth daily as needed for headache (pain).   Yes [provider]  allopurinol (ZYLOPRIM) 100 MG tablet Take 1 tablet (100 mg total) by mouth at bedtime. 03/18/21  Yes Aline August, MD  amiodarone (PACERONE) 200 MG tablet Take 1 tablet (200 mg total) by mouth in the morning. 01/15/21  Yes Dagoberto Ligas, PA-C  apixaban (ELIQUIS) 2.5 MG TABS tablet Take 2.5 mg by mouth 2 (two) times daily.   Yes [provider]  Biotin 5000 MCG TABS Take 5,000 mcg by mouth in the morning.   Yes [provider]  Camphor-Menthol-Methyl Sal (SALONPAS) 3.08-10-08 % PTCH Place 1 patch onto the skin daily as needed (pain).   Yes [provider]  Carboxymethylcellul-Glycerin (CLEAR EYES FOR DRY EYES) 1-0.25 % SOLN Place 1 drop into both eyes daily as needed (dry eys).   Yes [provider]  Cholecalciferol (VITAMIN D3) 50 MCG (2000 UT) TABS Take 2,000 Units by mouth in the morning.   Yes [provider]  esomeprazole (NEXIUM) 40 MG capsule Take 1 capsule (40 mg total) by mouth 2 (two) times daily before a meal. 02/28/21  Yes Lavina Hamman, MD  ferric citrate (AURYXIA) 1 GM 210 MG(Fe) tablet Take 210 mg by mouth 3 (three) times daily with meals.   Yes [provider]  Fluticasone-Umeclidin-Vilant (TRELEGY ELLIPTA) 100-62.5-25 MCG/INH AEPB Inhale 1 puff into the lungs daily at 12 noon.   Yes [provider]  gabapentin (NEURONTIN) 100 MG capsule Take 100 mg by mouth at bedtime. 12/29/19  Yes [provider]  iron sucrose in sodium chloride 0.9 % 100 mL Iron Sucrose (Venofer) 03/22/21 03/21/22 Yes [provider]  midodrine (PROAMATINE) 10 MG tablet Take 1 tablet (10 mg total) by mouth 3 (three) times daily with meals. 03/18/21 04/17/21 Yes Aline August, MD  nitroGLYCERIN (NITROSTAT) 0.4 MG SL tablet Place 0.4 mg under the tongue every 5 (five) minutes x 3 doses as needed for chest pain. 12/07/19  Yes [provider]  promethazine (PHENERGAN) 12.5 MG tablet Take 1 tablet (12.5 mg total) by mouth every 8 (eight) hours as needed for nausea or vomiting. 03/08/21 04/13/21 Yes Carver, Elon Alas, DO  atorvastatin (LIPITOR) 40 MG tablet Take 40 mg by mouth daily in the afternoon. In the afternoon Patient not taking: Reported on 04/13/2021 01/15/21   Dagoberto Ligas, PA-C  OXYGEN Inhale 2 L into the lungs See admin instructions. Use every night and as needed during the day    [provider]  sucralfate (CARAFATE) 1 GM/10ML suspension Take 10 mLs (1 g total) by mouth 2 (two) times daily for 14 days. 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 Lives in home with [place] - married 13 Y Cook, sew, quilt, crochet   Social Determinants of Health   Financial Resource Strain: Not on file  Food Insecurity: Not on file  Transportation Needs: Not on file  Physical Activity: Not on file  Stress: Not on file  Social Connections: Not on file  Intimate Partner Violence: Not on file     Family History  Problem Relation Age of Onset   Colon cancer Brother        diagnosed age 58. Living.    Ulcers Sister    Diabetes Sister    Heart attack Sister    Kidney failure Sister    Stroke Sister    Ulcers Mother    Diabetes Mother    Heart attack Mother    Stroke Mother    Asthma Mother    Heart disease Mother    Cervical cancer Mother    Heart attack Brother    Heart disease Brother    Asthma Sister    Diabetes Brother    Stroke Maternal Grandmother    Heart attack Maternal Grandmother    Heart attack Other    Early death Father        MVA in his 16s    ROS: [x]  Positive   [ ]  Negative   [ ]  All sytems reviewed and are negative  Cardiac: []  chest pain/pressure []  palpitations []  SOB lying flat []   DOE  Vascular: []  pain in legs while walking []  pain in legs at rest []  pain in legs at night []  non-healing ulcers []  hx of DVT []  swelling in legs  Pulmonary: []  productive cough []  asthma/wheezing []  home O2  Neurologic: []  weakness in []  arms []  legs []  numbness in []  arms []  legs []  hx of CVA []  mini stroke [] difficulty speaking or slurred speech []  temporary loss of vision in one eye []  dizziness  Hematologic: []  hx of cancer []  bleeding problems []  problems with blood clotting easily  Endocrine:   [x]  diabetes []  thyroid disease  GI []  vomiting blood []  blood in stool  GU: [] x CKD/renal failure [] x HD--[]  M/W/F or [x]  T/T/S []  burning with urination []  blood in urine  Psychiatric: []  anxiety []  depression  Musculoskeletal: []  arthritis []  joint pain  Integumentary: []  rashes []  ulcers  Constitutional: []  fever []  chills   Physical Examination  Vitals:   04/16/21 0427 04/16/21 0917  BP: (!) 127/46 (!) 87/33  Pulse: 70 79  Resp: 19 16  Temp: 98.1 F (36.7 C) 98.7 F (37.1 C)  SpO2: 98% 100%   Body mass index is 32.57 kg/m.  General:  WDWN in NAD Gait: Not observed HENT: WNL, normocephalic Pulmonary: normal non-labored breathing, without Rales, rhonchi,  wheezing Cardiac: regular, without  Murmurs, rubs or gallops; Abdomen:  soft, NT/ND Skin: without rashes, ecchymotic areas of LUE from venipuncture/PIV Vascular Exam/Pulses: Unable  to palpate right radial/ulnar or right femoral pulses. 2+ left femoral pulse Extremities: without ischemic changes, without Gangrene , without cellulitis; without open wounds; 5/5 right hand grip strength. No bruit in fistula. Feet warm and well perfused. Unable to palpate pedal pulses Musculoskeletal: no muscle wasting or atrophy  Neurologic: A&O X 3;  No focal weakness or paresthesias are detected; speech is fluent/normal Psychiatric:  The pt has Normal affect  CBC    Component Value Date/Time    WBC 13.3 (H) 04/15/2021 0242   RBC 2.93 (L) 04/15/2021 0242   HGB 8.4 (L) 04/15/2021 0242   HCT 26.5 (L) 04/15/2021 0242   PLT 223 04/15/2021 0242   MCV 90.4 04/15/2021 0242   MCH 28.7 04/15/2021 0242   MCHC 31.7 04/15/2021 0242   RDW 15.8 (H) 04/15/2021 0242   LYMPHSABS 1.0 04/15/2021 0242   MONOABS 1.1 (H) 04/15/2021 0242   EOSABS 0.1 04/15/2021 0242   BASOSABS 0.1 04/15/2021 0242    BMET    Component Value Date/Time   NA 136 04/15/2021 0242   K 4.3 04/15/2021 0242   CL 103 04/15/2021 0242   CO2 27 04/15/2021 0242   GLUCOSE 84 04/15/2021 0242   BUN 14 04/15/2021 0242   CREATININE 4.89 (H) 04/15/2021 0242   CREATININE 1.68 (H) 10/10/2017 1050   CALCIUM 7.7 (L) 04/15/2021 0242   GFRNONAA 9 (L) 04/15/2021 0242   GFRNONAA 30 (L) 10/10/2017 1050   GFRAA 7 (L) 04/28/2020 0835   GFRAA 35 (L) 10/10/2017 1050    COAGS: Lab Results  Component Value Date   INR 1.1 04/13/2021   INR 1.5 (H) 04/24/2020   INR 1.0 02/10/2020     Non-Invasive Vascular Imaging:   04/02/2021 Summary:  Arteriovenous fistula-Elevated velocities and narrowed vessel lumen  observed  involving the first 2.5 cm of the basilic outflow vein from the  anastomosis.  Diminished flow volume.   *See table(s) above for measurements and observations.     Diagnosing physician: Harold Barban MD  Electronically signed by Harold Barban MD on 04/02/2021 at 7:45:05 PM.      ASSESSMENT/PLAN: This is a 75 y.o. female with thrombosed right brachiobasilic AVF. Not yet ready to make a decision regarding catheter dependency. Needs further discussion with surgeon and nephrologist with son's involvement in regards to placing AVG in RUE.  History of atrial fibrillation on Eliquis.  Risa Grill, PA-C [facility] [phone]    Addendum I have independently interviewed and examined patient and agree with PA assessment and plan above.  Discussed with the both the patient at bedside and son via  telephone.  Unfortunately no previous accesses have worked long-term and her catheter has been her most reliable form of access.  At this time she is unsure whether or not she would like to proceed with any further permanent access.  In reviewing her previous procedures appears that the last effort would be a right upper arm AV graft.  I will further discuss with patient tomorrow to develop a definitive plan.  Maddelynn Moosman C. Donzetta Matters, MD [facility] [place] Office: [phone] Pager: [phone]

## 2021-04-16 NOTE — Evaluation (Signed)
Physical Therapy Evaluation Patient Details Name: [name] MRN: [id number] DOB: [birth year] Today's Date: 04/16/2021  History of Present Illness  75 yo admitted 9/9 with N/V/D with diverticulitis and uremia after 2 missed HD sessions. PMhx: ESRD on HD  TTS, cervical CA, CAD, OSA, obesity, DM, GERD, COPD, AFib, HTN, brachiocephalic AVF transposition, CHF, IBS, hiatal hernia, chronic back pain  Clinical Impression  Pt limited by fatigue and reported feeling weak. VSS on RA (see below). Pt performed multiple transfers with min guard-min A and ambulated in room. Pt's deficits include decreased balance, activity tolerance, strength, and balance. Pt would benefit from continuation of PT to improve deficits and function. Recommend SNF upon d/c given pt's functional status and incontinence, unless pt can progress toward goals and family provides additional assist. Pt agreeable to recommendation     Vitals supine - HR 81, BP 115/36(60) EOB - HR 84, BP 125/51(71) Standing - HR 73, BP 134/55(73) End of session - 113/64(80)   Recommendations for follow up therapy are one component of a multi-disciplinary discharge planning process, led by the attending physician.  Recommendations may be updated based on patient status, additional functional criteria and insurance authorization.  Follow Up Recommendations SNF;Supervision/Assistance - 24 hour    Equipment Recommendations       Recommendations for Other Services       Precautions / Restrictions Precautions Precautions: Fall Precaution Comments: monitor BP; incontient bowel Restrictions Weight Bearing Restrictions: No      Mobility  Bed Mobility Overal bed mobility: Needs Assistance Bed Mobility: Supine to Sit     Supine to sit: Min assist;HOB elevated     General bed mobility comments: HOB 30 degrees. min A to elevate trunk as pt had difficulty due to pain in L arm (line insertion). Performed with increased time     Transfers Overall transfer level: Needs assistance Equipment used: Rolling walker (2 wheeled) Transfers: Sit to/from [facility] Sit to Stand: Min assist;Min guard Stand pivot transfers: Min assist       General transfer comment: Sit to stand x5 performed with increased time: 4 from bed and 1 from [facility]. Min guard on initial 4 trials with min A on last trial due to fatigue. Verbal cues provided for hand placement. Min A stand pivot x2 to guide hips/RW toward [facility] and chair respectively.  Ambulation/Gait Ambulation/Gait assistance: Min guard Gait Distance (Feet): 6 Feet Assistive device: Rolling walker (2 wheeled) Gait Pattern/deviations: Step-to pattern;Decreased stride length;Trunk flexed Gait velocity: Decreased Gait velocity interpretation: <1.31 ft/sec, indicative of household ambulator General Gait Details: Ambulated 6' to door then backward stepped 4' to [facility]. Verbal cues for upright posture. pt mentioned feeling weak in LEs  Stairs            Wheelchair Mobility    Modified Rankin (Stroke Patients Only)       Balance Overall balance assessment: Needs assistance Sitting-balance support: Feet unsupported;Single extremity supported Sitting balance-Leahy Scale: Fair Sitting balance - Comments: Intermittently uses uni UE support to maintain sitting balance   Standing balance support: During functional activity;Bilateral upper extremity supported Standing balance-Leahy Scale: Poor Standing balance comment: BUE support on RW during static and dynamic standing                             Pertinent Vitals/Pain Pain Assessment: 0-10 Pain Score: 4  Pain Location: L arm and hand Pain Descriptors / Indicators: Discomfort;Grimacing Pain Intervention(s): Monitored during session;Repositioned  Home Living Family/patient expects to be discharged to:: Private residence Living Arrangements: Children   Type of Home: House Home Access: Level  entry     [place]: One [place]: Environmental consultant - 2 wheels;Walker - 4 wheels Additional Comments: uses 2L O2 when needed (at night and during the day)    Prior Function Level of Independence: Independent with assistive device(s)   Gait / Transfers Assistance Needed: use RW at home and community     Comments: Doesn't report any falls. Per chart pt had several months prior to previous admission a month ago.     Hand Dominance   Dominant Hand: Right    Extremity/Trunk Assessment   Upper Extremity Assessment Upper Extremity Assessment: Defer to OT evaluation    Lower Extremity Assessment Lower Extremity Assessment: Generalized weakness    Cervical / Trunk Assessment Cervical / Trunk Assessment: Normal  Communication   Communication: No difficulties  Cognition Arousal/Alertness: Awake/alert Behavior During Therapy: Flat affect Overall Cognitive Status: Within Functional Limits for tasks assessed                                 General Comments: Followed on step commands inconsistently with increased time. A&Ox4.      General Comments General comments (skin integrity, edema, etc.): VSS on RA    Exercises     Assessment/Plan    PT Assessment Patient needs continued PT services  PT Problem List Decreased strength;Decreased balance;Decreased mobility;Decreased activity tolerance       PT Treatment Interventions Gait training;DME instruction;Therapeutic activities;Therapeutic exercise;Functional mobility training;Balance training    PT Goals (Current goals can be found in the Care Plan section)  Acute Rehab PT Goals Patient Stated Goal: To go to SNF PT Goal Formulation: With patient Time For Goal Achievement: 04/30/21 Potential to Achieve Goals: Good    Frequency Min 3X/week   Barriers to discharge        Co-evaluation               AM-PAC PT "6 Clicks" Mobility  Outcome Measure Help needed turning from your back to your side  while in a flat bed without using bedrails?: A Little Help needed moving from lying on your back to sitting on the side of a flat bed without using bedrails?: A Little Help needed moving to and from a bed to a chair (including a wheelchair)?: A Little Help needed standing up from a chair using your arms (e.g., wheelchair or bedside chair)?: A Little Help needed to walk in hospital room?: A Lot Help needed climbing 3-5 steps with a railing? : Total 6 Click Score: 15    End of Session Equipment Utilized During Treatment: Gait belt Activity Tolerance: Patient limited by fatigue Patient left: in chair;with call bell/phone within reach;with chair alarm set Nurse Communication: Mobility status PT Visit Diagnosis: Unsteadiness on feet (R26.81);Muscle weakness (generalized) (M62.81);Other abnormalities of gait and mobility (R26.89)    Time: 1254-1340 PT Time Calculation (min) (ACUTE ONLY): 46 min   Charges:   PT Evaluation $PT Eval Moderate Complexity: 1 Mod PT Treatments $Therapeutic Activity: 23-37 mins        Louie Casa, SPT Acute Rehab: [phone]   Domingo Dimes 04/16/2021, 2:03 PM

## 2021-04-17 ENCOUNTER — Inpatient Hospital Stay (HOSPITAL_COMMUNITY): Payer: Medicare Other

## 2021-04-17 LAB — CBC WITH DIFFERENTIAL/PLATELET
Abs Immature Granulocytes: 0.71 10*3/uL — ABNORMAL HIGH (ref 0.00–0.07)
Abs Immature Granulocytes: 0.44 10*3/uL — ABNORMAL HIGH (ref 0.00–0.07)
Basophils Absolute: 0.1 10*3/uL (ref 0.0–0.1)
Basophils Absolute: 0.1 10*3/uL (ref 0.0–0.1)
Basophils Relative: 1 %
Basophils Relative: 0 %
Eosinophils Absolute: 0.1 10*3/uL (ref 0.0–0.5)
Eosinophils Absolute: 0 10*3/uL (ref 0.0–0.5)
Eosinophils Relative: 1 %
Eosinophils Relative: 0 %
HCT: 29.9 % — ABNORMAL LOW (ref 36.0–46.0)
HCT: 26.1 % — ABNORMAL LOW (ref 36.0–46.0)
Hemoglobin: 9.4 g/dL — ABNORMAL LOW (ref 12.0–15.0)
Hemoglobin: 8.6 g/dL — ABNORMAL LOW (ref 12.0–15.0)
Immature Granulocytes: 5 %
Immature Granulocytes: 2 %
Lymphocytes Relative: 6 %
Lymphocytes Relative: 5 %
Lymphs Abs: 1 10*3/uL (ref 0.7–4.0)
Lymphs Abs: 0.9 10*3/uL (ref 0.7–4.0)
MCH: 28.7 pg (ref 26.0–34.0)
MCH: 29.8 pg (ref 26.0–34.0)
MCHC: 31.4 g/dL (ref 30.0–36.0)
MCHC: 33 g/dL (ref 30.0–36.0)
MCV: 91.2 fL (ref 80.0–100.0)
MCV: 90.3 fL (ref 80.0–100.0)
Monocytes Absolute: 1.1 10*3/uL — ABNORMAL HIGH (ref 0.1–1.0)
Monocytes Absolute: 1.4 10*3/uL — ABNORMAL HIGH (ref 0.1–1.0)
Monocytes Relative: 7 %
Monocytes Relative: 7 %
Neutro Abs: 12.5 10*3/uL — ABNORMAL HIGH (ref 1.7–7.7)
Neutro Abs: 16.5 10*3/uL — ABNORMAL HIGH (ref 1.7–7.7)
Neutrophils Relative %: 80 %
Neutrophils Relative %: 86 %
Platelets: 291 10*3/uL (ref 150–400)
Platelets: 281 10*3/uL (ref 150–400)
RBC: 3.28 MIL/uL — ABNORMAL LOW (ref 3.87–5.11)
RBC: 2.89 MIL/uL — ABNORMAL LOW (ref 3.87–5.11)
RDW: 16.1 % — ABNORMAL HIGH (ref 11.5–15.5)
RDW: 15.9 % — ABNORMAL HIGH (ref 11.5–15.5)
WBC: 15.5 10*3/uL — ABNORMAL HIGH (ref 4.0–10.5)
WBC: 19.4 10*3/uL — ABNORMAL HIGH (ref 4.0–10.5)
nRBC: 0.4 % — ABNORMAL HIGH (ref 0.0–0.2)
nRBC: 0.3 % — ABNORMAL HIGH (ref 0.0–0.2)

## 2021-04-17 LAB — COMPREHENSIVE METABOLIC PANEL
ALT: 7 U/L (ref 0–44)
AST: 12 U/L — ABNORMAL LOW (ref 15–41)
Albumin: 2.1 g/dL — ABNORMAL LOW (ref 3.5–5.0)
Alkaline Phosphatase: 65 U/L (ref 38–126)
Anion gap: 11 (ref 5–15)
BUN: 8 mg/dL (ref 8–23)
CO2: 27 mmol/L (ref 22–32)
Calcium: 7.4 mg/dL — ABNORMAL LOW (ref 8.9–10.3)
Chloride: 96 mmol/L — ABNORMAL LOW (ref 98–111)
Creatinine, Ser: 3.37 mg/dL — ABNORMAL HIGH (ref 0.44–1.00)
GFR, Estimated: 14 mL/min — ABNORMAL LOW (ref >60)
Glucose, Bld: 87 mg/dL (ref 70–99)
Potassium: 3.5 mmol/L (ref 3.5–5.1)
Sodium: 134 mmol/L — ABNORMAL LOW (ref 135–145)
Total Bilirubin: 1 mg/dL (ref 0.3–1.2)
Total Protein: 5.3 g/dL — ABNORMAL LOW (ref 6.5–8.1)

## 2021-04-17 LAB — RENAL FUNCTION PANEL
Albumin: 1.7 g/dL — ABNORMAL LOW (ref 3.5–5.0)
Anion gap: 13 (ref 5–15)
BUN: 25 mg/dL — ABNORMAL HIGH (ref 8–23)
CO2: 25 mmol/L (ref 22–32)
Calcium: 8.3 mg/dL — ABNORMAL LOW (ref 8.9–10.3)
Chloride: 99 mmol/L (ref 98–111)
Creatinine, Ser: 8.05 mg/dL — ABNORMAL HIGH (ref 0.44–1.00)
GFR, Estimated: 5 mL/min — ABNORMAL LOW (ref >60)
Glucose, Bld: 99 mg/dL (ref 70–99)
Phosphorus: 4 mg/dL (ref 2.5–4.6)
Potassium: 4.5 mmol/L (ref 3.5–5.1)
Sodium: 137 mmol/L (ref 135–145)

## 2021-04-17 LAB — HEPATITIS B SURFACE ANTIGEN: Hepatitis B Surface Ag: NONREACTIVE

## 2021-04-17 LAB — HEPATITIS B CORE ANTIBODY, TOTAL: Hep B Core Total Ab: NONREACTIVE

## 2021-04-17 LAB — LACTIC ACID, PLASMA: Lactic Acid, Venous: 0.8 mmol/L (ref 0.5–1.9)

## 2021-04-17 LAB — HEPATITIS B SURFACE ANTIBODY,QUALITATIVE: Hep B S Ab: NONREACTIVE

## 2021-04-17 MED ORDER — ONDANSETRON HCL 4 MG/2ML IJ SOLN: 4.0000 mg | Freq: Once | INTRAMUSCULAR | Status: AC

## 2021-04-17 MED ORDER — BOOST / RESOURCE BREEZE PO LIQD CUSTOM
1.0000 | Freq: Three times a day (TID) | ORAL | Status: DC
  Administered 2021-04-18 – 2021-04-21 (×6): 1 via ORAL

## 2021-04-17 MED ORDER — SODIUM CHLORIDE 0.9 % IV SOLN
3.0000 g | INTRAVENOUS | Status: DC
  Administered 2021-04-17 – 2021-04-18 (×2): 3 g via INTRAVENOUS
  Filled 2021-04-17 (×2): qty 8

## 2021-04-17 MED ORDER — PANTOPRAZOLE SODIUM 40 MG PO TBEC: 40.0000 mg | DELAYED_RELEASE_TABLET | Freq: Every day | ORAL | Status: DC

## 2021-04-17 MED ORDER — ALBUMIN HUMAN 25 % IV SOLN
25.0000 g | Freq: Once | INTRAVENOUS | Status: AC
  Administered 2021-04-17: 25 g via INTRAVENOUS
  Filled 2021-04-17: qty 100

## 2021-04-17 MED ORDER — SODIUM CHLORIDE 0.9 % IV BOLUS: 500.0000 mL | Freq: Once | INTRAVENOUS | Status: DC

## 2021-04-17 MED ORDER — PANTOPRAZOLE SODIUM 40 MG IV SOLR
40.0000 mg | INTRAVENOUS | Status: DC
  Administered 2021-04-17 – 2021-04-20 (×4): 40 mg via INTRAVENOUS
  Filled 2021-04-17 (×4): qty 40

## 2021-04-17 MED ORDER — ALBUMIN HUMAN 5 % IV SOLN
25.0000 g | Freq: Once | INTRAVENOUS | Status: AC
  Administered 2021-04-18: 12.5 g via INTRAVENOUS
  Filled 2021-04-17: qty 500

## 2021-04-17 MED ORDER — MIDODRINE HCL 5 MG PO TABS
10.0000 mg | ORAL_TABLET | Freq: Once | ORAL | Status: AC
  Administered 2021-04-17: 10 mg via ORAL
  Filled 2021-04-17: qty 2

## 2021-04-17 MED ORDER — PROCHLORPERAZINE EDISYLATE 10 MG/2ML IJ SOLN
5.0000 mg | Freq: Once | INTRAMUSCULAR | Status: AC
  Administered 2021-04-17: 5 mg via INTRAVENOUS
  Filled 2021-04-17: qty 2

## 2021-04-17 NOTE — Progress Notes (Signed)
[facility] Progress Note  Patient: [name]    UMP:[id number]  DOA: 04/13/2021     Date of Service: the patient was seen and examined on 04/17/2021  Brief hospital course: Past medical history of ESRD on HD, HF PEF, COPD, hiatal hernia, GERD, OSA on trilogy, PAF on Eliquis, HTN, diverticulosis.  Presenting with complaints of recurrent intractable nausea and vomiting. Hospitalized in August for the same.  [facility] was consulted.  EGD showed gastritis and a large hiatal hernia.  Was felt at risk for volvulus.  Also treated for diverticulitis. Presented on 9/9 with recurrent nausea and vomiting.  CT abdomen showed diverticulitis.  Treated with IV antibiotics was improving and was on oral antibiotics and oral diet. Started to have nausea and vomiting again on 9/13.  Currently plan is further work-up and treatment for intractable nausea and vomiting as well as consideration for goals of care conversation.  Subjective: Patient had some nausea earlier in the morning.  Later after the hemodialysis patient became nauseous again.  Patient threw off her midodrine in the hemodialysis.  No diarrhea.  Reports some abdominal pain.  Minimal oral intake secondary to nausea.  Blood pressure was also soft in the hemodialysis and they were not able to complete HD and remove any fluids.  Assessment and Plan: 1.  Recurrent diverticulitis Treated with IV antibiotics. We will keep n.p.o. again due to recurrence of the symptoms. Check CT abdomen. Monitor.  2.  Intractable nausea and vomiting. Esophagitis. Large hiatal hernia-at risk for volvulus Failure to thrive So far CT scan negative for hiatal hernia. Currently keeping n.p.o. again. IV antibiotics. Consider goals of care conversation given recurrent symptoms with risk for failure to thrive.  3.  Hyperkalemia ESRD on HD TTS, chronic hypotension with recurrence vascular access issue Continue midodrine. Will give 1 more dose of midodrine  today. Potassium improved with HD. Appreciate nephrology assistance. Vascular surgery was consulted as the patient has been on chronic HD catheter for placement of AVG. Patient currently unsure about the procedure. Palliative care consulted for goals of care conversation.  Outpatient follow-up with vascular surgery for vascular access discussion.  4.  Anemia of chronic kidney disease as well as iron deficiency anemia secondary to chronic blood loss Continue iron therapy.  5.  OSA obesity Patient is on trilogy. So far have been continued. Currently we will hold tonight due to ongoing nausea and concern for vomiting.  6.  HLD Continue Lipitor.  COPD. Continue Lipitor.  HFpEF Compensated. Currently volume management per HD.  Chronic gout On allopurinol.  Currently on hold to minimize p.o. burden.  Paroxysmal A. Fib On amiodarone and Eliquis.  For now we will continue.  Body mass index is 32.87 kg/m.  Nutrition Problem: Inadequate oral intake Etiology: altered GI function, nausea     DVT Prophylaxis:   apixaban (ELIQUIS) tablet 2.5 mg Start: 04/13/21 2315 apixaban (ELIQUIS) tablet 2.5 mg    Pt is Full code.  Communication: no family was present at bedside, at the time of interview.   Data Reviewed: I have personally reviewed and interpreted daily labs, tele strips, imaging. Sodium 137.  Potassium 4.5.  Albumin 1.7.  Mild worsening of leukocytosis. Hemoglobin stable.  Physical Exam:  General: Appear in mild distress, no Rash; Oral Mucosa Clear, moist. no Abnormal Neck Mass Or lumps, Conjunctiva normal  Cardiovascular: S1 and S2 Present, aortic systolic Murmur, Respiratory: good respiratory effort, Bilateral Air entry present and CTA, no Crackles, no wheezes Abdomen: Bowel Sound present,  Soft and mild tenderness Extremities: trace Pedal edema Neurology: alert and oriented to time, place, and person affect appropriate. no new focal deficit Gait not checked due to  patient safety concerns  Vitals:   04/17/21 1630 04/17/21 1700 04/17/21 1730 04/17/21 1800  BP: (!) 95/19 (!) 105/24 (!) 100/28 (!) 93/32  Pulse: 82 86 86 85  Resp: (!) 25 19 (!) 23   Temp:      TempSrc:      SpO2:      Weight:      Height:       Disposition:  Status is: Inpatient  Remains inpatient appropriate because:Ongoing active pain requiring inpatient pain management and IV treatments appropriate due to intensity of illness or inability to take PO  Dispo: The patient is from: Home              Anticipated d/c is to: SNF              Patient currently is not medically stable to d/c.   Difficult to place patient No  Time spent: 35 minutes. I reviewed all nursing notes, pharmacy notes, vitals, pertinent old records. I have discussed plan of care as described above with RN.  Author: Berle Mull, MD [facility] 04/17/2021 7:30 PM  To reach On-call, see care teams to locate the attending and reach out via [email or web address]. Between 7PM-7AM, please contact night-coverage If you still have difficulty reaching the attending provider, please page the [facility] (Director on Call) for [facility] on amion for assistance.

## 2021-04-17 NOTE — Progress Notes (Signed)
I met with patient and her son at bedside.  She is hopeful to get through rehab prior to considering right arm AV graft and this is certainly a reasonable request.  She plans to call our office to schedule right arm AV graft when she is feeling up to it.  She will otherwise continue to use her tunneled dialysis catheter as access.  Servando Snare, MD

## 2021-04-17 NOTE — Progress Notes (Signed)
Treatment complete goal not met due to low BP and nausea. Albumin 25 g administered unable to take PO midodrine. Zofran given. Post BP 92/43. Report given to floor RN Juliet.

## 2021-04-17 NOTE — Progress Notes (Signed)
Patient has had 2 doses of Zofran IV during the night she stated that it all began again after she did her physical therapy 9/12 will continue to monitor. Arthor Captain LPN

## 2021-04-17 NOTE — Progress Notes (Addendum)
[facility] Progress Note   Subjective:    Patient seen and examined at bedside. Reports episodes of nausea overnight. Currently denies SOB and CP. Still indecisive on new AVG placement (current thrombosed R AVF). Plan for HD today.  Objective Vitals:   04/16/21 2109 04/17/21 0400 04/17/21 0845 04/17/21 0852  BP: (!) 115/48 (!) 100/47 (!) 113/41   Pulse: 83 90 79   Resp:      Temp: 98.1 F (36.7 C) 98.4 F (36.9 C)    TempSrc: Oral Oral    SpO2: 93% 93%  99%  Weight:      Height:       Physical Exam General: Chronically-ill appearing; NAD Heart: S1 and S2; No murmurs, gallops, or rubs Lungs: Clear throughout; No wheezing, rales, or rhonchi Abdomen: Large, soft, and non-tender Extremities: Trace RLE and 1+ edema LUE Dialysis Access: TDC; R AVF (-) Bruit/Thrill    Filed Weights   04/14/21 0112 04/14/21 1258 04/14/21 1633  Weight: 70.6 kg 68.1 kg 65.9 kg    Intake/Output Summary (Last 24 hours) at 04/17/2021 0900 Last data filed at 04/16/2021 1300 Gross per 24 hour  Intake 180 ml  Output --  Net 180 ml    Additional Objective Labs: Basic Metabolic Panel: Recent Labs  Lab 04/14/21 0044 04/15/21 0242 04/17/21 0434  NA 137 136 137  K 6.2* 4.3 4.5  CL 100 103 99  CO2 24 27 25   GLUCOSE 86 84 99  BUN 42* 14 25*  CREATININE 9.90* 4.89* 8.05*  CALCIUM 8.1* 7.7* 8.3*  PHOS  --  3.0 4.0   Liver Function Tests: Recent Labs  Lab 04/13/21 1608 04/15/21 0242 04/17/21 0434  AST 14*  --   --   ALT 10  --   --   ALKPHOS 84  --   --   BILITOT 0.8  --   --   PROT 5.9*  --   --   ALBUMIN 2.0* 1.5* 1.7*   Recent Labs  Lab 04/13/21 1608  LIPASE 29   CBC: Recent Labs  Lab 04/13/21 1608 04/13/21 1733 04/14/21 0044 04/15/21 0242 04/17/21 0434  WBC 15.3*  --  17.8* 13.3* 15.5*  NEUTROABS 13.0*  --   --  10.4* 12.5*  HGB 10.3*   < > 9.2* 8.4* 9.4*  HCT 31.8*   < > 28.2* 26.5* 29.9*  MCV 89.1  --  88.4 90.4 91.2  PLT 279  --  248 223 291   <  > = values in this interval not displayed.   Blood Culture    Component Value Date/Time   SDES SYNOVIAL 04/25/2020 1354   SPECREQUEST Normal 04/25/2020 1354   CULT  04/25/2020 1354    NO GROWTH Performed at [facility], [place] [address] [place], [place] [address]    REPTSTATUS 04/28/2020 FINAL 04/25/2020 1354    Cardiac Enzymes: No results for input(s): CKTOTAL, CKMB, CKMBINDEX, TROPONINI in the last 168 hours. CBG: No results for input(s): GLUCAP in the last 168 hours. Iron Studies: No results for input(s): IRON, TIBC, TRANSFERRIN, FERRITIN in the last 72 hours. Lab Results  Component Value Date   INR 1.1 04/13/2021   INR 1.5 (H) 04/24/2020   INR 1.0 02/10/2020   Studies/Results: No results found.  Medications:  sodium chloride     sodium chloride     sodium chloride      allopurinol  100 mg Oral QHS   amiodarone  200 mg Oral q AM  amoxicillin-clavulanate  1 tablet Oral QPM   apixaban  2.5 mg Oral BID   atorvastatin  40 mg Oral Q1500   benzonatate  100 mg Oral BID   Chlorhexidine Gluconate Cloth  6 each Topical Daily   Chlorhexidine Gluconate Cloth  6 each Topical Q0600   cholecalciferol  2,000 Units Oral Daily   darbepoetin (ARANESP) injection - DIALYSIS  60 mcg Intravenous Q Sat-HD   dextromethorphan-guaiFENesin  1 tablet Oral BID   ferric citrate  210 mg Oral TID WC   fluticasone furoate-vilanterol  1 puff Inhalation Daily   And   umeclidinium bromide  1 puff Inhalation Daily   gabapentin  100 mg Oral QHS   midodrine  10 mg Oral TID WC   pantoprazole (PROTONIX) IV  40 mg Intravenous QHS   sodium chloride flush  3 mL Intravenous Q12H   sucralfate  1 g Oral BID    Dialysis Orders: TTS - NW  4hrs, BFR 400, DFR 800,  EDW 77, 2K/ 2.5Ca   Access: TDC  Heparin none Mircera 225 mcg q2wks - last 84mcg on 7/14 Calcitriol 1.25 mcg PO qHD  Assessment/Plan: Intractable n/v/d - in setting of diverticulitis and missed 1 week HD. Seems to be improving but  reported nausea overnight.  Treated with IV antibiotics.  Diverticulitis - recurrent, noted on CT scan.  Treated with IV ABX-continue Augmentin. Management per primary. GERD with esophagitis-Patient currently on Carafate BID-medication not recommended for ESRD patient d/t risk for aluminum toxicity. Continue PPI and Zofran. 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 Seen by [facility] PA yesterday who advised proceeding with Fistulogram; however, she is still reluctant even after speaking with her son. I spoke with patient today-She reports concern from past failed accesses and is unsure whether she wants to go through the process of attempting permanent access placement again. She reports making a decision later and not during this hospitalization. We will keep having ongoing discussions with her and family especially at outpatient. Hyperkalemia - Resolved. Treated medically and with HD.  Last K 4.5.  ESRD -  on HD TTS.  Next HD on 9/13-noted under EDW yesterday-may need to lower EDW at discharge-UF as tolerated.  Hypertension/volume  - Blood pressure soft/normal.  On midodrine. CXR with no indication of volume overload.  Trace edema on exam.  UF as tolerated.  Anemia of CKD - Hgb improving-from 8.4 to now 9.4. Aranesp last given on 9/10. Secondary Hyperparathyroidism -  Correct Ca ok and phos ok. Continue binders, VDRA Nutrition - renal diet w/fluid restrictions once advanced.  DMT2 - per primary COPD  Tobie Poet, NP [facility] 04/17/2021,9:00 AM  LOS: 3 days     Seen and examined independently.  Agree with note and exam as documented above by physician extender and as noted here.  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  s/p vascular consult and she is deciding re: next steps.  Per vascular next option would be right arm graft .  Continue discussions with patient and anticipate outpatient procedure   Chronic hypotension on midodrine   ESRD on HD - TTS  schedule   Given ESRD carafate isn't an option for her - please choose an alternative if an agent is needed  Claudia Desanctis, MD 04/17/2021  1:49 PM

## 2021-04-17 NOTE — Progress Notes (Signed)
Pharmacy Antibiotic Note  [name] is a 75 y.o. female admitted on 04/13/2021 with diverticulitis. Pharmacy has been consulted for Unasyn dosing as she is unable to tolerate Augmentin (planned through 9/15) due to N/V. Pt has ESRD on HD.  Plan: Unasyn 3g IV q24h through 9/15 F/U switch back to PO  Height: 4\' 8"  (142.2 cm) Weight: 66.5 kg (146 lb 9.7 oz) IBW/kg (Calculated) : 36.3  Temp (24hrs), Avg:98.4 F (36.9 C), Min:98.1 F (36.7 C), Max:98.6 F (37 C)  Recent Labs  Lab 04/13/21 1608 04/13/21 1709 04/13/21 1733 04/13/21 1738 04/14/21 0044 04/15/21 0242 04/17/21 0434  WBC 15.3*  --   --   --  17.8* 13.3* 15.5*  CREATININE 10.12*  --  10.30*  --  9.90* 4.89* 8.05*  LATICACIDVEN  --  1.9  --  2.1*  --   --   --     Estimated Creatinine Clearance: 4.5 mL/min (A) (by C-G formula based on SCr of 8.05 mg/dL (H)).    No Known Allergies  Antimicrobials this admission: CTX 9/9 x1 dose Metro 9/10 x 1dose Unasyn 9/10 >> 9/11; 9/13 >> (9/15) Augmentin 9/12>>9/13   Microbiology results: none  Thank you for allowing pharmacy to be a part of this patient's care.  Arrie Senate, PharmD, BCPS, [facility] Clinical Pharmacist [phone] Please check AMION for all [place] numbers 04/17/2021

## 2021-04-17 NOTE — Evaluation (Signed)
Occupational Therapy Evaluation Patient Details Name: [name] MRN: [id number] DOB: Sep 09, 1945 Today's Date: 04/17/2021   History of Present Illness 75 yo admitted 9/9 with N/V/D with diverticulitis and uremia after 2 missed HD sessions. PMhx: ESRD on HD  TTS, cervical CA, CAD, OSA, obesity, DM, GERD, COPD, AFib, HTN, brachiocephalic AVF transposition, CHF, IBS, hiatal hernia, chronic back pain   Clinical Impression   PTA patient reports independent with basic ADLs, son assists with IADLs and using RW for mobility. Admitted for above and presenting with problem list below, including generalized weakness, decreased activity tolerance and impaired balance.  She requires up to max assist for LB Adls and min assist for UB ADLs, bed mobility with min assist and declines OOB today due to fatigue with planned HD session today.  She presents with slow processing and requires verbal cueing, but overall following commands well. Patient will benefit from continued OT services while admitted and after dc at SNF level to optimize independence and safety with ADLs and mobility.  Will follow.      Recommendations for follow up therapy are one component of a multi-disciplinary discharge planning process, led by the attending physician.  Recommendations may be updated based on patient status, additional functional criteria and insurance authorization.   Follow Up Recommendations  SNF;Supervision/Assistance - 24 hour    Equipment Recommendations  3 in 1 bedside commode    Recommendations for Other Services       Precautions / Restrictions Precautions Precautions: Fall Precaution Comments: monitor BP; incontient bowel Restrictions Weight Bearing Restrictions: No      Mobility Bed Mobility Overal bed mobility: Needs Assistance Bed Mobility: Supine to Sit;Sit to Supine     Supine to sit: Min assist;HOB elevated Sit to supine: Min assist   General bed mobility comments: min assist for  trunk support, increased time required    Transfers                 General transfer comment: pt declined due to fatigue    Balance Overall balance assessment: Needs assistance Sitting-balance support: Feet unsupported;Single extremity supported Sitting balance-Leahy Scale: Fair Sitting balance - Comments: min guard at times dynamically during LB dressing                                   ADL either performed or assessed with clinical judgement   ADL Overall ADL's : Needs assistance/impaired     Grooming: Minimal assistance;Sitting;Wash/dry face;Brushing hair Grooming Details (indicate cue type and reason): pt using R hand to brush hair ( L UE assisting R ) and min assist to reach back of head         Upper Body Dressing : Minimal assistance;Sitting   Lower Body Dressing: Maximal assistance;Sitting/lateral leans Lower Body Dressing Details (indicate cue type and reason): min assist for socks, lateral leans with max assist simulated   Toilet Transfer Details (indicate cue type and reason): pt declined due to fatigue         Functional mobility during ADLs: Minimal assistance (limited to EOB only) General ADL Comments: pt limited by decreased activity tolerance, endurance and weakness     Vision   Vision Assessment?: No apparent visual deficits     Perception     Praxis      Pertinent Vitals/Pain Pain Assessment: No/denies pain     Hand Dominance Right   Extremity/Trunk Assessment Upper Extremity Assessment Upper Extremity  Assessment: Generalized weakness (limited to 90 FF bilaterally- pt unclear if this is baseline)   Lower Extremity Assessment Lower Extremity Assessment: Defer to PT evaluation   Cervical / Trunk Assessment Cervical / Trunk Assessment: Normal   Communication Communication Communication: No difficulties   Cognition Arousal/Alertness: Awake/alert Behavior During Therapy: Flat affect Overall Cognitive Status: No  family/caregiver present to determine baseline cognitive functioning Area of Impairment: Problem solving                             Problem Solving: Slow processing;Requires verbal cues General Comments: pt with slow processing but following commands well, lethargic upon entry but improved with sitting EOB   General Comments  VSS    Exercises     Shoulder Instructions      Home Living Family/patient expects to be discharged to:: Private residence Living Arrangements: Children Available Help at Discharge: Family;Available PRN/intermittently Type of Home: House Home Access: Level entry     Home Layout: One level     Bathroom Shower/Tub: Teacher, early years/pre: Standard     Home Equipment: Environmental consultant - 2 wheels;Walker - 4 wheels   Additional Comments: uses 2L O2 when needed (at night and during the day)      Prior Functioning/Environment Level of Independence: Independent with assistive device(s)  Gait / Transfers Assistance Needed: uses a RW at home for mobility ADL's / Homemaking Assistance Needed: independent with ADLs (basin bathing only), son assists with IADLs, pt manages medications and can drive but doesn't typically            OT Problem List: Decreased strength;Decreased activity tolerance;Impaired balance (sitting and/or standing);Decreased coordination;Decreased knowledge of precautions;Decreased knowledge of use of DME or AE;Obesity;Impaired UE functional use;Decreased range of motion      OT Treatment/Interventions: Self-care/ADL training;Therapeutic exercise;DME and/or AE instruction;Therapeutic activities;Patient/family education;Balance training;Energy conservation    OT Goals(Current goals can be found in the care plan section) Acute Rehab OT Goals Patient Stated Goal: To go to SNF OT Goal Formulation: With patient Time For Goal Achievement: 05/01/21 Potential to Achieve Goals: Good  OT Frequency: Min 2X/week   Barriers to  D/C:            Co-evaluation              AM-PAC OT "6 Clicks" Daily Activity     Outcome Measure Help from another person eating meals?: A Little Help from another person taking care of personal grooming?: A Little Help from another person toileting, which includes using toliet, bedpan, or urinal?: A Lot Help from another person bathing (including washing, rinsing, drying)?: A Lot Help from another person to put on and taking off regular upper body clothing?: A Little Help from another person to put on and taking off regular lower body clothing?: A Lot 6 Click Score: 15   End of Session Nurse Communication: Mobility status  Activity Tolerance: Patient limited by fatigue Patient left: in bed;with call bell/phone within reach;with nursing/sitter in room  OT Visit Diagnosis: Other abnormalities of gait and mobility (R26.89);Muscle weakness (generalized) (M62.81)                Time: 3893-7342 OT Time Calculation (min): 24 min Charges:  OT General Charges $OT Visit: 1 Visit OT Evaluation $OT Eval Moderate Complexity: 1 Mod OT Treatments $Self Care/Home Management : 8-22 mins  Jolaine Artist, OT Acute Rehabilitation Services Pager [phone] Office [place]  04/17/2021, 9:15 AM

## 2021-04-17 NOTE — Progress Notes (Signed)
Initial Nutrition Assessment  DOCUMENTATION CODES:   Obesity unspecified  INTERVENTION:  Provide Boost Breeze po TID, each supplement provides 250 kcal and 9 grams of protein.  Encourage adequate PO intake.   NUTRITION DIAGNOSIS:   Inadequate oral intake related to altered GI function, nausea as evidenced by meal completion < 50%.  GOAL:   Patient will meet greater than or equal to 90% of their needs  MONITOR:   PO intake, Supplement acceptance, Labs, Weight trends, Skin, I & O's  REASON FOR ASSESSMENT:   Malnutrition Screening Tool    ASSESSMENT:   75 year old female  history of ESRD on HD, CHF, COPD, recurrent GI bleed, hiatal hernia. Presents with complaints of abdominal pain.  Found to have diverticulitis. Pt with thrombosed AVF. Plans to continue to use tunneled dialysis catheter as access.   Plans for HD today. Pt reports having a decreased appetite which had been ongoing over the past 1 month. Pt reports abdominal pain and nausea. Meal completion has been 0-30%. Son at bedside reports po intake prior to admission has been very varied, however has been encouraging PO intake. Pt reports consuming protein bar when undergoing dialysis. Pt reports dislike of Nepro shake. RD to order Boost Breeze instead to aid in caloric and protein needs. Pt encouraged to eat her food at meals and to drink her supplements.   NUTRITION - FOCUSED PHYSICAL EXAM:  Flowsheet Row Most Recent Value  Orbital Region No depletion  Upper Arm Region No depletion  Thoracic and Lumbar Region No depletion  Buccal Region No depletion  Temple Region No depletion  Clavicle Bone Region No depletion  Clavicle and Acromion Bone Region No depletion  Scapular Bone Region Unable to assess  Dorsal Hand No depletion  Patellar Region No depletion  Anterior Thigh Region No depletion  Posterior Calf Region No depletion  Edema (RD Assessment) Mild  Hair Reviewed  Eyes Reviewed  Mouth Reviewed  Skin Reviewed   Nails Reviewed      Labs and medications reviewed.   Diet Order:   Diet Order             DIET SOFT Room service appropriate? Yes; Fluid consistency: Thin; Fluid restriction: 1200 mL Fluid  Diet effective 1400                   EDUCATION NEEDS:   Not appropriate for education at this time  Skin:  Skin Assessment: Reviewed RN Assessment  Last BM:  9/13  Height:   Ht Readings from Last 1 Encounters:  04/14/21 4\' 8"  (1.422 m)    Weight:   Wt Readings from Last 1 Encounters:  04/14/21 65.9 kg   BMI:  Body mass index is 32.57 kg/m.  Estimated Nutritional Needs:   Kcal:  1850-2050  Protein:  90-100 grams  Fluid:  1.2 L/day  Corrin Parker, MS, RD, LDN RD pager number/after hours weekend pager number on Amion.

## 2021-04-17 NOTE — Progress Notes (Signed)
Patient is off floor to HD

## 2021-04-17 NOTE — Care Management Important Message (Signed)
Important Message  Patient Details  Name: [name] MRN: [id number] Date of Birth: July 22, 1946   Medicare Important Message Given:  Yes     Orbie Pyo 04/17/2021, 3:42 PM

## 2021-04-18 ENCOUNTER — Inpatient Hospital Stay (HOSPITAL_COMMUNITY): Payer: Medicare Other

## 2021-04-18 DIAGNOSIS — K2100 Gastro-esophageal reflux disease with esophagitis, without bleeding: Secondary | ICD-10-CM

## 2021-04-18 DIAGNOSIS — Z515 Encounter for palliative care: Secondary | ICD-10-CM

## 2021-04-18 DIAGNOSIS — K5792 Diverticulitis of intestine, part unspecified, without perforation or abscess without bleeding: Secondary | ICD-10-CM

## 2021-04-18 DIAGNOSIS — Z7189 Other specified counseling: Secondary | ICD-10-CM

## 2021-04-18 DIAGNOSIS — K449 Diaphragmatic hernia without obstruction or gangrene: Secondary | ICD-10-CM

## 2021-04-18 LAB — COMPREHENSIVE METABOLIC PANEL
ALT: 7 U/L (ref 0–44)
AST: 8 U/L — ABNORMAL LOW (ref 15–41)
Albumin: 2.2 g/dL — ABNORMAL LOW (ref 3.5–5.0)
Alkaline Phosphatase: 63 U/L (ref 38–126)
Anion gap: 12 (ref 5–15)
BUN: 8 mg/dL (ref 8–23)
CO2: 27 mmol/L (ref 22–32)
Calcium: 7.6 mg/dL — ABNORMAL LOW (ref 8.9–10.3)
Chloride: 97 mmol/L — ABNORMAL LOW (ref 98–111)
Creatinine, Ser: 3.95 mg/dL — ABNORMAL HIGH (ref 0.44–1.00)
GFR, Estimated: 11 mL/min — ABNORMAL LOW (ref >60)
Glucose, Bld: 86 mg/dL (ref 70–99)
Potassium: 3.5 mmol/L (ref 3.5–5.1)
Sodium: 136 mmol/L (ref 135–145)
Total Bilirubin: 1 mg/dL (ref 0.3–1.2)
Total Protein: 5 g/dL — ABNORMAL LOW (ref 6.5–8.1)

## 2021-04-18 LAB — CBC WITH DIFFERENTIAL/PLATELET
Abs Immature Granulocytes: 0.26 10*3/uL — ABNORMAL HIGH (ref 0.00–0.07)
Basophils Absolute: 0.1 10*3/uL (ref 0.0–0.1)
Basophils Relative: 0 %
Eosinophils Absolute: 0 10*3/uL (ref 0.0–0.5)
Eosinophils Relative: 0 %
HCT: 25.1 % — ABNORMAL LOW (ref 36.0–46.0)
Hemoglobin: 8 g/dL — ABNORMAL LOW (ref 12.0–15.0)
Immature Granulocytes: 1 %
Lymphocytes Relative: 5 %
Lymphs Abs: 0.9 10*3/uL (ref 0.7–4.0)
MCH: 29.3 pg (ref 26.0–34.0)
MCHC: 31.9 g/dL (ref 30.0–36.0)
MCV: 91.9 fL (ref 80.0–100.0)
Monocytes Absolute: 1.5 10*3/uL — ABNORMAL HIGH (ref 0.1–1.0)
Monocytes Relative: 8 %
Neutro Abs: 15.5 10*3/uL — ABNORMAL HIGH (ref 1.7–7.7)
Neutrophils Relative %: 86 %
Platelets: 250 10*3/uL (ref 150–400)
RBC: 2.73 MIL/uL — ABNORMAL LOW (ref 3.87–5.11)
RDW: 16.2 % — ABNORMAL HIGH (ref 11.5–15.5)
WBC: 18.2 10*3/uL — ABNORMAL HIGH (ref 4.0–10.5)
nRBC: 0.2 % (ref 0.0–0.2)

## 2021-04-18 LAB — HEPATITIS B SURFACE ANTIBODY, QUANTITATIVE: Hep B S AB Quant (Post): <3.1 m[IU]/mL — ABNORMAL LOW (ref >9.9)

## 2021-04-18 MED ORDER — SODIUM CHLORIDE 0.9 % IV SOLN: 100.0000 mL | INTRAVENOUS | Status: DC | PRN

## 2021-04-18 MED ORDER — PENTAFLUOROPROP-TETRAFLUOROETH EX AERO: 1.0000 "application " | INHALATION_SPRAY | CUTANEOUS | Status: DC | PRN

## 2021-04-18 MED ORDER — LIDOCAINE HCL (PF) 1 % IJ SOLN: 5.0000 mL | INTRAMUSCULAR | Status: DC | PRN

## 2021-04-18 MED ORDER — SODIUM CHLORIDE 0.9 % IV BOLUS
250.0000 mL | Freq: Once | INTRAVENOUS | Status: AC
  Administered 2021-04-18: 250 mL via INTRAVENOUS

## 2021-04-18 MED ORDER — HEPARIN SODIUM (PORCINE) 1000 UNIT/ML DIALYSIS
1000.0000 [IU] | INTRAMUSCULAR | Status: DC | PRN
  Filled 2021-04-18 (×2): qty 1

## 2021-04-18 MED ORDER — ALTEPLASE 2 MG IJ SOLR: 2.0000 mg | Freq: Once | INTRAMUSCULAR | Status: DC | PRN

## 2021-04-18 MED ORDER — IOHEXOL 350 MG/ML SOLN
65.0000 mL | Freq: Once | INTRAVENOUS | Status: AC | PRN
  Administered 2021-04-18: 65 mL via INTRAVENOUS

## 2021-04-18 MED ORDER — LIDOCAINE-PRILOCAINE 2.5-2.5 % EX CREA
1.0000 "application " | TOPICAL_CREAM | CUTANEOUS | Status: DC | PRN
  Filled 2021-04-18: qty 5

## 2021-04-18 NOTE — Consult Note (Signed)
Palliative Care Consult Note                                  Date: 04/18/2021   Patient Name: [name]  DOB: 06/18/1946  MRN: [id number]  Age / Sex: 75 y.o., female  PCP: [name], MD Referring Physician: Nita Sells, MD  Reason for Consultation: Establishing goals of care  HPI/Patient Profile: Palliative Care consult requested for goals of care discussion in this 75 y.o. female  with past medical history of COPD, dCHF, gout, atrial fibrillation, ESRD (T/Th/Sa), obesity, GERD, hyperlipidemia, cervial cancer, and diverticulitis. She was admitted on 04/13/2021 from home with a 4-day history of nausea and vomiting. She missed 2 dialysis treatments. During work-up abdominal CT showed uncomplicated diverticulitis and stable hiatal hernia. Patient found to be hypotensive. Potassium 6.1, creatinine 10.1. Since admission has been followed by Nephrology and receiving on schedule HD.   Past Medical History:  Diagnosis Date   Blood transfusion without reported diagnosis    CAD (coronary artery disease)    Nonobstructive at cardiac catheterization 2000   Cataract    Cervical cancer ([place]) 1978   CHF (congestive heart failure) (HCC)    Chronic back pain    Chronic kidney disease    Class 2 obesity with body mass index (BMI) of 35 to 39.9 without comorbidity    Complication of anesthesia    hard to awaken with one back surgery - years ago, no problem since   COPD (chronic obstructive pulmonary disease) ([place])    Degenerative disc disease    DM (diabetes mellitus), type 2 with renal complications ([place])    Dyslipidemia    Dyspnea    "due to COPD"   Dysrhythmia    a-fib   ESRD (end stage renal disease) on dialysis ([place])    TTHS- [address]   GERD (gastroesophageal reflux disease)    Gout    Hiatal hernia 07/27/2013   History of diverticulitis of colon    History of hiatal hernia    HTN (hypertension)    09/09/20 not  currently on medication, is on medication for hypotension   Hypotension    Iron deficiency anemia    Irritable bowel syndrome    Lumbar radiculopathy    Mixed hyperlipidemia    Moderate major depression, single episode ([place]) 07/21/2019   Neuropathy    OSA (obstructive sleep apnea)    Osteoporosis    Ovarian cancer ([place]) 1978   patient denies. States this was cervical cancer   Oxygen deficiency    room air   PAF (paroxysmal atrial fibrillation) (HCC)    Pneumonia    Sleep apnea    bipap - oxygen 2 l to bipap.   Type 2 diabetes mellitus ([place])    Vitamin B deficiency 12/25/2009   Vitamin B12 deficiency      Subjective:   This NP Osborne Oman reviewed medical records, received report from team, assessed the patient and then met at the patient's bedside with patient to discuss diagnosis, prognosis, GOC, EOL wishes disposition and options. Her son, [name] later arrived during discussions.   Ms. Blumenstock is awake, alert and oriented x3. Denies pain. States she is hungry but is NPO. Denies nausea at this time.    Concept of Palliative Care was introduced as specialized medical care for people and their families living with serious illness.  It focuses on providing relief from the  symptoms and stress of a serious illness.  The goal is to improve quality of life for both the patient and the family. Values and goals of care important to patient and family were attempted to be elicited.  I created space and opportunity for patient and family to explore state of health prior to admission, thoughts, and feelings. [name] shares she lives in the home with her son, [name]. She is ambulatory with walker assistance. Performs all ADLs independently. Her son prepares most of the meals, runs errands, and takes her to her appointments. She states her appetite has been up and down over the past several weeks. Feels as tho she may have loss about 5-10lb.  Patient states prior to admission she had home health  nurse coming in weekly.  We discussed Her current illness and what it means in the larger context of Her on-going co-morbidities. Natural disease trajectory and expectations were discussed.  Piper verbalized understanding of current illness and co-morbidities. She acknowledges she has not tolerated dialysis as best expected making mention to her hypotension and continued weakness. She shares conversations regarding having a fistula placed however she has experienced several failed attempts and states she is not ready to undergo procedures for placement at this time but would consider outpatient.   I approach topic of patient's feelings regarding he overall health and goals of care. She is tearful sharing her relationship with her husband and the heartbreak of losing him to cancer 3 years ago. Emotional support provided. She acknowledges her health has declined greatly since his death stating "I just let myself go after losing my best friend". Navil states she is not depressed just misses her husband and multiple providers have tried to offer medications and the diagnosis of depression which she will continue to deny.   Questions and concerns were addressed.  Patient and family was encouraged to call with questions or concerns.  PMT will continue to support holistically as needed.  Life Review: Patient is widowed. Retired Visual merchandiser. She has 3 sons (one whom lives in the home with her [name])). She has 6 grandchildren and 2 great grandchildren. Enjoys quilting and sewing. Christian faith.    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 (HCC)  Iron deficiency anemia due to chronic blood loss  Obesity, Class III, BMI 40-49.9 (morbid obesity) (HCC)  OSA (obstructive sleep apnea)  Paroxysmal atrial fibrillation (HCC)  Secondary hyperparathyroidism of renal origin ([place])  Hyperkalemia   Scheduled Meds:  amiodarone  200 mg Oral q AM   apixaban  2.5 mg Oral BID   Chlorhexidine Gluconate Cloth  6 each Topical Daily   darbepoetin (ARANESP) injection - DIALYSIS  60 mcg Intravenous Q Sat-HD   feeding supplement  1 Container Oral TID BM   fluticasone furoate-vilanterol  1 puff Inhalation Daily   And   umeclidinium bromide  1 puff Inhalation Daily   midodrine  10 mg Oral TID WC   pantoprazole (PROTONIX) IV  40 mg Intravenous Q24H   sodium chloride flush  3 mL Intravenous Q12H    Continuous Infusions:  sodium  chloride     sodium chloride     sodium chloride     ampicillin-sulbactam (UNASYN) IV 3 g (04/17/21 2014)    PRN Meds: sodium chloride, sodium chloride, sodium chloride, alteplase, heparin, lidocaine (PF), lidocaine-prilocaine, Muscle Rub, ondansetron **OR** ondansetron (ZOFRAN) IV, pentafluoroprop-tetrafluoroeth, polyvinyl alcohol, sodium chloride flush  No Known Allergies  Review of Systems  Gastrointestinal:  Positive for nausea.  Neurological:  Positive for weakness.  Unless otherwise noted, a complete review of systems is negative.  Physical Exam General: NAD, elderly female  Cardiovascular: regular rate and rhythm, hypotensive Pulmonary: clear ant fields, room air Abdomen: soft, nontender, + bowel sounds Extremities: no edema, no joint deformities Skin: no rashes, warm and dry, upper extremity ecchymosis Neurological: AAO x3, mood appropriate   Vital Signs:  BP (!) 114/29 (BP Location: Left Arm)   Pulse 81   Temp 98.2 F (36.8 C) (Oral)   Resp 16   Ht '4\' 8"'   (1.422 m)   Wt 66.5 kg   LMP  (LMP Unknown)   SpO2 92%   BMI 32.87 kg/m  Pain Scale: 0-10   Pain Score: 0-No pain  SpO2: SpO2: 92 % O2 Device:SpO2: 92 % O2 Flow Rate: .   IO: Intake/output summary:  Intake/Output Summary (Last 24 hours) at 04/18/2021 1643 Last data filed at 04/18/2021 1500 Gross per 24 hour  Intake 0 ml  Output -500 ml  Net 500 ml    LBM: Last BM Date: 04/17/21 Baseline Weight: Weight: 72.1 kg Most recent weight: Weight: 66.5 kg      Palliative Assessment/Data: PPS 30%   Advanced Care Planning:   Primary Decision Maker: HCPOA  Code Status/Advance Care Planning: Full code  A discussion was had today regarding advanced directives. Concepts specific to code status, artifical feeding and hydration, continued IV antibiotics and rehospitalization was had.    Kalyna shares she is open to all medical interventions for a timed trial including artificial feeding (no PEG) and CPR/intubation. I discussed at length her current full code status with consideration to her current illness and co-morbidities. She verbalized understanding again confirming request for Full Code. Ms. Stelzner states if she does not survive life-sustaining efforts or no chance for meaningful recovery she would then wish to be allowed a natural death.   She confirms she does have an advanced directive with her son [place] being her main HCPOA followed by support of her 2 other sons.   The difference between a aggressive medical intervention path and a palliative comfort care path was discussed. I encouraged her to think about her quality of life and the decline she has shared, in addition to limitations of care and when to focus on doing things for her versus to her.   Cameron shares she has had this discussion previously with her outpatient provider and is not interested in discontinuing dialysis or any treatments that is allowing her to live. We discussed at length her thoughts of  "giving up and ending her life before it was time!" She is tearful and appreciative of discussions sharing she wished she could have had this similar discussions and support during the hard end-times with her husband. She shares he was under hospice care in the home and she is familiar with their services and much appreciative however she is no where at the point to consider this level of care for her self as she is remaining hopeful for stability and realistically states she knows she will "never be as she was  before".   I discussed the importance of continued conversation with family and their medical providers regarding overall plan of care and treatment options, ensuring decisions are within the context of the patients values and GOCs.  Hospice and Palliative Care services outpatient were explained and offered. Patient and family verbalized their understanding and awareness of both palliative and hospice's goals and philosophy of care. Recommendations for outpatient palliative given expressed goals for full scope care. Patient and son verbalized understanding and she confirms wishes for palliative referral in addition to ongoing support.    Assessment & Plan:   SUMMARY OF RECOMMENDATIONS   Full Code-as confirmed by patient/son Continue with current plan of care, patient is remaining hopeful for some stability. Is realistic in understanding shew ill not return to fully functional baseline. Is tearful sharing her decline in health s/p the passing of her husband 3 years ago. States she is not interested in considering withdrawal of any forms of care at this time. Is hopeful for discharge to SNF with a goal of returning home with family support.  She has an advanced care directive (not on file). Her son Nakaya Mishkin is main HCPOA per patient.  Emotional support provided. Encouraged patient and family to continue ongoing goals of care discussions. (Spiritual Care referral placed) Outpatient palliative  support at discharge [facility] referral placed)  PMT will continue to support and follow as needed. Please call team line with urgent needs.  Symptom Management:  Intractable nausea/vomiting CT negative stable hiatal hernia/diverticulitis NPO IV antibiotics Receiving Protonix and Zofran  If intractable N/V persist could consider low dose Zyprexa, Haldol, or Amitriptyline (if no other contraindication)  Palliative Prophylaxis:  Nausea/vomiting   Additional Recommendations (Limitations, Scope, Preferences): Full Scope Treatment and Full Code   Psycho-social/Spiritual:  Desire for further Chaplaincy support: yes Additional Recommendations:  ongoing goals of care discussions   Prognosis:  Guarded-Poor   Discharge Planning:  [place] for rehab with Palliative care service follow-up   Patient expressed understanding and was in agreement with this plan.   Time In: 1000 Time Out: 1055 Time Total: 55 min.   Visit consisted of counseling and education dealing with the complex and emotionally intense issues of symptom management and palliative care in the setting of serious and potentially life-threatening illness.Greater than 50%  of this time was spent counseling and coordinating care related to the above assessment and plan.  Signed by:  Alda Lea, AGPCNP-BC Palliative Medicine Team  Phone: [phone] Pager: [phone] Amion: Bjorn Pippin   Thank you for allowing the Palliative Medicine Team to assist in the care of this patient. Please utilize secure chat with additional questions, if there is no response within 30 minutes please call the above phone number. Palliative Medicine Team providers are available by phone from 7am to 5pm daily and can be reached through the team cell phone.  Should this patient require assistance outside of these hours, please call the patient's attending physician.

## 2021-04-18 NOTE — Progress Notes (Signed)
When patient was returned to unit secure chat to remind of bp 81/30. Arthor Captain LPN

## 2021-04-18 NOTE — Progress Notes (Addendum)
[facility] Progress Note   Subjective:    Patient seen and examined at bedside. Patient having n/v with hypotension during yesterday's HD. Midodrine, IV Albumin, and PRN Zofran given with no relief. UFG 0.5-1L but ran even d/t these issues.   Objective Vitals:   04/18/21 0513 04/18/21 0705 04/18/21 0743 04/18/21 0819  BP: (!) 81/30 (!) 82/27 (!) 82/32 (!) 93/45  Pulse: 85   83  Resp: 18   18  Temp: 98.6 F (37 C)   98.5 F (36.9 C)  TempSrc: Axillary   Oral  SpO2: 97%   97%  Weight:      Height:       Physical Exam General: Chronically-ill appearing; NAD Heart: S1 and S2; No murmurs, gallops, or rubs Lungs: Clear throughout; No wheezing, rales, or rhonchi Abdomen: Large, soft, and non-tender Extremities: Trace RLE and 1+ edema; 2+ LUE edema; no edema BLLE or hips Dialysis Access: [facility]; R AVF (-) Bruit/Thrill-not in use   Filed Weights   04/14/21 1633 04/17/21 1457 04/17/21 1850  Weight: 65.9 kg 66.5 kg 66.5 kg    Intake/Output Summary (Last 24 hours) at 04/18/2021 0952 Last data filed at 04/17/2021 1833 Gross per 24 hour  Intake 60 ml  Output -500 ml  Net 560 ml    Additional Objective Labs: Basic Metabolic Panel: Recent Labs  Lab 04/15/21 0242 04/17/21 0434 04/17/21 2019 04/18/21 0256  NA 136 137 134* 136  K 4.3 4.5 3.5 3.5  CL 103 99 96* 97*  CO2 27 25 27 27   GLUCOSE 84 99 87 86  BUN 14 25* 8 8  CREATININE 4.89* 8.05* 3.37* 3.95*  CALCIUM 7.7* 8.3* 7.4* 7.6*  PHOS 3.0 4.0  --   --    Liver Function Tests: Recent Labs  Lab 04/13/21 1608 04/15/21 0242 04/17/21 0434 04/17/21 2019 04/18/21 0256  AST 14*  --   --  12* 8*  ALT 10  --   --  7 7  ALKPHOS 84  --   --  65 63  BILITOT 0.8  --   --  1.0 1.0  PROT 5.9*  --   --  5.3* 5.0*  ALBUMIN 2.0*   < > 1.7* 2.1* 2.2*   < > = values in this interval not displayed.   Recent Labs  Lab 04/13/21 1608  LIPASE 29   CBC: Recent Labs  Lab 04/14/21 0044 04/15/21 0242 04/17/21 0434  04/17/21 2019 04/18/21 0256  WBC 17.8* 13.3* 15.5* 19.4* 18.2*  NEUTROABS  --  10.4* 12.5* 16.5* 15.5*  HGB 9.2* 8.4* 9.4* 8.6* 8.0*  HCT 28.2* 26.5* 29.9* 26.1* 25.1*  MCV 88.4 90.4 91.2 90.3 91.9  PLT 248 223 291 281 250   Blood Culture    Component Value Date/Time   SDES SYNOVIAL 04/25/2020 1354   SPECREQUEST Normal 04/25/2020 1354   CULT  04/25/2020 1354    NO GROWTH Performed at [facility], [place] [address], [place],  [address]    REPTSTATUS 04/28/2020 FINAL 04/25/2020 1354    Cardiac Enzymes: No results for input(s): CKTOTAL, CKMB, CKMBINDEX, TROPONINI in the last 168 hours. CBG: No results for input(s): GLUCAP in the last 168 hours. Iron Studies: No results for input(s): IRON, TIBC, TRANSFERRIN, FERRITIN in the last 72 hours. Lab Results  Component Value Date   INR 1.1 04/13/2021   INR 1.5 (H) 04/24/2020   INR 1.0 02/10/2020   Studies/Results: CT ABDOMEN PELVIS W CONTRAST  Result Date: 04/18/2021  CLINICAL DATA:  Rule out complicated diverticulitis. EXAM: CT ABDOMEN AND PELVIS WITH CONTRAST TECHNIQUE: Multidetector CT imaging of the abdomen and pelvis was performed using the standard protocol following bolus administration of intravenous contrast. CONTRAST:  63mL OMNIPAQUE IOHEXOL 350 MG/ML SOLN COMPARISON:  Five days ago FINDINGS: Lower chest:  Large hiatal hernia.  Atelectasis at the lung bases. Hepatobiliary: No focal liver abnormality.Cholecystectomy. No bile duct dilatation. Pancreas: Generalized atrophy. Spleen: Unremarkable. Adrenals/Urinary Tract: Negative adrenals. No hydronephrosis or stone. Symmetric renal atrophy. Largely collapsed urinary bladder with stable appearance Stomach/Bowel: Improved, nearly completely resolved diverticulitis. No perforation or abscess. Colonic diverticulosis is extensive. Large sliding hiatal hernia with intrathoracic stomach. Vascular/Lymphatic: No acute vascular abnormality. Extensive atheromatous calcification. No mass  or adenopathy. Reproductive:Hysterectomy Other: No ascites or pneumoperitoneum. Musculoskeletal: L1 compression fracture with nonacute appearance and mild height loss. L4-5 PLIF with solid arthrodesis. IMPRESSION: 1. Essentially resolved sigmoid diverticulitis. 2. Interval atelectasis at the bases. 3. Large hiatal hernia. 4.  Aortic Atherosclerosis (ICD10-I70.0). Electronically Signed   By: Jorje Guild M.D.   On: 04/18/2021 06:19    Medications:  sodium chloride     ampicillin-sulbactam (UNASYN) IV 3 g (04/17/21 2014)    amiodarone  200 mg Oral q AM   apixaban  2.5 mg Oral BID   Chlorhexidine Gluconate Cloth  6 each Topical Daily   Chlorhexidine Gluconate Cloth  6 each Topical Q0600   darbepoetin (ARANESP) injection - DIALYSIS  60 mcg Intravenous Q Sat-HD   feeding supplement  1 Container Oral TID BM   fluticasone furoate-vilanterol  1 puff Inhalation Daily   And   umeclidinium bromide  1 puff Inhalation Daily   midodrine  10 mg Oral TID WC   pantoprazole (PROTONIX) IV  40 mg Intravenous Q24H   sodium chloride flush  3 mL Intravenous Q12H    Dialysis Orders: TTS - [facility]  4hrs, BFR 400, DFR 800,  EDW 77, 2K/ 2.5Ca   Access: TDC  Heparin none Mircera 225 mcg q2wks - last 75mcg on 7/14 Calcitriol 1.25 mcg PO qHD  Assessment/Plan: Intractable n/v/d - in setting of recurrent diverticulitis. C/o n/v during HD yesterday. NPO; Treated with IV antibiotics Diverticulitis - recurrent, noted on CT scan.  Treated with IV ABX-continue Augmentin course completed. Now on Ampicillin IV X 3 doses d/t recurrent symptoms. CT ABD/Pelvis: resolved sigmoid diverticulitis and large hiatal hernia. Management per primary. GERD with esophagitis- would avoid Carafate for ESRD patient d/t risk for aluminum toxicity. Continue PPI and Zofran. 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 Seen by [facility] PA yesterday who advised proceeding with Fistulogram; however, she is still reluctant even after speaking  with her son. I spoke with patient today-She reports concern from past failed accesses and is unsure whether she wants to go through the process of attempting permanent access placement again. She reports making a decision later and not during this hospitalization. We will keep having ongoing discussions with her and family especially at outpatient. Hyperkalemia - Resolved. Treated medically and with HD.  Last K 3.5.  ESRD -  on HD TTS. Did not tolerate yesterday's HD d/t hypotension and n/v. slightly positive with HD. Plan for HD 9/15-UF as tolerated. Hypertension/volume  - Blood pressure soft. Hypotension is an issue for her on HD even in outpatient. On midodrine. CXR with no indication of volume overload.  Trace edema on exam.  UF as tolerated.  Anemia of CKD - Hgb now 8.0. Aranesp next due on 9/17. Will follow trends-may  need to raise dose. Secondary Hyperparathyroidism -  Correct Ca ok and phos ok. Continue binders, VDRA Nutrition - renal diet w/fluid restrictions once advanced.  DMT2 - per primary COPD Disposition-Reviewed last primary care note. Palliative care consulted. We agree with this. Considering her multiple commodities, difficulty tolerating HD, and previous failed permanent access, having a GOC discussion would be of benefit.   Tobie Poet, NP [facility] 04/18/2021,9:52 AM  LOS: 4 days    Seen and examined independently.  Agree with note and exam as documented above by physician extender and as noted here.  She states that they have discussed goals of care with her and likens stopping dialysis to "suicide" and states "what am I supposed to tell my kids".  She states that it will take things getting "a whole lot worse than this" before she would consider something like that.   General elderly female in bed in no acute distress HEENT normocephalic atraumatic extraocular movements intact sclera anicteric Neck supple trachea midline Lungs clear to auscultation  bilaterally normal work of breathing at rest on room air Heart  S1S2 no rub  Abdomen soft nontender nondistended Extremities no edema  Psych normal mood and affect Neuro awake and conversant  Access: Left IJ tunn catheter; no b/t of either arm appreciated and reports hx failed accesses bilaterally  Diverticulitis per primary team.  improved per scan   Thrombosed AVF - multiple prior failed accesses.  continue discussions with [facility] as outpatient   Chronic hypotension on midodrine   ESRD on HD - TTS     Claudia Desanctis, MD 04/18/2021  11:02 AM

## 2021-04-18 NOTE — Progress Notes (Signed)
PT Cancellation Note  Patient Details Name: [name] MRN: [id number] DOB: 02/02/1946   Cancelled Treatment:    Reason Eval/Treat Not Completed: Other (comment) per chart, has been hypotensive with BP in the 80s/30s and 90s/40s recently. Reached out to RN for guidance, however we may likely need to hold today with hopes she will be more medically stable tomorrow.   Windell Norfolk, DPT, PN2   Supplemental Physical Therapist [place]    Pager [phone] Acute Rehab Office [phone]

## 2021-04-18 NOTE — Progress Notes (Signed)
[facility] informed of bp 80/30 and that Albumin has been started and CT was notified that patient not stable to go down for test at this time and once the Albumin is finished and bp is stable will call at that time. Arthor Captain LPN

## 2021-04-18 NOTE — TOC Initial Note (Signed)
Transition of Care [facility]) - Initial/Assessment Note    Patient Details  Name: [name] MRN: [id number] Date of Birth: [birth year]  Transition of Care [facility]) CM/SW Contact:    Emeterio Reeve, LCSW Phone Number: 04/18/2021, 4:05 PM  Clinical Narrative:                  CSW received SNF consult. CSW met with pt at bedside. CSW introduced self and explained role at the hospital. Pt reports that PTA she lived at home.   CSW reviewed PT/OT recommendations for SNF. Pt reports she has been to [facility] and is fine with going back. Pt gave CSW permission to fax out to facilities in the area. CSW gave pt medicare.gov rating list to review. CSW explained insurance auth process. Pt reports they are covid vaccinated.   Pt accepted [facility] offer.  CSW will continue to follow.   Expected Discharge Plan: Skilled Nursing Facility Barriers to Discharge: Continued Medical Work up   Patient Goals and CMS Choice Patient states their goals for this hospitalization and ongoing recovery are:: to get better CMS Medicare.gov Compare Post Acute Care list provided to:: Patient Choice offered to / list presented to : Patient  Expected Discharge Plan and Services Expected Discharge Plan: [place]       Living arrangements for the past 2 months: [place]                                      Prior Living Arrangements/Services Living arrangements for the past 2 months: [place] Lives with:: Facility Resident Patient language and need for interpreter reviewed:: Yes Do you feel safe going back to the place where you live?: Yes      Need for Family Participation in Patient Care: Yes (Comment) Care giver support system in place?: Yes (comment)   Criminal Activity/Legal Involvement Pertinent to Current Situation/Hospitalization: No - Comment as needed  Activities of Daily Living Home Assistive Devices/Equipment: Wheelchair,  Environmental consultant (specify type) ADL Screening (condition at time of admission) Patient's cognitive ability adequate to safely complete daily activities?: Yes Is the patient deaf or have difficulty hearing?: Yes Does the patient have difficulty seeing, even when wearing glasses/contacts?: No Does the patient have difficulty concentrating, remembering, or making decisions?: No Patient able to express need for assistance with ADLs?: Yes Does the patient have difficulty dressing or bathing?: No Independently performs ADLs?: Yes (appropriate for developmental age) Does the patient have difficulty walking or climbing stairs?: Yes Weakness of Legs: Both Weakness of Arms/Hands: Both  Permission Sought/Granted   Permission granted to share information with : Yes, Verbal Permission Granted     Permission granted to share info w AGENCY: SNF        Emotional Assessment Appearance:: Appears stated age Attitude/Demeanor/Rapport: Engaged Affect (typically observed): Appropriate Orientation: : Oriented to Self, Oriented to Place, Oriented to  Time, Oriented to Situation Alcohol / Substance Use: Not Applicable Psych Involvement: No (comment)  Admission diagnosis:  Diverticulitis [K57.92] Elevated troponin [R77.8] ESRD on hemodialysis ([place]) [N18.6, Z99.2] Intractable nausea and vomiting [R11.2] Abdominal pain, unspecified abdominal location [R10.9] Patient Active Problem List   Diagnosis Date Noted   Diverticulitis    Non-intractable vomiting    GI bleed 02/25/2021   Acute blood loss anemia 02/25/2021   Hypotension 02/25/2021   Anticoagulated    Presence of cardiac and vascular implant  and graft, unspecified 11/14/2020   Diverticulitis of colon without hemorrhage 09/08/2020   Other disorders of phosphorus metabolism 08/31/2020   Acute respiratory failure due to COVID-19 [facility]) 07/20/2020   Acute respiratory failure with hypoxia ([place]) 07/20/2020   Allergy, unspecified, sequela 06/28/2020   Personal  history of anaphylaxis 06/28/2020   Recurrent falls 04/25/2020   Fall 04/24/2020   ESRD (end stage renal disease) on dialysis [facility])    Essential hypertension    Dyslipidemia    DM (diabetes mellitus), type 2 with renal complications (HCC)    Class 2 obesity with body mass index (BMI) of 35 to 39.9 without comorbidity    OSA (obstructive sleep apnea)    COPD (chronic obstructive pulmonary disease) (HCC)    PAF (paroxysmal atrial fibrillation) ([place])    Acute respiratory distress 03/09/2020   Fluid overload, unspecified 03/09/2020   Hyperkalemia 03/06/2020   GERD with esophagitis 03/06/2020   Acute bacterial bronchitis 03/06/2020   Anemia in chronic kidney disease 11/17/2019   Coagulation defect, unspecified ([place]) 11/17/2019   Pain, unspecified 11/17/2019   Pruritus, unspecified 11/17/2019   Secondary hyperparathyroidism of renal origin ([place]) 11/17/2019   Unspecified protein-calorie malnutrition ([place]) 11/17/2019   Loss of weight 10/22/2019   Goals of care, counseling/discussion 07/23/2019   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 07/21/2019   Paroxysmal atrial fibrillation ([place]) 07/21/2019   Encephalopathy 07/21/2019   Moderate major depression, single episode ([place]) 07/21/2019   Pressure ulcer 07/20/2019   Anorexia 07/10/2019   Weight gain with edema 07/02/2019   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 ([place]) 06/22/2019   Neuropathy due to type 2 diabetes mellitus ([place]) 06/22/2019   Mixed diabetic hyperlipidemia associated with type 2 diabetes mellitus ([place]) 06/22/2019   Chronic gout 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 06/22/2019   Quality of life palliative care encounter 06/19/2019   Atrial fibrillation with RVR (HCC)    Atrial flutter with rapid ventricular response ([place]) 06/16/2019   Dyspnea and respiratory abnormalities 06/01/2019   Acute on chronic respiratory failure with hypoxia and hypercapnia  with respiratory acidosis 06/01/2019   Anasarca 05/31/2019   Nausea without vomiting 05/31/2019   Generalized abdominal pain 05/31/2019   History of colonic polyps 05/31/2019   Lymphedema of both lower extremities 04/28/2019   Cellulitis of finger of right hand 08/19/2018   Chronic respiratory failure with hypoxia ([place]) 07/13/2018   Acute renal failure superimposed on stage 3 chronic kidney disease ([place]) 07/13/2018   Chronic diastolic CHF (congestive heart failure) ([place]) 07/13/2018   COPD (chronic obstructive pulmonary disease) ([place]) 07/13/2018   Flatulence 09/18/2017   IBS (irritable bowel syndrome) 04/03/2017   Acute on chronic diastolic CHF (congestive heart failure) ([place]) 05/08/2016   COPD with acute exacerbation ([place]) 05/08/2016   Constipation 04/25/2016   Peripheral vertigo 06/27/2015   Intractable nausea and vomiting 03/29/2015   Port-A-Cath in place 10/06/2014   Chronic obstructive pulmonary disease ([place]) 04/30/2014   Obesity, Class III, BMI 40-49.9 (morbid obesity) ([place]) 04/30/2014   Hemorrhoids, internal, with bleeding 11/10/2013   Symptomatic anemia 08/18/2013   Other diseases of tongue 08/18/2013   Dark stools 07/27/2013   Chest pain 09/05/2012   Arthropathy 12/09/2011   Edema 12/09/2011   Gout 12/09/2011   Hiatal hernia with GERD and esophagitis    Obstructive chronic bronchitis without exacerbation ([place]) 12/26/2009   Hematochezia 12/26/2009   Dysphagia 12/26/2009   DM type 2 with diabetic peripheral neuropathy ([place]) 12/25/2009  Vitamin B deficiency 12/25/2009   Iron deficiency anemia due to chronic blood loss 12/25/2009   Benign essential HTN 12/25/2009   DEGENERATIVE [place] DISEASE 12/25/2009   OSA (obstructive sleep apnea) 12/25/2009   HLD (hyperlipidemia) 12/25/2009   Type 2 diabetes mellitus with ESRD (end-stage renal disease) ([place]) 12/25/2009   Narrowing of intervertebral disc space 12/25/2009   PCP:  [name], MD Pharmacy:   [facility] - [address], [place] [address] Phone: [phone] Fax: [place], [place] - [address] AT [facility] [place] [address] [place] [address] Phone: [phone] Fax: [phone]     Social Determinants of Health ([place]) Interventions    Readmission Risk Interventions Readmission Risk Prevention Plan 07/20/2020 04/28/2020 03/08/2020  Transportation Screening Complete Complete Complete  PCP or Specialist Appt within 3-5 Days - Not Complete -  Not Complete comments - going to SNF -  [place] or [place] or Home Care Consult comments - - -  Social Work Consult for [place] Planning/Counseling - Complete -  [place] - Not Applicable -  Medication Review ([place]) Referral to Pharmacy Complete Complete  PCP or Specialist appointment within 3-5 days of discharge Complete - Complete  PCP/Specialist Appt Not Complete comments - - -  [place] or Home Care Consult Complete - Complete  SW Recovery Care/Counseling Consult Complete - Complete  Palliative Care Screening Not Applicable - Not [place] Not Applicable - Not Applicable  Some recent data might be hidden    Emeterio Reeve, LCSW Clinical Social Worker

## 2021-04-18 NOTE — Progress Notes (Signed)
PT Cancellation Note  Patient Details Name: [name] MRN: [id number] DOB: 08/13/1945   Cancelled Treatment:    Reason Eval/Treat Not Completed: Patient declined, no reason specified politely refuses due to concern over getting nauseous/vomiting after having not eaten since last night. Will attempt to return if time/schedule allows.   Windell Norfolk, DPT, PN2   Supplemental Physical Therapist [place]    Pager [phone] Acute Rehab Office [phone]

## 2021-04-18 NOTE — Plan of Care (Signed)
  Problem: Education: Goal: Knowledge of General Education information will improve Description Including pain rating scale, medication(s)/side effects and non-pharmacologic comfort measures Outcome: Progressing   Problem: Health Behavior/Discharge Planning: Goal: Ability to manage health-related needs will improve Outcome: Progressing   Problem: Clinical Measurements: Goal: Respiratory complications will improve Outcome: Progressing   Problem: Safety: Goal: Ability to remain free from injury will improve Outcome: Progressing   

## 2021-04-18 NOTE — Progress Notes (Signed)
Secure chat to [facility] hospitalist informed that bp 81/30 after Albumin and that left arm is 3rd spacing and bruised asked if right arm can be used since AV fistula is not working.

## 2021-04-18 NOTE — Progress Notes (Signed)
PROGRESS NOTE   Rita ELIZARDO  OEU:[id number] DOB: Jun 21, 1946 DOA: 04/13/2021 PCP: [name], MD  Brief Narrative:  75 year old white female ESRD TTS [facility] A. fib not on anticoagulation secondary to GI bleed OSA on trilogy vent at bedtime Reflux with esophagitis prior colon polyps Recent hospitalization 7/24-7/27 hematemesis nausea = gastritis with superficial erosions large hiatal hernia risk for volvulus-was transfused at that time Readmit 03/13/2021 grossly positive stool Rx Lokelma NovoLog and was managed conservatively diet was advanced-was started on midodrine for hypotension-was discharged on at that time on oral Augmentin for possible diverticulitis--she also had a thrombosed right brachiobasilic aVF to be addressed in the outpatient setting by vascular surgery  Readmit 04/13/2021 for daily nausea vomiting inability tolerate any po CT scan showed acute uncomplicated diverticulitis distal colon rectosigmoid no perforation fluid collection--L1 vertebral body worrisome for nondisplaced insufficiency fracture Potassium 6.1 Hemoglobin on admission 10.3 She was treated with Rocephin Flagyl and Pepcid    Hospital-Problem based course  Recurrent diverticulitis/prior gastritis with hematemesis Large hiatal hernia at risk for volvulus Unusual situation has had 2-3 episodes of this in the past Complete antibiotics but will need close colonoscopy in the outpatient setting if goals continue to be curative--keep IV antibiotics on board today and transition to p.o. once white count drops in 24-48 H Continue Protonix 40 daily CT scan abdomen shows resolution of diverticulitis picture Allow diet OSA OHSS BMI 32 Sleepy this morning 9/14 resolved on reassessment Resume trilogy vent at night A. fib CHADS2 score >4 Amiodarone 200 a.m., continue Eliquis 2.5 twice daily ESRD TTS [facility] Anemia recent blood loss superimposed on anemia renal disease Secondary hypoparathyroidism Had  dialysis 9/13 received albumin midodrine etc. Defer further management to renal HFpEF Managing volume with HD Chronic gout Asymptomatic allopurinol on hold Global -getting GOC tday as multiple illnesses and large burden of chr illness  DVT prophylaxis: Eliquis Code Status: Full Family Communication: d/w son at bedside [name] [phone] Disposition:  Status is: Inpatient  Remains inpatient appropriate because:Hemodynamically unstable and Unsafe d/c plan  Dispo: The patient is from: Home              Anticipated d/c is to: SNF              Patient currently is not medically stable to d/c.   Difficult to place patient Yes    Consultants:  Renal Palliative  Procedures: Multiple  Antimicrobials:  mp    Subjective:  Disoriented sleepy incoherent and unable to safely take meds improved later on this afternoon no chest pain Hungry and wants to eat   Objective: Vitals:   04/17/21 1948 04/18/21 0027 04/18/21 0513 04/18/21 0705  BP: (!) 90/35 (!) 80/30 (!) 81/30 (!) 82/27  Pulse: 90 83 85   Resp: (!) 21 20 18    Temp: 98.6 F (37 C) 98.2 F (36.8 C) 98.6 F (37 C)   TempSrc: Axillary Axillary Axillary   SpO2: 95% 100% 97%   Weight:      Height:        Intake/Output Summary (Last 24 hours) at 04/18/2021 0743 Last data filed at 04/17/2021 1833 Gross per 24 hour  Intake 60 ml  Output -500 ml  Net 560 ml   Filed Weights   04/14/21 1633 04/17/21 1457 04/17/21 1850  Weight: 65.9 kg 66.5 kg 66.5 kg    Examination:    Data Reviewed: personally reviewed   CBC    Component Value Date/Time   WBC 18.2 (H) 04/18/2021 0256  RBC 2.73 (L) 04/18/2021 0256   HGB 8.0 (L) 04/18/2021 0256   HCT 25.1 (L) 04/18/2021 0256   PLT 250 04/18/2021 0256   MCV 91.9 04/18/2021 0256   MCH 29.3 04/18/2021 0256   MCHC 31.9 04/18/2021 0256   RDW 16.2 (H) 04/18/2021 0256   LYMPHSABS 0.9 04/18/2021 0256   MONOABS 1.5 (H) 04/18/2021 0256   EOSABS 0.0 04/18/2021 0256    BASOSABS 0.1 04/18/2021 0256   CMP Latest Ref Rng & Units 04/18/2021 04/17/2021 04/17/2021  Glucose 70 - 99 mg/dL 86 87 99  BUN 8 - 23 mg/dL 8 8 25(H)  Creatinine 0.44 - 1.00 mg/dL 3.95(H) 3.37(H) 8.05(H)  Sodium 135 - 145 mmol/L 136 134(L) 137  Potassium 3.5 - 5.1 mmol/L 3.5 3.5 4.5  Chloride 98 - 111 mmol/L 97(L) 96(L) 99  CO2 22 - 32 mmol/L 27 27 25   Calcium 8.9 - 10.3 mg/dL 7.6(L) 7.4(L) 8.3(L)  Total Protein 6.5 - 8.1 g/dL 5.0(L) 5.3(L) -  Total Bilirubin 0.3 - 1.2 mg/dL 1.0 1.0 -  Alkaline Phos 38 - 126 U/L 63 65 -  AST 15 - 41 U/L 8(L) 12(L) -  ALT 0 - 44 U/L 7 7 -     Radiology Studies: No results found.   Scheduled Meds:  amiodarone  200 mg Oral q AM   apixaban  2.5 mg Oral BID   Chlorhexidine Gluconate Cloth  6 each Topical Daily   darbepoetin (ARANESP) injection - DIALYSIS  60 mcg Intravenous Q Sat-HD   feeding supplement  1 Container Oral TID BM   fluticasone furoate-vilanterol  1 puff Inhalation Daily   And   umeclidinium bromide  1 puff Inhalation Daily   midodrine  10 mg Oral TID WC   pantoprazole (PROTONIX) IV  40 mg Intravenous Q24H   sodium chloride flush  3 mL Intravenous Q12H   Continuous Infusions:  sodium chloride     ampicillin-sulbactam (UNASYN) IV 3 g (04/17/21 2014)     LOS: 4 days   Time spent: 75  Nita Sells, MD [facility] To contact the attending provider between 7A-7P or the covering provider during after hours 7P-7A, please log into the web site [email or web address] and access using universal [place] password for that web site. If you do not have the password, please call the hospital operator.  04/18/2021, 7:43 AM

## 2021-04-19 LAB — CBC WITH DIFFERENTIAL/PLATELET
Abs Immature Granulocytes: 0.42 10*3/uL — ABNORMAL HIGH (ref 0.00–0.07)
Basophils Absolute: 0.1 10*3/uL (ref 0.0–0.1)
Basophils Relative: 1 %
Eosinophils Absolute: 0.1 10*3/uL (ref 0.0–0.5)
Eosinophils Relative: 1 %
HCT: 25.6 % — ABNORMAL LOW (ref 36.0–46.0)
Hemoglobin: 8.3 g/dL — ABNORMAL LOW (ref 12.0–15.0)
Immature Granulocytes: 3 %
Lymphocytes Relative: 6 %
Lymphs Abs: 0.9 10*3/uL (ref 0.7–4.0)
MCH: 29.5 pg (ref 26.0–34.0)
MCHC: 32.4 g/dL (ref 30.0–36.0)
MCV: 91.1 fL (ref 80.0–100.0)
Monocytes Absolute: 1.2 10*3/uL — ABNORMAL HIGH (ref 0.1–1.0)
Monocytes Relative: 9 %
Neutro Abs: 11.6 10*3/uL — ABNORMAL HIGH (ref 1.7–7.7)
Neutrophils Relative %: 80 %
Platelets: 306 10*3/uL (ref 150–400)
RBC: 2.81 MIL/uL — ABNORMAL LOW (ref 3.87–5.11)
RDW: 16.4 % — ABNORMAL HIGH (ref 11.5–15.5)
WBC: 14.3 10*3/uL — ABNORMAL HIGH (ref 4.0–10.5)
nRBC: 0.2 % (ref 0.0–0.2)

## 2021-04-19 LAB — COMPREHENSIVE METABOLIC PANEL WITH GFR
ALT: 8 U/L (ref 0–44)
AST: 11 U/L — ABNORMAL LOW (ref 15–41)
Albumin: 2.1 g/dL — ABNORMAL LOW (ref 3.5–5.0)
Alkaline Phosphatase: 68 U/L (ref 38–126)
Anion gap: 11 (ref 5–15)
BUN: 12 mg/dL (ref 8–23)
CO2: 26 mmol/L (ref 22–32)
Calcium: 8.1 mg/dL — ABNORMAL LOW (ref 8.9–10.3)
Chloride: 98 mmol/L (ref 98–111)
Creatinine, Ser: 5.4 mg/dL — ABNORMAL HIGH (ref 0.44–1.00)
GFR, Estimated: 8 mL/min — ABNORMAL LOW
Glucose, Bld: 99 mg/dL (ref 70–99)
Potassium: 3.9 mmol/L (ref 3.5–5.1)
Sodium: 135 mmol/L (ref 135–145)
Total Bilirubin: 0.7 mg/dL (ref 0.3–1.2)
Total Protein: 5 g/dL — ABNORMAL LOW (ref 6.5–8.1)

## 2021-04-19 LAB — GLUCOSE, CAPILLARY: Glucose-Capillary: 101 mg/dL — ABNORMAL HIGH (ref 70–99)

## 2021-04-19 MED ORDER — ALBUMIN HUMAN 25 % IV SOLN
INTRAVENOUS | Status: AC
  Administered 2021-04-19: 25 g via INTRAVENOUS
  Filled 2021-04-19: qty 100

## 2021-04-19 MED ORDER — DARBEPOETIN ALFA 100 MCG/0.5ML IJ SOSY
100.0000 ug | PREFILLED_SYRINGE | INTRAMUSCULAR | Status: DC
  Administered 2021-04-21: 100 ug via INTRAVENOUS
  Filled 2021-04-19 (×2): qty 0.5

## 2021-04-19 MED ORDER — ALBUMIN HUMAN 5 % IV SOLN: 25.0000 g | Freq: Once | INTRAVENOUS | Status: DC

## 2021-04-19 MED ORDER — AMOXICILLIN-POT CLAVULANATE 500-125 MG PO TABS
1.0000 | ORAL_TABLET | Freq: Every day | ORAL | Status: AC
  Administered 2021-04-19: 500 mg via ORAL
  Filled 2021-04-19: qty 1

## 2021-04-19 NOTE — Progress Notes (Signed)
Occupational Therapy Treatment Patient Details Name: [name] MRN: [id number] DOB: [birth year] Today's Date: 04/19/2021   History of present illness 75 yo admitted 9/9 with N/V/D with diverticulitis and uremia after 2 missed HD sessions. PMhx: ESRD on HD  TTS, cervical CA, CAD, OSA, obesity, DM, GERD, COPD, AFib, HTN, brachiocephalic AVF transposition, CHF, IBS, hiatal hernia, chronic back pain   OT comments  Pt at this time presented in bed and agreed for therapy session but was limited by fatigue and BP. Pt required moderate assist supine to sitting, min assist from sitting to standing to walker with unilateral support due to LUE discomfort then required to sit down with less then 1 min of sitting and then on second attempt required mod assist with stand pivot transfer to chair and reported fatigue with activity . Pt currently with functional limitations due to the deficits listed below (see OT Problem List).  Pt will benefit from skilled OT to increase their safety and independence with ADL and functional mobility for ADL to facilitate discharge to venue listed below.   BP supine 139/49 Sitting 145/47 Standing 158/111 Sitting 140/53    Recommendations for follow up therapy are one component of a multi-disciplinary discharge planning process, led by the attending physician.  Recommendations may be updated based on patient status, additional functional criteria and insurance authorization.    Follow Up Recommendations  SNF;Supervision/Assistance - 24 hour    Equipment Recommendations  3 in 1 bedside commode    Recommendations for Other Services      Precautions / Restrictions Precautions Precautions: Fall Precaution Comments: monitor BP; incontient bowel Restrictions Weight Bearing Restrictions: No       Mobility Bed Mobility Overal bed mobility: Needs Assistance Bed Mobility: Supine to Sit     Supine to sit: Mod assist;HOB elevated     General bed mobility  comments: deu to pain in LUE use for bed mobility and reaching for bed rails    Transfers Overall transfer level: Needs assistance Equipment used: Rolling walker (2 wheeled) Transfers: Sit to/from [facility] Sit to Stand: Mod assist;From elevated surface Stand pivot transfers: Mod assist;From elevated surface            Balance Overall balance assessment: Needs assistance Sitting-balance support: Feet supported;Single extremity supported Sitting balance-Leahy Scale: Fair Sitting balance - Comments: min guard   Standing balance support: Single extremity supported (due to pain in LUE limited ability to place WB) Standing balance-Leahy Scale: Poor                             ADL either performed or assessed with clinical judgement   ADL Overall ADL's : Needs assistance/impaired Eating/Feeding: Set up;Cueing for safety;Cueing for sequencing;Sitting   Grooming: Minimal assistance;Sitting;Wash/dry face;Brushing hair   Upper Body Bathing: Minimal assistance;Cueing for safety;Cueing for sequencing;Sitting   Lower Body Bathing: Maximal assistance;Cueing for safety;Cueing for sequencing;Sit to/from stand;Sitting/lateral leans   Upper Body Dressing : Minimal assistance;Sitting   Lower Body Dressing: Maximal assistance;Sitting/lateral leans   Toilet Transfer: Moderate assistance;Cueing for safety;Cueing for sequencing;BSC;Stand-pivot   Toileting- Clothing Manipulation and Hygiene: Total assistance;Cueing for safety;Cueing for sequencing;Sit to/from stand         General ADL Comments: pt limited endurance and unable to stand for more then 1 min interval     Vision   Vision Assessment?: No apparent visual deficits   Perception     Praxis      Cognition Arousal/Alertness:  Awake/alert Behavior During Therapy: Flat affect Overall Cognitive Status: No family/caregiver present to determine baseline cognitive functioning Area of Impairment:  Problem solving                             Problem Solving: Slow processing General Comments: pt able to follow comands but needs cues due to current level for safety        Exercises     Shoulder Instructions       General Comments      Pertinent Vitals/ Pain       Pain Assessment: Faces Faces Pain Scale: Hurts little more Pain Location: L arm and hand Pain Descriptors / Indicators: Discomfort;Grimacing Pain Intervention(s): Limited activity within patient's tolerance;Monitored during session;Repositioned  Home Living                                          Prior Functioning/Environment              Frequency  Min 2X/week        Progress Toward Goals  OT Goals(current goals can now be found in the care plan section)  Progress towards OT goals: Progressing toward goals  Acute Rehab OT Goals Patient Stated Goal: To go to SNF OT Goal Formulation: With patient Time For Goal Achievement: 05/01/21 Potential to Achieve Goals: Good ADL Goals Pt Will Perform Grooming: with modified independence;standing Pt Will Perform Lower Body Dressing: sit to/from stand;with min assist Pt Will Transfer to Toilet: with supervision;ambulating;bedside commode Pt Will Perform Toileting - Clothing Manipulation and hygiene: with modified independence;sit to/from stand Pt/caregiver will Perform Home Exercise Program: Increased strength;Increased ROM;Both right and left upper extremity;With written HEP provided  Plan Discharge plan remains appropriate    Co-evaluation                 AM-PAC OT "6 Clicks" Daily Activity     Outcome Measure   Help from another person eating meals?: A Little Help from another person taking care of personal grooming?: A Little Help from another person toileting, which includes using toliet, bedpan, or urinal?: A Lot Help from another person bathing (including washing, rinsing, drying)?: A Lot Help from another  person to put on and taking off regular upper body clothing?: A Little Help from another person to put on and taking off regular lower body clothing?: A Lot 6 Click Score: 15    End of Session Equipment Utilized During Treatment: Gait belt;Rolling walker  OT Visit Diagnosis: Other abnormalities of gait and mobility (R26.89);Muscle weakness (generalized) (M62.81)   Activity Tolerance Patient limited by fatigue   Patient Left in chair;with call bell/phone within reach;with chair alarm set   Nurse Communication          Time: 0936-1000 OT Time Calculation (min): 24 min  Charges: OT General Charges $OT Visit: 1 Visit OT Treatments $Self Care/Home Management : 23-37 mins  Joeseph Amor OTR/L  Acute Rehab Services  [phone] office number [phone] pager number   Joeseph Amor 04/19/2021, 11:06 AM

## 2021-04-19 NOTE — Progress Notes (Addendum)
PROGRESS NOTE   [name]  GEZ:[id number] DOB: April 09, 1946 DOA: 04/13/2021 PCP: [name], MD  Brief Narrative:  75 year old white female ESRD TTS [facility] A. fib not on anticoagulation secondary to GI bleed OSA on trilogy vent at bedtime Reflux with esophagitis prior colon polyps Recent hospitalization 7/24-7/27 hematemesis nausea = gastritis with superficial erosions large hiatal hernia risk for volvulus-was transfused at that time Readmit 03/13/2021 grossly positive stool Rx Lokelma NovoLog and was managed conservatively diet was advanced-was started on midodrine for hypotension-was discharged on at that time on oral Augmentin for possible diverticulitis--she also had a thrombosed right brachiobasilic aVF to be addressed in the outpatient setting by vascular surgery  Readmit 04/13/2021 for daily nausea vomiting inability tolerate any po CT scan showed acute uncomplicated diverticulitis distal colon rectosigmoid no perforation fluid collection--L1 vertebral body worrisome for nondisplaced insufficiency fracture Potassium 6.1 Hemoglobin on admission 10.3 She was treated with Rocephin Flagyl and Pepcid    Hospital-Problem based course  Recurrent diverticulitis/prior gastritis with hematemesis Large hiatal hernia at risk for volvulus Complete antibiotics ?close colonoscopy as OP IV Abx to complete 9/16 Continue Protonix 40 daily CT scan abdomen shows resolution of diverticulitis picture Is now tolerating diet Superficial phlebitis LUE from IV Monitor-elevate arm, place ice and if no better will d/c in am on doxycyline for 3-5 days OSA OHSS BMI 32 Occasional daytime somnolence Resume trilogy vent at night A. fib CHADS2 score >4 Amiodarone 200 a.m., continue Eliquis 2.5 twice daily ESRD TTS [facility] Anemia recent blood loss superimposed on anemia renal disease Secondary hypoparathyroidism Had dialysis 9/13 received albumin midodrine etc. Will need goals adjusted in  OP setting  Will need scheduled Midodrine 10 tid HFpEF Managing volume with HD Chronic gout Asymptomatic allopurinol on hold Global getting GOC tday as multiple illnesses and large burden of chr illness  DVT prophylaxis: Eliquis Code Status: Full Family Communication: d/w son at bedside 9/14, 9/15 [name] [phone] Disposition:  Status is: Inpatient  Remains inpatient appropriate because:Hemodynamically unstable and Unsafe d/c plan  Dispo: The patient is from: Home              Anticipated d/c is to: SNF potentially in am              Patient currently is not medically stable to d/c.   Difficult to place patient Yes    Consultants:  Renal Palliative  Procedures: Multiple  Antimicrobials:  mp    Subjective:  Coherent awake alert L hand very tender No distress otherwise--eating without issue no cp fever chills no dark or tarry stool   Objective: Vitals:   04/18/21 2000 04/19/21 0523 04/19/21 0800 04/19/21 0827  BP: (!) 110/32 (!) 111/37 (!) 117/31   Pulse: 85 79 75   Resp: 19 16 18    Temp: 99.3 F (37.4 C) 98.3 F (36.8 C) 98.6 F (37 C)   TempSrc: Oral Oral Oral   SpO2: 93% 93% 99% 92%  Weight:      Height:        Intake/Output Summary (Last 24 hours) at 04/19/2021 1209 Last data filed at 04/18/2021 1500 Gross per 24 hour  Intake 0 ml  Output --  Net 0 ml    Filed Weights   04/14/21 1633 04/17/21 1457 04/17/21 1850  Weight: 65.9 kg 66.5 kg 66.5 kg    Examination:  Coherent pleasant in nad no focal deficit Cta b no added sound Abd obese nt nd no rebound no guard Some LE edema LUE  significantly swollen with swelling at dorsal wrist and tenderness   Data Reviewed: personally reviewed   CBC    Component Value Date/Time   WBC 14.3 (H) 04/19/2021 0308   RBC 2.81 (L) 04/19/2021 0308   HGB 8.3 (L) 04/19/2021 0308   HCT 25.6 (L) 04/19/2021 0308   PLT 306 04/19/2021 0308   MCV 91.1 04/19/2021 0308   MCH 29.5 04/19/2021 0308   MCHC  32.4 04/19/2021 0308   RDW 16.4 (H) 04/19/2021 0308   LYMPHSABS 0.9 04/19/2021 0308   MONOABS 1.2 (H) 04/19/2021 0308   EOSABS 0.1 04/19/2021 0308   BASOSABS 0.1 04/19/2021 0308   CMP Latest Ref Rng & Units 04/19/2021 04/18/2021 04/17/2021  Glucose 70 - 99 mg/dL 99 86 87  BUN 8 - 23 mg/dL 12 8 8   Creatinine 0.44 - 1.00 mg/dL 5.40(H) 3.95(H) 3.37(H)  Sodium 135 - 145 mmol/L 135 136 134(L)  Potassium 3.5 - 5.1 mmol/L 3.9 3.5 3.5  Chloride 98 - 111 mmol/L 98 97(L) 96(L)  CO2 22 - 32 mmol/L 26 27 27   Calcium 8.9 - 10.3 mg/dL 8.1(L) 7.6(L) 7.4(L)  Total Protein 6.5 - 8.1 g/dL 5.0(L) 5.0(L) 5.3(L)  Total Bilirubin 0.3 - 1.2 mg/dL 0.7 1.0 1.0  Alkaline Phos 38 - 126 U/L 68 63 65  AST 15 - 41 U/L 11(L) 8(L) 12(L)  ALT 0 - 44 U/L 8 7 7      Radiology Studies: CT ABDOMEN PELVIS W CONTRAST  Result Date: 04/18/2021 CLINICAL DATA:  Rule out complicated diverticulitis. EXAM: CT ABDOMEN AND PELVIS WITH CONTRAST TECHNIQUE: Multidetector CT imaging of the abdomen and pelvis was performed using the standard protocol following bolus administration of intravenous contrast. CONTRAST:  38mL OMNIPAQUE IOHEXOL 350 MG/ML SOLN COMPARISON:  Five days ago FINDINGS: Lower chest:  Large hiatal hernia.  Atelectasis at the lung bases. Hepatobiliary: No focal liver abnormality.Cholecystectomy. No bile duct dilatation. Pancreas: Generalized atrophy. Spleen: Unremarkable. Adrenals/Urinary Tract: Negative adrenals. No hydronephrosis or stone. Symmetric renal atrophy. Largely collapsed urinary bladder with stable appearance Stomach/Bowel: Improved, nearly completely resolved diverticulitis. No perforation or abscess. Colonic diverticulosis is extensive. Large sliding hiatal hernia with intrathoracic stomach. Vascular/Lymphatic: No acute vascular abnormality. Extensive atheromatous calcification. No mass or adenopathy. Reproductive:Hysterectomy Other: No ascites or pneumoperitoneum. Musculoskeletal: L1 compression fracture with  nonacute appearance and mild height loss. L4-5 PLIF with solid arthrodesis. IMPRESSION: 1. Essentially resolved sigmoid diverticulitis. 2. Interval atelectasis at the bases. 3. Large hiatal hernia. 4.  Aortic Atherosclerosis (ICD10-I70.0). Electronically Signed   By: Jorje Guild M.D.   On: 04/18/2021 06:19     Scheduled Meds:  amiodarone  200 mg Oral q AM   apixaban  2.5 mg Oral BID   Chlorhexidine Gluconate Cloth  6 each Topical Daily   darbepoetin (ARANESP) injection - DIALYSIS  60 mcg Intravenous Q Sat-HD   feeding supplement  1 Container Oral TID BM   fluticasone furoate-vilanterol  1 puff Inhalation Daily   And   umeclidinium bromide  1 puff Inhalation Daily   midodrine  10 mg Oral TID WC   pantoprazole (PROTONIX) IV  40 mg Intravenous Q24H   sodium chloride flush  3 mL Intravenous Q12H   Continuous Infusions:  sodium chloride     sodium chloride     sodium chloride     ampicillin-sulbactam (UNASYN) IV 3 g (04/18/21 2134)     LOS: 5 days   Time spent: [place], MD [facility] To contact the attending provider between 7A-7P or  the covering provider during after hours 7P-7A, please log into the web site [email or web address] and access using universal [place] password for that web site. If you do not have the password, please call the hospital operator.  04/19/2021, 12:09 PM

## 2021-04-19 NOTE — TOC Progression Note (Signed)
Transition of Care [facility]) - Progression Note    Patient Details  Name: [name] MRN: [id number] Date of Birth: 1945/08/17  Transition of Care [facility]) CM/SW Contact  Emeterio Reeve, [place] Phone Number: 04/19/2021, 2:28 PM  Clinical Narrative:     N[facility] can accept pt for SNF when she is medically ready. Facility does allow weekend admissions. Pt will need covid test at DC.  Expected Discharge Plan: [place] Barriers to Discharge: Continued Medical Work up  Expected Discharge Plan and Services Expected Discharge Plan: [place]       Living arrangements for the past 2 months: [place]                                       Social Determinants of Health (SDOH) Interventions    Readmission Risk Interventions Readmission Risk Prevention Plan 07/20/2020 04/28/2020 03/08/2020  Transportation Screening Complete Complete Complete  PCP or Specialist Appt within 3-5 Days - Not Complete -  Not Complete comments - going to SNF -  [place] or [employer] or Home Care Consult comments - - -  Social Work Consult for [place] Planning/Counseling - Complete -  Palliative Care Screening - Not Applicable -  Medication Review ([place]) Referral to Pharmacy Complete Complete  PCP or Specialist appointment within 3-5 days of discharge Complete - Complete  PCP/Specialist Appt Not Complete comments - - -  [place] or Home Care Consult Complete - Complete  SW Recovery Care/Counseling Consult Complete - Complete  Palliative Care Screening Not Applicable - Not [place] Not Applicable - Not Applicable  Some recent data might be hidden   Emeterio Reeve, LCSW Clinical Social Worker

## 2021-04-19 NOTE — Progress Notes (Signed)
Pt placed on home trilogy unit. RT will cont to monitor.

## 2021-04-19 NOTE — Progress Notes (Signed)
This chaplain responded to a PMT consult for spiritual care and emotional support.  The Pt. is in HD at the time of the visit and open to the chaplain's presence.  The chaplain listens reflectively as the Pt. tells the story of her life with her husband and the grief she is experiencing three years after his death. The chaplain listens and gives the Pt. permission to cry. The chaplain understands family is the source of the love she is missing and the reason she has agreed to a SNF.  The Pt. expressed an interest in renewing a source of a strength through her faith.  The Pt. appreciated the listening presence the PMT has offered her and accepted the chaplain's invitation for prayer.  This chaplain is available for F/U spiritual care as needed.

## 2021-04-19 NOTE — Progress Notes (Signed)
Physical Therapy Treatment Patient Details Name: [name] MRN: [id number] DOB: 1945/11/05 Today's Date: 04/19/2021   History of Present Illness 75 yo admitted 9/9 with N/V/D with diverticulitis and uremia after 2 missed HD sessions. PMhx: ESRD on HD  TTS, cervical CA, CAD, OSA, obesity, DM, GERD, COPD, AFib, HTN, brachiocephalic AVF transposition, CHF, IBS, hiatal hernia, chronic back pain    PT Comments    Pt is up to chair when PT arrived, and noted her LUE had been weeping on the pillow.  Pt was assisted to do ROM to LE's and then to return to bed per her request for HD.  Pt was assisted to get her LUE supported and elevated on dry pads.  Follow up as pt tolerates with goals of acute PT.   Recommendations for follow up therapy are one component of a multi-disciplinary discharge planning process, led by the attending physician.  Recommendations may be updated based on patient status, additional functional criteria and insurance authorization.  Follow Up Recommendations  SNF     Equipment Recommendations  None recommended by PT    Recommendations for Other Services       Precautions / Restrictions Precautions Precautions: Fall Precaution Comments: monitor BP, incontinent Restrictions Weight Bearing Restrictions: No     Mobility  Bed Mobility Overal bed mobility: Needs Assistance Bed Mobility: Sit to Supine       Sit to supine: Mod assist;HOB elevated   General bed mobility comments: LUE is weeping with fragile skin    Transfers Overall transfer level: Needs assistance Equipment used: Rolling walker (2 wheeled) Transfers: Sit to/from [facility] Sit to Stand: Mod assist Stand pivot transfers: Mod assist          Ambulation/Gait                 Stairs             Wheelchair Mobility    Modified Rankin (Stroke Patients Only)       Balance Overall balance assessment: Needs assistance Sitting-balance support: Feet  supported Sitting balance-Leahy Scale: Fair     Standing balance support: Single extremity supported Standing balance-Leahy Scale: Poor Standing balance comment: single UE support to pivot to bed with direct assistance                            Cognition Arousal/Alertness: Awake/alert Behavior During Therapy: Flat affect Overall Cognitive Status: No family/caregiver present to determine baseline cognitive functioning Area of Impairment: Following commands                             Problem Solving: Slow processing;Requires verbal cues        Exercises General Exercises - Lower Extremity Ankle Circles/Pumps: AAROM;5 reps Heel Slides: AAROM;10 reps Hip ABduction/ADduction: AAROM;10 reps    General Comments General comments (skin integrity, edema, etc.): pt was assisted to get to bed with no RW due to discomfort and sensitivity on LUE      Pertinent Vitals/Pain Pain Assessment: Faces Faces Pain Scale: Hurts little more Pain Location: L arm and hand Pain Descriptors / Indicators: Discomfort;Grimacing Pain Intervention(s): Limited activity within patient's tolerance;Monitored during session;Premedicated before session;Repositioned    Home Living                      Prior Function  PT Goals (current goals can now be found in the care plan section) Acute Rehab PT Goals Patient Stated Goal: To go to SNF    Frequency    Min 3X/week      PT Plan Current plan remains appropriate    Co-evaluation              AM-PAC PT "6 Clicks" Mobility   Outcome Measure  Help needed turning from your back to your side while in a flat bed without using bedrails?: A Little Help needed moving from lying on your back to sitting on the side of a flat bed without using bedrails?: A Little Help needed moving to and from a bed to a chair (including a wheelchair)?: A Little Help needed standing up from a chair using your arms (e.g.,  wheelchair or bedside chair)?: A Lot Help needed to walk in hospital room?: A Lot Help needed climbing 3-5 steps with a railing? : A Lot 6 Click Score: 15    End of Session Equipment Utilized During Treatment: Gait belt Activity Tolerance: Patient limited by fatigue Patient left: in bed;with call bell/phone within reach;with bed alarm set;with family/visitor present Nurse Communication: Mobility status PT Visit Diagnosis: Unsteadiness on feet (R26.81);Muscle weakness (generalized) (M62.81);Pain Pain - Right/Left: Left Pain - part of body: Arm     Time: 7902-4097 PT Time Calculation (min) (ACUTE ONLY): 25 min  Charges:  $Therapeutic Exercise: 8-22 mins $Therapeutic Activity: 8-22 mins                Ramond Dial 04/19/2021, 8:54 PM  Mee Hives, PT MS Acute Rehab Dept. Number: [place]

## 2021-04-19 NOTE — Progress Notes (Addendum)
[facility] Progress Note   Subjective:    Patient seen and examined at bedside. No complaints/concerns. Reports feeling a little better. She mentions going to rehab upon discharge. Plan for HD today.  Objective Vitals:   04/18/21 2000 04/19/21 0523 04/19/21 0800 04/19/21 0827  BP: (!) 110/32 (!) 111/37 (!) 117/31   Pulse: 85 79 75   Resp: 19 16 18    Temp: 99.3 F (37.4 C) 98.3 F (36.8 C) 98.6 F (37 C)   TempSrc: Oral Oral Oral   SpO2: 93% 93% 99% 92%  Weight:      Height:       Physical Exam General: Chronically-ill appearing; NAD Heart: S1 and S2; No murmurs, gallops, or rubs Lungs: Clear throughout; No wheezing, rales, or rhonchi Abdomen: Large, soft, and non-tender Extremities: 2+ LUE edema; no edema BLLE or hips Dialysis Access: [facility]; R AVF (-) Bruit/Thrill-not in use   Filed Weights   04/14/21 1633 04/17/21 1457 04/17/21 1850  Weight: 65.9 kg 66.5 kg 66.5 kg    Intake/Output Summary (Last 24 hours) at 04/19/2021 0844 Last data filed at 04/18/2021 1500 Gross per 24 hour  Intake 0 ml  Output --  Net 0 ml    Additional Objective Labs: Basic Metabolic Panel: Recent Labs  Lab 04/15/21 0242 04/17/21 0434 04/17/21 2019 04/18/21 0256 04/19/21 0308  NA 136 137 134* 136 135  K 4.3 4.5 3.5 3.5 3.9  CL 103 99 96* 97* 98  CO2 27 25 27 27 26   GLUCOSE 84 99 87 86 99  BUN 14 25* 8 8 12   CREATININE 4.89* 8.05* 3.37* 3.95* 5.40*  CALCIUM 7.7* 8.3* 7.4* 7.6* 8.1*  PHOS 3.0 4.0  --   --   --    Liver Function Tests: Recent Labs  Lab 04/17/21 2019 04/18/21 0256 04/19/21 0308  AST 12* 8* 11*  ALT 7 7 8   ALKPHOS 65 63 68  BILITOT 1.0 1.0 0.7  PROT 5.3* 5.0* 5.0*  ALBUMIN 2.1* 2.2* 2.1*   Recent Labs  Lab 04/13/21 1608  LIPASE 29   CBC: Recent Labs  Lab 04/15/21 0242 04/17/21 0434 04/17/21 2019 04/18/21 0256 04/19/21 0308  WBC 13.3* 15.5* 19.4* 18.2* 14.3*  NEUTROABS 10.4* 12.5* 16.5* 15.5* 11.6*  HGB 8.4* 9.4* 8.6* 8.0* 8.3*   HCT 26.5* 29.9* 26.1* 25.1* 25.6*  MCV 90.4 91.2 90.3 91.9 91.1  PLT 223 291 281 250 306   Blood Culture    Component Value Date/Time   SDES SYNOVIAL 04/25/2020 1354   SPECREQUEST Normal 04/25/2020 1354   CULT  04/25/2020 1354    NO GROWTH Performed at [facility], [place] [address], [place], [place] [address]    REPTSTATUS 04/28/2020 FINAL 04/25/2020 1354    Cardiac Enzymes: No results for input(s): CKTOTAL, CKMB, CKMBINDEX, TROPONINI in the last 168 hours. CBG: No results for input(s): GLUCAP in the last 168 hours. Iron Studies: No results for input(s): IRON, TIBC, TRANSFERRIN, FERRITIN in the last 72 hours. Lab Results  Component Value Date   INR 1.1 04/13/2021   INR 1.5 (H) 04/24/2020   INR 1.0 02/10/2020   Studies/Results: CT ABDOMEN PELVIS W CONTRAST  Result Date: 04/18/2021 CLINICAL DATA:  Rule out complicated diverticulitis. EXAM: CT ABDOMEN AND PELVIS WITH CONTRAST TECHNIQUE: Multidetector CT imaging of the abdomen and pelvis was performed using the standard protocol following bolus administration of intravenous contrast. CONTRAST:  82mL OMNIPAQUE IOHEXOL 350 MG/ML SOLN COMPARISON:  Five days ago FINDINGS: Lower chest:  Large hiatal hernia.  Atelectasis at the lung bases. Hepatobiliary: No focal liver abnormality.Cholecystectomy. No bile duct dilatation. Pancreas: Generalized atrophy. Spleen: Unremarkable. Adrenals/Urinary Tract: Negative adrenals. No hydronephrosis or stone. Symmetric renal atrophy. Largely collapsed urinary bladder with stable appearance Stomach/Bowel: Improved, nearly completely resolved diverticulitis. No perforation or abscess. Colonic diverticulosis is extensive. Large sliding hiatal hernia with intrathoracic stomach. Vascular/Lymphatic: No acute vascular abnormality. Extensive atheromatous calcification. No mass or adenopathy. Reproductive:Hysterectomy Other: No ascites or pneumoperitoneum. Musculoskeletal: L1 compression fracture with nonacute  appearance and mild height loss. L4-5 PLIF with solid arthrodesis. IMPRESSION: 1. Essentially resolved sigmoid diverticulitis. 2. Interval atelectasis at the bases. 3. Large hiatal hernia. 4.  Aortic Atherosclerosis (ICD10-I70.0). Electronically Signed   By: Jorje Guild M.D.   On: 04/18/2021 06:19    Medications:  sodium chloride     sodium chloride     sodium chloride     ampicillin-sulbactam (UNASYN) IV 3 g (04/18/21 2134)    amiodarone  200 mg Oral q AM   apixaban  2.5 mg Oral BID   Chlorhexidine Gluconate Cloth  6 each Topical Daily   darbepoetin (ARANESP) injection - DIALYSIS  60 mcg Intravenous Q Sat-HD   feeding supplement  1 Container Oral TID BM   fluticasone furoate-vilanterol  1 puff Inhalation Daily   And   umeclidinium bromide  1 puff Inhalation Daily   midodrine  10 mg Oral TID WC   pantoprazole (PROTONIX) IV  40 mg Intravenous Q24H   sodium chloride flush  3 mL Intravenous Q12H    Dialysis Orders: TTS - [facility]  4hrs, BFR 400, DFR 800,  EDW 77, 2K/ 2.5Ca   Access: TDC  Heparin none Mircera 225 mcg q2wks - last 45mcg on 7/14 Calcitriol 1.25 mcg PO qHD  Assessment/Plan: Intractable n/v/d - in setting of recurrent diverticulitis. Treated with IV antibiotics Diverticulitis - recurrent, noted on CT scan.  Treated with IV ABX-continue Augmentin course completed. Now on Ampicillin IV X 3 doses d/t recurrent symptoms. CT ABD/Pelvis: resolved sigmoid diverticulitis and large hiatal hernia. Resume diet. Management per primary. GERD with esophagitis- would avoid Carafate for ESRD patient d/t risk for aluminum toxicity. Continue PPI and Zofran. 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 Plan for ongoing discussion at outpatient with her and family-patient wants more time to think things over with family before making a final decision.  Hyperkalemia - Resolved. Treated medically and with HD.  Last K 3.9.  ESRD -  on HD TTS. HD today-UF as tolerated, pre-medicate with  PRN Zofran pre-HD. Patient on Midodrine. Hypertension/volume  - Blood pressure soft. Hypotension is an issue for her on HD even in outpatient. On midodrine. CXR with no indication of volume overload. Edema Les improving. UF as tolerated.  Anemia of CKD - Hgb now 8.3. Aranesp next due on 9/17. Will follow trends-may need to raise dose. Secondary Hyperparathyroidism -  Correct Ca ok and phos ok. Continue binders, VDRA Nutrition - renal diet w/fluid restrictions once advanced.  DMT2 - per primary COPD Disposition-Patient seen by palliative care yesterday-GOC discussed-remains full code. Awaiting SNF placement.   Rita Poet, NP [facility] 04/19/2021,8:44 AM  LOS: 5 days    Seen and examined independently.  Agree with note and exam as documented above by physician extender and as noted here.  General adult female in bed in no acute distress HEENT normocephalic atraumatic extraocular movements intact sclera anicteric Neck supple trachea midline Lungs clear to auscultation bilaterally normal work of breathing at  rest  Heart S1S2 no rub Abdomen soft nontender nondistended Extremities no pitting edema  Psych normal mood and affect Neuro - alert and oriented x 3 provides hx and follows commands Access: left IJ tunn catheter   Diverticulitis per primary team.  improved per scan   Thrombosed AVF - multiple prior failed accesses.  continue discussions with [facility] as outpatient   Chronic hypotension on midodrine   ESRD on HD - TTS.  She has expressed that at this time she does wish to continue with HD and "things would have to get a whole lot worse" before she would consider stopping  Anemia CKD - increased mircera dose for 9/17 to 100 mcg. Follow trends  Disposition per primary team    Claudia Desanctis, MD 04/19/2021  1:29 PM

## 2021-04-20 ENCOUNTER — Inpatient Hospital Stay (HOSPITAL_COMMUNITY): Payer: Medicare Other

## 2021-04-20 DIAGNOSIS — M79602 Pain in left arm: Secondary | ICD-10-CM

## 2021-04-20 DIAGNOSIS — M7989 Other specified soft tissue disorders: Secondary | ICD-10-CM

## 2021-04-20 DIAGNOSIS — R609 Edema, unspecified: Secondary | ICD-10-CM

## 2021-04-20 LAB — SARS CORONAVIRUS 2 (TAT 6-24 HRS): SARS Coronavirus 2: NEGATIVE

## 2021-04-20 MED ORDER — HEPARIN SODIUM (PORCINE) 1000 UNIT/ML DIALYSIS
1000.0000 [IU] | INTRAMUSCULAR | Status: DC | PRN
  Administered 2021-04-21: 3800 [IU] via INTRAVENOUS_CENTRAL
  Filled 2021-04-20: qty 1

## 2021-04-20 MED ORDER — SODIUM CHLORIDE 0.9 % IV SOLN: 100.0000 mL | INTRAVENOUS | Status: DC | PRN

## 2021-04-20 MED ORDER — LIDOCAINE-PRILOCAINE 2.5-2.5 % EX CREA: 1.0000 "application " | TOPICAL_CREAM | CUTANEOUS | Status: DC | PRN

## 2021-04-20 MED ORDER — DOXYCYCLINE MONOHYDRATE 100 MG PO TABS: 100.0000 mg | ORAL_TABLET | Freq: Two times a day (BID) | ORAL | 0 refills | Status: DC

## 2021-04-20 MED ORDER — MIDODRINE HCL 10 MG PO TABS: 10.0000 mg | ORAL_TABLET | Freq: Three times a day (TID) | ORAL | 0 refills | Status: AC

## 2021-04-20 MED ORDER — PENTAFLUOROPROP-TETRAFLUOROETH EX AERO: 1.0000 "application " | INHALATION_SPRAY | CUTANEOUS | Status: DC | PRN

## 2021-04-20 MED ORDER — LIDOCAINE HCL (PF) 1 % IJ SOLN: 5.0000 mL | INTRAMUSCULAR | Status: DC | PRN

## 2021-04-20 MED ORDER — ALTEPLASE 2 MG IJ SOLR: 2.0000 mg | Freq: Once | INTRAMUSCULAR | Status: DC | PRN

## 2021-04-20 MED ORDER — ESOMEPRAZOLE MAGNESIUM 40 MG PO CPDR: 40.0000 mg | DELAYED_RELEASE_CAPSULE | Freq: Two times a day (BID) | ORAL | 0 refills | Status: AC

## 2021-04-20 NOTE — TOC Transition Note (Addendum)
Transition of Care [facility]) - CM/SW Discharge Note   Patient Details  Name: [name] MRN: [id number] Date of Birth: 01-08-1946  Transition of [facility]) CM/SW Contact:  Emeterio Reeve, LCSW Phone Number: 04/20/2021, 11:52 AM   Clinical Narrative:     Pt will not DC today. Pt will DC tomorrow after HD.   Patient will DC to: [facility] Anticipated DC date: 04/20/21 Transport by: Corey Harold     Per MD patient ready for DC to [facility]. RN, patient, patient's family, and facility notified of DC. Discharge Summary and FL2 sent to facility. DC packet on chart. Insurance Josem Kaufmann has been received and pt is covid negative. Ambulance transport requested for patient.    RN to call report to [phone].  CSW will sign off for now as social work intervention is no longer needed. Please consult [place] again if new needs arise.   Final next level of care: Skilled Nursing Facility Barriers to Discharge: Barriers Resolved   Patient Goals and CMS Choice Patient states their goals for this hospitalization and ongoing recovery are:: to get better CMS Medicare.gov Compare Post Acute Care list provided to:: Patient Choice offered to / list presented to : Patient  Discharge Placement              Patient chooses bed at: [facility] Patient to be transferred to facility by: [facility] Name of family member notified: son Patient and family notified of of transfer: 04/20/21  Discharge Plan and Services                                     Social Determinants of Health ([place]) Interventions     Readmission Risk Interventions Readmission Risk Prevention Plan 07/20/2020 04/28/2020 03/08/2020  Transportation Screening Complete Complete Complete  PCP or Specialist Appt within 3-5 Days - Not Complete -  Not Complete comments - going to SNF -  [place] or [place] or Home Care Consult comments - - -  Social Work Consult for [place] Planning/Counseling -  Complete -  [place] - Not Applicable -  Medication Review ([place]) Referral to Pharmacy Complete Complete  PCP or Specialist appointment within 3-5 days of discharge Complete - Complete  PCP/Specialist Appt Not Complete comments - - -  [place] or Home Care Consult Complete - Complete  SW Recovery Care/Counseling Consult Complete - Complete  Palliative Care Screening Not Applicable - Not [place] Not Applicable - Not Applicable  Some recent data might be hidden    Emeterio Reeve, LCSW Clinical Social Worker

## 2021-04-20 NOTE — Discharge Summary (Addendum)
Physician Discharge Summary  [name] LPF:[id number] DOB: 11/18/1945 DOA: 04/13/2021  PCP: [name], MD  Admit date: 04/13/2021 Discharge date: 04/20/2021  Time spent: 38 minutes  Recommendations for Outpatient Follow-up:  Needs renal panel iron studies 1 week with CBC differential Recommend outpatient follow-up with nephrology for further goals of care Started on doxycycline 100 twice daily on discharge for superficial phlebitis-please elevate left upper extremity and try to avoid needle sticks other than at dialysis Continue goals of care discussions in the outpatient setting Will need outpatient discussion with vascular surgery for AV graft replacement-they are aware of the same I will CC Dr. Donzetta Matters Suggest outpatient coordination with gastroenterology in the next month or so for work-up of recurrent bleeds ---may require scope if stabilized for the same electively  Discharge Diagnoses:  MAIN problem for hospitalization   Nausea vomiting  Please see below for itemized issues addressed in [place]- refer to other progress notes for clarity if needed  Discharge Condition: Improved but fair overall  Diet recommendation:   Heart healthy sodium restricted  Filed Weights   04/17/21 1457 04/17/21 1850 04/19/21 1330  Weight: 66.5 kg 66.5 kg 69 kg    History of present illness:  75 year old white female ESRD TTS [facility] A. fib not on anticoagulation secondary to GI bleed OSA on trilogy vent at bedtime Reflux with esophagitis prior colon polyps Recent hospitalization 7/24-7/27 hematemesis nausea = gastritis with superficial erosions large hiatal hernia risk for volvulus-was transfused at that time Readmit 03/13/2021 grossly positive stool Rx Lokelma NovoLog and was managed conservatively diet was advanced-was started on midodrine for hypotension-was discharged on at that time on oral Augmentin for possible diverticulitis--she also had a thrombosed right brachiobasilic  aVF to be addressed in the outpatient setting by vascular surgery  Readmit 04/13/2021 for daily nausea vomiting inability tolerate any po CT scan showed acute uncomplicated diverticulitis distal colon rectosigmoid no perforation fluid collection--L1 vertebral body worrisome for nondisplaced insufficiency fracture Potassium 6.1 Hemoglobin on admission 10.3 She was treated with Rocephin Flagyl and Pepcid   She improved over hospital course as below  Hospital Course:  Recurrent diverticulitis/prior gastritis with hematemesis Large hiatal hernia at risk for volvulus Complete antibiotics ?close colonoscopy as OP IV Abx to complete 9/16 Continue Protonix 40 daily--Carafate should be discontinued given she is a renal patient CT scan abdomen shows resolution of diverticulitis picture Is now tolerating diet and has had no recurrence of symptoms She is stable for discharge Superficial phlebitis LUE from IV Monitor-elevate arm, will d/c in am on doxycyline for 3 days and elevate arm Ultrasound left upper extremity 9/16 did not show any clot so this is probably all phlebitis and secondary to frequent needle draws Will need reevaluation in the outpatient setting Attempt not to do labs in the left upper extremity OSA OHSS BMI 32 Occasional daytime somnolence Resume trilogy vent at night A. fib CHADS2 score >4 Amiodarone 200 a.m., continue Eliquis 2.5 twice daily ESRD TTS [facility] Anemia recent blood loss superimposed on anemia renal disease Secondary hypoparathyroidism Had dialysis 9/13 received albumin midodrine etc. Will need goals adjusted in OP setting  Started this admission on midodrine 10 tid-we will need to continue in the outpatient setting and adjustment of goal weights and volumes at that time--she was getting cough blood pressures which may account for the lower than normal blood pressures She would need access reassessment by vascular surgeon Dr. Donzetta Matters in the outpatient setting as  per his discussion with patient HFpEF Managing volume  with HD Chronic gout Asymptomatic allopurinol on hold Global getting GOC tday as multiple illnesses and large burden of chr illness  Discharge Exam: Vitals:   04/20/21 0552 04/20/21 0827  BP: (!) 111/45   Pulse: 83   Resp:    Temp: 98.1 F (36.7 C)   SpO2: 93% 94%    Subj on day of d/c   Awake coherent still has pain in left upper extremity otherwise fair not eating much no dark stool tarry stool nausea vomiting  General Exam on discharge  Coherent pleasant thick neck Mallampati 4 Chest clear no added sound no rales no rhonchi Abdomen soft no rebound no guarding No lower extremity edema ROM intact S1-S2 no murmur no rub no gallop  Discharge Instructions   Discharge Instructions     Diet - low sodium heart healthy   Complete by: As directed    Increase activity slowly   Complete by: As directed       Allergies as of 04/20/2021   No Known Allergies      Medication List     STOP taking these medications    sucralfate 1 GM/10ML suspension Commonly known as: CARAFATE       TAKE these medications    acetaminophen 325 MG tablet Commonly known as: TYLENOL Take 650 mg by mouth daily as needed for headache (pain).   allopurinol 100 MG tablet Commonly known as: ZYLOPRIM Take 1 tablet (100 mg total) by mouth at bedtime.   amiodarone 200 MG tablet Commonly known as: PACERONE Take 1 tablet (200 mg total) by mouth in the morning.   apixaban 2.5 MG Tabs tablet Commonly known as: ELIQUIS Take 2.5 mg by mouth 2 (two) times daily.   atorvastatin 40 MG tablet Commonly known as: LIPITOR Take 40 mg by mouth daily in the afternoon. In the afternoon   Biotin 5000 MCG Tabs Take 5,000 mcg by mouth in the morning.   Clear Eyes for Dry Eyes 1-0.25 % Soln Generic drug: Carboxymethylcellul-Glycerin Place 1 drop into both eyes daily as needed (dry eys).   doxycycline 100 MG tablet Commonly known as:  ADOXA Take 1 tablet (100 mg total) by mouth 2 (two) times daily.   esomeprazole 40 MG capsule Commonly known as: NEXIUM Take 1 capsule (40 mg total) by mouth 2 (two) times daily before a meal.   ferric citrate 1 GM 210 MG(Fe) tablet Commonly known as: AURYXIA Take 210 mg by mouth 3 (three) times daily with meals.   gabapentin 100 MG capsule Commonly known as: NEURONTIN Take 100 mg by mouth at bedtime.   iron sucrose in sodium chloride 0.9 % 100 mL Iron Sucrose (Venofer)   midodrine 10 MG tablet Commonly known as: PROAMATINE Take 1 tablet (10 mg total) by mouth 3 (three) times daily with meals.   nitroGLYCERIN 0.4 MG SL tablet Commonly known as: NITROSTAT Place 0.4 mg under the tongue every 5 (five) minutes x 3 doses as needed for chest pain.   OXYGEN Inhale 2 L into the lungs See admin instructions. Use every night and as needed during the day   promethazine 12.5 MG tablet Commonly known as: PHENERGAN Take 1 tablet (12.5 mg total) by mouth every 8 (eight) hours as needed for nausea or vomiting.   Salonpas 3.08-10-08 % Ptch Generic drug: Camphor-Menthol-Methyl Sal Place 1 patch onto the skin daily as needed (pain).   Trelegy Ellipta 100-62.5-25 MCG/INH Aepb Generic drug: Fluticasone-Umeclidin-Vilant Inhale 1 puff into the lungs daily at 12 noon.  Vitamin D3 50 MCG (2000 UT) Tabs Take 2,000 Units by mouth in the morning.       No Known Allergies    The results of significant diagnostics from this hospitalization (including imaging, microbiology, ancillary and laboratory) are listed below for reference.    Significant Diagnostic Studies: CT ABDOMEN PELVIS WO CONTRAST  Result Date: 04/13/2021 CLINICAL DATA:  Acute nonlocalized abdominal pain EXAM: CT ABDOMEN AND PELVIS WITHOUT CONTRAST TECHNIQUE: Multidetector CT imaging of the abdomen and pelvis was performed following the standard protocol without IV contrast. Unenhanced CT was performed per clinician order. Lack  of IV contrast limits sensitivity and specificity, especially for evaluation of abdominal/pelvic solid viscera. COMPARISON:  03/17/2021 FINDINGS: Lower chest: No acute pleural or parenchymal lung disease. Large hiatal hernia is again identified unchanged. Hepatobiliary: Gallbladder is surgically absent. No gross liver abnormalities on this unenhanced exam. Pancreas: Unremarkable. No pancreatic ductal dilatation or surrounding inflammatory changes. Spleen: Normal in size without focal abnormality. Adrenals/Urinary Tract: Marked bilateral renal cortical atrophy compatible with end-stage renal disease. No urinary tract calculi or obstructive uropathy. The adrenals are unremarkable. Bladder is decompressed, limiting its evaluation. Stomach/Bowel: There is diffuse colonic diverticulosis, with with superimposed wall thickening involving the distal descending colon through the rectosigmoid junction, consistent with acute uncomplicated diverticulitis. No bowel obstruction or ileus. Vascular/Lymphatic: Diffuse atherosclerosis throughout the aorta and its distal branches unchanged. No pathologic adenopathy within the abdomen or pelvis. Reproductive: Status post hysterectomy. No adnexal masses. Other: No free fluid or free gas.  No abdominal wall hernia. Musculoskeletal: There is increasing linear sclerosis within the central aspect of the L1 vertebral body, worrisome for developing insufficiency fracture. There is no significant loss of height or visible fracture line since prior study. Follow-up nonemergent MRI or bone scan could be considered if patient has clinical symptoms of lower back pain. Stable postsurgical changes at L4-5. No other acute bony abnormalities. Reconstructed images demonstrate no additional findings. IMPRESSION: 1. Acute uncomplicated diverticulitis involving the distal descending colon through the rectosigmoid junction. No perforation, fluid collection, or abscess. 2. Increasing linear sclerosis within  the central aspect of the L1 vertebral body, worrisome for developing nondisplaced insufficiency fracture. If patient has symptoms localizing to this region, follow-up nonemergent MRI or bone scan could be considered. 3. Stable hiatal hernia. 4.  Aortic Atherosclerosis (ICD10-I70.0). Electronically Signed   By: Randa Ngo M.D.   On: 04/13/2021 17:43   DG Chest 2 View  Result Date: 04/13/2021 CLINICAL DATA:  Nausea EXAM: CHEST - 2 VIEW COMPARISON:  03/15/2021, CT 03/17/2021, radiograph 04/24/2020, 07/19/2020 FINDINGS: Left-sided central venous catheter tip over the SVC. No focal consolidation, pleural effusion or pneumothorax. Stable cardiomediastinal silhouette with large hiatal hernia contributing to right basilar density. No pneumothorax. IMPRESSION: No active cardiopulmonary disease.  Large hiatal hernia Electronically Signed   By: Donavan Foil M.D.   On: 04/13/2021 17:26   CT ABDOMEN PELVIS W CONTRAST  Result Date: 04/18/2021 CLINICAL DATA:  Rule out complicated diverticulitis. EXAM: CT ABDOMEN AND PELVIS WITH CONTRAST TECHNIQUE: Multidetector CT imaging of the abdomen and pelvis was performed using the standard protocol following bolus administration of intravenous contrast. CONTRAST:  110mL OMNIPAQUE IOHEXOL 350 MG/ML SOLN COMPARISON:  Five days ago FINDINGS: Lower chest:  Large hiatal hernia.  Atelectasis at the lung bases. Hepatobiliary: No focal liver abnormality.Cholecystectomy. No bile duct dilatation. Pancreas: Generalized atrophy. Spleen: Unremarkable. Adrenals/Urinary Tract: Negative adrenals. No hydronephrosis or stone. Symmetric renal atrophy. Largely collapsed urinary bladder with stable appearance Stomach/Bowel: Improved, nearly completely resolved  diverticulitis. No perforation or abscess. Colonic diverticulosis is extensive. Large sliding hiatal hernia with intrathoracic stomach. Vascular/Lymphatic: No acute vascular abnormality. Extensive atheromatous calcification. No mass or  adenopathy. Reproductive:Hysterectomy Other: No ascites or pneumoperitoneum. Musculoskeletal: L1 compression fracture with nonacute appearance and mild height loss. L4-5 PLIF with solid arthrodesis. IMPRESSION: 1. Essentially resolved sigmoid diverticulitis. 2. Interval atelectasis at the bases. 3. Large hiatal hernia. 4.  Aortic Atherosclerosis (ICD10-I70.0). Electronically Signed   By: Jorje Guild M.D.   On: 04/18/2021 06:19   VAS US DUPLEX DIALYSIS ACCESS (AVF,AVG)  Result Date: 04/02/2021 DIALYSIS ACCESS Patient Name:  AGUSTA HACKENBERG  Date of Exam:   04/02/2021 Medical Rec #: [id number]           Accession #:    [id number] Date of Birth: 01-Oct-1945           Patient Gender: F Patient Age:   75 years Exam Location:  [facility] Procedure:      VAS US DUPLEX DIALYSIS ACCESS (AVF, AVG) Referring Phys: Jamelle Haring --------------------------------------------------------------------------------  Reason for Exam: Routine follow up. Access Site: Right Upper Extremity. 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 Performing Technologist: Ronal Fear RVS, RCS  Examination Guidelines: A complete evaluation includes B-mode imaging, spectral Doppler, color Doppler, and power Doppler as needed of all accessible portions of each vessel. Unilateral testing is considered an integral part of a complete examination. Limited examinations for reoccurring indications may be performed as noted.  Findings: +--------------------+----------+-----------------+--------+ AVF                 PSV (cm/s)Flow Vol (mL/min)Comments +--------------------+----------+-----------------+--------+ Native artery inflow   112           91                 +--------------------+----------+-----------------+--------+ AVF Anastomosis        232                              +--------------------+----------+-----------------+--------+  +------------+----------+-------------+----------+-----------------------------+  OUTFLOW VEINPSV (cm/s)Diameter (cm)Depth (cm)          Describe            +------------+----------+-------------+----------+-----------------------------+ Prox UA        128        0.62        1.80                                 +------------+----------+-------------+----------+-----------------------------+ Mid UA          96        0.42        1.06                                 +------------+----------+-------------+----------+-----------------------------+ Dist UA         57        0.46        0.33                                 +------------+----------+-------------+----------+-----------------------------+ AC Fossa       596        0.31        0.78      change in Diameter and  partially-occlusive      +------------+----------+-------------+----------+-----------------------------+   Summary: Arteriovenous fistula-Elevated velocities and narrowed vessel lumen observed involving the first 2.5 cm of the basilic outflow vein from the anastomosis. Diminished flow volume. *See table(s) above for measurements and observations.  Diagnosing physician: Harold Barban MD Electronically signed by Harold Barban MD on 04/02/2021 at 7:45:05 PM.    --------------------------------------------------------------------------------   Final    VAS [place] UPPER EXTREMITY VENOUS DUPLEX  Result Date: 04/20/2021 UPPER VENOUS STUDY  Patient Name:  SOFIAH LYNE  Date of Exam:   04/20/2021 Medical Rec #: [id number]           Accession #:    [id number] Date of Birth: [birth year]           Patient Gender: F Patient Age:   45 years Exam Location[facility]  [facility] Procedure:      VAS [place] UPPER EXTREMITY VENOUS DUPLEX Referring Phys: Nita Sells --------------------------------------------------------------------------------  Indications: Pain, Swelling, and Edema Comparison Study: no prior Performing Technologist: Archie Patten RVS   Examination Guidelines: A complete evaluation includes B-mode imaging, spectral Doppler, color Doppler, and power Doppler as needed of all accessible portions of each vessel. Bilateral testing is considered an integral part of a complete examination. Limited examinations for reoccurring indications may be performed as noted.  Right Findings: +----------+------------+---------+-----------+----------+-------+ RIGHT     CompressiblePhasicitySpontaneousPropertiesSummary +----------+------------+---------+-----------+----------+-------+ Subclavian    Full       Yes       Yes                      +----------+------------+---------+-----------+----------+-------+  Left Findings: +----------+------------+---------+-----------+----------+-------+ LEFT      CompressiblePhasicitySpontaneousPropertiesSummary +----------+------------+---------+-----------+----------+-------+ IJV           Full       Yes       Yes                      +----------+------------+---------+-----------+----------+-------+ Subclavian    Full       Yes       Yes                      +----------+------------+---------+-----------+----------+-------+ Axillary      Full       Yes       Yes                      +----------+------------+---------+-----------+----------+-------+ Brachial      Full       Yes       Yes                      +----------+------------+---------+-----------+----------+-------+ Radial        Full                                          +----------+------------+---------+-----------+----------+-------+ Ulnar         Full                                          +----------+------------+---------+-----------+----------+-------+ Cephalic      Full                                          +----------+------------+---------+-----------+----------+-------+  Basilic       Full                                           +----------+------------+---------+-----------+----------+-------+  Summary:  Right: No evidence of deep vein thrombosis in the upper extremity.  Left: No evidence of deep vein thrombosis in the upper extremity. No evidence of superficial vein thrombosis in the upper extremity.  *See table(s) above for measurements and observations.    Preliminary     Microbiology: Recent Results (from the past 240 hour(s))  Resp Panel by RT-PCR (Flu A&B, Covid) Nasopharyngeal Swab     Status: None   Collection Time: 04/13/21  5:38 PM   Specimen: Nasopharyngeal Swab; Nasopharyngeal(NP) swabs in vial transport medium  Result Value Ref Range Status   SARS Coronavirus 2 by RT PCR NEGATIVE NEGATIVE Final    Comment: (NOTE) SARS-CoV-2 target nucleic acids are NOT DETECTED.  The SARS-CoV-2 RNA is generally detectable in upper respiratory specimens during the acute phase of infection. The lowest concentration of SARS-CoV-2 viral copies this assay can detect is 138 copies/mL. A negative result does not preclude SARS-Cov-2 infection and should not be used as the sole basis for treatment or other patient management decisions. A negative result may occur with  improper specimen collection/handling, submission of specimen other than nasopharyngeal swab, presence of viral mutation(s) within the areas targeted by this assay, and inadequate number of viral copies(<138 copies/mL). A negative result must be combined with clinical observations, patient history, and epidemiological information. The expected result is Negative.  Fact Sheet for Patients:  EntrepreneurPulse.com.au  Fact Sheet for Healthcare Providers:  IncredibleEmployment.be  This test is no t yet approved or cleared by the [place] FDA and  has been authorized for detection and/or diagnosis of SARS-CoV-2 by FDA under an Emergency Use Authorization (EUA). This EUA will remain  in effect (meaning this test can be  used) for the duration of the COVID-19 declaration under Section 564(b)(1) of the Act, 21 U.S.C.section 360bbb-3(b)(1), unless the authorization is terminated  or revoked sooner.       Influenza A by PCR NEGATIVE NEGATIVE Final   Influenza B by PCR NEGATIVE NEGATIVE Final    Comment: (NOTE) The Xpert Xpress SARS-CoV-2/FLU/RSV plus assay is intended as an aid in the diagnosis of influenza from Nasopharyngeal swab specimens and should not be used as a sole basis for treatment. Nasal washings and aspirates are unacceptable for Xpert Xpress SARS-CoV-2/FLU/RSV testing.  Fact Sheet for Patients: EntrepreneurPulse.com.au  Fact Sheet for Healthcare Providers: IncredibleEmployment.be  This test is not yet approved or cleared by the [place] FDA and has been authorized for detection and/or diagnosis of SARS-CoV-2 by FDA under an Emergency Use Authorization (EUA). This EUA will remain in effect (meaning this test can be used) for the duration of the COVID-19 declaration under Section 564(b)(1) of the Act, 21 U.S.C. section 360bbb-3(b)(1), unless the authorization is terminated or revoked.  Performed at [facility], [address] [place], [place] [address]      Labs: Basic Metabolic Panel: Recent Labs  Lab 04/15/21 0242 04/17/21 0434 04/17/21 2019 04/18/21 0256 04/19/21 0308  NA 136 137 134* 136 135  K 4.3 4.5 3.5 3.5 3.9  CL 103 99 96* 97* 98  CO2 27 25 27 27 26   GLUCOSE 84 99 87 86 99  BUN 14 25* 8 8  12  CREATININE 4.89* 8.05* 3.37* 3.95* 5.40*  CALCIUM 7.7* 8.3* 7.4* 7.6* 8.1*  PHOS 3.0 4.0  --   --   --    Liver Function Tests: Recent Labs  Lab 04/13/21 1608 04/15/21 0242 04/17/21 0434 04/17/21 2019 04/18/21 0256 04/19/21 0308  AST 14*  --   --  12* 8* 11*  ALT 10  --   --  7 7 8   ALKPHOS 84  --   --  65 63 68  BILITOT 0.8  --   --  1.0 1.0 0.7  PROT 5.9*  --   --  5.3* 5.0* 5.0*  ALBUMIN 2.0* 1.5* 1.7* 2.1*  2.2* 2.1*   Recent Labs  Lab 04/13/21 1608  LIPASE 29   No results for input(s): AMMONIA in the last 168 hours. CBC: Recent Labs  Lab 04/15/21 0242 04/17/21 0434 04/17/21 2019 04/18/21 0256 04/19/21 0308  WBC 13.3* 15.5* 19.4* 18.2* 14.3*  NEUTROABS 10.4* 12.5* 16.5* 15.5* 11.6*  HGB 8.4* 9.4* 8.6* 8.0* 8.3*  HCT 26.5* 29.9* 26.1* 25.1* 25.6*  MCV 90.4 91.2 90.3 91.9 91.1  PLT 223 291 281 250 306   Cardiac Enzymes: No results for input(s): CKTOTAL, CKMB, CKMBINDEX, TROPONINI in the last 168 hours. BNP: BNP (last 3 results) No results for input(s): BNP in the last 8760 hours.  ProBNP (last 3 results) No results for input(s): PROBNP in the last 8760 hours.  CBG: Recent Labs  Lab 04/19/21 1639  GLUCAP 101*       Signed:  Nita Sells MD   [facility] 04/20/2021, 11:48 AM

## 2021-04-20 NOTE — TOC Progression Note (Signed)
Transition of Care [facility]) - Progression Note    Patient Details  Name: [name] MRN: [id number] Date of Birth: [birth year]  Transition of [facility] CM/SW [place], [place] Phone Number: 04/20/2021, 4:01 PM  Clinical Narrative:    CSW received updated dialysis information. CSW attempted several times to update facility, no answer, unable to leave a VM. TOC will need to follow up with facility tomorrow to update them. TOC will continue to follow.   Expected Discharge Plan: Skilled Nursing Facility Barriers to Discharge: Barriers Resolved  Expected Discharge Plan and Services Expected Discharge Plan: [place] arrangements for the past 2 months: [place] Expected Discharge Date: 04/20/21                                     Social Determinants of Health (SDOH) Interventions    Readmission Risk Interventions Readmission Risk Prevention Plan 07/20/2020 04/28/2020 03/08/2020  Transportation Screening Complete Complete Complete  PCP or Specialist Appt within 3-5 Days - Not Complete -  Not Complete comments - going to SNF -  [place] or [place] - Complete -  [place] or Home Care Consult comments - - -  Social Work Consult for [place] Planning/Counseling - Complete -  Palliative Care Screening - Not Applicable -  Medication Review (RN Care Manager) Referral to Pharmacy Complete Complete  PCP or Specialist appointment within 3-5 days of discharge Complete - Complete  PCP/Specialist Appt Not Complete comments - - -  [place] or Home Care Consult Complete - Complete  SW Recovery Care/Counseling Consult Complete - Complete  Palliative Care Screening Not Applicable - Not [place] Not Applicable - Not Applicable  Some recent data might be hidden

## 2021-04-20 NOTE — Progress Notes (Signed)
Upper extremity venous has been completed.   Preliminary results in CV Proc.   [name] 04/20/2021 11:44 AM

## 2021-04-20 NOTE — Progress Notes (Addendum)
Received call from [facility] regarding out-pt HD center change at d/c. Pt is being d/c to [facility]. Pt receives out-pt HD at [facility] on TTS. CSW states snf can provide transport to [facility] ([place]). Spoke to clinic manager, Lenna Sciara, at [facility] who states they can accept pt as a transient (12 or less HD treatments) on TTS but cannot start pt tomorrow. Pt cannot start until Tuesday. Also, Tax inspector will have to accept pt. Spoke to [employer], Quarry manager, at [facility] who will provide necessary documents to [place] to review. CSW provided update. Will f/u with Melissa at [facility] this afternoon regarding acceptance of pt and to obtain pt's days/time. Spoke to Dr Royce Macadamia regarding pt's case as well.  Melven Sartorius Renal Navigator [phone]  Addendum at 3:28 pm:  New Out-pt HD Arrangements: [facility] [phone]) [phone] TTS- arrive at 11:45 with a 12:00 chair time. On pt's first treatment day 9/20, pt will need to arrive at 11:30. Pt will need to bring ID and insurance card to first appt.  Spoke to [place] at [facility] to confirm arrangements. Clinic is expecting pt on Tuesday. Clinic will need orders at d/c and Shirlee Limerick with [facility] aware.  Update provided to [place], [place] to pt via phone to provide update on arrangements.

## 2021-04-20 NOTE — Progress Notes (Addendum)
[facility] Progress Note   Subjective:    Patient seen and examined at bedside. Completed dialysis yesterday -minimal UF d/t hypotension.  No complaints this am.    Objective Vitals:   04/19/21 2025 04/19/21 2340 04/20/21 0552 04/20/21 0827  BP: (!) 108/46  (!) 111/45   Pulse: 81 75 83   Resp: 16 16    Temp: 98.1 F (36.7 C)  98.1 F (36.7 C)   TempSrc: Oral  Oral   SpO2: 96% 97% 93% 94%  Weight:      Height:       Physical Exam General: Chronically-ill appearing; NAD Heart: S1 and S2; No murmurs, gallops, or rubs Lungs: Clear throughout; No wheezing, rales, or rhonchi Abdomen: Large, soft, and non-tender Extremities: 1+ LUE edema; no edema BLLE or hips Dialysis Access: [facility]; R AVF (-) Bruit/Thrill-not in use   Filed Weights   04/17/21 1457 04/17/21 1850 04/19/21 1330  Weight: 66.5 kg 66.5 kg 69 kg    Intake/Output Summary (Last 24 hours) at 04/20/2021 1053 Last data filed at 04/19/2021 2000 Gross per 24 hour  Intake 120 ml  Output 435 ml  Net -315 ml     Additional Objective Labs: Basic Metabolic Panel: Recent Labs  Lab 04/15/21 0242 04/17/21 0434 04/17/21 2019 04/18/21 0256 04/19/21 0308  NA 136 137 134* 136 135  K 4.3 4.5 3.5 3.5 3.9  CL 103 99 96* 97* 98  CO2 27 25 27 27 26   GLUCOSE 84 99 87 86 99  BUN 14 25* 8 8 12   CREATININE 4.89* 8.05* 3.37* 3.95* 5.40*  CALCIUM 7.7* 8.3* 7.4* 7.6* 8.1*  PHOS 3.0 4.0  --   --   --     Liver Function Tests: Recent Labs  Lab 04/17/21 2019 04/18/21 0256 04/19/21 0308  AST 12* 8* 11*  ALT 7 7 8   ALKPHOS 65 63 68  BILITOT 1.0 1.0 0.7  PROT 5.3* 5.0* 5.0*  ALBUMIN 2.1* 2.2* 2.1*    Recent Labs  Lab 04/13/21 1608  LIPASE 29    CBC: Recent Labs  Lab 04/15/21 0242 04/17/21 0434 04/17/21 2019 04/18/21 0256 04/19/21 0308  WBC 13.3* 15.5* 19.4* 18.2* 14.3*  NEUTROABS 10.4* 12.5* 16.5* 15.5* 11.6*  HGB 8.4* 9.4* 8.6* 8.0* 8.3*  HCT 26.5* 29.9* 26.1* 25.1* 25.6*  MCV 90.4 91.2  90.3 91.9 91.1  PLT 223 291 281 250 306    Blood Culture    Component Value Date/Time   SDES SYNOVIAL 04/25/2020 1354   SPECREQUEST Normal 04/25/2020 1354   CULT  04/25/2020 1354    NO GROWTH Performed at [facility], [place] [address], [place], [place] [address]    REPTSTATUS 04/28/2020 FINAL 04/25/2020 1354    Cardiac Enzymes: No results for input(s): CKTOTAL, CKMB, CKMBINDEX, TROPONINI in the last 168 hours. CBG: Recent Labs  Lab 04/19/21 1639  GLUCAP 101*   Iron Studies: No results for input(s): IRON, TIBC, TRANSFERRIN, FERRITIN in the last 72 hours. Lab Results  Component Value Date   INR 1.1 04/13/2021   INR 1.5 (H) 04/24/2020   INR 1.0 02/10/2020   Studies/Results: No results found.  Medications:  sodium chloride      amiodarone  200 mg Oral q AM   apixaban  2.5 mg Oral BID   Chlorhexidine Gluconate Cloth  6 each Topical Daily   [START ON 04/21/2021] darbepoetin (ARANESP) injection - DIALYSIS  100 mcg Intravenous Q Sat-HD   feeding supplement  1 Container Oral  TID BM   fluticasone furoate-vilanterol  1 puff Inhalation Daily   And   umeclidinium bromide  1 puff Inhalation Daily   midodrine  10 mg Oral TID WC   pantoprazole (PROTONIX) IV  40 mg Intravenous Q24H   sodium chloride flush  3 mL Intravenous Q12H    Dialysis Orders: TTS - [facility]  4hrs, BFR 400, DFR 800,  EDW 77, 2K/ 2.5Ca   Access: TDC  Heparin none Mircera 225 mcg q2wks - last 3mcg on 7/14 Calcitriol 1.25 mcg PO qHD  Assessment/Plan: Intractable n/v/d - in setting of recurrent diverticulitis. Improved.  Diverticulitis - recurrent, noted on CT scan.  Treated with IV ABX-continue Augmentin course completed. Now on Ampicillin IV X 3 doses d/t recurrent symptoms. CT ABD/Pelvis: resolved sigmoid diverticulitis and large hiatal hernia. Resume diet. Management per primary. GERD with esophagitis- would avoid Carafate for ESRD patient d/t risk for aluminum toxicity. Continue PPI and  Zofran. 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 Plan for ongoing discussion at outpatient with her and family-patient wants more time to think things over with family before making a final decision.  Hyperkalemia - Resolved. Treated medically and with HD.   ESRD -  on HD TTS. Next HD 9/17 HypOtension/volume  - Blood pressure soft. Hypotension is an issue for her on HD even in outpatient. On midodrine. CXR with no indication of volume overload. Edema Les improving. UF as tolerated.  Anemia of CKD - Hgb  8.3. Aranesp 100 q week. Next due on 9/17.  Secondary Hyperparathyroidism -  Correct Ca ok and phos ok. Continue binders, VDRA Nutrition - renal diet w/fluid restrictions once advanced.  DMT2 - per primary COPD Disposition-Patient seen by palliative care yesterday-GOC discussed-remains full code. Awaiting SNF placement.   Lynnda Child PA-C [facility] 04/20/2021,10:53 AM  Seen and examined independently.  Agree with note and exam as documented above by physician extender and as noted here.  States going to SNF today.   General adult female in bed in no acute distress HEENT normocephalic atraumatic extraocular movements intact sclera anicteric Neck supple trachea midline Lungs clear to auscultation bilaterally normal work of breathing at rest  Heart S1S2 no rub Abdomen soft nontender nondistended Extremities no pitting edema  Psych normal mood and affect Neuro - alert and oriented x 3 provides hx and follows commands Access: left IJ tunn catheter   Diverticulitis per primary team.  improved per scan   Thrombosed AVF - multiple prior failed accesses.  next option would be RUE AVG. She states will discuss with vascular after she's out of SNF   Chronic hypotension on midodrine   ESRD on HD - TTS.  She has expressed to me that at this time she does wish to continue with HD and "things would have to get a whole lot worse" before she would consider  stopping   Anemia CKD - increased mircera dose for 9/17 to 100 mcg. Follow trends  I have contacted HD SW to let her know of planned discharge to SNF to ensure we have everything coordinated for HD  Claudia Desanctis, MD 04/20/2021  12:01 PM

## 2021-04-20 NOTE — Progress Notes (Signed)
Pt states son took home trilogy unit today. Pt is refusing hospital bipap. RN aware, RT will cont to monitor. Pt is resting comfortably on room air.

## 2021-04-21 LAB — CBC WITH DIFFERENTIAL/PLATELET
Abs Immature Granulocytes: 0.27 10*3/uL — ABNORMAL HIGH (ref 0.00–0.07)
Basophils Absolute: 0 10*3/uL (ref 0.0–0.1)
Basophils Relative: 0 %
Eosinophils Absolute: 0.1 10*3/uL (ref 0.0–0.5)
Eosinophils Relative: 1 %
HCT: 26.7 % — ABNORMAL LOW (ref 36.0–46.0)
Hemoglobin: 8.4 g/dL — ABNORMAL LOW (ref 12.0–15.0)
Immature Granulocytes: 2 %
Lymphocytes Relative: 4 %
Lymphs Abs: 0.5 10*3/uL — ABNORMAL LOW (ref 0.7–4.0)
MCH: 29.2 pg (ref 26.0–34.0)
MCHC: 31.5 g/dL (ref 30.0–36.0)
MCV: 92.7 fL (ref 80.0–100.0)
Monocytes Absolute: 0.9 10*3/uL (ref 0.1–1.0)
Monocytes Relative: 7 %
Neutro Abs: 10.8 10*3/uL — ABNORMAL HIGH (ref 1.7–7.7)
Neutrophils Relative %: 86 %
Platelets: 324 10*3/uL (ref 150–400)
RBC: 2.88 MIL/uL — ABNORMAL LOW (ref 3.87–5.11)
RDW: 17 % — ABNORMAL HIGH (ref 11.5–15.5)
WBC: 12.6 10*3/uL — ABNORMAL HIGH (ref 4.0–10.5)
nRBC: 0 % (ref 0.0–0.2)

## 2021-04-21 LAB — COMPREHENSIVE METABOLIC PANEL
ALT: 7 U/L (ref 0–44)
AST: 8 U/L — ABNORMAL LOW (ref 15–41)
Albumin: 2.2 g/dL — ABNORMAL LOW (ref 3.5–5.0)
Alkaline Phosphatase: 63 U/L (ref 38–126)
Anion gap: 11 (ref 5–15)
BUN: 12 mg/dL (ref 8–23)
CO2: 27 mmol/L (ref 22–32)
Calcium: 8.1 mg/dL — ABNORMAL LOW (ref 8.9–10.3)
Chloride: 96 mmol/L — ABNORMAL LOW (ref 98–111)
Creatinine, Ser: 4.5 mg/dL — ABNORMAL HIGH (ref 0.44–1.00)
GFR, Estimated: 10 mL/min — ABNORMAL LOW (ref >60)
Glucose, Bld: 75 mg/dL (ref 70–99)
Potassium: 3.4 mmol/L — ABNORMAL LOW (ref 3.5–5.1)
Sodium: 134 mmol/L — ABNORMAL LOW (ref 135–145)
Total Bilirubin: 1.2 mg/dL (ref 0.3–1.2)
Total Protein: 5.4 g/dL — ABNORMAL LOW (ref 6.5–8.1)

## 2021-04-21 LAB — PHOSPHORUS: Phosphorus: 3.2 mg/dL (ref 2.5–4.6)

## 2021-04-21 MED ORDER — POLYETHYLENE GLYCOL 3350 17 G PO PACK: 17.0000 g | PACK | Freq: Every day | ORAL | Status: DC

## 2021-04-21 NOTE — Progress Notes (Signed)
OT Cancellation Note  Patient Details Name: [name] MRN: [id number] DOB: 05-12-1946   Cancelled Treatment:    Reason Eval/Treat Not Completed: Patient at procedure or test/ unavailable Pt off unit for HD this AM. Will follow-up for OT session as schedule permits  Layla Maw 04/21/2021, 8:19 AM

## 2021-04-21 NOTE — TOC Transition Note (Signed)
Transition of Care [facility]) - CM/SW Discharge Note   Patient Details  Name: [name] MRN: [id number] Date of Birth: 11/24/1945  Transition of Care [facility]) CM/SW Contact:  Joanne Chars, LCSW Phone Number: 04/21/2021, 12:22 PM   Clinical Narrative:  Pt discharging to [facility] room 403A.  RN call report to [phone].     Final next level of care: Skilled Nursing Facility Barriers to Discharge: Barriers Resolved   Patient Goals and CMS Choice Patient states their goals for this hospitalization and ongoing recovery are:: to get better CMS Medicare.gov Compare Post Acute Care list provided to:: Patient Choice offered to / list presented to : Patient  Discharge Placement              Patient chooses bed at:  [facility]) Patient to be transferred to facility by: [facility] Name of family member notified: LM son [name], no answer son [name] Patient and family notified of of transfer: 04/21/21  Discharge Plan and Services                                     Social Determinants of Health (SDOH) Interventions     Readmission Risk Interventions Readmission Risk Prevention Plan 07/20/2020 04/28/2020 03/08/2020  Transportation Screening Complete Complete Complete  PCP or Specialist Appt within 3-5 Days - Not Complete -  Not Complete comments - going to SNF -  [place] or [place] or Home Care Consult comments - - -  Social Work Consult for [place] Planning/Counseling - Complete -  [place] - Not Applicable -  Medication Review Press photographer) Referral to Pharmacy Complete Complete  PCP or Specialist appointment within 3-5 days of discharge Complete - Complete  PCP/Specialist Appt Not Complete comments - - -  [place] or Home Care Consult Complete - Complete  SW Recovery Care/Counseling Consult Complete - Complete  Palliative Care Screening Not Applicable - Not [place] Not Applicable -  Not Applicable  Some recent data might be hidden

## 2021-04-21 NOTE — Progress Notes (Signed)
[name] to be D/C'd  per MD order.  Discussed with the patient and all questions fully answered.  VSS, Skin clean, dry and intact without evidence of skin break down, no evidence of skin tears noted.  IV catheter discontinued intact. Site without signs and symptoms of complications. Dressing and pressure applied.  An After Visit Summary was printed and given to [facility] for receiving facility.  D/c education completed with patient/family including follow up instructions, medication list, d/c activities limitations if indicated, with other d/c instructions as indicated by MD - patient able to verbalize understanding, all questions fully answered.   Patient to be escorted via PTAR and D/C to [facility]. Facility updated on pts departure from unit.

## 2021-04-21 NOTE — Progress Notes (Signed)
   04/21/21 1029  Vitals  Temp 97.7 F (36.5 C)  Temp Source Oral  BP (!) 109/39  BP Location Right Wrist  BP Method Automatic  Patient Position (if appropriate) Lying  Pulse Rate 76  Pulse Rate Source Monitor  ECG Heart Rate 79  Resp 15  Oxygen Therapy  SpO2 96 %  O2 Device Room Air  Dialysis Weight  Weight 63.7 kg  Type of Weight Post-Dialysis  Post-Hemodialysis Assessment  Rinseback Volume (mL) 250 mL  KECN 265 V  Dialyzer Clearance Lightly streaked  Duration of HD Treatment -hour(s) 2.5 hour(s)  Hemodialysis Intake (mL) 500 mL  UF Total -Machine (mL) 1645 mL  Net UF (mL) 1145 mL  Tolerated HD Treatment Yes  Post-Hemodialysis Comments tx complete-pt stable  Hemodialysis Catheter Left Internal jugular Double lumen Permanent (Tunneled)  Placement Date/Time: 07/29/19 1447   Time Out: Correct patient;Correct site;Correct procedure  Maximum sterile barrier precautions: Hand hygiene;Cap;Mask;Sterile gown;Sterile gloves;Large sterile sheet  Site Prep: Chlorhexidine (preferred)  Local Anes...  Site Condition No complications  Blue Lumen Status Flushed;Dead end cap in place;Heparin locked  Purple Lumen Status Flushed;Heparin locked;Dead end cap in place  Catheter fill solution Heparin 1000 units/ml  Catheter fill volume (Arterial) 1.9 cc  Catheter fill volume (Venous) 1.9  Dressing Type Transparent  Dressing Status Clean;Dry;Intact  Antimicrobial disc in place? Yes  Dressing Change Due 04/21/21  Post treatment catheter status Capped and Clamped  TX complete, pt stable. HD tx time decreased by 1.5 hours per Dr. Jonnie Finner.

## 2021-04-21 NOTE — Progress Notes (Signed)
[facility] Progress Note   Subjective:    Seen in HD unit. UF goal 2L. Tolerating UF so far. No complaints this am, just cold. Plans for discharge after HD today    Objective Vitals:   04/21/21 0740 04/21/21 0745 04/21/21 0800 04/21/21 0830  BP: (!) 126/37 (!) 143/47 (!) 128/33 (!) 133/39  Pulse: 76 76 77 74  Resp: 20 19 16 16   Temp:      TempSrc:      SpO2:      Weight:      Height:       Physical Exam General: Chronically-ill appearing; NAD Heart: S1 and S2; No murmurs, gallops, or rubs Lungs: Clear throughout; No wheezing, rales, or rhonchi Abdomen: Large, soft, and non-tender Extremities: No sig LE edema  Dialysis Access: L IJ TDC; R AVF (-) Bruit/Thrill-not in use   Filed Weights   04/17/21 1850 04/19/21 1330 04/21/21 0735  Weight: 66.5 kg 69 kg 67 kg    Intake/Output Summary (Last 24 hours) at 04/21/2021 0901 Last data filed at 04/20/2021 1848 Gross per 24 hour  Intake 200 ml  Output --  Net 200 ml     Additional Objective Labs: Basic Metabolic Panel: Recent Labs  Lab 04/15/21 0242 04/17/21 0434 04/17/21 2019 04/18/21 0256 04/19/21 0308  NA 136 137 134* 136 135  K 4.3 4.5 3.5 3.5 3.9  CL 103 99 96* 97* 98  CO2 27 25 27 27 26   GLUCOSE 84 99 87 86 99  BUN 14 25* 8 8 12   CREATININE 4.89* 8.05* 3.37* 3.95* 5.40*  CALCIUM 7.7* 8.3* 7.4* 7.6* 8.1*  PHOS 3.0 4.0  --   --   --     Liver Function Tests: Recent Labs  Lab 04/17/21 2019 04/18/21 0256 04/19/21 0308  AST 12* 8* 11*  ALT 7 7 8   ALKPHOS 65 63 68  BILITOT 1.0 1.0 0.7  PROT 5.3* 5.0* 5.0*  ALBUMIN 2.1* 2.2* 2.1*    No results for input(s): LIPASE, AMYLASE in the last 168 hours.  CBC: Recent Labs  Lab 04/17/21 0434 04/17/21 2019 04/18/21 0256 04/19/21 0308 04/21/21 0810  WBC 15.5* 19.4* 18.2* 14.3* 12.6*  NEUTROABS 12.5* 16.5* 15.5* 11.6* 10.8*  HGB 9.4* 8.6* 8.0* 8.3* 8.4*  HCT 29.9* 26.1* 25.1* 25.6* 26.7*  MCV 91.2 90.3 91.9 91.1 92.7  PLT 291 281 250  306 324    Blood Culture    Component Value Date/Time   SDES SYNOVIAL 04/25/2020 1354   SPECREQUEST Normal 04/25/2020 1354   CULT  04/25/2020 1354    NO GROWTH Performed at [facility], [place] [address], [place], [place] [address]    REPTSTATUS 04/28/2020 FINAL 04/25/2020 1354    Cardiac Enzymes: No results for input(s): CKTOTAL, CKMB, CKMBINDEX, TROPONINI in the last 168 hours. CBG: Recent Labs  Lab 04/19/21 1639  GLUCAP 101*    Iron Studies: No results for input(s): IRON, TIBC, TRANSFERRIN, FERRITIN in the last 72 hours. Lab Results  Component Value Date   INR 1.1 04/13/2021   INR 1.5 (H) 04/24/2020   INR 1.0 02/10/2020   Studies/Results: VAS [place] UPPER EXTREMITY VENOUS DUPLEX  Result Date: 04/20/2021 UPPER VENOUS STUDY  Patient Name:  [name]  Date of Exam:   04/20/2021 Medical Rec #: [id number]           Accession #:    [id number] Date of Birth: 04/08/1946           Patient  Gender: F Patient Age:   75 years Exam Location:  [name] Procedure:      VAS [place] UPPER EXTREMITY VENOUS DUPLEX Referring Phys: Nita Sells --------------------------------------------------------------------------------  Indications: Pain, Swelling, and Edema Comparison Study: no prior Performing Technologist: Archie Patten RVS  Examination Guidelines: A complete evaluation includes B-mode imaging, spectral Doppler, color Doppler, and power Doppler as needed of all accessible portions of each vessel. Bilateral testing is considered an integral part of a complete examination. Limited examinations for reoccurring indications may be performed as noted.  Right Findings: +----------+------------+---------+-----------+----------+-------+ RIGHT     CompressiblePhasicitySpontaneousPropertiesSummary +----------+------------+---------+-----------+----------+-------+ Subclavian    Full       Yes       Yes                       +----------+------------+---------+-----------+----------+-------+  Left Findings: +----------+------------+---------+-----------+----------+-------+ LEFT      CompressiblePhasicitySpontaneousPropertiesSummary +----------+------------+---------+-----------+----------+-------+ IJV           Full       Yes       Yes                      +----------+------------+---------+-----------+----------+-------+ Subclavian    Full       Yes       Yes                      +----------+------------+---------+-----------+----------+-------+ Axillary      Full       Yes       Yes                      +----------+------------+---------+-----------+----------+-------+ Brachial      Full       Yes       Yes                      +----------+------------+---------+-----------+----------+-------+ Radial        Full                                          +----------+------------+---------+-----------+----------+-------+ Ulnar         Full                                          +----------+------------+---------+-----------+----------+-------+ Cephalic      Full                                          +----------+------------+---------+-----------+----------+-------+ Basilic       Full                                          +----------+------------+---------+-----------+----------+-------+  Summary:  Right: No evidence of deep vein thrombosis in the upper extremity.  Left: No evidence of deep vein thrombosis in the upper extremity. No evidence of superficial vein thrombosis in the upper extremity.  *See table(s) above for measurements and observations.    Preliminary     Medications:  sodium chloride     sodium chloride  sodium chloride      amiodarone  200 mg Oral q AM   apixaban  2.5 mg Oral BID   Chlorhexidine Gluconate Cloth  6 each Topical Daily   darbepoetin (ARANESP) injection - DIALYSIS  100 mcg Intravenous Q Sat-HD   feeding supplement  1 Container  Oral TID BM   fluticasone furoate-vilanterol  1 puff Inhalation Daily   And   umeclidinium bromide  1 puff Inhalation Daily   midodrine  10 mg Oral TID WC   pantoprazole (PROTONIX) IV  40 mg Intravenous Q24H   sodium chloride flush  3 mL Intravenous Q12H    Dialysis Orders: TTS - [facility]  4hrs, BFR 400, DFR 800,  EDW 77, 2K/ 2.5Ca   Access: TDC  Heparin none Mircera 225 mcg q2wks - last 51mcg on 7/14 Calcitriol 1.25 mcg PO qHD  Assessment/Plan: Intractable n/v/d - in setting of recurrent diverticulitis. Resolved.  Diverticulitis - recurrent, noted on CT scan.  Treated with IV ABX-continue Augmentin course completed. Now on Ampicillin IV X 3 doses d/t recurrent symptoms. CT ABD/Pelvis: resolved sigmoid diverticulitis and large hiatal hernia. Resume diet. Management per primary. GERD with esophagitis- would avoid Carafate for ESRD patient d/t risk for aluminum toxicity. Continue PPI and Zofran. 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 Plan for ongoing discussion at outpatient with her and family-patient wants more time to think things over with family before making a final decision.  Hyperkalemia - Resolved. Treated medically and with HD.   ESRD -  on HD TTS. HD today. Will transfer to [facility] to accommodate SNF -1st treatment there 9/20.  HypOtension/volume  - Blood pressure soft. Hypotension is an issue for her on HD even in outpatient. On midodrine. CXR with no indication of volume overload. Edema improving. UF as tolerated. Is well below outpatient dry weight.  Anemia of CKD - Hgb  8.3. Aranesp 100 q week. Next due on 9/17.  Secondary Hyperparathyroidism -  Correct Ca ok and phos ok. Continue binders, VDRA Nutrition - renal diet w/fluid restrictions once advanced.  DMT2 - per primary COPD Disposition-Full code. Will d/c to [facility] and needs to transfer to new HD center in the interim. Now as a spot at [facility] TTS.   Lynnda Child PA-C [facility] 04/21/2021,9:01 AM

## 2021-04-21 NOTE — Progress Notes (Signed)
Pt uses Home [facility] but son has taken it home and when asked was it brought back pt stated "nope". I asked patient if she would like to try one of our machines and pt responded "nope". Informed pt to call if she changes her mind.

## 2021-04-21 NOTE — Discharge Summary (Signed)
Physician Discharge Summary  KIMBELY WHITEAKER QMV:[id number] DOB: 04/22/1946 DOA: 04/13/2021  PCP: [name], MD  Admit date: 04/13/2021 Discharge date: 04/21/2021  Patient seen examined on HD this morning 9/17 and doing well She has no further issues her left arm seems slightly improved in terms of swelling and pain although it is still there, she has had no further dark or tarry stool and seems stabilized for skilled care Please see exam as below otherwise is stable to discharge to SNF whenever they are able to take her  Time spent: 38 minutes  Recommendations for Outpatient Follow-up:  Needs renal panel iron studies 1 week with CBC differential Recommend outpatient follow-up with nephrology for further goals of care Started on doxycycline 100 twice daily on discharge for superficial phlebitis-please elevate left upper extremity and try to avoid needle sticks other than at dialysis Continue goals of care discussions in the outpatient setting Will need outpatient discussion with vascular surgery for AV graft replacement-they are aware of the same I will CC Dr. Donzetta Matters Suggest outpatient coordination with gastroenterology in the next month or so for work-up of recurrent bleeds ---may require scope if stabilized for the same electively  Discharge Diagnoses:  MAIN problem for hospitalization   Nausea vomiting  Please see below for itemized issues addressed in [place]- refer to other progress notes for clarity if needed  Discharge Condition: Improved but fair overall  Diet recommendation:   Heart healthy sodium restricted  Filed Weights   04/17/21 1850 04/19/21 1330 04/21/21 0735  Weight: 66.5 kg 69 kg 67 kg    History of present illness:  75 year old white female ESRD TTS [facility] A. fib not on anticoagulation secondary to GI bleed OSA on trilogy vent at bedtime Reflux with esophagitis prior colon polyps Recent hospitalization 7/24-7/27 hematemesis nausea = gastritis  with superficial erosions large hiatal hernia risk for volvulus-was transfused at that time Readmit 03/13/2021 grossly positive stool Rx Lokelma NovoLog and was managed conservatively diet was advanced-was started on midodrine for hypotension-was discharged on at that time on oral Augmentin for possible diverticulitis--she also had a thrombosed right brachiobasilic aVF to be addressed in the outpatient setting by vascular surgery  Readmit 04/13/2021 for daily nausea vomiting inability tolerate any po CT scan showed acute uncomplicated diverticulitis distal colon rectosigmoid no perforation fluid collection--L1 vertebral body worrisome for nondisplaced insufficiency fracture Potassium 6.1 Hemoglobin on admission 10.3 She was treated with Rocephin Flagyl and Pepcid   She improved over hospital course as below  Hospital Course:  Recurrent diverticulitis/prior gastritis with hematemesis Large hiatal hernia at risk for volvulus Complete antibiotics ?close colonoscopy as OP IV Abx to complete 9/16 Continue Protonix 40 daily--Carafate should be discontinued given she is a renal patient CT scan abdomen shows resolution of diverticulitis picture Is now tolerating diet and has had no recurrence of symptoms She is stable for discharge Superficial phlebitis LUE from IV Monitor-elevate arm, will d/c in am on doxycyline for 3 days and elevate arm Ultrasound left upper extremity 9/16 did not show any clot so this is probably all phlebitis and secondary to frequent needle draws Will need reevaluation in the outpatient setting Attempt not to do labs in the left upper extremity OSA OHSS BMI 32 Occasional daytime somnolence Resume trilogy vent at night A. fib CHADS2 score >4 Amiodarone 200 a.m., continue Eliquis 2.5 twice daily ESRD TTS [facility] Anemia recent blood loss superimposed on anemia renal disease Secondary hypoparathyroidism Had dialysis 9/13 received albumin midodrine etc. Will need goals  adjusted in OP setting  Started this admission on midodrine 10 tid-we will need to continue in the outpatient setting and adjustment of goal weights and volumes at that time--she was getting cough blood pressures which may account for the lower than normal blood pressures She would need access reassessment by vascular surgeon Dr. Donzetta Matters in the outpatient setting as per his discussion with patient HFpEF Managing volume with HD Chronic gout Asymptomatic allopurinol on hold Global getting [place] tday as multiple illnesses and large burden of chr illness  Discharge Exam: Vitals:   04/21/21 0800 04/21/21 0830  BP: (!) 128/33 (!) 133/39  Pulse: 77 74  Resp: 16 16  Temp:    SpO2:      Subj on day of d/c   Seen on HD unit slightly sleepy no distress no pain no fever  General Exam on discharge  Coherent pleasant  Thick neck no icterus no pallor CTA B no added sound no rhonchi no rales Abdomen obese nontender nondistended no rebound no guarding Neurologically intact moving 4 limbs without specific deficits Swollen left upper extremity but left hand is improved in terms of swelling and discomfort  Discharge Instructions   Discharge Instructions     Diet - low sodium heart healthy   Complete by: As directed    Increase activity slowly   Complete by: As directed       Allergies as of 04/21/2021   No Known Allergies      Medication List     STOP taking these medications    sucralfate 1 GM/10ML suspension Commonly known as: CARAFATE       TAKE these medications    acetaminophen 325 MG tablet Commonly known as: TYLENOL Take 650 mg by mouth daily as needed for headache (pain).   allopurinol 100 MG tablet Commonly known as: ZYLOPRIM Take 1 tablet (100 mg total) by mouth at bedtime.   amiodarone 200 MG tablet Commonly known as: PACERONE Take 1 tablet (200 mg total) by mouth in the morning.   apixaban 2.5 MG Tabs tablet Commonly known as: ELIQUIS Take 2.5 mg by  mouth 2 (two) times daily.   atorvastatin 40 MG tablet Commonly known as: LIPITOR Take 40 mg by mouth daily in the afternoon. In the afternoon   Biotin 5000 MCG Tabs Take 5,000 mcg by mouth in the morning.   Clear Eyes for Dry Eyes 1-0.25 % Soln Generic drug: Carboxymethylcellul-Glycerin Place 1 drop into both eyes daily as needed (dry eys).   doxycycline 100 MG tablet Commonly known as: ADOXA Take 1 tablet (100 mg total) by mouth 2 (two) times daily.   esomeprazole 40 MG capsule Commonly known as: NEXIUM Take 1 capsule (40 mg total) by mouth 2 (two) times daily before a meal.   ferric citrate 1 GM 210 MG(Fe) tablet Commonly known as: AURYXIA Take 210 mg by mouth 3 (three) times daily with meals.   gabapentin 100 MG capsule Commonly known as: NEURONTIN Take 100 mg by mouth at bedtime.   iron sucrose in sodium chloride 0.9 % 100 mL Iron Sucrose (Venofer)   midodrine 10 MG tablet Commonly known as: PROAMATINE Take 1 tablet (10 mg total) by mouth 3 (three) times daily with meals.   nitroGLYCERIN 0.4 MG SL tablet Commonly known as: NITROSTAT Place 0.4 mg under the tongue every 5 (five) minutes x 3 doses as needed for chest pain.   OXYGEN Inhale 2 L into the lungs See admin instructions. Use every night and as needed  during the day   promethazine 12.5 MG tablet Commonly known as: PHENERGAN Take 1 tablet (12.5 mg total) by mouth every 8 (eight) hours as needed for nausea or vomiting.   Salonpas 3.08-10-08 % Ptch Generic drug: Camphor-Menthol-Methyl Sal Place 1 patch onto the skin daily as needed (pain).   Trelegy Ellipta 100-62.5-25 MCG/INH Aepb Generic drug: Fluticasone-Umeclidin-Vilant Inhale 1 puff into the lungs daily at 12 noon.   Vitamin D3 50 MCG (2000 UT) Tabs Take 2,000 Units by mouth in the morning.      No Known Allergies    The results of significant diagnostics from this hospitalization (including imaging, microbiology, ancillary and laboratory)  are listed below for reference.    Significant Diagnostic Studies: CT ABDOMEN PELVIS WO CONTRAST  Result Date: 04/13/2021 CLINICAL DATA:  Acute nonlocalized abdominal pain EXAM: CT ABDOMEN AND PELVIS WITHOUT CONTRAST TECHNIQUE: Multidetector CT imaging of the abdomen and pelvis was performed following the standard protocol without IV contrast. Unenhanced CT was performed per clinician order. Lack of IV contrast limits sensitivity and specificity, especially for evaluation of abdominal/pelvic solid viscera. COMPARISON:  03/17/2021 FINDINGS: Lower chest: No acute pleural or parenchymal lung disease. Large hiatal hernia is again identified unchanged. Hepatobiliary: Gallbladder is surgically absent. No gross liver abnormalities on this unenhanced exam. Pancreas: Unremarkable. No pancreatic ductal dilatation or surrounding inflammatory changes. Spleen: Normal in size without focal abnormality. Adrenals/Urinary Tract: Marked bilateral renal cortical atrophy compatible with end-stage renal disease. No urinary tract calculi or obstructive uropathy. The adrenals are unremarkable. Bladder is decompressed, limiting its evaluation. Stomach/Bowel: There is diffuse colonic diverticulosis, with with superimposed wall thickening involving the distal descending colon through the rectosigmoid junction, consistent with acute uncomplicated diverticulitis. No bowel obstruction or ileus. Vascular/Lymphatic: Diffuse atherosclerosis throughout the aorta and its distal branches unchanged. No pathologic adenopathy within the abdomen or pelvis. Reproductive: Status post hysterectomy. No adnexal masses. Other: No free fluid or free gas.  No abdominal wall hernia. Musculoskeletal: There is increasing linear sclerosis within the central aspect of the L1 vertebral body, worrisome for developing insufficiency fracture. There is no significant loss of height or visible fracture line since prior study. Follow-up nonemergent MRI or bone scan  could be considered if patient has clinical symptoms of lower back pain. Stable postsurgical changes at L4-5. No other acute bony abnormalities. Reconstructed images demonstrate no additional findings. IMPRESSION: 1. Acute uncomplicated diverticulitis involving the distal descending colon through the rectosigmoid junction. No perforation, fluid collection, or abscess. 2. Increasing linear sclerosis within the central aspect of the L1 vertebral body, worrisome for developing nondisplaced insufficiency fracture. If patient has symptoms localizing to this region, follow-up nonemergent MRI or bone scan could be considered. 3. Stable hiatal hernia. 4.  Aortic Atherosclerosis (ICD10-I70.0). Electronically Signed   By: Randa Ngo M.D.   On: 04/13/2021 17:43   DG Chest 2 View  Result Date: 04/13/2021 CLINICAL DATA:  Nausea EXAM: CHEST - 2 VIEW COMPARISON:  03/15/2021, CT 03/17/2021, radiograph 04/24/2020, 07/19/2020 FINDINGS: Left-sided central venous catheter tip over the SVC. No focal consolidation, pleural effusion or pneumothorax. Stable cardiomediastinal silhouette with large hiatal hernia contributing to right basilar density. No pneumothorax. IMPRESSION: No active cardiopulmonary disease.  Large hiatal hernia Electronically Signed   By: Donavan Foil M.D.   On: 04/13/2021 17:26   CT ABDOMEN PELVIS W CONTRAST  Result Date: 04/18/2021 CLINICAL DATA:  Rule out complicated diverticulitis. EXAM: CT ABDOMEN AND PELVIS WITH CONTRAST TECHNIQUE: Multidetector CT imaging of the abdomen and pelvis was performed using  the standard protocol following bolus administration of intravenous contrast. CONTRAST:  51mL OMNIPAQUE IOHEXOL 350 MG/ML SOLN COMPARISON:  Five days ago FINDINGS: Lower chest:  Large hiatal hernia.  Atelectasis at the lung bases. Hepatobiliary: No focal liver abnormality.Cholecystectomy. No bile duct dilatation. Pancreas: Generalized atrophy. Spleen: Unremarkable. Adrenals/Urinary Tract: Negative  adrenals. No hydronephrosis or stone. Symmetric renal atrophy. Largely collapsed urinary bladder with stable appearance Stomach/Bowel: Improved, nearly completely resolved diverticulitis. No perforation or abscess. Colonic diverticulosis is extensive. Large sliding hiatal hernia with intrathoracic stomach. Vascular/Lymphatic: No acute vascular abnormality. Extensive atheromatous calcification. No mass or adenopathy. Reproductive:Hysterectomy Other: No ascites or pneumoperitoneum. Musculoskeletal: L1 compression fracture with nonacute appearance and mild height loss. L4-5 PLIF with solid arthrodesis. IMPRESSION: 1. Essentially resolved sigmoid diverticulitis. 2. Interval atelectasis at the bases. 3. Large hiatal hernia. 4.  Aortic Atherosclerosis (ICD10-I70.0). Electronically Signed   By: Jorje Guild M.D.   On: 04/18/2021 06:19   VAS US DUPLEX DIALYSIS ACCESS (AVF,AVG)  Result Date: 04/02/2021 DIALYSIS ACCESS Patient Name:  SAFINA HUARD  Date of Exam:   04/02/2021 Medical Rec #: [id number]           Accession #:    [id number] Date of Birth: [birth year]           Patient Gender: F Patient Age:   79 years Exam Location:  [facility] Procedure:      VAS US DUPLEX DIALYSIS ACCESS (AVF, AVG) Referring Phys: Jamelle Haring --------------------------------------------------------------------------------  Reason for Exam: Routine follow up. Access Site: Right Upper Extremity. 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 Performing Technologist: Ronal Fear RVS, RCS  Examination Guidelines: A complete evaluation includes B-mode imaging, spectral Doppler, color Doppler, and power Doppler as needed of all accessible portions of each vessel. Unilateral testing is considered an integral part of a complete examination. Limited examinations for reoccurring indications may be performed as noted.  Findings: +--------------------+----------+-----------------+--------+ AVF                 PSV (cm/s)Flow Vol  (mL/min)Comments +--------------------+----------+-----------------+--------+ Native artery inflow   112           91                 +--------------------+----------+-----------------+--------+ AVF Anastomosis        232                              +--------------------+----------+-----------------+--------+  +------------+----------+-------------+----------+-----------------------------+ OUTFLOW VEINPSV (cm/s)Diameter (cm)Depth (cm)          Describe            +------------+----------+-------------+----------+-----------------------------+ Prox UA        128        0.62        1.80                                 +------------+----------+-------------+----------+-----------------------------+ Mid UA          96        0.42        1.06                                 +------------+----------+-------------+----------+-----------------------------+ Dist UA         57        0.46        0.33                                 +------------+----------+-------------+----------+-----------------------------+  AC Fossa       596        0.31        0.78      change in Diameter and                                                       partially-occlusive      +------------+----------+-------------+----------+-----------------------------+   Summary: Arteriovenous fistula-Elevated velocities and narrowed vessel lumen observed involving the first 2.5 cm of the basilic outflow vein from the anastomosis. Diminished flow volume. *See table(s) above for measurements and observations.  Diagnosing physician: Harold Barban MD Electronically signed by Harold Barban MD on 04/02/2021 at 7:45:05 PM.    --------------------------------------------------------------------------------   Final    VAS [place] UPPER EXTREMITY VENOUS DUPLEX  Result Date: 04/20/2021 UPPER VENOUS STUDY  Patient Name:  LOUISA FAVARO  Date of Exam:   04/20/2021 Medical Rec #: [id number]           Accession #:     [id number] Date of Birth: 1946/01/04           Patient Gender: F Patient Age:   80 years Exam Location:  [facility] Procedure:      VAS [place] UPPER EXTREMITY VENOUS DUPLEX Referring Phys: Nita Sells --------------------------------------------------------------------------------  Indications: Pain, Swelling, and Edema Comparison Study: no prior Performing Technologist: Archie Patten RVS  Examination Guidelines: A complete evaluation includes B-mode imaging, spectral Doppler, color Doppler, and power Doppler as needed of all accessible portions of each vessel. Bilateral testing is considered an integral part of a complete examination. Limited examinations for reoccurring indications may be performed as noted.  Right Findings: +----------+------------+---------+-----------+----------+-------+ RIGHT     CompressiblePhasicitySpontaneousPropertiesSummary +----------+------------+---------+-----------+----------+-------+ Subclavian    Full       Yes       Yes                      +----------+------------+---------+-----------+----------+-------+  Left Findings: +----------+------------+---------+-----------+----------+-------+ LEFT      CompressiblePhasicitySpontaneousPropertiesSummary +----------+------------+---------+-----------+----------+-------+ IJV           Full       Yes       Yes                      +----------+------------+---------+-----------+----------+-------+ Subclavian    Full       Yes       Yes                      +----------+------------+---------+-----------+----------+-------+ Axillary      Full       Yes       Yes                      +----------+------------+---------+-----------+----------+-------+ Brachial      Full       Yes       Yes                      +----------+------------+---------+-----------+----------+-------+ Radial        Full                                           +----------+------------+---------+-----------+----------+-------+  Ulnar         Full                                          +----------+------------+---------+-----------+----------+-------+ Cephalic      Full                                          +----------+------------+---------+-----------+----------+-------+ Basilic       Full                                          +----------+------------+---------+-----------+----------+-------+  Summary:  Right: No evidence of deep vein thrombosis in the upper extremity.  Left: No evidence of deep vein thrombosis in the upper extremity. No evidence of superficial vein thrombosis in the upper extremity.  *See table(s) above for measurements and observations.    Preliminary     Microbiology: Recent Results (from the past 240 hour(s))  Resp Panel by RT-PCR (Flu A&B, Covid) Nasopharyngeal Swab     Status: None   Collection Time: 04/13/21  5:38 PM   Specimen: Nasopharyngeal Swab; Nasopharyngeal(NP) swabs in vial transport medium  Result Value Ref Range Status   SARS Coronavirus 2 by RT PCR NEGATIVE NEGATIVE Final    Comment: (NOTE) SARS-CoV-2 target nucleic acids are NOT DETECTED.  The SARS-CoV-2 RNA is generally detectable in upper respiratory specimens during the acute phase of infection. The lowest concentration of SARS-CoV-2 viral copies this assay can detect is 138 copies/mL. A negative result does not preclude SARS-Cov-2 infection and should not be used as the sole basis for treatment or other patient management decisions. A negative result may occur with  improper specimen collection/handling, submission of specimen other than nasopharyngeal swab, presence of viral mutation(s) within the areas targeted by this assay, and inadequate number of viral copies(<138 copies/mL). A negative result must be combined with clinical observations, patient history, and epidemiological information. The expected result is Negative.  Fact  Sheet for Patients:  EntrepreneurPulse.com.au  Fact Sheet for Healthcare Providers:  IncredibleEmployment.be  This test is no t yet approved or cleared by the [place] FDA and  has been authorized for detection and/or diagnosis of SARS-CoV-2 by FDA under an Emergency Use Authorization (EUA). This EUA will remain  in effect (meaning this test can be used) for the duration of the COVID-19 declaration under Section 564(b)(1) of the Act, 21 U.S.C.section 360bbb-3(b)(1), unless the authorization is terminated  or revoked sooner.       Influenza A by PCR NEGATIVE NEGATIVE Final   Influenza B by PCR NEGATIVE NEGATIVE Final    Comment: (NOTE) The Xpert Xpress SARS-CoV-2/FLU/RSV plus assay is intended as an aid in the diagnosis of influenza from Nasopharyngeal swab specimens and should not be used as a sole basis for treatment. Nasal washings and aspirates are unacceptable for Xpert Xpress SARS-CoV-2/FLU/RSV testing.  Fact Sheet for Patients: EntrepreneurPulse.com.au  Fact Sheet for Healthcare Providers: IncredibleEmployment.be  This test is not yet approved or cleared by the [place] FDA and has been authorized for detection and/or diagnosis of SARS-CoV-2 by FDA under an Emergency Use Authorization (EUA). This EUA will remain in effect (meaning this test can be used) for the  duration of the COVID-19 declaration under Section 564(b)(1) of the Act, 21 U.S.C. section 360bbb-3(b)(1), unless the authorization is terminated or revoked.  Performed at [facility], [place] [address] [place], [place] [address]   SARS CORONAVIRUS 2 (TAT 6-24 HRS) Nasopharyngeal Nasopharyngeal Swab     Status: None   Collection Time: 04/20/21  6:25 AM   Specimen: Nasopharyngeal Swab  Result Value Ref Range Status   SARS Coronavirus 2 NEGATIVE NEGATIVE Final    Comment: (NOTE) SARS-CoV-2 target nucleic acids are NOT  DETECTED.  The SARS-CoV-2 RNA is generally detectable in upper and lower respiratory specimens during the acute phase of infection. Negative results do not preclude SARS-CoV-2 infection, do not rule out co-infections with other pathogens, and should not be used as the sole basis for treatment or other patient management decisions. Negative results must be combined with clinical observations, patient history, and epidemiological information. The expected result is Negative.  Fact Sheet for Patients: SugarRoll.be  Fact Sheet for Healthcare Providers: https://www.woods-mathews.com/  This test is not yet approved or cleared by the [place] FDA and  has been authorized for detection and/or diagnosis of SARS-CoV-2 by FDA under an Emergency Use Authorization (EUA). This EUA will remain  in effect (meaning this test can be used) for the duration of the COVID-19 declaration under Se ction 564(b)(1) of the Act, 21 U.S.C. section 360bbb-3(b)(1), unless the authorization is terminated or revoked sooner.  Performed at [facility], [place] [address], [place], [place] [address]      Labs: Basic Metabolic Panel: Recent Labs  Lab 04/15/21 0242 04/17/21 0434 04/17/21 2019 04/18/21 0256 04/19/21 0308  NA 136 137 134* 136 135  K 4.3 4.5 3.5 3.5 3.9  CL 103 99 96* 97* 98  CO2 27 25 27 27 26   GLUCOSE 84 99 87 86 99  BUN 14 25* 8 8 12   CREATININE 4.89* 8.05* 3.37* 3.95* 5.40*  CALCIUM 7.7* 8.3* 7.4* 7.6* 8.1*  PHOS 3.0 4.0  --   --   --     Liver Function Tests: Recent Labs  Lab 04/15/21 0242 04/17/21 0434 04/17/21 2019 04/18/21 0256 04/19/21 0308  AST  --   --  12* 8* 11*  ALT  --   --  7 7 8   ALKPHOS  --   --  65 63 68  BILITOT  --   --  1.0 1.0 0.7  PROT  --   --  5.3* 5.0* 5.0*  ALBUMIN 1.5* 1.7* 2.1* 2.2* 2.1*    No results for input(s): LIPASE, AMYLASE in the last 168 hours.  No results for input(s): AMMONIA in the  last 168 hours. CBC: Recent Labs  Lab 04/17/21 0434 04/17/21 2019 04/18/21 0256 04/19/21 0308 04/21/21 0810  WBC 15.5* 19.4* 18.2* 14.3* 12.6*  NEUTROABS 12.5* 16.5* 15.5* 11.6* 10.8*  HGB 9.4* 8.6* 8.0* 8.3* 8.4*  HCT 29.9* 26.1* 25.1* 25.6* 26.7*  MCV 91.2 90.3 91.9 91.1 92.7  PLT 291 281 250 306 324    Cardiac Enzymes: No results for input(s): CKTOTAL, CKMB, CKMBINDEX, TROPONINI in the last 168 hours. BNP: BNP (last 3 results) No results for input(s): BNP in the last 8760 hours.  ProBNP (last 3 results) No results for input(s): PROBNP in the last 8760 hours.  CBG: Recent Labs  Lab 04/19/21 1639  GLUCAP 101*        Signed:  Nita Sells MD   [facility] 04/21/2021, 8:50 AM

## 2021-04-23 DIAGNOSIS — E039 Hypothyroidism, unspecified: Secondary | ICD-10-CM | POA: Insufficient documentation

## 2021-04-23 HISTORY — DX: Hypothyroidism, unspecified: E03.9

## 2021-05-07 ENCOUNTER — Emergency Department (HOSPITAL_COMMUNITY): Payer: Medicare Other

## 2021-05-07 ENCOUNTER — Other Ambulatory Visit: Payer: Self-pay

## 2021-05-07 ENCOUNTER — Observation Stay (HOSPITAL_COMMUNITY)
Admission: EM | Admit: 2021-05-07 | Discharge: 2021-05-08 | Disposition: A | Discharge status: Home / Self Care | Payer: Medicare Other | Attending: Internal Medicine | Admitting: Internal Medicine

## 2021-05-07 ENCOUNTER — Encounter (HOSPITAL_COMMUNITY): Payer: Self-pay | Admitting: Emergency Medicine

## 2021-05-07 DIAGNOSIS — Z79899 Other long term (current) drug therapy: Secondary | ICD-10-CM | POA: Insufficient documentation

## 2021-05-07 DIAGNOSIS — I2510 Atherosclerotic heart disease of native coronary artery without angina pectoris: Secondary | ICD-10-CM | POA: Diagnosis not present

## 2021-05-07 DIAGNOSIS — Z87891 Personal history of nicotine dependence: Secondary | ICD-10-CM | POA: Diagnosis not present

## 2021-05-07 DIAGNOSIS — I132 Hypertensive heart and chronic kidney disease with heart failure and with stage 5 chronic kidney disease, or end stage renal disease: Secondary | ICD-10-CM | POA: Insufficient documentation

## 2021-05-07 DIAGNOSIS — Z8543 Personal history of malignant neoplasm of ovary: Secondary | ICD-10-CM | POA: Insufficient documentation

## 2021-05-07 DIAGNOSIS — E039 Hypothyroidism, unspecified: Secondary | ICD-10-CM | POA: Insufficient documentation

## 2021-05-07 DIAGNOSIS — E1122 Type 2 diabetes mellitus with diabetic chronic kidney disease: Secondary | ICD-10-CM | POA: Diagnosis not present

## 2021-05-07 DIAGNOSIS — R600 Localized edema: Secondary | ICD-10-CM

## 2021-05-07 DIAGNOSIS — I5032 Chronic diastolic (congestive) heart failure: Secondary | ICD-10-CM | POA: Insufficient documentation

## 2021-05-07 DIAGNOSIS — I480 Paroxysmal atrial fibrillation: Secondary | ICD-10-CM | POA: Diagnosis not present

## 2021-05-07 DIAGNOSIS — D631 Anemia in chronic kidney disease: Secondary | ICD-10-CM | POA: Insufficient documentation

## 2021-05-07 DIAGNOSIS — R531 Weakness: Secondary | ICD-10-CM

## 2021-05-07 DIAGNOSIS — N186 End stage renal disease: Secondary | ICD-10-CM

## 2021-05-07 DIAGNOSIS — J449 Chronic obstructive pulmonary disease, unspecified: Secondary | ICD-10-CM | POA: Diagnosis not present

## 2021-05-07 DIAGNOSIS — R2232 Localized swelling, mass and lump, left upper limb: Principal | ICD-10-CM | POA: Insufficient documentation

## 2021-05-07 DIAGNOSIS — Z20822 Contact with and (suspected) exposure to covid-19: Secondary | ICD-10-CM | POA: Insufficient documentation

## 2021-05-07 DIAGNOSIS — Z992 Dependence on renal dialysis: Secondary | ICD-10-CM | POA: Diagnosis not present

## 2021-05-07 DIAGNOSIS — N189 Chronic kidney disease, unspecified: Secondary | ICD-10-CM | POA: Diagnosis present

## 2021-05-07 DIAGNOSIS — Z7901 Long term (current) use of anticoagulants: Secondary | ICD-10-CM | POA: Insufficient documentation

## 2021-05-07 DIAGNOSIS — M7989 Other specified soft tissue disorders: Secondary | ICD-10-CM

## 2021-05-07 DIAGNOSIS — Z8541 Personal history of malignant neoplasm of cervix uteri: Secondary | ICD-10-CM | POA: Insufficient documentation

## 2021-05-07 LAB — CBC WITH DIFFERENTIAL/PLATELET
Abs Immature Granulocytes: 0.56 10*3/uL — ABNORMAL HIGH (ref 0.00–0.07)
Basophils Absolute: 0.1 10*3/uL (ref 0.0–0.1)
Basophils Relative: 0 %
Eosinophils Absolute: 0 10*3/uL (ref 0.0–0.5)
Eosinophils Relative: 0 %
HCT: 29.6 % — ABNORMAL LOW (ref 36.0–46.0)
Hemoglobin: 9.6 g/dL — ABNORMAL LOW (ref 12.0–15.0)
Immature Granulocytes: 3 %
Lymphocytes Relative: 6 %
Lymphs Abs: 1.2 10*3/uL (ref 0.7–4.0)
MCH: 29.6 pg (ref 26.0–34.0)
MCHC: 32.4 g/dL (ref 30.0–36.0)
MCV: 91.4 fL (ref 80.0–100.0)
Monocytes Absolute: 1 10*3/uL (ref 0.1–1.0)
Monocytes Relative: 4 %
Neutro Abs: 19.2 10*3/uL — ABNORMAL HIGH (ref 1.7–7.7)
Neutrophils Relative %: 87 %
Platelets: 339 10*3/uL (ref 150–400)
RBC: 3.24 MIL/uL — ABNORMAL LOW (ref 3.87–5.11)
RDW: 17.5 % — ABNORMAL HIGH (ref 11.5–15.5)
WBC: 22.1 10*3/uL — ABNORMAL HIGH (ref 4.0–10.5)
nRBC: 0.6 % — ABNORMAL HIGH (ref 0.0–0.2)

## 2021-05-07 LAB — RESP PANEL BY RT-PCR (FLU A&B, COVID) ARPGX2
Influenza A by PCR: NEGATIVE
Influenza B by PCR: NEGATIVE
SARS Coronavirus 2 by RT PCR: NEGATIVE

## 2021-05-07 LAB — BASIC METABOLIC PANEL
Anion gap: 13 (ref 5–15)
BUN: 48 mg/dL — ABNORMAL HIGH (ref 8–23)
CO2: 25 mmol/L (ref 22–32)
Calcium: 8.9 mg/dL (ref 8.9–10.3)
Chloride: 95 mmol/L — ABNORMAL LOW (ref 98–111)
Creatinine, Ser: 8.02 mg/dL — ABNORMAL HIGH (ref 0.44–1.00)
GFR, Estimated: 5 mL/min — ABNORMAL LOW (ref >60)
Glucose, Bld: 133 mg/dL — ABNORMAL HIGH (ref 70–99)
Potassium: 3.6 mmol/L (ref 3.5–5.1)
Sodium: 133 mmol/L — ABNORMAL LOW (ref 135–145)

## 2021-05-07 MED ORDER — ONDANSETRON HCL 4 MG/2ML IJ SOLN
4.0000 mg | Freq: Once | INTRAMUSCULAR | Status: AC
  Administered 2021-05-07: 4 mg via INTRAVENOUS
  Filled 2021-05-07: qty 2

## 2021-05-07 MED ORDER — FENTANYL CITRATE PF 50 MCG/ML IJ SOSY
50.0000 ug | PREFILLED_SYRINGE | Freq: Once | INTRAMUSCULAR | Status: AC
  Administered 2021-05-07: 50 ug via INTRAVENOUS
  Filled 2021-05-07: qty 1

## 2021-05-07 MED ORDER — SODIUM CHLORIDE 0.9 % IV SOLN: INTRAVENOUS | Status: DC

## 2021-05-07 MED ORDER — IOHEXOL 350 MG/ML SOLN
100.0000 mL | Freq: Once | INTRAVENOUS | Status: AC | PRN
  Administered 2021-05-07: 100 mL via INTRAVENOUS

## 2021-05-07 NOTE — ED Triage Notes (Signed)
Pt to ER via EMS from [facility].  Pt with reported left arm swelling form "a while".  Pt with noted significant swelling to left arm from mid humerus to finger tips.  EMS reports pt missed Saturday dialysis because she felt too bad and has continued to decline since that time.

## 2021-05-07 NOTE — ED Provider Notes (Signed)
[facility] EMERGENCY DEPARTMENT Provider Note   CSN: [id number] Arrival date & time: 05/07/21  1543     History Chief Complaint  Patient presents with   Arm Swelling    [name] is a 75 y.o. female.  HPI Patient is presenting for evaluation of left arm swelling, by EMS from her nursing care facility.  She states the swelling has been present for 1 or 2 weeks.  She complains of pain in her left arm.  She did not go to dialysis, today or the prior treatment, because she was feeling bad.  She denies fever, vomiting, shortness of breath.  She has been able to eat.  There are no other known active modifying factors.  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  It was recommended that she have a fistulogram however the patient decided not to do it at that time, 04/02/2021.    Past Medical History:  Diagnosis Date   Blood transfusion without reported diagnosis    CAD (coronary artery disease)    Nonobstructive at cardiac catheterization 2000   Cataract    Cervical cancer ([place]) 1978   CHF (congestive heart failure) (HCC)    Chronic back pain    Chronic kidney disease    Class 2 obesity with body mass index (BMI) of 35 to 39.9 without comorbidity    Complication of anesthesia    hard to awaken with one back surgery - years ago, no problem since   COPD (chronic obstructive pulmonary disease) ([place])    Degenerative disc disease    DM (diabetes mellitus), type 2 with renal complications ([place])    Dyslipidemia    Dyspnea    "due to COPD"   Dysrhythmia    a-fib   ESRD (end stage renal disease) on dialysis ([place])    TTHS- [address]   GERD (gastroesophageal reflux disease)    Gout    Hiatal hernia 07/27/2013   History of diverticulitis of colon    History of hiatal hernia    HTN (hypertension)    09/09/20 not currently on medication, is on medication for  hypotension   Hypotension    Iron deficiency anemia    Irritable bowel syndrome    Lumbar radiculopathy    Mixed hyperlipidemia    Moderate major depression, single episode ([place]) 07/21/2019   Neuropathy    OSA (obstructive sleep apnea)    Osteoporosis    Ovarian cancer ([place]) 1978   patient denies. States this was cervical cancer   Oxygen deficiency    room air   PAF (paroxysmal atrial fibrillation) (HCC)    Pneumonia    Sleep apnea    bipap - oxygen 2 l to bipap.   Type 2 diabetes mellitus ([place])    Vitamin B deficiency 12/25/2009   Vitamin B12 deficiency     Patient Active Problem List   Diagnosis Date Noted   Diverticulitis    Non-intractable vomiting    GI bleed 02/25/2021   Acute blood loss anemia 02/25/2021   Hypotension 02/25/2021   Anticoagulated    Presence of cardiac and vascular implant and graft, unspecified 11/14/2020   Diverticulitis of colon without hemorrhage 09/08/2020   Other disorders of phosphorus metabolism 08/31/2020   Acute respiratory failure due to COVID-19 [facility]) 07/20/2020   Acute respiratory failure with hypoxia ([place]) 07/20/2020   Allergy, unspecified, sequela 06/28/2020   Personal  history of anaphylaxis 06/28/2020   Recurrent falls 04/25/2020   Fall 04/24/2020   ESRD (end stage renal disease) on dialysis [facility])    Essential hypertension    Dyslipidemia    DM (diabetes mellitus), type 2 with renal complications (HCC)    Class 2 obesity with body mass index (BMI) of 35 to 39.9 without comorbidity    OSA (obstructive sleep apnea)    COPD (chronic obstructive pulmonary disease) (HCC)    PAF (paroxysmal atrial fibrillation) ([place])    Acute respiratory distress 03/09/2020   Fluid overload, unspecified 03/09/2020   Hyperkalemia 03/06/2020   GERD with esophagitis 03/06/2020   Acute bacterial bronchitis 03/06/2020   Anemia in chronic kidney disease 11/17/2019   Coagulation defect, unspecified ([place]) 11/17/2019   Pain, unspecified 11/17/2019    Pruritus, unspecified 11/17/2019   Secondary hyperparathyroidism of renal origin ([place]) 11/17/2019   Unspecified protein-calorie malnutrition ([place]) 11/17/2019   Loss of weight 10/22/2019   Goals of care, counseling/discussion 07/23/2019   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 07/21/2019   Paroxysmal atrial fibrillation ([place]) 07/21/2019   Encephalopathy 07/21/2019   Moderate major depression, single episode ([place]) 07/21/2019   Pressure ulcer 07/20/2019   Anorexia 07/10/2019   Weight gain with edema 07/02/2019   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 ([place]) 06/22/2019   Neuropathy due to type 2 diabetes mellitus ([place]) 06/22/2019   Mixed diabetic hyperlipidemia associated with type 2 diabetes mellitus ([place]) 06/22/2019   Chronic gout 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 06/22/2019   Quality of life palliative care encounter 06/19/2019   Atrial fibrillation with RVR (HCC)    Atrial flutter with rapid ventricular response ([place]) 06/16/2019   Dyspnea and respiratory abnormalities 06/01/2019   Acute on chronic respiratory failure with hypoxia and hypercapnia with respiratory acidosis 06/01/2019   Anasarca 05/31/2019   Nausea without vomiting 05/31/2019   Generalized abdominal pain 05/31/2019   History of colonic polyps 05/31/2019   Lymphedema of both lower extremities 04/28/2019   Cellulitis of finger of right hand 08/19/2018   Chronic respiratory failure with hypoxia ([place]) 07/13/2018   Acute renal failure superimposed on stage 3 chronic kidney disease ([place]) 07/13/2018   Chronic diastolic CHF (congestive heart failure) ([place]) 07/13/2018   COPD (chronic obstructive pulmonary disease) ([place]) 07/13/2018   Flatulence 09/18/2017   IBS (irritable bowel syndrome) 04/03/2017   Acute on chronic diastolic CHF (congestive heart failure) ([place]) 05/08/2016   COPD with acute exacerbation ([place]) 05/08/2016   Constipation  04/25/2016   Peripheral vertigo 06/27/2015   Intractable nausea and vomiting 03/29/2015   Port-A-Cath in place 10/06/2014   Chronic obstructive pulmonary disease ([place]) 04/30/2014   Obesity, Class III, BMI 40-49.9 (morbid obesity) ([place]) 04/30/2014   Hemorrhoids, internal, with bleeding 11/10/2013   Symptomatic anemia 08/18/2013   Other diseases of tongue 08/18/2013   Dark stools 07/27/2013   Chest pain 09/05/2012   Arthropathy 12/09/2011   Edema 12/09/2011   Gout 12/09/2011   Hiatal hernia with GERD and esophagitis    Obstructive chronic bronchitis without exacerbation ([place]) 12/26/2009   Hematochezia 12/26/2009   Dysphagia 12/26/2009   DM type 2 with diabetic peripheral neuropathy ([place]) 12/25/2009   Vitamin B deficiency 12/25/2009   Iron deficiency anemia due to chronic blood loss 12/25/2009   Benign essential HTN 12/25/2009   DEGENERATIVE DISC DISEASE 12/25/2009   OSA (obstructive sleep apnea) 12/25/2009   HLD (hyperlipidemia) 12/25/2009   Type 2 diabetes mellitus with ESRD (end-stage  renal disease) ([place]) 12/25/2009   Narrowing of intervertebral disc space 12/25/2009    Past Surgical History:  Procedure Laterality Date   ABDOMINAL HYSTERECTOMY     APPLICATION OF WOUND VAC Right 01/15/2021   Procedure: APPLICATION OF WOUND VAC;  Surgeon: Cherre Robins, MD;  Location: [facility];  Service: Vascular;  Laterality: Right;   AV FISTULA PLACEMENT Left 08/05/2019   Procedure: ARTERIOVENOUS (AV) FISTULA CREATION LEFT ARM;  Surgeon: Waynetta Sandy, MD;  Location: [facility];  Service: Vascular;  Laterality: Left;   AV FISTULA PLACEMENT Left 09/11/2020   Procedure: INSERTION OF ARTERIOVENOUS (AV) GORE-TEX GRAFT ARM LEFT;  Surgeon: Cherre Robins, MD;  Location: [facility];  Service: Vascular;  Laterality: Left;   AV FISTULA PLACEMENT Right 11/29/2020   Procedure: Creation of Arteriovenous Fistula Right Upper Arm;  Surgeon: Rosetta Posner, MD;  Location: [place];  Service: Vascular;  Laterality:  Right;   [place] Left 10/05/2019   Procedure: SECOND STAGE LEFT BASCILIC VEIN TRANSPOSITION;  Surgeon: Waynetta Sandy, MD;  Location: [place];  Service: Vascular;  Laterality: Left;   [place] Right 01/15/2021   Procedure: RIGHT UPPER ARM SECOND STAGE [place];  Surgeon: Cherre Robins, MD;  Location: [facility];  Service: Vascular;  Laterality: Right;  PERIPHERAL NERVE BLOCK   Benign breast cysts     BIOPSY  02/26/2021   Procedure: BIOPSY;  Surgeon: Jackquline Denmark, MD;  Location: [facility] ENDOSCOPY;  Service: Endoscopy;;   CHOLECYSTECTOMY     COLONOSCOPY  10/01/2006   SLF:Pan colonic diverticulosis and moderate internal hemorrhoids/ Otherwise no polyps, masses, inflammatory changes or AVMs/   COLONOSCOPY  2011   SLF: pancolonic diverticulosis, large internal hemorrhoids   COLONOSCOPY N/A 01/26/2016   Procedure: COLONOSCOPY;  Surgeon: [name], MD;  [facility];  Service: Endoscopy;  Laterality: N/A;  830    COLONOSCOPY WITH PROPOFOL N/A 02/10/2020   Procedure: COLONOSCOPY WITH PROPOFOL;  Surgeon: Daneil Dolin, MD;  [facility];  Service: Endoscopy;  Laterality: N/A;  10:45am   ESOPHAGEAL DILATION  02/26/2021   Procedure: ESOPHAGEAL DILATION;  Surgeon: Jackquline Denmark, MD;  Location: [facility] ENDOSCOPY;  Service: Endoscopy;;   ESOPHAGOGASTRODUODENOSCOPY  11/19/2006   SLF: Large hiatal hernia without evidence of Cameron ulcers/. Distal esophageal stricture, which allowed the gastroscope to pass without resistance.  A 16 mm Savary later passed with mild resistance/ Normal stomach.sb bx negative   ESOPHAGOGASTRODUODENOSCOPY  10/01/2006   GLO:VFIEP hiatal hernia.  Distal esophagus without evidence of   erythema, ulceration or Barrett's esophagus   ESOPHAGOGASTRODUODENOSCOPY  2011   SLF: large hh, distal esophageal web narrowing to 29mm s/p dilation to 82mm   ESOPHAGOGASTRODUODENOSCOPY N/A 08/06/2013    SLF: 1. Stricture at the gastroesophageal junction 2. large hiatal hernia. 3. Mild erosive gastritis.   ESOPHAGOGASTRODUODENOSCOPY (EGD) WITH PROPOFOL N/A 07/19/2019   rourk: Mild erosive reflux esophagitis.  Mild Schatzki ring status post dilation.  Large hiatal hernia with at least one half of the stomach above the diaphragm.  Gastric mucosa erythematous.   ESOPHAGOGASTRODUODENOSCOPY (EGD) WITH PROPOFOL N/A 02/26/2021   Procedure: ESOPHAGOGASTRODUODENOSCOPY (EGD) WITH PROPOFOL;  Surgeon: Jackquline Denmark, MD;  Location: [facility] ENDOSCOPY;  Service: Endoscopy;  Laterality: N/A;   GIVENS CAPSULE STUDY N/A 08/06/2013   INCOMPLETE-SMALL BOWLE ULCERS   IR FLUORO GUIDE CV LINE LEFT  07/29/2019   IR US GUIDE VASC ACCESS LEFT  07/29/2019   KNEE  SURGERY Right    PARTIAL HYSTERECTOMY  1978   PORT-A-CATH REMOVAL Right 11/29/2020   Procedure: REMOVAL OF RIGHT CHEST PORT;  Surgeon: Rosetta Posner, MD;  Location: [facility];  Service: Vascular;  Laterality: Right;   small bowel capsule  2008   negative   SPINE SURGERY     TONSILLECTOMY AND ADENOIDECTOMY     Two back surgeries/fusion     UMBILICAL HERNIA REPAIR  2010   UPPER EXTREMITY VENOGRAPHY N/A 11/22/2020   Procedure: UPPER EXTREMITY VENOGRAPHY;  Surgeon: Cherre Robins, MD;  Location: [facility] CV LAB;  Service: Cardiovascular;  Laterality: N/A;     OB History   No obstetric history on file.     Family History  Problem Relation Age of Onset   Colon cancer Brother        diagnosed age 11. Living.    Ulcers Sister    Diabetes Sister    Heart attack Sister    Kidney failure Sister    Stroke Sister    Ulcers Mother    Diabetes Mother    Heart attack Mother    Stroke Mother    Asthma Mother    Heart disease Mother    Cervical cancer Mother    Heart attack Brother    Heart disease Brother    Asthma Sister    Diabetes Brother    Stroke Maternal Grandmother    Heart attack Maternal Grandmother    Heart attack Other    Early death Father         MVA in his 35s    Social History   Tobacco Use   Smoking status: Former    Packs/day: 1.00    Years: 1.00    Pack years: 1.00    Types: Cigarettes    Start date: 02/19/1961    Quit date: 08/05/1961    Years since quitting: 59.7   Smokeless tobacco: Never   Tobacco comments:    1 year in her lifetime  Vaping Use   Vaping Use: Never used  Substance Use Topics   Alcohol use: Never   Drug use: Never    Home Medications Prior to Admission medications   Medication Sig Start Date End Date Taking? Authorizing Provider  Methoxy PEG-Epoetin Beta (MIRCERA IJ) Mircera 05/01/21 04/30/22 Yes [provider]  Tuberculin PPD (TUBERSOL ID) Inject into the skin. 04/24/21  Yes [provider]  acetaminophen (TYLENOL) 325 MG tablet Take 650 mg by mouth daily as needed for headache (pain).    [provider]  allopurinol (ZYLOPRIM) 100 MG tablet Take 1 tablet (100 mg total) by mouth at bedtime. 03/18/21   Aline August, MD  amiodarone (PACERONE) 200 MG tablet Take 1 tablet (200 mg total) by mouth in the morning. 01/15/21   Dagoberto Ligas, PA-C  apixaban (ELIQUIS) 2.5 MG TABS tablet Take 2.5 mg by mouth 2 (two) times daily.    [provider]  atorvastatin (LIPITOR) 40 MG tablet Take 40 mg by mouth daily in the afternoon. In the afternoon Patient not taking: Reported on 04/13/2021 01/15/21   Dagoberto Ligas, PA-C  Biotin 5000 MCG TABS Take 5,000 mcg by mouth in the morning.    [provider]  Camphor-Menthol-Methyl Sal (SALONPAS) 3.08-10-08 % PTCH Place 1 patch onto the skin daily as needed (pain).    [provider]  Carboxymethylcellul-Glycerin (CLEAR EYES FOR DRY EYES) 1-0.25 % SOLN Place 1 drop into both eyes daily as needed (dry eys).  [provider]  Cholecalciferol (VITAMIN D3) 50 MCG (2000 UT) TABS Take 2,000 Units by mouth in the morning.    [provider]  doxycycline (ADOXA) 100 MG tablet Take 1 tablet (100 mg total) by  mouth 2 (two) times daily. 04/20/21   Nita Sells, MD  esomeprazole (NEXIUM) 40 MG capsule Take 1 capsule (40 mg total) by mouth 2 (two) times daily before a meal. 04/20/21   Nita Sells, MD  ferric citrate (AURYXIA) 1 GM 210 MG(Fe) tablet Take 210 mg by mouth 3 (three) times daily with meals.    [provider]  Fluticasone-Umeclidin-Vilant (TRELEGY ELLIPTA) 100-62.5-25 MCG/INH AEPB Inhale 1 puff into the lungs daily at 12 noon.    [provider]  gabapentin (NEURONTIN) 100 MG capsule Take 100 mg by mouth at bedtime. 12/29/19   [provider]  iron sucrose in sodium chloride 0.9 % 100 mL Iron Sucrose (Venofer) 03/22/21 03/21/22  [provider]  midodrine (PROAMATINE) 10 MG tablet Take 1 tablet (10 mg total) by mouth 3 (three) times daily with meals. 04/20/21 05/20/21  Nita Sells, MD  nitroGLYCERIN (NITROSTAT) 0.4 MG SL tablet Place 0.4 mg under the tongue every 5 (five) minutes x 3 doses as needed for chest pain. 12/07/19   [provider]  OXYGEN Inhale 2 L into the lungs See admin instructions. Use every night and as needed during the day    [provider]  promethazine (PHENERGAN) 12.5 MG tablet Take 1 tablet (12.5 mg total) by mouth every 8 (eight) hours as needed for nausea or vomiting. 03/08/21 04/13/21  Eloise Harman, DO    Allergies    Patient has no known allergies.  Review of Systems   Review of Systems  All other systems reviewed and are negative.  Physical Exam Updated Vital Signs BP (!) 124/52   Pulse 83   Temp 98.2 F (36.8 C) (Oral)   Resp 18   Ht 4\' 8"  (1.422 m)   Wt 64 kg   LMP  (LMP Unknown)   SpO2 99%   BMI 31.63 kg/m   Physical Exam Vitals and nursing note reviewed.  Constitutional:      General: She is not in acute distress.    Appearance: She is well-developed. She is obese. She is not ill-appearing, toxic-appearing or diaphoretic.  HENT:     Head: Normocephalic and atraumatic.      Right Ear: External ear normal.     Left Ear: External ear normal.  Eyes:     Conjunctiva/sclera: Conjunctivae normal.     Pupils: Pupils are equal, round, and reactive to light.  Neck:     Trachea: Phonation normal.  Cardiovascular:     Rate and Rhythm: Normal rate and regular rhythm.     Heart sounds: Normal heart sounds.     Comments: Right upper arm, site of fistula appears normal. Pulmonary:     Effort: Pulmonary effort is normal.     Breath sounds: Normal breath sounds.  Abdominal:     Palpations: Abdomen is soft.     Tenderness: There is no abdominal tenderness.  Musculoskeletal:        General: Normal range of motion.     Cervical back: Normal range of motion and neck supple.     Comments: Marked swelling left arm with diffuse tenderness.  Skin color left arm is normal.  Left hand is warm and sensate.  She appears to have normal capillary refill in the left hand  fingers.  Skin:    General: Skin is warm and dry.  Neurological:     Mental Status: She is alert and oriented to person, place, and time.     Cranial Nerves: No cranial nerve deficit.     Sensory: No sensory deficit.     Motor: No abnormal muscle tone.     Coordination: Coordination normal.  Psychiatric:        Behavior: Behavior normal.        Thought Content: Thought content normal.        Judgment: Judgment normal.    ED Results / Procedures / Treatments   Labs (all labs ordered are listed, but only abnormal results are displayed) Labs Reviewed - No data to display  EKG None  Radiology No results found.  Procedures .Critical Care Performed by: Daleen Bo, MD Authorized by: Daleen Bo, MD   Critical care provider statement:    Critical care time (minutes):  35   Critical care start time:  05/07/2021 6:20 PM   Critical care end time:  05/08/2021 12:14 AM   Critical care time was exclusive of:  Separately billable procedures and treating other patients   Critical care was time spent  personally by me on the following activities:  Blood draw for specimens, development of treatment plan with patient or surrogate, discussions with consultants, evaluation of patient's response to treatment, examination of patient, obtaining history from patient or surrogate, ordering and performing treatments and interventions, ordering and review of laboratory studies, pulse oximetry, re-evaluation of patient's condition, review of old charts and ordering and review of radiographic studies   Medications Ordered in ED Medications - No data to display  ED Course  I have reviewed the triage vital signs and the nursing notes.  Pertinent labs & imaging results that were available during my care of the patient were reviewed by me and considered in my medical decision making (see chart for details).    MDM Rules/Calculators/A&P                            Patient Vitals for the past 24 hrs:  BP Temp Temp src Pulse Resp SpO2 Height Weight  05/07/21 1800 (!) 124/52 -- -- 83 18 99 % -- --  05/07/21 1730 (!) 121/49 -- -- 85 -- 100 % -- --  05/07/21 1700 (!) 119/51 -- -- 87 18 100 % -- --  05/07/21 1600 (!) 120/38 -- -- 89 18 94 % -- --  05/07/21 1557 -- -- -- -- -- -- 4\' 8"  (1.422 m) 64 kg  05/07/21 1555 (!) 133/44 98.2 F (36.8 C) Oral 89 18 94 % -- --    At the time of disposition- reevaluation with update and discussion. After initial assessment and treatment, an updated evaluation reveals she remains uncomfortable.  Findings discussed and questions answered. Daleen Bo   Medical Decision Making:  This patient is presenting for evaluation of left arm swelling, worsening with pain, which does require a range of treatment options, and is a complaint that involves a moderate risk of morbidity and mortality. The differential diagnoses include lymphedema, DVT, complication from central venous access, metabolic instability due to missed dialysis. I decided to review old records, and in summary  elderly female presenting with ongoing left arm swelling and pain who missed dialysis because of discomfort, several days ago..  I did not require additional historical information from anyone.  Clinical Laboratory Tests Ordered, included  CBC, Metabolic panel, and viral panel . Review indicates normal except sodium low, chloride low, glucose high, BUN high, creatinine high, GFR low, white count high, hemoglobin low. Radiologic Tests Ordered, included CT chest, venogram.  I independently Visualized: Radiographic images, which show pulmonary nodules, no infiltrate or significant edema, no evidence for central DVT per radiologist report  Cardiac Monitor Tracing which shows normal sinus rhythm    Critical Interventions-clinical evaluation, laboratory testing, radiography, discussion with radiologist, observation and reassessment  After These Interventions, the Patient was reevaluated and was found with significant left arm swelling and pain, unable to completely rule out DVT central.  Ultrasound not available for imaging of the left upper extremity.  CT, limited by access point in the right arm for injection, did not show central DVT.  Clinically the patient has edematous arm.  It does not appear to be from cellulitis.  Favor lymphedema, chronic, versus low suspicion for arthritic changes leading to misuse and edema.  Patient is uncomfortable and may need dialysis tomorrow.  Hospitalist requested to admit patient for Doppler imaging in the morning to further assess for central DVT.  CRITICAL CARE-yes Performed by: Daleen Bo  Nursing Notes Reviewed/ Care Coordinated Applicable Imaging Reviewed Interpretation of Laboratory Data incorporated into ED treatment   12:02 AM.  Case discussed with hospitalist will admit the patient for further management and likely ultrasound left arm in the morning to completely rule out DVT.  Final Clinical Impression(s) / ED Diagnoses Final diagnoses:  Left arm  swelling  End stage renal disease ([place])    Rx / DC Orders ED Discharge Orders     None        Daleen Bo, MD 05/08/21 [phone]

## 2021-05-08 ENCOUNTER — Observation Stay (HOSPITAL_COMMUNITY): Payer: Medicare Other

## 2021-05-08 DIAGNOSIS — R2232 Localized swelling, mass and lump, left upper limb: Secondary | ICD-10-CM | POA: Diagnosis not present

## 2021-05-08 DIAGNOSIS — R600 Localized edema: Secondary | ICD-10-CM

## 2021-05-08 DIAGNOSIS — R531 Weakness: Secondary | ICD-10-CM | POA: Diagnosis not present

## 2021-05-08 DIAGNOSIS — I480 Paroxysmal atrial fibrillation: Secondary | ICD-10-CM

## 2021-05-08 DIAGNOSIS — N186 End stage renal disease: Secondary | ICD-10-CM

## 2021-05-08 DIAGNOSIS — Z992 Dependence on renal dialysis: Secondary | ICD-10-CM

## 2021-05-08 DIAGNOSIS — D631 Anemia in chronic kidney disease: Secondary | ICD-10-CM

## 2021-05-08 LAB — CBC
HCT: 29.6 % — ABNORMAL LOW (ref 36.0–46.0)
Hemoglobin: 9.4 g/dL — ABNORMAL LOW (ref 12.0–15.0)
MCH: 29.3 pg (ref 26.0–34.0)
MCHC: 31.8 g/dL (ref 30.0–36.0)
MCV: 92.2 fL (ref 80.0–100.0)
Platelets: 303 10*3/uL (ref 150–400)
RBC: 3.21 MIL/uL — ABNORMAL LOW (ref 3.87–5.11)
RDW: 17.5 % — ABNORMAL HIGH (ref 11.5–15.5)
WBC: 19 10*3/uL — ABNORMAL HIGH (ref 4.0–10.5)
nRBC: 0.6 % — ABNORMAL HIGH (ref 0.0–0.2)

## 2021-05-08 LAB — COMPREHENSIVE METABOLIC PANEL
ALT: 11 U/L (ref 0–44)
AST: 14 U/L — ABNORMAL LOW (ref 15–41)
Albumin: 2.1 g/dL — ABNORMAL LOW (ref 3.5–5.0)
Alkaline Phosphatase: 109 U/L (ref 38–126)
Anion gap: 12 (ref 5–15)
BUN: 49 mg/dL — ABNORMAL HIGH (ref 8–23)
CO2: 24 mmol/L (ref 22–32)
Calcium: 8.7 mg/dL — ABNORMAL LOW (ref 8.9–10.3)
Chloride: 97 mmol/L — ABNORMAL LOW (ref 98–111)
Creatinine, Ser: 7.8 mg/dL — ABNORMAL HIGH (ref 0.44–1.00)
GFR, Estimated: 5 mL/min — ABNORMAL LOW (ref >60)
Glucose, Bld: 105 mg/dL — ABNORMAL HIGH (ref 70–99)
Potassium: 3.9 mmol/L (ref 3.5–5.1)
Sodium: 133 mmol/L — ABNORMAL LOW (ref 135–145)
Total Bilirubin: 0.8 mg/dL (ref 0.3–1.2)
Total Protein: 5.7 g/dL — ABNORMAL LOW (ref 6.5–8.1)

## 2021-05-08 LAB — MAGNESIUM: Magnesium: 1.9 mg/dL (ref 1.7–2.4)

## 2021-05-08 LAB — HEPATITIS B SURFACE ANTIGEN: Hepatitis B Surface Ag: NONREACTIVE

## 2021-05-08 MED ORDER — CHLORHEXIDINE GLUCONATE CLOTH 2 % EX PADS: 6.0000 | MEDICATED_PAD | Freq: Every day | CUTANEOUS | Status: DC

## 2021-05-08 MED ORDER — ACETAMINOPHEN 650 MG RE SUPP: 650.0000 mg | Freq: Four times a day (QID) | RECTAL | Status: DC | PRN

## 2021-05-08 MED ORDER — ACETAMINOPHEN 325 MG PO TABS
650.0000 mg | ORAL_TABLET | Freq: Four times a day (QID) | ORAL | Status: DC | PRN
  Administered 2021-05-08: 650 mg via ORAL
  Filled 2021-05-08: qty 2

## 2021-05-08 MED ORDER — SODIUM CHLORIDE 0.9 % IV SOLN: 100.0000 mL | INTRAVENOUS | Status: DC | PRN

## 2021-05-08 MED ORDER — ALTEPLASE 2 MG IJ SOLR: 2.0000 mg | Freq: Once | INTRAMUSCULAR | Status: DC | PRN

## 2021-05-08 MED ORDER — CLINDAMYCIN HCL 150 MG PO CAPS
300.0000 mg | ORAL_CAPSULE | Freq: Four times a day (QID) | ORAL | Status: DC
  Administered 2021-05-08 (×2): 300 mg via ORAL
  Filled 2021-05-08 (×2): qty 2

## 2021-05-08 MED ORDER — CEFTRIAXONE SODIUM 1 G IJ SOLR
1.0000 g | INTRAMUSCULAR | Status: DC
  Filled 2021-05-08: qty 10

## 2021-05-08 MED ORDER — ONDANSETRON HCL 4 MG PO TABS: 4.0000 mg | ORAL_TABLET | Freq: Four times a day (QID) | ORAL | Status: DC | PRN

## 2021-05-08 MED ORDER — TRAMADOL HCL 50 MG PO TABS: 50.0000 mg | ORAL_TABLET | Freq: Two times a day (BID) | ORAL | 0 refills | Status: AC | PRN

## 2021-05-08 MED ORDER — FLUTICASONE-UMECLIDIN-VILANT 100-62.5-25 MCG/INH IN AEPB: 1.0000 | INHALATION_SPRAY | Freq: Every day | RESPIRATORY_TRACT | Status: DC

## 2021-05-08 MED ORDER — HEPARIN SODIUM (PORCINE) 1000 UNIT/ML DIALYSIS: 1000.0000 [IU] | INTRAMUSCULAR | Status: DC | PRN

## 2021-05-08 MED ORDER — MIDODRINE HCL 5 MG PO TABS
10.0000 mg | ORAL_TABLET | Freq: Three times a day (TID) | ORAL | Status: DC
  Administered 2021-05-08 (×2): 10 mg via ORAL
  Filled 2021-05-08 (×2): qty 2

## 2021-05-08 MED ORDER — FLUTICASONE FUROATE-VILANTEROL 100-25 MCG/INH IN AEPB
1.0000 | INHALATION_SPRAY | Freq: Every day | RESPIRATORY_TRACT | Status: DC
  Administered 2021-05-08: 1 via RESPIRATORY_TRACT
  Filled 2021-05-08: qty 28

## 2021-05-08 MED ORDER — DICLOFENAC SODIUM 1 % EX GEL
2.0000 g | Freq: Three times a day (TID) | CUTANEOUS | Status: DC | PRN
  Filled 2021-05-08: qty 100

## 2021-05-08 MED ORDER — OXYCODONE HCL 5 MG PO TABS
5.0000 mg | ORAL_TABLET | ORAL | Status: DC | PRN
  Administered 2021-05-08 (×2): 5 mg via ORAL
  Filled 2021-05-08 (×2): qty 1

## 2021-05-08 MED ORDER — MORPHINE SULFATE (PF) 2 MG/ML IV SOLN
2.0000 mg | INTRAVENOUS | Status: DC | PRN
  Filled 2021-05-08: qty 1

## 2021-05-08 MED ORDER — APIXABAN 2.5 MG PO TABS
2.5000 mg | ORAL_TABLET | Freq: Two times a day (BID) | ORAL | Status: DC
  Administered 2021-05-08 (×2): 2.5 mg via ORAL
  Filled 2021-05-08 (×2): qty 1

## 2021-05-08 MED ORDER — GABAPENTIN 100 MG PO CAPS
100.0000 mg | ORAL_CAPSULE | Freq: Every day | ORAL | Status: DC
  Administered 2021-05-08: 100 mg via ORAL
  Filled 2021-05-08: qty 1

## 2021-05-08 MED ORDER — UMECLIDINIUM BROMIDE 62.5 MCG/INH IN AEPB
1.0000 | INHALATION_SPRAY | Freq: Every day | RESPIRATORY_TRACT | Status: DC
  Administered 2021-05-08: 1 via RESPIRATORY_TRACT
  Filled 2021-05-08: qty 7

## 2021-05-08 MED ORDER — AMIODARONE HCL 200 MG PO TABS
200.0000 mg | ORAL_TABLET | Freq: Every morning | ORAL | Status: DC
  Administered 2021-05-08: 200 mg via ORAL
  Filled 2021-05-08 (×2): qty 1

## 2021-05-08 MED ORDER — ONDANSETRON HCL 4 MG/2ML IJ SOLN
4.0000 mg | Freq: Four times a day (QID) | INTRAMUSCULAR | Status: DC | PRN
  Filled 2021-05-08: qty 2

## 2021-05-08 NOTE — Discharge Summary (Signed)
Physician Discharge Summary  [name] [name]:[id number] DOB: 19-Jun-1946 DOA: 05/07/2021  PCP: [name], MD  Admit date: 05/07/2021 Discharge date: 05/08/2021  Time spent: 30 minutes  Recommendations for Outpatient Follow-up:  Close monitoring hemoglobin with repeat CBC.  Discharge Diagnoses:  Active Problems:   ESRD (end stage renal disease) on dialysis (HCC)   PAF (paroxysmal atrial fibrillation) (HCC)   Anemia in chronic kidney disease   Edema of left upper arm   Generalized weakness Chronic diastolic HF  Discharge Condition: Stable and ready to go back to skilled nursing facility for further care and rehabilitation.  CODE STATUS: Full code.  Diet recommendation: heart healthy/low sodium diet   Filed Weights   05/07/21 1557  Weight: 64 kg    History of present illness:  As per H&P written by Dr. Clearence Ped on 05/08/21 [name]  is a 75 y.o. female, with history of ESRD on hemodialysis Tuesday Thursday Saturday, extensive diverticulosis, recent hospitalizations for diverticulitis and GI bleed started on a course of Augmentin, then nausea and vomiting and weakness, which she was diagnosed with recurrent diverticulitis/prior gastritis with hematemesis, large hiatal hernia at high risk of volvulus, and superficial phlebitis of the left upper extremity from IV with mention at that time.  She was advised to monitor the swelling, elevate her arm, and she was discharged on doxycycline for 3 days.  Ultrasound left upper extremity on 9/16 did not show any clots so was determined that was probably all phlebitis at that time.  She was advised to have reevaluation in the outpatient setting.  Patient presents today from the rehab facility.  She reports she did not want to come in and she is not sure why she is here.  She reports she has had generalized weakness, nausea, left upper arm pain.  Patient reports that she has had diarrhea, nausea/vomiting, that nothing is  controlled.  She reports that she has had generalized weakness secondary to those GI symptoms.  Patient reports that this is not occurred since her last admission.  Regarding her arm she reports swelling in her arm has gotten worse over the last 1 or 2 days.  She reports that it has been weeping at the facility.  She is not sure why they sent her today, she reports there is been no acute change.  She reports her left upper arm is a stabbing pain is constant, with intermittent burning and paresthesias in her fingertips.  She has weakness in the arm but it secondary to pain per her report.  Patient ambulates with a wheelchair but is not able to grip the wheelchair, so she does have assistance moving even with that.  She denies any shortness of breath, palpitations, or chest pain.  She does report a decrease in appetite that she attributes to the recent GI symptoms she has had with the other 2 admissions.  She does report abdominal pain is residual.  Last bowel movement was yesterday.  Patient reports she still makes small amounts of urine and has not had any dysuria.   Of this left upper extremity edema-patient does not report any axillary procedures or lymph node resections that she knows of in that area.  She does report that she had 3 cysts removed from her left breast decades ago.   Patient does not smoke, does not drink alcohol, does not use illicit drugs.  Patient is vaccinated for COVID.  Patient is full code.  Hospital Course:  1-edema and pain on left upper  extremity -Negative DVT -No frank aperients for cellulitis process -Patient afebrile and with normal WBCs -Recommended keeping extremity elevated, apply cold compresses to assist with swelling and proceed rehabilitation and/conditioning. -Third space shifting playing a role in the setting of decreased albumin. -Protein shakes/nepro 3 times a day recommended.  2-end-stage renal disease on hemodialysis -Hemodialysis will be provided  today -Resume outpatient hemodialysis treatment; Next treatment on 05/10/2021  3-atrial fibrillation -Continue amiodarone and Eliquis -Rate controlled and stable currently.  4-chronic diastolic heart failure -Continue to follow sodium diet and daily weights -Advised to maintain adequate hydration -Continue volume management by hemodialysis.  5-chronic anemia -No signs of overt bleeding -Continue to follow hemoglobin trend -IV iron and Epogen as per nephrology service discretion.  6-class I obesity -Body mass index is 31.63 kg/m. -Low calorie diet and portion control discussed with patient.  7-generalized weakness -Secondary to deconditioning and in the setting of decompensated chronic medical problems. -Patient discharged back to skilled nursing facility for further rehabilitation and physical therapy.   Procedures: Hemodialysis provided on 05/08/2021  Left upper extremity ultrasound: Negative for DVT  Consultations: Nephrology service  Discharge Exam: Vitals:   05/08/21 1430 05/08/21 1500  BP: (!) 122/96   Pulse:  75  Resp:    Temp:    SpO2:  99%    General: Chronically ill in appearance; reports no chest pain, no shortness of breath, no nausea or vomiting.  Patient expressing pain in her left upper extremity. Cardiovascular: Rate controlled, no rubs, no gallops, unable to assess JVD with body habitus. Respiratory: No using accessory muscles; chronic oxygen supplementation in place.  No crackles, no wheezing. Abdomen: Obese, no guarding, positive bowel sounds. Extremities: No cyanosis or clubbing; left upper extremity swollen, with decreased range of motion and tenderness on palpation.  Discharge Instructions   Discharge Instructions     (HEART FAILURE PATIENTS) Call MD:  Anytime you have any of the following symptoms: 1) 3 pound weight gain in 24 hours or 5 pounds in 1 week 2) shortness of breath, with or without a dry hacking cough 3) swelling in the hands,  feet or stomach 4) if you have to sleep on extra pillows at night in order to breathe.   Complete by: As directed    Diet - low sodium heart healthy   Complete by: As directed    Discharge instructions   Complete by: As directed    Take medications as prescribed Start the use of Nepro three times a day Continue outpatient follow up with nephrology service and resume HD as an outpatient.  Physical therapy, recovery and care as per SNF protocol      Allergies as of 05/08/2021   No Known Allergies      Medication List     STOP taking these medications    doxycycline 100 MG tablet Commonly known as: ADOXA       TAKE these medications    acetaminophen 325 MG tablet Commonly known as: TYLENOL Take 650 mg by mouth daily as needed for headache (pain).   allopurinol 100 MG tablet Commonly known as: ZYLOPRIM Take 1 tablet (100 mg total) by mouth at bedtime.   amiodarone 200 MG tablet Commonly known as: PACERONE Take 1 tablet (200 mg total) by mouth in the morning.   apixaban 2.5 MG Tabs tablet Commonly known as: ELIQUIS Take 2.5 mg by mouth 2 (two) times daily.   atorvastatin 40 MG tablet Commonly known as: LIPITOR Take 40 mg by mouth daily in  the afternoon.   Biotin 5000 MCG Tabs Take 5,000 mcg by mouth in the morning.   budesonide-formoterol 160-4.5 MCG/ACT inhaler Commonly known as: SYMBICORT Inhale 1 puff into the lungs daily.   Clear Eyes for Dry Eyes 1-0.25 % Soln Generic drug: Carboxymethylcellul-Glycerin Place 1 drop into both eyes daily as needed (dry eys).   esomeprazole 40 MG capsule Commonly known as: NEXIUM Take 1 capsule (40 mg total) by mouth 2 (two) times daily before a meal.   ferric citrate 1 GM 210 MG(Fe) tablet Commonly known as: AURYXIA Take 210 mg by mouth 3 (three) times daily with meals.   gabapentin 100 MG capsule Commonly known as: NEURONTIN Take 100 mg by mouth at bedtime.   iron sucrose in sodium chloride 0.9 % 100 mL Iron  Sucrose (Venofer)   midodrine 10 MG tablet Commonly known as: PROAMATINE Take 1 tablet (10 mg total) by mouth 3 (three) times daily with meals.   MIRCERA IJ Mircera   nitroGLYCERIN 0.4 MG SL tablet Commonly known as: NITROSTAT Place 0.4 mg under the tongue every 5 (five) minutes x 3 doses as needed for chest pain.   OXYGEN Inhale 2 L into the lungs See admin instructions. Use every night and as needed during the day   promethazine 12.5 MG tablet Commonly known as: PHENERGAN Take 1 tablet (12.5 mg total) by mouth every 8 (eight) hours as needed for nausea or vomiting.   Salonpas 3.08-10-08 % Ptch Generic drug: Camphor-Menthol-Methyl Sal Place 1 patch onto the skin daily as needed (pain).   traMADol 50 MG tablet Commonly known as: ULTRAM Take 1 tablet (50 mg total) by mouth every 12 (twelve) hours as needed for moderate pain.   TUBERSOL ID Inject into the skin.   Vitamin D3 50 MCG (2000 UT) Tabs Take 2,000 Units by mouth in the morning.       No Known Allergies  Follow-up Information     [name], MD. Schedule an appointment as soon as possible for a visit in 10 day(s).   Specialty: Family Medicine Contact information: [place] [address] [phone]         Satira Sark, MD .   Specialty: Cardiology Contact information: [place] [address] [phone]                The results of significant diagnostics from this hospitalization (including imaging, microbiology, ancillary and laboratory) are listed below for reference.    Significant Diagnostic Studies: CT ABDOMEN PELVIS WO CONTRAST  Result Date: 04/13/2021 CLINICAL DATA:  Acute nonlocalized abdominal pain EXAM: CT ABDOMEN AND PELVIS WITHOUT CONTRAST TECHNIQUE: Multidetector CT imaging of the abdomen and pelvis was performed following the standard protocol without IV contrast. Unenhanced CT was performed per clinician order. Lack of IV contrast  limits sensitivity and specificity, especially for evaluation of abdominal/pelvic solid viscera. COMPARISON:  03/17/2021 FINDINGS: Lower chest: No acute pleural or parenchymal lung disease. Large hiatal hernia is again identified unchanged. Hepatobiliary: Gallbladder is surgically absent. No gross liver abnormalities on this unenhanced exam. Pancreas: Unremarkable. No pancreatic ductal dilatation or surrounding inflammatory changes. Spleen: Normal in size without focal abnormality. Adrenals/Urinary Tract: Marked bilateral renal cortical atrophy compatible with end-stage renal disease. No urinary tract calculi or obstructive uropathy. The adrenals are unremarkable. Bladder is decompressed, limiting its evaluation. Stomach/Bowel: There is diffuse colonic diverticulosis, with with superimposed wall thickening involving the distal descending colon through the rectosigmoid junction, consistent with  acute uncomplicated diverticulitis. No bowel obstruction or ileus. Vascular/Lymphatic: Diffuse atherosclerosis throughout the aorta and its distal branches unchanged. No pathologic adenopathy within the abdomen or pelvis. Reproductive: Status post hysterectomy. No adnexal masses. Other: No free fluid or free gas.  No abdominal wall hernia. Musculoskeletal: There is increasing linear sclerosis within the central aspect of the L1 vertebral body, worrisome for developing insufficiency fracture. There is no significant loss of height or visible fracture line since prior study. Follow-up nonemergent MRI or bone scan could be considered if patient has clinical symptoms of lower back pain. Stable postsurgical changes at L4-5. No other acute bony abnormalities. Reconstructed images demonstrate no additional findings. IMPRESSION: 1. Acute uncomplicated diverticulitis involving the distal descending colon through the rectosigmoid junction. No perforation, fluid collection, or abscess. 2. Increasing linear sclerosis within the central  aspect of the L1 vertebral body, worrisome for developing nondisplaced insufficiency fracture. If patient has symptoms localizing to this region, follow-up nonemergent MRI or bone scan could be considered. 3. Stable hiatal hernia. 4.  Aortic Atherosclerosis (ICD10-I70.0). Electronically Signed   By: Randa Ngo M.D.   On: 04/13/2021 17:43   DG Chest 2 View  Result Date: 04/13/2021 CLINICAL DATA:  Nausea EXAM: CHEST - 2 VIEW COMPARISON:  03/15/2021, CT 03/17/2021, radiograph 04/24/2020, 07/19/2020 FINDINGS: Left-sided central venous catheter tip over the SVC. No focal consolidation, pleural effusion or pneumothorax. Stable cardiomediastinal silhouette with large hiatal hernia contributing to right basilar density. No pneumothorax. IMPRESSION: No active cardiopulmonary disease.  Large hiatal hernia Electronically Signed   By: Donavan Foil M.D.   On: 04/13/2021 17:26   CT CHEST W CONTRAST  Result Date: 05/07/2021 CLINICAL DATA:  Arm deep vein thrombosis suspected. Left arm swelling. EXAM: CT CHEST WITH CONTRAST TECHNIQUE: Multidetector CT imaging of the chest was performed during intravenous contrast administration. CONTRAST:  163mL OMNIPAQUE IOHEXOL 350 MG/ML SOLN COMPARISON:  CT chest 07/16/2006 FINDINGS: Cardiovascular: No acute vascular findings. Normal heart size. No pericardial effusion. Coronary artery calcifications are present. There are atherosclerotic calcifications of the aorta. Left-sided dual lumen catheter tip ends in the SVC. Mediastinum/Nodes: There are no enlarged mediastinal or hilar lymph nodes. The esophagus is nondilated. There is a large hiatal hernia with intrathoracic stomach. Hernia has increased when compared to the prior study. Visualized thyroid gland is within normal limits. Lungs/Pleura: There are 2 new pulmonary nodules measuring up to 4 mm in the right middle lobe images 4/102 and 103. Other scattered nodules measuring 4 mm or less appear similar to the prior study. There is  compressive atelectasis of the bilateral lower lobes. There may be some airspace disease with air bronchograms in the right lower lobe as well. There is no pleural effusion or pneumothorax. Trachea and central airways are patent. Upper Abdomen: Cholecystectomy clips are present. Musculoskeletal: There is soft tissue edema at the level of the left elbow. No enlarged lymph nodes are identified. IMPRESSION: 1. Bilateral lower lobe atelectasis. Additional small amount of airspace disease in the right lower lobe worrisome for pneumonia. 2. Large hiatal hernia with intrathoracic stomach which has increased in size. 3. Multiple new pulmonary nodules. Most severe: 4 mm right solid pulmonary nodule. No routine follow-up imaging is recommended per Fleischner Society Guidelines. These guidelines do not apply to immunocompromised patients and patients with cancer. Follow up in patients with significant comorbidities as clinically warranted. For lung cancer screening, adhere to Lung-RADS guidelines. Reference: Radiology. 2017; 284(1):228-43. 4. Left elbow soft tissue edema. 5.  Aortic Atherosclerosis (ICD10-I70.0).  At Electronically  Signed   By: Ronney Asters M.D.   On: 05/07/2021 22:26   CT ABDOMEN PELVIS W CONTRAST  Result Date: 04/18/2021 CLINICAL DATA:  Rule out complicated diverticulitis. EXAM: CT ABDOMEN AND PELVIS WITH CONTRAST TECHNIQUE: Multidetector CT imaging of the abdomen and pelvis was performed using the standard protocol following bolus administration of intravenous contrast. CONTRAST:  42mL OMNIPAQUE IOHEXOL 350 MG/ML SOLN COMPARISON:  Five days ago FINDINGS: Lower chest:  Large hiatal hernia.  Atelectasis at the lung bases. Hepatobiliary: No focal liver abnormality.Cholecystectomy. No bile duct dilatation. Pancreas: Generalized atrophy. Spleen: Unremarkable. Adrenals/Urinary Tract: Negative adrenals. No hydronephrosis or stone. Symmetric renal atrophy. Largely collapsed urinary bladder with stable  appearance Stomach/Bowel: Improved, nearly completely resolved diverticulitis. No perforation or abscess. Colonic diverticulosis is extensive. Large sliding hiatal hernia with intrathoracic stomach. Vascular/Lymphatic: No acute vascular abnormality. Extensive atheromatous calcification. No mass or adenopathy. Reproductive:Hysterectomy Other: No ascites or pneumoperitoneum. Musculoskeletal: L1 compression fracture with nonacute appearance and mild height loss. L4-5 PLIF with solid arthrodesis. IMPRESSION: 1. Essentially resolved sigmoid diverticulitis. 2. Interval atelectasis at the bases. 3. Large hiatal hernia. 4.  Aortic Atherosclerosis (ICD10-I70.0). Electronically Signed   By: Jorje Guild M.D.   On: 04/18/2021 06:19   US Venous Img Upper Uni Left (DVT)  Result Date: 05/08/2021 CLINICAL DATA:  Left upper extremity pain and edema for 2 days EXAM: LEFT UPPER EXTREMITY VENOUS DOPPLER ULTRASOUND TECHNIQUE: Gray-scale sonography with graded compression, as well as color Doppler and duplex ultrasound were performed to evaluate the upper extremity deep venous system from the level of the subclavian vein and including the jugular, axillary, basilic, radial, ulnar and upper cephalic vein. Spectral Doppler was utilized to evaluate flow at rest and with distal augmentation maneuvers. COMPARISON:  None. FINDINGS: Contralateral Subclavian Vein: Respiratory phasicity is normal and symmetric with the symptomatic side. No evidence of thrombus. Normal compressibility. Internal Jugular Vein: No evidence of thrombus. Normal compressibility, respiratory phasicity and response to augmentation. Subclavian Vein: No evidence of thrombus. Normal compressibility, respiratory phasicity and response to augmentation. Axillary Vein: No evidence of thrombus. Normal compressibility, respiratory phasicity and response to augmentation. Cephalic Vein: No evidence of thrombus. Normal compressibility, respiratory phasicity and response to  augmentation. Basilic Vein: No evidence of thrombus. Normal compressibility, respiratory phasicity and response to augmentation. Brachial Veins: No evidence of thrombus. Normal compressibility, respiratory phasicity and response to augmentation. Radial Veins: No evidence of thrombus. Normal compressibility, respiratory phasicity and response to augmentation. Ulnar Veins: No evidence of thrombus. Normal compressibility, respiratory phasicity and response to augmentation. Venous Reflux:  None visualized. Other Findings:  None visualized. IMPRESSION: No evidence of DVT within the left upper extremity. Electronically Signed   By: Miachel Roux M.D.   On: 05/08/2021 10:22   VAS [place] UPPER EXTREMITY VENOUS DUPLEX  Result Date: 04/20/2021 UPPER VENOUS STUDY  Patient Name:  CALE DECAROLIS  Date of Exam:   04/20/2021 Medical Rec #: [id number]           Accession #:    [id number] Date of Birth: 10/10/1945           Patient Gender: F Patient Age:   68 years Exam Location:  [facility] Procedure:      VAS [place] UPPER EXTREMITY VENOUS DUPLEX Referring Phys: Nita Sells --------------------------------------------------------------------------------  Indications: Pain, Swelling, and Edema Comparison Study: no prior Performing Technologist: Archie Patten RVS  Examination Guidelines: A complete evaluation includes B-mode imaging, spectral Doppler, color Doppler, and power Doppler as needed of all accessible portions  of each vessel. Bilateral testing is considered an integral part of a complete examination. Limited examinations for reoccurring indications may be performed as noted.  Right Findings: +----------+------------+---------+-----------+----------+-------+ RIGHT     CompressiblePhasicitySpontaneousPropertiesSummary +----------+------------+---------+-----------+----------+-------+ Subclavian    Full       Yes       Yes                       +----------+------------+---------+-----------+----------+-------+  Left Findings: +----------+------------+---------+-----------+----------+-------+ LEFT      CompressiblePhasicitySpontaneousPropertiesSummary +----------+------------+---------+-----------+----------+-------+ IJV           Full       Yes       Yes                      +----------+------------+---------+-----------+----------+-------+ Subclavian    Full       Yes       Yes                      +----------+------------+---------+-----------+----------+-------+ Axillary      Full       Yes       Yes                      +----------+------------+---------+-----------+----------+-------+ Brachial      Full       Yes       Yes                      +----------+------------+---------+-----------+----------+-------+ Radial        Full                                          +----------+------------+---------+-----------+----------+-------+ Ulnar         Full                                          +----------+------------+---------+-----------+----------+-------+ Cephalic      Full                                          +----------+------------+---------+-----------+----------+-------+ Basilic       Full                                          +----------+------------+---------+-----------+----------+-------+  Summary:  Right: No evidence of deep vein thrombosis in the upper extremity.  Left: No evidence of deep vein thrombosis in the upper extremity. No evidence of superficial vein thrombosis in the upper extremity.  *See table(s) above for measurements and observations.  Diagnosing physician: Orlie Pollen Electronically signed by Orlie Pollen on 04/20/2021 at 7:15:50 PM.    Final     Microbiology: Recent Results (from the past 240 hour(s))  Resp Panel by RT-PCR (Flu A&B, Covid) Nasopharyngeal Swab     Status: None   Collection Time: 05/07/21  7:11 PM   Specimen: Nasopharyngeal Swab;  Nasopharyngeal(NP) swabs in vial transport medium  Result Value Ref Range Status   SARS Coronavirus 2 by RT PCR NEGATIVE NEGATIVE Final    Comment: (NOTE) SARS-CoV-2  target nucleic acids are NOT DETECTED.  The SARS-CoV-2 RNA is generally detectable in upper respiratory specimens during the acute phase of infection. The lowest concentration of SARS-CoV-2 viral copies this assay can detect is 138 copies/mL. A negative result does not preclude SARS-Cov-2 infection and should not be used as the sole basis for treatment or other patient management decisions. A negative result may occur with  improper specimen collection/handling, submission of specimen other than nasopharyngeal swab, presence of viral mutation(s) within the areas targeted by this assay, and inadequate number of viral copies(<138 copies/mL). A negative result must be combined with clinical observations, patient history, and epidemiological information. The expected result is Negative.  Fact Sheet for Patients:  EntrepreneurPulse.com.au  Fact Sheet for Healthcare Providers:  IncredibleEmployment.be  This test is no t yet approved or cleared by the [place] FDA and  has been authorized for detection and/or diagnosis of SARS-CoV-2 by FDA under an Emergency Use Authorization (EUA). This EUA will remain  in effect (meaning this test can be used) for the duration of the COVID-19 declaration under Section 564(b)(1) of the Act, 21 U.S.C.section 360bbb-3(b)(1), unless the authorization is terminated  or revoked sooner.       Influenza A by PCR NEGATIVE NEGATIVE Final   Influenza B by PCR NEGATIVE NEGATIVE Final    Comment: (NOTE) The Xpert Xpress SARS-CoV-2/FLU/RSV plus assay is intended as an aid in the diagnosis of influenza from Nasopharyngeal swab specimens and should not be used as a sole basis for treatment. Nasal washings and aspirates are unacceptable for Xpert Xpress  SARS-CoV-2/FLU/RSV testing.  Fact Sheet for Patients: EntrepreneurPulse.com.au  Fact Sheet for Healthcare Providers: IncredibleEmployment.be  This test is not yet approved or cleared by the [place] FDA and has been authorized for detection and/or diagnosis of SARS-CoV-2 by FDA under an Emergency Use Authorization (EUA). This EUA will remain in effect (meaning this test can be used) for the duration of the COVID-19 declaration under Section 564(b)(1) of the Act, 21 U.S.C. section 360bbb-3(b)(1), unless the authorization is terminated or revoked.  Performed at [facility], [address] [place], [place] [address]      Labs: Basic Metabolic Panel: Recent Labs  Lab 05/07/21 2116 05/08/21 0442  NA 133* 133*  K 3.6 3.9  CL 95* 97*  CO2 25 24  GLUCOSE 133* 105*  BUN 48* 49*  CREATININE 8.02* 7.80*  CALCIUM 8.9 8.7*  MG  --  1.9   Liver Function Tests: Recent Labs  Lab 05/08/21 0442  AST 14*  ALT 11  ALKPHOS 109  BILITOT 0.8  PROT 5.7*  ALBUMIN 2.1*   CBC: Recent Labs  Lab 05/07/21 2116 05/08/21 0442  WBC 22.1* 19.0*  NEUTROABS 19.2*  --   HGB 9.6* 9.4*  HCT 29.6* 29.6*  MCV 91.4 92.2  PLT 339 303   Signed:  Barton Dubois MD.  [facility] 05/08/2021, 3:21 PM

## 2021-05-08 NOTE — Progress Notes (Signed)
I see that [name] is here under OBS status-  IV abx for arm issue.  She is TTS dialysis patient from [facility].  Her last OP treatment was Thursday 9/29.    Her orders TTS-  4 hours 180 opti CVC BFR 400/dfr 800 2 K/2.5 calc bath  no heparin Calcitriol 1.25 tiw, mircera 225 and iron 50  I will write orders for her routine HD today-  will do full consult if changes to inpatient status  Louis Meckel

## 2021-05-08 NOTE — TOC Transition Note (Signed)
Transition of Care [facility]) - CM/SW Discharge Note   Patient Details  Name: [name] MRN: [id number] Date of Birth: Jul 30, 1946  [facility]) CM/SW Contact:  Salome Arnt, [place] Phone Number: 05/08/2021, 3:41 PM   Clinical Narrative: Pt d/c today after dialysis back to [facility]. D/C summary sent to SNF. Facility can accept at anytime tonight. RN aware to call [facility] after dialysis for transport. RN given number to call report to SNF. Pt's son aware of d/c tonight.       Final next level of care: Skilled Nursing Facility Barriers to Discharge: Barriers Resolved   Patient Goals and CMS Choice Patient states their goals for this hospitalization and ongoing recovery are:: return to SNF   Choice offered to / list presented to : Adult Children  Discharge Placement              Patient chooses bed at: [facility] Patient to be transferred to facility by: [facility] Name of family member notified: [name], son Patient and family notified of of transfer: 05/08/21  Discharge Plan and Services In-house Referral: Clinical Social Work   Post Acute Care Choice: [place]          DME Arranged: N/A                    Social Determinants of Health (SDOH) Interventions     Readmission Risk Interventions Readmission Risk Prevention Plan 07/20/2020 04/28/2020 03/08/2020  Transportation Screening Complete Complete Complete  PCP or Specialist Appt within 3-5 Days - Not Complete -  Not Complete comments - going to SNF -  [place] or [place] - Complete -  [place] or Home Care Consult comments - - -  Social Work Consult for [place] Planning/Counseling - Complete -  Palliative Care Screening - Not Applicable -  Medication Review Press photographer) Referral to Pharmacy Complete Complete  PCP or Specialist appointment within 3-5 days of discharge Complete - Complete  PCP/Specialist Appt Not Complete comments - - -  [place]  or Home Care Consult Complete - Complete  SW Recovery Care/Counseling Consult Complete - Complete  Palliative Care Screening Not Applicable - Not [place] Not Applicable - Not Applicable  Some recent data might be hidden

## 2021-05-08 NOTE — H&P (Addendum)
TRH H&P    Patient Demographics:    [name], is a 75 y.o. female  MRN: [id number]  DOB - 09/21/1945  Admit Date - 05/07/2021  Referring MD/NP/PA: Eulis Foster  Outpatient Primary MD for the patient is [name], MD  Patient coming from: [facility]  Chief complaint- left arm swelling   HPI:    [name]  is a 75 y.o. female, with history of ESRD on hemodialysis Tuesday Thursday Saturday, extensive diverticulosis, recent hospitalizations for diverticulitis and GI bleed started on a course of Augmentin, then nausea and vomiting and weakness, which she was diagnosed with recurrent diverticulitis/prior gastritis with hematemesis, large hiatal hernia at high risk of volvulus, and superficial phlebitis of the left upper extremity from IV with mention at that time.  She was advised to monitor the swelling, elevate her arm, and she was discharged on doxycycline for 3 days.  Ultrasound left upper extremity on 9/16 did not show any clots so was determined that was probably all phlebitis at that time.  She was advised to have reevaluation in the outpatient setting.  Patient presents today from the rehab facility.  She reports she did not want to come in and she is not sure why she is here.  She reports she has had generalized weakness, nausea, left upper arm pain.  Patient reports that she has had diarrhea, nausea/vomiting, that nothing is controlled.  She reports that she has had generalized weakness secondary to those GI symptoms.  Patient reports that this is not occurred since her last admission.  Regarding her arm she reports swelling in her arm has gotten worse over the last 1 or 2 days.  She reports that it has been weeping at the facility.  She is not sure why they sent her today, she reports there is been no acute change.  She reports her left upper arm is a stabbing pain is constant, with intermittent  burning and paresthesias in her fingertips.  She has weakness in the arm but it secondary to pain per her report.  Patient ambulates with a wheelchair but is not able to grip the wheelchair, so she does have assistance moving even with that.  She denies any shortness of breath, palpitations, or chest pain.  She does report a decrease in appetite that she attributes to the recent GI symptoms she has had with the other 2 admissions.  She does report abdominal pain is residual.  Last bowel movement was yesterday.  Patient reports she still makes small amounts of urine and has not had any dysuria.  Of this left upper extremity edema-patient does not report any axillary procedures or lymph node resections that she knows of in that area.  She does report that she had 3 cysts removed from her left breast decades ago.  Patient does not smoke, does not drink alcohol, does not use illicit drugs.  Patient is vaccinated for COVID.  Patient is full code.  In the ED Temp 98.2, heart rate 75-89, respiratory rate 10-26, blood pressure 117/42, satting at 100%  Leukocytosis 22.1, hemoglobin up to 9.6 from recent 8.4-anemia of chronic disease Chemistry panel reveals a BUN of 48, creatinine of 8.02 Hyperglycemia 133 Respiratory panel negative CT chest shows pulmonary nodules was possible right lower lobe pneumonia.  Patient reports no respiratory symptoms. Admission was requested for further work-up of worsening left arm edema including repeat Doppler to rule out clot as this CT was not an ideal image-contrast cannot be introduced at the peripheral site that would have been pressurized    Review of systems:    In addition to the HPI above,  No Fever-chills, No Headache, No changes with Vision or hearing, No problems swallowing food or Liquids, No Chest pain, Cough or Shortness of Breath, No Abdominal pain, No Nausea or Vomiting, bowel movements are regular, No Blood in stool or Urine, No dysuria, No new skin  rashes or bruises No recent weight gain or loss, No polyuria, polydypsia or polyphagia, No significant Mental Stressors.  All other systems reviewed and are negative.    Past History of the following :    Past Medical History:  Diagnosis Date   Blood transfusion without reported diagnosis    CAD (coronary artery disease)    Nonobstructive at cardiac catheterization 2000   Cataract    Cervical cancer ([place]) 1978   CHF (congestive heart failure) (HCC)    Chronic back pain    Chronic kidney disease    Class 2 obesity with body mass index (BMI) of 35 to 39.9 without comorbidity    Complication of anesthesia    hard to awaken with one back surgery - years ago, no problem since   COPD (chronic obstructive pulmonary disease) ([place])    Degenerative disc disease    DM (diabetes mellitus), type 2 with renal complications ([place])    Dyslipidemia    Dyspnea    "due to COPD"   Dysrhythmia    a-fib   ESRD (end stage renal disease) on dialysis ([place])    TTHS- [address]   GERD (gastroesophageal reflux disease)    Gout    Hiatal hernia 07/27/2013   History of diverticulitis of colon    History of hiatal hernia    HTN (hypertension)    09/09/20 not currently on medication, is on medication for hypotension   Hypotension    Hypothyroidism, unspecified 04/23/2021   Iron deficiency anemia    Irritable bowel syndrome    Lumbar radiculopathy    Mixed hyperlipidemia    Moderate major depression, 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 States this was cervical cancer   Oxygen deficiency    room air   PAF (paroxysmal atrial fibrillation) (HCC)    Pneumonia    Sleep apnea    bipap - oxygen 2 l to bipap.   Type 2 diabetes mellitus ([place])    Vitamin B deficiency 12/25/2009   Vitamin B12 deficiency       Past Surgical History:  Procedure Laterality Date   ABDOMINAL HYSTERECTOMY      APPLICATION OF WOUND VAC Right 01/15/2021   Procedure: APPLICATION OF WOUND VAC;  Surgeon: Cherre Robins, MD;  Location: [place];  Service: Vascular;  Laterality: Right;   AV FISTULA PLACEMENT Left 08/05/2019   Procedure: ARTERIOVENOUS (AV) FISTULA CREATION LEFT ARM;  Surgeon: Waynetta Sandy, MD;  Location: [place];  Service: Vascular;  Laterality: Left;  AV FISTULA PLACEMENT Left 09/11/2020   Procedure: INSERTION OF ARTERIOVENOUS (AV) GORE-TEX GRAFT ARM LEFT;  Surgeon: Cherre Robins, MD;  Location: [place];  Service: Vascular;  Laterality: Left;   AV FISTULA PLACEMENT Right 11/29/2020   Procedure: Creation of Arteriovenous Fistula Right Upper Arm;  Surgeon: Rosetta Posner, MD;  Location: [place];  Service: Vascular;  Laterality: Right;   [place] Left 10/05/2019   Procedure: SECOND STAGE LEFT BASCILIC VEIN TRANSPOSITION;  Surgeon: Waynetta Sandy, MD;  Location: [place];  Service: Vascular;  Laterality: Left;   [place] Right 01/15/2021   Procedure: RIGHT UPPER ARM SECOND STAGE [place];  Surgeon: Cherre Robins, MD;  Location: [place];  Service: Vascular;  Laterality: Right;  PERIPHERAL NERVE BLOCK   Benign breast cysts     BIOPSY  02/26/2021   Procedure: BIOPSY;  Surgeon: Jackquline Denmark, MD;  Location: [facility] ENDOSCOPY;  Service: Endoscopy;;   CHOLECYSTECTOMY     COLONOSCOPY  10/01/2006   SLF:Pan colonic diverticulosis and moderate internal hemorrhoids/ Otherwise no polyps, masses, inflammatory changes or AVMs/   COLONOSCOPY  2011   SLF: pancolonic diverticulosis, large internal hemorrhoids   COLONOSCOPY N/A 01/26/2016   Procedure: COLONOSCOPY;  Surgeon: [name], MD;  [facility];  Service: Endoscopy;  Laterality: N/A;  830    COLONOSCOPY WITH PROPOFOL N/A 02/10/2020   Procedure: COLONOSCOPY WITH PROPOFOL;  Surgeon: Daneil Dolin, MD;  [facility];  Service: Endoscopy;  Laterality:  N/A;  10:45am   ESOPHAGEAL DILATION  02/26/2021   Procedure: ESOPHAGEAL DILATION;  Surgeon: Jackquline Denmark, MD;  Location[facility] END[facility];  Service: Endoscopy;;   ESOPHAGOGASTRODUODENOSCOPY  11/19/2006   SLF: Large hiatal hernia without evidence of Cameron ulcers/. Distal esophageal stricture, which allowed the gastroscope to pass without resistance.  A 16 mm Savary later passed with mild resistance/ Normal stomach.sb bx negative   ESOPHAGOGASTRODUODENOSCOPY  10/01/2006   LGX:QJJHE hiatal hernia.  Distal esophagus without evidence of   erythema, ulceration or Barrett's esophagus   ESOPHAGOGASTRODUODENOSCOPY  2011   SLF: large hh, distal esophageal web narrowing to 30mm s/p dilation to 47mm   ESOPHAGOGASTRODUODENOSCOPY N/A 08/06/2013   SLF: 1. Stricture at the gastroesophageal junction 2. large hiatal hernia. 3. Mild erosive gastritis.   ESOPHAGOGASTRODUODENOSCOPY (EGD) WITH PROPOFOL N/A 07/19/2019   rourk: Mild erosive reflux esophagitis.  Mild Schatzki ring status post dilation.  Large hiatal hernia with at least one half of the stomach above the diaphragm.  Gastric mucosa erythematous.   ESOPHAGOGASTRODUODENOSCOPY (EGD) WITH PROPOFOL N/A 02/26/2021   Procedure: ESOPHAGOGASTRODUODENOSCOPY (EGD) WITH PROPOFOL;  Surgeon: Jackquline Denmark, MD;  Location: [facility] ENDOSCOPY;  Service: Endoscopy;  Laterality: N/A;   GIVENS CAPSULE STUDY N/A 08/06/2013   INCOMPLETE-SMALL BOWLE ULCERS   IR FLUORO GUIDE CV LINE LEFT  07/29/2019   IR US GUIDE VASC ACCESS LEFT  07/29/2019   KNEE SURGERY Right    PARTIAL HYSTERECTOMY  1978   PORT-A-CATH REMOVAL Right 11/29/2020   Procedure: REMOVAL OF RIGHT CHEST PORT;  Surgeon: Rosetta Posner, MD;  Location: [place];  Service: Vascular;  Laterality: Right;   small bowel capsule  2008   negative   SPINE SURGERY     TONSILLECTOMY AND ADENOIDECTOMY     Two back surgeries/fusion     UMBILICAL HERNIA REPAIR  2010   UPPER EXTREMITY VENOGRAPHY N/A 11/22/2020   Procedure: UPPER EXTREMITY  VENOGRAPHY;  Surgeon: Jamelle Haring  N, MD;  Location: [place] [facility];  Service: Cardiovascular;  Laterality: N/A;      Social History:      Social History   Tobacco Use   Smoking status: Former    Packs/day: 1.00    Years: 1.00    Pack years: 1.00    Types: Cigarettes    Start date: 02/19/1961    Quit date: 08/05/1961    Years since quitting: 59.7   Smokeless tobacco: Never   Tobacco comments:    1 year in her lifetime  Substance Use Topics   Alcohol use: Never       Family History :     Family History  Problem Relation Age of Onset   Colon cancer Brother        diagnosed age 35. Living.    Ulcers Sister    Diabetes Sister    Heart attack Sister    Kidney failure Sister    Stroke Sister    Ulcers Mother    Diabetes Mother    Heart attack Mother    Stroke Mother    Asthma Mother    Heart disease Mother    Cervical cancer Mother    Heart attack Brother    Heart disease Brother    Asthma Sister    Diabetes Brother    Stroke Maternal Grandmother    Heart attack Maternal Grandmother    Heart attack Other    Early death Father        MVA in his 99s      Home Medications:   Prior to Admission medications   Medication Sig Start Date End Date Taking? Authorizing Provider  Methoxy PEG-Epoetin Beta (MIRCERA IJ) Mircera 05/01/21 04/30/22 Yes [provider]  Tuberculin PPD (TUBERSOL ID) Inject into the skin. 04/24/21  Yes [provider]  acetaminophen (TYLENOL) 325 MG tablet Take 650 mg by mouth daily as needed for headache (pain).    [provider]  allopurinol (ZYLOPRIM) 100 MG tablet Take 1 tablet (100 mg total) by mouth at bedtime. 03/18/21   Aline August, MD  amiodarone (PACERONE) 200 MG tablet Take 1 tablet (200 mg total) by mouth in the morning. 01/15/21   Dagoberto Ligas, PA-C  apixaban (ELIQUIS) 2.5 MG TABS tablet Take 2.5 mg by mouth 2 (two) times daily.    [provider]  atorvastatin (LIPITOR) 40 MG tablet Take  40 mg by mouth daily in the afternoon. In the afternoon Patient not taking: Reported on 04/13/2021 01/15/21   Dagoberto Ligas, PA-C  Biotin 5000 MCG TABS Take 5,000 mcg by mouth in the morning.    [provider]  Camphor-Menthol-Methyl Sal (SALONPAS) 3.08-10-08 % PTCH Place 1 patch onto the skin daily as needed (pain).    [provider]  Carboxymethylcellul-Glycerin (CLEAR EYES FOR DRY EYES) 1-0.25 % SOLN Place 1 drop into both eyes daily as needed (dry eys).    [provider]  Cholecalciferol (VITAMIN D3) 50 MCG (2000 UT) TABS Take 2,000 Units by mouth in the morning.    [provider]  doxycycline (ADOXA) 100 MG tablet Take 1 tablet (100 mg total) by mouth 2 (two) times daily. 04/20/21   Nita Sells, MD  esomeprazole (NEXIUM) 40 MG capsule Take 1 capsule (40 mg total) by mouth 2 (two) times daily before a meal. 04/20/21   Nita Sells, MD  ferric citrate (AURYXIA) 1 GM 210 MG(Fe) tablet Take 210 mg by mouth 3 (three) times daily with meals.  [provider]  Fluticasone-Umeclidin-Vilant (TRELEGY ELLIPTA) 100-62.5-25 MCG/INH AEPB Inhale 1 puff into the lungs daily at 12 noon.    [provider]  gabapentin (NEURONTIN) 100 MG capsule Take 100 mg by mouth at bedtime. 12/29/19   [provider]  iron sucrose in sodium chloride 0.9 % 100 mL Iron Sucrose (Venofer) 03/22/21 03/21/22  [provider]  midodrine (PROAMATINE) 10 MG tablet Take 1 tablet (10 mg total) by mouth 3 (three) times daily with meals. 04/20/21 05/20/21  Nita Sells, MD  nitroGLYCERIN (NITROSTAT) 0.4 MG SL tablet Place 0.4 mg under the tongue every 5 (five) minutes x 3 doses as needed for chest pain. 12/07/19   [provider]  OXYGEN Inhale 2 L into the lungs See admin instructions. Use every night and as needed during the day    [provider]  promethazine (PHENERGAN) 12.5 MG tablet Take 1 tablet (12.5 mg total) by mouth  every 8 (eight) hours as needed for nausea or vomiting. 03/08/21 04/13/21  Eloise Harman, DO     Allergies:    No Known Allergies   Physical Exam:   Vitals  Blood pressure (!) 122/47, pulse 74, temperature 98.2 F (36.8 C), temperature source Oral, resp. rate 18, height 4\' 8"  (1.422 m), weight 64 kg, SpO2 100 %.  1.  General: Patient lying supine in bed,  no acute distress   2. Psychiatric: Alert and oriented x 3, flat affect and behavior is normal for situation, pleasant and cooperative with exam   3. Neurologic: Speech and language are normal, face is symmetric, moves all 4 extremities voluntarily, weak in the left upper extremity the patient reports that is due to pain at baseline without acute deficits on limited exam   4. HEENMT:  Head is atraumatic, normocephalic, pupils reactive to light, neck is supple, trachea is midline, mucous membranes are moist   5. Respiratory : Lungs are clear to auscultation bilaterally without wheezing, rhonchi, rales, no cyanosis, no increase in work of breathing or accessory muscle use   6. Cardiovascular : Heart rate normal, rhythm is regular, no murmurs, rubs or gallops, no peripheral edema, peripheral pulses palpated   7. Gastrointestinal:  Abdomen is soft, nondistended, nontender to palpation bowel sounds active, no masses or organomegaly palpated   8. Skin:  Skin is warm, dry and intact without rashes, acute lesions, or ulcers on limited exam   9.Musculoskeletal:  No acute deformities or trauma, no asymmetry in tone, no peripheral edema, peripheral pulses palpated, no tenderness to palpation in the extremities     Data Review:    CBC Recent Labs  Lab 05/07/21 2116  WBC 22.1*  HGB 9.6*  HCT 29.6*  PLT 339  MCV 91.4  MCH 29.6  MCHC 32.4  RDW 17.5*  LYMPHSABS 1.2  MONOABS 1.0  EOSABS 0.0  BASOSABS 0.1    ------------------------------------------------------------------------------------------------------------------  Results for orders placed or performed during the hospital encounter of 05/07/21 (from the past 48 hour(s))  Resp Panel by RT-PCR (Flu A&B, Covid) Nasopharyngeal Swab     Status: None   Collection Time: 05/07/21  7:11 PM   Specimen: Nasopharyngeal Swab; Nasopharyngeal(NP) swabs in vial transport medium  Result Value Ref Range   SARS Coronavirus 2 by RT PCR NEGATIVE NEGATIVE    Comment: (NOTE) SARS-CoV-2 target nucleic acids are NOT DETECTED.  The SARS-CoV-2 RNA is generally detectable in upper respiratory specimens during the acute phase of infection. The lowest concentration of SARS-CoV-2 viral copies this assay  can detect is 138 copies/mL. A negative result does not preclude SARS-Cov-2 infection and should not be used as the sole basis for treatment or other patient management decisions. A negative result may occur with  improper specimen collection/handling, submission of specimen other than nasopharyngeal swab, presence of viral mutation(s) within the areas targeted by this assay, and inadequate number of viral copies(<138 copies/mL). A negative result must be combined with clinical observations, patient history, and epidemiological information. The expected result is Negative.  Fact Sheet for Patients:  EntrepreneurPulse.com.au  Fact Sheet for Healthcare Providers:  IncredibleEmployment.be  This test is no t yet approved or cleared by the [place] FDA and  has been authorized for detection and/or diagnosis of SARS-CoV-2 by FDA under an Emergency Use Authorization (EUA). This EUA will remain  in effect (meaning this test can be used) for the duration of the COVID-19 declaration under Section 564(b)(1) of the Act, 21 U.S.C.section 360bbb-3(b)(1), unless the authorization is terminated  or revoked sooner.        Influenza A by PCR NEGATIVE NEGATIVE   Influenza B by PCR NEGATIVE NEGATIVE    Comment: (NOTE) The Xpert Xpress SARS-CoV-2/FLU/RSV plus assay is intended as an aid in the diagnosis of influenza from Nasopharyngeal swab specimens and should not be used as a sole basis for treatment. Nasal washings and aspirates are unacceptable for Xpert Xpress SARS-CoV-2/FLU/RSV testing.  Fact Sheet for Patients: EntrepreneurPulse.com.au  Fact Sheet for Healthcare Providers: IncredibleEmployment.be  This test is not yet approved or cleared by the [place] FDA and has been authorized for detection and/or diagnosis of SARS-CoV-2 by FDA under an Emergency Use Authorization (EUA). This EUA will remain in effect (meaning this test can be used) for the duration of the COVID-19 declaration under Section 564(b)(1) of the Act, 21 U.S.C. section 360bbb-3(b)(1), unless the authorization is terminated or revoked.  Performed at [facility], [address] [place], [place] [address]   Basic metabolic panel     Status: Abnormal   Collection Time: 05/07/21  9:16 PM  Result Value Ref Range   Sodium 133 (L) 135 - 145 mmol/L   Potassium 3.6 3.5 - 5.1 mmol/L   Chloride 95 (L) 98 - 111 mmol/L   CO2 25 22 - 32 mmol/L   Glucose, Bld 133 (H) 70 - 99 mg/dL    Comment: Glucose reference range applies only to samples taken after fasting for at least 8 hours.   BUN 48 (H) 8 - 23 mg/dL   Creatinine, Ser 8.02 (H) 0.44 - 1.00 mg/dL   Calcium 8.9 8.9 - 10.3 mg/dL   GFR, Estimated 5 (L) >60 mL/min    Comment: (NOTE) Calculated using the CKD-EPI Creatinine Equation (2021)    Anion gap 13 5 - 15    Comment: Performed at [facility], [address], [place], [place] [address]  CBC with Differential     Status: Abnormal   Collection Time: 05/07/21  9:16 PM  Result Value Ref Range   WBC 22.1 (H) 4.0 - 10.5 K/uL   RBC 3.24 (L) 3.87 - 5.11 MIL/uL   Hemoglobin 9.6 (L) 12.0 - 15.0 g/dL    HCT 29.6 (L) 36.0 - 46.0 %   MCV 91.4 80.0 - 100.0 fL   MCH 29.6 26.0 - 34.0 pg   MCHC 32.4 30.0 - 36.0 g/dL   RDW 17.5 (H) 11.5 - 15.5 %   Platelets 339 150 - 400 K/uL   nRBC 0.6 (H) 0.0 - 0.2 %  Neutrophils Relative % 87 %   Neutro Abs 19.2 (H) 1.7 - 7.7 K/uL   Lymphocytes Relative 6 %   Lymphs Abs 1.2 0.7 - 4.0 K/uL   Monocytes Relative 4 %   Monocytes Absolute 1.0 0.1 - 1.0 K/uL   Eosinophils Relative 0 %   Eosinophils Absolute 0.0 0.0 - 0.5 K/uL   Basophils Relative 0 %   Basophils Absolute 0.1 0.0 - 0.1 K/uL   Immature Granulocytes 3 %   Abs Immature Granulocytes 0.56 (H) 0.00 - 0.07 K/uL    Comment: Performed at [facility], [address], [place], [place] [address]    Chemistries  Recent Labs  Lab 05/07/21 2116  NA 133*  K 3.6  CL 95*  CO2 25  GLUCOSE 133*  BUN 48*  CREATININE 8.02*  CALCIUM 8.9   ------------------------------------------------------------------------------------------------------------------  ------------------------------------------------------------------------------------------------------------------ GFR: Estimated Creatinine Clearance: 4.5 mL/min (A) (by C-G formula based on SCr of 8.02 mg/dL (H)). Liver Function Tests: No results for input(s): AST, ALT, ALKPHOS, BILITOT, PROT, ALBUMIN in the last 168 hours. No results for input(s): LIPASE, AMYLASE in the last 168 hours. No results for input(s): AMMONIA in the last 168 hours. Coagulation Profile: No results for input(s): INR, PROTIME in the last 168 hours. Cardiac Enzymes: No results for input(s): CKTOTAL, CKMB, CKMBINDEX, TROPONINI in the last 168 hours. BNP (last 3 results) No results for input(s): PROBNP in the last 8760 hours. HbA1C: No results for input(s): HGBA1C in the last 72 hours. CBG: No results for input(s): GLUCAP in the last 168 hours. Lipid Profile: No results for input(s): CHOL, HDL, LDLCALC, TRIG, CHOLHDL, LDLDIRECT in the last 72 hours. Thyroid Function  Tests: No results for input(s): TSH, T4TOTAL, FREET4, T3FREE, THYROIDAB in the last 72 hours. Anemia Panel: No results for input(s): VITAMINB12, FOLATE, FERRITIN, TIBC, IRON, RETICCTPCT in the last 72 hours.  --------------------------------------------------------------------------------------------------------------- Urine analysis:    Component Value Date/Time   COLORURINE YELLOW 04/24/2020 1528   APPEARANCEUR HAZY (A) 04/24/2020 1528   LABSPEC 1.013 04/24/2020 1528   PHURINE 7.0 04/24/2020 1528   GLUCOSEU NEGATIVE 04/24/2020 1528   HGBUR NEGATIVE 04/24/2020 1528   BILIRUBINUR NEGATIVE 04/24/2020 1528   KETONESUR NEGATIVE 04/24/2020 1528   PROTEINUR 100 (A) 04/24/2020 1528   UROBILINOGEN 0.2 01/29/2007 1500   NITRITE NEGATIVE 04/24/2020 1528   LEUKOCYTESUR NEGATIVE 04/24/2020 1528      Imaging Results:    CT CHEST W CONTRAST  Result Date: 05/07/2021 CLINICAL DATA:  Arm deep vein thrombosis suspected. Left arm swelling. EXAM: CT CHEST WITH CONTRAST TECHNIQUE: Multidetector CT imaging of the chest was performed during intravenous contrast administration. CONTRAST:  12mL OMNIPAQUE IOHEXOL 350 MG/ML SOLN COMPARISON:  CT chest 07/16/2006 FINDINGS: Cardiovascular: No acute vascular findings. Normal heart size. No pericardial effusion. Coronary artery calcifications are present. There are atherosclerotic calcifications of the aorta. Left-sided dual lumen catheter tip ends in the SVC. Mediastinum/Nodes: There are no enlarged mediastinal or hilar lymph nodes. The esophagus is nondilated. There is a large hiatal hernia with intrathoracic stomach. Hernia has increased when compared to the prior study. Visualized thyroid gland is within normal limits. Lungs/Pleura: There are 2 new pulmonary nodules measuring up to 4 mm in the right middle lobe images 4/102 and 103. Other scattered nodules measuring 4 mm or less appear similar to the prior study. There is compressive atelectasis of the bilateral  lower lobes. There may be some airspace disease with air bronchograms in the right lower lobe as well. There is no pleural  effusion or pneumothorax. Trachea and central airways are patent. Upper Abdomen: Cholecystectomy clips are present. Musculoskeletal: There is soft tissue edema at the level of the left elbow. No enlarged lymph nodes are identified. IMPRESSION: 1. Bilateral lower lobe atelectasis. Additional small amount of airspace disease in the right lower lobe worrisome for pneumonia. 2. Large hiatal hernia with intrathoracic stomach which has increased in size. 3. Multiple new pulmonary nodules. Most severe: 4 mm right solid pulmonary nodule. No routine follow-up imaging is recommended per Fleischner Society Guidelines. These guidelines do not apply to immunocompromised patients and patients with cancer. Follow up in patients with significant comorbidities as clinically warranted. For lung cancer screening, adhere to Lung-RADS guidelines. Reference: Radiology. 2017; 284(1):228-43. 4. Left elbow soft tissue edema. 5.  Aortic Atherosclerosis (ICD10-I70.0).  At Electronically Signed   By: Ronney Asters M.D.   On: 05/07/2021 22:26       Assessment & Plan:    Active Problems:   ESRD (end stage renal disease) on dialysis (HCC)   PAF (paroxysmal atrial fibrillation) (HCC)   Anemia in chronic kidney disease   Edema of left upper arm   Generalized weakness   Edema of left upper CTA chest does not show clot, however was not an ideal study Ultrasound rule out DVT in the a.m. Continue Eliquis Patient is very tender, and has a white count of 22.1.  Recently on Augmentin and doxycycline in the outpatient settings.  Not likely have cellulitis, but no other infection obvious at this time.  Initially Rocephin ordered, patient has lost IV access and may need a PICC line.  Antibiotic changed to clindamycin for tonight -while cellulitis is being ruled out Procalcitonin in the a.m. Control pain with K pad and  Voltaren gel Continue to monitor ESRD on HD Last dialysis session was missed on Saturday Due for dialysis tomorrow Nephro consult placed Continue to monitor Atrial fibrillation Continue Pacerone and Eliquis Chronic anemia In the setting of renal disease Hemoglobin stable No signs of active bleeding Trend CBC in the a.m. Generalized weakness Likely secondary to deconditioning from multiple hospitalizations Also poor p.o. intake contributing Encourage p.o. intake Consult PT Abnormal chest CT Shows possible right lower lobe pneumonia No cough, no fever, no respiratory symptoms noted Patient does have a leukocytosis IV access secondary to very difficult stick-patient is started on clindamycin which would cover aspiration pneumonia as would be expected in the right lower lobe, for cellulitis as mentioned in #1   DVT Prophylaxis-   Eliquis- SCDs   AM Labs Ordered, also please review Full Orders  Family Communication: No family at bedside Code Status: Full  Admission status: Observation Time spent in minutes : [place] DO

## 2021-05-08 NOTE — Evaluation (Signed)
Physical Therapy Evaluation Patient Details Name: [name] MRN: [id number] DOB: 08-02-1946 Today's Date: 05/08/2021  History of Present Illness  [name]  is a 75 y.o. female, with history of ESRD on hemodialysis Tuesday Thursday Saturday, extensive diverticulosis, recent hospitalizations for diverticulitis and GI bleed started on a course of Augmentin, then nausea and vomiting and weakness, which she was diagnosed with recurrent diverticulitis/prior gastritis with hematemesis, large hiatal hernia at high risk of volvulus, and superficial phlebitis of the left upper extremity from IV with mention at that time.  She was advised to monitor the swelling, elevate her arm, and she was discharged on doxycycline for 3 days.  Ultrasound left upper extremity on 9/16 did not show any clots so was determined that was probably all phlebitis at that time.  She was advised to have reevaluation in the outpatient setting.  Patient presents today from the rehab facility.  She reports she did not want to come in and she is not sure why she is here.  She reports she has had generalized weakness, nausea, left upper arm pain.  Patient reports that she has had diarrhea, nausea/vomiting, that nothing is controlled.  She reports that she has had generalized weakness secondary to those GI symptoms.  Patient reports that this is not occurred since her last admission.  Regarding her arm she reports swelling in her arm has gotten worse over the last 1 or 2 days.  She reports that it has been weeping at the facility.  She is not sure why they sent her today, she reports there is been no acute change.  She reports her left upper arm is a stabbing pain is constant, with intermittent burning and paresthesias in her fingertips.  She has weakness in the arm but it secondary to pain per her report.  Patient ambulates with a wheelchair but is not able to grip the wheelchair, so she does have assistance moving even with that.   She denies any shortness of breath, palpitations, or chest pain.  She does report a decrease in appetite that she attributes to the recent GI symptoms she has had with the other 2 admissions.  She does report abdominal pain is residual.  Last bowel movement was yesterday.  Patient reports she still makes small amounts of urine and has not had any dysuria.   Clinical Impression  Patient demonstrates slow labored movement for sitting up at bedside with limited use of LUE due to c/o severe pain with any pressure, very unsteady on feet and limited to standing at bedside, unable to take steps due to weakness.  Patient put back to bed with Max assist to reposition.  Patient will benefit from continued physical therapy in hospital and recommended venue below to increase strength, balance, endurance for safe ADLs and gait.         Recommendations for follow up therapy are one component of a multi-disciplinary discharge planning process, led by the attending physician.  Recommendations may be updated based on patient status, additional functional criteria and insurance authorization.  Follow Up Recommendations SNF    Equipment Recommendations  None recommended by PT    Recommendations for Other Services       Precautions / Restrictions Precautions Precautions: Fall Restrictions Weight Bearing Restrictions: No      Mobility  Bed Mobility Overal bed mobility: Needs Assistance Bed Mobility: Supine to Sit;Sit to Supine     Supine to sit: Mod assist;Max assist Sit to supine: Mod assist;Max assist  General bed mobility comments: slow labored movment with limited use of LUE due to increased pain    Transfers Overall transfer level: Needs assistance Equipment used: Rolling walker (2 wheeled) Transfers: Sit to/from Stand Sit to Stand: Mod assist;Max assist            Ambulation/Gait                Stairs            Wheelchair Mobility    Modified Rankin (Stroke  Patients Only)       Balance Overall balance assessment: Needs assistance Sitting-balance support: Feet supported;No upper extremity supported Sitting balance-Leahy Scale: Fair Sitting balance - Comments: seated at EOB   Standing balance support: During functional activity;Single extremity supported Standing balance-Leahy Scale: Poor Standing balance comment: using RW                             Pertinent Vitals/Pain Pain Assessment: Faces Faces Pain Scale: Hurts whole lot Pain Location: LUE with pressure Pain Descriptors / Indicators: Guarding;Grimacing;Sore Pain Intervention(s): Limited activity within patient's tolerance;Monitored during session;Repositioned    Home Living Family/patient expects to be discharged to:: Private residence Living Arrangements: Children Available Help at Discharge: Family;Available PRN/intermittently Type of Home: House Home Access: Level entry     Home Layout: One level Home Equipment: Walker - 2 wheels;Walker - 4 wheels Additional Comments: uses 2L O2 when needed (at night and during the day)    Prior Function Level of Independence: Independent with assistive device(s)   Gait / Transfers Assistance Needed: uses a RW at home for mobility  ADL's / Homemaking Assistance Needed: independent with ADLs (basin bathing only), son assists with IADLs, pt manages medications and can drive but doesn't typically        Hand Dominance   Dominant Hand: Right    Extremity/Trunk Assessment   Upper Extremity Assessment Upper Extremity Assessment: Generalized weakness;LUE deficits/detail LUE Deficits / Details: grossly 2+/5 LUE: Unable to fully assess due to pain LUE Sensation: WNL LUE Coordination: decreased gross motor    Lower Extremity Assessment Lower Extremity Assessment: Generalized weakness    Cervical / Trunk Assessment Cervical / Trunk Assessment: Normal  Communication   Communication: No difficulties  Cognition  Arousal/Alertness: Awake/alert Behavior During Therapy: WFL for tasks assessed/performed Overall Cognitive Status: Within Functional Limits for tasks assessed                                        General Comments      Exercises     Assessment/Plan    PT Assessment Patient needs continued PT services  PT Problem List Decreased strength;Decreased range of motion;Decreased activity tolerance;Decreased balance;Decreased mobility;Pain       PT Treatment Interventions DME instruction;Gait training;Therapeutic activities;Functional mobility training;Stair training;Therapeutic exercise;Balance training;Patient/family education    PT Goals (Current goals can be found in the Care Plan section)  Acute Rehab PT Goals Patient Stated Goal: return to rehab PT Goal Formulation: With patient Time For Goal Achievement: 05/15/2021 Potential to Achieve Goals: Good    Frequency Min 3X/week   Barriers to discharge        Co-evaluation               AM-PAC PT "6 Clicks" Mobility  Outcome Measure Help needed turning from your back to your side while in a  flat bed without using bedrails?: A Lot Help needed moving from lying on your back to sitting on the side of a flat bed without using bedrails?: A Lot Help needed moving to and from a bed to a chair (including a wheelchair)?: A Lot Help needed standing up from a chair using your arms (e.g., wheelchair or bedside chair)?: A Lot Help needed to walk in hospital room?: A Lot Help needed climbing 3-5 steps with a railing? : Total 6 Click Score: 11    End of Session Equipment Utilized During Treatment: Oxygen Activity Tolerance: Patient tolerated treatment well;Patient limited by fatigue;Patient limited by pain Patient left: in bed;with call bell/phone within reach Nurse Communication: Mobility status PT Visit Diagnosis: Unsteadiness on feet (R26.81);Other abnormalities of gait and mobility (R26.89);Muscle weakness  (generalized) (M62.81) Pain - Right/Left: Left Pain - part of body: Arm    Time: 4600-2984 PT Time Calculation (min) (ACUTE ONLY): 22 min   Charges:   PT Evaluation $PT Eval Moderate Complexity: 1 Mod PT Treatments $Therapeutic Activity: 8-22 mins        2:21 PM, 05/08/21 Lonell Grandchild, MPT Physical Therapist with [facility] [phone] office [phone] mobile phone

## 2021-05-08 NOTE — Plan of Care (Signed)
  Problem: Acute Rehab PT Goals(only PT should resolve) Goal: Pt Will Go Supine/Side To Sit 05/08/2021 1424 by Lonell Grandchild, PT Outcome: Progressing 05/08/2021 1423 by Lonell Grandchild, PT Outcome: Progressing Flowsheets (Taken 05/08/2021 1423) Pt will go Supine/Side to Sit:  with moderate assist  with minimal assist Goal: Patient Will Transfer Sit To/From Stand 05/08/2021 1424 by Lonell Grandchild, PT Outcome: Progressing 05/08/2021 1423 by Lonell Grandchild, PT Outcome: Progressing Flowsheets (Taken 05/08/2021 1423) Patient will transfer sit to/from stand: with moderate assist Goal: Pt Will Transfer Bed To Chair/Chair To Bed 05/08/2021 1424 by Lonell Grandchild, PT Outcome: Progressing 05/08/2021 1423 by Lonell Grandchild, PT Outcome: Progressing Flowsheets (Taken 05/08/2021 1423) Pt will Transfer Bed to Chair/Chair to Bed: with mod assist Goal: Pt Will Perform Standing Balance Or Pre-Gait 05/08/2021 1424 by Lonell Grandchild, PT Outcome: Progressing 05/08/2021 1423 by Lonell Grandchild, PT Outcome: Progressing Flowsheets (Taken 05/08/2021 1423) Pt will perform standing balance or pre-gait:  with minimal assist  with moderate assist   Problem: Acute Rehab PT Goals(only PT should resolve) Goal: Pt Will Ambulate Outcome: Progressing Flowsheets (Taken 05/08/2021 1424) Pt will Ambulate:  15 feet  with moderate assist  with rolling walker   2:24 PM, 05/08/21 Lonell Grandchild, MPT Physical Therapist with [facility] [phone] office [phone] mobile phone

## 2021-05-08 NOTE — TOC Initial Note (Addendum)
Transition of Care [facility]) - Initial/Assessment Note    Patient Details  Name: [name] MRN: [id number] Date of Birth: Oct 23, 1945  [facility] CM/SW Contact:    Salome Arnt, [place] Phone Number: 05/08/2021, 2:56 PM  Clinical Narrative:  Pt in observation due to edema of left arm. Pt has been a resident at [facility] for about 2 weeks for rehab. Pt's son, [name] requested to speak to LCSW. [name] is concerned that pt isn't receiving assistance with trilogy machine at night. LCSW addressed with SNF who will discuss with DON to make sure pt is wearing trilogy every night. Pt is on dialysis at [facility] on TTS schedule. She is scheduled for dialysis today. Per Lenna Sciara at [facility], okay for return. No FL2 needed as long as observation. Pt may d/c today after dialysis. Facility aware and need d/c summary by 4:00 although pt can come at anytime after. MD notified.                   Expected Discharge Plan: Skilled Nursing Facility Barriers to Discharge: Continued Medical Work up   Patient Goals and CMS Choice Patient states their goals for this hospitalization and ongoing recovery are:: return to SNF   Choice offered to / list presented to : Adult Children  Expected Discharge Plan and Services Expected Discharge Plan: [place] In-house Referral: Clinical Social Work   Post Acute Care Choice: [place] Living arrangements for the past 2 months: [place]                 DME Arranged: N/A                    Prior Living Arrangements/Services Living arrangements for the past 2 months: [place] Lives with:: Facility Resident Patient language and need for interpreter reviewed:: Yes Do you feel safe going back to the place where you live?: Yes      Need for Family Participation in Patient Care: Yes (Comment) Care giver support system in place?: Yes (comment)   Criminal Activity/Legal  Involvement Pertinent to Current Situation/Hospitalization: No - Comment as needed  Activities of Daily Living      Permission Sought/Granted Permission sought to share information with : Facility Art therapist granted to share information with : Yes, Verbal Permission Granted     Permission granted to share info w AGENCY: [employer] granted to share info w Relationship: SNF     Emotional Assessment         Alcohol / Substance Use: Not Applicable Psych Involvement: No (comment)  Admission diagnosis:  Edema of left upper arm [R60.0] Patient Active Problem List   Diagnosis Date Noted   Edema of left upper arm 05/08/2021   Generalized weakness 05/08/2021   Hypothyroidism, unspecified 04/23/2021   Diverticulitis    Non-intractable vomiting    Anticoagulated 03/05/2021   GI bleed 02/25/2021   Acute blood loss anemia 02/25/2021   Hypotension 02/25/2021   Presence of cardiac and vascular implant and graft, unspecified 11/14/2020   Diverticulitis of colon without hemorrhage 09/08/2020   Other disorders of phosphorus metabolism 08/31/2020   Acute respiratory failure due to COVID-19 ([place]) 07/20/2020   Acute respiratory failure with hypoxia ([place]) 07/20/2020   Allergy, unspecified, sequela 06/28/2020   Personal history of anaphylaxis 06/28/2020   Recurrent falls 04/25/2020   Fall 04/24/2020   ESRD (end stage renal disease) on dialysis ([place])  Essential hypertension    Dyslipidemia    DM (diabetes mellitus), type 2 with renal complications (HCC)    Class 2 obesity with body mass index (BMI) of 35 to 39.9 without comorbidity    OSA (obstructive sleep apnea)    COPD (chronic obstructive pulmonary disease) (HCC)    PAF (paroxysmal atrial fibrillation) (HCC)    Acute respiratory distress 03/09/2020   Fluid overload, unspecified 03/09/2020   Hyperkalemia 03/06/2020   GERD with esophagitis 03/06/2020   Acute bacterial bronchitis 03/06/2020    Anemia in chronic kidney disease 11/17/2019   Coagulation defect, unspecified ([place]) 11/17/2019   Pain, unspecified 11/17/2019   Pruritus, unspecified 11/17/2019   Secondary hyperparathyroidism of renal origin ([place]) 11/17/2019   Unspecified protein-calorie malnutrition ([place]) 11/17/2019   Loss of weight 10/22/2019   Goals of care, counseling/discussion 07/23/2019   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 07/21/2019   Paroxysmal atrial fibrillation ([place]) 07/21/2019   Encephalopathy 07/21/2019   Moderate major depression, single episode ([place]) 07/21/2019   Pressure ulcer 07/20/2019   Anorexia 07/10/2019   Weight gain with edema 07/02/2019   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 ([place]) 06/22/2019   Neuropathy due to type 2 diabetes mellitus ([place]) 06/22/2019   Mixed diabetic hyperlipidemia associated with type 2 diabetes mellitus ([place]) 06/22/2019   Chronic gout 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 06/22/2019   Quality of life palliative care encounter 06/19/2019   Atrial fibrillation with RVR (HCC)    Atrial flutter with rapid ventricular response ([place]) 06/16/2019   Dyspnea and respiratory abnormalities 06/01/2019   Acute on chronic respiratory failure with hypoxia and hypercapnia with respiratory acidosis 06/01/2019   Anasarca 05/31/2019   Nausea 05/31/2019   Generalized abdominal pain 05/31/2019   History of colonic polyps 05/31/2019   Lymphedema of both lower extremities 04/28/2019   Cellulitis of finger of right hand 08/19/2018   Chronic respiratory failure with hypoxia ([place]) 07/13/2018   Acute renal failure superimposed on stage 3 chronic kidney disease ([place]) 07/13/2018   Chronic diastolic CHF (congestive heart failure) ([place]) 07/13/2018   COPD (chronic obstructive pulmonary disease) ([place]) 07/13/2018   Flatulence 09/18/2017   IBS (irritable bowel syndrome) 04/03/2017   Acute on chronic diastolic CHF  (congestive heart failure) ([place]) 05/08/2016   COPD with acute exacerbation ([place]) 05/08/2016   Constipation 04/25/2016   Peripheral vertigo 06/27/2015   Intractable nausea and vomiting 03/29/2015   Diarrhea, unspecified 12/14/2014   Port-A-Cath in place 10/06/2014   Chronic obstructive pulmonary disease ([place]) 04/30/2014   Obesity, Class III, BMI 40-49.9 (morbid obesity) ([place]) 04/30/2014   Hemorrhoids, internal, with bleeding 11/10/2013   Symptomatic anemia 08/18/2013   Other diseases of tongue 08/18/2013   Dark stools 07/27/2013   Chest pain 09/05/2012   Arthropathy 12/09/2011   Edema 12/09/2011   Gout 12/09/2011   Hiatal hernia with GERD and esophagitis    Obstructive chronic bronchitis without exacerbation ([place]) 12/26/2009   Hematochezia 12/26/2009   Dysphagia 12/26/2009   DM type 2 with diabetic peripheral neuropathy ([place]) 12/25/2009   Vitamin B deficiency 12/25/2009   Iron deficiency anemia due to chronic blood loss 12/25/2009   Benign essential HTN 12/25/2009   DEGENERATIVE DISC DISEASE 12/25/2009   OSA (obstructive sleep apnea) 12/25/2009   HLD (hyperlipidemia) 12/25/2009   Type 2 diabetes mellitus with ESRD (end-stage renal disease) ([place]) 12/25/2009   Narrowing of intervertebral disc space 12/25/2009   PCP:  [name], MD Pharmacy:  [facility] - [place], [place] [address] [place] [address] Phone: [phone] Fax: [place], [place] [facility] [place] [address] Phone: [phone] Fax: [phone]     Social Determinants of Health (SDOH) Interventions    Readmission Risk Interventions Readmission Risk Prevention Plan 07/20/2020 04/28/2020 03/08/2020  Transportation Screening Complete Complete Complete  PCP or Specialist Appt within 3-5 Days - Not Complete -  Not Complete comments - going to SNF -  [place] or [place] - Complete -  [place] or Home Care Consult comments - - -  Social Work Consult for [place] Planning/Counseling - Complete -  Palliative Care Screening - Not Applicable -  Medication Review ([place]) Referral to Pharmacy Complete Complete  PCP or Specialist appointment within 3-5 days of discharge Complete - Complete  PCP/Specialist Appt Not Complete comments - - -  [place]RI or Home Care Consult Complete - Complete  SW Recovery Care/Counseling Consult Complete - Complete  Palliative Care Screening Not Applicable - Not [place] Not Applicable - Not Applicable  Some recent data might be hidden

## 2021-05-08 NOTE — Procedures (Addendum)
   HEMODIALYSIS TREATMENT NOTE:   4 hour heparin-free HD completed via left IJ TDC. Exit site is unremarkable. Goal met: 1.5 liters removed without interruption in UF.  All blood was returned.  Report called to Teacher, adult education at [facility].  EMS transport pending.  Rockwell Alexandria, RN   Infectious Diseases Result Type Result Value Relevant Reference Range Interpretation Date  Hep B core Ab Total (anti-HBc) Negative Negative - April 24, 2021  HCV Ab (anti-HCV) Nonreactive Non-Reactive - April 24, 2021  Hep B Surface Ag (HBsAg) Negative Negative - April 24, 2021  Hep B Surface Ab (anti-HBs) < 10 mIU/mL < 10 mIU/mL, Non- Immune  - April 24, 2021

## 2021-05-09 LAB — HEPATITIS B SURFACE ANTIBODY,QUALITATIVE: Hep B S Ab: NONREACTIVE

## 2021-05-09 LAB — HEPATITIS B SURFACE ANTIBODY, QUANTITATIVE: Hep B S AB Quant (Post): <3.1 m[IU]/mL — ABNORMAL LOW (ref >9.9)

## 2021-05-14 NOTE — Progress Notes (Signed)
[facility] [place]  ASSESSMENT / PLAN: 75 y.o. female well-known to me with end-stage renal disease dialyzing Monday, Wednesday and Friday via a left internal jugular tunneled dialysis catheter.  She has failed multiple attempts of access including left upper extremity fistula and graft.  Most recently she underwent right first and second stage brachiobasilic arteriovenous fistula creation which has thrombosed in short order.  I think the only option left for her is a right upper extremity arteriovenous graft which I would consider doing with a bovine carotid graft.  She is not interested in further attempts at dialysis access surgery.  She should use a catheter indefinitely for dialysis access. She can follow-up with me as needed.  CHIEF COMPLAINT: Dialysis access surgery  HISTORY OF PRESENT ILLNESS: [name] is a 75 y.o. female well-known to me with end-stage renal disease dialyzing Monday, Wednesday and Friday via a left internal jugular tunneled dialysis catheter.  She has failed multiple attempts of access including left upper extremity fistula and graft.  Most recently she underwent right first (11/29/20) and second stage (4/58/09) brachiobasilic arteriovenous fistula creation which thrombosed in Sepetember 2022. Today in clinic, patient appears profoundly deconditioned.  She has a sacral decubitus ulcer which is causing her significant discomfort.  She is oxygen dependent.  She is not interested in any further attempts at upper extremity dialysis access surgery.Marland Kitchen    VASCULAR SURGICAL HISTORY:  As above, multiple failed hemodialysis access surgeries. 08/05/19 - LUE 1st stage BVT 10/05/19 - LUE 2nd stage BVT 09/11/20 - LUE AVG 11/29/20 - RUE 1st stage BVT 01/15/21 - RUE 2nd stage BVT  VASCULAR RISK FACTORS: Negative history of stroke / transient ischemic attack. Positive history of coronary artery disease. + history of PCI. Positive history of diabetes  mellitus. Last A1c 5.1. Positive history of smoking. Limited - only smoked for 1 year. Not actively smoking. Positive history of hypertension.  Positive history of chronic kidney disease. ESRD. Negative history of chronic obstructive pulmonary disease.  FUNCTIONAL STATUS : Lives in a facility Dependent on a wheelchair for mobility Nonambulatory  Past Medical History:  Diagnosis Date   Blood transfusion without reported diagnosis    CAD (coronary artery disease)    Nonobstructive at cardiac catheterization 2000   Cataract    Cervical cancer ([place]) 1978   CHF (congestive heart failure) (HCC)    Chronic back pain    Chronic kidney disease    Class 2 obesity with body mass index (BMI) of 35 to 39.9 without comorbidity    Complication of anesthesia    hard to awaken with one back surgery - years ago, no problem since   COPD (chronic obstructive pulmonary disease) ([place])    Degenerative disc disease    DM (diabetes mellitus), type 2 with renal complications ([place])    Dyslipidemia    Dyspnea    "due to COPD"   Dysrhythmia    a-fib   ESRD (end stage renal disease) on dialysis ([place])    TTHS- [address]   GERD (gastroesophageal reflux disease)    Gout    Hiatal hernia 07/27/2013   History of diverticulitis of colon    History of hiatal hernia    HTN (hypertension)    09/09/20 not currently on medication, is on medication for hypotension   Hypotension    Hypothyroidism, unspecified 04/23/2021   Iron deficiency anemia    Irritable bowel syndrome    Lumbar radiculopathy    Mixed hyperlipidemia  Moderate major depression, 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 States this was cervical cancer   Oxygen deficiency    room air   PAF (paroxysmal atrial fibrillation) (HCC)    Pneumonia    Sleep apnea    bipap - oxygen 2 l to bipap.   Type 2 diabetes mellitus ([place])    Vitamin  B deficiency 12/25/2009   Vitamin B12 deficiency     Past Surgical History:  Procedure Laterality Date   ABDOMINAL HYSTERECTOMY     APPLICATION OF WOUND VAC Right 01/15/2021   Procedure: APPLICATION OF WOUND VAC;  Surgeon: Cherre Robins, MD;  Location: [facility];  Service: Vascular;  Laterality: Right;   AV FISTULA PLACEMENT Left 08/05/2019   Procedure: ARTERIOVENOUS (AV) FISTULA CREATION LEFT ARM;  Surgeon: Waynetta Sandy, MD;  Location: [place];  Service: Vascular;  Laterality: Left;   AV FISTULA PLACEMENT Left 09/11/2020   Procedure: INSERTION OF ARTERIOVENOUS (AV) GORE-TEX GRAFT ARM LEFT;  Surgeon: Cherre Robins, MD;  Location: [place];  Service: Vascular;  Laterality: Left;   AV FISTULA PLACEMENT Right 11/29/2020   Procedure: Creation of Arteriovenous Fistula Right Upper Arm;  Surgeon: Rosetta Posner, MD;  Location: [facility];  Service: Vascular;  Laterality: Right;   [place] Left 10/05/2019   Procedure: SECOND STAGE LEFT BASCILIC VEIN TRANSPOSITION;  Surgeon: Waynetta Sandy, MD;  Location: [facility];  Service: Vascular;  Laterality: Left;   [place] Right 01/15/2021   Procedure: RIGHT UPPER ARM SECOND STAGE [place];  Surgeon: Cherre Robins, MD;  Location: [facility];  Service: Vascular;  Laterality: Right;  PERIPHERAL NERVE BLOCK   Benign breast cysts     BIOPSY  02/26/2021   Procedure: BIOPSY;  Surgeon: Jackquline Denmark, MD;  Location[facility] ENDOSCOPY;  Service: Endoscopy;;   CHOLECYSTECTOMY     COLONOSCOPY  10/01/2006   SLF:Pan colonic diverticulosis and moderate internal hemorrhoids/ Otherwise no polyps, masses, inflammatory changes or AVMs/   COLONOSCOPY  2011   SLF: pancolonic diverticulosis, large internal hemorrhoids   COLONOSCOPY N/A 01/26/2016   Procedure: COLONOSCOPY;  Surgeon: [name], MD;  [facility];  Service: Endoscopy;  Laterality: N/A;  830    COLONOSCOPY WITH PROPOFOL N/A  02/10/2020   Procedure: COLONOSCOPY WITH PROPOFOL;  Surgeon: Daneil Dolin, MD;  [facility];  Service: Endoscopy;  Laterality: N/A;  10:45am   ESOPHAGEAL DILATION  02/26/2021   Procedure: ESOPHAGEAL DILATION;  Surgeon: Jackquline Denmark, MD;  Location: [facility] ENDOSCOPY;  Service: Endoscopy;;   ESOPHAGOGASTRODUODENOSCOPY  11/19/2006   SLF: Large hiatal hernia without evidence of Cameron ulcers/. Distal esophageal stricture, which allowed the gastroscope to pass without resistance.  A 16 mm Savary later passed with mild resistance/ Normal stomach.sb bx negative   ESOPHAGOGASTRODUODENOSCOPY  10/01/2006   IWP:YKDXI hiatal hernia.  Distal esophagus without evidence of   erythema, ulceration or Barrett's esophagus   ESOPHAGOGASTRODUODENOSCOPY  2011   SLF: large hh, distal esophageal web narrowing to 76mm s/p dilation to 54mm   ESOPHAGOGASTRODUODENOSCOPY N/A 08/06/2013   SLF: 1. Stricture at the gastroesophageal junction 2. large hiatal hernia. 3. Mild erosive gastritis.   ESOPHAGOGASTRODUODENOSCOPY (EGD) WITH PROPOFOL N/A 07/19/2019   rourk: Mild erosive reflux esophagitis.  Mild Schatzki ring status post dilation.  Large hiatal hernia  with at least one half of the stomach above the diaphragm.  Gastric mucosa erythematous.   ESOPHAGOGASTRODUODENOSCOPY (EGD) WITH PROPOFOL N/A 02/26/2021   Procedure: ESOPHAGOGASTRODUODENOSCOPY (EGD) WITH PROPOFOL;  Surgeon: Jackquline Denmark, MD;  Location[facility] ENDOSCOPY;  Service: Endoscopy;  Laterality: N/A;   GIVENS CAPSULE STUDY N/A 08/06/2013   INCOMPLETE-SMALL BOWLE ULCERS   IR FLUORO GUIDE CV LINE LEFT  07/29/2019   IR US GUIDE VASC ACCESS LEFT  07/29/2019   KNEE SURGERY Right    PARTIAL HYSTERECTOMY  1978   PORT-A-CATH REMOVAL Right 11/29/2020   Procedure: REMOVAL OF RIGHT CHEST PORT;  Surgeon: Rosetta Posner, MD;  Location: [place];  Service: Vascular;  Laterality: Right;   small bowel capsule  2008   negative   SPINE SURGERY     TONSILLECTOMY AND ADENOIDECTOMY      Two back surgeries/fusion     UMBILICAL HERNIA REPAIR  2010   UPPER EXTREMITY VENOGRAPHY N/A 11/22/2020   Procedure: UPPER EXTREMITY VENOGRAPHY;  Surgeon: Cherre Robins, MD;  Location: [facility];  Service: Cardiovascular;  Laterality: N/A;    Family History  Problem Relation Age of Onset   Colon cancer Brother        diagnosed age 60. Living.    Ulcers Sister    Diabetes Sister    Heart attack Sister    Kidney failure Sister    Stroke Sister    Ulcers Mother    Diabetes Mother    Heart attack Mother    Stroke Mother    Asthma Mother    Heart disease Mother    Cervical cancer Mother    Heart attack Brother    Heart disease Brother    Asthma Sister    Diabetes Brother    Stroke Maternal Grandmother    Heart attack Maternal Grandmother    Heart attack Other    Early death Father        MVA in his 63s    Social History   Socioeconomic History   Marital status: Widowed    Spouse name: Buhman   Number of children: 4   Years of education: Not on file   Highest education level: 10th grade  Occupational History   Occupation: retired    Comment: Ambulance person, [employer] work   Occupation: retired  Tobacco Use   Smoking status: Former    Packs/day: 1.00    Years: 1.00    Pack years: 1.00    Types: Cigarettes    Start date: 02/19/1961    Quit date: 08/05/1961    Years since quitting: 59.8   Smokeless tobacco: Never   Tobacco comments:    1 year in her lifetime  Vaping Use   Vaping Use: Never used  Substance and Sexual Activity   Alcohol use: Never   Drug use: Never   Sexual activity: Not Currently  Other Topics Concern   Not on file  Social History Narrative   ** Merged History Encounter **       HAS 4 SON-GRAND KIDS [place]. Lives in home with [place] - married 12 Y Cook, sew, quilt, crochet   Social Determinants of Health   Financial Resource Strain: Not on file  Food Insecurity: Not on file  Transportation Needs: Not on file   Physical Activity: Not on file  Stress: Not on file  Social Connections: Not on file  Intimate Partner Violence: Not on file    No Known Allergies  Current Outpatient  Medications  Medication Sig Dispense Refill   acetaminophen (TYLENOL) 325 MG tablet Take 650 mg by mouth daily as needed for headache (pain).     allopurinol (ZYLOPRIM) 100 MG tablet Take 1 tablet (100 mg total) by mouth at bedtime.     amiodarone (PACERONE) 200 MG tablet Take 1 tablet (200 mg total) by mouth in the morning.     apixaban (ELIQUIS) 2.5 MG TABS tablet Take 2.5 mg by mouth 2 (two) times daily.     atorvastatin (LIPITOR) 40 MG tablet Take 40 mg by mouth daily in the afternoon.     Biotin 5000 MCG TABS Take 5,000 mcg by mouth in the morning.     budesonide-formoterol (SYMBICORT) 160-4.5 MCG/ACT inhaler Inhale 1 puff into the lungs daily.     Camphor-Menthol-Methyl Sal (SALONPAS) 3.08-10-08 % PTCH Place 1 patch onto the skin daily as needed (pain).     Carboxymethylcellul-Glycerin (CLEAR EYES FOR DRY EYES) 1-0.25 % SOLN Place 1 drop into both eyes daily as needed (dry eys).     Cholecalciferol (VITAMIN D3) 50 MCG (2000 UT) TABS Take 2,000 Units by mouth in the morning.     esomeprazole (NEXIUM) 40 MG capsule Take 1 capsule (40 mg total) by mouth 2 (two) times daily before a meal. 60 capsule 0   ferric citrate (AURYXIA) 1 GM 210 MG(Fe) tablet Take 210 mg by mouth 3 (three) times daily with meals.     gabapentin (NEURONTIN) 100 MG capsule Take 100 mg by mouth at bedtime.     iron sucrose in sodium chloride 0.9 % 100 mL Iron Sucrose (Venofer)     Methoxy PEG-Epoetin Beta (MIRCERA IJ) Mircera     midodrine (PROAMATINE) 10 MG tablet Take 1 tablet (10 mg total) by mouth 3 (three) times daily with meals. 90 tablet 0   nitroGLYCERIN (NITROSTAT) 0.4 MG SL tablet Place 0.4 mg under the tongue every 5 (five) minutes x 3 doses as needed for chest pain.     OXYGEN Inhale 2 L into the lungs See admin instructions. Use every  night and as needed during the day     traMADol (ULTRAM) 50 MG tablet Take 1 tablet (50 mg total) by mouth every 12 (twelve) hours as needed for moderate pain. 20 tablet 0   Tuberculin PPD (TUBERSOL ID) Inject into the skin.     promethazine (PHENERGAN) 12.5 MG tablet Take 1 tablet (12.5 mg total) by mouth every 8 (eight) hours as needed for nausea or vomiting. 30 tablet 2   No current facility-administered medications for this visit.    REVIEW OF SYSTEMS:  [X]  denotes positive finding, [ ]  denotes negative finding Cardiac  Comments:  Chest pain or chest pressure:    Shortness of breath upon exertion:    Short of breath when lying flat:    Irregular heart rhythm:        Vascular    Pain in calf, thigh, or hip brought on by ambulation:    Pain in feet at night that wakes you up from your sleep:     Blood clot in your veins:    Leg swelling:         Pulmonary    Oxygen at home: x   Productive cough:     Wheezing:         Neurologic    Sudden weakness in arms or legs:  x   Sudden numbness in arms or legs:     Sudden onset of difficulty speaking  or slurred speech:    Temporary loss of vision in one eye:     Problems with dizziness:         Gastrointestinal    Blood in stool:     Vomited blood:         Genitourinary    Burning when urinating:     Blood in urine:        Psychiatric    Major depression:         Hematologic    Bleeding problems:    Problems with blood clotting too easily:        Skin    Rashes or ulcers:        Constitutional    Fever or chills:      PHYSICAL EXAM Vitals:   05/15/21 0835  BP: 98/62  Pulse: 97  Resp: 20  Temp: 97.7 F (36.5 C)  SpO2: 100%    Constitutional: chronically ill appearing. no distress. Appears well nourished.  Neurologic: CN intact. no focal findings. no sensory loss. Psychiatric:  Mood and affect symmetric and appropriate. Eyes:  No icterus. No conjunctival pallor. Ears, nose, throat:  mucous membranes moist.  Midline trachea.  Cardiac: regular rate and rhythm.  Respiratory:  unlabored. Abdominal:  soft, non-tender, non-distended.  Peripheral vascular: thrombosed RUE AVF. L chest tunneled dialysis catheter in place. Extremity: 2+ edema globally. no cyanosis. no pallor.  Skin: no gangrene. no ulceration.  Lymphatic: no Stemmer's sign. no palpable lymphadenopathy.  PERTINENT LABORATORY AND RADIOLOGIC DATA  Most recent CBC CBC Latest Ref Rng & Units 05/08/2021 05/07/2021 04/21/2021  WBC 4.0 - 10.5 K/uL 19.0(H) 22.1(H) 12.6(H)  Hemoglobin 12.0 - 15.0 g/dL 9.4(L) 9.6(L) 8.4(L)  Hematocrit 36.0 - 46.0 % 29.6(L) 29.6(L) 26.7(L)  Platelets 150 - 400 K/uL 303 339 324     Most recent CMP CMP Latest Ref Rng & Units 05/08/2021 05/07/2021 04/21/2021  Glucose 70 - 99 mg/dL 105(H) 133(H) 75  BUN 8 - 23 mg/dL 49(H) 48(H) 12  Creatinine 0.44 - 1.00 mg/dL 7.80(H) 8.02(H) 4.50(H)  Sodium 135 - 145 mmol/L 133(L) 133(L) 134(L)  Potassium 3.5 - 5.1 mmol/L 3.9 3.6 3.4(L)  Chloride 98 - 111 mmol/L 97(L) 95(L) 96(L)  CO2 22 - 32 mmol/L 24 25 27   Calcium 8.9 - 10.3 mg/dL 8.7(L) 8.9 8.1(L)  Total Protein 6.5 - 8.1 g/dL 5.7(L) - 5.4(L)  Total Bilirubin 0.3 - 1.2 mg/dL 0.8 - 1.2  Alkaline Phos 38 - 126 U/L 109 - 63  AST 15 - 41 U/L 14(L) - 8(L)  ALT 0 - 44 U/L 11 - 7    Renal function Estimated Creatinine Clearance: 4.6 mL/min (A) (by C-G formula based on SCr of 7.8 mg/dL (H)).  Hgb A1c MFr Bld (%)  Date Value  04/24/2020 6.0 (H)    LDL Cholesterol (Calc)  Date Value Ref Range Status  10/10/2017 186 (H) mg/dL (calc) Final    Comment:    Reference range: <100 . Desirable range <100 mg/dL for primary prevention;   <70 mg/dL for patients with CHD or diabetic patients  with > or = 2 CHD risk factors. Marland Kitchen LDL-C is now calculated using the Martin-Hopkins  calculation, which is a validated novel method providing  better accuracy than the Friedewald equation in the  estimation of LDL-C.  Cresenciano Genre et al.  Annamaria Helling. 9983;382(50): 2061-2068  (http://education.[facility].com/faq/FAQ164)    LDL Cholesterol  Date Value Ref Range Status  03/14/2021 60 0 - 99 mg/dL Final    Comment:  Total Cholesterol/HDL:CHD Risk Coronary Heart Disease Risk Table                     Men   Women  1/2 Average Risk   3.4   3.3  Average Risk       5.0   4.4  2 X Average Risk   9.6   7.1  3 X Average Risk  23.4   11.0        Use the calculated Patient Ratio above and the CHD Risk Table to determine the patient's CHD Risk.        ATP III CLASSIFICATION (LDL):  <100     mg/dL   Optimal  100-129  mg/dL   Near or Above                    Optimal  130-159  mg/dL   Borderline  160-189  mg/dL   High  >190     mg/dL   Very High Performed at [facility] [address], [place], [place] [address]     [name]. Stanford Breed, MD [facility] Phone Number: [phone] 05/15/2021 8:56 AM  Total time spent on preparing this encounter including chart review, data review, collecting history, examining the patient, coordinating care for this established patient, 20 minutes.  Portions of this report may have been transcribed using voice recognition software.  Every effort has been made to ensure accuracy; however, inadvertent computerized transcription errors may still be present.

## 2021-05-15 ENCOUNTER — Ambulatory Visit (INDEPENDENT_AMBULATORY_CARE_PROVIDER_SITE_OTHER): Payer: Medicare Other | Admitting: Vascular Surgery

## 2021-05-15 ENCOUNTER — Encounter: Payer: Self-pay | Admitting: Vascular Surgery

## 2021-05-15 ENCOUNTER — Other Ambulatory Visit: Payer: Self-pay

## 2021-05-15 VITALS — BP 98/62 | HR 97 | Temp 97.7°F | Resp 20

## 2021-05-15 DIAGNOSIS — N186 End stage renal disease: Secondary | ICD-10-CM | POA: Diagnosis not present

## 2021-05-15 DIAGNOSIS — Z992 Dependence on renal dialysis: Secondary | ICD-10-CM

## 2021-05-17 ENCOUNTER — Emergency Department (HOSPITAL_COMMUNITY)
Admission: EM | Admit: 2021-05-17 | Discharge: 2021-05-17 | Disposition: A | Discharge status: Home / Self Care | Payer: Medicare Other | Attending: Emergency Medicine | Admitting: Emergency Medicine

## 2021-05-17 ENCOUNTER — Other Ambulatory Visit: Payer: Self-pay

## 2021-05-17 ENCOUNTER — Emergency Department (HOSPITAL_COMMUNITY): Payer: Medicare Other

## 2021-05-17 ENCOUNTER — Encounter (HOSPITAL_COMMUNITY): Payer: Self-pay

## 2021-05-17 DIAGNOSIS — E039 Hypothyroidism, unspecified: Secondary | ICD-10-CM | POA: Diagnosis not present

## 2021-05-17 DIAGNOSIS — K219 Gastro-esophageal reflux disease without esophagitis: Secondary | ICD-10-CM | POA: Insufficient documentation

## 2021-05-17 DIAGNOSIS — Z87891 Personal history of nicotine dependence: Secondary | ICD-10-CM | POA: Diagnosis not present

## 2021-05-17 DIAGNOSIS — E1122 Type 2 diabetes mellitus with diabetic chronic kidney disease: Secondary | ICD-10-CM | POA: Diagnosis not present

## 2021-05-17 DIAGNOSIS — Z8541 Personal history of malignant neoplasm of cervix uteri: Secondary | ICD-10-CM | POA: Insufficient documentation

## 2021-05-17 DIAGNOSIS — I2510 Atherosclerotic heart disease of native coronary artery without angina pectoris: Secondary | ICD-10-CM | POA: Diagnosis not present

## 2021-05-17 DIAGNOSIS — Z992 Dependence on renal dialysis: Secondary | ICD-10-CM | POA: Insufficient documentation

## 2021-05-17 DIAGNOSIS — I5033 Acute on chronic diastolic (congestive) heart failure: Secondary | ICD-10-CM | POA: Insufficient documentation

## 2021-05-17 DIAGNOSIS — N186 End stage renal disease: Secondary | ICD-10-CM | POA: Insufficient documentation

## 2021-05-17 DIAGNOSIS — R109 Unspecified abdominal pain: Secondary | ICD-10-CM | POA: Diagnosis not present

## 2021-05-17 DIAGNOSIS — I132 Hypertensive heart and chronic kidney disease with heart failure and with stage 5 chronic kidney disease, or end stage renal disease: Secondary | ICD-10-CM | POA: Insufficient documentation

## 2021-05-17 DIAGNOSIS — J449 Chronic obstructive pulmonary disease, unspecified: Secondary | ICD-10-CM | POA: Diagnosis not present

## 2021-05-17 DIAGNOSIS — Z20822 Contact with and (suspected) exposure to covid-19: Secondary | ICD-10-CM | POA: Insufficient documentation

## 2021-05-17 DIAGNOSIS — E1140 Type 2 diabetes mellitus with diabetic neuropathy, unspecified: Secondary | ICD-10-CM | POA: Insufficient documentation

## 2021-05-17 DIAGNOSIS — Z8543 Personal history of malignant neoplasm of ovary: Secondary | ICD-10-CM | POA: Insufficient documentation

## 2021-05-17 DIAGNOSIS — R0602 Shortness of breath: Secondary | ICD-10-CM | POA: Diagnosis not present

## 2021-05-17 DIAGNOSIS — I959 Hypotension, unspecified: Secondary | ICD-10-CM | POA: Diagnosis not present

## 2021-05-17 DIAGNOSIS — R531 Weakness: Secondary | ICD-10-CM | POA: Diagnosis present

## 2021-05-17 DIAGNOSIS — D631 Anemia in chronic kidney disease: Secondary | ICD-10-CM | POA: Insufficient documentation

## 2021-05-17 LAB — CBC WITH DIFFERENTIAL/PLATELET
Abs Immature Granulocytes: 0.34 10*3/uL — ABNORMAL HIGH (ref 0.00–0.07)
Basophils Absolute: 0.1 10*3/uL (ref 0.0–0.1)
Basophils Relative: 0 %
Eosinophils Absolute: 0 10*3/uL (ref 0.0–0.5)
Eosinophils Relative: 0 %
HCT: 29 % — ABNORMAL LOW (ref 36.0–46.0)
Hemoglobin: 9.1 g/dL — ABNORMAL LOW (ref 12.0–15.0)
Immature Granulocytes: 2 %
Lymphocytes Relative: 7 %
Lymphs Abs: 1 10*3/uL (ref 0.7–4.0)
MCH: 29.5 pg (ref 26.0–34.0)
MCHC: 31.4 g/dL (ref 30.0–36.0)
MCV: 94.2 fL (ref 80.0–100.0)
Monocytes Absolute: 1 10*3/uL (ref 0.1–1.0)
Monocytes Relative: 7 %
Neutro Abs: 11.5 10*3/uL — ABNORMAL HIGH (ref 1.7–7.7)
Neutrophils Relative %: 84 %
Platelets: 194 10*3/uL (ref 150–400)
RBC: 3.08 MIL/uL — ABNORMAL LOW (ref 3.87–5.11)
RDW: 17.6 % — ABNORMAL HIGH (ref 11.5–15.5)
WBC: 13.9 10*3/uL — ABNORMAL HIGH (ref 4.0–10.5)
nRBC: 1.4 % — ABNORMAL HIGH (ref 0.0–0.2)

## 2021-05-17 LAB — COMPREHENSIVE METABOLIC PANEL
ALT: 11 U/L (ref 0–44)
AST: 18 U/L (ref 15–41)
Albumin: 1.8 g/dL — ABNORMAL LOW (ref 3.5–5.0)
Alkaline Phosphatase: 100 U/L (ref 38–126)
Anion gap: 9 (ref 5–15)
BUN: 20 mg/dL (ref 8–23)
CO2: 26 mmol/L (ref 22–32)
Calcium: 8.4 mg/dL — ABNORMAL LOW (ref 8.9–10.3)
Chloride: 98 mmol/L (ref 98–111)
Creatinine, Ser: 3.18 mg/dL — ABNORMAL HIGH (ref 0.44–1.00)
GFR, Estimated: 15 mL/min — ABNORMAL LOW (ref >60)
Glucose, Bld: 86 mg/dL (ref 70–99)
Potassium: 3.2 mmol/L — ABNORMAL LOW (ref 3.5–5.1)
Sodium: 133 mmol/L — ABNORMAL LOW (ref 135–145)
Total Bilirubin: 1 mg/dL (ref 0.3–1.2)
Total Protein: 5.2 g/dL — ABNORMAL LOW (ref 6.5–8.1)

## 2021-05-17 LAB — RESP PANEL BY RT-PCR (FLU A&B, COVID) ARPGX2
Influenza A by PCR: NEGATIVE
Influenza B by PCR: NEGATIVE
SARS Coronavirus 2 by RT PCR: NEGATIVE

## 2021-05-17 LAB — LIPASE, BLOOD: Lipase: 23 U/L (ref 11–51)

## 2021-05-17 LAB — TROPONIN I (HIGH SENSITIVITY)
Troponin I (High Sensitivity): 16 ng/L (ref <18)
Troponin I (High Sensitivity): 15 ng/L (ref <18)

## 2021-05-17 MED ORDER — SODIUM CHLORIDE 0.9 % IV BOLUS
250.0000 mL | Freq: Once | INTRAVENOUS | Status: AC
  Administered 2021-05-17: 250 mL via INTRAVENOUS

## 2021-05-17 NOTE — ED Notes (Signed)
IV removed, warm packs paced to affected area.

## 2021-05-17 NOTE — Discharge Instructions (Signed)
You were seen in the emergency department today with low blood pressures during dialysis.  Your lab work and CT scans are within normal limits.  We started an IV in your right arm but the IV infiltrated causing some localized swelling.  You can apply a warm compress to this area to help with swelling.  Return to the emergency department any new or suddenly worsening symptoms.  Please continue going to your dialysis sessions.

## 2021-05-17 NOTE — ED Triage Notes (Addendum)
Pt brought to ED via [facility] from dialysis for hypotension. BP systolic 70, hr 834'H per dialysis. Pt with significant swelling to left arm chronically. Pt has been on 3L O2 for the past couple of weeks. Pt from [facility]. Received 1/3 of dialysis treatment.

## 2021-05-17 NOTE — ED Provider Notes (Signed)
Emergency Department Provider Note   I have reviewed the triage vital signs and the nursing notes.   HISTORY  Chief Complaint Hypotension   HPI [name] is a 75 y.o. female with complicated past medical history including end-stage renal disease, CHF, CAD, COPD, diabetes presents to the emergency department by EMS from dialysis.  Patient was about halfway through her dialysis session when she began complaining of abdominal discomfort.  Staff called EMS who reported initial blood pressure of 70/palp and tachycardia.  No interventions in route to the emergency department.  Patient tells me she is been feeling weakness for a while but that her abdominal pain began during her dialysis session today.  She is not having chest pain.  She is feeling some mild shortness of breath.  She has difficulty describing her abdominal discomfort.  Denies any radiation or modifying factors.     Past Medical History:  Diagnosis Date   Blood transfusion without reported diagnosis    CAD (coronary artery disease)    Nonobstructive at cardiac catheterization 2000   Cataract    Cervical cancer ([place]) 1978   CHF (congestive heart failure) (HCC)    Chronic back pain    Chronic kidney disease    Class 2 obesity with body mass index (BMI) of 35 to 39.9 without comorbidity    Complication of anesthesia    hard to awaken with one back surgery - years ago, no problem since   COPD (chronic obstructive pulmonary disease) ([place])    Degenerative disc disease    DM (diabetes mellitus), type 2 with renal complications ([place])    Dyslipidemia    Dyspnea    "due to COPD"   Dysrhythmia    a-fib   ESRD (end stage renal disease) on dialysis ([place])    TTHS- [address]   GERD (gastroesophageal reflux disease)    Gout    Hiatal hernia 07/27/2013   History of diverticulitis of colon    History of hiatal hernia    HTN (hypertension)    09/09/20 not currently on medication, is on medication for hypotension    Hypotension    Hypothyroidism, unspecified 04/23/2021   Iron deficiency anemia    Irritable bowel syndrome    Lumbar radiculopathy    Mixed hyperlipidemia    Moderate major depression, single episode ([place]) 07/21/2019   Neuropathy    OSA (obstructive sleep apnea)    Osteoporosis    Ovarian cancer ([place]) 1978   patient denies. States this was cervical cancer   Oxygen deficiency    room air   PAF (paroxysmal atrial fibrillation) (HCC)    Pneumonia    Sleep apnea    bipap - oxygen 2 l to bipap.   Type 2 diabetes mellitus ([place])    Vitamin B deficiency 12/25/2009   Vitamin B12 deficiency     Patient Active Problem List   Diagnosis Date Noted   Edema of left upper arm 05/08/2021   Generalized weakness 05/08/2021   Hypothyroidism, unspecified 04/23/2021   Diverticulitis    Non-intractable vomiting    Anticoagulated 03/05/2021   GI bleed 02/25/2021   Acute blood loss anemia 02/25/2021   Hypotension 02/25/2021   Presence of cardiac and vascular implant and graft, unspecified 11/14/2020   Diverticulitis of colon without hemorrhage 09/08/2020   Other disorders of phosphorus metabolism 08/31/2020   Acute respiratory failure due to COVID-19 [facility]) 07/20/2020   Acute respiratory failure with hypoxia ([place]) 07/20/2020   Allergy, unspecified, sequela 06/28/2020  Personal history of anaphylaxis 06/28/2020   Recurrent falls 04/25/2020   Fall 04/24/2020   ESRD (end stage renal disease) on dialysis [facility])    Essential hypertension    Dyslipidemia    DM (diabetes mellitus), type 2 with renal complications (HCC)    Class 2 obesity with body mass index (BMI) of 35 to 39.9 without comorbidity    OSA (obstructive sleep apnea)    COPD (chronic obstructive pulmonary disease) (HCC)    PAF (paroxysmal atrial fibrillation) ([place])    Acute respiratory distress 03/09/2020   Fluid overload, unspecified 03/09/2020   Hyperkalemia 03/06/2020   GERD with esophagitis 03/06/2020   Acute bacterial  bronchitis 03/06/2020   Anemia in chronic kidney disease 11/17/2019   Coagulation defect, unspecified ([place]) 11/17/2019   Pain, unspecified 11/17/2019   Pruritus, unspecified 11/17/2019   Secondary hyperparathyroidism of renal origin ([place]) 11/17/2019   Unspecified protein-calorie malnutrition ([place]) 11/17/2019   Loss of weight 10/22/2019   Goals of care, counseling/discussion 07/23/2019   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 07/21/2019   Paroxysmal atrial fibrillation ([place]) 07/21/2019   Encephalopathy 07/21/2019   Moderate major depression, single episode ([place]) 07/21/2019   Pressure ulcer 07/20/2019   Anorexia 07/10/2019   Weight gain with edema 07/02/2019   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 ([place]) 06/22/2019   Neuropathy due to type 2 diabetes mellitus ([place]) 06/22/2019   Mixed diabetic hyperlipidemia associated with type 2 diabetes mellitus ([place]) 06/22/2019   Chronic gout 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 06/22/2019   Quality of life palliative care encounter 06/19/2019   Atrial fibrillation with RVR (HCC)    Atrial flutter with rapid ventricular response ([place]) 06/16/2019   Dyspnea and respiratory abnormalities 06/01/2019   Acute on chronic respiratory failure with hypoxia and hypercapnia with respiratory acidosis 06/01/2019   Anasarca 05/31/2019   Nausea 05/31/2019   Generalized abdominal pain 05/31/2019   History of colonic polyps 05/31/2019   Lymphedema of both lower extremities 04/28/2019   Cellulitis of finger of right hand 08/19/2018   Chronic respiratory failure with hypoxia ([place]) 07/13/2018   Acute renal failure superimposed on stage 3 chronic kidney disease ([place]) 07/13/2018   Chronic diastolic CHF (congestive heart failure) ([place]) 07/13/2018   COPD (chronic obstructive pulmonary disease) ([place]) 07/13/2018   Flatulence 09/18/2017   IBS (irritable bowel syndrome) 04/03/2017   Acute on  chronic diastolic CHF (congestive heart failure) ([place]) 05/08/2016   COPD with acute exacerbation ([place]) 05/08/2016   Constipation 04/25/2016   Peripheral vertigo 06/27/2015   Intractable nausea and vomiting 03/29/2015   Diarrhea, unspecified 12/14/2014   Port-A-Cath in place 10/06/2014   Chronic obstructive pulmonary disease ([place]) 04/30/2014   Obesity, Class III, BMI 40-49.9 (morbid obesity) ([place]) 04/30/2014   Hemorrhoids, internal, with bleeding 11/10/2013   Symptomatic anemia 08/18/2013   Other diseases of tongue 08/18/2013   Dark stools 07/27/2013   Chest pain 09/05/2012   Arthropathy 12/09/2011   Edema 12/09/2011   Gout 12/09/2011   Hiatal hernia with GERD and esophagitis    Obstructive chronic bronchitis without exacerbation ([place]) 12/26/2009   Hematochezia 12/26/2009   Dysphagia 12/26/2009   DM type 2 with diabetic peripheral neuropathy ([place]) 12/25/2009   Vitamin B deficiency 12/25/2009   Iron deficiency anemia due to chronic blood loss 12/25/2009   Benign essential HTN 12/25/2009   DEGENERATIVE [place] DISEASE 12/25/2009   OSA (obstructive sleep apnea) 12/25/2009   HLD (hyperlipidemia) 12/25/2009   Type 2 diabetes  mellitus with ESRD (end-stage renal disease) ([place]) 12/25/2009   Narrowing of intervertebral disc space 12/25/2009    Past Surgical History:  Procedure Laterality Date   ABDOMINAL HYSTERECTOMY     APPLICATION OF WOUND VAC Right 01/15/2021   Procedure: APPLICATION OF WOUND VAC;  Surgeon: Cherre Robins, MD;  Location: [place];  Service: Vascular;  Laterality: Right;   AV FISTULA PLACEMENT Left 08/05/2019   Procedure: ARTERIOVENOUS (AV) FISTULA CREATION LEFT ARM;  Surgeon: Waynetta Sandy, MD;  Location: [place];  Service: Vascular;  Laterality: Left;   AV FISTULA PLACEMENT Left 09/11/2020   Procedure: INSERTION OF ARTERIOVENOUS (AV) GORE-TEX GRAFT ARM LEFT;  Surgeon: Cherre Robins, MD;  Location: [place];  Service: Vascular;  Laterality: Left;   AV FISTULA  PLACEMENT Right 11/29/2020   Procedure: Creation of Arteriovenous Fistula Right Upper Arm;  Surgeon: Rosetta Posner, MD;  Location: [place];  Service: Vascular;  Laterality: Right;   [place] Left 10/05/2019   Procedure: SECOND STAGE LEFT BASCILIC VEIN TRANSPOSITION;  Surgeon: Waynetta Sandy, MD;  Location: [place];  Service: Vascular;  Laterality: Left;   [place] Right 01/15/2021   Procedure: RIGHT UPPER ARM SECOND STAGE [place];  Surgeon: Cherre Robins, MD;  Location: [place];  Service: Vascular;  Laterality: Right;  PERIPHERAL NERVE BLOCK   Benign breast cysts     BIOPSY  02/26/2021   Procedure: BIOPSY;  Surgeon: Jackquline Denmark, MD;  Location: [facility] ENDOSCOPY;  Service: Endoscopy;;   CHOLECYSTECTOMY     COLONOSCOPY  10/01/2006   SLF:Pan colonic diverticulosis and moderate internal hemorrhoids/ Otherwise no polyps, masses, inflammatory changes or AVMs/   COLONOSCOPY  2011   SLF: pancolonic diverticulosis, large internal hemorrhoids   COLONOSCOPY N/A 01/26/2016   Procedure: COLONOSCOPY;  Surgeon: [name], MD;  [facility];  Service: Endoscopy;  Laterality: N/A;  830    COLONOSCOPY WITH PROPOFOL N/A 02/10/2020   Procedure: COLONOSCOPY WITH PROPOFOL;  Surgeon: Daneil Dolin, MD;  [facility];  Service: Endoscopy;  Laterality: N/A;  10:45am   ESOPHAGEAL DILATION  02/26/2021   Procedure: ESOPHAGEAL DILATION;  Surgeon: Jackquline Denmark, MD;  Location: [facility] ENDOSCOPY;  Service: Endoscopy;;   ESOPHAGOGASTRODUODENOSCOPY  11/19/2006   SLF: Large hiatal hernia without evidence of Cameron ulcers/. Distal esophageal stricture, which allowed the gastroscope to pass without resistance.  A 16 mm Savary later passed with mild resistance/ Normal stomach.sb bx negative   ESOPHAGOGASTRODUODENOSCOPY  10/01/2006   LFY:BOFBP hiatal hernia.  Distal esophagus without evidence of   erythema, ulceration or Barrett's  esophagus   ESOPHAGOGASTRODUODENOSCOPY  2011   SLF: large hh, distal esophageal web narrowing to 36mm s/p dilation to 71mm   ESOPHAGOGASTRODUODENOSCOPY N/A 08/06/2013   SLF: 1. Stricture at the gastroesophageal junction 2. large hiatal hernia. 3. Mild erosive gastritis.   ESOPHAGOGASTRODUODENOSCOPY (EGD) WITH PROPOFOL N/A 07/19/2019   rourk: Mild erosive reflux esophagitis.  Mild Schatzki ring status post dilation.  Large hiatal hernia with at least one half of the stomach above the diaphragm.  Gastric mucosa erythematous.   ESOPHAGOGASTRODUODENOSCOPY (EGD) WITH PROPOFOL N/A 02/26/2021   Procedure: ESOPHAGOGASTRODUODENOSCOPY (EGD) WITH PROPOFOL;  Surgeon: Jackquline Denmark, MD;  Location[facility] ENDOSCOPY;  Service: Endoscopy;  Laterality: N/A;   GIVENS CAPSULE STUDY N/A 08/06/2013   INCOMPLETE-SMALL BOWLE ULCERS   IR FLUORO GUIDE CV LINE LEFT  07/29/2019   IR US GUIDE VASC ACCESS LEFT  07/29/2019   KNEE SURGERY Right    PARTIAL HYSTERECTOMY  1978   PORT-A-CATH REMOVAL Right 11/29/2020   Procedure: REMOVAL OF RIGHT CHEST PORT;  Surgeon: Rosetta Posner, MD;  Location: [place];  Service: Vascular;  Laterality: Right;   small bowel capsule  2008   negative   SPINE SURGERY     TONSILLECTOMY AND ADENOIDECTOMY     Two back surgeries/fusion     UMBILICAL HERNIA REPAIR  2010   UPPER EXTREMITY VENOGRAPHY N/A 11/22/2020   Procedure: UPPER EXTREMITY VENOGRAPHY;  Surgeon: Cherre Robins, MD;  Location: [place] CV LAB;  Service: Cardiovascular;  Laterality: N/A;    Allergies Patient has no known allergies.  Family History  Problem Relation Age of Onset   Colon cancer Brother        diagnosed age 27. Living.    Ulcers Sister    Diabetes Sister    Heart attack Sister    Kidney failure Sister    Stroke Sister    Ulcers Mother    Diabetes Mother    Heart attack Mother    Stroke Mother    Asthma Mother    Heart disease Mother    Cervical cancer Mother    Heart attack Brother    Heart disease Brother     Asthma Sister    Diabetes Brother    Stroke Maternal Grandmother    Heart attack Maternal Grandmother    Heart attack Other    Early death Father        MVA in his 98s    Social History Social History   Tobacco Use   Smoking status: Former    Packs/day: 1.00    Years: 1.00    Pack years: 1.00    Types: Cigarettes    Start date: 02/19/1961    Quit date: 08/05/1961    Years since quitting: 59.8   Smokeless tobacco: Never   Tobacco comments:    1 year in her lifetime  Vaping Use   Vaping Use: Never used  Substance Use Topics   Alcohol use: Never   Drug use: Never    Review of Systems  Constitutional: No fever/chills. Positive generalized weakness.  Eyes: No visual changes. ENT: No sore throat. Cardiovascular: Denies chest pain. Positive hypotension at HD.  Respiratory: Denies shortness of breath. Gastrointestinal: Positive abdominal pain.  No nausea, no vomiting.  No diarrhea.  No constipation. Genitourinary: Negative for dysuria. Musculoskeletal: Negative for back pain. Skin: Negative for rash. Neurological: Negative for headaches, focal weakness or numbness.  10-point ROS otherwise negative.  ____________________________________________   PHYSICAL EXAM:  VITAL SIGNS: ED Triage Vitals  Enc Vitals Group     BP 05/17/21 1514 (!) 120/106     Pulse Rate 05/17/21 1514 82     Resp 05/17/21 1514 (!) 24     Temp 05/17/21 1514 98.4 F (36.9 C)     Temp Source 05/17/21 1514 Oral     SpO2 05/17/21 1514 98 %     Weight 05/17/21 1508 148 lb (67.1 kg)     Height 05/17/21 1508 4\' 8"  (1.422 m)    Constitutional: Alert but groaning. She appears deconditioned and chronically ill.  Eyes: Conjunctivae are normal.  Head: Atraumatic. Nose: No congestion/rhinnorhea. Mouth/Throat: Mucous membranes are slightly dry.  Neck: No stridor.   Cardiovascular: Normal rate, regular rhythm. Good peripheral circulation. Grossly normal heart sounds.   Respiratory: Normal respiratory  effort.  No retractions. Lungs CTAB. Gastrointestinal: Soft with mild  diffuse tenderness. No rebound or guarding. No distention.  Musculoskeletal: Patient with diffuse edema in the bilateral upper/lower extremities.  Neurologic:  Normal speech and language.  Skin:  Skin is warm, dry and intact. No rash noted.   ____________________________________________   LABS (all labs ordered are listed, but only abnormal results are displayed)  Labs Reviewed  COMPREHENSIVE METABOLIC PANEL - Abnormal; Notable for the following components:      Result Value   Sodium 133 (*)    Potassium 3.2 (*)    Creatinine, Ser 3.18 (*)    Calcium 8.4 (*)    Total Protein 5.2 (*)    Albumin 1.8 (*)    GFR, Estimated 15 (*)    All other components within normal limits  CBC WITH DIFFERENTIAL/PLATELET - Abnormal; Notable for the following components:   WBC 13.9 (*)    RBC 3.08 (*)    Hemoglobin 9.1 (*)    HCT 29.0 (*)    RDW 17.6 (*)    nRBC 1.4 (*)    Neutro Abs 11.5 (*)    Abs Immature Granulocytes 0.34 (*)    All other components within normal limits  RESP PANEL BY RT-PCR (FLU A&B, COVID) ARPGX2  LIPASE, BLOOD  TROPONIN I (HIGH SENSITIVITY)  TROPONIN I (HIGH SENSITIVITY)   ____________________________________________  EKG   EKG Interpretation  Date/Time:    Ventricular Rate:    PR Interval:    QRS Duration:   QT Interval:    QTC Calculation:   R Axis:     Text Interpretation:          ____________________________________________  RADIOLOGY  CT ABDOMEN PELVIS WO CONTRAST  Result Date: 05/17/2021 CLINICAL DATA:  Abdominal pain.  Nonlocalized. EXAM: CT ABDOMEN AND PELVIS WITHOUT CONTRAST TECHNIQUE: Multidetector CT imaging of the abdomen and pelvis was performed following the standard protocol without IV contrast. COMPARISON:  04/18/2021 FINDINGS: Lower chest: Large hiatal hernia. There is bibasilar atelectasis, similar to prior. Hepatobiliary: No focal abnormality in the liver on  this study without intravenous contrast. Gallbladder surgically absent. No intrahepatic or extrahepatic biliary dilation. Pancreas: No focal mass lesion. No dilatation of the main duct. No intraparenchymal cyst. No peripancreatic edema. Spleen: No splenomegaly. No focal mass lesion. Adrenals/Urinary Tract: No adrenal nodule or mass. Kidneys unremarkable. No evidence for hydroureter. Bladder is decompressed. Stomach/Bowel: Large hiatal hernia with nearly the entire stomach contained in the chest. Duodenum is normally positioned as is the ligament of Treitz. No small bowel wall thickening. No small bowel dilatation. The terminal ileum is normal. E The appendix is normal. Diverticuli are seen scattered along the entire length of the colon without CT findings of diverticulitis. There is some asymmetric wall thickening in the descending colon (39/2) and in the proximal sigmoid colon (51/2). Vascular/Lymphatic: There is moderate atherosclerotic calcification of the abdominal aorta without aneurysm. There is no gastrohepatic or hepatoduodenal ligament lymphadenopathy. No retroperitoneal or mesenteric lymphadenopathy. No pelvic sidewall lymphadenopathy. Reproductive: Hysterectomy.  There is no adnexal mass. Other: No intraperitoneal free fluid. Musculoskeletal: Bones are diffusely demineralized. Lumbar fusion hardware evident. No worrisome lytic or sclerotic osseous abnormality. L1 compression fracture is stable in the interval. Diffuse soft tissue edema noted in the abdomen and pelvis, left greater than right. Given the asymmetry, correlation for potential cellulitis recommended. Gas bubbles in the right paramidline anterior abdominal wall likely related to an injection site. IMPRESSION: 1. Body wall edema in the abdomen and upper pelvis is asymmetric to the left. As such, correlation for cellulitis or  hemorrhage suggested. 2. Asymmetric wall thickening in the descending and proximal sigmoid colon. While this may be  related to underdistention, correlation with colorectal cancer screening history recommended. Infectious/inflammatory colitis is considered unlikely but not entirely excluded. 3. Large hiatal hernia with nearly the entire stomach contained in the chest. 4. Diffuse colonic diverticulosis without diverticulitis. 5. Stable L1 compression fracture. 6. Aortic Atherosclerosis (ICD10-I70.0). Electronically Signed   By: Misty Stanley M.D.   On: 05/17/2021 17:25   DG Chest Portable 1 View  Result Date: 05/17/2021 CLINICAL DATA:  Weakness EXAM: PORTABLE CHEST 1 VIEW COMPARISON:  04/13/2021 FINDINGS: Cardiomegaly. Large hiatal hernia. New small left pleural effusion. Large-bore left neck multi lumen vascular catheter. IMPRESSION: 1.  New small left pleural effusion. 2.  Cardiomegaly. 3.  Large hiatal hernia. Electronically Signed   By: Delanna Ahmadi M.D.   On: 05/17/2021 16:20    ____________________________________________   PROCEDURES  Procedure(s) performed:   Procedures  CRITICAL CARE Performed by: Margette Fast Total critical care time: 35 minutes Critical care time was exclusive of separately billable procedures and treating other patients. Critical care was necessary to treat or prevent imminent or life-threatening deterioration. Critical care was time spent personally by me on the following activities: development of treatment plan with patient and/or surrogate as well as nursing, discussions with consultants, evaluation of patient's response to treatment, examination of patient, obtaining history from patient or surrogate, ordering and performing treatments and interventions, ordering and review of laboratory studies, ordering and review of radiographic studies, pulse oximetry and re-evaluation of patient's condition.  Nanda Quinton, MD Emergency Medicine   Emergency Ultrasound Study:   Angiocath insertion Performed by: Margette Fast  Consent: Verbal consent obtained. Risks and benefits:  risks, benefits and alternatives were discussed Immediately prior to procedure the correct patient, procedure, equipment, support staff and site/side marked as needed.  Indication: difficult IV access Preparation: Patient was prepped and draped in the usual sterile fashion. Vein Location: Right AC was visualized during assessment for potential access sites and was found to be patent/ easily compressed with linear ultrasound.  The needle was visualized with real-time ultrasound and guided into the vein. Gauge: 20  Image saved and stored.  Normal blood return.  Patient tolerance: Patient tolerated the procedure well with no immediate complications.    ____________________________________________   INITIAL IMPRESSION / ASSESSMENT AND PLAN / ED COURSE  Pertinent labs & imaging results that were available during my care of the patient were reviewed by me and considered in my medical decision making (see chart for details).   Patient presents emergency department with generalized weakness and abdominal pain which began at dialysis.  EMS reports low blood pressure initially but here patient with blood pressure in the 093A systolic.  No tachycardia.  No hypoxemia.  Patient does appear deconditioned and chronically ill but in review of her last admit/discharge summary this appears more of a chronic issue for her.  Patient does have recurrent diverticulitis along with prior gastritis, large hiatal hernia.  She will likely require CT imaging as this has been noted to be higher risk for volvulus.   06:27 PM  Patient's lab work here is reassuring.  She has mild leukocytosis but this is actually downtrending from her prior values.  She is not severely anemic compared to her baseline values.  I do not see an indication for emergent dialysis based on her chemistries.  Her COVID and flu are negative.  Her blood pressures have been within normal limits  since arrival.  Suspect transient hypotension with  dialysis.  Question if she may have some component of peripheral artery disease in her mesentery and with drop in blood pressure caused abdominal discomfort.  Her CT scan is consistent with her priors.  Body wall edema found on CT does not correlate with area of cellulitis.  She has worsening swelling on the left, likely due to HD catheter placement in the left chest.  In discussion with the patient she states she is feeling much better.  Her abdominal pain is resolved.  She is not having vomiting.  She has not confused. She feels comfortable with the plan to return to her rehab facility.  Her IV in the right AC did infiltrate here causing some localized swelling.  Fluids were stopped and will apply warm compress but did not infiltrate contrast or other media. Discussed with patient and family at bedside. Stable for discharge back to facility.   Spoke with her son [name] by phone to review the case.  I discussed that the patient is medically fragile and at higher risk for ED return/admit. She is admitted frequently. Staff can monitor at the facility and continue physical therapy to help the patient gain strength and mobility. ____________________________________________  FINAL CLINICAL IMPRESSION(S) / ED DIAGNOSES  Final diagnoses:  Transient hypotension     MEDICATIONS GIVEN DURING THIS VISIT:  Medications  sodium chloride 0.9 % bolus 250 mL (250 mLs Intravenous New Bag/Given 05/17/21 1800)     Note:  This document was prepared using Dragon voice recognition software and may include unintentional dictation errors.  Nanda Quinton, MD, [facility] Emergency Medicine    Shanitha Twining, [facility], MD 05/17/21 Sharilyn Sites

## 2021-05-17 NOTE — Progress Notes (Deleted)
Cardiology Office Note  Date: 05/17/2021   ID: [name] 07/05/1946, MRN [id number]  PCP:  Rita Rio, MD  Cardiologist:  [name], MD Electrophysiologist:  None   Chief Complaint: ED follow-up  History of Present Illness: [name] is a 75 y.o. female with a history of  history of coronary artery disease nonobstructive by cath in 2000, CHF, chronic kidney disease, hypertension, GERD, iron deficiency anemia, IBS, hyperlipidemia, type 2 diabetes, COPD, history of diastolic dysfunction in 3154 by echo.  ESRD.   Recent presentation to [facility], ED from dialysis.  On 05/17/2021.  She was about halfway through her dialysis session when she began having abdominal discomfort.  Staff called EMS who reported systolic blood pressure of 70 by palpation tachycardia.  He was not having chest pain she was feeling some mild shortness of breath.  She had difficulty describing her abdominal discomfort.  Denies any radiation or modifying factors.  She had a CT of the abdomen and pelvis without contrast on 05/17/2021 demonstrating body wall edema in the abdomen or pelvis asymmetric to the left and a slight correlation for cellulitis suggested.  Asymmetric wall thickening of the descending proximal sigmoid colon.  This may be related to underdistention, correlation with colorectal cancer screening history recommended.  Infectious/inflammatory colitis is considered unlikely but not in fluid.  Large hiatal hernia with nearly entire stomach contained in the chest.  Diffuse colonic diverticulosis without diverticulitis.  Stable L1 compression fracture, aortic atherosclerosis.  Portable chest x-ray with cardiomegaly, large hernial hernia, new small left pleural effusion, large bore left neck multilumen vascular catheter.      Past Medical History:  Diagnosis Date   Blood transfusion without reported diagnosis    CAD (coronary artery disease)    Nonobstructive at cardiac  catheterization 2000   Cataract    Cervical cancer ([place]) 1978   CHF (congestive heart failure) (HCC)    Chronic back pain    Chronic kidney disease    Class 2 obesity with body mass index (BMI) of 35 to 39.9 without comorbidity    Complication of anesthesia    hard to awaken with one back surgery - years ago, no problem since   COPD (chronic obstructive pulmonary disease) ([place])    Degenerative disc disease    DM (diabetes mellitus), type 2 with renal complications ([place])    Dyslipidemia    Dyspnea    "due to COPD"   Dysrhythmia    a-fib   ESRD (end stage renal disease) on dialysis ([place])    [facility]- [address]   GERD (gastroesophageal reflux disease)    Gout    Hiatal hernia 07/27/2013   History of diverticulitis of colon    History of hiatal hernia    HTN (hypertension)    09/09/20 not currently on medication, is on medication for hypotension   Hypotension    Hypothyroidism, unspecified 04/23/2021   Iron deficiency anemia    Irritable bowel syndrome    Lumbar radiculopathy    Mixed hyperlipidemia    Moderate major depression, 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 States this was cervical cancer   Oxygen deficiency    room air   PAF (paroxysmal atrial fibrillation) (HCC)    Pneumonia    Sleep apnea    bipap - oxygen 2 l to bipap.  Type 2 diabetes mellitus ([place])    Vitamin B deficiency 12/25/2009   Vitamin B12 deficiency     Past Surgical History:  Procedure Laterality Date   ABDOMINAL HYSTERECTOMY     APPLICATION OF WOUND VAC Right 01/15/2021   Procedure: APPLICATION OF WOUND VAC;  Surgeon: Cherre Robins, MD;  Location: [place];  Service: Vascular;  Laterality: Right;   AV FISTULA PLACEMENT Left 08/05/2019   Procedure: ARTERIOVENOUS (AV) FISTULA CREATION LEFT ARM;  Surgeon: Waynetta Sandy, MD;  Location: [place];  Service: Vascular;  Laterality: Left;    AV FISTULA PLACEMENT Left 09/11/2020   Procedure: INSERTION OF ARTERIOVENOUS (AV) GORE-TEX GRAFT ARM LEFT;  Surgeon: Cherre Robins, MD;  Location: [place];  Service: Vascular;  Laterality: Left;   AV FISTULA PLACEMENT Right 11/29/2020   Procedure: Creation of Arteriovenous Fistula Right Upper Arm;  Surgeon: Rosetta Posner, MD;  Location: [place];  Service: Vascular;  Laterality: Right;   [place] Left 10/05/2019   Procedure: SECOND STAGE LEFT BASCILIC VEIN TRANSPOSITION;  Surgeon: Waynetta Sandy, MD;  Location: [place];  Service: Vascular;  Laterality: Left;   [place] Right 01/15/2021   Procedure: RIGHT UPPER ARM SECOND STAGE [place];  Surgeon: Cherre Robins, MD;  Location: [place];  Service: Vascular;  Laterality: Right;  PERIPHERAL NERVE BLOCK   Benign breast cysts     BIOPSY  02/26/2021   Procedure: BIOPSY;  Surgeon: Jackquline Denmark, MD;  Location: [facility] ENDOSCOPY;  Service: Endoscopy;;   CHOLECYSTECTOMY     COLONOSCOPY  10/01/2006   SLF:Pan colonic diverticulosis and moderate internal hemorrhoids/ Otherwise no polyps, masses, inflammatory changes or AVMs/   COLONOSCOPY  2011   SLF: pancolonic diverticulosis, large internal hemorrhoids   COLONOSCOPY N/A 01/26/2016   Procedure: COLONOSCOPY;  Surgeon: [name], MD;  [facility];  Service: Endoscopy;  Laterality: N/A;  830    COLONOSCOPY WITH PROPOFOL N/A 02/10/2020   Procedure: COLONOSCOPY WITH PROPOFOL;  Surgeon: Daneil Dolin, MD;  [facility];  Service: Endoscopy;  Laterality: N/A;  10:45am   ESOPHAGEAL DILATION  02/26/2021   Procedure: ESOPHAGEAL DILATION;  Surgeon: Jackquline Denmark, MD;  Location: [facility]COPY;  Service: Endoscopy;;   ESOPHAGOGASTRODUODENOSCOPY  11/19/2006   SLF: Large hiatal hernia without evidence of [place] ulcers/. Distal esophageal stricture, which allowed the gastroscope to pass without resistance.  A 16 mm Sav[place]  later passed with mild resistance/ Normal stomach.sb bx negative   ESOPHAGOGASTRODUODENOSCOPY  10/01/2006   PZW:CHENI hiatal hernia.  Distal esophagus without evidence of   erythema, ulceration or Barrett's esophagus   ESOPHAGOGASTRODUODENOSCOPY  2011   SLF: large hh, distal esophageal web narrowing to 1mm s/p dilation to 56mm   ESOPHAGOGASTRODUODENOSCOPY N/A 08/06/2013   SLF: 1. Stricture at the gastroesophageal junction 2. large hiatal hernia. 3. Mild erosive gastritis.   ESOPHAGOGASTRODUODENOSCOPY (EGD) WITH PROPOFOL N/A 07/19/2019   rourk: Mild erosive reflux esophagitis.  Mild Schatzki ring status post dilation.  Large hiatal hernia with at least one half of the stomach above the diaphragm.  Gastric mucosa erythematous.   ESOPHAGOGASTRODUODENOSCOPY (EGD) WITH PROPOFOL N/A 02/26/2021   Procedure: ESOPHAGOGASTRODUODENOSCOPY (EGD) WITH PROPOFOL;  Surgeon: Jackquline Denmark, MD;  Location: [facility] ENDOSCOPY;  Service: Endoscopy;  Laterality: N/A;   GIVENS CAPSULE STUDY N/A 08/06/2013   INCOMPLETE-SMALL BOWLE ULCERS   IR FLUORO GUIDE CV LINE LEFT  07/29/2019   IR US GUIDE VASC ACCESS  LEFT  07/29/2019   KNEE SURGERY Right    PARTIAL HYSTERECTOMY  1978   PORT-A-CATH REMOVAL Right 11/29/2020   Procedure: REMOVAL OF RIGHT CHEST PORT;  Surgeon: Rosetta Posner, MD;  Location: [place];  Service: Vascular;  Laterality: Right;   small bowel capsule  2008   negative   SPINE SURGERY     TONSILLECTOMY AND ADENOIDECTOMY     Two back surgeries/fusion     UMBILICAL HERNIA REPAIR  2010   UPPER EXTREMITY VENOGRAPHY N/A 11/22/2020   Procedure: UPPER EXTREMITY VENOGRAPHY;  Surgeon: Cherre Robins, MD;  Location: [place] CV LAB;  Service: Cardiovascular;  Laterality: N/A;    Current Outpatient Medications  Medication Sig Dispense Refill   acetaminophen (TYLENOL) 325 MG tablet Take 650 mg by mouth daily as needed for headache (pain).     allopurinol (ZYLOPRIM) 100 MG tablet Take 1 tablet (100 mg total) by mouth at  bedtime.     amiodarone (PACERONE) 200 MG tablet Take 1 tablet (200 mg total) by mouth in the morning.     apixaban (ELIQUIS) 2.5 MG TABS tablet Take 2.5 mg by mouth 2 (two) times daily.     atorvastatin (LIPITOR) 40 MG tablet Take 40 mg by mouth daily in the afternoon.     Biotin 5000 MCG TABS Take 5,000 mcg by mouth in the morning.     budesonide-formoterol (SYMBICORT) 160-4.5 MCG/ACT inhaler Inhale 1 puff into the lungs daily.     Camphor-Menthol-Methyl Sal (SALONPAS) 3.08-10-08 % PTCH Place 1 patch onto the skin daily as needed (pain).     Carboxymethylcellul-Glycerin (CLEAR EYES FOR DRY EYES) 1-0.25 % SOLN Place 1 drop into both eyes daily as needed (dry eys).     Cholecalciferol (VITAMIN D3) 50 MCG (2000 UT) TABS Take 2,000 Units by mouth in the morning.     esomeprazole (NEXIUM) 40 MG capsule Take 1 capsule (40 mg total) by mouth 2 (two) times daily before a meal. 60 capsule 0   ferric citrate (AURYXIA) 1 GM 210 MG(Fe) tablet Take 210 mg by mouth 3 (three) times daily with meals.     gabapentin (NEURONTIN) 100 MG capsule Take 100 mg by mouth at bedtime.     iron sucrose in sodium chloride 0.9 % 100 mL Iron Sucrose (Venofer)     Methoxy PEG-Epoetin Beta (MIRCERA IJ) Mircera     midodrine (PROAMATINE) 10 MG tablet Take 1 tablet (10 mg total) by mouth 3 (three) times daily with meals. 90 tablet 0   nitroGLYCERIN (NITROSTAT) 0.4 MG SL tablet Place 0.4 mg under the tongue every 5 (five) minutes x 3 doses as needed for chest pain.     OXYGEN Inhale 2 L into the lungs See admin instructions. Use every night and as needed during the day     promethazine (PHENERGAN) 12.5 MG tablet Take 1 tablet (12.5 mg total) by mouth every 8 (eight) hours as needed for nausea or vomiting. 30 tablet 2   promethazine (PHENERGAN) 12.5 MG tablet Take by mouth.     traMADol (ULTRAM) 50 MG tablet Take 1 tablet (50 mg total) by mouth every 12 (twelve) hours as needed for moderate pain. 20 tablet 0   Tuberculin PPD  (TUBERSOL ID) Inject into the skin.     No current facility-administered medications for this visit.   Allergies:  Patient has no known allergies.   Social History: The patient  reports that she quit smoking about 59 years ago. Her smoking use included  cigarettes. She started smoking about 60 years ago. She has a 1.00 pack-year smoking history. She has never used smokeless tobacco. She reports that she does not drink alcohol and does not use drugs.   Family History: The patient's family history includes Asthma in her mother and sister; Cervical cancer in her mother; Colon cancer in her brother; Diabetes in her brother, mother, and sister; Early death in her father; Heart attack in her brother, maternal grandmother, mother, sister, and another family member; Heart disease in her brother and mother; Kidney failure in her sister; Stroke in her maternal grandmother, mother, and sister; Ulcers in her mother and sister.   ROS:  Please see the history of present illness. Otherwise, complete review of systems is positive for {NONE DEFAULTED:18576}.  All other systems are reviewed and negative.   Physical Exam: VS:  LMP  (LMP Unknown) , BMI There is no height or weight on file to calculate BMI.  Wt Readings from Last 3 Encounters:  05/17/21 148 lb (67.1 kg)  05/07/21 141 lb 1.5 oz (64 kg)  04/21/21 140 lb 6.9 oz (63.7 kg)    General: Patient appears comfortable at rest. HEENT: Conjunctiva and lids normal, oropharynx clear with moist mucosa. Neck: Supple, no elevated JVP or carotid bruits, no thyromegaly. Lungs: Clear to auscultation, nonlabored breathing at rest. Cardiac: Regular rate and rhythm, no S3 or significant systolic murmur, no pericardial rub. Abdomen: Soft, nontender, no hepatomegaly, bowel sounds present, no guarding or rebound. Extremities: No pitting edema, distal pulses 2+. Skin: Warm and dry. Musculoskeletal: No kyphosis. Neuropsychiatric: Alert and oriented x3, affect grossly  appropriate.  ECG:  {EKG/Telemetry Strips Reviewed:[id number]}  Recent Labwork: 03/16/2021: TSH 0.691 05/08/2021: Magnesium 1.9 05/17/2021: ALT 11; AST 18; BUN 20; Creatinine, Ser 3.18; Hemoglobin 9.1; Platelets 194; Potassium 3.2; Sodium 133     Component Value Date/Time   CHOL 115 03/14/2021 0044   TRIG 75 03/14/2021 0044   HDL 40 (L) 03/14/2021 0044   CHOLHDL 2.9 03/14/2021 0044   VLDL 15 03/14/2021 0044   LDLCALC 60 03/14/2021 0044   LDLCALC 186 (H) 10/10/2017 1050    Other Studies Reviewed Today:  Echocardiogram 03/15/2021 1. Technically difficult study - essentially only [place] contrast  images were interpretable   2. Left ventricular ejection fraction, by estimation, is 65 to 70%. The  left ventricle has normal function. The left ventricle has no regional  wall motion abnormalities. Left ventricular diastolic parameters are  consistent with Grade I diastolic  dysfunction (impaired relaxation). Elevated left ventricular end-diastolic  pressure.   3. Right ventricular systolic function was not well visualized. The right  ventricular size is not well visualized.   4. The mitral valve was not well visualized. No evidence of mitral valve  regurgitation.   5. The aortic valve was not well visualized. Aortic valve regurgitation  is not visualized.   Comparison(s): Changes from prior study are noted. 07/27/2019: LVEF  55-60%.     Echocardiogram June 16, 2019 Left ventricular ejection fraction, by visual estimation, is 55 to 60%. The left ventricle has normal function. There is no left ventricular hypertrophy. 2. Elevated left atrial pressure. 3. Left ventricular diastolic parameters are consistent with Grade II diastolic dysfunction (pseudonormalization). 4. Global right ventricle has normal systolic function.The right ventricular size is normal. No increase in right ventricular wall thickness. 5. Left atrial size was moderately dilated. 6. Right atrial size was  normal. 7. The mitral valve was not well visualized. Trace mitral valve regurgitation. No evidence of  mitral stenosis. 8. The tricuspid valve is normal in structure. Tricuspid valve regurgitation is mild. 9. The aortic valve was not well visualized. Aortic valve regurgitation is not visualized. No evidence of aortic valve sclerosis or stenosis. 10. The pulmonic valve was not well visualized. Pulmonic valve regurgitation not assessed. 11. The aortic root was not well visualized. 12. Moderately elevated pulmonary artery systolic pressure. 13. Moderate pulmonary HTN, PASP is 48 mmHg. 14. The inferior vena cava is normal in size with greater than 50% respiratory variability, suggesting right atrial pressure of 3 mmHg. 15. The interatrial septum was not well visualized    Assessment and Plan:  1. Chronic diastolic heart failure ([place])   2. Essential (primary) hypertension   3. CAD in native artery   4. Atrial fibrillation with RVR ([place])      1. Chronic diastolic CHF (congestive heart failure) [facility]) Recent admission to [facility] for mild acute CHF exacerbation.  She was started on torsemide 50 mg daily by nephrology and condition improved.  Recent echocardiogram showed grade 2 diastolic dysfunction in November.  Patient was discharged on torsemide 50 mg daily which she continues to take.  Sees nephrology who prescribed torsemide in hospital.    2. Essential (primary) hypertension Blood pressure within normal limits today at 120/76.  She is currently taking Toprol XL 75 mg daily. Tamsulosin 0.4 mg daily. Hydralazine 25 mg bid.  Continue current therapy.      3. Coronary artery disease involving coronary bypass graft of native heart without angina pectoris Patient denies any current progressive anginal or exertional symptoms. She is resident of [facility] currently for rehab.   4. Atrial fibrillation with RVR (HCC) Denies any palpitations or arrhythmias.  Heart rate controlled at  89 Continue Cardizem CD 120 mg daily.  Continue Eliquis 5 mg p.o. twice daily.  Recent renal function showed BUN of 105, creatinine 2.87, GFR 16.  Stop ASA.     Medication Adjustments/Labs and Tests Ordered: Current medicines are reviewed at length with the patient today.  Concerns regarding medicines are outlined above.   Disposition: Follow-up with ***  Signed, Levell July, NP 05/17/2021 9:31 PM    [facility] at [facility], [place], [place] [address] Phone: [phone]; Fax: [phone]

## 2021-05-17 NOTE — ED Notes (Signed)
Report given to nurse at [facility], pt awaiting transport via EMS. Pt denies needs.

## 2021-05-18 ENCOUNTER — Ambulatory Visit: Payer: Medicare Other | Admitting: Family Medicine

## 2021-05-18 DIAGNOSIS — I4891 Unspecified atrial fibrillation: Secondary | ICD-10-CM

## 2021-05-18 DIAGNOSIS — I2510 Atherosclerotic heart disease of native coronary artery without angina pectoris: Secondary | ICD-10-CM

## 2021-05-18 DIAGNOSIS — I10 Essential (primary) hypertension: Secondary | ICD-10-CM

## 2021-05-18 DIAGNOSIS — I5032 Chronic diastolic (congestive) heart failure: Secondary | ICD-10-CM

## 2021-05-22 ENCOUNTER — Other Ambulatory Visit: Payer: Self-pay

## 2021-05-22 ENCOUNTER — Emergency Department (HOSPITAL_COMMUNITY): Payer: Medicare Other

## 2021-05-22 ENCOUNTER — Encounter (HOSPITAL_COMMUNITY): Payer: Self-pay | Admitting: *Deleted

## 2021-05-22 ENCOUNTER — Inpatient Hospital Stay (HOSPITAL_COMMUNITY)
Admission: EM | Admit: 2021-05-22 | Discharge: 2021-06-05 | DRG: 871 | Disposition: E | Payer: Medicare Other | Source: Skilled Nursing Facility | Attending: Internal Medicine | Admitting: Internal Medicine

## 2021-05-22 DIAGNOSIS — E1142 Type 2 diabetes mellitus with diabetic polyneuropathy: Secondary | ICD-10-CM | POA: Diagnosis present

## 2021-05-22 DIAGNOSIS — R0902 Hypoxemia: Secondary | ICD-10-CM

## 2021-05-22 DIAGNOSIS — N186 End stage renal disease: Secondary | ICD-10-CM | POA: Diagnosis present

## 2021-05-22 DIAGNOSIS — J90 Pleural effusion, not elsewhere classified: Secondary | ICD-10-CM | POA: Diagnosis present

## 2021-05-22 DIAGNOSIS — Z992 Dependence on renal dialysis: Secondary | ICD-10-CM

## 2021-05-22 DIAGNOSIS — Z515 Encounter for palliative care: Secondary | ICD-10-CM | POA: Diagnosis not present

## 2021-05-22 DIAGNOSIS — J9611 Chronic respiratory failure with hypoxia: Secondary | ICD-10-CM | POA: Diagnosis not present

## 2021-05-22 DIAGNOSIS — R627 Adult failure to thrive: Secondary | ICD-10-CM | POA: Diagnosis present

## 2021-05-22 DIAGNOSIS — G4733 Obstructive sleep apnea (adult) (pediatric): Secondary | ICD-10-CM | POA: Diagnosis present

## 2021-05-22 DIAGNOSIS — R6520 Severe sepsis without septic shock: Secondary | ICD-10-CM | POA: Diagnosis not present

## 2021-05-22 DIAGNOSIS — Z8249 Family history of ischemic heart disease and other diseases of the circulatory system: Secondary | ICD-10-CM

## 2021-05-22 DIAGNOSIS — E782 Mixed hyperlipidemia: Secondary | ICD-10-CM | POA: Diagnosis present

## 2021-05-22 DIAGNOSIS — M810 Age-related osteoporosis without current pathological fracture: Secondary | ICD-10-CM | POA: Diagnosis present

## 2021-05-22 DIAGNOSIS — Z87891 Personal history of nicotine dependence: Secondary | ICD-10-CM

## 2021-05-22 DIAGNOSIS — E1122 Type 2 diabetes mellitus with diabetic chronic kidney disease: Secondary | ICD-10-CM | POA: Diagnosis present

## 2021-05-22 DIAGNOSIS — L89153 Pressure ulcer of sacral region, stage 3: Secondary | ICD-10-CM | POA: Diagnosis present

## 2021-05-22 DIAGNOSIS — K529 Noninfective gastroenteritis and colitis, unspecified: Secondary | ICD-10-CM | POA: Diagnosis present

## 2021-05-22 DIAGNOSIS — Z20822 Contact with and (suspected) exposure to covid-19: Secondary | ICD-10-CM | POA: Diagnosis present

## 2021-05-22 DIAGNOSIS — K219 Gastro-esophageal reflux disease without esophagitis: Secondary | ICD-10-CM | POA: Diagnosis present

## 2021-05-22 DIAGNOSIS — J449 Chronic obstructive pulmonary disease, unspecified: Secondary | ICD-10-CM | POA: Diagnosis present

## 2021-05-22 DIAGNOSIS — L8990 Pressure ulcer of unspecified site, unspecified stage: Secondary | ICD-10-CM | POA: Insufficient documentation

## 2021-05-22 DIAGNOSIS — I480 Paroxysmal atrial fibrillation: Secondary | ICD-10-CM | POA: Diagnosis present

## 2021-05-22 DIAGNOSIS — E43 Unspecified severe protein-calorie malnutrition: Secondary | ICD-10-CM | POA: Diagnosis present

## 2021-05-22 DIAGNOSIS — I132 Hypertensive heart and chronic kidney disease with heart failure and with stage 5 chronic kidney disease, or end stage renal disease: Secondary | ICD-10-CM | POA: Diagnosis present

## 2021-05-22 DIAGNOSIS — K2100 Gastro-esophageal reflux disease with esophagitis, without bleeding: Secondary | ICD-10-CM | POA: Diagnosis not present

## 2021-05-22 DIAGNOSIS — Z6833 Body mass index (BMI) 33.0-33.9, adult: Secondary | ICD-10-CM

## 2021-05-22 DIAGNOSIS — E669 Obesity, unspecified: Secondary | ICD-10-CM | POA: Diagnosis present

## 2021-05-22 DIAGNOSIS — E876 Hypokalemia: Secondary | ICD-10-CM | POA: Diagnosis present

## 2021-05-22 DIAGNOSIS — Z8049 Family history of malignant neoplasm of other genital organs: Secondary | ICD-10-CM

## 2021-05-22 DIAGNOSIS — Z9115 Patient's noncompliance with renal dialysis: Secondary | ICD-10-CM

## 2021-05-22 DIAGNOSIS — K449 Diaphragmatic hernia without obstruction or gangrene: Secondary | ICD-10-CM

## 2021-05-22 DIAGNOSIS — Z833 Family history of diabetes mellitus: Secondary | ICD-10-CM

## 2021-05-22 DIAGNOSIS — G9341 Metabolic encephalopathy: Secondary | ICD-10-CM | POA: Diagnosis present

## 2021-05-22 DIAGNOSIS — E538 Deficiency of other specified B group vitamins: Secondary | ICD-10-CM | POA: Diagnosis present

## 2021-05-22 DIAGNOSIS — I959 Hypotension, unspecified: Secondary | ICD-10-CM | POA: Diagnosis not present

## 2021-05-22 DIAGNOSIS — I10 Essential (primary) hypertension: Secondary | ICD-10-CM | POA: Diagnosis not present

## 2021-05-22 DIAGNOSIS — J9811 Atelectasis: Secondary | ICD-10-CM | POA: Diagnosis present

## 2021-05-22 DIAGNOSIS — I5032 Chronic diastolic (congestive) heart failure: Secondary | ICD-10-CM | POA: Diagnosis present

## 2021-05-22 DIAGNOSIS — J9621 Acute and chronic respiratory failure with hypoxia: Secondary | ICD-10-CM | POA: Diagnosis present

## 2021-05-22 DIAGNOSIS — E871 Hypo-osmolality and hyponatremia: Secondary | ICD-10-CM | POA: Diagnosis present

## 2021-05-22 DIAGNOSIS — E8770 Fluid overload, unspecified: Secondary | ICD-10-CM | POA: Diagnosis present

## 2021-05-22 DIAGNOSIS — R0602 Shortness of breath: Secondary | ICD-10-CM | POA: Diagnosis present

## 2021-05-22 DIAGNOSIS — Z7951 Long term (current) use of inhaled steroids: Secondary | ICD-10-CM

## 2021-05-22 DIAGNOSIS — F32A Depression, unspecified: Secondary | ICD-10-CM | POA: Diagnosis present

## 2021-05-22 DIAGNOSIS — I2510 Atherosclerotic heart disease of native coronary artery without angina pectoris: Secondary | ICD-10-CM | POA: Diagnosis present

## 2021-05-22 DIAGNOSIS — Z6834 Body mass index (BMI) 34.0-34.9, adult: Secondary | ICD-10-CM | POA: Diagnosis not present

## 2021-05-22 DIAGNOSIS — I9589 Other hypotension: Secondary | ICD-10-CM | POA: Diagnosis present

## 2021-05-22 DIAGNOSIS — Z7401 Bed confinement status: Secondary | ICD-10-CM

## 2021-05-22 DIAGNOSIS — E8809 Other disorders of plasma-protein metabolism, not elsewhere classified: Secondary | ICD-10-CM | POA: Diagnosis present

## 2021-05-22 DIAGNOSIS — J9601 Acute respiratory failure with hypoxia: Secondary | ICD-10-CM | POA: Diagnosis present

## 2021-05-22 DIAGNOSIS — Z66 Do not resuscitate: Secondary | ICD-10-CM | POA: Diagnosis not present

## 2021-05-22 DIAGNOSIS — R4589 Other symptoms and signs involving emotional state: Secondary | ICD-10-CM | POA: Diagnosis not present

## 2021-05-22 DIAGNOSIS — Z79899 Other long term (current) drug therapy: Secondary | ICD-10-CM

## 2021-05-22 DIAGNOSIS — Z841 Family history of disorders of kidney and ureter: Secondary | ICD-10-CM

## 2021-05-22 DIAGNOSIS — D631 Anemia in chronic kidney disease: Secondary | ICD-10-CM | POA: Diagnosis present

## 2021-05-22 DIAGNOSIS — Z7901 Long term (current) use of anticoagulants: Secondary | ICD-10-CM

## 2021-05-22 DIAGNOSIS — Z981 Arthrodesis status: Secondary | ICD-10-CM

## 2021-05-22 DIAGNOSIS — R6521 Severe sepsis with septic shock: Secondary | ICD-10-CM | POA: Diagnosis present

## 2021-05-22 DIAGNOSIS — Z800 Family history of malignant neoplasm of digestive organs: Secondary | ICD-10-CM

## 2021-05-22 DIAGNOSIS — R54 Age-related physical debility: Secondary | ICD-10-CM | POA: Diagnosis present

## 2021-05-22 DIAGNOSIS — M1990 Unspecified osteoarthritis, unspecified site: Secondary | ICD-10-CM | POA: Diagnosis present

## 2021-05-22 DIAGNOSIS — Z8541 Personal history of malignant neoplasm of cervix uteri: Secondary | ICD-10-CM

## 2021-05-22 DIAGNOSIS — Z90711 Acquired absence of uterus with remaining cervical stump: Secondary | ICD-10-CM

## 2021-05-22 DIAGNOSIS — E1165 Type 2 diabetes mellitus with hyperglycemia: Secondary | ICD-10-CM | POA: Diagnosis present

## 2021-05-22 DIAGNOSIS — M898X9 Other specified disorders of bone, unspecified site: Secondary | ICD-10-CM | POA: Diagnosis present

## 2021-05-22 DIAGNOSIS — F419 Anxiety disorder, unspecified: Secondary | ICD-10-CM | POA: Diagnosis present

## 2021-05-22 DIAGNOSIS — E039 Hypothyroidism, unspecified: Secondary | ICD-10-CM | POA: Diagnosis present

## 2021-05-22 DIAGNOSIS — Z9981 Dependence on supplemental oxygen: Secondary | ICD-10-CM

## 2021-05-22 DIAGNOSIS — Z7189 Other specified counseling: Secondary | ICD-10-CM | POA: Diagnosis not present

## 2021-05-22 DIAGNOSIS — Z825 Family history of asthma and other chronic lower respiratory diseases: Secondary | ICD-10-CM

## 2021-05-22 DIAGNOSIS — A419 Sepsis, unspecified organism: Principal | ICD-10-CM | POA: Diagnosis present

## 2021-05-22 DIAGNOSIS — Z823 Family history of stroke: Secondary | ICD-10-CM

## 2021-05-22 DIAGNOSIS — M7989 Other specified soft tissue disorders: Secondary | ICD-10-CM | POA: Diagnosis present

## 2021-05-22 HISTORY — DX: Irritable bowel syndrome, unspecified: K58.9

## 2021-05-22 HISTORY — DX: Anemia, unspecified: D64.9

## 2021-05-22 HISTORY — DX: Major depressive disorder, single episode, unspecified: F32.9

## 2021-05-22 HISTORY — DX: Hyperlipidemia, unspecified: E78.5

## 2021-05-22 LAB — BASIC METABOLIC PANEL
Anion gap: 11 (ref 5–15)
BUN: 56 mg/dL — ABNORMAL HIGH (ref 8–23)
CO2: 23 mmol/L (ref 22–32)
Calcium: 9.6 mg/dL (ref 8.9–10.3)
Chloride: 101 mmol/L (ref 98–111)
Creatinine, Ser: 7.56 mg/dL — ABNORMAL HIGH (ref 0.44–1.00)
GFR, Estimated: 5 mL/min — ABNORMAL LOW (ref >60)
Glucose, Bld: 88 mg/dL (ref 70–99)
Potassium: 4.7 mmol/L (ref 3.5–5.1)
Sodium: 135 mmol/L (ref 135–145)

## 2021-05-22 LAB — CBC WITH DIFFERENTIAL/PLATELET
Abs Immature Granulocytes: 0.27 10*3/uL — ABNORMAL HIGH (ref 0.00–0.07)
Basophils Absolute: 0.1 10*3/uL (ref 0.0–0.1)
Basophils Relative: 0 %
Eosinophils Absolute: 0 10*3/uL (ref 0.0–0.5)
Eosinophils Relative: 0 %
HCT: 28.3 % — ABNORMAL LOW (ref 36.0–46.0)
Hemoglobin: 9.4 g/dL — ABNORMAL LOW (ref 12.0–15.0)
Immature Granulocytes: 2 %
Lymphocytes Relative: 4 %
Lymphs Abs: 0.7 10*3/uL (ref 0.7–4.0)
MCH: 31.2 pg (ref 26.0–34.0)
MCHC: 33.2 g/dL (ref 30.0–36.0)
MCV: 94 fL (ref 80.0–100.0)
Monocytes Absolute: 0.8 10*3/uL (ref 0.1–1.0)
Monocytes Relative: 5 %
Neutro Abs: 16.2 10*3/uL — ABNORMAL HIGH (ref 1.7–7.7)
Neutrophils Relative %: 89 %
Platelets: 251 10*3/uL (ref 150–400)
RBC: 3.01 MIL/uL — ABNORMAL LOW (ref 3.87–5.11)
RDW: 19.9 % — ABNORMAL HIGH (ref 11.5–15.5)
WBC: 18 10*3/uL — ABNORMAL HIGH (ref 4.0–10.5)
nRBC: 0.3 % — ABNORMAL HIGH (ref 0.0–0.2)

## 2021-05-22 LAB — BLOOD GAS, ARTERIAL
Acid-Base Excess: 0.2 mmol/L (ref 0.0–2.0)
Bicarbonate: 24.6 mmol/L (ref 20.0–28.0)
FIO2: 30
O2 Saturation: 97.3 %
Patient temperature: 37.3
pCO2 arterial: 40.6 mmHg (ref 32.0–48.0)
pH, Arterial: 7.398 (ref 7.350–7.450)
pO2, Arterial: 97.8 mmHg (ref 83.0–108.0)

## 2021-05-22 LAB — IRON AND TIBC: Iron: 44 ug/dL (ref 28–170)

## 2021-05-22 LAB — TROPONIN I (HIGH SENSITIVITY)
Troponin I (High Sensitivity): 16 ng/L (ref <18)
Troponin I (High Sensitivity): 17 ng/L (ref <18)

## 2021-05-22 LAB — CBG MONITORING, ED: Glucose-Capillary: 76 mg/dL (ref 70–99)

## 2021-05-22 LAB — FOLATE: Folate: 11.5 ng/mL (ref >5.9)

## 2021-05-22 LAB — PROCALCITONIN: Procalcitonin: 1.31 ng/mL

## 2021-05-22 LAB — FERRITIN: Ferritin: 971 ng/mL — ABNORMAL HIGH (ref 11–307)

## 2021-05-22 LAB — LACTIC ACID, PLASMA: Lactic Acid, Venous: 0.5 mmol/L (ref 0.5–1.9)

## 2021-05-22 LAB — VITAMIN B12: Vitamin B-12: 177 pg/mL — ABNORMAL LOW (ref 180–914)

## 2021-05-22 MED ORDER — FERROUS SULFATE 325 (65 FE) MG PO TABS
325.0000 mg | ORAL_TABLET | Freq: Every day | ORAL | Status: DC
  Administered 2021-05-23 – 2021-05-27 (×6): 325 mg via ORAL
  Filled 2021-05-22 (×6): qty 1

## 2021-05-22 MED ORDER — LIDOCAINE-PRILOCAINE 2.5-2.5 % EX CREA
1.0000 "application " | TOPICAL_CREAM | CUTANEOUS | Status: DC | PRN
  Filled 2021-05-22: qty 5

## 2021-05-22 MED ORDER — ALLOPURINOL 100 MG PO TABS
100.0000 mg | ORAL_TABLET | Freq: Every day | ORAL | Status: DC
  Administered 2021-05-23 – 2021-05-27 (×6): 100 mg via ORAL
  Filled 2021-05-22 (×6): qty 1

## 2021-05-22 MED ORDER — HYDROCODONE-ACETAMINOPHEN 5-325 MG PO TABS: 1.0000 | ORAL_TABLET | Freq: Four times a day (QID) | ORAL | Status: DC | PRN

## 2021-05-22 MED ORDER — ACETAMINOPHEN 325 MG PO TABS
650.0000 mg | ORAL_TABLET | Freq: Four times a day (QID) | ORAL | Status: DC | PRN
  Administered 2021-05-29: 650 mg via ORAL
  Filled 2021-05-22 (×2): qty 2

## 2021-05-22 MED ORDER — LIDOCAINE HCL (PF) 1 % IJ SOLN: 5.0000 mL | INTRAMUSCULAR | Status: DC | PRN

## 2021-05-22 MED ORDER — ALBUTEROL SULFATE (2.5 MG/3ML) 0.083% IN NEBU: 2.5000 mg | INHALATION_SOLUTION | RESPIRATORY_TRACT | Status: DC | PRN

## 2021-05-22 MED ORDER — CHLORHEXIDINE GLUCONATE CLOTH 2 % EX PADS
6.0000 | MEDICATED_PAD | Freq: Every day | CUTANEOUS | Status: DC
  Administered 2021-05-23 – 2021-05-30 (×7): 6 via TOPICAL

## 2021-05-22 MED ORDER — PENTAFLUOROPROP-TETRAFLUOROETH EX AERO
1.0000 "application " | INHALATION_SPRAY | CUTANEOUS | Status: DC | PRN
  Filled 2021-05-22: qty 30

## 2021-05-22 MED ORDER — AMIODARONE HCL 200 MG PO TABS
200.0000 mg | ORAL_TABLET | Freq: Every morning | ORAL | Status: DC
  Administered 2021-05-23 – 2021-05-30 (×7): 200 mg via ORAL
  Filled 2021-05-22 (×5): qty 1

## 2021-05-22 MED ORDER — NOREPINEPHRINE 4 MG/250ML-% IV SOLN
2.0000 ug/min | INTRAVENOUS | Status: DC
  Administered 2021-05-22: 10 ug/min via INTRAVENOUS
  Administered 2021-05-22: 8 ug/min via INTRAVENOUS
  Administered 2021-05-22: 2 ug/min via INTRAVENOUS
  Administered 2021-05-23: 10 ug/min via INTRAVENOUS
  Filled 2021-05-22 (×3): qty 250

## 2021-05-22 MED ORDER — POLYETHYLENE GLYCOL 3350 17 G PO PACK: 17.0000 g | PACK | Freq: Every day | ORAL | Status: DC | PRN

## 2021-05-22 MED ORDER — MIRTAZAPINE 7.5 MG PO TABS
7.5000 mg | ORAL_TABLET | Freq: Every day | ORAL | Status: DC
  Administered 2021-05-23 – 2021-05-27 (×6): 7.5 mg via ORAL
  Filled 2021-05-22 (×8): qty 1

## 2021-05-22 MED ORDER — SODIUM CHLORIDE 0.9 % IV SOLN
2.0000 g | INTRAVENOUS | Status: DC
  Administered 2021-05-24 – 2021-05-26 (×2): 2 g via INTRAVENOUS
  Filled 2021-05-22 (×2): qty 2

## 2021-05-22 MED ORDER — INSULIN ASPART 100 UNIT/ML IJ SOLN
0.0000 [IU] | Freq: Three times a day (TID) | INTRAMUSCULAR | Status: DC
  Administered 2021-05-23 – 2021-05-24 (×4): 1 [IU] via SUBCUTANEOUS

## 2021-05-22 MED ORDER — ACETAMINOPHEN 650 MG RE SUPP
650.0000 mg | Freq: Four times a day (QID) | RECTAL | Status: DC | PRN
  Filled 2021-05-22: qty 1

## 2021-05-22 MED ORDER — APIXABAN 2.5 MG PO TABS
2.5000 mg | ORAL_TABLET | Freq: Two times a day (BID) | ORAL | Status: DC
  Administered 2021-05-22 – 2021-05-29 (×13): 2.5 mg via ORAL
  Filled 2021-05-22 (×14): qty 1

## 2021-05-22 MED ORDER — ONDANSETRON HCL 4 MG PO TABS: 4.0000 mg | ORAL_TABLET | Freq: Four times a day (QID) | ORAL | Status: DC | PRN

## 2021-05-22 MED ORDER — ACETAMINOPHEN 325 MG PO TABS
650.0000 mg | ORAL_TABLET | Freq: Four times a day (QID) | ORAL | Status: DC | PRN
  Administered 2021-05-27: 650 mg via ORAL
  Filled 2021-05-22: qty 2

## 2021-05-22 MED ORDER — POLYVINYL ALCOHOL 1.4 % OP SOLN
1.0000 [drp] | Freq: Every day | OPHTHALMIC | Status: DC | PRN
  Filled 2021-05-22: qty 15

## 2021-05-22 MED ORDER — GABAPENTIN 100 MG PO CAPS
100.0000 mg | ORAL_CAPSULE | Freq: Every day | ORAL | Status: DC
  Administered 2021-05-23: 100 mg via ORAL
  Filled 2021-05-22: qty 1

## 2021-05-22 MED ORDER — SODIUM CHLORIDE 0.9 % IV SOLN: 100.0000 mL | INTRAVENOUS | Status: DC | PRN

## 2021-05-22 MED ORDER — ONDANSETRON HCL 4 MG/2ML IJ SOLN
4.0000 mg | Freq: Four times a day (QID) | INTRAMUSCULAR | Status: DC | PRN
  Administered 2021-05-27 – 2021-05-30 (×2): 4 mg via INTRAVENOUS
  Filled 2021-05-22 (×2): qty 2

## 2021-05-22 MED ORDER — ALTEPLASE 2 MG IJ SOLR
2.0000 mg | Freq: Once | INTRAMUSCULAR | Status: AC | PRN
Start: 2021-05-22 — End: 2021-05-27
  Administered 2021-05-27: 2 mg
  Filled 2021-05-22 (×2): qty 2

## 2021-05-22 MED ORDER — MOMETASONE FURO-FORMOTEROL FUM 200-5 MCG/ACT IN AERO
2.0000 | INHALATION_SPRAY | Freq: Two times a day (BID) | RESPIRATORY_TRACT | Status: DC
  Administered 2021-05-23 – 2021-05-30 (×12): 2 via RESPIRATORY_TRACT
  Filled 2021-05-22 (×2): qty 8.8

## 2021-05-22 MED ORDER — VANCOMYCIN HCL 750 MG/150ML IV SOLN
750.0000 mg | INTRAVENOUS | Status: DC
  Administered 2021-05-24: 750 mg via INTRAVENOUS
  Filled 2021-05-22: qty 150

## 2021-05-22 MED ORDER — ATORVASTATIN CALCIUM 40 MG PO TABS
40.0000 mg | ORAL_TABLET | Freq: Every day | ORAL | Status: DC
  Administered 2021-05-23 – 2021-05-28 (×6): 40 mg via ORAL
  Filled 2021-05-22 (×6): qty 1

## 2021-05-22 MED ORDER — SODIUM CHLORIDE 0.9 % IV SOLN
250.0000 mL | INTRAVENOUS | Status: DC
  Administered 2021-05-22: 250 mL via INTRAVENOUS

## 2021-05-22 MED ORDER — ALBUMIN HUMAN 25 % IV SOLN
25.0000 g | INTRAVENOUS | Status: AC
  Administered 2021-05-22 (×2): 25 g via INTRAVENOUS

## 2021-05-22 MED ORDER — VANCOMYCIN HCL 1500 MG/300ML IV SOLN
1500.0000 mg | Freq: Once | INTRAVENOUS | Status: AC
  Administered 2021-05-22: 1500 mg via INTRAVENOUS
  Filled 2021-05-22: qty 300

## 2021-05-22 MED ORDER — PANTOPRAZOLE SODIUM 40 MG PO TBEC
80.0000 mg | DELAYED_RELEASE_TABLET | Freq: Two times a day (BID) | ORAL | Status: DC
  Administered 2021-05-22 – 2021-05-28 (×13): 80 mg via ORAL
  Filled 2021-05-22 (×13): qty 2

## 2021-05-22 MED ORDER — SODIUM CHLORIDE 0.9 % IV SOLN
2.0000 g | Freq: Once | INTRAVENOUS | Status: AC
  Administered 2021-05-22: 2 g via INTRAVENOUS
  Filled 2021-05-22: qty 2

## 2021-05-22 MED ORDER — HEPARIN SODIUM (PORCINE) 1000 UNIT/ML DIALYSIS
1000.0000 [IU] | INTRAMUSCULAR | Status: DC | PRN
  Administered 2021-05-22: 3800 [IU] via INTRAVENOUS_CENTRAL
  Administered 2021-05-27: 1000 [IU] via INTRAVENOUS_CENTRAL
  Filled 2021-05-22 (×2): qty 1

## 2021-05-22 MED ORDER — MIDODRINE HCL 5 MG PO TABS
10.0000 mg | ORAL_TABLET | Freq: Three times a day (TID) | ORAL | Status: DC
  Administered 2021-05-22 – 2021-05-23 (×2): 10 mg via ORAL
  Filled 2021-05-22 (×2): qty 2

## 2021-05-22 MED ORDER — HYDROCODONE-ACETAMINOPHEN 5-325 MG PO TABS
1.0000 | ORAL_TABLET | ORAL | Status: DC | PRN
  Administered 2021-05-23: 2 via ORAL
  Administered 2021-05-23 – 2021-05-25 (×3): 1 via ORAL
  Administered 2021-05-27 – 2021-05-28 (×4): 2 via ORAL
  Filled 2021-05-22 (×3): qty 1
  Filled 2021-05-22 (×5): qty 2

## 2021-05-22 NOTE — Progress Notes (Signed)
Pharmacy Antibiotic Note  [name] is a 75 y.o. female admitted on 05/14/2021 with sepsis.  Pharmacy has been consulted for Vancomycin and cefepime dosing.  Plan: Vancomycin 1500 mg IV x 1 dose. Vancomycin 750 mg IV every T-Th-Sat w/ HD Cefepime 2000 mg IV every T-Th-Sat w/ HD Monitor labs, c/s, and vanco level as indicated.  Height: 4\' 8"  (142.2 cm) Weight: 67.1 kg (148 lb) IBW/kg (Calculated) : 36.3  Temp (24hrs), Avg:99.1 F (37.3 C), Min:99.1 F (37.3 C), Max:99.1 F (37.3 C)  Recent Labs  Lab 05/17/21 1529 05/24/2021 1243 05/18/2021 1459  WBC 13.9*  --  18.0*  CREATININE 3.18* 7.56*  --     Estimated Creatinine Clearance: 4.9 mL/min (A) (by C-G formula based on SCr of 7.56 mg/dL (H)).    No Known Allergies  Antimicrobials this admission: Vanco 10/18 >> Cefepime 10/18 >>  Dose adjustments this admission: HD  Microbiology results: 10/18 BCx: pending   Thank you for allowing pharmacy to be a part of this patient's care.  Margot Ables, PharmD Clinical Pharmacist 05/29/2021 4:26 PM

## 2021-05-22 NOTE — ED Provider Notes (Signed)
[facility] EMERGENCY DEPARTMENT Provider Note   CSN: [id number] Arrival date & time: 05/11/2021  1056     History No chief complaint on file.   [name] is a 75 y.o. female.  Patient history of congestive heart failure coronary artery disease COPD.  She also is a dialysis patient.  Patient missed her last dialysis on Saturday.  Her last dialysis was last Thursday.  She presents with some shortness of breath.    Shortness of Breath Severity:  Mild Onset quality:  Sudden Timing:  Constant Progression:  Waxing and waning Chronicity:  Recurrent Context: activity   Relieved by:  Nothing Worsened by:  Nothing Ineffective treatments:  None tried Associated symptoms: no abdominal pain, no chest pain, no cough, no headaches and no rash       Past Medical History:  Diagnosis Date   Anemia    Blood transfusion without reported diagnosis    CAD (coronary artery disease)    Nonobstructive at cardiac catheterization 2000   Cataract    Cervical cancer ([place]) 1978   CHF (congestive heart failure) (HCC)    Chronic back pain    Chronic kidney disease    Class 2 obesity with body mass index (BMI) of 35 to 39.9 without comorbidity    Complication of anesthesia    hard to awaken with one back surgery - years ago, no problem since   COPD (chronic obstructive pulmonary disease) ([place])    Degenerative disc disease    DM (diabetes mellitus), type 2 with renal complications ([place])    Dyslipidemia    Dyspnea    "due to COPD"   Dysrhythmia    a-fib   ESRD (end stage renal disease) on dialysis ([place])    TTHS- [address]   GERD (gastroesophageal reflux disease)    Gout    Hiatal hernia 07/27/2013   History of diverticulitis of colon    History of hiatal hernia    HTN (hypertension)    09/09/20 not currently on medication, is on medication for hypotension   Hyperlipidemia    Hypotension    Hypothyroidism, unspecified 04/23/2021   IBS (irritable bowel syndrome)    Iron  deficiency anemia    Irritable bowel syndrome    Lumbar radiculopathy    MDD (major depressive disorder)    Mixed hyperlipidemia    Moderate major depression, single episode ([place]) 07/21/2019   Neuropathy    OSA (obstructive sleep apnea)    Osteoporosis    Ovarian cancer ([place]) 1978   patient denies. States this was cervical cancer   Oxygen deficiency    room air   PAF (paroxysmal atrial fibrillation) (HCC)    Pneumonia    Sleep apnea    bipap - oxygen 2 l to bipap.   Type 2 diabetes mellitus ([place])    Vitamin B deficiency 12/25/2009   Vitamin B12 deficiency     Patient Active Problem List   Diagnosis Date Noted   Edema of left upper arm 05/08/2021   Generalized weakness 05/08/2021   Hypothyroidism, unspecified 04/23/2021   Diverticulitis    Non-intractable vomiting    Anticoagulated 03/05/2021   GI bleed 02/25/2021   Acute blood loss anemia 02/25/2021   Hypotension 02/25/2021   Presence of cardiac and vascular implant and graft, unspecified 11/14/2020   Diverticulitis of colon without hemorrhage 09/08/2020   Other disorders of phosphorus metabolism 08/31/2020   Acute respiratory failure due to COVID-19 [facility]) 07/20/2020   Acute respiratory failure with hypoxia (  [place]) 07/20/2020   Allergy, unspecified, sequela 06/28/2020   Personal history of anaphylaxis 06/28/2020   Recurrent falls 04/25/2020   Fall 04/24/2020   ESRD (end stage renal disease) on dialysis [facility])    Essential hypertension    Dyslipidemia    DM (diabetes mellitus), type 2 with renal complications (HCC)    Class 2 obesity with body mass index (BMI) of 35 to 39.9 without comorbidity    OSA (obstructive sleep apnea)    COPD (chronic obstructive pulmonary disease) (HCC)    PAF (paroxysmal atrial fibrillation) ([place])    Acute respiratory distress 03/09/2020   Fluid overload, unspecified 03/09/2020   Hyperkalemia 03/06/2020   GERD with esophagitis 03/06/2020   Acute bacterial bronchitis 03/06/2020   Anemia in  chronic kidney disease 11/17/2019   Coagulation defect, unspecified ([place]) 11/17/2019   Pain, unspecified 11/17/2019   Pruritus, unspecified 11/17/2019   Secondary hyperparathyroidism of renal origin ([place]) 11/17/2019   Unspecified protein-calorie malnutrition ([place]) 11/17/2019   Loss of weight 10/22/2019   Goals of care, counseling/discussion 07/23/2019   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 07/21/2019   Paroxysmal atrial fibrillation ([place]) 07/21/2019   Encephalopathy 07/21/2019   Moderate major depression, single episode ([place]) 07/21/2019   Pressure ulcer 07/20/2019   Anorexia 07/10/2019   Weight gain with edema 07/02/2019   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 ([place]) 06/22/2019   Neuropathy due to type 2 diabetes mellitus ([place]) 06/22/2019   Mixed diabetic hyperlipidemia associated with type 2 diabetes mellitus ([place]) 06/22/2019   Chronic gout 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 06/22/2019   Quality of life palliative care encounter 06/19/2019   Atrial fibrillation with RVR (HCC)    Atrial flutter with rapid ventricular response ([place]) 06/16/2019   Dyspnea and respiratory abnormalities 06/01/2019   Acute on chronic respiratory failure with hypoxia and hypercapnia with respiratory acidosis 06/01/2019   Anasarca 05/31/2019   Nausea 05/31/2019   Generalized abdominal pain 05/31/2019   History of colonic polyps 05/31/2019   Lymphedema of both lower extremities 04/28/2019   Cellulitis of finger of right hand 08/19/2018   Chronic respiratory failure with hypoxia ([place]) 07/13/2018   Acute renal failure superimposed on stage 3 chronic kidney disease ([place]) 07/13/2018   Chronic diastolic CHF (congestive heart failure) ([place]) 07/13/2018   COPD (chronic obstructive pulmonary disease) ([place]) 07/13/2018   Flatulence 09/18/2017   IBS (irritable bowel syndrome) 04/03/2017   Acute on chronic diastolic CHF (congestive  heart failure) ([place]) 05/08/2016   COPD with acute exacerbation ([place]) 05/08/2016   Constipation 04/25/2016   Peripheral vertigo 06/27/2015   Intractable nausea and vomiting 03/29/2015   Diarrhea, unspecified 12/14/2014   Port-A-Cath in place 10/06/2014   Chronic obstructive pulmonary disease ([employer]) 04/30/2014   Obesity, Class III, BMI 40-49.9 (morbid obesity) ([place]) 04/30/2014   Hemorrhoids, internal, with bleeding 11/10/2013   Symptomatic anemia 08/18/2013   Other diseases of tongue 08/18/2013   Dark stools 07/27/2013   Chest pain 09/05/2012   Arthropathy 12/09/2011   Edema 12/09/2011   Gout 12/09/2011   Hiatal hernia with GERD and esophagitis    Obstructive chronic bronchitis without exacerbation ([place]) 12/26/2009   Hematochezia 12/26/2009   Dysphagia 12/26/2009   DM type 2 with diabetic peripheral neuropathy ([place]) 12/25/2009   Vitamin B deficiency 12/25/2009   Iron deficiency anemia due to chronic blood loss 12/25/2009   Benign essential HTN 12/25/2009   DEGENERATIVE [place] DISEASE 12/25/2009   OSA (obstructive sleep apnea) 12/25/2009  HLD (hyperlipidemia) 12/25/2009   Type 2 diabetes mellitus with ESRD (end-stage renal disease) ([place]) 12/25/2009   Narrowing of intervertebral disc space 12/25/2009    Past Surgical History:  Procedure Laterality Date   ABDOMINAL HYSTERECTOMY     APPLICATION OF WOUND VAC Right 01/15/2021   Procedure: APPLICATION OF WOUND VAC;  Surgeon: Cherre Robins, MD;  Location: [place];  Service: Vascular;  Laterality: Right;   AV FISTULA PLACEMENT Left 08/05/2019   Procedure: ARTERIOVENOUS (AV) FISTULA CREATION LEFT ARM;  Surgeon: Waynetta Sandy, MD;  Location: [place];  Service: Vascular;  Laterality: Left;   AV FISTULA PLACEMENT Left 09/11/2020   Procedure: INSERTION OF ARTERIOVENOUS (AV) GORE-TEX GRAFT ARM LEFT;  Surgeon: Cherre Robins, MD;  Location: [place];  Service: Vascular;  Laterality: Left;   AV FISTULA PLACEMENT Right 11/29/2020    Procedure: Creation of Arteriovenous Fistula Right Upper Arm;  Surgeon: Rosetta Posner, MD;  Location: [place];  Service: Vascular;  Laterality: Right;   [place] Left 10/05/2019   Procedure: SECOND STAGE LEFT [place];  Surgeon: Waynetta Sandy, MD;  Location: [place];  Service: Vascular;  Laterality: Left;   [place] Right 01/15/2021   Procedure: RIGHT UPPER ARM SECOND STAGE [place];  Surgeon: Cherre Robins, MD;  Location: [place];  Service: Vascular;  Laterality: Right;  PERIPHERAL NERVE BLOCK   Benign breast cysts     BIOPSY  02/26/2021   Procedure: BIOPSY;  Surgeon: Jackquline Denmark, MD;  Location: [facility] ENDOSCOPY;  Service: Endoscopy;;   CHOLECYSTECTOMY     COLONOSCOPY  10/01/2006   SLF:Pan colonic diverticulosis and moderate internal hemorrhoids/ Otherwise no polyps, masses, inflammatory changes or AVMs/   COLONOSCOPY  2011   SLF: pancolonic diverticulosis, large internal hemorrhoids   COLONOSCOPY N/A 01/26/2016   Procedure: COLONOSCOPY;  Surgeon: [name], MD;  [facility];  Service: Endoscopy;  Laterality: N/A;  830    COLONOSCOPY WITH PROPOFOL N/A 02/10/2020   Procedure: COLONOSCOPY WITH PROPOFOL;  Surgeon: Daneil Dolin, MD;  [facility];  Service: Endoscopy;  Laterality: N/A;  10:45am   ESOPHAGEAL DILATION  02/26/2021   Procedure: ESOPHAGEAL DILATION;  Surgeon: Jackquline Denmark, MD;  Location: [facility] ENDOSCOPY;  Service: Endoscopy;;   ESOPHAGOGASTRODUODENOSCOPY  11/19/2006   SLF: Large hiatal hernia without evidence of [place] ulcers/. Distal esophageal stricture, which allowed the gastroscope to pass without resistance.  A 16 mm Sav[place] later passed with mild resistance/ Normal stomach.sb bx negative   ESOPHAGOGASTRODUODENOSCOPY  10/01/2006   [id number] hiatal hernia.  Distal esophagus without evidence of   erythema, ulceration or Barrett's esophagus    ESOPHAGOGASTRODUODENOSCOPY  2011   SLF: large hh, distal esophageal web narrowing to 57mm s/p dilation to 6mm   ESOPHAGOGASTRODUODENOSCOPY N/A 08/06/2013   SLF: 1. Stricture at the gastroesophageal junction 2. large hiatal hernia. 3. Mild erosive gastritis.   ESOPHAGOGASTRODUODENOSCOPY (EGD) WITH PROPOFOL N/A 07/19/2019   rourk: Mild erosive reflux esophagitis.  Mild Schatzki ring status post dilation.  Large hiatal hernia with at least one half of the stomach above the diaphragm.  Gastric mucosa erythematous.   ESOPHAGOGASTRODUODENOSCOPY (EGD) WITH PROPOFOL N/A 02/26/2021   Procedure: ESOPHAGOGASTRODUODENOSCOPY (EGD) WITH PROPOFOL;  Surgeon: Jackquline Denmark, MD;  Location: [facility] ENDOSCOPY;  Service: Endoscopy;  Laterality: N/A;   GIVENS CAPSULE STUDY N/A 08/06/2013   INCOMPLETE-SMALL BOWLE ULCERS   IR FLUORO GUIDE CV LINE LEFT  07/29/2019  IR US GUIDE VASC ACCESS LEFT  07/29/2019   KNEE SURGERY Right    PARTIAL HYSTERECTOMY  1978   PORT-A-CATH REMOVAL Right 11/29/2020   Procedure: REMOVAL OF RIGHT CHEST PORT;  Surgeon: Rosetta Posner, MD;  Location: [place];  Service: Vascular;  Laterality: Right;   small bowel capsule  2008   negative   SPINE SURGERY     TONSILLECTOMY AND ADENOIDECTOMY     Two back surgeries/fusion     UMBILICAL HERNIA REPAIR  2010   UPPER EXTREMITY VENOGRAPHY N/A 11/22/2020   Procedure: UPPER EXTREMITY VENOGRAPHY;  Surgeon: Cherre Robins, MD;  Location: [place] CV LAB;  Service: Cardiovascular;  Laterality: N/A;     OB History   No obstetric history on file.     Family History  Problem Relation Age of Onset   Colon cancer Brother        diagnosed age 67. Living.    Ulcers Sister    Diabetes Sister    Heart attack Sister    Kidney failure Sister    Stroke Sister    Ulcers Mother    Diabetes Mother    Heart attack Mother    Stroke Mother    Asthma Mother    Heart disease Mother    Cervical cancer Mother    Heart attack Brother    Heart disease Brother     Asthma Sister    Diabetes Brother    Stroke Maternal Grandmother    Heart attack Maternal Grandmother    Heart attack Other    Early death Father        MVA in his 64s    Social History   Tobacco Use   Smoking status: Former    Packs/day: 1.00    Years: 1.00    Pack years: 1.00    Types: Cigarettes    Start date: 02/19/1961    Quit date: 08/05/1961    Years since quitting: 59.8   Smokeless tobacco: Never   Tobacco comments:    1 year in her lifetime  Vaping Use   Vaping Use: Never used  Substance Use Topics   Alcohol use: Never   Drug use: Never    Home Medications Prior to Admission medications   Medication Sig Start Date End Date Taking? Authorizing Provider  allopurinol (ZYLOPRIM) 100 MG tablet Take 1 tablet (100 mg total) by mouth at bedtime. 03/18/21  Yes Aline August, MD  amiodarone (PACERONE) 200 MG tablet Take 1 tablet (200 mg total) by mouth in the morning. 01/15/21  Yes Dagoberto Ligas, PA-C  Biotin 5000 MCG TABS Take 5,000 mcg by mouth in the morning.   Yes [provider]  cefTRIAXone (ROCEPHIN) IVPB Inject 1 g into the vein daily. For 3 days   Yes [provider]  ferrous sulfate 325 (65 FE) MG EC tablet Take 325 mg by mouth at bedtime.   Yes [provider]  gabapentin (NEURONTIN) 100 MG capsule Take 100 mg by mouth at bedtime. 12/29/19  Yes [provider]  mirtazapine (REMERON) 7.5 MG tablet Take 7.5 mg by mouth at bedtime.   Yes [provider]  Multiple Vitamin (MULTIVITAMIN WITH MINERALS) TABS tablet Take 1 tablet by mouth daily.   Yes [provider]  acetaminophen (TYLENOL) 325 MG tablet Take 650 mg by mouth daily as needed for headache (pain).    [provider]  apixaban (ELIQUIS) 2.5 MG TABS tablet Take 2.5 mg by mouth 2 (two) times daily.  [provider]  atorvastatin (LIPITOR) 40 MG tablet Take 40 mg by mouth daily in the afternoon. 01/15/21   Dagoberto Ligas, PA-C   budesonide-formoterol (SYMBICORT) 160-4.5 MCG/ACT inhaler Inhale 1 puff into the lungs daily.    [provider]  Camphor-Menthol-Methyl Sal (SALONPAS) 3.08-10-08 % PTCH Place 1 patch onto the skin daily as needed (pain).    [provider]  Carboxymethylcellul-Glycerin (CLEAR EYES FOR DRY EYES) 1-0.25 % SOLN Place 1 drop into both eyes daily as needed (dry eys).    [provider]  Cholecalciferol (VITAMIN D3) 50 MCG (2000 UT) TABS Take 2,000 Units by mouth in the morning.    [provider]  esomeprazole (NEXIUM) 40 MG capsule Take 1 capsule (40 mg total) by mouth 2 (two) times daily before a meal. 04/20/21   Nita Sells, MD  ferric citrate (AURYXIA) 1 GM 210 MG(Fe) tablet Take 210 mg by mouth 3 (three) times daily with meals.    [provider]  iron sucrose in sodium chloride 0.9 % 100 mL Iron Sucrose (Venofer) 03/22/21 03/21/22  [provider]  Methoxy PEG-Epoetin Beta (MIRCERA IJ) Mircera 05/01/21 04/30/22  [provider]  nitroGLYCERIN (NITROSTAT) 0.4 MG SL tablet Place 0.4 mg under the tongue every 5 (five) minutes x 3 doses as needed for chest pain. 12/07/19   [provider]  OXYGEN Inhale 2 L into the lungs See admin instructions. Use every night and as needed during the day    [provider]  promethazine (PHENERGAN) 12.5 MG tablet Take 1 tablet (12.5 mg total) by mouth every 8 (eight) hours as needed for nausea or vomiting. 03/08/21 05/08/21  Eloise Harman, DO  traMADol (ULTRAM) 50 MG tablet Take 1 tablet (50 mg total) by mouth every 12 (twelve) hours as needed for moderate pain. 05/08/21   Barton Dubois, MD  Tuberculin PPD (TUBERSOL ID) Inject into the skin. 04/24/21   [provider]    Allergies    Patient has no known allergies.  Review of Systems   Review of Systems  Constitutional:  Negative for appetite change and fatigue.  HENT:  Negative for congestion, ear discharge and sinus  pressure.   Eyes:  Negative for discharge.  Respiratory:  Positive for shortness of breath. Negative for cough.   Cardiovascular:  Negative for chest pain.  Gastrointestinal:  Negative for abdominal pain and diarrhea.  Genitourinary:  Negative for frequency and hematuria.  Musculoskeletal:  Negative for back pain.  Skin:  Negative for rash.  Neurological:  Negative for seizures and headaches.  Psychiatric/Behavioral:  Negative for hallucinations.    Physical Exam Updated Vital Signs BP (!) 103/33   Pulse 93   Temp 99.1 F (37.3 C) (Rectal)   Resp (!) 21   Ht 4\' 8"  (1.422 m)   Wt 67.1 kg   LMP  (LMP Unknown)   SpO2 98%   BMI 33.18 kg/m   Physical Exam Vitals and nursing note reviewed.  Constitutional:      Appearance: She is well-developed.     Comments: Mildly sleepy  HENT:     Head: Normocephalic.     Nose: Nose normal.  Eyes:     General: No scleral icterus.    Conjunctiva/sclera: Conjunctivae normal.  Neck:     Thyroid: No thyromegaly.  Cardiovascular:     Rate and Rhythm: Normal rate and regular rhythm.     Heart sounds: No murmur heard.   No friction rub. No gallop.  Pulmonary:  Breath sounds: No stridor. Rales present. No wheezing.  Chest:     Chest wall: No tenderness.  Abdominal:     General: There is no distension.     Tenderness: There is no abdominal tenderness. There is no rebound.  Musculoskeletal:     Cervical back: Neck supple.     Comments: Significant edema and arm  Lymphadenopathy:     Cervical: No cervical adenopathy.  Skin:    Findings: No erythema or rash.     Comments: Grade 2 sacral decubitus  Neurological:     Mental Status: She is oriented to person, place, and time.     Motor: No abnormal muscle tone.     Coordination: Coordination normal.  Psychiatric:        Behavior: Behavior normal.    ED Results / Procedures / Treatments   Labs (all labs ordered are listed, but only abnormal results are displayed) Labs Reviewed   BASIC METABOLIC PANEL - Abnormal; Notable for the following components:      Result Value   BUN 56 (*)    Creatinine, Ser 7.56 (*)    GFR, Estimated 5 (*)    All other components within normal limits    EKG None  Radiology DG Chest Port 1 View  Result Date: 05/09/2021 CLINICAL DATA:  Shortness of breath EXAM: PORTABLE CHEST 1 VIEW COMPARISON:  05/17/2021 FINDINGS: Left IJ approach hemodialysis catheter is stable in positioning. Cardiomegaly. Atherosclerotic calcification of the aortic knob. Large hiatal hernia with associated bibasilar atelectasis. Small left pleural effusion. Probable skin fold projects at the periphery of the right lung base where there are lung markings seen extending beyond the area of vertical lucency. No evidence of pneumothorax. IMPRESSION: 1. Small left pleural effusion and bibasilar atelectasis. 2. Large hiatal hernia. 3. Probable skin fold projects at the periphery of the right lung base where there are lung markings seen extending beyond the area of vertical lucency. Electronically Signed   By: Davina Poke D.O.   On: 05/13/2021 13:03    Procedures Procedures   Medications Ordered in ED Medications - No data to display  ED Course  I have reviewed the triage vital signs and the nursing notes.  Pertinent labs & imaging results that were available during my care of the patient were reviewed by me and considered in my medical decision making (see chart for details).  Patient's chemistry unremarkable except for the renal disease.  Chest x-ray does show a pleural effusion.  I spoke with nephrology and the patient will get dialysis here today and possibly go back to the nursing home MDM Rules/Calculators/A&P                           Hypoxia and need for dialysis. Final Clinical Impression(s) / ED Diagnoses Final diagnoses:  None    Rx / DC Orders ED Discharge Orders     None        Milton Ferguson, MD 06/04/2021 1657

## 2021-05-22 NOTE — ED Notes (Signed)
ED Provider at bedside. 

## 2021-05-22 NOTE — Procedures (Signed)
   HEMODIALYSIS TREATMENT NOTE:   Catheter entry site is unremarkable.  Both cath limbs aspirate and flush without resistance, however catheter is unable to sustain prescribed flow of 400cc/m.  CXR reveals sharp turn into left IJ and possible kink?  Max Qb 300cc/m with stable pressures + line reversal.  HD initiated with Levophed infusing at 42mcg/m.  Infusion was titrated to max dose of 68mcg/min within the first 30 minutes of treatment.  Hypotension persisted and Albumin 25g was given twice.  She also took Midodrine prior to HD and dialysate was cooled to 35 deg.  Pt received Vancomycin loading dose in ED prior to transfer to ICU for dialysis.  No additional dose is warranted per Gerrit Friends.  Maxipime 2g was given at the end of HD.  3.5 hour session completed.  All blood was returned.  Net UF 2013 cc.      HBV data retrieved from [facility]: Hepatitis B Surface Ag NON REACTIVE NON REACTIVE  NON REACTIVE CM  NON REACTIVE CM  NON REACTIVE CM  NON REACTIVE   Comment: Performed at [facility], [place] [address] [place], [place] [address]  Resulting Agency  [facility] CLIN LAB [facility] CLIN LAB [place] [facility] CLIN LAB [facility] CLIN LAB         Specimen Collected: 05/07/21 21:16 Last Resulted: 05/08/21 22:38         Rockwell Alexandria, RN

## 2021-05-22 NOTE — H&P (Signed)
History and Physical  [name] YTK:[id number] DOB: April 07, 1946 DOA: 05/11/2021   PCP: [name], MD  Patient coming from: SNF & is not able to ambulate  Chief Complaint: SOB- missed HD  HPI: [name] is a 75 y.o. female with medical history significant for ESRD on HD on T/TH/S schedule, CAD, CHF, COPD, diabetes mellitus type 2, A. fib, GERD, large hiatal hernia, history of diverticulitis, hypertension, OSA, obesity, presents to the ED complaining of shortness of breath for the past 2 days with missed dialysis session, and AMS.  Unable to obtain history from patient due to lethargy/AMS. Son at bedside who gave most of the history.  Son stated that about 5 days ago patient was noted to be doing well, receiving PT at SNF, alert/awake.  Over the next few days patient was noted to be getting weaker, complaining of generalized pain especially in the sacral area, and some abdominal pain.  X-ray done noted constipation of which patient received laxative and had a bowel movement on 05/21/2021.  Son was also concerned about her left upper extremity which was noted to be more swollen with some erythema and painful.  Son had multiple complaints about SNF. Due to feeling of unwell/weakness, patient missed dialysis on 05/19/2021.  Patient was sent to the ED from SNF due to shortness of breath and AMS.  Patient was said to desaturate on room air to the 80s, and was placed on 2 L of O2, with sats in the high 90s.  Patient denies any chest pain, abdominal pain, nausea/vomiting, no focal neurologic deficits, no fever/chills, headaches.  Patient unable to ambulate.  ED Course:  Patient noted to be lethargic, unable to engage in meaningful conversation, moaning in pain.  Initial vital signs showed afebrile, BP of 104/56, sats 100% on 2 L nasal cannula, HR 98, RR 20.  Upon my examination, BP noted to drop to SBP 70s-80s, MAP around 50.  Labs showed WBC 18, hemoglobin 9.4 around baseline, BMP  fairly stable for ESRD patient, BC x2 pending, procalcitonin pending.  Troponin and EKG pending. COVID and flu done on 05/17/2021 negative.  Chest x-ray with small left pleural effusion, bibasilar atelectasis.  Nephrology and PCCM consulted.  Patient admitted for further management.    Review of Systems: Review of systems are otherwise negative   Past Medical History:  Diagnosis Date   Anemia    Blood transfusion without reported diagnosis    CAD (coronary artery disease)    Nonobstructive at cardiac catheterization 2000   Cataract    Cervical cancer ([place]) 1978   CHF (congestive heart failure) (HCC)    Chronic back pain    Chronic kidney disease    Class 2 obesity with body mass index (BMI) of 35 to 39.9 without comorbidity    Complication of anesthesia    hard to awaken with one back surgery - years ago, no problem since   COPD (chronic obstructive pulmonary disease) ([place])    Degenerative disc disease    DM (diabetes mellitus), type 2 with renal complications (HCC)    Dyslipidemia    Dyspnea    "due to COPD"   Dysrhythmia    a-fib   ESRD (end stage renal disease) on dialysis ([place])    TTHS- [address]   GERD (gastroesophageal reflux disease)    Gout    Hiatal hernia 07/27/2013   History of diverticulitis of colon    History of hiatal hernia    HTN (hypertension)  09/09/20 not currently on medication, is on medication for hypotension   Hyperlipidemia    Hypotension    Hypothyroidism, unspecified 04/23/2021   IBS (irritable bowel syndrome)    Iron deficiency anemia    Irritable bowel syndrome    Lumbar radiculopathy    MDD (major depressive disorder)    Mixed hyperlipidemia    Moderate major depression, single episode ([place]) 07/21/2019   Neuropathy    OSA (obstructive sleep apnea)    Osteoporosis    Ovarian cancer ([place]) 1978   patient denies. States this was cervical cancer   Oxygen deficiency    room air   PAF (paroxysmal atrial fibrillation) (HCC)     Pneumonia    Sleep apnea    bipap - oxygen 2 l to bipap.   Type 2 diabetes mellitus ([place])    Vitamin B deficiency 12/25/2009   Vitamin B12 deficiency    Past Surgical History:  Procedure Laterality Date   ABDOMINAL HYSTERECTOMY     APPLICATION OF WOUND VAC Right 01/15/2021   Procedure: APPLICATION OF WOUND VAC;  Surgeon: Cherre Robins, MD;  Location: [place];  Service: Vascular;  Laterality: Right;   AV FISTULA PLACEMENT Left 08/05/2019   Procedure: ARTERIOVENOUS (AV) FISTULA CREATION LEFT ARM;  Surgeon: Waynetta Sandy, MD;  Location: [place];  Service: Vascular;  Laterality: Left;   AV FISTULA PLACEMENT Left 09/11/2020   Procedure: INSERTION OF ARTERIOVENOUS (AV) GORE-TEX GRAFT ARM LEFT;  Surgeon: Cherre Robins, MD;  Location: [place];  Service: Vascular;  Laterality: Left;   AV FISTULA PLACEMENT Right 11/29/2020   Procedure: Creation of Arteriovenous Fistula Right Upper Arm;  Surgeon: Rosetta Posner, MD;  Location: [place];  Service: Vascular;  Laterality: Right;   [place] Left 10/05/2019   Procedure: SECOND STAGE LEFT BASCILIC VEIN TRANSPOSITION;  Surgeon: Waynetta Sandy, MD;  Location: [place];  Service: Vascular;  Laterality: Left;   [place] Right 01/15/2021   Procedure: RIGHT UPPER ARM SECOND STAGE [place];  Surgeon: Cherre Robins, MD;  Location: [place];  Service: Vascular;  Laterality: Right;  PERIPHERAL NERVE BLOCK   Benign breast cysts     BIOPSY  02/26/2021   Procedure: BIOPSY;  Surgeon: Jackquline Denmark, MD;  Location: [facility] ENDOSCOPY;  Service: Endoscopy;;   CHOLECYSTECTOMY     COLONOSCOPY  10/01/2006   SLF:Pan colonic diverticulosis and moderate internal hemorrhoids/ Otherwise no polyps, masses, inflammatory changes or AVMs/   COLONOSCOPY  2011   SLF: pancolonic diverticulosis, large internal hemorrhoids   COLONOSCOPY N/A 01/26/2016   Procedure: COLONOSCOPY;  Surgeon: [name], MD;   [facility];  Service: Endoscopy;  Laterality: N/A;  830    COLONOSCOPY WITH PROPOFOL N/A 02/10/2020   Procedure: COLONOSCOPY WITH PROPOFOL;  Surgeon: Daneil Dolin, MD;  [facility];  Service: Endoscopy;  Laterality: N/A;  10:45am   ESOPHAGEAL DILATION  02/26/2021   Procedure: ESOPHAGEAL DILATION;  Surgeon: Jackquline Denmark, MD;  Location: [facility] ENDOSCOPY;  Service: Endoscopy;;   ESOPHAGOGASTRODUODENOSCOPY  11/19/2006   SLF: Large hiatal hernia without evidence of Cameron ulcers/. Distal esophageal stricture, which allowed the gastroscope to pass without resistance.  A 16 mm Savary later passed with mild resistance/ Normal stomach.sb bx negative   ESOPHAGOGASTRODUODENOSCOPY  10/01/2006   CWC:BJSEG hiatal hernia.  Distal esophagus without evidence of   erythema, ulceration or Barrett's esophagus   ESOPHAGOGASTRODUODENOSCOPY  2011   SLF:  large hh, distal esophageal web narrowing to 62mm s/p dilation to 10mm   ESOPHAGOGASTRODUODENOSCOPY N/A 08/06/2013   SLF: 1. Stricture at the gastroesophageal junction 2. large hiatal hernia. 3. Mild erosive gastritis.   ESOPHAGOGASTRODUODENOSCOPY (EGD) WITH PROPOFOL N/A 07/19/2019   rourk: Mild erosive reflux esophagitis.  Mild Schatzki ring status post dilation.  Large hiatal hernia with at least one half of the stomach above the diaphragm.  Gastric mucosa erythematous.   ESOPHAGOGASTRODUODENOSCOPY (EGD) WITH PROPOFOL N/A 02/26/2021   Procedure: ESOPHAGOGASTRODUODENOSCOPY (EGD) WITH PROPOFOL;  Surgeon: Jackquline Denmark, MD;  Location: [facility] ENDOSCOPY;  Service: Endoscopy;  Laterality: N/A;   GIVENS CAPSULE STUDY N/A 08/06/2013   INCOMPLETE-SMALL BOWLE ULCERS   IR FLUORO GUIDE CV LINE LEFT  07/29/2019   IR US GUIDE VASC ACCESS LEFT  07/29/2019   KNEE SURGERY Right    PARTIAL HYSTERECTOMY  1978   PORT-A-CATH REMOVAL Right 11/29/2020   Procedure: REMOVAL OF RIGHT CHEST PORT;  Surgeon: Rosetta Posner, MD;  Location: [place];  Service: Vascular;  Laterality:  Right;   small bowel capsule  2008   negative   SPINE SURGERY     TONSILLECTOMY AND ADENOIDECTOMY     Two back surgeries/fusion     UMBILICAL HERNIA REPAIR  2010   UPPER EXTREMITY VENOGRAPHY N/A 11/22/2020   Procedure: UPPER EXTREMITY VENOGRAPHY;  Surgeon: Cherre Robins, MD;  Location: [place] CV LAB;  Service: Cardiovascular;  Laterality: N/A;    Social History:  reports that she quit smoking about 59 years ago. Her smoking use included cigarettes. She started smoking about 60 years ago. She has a 1.00 pack-year smoking history. She has never used smokeless tobacco. She reports that she does not drink alcohol and does not use drugs.   No Known Allergies  Family History  Problem Relation Age of Onset   Colon cancer Brother        diagnosed age 93. Living.    Ulcers Sister    Diabetes Sister    Heart attack Sister    Kidney failure Sister    Stroke Sister    Ulcers Mother    Diabetes Mother    Heart attack Mother    Stroke Mother    Asthma Mother    Heart disease Mother    Cervical cancer Mother    Heart attack Brother    Heart disease Brother    Asthma Sister    Diabetes Brother    Stroke Maternal Grandmother    Heart attack Maternal Grandmother    Heart attack Other    Early death Father        MVA in his 45s      Prior to Admission medications   Medication Sig Start Date End Date Taking? Authorizing Provider  allopurinol (ZYLOPRIM) 100 MG tablet Take 1 tablet (100 mg total) by mouth at bedtime. 03/18/21  Yes Aline August, MD  amiodarone (PACERONE) 200 MG tablet Take 1 tablet (200 mg total) by mouth in the morning. 01/15/21  Yes Dagoberto Ligas, PA-C  Biotin 5000 MCG TABS Take 5,000 mcg by mouth in the morning.   Yes [provider]  cefTRIAXone (ROCEPHIN) IVPB Inject 1 g into the vein daily. For 3 days   Yes [provider]  ferrous sulfate 325 (65 FE) MG EC tablet Take 325 mg by mouth at bedtime.   Yes [provider]  gabapentin  (NEURONTIN) 100 MG capsule Take 100 mg by mouth at bedtime. 12/29/19  Yes [provider]  mirtazapine (REMERON) 7.5 MG tablet Take 7.5 mg by mouth at bedtime.   Yes [provider]  Multiple Vitamin (MULTIVITAMIN WITH MINERALS) TABS tablet Take 1 tablet by mouth daily.   Yes [provider]  acetaminophen (TYLENOL) 325 MG tablet Take 650 mg by mouth daily as needed for headache (pain).    [provider]  apixaban (ELIQUIS) 2.5 MG TABS tablet Take 2.5 mg by mouth 2 (two) times daily.    [provider]  atorvastatin (LIPITOR) 40 MG tablet Take 40 mg by mouth daily in the afternoon. 01/15/21   Dagoberto Ligas, PA-C  budesonide-formoterol (SYMBICORT) 160-4.5 MCG/ACT inhaler Inhale 1 puff into the lungs daily.    [provider]  Camphor-Menthol-Methyl Sal (SALONPAS) 3.08-10-08 % PTCH Place 1 patch onto the skin daily as needed (pain).    [provider]  Carboxymethylcellul-Glycerin (CLEAR EYES FOR DRY EYES) 1-0.25 % SOLN Place 1 drop into both eyes daily as needed (dry eys).    [provider]  Cholecalciferol (VITAMIN D3) 50 MCG (2000 UT) TABS Take 2,000 Units by mouth in the morning.    [provider]  esomeprazole (NEXIUM) 40 MG capsule Take 1 capsule (40 mg total) by mouth 2 (two) times daily before a meal. 04/20/21   Nita Sells, MD  ferric citrate (AURYXIA) 1 GM 210 MG(Fe) tablet Take 210 mg by mouth 3 (three) times daily with meals.    [provider]  iron sucrose in sodium chloride 0.9 % 100 mL Iron Sucrose (Venofer) 03/22/21 03/21/22  [provider]  Methoxy PEG-Epoetin Beta (MIRCERA IJ) Mircera 05/01/21 04/30/22  [provider]  nitroGLYCERIN (NITROSTAT) 0.4 MG SL tablet Place 0.4 mg under the tongue every 5 (five) minutes x 3 doses as needed for chest pain. 12/07/19   [provider]  OXYGEN Inhale 2 L into the lungs See admin instructions. Use every night and as needed  during the day    [provider]  promethazine (PHENERGAN) 12.5 MG tablet Take 1 tablet (12.5 mg total) by mouth every 8 (eight) hours as needed for nausea or vomiting. 03/08/21 05/08/21  Eloise Harman, DO  traMADol (ULTRAM) 50 MG tablet Take 1 tablet (50 mg total) by mouth every 12 (twelve) hours as needed for moderate pain. 05/08/21   Barton Dubois, MD  Tuberculin PPD (TUBERSOL ID) Inject into the skin. 04/24/21   [provider]    Physical Exam: BP (!) 82/20   Pulse 88   Temp 99.1 F (37.3 C) (Rectal)   Resp (!) 26   Ht 4\' 8"  (1.422 m)   Wt 67.1 kg   LMP  (LMP Unknown)   SpO2 99%   BMI 33.18 kg/m   General: Acutely ill appearing, lethargic, left-sided dialysis catheter noted Eyes: Normal ENT: Normal Neck: Supple Cardiovascular: S1, S2 present Respiratory: CTA B Abdomen: Soft, nontender, nondistended, bowel sounds present, noted bruising Skin: Noted multiple bruising Musculoskeletal: Left upper extremity with 3+ edema, noted erythema, pain, trace bilateral pedal edema Psychiatric: Unable to assess Neurologic: No obvious focal neurologic deficits noted, unable to do full neuro due to AMS          Labs on Admission:  Basic Metabolic Panel: Recent Labs  Lab 05/17/21 1529 05/07/2021 1243  NA 133* 135  K 3.2* 4.7  CL 98 101  CO2 26 23  GLUCOSE 86 88  BUN 20 56*  CREATININE 3.18* 7.56*  CALCIUM 8.4* 9.6   Liver Function Tests: Recent Labs  Lab 05/17/21 1529  AST 18  ALT 11  ALKPHOS 100  BILITOT 1.0  PROT 5.2*  ALBUMIN 1.8*   Recent Labs  Lab 05/17/21 1529  LIPASE 23   No results for input(s): AMMONIA in the last 168 hours. CBC: Recent Labs  Lab 05/17/21 1529 05/19/2021 1459  WBC 13.9* 18.0*  NEUTROABS 11.5* 16.2*  HGB 9.1* 9.4*  HCT 29.0* 28.3*  MCV 94.2 94.0  PLT 194 251   Cardiac Enzymes: No results for input(s): CKTOTAL, CKMB, CKMBINDEX, TROPONINI in the last 168 hours.  BNP (last 3 results) No results for input(s): BNP  in the last 8760 hours.  ProBNP (last 3 results) No results for input(s): PROBNP in the last 8760 hours.  CBG: No results for input(s): GLUCAP in the last 168 hours.  Radiological Exams on Admission: DG Chest Port 1 View  Result Date: 05/14/2021 CLINICAL DATA:  Shortness of breath EXAM: PORTABLE CHEST 1 VIEW COMPARISON:  05/17/2021 FINDINGS: Left IJ approach hemodialysis catheter is stable in positioning. Cardiomegaly. Atherosclerotic calcification of the aortic knob. Large hiatal hernia with associated bibasilar atelectasis. Small left pleural effusion. Probable skin fold projects at the periphery of the right lung base where there are lung markings seen extending beyond the area of vertical lucency. No evidence of pneumothorax. IMPRESSION: 1. Small left pleural effusion and bibasilar atelectasis. 2. Large hiatal hernia. 3. Probable skin fold projects at the periphery of the right lung base where there are lung markings seen extending beyond the area of vertical lucency. Electronically Signed   By: Davina Poke D.O.   On: 05/11/2021 13:03    EKG: Pending  Assessment/Plan Present on Admission:  SOB (shortness of breath)  Chronic respiratory failure with hypoxia (HCC)  DM type 2 with diabetic peripheral neuropathy (HCC)  Benign essential HTN  OSA (obstructive sleep apnea)  Principal Problem:   SOB (shortness of breath) Active Problems:   DM type 2 with diabetic peripheral neuropathy (HCC)   Benign essential HTN   OSA (obstructive sleep apnea)   Hiatal hernia with GERD and esophagitis   Chronic respiratory failure with hypoxia (HCC)   Possible sepsis On admission, leukocytosis, tachycardic HR greater than 90, later became hypotensive SBP in 70s-80s Etiology unknown, ?LUE cellulitis Vs ??dialysis catheter infection vs ?coliits Currently afebrile, with leukocytosis BC x2 pending, procalcitonin, LA pending Chest x-ray with small left pleural effusion, bibasilar atelectasis CT  abdomen pelvis done on 05/17/2021 showed diverticulosis, large hiatal hernia, infectious/inflammatory colitis cannot be excluded PCCM consulted, appreciate recs, started on Levophed Start IV vancomycin, cefepime Transfer patient to SDU/ICU for closer monitoring  Acute metabolic encephalopathy Son gave most of the history, patient not at baseline Etiology likely ??sepsis versus missed dialysis Management as above  LUE swelling Rule out cellulitis Multiple venous duplex have been negative Elevate arm IV antibiotics as above  ESRD on TTS SOB, missed HD session on 05/19/21 Currently saturating well on 2L Nephrology consulted for HD  Anemia of ESRD Hemoglobin at baseline Daily CBC  Diastolic HF Last ECHO on 9/20, with EF 65 to 70%, grade 1 DD, no RWMA Fluid management via HD  Diabetes mellitus type 2 Last A1c, 6 on 04/2020, repeat pending Not on any meds SSI, Accu-Cheks, hypoglycemic protocol     DVT prophylaxis: Apixaban  Code Status: Full  Family Communication: Son at bedside  Disposition Plan: Likely back to SNF  Consults called: Nephrology, PCCM  Admission status: Inpatient    Alma Friendly MD [facility]   If  7PM-7AM, please contact night-coverage [email or web address]  05/12/2021, 4:25 PM

## 2021-05-22 NOTE — ED Triage Notes (Signed)
Pt brought in by [facility] from [facility] with c/o AMS and difficulty breathing. BP 104/56, O2 sat 100% on 2L [place], HR 98.

## 2021-05-22 NOTE — ED Notes (Addendum)
Xray bedside.

## 2021-05-22 NOTE — Plan of Care (Signed)
  Problem: Education: Goal: Knowledge of General Education information will improve Description Including pain rating scale, medication(s)/side effects and non-pharmacologic comfort measures Outcome: Progressing   

## 2021-05-22 NOTE — Consult Note (Signed)
NAME:  [name], MRN:  [id number], DOB:  1946-07-03, LOS: 0 ADMISSION DATE:  05/29/2021, CONSULTATION DATE:  05/19/2021  REFERRING MD:  Horris Latino TRH, CHIEF COMPLAINT:  low BP   History of Present Illness:  75 year old ESRD on hemodialysis, nursing home resident, brought in for generalized weakness, shortness of breath mental status. History obtained from son [name] at the bedside and reviewing chart. Last dialysis was 5 days ago , she is a difficult access and dialyzed via left permacath ED visit noted 10/3, chronic left upper extremity swelling, duplex was negative Son also reports sacral decubitus , she was noted to desaturate today and sent to the emergency room, requiring 2 L oxygen.  Initial blood pressure was 469 systolic but then dropped to the 80s.  On my arrival again 103/88 Labs showed WBC count of 18 K with left shift, she is noted to have chronic leukocytosis, stable anemia, BUN and creatinine have increased  Pertinent  Medical History  ESRD on HD Diabetes type 2 Large hiatal hernia Hypertension OSA Atrial fibrillation COPD CAD, Echo 03/2021 normal LVEF grade 1 diastolic dysfunction  Significant Hospital Events: Including procedures, antibiotic start and stop dates in addition to other pertinent events     Interim History / Subjective:    Objective   Blood pressure (!) 82/20, pulse 88, temperature 99.1 F (37.3 C), temperature source Rectal, resp. rate (!) 26, height 4\' 8"  (1.422 m), weight 67.1 kg, SpO2 99 %.       No intake or output data in the 24 hours ending 05/09/2021 1617 Filed Weights   05/27/2021 1119  Weight: 67.1 kg    Examination: Gen. chronically ill-appearing obese, in no distress, lying on left side ENT - no pallor,icterus,moist mucosa Neck: No JVD, no thyromegaly, no carotid bruits Lungs: no use of accessory muscles, no dullness to percussion, decreased without rales or rhonchi  Cardiovascular: Rhythm regular, heart sounds  normal, no  murmurs or gallops, no peripheral edema Abdomen: soft and non-tender, no hepatosplenomegaly, BS normal. Musculoskeletal: No deformities, no cyanosis or clubbing , left arm swelling Neuro: Somnolent non focal, no tremors, moves all 4 extremities, follows one-step commands Skin-sacral decubitus stage II, no discharge   Resolved Hospital Problem list     Assessment & Plan:  She has a history of chronic hypotension on midodrine.  She also has chronic leukocytosis.  However we still have to consider sepsis in this dialysis patient with a left permacath.  Left arm appears to be chronically swollen and repeated venous duplex has been negative.  Recent echo 8/22 has not shown any evidence of pericardial effusion. EKG is pending but troponin is low. CT abdomen 05/17/2021 showed large hiatal hernia and some thickening of descending and sigmoid colon  Presumed sepsis -check lactate Reasonable to start peripheral Levophed to maintain MAP goal of 65 or systolic blood pressure of 90 .  If unable to obtain IV, d/w renal to access permacath Would use empiric antibiotics to cover line sepsis and infectious colitis, cefepime and bank should be okay Limited echo to rule out pericardial effusion  Acute encephalopathy -related to above  ESRD on HD -we will have to hold dialysis until blood pressure improves  Rest per primary team ,   Best Practice (right click and "Reselect all SmartList Selections" daily)   Diet/type: NPO w/ oral meds DVT prophylaxis: DOAC GI prophylaxis: N/A Lines: Dialysis Catheter Foley:  N/A Code Status:  full code Last date of multidisciplinary goals of care discussion [discussed with  son [name] and he requests full CODE STATUS and short-term life support]  Labs   CBC: Recent Labs  Lab 05/17/21 1529 05/08/2021 1459  WBC 13.9* 18.0*  NEUTROABS 11.5* 16.2*  HGB 9.1* 9.4*  HCT 29.0* 28.3*  MCV 94.2 94.0  PLT 194 161    Basic Metabolic Panel: Recent Labs  Lab  05/17/21 1529 06/02/2021 1243  NA 133* 135  K 3.2* 4.7  CL 98 101  CO2 26 23  GLUCOSE 86 88  BUN 20 56*  CREATININE 3.18* 7.56*  CALCIUM 8.4* 9.6   GFR: Estimated Creatinine Clearance: 4.9 mL/min (A) (by C-G formula based on SCr of 7.56 mg/dL (H)). Recent Labs  Lab 05/17/21 1529 05/07/2021 1459  WBC 13.9* 18.0*    Liver Function Tests: Recent Labs  Lab 05/17/21 1529  AST 18  ALT 11  ALKPHOS 100  BILITOT 1.0  PROT 5.2*  ALBUMIN 1.8*   Recent Labs  Lab 05/17/21 1529  LIPASE 23   No results for input(s): AMMONIA in the last 168 hours.  ABG    Component Value Date/Time   PHART 7.304 (L) 06/07/2019 1415   PCO2ART 65.6 (HH) 06/07/2019 1415   PO2ART 82.0 (L) 06/07/2019 1415   HCO3 28.2 (H) 06/07/2019 1415   TCO2 26 04/13/2021 1733   ACIDBASEDEF 3.8 (H) 01/30/2007 1830   O2SAT 95.0 06/07/2019 1415     Coagulation Profile: No results for input(s): INR, PROTIME in the last 168 hours.  Cardiac Enzymes: No results for input(s): CKTOTAL, CKMB, CKMBINDEX, TROPONINI in the last 168 hours.  HbA1C: Hgb A1c MFr Bld  Date/Time Value Ref Range Status  04/24/2020 06:44 PM 6.0 (H) 4.8 - 5.6 % Final    Comment:    (NOTE) Pre diabetes:          5.7%-6.4%  Diabetes:              >6.4%  Glycemic control for   <7.0% adults with diabetes   03/07/2020 04:57 AM 5.6 4.8 - 5.6 % Final    Comment:    (NOTE) Pre diabetes:          5.7%-6.4%  Diabetes:              >6.4%  Glycemic control for   <7.0% adults with diabetes     CBG: No results for input(s): GLUCAP in the last 168 hours.  Review of Systems:   Unable to obtain in detail since patient is somnolent  Past Medical History:  She,  has a past medical history of Anemia, Blood transfusion without reported diagnosis, CAD (coronary artery disease), Cataract, Cervical cancer ([place]) (1978), CHF (congestive heart failure) ([place]), Chronic back pain, Chronic kidney disease, Class 2 obesity with body mass index (BMI) of 35  to 39.9 without comorbidity, Complication of anesthesia, COPD (chronic obstructive pulmonary disease) ([place]), Degenerative disc disease, DM (diabetes mellitus), type 2 with renal complications ([place]), Dyslipidemia, Dyspnea, Dysrhythmia, ESRD (end stage renal disease) on dialysis ([place]), GERD (gastroesophageal reflux disease), Gout, Hiatal hernia (07/27/2013), History of diverticulitis of colon, History of hiatal hernia, HTN (hypertension), Hyperlipidemia, Hypotension, Hypothyroidism, unspecified (04/23/2021), IBS (irritable bowel syndrome), Iron deficiency anemia, Irritable bowel syndrome, Lumbar radiculopathy, MDD (major depressive disorder), Mixed hyperlipidemia, Moderate major depression, single episode ([place]) (07/21/2019), Neuropathy, OSA (obstructive sleep apnea), Osteoporosis, Ovarian cancer ([place]) (1978), Oxygen deficiency, PAF (paroxysmal atrial fibrillation) ([place]), Pneumonia, Sleep apnea, Type 2 diabetes mellitus ([place]), Vitamin B deficiency (12/25/2009), and Vitamin B12 deficiency.   Surgical History:   Past Surgical  History:  Procedure Laterality Date   ABDOMINAL HYSTERECTOMY     APPLICATION OF WOUND VAC Right 01/15/2021   Procedure: APPLICATION OF WOUND VAC;  Surgeon: Cherre Robins, MD;  Location: [place];  Service: Vascular;  Laterality: Right;   AV FISTULA PLACEMENT Left 08/05/2019   Procedure: ARTERIOVENOUS (AV) FISTULA CREATION LEFT ARM;  Surgeon: Waynetta Sandy, MD;  Location: [place];  Service: Vascular;  Laterality: Left;   AV FISTULA PLACEMENT Left 09/11/2020   Procedure: INSERTION OF ARTERIOVENOUS (AV) GORE-TEX GRAFT ARM LEFT;  Surgeon: Cherre Robins, MD;  Location: [place];  Service: Vascular;  Laterality: Left;   AV FISTULA PLACEMENT Right 11/29/2020   Procedure: Creation of Arteriovenous Fistula Right Upper Arm;  Surgeon: Rosetta Posner, MD;  Location: [place];  Service: Vascular;  Laterality: Right;   [place] Left 10/05/2019   Procedure:  SECOND STAGE LEFT BASCILIC VEIN TRANSPOSITION;  Surgeon: Waynetta Sandy, MD;  Location: [place];  Service: Vascular;  Laterality: Left;   [place] Right 01/15/2021   Procedure: RIGHT UPPER ARM SECOND STAGE [place];  Surgeon: Cherre Robins, MD;  Location: [place];  Service: Vascular;  Laterality: Right;  PERIPHERAL NERVE BLOCK   Benign breast cysts     BIOPSY  02/26/2021   Procedure: BIOPSY;  Surgeon: Jackquline Denmark, MD;  [facility]: [facility] ENDOSCOPY;  Service: Endoscopy;;   CHOLECYSTECTOMY     COLONOSCOPY  10/01/2006   SLF:Pan colonic diverticulosis and moderate internal hemorrhoids/ Otherwise no polyps, masses, inflammatory changes or AVMs/   COLONOSCOPY  2011   SLF: pancolonic diverticulosis, large internal hemorrhoids   COLONOSCOPY N/A 01/26/2016   Procedure: COLONOSCOPY;  Surgeon: [name], MD;  [facility];  Service: Endoscopy;  Laterality: N/A;  830    COLONOSCOPY WITH PROPOFOL N/A 02/10/2020   Procedure: COLONOSCOPY WITH PROPOFOL;  Surgeon: Daneil Dolin, MD;  [facility];  Service: Endoscopy;  Laterality: N/A;  10:45am   ESOPHAGEAL DILATION  02/26/2021   Procedure: ESOPHAGEAL DILATION;  Surgeon: Jackquline Denmark, MD;  Location: [facility] ENDOSCOPY;  Service: Endoscopy;;   ESOPHAGOGASTRODUODENOSCOPY  11/19/2006   SLF: Large hiatal hernia without evidence of Cameron ulcers/. Distal esophageal stricture, which allowed the gastroscope to pass without resistance.  A 16 mm Savary later passed with mild resistance/ Normal stomach.sb bx negative   ESOPHAGOGASTRODUODENOSCOPY  10/01/2006   DJM:EQAST hiatal hernia.  Distal esophagus without evidence of   erythema, ulceration or Barrett's esophagus   ESOPHAGOGASTRODUODENOSCOPY  2011   SLF: large hh, distal esophageal web narrowing to 37mm s/p dilation to 41mm   ESOPHAGOGASTRODUODENOSCOPY N/A 08/06/2013   SLF: 1. Stricture at the gastroesophageal junction 2. large hiatal hernia. 3. Mild  erosive gastritis.   ESOPHAGOGASTRODUODENOSCOPY (EGD) WITH PROPOFOL N/A 07/19/2019   rourk: Mild erosive reflux esophagitis.  Mild Schatzki ring status post dilation.  Large hiatal hernia with at least one half of the stomach above the diaphragm.  Gastric mucosa erythematous.   ESOPHAGOGASTRODUODENOSCOPY (EGD) WITH PROPOFOL N/A 02/26/2021   Procedure: ESOPHAGOGASTRODUODENOSCOPY (EGD) WITH PROPOFOL;  Surgeon: Jackquline Denmark, MD;  Location: [facility] ENDOSCOPY;  Service: Endoscopy;  Laterality: N/A;   GIVENS CAPSULE STUDY N/A 08/06/2013   INCOMPLETE-SMALL BOWLE ULCERS   IR FLUORO GUIDE CV LINE LEFT  07/29/2019   IR US GUIDE VASC ACCESS LEFT  07/29/2019   KNEE SURGERY Right    PARTIAL HYSTERECTOMY  1978   PORT-A-CATH REMOVAL Right 11/29/2020  Procedure: REMOVAL OF RIGHT CHEST PORT;  Surgeon: Rosetta Posner, MD;  Location: [place];  Service: Vascular;  Laterality: Right;   small bowel capsule  2008   negative   SPINE SURGERY     TONSILLECTOMY AND ADENOIDECTOMY     Two back surgeries/fusion     UMBILICAL HERNIA REPAIR  2010   UPPER EXTREMITY VENOGRAPHY N/A 11/22/2020   Procedure: UPPER EXTREMITY VENOGRAPHY;  Surgeon: Cherre Robins, MD;  Location: [place] CV LAB;  Service: Cardiovascular;  Laterality: N/A;     Social History:   reports that she quit smoking about 59 years ago. Her smoking use included cigarettes. She started smoking about 60 years ago. She has a 1.00 pack-year smoking history. She has never used smokeless tobacco. She reports that she does not drink alcohol and does not use drugs.   Family History:  Her family history includes Asthma in her mother and sister; Cervical cancer in her mother; Colon cancer in her brother; Diabetes in her brother, mother, and sister; Early death in her father; Heart attack in her brother, maternal grandmother, mother, sister, and another family member; Heart disease in her brother and mother; Kidney failure in her sister; Stroke in her maternal grandmother,  mother, and sister; Ulcers in her mother and sister.   Allergies No Known Allergies   Home Medications  Prior to Admission medications   Medication Sig Start Date End Date Taking? Authorizing Provider  allopurinol (ZYLOPRIM) 100 MG tablet Take 1 tablet (100 mg total) by mouth at bedtime. 03/18/21  Yes Aline August, MD  amiodarone (PACERONE) 200 MG tablet Take 1 tablet (200 mg total) by mouth in the morning. 01/15/21  Yes Dagoberto Ligas, PA-C  Biotin 5000 MCG TABS Take 5,000 mcg by mouth in the morning.   Yes [provider]  cefTRIAXone (ROCEPHIN) IVPB Inject 1 g into the vein daily. For 3 days   Yes [provider]  ferrous sulfate 325 (65 FE) MG EC tablet Take 325 mg by mouth at bedtime.   Yes [provider]  gabapentin (NEURONTIN) 100 MG capsule Take 100 mg by mouth at bedtime. 12/29/19  Yes [provider]  mirtazapine (REMERON) 7.5 MG tablet Take 7.5 mg by mouth at bedtime.   Yes [provider]  Multiple Vitamin (MULTIVITAMIN WITH MINERALS) TABS tablet Take 1 tablet by mouth daily.   Yes [provider]  acetaminophen (TYLENOL) 325 MG tablet Take 650 mg by mouth daily as needed for headache (pain).    [provider]  apixaban (ELIQUIS) 2.5 MG TABS tablet Take 2.5 mg by mouth 2 (two) times daily.    [provider]  atorvastatin (LIPITOR) 40 MG tablet Take 40 mg by mouth daily in the afternoon. 01/15/21   Dagoberto Ligas, PA-C  budesonide-formoterol (SYMBICORT) 160-4.5 MCG/ACT inhaler Inhale 1 puff into the lungs daily.    [provider]  Camphor-Menthol-Methyl Sal (SALONPAS) 3.08-10-08 % PTCH Place 1 patch onto the skin daily as needed (pain).    [provider]  Carboxymethylcellul-Glycerin (CLEAR EYES FOR DRY EYES) 1-0.25 % SOLN Place 1 drop into both eyes daily as needed (dry eys).    [provider]  Cholecalciferol (VITAMIN D3) 50 MCG (2000 UT) TABS Take 2,000 Units by mouth in the  morning.    [provider]  esomeprazole (NEXIUM) 40 MG capsule Take 1 capsule (40 mg total) by mouth 2 (two) times daily before a meal. 04/20/21   Nita Sells, MD  ferric  citrate (AURYXIA) 1 GM 210 MG(Fe) tablet Take 210 mg by mouth 3 (three) times daily with meals.    [provider]  iron sucrose in sodium chloride 0.9 % 100 mL Iron Sucrose (Venofer) 03/22/21 03/21/22  [provider]  Methoxy PEG-Epoetin Beta (MIRCERA IJ) Mircera 05/01/21 04/30/22  [provider]  nitroGLYCERIN (NITROSTAT) 0.4 MG SL tablet Place 0.4 mg under the tongue every 5 (five) minutes x 3 doses as needed for chest pain. 12/07/19   [provider]  OXYGEN Inhale 2 L into the lungs See admin instructions. Use every night and as needed during the day    [provider]  promethazine (PHENERGAN) 12.5 MG tablet Take 1 tablet (12.5 mg total) by mouth every 8 (eight) hours as needed for nausea or vomiting. 03/08/21 05/08/21  Eloise Harman, DO  traMADol (ULTRAM) 50 MG tablet Take 1 tablet (50 mg total) by mouth every 12 (twelve) hours as needed for moderate pain. 05/08/21   Barton Dubois, MD  Tuberculin PPD (TUBERSOL ID) Inject into the skin. 04/24/21   [provider]     Kara Mead MD. FCCP. [facility] Pager : [phone]  If no response to pager , please call [phone] until 7 pm After 7:00 pm call [place]  [phone]   05/19/2021

## 2021-05-22 NOTE — ED Notes (Signed)
Lab bedside.

## 2021-05-22 NOTE — Progress Notes (Signed)
eLink Physician-Brief Progress Note Patient Name: [name] DOB: [birth year] MRN: [id number]   Date of Service  05/10/2021  HPI/Events of Note  Pt with ESRD presented with leukocytosis, shock, and altered mentation  eICU Interventions  - in ICU getting HD, on levophed 36mcg/min     Intervention Category Evaluation Type: New Patient Evaluation  [place] 06/03/2021, 7:46 PM

## 2021-05-23 DIAGNOSIS — A419 Sepsis, unspecified organism: Secondary | ICD-10-CM | POA: Diagnosis not present

## 2021-05-23 DIAGNOSIS — N186 End stage renal disease: Secondary | ICD-10-CM | POA: Diagnosis not present

## 2021-05-23 DIAGNOSIS — L8990 Pressure ulcer of unspecified site, unspecified stage: Secondary | ICD-10-CM | POA: Insufficient documentation

## 2021-05-23 DIAGNOSIS — Z992 Dependence on renal dialysis: Secondary | ICD-10-CM | POA: Diagnosis not present

## 2021-05-23 DIAGNOSIS — R6521 Severe sepsis with septic shock: Secondary | ICD-10-CM | POA: Diagnosis not present

## 2021-05-23 DIAGNOSIS — I959 Hypotension, unspecified: Secondary | ICD-10-CM | POA: Diagnosis not present

## 2021-05-23 DIAGNOSIS — R0602 Shortness of breath: Secondary | ICD-10-CM | POA: Diagnosis not present

## 2021-05-23 LAB — CBC WITH DIFFERENTIAL/PLATELET
Abs Immature Granulocytes: 0.37 10*3/uL — ABNORMAL HIGH (ref 0.00–0.07)
Basophils Absolute: 0 10*3/uL (ref 0.0–0.1)
Basophils Relative: 0 %
Eosinophils Absolute: 0.1 10*3/uL (ref 0.0–0.5)
Eosinophils Relative: 1 %
HCT: 26.2 % — ABNORMAL LOW (ref 36.0–46.0)
Hemoglobin: 8.3 g/dL — ABNORMAL LOW (ref 12.0–15.0)
Immature Granulocytes: 2 %
Lymphocytes Relative: 6 %
Lymphs Abs: 1 10*3/uL (ref 0.7–4.0)
MCH: 30.2 pg (ref 26.0–34.0)
MCHC: 31.7 g/dL (ref 30.0–36.0)
MCV: 95.3 fL (ref 80.0–100.0)
Monocytes Absolute: 1.1 10*3/uL — ABNORMAL HIGH (ref 0.1–1.0)
Monocytes Relative: 6 %
Neutro Abs: 15.3 10*3/uL — ABNORMAL HIGH (ref 1.7–7.7)
Neutrophils Relative %: 85 %
Platelets: 203 10*3/uL (ref 150–400)
RBC: 2.75 MIL/uL — ABNORMAL LOW (ref 3.87–5.11)
RDW: 20.3 % — ABNORMAL HIGH (ref 11.5–15.5)
WBC: 17.9 10*3/uL — ABNORMAL HIGH (ref 4.0–10.5)
nRBC: 0.4 % — ABNORMAL HIGH (ref 0.0–0.2)

## 2021-05-23 LAB — BASIC METABOLIC PANEL
Anion gap: 12 (ref 5–15)
BUN: 20 mg/dL (ref 8–23)
CO2: 24 mmol/L (ref 22–32)
Calcium: 8.6 mg/dL — ABNORMAL LOW (ref 8.9–10.3)
Chloride: 97 mmol/L — ABNORMAL LOW (ref 98–111)
Creatinine, Ser: 3.77 mg/dL — ABNORMAL HIGH (ref 0.44–1.00)
GFR, Estimated: 12 mL/min — ABNORMAL LOW (ref >60)
Glucose, Bld: 146 mg/dL — ABNORMAL HIGH (ref 70–99)
Potassium: 3 mmol/L — ABNORMAL LOW (ref 3.5–5.1)
Sodium: 133 mmol/L — ABNORMAL LOW (ref 135–145)

## 2021-05-23 LAB — CORTISOL: Cortisol, Plasma: 29.2 ug/dL

## 2021-05-23 LAB — RESP PANEL BY RT-PCR (FLU A&B, COVID) ARPGX2
Influenza A by PCR: NEGATIVE
Influenza B by PCR: NEGATIVE
SARS Coronavirus 2 by RT PCR: NEGATIVE

## 2021-05-23 LAB — GLUCOSE, CAPILLARY
Glucose-Capillary: 134 mg/dL — ABNORMAL HIGH (ref 70–99)
Glucose-Capillary: 151 mg/dL — ABNORMAL HIGH (ref 70–99)
Glucose-Capillary: 176 mg/dL — ABNORMAL HIGH (ref 70–99)

## 2021-05-23 LAB — MRSA NEXT GEN BY PCR, NASAL: MRSA by PCR Next Gen: NOT DETECTED

## 2021-05-23 MED ORDER — COLLAGENASE 250 UNIT/GM EX OINT
1.0000 "application " | TOPICAL_OINTMENT | Freq: Every day | CUTANEOUS | Status: DC
  Administered 2021-05-23 – 2021-05-29 (×6): 1 via TOPICAL
  Filled 2021-05-23 (×2): qty 30

## 2021-05-23 MED ORDER — NOREPINEPHRINE 4 MG/250ML-% IV SOLN
0.0000 ug/min | INTRAVENOUS | Status: DC
  Administered 2021-05-23: 15 ug/min via INTRAVENOUS
  Administered 2021-05-23: 12 ug/min via INTRAVENOUS
  Administered 2021-05-23: 18 ug/min via INTRAVENOUS
  Administered 2021-05-24 (×2): 22 ug/min via INTRAVENOUS
  Filled 2021-05-23 (×6): qty 250

## 2021-05-23 MED ORDER — CYANOCOBALAMIN 1000 MCG/ML IJ SOLN
1000.0000 ug | Freq: Once | INTRAMUSCULAR | Status: AC
  Administered 2021-05-23: 1000 ug via INTRAMUSCULAR
  Filled 2021-05-23: qty 1

## 2021-05-23 MED ORDER — MIDODRINE HCL 5 MG PO TABS
15.0000 mg | ORAL_TABLET | Freq: Three times a day (TID) | ORAL | Status: DC
  Administered 2021-05-23 – 2021-05-27 (×14): 15 mg via ORAL
  Filled 2021-05-23 (×13): qty 3

## 2021-05-23 NOTE — Progress Notes (Signed)
PROGRESS NOTE    [name]  JME:[id number] DOB: 1946/02/13 DOA: 06/01/2021 PCP: [name], MD     Brief Narrative:  [name] is a 75 y.o. female with medical history significant for ESRD on HD on T/TH/S schedule, CAD, CHF, COPD, diabetes mellitus type 2, A. fib, GERD, large hiatal hernia, history of diverticulitis, hypertension, OSA, obesity, presents to the ED complaining of shortness of breath for the past 2 days with missed dialysis session and AMS.  Initially could not obtain history from patient due to lethargy/AMS. Son at bedside who gave most of the history.  Son stated that about 5 days ago patient was noted to be doing well, receiving PT at SNF, alert/awake.  Over the next few days patient was noted to be getting weaker, complaining of generalized pain especially in the sacral area, and some abdominal pain.  X-ray done noted constipation of which patient received laxative and had a bowel movement on 05/21/2021.  Son was also concerned about her left upper extremity which was noted to be more swollen with some erythema and painful.  Son had multiple complaints about SNF. Due to feeling of unwell/weakness, patient missed dialysis on 05/19/2021.  Patient was sent to the ED from SNF due to shortness of breath and AMS.  Patient was said to desaturate on room air to the 80s, and was placed on 2 L of O2, with sats in the high 90s. She was also found to be hypotensive and was started on levophed.   New events last 24 hours / Subjective: Patient alert and oriented to [facility], year 2022.  States that she felt "sick to her stomach" at SNF, but cannot go further into details on her presenting illness.  Patient remained hypotensive despite 44mcg/hr of Levophed running through peripheral line.  Discussed with Dr. Elsworth Soho, Dr. Carolin Sicks, Dr. Constance Haw.  Dr. Constance Haw was able to place a femoral line for increased dose of Levophed.  Dr. Carolin Sicks had recommended transferring patient to  [facility] if patient remains hypotensive for need for CRRT.  Assessment & Plan:   Principal Problem:   SOB (shortness of breath) Active Problems:   DM type 2 with diabetic peripheral neuropathy (HCC)   Benign essential HTN   OSA (obstructive sleep apnea)   Hiatal hernia with GERD and esophagitis   Chronic respiratory failure with hypoxia (HCC)   Sepsis (HCC)   Pressure injury of skin   Septic shock -Sepsis present on admission with HR 94, RR 20, WBC 18  -Possible etiology of left upper extremity cellulitis? -Continue Levophed -On empiric vancomycin, cefepime -Blood cultures pending -Check random cortisol  Acute metabolic encephalopathy -Alert and oriented x3 this morning  ESRD on HD TTS -Missed dialysis on 10/15 -Underwent dialysis in the hospital 10/18 -Nephrology following, recommended to transfer to [facility] if patient remains hypotensive for need for CRRT  Chronic hypotension -Midodrine dose was increased this morning  Chronic diastolic heart failure -Fluid management via hemodialysis  Diabetes mellitus type 2 -A1c pending  Vitamin B12 deficiency -Replete, check LFTs  A Fib -Continue amio, eliquis  HLD -Continue lipitor    In agreement with assessment of the pressure ulcer as below:  Pressure Injury 05/05/2021 Sacrum Lower;Mid Stage 3 -  Full thickness tissue loss. Subcutaneous fat may be visible but bone, tendon or muscle are NOT exposed. 7.5 cm x 2.5cm covered with yellow slough (Active)  05/07/2021 1900  Location: Sacrum  Location Orientation: Lower;Mid  Staging: Stage 3 -  Full thickness tissue loss. Subcutaneous fat may be visible but bone, tendon or muscle are NOT exposed.  Wound Description (Comments): 7.5 cm x 2.5cm covered with yellow slough  Present on Admission: Yes         DVT prophylaxis:  SCDs Start: 05/13/2021 1945 apixaban (ELIQUIS) tablet 2.5 mg Start: 05/06/2021 1415 apixaban (ELIQUIS) tablet 2.5 mg  Code Status:      Code Status Orders  (From admission, onward)           Start     Ordered   05/15/2021 1945  Full code  Continuous        05/27/2021 1944           Code Status History     Date Active Date Inactive Code Status Order ID Comments User Context   05/08/2021 0147 05/09/2021 0157 Full Code [id number]  Pegram, [place], DO ED   04/13/2021 2313 04/22/2021 0417 Full Code [id number]  Rama, Venetia Maxon, MD ED   03/13/2021 1615 03/18/2021 1836 Full Code [id number]  Orma Flaming, MD Inpatient   02/25/2021 1647 02/28/2021 2133 Full Code [id number]  Norval Morton, MD ED   07/20/2020 0546 07/27/2020 2247 Full Code [id number]  [name], MD ED   04/24/2020 1652 04/29/2020 0007 Full Code [id number]  Karmen Bongo, MD ED   03/06/2020 1025 03/08/2020 2327 Full Code [id number]  Vernelle Emerald, MD ED   07/25/2019 0013 08/12/2019 0014 Full Code [id number]  Jacalyn Lefevre, MD Inpatient   07/17/2019 1755 07/25/2019 0013 Full Code [id number]  Barton Dubois, MD ED   07/05/2019 2046 07/08/2019 2221 Full Code [id number]  Bethena Roys, MD Inpatient   06/15/2019 2121 06/21/2019 1929 Full Code [id number]  Vashti Hey, MD ED   06/01/2019 0115 06/08/2019 1808 Full Code [id number]  Vianne Bulls, MD ED   07/12/2018 1724 07/15/2018 2034 Full Code [id number]  Bethena Roys, MD Inpatient   05/08/2016 0241 05/10/2016 2119 Full Code [id number]  Vianne Bulls, MD ED   06/27/2015 2355 06/28/2015 1640 Full Code [id number]  Oswald Hillock, MD Inpatient   06/30/2013 1305 07/07/2013 1856 Full Code [id number]  Donne Hazel, MD ED      Family Communication: None at bedside, updated son [name] over the phone this morning on potential transfer [facility]  Disposition Plan:  Status is: Inpatient  Remains inpatient appropriate because: Hemodynamically unstable      Consultants:  Critical care Nephrology  Procedures:  Femoral central line placed by Dr. Constance Haw 10/19  Antimicrobials:   Anti-infectives (From admission, onward)    Start     Dose/Rate Route Frequency Ordered Stop   05/24/21 1600  ceFEPIme (MAXIPIME) 2 g in sodium chloride 0.9 % 100 mL IVPB        2 g 200 mL/hr over 30 Minutes Intravenous Every T-Th-Sa (Hemodialysis) 05/17/2021 1627     05/24/21 1600  vancomycin (VANCOREADY) IVPB 750 mg/150 mL        750 mg 150 mL/hr over 60 Minutes Intravenous Every T-Th-Sa (Hemodialysis) 05/05/2021 1628     05/29/2021 1700  vancomycin (VANCOREADY) IVPB 1500 mg/300 mL        1,500 mg 150 mL/hr over 120 Minutes Intravenous  Once 05/16/2021 1622 05/12/2021 1911   05/21/2021 1630  ceFEPIme (MAXIPIME) 2 g in sodium chloride 0.9 % 100 mL IVPB        2 g 200 mL/hr over 30 Minutes Intravenous  Once 05/08/2021 1622 06/02/2021 2310  Objective: Vitals:   05/23/21 1015 05/23/21 1030 05/23/21 1045 05/23/21 1100  BP: (!) 99/39 (!) 109/30 (!) 114/30 (!) 106/27  Pulse: 83 84 83 82  Resp: (!) 25 18 13 14   Temp: 99.1 F (37.3 C)     TempSrc: Oral     SpO2: 97% 98% 98% 99%  Weight:      Height:        Intake/Output Summary (Last 24 hours) at 05/23/2021 1124 Last data filed at 05/23/2021 0305 Gross per 24 hour  Intake 353 ml  Output 2013 ml  Net -1660 ml   Filed Weights   05/21/2021 1119 05/13/2021 1859  Weight: 67.1 kg 66.1 kg    Examination:  General exam: Appears calm  Respiratory system: Clear to auscultation anteriorly, respiratory effort remains normal Cardiovascular system: S1 & S2 heard, RRR. No murmurs. Gastrointestinal system: Abdomen is nondistended, soft  Central nervous system: Alert and oriented to person, place, year Extremities: Symmetric in appearance  Skin: + Stage III sacral ulcer, bilateral heel erythema without ulcer -all present on admission  Data Reviewed: I have personally reviewed following labs and imaging studies  CBC: Recent Labs  Lab 05/17/21 1529 06/03/2021 1459 05/23/21 0035  WBC 13.9* 18.0* 17.9*  NEUTROABS 11.5* 16.2* 15.3*  HGB 9.1*  9.4* 8.3*  HCT 29.0* 28.3* 26.2*  MCV 94.2 94.0 95.3  PLT 194 251 440   Basic Metabolic Panel: Recent Labs  Lab 05/17/21 1529 05/16/2021 1243 05/23/21 0819  NA 133* 135 133*  K 3.2* 4.7 3.0*  CL 98 101 97*  CO2 26 23 24   GLUCOSE 86 88 146*  BUN 20 56* 20  CREATININE 3.18* 7.56* 3.77*  CALCIUM 8.4* 9.6 8.6*   GFR: Estimated Creatinine Clearance: 9.7 mL/min (A) (by C-G formula based on SCr of 3.77 mg/dL (H)). Liver Function Tests: Recent Labs  Lab 05/17/21 1529  AST 18  ALT 11  ALKPHOS 100  BILITOT 1.0  PROT 5.2*  ALBUMIN 1.8*   Recent Labs  Lab 05/17/21 1529  LIPASE 23   No results for input(s): AMMONIA in the last 168 hours. Coagulation Profile: No results for input(s): INR, PROTIME in the last 168 hours. Cardiac Enzymes: No results for input(s): CKTOTAL, CKMB, CKMBINDEX, TROPONINI in the last 168 hours. BNP (last 3 results) No results for input(s): PROBNP in the last 8760 hours. HbA1C: No results for input(s): HGBA1C in the last 72 hours. CBG: Recent Labs  Lab 05/29/2021 1726 05/23/21 0826  GLUCAP 76 134*   Lipid Profile: No results for input(s): CHOL, HDL, LDLCALC, TRIG, CHOLHDL, LDLDIRECT in the last 72 hours. Thyroid Function Tests: No results for input(s): TSH, T4TOTAL, FREET4, T3FREE, THYROIDAB in the last 72 hours. Anemia Panel: Recent Labs    05/23/2021 1459  VITAMINB12 177*  FOLATE 11.5  FERRITIN 971*  TIBC NOT CALCULATED  IRON 44   Sepsis Labs: Recent Labs  Lab 05/09/2021 1656 06/01/2021 1713  PROCALCITON  --  1.31  LATICACIDVEN 0.5  --     Recent Results (from the past 240 hour(s))  Resp Panel by RT-PCR (Flu A&B, Covid) Nasopharyngeal Swab     Status: None   Collection Time: 05/17/21  3:30 PM   Specimen: Nasopharyngeal Swab; Nasopharyngeal(NP) swabs in vial transport medium  Result Value Ref Range Status   SARS Coronavirus 2 by RT PCR NEGATIVE NEGATIVE Final    Comment: (NOTE) SARS-CoV-2 target nucleic acids are NOT DETECTED.  The  SARS-CoV-2 RNA is generally detectable in upper respiratory specimens during  the acute phase of infection. The lowest concentration of SARS-CoV-2 viral copies this assay can detect is 138 copies/mL. A negative result does not preclude SARS-Cov-2 infection and should not be used as the sole basis for treatment or other patient management decisions. A negative result may occur with  improper specimen collection/handling, submission of specimen other than nasopharyngeal swab, presence of viral mutation(s) within the areas targeted by this assay, and inadequate number of viral copies(<138 copies/mL). A negative result must be combined with clinical observations, patient history, and epidemiological information. The expected result is Negative.  Fact Sheet for Patients:  EntrepreneurPulse.com.au  Fact Sheet for Healthcare Providers:  IncredibleEmployment.be  This test is no t yet approved or cleared by the [place] FDA and  has been authorized for detection and/or diagnosis of SARS-CoV-2 by FDA under an Emergency Use Authorization (EUA). This EUA will remain  in effect (meaning this test can be used) for the duration of the COVID-19 declaration under Section 564(b)(1) of the Act, 21 U.S.C.section 360bbb-3(b)(1), unless the authorization is terminated  or revoked sooner.       Influenza A by PCR NEGATIVE NEGATIVE Final   Influenza B by PCR NEGATIVE NEGATIVE Final    Comment: (NOTE) The Xpert Xpress SARS-CoV-2/FLU/RSV plus assay is intended as an aid in the diagnosis of influenza from Nasopharyngeal swab specimens and should not be used as a sole basis for treatment. Nasal washings and aspirates are unacceptable for Xpert Xpress SARS-CoV-2/FLU/RSV testing.  Fact Sheet for Patients: EntrepreneurPulse.com.au  Fact Sheet for Healthcare Providers: IncredibleEmployment.be  This test is not yet approved or  cleared by the [place] FDA and has been authorized for detection and/or diagnosis of SARS-CoV-2 by FDA under an Emergency Use Authorization (EUA). This EUA will remain in effect (meaning this test can be used) for the duration of the COVID-19 declaration under Section 564(b)(1) of the Act, 21 U.S.C. section 360bbb-3(b)(1), unless the authorization is terminated or revoked.  Performed at [facility], [address] [place], [place] [address]   Culture, blood (routine x 2)     Status: None (Preliminary result)   Collection Time: 05/26/2021  4:48 PM   Specimen: Right Antecubital; Blood  Result Value Ref Range Status   Specimen Description RIGHT ANTECUBITAL  Final   Special Requests   Final    BOTTLES DRAWN AEROBIC AND ANAEROBIC Blood Culture results may not be optimal due to an excessive volume of blood received in culture bottles   Culture   Final    NO GROWTH < 24 HOURS Performed at [facility], [address] [place], [place] [address]    Report Status PENDING  Incomplete  MRSA Next Gen by PCR, Nasal     Status: None   Collection Time: 05/12/2021  7:00 PM   Specimen: Nasal Mucosa; Nasal Swab  Result Value Ref Range Status   MRSA by PCR Next Gen NOT DETECTED NOT DETECTED Final    Comment: (NOTE) The GeneXpert MRSA Assay (FDA approved for NASAL specimens only), is one component of a comprehensive MRSA colonization surveillance program. It is not intended to diagnose MRSA infection nor to guide or monitor treatment for MRSA infections. Test performance is not FDA approved in patients less than 71 years old. Performed at [facility], [address], [place], [place] [address]   Culture, blood (routine x 2)     Status: None (Preliminary result)   Collection Time: 05/23/21 12:35 AM   Specimen: BLOOD  Result Value Ref Range Status  Specimen Description BLOOD BLOOD RIGHT HAND  Final   Special Requests   Final    BOTTLES DRAWN AEROBIC AND ANAEROBIC Blood Culture adequate volume    Culture   Final    NO GROWTH < 12 HOURS Performed at [facility], [address], [place], [place] [address]    Report Status PENDING  Incomplete      Radiology Studies: DG Chest Port 1 View  Result Date: 05/20/2021 CLINICAL DATA:  Shortness of breath EXAM: PORTABLE CHEST 1 VIEW COMPARISON:  05/17/2021 FINDINGS: Left IJ approach hemodialysis catheter is stable in positioning. Cardiomegaly. Atherosclerotic calcification of the aortic knob. Large hiatal hernia with associated bibasilar atelectasis. Small left pleural effusion. Probable skin fold projects at the periphery of the right lung base where there are lung markings seen extending beyond the area of vertical lucency. No evidence of pneumothorax. IMPRESSION: 1. Small left pleural effusion and bibasilar atelectasis. 2. Large hiatal hernia. 3. Probable skin fold projects at the periphery of the right lung base where there are lung markings seen extending beyond the area of vertical lucency. Electronically Signed   By: Davina Poke D.O.   On: 05/08/2021 13:03      Scheduled Meds:  allopurinol  100 mg Oral QHS   amiodarone  200 mg Oral q AM   apixaban  2.5 mg Oral BID   atorvastatin  40 mg Oral q1800   Chlorhexidine Gluconate Cloth  6 each Topical Q0600   collagenase  1 application Topical Daily   ferrous sulfate  325 mg Oral QHS   gabapentin  100 mg Oral QHS   insulin aspart  0-6 Units Subcutaneous TID WC   midodrine  15 mg Oral TID WC   mirtazapine  7.5 mg Oral QHS   mometasone-formoterol  2 puff Inhalation BID   pantoprazole  80 mg Oral BID AC   Continuous Infusions:  sodium chloride     sodium chloride     sodium chloride 250 mL (05/23/2021 1710)   [START ON 05/24/2021] ceFEPime (MAXIPIME) IV     norepinephrine (LEVOPHED) Adult infusion 12 mcg/min (05/23/21 1022)   [START ON 05/24/2021] vancomycin       LOS: 1 day      Time spent: 50 minutes   Dessa Phi, DO [facility] 05/23/2021, 11:24 AM    Available via Epic secure chat 7am-7pm After these hours, please refer to coverage provider listed on [email or web address]

## 2021-05-23 NOTE — Consult Note (Signed)
I have placed a request via Secure Chat to Dr. Maylene Roes requesting photos of the wound areas of concern to be placed in the EMR.    [place], [place], [place]

## 2021-05-23 NOTE — Progress Notes (Signed)
NAME:  [name], MRN:  [id number], DOB:  [birth year], LOS: 1 ADMISSION DATE:  05/24/2021, CONSULTATION DATE:  05/23/2021  REFERRING MD:  Horris Latino TRH, CHIEF COMPLAINT:  low BP   History of Present Illness:  75 year old ESRD on hemodialysis, nursing home resident, brought in for generalized weakness, shortness of breath mental status. History obtained from son [name] at the bedside and reviewing chart. Last dialysis was 5 days ago , she is a difficult access and dialyzed via left permacath ED visit noted 10/3, chronic left upper extremity swelling, duplex was negative Son also reports sacral decubitus , she was noted to desaturate today and sent to the emergency room, requiring 2 L oxygen.  Initial blood pressure was 950 systolic but then dropped to the 80s.  On my arrival again 103/88 Labs showed WBC count of 18 K with left shift, she is noted to have chronic leukocytosis, stable anemia, BUN and creatinine have increased  Pertinent  Medical History  ESRD on HD Diabetes type 2 Large hiatal hernia Hypertension OSA Atrial fibrillation COPD CAD, Echo 03/2021 normal LVEF grade 1 diastolic dysfunction  Significant Hospital Events: Including procedures, antibiotic start and stop dates in addition to other pertinent events   10/18 hypotensive, started on Levophed peripheral , dialyzed  Interim History / Subjective:   Remains hypotensive up to 10 mics of Levophed Critically ill Afebrile   Objective   Blood pressure (!) 123/35, pulse 86, temperature 98.7 F (37.1 C), temperature source Axillary, resp. rate (!) 22, height 4\' 8"  (1.422 m), weight 66.1 kg, SpO2 99 %.        Intake/Output Summary (Last 24 hours) at 05/23/2021 1323 Last data filed at 05/23/2021 0305 Gross per 24 hour  Intake 353 ml  Output 2013 ml  Net -1660 ml   Filed Weights   05/28/2021 1119 06/02/2021 1859  Weight: 67.1 kg 66.1 kg    Examination: Gen. chronically ill-appearing obese, in no distress,  lying supine in bed ENT - no pallor,icterus,moist mucosa Neck: No JVD, no thyromegaly, no carotid bruits Lungs: no use of accessory muscles, no dullness to percussion, decreased without rales or rhonchi  Cardiovascular: Rhythm regular, heart sounds  normal, no murmurs or gallops, no peripheral edema Abdomen: soft and non-tender, no hepatosplenomegaly, BS normal. Musculoskeletal: No deformities, no cyanosis or clubbing , left arm swelling, tender to touch Neuro: Somnolent but follows one-step commands and is nonfocal Skin-sacral decubitus stage II, no discharge   Labs show hypokalemia and decreased BUN, persistent leukocytosis Normal lactate  Resolved Hospital Problem list     Assessment & Plan:  She has a history of chronic hypotension on midodrine.  She also has chronic leukocytosis.  However we still have to consider sepsis in this dialysis patient with a left permacath.  Left arm appears to be chronically swollen and repeated venous duplex has been negative.  Recent echo 8/22 has not shown any evidence of pericardial effusion. She does not seem to be having acute coronary syndrome CT abdomen 05/17/2021 showed large hiatal hernia and some thickening of descending and sigmoid colon  Presumed sepsis POA -cultures negative so far Would continue empiric antibiotics to cover line sepsis and infectious colitis, cefepime and vancomycin 10/19 Femoral line placed by surgery Midodrine dose increased to 15mg   3 times daily   Acute encephalopathy -related to above, resolving Atrial fibrillation -on amiodarone and Eliquis  ESRD on HD -Next due for 10/20, if remains on pressors then can transfer [facility]  Suggest involve  palliative care for goals of care discussion given difficult access issues and chronic hypertension, son indicated that he would be okay with short-term life support   Best Practice (right click and "Reselect all SmartList Selections" daily)   Diet/type: dysphagia  diet (see orders) DVT prophylaxis: DOAC GI prophylaxis: N/A Lines: Central line and Dialysis Catheter Foley:  N/A Code Status:  full code Last date of multidisciplinary goals of care discussion [discussed with son [name] and he requests full CODE STATUS and short-term life support]  Labs   CBC: Recent Labs  Lab 05/17/21 1529 05/16/2021 1459 05/23/21 0035  WBC 13.9* 18.0* 17.9*  NEUTROABS 11.5* 16.2* 15.3*  HGB 9.1* 9.4* 8.3*  HCT 29.0* 28.3* 26.2*  MCV 94.2 94.0 95.3  PLT 194 251 203     Basic Metabolic Panel: Recent Labs  Lab 05/17/21 1529 05/18/2021 1243 05/23/21 0819  NA 133* 135 133*  K 3.2* 4.7 3.0*  CL 98 101 97*  CO2 26 23 24   GLUCOSE 86 88 146*  BUN 20 56* 20  CREATININE 3.18* 7.56* 3.77*  CALCIUM 8.4* 9.6 8.6*    GFR: Estimated Creatinine Clearance: 9.7 mL/min (A) (by C-G formula based on SCr of 3.77 mg/dL (H)). Recent Labs  Lab 05/17/21 1529 05/17/2021 1459 06/04/2021 1656 05/05/2021 1713 05/23/21 0035  PROCALCITON  --   --   --  1.31  --   WBC 13.9* 18.0*  --   --  17.9*  LATICACIDVEN  --   --  0.5  --   --      Liver Function Tests: Recent Labs  Lab 05/17/21 1529  AST 18  ALT 11  ALKPHOS 100  BILITOT 1.0  PROT 5.2*  ALBUMIN 1.8*    Recent Labs  Lab 05/17/21 1529  LIPASE 23    No results for input(s): AMMONIA in the last 168 hours.  ABG    Component Value Date/Time   PHART 7.398 05/09/2021 1725   PCO2ART 40.6 05/05/2021 1725   PO2ART 97.8 05/11/2021 1725   HCO3 24.6 05/23/2021 1725   TCO2 26 04/13/2021 1733   ACIDBASEDEF 3.8 (H) 01/30/2007 1830   O2SAT 97.3 05/25/2021 1725      My independent critical care time x 31 m  Kara Mead MD. FCCP. [facility] Pager : [phone]  If no response to pager , please call [phone] until 7 pm After 7:00 pm call Elink  [phone]   05/23/2021

## 2021-05-23 NOTE — Procedures (Addendum)
Procedure Note  05/23/21    Preoperative Diagnosis: Hypotension, septic shock, end stage renal disease    Postoperative Diagnosis: Same   Procedure(s) Performed: Central Line placement, left femoral line    Surgeon: Lanell Matar. Constance Haw, MD   Assistants: None   Anesthesia: 1% lidocaine    Complications: None    Indications: Ms. Sitzer is a 75 y.o. with hypotension, septic shock and on pressors who needs a central line for access. Due to her permacath I will not be able to do an upper line, and will need to do a femoral line. I discussed the risk and benefits of placement of the central line with her and her son, including but not limited to bleeding, infection, and injury to her vessels. They have given consent for the procedure.    Procedure: The patient placed supine. The left groin was prepped and draped in the usual sterile fashion.  Wearing full gown and gloves, I performed the procedure.  One percent lidocaine was used for local anesthesia. An ultrasound was utilized to assess the femoral vein.  The needle with syringe was advanced into the vein with dark venous return, and a wire was placed using the Seldinger technique without difficulty.  The skin was knicked and a dilator was placed, and the three lumen catheter was placed over the wire with continued control of the wire.  There was good draw back of blood from all three lumens and each flushed easily with saline.  The catheter was secured in 3 points with 2-0 silk and a biopatch and dressing was placed.     The patient tolerated the procedure well, and the CXR was ordered to confirm position of the central line.   Curlene Labrum, MD [facility] [address] [place], [place] [address] [phone] (office)

## 2021-05-23 NOTE — Consult Note (Signed)
[facility] Nurse wound consult note Consultation was completed by review of records, images and assistance from the bedside nurse/clinical staff.  Reason for Consult: sacral pressure injury Wound type: Unstageable Pressure Injury Pressure Injury POA: Yes Measurement: 2.5cm x 7.5cm x 0.1cm  Wound bed:80% yellow adherent slough/20% pink Drainage (amount, consistency, odor) serosanguinous, scant  Periwound: intact  Dressing procedure/placement/frequency:  Low air loss mattress while in the ICU; order air mattress if transferred to the floor Consult RD for supplementation for wound healing Enzymatic debridement for clearing of non viable tissue  Turn and reposition at least every 2 hours.  Pressure redistribution chair pad for when patient is up in the chair and to be sent to SNF for continued use.    Re consult if needed, will not follow at this time. Thanks  Shelton Soler [employer], RN,CWOCN, CNS, [place] [phone])

## 2021-05-23 NOTE — Consult Note (Addendum)
[facility] Nephrology Consult Note: Reason for Consult: To manage dialysis and dialysis related needs Referring Physician: Dr. Maylene Roes, [place]  HPI:  [name] is an 75 y.o. female with medical history of CAD, CHF, DM, COPD, acid reflux, A. fib, hypotension on midodrine, obesity, ESRD on HD TTS presented with shortness of breath generalized weakness and altered mental status, seen as a consultation to manage ESRD. It seems like patient was in skilled nursing facility where she was receiving physical therapy.  Over the last few days she was getting weaker and complaining of generalized pain over the sacral area and some abdominal discomfort.  The upper extremities were more swollen associated with erythema and painful.  She missed dialysis outpatient as well.  In the ER she was hypoxic to 80s require oxygen supplementation.  She was lethargic, unable to engage in conversation and moaning in pain.  The blood pressure dropped to 70s to 80s and the lab showed leukocytosis, anemia.  The blood cultures were sent.  The COVID and flu test was negative.  She was a started on Levophed and broad-spectrum antibiotic including cefepime and vancomycin and admitted to ICU. The patient had dialysis yesterday with the help of Levophed.  There was issue with blood flow.  The hypotension persisted during dialysis requiring 2 doses of albumin.  She is also on midodrine at home which was continued here.  She completed around 3.5 hours of treatment with net UF around 2 L.  She is afebrile and blood culture has been negative so far.  OP HD orders:  Dialyzes at  [facility], TTS, 4 hr, EDW 67 kg, 400/800 HD Bath 2k, 2.5 ca , Dialyzer 180optiflux, Heparin none. Access: Left IJ TDC. Mircera 200 mcg on 10/10//22 Vitamin D 1.25 mcg at HD Auryxia 2tabs with each meal Midodrine 10 mg TID  Past Medical History:  Diagnosis Date   Anemia    Blood transfusion without reported diagnosis    CAD  (coronary artery disease)    Nonobstructive at cardiac catheterization 2000   Cataract    Cervical cancer ([place]) 1978   CHF (congestive heart failure) (HCC)    Chronic back pain    Chronic kidney disease    Class 2 obesity with body mass index (BMI) of 35 to 39.9 without comorbidity    Complication of anesthesia    hard to awaken with one back surgery - years ago, no problem since   COPD (chronic obstructive pulmonary disease) ([place])    Degenerative disc disease    DM (diabetes mellitus), type 2 with renal complications ([place])    Dyslipidemia    Dyspnea    "due to COPD"   Dysrhythmia    a-fib   ESRD (end stage renal disease) on dialysis ([place])    TTHS- [address]   GERD (gastroesophageal reflux disease)    Gout    Hiatal hernia 07/27/2013   History of diverticulitis of colon    History of hiatal hernia    HTN (hypertension)    09/09/20 not currently on medication, is on medication for hypotension   Hyperlipidemia    Hypotension    Hypothyroidism, unspecified 04/23/2021   IBS (irritable bowel syndrome)    Iron deficiency anemia    Irritable bowel syndrome    Lumbar radiculopathy    MDD (major depressive disorder)    Mixed hyperlipidemia    Moderate major depression, single episode ([place]) 07/21/2019   Neuropathy    OSA (obstructive sleep apnea)  Osteoporosis    Ovarian cancer ([place]) 1978   patient denies. States this was cervical cancer   Oxygen deficiency    room air   PAF (paroxysmal atrial fibrillation) (HCC)    Pneumonia    Sleep apnea    bipap - oxygen 2 l to bipap.   Type 2 diabetes mellitus ([place])    Vitamin B deficiency 12/25/2009   Vitamin B12 deficiency     Past Surgical History:  Procedure Laterality Date   ABDOMINAL HYSTERECTOMY     APPLICATION OF WOUND VAC Right 01/15/2021   Procedure: APPLICATION OF WOUND VAC;  Surgeon: Cherre Robins, MD;  Location: [place];  Service: Vascular;  Laterality: Right;   AV FISTULA PLACEMENT Left 08/05/2019   Procedure:  ARTERIOVENOUS (AV) FISTULA CREATION LEFT ARM;  Surgeon: Waynetta Sandy, MD;  Location: [place];  Service: Vascular;  Laterality: Left;   AV FISTULA PLACEMENT Left 09/11/2020   Procedure: INSERTION OF ARTERIOVENOUS (AV) GORE-TEX GRAFT ARM LEFT;  Surgeon: Cherre Robins, MD;  Location: [place];  Service: Vascular;  Laterality: Left;   AV FISTULA PLACEMENT Right 11/29/2020   Procedure: Creation of Arteriovenous Fistula Right Upper Arm;  Surgeon: Rosetta Posner, MD;  Location: [place];  Service: Vascular;  Laterality: Right;   [place] Left 10/05/2019   Procedure: SECOND STAGE LEFT BASCILIC VEIN TRANSPOSITION;  Surgeon: Waynetta Sandy, MD;  Location: [place];  Service: Vascular;  Laterality: Left;   [place] Right 01/15/2021   Procedure: RIGHT UPPER ARM SECOND STAGE [place];  Surgeon: Cherre Robins, MD;  Location: [place];  Service: Vascular;  Laterality: Right;  PERIPHERAL NERVE BLOCK   Benign breast cysts     BIOPSY  02/26/2021   Procedure: BIOPSY;  Surgeon: Jackquline Denmark, MD;  Location: [facility] ENDOSCOPY;  Service: Endoscopy;;   CHOLECYSTECTOMY     COLONOSCOPY  10/01/2006   SLF:Pan colonic diverticulosis and moderate internal hemorrhoids/ Otherwise no polyps, masses, inflammatory changes or AVMs/   COLONOSCOPY  2011   SLF: pancolonic diverticulosis, large internal hemorrhoids   COLONOSCOPY N/A 01/26/2016   Procedure: COLONOSCOPY;  Surgeon: [name], MD;  [facility];  Service: Endoscopy;  Laterality: N/A;  830    COLONOSCOPY WITH PROPOFOL N/A 02/10/2020   Procedure: COLONOSCOPY WITH PROPOFOL;  Surgeon: Daneil Dolin, MD;  [facility];  Service: Endoscopy;  Laterality: N/A;  10:45am   ESOPHAGEAL DILATION  02/26/2021   Procedure: ESOPHAGEAL DILATION;  Surgeon: Jackquline Denmark, MD;  Location: [facility] ENDOSCOPY;  Service: Endoscopy;;   ESOPHAGOGASTRODUODENOSCOPY  11/19/2006   SLF: Large hiatal  hernia without evidence of Cameron ulcers/. Distal esophageal stricture, which allowed the gastroscope to pass without resistance.  A 16 mm Savary later passed with mild resistance/ Normal stomach.sb bx negative   ESOPHAGOGASTRODUODENOSCOPY  10/01/2006   GMW:NUUVO hiatal hernia.  Distal esophagus without evidence of   erythema, ulceration or Barrett's esophagus   ESOPHAGOGASTRODUODENOSCOPY  2011   SLF: large hh, distal esophageal web narrowing to 61mm s/p dilation to 66mm   ESOPHAGOGASTRODUODENOSCOPY N/A 08/06/2013   SLF: 1. Stricture at the gastroesophageal junction 2. large hiatal hernia. 3. Mild erosive gastritis.   ESOPHAGOGASTRODUODENOSCOPY (EGD) WITH PROPOFOL N/A 07/19/2019   rourk: Mild erosive reflux esophagitis.  Mild Schatzki ring status post dilation.  Large hiatal hernia with at least one half of the stomach above the diaphragm.  Gastric mucosa erythematous.   ESOPHAGOGASTRODUODENOSCOPY (EGD) WITH  PROPOFOL N/A 02/26/2021   Procedure: ESOPHAGOGASTRODUODENOSCOPY (EGD) WITH PROPOFOL;  Surgeon: Jackquline Denmark, MD;  Location: [facility] ENDOSCOPY;  Service: Endoscopy;  Laterality: N/A;   GIVENS CAPSULE STUDY N/A 08/06/2013   INCOMPLETE-SMALL BOWLE ULCERS   IR FLUORO GUIDE CV LINE LEFT  07/29/2019   IR US GUIDE VASC ACCESS LEFT  07/29/2019   KNEE SURGERY Right    PARTIAL HYSTERECTOMY  1978   PORT-A-CATH REMOVAL Right 11/29/2020   Procedure: REMOVAL OF RIGHT CHEST PORT;  Surgeon: Rosetta Posner, MD;  Location: [place];  Service: Vascular;  Laterality: Right;   small bowel capsule  2008   negative   SPINE SURGERY     TONSILLECTOMY AND ADENOIDECTOMY     Two back surgeries/fusion     UMBILICAL HERNIA REPAIR  2010   UPPER EXTREMITY VENOGRAPHY N/A 11/22/2020   Procedure: UPPER EXTREMITY VENOGRAPHY;  Surgeon: Cherre Robins, MD;  Location: [place] CV LAB;  Service: Cardiovascular;  Laterality: N/A;    Family History  Problem Relation Age of Onset   Colon cancer Brother        diagnosed age 52.  Living.    Ulcers Sister    Diabetes Sister    Heart attack Sister    Kidney failure Sister    Stroke Sister    Ulcers Mother    Diabetes Mother    Heart attack Mother    Stroke Mother    Asthma Mother    Heart disease Mother    Cervical cancer Mother    Heart attack Brother    Heart disease Brother    Asthma Sister    Diabetes Brother    Stroke Maternal Grandmother    Heart attack Maternal Grandmother    Heart attack Other    Early death Father        MVA in his 67s    Social History:  reports that she quit smoking about 59 years ago. Her smoking use included cigarettes. She started smoking about 60 years ago. She has a 1.00 pack-year smoking history. She has never used smokeless tobacco. She reports that she does not drink alcohol and does not use drugs.  Allergies: No Known Allergies  Medications: I have reviewed the patient's current medications.   Results for orders placed or performed during the hospital encounter of 05/21/2021 (from the past 48 hour(s))  Basic metabolic panel     Status: Abnormal   Collection Time: 05/09/2021 12:43 PM  Result Value Ref Range   Sodium 135 135 - 145 mmol/L   Potassium 4.7 3.5 - 5.1 mmol/L   Chloride 101 98 - 111 mmol/L   CO2 23 22 - 32 mmol/L   Glucose, Bld 88 70 - 99 mg/dL    Comment: Glucose reference range applies only to samples taken after fasting for at least 8 hours.   BUN 56 (H) 8 - 23 mg/dL   Creatinine, Ser 7.56 (H) 0.44 - 1.00 mg/dL   Calcium 9.6 8.9 - 10.3 mg/dL   GFR, Estimated 5 (L) >60 mL/min    Comment: (NOTE) Calculated using the CKD-EPI Creatinine Equation (2021)    Anion gap 11 5 - 15    Comment: Performed at [facility], [address] [place], [place] [address]  CBC with Differential/Platelet     Status: Abnormal   Collection Time: 05/07/2021  2:59 PM  Result Value Ref Range   WBC 18.0 (H) 4.0 - 10.5 K/uL   RBC 3.01 (L) 3.87 - 5.11 MIL/uL  Hemoglobin 9.4 (L) 12.0 - 15.0 g/dL   HCT 28.3 (L) 36.0 - 46.0 %    MCV 94.0 80.0 - 100.0 fL   MCH 31.2 26.0 - 34.0 pg   MCHC 33.2 30.0 - 36.0 g/dL   RDW 19.9 (H) 11.5 - 15.5 %   Platelets 251 150 - 400 K/uL   nRBC 0.3 (H) 0.0 - 0.2 %   Neutrophils Relative % 89 %   Neutro Abs 16.2 (H) 1.7 - 7.7 K/uL   Lymphocytes Relative 4 %   Lymphs Abs 0.7 0.7 - 4.0 K/uL   Monocytes Relative 5 %   Monocytes Absolute 0.8 0.1 - 1.0 K/uL   Eosinophils Relative 0 %   Eosinophils Absolute 0.0 0.0 - 0.5 K/uL   Basophils Relative 0 %   Basophils Absolute 0.1 0.0 - 0.1 K/uL   Immature Granulocytes 2 %   Abs Immature Granulocytes 0.27 (H) 0.00 - 0.07 K/uL    Comment: Performed at [facility], [address], [place], [place] [address]  Vitamin B12     Status: Abnormal   Collection Time: 05/24/2021  2:59 PM  Result Value Ref Range   Vitamin B-12 177 (L) 180 - 914 pg/mL    Comment: Performed at [facility], [address] [place], [place] [address]  Folate     Status: None   Collection Time: 05/07/2021  2:59 PM  Result Value Ref Range   Folate 11.5 >5.9 ng/mL    Comment: Performed at [facility], [address] [place], [place] [address]  Iron and TIBC     Status: None   Collection Time: 05/23/2021  2:59 PM  Result Value Ref Range   Iron 44 28 - 170 ug/dL   TIBC NOT CALCULATED 250 - 450 ug/dL   Saturation Ratios NOT CALCULATED 10.4 - 31.8 %   UIBC NOT CALCULATED ug/dL    Comment: Performed at [facility], [address], [place], [place] [address]  Ferritin     Status: Abnormal   Collection Time: 06/01/2021  2:59 PM  Result Value Ref Range   Ferritin 971 (H) 11 - 307 ng/mL    Comment: Performed at [facility], [address] [place], [place] [address]  Troponin I (High Sensitivity)     Status: None   Collection Time: 06/04/2021  2:59 PM  Result Value Ref Range   Troponin I (High Sensitivity) 16 <18 ng/L    Comment: (NOTE) Elevated high sensitivity troponin I (hsTnI) values and significant  changes across serial measurements may suggest ACS but many other  chronic  and acute conditions are known to elevate hsTnI results.  Refer to the "Links" section for chest pain algorithms and additional  guidance. Performed at [facility], [address] [place], [place] [address]   Troponin I (High Sensitivity)     Status: None   Collection Time: 05/06/2021  4:48 PM  Result Value Ref Range   Troponin I (High Sensitivity) 17 <18 ng/L    Comment: (NOTE) Elevated high sensitivity troponin I (hsTnI) values and significant  changes across serial measurements may suggest ACS but many other  chronic and acute conditions are known to elevate hsTnI results.  Refer to the "Links" section for chest pain algorithms and additional  guidance. Performed at [facility], [address] [place],  [address]   Culture, blood (routine x 2)     Status: None (Preliminary result)   Collection Time: 05/06/2021  4:48 PM   Specimen: Right Antecubital; Blood  Result Value Ref Range   Specimen Description RIGHT ANTECUBITAL    Special Requests      BOTTLES DRAWN AEROBIC AND ANAEROBIC Blood Culture results may not be optimal due to an excessive volume of blood received in culture bottles Performed at [facility], [address] [place], [place] [address]    Culture PENDING    Report Status PENDING   Lactic acid, plasma     Status: None   Collection Time: 05/11/2021  4:56 PM  Result Value Ref Range   Lactic Acid, Venous 0.5 0.5 - 1.9 mmol/L    Comment: Performed at [facility], [address] [place], [place] [address]  Procalcitonin - Baseline     Status: None   Collection Time: 06/02/2021  5:13 PM  Result Value Ref Range   Procalcitonin 1.31 ng/mL    Comment:        Interpretation: PCT > 0.5 ng/mL and <= 2 ng/mL: Systemic infection (sepsis) is possible, but other conditions are known to elevate PCT as well. (NOTE)       Sepsis PCT Algorithm           Lower Respiratory Tract                                      Infection PCT Algorithm    ----------------------------      ----------------------------         PCT < 0.25 ng/mL                PCT < 0.10 ng/mL          Strongly encourage             Strongly discourage   discontinuation of antibiotics    initiation of antibiotics    ----------------------------     -----------------------------       PCT 0.25 - 0.50 ng/mL            PCT 0.10 - 0.25 ng/mL               OR       >80% decrease in PCT            Discourage initiation of                                            antibiotics      Encourage discontinuation           of antibiotics    ----------------------------     -----------------------------         PCT >= 0.50 ng/mL              PCT 0.26 - 0.50 ng/mL                AND       <80% decrease in PCT             Encourage initiation of                                             antibiotics       Encourage continuation           of antibiotics    ----------------------------     -----------------------------  PCT >= 0.50 ng/mL                  PCT > 0.50 ng/mL               AND         increase in PCT                  Strongly encourage                                      initiation of antibiotics    Strongly encourage escalation           of antibiotics                                     -----------------------------                                           PCT <= 0.25 ng/mL                                                 OR                                        > 80% decrease in PCT                                      Discontinue / Do not initiate                                             antibiotics  Performed at [facility], [address] [place], [place] [address]   Blood gas, arterial     Status: None   Collection Time: 05/10/2021  5:25 PM  Result Value Ref Range   FIO2 30.00    pH, Arterial 7.398 7.350 - 7.450   pCO2 arterial 40.6 32.0 - 48.0 mmHg   pO2, Arterial 97.8 83.0 - 108.0 mmHg   Bicarbonate 24.6 20.0 - 28.0 mmol/L   Acid-Base Excess 0.2 0.0 - 2.0 mmol/L    O2 Saturation 97.3 %   Patient temperature 37.3    Allens test (pass/fail) PASS PASS    Comment: Performed at [facility], [address], [place], [place] [address]  CBG monitoring, ED     Status: None   Collection Time: 05/13/2021  5:26 PM  Result Value Ref Range   Glucose-Capillary 76 70 - 99 mg/dL    Comment: Glucose reference range applies only to samples taken after fasting for at least 8 hours.   Comment 1 Notify RN   MRSA Next Gen by PCR, Nasal     Status: None   Collection Time: 05/18/2021  7:00 PM   Specimen: Nasal Mucosa; Nasal Swab  Result Value Ref Range  MRSA by PCR Next Gen NOT DETECTED NOT DETECTED    Comment: (NOTE) The GeneXpert MRSA Assay (FDA approved for NASAL specimens only), is one component of a comprehensive MRSA colonization surveillance program. It is not intended to diagnose MRSA infection nor to guide or monitor treatment for MRSA infections. Test performance is not FDA approved in patients less than 19 years old. Performed at [facility], [address] [place], [place] [address]   Culture, blood (routine x 2)     Status: None (Preliminary result)   Collection Time: 05/23/21 12:35 AM   Specimen: BLOOD  Result Value Ref Range   Specimen Description BLOOD BLOOD RIGHT HAND    Special Requests      BOTTLES DRAWN AEROBIC AND ANAEROBIC Blood Culture adequate volume Performed at [facility], [address] [place], [place] [address]    Culture PENDING    Report Status PENDING   CBC with Differential/Platelet     Status: Abnormal   Collection Time: 05/23/21 12:35 AM  Result Value Ref Range   WBC 17.9 (H) 4.0 - 10.5 K/uL   RBC 2.75 (L) 3.87 - 5.11 MIL/uL   Hemoglobin 8.3 (L) 12.0 - 15.0 g/dL   HCT 26.2 (L) 36.0 - 46.0 %   MCV 95.3 80.0 - 100.0 fL   MCH 30.2 26.0 - 34.0 pg   MCHC 31.7 30.0 - 36.0 g/dL   RDW 20.3 (H) 11.5 - 15.5 %   Platelets 203 150 - 400 K/uL   nRBC 0.4 (H) 0.0 - 0.2 %   Neutrophils Relative % 85 %   Neutro Abs 15.3 (H) 1.7 - 7.7  K/uL   Lymphocytes Relative 6 %   Lymphs Abs 1.0 0.7 - 4.0 K/uL   Monocytes Relative 6 %   Monocytes Absolute 1.1 (H) 0.1 - 1.0 K/uL   Eosinophils Relative 1 %   Eosinophils Absolute 0.1 0.0 - 0.5 K/uL   Basophils Relative 0 %   Basophils Absolute 0.0 0.0 - 0.1 K/uL   Immature Granulocytes 2 %   Abs Immature Granulocytes 0.37 (H) 0.00 - 0.07 K/uL    Comment: Performed at [facility], [address], [place],  [address]    DG Chest Port 1 View  Result Date: 06/02/2021 CLINICAL DATA:  Shortness of breath EXAM: PORTABLE CHEST 1 VIEW COMPARISON:  05/17/2021 FINDINGS: Left IJ approach hemodialysis catheter is stable in positioning. Cardiomegaly. Atherosclerotic calcification of the aortic knob. Large hiatal hernia with associated bibasilar atelectasis. Small left pleural effusion. Probable skin fold projects at the periphery of the right lung base where there are lung markings seen extending beyond the area of vertical lucency. No evidence of pneumothorax. IMPRESSION: 1. Small left pleural effusion and bibasilar atelectasis. 2. Large hiatal hernia. 3. Probable skin fold projects at the periphery of the right lung base where there are lung markings seen extending beyond the area of vertical lucency. Electronically Signed   By: Davina Poke D.O.   On: 05/05/2021 13:03    ROS: As per H&P, rest of the systems are reviewed and negative.  Blood pressure (!) 88/19, pulse 74, temperature 98.6 F (37 C), temperature source Oral, resp. rate 15, height 4\' 8"  (1.422 m), weight 66.1 kg, SpO2 98 %. Gen: Ill-looking female lying on bed, able to recognize me. Respiratory: Distant breath sound, no increased work of breathing Cardiovascular: Regular rate rhythm S1-S2 normal, no rubs GI: Abdomen soft, nontender, Extremities, bilateral upper extremity edematous, no clubbing Skin: No rash or ulcer Neurology: Alert,  awake, following commands,  Dialysis Access: Left IJ HD catheter site looks  clean.  Assessment/Plan:  #Shock: Presumed sepsis given leukocytosis and back wound.  Currently on broad-spectrum antibiotics including cefepime and vancomycin.  The blood cultures negative so far.  Currently on Levophed with systolic blood pressure around 101.  She does have chronic hypotension as outpatient and on midodrine.  I will increase midodrine to 15 mg 3 times daily and try to wean off Levophed gradually.  Discussed with the primary team and Dr. Elsworth Soho from PCCM.    # ESRD: TTS schedule: Status post HD yesterday with around 2 L UF.  No need for dialysis today.  We will reassess in the morning.  If she remains hypotensive then she may need to go to [facility] for possible CRRT.  Discussed with the team.  # Anemia of ESRD: Hemoglobin below goal.  Received Mircera on 10/10.  Transfuse as needed.  # Metabolic Bone Disease: Monitor calcium phosphorus level.  #Hypokalemia: Managed with higher potassium bath on dialysis.  #Hyponatremia, hypervolemic: Attempt ultrafiltration during dialysis.  This has been challenging because of persistent hypotension.  Continue fluid restriction.  Thank you for the consult.  We will follow.  Darion Juhasz Tanna Furry 05/23/2021, 8:27 AM

## 2021-05-24 DIAGNOSIS — A419 Sepsis, unspecified organism: Secondary | ICD-10-CM | POA: Diagnosis not present

## 2021-05-24 DIAGNOSIS — E43 Unspecified severe protein-calorie malnutrition: Secondary | ICD-10-CM | POA: Insufficient documentation

## 2021-05-24 LAB — ALT: ALT: 11 U/L (ref 0–44)

## 2021-05-24 LAB — BILIRUBIN, TOTAL: Total Bilirubin: 1 mg/dL (ref 0.3–1.2)

## 2021-05-24 LAB — CBC WITH DIFFERENTIAL/PLATELET
Abs Immature Granulocytes: 0.29 10*3/uL — ABNORMAL HIGH (ref 0.00–0.07)
Basophils Absolute: 0 10*3/uL (ref 0.0–0.1)
Basophils Relative: 0 %
Eosinophils Absolute: 0 10*3/uL (ref 0.0–0.5)
Eosinophils Relative: 0 %
HCT: 27.8 % — ABNORMAL LOW (ref 36.0–46.0)
Hemoglobin: 9 g/dL — ABNORMAL LOW (ref 12.0–15.0)
Immature Granulocytes: 2 %
Lymphocytes Relative: 5 %
Lymphs Abs: 0.7 10*3/uL (ref 0.7–4.0)
MCH: 29.9 pg (ref 26.0–34.0)
MCHC: 32.4 g/dL (ref 30.0–36.0)
MCV: 92.4 fL (ref 80.0–100.0)
Monocytes Absolute: 1.3 10*3/uL — ABNORMAL HIGH (ref 0.1–1.0)
Monocytes Relative: 8 %
Neutro Abs: 14 10*3/uL — ABNORMAL HIGH (ref 1.7–7.7)
Neutrophils Relative %: 85 %
Platelets: 241 10*3/uL (ref 150–400)
RBC: 3.01 MIL/uL — ABNORMAL LOW (ref 3.87–5.11)
RDW: 19.2 % — ABNORMAL HIGH (ref 11.5–15.5)
WBC: 16.4 10*3/uL — ABNORMAL HIGH (ref 4.0–10.5)
nRBC: 0.3 % — ABNORMAL HIGH (ref 0.0–0.2)

## 2021-05-24 LAB — HEPATITIS B SURFACE ANTIBODY,QUALITATIVE: Hep B S Ab: NONREACTIVE

## 2021-05-24 LAB — RENAL FUNCTION PANEL
Albumin: 2.3 g/dL — ABNORMAL LOW (ref 3.5–5.0)
Anion gap: 11 (ref 5–15)
BUN: 21 mg/dL (ref 8–23)
CO2: 26 mmol/L (ref 22–32)
Calcium: 9.2 mg/dL (ref 8.9–10.3)
Chloride: 91 mmol/L — ABNORMAL LOW (ref 98–111)
Creatinine, Ser: 4.54 mg/dL — ABNORMAL HIGH (ref 0.44–1.00)
GFR, Estimated: 10 mL/min — ABNORMAL LOW (ref >60)
Glucose, Bld: 221 mg/dL — ABNORMAL HIGH (ref 70–99)
Phosphorus: 2 mg/dL — ABNORMAL LOW (ref 2.5–4.6)
Potassium: 3.1 mmol/L — ABNORMAL LOW (ref 3.5–5.1)
Sodium: 128 mmol/L — ABNORMAL LOW (ref 135–145)

## 2021-05-24 LAB — PROTEIN, TOTAL: Total Protein: 5.2 g/dL — ABNORMAL LOW (ref 6.5–8.1)

## 2021-05-24 LAB — HEPATITIS B SURFACE ANTIGEN: Hepatitis B Surface Ag: NONREACTIVE

## 2021-05-24 LAB — GLUCOSE, CAPILLARY
Glucose-Capillary: 225 mg/dL — ABNORMAL HIGH (ref 70–99)
Glucose-Capillary: 195 mg/dL — ABNORMAL HIGH (ref 70–99)
Glucose-Capillary: 190 mg/dL — ABNORMAL HIGH (ref 70–99)
Glucose-Capillary: 174 mg/dL — ABNORMAL HIGH (ref 70–99)
Glucose-Capillary: 89 mg/dL (ref 70–99)

## 2021-05-24 LAB — AST: AST: 13 U/L — ABNORMAL LOW (ref 15–41)

## 2021-05-24 LAB — BILIRUBIN, DIRECT: Bilirubin, Direct: 0.2 mg/dL (ref 0.0–0.2)

## 2021-05-24 LAB — PROCALCITONIN: Procalcitonin: 1.19 ng/mL

## 2021-05-24 LAB — ALKALINE PHOSPHATASE: Alkaline Phosphatase: 87 U/L (ref 38–126)

## 2021-05-24 MED ORDER — BOOST / RESOURCE BREEZE PO LIQD CUSTOM
1.0000 | Freq: Three times a day (TID) | ORAL | Status: DC
  Administered 2021-05-24 – 2021-05-29 (×8): 1 via ORAL

## 2021-05-24 MED ORDER — NOREPINEPHRINE 16 MG/250ML-% IV SOLN
0.0000 ug/min | INTRAVENOUS | Status: DC
  Administered 2021-05-24: 20 ug/min via INTRAVENOUS
  Administered 2021-05-25: 18 ug/min via INTRAVENOUS
  Administered 2021-05-25 – 2021-05-30 (×2): 8 ug/min via INTRAVENOUS
  Filled 2021-05-24 (×6): qty 250

## 2021-05-24 MED ORDER — CHLORHEXIDINE GLUCONATE CLOTH 2 % EX PADS
6.0000 | MEDICATED_PAD | Freq: Every day | CUTANEOUS | Status: DC
  Administered 2021-05-24 – 2021-05-25 (×2): 6 via TOPICAL

## 2021-05-24 MED ORDER — SODIUM CHLORIDE 0.9 % IV BOLUS
250.0000 mL | Freq: Once | INTRAVENOUS | Status: DC
Start: 2021-05-24 — End: 2021-05-31

## 2021-05-24 MED ORDER — POTASSIUM CHLORIDE CRYS ER 20 MEQ PO TBCR
20.0000 meq | EXTENDED_RELEASE_TABLET | Freq: Once | ORAL | Status: AC
  Administered 2021-05-24: 20 meq via ORAL
  Filled 2021-05-24: qty 1

## 2021-05-24 MED ORDER — INSULIN ASPART 100 UNIT/ML IJ SOLN
0.0000 [IU] | Freq: Three times a day (TID) | INTRAMUSCULAR | Status: DC
  Administered 2021-05-24 – 2021-05-28 (×6): 1 [IU] via SUBCUTANEOUS
  Administered 2021-05-28: 2 [IU] via SUBCUTANEOUS
  Administered 2021-05-29 (×2): 1 [IU] via SUBCUTANEOUS

## 2021-05-24 MED ORDER — ALBUMIN HUMAN 25 % IV SOLN: 12.5000 g | Freq: Once | INTRAVENOUS | Status: AC

## 2021-05-24 MED ORDER — ALBUMIN HUMAN 5 % IV SOLN: 12.5000 g | Freq: Once | INTRAVENOUS | Status: DC

## 2021-05-24 MED ORDER — RENA-VITE PO TABS
1.0000 | ORAL_TABLET | Freq: Every day | ORAL | Status: DC
  Administered 2021-05-24 – 2021-05-27 (×4): 1 via ORAL
  Filled 2021-05-24 (×4): qty 1

## 2021-05-24 MED ORDER — ALBUMIN HUMAN 25 % IV SOLN
INTRAVENOUS | Status: AC
  Administered 2021-05-24: 12.5 g via INTRAVENOUS
  Filled 2021-05-24: qty 50

## 2021-05-24 NOTE — Progress Notes (Signed)
NAME:  [name], MRN:  [id number], DOB:  [birth year], LOS: 2 ADMISSION DATE:  06/03/2021, CONSULTATION DATE:  05/24/2021  REFERRING MD:  Horris Latino TRH, CHIEF COMPLAINT:  low BP   History of Present Illness:  75 year old ESRD on hemodialysis, nursing home resident, brought in for generalized weakness, shortness of breath mental status. History obtained from son [name] at the bedside and reviewing chart. Last dialysis was 5 days ago , she is a difficult access and dialyzed via left permacath ED visit noted 10/3, chronic left upper extremity swelling, duplex was negative Son also reports sacral decubitus , she was noted to desaturate today and sent to the emergency room, requiring 2 L oxygen.  Initial blood pressure was 956 systolic but then dropped to the 80s.  On my arrival again 103/88 Labs showed WBC count of 18 K with left shift, she is noted to have chronic leukocytosis, stable anemia, BUN and creatinine have increased  Pertinent  Medical History  ESRD on HD Diabetes type 2 Large hiatal hernia Hypertension OSA Atrial fibrillation COPD CAD, Echo 03/2021 normal LVEF grade 1 diastolic dysfunction  Significant Hospital Events: Including procedures, antibiotic start and stop dates in addition to other pertinent events   10/18 hypotensive, started on Levophed peripheral , dialyzed 10/19 left femoral line placed and transferred to [facility] for CRRT  Interim History / Subjective:   Transferred from [facility], ICU to [facility] overnight.  Remains on Levophed 22 mcg with MAPs in the mid 13s.  SBP low 100s.  Patient is alert and oriented x3.  She states a lot is bothering her but does not specify what.  Denies any pain at this time.  Does not recall what brought her into the hospital either.  Objective   Blood pressure 98/64, pulse 87, temperature 99.2 F (37.3 C), temperature source Axillary, resp. rate 18, height 4\' 8"  (1.422 m), weight 65.4 kg, SpO2 100 %.        Intake/Output  Summary (Last 24 hours) at 05/24/2021 0724 Last data filed at 05/24/2021 0600 Gross per 24 hour  Intake 1906.93 ml  Output --  Net 1906.93 ml   Filed Weights   05/12/2021 1119 05/21/2021 1859 05/24/21 0400  Weight: 67.1 kg 66.1 kg 65.4 kg    Examination: Gen: chronically ill-appearing obese, in no distress, lying supine in bed HEENT: [place]/AT, PERRLA Lungs: Clear to auscultation bilaterally, normal effort on 2 L nasal cannula CV: RRR, no murmurs Abdomen: soft and non-tender, bowel sounds present Extremities: Significant edema of bilateral upper extremities and lower extremities Neuro: Alert and oriented x3, following commands Skin: Warm, dry  Labs- BUN 21, creatinine 4.5 Sodium 128 Potassium 3.1 Phosphorus 2 Procalcitonin pending WBC 16.4  CT abdomen 05/17/2021 showed large hiatal hernia and some thickening of descending and sigmoid colon Chest x-ray on 10/18 showed a small left pleural effusion and bibasilar atelectasis.  Large hiatal hernia. Resolved Hospital Problem list     Assessment & Plan:  75 year old female with a history of chronic hypotension on midodrine and chronic leukocytosis presenting with concerns for sepsis.  She is an ESRD on HD the patient with a left permacath.  Left arm chronically swollen and repeat venous duplex negative.  Recent echo in August showed no evidence of pericardial effusion.  Does not appear to be having acute coronary syndrome.  Possible sepsis Patient is mentating well this morning.  Maps have maintained in the mid 21H with a systolic blood pressure in the low 100s.  She is  on midodrine increased to 15 mg 3 times daily.  Blood cultures show no growth to date.  Continuing empiric antibiotics to cover sepsis and infectious colitis.  Consider titrating vasopressors for systolic goal rather than MAP -Continue cefepime and vancomycin -Wean vasopressor support for systolic blood pressure greater than 85 -Trend fever curve and CBC -Follow  cultures  Acute metabolic encephalopathy Possibly in the setting of sepsis.  Resolved.  Patient alert and mentating well oriented x3.  Acute hypoxic respiratory failure On 2 L supplemental oxygen.  Unclear baseline but does have a history of COPD.  States that she does not typically wear supplemental oxygen at home.  ESRD on HD Last hemodialysis was last Saturday?.  Transferred to [facility] for CRRT.  Plan for that today.  She does not appear uremic, BUN is suspiciously low.  History of atrial fibrillation On amiodarone and Eliquis   Suggest involve palliative care for goals of care discussion given difficult access issues and chronic hypertension, son indicated that he would be okay with short-term life support   Best Practice (right click and "Reselect all SmartList Selections" daily)   Diet/type: dysphagia diet (see orders) DVT prophylaxis: DOAC GI prophylaxis: N/A Lines: Central line and Dialysis Catheter Foley:  N/A Code Status:  full code Last date of multidisciplinary goals of care discussion [discussed with son [name] and he requests full CODE STATUS and short-term life support]  Labs   CBC: Recent Labs  Lab 05/17/21 1529 05/13/2021 1459 05/23/21 0035  WBC 13.9* 18.0* 17.9*  NEUTROABS 11.5* 16.2* 15.3*  HGB 9.1* 9.4* 8.3*  HCT 29.0* 28.3* 26.2*  MCV 94.2 94.0 95.3  PLT 194 251 798    Basic Metabolic Panel: Recent Labs  Lab 05/17/21 1529 06/03/2021 1243 05/23/21 0819  NA 133* 135 133*  K 3.2* 4.7 3.0*  CL 98 101 97*  CO2 26 23 24   GLUCOSE 86 88 146*  BUN 20 56* 20  CREATININE 3.18* 7.56* 3.77*  CALCIUM 8.4* 9.6 8.6*   GFR: Estimated Creatinine Clearance: 9.6 mL/min (A) (by C-G formula based on SCr of 3.77 mg/dL (H)). Recent Labs  Lab 05/17/21 1529 05/20/2021 1459 05/25/2021 1656 05/16/2021 1713 05/23/21 0035  PROCALCITON  --   --   --  1.31  --   WBC 13.9* 18.0*  --   --  17.9*  LATICACIDVEN  --   --  0.5  --   --     Liver Function Tests: Recent  Labs  Lab 05/17/21 1529  AST 18  ALT 11  ALKPHOS 100  BILITOT 1.0  PROT 5.2*  ALBUMIN 1.8*   Recent Labs  Lab 05/17/21 1529  LIPASE 23   No results for input(s): AMMONIA in the last 168 hours.  ABG    Component Value Date/Time   PHART 7.398 05/12/2021 1725   PCO2ART 40.6 05/06/2021 1725   PO2ART 97.8 05/06/2021 1725   HCO3 24.6 05/08/2021 1725   TCO2 26 04/13/2021 1733   ACIDBASEDEF 3.8 (H) 01/30/2007 1830   O2SAT 97.3 05/27/2021 [place], DO PGY-3 IMTS

## 2021-05-24 NOTE — Progress Notes (Addendum)
Pt received bed at [facility] hospital, I contacted [facility], Report given to [place], [facility] contacted report given. I contacted Son [name] to inform him of transfer.

## 2021-05-24 NOTE — Progress Notes (Signed)
[facility] Progress Note  Subjective: seen in ICU , weak and tired appearing, no specific c/o's. Levo at 20 ug/min.   Vitals:   05/24/21 0730 05/24/21 0800 05/24/21 0900 05/24/21 0912  BP:  (!) 89/52 (!) 109/38   Pulse:  93 77   Resp:  18 15   Temp: 98.7 F (37.1 C)     TempSrc: Oral     SpO2:  100% 100% 100%  Weight:      Height:        Exam:  alert, nad , frail , tired  no jvd  Chest cta bilat  Cor reg no RG  Abd soft ntnd no ascites   Ext 1-2+ bilat UE edema, no LE edema   Awake, tired, lethargic, nonfocal   L IJ TDC     OP HD: TTS [facility]   4h  400/800   67kg  2/2.5 bath  Hep none  LIJ TDC  - mircera 200 q2 last 10/10  - calc 1.25 ug tiw po   Assessment/ Plan: Shock: Presumed sepsis, getting IV abx vanc/ cefepime. Hx UTI's. Getting levo gtt at 20 ug/min today. Home midodrine ^'d to 15 mg tid.  ESRD: TTS HD. Plan HD today/ tonight in ICU. Uses TDC.  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 Received Mircera at OP HD on 10/10.  Transfuse as needed Atrial feb: on amio and eliquis Metabolic Bone Disease: Monitor calcium phosphorus level.  Hypokalemia: Managed with higher potassium bath on dialysis. Hyponatremia: hypervolemic, will attempt ultrafiltration during dialysis.  This has been challenging because of persistent hypotension.  Continue fluid restriction     Kelly Splinter 05/24/2021, 9:51 AM   Recent Labs  Lab 05/23/21 0035 05/23/21 0819 05/24/21 0639  K  --  3.0* 3.1*  BUN  --  20 21  CREATININE  --  3.77* 4.54*  CALCIUM  --  8.6* 9.2  PHOS  --   --  2.0*  HGB 8.3*  --  9.0*   Inpatient medications:  allopurinol  100 mg Oral QHS   amiodarone  200 mg Oral q AM   apixaban  2.5 mg Oral BID   atorvastatin  40 mg Oral q1800   Chlorhexidine Gluconate Cloth  6 each Topical Q0600   collagenase  1 application Topical Daily   ferrous sulfate  325 mg Oral QHS   insulin aspart  0-6 Units Subcutaneous TID WC   midodrine  15 mg Oral TID WC    mirtazapine  7.5 mg Oral QHS   mometasone-formoterol  2 puff Inhalation BID   pantoprazole  80 mg Oral BID AC    sodium chloride     sodium chloride     sodium chloride 250 mL (05/29/2021 1710)   ceFEPime (MAXIPIME) IV     norepinephrine (LEVOPHED) Adult infusion 22 mcg/min (05/24/21 0900)   vancomycin     sodium chloride, sodium chloride, acetaminophen **OR** acetaminophen, acetaminophen, albuterol, alteplase, heparin, HYDROcodone-acetaminophen, lidocaine (PF), lidocaine-prilocaine, ondansetron **OR** ondansetron (ZOFRAN) IV, pentafluoroprop-tetrafluoroeth, polyethylene glycol, polyvinyl alcohol

## 2021-05-24 NOTE — Progress Notes (Signed)
[facility] Progress Note Patient Name: [name] DOB: [birth year] MRN: [id number]   Date of Service  05/24/2021  HPI/Events of Note  Tx'ed to [facility] for CRRT.  Has femoral vein central venous catheter.  Has LEFT chest wall perm-a-cath  eICU Interventions  On levophed.  AAOx2     Intervention Category Evaluation Type: New Patient Evaluation  Tilden Dome 05/24/2021, 4:01 AM

## 2021-05-24 NOTE — Progress Notes (Addendum)
   NAME:  [name], MRN:  [id number], DOB:  08/25/1945, LOS: 2 ADMISSION DATE:  05/09/2021, CONSULTATION DATE:  05/24/2021  REFERRING MD:  Horris Latino TRH, CHIEF COMPLAINT:  low BP   History of Present Illness:  75 year old ESRD on hemodialysis, nursing home resident, brought in for generalized weakness, shortness of breath mental status. History obtained from son [name] at the bedside and reviewing chart. Last dialysis was 5 days ago , she is a difficult access and dialyzed via left permacath ED visit noted 10/3, chronic left upper extremity swelling, duplex was negative Son also reports sacral decubitus , she was noted to desaturate today and sent to the emergency room, requiring 2 L oxygen.  Initial blood pressure was 683 systolic but then dropped to the 80s.  On my arrival again 103/88 Labs showed WBC count of 18 K with left shift, she is noted to have chronic leukocytosis, stable anemia, BUN and creatinine have increased  Pertinent  Medical History  ESRD on HD Diabetes type 2 Large hiatal hernia Hypertension OSA Atrial fibrillation COPD CAD, Echo 03/2021 normal LVEF grade 1 diastolic dysfunction  Significant Hospital Events: Including procedures, antibiotic start and stop dates in addition to other pertinent events   10/18 hypotensive, started on Levophed peripheral , dialyzed 10/19 left femoral line placed and transferred to [facility] for CRRT  Interim History / Subjective:   Remains weak, frail appearing. On 20 mcg/min levophed.  Objective   Blood pressure (!) 133/44, pulse 75, temperature 98.3 F (36.8 C), temperature source Oral, resp. rate (!) 21, height 4\' 8"  (1.422 m), weight 65.4 kg, SpO2 100 %.        Intake/Output Summary (Last 24 hours) at 05/24/2021 2305 Last data filed at 05/24/2021 2000 Gross per 24 hour  Intake 1714.45 ml  Output 1900 ml  Net -185.55 ml    Filed Weights   05/11/2021 1859 05/24/21 0400 05/24/21 1525  Weight: 66.1 kg 65.4 kg 65.4  kg    Examination: Chronically ill appearing obese woman lying in bed Can barely lift arms off bed Scattered bruising in ext, minimal edema L arm is pretty swollen (duplex neg 10/4 so I guess chronic) Heart sounds regular, ext warm Answers questions, seems depressed BM x 1 10/19  CBG variable Phos low CBC stable  Resolved Hospital Problem list     Assessment & Plan:  Ongoing shock, acute on chronic- Pct mildly elevated and pressures borderline; CT read for questionable colitis; no signs of left abdominal wall cellulitis or hemorrhage as suggested by read. Chronic renal failure on HD with difficult IV access Progressive FTT Frail elderly Afib- on AC/amio PTA Severe muscular deconditioning Hypoxemia- related to poor mobility and atelectasis  - Continue increased dose midodrine, titrate levophed to SBP > 85 - Continue amio/eliquis - Would do 7 days cefepime - Await palliative assistance helping son come to terms with his mother's terminal decline and allow her to pass peacefully  Best Practice (right click and "Reselect all SmartList Selections" daily)   Diet/type: dysphagia diet (see orders) DVT prophylaxis: DOAC GI prophylaxis: N/A Lines: Central line and Dialysis Catheter Foley:  N/A Code Status:  full code Last date of multidisciplinary goals of care discussion [pending with palliative]  31 minutes cc time Erskine Emery MD PCCM

## 2021-05-24 NOTE — Progress Notes (Addendum)
Initial Nutrition Assessment  DOCUMENTATION CODES:   Severe malnutrition in context of chronic illness  INTERVENTION:  - Boost Breeze po TID, each supplement provides 250 kcal and 9 grams of protein (peach flavor)  - Liberalization of diet to regular help improve PO intake  - Assistance with mealtime feedings  - Recommend Cortrak tube if within [place] -Initiate Vital 1.5 at 25 ml/h and advance 24ml every 4 hours until goal rate of 45 ml/hr reached (1080 ml per day) Prosource TF 45 ml BID  - Recommended TF regimen would provide 1700 kcal, 96 gm protein, 825 ml free water daily  - Renal MVI daily   NUTRITION DIAGNOSIS:   Severe Malnutrition related to chronic illness (ESRD) as evidenced by energy intake < or equal to 75% for > or equal to 1 month, mild fat depletion, severe muscle depletion.  GOAL:   Patient will meet greater than or equal to 90% of their needs  MONITOR:   PO intake, Supplement acceptance, Weight trends, Labs  REASON FOR ASSESSMENT:   Consult Wound healing  ASSESSMENT:   Pt admitted 10/18 from SNF with SOB and AMS. PMH includes ESRD on HD (T/TH/S scheduled), CAD, CHF, COPD, T2DM, afib, GERD, large hiatal hernia, diverticulitis, HTN, OSA, and obesity.  Pt on pressor support for septic shock. Transferred from [facility] this morning to plan for CRRT d/t ongoing hypotension requiring pressor support, however noted Nephrology plan to continue HD.  10/18 HD: removed ~2L UF  Pt lethargic, resting in bed during visit. Called and spoke with pt's son, [name], regarding pt's nutrition since last admission. He has been encouraging her to try to eat but she has not had an appetite. He brought her chicken, a roll and fruit the other day and she only took a couple bites. She does not like the food at her SNF so he will usually bring food for her to eat. He states that she a few months ago she was eating a whole sub and now hardly eats anything. She does not like Nepro or  Ensure but liked the boost breeze that we provided her during her last admission.  Meal Completion:  (10/19) 15% x 1 recorded meal He states that she has not been able to feed herself d/t being lethargic and thinks that she may benefit from some assistance with her mealtime.  Spoke with MD about liberalizing diet in an effort to improve PO intake. Also, discussed recommendation for nutrition support. Noted palliative consult pending.  He states that she has been malnourished for quite some time. However he denies any recent noticeable weight loss. Per chart review, noted weight loss over time. Per nephrology, pt's dry weight is 67 kg however noted current weight is lower than dry weight which indicates true dry weight loss is present. Suspect pt's dry weight will need to be lowered at d/c.  Medications: ferrous sulfate, SSI, remeron, protonix, maxipime, levophed, vancomycin  Labs: sodium 128, potassium 3.1, Cr 4.54, phosphorous 2.0, AST 13, CBG 134-225 x24 hours I/O's: +477mL since admission  NUTRITION - FOCUSED PHYSICAL EXAM:  Flowsheet Row Most Recent Value  Orbital Region Mild depletion  Upper Arm Region Unable to assess  [significant swelling]  Thoracic and Lumbar Region Mild depletion  Buccal Region Mild depletion  Temple Region Mild depletion  Clavicle Bone Region Moderate depletion  Clavicle and Acromion Bone Region Moderate depletion  Scapular Bone Region Severe depletion  Dorsal Hand Unable to assess  [significant swelling]  Patellar Region Severe depletion  Anterior Thigh Region Severe depletion  Posterior Calf Region Severe depletion  Edema (RD Assessment) Moderate  [BUE]  Hair Reviewed  Eyes Unable to assess  [pt resting]  Mouth Unable to assess  [pt resting]  Skin Reviewed  Nails Unable to assess  [nail polish]       Diet Order:   Diet Order             Diet regular Room service appropriate? Yes; Fluid consistency: Thin; Fluid restriction: 1200 mL Fluid  Diet  effective now                   EDUCATION NEEDS:   Not appropriate for education at this time  Skin:  Skin Assessment: Skin Integrity Issues: Skin Integrity Issues:: Unstageable Unstageable: sacrum  Last BM:  05/23/21  Height:   Ht Readings from Last 1 Encounters:  05/29/2021 4\' 8"  (1.422 m)    Weight:   Wt Readings from Last 1 Encounters:  05/24/21 65.4 kg    BMI:  Body mass index is 32.32 kg/m.  Estimated Nutritional Needs:   Kcal:  1600-1800  Protein:  90-105g  Fluid:  1L + UOP  Clayborne Dana, RDN, LDN Clinical Nutrition

## 2021-05-25 DIAGNOSIS — R4589 Other symptoms and signs involving emotional state: Secondary | ICD-10-CM | POA: Diagnosis not present

## 2021-05-25 DIAGNOSIS — J9611 Chronic respiratory failure with hypoxia: Secondary | ICD-10-CM

## 2021-05-25 DIAGNOSIS — Z7189 Other specified counseling: Secondary | ICD-10-CM

## 2021-05-25 DIAGNOSIS — Z515 Encounter for palliative care: Secondary | ICD-10-CM

## 2021-05-25 LAB — CBC WITH DIFFERENTIAL/PLATELET
Abs Immature Granulocytes: 0.2 10*3/uL — ABNORMAL HIGH (ref 0.00–0.07)
Basophils Absolute: 0 10*3/uL (ref 0.0–0.1)
Basophils Relative: 0 %
Eosinophils Absolute: 0 10*3/uL (ref 0.0–0.5)
Eosinophils Relative: 0 %
HCT: 27.8 % — ABNORMAL LOW (ref 36.0–46.0)
Hemoglobin: 8.9 g/dL — ABNORMAL LOW (ref 12.0–15.0)
Immature Granulocytes: 1 %
Lymphocytes Relative: 4 %
Lymphs Abs: 0.6 10*3/uL — ABNORMAL LOW (ref 0.7–4.0)
MCH: 29.5 pg (ref 26.0–34.0)
MCHC: 32 g/dL (ref 30.0–36.0)
MCV: 92.1 fL (ref 80.0–100.0)
Monocytes Absolute: 1.1 10*3/uL — ABNORMAL HIGH (ref 0.1–1.0)
Monocytes Relative: 7 %
Neutro Abs: 13.9 10*3/uL — ABNORMAL HIGH (ref 1.7–7.7)
Neutrophils Relative %: 88 %
Platelets: 175 10*3/uL (ref 150–400)
RBC: 3.02 MIL/uL — ABNORMAL LOW (ref 3.87–5.11)
RDW: 19.7 % — ABNORMAL HIGH (ref 11.5–15.5)
WBC: 15.7 10*3/uL — ABNORMAL HIGH (ref 4.0–10.5)
nRBC: 0.3 % — ABNORMAL HIGH (ref 0.0–0.2)

## 2021-05-25 LAB — GLUCOSE, CAPILLARY
Glucose-Capillary: 130 mg/dL — ABNORMAL HIGH (ref 70–99)
Glucose-Capillary: 99 mg/dL (ref 70–99)
Glucose-Capillary: 135 mg/dL — ABNORMAL HIGH (ref 70–99)
Glucose-Capillary: 146 mg/dL — ABNORMAL HIGH (ref 70–99)

## 2021-05-25 LAB — RENAL FUNCTION PANEL
Albumin: 2.3 g/dL — ABNORMAL LOW (ref 3.5–5.0)
Anion gap: 8 (ref 5–15)
BUN: 9 mg/dL (ref 8–23)
CO2: 24 mmol/L (ref 22–32)
Calcium: 8.6 mg/dL — ABNORMAL LOW (ref 8.9–10.3)
Chloride: 98 mmol/L (ref 98–111)
Creatinine, Ser: 2.75 mg/dL — ABNORMAL HIGH (ref 0.44–1.00)
GFR, Estimated: 17 mL/min — ABNORMAL LOW (ref >60)
Glucose, Bld: 119 mg/dL — ABNORMAL HIGH (ref 70–99)
Phosphorus: 1.4 mg/dL — ABNORMAL LOW (ref 2.5–4.6)
Potassium: 3.7 mmol/L (ref 3.5–5.1)
Sodium: 130 mmol/L — ABNORMAL LOW (ref 135–145)

## 2021-05-25 LAB — HEPATITIS B SURFACE ANTIBODY, QUANTITATIVE: Hep B S AB Quant (Post): <3.1 m[IU]/mL — ABNORMAL LOW (ref >9.9)

## 2021-05-25 LAB — HEMOGLOBIN A1C
Hgb A1c MFr Bld: 4.6 % — ABNORMAL LOW (ref 4.8–5.6)
Mean Plasma Glucose: 85 mg/dL

## 2021-05-25 MED ORDER — CALCITRIOL 0.25 MCG PO CAPS
1.2500 ug | ORAL_CAPSULE | ORAL | Status: DC
  Administered 2021-05-26: 1.25 ug via ORAL
  Filled 2021-05-25: qty 5

## 2021-05-25 NOTE — Progress Notes (Signed)
[facility] Progress Note  Subjective: seen in ICU , 1.9 L UF w/ HD yest, levo down to 18, pt lethargic. Meeting w/ pall care team this am.   Vitals:   05/25/21 0915 05/25/21 0930 05/25/21 0945 05/25/21 1000  BP: (!) 127/50 (!) 127/47 (!) 122/37 (!) 129/38  Pulse: 88 87 80 72  Resp:  19    Temp:      TempSrc:      SpO2:      Weight:      Height:        Exam:  alert, nad , frail , tired and deconditioned  no jvd  Chest cta bilat  Cor reg no RG  Abd soft ntnd no ascites   Ext 2+ bilat UE edema, no LE edema   Awake, tired, lethargic, nonfocal   L IJ TDC     OP HD: TTS [facility]KC   4h  400/800   67kg  2/2.5 bath  Hep none  LIJ TDC  - mircera 200 q2 last 10/10  - calc 1.25 ug tiw po  CXR 10/18 - IMPRESSION: 1. Small left pleural effusion and bibasilar atelectasis. 2. Large hiatal hernia.  Assessment/ Plan: Shock: Presumed sepsis, getting IV abx vanc/ cefepime. Possible colitis per CT. Home midodrine ^'d to 15 mg tid here, also continues on levo gtt.  Per pmd.  ESRD: TTS HD. Had HD last night. Next HD tomorrow.  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 Received Mircera at OP HD on 10/10, due on 10/25.  Transfuse as needed BP/ volume: chronically low BP's on midodrine at home. Losing body wt, under 2kg today. Sig UE edema, no pulm edema. UF as BP tolerates w/ HD.  Atrial feb: on amio and eliquis Metabolic Bone Disease: Ca in range, phos is low. Cont vdra po tiw. No binder needed.  Hypokalemia: improved today     [name] 05/25/2021, 10:32 AM   Recent Labs  Lab 05/24/21 0639 05/25/21 0352  K 3.1* 3.7  BUN 21 9  CREATININE 4.54* 2.75*  CALCIUM 9.2 8.6*  PHOS 2.0* 1.4*  HGB 9.0* 8.9*    Inpatient medications:  allopurinol  100 mg Oral QHS   amiodarone  200 mg Oral q AM   apixaban  2.5 mg Oral BID   atorvastatin  40 mg Oral q1800   Chlorhexidine Gluconate Cloth  6 each Topical Q0600   Chlorhexidine Gluconate Cloth  6 each Topical Q0600    collagenase  1 application Topical Daily   feeding supplement  1 Container Oral TID BM   ferrous sulfate  325 mg Oral QHS   insulin aspart  0-9 Units Subcutaneous TID WC   midodrine  15 mg Oral TID WC   mirtazapine  7.5 mg Oral QHS   mometasone-formoterol  2 puff Inhalation BID   multivitamin  1 tablet Oral QHS   pantoprazole  80 mg Oral BID AC    sodium chloride     sodium chloride     sodium chloride 250 mL (05/24/2021 1710)   ceFEPime (MAXIPIME) IV 2 g (05/24/21 1856)   norepinephrine (LEVOPHED) Adult infusion 14 mcg/min (05/25/21 0953)   sodium chloride     sodium chloride, sodium chloride, acetaminophen **OR** acetaminophen, acetaminophen, albuterol, alteplase, heparin, HYDROcodone-acetaminophen, lidocaine (PF), lidocaine-prilocaine, ondansetron **OR** ondansetron (ZOFRAN) IV, pentafluoroprop-tetrafluoroeth, polyethylene glycol, polyvinyl alcohol

## 2021-05-25 NOTE — Consult Note (Signed)
Palliative Medicine Inpatient Consult Note Consulting Provider: Kipp Brood, MD  Reason for consult:   [place] [facility]iative Medicine Consult  Reason for Consult? progressive decline in elderly HD patient. son may have difficulty accepting   HPI:  Per intake H&P --> 75 year old woman who is critically ill due to vasoplegic shock due to presumed sepsis although no clear source found.  She has a more complicated history of longstanding ESRD on hemodialysis with recent development of hypotension requiring initiation of midodrine.  She has had generalized weakness and is now nursing home bound.  Palliative care has been asked to get involved to discuss goals of care in the setting of a progressively declining functional and overall poor health. She   Clinical Assessment/Goals of Care:  *Please note that this is a verbal dictation therefore any spelling or grammatical errors are due to the "[employer] One" system interpretation.  I have reviewed medical records including EPIC notes, labs and imaging, received report from bedside RN, assessed the patient who is lying in bed this morning in no acute distress.  She is alert and oriented to person place month and year.  She is able to converse with me though incrementally drifts off and needs to be redirected..    I met with [name] to further discuss diagnosis prognosis, GOC, EOL wishes, disposition and options.  Mikle Bosworth and I reviewed her past medical conditions inclusive of her history of end-stage renal disease, coronary artery disease, COPD, GERD, A. fib, major depressive disorder, obstructive sleep apnea, type 2 diabetes, and hypotension.   I introduced Palliative Medicine as specialized medical care for people living with serious illness. It focuses on providing relief from the symptoms and stress of a serious illness. The goal is to improve quality of life for both the patient and the  family.  Patient is widowed, she lost her spouse three years ago. Retired Visual merchandiser. She has three sons (one whom lives in the home with her [name])). She has six grandchildren and two great grandchildren. Enjoys quilting and sewing. Christian faith.   A detailed discussion was had today regarding advanced directives, patient shares that she defers to her sons to make decisions for her.    Concepts specific to code status, artifical feeding and hydration, continued IV antibiotics and rehospitalization was had.  Lorenzo shared "if you can save me then save me".  I shared with her that this is more complicated than just saving her life and keeping her heart beating.  I shared that often the measures we perform to maintain life can be very traumatic to patient's and have little to no benefit most especially in patients who have 2 or greater chronic comorbid conditions.  I shared with Awa that I would strongly recommend consideration of a DO NOT RESUSCITATE order.  Nykeria shares that her goals are to go back home living with her son [name].  I expressed my concerns regarding her poor functional state and poor nutritional state at this juncture.  We reviewed that her overall outlook right now is very poor and that I would like to have a discussion with the rest of her family and herself to provide additional insight and information regarding this.  Aalia is open to having a family meeting.  Discussed the importance of continued conversation with family and their  medical providers regarding overall plan of care and treatment options, ensuring decisions are within the context of the patients values  and GOCs.  ________________________________________________________ Addendum:  I spoke to patient's son, [name] at length this morning.  He expressed to me that his mother has been declining for quite some nighttime.  He shares with me that he has been her primary caregiver for  the past 3 years and she has had tremendous ups and downs in the setting of receiving hemodialysis and her fistula is malfunctioning as well as "her veins blowing".  He shares that she was able to mobilize with a walker for period of time though over the past 2 months she has been living at [facility] and her condition has declined since being there from a functional perspective.  He reviews that he is well aware of how malnourished she is and how muscularly deconditioned she is.  He shares that over the past 2 weeks she has gone "downhill".  [name] expresses to me that he to has his own health ailments and caring for his mother has been his primary focus in life as he does not have any children or a wife of his own.  He expresses he is struggled with this is a [place] though he does remain to have a strong faith.  I asked [name] if we may schedule a meeting to all talk together given his mother's to declining state to make decisions about options such as CODE STATUS and potentially discussion of hospice.  I described hospice as a service for patients who have a life expectancy of < 6 months. It preserves dignity and quality at the end phases of life. The focus changes from curative to symptom relief.   [name] shared that he would be willing to meet with me and we will try to coordinate with the rest of his family reasonable time over the weekend.  ______________________________________________________________ Addendum #2[name] called me back later this morning to share with me that he would prefer to not meet though we can still proceed with meeting his brothers, [name] and Chrissie Noa.  He at this point in time would like to forego additional decision and allow that to be in his eldest brother David's hands.  He states that he and to some degree has an attachment to his mother and would do "anything for her" which his brothers do not seem to have.    [name] shares that he is not convinced his mother is ready  to "give up".  I spent time listening to him and listening to how overburdened he has been over the past few years and how he is forgone his own health in order to aid in support his mother's health and wellbeing.  I provided therapeutic support through reflective listening.  We agreed that I would reach out to Stefani's other son, [name] to coordinate a time to meet tomorrow.  I shared with [name] I want to verify if decisions are made that he does not feel slighted or not considered.  He again vocalizes that he will defer to his brothers to make additional decisions.  And was thankful for my time listening to his generalized concerns. ________________________________________________________________ Addendum # 3:  I spoke to patient's son [name] who is her eldest son.  We reviewed Carlon's acute on chronic health conditions and her poor overall health state.  We discussed the importance of meeting in person for additional decisions to be made.  [name] shares that he plans to be present in the hospital tomorrow at 2 PM.  Questions and concerns answered palliative support provided  Decision  Maker: Amaryllis Dyke: [phone]  SUMMARY OF RECOMMENDATIONS   Full Code / Full scope of care --> for the time being discussed with patient and her son the idea of a DO NOT RESUSCITATE given her underlying comorbid conditions and the likelihood of causing more trauma than benefit  Plan for family meeting tomorrow at 2 PM patient's son and primary caregiver, [name] does not wish to be present and he defers decision-making to his other brothers [name] and Chrissie Noa  Ongoing support from the palliative care team during this difficult time  Code Status/Advance Care Planning: FULL CODE    Palliative Prophylaxis:  Oral care, turn every 2 hours, delirium precautions, aspiration precautions  Additional Recommendations (Limitations, Scope, Preferences): Continue current scope of care for the time  being  Psycho-social/Spiritual:  Desire for further Chaplaincy support: Yes patient is [facility] Additional Recommendations: Education on chronic disease processes   Prognosis: Prognosis overall is exceptionally poor in the setting of 4 inpatient admissions in the last 6 months and 1 ED admission.  Patient has multiple chronic comorbid conditions and is identified as very severely frail with notable hypoalbuminemia.  Discharge Planning: Discharge plan unclear at this time.  Vitals:   05/25/21 0430 05/25/21 0500  BP: (!) 131/45 115/79  Pulse: 79 88  Resp: 20 16  Temp:    SpO2: 100% 100%    Intake/Output Summary (Last 24 hours) at 05/25/2021 5366 Last data filed at 05/25/2021 0400 Gross per 24 hour  Intake 802.41 ml  Output 1900 ml  Net -1097.59 ml   Last Weight  Most recent update: 05/24/2021  4:07 PM    Weight  65.4 kg (144 lb 2.9 oz)            Gen: Frail elderly female in no acute distress HEENT: Dry mucous membranes CV: Regular rate and irregular rhythm llops PULM:  2LPM [place] ABD: soft/nontender  EXT: Edema noted in bilateral upper extremities Neuro: Alert and oriented x2-3  PPS: 20%   This conversation/these recommendations were discussed with patient primary care team, Dr. Tamala Julian  Time In: 0915 Time Out: 1127 Total Time: 133 minutes Greater than 50%  of this time was spent counseling and coordinating care related to the above assessment and plan.  [place] Team Team Cell Phone: [phone] Please utilize secure chat with additional questions, if there is no response within 30 minutes please call the above phone number  Palliative Medicine Team providers are available by phone from 7am to 7pm daily and can be reached through the team cell phone.  Should this patient require assistance outside of these hours, please call the patient's attending physician.

## 2021-05-26 DIAGNOSIS — R0602 Shortness of breath: Secondary | ICD-10-CM | POA: Diagnosis not present

## 2021-05-26 DIAGNOSIS — Z7189 Other specified counseling: Secondary | ICD-10-CM | POA: Diagnosis not present

## 2021-05-26 DIAGNOSIS — Z515 Encounter for palliative care: Secondary | ICD-10-CM | POA: Diagnosis not present

## 2021-05-26 LAB — RENAL FUNCTION PANEL
Albumin: 1.6 g/dL — ABNORMAL LOW (ref 3.5–5.0)
Anion gap: 6 (ref 5–15)
BUN: 15 mg/dL (ref 8–23)
CO2: 20 mmol/L — ABNORMAL LOW (ref 22–32)
Calcium: 7.2 mg/dL — ABNORMAL LOW (ref 8.9–10.3)
Chloride: 109 mmol/L (ref 98–111)
Creatinine, Ser: 3.5 mg/dL — ABNORMAL HIGH (ref 0.44–1.00)
GFR, Estimated: 13 mL/min — ABNORMAL LOW (ref >60)
Glucose, Bld: 109 mg/dL — ABNORMAL HIGH (ref 70–99)
Phosphorus: 1.5 mg/dL — ABNORMAL LOW (ref 2.5–4.6)
Potassium: 3.1 mmol/L — ABNORMAL LOW (ref 3.5–5.1)
Sodium: 135 mmol/L (ref 135–145)

## 2021-05-26 LAB — CBC WITH DIFFERENTIAL/PLATELET
Abs Immature Granulocytes: 0.13 10*3/uL — ABNORMAL HIGH (ref 0.00–0.07)
Basophils Absolute: 0 10*3/uL (ref 0.0–0.1)
Basophils Relative: 0 %
Eosinophils Absolute: 0.1 10*3/uL (ref 0.0–0.5)
Eosinophils Relative: 1 %
HCT: 25.4 % — ABNORMAL LOW (ref 36.0–46.0)
Hemoglobin: 8.3 g/dL — ABNORMAL LOW (ref 12.0–15.0)
Immature Granulocytes: 1 %
Lymphocytes Relative: 6 %
Lymphs Abs: 0.6 10*3/uL — ABNORMAL LOW (ref 0.7–4.0)
MCH: 29.9 pg (ref 26.0–34.0)
MCHC: 32.7 g/dL (ref 30.0–36.0)
MCV: 91.4 fL (ref 80.0–100.0)
Monocytes Absolute: 0.6 10*3/uL (ref 0.1–1.0)
Monocytes Relative: 5 %
Neutro Abs: 9 10*3/uL — ABNORMAL HIGH (ref 1.7–7.7)
Neutrophils Relative %: 87 %
Platelets: 142 10*3/uL — ABNORMAL LOW (ref 150–400)
RBC: 2.78 MIL/uL — ABNORMAL LOW (ref 3.87–5.11)
RDW: 19.2 % — ABNORMAL HIGH (ref 11.5–15.5)
WBC: 10.4 10*3/uL (ref 4.0–10.5)
nRBC: 0.2 % (ref 0.0–0.2)

## 2021-05-26 LAB — GLUCOSE, CAPILLARY: Glucose-Capillary: 130 mg/dL — ABNORMAL HIGH (ref 70–99)

## 2021-05-26 MED ORDER — ORAL CARE MOUTH RINSE
15.0000 mL | Freq: Two times a day (BID) | OROMUCOSAL | Status: DC
  Administered 2021-05-26 – 2021-05-29 (×8): 15 mL via OROMUCOSAL

## 2021-05-26 MED ORDER — CHLORHEXIDINE GLUCONATE 0.12 % MT SOLN
15.0000 mL | Freq: Two times a day (BID) | OROMUCOSAL | Status: DC
  Administered 2021-05-26 – 2021-05-29 (×8): 15 mL via OROMUCOSAL
  Filled 2021-05-26 (×5): qty 15

## 2021-05-26 NOTE — Progress Notes (Signed)
[name] Progress Note  Subjective: seen in ICU , levo gtt down to 6 ug/min  Vitals:   05/26/21 1000 05/26/21 1100 05/26/21 1130 05/26/21 1200  BP: (!) 105/42 (!) 98/35  (!) 102/40  Pulse: 83 75  72  Resp: 13 15  (!) 21  Temp:   98 F (36.7 C)   TempSrc:   Axillary   SpO2: 100% 100%  100%  Weight:      Height:        Exam:  alert, nad , frail , tired and deconditioned  no jvd  Chest cta bilat  Cor reg no RG  Abd soft ntnd no ascites   Ext 2+ bilat UE edema, no LE edema   Awake, tired, lethargic, nonfocal   L IJ TDC     OP HD: TTS [facility]   4h  400/800   67kg  2/2.5 bath  Hep none  LIJ TDC  - mircera 200 q2 last 10/10  - calc 1.25 ug tiw po  CXR 10/18 - IMPRESSION: 1. Small left pleural effusion and bibasilar atelectasis. 2. Large hiatal hernia.  Assessment/ Plan: Shock: Presumed sepsis, getting IV abx vanc/ cefepime. Possible colitis per CT. Blood cx's negative. WBC improving. Home midodrine ^'d to 15 mg tid here, also continues on levo gtt. ESRD: TTS HD. HD today.  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 Received Mircera at OP HD on 10/10, due on 10/25.  Transfuse as needed BP/ volume: chronically low BP's on midodrine at home. Under 2kg today w/ diffuse UE/ facial edema.  UF as BP tolerates w/ HD.  Atrial feb: on amio and eliquis Metabolic Bone Disease: Ca in range, phos is low. Cont vdra po tiw. No binder needed.  FTT - sig decline this past year. Palliative care team consulting, appreciate assistance.      [name] 05/26/2021, 3:56 PM   Recent Labs  Lab 05/24/21 0639 05/25/21 0352 05/26/21 0407  K 3.1* 3.7  --   BUN 21 9  --   CREATININE 4.54* 2.75*  --   CALCIUM 9.2 8.6*  --   PHOS 2.0* 1.4*  --   HGB 9.0* 8.9* 8.3*    Inpatient medications:  allopurinol  100 mg Oral QHS   amiodarone  200 mg Oral q AM   apixaban  2.5 mg Oral BID   atorvastatin  40 mg Oral q1800   calcitRIOL  1.25 mcg Oral Q T,Th,Sa-HD   chlorhexidine  15 mL  Mouth Rinse BID   Chlorhexidine Gluconate Cloth  6 each Topical Q0600   collagenase  1 application Topical Daily   feeding supplement  1 Container Oral TID BM   ferrous sulfate  325 mg Oral QHS   insulin aspart  0-9 Units Subcutaneous TID WC   mouth rinse  15 mL Mouth Rinse q12n4p   midodrine  15 mg Oral TID WC   mirtazapine  7.5 mg Oral QHS   mometasone-formoterol  2 puff Inhalation BID   multivitamin  1 tablet Oral QHS   pantoprazole  80 mg Oral BID AC    sodium chloride     sodium chloride     sodium chloride 250 mL (05/20/2021 1710)   ceFEPime (MAXIPIME) IV 2 g (05/24/21 1856)   norepinephrine (LEVOPHED) Adult infusion 6.4 mcg/min (05/26/21 0400)   sodium chloride     sodium chloride, sodium chloride, acetaminophen **OR** acetaminophen, acetaminophen, albuterol, alteplase, heparin, HYDROcodone-acetaminophen, lidocaine (PF), lidocaine-prilocaine, ondansetron **OR** ondansetron (ZOFRAN) IV, pentafluoroprop-tetrafluoroeth, polyethylene glycol,  polyvinyl alcohol

## 2021-05-26 NOTE — Progress Notes (Signed)
NAME:  [name], MRN:  [id number], DOB:  1945-08-11, LOS: 4 ADMISSION DATE:  05/11/2021, CONSULTATION DATE:  05/26/2021  REFERRING MD:  Horris Latino TRH, CHIEF COMPLAINT:  low BP   History of Present Illness:  75 year old ESRD on hemodialysis, nursing home resident, brought in for generalized weakness, shortness of breath mental status. History obtained from son [name] at the bedside and reviewing chart. Last dialysis was 5 days ago , she is a difficult access and dialyzed via left permacath ED visit noted 10/3, chronic left upper extremity swelling, duplex was negative Son also reports sacral decubitus , she was noted to desaturate today and sent to the emergency room, requiring 2 L oxygen.  Initial blood pressure was 476 systolic but then dropped to the 80s.  On my arrival again 103/88 Labs showed WBC count of 18 K with left shift, she is noted to have chronic leukocytosis, stable anemia, BUN and creatinine have increased  Pertinent  Medical History  ESRD on HD Diabetes type 2 Large hiatal hernia Hypertension OSA Atrial fibrillation COPD CAD, Echo 03/2021 normal LVEF grade 1 diastolic dysfunction  Significant Hospital Events: Including procedures, antibiotic start and stop dates in addition to other pertinent events   10/18 hypotensive, started on Levophed peripheral , dialyzed 10/19 left femoral line placed and transferred to [facility]  Interim History / Subjective:   Continues to be levophed dependent. Not eating, sleeping most of day.  Objective   Blood pressure (!) 104/48, pulse 66, temperature 98.4 F (36.9 C), temperature source Axillary, resp. rate (!) 28, height 4\' 8"  (1.422 m), weight 65.4 kg, SpO2 100 %.        Intake/Output Summary (Last 24 hours) at 05/26/2021 0803 Last data filed at 05/26/2021 0400 Gross per 24 hour  Intake 292.67 ml  Output --  Net 292.67 ml    Filed Weights   05/21/2021 1859 05/24/21 0400 05/24/21 1525  Weight: 66.1 kg  65.4 kg 65.4 kg    Examination: Chronically ill appearing obese woman lying in bed MMM Can barely lift arms off bed and requires repeated prompting Scattered bruising in ext, minimal edema L arm is stable, swollen Heart sounds regular, ext warm Answers questions, flat affect, RASS -1 BM x 1 10/19  CBG variable Chemistries pending CBC stable  Patient Lines/Drains/Airways Status     Active Line/Drains/Airways     Name Placement date Placement time Site Days   Peripheral IV 05/26/2021 20 G 1.88" Right Antecubital 05/08/2021  1650  Antecubital  4   CVC Triple Lumen 05/23/21 Left Femoral 05/23/21  1000  -- 3   Hemodialysis Catheter Left Subclavian 01/15/21  1019  Subclavian  131   Pressure Injury 05/24/2021 Sacrum Lower;Mid Stage 3 -  Full thickness tissue loss. Subcutaneous fat may be visible but bone, tendon or muscle are NOT exposed. 7.5 cm x 2.5cm covered with yellow slough 05/06/2021  1900  -- 4             Resolved Hospital Problem list     Assessment & Plan:  Ongoing shock, acute on chronic- Pct mildly elevated and pressures borderline; CT read for questionable colitis; no signs of left abdominal wall cellulitis or hemorrhage as suggested by read. Chronic renal failure on HD with difficult IV access Progressive FTT Frail elderly Afib- on AC/amio PTA Severe muscular deconditioning- essentially bedbound, sacral pressure injury POA Hypoxemia- related to poor mobility and atelectasis  - Continue increased dose midodrine, titrate levophed to SBP > 85 -  Continue amio/eliquis - 7 days cefepime - GOC discussion today, I think she is near end of life  Best Practice (right click and "Reselect all SmartList Selections" daily)   Diet/type: dysphagia diet (see orders) DVT prophylaxis: DOAC GI prophylaxis: N/A Lines: Central line and Dialysis Catheter Foley:  N/A Code Status:  full code Last date of multidisciplinary goals of care discussion [pending with palliative, appreciate  help]  34 minutes cc time Erskine Emery MD PCCM

## 2021-05-26 NOTE — Progress Notes (Signed)
Palliative Medicine Inpatient Follow Up Note  Consulting Provider: Kipp Brood, MD   Reason for consult:   [place] Palliative Medicine Consult  Reason for Consult? progressive decline in elderly HD patient. son may have difficulty accepting    HPI:  Per intake H&P --> 75 year old woman who is critically ill due to vasoplegic shock due to presumed sepsis although no clear source found.  She has a more complicated history of longstanding ESRD on hemodialysis with recent development of hypotension requiring initiation of midodrine.  She has had generalized weakness and is now nursing home bound.   Palliative care has been asked to get involved to discuss goals of care in the setting of a progressively declining functional and overall poor health  Today's Discussion (05/26/2021):  *Please note that this is a verbal dictation therefore any spelling or grammatical errors are due to the "[place] One" system interpretation.  Chart reviewed.  I met with Ethelene Hal (son), his wife (daughter in law) and daughter (granddaughter) at bedside. I was joined by patients bedside RN, Lattie Haw. We reviewed patients acute on chronic medical conditions inclusive of her septic shock requiring the need for BS antibiotics and pressor support.  We discussed that Mirely is in a tricky spot in terms of her soft blood pressures and the need for HD. We reviewed the "what if's" of intolerance to HD in regards to these present matters. We discussed if dialysis cannot be done moving forward what that may look like.  We provided a comprehensive review of code status in terms of what "full code" and "DNR" mean. We reviewed the importance of considering all options and the reality of what cardiopulmonary resuscitation would look like in the long run.   Edris states that she would appreciate some time to absorb and think about all components of our conversation. We reviewed that  no decisions need to be made today and truly our role is to advocate for what she wants moving forward. She and her family were thankful of this time.   Questions and concerns addressed / Palliative support was provided  Objective Assessment: Vital Signs Vitals:   05/26/21 1130 05/26/21 1200  BP:  (!) 102/40  Pulse:  72  Resp:  (!) 21  Temp: 98 F (36.7 C)   SpO2:  100%    Intake/Output Summary (Last 24 hours) at 05/26/2021 1527 Last data filed at 05/26/2021 0800 Gross per 24 hour  Intake 203.5 ml  Output --  Net 203.5 ml   Last Weight  Most recent update: 05/24/2021  4:07 PM    Weight  65.4 kg (144 lb 2.9 oz)            Gen: Frail elderly female in no acute distress HEENT: Dry mucous membranes CV: Regular rate and irregular rhythm llops PULM:  2LPM [place] ABD: soft/nontender  EXT: Edema noted in bilateral upper extremities, (+) weeping Neuro: Alert and oriented x3   SUMMARY OF RECOMMENDATIONS   Full Code / Full scope of care --> Patient would like to think about DNR   Continue with current level of care support   Ongoing support from the palliative care team during this difficult time  Time Spent: 48 Greater than 50% of the time was spent in counseling and coordination of care ______________________________________________________________________________________ [place] Team Team Cell Phone: [phone] Please utilize secure chat with additional questions, if there is no response within 30 minutes please call the  above phone number  Palliative Medicine Team providers are available by phone from 7am to 7pm daily and can be reached through the team cell phone.  Should this patient require assistance outside of these hours, please call the patient's attending physician.

## 2021-05-27 DIAGNOSIS — R0602 Shortness of breath: Secondary | ICD-10-CM

## 2021-05-27 DIAGNOSIS — Z515 Encounter for palliative care: Secondary | ICD-10-CM | POA: Diagnosis not present

## 2021-05-27 LAB — CBC WITH DIFFERENTIAL/PLATELET
Abs Immature Granulocytes: 0.2 10*3/uL — ABNORMAL HIGH (ref 0.00–0.07)
Basophils Absolute: 0 10*3/uL (ref 0.0–0.1)
Basophils Relative: 0 %
Eosinophils Absolute: 0 10*3/uL (ref 0.0–0.5)
Eosinophils Relative: 0 %
HCT: 26.2 % — ABNORMAL LOW (ref 36.0–46.0)
Hemoglobin: 8.5 g/dL — ABNORMAL LOW (ref 12.0–15.0)
Immature Granulocytes: 1 %
Lymphocytes Relative: 4 %
Lymphs Abs: 0.6 10*3/uL — ABNORMAL LOW (ref 0.7–4.0)
MCH: 29.6 pg (ref 26.0–34.0)
MCHC: 32.4 g/dL (ref 30.0–36.0)
MCV: 91.3 fL (ref 80.0–100.0)
Monocytes Absolute: 1 10*3/uL (ref 0.1–1.0)
Monocytes Relative: 7 %
Neutro Abs: 12.5 10*3/uL — ABNORMAL HIGH (ref 1.7–7.7)
Neutrophils Relative %: 88 %
Platelets: 196 10*3/uL (ref 150–400)
RBC: 2.87 MIL/uL — ABNORMAL LOW (ref 3.87–5.11)
RDW: 18.4 % — ABNORMAL HIGH (ref 11.5–15.5)
WBC: 14.4 10*3/uL — ABNORMAL HIGH (ref 4.0–10.5)
nRBC: 0 % (ref 0.0–0.2)

## 2021-05-27 LAB — CULTURE, BLOOD (ROUTINE X 2): Culture: NO GROWTH

## 2021-05-27 LAB — RENAL FUNCTION PANEL
Albumin: 2 g/dL — ABNORMAL LOW (ref 3.5–5.0)
Anion gap: 9 (ref 5–15)
BUN: 23 mg/dL (ref 8–23)
CO2: 21 mmol/L — ABNORMAL LOW (ref 22–32)
Calcium: 8.7 mg/dL — ABNORMAL LOW (ref 8.9–10.3)
Chloride: 103 mmol/L (ref 98–111)
Creatinine, Ser: 4.73 mg/dL — ABNORMAL HIGH (ref 0.44–1.00)
GFR, Estimated: 9 mL/min — ABNORMAL LOW (ref >60)
Glucose, Bld: 140 mg/dL — ABNORMAL HIGH (ref 70–99)
Phosphorus: 1.8 mg/dL — ABNORMAL LOW (ref 2.5–4.6)
Potassium: 3.8 mmol/L (ref 3.5–5.1)
Sodium: 133 mmol/L — ABNORMAL LOW (ref 135–145)

## 2021-05-27 LAB — GLUCOSE, CAPILLARY
Glucose-Capillary: 115 mg/dL — ABNORMAL HIGH (ref 70–99)
Glucose-Capillary: 126 mg/dL — ABNORMAL HIGH (ref 70–99)
Glucose-Capillary: 127 mg/dL — ABNORMAL HIGH (ref 70–99)
Glucose-Capillary: 141 mg/dL — ABNORMAL HIGH (ref 70–99)

## 2021-05-27 MED ORDER — CEFEPIME HCL 1 G IJ SOLR
1.0000 g | INTRAMUSCULAR | Status: AC
  Administered 2021-05-27 – 2021-05-28 (×2): 1 g via INTRAVENOUS
  Filled 2021-05-27 (×2): qty 1

## 2021-05-27 NOTE — Progress Notes (Signed)
Treatment in pause. Blood returned to patient to prevent blood loss. Will attempt cath flo for malfunctioning catheter.

## 2021-05-27 NOTE — Progress Notes (Signed)
Cathflo instilled for catheter dwell.

## 2021-05-27 NOTE — Progress Notes (Signed)
UF remains off. Patient alert.

## 2021-05-27 NOTE — Progress Notes (Signed)
BP re-checked.

## 2021-05-27 NOTE — Progress Notes (Signed)
Cath flow removed from catheter. Treatment reinitiated. High arterial pressure alarms Lines reversed.

## 2021-05-27 NOTE — Progress Notes (Signed)
PCCM Progress Note  Patient with son and daughter-in-law at bedside stated she wishes to be DNR. Will change code status tonight. Recommended continuing discussions regarding goals of care in the am. For now will transition to DNR with no chest compressions, no ventilation. Continue vasopressors for now.  Rodman Pickle, M.D. [facility] Pulmonary/Critical Care Medicine 05/27/2021 6:53 PM   See Amion for personal pager For hours between 7 PM to 7 AM, please call [place] for urgent questions

## 2021-05-27 NOTE — Progress Notes (Signed)
Palliative Medicine Inpatient Follow Up Note  Consulting Provider: Kipp Brood, MD   Reason for consult:   [place] Palliative Medicine Consult  Reason for Consult? progressive decline in elderly HD patient. son may have difficulty accepting    HPI:  Per intake H&P --> 75 year old woman who is critically ill due to vasoplegic shock due to presumed sepsis although no clear source found.  She has a more complicated history of longstanding ESRD on hemodialysis with recent development of hypotension requiring initiation of midodrine.  She has had generalized weakness and is now nursing home bound.   Palliative care has been asked to get involved to discuss goals of care in the setting of a progressively declining functional and overall poor health  Today's Discussion (05/27/2021):  *Please note that this is a verbal dictation therefore any spelling or grammatical errors are due to the "[place] [facility]" system interpretation.  Chart reviewed.  I met with [place], this afternoon. She was about to receive her HD treatment. She was noted to have a robust amount oral secretions which were suctioned with her Y[place]auer.   We reviewed the conversation from the previous day. I shared that I realize this was a hard conversation and it is difficult to think about the future. We reviewed that one of the topics which is of high priority is that of Code status. I shared again my concerns that Teryl would likely do poorly in the setting of a cardiopulmonary resuscitation. She spent a long time thinking while I was at bedside which I utilized silence in observation of. She asked me "do I need to make a decision now." I told her no and we discussed that these are difficult decisions to make and she should take her time to make it. I emphasized the importance as this matters are easier for family members if they know the wishes of their loved ones. Shaleen seemed to understand  that. She states "I will have an answer by Tuesday." I asked her to really think about what her true wishes would be. I conferred that we can readdress this topic on Tuesday.   Palliative support was provided. Shirelle is having a hard time coming to terms with her overall health decline.   Objective Assessment: Vital Signs Vitals:   05/27/21 1331 05/27/21 1333  BP: (!) 78/36 (!) 95/36  Pulse: (!) 101 (!) 107  Resp: 14 18  Temp:    SpO2: 100% 98%    Intake/Output Summary (Last 24 hours) at 05/27/2021 1346 Last data filed at 05/27/2021 0800 Gross per 24 hour  Intake 1763.22 ml  Output 0 ml  Net 1763.22 ml    Last Weight  Most recent update: 05/27/2021  1:06 PM    Weight  64.2 kg (141 lb 8.6 oz)            Gen: Frail elderly female in no acute distress HEENT: Dry mucous membranes CV: Regular rate and irregular rhythm  PULM:  2LPM [place] ABD: soft/nontender  EXT: Edema noted in bilateral upper extremities, (+) weeping Neuro: Alert and oriented x2-3, very soft spoken   SUMMARY OF RECOMMENDATIONS   Full Code / Full scope of care --> Patient would like to think about DNR and will have an answer in regards to this on Tuesday   Continue with current level of care support  Patient would benefit from having HCPOA documents as she has three sons one of whom disagrees with the other two.  I will request our chaplain complete these   Ongoing support incrementally, we will follow up on Tuesday  Time Spent: 32 Greater than 50% of the time was spent in counseling and coordination of care ______________________________________________________________________________________ [place] Team Team Cell Phone: [phone] Please utilize secure chat with additional questions, if there is no response within 30 minutes please call the above phone number  Palliative Medicine Team providers are available by phone from 7am to 7pm daily and can be  reached through the team cell phone.  Should this patient require assistance outside of these hours, please call the patient's attending physician.

## 2021-05-27 NOTE — Progress Notes (Signed)
PHARMACY NOTE:  ANTIMICROBIAL RENAL DOSAGE ADJUSTMENT  Current antimicrobial regimen includes a mismatch between antimicrobial dosage and estimated renal function.  As per policy approved by the Pharmacy & Therapeutics and Medical Executive Committees, the antimicrobial dosage will be adjusted accordingly.  Current antimicrobial dosage:  Cefepime 2g with iHD on TTS  Indication: sepsis  Renal Function:  Estimated Creatinine Clearance: 7.7 mL/min (A) (by C-G formula based on SCr of 4.73 mg/dL (H)). [x]      On intermittent HD, now off schedule and iHD not completed on 10/22 but cefepime administered on 10/22 PM. Plan for iHD today    Antimicrobial dosage has been changed to:  cefepime 1g q24h  Additional comments:   Thank you for allowing pharmacy to be a part of this patient's care.  Cristela Felt, PharmD, BCPS Clinical Pharmacist 05/27/2021 12:10 PM

## 2021-05-27 NOTE — Progress Notes (Signed)
   05/27/21 1543  Clinical Encounter Type  Visited With Patient  Visit Type Initial;Social support  Referral From Nurse  Consult/Referral To Chaplain   Chaplain responded to referral from Pt's nurse, Lattie Haw. Pt was unable to say much during the visit, so chaplain provided space for silence and reflection. Pt shared she was in pain and Chaplain relayed this information to Pt's nurse. Chaplain remains available.  This note was prepared by Marijo File, MDiv. Chaplain remains available as needed through the on-call pager: [phone].

## 2021-05-27 NOTE — Progress Notes (Signed)
Patient with hypotension during treatment. Patient States she feels sick. UF turned off.  Tobie Poet notified. If bp continues to decrease, then okay to discontinue treatment. Patient currently alert. Family member at bedside.

## 2021-05-27 NOTE — Progress Notes (Signed)
UF off.

## 2021-05-27 NOTE — Progress Notes (Signed)
Uf goal decreased.

## 2021-05-27 NOTE — Progress Notes (Signed)
Patient with malfunctioning HD access. Unable to achieve maintain prescribed blood flow rate due to high arterial pressures. Patient was repositioned, lines reversed, and access power flushed with no improvement. Treatment paused and cath flow instilled for dwell per protocol. Tobie Poet, Nurse Practitioner, notified.

## 2021-05-27 NOTE — Progress Notes (Signed)
BP rechecked. Uf goal decreased.

## 2021-05-27 NOTE — Progress Notes (Signed)
Bp rechecked.  

## 2021-05-27 NOTE — Progress Notes (Signed)
NAME:  [name], MRN:  [id number], DOB:  02/09/1946, LOS: 5 ADMISSION DATE:  05/21/2021, CONSULTATION DATE:  05/27/2021  REFERRING MD:  Horris Latino TRH, CHIEF COMPLAINT:  low BP   History of Present Illness:  75 year old ESRD on hemodialysis, nursing home resident, brought in for generalized weakness, shortness of breath mental status. History obtained from son [name] at the bedside and reviewing chart. Last dialysis was 5 days ago , she is a difficult access and dialyzed via left permacath ED visit noted 10/3, chronic left upper extremity swelling, duplex was negative Son also reports sacral decubitus , she was noted to desaturate today and sent to the emergency room, requiring 2 L oxygen.  Initial blood pressure was 409 systolic but then dropped to the 80s.  On my arrival again 103/88 Labs showed WBC count of 18 K with left shift, she is noted to have chronic leukocytosis, stable anemia, BUN and creatinine have increased  Pertinent  Medical History  ESRD on HD Diabetes type 2 Large hiatal hernia Hypertension OSA Atrial fibrillation COPD CAD, Echo 03/2021 normal LVEF grade 1 diastolic dysfunction  Significant Hospital Events: Including procedures, antibiotic start and stop dates in addition to other pertinent events   10/18 hypotensive, started on Levophed peripheral , dialyzed 10/19 left femoral line placed and transferred to [facility] for [facility]  Interim History / Subjective:  Remains levophed dependent, increased from 4>12 this am  Objective   Blood pressure (!) 99/46, pulse 100, temperature 99.4 F (37.4 C), temperature source Oral, resp. rate (!) 24, height 4\' 8"  (1.422 m), weight 65.4 kg, SpO2 100 %.        Intake/Output Summary (Last 24 hours) at 05/27/2021 0922 Last data filed at 05/27/2021 0600 Gross per 24 hour  Intake 1740.78 ml  Output 0 ml  Net 1740.78 ml   Filed Weights   05/24/2021 1859 05/24/21 0400 05/24/21 1525  Weight: 66.1 kg 65.4 kg 65.4 kg     Physical Exam: General: Chronically ill-appearing, no acute distress, awake and alert HENT: [place], AT, OP clear, MMM Eyes: EOMI, no scleral icterus Respiratory: Diminished breath sounds bilaterally.  No crackles, wheezing or rales Cardiovascular: RRR, -M/R/G, no JVD GI: BS+, soft, nontender Extremities: Upper extremity edema,-tenderness Neuro: AAO x2, CNII-XII grossly intact, follows commands GU: Foley in place  WBC 14.4, increased BMET pending  Patient Lines/Drains/Airways Status     Active Line/Drains/Airways     Name Placement date Placement time Site Days   Peripheral IV 05/05/2021 20 G 1.88" Right Antecubital 05/27/2021  1650  Antecubital  4   CVC Triple Lumen 05/23/21 Left Femoral 05/23/21  1000  -- 3   Hemodialysis Catheter Left Subclavian 01/15/21  1019  Subclavian  131   Pressure Injury 05/13/2021 Sacrum Lower;Mid Stage 3 -  Full thickness tissue loss. Subcutaneous fat may be visible but bone, tendon or muscle are NOT exposed. 7.5 cm x 2.5cm covered with yellow slough 05/07/2021  1900  -- 4             Resolved Hospital Problem list     Assessment & Plan:  Ongoing shock, acute on chronic secondary to possible colitis - Pct mildly elevated and pressures borderline; CT read for questionable colitis; no signs of left abdominal wall cellulitis or hemorrhage as suggested by read. - Wean levophed for SBP >85 - Continue midodrine 15 mg TID - Renally dose Cefepime for total 7-10 days  Hypoxemia- related to poor mobility and atelectasis - Wean supplemental oxygen for  goal SpO2 >88% - Pulmonary hygiene  Afib - Amio 200 mg daily - Eliquis  End-stage renal failure on HD with difficult IV access - Nephrology following. TTS schedule. Dialysis today for volume overload  Severe muscular deconditioning- essentially bedbound, sacral pressure injury POA  Progressive FTT Frail elderly - Palliative following. Family considering DNR   Best Practice (right click and "Reselect  all SmartList Selections" daily)   Diet/type: dysphagia diet (see orders) DVT prophylaxis: DOAC GI prophylaxis: N/A Lines: Central line and Dialysis Catheter Foley:  N/A Code Status:  full code Last date of multidisciplinary goals of care discussion [pending with palliative, appreciate help]   The patient is critically ill with multiple organ systems failure and requires high complexity decision making for assessment and support, frequent evaluation and titration of therapies, application of advanced monitoring technologies and extensive interpretation of multiple databases.  Independent Critical Care Time: 60 Minutes.   Rodman Pickle, M.D. [facility] Pulmonary/Critical Care Medicine 05/27/2021 9:22 AM   Please see Amion for pager number to reach on-call Pulmonary and Critical Care Team.

## 2021-05-27 NOTE — Progress Notes (Signed)
Frequent arterial pressure alarms. Access power flushed lines reversed.

## 2021-05-27 NOTE — Progress Notes (Signed)
[facility] Progress Note  Subjective: seen in ICU , levo gtt up to 12 ug/min. Pt sleepy but arouses easily. No c/o's.   Vitals:   05/27/21 0400 05/27/21 0500 05/27/21 0600 05/27/21 0732  BP: (!) 99/52 (!) 99/46    Pulse: (!) 104 (!) 106 100   Resp: 19 (!) 24 (!) 24   Temp: 98.7 F (37.1 C)   99.4 F (37.4 C)  TempSrc: Oral   Oral  SpO2: 99% 100% 100%   Weight:      Height:        Exam:  alert, nad , frail , tired and deconditioned  no jvd  Chest cta bilat  Cor reg no RG  Abd soft ntnd no ascites   Ext 2+ bilat UE edema, no LE edema   Awake, tired, lethargic, nonfocal   L IJ TDC     OP HD: TTS [facility]   4h  400/800   67kg  2/2.5 bath  Hep none  LIJ TDC  - mircera 200 q2 last 10/10  - calc 1.25 ug tiw po  CXR 10/18 - IMPRESSION: 1. Small left pleural effusion and bibasilar atelectasis. 2. Large hiatal hernia.  Assessment/ Plan: Shock: Presumed sepsis, getting IV abx vanc/ cefepime. Possible colitis per CT. Blood cx's negative. WBC improving. Home midodrine ^'d to 15 mg tid here, also continues on levo gtt. ESRD: TTS HD. HD today off schedule (postponed from yesterday).  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 Received Mircera at OP HD on 10/10, due on 10/25.  Transfuse as needed BP/ volume overload: chronically low BP's on midodrine at home. 2kg under dry wt but w/ diffuse UE/ facial edema.  CXR clear. UF as BP tolerates w/ HD.  Atrial feb: on amio and eliquis Metabolic Bone Disease: Ca in range, phos is low. Cont vdra po tiw. No binder needed.  FTT - sig decline this past year. Palliative care team consulting, appreciate assistance.      Rob Ranisha Allaire 05/27/2021, 11:25 AM   Recent Labs  Lab 05/26/21 0407 05/26/21 0500 05/27/21 0838  K  --  3.1* 3.8  BUN  --  15 23  CREATININE  --  3.50* 4.73*  CALCIUM  --  7.2* 8.7*  PHOS  --  1.5* 1.8*  HGB 8.3*  --  8.5*    Inpatient medications:  allopurinol  100 mg Oral QHS   amiodarone  200 mg Oral q  AM   apixaban  2.5 mg Oral BID   atorvastatin  40 mg Oral q1800   calcitRIOL  1.25 mcg Oral Q T,Th,Sa-HD   chlorhexidine  15 mL Mouth Rinse BID   Chlorhexidine Gluconate Cloth  6 each Topical Q0600   collagenase  1 application Topical Daily   feeding supplement  1 Container Oral TID BM   ferrous sulfate  325 mg Oral QHS   insulin aspart  0-9 Units Subcutaneous TID WC   mouth rinse  15 mL Mouth Rinse q12n4p   midodrine  15 mg Oral TID WC   mirtazapine  7.5 mg Oral QHS   mometasone-formoterol  2 puff Inhalation BID   multivitamin  1 tablet Oral QHS   pantoprazole  80 mg Oral BID AC    sodium chloride     sodium chloride     sodium chloride 250 mL (05/14/2021 1710)   ceFEPime (MAXIPIME) IV 2 g (05/26/21 1750)   norepinephrine (LEVOPHED) Adult infusion 12 mcg/min (05/27/21 0600)   sodium chloride  sodium chloride, sodium chloride, acetaminophen **OR** acetaminophen, acetaminophen, albuterol, alteplase, heparin, HYDROcodone-acetaminophen, lidocaine (PF), lidocaine-prilocaine, ondansetron **OR** ondansetron (ZOFRAN) IV, pentafluoroprop-tetrafluoroeth, polyethylene glycol, polyvinyl alcohol

## 2021-05-28 ENCOUNTER — Ambulatory Visit: Payer: Medicare Other | Admitting: Family Medicine

## 2021-05-28 DIAGNOSIS — R0602 Shortness of breath: Secondary | ICD-10-CM | POA: Diagnosis not present

## 2021-05-28 LAB — RENAL FUNCTION PANEL
Albumin: 2.1 g/dL — ABNORMAL LOW (ref 3.5–5.0)
Anion gap: 10 (ref 5–15)
BUN: 9 mg/dL (ref 8–23)
CO2: 23 mmol/L (ref 22–32)
Calcium: 8.3 mg/dL — ABNORMAL LOW (ref 8.9–10.3)
Chloride: 101 mmol/L (ref 98–111)
Creatinine, Ser: 2.88 mg/dL — ABNORMAL HIGH (ref 0.44–1.00)
GFR, Estimated: 16 mL/min — ABNORMAL LOW (ref >60)
Glucose, Bld: 173 mg/dL — ABNORMAL HIGH (ref 70–99)
Phosphorus: 1.9 mg/dL — ABNORMAL LOW (ref 2.5–4.6)
Potassium: 4 mmol/L (ref 3.5–5.1)
Sodium: 134 mmol/L — ABNORMAL LOW (ref 135–145)

## 2021-05-28 LAB — CBC WITH DIFFERENTIAL/PLATELET
Abs Immature Granulocytes: 0.18 10*3/uL — ABNORMAL HIGH (ref 0.00–0.07)
Basophils Absolute: 0 10*3/uL (ref 0.0–0.1)
Basophils Relative: 0 %
Eosinophils Absolute: 0 10*3/uL (ref 0.0–0.5)
Eosinophils Relative: 0 %
HCT: 27.7 % — ABNORMAL LOW (ref 36.0–46.0)
Hemoglobin: 9 g/dL — ABNORMAL LOW (ref 12.0–15.0)
Immature Granulocytes: 1 %
Lymphocytes Relative: 5 %
Lymphs Abs: 0.7 10*3/uL (ref 0.7–4.0)
MCH: 29.4 pg (ref 26.0–34.0)
MCHC: 32.5 g/dL (ref 30.0–36.0)
MCV: 90.5 fL (ref 80.0–100.0)
Monocytes Absolute: 1 10*3/uL (ref 0.1–1.0)
Monocytes Relative: 7 %
Neutro Abs: 11.9 10*3/uL — ABNORMAL HIGH (ref 1.7–7.7)
Neutrophils Relative %: 87 %
Platelets: 188 10*3/uL (ref 150–400)
RBC: 3.06 MIL/uL — ABNORMAL LOW (ref 3.87–5.11)
RDW: 18.7 % — ABNORMAL HIGH (ref 11.5–15.5)
WBC: 13.8 10*3/uL — ABNORMAL HIGH (ref 4.0–10.5)
nRBC: 0 % (ref 0.0–0.2)

## 2021-05-28 LAB — CULTURE, BLOOD (ROUTINE X 2)
Culture: NO GROWTH
Special Requests: ADEQUATE

## 2021-05-28 LAB — GLUCOSE, CAPILLARY
Glucose-Capillary: 176 mg/dL — ABNORMAL HIGH (ref 70–99)
Glucose-Capillary: 128 mg/dL — ABNORMAL HIGH (ref 70–99)
Glucose-Capillary: 138 mg/dL — ABNORMAL HIGH (ref 70–99)

## 2021-05-28 MED ORDER — MIDODRINE HCL 5 MG PO TABS
20.0000 mg | ORAL_TABLET | Freq: Three times a day (TID) | ORAL | Status: DC
  Administered 2021-05-28 – 2021-05-30 (×4): 20 mg via ORAL
  Filled 2021-05-28 (×4): qty 4

## 2021-05-28 MED ORDER — SENNOSIDES-DOCUSATE SODIUM 8.6-50 MG PO TABS
1.0000 | ORAL_TABLET | Freq: Once | ORAL | Status: AC
  Administered 2021-05-28: 1 via ORAL
  Filled 2021-05-28: qty 1

## 2021-05-28 MED ORDER — SODIUM PHOSPHATES 45 MMOLE/15ML IV SOLN
30.0000 mmol | Freq: Once | INTRAVENOUS | Status: AC
  Administered 2021-05-28: 30 mmol via INTRAVENOUS
  Filled 2021-05-28: qty 10

## 2021-05-28 MED ORDER — POLYETHYLENE GLYCOL 3350 17 G PO PACK
17.0000 g | PACK | Freq: Two times a day (BID) | ORAL | Status: DC
  Administered 2021-05-29: 17 g via ORAL
  Filled 2021-05-28: qty 1

## 2021-05-28 MED ORDER — ACETAMINOPHEN 650 MG RE SUPP
325.0000 mg | Freq: Once | RECTAL | Status: AC
  Administered 2021-05-28: 325 mg via RECTAL
  Filled 2021-05-28: qty 1

## 2021-05-28 MED ORDER — HYDROCORTISONE 10 MG PO TABS
10.0000 mg | ORAL_TABLET | Freq: Two times a day (BID) | ORAL | Status: DC
  Administered 2021-05-29: 10 mg via ORAL
  Filled 2021-05-28 (×5): qty 1

## 2021-05-28 NOTE — Progress Notes (Signed)
[facility] Progress Note Patient Name: [name] DOB: 07/09/1946 MRN: [id number]   Date of Service  05/28/2021  HPI/Events of Note  Discussed with RN about pain issues. Body pains. On vicodine during day. Can not swallow- chokes. She is on 2 lit o2. Mental status waxes and out. BMI 34.  Cr 2.88, HD 3x a week  eICU Interventions  NPO for tonight Asp precautions Tylenol PR trial first.      Intervention Category Intermediate Interventions: Pain - evaluation and management  Elmer Sow 05/28/2021, 9:42 PM

## 2021-05-28 NOTE — Progress Notes (Signed)
[facility] NEPHROLOGY PROGRESS NOTE  Assessment/ Plan:   OP HD: TTS [facility]   4h  400/800   67kg  2/2.5 bath  Hep none  [facility] TDC  - mircera 200 q2 last 10/10  - calc 1.25 ug tiw po  # Shock: Presumed sepsis, getting IV cefepime. Possible colitis per CT. Blood cx's negative. WBC improving.  Continue to require Levophed IV and home midodrine ^'d to 15 mg tid here.  # ESRD: TTS HD.  Status post HD on 10/23 off schedule.  Noted patient is DNR/DNI and goals of care ongoing.  We will assess tomorrow morning for HD need.  The dialysis has been challenging outpatient because of hypotension.  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 Received Mircera at OP HD on 10/10.  Start Aranesp weekly in the hospital.  # BP/ volume overload: chronically low BP's on midodrine at home.  Still looks volume up on exam.  Difficult ultrafiltration because of hypotension.  Currently on Levophed as above.  # Atrial feb: on amio and eliquis.  # Metabolic Bone Disease: Ca in range, phos is low. Cont vdra po tiw. No binder needed.  Repleted IV phosphorus for hypophosphatemia.  # FTT - sig decline this past year. Palliative care team consulting, appreciate assistance.  Currently DNR/DNI.  Goals of care discussion ongoing.  Subjective: Seen and examined ICU.  Patient is on IV Levophed with acceptable BP.  Noted patient is DNR/DNI.  Last HD completed yesterday.  Patient is very lethargic and unable to participate in discussion except opening eyes and occasionally nodding head.  Primary team at bedside. Objective Vital signs in last 24 hours: Vitals:   05/28/21 0700 05/28/21 0715 05/28/21 0724 05/28/21 0822  BP: (!) 121/40 (!) 119/40    Pulse: 86 87    Resp: 15 15    Temp:   99 F (37.2 C)   TempSrc:   Axillary   SpO2: 100% 100%  100%  Weight:      Height:       Weight change:   Intake/Output Summary (Last 24 hours) at 05/28/2021 0832 Last data filed at 05/28/2021 0700 Gross per 24 hour  Intake  396.76 ml  Output 532 ml  Net -135.24 ml       Labs: Basic Metabolic Panel: Recent Labs  Lab 05/26/21 0500 05/27/21 0838 05/28/21 0439  NA 135 133* 134*  K 3.1* 3.8 4.0  CL 109 103 101  CO2 20* 21* 23  GLUCOSE 109* 140* 173*  BUN 15 23 9   CREATININE 3.50* 4.73* 2.88*  CALCIUM 7.2* 8.7* 8.3*  PHOS 1.5* 1.8* 1.9*   Liver Function Tests: Recent Labs  Lab 05/24/21 0639 05/25/21 0352 05/26/21 0500 05/27/21 0838 05/28/21 0439  AST 13*  --   --   --   --   ALT 11  --   --   --   --   ALKPHOS 87  --   --   --   --   BILITOT 1.0  --   --   --   --   PROT 5.2*  --   --   --   --   ALBUMIN 2.3*   < > 1.6* 2.0* 2.1*   < > = values in this interval not displayed.   No results for input(s): LIPASE, AMYLASE in the last 168 hours. No results for input(s): AMMONIA in the last 168 hours. CBC: Recent Labs  Lab 05/24/21 [phone] 05/25/21 0352 05/26/21 0407 05/27/21  4259 05/28/21 0439  WBC 16.4* 15.7* 10.4 14.4* 13.8*  NEUTROABS 14.0* 13.9* 9.0* 12.5* 11.9*  HGB 9.0* 8.9* 8.3* 8.5* 9.0*  HCT 27.8* 27.8* 25.4* 26.2* 27.7*  MCV 92.4 92.1 91.4 91.3 90.5  PLT 241 175 142* 196 188   Cardiac Enzymes: No results for input(s): CKTOTAL, CKMB, CKMBINDEX, TROPONINI in the last 168 hours. CBG: Recent Labs  Lab 05/27/21 0023 05/27/21 0732 05/27/21 1117 05/27/21 1535 05/28/21 0724  GLUCAP 115* 127* 141* 126* 176*    Iron Studies: No results for input(s): IRON, TIBC, TRANSFERRIN, FERRITIN in the last 72 hours. Studies/Results: No results found.  Medications: Infusions:  sodium chloride     sodium chloride     sodium chloride 250 mL (05/21/2021 1710)   ceFEPime (MAXIPIME) IV 1 g (05/27/21 1502)   norepinephrine (LEVOPHED) Adult infusion 12 mcg/min (05/28/21 0700)   sodium chloride     sodium phosphate  Dextrose 5% IVPB 43 mL/hr at 05/28/21 0700    Scheduled Medications:  allopurinol  100 mg Oral QHS   amiodarone  200 mg Oral q AM   apixaban  2.5 mg Oral BID    atorvastatin  40 mg Oral q1800   calcitRIOL  1.25 mcg Oral Q T,Th,Sa-HD   chlorhexidine  15 mL Mouth Rinse BID   Chlorhexidine Gluconate Cloth  6 each Topical Q0600   collagenase  1 application Topical Daily   feeding supplement  1 Container Oral TID BM   ferrous sulfate  325 mg Oral QHS   insulin aspart  0-9 Units Subcutaneous TID WC   mouth rinse  15 mL Mouth Rinse q12n4p   midodrine  15 mg Oral TID WC   mirtazapine  7.5 mg Oral QHS   mometasone-formoterol  2 puff Inhalation BID   multivitamin  1 tablet Oral QHS   pantoprazole  80 mg Oral BID AC    have reviewed scheduled and prn medications.  Physical Exam: General: critically ill looking female lying on bed, opens eyes and somnolent Heart:RRR, s1s2 nl Lungs: Distant breath sound Abdomen:soft, Non-tender,  Extremities: Both upper and lower extremities edema present Dialysis Access: Left IJ TDC in place.  [name] [name] 05/28/2021,8:32 AM  LOS: 6 days

## 2021-05-28 NOTE — TOC Initial Note (Signed)
Transition of Care [facility]) - Initial/Assessment Note    Patient Details  Name: [name] MRN: [id number] Date of Birth: 11/17/1945  Transition [facility]) CM/SW Contact:    Tom-Johnson, Renea Ee, RN Phone Number: 05/28/2021, 4:44 PM  Clinical Narrative:                 CM went in patient's room to talk to her about TOC needs at discharge.Patient in bed very lethargic. Son, [name] in room and he answered questions. [name] states patient lived with him and he cared for her until she was admitted at [facility]. States patient has four children and all supportive. Patient is a retired Environmental manager. Prior to SNF, patient has a walker, cane, shower seat, bedside commode, uses home oxygen/ BIPAP at night. Gets supplies from [employer]. Palliative consulting. CM will continue to follow with needs.    Expected Discharge Plan: Skilled Nursing Facility Barriers to Discharge: Continued Medical Work up   Patient Goals and CMS Choice Patient states their goals for this hospitalization and ongoing recovery are:: To return to SNF CMS Medicare.gov Compare Post Acute Care list provided to:: Patient Represenative (must comment) (Son, [name])    Expected Discharge Plan and Services Expected Discharge Plan: [place]   Discharge Planning Services: CM Consult Post Acute Care Choice: [place] Living arrangements for the past 2 months: [place] Expected Discharge Date: 05/25/21                                    Prior Living Arrangements/Services Living arrangements for the past 2 months: [place] Lives with:: Facility Resident Patient language and need for interpreter reviewed:: Yes Do you feel safe going back to the place where you live?: Yes      Need for Family Participation in Patient Care: Yes (Comment) Care giver support system in place?: Yes (comment) Current home services: DME (Portable  oxygen, bedside commode, walker, BIPAP, shower seat.) Criminal Activity/Legal Involvement Pertinent to Current Situation/Hospitalization: No - Comment as needed  Activities of Daily Living Home Assistive Devices/Equipment: Wheelchair ADL Screening (condition at time of admission) Patient's cognitive ability adequate to safely complete daily activities?: Yes Is the patient deaf or have difficulty hearing?: No Does the patient have difficulty seeing, even when wearing glasses/contacts?: No Does the patient have difficulty concentrating, remembering, or making decisions?: No Patient able to express need for assistance with ADLs?: Yes Does the patient have difficulty dressing or bathing?: No Independently performs ADLs?: Yes (appropriate for developmental age) Does the patient have difficulty walking or climbing stairs?: Yes Weakness of Legs: None Weakness of Arms/Hands: None  Permission Sought/Granted Permission sought to share information with : Case Manager, Customer service manager, Family Supports Permission granted to share information with : Yes, Verbal Permission Granted              Emotional Assessment Appearance:: Appears stated age Attitude/Demeanor/Rapport: Lethargic Affect (typically observed): Accepting Orientation: : Oriented to Self Alcohol / Substance Use: Not Applicable Psych Involvement: No (comment)  Admission diagnosis:  SOB (shortness of breath) [R06.02] Hypoxia [R09.02] Sepsis ([place]) [A41.9] Patient Active Problem List   Diagnosis Date Noted   Protein-calorie malnutrition, severe 05/24/2021   Pressure injury of skin 05/23/2021   SOB (shortness of breath) 05/18/2021   Septic shock ([place]) 05/29/2021   Edema of left upper arm 05/08/2021   Generalized weakness 05/08/2021  Hypothyroidism, unspecified 04/23/2021   Diverticulitis    Non-intractable vomiting    Anticoagulated 03/05/2021   GI bleed 02/25/2021   Acute blood loss anemia 02/25/2021    Hypotension 02/25/2021   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) 07/20/2020   Acute respiratory failure with hypoxia ([place]) 07/20/2020   Allergy, unspecified, sequela 06/28/2020   Personal history of anaphylaxis 06/28/2020   Recurrent falls 04/25/2020   Fall 04/24/2020   ESRD on hemodialysis [facility])    Essential hypertension    Dyslipidemia    DM (diabetes mellitus), type 2 with renal complications (HCC)    Class 2 obesity with body mass index (BMI) of 35 to 39.9 without comorbidity    OSA (obstructive sleep apnea)    COPD (chronic obstructive pulmonary disease) (HCC)    PAF (paroxysmal atrial fibrillation) ([place])    Acute respiratory distress 03/09/2020   Fluid overload, unspecified 03/09/2020   Hyperkalemia 03/06/2020   GERD with esophagitis 03/06/2020   Acute bacterial bronchitis 03/06/2020   Anemia in chronic kidney disease 11/17/2019   Coagulation defect, unspecified ([place]) 11/17/2019   Pain, unspecified 11/17/2019   Pruritus, unspecified 11/17/2019   Secondary hyperparathyroidism of renal origin ([place]) 11/17/2019   Unspecified protein-calorie malnutrition ([place]) 11/17/2019   Loss of weight 10/22/2019   Goals of care, counseling/discussion 07/23/2019   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 07/21/2019   Paroxysmal atrial fibrillation ([place]) 07/21/2019   Encephalopathy 07/21/2019   Moderate major depression, single episode ([place]) 07/21/2019   Pressure ulcer 07/20/2019   Anorexia 07/10/2019   Weight gain with edema 07/02/2019   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 ([place]) 06/22/2019   Neuropathy due to type 2 diabetes mellitus ([place]) 06/22/2019   Mixed diabetic hyperlipidemia associated with type 2 diabetes  mellitus ([place]) 06/22/2019   Chronic gout 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 06/22/2019   Quality of life palliative care encounter 06/19/2019   Atrial fibrillation with RVR (HCC)    Atrial flutter with rapid ventricular response ([place]) 06/16/2019   Dyspnea and respiratory abnormalities 06/01/2019   Acute on chronic respiratory failure with hypoxia and hypercapnia with respiratory acidosis 06/01/2019   Anasarca 05/31/2019   Nausea 05/31/2019   Generalized abdominal pain 05/31/2019   History of colonic polyps 05/31/2019   Lymphedema of both lower extremities 04/28/2019   Cellulitis of finger of right hand 08/19/2018   Chronic respiratory failure with hypoxia ([place]) 07/13/2018   Acute renal failure superimposed on stage 3 chronic kidney disease ([place]) 07/13/2018   Chronic diastolic CHF (congestive heart failure) ([place]) 07/13/2018   COPD (chronic obstructive pulmonary disease) ([place]) 07/13/2018   Flatulence 09/18/2017   IBS (irritable bowel syndrome) 04/03/2017   Acute on chronic diastolic CHF (congestive heart failure) ([place]) 05/08/2016   COPD with acute exacerbation ([place]) 05/08/2016   Constipation 04/25/2016   Peripheral vertigo 06/27/2015   Intractable nausea and vomiting 03/29/2015   Diarrhea, unspecified 12/14/2014   Port-A-Cath in place 10/06/2014   Chronic obstructive pulmonary disease ([place]) 04/30/2014   Obesity, Class III, BMI 40-49.9 (morbid obesity) ([place]) 04/30/2014   Hemorrhoids, internal, with bleeding 11/10/2013   Symptomatic anemia 08/18/2013   Other diseases of tongue 08/18/2013   Dark stools 07/27/2013   Chest pain 09/05/2012   Arthropathy 12/09/2011   Edema 12/09/2011   Gout 12/09/2011  Hiatal hernia with GERD and esophagitis    Obstructive chronic bronchitis without exacerbation ([place]) 12/26/2009   Hematochezia 12/26/2009   Dysphagia 12/26/2009   DM type 2 with diabetic peripheral neuropathy ([place]) 12/25/2009   Vitamin B deficiency 12/25/2009   Iron deficiency  anemia due to chronic blood loss 12/25/2009   Benign essential HTN 12/25/2009   DEGENERATIVE [place] DISEASE 12/25/2009   OSA (obstructive sleep apnea) 12/25/2009   HLD (hyperlipidemia) 12/25/2009   Type 2 diabetes mellitus with ESRD (end-stage renal disease) ([place]) 12/25/2009   Narrowing of intervertebral disc space 12/25/2009   PCP:  [name], MD Pharmacy:   [facility] - [address] [place] [address] Phone: [phone] Fax: [place], [place] - [address] AT [address] [place] [address] Phone: [phone] Fax: [phone]     Social Determinants of Health (SDOH) Interventions    Readmission Risk Interventions Readmission Risk Prevention Plan 07/20/2020 04/28/2020 03/08/2020  Transportation Screening Complete Complete Complete  PCP or Specialist Appt within 3-5 Days - Not Complete -  Not Complete comments - going to SNF -  Rafael Hernandez or [place] or Home Care Consult comments - - -  Social Work Consult for [place] Planning/Counseling - Complete -  Palliative Care Screening - Not Applicable -  Medication Review ([place]) Referral to Pharmacy Complete Complete  PCP or Specialist appointment within 3-5 days of discharge Complete - Complete  PCP/Specialist Appt Not Complete comments - - -  HRI or Home Care Consult Complete - Complete  SW Recovery Care/Counseling Consult Complete - Complete  Palliative Care Screening Not Applicable - Not [place] Not Applicable - Not Applicable  Some recent data might be hidden

## 2021-05-28 NOTE — Progress Notes (Signed)
eLink Physician-Brief Progress Note Patient Name: [name] DOB: 10-31-1945 MRN: [id number]   Date of Service  05/28/2021  HPI/Events of Note  Hypophosphatemia - PO4--- = 1.9 and Creatinine = 2.88.  eICU Interventions  Will replace PO4---.     Intervention Category Major Interventions: Electrolyte abnormality - evaluation and management  Lester Platas Eugene 05/28/2021, 5:41 AM

## 2021-05-28 NOTE — Progress Notes (Signed)
This chaplain responded to PMT consult for creating/updating an Advance Directive.  The chaplain reviewed the Pt. chart notes and appreciates the RN-Lisa update.  The chaplain understands the Pt. remains lethargic and unable to complete an AD at this time.    This chaplain offered F/U spiritual care as needed and will partner with PMT for the completion of the Pt. AD.  Chaplain Sallyanne Kuster [phone]

## 2021-05-28 NOTE — Progress Notes (Signed)
Patient has refused blood glucose monitoring. Patient remains asymptomatic of hyper- and hypoglycemia.   Lesle Reek, RN

## 2021-05-28 NOTE — Progress Notes (Signed)
Went to ask patient about pain and give her something to drink. Patient began coughing after taking a sip and stating she felt like she was choking. Patient is moaning when awake and states she hurts "so, so bad." Notified eLink. Awaiting orders. Will continue to monitor.  Lesle Reek, RN

## 2021-05-28 NOTE — Progress Notes (Signed)
NAME:  [name], MRN:  [id number], DOB:  1946-06-25, LOS: 6 ADMISSION DATE:  05/27/2021, CONSULTATION DATE:  05/27/2021 REFERRING MD:  Horris Latino, MD CHIEF COMPLAINT:  Hypotensive    History of Present Illness:  75 year old ESRD on hemodialysis, nursing home resident, brought in for generalized weakness, shortness of breath mental status. History obtained from son [name] at the bedside and reviewing chart. Last dialysis was 5 days ago , she is a difficult access and dialyzed via left permacath ED visit noted 10/3, chronic left upper extremity swelling, duplex was negative Son also reports sacral decubitus , she was noted to desaturate today and sent to the emergency room, requiring 2 L oxygen.  Initial blood pressure was 811 systolic but then dropped to the 80s.  On my arrival again 103/88 Labs showed WBC count of 18 K with left shift, she is noted to have chronic leukocytosis, stable anemia, BUN and creatinine have increased  Past Medical History:  ESRD on HD Diabetes type 2 Large hiatal hernia Hypertension OSA Atrial fibrillation COPD CAD, Echo 03/2021 normal LVEF grade 1 diastolic dysfunction  Significant Hospital Events:  10/18 hypotensive, started on Levophed peripheral , dialyzed 10/19 left femoral line placed and transferred to [facility] for [facility]  Consults:  Palliative Nephro  Procedures:  10/19 left femoral line  Micro Data:  10/19 blood cultures no growth to date  Antimicrobials:  10/18 cefepime and vancomycin for total of 7 days  Interim History / Subjective:  Overnight patient made DNR. Remains on pressors. Had HD yesterday, unable to tolerate sitting up was completed in a stretcher. Patient minimally interactive this morning.   Objective   Blood pressure (!) 124/43, pulse 92, temperature 98 F (36.7 C), temperature source Oral, resp. rate 16, height 4\' 8"  (1.422 m), weight 69 kg, SpO2 100 %.        Intake/Output Summary (Last 24 hours) at  05/28/2021 0315 Last data filed at 05/28/2021 0600 Gross per 24 hour  Intake 389.1 ml  Output 532 ml  Net -142.9 ml   Filed Weights   05/27/21 1300 05/27/21 1845 05/28/21 0600  Weight: 64.2 kg 65 kg 69 kg    Examination: General: chronically ill appearing, NAD, awake and alert HENT: /AT, moist membranes, PERRLA  Lungs: diminished breath sounds bilaterally, on supplemental oxygen Cardiovascular: RRR, no murmurs Abdomen: soft, non tender, bowel sounds present Extremities: upper extremity edema,  Neuro: alert and oriented, following commands GU: foley in place  WBC 13.8 BMP unremarkable Resolved Hospital Problem list     Assessment & Plan:  Ongoing shock, acute on chronic secondary to possible colitis  Procalcitonin was mildly elevated and pressures remain low/borderline.  CT abdomen was questionable for colitis however no other signs of infection.  Patient remains on midodrine and requiring pressor support. - Wean levophed for SBP >85 - Continue midodrine 15 mg TID - Renally dose Cefepime for total 7-10 days   Hypoxemia Related to poor mobility and atelectasis. - Wean supplemental oxygen for goal SpO2 >88% - Pulmonary hygiene   Afib - Amio 200 mg daily - Eliquis   End-stage renal failure on HD with difficult IV access Nephrology following. TTS schedule. Last HD yesterday, unable to tolerate in a seated position, required dialysis in a stretcher.  Unfortunately will not tolerate outpatient HD at this point.  Ongoing discussions with palliative care  Severe muscular deconditioning- essentially bedbound, sacral pressure injury POA   Progressive FTT Frail elderly - Palliative following    Best practice (  evaluated daily)  Diet/type: dysphagia diet (see orders) DVT prophylaxis: DOAC GI prophylaxis: N/A Lines: Central line and Dialysis Catheter Foley:  N/A Code Status:  DNR Last date of multidisciplinary goals of care discussion: Patient made DNR last night, on  going discussions of [place]   Labs   CBC: Recent Labs  Lab 05/24/21 0639 05/25/21 0352 05/26/21 0407 05/27/21 0838 05/28/21 0439  WBC 16.4* 15.7* 10.4 14.4* 13.8*  NEUTROABS 14.0* 13.9* 9.0* 12.5* 11.9*  HGB 9.0* 8.9* 8.3* 8.5* 9.0*  HCT 27.8* 27.8* 25.4* 26.2* 27.7*  MCV 92.4 92.1 91.4 91.3 90.5  PLT 241 175 142* 196 462    Basic Metabolic Panel: Recent Labs  Lab 05/24/21 0639 05/25/21 0352 05/26/21 0500 05/27/21 0838 05/28/21 0439  NA 128* 130* 135 133* 134*  K 3.1* 3.7 3.1* 3.8 4.0  CL 91* 98 109 103 101  CO2 26 24 20* 21* 23  GLUCOSE 221* 119* 109* 140* 173*  BUN 21 9 15 23 9   CREATININE 4.54* 2.75* 3.50* 4.73* 2.88*  CALCIUM 9.2 8.6* 7.2* 8.7* 8.3*  PHOS 2.0* 1.4* 1.5* 1.8* 1.9*   GFR: Estimated Creatinine Clearance: 13 mL/min (A) (by C-G formula based on SCr of 2.88 mg/dL (H)). Recent Labs  Lab 05/15/2021 1656 05/18/2021 1713 05/23/21 0035 05/24/21 7035 05/25/21 0352 05/26/21 0407 05/27/21 0838 05/28/21 0439  PROCALCITON  --  1.31  --  1.19  --   --   --   --   WBC  --   --    < > 16.4* 15.7* 10.4 14.4* 13.8*  LATICACIDVEN 0.5  --   --   --   --   --   --   --    < > = values in this interval not displayed.    Liver Function Tests: Recent Labs  Lab 05/24/21 0093 05/25/21 0352 05/26/21 0500 05/27/21 0838 05/28/21 0439  AST 13*  --   --   --   --   ALT 11  --   --   --   --   ALKPHOS 87  --   --   --   --   BILITOT 1.0  --   --   --   --   PROT 5.2*  --   --   --   --   ALBUMIN 2.3* 2.3* 1.6* 2.0* 2.1*   No results for input(s): LIPASE, AMYLASE in the last 168 hours. No results for input(s): AMMONIA in the last 168 hours.  ABG    Component Value Date/Time   PHART 7.398 05/08/2021 1725   PCO2ART 40.6 05/13/2021 1725   PO2ART 97.8 05/21/2021 1725   HCO3 24.6 05/11/2021 1725   TCO2 26 04/13/2021 1733   ACIDBASEDEF 3.8 (H) 01/30/2007 1830   O2SAT 97.3 05/08/2021 1725     Coagulation Profile: No results for input(s): INR, PROTIME in the  last 168 hours.  Cardiac Enzymes: No results for input(s): CKTOTAL, CKMB, CKMBINDEX, TROPONINI in the last 168 hours.  HbA1C: Hgb A1c MFr Bld  Date/Time Value Ref Range Status  05/24/2021 03:40 PM 4.6 (L) 4.8 - 5.6 % Final    Comment:    (NOTE)         Prediabetes: 5.7 - 6.4         Diabetes: >6.4         Glycemic control for adults with diabetes: <7.0   04/24/2020 06:44 PM 6.0 (H) 4.8 - 5.6 % Final    Comment:    (  NOTE) Pre diabetes:          5.7%-6.4%  Diabetes:              >6.4%  Glycemic control for   <7.0% adults with diabetes     CBG: Recent Labs  Lab 05/26/21 1129 05/27/21 0023 05/27/21 0732 05/27/21 1117 05/27/21 1535  GLUCAP 130* 115* 127* 141* 126*    Past Medical History:  She,  has a past medical history of Anemia, Blood transfusion without reported diagnosis, CAD (coronary artery disease), Cataract, Cervical cancer ([place]) (1978), CHF (congestive heart failure) ([place]), Chronic back pain, Chronic kidney disease, Class 2 obesity with body mass index (BMI) of 35 to 39.9 without comorbidity, Complication of anesthesia, COPD (chronic obstructive pulmonary disease) ([place]), Degenerative disc disease, DM (diabetes mellitus), type 2 with renal complications ([place]), Dyslipidemia, Dyspnea, Dysrhythmia, ESRD (end stage renal disease) on dialysis ([place]), GERD (gastroesophageal reflux disease), Gout, Hiatal hernia (07/27/2013), History of diverticulitis of colon, History of hiatal hernia, HTN (hypertension), Hyperlipidemia, Hypotension, Hypothyroidism, unspecified (04/23/2021), IBS (irritable bowel syndrome), Iron deficiency anemia, Irritable bowel syndrome, Lumbar radiculopathy, MDD (major depressive disorder), Mixed hyperlipidemia, Moderate major depression, single episode ([place]) (07/21/2019), Neuropathy, OSA (obstructive sleep apnea), Osteoporosis, Ovarian cancer ([place]) (1978), Oxygen deficiency, PAF (paroxysmal atrial fibrillation) ([place]), Pneumonia, Sleep apnea, Type 2 diabetes  mellitus ([place]), Vitamin B deficiency (12/25/2009), and Vitamin B12 deficiency.   Surgical History:   Past Surgical History:  Procedure Laterality Date   ABDOMINAL HYSTERECTOMY     APPLICATION OF WOUND VAC Right 01/15/2021   Procedure: APPLICATION OF WOUND VAC;  Surgeon: Cherre Robins, MD;  Location: [place];  Service: Vascular;  Laterality: Right;   AV FISTULA PLACEMENT Left 08/05/2019   Procedure: ARTERIOVENOUS (AV) FISTULA CREATION LEFT ARM;  Surgeon: Waynetta Sandy, MD;  Location: [facility];  Service: Vascular;  Laterality: Left;   AV FISTULA PLACEMENT Left 09/11/2020   Procedure: INSERTION OF ARTERIOVENOUS (AV) GORE-TEX GRAFT ARM LEFT;  Surgeon: Cherre Robins, MD;  Location: [facility];  Service: Vascular;  Laterality: Left;   AV FISTULA PLACEMENT Right 11/29/2020   Procedure: Creation of Arteriovenous Fistula Right Upper Arm;  Surgeon: Rosetta Posner, MD;  Location: [facility];  Service: Vascular;  Laterality: Right;   [place] Left 10/05/2019   Procedure: SECOND STAGE LEFT BASCILIC VEIN TRANSPOSITION;  Surgeon: Waynetta Sandy, MD;  Location: [facility];  Service: Vascular;  Laterality: Left;   [place] Right 01/15/2021   Procedure: RIGHT UPPER ARM SECOND STAGE [place];  Surgeon: Cherre Robins, MD;  Location: [facility];  Service: Vascular;  Laterality: Right;  PERIPHERAL NERVE BLOCK   Benign breast cysts     BIOPSY  02/26/2021   Procedure: BIOPSY;  Surgeon: Jackquline Denmark, MD;  Location[facility] END[facility]COPY;  Service: Endoscopy;;   CHOLECYSTECTOMY     COLONOSCOPY  10/01/2006   SLF:Pan colonic diverticulosis and moderate internal hemorrhoids/ Otherwise no polyps, masses, inflammatory changes or AVMs/   COLONOSCOPY  2011   SLF: pancolonic diverticulosis, large internal hemorrhoids   COLONOSCOPY N/A 01/26/2016   Procedure: COLONOSCOPY;  Surgeon: [name], MD;  [facility];  Service: Endoscopy;  Laterality:  N/A;  830    COLONOSCOPY WITH PROPOFOL N/A 02/10/2020   Procedure: COLONOSCOPY WITH PROPOFOL;  Surgeon: Daneil Dolin, MD;  [facility];  Service: Endoscopy;  Laterality: N/A;  10:45am   ESOPHAGEAL DILATION  02/26/2021   Procedure: ESOPHAGEAL  DILATION;  Surgeon: Jackquline Denmark, MD;  Location: [facility] ENDOSCOPY;  Service: Endoscopy;;   ESOPHAGOGASTRODUODENOSCOPY  11/19/2006   SLF: Large hiatal hernia without evidence of [place] ulcers/. Distal esophageal stricture, which allowed the gastroscope to pass without resistance.  A 16 mm Savary later passed with mild resistance/ Normal stomach.sb bx negative   ESOPHAGOGASTRODUODENOSCOPY  10/01/2006   MHD:QQIWL hiatal hernia.  Distal esophagus without evidence of   erythema, ulceration or Barrett's esophagus   ESOPHAGOGASTRODUODENOSCOPY  2011   SLF: large hh, distal esophageal web narrowing to 55mm s/p dilation to 5mm   ESOPHAGOGASTRODUODENOSCOPY N/A 08/06/2013   SLF: 1. Stricture at the gastroesophageal junction 2. large hiatal hernia. 3. Mild erosive gastritis.   ESOPHAGOGASTRODUODENOSCOPY (EGD) WITH PROPOFOL N/A 07/19/2019   rourk: Mild erosive reflux esophagitis.  Mild Schatzki ring status post dilation.  Large hiatal hernia with at least one half of the stomach above the diaphragm.  Gastric mucosa erythematous.   ESOPHAGOGASTRODUODENOSCOPY (EGD) WITH PROPOFOL N/A 02/26/2021   Procedure: ESOPHAGOGASTRODUODENOSCOPY (EGD) WITH PROPOFOL;  Surgeon: Jackquline Denmark, MD;  Location: [facility] ENDOSCOPY;  Service: Endoscopy;  Laterality: N/A;   GIVENS CAPSULE STUDY N/A 08/06/2013   INCOMPLETE-SMALL BOWLE ULCERS   IR FLUORO GUIDE CV LINE LEFT  07/29/2019   IR US GUIDE VASC ACCESS LEFT  07/29/2019   KNEE SURGERY Right    PARTIAL HYSTERECTOMY  1978   PORT-A-CATH REMOVAL Right 11/29/2020   Procedure: REMOVAL OF RIGHT CHEST PORT;  Surgeon: Rosetta Posner, MD;  Location: [facility];  Service: Vascular;  Laterality: Right;   small bowel capsule  2008   negative   SPINE  SURGERY     TONSILLECTOMY AND ADENOIDECTOMY     Two back surgeries/fusion     UMBILICAL HERNIA REPAIR  2010   UPPER EXTREMITY VENOGRAPHY N/A 11/22/2020   Procedure: UPPER EXTREMITY VENOGRAPHY;  Surgeon: Cherre Robins, MD;  Location: [facility] CV LAB;  Service: Cardiovascular;  Laterality: N/A;     Social History:   reports that she quit smoking about 59 years ago. Her smoking use included cigarettes. She started smoking about 60 years ago. She has a 1.00 pack-year smoking history. She has never used smokeless tobacco. She reports that she does not drink alcohol and does not use drugs.   Family History:  Her family history includes Asthma in her mother and sister; Cervical cancer in her mother; Colon cancer in her brother; Diabetes in her brother, mother, and sister; Early death in her father; Heart attack in her brother, maternal grandmother, mother, sister, and another family member; Heart disease in her brother and mother; Kidney failure in her sister; Stroke in her maternal grandmother, mother, and sister; Ulcers in her mother and sister.   Allergies No Known Allergies   Home Medications  Prior to Admission medications   Medication Sig Start Date End Date Taking? Authorizing Provider  acetaminophen (TYLENOL) 325 MG tablet Take 650 mg by mouth daily as needed for headache (pain).   Yes [provider]  allopurinol (ZYLOPRIM) 100 MG tablet Take 1 tablet (100 mg total) by mouth at bedtime. 03/18/21  Yes Aline August, MD  Amino Acids-Protein Hydrolys (FEEDING SUPPLEMENT, PRO-STAT 64,) LIQD Take 30 mLs by mouth 2 (two) times daily with a meal.   Yes [provider]  amiodarone (PACERONE) 200 MG tablet Take 1 tablet (200 mg total) by mouth in the morning. 01/15/21  Yes Dagoberto Ligas, PA-C  apixaban (ELIQUIS) 2.5 MG TABS tablet Take 2.5 mg by mouth 2 (two) times  daily.   Yes [provider]  atorvastatin (LIPITOR) 40 MG tablet Take 40 mg by mouth daily in the  afternoon. 01/15/21  Yes Dagoberto Ligas, PA-C  Biotin 5000 MCG TABS Take 5,000 mcg by mouth in the morning.   Yes [provider]  budesonide-formoterol (SYMBICORT) 160-4.5 MCG/ACT inhaler Inhale 1 puff into the lungs daily.   Yes [provider]  Camphor-Menthol-Methyl Sal (SALONPAS) 3.08-10-08 % PTCH Place 1 patch onto the skin daily as needed (pain).   Yes [provider]  Carboxymethylcellul-Glycerin (CLEAR EYES FOR DRY EYES) 1-0.25 % SOLN Place 1 drop into both eyes daily as needed (dry eys).   Yes [provider]  Cholecalciferol (VITAMIN D3) 50 MCG (2000 UT) TABS Take 2,000 Units by mouth in the morning.   Yes [provider]  esomeprazole (NEXIUM) 40 MG capsule Take 1 capsule (40 mg total) by mouth 2 (two) times daily before a meal. 04/20/21  Yes Nita Sells, MD  ferric citrate (AURYXIA) 1 GM 210 MG(Fe) tablet Take 210 mg by mouth 3 (three) times daily with meals.   Yes [provider]  ferrous sulfate 325 (65 FE) MG EC tablet Take 325 mg by mouth at bedtime.   Yes [provider]  gabapentin (NEURONTIN) 100 MG capsule Take 100 mg by mouth at bedtime. 12/29/19  Yes [provider]  midodrine (PROAMATINE) 10 MG tablet Take 10 mg by mouth 3 (three) times daily.   Yes [provider]  mirtazapine (REMERON) 7.5 MG tablet Take 7.5 mg by mouth at bedtime.   Yes [provider]  Multiple Vitamin (MULTIVITAMIN WITH MINERALS) TABS tablet Take 1 tablet by mouth daily.   Yes [provider]  OXYGEN Inhale 2 L into the lungs See admin instructions. Use every night and as needed during the day   Yes [provider]  promethazine (PHENERGAN) 12.5 MG tablet Take 1 tablet (12.5 mg total) by mouth every 8 (eight) hours as needed for nausea or vomiting. 03/08/21 05/23/21 Yes Carver, Elon Alas, DO  sennosides-docusate sodium (SENOKOT-S) 8.6-50 MG tablet Take 1 tablet by mouth at bedtime.   Yes  [provider]  traMADol (ULTRAM) 50 MG tablet Take 1 tablet (50 mg total) by mouth every 12 (twelve) hours as needed for moderate pain. 05/08/21  Yes Barton Dubois, MD  vitamin C (ASCORBIC ACID) 500 MG tablet Take 500 mg by mouth daily.   Yes [provider]  zinc sulfate 220 (50 Zn) MG capsule Take 220 mg by mouth daily.   Yes [provider]  cefTRIAXone (ROCEPHIN) IVPB Inject 1 g into the vein daily. For 3 days Patient not taking: Reported on 05/23/2021    [provider]  iron sucrose in sodium chloride 0.9 % 100 mL Iron Sucrose (Venofer) 03/22/21 03/21/22  [provider]  Methoxy PEG-Epoetin Beta (MIRCERA IJ) Mircera 05/01/21 04/30/22  [provider]  nitroGLYCERIN (NITROSTAT) 0.4 MG SL tablet Place 0.4 mg under the tongue every 5 (five) minutes x 3 doses as needed for chest pain. 12/07/19   [provider]  Tuberculin PPD (TUBERSOL ID) Inject into the skin. 04/24/21   [provider]     Kamie Korber Laural Golden, DO PGY-3 IMTS

## 2021-05-28 NOTE — Progress Notes (Signed)
Pt currently receives out-pt HD at [facility] on TTS. Pt arrives from snf at 11:45 for 12:00 chair time. Pt admitted from [facility] snf per medical record. Will assist as needed.   Melven Sartorius Renal Navigator [phone]

## 2021-05-29 DIAGNOSIS — R0602 Shortness of breath: Secondary | ICD-10-CM | POA: Diagnosis not present

## 2021-05-29 DIAGNOSIS — Z7189 Other specified counseling: Secondary | ICD-10-CM | POA: Diagnosis not present

## 2021-05-29 DIAGNOSIS — Z515 Encounter for palliative care: Secondary | ICD-10-CM | POA: Diagnosis not present

## 2021-05-29 LAB — CBC WITH DIFFERENTIAL/PLATELET
Abs Immature Granulocytes: 0.16 10*3/uL — ABNORMAL HIGH (ref 0.00–0.07)
Basophils Absolute: 0 10*3/uL (ref 0.0–0.1)
Basophils Relative: 0 %
Eosinophils Absolute: 0 10*3/uL (ref 0.0–0.5)
Eosinophils Relative: 0 %
HCT: 25.3 % — ABNORMAL LOW (ref 36.0–46.0)
Hemoglobin: 8.3 g/dL — ABNORMAL LOW (ref 12.0–15.0)
Immature Granulocytes: 1 %
Lymphocytes Relative: 5 %
Lymphs Abs: 0.8 10*3/uL (ref 0.7–4.0)
MCH: 29.3 pg (ref 26.0–34.0)
MCHC: 32.8 g/dL (ref 30.0–36.0)
MCV: 89.4 fL (ref 80.0–100.0)
Monocytes Absolute: 1.2 10*3/uL — ABNORMAL HIGH (ref 0.1–1.0)
Monocytes Relative: 8 %
Neutro Abs: 12.7 10*3/uL — ABNORMAL HIGH (ref 1.7–7.7)
Neutrophils Relative %: 86 %
Platelets: 201 10*3/uL (ref 150–400)
RBC: 2.83 MIL/uL — ABNORMAL LOW (ref 3.87–5.11)
RDW: 18.8 % — ABNORMAL HIGH (ref 11.5–15.5)
WBC: 14.9 10*3/uL — ABNORMAL HIGH (ref 4.0–10.5)
nRBC: 0 % (ref 0.0–0.2)

## 2021-05-29 LAB — CBC
HCT: 25.7 % — ABNORMAL LOW (ref 36.0–46.0)
Hemoglobin: 8.6 g/dL — ABNORMAL LOW (ref 12.0–15.0)
MCH: 29.8 pg (ref 26.0–34.0)
MCHC: 33.5 g/dL (ref 30.0–36.0)
MCV: 88.9 fL (ref 80.0–100.0)
Platelets: 232 10*3/uL (ref 150–400)
RBC: 2.89 MIL/uL — ABNORMAL LOW (ref 3.87–5.11)
RDW: 18.6 % — ABNORMAL HIGH (ref 11.5–15.5)
WBC: 16.1 10*3/uL — ABNORMAL HIGH (ref 4.0–10.5)
nRBC: 0.2 % (ref 0.0–0.2)

## 2021-05-29 LAB — GLUCOSE, CAPILLARY
Glucose-Capillary: 141 mg/dL — ABNORMAL HIGH (ref 70–99)
Glucose-Capillary: 127 mg/dL — ABNORMAL HIGH (ref 70–99)
Glucose-Capillary: 118 mg/dL — ABNORMAL HIGH (ref 70–99)
Glucose-Capillary: 120 mg/dL — ABNORMAL HIGH (ref 70–99)

## 2021-05-29 LAB — RENAL FUNCTION PANEL
Albumin: 1.8 g/dL — ABNORMAL LOW (ref 3.5–5.0)
Anion gap: 12 (ref 5–15)
BUN: 17 mg/dL (ref 8–23)
CO2: 22 mmol/L (ref 22–32)
Calcium: 8.3 mg/dL — ABNORMAL LOW (ref 8.9–10.3)
Chloride: 103 mmol/L (ref 98–111)
Creatinine, Ser: 3.92 mg/dL — ABNORMAL HIGH (ref 0.44–1.00)
GFR, Estimated: 11 mL/min — ABNORMAL LOW (ref >60)
Glucose, Bld: 127 mg/dL — ABNORMAL HIGH (ref 70–99)
Phosphorus: 4.1 mg/dL (ref 2.5–4.6)
Potassium: 3.7 mmol/L (ref 3.5–5.1)
Sodium: 137 mmol/L (ref 135–145)

## 2021-05-29 MED ORDER — LIDOCAINE HCL (PF) 1 % IJ SOLN: 5.0000 mL | INTRAMUSCULAR | Status: DC | PRN

## 2021-05-29 MED ORDER — FENTANYL CITRATE (PF) 100 MCG/2ML IJ SOLN
50.0000 ug | INTRAMUSCULAR | Status: DC | PRN
  Administered 2021-05-30 (×4): 100 ug via INTRAVENOUS
  Filled 2021-05-29 (×4): qty 2

## 2021-05-29 MED ORDER — LIDOCAINE-PRILOCAINE 2.5-2.5 % EX CREA
1.0000 "application " | TOPICAL_CREAM | CUTANEOUS | Status: DC | PRN
  Filled 2021-05-29: qty 5

## 2021-05-29 MED ORDER — ALTEPLASE 2 MG IJ SOLR: 2.0000 mg | Freq: Once | INTRAMUSCULAR | Status: DC | PRN

## 2021-05-29 MED ORDER — PENTAFLUOROPROP-TETRAFLUOROETH EX AERO: 1.0000 "application " | INHALATION_SPRAY | CUTANEOUS | Status: DC | PRN

## 2021-05-29 MED ORDER — SODIUM CHLORIDE 0.9% FLUSH
10.0000 mL | Freq: Two times a day (BID) | INTRAVENOUS | Status: DC
  Administered 2021-05-29 (×2): 10 mL

## 2021-05-29 MED ORDER — FENTANYL CITRATE (PF) 100 MCG/2ML IJ SOLN
25.0000 ug | INTRAMUSCULAR | Status: DC | PRN
  Administered 2021-05-29 (×3): 50 ug via INTRAVENOUS
  Administered 2021-05-29: 25 ug via INTRAVENOUS
  Administered 2021-05-29 (×3): 50 ug via INTRAVENOUS
  Filled 2021-05-29 (×7): qty 2

## 2021-05-29 MED ORDER — CHLORHEXIDINE GLUCONATE CLOTH 2 % EX PADS: 6.0000 | MEDICATED_PAD | Freq: Every day | CUTANEOUS | Status: DC

## 2021-05-29 MED ORDER — HEPARIN SODIUM (PORCINE) 1000 UNIT/ML DIALYSIS: 1000.0000 [IU] | INTRAMUSCULAR | Status: DC | PRN

## 2021-05-29 MED ORDER — SODIUM CHLORIDE 0.9 % IV SOLN: 100.0000 mL | INTRAVENOUS | Status: DC | PRN

## 2021-05-29 MED ORDER — SODIUM CHLORIDE 0.9% FLUSH: 10.0000 mL | INTRAVENOUS | Status: DC | PRN

## 2021-05-29 NOTE — Progress Notes (Signed)
Spoke with [name] and Grayland Ormond (patient's sons) about their mother's condition and that she has reached end of life, requiring pressure support and not tolerating HD at this point. They are leaning towards a hospice facility but want to talk to their brother [name] before transitioning to comfort care. Also wanting patient's granddaughter to visit, she is currently in [employer] training but has been approved to leave. They think she will be able to come Thursday. Discussed our goals should be to keep the patient comfortable. Will follow up with family once they talk to [name].    Harlow Ohms, DO PGY-3 IMTS

## 2021-05-29 NOTE — Progress Notes (Signed)
eLink Physician-Brief Progress Note Patient Name: [name] DOB: Aug 13, 1945 MRN: [id number]   Date of Service  05/29/2021  HPI/Events of Note  Patient c/o generalized pain. Current Fentanyl 25-50 mcg Q 1 hour PRN not effective. Not able to take PO.   eICU Interventions  Plan" Increase Fentanyl to 50-100 mcg IV Q 1 hour PRN pain.      Intervention Category Major Interventions: Other:  Lysle Dingwall 05/29/2021, 11:57 PM

## 2021-05-29 NOTE — Progress Notes (Addendum)
[facility] NEPHROLOGY PROGRESS NOTE  Assessment/ Plan:   OP HD: TTS [facility]   4h  400/800   67kg  2/2.5 bath  Hep none  LIJ TDC  - mircera 200 q2 last 10/10  - calc 1.25 ug tiw po  # Shock: Presumed sepsis, getting IV cefepime. Possible colitis per CT. Blood cx's negative. WBC improving.  Continue to require Levophed IV and home midodrine ^'d to 15 mg tid here.  # ESRD: TTS HD.  Status post HD on 10/23 off schedule.  Noted patient is DNR/DNI and goals of care ongoing.  We will attempt dialysis today to see if patient can tolerate.  I think she will continue to require Levophed.  The dialysis has been challenging because of hypotension.  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 Received Mircera at OP HD on 10/10.  Start Aranesp weekly in the hospital.  # BP/ volume overload: chronically low BP's on midodrine at home.  Still looks volume up on exam.  Difficult ultrafiltration because of hypotension.  Currently on Levophed as above.  # Atrial feb: on amio and eliquis.  # Metabolic Bone Disease: Ca in range, phos is low. Cont vdra po tiw. No binder needed.  Repleted IV phosphorus for hypophosphatemia.  # FTT - sig decline this past year. Palliative care team consulting, appreciate assistance.  Currently DNR/DNI.  Goals of care discussion ongoing.  Addendum: 2:22 PM Discussed with ICU providers, palliative care team and the nurse. The pt expressed not wanting dialysis and focus on pain management. The nurse relayed the information to her son. Apparently, the son is coming to the hospital this evening. We'll hold HD until further clarification of GOC. Fortunately, there is no urgent need for HD today. We'll continue to monitor. D/w HD nurse.    Subjective: Seen and examined ICU.  Tried to wean down Levophed causing low blood pressure.  Patient is complaining of lower back pain.  Denies other complaint.  No new event.  Discussed with ICU nurse. Objective Vital signs in last 24  hours: Vitals:   05/29/21 0800 05/29/21 0802 05/29/21 0823 05/29/21 0900  BP: 95/61   (!) 110/51  Pulse: 90   (!) 104  Resp:      Temp:   98 F (36.7 C)   TempSrc:   Oral   SpO2: 100% 100%  100%  Weight:      Height:       Weight change: -1.4 kg  Intake/Output Summary (Last 24 hours) at 05/29/2021 0925 Last data filed at 05/29/2021 0900 Gross per 24 hour  Intake 509.72 ml  Output --  Net 509.72 ml        Labs: Basic Metabolic Panel: Recent Labs  Lab 05/27/21 0838 05/28/21 0439 05/29/21 0410  NA 133* 134* 137  K 3.8 4.0 3.7  CL 103 101 103  CO2 21* 23 22  GLUCOSE 140* 173* 127*  BUN 23 9 17   CREATININE 4.73* 2.88* 3.92*  CALCIUM 8.7* 8.3* 8.3*  PHOS 1.8* 1.9* 4.1    Liver Function Tests: Recent Labs  Lab 05/24/21 0639 05/25/21 0352 05/27/21 0838 05/28/21 0439 05/29/21 0410  AST 13*  --   --   --   --   ALT 11  --   --   --   --   ALKPHOS 87  --   --   --   --   BILITOT 1.0  --   --   --   --  PROT 5.2*  --   --   --   --   ALBUMIN 2.3*   < > 2.0* 2.1* 1.8*   < > = values in this interval not displayed.    No results for input(s): LIPASE, AMYLASE in the last 168 hours. No results for input(s): AMMONIA in the last 168 hours. CBC: Recent Labs  Lab 05/25/21 0352 05/26/21 0407 05/27/21 0838 05/28/21 0439 05/29/21 0410  WBC 15.7* 10.4 14.4* 13.8* 14.9*  NEUTROABS 13.9* 9.0* 12.5* 11.9* 12.7*  HGB 8.9* 8.3* 8.5* 9.0* 8.3*  HCT 27.8* 25.4* 26.2* 27.7* 25.3*  MCV 92.1 91.4 91.3 90.5 89.4  PLT 175 142* 196 188 201    Cardiac Enzymes: No results for input(s): CKTOTAL, CKMB, CKMBINDEX, TROPONINI in the last 168 hours. CBG: Recent Labs  Lab 05/27/21 1535 05/28/21 0724 05/28/21 1119 05/28/21 1525 05/29/21 0818  GLUCAP 126* 176* 128* 138* 141*     Iron Studies: No results for input(s): IRON, TIBC, TRANSFERRIN, FERRITIN in the last 72 hours. Studies/Results: No results found.  Medications: Infusions:  sodium chloride     sodium  chloride     sodium chloride 250 mL (05/25/2021 1710)   norepinephrine (LEVOPHED) Adult infusion 7 mcg/min (05/29/21 0900)   sodium chloride      Scheduled Medications:  allopurinol  100 mg Oral QHS   amiodarone  200 mg Oral q AM   apixaban  2.5 mg Oral BID   atorvastatin  40 mg Oral q1800   calcitRIOL  1.25 mcg Oral Q T,Th,Sa-HD   chlorhexidine  15 mL Mouth Rinse BID   Chlorhexidine Gluconate Cloth  6 each Topical Q0600   collagenase  1 application Topical Daily   feeding supplement  1 Container Oral TID BM   ferrous sulfate  325 mg Oral QHS   hydrocortisone  10 mg Oral BID   insulin aspart  0-9 Units Subcutaneous TID WC   mouth rinse  15 mL Mouth Rinse q12n4p   midodrine  20 mg Oral TID WC   mirtazapine  7.5 mg Oral QHS   mometasone-formoterol  2 puff Inhalation BID   multivitamin  1 tablet Oral QHS   pantoprazole  80 mg Oral BID AC   polyethylene glycol  17 g Oral BID   sodium chloride flush  10-40 mL Intracatheter Q12H    have reviewed scheduled and prn medications.  Physical Exam: General: critically ill looking female lying on bed, reports back pain Heart:RRR, s1s2 nl Lungs: Distant breath sound Abdomen:soft, Non-tender,  Extremities: Both upper and lower extremities edema present, not changed Dialysis Access: Left IJ TDC in place.  Deniz Hannan Prasad Lada Fulbright 05/29/2021,9:25 AM  LOS: 7 days

## 2021-05-29 NOTE — Progress Notes (Signed)
NAME:  [name], MRN:  [id number], DOB:  Oct 11, 1945, LOS: 7 ADMISSION DATE:  05/28/2021, CONSULTATION DATE:  05/27/2021 REFERRING MD:  Horris Latino, MD CHIEF COMPLAINT:  Hypotensive    History of Present Illness:  75 year old ESRD on hemodialysis, nursing home resident, brought in for generalized weakness, shortness of breath mental status. History obtained from son [name] at the bedside and reviewing chart. Last dialysis was 5 days ago , she is a difficult access and dialyzed via left permacath ED visit noted 10/3, chronic left upper extremity swelling, duplex was negative Son also reports sacral decubitus , she was noted to desaturate today and sent to the emergency room, requiring 2 L oxygen.  Initial blood pressure was 025 systolic but then dropped to the 80s.  On my arrival again 103/88 Labs showed WBC count of 18 K with left shift, she is noted to have chronic leukocytosis, stable anemia, BUN and creatinine have increased  Past Medical History:  ESRD on HD Diabetes type 2 Large hiatal hernia Hypertension OSA Atrial fibrillation COPD CAD, Echo 03/2021 normal LVEF grade 1 diastolic dysfunction  Significant Hospital Events:  10/18 hypotensive, started on Levophed peripheral , dialyzed 10/19 left femoral line placed and transferred to [facility] for CR[facility]  Consults:  Palliative Nephro  Procedures:  10/19 left femoral line  Micro Data:  10/19 blood cultures no growth to date  Antimicrobials:  10/18 cefepime and vancomycin for total of 7 days  Interim History / Subjective:  Overnight patient complained of pain all over, endorsed a choking sensation. This morning states she is having pain in her legs. When asked if she wants to continue HD she said she has to.   Objective   Blood pressure (!) 114/55, pulse 89, temperature 98.3 F (36.8 C), temperature source Oral, resp. rate 16, height 4\' 8"  (1.422 m), weight 62.8 kg, SpO2 100 %.        Intake/Output Summary (Last  24 hours) at 05/29/2021 0731 Last data filed at 05/29/2021 0700 Gross per 24 hour  Intake 505.62 ml  Output --  Net 505.62 ml   Filed Weights   05/27/21 1845 05/28/21 0600 05/29/21 0500  Weight: 65 kg 69 kg 62.8 kg    Examination: General: chronically ill appearing, NAD, awake and alert HENT: /AT, moist membranes, PERRLA  Lungs: diminished breath sounds bilaterally, on supplemental oxygen Cardiovascular: RRR, no murmurs Abdomen: soft, non tender, bowel sounds present Extremities: upper extremity edema,  Neuro: alert and oriented, following commands GU: foley in place  WBC 14.9 Electrolytes wnl  Resolved Hospital Problem list     Assessment & Plan:  Ongoing shock, acute on chronic secondary to possible colitis  Procalcitonin was mildly elevated and pressures remain low/borderline.  CT abdomen was questionable for colitis however no other signs of infection.  Patient remains on midodrine and requiring pressor support. Trialing off levophed this morning to see how patient will tolerate.  - Wean levophed for SBP >85 - Continue midodrine 20 mg TID - Renally dose Cefepime for total 7-10 days   Hypoxemia Related to poor mobility and atelectasis. - Wean supplemental oxygen for goal SpO2 >88% - Pulmonary hygiene   Afib - Amio 200 mg daily - Eliquis   End-stage renal failure on HD with difficult IV access Nephrology following. TTS schedule. Last HD 10/23, plan for HD again today. Has been unable to tolerate in a seated position, required dialysis in a stretcher.  Unfortunately will not tolerate outpatient HD at this point.  Ongoing  discussions with palliative care  Severe muscular deconditioning- essentially bedbound, sacral pressure injury POA   Progressive FTT Frail elderly - Palliative following    Best practice (evaluated daily)  Diet/type: dysphagia diet (see orders) DVT prophylaxis: DOAC GI prophylaxis: N/A Lines: Central line and Dialysis Catheter Foley:   N/A Code Status:  DNR Last date of multidisciplinary goals of care discussion: DNR, on going discussions of [place]   Labs   CBC: Recent Labs  Lab 05/25/21 0352 05/26/21 0407 05/27/21 0838 05/28/21 0439 05/29/21 0410  WBC 15.7* 10.4 14.4* 13.8* 14.9*  NEUTROABS 13.9* 9.0* 12.5* 11.9* 12.7*  HGB 8.9* 8.3* 8.5* 9.0* 8.3*  HCT 27.8* 25.4* 26.2* 27.7* 25.3*  MCV 92.1 91.4 91.3 90.5 89.4  PLT 175 142* 196 188 644    Basic Metabolic Panel: Recent Labs  Lab 05/25/21 0352 05/26/21 0500 05/27/21 0838 05/28/21 0439 05/29/21 0410  NA 130* 135 133* 134* 137  K 3.7 3.1* 3.8 4.0 3.7  CL 98 109 103 101 103  CO2 24 20* 21* 23 22  GLUCOSE 119* 109* 140* 173* 127*  BUN 9 15 23 9 17   CREATININE 2.75* 3.50* 4.73* 2.88* 3.92*  CALCIUM 8.6* 7.2* 8.7* 8.3* 8.3*  PHOS 1.4* 1.5* 1.8* 1.9* 4.1   GFR: Estimated Creatinine Clearance: 9 mL/min (A) (by C-G formula based on SCr of 3.92 mg/dL (H)). Recent Labs  Lab 06/01/2021 1656 05/27/2021 1713 05/23/21 0035 05/24/21 0347 05/25/21 0352 05/26/21 0407 05/27/21 0838 05/28/21 0439 05/29/21 0410  PROCALCITON  --  1.31  --  1.19  --   --   --   --   --   WBC  --   --    < > 16.4*   < > 10.4 14.4* 13.8* 14.9*  LATICACIDVEN 0.5  --   --   --   --   --   --   --   --    < > = values in this interval not displayed.    Liver Function Tests: Recent Labs  Lab 05/24/21 4259 05/25/21 0352 05/26/21 0500 05/27/21 0838 05/28/21 0439 05/29/21 0410  AST 13*  --   --   --   --   --   ALT 11  --   --   --   --   --   ALKPHOS 87  --   --   --   --   --   BILITOT 1.0  --   --   --   --   --   PROT 5.2*  --   --   --   --   --   ALBUMIN 2.3* 2.3* 1.6* 2.0* 2.1* 1.8*   No results for input(s): LIPASE, AMYLASE in the last 168 hours. No results for input(s): AMMONIA in the last 168 hours.  ABG    Component Value Date/Time   PHART 7.398 05/21/2021 1725   PCO2ART 40.6 05/07/2021 1725   PO2ART 97.8 05/17/2021 1725   HCO3 24.6 05/21/2021 1725   TCO2  26 04/13/2021 1733   ACIDBASEDEF 3.8 (H) 01/30/2007 1830   O2SAT 97.3 05/27/2021 1725     Coagulation Profile: No results for input(s): INR, PROTIME in the last 168 hours.  Cardiac Enzymes: No results for input(s): CKTOTAL, CKMB, CKMBINDEX, TROPONINI in the last 168 hours.  HbA1C: Hgb A1c MFr Bld  Date/Time Value Ref Range Status  05/24/2021 03:40 PM 4.6 (L) 4.8 - 5.6 % Final    Comment:    (NOTE)  Prediabetes: 5.7 - 6.4         Diabetes: >6.4         Glycemic control for adults with diabetes: <7.0   04/24/2020 06:44 PM 6.0 (H) 4.8 - 5.6 % Final    Comment:    (NOTE) Pre diabetes:          5.7%-6.4%  Diabetes:              >6.4%  Glycemic control for   <7.0% adults with diabetes     CBG: Recent Labs  Lab 05/27/21 1117 05/27/21 1535 05/28/21 0724 05/28/21 1119 05/28/21 1525  GLUCAP 141* 126* 176* 128* 138*    Past Medical History:  She,  has a past medical history of Anemia, Blood transfusion without reported diagnosis, CAD (coronary artery disease), Cataract, Cervical cancer ([place]) (1978), CHF (congestive heart failure) ([place]), Chronic back pain, Chronic kidney disease, Class 2 obesity with body mass index (BMI) of 35 to 39.9 without comorbidity, Complication of anesthesia, COPD (chronic obstructive pulmonary disease) ([place]), Degenerative disc disease, DM (diabetes mellitus), type 2 with renal complications ([place]), Dyslipidemia, Dyspnea, Dysrhythmia, ESRD (end stage renal disease) on dialysis ([place]), GERD (gastroesophageal reflux disease), Gout, Hiatal hernia (07/27/2013), History of diverticulitis of colon, History of hiatal hernia, HTN (hypertension), Hyperlipidemia, Hypotension, Hypothyroidism, unspecified (04/23/2021), IBS (irritable bowel syndrome), Iron deficiency anemia, Irritable bowel syndrome, Lumbar radiculopathy, MDD (major depressive disorder), Mixed hyperlipidemia, Moderate major depression, single episode ([place]) (07/21/2019), Neuropathy, OSA (obstructive  sleep apnea), Osteoporosis, Ovarian cancer ([place]) (1978), Oxygen deficiency, PAF (paroxysmal atrial fibrillation) ([place]), Pneumonia, Sleep apnea, Type 2 diabetes mellitus ([place]), Vitamin B deficiency (12/25/2009), and Vitamin B12 deficiency.   Surgical History:   Past Surgical History:  Procedure Laterality Date   ABDOMINAL HYSTERECTOMY     APPLICATION OF WOUND VAC Right 01/15/2021   Procedure: APPLICATION OF WOUND VAC;  Surgeon: Cherre Robins, MD;  Location: [facility];  Service: Vascular;  Laterality: Right;   AV FISTULA PLACEMENT Left 08/05/2019   Procedure: ARTERIOVENOUS (AV) FISTULA CREATION LEFT ARM;  Surgeon: Waynetta Sandy, MD;  Location: [facility];  Service: Vascular;  Laterality: Left;   AV FISTULA PLACEMENT Left 09/11/2020   Procedure: INSERTION OF ARTERIOVENOUS (AV) GORE-TEX GRAFT ARM LEFT;  Surgeon: Cherre Robins, MD;  Location: [place];  Service: Vascular;  Laterality: Left;   AV FISTULA PLACEMENT Right 11/29/2020   Procedure: Creation of Arteriovenous Fistula Right Upper Arm;  Surgeon: Rosetta Posner, MD;  Location: [facility] [facility];  Service: Vascular;  Laterality: Right;   [place] Left 10/05/2019   Procedure: SECOND STAGE LEFT BASCILIC VEIN TRANSPOSITION;  Surgeon: Waynetta Sandy, MD;  Location: [facility];  Service: Vascular;  Laterality: Left;   [place] Right 01/15/2021   Procedure: RIGHT UPPER ARM SECOND STAGE [place];  Surgeon: Cherre Robins, MD;  Location: [place] [facility];  Service: Vascular;  Laterality: Right;  PERIPHERAL NERVE BLOCK   Benign breast cysts     BIOPSY  02/26/2021   Procedure: BIOPSY;  Surgeon: Jackquline Denmark, MD;  Location: [facility] ENDOSCOPY;  Service: Endoscopy;;   CHOLECYSTECTOMY     COLONOSCOPY  10/01/2006   SLF:Pan colonic diverticulosis and moderate internal hemorrhoids/ Otherwise no polyps, masses, inflammatory changes or AVMs/   COLONOSCOPY  2011   SLF: pancolonic diverticulosis, large  internal hemorrhoids   COLONOSCOPY N/A 01/26/2016   Procedure: COLONOSCOPY;  Surgeon: [name], MD;  [facility];  Service: Endoscopy;  Laterality: N/A;  830    COLONOSCOPY WITH PROPOFOL N/A 02/10/2020   Procedure: COLONOSCOPY WITH PROPOFOL;  Surgeon: Daneil Dolin, MD;  [facility];  Service: Endoscopy;  Laterality: N/A;  10:45am   ESOPHAGEAL DILATION  02/26/2021   Procedure: ESOPHAGEAL DILATION;  Surgeon: Jackquline Denmark, MD;  Location: [facility] ENDOSCOPY;  Service: Endoscopy;;   ESOPHAGOGASTRODUODENOSCOPY  11/19/2006   SLF: Large hiatal hernia without evidence of [place] ulcers/. Distal esophageal stricture, which allowed the gastroscope to pass without resistance.  A 16 mm [place] later passed with mild resistance/ Normal stomach.sb bx negative   ESOPHAGOGASTRODUODENOSCOPY  10/01/2006   MWU:XLKGM hiatal hernia.  Distal esophagus without evidence of   erythema, ulceration or Barrett's esophagus   ESOPHAGOGASTRODUODENOSCOPY  2011   SLF: large hh, distal esophageal web narrowing to 61mm s/p dilation to 33mm   ESOPHAGOGASTRODUODENOSCOPY N/A 08/06/2013   SLF: 1. Stricture at the gastroesophageal junction 2. large hiatal hernia. 3. Mild erosive gastritis.   ESOPHAGOGASTRODUODENOSCOPY (EGD) WITH PROPOFOL N/A 07/19/2019   rourk: Mild erosive reflux esophagitis.  Mild Schatzki ring status post dilation.  Large hiatal hernia with at least one half of the stomach above the diaphragm.  Gastric mucosa erythematous.   ESOPHAGOGASTRODUODENOSCOPY (EGD) WITH PROPOFOL N/A 02/26/2021   Procedure: ESOPHAGOGASTRODUODENOSCOPY (EGD) WITH PROPOFOL;  Surgeon: Jackquline Denmark, MD;  Location: [facility] ENDOSCOPY;  Service: Endoscopy;  Laterality: N/A;   GIVENS CAPSULE STUDY N/A 08/06/2013   INCOMPLETE-SMALL BOWLE ULCERS   IR FLUORO GUIDE CV LINE LEFT  07/29/2019   IR US GUIDE VASC ACCESS LEFT  07/29/2019   KNEE SURGERY Right    PARTIAL HYSTERECTOMY  1978   PORT-A-CATH REMOVAL Right 11/29/2020   Procedure:  REMOVAL OF RIGHT CHEST PORT;  Surgeon: Rosetta Posner, MD;  Location: [facility];  Service: Vascular;  Laterality: Right;   small bowel capsule  2008   negative   SPINE SURGERY     TONSILLECTOMY AND ADENOIDECTOMY     Two back surgeries/fusion     UMBILICAL HERNIA REPAIR  2010   UPPER EXTREMITY VENOGRAPHY N/A 11/22/2020   Procedure: UPPER EXTREMITY VENOGRAPHY;  Surgeon: Cherre Robins, MD;  Location: [place] CV LAB;  Service: Cardiovascular;  Laterality: N/A;     Social History:   reports that she quit smoking about 59 years ago. Her smoking use included cigarettes. She started smoking about 60 years ago. She has a 1.00 pack-year smoking history. She has never used smokeless tobacco. She reports that she does not drink alcohol and does not use drugs.   Family History:  Her family history includes Asthma in her mother and sister; Cervical cancer in her mother; Colon cancer in her brother; Diabetes in her brother, mother, and sister; Early death in her father; Heart attack in her brother, maternal grandmother, mother, sister, and another family member; Heart disease in her brother and mother; Kidney failure in her sister; Stroke in her maternal grandmother, mother, and sister; Ulcers in her mother and sister.   Allergies No Known Allergies   Home Medications  Prior to Admission medications   Medication Sig Start Date End Date Taking? Authorizing Provider  acetaminophen (TYLENOL) 325 MG tablet Take 650 mg by mouth daily as needed for headache (pain).   Yes [provider]  allopurinol (ZYLOPRIM) 100 MG tablet Take 1 tablet (100 mg total) by mouth at bedtime. 03/18/21  Yes Aline August, MD  Amino Acids-Protein Hydrolys (FEEDING SUPPLEMENT, PRO-STAT 64,) LIQD Take 30 mLs by mouth 2 (two) times daily  with a meal.   Yes [provider]  amiodarone (PACERONE) 200 MG tablet Take 1 tablet (200 mg total) by mouth in the morning. 01/15/21  Yes Dagoberto Ligas, PA-C  apixaban (ELIQUIS)  2.5 MG TABS tablet Take 2.5 mg by mouth 2 (two) times daily.   Yes [provider]  atorvastatin (LIPITOR) 40 MG tablet Take 40 mg by mouth daily in the afternoon. 01/15/21  Yes Dagoberto Ligas, PA-C  Biotin 5000 MCG TABS Take 5,000 mcg by mouth in the morning.   Yes [provider]  budesonide-formoterol (SYMBICORT) 160-4.5 MCG/ACT inhaler Inhale 1 puff into the lungs daily.   Yes [provider]  Camphor-Menthol-Methyl Sal (SALONPAS) 3.08-10-08 % PTCH Place 1 patch onto the skin daily as needed (pain).   Yes [provider]  Carboxymethylcellul-Glycerin (CLEAR EYES FOR DRY EYES) 1-0.25 % SOLN Place 1 drop into both eyes daily as needed (dry eys).   Yes [provider]  Cholecalciferol (VITAMIN D3) 50 MCG (2000 UT) TABS Take 2,000 Units by mouth in the morning.   Yes [provider]  esomeprazole (NEXIUM) 40 MG capsule Take 1 capsule (40 mg total) by mouth 2 (two) times daily before a meal. 04/20/21  Yes Nita Sells, MD  ferric citrate (AURYXIA) 1 GM 210 MG(Fe) tablet Take 210 mg by mouth 3 (three) times daily with meals.   Yes [provider]  ferrous sulfate 325 (65 FE) MG EC tablet Take 325 mg by mouth at bedtime.   Yes [provider]  gabapentin (NEURONTIN) 100 MG capsule Take 100 mg by mouth at bedtime. 12/29/19  Yes [provider]  midodrine (PROAMATINE) 10 MG tablet Take 10 mg by mouth 3 (three) times daily.   Yes [provider]  mirtazapine (REMERON) 7.5 MG tablet Take 7.5 mg by mouth at bedtime.   Yes [provider]  Multiple Vitamin (MULTIVITAMIN WITH MINERALS) TABS tablet Take 1 tablet by mouth daily.   Yes [provider]  OXYGEN Inhale 2 L into the lungs See admin instructions. Use every night and as needed during the day   Yes [provider]  promethazine (PHENERGAN) 12.5 MG tablet Take 1 tablet (12.5 mg total) by mouth every 8 (eight) hours as needed for nausea  or vomiting. 03/08/21 05/23/21 Yes Carver, Elon Alas, DO  sennosides-docusate sodium (SENOKOT-S) 8.6-50 MG tablet Take 1 tablet by mouth at bedtime.   Yes [provider]  traMADol (ULTRAM) 50 MG tablet Take 1 tablet (50 mg total) by mouth every 12 (twelve) hours as needed for moderate pain. 05/08/21  Yes Barton Dubois, MD  vitamin C (ASCORBIC ACID) 500 MG tablet Take 500 mg by mouth daily.   Yes [provider]  zinc sulfate 220 (50 Zn) MG capsule Take 220 mg by mouth daily.   Yes [provider]  cefTRIAXone (ROCEPHIN) IVPB Inject 1 g into the vein daily. For 3 days Patient not taking: Reported on 05/23/2021    [provider]  iron sucrose in sodium chloride 0.9 % 100 mL Iron Sucrose (Venofer) 03/22/21 03/21/22  [provider]  Methoxy PEG-Epoetin Beta (MIRCERA IJ) Mircera 05/01/21 04/30/22  [provider]  nitroGLYCERIN (NITROSTAT) 0.4 MG SL tablet Place 0.4 mg under the tongue every 5 (five) minutes x 3 doses as needed for chest pain. 12/07/19   [provider]  Tuberculin PPD (TUBERSOL ID) Inject into the skin. 04/24/21   [provider]     Harlow Ohms, DO PGY-3  [facility]

## 2021-05-29 NOTE — Progress Notes (Addendum)
Palliative Medicine Inpatient Follow Up Note  Consulting Provider: Kipp Brood, MD   Reason for consult:   [name] Palliative Medicine Consult  Reason for Consult? progressive decline in elderly HD patient. son may have difficulty accepting    HPI:  Per intake H&P --> 75 year old woman who is critically ill due to vasoplegic shock due to presumed sepsis although no clear source found.  She has a more complicated history of longstanding ESRD on hemodialysis with recent development of hypotension requiring initiation of midodrine.  She has had generalized weakness and is now nursing home bound.   Palliative care has been asked to get involved to discuss goals of care in the setting of a progressively declining functional and overall poor health  Today's Discussion (05/29/2021):  *Please note that this is a verbal dictation therefore any spelling or grammatical errors are due to the "[place]" system interpretation.  Chart reviewed.  I met with [name], this afternoon she appears very uncomfortable per RN, [name] she received fentanyl IV twice. She is crying out in pain while I am present at bedside. She has shared with nursing that she does not want HD. I shared with [name] my concerns that she is getting to a point where I question if HD is helping her. I shared that if intolerant to it she will have a short journey here on earth which she to some degree seems aware of.  [name] expressed that [name] has appeared uncomfortable throughout the day. She has had to increase her pressor support in the setting of providing relief of pain and   I called patients son, [name] after leaving the room. We reviewed that [name] appears to be doing very poorly today. We reviewed that we are getting to a point where we will need to decide if the current measures to support her are worth the suffering she is enduring. I shared that alternatively if she  continues down this path we may need to determine if we are going to make her comfortable. We reviewed what that would look like in the grand scheme of things.   [name] shares that he knows his mother has been struggling for quite sometime and he does not desire to see her in discomfort. He expressed that he plans to come in this evening at [facility] to speak to her and help guide the next decision in her healthcare.  Objective Assessment: Vital Signs Vitals:   05/29/21 1315 05/29/21 1330  BP: (!) 126/50   Pulse: 90 (!) 114  Resp:    Temp:    SpO2: 99% 97%    Intake/Output Summary (Last 24 hours) at 05/29/2021 1437 Last data filed at 05/29/2021 1000 Gross per 24 hour  Intake 516.29 ml  Output --  Net 516.29 ml    Last Weight  Most recent update: 05/29/2021  5:44 AM    Weight  62.8 kg (138 lb 7.2 oz)            Gen: Frail elderly female in distress HEENT: Dry mucous membranes CV: Regular rate and irregular rhythm  PULM:  2LPM [place] ABD: soft/nontender  EXT: Edema noted in bilateral upper extremities, (+) weeping Neuro: Alert and oriented x2-3, very soft spoken   SUMMARY OF RECOMMENDATIONS   DNAR/DNI   Plan for patients son, [name] to come in this evening  Additional decision will be made on next course of care - continue with present measures inclusive of HD or transition to  a comfort emphasis  I will not be present this evening though I have alerted Dr. Laural Golden and Dr. Tamala Julian to the above conversation(s) so that they may continue discussions  Ongoing PMT support  Time Spent: 35 Greater than 50% of the time was spent in counseling and coordination of care ______________________________________________________________________________________ Addendum:  Received a call from patients granddaughter this afternoon. Requested the help of the PMT to have a family member removed from armed force training to come see [name]. I was able to speak to the [employer] over the phone and  verify that patient is critically ill so that an exception could be make for her to visit.  This took some time and coordination though I believe the command center got the message through with success.  Time In: 1412 Time Out: 1512 Total Time: 60 additional minutes Greater than 50%  of this time was spent counseling and coordinating care related to the above assessment and plan. [place] Team Team Cell Phone: [phone] Please utilize secure chat with additional questions, if there is no response within 30 minutes please call the above phone number  Palliative Medicine Team providers are available by phone from 7am to 7pm daily and can be reached through the team cell phone.  Should this patient require assistance outside of these hours, please call the patient's attending physician.

## 2021-05-29 NOTE — Progress Notes (Signed)
Patient hemodialysis on hold for today per Dr. Carolin Sicks.

## 2021-05-30 DIAGNOSIS — Z7189 Other specified counseling: Secondary | ICD-10-CM | POA: Diagnosis not present

## 2021-05-30 DIAGNOSIS — Z515 Encounter for palliative care: Secondary | ICD-10-CM | POA: Diagnosis not present

## 2021-05-30 DIAGNOSIS — E1142 Type 2 diabetes mellitus with diabetic polyneuropathy: Secondary | ICD-10-CM

## 2021-05-30 DIAGNOSIS — J9611 Chronic respiratory failure with hypoxia: Secondary | ICD-10-CM | POA: Diagnosis not present

## 2021-05-30 DIAGNOSIS — R0602 Shortness of breath: Secondary | ICD-10-CM | POA: Diagnosis not present

## 2021-05-30 LAB — RENAL FUNCTION PANEL
Albumin: 1.9 g/dL — ABNORMAL LOW (ref 3.5–5.0)
Anion gap: 13 (ref 5–15)
BUN: 28 mg/dL — ABNORMAL HIGH (ref 8–23)
CO2: 20 mmol/L — ABNORMAL LOW (ref 22–32)
Calcium: 8.5 mg/dL — ABNORMAL LOW (ref 8.9–10.3)
Chloride: 103 mmol/L (ref 98–111)
Creatinine, Ser: 4.95 mg/dL — ABNORMAL HIGH (ref 0.44–1.00)
GFR, Estimated: 9 mL/min — ABNORMAL LOW (ref >60)
Glucose, Bld: 157 mg/dL — ABNORMAL HIGH (ref 70–99)
Phosphorus: 4.6 mg/dL (ref 2.5–4.6)
Potassium: 4.1 mmol/L (ref 3.5–5.1)
Sodium: 136 mmol/L (ref 135–145)

## 2021-05-30 LAB — CBC WITH DIFFERENTIAL/PLATELET
Abs Immature Granulocytes: 0.21 10*3/uL — ABNORMAL HIGH (ref 0.00–0.07)
Basophils Absolute: 0 10*3/uL (ref 0.0–0.1)
Basophils Relative: 0 %
Eosinophils Absolute: 0 10*3/uL (ref 0.0–0.5)
Eosinophils Relative: 0 %
HCT: 27.3 % — ABNORMAL LOW (ref 36.0–46.0)
Hemoglobin: 8.8 g/dL — ABNORMAL LOW (ref 12.0–15.0)
Immature Granulocytes: 1 %
Lymphocytes Relative: 5 %
Lymphs Abs: 0.8 10*3/uL (ref 0.7–4.0)
MCH: 29.2 pg (ref 26.0–34.0)
MCHC: 32.2 g/dL (ref 30.0–36.0)
MCV: 90.7 fL (ref 80.0–100.0)
Monocytes Absolute: 1.2 10*3/uL — ABNORMAL HIGH (ref 0.1–1.0)
Monocytes Relative: 8 %
Neutro Abs: 13.2 10*3/uL — ABNORMAL HIGH (ref 1.7–7.7)
Neutrophils Relative %: 86 %
Platelets: 259 10*3/uL (ref 150–400)
RBC: 3.01 MIL/uL — ABNORMAL LOW (ref 3.87–5.11)
RDW: 18.6 % — ABNORMAL HIGH (ref 11.5–15.5)
WBC: 15.4 10*3/uL — ABNORMAL HIGH (ref 4.0–10.5)
nRBC: 0.1 % (ref 0.0–0.2)

## 2021-05-30 LAB — GLUCOSE, CAPILLARY: Glucose-Capillary: 175 mg/dL — ABNORMAL HIGH (ref 70–99)

## 2021-05-30 MED ORDER — MORPHINE BOLUS VIA INFUSION
5.0000 mg | INTRAVENOUS | Status: DC | PRN
  Administered 2021-05-30 (×2): 5 mg via INTRAVENOUS
  Filled 2021-05-30: qty 5

## 2021-05-30 MED ORDER — MORPHINE 100MG IN NS 100ML (1MG/ML) PREMIX INFUSION
0.0000 mg/h | INTRAVENOUS | Status: DC
  Administered 2021-05-30: 5 mg/h via INTRAVENOUS
  Filled 2021-05-30: qty 100

## 2021-05-30 MED ORDER — ACETAMINOPHEN 650 MG RE SUPP: 650.0000 mg | Freq: Four times a day (QID) | RECTAL | Status: DC | PRN

## 2021-05-30 MED ORDER — MORPHINE SULFATE (PF) 2 MG/ML IV SOLN: 2.0000 mg | INTRAVENOUS | Status: DC | PRN

## 2021-05-30 MED ORDER — DIPHENHYDRAMINE HCL 50 MG/ML IJ SOLN: 25.0000 mg | INTRAMUSCULAR | Status: DC | PRN

## 2021-05-30 MED ORDER — LORAZEPAM 2 MG/ML IJ SOLN: 0.5000 mg | INTRAMUSCULAR | Status: DC | PRN

## 2021-05-30 MED ORDER — MORPHINE 100MG IN NS 100ML (1MG/ML) PREMIX INFUSION: 1.0000 mg/h | INTRAVENOUS | Status: DC

## 2021-05-30 MED ORDER — LORAZEPAM 2 MG/ML IJ SOLN
2.0000 mg | INTRAMUSCULAR | Status: DC | PRN
  Administered 2021-05-30: 2 mg via INTRAVENOUS
  Filled 2021-05-30: qty 1

## 2021-05-30 MED ORDER — DEXTROSE 5 % IV SOLN: INTRAVENOUS | Status: DC

## 2021-05-30 MED ORDER — POLYVINYL ALCOHOL 1.4 % OP SOLN
1.0000 [drp] | Freq: Four times a day (QID) | OPHTHALMIC | Status: DC | PRN
  Administered 2021-05-30 (×2): 1 [drp] via OPHTHALMIC
  Filled 2021-05-30: qty 15

## 2021-05-30 MED ORDER — ACETAMINOPHEN 325 MG PO TABS: 650.0000 mg | ORAL_TABLET | Freq: Four times a day (QID) | ORAL | Status: DC | PRN

## 2021-05-30 MED ORDER — GLYCOPYRROLATE 0.2 MG/ML IJ SOLN: 0.2000 mg | INTRAMUSCULAR | Status: DC | PRN

## 2021-05-30 MED ORDER — LORAZEPAM 2 MG/ML IJ SOLN: 2.0000 mg | INTRAMUSCULAR | Status: DC | PRN

## 2021-05-30 MED ORDER — GLYCOPYRROLATE 1 MG PO TABS: 1.0000 mg | ORAL_TABLET | ORAL | Status: DC | PRN

## 2021-06-05 NOTE — Progress Notes (Addendum)
Palliative Medicine Inpatient Follow Up Note  Consulting Provider: Lynnell Catalan, MD   Reason for consult:   Palliative Care Consult Services Palliative Medicine Consult  Reason for Consult? progressive decline in elderly HD patient. son may have difficulty accepting    HPI:  Per intake H&P --> 75 year old woman who is critically ill due to vasoplegic shock due to presumed sepsis although no clear source found.  She has a more complicated history of longstanding ESRD on hemodialysis with recent development of hypotension requiring initiation of midodrine.  She has had generalized weakness and is now nursing home bound.   Palliative care has been asked to get involved to discuss goals of care in the setting of a progressively declining functional and overall poor health  Today's Discussion (06-06-2021):  *Please note that this is a verbal dictation therefore any spelling or grammatical errors are due to the "Dragon Medical One" system interpretation.  Chart reviewed. Per discussion with patients bedside RN, [name] has been shifted to comfort focused care by the CCS team.  [name] and I met with [name] (son), and [name] (son) this morning. [name] appears generally uncomfortable as per facial grimacing and moaning. She was bolused with additional morphine and provided ativan to aid in symptom relief. She appeared much more comfortable about ten minutes thereafter.   I spent time with patients son. We reviewed the discontent they had regarding the treatment of her hiatal hernia. We reviewed the worry, [name] had prior to admission and him wishing for answers to questions about her condition prior to admission.   Provided Palliative support through therapeutic listening. Offered my condolences in this difficult situation.   Additional family will be traveling in and out today. As per conversation with family our goals will be to optimize patient comfort and alleviate  suffering.  Questions and concerns answered.  Objective Assessment: Vital Signs Vitals:   06-06-2021 0845 06/06/21 0900  BP:    Pulse: 94 94  Resp: (!) 22 (!) 24  Temp:    SpO2: 91% 93%    Intake/Output Summary (Last 24 hours) at 06-06-21 1037 Last data filed at Jun 06, 2021 0900 Gross per 24 hour  Intake 152.22 ml  Output --  Net 152.22 ml    Last Weight  Most recent update: 05/29/2021  5:44 AM    Weight  62.8 kg (138 lb 7.2 oz)            Gen: Frail elderly female in distress HEENT: Dry mucous membranes CV: Regular rate and irregular rhythm  PULM:  On RA ABD: soft/nontender  EXT: Edema noted in bilateral upper extremities, (+) weeping Neuro: Somnolent, grimacing   SUMMARY OF RECOMMENDATIONS   DNAR/DNI   Comfort care  Comfort medications per [facility] -- Started on morphine by critical care team - need to keep a close eye for s/s of neurotoxicity if any indicators would switch to dilaudid in the setting of renal failure  Anticipate in hospital death  Ongoing PMT support  Time Spent: 35 Greater than 50% of the time was spent in counseling and coordination of care  [name] [facility] Palliative Medicine Team Team Cell Phone: [phone] Please utilize secure chat with additional questions, if there is no response within 30 minutes please call the above phone number  Palliative Medicine Team providers are available by phone from 7am to 7pm daily and can be reached through the team cell phone.  Should this patient require assistance outside of these hours, please  call the patient's attending physician.

## 2021-06-05 NOTE — Progress Notes (Addendum)
NAME:  [name], MRN:  [id number], DOB:  17-Jul-1946, LOS: 8 ADMISSION DATE:  05/17/2021, CONSULTATION DATE:  05/27/2021 REFERRING MD:  Horris Latino, MD CHIEF COMPLAINT:  Hypotensive    History of Present Illness:  75 year old ESRD on hemodialysis, nursing home resident, brought in for generalized weakness, shortness of breath mental status. History obtained from son [name] at the bedside and reviewing chart. Last dialysis was 5 days ago , she is a difficult access and dialyzed via left permacath ED visit noted 10/3, chronic left upper extremity swelling, duplex was negative Son also reports sacral decubitus , she was noted to desaturate today and sent to the emergency room, requiring 2 L oxygen.  Initial blood pressure was 009 systolic but then dropped to the 80s.  On my arrival again 103/88 Labs showed WBC count of 18 K with left shift, she is noted to have chronic leukocytosis, stable anemia, BUN and creatinine have increased  Past Medical History:  ESRD on HD Diabetes type 2 Large hiatal hernia Hypertension OSA Atrial fibrillation COPD CAD, Echo 03/2021 normal LVEF grade 1 diastolic dysfunction  Significant Hospital Events:  10/18 hypotensive, started on Levophed peripheral , dialyzed 10/19 left femoral line placed and transferred to [facility] for CR[facility]  Consults:  Palliative Nephro  Procedures:  10/19 left femoral line  Micro Data:  10/19 blood cultures no growth to date  Antimicrobials:  10/18 cefepime and vancomycin for total of 7 days  Interim History / Subjective:  Overnight patient continues to complain of generalized pain.  Fentanyl increased.  She is having a lot pain this morning, refusing HD and stating "what is the point of any of this."   Objective   Blood pressure (!) 108/47, pulse 83, temperature 98.1 F (36.7 C), temperature source Oral, resp. rate 13, height 4\' 8"  (1.422 m), weight 62.8 kg, SpO2 98 %.        Intake/Output Summary (Last 24  hours) at Jun 22, 2021 2330 Last data filed at 06-22-2021 0700 Gross per 24 hour  Intake 199.04 ml  Output --  Net 199.04 ml   Filed Weights   05/27/21 1845 05/28/21 0600 05/29/21 0500  Weight: 65 kg 69 kg 62.8 kg    Examination: General: chronically ill appearing, NAD, awake and alert HENT: [place]/AT, moist membranes, PERRLA  Lungs: diminished breath sounds bilaterally, on supplemental oxygen Cardiovascular: RRR, no murmurs Abdomen: soft, non tender, bowel sounds present Extremities: upper extremity edema,  Neuro: alert and oriented, following commands GU: foley in place  WBC 15.4 Electrolytes wnl  Resolved Hospital Problem list     Assessment & Plan:  Ongoing shock, acute on chronic secondary to possible colitis  Procalcitonin was mildly elevated and pressures remain low/borderline.  CT abdomen was questionable for colitis however no other signs of infection.  Patient remains on midodrine and requiring pressor support.  Trialed off Levophed yesterday morning with systolic blood pressures dropping below 85.  Patient unable to tolerate HD at this point and has not been able to come off pressor support.  Patient expressing wanting to de-escalate care, spoke with son [name] this morning who agrees with transition to comfort care. - Transition to comfort care   Hypoxemia Related to poor mobility and atelectasis.   Afib -Stop amio and eliquis, comfort care   End-stage renal failure on HD with difficult IV access Nephrology following. TTS schedule. Last HD 10/23, patient refused HD yesterday due to significant pain.  Has been unable to tolerate in a seated position, required dialysis  in a stretcher.  Unfortunately will not tolerate outpatient HD at this point.  Transition to comfort today.  Severe muscular deconditioning- essentially bedbound, sacral pressure injury POA   Progressive FTT Frail elderly - Palliative following   Spoke with patient's son this [name] and Grayland Ormond yesterday  evening who wanted to discuss with their other brother [name] prior to transitioning patient to comfort care.  They are considering hospice facilities.  Also possibly want their granddaughter from [place] to visit prior to transition, she is in [employer] training however been approved to leave which may be Thursday.  Update- spoke with patient this morning at bedside, in agreement to transitioning patient to comfort care.  Best practice (evaluated daily)  Diet/type: dysphagia diet (see orders) DVT prophylaxis: DOAC GI prophylaxis: N/A Lines: Central line and Dialysis Catheter Foley:  N/A Code Status:  DNR Last date of multidisciplinary goals of care discussion: DNR, comfort care   Labs   CBC: Recent Labs  Lab 05/26/21 0407 05/27/21 0838 05/28/21 0439 05/29/21 0410 05/29/21 1318 06-11-2021 0521  WBC 10.4 14.4* 13.8* 14.9* 16.1* 15.4*  NEUTROABS 9.0* 12.5* 11.9* 12.7*  --  13.2*  HGB 8.3* 8.5* 9.0* 8.3* 8.6* 8.8*  HCT 25.4* 26.2* 27.7* 25.3* 25.7* 27.3*  MCV 91.4 91.3 90.5 89.4 88.9 90.7  PLT 142* 196 188 201 232 810    Basic Metabolic Panel: Recent Labs  Lab 05/26/21 0500 05/27/21 0838 05/28/21 0439 05/29/21 0410 06-11-2021 0521  NA 135 133* 134* 137 136  K 3.1* 3.8 4.0 3.7 4.1  CL 109 103 101 103 103  CO2 20* 21* 23 22 20*  GLUCOSE 109* 140* 173* 127* 157*  BUN 15 23 9 17  28*  CREATININE 3.50* 4.73* 2.88* 3.92* 4.95*  CALCIUM 7.2* 8.7* 8.3* 8.3* 8.5*  PHOS 1.5* 1.8* 1.9* 4.1 4.6   GFR: Estimated Creatinine Clearance: 7.2 mL/min (A) (by C-G formula based on SCr of 4.95 mg/dL (H)). Recent Labs  Lab 05/24/21 0639 05/25/21 0352 05/28/21 0439 05/29/21 0410 05/29/21 1318 June 11, 2021 0521  PROCALCITON 1.19  --   --   --   --   --   WBC 16.4*   < > 13.8* 14.9* 16.1* 15.4*   < > = values in this interval not displayed.    Liver Function Tests: Recent Labs  Lab 05/24/21 1751 05/25/21 0352 05/26/21 0500 05/27/21 0258 05/28/21 0439 05/29/21 0410 2021-06-11 0521   AST 13*  --   --   --   --   --   --   ALT 11  --   --   --   --   --   --   ALKPHOS 87  --   --   --   --   --   --   BILITOT 1.0  --   --   --   --   --   --   PROT 5.2*  --   --   --   --   --   --   ALBUMIN 2.3*   < > 1.6* 2.0* 2.1* 1.8* 1.9*   < > = values in this interval not displayed.   No results for input(s): LIPASE, AMYLASE in the last 168 hours. No results for input(s): AMMONIA in the last 168 hours.  ABG    Component Value Date/Time   PHART 7.398 05/19/2021 1725   PCO2ART 40.6 05/11/2021 1725   PO2ART 97.8 05/17/2021 1725   HCO3 24.6 05/13/2021 1725   TCO2 26 04/13/2021  1733   ACIDBASEDEF 3.8 (H) 01/30/2007 1830   O2SAT 97.3 05/14/2021 1725     Coagulation Profile: No results for input(s): INR, PROTIME in the last 168 hours.  Cardiac Enzymes: No results for input(s): CKTOTAL, CKMB, CKMBINDEX, TROPONINI in the last 168 hours.  HbA1C: Hgb A1c MFr Bld  Date/Time Value Ref Range Status  05/24/2021 03:40 PM 4.6 (L) 4.8 - 5.6 % Final    Comment:    (NOTE)         Prediabetes: 5.7 - 6.4         Diabetes: >6.4         Glycemic control for adults with diabetes: <7.0   04/24/2020 06:44 PM 6.0 (H) 4.8 - 5.6 % Final    Comment:    (NOTE) Pre diabetes:          5.7%-6.4%  Diabetes:              >6.4%  Glycemic control for   <7.0% adults with diabetes     CBG: Recent Labs  Lab 05/28/21 1525 05/29/21 0818 05/29/21 1129 05/29/21 1531 05/29/21 [place] 138* 141* 127* 118* 120*    Past Medical History:  She,  has a past medical history of Anemia, Blood transfusion without reported diagnosis, CAD (coronary artery disease), Cataract, Cervical cancer ([place]) (1978), CHF (congestive heart failure) ([place]), Chronic back pain, Chronic kidney disease, Class 2 obesity with body mass index (BMI) of 35 to 39.9 without comorbidity, Complication of anesthesia, COPD (chronic obstructive pulmonary disease) ([place]), Degenerative disc disease, DM (diabetes mellitus), type 2  with renal complications ([employer]), Dyslipidemia, Dyspnea, Dysrhythmia, ESRD (end stage renal disease) on dialysis ([place]), GERD (gastroesophageal reflux disease), Gout, Hiatal hernia (07/27/2013), History of diverticulitis of colon, History of hiatal hernia, HTN (hypertension), Hyperlipidemia, Hypotension, Hypothyroidism, unspecified (04/23/2021), IBS (irritable bowel syndrome), Iron deficiency anemia, Irritable bowel syndrome, Lumbar radiculopathy, MDD (major depressive disorder), Mixed hyperlipidemia, Moderate major depression, single episode ([place]) (07/21/2019), Neuropathy, OSA (obstructive sleep apnea), Osteoporosis, Ovarian cancer ([place]) (1978), Oxygen deficiency, PAF (paroxysmal atrial fibrillation) ([place]), Pneumonia, Sleep apnea, Type 2 diabetes mellitus ([place]), Vitamin B deficiency (12/25/2009), and Vitamin B12 deficiency.   Surgical History:   Past Surgical History:  Procedure Laterality Date   ABDOMINAL HYSTERECTOMY     APPLICATION OF WOUND VAC Right 01/15/2021   Procedure: APPLICATION OF WOUND VAC;  Surgeon: Cherre Robins, MD;  Location: [place];  Service: Vascular;  Laterality: Right;   AV FISTULA PLACEMENT Left 08/05/2019   Procedure: ARTERIOVENOUS (AV) FISTULA CREATION LEFT ARM;  Surgeon: Waynetta Sandy, MD;  Location: [place];  Service: Vascular;  Laterality: Left;   AV FISTULA PLACEMENT Left 09/11/2020   Procedure: INSERTION OF ARTERIOVENOUS (AV) GORE-TEX GRAFT ARM LEFT;  Surgeon: Cherre Robins, MD;  Location: [place];  Service: Vascular;  Laterality: Left;   AV FISTULA PLACEMENT Right 11/29/2020   Procedure: Creation of Arteriovenous Fistula Right Upper Arm;  Surgeon: Rosetta Posner, MD;  Location: [facility];  Service: Vascular;  Laterality: Right;   [place] Left 10/05/2019   Procedure: SECOND STAGE LEFT BASCILIC VEIN TRANSPOSITION;  Surgeon: Waynetta Sandy, MD;  Location: [facility];  Service: Vascular;  Laterality: Left;   [place] Right 01/15/2021   Procedure: RIGHT UPPER ARM SECOND STAGE [place];  Surgeon: Cherre Robins, MD;  Location: [facility];  Service: Vascular;  Laterality: Right;  PERIPHERAL NERVE BLOCK   Benign breast cysts  BIOPSY  02/26/2021   Procedure: BIOPSY;  Surgeon: Jackquline Denmark, MD;  Location: [facility] ENDOSCOPY;  Service: Endoscopy;;   CHOLECYSTECTOMY     COLONOSCOPY  10/01/2006   SLF:Pan colonic diverticulosis and moderate internal hemorrhoids/ Otherwise no polyps, masses, inflammatory changes or AVMs/   COLONOSCOPY  2011   SLF: pancolonic diverticulosis, large internal hemorrhoids   COLONOSCOPY N/A 01/26/2016   Procedure: COLONOSCOPY;  Surgeon: [name], MD;  [facility];  Service: Endoscopy;  Laterality: N/A;  830    COLONOSCOPY WITH PROPOFOL N/A 02/10/2020   Procedure: COLONOSCOPY WITH PROPOFOL;  Surgeon: Daneil Dolin, MD;  [facility];  Service: Endoscopy;  Laterality: N/A;  10:45am   ESOPHAGEAL DILATION  02/26/2021   Procedure: ESOPHAGEAL DILATION;  Surgeon: Jackquline Denmark, MD;  Location: [facility] ENDOSCOPY;  Service: Endoscopy;;   ESOPHAGOGASTRODUODENOSCOPY  11/19/2006   SLF: Large hiatal hernia without evidence of Cameron ulcers/. Distal esophageal stricture, which allowed the gastroscope to pass without resistance.  A 16 mm Savary later passed with mild resistance/ Normal stomach.sb bx negative   ESOPHAGOGASTRODUODENOSCOPY  10/01/2006   YDX:AJOIN hiatal hernia.  Distal esophagus without evidence of   erythema, ulceration or Barrett's esophagus   ESOPHAGOGASTRODUODENOSCOPY  2011   SLF: large hh, distal esophageal web narrowing to 75mm s/p dilation to 37mm   ESOPHAGOGASTRODUODENOSCOPY N/A 08/06/2013   SLF: 1. Stricture at the gastroesophageal junction 2. large hiatal hernia. 3. Mild erosive gastritis.   ESOPHAGOGASTRODUODENOSCOPY (EGD) WITH PROPOFOL N/A 07/19/2019   rourk: Mild erosive reflux esophagitis.  Mild Schatzki ring status post  dilation.  Large hiatal hernia with at least one half of the stomach above the diaphragm.  Gastric mucosa erythematous.   ESOPHAGOGASTRODUODENOSCOPY (EGD) WITH PROPOFOL N/A 02/26/2021   Procedure: ESOPHAGOGASTRODUODENOSCOPY (EGD) WITH PROPOFOL;  Surgeon: Jackquline Denmark, MD;  Location: [facility] ENDOSCOPY;  Service: Endoscopy;  Laterality: N/A;   GIVENS CAPSULE STUDY N/A 08/06/2013   INCOMPLETE-SMALL BOWLE ULCERS   IR FLUORO GUIDE CV LINE LEFT  07/29/2019   IR US GUIDE VASC ACCESS LEFT  07/29/2019   KNEE SURGERY Right    PARTIAL HYSTERECTOMY  1978   PORT-A-CATH REMOVAL Right 11/29/2020   Procedure: REMOVAL OF RIGHT CHEST PORT;  Surgeon: Rosetta Posner, MD;  Location: [place];  Service: Vascular;  Laterality: Right;   small bowel capsule  2008   negative   SPINE SURGERY     TONSILLECTOMY AND ADENOIDECTOMY     Two back surgeries/fusion     UMBILICAL HERNIA REPAIR  2010   UPPER EXTREMITY VENOGRAPHY N/A 11/22/2020   Procedure: UPPER EXTREMITY VENOGRAPHY;  Surgeon: Cherre Robins, MD;  Location: [facility] CV LAB;  Service: Cardiovascular;  Laterality: N/A;     Social History:   reports that she quit smoking about 59 years ago. Her smoking use included cigarettes. She started smoking about 60 years ago. She has a 1.00 pack-year smoking history. She has never used smokeless tobacco. She reports that she does not drink alcohol and does not use drugs.   Family History:  Her family history includes Asthma in her mother and sister; Cervical cancer in her mother; Colon cancer in her brother; Diabetes in her brother, mother, and sister; Early death in her father; Heart attack in her brother, maternal grandmother, mother, sister, and another family member; Heart disease in her brother and mother; Kidney failure in her sister; Stroke in her maternal grandmother, mother, and sister; Ulcers in her mother and sister.   Allergies No Known Allergies  Home Medications  Prior to Admission medications   Medication Sig  Start Date End Date Taking? Authorizing Provider  acetaminophen (TYLENOL) 325 MG tablet Take 650 mg by mouth daily as needed for headache (pain).   Yes [provider]  allopurinol (ZYLOPRIM) 100 MG tablet Take 1 tablet (100 mg total) by mouth at bedtime. 03/18/21  Yes Aline August, MD  Amino Acids-Protein Hydrolys (FEEDING SUPPLEMENT, PRO-STAT 64,) LIQD Take 30 mLs by mouth 2 (two) times daily with a meal.   Yes [provider]  amiodarone (PACERONE) 200 MG tablet Take 1 tablet (200 mg total) by mouth in the morning. 01/15/21  Yes Dagoberto Ligas, PA-C  apixaban (ELIQUIS) 2.5 MG TABS tablet Take 2.5 mg by mouth 2 (two) times daily.   Yes [provider]  atorvastatin (LIPITOR) 40 MG tablet Take 40 mg by mouth daily in the afternoon. 01/15/21  Yes Dagoberto Ligas, PA-C  Biotin 5000 MCG TABS Take 5,000 mcg by mouth in the morning.   Yes [provider]  budesonide-formoterol (SYMBICORT) 160-4.5 MCG/ACT inhaler Inhale 1 puff into the lungs daily.   Yes [provider]  Camphor-Menthol-Methyl Sal (SALONPAS) 3.08-10-08 % PTCH Place 1 patch onto the skin daily as needed (pain).   Yes [provider]  Carboxymethylcellul-Glycerin (CLEAR EYES FOR DRY EYES) 1-0.25 % SOLN Place 1 drop into both eyes daily as needed (dry eys).   Yes [provider]  Cholecalciferol (VITAMIN D3) 50 MCG (2000 UT) TABS Take 2,000 Units by mouth in the morning.   Yes [provider]  esomeprazole (NEXIUM) 40 MG capsule Take 1 capsule (40 mg total) by mouth 2 (two) times daily before a meal. 04/20/21  Yes Nita Sells, MD  ferric citrate (AURYXIA) 1 GM 210 MG(Fe) tablet Take 210 mg by mouth 3 (three) times daily with meals.   Yes [provider]  ferrous sulfate 325 (65 FE) MG EC tablet Take 325 mg by mouth at bedtime.   Yes [provider]  gabapentin (NEURONTIN) 100 MG capsule Take 100 mg by mouth at bedtime. 12/29/19  Yes [provider]  midodrine (PROAMATINE) 10 MG tablet Take 10 mg by mouth 3 (three) times daily.   Yes [provider]  mirtazapine (REMERON) 7.5 MG tablet Take 7.5 mg by mouth at bedtime.   Yes [provider]  Multiple Vitamin (MULTIVITAMIN WITH MINERALS) TABS tablet Take 1 tablet by mouth daily.   Yes [provider]  OXYGEN Inhale 2 L into the lungs See admin instructions. Use every night and as needed during the day   Yes [provider]  promethazine (PHENERGAN) 12.5 MG tablet Take 1 tablet (12.5 mg total) by mouth every 8 (eight) hours as needed for nausea or vomiting. 03/08/21 05/23/21 Yes Carver, Elon Alas, DO  sennosides-docusate sodium (SENOKOT-S) 8.6-50 MG tablet Take 1 tablet by mouth at bedtime.   Yes [provider]  traMADol (ULTRAM) 50 MG tablet Take 1 tablet (50 mg total) by mouth every 12 (twelve) hours as needed for moderate pain. 05/08/21  Yes Barton Dubois, MD  vitamin C (ASCORBIC ACID) 500 MG tablet Take 500 mg by mouth daily.   Yes [provider]  zinc sulfate 220 (50 Zn) MG capsule Take 220 mg by mouth daily.   Yes [provider]  cefTRIAXone (ROCEPHIN) IVPB Inject 1 g into the vein daily. For 3 days Patient not taking: Reported on 05/23/2021    [provider]  iron sucrose in sodium  chloride 0.9 % 100 mL Iron Sucrose (Venofer) 03/22/21 03/21/22  [provider]  Methoxy PEG-Epoetin Beta (MIRCERA IJ) Mircera 05/01/21 04/30/22  [provider]  nitroGLYCERIN (NITROSTAT) 0.4 MG SL tablet Place 0.4 mg under the tongue every 5 (five) minutes x 3 doses as needed for chest pain. 12/07/19   [provider]  Tuberculin PPD (TUBERSOL ID) Inject into the skin. 04/24/21   [provider]     Sadira Standard Laural Golden, DO PGY-3 IMTS

## 2021-06-05 NOTE — Progress Notes (Signed)
Nutrition Brief Note ° °Chart reviewed. °Pt now transitioning to comfort care.  °No further nutrition interventions planned at this time.  °Please re-consult as needed. ° ° °Kate Likisha Alles, MS, RD, LDN °Inpatient Clinical Dietitian °Please see [facility] for contact information. ° ° °

## 2021-06-05 NOTE — Progress Notes (Signed)
[facility] NEPHROLOGY PROGRESS NOTE  Assessment/ Plan:   OP HD: TTS [facility]   4h  400/800   67kg  2/2.5 bath  Hep none  [facility] TDC  - mircera 200 q2 last 10/10  - calc 1.25 ug tiw po  # Shock: Presumed sepsis, treated with antibiotics, pressors without much improvement.  Now comfort measures.   # ESRD: TTS HD.  Continue to require pressors and did not tolerate dialysis.  Seen by palliative care and she is now DNR/DNI and comfort measures.  No further dialysis.  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 Received Mircera at OP HD on 10/10.  Started Aranesp weekly in the hospital.  # BP/ volume overload: chronically low BP's on midodrine at home.  Continue to require pressure support.  # Atrial feb: on amio and eliquis.  # Metabolic Bone Disease: Ca in range, phos is low. Cont vdra po tiw. No binder needed.  Repleted IV phosphorus for hypophosphatemia.  # FTT - sig decline this past year.  She is now DNR/DNI and comfort measures.  No further dialysis.  We will sign off, please call back with question. Discussed with primary team.  Subjective: Seen and examined ICU.  Continues to require Levophed.  Complaining of back pain.  Slow to respond.  After discussion with the family member, palliative care and the team, the patient is now comfort measures. Objective Vital signs in last 24 hours: Vitals:   06/04/21 0815 06-04-2021 0830 Jun 04, 2021 0845 06/04/2021 0900  BP: (!) 106/48 (!) 114/51    Pulse: 86 (!) 102 94 94  Resp: 15 13 (!) 22 (!) 24  Temp:      TempSrc:      SpO2: 97% 97% 91% 93%  Weight:      Height:       Weight change:   Intake/Output Summary (Last 24 hours) at 06-04-21 1012 Last data filed at Jun 04, 2021 0900 Gross per 24 hour  Intake 152.22 ml  Output --  Net 152.22 ml        Labs: Basic Metabolic Panel: Recent Labs  Lab 05/28/21 0439 05/29/21 0410 2021/06/04 0521  NA 134* 137 136  K 4.0 3.7 4.1  CL 101 103 103  CO2 23 22 20*  GLUCOSE 173* 127*  157*  BUN 9 17 28*  CREATININE 2.88* 3.92* 4.95*  CALCIUM 8.3* 8.3* 8.5*  PHOS 1.9* 4.1 4.6    Liver Function Tests: Recent Labs  Lab 05/24/21 0639 05/25/21 0352 05/28/21 0439 05/29/21 0410 06-04-2021 0521  AST 13*  --   --   --   --   ALT 11  --   --   --   --   ALKPHOS 87  --   --   --   --   BILITOT 1.0  --   --   --   --   PROT 5.2*  --   --   --   --   ALBUMIN 2.3*   < > 2.1* 1.8* 1.9*   < > = values in this interval not displayed.    No results for input(s): LIPASE, AMYLASE in the last 168 hours. No results for input(s): AMMONIA in the last 168 hours. CBC: Recent Labs  Lab 05/27/21 0838 05/28/21 0439 05/29/21 0410 05/29/21 1318 June 04, 2021 0521  WBC 14.4* 13.8* 14.9* 16.1* 15.4*  NEUTROABS 12.5* 11.9* 12.7*  --  13.2*  HGB 8.5* 9.0* 8.3* 8.6* 8.8*  HCT 26.2* 27.7* 25.3* 25.7* 27.3*  MCV  91.3 90.5 89.4 88.9 90.7  PLT 196 188 201 232 259    Cardiac Enzymes: No results for input(s): CKTOTAL, CKMB, CKMBINDEX, TROPONINI in the last 168 hours. CBG: Recent Labs  Lab 05/29/21 0818 05/29/21 1129 05/29/21 1531 05/29/21 1926 2021-06-22 0736  GLUCAP 141* 127* 118* 120* 175*     Iron Studies: No results for input(s): IRON, TIBC, TRANSFERRIN, FERRITIN in the last 72 hours. Studies/Results: No results found.  Medications: Infusions:  sodium chloride 250 mL (05/07/2021 1710)   morphine 5 mg/hr (Jun 22, 2021 0900)   sodium chloride      Scheduled Medications:  chlorhexidine  15 mL Mouth Rinse BID   collagenase  1 application Topical Daily   mouth rinse  15 mL Mouth Rinse q12n4p   mometasone-formoterol  2 puff Inhalation BID   sodium chloride flush  10-40 mL Intracatheter Q12H    have reviewed scheduled and prn medications.  Physical Exam: General: critically ill looking female lying on bed, reports back pain Heart:RRR, s1s2 nl Lungs: Distant breath sound Abdomen:soft, Non-tender,  Extremities: Both upper and lower extremities edema present, not  changed Dialysis Access: Left IJ TDC in place.  Molli Gethers Prasad Verlisa Vara 06/22/2021,10:12 AM  LOS: 8 days

## 2021-06-05 NOTE — Death Summary Note (Signed)
DEATH SUMMARY   Patient Details  Name: [name] MRN: [id number] DOB: [birth year]  Admission/Discharge Information   Admit Date:  2021/06/17  Date of Death: Date of Death: 2021-06-25  Time of Death: Time of Death: Nov 20, 2113  Length of Stay: 11/07/22  Referring Physician: [name], MD   Reason(s) for Hospitalization  End stage renal disease Worsening chronic shock Failure to thrive DNR Sacral decubitus ulcer POA Protein calorie malnutrition DM2 GERD Anemia CHF Bedbound CAD HTN OSA HLD IBS MDD Anxiety  Brief Hospital Course (including significant findings, care, treatment, and services provided and events leading to death)  75 year old ESRD on hemodialysis, nursing home resident, brought in for generalized weakness, shortness of breath mental status. History obtained from son [name] at the bedside and reviewing chart. Last dialysis was 5 days ago , she is a difficult access and dialyzed via left permacath.  ED visit noted 10/3, chronic left upper extremity swelling, duplex was negative.  Son also reports sacral decubitus , she was noted to desaturate today and sent to the emergency room, requiring 2 L oxygen.  Initial blood pressure was 734 systolic but then dropped to the 80s.  On my arrival again 103/88.  Labs showed WBC count of 18 K with left shift, she is noted to have chronic leukocytosis, stable anemia, BUN and creatinine have increased.  Treated empirically for infection although no clear source found.  Despite abx and aggressive care she was unable to wean off pressors.  Patient began having unremitting pain due to bed sores, DJD.  Multiple meetings with patient/family regarding approaching futility of continued dialysis and goals of care.  Family and patient eventually transitioned to a comfort based approach and she passed peacefully with family at bedside.  Pertinent Labs and Studies  Significant Diagnostic Studies CT ABDOMEN PELVIS WO CONTRAST  Result  Date: 05/17/2021 CLINICAL DATA:  Abdominal pain.  Nonlocalized. EXAM: CT ABDOMEN AND PELVIS WITHOUT CONTRAST TECHNIQUE: Multidetector CT imaging of the abdomen and pelvis was performed following the standard protocol without IV contrast. COMPARISON:  04/18/2021 FINDINGS: Lower chest: Large hiatal hernia. There is bibasilar atelectasis, similar to prior. Hepatobiliary: No focal abnormality in the liver on this study without intravenous contrast. Gallbladder surgically absent. No intrahepatic or extrahepatic biliary dilation. Pancreas: No focal mass lesion. No dilatation of the main duct. No intraparenchymal cyst. No peripancreatic edema. Spleen: No splenomegaly. No focal mass lesion. Adrenals/Urinary Tract: No adrenal nodule or mass. Kidneys unremarkable. No evidence for hydroureter. Bladder is decompressed. Stomach/Bowel: Large hiatal hernia with nearly the entire stomach contained in the chest. Duodenum is normally positioned as is the ligament of Treitz. No small bowel wall thickening. No small bowel dilatation. The terminal ileum is normal. E The appendix is normal. Diverticuli are seen scattered along the entire length of the colon without CT findings of diverticulitis. There is some asymmetric wall thickening in the descending colon (39/2) and in the proximal sigmoid colon (51/2). Vascular/Lymphatic: There is moderate atherosclerotic calcification of the abdominal aorta without aneurysm. There is no gastrohepatic or hepatoduodenal ligament lymphadenopathy. No retroperitoneal or mesenteric lymphadenopathy. No pelvic sidewall lymphadenopathy. Reproductive: Hysterectomy.  There is no adnexal mass. Other: No intraperitoneal free fluid. Musculoskeletal: Bones are diffusely demineralized. Lumbar fusion hardware evident. No worrisome lytic or sclerotic osseous abnormality. L1 compression fracture is stable in the interval. Diffuse soft tissue edema noted in the abdomen and pelvis, left greater than right. Given the  asymmetry, correlation for potential cellulitis recommended. Gas bubbles in the right paramidline anterior  abdominal wall likely related to an injection site. IMPRESSION: 1. Body wall edema in the abdomen and upper pelvis is asymmetric to the left. As such, correlation for cellulitis or hemorrhage suggested. 2. Asymmetric wall thickening in the descending and proximal sigmoid colon. While this may be related to underdistention, correlation with colorectal cancer screening history recommended. Infectious/inflammatory colitis is considered unlikely but not entirely excluded. 3. Large hiatal hernia with nearly the entire stomach contained in the chest. 4. Diffuse colonic diverticulosis without diverticulitis. 5. Stable L1 compression fracture. 6. Aortic Atherosclerosis (ICD10-I70.0). Electronically Signed   By: Misty Stanley M.D.   On: 05/17/2021 17:25   CT CHEST W CONTRAST  Result Date: 05/07/2021 CLINICAL DATA:  Arm deep vein thrombosis suspected. Left arm swelling. EXAM: CT CHEST WITH CONTRAST TECHNIQUE: Multidetector CT imaging of the chest was performed during intravenous contrast administration. CONTRAST:  140mL OMNIPAQUE IOHEXOL 350 MG/ML SOLN COMPARISON:  CT chest 07/16/2006 FINDINGS: Cardiovascular: No acute vascular findings. Normal heart size. No pericardial effusion. Coronary artery calcifications are present. There are atherosclerotic calcifications of the aorta. Left-sided dual lumen catheter tip ends in the SVC. Mediastinum/Nodes: There are no enlarged mediastinal or hilar lymph nodes. The esophagus is nondilated. There is a large hiatal hernia with intrathoracic stomach. Hernia has increased when compared to the prior study. Visualized thyroid gland is within normal limits. Lungs/Pleura: There are 2 new pulmonary nodules measuring up to 4 mm in the right middle lobe images 4/102 and 103. Other scattered nodules measuring 4 mm or less appear similar to the prior study. There is compressive  atelectasis of the bilateral lower lobes. There may be some airspace disease with air bronchograms in the right lower lobe as well. There is no pleural effusion or pneumothorax. Trachea and central airways are patent. Upper Abdomen: Cholecystectomy clips are present. Musculoskeletal: There is soft tissue edema at the level of the left elbow. No enlarged lymph nodes are identified. IMPRESSION: 1. Bilateral lower lobe atelectasis. Additional small amount of airspace disease in the right lower lobe worrisome for pneumonia. 2. Large hiatal hernia with intrathoracic stomach which has increased in size. 3. Multiple new pulmonary nodules. Most severe: 4 mm right solid pulmonary nodule. No routine follow-up imaging is recommended per Fleischner Society Guidelines. These guidelines do not apply to immunocompromised patients and patients with cancer. Follow up in patients with significant comorbidities as clinically warranted. For lung cancer screening, adhere to Lung-RADS guidelines. Reference: Radiology. 2017; 284(1):228-43. 4. Left elbow soft tissue edema. 5.  Aortic Atherosclerosis (ICD10-I70.0).  At Electronically Signed   By: Ronney Asters M.D.   On: 05/07/2021 22:26   US Venous Img Upper Uni Left (DVT)  Result Date: 05/08/2021 CLINICAL DATA:  Left upper extremity pain and edema for 2 days EXAM: LEFT UPPER EXTREMITY VENOUS DOPPLER ULTRASOUND TECHNIQUE: Gray-scale sonography with graded compression, as well as color Doppler and duplex ultrasound were performed to evaluate the upper extremity deep venous system from the level of the subclavian vein and including the jugular, axillary, basilic, radial, ulnar and upper cephalic vein. Spectral Doppler was utilized to evaluate flow at rest and with distal augmentation maneuvers. COMPARISON:  None. FINDINGS: Contralateral Subclavian Vein: Respiratory phasicity is normal and symmetric with the symptomatic side. No evidence of thrombus. Normal compressibility. Internal  Jugular Vein: No evidence of thrombus. Normal compressibility, respiratory phasicity and response to augmentation. Subclavian Vein: No evidence of thrombus. Normal compressibility, respiratory phasicity and response to augmentation. Axillary Vein: No evidence of thrombus. Normal compressibility, respiratory phasicity and  response to augmentation. Cephalic Vein: No evidence of thrombus. Normal compressibility, respiratory phasicity and response to augmentation. Basilic Vein: No evidence of thrombus. Normal compressibility, respiratory phasicity and response to augmentation. Brachial Veins: No evidence of thrombus. Normal compressibility, respiratory phasicity and response to augmentation. Radial Veins: No evidence of thrombus. Normal compressibility, respiratory phasicity and response to augmentation. Ulnar Veins: No evidence of thrombus. Normal compressibility, respiratory phasicity and response to augmentation. Venous Reflux:  None visualized. Other Findings:  None visualized. IMPRESSION: No evidence of DVT within the left upper extremity. Electronically Signed   By: Miachel Roux M.D.   On: 05/08/2021 10:22   DG Chest Port 1 View  Result Date: 05/07/2021 CLINICAL DATA:  Shortness of breath EXAM: PORTABLE CHEST 1 VIEW COMPARISON:  05/17/2021 FINDINGS: Left IJ approach hemodialysis catheter is stable in positioning. Cardiomegaly. Atherosclerotic calcification of the aortic knob. Large hiatal hernia with associated bibasilar atelectasis. Small left pleural effusion. Probable skin fold projects at the periphery of the right lung base where there are lung markings seen extending beyond the area of vertical lucency. No evidence of pneumothorax. IMPRESSION: 1. Small left pleural effusion and bibasilar atelectasis. 2. Large hiatal hernia. 3. Probable skin fold projects at the periphery of the right lung base where there are lung markings seen extending beyond the area of vertical lucency. Electronically Signed   By:  Davina Poke D.O.   On: 05/28/2021 13:03   DG Chest Portable 1 View  Result Date: 05/17/2021 CLINICAL DATA:  Weakness EXAM: PORTABLE CHEST 1 VIEW COMPARISON:  04/13/2021 FINDINGS: Cardiomegaly. Large hiatal hernia. New small left pleural effusion. Large-bore left neck multi lumen vascular catheter. IMPRESSION: 1.  New small left pleural effusion. 2.  Cardiomegaly. 3.  Large hiatal hernia. Electronically Signed   By: Delanna Ahmadi M.D.   On: 05/17/2021 16:20    Microbiology Recent Results (from the past 240 hour(s))  Culture, blood (routine x 2)     Status: None   Collection Time: 05/27/2021  4:48 PM   Specimen: Blood  Result Value Ref Range Status   Specimen Description RIGHT ANTECUBITAL  Final   Special Requests   Final    BOTTLES DRAWN AEROBIC AND ANAEROBIC Blood Culture results may not be optimal due to an excessive volume of blood received in culture bottles   Culture   Final    NO GROWTH 5 DAYS Performed at [facility], [address] [place], [place] [address]    Report Status 05/27/2021 FINAL  Final  MRSA Next Gen by PCR, Nasal     Status: None   Collection Time: 06/04/2021  7:00 PM   Specimen: Nasal Mucosa; Nasal Swab  Result Value Ref Range Status   MRSA by PCR Next Gen NOT DETECTED NOT DETECTED Final    Comment: (NOTE) The GeneXpert MRSA Assay (FDA approved for NASAL specimens only), is one component of a comprehensive MRSA colonization surveillance program. It is not intended to diagnose MRSA infection nor to guide or monitor treatment for MRSA infections. Test performance is not FDA approved in patients less than 48 years old. Performed at [facility], [address] [place], [place] [address]   Culture, blood (routine x 2)     Status: None   Collection Time: 05/23/21 12:35 AM   Specimen: Blood  Result Value Ref Range Status   Specimen Description BLOOD BLOOD RIGHT HAND  Final   Special Requests   Final    BOTTLES DRAWN AEROBIC AND ANAEROBIC Blood Culture  adequate volume  Culture   Final    NO GROWTH 5 DAYS Performed at [facility], [address] [place], [place] [address]    Report Status 05/28/2021 FINAL  Final  Resp Panel by RT-PCR (Flu A&B, Covid) Nasopharyngeal Swab     Status: None   Collection Time: 05/23/21  5:10 PM   Specimen: Nasopharyngeal Swab; Nasopharyngeal(NP) swabs in vial transport medium  Result Value Ref Range Status   SARS Coronavirus 2 by RT PCR NEGATIVE NEGATIVE Final    Comment: (NOTE) SARS-CoV-2 target nucleic acids are NOT DETECTED.  The SARS-CoV-2 RNA is generally detectable in upper respiratory specimens during the acute phase of infection. The lowest concentration of SARS-CoV-2 viral copies this assay can detect is 138 copies/mL. A negative result does not preclude SARS-Cov-2 infection and should not be used as the sole basis for treatment or other patient management decisions. A negative result may occur with  improper specimen collection/handling, submission of specimen other than nasopharyngeal swab, presence of viral mutation(s) within the areas targeted by this assay, and inadequate number of viral copies(<138 copies/mL). A negative result must be combined with clinical observations, patient history, and epidemiological information. The expected result is Negative.  Fact Sheet for Patients:  EntrepreneurPulse.com.au  Fact Sheet for Healthcare Providers:  IncredibleEmployment.be  This test is no t yet approved or cleared by the [place] FDA and  has been authorized for detection and/or diagnosis of SARS-CoV-2 by FDA under an Emergency Use Authorization (EUA). This EUA will remain  in effect (meaning this test can be used) for the duration of the COVID-19 declaration under Section 564(b)(1) of the Act, 21 U.S.C.section 360bbb-3(b)(1), unless the authorization is terminated  or revoked sooner.       Influenza A by PCR NEGATIVE NEGATIVE Final    Influenza B by PCR NEGATIVE NEGATIVE Final    Comment: (NOTE) The Xpert Xpress SARS-CoV-2/FLU/RSV plus assay is intended as an aid in the diagnosis of influenza from Nasopharyngeal swab specimens and should not be used as a sole basis for treatment. Nasal washings and aspirates are unacceptable for Xpert Xpress SARS-CoV-2/FLU/RSV testing.  Fact Sheet for Patients: EntrepreneurPulse.com.au  Fact Sheet for Healthcare Providers: IncredibleEmployment.be  This test is not yet approved or cleared by the [place] FDA and has been authorized for detection and/or diagnosis of SARS-CoV-2 by FDA under an Emergency Use Authorization (EUA). This EUA will remain in effect (meaning this test can be used) for the duration of the COVID-19 declaration under Section 564(b)(1) of the Act, 21 U.S.C. section 360bbb-3(b)(1), unless the authorization is terminated or revoked.  Performed at [facility], [address], [place], [place] [address]     Lab Basic Metabolic Panel: Recent Labs  Lab 05/26/21 0500 05/27/21 0838 05/28/21 0439 05/29/21 0410 Jun 17, 2021 0521  NA 135 133* 134* 137 136  K 3.1* 3.8 4.0 3.7 4.1  CL 109 103 101 103 103  CO2 20* 21* 23 22 20*  GLUCOSE 109* 140* 173* 127* 157*  BUN 15 23 9 17  28*  CREATININE 3.50* 4.73* 2.88* 3.92* 4.95*  CALCIUM 7.2* 8.7* 8.3* 8.3* 8.5*  PHOS 1.5* 1.8* 1.9* 4.1 4.6   Liver Function Tests: Recent Labs  Lab 05/26/21 0500 05/27/21 0838 05/28/21 0439 05/29/21 0410 06/17/21 0521  ALBUMIN 1.6* 2.0* 2.1* 1.8* 1.9*   No results for input(s): LIPASE, AMYLASE in the last 168 hours. No results for input(s): AMMONIA in the last 168 hours. CBC: Recent Labs  Lab 05/26/21 0407 05/27/21 3710 05/28/21 0439 05/29/21  0410 05/29/21 1318 18-Jun-2021 0521  WBC 10.4 14.4* 13.8* 14.9* 16.1* 15.4*  NEUTROABS 9.0* 12.5* 11.9* 12.7*  --  13.2*  HGB 8.3* 8.5* 9.0* 8.3* 8.6* 8.8*  HCT 25.4* 26.2* 27.7* 25.3* 25.7*  27.3*  MCV 91.4 91.3 90.5 89.4 88.9 90.7  PLT 142* 196 188 201 232 259   Cardiac Enzymes: No results for input(s): CKTOTAL, CKMB, CKMBINDEX, TROPONINI in the last 168 hours. Sepsis Labs: Recent Labs  Lab 05/28/21 0439 05/29/21 0410 05/29/21 1318 2021-06-18 0521  WBC 13.8* 14.9* 16.1* 15.4*      Candee Furbish 05/07/2021, 1:53 PM

## 2021-06-05 NOTE — Progress Notes (Addendum)
This chaplain responded to PMT referral for family EOL spiritual care.    The Pt. family is not at the bedside at the time of the visit.  The chaplain shared her pager number with the RN-Autumn for an update if/when the Pt. family returns to the bedside.  Chaplain Sallyanne Kuster [phone]  [phone]-This chaplain appreciates the RN-Autumn's update on the Pt. son-David's return to the bedside.  [name] agreed, the Pt. Is resting comfortably at the time of the visit. The chaplain listened reflectively as [name] opened the space for story telling.  The chaplain accepted David's invitation for F/U EOL spiritual care and prayer.

## 2021-06-05 NOTE — Progress Notes (Incomplete)
Cardiac time of death June 16, 2021 2113/11/11 confirmed by Antonieta Pert, RN and Yolande Jolly, RN. E-link MD aware. [name] (son) notified and on the way back to hospital. No belongings at bedside.   [place] Referral Number: [id number]

## 2021-06-05 DEATH — deceased

## 2021-06-15 ENCOUNTER — Ambulatory Visit: Payer: Medicare Other | Admitting: Family Medicine

## 2021-06-27 ENCOUNTER — Ambulatory Visit: Payer: Medicare Other | Admitting: Gastroenterology
